# Patient Record
Sex: Male | Born: 1960 | Race: Black or African American | Hispanic: No | Marital: Single | Smoking: Current some day smoker
Health system: Southern US, Community
[De-identification: ages and names within clinical notes are randomized; demographics above are authoritative.]

## PROBLEM LIST (undated history)

## (undated) DIAGNOSIS — I10 Essential (primary) hypertension: Secondary | ICD-10-CM

## (undated) DIAGNOSIS — R7303 Prediabetes: Secondary | ICD-10-CM

## (undated) DIAGNOSIS — F32A Depression, unspecified: Secondary | ICD-10-CM

## (undated) DIAGNOSIS — F17200 Nicotine dependence, unspecified, uncomplicated: Secondary | ICD-10-CM

## (undated) DIAGNOSIS — F329 Major depressive disorder, single episode, unspecified: Secondary | ICD-10-CM

## (undated) DIAGNOSIS — M1990 Unspecified osteoarthritis, unspecified site: Secondary | ICD-10-CM

## (undated) DIAGNOSIS — F419 Anxiety disorder, unspecified: Secondary | ICD-10-CM

## (undated) DIAGNOSIS — J449 Chronic obstructive pulmonary disease, unspecified: Secondary | ICD-10-CM

## (undated) DIAGNOSIS — J302 Other seasonal allergic rhinitis: Secondary | ICD-10-CM

## (undated) DIAGNOSIS — K219 Gastro-esophageal reflux disease without esophagitis: Secondary | ICD-10-CM

## (undated) DIAGNOSIS — F1910 Other psychoactive substance abuse, uncomplicated: Secondary | ICD-10-CM

## (undated) HISTORY — DX: Essential (primary) hypertension: I10

## (undated) HISTORY — PX: HIP SURGERY: SHX245

## (undated) HISTORY — DX: Other seasonal allergic rhinitis: J30.2

## (undated) HISTORY — PX: SHOULDER ARTHROSCOPY: SHX128

## (undated) HISTORY — DX: Prediabetes: R73.03
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
## 2002-10-13 ENCOUNTER — Encounter: Payer: Self-pay | Admitting: Emergency Medicine

## 2002-10-13 ENCOUNTER — Emergency Department (HOSPITAL_COMMUNITY): Admission: EM | Admit: 2002-10-13 | Discharge: 2002-10-13 | Payer: Self-pay | Admitting: Emergency Medicine

## 2002-10-28 DIAGNOSIS — J449 Chronic obstructive pulmonary disease, unspecified: Secondary | ICD-10-CM

## 2002-10-28 HISTORY — DX: Chronic obstructive pulmonary disease, unspecified: J44.9

## 2002-11-24 ENCOUNTER — Encounter: Payer: Self-pay | Admitting: *Deleted

## 2002-11-24 ENCOUNTER — Emergency Department (HOSPITAL_COMMUNITY): Admission: EM | Admit: 2002-11-24 | Discharge: 2002-11-24 | Payer: Self-pay | Admitting: *Deleted

## 2003-06-16 ENCOUNTER — Encounter: Payer: Self-pay | Admitting: Internal Medicine

## 2003-06-16 ENCOUNTER — Encounter: Payer: Self-pay | Admitting: Emergency Medicine

## 2003-06-16 ENCOUNTER — Inpatient Hospital Stay (HOSPITAL_COMMUNITY): Admission: EM | Admit: 2003-06-16 | Discharge: 2003-06-20 | Payer: Self-pay | Admitting: Emergency Medicine

## 2003-06-17 ENCOUNTER — Encounter: Payer: Self-pay | Admitting: Internal Medicine

## 2004-12-11 ENCOUNTER — Emergency Department (HOSPITAL_COMMUNITY): Admission: EM | Admit: 2004-12-11 | Discharge: 2004-12-11 | Payer: Self-pay | Admitting: Emergency Medicine

## 2007-02-16 ENCOUNTER — Emergency Department (HOSPITAL_COMMUNITY): Admission: EM | Admit: 2007-02-16 | Discharge: 2007-02-17 | Payer: Self-pay | Admitting: Emergency Medicine

## 2007-02-16 IMAGING — CR DG WRIST COMPLETE 3+V*R*
3 series · 3 of 3 positions shown · non-contrast
Comparison: none

CLINICAL DATA: Injury. 
 RIGHT WRIST ? 4 VIEW ([DATE] HOURS):

[view not recorded (1 of 3)]
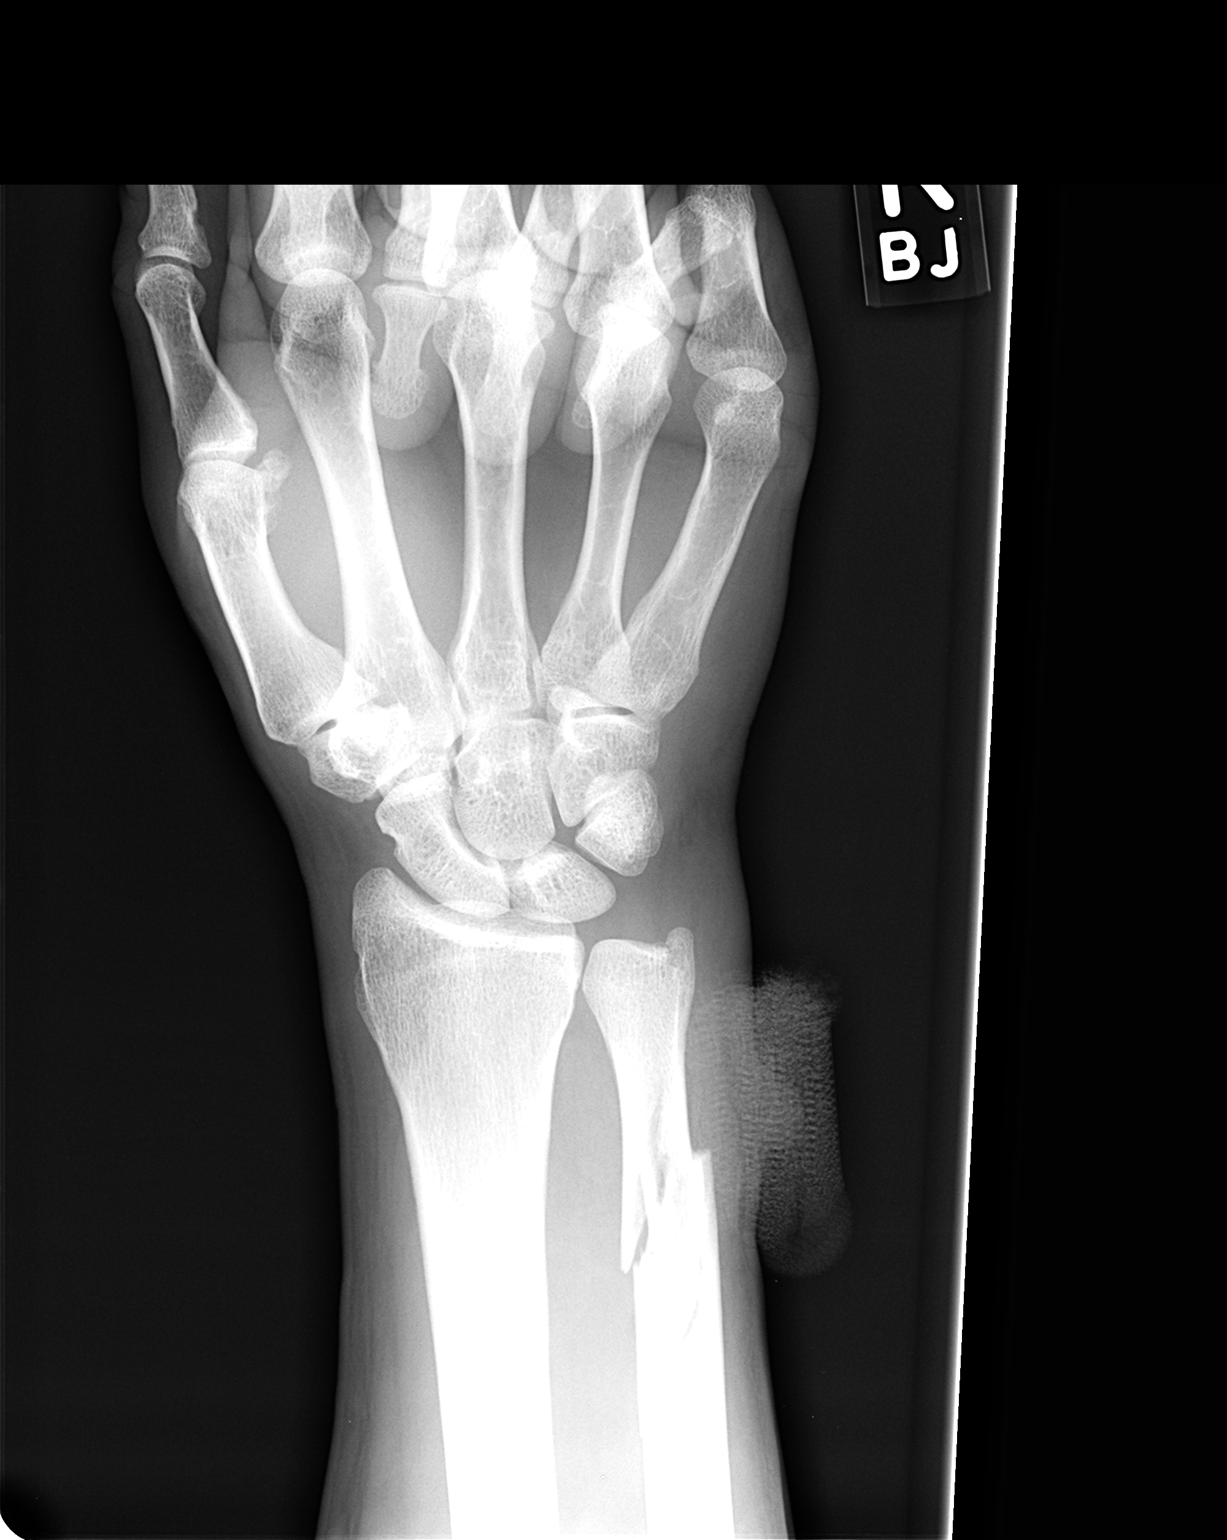

[view not recorded (2 of 3)]
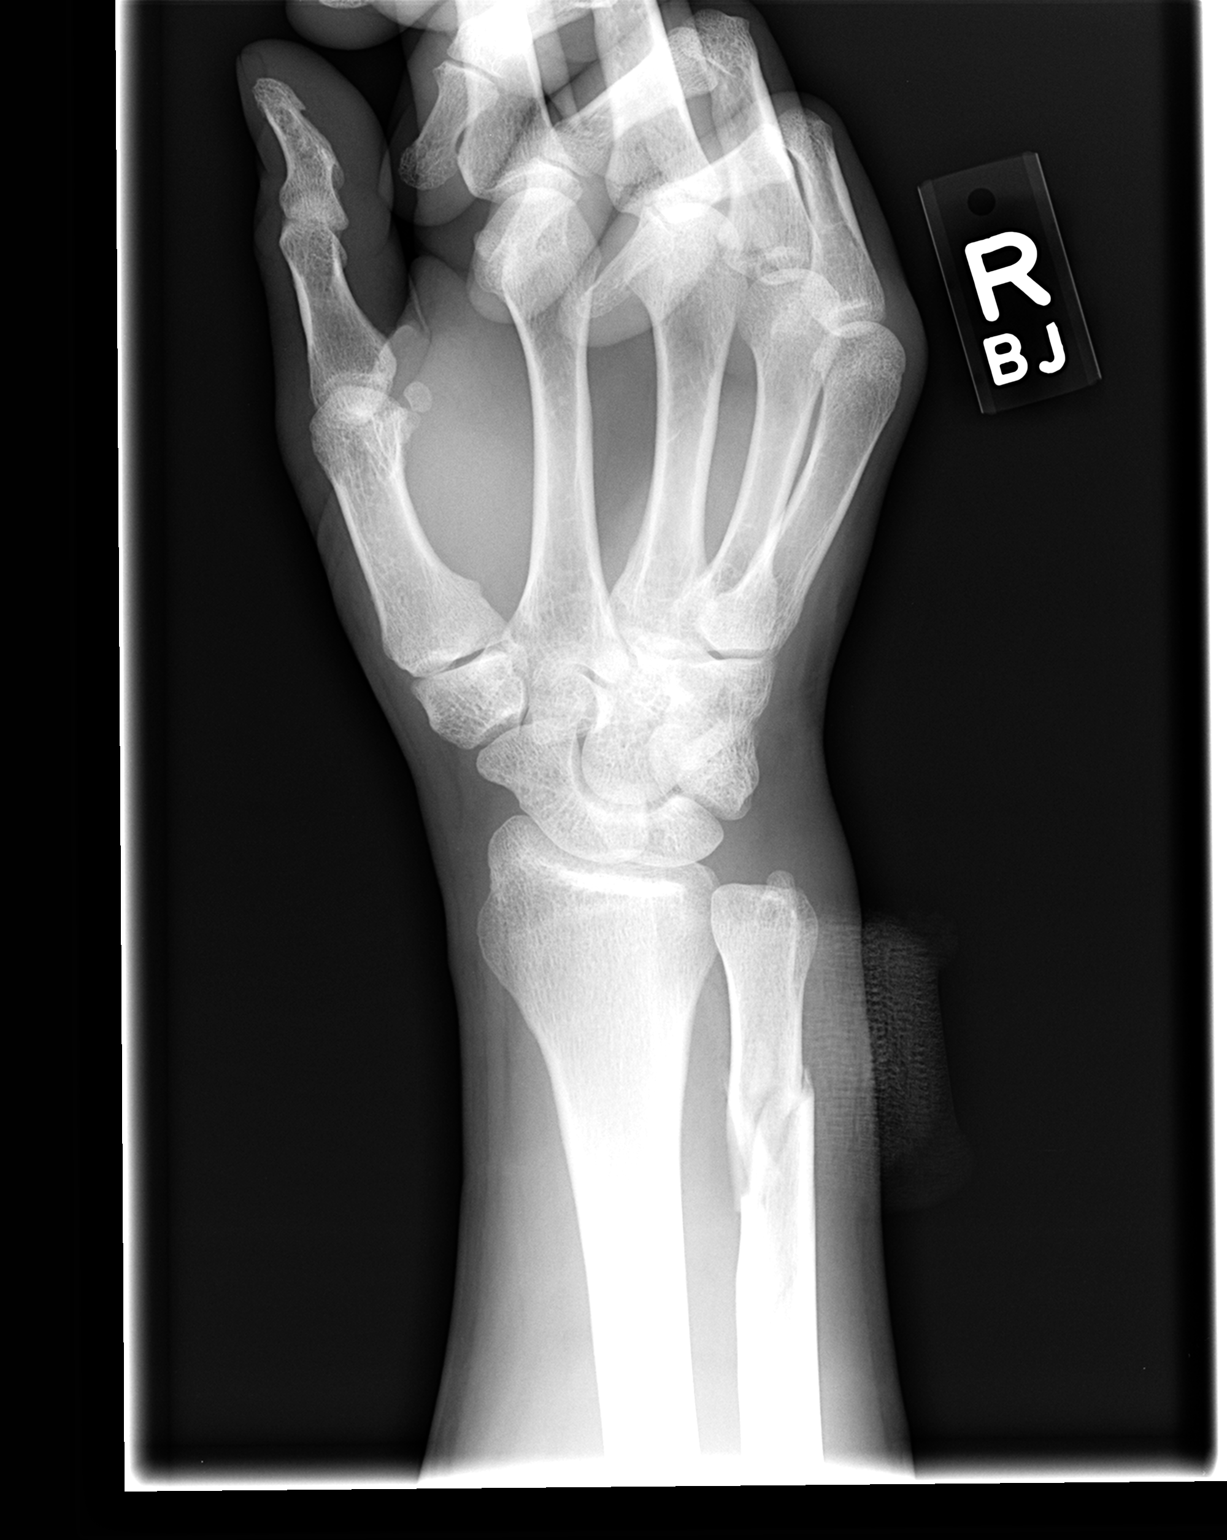

[view not recorded (3 of 3)]
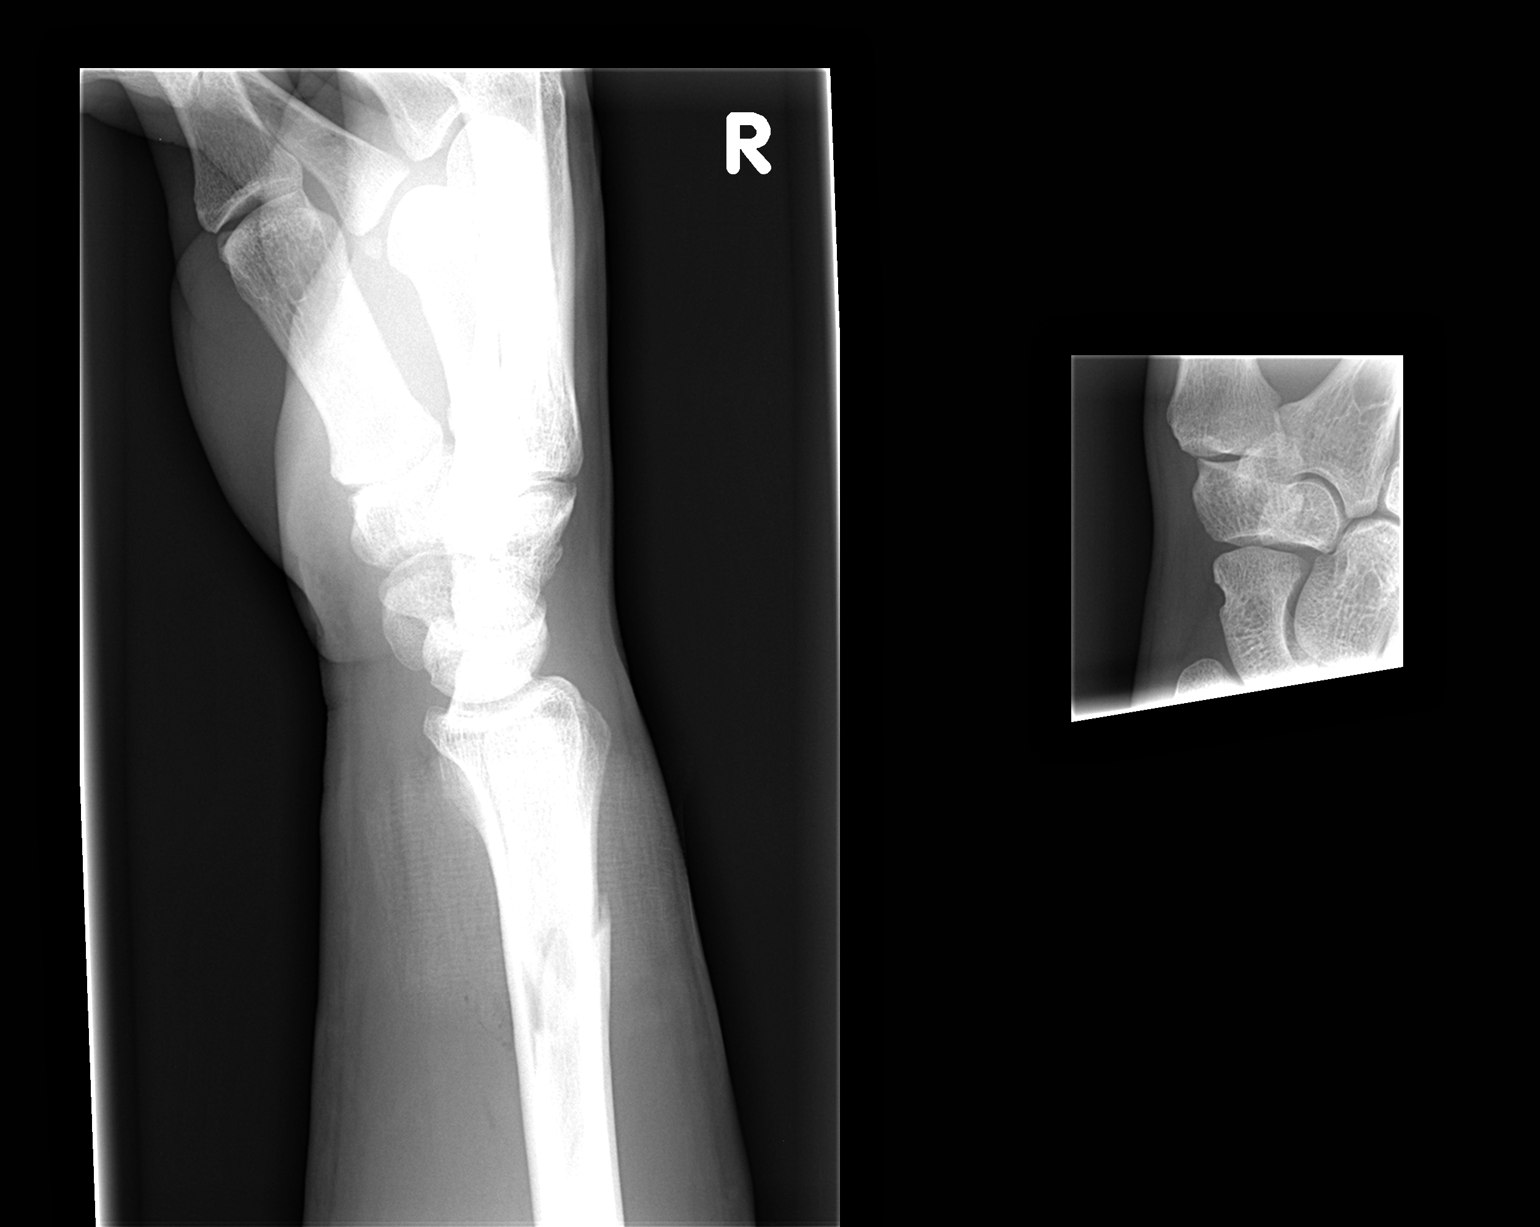

[3 of 3 positions shown; findings below may reference images not displayed]

FINDINGS: There is a comminuted fracture of the distal ulna at the diaphyseal metaphyseal junction.  A small erosion is present on the radial aspect of the scaphoid which has a chronic appearance.  The radius is intact.  No other acute fractures or dislocations are seen.
IMPRESSION: Comminuted ulna fracture.

## 2007-02-19 ENCOUNTER — Emergency Department (HOSPITAL_COMMUNITY): Admission: EM | Admit: 2007-02-19 | Discharge: 2007-02-19 | Payer: Self-pay | Admitting: Family Medicine

## 2007-02-24 ENCOUNTER — Emergency Department (HOSPITAL_COMMUNITY): Admission: EM | Admit: 2007-02-24 | Discharge: 2007-02-24 | Payer: Self-pay | Admitting: Orthopedic Surgery

## 2007-02-24 ENCOUNTER — Ambulatory Visit: Payer: Self-pay | Admitting: Orthopedic Surgery

## 2008-04-05 ENCOUNTER — Emergency Department (HOSPITAL_COMMUNITY): Admission: EM | Admit: 2008-04-05 | Discharge: 2008-04-05 | Payer: Self-pay | Admitting: Emergency Medicine

## 2008-04-05 IMAGING — CR DG CHEST 2V
2 series · 2 of 2 positions shown · non-contrast
Comparison: None.

CLINICAL DATA: 46-year-old male chest pain, hypertension,
emphysema, shortness of breath and history smoking.

CHEST - 2 VIEW

[view not recorded (1 of 2)]
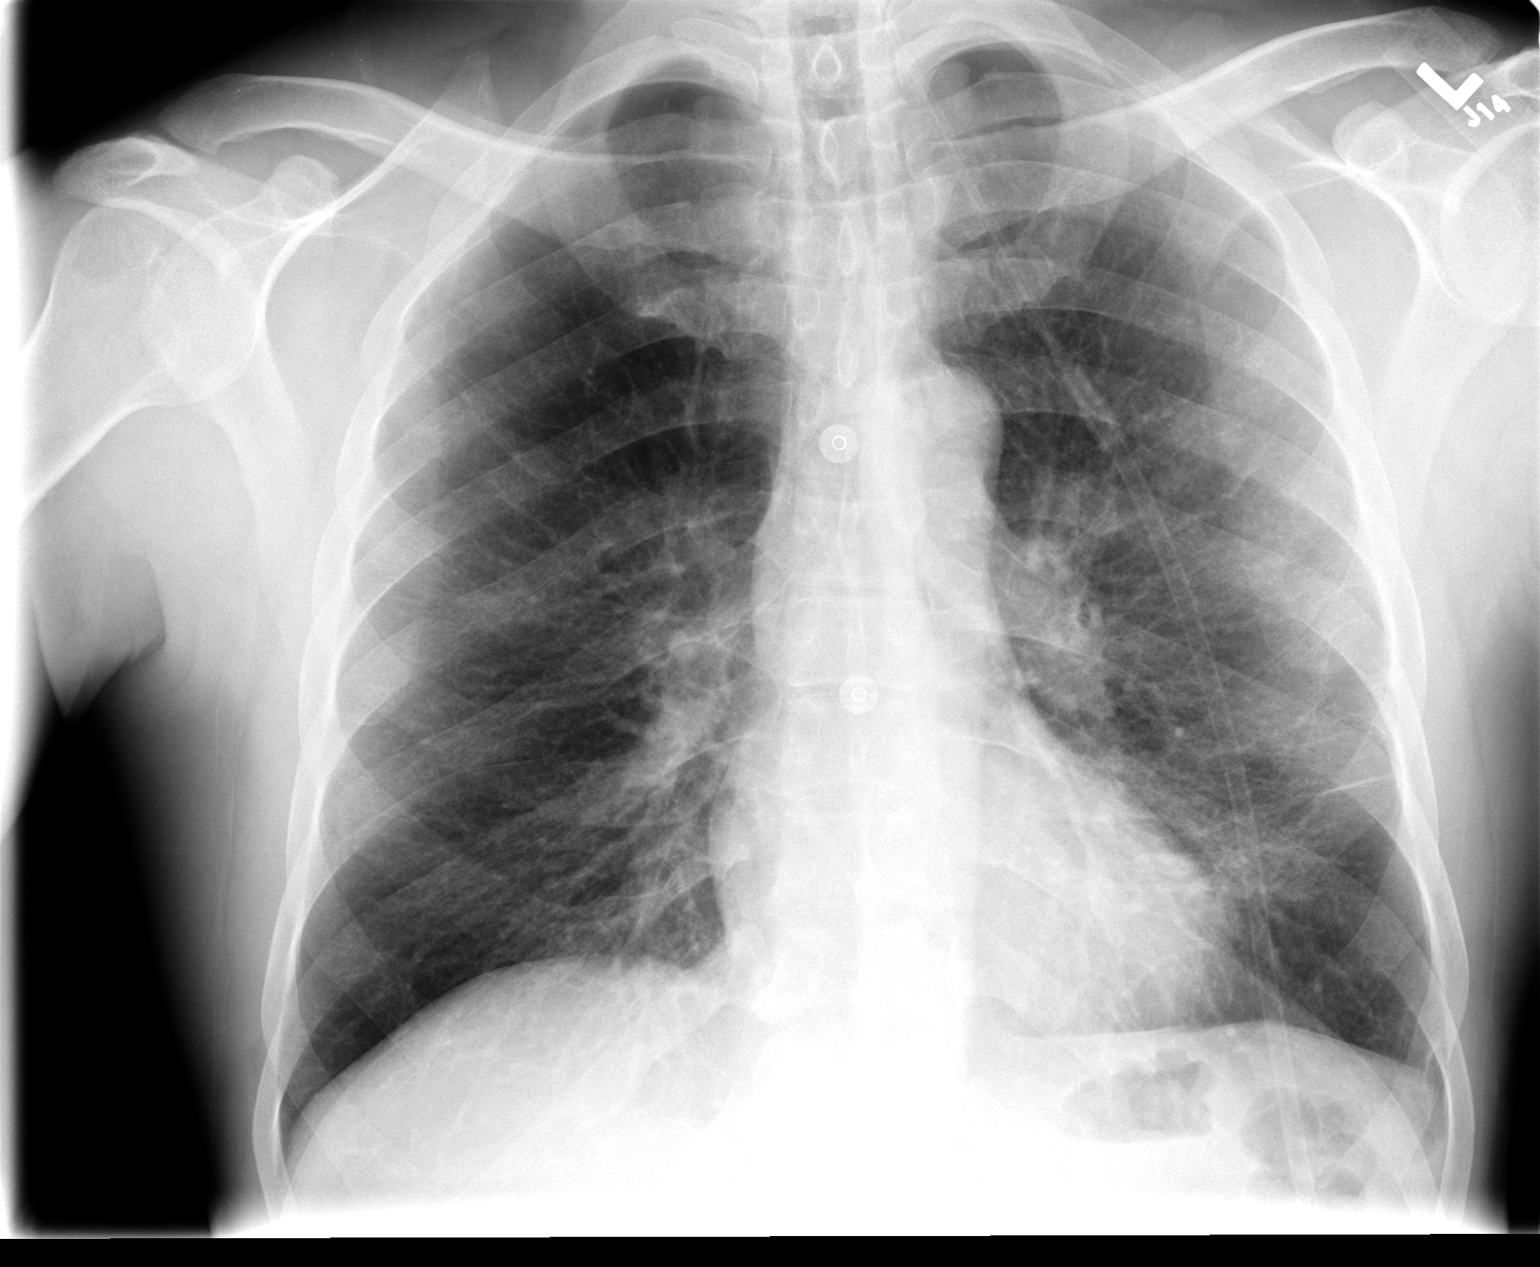

[view not recorded (2 of 2)]
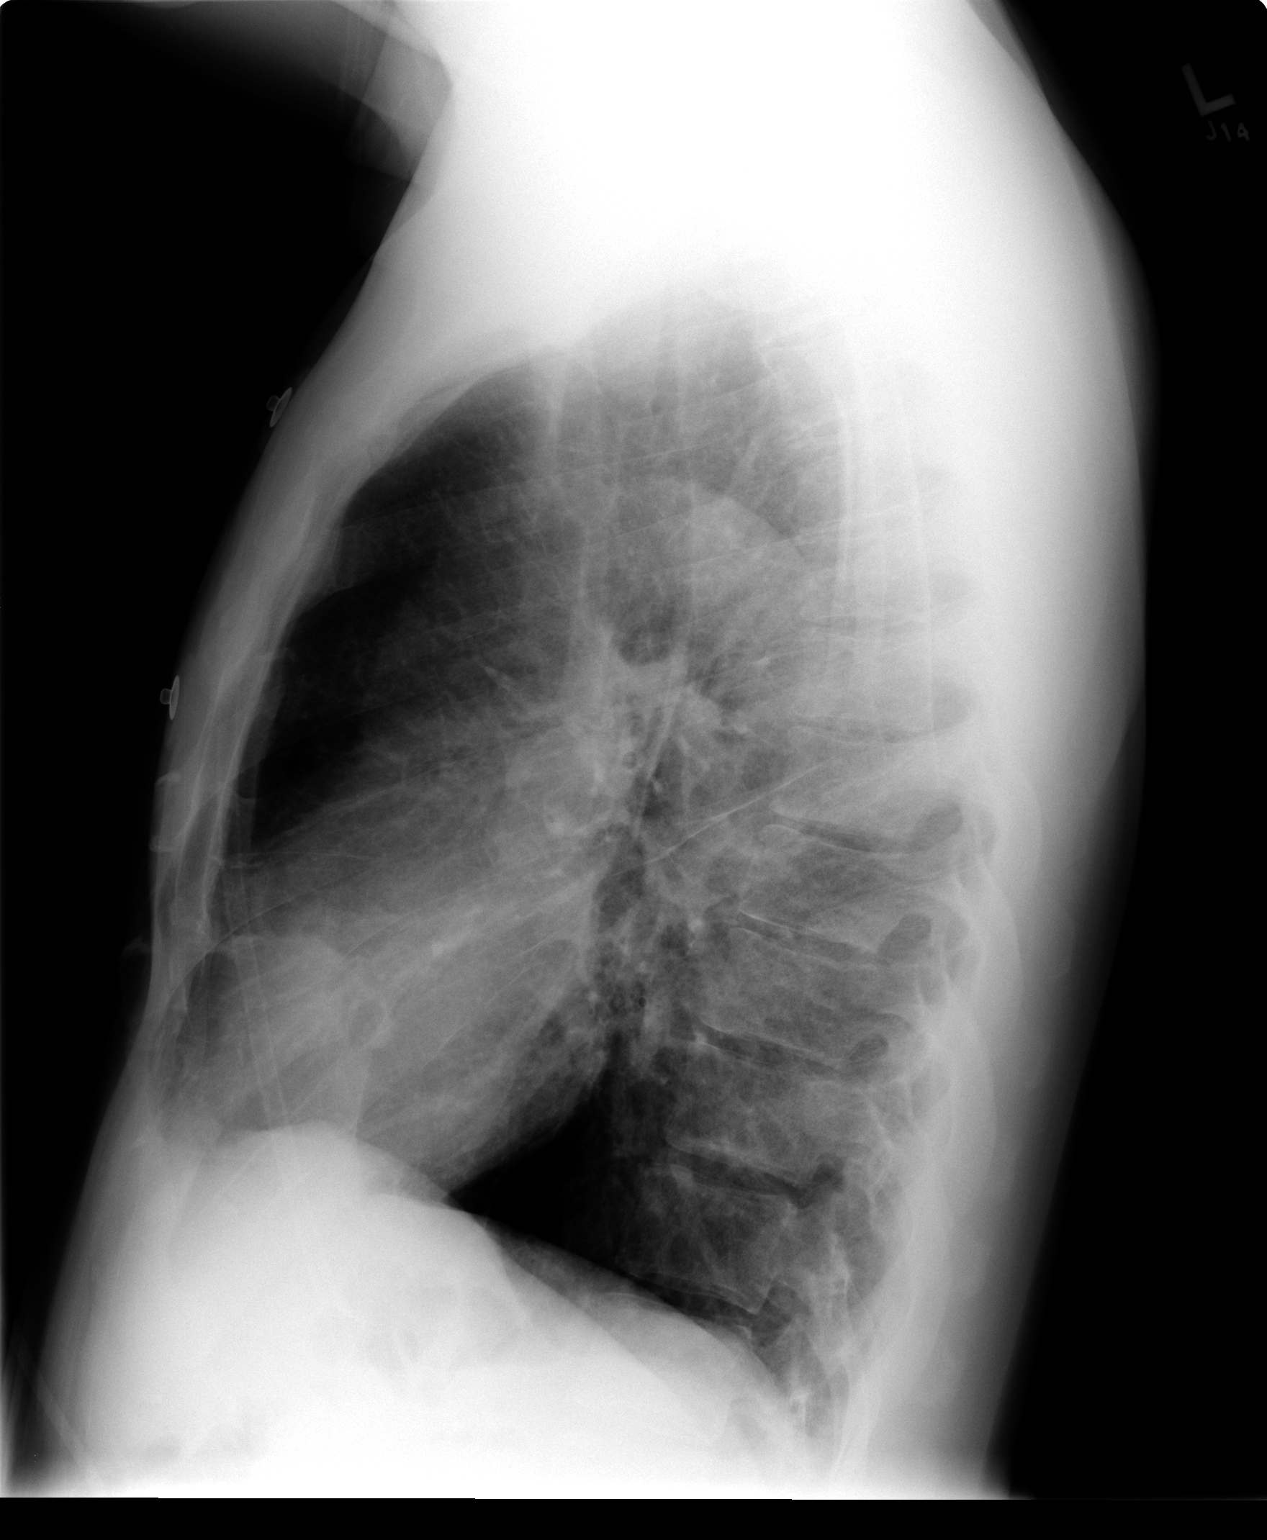

[2 of 2 positions shown; findings below may reference images not displayed]

FINDINGS: Ill-defined patchy increased density is present in the
lingula suspicious for atelectasis versus developing infiltrate or
pneumonia.  Right lung appears clear.  No effusion and or
pneumothorax.  Trachea is midline.  Normal heart size and
vascularity. There is mild hyperinflation of the chest.  Left
midlung subpleural scarring is also noted.a
IMPRESSION: Lingula atelectasis verses airspace disease.

Mild hyperinflation suspicious for COPD.

Left midlung scarring.

## 2009-01-23 ENCOUNTER — Emergency Department (HOSPITAL_COMMUNITY): Admission: EM | Admit: 2009-01-23 | Discharge: 2009-01-23 | Payer: Self-pay | Admitting: Emergency Medicine

## 2009-03-20 ENCOUNTER — Emergency Department (HOSPITAL_COMMUNITY): Admission: EM | Admit: 2009-03-20 | Discharge: 2009-03-20 | Payer: Self-pay | Admitting: Emergency Medicine

## 2009-03-20 IMAGING — CT CT HEAD W/O CM
1 series · 16 of 30 positions shown, 20 images · non-contrast
Comparison: None available.

CLINICAL DATA: Headache.

CT HEAD WITHOUT CONTRAST
TECHNIQUE: Contiguous axial images were obtained from the base of
the skull through the vertex without contrast.

[Series 2: headseq 4.8 h37s · axial · 0.43mm/px · z∈[+1199,+1359]mm · 16 of 36 slices shown, 20 images]
[im 2/36  brain]
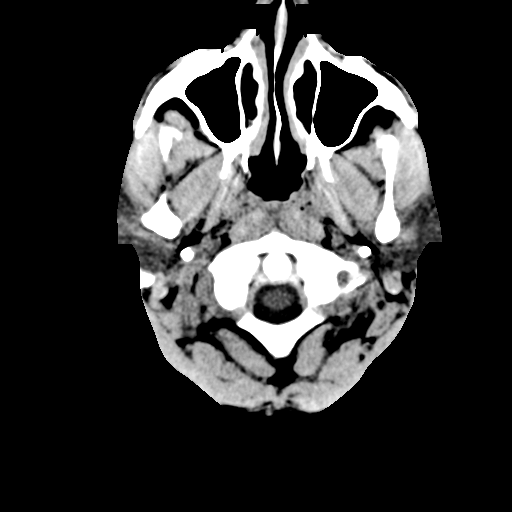
[im 2/36  bone]
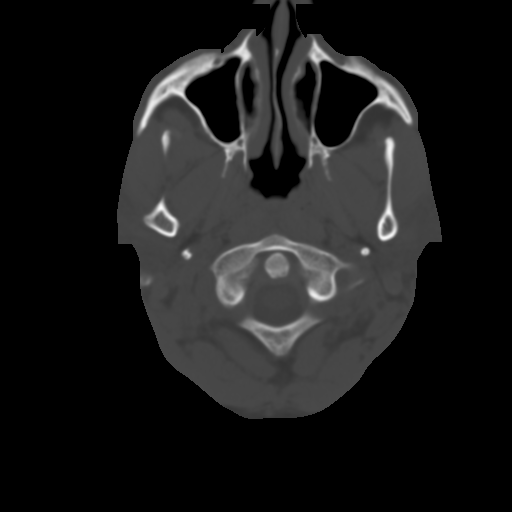
[im 4/36  brain]
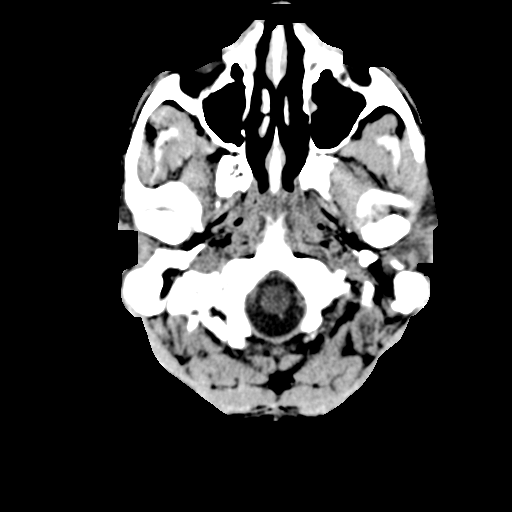
[im 7/36  brain]
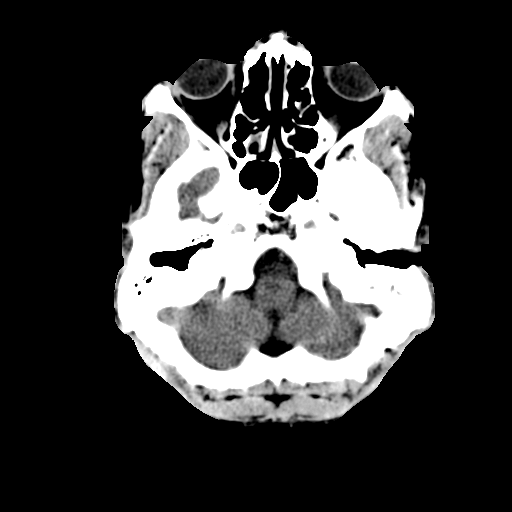
[im 9/36  brain]
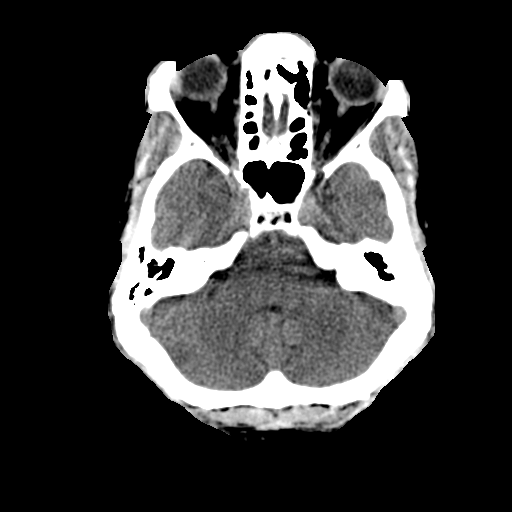
[im 10/36  brain]
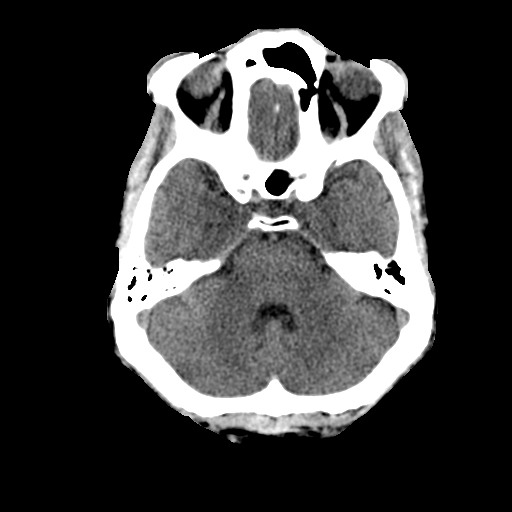
[im 10/36  bone]
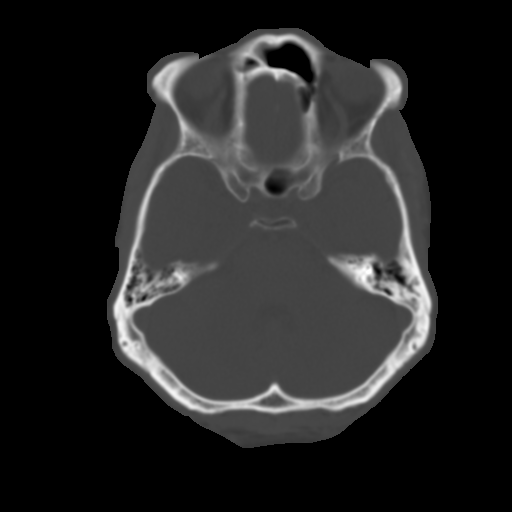
[im 13/36  brain]
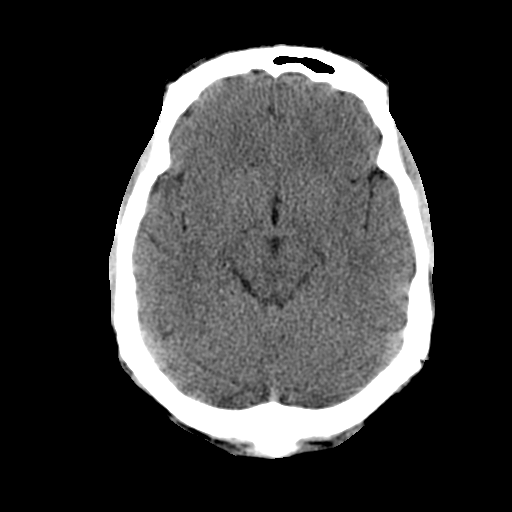
[im 15/36  brain]
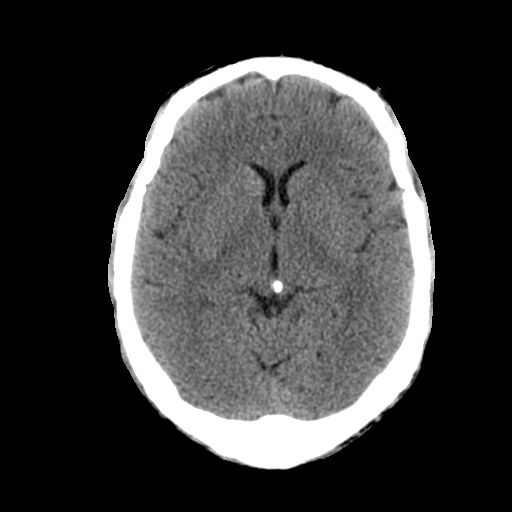
[im 17/36  brain]
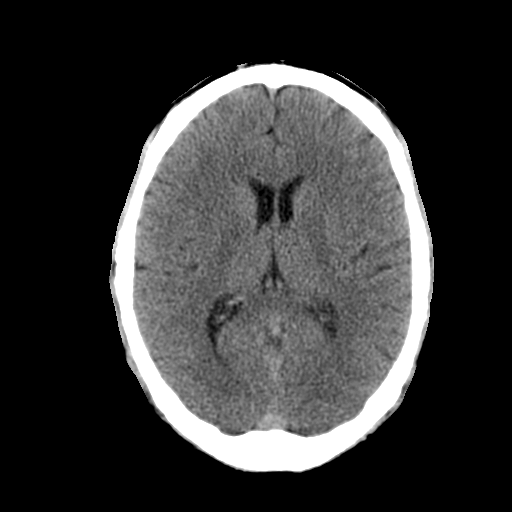
[im 19/36  brain]
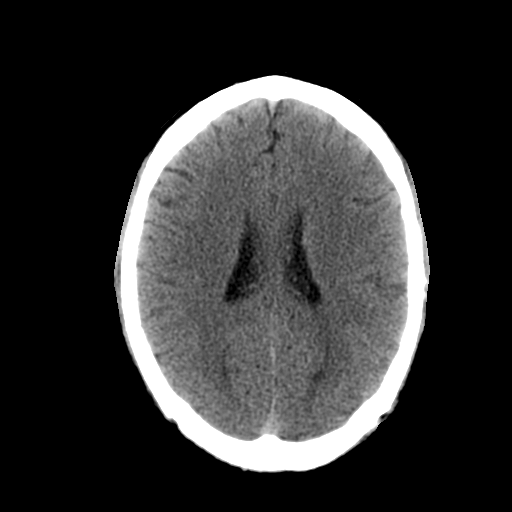
[im 19/36  bone]
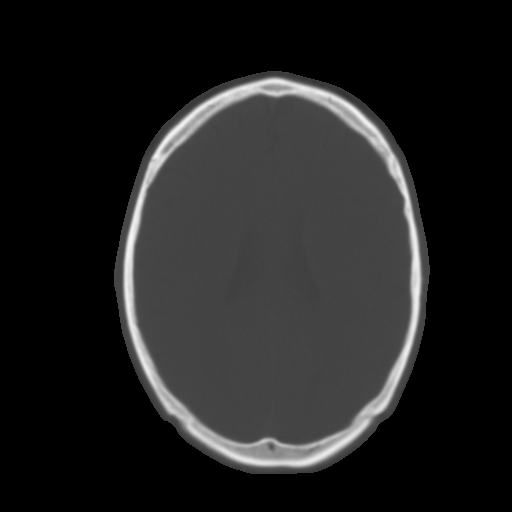
[im 21/36  brain]
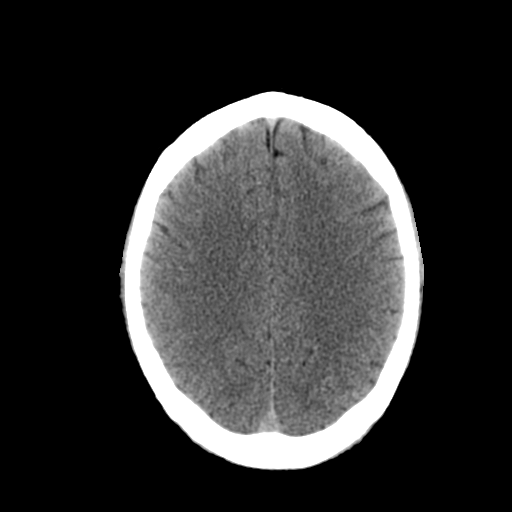
[im 23/36  brain]
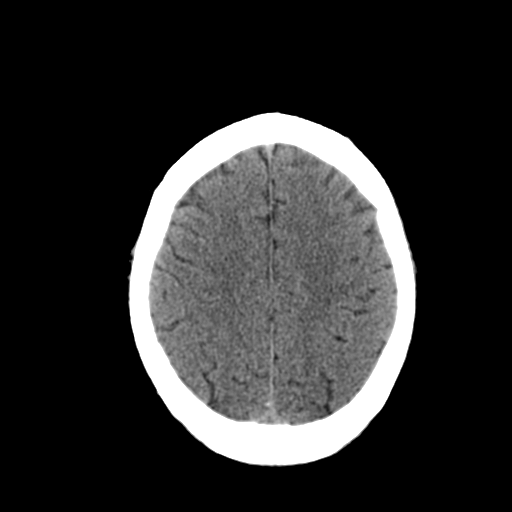
[im 26/36  brain]
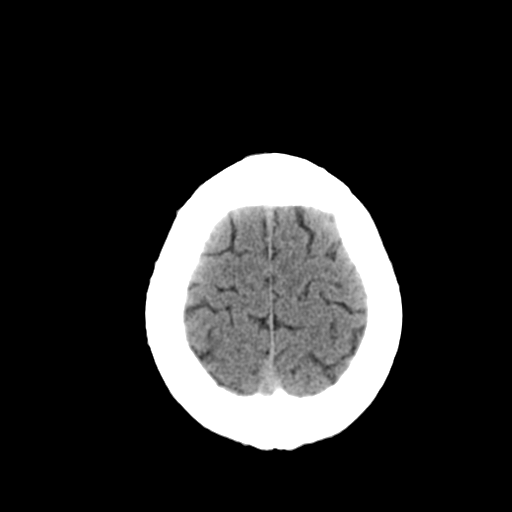
[im 27/36  brain]
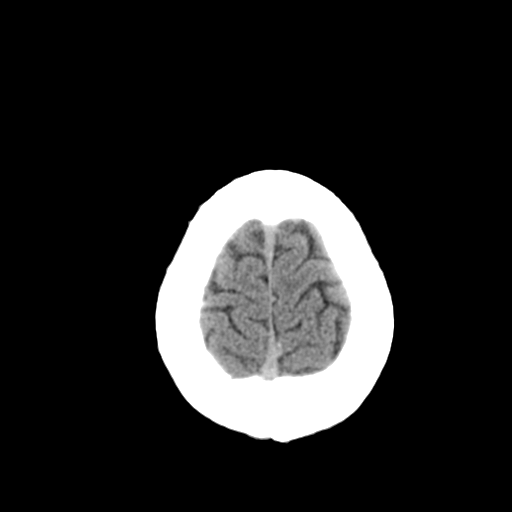
[im 27/36  bone]
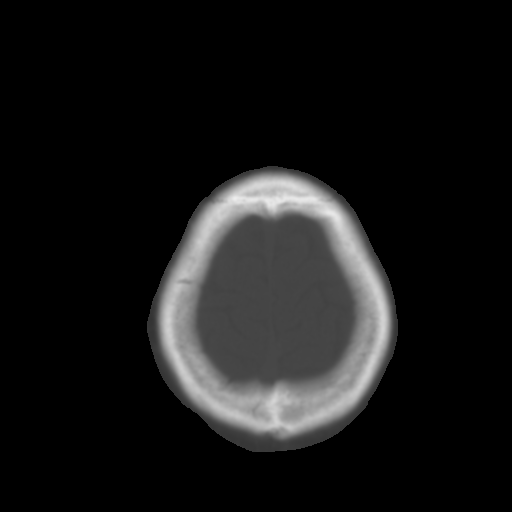
[im 29/36  brain]
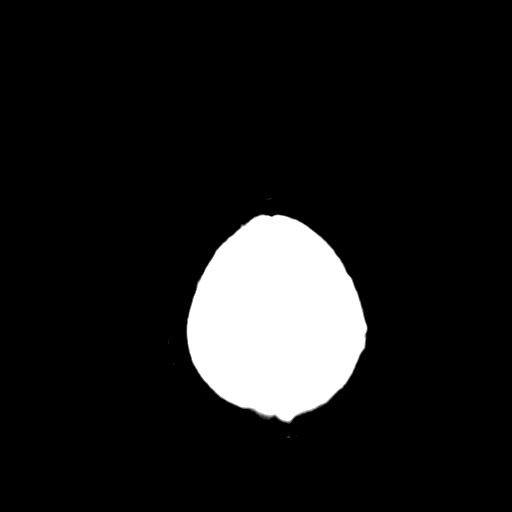
[im 32/36  brain]
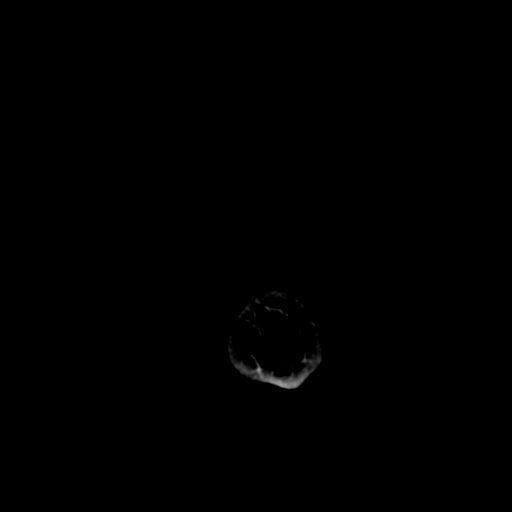
[im 34/36  brain]
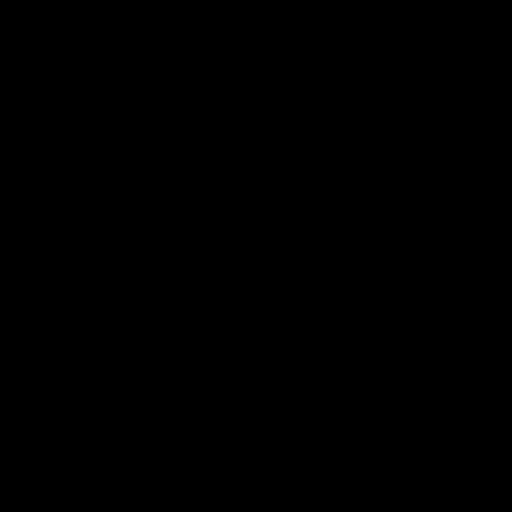

[16 of 30 positions shown; findings below may reference images not displayed]

FINDINGS: The brain appears normal without evidence of acute
infarction, hemorrhage, mass lesion, mass effect, midline shift or
abnormal extra-axial fluid collection.  No hydrocephalus.
Scattered ethmoid air cell disease noted.
IMPRESSION: No acute intracranial abnormality.

## 2009-08-19 ENCOUNTER — Emergency Department (HOSPITAL_COMMUNITY): Admission: EM | Admit: 2009-08-19 | Discharge: 2009-08-19 | Payer: Self-pay | Admitting: Emergency Medicine

## 2009-08-19 IMAGING — CR DG CHEST 2V
2 series · 2 of 2 positions shown · non-contrast
Comparison: [DATE]

CLINICAL DATA: Shortness of breath, cough, congestion, fever,
history emphysema

CHEST - 2 VIEW

[view not recorded (1 of 2)]
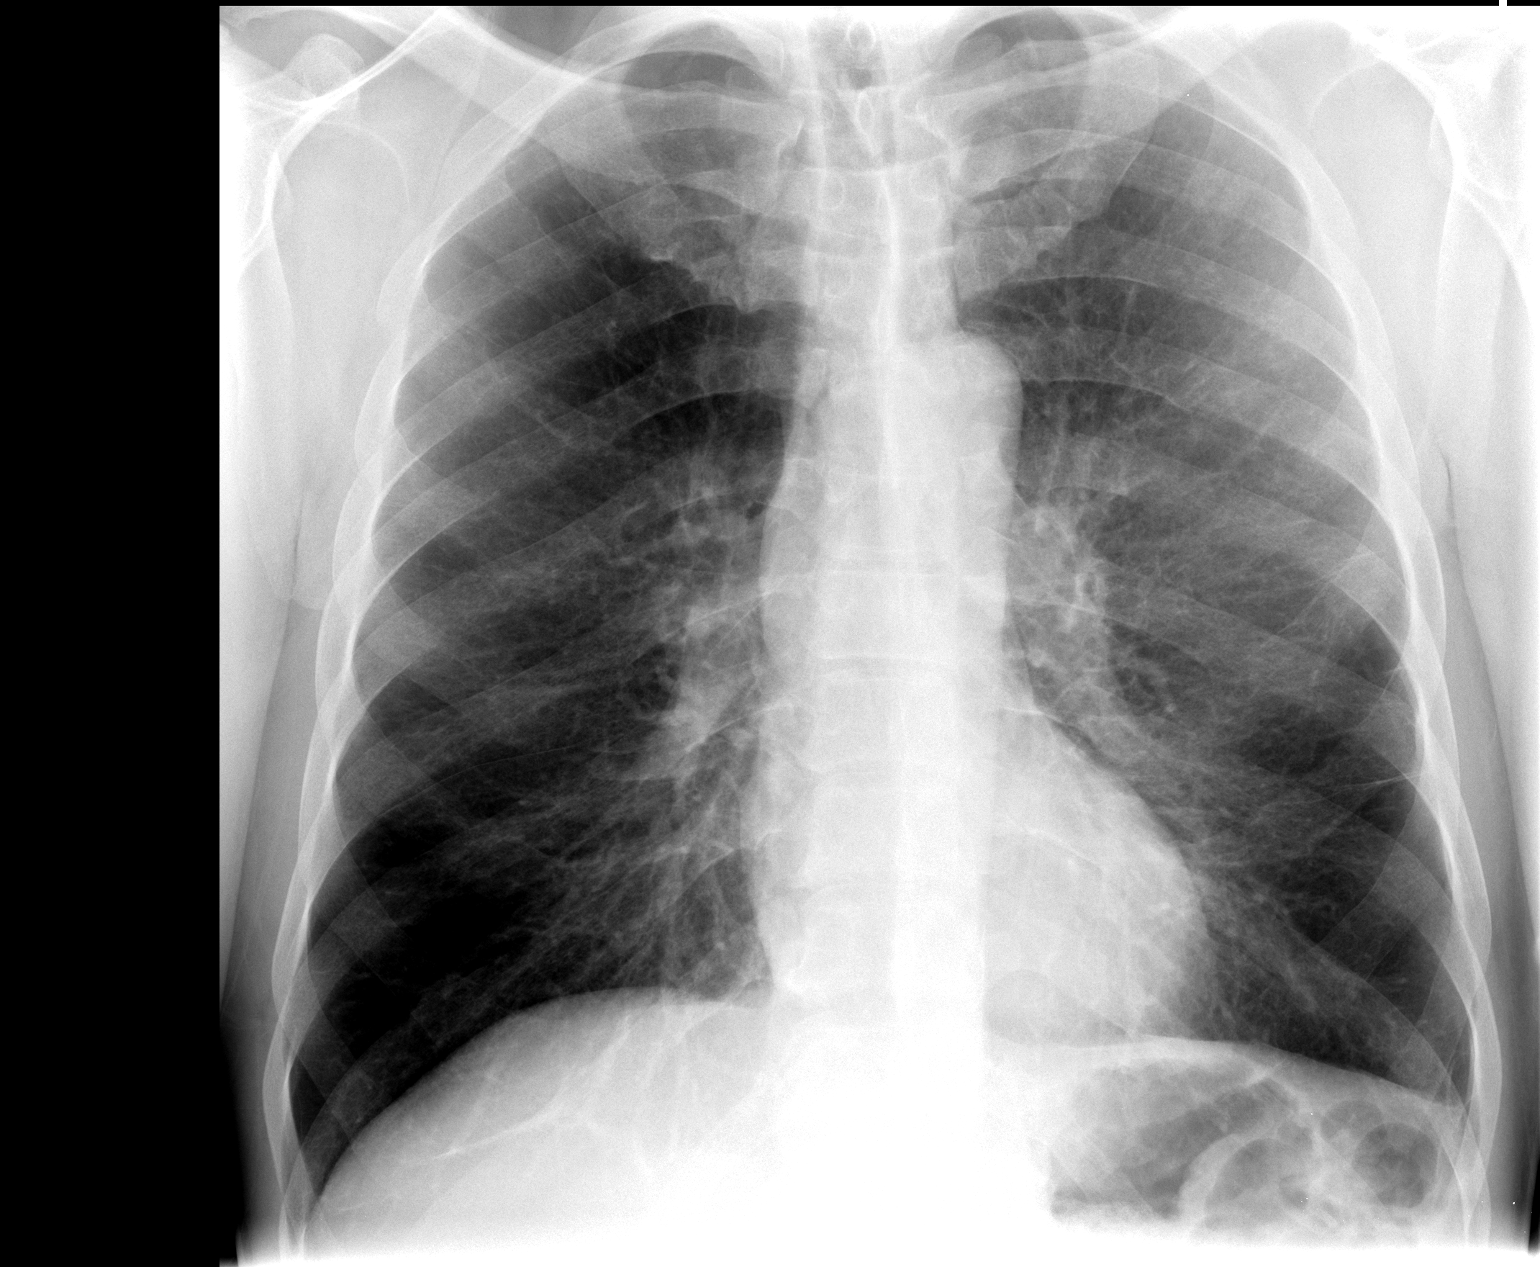

[view not recorded (2 of 2)]
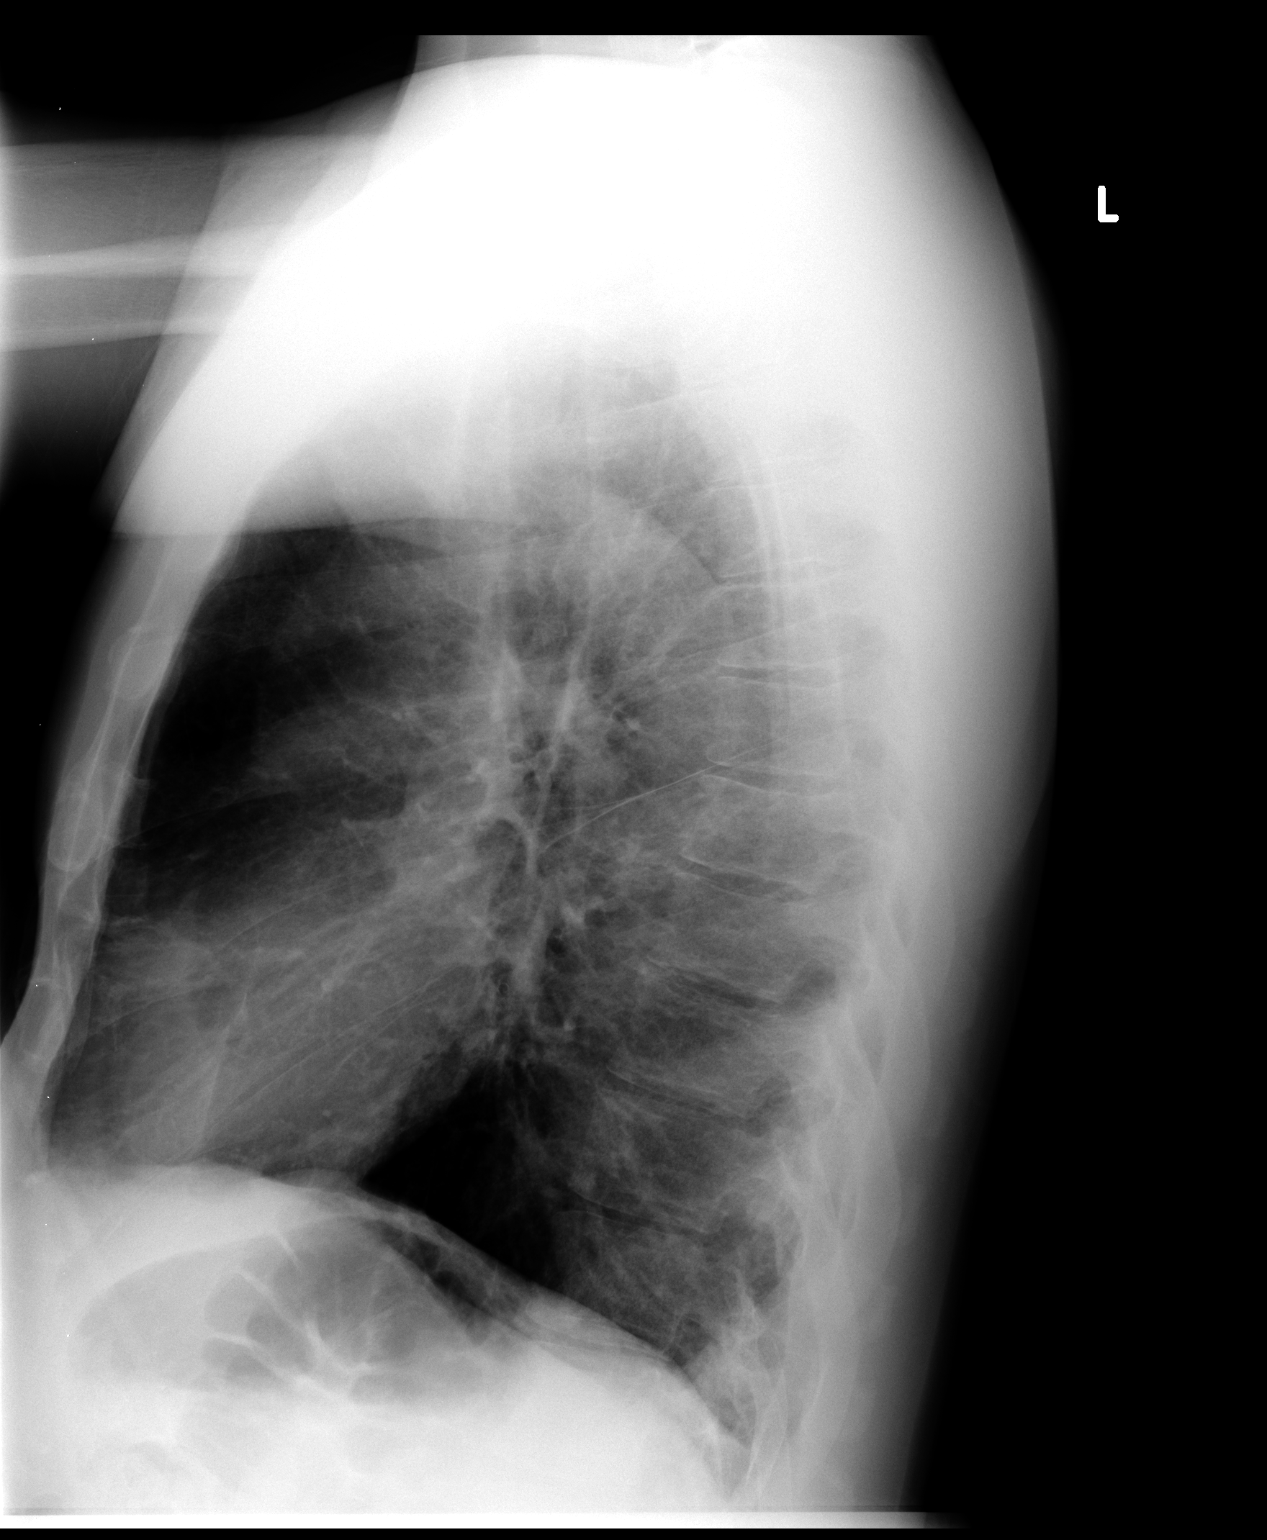

[2 of 2 positions shown; findings below may reference images not displayed]

FINDINGS: Normal heart size, mediastinal contours, and pulmonary vascularity.
Emphysematous and mild chronic interstitial changes stable.
No pulmonary infiltrate, pleural effusion, or pneumothorax.
Bones unremarkable.
IMPRESSION: COPD with minimal chronic interstitial prominence.
No acute abnormalities.

## 2011-01-31 LAB — BASIC METABOLIC PANEL
BUN: 8 mg/dL (ref 6–23)
CO2: 29 mEq/L (ref 19–32)
Calcium: 9.2 mg/dL (ref 8.4–10.5)
Chloride: 105 mEq/L (ref 96–112)
Creatinine, Ser: 0.96 mg/dL (ref 0.4–1.5)
GFR calc non Af Amer: >60 mL/min (ref >60)
Glucose, Bld: 112 mg/dL — ABNORMAL HIGH (ref 70–99)
Potassium: 3.6 mEq/L (ref 3.5–5.1)
Sodium: 141 mEq/L (ref 135–145)

## 2011-01-31 LAB — DIFFERENTIAL
Basophils Absolute: 0 10*3/uL (ref 0.0–0.1)
Basophils Relative: 0 % (ref 0–1)
Eosinophils Absolute: 0.1 10*3/uL (ref 0.0–0.7)
Eosinophils Relative: 1 % (ref 0–5)
Lymphocytes Relative: 10 % — ABNORMAL LOW (ref 12–46)
Lymphs Abs: 1.9 10*3/uL (ref 0.7–4.0)
Monocytes Absolute: 0.4 10*3/uL (ref 0.1–1.0)
Monocytes Relative: 2 % — ABNORMAL LOW (ref 3–12)
Neutro Abs: 16.3 10*3/uL — ABNORMAL HIGH (ref 1.7–7.7)
Neutrophils Relative %: 87 % — ABNORMAL HIGH (ref 43–77)

## 2011-01-31 LAB — POCT CARDIAC MARKERS
CKMB, poc: <1 ng/mL — ABNORMAL LOW (ref 1.0–8.0)
Myoglobin, poc: 35.7 ng/mL (ref 12–200)
Troponin i, poc: <0.05 ng/mL (ref 0.00–0.09)

## 2011-01-31 LAB — CBC
HCT: 49.7 % (ref 39.0–52.0)
Hemoglobin: 16.7 g/dL (ref 13.0–17.0)
MCHC: 33.6 g/dL (ref 30.0–36.0)
MCV: 98.6 fL (ref 78.0–100.0)
Platelets: 269 10*3/uL (ref 150–400)
RBC: 5.04 MIL/uL (ref 4.22–5.81)
RDW: 13.3 % (ref 11.5–15.5)
WBC: 18.8 10*3/uL — ABNORMAL HIGH (ref 4.0–10.5)
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
## 2011-03-15 NOTE — Discharge Summary (Signed)
NAME:  Tony Sullivan, Tony Sullivan                          ACCOUNT NO.:  [id number]   MEDICAL RECORD NO.:  [id number]                   PATIENT TYPE:  INP   LOCATION:  A226                                 FACILITY:  [facility]   PHYSICIAN:  Hanley Hays. Dechurch, M.D.           DATE OF BIRTH:  09-Dec-1960   DATE OF ADMISSION:  06/16/2003  DATE OF DISCHARGE:  06/20/2003                                 DISCHARGE SUMMARY   DIAGNOSES:  1. Suicidal ideation.  2. Substance abuse.  3. Para-pneumonic effusion, probable reactive.  4. Primary lymphocytic.   DISPOSITION:  The patient was discharged to [facility] to the  services of Dr. Excell Seltzer.  The patient was instructed to seek followup with  Dr. Delbert Harness, once he is discharged.  The patient is stable at the time of  discharge.   HOSPITAL COURSE:  The patient is a 50 year old African American gentleman  who came to the emergency room after a several day binge of crack cocaine.  He stated he felt worried that he might hurt himself.  He knew that his  habit needed fixing.  He stated that he felt evil things when he was doing  the drugs and fixated on suicidal ideation.  In order to get into the  emergency room, he said he had some pain in his chest, though this he  actually denied.  In the process of the workup, it was noted that the  patient had a left pleural effusion.  The patient was admitted to the  hospital for further evaluation.  CT scan revealed no significant change  compared with CT obtained in December 2003, when the patient was being  treated for an outpatient pneumonia.  He had no other symptomatology  including shortness of breath or hypoxemia, pain or other compromise.  He  underwent a diagnostic thoracentesis which revealed a lymphocytic fluid.  The cytology is pending at this time.  Cultures were negative.  LDH was 225  but glucose and protein were unremarkable.  The patient was seen in  consultation by psychiatry.  It was felt  that the patient would benefit from  an intensive inpatient program.  He was felt to be quite depressed.  Given  his cocaine dependence, he was being discharged to [facility] for  further care.  Followup recommendations were given to the patient, prior to  discharge.  He said he had every intention of pursuing.  He was discharged  via sheriff accompaniment in stable condition.   PHYSICAL EXAMINATION:  GENERAL:  At the time of discharge, reveals a thin,  well developed, black male, no distress.  LUNGS:  Diminished at the bases but clear without rales or rhonchi.  HEART:  Regular.  No murmur.  ABDOMEN:  Flat, soft, nontender.  EXTREMITIES:  Without clubbing, cyanosis or edema.  SKIN:  There is no skin rash, lesion or breakdown noted.   The laboratory  evaluation, in addition to above, revealed a nonreactive HIV  status.  TSH was 2.  Liver functions were normal.  Albumin was mildly  depleted at 3.2.  White count was initially at 14, but there was no other  evidence of infection.                                               Hanley Hays Josefine Class, M.D.    FED/MEDQ  D:  07/06/2003  T:  07/06/2003  Job:  [id number]   cc:   Delbert Harness, MD

## 2011-03-15 NOTE — Procedures (Signed)
   NAME:  Tony Sullivan, Tony Sullivan                          ACCOUNT NO.:  [id number]   MEDICAL RECORD NO.:  [id number]                   PATIENT TYPE:  EMS   LOCATION:  ED                                   FACILITY:  [facility]   PHYSICIAN:  Edward L. Juanetta Gosling, M.D.             DATE OF BIRTH:  1961/07/28   DATE OF PROCEDURE:  10/13/2002  DATE OF DISCHARGE:  10/13/2002                                EKG INTERPRETATION   DATE AND TIME OF TEST:  October 13, 2002 at 0832.   IMPRESSION:  The rhythm is sinus rhythm with a rate in the 70s.  There is  fourth degree AV block.  There is probable right atrial enlargement.  There  is an incomplete right bundle-branch block.  There is ST elevation which is  fairly diffuse and may indicate early repolarization but clinical  correlation is suggested.  Abnormal electrocardiogram.                                               Oneal Deputy. Juanetta Gosling, M.D.    ELH/MEDQ  D:  10/14/2002  T:  10/16/2002  Job:  [id number]

## 2011-07-25 LAB — CBC
HCT: 43.6
Hemoglobin: 14.9
MCHC: 34.2
MCV: 93.8
Platelets: 282
RBC: 4.65
RDW: 14.3
WBC: 18 — ABNORMAL HIGH

## 2011-07-25 LAB — BASIC METABOLIC PANEL
BUN: 5 — ABNORMAL LOW
CO2: 23
Calcium: 9.2
Chloride: 108
Creatinine, Ser: 1.12
GFR calc non Af Amer: >60
Glucose, Bld: 87
Potassium: 3.7
Sodium: 138

## 2011-07-25 LAB — DIFFERENTIAL
Basophils Absolute: 0
Basophils Relative: 0
Eosinophils Absolute: 0.1
Eosinophils Relative: 0
Lymphocytes Relative: 7 — ABNORMAL LOW
Lymphs Abs: 1.2
Monocytes Absolute: 0.8
Monocytes Relative: 4
Neutro Abs: 16 — ABNORMAL HIGH
Neutrophils Relative %: 89 — ABNORMAL HIGH

## 2011-07-25 LAB — POCT CARDIAC MARKERS
CKMB, poc: <1 — ABNORMAL LOW
Myoglobin, poc: 33.6
Operator id: 303571
Troponin i, poc: <0.05

## 2014-01-07 ENCOUNTER — Telehealth: Payer: Self-pay

## 2014-01-07 ENCOUNTER — Ambulatory Visit (INDEPENDENT_AMBULATORY_CARE_PROVIDER_SITE_OTHER)
Admission: RE | Admit: 2014-01-07 | Discharge: 2014-01-07 | Disposition: A | Discharge status: Home / Self Care | Payer: Medicaid Other | Source: Ambulatory Visit | Attending: Internal Medicine | Admitting: Internal Medicine

## 2014-01-07 ENCOUNTER — Encounter (INDEPENDENT_AMBULATORY_CARE_PROVIDER_SITE_OTHER): Payer: Self-pay

## 2014-01-07 ENCOUNTER — Encounter: Payer: Self-pay | Admitting: Internal Medicine

## 2014-01-07 ENCOUNTER — Ambulatory Visit (INDEPENDENT_AMBULATORY_CARE_PROVIDER_SITE_OTHER): Payer: Medicaid Other | Admitting: Internal Medicine

## 2014-01-07 VITALS — BP 134/86 | HR 85 | Temp 97.8°F | Ht 71.5 in | Wt 173.0 lb

## 2014-01-07 DIAGNOSIS — R05 Cough: Secondary | ICD-10-CM

## 2014-01-07 DIAGNOSIS — R059 Cough, unspecified: Secondary | ICD-10-CM

## 2014-01-07 DIAGNOSIS — J449 Chronic obstructive pulmonary disease, unspecified: Secondary | ICD-10-CM

## 2014-01-07 DIAGNOSIS — J9610 Chronic respiratory failure, unspecified whether with hypoxia or hypercapnia: Secondary | ICD-10-CM

## 2014-01-07 DIAGNOSIS — F17200 Nicotine dependence, unspecified, uncomplicated: Secondary | ICD-10-CM

## 2014-01-07 IMAGING — CR DG CHEST 2V
2 series · 2 of 2 positions shown · non-contrast
Comparison: DG CHEST 2V dated [DATE]

CLINICAL DATA: COPD.

EXAM:
CHEST  2 VIEW

[view not recorded (1 of 2)]
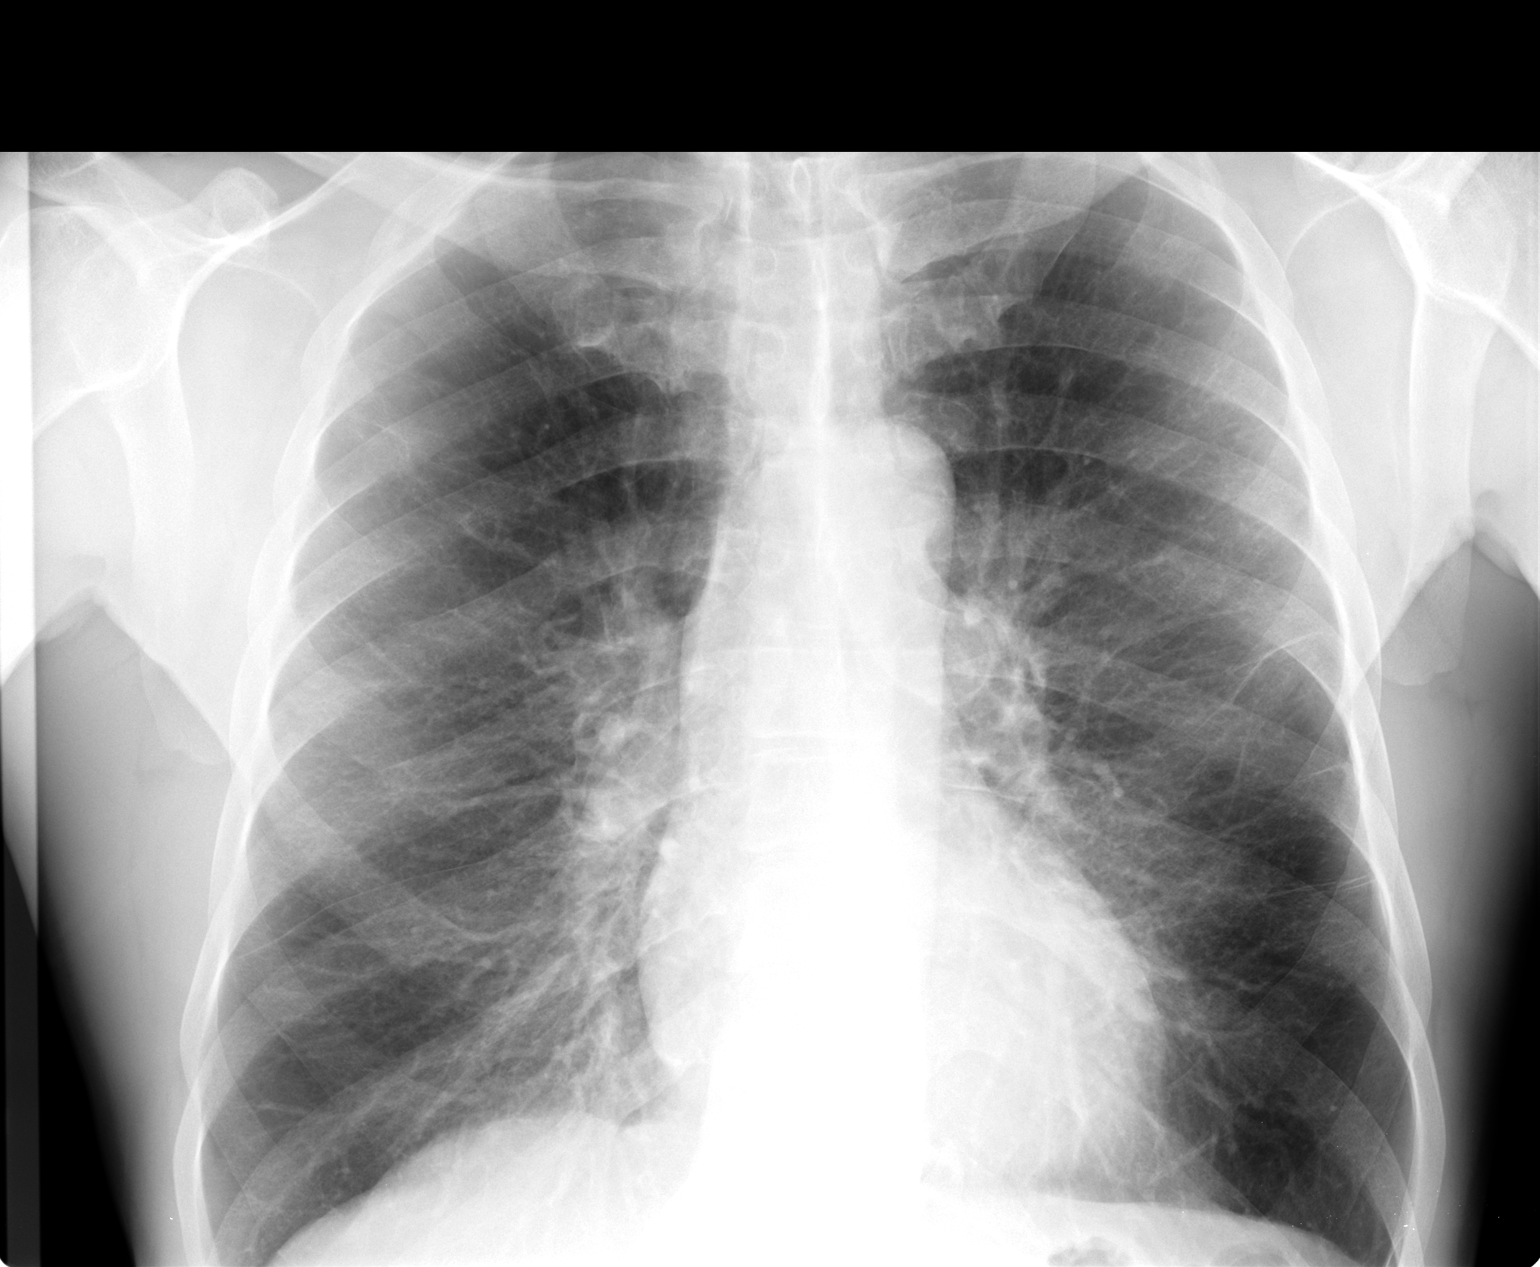

[view not recorded (2 of 2)]
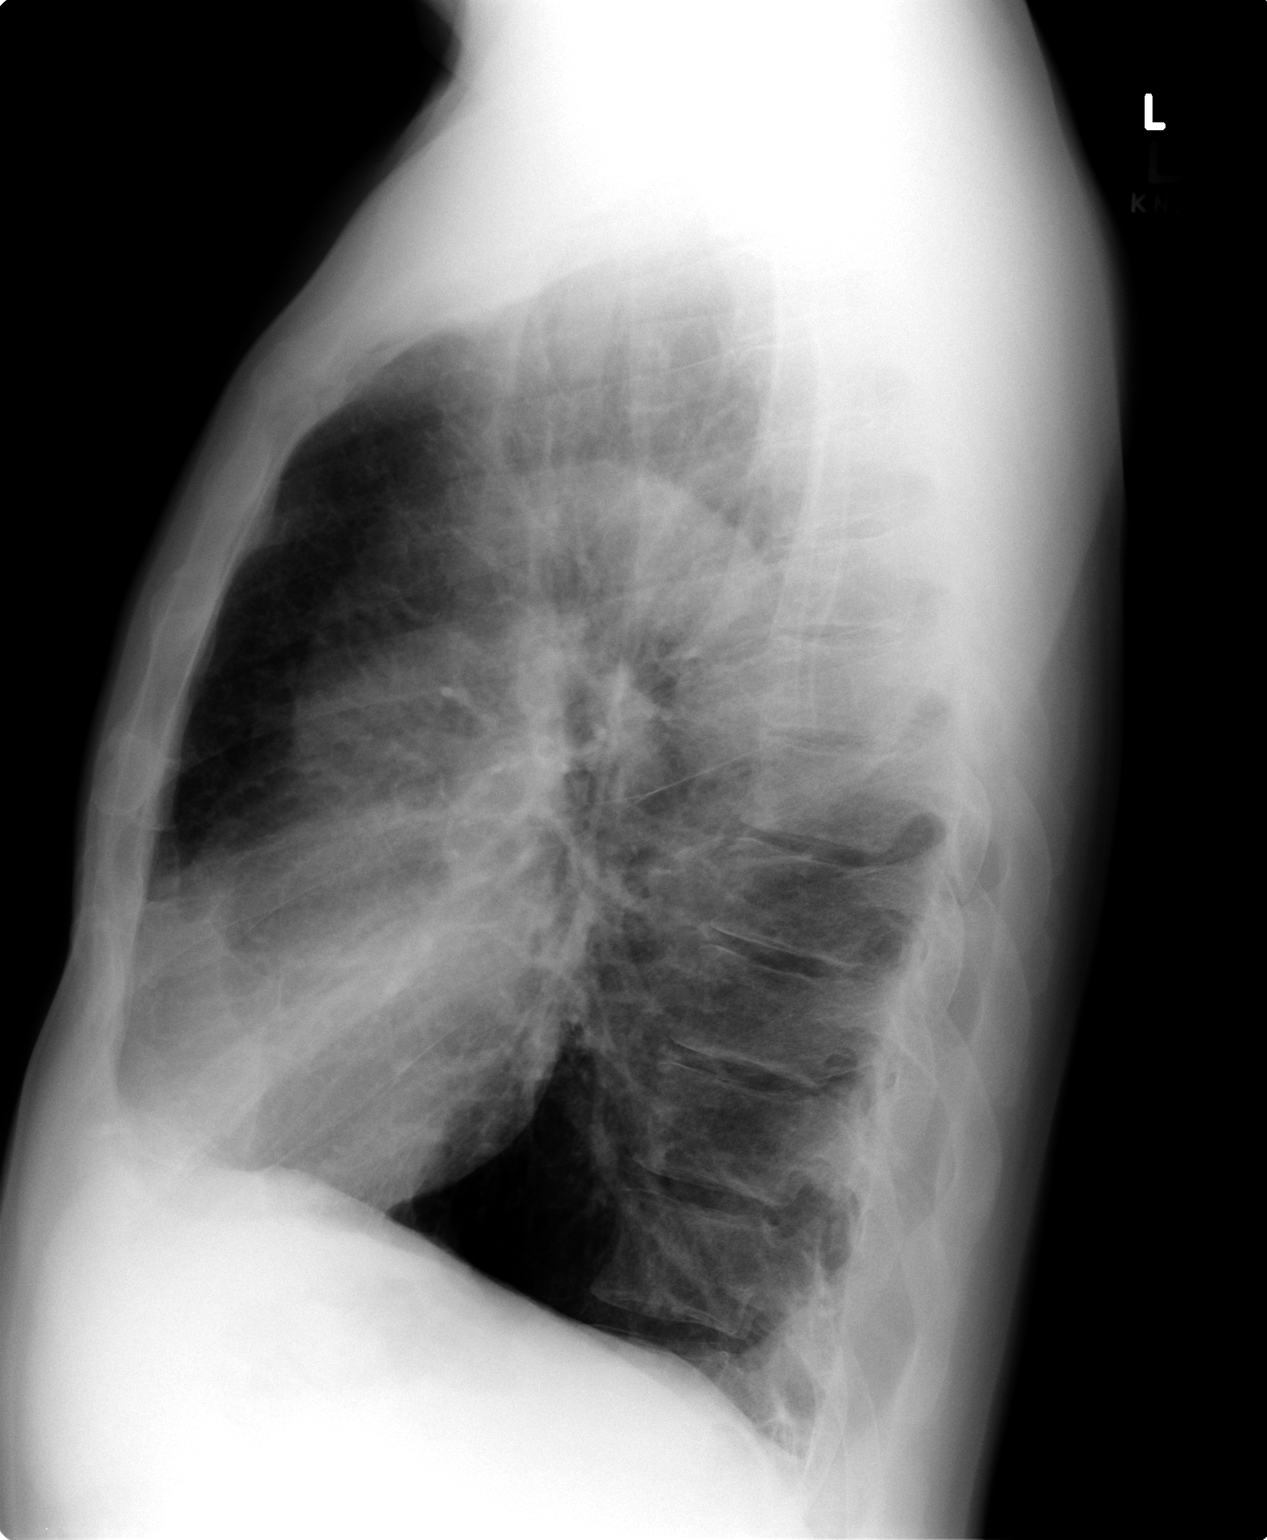

[2 of 2 positions shown; findings below may reference images not displayed]

FINDINGS: Mediastinum and hilar structures are normal. Lungs are clear of
acute infiltrates. Changes consistent with pleural parenchymal
scarring, interstitial fibrosis, COPD. No pleural effusion or
pneumothorax. No acute osseous abnormality.
IMPRESSION: Findings consistent with severe COPD, interstitial fibrosis, and
pleural parenchymal scarring. No change from prior exam .

## 2014-01-07 MED ORDER — TIOTROPIUM BROMIDE MONOHYDRATE 18 MCG IN CAPS: ORAL_CAPSULE | RESPIRATORY_TRACT | Status: DC

## 2014-01-07 MED ORDER — BUDESONIDE-FORMOTEROL FUMARATE 160-4.5 MCG/ACT IN AERO: INHALATION_SPRAY | RESPIRATORY_TRACT | Status: DC

## 2014-01-07 NOTE — Patient Instructions (Signed)
Stop advair and The key is to stop smoking completely before smoking completely stops you!   Plan A =  Automatic =  symbicort 160 Take 2 puffs first thing in am and then another 2 puffs about 12 hours later.                                       spiriva in am only   Plan B = Backup- Proventil use it only if you can't your breath - ok to use up to every 4 hours if needed  Plan C = Nebulizer up to every 4 hours if needed only if you try plan B first and it doesn't work  Please remember to go to the  x-ray department downstairs for your tests - we will call you with the results when they are available.     Please schedule a follow up office visit in 4 weeks, sooner if needed with pfts

## 2014-01-07 NOTE — Progress Notes (Signed)
   Subjective:    Patient ID: Tony Sullivan, male    DOB: [birth year]  MRN: [id number]  HPI  70 yobm active smoker with breathing difficulties since 2004 dx as copd on advair 250 /50 but getting worse since 2014 and placed on 02 for the first time in March 2015 and referred 01/07/2014 to pulmonary clinic by Dr Kennon Holter  01/07/2014 1st [facility] office visit/ Tony Sullivan  Chief Complaint  Patient presents with  . Advice Only    Referred for COPD, SOB.  Pt c/o SOB, low 02, prod cough with white mucous.  Pt forgot med list, unsure of what all he is taking  still can do a [employer] but uses [employer] parking, assoc with congested coughing esp in am worse in winter.  No obvious other patterns in day to day or daytime variabilty or assoc   cp or chest tightness, subjective wheeze overt sinus or hb symptoms. No unusual exp hx or h/o childhood pna/ asthma or knowledge of premature birth.  Sleeping ok without nocturnal  or early am exacerbation  of respiratory  c/o's or need for noct saba. Also denies any obvious fluctuation of symptoms with weather or environmental changes or other aggravating or alleviating factors except as outlined above   Current Medications, Allergies, Complete Past Medical History, Past Surgical History, Family History, and Social History were reviewed in [employer] record.            Review of Systems  Constitutional: Negative for fever and unexpected weight change.  HENT: Positive for congestion, sinus pressure and sneezing. Negative for dental problem, ear pain, nosebleeds, postnasal drip, rhinorrhea, sore throat and trouble swallowing.   Eyes: Negative for redness and itching.  Respiratory: Positive for cough and shortness of breath. Negative for chest tightness and wheezing.   Cardiovascular: Negative for palpitations and leg swelling.  Gastrointestinal: Negative for nausea and vomiting.  Genitourinary: Negative for dysuria.  Musculoskeletal:  Negative for joint swelling.  Skin: Negative for rash.  Neurological: Negative for headaches.  Hematological: Does not bruise/bleed easily.  Psychiatric/Behavioral: Negative for dysphoric mood. The patient is not nervous/anxious.        Objective:   Physical Exam  Wt Readings from Last 3 Encounters:  01/07/14 173 lb (78.472 kg)   amb hoarse bm nad on 02     HEENT mild turbinate edema.  Oropharynx no thrush or excess pnd or cobblestoning.  No JVD or cervical adenopathy. Mild accessory muscle hypertrophy. Trachea midline, nl thryroid. Chest was hyperinflated by percussion with diminished breath sounds and moderate increased exp time without wheeze. Hoover sign positive at mid inspiration. Regular rate and rhythm without murmur gallop or rub or increase P2 or edema.  Abd: no hsm, nl excursion. Ext warm without cyanosis- mod bilateral clubbing     CXR  01/07/2014 :  Findings consistent with severe COPD, interstitial fibrosis, and pleural parenchymal scarring. No change from prior exam       Assessment & Plan:

## 2014-01-07 NOTE — Telephone Encounter (Signed)
Message copied by Len Blalock on Fri Jan 07, 2014  3:02 PM ------      Message from: Christinia Gully B      Created: Fri Jan 07, 2014  1:26 PM       Call pt:  Reviewed cxr and no acute change so no change in recommendations made at ov ------

## 2014-01-07 NOTE — Telephone Encounter (Signed)
Pt aware of results and recs.  Nothing further needed. 

## 2014-01-08 DIAGNOSIS — F17200 Nicotine dependence, unspecified, uncomplicated: Secondary | ICD-10-CM | POA: Insufficient documentation

## 2014-01-08 DIAGNOSIS — J449 Chronic obstructive pulmonary disease, unspecified: Secondary | ICD-10-CM | POA: Insufficient documentation

## 2014-01-08 DIAGNOSIS — J9611 Chronic respiratory failure with hypoxia: Secondary | ICD-10-CM | POA: Insufficient documentation

## 2014-01-08 DIAGNOSIS — Z720 Tobacco use: Secondary | ICD-10-CM | POA: Insufficient documentation

## 2014-01-08 DIAGNOSIS — R05 Cough: Secondary | ICD-10-CM | POA: Insufficient documentation

## 2014-01-08 DIAGNOSIS — R059 Cough, unspecified: Secondary | ICD-10-CM | POA: Insufficient documentation

## 2014-01-08 NOTE — Assessment & Plan Note (Signed)

## 2014-01-08 NOTE — Assessment & Plan Note (Signed)
Likely related to copd but may also have element of Pulmonary fibrosis ? All smoking related or primary ILD, f/u pft's planned

## 2014-01-08 NOTE — Assessment & Plan Note (Signed)
DDX of  difficult airways managment all start with A and  include Adherence, Ace Inhibitors, Acid Reflux, Active Sinus Disease, Alpha 1 Antitripsin deficiency, Anxiety masquerading as Airways dz,  ABPA,  allergy(esp in young), Aspiration (esp in elderly), Adverse effects of DPI,  Active smokers, plus two Bs  = Bronchiectasis and Beta blocker use..and one C= CHF  Adherence is always the initial "prime suspect" and is a multilayered concern that requires a "trust but verify" approach in every patient - starting with knowing how to use medications, especially inhalers, correctly, keeping up with refills and understanding the fundamental difference between maintenance and prns vs those medications only taken for a very short course and then stopped and not refilled.  - The proper method of use, as well as anticipated side effects, of a metered-dose inhaler are discussed and demonstrated to the patient. Improved effectiveness after extensive coaching during this visit to a level of approximately  75% so try symbicort / spiriva   ? Adverse effects of dpi, esp  advair > try on hfa symbicort  Active smoker > discussed separately

## 2014-01-08 NOTE — Assessment & Plan Note (Signed)
cb vs effects of advair > rec  try off cigarettes and advair

## 2014-01-10 ENCOUNTER — Emergency Department (HOSPITAL_COMMUNITY): Payer: Medicaid Other

## 2014-01-10 ENCOUNTER — Observation Stay (HOSPITAL_COMMUNITY)
Admission: EM | Admit: 2014-01-10 | Discharge: 2014-01-11 | Disposition: A | Discharge status: Home / Self Care | Payer: Medicaid Other | Attending: Internal Medicine | Admitting: Internal Medicine

## 2014-01-10 ENCOUNTER — Encounter (HOSPITAL_COMMUNITY): Payer: Self-pay | Admitting: Radiology

## 2014-01-10 DIAGNOSIS — R0989 Other specified symptoms and signs involving the circulatory and respiratory systems: Secondary | ICD-10-CM

## 2014-01-10 DIAGNOSIS — S29011A Strain of muscle and tendon of front wall of thorax, initial encounter: Secondary | ICD-10-CM | POA: Diagnosis present

## 2014-01-10 DIAGNOSIS — R0902 Hypoxemia: Secondary | ICD-10-CM

## 2014-01-10 DIAGNOSIS — J449 Chronic obstructive pulmonary disease, unspecified: Secondary | ICD-10-CM | POA: Diagnosis present

## 2014-01-10 DIAGNOSIS — R0600 Dyspnea, unspecified: Secondary | ICD-10-CM

## 2014-01-10 DIAGNOSIS — J441 Chronic obstructive pulmonary disease with (acute) exacerbation: Principal | ICD-10-CM | POA: Insufficient documentation

## 2014-01-10 DIAGNOSIS — R1013 Epigastric pain: Secondary | ICD-10-CM | POA: Insufficient documentation

## 2014-01-10 DIAGNOSIS — F1010 Alcohol abuse, uncomplicated: Secondary | ICD-10-CM | POA: Insufficient documentation

## 2014-01-10 DIAGNOSIS — Z9981 Dependence on supplemental oxygen: Secondary | ICD-10-CM | POA: Insufficient documentation

## 2014-01-10 DIAGNOSIS — M25519 Pain in unspecified shoulder: Secondary | ICD-10-CM | POA: Insufficient documentation

## 2014-01-10 DIAGNOSIS — X58XXXA Exposure to other specified factors, initial encounter: Secondary | ICD-10-CM | POA: Insufficient documentation

## 2014-01-10 DIAGNOSIS — IMO0002 Reserved for concepts with insufficient information to code with codable children: Secondary | ICD-10-CM | POA: Insufficient documentation

## 2014-01-10 DIAGNOSIS — F39 Unspecified mood [affective] disorder: Secondary | ICD-10-CM | POA: Insufficient documentation

## 2014-01-10 DIAGNOSIS — R0609 Other forms of dyspnea: Secondary | ICD-10-CM

## 2014-01-10 DIAGNOSIS — I10 Essential (primary) hypertension: Secondary | ICD-10-CM | POA: Insufficient documentation

## 2014-01-10 DIAGNOSIS — Z720 Tobacco use: Secondary | ICD-10-CM | POA: Diagnosis present

## 2014-01-10 DIAGNOSIS — R0789 Other chest pain: Secondary | ICD-10-CM

## 2014-01-10 DIAGNOSIS — F17200 Nicotine dependence, unspecified, uncomplicated: Secondary | ICD-10-CM | POA: Insufficient documentation

## 2014-01-10 HISTORY — DX: Chronic obstructive pulmonary disease, unspecified: J44.9

## 2014-01-10 LAB — COMPREHENSIVE METABOLIC PANEL
ALT: 16 U/L (ref 0–53)
AST: 24 U/L (ref 0–37)
Albumin: 3.7 g/dL (ref 3.5–5.2)
Alkaline Phosphatase: 78 U/L (ref 39–117)
BUN: 14 mg/dL (ref 6–23)
CO2: 23 mEq/L (ref 19–32)
Calcium: 8.9 mg/dL (ref 8.4–10.5)
Chloride: 102 mEq/L (ref 96–112)
Creatinine, Ser: 1.04 mg/dL (ref 0.50–1.35)
GFR calc Af Amer: >90 mL/min (ref >90)
GFR calc non Af Amer: 82 mL/min — ABNORMAL LOW (ref >90)
Glucose, Bld: 98 mg/dL (ref 70–99)
Potassium: 3.6 mEq/L — ABNORMAL LOW (ref 3.7–5.3)
Sodium: 140 mEq/L (ref 137–147)
Total Bilirubin: 0.5 mg/dL (ref 0.3–1.2)
Total Protein: 7.2 g/dL (ref 6.0–8.3)

## 2014-01-10 LAB — CBC WITH DIFFERENTIAL/PLATELET
Basophils Absolute: 0 10*3/uL (ref 0.0–0.1)
Basophils Relative: 0 % (ref 0–1)
Eosinophils Absolute: 0.2 10*3/uL (ref 0.0–0.7)
Eosinophils Relative: 1 % (ref 0–5)
HCT: 48.5 % (ref 39.0–52.0)
Hemoglobin: 17.3 g/dL — ABNORMAL HIGH (ref 13.0–17.0)
Lymphocytes Relative: 14 % (ref 12–46)
Lymphs Abs: 2.9 10*3/uL (ref 0.7–4.0)
MCH: 33.5 pg (ref 26.0–34.0)
MCHC: 35.7 g/dL (ref 30.0–36.0)
MCV: 94 fL (ref 78.0–100.0)
Monocytes Absolute: 0.9 10*3/uL (ref 0.1–1.0)
Monocytes Relative: 5 % (ref 3–12)
Neutro Abs: 16.1 10*3/uL — ABNORMAL HIGH (ref 1.7–7.7)
Neutrophils Relative %: 80 % — ABNORMAL HIGH (ref 43–77)
Platelets: 276 10*3/uL (ref 150–400)
RBC: 5.16 MIL/uL (ref 4.22–5.81)
RDW: 14.8 % (ref 11.5–15.5)
WBC: 20.1 10*3/uL — ABNORMAL HIGH (ref 4.0–10.5)

## 2014-01-10 LAB — TROPONIN I: Troponin I: <0.3 ng/mL (ref <0.30)

## 2014-01-10 LAB — LIPASE, BLOOD: Lipase: 24 U/L (ref 11–59)

## 2014-01-10 LAB — KETONES, QUALITATIVE: Acetone, Bld: NEGATIVE

## 2014-01-10 LAB — PRO B NATRIURETIC PEPTIDE: Pro B Natriuretic peptide (BNP): 21.5 pg/mL (ref 0–125)

## 2014-01-10 LAB — LACTIC ACID, PLASMA: Lactic Acid, Venous: 3.2 mmol/L — ABNORMAL HIGH (ref 0.5–2.2)

## 2014-01-10 IMAGING — CT CT ANGIO CHEST
2 of 9 series · 19 of 46 positions shown · IV contrast (APPLIED)
Comparison: Chest CT [DATE].

CLINICAL DATA: Shortness of breath. Cramp like pain and left
shoulder.

EXAM:
CT ANGIOGRAPHY CHEST WITH CONTRAST
TECHNIQUE: Multidetector CT imaging of the chest was performed using the
standard protocol during bolus administration of intravenous
contrast. Multiplanar CT image reconstructions and MIPs were
obtained to evaluate the vascular anatomy.
CONTRAST:  100mL OMNIPAQUE IOHEXOL 350 MG/ML SOLN

[Series 5: thins · axial · 0.77mm/px · z∈[-116,+162]mm · 16 of 314 slices shown]
[im 18/314  lung]
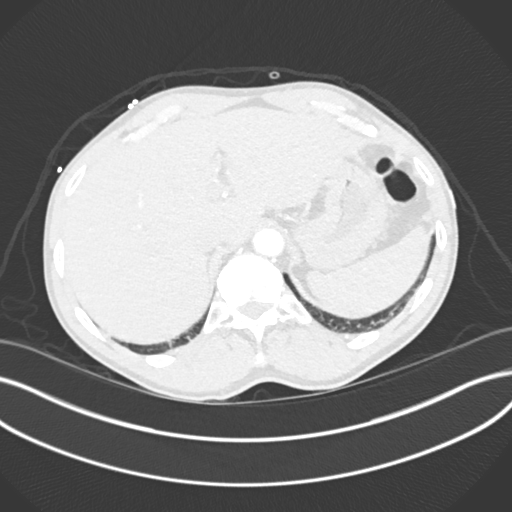
[im 35/314  soft-tissue]
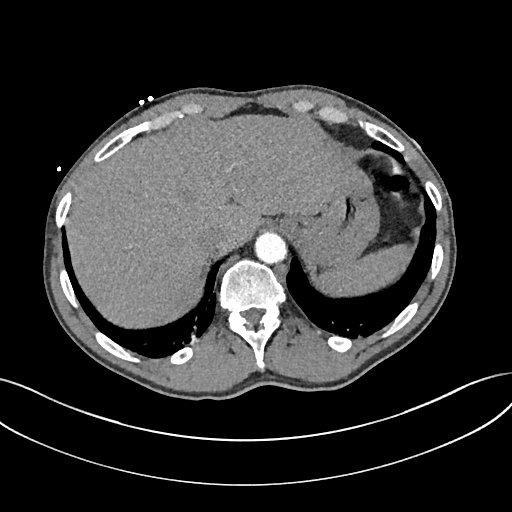
[im 53/314  lung]
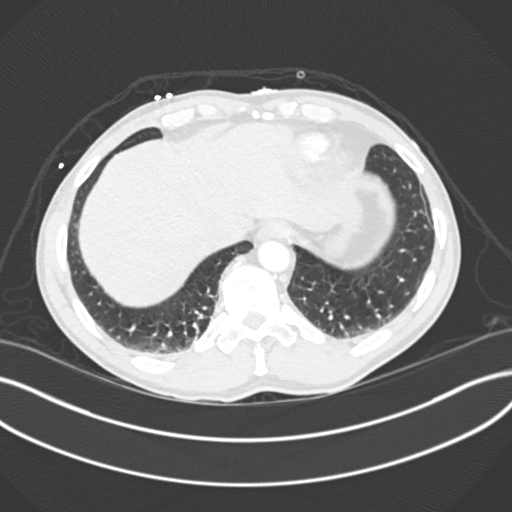
[im 70/314  soft-tissue]
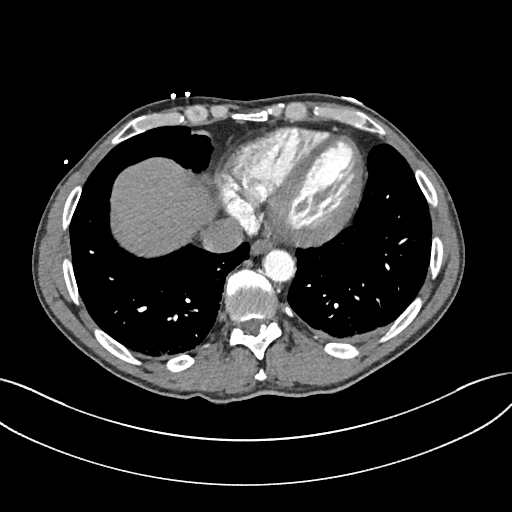
[im 87/314  lung]
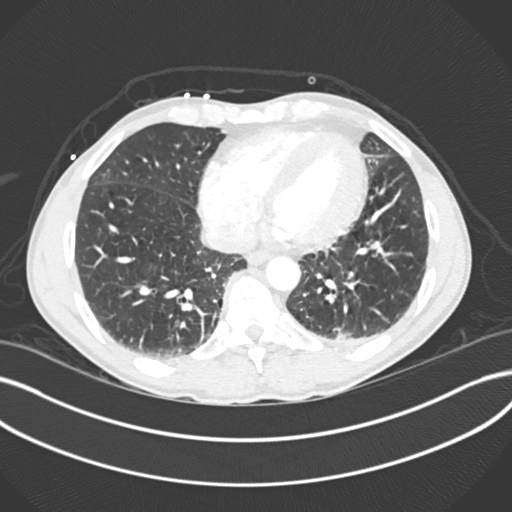
[im 105/314  soft-tissue]
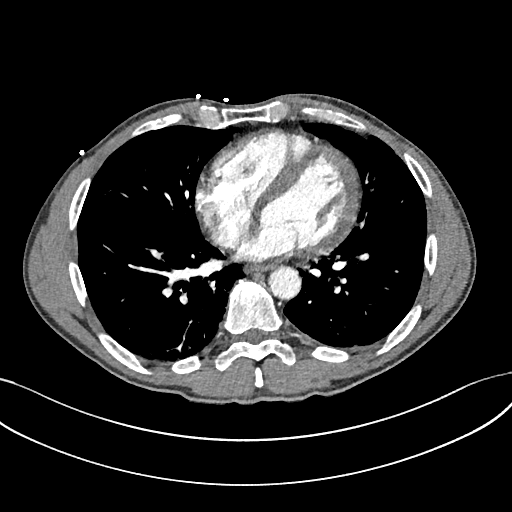
[im 122/314  lung]
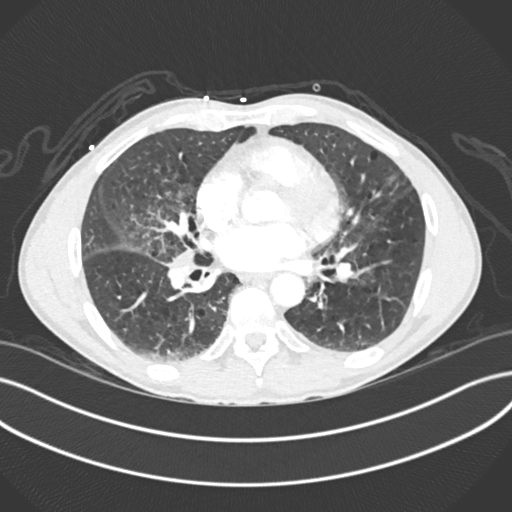
[im 140/314  soft-tissue]
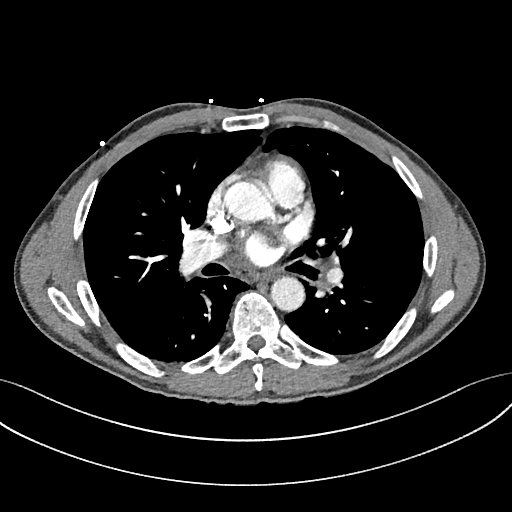
[im 174/314  lung]
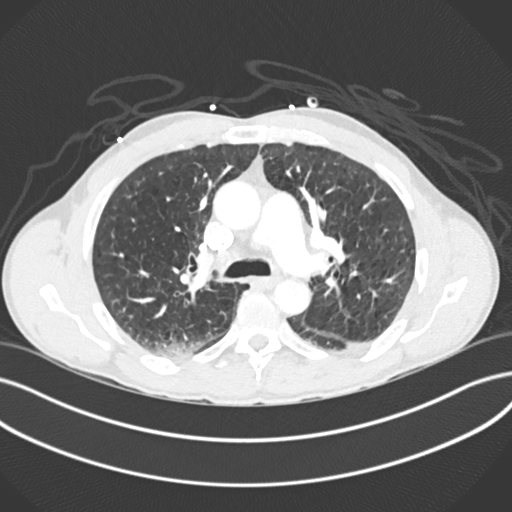
[im 192/314  soft-tissue]
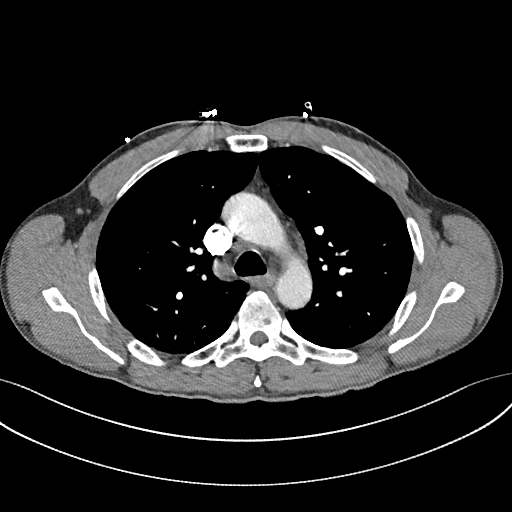
[im 209/314  lung]
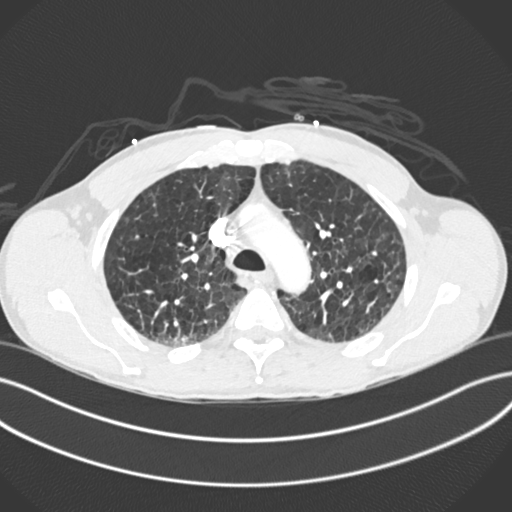
[im 227/314  soft-tissue]
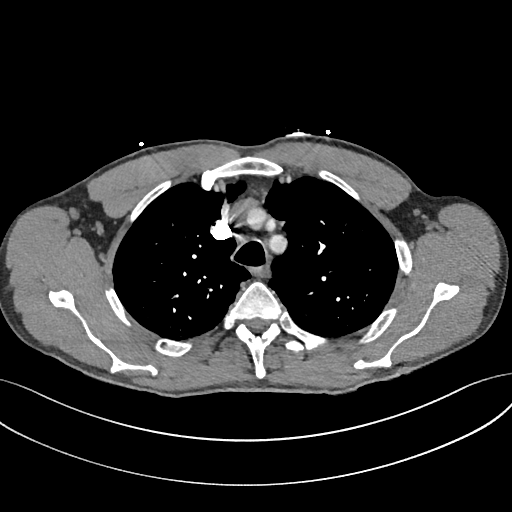
[im 244/314  lung]
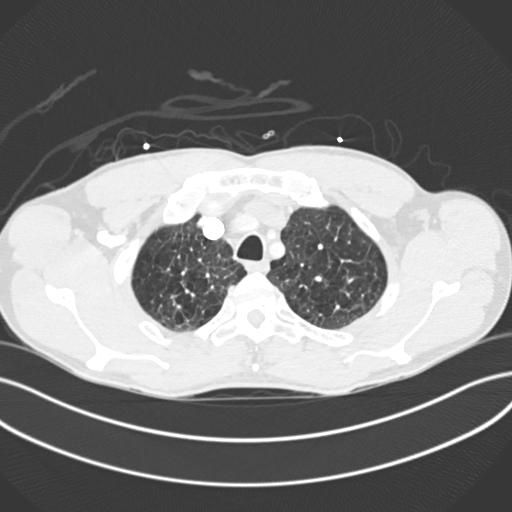
[im 261/314  soft-tissue]
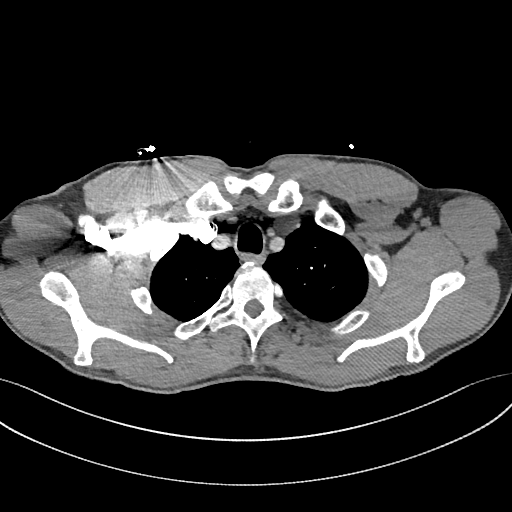
[im 279/314  lung]
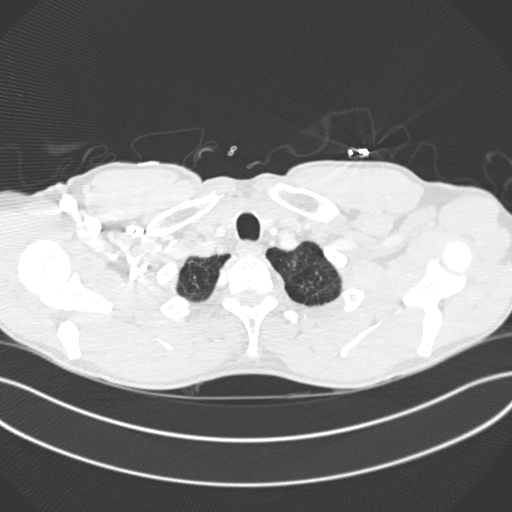
[im 296/314  soft-tissue]
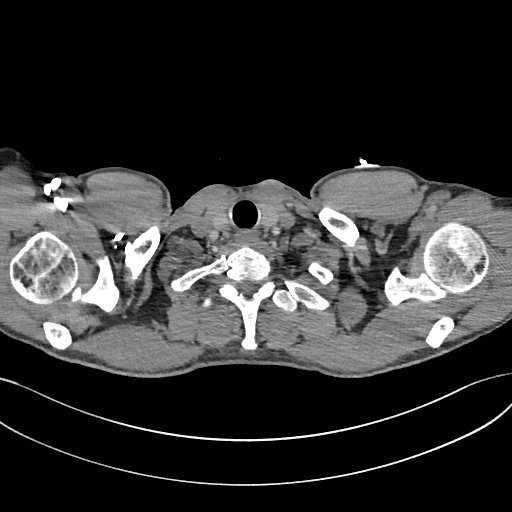

[Series 7: coronal mpr · coronal · 0.74mm/px · 3 of 143 slices shown]
[im 36/143  soft-tissue]
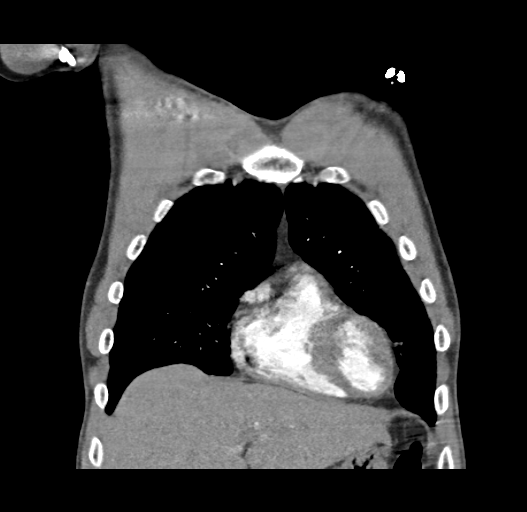
[im 72/143  soft-tissue]
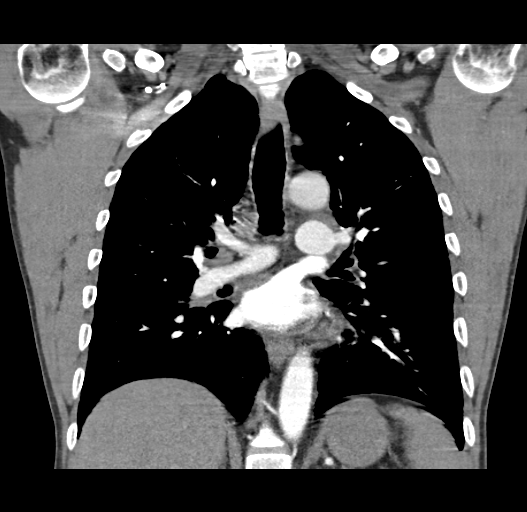
[im 107/143  soft-tissue]
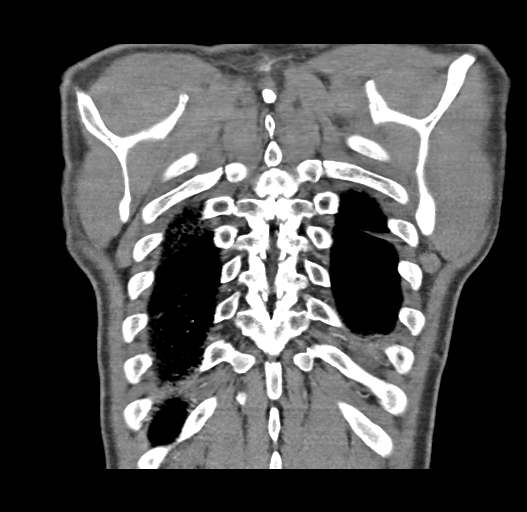

[19 of 46 positions shown; findings below may reference images not displayed]

FINDINGS: Mediastinum: No filling defects within the pulmonary arterial tree
to suggest underlying pulmonary embolism. Heart size is normal.
There is no significant pericardial fluid, thickening or pericardial
calcification. Multiple prominent borderline enlarged mediastinal
and hilar lymph nodes, similar to the prior study. There is a
minimally enlarged right hilar lymph node measuring 11 mm in short
axis, which is nonspecific and unchanged compared to the prior
examination. Esophagus is unremarkable in appearance.

Lungs/Pleura: There is a background of moderate centrilobular and
mild paraseptal emphysema. Mild diffuse bronchial wall thickening.
No acute consolidative airspace disease. No pleural effusions. No
definite suspicious appearing pulmonary nodules or masses are
identified.

Upper Abdomen: Unremarkable.

Musculoskeletal: There are no aggressive appearing lytic or blastic
lesions noted in the visualized portions of the skeleton.

Review of the MIP images confirms the above findings.
IMPRESSION: 1. No evidence of pulmonary embolism.
2. No acute findings in the thorax to account for the patient's
symptoms.
3. Moderate centrilobular and mild paraseptal emphysema.
4. Borderline enlarged mediastinal and bilateral hilar lymph nodes,
with mild enlargement of a right hilar lymph node. These findings
are unchanged compared to the prior study, and presumably benign.

## 2014-01-10 IMAGING — CR DG CHEST 2V
2 series · 2 of 2 positions shown · non-contrast
Comparison: PA and lateral chest [DATE].

CLINICAL DATA: Shortness of breath.

EXAM:
CHEST  2 VIEW

[w chest pa]
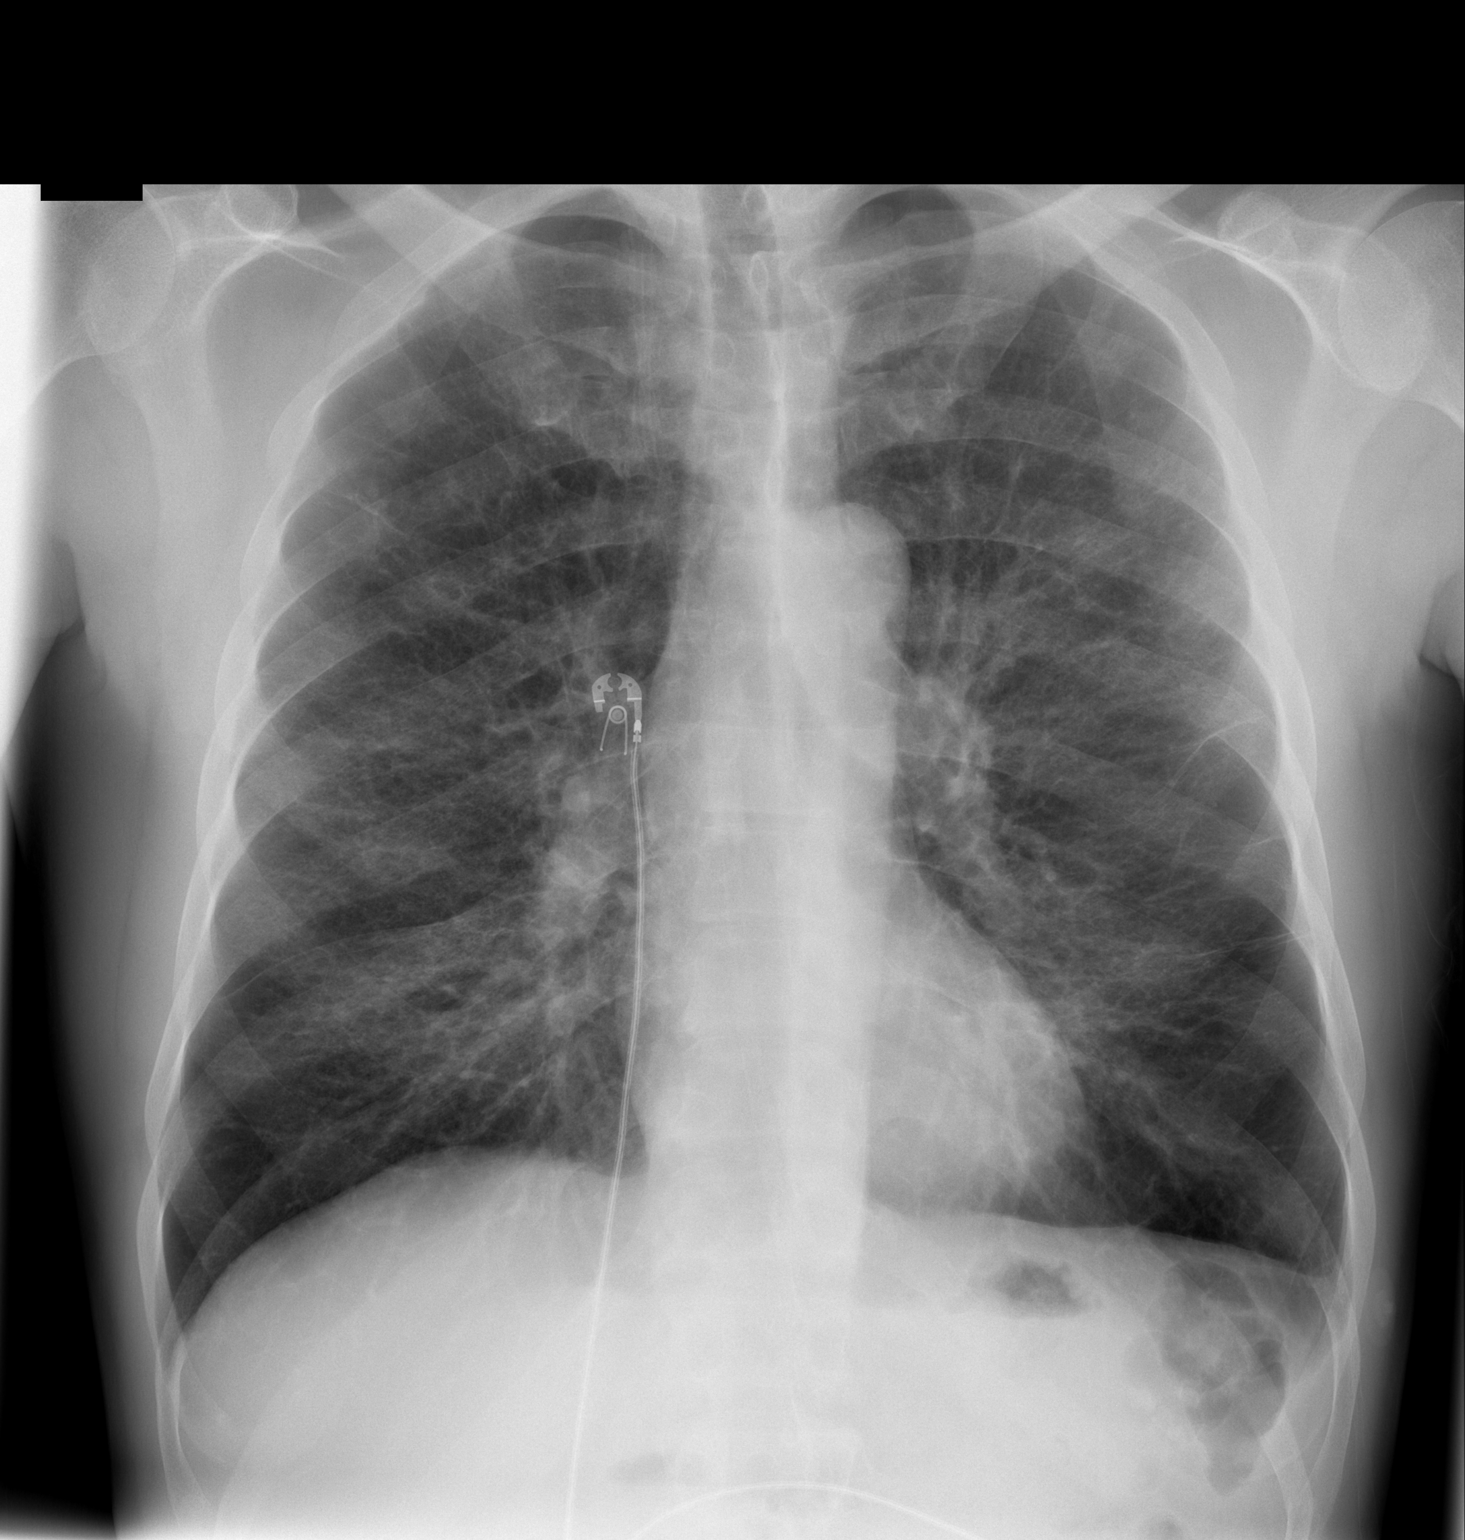

[w chest lat]
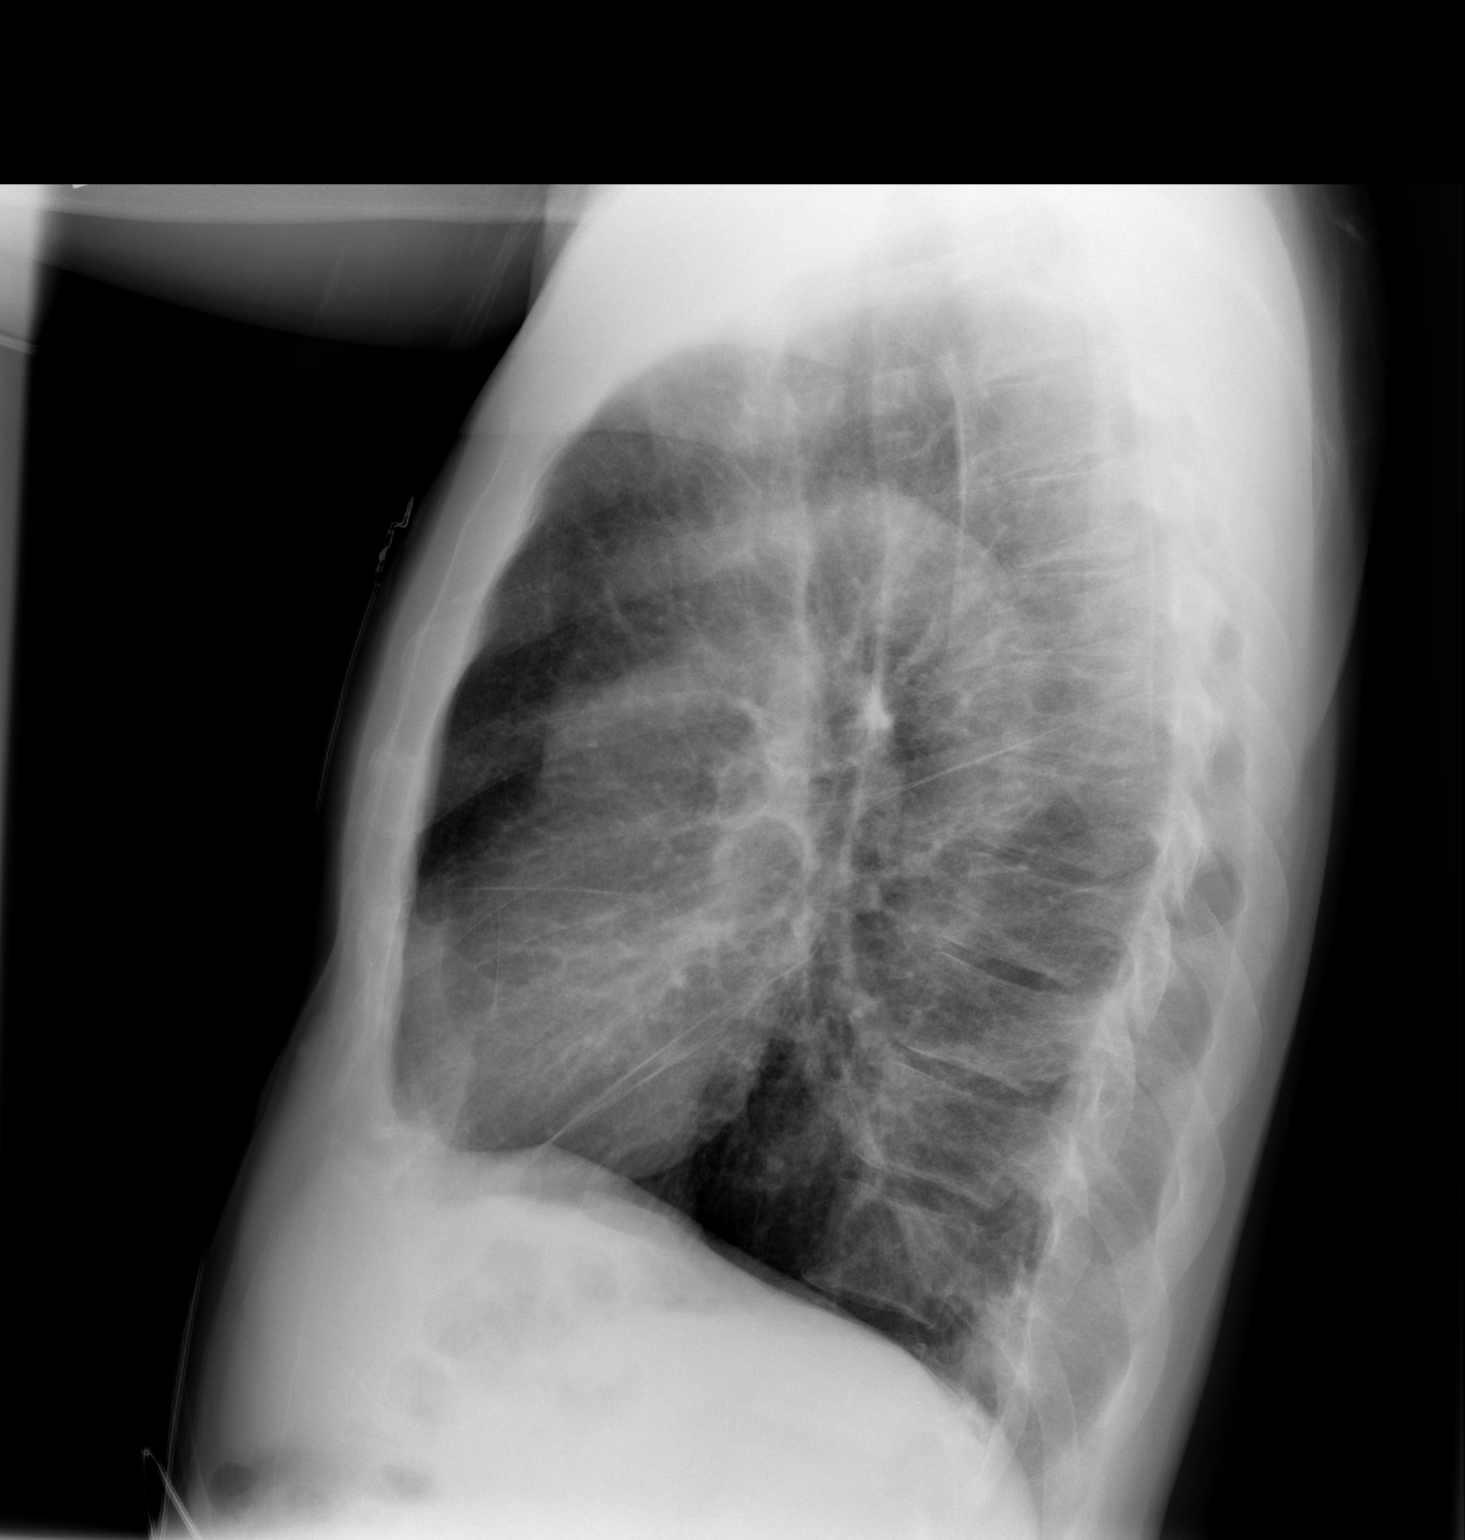

[2 of 2 positions shown; findings below may reference images not displayed]

FINDINGS: The lungs are emphysematous. There is some scar in the right upper
lobe. No consolidative process, pneumothorax or effusion is
identified. Heart size is normal. No focal bony abnormality is seen.
IMPRESSION: Emphysema without acute disease.

## 2014-01-10 MED ORDER — NICOTINE 14 MG/24HR TD PT24
14.0000 mg | MEDICATED_PATCH | Freq: Every day | TRANSDERMAL | Status: DC
  Administered 2014-01-10 – 2014-01-11 (×2): 14 mg via TRANSDERMAL
  Filled 2014-01-10 (×2): qty 1

## 2014-01-10 MED ORDER — PANTOPRAZOLE SODIUM 40 MG PO TBEC
40.0000 mg | DELAYED_RELEASE_TABLET | Freq: Every day | ORAL | Status: DC
  Administered 2014-01-10 – 2014-01-11 (×2): 40 mg via ORAL
  Filled 2014-01-10 (×3): qty 1

## 2014-01-10 MED ORDER — ADULT MULTIVITAMIN W/MINERALS CH
1.0000 | ORAL_TABLET | Freq: Every day | ORAL | Status: DC
  Administered 2014-01-10 – 2014-01-11 (×2): 1 via ORAL
  Filled 2014-01-10 (×2): qty 1

## 2014-01-10 MED ORDER — THIAMINE HCL 100 MG/ML IJ SOLN
100.0000 mg | Freq: Every day | INTRAMUSCULAR | Status: DC
  Filled 2014-01-10 (×2): qty 1

## 2014-01-10 MED ORDER — VITAMIN B-1 100 MG PO TABS
100.0000 mg | ORAL_TABLET | Freq: Every day | ORAL | Status: DC
  Administered 2014-01-10 – 2014-01-11 (×2): 100 mg via ORAL
  Filled 2014-01-10 (×2): qty 1

## 2014-01-10 MED ORDER — ACETAMINOPHEN 325 MG PO TABS: 650.0000 mg | ORAL_TABLET | Freq: Four times a day (QID) | ORAL | Status: DC | PRN

## 2014-01-10 MED ORDER — PREDNISONE 20 MG PO TABS
40.0000 mg | ORAL_TABLET | Freq: Every day | ORAL | Status: DC
  Administered 2014-01-10 – 2014-01-11 (×2): 40 mg via ORAL
  Filled 2014-01-10 (×3): qty 2

## 2014-01-10 MED ORDER — FOLIC ACID 1 MG PO TABS
1.0000 mg | ORAL_TABLET | Freq: Every day | ORAL | Status: DC
  Administered 2014-01-10 – 2014-01-11 (×2): 1 mg via ORAL
  Filled 2014-01-10 (×2): qty 1

## 2014-01-10 MED ORDER — LORAZEPAM 1 MG PO TABS: 1.0000 mg | ORAL_TABLET | Freq: Four times a day (QID) | ORAL | Status: DC | PRN

## 2014-01-10 MED ORDER — MIRTAZAPINE 15 MG PO TABS
15.0000 mg | ORAL_TABLET | Freq: Every day | ORAL | Status: DC
  Administered 2014-01-10: 15 mg via ORAL
  Filled 2014-01-10 (×3): qty 1

## 2014-01-10 MED ORDER — LORAZEPAM 2 MG/ML IJ SOLN: 1.0000 mg | Freq: Four times a day (QID) | INTRAMUSCULAR | Status: DC | PRN

## 2014-01-10 MED ORDER — ENOXAPARIN SODIUM 40 MG/0.4ML ~~LOC~~ SOLN
40.0000 mg | SUBCUTANEOUS | Status: DC
  Filled 2014-01-10 (×2): qty 0.4

## 2014-01-10 MED ORDER — ACETAMINOPHEN 650 MG RE SUPP: 650.0000 mg | Freq: Four times a day (QID) | RECTAL | Status: DC | PRN

## 2014-01-10 MED ORDER — IPRATROPIUM-ALBUTEROL 0.5-2.5 (3) MG/3ML IN SOLN: 3.0000 mL | RESPIRATORY_TRACT | Status: DC | PRN

## 2014-01-10 MED ORDER — TRAMADOL HCL 50 MG PO TABS: 50.0000 mg | ORAL_TABLET | Freq: Four times a day (QID) | ORAL | Status: DC | PRN

## 2014-01-10 MED ORDER — BUDESONIDE-FORMOTEROL FUMARATE 160-4.5 MCG/ACT IN AERO
2.0000 | INHALATION_SPRAY | Freq: Two times a day (BID) | RESPIRATORY_TRACT | Status: DC
  Administered 2014-01-10 – 2014-01-11 (×2): 2 via RESPIRATORY_TRACT
  Filled 2014-01-10: qty 6

## 2014-01-10 MED ORDER — IOHEXOL 350 MG/ML SOLN
100.0000 mL | Freq: Once | INTRAVENOUS | Status: AC | PRN
  Administered 2014-01-10: 100 mL via INTRAVENOUS

## 2014-01-10 MED ORDER — LORATADINE 10 MG PO TABS
10.0000 mg | ORAL_TABLET | Freq: Every day | ORAL | Status: DC
  Administered 2014-01-10 – 2014-01-11 (×2): 10 mg via ORAL
  Filled 2014-01-10 (×3): qty 1

## 2014-01-10 MED ORDER — METHYLPREDNISOLONE SODIUM SUCC 125 MG IJ SOLR
125.0000 mg | Freq: Once | INTRAMUSCULAR | Status: AC
  Administered 2014-01-10: 125 mg via INTRAVENOUS
  Filled 2014-01-10: qty 2

## 2014-01-10 MED ORDER — ALBUTEROL SULFATE (2.5 MG/3ML) 0.083% IN NEBU
2.5000 mg | INHALATION_SOLUTION | RESPIRATORY_TRACT | Status: DC | PRN
Start: 2014-01-10 — End: 2014-01-10

## 2014-01-10 MED ORDER — ALBUTEROL (5 MG/ML) CONTINUOUS INHALATION SOLN
10.0000 mg/h | INHALATION_SOLUTION | Freq: Once | RESPIRATORY_TRACT | Status: AC
  Administered 2014-01-10: 10 mg/h via RESPIRATORY_TRACT
  Filled 2014-01-10: qty 20

## 2014-01-10 NOTE — ED Notes (Signed)
Patient arrives from home with complaint of shortness of breath starting after sneezing. Patient describes a cramp in left shoulder directly above axilla directly after sneeze. Describes shortness of breath that has persisted and gradually worsening over a period of a couple hours. Used inhaler, started using oxygen, and administered breathing treatment to self with little relief. Normally does not use oxygen unless exerting self.

## 2014-01-10 NOTE — ED Provider Notes (Signed)
Pt had been seen by Dr Lita Mains and FP called to admit, as edp had felt pcp was [facility].  Pt states pcp is '[facility]', so unassigned on call paged.  Discussed w [facility] resident on call - will admit.     Mirna Mires, MD 01/10/14 [phone]

## 2014-01-10 NOTE — ED Notes (Signed)
Pt returned from radiology.

## 2014-01-10 NOTE — ED Notes (Signed)
Per Dr. Ashok Cordia, wrong service was consulted on pt, pt needs to stay in ED until resident from correct service comes to see the pt.

## 2014-01-10 NOTE — ED Notes (Addendum)
Internal medicine resident at bedside. sts he is finished and will be putting orders in shortly. Pt is able to go upstairs now.

## 2014-01-10 NOTE — Progress Notes (Signed)
0738: report received from ED RN for admission to [phone]

## 2014-01-10 NOTE — H&P (Signed)
Date: 01/10/2014               Patient Name:  Tony Sullivan MRN: [id number]  DOB: 31-Jan-1961 Age / Sex: 53 y.o., male   PCP: Ellison Carwin, MD         Medical Service: Internal Medicine Teaching Service         Attending Physician: Dr. Madilyn Fireman, MD    First Contact: Dr. Stann Mainland Pager: [phone]  Second Contact: Dr. Algis Liming Pager: [phone]       After Hours (After 5p/  First Contact Pager: [phone]  weekends / holidays): Second Contact Pager: [phone]   Chief Complaint: shortness of breath  History of Present Illness: The patient is a 53 yo man, history of COPD on home O2 2L [place], HTN, tobacco abuse, presenting with shortness of breath.  One day prior to admission, the patient notes an episode of coughing, followed by acute left shoulder pain and shortness of breath, feeling as if his lungs were "tight".  He used his home oxygen and nebulizer treatment, with relief of symptoms.  However, symptoms recurred later that night, and again on the morning of admission, prompting the patient to seek medical care.  The patient notes no increase in cough from baseline, but mildly increased production of clear/yellow sputum, for which he has been using mucinex.  No orthopnea, PND, or LE edema.  He notes associated chills, but no rhinorrhea, sore throat, nausea, or vomiting.  He does note an episode of epigastric abdominal pain 1 week ago, which has improved but not yet resolved.  In the ED, the patient was given solu-medrol 125 mg, and albuterol, with significant improvement in symptoms.  Meds: Prescriptions prior to admission  Medication Sig Dispense Refill  . albuterol (PROVENTIL) (2.5 MG/3ML) 0.083% nebulizer solution Take 2.5 mg by nebulization every 6 (six) hours as needed for wheezing or shortness of breath.      . AMLODIPINE BESYLATE PO Take 1 tablet by mouth daily.      . budesonide-formoterol (SYMBICORT) 160-4.5 MCG/ACT inhaler Inhale 2 puffs into the lungs every 12 (twelve) hours. Take 2  puffs first thing in am and then another 2 puffs about 12 hours later.      . cetirizine (ZYRTEC) 10 MG tablet Take 10 mg by mouth daily.      Marland Kitchen LISINOPRIL PO Take 1 tablet by mouth daily.      Marland Kitchen LOXAPINE SUCCINATE PO Take 1 tablet by mouth daily.      . mirtazapine (REMERON) 15 MG tablet Take 15 mg by mouth at bedtime.      Marland Kitchen RisperiDONE (RISPERDAL PO) Take 1 tablet by mouth daily.      Marland Kitchen tiotropium (SPIRIVA HANDIHALER) 18 MCG inhalation capsule One capsule each am  30 capsule  11     Allergies: Allergies as of 01/10/2014 - Review Complete 01/10/2014  Allergen Reaction Noted  . Penicillins Throat swelling 01/07/2014  . Sulfa antibiotics swelling 01/07/2014   Past Medical History  Diagnosis Date  . High blood pressure   . COPD (chronic obstructive pulmonary disease) 2004    diagnosed in 2004, no PFT's to date.  Started on home O2 12/2013, after found to be desatting at PCP's office, and referred to pulmonology.  . Seasonal allergies    Past Surgical History  Procedure Laterality Date  . Appendectomy  1980  . Hip surgery Right   . Shoulder arthroscopy Right    Family History  Problem Relation Age of Onset  .  Emphysema Mother   . Emphysema Father   . Allergies Mother     "everyone in family"  . Allergies Father   . Allergies Son   . Asthma Mother     "everyone in family"  . Heart disease Mother   . Heart disease Maternal Aunt   . Clotting disorder Mother   . Cancer Mother    History   Social History  . Marital Status: Single    Spouse Name: N/A    Number of Children: N/A  . Years of Education: N/A   Occupational History  . Not on file.   Social History Main Topics  . Smoking status: Current Every Day Smoker -- 0.25 packs/day for 36 years    Types: Cigarettes  . Smokeless tobacco: Never Used     Comment: Was up to 2 ppd  . Alcohol Use: 7 - 10.5 oz/week    14-21 drink(s) per week     Comment: 2-3 beers/day, with occasional "binges".  No history of withdrawal   . Drug Use: Not on file  . Sexual Activity: Not on file   Other Topics Concern  . Not on file   Social History Narrative  . No narrative on file    Review of Systems: General: no fevers, changes in weight, changes in appetite Skin: no rash HEENT: no blurry vision, hearing changes, sore throat Pulm: see HPI CV: no chest pain, palpitations Abd: +abdominal pain, no nausea/vomiting, diarrhea/constipation GU: no dysuria, hematuria, polyuria Ext: +left shoulder pain since coughing Neuro: no weakness, numbness, or tingling   Physical Exam: Blood pressure 114/71, pulse 85, temperature 98.4 F (36.9 C), temperature source Oral, resp. rate 19, SpO2 94.00%. General: alert, cooperative, appears quite comfortable HEENT: pupils equal round and reactive to light, vision grossly intact, oropharynx clear and non-erythematous  Neck: supple Lungs: normal work of respiration, minimal wheeze heard bilaterally Heart: regular rate and rhythm, no murmurs, gallops, or rubs Chest: Left pectoral muscle tenderness to palpation Abdomen: mild-moderate epigastric ttp, no distension, guarding, or rebound tenderness.  Normoactive bowel sounds Extremities: no cyanosis, clubbing, or edema Neurologic: alert & oriented X3, cranial nerves II-XII intact, strength grossly intact, sensation intact to light touch  Lab results: Basic Metabolic Panel:  Recent Labs  01/10/14 0320  NA 140  K 3.6*  CL 102  CO2 23  GLUCOSE 98  BUN 14  CREATININE 1.04  CALCIUM 8.9   Liver Function Tests:  Recent Labs  01/10/14 0320  AST 24  ALT 16  ALKPHOS 78  BILITOT 0.5  PROT 7.2  ALBUMIN 3.7   CBC:  Recent Labs  01/10/14 0320  WBC 20.1*  NEUTROABS 16.1*  HGB 17.3*  HCT 48.5  MCV 94.0  PLT 276   Cardiac Enzymes:  Recent Labs  01/10/14 0320  TROPONINI <0.30   BNP:  Recent Labs  01/10/14 0320  PROBNP 21.5    Imaging results:  Dg Chest 2 View  01/10/2014   CLINICAL DATA:  Shortness of  breath.  EXAM: CHEST  2 VIEW  COMPARISON:  PA and lateral chest 01/07/2014.  FINDINGS: The lungs are emphysematous. There is some scar in the right upper lobe. No consolidative process, pneumothorax or effusion is identified. Heart size is normal. No focal bony abnormality is seen.  IMPRESSION: Emphysema without acute disease.   Electronically Signed   By: Inge Rise M.D.   On: 01/10/2014 03:31   Ct Angio Chest Pe W/cm &/or Wo Cm  01/10/2014   CLINICAL DATA:  Shortness of breath. Cramp like pain and left shoulder.  EXAM: CT ANGIOGRAPHY CHEST WITH CONTRAST  TECHNIQUE: Multidetector CT imaging of the chest was performed using the standard protocol during bolus administration of intravenous contrast. Multiplanar CT image reconstructions and MIPs were obtained to evaluate the vascular anatomy.  CONTRAST:  140mL OMNIPAQUE IOHEXOL 350 MG/ML SOLN  COMPARISON:  Chest CT 04/12/2011.  FINDINGS: Mediastinum: No filling defects within the pulmonary arterial tree to suggest underlying pulmonary embolism. Heart size is normal. There is no significant pericardial fluid, thickening or pericardial calcification. Multiple prominent borderline enlarged mediastinal and hilar lymph nodes, similar to the prior study. There is a minimally enlarged right hilar lymph node measuring 11 mm in short axis, which is nonspecific and unchanged compared to the prior examination. Esophagus is unremarkable in appearance.  Lungs/Pleura: There is a background of moderate centrilobular and mild paraseptal emphysema. Mild diffuse bronchial wall thickening. No acute consolidative airspace disease. No pleural effusions. No definite suspicious appearing pulmonary nodules or masses are identified.  Upper Abdomen: Unremarkable.  Musculoskeletal: There are no aggressive appearing lytic or blastic lesions noted in the visualized portions of the skeleton.  Review of the MIP images confirms the above findings.  IMPRESSION: 1. No evidence of pulmonary  embolism. 2. No acute findings in the thorax to account for the patient's symptoms. 3. Moderate centrilobular and mild paraseptal emphysema. 4. Borderline enlarged mediastinal and bilateral hilar lymph nodes, with mild enlargement of a right hilar lymph node. These findings are unchanged compared to the prior study, and presumably benign.   Electronically Signed   By: Vinnie Langton M.D.   On: 01/10/2014 07:31    Other results: EKG: Sinus tachycardia, significant motion artifact, J-point elevation in V3-V5, no acute ST abnormalities  Assessment & Plan by Problem:  # Acute COPD exacerbation - the patient presents with increased dyspnea and sputum production, consistent with a COPD exacerbation.  However, by the time of my examination, after steroids and albuterol, the patient appears very comfortable, with minimal wheeze heard bilaterally.  No evidence for PE (CTA neg), CAP (CT neg), or CHF (no edema/orthopnea, pro-BNP low) as cause of the patient's symptoms. -continue steroids, with prednisone 40 mg daily starting tomorrow -duonebs prn -I will not start antibiotics at this time, as COPD exacerbation appears mild and already significantly improved -O2 prn (on home O2 2L, but patient uses prn)  # Hypertension - history of HTN.  Patient currently normotensive.  On home Amlodipine, lisinopril. -hold home amlodipine, lisinopril for now.  Can re-add as indicated  # Alcohol abuse - the patient drinks 2-3 drinks/day.  No reported history of withdrawal -CIWA  # Epigastric pain - mild epigastric pain noted on exam, which the patient states has been present for 1 week, and improving.  Differential includes gastritis (reports daily meloxicam usage, alcohol) vs acute pancreatitis (alcohol use, but no n/v) vs GERD. -check lipase -start protonix  # Left pectoral muscle strain - the patient notes acute left pectoral pain after coughing, with point ttp on exam, likely representing an acute muscle strain.   No evidence for PE (CTA neg) vs ACS (atypical pain, trop neg 12 hours after event). -tramadol for pain  # ?Mood disorder - the patient was previously treated at [facility] for a psychiatric condition, diagnosis unknown.  The patient now takes Loxapine and Risperidone (dosages unknown) 2-3 times per week "as needed".  This is currently managed by the patient's PCP. -hold antipsychotics for now, given unknown dosing, and patient's inconsistent  usage at home -pt to follow-up with PCP in the outpatient setting  # Prophy - lovenox  Dispo: Disposition is deferred at this time, awaiting improvement of current medical problems. Anticipated discharge in approximately 1-2 day(s).   The patient does have a current PCP Ellison Carwin, MD) and does not need an [facility] hospital follow-up appointment after discharge.  Signed: Hester Mates, MD 01/10/2014, 8:14 AM

## 2014-01-10 NOTE — ED Notes (Signed)
Patient completed continuous nebulizer treatment. Allowed patient to remain on RA to assess oxygenation. O2 saturations noted to be in 86-91%. 2L via [place] re-applied. MD [facility] aware.

## 2014-01-10 NOTE — ED Provider Notes (Signed)
CSN: [id number]     Arrival date & time 01/10/14  0229 History   First MD Initiated Contact with Patient 01/10/14 0249     Chief Complaint  Patient presents with  . Shortness of Breath  . Chills     (Consider location/radiation/quality/duration/timing/severity/associated sxs/prior Treatment) HPI Patient has a history of emphysema and occasionally wears oxygen at night. He states that this evening he sneezed and began to have chills. He then had gradual worsening shortness of breath with tightness in his chest especially on the left. He used his nebulized treatment at home with some relief. He denies any lower extremity swelling or pain. He has had ongoing cough which is chronic for him. His productive of white sputum. He denies any subjective fevers at home. No recent extended travel or surgeries. Past Medical History  Diagnosis Date  . High blood pressure   . Emphysema of lung 2003  . Seasonal allergies   . Sleep apnea    Past Surgical History  Procedure Laterality Date  . Appendectomy  1980  . Hip surgery Right   . Shoulder arthroscopy Right    Family History  Problem Relation Age of Onset  . Emphysema Mother   . Emphysema Father   . Allergies Mother     "everyone in family"  . Allergies Father   . Allergies Son   . Asthma Mother     "everyone in family"  . Heart disease Mother   . Heart disease Maternal Aunt   . Clotting disorder Mother   . Cancer Mother    History  Substance Use Topics  . Smoking status: Current Every Day Smoker -- 0.25 packs/day for 36 years    Types: Cigarettes  . Smokeless tobacco: Never Used     Comment: Was up to 2 ppd  . Alcohol Use: No    Review of Systems  Constitutional: Positive for chills. Negative for fever.  HENT: Negative for congestion.   Respiratory: Positive for cough, chest tightness and shortness of breath. Negative for wheezing.   Cardiovascular: Negative for chest pain, palpitations and leg swelling.  Gastrointestinal:  Negative for nausea, vomiting, abdominal pain and diarrhea.  Musculoskeletal: Negative for back pain, myalgias, neck pain and neck stiffness.  Skin: Negative for rash and wound.  Neurological: Negative for dizziness, weakness, light-headedness, numbness and headaches.  All other systems reviewed and are negative.      Allergies  Penicillins and Sulfa antibiotics  Home Medications   Current Outpatient Rx  Name  Route  Sig  Dispense  Refill  . budesonide-formoterol (SYMBICORT) 160-4.5 MCG/ACT inhaler      Take 2 puffs first thing in am and then another 2 puffs about 12 hours later.   1 Inhaler   11   . mirtazapine (REMERON) 15 MG tablet   Oral   Take 15 mg by mouth at bedtime.         Marland Kitchen tiotropium (SPIRIVA HANDIHALER) 18 MCG inhalation capsule      One capsule each am   30 capsule   11    BP 132/84  Pulse 109  Temp(Src) 98.4 F (36.9 C) (Oral)  Resp 16  SpO2 98% Physical Exam  Nursing note and vitals reviewed. Constitutional: He is oriented to person, place, and time. He appears well-developed and well-nourished. No distress.  HENT:  Head: Normocephalic and atraumatic.  Mouth/Throat: Oropharynx is clear and moist. No oropharyngeal exudate.  Eyes: EOM are normal. Pupils are equal, round, and reactive to light.  Neck: Normal range of motion. Neck supple.  Cardiovascular: Normal rate and regular rhythm.   Pulmonary/Chest: Breath sounds normal. No respiratory distress. He has no wheezes. He has no rales. He exhibits tenderness (chest tenderness is completely reproduced with palpation over the lateral pectoralis muscle on the left. He has no crepitance or deformity. There is no evidence of trauma.).  Increased respiratory effort and mild tachypnea. Diminished breath sounds throughout. No definite wheezing.  Abdominal: Soft. Bowel sounds are normal. He exhibits no distension and no mass. There is no tenderness. There is no rebound and no guarding.  Musculoskeletal:  Normal range of motion. He exhibits no edema and no tenderness.  No calf tenderness or swelling.  Neurological: He is alert and oriented to person, place, and time.  Moves all of his extremities without any deficit. Sensation is grossly intact.  Skin: Skin is warm and dry. No rash noted. No erythema.  Psychiatric: He has a normal mood and affect. His behavior is normal.    ED Course  Procedures (including critical care time) Labs Review Labs Reviewed  CBC WITH DIFFERENTIAL  COMPREHENSIVE METABOLIC PANEL  PRO B NATRIURETIC PEPTIDE  TROPONIN I   Imaging Review No results found.   EKG Interpretation   Date/Time:  Monday January 10 2014 02:37:51 EDT Ventricular Rate:  112 PR Interval:  144 QRS Duration: 80 QT Interval:  344 QTC Calculation: 469 R Axis:   102 Text Interpretation:  Sinus tachycardia Biatrial enlargement Rightward  axis Pulmonary disease pattern Septal infarct , age undetermined Abnormal  ECG Confirmed by Lita Mains  MD, Ahlana Slaydon ([id number]) on 01/10/2014 5:00:53 AM      MDM   Final diagnoses:  None    Patient dropped saturations to 84% when taken off oxygen. He continues to clinic complaining of left lateral chest pain. This is reproduced with palpation. He also has epigastric tenderness. Will have a low suspicion for PE but the patient is tachycardic and hypoxic with no definite cause. We will get CT angio chest and likely will need to be admitted.  Discussed with family medicine service. Will see in emergency department and admit  Julianne Rice, MD 01/11/14 [phone]

## 2014-01-10 NOTE — Progress Notes (Signed)
Tony Sullivan [id number] Code Status: FULL Admission Data: 01/10/2014 8:28 AM Attending Provider:  Ellwood Dense  [id number], Janalee Dane, MD Consults/ Treatment Team: Internal Medicine Teaching Service   Tony Sullivan is a 53 y.o. male patient admitted from ED awake, alert - oriented  X 3 - no acute distress noted.  VSS - Blood pressure 126/81, pulse 84, temperature 98 F (36.7 C), temperature source Oral, resp. rate 20, height 5\' 11"  (1.803 m), weight 76.25 kg (168 lb 1.6 oz), SpO2 91.00%.  no c/o shortness of breath, no c/o chest pain.  O2:   2L/min Nasal [place]  IV Fluids:  IV in place, occlusive dsg intact without redness, IV cath forearm right, condition patent and no redness none.  Allergies:   Allergies  Allergen Reactions  . Penicillins     Throat swelling  . Sulfa Antibiotics     swelling     Past Medical History  Diagnosis Date  . High blood pressure   . COPD (chronic obstructive pulmonary disease) 2004    diagnosed in 2004, no PFT's to date.  Started on home O2 12/2013, after found to be desatting at PCP's office, and referred to pulmonology.  . Seasonal allergies    Medications Prior to Admission  Medication Sig Dispense Refill  . albuterol (PROVENTIL) (2.5 MG/3ML) 0.083% nebulizer solution Take 2.5 mg by nebulization every 6 (six) hours as needed for wheezing or shortness of breath.      . AMLODIPINE BESYLATE PO Take 1 tablet by mouth daily.      . budesonide-formoterol (SYMBICORT) 160-4.5 MCG/ACT inhaler Inhale 2 puffs into the lungs every 12 (twelve) hours. Take 2 puffs first thing in am and then another 2 puffs about 12 hours later.      . cetirizine (ZYRTEC) 10 MG tablet Take 10 mg by mouth daily.      Marland Kitchen LISINOPRIL PO Take 1 tablet by mouth daily.      Marland Kitchen LOXAPINE SUCCINATE PO Take 1 tablet by mouth daily.      . mirtazapine (REMERON) 15 MG tablet Take 15 mg by mouth at bedtime.      Marland Kitchen RisperiDONE (RISPERDAL PO) Take 1 tablet by mouth daily.      Marland Kitchen tiotropium  (SPIRIVA HANDIHALER) 18 MCG inhalation capsule One capsule each am  30 capsule  11   History:  obtained from the patient. Tobacco/alcohol: Smoked 2 packs per day for 36 years 3 beers per day(s)  Orientation to room, and floor completed with information packet given to patient/family.  Patient declined safety video at this time.  Admission INP armband ID verified with patient/family, and in place.   SR up x 2, fall assessment complete, with patient and family able to verbalize understanding of risk associated with falls, and verbalized understanding to call nsg before up out of bed.  Call light within reach, patient able to voice, and demonstrate understanding.  Skin, clean-dry- intact without evidence of bruising, or skin tears.   No evidence of skin break down noted on exam.    Will cont to eval and treat per MD orders.  Delman Cheadle, RN 01/10/2014 8:28 AM

## 2014-01-11 DIAGNOSIS — S29011A Strain of muscle and tendon of front wall of thorax, initial encounter: Secondary | ICD-10-CM | POA: Diagnosis present

## 2014-01-11 LAB — LACTIC ACID, PLASMA: Lactic Acid, Venous: 1.8 mmol/L (ref 0.5–2.2)

## 2014-01-11 MED ORDER — PREDNISONE 10 MG PO TABS: ORAL_TABLET | ORAL | Status: DC

## 2014-01-11 MED ORDER — PREDNISONE 10 MG PO TABS: 40.0000 mg | ORAL_TABLET | Freq: Every day | ORAL | Status: DC

## 2014-01-11 NOTE — Progress Notes (Signed)
Subjective: Mr. Hounshell is doing quite well this morning.  States that he is breathing well, minimal coughing with mild sputum production (patient notes he also has more phlegm after not smoking for a day or two).  Eating well, slept well, has been walking in halls without O2.  Patient asking to go home.   Objective: Vital signs in last 24 hours: Filed Vitals:   01/10/14 1233 01/10/14 2159 01/10/14 2247 01/11/14 0522  BP: 133/77 152/88  153/97  Pulse: 82 87  94  Temp: 97.5 F (36.4 C) 98 F (36.7 C)  97.5 F (36.4 C)  TempSrc: Oral Oral  Oral  Resp: 20 20  20   Height:      Weight:      SpO2: 94% 91% 92% 90%   Weight change:   Intake/Output Summary (Last 24 hours) at 01/11/14 0720 Last data filed at 01/10/14 1400  Gross per 24 hour  Intake    444 ml  Output      0 ml  Net    444 ml   PEX General: alert, cooperative, and in no apparent distress HEENT: NCAT, vision grossly intact, oropharynx clear and non-erythematous  Neck: supple, no lymphadenopathy Lungs: clear to ascultation bilaterally, normal work of respiration, no wheezes, rales, ronchi Heart: regular rate and rhythm, no murmurs, gallops, or rubs Abdomen: soft, non-tender, non-distended, normal bowel sounds Extremities: 2+ DP/PT pulses bilaterally, no cyanosis, clubbing, or edema Neurologic: alert & oriented X3, cranial nerves II-XII intact, strength grossly intact, sensation intact to light touch  Lab Results: Basic Metabolic Panel:  Recent Labs Lab 01/10/14 0320  NA 140  K 3.6*  CL 102  CO2 23  GLUCOSE 98  BUN 14  CREATININE 1.04  CALCIUM 8.9   Liver Function Tests:  Recent Labs Lab 01/10/14 0320  AST 24  ALT 16  ALKPHOS 78  BILITOT 0.5  PROT 7.2  ALBUMIN 3.7    Recent Labs Lab 01/10/14 0941  LIPASE 24   CBC:  Recent Labs Lab 01/10/14 0320  WBC 20.1*  NEUTROABS 16.1*  HGB 17.3*  HCT 48.5  MCV 94.0  PLT 276   Cardiac Enzymes:  Recent Labs Lab 01/10/14 0320  TROPONINI  <0.30   BNP:  Recent Labs Lab 01/10/14 0320  PROBNP 21.5    Studies/Results: Dg Chest 2 View  01/10/2014   CLINICAL DATA:  Shortness of breath.  EXAM: CHEST  2 VIEW  COMPARISON:  PA and lateral chest 01/07/2014.  FINDINGS: The lungs are emphysematous. There is some scar in the right upper lobe. No consolidative process, pneumothorax or effusion is identified. Heart size is normal. No focal bony abnormality is seen.  IMPRESSION: Emphysema without acute disease.   Electronically Signed   By: Inge Rise M.D.   On: 01/10/2014 03:31   Ct Angio Chest Pe W/cm &/or Wo Cm  01/10/2014   CLINICAL DATA:  Shortness of breath. Cramp like pain and left shoulder.  EXAM: CT ANGIOGRAPHY CHEST WITH CONTRAST  TECHNIQUE: Multidetector CT imaging of the chest was performed using the standard protocol during bolus administration of intravenous contrast. Multiplanar CT image reconstructions and MIPs were obtained to evaluate the vascular anatomy.  CONTRAST:  142mL OMNIPAQUE IOHEXOL 350 MG/ML SOLN  COMPARISON:  Chest CT 04/12/2011.  FINDINGS: Mediastinum: No filling defects within the pulmonary arterial tree to suggest underlying pulmonary embolism. Heart size is normal. There is no significant pericardial fluid, thickening or pericardial calcification. Multiple prominent borderline enlarged mediastinal and hilar lymph nodes,  similar to the prior study. There is a minimally enlarged right hilar lymph node measuring 11 mm in short axis, which is nonspecific and unchanged compared to the prior examination. Esophagus is unremarkable in appearance.  Lungs/Pleura: There is a background of moderate centrilobular and mild paraseptal emphysema. Mild diffuse bronchial wall thickening. No acute consolidative airspace disease. No pleural effusions. No definite suspicious appearing pulmonary nodules or masses are identified.  Upper Abdomen: Unremarkable.  Musculoskeletal: There are no aggressive appearing lytic or blastic lesions  noted in the visualized portions of the skeleton.  Review of the MIP images confirms the above findings.  IMPRESSION: 1. No evidence of pulmonary embolism. 2. No acute findings in the thorax to account for the patient's symptoms. 3. Moderate centrilobular and mild paraseptal emphysema. 4. Borderline enlarged mediastinal and bilateral hilar lymph nodes, with mild enlargement of a right hilar lymph node. These findings are unchanged compared to the prior study, and presumably benign.   Electronically Signed   By: Vinnie Langton M.D.   On: 01/10/2014 07:31   Medications: I have reviewed the patient's current medications. Scheduled Meds: . budesonide-formoterol  2 puff Inhalation Q12H  . enoxaparin (LOVENOX) injection  40 mg Subcutaneous Q24H  . folic acid  1 mg Oral Daily  . loratadine  10 mg Oral Daily  . mirtazapine  15 mg Oral QHS  . multivitamin with minerals  1 tablet Oral Daily  . nicotine  14 mg Transdermal Daily  . pantoprazole  40 mg Oral Daily  . predniSONE  40 mg Oral Q breakfast  . thiamine  100 mg Oral Daily   Or  . thiamine  100 mg Intravenous Daily     PRN Meds:.acetaminophen, acetaminophen, ipratropium-albuterol, LORazepam, LORazepam, traMADol Assessment/Plan: # Acute COPD exacerbation, improved- Patient presented with increased dyspnea and sputum production, consistent with COPD exacerbation.  No evidence for PE (CTA neg), CAP (CT neg), or CHF (no edema/orthopnea, pBNP low) as cause of the patient's symptoms.  Patient states his dyspnea has resolved including with ambulation (without O2).  No wheezing on exam this morning.  Patient has not requested any duonebs.  -prednisone 40 mg today, taper at discharge  -continue O2 prn (on home O2 2L, but patient uses prn)  -follow-up with Dr. [name] and PFTs scheduled on 4/20  # Hypertension- history of HTN, mildy hypertensive to 643P systolic now. On amlodipine, lisinopril at home.  -restart home amlodipine, lisinopril at discharge  #  Alcohol abuse- Patient drinks 2-3 drinks/day. No reported history of withdrawal. -CIWA protocol, patient has not required any lorazepam  # Epigastric pain, resolved- mild epigastric pain noted on admission exam, which patient stated had been present for 1 week and was improving; likely due to gastritis (daily meloxicam and EtOH use).  Lipase within normal limits.  Patient eating well, no nausea/vomiting.  -continue PPI at discharge  # Left pectoral muscle strain, improved- Patient notes acute left pectoral pain after coughing episode, with point tenderness on exam, likely representing an acute muscle strain.  No evidence for PE (CTA neg) vs ACS (atypical pain, trop neg 12 hours after event).  -tramadol for pain though patient has not requested any   # ?Mood disorder- Patient previously treated at [facility] for a psychiatric condition, diagnosis unknown.  He now takes Loxapine and Risperidone 2-3 times per week "as needed," managed by patient's PCP Dr. Ouida Sills.  -holding antipsychotics while inpatient given prn usage at home -PCP follow-up next week   Dispo:  Anticipated discharge today.  The patient does have a current PCP Ellison Carwin, MD) and does need an [facility] hospital follow-up appointment after discharge.   .Services Needed at time of discharge: Y = Yes, Blank = No PT:   OT:   RN:   Equipment:   Other:     LOS: 1 day   Ivin Poot, MD 01/11/2014, 7:20 AM

## 2014-01-11 NOTE — Discharge Instructions (Signed)
Please pick up steroid prescription at your pharmacy today.  You should take 3 tabs tomorrow 3/18 with breakfast, 2 tabs on 3/19, 1 tab on 3/20 then stop.  You should restart your blood pressure medicines and follow-up with your primary doctor next week.   Also don't forget your appointment with Dr. [name] on 4/20 at 11am.  Chronic Obstructive Pulmonary Disease Chronic obstructive pulmonary disease (COPD) is a common lung problem. In COPD, the flow of air from the lungs is limited. The way your lungs work will probably never return to normal, but there are things you can do to improve you lungs and make yourself feel better. HOME CARE  Take all medicines as told by your doctor.  Only take over-the-counter or prescription medicines as told by your doctor.  Avoid medicines or cough syrups that dry up your airway (such as antihistamines) and do not allow you to get rid of thick spit. You do not need to avoid them if told differently by your doctor.  If you smoke, stop. Smoking makes the problem worse.  Avoid being around things that make your breathing worse (like smoke, chemicals, and fumes).  Use oxygen therapy and therapy to help improve your lungs (pulmonary rehabilitation) if told by your doctor. If you need home oxygen therapy, ask your doctor if you should buy a tool to measure your oxygen level (oximeter).  Avoid people who have a sickness you can catch (contagious).  Avoid going outside when it is very hot, cold, or humid.  Eat healthy foods. Eat smaller meals more often. Rest before meals.  Stay active, but remember to also rest.  Make sure to get all the shots (vaccines) your doctor recommends. Ask your doctor if you need a pneumonia shot.  Learn and use tips on how to relax.  Learn and use tips on how to control your breathing as told by your doctor. Try:  Breathing in (inhaling) through your nose for 1 second. Then, pucker your lips and breath out (exhale) through your  lips for 2 seconds.  Putting one hand on your belly (abdomen). Breathe in slowly through your nose for 1 second. Your hand on your belly should move out. Pucker your lips and breathe out slowly through your lips. Your hand on your belly should move in as you breathe out.  Learn and use controlled coughing to clear thick spit from your lungs. 1. Lean your head a little forward. 2. Breathe in deeply. 3. Try to hold your breath for 3 seconds. 4. Keep your mouth slightly open while coughing 2 times. 5. Spit any thick spit out into a tissue. 6. Rest and do the steps again 1 or 2 times as needed. GET HELP IF:  You cough up more thick spit than usual.  There is a change in the color or thickness of the spit.  It is harder to breathe than usual.  Your breathing is faster than usual. GET HELP RIGHT AWAY IF:   You have shortness of breath while resting.  You have shortness of breath that stops you from:  Being able to talk.  Doing normal activities.  You chest hurts for longer than 5 minutes.  Your skin color is more blue than usual.  Your pulse oximeter shows that you have low oxygen for longer than 5 minutes. MAKE SURE YOU:   Understand these instructions.  Will watch your condition.  Will get help right away if you are not doing well or get worse. Document Released:  04/01/2008 Document Revised: 08/04/2013 Document Reviewed: 06/10/2013 [facility] Patient Information 2014 [place].

## 2014-01-11 NOTE — Discharge Summary (Signed)
Name: Tony Sullivan MRN: [id number] DOB: June 20, 1961 53 y.o. PCP: Ellison Carwin, MD  Date of Admission: 01/10/2014  2:36 AM Date of Discharge: 01/11/2014 Attending Physician: Madilyn Fireman, MD  Discharge Diagnosis: Principal Problem:   COPD with acute exacerbation Active Problems:   COPD pfts pending   Smoker   Pectoralis muscle strain  Discharge Medications:   Medication List         albuterol (2.5 MG/3ML) 0.083% nebulizer solution  Commonly known as:  PROVENTIL  Take 2.5 mg by nebulization every 6 (six) hours as needed for wheezing or shortness of breath.     amLODipine 10 MG tablet  Commonly known as:  NORVASC  Take 10 mg by mouth daily.     budesonide-formoterol 160-4.5 MCG/ACT inhaler  Commonly known as:  SYMBICORT  Inhale 2 puffs into the lungs every 12 (twelve) hours. Take 2 puffs first thing in am and then another 2 puffs about 12 hours later.     cetirizine 10 MG tablet  Commonly known as:  ZYRTEC  Take 10 mg by mouth daily.     Fluticasone-Salmeterol 250-50 MCG/DOSE Aepb  Commonly known as:  ADVAIR  Inhale 1 puff into the lungs 2 (two) times daily.     lisinopril 20 MG tablet  Commonly known as:  PRINIVIL,ZESTRIL  Take 20 mg by mouth daily.     loxapine 10 MG capsule  Commonly known as:  LOXITANE  Take 10 mg by mouth daily.     meloxicam 7.5 MG tablet  Commonly known as:  MOBIC  Take 7.5 mg by mouth daily.     mirtazapine 30 MG tablet  Commonly known as:  REMERON  Take 30 mg by mouth at bedtime.     omeprazole 40 MG capsule  Commonly known as:  PRILOSEC  Take 40 mg by mouth daily.     predniSONE 10 MG tablet  Commonly known as:  DELTASONE  Take 3 tabs on 3/18, 2 tabs on 3/19, 1 tab on 3/20 then stop.     risperiDONE 2 MG tablet  Commonly known as:  RISPERDAL  Take 2 mg by mouth at bedtime.     tiotropium 18 MCG inhalation capsule  Commonly known as:  SPIRIVA  Place 18 mcg into inhaler and inhale daily.     traMADol 50 MG tablet    Commonly known as:  ULTRAM  Take 50 mg by mouth every 6 (six) hours as needed for moderate pain.        Disposition and follow-up:   Mr.Anmol A Smoots was discharged from [facility] in Stable condition.  At the hospital follow up visit please address:  1.  Resolution of shortness of breath, cough?  2.  Please follow-up on leucocytosis (baseline?)  3.  Labs / imaging needed at time of follow-up: CBC   4.  Pending labs/ test needing follow-up: none  Follow-up Appointments: Follow-up Information   Follow up with Roselee Culver, MD On 01/19/2014. (12pm)    Specialty:  Family Medicine   Contact information:   [place] [address] [phone]       Follow up with Christinia Gully, MD On 02/14/2014. (11am)    Specialty:  Pulmonary Disease   Contact information:   [address] [address] [phone]       Discharge Instructions:  Future Appointments Provider Department Dept Phone   02/14/2014 11:00 AM [facility] [phone]   02/14/2014  12:00 PM [name], MD [facility] [phone]      Consultations:  none  Procedures Performed:  Dg Chest 2 View  01/10/2014   CLINICAL DATA:  Shortness of breath.  EXAM: CHEST  2 VIEW  COMPARISON:  PA and lateral chest 01/07/2014.  FINDINGS: The lungs are emphysematous. There is some scar in the right upper lobe. No consolidative process, pneumothorax or effusion is identified. Heart size is normal. No focal bony abnormality is seen.  IMPRESSION: Emphysema without acute disease.   Electronically Signed   By: Inge Rise M.D.   On: 01/10/2014 03:31   Dg Chest 2 View  01/07/2014   CLINICAL DATA:  COPD.  EXAM: CHEST  2 VIEW  COMPARISON:  DG CHEST 2V dated 04/12/2011  FINDINGS: Mediastinum and hilar structures are normal. Lungs are clear of acute infiltrates. Changes consistent with pleural parenchymal scarring, interstitial fibrosis, COPD.  No pleural effusion or pneumothorax. No acute osseous abnormality.  IMPRESSION: Findings consistent with severe COPD, interstitial fibrosis, and pleural parenchymal scarring. No change from prior exam .   Electronically Signed   By: [place]   On: 01/07/2014 11:09   Ct Angio Chest Pe W/cm &/or Wo Cm  01/10/2014   CLINICAL DATA:  Shortness of breath. Cramp like pain and left shoulder.  EXAM: CT ANGIOGRAPHY CHEST WITH CONTRAST  TECHNIQUE: Multidetector CT imaging of the chest was performed using the standard protocol during bolus administration of intravenous contrast. Multiplanar CT image reconstructions and MIPs were obtained to evaluate the vascular anatomy.  CONTRAST:  144mL OMNIPAQUE IOHEXOL 350 MG/ML SOLN  COMPARISON:  Chest CT 04/12/2011.  FINDINGS: Mediastinum: No filling defects within the pulmonary arterial tree to suggest underlying pulmonary embolism. Heart size is normal. There is no significant pericardial fluid, thickening or pericardial calcification. Multiple prominent borderline enlarged mediastinal and hilar lymph nodes, similar to the prior study. There is a minimally enlarged right hilar lymph node measuring 11 mm in short axis, which is nonspecific and unchanged compared to the prior examination. Esophagus is unremarkable in appearance.  Lungs/Pleura: There is a background of moderate centrilobular and mild paraseptal emphysema. Mild diffuse bronchial wall thickening. No acute consolidative airspace disease. No pleural effusions. No definite suspicious appearing pulmonary nodules or masses are identified.  Upper Abdomen: Unremarkable.  Musculoskeletal: There are no aggressive appearing lytic or blastic lesions noted in the visualized portions of the skeleton.  Review of the MIP images confirms the above findings.  IMPRESSION: 1. No evidence of pulmonary embolism. 2. No acute findings in the thorax to account for the patient's symptoms. 3. Moderate centrilobular and mild paraseptal  emphysema. 4. Borderline enlarged mediastinal and bilateral hilar lymph nodes, with mild enlargement of a right hilar lymph node. These findings are unchanged compared to the prior study, and presumably benign.   Electronically Signed   By: Vinnie Langton M.D.   On: 01/10/2014 07:31    Admission HPI:  The patient is a 53 yo man, history of COPD on home O2 2L [place], HTN, tobacco abuse, presenting with shortness of breath. One day prior to admission, the patient notes an episode of coughing, followed by acute left shoulder pain and shortness of breath, feeling as if his lungs were "tight". He used his home oxygen and nebulizer treatment, with relief of symptoms. However, symptoms recurred later that night, and again on the morning of admission, prompting the patient to seek medical care. The patient notes no increase in cough from baseline, but mildly  increased production of clear/yellow sputum, for which he has been using mucinex. No orthopnea, PND, or LE edema. He notes associated chills, but no rhinorrhea, sore throat, nausea, or vomiting. He does note an episode of epigastric abdominal pain 1 week ago, which has improved but not yet resolved. In the ED, the patient was given solu-medrol 125 mg, and albuterol, with significant improvement in symptoms.   Hospital Course by problem list: 1. Acute COPD exacerbation, improved- Patient presented with increased dyspnea and sputum production, consistent with COPD exacerbation.  No evidence for PE (CTA neg), CAP (CT neg), or CHF (no edema/orthopnea, pBNP low) as cause of the patient's symptoms.  He did have leucocytosis to 20 on admission likely due to acute stress response vs. COPD exacerbation; however, reviewing past records, patient may have baseline leucocytosis to ~18.  Patient states his dyspnea has resolved including with ambulation (without O2).  No wheezing on exam day of discharge, and patient had not requested any duonebs.  Patient received prednisone 40  mg daily on 3/16-17, provided taper at discharge (antibiotics not warranted).  He will continue to use O2 prn at home.  He has follow-up with PCP next week, Dr. [name] and PFTs on 4/20.   2. Hypertension- history of HTN, mildy hypertensive to 518A systolic on day of discharge. On amlodipine, lisinopril at home which were restarted at discharge.   3. Alcohol abuse- Patient drinks 2-3 drinks/day. No reported history of withdrawal.  He was placed on CIWA protocol while inpatient, patient did not require any lorazepam.  4. Epigastric pain, resolved- Mild epigastric pain noted on admission exam, which patient stated had been present for 1 week and was improving; likely due to gastritis (daily meloxicam and EtOH use).  Lipase within normal limits.  Patient was eating well, no nausea/vomiting.  Continued PPI while inpatient and at discharge.   5. Left pectoral muscle strain, improved- Patient noted acute left pectoral pain after coughing episode, with point tenderness on exam, likely representing an acute muscle strain.  No evidence for PE (CTA neg) vs ACS (atypical pain, trop neg 12 hours after event).  Provided tramadol prn pain though patient did not request any doses.    6. ?Mood disorder- Patient previously treated at [facility] for a psychiatric condition, diagnosis unknown.  He now takes Loxapine and Risperidone 2-3 times per week "as needed," managed by patient's PCP Dr. Ouida Sills.  Held antipsychotics while inpatient given prn usage at home.  PCP follow-up next week.    Discharge Vitals:   BP 153/97  Pulse 94  Temp(Src) 97.5 F (36.4 C) (Oral)  Resp 20  Ht 5\' 11"  (1.803 m)  Wt 168 lb 1.6 oz (76.25 kg)  BMI 23.46 kg/m2  SpO2 90%  Discharge Labs:  Results for orders placed during the hospital encounter of 01/10/14 (from the past 24 hour(s))  LACTIC ACID, PLASMA     Status: None   Collection Time    01/11/14  6:47 AM      Result Value Ref Range   Lactic Acid, Venous 1.8  0.5 - 2.2 mmol/L     Signed: Ivin Poot, MD 01/11/2014, 10:24 AM   Time Spent on Discharge: 35 minutes Services Ordered on Discharge: none Equipment Ordered on Discharge: none

## 2014-01-11 NOTE — H&P (Signed)
INTERNAL MEDICINE TEACHING ATTENDING NOTE  Day 1 of stay  Patient name: Tony Sullivan  MRN: [id number] Date of birth: 04/24/1961   53 y.o. patient with COPD admitted for exacerbation. Treated with IV steroids overnight and switched to PO steroids because he appeared clinically improved.  WHen I saw the patient this morning, he was sitting up in bed, no distress, able to ambulate without any shortness of breath. He had normal vitals, no wheezing on lung exam, cardiac exam within normal limits, no epigastric tenderness when I palpated. There were no signs of any issue acute enough to continue his hospital stay. 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 He denies cough, fever, dysuria, abdominal pain, chest pain, palpitations, nausea, vomiting, headache, dizziness. I think he is stable for discharge and he agrees to follow up with his PCP.  I have seen and evaluated this patient and discussed it with my IM resident team.  Please see the rest of the plan per resident note from today.   [place], [place] 01/11/2014, 11:36 AM.

## 2014-01-11 NOTE — Care Management Note (Signed)
    Page 1 of 1   01/11/2014     2:44:33 PM   CARE MANAGEMENT NOTE 01/11/2014  Patient:  Tony Sullivan, Tony Sullivan   Account Number:  [id number]  Date Initiated:  01/11/2014  Documentation initiated by:  Tomi Bamberger  Subjective/Objective Assessment:   dx dyspnea  admit as observation- from home.     Action/Plan:   Anticipated DC Date:  01/11/2014   Anticipated DC Plan:  HOME/SELF CARE      DC Planning Services  CM consult      Choice offered to / List presented to:             Status of service:  Completed, signed off Medicare Important Message given?   (If response is "NO", the following Medicare IM given date [place] will be blank) Date Medicare IM given:   Date Additional Medicare IM given:    Discharge Disposition:  HOME/SELF CARE  Per UR Regulation:  Reviewed for med. necessity/level of care/duration of stay  If discussed at [place] of Stay Meetings, dates discussed:    Comments:

## 2014-01-13 NOTE — Discharge Summary (Signed)
INTERNAL MEDICINE ATTENDING DISCHARGE COSIGN   I evaluated the patient on the day of discharge and discussed the discharge plan with my resident team. I agree with the discharge documentation and disposition.   Madilyn Fireman 01/13/2014, 2:55 PM

## 2014-02-11 ENCOUNTER — Other Ambulatory Visit: Payer: Self-pay | Admitting: Internal Medicine

## 2014-02-11 DIAGNOSIS — J449 Chronic obstructive pulmonary disease, unspecified: Secondary | ICD-10-CM

## 2014-02-14 ENCOUNTER — Ambulatory Visit (INDEPENDENT_AMBULATORY_CARE_PROVIDER_SITE_OTHER): Payer: Medicaid Other | Admitting: Internal Medicine

## 2014-02-14 ENCOUNTER — Encounter: Payer: Self-pay | Admitting: Internal Medicine

## 2014-02-14 VITALS — BP 132/70 | HR 82 | Ht 70.5 in | Wt 173.0 lb

## 2014-02-14 DIAGNOSIS — J9610 Chronic respiratory failure, unspecified whether with hypoxia or hypercapnia: Secondary | ICD-10-CM

## 2014-02-14 DIAGNOSIS — F17200 Nicotine dependence, unspecified, uncomplicated: Secondary | ICD-10-CM

## 2014-02-14 DIAGNOSIS — J449 Chronic obstructive pulmonary disease, unspecified: Secondary | ICD-10-CM

## 2014-02-14 LAB — PULMONARY FUNCTION TEST
DL/VA % pred: 37 %
DL/VA: 1.76 ml/min/mmHg/L
DLCO unc % pred: 32 %
DLCO unc: 10.66 ml/min/mmHg
FEF 25-75 Post: 0.57 L/sec
FEF 25-75 Pre: 0.69 L/sec
FEF2575-%Change-Post: -16 %
FEF2575-%Pred-Post: 17 %
FEF2575-%Pred-Pre: 20 %
FEV1-%Change-Post: -9 %
FEV1-%Pred-Post: 48 %
FEV1-%Pred-Pre: 53 %
FEV1-Post: 1.66 L
FEV1-Pre: 1.82 L
FEV1FVC-%Change-Post: -2 %
FEV1FVC-%Pred-Pre: 58 %
FEV6-%Change-Post: -5 %
FEV6-%Pred-Post: 83 %
FEV6-%Pred-Pre: 88 %
FEV6-Post: 3.47 L
FEV6-Pre: 3.68 L
FEV6FVC-%Change-Post: 1 %
FEV6FVC-%Pred-Post: 98 %
FEV6FVC-%Pred-Pre: 97 %
FVC-%Change-Post: -6 %
FVC-%Pred-Post: 85 %
FVC-%Pred-Pre: 91 %
FVC-Post: 3.64 L
FVC-Pre: 3.89 L
Post FEV1/FVC ratio: 46 %
Post FEV6/FVC ratio: 95 %
Pre FEV1/FVC ratio: 47 %
Pre FEV6/FVC Ratio: 94 %
RV % pred: 167 %
RV: 3.49 L
TLC % pred: 107 %
TLC: 7.59 L

## 2014-02-14 NOTE — Progress Notes (Signed)
PFT done today. 

## 2014-02-14 NOTE — Patient Instructions (Addendum)
Plan A =  Automatic =  symbicort 160 Take 2 puffs first thing in am and then another 2 puffs about 12 hours later.                                       spiriva in am only   Plan B = Backup- Proventil use it only if you can't your breath - ok to use up to every 4 hours if needed  Plan C = Nebulizer up to every 4 hours if needed only if you try plan B first and it doesn't work  The key is to stop smoking completely before smoking completely stops you!  02 should be 3lpm with more than 100 ft walking   Please schedule a follow up visit in 3 months but call sooner if needed

## 2014-02-14 NOTE — Progress Notes (Signed)
Subjective:    Patient ID: Tony Sullivan, male    DOB: 03/27/1961  MRN: [id number]  HPI  73 yobm active smoker with breathing difficulties since 2004 dx as copd on advair 250 /50 but getting worse since 2014 and placed on 02 for the first time in March 2015 and referred 01/07/2014 to pulmonary clinic by Dr Kennon Holter  01/07/2014 1st [facility] office visit/ Halton Neas  Chief Complaint  Patient presents with  . Advice Only    Referred for COPD, SOB.  Pt c/o SOB, low 02, prod cough with white mucous.  Pt forgot med list, unsure of what all he is taking  still can do a [employer] but uses [facility] parking, assoc with congested coughing esp in am worse in winter. rec Stop advair and The key is to stop smoking completely before smoking completely stops you!  Plan A =  Automatic =  symbicort 160 Take 2 puffs first thing in am and then another 2 puffs about 12 hours later. spiriva in am only  Plan B = Backup- Proventil use it only if you can't your breath - ok to use up to every 4 hours if needed Plan C = Nebulizer up to every 4 hours if needed only if you try plan B first and it doesn't work   02/14/2014 f/u ov/Francene Mcerlean re: copd GOLD II on ACEi and Advair and still smoking  Chief Complaint  Patient presents with  . Follow-up    Review pft.  Pt c/o prod cough with clear mucuos, SOB with exertion.  Interested in getting a portable 02 concentrator.        No obvious day to day or daytime variabilty or assoc   cp or chest tightness, subjective wheeze overt sinus or hb symptoms. No unusual exp hx or h/o childhood pna/ asthma or knowledge of premature birth.  Sleeping ok without nocturnal  or early am exacerbation  of respiratory  c/o's or need for noct saba. Also denies any obvious fluctuation of symptoms with weather or environmental changes or other aggravating or alleviating factors except as outlined above   Current Medications, Allergies, Complete Past Medical History, Past Surgical History, Family  History, and Social History were reviewed in [employer] record.  ROS  The following are not active complaints unless bolded sore throat, dysphagia, dental problems, itching, sneezing,  nasal congestion or excess/ purulent secretions, ear ache,   fever, chills, sweats, unintended wt loss, pleuritic or exertional cp, hemoptysis,  orthopnea pnd or leg swelling, presyncope, palpitations, heartburn, abdominal pain, anorexia, nausea, vomiting, diarrhea  or change in bowel or urinary habits, change in stools or urine, dysuria,hematuria,  rash, arthralgias, visual complaints, headache, numbness weakness or ataxia or problems with walking or coordination,  change in mood/affect or memory.                       Objective:   Physical Exam  Wt Readings from Last 3 Encounters:  02/14/14 173 lb (78.472 kg)  01/10/14 168 lb 1.6 oz (76.25 kg)  01/07/14 173 lb (78.472 kg)      amb hoarse bm nad on 02     HEENT mild turbinate edema.  Oropharynx no thrush or excess pnd or cobblestoning.  No JVD or cervical adenopathy. Mild accessory muscle hypertrophy. Trachea midline, nl thryroid. Chest was hyperinflated by percussion with diminished breath sounds and moderate increased exp time without wheeze. Hoover sign positive at mid inspiration. Regular rate and  rhythm without murmur gallop or rub or increase P2 or edema.  Abd: no hsm, nl excursion. Ext warm without cyanosis- mod bilateral clubbing     CXR  01/07/2014 :  Findings consistent with severe COPD, interstitial fibrosis, and pleural parenchymal scarring. No change from prior exam       Assessment & Plan:

## 2014-02-15 NOTE — Assessment & Plan Note (Addendum)
>   3 min discussion I reviewed the Flethcher curve with patient that basically indicates  if you quit smoking when your best day FEV1 is still well preserved (which is the case here)  it is highly unlikely you will progress to severe disease and informed the patient there was no medication on the market that has proven to change the curve or the likelihood of progression.  Therefore stopping smoking and maintaining abstinence is the most important aspect of care, not choice of inhalers or for that matter, doctors.

## 2014-02-15 NOTE — Assessment & Plan Note (Signed)
-   02/14/2014 p extensive coaching HFA effectiveness =    90% > try symbicort 160 2bid  - PFT's  02/14/2014   FEV1 1.82 (53%) and ratio 47 no better p alb (p spiriva and symbicort 160)  and DLCO 32 with 37%   Symptoms are markedly disproportionate to objective findings and not clear this is all a  lung problem (the desat with ex clearly is!) but pt does appear to have difficult airway management issues. DDX of  difficult airways managment all start with A and  include Adherence, Ace Inhibitors, Acid Reflux, Active Sinus Disease, Alpha 1 Antitripsin deficiency, Anxiety masquerading as Airways dz,  ABPA,  allergy(esp in young), Aspiration (esp in elderly), Adverse effects of DPI,  Active smokers, plus two Bs  = Bronchiectasis and Beta blocker use..and one C= CHF   Adherence is always the initial "prime suspect" and is a multilayered concern that requires a "trust but verify" approach in every patient - starting with knowing how to use medications, especially inhalers, correctly, keeping up with refills and understanding the fundamental difference between maintenance and prns vs those medications only taken for a very short course and then stopped and not refilled.  The proper method of use, as well as anticipated side effects, of a metered-dose inhaler are discussed and demonstrated to the patient. Improved effectiveness after extensive coaching during this visit to a level of approximately  90% so given trial of symbicort 160 2bid  ? acei effect > strongly prefer he be off this is symptoms of doe persist despite adequate 02 provided with ex or cough worse  Active smoking > discussed separately

## 2014-02-15 NOTE — Assessment & Plan Note (Signed)
-   started on 27 December 2013 by [facility] clinic - 02/14/2014  Walked RA x 3/4  laps @ 185 ft each stopped due to  desat 85% and corrected on 3lpm to x 2 laps    rec 3lpm with exertion outside house or more than 50 ft

## 2014-02-16 ENCOUNTER — Telehealth: Payer: Self-pay | Admitting: Internal Medicine

## 2014-02-16 DIAGNOSIS — J449 Chronic obstructive pulmonary disease, unspecified: Secondary | ICD-10-CM

## 2014-02-16 NOTE — Telephone Encounter (Signed)
Reviewed pt's chart.  Order was sent incorrectly at [facility].  Comments     3lpm with exertion portable system   Called spoke with patient and apologized for any inconvenience.  Advised pt order will be placed, he was unclear whether or not he wants a portable concentrator or just a more portable O2 system as he currently uses the larger tanks.  Order placed to [employer].  Pt aware to call the office if anything further is needed.  Nothing further needed, will sign off.

## 2014-02-18 ENCOUNTER — Telehealth: Payer: Self-pay | Admitting: Internal Medicine

## 2014-02-18 NOTE — Telephone Encounter (Signed)
Called and spoke with [employer] and they are aware that we will fax over additional information.  i called and spoke with pt and he is aware that this has been done but he may end up having to do the conserving test .  Pt voiced his understanding and nothing further is needed.

## 2014-03-02 ENCOUNTER — Encounter: Payer: Self-pay | Admitting: *Deleted

## 2014-03-02 ENCOUNTER — Telehealth: Payer: Self-pay | Admitting: Internal Medicine

## 2014-03-02 NOTE — Telephone Encounter (Signed)
Spoke with the pt and advised I will be happy to fax his letter stating that he was here on 02/14/14 He was unaware of the fax number  Gave me number to call- [phone]- I called and had to leave a msg  I have created the letter in the computer  Will await a call back and hold in my basket

## 2014-03-02 NOTE — Telephone Encounter (Signed)
Spoke with pt. He reports we were to fax medical transportation documentation that he was seen here 02/14/14. He reports he gave [place] the form to sign and fax. Nothing in pt chart. He reports they never received this. Please advise MW if okay to send a letter to them? thanks

## 2014-03-04 NOTE — Telephone Encounter (Signed)
Called # provided and had to [facility] for fax #

## 2014-03-07 NOTE — Telephone Encounter (Signed)
I called and fax # is [phone]. Letter printed and faxed. [place] Bing, CMA

## 2014-06-15 ENCOUNTER — Ambulatory Visit (INDEPENDENT_AMBULATORY_CARE_PROVIDER_SITE_OTHER): Payer: Medicaid Other | Admitting: Internal Medicine

## 2014-06-15 ENCOUNTER — Encounter: Payer: Self-pay | Admitting: Internal Medicine

## 2014-06-15 VITALS — BP 130/86 | HR 74 | Ht 71.0 in | Wt 163.0 lb

## 2014-06-15 DIAGNOSIS — R059 Cough, unspecified: Secondary | ICD-10-CM | POA: Diagnosis not present

## 2014-06-15 DIAGNOSIS — R05 Cough: Secondary | ICD-10-CM

## 2014-06-15 DIAGNOSIS — F17200 Nicotine dependence, unspecified, uncomplicated: Secondary | ICD-10-CM | POA: Diagnosis not present

## 2014-06-15 DIAGNOSIS — R0902 Hypoxemia: Secondary | ICD-10-CM

## 2014-06-15 DIAGNOSIS — J449 Chronic obstructive pulmonary disease, unspecified: Secondary | ICD-10-CM

## 2014-06-15 DIAGNOSIS — I10 Essential (primary) hypertension: Secondary | ICD-10-CM | POA: Diagnosis not present

## 2014-06-15 DIAGNOSIS — J4489 Other specified chronic obstructive pulmonary disease: Secondary | ICD-10-CM | POA: Diagnosis not present

## 2014-06-15 MED ORDER — VALSARTAN 160 MG PO TABS: 160.0000 mg | ORAL_TABLET | Freq: Every day | ORAL | Status: DC

## 2014-06-15 NOTE — Assessment & Plan Note (Signed)
No better off advair try off acei first

## 2014-06-15 NOTE — Progress Notes (Signed)
Subjective:    Patient ID: Tony Sullivan, male    DOB: [birth year]  MRN: [id number]    Brief patient profile:  52  yobm active smoker with breathing difficulties since 2004 dx as copd on advair 250 /50 but getting worse since 2014 and placed on 02 for the first time in March 2015 and referred 01/07/2014 to pulmonary clinic by Dr Kennon Holter with GOLD II copd criteria established 02/14/14    History of Present Illness  01/07/2014 1st [facility] office visit/ Tony Sullivan  Chief Complaint  Patient presents with  . Advice Only    Referred for COPD, SOB.  Pt c/o SOB, low 02, prod cough with white mucous.  Pt forgot med list, unsure of what all he is taking  still can do a [employer] but uses [facility] parking, assoc with congested coughing esp in am worse in winter. rec Stop advair and The key is to stop smoking completely before smoking completely stops you!  Plan A =  Automatic =  symbicort 160 Take 2 puffs first thing in am and then another 2 puffs about 12 hours later. spiriva in am only  Plan B = Backup- Proventil use it only if you can't your breath - ok to use up to every 4 hours if needed Plan C = Nebulizer up to every 4 hours if needed only if you try plan B first and it doesn't work   02/14/2014 f/u ov/Tony Sullivan re: copd GOLD II on ACEi and Advair and still smoking  Chief Complaint  Patient presents with  . Follow-up    Review pft.  Pt c/o prod cough with clear mucuos, SOB with exertion.  Interested in getting a portable 02 concentrator.    rec Plan A =  Automatic =  symbicort 160 Take 2 puffs first thing in am and then another 2 puffs about 12 hours later.                                       spiriva in am only  Plan B = Backup- Proventil use it only if you can't your breath - ok to use up to every 4 hours if needed Plan C = Nebulizer up to every 4 hours if needed only if you try plan B first and it doesn't work The key is to stop smoking completely before smoking completely stops you! 02  should be 3lpm with more than 100 ft walking  Please schedule a follow up visit in 3 months but call sooner if needed    06/15/2014 f/u ov/Tony Sullivan re: still smoking/ GOLD II copd 02 dep with ambulation / on spiriva/symbicort Chief Complaint  Patient presents with  . Follow-up    Pt reports that cough and SOB has been more increased recently d/t weather change. Using 2.5Liter O2    No need for saba yet in any form / dry hacking cough   No obvious day to day or daytime variabilty or assoc excess or purulent sputum or   cp or chest tightness, subjective wheeze overt sinus or hb symptoms. No unusual exp hx or h/o childhood pna/ asthma or knowledge of premature birth.  Sleeping ok without nocturnal  or early am exacerbation  of respiratory  c/o's or need for noct saba. Also denies any obvious fluctuation of symptoms with weather or environmental changes or other aggravating or alleviating factors except as outlined above  Current Medications, Allergies, Complete Past Medical History, Past Surgical History, Family History, and Social History were reviewed in [employer] record.  ROS  The following are not active complaints unless bolded sore throat, dysphagia, dental problems, itching, sneezing,  nasal congestion or excess/ purulent secretions, ear ache,   fever, chills, sweats, unintended wt loss, pleuritic or exertional cp, hemoptysis,  orthopnea pnd or leg swelling, presyncope, palpitations, heartburn, abdominal pain, anorexia, nausea, vomiting, diarrhea  or change in bowel or urinary habits, change in stools or urine, dysuria,hematuria,  rash, arthralgias, visual complaints, headache, numbness weakness or ataxia or problems with walking or coordination,  change in mood/affect or memory.                       Objective:   Physical Exam   06/15/2014        164  Wt Readings from Last 3 Encounters:  02/14/14 173 lb (78.472 kg)  01/10/14 168 lb 1.6 oz (76.25 kg)   01/07/14 173 lb (78.472 kg)      amb very hoarse bm nad on 02     HEENT mild turbinate edema.  Oropharynx no thrush or excess pnd or cobblestoning.  No JVD or cervical adenopathy. Mild accessory muscle hypertrophy. Trachea midline, nl thryroid. Chest was hyperinflated by percussion with diminished breath sounds and moderate increased exp time without wheeze. Hoover sign positive at mid inspiration. Regular rate and rhythm without murmur gallop or rub or increase P2 or edema.  Abd: no hsm, nl excursion. Ext warm without cyanosis- mod bilateral clubbing     CXR  01/07/2014 :  Findings consistent with severe COPD, interstitial fibrosis, and pleural parenchymal scarring. No change from prior exam       Assessment & Plan:

## 2014-06-15 NOTE — Assessment & Plan Note (Signed)

## 2014-06-15 NOTE — Patient Instructions (Addendum)
Stop lisinopril  Start valsartan 160 mg one daily   Please see patient coordinator before you leave today  to schedule ambulatory portable system that will deliver  02 at 2.5 lpm to replace your tank  Please schedule a follow up office visit in 6 weeks, call sooner if needed to see Tammy NP

## 2014-06-15 NOTE — Assessment & Plan Note (Signed)

## 2014-06-15 NOTE — Assessment & Plan Note (Signed)
-   started on 27 December 2013 by [facility] clinic - 02/14/2014  Walked RA x 3/4  laps @ 185 ft each stopped due to  desat 85% and corrected on 3lpm to x 2 laps  - 06/15/2014  Walked 2.5lpm  2 laps  @ 185 ft each stopped due to  Sob adequate sats    As of 06/15/2014  rec  2.5 lpm  with exertion outside house or more than 50 ft / portable system for this

## 2014-06-16 NOTE — Assessment & Plan Note (Addendum)
-   02/14/2014 p extensive coaching HFA effectiveness =    90% > try symbicort 160 2bid  - PFT's  02/14/2014   FEV1 1.82 (53%) and ratio 47 no better p alb (p spiriva and symbicort 160)  and DLCO 32 with 37%    DDX of  difficult airways management all start with A and  include Adherence, Ace Inhibitors, Acid Reflux, Active Sinus Disease, Alpha 1 Antitripsin deficiency, Anxiety masquerading as Airways dz,  ABPA,  allergy(esp in young), Aspiration (esp in elderly), Adverse effects of DPI,  Active smokers, plus two Bs  = Bronchiectasis and Beta blocker use..and one C= CHF  Adherence is always the initial "prime suspect" and is a multilayered concern that requires a "trust but verify" approach in every patient - starting with knowing how to use medications, especially inhalers, correctly, keeping up with refills and understanding the fundamental difference between maintenance and prns vs those medications only taken for a very short course and then stopped and not refilled.   Active smoking greatest concern > discussed separately   ? acei effect causing hoarseness / cough > only way to know is try off   See instructions for specific recommendations which were reviewed directly with the patient who was given a copy with highlighter outlining the key components.

## 2014-06-17 ENCOUNTER — Telehealth: Payer: Self-pay | Admitting: Internal Medicine

## 2014-06-17 NOTE — Telephone Encounter (Signed)
Form was given to [facility] and placed in [place] look at to be completed.  Will forward message to MW.

## 2014-06-21 NOTE — Telephone Encounter (Signed)
Done and faxed to [employer]

## 2014-06-21 NOTE — Telephone Encounter (Signed)
done

## 2014-07-09 ENCOUNTER — Encounter (HOSPITAL_COMMUNITY): Payer: Self-pay | Admitting: Emergency Medicine

## 2014-07-09 ENCOUNTER — Emergency Department (HOSPITAL_COMMUNITY): Payer: Medicaid Other

## 2014-07-09 ENCOUNTER — Emergency Department (HOSPITAL_COMMUNITY)
Admission: EM | Admit: 2014-07-09 | Discharge: 2014-07-09 | Disposition: A | Discharge status: Home / Self Care | Payer: Medicaid Other | Attending: Emergency Medicine | Admitting: Emergency Medicine

## 2014-07-09 DIAGNOSIS — IMO0002 Reserved for concepts with insufficient information to code with codable children: Secondary | ICD-10-CM | POA: Insufficient documentation

## 2014-07-09 DIAGNOSIS — F17200 Nicotine dependence, unspecified, uncomplicated: Secondary | ICD-10-CM | POA: Insufficient documentation

## 2014-07-09 DIAGNOSIS — Z79899 Other long term (current) drug therapy: Secondary | ICD-10-CM | POA: Insufficient documentation

## 2014-07-09 DIAGNOSIS — I10 Essential (primary) hypertension: Secondary | ICD-10-CM | POA: Insufficient documentation

## 2014-07-09 DIAGNOSIS — Z880 Allergy status to penicillin: Secondary | ICD-10-CM | POA: Insufficient documentation

## 2014-07-09 DIAGNOSIS — Z791 Long term (current) use of non-steroidal anti-inflammatories (NSAID): Secondary | ICD-10-CM | POA: Diagnosis not present

## 2014-07-09 DIAGNOSIS — J441 Chronic obstructive pulmonary disease with (acute) exacerbation: Secondary | ICD-10-CM | POA: Diagnosis not present

## 2014-07-09 LAB — I-STAT TROPONIN, ED: Troponin i, poc: 0 ng/mL (ref 0.00–0.08)

## 2014-07-09 LAB — CBC
HCT: 49.1 % (ref 39.0–52.0)
Hemoglobin: 17 g/dL (ref 13.0–17.0)
MCH: 33.5 pg (ref 26.0–34.0)
MCHC: 34.6 g/dL (ref 30.0–36.0)
MCV: 96.7 fL (ref 78.0–100.0)
Platelets: 257 10*3/uL (ref 150–400)
RBC: 5.08 MIL/uL (ref 4.22–5.81)
RDW: 14.4 % (ref 11.5–15.5)
WBC: 11.3 10*3/uL — ABNORMAL HIGH (ref 4.0–10.5)

## 2014-07-09 LAB — BASIC METABOLIC PANEL
Anion gap: 11 (ref 5–15)
BUN: 8 mg/dL (ref 6–23)
CO2: 26 mEq/L (ref 19–32)
Calcium: 9.5 mg/dL (ref 8.4–10.5)
Chloride: 105 mEq/L (ref 96–112)
Creatinine, Ser: 0.87 mg/dL (ref 0.50–1.35)
GFR calc Af Amer: >90 mL/min (ref >90)
GFR calc non Af Amer: >90 mL/min (ref >90)
Glucose, Bld: 83 mg/dL (ref 70–99)
Potassium: 4.4 mEq/L (ref 3.7–5.3)
Sodium: 142 mEq/L (ref 137–147)

## 2014-07-09 LAB — TROPONIN I
Troponin I: <0.3 ng/mL (ref <0.30)
Troponin I: <0.3 ng/mL (ref <0.30)

## 2014-07-09 LAB — D-DIMER, QUANTITATIVE: D-Dimer, Quant: 0.31 ug/mL-FEU (ref 0.00–0.48)

## 2014-07-09 LAB — PRO B NATRIURETIC PEPTIDE: Pro B Natriuretic peptide (BNP): 29.2 pg/mL (ref 0–125)

## 2014-07-09 IMAGING — CR DG CHEST 2V
2 series · 2 of 2 positions shown · non-contrast
Comparison: Chest radiograph [DATE]

CLINICAL DATA: Shortness of breath for 5 days and cough

EXAM:
CHEST  2 VIEW

[w chest pa]
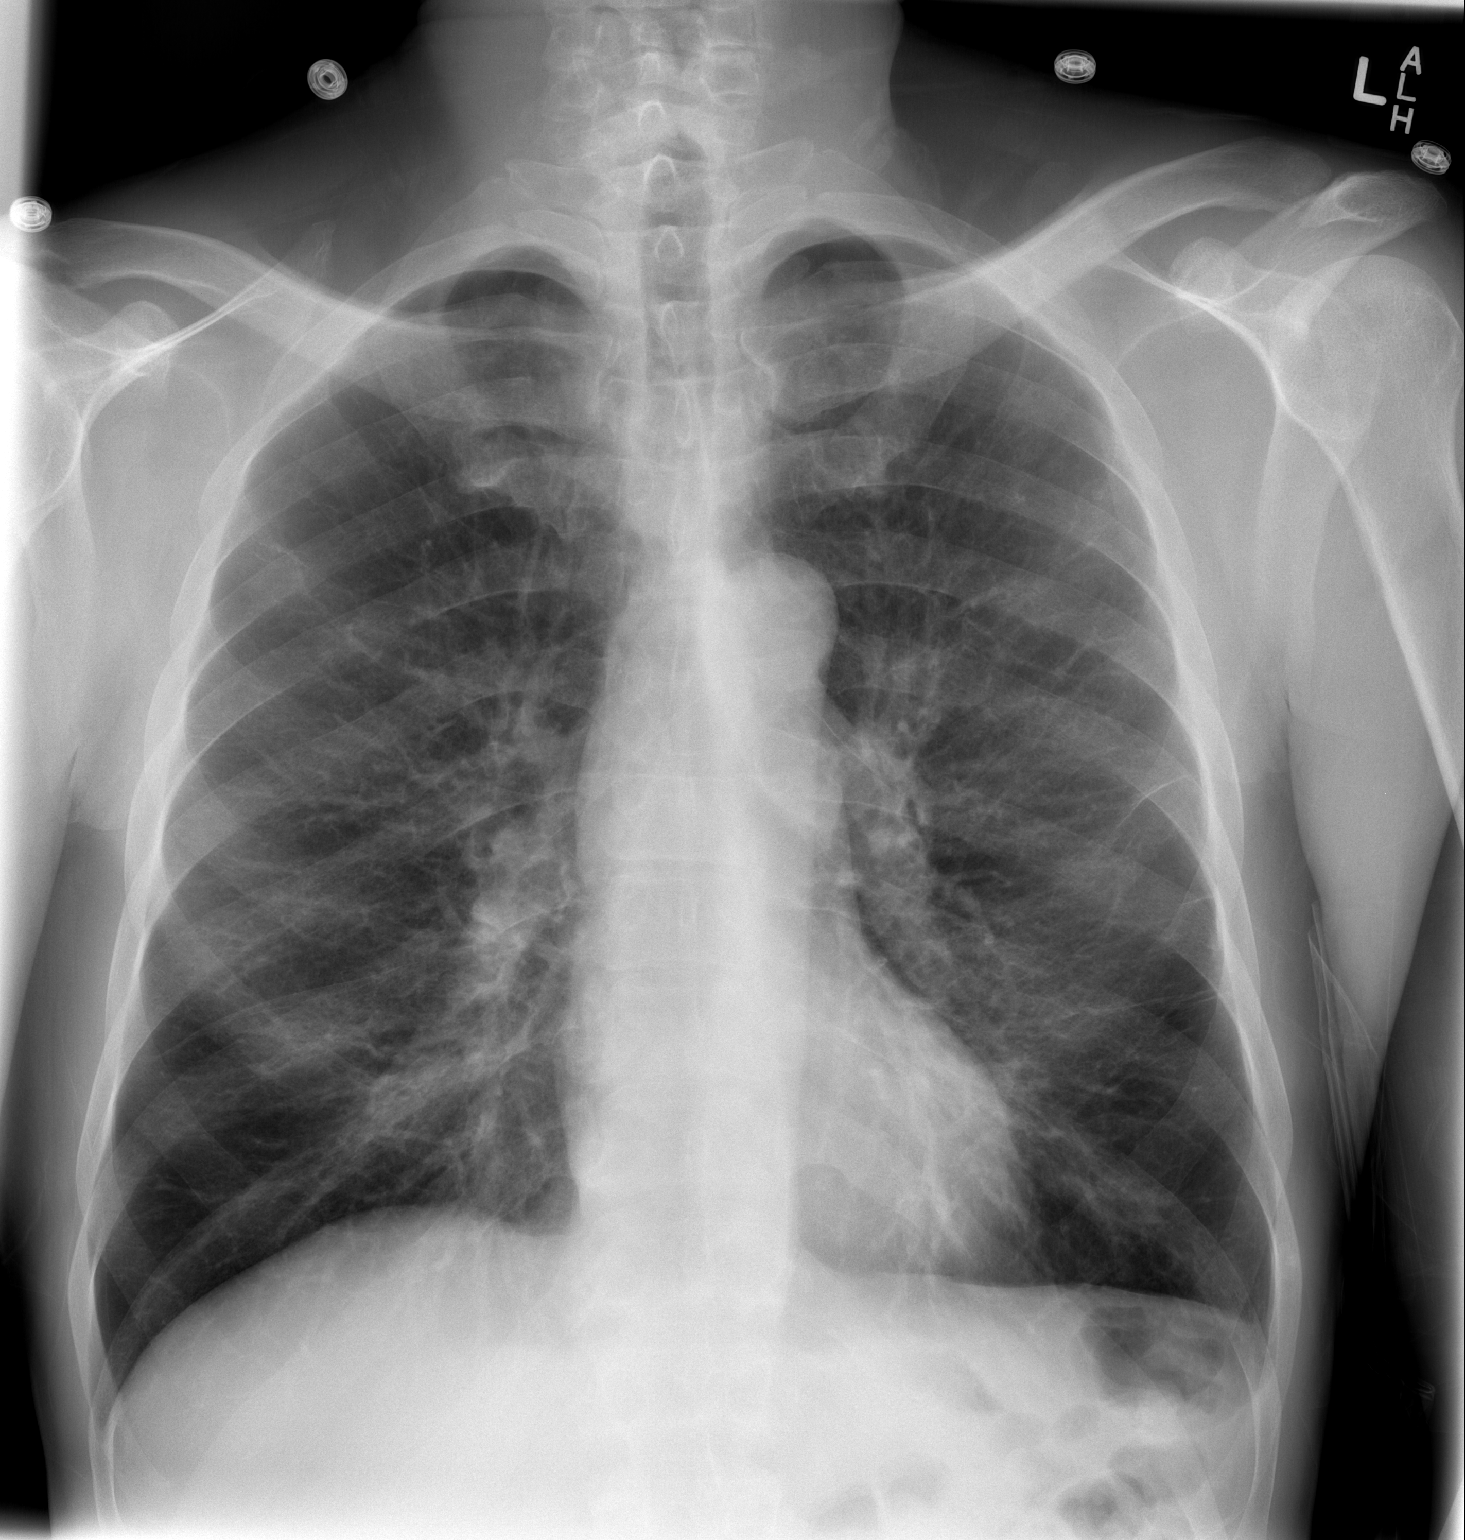

[w chest lat]
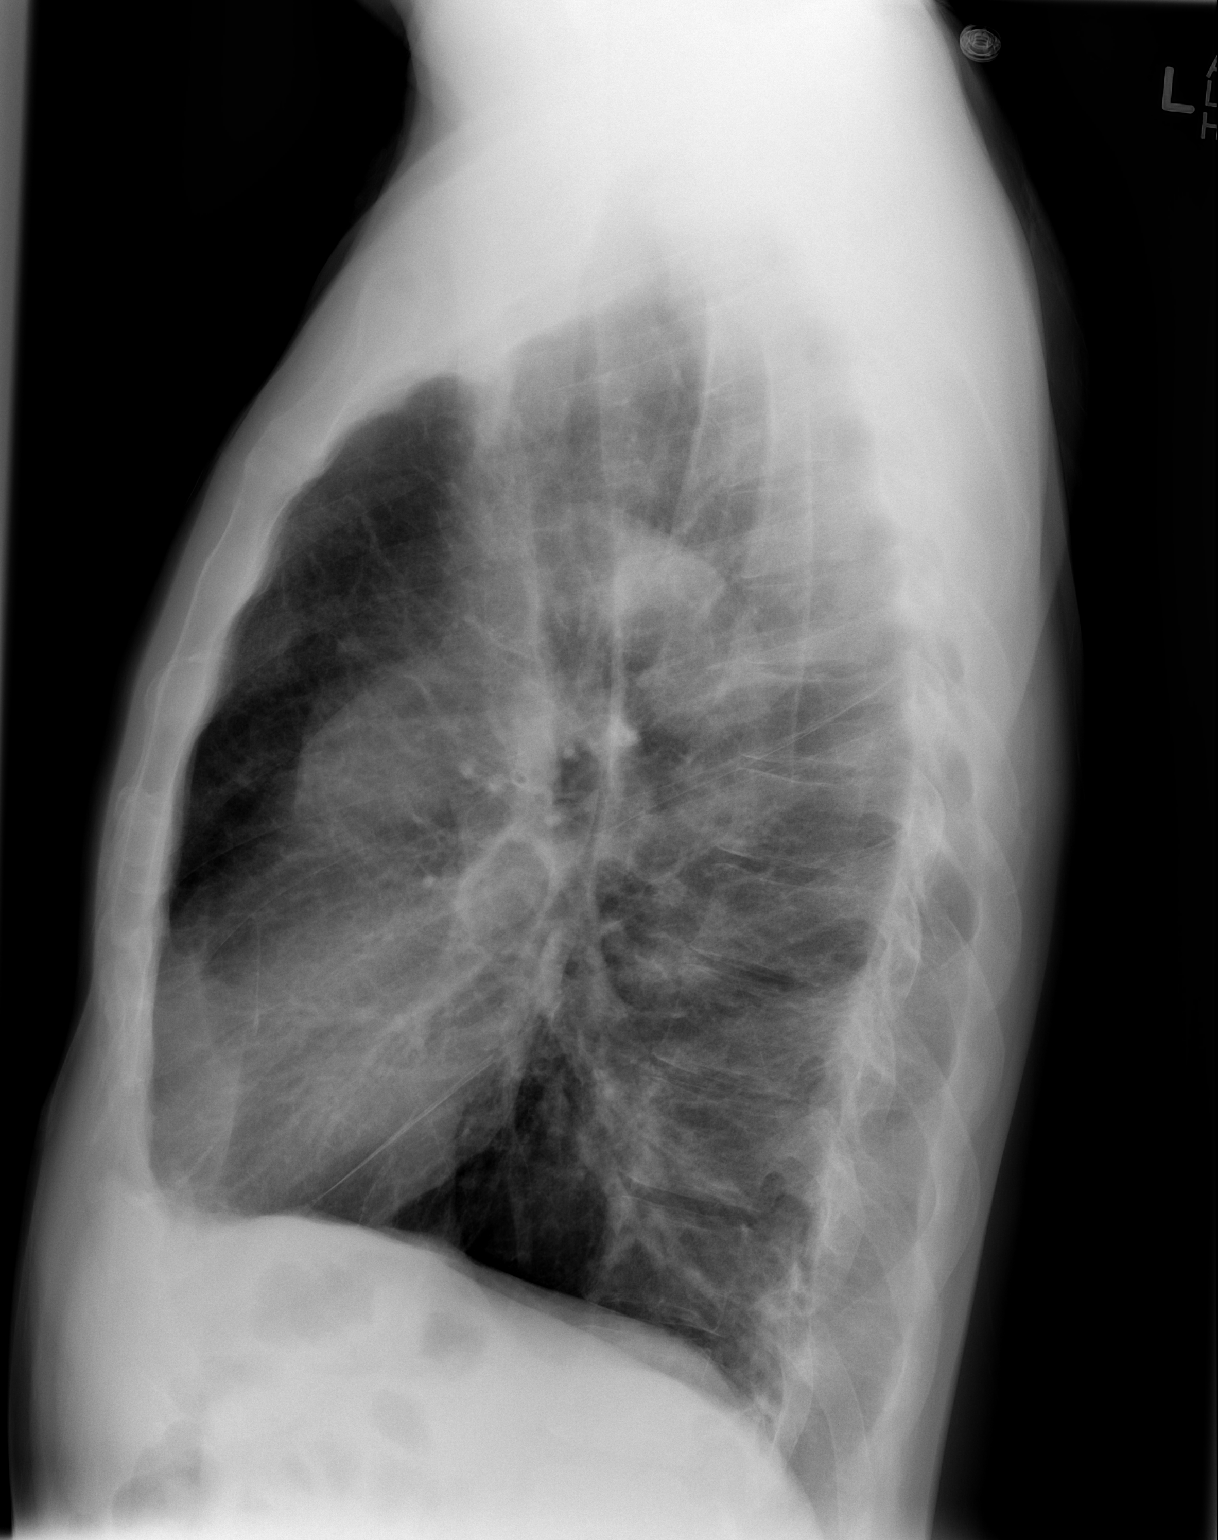

[2 of 2 positions shown; findings below may reference images not displayed]

FINDINGS: Normal heart and mediastinal contours. Slight prominence of the
central pulmonary arteries bilaterally is stable and may reflect
pulmonary arterial hypertension. Lungs are hyperinflated and there
are emphysematous changes. No airspace disease, pleural effusion, or
pneumothorax. The trachea is midline. Bony thorax is intact.
IMPRESSION: Emphysema.  No acute superimposed abnormality identified.

## 2014-07-09 MED ORDER — IPRATROPIUM-ALBUTEROL 0.5-2.5 (3) MG/3ML IN SOLN
3.0000 mL | Freq: Once | RESPIRATORY_TRACT | Status: AC
  Administered 2014-07-09: 3 mL via RESPIRATORY_TRACT
  Filled 2014-07-09: qty 3

## 2014-07-09 MED ORDER — DOXYCYCLINE HYCLATE 100 MG PO CAPS: 100.0000 mg | ORAL_CAPSULE | Freq: Two times a day (BID) | ORAL | Status: DC

## 2014-07-09 MED ORDER — PREDNISONE 50 MG PO TABS: ORAL_TABLET | ORAL | Status: DC

## 2014-07-09 MED ORDER — METHYLPREDNISOLONE SODIUM SUCC 125 MG IJ SOLR
125.0000 mg | Freq: Once | INTRAMUSCULAR | Status: AC
  Administered 2014-07-09: 125 mg via INTRAVENOUS
  Filled 2014-07-09: qty 2

## 2014-07-09 MED ORDER — ALBUTEROL SULFATE HFA 108 (90 BASE) MCG/ACT IN AERS: 1.0000 | INHALATION_SPRAY | Freq: Four times a day (QID) | RESPIRATORY_TRACT | Status: DC | PRN

## 2014-07-09 NOTE — ED Notes (Signed)
Spoke with Case Manager regarding medication get filled. Patient stated uses mail order.  Case Manager researching for patient.

## 2014-07-09 NOTE — Discharge Instructions (Signed)
Chronic Obstructive Pulmonary Disease Follow up with your doctor this week. Return to the ED if you develop worsening trouble breathing, chest pain or any other concerns. Chronic obstructive pulmonary disease (COPD) is a common lung condition in which airflow from the lungs is limited. COPD is a general term that can be used to describe many different lung problems that limit airflow, including both chronic bronchitis and emphysema. If you have COPD, your lung function will probably never return to normal, but there are measures you can take to improve lung function and make yourself feel better.  CAUSES   Smoking (common).   Exposure to secondhand smoke.   Genetic problems.  Chronic inflammatory lung diseases or recurrent infections. SYMPTOMS   Shortness of breath, especially with physical activity.   Deep, persistent (chronic) cough with a large amount of thick mucus.   Wheezing.   Rapid breaths (tachypnea).   Gray or bluish discoloration (cyanosis) of the skin, especially in fingers, toes, or lips.   Fatigue.   Weight loss.   Frequent infections or episodes when breathing symptoms become much worse (exacerbations).   Chest tightness. DIAGNOSIS  Your health care provider will take a medical history and perform a physical examination to make the initial diagnosis. Additional tests for COPD may include:   Lung (pulmonary) function tests.  Chest X-ray.  CT scan.  Blood tests. TREATMENT  Treatment available to help you feel better when you have COPD includes:   Inhaler and nebulizer medicines. These help manage the symptoms of COPD and make your breathing more comfortable.  Supplemental oxygen. Supplemental oxygen is only helpful if you have a low oxygen level in your blood.   Exercise and physical activity. These are beneficial for nearly all people with COPD. Some people may also benefit from a pulmonary rehabilitation program. HOME CARE INSTRUCTIONS    Take all medicines (inhaled or pills) as directed by your health care provider.  Avoid over-the-counter medicines or cough syrups that dry up your airway (such as antihistamines) and slow down the elimination of secretions unless instructed otherwise by your health care provider.   If you are a smoker, the most important thing that you can do is stop smoking. Continuing to smoke will cause further lung damage and breathing trouble. Ask your health care provider for help with quitting smoking. He or she can direct you to community resources or hospitals that provide support.  Avoid exposure to irritants such as smoke, chemicals, and fumes that aggravate your breathing.  Use oxygen therapy and pulmonary rehabilitation if directed by your health care provider. If you require home oxygen therapy, ask your health care provider whether you should purchase a pulse oximeter to measure your oxygen level at home.   Avoid contact with individuals who have a contagious illness.  Avoid extreme temperature and humidity changes.  Eat healthy foods. Eating smaller, more frequent meals and resting before meals may help you maintain your strength.  Stay active, but balance activity with periods of rest. Exercise and physical activity will help you maintain your ability to do things you want to do.  Preventing infection and hospitalization is very important when you have COPD. Make sure to receive all the vaccines your health care provider recommends, especially the pneumococcal and influenza vaccines. Ask your health care provider whether you need a pneumonia vaccine.  Learn and use relaxation techniques to manage stress.  Learn and use controlled breathing techniques as directed by your health care provider. Controlled breathing techniques include:  Pursed lip breathing. Start by breathing in (inhaling) through your nose for 1 second. Then, purse your lips as if you were going to whistle and breathe  out (exhale) through the pursed lips for 2 seconds.   Diaphragmatic breathing. Start by putting one hand on your abdomen just above your waist. Inhale slowly through your nose. The hand on your abdomen should move out. Then purse your lips and exhale slowly. You should be able to feel the hand on your abdomen moving in as you exhale.   Learn and use controlled coughing to clear mucus from your lungs. Controlled coughing is a series of short, progressive coughs. The steps of controlled coughing are:  1. Lean your head slightly forward.  2. Breathe in deeply using diaphragmatic breathing.  3. Try to hold your breath for 3 seconds.  4. Keep your mouth slightly open while coughing twice.  5. Spit any mucus out into a tissue.  6. Rest and repeat the steps once or twice as needed. SEEK MEDICAL CARE IF:   You are coughing up more mucus than usual.   There is a change in the color or thickness of your mucus.   Your breathing is more labored than usual.   Your breathing is faster than usual.  SEEK IMMEDIATE MEDICAL CARE IF:   You have shortness of breath while you are resting.   You have shortness of breath that prevents you from:  Being able to talk.   Performing your usual physical activities.   You have chest pain lasting longer than 5 minutes.   Your skin color is more cyanotic than usual.  You measure low oxygen saturations for longer than 5 minutes with a pulse oximeter. MAKE SURE YOU:   Understand these instructions.  Will watch your condition.  Will get help right away if you are not doing well or get worse. Document Released: 07/24/2005 Document Revised: 02/28/2014 Document Reviewed: 06/10/2013 [facility] Patient Information 2015 [place], [place]. This information is not intended to replace advice given to you by your health care provider. Make sure you discuss any questions you have with your health care provider.

## 2014-07-09 NOTE — ED Notes (Signed)
Spoke to lab for status on test results. Stated waiting for specimen to be collected. Spoke with phlebotomy and will look for specimen if not found will have to redraw. Doctor notified.

## 2014-07-09 NOTE — ED Notes (Signed)
Patient ambulatory steady gait with single pole cane. States feels better "I feel opened up". Pulse oximetry 92% [place] 2L before ambulating. During ambulating 88% room air and placed on [place] 2L improved to 92%.  Patient states normally on room air 88%-90% and on [place] 2L 93%-95%.  Doctor notified. Patient given [place] sandwich, apple sauce, and cranberry juice.

## 2014-07-09 NOTE — ED Provider Notes (Signed)
CSN: [id number]     Arrival date & time 07/09/14  3614 History   First MD Initiated Contact with Patient 07/09/14 1006     Chief Complaint  Patient presents with  . Shortness of Breath     (Consider location/radiation/quality/duration/timing/severity/associated sxs/prior Treatment) HPI Comments: Patient with COPD on oxygen as needed at home. He's been wearing it constantly for the past week. States he has had upper respiratory symptoms of cough, nasal congestion, chills, body aches and headache. Feels increasingly short of breath for the past week. Cough productive of yellow and green mucus. Has some chest tightness with coughing. Denies any leg pain or leg swelling. Denies abdominal pain, nausea or vomiting. No fever. He continues to smoke 5 cigarettes a day. He takes spiriva and symbicort for his COPD. He last saw Dr. [name] 2 weeks ago. Denies any chest pain with exertion. No dizziness or lightheadedness.  The history is provided by the patient.    Past Medical History  Diagnosis Date  . High blood pressure   . COPD (chronic obstructive pulmonary disease) 2004    diagnosed in 2004, no PFT's to date.  Started on home O2 12/2013, after found to be desatting at PCP's office, and referred to pulmonology.  . Seasonal allergies    Past Surgical History  Procedure Laterality Date  . Appendectomy  1980  . Hip surgery Right   . Shoulder arthroscopy Right    Family History  Problem Relation Age of Onset  . Emphysema Mother   . Emphysema Father   . Allergies Mother     "everyone in family"  . Allergies Father   . Allergies Son   . Asthma Mother     "everyone in family"  . Heart disease Mother   . Heart disease Maternal Aunt   . Clotting disorder Mother   . Cancer Mother    History  Substance Use Topics  . Smoking status: Current Every Day Smoker -- 0.10 packs/day for 36 years    Types: Cigarettes  . Smokeless tobacco: Never Used     Comment: Was up to 2 ppd, down to 2 cigs/day.   . Alcohol Use: 7.0 - 10.5 oz/week    14-21 drink(s) per week     Comment: 2-3 beers/day, with occasional "binges".  No history of withdrawal    Review of Systems  Constitutional: Positive for activity change, appetite change and fatigue. Negative for fever.  HENT: Positive for congestion and rhinorrhea.   Respiratory: Positive for cough, chest tightness and shortness of breath.   Cardiovascular: Negative for chest pain.  Gastrointestinal: Negative for nausea, vomiting and abdominal pain.  Genitourinary: Negative for dysuria and testicular pain.  Musculoskeletal: Negative for arthralgias, back pain and myalgias.  Skin: Negative for rash.  Neurological: Negative for dizziness, weakness and headaches.  A complete 10 system review of systems was obtained and all systems are negative except as noted in the HPI and PMH.      Allergies  Penicillins and Sulfa antibiotics  Home Medications   Prior to Admission medications   Medication Sig Start Date End Date Taking? Authorizing Provider  albuterol (PROVENTIL) (2.5 MG/3ML) 0.083% nebulizer solution Take 2.5 mg by nebulization every 6 (six) hours as needed for wheezing or shortness of breath.   Yes Historical Provider, MD  amLODipine (NORVASC) 10 MG tablet Take 10 mg by mouth daily.   Yes Historical Provider, MD  budesonide-formoterol (SYMBICORT) 160-4.5 MCG/ACT inhaler Inhale 2 puffs into the lungs every 12 (twelve) hours.  Take 2 puffs first thing in am and then another 2 puffs about 12 hours later. 01/07/14  Yes [name], MD  cetirizine (ZYRTEC) 10 MG tablet Take 10 mg by mouth daily.   Yes Historical Provider, MD  loxapine (LOXITANE) 10 MG capsule Take 10 mg by mouth daily.   Yes Historical Provider, MD  meloxicam (MOBIC) 7.5 MG tablet Take 7.5 mg by mouth daily.   Yes Historical Provider, MD  mirtazapine (REMERON) 30 MG tablet Take 30 mg by mouth at bedtime.   Yes Historical Provider, MD  omeprazole (PRILOSEC) 40 MG capsule Take 40  mg by mouth daily.   Yes Historical Provider, MD  risperiDONE (RISPERDAL) 2 MG tablet Take 2 mg by mouth at bedtime.   Yes Historical Provider, MD  tiotropium (SPIRIVA) 18 MCG inhalation capsule Place 18 mcg into inhaler and inhale daily.   Yes Historical Provider, MD  traMADol (ULTRAM) 50 MG tablet Take 50 mg by mouth every 6 (six) hours as needed for moderate pain.   Yes Historical Provider, MD  valsartan (DIOVAN) 160 MG tablet Take 1 tablet (160 mg total) by mouth daily. 06/15/14  Yes [name], MD  albuterol (PROVENTIL HFA;VENTOLIN HFA) 108 (90 BASE) MCG/ACT inhaler Inhale 1-2 puffs into the lungs every 6 (six) hours as needed for wheezing or shortness of breath. 07/09/14   Ezequiel Essex, MD  doxycycline (VIBRAMYCIN) 100 MG capsule Take 1 capsule (100 mg total) by mouth 2 (two) times daily. 07/09/14   Ezequiel Essex, MD  predniSONE (DELTASONE) 50 MG tablet 1 tablet PO daily 07/09/14   Ezequiel Essex, MD   BP 123/83  Pulse 89  Temp(Src) 98 F (36.7 C) (Oral)  Resp 18  SpO2 95% Physical Exam  Nursing note and vitals reviewed. Constitutional: He is oriented to person, place, and time. He appears well-developed and well-nourished. No distress.  Speaking in full sentences  HENT:  Head: Normocephalic and atraumatic.  Mouth/Throat: Oropharynx is clear and moist. No oropharyngeal exudate.  Eyes: Conjunctivae and EOM are normal. Pupils are equal, round, and reactive to light.  Neck: Normal range of motion. Neck supple.  No meningismus.  Cardiovascular: Normal rate, regular rhythm, normal heart sounds and intact distal pulses.   No murmur heard. Pulmonary/Chest: Effort normal and breath sounds normal. No respiratory distress. He has no wheezes.  Decreased air exchange bilaterally. No appreciable wheezing  Abdominal: Soft. There is no tenderness. There is no rebound and no guarding.  Musculoskeletal: Normal range of motion. He exhibits no edema and no tenderness.  Neurological: He is  alert and oriented to person, place, and time. No cranial nerve deficit. He exhibits normal muscle tone. Coordination normal.  No ataxia on finger to nose bilaterally. No pronator drift. 5/5 strength throughout. CN 2-12 intact. Negative Romberg. Equal grip strength. Sensation intact. Gait is normal.   Skin: Skin is warm.  Psychiatric: He has a normal mood and affect. His behavior is normal.    ED Course  Procedures (including critical care time) Labs Review Labs Reviewed  CBC - Abnormal; Notable for the following:    WBC 11.3 (*)    All other components within normal limits  BASIC METABOLIC PANEL  D-DIMER, QUANTITATIVE  PRO B NATRIURETIC PEPTIDE  TROPONIN I  TROPONIN I  I-STAT TROPOININ, ED    Imaging Review Dg Chest 2 View (if Patient Has Fever And/or Copd)  07/09/2014   CLINICAL DATA:  Shortness of breath for 5 days and cough  EXAM: CHEST  2 VIEW  COMPARISON:  Chest radiograph 01/10/2014  FINDINGS: Normal heart and mediastinal contours. Slight prominence of the central pulmonary arteries bilaterally is stable and may reflect pulmonary arterial hypertension. Lungs are hyperinflated and there are emphysematous changes. No airspace disease, pleural effusion, or pneumothorax. The trachea is midline. Bony thorax is intact.  IMPRESSION: Emphysema.  No acute superimposed abnormality identified.   Electronically Signed   By: Curlene Dolphin M.D.   On: 07/09/2014 11:42     EKG Interpretation   Date/Time:  Saturday July 09 2014 09:32:31 EDT Ventricular Rate:  96 PR Interval:  150 QRS Duration: 76 QT Interval:  348 QTC Calculation: 439 R Axis:   93 Text Interpretation:  Normal sinus rhythm Biatrial enlargement Rightward  axis Septal infarct , age undetermined Abnormal ECG No significant change  was found Confirmed by Wyvonnia Dusky  MD, Kalicia Dufresne [phone]) on 07/09/2014 10:04:14  AM      MDM   Final diagnoses:  COPD exacerbation   Upper respiratory symptoms of cough and congestion for  the past week. History of COPD on home oxygen as needed. Denies chest pain but has some tightness with coughing. Patient given nebulizers and steroids. His EKG is unchanged. Chest x-ray shows no infiltrate. D-dimer is negative. Troponin is negative.  No wheezing on exam. Air exchange improved. He is able to ambulate and maintain his saturations greater than 90%. He has home oxygen as needed. Troponin negative x2. Low suspicion for ACS. Doubt PE with negative d-dimer.appears stable for discharge with nebs, steroids, antibiotics, smoking cessation.  Follow up with PCP. Return precautions discussed.  BP 123/83  Pulse 89  Temp(Src) 98 F (36.7 C) (Oral)  Resp 18  SpO2 95%     Ezequiel Essex, MD 07/09/14 1721

## 2014-07-09 NOTE — ED Notes (Signed)
He states hes had cough/cold symptoms this past week. He has hx COPD and states hes felt increasingly SOB with chest tightness this week. Hes been using his O2 prn but states "its not helping because i cant breathe through my nose."

## 2014-07-09 NOTE — ED Notes (Signed)
Case Manager at bedside

## 2014-07-10 NOTE — Progress Notes (Addendum)
Case Manager consult for Discharge assistance.Role of Case Manager explained to patient.Patient verbalizes understanding .Patient reports he has [place] but is unable to fill his prescriptions from ED  As [employer] his pharmacy is closed . RN unable to Match patient due to his Insurance.CM contacted Inpatient pharmacy at [facility] and clarified spoke with Lacie Scotts clarified patient can use any  Pharmacy to get his scripts filled as his MEDICAID will show in the system.Patient educated with this information and reports he will get his scripts filled at his local [facility].No further CM needs.Patients RN Updated.

## 2014-07-27 ENCOUNTER — Ambulatory Visit: Payer: Medicaid Other | Admitting: Internal Medicine

## 2014-08-03 ENCOUNTER — Encounter: Payer: Self-pay | Admitting: Internal Medicine

## 2014-08-03 ENCOUNTER — Ambulatory Visit (INDEPENDENT_AMBULATORY_CARE_PROVIDER_SITE_OTHER): Payer: Medicaid Other | Admitting: Internal Medicine

## 2014-08-03 ENCOUNTER — Encounter (INDEPENDENT_AMBULATORY_CARE_PROVIDER_SITE_OTHER): Payer: Self-pay

## 2014-08-03 VITALS — BP 108/80 | HR 96 | Temp 97.1°F | Ht 71.0 in | Wt 160.0 lb

## 2014-08-03 DIAGNOSIS — Z23 Encounter for immunization: Secondary | ICD-10-CM

## 2014-08-03 DIAGNOSIS — R0902 Hypoxemia: Secondary | ICD-10-CM

## 2014-08-03 DIAGNOSIS — F17200 Nicotine dependence, unspecified, uncomplicated: Secondary | ICD-10-CM

## 2014-08-03 DIAGNOSIS — J449 Chronic obstructive pulmonary disease, unspecified: Secondary | ICD-10-CM

## 2014-08-03 DIAGNOSIS — Z720 Tobacco use: Secondary | ICD-10-CM

## 2014-08-03 NOTE — Progress Notes (Signed)
Subjective:    Patient ID: Tony Sullivan, male    DOB: Nov 27, 1960  MRN: [id number]    Brief patient profile:  72  yobm active smoker with breathing difficulties since 2004 dx as copd on advair 250 /50 but getting worse since 2014 and placed on 02 for the first time in March 2015 and referred 01/07/2014 to pulmonary clinic by Dr Kennon Holter with GOLD II copd criteria established 02/14/14    History of Present Illness  01/07/2014 1st [facility] office visit/ Tony Sullivan  Chief Complaint  Patient presents with  . Advice Only    Referred for COPD, SOB.  Pt c/o SOB, low 02, prod cough with white mucous.  Pt forgot med list, unsure of what all he is taking  still can do a [employer] but uses [employer] parking, assoc with congested coughing esp in am worse in winter. rec Stop advair and The key is to stop smoking completely before smoking completely stops you!  Plan A =  Automatic =  symbicort 160 Take 2 puffs first thing in am and then another 2 puffs about 12 hours later. spiriva in am only  Plan B = Backup- Proventil use it only if you can't your breath - ok to use up to every 4 hours if needed Plan C = Nebulizer up to every 4 hours if needed only if you try plan B first and it doesn't work   02/14/2014 f/u ov/Tony Sullivan re: copd GOLD II on ACEi and Advair and still smoking  Chief Complaint  Patient presents with  . Follow-up    Review pft.  Pt c/o prod cough with clear mucuos, SOB with exertion.  Interested in getting a portable 02 concentrator.    rec Plan A =  Automatic =  symbicort 160 Take 2 puffs first thing in am and then another 2 puffs about 12 hours later.                                       spiriva in am only  Plan B = Backup- Proventil use it only if you can't your breath - ok to use up to every 4 hours if needed Plan C = Nebulizer up to every 4 hours if needed only if you try plan B first and it doesn't work The key is to stop smoking completely before smoking completely stops you! 02  should be 3lpm with more than 100 ft walking  Please schedule a follow up visit in 3 months but call sooner if needed    06/15/2014 f/u ov/Tony Sullivan re: still smoking/ GOLD II copd 02 dep with ambulation / on spiriva/symbicort Chief Complaint  Patient presents with  . Follow-up    Pt reports that cough and SOB has been more increased recently d/t weather change. Using 2.5Liter O2    No need for saba yet in any form / dry hacking cough  rec Stop lisinopril Start valsartan 160 mg one daily  Please see patient coordinator before you leave today  to schedule ambulatory portable system that will deliver  02 at 2.5 lpm to replace your tank   08/03/2014 f/u ov/Tony Sullivan re: GOLD II COPD s/p exac to ER / still smoking on symbicort and spiriva  Chief Complaint  Patient presents with  . Follow-up    meds give dry mouth   to ER  07/09/14 with uri better but dry mouth  continues with throat irritation. Not limited by breathing from desired activities      No obvious day to day or daytime variabilty or assoc excess or purulent sputum or   cp or chest tightness, subjective wheeze overt sinus or hb symptoms. No unusual exp hx or h/o childhood pna/ asthma or knowledge of premature birth.  Sleeping ok without nocturnal  or early am exacerbation  of respiratory  c/o's or need for noct saba. Also denies any obvious fluctuation of symptoms with weather or environmental changes or other aggravating or alleviating factors except as outlined above   Current Medications, Allergies, Complete Past Medical History, Past Surgical History, Family History, and Social History were reviewed in [employer] record.  ROS  The following are not active complaints unless bolded sore throat, dysphagia, dental problems, itching, sneezing,  nasal congestion or excess/ purulent secretions, ear ache,   fever, chills, sweats, unintended wt loss, pleuritic or exertional cp, hemoptysis,  orthopnea pnd or leg swelling,  presyncope, palpitations, heartburn, abdominal pain, anorexia, nausea, vomiting, diarrhea  or change in bowel or urinary habits, change in stools or urine, dysuria,hematuria,  rash, arthralgias, visual complaints, headache, numbness weakness or ataxia or problems with walking or coordination,  change in mood/affect or memory.                       Objective:   Physical Exam   06/15/2014        164  > 08/03/2014  Wt Readings from Last 3 Encounters:  02/14/14 173 lb (78.472 kg)  01/10/14 168 lb 1.6 oz (76.25 kg)  01/07/14 173 lb (78.472 kg)      amb mod hoarse bm nad on 02     HEENT mild turbinate edema.  Oropharynx no thrush or excess pnd or cobblestoning.  No JVD or cervical adenopathy. Mild accessory muscle hypertrophy. Trachea midline, nl thryroid. Chest was hyperinflated by percussion with diminished breath sounds and moderate increased exp time without wheeze. Hoover sign positive at mid inspiration. Regular rate and rhythm without murmur gallop or rub or increase P2 or edema.  Abd: no hsm, nl excursion. Ext warm without cyanosis- mod bilateral clubbing     CXR  07/09/14 Emphysema. No acute superimposed abnormality identified.       Assessment & Plan:

## 2014-08-03 NOTE — Patient Instructions (Addendum)
Stop spiriva which should help your mouth and throat  Only use your albuterol (proair) as a rescue medication to be used if you can't catch your breath by resting or doing a relaxed purse lip breathing pattern.  - The less you use it, the better it will work when you need it. - Ok to use up to 2 puffs  every 4 hours if you must but call for immediate appointment if use goes up over your usual need - Don't leave home without it !!  (think of it like the spare tire for your car)   Only use your nebulized albuterol if you tried the proair and it doesn't work but call for appointment  if the need goes up  Over what it is now   The key is to stop smoking completely before smoking completely stops you!   See Tammy NP in 6  weeks with all your medications, even over the counter meds, separated in two separate bags, the ones you take no matter what vs the ones you stop once you feel better and take only as needed when you feel you need them.   Tammy  will generate for you a new user friendly medication calendar that will put [place] all on the same page re: your medication use.     Without this process, it simply isn't possible to assure that we are providing  your outpatient care  with  the attention to detail we feel you deserve.   If we cannot assure that you're getting that kind of care,  then we cannot manage your problem effectively from this clinic.  Once you have seen Tammy and we are sure that we're all on the same page with your medication use she will arrange follow up with me.

## 2014-08-03 NOTE — Assessment & Plan Note (Signed)
-   02/14/2014 p extensive coaching HFA effectiveness =    90% > try symbicort 160 2bid  - PFT's  02/14/2014   FEV1 1.82 (53%) and ratio 47 no better p alb (p spiriva and symbicort 160)  and DLCO 32 with 37%   Adherence is always the initial "prime suspect" and is a multilayered concern that requires a "trust but verify" approach in every patient - starting with knowing how to use medications, especially inhalers, correctly, keeping up with refills and understanding the fundamental difference between maintenance and prns vs those medications only taken for a very short course and then stopped and not refilled.  - not understanding action plan to try hfa first and back up with neb and avoid er, reviewed  ? Adverse effect of dpi > trial off spiriva   Active smoking    Each maintenance medication was reviewed in detail including most importantly the difference between maintenance and as needed and under what circumstances the prns are to be used.  Please see instructions for details which were reviewed in writing and the patient given a copy.

## 2014-08-03 NOTE — Assessment & Plan Note (Signed)
>   3 m  I took an extended  opportunity with this patient to outline the consequences of continued cigarette use  in airway disorders based on all the data we have from the multiple national lung health studies (perfomed over decades at millions of dollars in cost)  indicating that smoking cessation, not choice of inhalers or physicians, is the most important aspect of care.   

## 2014-08-03 NOTE — Assessment & Plan Note (Signed)
-   started on 27 December 2013 by [facility] clinic - 02/14/2014  Walked RA x 3/4  laps @ 185 ft each stopped due to  desat 85% and corrected on 3lpm to x 2 laps  - 06/15/2014  Walked 2.5lpm  2 laps  @ 185 ft each stopped due to  Sob adequate sats    As of 06/15/2014  rec  2.5 lpm  with exertion outside house or more than 50 ft / portable system for this   Adequate control on present rx, reviewed > no change in rx needed

## 2014-08-30 ENCOUNTER — Telehealth: Payer: Self-pay | Admitting: Internal Medicine

## 2014-08-30 MED ORDER — VALSARTAN 160 MG PO TABS: 160.0000 mg | ORAL_TABLET | Freq: Every day | ORAL | Status: DC

## 2014-08-30 MED ORDER — ALBUTEROL SULFATE HFA 108 (90 BASE) MCG/ACT IN AERS: 1.0000 | INHALATION_SPRAY | Freq: Four times a day (QID) | RESPIRATORY_TRACT | Status: DC | PRN

## 2014-08-30 MED ORDER — BUDESONIDE-FORMOTEROL FUMARATE 160-4.5 MCG/ACT IN AERO: 2.0000 | INHALATION_SPRAY | Freq: Two times a day (BID) | RESPIRATORY_TRACT | Status: DC

## 2014-08-30 NOTE — Telephone Encounter (Signed)
Called pt. He needed refills on his symbicort, albuterol inhaler and diovan. This has been sent in. Nothing further needed

## 2014-09-15 ENCOUNTER — Ambulatory Visit (INDEPENDENT_AMBULATORY_CARE_PROVIDER_SITE_OTHER): Payer: Medicaid Other | Admitting: Adult Health

## 2014-09-15 ENCOUNTER — Encounter: Payer: Self-pay | Admitting: Adult Health

## 2014-09-15 VITALS — BP 104/68 | HR 80 | Temp 97.0°F | Ht 71.5 in | Wt 158.4 lb

## 2014-09-15 DIAGNOSIS — J449 Chronic obstructive pulmonary disease, unspecified: Secondary | ICD-10-CM

## 2014-09-15 MED ORDER — OMEPRAZOLE 40 MG PO CPDR: 40.0000 mg | DELAYED_RELEASE_CAPSULE | Freq: Every day | ORAL | Status: DC

## 2014-09-15 MED ORDER — CETIRIZINE HCL 10 MG PO TABS: 10.0000 mg | ORAL_TABLET | Freq: Every day | ORAL | Status: DC

## 2014-09-15 MED ORDER — ALBUTEROL SULFATE HFA 108 (90 BASE) MCG/ACT IN AERS: 1.0000 | INHALATION_SPRAY | Freq: Four times a day (QID) | RESPIRATORY_TRACT | Status: DC | PRN

## 2014-09-15 NOTE — Progress Notes (Signed)
Subjective:    Patient ID: Tony Sullivan, male    DOB: 09/28/1961  MRN: [id number]    Brief patient profile:  89  yobm active smoker with breathing difficulties since 2004 dx as copd on advair 250 /50 but getting worse since 2014 and placed on 02 for the first time in March 2015 and referred 01/07/2014 to pulmonary clinic by Dr Kennon Holter with GOLD II copd criteria established 02/14/14    History of Present Illness  01/07/2014 1st [facility] office visit/ Wert  Chief Complaint  Patient presents with  . Advice Only    Referred for COPD, SOB.  Pt c/o SOB, low 02, prod cough with white mucous.  Pt forgot med list, unsure of what all he is taking  still can do a [employer] but uses [employer] parking, assoc with congested coughing esp in am worse in winter. rec Stop advair and The key is to stop smoking completely before smoking completely stops you!  Plan A =  Automatic =  symbicort 160 Take 2 puffs first thing in am and then another 2 puffs about 12 hours later. spiriva in am only  Plan B = Backup- Proventil use it only if you can't your breath - ok to use up to every 4 hours if needed Plan C = Nebulizer up to every 4 hours if needed only if you try plan B first and it doesn't work   02/14/2014 f/u ov/Wert re: copd GOLD II on ACEi and Advair and still smoking  Chief Complaint  Patient presents with  . Follow-up    Review pft.  Pt c/o prod cough with clear mucuos, SOB with exertion.  Interested in getting a portable 02 concentrator.    rec Plan A =  Automatic =  symbicort 160 Take 2 puffs first thing in am and then another 2 puffs about 12 hours later.                                       spiriva in am only  Plan B = Backup- Proventil use it only if you can't your breath - ok to use up to every 4 hours if needed Plan C = Nebulizer up to every 4 hours if needed only if you try plan B first and it doesn't work The key is to stop smoking completely before smoking completely stops you! 02  should be 3lpm with more than 100 ft walking  Please schedule a follow up visit in 3 months but call sooner if needed    06/15/2014 f/u ov/Wert re: still smoking/ GOLD II copd 02 dep with ambulation / on spiriva/symbicort Chief Complaint  Patient presents with  . Follow-up    Pt reports that cough and SOB has been more increased recently d/t weather change. Using 2.5Liter O2    No need for saba yet in any form / dry hacking cough  rec Stop lisinopril Start valsartan 160 mg one daily  Please see patient coordinator before you leave today  to schedule ambulatory portable system that will deliver  02 at 2.5 lpm to replace your tank   08/03/2014 f/u ov/Wert re: GOLD II COPD s/p exac to ER / still smoking on symbicort and spiriva  Chief Complaint  Patient presents with  . Follow-up    meds give dry mouth   to ER  07/09/14 with uri better but dry mouth  continues with throat irritation. Not limited by breathing from desired activities    >>  09/15/2014 Follow up and Med Calendar GOLD II COPD s/p exac to ER / still smoking on symbicort and spiriva  Patient returns for a follow-up and medication review. We reviewed all his medications organize them into a medication count with patient education It appears that he is taking his medications correctly but is out , has follow up with PCP for refills.  Has cut back on smoking .  Doing well w/ no flare of cough or wheezing/dyspnea.  Last visit spiriva was stopped, no flare off inhaler.  Needs refill on omeprazole , Zyrtec and proventil  Patient denies any chest pain, orthopnea, PND, or leg swelling    Current Medications, Allergies, Complete Past Medical History, Past Surgical History, Family History, and Social History were reviewed in [employer] record.  ROS  The following are not active complaints unless bolded sore throat, dysphagia, dental problems, itching, sneezing,  nasal congestion or excess/ purulent  secretions, ear ache,   fever, chills, sweats, unintended wt loss, pleuritic or exertional cp, hemoptysis,  orthopnea pnd or leg swelling, presyncope, palpitations, heartburn, abdominal pain, anorexia, nausea, vomiting, diarrhea  or change in bowel or urinary habits, change in stools or urine, dysuria,hematuria,  rash, arthralgias, visual complaints, headache, numbness weakness or ataxia or problems with walking or coordination,  change in mood/affect or memory.                       Objective:   Physical Exam   06/15/2014        164  > 08/03/2014 >09/15/2014 158     amb mod hoarse bm nad on 02     HEENT mild turbinate edema.  Oropharynx no thrush or excess pnd or cobblestoning.  No JVD or cervical adenopathy. Mild accessory muscle hypertrophy. Trachea midline, nl thryroid. Chest was hyperinflated by percussion with diminished breath sounds and moderate increased exp time without wheeze. Hoover sign positive at mid inspiration. Regular rate and rhythm without murmur gallop or rub or increase P2 or edema.  Abd: no hsm, nl excursion. Ext warm without cyanosis- mod bilateral clubbing     CXR  07/09/14 Emphysema. No acute superimposed abnormality identified.       Assessment & Plan:

## 2014-09-15 NOTE — Assessment & Plan Note (Signed)
Compensated without flare  .Patient's medications were reviewed today and patient education was given. Computerized medication calendar was adjusted/completed  Smoking cessation   Plan  Follow med calendar closely and bring to each visit. Continue current regimen. Continue to work on not smoking. Follow with Dr. [name] in 3 months and as needed.

## 2014-09-15 NOTE — Addendum Note (Signed)
Addended by: Parke Poisson E on: 09/15/2014 03:12 PM   Modules accepted: Orders

## 2014-09-15 NOTE — Patient Instructions (Signed)
Follow med calendar closely and bring to each visit. Continue current regimen. Continue to work on not smoking. Follow with Dr. [name] in 3 months and as needed.

## 2014-09-16 NOTE — Addendum Note (Signed)
Addended by: Inge Rise on: 09/16/2014 05:15 PM   Modules accepted: Orders, Medications

## 2014-11-05 ENCOUNTER — Emergency Department (HOSPITAL_COMMUNITY)
Admission: EM | Admit: 2014-11-05 | Discharge: 2014-11-05 | Disposition: A | Discharge status: Home / Self Care | Payer: Medicaid Other | Attending: Emergency Medicine | Admitting: Emergency Medicine

## 2014-11-05 ENCOUNTER — Encounter (HOSPITAL_COMMUNITY): Payer: Self-pay | Admitting: Emergency Medicine

## 2014-11-05 DIAGNOSIS — I10 Essential (primary) hypertension: Secondary | ICD-10-CM | POA: Insufficient documentation

## 2014-11-05 DIAGNOSIS — Z880 Allergy status to penicillin: Secondary | ICD-10-CM | POA: Insufficient documentation

## 2014-11-05 DIAGNOSIS — T59811A Toxic effect of smoke, accidental (unintentional), initial encounter: Secondary | ICD-10-CM

## 2014-11-05 DIAGNOSIS — Z79899 Other long term (current) drug therapy: Secondary | ICD-10-CM | POA: Diagnosis not present

## 2014-11-05 DIAGNOSIS — J441 Chronic obstructive pulmonary disease with (acute) exacerbation: Secondary | ICD-10-CM | POA: Diagnosis not present

## 2014-11-05 DIAGNOSIS — Z720 Tobacco use: Secondary | ICD-10-CM | POA: Diagnosis not present

## 2014-11-05 DIAGNOSIS — J705 Respiratory conditions due to smoke inhalation: Secondary | ICD-10-CM

## 2014-11-05 DIAGNOSIS — Z791 Long term (current) use of non-steroidal anti-inflammatories (NSAID): Secondary | ICD-10-CM | POA: Insufficient documentation

## 2014-11-05 DIAGNOSIS — R0602 Shortness of breath: Secondary | ICD-10-CM
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
## 2014-11-24 ENCOUNTER — Emergency Department (HOSPITAL_COMMUNITY): Payer: Medicaid Other

## 2014-11-24 ENCOUNTER — Emergency Department (HOSPITAL_COMMUNITY)
Admission: EM | Admit: 2014-11-24 | Discharge: 2014-11-25 | Disposition: A | Discharge status: Home / Self Care | Payer: Medicaid Other | Attending: Emergency Medicine | Admitting: Emergency Medicine

## 2014-11-24 ENCOUNTER — Encounter (HOSPITAL_COMMUNITY): Payer: Self-pay

## 2014-11-24 DIAGNOSIS — Z791 Long term (current) use of non-steroidal anti-inflammatories (NSAID): Secondary | ICD-10-CM | POA: Insufficient documentation

## 2014-11-24 DIAGNOSIS — Z79899 Other long term (current) drug therapy: Secondary | ICD-10-CM | POA: Insufficient documentation

## 2014-11-24 DIAGNOSIS — R079 Chest pain, unspecified: Secondary | ICD-10-CM | POA: Diagnosis present

## 2014-11-24 DIAGNOSIS — J441 Chronic obstructive pulmonary disease with (acute) exacerbation: Secondary | ICD-10-CM | POA: Insufficient documentation

## 2014-11-24 DIAGNOSIS — Z720 Tobacco use: Secondary | ICD-10-CM | POA: Diagnosis not present

## 2014-11-24 DIAGNOSIS — Z880 Allergy status to penicillin: Secondary | ICD-10-CM | POA: Insufficient documentation

## 2014-11-24 DIAGNOSIS — R0600 Dyspnea, unspecified: Secondary | ICD-10-CM

## 2014-11-24 LAB — CBC
HCT: 50.2 % (ref 39.0–52.0)
Hemoglobin: 17.6 g/dL — ABNORMAL HIGH (ref 13.0–17.0)
MCH: 33.3 pg (ref 26.0–34.0)
MCHC: 35.1 g/dL (ref 30.0–36.0)
MCV: 95.1 fL (ref 78.0–100.0)
Platelets: 243 10*3/uL (ref 150–400)
RBC: 5.28 MIL/uL (ref 4.22–5.81)
RDW: 14.1 % (ref 11.5–15.5)
WBC: 15 10*3/uL — ABNORMAL HIGH (ref 4.0–10.5)

## 2014-11-24 LAB — BASIC METABOLIC PANEL
Anion gap: 7 (ref 5–15)
BUN: 11 mg/dL (ref 6–23)
CO2: 25 mmol/L (ref 19–32)
Calcium: 9.3 mg/dL (ref 8.4–10.5)
Chloride: 107 mmol/L (ref 96–112)
Creatinine, Ser: 0.9 mg/dL (ref 0.50–1.35)
GFR calc Af Amer: >90 mL/min (ref >90)
GFR calc non Af Amer: >90 mL/min (ref >90)
Glucose, Bld: 106 mg/dL — ABNORMAL HIGH (ref 70–99)
Potassium: 3.6 mmol/L (ref 3.5–5.1)
Sodium: 139 mmol/L (ref 135–145)

## 2014-11-24 LAB — I-STAT TROPONIN, ED: Troponin i, poc: 0 ng/mL (ref 0.00–0.08)

## 2014-11-24 LAB — BRAIN NATRIURETIC PEPTIDE: B Natriuretic Peptide: 20 pg/mL (ref 0.0–100.0)

## 2014-11-24 IMAGING — CR DG CHEST 2V
2 series · 2 of 2 positions shown · non-contrast
Comparison: [DATE]

CLINICAL DATA: Chest and epigastric pain with shortness of breath
for 3 days.

EXAM:
CHEST  2 VIEW

[chest pa]
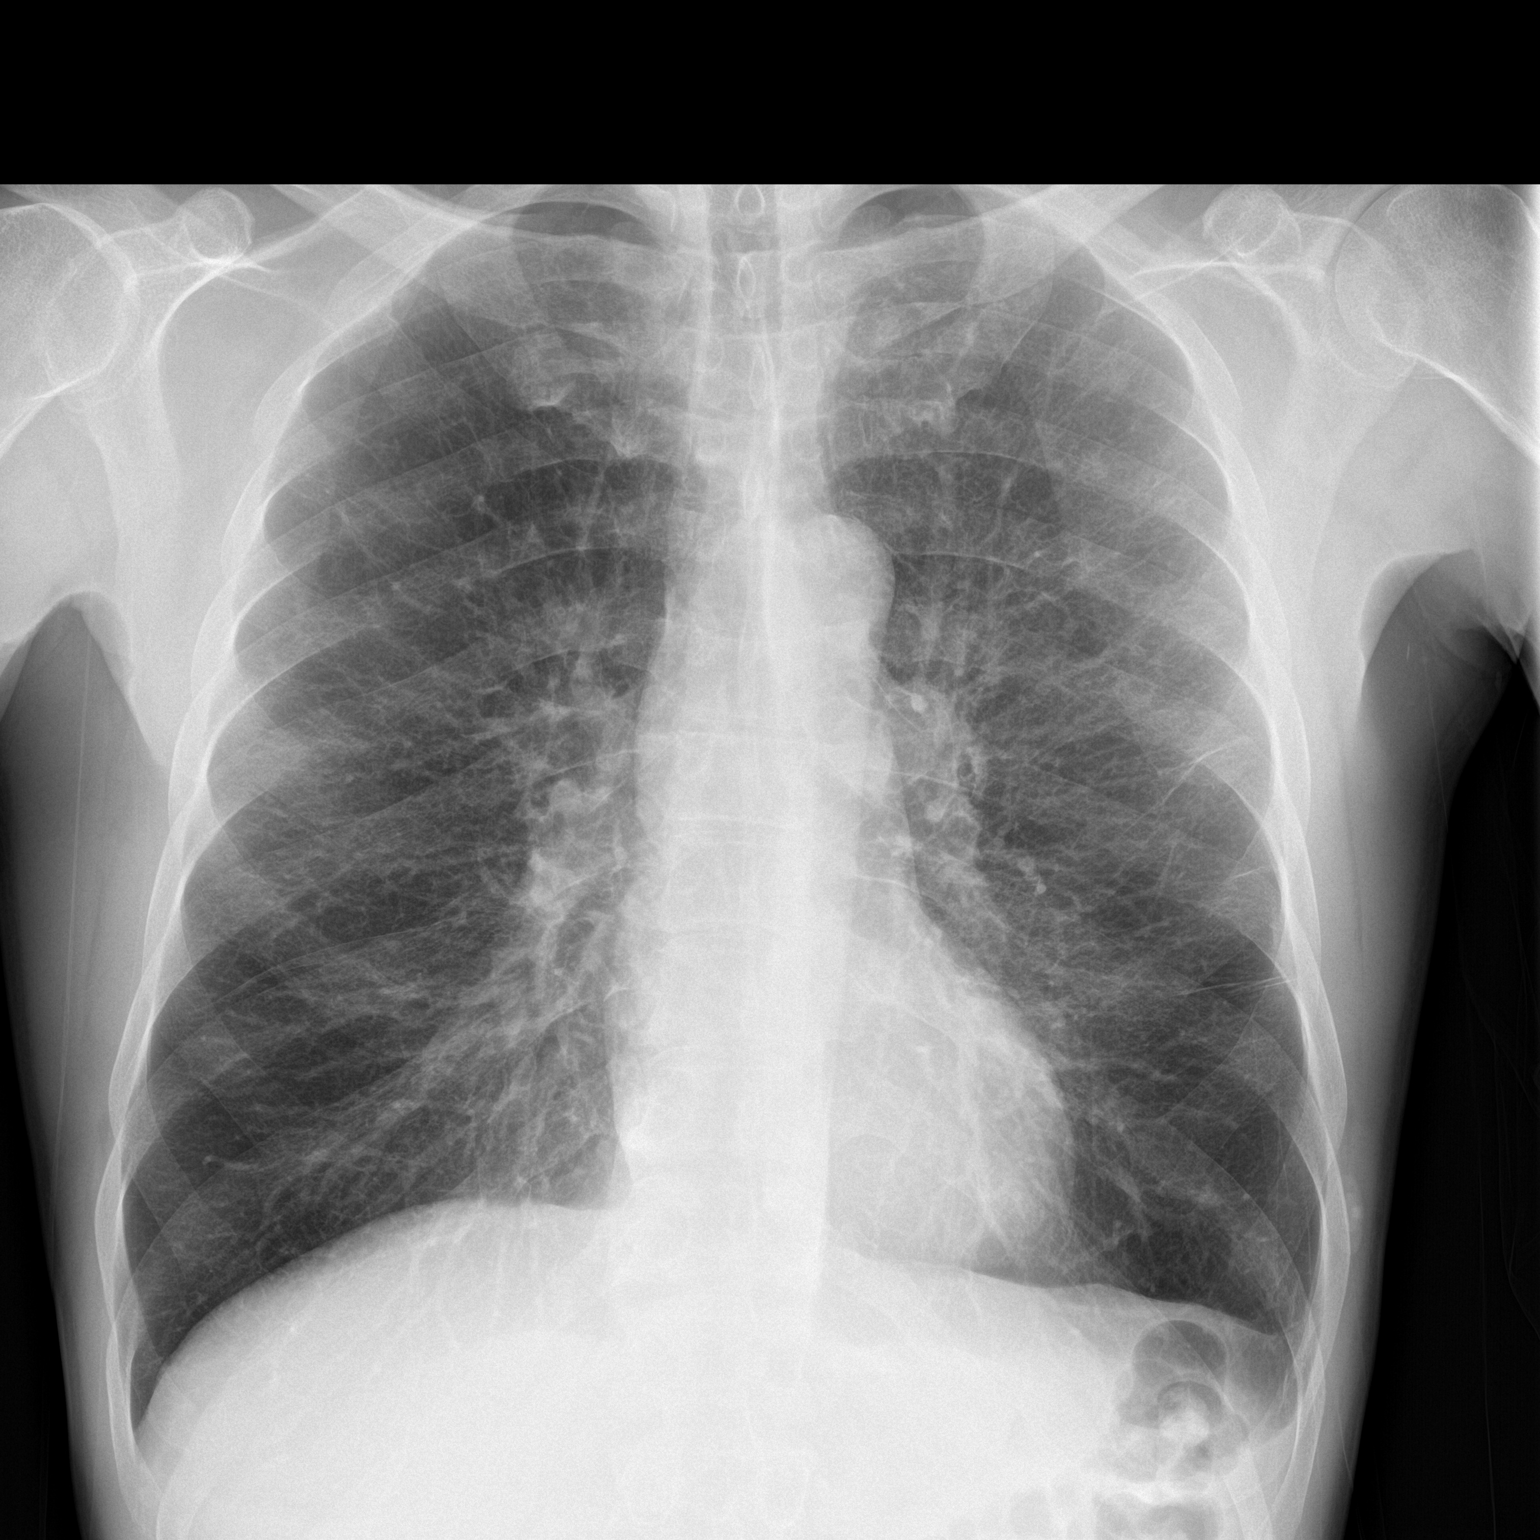

[chest lat]
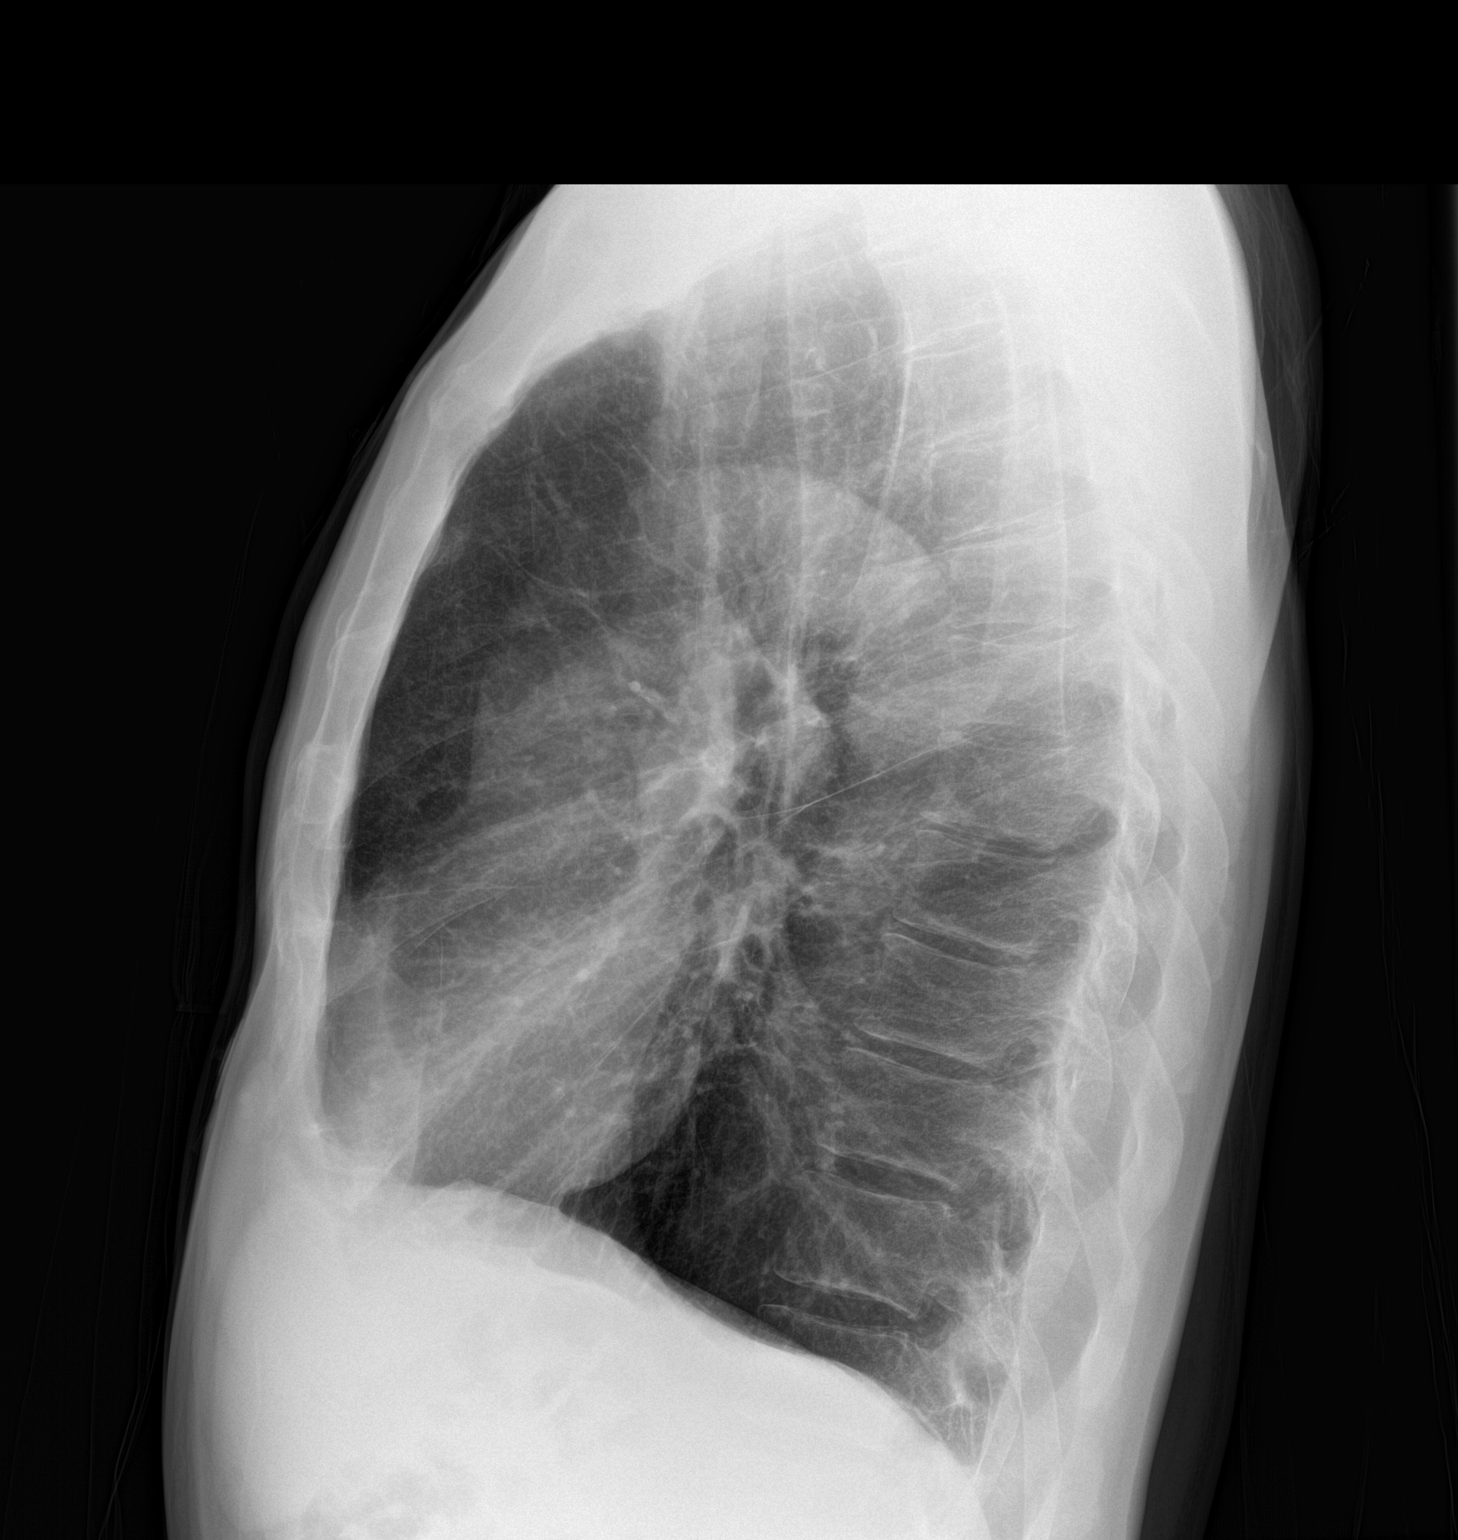

[2 of 2 positions shown; findings below may reference images not displayed]

FINDINGS: Hyperinflation suggesting emphysematous changes in the lungs.
Scattered fibrosis. Central interstitial pattern suggesting chronic
bronchitis. No focal airspace disease or consolidation in the lungs.
No blunting of costophrenic angles. No pneumothorax. Mediastinal
contours appear intact. Normal heart size and pulmonary vascularity.
IMPRESSION: Emphysematous and chronic bronchitic changes in the chest. No
evidence of active pulmonary disease.

## 2014-11-24 MED ORDER — IPRATROPIUM-ALBUTEROL 0.5-2.5 (3) MG/3ML IN SOLN
3.0000 mL | Freq: Once | RESPIRATORY_TRACT | Status: AC
  Administered 2014-11-24: 3 mL via RESPIRATORY_TRACT
  Filled 2014-11-24: qty 3

## 2014-11-24 MED ORDER — LORAZEPAM 1 MG PO TABS
1.0000 mg | ORAL_TABLET | Freq: Once | ORAL | Status: AC
  Administered 2014-11-24: 1 mg via ORAL
  Filled 2014-11-24: qty 1

## 2014-11-24 MED ORDER — KETOROLAC TROMETHAMINE 30 MG/ML IJ SOLN
30.0000 mg | Freq: Once | INTRAMUSCULAR | Status: AC
  Administered 2014-11-24: 30 mg via INTRAVENOUS
  Filled 2014-11-24: qty 1

## 2014-11-24 NOTE — ED Notes (Signed)
Pt reporting chest pain that started about mid day.  Pt also with shortness of breath that started this morning.  Pt with hx of emphysema.  Sts he has been having to wear more oxygen than normal at home; normally wears 2-3 L with exertion and has been having to use 4-6L.

## 2014-11-24 NOTE — ED Provider Notes (Signed)
CSN: [id number]     Arrival date & time 11/24/14  2140 History   First MD Initiated Contact with Patient 11/24/14 2231     Chief Complaint  Patient presents with  . Chest Pain  . Shortness of Breath     (Consider location/radiation/quality/duration/timing/severity/associated sxs/prior Treatment) Patient is a 54 y.o. male presenting with shortness of breath. The history is provided by the patient. No language interpreter was used.  Shortness of Breath Severity:  Moderate Onset quality:  Sudden Duration:  12 hours Timing:  Constant Progression:  Unchanged Chronicity:  New Relieved by:  Nothing Worsened by:  Nothing tried Ineffective treatments:  None tried Associated symptoms: chest pain   Associated symptoms: no abdominal pain, no cough, no fever, no headaches, no sore throat, no sputum production and no wheezing   Chest pain:    Chest pain quality: tightness.   Severity:  Moderate   Onset quality:  Gradual   Duration:  12 hours   Timing:  Constant   Progression:  Unchanged   Chronicity:  New Risk factors: no recent alcohol use, no family hx of DVT, no hx of cancer, no hx of PE/DVT and no obesity     Past Medical History  Diagnosis Date  . High blood pressure   . COPD (chronic obstructive pulmonary disease) 2004    diagnosed in 2004, no PFT's to date.  Started on home O2 12/2013, after found to be desatting at PCP's office, and referred to pulmonology.  . Seasonal allergies    Past Surgical History  Procedure Laterality Date  . Appendectomy  1980  . Hip surgery Right   . Shoulder arthroscopy Right    Family History  Problem Relation Age of Onset  . Emphysema Mother   . Emphysema Father   . Allergies Mother     "everyone in family"  . Allergies Father   . Allergies Son   . Asthma Mother     "everyone in family"  . Heart disease Mother   . Heart disease Maternal Aunt   . Clotting disorder Mother   . Cancer Mother    History  Substance Use Topics  . Smoking  status: Current Every Day Smoker -- 0.10 packs/day for 36 years    Types: Cigarettes  . Smokeless tobacco: Never Used     Comment: Was up to 2 ppd, down to 5 cigs/day.  . Alcohol Use: 8.4 - 12.6 oz/week    14-21 Not specified per week     Comment: 2-3 beers/day, with occasional "binges".  No history of withdrawal    Review of Systems  Constitutional: Negative for fever and chills.  HENT: Negative for sore throat.   Respiratory: Positive for shortness of breath. Negative for cough, sputum production and wheezing.   Cardiovascular: Positive for chest pain.  Gastrointestinal: Negative for abdominal pain.  Genitourinary: Negative for dysuria, urgency and frequency.  Neurological: Negative for headaches.  All other systems reviewed and are negative.     Allergies  Penicillins and Sulfa antibiotics  Home Medications   Prior to Admission medications   Medication Sig Start Date End Date Taking? Authorizing Provider  albuterol (PROVENTIL) (2.5 MG/3ML) 0.083% nebulizer solution Take 2.5 mg by nebulization every 4 (four) hours as needed for wheezing or shortness of breath.    Yes Historical Provider, MD  amLODipine (NORVASC) 10 MG tablet Take 10 mg by mouth every morning.    Yes Historical Provider, MD  benztropine (COGENTIN) 0.5 MG tablet 1 tablet at bedtime  Yes Historical Provider, MD  budesonide-formoterol (SYMBICORT) 160-4.5 MCG/ACT inhaler Inhale 2 puffs into the lungs every 12 (twelve) hours. Take 2 puffs first thing in am and then another 2 puffs about 12 hours later. Patient taking differently: Inhale 2 puffs into the lungs every 12 (twelve) hours. Take 2 puffs first thing in am and then another 2 puffs about 12 hours later at bedtime. 08/30/14  Yes [name], MD  cetirizine (ZYRTEC) 10 MG tablet Take 1 tablet (10 mg total) by mouth daily. Patient taking differently: Take 10 mg by mouth at bedtime.  09/15/14  Yes [name], MD  loxapine (LOXITANE) 10 MG capsule Take 10 mg  by mouth at bedtime.    Yes Historical Provider, MD  meloxicam (MOBIC) 7.5 MG tablet Take 1 tablet by mouth every morning   Yes Historical Provider, MD  mirtazapine (REMERON) 30 MG tablet Take 30 mg by mouth at bedtime.   Yes Historical Provider, MD  risperiDONE (RISPERDAL) 2 MG tablet Take 2 mg by mouth at bedtime.   Yes Historical Provider, MD  albuterol (PROVENTIL HFA;VENTOLIN HFA) 108 (90 BASE) MCG/ACT inhaler Inhale 1-2 puffs into the lungs every 6 (six) hours as needed for wheezing or shortness of breath. Patient not taking: Reported on 11/24/2014 09/15/14   [name], MD  NON FORMULARY Oxygen 2 liters at bedtime 3-4 liters with walking    Historical Provider, MD  omeprazole (PRILOSEC) 40 MG capsule Take 1 capsule (40 mg total) by mouth daily. Patient not taking: Reported on 11/24/2014 09/15/14   [name], MD  valsartan (DIOVAN) 160 MG tablet Take 1 tablet (160 mg total) by mouth daily. Patient not taking: Reported on 11/24/2014 08/30/14   [name], MD   BP 127/85 mmHg  Pulse 86  Temp(Src) 97.9 F (36.6 C) (Oral)  Resp 21  SpO2 95% Physical Exam  Constitutional: He is oriented to person, place, and time. He appears well-developed and well-nourished. No distress.  HENT:  Head: Normocephalic and atraumatic.  Eyes: Pupils are equal, round, and reactive to light.  Neck: Normal range of motion.  Cardiovascular: Normal rate, regular rhythm and normal heart sounds.   Pulmonary/Chest: Effort normal and breath sounds normal. No respiratory distress. He has no decreased breath sounds. He has no wheezes. He has no rhonchi. He has no rales.  Abdominal: Soft. He exhibits no distension. There is no tenderness. There is no rebound and no guarding.  Musculoskeletal: He exhibits no edema or tenderness.       Right lower leg: He exhibits no swelling and no edema.       Left lower leg: He exhibits no swelling and no edema.  Neurological: He is alert and oriented to person, place, and  time. He exhibits normal muscle tone.  Skin: Skin is warm and dry.  Nursing note and vitals reviewed.   ED Course  Procedures (including critical care time) Labs Review Labs Reviewed  CBC - Abnormal; Notable for the following:    WBC 15.0 (*)    Hemoglobin 17.6 (*)    All other components within normal limits  BASIC METABOLIC PANEL - Abnormal; Notable for the following:    Glucose, Bld 106 (*)    All other components within normal limits  BRAIN NATRIURETIC PEPTIDE  D-DIMER, QUANTITATIVE  I-STAT TROPOININ, ED    Imaging Review Dg Chest 2 View  11/24/2014   CLINICAL DATA:  Chest and epigastric pain with shortness of breath for 3 days.  EXAM: CHEST  2 VIEW  COMPARISON:  07/09/2014  FINDINGS: Hyperinflation suggesting emphysematous changes in the lungs. Scattered fibrosis. Central interstitial pattern suggesting chronic bronchitis. No focal airspace disease or consolidation in the lungs. No blunting of costophrenic angles. No pneumothorax. Mediastinal contours appear intact. Normal heart size and pulmonary vascularity.  IMPRESSION: Emphysematous and chronic bronchitic changes in the chest. No evidence of active pulmonary disease.   Electronically Signed   By: Lucienne Capers M.D.   On: 11/24/2014 22:32     EKG Interpretation   Date/Time:  Thursday November 24 2014 21:45:55 EST Ventricular Rate:  91 PR Interval:  158 QRS Duration: 80 QT Interval:  358 QTC Calculation: 440 R Axis:   -90 Text Interpretation:  Normal sinus rhythm Biatrial enlargement Right  superior axis deviation RSR' or QR pattern in V1 suggests right  ventricular conduction delay Anteroseptal infarct , age undetermined  Abnormal ECG No significant change since last tracing Confirmed by  GOLDSTON  MD, SCOTT ([id number]) on 11/24/2014 10:32:17 PM      MDM   Final diagnoses:  Dyspnea   Patient is a 54 year old African-American male with pertinent past medical history of COPD who comes to the emergency department  today with shortness of breath and chest pain which began 12 PM this afternoon. Patient has not had any change in his symptoms since then. He was so short of breath that he used his home oxygen (which he rarely uses) and turned it up to 6 L before he felt improvement. Patient did attempt using a DuoNeb with no real change in his symptoms. As a result he came to the ED tonight. Physical exam as above. Patient does not have any wheezing on exam as result I doubt a COPD exacerbation. He was removed from his oxygen and placed back on room air with no desaturations after several minutes as result I feel this further supports he is not having a significant COPD exacerbation. Initial workup included an i-STAT troponin, CBC, BMP, BNP, EKG, chest x-ray, and a d-dimer. Patient's history is atypical for PE I have low suspicion as result d-dimer should be sufficient to rule this out. Patient has had continuous pain for 12 hours as result a single troponin should be sufficient to rule out MI. D-dimer was negative as resulted doubt PE. Troponin was negative as a result doubt MI. CBC was unremarkable. BMP was unremarkable. BNP was 20 as result I doubt a CHF exacerbation. Chest x-ray demonstrated no consolidations making pneumonia unlikely particularly with no fevers or cough. Patient was treated with Ativan and Toradol with complete resolution of his symptoms. As result I feel the patient is stable for discharge at this time. He was instructed to follow-up with his primary care physician within a week to ensure that he continues to improve. He was instructed to return to the emergency department with worsening shortness of breath, fevers, chills, exertional shortness of breath or chest pain, or any other concerns. The patient expressed understanding. He was discharged in a good condition. Labs and imaging were reviewed by myself and considered in medical decision making. Imaging was interpreted by radiology. Care was discussed  with my attending Dr. Regenia Skeeter.      Katheren Shams, MD 11/25/14 [place], MD 12/03/14 [phone]

## 2014-11-25 LAB — D-DIMER, QUANTITATIVE: D-Dimer, Quant: <0.27 ug/mL-FEU (ref 0.00–0.48)

## 2014-11-25 NOTE — Discharge Instructions (Signed)

## 2014-12-27 ENCOUNTER — Other Ambulatory Visit: Payer: Self-pay | Admitting: Internal Medicine

## 2014-12-28 ENCOUNTER — Ambulatory Visit: Payer: Medicaid Other | Admitting: Internal Medicine

## 2015-01-20 ENCOUNTER — Other Ambulatory Visit: Payer: Self-pay | Admitting: Internal Medicine

## 2015-02-20 ENCOUNTER — Encounter: Payer: Self-pay | Admitting: Internal Medicine

## 2015-02-20 ENCOUNTER — Ambulatory Visit (INDEPENDENT_AMBULATORY_CARE_PROVIDER_SITE_OTHER): Payer: Medicaid Other | Admitting: Internal Medicine

## 2015-02-20 VITALS — BP 114/64 | HR 87 | Ht 71.0 in | Wt 155.0 lb

## 2015-02-20 DIAGNOSIS — Z720 Tobacco use: Secondary | ICD-10-CM | POA: Diagnosis not present

## 2015-02-20 DIAGNOSIS — J449 Chronic obstructive pulmonary disease, unspecified: Secondary | ICD-10-CM

## 2015-02-20 DIAGNOSIS — R0902 Hypoxemia: Secondary | ICD-10-CM

## 2015-02-20 DIAGNOSIS — F17200 Nicotine dependence, unspecified, uncomplicated: Secondary | ICD-10-CM

## 2015-02-20 NOTE — Patient Instructions (Addendum)
Always blow symbicort out thru nose but technique is very good   Plan A =  Automatic =  symbicort 160 Take 2 puffs first thing in am and then another 2 puffs about 12 hours later.                                          Plan B = Backup- Proventil use it only if you can't your breath - ok to use up to every 4 hours if needed  Plan C = Nebulizer up to every 4 hours if needed only if you try plan B first and it doesn't work  The key is to stop smoking completely before smoking completely stops you!  No need for 02 at bedtime or with nl activities, use 2lpm with heavy exertion as needed   Please schedule a follow up office visit in 4 weeks, call sooner if needed to see Tammy with all your meds for recheck  ? Refer to rehab next vs add lama = tudorza or incruse but not  spiriva as  dried him out

## 2015-02-20 NOTE — Progress Notes (Signed)
Subjective:    Patient ID: Tony Sullivan, male    DOB: [birth year]  MRN: [id number]    Brief patient profile:  18  yobm active smoker with breathing difficulties since 2004 dx as copd on advair 250 /50 but getting worse since 2014 and placed on 02 for the first time in March 2015 and referred 01/07/2014 to pulmonary clinic by Dr Kennon Holter with GOLD II copd criteria established 02/14/14    History of Present Illness  01/07/2014 1st [facility] office visit/ Tony Sullivan  Chief Complaint  Patient presents with  . Advice Only    Referred for COPD, SOB.  Pt c/o SOB, low 02, prod cough with white mucous.  Pt forgot med list, unsure of what all he is taking  still can do a [employer] but uses [facility], assoc with congested coughing esp in am worse in winter. rec Stop advair   Plan A =  Automatic =  symbicort 160 Take 2 puffs first thing in am and then another 2 puffs about 12 hours later. spiriva in am only  Plan B = Backup- Proventil use it only if you can't your breath - ok to use up to every 4 hours if needed Plan C = Nebulizer up to every 4 hours if needed only if you try plan B first and it doesn't work   02/14/2014 f/u ov/Tony Sullivan re: copd GOLD II on ACEi and Advair and still smoking  Chief Complaint  Patient presents with  . Follow-up    Review pft.  Pt c/o prod cough with clear mucuos, SOB with exertion.  Interested in getting a portable 02 concentrator.    rec Plan A =  Automatic =  symbicort 160 Take 2 puffs first thing in am and then another 2 puffs about 12 hours later.                                       spiriva in am only  Plan B = Backup- Proventil use it only if you can't your breath - ok to use up to every 4 hours if needed Plan C = Nebulizer up to every 4 hours if needed only if you try plan B first and it doesn't work The key is to stop smoking completely before smoking completely stops you! 02 should be 3lpm with more than 100 ft walking  Please schedule a follow up visit  in 3 months but call sooner if needed    06/15/2014 f/u ov/Tony Sullivan re: still smoking/ GOLD II copd 02 dep with ambulation / on spiriva/symbicort Chief Complaint  Patient presents with  . Follow-up    Pt reports that cough and SOB has been more increased recently d/t weather change. Using 2.5Liter O2  No need for saba yet in any form / dry hacking cough  rec Stop lisinopril Start valsartan 160 mg one daily  Please see patient coordinator before you leave today  to schedule ambulatory portable system that will deliver  02 at 2.5 lpm to replace your tank     09/15/2014 Follow up and Med Calendar GOLD II COPD s/p exac to ER / still smoking on symbicort and spiriva  Patient returns for a follow-up and medication review. We reviewed all his medications organize them into a medication count with patient education It appears that he is taking his medications correctly but is out , has follow up  with PCP for refills.  Has cut back on smoking .  Doing well w/ no flare of cough or wheezing/dyspnea.  Last visit spiriva was stopped, no flare off inhaler.  Needs refill on omeprazole , Zyrtec and proventil  rec Follow med calendar closely and bring to each visit. Continue current regimen. Continue to work on not smoking.    02/20/2015 f/u ov/Tony Sullivan re: GOLD II / no med calendar/ no meds / still smoking  Chief Complaint  Patient presents with  . Follow-up    Pt c/o of SOB with excertion, wheezing, dry cough. Pt stopped spirvia, and uses nebs and albuterol prn.  no real change doe/ MMRC Grad 1/2 minimal variability  better on 3lpm when he uses it Easily confused with details of care and what he is vs not taking vs has run out - using multiple doses of saba in hfa and neb form more day than noct / saba helps some   No obvious patterns in day to day or daytime variabilty or assoc excess/ purulent sputum  or cp or chest tightness,  overt sinus or hb symptoms. No unusual exp hx or h/o childhood pna/  asthma or knowledge of premature birth.  Sleeping ok without nocturnal  or early am exacerbation  of respiratory  c/o's or need for noct saba. Also denies any obvious fluctuation of symptoms with weather or environmental changes or other aggravating or alleviating factors except as outlined above   Current Medications, Allergies, Complete Past Medical History, Past Surgical History, Family History, and Social History were reviewed in [employer] record.  ROS  The following are not active complaints unless bolded sore throat, dysphagia, dental problems, itching, sneezing,  nasal congestion or excess/ purulent secretions, ear ache,   fever, chills, sweats, unintended wt loss, pleuritic or exertional cp, hemoptysis,  orthopnea pnd or leg swelling, presyncope, palpitations, heartburn, abdominal pain, anorexia, nausea, vomiting, diarrhea  or change in bowel or urinary habits, change in stools or urine, dysuria,hematuria,  rash, arthralgias, visual complaints, headache, numbness weakness or ataxia or problems with walking or coordination,  change in mood/affect or memory.                           Objective:   Physical Exam   06/15/2014  164  >  09/15/2014 158 >   02/20/2015      amb mod hoarse bm nad on 02     HEENT mild turbinate edema.  Oropharynx no thrush or excess pnd or cobblestoning.  No JVD or cervical adenopathy. Mild accessory muscle hypertrophy. Trachea midline, nl thryroid. Chest was hyperinflated by percussion with diminished breath sounds and moderate increased exp time without wheeze. Hoover sign positive at mid inspiration. Regular rate and rhythm without murmur gallop or rub or increase P2 or edema.  Abd: no hsm, nl excursion. Ext warm without cyanosis- mod bilateral clubbing      I personally reviewed images and agree with radiology impression as follows:  CXR:  11/24/14 Emphysematous and chronic bronchitic changes in the chest. No evidence of  active pulmonary disease.         Assessment & Plan:

## 2015-02-21 ENCOUNTER — Other Ambulatory Visit: Payer: Self-pay | Admitting: Internal Medicine

## 2015-02-22 ENCOUNTER — Encounter: Payer: Self-pay | Admitting: Internal Medicine

## 2015-02-22 NOTE — Assessment & Plan Note (Signed)

## 2015-02-22 NOTE — Assessment & Plan Note (Signed)
-   02/14/2014   try symbicort 160 2bid  - PFT's  02/14/2014   FEV1 1.82 (53%) and ratio 47 no better p alb (p spiriva and symbicort 160)  and DLCO 32 with 37%  07/2014 >Spiriva stopped  09/15/2014 Med calendar> not using 02/20/2015    The proper method of use, as well as anticipated side effects, of a metered-dose inhaler are discussed and demonstrated to the patient. Improved effectiveness after extensive coaching during this visit to a level of approximately  90%    I had an extended discussion with the patient reviewing all relevant studies completed to date and  lasting 15 to 20 minutes of a 25 minute visit on the following ongoing concerns:   1) I told him flat out that he needs to take ownership of this problem and meet [place] half way if he want our help.  When I used the expression "no one washes a are they don't own"  He wanted to argue about it and I'm not sure I got thru to him but I trid  2) Each maintenance medication was reviewed in detail including most importantly the difference between maintenance and as needed and under what circumstances the prns are to be used.  Please see instructions for details which were reviewed in writing and the patient given a copy.    3) struggling with concept of med reconciliation / ? Medically illiterate?   To keep things simple, I have asked the patient to first separate medicines that are perceived as maintenance, that is to be taken daily "no matter what", from those medicines that are taken on only on an as-needed basis and I have given the patient examples of both, and then return to see our NP to generate a  detailed  medication calendar which should be followed until the next physician sees the patient and updates it.

## 2015-02-22 NOTE — Assessment & Plan Note (Signed)
-   started on 27 December 2013 by [facility] clinic - 02/14/2014  Walked RA x 3/4  laps @ 185 ft each stopped due to  desat 85% and corrected on 3lpm to x 2 laps  - 06/15/2014  Walked 2.5lpm  2 laps  @ 185 ft each stopped due to  Sob adequate sats  - 02/20/2015  Walked RA x 3 laps @ 185 ft each stopped due to  Sob/desat p 2 laps, nl pace, eliminated on his 02 at 3lpm    As of 02/20/2015  rec  3 lpm  with exertion outside house but no need for 02 room to room

## 2015-02-27 ENCOUNTER — Other Ambulatory Visit: Payer: Self-pay | Admitting: Internal Medicine

## 2015-03-20 ENCOUNTER — Ambulatory Visit: Payer: Medicaid Other | Admitting: Adult Health

## 2015-03-22 ENCOUNTER — Encounter: Payer: Self-pay | Admitting: Adult Health

## 2015-03-22 ENCOUNTER — Ambulatory Visit (INDEPENDENT_AMBULATORY_CARE_PROVIDER_SITE_OTHER): Payer: Medicaid Other | Admitting: Adult Health

## 2015-03-22 VITALS — BP 122/78 | HR 81 | Temp 97.8°F | Ht 71.0 in | Wt 156.0 lb

## 2015-03-22 DIAGNOSIS — J449 Chronic obstructive pulmonary disease, unspecified: Secondary | ICD-10-CM | POA: Diagnosis not present

## 2015-03-22 DIAGNOSIS — R0902 Hypoxemia: Secondary | ICD-10-CM

## 2015-03-22 NOTE — Patient Instructions (Addendum)
Continue on Symbicort 2 puffs Twice daily  , rinse after use.  Begin Tudorza 1 puff Twice daily  , rinse after use.  Work on not smoking.  Follow up Dr. [name]  In 2 months and As needed   Please contact office for sooner follow up if symptoms do not improve or worsen or seek emergency care

## 2015-03-22 NOTE — Assessment & Plan Note (Signed)
Symptomatic COPD in smoker  Will add Tudorza  Previously failed spiriva due to dryness   Plan  Continue on Symbicort 2 puffs Twice daily  , rinse after use.  Begin Tudorza 1 puff Twice daily  , rinse after use.  Work on not smoking.  Follow up Dr. [name]  In 2 months and As needed   Please contact office for sooner follow up if symptoms do not improve or worsen or seek emergency care

## 2015-03-22 NOTE — Progress Notes (Signed)
Chart and progress notes reviewed and agree with a/p

## 2015-03-22 NOTE — Progress Notes (Signed)
Subjective:    Patient ID: Tony Sullivan, male    DOB: 21-Jun-1961  MRN: [id number]    Brief patient profile:  89  yobm active smoker with breathing difficulties since 2004 dx as copd on advair 250 /50 but getting worse since 2014 and placed on 02 for the first time in March 2015 and referred 01/07/2014 to pulmonary clinic by Dr Kennon Holter with GOLD II copd criteria established 02/14/14    History of Present Illness  01/07/2014 1st [facility] office visit/ Wert  Chief Complaint  Patient presents with  . Advice Only    Referred for COPD, SOB.  Pt c/o SOB, low 02, prod cough with white mucous.  Pt forgot med list, unsure of what all he is taking  still can do a [employer] but uses [employer] [facility], assoc with congested coughing esp in am worse in winter. rec Stop advair   Plan A =  Automatic =  symbicort 160 Take 2 puffs first thing in am and then another 2 puffs about 12 hours later. spiriva in am only  Plan B = Backup- Proventil use it only if you can't your breath - ok to use up to every 4 hours if needed Plan C = Nebulizer up to every 4 hours if needed only if you try plan B first and it doesn't work   02/14/2014 f/u ov/Wert re: copd GOLD II on ACEi and Advair and still smoking  Chief Complaint  Patient presents with  . Follow-up    Review pft.  Pt c/o prod cough with clear mucuos, SOB with exertion.  Interested in getting a portable 02 concentrator.    rec Plan A =  Automatic =  symbicort 160 Take 2 puffs first thing in am and then another 2 puffs about 12 hours later.                                       spiriva in am only  Plan B = Backup- Proventil use it only if you can't your breath - ok to use up to every 4 hours if needed Plan C = Nebulizer up to every 4 hours if needed only if you try plan B first and it doesn't work The key is to stop smoking completely before smoking completely stops you! 02 should be 3lpm with more than 100 ft walking  Please schedule a follow up visit  in 3 months but call sooner if needed    06/15/2014 f/u ov/Wert re: still smoking/ GOLD II copd 02 dep with ambulation / on spiriva/symbicort Chief Complaint  Patient presents with  . Follow-up    Pt reports that cough and SOB has been more increased recently d/t weather change. Using 2.5Liter O2  No need for saba yet in any form / dry hacking cough  rec Stop lisinopril Start valsartan 160 mg one daily  Please see patient coordinator before you leave today  to schedule ambulatory portable system that will deliver  02 at 2.5 lpm to replace your tank     09/15/2014 Follow up and Med Calendar GOLD II COPD s/p exac to ER / still smoking on symbicort and spiriva  Patient returns for a follow-up and medication review. We reviewed all his medications organize them into a medication count with patient education It appears that he is taking his medications correctly but is out , has follow up  with PCP for refills.  Has cut back on smoking .  Doing well w/ no flare of cough or wheezing/dyspnea.  Last visit spiriva was stopped, no flare off inhaler.  Needs refill on omeprazole , Zyrtec and proventil  rec Follow med calendar closely and bring to each visit. Continue current regimen. Continue to work on not smoking.    02/20/2015 f/u ov/Wert re: GOLD II / no med calendar/ no meds / still smoking  Chief Complaint  Patient presents with  . Follow-up    Pt c/o of SOB with excertion, wheezing, dry cough. Pt stopped spirvia, and uses nebs and albuterol prn.  no real change doe/ MMRC Grad 1/2 minimal variability  better on 3lpm when he uses it Easily confused with details of care and what he is vs not taking vs has run out - using multiple doses of saba in hfa and neb form more day than noct / saba helps some  >change o2 As needed  With act   03/22/2015 Follow up : COPD /GOLD II  Pt returns for 1 month follow up .  Says he still gets winded easily.  Has cough on/off. No hemoptysis , chest pain  or orthopnea.  Remains on Symbicort Twice daily  .  Remains on  O2 with activity. Does still wear at bedtime .  Uses rescue inhaler most days.  Still smoking . Discussed cessation.    Current Medications, Allergies, Complete Past Medical History, Past Surgical History, Family History, and Social History were reviewed in [employer] record.  ROS  The following are not active complaints unless bolded sore throat, dysphagia, dental problems, itching, sneezing,  nasal congestion or excess/ purulent secretions, ear ache,   fever, chills, sweats, unintended wt loss, pleuritic or exertional cp, hemoptysis,  orthopnea pnd or leg swelling, presyncope, palpitations, heartburn, abdominal pain, anorexia, nausea, vomiting, diarrhea  or change in bowel or urinary habits, change in stools or urine, dysuria,hematuria,  rash, arthralgias, visual complaints, headache, numbness weakness or ataxia or problems with walking or coordination,  change in mood/affect or memory.          Objective:   Physical Exam   06/15/2014  164  >  09/15/2014 158 >   02/20/2015 >156 03/22/2015      amb mod hoarse bm nad on 02     HEENT mild turbinate edema.  Oropharynx no thrush or excess pnd or cobblestoning.  No JVD or cervical adenopathy. Mild accessory muscle hypertrophy. Trachea midline, nl thryroid. Chest was hyperinflated by percussion with diminished breath sounds and moderate increased exp time without wheeze. Hoover sign positive at mid inspiration. Regular rate and rhythm without murmur gallop or rub or increase P2 or edema.  Abd: no hsm, nl excursion. Ext warm without cyanosis- mod bilateral clubbing   I personally reviewed images and agree with radiology impression as follows:  CXR:  11/24/14 Emphysematous and chronic bronchitic changes in the chest. No evidence of active pulmonary disease.         Assessment & Plan:

## 2015-03-22 NOTE — Assessment & Plan Note (Signed)
Cont w/ O2 with activity.

## 2015-03-24 ENCOUNTER — Other Ambulatory Visit: Payer: Self-pay | Admitting: Internal Medicine

## 2015-04-05 ENCOUNTER — Telehealth: Payer: Self-pay | Admitting: Adult Health

## 2015-04-05 MED ORDER — ACLIDINIUM BROMIDE 400 MCG/ACT IN AEPB: 1.0000 | INHALATION_SPRAY | Freq: Two times a day (BID) | RESPIRATORY_TRACT | Status: DC

## 2015-04-05 MED ORDER — BUDESONIDE-FORMOTEROL FUMARATE 160-4.5 MCG/ACT IN AERO: 2.0000 | INHALATION_SPRAY | Freq: Two times a day (BID) | RESPIRATORY_TRACT | Status: DC

## 2015-04-05 NOTE — Telephone Encounter (Signed)
Pt is referring to [place]. Rx for this and Symbicort will be sent in per the pt's request. Nothing further was needed.

## 2015-05-22 ENCOUNTER — Ambulatory Visit: Payer: Medicaid Other | Admitting: Internal Medicine

## 2015-05-23 ENCOUNTER — Other Ambulatory Visit: Payer: Self-pay | Admitting: Internal Medicine

## 2015-06-12 ENCOUNTER — Emergency Department (HOSPITAL_COMMUNITY)
Admission: EM | Admit: 2015-06-12 | Discharge: 2015-06-12 | Disposition: A | Discharge status: Home / Self Care | Payer: Medicaid Other | Attending: Emergency Medicine | Admitting: Emergency Medicine

## 2015-06-12 ENCOUNTER — Encounter (HOSPITAL_COMMUNITY): Payer: Self-pay

## 2015-06-12 ENCOUNTER — Emergency Department (HOSPITAL_COMMUNITY): Payer: Medicaid Other

## 2015-06-12 DIAGNOSIS — K529 Noninfective gastroenteritis and colitis, unspecified: Secondary | ICD-10-CM | POA: Insufficient documentation

## 2015-06-12 DIAGNOSIS — Z880 Allergy status to penicillin: Secondary | ICD-10-CM | POA: Insufficient documentation

## 2015-06-12 DIAGNOSIS — R1084 Generalized abdominal pain: Secondary | ICD-10-CM

## 2015-06-12 DIAGNOSIS — Z7951 Long term (current) use of inhaled steroids: Secondary | ICD-10-CM | POA: Insufficient documentation

## 2015-06-12 DIAGNOSIS — R109 Unspecified abdominal pain: Secondary | ICD-10-CM | POA: Diagnosis present

## 2015-06-12 DIAGNOSIS — J449 Chronic obstructive pulmonary disease, unspecified: Secondary | ICD-10-CM | POA: Diagnosis not present

## 2015-06-12 DIAGNOSIS — Z791 Long term (current) use of non-steroidal anti-inflammatories (NSAID): Secondary | ICD-10-CM | POA: Diagnosis not present

## 2015-06-12 DIAGNOSIS — Z720 Tobacco use: Secondary | ICD-10-CM | POA: Insufficient documentation

## 2015-06-12 DIAGNOSIS — Z79899 Other long term (current) drug therapy: Secondary | ICD-10-CM | POA: Insufficient documentation

## 2015-06-12 LAB — URINALYSIS, ROUTINE W REFLEX MICROSCOPIC
Bilirubin Urine: NEGATIVE
Glucose, UA: NEGATIVE mg/dL
Hgb urine dipstick: NEGATIVE
Ketones, ur: NEGATIVE mg/dL
Leukocytes, UA: NEGATIVE
Nitrite: NEGATIVE
Protein, ur: NEGATIVE mg/dL
Specific Gravity, Urine: 1.018 (ref 1.005–1.030)
Urobilinogen, UA: 1 mg/dL (ref 0.0–1.0)
pH: 5.5 (ref 5.0–8.0)

## 2015-06-12 LAB — CBC
HCT: 46.6 % (ref 39.0–52.0)
Hemoglobin: 15.9 g/dL (ref 13.0–17.0)
MCH: 33 pg (ref 26.0–34.0)
MCHC: 34.1 g/dL (ref 30.0–36.0)
MCV: 96.7 fL (ref 78.0–100.0)
Platelets: 268 10*3/uL (ref 150–400)
RBC: 4.82 MIL/uL (ref 4.22–5.81)
RDW: 13.4 % (ref 11.5–15.5)
WBC: 10.7 10*3/uL — ABNORMAL HIGH (ref 4.0–10.5)

## 2015-06-12 LAB — COMPREHENSIVE METABOLIC PANEL
ALT: 12 U/L — ABNORMAL LOW (ref 17–63)
AST: 16 U/L (ref 15–41)
Albumin: 3.8 g/dL (ref 3.5–5.0)
Alkaline Phosphatase: 69 U/L (ref 38–126)
Anion gap: 7 (ref 5–15)
BUN: 10 mg/dL (ref 6–20)
CO2: 25 mmol/L (ref 22–32)
Calcium: 9.3 mg/dL (ref 8.9–10.3)
Chloride: 107 mmol/L (ref 101–111)
Creatinine, Ser: 1.05 mg/dL (ref 0.61–1.24)
GFR calc Af Amer: >60 mL/min (ref >60)
GFR calc non Af Amer: >60 mL/min (ref >60)
Glucose, Bld: 85 mg/dL (ref 65–99)
Potassium: 4.1 mmol/L (ref 3.5–5.1)
Sodium: 139 mmol/L (ref 135–145)
Total Bilirubin: 0.6 mg/dL (ref 0.3–1.2)
Total Protein: 7.4 g/dL (ref 6.5–8.1)

## 2015-06-12 LAB — LIPASE, BLOOD: Lipase: 17 U/L — ABNORMAL LOW (ref 22–51)

## 2015-06-12 IMAGING — CT CT ABD-PELV W/ CM
2 of 6 series · 14 of 46 positions shown, 18 images · IV contrast (OMNIPAQUE 300)
Comparison: None.

CLINICAL DATA: Left lower quadrant pain radiating into the right
abdomen since [DATE]. Elevated white blood cell count. Nausea,
vomiting and diarrhea.

EXAM:
CT ABDOMEN AND PELVIS WITH CONTRAST
TECHNIQUE: Multidetector CT imaging of the abdomen and pelvis was performed
using the standard protocol following bolus administration of
intravenous contrast.
CONTRAST:  100 cc Omnipaque 300.

[Series 2: abd/pel with · axial · 0.74mm/px · z∈[-419,-59]mm · 11 of 86 slices shown, 15 images]
[im 9/86  soft-tissue]
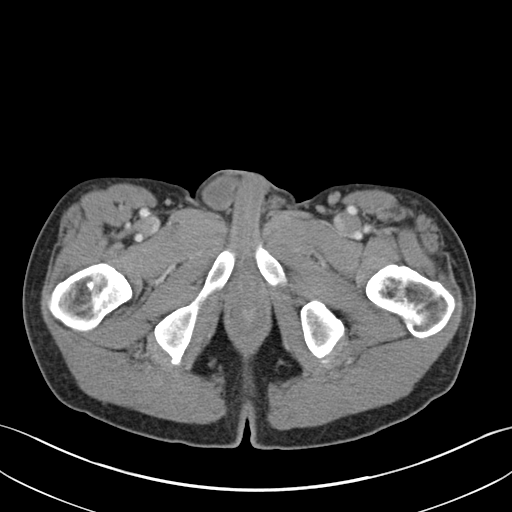
[im 9/86  bone]
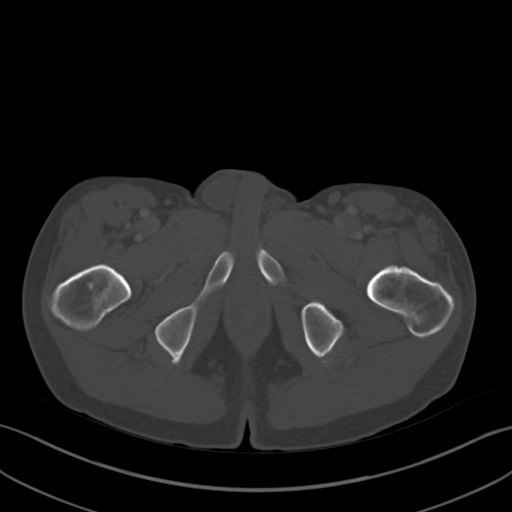
[im 17/86  soft-tissue]
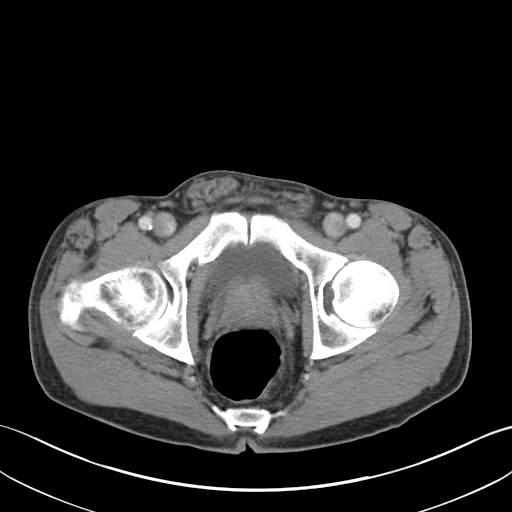
[im 25/86  soft-tissue]
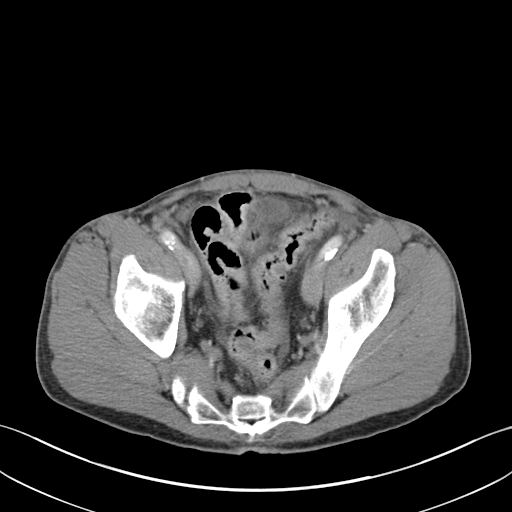
[im 33/86  soft-tissue]
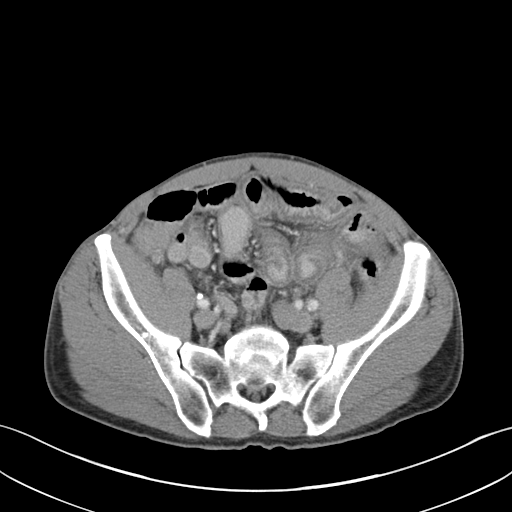
[im 45/86  soft-tissue]
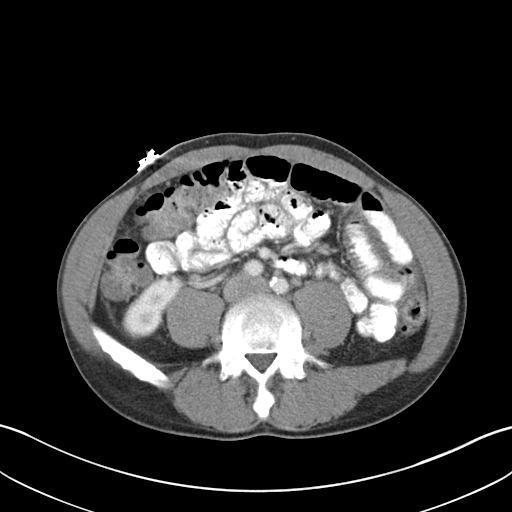
[im 53/86  soft-tissue]
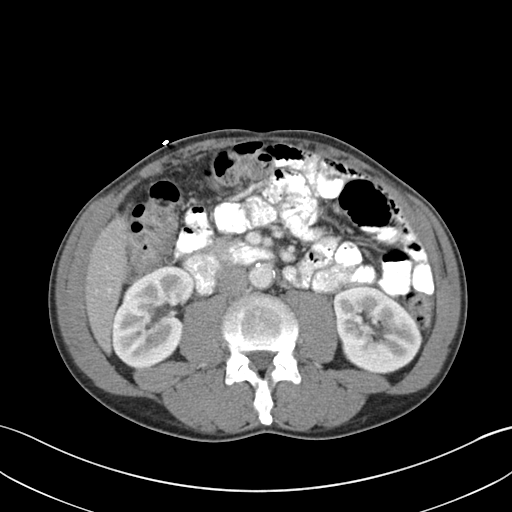
[im 61/86  soft-tissue]
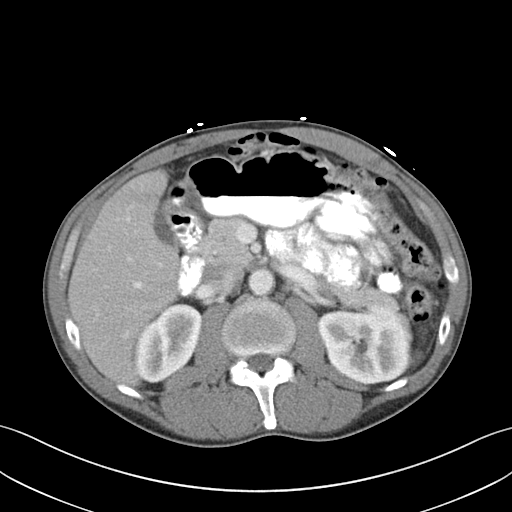
[im 69/86  soft-tissue]
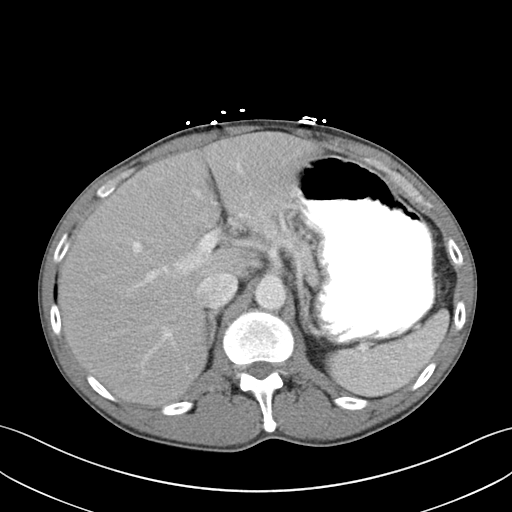
[im 69/86  lung]
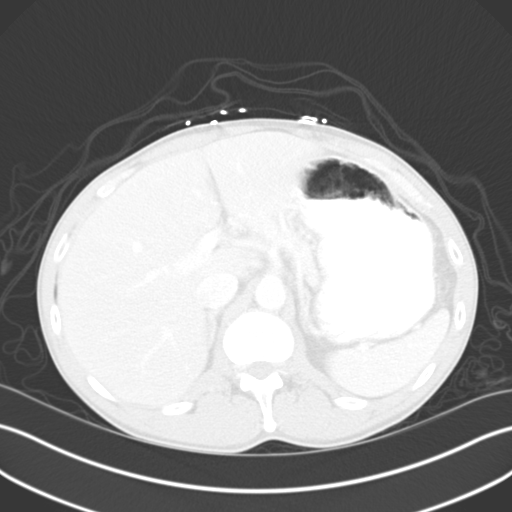
[im 73/86  lung]
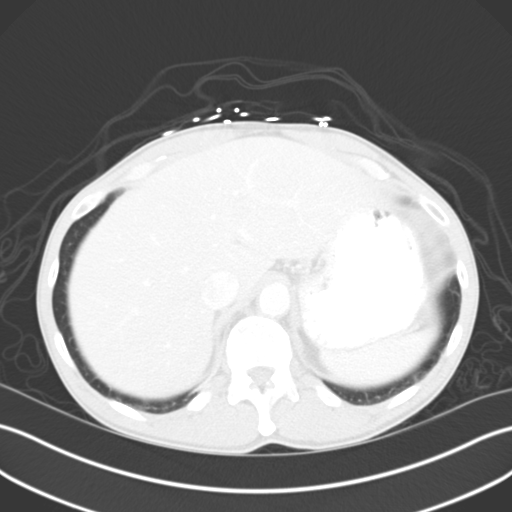
[im 77/86  soft-tissue]
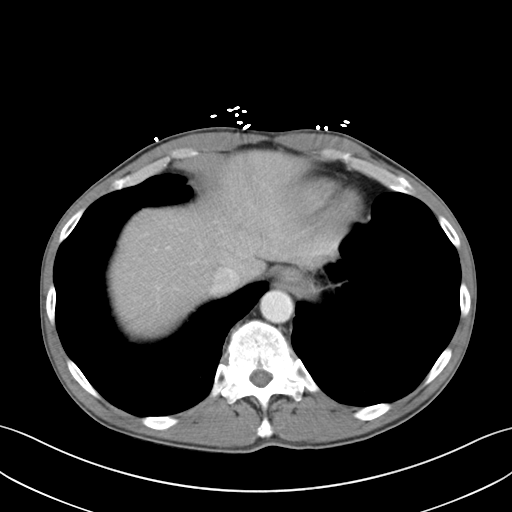
[im 77/86  lung]
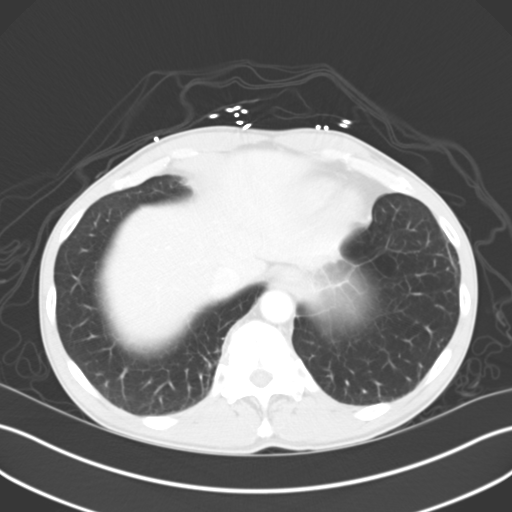
[im 77/86  bone]
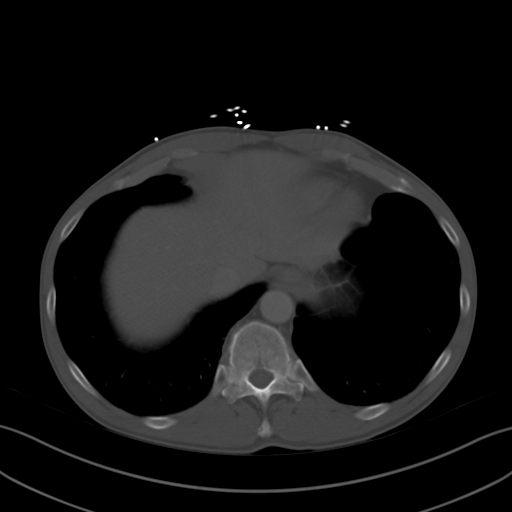
[im 81/86  lung]
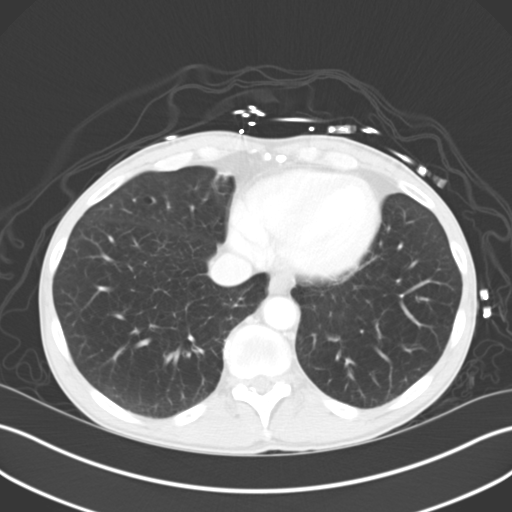

[Series 3: coronal a/|p · coronal · 0.71mm/px · 3 of 87 slices shown]
[im 29/87  soft-tissue]
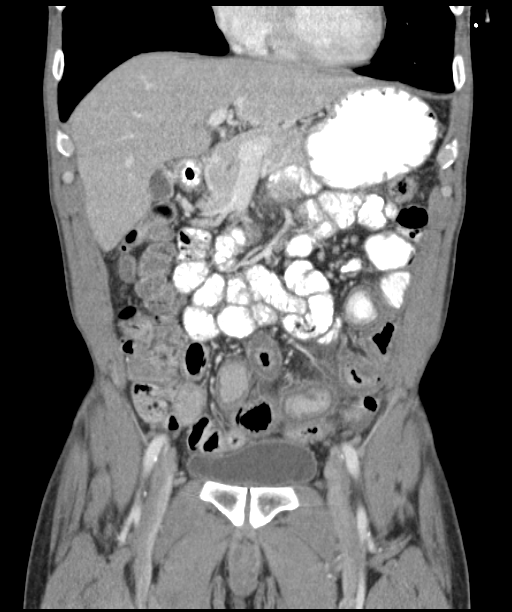
[im 39/87  soft-tissue]
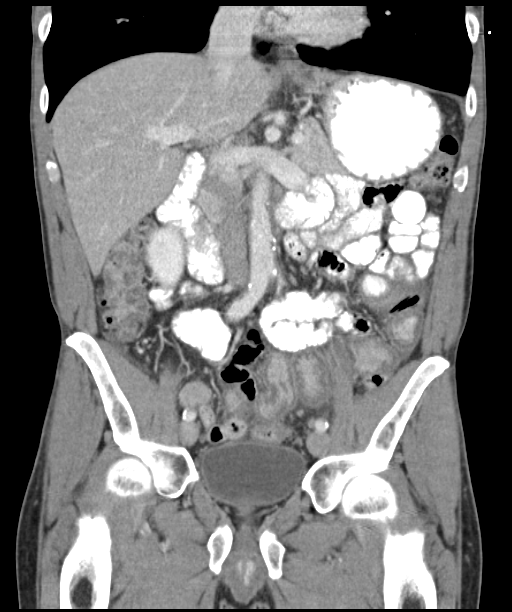
[im 48/87  soft-tissue]
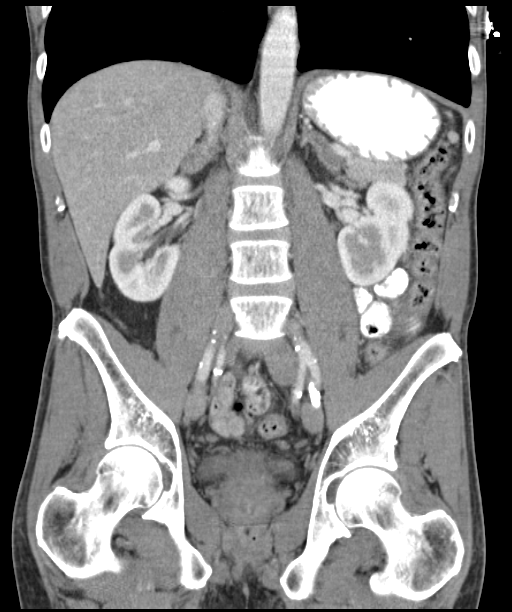

[14 of 46 positions shown; findings below may reference images not displayed]

FINDINGS: The lung bases demonstrate emphysematous disease are clear. No
pleural or pericardial effusion.

There is thickening of the walls of multiple loops of jejunum in the
left abdomen. No pneumatosis, portal venous gas, free
intraperitoneal air or focal fluid collection is identified. There
is a trace amount of free pelvic fluid. Small bowel, including the
terminal ileum, is otherwise unremarkable. There is no small bowel
obstruction. The stomach and colon appear normal. There is no
evidence of appendicitis.

The gallbladder, spleen, kidneys, pancreas and biliary tree are
unremarkable. There is mild thickening of the adrenal glands
bilaterally most compatible with hyperplasia. A 0.4 cm hypo
attenuating lesion in the right lobe of liver on image 21 cannot be
definitively characterized but is likely a cyst. Scattered
aortoiliac atherosclerosis without aneurysm is noted. Major branch
vessels of the aorta opacified normally. No lymphadenopathy is
identified.

No focal bony abnormality is seen.
IMPRESSION: Wall thickening of loops of small bowel in the left lower quadrant
of the abdomen has an appearance most consistent with infectious or
inflammatory enteritis. No CT signs of bowel ischemia are
identified. The terminal ileum appears normal.

Aortoiliac atherosclerosis without aneurysm.

Emphysema.

## 2015-06-12 MED ORDER — PROMETHAZINE HCL 25 MG PO TABS: 25.0000 mg | ORAL_TABLET | Freq: Four times a day (QID) | ORAL | Status: DC | PRN

## 2015-06-12 MED ORDER — DIPHENOXYLATE-ATROPINE 2.5-0.025 MG PO TABS: 1.0000 | ORAL_TABLET | Freq: Four times a day (QID) | ORAL | Status: DC | PRN

## 2015-06-12 MED ORDER — IOHEXOL 300 MG/ML  SOLN
50.0000 mL | Freq: Once | INTRAMUSCULAR | Status: AC | PRN
  Administered 2015-06-12: 50 mL via ORAL

## 2015-06-12 MED ORDER — IOHEXOL 300 MG/ML  SOLN
100.0000 mL | Freq: Once | INTRAMUSCULAR | Status: AC | PRN
  Administered 2015-06-12: 100 mL via INTRAVENOUS

## 2015-06-12 MED ORDER — HYDROCODONE-ACETAMINOPHEN 5-325 MG PO TABS: 1.0000 | ORAL_TABLET | ORAL | Status: DC | PRN

## 2015-06-12 MED ORDER — SODIUM CHLORIDE 0.9 % IV BOLUS (SEPSIS)
1000.0000 mL | Freq: Once | INTRAVENOUS | Status: AC
  Administered 2015-06-12: 1000 mL via INTRAVENOUS

## 2015-06-12 MED ORDER — ONDANSETRON HCL 4 MG/2ML IJ SOLN
4.0000 mg | Freq: Once | INTRAMUSCULAR | Status: AC
  Administered 2015-06-12: 4 mg via INTRAVENOUS
  Filled 2015-06-12: qty 2

## 2015-06-12 NOTE — ED Notes (Signed)
Patient transported to CT 

## 2015-06-12 NOTE — Discharge Instructions (Signed)

## 2015-06-12 NOTE — ED Notes (Signed)
Pt states he has had abdominal pain since Saturday.  Emesis x 1 this morning.  Noted redness to emesis but had drank cranberry juice.  Pt had diarrhea x 3 this morning.  No fever.

## 2015-06-12 NOTE — ED Provider Notes (Signed)
CSN: [id number]     Arrival date & time 06/12/15  0840 History   First MD Initiated Contact with Patient 06/12/15 1014     Chief Complaint  Patient presents with  . Abdominal Pain  . Emesis     (Consider location/radiation/quality/duration/timing/severity/associated sxs/prior Treatment) HPI Comments: Patient presents to the emergency room for evaluation of abdominal pain. Symptoms began 2 days ago. Patient complaining of epigastric pain and left lower quadrant pain. Symptoms have been persistent. He has had nausea and has one episode of emesis. He has expressed diarrhea 3 times this morning. He has not had any fever. There is no chest pain or shortness of breath.  Patient is a 54 y.o. male presenting with abdominal pain and vomiting.  Abdominal Pain Associated symptoms: diarrhea, nausea and vomiting   Emesis Associated symptoms: abdominal pain and diarrhea     Past Medical History  Diagnosis Date  . High blood pressure   . COPD (chronic obstructive pulmonary disease) 2004    diagnosed in 2004, no PFT's to date.  Started on home O2 12/2013, after found to be desatting at PCP's office, and referred to pulmonology.  . Seasonal allergies    Past Surgical History  Procedure Laterality Date  . Appendectomy  1980  . Hip surgery Right   . Shoulder arthroscopy Right    Family History  Problem Relation Age of Onset  . Emphysema Mother   . Emphysema Father   . Allergies Mother     "everyone in family"  . Allergies Father   . Allergies Son   . Asthma Mother     "everyone in family"  . Heart disease Mother   . Heart disease Maternal Aunt   . Clotting disorder Mother   . Cancer Mother    Social History  Substance Use Topics  . Smoking status: Current Every Day Smoker -- 0.10 packs/day for 36 years    Types: Cigarettes  . Smokeless tobacco: Never Used     Comment: Was up to 2 ppd, down to 5 cigs/day.  . Alcohol Use: 8.4 - 12.6 oz/week    14-21 Standard drinks or equivalent per  week     Comment: 2-3 beers/day, with occasional "binges".  No history of withdrawal    Review of Systems  Gastrointestinal: Positive for nausea, vomiting, abdominal pain and diarrhea.  All other systems reviewed and are negative.     Allergies  Penicillins and Sulfa antibiotics  Home Medications   Prior to Admission medications   Medication Sig Start Date End Date Taking? Authorizing Provider  Aclidinium Bromide (TUDORZA PRESSAIR) 400 MCG/ACT AEPB Inhale 1 puff into the lungs 2 (two) times daily. 04/05/15   Tammy S Parrett, NP  albuterol (PROVENTIL) (2.5 MG/3ML) 0.083% nebulizer solution Take 2.5 mg by nebulization every 4 (four) hours as needed for wheezing or shortness of breath.     Historical Provider, MD  amLODipine (NORVASC) 10 MG tablet Take 10 mg by mouth every morning.     Historical Provider, MD  benztropine (COGENTIN) 0.5 MG tablet 1 tablet at bedtime    Historical Provider, MD  budesonide-formoterol (SYMBICORT) 160-4.5 MCG/ACT inhaler Inhale 2 puffs into the lungs 2 (two) times daily. 04/05/15   Tammy S Parrett, NP  cetirizine (ZYRTEC) 10 MG tablet Take 1 tablet (10 mg total) by mouth daily. Patient taking differently: Take 10 mg by mouth at bedtime.  09/15/14   [name], MD  loxapine (LOXITANE) 10 MG capsule Take 10 mg by mouth at  bedtime.     Historical Provider, MD  meloxicam (MOBIC) 7.5 MG tablet Take 1 tablet by mouth every morning    Historical Provider, MD  mirtazapine (REMERON) 30 MG tablet Take 30 mg by mouth at bedtime.    Historical Provider, MD  NON FORMULARY Oxygen 2 liters at bedtime 3-4 liters with walking    Historical Provider, MD  omeprazole (PRILOSEC) 40 MG capsule Take 1 capsule (40 mg total) by mouth daily. 09/15/14   [name], MD  PROVENTIL HFA 108 (90 BASE) MCG/ACT inhaler INHALE 1-2 PUFFS INTO THE LUNGS EVERY 6 (SIX) HOURS AS NEEDED FOR WHEEZING OR SHORTNESS OF BREATH. 05/23/15   [name], MD  risperiDONE (RISPERDAL) 2 MG tablet Take 2  mg by mouth at bedtime.    Historical Provider, MD  valsartan (DIOVAN) 160 MG tablet Take 1 tablet (160 mg total) by mouth daily. 08/30/14   [name], MD   BP 109/77 mmHg  Pulse 74  Resp 19  SpO2 96% Physical Exam  Constitutional: He is oriented to person, place, and time. He appears well-developed and well-nourished. No distress.  HENT:  Head: Normocephalic and atraumatic.  Right Ear: Hearing normal.  Left Ear: Hearing normal.  Nose: Nose normal.  Mouth/Throat: Oropharynx is clear and moist and mucous membranes are normal.  Eyes: Conjunctivae and EOM are normal. Pupils are equal, round, and reactive to light.  Neck: Normal range of motion. Neck supple.  Cardiovascular: Regular rhythm, S1 normal and S2 normal.  Exam reveals no gallop and no friction rub.   No murmur heard. Pulmonary/Chest: Effort normal and breath sounds normal. No respiratory distress. He exhibits no tenderness.  Abdominal: Soft. Normal appearance and bowel sounds are normal. There is no hepatosplenomegaly. There is tenderness in the epigastric area and left lower quadrant. There is no rebound, no guarding, no tenderness at McBurney's point and negative Murphy's sign. No hernia.  Musculoskeletal: Normal range of motion.  Neurological: He is alert and oriented to person, place, and time. He has normal strength. No cranial nerve deficit or sensory deficit. Coordination normal. GCS eye subscore is 4. GCS verbal subscore is 5. GCS motor subscore is 6.  Skin: Skin is warm, dry and intact. No rash noted. No cyanosis.  Psychiatric: He has a normal mood and affect. His speech is normal and behavior is normal. Thought content normal.  Nursing note and vitals reviewed.   ED Course  Procedures (including critical care time) Labs Review Labs Reviewed  CBC - Abnormal; Notable for the following:    WBC 10.7 (*)    All other components within normal limits  LIPASE, BLOOD  COMPREHENSIVE METABOLIC PANEL  URINALYSIS, ROUTINE  W REFLEX MICROSCOPIC (NOT AT [facility])    Imaging Review No results found. I, Breandan People J., personally reviewed and evaluated these images and lab results as part of my medical decision-making.   EKG Interpretation None      MDM   Final diagnoses:  None   nausea and vomiting  Diarrhea  Abdominal pain  Patient presents to the ER for evaluation of nausea, vomiting, diarrhea. He has had associated abdominal pain, mostly epigastric but there is left lower abdominal pain as well. Examination revealed tenderness in these areas without guarding, rebound or signs of acute surgical process. Lab work was unremarkable. CT scan performed to rule out diverticulitis and other significant process. Patient does have jejunal wall thickening consistent with enteritis, will not require any specific treatment. Will treat symptomatically and  follow-up with PCP.    Orpah Greek, MD 06/12/15 1154

## 2015-06-21 ENCOUNTER — Other Ambulatory Visit: Payer: Self-pay | Admitting: Internal Medicine

## 2015-07-31 ENCOUNTER — Encounter: Payer: Self-pay | Admitting: Internal Medicine

## 2015-07-31 ENCOUNTER — Ambulatory Visit (INDEPENDENT_AMBULATORY_CARE_PROVIDER_SITE_OTHER): Payer: Medicaid Other | Admitting: Internal Medicine

## 2015-07-31 VITALS — BP 122/70 | HR 77 | Ht 71.0 in | Wt 150.4 lb

## 2015-07-31 DIAGNOSIS — F17200 Nicotine dependence, unspecified, uncomplicated: Secondary | ICD-10-CM

## 2015-07-31 DIAGNOSIS — Z23 Encounter for immunization: Secondary | ICD-10-CM

## 2015-07-31 DIAGNOSIS — F17210 Nicotine dependence, cigarettes, uncomplicated: Secondary | ICD-10-CM

## 2015-07-31 DIAGNOSIS — R0902 Hypoxemia: Secondary | ICD-10-CM | POA: Diagnosis not present

## 2015-07-31 DIAGNOSIS — J449 Chronic obstructive pulmonary disease, unspecified: Secondary | ICD-10-CM

## 2015-07-31 DIAGNOSIS — Z720 Tobacco use: Secondary | ICD-10-CM

## 2015-07-31 MED ORDER — ALBUTEROL SULFATE HFA 108 (90 BASE) MCG/ACT IN AERS: INHALATION_SPRAY | RESPIRATORY_TRACT | Status: DC

## 2015-07-31 NOTE — Patient Instructions (Addendum)
Prevnar 13 and flu shots today  Plan A =  Automatic =  symbicort 160 Take 2 puffs first thing in am and then another 2 puffs about 12 hours later and tudorza one twice daily as well                                        Plan B = Backup- Proair use it only if you can't your breath - ok to use up to 2 pffs  every 4 hours if needed  Plan C = Nebulizer up to every 4 hours if needed only if you try plan B first and it doesn't work  The key is to stop smoking completely before smoking completely stops you!  No need for 02 at bedtime or with nl activities, use 3lpm with heavy exertion as needed   Please see patient coordinator before you leave today  to schedule pulmonary rehab   Please schedule a follow up visit in 3 months but call sooner if needed

## 2015-07-31 NOTE — Progress Notes (Signed)
Subjective:    Patient ID: SUNG Sullivan, male    DOB: [birth year]  MRN: [id number]    Brief patient profile:  50  yobm active smoker with breathing difficulties since 2004 dx as copd on advair 250 /50 but getting worse since 2014 and placed on 02 for the first time in March 2015 and referred 01/07/2014 to pulmonary clinic by Dr Kennon Holter with GOLD II copd criteria established 02/14/14    History of Present Illness  01/07/2014 1st [name]  Chief Complaint  Patient presents with  . Advice Only    Referred for COPD, SOB.  Pt c/o SOB, low 02, prod cough with white mucous.  Pt forgot med list, unsure of what all he is taking  still can do a [employer] but uses [employer] [facility], assoc with congested coughing esp in am worse in winter. rec Stop advair   Plan A =  Automatic =  symbicort 160 Take 2 puffs first thing in am and then another 2 puffs about 12 hours later. spiriva in am only  Plan B = Backup- Proventil use it only if you can't your breath - ok to use up to every 4 hours if needed Plan C = Nebulizer up to every 4 hours if needed only if you try plan B first and it doesn't work   02/14/2014 f/u ov/Tony Sullivan re: copd GOLD II on ACEi and Advair and still smoking  Chief Complaint  Patient presents with  . Follow-up    Review pft.  Pt c/o prod cough with clear mucuos, SOB with exertion.  Interested in getting a portable 02 concentrator.    rec Plan A =  Automatic =  symbicort 160 Take 2 puffs first thing in am and then another 2 puffs about 12 hours later.                                       spiriva in am only  Plan B = Backup- Proventil use it only if you can't your breath - ok to use up to every 4 hours if needed Plan C = Nebulizer up to every 4 hours if needed only if you try plan B first and it doesn't work The key is to stop smoking completely before smoking completely stops you! 02 should be 3lpm with more than 100 ft walking  Please schedule a follow up visit  in 3 months but call sooner if needed    06/15/2014 f/u ov/Tony Sullivan re: still smoking/ GOLD II copd 02 dep with ambulation / on spiriva/symbicort Chief Complaint  Patient presents with  . Follow-up    Pt reports that cough and SOB has been more increased recently d/t weather change. Using 2.5Liter O2  No need for saba yet in any form / dry hacking cough  rec Stop lisinopril Start valsartan 160 mg one daily  Please see patient coordinator before you leave today  to schedule ambulatory portable system that will deliver  02 at 2.5 lpm to replace your tank     09/15/2014 Follow up and Med Calendar GOLD II COPD s/p exac to ER / still smoking on symbicort and spiriva  Patient returns for a follow-up and medication review. We reviewed all his medications organize them into a medication count with patient education It appears that he is taking his medications correctly but is out , has follow up  with PCP for refills.  Has cut back on smoking .  Doing well w/ no flare of cough or wheezing/dyspnea.  Last visit spiriva was stopped, no flare off inhaler.  Needs refill on omeprazole , Zyrtec and proventil  rec Follow med calendar closely and bring to each visit. Continue current regimen. Continue to work on not smoking.    02/20/2015 f/u ov/Tony Sullivan re: GOLD II / no med calendar/ no meds / still smoking  Chief Complaint  Patient presents with  . Follow-up    Pt c/o of SOB with excertion, wheezing, dry cough. Pt stopped spirvia, and uses nebs and albuterol prn.  no real change doe/ MMRC Grad 1/2 minimal variability  better on 3lpm when he uses it Easily confused with details of care and what he is vs not taking vs has run out - using multiple doses of saba in hfa and neb form more day than noct / saba helps some  >change o2 As needed  With act   03/22/2015 Follow up : COPD /GOLD II  Pt returns for 1 month follow up .  Says he still gets winded easily.  Has cough on/off. No hemoptysis , chest pain  or orthopnea.  Remains on Symbicort Twice daily  .  Remains on  O2 with activity. Does still wear at bedtime .  Uses rescue inhaler most days.  Still smoking . Discussed cessation.  rec Continue on Symbicort 2 puffs Twice daily  , rinse after use.  Begin Tudorza 1 puff Twice daily  , rinse after use.  Work on not smoking   07/31/2015  f/u ov/Tony Sullivan re: GOLD II/ still smoking / over use of saba daily  Chief Complaint  Patient presents with  . Follow-up    Pt reports breathing is fair. C/o prod cough (green phlem x last week), wheezing, chest tx in early AM.    gets panicky when walking outdoors  and uses lots of nebs since doesn't have an hfa saba  Doe = MMRC2 = can't walk a nl pace on a flat grade s sob/ no worse with cough worse this past week   No obvious day to day or daytime variability or assoc   cp or chest tightness, subjective wheeze or overt sinus or hb symptoms. No unusual exp hx or h/o childhood pna/ asthma or knowledge of premature birth.  Sleeping ok without nocturnal  or early am exacerbation  of respiratory  c/o's or need for noct saba. Also denies any obvious fluctuation of symptoms with weather or environmental changes or other aggravating or alleviating factors except as outlined above   Current Medications, Allergies, Complete Past Medical History, Past Surgical History, Family History, and Social History were reviewed in [employer] record.  ROS  The following are not active complaints unless bolded sore throat, dysphagia, dental problems, itching, sneezing,  nasal congestion or excess/ purulent secretions, ear ache,   fever, chills, sweats, unintended wt loss, classically pleuritic or exertional cp, hemoptysis,  orthopnea pnd or leg swelling, presyncope, palpitations, abdominal pain, anorexia, nausea, vomiting, diarrhea  or change in bowel or bladder habits, change in stools or urine, dysuria,hematuria,  rash, arthralgias, visual complaints,  headache, numbness, weakness or ataxia or problems with walking or coordination,  change in mood/affect or memory.                     Objective:   Physical Exam   06/15/2014  164  >  09/15/2014 158 >  02/20/2015 >156 03/22/2015 >  07/31/2015   150      amb mod hoarse bm nad on 02     HEENT mild turbinate edema.  Oropharynx no thrush or excess pnd or cobblestoning.  No JVD or cervical adenopathy. Mild accessory muscle hypertrophy. Trachea midline, nl thryroid. Chest was hyperinflated by percussion with diminished breath sounds and moderate increased exp time without wheeze. Hoover sign positive at mid inspiration. Regular rate and rhythm without murmur gallop or rub or increase P2 or edema.  Abd: no hsm, nl excursion. Ext warm without cyanosis- mod bilateral clubbing       I personally reviewed images and agree with radiology impression as follows:  CXR:  11/24/14 Emphysematous and chronic bronchitic changes in the chest. No evidence of active pulmonary disease.         Assessment & Plan:

## 2015-08-03 NOTE — Assessment & Plan Note (Signed)
-   started on 27 December 2013 by [facility] clinic - 02/14/2014  Walked RA x 3/4  laps @ 185 ft each stopped due to  desat 85% and corrected on 3lpm to x 2 laps  - 06/15/2014  Walked 2.5lpm  2 laps  @ 185 ft each stopped due to  Sob adequate sats  - 02/20/2015  Walked RA x 3 laps @ 185 ft each stopped due to  Sob/desat p 2 laps, nl pace, eliminated on his 02 at 3lpm    As of 07/31/2015  rec  3 lpm  with exertion outside house but no need for 02 room to room

## 2015-08-03 NOTE — Assessment & Plan Note (Signed)
>   3 m  I took an extended  opportunity with this patient to outline the consequences of continued cigarette use  in airway disorders based on all the data we have from the multiple national lung health studies (perfomed over decades at millions of dollars in cost)  indicating that smoking cessation, not choice of inhalers or physicians, is the most important aspect of care.    Not quite yet ready to completely quit smoking although he says he is thinking about.

## 2015-08-03 NOTE — Assessment & Plan Note (Signed)
-   02/14/2014   try symbicort 160 2bid  - PFT's  02/14/2014   FEV1 1.82 (53%) and ratio 47 no better p alb (p spiriva and symbicort 160)  and DLCO 32 with 37%  07/2014 >Spiriva stopped  09/15/2014 Med calendar> not using 02/20/2015   - 02/20/2015 p extensive coaching HFA effectiveness =    90%   DDX of  difficult airways management all start with A and  include Adherence, Ace Inhibitors, Acid Reflux, Active Sinus Disease, Alpha 1 Antitripsin deficiency, Anxiety masquerading as Airways dz,  ABPA,  allergy(esp in young), Aspiration (esp in elderly), Adverse effects of meds,  Active smokers, A bunch of PE's (a small clot burden can't cause this syndrome unless there is already severe underlying pulm or vascular dz with poor reserve) plus two Bs  = Bronchiectasis and Beta blocker use..and one C= CHF  Adherence is always the initial "prime suspect" and is a multilayered concern that requires a "trust but verify" approach in every patient - starting with knowing how to use medications, especially inhalers, correctly, keeping up with refills and understanding the fundamental difference between maintenance and prns vs those medications only taken for a very short course and then stopped and not refilled.   ? Anxiety > usually at the bottom of this list of usual suspects but should be much higher on this pt's based on H and P and note already on psychotropics .   Active smoking > discussed seperately  I had an extended discussion with the patient reviewing all relevant studies completed to date and  lasting 15 to 20 minutes of a 25 minute visit    Each maintenance medication was reviewed in detail including most importantly the difference between maintenance and prns and under what circumstances the prns are to be triggered using an action plan format that is not reflected in the computer generated alphabetically organized AVS.    Please see instructions for details which were reviewed in writing and the patient  given a copy highlighting the part that I personally wrote and discussed at today's ov.

## 2015-08-22 ENCOUNTER — Telehealth (HOSPITAL_COMMUNITY): Payer: Self-pay | Admitting: *Deleted

## 2015-09-14 ENCOUNTER — Telehealth (HOSPITAL_COMMUNITY): Payer: Self-pay | Admitting: *Deleted

## 2015-10-04 ENCOUNTER — Telehealth (HOSPITAL_COMMUNITY): Payer: Self-pay | Admitting: *Deleted

## 2015-10-25 ENCOUNTER — Emergency Department (HOSPITAL_COMMUNITY): Payer: Medicaid Other

## 2015-10-25 ENCOUNTER — Emergency Department (HOSPITAL_COMMUNITY)
Admission: EM | Admit: 2015-10-25 | Discharge: 2015-10-25 | Disposition: A | Discharge status: Home / Self Care | Payer: Medicaid Other | Attending: Emergency Medicine | Admitting: Emergency Medicine

## 2015-10-25 ENCOUNTER — Encounter (HOSPITAL_COMMUNITY): Payer: Self-pay | Admitting: *Deleted

## 2015-10-25 DIAGNOSIS — R079 Chest pain, unspecified: Secondary | ICD-10-CM

## 2015-10-25 DIAGNOSIS — Z880 Allergy status to penicillin: Secondary | ICD-10-CM | POA: Diagnosis not present

## 2015-10-25 DIAGNOSIS — I10 Essential (primary) hypertension: Secondary | ICD-10-CM | POA: Diagnosis not present

## 2015-10-25 DIAGNOSIS — J449 Chronic obstructive pulmonary disease, unspecified: Secondary | ICD-10-CM | POA: Insufficient documentation

## 2015-10-25 DIAGNOSIS — Z79899 Other long term (current) drug therapy: Secondary | ICD-10-CM | POA: Insufficient documentation

## 2015-10-25 DIAGNOSIS — F17210 Nicotine dependence, cigarettes, uncomplicated: Secondary | ICD-10-CM | POA: Diagnosis not present

## 2015-10-25 LAB — BASIC METABOLIC PANEL
Anion gap: 8 (ref 5–15)
BUN: 10 mg/dL (ref 6–20)
CO2: 24 mmol/L (ref 22–32)
Calcium: 9.4 mg/dL (ref 8.9–10.3)
Chloride: 109 mmol/L (ref 101–111)
Creatinine, Ser: 0.97 mg/dL (ref 0.61–1.24)
GFR calc Af Amer: >60 mL/min (ref >60)
GFR calc non Af Amer: >60 mL/min (ref >60)
Glucose, Bld: 94 mg/dL (ref 65–99)
Potassium: 4.1 mmol/L (ref 3.5–5.1)
Sodium: 141 mmol/L (ref 135–145)

## 2015-10-25 LAB — I-STAT TROPONIN, ED: Troponin i, poc: 0 ng/mL (ref 0.00–0.08)

## 2015-10-25 LAB — CBC
HCT: 49.5 % (ref 39.0–52.0)
Hemoglobin: 16.2 g/dL (ref 13.0–17.0)
MCH: 32 pg (ref 26.0–34.0)
MCHC: 32.7 g/dL (ref 30.0–36.0)
MCV: 97.6 fL (ref 78.0–100.0)
Platelets: 270 10*3/uL (ref 150–400)
RBC: 5.07 MIL/uL (ref 4.22–5.81)
RDW: 13.7 % (ref 11.5–15.5)
WBC: 11.6 10*3/uL — ABNORMAL HIGH (ref 4.0–10.5)

## 2015-10-25 IMAGING — DX DG CHEST 2V
2 series · 2 of 2 positions shown · non-contrast
Comparison: [DATE]

CLINICAL DATA: Sternal and LEFT side chest pain for week, history
COPD, hypertension, smoking

EXAM:
CHEST  2 VIEW

[chest pa]
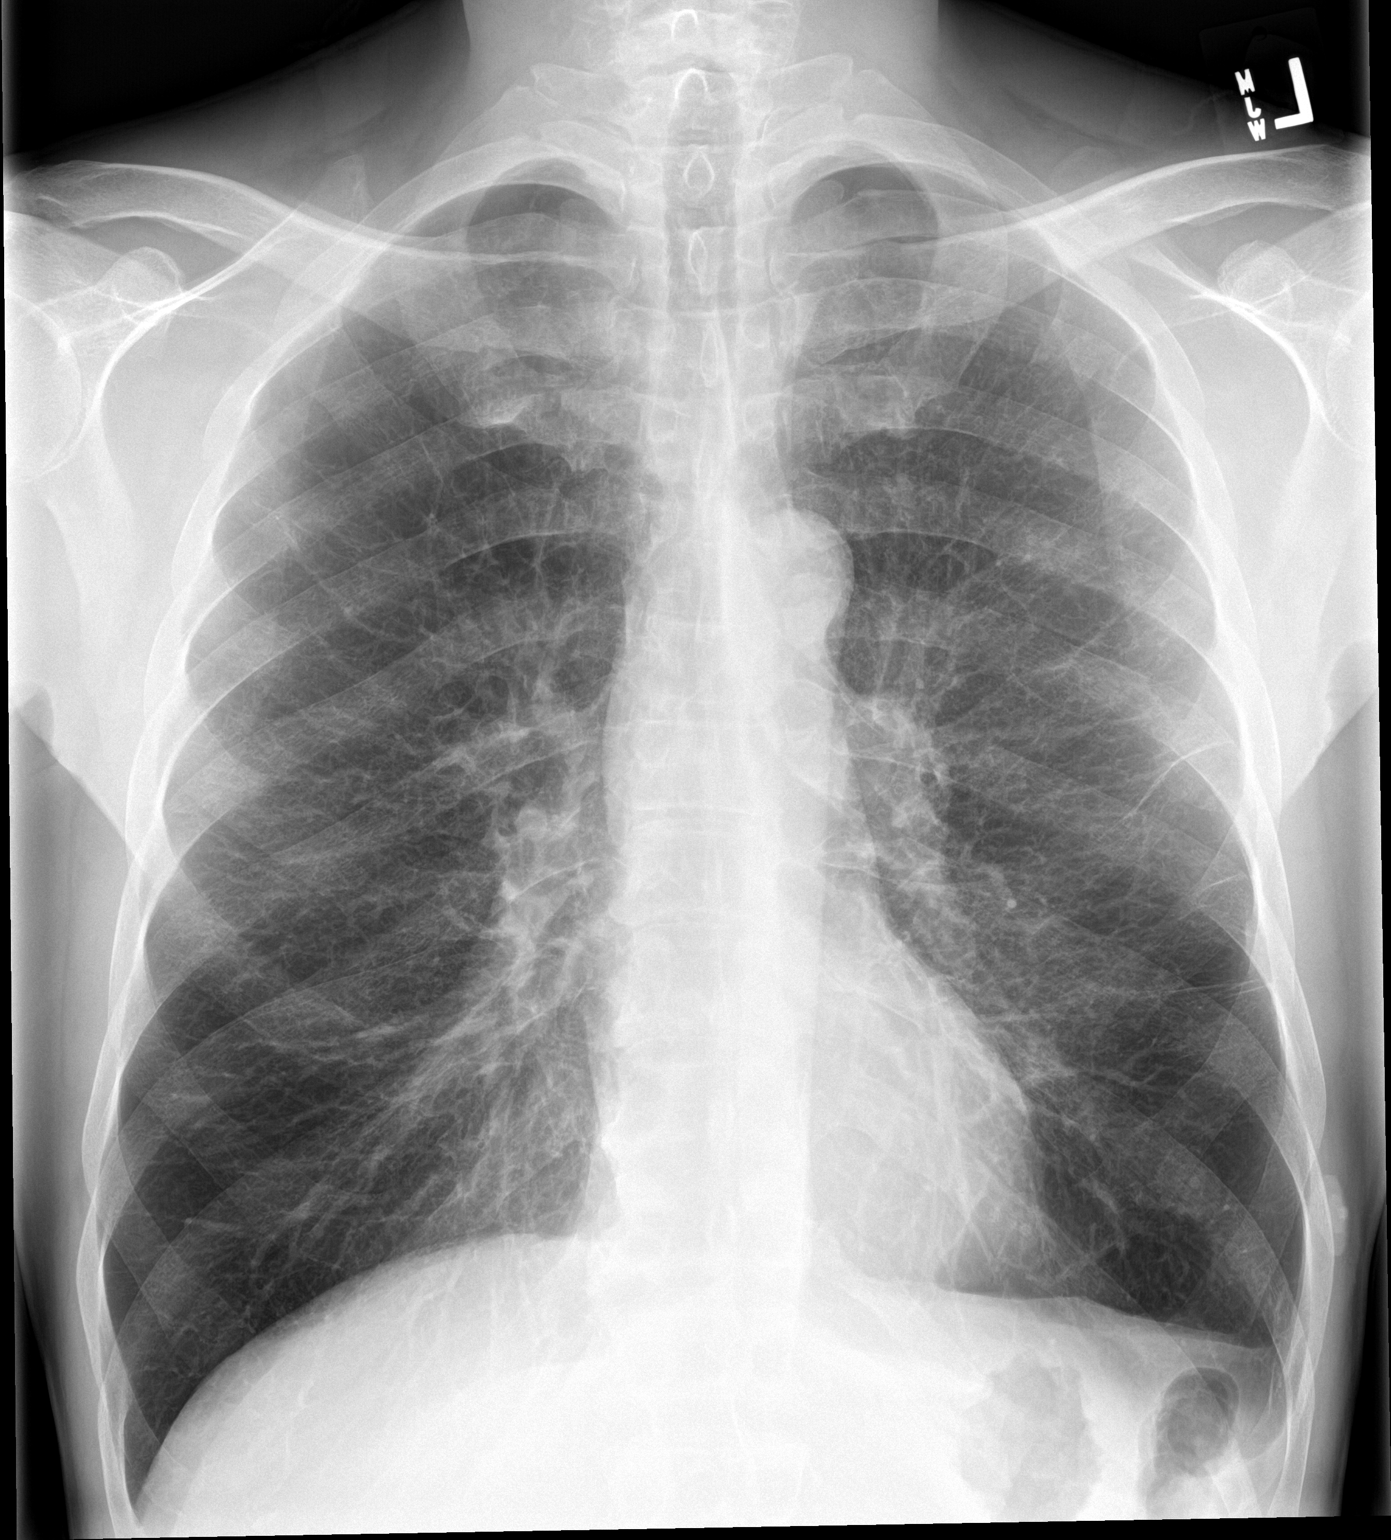

[chest lat]
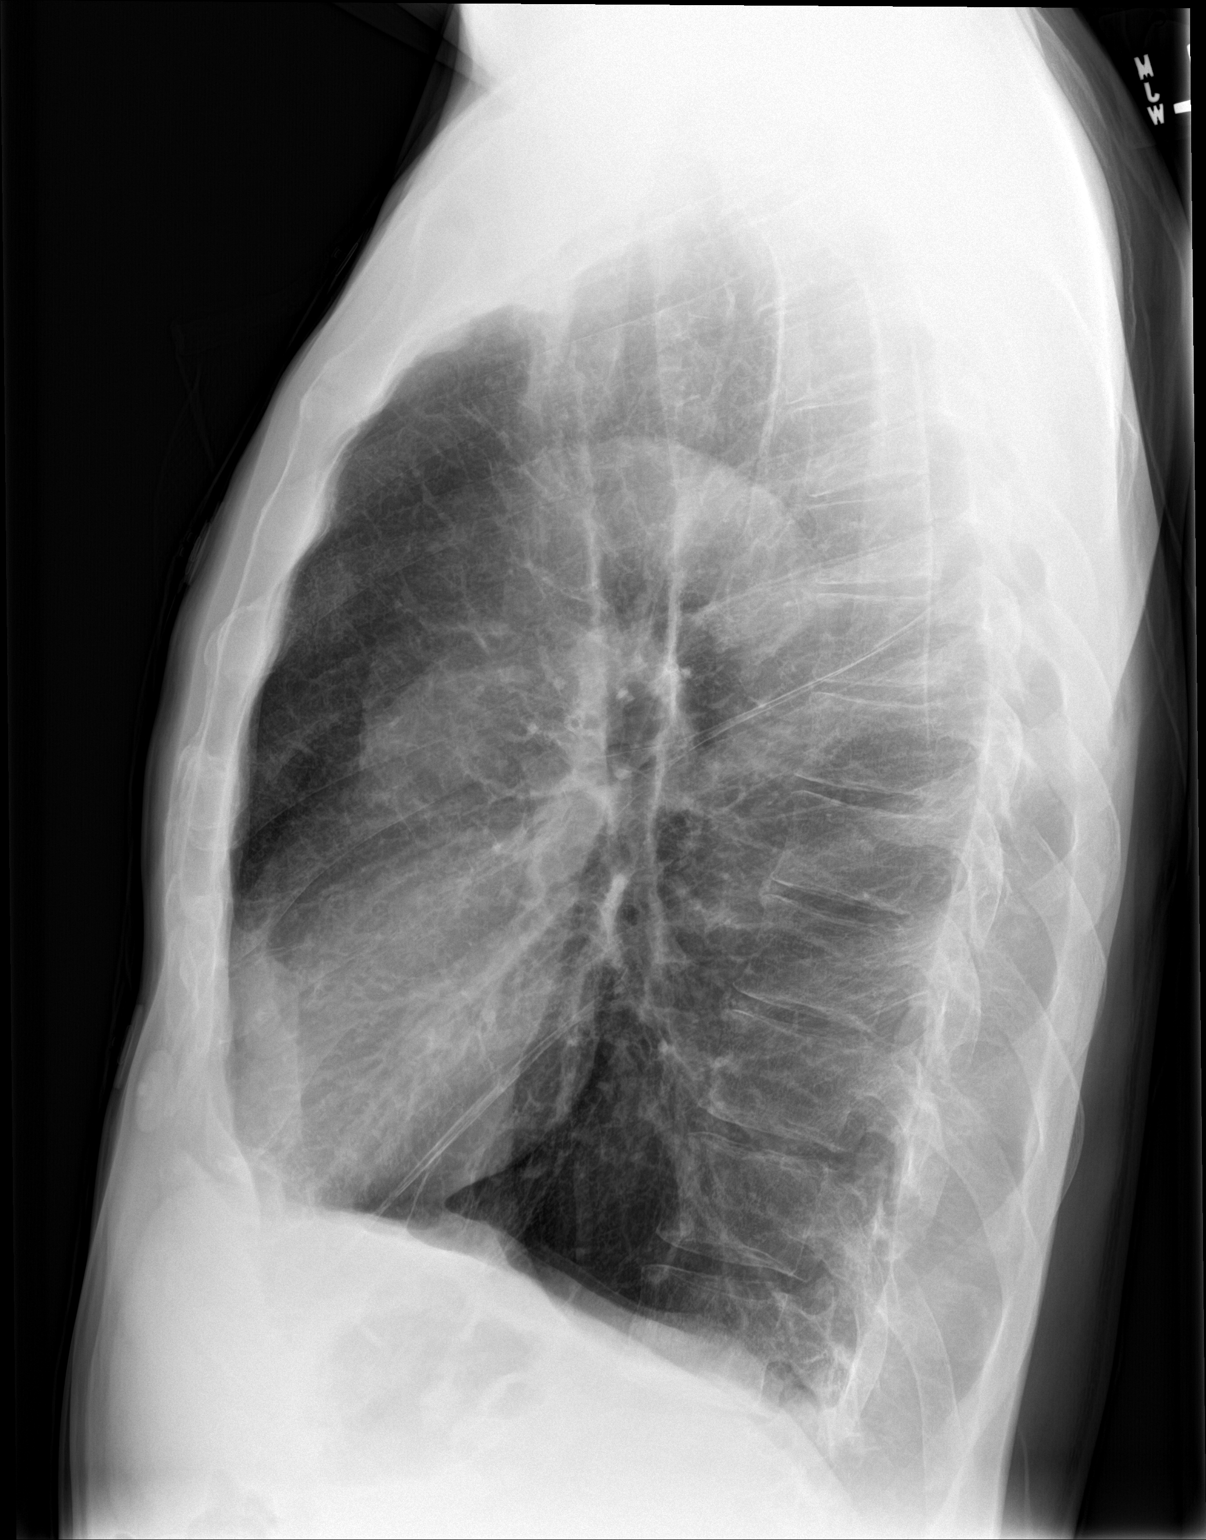

[2 of 2 positions shown; findings below may reference images not displayed]

FINDINGS: Normal heart size, mediastinal contours, and pulmonary vascularity.

Emphysematous changes consistent with COPD.

Linear scarring LEFT mid lung stable.

No acute infiltrate, pleural effusion, or pneumothorax.

Bones unremarkable.
IMPRESSION: COPD changes.

No acute abnormalities.

## 2015-10-25 MED ORDER — GI COCKTAIL ~~LOC~~
30.0000 mL | Freq: Once | ORAL | Status: AC
  Administered 2015-10-25: 30 mL via ORAL
  Filled 2015-10-25: qty 30

## 2015-10-25 MED ORDER — PANTOPRAZOLE SODIUM 20 MG PO TBEC: 20.0000 mg | DELAYED_RELEASE_TABLET | Freq: Every day | ORAL | Status: DC

## 2015-10-25 MED ORDER — KETOROLAC TROMETHAMINE 60 MG/2ML IM SOLN
60.0000 mg | Freq: Once | INTRAMUSCULAR | Status: AC
  Administered 2015-10-25: 60 mg via INTRAMUSCULAR
  Filled 2015-10-25: qty 2

## 2015-10-25 NOTE — Discharge Instructions (Signed)
Chest Pain Observation It is often hard to give a specific diagnosis for the cause of chest pain. Among other possibilities your symptoms might be caused by inadequate oxygen delivery to your heart (angina). Angina that is not treated or evaluated can lead to a heart attack (myocardial infarction) or death. Blood tests, electrocardiograms, and X-rays may have been done to help determine a possible cause of your chest pain. After evaluation and observation, your health care provider has determined that it is unlikely your pain was caused by an unstable condition that requires hospitalization. However, a full evaluation of your pain may need to be completed, with additional diagnostic testing as directed. It is very important to keep your follow-up appointments. Not keeping your follow-up appointments could result in permanent heart damage, disability, or death. If there is any problem keeping your follow-up appointments, you must call your health care provider. HOME CARE INSTRUCTIONS  Due to the slight chance that your pain could be angina, it is important to follow your health care provider's treatment plan and also maintain a healthy lifestyle:  Maintain or work toward achieving a healthy weight.  Stay physically active and exercise regularly.  Decrease your salt intake.  Eat a balanced, healthy diet. Talk to a dietitian to learn about heart-healthy foods.  Increase your fiber intake by including whole grains, vegetables, fruits, and nuts in your diet.  Avoid situations that cause stress, anger, or depression.  Take medicines as advised by your health care provider. Report any side effects to your health care provider. Do not stop medicines or adjust the dosages on your own.  Quit smoking. Do not use nicotine patches or gum until you check with your health care provider.  Keep your blood pressure, blood sugar, and cholesterol levels within normal limits.  Limit alcohol intake to no more than  1 drink per day for women who are not pregnant and 2 drinks per day for men.  Do not abuse drugs. SEEK IMMEDIATE MEDICAL CARE IF: You have severe chest pain or pressure which may include symptoms such as:  You feel pain or pressure in your arms, neck, jaw, or back.  You have severe back or abdominal pain, feel sick to your stomach (nauseous), or throw up (vomit).  You are sweating profusely.  You are having a fast or irregular heartbeat.  You feel short of breath while at rest.  You notice increasing shortness of breath during rest, sleep, or with activity.  You have chest pain that does not get better after rest or after taking your usual medicine.  You wake from sleep with chest pain.  You are unable to sleep because you cannot breathe.  You develop a frequent cough or you are coughing up blood.  You feel dizzy, faint, or experience extreme fatigue.  You develop severe weakness, dizziness, fainting, or chills. Any of these symptoms may represent a serious problem that is an emergency. Do not wait to see if the symptoms will go away. Call your local emergency services (911 in the [place] Do not drive yourself to the hospital. MAKE SURE YOU:  Understand these instructions.  Will watch your condition.  Will get help right away if you are not doing well or get worse.   This information is not intended to replace advice given to you by your health care provider. Make sure you discuss any questions you have with your health care provider.  Follow up with your primary care provider for re-evaluation. If symptoms persist, you  may follow up with cardiology. Take PPI for acid reflux. Return to the Emergency Department if you experience severe increase in your pain, fever, loss of consciousness, dizziness, abdominal pain, difficulty breathing. Encourage smoking cessation.

## 2015-10-25 NOTE — ED Notes (Signed)
Pt reports central chest pain that started 1 week ago. Pt states that this is worse with movement. Pt states that he has also had tingling in his left hand.

## 2015-10-27 NOTE — ED Provider Notes (Signed)
CSN: [id number]     Arrival date & time 10/25/15  1008 History   First MD Initiated Contact with Patient 10/25/15 1308     Chief Complaint  Patient presents with  . Chest Pain     (Consider location/radiation/quality/duration/timing/severity/associated sxs/prior Treatment) HPI   Tony Sullivan is a 54 y.o M with a pmhx of COPD, HTN who presents to the ED today c/o chest pain. Pt states that he has had substernal chest pain that has been constant for 1 week. Pain is worse with movement and with lying down. Pain does not radiate. Pt also has tingling in his left fingertips that has been intermittently presents over the last month. Denies diaphoresis, syncope, dizziness, lightheadedness, fevers, chills. Pt has chronic cough associated with his COPD. No personal or family cardiac history. Pt smokes approximately 1/4 pack per week.  Past Medical History  Diagnosis Date  . High blood pressure   . COPD (chronic obstructive pulmonary disease) ([place]) 2004    diagnosed in 2004, no PFT's to date.  Started on home O2 12/2013, after found to be desatting at PCP's office, and referred to pulmonology.  . Seasonal allergies    Past Surgical History  Procedure Laterality Date  . Appendectomy  1980  . Hip surgery Right   . Shoulder arthroscopy Right    Family History  Problem Relation Age of Onset  . Emphysema Mother   . Emphysema Father   . Allergies Mother     "everyone in family"  . Allergies Father   . Allergies Son   . Asthma Mother     "everyone in family"  . Heart disease Mother   . Heart disease Maternal Aunt   . Clotting disorder Mother   . Cancer Mother    Social History  Substance Use Topics  . Smoking status: Current Every Day Smoker -- 0.10 packs/day for 36 years    Types: Cigarettes  . Smokeless tobacco: Never Used     Comment: Was up to 2 ppd, down to 5 cigs/day.  . Alcohol Use: 8.4 - 12.6 oz/week    14-21 Standard drinks or equivalent per week     Comment: 2-3 beers/day,  with occasional "binges".  No history of withdrawal    Review of Systems  All other systems reviewed and are negative.     Allergies  Penicillins and Sulfa antibiotics  Home Medications   Prior to Admission medications   Medication Sig Start Date End Date Taking? Authorizing Provider  Aclidinium Bromide (TUDORZA PRESSAIR) 400 MCG/ACT AEPB Inhale 1 puff into the lungs 2 (two) times daily. 04/05/15   Tammy S Parrett, NP  albuterol (PROAIR HFA) 108 (90 BASE) MCG/ACT inhaler 2 puffs up to every 4 hours if needed for short of breath 07/31/15   [name], MD  albuterol (PROVENTIL) (2.5 MG/3ML) 0.083% nebulizer solution Take 2.5 mg by nebulization every 4 (four) hours as needed for wheezing or shortness of breath.     Historical Provider, MD  amLODipine (NORVASC) 10 MG tablet Take 10 mg by mouth every morning.     Historical Provider, MD  benztropine (COGENTIN) 0.5 MG tablet Take 0.5 mg by mouth at bedtime.     Historical Provider, MD  budesonide-formoterol (SYMBICORT) 160-4.5 MCG/ACT inhaler Inhale 2 puffs into the lungs 2 (two) times daily. 04/05/15   Tammy S Parrett, NP  cetirizine (ZYRTEC) 10 MG tablet Take 1 tablet (10 mg total) by mouth daily. Patient taking differently: Take 10 mg by mouth at  bedtime.  09/15/14   [name], MD  loxapine (LOXITANE) 10 MG capsule Take 10 mg by mouth at bedtime.     Historical Provider, MD  meloxicam (MOBIC) 7.5 MG tablet Take 7.5 mg by mouth daily as needed for pain.     Historical Provider, MD  mirtazapine (REMERON) 30 MG tablet Take 30 mg by mouth at bedtime.    Historical Provider, MD  omeprazole (PRILOSEC) 40 MG capsule Take 1 capsule (40 mg total) by mouth daily. 09/15/14   [name], MD  OXYGEN Inhale 3-4 L into the lungs continuous. Uses 3L at bedtime and 3 to 4 L's while walking    Historical Provider, MD  pantoprazole (PROTONIX) 20 MG tablet Take 1 tablet (20 mg total) by mouth daily. 10/25/15   Lillie Bollig Tripp Nieshia Larmon, PA-C  risperiDONE  (RISPERDAL) 2 MG tablet Take 2 mg by mouth at bedtime.    Historical Provider, MD  valsartan (DIOVAN) 160 MG tablet Take 1 tablet (160 mg total) by mouth daily. 08/30/14   [name], MD   BP 140/93 mmHg  Pulse 68  Temp(Src) 97.4 F (36.3 C) (Oral)  Resp 17  Ht '5\' 11"'$  (1.803 m)  Wt 70.308 kg  BMI 21.63 kg/m2  SpO2 98% Physical Exam  Constitutional: He is oriented to person, place, and time. He appears well-developed and well-nourished. No distress.  HENT:  Head: Normocephalic and atraumatic.  Mouth/Throat: Oropharynx is clear and moist. No oropharyngeal exudate.  Eyes: Conjunctivae and EOM are normal. Pupils are equal, round, and reactive to light. Right eye exhibits no discharge. Left eye exhibits no discharge. No scleral icterus.  Neck: Normal range of motion. Neck supple. No JVD present.  Cardiovascular: Normal rate, regular rhythm, normal heart sounds and intact distal pulses.  Exam reveals no gallop and no friction rub.   No murmur heard. Pulmonary/Chest: Effort normal and breath sounds normal. No respiratory distress. He has no wheezes. He has no rales. He exhibits no tenderness.  Abdominal: Soft. He exhibits no distension. There is no tenderness. There is no guarding.  Musculoskeletal: Normal range of motion. He exhibits no edema.  Lymphadenopathy:    He has no cervical adenopathy.  Neurological: He is alert and oriented to person, place, and time. No cranial nerve deficit. He exhibits normal muscle tone. Coordination normal.  Strength 5/5 throughout. No sensory deficits.  No gait abnormality.  Skin: Skin is warm and dry. No rash noted. He is not diaphoretic. No erythema. No pallor.  Psychiatric: He has a normal mood and affect. His behavior is normal.  Nursing note and vitals reviewed.   ED Course  Procedures (including critical care time) Labs Review Labs Reviewed  CBC - Abnormal; Notable for the following:    WBC 11.6 (*)    All other components within normal  limits  BASIC METABOLIC PANEL  I-STAT TROPOININ, ED    Imaging Review No results found.  Results will not post to note. See imaging history to view CXR results from today.  I have personally reviewed and evaluated these images and lab results as part of my medical decision-making.   EKG Interpretation   Date/Time:  Wednesday October 25 2015 10:16:37 EST Ventricular Rate:  81 PR Interval:  156 QRS Duration: 82 QT Interval:  368 QTC Calculation: 427 R Axis:   -145 Text Interpretation:  Normal sinus rhythm Biatrial enlargement Right  superior axis deviation Pulmonary disease pattern Abnormal ECG No  significant change since last tracing Confirmed by  KNOTT MD, Quillian Quince  [phone]) on 10/25/2015 1:41:58 PM      MDM   Final diagnoses:  Chest pain, unspecified chest pain type    Patient is to be discharged with recommendation to follow up with PCP in regards to today's hospital visit. Chest pain is not likely of cardiac or pulmonary etiology d/t presentation, perc negative, VSS, no tracheal deviation, no JVD or new murmur, RRR, breath sounds equal bilaterally, EKG without acute abnormalities, negative troponin, and negative CXR. No hypoxia, no tachycardia. Pt given GI cocktail in ED, pt had symptomatic improvement after intervention. CP likely related to GERD. Pt has been advised start a PPI and return to the ED if CP becomes exertional, associated with diaphoresis or nausea, radiates to left jaw/arm, worsens or becomes concerning in any way. Pt appears reliable for follow up and is agreeable to discharge.   Case has been discussed with  Dr. Laneta Simmers who agrees with the above plan to discharge.      Dondra Spry [place], PA-C 10/27/15 1815  Leo Grosser, MD 10/27/15 2138

## 2015-10-30 ENCOUNTER — Emergency Department (HOSPITAL_COMMUNITY): Payer: Medicaid Other

## 2015-10-30 ENCOUNTER — Encounter (HOSPITAL_COMMUNITY): Payer: Self-pay | Admitting: Emergency Medicine

## 2015-10-30 ENCOUNTER — Emergency Department (HOSPITAL_COMMUNITY)
Admission: EM | Admit: 2015-10-30 | Discharge: 2015-10-30 | Disposition: A | Discharge status: Home / Self Care | Payer: Medicaid Other | Attending: Emergency Medicine | Admitting: Emergency Medicine

## 2015-10-30 DIAGNOSIS — J441 Chronic obstructive pulmonary disease with (acute) exacerbation: Secondary | ICD-10-CM | POA: Diagnosis not present

## 2015-10-30 DIAGNOSIS — Z79899 Other long term (current) drug therapy: Secondary | ICD-10-CM | POA: Insufficient documentation

## 2015-10-30 DIAGNOSIS — F17210 Nicotine dependence, cigarettes, uncomplicated: Secondary | ICD-10-CM | POA: Insufficient documentation

## 2015-10-30 DIAGNOSIS — Z880 Allergy status to penicillin: Secondary | ICD-10-CM | POA: Insufficient documentation

## 2015-10-30 DIAGNOSIS — I10 Essential (primary) hypertension: Secondary | ICD-10-CM | POA: Diagnosis not present

## 2015-10-30 DIAGNOSIS — R0602 Shortness of breath: Secondary | ICD-10-CM | POA: Diagnosis present

## 2015-10-30 LAB — CBC WITH DIFFERENTIAL/PLATELET
Basophils Absolute: 0.1 10*3/uL (ref 0.0–0.1)
Basophils Relative: 0 %
Eosinophils Absolute: 0.3 10*3/uL (ref 0.0–0.7)
Eosinophils Relative: 2 %
HCT: 46.4 % (ref 39.0–52.0)
Hemoglobin: 15.7 g/dL (ref 13.0–17.0)
Lymphocytes Relative: 36 %
Lymphs Abs: 4.1 10*3/uL — ABNORMAL HIGH (ref 0.7–4.0)
MCH: 32.4 pg (ref 26.0–34.0)
MCHC: 33.8 g/dL (ref 30.0–36.0)
MCV: 95.9 fL (ref 78.0–100.0)
Monocytes Absolute: 1.1 10*3/uL — ABNORMAL HIGH (ref 0.1–1.0)
Monocytes Relative: 10 %
Neutro Abs: 5.8 10*3/uL (ref 1.7–7.7)
Neutrophils Relative %: 52 %
Platelets: 273 10*3/uL (ref 150–400)
RBC: 4.84 MIL/uL (ref 4.22–5.81)
RDW: 13.8 % (ref 11.5–15.5)
WBC: 11.4 10*3/uL — ABNORMAL HIGH (ref 4.0–10.5)

## 2015-10-30 LAB — COMPREHENSIVE METABOLIC PANEL
ALT: 15 U/L — ABNORMAL LOW (ref 17–63)
AST: 19 U/L (ref 15–41)
Albumin: 3.4 g/dL — ABNORMAL LOW (ref 3.5–5.0)
Alkaline Phosphatase: 63 U/L (ref 38–126)
Anion gap: 12 (ref 5–15)
BUN: 7 mg/dL (ref 6–20)
CO2: 23 mmol/L (ref 22–32)
Calcium: 8.9 mg/dL (ref 8.9–10.3)
Chloride: 106 mmol/L (ref 101–111)
Creatinine, Ser: 0.77 mg/dL (ref 0.61–1.24)
GFR calc Af Amer: >60 mL/min (ref >60)
GFR calc non Af Amer: >60 mL/min (ref >60)
Glucose, Bld: 87 mg/dL (ref 65–99)
Potassium: 3.4 mmol/L — ABNORMAL LOW (ref 3.5–5.1)
Sodium: 141 mmol/L (ref 135–145)
Total Bilirubin: 0.9 mg/dL (ref 0.3–1.2)
Total Protein: 6.6 g/dL (ref 6.5–8.1)

## 2015-10-30 LAB — BRAIN NATRIURETIC PEPTIDE: B Natriuretic Peptide: 19.7 pg/mL (ref 0.0–100.0)

## 2015-10-30 LAB — I-STAT TROPONIN, ED: Troponin i, poc: 0.01 ng/mL (ref 0.00–0.08)

## 2015-10-30 LAB — LIPASE, BLOOD: Lipase: 27 U/L (ref 11–51)

## 2015-10-30 IMAGING — CR DG CHEST 2V
2 series · 2 of 2 positions shown · non-contrast
Comparison: [DATE] and prior chest radiographs

CLINICAL DATA: 54-year-old male with acute shortness of breath and
cough.

EXAM:
CHEST  2 VIEW

[chest pa]
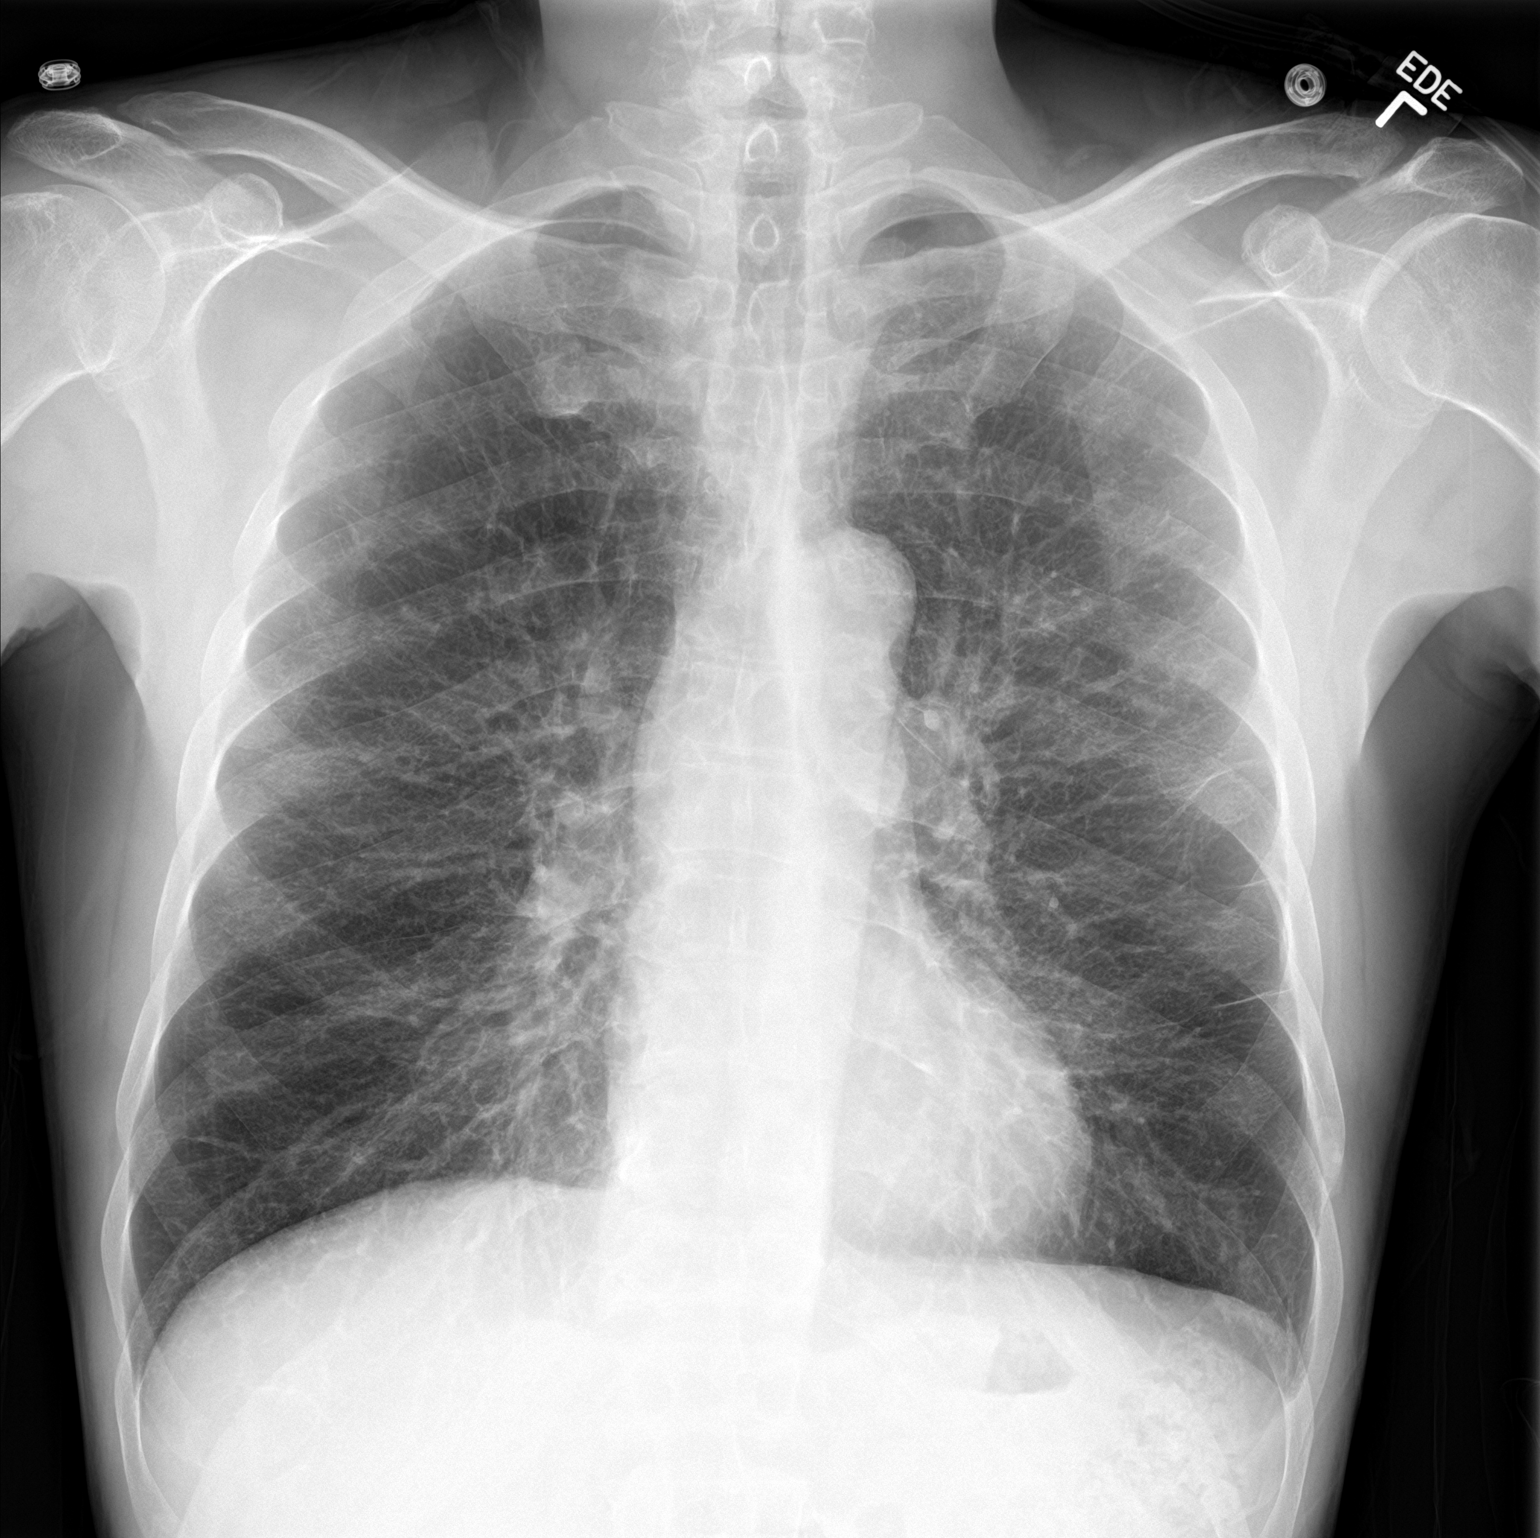

[chest lat]
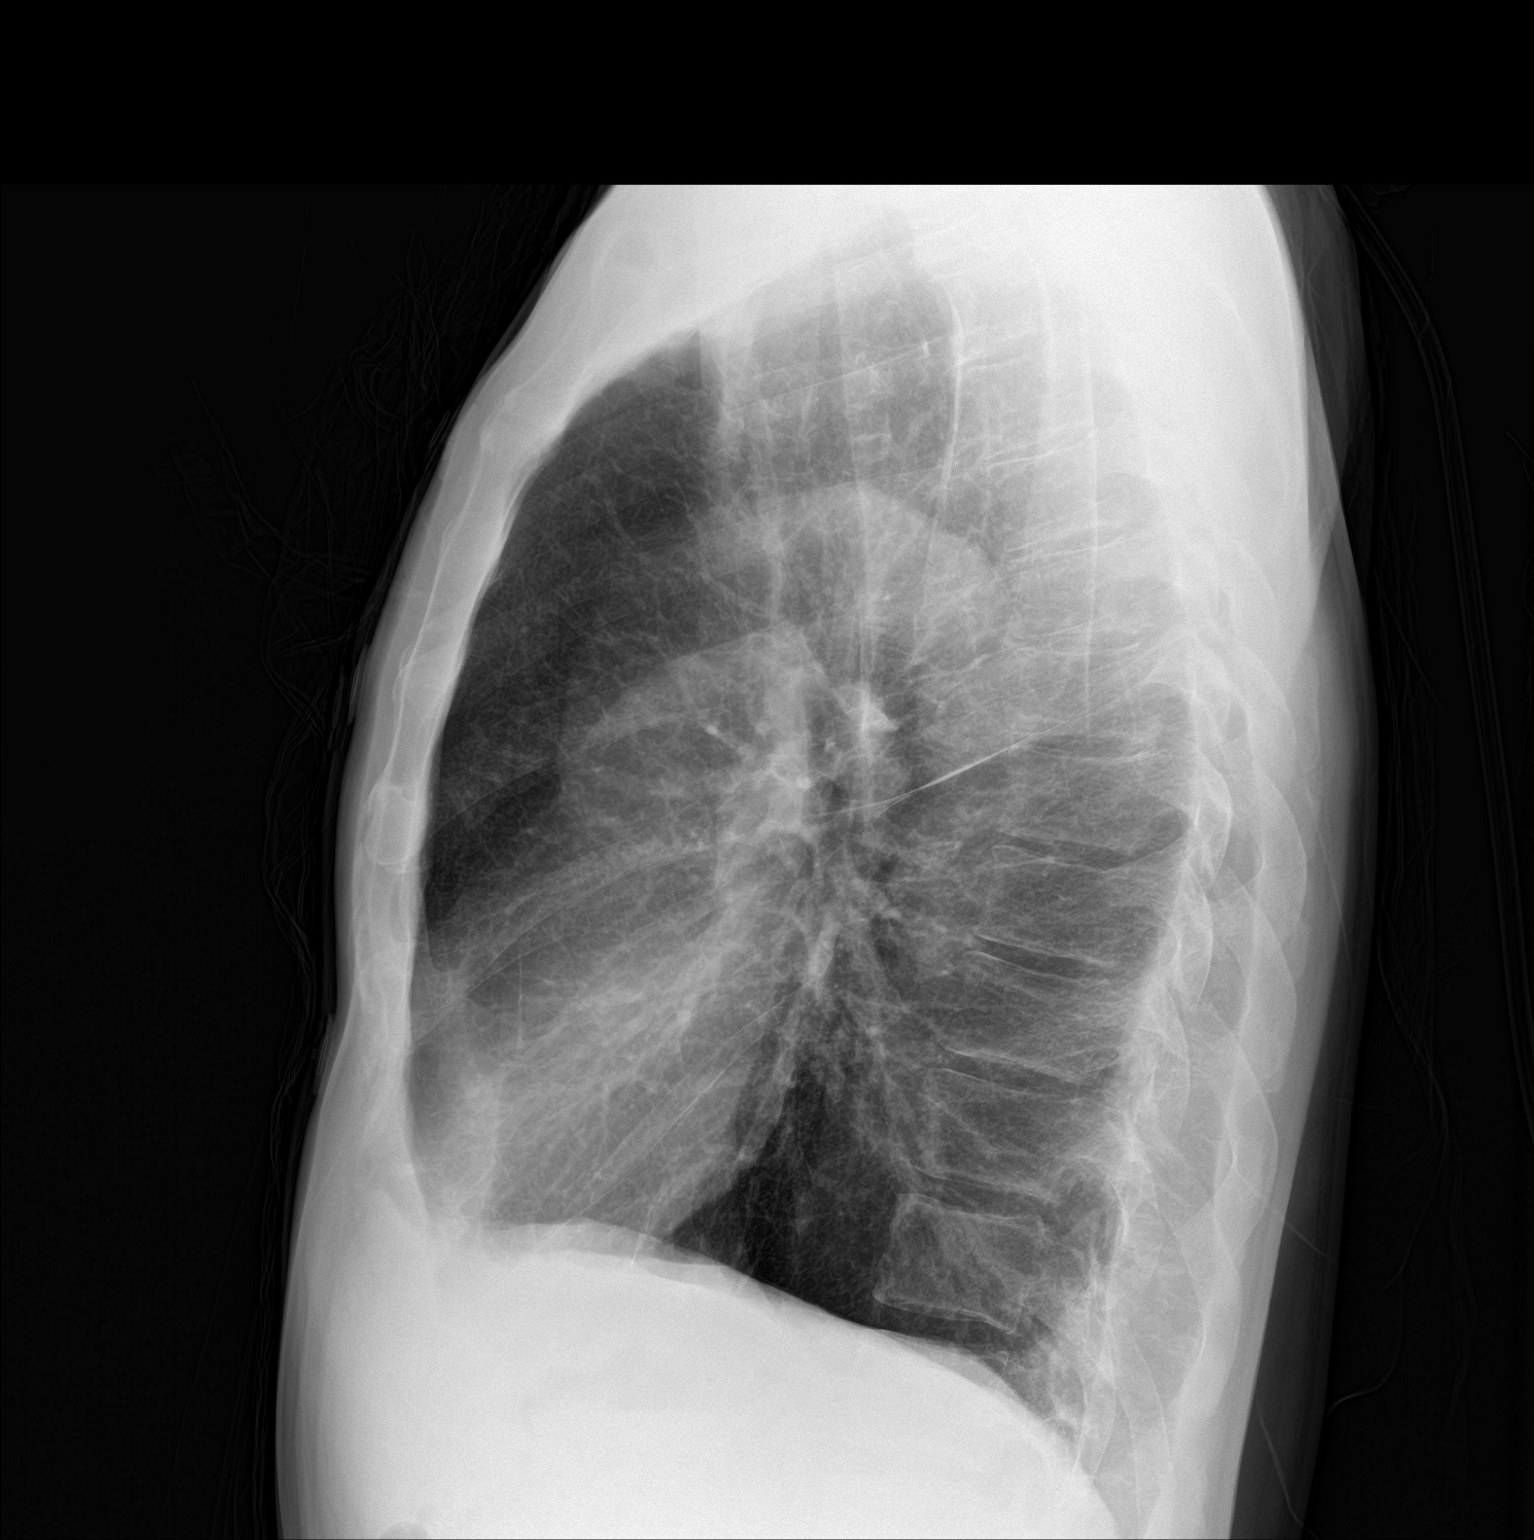

[2 of 2 positions shown; findings below may reference images not displayed]

FINDINGS: The cardiomediastinal silhouette is unremarkable.

COPD/emphysema changes are again identified.

There is no evidence of focal airspace disease, pulmonary edema,
suspicious pulmonary nodule/mass, pleural effusion, or pneumothorax.
No acute bony abnormalities are identified.
IMPRESSION: COPD/emphysema without evidence of acute cardiopulmonary disease.

## 2015-10-30 MED ORDER — BENZONATATE 100 MG PO CAPS: 100.0000 mg | ORAL_CAPSULE | Freq: Three times a day (TID) | ORAL | Status: DC

## 2015-10-30 MED ORDER — AZITHROMYCIN 250 MG PO TABS: 250.0000 mg | ORAL_TABLET | Freq: Every day | ORAL | Status: DC

## 2015-10-30 MED ORDER — DM-GUAIFENESIN ER 30-600 MG PO TB12
1.0000 | ORAL_TABLET | Freq: Two times a day (BID) | ORAL | Status: DC
  Administered 2015-10-30: 1 via ORAL
  Filled 2015-10-30: qty 1

## 2015-10-30 MED ORDER — GUAIFENESIN ER 600 MG PO TB12: 600.0000 mg | ORAL_TABLET | Freq: Two times a day (BID) | ORAL | Status: DC

## 2015-10-30 MED ORDER — IPRATROPIUM-ALBUTEROL 0.5-2.5 (3) MG/3ML IN SOLN
3.0000 mL | Freq: Once | RESPIRATORY_TRACT | Status: AC
  Administered 2015-10-30: 3 mL via RESPIRATORY_TRACT
  Filled 2015-10-30: qty 3

## 2015-10-30 MED ORDER — ALBUTEROL SULFATE (2.5 MG/3ML) 0.083% IN NEBU
2.5000 mg | INHALATION_SOLUTION | Freq: Once | RESPIRATORY_TRACT | Status: AC
Start: 2015-10-30 — End: 2015-10-30
  Administered 2015-10-30: 2.5 mg via RESPIRATORY_TRACT
  Filled 2015-10-30: qty 3

## 2015-10-30 NOTE — ED Notes (Signed)
Pt arrives via EMS from home with c/o sudden onset RLQ abdominal "tightness" starting while laying down this morning. Appears resolved now. Pt reports intermittent SHOB lately, cough with yellow sputum. Denies N/V/D. VSS

## 2015-10-30 NOTE — ED Notes (Signed)
Vital signs stable. 

## 2015-10-30 NOTE — ED Provider Notes (Signed)
CSN: [id number]     Arrival date & time 10/30/15  4580 History   First MD Initiated Contact with Patient 10/30/15 0600     Chief Complaint  Patient presents with  . Abdominal Pain  . Shortness of Breath     (Consider location/radiation/quality/duration/timing/severity/associated sxs/prior Treatment) HPI   Tony Sullivan is a 55 y.o. male, with a history of hypertension, COPD, and asthma, presenting to the ED with shortness of breath that came on suddenly about two hours ago when pt was at rest. Pt endorses increased dry coughing for the last two days. Pt used his albuterol nebulizer, which helped some. Pt also usually wears 2-3 L O2 at night and had to turn it up to 4 LPM this morning. Pt also complains of epigastric discomfort that "feels like a fullness like I overate," rates it 2/10, worse with movement, non-radiating. Pt endorses nasal and sinus congestion for the last week. Pt denies chest pain, N/V/C/D, urinary complaints, fever/chills, orthopnea, or any other pain or complaints.   Past Medical History  Diagnosis Date  . High blood pressure   . COPD (chronic obstructive pulmonary disease) ([place]) 2004    diagnosed in 2004, no PFT's to date.  Started on home O2 12/2013, after found to be desatting at PCP's office, and referred to pulmonology.  . Seasonal allergies   . Asthma    Past Surgical History  Procedure Laterality Date  . Appendectomy  1980  . Hip surgery Right   . Shoulder arthroscopy Right    Family History  Problem Relation Age of Onset  . Emphysema Mother   . Emphysema Father   . Allergies Mother     "everyone in family"  . Allergies Father   . Allergies Son   . Asthma Mother     "everyone in family"  . Heart disease Mother   . Heart disease Maternal Aunt   . Clotting disorder Mother   . Cancer Mother    Social History  Substance Use Topics  . Smoking status: Current Every Day Smoker -- 0.10 packs/day for 36 years    Types: Cigarettes  . Smokeless tobacco:  Never Used     Comment: Was up to 2 ppd, down to 5 cigs/day.  . Alcohol Use: 8.4 - 12.6 oz/week    14-21 Standard drinks or equivalent per week     Comment: 2-3 beers/day, with occasional "binges".  No history of withdrawal    Review of Systems  Constitutional: Negative for fever, chills and diaphoresis.  Respiratory: Positive for cough and shortness of breath.   Cardiovascular: Negative for chest pain.  Gastrointestinal: Positive for abdominal pain. Negative for nausea, vomiting, diarrhea, constipation and blood in stool.  Genitourinary: Negative for dysuria.  Neurological: Negative for dizziness, light-headedness and headaches.  All other systems reviewed and are negative.     Allergies  Penicillins and Sulfa antibiotics  Home Medications   Prior to Admission medications   Medication Sig Start Date End Date Taking? Authorizing Provider  Aclidinium Bromide (TUDORZA PRESSAIR) 400 MCG/ACT AEPB Inhale 1 puff into the lungs 2 (two) times daily. 04/05/15  Yes Tammy S Parrett, NP  albuterol (PROAIR HFA) 108 (90 BASE) MCG/ACT inhaler 2 puffs up to every 4 hours if needed for short of breath 07/31/15  Yes [name], MD  albuterol (PROVENTIL) (2.5 MG/3ML) 0.083% nebulizer solution Take 2.5 mg by nebulization every 4 (four) hours as needed for wheezing or shortness of breath.    Yes Historical Provider,  MD  amLODipine (NORVASC) 10 MG tablet Take 10 mg by mouth every morning.    Yes Historical Provider, MD  benztropine (COGENTIN) 0.5 MG tablet Take 0.5 mg by mouth at bedtime.    Yes Historical Provider, MD  budesonide-formoterol (SYMBICORT) 160-4.5 MCG/ACT inhaler Inhale 2 puffs into the lungs 2 (two) times daily. 04/05/15  Yes Tammy S Parrett, NP  cetirizine (ZYRTEC) 10 MG tablet Take 1 tablet (10 mg total) by mouth daily. Patient taking differently: Take 10 mg by mouth at bedtime.  09/15/14  Yes [name], MD  loxapine (LOXITANE) 10 MG capsule Take 10 mg by mouth at bedtime.    Yes  Historical Provider, MD  meloxicam (MOBIC) 7.5 MG tablet Take 7.5 mg by mouth daily as needed for pain.    Yes Historical Provider, MD  mirtazapine (REMERON) 30 MG tablet Take 30 mg by mouth at bedtime.   Yes Historical Provider, MD  omeprazole (PRILOSEC) 40 MG capsule Take 1 capsule (40 mg total) by mouth daily. 09/15/14  Yes [name], MD  pantoprazole (PROTONIX) 20 MG tablet Take 1 tablet (20 mg total) by mouth daily. 10/25/15  Yes Samantha Tripp [place], PA-C  risperiDONE (RISPERDAL) 2 MG tablet Take 2 mg by mouth at bedtime.   Yes Historical Provider, MD  valsartan (DIOVAN) 160 MG tablet Take 1 tablet (160 mg total) by mouth daily. 08/30/14  Yes [name], MD  azithromycin (ZITHROMAX Z-PAK) 250 MG tablet Take 1 tablet (250 mg total) by mouth daily. 10/30/15   Kingdom Vanzanten C Emmalina Espericueta, PA-C  benzonatate (TESSALON) 100 MG capsule Take 1 capsule (100 mg total) by mouth every 8 (eight) hours. 10/30/15   [place] Paiz C Robey Massmann, PA-C  guaiFENesin (MUCINEX) 600 MG 12 hr tablet Take 1 tablet (600 mg total) by mouth 2 (two) times daily. 10/30/15   Benard Minturn C Treyton Slimp, PA-C  OXYGEN Inhale 3-4 L into the lungs continuous. Uses 3L at bedtime and 3 to 4 L's while walking    Historical Provider, MD   BP 129/77 mmHg  Pulse 72  Temp(Src) 97.7 F (36.5 C) (Oral)  Resp 20  SpO2 94% Physical Exam  Constitutional: He appears well-developed and well-nourished. No distress.  HENT:  Head: Normocephalic and atraumatic.  Eyes: Conjunctivae are normal. Pupils are equal, round, and reactive to light.  Cardiovascular: Normal rate, regular rhythm and normal heart sounds.   Pulmonary/Chest: Effort normal and breath sounds normal. No respiratory distress.  Abdominal: Soft. Bowel sounds are normal.  Musculoskeletal: He exhibits no edema or tenderness.  Neurological: He is alert.  Skin: Skin is warm and dry. He is not diaphoretic.  Nursing note and vitals reviewed.   ED Course  Procedures (including critical care time) Labs Review Labs  Reviewed  CBC WITH DIFFERENTIAL/PLATELET - Abnormal; Notable for the following:    WBC 11.4 (*)    Lymphs Abs 4.1 (*)    Monocytes Absolute 1.1 (*)    All other components within normal limits  COMPREHENSIVE METABOLIC PANEL - Abnormal; Notable for the following:    Potassium 3.4 (*)    Albumin 3.4 (*)    ALT 15 (*)    All other components within normal limits  BRAIN NATRIURETIC PEPTIDE  LIPASE, BLOOD  I-STAT TROPOININ, ED    Imaging Review Dg Chest 2 View  10/30/2015  CLINICAL DATA:  55 year old male with acute shortness of breath and cough. EXAM: CHEST  2 VIEW COMPARISON:  10/25/2015 and prior chest radiographs FINDINGS: The cardiomediastinal silhouette is  unremarkable. COPD/emphysema changes are again identified. There is no evidence of focal airspace disease, pulmonary edema, suspicious pulmonary nodule/mass, pleural effusion, or pneumothorax. No acute bony abnormalities are identified. IMPRESSION: COPD/emphysema without evidence of acute cardiopulmonary disease. Electronically Signed   By: Margarette Canada M.D.   On: 10/30/2015 07:28   I have personally reviewed and evaluated these images and lab results as part of my medical decision-making.   EKG Interpretation None      MDM   Final diagnoses:  COPD exacerbation ([place])  Shortness of breath    Cooper Render Isaza presents with shortness of breath  Findings and plan of care discussed with Julianne Rice, MD.  This patient's presentation is consistent with a COPD exacerbation. Patient is nontoxic appearing, in no apparent distress, is not tachycardic or tachypneic, is afebrile, and maintains an excellent SPO2 on his home oxygen. EKG shows sinus rhythm with no ectopics. 7:55 AM patient was reassessed and states that his shortness of breath is gone, his nasal congestion head is completely relieved, and he no longer has epigastric pain. Patient experienced a complete relief of symptoms while here in the ED with conservative management.  The patient was given instructions for home care as well as return precautions. Patient voices understanding of these instructions, accepts the plan, and is comfortable with discharge.  Filed Vitals:   10/30/15 0600 10/30/15 0630 10/30/15 0649 10/30/15 0700  BP: 132/87 136/95 129/82 129/77  Pulse: 74 79 75 72  Temp:      TempSrc:      Resp:   20 20  SpO2: 97% 97% 99% 94%     Lorayne Bender, PA-C 10/30/15 1214  Julianne Rice, MD 11/01/15 1654

## 2015-10-30 NOTE — Discharge Instructions (Signed)
You have been seen today for cough and shortness of breath. Your imaging and lab tests showed no significant abnormalities. Follow up with PCP as needed for chronic management. Return to ED should symptoms worsen. Please take all of your antibiotics until finished!   You may develop abdominal discomfort or diarrhea from the antibiotic.  You may help offset this with probiotics which you can buy or get in yogurt. Do not eat or take the probiotics until 2 hours after your antibiotic.

## 2015-10-30 NOTE — ED Notes (Signed)
Pt wears 2-3L o2 at night, states he cranked it up this morning, yelled at this RN to "crank that thing up."

## 2015-10-31 ENCOUNTER — Ambulatory Visit: Payer: Medicaid Other | Admitting: Internal Medicine

## 2015-11-02 ENCOUNTER — Ambulatory Visit: Payer: Medicaid Other | Admitting: Internal Medicine

## 2015-11-13 ENCOUNTER — Telehealth: Payer: Self-pay | Admitting: Internal Medicine

## 2015-11-13 MED ORDER — ACLIDINIUM BROMIDE 400 MCG/ACT IN AEPB: 1.0000 | INHALATION_SPRAY | Freq: Two times a day (BID) | RESPIRATORY_TRACT | Status: DC

## 2015-11-13 NOTE — Telephone Encounter (Signed)
Spoke with pt. Needs refill on Tudorza. Rx has been sent in. Nothing further was needed.

## 2015-11-27 ENCOUNTER — Ambulatory Visit: Payer: Medicaid Other | Admitting: Internal Medicine

## 2015-12-04 ENCOUNTER — Emergency Department (HOSPITAL_COMMUNITY): Payer: Medicaid Other

## 2015-12-04 ENCOUNTER — Telehealth: Payer: Self-pay | Admitting: Internal Medicine

## 2015-12-04 ENCOUNTER — Encounter (HOSPITAL_COMMUNITY): Payer: Self-pay | Admitting: *Deleted

## 2015-12-04 ENCOUNTER — Emergency Department (HOSPITAL_COMMUNITY)
Admission: EM | Admit: 2015-12-04 | Discharge: 2015-12-04 | Disposition: A | Discharge status: Home / Self Care | Payer: Medicaid Other | Attending: Emergency Medicine | Admitting: Emergency Medicine

## 2015-12-04 DIAGNOSIS — J449 Chronic obstructive pulmonary disease, unspecified: Secondary | ICD-10-CM | POA: Diagnosis not present

## 2015-12-04 DIAGNOSIS — Z792 Long term (current) use of antibiotics: Secondary | ICD-10-CM | POA: Insufficient documentation

## 2015-12-04 DIAGNOSIS — R079 Chest pain, unspecified: Secondary | ICD-10-CM | POA: Diagnosis present

## 2015-12-04 DIAGNOSIS — R1013 Epigastric pain: Secondary | ICD-10-CM | POA: Insufficient documentation

## 2015-12-04 DIAGNOSIS — Z880 Allergy status to penicillin: Secondary | ICD-10-CM | POA: Diagnosis not present

## 2015-12-04 DIAGNOSIS — Z79899 Other long term (current) drug therapy: Secondary | ICD-10-CM | POA: Insufficient documentation

## 2015-12-04 DIAGNOSIS — F17210 Nicotine dependence, cigarettes, uncomplicated: Secondary | ICD-10-CM | POA: Insufficient documentation

## 2015-12-04 LAB — BASIC METABOLIC PANEL
Anion gap: 11 (ref 5–15)
BUN: 16 mg/dL (ref 6–20)
CO2: 23 mmol/L (ref 22–32)
Calcium: 9.3 mg/dL (ref 8.9–10.3)
Chloride: 110 mmol/L (ref 101–111)
Creatinine, Ser: 1.07 mg/dL (ref 0.61–1.24)
GFR calc Af Amer: >60 mL/min (ref >60)
GFR calc non Af Amer: >60 mL/min (ref >60)
Glucose, Bld: 88 mg/dL (ref 65–99)
Potassium: 3.7 mmol/L (ref 3.5–5.1)
Sodium: 144 mmol/L (ref 135–145)

## 2015-12-04 LAB — CBC
HCT: 48.2 % (ref 39.0–52.0)
Hemoglobin: 16.6 g/dL (ref 13.0–17.0)
MCH: 33.1 pg (ref 26.0–34.0)
MCHC: 34.4 g/dL (ref 30.0–36.0)
MCV: 96.2 fL (ref 78.0–100.0)
Platelets: 301 10*3/uL (ref 150–400)
RBC: 5.01 MIL/uL (ref 4.22–5.81)
RDW: 14.3 % (ref 11.5–15.5)
WBC: 11.6 10*3/uL — ABNORMAL HIGH (ref 4.0–10.5)

## 2015-12-04 LAB — LIPASE, BLOOD: Lipase: 27 U/L (ref 11–51)

## 2015-12-04 LAB — I-STAT TROPONIN, ED
Troponin i, poc: 0 ng/mL (ref 0.00–0.08)
Troponin i, poc: 0.01 ng/mL (ref 0.00–0.08)

## 2015-12-04 IMAGING — DX DG CHEST 2V
2 series · 2 of 2 positions shown · non-contrast
Comparison: [DATE]

CLINICAL DATA: Chest pain for 1 week

EXAM:
CHEST  2 VIEW

[w chest pa]
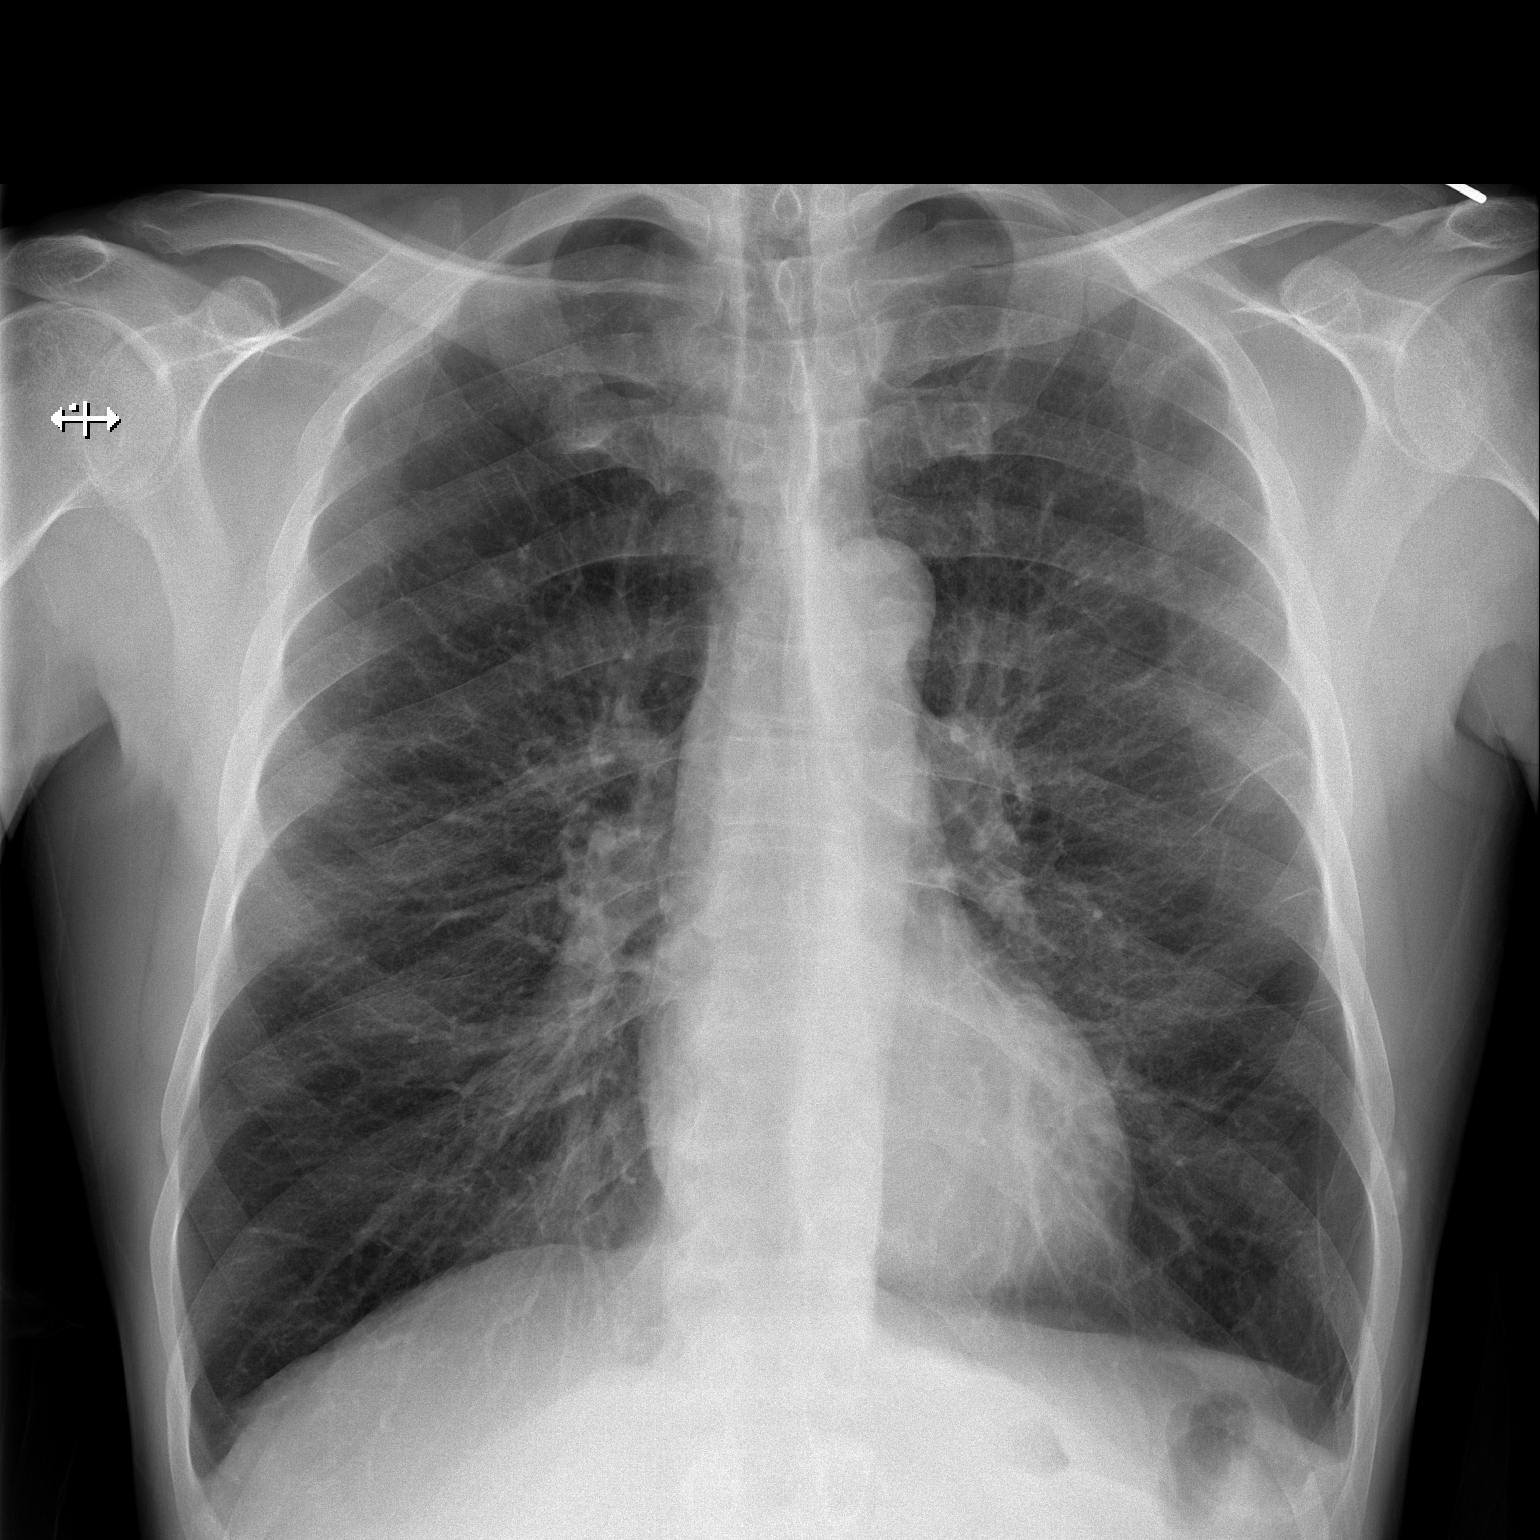

[w chest lat]
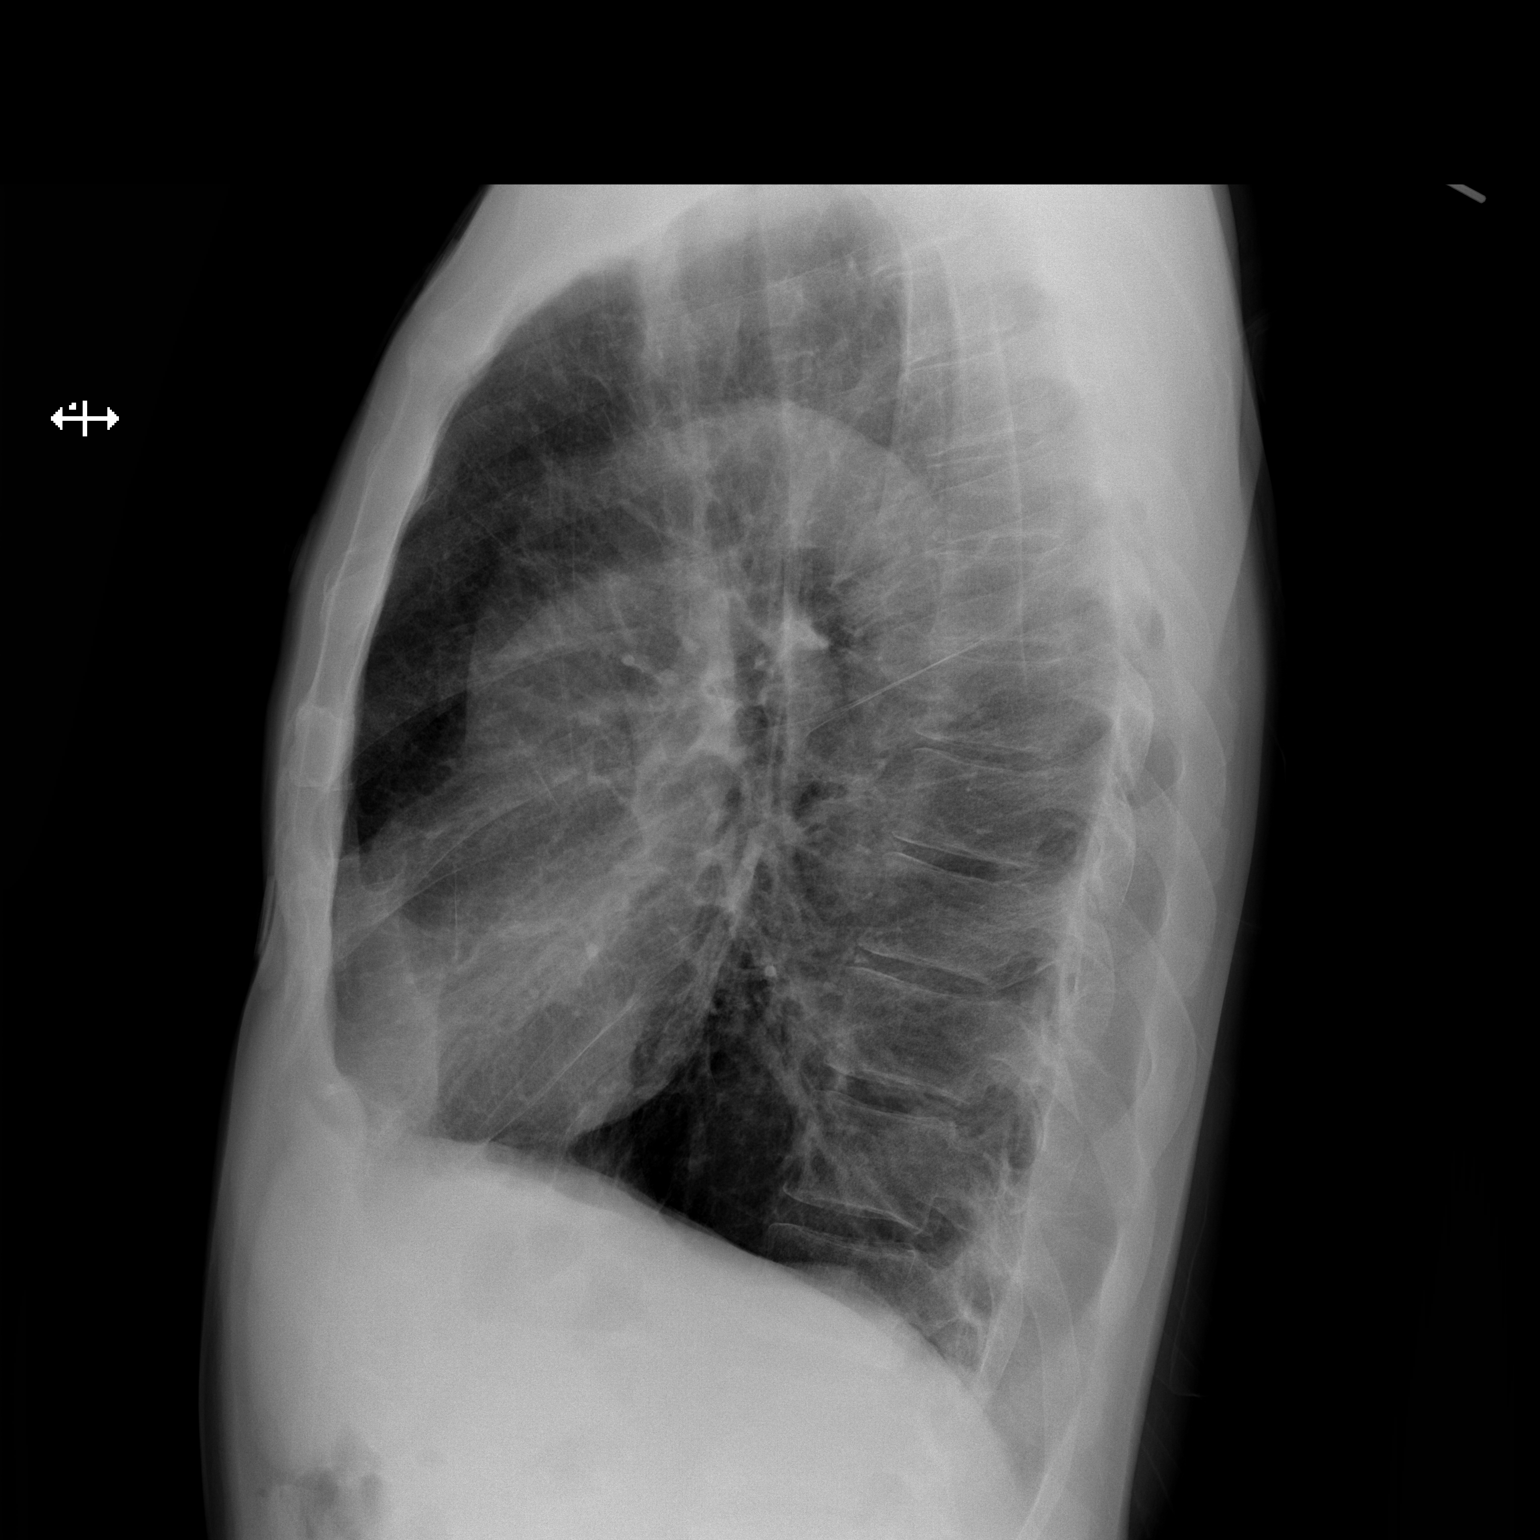

[2 of 2 positions shown; findings below may reference images not displayed]

FINDINGS: No pneumothorax. Linear opacities in the lateral left chest are
stable, likely scarring. A vertically oriented linear density over
the left lung base with lung markings on both sides is probably
something on the patient. No pneumothorax identified on today's
study. The cardiomediastinal silhouette is stable. No focal
infiltrate, nodule, or mass. Hyperinflation of the lungs again seen.
IMPRESSION: No active cardiopulmonary disease.

## 2015-12-04 MED ORDER — FAMOTIDINE IN NACL 20-0.9 MG/50ML-% IV SOLN
20.0000 mg | Freq: Once | INTRAVENOUS | Status: AC
  Administered 2015-12-04: 20 mg via INTRAVENOUS
  Filled 2015-12-04: qty 50

## 2015-12-04 MED ORDER — GI COCKTAIL ~~LOC~~
30.0000 mL | Freq: Once | ORAL | Status: AC
  Administered 2015-12-04: 30 mL via ORAL
  Filled 2015-12-04: qty 30

## 2015-12-04 MED ORDER — FAMOTIDINE 20 MG PO TABS: 20.0000 mg | ORAL_TABLET | Freq: Two times a day (BID) | ORAL | Status: DC

## 2015-12-04 MED ORDER — PANTOPRAZOLE SODIUM 40 MG IV SOLR
40.0000 mg | Freq: Once | INTRAVENOUS | Status: AC
  Administered 2015-12-04: 40 mg via INTRAVENOUS
  Filled 2015-12-04: qty 40

## 2015-12-04 MED ORDER — SODIUM CHLORIDE 0.9 % IV BOLUS (SEPSIS)
1000.0000 mL | Freq: Once | INTRAVENOUS | Status: AC
  Administered 2015-12-04: 1000 mL via INTRAVENOUS

## 2015-12-04 NOTE — Telephone Encounter (Signed)
Nothing to offer over the phone so go back to ER if can't wait until ov (next avail/ don't overbook)   Instruct When return bring your medications in 2 separate bags, the ones you take no matter(automatically)  what vs the as needed (only when you feel you need them)

## 2015-12-04 NOTE — ED Notes (Signed)
Pt reports intermittent chest pains for months, mid chest pain. Reports being seen for same in past. Reports sob and fatigue/dizziness.  hx of copd. ekg done on arrival.

## 2015-12-04 NOTE — Telephone Encounter (Signed)
Pt states that he has been to ED several times for the chest pain and they send him back home after eval stating that nothing is wrong.  Pt describes the pain as stinging and hurts constantly. Reports this started a few months ago.  Pt c/o cough with clear/white mucus production, wheezing and SOB. Using all meds as directed.  Pt notes some increased amounts of stress - not sure if this could be stress related or related to his COPD. Seen In ED most recently for these symptoms- 10/30/15 Last seen 07/2015 by MW  Patient Instructions     Prevnar 13 and flu shots today  Plan A = Automatic = symbicort 160 Take 2 puffs first thing in am and then another 2 puffs about 12 hours later and tudorza one twice daily as well  Plan B = Backup- Proair use it only if you can't your breath - ok to use up to 2 pffs every 4 hours if needed  Plan C = Nebulizer up to every 4 hours if needed only if you try plan B first and it doesn't work  The key is to stop smoking completely before smoking completely stops you!  No need for 02 at bedtime or with nl activities, use 3lpm with heavy exertion as needed   Please see patient coordinator before you leave today to schedule pulmonary rehab   Please schedule a follow up visit in 3 months but call sooner if needed    Pt states that he is going to the ED by 10am if not heard back from [place].  Please advise Dr [name]. Thanks.

## 2015-12-04 NOTE — ED Notes (Signed)
Patient provided ginger ale.

## 2015-12-04 NOTE — Telephone Encounter (Signed)
Pt schedule for OV 12/05/15 at 845 with Dr [name].   Aware per MW to bring all meds in hand to appt.  Pt advised that if symptoms worsen to seek care at ED.  Nothing further needed.

## 2015-12-04 NOTE — ED Notes (Signed)
PA Riley at bedside.

## 2015-12-04 NOTE — ED Provider Notes (Signed)
CSN: [id number]     Arrival date & time 12/04/15  1017 History   First MD Initiated Contact with Patient 12/04/15 1312     Chief Complaint  Patient presents with  . Chest Pain     (Consider location/radiation/quality/duration/timing/severity/associated sxs/prior Treatment) HPI   Tony Sullivan is a 55 y.o. male, with a history of COPD and asthma, presenting to the ED with chest discomfort that began a week ago. Pt states he had this same pain a month ago, was seen here for it, and then the pain resolved. Pt rates his pain 6/10, burning in nature, radiating up under his sternum and down to the epigastric region. Nothing make the pain worse, but sometimes gets worse with eating or lying down. Pain is intermittent. Pt denies N/V/C/D, hematemesis, melena or hematochezia, fever/chills, or any other complaints.     Past Medical History  Diagnosis Date  . High blood pressure   . COPD (chronic obstructive pulmonary disease) ([place]) 2004    diagnosed in 2004, no PFT's to date.  Started on home O2 12/2013, after found to be desatting at PCP's office, and referred to pulmonology.  . Seasonal allergies   . Asthma    Past Surgical History  Procedure Laterality Date  . Appendectomy  1980  . Hip surgery Right   . Shoulder arthroscopy Right    Family History  Problem Relation Age of Onset  . Emphysema Mother   . Emphysema Father   . Allergies Mother     "everyone in family"  . Allergies Father   . Allergies Son   . Asthma Mother     "everyone in family"  . Heart disease Mother   . Heart disease Maternal Aunt   . Clotting disorder Mother   . Cancer Mother    Social History  Substance Use Topics  . Smoking status: Current Every Day Smoker -- 0.10 packs/day for 36 years    Types: Cigarettes  . Smokeless tobacco: Never Used     Comment: Was up to 2 ppd, down to 5 cigs/day.  . Alcohol Use: 8.4 - 12.6 oz/week    14-21 Standard drinks or equivalent per week     Comment: 2-3 beers/day, with  occasional "binges".  No history of withdrawal    Review of Systems  Constitutional: Negative for fever, chills and diaphoresis.  Respiratory: Negative for shortness of breath.   Cardiovascular: Positive for chest pain.  Gastrointestinal: Positive for abdominal pain (Epigastric). Negative for nausea, vomiting, diarrhea, constipation and blood in stool.  Genitourinary: Negative for dysuria.  Musculoskeletal: Negative for back pain.  Skin: Negative for color change and pallor.  All other systems reviewed and are negative.     Allergies  Penicillins and Sulfa antibiotics  Home Medications   Prior to Admission medications   Medication Sig Start Date End Date Taking? Authorizing Provider  Aclidinium Bromide (TUDORZA PRESSAIR) 400 MCG/ACT AEPB Inhale 1 puff into the lungs 2 (two) times daily. 11/13/15  Yes [name], MD  albuterol [facility] HFA) 108 (90 BASE) MCG/ACT inhaler 2 puffs up to every 4 hours if needed for short of breath 07/31/15  Yes [name], MD  albuterol (PROVENTIL) (2.5 MG/3ML) 0.083% nebulizer solution Take 2.5 mg by nebulization every 4 (four) hours as needed for wheezing or shortness of breath.    Yes Historical Provider, MD  amLODipine (NORVASC) 10 MG tablet Take 10 mg by mouth every morning.    Yes Historical Provider, MD  benztropine (COGENTIN)  0.5 MG tablet Take 0.5 mg by mouth at bedtime.    Yes Historical Provider, MD  budesonide-formoterol (SYMBICORT) 160-4.5 MCG/ACT inhaler Inhale 2 puffs into the lungs 2 (two) times daily. 04/05/15  Yes Tammy S Parrett, NP  cetirizine (ZYRTEC) 10 MG tablet Take 1 tablet (10 mg total) by mouth daily. Patient taking differently: Take 10 mg by mouth at bedtime.  09/15/14  Yes [name], MD  doxycycline (VIBRA-TABS) 100 MG tablet Take 100 mg by mouth 2 (two) times daily.   Yes Historical Provider, MD  guaiFENesin (MUCINEX) 600 MG 12 hr tablet Take 1 tablet (600 mg total) by mouth 2 (two) times daily. 10/30/15  Yes Vonette Grosso C  Rashida Ladouceur, PA-C  loxapine (LOXITANE) 10 MG capsule Take 10 mg by mouth at bedtime.    Yes Historical Provider, MD  meloxicam (MOBIC) 7.5 MG tablet Take 7.5 mg by mouth daily as needed for pain.    Yes Historical Provider, MD  mirtazapine (REMERON) 30 MG tablet Take 30 mg by mouth at bedtime.   Yes Historical Provider, MD  omeprazole (PRILOSEC) 40 MG capsule Take 1 capsule (40 mg total) by mouth daily. 09/15/14  Yes [name], MD  OXYGEN Inhale 3-4 L into the lungs continuous. Uses 3L at bedtime and 3 to 4 L's while walking   Yes Historical Provider, MD  pantoprazole (PROTONIX) 20 MG tablet Take 1 tablet (20 mg total) by mouth daily. 10/25/15  Yes Samantha Tripp Dowless, PA-C  risperiDONE (RISPERDAL) 2 MG tablet Take 2 mg by mouth at bedtime.   Yes Historical Provider, MD  valsartan (DIOVAN) 160 MG tablet Take 1 tablet (160 mg total) by mouth daily. 08/30/14  Yes [name], MD  benzonatate (TESSALON) 100 MG capsule Take 1 capsule (100 mg total) by mouth every 8 (eight) hours. Patient not taking: Reported on 12/04/2015 10/30/15   Birttany Dechellis C Ashantia Amaral, PA-C   BP 122/86 mmHg  Pulse 71  Temp(Src) 97.7 F (36.5 C) (Oral)  Resp 18  SpO2 96% Physical Exam  Constitutional: He appears well-developed and well-nourished. No distress.  HENT:  Head: Normocephalic and atraumatic.  Eyes: Conjunctivae are normal. Pupils are equal, round, and reactive to light.  Neck: Neck supple.  Cardiovascular: Normal rate, regular rhythm, normal heart sounds and intact distal pulses.   Pulmonary/Chest: Effort normal and breath sounds normal. No respiratory distress. He exhibits tenderness (Left chest and sternum).  Abdominal: Soft. Bowel sounds are normal. There is tenderness in the epigastric area. There is no guarding.  Musculoskeletal: He exhibits no edema or tenderness.  Lymphadenopathy:    He has no cervical adenopathy.  Neurological: He is alert.  Skin: Skin is warm and dry. He is not diaphoretic.  Nursing note and  vitals reviewed.   ED Course  Procedures (including critical care time) Labs Review Labs Reviewed  CBC - Abnormal; Notable for the following:    WBC 11.6 (*)    All other components within normal limits  BASIC METABOLIC PANEL  LIPASE, BLOOD  I-STAT TROPOININ, ED  Tony Sullivan, ED    Imaging Review Dg Chest 2 View  12/04/2015  CLINICAL DATA:  Chest pain for 1 week EXAM: CHEST  2 VIEW COMPARISON:  October 30, 2015 FINDINGS: No pneumothorax. Linear opacities in the lateral left chest are stable, likely scarring. A vertically oriented linear density over the left lung base with lung markings on both sides is probably something on the patient. No pneumothorax identified on today's study. The cardiomediastinal silhouette  is stable. No focal infiltrate, nodule, or mass. Hyperinflation of the lungs again seen. IMPRESSION: No active cardiopulmonary disease. Electronically Signed   By: Dorise Bullion III M.D   On: 12/04/2015 11:26   I have personally reviewed and evaluated these images and lab results as part of my medical decision-making.   EKG Interpretation None      MDM   Final diagnoses:  Chest pain, unspecified chest pain type  Epigastric pain    Tony Sullivan presents with intermittent burning chest pain that has been going on for about a week.  Findings and plan of care discussed with Tony Manifold, MD.  Do not suspect ACS. HEART score is 1, indicating low risk for a cardiac event. Wells criteria score is 0, indicating low risk for PE.  EKG shows no changes from previous. Chest x-ray is without acute abnormality. Patient is nontoxic appearing, afebrile, not tachycardic, not tachypneic, maintains SPO2 of 96-97% on room air, and is in no apparent distress. Patient has no signs of sepsis or other serious or life-threatening condition. Patient given Pepcid, Protonix, and GI cocktail. Upon reassessment, patient voices relief of pain with conservative management here in the ED.  Suspect that this patient's pain may be related to GERD vs an inflammatory process such as costochondritis. Patient's status and vitals remained stable. Pt was advised to follow-up with his PCP as soon as possible. Patient was also told to have his PCP schedule a possible stress test if he is still worried about his pain. Serial troponins obtained.  3:56 PM End of shift patient care handoff report given to Aldona [place]) Ovid Curd, [place].  Plan: If second troponin is negative, discharge patient. If positive, admit for chest pain work up.   Filed Vitals:   12/04/15 1227 12/04/15 1230 12/04/15 1245 12/04/15 1300  BP: 132/84 117/92 127/87 122/86  Pulse: 77 79 70 71  Temp:      TempSrc:      Resp: '12 16 15 18  '$ SpO2: 96% 96% 97% 96%   Filed Vitals:   12/04/15 1300 12/04/15 1445 12/04/15 1500 12/04/15 1515  BP: 122/86 134/90 100/86 127/92  Pulse: 71 70 72 66  Temp:      TempSrc:      Resp: 18 16    SpO2: 96% 96% 95% 95%      Lorayne Bender, PA-C 12/04/15 1604  Tony Manifold, MD 12/05/15 [phone]

## 2015-12-04 NOTE — Discharge Instructions (Signed)
You have been seen today for chest and epigastric pain. Your imaging and lab tests showed no abnormalities. Follow up with PCP as soon as possible for chronic management of this issue for a possible stress test, if they deem it necessary. Return to ED should symptoms worsen.

## 2015-12-04 NOTE — ED Provider Notes (Signed)
Care hand off at shift change from [facility], [place]. 2nd troponin pending. If negative, can go home.   Second troponin negative. Patient may be safely discharged home. Discussed return precautions.   [place], PA-C 12/04/15 [place], MD 12/05/15 [phone]

## 2015-12-04 NOTE — Telephone Encounter (Deleted)
The pt is aware of these instructions per Ashtyn. Nothing further was needed.

## 2015-12-05 ENCOUNTER — Ambulatory Visit (INDEPENDENT_AMBULATORY_CARE_PROVIDER_SITE_OTHER): Payer: Medicaid Other | Admitting: Internal Medicine

## 2015-12-05 ENCOUNTER — Encounter: Payer: Self-pay | Admitting: Internal Medicine

## 2015-12-05 VITALS — BP 144/82 | HR 80 | Ht 71.0 in | Wt 161.0 lb

## 2015-12-05 DIAGNOSIS — J449 Chronic obstructive pulmonary disease, unspecified: Secondary | ICD-10-CM

## 2015-12-05 DIAGNOSIS — R0902 Hypoxemia: Secondary | ICD-10-CM | POA: Diagnosis not present

## 2015-12-05 DIAGNOSIS — R0789 Other chest pain: Secondary | ICD-10-CM | POA: Diagnosis not present

## 2015-12-05 NOTE — Assessment & Plan Note (Addendum)
-   02/14/2014   try symbicort 160 2bid  - PFT's  02/14/2014   FEV1 1.82 (53%) and ratio 47 no better p alb (p spiriva and symbicort 160)  and DLCO 32 with 37%  07/2014 >Spiriva stopped  09/15/2014 Med calendar> not using 02/20/2015   - 02/20/2015 p extensive coaching HFA effectiveness =    90%   He is better since quit smoking but having multiple atypical issues with anxiety causing him to overuse saba and cp's ? Etiology  For now no change in rx needed  I had an extended discussion with the patient reviewing all relevant studies completed to date and  lasting 15 to 20 minutes of a 25 minute visit on the following ongoing concerns:   Reinforced:   I reviewed the Fletcher curve with the patient that basically indicates  if you quit smoking when your best day FEV1 is still well preserved (as is relatively   the case here)  it is highly unlikely you will progress to severe disease and informed the patient there was no medication on the market that has proven to alter the curve/ its downward trajectory  or the likelihood of progression of their disease.  Therefore stopping smoking and maintaining abstinence is the most important aspect of his care, not choice of inhalers or for that matter, doctors.

## 2015-12-05 NOTE — Assessment & Plan Note (Signed)
-   started on 27 December 2013 by [facility] clinic - 02/14/2014  Walked RA x 3/4  laps @ 185 ft each stopped due to  desat 85% and corrected on 3lpm to x 2 laps  - 06/15/2014  Walked 2.5lpm  2 laps  @ 185 ft each stopped due to  Sob adequate sats  - 02/20/2015  Walked RA x 3 laps @ 185 ft each stopped due to  Sob/desat p 2 laps, nl pace, eliminated on his 02 at 3lpm  - 12/05/2015   Walked RA  2 laps @ 185 ft each stopped due to  desat to 88% and corrected on 2lpm   rec as of 12/05/2015 =  2lpm walking more than 200 ft

## 2015-12-05 NOTE — Assessment & Plan Note (Signed)
S/p multiple er evals and the only one that helped was when given GI cocktail so likely this is all IBS related (was on omeprazole maint so unlikely acid reflux    rec trial of citrucel and pepcid and f/u with all meds in hand if not better after 2 weeks

## 2015-12-05 NOTE — Patient Instructions (Addendum)
Stop omeprazole and take pepcid 20 mg after breakfast and supper   Avoid  foods that cause gas (especially beans boiled eggs/and raw vegetables like spinach and salads)  and citrucel 1 heaping tsp twice daily with a large glass of water.  Pain should improve w/in 2 weeks   Congratulations on not smoking, it's the most important aspect of your care   No need for 02 except for walks over 200 ft > use 2lpm while walking but pace yourself  Only use your albuterol as a rescue medication to be used if you can't catch your breath by resting or doing a relaxed purse lip breathing pattern.  - The less you use it, the better it will work when you need it. - Ok to use up to 2 puffs  every 4 hours if you must but call for immediate appointment if use goes up over your usual need - Don't leave home without it !!  (think of it like the spare tire for your car)   If not satisfied you will need to make another appointment but bring all your medications with you

## 2015-12-05 NOTE — Progress Notes (Signed)
Subjective:    Patient ID: Tony Sullivan, male    DOB: 15-Aug-1961  MRN: [id number]    Brief patient profile:  60  yobm active smoker with breathing difficulties since 2004 dx as copd on advair 250 /50 but getting worse since 2014 and placed on 02 for the first time in March 2015 and referred 01/07/2014 to pulmonary clinic by Dr Kennon Holter with GOLD II copd criteria established 02/14/14    History of Present Illness  01/07/2014 1st [facility] office visit/ Tony Sullivan  Chief Complaint  Patient presents with  . Advice Only    Referred for COPD, SOB.  Pt c/o SOB, low 02, prod cough with white mucous.  Pt forgot med list, unsure of what all he is taking  still can do a [employer] but uses [facility] parking, assoc with congested coughing esp in am worse in winter. rec Stop advair   Plan A =  Automatic =  symbicort 160 Take 2 puffs first thing in am and then another 2 puffs about 12 hours later. spiriva in am only  Plan B = Backup- Proventil use it only if you can't your breath - ok to use up to every 4 hours if needed Plan C = Nebulizer up to every 4 hours if needed only if you try plan B first and it doesn't work   02/14/2014 f/u ov/Tony Sullivan re: copd GOLD II on ACEi and Advair and still smoking  Chief Complaint  Patient presents with  . Follow-up    Review pft.  Pt c/o prod cough with clear mucuos, SOB with exertion.  Interested in getting a portable 02 concentrator.    rec Plan A =  Automatic =  symbicort 160 Take 2 puffs first thing in am and then another 2 puffs about 12 hours later.                                       spiriva in am only  Plan B = Backup- Proventil use it only if you can't your breath - ok to use up to every 4 hours if needed Plan C = Nebulizer up to every 4 hours if needed only if you try plan B first and it doesn't work The key is to stop smoking completely before smoking completely stops you! 02 should be 3lpm with more than 100 ft walking  Please schedule a follow up visit  in 3 months but call sooner if needed    06/15/2014 f/u ov/Tony Sullivan re: still smoking/ GOLD II copd 02 dep with ambulation / on spiriva/symbicort Chief Complaint  Patient presents with  . Follow-up    Pt reports that cough and SOB has been more increased recently d/t weather change. Using 2.5Liter O2  No need for saba yet in any form / dry hacking cough  rec Stop lisinopril Start valsartan 160 mg one daily  Please see patient coordinator before you leave today  to schedule ambulatory portable system that will deliver  02 at 2.5 lpm to replace your tank     09/15/2014 Follow up and Med Calendar GOLD II COPD s/p exac to ER / still smoking on symbicort and spiriva  Patient returns for a follow-up and medication review. We reviewed all his medications organize them into a medication count with patient education It appears that he is taking his medications correctly but is out , has follow up  with PCP for refills.  Has cut back on smoking .  Doing well w/ no flare of cough or wheezing/dyspnea.  Last visit spiriva was stopped, no flare off inhaler.  Needs refill on omeprazole , Zyrtec and proventil  rec Follow med calendar closely and bring to each visit. Continue current regimen. Continue to work on not smoking.    02/20/2015 f/u ov/Tony Sullivan re: GOLD II / no med calendar/ no meds / still smoking  Chief Complaint  Patient presents with  . Follow-up    Pt c/o of SOB with excertion, wheezing, dry cough. Pt stopped spirvia, and uses nebs and albuterol prn.  no real change doe/ MMRC Grad 1/2 minimal variability  better on 3lpm when he uses it Easily confused with details of care and what he is vs not taking vs has run out - using multiple doses of saba in hfa and neb form more day than noct / saba helps some  >change o2 As needed  With act   03/22/2015 Follow up : COPD /GOLD II  Pt returns for 1 month follow up .  Says he still gets winded easily.  Has cough on/off. No hemoptysis , chest pain  or orthopnea.  Remains on Symbicort Twice daily  .  Remains on  O2 with activity. Does still wear at bedtime .  Uses rescue inhaler most days.  Still smoking . Discussed cessation.  rec Continue on Symbicort 2 puffs Twice daily  , rinse after use.  Begin Tudorza 1 puff Twice daily  , rinse after use.  Work on not smoking   07/31/2015  f/u ov/Tony Sullivan re: GOLD II/ still smoking / over use of saba daily  Chief Complaint  Patient presents with  . Follow-up    Pt reports breathing is fair. C/o prod cough (green phlem x last week), wheezing, chest tx in early AM.    gets panicky when walking outdoors  and uses lots of nebs since doesn't have an hfa saba  Doe = MMRC2 = can't walk a nl pace on a flat grade s sob/ no worse with cough worse this past week  rec Prevnar 13 and flu shots today Plan A =  Automatic =  symbicort 160 Take 2 puffs first thing in am and then another 2 puffs about 12 hours later and tudorza one twice daily as well                Plan B = Backup- Proair use it only if you can't your breath - ok to use up to 2 pffs  every 4 hours if needed Plan C = Nebulizer up to every 4 hours if needed only if you try plan B first and it doesn't work The key is to stop smoking completely before smoking completely stops you! No need for 02 at bedtime or with nl activities, use 3lpm with heavy exertion as needed  Please see patient coordinator before you leave today  to schedule pulmonary rehab > declined     12/05/2015  f/u ov/Tony Sullivan re: GOLD II / wears 02 2lpm at hs and when walks to store  Chief Complaint  Patient presents with  . Acute Visit    Pt c/o CP for the past 2 months- comes and goes. He states he has burning in the center of his chest and sharp pain on the left. He also c/o more SOB than usual and has been using proair 4-6 x per day on average.   no proair  today / MMRC2 = can't walk a nl pace on a flat grade s sob does ok flat on 2lpm to store   Never cp before 2 months and  constantly aware of midline burning sensation since s any fluctuation  smidge better p gi cocktail  Does not wake him at night / different pain LUQ never while sleep 2-5 minutes   No obvious day to day or daytime variability or assoc excess/ purulent sputum or mucus plugs    chest tightness, subjective wheeze or overt sinus or hb symptoms. No unusual exp hx or h/o childhood pna/ asthma or knowledge of premature birth.  Sleeping ok without nocturnal  or early am exacerbation  of respiratory  c/o's or need for noct saba. Also denies any obvious fluctuation of symptoms with weather or environmental changes or other aggravating or alleviating factors except as outlined above   Current Medications, Allergies, Complete Past Medical History, Past Surgical History, Family History, and Social History were reviewed in [employer] record.  ROS  The following are not active complaints unless bolded sore throat, dysphagia, dental problems, itching, sneezing,  nasal congestion or excess/ purulent secretions, ear ache,   fever, chills, sweats, unintended wt loss, classically pleuritic or exertional cp, hemoptysis,  orthopnea pnd or leg swelling, presyncope, palpitations, abdominal pain, anorexia, nausea, vomiting, diarrhea  or change in bowel or bladder habits, change in stools or urine, dysuria,hematuria,  rash, arthralgias, visual complaints, headache, numbness, weakness or ataxia or problems with walking or coordination,  change in mood/affect or memory.              Objective:   Physical Exam   06/15/2014  164  >  09/15/2014 158 >   02/20/2015 >156 03/22/2015 >  07/31/2015   150 > 12/05/2015  161     amb anxious mod hoarse bm nad    HEENT mild turbinate edema.  Oropharynx no thrush or excess pnd or cobblestoning.  No JVD or cervical adenopathy. Mild accessory muscle hypertrophy. Trachea midline, nl thryroid. Chest was hyperinflated by percussion with diminished breath sounds and  moderate increased exp time without wheeze. Hoover sign positive at mid inspiration. Regular rate and rhythm without murmur gallop or rub or increase P2 or edema.  Abd: no hsm, nl excursion. Ext warm without cyanosis- mod bilateral clubbing        I personally reviewed images and agree with radiology impression as follows:  CXR:  12/04/15 No pneumothorax. Linear opacities in the lateral left chest are stable, likely scarring. A vertically oriented linear density over the left lung base with lung markings on both sides is probably something on the patient. No pneumothorax identified on today's study. The cardiomediastinal silhouette is stable. No focal infiltrate, nodule, or mass. Hyperinflation of the lungs again seen.            Assessment & Plan:

## 2015-12-14 ENCOUNTER — Ambulatory Visit: Payer: Medicaid Other | Admitting: Internal Medicine

## 2015-12-25 ENCOUNTER — Telehealth: Payer: Self-pay | Admitting: Internal Medicine

## 2015-12-25 NOTE — Telephone Encounter (Signed)
Pt returning call.Tony Sullivan ° °

## 2015-12-26 NOTE — Telephone Encounter (Signed)
No callback number listed, not routed to triage box. Called pt on home number listed, no answer, no vm.  Wcb.

## 2015-12-27 NOTE — Telephone Encounter (Signed)
There is no record we called him recently  I tried calling back multiple times and number is not available  Will close encounter

## 2016-01-25 ENCOUNTER — Emergency Department (HOSPITAL_COMMUNITY): Payer: Medicaid Other

## 2016-01-25 ENCOUNTER — Telehealth: Payer: Self-pay | Admitting: *Deleted

## 2016-01-25 ENCOUNTER — Emergency Department (HOSPITAL_COMMUNITY)
Admission: EM | Admit: 2016-01-25 | Discharge: 2016-01-26 | Disposition: A | Discharge status: Home / Self Care | Payer: Medicaid Other | Attending: Emergency Medicine | Admitting: Emergency Medicine

## 2016-01-25 DIAGNOSIS — J441 Chronic obstructive pulmonary disease with (acute) exacerbation: Secondary | ICD-10-CM | POA: Diagnosis not present

## 2016-01-25 DIAGNOSIS — Z87891 Personal history of nicotine dependence: Secondary | ICD-10-CM | POA: Diagnosis not present

## 2016-01-25 DIAGNOSIS — Z792 Long term (current) use of antibiotics: Secondary | ICD-10-CM | POA: Diagnosis not present

## 2016-01-25 DIAGNOSIS — Z79899 Other long term (current) drug therapy: Secondary | ICD-10-CM | POA: Diagnosis not present

## 2016-01-25 DIAGNOSIS — Z880 Allergy status to penicillin: Secondary | ICD-10-CM | POA: Insufficient documentation

## 2016-01-25 DIAGNOSIS — I10 Essential (primary) hypertension: Secondary | ICD-10-CM | POA: Diagnosis not present

## 2016-01-25 DIAGNOSIS — Z7951 Long term (current) use of inhaled steroids: Secondary | ICD-10-CM | POA: Insufficient documentation

## 2016-01-25 DIAGNOSIS — R0602 Shortness of breath: Secondary | ICD-10-CM | POA: Diagnosis present

## 2016-01-25 IMAGING — CR DG CHEST 2V
2 series · 2 of 2 positions shown · non-contrast
Comparison: [DATE] chest radiograph.

CLINICAL DATA: Shortness of breath.  COPD.

EXAM:
CHEST  2 VIEW

[w chest pa]
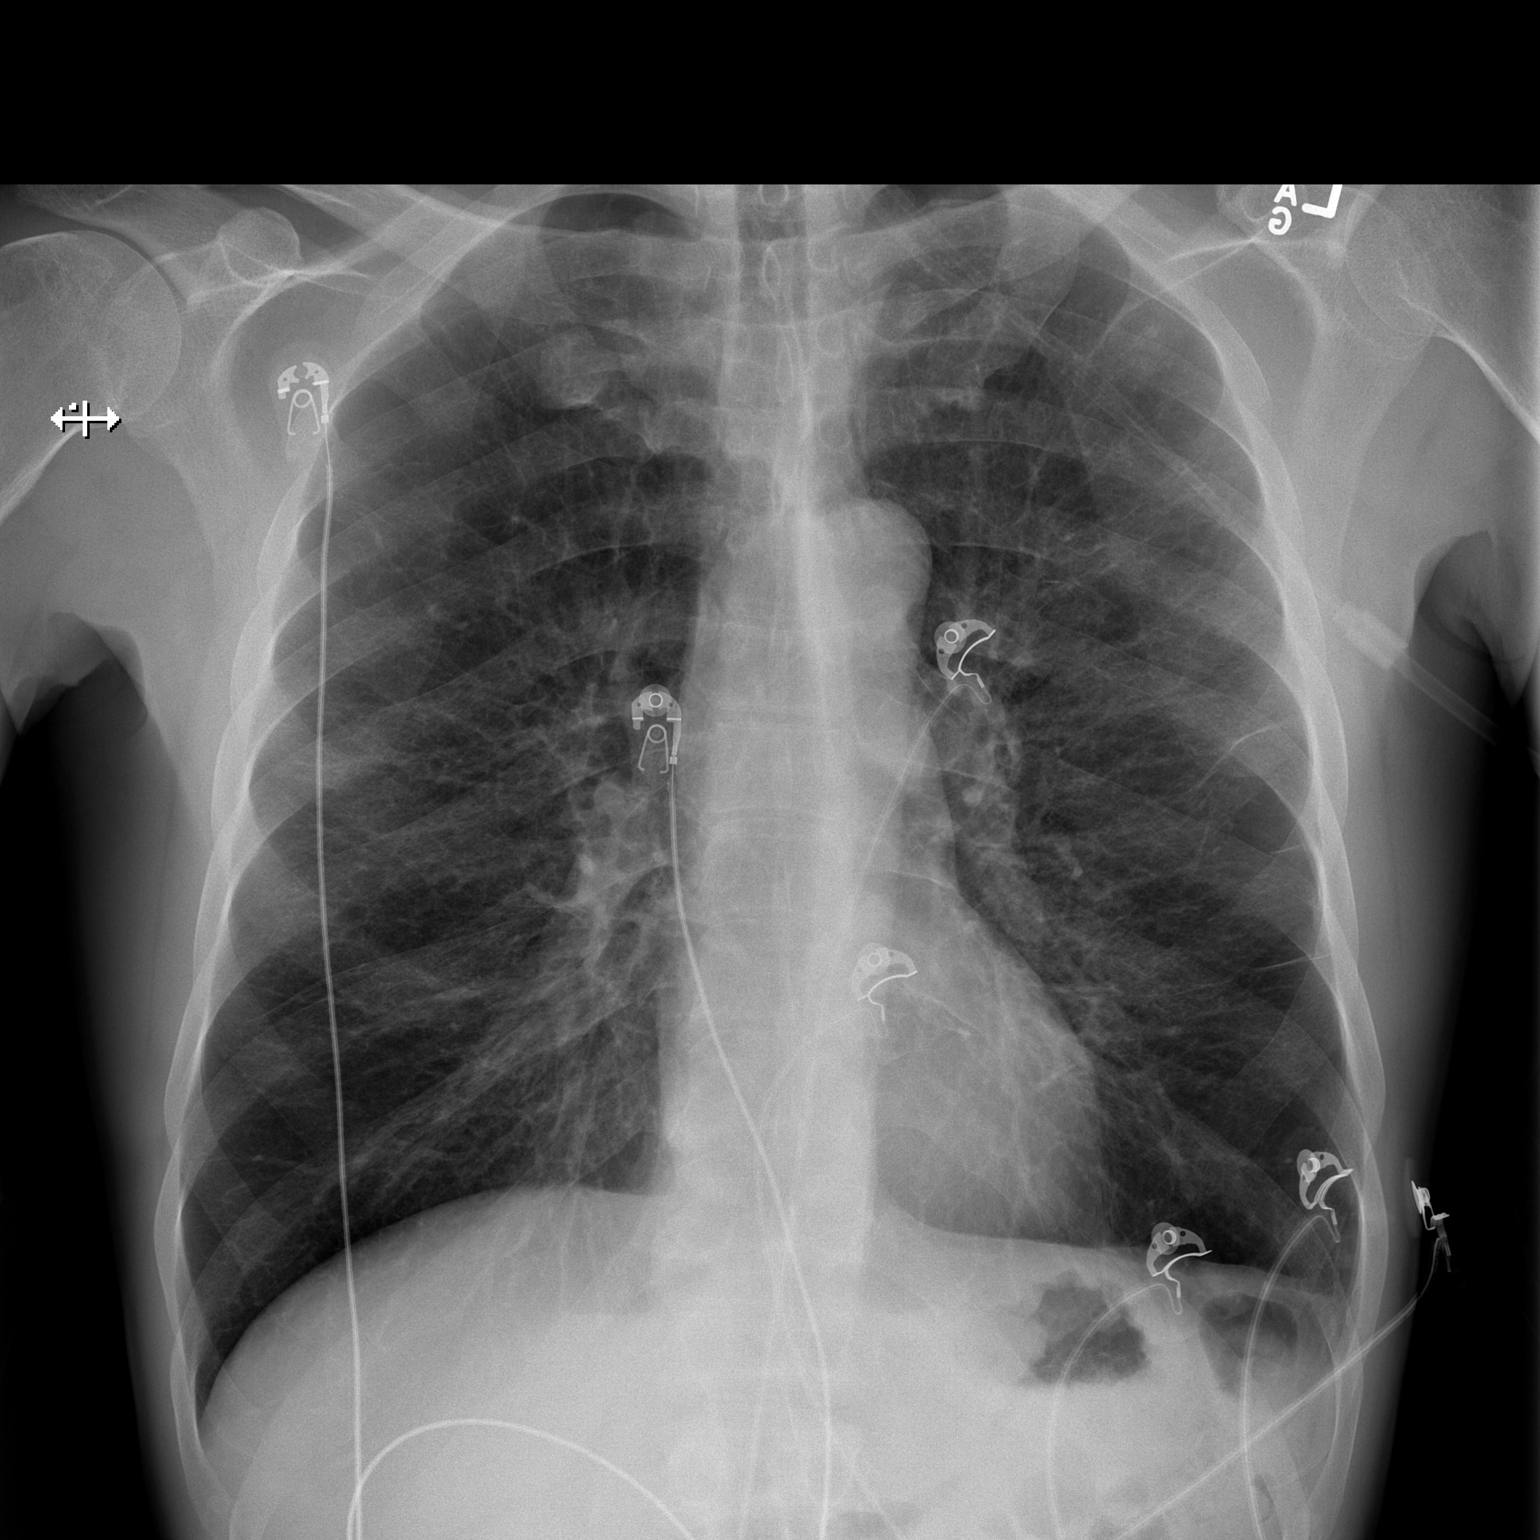

[w chest lat]
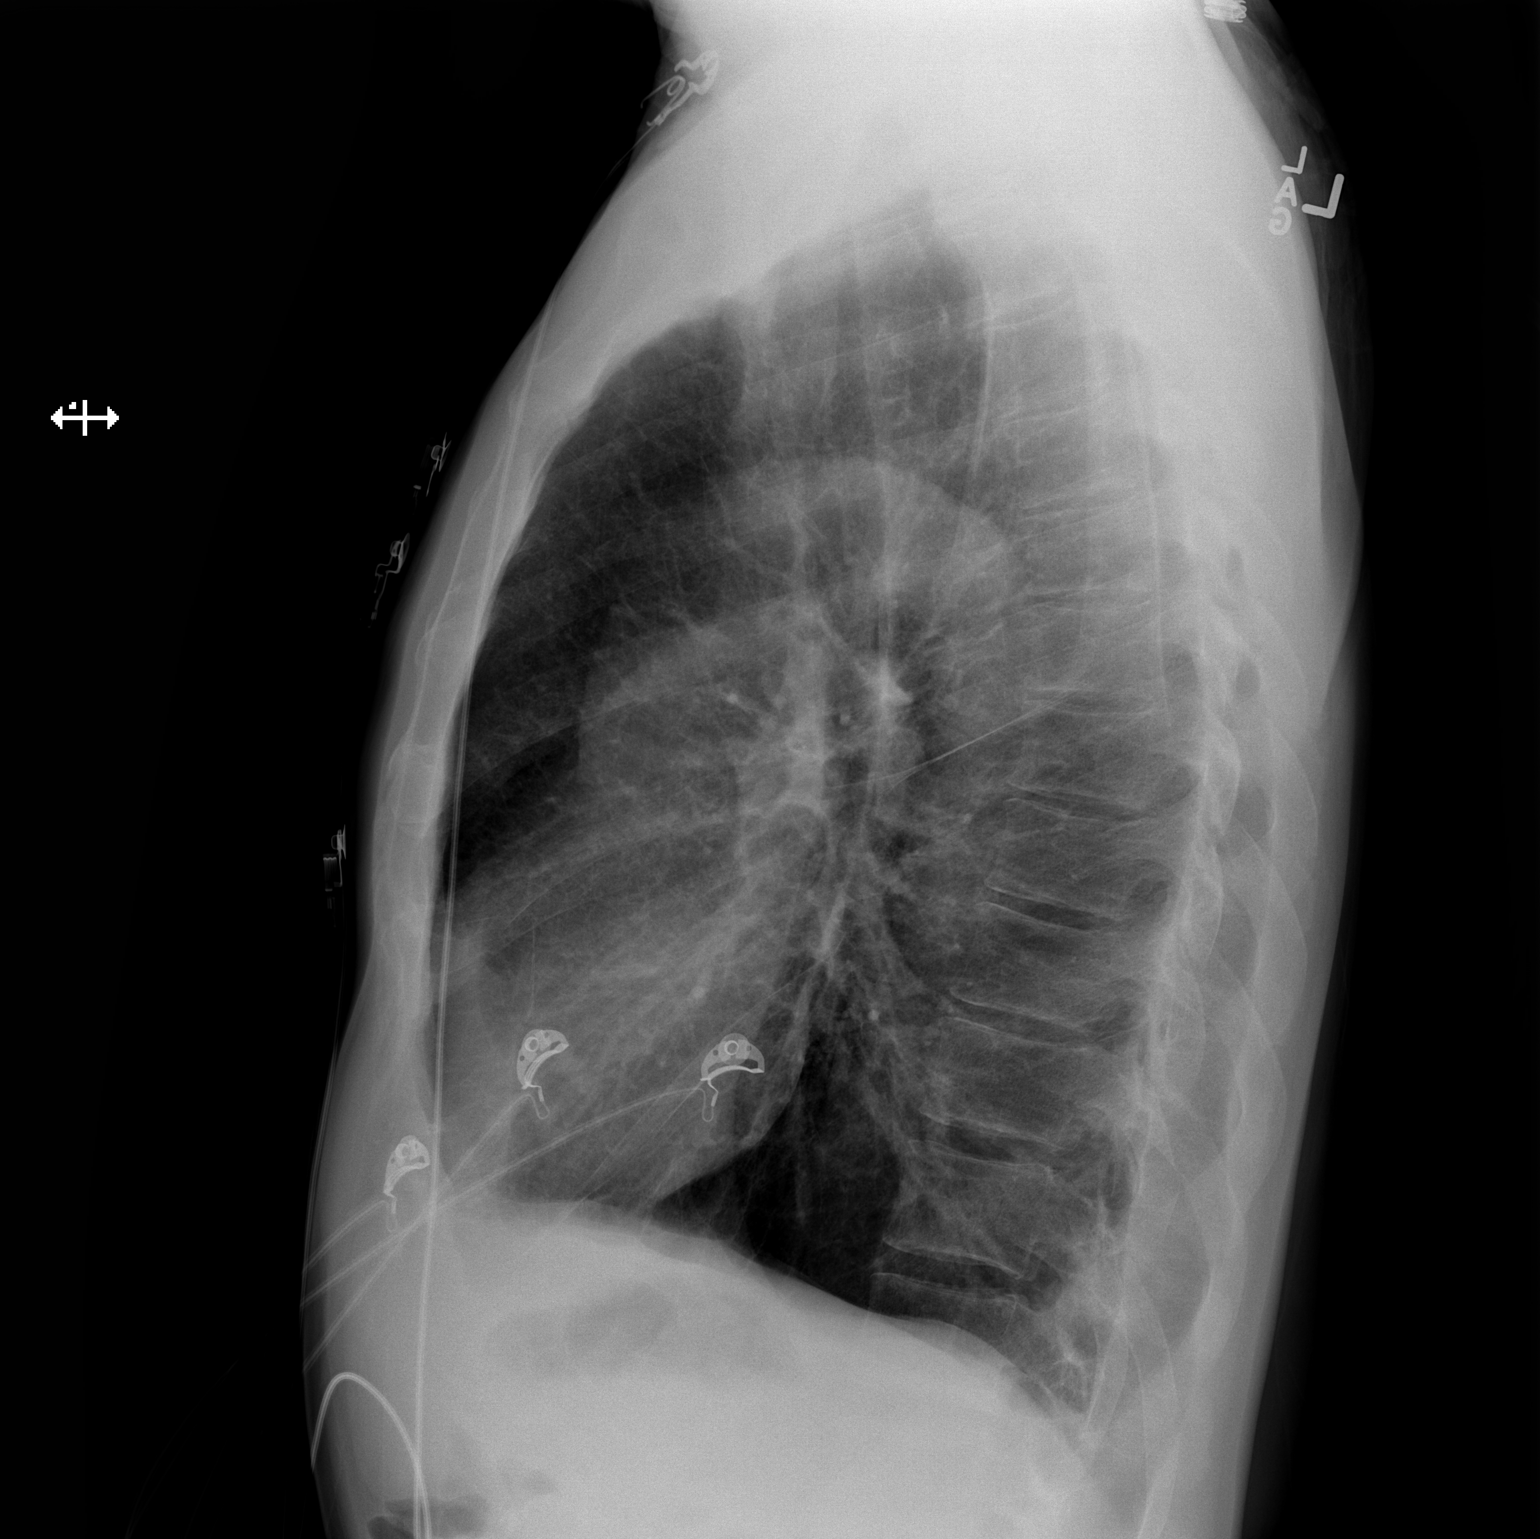

[2 of 2 positions shown; findings below may reference images not displayed]

FINDINGS: Stable cardiomediastinal silhouette with normal heart size. No
pneumothorax. No pleural effusion. Emphysema. Hyperinflated lungs.
No pulmonary edema. No acute consolidative airspace disease.
IMPRESSION: Emphysema and hyperinflated lungs, in keeping with the provided
history of COPD. Otherwise no active cardiopulmonary disease.

## 2016-01-25 MED ORDER — IPRATROPIUM-ALBUTEROL 0.5-2.5 (3) MG/3ML IN SOLN
3.0000 mL | Freq: Once | RESPIRATORY_TRACT | Status: AC
  Administered 2016-01-25: 3 mL via RESPIRATORY_TRACT
  Filled 2016-01-25: qty 3

## 2016-01-25 MED ORDER — PREDNISONE 20 MG PO TABS
60.0000 mg | ORAL_TABLET | Freq: Once | ORAL | Status: AC
  Administered 2016-01-25: 60 mg via ORAL
  Filled 2016-01-25: qty 3

## 2016-01-25 NOTE — ED Provider Notes (Signed)
CSN: [id number]     Arrival date & time 01/25/16  2130 History   First MD Initiated Contact with Patient 01/25/16 2138     Chief Complaint  Patient presents with  . COPD    Patient is a 55 y.o. male presenting with shortness of breath. The history is provided by the patient.  Shortness of Breath Severity:  Moderate Onset quality:  Sudden Duration:  2 hours Timing:  Constant Progression:  Worsening Chronicity:  New Relieved by:  Nothing Exacerbated by: standing. Associated symptoms: cough   Associated symptoms: no chest pain, no fever, no hemoptysis and no vomiting   Patient with h/o COPD, on home oxygen at night, just started smoking cigarettes again recently, presents for SOB He was at home and stood up and had wheezing/sob and chest tightness No fever/vomiting He used nebs but had minimal relief EMS was called   He now feels improved   Past Medical History  Diagnosis Date  . High blood pressure   . COPD (chronic obstructive pulmonary disease) ([place]) 2004    diagnosed in 2004, no PFT's to date.  Started on home O2 12/2013, after found to be desatting at PCP's office, and referred to pulmonology.  . Seasonal allergies   . Asthma    Past Surgical History  Procedure Laterality Date  . Appendectomy  1980  . Hip surgery Right   . Shoulder arthroscopy Right    Family History  Problem Relation Age of Onset  . Emphysema Mother   . Emphysema Father   . Allergies Mother     "everyone in family"  . Allergies Father   . Allergies Son   . Asthma Mother     "everyone in family"  . Heart disease Mother   . Heart disease Maternal Aunt   . Clotting disorder Mother   . Cancer Mother    Social History  Substance Use Topics  . Smoking status: Former Smoker -- 1.00 packs/day for 36 years    Types: Cigarettes    Quit date: 11/04/2015  . Smokeless tobacco: Never Used  . Alcohol Use: 8.4 - 12.6 oz/week    14-21 Standard drinks or equivalent per week     Comment: 2-3  beers/day, with occasional "binges".  No history of withdrawal    Review of Systems  Constitutional: Negative for fever.  Respiratory: Positive for cough and shortness of breath. Negative for hemoptysis.   Cardiovascular: Negative for chest pain.  Gastrointestinal: Negative for vomiting.  Neurological: Negative for weakness.  All other systems reviewed and are negative.     Allergies  Penicillins and Sulfa antibiotics  Home Medications   Prior to Admission medications   Medication Sig Start Date End Date Taking? Authorizing Provider  albuterol (PROAIR HFA) 108 (90 BASE) MCG/ACT inhaler 2 puffs up to every 4 hours if needed for short of breath 07/31/15  Yes [name], MD  albuterol (PROVENTIL) (2.5 MG/3ML) 0.083% nebulizer solution Take 2.5 mg by nebulization every 4 (four) hours as needed for wheezing or shortness of breath.    Yes Historical Provider, MD  amLODipine (NORVASC) 10 MG tablet Take 10 mg by mouth every morning.    Yes Historical Provider, MD  benztropine (COGENTIN) 0.5 MG tablet Take 0.5 mg by mouth at bedtime.    Yes Historical Provider, MD  budesonide-formoterol (SYMBICORT) 160-4.5 MCG/ACT inhaler Inhale 2 puffs into the lungs 2 (two) times daily. 04/05/15  Yes Tammy S Parrett, NP  cetirizine (ZYRTEC) 10 MG tablet Take 1  tablet (10 mg total) by mouth daily. Patient taking differently: Take 10 mg by mouth at bedtime.  09/15/14  Yes [name], MD  doxycycline (VIBRA-TABS) 100 MG tablet Take 100 mg by mouth 2 (two) times daily.   Yes Historical Provider, MD  famotidine (PEPCID) 20 MG tablet Take 1 tablet (20 mg total) by mouth 2 (two) times daily. 12/04/15  Yes Shawn C Joy, PA-C  loxapine (LOXITANE) 10 MG capsule Take 10 mg by mouth at bedtime.    Yes Historical Provider, MD  meloxicam (MOBIC) 7.5 MG tablet Take 7.5 mg by mouth daily as needed for pain.    Yes Historical Provider, MD  mirtazapine (REMERON) 30 MG tablet Take 30 mg by mouth at bedtime.   Yes Historical  Provider, MD  risperiDONE (RISPERDAL) 2 MG tablet Take 2 mg by mouth at bedtime.   Yes Historical Provider, MD  valsartan (DIOVAN) 160 MG tablet Take 1 tablet (160 mg total) by mouth daily. 08/30/14  Yes [name], MD  Aclidinium Bromide (TUDORZA PRESSAIR) 400 MCG/ACT AEPB Inhale 1 puff into the lungs 2 (two) times daily. Patient not taking: Reported on 01/25/2016 11/13/15   [name], MD  OXYGEN 2-3 liters with sleep and exertion as needed    Historical Provider, MD   BP 125/65 mmHg  Pulse 84  Temp(Src) 98.3 F (36.8 C) (Oral)  Resp 21  SpO2 93% Physical Exam CONSTITUTIONAL: Well developed/well nourished HEAD: Normocephalic/atraumatic EYES: EOMI/PERRL ENMT: Mucous membranes moist NECK: supple no meningeal signs SPINE/BACK:entire spine nontender CV: S1/S2 noted, no murmurs/rubs/gallops noted LUNGS: decreased BS noted bilaterally, no distress noted ABDOMEN: soft, nontender, no rebound or guarding, bowel sounds noted throughout abdomen GU:no cva tenderness NEURO: Pt is awake/alert/appropriate, moves all extremitiesx4.  No facial droop.   EXTREMITIES: pulses normal/equal, full ROM, no LE edema noted SKIN: warm, color normal PSYCH: no abnormalities of mood noted, alert and oriented to situation  ED Course  Procedures  Medications  albuterol (PROVENTIL,VENTOLIN) solution continuous neb (not administered)  ipratropium-albuterol (DUONEB) 0.5-2.5 (3) MG/3ML nebulizer solution 3 mL (3 mLs Nebulization Given 01/25/16 2232)  predniSONE (DELTASONE) tablet 60 mg (60 mg Oral Given 01/25/16 2231)    Labs Review Labs Reviewed  CBC WITH DIFFERENTIAL/PLATELET  BASIC METABOLIC PANEL  BLOOD GAS, ARTERIAL    Imaging Review Dg Chest 2 View  01/25/2016  CLINICAL DATA:  Shortness of breath.  COPD. EXAM: CHEST  2 VIEW COMPARISON:  12/04/2015 chest radiograph. FINDINGS: Stable cardiomediastinal silhouette with normal heart size. No pneumothorax. No pleural effusion. Emphysema. Hyperinflated  lungs. No pulmonary edema. No acute consolidative airspace disease. IMPRESSION: Emphysema and hyperinflated lungs, in keeping with the provided history of COPD. Otherwise no active cardiopulmonary disease. Electronically Signed   By: Ilona Sorrel M.D.   On: 01/25/2016 23:30   I have personally reviewed and evaluated these images and lab results as part of my medical decision-making.   EKG Interpretation   Date/Time:  Thursday January 25 2016 21:48:49 EDT Ventricular Rate:  82 PR Interval:  167 QRS Duration: 83 QT Interval:  369 QTC Calculation: 431 R Axis:   -126 Text Interpretation:  Sinus rhythm Biatrial enlargement Right axis  deviation Probable anteroseptal infarct, old Confirmed by Christy Gentles  MD,  Davion Flannery ([id number]) on 01/26/2016 12:00:16 AM     1:12 AM While resting in bed, pt in no distress However upon ambulation, he felt sob, similar to prior COPD and became hypoxic on oxygen While at rest he is stable Will give  another neb Labs ordered Signed out to dr Venora Maples, to f/u on labs and reassess patient If he still has wheezing/sob on exertion he will need admitted  MDM   Final diagnoses:  None    Nursing notes including past medical history and social history reviewed and considered in documentation xrays/imaging reviewed by myself and considered during evaluation     Ripley Fraise, MD 01/26/16 [phone]

## 2016-01-25 NOTE — ED Notes (Signed)
Per EMS-   Pt started smoking again 3 days ago. SOB upon exertion. 3 neb treatments since 20:00 2.'5mg'$  at home prior to arrival. Hx of COPD.

## 2016-01-25 NOTE — Telephone Encounter (Signed)
[employer] has approved Tudorza until 01/19/17 Pt ID: [id number]  [facility] pharmacy informed.

## 2016-01-25 NOTE — ED Notes (Signed)
Bed: WA03 Expected date:  Expected time:  Means of arrival:  Comments: [facility] [place] / COPD

## 2016-01-26 LAB — BLOOD GAS, ARTERIAL
Acid-base deficit: 1.3 mmol/L (ref 0.0–2.0)
Bicarbonate: 21.7 mEq/L (ref 20.0–24.0)
Drawn by: 235321
O2 Content: 2 L/min
O2 Saturation: 93.8 %
Patient temperature: 98.6
TCO2: 18.7 mmol/L (ref 0–100)
pCO2 arterial: 33.1 mmHg — ABNORMAL LOW (ref 35.0–45.0)
pH, Arterial: 7.431 (ref 7.350–7.450)
pO2, Arterial: 68.8 mmHg — ABNORMAL LOW (ref 80.0–100.0)

## 2016-01-26 LAB — CBC WITH DIFFERENTIAL/PLATELET
Basophils Absolute: 0 10*3/uL (ref 0.0–0.1)
Basophils Relative: 0 %
Eosinophils Absolute: 0.1 10*3/uL (ref 0.0–0.7)
Eosinophils Relative: 1 %
HCT: 44.2 % (ref 39.0–52.0)
Hemoglobin: 15.3 g/dL (ref 13.0–17.0)
Lymphocytes Relative: 10 %
Lymphs Abs: 1.5 10*3/uL (ref 0.7–4.0)
MCH: 32.9 pg (ref 26.0–34.0)
MCHC: 34.6 g/dL (ref 30.0–36.0)
MCV: 95.1 fL (ref 78.0–100.0)
Monocytes Absolute: 0.9 10*3/uL (ref 0.1–1.0)
Monocytes Relative: 6 %
Neutro Abs: 12.2 10*3/uL — ABNORMAL HIGH (ref 1.7–7.7)
Neutrophils Relative %: 83 %
Platelets: 271 10*3/uL (ref 150–400)
RBC: 4.65 MIL/uL (ref 4.22–5.81)
RDW: 13.7 % (ref 11.5–15.5)
WBC: 14.7 10*3/uL — ABNORMAL HIGH (ref 4.0–10.5)

## 2016-01-26 LAB — BASIC METABOLIC PANEL
Anion gap: 9 (ref 5–15)
BUN: 16 mg/dL (ref 6–20)
CO2: 22 mmol/L (ref 22–32)
Calcium: 9 mg/dL (ref 8.9–10.3)
Chloride: 109 mmol/L (ref 101–111)
Creatinine, Ser: 0.92 mg/dL (ref 0.61–1.24)
GFR calc Af Amer: >60 mL/min (ref >60)
GFR calc non Af Amer: >60 mL/min (ref >60)
Glucose, Bld: 113 mg/dL — ABNORMAL HIGH (ref 65–99)
Potassium: 3.6 mmol/L (ref 3.5–5.1)
Sodium: 140 mmol/L (ref 135–145)

## 2016-01-26 MED ORDER — PREDNISONE 10 MG PO TABS: 60.0000 mg | ORAL_TABLET | Freq: Every day | ORAL | Status: DC

## 2016-01-26 MED ORDER — ALBUTEROL (5 MG/ML) CONTINUOUS INHALATION SOLN
10.0000 mg/h | INHALATION_SOLUTION | Freq: Once | RESPIRATORY_TRACT | Status: AC
  Administered 2016-01-26: 10 mg/h via RESPIRATORY_TRACT
  Filled 2016-01-26: qty 20

## 2016-01-26 MED ORDER — ALBUTEROL SULFATE HFA 108 (90 BASE) MCG/ACT IN AERS
2.0000 | INHALATION_SPRAY | RESPIRATORY_TRACT | Status: DC
  Administered 2016-01-26: 2 via RESPIRATORY_TRACT
  Filled 2016-01-26: qty 6.7

## 2016-01-26 NOTE — Discharge Instructions (Signed)

## 2016-01-26 NOTE — ED Provider Notes (Signed)
3:28 AM Patient feels much better this time.  His breathing is significantly improved.  He does still drop his O2 sats to 89% with ambulation but he has home oxygen.  He mainly wears this at night.  I have told him that he may need to wear during the day over the next several days.  He'll be treated for COPD exacerbation.  He feels comfortable going home.  He understands to return to the emergency department for new or worsening symptoms.  Dg Chest 2 View  01/25/2016  CLINICAL DATA:  Shortness of breath.  COPD. EXAM: CHEST  2 VIEW COMPARISON:  12/04/2015 chest radiograph. FINDINGS: Stable cardiomediastinal silhouette with normal heart size. No pneumothorax. No pleural effusion. Emphysema. Hyperinflated lungs. No pulmonary edema. No acute consolidative airspace disease. IMPRESSION: Emphysema and hyperinflated lungs, in keeping with the provided history of COPD. Otherwise no active cardiopulmonary disease. Electronically Signed   By: Ilona Sorrel M.D.   On: 01/25/2016 23:30   I personally reviewed the imaging tests through PACS system I reviewed available ER/hospitalization records through the [facility] [place], MD 01/26/16 [phone]

## 2016-01-29 ENCOUNTER — Ambulatory Visit: Payer: Medicaid Other | Admitting: Internal Medicine

## 2016-02-29 ENCOUNTER — Encounter (HOSPITAL_COMMUNITY): Payer: Self-pay | Admitting: Emergency Medicine

## 2016-02-29 ENCOUNTER — Emergency Department (HOSPITAL_COMMUNITY)
Admission: EM | Admit: 2016-02-29 | Discharge: 2016-02-29 | Disposition: A | Discharge status: Home / Self Care | Payer: Medicaid Other | Attending: Emergency Medicine | Admitting: Emergency Medicine

## 2016-02-29 ENCOUNTER — Emergency Department (HOSPITAL_COMMUNITY): Payer: Medicaid Other

## 2016-02-29 DIAGNOSIS — Z7952 Long term (current) use of systemic steroids: Secondary | ICD-10-CM | POA: Diagnosis not present

## 2016-02-29 DIAGNOSIS — Z7951 Long term (current) use of inhaled steroids: Secondary | ICD-10-CM | POA: Diagnosis not present

## 2016-02-29 DIAGNOSIS — Z792 Long term (current) use of antibiotics: Secondary | ICD-10-CM | POA: Insufficient documentation

## 2016-02-29 DIAGNOSIS — Z79899 Other long term (current) drug therapy: Secondary | ICD-10-CM | POA: Insufficient documentation

## 2016-02-29 DIAGNOSIS — Z791 Long term (current) use of non-steroidal anti-inflammatories (NSAID): Secondary | ICD-10-CM | POA: Diagnosis not present

## 2016-02-29 DIAGNOSIS — Z87891 Personal history of nicotine dependence: Secondary | ICD-10-CM | POA: Insufficient documentation

## 2016-02-29 DIAGNOSIS — R0602 Shortness of breath: Secondary | ICD-10-CM | POA: Diagnosis present

## 2016-02-29 DIAGNOSIS — J441 Chronic obstructive pulmonary disease with (acute) exacerbation: Secondary | ICD-10-CM | POA: Diagnosis not present

## 2016-02-29 IMAGING — CR DG CHEST 2V
2 series · 2 of 2 positions shown · non-contrast
Comparison: [DATE]

CLINICAL DATA: Shortness of breath for 2 days, initial encounter

EXAM:
CHEST  2 VIEW

[w chest pa]
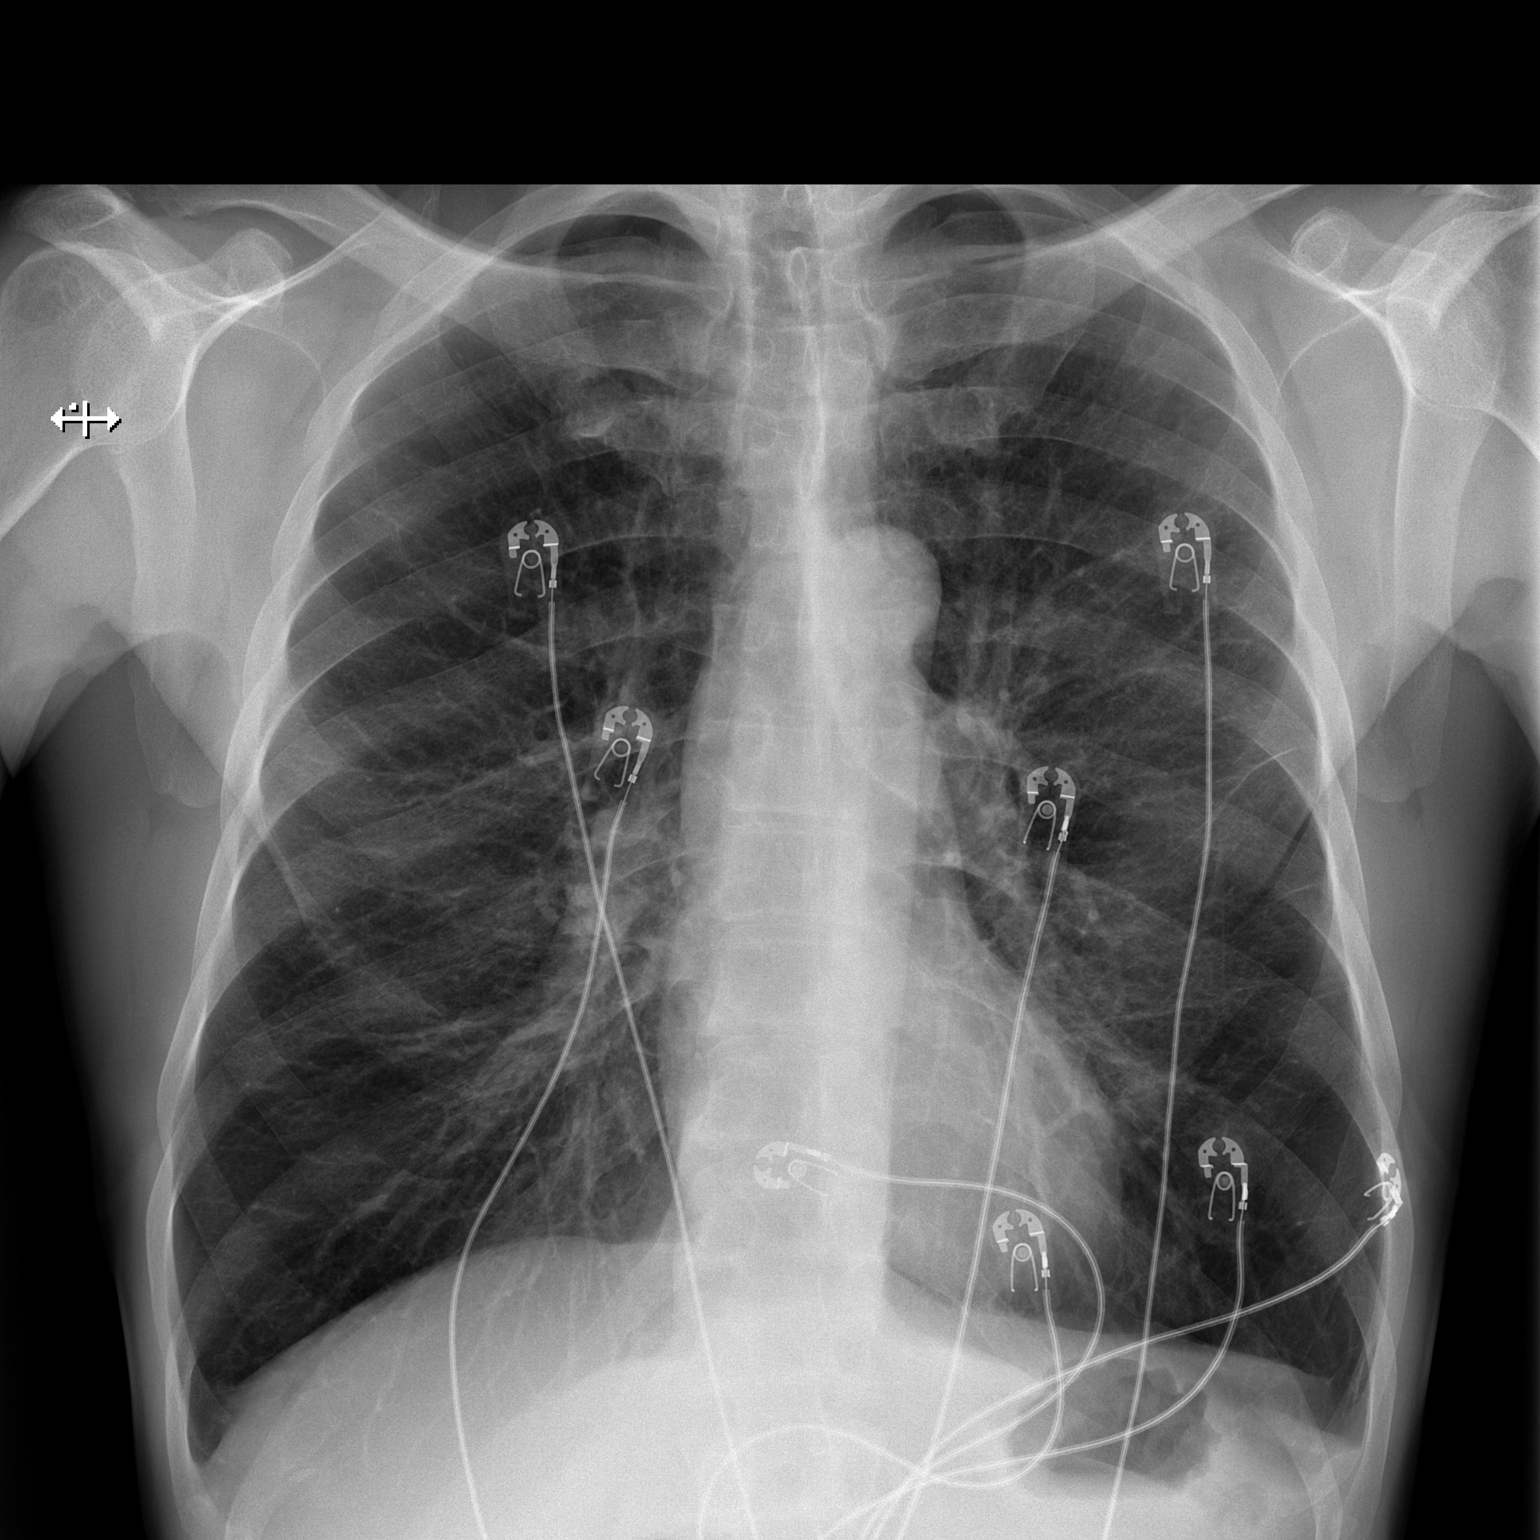

[w chest lat]
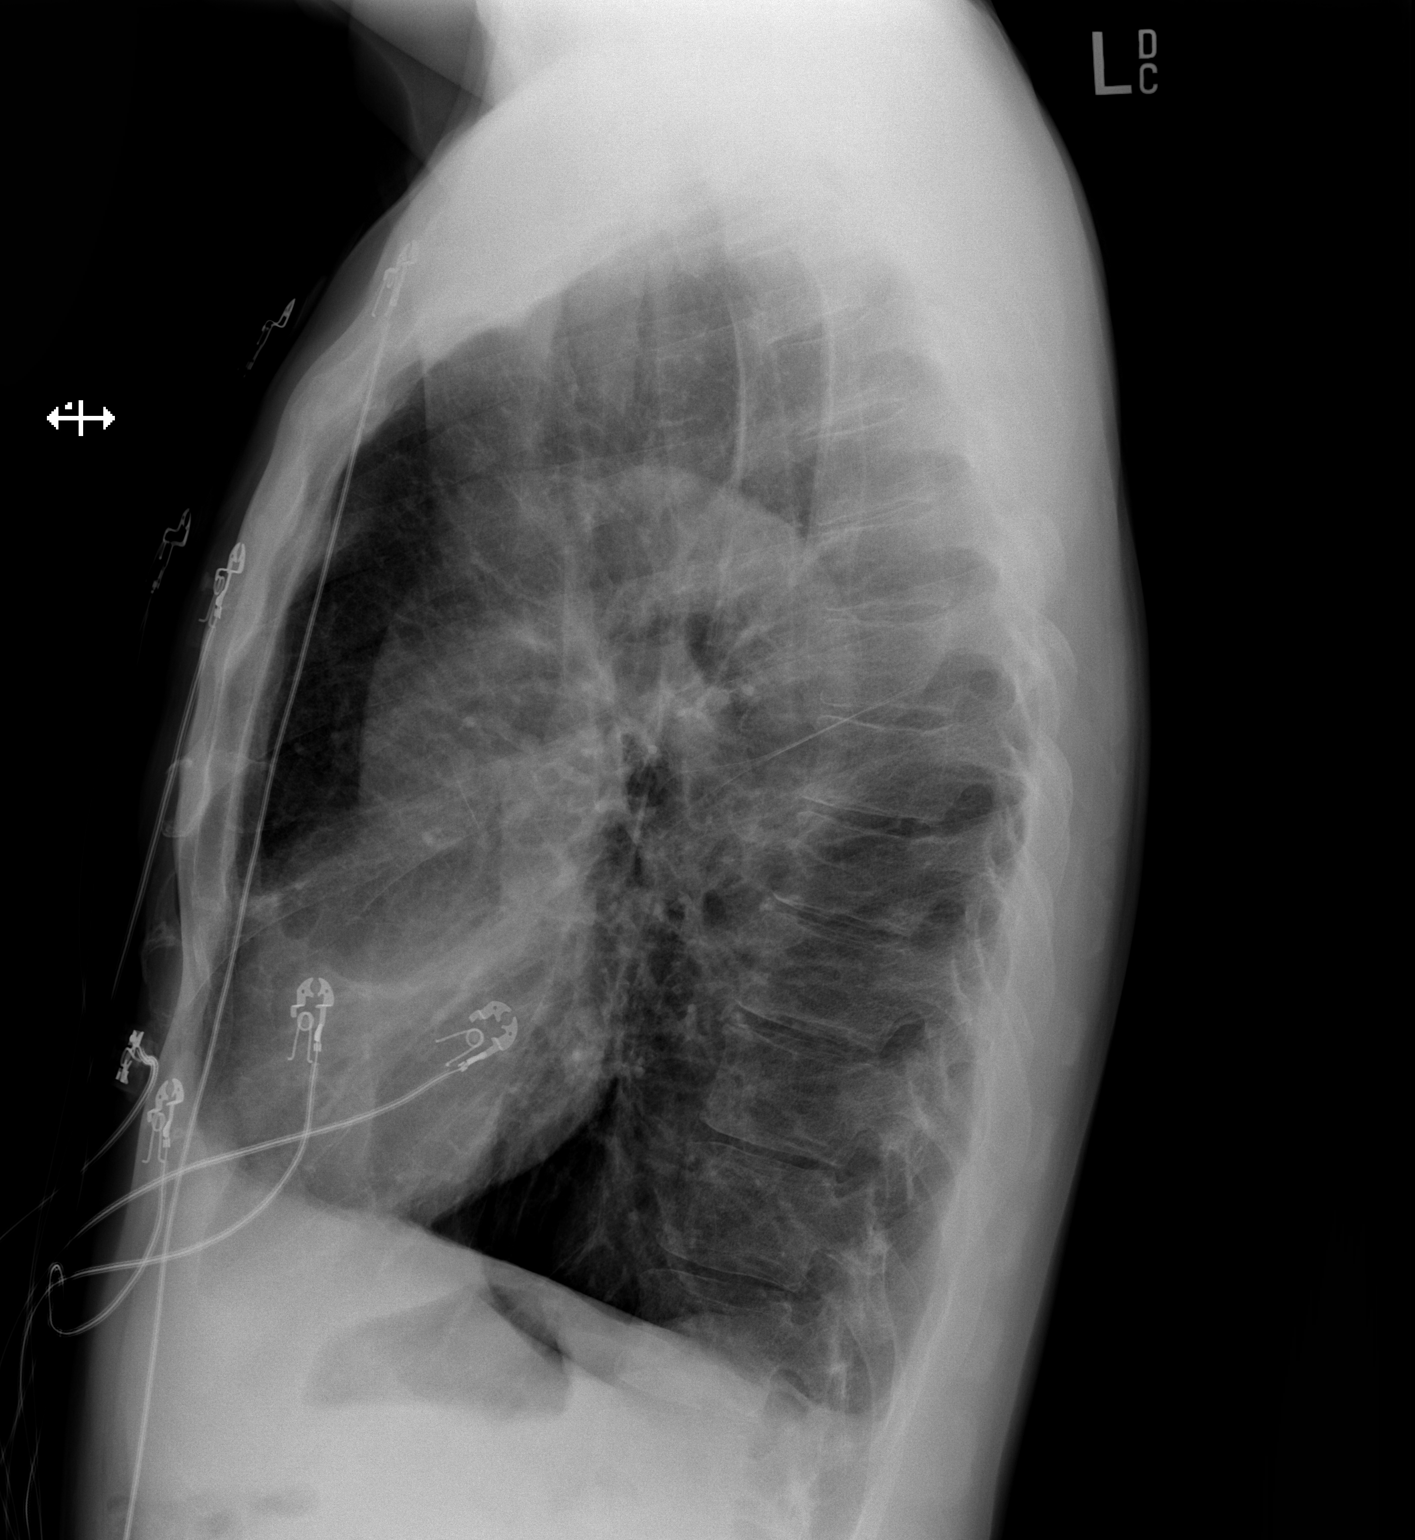

[2 of 2 positions shown; findings below may reference images not displayed]

FINDINGS: Cardiac shadow is stable. The lungs are again hyperinflated
consistent with COPD. No focal infiltrate or sizable effusion is
noted. No acute bony abnormality is seen.
IMPRESSION: COPD without acute abnormality.

## 2016-02-29 MED ORDER — PREDNISONE 20 MG PO TABS: 20.0000 mg | ORAL_TABLET | Freq: Two times a day (BID) | ORAL | Status: DC

## 2016-02-29 MED ORDER — DOXYCYCLINE HYCLATE 100 MG PO CAPS: 100.0000 mg | ORAL_CAPSULE | Freq: Two times a day (BID) | ORAL | Status: DC

## 2016-02-29 MED ORDER — ALBUTEROL SULFATE (2.5 MG/3ML) 0.083% IN NEBU
2.5000 mg | INHALATION_SOLUTION | Freq: Once | RESPIRATORY_TRACT | Status: AC
  Administered 2016-02-29: 2.5 mg via RESPIRATORY_TRACT
  Filled 2016-02-29: qty 3

## 2016-02-29 MED ORDER — ALBUTEROL SULFATE (2.5 MG/3ML) 0.083% IN NEBU
5.0000 mg | INHALATION_SOLUTION | Freq: Once | RESPIRATORY_TRACT | Status: AC
  Administered 2016-02-29: 5 mg via RESPIRATORY_TRACT
  Filled 2016-02-29: qty 6

## 2016-02-29 MED ORDER — PREDNISONE 20 MG PO TABS
60.0000 mg | ORAL_TABLET | Freq: Once | ORAL | Status: AC
  Administered 2016-02-29: 60 mg via ORAL
  Filled 2016-02-29: qty 3

## 2016-02-29 NOTE — ED Notes (Signed)
MD at bedside. 

## 2016-02-29 NOTE — ED Provider Notes (Signed)
CSN: [id number]     Arrival date & time 02/29/16  1601 History   First MD Initiated Contact with Patient 02/29/16 1726     Chief Complaint  Patient presents with  . Shortness of Breath  . Near Syncope      HPI Patient presents for evaluation complaint difficulty breathing. Cough and difficulty breathing over the last 2-3 days. States she feels congestion in his nose despite taking over-the-counter antihistamine, and Flonase. Wears home O2 at night. During the day with exacerbations. He has had increase in clear sputum. No hemoptysis. No fevers. No chest pain. No peripheral edema.   Past Medical History  Diagnosis Date  . High blood pressure   . COPD (chronic obstructive pulmonary disease) ([place]) 2004    diagnosed in 2004, no PFT's to date.  Started on home O2 12/2013, after found to be desatting at PCP's office, and referred to pulmonology.  . Seasonal allergies   . Asthma    Past Surgical History  Procedure Laterality Date  . Appendectomy  1980  . Hip surgery Right   . Shoulder arthroscopy Right    Family History  Problem Relation Age of Onset  . Emphysema Mother   . Emphysema Father   . Allergies Mother     "everyone in family"  . Allergies Father   . Allergies Son   . Asthma Mother     "everyone in family"  . Heart disease Mother   . Heart disease Maternal Aunt   . Clotting disorder Mother   . Cancer Mother    Social History  Substance Use Topics  . Smoking status: Former Smoker -- 1.00 packs/day for 36 years    Types: Cigarettes    Quit date: 11/04/2015  . Smokeless tobacco: Never Used  . Alcohol Use: 8.4 - 12.6 oz/week    14-21 Standard drinks or equivalent per week     Comment: 2-3 beers/day, with occasional "binges".  No history of withdrawal    Review of Systems  Constitutional: Negative for fever, chills, diaphoresis, appetite change and fatigue.  HENT: Negative for mouth sores, sore throat and trouble swallowing.   Eyes: Negative for visual disturbance.   Respiratory: Positive for cough and shortness of breath. Negative for wheezing.   Cardiovascular: Negative for chest pain.  Gastrointestinal: Negative for nausea, vomiting, abdominal pain, diarrhea and abdominal distention.  Endocrine: Negative for polydipsia, polyphagia and polyuria.  Genitourinary: Negative for dysuria, frequency and hematuria.  Musculoskeletal: Negative for gait problem.  Skin: Negative for color change, pallor and rash.  Neurological: Negative for dizziness, syncope, light-headedness and headaches.  Hematological: Does not bruise/bleed easily.  Psychiatric/Behavioral: Negative for behavioral problems and confusion.      Allergies  Penicillins and Sulfa antibiotics  Home Medications   Prior to Admission medications   Medication Sig Start Date End Date Taking? Authorizing Provider  Aclidinium Bromide (TUDORZA PRESSAIR) 400 MCG/ACT AEPB Inhale 1 puff into the lungs 2 (two) times daily. 11/13/15  Yes [name], MD  albuterol [facility] HFA) 108 (90 BASE) MCG/ACT inhaler 2 puffs up to every 4 hours if needed for short of breath 07/31/15  Yes [name], MD  albuterol (PROVENTIL) (2.5 MG/3ML) 0.083% nebulizer solution Take 2.5 mg by nebulization every 4 (four) hours as needed for wheezing or shortness of breath.    Yes Historical Provider, MD  amLODipine (NORVASC) 10 MG tablet Take 10 mg by mouth every morning.    Yes Historical Provider, MD  benztropine (COGENTIN) 0.5 MG  tablet Take 0.5 mg by mouth at bedtime.    Yes Historical Provider, MD  budesonide-formoterol (SYMBICORT) 160-4.5 MCG/ACT inhaler Inhale 2 puffs into the lungs 2 (two) times daily. 04/05/15  Yes Tammy S Parrett, NP  cetirizine (ZYRTEC) 10 MG tablet Take 1 tablet (10 mg total) by mouth daily. Patient taking differently: Take 10 mg by mouth at bedtime.  09/15/14  Yes [name], MD  famotidine (PEPCID) 20 MG tablet Take 1 tablet (20 mg total) by mouth 2 (two) times daily. 12/04/15  Yes Shawn C Joy,  PA-C  loxapine (LOXITANE) 10 MG capsule Take 10 mg by mouth at bedtime.    Yes Historical Provider, MD  meloxicam (MOBIC) 7.5 MG tablet Take 7.5 mg by mouth daily as needed for pain.    Yes Historical Provider, MD  mirtazapine (REMERON) 30 MG tablet Take 30 mg by mouth at bedtime.   Yes Historical Provider, MD  OXYGEN 2-3 liters with sleep and exertion as needed   Yes Historical Provider, MD  risperiDONE (RISPERDAL) 2 MG tablet Take 2 mg by mouth at bedtime.   Yes Historical Provider, MD  valsartan (DIOVAN) 160 MG tablet Take 1 tablet (160 mg total) by mouth daily. 08/30/14  Yes [name], MD  doxycycline (VIBRAMYCIN) 100 MG capsule Take 1 capsule (100 mg total) by mouth 2 (two) times daily. 02/29/16   Tanna Furry, MD  predniSONE (DELTASONE) 20 MG tablet Take 1 tablet (20 mg total) by mouth 2 (two) times daily with a meal. 02/29/16   Tanna Furry, MD   BP 146/92 mmHg  Pulse 79  Temp(Src) 98.9 F (37.2 C) (Oral)  Resp 18  SpO2 92% Physical Exam  Constitutional: He is oriented to person, place, and time. He appears well-developed and well-nourished. No distress.  HENT:  Head: Normocephalic.  Eyes: Conjunctivae are normal. Pupils are equal, round, and reactive to light. No scleral icterus.  Neck: Normal range of motion. Neck supple. No thyromegaly present.  Cardiovascular: Normal rate and regular rhythm.  Exam reveals no gallop and no friction rub.   No murmur heard. Pulmonary/Chest: Effort normal. No respiratory distress. He has wheezes in the right upper field, the right middle field, the right lower field, the left upper field, the left middle field and the left lower field. He has no rales.  Wheezing prior to first exam. After first exam nearly clear. On second exam clear. No prolongation. Feels "much much better".  Abdominal: Soft. Bowel sounds are normal. He exhibits no distension. There is no tenderness. There is no rebound.  Musculoskeletal: Normal range of motion.  Neurological: He is  alert and oriented to person, place, and time.  Skin: Skin is warm and dry. No rash noted.  Psychiatric: He has a normal mood and affect. His behavior is normal.    ED Course  Procedures (including critical care time) Labs Review Labs Reviewed - No data to display  Imaging Review Dg Chest 2 View  02/29/2016  CLINICAL DATA:  Shortness of breath for 2 days, initial encounter EXAM: CHEST  2 VIEW COMPARISON:  01/25/2016 FINDINGS: Cardiac shadow is stable. The lungs are again hyperinflated consistent with COPD. No focal infiltrate or sizable effusion is noted. No acute bony abnormality is seen. IMPRESSION: COPD without acute abnormality. Electronically Signed   By: Inez Catalina M.D.   On: 02/29/2016 16:42   I have personally reviewed and evaluated these images and lab results as part of my medical decision-making.   EKG Interpretation None  MDM   Final diagnoses:  COPD exacerbation ([place])   Patient feeling much improved. Sats 91-92%. States typically there has exacerbations he wears O2 at 1 L with activity, 2 L at night. He is appropriate for discharge home. Prednisone, doxycycline, he has prescriptions for his inhalers at home.    Tanna Furry, MD 02/29/16 1919

## 2016-02-29 NOTE — Discharge Instructions (Signed)
Chronic Obstructive Pulmonary Disease Chronic obstructive pulmonary disease (COPD) is a common lung condition in which airflow from the lungs is limited. COPD is a general term that can be used to describe many different lung problems that limit airflow, including both chronic bronchitis and emphysema. If you have COPD, your lung function will probably never return to normal, but there are measures you can take to improve lung function and make yourself feel better. CAUSES   Smoking (common).  Exposure to secondhand smoke.  Genetic problems.  Chronic inflammatory lung diseases or recurrent infections. SYMPTOMS  Shortness of breath, especially with physical activity.  Deep, persistent (chronic) cough with a large amount of thick mucus.  Wheezing.  Rapid breaths (tachypnea).  Gray or bluish discoloration (cyanosis) of the skin, especially in your fingers, toes, or lips.  Fatigue.  Weight loss.  Frequent infections or episodes when breathing symptoms become much worse (exacerbations).  Chest tightness. DIAGNOSIS Your health care provider will take a medical history and perform a physical examination to diagnose COPD. Additional tests for COPD may include:  Lung (pulmonary) function tests.  Chest X-ray.  CT scan.  Blood tests. TREATMENT  Treatment for COPD may include:  Inhaler and nebulizer medicines. These help manage the symptoms of COPD and make your breathing more comfortable.  Supplemental oxygen. Supplemental oxygen is only helpful if you have a low oxygen level in your blood.  Exercise and physical activity. These are beneficial for nearly all people with COPD.  Lung surgery or transplant.  Nutrition therapy to gain weight, if you are underweight.  Pulmonary rehabilitation. This may involve working with a team of health care providers and specialists, such as respiratory, occupational, and physical therapists. HOME CARE INSTRUCTIONS  Take all medicines  (inhaled or pills) as directed by your health care provider.  Avoid over-the-counter medicines or cough syrups that dry up your airway (such as antihistamines) and slow down the elimination of secretions unless instructed otherwise by your health care provider.  If you are a smoker, the most important thing that you can do is stop smoking. Continuing to smoke will cause further lung damage and breathing trouble. Ask your health care provider for help with quitting smoking. He or she can direct you to community resources or hospitals that provide support.  Avoid exposure to irritants such as smoke, chemicals, and fumes that aggravate your breathing.  Use oxygen therapy and pulmonary rehabilitation if directed by your health care provider. If you require home oxygen therapy, ask your health care provider whether you should purchase a pulse oximeter to measure your oxygen level at home.  Avoid contact with individuals who have a contagious illness.  Avoid extreme temperature and humidity changes.  Eat healthy foods. Eating smaller, more frequent meals and resting before meals may help you maintain your strength.  Stay active, but balance activity with periods of rest. Exercise and physical activity will help you maintain your ability to do things you want to do.  Preventing infection and hospitalization is very important when you have COPD. Make sure to receive all the vaccines your health care provider recommends, especially the pneumococcal and influenza vaccines. Ask your health care provider whether you need a pneumonia vaccine.  Learn and use relaxation techniques to manage stress.  Learn and use controlled breathing techniques as directed by your health care provider. Controlled breathing techniques include:  Pursed lip breathing. Start by breathing in (inhaling) through your nose for 1 second. Then, purse your lips as if you were   going to whistle and breathe out (exhale) through the  pursed lips for 2 seconds.  Diaphragmatic breathing. Start by putting one hand on your abdomen just above your waist. Inhale slowly through your nose. The hand on your abdomen should move out. Then purse your lips and exhale slowly. You should be able to feel the hand on your abdomen moving in as you exhale.  Learn and use controlled coughing to clear mucus from your lungs. Controlled coughing is a series of short, progressive coughs. The steps of controlled coughing are: 1. Lean your head slightly forward. 2. Breathe in deeply using diaphragmatic breathing. 3. Try to hold your breath for 3 seconds. 4. Keep your mouth slightly open while coughing twice. 5. Spit any mucus out into a tissue. 6. Rest and repeat the steps once or twice as needed. SEEK MEDICAL CARE IF:  You are coughing up more mucus than usual.  There is a change in the color or thickness of your mucus.  Your breathing is more labored than usual.  Your breathing is faster than usual. SEEK IMMEDIATE MEDICAL CARE IF:  You have shortness of breath while you are resting.  You have shortness of breath that prevents you from:  Being able to talk.  Performing your usual physical activities.  You have chest pain lasting longer than 5 minutes.  Your skin color is more cyanotic than usual.  You measure low oxygen saturations for longer than 5 minutes with a pulse oximeter. MAKE SURE YOU:  Understand these instructions.  Will watch your condition.  Will get help right away if you are not doing well or get worse.   This information is not intended to replace advice given to you by your health care provider. Make sure you discuss any questions you have with your health care provider.   Document Released: 07/24/2005 Document Revised: 11/04/2014 Document Reviewed: 06/10/2013 Elsevier Interactive Patient Education 2016 [employer]  

## 2016-02-29 NOTE — ED Notes (Signed)
Pt c/o dizziness and difficulty getting a full breath due to nasal congestion x several days. Pt states he wears home O2 for emphysema. Pt states he hasn't taken his medication today or his breathing treatment.

## 2016-04-01 ENCOUNTER — Ambulatory Visit (INDEPENDENT_AMBULATORY_CARE_PROVIDER_SITE_OTHER): Payer: Medicaid Other | Admitting: Internal Medicine

## 2016-04-01 ENCOUNTER — Encounter: Payer: Self-pay | Admitting: Internal Medicine

## 2016-04-01 VITALS — BP 120/82 | HR 80 | Ht 71.0 in | Wt 150.0 lb

## 2016-04-01 DIAGNOSIS — J9611 Chronic respiratory failure with hypoxia: Secondary | ICD-10-CM

## 2016-04-01 DIAGNOSIS — J449 Chronic obstructive pulmonary disease, unspecified: Secondary | ICD-10-CM

## 2016-04-01 MED ORDER — FLUTTER DEVI: Status: DC

## 2016-04-01 MED ORDER — FAMOTIDINE 20 MG PO TABS: ORAL_TABLET | ORAL | Status: DC

## 2016-04-01 NOTE — Patient Instructions (Addendum)
Ok to stop omeprazole when it runs out and just take pepcid at or after bfast and supper  For cough/ congestion > flutter valve as much as possible   Plan A = Automatic =  symbicort 160 Take 2 puffs first thing in am and then another 2 puffs about 12 hours later and chase symbicort with the tudorza     Plan B = Backup Only use your albuterol as a rescue medication to be used if you can't catch your breath by resting or doing a relaxed purse lip breathing pattern.  - The less you use it, the better it will work when you need it. - Ok to use the inhaler up to 2 puffs  every 4 hours if you must but call for appointment if use goes up over your usual need - Don't leave home without it !!  (think of it like the spare tire for your car)   Plan C = Crisis - only use your albuterol nebulizer if you first try Plan B and it fails to help > ok to use the nebulizer up to every 4 hours but if start needing it regularly call for immediate appointment   Plan D = Doctor - call me if B and C not adequate  Plan E = ER - go to ER or call 911 if all else fails     If you are satisfied with your treatment plan,  let your doctor know and he/she can either refill your medications or you can return here when your prescription runs out.     If in any way you are not 100% satisfied,  please tell us.  If 100% better, tell your friends!  Pulmonary follow up is as needed

## 2016-04-01 NOTE — Assessment & Plan Note (Addendum)
-   02/14/2014   try symbicort 160 2bid  - PFT's  02/14/2014   FEV1 1.82 (53%) and ratio 47 no better p alb (p spiriva and symbicort 160)  and DLCO 32 with 37%  07/2014 >Spiriva stopped  09/15/2014 Med calendar> not using 02/20/2015   - Quit smoking 10/2015  - flutter valve 04/01/2016   DDX of  difficult airways management almost all start with A and  include Adherence, Ace Inhibitors, Acid Reflux, Active Sinus Disease, Alpha 1 Antitripsin deficiency, Anxiety masquerading as Airways dz,  ABPA,  Allergy(esp in young), Aspiration (esp in elderly), Adverse effects of meds,  Active smokers, A bunch of PE's (a small clot burden can't cause this syndrome unless there is already severe underlying pulm or vascular dz with poor reserve) plus two Bs  = Bronchiectasis and Beta blocker use..and one C= CHF  Adherence is always the initial "prime suspect" and is a multilayered concern that requires a "trust but verify" approach in every patient - starting with knowing how to use medications, especially inhalers, correctly, keeping up with refills and understanding the fundamental difference between maintenance and prns vs those medications only taken for a very short course and then stopped and not refilled.  - not understanding timing of symb/tudorza vs saba, reviewed - not willing to sue med calendar format with action plan at bottom  - 04/01/2016  After extensive coaching HFA effectiveness =    90%  ? Acid (or non-acid) GERD > always difficult to exclude as up to 75% of pts in some series report no assoc GI/ Heartburn symptoms> rec continue max (24h)  acid suppression and diet restrictions/ reviewed   > ok to change to h2 which are less timing dep   ? Active smoking> >  I reviewed the Fletcher curve with the patient that basically indicates  if you quit smoking when your best day FEV1 is still well preserved (as is relatively still the case  here)  it is highly unlikely you will progress to severe disease and informed  the patient there was no medication on the market that has proven to alter the curve/ its downward trajectory  or the likelihood of progression of their disease.  Therefore stopping smoking and maintaining abstinence is the most important aspect of care, not choice of inhalers or for that matter, doctors.  ? Anxiety > usually at the bottom of this list of usual suspects but should be much higher on this pt's based on H and P and note already on psychotropics .   I had an extended discussion with the patient reviewing all relevant studies completed to date and  lasting 25 minutes of a 40  minute extended office visit  Addressing freq unnecessary ER utilization  Each maintenance medication was reviewed in detail including most importantly the difference between maintenance and prns and under what circumstances the prns are to be triggered using an action plan format that is not reflected in the computer generated alphabetically organized AVS using an ABCDE format (see avs)   Please see instructions for details which were reviewed in writing and the patient given a copy highlighting the part that I personally wrote and discussed at today's ov.

## 2016-04-01 NOTE — Progress Notes (Signed)
Subjective:    Patient ID: Tony Sullivan, male    DOB: 11/01/1960  MRN: [id number]    Brief patient profile:  62  yobm active smoker with breathing difficulties since 2004 dx as copd on advair 250 /50 but getting worse since 2014 and placed on 02 for the first time in March 2015 and referred 01/07/2014 to pulmonary clinic by Dr Kennon Holter with GOLD II copd criteria established 02/14/14    History of Present Illness  01/07/2014 1st [facility] office visit/ Tony Sullivan  Chief Complaint  Patient presents with  . Advice Only    Referred for COPD, SOB.  Pt c/o SOB, low 02, prod cough with white mucous.  Pt forgot med list, unsure of what all he is taking  still can do a [employer] but uses [facility] parking, assoc with congested coughing esp in am worse in winter. rec Stop advair   Plan A =  Automatic =  symbicort 160 Take 2 puffs first thing in am and then another 2 puffs about 12 hours later. spiriva in am only  Plan B = Backup- Proventil use it only if you can't your breath - ok to use up to every 4 hours if needed Plan C = Nebulizer up to every 4 hours if needed only if you try plan B first and it doesn't work   02/14/2014 f/u ov/Tony Sullivan re: copd GOLD II on ACEi and Advair and still smoking  Chief Complaint  Patient presents with  . Follow-up    Review pft.  Pt c/o prod cough with clear mucuos, SOB with exertion.  Interested in getting a portable 02 concentrator.    rec Plan A =  Automatic =  symbicort 160 Take 2 puffs first thing in am and then another 2 puffs about 12 hours later.                                       spiriva in am only  Plan B = Backup- Proventil use it only if you can't your breath - ok to use up to every 4 hours if needed Plan C = Nebulizer up to every 4 hours if needed only if you try plan B first and it doesn't work The key is to stop smoking completely before smoking completely stops you! 02 should be 3lpm with more than 100 ft walking  Please schedule a follow up visit  in 3 months but call sooner if needed    06/15/2014 f/u ov/Tony Sullivan re: still smoking/ GOLD II copd 02 dep with ambulation / on spiriva/symbicort Chief Complaint  Patient presents with  . Follow-up    Pt reports that cough and SOB has been more increased recently d/t weather change. Using 2.5Liter O2  No need for saba yet in any form / dry hacking cough  rec Stop lisinopril Start valsartan 160 mg one daily  Please see patient coordinator before you leave today  to schedule ambulatory portable system that will deliver  02 at 2.5 lpm to replace your tank     09/15/2014 Follow up and Med Calendar GOLD II COPD s/p exac to ER / still smoking on symbicort and spiriva  Patient returns for a follow-up and medication review. We reviewed all his medications organize them into a medication count with patient education It appears that he is taking his medications correctly but is out , has follow up  with PCP for refills.  Has cut back on smoking .  Doing well w/ no flare of cough or wheezing/dyspnea.  Last visit spiriva was stopped, no flare off inhaler.  Needs refill on omeprazole , Zyrtec and proventil  rec Follow med calendar closely and bring to each visit. Continue current regimen. Continue to work on not smoking.    02/20/2015 f/u ov/Tony Sullivan re: GOLD II / no med calendar/ no meds / still smoking  Chief Complaint  Patient presents with  . Follow-up    Pt c/o of SOB with excertion, wheezing, dry cough. Pt stopped spirvia, and uses nebs and albuterol prn.  no real change doe/ MMRC Grad 1/2 minimal variability  better on 3lpm when he uses it Easily confused with details of care and what he is vs not taking vs has run out - using multiple doses of saba in hfa and neb form more day than noct / saba helps some  >change o2 As needed  With act   03/22/2015 Follow up : COPD /GOLD II  Pt returns for 1 month follow up .  Says he still gets winded easily.  Has cough on/off. No hemoptysis , chest pain  or orthopnea.  Remains on Symbicort Twice daily  .  Remains on  O2 with activity. Does still wear at bedtime .  Uses rescue inhaler most days.  Still smoking . Discussed cessation.  rec Continue on Symbicort 2 puffs Twice daily  , rinse after use.  Begin Tudorza 1 puff Twice daily  , rinse after use.  Work on not smoking   07/31/2015  f/u ov/Tony Sullivan re: GOLD II/ still smoking / over use of saba daily  Chief Complaint  Patient presents with  . Follow-up    Pt reports breathing is fair. C/o prod cough (green phlem x last week), wheezing, chest tx in early AM.    gets panicky when walking outdoors  and uses lots of nebs since doesn't have an hfa saba  Doe = MMRC2 = can't walk a nl pace on a flat grade s sob/ no worse with cough worse this past week  rec Prevnar 13 and flu shots today Plan A =  Automatic =  symbicort 160 Take 2 puffs first thing in am and then another 2 puffs about 12 hours later and tudorza one twice daily as well                Plan B = Backup- Proair use it only if you can't your breath - ok to use up to 2 pffs  every 4 hours if needed Plan C = Nebulizer up to every 4 hours if needed only if you try plan B first and it doesn't work The key is to stop smoking completely before smoking completely stops you! No need for 02 at bedtime or with nl activities, use 3lpm with heavy exertion as needed  Please see patient coordinator before you leave today  to schedule pulmonary rehab > declined     12/05/2015  f/u ov/Tony Sullivan re: GOLD II / wears 02 2lpm at hs and when walks to store  Chief Complaint  Patient presents with  . Acute Visit    Pt c/o CP for the past 2 months- comes and goes. He states he has burning in the center of his chest and sharp pain on the left. He also c/o more SOB than usual and has been using proair 4-6 x per day on average.   no proair  today / MMRC2 = can't walk a nl pace on a flat grade s sob does ok flat on 2lpm to store  rec Stop omeprazole and take pepcid  20 mg after breakfast and supper  Avoid  foods that cause gas (especially beans boiled eggs/and raw vegetables like spinach and salads)  and citrucel 1 heaping tsp twice daily with a large glass of water.  Pain should improve w/in 2 weeks  Congratulations on not smoking, it's the most important aspect of your care  No need for 02 except for walks over 200 ft > use 2lpm while walking but pace yourself Only use your albuterol as a rescue medication      04/01/2016  Extended f/u ov/Hardeep Reetz re: copd gold II/ 02 prn / cp resolved/ multiple er trips for copd Chief Complaint  Patient presents with  . Follow-up    Breathing is unchanged. He c/o congestion today and has minimal cough with clear sputum. He is using proair inhaler and neb both approx 4 x per day on average.   way over using  saba esp in am with lots of congestion/rattling up to an hour and doesn't get around to using symb for hours / not timing ppi ac as rec    Never cp before 2 months and constantly aware of midline burning sensation since s any fluctuation  smidge better p gi cocktail  Does not wake him at night / different pain LUQ never while sleep 2-5 minutes   No obvious day to day or daytime variability or assoc excess/ purulent sputum or mucus plugs    chest tightness, subjective wheeze or overt sinus or hb symptoms. No unusual exp hx or h/o childhood pna/ asthma or knowledge of premature birth.  Sleeping ok without nocturnal  or early am exacerbation  of respiratory  c/o's or need for noct saba. Also denies any obvious fluctuation of symptoms with weather or environmental changes or other aggravating or alleviating factors except as outlined above   Current Medications, Allergies, Complete Past Medical History, Past Surgical History, Family History, and Social History were reviewed in [employer] record.  ROS  The following are not active complaints unless bolded sore throat, dysphagia, dental problems,  itching, sneezing,  nasal congestion or excess/ purulent secretions, ear ache,   fever, chills, sweats, unintended wt loss, classically pleuritic or exertional cp, hemoptysis,  orthopnea pnd or leg swelling, presyncope, palpitations, abdominal pain, anorexia, nausea, vomiting, diarrhea  or change in bowel or bladder habits, change in stools or urine, dysuria,hematuria,  rash, arthralgias, visual complaints, headache, numbness, weakness or ataxia or problems with walking or coordination,  change in mood/affect or memory.              Objective:   Physical Exam   06/15/2014  164  >  09/15/2014 158 >   02/20/2015 >156 03/22/2015 >  07/31/2015   150 > 12/05/2015  161   > 04/01/2016    151   amb anxious mild hoarse bm nad  / congested rattling cough  Vital signs reviewed/ note sats 98% RA on arrival   HEENT mild turbinate edema.  Oropharynx no thrush or excess pnd or cobblestoning.  No JVD or cervical adenopathy. Mild accessory muscle hypertrophy. Trachea midline, nl thryroid. Chest was hyperinflated by percussion with diminished breath sounds and moderate increased exp time without wheeze. Hoover sign positive at mid inspiration. Regular rate and rhythm without murmur gallop or rub or increase P2 or edema.  Abd:  no hsm, nl excursion. Ext warm without cyanosis- mod bilateral clubbing     I personally reviewed images and agree with radiology impression as follows:  CXR:  02/29/16 COPD without acute abnormality.            Assessment & Plan:

## 2016-04-04 NOTE — Assessment & Plan Note (Signed)
-   started on 27 December 2013 by [facility] clinic - 02/14/2014  Walked RA x 3/4  laps @ 185 ft each stopped due to  desat 85% and corrected on 3lpm to x 2 laps  - 06/15/2014  Walked 2.5lpm  2 laps  @ 185 ft each stopped due to  Sob adequate sats  - 02/20/2015  Walked RA x 3 laps @ 185 ft each stopped due to  Sob/desat p 2 laps, nl pace, eliminated on his 02 at 3lpm  - 12/05/2015   Walked RA  2 laps @ 185 ft each stopped due to  desat to 88% and corrected on 2lpm   rec as of 04/01/2016 =  2lpm walking more than 200 ft and 2lpm hs

## 2016-04-08 ENCOUNTER — Encounter: Payer: Self-pay | Admitting: Gastroenterology

## 2016-04-26 ENCOUNTER — Emergency Department (HOSPITAL_COMMUNITY)
Admission: EM | Admit: 2016-04-26 | Discharge: 2016-04-26 | Disposition: A | Discharge status: Home / Self Care | Payer: Medicaid Other | Attending: Emergency Medicine | Admitting: Emergency Medicine

## 2016-04-26 ENCOUNTER — Encounter (HOSPITAL_COMMUNITY): Payer: Self-pay | Admitting: *Deleted

## 2016-04-26 ENCOUNTER — Emergency Department (HOSPITAL_COMMUNITY): Payer: Medicaid Other

## 2016-04-26 DIAGNOSIS — Z7951 Long term (current) use of inhaled steroids: Secondary | ICD-10-CM | POA: Insufficient documentation

## 2016-04-26 DIAGNOSIS — Z791 Long term (current) use of non-steroidal anti-inflammatories (NSAID): Secondary | ICD-10-CM | POA: Diagnosis not present

## 2016-04-26 DIAGNOSIS — J441 Chronic obstructive pulmonary disease with (acute) exacerbation: Secondary | ICD-10-CM | POA: Insufficient documentation

## 2016-04-26 DIAGNOSIS — Z79899 Other long term (current) drug therapy: Secondary | ICD-10-CM | POA: Insufficient documentation

## 2016-04-26 DIAGNOSIS — J45909 Unspecified asthma, uncomplicated: Secondary | ICD-10-CM | POA: Insufficient documentation

## 2016-04-26 DIAGNOSIS — R0602 Shortness of breath: Secondary | ICD-10-CM | POA: Diagnosis present

## 2016-04-26 DIAGNOSIS — Z87891 Personal history of nicotine dependence: Secondary | ICD-10-CM | POA: Diagnosis not present

## 2016-04-26 LAB — BASIC METABOLIC PANEL
Anion gap: 9 (ref 5–15)
BUN: 15 mg/dL (ref 6–20)
CO2: 22 mmol/L (ref 22–32)
Calcium: 9.3 mg/dL (ref 8.9–10.3)
Chloride: 108 mmol/L (ref 101–111)
Creatinine, Ser: 0.92 mg/dL (ref 0.61–1.24)
GFR calc Af Amer: >60 mL/min (ref >60)
GFR calc non Af Amer: >60 mL/min (ref >60)
Glucose, Bld: 143 mg/dL — ABNORMAL HIGH (ref 65–99)
Potassium: 3.2 mmol/L — ABNORMAL LOW (ref 3.5–5.1)
Sodium: 139 mmol/L (ref 135–145)

## 2016-04-26 LAB — CBC
HCT: 39 % (ref 39.0–52.0)
Hemoglobin: 13.8 g/dL (ref 13.0–17.0)
MCH: 33.1 pg (ref 26.0–34.0)
MCHC: 35.4 g/dL (ref 30.0–36.0)
MCV: 93.5 fL (ref 78.0–100.0)
Platelets: 308 10*3/uL (ref 150–400)
RBC: 4.17 MIL/uL — ABNORMAL LOW (ref 4.22–5.81)
RDW: 14 % (ref 11.5–15.5)
WBC: 23.4 10*3/uL — ABNORMAL HIGH (ref 4.0–10.5)

## 2016-04-26 LAB — I-STAT TROPONIN, ED: Troponin i, poc: 0.01 ng/mL (ref 0.00–0.08)

## 2016-04-26 IMAGING — CR DG CHEST 2V
2 series · 2 of 2 positions shown · non-contrast
Comparison: [DATE]

CLINICAL DATA: Short of breath and COPD

EXAM:
CHEST  2 VIEW

[w chest pa]
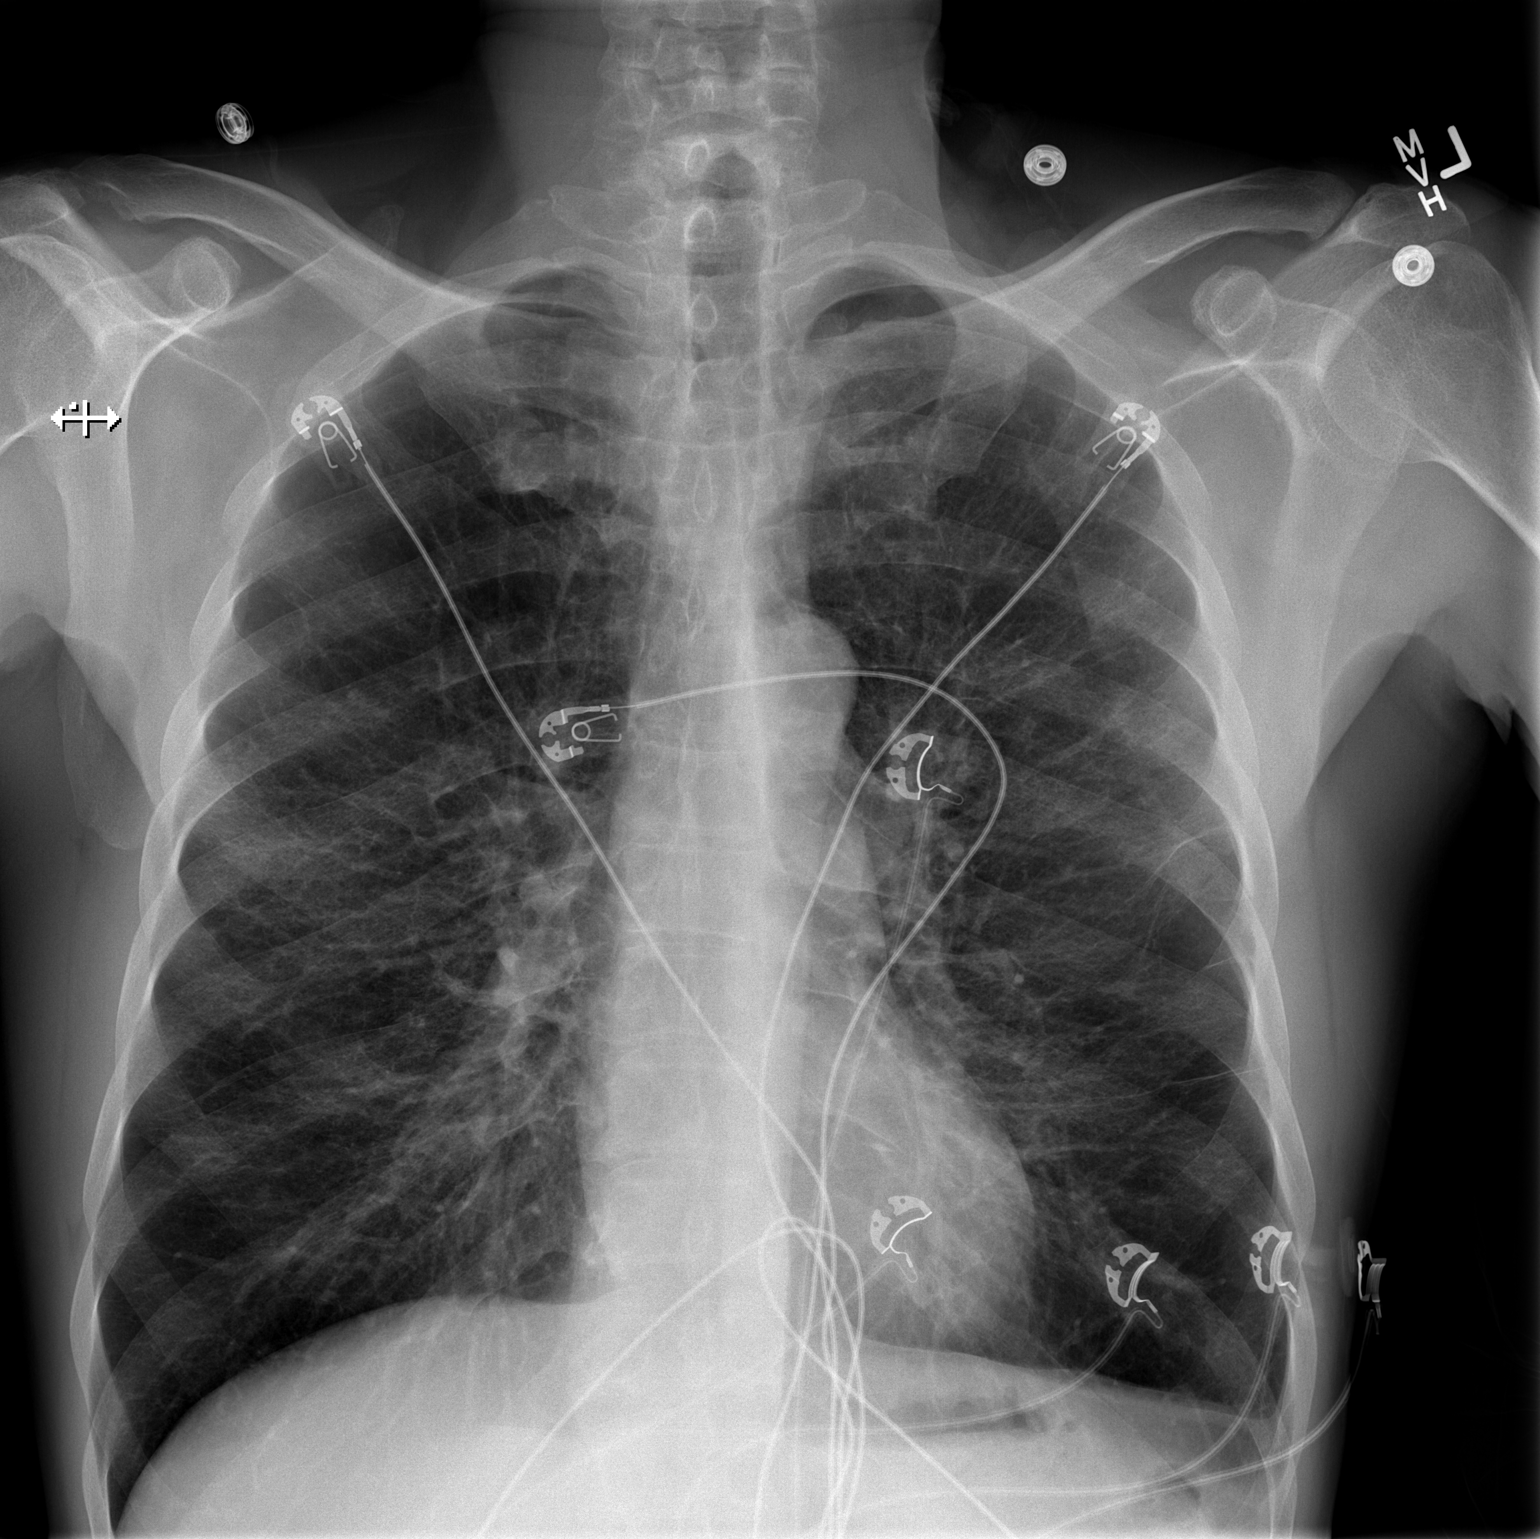

[w chest lat]
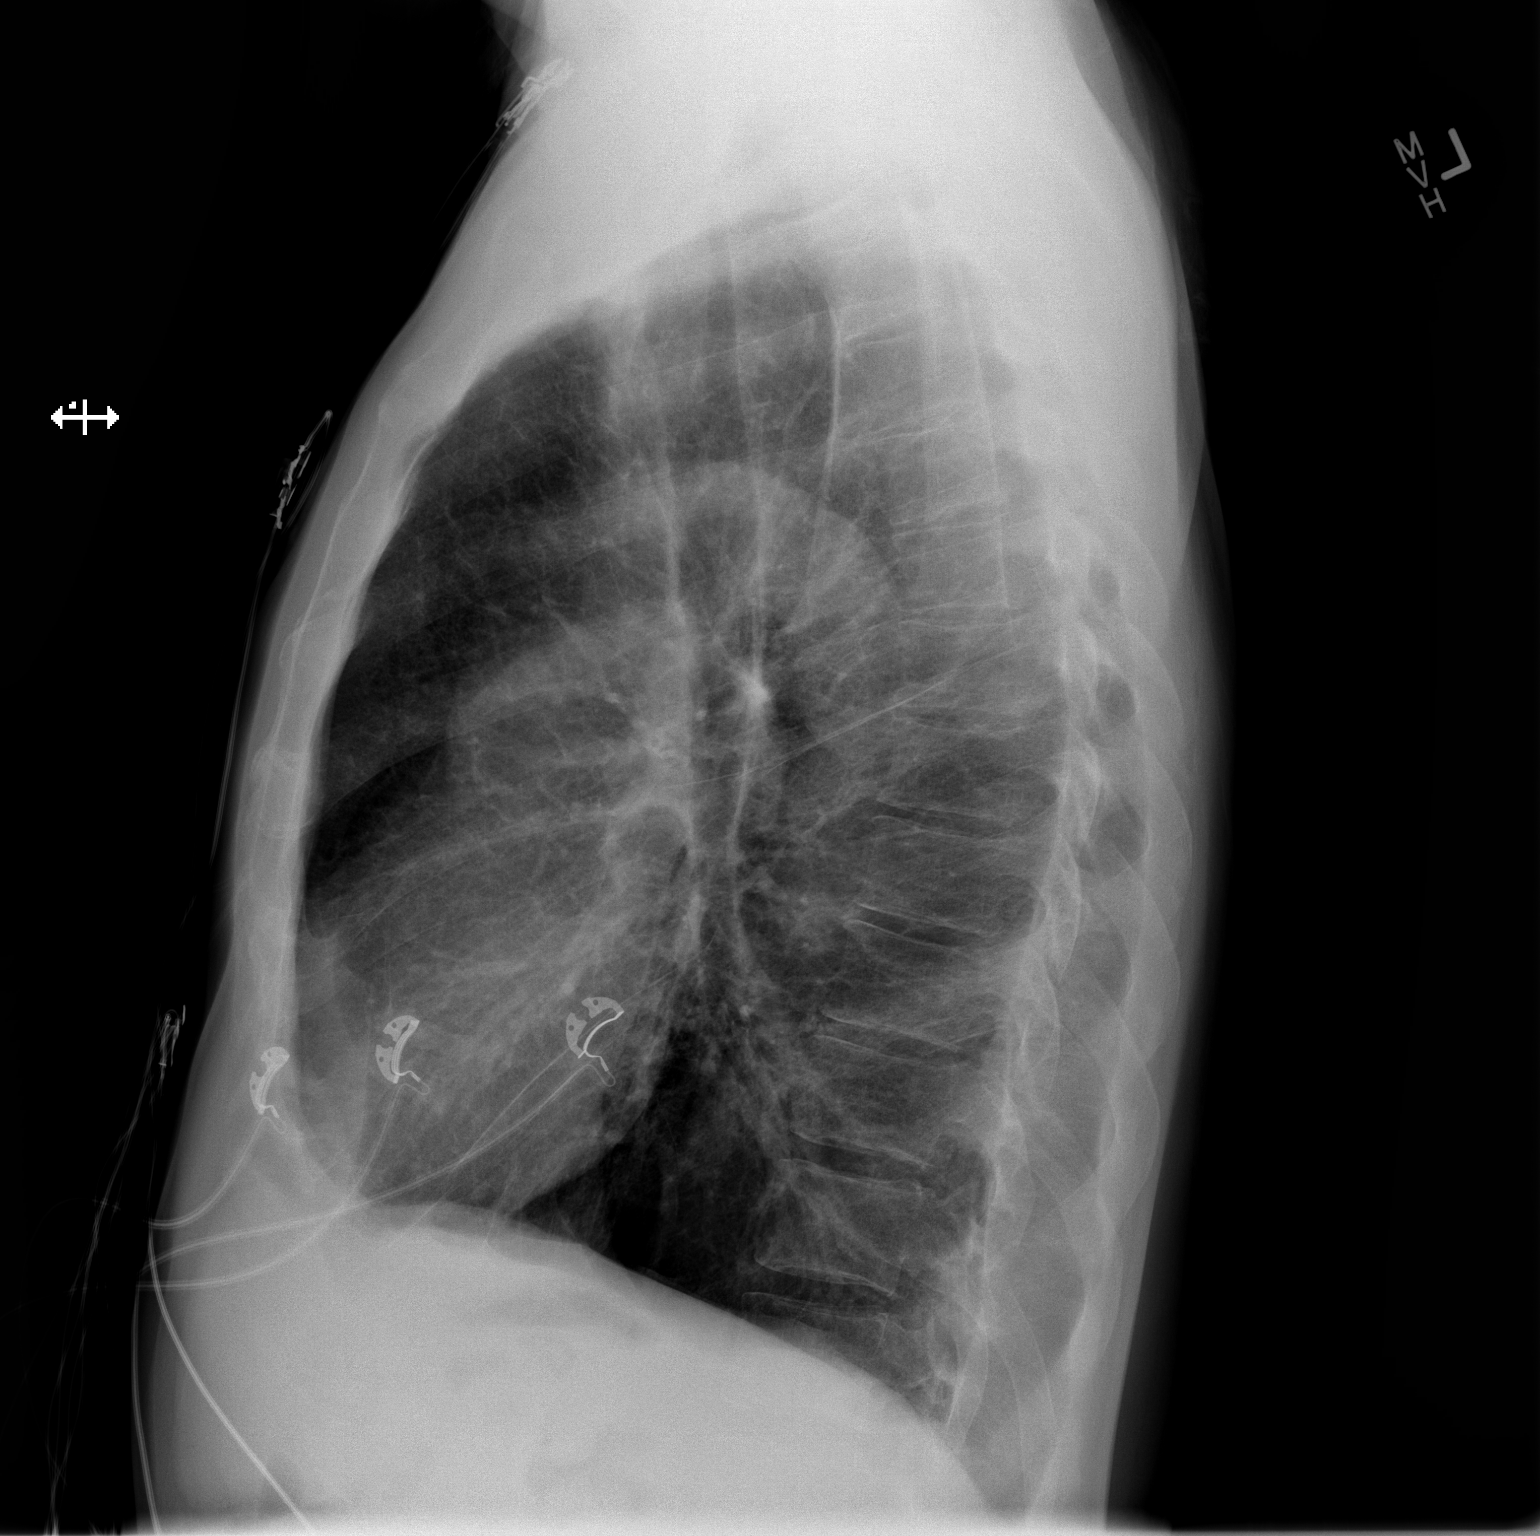

[2 of 2 positions shown; findings below may reference images not displayed]

FINDINGS: COPD with pulmonary emphysema and pulmonary hyperinflation unchanged
from the prior study. Negative for pneumonia. Heart size normal.
Negative for heart failure. Negative for mass or adenopathy. No
pleural effusion. Mild blunting left costophrenic angle consistent
with scarring
IMPRESSION: Moderate to severe COPD.  No superimposed acute abnormality.

## 2016-04-26 MED ORDER — IPRATROPIUM-ALBUTEROL 0.5-2.5 (3) MG/3ML IN SOLN
RESPIRATORY_TRACT | Status: AC
  Administered 2016-04-26: 3 mL
  Filled 2016-04-26: qty 3

## 2016-04-26 MED ORDER — PREDNISONE 10 MG PO TABS
40.0000 mg | ORAL_TABLET | Freq: Every day | ORAL | Status: AC
Start: 2016-04-26 — End: 2016-04-30

## 2016-04-26 MED ORDER — HYDROCODONE-ACETAMINOPHEN 5-325 MG PO TABS
1.0000 | ORAL_TABLET | Freq: Once | ORAL | Status: AC
  Administered 2016-04-26: 1 via ORAL
  Filled 2016-04-26: qty 1

## 2016-04-26 MED ORDER — POTASSIUM CHLORIDE CRYS ER 20 MEQ PO TBCR
40.0000 meq | EXTENDED_RELEASE_TABLET | Freq: Once | ORAL | Status: AC
  Administered 2016-04-26: 40 meq via ORAL
  Filled 2016-04-26: qty 2

## 2016-04-26 MED ORDER — METHYLPREDNISOLONE SODIUM SUCC 125 MG IJ SOLR
125.0000 mg | Freq: Once | INTRAMUSCULAR | Status: AC
  Administered 2016-04-26: 125 mg via INTRAVENOUS
  Filled 2016-04-26: qty 2

## 2016-04-26 MED ORDER — ALBUTEROL SULFATE (2.5 MG/3ML) 0.083% IN NEBU
5.0000 mg | INHALATION_SOLUTION | Freq: Once | RESPIRATORY_TRACT | Status: AC
  Administered 2016-04-26: 5 mg via RESPIRATORY_TRACT
  Filled 2016-04-26: qty 6

## 2016-04-26 NOTE — ED Provider Notes (Signed)
CSN: [id number]     Arrival date & time 04/26/16  1600 History   First MD Initiated Contact with Patient 04/26/16 1618     Chief Complaint  Patient presents with  . Shortness of Breath     (Consider location/radiation/quality/duration/timing/severity/associated sxs/prior Treatment) Patient is a 55 y.o. male presenting with shortness of breath.  Shortness of Breath Severity:  Severe Onset quality:  Sudden (woke from nap feeling short of breath) Timing:  Constant Progression:  Improving Chronicity:  Recurrent Context: URI (runny nose yesterday) and weather changes (sleeping in heat)   Relieved by: nebulizers with EMS. Ineffective treatments:  Inhaler Associated symptoms: cough and wheezing   Associated symptoms: no abdominal pain, no chest pain, no diaphoresis, no ear pain, no fever, no headaches, no rash, no sore throat and no vomiting   Risk factors: no family hx of DVT, no hx of PE/DVT and no tobacco use     Past Medical History  Diagnosis Date  . High blood pressure   . COPD (chronic obstructive pulmonary disease) ([place]) 2004    diagnosed in 2004, no PFT's to date.  Started on home O2 12/2013, after found to be desatting at PCP's office, and referred to pulmonology.  . Seasonal allergies   . Asthma    Past Surgical History  Procedure Laterality Date  . Appendectomy  1980  . Hip surgery Right   . Shoulder arthroscopy Right    Family History  Problem Relation Age of Onset  . Emphysema Mother   . Emphysema Father   . Allergies Mother     "everyone in family"  . Allergies Father   . Allergies Son   . Asthma Mother     "everyone in family"  . Heart disease Mother   . Heart disease Maternal Aunt   . Clotting disorder Mother   . Cancer Mother    Social History  Substance Use Topics  . Smoking status: Former Smoker -- 1.00 packs/day for 36 years    Types: Cigarettes    Quit date: 11/04/2015  . Smokeless tobacco: Never Used  . Alcohol Use: 8.4 - 12.6 oz/week   14-21 Standard drinks or equivalent per week     Comment: 2-3 beers/day, with occasional "binges".  No history of withdrawal    Review of Systems  Constitutional: Negative for fever and diaphoresis.  HENT: Positive for congestion (beginning yesterday). Negative for ear pain and sore throat.   Eyes: Negative for visual disturbance.  Respiratory: Positive for cough, chest tightness, shortness of breath and wheezing.   Cardiovascular: Negative for chest pain.  Gastrointestinal: Negative for nausea, vomiting, abdominal pain and diarrhea.  Genitourinary: Negative for dysuria and difficulty urinating (no loss control bowel or bladder).  Musculoskeletal: Positive for back pain and arthralgias (right hip, no trauma, no fevers). Negative for neck stiffness.  Skin: Negative for rash.  Neurological: Negative for syncope, weakness, numbness and headaches.      Allergies  Penicillins; Sulfa antibiotics; Famotidine; and Levocetirizine  Home Medications   Prior to Admission medications   Medication Sig Start Date End Date Taking? Authorizing Provider  Aclidinium Bromide (TUDORZA PRESSAIR) 400 MCG/ACT AEPB Inhale 1 puff into the lungs 2 (two) times daily. 11/13/15  Yes [name], MD  albuterol (PROAIR HFA) 108 (90 BASE) MCG/ACT inhaler 2 puffs up to every 4 hours if needed for short of breath Patient taking differently: Inhale 2 puffs into the lungs every 4 (four) hours as needed for wheezing or shortness of breath.  07/31/15  Yes [name], MD  albuterol (PROVENTIL) (2.5 MG/3ML) 0.083% nebulizer solution Take 2.5 mg by nebulization every 4 (four) hours as needed for wheezing or shortness of breath.    Yes Historical Provider, MD  amLODipine (NORVASC) 10 MG tablet Take 10 mg by mouth every morning.    Yes Historical Provider, MD  benztropine (COGENTIN) 0.5 MG tablet Take 0.5 mg by mouth at bedtime.    Yes Historical Provider, MD  budesonide-formoterol (SYMBICORT) 160-4.5 MCG/ACT inhaler  Inhale 2 puffs into the lungs 2 (two) times daily. 04/05/15  Yes Tammy S Parrett, NP  cetirizine (ZYRTEC) 10 MG tablet Take 10 mg by mouth daily.   Yes Historical Provider, MD  fluticasone (FLONASE) 50 MCG/ACT nasal spray Place 2 sprays into both nostrils 3 (three) times daily as needed for allergies.    Yes Historical Provider, MD  loxapine (LOXITANE) 10 MG capsule Take 10 mg by mouth at bedtime.    Yes Historical Provider, MD  megestrol (MEGACE) 40 MG tablet Take 40 mg by mouth daily.   Yes Historical Provider, MD  meloxicam (MOBIC) 7.5 MG tablet Take 7.5 mg by mouth 2 (two) times daily.    Yes Historical Provider, MD  mirtazapine (REMERON) 30 MG tablet Take 30 mg by mouth at bedtime.   Yes Historical Provider, MD  Multiple Vitamin (MULTIVITAMIN) capsule Take 1 capsule by mouth daily.   Yes Historical Provider, MD  OXYGEN Inhale 2-3 L into the lungs as directed. 2-3 liters with sleep and exertion as needed for low oxygen   Yes Historical Provider, MD  Respiratory Therapy Supplies (FLUTTER) DEVI Use as directed 04/01/16  Yes [name], MD  risperiDONE (RISPERDAL) 2 MG tablet Take 2 mg by mouth at bedtime.   Yes Historical Provider, MD  valsartan (DIOVAN) 160 MG tablet Take 1 tablet (160 mg total) by mouth daily. 08/30/14  Yes [name], MD  famotidine (PEPCID) 20 MG tablet One after bfast and supper Patient not taking: Reported on 04/26/2016 04/01/16   [name], MD  predniSONE (DELTASONE) 10 MG tablet Take 4 tablets (40 mg total) by mouth daily. 04/26/16 04/30/16  Gareth Morgan, MD   BP 124/83 mmHg  Pulse 96  Temp(Src) 99.1 F (37.3 C) (Oral)  Resp 23  SpO2 95% Physical Exam  Constitutional: He is oriented to person, place, and time. He appears well-developed and well-nourished. No distress.  HENT:  Head: Normocephalic and atraumatic.  Eyes: Conjunctivae and EOM are normal.  Neck: Normal range of motion.  Cardiovascular: Normal rate, regular rhythm, normal heart sounds and intact  distal pulses.  Exam reveals no gallop and no friction rub.   No murmur heard. Pulmonary/Chest: Effort normal. No respiratory distress. He has decreased breath sounds (throughout). He has wheezes (occasional end exp wheeze). He has no rales.  Abdominal: Soft. He exhibits no distension. There is no tenderness. There is no guarding.  Musculoskeletal: He exhibits no edema.  Neurological: He is alert and oriented to person, place, and time.  Skin: Skin is warm and dry. He is not diaphoretic.  Nursing note and vitals reviewed.   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 Negative for pneumonia. Heart size normal. Negative for heart failure. Negative for mass or adenopathy. No pleural effusion. Mild blunting left costophrenic angle consistent with scarring IMPRESSION: Moderate to severe COPD.  No superimposed acute abnormality. Electronically Signed   By: Franchot Gallo M.D.   On: 04/26/2016 19:03   I have personally reviewed and evaluated these images and lab results as part of my medical decision-making.   EKG Interpretation   Date/Time:  Friday April 26 2016 16:14:47 EDT Ventricular Rate:  98 PR Interval:    QRS Duration: 78 QT Interval:  342 QTC Calculation: 437 R Axis:   77 Text Interpretation:  Sinus rhythm Biatrial enlargement Probable  anteroseptal infarct, old No significant change since last tracing  Confirmed by  [facility] MD, [place] ([id number]) on 04/26/2016 4:46:38 PM      MDM   Final diagnoses:  COPD exacerbation ([place])   55 year old male with a history of htn, COPD,who presents with concern of shortness of breath.  Differential diagnosis for dyspnea includes ACS, PE, COPD exacerbation, CHF exacerbation, anemia, pneumonia.  Chest x-ray was done which showed no sign of pneumonia. EKG was evaluated by me which showed no significant changes from prior.  Reports similar tightness he feels with COPD, no other CP. Single troponin negative and doubt ACS.  Patient significantly improved following do nabs and Solu-Medrol. Has better air movement on exam. Saturations improved to normal on patient's home 2L.  No pulmonary edema, no leg swelling, no hx of CHF.  Given significant improvement with bronchodilators, feel COPD exacerbation is most likely etiology of patient symptoms. Episode was likely brought on by viral syndrome, and weather changes. Patient has a leukocytosis which is nonspecific, and denies any other infectious symptoms.Will discharge home with four days a prednisone and recommendation for PCP follow up.  Gareth Morgan, MD 04/27/16 1221

## 2016-04-26 NOTE — ED Notes (Signed)
Pt c/o pain to R hip radiating to R ankle, pt describes as cramping 3/10 Pt requesting crackers or something to eat. Troponin test explained to patient and family.

## 2016-04-26 NOTE — ED Notes (Addendum)
EMS reports pt was shob, wife called EMS, pt has history of COPD, woke at 47 with severe SHOB, unable to speak sentences, RA 90%, two Albuterol inhaler doses, Albuterol neb treatment given by EMS, now has exp wheezing. Now can talk in complete sentences.  [phone]

## 2016-05-06 ENCOUNTER — Ambulatory Visit: Payer: Medicaid Other | Admitting: Gastroenterology

## 2016-05-20 ENCOUNTER — Other Ambulatory Visit: Payer: Self-pay | Admitting: Internal Medicine

## 2016-06-17 ENCOUNTER — Emergency Department (HOSPITAL_COMMUNITY): Payer: Medicaid Other

## 2016-06-17 ENCOUNTER — Encounter (HOSPITAL_COMMUNITY): Payer: Self-pay

## 2016-06-17 ENCOUNTER — Emergency Department (HOSPITAL_COMMUNITY)
Admission: EM | Admit: 2016-06-17 | Discharge: 2016-06-17 | Disposition: A | Discharge status: Home / Self Care | Payer: Medicaid Other | Attending: Emergency Medicine | Admitting: Emergency Medicine

## 2016-06-17 DIAGNOSIS — R0602 Shortness of breath: Secondary | ICD-10-CM | POA: Diagnosis not present

## 2016-06-17 DIAGNOSIS — J020 Streptococcal pharyngitis: Secondary | ICD-10-CM | POA: Insufficient documentation

## 2016-06-17 DIAGNOSIS — Z87891 Personal history of nicotine dependence: Secondary | ICD-10-CM | POA: Insufficient documentation

## 2016-06-17 DIAGNOSIS — J449 Chronic obstructive pulmonary disease, unspecified: Secondary | ICD-10-CM | POA: Diagnosis not present

## 2016-06-17 DIAGNOSIS — I10 Essential (primary) hypertension: Secondary | ICD-10-CM | POA: Diagnosis not present

## 2016-06-17 DIAGNOSIS — Z79899 Other long term (current) drug therapy: Secondary | ICD-10-CM | POA: Insufficient documentation

## 2016-06-17 DIAGNOSIS — R05 Cough: Secondary | ICD-10-CM | POA: Diagnosis present

## 2016-06-17 LAB — CBC WITH DIFFERENTIAL/PLATELET
Basophils Absolute: 0 10*3/uL (ref 0.0–0.1)
Basophils Relative: 0 %
Eosinophils Absolute: 0.3 10*3/uL (ref 0.0–0.7)
Eosinophils Relative: 3 %
HCT: 39.4 % (ref 39.0–52.0)
Hemoglobin: 12.5 g/dL — ABNORMAL LOW (ref 13.0–17.0)
Lymphocytes Relative: 20 %
Lymphs Abs: 2.3 10*3/uL (ref 0.7–4.0)
MCH: 31.6 pg (ref 26.0–34.0)
MCHC: 31.7 g/dL (ref 30.0–36.0)
MCV: 99.7 fL (ref 78.0–100.0)
Monocytes Absolute: 1.1 10*3/uL — ABNORMAL HIGH (ref 0.1–1.0)
Monocytes Relative: 10 %
Neutro Abs: 7.9 10*3/uL — ABNORMAL HIGH (ref 1.7–7.7)
Neutrophils Relative %: 67 %
Platelets: 264 10*3/uL (ref 150–400)
RBC: 3.95 MIL/uL — ABNORMAL LOW (ref 4.22–5.81)
RDW: 13.1 % (ref 11.5–15.5)
WBC: 11.7 10*3/uL — ABNORMAL HIGH (ref 4.0–10.5)

## 2016-06-17 LAB — BASIC METABOLIC PANEL
Anion gap: 6 (ref 5–15)
BUN: 11 mg/dL (ref 6–20)
CO2: 25 mmol/L (ref 22–32)
Calcium: 8.5 mg/dL — ABNORMAL LOW (ref 8.9–10.3)
Chloride: 111 mmol/L (ref 101–111)
Creatinine, Ser: 0.86 mg/dL (ref 0.61–1.24)
GFR calc Af Amer: >60 mL/min (ref >60)
GFR calc non Af Amer: >60 mL/min (ref >60)
Glucose, Bld: 96 mg/dL (ref 65–99)
Potassium: 3.6 mmol/L (ref 3.5–5.1)
Sodium: 142 mmol/L (ref 135–145)

## 2016-06-17 LAB — I-STAT TROPONIN, ED: Troponin i, poc: 0 ng/mL (ref 0.00–0.08)

## 2016-06-17 LAB — BRAIN NATRIURETIC PEPTIDE: B Natriuretic Peptide: 31.4 pg/mL (ref 0.0–100.0)

## 2016-06-17 LAB — RAPID STREP SCREEN (MED CTR MEBANE ONLY): Streptococcus, Group A Screen (Direct): POSITIVE — AB

## 2016-06-17 IMAGING — DX DG CHEST 2V
2 series · 2 of 2 positions shown · non-contrast
Comparison: [DATE]

CLINICAL DATA: Shortness of breath, cough and chest pain.

EXAM:
CHEST  2 VIEW

[w chest pa]
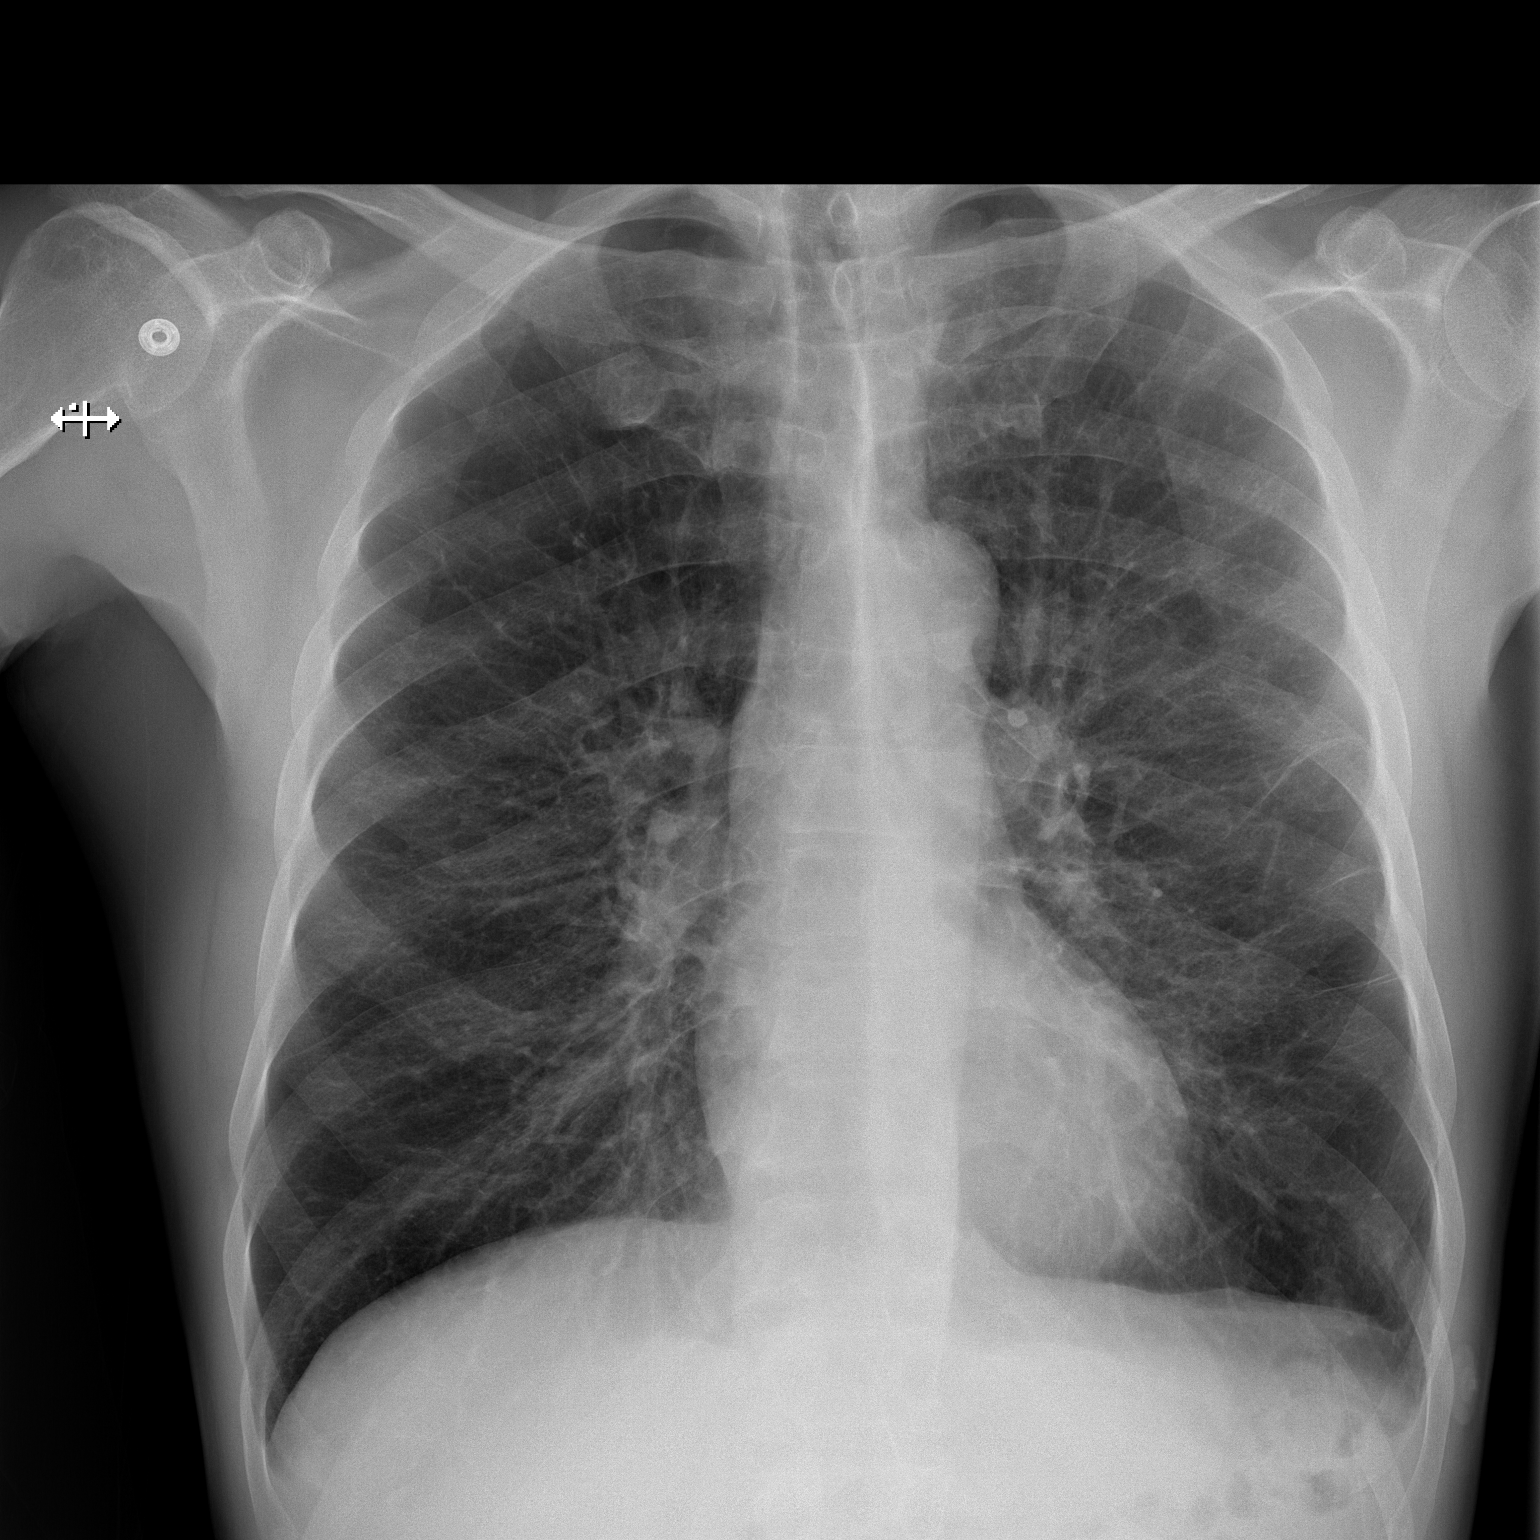

[w chest lat]
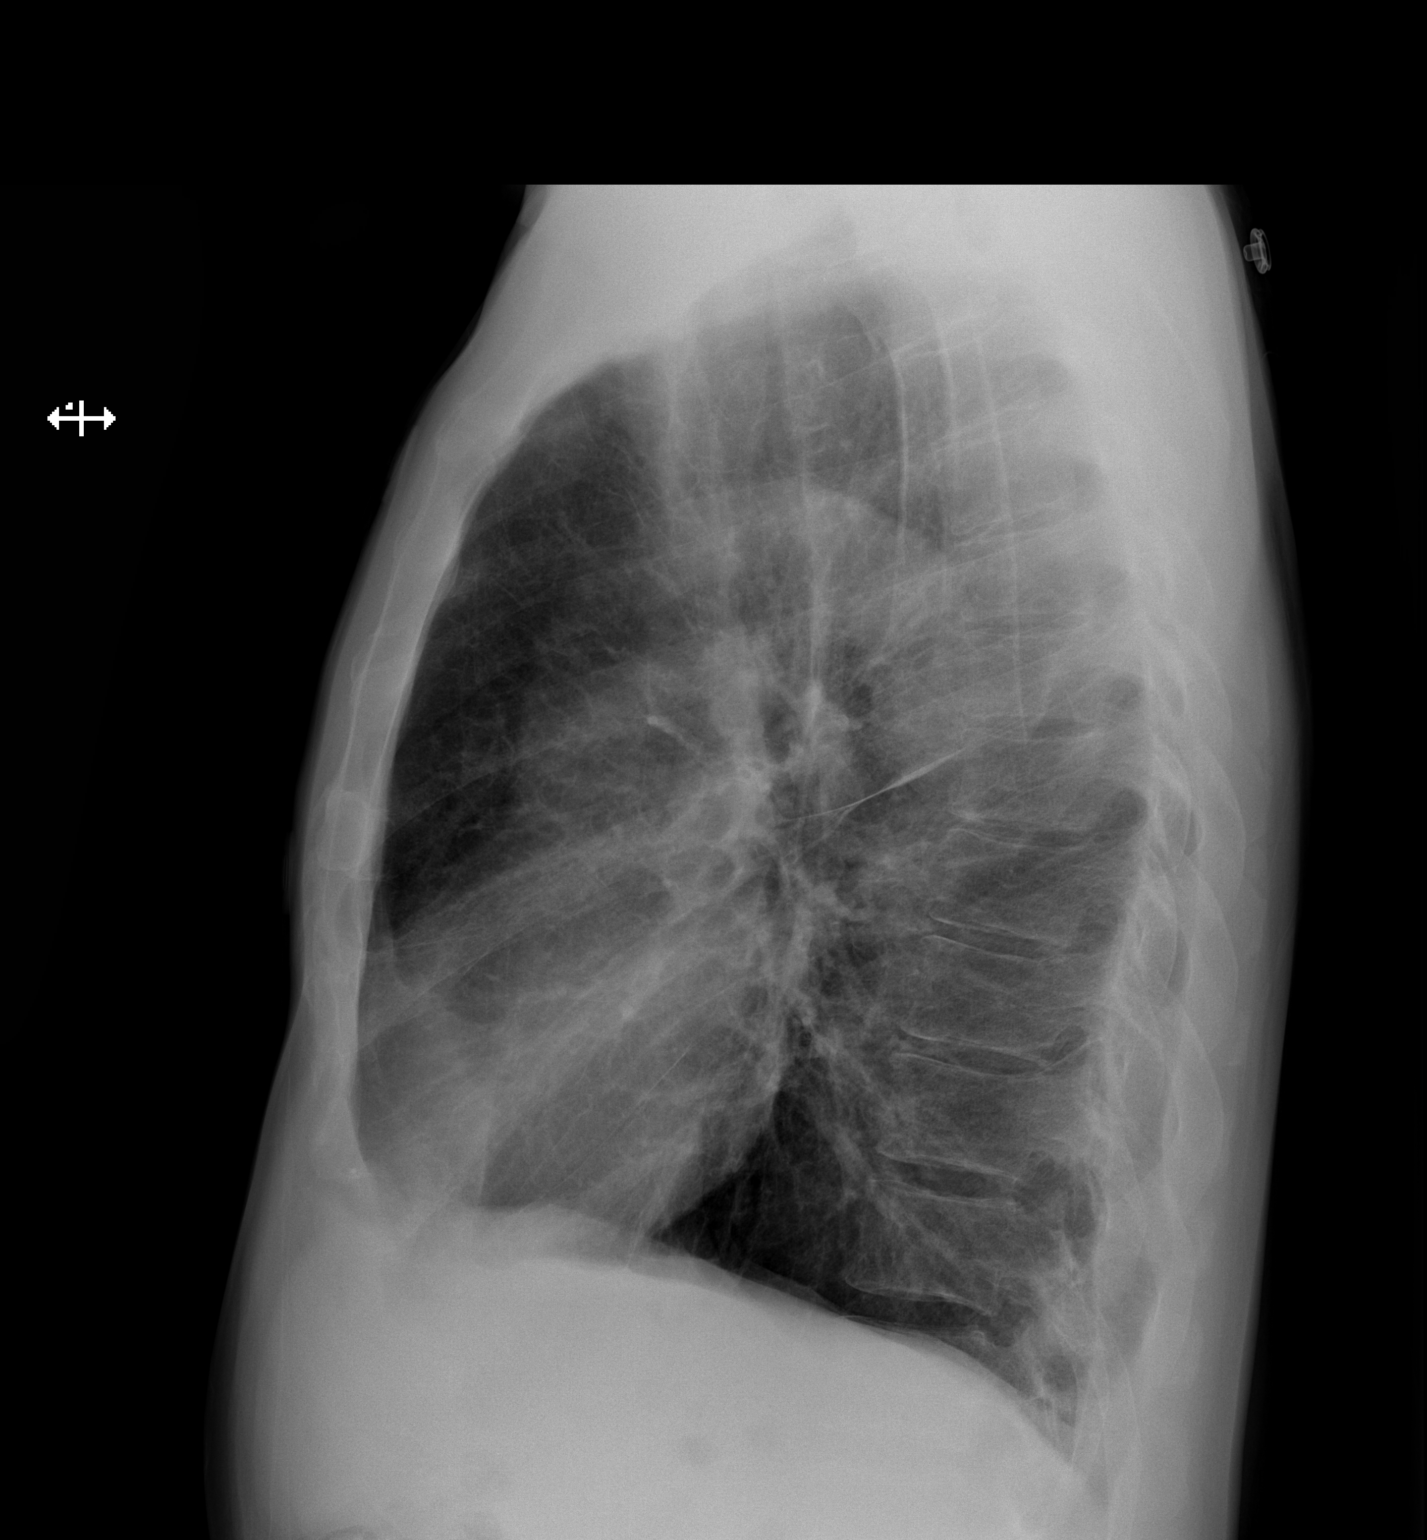

[2 of 2 positions shown; findings below may reference images not displayed]

FINDINGS: Stable chronic lung disease with bilateral parenchymal scarring and
emphysematous changes present. There is no evidence of pulmonary
edema, consolidation, pneumothorax, nodule or pleural fluid. The
heart size and mediastinal contours are normal. The bony thorax is
unremarkable.
IMPRESSION: Stable chronic lung disease.  No acute findings.

## 2016-06-17 MED ORDER — AZITHROMYCIN 500 MG PO TABS
500.0000 mg | ORAL_TABLET | Freq: Every day | ORAL | 0 refills | Status: DC
Start: 2016-06-17 — End: 2016-07-29

## 2016-06-17 MED ORDER — IPRATROPIUM-ALBUTEROL 0.5-2.5 (3) MG/3ML IN SOLN
3.0000 mL | Freq: Once | RESPIRATORY_TRACT | Status: AC
  Administered 2016-06-17: 3 mL via RESPIRATORY_TRACT
  Filled 2016-06-17: qty 3

## 2016-06-17 MED ORDER — AZITHROMYCIN 250 MG PO TABS
500.0000 mg | ORAL_TABLET | Freq: Once | ORAL | Status: AC
  Administered 2016-06-17: 500 mg via ORAL
  Filled 2016-06-17: qty 2

## 2016-06-17 NOTE — ED Triage Notes (Signed)
[facility]- pt coming from home with c/o cough, shortness of breath, and body aches X4 days. Pt has hx of COPD. No distress noted. Pt reports sore throat as well as earache. Chronic O2 use at home.

## 2016-06-17 NOTE — ED Notes (Signed)
Sharp cp  X 3-4 months states  Has seen a dr but they do not know what is going on and ankles swelling he reports x 1 week

## 2016-06-17 NOTE — ED Notes (Signed)
Back from xray placed back on monitor

## 2016-06-17 NOTE — ED Provider Notes (Signed)
[facility] DEPT Provider Note   CSN: [id number] Arrival date & time: 06/17/16  0813   History   Chief Complaint Chief Complaint  Patient presents with  . Cough  . Shortness of Breath    HPI Tony Sullivan is a 55 y.o. male who presents with multiple complaints. PMH significant for COPD currently on 2L O2, HTN, allergies. He states that he has been not feeling well for the past 2 days. He reports fever of 101 yesterday, chills, generalized body aches, ear pain L>R, sore throat, nasal congestion, SOB, productive cough with green sputum, and upper abdominal pain. Denies chest pain, palpitations, wheezing, nausea, vomiting, diarrhea/constipation, irritative voiding symptoms. He has tried Tylenol OTC. PCN allergy.  His other complaint is ankle swelling L>R for the past week. He states he has some pain with ambulating. Denies injury. He states it has gone down since yesterday but would like it checked.  HPI  Past Medical History:  Diagnosis Date  . Asthma   . COPD (chronic obstructive pulmonary disease) ([place]) 2004   diagnosed in 2004, no PFT's to date.  Started on home O2 12/2013, after found to be desatting at PCP's office, and referred to pulmonology.  . High blood pressure   . Seasonal allergies     Patient Active Problem List   Diagnosis Date Noted  . Atypical chest pain 12/05/2015  . Essential hypertension 06/15/2014  . Pectoralis muscle strain 01/11/2014  . COPD with acute exacerbation ([place]) 01/10/2014  . COPD  GOLD II 01/08/2014  . Smoker 01/08/2014  . Cough 01/08/2014  . Chronic respiratory failure with hypoxia ([place]) 01/08/2014    Past Surgical History:  Procedure Laterality Date  . APPENDECTOMY  1980  . HIP SURGERY Right   . SHOULDER ARTHROSCOPY Right      Home Medications    Prior to Admission medications   Medication Sig Start Date End Date Taking? Authorizing Provider  albuterol (PROAIR HFA) 108 (90 BASE) MCG/ACT inhaler 2 puffs up to every 4 hours if  needed for short of breath Patient taking differently: Inhale 2 puffs into the lungs every 4 (four) hours as needed for wheezing or shortness of breath.  07/31/15  Yes [name], MD  albuterol (PROVENTIL) (2.5 MG/3ML) 0.083% nebulizer solution Take 2.5 mg by nebulization every 4 (four) hours as needed for wheezing or shortness of breath.    Yes Historical Provider, MD  amLODipine (NORVASC) 10 MG tablet Take 10 mg by mouth every morning.    Yes Historical Provider, MD  benztropine (COGENTIN) 0.5 MG tablet Take 0.5 mg by mouth at bedtime.    Yes Historical Provider, MD  budesonide-formoterol (SYMBICORT) 160-4.5 MCG/ACT inhaler Inhale 2 puffs into the lungs 2 (two) times daily. 04/05/15  Yes Tammy S Parrett, NP  cetirizine (ZYRTEC) 10 MG tablet Take 10 mg by mouth daily.   Yes Historical Provider, MD  famotidine (PEPCID) 20 MG tablet One after bfast and supper 04/01/16  Yes [name], MD  fluticasone [facility]) 50 MCG/ACT nasal spray Place 2 sprays into both nostrils 3 (three) times daily as needed for allergies.    Yes Historical Provider, MD  loxapine (LOXITANE) 10 MG capsule Take 10 mg by mouth at bedtime.    Yes Historical Provider, MD  megestrol (MEGACE) 40 MG tablet Take 40 mg by mouth daily.   Yes Historical Provider, MD  meloxicam (MOBIC) 7.5 MG tablet Take 7.5 mg by mouth 2 (two) times daily.    Yes Historical Provider, MD  mirtazapine (REMERON) 30 MG tablet Take 30 mg by mouth at bedtime.   Yes Historical Provider, MD  Multiple Vitamin (MULTIVITAMIN) capsule Take 1 capsule by mouth daily.   Yes Historical Provider, MD  OXYGEN Inhale 2-3 L into the lungs as directed. 2-3 liters with sleep and exertion as needed for low oxygen   Yes Historical Provider, MD  Respiratory Therapy Supplies (FLUTTER) DEVI Use as directed 04/01/16  Yes [name], MD  risperiDONE (RISPERDAL) 2 MG tablet Take 2 mg by mouth at bedtime.   Yes Historical Provider, MD  TUDORZA PRESSAIR 400 MCG/ACT AEPB INHALE 1 PUFF  INTO THE LUNGS TWO   (TWO) TIMES DAILY. 05/20/16  Yes [name], MD  valsartan (DIOVAN) 160 MG tablet Take 1 tablet (160 mg total) by mouth daily. 08/30/14  Yes [name], MD    Family History Family History  Problem Relation Age of Onset  . Emphysema Mother   . Allergies Mother     "everyone in family"  . Asthma Mother     "everyone in family"  . Heart disease Mother   . Clotting disorder Mother   . Cancer Mother   . Emphysema Father   . Allergies Father   . Allergies Son   . Heart disease Maternal Aunt     Social History Social History  Substance Use Topics  . Smoking status: Former Smoker    Packs/day: 1.00    Years: 36.00    Types: Cigarettes    Quit date: 11/04/2015  . Smokeless tobacco: Never Used  . Alcohol use 8.4 - 12.6 oz/week    14 - 21 Standard drinks or equivalent per week     Comment: 2-3 beers/day, with occasional "binges".  No history of withdrawal     Allergies   Penicillins; Sulfa antibiotics; Famotidine; and Levocetirizine   Review of Systems Review of Systems  Constitutional: Positive for chills, fatigue and fever.  HENT: Positive for congestion, ear pain and sore throat.   Respiratory: Positive for cough and shortness of breath. Negative for wheezing.   Cardiovascular: Positive for chest pain. Negative for palpitations and leg swelling.  Gastrointestinal: Positive for abdominal pain. Negative for constipation, diarrhea, nausea and vomiting.  Genitourinary: Negative for dysuria.  Musculoskeletal: Positive for arthralgias.    Physical Exam Updated Vital Signs BP 130/91 (BP Location: Right Arm)   Pulse 74   Resp 25   Ht '5\' 9"'$  (1.753 m)   Wt 70.3 kg   SpO2 99%   BMI 22.89 kg/m   Physical Exam  Constitutional: He is oriented to person, place, and time. He appears well-developed and well-nourished. No distress.  On 2L O2 via [place]  HENT:  Head: Normocephalic and atraumatic.  Right Ear: Hearing, tympanic membrane, external ear and  ear canal normal.  Left Ear: Hearing, tympanic membrane, external ear and ear canal normal.  Nose: Nose normal. Right sinus exhibits no maxillary sinus tenderness and no frontal sinus tenderness. Left sinus exhibits no maxillary sinus tenderness and no frontal sinus tenderness.  Mouth/Throat: Uvula is midline and mucous membranes are normal. Posterior oropharyngeal erythema present. No oropharyngeal exudate or tonsillar abscesses.  Eyes: Conjunctivae are normal. Pupils are equal, round, and reactive to light. Right eye exhibits no discharge. Left eye exhibits no discharge. No scleral icterus.  Neck: Normal range of motion. Neck supple.  Cardiovascular: Normal rate and regular rhythm.  Exam reveals no gallop and no friction rub.   No murmur heard. Pulmonary/Chest: Effort normal and  breath sounds normal. No respiratory distress. He has no wheezes. He has no rales. He exhibits no tenderness.  Abdominal: Soft. Bowel sounds are normal. He exhibits no distension and no mass. There is tenderness. There is no rebound and no guarding. No hernia.  Epigastric tenderness; medium sized transverse scar on right side of abdomen from appendectomy  Musculoskeletal: He exhibits no edema.  Left and right ankle: Very minimal swelling of L ankle when compared to R. No deformity. No tenderness to palpation of ankle or calf. FROM. N/V intact.   Neurological: He is alert and oriented to person, place, and time.  Skin: Skin is warm and dry.  Psychiatric: He has a normal mood and affect. His behavior is normal.  Nursing note and vitals reviewed.    ED Treatments / Results  Labs (all labs ordered are listed, but only abnormal results are displayed) Labs Reviewed  RAPID STREP SCREEN (NOT AT [name]) - Abnormal; Notable for the following:       Result Value   Streptococcus, Group A Screen (Direct) POSITIVE (*)    All other components within normal limits  BASIC METABOLIC PANEL - Abnormal; Notable for the following:     Calcium 8.5 (*)    All other components within normal limits  CBC WITH DIFFERENTIAL/PLATELET - Abnormal; Notable for the following:    WBC 11.7 (*)    RBC 3.95 (*)    Hemoglobin 12.5 (*)    Neutro Abs 7.9 (*)    Monocytes Absolute 1.1 (*)    All other components within normal limits  BRAIN NATRIURETIC PEPTIDE  I-STAT TROPOININ, ED    EKG  EKG Interpretation None       Radiology Dg Chest 2 View  Result Date: 06/17/2016 CLINICAL DATA:  Shortness of breath, cough and chest pain. EXAM: CHEST  2 VIEW COMPARISON:  04/26/2016 FINDINGS: Stable chronic lung disease with bilateral parenchymal scarring and emphysematous changes present. There is no evidence of pulmonary edema, consolidation, pneumothorax, nodule or pleural fluid. The heart size and mediastinal contours are normal. The bony thorax is unremarkable. IMPRESSION: Stable chronic lung disease.  No acute findings. Electronically Signed   By: Aletta Edouard M.D.   On: 06/17/2016 09:27    Procedures Procedures (including critical care time)  Medications Ordered in ED Medications  ipratropium-albuterol (DUONEB) 0.5-2.5 (3) MG/3ML nebulizer solution 3 mL (3 mLs Nebulization Given 06/17/16 1107)  azithromycin (ZITHROMAX) tablet 500 mg (500 mg Oral Given 06/17/16 1108)     Initial Impression / Assessment and Plan / ED Course  I have reviewed the triage vital signs and the nursing notes.  Pertinent labs & imaging results that were available during my care of the patient were reviewed by me and considered in my medical decision making (see chart for details).  Clinical Course   55 year old male presents with strep phayngitis. Patient is afebrile, not tachycardic, and not hypoxic. Slightly hypertensive and tachypneic but he does have COPD and is chronically SOB. Duoneb given for comfort. Rapid strep obtained was positive. 1 dose of Azithromycin given here. Recommended Tylenol for pain/fever. CBC remarkable for mild leukocytosis. CMP  remarkable for mild hypocalcemia. Troponin is 0. CXR is negative for pneumonia. BNP is 31.4. Unclear etiology of ankle swelling. Low suspicion for DVT and his ankle swelling has improved on its own per patient. Patient is NAD, non-toxic, with stable VS. Patient is informed of clinical course, understands medical decision making process, and agrees with plan. Opportunity for questions provided and  all questions answered. Return precautions given.   Final Clinical Impressions(s) / ED Diagnoses   Final diagnoses:  Strep pharyngitis    New Prescriptions New Prescriptions   AZITHROMYCIN (ZITHROMAX) 500 MG TABLET    Take 1 tablet (500 mg total) by mouth daily. Take 1 daily      Recardo Evangelist, PA-C 06/17/16 [place], MD 06/17/16 2117

## 2016-06-17 NOTE — Discharge Instructions (Signed)
You have gotten one dose of antibiotics today. You can take you second dose tomorrow. Follow up with your PCP as needed.

## 2016-07-11 ENCOUNTER — Telehealth: Payer: Self-pay | Admitting: Internal Medicine

## 2016-07-11 DIAGNOSIS — J449 Chronic obstructive pulmonary disease, unspecified: Secondary | ICD-10-CM

## 2016-07-11 NOTE — Telephone Encounter (Signed)
Spoke with pt. States that he is currently using [facility] for his nebulizer needs. [facility] is not responding to him fast enough so he would like to switch DME to [facility]. He wants to leave his oxygen with [facility] at this time. Advised him that his insurance might not let him use 2 DMEs at the same time but he didn't seem to have a problem with this. Order has been placed. Nothing further was needed.

## 2016-07-11 NOTE — Telephone Encounter (Signed)
Pt now calling back about his oxygen also [phone] fax

## 2016-07-12 ENCOUNTER — Telehealth: Payer: Self-pay | Admitting: Internal Medicine

## 2016-07-12 NOTE — Telephone Encounter (Signed)
Spoke with pt, states he has since heard from [facility] regarding his order and that nothing further is needed.  Will close encounter.

## 2016-07-17 ENCOUNTER — Encounter (HOSPITAL_COMMUNITY): Payer: Self-pay | Admitting: Emergency Medicine

## 2016-07-17 ENCOUNTER — Telehealth (HOSPITAL_BASED_OUTPATIENT_CLINIC_OR_DEPARTMENT_OTHER): Payer: Self-pay | Admitting: Emergency Medicine

## 2016-07-17 ENCOUNTER — Emergency Department (HOSPITAL_COMMUNITY): Payer: Medicaid Other

## 2016-07-17 ENCOUNTER — Emergency Department (HOSPITAL_COMMUNITY)
Admission: EM | Admit: 2016-07-17 | Discharge: 2016-07-17 | Disposition: A | Discharge status: Home / Self Care | Payer: Medicaid Other | Attending: Emergency Medicine | Admitting: Emergency Medicine

## 2016-07-17 DIAGNOSIS — Z7951 Long term (current) use of inhaled steroids: Secondary | ICD-10-CM | POA: Insufficient documentation

## 2016-07-17 DIAGNOSIS — J441 Chronic obstructive pulmonary disease with (acute) exacerbation: Secondary | ICD-10-CM | POA: Diagnosis not present

## 2016-07-17 DIAGNOSIS — Z87891 Personal history of nicotine dependence: Secondary | ICD-10-CM | POA: Insufficient documentation

## 2016-07-17 DIAGNOSIS — Z79899 Other long term (current) drug therapy: Secondary | ICD-10-CM | POA: Insufficient documentation

## 2016-07-17 DIAGNOSIS — I10 Essential (primary) hypertension: Secondary | ICD-10-CM | POA: Insufficient documentation

## 2016-07-17 DIAGNOSIS — J45909 Unspecified asthma, uncomplicated: Secondary | ICD-10-CM | POA: Insufficient documentation

## 2016-07-17 DIAGNOSIS — R0602 Shortness of breath: Secondary | ICD-10-CM | POA: Diagnosis present

## 2016-07-17 LAB — CBC WITH DIFFERENTIAL/PLATELET
Basophils Absolute: 0 10*3/uL (ref 0.0–0.1)
Basophils Relative: 0 %
Eosinophils Absolute: 0.2 10*3/uL (ref 0.0–0.7)
Eosinophils Relative: 2 %
HCT: 45.8 % (ref 39.0–52.0)
Hemoglobin: 15.3 g/dL (ref 13.0–17.0)
Lymphocytes Relative: 34 %
Lymphs Abs: 3.3 10*3/uL (ref 0.7–4.0)
MCH: 31.9 pg (ref 26.0–34.0)
MCHC: 33.4 g/dL (ref 30.0–36.0)
MCV: 95.4 fL (ref 78.0–100.0)
Monocytes Absolute: 0.9 10*3/uL (ref 0.1–1.0)
Monocytes Relative: 9 %
Neutro Abs: 5.5 10*3/uL (ref 1.7–7.7)
Neutrophils Relative %: 55 %
Platelets: 280 10*3/uL (ref 150–400)
RBC: 4.8 MIL/uL (ref 4.22–5.81)
RDW: 13.6 % (ref 11.5–15.5)
WBC: 9.8 10*3/uL (ref 4.0–10.5)

## 2016-07-17 LAB — I-STAT TROPONIN, ED: Troponin i, poc: 0 ng/mL (ref 0.00–0.08)

## 2016-07-17 IMAGING — CR DG CHEST 2V
3 series · 3 of 3 positions shown · non-contrast
Comparison: [DATE]

CLINICAL DATA: Shortness of breath, cough, congestion, wheezing,
and chest tightness. History of COPD. Previous smoker.

EXAM:
CHEST  2 VIEW

[w chest pa]
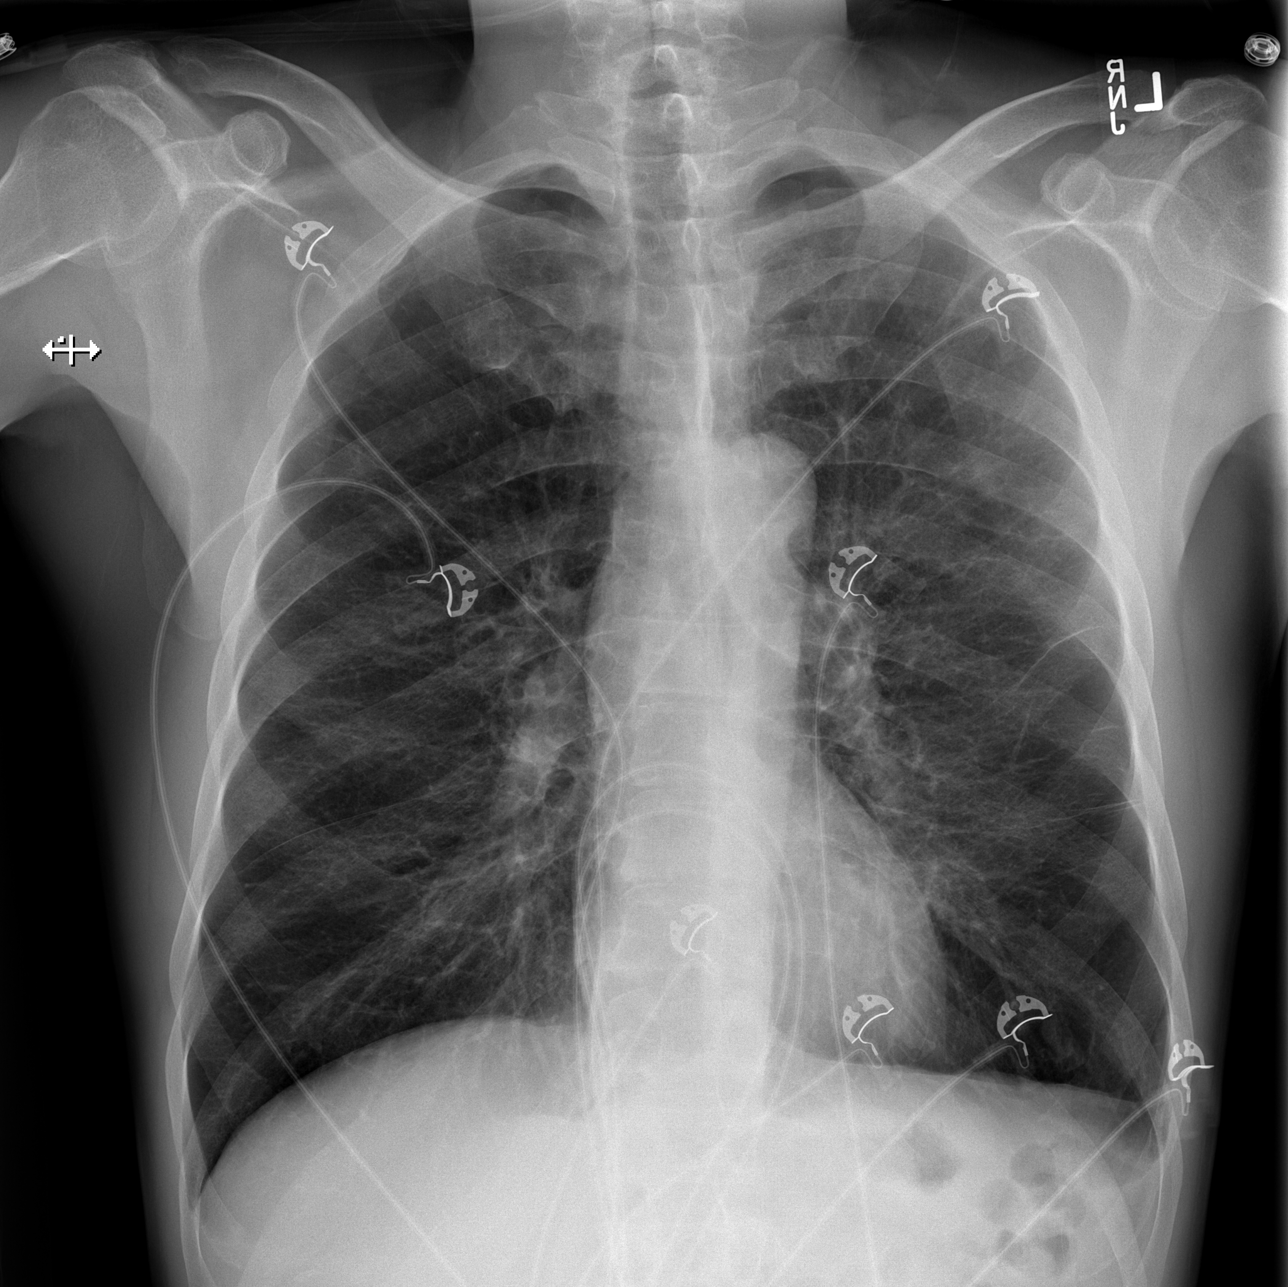

[w chest lat (1 of 2)]
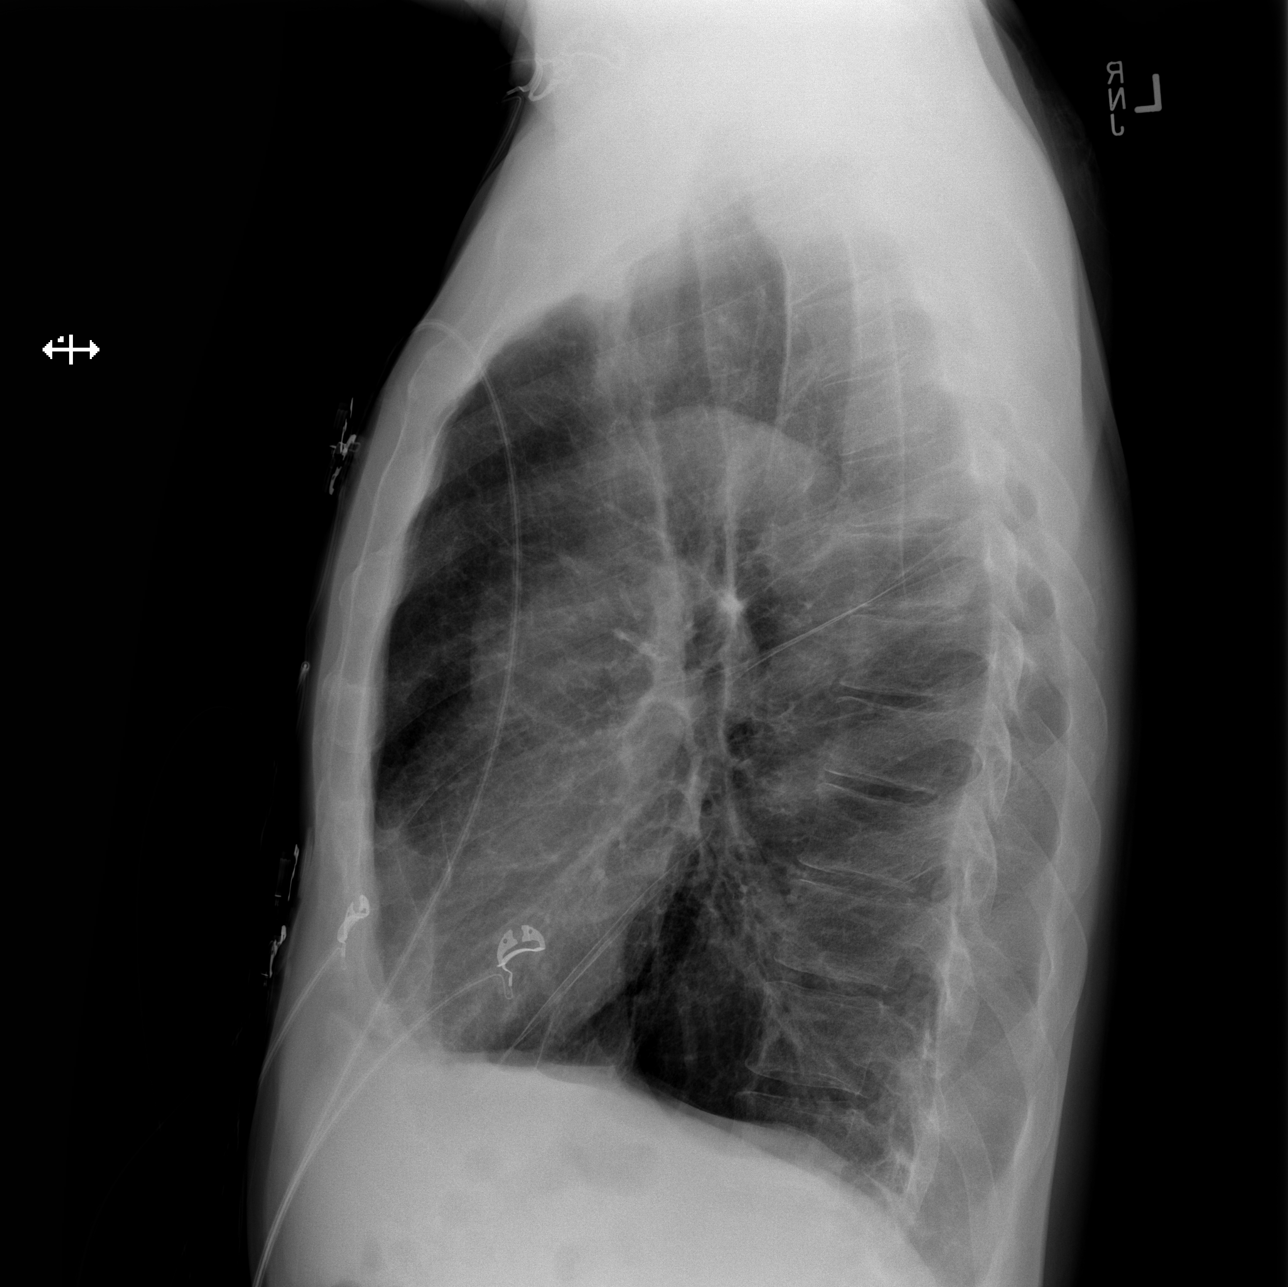

[w chest lat (2 of 2)]
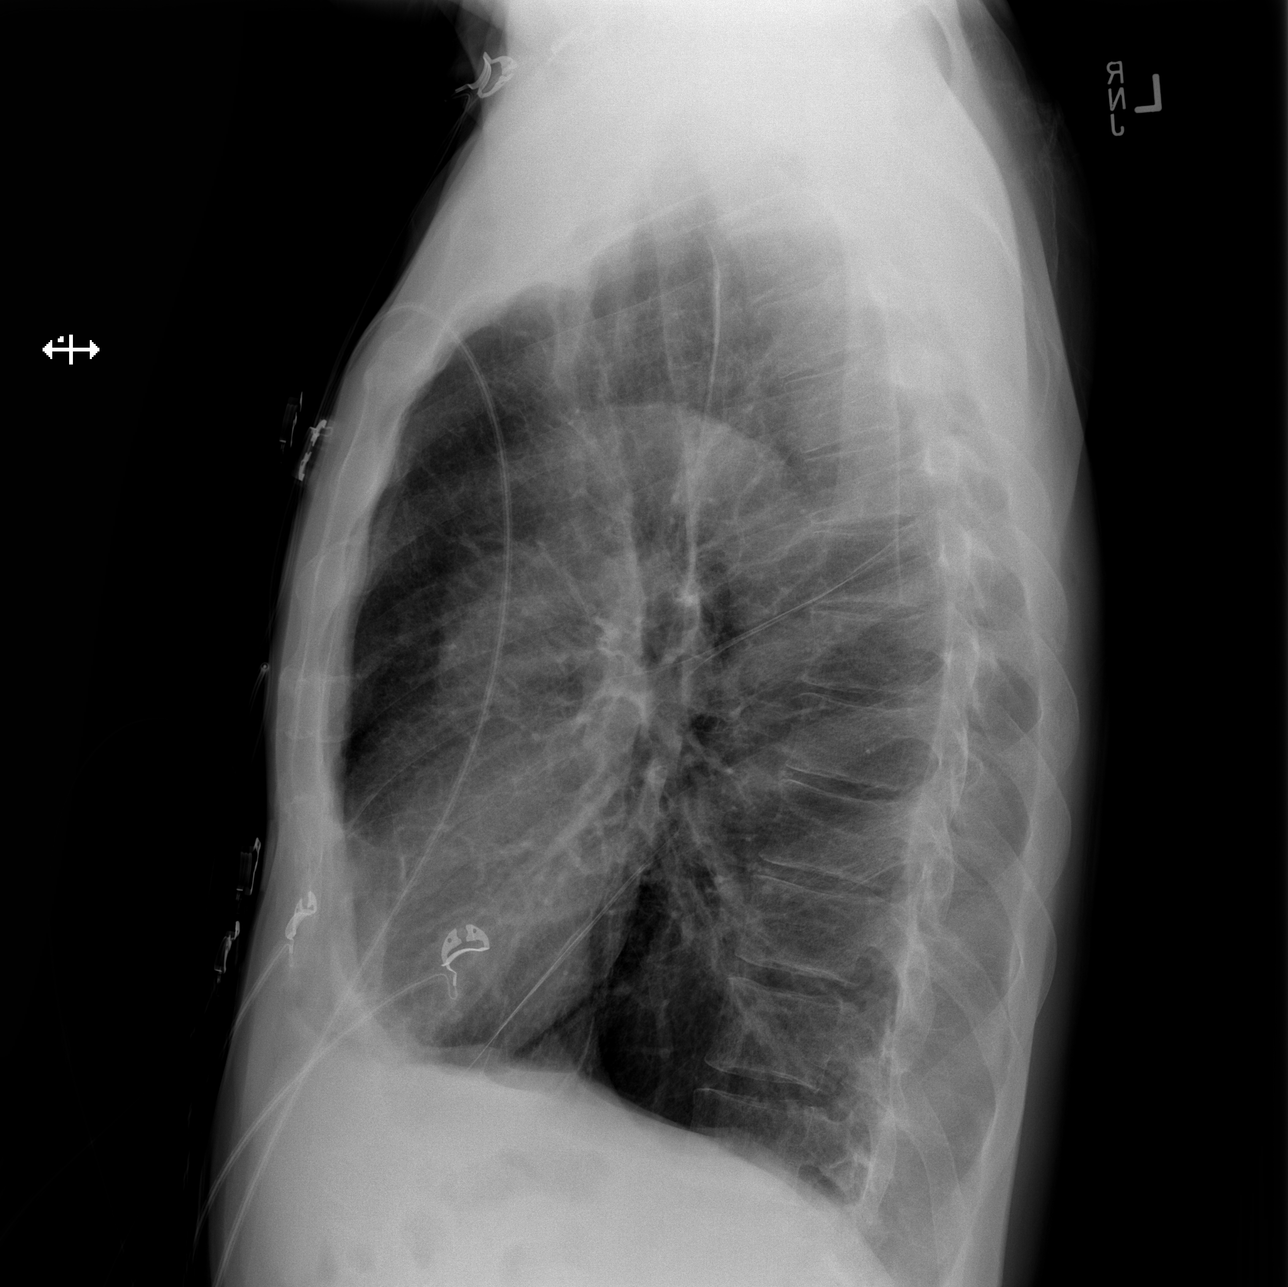

[3 of 3 positions shown; findings below may reference images not displayed]

FINDINGS: Prominent emphysematous changes in the lungs with scattered fibrosis
and chronic bronchitic changes. No focal airspace disease or
consolidation. No blunting of costophrenic angles. No pneumothorax.
Normal heart size and pulmonary vascularity. Mediastinal contours
appear intact. Similar appearance to previous study.
IMPRESSION: Prominent emphysematous changes and chronic bronchitic changes in
the lungs. No evidence of active pulmonary disease

## 2016-07-17 MED ORDER — PREDNISONE 20 MG PO TABS: ORAL_TABLET | ORAL | 0 refills | Status: DC

## 2016-07-17 MED ORDER — ALBUTEROL SULFATE (2.5 MG/3ML) 0.083% IN NEBU
5.0000 mg | INHALATION_SOLUTION | Freq: Once | RESPIRATORY_TRACT | Status: AC
Start: 2016-07-17 — End: 2016-07-17
  Administered 2016-07-17: 5 mg via RESPIRATORY_TRACT
  Filled 2016-07-17: qty 6

## 2016-07-17 MED ORDER — IPRATROPIUM BROMIDE 0.02 % IN SOLN
0.5000 mg | Freq: Once | RESPIRATORY_TRACT | Status: AC
  Administered 2016-07-17: 0.5 mg via RESPIRATORY_TRACT
  Filled 2016-07-17: qty 2.5

## 2016-07-17 MED ORDER — GUAIFENESIN 100 MG/5ML PO SOLN
5.0000 mL | Freq: Once | ORAL | Status: AC
  Administered 2016-07-17: 100 mg via ORAL
  Filled 2016-07-17: qty 5

## 2016-07-17 MED ORDER — DOXYCYCLINE HYCLATE 100 MG PO CAPS: 100.0000 mg | ORAL_CAPSULE | Freq: Two times a day (BID) | ORAL | 0 refills | Status: DC

## 2016-07-17 MED ORDER — DOXYCYCLINE HYCLATE 100 MG PO TABS
100.0000 mg | ORAL_TABLET | Freq: Once | ORAL | Status: AC
  Administered 2016-07-17: 100 mg via ORAL
  Filled 2016-07-17: qty 1

## 2016-07-17 MED ORDER — GUAIFENESIN ER 600 MG PO TB12: 1200.0000 mg | ORAL_TABLET | Freq: Two times a day (BID) | ORAL | 0 refills | Status: DC | PRN

## 2016-07-17 MED ORDER — ALBUTEROL SULFATE (2.5 MG/3ML) 0.083% IN NEBU
5.0000 mg | INHALATION_SOLUTION | Freq: Once | RESPIRATORY_TRACT | Status: AC
  Administered 2016-07-17: 5 mg via RESPIRATORY_TRACT
  Filled 2016-07-17: qty 6

## 2016-07-17 MED ORDER — PREDNISONE 20 MG PO TABS
60.0000 mg | ORAL_TABLET | Freq: Once | ORAL | Status: AC
  Administered 2016-07-17: 60 mg via ORAL
  Filled 2016-07-17: qty 3

## 2016-07-17 NOTE — ED Notes (Signed)
Bed: MB31 Expected date:  Expected time:  Means of arrival:  Comments: EMS 55 yo male SOB,hx COPD-took breathing treatment with relief

## 2016-07-17 NOTE — ED Provider Notes (Signed)
[facility] DEPT Provider Note   CSN: [id number] Arrival date & time: 07/17/16  7026     History   Chief Complaint Chief Complaint  Patient presents with  . Shortness of Breath    HPI Tony Sullivan is a 55 y.o. male.  The history is provided by the patient.  Shortness of Breath  This is a recurrent problem. The problem occurs continuously.The current episode started more than 2 days ago. The problem has not changed since onset.Associated symptoms include wheezing. Pertinent negatives include no fever, no rhinorrhea, no PND, no chest pain, no syncope, no abdominal pain, no leg pain and no leg swelling. The problem's precipitants include an emotional upset. He has tried beta-agonist inhalers for the symptoms. The treatment provided no relief. He has had prior hospitalizations. He has had prior ED visits. Associated medical issues include COPD.  Wheezing   This is a recurrent problem. The current episode started more than 2 days ago. The problem occurs constantly. The problem has not changed since onset.Pertinent negatives include no chest pain, no fever, no abdominal pain and no rhinorrhea. The problem's precipitants include an emotional upset. He has tried beta-agonist inhalers for the symptoms. The treatment provided no relief. He has had prior hospitalizations. He has had prior ED visits. His past medical history is significant for COPD.  Chronic COPD and has had acute exacerbation for 3 days and felt panicked about same and came in for evaluation  Past Medical History:  Diagnosis Date  . Asthma   . COPD (chronic obstructive pulmonary disease) ([place]) 2004   diagnosed in 2004, no PFT's to date.  Started on home O2 12/2013, after found to be desatting at PCP's office, and referred to pulmonology.  . High blood pressure   . Seasonal allergies     Patient Active Problem List   Diagnosis Date Noted  . Atypical chest pain 12/05/2015  . Essential hypertension 06/15/2014  .  Pectoralis muscle strain 01/11/2014  . COPD with acute exacerbation ([place]) 01/10/2014  . COPD  GOLD II 01/08/2014  . Smoker 01/08/2014  . Cough 01/08/2014  . Chronic respiratory failure with hypoxia ([place]) 01/08/2014    Past Surgical History:  Procedure Laterality Date  . APPENDECTOMY  1980  . HIP SURGERY Right   . SHOULDER ARTHROSCOPY Right        Home Medications    Prior to Admission medications   Medication Sig Start Date End Date Taking? Authorizing Provider  albuterol (PROAIR HFA) 108 (90 BASE) MCG/ACT inhaler 2 puffs up to every 4 hours if needed for short of breath Patient taking differently: Inhale 2 puffs into the lungs every 4 (four) hours as needed for wheezing or shortness of breath.  07/31/15   [name], MD  albuterol (PROVENTIL) (2.5 MG/3ML) 0.083% nebulizer solution Take 2.5 mg by nebulization every 4 (four) hours as needed for wheezing or shortness of breath.     Historical Provider, MD  amLODipine (NORVASC) 10 MG tablet Take 10 mg by mouth every morning.     Historical Provider, MD  azithromycin (ZITHROMAX) 500 MG tablet Take 1 tablet (500 mg total) by mouth daily. Take 1 daily 06/17/16   Recardo Evangelist, PA-C  benztropine (COGENTIN) 0.5 MG tablet Take 0.5 mg by mouth at bedtime.     Historical Provider, MD  budesonide-formoterol (SYMBICORT) 160-4.5 MCG/ACT inhaler Inhale 2 puffs into the lungs 2 (two) times daily. 04/05/15   Tammy S Parrett, NP  cetirizine (ZYRTEC) 10 MG tablet Take  10 mg by mouth daily.    Historical Provider, MD  famotidine (PEPCID) 20 MG tablet One after bfast and supper 04/01/16   [name], MD  fluticasone [facility]) 50 MCG/ACT nasal spray Place 2 sprays into both nostrils 3 (three) times daily as needed for allergies.     Historical Provider, MD  loxapine (LOXITANE) 10 MG capsule Take 10 mg by mouth at bedtime.     Historical Provider, MD  megestrol (MEGACE) 40 MG tablet Take 40 mg by mouth daily.    Historical Provider, MD  meloxicam  (MOBIC) 7.5 MG tablet Take 7.5 mg by mouth 2 (two) times daily.     Historical Provider, MD  mirtazapine (REMERON) 30 MG tablet Take 30 mg by mouth at bedtime.    Historical Provider, MD  Multiple Vitamin (MULTIVITAMIN) capsule Take 1 capsule by mouth daily.    Historical Provider, MD  OXYGEN Inhale 2-3 L into the lungs as directed. 2-3 liters with sleep and exertion as needed for low oxygen    Historical Provider, MD  Respiratory Therapy Supplies (FLUTTER) DEVI Use as directed 04/01/16   [name], MD  risperiDONE (RISPERDAL) 2 MG tablet Take 2 mg by mouth at bedtime.    Historical Provider, MD  TUDORZA PRESSAIR 400 MCG/ACT AEPB INHALE 1 PUFF INTO THE LUNGS TWO   (TWO) TIMES DAILY. 05/20/16   [name], MD  valsartan (DIOVAN) 160 MG tablet Take 1 tablet (160 mg total) by mouth daily. 08/30/14   [name], MD    Family History Family History  Problem Relation Age of Onset  . Emphysema Mother   . Allergies Mother     "everyone in family"  . Asthma Mother     "everyone in family"  . Heart disease Mother   . Clotting disorder Mother   . Cancer Mother   . Emphysema Father   . Allergies Father   . Allergies Son   . Heart disease Maternal Aunt     Social History Social History  Substance Use Topics  . Smoking status: Former Smoker    Packs/day: 1.00    Years: 36.00    Types: Cigarettes    Quit date: 03/17/2016  . Smokeless tobacco: Never Used  . Alcohol use 8.4 - 12.6 oz/week    14 - 21 Standard drinks or equivalent per week     Comment: 2-3 beers/day, with occasional "binges".  No history of withdrawal     Allergies   Penicillins; Sulfa antibiotics; Famotidine; and Levocetirizine   Review of Systems Review of Systems  Constitutional: Negative for diaphoresis and fever.  HENT: Negative for rhinorrhea.   Respiratory: Positive for shortness of breath and wheezing. Negative for chest tightness.   Cardiovascular: Negative for chest pain, palpitations, leg swelling,  syncope and PND.  Gastrointestinal: Negative for abdominal pain.  All other systems reviewed and are negative.    Physical Exam Updated Vital Signs BP 127/99 (BP Location: Right Arm)   Pulse 70   Temp 97.6 F (36.4 C) (Oral)   Resp 20   SpO2 97%   Physical Exam  Constitutional: He is oriented to person, place, and time. He appears well-developed and well-nourished. No distress.  HENT:  Head: Normocephalic and atraumatic.  Mouth/Throat: No oropharyngeal exudate.  Eyes: EOM are normal. Pupils are equal, round, and reactive to light.  Neck: Normal range of motion. Neck supple. No JVD present.  Cardiovascular: Normal rate, regular rhythm and intact distal pulses.   Pulmonary/Chest:  Breath sounds normal. No stridor. No respiratory distress.  Musculoskeletal: Normal range of motion. He exhibits no edema or tenderness.  Neurological: He is alert and oriented to person, place, and time. He has normal reflexes.  Skin: Skin is warm. Capillary refill takes less than 2 seconds.  Psychiatric: His mood appears anxious.     ED Treatments / Results  Labs (all labs ordered are listed, but only abnormal results are displayed) Labs Reviewed  CBC WITH DIFFERENTIAL/PLATELET  I-STAT CHEM 8, ED  I-STAT TROPOININ, ED    EKG  EKG Interpretation  Date/Time:  Wednesday July 17 2016 04:39:01 EDT Ventricular Rate:  67 PR Interval:    QRS Duration: 89 QT Interval:  411 QTC Calculation: 434 R Axis:   -63 Text Interpretation:  Sinus rhythm Consider left atrial enlargement Left anterior fascicular block Confirmed by [facility]  MD, Izek Corvino ([id number]) on 07/17/2016 4:55:14 AM       Radiology Dg Chest 2 View  Result Date: 07/17/2016 CLINICAL DATA:  Shortness of breath, cough, congestion, wheezing, and chest tightness. History of COPD. Previous smoker. EXAM: CHEST  2 VIEW COMPARISON:  06/17/2016 FINDINGS: Prominent emphysematous changes in the lungs with scattered fibrosis and chronic  bronchitic changes. No focal airspace disease or consolidation. No blunting of costophrenic angles. No pneumothorax. Normal heart size and pulmonary vascularity. Mediastinal contours appear intact. Similar appearance to previous study. IMPRESSION: Prominent emphysematous changes and chronic bronchitic changes in the lungs. No evidence of active pulmonary disease Electronically Signed   By: Lucienne Capers M.D.   On: 07/17/2016 05:09    Procedures Procedures (including critical care time)  Medications Ordered in ED Medications  albuterol (PROVENTIL) (2.5 MG/3ML) 0.083% nebulizer solution 5 mg (not administered)  ipratropium (ATROVENT) nebulizer solution 0.5 mg (not administered)  predniSONE (DELTASONE) tablet 60 mg (not administered)  doxycycline (VIBRA-TABS) tablet 100 mg (not administered)  guaiFENesin (ROBITUSSIN) 100 MG/5ML solution 100 mg (not administered)  albuterol (PROVENTIL) (2.5 MG/3ML) 0.083% nebulizer solution 5 mg (5 mg Nebulization Given 07/17/16 0442)     Initial Impression / Assessment and Plan / ED Course  I have reviewed the triage vital signs and the nursing notes.  Pertinent labs & imaging results that were available during my care of the patient were reviewed by me and considered in my medical decision making (see chart for details).  Clinical Course    Clear and improved post nebs.    Final Clinical Impressions(s) / ED Diagnoses   Final diagnoses:  None   All questions answered to patient's satisfaction. Based on history and exam patient has been appropriately medically screened and emergency conditions excluded. Patient is stable for discharge at this time. Follow up with your PMD for recheck in 2 days and strict return precautions given  New Prescriptions New Prescriptions   No medications on file     Lala Been, MD 07/17/16 2305

## 2016-07-17 NOTE — ED Triage Notes (Addendum)
Pt BIB EMS from home for c/o SOB; pt states he has woken up the past few mornings with SOB; reports coughing up colorless sputum; hx of COPD, on 3L O2 at home; pt took breathing treatment at home; 96% on 3L; pt states he feels better since EMS has arrived

## 2016-07-29 ENCOUNTER — Ambulatory Visit (INDEPENDENT_AMBULATORY_CARE_PROVIDER_SITE_OTHER): Payer: Medicaid Other | Admitting: Internal Medicine

## 2016-07-29 ENCOUNTER — Telehealth: Payer: Self-pay | Admitting: *Deleted

## 2016-07-29 ENCOUNTER — Encounter: Payer: Self-pay | Admitting: Internal Medicine

## 2016-07-29 VITALS — BP 124/80 | HR 89 | Ht 64.0 in | Wt 152.2 lb

## 2016-07-29 DIAGNOSIS — J9611 Chronic respiratory failure with hypoxia: Secondary | ICD-10-CM | POA: Diagnosis not present

## 2016-07-29 DIAGNOSIS — J449 Chronic obstructive pulmonary disease, unspecified: Secondary | ICD-10-CM | POA: Diagnosis not present

## 2016-07-29 DIAGNOSIS — Z23 Encounter for immunization: Secondary | ICD-10-CM | POA: Diagnosis not present

## 2016-07-29 MED ORDER — PANTOPRAZOLE SODIUM 40 MG PO TBEC: 40.0000 mg | DELAYED_RELEASE_TABLET | Freq: Every day | ORAL | 11 refills | Status: DC

## 2016-07-29 MED ORDER — PREDNISONE 10 MG PO TABS: ORAL_TABLET | ORAL | 11 refills | Status: DC

## 2016-07-29 NOTE — Progress Notes (Signed)
Subjective:    Patient ID: Tony Sullivan, male    DOB: 04/07/1961  MRN: [id number]    Brief patient profile:  55  yobm quit smoking 03/17/16 with breathing difficulties since 2004 dx as copd on advair 250 /50 but getting worse since 2014 and placed on 02 for the first time in March 2015 and referred 01/07/2014 to pulmonary clinic by Dr Kennon Holter with GOLD II copd criteria established 02/14/14    History of Present Illness  01/07/2014 1st [facility] office visit/ Wert  Chief Complaint  Patient presents with  . Advice Only    Referred for COPD, SOB.  Pt c/o SOB, low 02, prod cough with white mucous.  Pt forgot med list, unsure of what all he is taking  still can do a [employer] but uses [facility] parking, assoc with congested coughing esp in am worse in winter. rec Stop advair   Plan A =  Automatic =  symbicort 160 Take 2 puffs first thing in am and then another 2 puffs about 12 hours later. spiriva in am only  Plan B = Backup- Proventil use it only if you can't your breath - ok to use up to every 4 hours if needed Plan C = Nebulizer up to every 4 hours if needed only if you try plan B first and it doesn't work   02/14/2014 f/u ov/Wert re: copd GOLD II on ACEi and Advair and still smoking  Chief Complaint  Patient presents with  . Follow-up    Review pft.  Pt c/o prod cough with clear mucuos, SOB with exertion.  Interested in getting a portable 02 concentrator.    rec Plan A =  Automatic =  symbicort 160 Take 2 puffs first thing in am and then another 2 puffs about 12 hours later.                                       spiriva in am only  Plan B = Backup- Proventil use it only if you can't your breath - ok to use up to every 4 hours if needed Plan C = Nebulizer up to every 4 hours if needed only if you try plan B first and it doesn't work The key is to stop smoking completely before smoking completely stops you! 02 should be 3lpm with more than 100 ft walking  Please schedule a follow  up visit in 3 months but call sooner if needed    06/15/2014 f/u ov/Wert re: still smoking/ GOLD II copd 02 dep with ambulation / on spiriva/symbicort Chief Complaint  Patient presents with  . Follow-up    Pt reports that cough and SOB has been more increased recently d/t weather change. Using 2.5Liter O2  No need for saba yet in any form / dry hacking cough  rec Stop lisinopril Start valsartan 160 mg one daily  Please see patient coordinator before you leave today  to schedule ambulatory portable system that will deliver  02 at 2.5 lpm to replace your tank     09/15/2014 Follow up and Med Calendar GOLD II COPD s/p exac to ER / still smoking on symbicort and spiriva  Patient returns for a follow-up and medication review. We reviewed all his medications organize them into a medication count with patient education It appears that he is taking his medications correctly but is out , has follow  up with PCP for refills.  Has cut back on smoking .  Doing well w/ no flare of cough or wheezing/dyspnea.  Last visit spiriva was stopped, no flare off inhaler.  Needs refill on omeprazole , Zyrtec and proventil  rec Follow med calendar closely and bring to each visit. Continue current regimen. Continue to work on not smoking.    02/20/2015 f/u ov/Wert re: GOLD II / no med calendar/ no meds / still smoking  Chief Complaint  Patient presents with  . Follow-up    Pt c/o of SOB with excertion, wheezing, dry cough. Pt stopped spirvia, and uses nebs and albuterol prn.  no real change doe/ MMRC Grad 1/2 minimal variability  better on 3lpm when he uses it Easily confused with details of care and what he is vs not taking vs has run out - using multiple doses of saba in hfa and neb form more day than noct / saba helps some  >change o2 As needed  With act   03/22/2015 Follow up : COPD /GOLD II  Pt returns for 1 month follow up .  Says he still gets winded easily.  Has cough on/off. No hemoptysis ,  chest pain or orthopnea.  Remains on Symbicort Twice daily  .  Remains on  O2 with activity. Does still wear at bedtime .  Uses rescue inhaler most days.  Still smoking . Discussed cessation.  rec Continue on Symbicort 2 puffs Twice daily  , rinse after use.  Begin Tudorza 1 puff Twice daily  , rinse after use.  Work on not smoking   07/31/2015  f/u ov/Wert re: GOLD II/ still smoking / over use of saba daily  Chief Complaint  Patient presents with  . Follow-up    Pt reports breathing is fair. C/o prod cough (green phlem x last week), wheezing, chest tx in early AM.    gets panicky when walking outdoors  and uses lots of nebs since doesn't have an hfa saba  Doe = MMRC2 = can't walk a nl pace on a flat grade s sob/ no worse with cough worse this past week  rec Prevnar 13 and flu shots today Plan A =  Automatic =  symbicort 160 Take 2 puffs first thing in am and then another 2 puffs about 12 hours later and tudorza one twice daily as well                Plan B = Backup- Proair use it only if you can't your breath - ok to use up to 2 pffs  every 4 hours if needed Plan C = Nebulizer up to every 4 hours if needed only if you try plan B first and it doesn't work The key is to stop smoking completely before smoking completely stops you! No need for 02 at bedtime or with nl activities, use 3lpm with heavy exertion as needed  Please see patient coordinator before you leave today  to schedule pulmonary rehab > declined     12/05/2015  f/u ov/Wert re: GOLD II / wears 02 2lpm at hs and when walks to store  Chief Complaint  Patient presents with  . Acute Visit    Pt c/o CP for the past 2 months- comes and goes. He states he has burning in the center of his chest and sharp pain on the left. He also c/o more SOB than usual and has been using proair 4-6 x per day on average.   no  proair today / MMRC2 = can't walk a nl pace on a flat grade s sob does ok flat on 2lpm to store  rec Stop omeprazole and  take pepcid 20 mg after breakfast and supper  Avoid  foods that cause gas (especially beans boiled eggs/and raw vegetables like spinach and salads)  and citrucel 1 heaping tsp twice daily with a large glass of water.  Pain should improve w/in 2 weeks  Congratulations on not smoking, it's the most important aspect of your care  No need for 02 except for walks over 200 ft > use 2lpm while walking but pace yourself Only use your albuterol as a rescue medication      04/01/2016  Extended f/u ov/Wert re: copd gold II/ 02 prn / cp resolved/ multiple er trips for copd Chief Complaint  Patient presents with  . Follow-up    Breathing is unchanged. He c/o congestion today and has minimal cough with clear sputum. He is using proair inhaler and neb both approx 4 x per day on average.   way over using  saba esp in am with lots of congestion/rattling up to an hour and doesn't get around to using symb for hours / not timing ppi ac as rec  rec Ok to stop omeprazole when it runs out and just take pepcid at or after bfast and supper For cough/ congestion > flutter valve as much as possible Plan A = Automatic =  symbicort 160 Take 2 puffs first thing in am and then another 2 puffs about 12 hours later and chase symbicort with the tudorza  Plan B = Backup Only use your albuterol as a rescue medication  Plan C = Crisis - only use your albuterol nebulizer if you first try Plan B and it fails to help > ok to use the nebulizer up to every 4 hours but if start needing it regularly call for immediate appointment Plan D = Doctor - call me if B and C not adequate Plan E = ER - go to ER or call 911 if all else fails      07/29/2016  f/u ov/Wert re:  02 2lpm  Chief Complaint  Patient presents with  . Follow-up    Breathing has been worse for the past few days.  He states that he has mucus stuck in his throat all of the time. He is using albuterol inhaler 2 x daily and neb with albuterol 3 x daily on average.    from 2 am - 7 am x since May/June and wakes up can't get breath even with nebulizer Not wearing 02 appropriately - leaves it off, gets sob and overuses saba then gets shaky and nervous and "can't breathe"  No obvious day to day or daytime variability or assoc excess/ purulent sputum or mucus plugs or hemoptysis or cp or chest tightness, subjective wheeze or overt sinus or hb symptoms. No unusual exp hx or h/o childhood pna/ asthma or knowledge of premature birth.  Sleeping ok without nocturnal  or early am exacerbation  of respiratory  c/o's or need for noct saba. Also denies any obvious fluctuation of symptoms with weather or environmental changes or other aggravating or alleviating factors except as outlined above   Current Medications, Allergies, Complete Past Medical History, Past Surgical History, Family History, and Social History were reviewed in [employer] record.  ROS  The following are not active complaints unless bolded sore throat, dysphagia, dental problems, itching, sneezing,  nasal  congestion or excess/ purulent secretions, ear ache,   fever, chills, sweats, unintended wt loss, classically pleuritic or exertional cp,  orthopnea pnd or leg swelling, presyncope, palpitations, abdominal pain, anorexia, nausea, vomiting, diarrhea  or change in bowel or bladder habits, change in stools or urine, dysuria,hematuria,  rash, arthralgias, visual complaints, headache, numbness, weakness or ataxia or problems with walking or coordination,  change in mood/affect or memory.                            Objective:   Physical Exam   06/15/2014  164  >  09/15/2014 158 >   02/20/2015 >156 03/22/2015 >  07/31/2015   150 > 12/05/2015  161   > 04/01/2016    151 > 07/29/2016  152   amb anxious mild hoarse bm nad  / moderately congested rattling cough  Vital signs reviewed/ note sats 96% on Arrival on 2lpm   HEENT mild turbinate edema.  Oropharynx no thrush or excess pnd or  cobblestoning.  No JVD or cervical adenopathy. Mild accessory muscle hypertrophy. Trachea midline, nl thryroid. Chest was hyperinflated by percussion with diminished breath sounds and moderate increased exp time without wheeze. Hoover sign positive at mid inspiration. Regular rate and rhythm without murmur gallop or rub or increase P2 or edema.  Abd: no hsm, nl excursion. Ext warm without cyanosis- mod bilateral clubbing     I personally reviewed images and agree with radiology impression as follows:  CXR:  02/29/16 COPD without acute abnormality.            Assessment & Plan:

## 2016-07-29 NOTE — Patient Instructions (Addendum)
Pantoprazole (protonix) 40 mg   Take  30-60 min before first meal of the day and Pepcid (famotidine)  20 mg one @  bedtime until return to office.    For cough/ congestion > flutter valve as much as possible and guaifenesin up t 1200 mg every 12 hours as needed  When walking wear 02 2lpm (or adjust the amount to keep your oxygen saturation levels over 90% if you buy the meter)  Plan A = Automatic =  symbicort 160 Take 2 puffs first thing in am and then another 2 puffs about 12 hours later and chase symbicort with the tudorza     Plan B = Backup Only use your albuterol as a rescue medication to be used if you can't catch your breath by resting or doing a relaxed purse lip breathing pattern.  - The less you use it, the better it will work when you need it. - Ok to use the inhaler up to 2 puffs  every 4 hours if you must but call for appointment if use goes up over your usual need - Don't leave home without it !!  (think of it like the spare tire for your car)   Plan C = Crisis - only use your albuterol nebulizer if you first try Plan B and it fails to help > ok to use the nebulizer up to every 4 hours but if start needing it regularly call for immediate appointment   Plan D = Deltasone(prednisone)  Prednisone 10 mg take  4 each am x 2 days,   2 each am x 2 days,  1 each am x 2 days and stop    Plan E = ER - go to ER or call 911 if all else fails      See Tammy NP 2  Weeks (or first available)  with all your medications, even over the counter meds, separated in two separate bags, the ones you take no matter what vs the ones you stop once you feel better and take only as needed when you feel you need them.   Tammy  will generate for you a new user friendly medication calendar that will put [place] all on the same page re: your medication use.     Without this process, it simply isn't possible to assure that we are providing  your outpatient care  with  the attention to detail we feel you  deserve.   If we cannot assure that you're getting that kind of care,  then we cannot manage your problem effectively from this clinic.  Once you have seen Tammy and we are sure that we're all on the same page with your medication use she will arrange follow up with me.

## 2016-07-29 NOTE — Assessment & Plan Note (Signed)
-   started on 27 December 2013 by [facility] clinic - 02/14/2014  Walked RA x 3/4  laps @ 185 ft each stopped due to  desat 85% and corrected on 3lpm to x 2 laps  - 06/15/2014  Walked 2.5lpm  2 laps  @ 185 ft each stopped due to  Sob adequate sats  - 02/20/2015  Walked RA x 3 laps @ 185 ft each stopped due to  Sob/desat p 2 laps, nl pace, eliminated on his 02 at 3lpm  - 12/05/2015   Walked RA  2 laps @ 185 ft each stopped due to  desat to 88% and corrected on 2lpm   rec as of 07/29/2016 =  2lpm walking more than 200 ft and 2lpm hs   Not clear he's following either rec for walking or sleeping, need to confirm this when returns for med reconciliation

## 2016-07-29 NOTE — Telephone Encounter (Signed)
-----   Message from [name], MD sent at 07/29/2016  1:41 PM EDT ----- Remind him he should also be using 02 2lpm hs

## 2016-07-29 NOTE — Assessment & Plan Note (Signed)
-   02/14/2014   try symbicort 160 2bid  - PFT's  02/14/2014   FEV1 1.82 (53%) and ratio 47 no better p alb (p spiriva and symbicort 160)  and DLCO 32 with 37%  07/2014 >Spiriva stopped  09/15/2014 Med calendar> not using 02/20/2015   - 02/20/2015 p extensive coaching HFA effectiveness =    90%  - Quit smoking 02/2016  - flutter valve 04/01/2016   DDX of  difficult airways management almost all start with A and  include Adherence, Ace Inhibitors, Acid Reflux, Active Sinus Disease, Alpha 1 Antitripsin deficiency, Anxiety masquerading as Airways dz,  ABPA,  Allergy(esp in young), Aspiration (esp in elderly), Adverse effects of meds,  Active smokers, A bunch of PE's (a small clot burden can't cause this syndrome unless there is already severe underlying pulm or vascular dz with poor reserve) plus two Bs  = Bronchiectasis and Beta blocker use..and one C= CHF  Adherence is always the initial "prime suspect" and is a multilayered concern that requires a "trust but verify" approach in every patient - starting with knowing how to use medications, especially inhalers, correctly, keeping up with refills and understanding the fundamental difference between maintenance and prns vs those medications only taken for a very short course and then stopped and not refilled.  - The proper method of use, as well as anticipated side effects, of a metered-dose inhaler are discussed and demonstrated to the patient.   - desperately needs another attempt at med reconciliation:   To keep things simple, I have asked the patient to first separate medicines that are perceived as maintenance, that is to be taken daily "no matter what", from those medicines that are taken on only on an as-needed basis and I have given the patient examples of both, and then return to see our NP to generate a  detailed  medication calendar which should be followed until the next physician sees the patient and updates it.    ? Acid (or non-acid) GERD > always  difficult to exclude as up to 75% of pts in some series report no assoc GI/ Heartburn symptoms> rec max (24h)  acid suppression and diet restrictions/ reviewed and instructions given in writing.  ? Allergy/asthma  > Prednisone 10 mg take  4 each am x 2 days,   2 each am x 2 days,  1 each am x 2 days and stop   ? Anxiety > usually at the bottom of this list of usual suspects but should be much higher on this pt's based on H and P and note already on psychotropics .   ? Active smoking > denies since 02/2016 > reinforced

## 2016-07-29 NOTE — Telephone Encounter (Signed)
Spoke with the pt and notified of recs per MW  He verbalized understanding He states he has been using o2 with sleep and will continue to do so

## 2016-08-12 ENCOUNTER — Encounter: Payer: Medicaid Other | Admitting: Adult Health

## 2016-08-19 ENCOUNTER — Telehealth: Payer: Self-pay | Admitting: Internal Medicine

## 2016-08-19 NOTE — Telephone Encounter (Signed)
Spoke with pt. He has been gradual worsening sob with some chest tightness.  Also having some increased anxiety.  Using Symbicort bid and using Albuterol neb 3 times in the past week.  Pt has appt with TP tomorrow at 11:15.  Offered to move appt up to today.  Pt states he cannot do this due to transportation.  Pt states he will keep appt for tomorrow.  Pt advised if symptoms worsen to go to ER.  Pt verbalized understanding.

## 2016-08-20 ENCOUNTER — Ambulatory Visit (INDEPENDENT_AMBULATORY_CARE_PROVIDER_SITE_OTHER): Payer: Medicaid Other | Admitting: Adult Health

## 2016-08-20 ENCOUNTER — Encounter: Payer: Self-pay | Admitting: Adult Health

## 2016-08-20 VITALS — BP 118/70 | HR 88 | Temp 97.9°F | Ht 71.0 in | Wt 152.8 lb

## 2016-08-20 DIAGNOSIS — R490 Dysphonia: Secondary | ICD-10-CM | POA: Diagnosis not present

## 2016-08-20 DIAGNOSIS — F17200 Nicotine dependence, unspecified, uncomplicated: Secondary | ICD-10-CM | POA: Diagnosis not present

## 2016-08-20 DIAGNOSIS — J449 Chronic obstructive pulmonary disease, unspecified: Secondary | ICD-10-CM

## 2016-08-20 DIAGNOSIS — K219 Gastro-esophageal reflux disease without esophagitis: Secondary | ICD-10-CM

## 2016-08-20 MED ORDER — FLUTICASONE PROPIONATE 50 MCG/ACT NA SUSP: 2.0000 | Freq: Three times a day (TID) | NASAL | 3 refills | Status: DC | PRN

## 2016-08-20 MED ORDER — BUDESONIDE-FORMOTEROL FUMARATE 160-4.5 MCG/ACT IN AERO: 2.0000 | INHALATION_SPRAY | Freq: Two times a day (BID) | RESPIRATORY_TRACT | 3 refills | Status: DC

## 2016-08-20 MED ORDER — ACLIDINIUM BROMIDE 400 MCG/ACT IN AEPB: INHALATION_SPRAY | RESPIRATORY_TRACT | 3 refills | Status: DC

## 2016-08-20 MED ORDER — OMEPRAZOLE 20 MG PO CPDR: 20.0000 mg | DELAYED_RELEASE_CAPSULE | Freq: Every day | ORAL | 3 refills | Status: DC

## 2016-08-20 NOTE — Patient Instructions (Signed)
Continue on Symbicort 2 puffs Twice daily  , rinse after use.  Continue on Tudorza 1 puff Twice daily  , rinse after use.  Great job on not smoking .  Refer to ENT for hoarseness. .  Refer to GI  -to reschedule.  Follow med calendar closely and bring to each visit.  Follow up Dr. [name]  In 3 -4 months and As needed   Please contact office for sooner follow up if symptoms do not improve or worsen or seek emergency care

## 2016-08-20 NOTE — Progress Notes (Signed)
Subjective:    Patient ID: Tony Sullivan, male    DOB: 1961/06/06  MRN: [id number]    Brief patient profile:  55  yobm quit smoking 03/17/16 with breathing difficulties since 2004 dx as copd on advair 250 /50 but getting worse since 2014 and placed on 02 for the first time in March 2015 and referred 01/07/2014 to pulmonary clinic by Dr Kennon Holter with GOLD II copd criteria established 02/14/14    History of Present Illness  01/07/2014 1st [facility] office visit/ [facility]  Chief Complaint  Patient presents with  . Advice Only    Referred for COPD, SOB.  Pt c/o SOB, low 02, prod cough with white mucous.  Pt forgot med list, unsure of what all he is taking  still can do a [employer] but uses [facility] parking, assoc with congested coughing esp in am worse in winter. rec Stop advair   Plan A =  Automatic =  symbicort 160 Take 2 puffs first thing in am and then another 2 puffs about 12 hours later. spiriva in am only  Plan B = Backup- Proventil use it only if you can't your breath - ok to use up to every 4 hours if needed Plan C = Nebulizer up to every 4 hours if needed only if you try plan B first and it doesn't work   02/14/2014 f/u ov/Wert re: copd GOLD II on ACEi and Advair and still smoking  Chief Complaint  Patient presents with  . Follow-up    Review pft.  Pt c/o prod cough with clear mucuos, SOB with exertion.  Interested in getting a portable 02 concentrator.    rec Plan A =  Automatic =  symbicort 160 Take 2 puffs first thing in am and then another 2 puffs about 12 hours later.                                       spiriva in am only  Plan B = Backup- Proventil use it only if you can't your breath - ok to use up to every 4 hours if needed Plan C = Nebulizer up to every 4 hours if needed only if you try plan B first and it doesn't work The key is to stop smoking completely before smoking completely stops you! 02 should be 3lpm with more than 100 ft walking  Please schedule a follow  up visit in 3 months but call sooner if needed    06/15/2014 f/u ov/Wert re: still smoking/ GOLD II copd 02 dep with ambulation / on spiriva/symbicort Chief Complaint  Patient presents with  . Follow-up    Pt reports that cough and SOB has been more increased recently d/t weather change. Using 2.5Liter O2  No need for saba yet in any form / dry hacking cough  rec Stop lisinopril Start valsartan 160 mg one daily  Please see patient coordinator before you leave today  to schedule ambulatory portable system that will deliver  02 at 2.5 lpm to replace your tank     09/15/2014 Follow up and Med Calendar GOLD II COPD s/p exac to ER / still smoking on symbicort and spiriva  Patient returns for a follow-up and medication review. We reviewed all his medications organize them into a medication count with patient education It appears that he is taking his medications correctly but is out , has follow  up with PCP for refills.  Has cut back on smoking .  Doing well w/ no flare of cough or wheezing/dyspnea.  Last visit spiriva was stopped, no flare off inhaler.  Needs refill on omeprazole , Zyrtec and proventil  rec Follow med calendar closely and bring to each visit. Continue current regimen. Continue to work on not smoking.    02/20/2015 f/u ov/Wert re: GOLD II / no med calendar/ no meds / still smoking  Chief Complaint  Patient presents with  . Follow-up    Pt c/o of SOB with excertion, wheezing, dry cough. Pt stopped spirvia, and uses nebs and albuterol prn.  no real change doe/ MMRC Grad 1/2 minimal variability  better on 3lpm when he uses it Easily confused with details of care and what he is vs not taking vs has run out - using multiple doses of saba in hfa and neb form more day than noct / saba helps some  >change o2 As needed  With act   03/22/2015 Follow up : COPD /GOLD II  Pt returns for 1 month follow up .  Says he still gets winded easily.  Has cough on/off. No hemoptysis ,  chest pain or orthopnea.  Remains on Symbicort Twice daily  .  Remains on  O2 with activity. Does still wear at bedtime .  Uses rescue inhaler most days.  Still smoking . Discussed cessation.  rec Continue on Symbicort 2 puffs Twice daily  , rinse after use.  Begin Tudorza 1 puff Twice daily  , rinse after use.  Work on not smoking   07/31/2015  f/u ov/Wert re: GOLD II/ still smoking / over use of saba daily  Chief Complaint  Patient presents with  . Follow-up    Pt reports breathing is fair. C/o prod cough (green phlem x last week), wheezing, chest tx in early AM.    gets panicky when walking outdoors  and uses lots of nebs since doesn't have an hfa saba  Doe = MMRC2 = can't walk a nl pace on a flat grade s sob/ no worse with cough worse this past week  rec Prevnar 13 and flu shots today Plan A =  Automatic =  symbicort 160 Take 2 puffs first thing in am and then another 2 puffs about 12 hours later and tudorza one twice daily as well                Plan B = Backup- Proair use it only if you can't your breath - ok to use up to 2 pffs  every 4 hours if needed Plan C = Nebulizer up to every 4 hours if needed only if you try plan B first and it doesn't work The key is to stop smoking completely before smoking completely stops you! No need for 02 at bedtime or with nl activities, use 3lpm with heavy exertion as needed  Please see patient coordinator before you leave today  to schedule pulmonary rehab > declined     12/05/2015  f/u ov/Wert re: GOLD II / wears 02 2lpm at hs and when walks to store  Chief Complaint  Patient presents with  . Acute Visit    Pt c/o CP for the past 2 months- comes and goes. He states he has burning in the center of his chest and sharp pain on the left. He also c/o more SOB than usual and has been using proair 4-6 x per day on average.   no  proair today / MMRC2 = can't walk a nl pace on a flat grade s sob does ok flat on 2lpm to store  rec Stop omeprazole and  take pepcid 20 mg after breakfast and supper  Avoid  foods that cause gas (especially beans boiled eggs/and raw vegetables like spinach and salads)  and citrucel 1 heaping tsp twice daily with a large glass of water.  Pain should improve w/in 2 weeks  Congratulations on not smoking, it's the most important aspect of your care  No need for 02 except for walks over 200 ft > use 2lpm while walking but pace yourself Only use your albuterol as a rescue medication      04/01/2016  Extended f/u ov/Wert re: copd gold II/ 02 prn / cp resolved/ multiple er trips for copd Chief Complaint  Patient presents with  . Follow-up    Breathing is unchanged. He c/o congestion today and has minimal cough with clear sputum. He is using proair inhaler and neb both approx 4 x per day on average.   way over using  saba esp in am with lots of congestion/rattling up to an hour and doesn't get around to using symb for hours / not timing ppi ac as rec  rec Ok to stop omeprazole when it runs out and just take pepcid at or after bfast and supper For cough/ congestion > flutter valve as much as possible Plan A = Automatic =  symbicort 160 Take 2 puffs first thing in am and then another 2 puffs about 12 hours later and chase symbicort with the tudorza  Plan B = Backup Only use your albuterol as a rescue medication  Plan C = Crisis - only use your albuterol nebulizer if you first try Plan B and it fails to help > ok to use the nebulizer up to every 4 hours but if start needing it regularly call for immediate appointment Plan D = Doctor - call me if B and C not adequate Plan E = ER - go to ER or call 911 if all else fails      07/29/2016  f/u ov/Wert re:  02 2lpm  Chief Complaint  Patient presents with  . Follow-up    Breathing has been worse for the past few days.  He states that he has mucus stuck in his throat all of the time. He is using albuterol inhaler 2 x daily and neb with albuterol 3 x daily on average.    from 2 am - 7 am x since May/June and wakes up can't get breath even with nebulizer Not wearing 02 appropriately - leaves it off, gets sob and overuses saba then gets shaky and nervous and "can't breathe" >>pred taper .   08/20/2016 Follow up : COPD , O2 depend RF  Pt returns for a 2 week follow up and med review  We reviewed all his meds and organized them into a med calendar with pt educaiton  Appears to be taking correctly.  He had a copd flare last ov , given prednisone taper. Says he is feeling better w/ decreased cough and dyspnea. Trying to walk daily .  Has not smoked any . Smoking cessation encouraged.   Not interested in pulmon rehab , too expensive.  Wants referral to ENT for chronic hoarseness. Says he feels he has been hoarse for several months and will not go away.  Was suppose to see GI, wants [place] to help reschedule his visit . He continues  to have some swallow issues at times. Feels full all the time, gets hungry then after couple of bites feels too full.    Current Medications, Allergies, Complete Past Medical History, Past Surgical History, Family History, and Social History were reviewed in [employer] record.  ROS  The following are not active complaints unless bolded sore throat, dysphagia, dental problems, itching, sneezing,  nasal congestion or excess/ purulent secretions, ear ache,   fever, chills, sweats, unintended wt loss, classically pleuritic or exertional cp,  orthopnea pnd or leg swelling, presyncope, palpitations, abdominal pain, anorexia, nausea, vomiting, diarrhea  or change in bowel or bladder habits, change in stools or urine, dysuria,hematuria,  rash, arthralgias, visual complaints, headache, numbness, weakness or ataxia or problems with walking or coordination,  change in mood/affect or memory.                            Objective:   Physical Exam   06/15/2014  164  >  09/15/2014 158 >   02/20/2015 >156 03/22/2015 >   07/31/2015   150 > 12/05/2015  161   > 04/01/2016    151 > 07/29/2016  152   Vitals:   08/20/16 1043  BP: 118/70  Pulse: 88  Temp: 97.9 F (36.6 C)  TempSrc: Oral  SpO2: 94%  Weight: 152 lb 12.8 oz (69.3 kg)  Height: '5\' 11"'$  (1.803 m)     amb mild hoarse bm nad   Vital signs reviewed   HEENT mild turbinate edema.  Oropharynx no thrush or excess pnd or cobblestoning.  No JVD or cervical adenopathy. Mild accessory muscle hypertrophy. Trachea midline, nl thryroid. Chest was hyperinflated by percussion with diminished breath sounds and moderate increased exp time without wheeze. Hoover sign positive at mid inspiration. Regular rate and rhythm without murmur gallop or rub or increase P2 or edema.  Abd: no hsm, nl excursion. Ext warm without cyanosis- mod bilateral clubbing      CXR:  07/17/16 Prominent emphysematous changes and chronic bronchitic changes in the lungs. No evidence of active pulmonary disease

## 2016-08-22 DIAGNOSIS — R490 Dysphonia: Secondary | ICD-10-CM | POA: Insufficient documentation

## 2016-08-22 DIAGNOSIS — K219 Gastro-esophageal reflux disease without esophagitis: Secondary | ICD-10-CM | POA: Insufficient documentation

## 2016-08-22 NOTE — Assessment & Plan Note (Signed)
Recent flare now resolving  Encouraged on smoking cessation   Plan  Patient Instructions  Continue on Symbicort 2 puffs Twice daily  , rinse after use.  Continue on Tudorza 1 puff Twice daily  , rinse after use.  Great job on not smoking .  Refer to ENT for hoarseness. .  Refer to GI  -to reschedule.  Follow med calendar closely and bring to each visit.  Follow up Dr. [name]  In 3 -4 months and As needed   Please contact office for sooner follow up if symptoms do not improve or worsen or seek emergency care

## 2016-08-22 NOTE — Assessment & Plan Note (Signed)
Chronic hoarseness >6 month former smoker  Refer to ENT

## 2016-08-22 NOTE — Assessment & Plan Note (Signed)
GERD /early satiety  Refer to GI  Cont on PPI

## 2016-09-09 NOTE — Addendum Note (Signed)
Addended by: Doroteo Glassman D on: 09/09/2016 10:32 AM   Modules accepted: Orders

## 2016-10-17 ENCOUNTER — Encounter: Payer: Self-pay | Admitting: Internal Medicine

## 2016-10-17 ENCOUNTER — Ambulatory Visit (INDEPENDENT_AMBULATORY_CARE_PROVIDER_SITE_OTHER): Payer: Medicaid Other | Admitting: Internal Medicine

## 2016-10-17 ENCOUNTER — Telehealth: Payer: Self-pay | Admitting: Internal Medicine

## 2016-10-17 VITALS — BP 102/68 | HR 80 | Temp 98.1°F | Ht 71.0 in | Wt 154.0 lb

## 2016-10-17 DIAGNOSIS — Z1211 Encounter for screening for malignant neoplasm of colon: Secondary | ICD-10-CM | POA: Diagnosis not present

## 2016-10-17 DIAGNOSIS — R1013 Epigastric pain: Secondary | ICD-10-CM | POA: Diagnosis not present

## 2016-10-17 DIAGNOSIS — Z9981 Dependence on supplemental oxygen: Secondary | ICD-10-CM | POA: Diagnosis not present

## 2016-10-17 DIAGNOSIS — G8929 Other chronic pain: Secondary | ICD-10-CM

## 2016-10-17 MED ORDER — NA SULFATE-K SULFATE-MG SULF 17.5-3.13-1.6 GM/177ML PO SOLN: 1.0000 | ORAL | 0 refills | Status: AC

## 2016-10-17 NOTE — Patient Instructions (Signed)

## 2016-10-17 NOTE — Progress Notes (Signed)
HISTORY OF PRESENT ILLNESS:  Tony Sullivan is a 55 y.o. male with advanced oxygen-dependent COPD who is referred today by Dr. Ouida Sills with a chief complaint of wanting colon cancer screening in the form of colonoscopy and chronic epigastric pain. He is accompanied by his girlfriend. Patient continues to smoke. First, he reports a 6 month history of intermittent epigastric fullness sensation noticed principally in the morning upon wakening. Symptoms are not affected by meals. He has had decreased appetite associated with slight weight loss. Also bloating. He denies melena or hematochezia. No change in bowel habits. No prior colon cancer screening but feels this is important as there is a history of cancer in his family.Has a history of GERD and has been placed on PPI therapy by his pulmonologist. He is also been evaluated by ENT for hoarseness as recently as last month.  REVIEW OF SYSTEMS:  All non-GI ROS negative except for shortness of breath, depression, cough, arthritis  Past Medical History:  Diagnosis Date  . Asthma   . COPD (chronic obstructive pulmonary disease) ([place]) 2004   diagnosed in 2004, no PFT's to date.  Started on home O2 12/2013, after found to be desatting at PCP's office, and referred to pulmonology.  . High blood pressure   . Seasonal allergies     Past Surgical History:  Procedure Laterality Date  . APPENDECTOMY  1980  . HIP SURGERY Right   . SHOULDER ARTHROSCOPY Right     Social History Tony Sullivan  reports that he has been smoking Cigarettes.  He has a 36.00 pack-year smoking history. He has never used smokeless tobacco. He reports that he drinks about 8.4 - 12.6 oz of alcohol per week . He reports that he does not use drugs.  family history includes Allergies in his father, mother, and son; Asthma in his mother; Cancer in his mother; Clotting disorder in his mother; Emphysema in his father and mother; Heart disease in his maternal aunt and mother.  Allergies   Allergen Reactions  . Penicillins Anaphylaxis    Has patient had a PCN reaction causing immediate rash, facial/tongue/throat swelling, SOB or lightheadedness with hypotension: Yes Has patient had a PCN reaction causing severe rash involving mucus membranes or skin necrosis: No Has patient had a PCN reaction that required hospitalization No Has patient had a PCN reaction occurring within the last 10 years: No If all of the above answers are "NO", then may proceed with Cephalosporin use.   . Sulfa Antibiotics Swelling    Swelling of hands and throat   . Famotidine Other (See Comments)    Bloating of stomach, loss of appetite  . Levocetirizine Other (See Comments)    Muscle cramps       PHYSICAL EXAMINATION: Vital signs: BP 102/68 (BP Location: Left Arm, Patient Position: Sitting, Cuff Size: Normal)   Pulse 80   Temp 98.1 F (36.7 C) (Oral)   Ht '5\' 11"'$  (1.803 m)   Wt 154 lb (69.9 kg)   BMI 21.48 kg/m   Constitutional: Chronically ill-appearing, nasal cannula oxygen, thin, no acute distress Psychiatric: alert and oriented x3, cooperative Eyes: extraocular movements intact, anicteric, conjunctiva pink Mouth: oral pharynx moist, no lesions Neck: supple no lymphadenopathy Cardiovascular: heart regular rate and rhythm, no murmur Lungs: clear to auscultation bilaterally Abdomen: soft, nontender, nondistended, no obvious ascites, no peritoneal signs, normal bowel sounds, no organomegaly Rectal: Deferred Extremities: no clubbing cyanosis or lower extremity edema bilaterally Skin: no lesions on visible extremities Neuro: No focal  deficits. No asterixis.   ASSESSMENT:  #1. Colon cancer screening. I discussed with him in great detail different options but focused on FIT stool testing, Cologuard, and optical colonoscopy. I told him that I favored one of the stool strategies given his lung disease and understanding that a positive test would elicit the recommendation for colonoscopy.  Despite this, the patient wanted to proceed with optical colonoscopy directly. He understands that this would have to be done at [facility] he is high-risk.The nature of the procedure, as well as the risks, benefits, and alternatives were carefully and thoroughly reviewed with the patient. Ample time for discussion and questions allowed. The patient understood, was satisfied, and agreed to proceed.  #2. Vague epigastric fullness. Bloating. Based on description suspect this is related to his intrinsic lung disease. Recommend stop smoking  #3. GERD by history. No symptoms on PPI. Recommend reflux precautions  #4. History of hoarseness. Has been evaluated by ENT. Stop smoking recommended  A copy of this consultation note has been sent to Dr. Ouida Sills

## 2016-10-17 NOTE — Telephone Encounter (Signed)
Attempted to contact pt. Received a message, "The person you are trying to reach can't take calls at this time." Will try back later.

## 2016-10-17 NOTE — Telephone Encounter (Signed)
Tried to call pt. But message stated person you are trying to reach is not accepting any calls will try again later.

## 2016-10-18 NOTE — Telephone Encounter (Signed)
Attempted to contact the pt but the phone stated that this person is not accepting any calls at this time x  3.  Will sign off of this message.

## 2016-11-29 ENCOUNTER — Other Ambulatory Visit: Payer: Self-pay | Admitting: Adult Health

## 2016-12-05 ENCOUNTER — Encounter: Payer: Self-pay | Admitting: Internal Medicine

## 2016-12-05 ENCOUNTER — Ambulatory Visit (INDEPENDENT_AMBULATORY_CARE_PROVIDER_SITE_OTHER): Payer: Medicaid Other | Admitting: Internal Medicine

## 2016-12-05 VITALS — BP 114/80 | HR 82 | Ht 71.0 in | Wt 156.0 lb

## 2016-12-05 DIAGNOSIS — J9611 Chronic respiratory failure with hypoxia: Secondary | ICD-10-CM | POA: Diagnosis not present

## 2016-12-05 DIAGNOSIS — J449 Chronic obstructive pulmonary disease, unspecified: Secondary | ICD-10-CM | POA: Diagnosis not present

## 2016-12-05 MED ORDER — PREDNISONE 10 MG PO TABS: ORAL_TABLET | ORAL | 11 refills | Status: DC

## 2016-12-05 MED ORDER — ALBUTEROL SULFATE HFA 108 (90 BASE) MCG/ACT IN AERS: INHALATION_SPRAY | RESPIRATORY_TRACT | 11 refills | Status: DC

## 2016-12-05 MED ORDER — ACLIDINIUM BROMIDE 400 MCG/ACT IN AEPB: 1.0000 | INHALATION_SPRAY | Freq: Two times a day (BID) | RESPIRATORY_TRACT | 11 refills | Status: DC

## 2016-12-05 MED ORDER — BUDESONIDE-FORMOTEROL FUMARATE 160-4.5 MCG/ACT IN AERO: INHALATION_SPRAY | RESPIRATORY_TRACT | 11 refills | Status: DC

## 2016-12-05 NOTE — Patient Instructions (Signed)
When having to use more of  Plan C than baseline rec Prednisone 10 mg take  4 each am x 2 days,   2 each am x 2 days,  1 each am x 2 days and stop   See calendar for specific medication instructions and bring it back for each and every office visit for every healthcare provider you see.  Without it,  you may not receive the best quality medical care that we feel you deserve.  You will note that the calendar groups together  your maintenance  medications that are timed at particular times of the day.  Think of this as your checklist for what your doctor has instructed you to do until your next evaluation to see what benefit  there is  to staying on a consistent group of medications intended to keep you well.  The other group at the bottom is entirely up to you to use as you see fit  for specific symptoms that may arise between visits that require you to treat them on an as needed basis.  Think of this as your action plan or "what if" list.   Separating the top medications from the bottom group is fundamental to providing you adequate care going forward.    Please schedule a follow up visit in 3 months but call sooner if needed with your med calendar and all meds in hand to see Tammy NP

## 2016-12-05 NOTE — Progress Notes (Signed)
Subjective:    Patient ID: Tony Sullivan, male    DOB: [birth year]  MRN: [id number]    Brief patient profile:  55  yobm quit smoking 03/17/16 with breathing difficulties since 2004 dx as copd on advair 250 /50 but getting worse since 2014 and placed on 02 for the first time in March 2015 and referred 01/07/2014 to pulmonary clinic by Dr Kennon Holter with GOLD II copd criteria established 02/14/14    History of Present Illness  01/07/2014 1st [facility] office visit/ Stanly Si  Chief Complaint  Patient presents with  . Advice Only    Referred for COPD, SOB.  Pt c/o SOB, low 02, prod cough with white mucous.  Pt forgot med list, unsure of what all he is taking  still can do a [employer] but uses [facility] parking, assoc with congested coughing esp in am worse in winter. rec Stop advair   Plan A =  Automatic =  symbicort 160 Take 2 puffs first thing in am and then another 2 puffs about 12 hours later. spiriva in am only  Plan B = Backup- Proventil use it only if you can't your breath - ok to use up to every 4 hours if needed Plan C = Nebulizer up to every 4 hours if needed only if you try plan B first and it doesn't work   02/14/2014 f/u ov/Doloras Tellado re: copd GOLD II on ACEi and Advair and still smoking  Chief Complaint  Patient presents with  . Follow-up    Review pft.  Pt c/o prod cough with clear mucuos, SOB with exertion.  Interested in getting a portable 02 concentrator.    rec Plan A =  Automatic =  symbicort 160 Take 2 puffs first thing in am and then another 2 puffs about 12 hours later.                                       spiriva in am only  Plan B = Backup- Proventil use it only if you can't your breath - ok to use up to every 4 hours if needed Plan C = Nebulizer up to every 4 hours if needed only if you try plan B first and it doesn't work The key is to stop smoking completely before smoking completely stops you! 02 should be 3lpm with more than 100 ft walking  Please schedule a follow  up visit in 3 months but call sooner if needed    06/15/2014 f/u ov/Amarie Tarte re: still smoking/ GOLD II copd 02 dep with ambulation / on spiriva/symbicort Chief Complaint  Patient presents with  . Follow-up    Pt reports that cough and SOB has been more increased recently d/t weather change. Using 2.5Liter O2  No need for saba yet in any form / dry hacking cough  rec Stop lisinopril Start valsartan 160 mg one daily  Please see patient coordinator before you leave today  to schedule ambulatory portable system that will deliver  02 at 2.5 lpm to replace your tank     09/15/2014 Follow up and Med Calendar GOLD II COPD s/p exac to ER / still smoking on symbicort and spiriva  Patient returns for a follow-up and medication review. We reviewed all his medications organize them into a medication count with patient education It appears that he is taking his medications correctly but is out , has follow  up with PCP for refills.  Has cut back on smoking .  Doing well w/ no flare of cough or wheezing/dyspnea.  Last visit spiriva was stopped, no flare off inhaler.  Needs refill on omeprazole , Zyrtec and proventil  rec Follow med calendar closely and bring to each visit. Continue current regimen. Continue to work on not smoking.    02/20/2015 f/u ov/Jersey Espinoza re: GOLD II / no med calendar/ no meds / still smoking  Chief Complaint  Patient presents with  . Follow-up    Pt c/o of SOB with excertion, wheezing, dry cough. Pt stopped spirvia, and uses nebs and albuterol prn.  no real change doe/ MMRC Grad 1/2 minimal variability  better on 3lpm when he uses it Easily confused with details of care and what he is vs not taking vs has run out - using multiple doses of saba in hfa and neb form more day than noct / saba helps some  >change o2 As needed  With act   03/22/2015 Follow up : COPD /GOLD II  Pt returns for 1 month follow up .  Says he still gets winded easily.  Has cough on/off. No hemoptysis ,  chest pain or orthopnea.  Remains on Symbicort Twice daily  .  Remains on  O2 with activity. Does still wear at bedtime .  Uses rescue inhaler most days.  Still smoking . Discussed cessation.  rec Continue on Symbicort 2 puffs Twice daily  , rinse after use.  Begin Tudorza 1 puff Twice daily  , rinse after use.  Work on not smoking   07/31/2015  f/u ov/Adrine Hayworth re: GOLD II/ still smoking / over use of saba daily  Chief Complaint  Patient presents with  . Follow-up    Pt reports breathing is fair. C/o prod cough (green phlem x last week), wheezing, chest tx in early AM.    gets panicky when walking outdoors  and uses lots of nebs since doesn't have an hfa saba  Doe = MMRC2 = can't walk a nl pace on a flat grade s sob/ no worse with cough worse this past week  rec Prevnar 13 and flu shots today Plan A =  Automatic =  symbicort 160 Take 2 puffs first thing in am and then another 2 puffs about 12 hours later and tudorza one twice daily as well                Plan B = Backup- Proair use it only if you can't your breath - ok to use up to 2 pffs  every 4 hours if needed Plan C = Nebulizer up to every 4 hours if needed only if you try plan B first and it doesn't work The key is to stop smoking completely before smoking completely stops you! No need for 02 at bedtime or with nl activities, use 3lpm with heavy exertion as needed  Please see patient coordinator before you leave today  to schedule pulmonary rehab > declined     12/05/2015  f/u ov/Alma Mohiuddin re: GOLD II / wears 02 2lpm at hs and when walks to store  Chief Complaint  Patient presents with  . Acute Visit    Pt c/o CP for the past 2 months- comes and goes. He states he has burning in the center of his chest and sharp pain on the left. He also c/o more SOB than usual and has been using proair 4-6 x per day on average.   no  proair today / MMRC2 = can't walk a nl pace on a flat grade s sob does ok flat on 2lpm to store  rec Stop omeprazole and  take pepcid 20 mg after breakfast and supper  Avoid  foods that cause gas (especially beans boiled eggs/and raw vegetables like spinach and salads)  and citrucel 1 heaping tsp twice daily with a large glass of water.  Pain should improve w/in 2 weeks  Congratulations on not smoking, it's the most important aspect of your care  No need for 02 except for walks over 200 ft > use 2lpm while walking but pace yourself Only use your albuterol as a rescue medication      04/01/2016  Extended f/u ov/Jasson Siegmann re: copd gold II/ 02 prn / cp resolved/ multiple er trips for copd Chief Complaint  Patient presents with  . Follow-up    Breathing is unchanged. He c/o congestion today and has minimal cough with clear sputum. He is using proair inhaler and neb both approx 4 x per day on average.   way over using  saba esp in am with lots of congestion/rattling up to an hour and doesn't get around to using symb for hours / not timing ppi ac as rec  rec Ok to stop omeprazole when it runs out and just take pepcid at or after bfast and supper For cough/ congestion > flutter valve as much as possible Plan A = Automatic =  symbicort 160 Take 2 puffs first thing in am and then another 2 puffs about 12 hours later and chase symbicort with the tudorza  Plan B = Backup Only use your albuterol as a rescue medication  Plan C = Crisis - only use your albuterol nebulizer if you first try Plan B and it fails to help > ok to use the nebulizer up to every 4 hours but if start needing it regularly call for immediate appointment Plan D = Doctor - call me if B and C not adequate Plan E = ER - go to ER or call 911 if all else fails      07/29/2016  f/u ov/Sheri Prows re:  COPD GOLD II / 02 2lpm at hs  Chief Complaint  Patient presents with  . Follow-up    Breathing has been worse for the past few days.  He states that he has mucus stuck in his throat all of the time. He is using albuterol inhaler 2 x daily and neb with albuterol 3 x  daily on average.   from 2 am - 7 am x since May/June and wakes up can't get breath even with nebulizer Not wearing 02 appropriately - leaves it off, gets sob and overuses saba then gets shaky and nervous and "can't breathe" >>pred taper .prn     08/20/2016 NP  Follow up : COPD , O2 depend RF  Pt returns for a 2 week follow up and med review  We reviewed all his meds and organized them into a med calendar with pt educaiton  Appears to be taking correctly.  He had a copd flare last ov , given prednisone taper. Says he is feeling better w/ decreased cough and dyspnea. Trying to walk daily .  Has not smoked any . Smoking cessation encouraged.  Not interested in pulmon rehab , too expensive.  Wants referral to ENT for chronic hoarseness. Says he feels he has been hoarse for several months and will not go away.  Was suppose to see GI, wants  [place] to help reschedule his visit . He continues to have some swallow issues at times. Feels full all the time, gets hungry then after couple of bites feels too full.  rec Continue on Symbicort 2 puffs Twice daily  , rinse after use.  Continue on Tudorza 1 puff Twice daily  , rinse after use.  Great job on not smoking .  Refer to ENT for hoarseness. .  Refer to GI  -to reschedule.  Follow med calendar closely and bring to each visit.    12/05/2016  f/u ov/Lawayne Hartig re:   GOLD II copd / 02 2lpm hs and prn, symb 160 / tudorza - no med calendar Chief Complaint  Patient presents with  . Follow-up    Breathing is unchanged. He states using his albuterol inhaler 2 x daily on average and neb 2-3 x per wk on average. He states "I cough all the time"- prod with minimal clear sputum.   moslty uses hfa saba when overdoes it, does not have prn pred  Though that was the prev intent  Doe = MMRC2 = can't walk a nl pace on a flat grade s sob but does fine slow and flat eg shopping (on 2lpm )   No obvious day to day or daytime variability or assoc excess/ purulent sputum or  mucus plugs or hemoptysis or cp or chest tightness, subjective wheeze or overt sinus or hb symptoms. No unusual exp hx or h/o childhood pna/ asthma or knowledge of premature birth.  Sleeping ok without nocturnal  or early am exacerbation  of respiratory  c/o's or need for noct saba. Also denies any obvious fluctuation of symptoms with weather or environmental changes or other aggravating or alleviating factors except as outlined above   Current Medications, Allergies, Complete Past Medical History, Past Surgical History, Family History, and Social History were reviewed in [employer] record.  ROS  The following are not active complaints unless bolded sore throat, dysphagia, dental problems, itching, sneezing,  nasal congestion or excess/ purulent secretions, ear ache,   fever, chills, sweats, unintended wt loss, classically pleuritic or exertional cp,  orthopnea pnd or leg swelling, presyncope, palpitations, abdominal pain, anorexia, nausea, vomiting, diarrhea  or change in bowel or bladder habits, change in stools or urine, dysuria,hematuria,  rash, arthralgias, visual complaints, headache, numbness, weakness or ataxia or problems with walking or coordination,  change in mood/affect or memory.                         Objective:   Physical Exam  Moderately hoarse amb bm nad   06/15/2014  164  >  09/15/2014 158 >   02/20/2015 >156 03/22/2015 >  07/31/2015   150 > 12/05/2015  161   > 04/01/2016    151 > 07/29/2016  152 > 156   Vital signs reviewed   - Note on arrival 02 sats  95% on 1lpm    HEENT: nl dentition, turbinates, and oropharynx. Nl external ear canals without cough reflex   NECK :  without JVD/Nodes/TM/ nl carotid upstrokes bilaterally   LUNGS: no acc muscle use,  Nl contour chest which is clear to A and P bilaterally without cough on insp or exp maneuvers   CV:  RRR  no s3 or murmur or increase in P2, nad no edema   ABD:  soft and nontender with nl  inspiratory excursion in the supine position. No bruits or organomegaly appreciated, bowel sounds  nl  MS:  Nl gait/ ext warm without deformities, calf tenderness, cyanosis or clubbing No obvious joint restrictions   SKIN: warm and dry without lesions    NEURO:  alert, approp, nl sensorium with  no motor or cerebellar deficits apparent.            I personally reviewed images and agree with radiology impression as follows:  CXR:   07/17/16 Prominent emphysematous changes and chronic bronchitic changes in the lungs. No evidence of active pulmonary disease

## 2016-12-06 ENCOUNTER — Encounter (HOSPITAL_COMMUNITY): Payer: Self-pay | Admitting: *Deleted

## 2016-12-09 ENCOUNTER — Encounter: Payer: Self-pay | Admitting: Internal Medicine

## 2016-12-09 NOTE — Assessment & Plan Note (Signed)
-   02/14/2014   try symbicort 160 2bid  - PFT's  02/14/2014   FEV1 1.82 (53%) and ratio 47 no better p alb (p spiriva and symbicort 160)  and DLCO 32 with 37%  07/2014 >Spiriva stopped  09/15/2014 Med calendar> not using 02/20/2015   - 02/20/2015 p extensive coaching HFA effectiveness =    90%  - Quit smoking 02/2016  - flutter valve 04/01/2016  -med calendar 08/20/16  - added prn pred to action plan 12/05/2016   Misunderstanding how/ when to use saba - instruction was to use it when he can't catch his breath, not just for doe reproducible levels which is expected here.  I had an extended discussion with the patient reviewing all relevant studies completed to date and  lasting 15 to 20 minutes of a 25 minute visit    Each maintenance medication was reviewed in detail including most importantly the difference between maintenance and prns and under what circumstances the prns are to be triggered using an action plan format that is not reflected in the computer generated alphabetically organized AVS but trather by a customized med calendar that reflects the AVS meds with confirmed 100% correlation.   In addition, Please see AVS for unique instructions that I personally wrote and verbalized to the the pt in detail and then reviewed with pt  by my nurse highlighting any  changes in therapy recommended at today's visit to their plan of care.

## 2016-12-09 NOTE — Assessment & Plan Note (Signed)
-   started on 27 December 2013 by [facility] clinic - 02/14/2014  Walked RA x 3/4  laps @ 185 ft each stopped due to  desat 85% and corrected on 3lpm to x 2 laps  - 06/15/2014  Walked 2.5lpm  2 laps  @ 185 ft each stopped due to  Sob adequate sats  - 02/20/2015  Walked RA x 3 laps @ 185 ft each stopped due to  Sob/desat p 2 laps, nl pace, eliminated on his 02 at 3lpm  - 12/05/2015   Walked RA  2 laps @ 185 ft each stopped due to  desat to 88% and corrected on 2lpm   rec as of 12/05/2016 =  2lpm walking more than 200 ft and 2lpm hs

## 2016-12-10 ENCOUNTER — Ambulatory Visit (HOSPITAL_COMMUNITY): Payer: Medicaid Other | Admitting: Registered Nurse

## 2016-12-10 ENCOUNTER — Ambulatory Visit (HOSPITAL_COMMUNITY)
Admission: RE | Admit: 2016-12-10 | Discharge: 2016-12-10 | Disposition: A | Discharge status: Home / Self Care | Payer: Medicaid Other | Source: Ambulatory Visit | Attending: Internal Medicine | Admitting: Internal Medicine

## 2016-12-10 ENCOUNTER — Encounter (HOSPITAL_COMMUNITY): Payer: Self-pay | Admitting: *Deleted

## 2016-12-10 ENCOUNTER — Encounter (HOSPITAL_COMMUNITY)
Admission: RE | Disposition: A | Discharge status: Home / Self Care | Payer: Self-pay | Source: Ambulatory Visit | Attending: Internal Medicine

## 2016-12-10 DIAGNOSIS — F17210 Nicotine dependence, cigarettes, uncomplicated: Secondary | ICD-10-CM | POA: Insufficient documentation

## 2016-12-10 DIAGNOSIS — Z1211 Encounter for screening for malignant neoplasm of colon: Secondary | ICD-10-CM

## 2016-12-10 DIAGNOSIS — I10 Essential (primary) hypertension: Secondary | ICD-10-CM | POA: Diagnosis not present

## 2016-12-10 DIAGNOSIS — D124 Benign neoplasm of descending colon: Secondary | ICD-10-CM | POA: Diagnosis not present

## 2016-12-10 DIAGNOSIS — Z880 Allergy status to penicillin: Secondary | ICD-10-CM | POA: Insufficient documentation

## 2016-12-10 DIAGNOSIS — Z1212 Encounter for screening for malignant neoplasm of rectum: Secondary | ICD-10-CM

## 2016-12-10 DIAGNOSIS — Z882 Allergy status to sulfonamides status: Secondary | ICD-10-CM | POA: Diagnosis not present

## 2016-12-10 DIAGNOSIS — J449 Chronic obstructive pulmonary disease, unspecified: Secondary | ICD-10-CM | POA: Insufficient documentation

## 2016-12-10 DIAGNOSIS — K5730 Diverticulosis of large intestine without perforation or abscess without bleeding: Secondary | ICD-10-CM | POA: Insufficient documentation

## 2016-12-10 DIAGNOSIS — D122 Benign neoplasm of ascending colon: Secondary | ICD-10-CM | POA: Diagnosis not present

## 2016-12-10 DIAGNOSIS — Z9981 Dependence on supplemental oxygen: Secondary | ICD-10-CM | POA: Insufficient documentation

## 2016-12-10 DIAGNOSIS — Z888 Allergy status to other drugs, medicaments and biological substances status: Secondary | ICD-10-CM | POA: Insufficient documentation

## 2016-12-10 HISTORY — PX: COLONOSCOPY WITH PROPOFOL: SHX5780

## 2016-12-10 SURGERY — COLONOSCOPY WITH PROPOFOL
Anesthesia: Monitor Anesthesia Care

## 2016-12-10 MED ORDER — LIDOCAINE 2% (20 MG/ML) 5 ML SYRINGE
INTRAMUSCULAR | Status: AC
  Filled 2016-12-10: qty 5

## 2016-12-10 MED ORDER — PROPOFOL 10 MG/ML IV BOLUS
INTRAVENOUS | Status: AC
  Filled 2016-12-10: qty 20

## 2016-12-10 MED ORDER — SODIUM CHLORIDE 0.9 % IV SOLN: INTRAVENOUS | Status: DC

## 2016-12-10 MED ORDER — LACTATED RINGERS IV SOLN
INTRAVENOUS | Status: DC
  Administered 2016-12-10: 1000 mL via INTRAVENOUS

## 2016-12-10 MED ORDER — PROPOFOL 10 MG/ML IV BOLUS
INTRAVENOUS | Status: AC
  Filled 2016-12-10: qty 40

## 2016-12-10 MED ORDER — PROPOFOL 500 MG/50ML IV EMUL
INTRAVENOUS | Status: DC | PRN
  Administered 2016-12-10: 140 ug/kg/min via INTRAVENOUS

## 2016-12-10 MED ORDER — LIDOCAINE 2% (20 MG/ML) 5 ML SYRINGE
INTRAMUSCULAR | Status: DC | PRN
  Administered 2016-12-10: 100 mg via INTRAVENOUS

## 2016-12-10 MED ORDER — PROPOFOL 10 MG/ML IV BOLUS
INTRAVENOUS | Status: DC | PRN
  Administered 2016-12-10 (×2): 20 mg via INTRAVENOUS
  Administered 2016-12-10: 40 mg via INTRAVENOUS
  Administered 2016-12-10: 20 mg via INTRAVENOUS

## 2016-12-10 SURGICAL SUPPLY — 22 items

## 2016-12-10 NOTE — Op Note (Signed)
[facility] Patient Name: Tony Sullivan Procedure Date: 12/10/2016 MRN: [id number] Attending MD: Docia Chuck. Henrene Pastor , MD Date of Birth: 30-Jul-1961 CSN: [id number] Age: 56 Admit Type: Outpatient Procedure:                Colonoscopy, with cold snare polypectomy X2 and hot                            snare polypectomy x 1 Indications:              Screening for colorectal malignant neoplasm Providers:                Docia Chuck. Henrene Pastor, MD, Burtis Junes, RN, Elspeth Cho                            Tech., Technician, Courtney Heys. [place], CRNA Referring MD:             Claris Gladden. Anderson Medicines:                Monitored Anesthesia Care Complications:            No immediate complications. Estimated blood loss:                            None. Estimated Blood Loss:     Estimated blood loss: none. Procedure:                Pre-Anesthesia Assessment:                           - Prior to the procedure, a History and Physical                            was performed, and patient medications and                            allergies were reviewed. The patient's tolerance of                            previous anesthesia was also reviewed. The risks                            and benefits of the procedure and the sedation                            options and risks were discussed with the patient.                            All questions were answered, and informed consent                            was obtained. Prior Anticoagulants: The patient has                            taken no previous anticoagulant or antiplatelet  agents. ASA Grade Assessment: III - A patient with                            severe systemic disease. After reviewing the risks                            and benefits, the patient was deemed in                            satisfactory condition to undergo the procedure.                           After obtaining informed consent, the colonoscope                             was passed under direct vision. Throughout the                            procedure, the patient's blood pressure, pulse, and                            oxygen saturations were monitored continuously. The                            EC-[id number] ([id number]) scope was introduced through                            the anus and advanced to the the cecum, identified                            by appendiceal orifice and ileocecal valve. The                            ileocecal valve, appendiceal orifice, and rectum                            were photographed. The quality of the bowel                            preparation was excellent. The colonoscopy was                            performed without difficulty. The patient tolerated                            the procedure well. The bowel preparation used was                            SUPREP. Scope In: 10:11:57 AM Scope Out: 10:31:33 AM Scope Withdrawal Time: 0 hours 12 minutes 17 seconds  Total Procedure Duration: 0 hours 19 minutes 36 seconds  Findings:      A 10 mm polyp was found in the descending colon. The polyp was       pedunculated. The polyp was  removed with a hot snare. Resection and       retrieval were complete.      Two pedunculated polyps were found in the ascending colon. The polyps       were 5 to 7 mm in size. These polyps were removed with a cold snare.       Resection and retrieval were complete.      Multiple small-mouthed diverticula were found in the ascending colon and       left colon.      The terminal ileum was normal. The exam was otherwise without       abnormality on direct and retroflexion views. Impression:               - One 10 mm polyp in the descending colon, removed                            with a hot snare. Resected and retrieved.                           - Two 5 to 7 mm polyps in the ascending colon,                            removed with a cold snare. Resected and  retrieved.                           - Diverticulosis in the ascending colon and in the                            left colon.                           - The examination was otherwise normal on direct                            and retroflexion views. Moderate Sedation:      none Recommendation:           - Repeat colonoscopy in 3 years for surveillance.                           - Patient has a contact number available for                            emergencies. The signs and symptoms of potential                            delayed complications were discussed with the                            patient. Return to normal activities tomorrow.                            Written discharge instructions were provided to the                            patient.                           -  Resume previous diet.                           - Continue present medications.                           - Await pathology results.                           - STOPP smoking Procedure Code(s):        --- Professional ---                           [phone], Colonoscopy, flexible; with removal of                            tumor(s), polyp(s), or other lesion(s) by snare                            technique Diagnosis Code(s):        --- Professional ---                           Z12.11, Encounter for screening for malignant                            neoplasm of colon                           D12.4, Benign neoplasm of descending colon                           D12.2, Benign neoplasm of ascending colon                           K57.30, Diverticulosis of large intestine without                            perforation or abscess without bleeding CPT copyright 2016 American Medical Association. All rights reserved. The codes documented in this report are preliminary and upon coder review may  be revised to meet current compliance requirements. Docia Chuck. Henrene Pastor, MD 12/10/2016 10:38:30 AM This report has been signed  electronically. Number of Addenda: 0

## 2016-12-10 NOTE — H&P (Signed)
HISTORY OF PRESENT ILLNESS:  Tony Sullivan is a 56 y.o. male with advanced oxygen-dependent COPD who is referred today by Dr. Ouida Sills with a chief complaint of wanting colon cancer screening in the form of colonoscopy and chronic epigastric pain. He is accompanied by his girlfriend. Patient continues to smoke. First, he reports a 6 month history of intermittent epigastric fullness sensation noticed principally in the morning upon wakening. Symptoms are not affected by meals. He has had decreased appetite associated with slight weight loss. Also bloating. He denies melena or hematochezia. No change in bowel habits. No prior colon cancer screening but feels this is important as there is a history of cancer in his family.Has a history of GERD and has been placed on PPI therapy by his pulmonologist. He is also been evaluated by ENT for hoarseness as recently as last month.  REVIEW OF SYSTEMS:  All non-GI ROS negative except for shortness of breath, depression, cough, arthritis      Past Medical History:  Diagnosis Date  . Asthma   . COPD (chronic obstructive pulmonary disease) ([place]) 2004   diagnosed in 2004, no PFT's to date.  Started on home O2 12/2013, after found to be desatting at PCP's office, and referred to pulmonology.  . High blood pressure   . Seasonal allergies          Past Surgical History:  Procedure Laterality Date  . APPENDECTOMY  1980  . HIP SURGERY Right   . SHOULDER ARTHROSCOPY Right     Social History Tony Sullivan  reports that he has been smoking Cigarettes.  He has a 36.00 pack-year smoking history. He has never used smokeless tobacco. He reports that he drinks about 8.4 - 12.6 oz of alcohol per week . He reports that he does not use drugs.  family history includes Allergies in his father, mother, and son; Asthma in his mother; Cancer in his mother; Clotting disorder in his mother; Emphysema in his father and mother; Heart disease in his maternal aunt  and mother.       Allergies  Allergen Reactions  . Penicillins Anaphylaxis    Has patient had a PCN reaction causing immediate rash, facial/tongue/throat swelling, SOB or lightheadedness with hypotension: Yes Has patient had a PCN reaction causing severe rash involving mucus membranes or skin necrosis: No Has patient had a PCN reaction that required hospitalization No Has patient had a PCN reaction occurring within the last 10 years: No If all of the above answers are "NO", then may proceed with Cephalosporin use.   . Sulfa Antibiotics Swelling    Swelling of hands and throat   . Famotidine Other (See Comments)    Bloating of stomach, loss of appetite  . Levocetirizine Other (See Comments)    Muscle cramps       PHYSICAL EXAMINATION: Vital signs: BP 102/68 (BP Location: Left Arm, Patient Position: Sitting, Cuff Size: Normal)   Pulse 80   Temp 98.1 F (36.7 C) (Oral)   Ht _0  (1.803 m)   Wt 154 lb (69.9 kg)   BMI 21.48 kg/m   Constitutional: Chronically ill-appearing, nasal cannula oxygen, thin, no acute distress Psychiatric: alert and oriented x3, cooperative Eyes: extraocular movements intact, anicteric, conjunctiva pink Mouth: oral pharynx moist, no lesions Neck: supple no lymphadenopathy Cardiovascular: heart regular rate and rhythm, no murmur Lungs: clear to auscultation bilaterally Abdomen: soft, nontender, nondistended, no obvious ascites, no peritoneal signs, normal bowel sounds, no organomegaly Rectal: Deferred Extremities: no clubbing cyanosis  or lower extremity edema bilaterally Skin: no lesions on visible extremities Neuro: No focal deficits. No asterixis.   ASSESSMENT:  #1. Colon cancer screening. I discussed with him in great detail different options but focused on FIT stool testing, Cologuard, and optical colonoscopy. I told him that I favored one of the stool strategies given his lung disease and understanding that a positive test would  elicit the recommendation for colonoscopy. Despite this, the patient wanted to proceed with optical colonoscopy directly. He understands that this would have to be done at [place] he is high-risk.The nature of the procedure, as well as the risks, benefits, and alternatives were carefully and thoroughly reviewed with the patient. Ample time for discussion and questions allowed. The patient understood, was satisfied, and agreed to proceed.  Most recent H&P as outlined above. Patient reports no interval clinical problems. Tells me that he tolerated his prep well. Is here with her stepfather. For screening colonoscopy with MAC.  Docia Chuck. Geri Seminole., M.D. [facility] Division of Gastroenterology

## 2016-12-10 NOTE — Anesthesia Preprocedure Evaluation (Addendum)
Anesthesia Evaluation  Patient identified by MRN, date of birth, ID band Patient awake    Reviewed: Allergy & Precautions, NPO status , Patient's Chart, lab work & pertinent test results  Airway Mallampati: II  TM Distance: >3 FB Neck ROM: Full    Dental  (+) Dental Advisory Given   Pulmonary asthma , COPD,  oxygen dependent, Current Smoker,    breath sounds clear to auscultation       Cardiovascular hypertension, Pt. on medications  Rhythm:Regular Rate:Normal     Neuro/Psych negative neurological ROS     GI/Hepatic Neg liver ROS, GERD  ,  Endo/Other  negative endocrine ROS  Renal/GU negative Renal ROS     Musculoskeletal negative musculoskeletal ROS (+)   Abdominal   Peds  Hematology negative hematology ROS (+)   Anesthesia Other Findings   Reproductive/Obstetrics                            Anesthesia Physical Anesthesia Plan  ASA: III  Anesthesia Plan: MAC   Post-op Pain Management:    Induction: Intravenous  Airway Management Planned: Natural Airway and Simple Face Mask  Additional Equipment:   Intra-op Plan:   Post-operative Plan:   Informed Consent: I have reviewed the patients History and Physical, chart, labs and discussed the procedure including the risks, benefits and alternatives for the proposed anesthesia with the patient or authorized representative who has indicated his/her understanding and acceptance.     Plan Discussed with:   Anesthesia Plan Comments:         Anesthesia Quick Evaluation

## 2016-12-10 NOTE — Anesthesia Postprocedure Evaluation (Signed)
Anesthesia Post Note  Patient: Tony Sullivan  Procedure(s) Performed: Procedure(s) (LRB): COLONOSCOPY WITH PROPOFOL (N/A)  Patient location during evaluation: PACU Anesthesia Type: MAC Level of consciousness: awake and alert Pain management: pain level controlled Vital Signs Assessment: post-procedure vital signs reviewed and stable Respiratory status: spontaneous breathing, nonlabored ventilation, respiratory function stable and patient connected to nasal cannula oxygen Cardiovascular status: stable and blood pressure returned to baseline Anesthetic complications: no       Last Vitals:  Vitals:   12/10/16 0904 12/10/16 0905  BP: (!) 122/94   Pulse: 67   Resp: 10   Temp:  36.5 C    Last Pain:  Vitals:   12/10/16 0905  TempSrc: Leonard Schwartz

## 2016-12-10 NOTE — Transfer of Care (Signed)
Immediate Anesthesia Transfer of Care Note  Patient: Tony Sullivan  Procedure(s) Performed: Procedure(s): COLONOSCOPY WITH PROPOFOL (N/A)  Patient Location: PACU and Endoscopy Unit  Anesthesia Type:MAC  Level of Consciousness: awake, alert , oriented and patient cooperative  Airway & Oxygen Therapy: Patient Spontanous Breathing and Patient connected to face mask oxygen  Post-op Assessment: Report given to RN and Post -op Vital signs reviewed and stable  Post vital signs: Reviewed and stable  Last Vitals:  Vitals:   12/10/16 0904 12/10/16 0905  BP: (!) 122/94   Pulse: 67   Resp: 10   Temp:  36.5 C    Last Pain:  Vitals:   12/10/16 0905  TempSrc: Oral         Complications: No apparent anesthesia complications

## 2016-12-12 ENCOUNTER — Encounter: Payer: Self-pay | Admitting: Internal Medicine

## 2016-12-13 ENCOUNTER — Encounter (HOSPITAL_COMMUNITY): Payer: Self-pay | Admitting: Internal Medicine

## 2016-12-31 ENCOUNTER — Other Ambulatory Visit: Payer: Self-pay | Admitting: Adult Health

## 2017-02-14 ENCOUNTER — Telehealth: Payer: Self-pay | Admitting: Internal Medicine

## 2017-02-14 NOTE — Telephone Encounter (Signed)
Patient states would like to come to officer sooner than 5/8. Was confused about original appt time. Patient contact # [phone]..refer this message to original message submitted earlier today....ert

## 2017-02-14 NOTE — Telephone Encounter (Signed)
Pt appt rescheduled to 02/18/17 with MW per pt request.  Pt is going to call back to confirm that transportation is set up or if he will need to move the appointmentt to another date (May 1st @ 415). Will await call back for confirmation.

## 2017-02-14 NOTE — Telephone Encounter (Signed)
Called and spoke to pt. Pt c/o increase in SOB x few days. Pt states he was at his PCP and they stated his spO2 was at 89%, pt states after rest it recovered to 93%. Pt states he did have mid-sternal CP a few days ago but did not seek care, it has resolved. Pt states he was calling just to give MW an Juluis Rainier and if his CP returns or his SOB increase then he will go to ED. Pt did not want to come in office sooner than his 03/04/2017 appt.   Will forward to MW as Juluis Rainier.

## 2017-02-18 ENCOUNTER — Ambulatory Visit: Payer: Medicaid Other | Admitting: Internal Medicine

## 2017-02-18 NOTE — Telephone Encounter (Signed)
appt has been changed to 5/1

## 2017-02-25 ENCOUNTER — Encounter: Payer: Self-pay | Admitting: Internal Medicine

## 2017-02-25 ENCOUNTER — Ambulatory Visit (INDEPENDENT_AMBULATORY_CARE_PROVIDER_SITE_OTHER): Payer: Medicaid Other | Admitting: Internal Medicine

## 2017-02-25 ENCOUNTER — Ambulatory Visit (INDEPENDENT_AMBULATORY_CARE_PROVIDER_SITE_OTHER)
Admission: RE | Admit: 2017-02-25 | Discharge: 2017-02-25 | Disposition: A | Discharge status: Home / Self Care | Payer: Medicaid Other | Source: Ambulatory Visit | Attending: Internal Medicine | Admitting: Internal Medicine

## 2017-02-25 VITALS — BP 130/84 | HR 101 | Ht 71.0 in | Wt 153.0 lb

## 2017-02-25 DIAGNOSIS — J9611 Chronic respiratory failure with hypoxia: Secondary | ICD-10-CM

## 2017-02-25 DIAGNOSIS — J449 Chronic obstructive pulmonary disease, unspecified: Secondary | ICD-10-CM | POA: Diagnosis not present

## 2017-02-25 IMAGING — DX DG CHEST 2V
3 series · 3 of 3 positions shown · non-contrast
Comparison: [DATE]

CLINICAL DATA: Routine x-ray. History is COPD and asthma. No
current complaints.

EXAM:
CHEST  2 VIEW

[chest pa (1 of 2)]
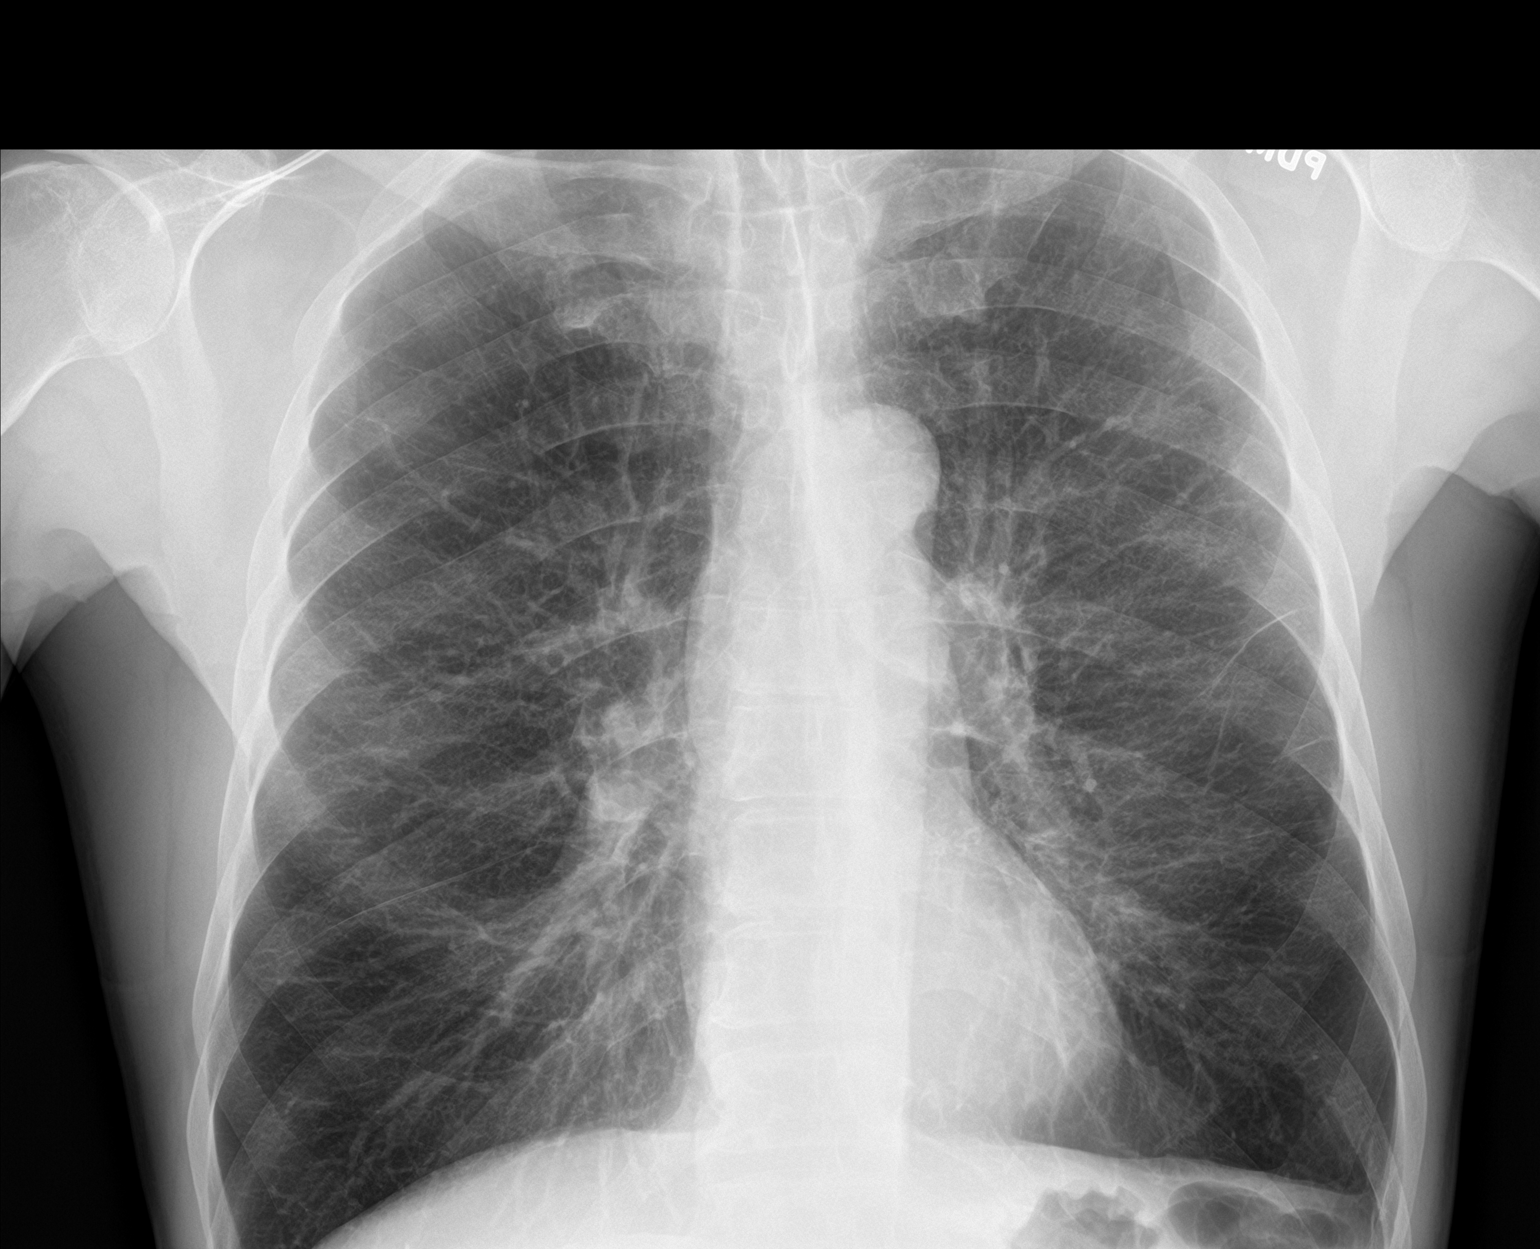

[chest lat]
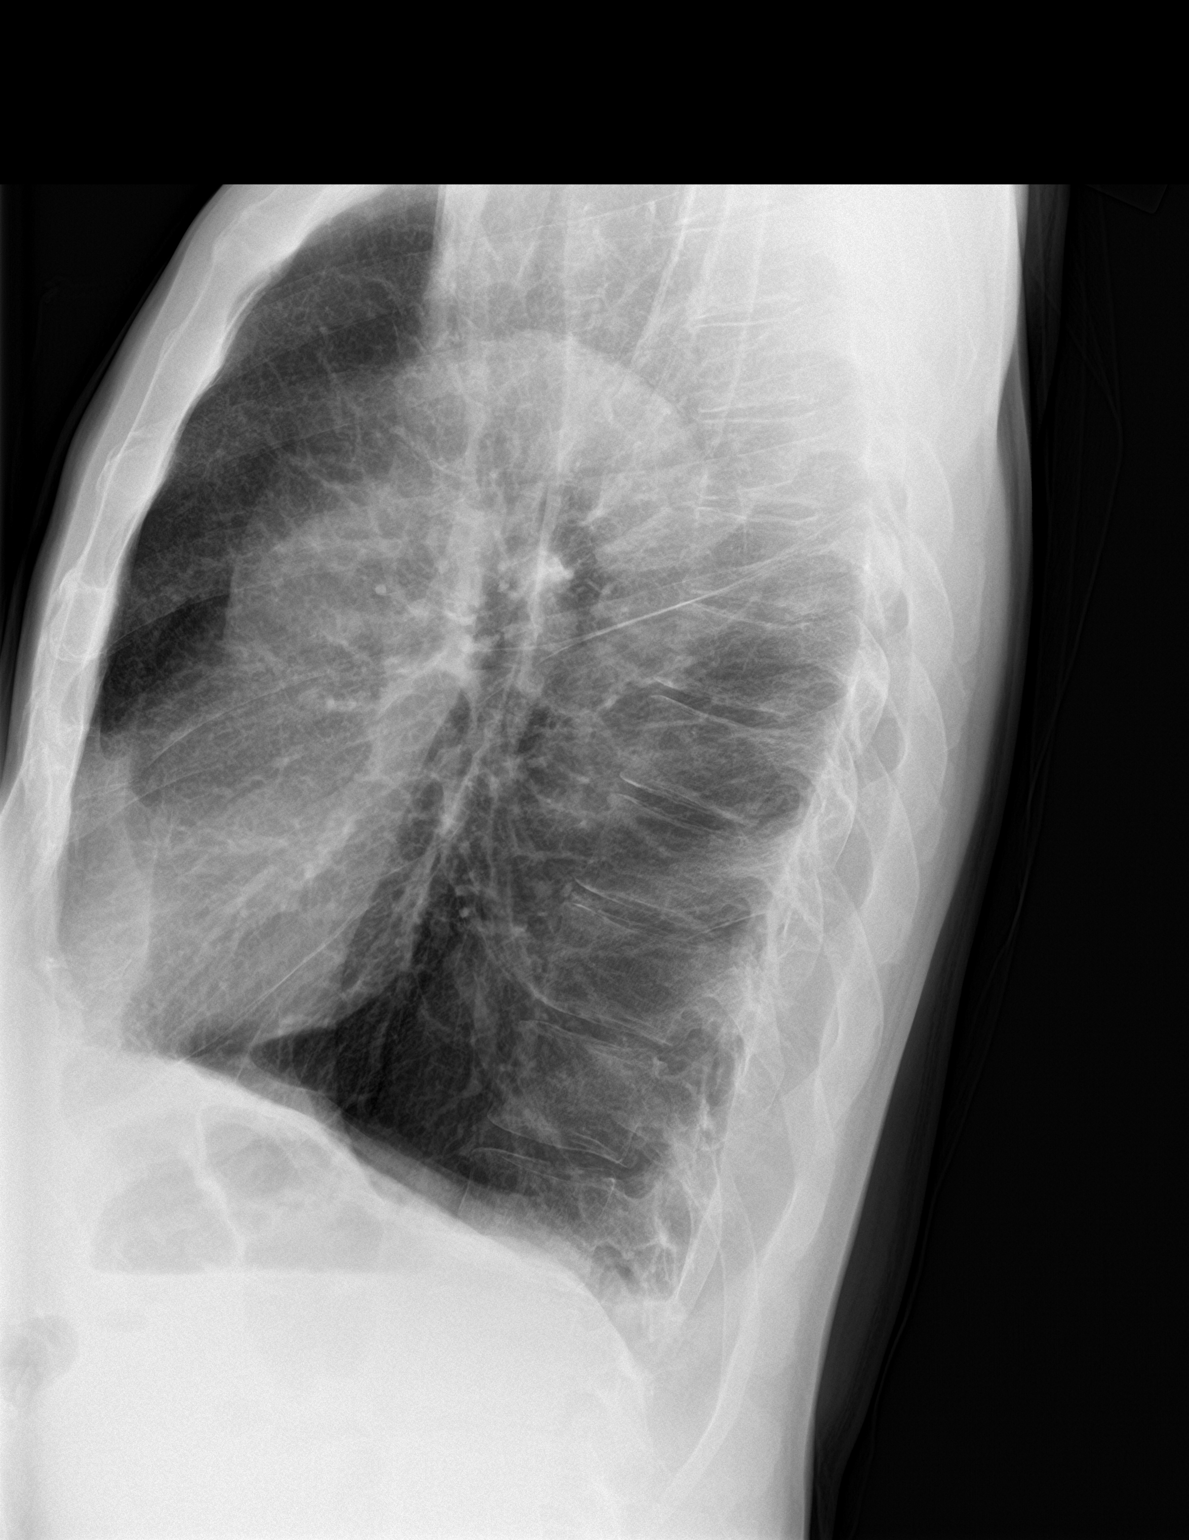

[chest pa (2 of 2)]
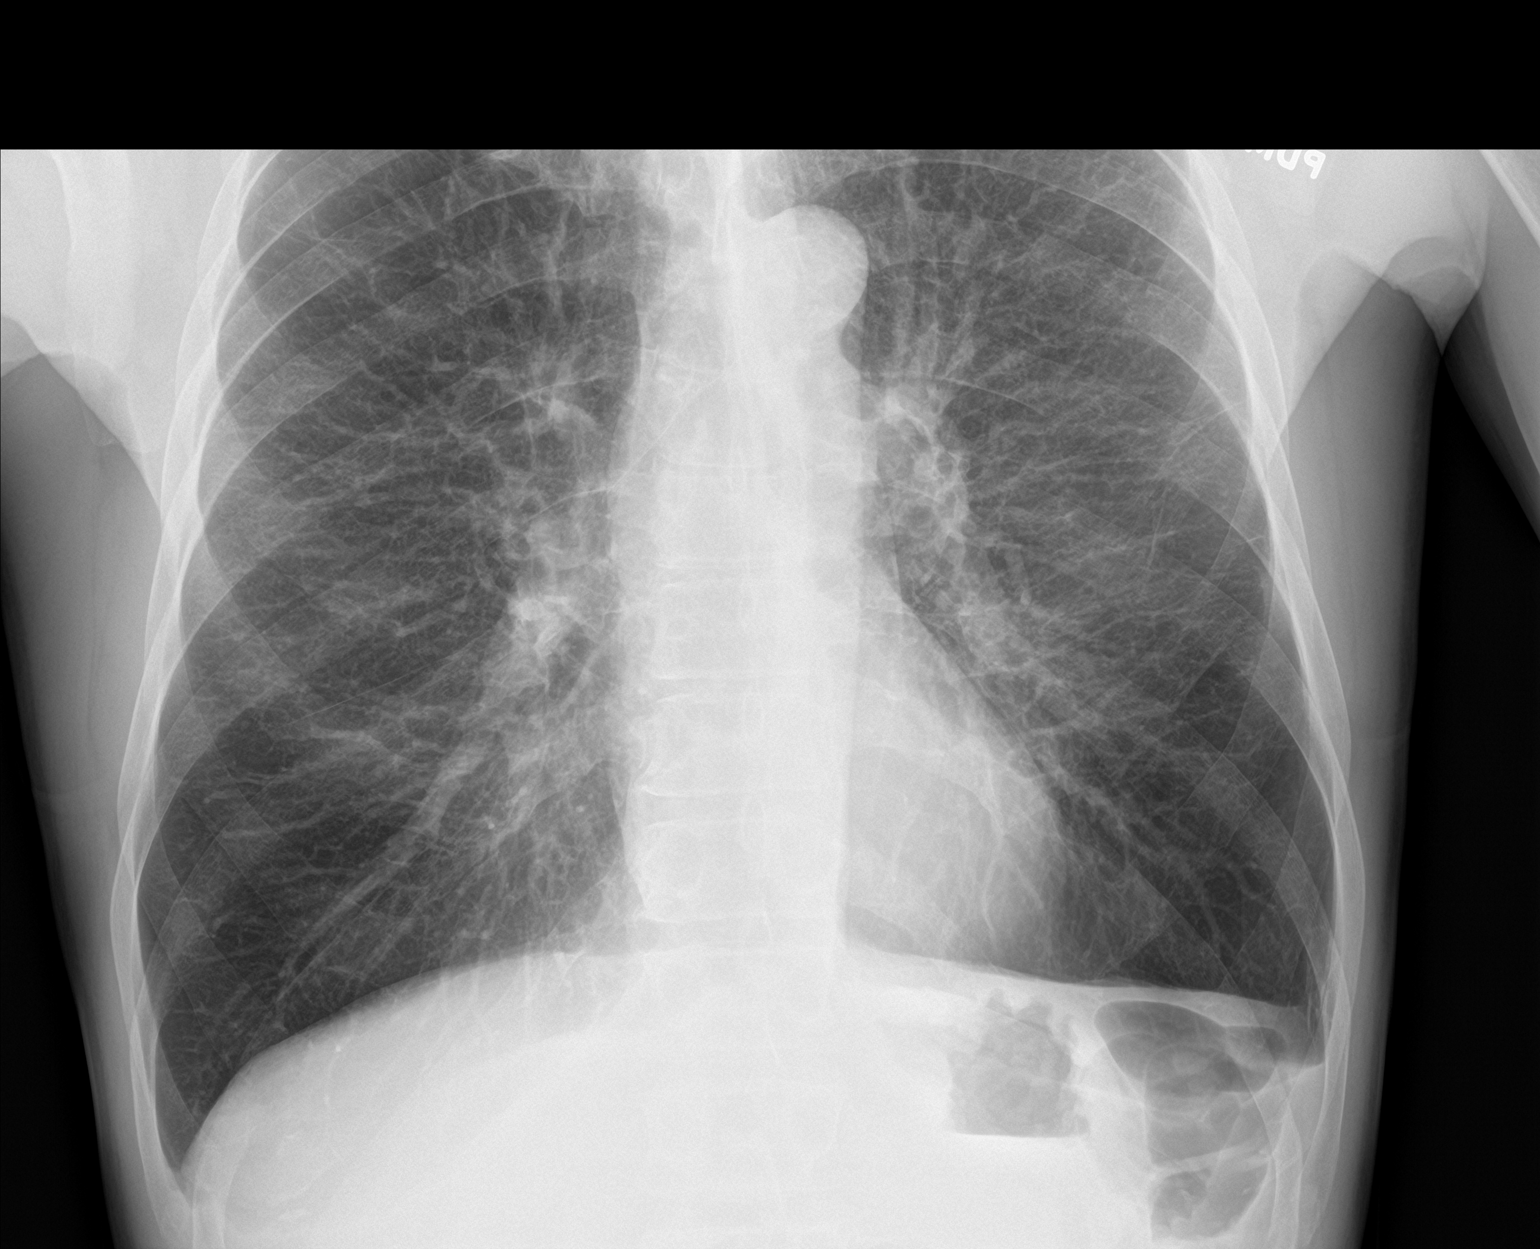

[3 of 3 positions shown; findings below may reference images not displayed]

FINDINGS: Chronic hyperinflation of lungs. The heart, hila, and mediastinum
are normal. No pneumothorax. No nodule or mass. No focal infiltrate.
Chronic bronchitic changes in the lungs.
IMPRESSION: Hyperinflation of the lungs and chronic bronchitic changes with no
acute abnormality.

## 2017-02-25 MED ORDER — PREDNISONE 10 MG PO TABS: ORAL_TABLET | ORAL | 0 refills | Status: DC

## 2017-02-25 NOTE — Progress Notes (Signed)
Subjective:    Patient ID: Tony Sullivan, male    DOB: 1961/01/07  MRN: [id number]    Brief patient profile:  55  yobm quit smoking 03/17/16 with breathing difficulties since 2004 dx as copd on advair 250 /50 but getting worse since 2014 and placed on 02 for the first time in March 2015 and referred 01/07/2014 to pulmonary clinic by Dr Kennon Holter with GOLD II copd criteria established 02/14/14    History of Present Illness  01/07/2014 1st [facility] office visit/ Tony Sullivan  Chief Complaint  Patient presents with  . Advice Only    Referred for COPD, SOB.  Pt c/o SOB, low 02, prod cough with white mucous.  Pt forgot med list, unsure of what all he is taking  still can do a [employer] but uses [facility] parking, assoc with congested coughing esp in am worse in winter. rec Stop advair   Plan A =  Automatic =  symbicort 160 Take 2 puffs first thing in am and then another 2 puffs about 12 hours later. spiriva in am only  Plan B = Backup- Proventil use it only if you can't your breath - ok to use up to every 4 hours if needed Plan C = Nebulizer up to every 4 hours if needed only if you try plan B first and it doesn't work                  08/20/2016 NP  Follow up : COPD , O2 depend RF  Pt returns for a 2 week follow up and med review  We reviewed all his meds and organized them into a med calendar with pt educaiton  Appears to be taking correctly.  He had a copd flare last ov , given prednisone taper. Says he is feeling better w/ decreased cough and dyspnea. Trying to walk daily .  Has not smoked any . Smoking cessation encouraged.  Not interested in pulmon rehab , too expensive.  Wants referral to ENT for chronic hoarseness. Says he feels he has been hoarse for several months and will not go away.  Was suppose to see GI, wants [place] to help reschedule his visit . He continues to have some swallow issues at times. Feels full all the time, gets hungry then after couple of bites feels too  full.  rec Continue on Symbicort 2 puffs Twice daily  , rinse after use.  Continue on Tudorza 1 puff Twice daily  , rinse after use.  Great job on not smoking .  Refer to ENT for hoarseness. .  Refer to GI  -to reschedule.  Follow med calendar closely and bring to each visit.    12/05/2016  f/u ov/[name] re:   GOLD II copd / 02 2lpm hs and prn, symb 160 / tudorza - no med calendar Chief Complaint  Patient presents with  . Follow-up    Breathing is unchanged. He states using his albuterol inhaler 2 x daily on average and neb 2-3 x per wk on average. He states "I cough all the time"- prod with minimal clear sputum.   moslty uses hfa saba when overdoes it, does not have prn pred  Though that was the prev intent  Doe = MMRC2 = can't walk a nl pace on a flat grade s sob but does fine slow and flat eg shopping (on 2lpm )  rec When having to use more of  Plan C than baseline rec Prednisone 10 mg take  4 each am x 2 days,   2 each am x 2 days,  1 each am x 2 days and stop  See calendar for specific medication instructions and bring it back for each and every office visit for every healthcare provider you see.  Without it,  you may not receive the best quality medical care that we feel you deserve.   02/25/2017  f/u ov/Tony Sullivan re:   GOLD II/ quit smoking as of 11/2016 on sym 160 2bid/ tudorza and 02 2lpm  Chief Complaint  Patient presents with  . Follow-up    Breathing has been worse. He lost his albuterol inhaler. He has been using albuterol neb every am.  He feels that his breathing is worse first thing in the am. He has to sleep propped up due to SOB when he lies down flat.   confused with instructions/ still dep on neb and off prednisone / no med calendar  Does respond to prednisone each time he takes a course with less sob/less saba need    No obvious day to day or daytime variability or assoc excess/ purulent sputum or mucus plugs or hemoptysis or cp or chest tightness, subjective wheeze or overt  sinus or hb symptoms. No unusual exp hx or h/o childhood pna/ asthma or knowledge of premature birth.  Sleeping ok without nocturnal  or early am exacerbation  of respiratory  c/o's or need for noct saba. Also denies any obvious fluctuation of symptoms with weather or environmental changes or other aggravating or alleviating factors except as outlined above   Current Medications, Allergies, Complete Past Medical History, Past Surgical History, Family History, and Social History were reviewed in [employer] record.  ROS  The following are not active complaints unless bolded sore throat, dysphagia, dental problems, itching, sneezing,  nasal congestion or excess/ purulent secretions, ear ache,   fever, chills, sweats, unintended wt loss, classically pleuritic or exertional cp,  orthopnea pnd or leg swelling, presyncope, palpitations, abdominal pain, anorexia, nausea, vomiting, diarrhea  or change in bowel or bladder habits, change in stools or urine, dysuria,hematuria,  rash, arthralgias, visual complaints, headache, numbness, weakness or ataxia or problems with walking or coordination,  change in mood/affect or memory.                 Objective:   Physical Exam  Moderately hoarse amb bm nad   06/15/2014  164  >  09/15/2014 158 >   02/20/2015 >156 03/22/2015 >  07/31/2015   150 > 12/05/2015  161   > 04/01/2016    151 > 07/29/2016  152 >   02/25/2017  153   Vital signs reviewed   - Note on arrival 02 sats  88% RA and  92% on 2lpm NP    HEENT: nl dentition, turbinates, and oropharynx. Nl external ear canals without cough reflex   NECK :  without JVD/Nodes/TM/ nl carotid upstrokes bilaterally   LUNGS: no acc muscle use,  Barrel chest, distant bs s wheeze/ moderate prolonged T exp    CV:  RRR  no s3 or murmur or increase in P2, nad no edema   ABD:  soft and nontender with pos hoover's mid insp in the supine position. No bruits or organomegaly appreciated, bowel sounds  nl  MS:  Nl gait/ ext warm without deformities, calf tenderness, cyanosis or clubbing No obvious joint restrictions   SKIN: warm and dry without lesions    NEURO:  alert, approp, nl sensorium with  no motor or cerebellar deficits apparent.            CXR PA and Lateral:   02/25/2017 :    I personally reviewed images and agree with radiology impression as follows:    Hyperinflation of the lungs and chronic bronchitic changes with no acute abnormality.

## 2017-02-25 NOTE — Patient Instructions (Addendum)
Prednisone 10 mg x 2 daily until better nad not having to use your nebulizer each am then reduce to 10 mg daily   Please remember to go to the  x-ray department downstairs in the basement  for your tests - we will call you with the results when they are available.     See Tammy NP w/in 2 weeks with all your medications, even over the counter meds, separated in two separate bags, the ones you take no matter what vs the ones you stop once you feel better and take only as needed when you feel you need them.   Tammy  will generate for you a new user friendly medication calendar that will put [place] all on the same page re: your medication use.     Without this process, it simply isn't possible to assure that we are providing  your outpatient care  with  the attention to detail we feel you deserve.   If we cannot assure that you're getting that kind of care,  then we cannot manage your problem effectively from this clinic.  Once you have seen Tammy and we are sure that we're all on the same page with your medication use she will arrange follow up with me.

## 2017-02-26 NOTE — Assessment & Plan Note (Addendum)
-   02/14/2014   try symbicort 160 2bid  - PFT's  02/14/2014   FEV1 1.82 (53%) and ratio 47 no better p alb (p spiriva and symbicort 160)  and DLCO 32 with 37%  07/2014 >Spiriva stopped  09/15/2014 Med calendar> not using 02/20/2015   - 02/20/2015 p extensive coaching HFA effectiveness =    90%  - Declined rehab 07/31/15  - Quit smoking 11/2016  - flutter valve 04/01/2016  -med calendar 08/20/16  - added prn pred to action plan 12/05/2016   - 02/25/2017  After extensive coaching HFA effectiveness =    90%  - changed to maint pred 02/25/2017 with floor of 10 mg daily >>>    Although he reports quit smoking and using the med calendar as instructed, not able to verify either of these issues today but the good news is that he does improve quite a bit with use of prednisone  The goal with a chronic steroid dependent illness is always arriving at the lowest effective dose that controls the disease/symptoms and not accepting a set "formula" which is based on statistics or guidelines that don't always take into account patient  variability or the natural hx of the dz in every individual patient, which may well vary over time.  For now therefore I recommend the patient maintain  A ceiling of 20 mg per day and a floor of 10 mg per day until returns    I had an extended discussion with the patient reviewing all relevant studies completed to date and  lasting 15 to 20 minutes of a 25 minute visit    Each maintenance medication was reviewed in detail including most importantly the difference between maintenance and prns and under what circumstances the prns are to be triggered using an action plan format that is not reflected in the computer generated alphabetically organized AVS but trather by a customized med calendar that reflects the AVS meds with confirmed 100% correlation.   In addition, Please see AVS for unique instructions that I personally wrote and verbalized to the the pt in detail and then reviewed with pt   by my nurse highlighting any  changes in therapy recommended at today's visit to their plan of care.

## 2017-02-26 NOTE — Assessment & Plan Note (Signed)
-   started on 27 December 2013 by [facility] clinic - 02/14/2014  Walked RA x 3/4  laps @ 185 ft each stopped due to  desat 85% and corrected on 3lpm to x 2 laps  - 06/15/2014  Walked 2.5lpm  2 laps  @ 185 ft each stopped due to  Sob adequate sats  - 02/20/2015  Walked RA x 3 laps @ 185 ft each stopped due to  Sob/desat p 2 laps, nl pace, eliminated on his 02 at 3lpm  - 12/05/2015   [facility] RA  2 laps @ 185 ft each stopped due to  desat to 88% and corrected on 2lpm   rec as of 02/25/2017 =  2lpm  247

## 2017-02-26 NOTE — Progress Notes (Signed)
Spoke with pt and notified of results per Dr. Wert. Pt verbalized understanding and denied any questions. 

## 2017-02-28 ENCOUNTER — Other Ambulatory Visit: Payer: Self-pay | Admitting: Internal Medicine

## 2017-03-04 ENCOUNTER — Ambulatory Visit: Payer: Medicaid Other | Admitting: Adult Health

## 2017-03-10 ENCOUNTER — Encounter: Payer: Medicaid Other | Admitting: Adult Health

## 2017-03-18 ENCOUNTER — Other Ambulatory Visit: Payer: Self-pay | Admitting: Internal Medicine

## 2017-03-26 ENCOUNTER — Telehealth: Payer: Self-pay | Admitting: Internal Medicine

## 2017-03-26 ENCOUNTER — Other Ambulatory Visit: Payer: Self-pay | Admitting: Internal Medicine

## 2017-03-26 MED ORDER — PREDNISONE 10 MG PO TABS: ORAL_TABLET | ORAL | 0 refills | Status: DC

## 2017-03-26 NOTE — Telephone Encounter (Signed)
Has been out of the [place] for the last few days due to his insurance not willing to pay for [place].   [facility] to check on Tudorza. It needs a PA. 03/21/2018   Called [employer] for PA on Tudorza, it has been approved until 03/21/2018.   Will send in Prednisone for patient.   Left message on patient's VM to make him aware.

## 2017-03-27 ENCOUNTER — Ambulatory Visit (INDEPENDENT_AMBULATORY_CARE_PROVIDER_SITE_OTHER): Payer: Medicaid Other | Admitting: Adult Health

## 2017-03-27 ENCOUNTER — Encounter: Payer: Self-pay | Admitting: Adult Health

## 2017-03-27 DIAGNOSIS — J449 Chronic obstructive pulmonary disease, unspecified: Secondary | ICD-10-CM

## 2017-03-27 DIAGNOSIS — J9611 Chronic respiratory failure with hypoxia: Secondary | ICD-10-CM | POA: Diagnosis not present

## 2017-03-27 MED ORDER — PREDNISONE 10 MG PO TABS: ORAL_TABLET | ORAL | 2 refills | Status: DC

## 2017-03-27 NOTE — Addendum Note (Signed)
Addended by: Parke Poisson E on: 03/27/2017 05:26 PM   Modules accepted: Orders

## 2017-03-27 NOTE — Assessment & Plan Note (Signed)
Cont on o2 .  

## 2017-03-27 NOTE — Assessment & Plan Note (Signed)
Recent flare now resolving improved with increased steroid dose.  Patient's medications were reviewed today and patient education was given. Computerized medication calendar was adjusted/completed   Plan  Patient Instructions  Continue on Symbicort 2 puffs Twice daily  , rinse after use.  Continue on Tudorza 1 puff Twice daily  , rinse after use.  Follow med calendar closely and bring to each visit.  Follow up Dr. [name]  In  6-8 weeks and As needed   Please contact office for sooner follow up if symptoms do not improve or worsen or seek emergency care

## 2017-03-27 NOTE — Progress Notes (Signed)
Chart and office note reviewed in detail  > agree with a/p as outlined    

## 2017-03-27 NOTE — Patient Instructions (Signed)
Continue on Symbicort 2 puffs Twice daily  , rinse after use.  Continue on Tudorza 1 puff Twice daily  , rinse after use.  Follow med calendar closely and bring to each visit.  Follow up Dr. [name]  In  6-8 weeks and As needed   Please contact office for sooner follow up if symptoms do not improve or worsen or seek emergency care

## 2017-03-27 NOTE — Progress Notes (Signed)
@Patient  ID: Tony Sullivan, male    DOB: February 20, 1961, 56 y.o.   MRN: [id number]  Chief Complaint  Patient presents with  . Follow-up    COPD     Referring provider: [name], FNP  HPI: 56 year old male or smoker followed for COPD Gold 2, and oxygen dependent respiratory failure  TEST  - PFT's  02/14/2014   FEV1 1.82 (53%) and ratio 47 no better p alb (p spiriva and symbicort 160)  and DLCO 32 with 37%   03/27/2017 Follow up : COPD , O2 RF  Patient presents for a two-week follow-up. Patient was seen last visit with COPD flare , chronic prednisone was increased 20mg  daily . He says he is starting to feel better. Feels that his breathing is returning back to his baseline. We discussed his prednisone planned to be on baseline of 10 mg and increased to 20 mg of symptoms with shortness of breath and cough improved increase.  We reviewed all patient's medications organize them into a medication count with patient education Patient appears be take his vacation correctly  Remains on Oxygen 2l/m w/ act and At bedtime  .  He does not use it at rest.    Allergies  Allergen Reactions  . Penicillins Anaphylaxis    Has patient had a PCN reaction causing immediate rash, facial/tongue/throat swelling, SOB or lightheadedness with hypotension: Yes Has patient had a PCN reaction causing severe rash involving mucus membranes or skin necrosis: No Has patient had a PCN reaction that required hospitalization No Has patient had a PCN reaction occurring within the last 10 years: No If all of the above answers are "NO", then may proceed with Cephalosporin use.   . Sulfa Antibiotics Swelling    Swelling of hands and throat   . Famotidine Other (See Comments)    Bloating of stomach, loss of appetite  . Levocetirizine Other (See Comments)    Muscle cramps    Immunization History  Administered Date(s) Administered  . Influenza Split 08/09/2013, 08/03/2014  . Influenza,inj,Quad PF,36+ Mos  07/31/2015, 07/29/2016  . Pneumococcal Conjugate-13 07/31/2015  . Pneumococcal Polysaccharide-23 08/03/2014    Past Medical History:  Diagnosis Date  . Asthma   . COPD (chronic obstructive pulmonary disease) ([place]) 2004   diagnosed in 2004, no PFT's to date.  Started on home O2 12/2013, after found to be desatting at PCP's office, and referred to pulmonology.  . High blood pressure   . Seasonal allergies     Tobacco History: History  Smoking Status  . Former Smoker  . Packs/day: 1.00  . Years: 36.00  . Types: Cigarettes  . Quit date: 11/28/2016  Smokeless Tobacco  . Never Used   Counseling given: Not Answered   Outpatient Encounter Prescriptions as of 03/27/2017  Medication Sig  . Aclidinium Bromide (TUDORZA PRESSAIR) 400 MCG/ACT AEPB Inhale 1 puff into the lungs 2 (two) times daily.  Marland Kitchen albuterol (PROAIR HFA) 108 (90 Base) MCG/ACT inhaler 2 puffs up to every 4 hours if needed for short of breath  . albuterol (PROVENTIL) (2.5 MG/3ML) 0.083% nebulizer solution Take 2.5 mg by nebulization every 4 (four) hours as needed for wheezing or shortness of breath.   Marland Kitchen amLODipine (NORVASC) 10 MG tablet Take 10 mg by mouth every morning.   . benztropine (COGENTIN) 0.5 MG tablet Take 0.5 mg by mouth at bedtime.   . budesonide-formoterol (SYMBICORT) 160-4.5 MCG/ACT inhaler INHALE TWO PUFFS INTO THE LUNGS TWO (TWO) TIMES DAILY.  . cetirizine (ZYRTEC)  10 MG tablet Take 10 mg by mouth daily.  Marland Kitchen dextromethorphan-guaiFENesin (MUCINEX DM) 30-600 MG 12hr tablet Take 1 tablet by mouth 2 (two) times daily as needed for cough.  . famotidine (PEPCID) 20 MG tablet TAKE 1 TABLET BY MOUTH AFTER BREAKFAST AND SUPPER  . fluticasone (FLONASE) 50 MCG/ACT nasal spray PLACE TWO SPRAYS INTO BOTH NOSTRILS THREE TIMES DAILY AS NEEDED FOR ALLERGIES.  Marland Kitchen loxapine (LOXITANE) 10 MG capsule Take 10 mg by mouth at bedtime.   . mirtazapine (REMERON) 30 MG tablet Take 30 mg by mouth at bedtime.  . Multiple Vitamin  (MULTIVITAMIN) capsule Take 1 capsule by mouth daily.  Marland Kitchen omeprazole (PRILOSEC) 20 MG capsule TAKE 1 CAPSULE (20 MG TOTAL) BY MOUTH DAILY.  Marland Kitchen OXYGEN Inhale 2-3 L into the lungs as directed. 2-3 liters with sleep and exertion as needed for low oxygen  . predniSONE (DELTASONE) 10 MG tablet Take  2 daily until better then 1 daily  . Respiratory Therapy Supplies (FLUTTER) DEVI Use as directed  . risperiDONE (RISPERDAL) 2 MG tablet Take 2 mg by mouth at bedtime.  . traMADol (ULTRAM) 50 MG tablet Take 100 mg by mouth every 6 (six) hours as needed for moderate pain.  . valsartan (DIOVAN) 160 MG tablet Take 1 tablet (160 mg total) by mouth daily.   No facility-administered encounter medications on file as of 03/27/2017.      Review of Systems  Constitutional:   No  weight loss, night sweats,  Fevers, chills, + fatigue, or  lassitude.  HEENT:   No headaches,  Difficulty swallowing,  Tooth/dental problems, or  Sore throat,                No sneezing, itching, ear ache, nasal congestion, post nasal drip,   CV:  No chest pain,  Orthopnea, PND, swelling in lower extremities, anasarca, dizziness, palpitations, syncope.   GI  No heartburn, indigestion, abdominal pain, nausea, vomiting, diarrhea, change in bowel habits, loss of appetite, bloody stools.   Resp:    No chest wall deformity  Skin: no rash or lesions.  GU: no dysuria, change in color of urine, no urgency or frequency.  No flank pain, no hematuria   MS:  No joint pain or swelling.  No decreased range of motion.  No back pain.    Physical Exam  BP 116/74 (BP Location: Left Arm, Cuff Size: Normal)   Pulse 83   Ht 5\' 11"  (1.803 m)   Wt 152 lb 12.8 oz (69.3 kg)   SpO2 92%   BMI 21.31 kg/m   GEN: A/Ox3; pleasant , NAD elderly    HEENT:  [place]/AT,  EACs-clear, TMs-wnl, NOSE-clear, THROAT-clear, no lesions, no postnasal drip or exudate noted.   NECK:  Supple w/ fair ROM; no JVD; normal carotid impulses w/o bruits; no thyromegaly or  nodules palpated; no lymphadenopathy.    RESP  Decreased BS in bases ,  no accessory muscle use, no dullness to percussion  CARD:  RRR, no m/r/g, no peripheral edema, pulses intact, no cyanosis or clubbing.  GI:   Soft & nt; nml bowel sounds; no organomegaly or masses detected.   Musco: Warm bil, no deformities or joint swelling noted.   Neuro: alert, no focal deficits noted.    Skin: Warm, no lesions or rashes    Lab Results:  CBC  BNP  Imaging: Dg Chest 2 View  Result Date: 02/25/2017 CLINICAL DATA:  Routine x-ray. History is COPD and asthma. No current complaints. EXAM:  CHEST  2 VIEW COMPARISON:  July 17, 2016 FINDINGS: Chronic hyperinflation of lungs. The heart, hila, and mediastinum are normal. No pneumothorax. No nodule or mass. No focal infiltrate. Chronic bronchitic changes in the lungs. IMPRESSION: Hyperinflation of the lungs and chronic bronchitic changes with no acute abnormality. Electronically Signed   By: Dorise Bullion III M.D   On: 02/25/2017 18:43     Assessment & Plan:   COPD  GOLD II Recent flare now resolving improved with increased steroid dose.  Patient's medications were reviewed today and patient education was given. Computerized medication calendar was adjusted/completed   Plan  Patient Instructions  Continue on Symbicort 2 puffs Twice daily  , rinse after use.  Continue on Tudorza 1 puff Twice daily  , rinse after use.  Follow med calendar closely and bring to each visit.  Follow up Dr. [name]  In  6-8 weeks and As needed   Please contact office for sooner follow up if symptoms do not improve or worsen or seek emergency care      Chronic respiratory failure with hypoxia ([place]) Cont on o2 .      Rexene Edison, NP 03/27/2017

## 2017-03-29 ENCOUNTER — Inpatient Hospital Stay (HOSPITAL_COMMUNITY)
Admission: EM | Admit: 2017-03-29 | Discharge: 2017-03-30 | DRG: 190 | Disposition: A | Discharge status: Home / Self Care | Payer: Medicaid Other | Attending: Internal Medicine | Admitting: Internal Medicine

## 2017-03-29 ENCOUNTER — Emergency Department (HOSPITAL_COMMUNITY): Payer: Medicaid Other

## 2017-03-29 ENCOUNTER — Encounter (HOSPITAL_COMMUNITY): Payer: Self-pay

## 2017-03-29 DIAGNOSIS — Z882 Allergy status to sulfonamides status: Secondary | ICD-10-CM | POA: Diagnosis not present

## 2017-03-29 DIAGNOSIS — Z7951 Long term (current) use of inhaled steroids: Secondary | ICD-10-CM | POA: Diagnosis not present

## 2017-03-29 DIAGNOSIS — R251 Tremor, unspecified: Secondary | ICD-10-CM | POA: Diagnosis present

## 2017-03-29 DIAGNOSIS — Z87891 Personal history of nicotine dependence: Secondary | ICD-10-CM | POA: Diagnosis not present

## 2017-03-29 DIAGNOSIS — Z9981 Dependence on supplemental oxygen: Secondary | ICD-10-CM

## 2017-03-29 DIAGNOSIS — Z880 Allergy status to penicillin: Secondary | ICD-10-CM

## 2017-03-29 DIAGNOSIS — J9622 Acute and chronic respiratory failure with hypercapnia: Secondary | ICD-10-CM | POA: Diagnosis present

## 2017-03-29 DIAGNOSIS — J9621 Acute and chronic respiratory failure with hypoxia: Secondary | ICD-10-CM | POA: Diagnosis present

## 2017-03-29 DIAGNOSIS — F17210 Nicotine dependence, cigarettes, uncomplicated: Secondary | ICD-10-CM | POA: Diagnosis present

## 2017-03-29 DIAGNOSIS — Z720 Tobacco use: Secondary | ICD-10-CM | POA: Diagnosis not present

## 2017-03-29 DIAGNOSIS — J9602 Acute respiratory failure with hypercapnia: Secondary | ICD-10-CM | POA: Diagnosis not present

## 2017-03-29 DIAGNOSIS — J9601 Acute respiratory failure with hypoxia: Secondary | ICD-10-CM | POA: Diagnosis present

## 2017-03-29 DIAGNOSIS — Z91048 Other nonmedicinal substance allergy status: Secondary | ICD-10-CM

## 2017-03-29 DIAGNOSIS — J9611 Chronic respiratory failure with hypoxia: Secondary | ICD-10-CM | POA: Diagnosis not present

## 2017-03-29 DIAGNOSIS — Z888 Allergy status to other drugs, medicaments and biological substances status: Secondary | ICD-10-CM | POA: Diagnosis not present

## 2017-03-29 DIAGNOSIS — F1010 Alcohol abuse, uncomplicated: Secondary | ICD-10-CM | POA: Diagnosis present

## 2017-03-29 DIAGNOSIS — J449 Chronic obstructive pulmonary disease, unspecified: Secondary | ICD-10-CM | POA: Diagnosis present

## 2017-03-29 DIAGNOSIS — Z8249 Family history of ischemic heart disease and other diseases of the circulatory system: Secondary | ICD-10-CM

## 2017-03-29 DIAGNOSIS — Z7952 Long term (current) use of systemic steroids: Secondary | ICD-10-CM | POA: Diagnosis not present

## 2017-03-29 DIAGNOSIS — J441 Chronic obstructive pulmonary disease with (acute) exacerbation: Principal | ICD-10-CM | POA: Diagnosis present

## 2017-03-29 DIAGNOSIS — I10 Essential (primary) hypertension: Secondary | ICD-10-CM | POA: Diagnosis present

## 2017-03-29 DIAGNOSIS — Z79899 Other long term (current) drug therapy: Secondary | ICD-10-CM

## 2017-03-29 LAB — COMPREHENSIVE METABOLIC PANEL
ALT: 23 U/L (ref 17–63)
AST: 27 U/L (ref 15–41)
Albumin: 4 g/dL (ref 3.5–5.0)
Alkaline Phosphatase: 76 U/L (ref 38–126)
Anion gap: 9 (ref 5–15)
BUN: 9 mg/dL (ref 6–20)
CO2: 25 mmol/L (ref 22–32)
Calcium: 8.8 mg/dL — ABNORMAL LOW (ref 8.9–10.3)
Chloride: 104 mmol/L (ref 101–111)
Creatinine, Ser: 0.94 mg/dL (ref 0.61–1.24)
GFR calc Af Amer: >60 mL/min (ref >60)
GFR calc non Af Amer: >60 mL/min (ref >60)
Glucose, Bld: 106 mg/dL — ABNORMAL HIGH (ref 65–99)
Potassium: 3.7 mmol/L (ref 3.5–5.1)
Sodium: 138 mmol/L (ref 135–145)
Total Bilirubin: 1.5 mg/dL — ABNORMAL HIGH (ref 0.3–1.2)
Total Protein: 7.4 g/dL (ref 6.5–8.1)

## 2017-03-29 LAB — CBC WITH DIFFERENTIAL/PLATELET
Basophils Absolute: 0 10*3/uL (ref 0.0–0.1)
Basophils Relative: 0 %
Eosinophils Absolute: 0.2 10*3/uL (ref 0.0–0.7)
Eosinophils Relative: 1 %
HCT: 50.6 % (ref 39.0–52.0)
Hemoglobin: 17.1 g/dL — ABNORMAL HIGH (ref 13.0–17.0)
Lymphocytes Relative: 42 %
Lymphs Abs: 6.5 10*3/uL — ABNORMAL HIGH (ref 0.7–4.0)
MCH: 33.5 pg (ref 26.0–34.0)
MCHC: 33.8 g/dL (ref 30.0–36.0)
MCV: 99 fL (ref 78.0–100.0)
Monocytes Absolute: 1.7 10*3/uL — ABNORMAL HIGH (ref 0.1–1.0)
Monocytes Relative: 11 %
Neutro Abs: 7.1 10*3/uL (ref 1.7–7.7)
Neutrophils Relative %: 46 %
Platelets: 237 10*3/uL (ref 150–400)
RBC: 5.11 MIL/uL (ref 4.22–5.81)
RDW: 14.6 % (ref 11.5–15.5)
WBC: 15.5 10*3/uL — ABNORMAL HIGH (ref 4.0–10.5)

## 2017-03-29 LAB — I-STAT TROPONIN, ED: Troponin i, poc: 0.01 ng/mL (ref 0.00–0.08)

## 2017-03-29 LAB — I-STAT ARTERIAL BLOOD GAS, ED
Acid-base deficit: 2 mmol/L (ref 0.0–2.0)
Bicarbonate: 25.7 mmol/L (ref 20.0–28.0)
O2 Saturation: 96 %
Patient temperature: 98.6
TCO2: 27 mmol/L (ref 0–100)
pCO2 arterial: 53 mmHg — ABNORMAL HIGH (ref 32.0–48.0)
pH, Arterial: 7.294 — ABNORMAL LOW (ref 7.350–7.450)
pO2, Arterial: 89 mmHg (ref 83.0–108.0)

## 2017-03-29 LAB — MRSA PCR SCREENING: MRSA by PCR: NEGATIVE

## 2017-03-29 LAB — HIV ANTIBODY (ROUTINE TESTING W REFLEX): HIV Screen 4th Generation wRfx: NONREACTIVE

## 2017-03-29 IMAGING — CR DG CHEST 1V PORT
2 series · 2 of 2 positions shown · non-contrast
Comparison: [DATE], [DATE], [DATE]

CLINICAL DATA: Chronic shortness of breath

EXAM:
PORTABLE CHEST 1 VIEW

[AP (1 of 2)]
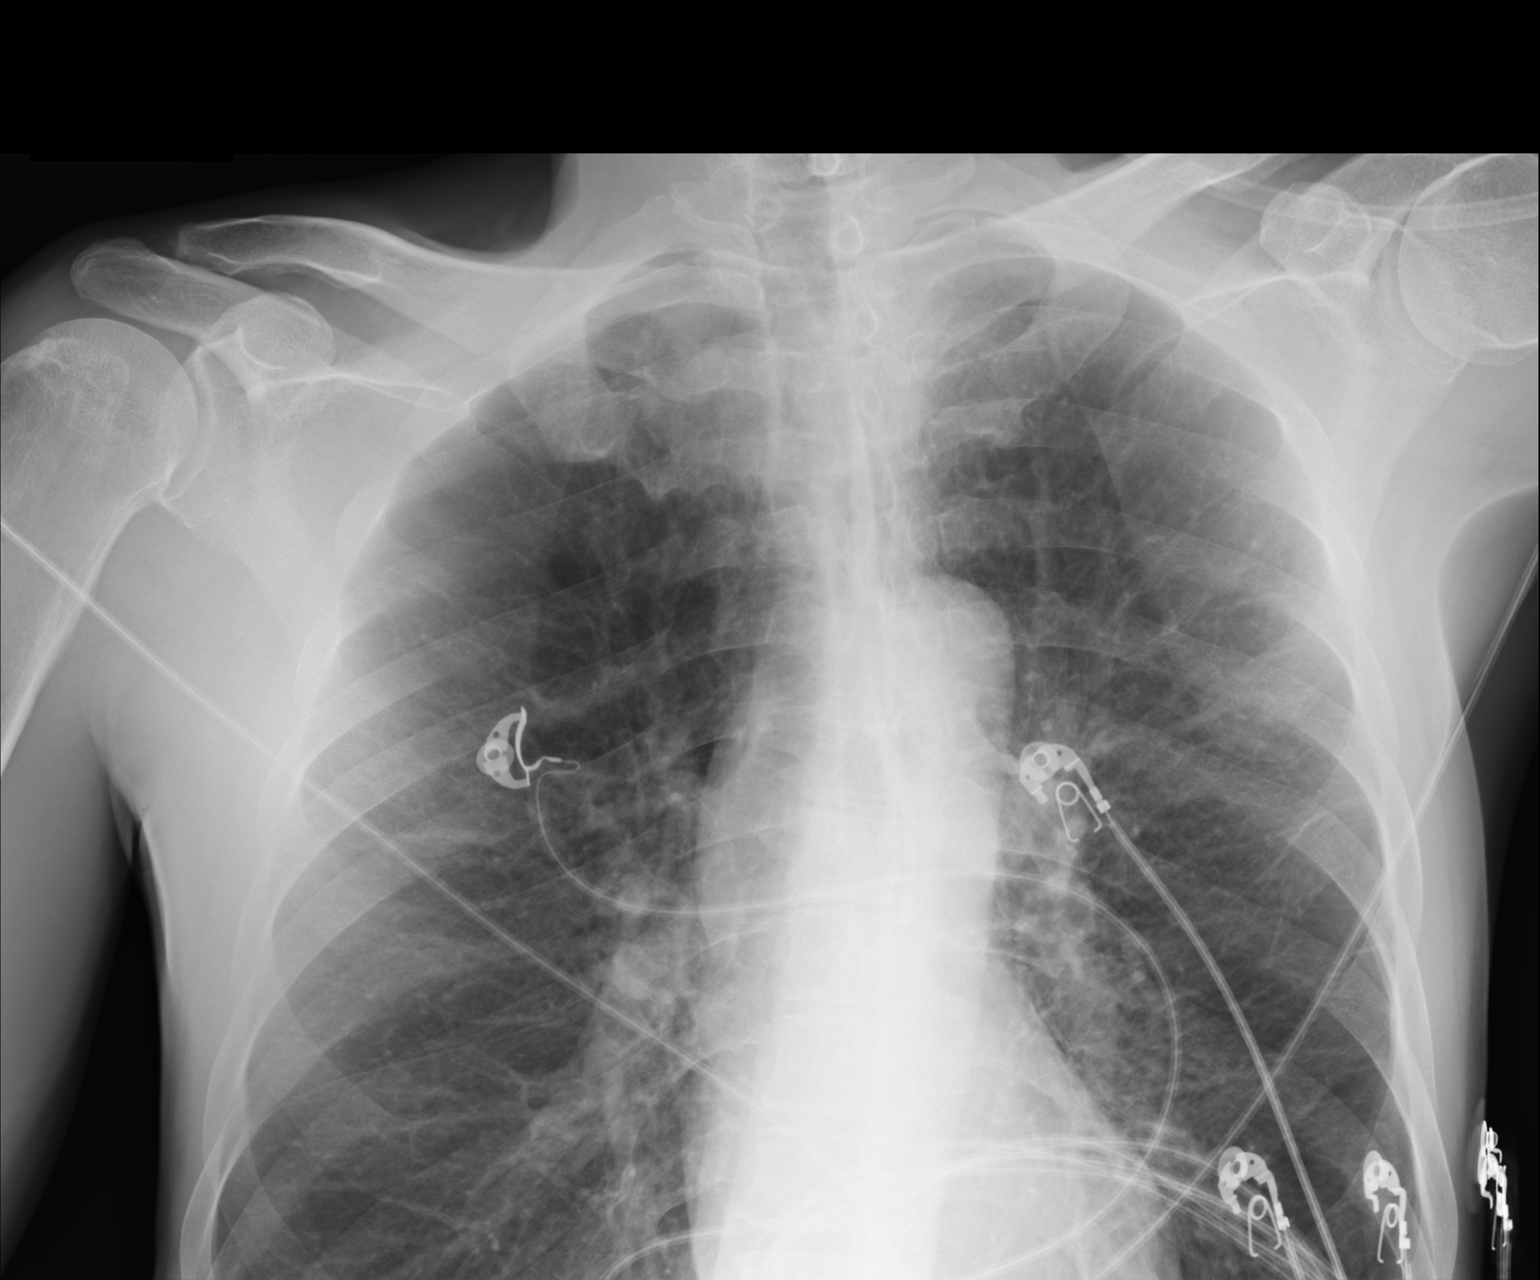

[AP (2 of 2)]
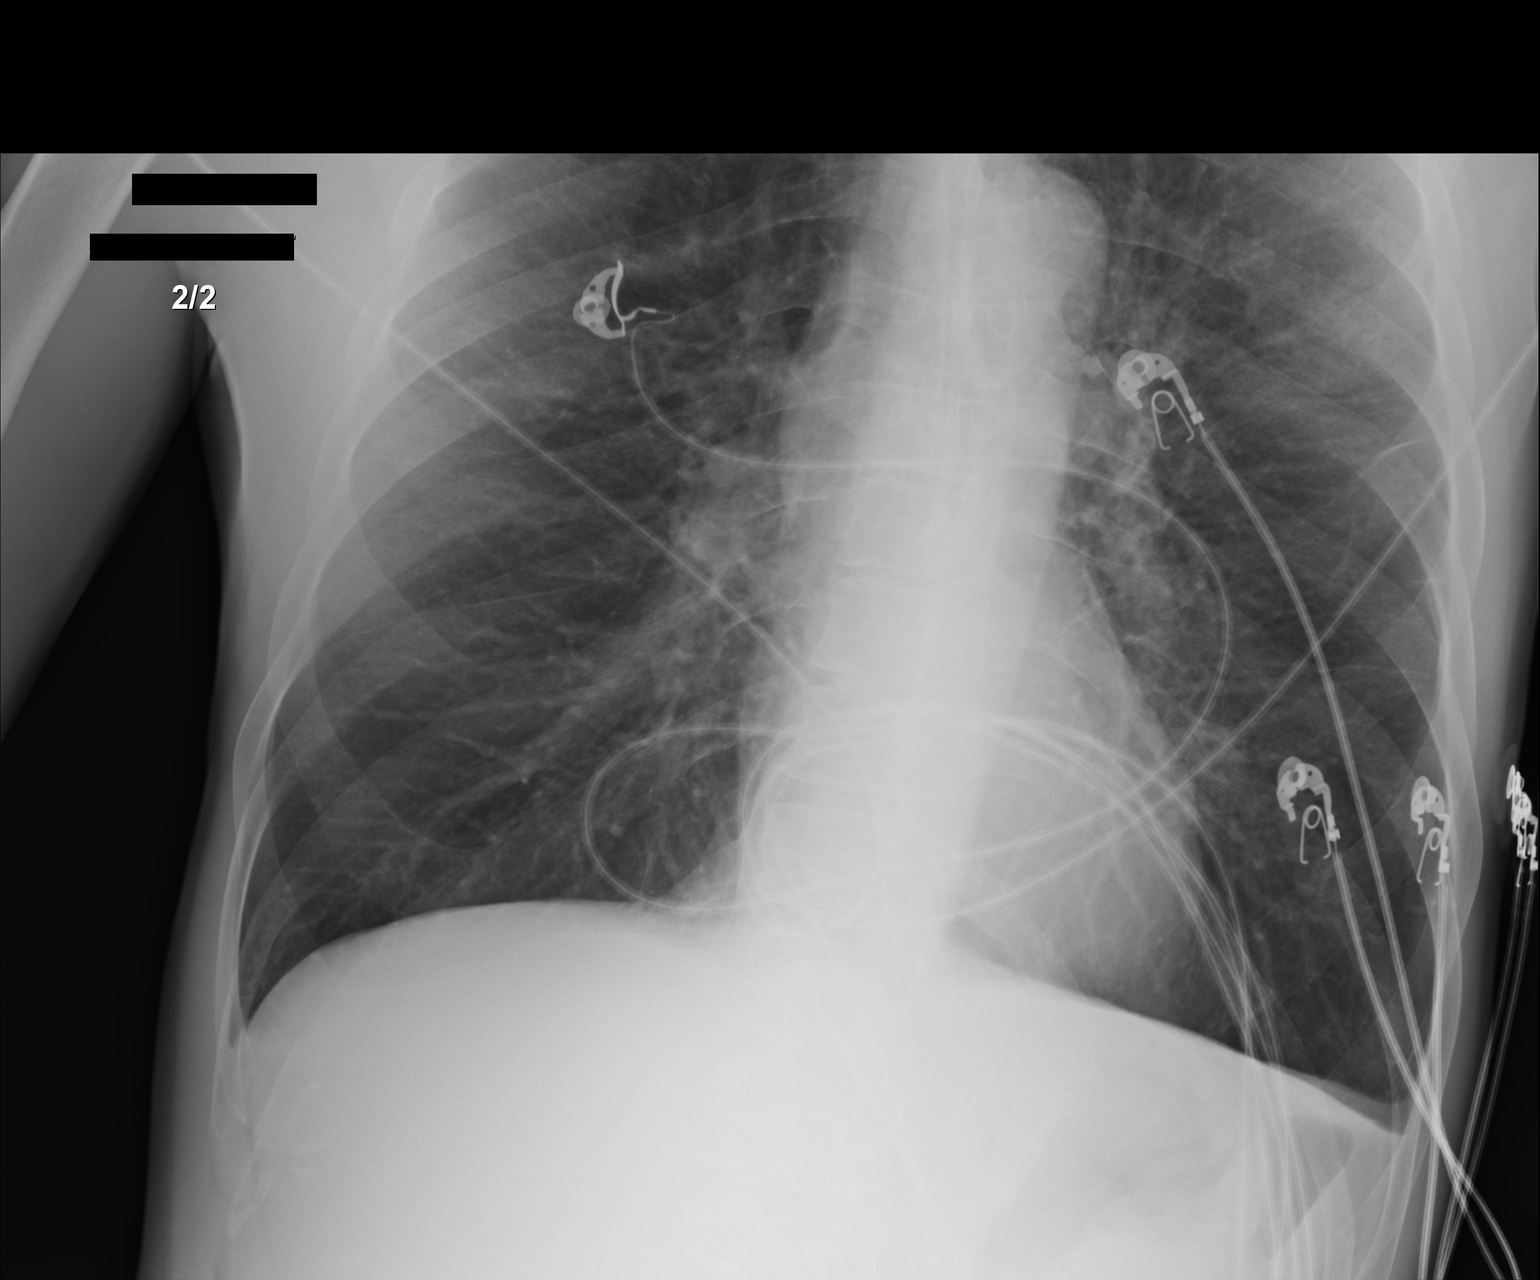

[2 of 2 positions shown; findings below may reference images not displayed]

FINDINGS: Hyperinflation. Vague opacity left upper lobe unchanged. No acute
consolidation or effusion. Stable cardiomediastinal silhouette. No
pneumothorax.
IMPRESSION: Hyperinflation without focal infiltrate or edema.

## 2017-03-29 MED ORDER — TRAMADOL HCL 50 MG PO TABS: 100.0000 mg | ORAL_TABLET | Freq: Four times a day (QID) | ORAL | Status: DC | PRN

## 2017-03-29 MED ORDER — LORAZEPAM 2 MG/ML IJ SOLN: 1.0000 mg | Freq: Four times a day (QID) | INTRAMUSCULAR | Status: DC | PRN

## 2017-03-29 MED ORDER — MIRTAZAPINE 7.5 MG PO TABS
30.0000 mg | ORAL_TABLET | Freq: Every day | ORAL | Status: DC
  Filled 2017-03-29: qty 4

## 2017-03-29 MED ORDER — LOXAPINE SUCCINATE 5 MG PO CAPS
10.0000 mg | ORAL_CAPSULE | Freq: Every day | ORAL | Status: DC
  Filled 2017-03-29: qty 2

## 2017-03-29 MED ORDER — AMLODIPINE BESYLATE 10 MG PO TABS
10.0000 mg | ORAL_TABLET | Freq: Every day | ORAL | Status: DC
  Administered 2017-03-29 – 2017-03-30 (×2): 10 mg via ORAL
  Filled 2017-03-29 (×2): qty 1

## 2017-03-29 MED ORDER — TIOTROPIUM BROMIDE MONOHYDRATE 18 MCG IN CAPS
18.0000 ug | ORAL_CAPSULE | Freq: Every day | RESPIRATORY_TRACT | Status: DC
  Administered 2017-03-29 – 2017-03-30 (×2): 18 ug via RESPIRATORY_TRACT
  Filled 2017-03-29: qty 5

## 2017-03-29 MED ORDER — BENZTROPINE MESYLATE 0.5 MG PO TABS
0.5000 mg | ORAL_TABLET | Freq: Every day | ORAL | Status: DC
  Filled 2017-03-29: qty 1

## 2017-03-29 MED ORDER — RISPERIDONE 2 MG PO TABS
2.0000 mg | ORAL_TABLET | Freq: Every day | ORAL | Status: DC
  Administered 2017-03-29: 2 mg via ORAL
  Filled 2017-03-29: qty 1

## 2017-03-29 MED ORDER — VITAMIN B-1 100 MG PO TABS
100.0000 mg | ORAL_TABLET | Freq: Every day | ORAL | Status: DC
  Administered 2017-03-29 – 2017-03-30 (×2): 100 mg via ORAL
  Filled 2017-03-29 (×2): qty 1

## 2017-03-29 MED ORDER — ADULT MULTIVITAMIN W/MINERALS CH
1.0000 | ORAL_TABLET | Freq: Every day | ORAL | Status: DC
  Administered 2017-03-29 – 2017-03-30 (×2): 1 via ORAL
  Filled 2017-03-29 (×2): qty 1

## 2017-03-29 MED ORDER — FOLIC ACID 1 MG PO TABS
1.0000 mg | ORAL_TABLET | Freq: Every day | ORAL | Status: DC
  Administered 2017-03-29 – 2017-03-30 (×2): 1 mg via ORAL
  Filled 2017-03-29 (×2): qty 1

## 2017-03-29 MED ORDER — ALBUTEROL SULFATE (2.5 MG/3ML) 0.083% IN NEBU
2.5000 mg | INHALATION_SOLUTION | RESPIRATORY_TRACT | Status: DC | PRN
  Filled 2017-03-29: qty 3

## 2017-03-29 MED ORDER — IPRATROPIUM BROMIDE 0.02 % IN SOLN
0.5000 mg | Freq: Once | RESPIRATORY_TRACT | Status: AC
  Administered 2017-03-29: 0.5 mg via RESPIRATORY_TRACT
  Filled 2017-03-29: qty 2.5

## 2017-03-29 MED ORDER — NICOTINE 21 MG/24HR TD PT24
21.0000 mg | MEDICATED_PATCH | Freq: Every day | TRANSDERMAL | Status: DC
  Administered 2017-03-29: 21 mg via TRANSDERMAL
  Filled 2017-03-29 (×2): qty 1

## 2017-03-29 MED ORDER — ENOXAPARIN SODIUM 40 MG/0.4ML ~~LOC~~ SOLN
40.0000 mg | SUBCUTANEOUS | Status: DC
  Administered 2017-03-29: 40 mg via SUBCUTANEOUS
  Filled 2017-03-29: qty 0.4

## 2017-03-29 MED ORDER — LORATADINE 10 MG PO TABS
10.0000 mg | ORAL_TABLET | Freq: Every day | ORAL | Status: DC
  Administered 2017-03-29 – 2017-03-30 (×2): 10 mg via ORAL
  Filled 2017-03-29 (×2): qty 1

## 2017-03-29 MED ORDER — MAGNESIUM SULFATE 2 GM/50ML IV SOLN
2.0000 g | Freq: Once | INTRAVENOUS | Status: AC
  Administered 2017-03-29: 2 g via INTRAVENOUS
  Filled 2017-03-29: qty 50

## 2017-03-29 MED ORDER — ORAL CARE MOUTH RINSE
15.0000 mL | Freq: Two times a day (BID) | OROMUCOSAL | Status: DC
  Administered 2017-03-29: 15 mL via OROMUCOSAL

## 2017-03-29 MED ORDER — ALBUTEROL (5 MG/ML) CONTINUOUS INHALATION SOLN
15.0000 mg/h | INHALATION_SOLUTION | Freq: Once | RESPIRATORY_TRACT | Status: AC
  Administered 2017-03-29: 15 mg/h via RESPIRATORY_TRACT
  Filled 2017-03-29: qty 20

## 2017-03-29 MED ORDER — ACETAMINOPHEN 325 MG PO TABS: 650.0000 mg | ORAL_TABLET | Freq: Four times a day (QID) | ORAL | Status: DC | PRN

## 2017-03-29 MED ORDER — ALBUTEROL SULFATE HFA 108 (90 BASE) MCG/ACT IN AERS: 2.0000 | INHALATION_SPRAY | RESPIRATORY_TRACT | Status: DC | PRN

## 2017-03-29 MED ORDER — THIAMINE HCL 100 MG/ML IJ SOLN: 100.0000 mg | Freq: Every day | INTRAMUSCULAR | Status: DC

## 2017-03-29 MED ORDER — LORAZEPAM 1 MG PO TABS: 1.0000 mg | ORAL_TABLET | Freq: Four times a day (QID) | ORAL | Status: DC | PRN

## 2017-03-29 MED ORDER — ACETAMINOPHEN 650 MG RE SUPP: 650.0000 mg | Freq: Four times a day (QID) | RECTAL | Status: DC | PRN

## 2017-03-29 MED ORDER — FLUTICASONE FUROATE-VILANTEROL 200-25 MCG/INH IN AEPB
1.0000 | INHALATION_SPRAY | Freq: Every day | RESPIRATORY_TRACT | Status: DC
  Administered 2017-03-29 – 2017-03-30 (×2): 1 via RESPIRATORY_TRACT
  Filled 2017-03-29: qty 28

## 2017-03-29 MED ORDER — PANTOPRAZOLE SODIUM 40 MG PO TBEC
40.0000 mg | DELAYED_RELEASE_TABLET | Freq: Every day | ORAL | Status: DC
  Administered 2017-03-29 – 2017-03-30 (×2): 40 mg via ORAL
  Filled 2017-03-29 (×2): qty 1

## 2017-03-29 MED ORDER — LEVOFLOXACIN IN D5W 750 MG/150ML IV SOLN
750.0000 mg | Freq: Every day | INTRAVENOUS | Status: DC
  Administered 2017-03-29 – 2017-03-30 (×2): 750 mg via INTRAVENOUS
  Filled 2017-03-29 (×3): qty 150

## 2017-03-29 MED ORDER — MULTIVITAMINS PO CAPS: 1.0000 | ORAL_CAPSULE | Freq: Every day | ORAL | Status: DC

## 2017-03-29 MED ORDER — METHYLPREDNISOLONE SODIUM SUCC 125 MG IJ SOLR
60.0000 mg | Freq: Two times a day (BID) | INTRAMUSCULAR | Status: DC
Start: 2017-03-29 — End: 2017-03-30
  Administered 2017-03-29 – 2017-03-30 (×3): 60 mg via INTRAVENOUS
  Filled 2017-03-29 (×3): qty 2

## 2017-03-29 MED ORDER — FLUTICASONE PROPIONATE 50 MCG/ACT NA SUSP
2.0000 | Freq: Every day | NASAL | Status: DC
  Filled 2017-03-29: qty 16

## 2017-03-29 NOTE — ED Provider Notes (Signed)
Tony Sullivan DEPT Provider Note   CSN: [id number] Arrival date & time: 03/29/17  0007   By signing my name below, I, Tony Sullivan, attest that this documentation has been prepared under the direction and in the presence of Drenda Freeze, MD. Electronically Signed: [name], ED Scribe. 03/29/17. 12:29 AM.  History   Chief Complaint Chief Complaint  Patient presents with  . Shortness of Breath    HPI Tony Sullivan is a 56 y.o. male with a PMHx of COPD, emphysema, who presents to the Emergency Department brought in by EMS complaining of SOB onset today. Pt reports associated chest tightness. Pt has tried 2 neb treatments and inhaler with no relief of his symptoms. He states that he was on 5 L of O2 while at home PTA. Pt was given 5 mg albuterol, 0.5 mg atrovent, and 125 mg solu-medrol while en route to the ED. He reports that he saw his pulmonologist, Dr. Leonides Schanz last week with a normal check up. Pt states that he takes symbicort daily. He denies fever, cough, leg swelling, and any other symptoms. He notes that he smokes up to 1 PPD of cigarettes.     The history is provided by the patient. No language interpreter was used.    Past Medical History:  Diagnosis Date  . Asthma   . COPD (chronic obstructive pulmonary disease) ([place]) 2004   diagnosed in 2004, no PFT's to date.  Started on home O2 12/2013, after found to be desatting at PCP's office, and referred to pulmonology.  . High blood pressure   . Seasonal allergies     Patient Active Problem List   Diagnosis Date Noted  . Special screening for malignant neoplasms, colon   . Benign neoplasm of ascending colon   . Benign neoplasm of descending colon   . Hoarseness 08/22/2016  . GERD (gastroesophageal reflux disease) 08/22/2016  . Atypical chest pain 12/05/2015  . Essential hypertension 06/15/2014  . Pectoralis muscle strain 01/11/2014  . COPD with acute exacerbation ([place]) 01/10/2014  . COPD  GOLD II 01/08/2014  .  Smoker 01/08/2014  . Cough 01/08/2014  . Chronic respiratory failure with hypoxia ([place]) 01/08/2014    Past Surgical History:  Procedure Laterality Date  . APPENDECTOMY  1980  . COLONOSCOPY WITH PROPOFOL N/A 12/10/2016   Procedure: COLONOSCOPY WITH PROPOFOL;  Surgeon: [name], MD;  Location: [facility];  Service: Endoscopy;  Laterality: N/A;  . HIP SURGERY Right   . SHOULDER ARTHROSCOPY Right        Home Medications    Prior to Admission medications   Medication Sig Start Date End Date Taking? Authorizing Provider  Aclidinium Bromide (TUDORZA PRESSAIR) 400 MCG/ACT AEPB Inhale 1 puff into the lungs 2 (two) times daily. 12/05/16   [name], MD  albuterol [facility] HFA) [phone] Base) MCG/ACT inhaler 2 puffs up to every 4 hours if needed for short of breath 12/05/16   [name], MD  albuterol (PROVENTIL) (2.5 MG/3ML) 0.083% nebulizer solution Take 2.5 mg by nebulization every 4 (four) hours as needed for wheezing or shortness of breath.     [provider]  amLODipine (NORVASC) 10 MG tablet Take 10 mg by mouth every morning.     [provider]  benztropine (COGENTIN) 0.5 MG tablet Take 0.5 mg by mouth at bedtime.     [provider]  budesonide-formoterol (SYMBICORT) 160-4.5 MCG/ACT inhaler INHALE TWO PUFFS INTO THE LUNGS TWO (TWO) TIMES DAILY. 12/05/16  [name], MD  cetirizine (ZYRTEC) 10 MG tablet Take 10 mg by mouth daily.    [provider]  dextromethorphan-guaiFENesin (MUCINEX DM) 30-600 MG 12hr tablet Take 1 tablet by mouth 2 (two) times daily as needed for cough.    [provider]  famotidine (PEPCID) 20 MG tablet TAKE 1 TABLET BY MOUTH AFTER BREAKFAST AND SUPPER 03/18/17   [name], MD  fluticasone (FLONASE) 50 MCG/ACT nasal spray PLACE TWO SPRAYS INTO BOTH NOSTRILS THREE TIMES DAILY AS NEEDED FOR ALLERGIES. 02/28/17   [name], MD  loxapine (LOXITANE) 10 MG capsule Take 10 mg by mouth at bedtime.      [provider]  mirtazapine (REMERON) 30 MG tablet Take 30 mg by mouth at bedtime.    [provider]  Multiple Vitamin (MULTIVITAMIN) capsule Take 1 capsule by mouth daily.    [provider]  omeprazole (PRILOSEC) 20 MG capsule TAKE 1 CAPSULE (20 MG TOTAL) BY MOUTH DAILY. 12/31/16   [name], MD  OXYGEN Inhale 2-3 L into the lungs as directed. 2-3 liters with sleep and exertion as needed for low oxygen    [provider]  predniSONE (DELTASONE) 10 MG tablet Take  2 daily until better then 1 daily 03/27/17   Parrett, Fonnie Mu, NP  Respiratory Therapy Supplies (FLUTTER) DEVI Use as directed 04/01/16   [name], MD  risperiDONE (RISPERDAL) 2 MG tablet Take 2 mg by mouth at bedtime.    [provider]  traMADol (ULTRAM) 50 MG tablet Take 100 mg by mouth every 6 (six) hours as needed for moderate pain.    [provider]  valsartan (DIOVAN) 160 MG tablet Take 1 tablet (160 mg total) by mouth daily. 08/30/14   [name], MD    Family History Family History  Problem Relation Age of Onset  . Emphysema Mother   . Allergies Mother        "everyone in family"  . Asthma Mother        "everyone in family"  . Heart disease Mother   . Clotting disorder Mother   . Cancer Mother   . Emphysema Father   . Allergies Father   . Allergies Son   . Heart disease Maternal Aunt     Social History Social History  Substance Use Topics  . Smoking status: Former Smoker    Packs/day: 1.00    Years: 36.00    Types: Cigarettes    Quit date: 11/28/2016  . Smokeless tobacco: Never Used  . Alcohol use 8.4 - 12.6 oz/week    14 - 21 Standard drinks or equivalent per week     Comment: 2-3 beers/day, with occasional "binges".  No history of withdrawal     Allergies   Penicillins; Sulfa antibiotics; Famotidine; and Levocetirizine   Review of Systems Review of Systems  Respiratory: Positive for chest tightness and shortness of breath.  Negative for cough.   Cardiovascular: Negative for leg swelling.  All other systems reviewed and are negative.    Physical Exam Updated Vital Signs BP (!) 167/97 (BP Location: Right Arm)   Pulse 95   Temp 98.9 F (37.2 C) (Oral)   Resp (!) 26   Ht 5\' 11"  (1.803 m)   Wt 152 lb (68.9 kg)   SpO2 100%   BMI 21.20 kg/m   Physical Exam  Constitutional: He is oriented to person, place, and time. He appears well-developed and well-nourished. No distress.  HENT:  Head: Normocephalic and atraumatic.  Eyes: EOM are normal.  Neck: Neck supple.  Cardiovascular: Normal rate, regular rhythm and normal heart sounds.  Exam reveals no gallop and no friction rub.   No murmur heard. Pulmonary/Chest: Effort normal. Tachypnea noted. No respiratory distress. He has wheezes.  Tachypneic. Obvious retractions. Minimal wheezing. Poor air movement.   Abdominal: He exhibits no distension.  Musculoskeletal: Normal range of motion.  No pedal edema.   Neurological: He is alert and oriented to person, place, and time.  Skin: Skin is warm and dry.  Psychiatric: He has a normal mood and affect. His behavior is normal.  Nursing note and vitals reviewed.    ED Treatments / Results  DIAGNOSTIC STUDIES: Oxygen Saturation is 100% on RA, nl by my interpretation.    COORDINATION OF CARE: 12:14 AM Discussed treatment plan with pt at bedside and pt agreed to plan.   Labs (all labs ordered are listed, but only abnormal results are displayed) Labs Reviewed  CBC WITH DIFFERENTIAL/PLATELET - Abnormal; Notable for the following:       Result Value   WBC 15.5 (*)    Hemoglobin 17.1 (*)    All other components within normal limits  COMPREHENSIVE METABOLIC PANEL - Abnormal; Notable for the following:    Glucose, Bld 106 (*)    Calcium 8.8 (*)    Total Bilirubin 1.5 (*)    All other components within normal limits  I-STAT TROPOININ, ED    EKG  EKG Interpretation  Date/Time:  Saturday March 29 2017  00:15:04 EDT Ventricular Rate:  94 PR Interval:    QRS Duration: 76 QT Interval:  368 QTC Calculation: 463 R Axis:   7 Text Interpretation:  Sinus rhythm Biatrial enlargement Probable anteroseptal infarct, old No significant change since last tracing Confirmed by Wandra Arthurs ([id number]) on 03/29/2017 12:24:19 AM       Radiology Dg Chest Port 1 View  Result Date: 03/29/2017 CLINICAL DATA:  Chronic shortness of breath EXAM: PORTABLE CHEST 1 VIEW COMPARISON:  02/25/2017, 07/17/2016, 10/25/2015 FINDINGS: Hyperinflation. Vague opacity left upper lobe unchanged. No acute consolidation or effusion. Stable cardiomediastinal silhouette. No pneumothorax. IMPRESSION: Hyperinflation without focal infiltrate or edema. Electronically Signed   By: Tony Sullivan M.D.   On: 03/29/2017 00:29    Procedures Procedures (including critical care time)  CRITICAL CARE Performed by: Wandra Arthurs   Total critical care time: 30 minutes  Critical care time was exclusive of separately billable procedures and treating other patients.  Critical care was necessary to treat or prevent imminent or life-threatening deterioration.  Critical care was time spent personally by me on the following activities: development of treatment plan with patient and/or surrogate as well as nursing, discussions with consultants, evaluation of patient's response to treatment, examination of patient, obtaining history from patient or surrogate, ordering and performing treatments and interventions, ordering and review of laboratory studies, ordering and review of radiographic studies, pulse oximetry and re-evaluation of patient's condition.   Medications Ordered in ED Medications - No data to display   Initial Impression / Assessment and Plan / ED Course  I have reviewed the triage vital signs and the nursing notes.  Pertinent labs & imaging results that were available during my care of the patient were reviewed by me and considered in  my medical decision making (see chart for details).     ANSON PEDDIE is a 56 y.o. male here with shortness of breath. He is still smoking. Has  hx of COPD. Likely COPD exacerbation. Given solumedrol, duoneb by EMS. Still tachypneic, will give continuous neb, magnesium. Will reassess.   2:53 AM Still tachypneic. ABG showed pH 7.29, CO2 53. I ordered bipap but he refused. He states that he wants to be on nasal cannula. Titrated up to 6 L. Labs and CXR unremarkable. Hospitalist to admit to stepdown for COPD exacerbation   Final Clinical Impressions(s) / ED Diagnoses   Final diagnoses:  None    New Prescriptions New Prescriptions   No medications on file   I personally performed the services described in this documentation, which was scribed in my presence. The recorded information has been reviewed and is accurate.     Drenda Freeze, MD 03/29/17 [phone]

## 2017-03-29 NOTE — ED Triage Notes (Signed)
Per EMS, pt from home with shob that has worsened today, hx of copd/emphysema, wears 2L [place] all the time. Pt took neb at home and inhaler without relief. Breath sounds diminished. Pt refused CPAP and has been very argumentative with EMS. VS BP 158/100, HR 90, RR 30, spo2 98% on Neb. Pt received duoneb(5 albuterol and 0.5 atrovent) and 125 solumedrol.

## 2017-03-29 NOTE — ED Notes (Signed)
Portable in room.  

## 2017-03-29 NOTE — Progress Notes (Signed)
PROGRESS NOTE                                                                                                                                                                                                             Patient Demographics:    Tony Sullivan, is a 56 y.o. male, DOB - 05-May-1961, BDZ:[id number]  Admit date - 03/29/2017   Admitting Physician Etta Quill, DO  Outpatient Primary MD for the patient is [name], FNP  LOS - 0  Outpatient Specialists:Dr. [name]  Chief Complaint  Patient presents with  . Shortness of Breath       Brief Narrative   56 year old male with COPD on home O2 (only uses as needed, on chronic steroid), ongoing heavy smoking and alcohol use presented with one-day history of shortness of breath and chest tightness. He was requiring 5 L oxygen at home Patient did not take his medications on the day of admission as he was intoxicated with alcohol. Patient was seen by his pulmonologist on 5/31 and treated for acute COPD flare. Patient was given albuterol and Atrovent nebs and 125 mg IV Solu-Medrol and out to the ED with some improvement. ABG done in the ED showed respiratory acidosis with hypercapnia. He refused BiPAP. Admitted to telemetry.     Subjective:   Patient informs his breathing to be better since he got admitted. Denies any cough or chest tightness.   Assessment  & Plan :    Principal Problem:   Acute respiratory failure with hypoxia and hypercapnia (HCC) Secondary to acute COPD exacerbation. Continue IV Solu-Medrol, scheduled DuoNeb's, supportive care with antitussives. Empiric Levaquin. Currently maintaining O2 sat on 2 L via nasal cannula. Keep sats between 88-92%. Continues to smoke heavily (almost 2 packs per day). Order nicotine patch and counseled strongly on cessation. Resume home inhalers.     Active Problems:  Alcohol abuse. And lately has been drinking  heavily citing multiple celebrations at home. Reports having some tremors on the days he doesn't drink. Place him on CIWA. High risk of going into withdrawal. Added thiamine, folate and multivitamin.   Essential hypertension Continue amlodipine.  1.    Code Status : Full code  Family Communication  :Wife at bedside  Disposition Plan  :Home possibly in the next 1-2 days if continues to improve  Barriers For  Discharge : Active symptoms  Consults  : None  Procedures  : None  DVT Prophylaxis  :  Lovenox -   Lab Results  Component Value Date   PLT 237 03/29/2017    Antibiotics  :    Anti-infectives    Start     Dose/Rate Route Frequency Ordered Stop   03/29/17 0300  levofloxacin (LEVAQUIN) IVPB 750 mg     750 mg 100 mL/hr over 90 Minutes Intravenous Daily 03/29/17 0238          Objective:   Vitals:   03/29/17 0315 03/29/17 0330 03/29/17 0345 03/29/17 0435  BP: 102/69 106/67 104/68 136/88  Pulse: 88 88 86 88  Resp: 18 (!) 25 17 18   Temp:    98.4 F (36.9 C)  TempSrc:    Oral  SpO2: 96% 96% 96% 92%  Weight:    68 kg (149 lb 14.4 oz)  Height:    5\' 11"  (1.803 m)    Wt Readings from Last 3 Encounters:  03/29/17 68 kg (149 lb 14.4 oz)  03/27/17 69.3 kg (152 lb 12.8 oz)  02/25/17 69.4 kg (153 lb)     Intake/Output Summary (Last 24 hours) at 03/29/17 1003 Last data filed at 03/29/17 0843  Gross per 24 hour  Intake              300 ml  Output                0 ml  Net              300 ml     Physical Exam  Gen: not in distress HEENT:  moist mucosa, supple neck Chest:Diminished bilateral breath sounds  CVS: N S1&S2, no murmurs,  GI: soft, NT, ND, Musculoskeletal: warm, no edema CNS: Alert and oriented, no tremors    Data Review:    CBC  Recent Labs Lab 03/29/17 0010  WBC 15.5*  HGB 17.1*  HCT 50.6  PLT 237  MCV 99.0  MCH 33.5  MCHC 33.8  RDW 14.6  LYMPHSABS 6.5*  MONOABS 1.7*  EOSABS 0.2  BASOSABS 0.0    Chemistries   Recent  Labs Lab 03/29/17 0010  NA 138  K 3.7  CL 104  CO2 25  GLUCOSE 106*  BUN 9  CREATININE 0.94  CALCIUM 8.8*  AST 27  ALT 23  ALKPHOS 76  BILITOT 1.5*   ------------------------------------------------------------------------------------------------------------------ No results for input(s): CHOL, HDL, LDLCALC, TRIG, CHOLHDL, LDLDIRECT in the last 72 hours.  No results found for: HGBA1C ------------------------------------------------------------------------------------------------------------------ No results for input(s): TSH, T4TOTAL, T3FREE, THYROIDAB in the last 72 hours.  Invalid input(s): FREET3 ------------------------------------------------------------------------------------------------------------------ No results for input(s): VITAMINB12, FOLATE, FERRITIN, TIBC, IRON, RETICCTPCT in the last 72 hours.  Coagulation profile No results for input(s): INR, PROTIME in the last 168 hours.  No results for input(s): DDIMER in the last 72 hours.  Cardiac Enzymes No results for input(s): CKMB, TROPONINI, MYOGLOBIN in the last 168 hours.  Invalid input(s): CK ------------------------------------------------------------------------------------------------------------------    Component Value Date/Time   BNP 31.4 06/17/2016 0844    Inpatient Medications  Scheduled Meds: . amLODipine  10 mg Oral Daily  . benztropine  0.5 mg Oral QHS  . enoxaparin (LOVENOX) injection  40 mg Subcutaneous Q24H  . fluticasone  2 spray Each Nare Daily  . fluticasone furoate-vilanterol  1 puff Inhalation QAC breakfast  . folic acid  1 mg Oral Daily  . loratadine  10 mg Oral Daily  .  loxapine  10 mg Oral QHS  . mouth rinse  15 mL Mouth Rinse BID  . methylPREDNISolone (SOLU-MEDROL) injection  60 mg Intravenous Q12H  . mirtazapine  30 mg Oral QHS  . multivitamin with minerals  1 tablet Oral Daily  . nicotine  21 mg Transdermal Daily  . pantoprazole  40 mg Oral Daily  . risperiDONE  2 mg  Oral QHS  . thiamine  100 mg Oral Daily   Or  . thiamine  100 mg Intravenous Daily  . tiotropium  18 mcg Inhalation Daily   Continuous Infusions: . levofloxacin (LEVAQUIN) IV Stopped (03/29/17 0650)   PRN Meds:.acetaminophen **OR** acetaminophen, albuterol, LORazepam **OR** LORazepam, traMADol  Micro Results Recent Results (from the past 240 hour(s))  MRSA PCR Screening     Status: None   Collection Time: 03/29/17  4:19 AM  Result Value Ref Range Status   MRSA by PCR NEGATIVE NEGATIVE Final    Comment:        The GeneXpert MRSA Assay (FDA approved for NASAL specimens only), is one component of a comprehensive MRSA colonization surveillance program. It is not intended to diagnose MRSA infection nor to guide or monitor treatment for MRSA infections.     Radiology Reports Dg Chest Port 1 View  Result Date: 03/29/2017 CLINICAL DATA:  Chronic shortness of breath EXAM: PORTABLE CHEST 1 VIEW COMPARISON:  02/25/2017, 07/17/2016, 10/25/2015 FINDINGS: Hyperinflation. Vague opacity left upper lobe unchanged. No acute consolidation or effusion. Stable cardiomediastinal silhouette. No pneumothorax. IMPRESSION: Hyperinflation without focal infiltrate or edema. Electronically Signed   By: Donavan Foil M.D.   On: 03/29/2017 00:29    Time Spent in minutes  25   Louellen Molder M.D on 03/29/2017 at 10:03 AM  Between 7am to 7pm - Pager - [phone]  After 7pm go to [email or web address] - password [facility]  [facility] -  Office  [phone]

## 2017-03-29 NOTE — ED Notes (Signed)
Attempted report 

## 2017-03-29 NOTE — H&P (Addendum)
History and Physical    Tony Sullivan DPO:[id number] DOB: 30-Jun-1961 DOA: 03/29/2017  PCP: [name], FNP  Patient coming from: Home  I have personally briefly reviewed patient's old medical records in [place]  Chief Complaint: SOB  HPI: Tony Sullivan is a 56 y.o. male with medical history significant of COPD O2 dependent and steroid dependent at baseline, patient of Dr. Gustavus Bryant.  Patient presents to the ED with c/o chest tightness and SOB.  Symptoms onset earlier in the day.  2 neb treatments, inhaler, and 5L O2 via [place] without relief.  Brought in given albuterol, atrovent, solumedrol with improvement.  Patient declined BIPAP after ABGs did show some acute resp acidosis.  Now resting comfortably.    Review of Systems: As per HPI otherwise 10 point review of systems negative.   Past Medical History:  Diagnosis Date  . Asthma   . COPD (chronic obstructive pulmonary disease) ([place]) 2004   diagnosed in 2004, no PFT's to date.  Started on home O2 12/2013, after found to be desatting at PCP's office, and referred to pulmonology.  . High blood pressure   . Seasonal allergies     Past Surgical History:  Procedure Laterality Date  . APPENDECTOMY  1980  . COLONOSCOPY WITH PROPOFOL N/A 12/10/2016   Procedure: COLONOSCOPY WITH PROPOFOL;  Surgeon: [name], MD;  Location: [facility] ENDOSCOPY;  Service: Endoscopy;  Laterality: N/A;  . HIP SURGERY Right   . SHOULDER ARTHROSCOPY Right      reports that he quit smoking about 3 months ago. His smoking use included Cigarettes. He has a 36.00 pack-year smoking history. He has never used smokeless tobacco. He reports that he drinks about 8.4 - 12.6 oz of alcohol per week . He reports that he does not use drugs.  Allergies  Allergen Reactions  . Penicillins Anaphylaxis    Has patient had a PCN reaction causing immediate rash, facial/tongue/throat swelling, SOB or lightheadedness with hypotension: Yes Has patient had a PCN reaction  causing severe rash involving mucus membranes or skin necrosis: No Has patient had a PCN reaction that required hospitalization No Has patient had a PCN reaction occurring within the last 10 years: No If all of the above answers are "NO", then may proceed with Cephalosporin use.   . Sulfa Antibiotics Swelling    Swelling of hands and throat   . Famotidine Other (See Comments)    Bloating of stomach, loss of appetite  . Levocetirizine Other (See Comments)    Muscle cramps    Family History  Problem Relation Age of Onset  . Emphysema Mother   . Allergies Mother        "everyone in family"  . Asthma Mother        "everyone in family"  . Heart disease Mother   . Clotting disorder Mother   . Cancer Mother   . Emphysema Father   . Allergies Father   . Allergies Son   . Heart disease Maternal Aunt      Prior to Admission medications   Medication Sig Start Date End Date Taking? Authorizing Provider  Aclidinium Bromide (TUDORZA PRESSAIR) 400 MCG/ACT AEPB Inhale 1 puff into the lungs 2 (two) times daily. 12/05/16  Yes [name], MD  albuterol (PROAIR HFA) 108 (90 Base) MCG/ACT inhaler 2 puffs up to every 4 hours if needed for short of breath Patient taking differently: Inhale 2 puffs into the lungs every 4 (four) hours as needed for  shortness of breath. 2 puffs up to every 4 hours if needed for short of breath 12/05/16  Yes [name], MD  albuterol (PROVENTIL) (2.5 MG/3ML) 0.083% nebulizer solution Take 2.5 mg by nebulization every 4 (four) hours as needed for wheezing or shortness of breath.    Yes [provider]  amLODipine (NORVASC) 10 MG tablet Take 10 mg by mouth every morning.    Yes [provider]  benztropine (COGENTIN) 0.5 MG tablet Take 0.5 mg by mouth at bedtime.    Yes [provider]  budesonide-formoterol (SYMBICORT) 160-4.5 MCG/ACT inhaler INHALE TWO PUFFS INTO THE LUNGS TWO (TWO) TIMES DAILY. 12/05/16  Yes [name], MD  cetirizine  (ZYRTEC) 10 MG tablet Take 10 mg by mouth daily.   Yes [provider]  fluticasone (FLONASE) 50 MCG/ACT nasal spray PLACE TWO SPRAYS INTO BOTH NOSTRILS THREE TIMES DAILY AS NEEDED FOR ALLERGIES. 02/28/17  Yes [name], MD  LISINOPRIL PO Take 1 tablet by mouth daily.   Yes [provider]  loxapine (LOXITANE) 10 MG capsule Take 10 mg by mouth at bedtime.    Yes [provider]  mirtazapine (REMERON) 30 MG tablet Take 30 mg by mouth at bedtime.   Yes [provider]  Multiple Vitamin (MULTIVITAMIN) capsule Take 1 capsule by mouth daily.   Yes [provider]  omeprazole (PRILOSEC) 20 MG capsule TAKE 1 CAPSULE (20 MG TOTAL) BY MOUTH DAILY. 12/31/16  Yes [name], MD  predniSONE (DELTASONE) 10 MG tablet Take  2 daily until better then 1 daily Patient taking differently: Take 10 mg by mouth daily with breakfast.  03/27/17  Yes Parrett, Tammy S, NP  risperiDONE (RISPERDAL) 2 MG tablet Take 2 mg by mouth at bedtime.   Yes [provider]  traMADol (ULTRAM) 50 MG tablet Take 100 mg by mouth every 6 (six) hours as needed for moderate pain.   Yes [provider]  OXYGEN Inhale 2-3 L into the lungs as directed. 2-3 liters with sleep and exertion as needed for low oxygen    [provider]  Respiratory Therapy Supplies (FLUTTER) DEVI Use as directed 04/01/16   [name], MD    Physical Exam: Vitals:   03/29/17 0100 03/29/17 0130 03/29/17 0145 03/29/17 0230  BP: (!) 147/87 (!) 175/90 129/89 122/89  Pulse: 88 95 93 94  Resp: (!) 27 (!) 23 (!) 26 (!) 22  Temp:      TempSrc:      SpO2: 100% 97% 99% 99%  Weight:      Height:        Constitutional: NAD, calm, comfortable Eyes: PERRL, lids and conjunctivae normal ENMT: Mucous membranes are moist. Posterior pharynx clear of any exudate or lesions.Normal dentition.  Neck: normal, supple, no masses, no thyromegaly Respiratory: Diffuse wheezing, no retractions, no  accessory muscle use at this time. Cardiovascular: Regular rate and rhythm, no murmurs / rubs / gallops. No extremity edema. 2+ pedal pulses. No carotid bruits.  Abdomen: no tenderness, no masses palpated. No hepatosplenomegaly. Bowel sounds positive.  Musculoskeletal: no clubbing / cyanosis. No joint deformity upper and lower extremities. Good ROM, no contractures. Normal muscle tone.  Skin: no rashes, lesions, ulcers. No induration Neurologic: CN 2-12 grossly intact. Sensation intact, DTR normal. Strength 5/5 in all 4.  Psychiatric: Normal judgment and insight. Alert and oriented x 3. Normal mood.    Labs on Admission: I have personally reviewed following labs and imaging studies  CBC:  Recent Labs Lab 03/29/17 0010  WBC 15.5*  NEUTROABS 7.1  HGB 17.1*  HCT 50.6  MCV 99.0  PLT 829   Basic Metabolic Panel:  Recent Labs Lab 03/29/17 0010  NA 138  K 3.7  CL 104  CO2 25  GLUCOSE 106*  BUN 9  CREATININE 0.94  CALCIUM 8.8*   GFR: Estimated Creatinine Clearance: 88.6 mL/min (by C-G formula based on SCr of 0.94 mg/dL). Liver Function Tests:  Recent Labs Lab 03/29/17 0010  AST 27  ALT 23  ALKPHOS 76  BILITOT 1.5*  PROT 7.4  ALBUMIN 4.0   No results for input(s): LIPASE, AMYLASE in the last 168 hours. No results for input(s): AMMONIA in the last 168 hours. Coagulation Profile: No results for input(s): INR, PROTIME in the last 168 hours. Cardiac Enzymes: No results for input(s): CKTOTAL, CKMB, CKMBINDEX, TROPONINI in the last 168 hours. BNP (last 3 results) No results for input(s): PROBNP in the last 8760 hours. HbA1C: No results for input(s): HGBA1C in the last 72 hours. CBG: No results for input(s): GLUCAP in the last 168 hours. Lipid Profile: No results for input(s): CHOL, HDL, LDLCALC, TRIG, CHOLHDL, LDLDIRECT in the last 72 hours. Thyroid Function Tests: No results for input(s): TSH, T4TOTAL, FREET4, T3FREE, THYROIDAB in the last 72 hours. Anemia  Panel: No results for input(s): VITAMINB12, FOLATE, FERRITIN, TIBC, IRON, RETICCTPCT in the last 72 hours. Urine analysis:    Component Value Date/Time   COLORURINE AMBER (A) 06/12/2015 1021   APPEARANCEUR CLEAR 06/12/2015 1021   LABSPEC 1.018 06/12/2015 1021   PHURINE 5.5 06/12/2015 1021   GLUCOSEU NEGATIVE 06/12/2015 1021   HGBUR NEGATIVE 06/12/2015 1021   BILIRUBINUR NEGATIVE 06/12/2015 1021   KETONESUR NEGATIVE 06/12/2015 1021   PROTEINUR NEGATIVE 06/12/2015 1021   UROBILINOGEN 1.0 06/12/2015 1021   NITRITE NEGATIVE 06/12/2015 1021   LEUKOCYTESUR NEGATIVE 06/12/2015 1021    Radiological Exams on Admission: Dg Chest Port 1 View  Result Date: 03/29/2017 CLINICAL DATA:  Chronic shortness of breath EXAM: PORTABLE CHEST 1 VIEW COMPARISON:  02/25/2017, 07/17/2016, 10/25/2015 FINDINGS: Hyperinflation. Vague opacity left upper lobe unchanged. No acute consolidation or effusion. Stable cardiomediastinal silhouette. No pneumothorax. IMPRESSION: Hyperinflation without focal infiltrate or edema. Electronically Signed   By: Donavan Foil M.D.   On: 03/29/2017 00:29    EKG: Independently reviewed.  Assessment/Plan Principal Problem:   Acute respiratory failure with hypoxia and hypercapnia (HCC) Active Problems:   COPD  GOLD II   Chronic respiratory failure with hypoxia (HCC)   COPD with acute exacerbation (HCC)   Essential hypertension    1. Acute resp failure with hypoxia and hypercapnea - COPD exacerbation, increased O2 requirement from baseline, hypercapnic too 1. COPD pathway 2. O2 via [place] 3. Solumedrol 60mg  IV Q12H 4. Tele monitor and continuous pulse ox 5. BIPAP PRN 6. Levaquin IV 7. Adult wheeze protocol 8. Continue COPD nebs 2. HTN - continue home meds  DVT prophylaxis: Lovenox Code Status: Full Family Communication: Wife at bedside Disposition Plan: Home after admission Consults called: None Admission status: Admit to inpatient - expect he will require multiple days  of IV steroids, breathing treatments, possibly BIPAP.   Etta Quill DO [facility] Pager [phone]  If 7AM-7PM, please contact day team taking care of patient [email or web address] Password [email or web address]  03/29/2017, 2:58 AM

## 2017-03-29 NOTE — Progress Notes (Signed)
Nutrition Brief Note  Patient identified on the Malnutrition Screening Tool (MST) Report  Wt Readings from Last 15 Encounters:  03/29/17 149 lb 14.4 oz (68 kg)  03/27/17 152 lb 12.8 oz (69.3 kg)  02/25/17 153 lb (69.4 kg)  12/10/16 156 lb (70.8 kg)  12/05/16 156 lb (70.8 kg)  10/17/16 154 lb (69.9 kg)  08/20/16 152 lb 12.8 oz (69.3 kg)  07/29/16 152 lb 3.2 oz (69 kg)  06/17/16 155 lb (70.3 kg)  04/01/16 150 lb (68 kg)  12/05/15 161 lb (73 kg)  10/25/15 155 lb (70.3 kg)  07/31/15 150 lb 6.4 oz (68.2 kg)  03/22/15 156 lb (70.8 kg)  02/20/15 155 lb (70.3 kg)    Body mass index is 20.91 kg/m. Pt UBW between 150-155 pounds, reports wt has been stable  Current diet order is Heart Healthy, patient is consuming approximately 75-100% of meals at this time. Pt reports appetite good at present and eating well at home. Discussed strategy of smaller, more frequent meals during periods of COPD exacerbation if appetite and po intake affected. Labs and medications reviewed.   No nutrition interventions warranted at this time. If nutrition issues arise, please consult RD.   Kerman Passey MS, RD, LDN [phone] Pager  [phone] Weekend/On-Call Pager

## 2017-03-30 DIAGNOSIS — I10 Essential (primary) hypertension: Secondary | ICD-10-CM

## 2017-03-30 DIAGNOSIS — Z720 Tobacco use: Secondary | ICD-10-CM

## 2017-03-30 DIAGNOSIS — J441 Chronic obstructive pulmonary disease with (acute) exacerbation: Principal | ICD-10-CM

## 2017-03-30 DIAGNOSIS — F17210 Nicotine dependence, cigarettes, uncomplicated: Secondary | ICD-10-CM | POA: Diagnosis present

## 2017-03-30 DIAGNOSIS — F1010 Alcohol abuse, uncomplicated: Secondary | ICD-10-CM

## 2017-03-30 MED ORDER — LEVOFLOXACIN 750 MG PO TABS: 750.0000 mg | ORAL_TABLET | ORAL | Status: DC

## 2017-03-30 MED ORDER — NICOTINE 21 MG/24HR TD PT24: 21.0000 mg | MEDICATED_PATCH | Freq: Every day | TRANSDERMAL | 0 refills | Status: DC

## 2017-03-30 MED ORDER — LEVOFLOXACIN 750 MG PO TABS: 750.0000 mg | ORAL_TABLET | Freq: Every day | ORAL | 0 refills | Status: AC

## 2017-03-30 MED ORDER — PREDNISONE 10 MG PO TABS: ORAL_TABLET | ORAL | 2 refills | Status: DC

## 2017-03-30 MED ORDER — PREDNISONE 20 MG PO TABS: 20.0000 mg | ORAL_TABLET | Freq: Every day | ORAL | 0 refills | Status: DC

## 2017-03-30 NOTE — Discharge Summary (Signed)
Physician Discharge Summary  Tony Sullivan OXB:[id number] DOB: 10/20/1961 DOA: 03/29/2017  PCP: [name], FNP  Admit date: 03/29/2017 Discharge date: 03/30/2017  Admitted From: Home Disposition: Home  Recommendations for Outpatient Follow-up:  1. Follow up with PCP in 1-2 weeks 2. Patient should follow-up with his pulmonologist in 4 weeks. 3. Patient will complete 5 day course of antibiotics on 6/6. He is being discharged on tapering dose of oral prednisone and then resume chronic home dose of prednisone (10 mg daily)  Home Health: None Equipment/Devices: Oxygen 2 L via nasal cannula at bedtime and on ambulation.  Discharge Condition: Stable CODE STATUS: Full code Diet recommendation: Low sodium   Discharge Diagnoses:  Principal Problem:   Acute respiratory failure with hypoxia and hypercapnia (HCC)   Active Problems:   COPD  GOLD II   COPD with acute exacerbation (HCC)   Essential hypertension   ETOH abuse   Tobacco abuse  Brief narrative/history of present illness 56 year old male with COPD on home O2 (only uses as needed, on chronic oral prednisone), ongoing heavy smoking and alcohol use presented with one-day history of shortness of breath and chest tightness. He was requiring 5 L oxygen at home Patient did not take his medications on the day of admission as he was intoxicated with alcohol. Patient was seen by his pulmonologist on 5/31 and treated for acute COPD flare. Patient was given albuterol and Atrovent nebs and 125 mg IV Solu-Medrol and out to the ED with some improvement. ABG done in the ED showed respiratory acidosis with hypercapnia. He refused BiPAP. Admitted to telemetry.  Hospital course Principal Problem:   Acute respiratory failure with hypoxia and hypercapnia (HCC) Secondary to acute COPD exacerbation. Placed on IV Solu-Medrol scheduled DuoNeb's, supportive care with antitussives. Empiric Levaquin. Clinically improved this morning. O2 sat >90% on  room air. He'll need to continue using home oxygen (at night and on ambulation)  Patient will be discharged on oral prednisone taper over the next 10 days and then switch back to his home oral prednisone (10 mg daily). Will prescribe him oral Levaquin to complete 5 day course of antibiotics. Continue home inhalers and nebulizers. -patient should follow-up with his pulmonologist in 4 weeks.   Active Problems:  Alcohol abuse. Patient informs lately has been drinking heavily citing multiple celebrations at home. Reports having some tremors on the days he doesn't drink. They seem on CIWA. No signs of withdrawal. Counseled strongly on cessation.   Essential hypertension amlodipine.  Ongoing tobacco use Counseled strongly on suggestions. Prescribe nicotine patch.    Code Status : Full code  Family Communication  :Wife at bedside  Disposition Plan  :Home     Consults  : None  Procedures: None  Discharge Instructions   Allergies as of 03/30/2017      Reactions   Penicillins Anaphylaxis   Has patient had a PCN reaction causing immediate rash, facial/tongue/throat swelling, SOB or lightheadedness with hypotension: Yes Has patient had a PCN reaction causing severe rash involving mucus membranes or skin necrosis: No Has patient had a PCN reaction that required hospitalization No Has patient had a PCN reaction occurring within the last 10 years: No If all of the above answers are "NO", then may proceed with Cephalosporin use.   Sulfa Antibiotics Swelling   Swelling of hands and throat    Famotidine Other (See Comments)   Bloating of stomach, loss of appetite   Levocetirizine Other (See Comments)   Muscle cramps  Medication List    TAKE these medications   Aclidinium Bromide 400 MCG/ACT Aepb Commonly known as:  TUDORZA PRESSAIR Inhale 1 puff into the lungs 2 (two) times daily.   albuterol (2.5 MG/3ML) 0.083% nebulizer solution Commonly known as:   PROVENTIL Take 2.5 mg by nebulization every 4 (four) hours as needed for wheezing or shortness of breath. What changed:  Another medication with the same name was changed. Make sure you understand how and when to take each.   albuterol 108 (90 Base) MCG/ACT inhaler Commonly known as:  PROAIR HFA 2 puffs up to every 4 hours if needed for short of breath What changed:  how much to take  how to take this  when to take this  reasons to take this  additional instructions   amLODipine 10 MG tablet Commonly known as:  NORVASC Take 10 mg by mouth every morning.   benztropine 0.5 MG tablet Commonly known as:  COGENTIN Take 0.5 mg by mouth at bedtime.   budesonide-formoterol 160-4.5 MCG/ACT inhaler Commonly known as:  SYMBICORT INHALE TWO PUFFS INTO THE LUNGS TWO (TWO) TIMES DAILY.   cetirizine 10 MG tablet Commonly known as:  ZYRTEC Take 10 mg by mouth daily.   fluticasone 50 MCG/ACT nasal spray Commonly known as:  FLONASE PLACE TWO SPRAYS INTO BOTH NOSTRILS THREE TIMES DAILY AS NEEDED FOR ALLERGIES.   FLUTTER Devi Use as directed   levofloxacin 750 MG tablet Commonly known as:  LEVAQUIN Take 1 tablet (750 mg total) by mouth daily. Start taking on:  03/31/2017   LISINOPRIL PO Take 1 tablet by mouth daily.   loxapine 10 MG capsule Commonly known as:  LOXITANE Take 10 mg by mouth at bedtime.   mirtazapine 30 MG tablet Commonly known as:  REMERON Take 30 mg by mouth at bedtime.   multivitamin capsule Take 1 capsule by mouth daily.   nicotine 21 mg/24hr patch Commonly known as:  NICODERM CQ - dosed in mg/24 hours Place 1 patch (21 mg total) onto the skin daily. Start taking on:  03/31/2017   omeprazole 20 MG capsule Commonly known as:  PRILOSEC TAKE 1 CAPSULE (20 MG TOTAL) BY MOUTH DAILY.   OXYGEN Inhale 2-3 L into the lungs as directed. 2-3 liters with sleep and exertion as needed for low oxygen   predniSONE 20 MG tablet Commonly known as:  DELTASONE Take 1  tablet (20 mg total) by mouth daily with breakfast. What changed:  You were already taking a medication with the same name, and this prescription was added. Make sure you understand how and when to take each.   predniSONE 10 MG tablet Commonly known as:  DELTASONE Take  2 daily until better then 1 daily Start taking on:  04/08/2017 What changed:  These instructions start on 04/08/2017. If you are unsure what to do until then, ask your doctor or other care provider.   risperiDONE 2 MG tablet Commonly known as:  RISPERDAL Take 2 mg by mouth at bedtime.   traMADol 50 MG tablet Commonly known as:  ULTRAM Take 100 mg by mouth every 6 (six) hours as needed for moderate pain.      Follow-up Information    [name], FNP. Schedule an appointment as soon as possible for a visit in 1 week(s).   Specialty:  Nurse Practitioner Contact information: [place] [address] [phone]        [name], MD. Schedule an appointment as soon  as possible for a visit in 4 week(s).   Specialty:  Pulmonary Disease Contact information: [address] [place] [address] [phone]          Allergies  Allergen Reactions  . Penicillins Anaphylaxis    Has patient had a PCN reaction causing immediate rash, facial/tongue/throat swelling, SOB or lightheadedness with hypotension: Yes Has patient had a PCN reaction causing severe rash involving mucus membranes or skin necrosis: No Has patient had a PCN reaction that required hospitalization No Has patient had a PCN reaction occurring within the last 10 years: No If all of the above answers are "NO", then may proceed with Cephalosporin use.   . Sulfa Antibiotics Swelling    Swelling of hands and throat   . Famotidine Other (See Comments)    Bloating of stomach, loss of appetite  . Levocetirizine Other (See Comments)    Muscle cramps     Procedures/Studies: Dg Chest Port 1 View  Result Date:  03/29/2017 CLINICAL DATA:  Chronic shortness of breath EXAM: PORTABLE CHEST 1 VIEW COMPARISON:  02/25/2017, 07/17/2016, 10/25/2015 FINDINGS: Hyperinflation. Vague opacity left upper lobe unchanged. No acute consolidation or effusion. Stable cardiomediastinal silhouette. No pneumothorax. IMPRESSION: Hyperinflation without focal infiltrate or edema. Electronically Signed   By: Donavan Foil M.D.   On: 03/29/2017 00:29     Subjective: Informs his breathing to be much improved and back to baseline.  Discharge Exam: Vitals:   03/30/17 0800 03/30/17 0914  BP: 116/84 116/84  Pulse: 70   Resp: 20   Temp: 97.8 F (36.6 C)    Vitals:   03/30/17 0505 03/30/17 0800 03/30/17 0856 03/30/17 0914  BP: 112/78 116/84  116/84  Pulse: 75 70    Resp: 20 20    Temp:  97.8 F (36.6 C)    TempSrc:  Oral    SpO2: 98% 95% 93%   Weight:      Height:        General:Middle aged male not in distress  HEENT: Moist because of, supple neck Chest: Clear to auscultation bilaterally, no added sounds CVS: Normal S1 and S2, no murmurs or gallop GI: Soft, nondistended, nontender Musculoskeletal: Warm, no edema CNS: Alert and oriented, no tremors    The results of significant diagnostics from this hospitalization (including imaging, microbiology, ancillary and laboratory) are listed below for reference.     Microbiology: Recent Results (from the past 240 hour(s))  MRSA PCR Screening     Status: None   Collection Time: 03/29/17  4:19 AM  Result Value Ref Range Status   MRSA by PCR NEGATIVE NEGATIVE Final    Comment:        The GeneXpert MRSA Assay (FDA approved for NASAL specimens only), is one component of a comprehensive MRSA colonization surveillance program. It is not intended to diagnose MRSA infection nor to guide or monitor treatment for MRSA infections.      Labs: BNP (last 3 results)  Recent Labs  06/17/16 0844  BNP 10.9   Basic Metabolic Panel:  Recent Labs Lab 03/29/17 0010   NA 138  K 3.7  CL 104  CO2 25  GLUCOSE 106*  BUN 9  CREATININE 0.94  CALCIUM 8.8*   Liver Function Tests:  Recent Labs Lab 03/29/17 0010  AST 27  ALT 23  ALKPHOS 76  BILITOT 1.5*  PROT 7.4  ALBUMIN 4.0   No results for input(s): LIPASE, AMYLASE in the last 168 hours. No results for input(s): AMMONIA in the  last 168 hours. CBC:  Recent Labs Lab 03/29/17 0010  WBC 15.5*  NEUTROABS 7.1  HGB 17.1*  HCT 50.6  MCV 99.0  PLT 237   Cardiac Enzymes: No results for input(s): CKTOTAL, CKMB, CKMBINDEX, TROPONINI in the last 168 hours. BNP: Invalid input(s): POCBNP CBG: No results for input(s): GLUCAP in the last 168 hours. D-Dimer No results for input(s): DDIMER in the last 72 hours. Hgb A1c No results for input(s): HGBA1C in the last 72 hours. Lipid Profile No results for input(s): CHOL, HDL, LDLCALC, TRIG, CHOLHDL, LDLDIRECT in the last 72 hours. Thyroid function studies No results for input(s): TSH, T4TOTAL, T3FREE, THYROIDAB in the last 72 hours.  Invalid input(s): FREET3 Anemia work up No results for input(s): VITAMINB12, FOLATE, FERRITIN, TIBC, IRON, RETICCTPCT in the last 72 hours. Urinalysis    Component Value Date/Time   COLORURINE AMBER (A) 06/12/2015 1021   APPEARANCEUR CLEAR 06/12/2015 1021   LABSPEC 1.018 06/12/2015 1021   PHURINE 5.5 06/12/2015 1021   GLUCOSEU NEGATIVE 06/12/2015 1021   HGBUR NEGATIVE 06/12/2015 1021   BILIRUBINUR NEGATIVE 06/12/2015 1021   KETONESUR NEGATIVE 06/12/2015 1021   PROTEINUR NEGATIVE 06/12/2015 1021   UROBILINOGEN 1.0 06/12/2015 1021   NITRITE NEGATIVE 06/12/2015 1021   LEUKOCYTESUR NEGATIVE 06/12/2015 1021   Sepsis Labs Invalid input(s): PROCALCITONIN,  WBC,  LACTICIDVEN Microbiology Recent Results (from the past 240 hour(s))  MRSA PCR Screening     Status: None   Collection Time: 03/29/17  4:19 AM  Result Value Ref Range Status   MRSA by PCR NEGATIVE NEGATIVE Final    Comment:        The GeneXpert MRSA  Assay (FDA approved for NASAL specimens only), is one component of a comprehensive MRSA colonization surveillance program. It is not intended to diagnose MRSA infection nor to guide or monitor treatment for MRSA infections.      Time coordinating discharge: Over 30 minutes  SIGNED:   Louellen Molder, MD  [facility] 03/30/2017, 9:50 AM Pager   If 7PM-7AM, please contact night-coverage [email or web address] Password [email or web address]

## 2017-03-30 NOTE — Discharge Instructions (Signed)
Chronic Obstructive Pulmonary Disease Exacerbation Chronic obstructive pulmonary disease (COPD) is a common lung problem. In COPD, the flow of air from the lungs is limited. COPD exacerbations are times that breathing gets worse and you need extra treatment. Without treatment they can be life threatening. If they happen often, your lungs can become more damaged. If your COPD gets worse, your doctor may treat you with:  Medicines.  Oxygen.  Different ways to clear your airway, such as using a mask.  Follow these instructions at home:  Do not smoke.  Avoid tobacco smoke and other things that bother your lungs.  If given, take your antibiotic medicine as told. Finish the medicine even if you start to feel better.  Only take medicines as told by your doctor.  Drink enough fluids to keep your pee (urine) clear or pale yellow (unless your doctor has told you not to).  Use a cool mist machine (vaporizer).  If you use oxygen or a machine that turns liquid medicine into a mist (nebulizer), continue to use them as told.  Keep up with shots (vaccinations) as told by your doctor.  Exercise regularly.  Eat healthy foods.  Keep all doctor visits as told. Get help right away if:  You are very short of breath and it gets worse.  You have trouble talking.  You have bad chest pain.  You have blood in your spit (sputum).  You have a fever.  You keep throwing up (vomiting).  You feel weak, or you pass out (faint).  You feel confused.  You keep getting worse. This information is not intended to replace advice given to you by your health care provider. Make sure you discuss any questions you have with your health care provider. Document Released: 10/03/2011 Document Revised: 03/21/2016 Document Reviewed: 06/18/2013 Elsevier Interactive Patient Education  2017 [place].   Tobacco Use Disorder Tobacco use disorder (TUD) is a mental disorder. It is the long-term use of tobacco in  spite of related health problems or difficulty with normal life activities. Tobacco is most commonly smoked as cigarettes and less commonly as cigars or pipes. Smokeless chewing tobacco and snuff are also popular. People with TUD get a feeling of extreme pleasure (euphoria) from using tobacco and have a desire to use it again and again. Repeated use of tobacco can cause problems. The addictive effects of tobacco are due mainly tothe ingredient nicotine. Nicotine also causes a rush of adrenaline (epinephrine) in the body. This leads to increased blood pressure, heart rate, and breathing rate. These changes may cause problems for people with high blood pressure, weak hearts, or lung disease. High doses of nicotine in children and pets can lead to seizures and death. Tobacco contains a number of other unsafe chemicals. These chemicals are especially harmful when inhaled as smoke and can damage almost every organ in the body. Smokers live shorter lives than nonsmokers and are at risk of dying from a number of diseases and cancers. Tobacco smoke can also cause health problems for nonsmokers (due to inhaling secondhand smoke). Smoking is also a fire hazard. TUD usually starts in the late teenage years and is most common in young adults between the ages of 54 and 48 years. People who start smoking earlier in life are more likely to continue smoking as adults. TUD is somewhat more common in men than women. People with TUD are at higher risk for using alcohol and other drugs of abuse. What increases the risk? Risk factors for TUD  include:  Having family members with the disorder.  Being around people who use tobacco.  Having an existing mental health issue such as schizophrenia, depression, bipolar disorder, ADHD, or posttraumatic stress disorder (PTSD).  What are the signs or symptoms? People with tobacco use disorder have two or more of the following signs and symptoms within 12 months:  Use of more  tobacco over a longer period than intended.  Not able to cut down or control tobacco use.  A lot of time spent obtaining or using tobacco.  Strong desire or urge to use tobacco (craving). Cravings may last for 6 months or longer after quitting.  Use of tobacco even when use leads to major problems at work, school, or home.  Use of tobacco even when use leads to relationship problems.  Giving up or cutting down on important life activities because of tobacco use.  Repeatedly using tobacco in situations where it puts you or others in physical danger, like smoking in bed.  Use of tobacco even when it is known that a physical or mental problem is likely related to tobacco use. ? Physical problems are numerous and may include chronic bronchitis, emphysema, lung and other cancers, gum disease, high blood pressure, heart disease, and stroke. ? Mental problems caused by tobacco may include difficulty sleeping and anxiety.  Need to use greater amounts of tobacco to get the same effect. This means you have developed a tolerance.  Withdrawal symptoms as a result of stopping or rapidly cutting back use. These symptoms may last a month or more after quitting and include the following: ? Depressed, anxious, or irritable mood. ? Difficulty concentrating. ? Increased appetite. ? Restlessness or trouble sleeping. ? Use of tobacco to avoid withdrawal symptoms.  How is this diagnosed? Tobacco use disorder is diagnosed by your health care provider. A diagnosis may be made by:  Your health care provider asking questions about your tobacco use and any problems it may be causing.  A physical exam.  Lab tests.  You may be referred to a mental health professional or addiction specialist.  The severity of tobacco use disorder depends on the number of signs and symptoms you have:  Mild--Two or three symptoms.  Moderate--Four or five symptoms.  Severe--Six or more symptoms.  How is this  treated? Many people with tobacco use disorder are unable to quit on their own and need help. Treatment options include the following:  Nicotine replacement therapy (NRT). NRT provides nicotine without the other harmful chemicals in tobacco. NRT gradually lowers the dosage of nicotine in the body and reduces withdrawal symptoms. NRT is available in over-the-counter forms (gum, lozenges, and skin patches) as well as prescription forms (mouth inhaler and nasal spray).  Medicines.This may include: ? Antidepressant medicine that may reduce nicotine cravings. ? A medicine that acts on nicotine receptors in the brain to reduce cravings and withdrawal symptoms. It may also block the effects of tobacco in people with TUD who relapse.  Counseling or talk therapy. A form of talk therapy called behavioral therapy is commonly used to treat people with TUD. Behavioral therapy looks at triggers for tobacco use, how to avoid them, and how to cope with cravings. It is most effective in person or by phone but is also available in self-help forms (books and Internet websites).  Support groups. These provide emotional support, advice, and guidance for quitting tobacco.  The most effective treatment for TUD is usually a combination of medicine, talk therapy, and support groups.  Follow these instructions at home:  Keep all follow-up visits as directed by your health care provider. This is important.  Take medicines only as directed by your health care provider.  Check with your health care provider before starting new prescription or over-the-counter medicines. Contact a health care provider if:  You are not able to take your medicines as prescribed.  Treatment is not helping your TUD and your symptoms get worse. Get help right away if:  You have serious thoughts about hurting yourself or others.  You have trouble breathing, chest pain, sudden weakness, or sudden numbness in part of your body. This  information is not intended to replace advice given to you by your health care provider. Make sure you discuss any questions you have with your health care provider. Document Released: 06/19/2004 Document Revised: 06/16/2016 Document Reviewed: 12/10/2013    Alcohol Use Disorder Alcohol use disorder is when your drinking disrupts your daily life. When you have this condition, you drink too much alcohol and you cannot control your drinking. Alcohol use disorder can cause serious problems with your physical health. It can affect your brain, heart, liver, pancreas, immune system, stomach, and intestines. Alcohol use disorder can increase your risk for certain cancers and cause problems with your mental health, such as depression, anxiety, psychosis, delirium, and dementia. People with this disorder risk hurting themselves and others. What are the causes? This condition is caused by drinking too much alcohol over time. It is not caused by drinking too much alcohol only one or two times. Some people with this condition drink alcohol to cope with or escape from negative life events. Others drink to relieve pain or symptoms of mental illness. What increases the risk? You are more likely to develop this condition if:  You have a family history of alcohol use disorder.  Your culture encourages drinking to the point of intoxication, or makes alcohol easy to get.  You had a mood or conduct disorder in childhood.  You have been a victim of abuse.  You are an adolescent and: ? You have poor grades or difficulties in school. ? Your caregivers do not talk to you about saying no to alcohol, or supervise your activities. ? You are impulsive or you have trouble with self-control.  What are the signs or symptoms? Symptoms of this condition include:  Drinkingmore than you want to.  Drinking for longer than you want to.  Trying several times to drink less or to control your drinking.  Spending a lot of  time getting alcohol, drinking, or recovering from drinking.  Craving alcohol.  Having problems at work, at school, or at home due to drinking.  Having problems in relationships due to drinking.  Drinking when it is dangerous to drink, such as before driving a car.  Continuing to drink even though you know you might have a physical or mental problem related to drinking.  Needing more and more alcohol to get the same effect you want from the alcohol (building up tolerance).  Having symptoms of withdrawal when you stop drinking. Symptoms of withdrawal include: ? Fatigue. ? Nightmares. ? Trouble sleeping. ? Depression. ? Anxiety. ? Fever. ? Seizures. ? Severe confusion. ? Feeling or seeing things that are not there (hallucinations). ? Tremors. ? Rapid heart rate. ? Rapid breathing. ? High blood pressure.  Drinking to avoid symptoms of withdrawal.  How is this diagnosed? This condition is diagnosed with an assessment. Your health care provider may start the assessment by  asking three or four questions about your drinking. Your health care provider may perform a physical exam or do lab tests to see if you have physical problems resulting from alcohol use. She or he may refer you to a mental health professional for evaluation. How is this treated? Some people with alcohol use disorder are able to reduce their alcohol use to low-risk levels. Others need to completely quit drinking alcohol. When necessary, mental health professionals with specialized training in substance use treatment can help. Your health care provider can help you decide how severe your alcohol use disorder is and what type of treatment you need. The following forms of treatment are available:  Detoxification. Detoxification involves quitting drinking and using prescription medicines within the first week to help lessen withdrawal symptoms. This treatment is important for people who have had withdrawal symptoms before  and for heavy drinkers who are likely to have withdrawal symptoms. Alcohol withdrawal can be dangerous, and in severe cases, it can cause death. Detoxification may be provided in a home, community, or primary care setting, or in a hospital or substance use treatment facility.  Counseling. This treatment is also called talk therapy. It is provided by substance use treatment counselors. A counselor can address the reasons you use alcohol and suggest ways to keep you from drinking again or to prevent problem drinking. The goals of talk therapy are to: ? Find healthy activities and ways for you to cope with stress. ? Identify and avoid the things that trigger your alcohol use. ? Help you learn how to handle cravings.  Medicines.Medicines can help treat alcohol use disorder by: ? Decreasing alcohol cravings. ? Decreasing the positive feeling you have when you drink alcohol. ? Causing an uncomfortable physical reaction when you drink alcohol (aversion therapy).  Support groups. Support groups are led by people who have quit drinking. They provide emotional support, advice, and guidance.  These forms of treatment are often combined. Some people with this condition benefit from a combination of treatments provided by specialized substance use treatment centers. Follow these instructions at home:  Take over-the-counter and prescription medicines only as told by your health care provider.  Check with your health care provider before starting any new medicines.  Ask friends and family members not to offer you alcohol.  Avoid situations where alcohol is served, including gatherings where others are drinking alcohol.  Create a plan for what to do when you are tempted to use alcohol.  Find hobbies or activities that you enjoy that do not include alcohol.  Keep all follow-up visits as told by your health care provider. This is important. How is this prevented?  If you drink, limit alcohol intake to  no more than 1 drink a day for nonpregnant women and 2 drinks a day for men. One drink equals 12 oz of beer, 5 oz of wine, or 1 oz of hard liquor.  If you have a mental health condition, get treatment and support.  Do not give alcohol to adolescents.  If you are an adolescent: ? Do not drink alcohol. ? Do not be afraid to say no if someone offers you alcohol. Speak up about why you do not want to drink. You can be a positive role model for your friends and set a good example for those around you by not drinking alcohol. ? If your friends drink, spend time with others who do not drink alcohol. Make new friends who do not use alcohol. ? Find healthy ways  to manage stress and emotions, such as meditation or deep breathing, exercise, spending time in nature, listening to music, or talking with a trusted friend or family member. Contact a health care provider if:  You are not able to take your medicines as told.  Your symptoms get worse.  You return to drinking alcohol (relapse) and your symptoms get worse. Get help right away if:  You have thoughts about hurting yourself or others. If you ever feel like you may hurt yourself or others, or have thoughts about taking your own life, get help right away. You can go to your nearest emergency department or call:  Your local emergency services (911 in the [place]  A suicide crisis helpline, such as the [place] at 6180232978. This is open 24 hours a day.  Summary  Alcohol use disorder is when your drinking disrupts your daily life. When you have this condition, you drink too much alcohol and you cannot control your drinking.  Treatment may include detoxification, counseling, medicine, and support groups.  Ask friends and family members not to offer you alcohol. Avoid situations where alcohol is served.  Get help right away if you have thoughts about hurting yourself or others. This information is not intended  to replace advice given to you by your health care provider. Make sure you discuss any questions you have with your health care provider. Document Released: 11/21/2004 Document Revised: 07/11/2016 Document Reviewed: 07/11/2016 Elsevier Interactive Patient Education  2018 [employer].  Chartered certified [employer] Patient Education  [employer].

## 2017-05-13 ENCOUNTER — Ambulatory Visit: Payer: Medicaid Other | Admitting: Internal Medicine

## 2017-06-02 ENCOUNTER — Ambulatory Visit: Payer: Medicaid Other | Admitting: Internal Medicine

## 2017-06-03 ENCOUNTER — Telehealth: Payer: Self-pay | Admitting: Internal Medicine

## 2017-06-03 NOTE — Telephone Encounter (Signed)
Tony Sullivan with [employer] calling back about Tony Sullivan [phone] ext [phone]

## 2017-06-03 NOTE — Telephone Encounter (Signed)
I have spoken with Tony Sullivan with [employer], who states pt needs to be re qualified for oxygen at his OV with Dr. [name] on 06/05/17. Apt note has been updated to walk pt during his OV. Nothing further needed.

## 2017-06-03 NOTE — Telephone Encounter (Addendum)
Lmtcb x1 for [place] with [employer].   I have added an apt note for 06/05/17 to walk pt.

## 2017-06-04 NOTE — Telephone Encounter (Signed)
Error

## 2017-06-05 ENCOUNTER — Ambulatory Visit (INDEPENDENT_AMBULATORY_CARE_PROVIDER_SITE_OTHER): Payer: Medicaid Other | Admitting: Internal Medicine

## 2017-06-05 ENCOUNTER — Encounter: Payer: Self-pay | Admitting: Internal Medicine

## 2017-06-05 VITALS — BP 108/68 | HR 86 | Ht 71.0 in | Wt 153.0 lb

## 2017-06-05 DIAGNOSIS — F17210 Nicotine dependence, cigarettes, uncomplicated: Secondary | ICD-10-CM | POA: Diagnosis not present

## 2017-06-05 DIAGNOSIS — J9611 Chronic respiratory failure with hypoxia: Secondary | ICD-10-CM

## 2017-06-05 DIAGNOSIS — J449 Chronic obstructive pulmonary disease, unspecified: Secondary | ICD-10-CM | POA: Diagnosis not present

## 2017-06-05 MED ORDER — TIOTROPIUM BROMIDE MONOHYDRATE 2.5 MCG/ACT IN AERS: 2.0000 | INHALATION_SPRAY | Freq: Every day | RESPIRATORY_TRACT | 0 refills | Status: DC

## 2017-06-05 NOTE — Progress Notes (Signed)
Subjective:   Patient ID: Tony Sullivan, male    DOB: Mar 16, 1961  MRN: [id number]    Brief patient profile:  56  yobm quit smoking 03/17/16 with breathing difficulties since 2004 dx as copd on advair 250 /50 but getting worse since 2014 and placed on 02 for the first time in March 2015 and referred 01/07/2014 to pulmonary clinic by Dr Kennon Holter with GOLD II copd criteria established 02/14/14    History of Present Illness  01/07/2014 1st [facility] office visit/ [facility]  Chief Complaint  Patient presents with  . Advice Only    Referred for COPD, SOB.  Pt c/o SOB, low 02, prod cough with white mucous.  Pt forgot med list, unsure of what all he is taking  still can do a [facility] but uses [facility] parking, assoc with congested coughing esp in am worse in winter. rec Stop advair   Plan A =  Automatic =  symbicort 160 Take 2 puffs first thing in am and then another 2 puffs about 12 hours later. spiriva in am only  Plan B = Backup- Proventil use it only if you can't your breath - ok to use up to every 4 hours if needed Plan C = Nebulizer up to every 4 hours if needed only if you try plan B first and it doesn't work     02/25/2017  f/u ov/[facility] re:   GOLD II/ quit smoking as of 11/2016 on sym 160 2bid/ tudorza and 02 2lpm  Chief Complaint  Patient presents with  . Follow-up    Breathing has been worse. He lost his albuterol inhaler. He has been using albuterol neb every am.  He feels that his breathing is worse first thing in the am. He has to sleep propped up due to SOB when he lies down flat.   confused with instructions/ still dep on neb and off prednisone / no med calendar  Does respond to prednisone each time he takes a course with less sob/less saba need  rec Prednisone 10 mg x 2 daily until better nad not having to use your nebulizer each am then reduce to 10 mg daily     NP  03/27/17 ov  Continue on Symbicort 2 puffs Twice daily  , rinse after use.  Continue on Tudorza 1 puff Twice  daily  , rinse after use.  Follow med calendar closely and bring to each visit.  Follow up Dr. [name]  In  6-8 weeks and As needed    Admit date: 03/29/2017 Discharge date: 03/30/2017  Discharge Diagnoses:  Principal Problem:   Acute respiratory failure with hypoxia and hypercapnia ([place])  Active Problems:   COPD  GOLD II   COPD with acute exacerbation ([place])   Essential hypertension   ETOH abuse   Tobacco abuse  Brief narrative/history of present illness 56 year old male with COPD on home O2 (only uses as needed, on chronic oral prednisone), ongoing heavy smoking and alcohol use presented with one-day history of shortness of breath and chest tightness. He was requiring 5 L oxygen at home Patient did not take his medications on the day of admission as he was intoxicated with alcohol. Patient was seen by his pulmonologist on 5/31 and treated for acute COPD flare. Patient was given albuterol and Atrovent nebs and 125 mg IV Solu-Medrol and out to the ED with some improvement. ABG done in the ED showed respiratory acidosis with hypercapnia. He refused BiPAP. Admitted to telemetry.  Hospital course  Principal Problem: Acute respiratory failure with hypoxia and hypercapnia ([facility]) Secondary to acute COPD exacerbation. Placed on IV Solu-Medrol scheduled DuoNeb's, supportive care with antitussives. Empiric Levaquin. Clinically improved this morning. O2 sat >90% on room air. He'll need to continue using home oxygen (at night and on ambulation) Patient will be discharged on oral prednisone taper over the next 10 days and then switch back to his home oral prednisone (10 mg daily). Will prescribe him oral Levaquin to complete 5 day course of antibiotics. Continue home inhalers and nebulizers. -patient should follow-up with his pulmonologist in 4 weeks.   Active Problems:  Alcohol abuse. Patient informs lately has been drinking heavily citing multiple celebrations at home. Reports having some  tremors on the days he doesn't drink. They seem on CIWA. No signs of withdrawal. Counseled strongly on cessation.   Essential hypertension amlodipine.  Ongoing tobacco use Counseled strongly on suggestions. Prescribe nicotine patch.      06/05/2017  f/u ov/[facility] re:  Transition of care/ post hosp ov - no med calendar/ still on 20 mg prednisone  Supposed to be on 24h 02 but not using this way  Chief Complaint  Patient presents with  . Follow-up    Increased cough x 3-4 days- prod with minimal clear sputum. He has also noticed some increased SOB and lack of energy.  He is using his albuterol inhaler 2-3 x per day and neb with albuterol 2 x daily on average.   never got to point where was not over using saba and now gradually worse sob but still comfortable at rest Not clear he's using his action plan on med calendar   No obvious day to day or daytime variability or assoc excess/ purulent sputum or mucus plugs or hemoptysis or cp or chest tightness, subjective wheeze or overt sinus or hb symptoms. No unusual exp hx or h/o childhood pna/ asthma or knowledge of premature birth.  Sleeping ok without nocturnal  or early am exacerbation  of respiratory  c/o's or need for noct saba. Also denies any obvious fluctuation of symptoms with weather or environmental changes or other aggravating or alleviating factors except as outlined above   Current Medications, Allergies, Complete Past Medical History, Past Surgical History, Family History, and Social History were reviewed in [employer] record.  ROS  The following are not active complaints unless bolded sore throat, dysphagia, dental problems, itching, sneezing,  nasal congestion or excess/ purulent secretions, ear ache,   fever, chills, sweats, unintended wt loss, classically pleuritic or exertional cp,  orthopnea pnd or leg swelling, presyncope, palpitations, abdominal pain, anorexia, nausea, vomiting, diarrhea  or change  in bowel or bladder habits, change in stools or urine, dysuria,hematuria,  rash, arthralgias, visual complaints, headache, numbness, weakness or ataxia or problems with walking or coordination,  change in mood/affect or memory.                Objective:   Physical Exam  Moderately hoarse anxious amb bm nad   06/15/2014  164  >  09/15/2014 158 >   02/20/2015 >156 03/22/2015 >  07/31/2015   150 > 12/05/2015  161   > 04/01/2016    151 > 07/29/2016  152 >   02/25/2017  153 >  06/05/2017  153   Vital signs reviewed   - Note on arrival 02 sats  94% RA    HEENT: nl dentition, turbinates, and oropharynx. Nl external ear canals without cough reflex   NECK :  without JVD/Nodes/TM/ nl carotid upstrokes bilaterally   LUNGS: no acc muscle use,  Barrel chest, distant bs bilaterally with mid exp wheeze/ transmitted upper airway wheeze    CV:  RRR  no s3 or murmur or increase in P2, nad no edema   ABD:  soft and nontender with pos hoover's mid insp in the supine position. No bruits or organomegaly appreciated, bowel sounds nl  MS:  Nl gait/ ext warm without deformities, calf tenderness, cyanosis or clubbing No obvious joint restrictions   SKIN: warm and dry without lesions    NEURO:  alert, approp, nl sensorium with  no motor or cerebellar deficits apparent.          I personally reviewed images and agree with radiology impression as follows:  pCXR:   03/29/17  Hyperinflation without focal infiltrate or edema.

## 2017-06-05 NOTE — Patient Instructions (Signed)
Change tudorza to spiriva respimat 2 pffs each am right after symbicort as your plan A     Plan B = Backup Only use your albuterol as a rescue medication to be used if you can't catch your breath by resting or doing a relaxed purse lip breathing pattern.  - The less you use it, the better it will work when you need it. - Ok to use the inhaler up to 2 puffs  every 4 hours if you must but call for appointment if use goes up over your usual need - Don't leave home without it !!  (think of it like the spare tire for your car)   Plan C = Crisis - only use your albuterol nebulizer if you first try Plan B and it fails to help > ok to use the nebulizer up to every 4 hours but if start needing it regularly call for immediate appointment   Plan D = Deltasone= Prednisone  - continue 10 mg x 2 daily until better and not needing so much nebulizer then 1daily thereafter   Please schedule a follow up office visit in 4 weeks, sooner if needed to See Tammy NP with all medications /inhalers/ solutions in hand so we can verify exactly what you are taking. This includes all medications from all doctors and over the counters

## 2017-06-09 NOTE — Assessment & Plan Note (Signed)
-   02/14/2014   try symbicort 160 2bid  - PFT's  02/14/2014   FEV1 1.82 (53%) and ratio 47 no better p alb (p spiriva and symbicort 160)  and DLCO 32 with 37%  07/2014 >Spiriva stopped  09/15/2014 Med calendar> not using 02/20/2015   - Declined rehab 07/31/15  - Quit smoking 11/2016  - flutter valve 04/01/2016  -med calendar 08/20/16  - added prn pred to action plan 12/05/2016    - changed to maint pred 02/25/2017 with floor of 10 mg daily >>>  - 06/05/2017  After extensive coaching device effectiveness =    90% smi > change tudorza to spiriva 2.5 x 2 - 02/14/2014   try symbicort 160 2bid  - PFT's  02/14/2014   FEV1 1.82 (53%) and ratio 47 no better p alb (p spiriva and symbicort 160)  and DLCO 32 with 37%  07/2014 >Spiriva stopped  09/15/2014 Med calendar> not using 02/20/2015   - Declined rehab 07/31/15  - Quit smoking 11/2016  - flutter valve 04/01/2016  -med calendar 08/20/16  - added prn pred to action plan 12/05/2016    - changed to maint pred 02/25/2017 with floor of 10 mg daily >>>  - 06/05/2017  After extensive coaching device effectiveness =    90% smi > change tudorza to spiriva 2.5 x 2  Active smoking, not following med calendar and high risk of readmit / ER use   I had an extended discussion with the patient reviewing all relevant studies completed to date and  lasting 15 to 20 minutes of a 25 minute visit    See above measures implemented 06/05/2017 / samples provided x 4 weeks then return with all meds in hand using a trust but verify approach to confirm accurate Medication  Reconciliation The principal here is that until we are certain that the  patients are doing what we've asked, it makes no sense to ask them to do more.   Each maintenance medication was reviewed in detail including most importantly the difference between maintenance and prns and under what circumstances the prns are to be triggered using an action plan format that is not reflected in the computer generated alphabetically  organized AVS but trather by a customized med calendar that reflects the AVS meds with confirmed 100% correlation.   In addition, Please see AVS for unique instructions that I personally wrote and verbalized to the the pt in detail and then reviewed with pt  by my nurse highlighting any  changes in therapy recommended at today's visit to their plan of care.

## 2017-06-09 NOTE — Assessment & Plan Note (Signed)
>   3 min  Matter of life or breath/ reviewed in detail - really nothing else to offer here unless he starts breathing perfectly clean air

## 2017-06-09 NOTE — Assessment & Plan Note (Signed)
-   started on 27 December 2013 by [facility] clinic - 02/14/2014  Walked RA x 3/4  laps @ 185 ft each stopped due to  desat 85% and corrected on 3lpm to x 2 laps  - 06/15/2014  Walked 2.5lpm  2 laps  @ 185 ft each stopped due to  Sob adequate sats  - 02/20/2015  Walked RA x 3 laps @ 185 ft each stopped due to  Sob/desat p 2 laps, nl pace, eliminated on his 02 at 3lpm  - 12/05/2015   Walked RA  2 laps @ 185 ft each stopped due to  desat to 88% and corrected on 2lpm   rec as of 06/05/2017 =  2lpm  Sleep and rest, none needed at rest

## 2017-07-08 ENCOUNTER — Telehealth: Payer: Self-pay | Admitting: Internal Medicine

## 2017-07-08 NOTE — Telephone Encounter (Signed)
Called and spoke with pt and he is aware of the OV note that has been faxed to [facility].  Nothing further is needed.

## 2017-07-10 ENCOUNTER — Encounter: Payer: Self-pay | Admitting: Adult Health

## 2017-07-10 ENCOUNTER — Ambulatory Visit (INDEPENDENT_AMBULATORY_CARE_PROVIDER_SITE_OTHER)
Admission: RE | Admit: 2017-07-10 | Discharge: 2017-07-10 | Disposition: A | Discharge status: Home / Self Care | Payer: Medicaid Other | Source: Ambulatory Visit | Attending: Adult Health | Admitting: Adult Health

## 2017-07-10 ENCOUNTER — Ambulatory Visit (INDEPENDENT_AMBULATORY_CARE_PROVIDER_SITE_OTHER): Payer: Medicaid Other | Admitting: Adult Health

## 2017-07-10 DIAGNOSIS — J449 Chronic obstructive pulmonary disease, unspecified: Secondary | ICD-10-CM

## 2017-07-10 DIAGNOSIS — J9611 Chronic respiratory failure with hypoxia: Secondary | ICD-10-CM

## 2017-07-10 IMAGING — DX DG CHEST 2V
2 series · 2 of 2 positions shown · non-contrast
Comparison: [DATE]

CLINICAL DATA: COPD mixed type

EXAM:
CHEST  2 VIEW

[chest pa]
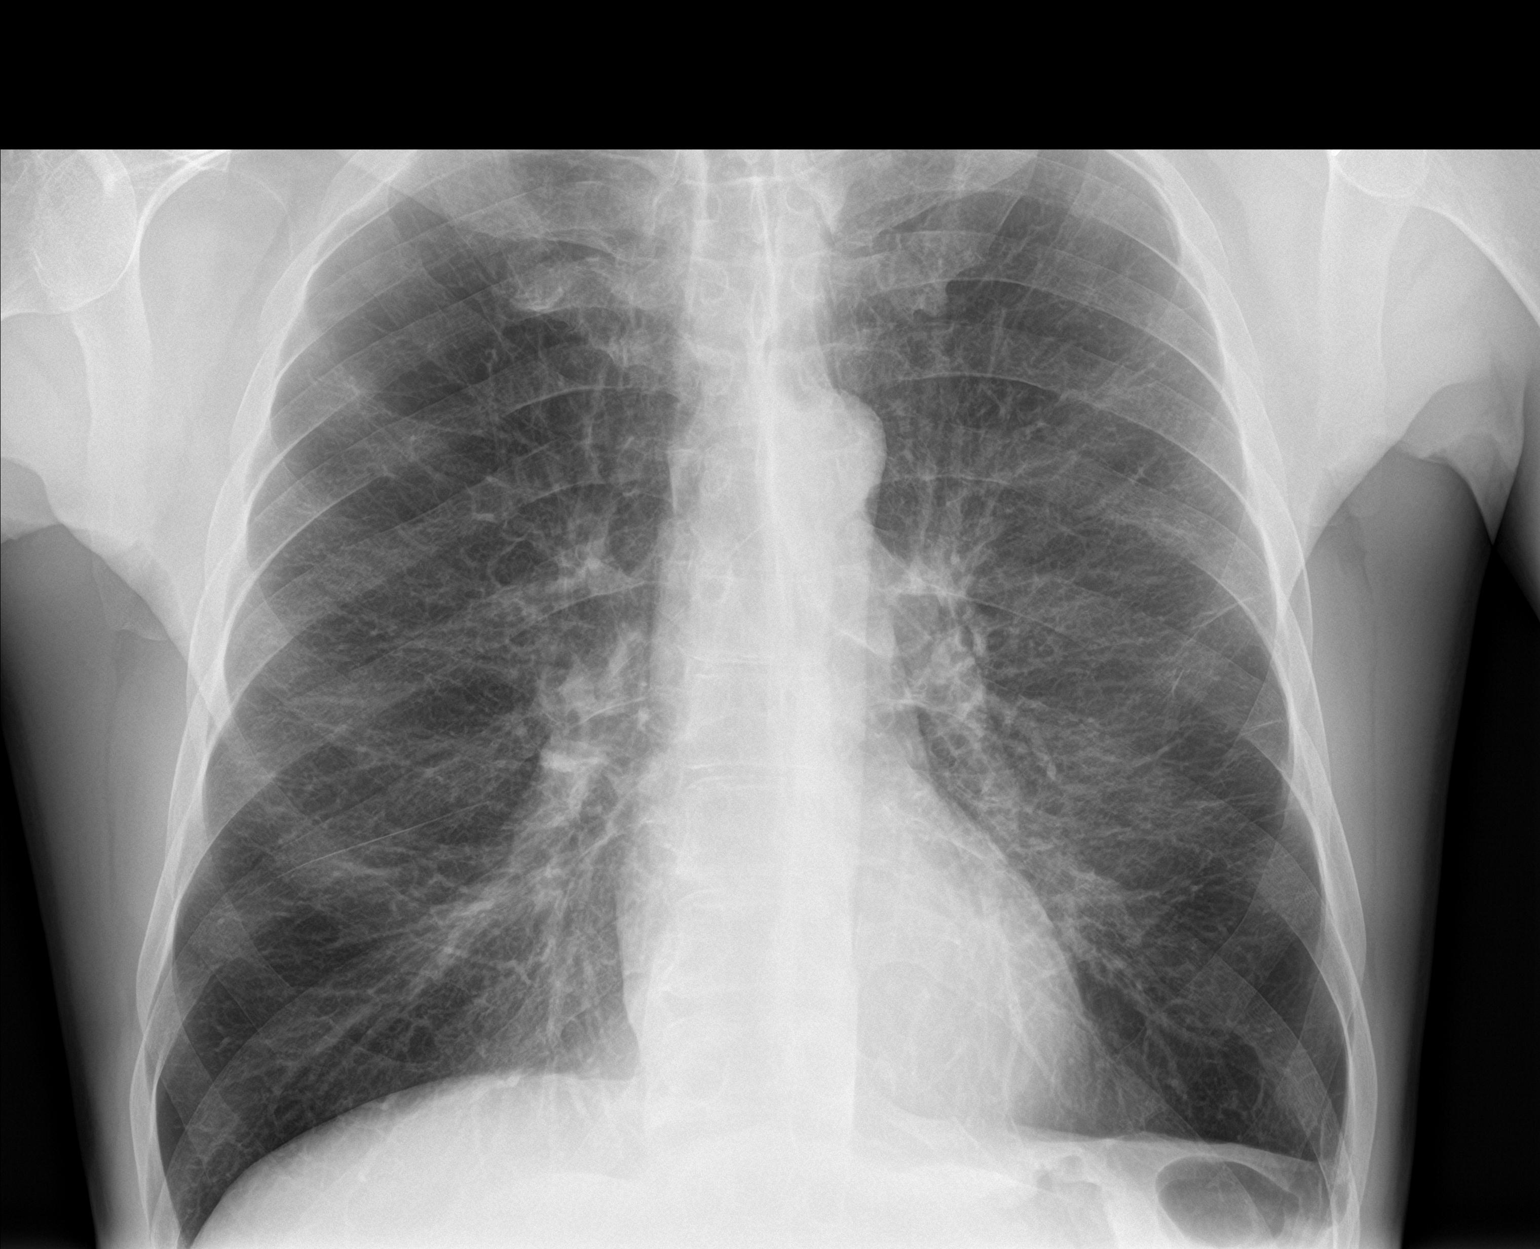

[chest lat]
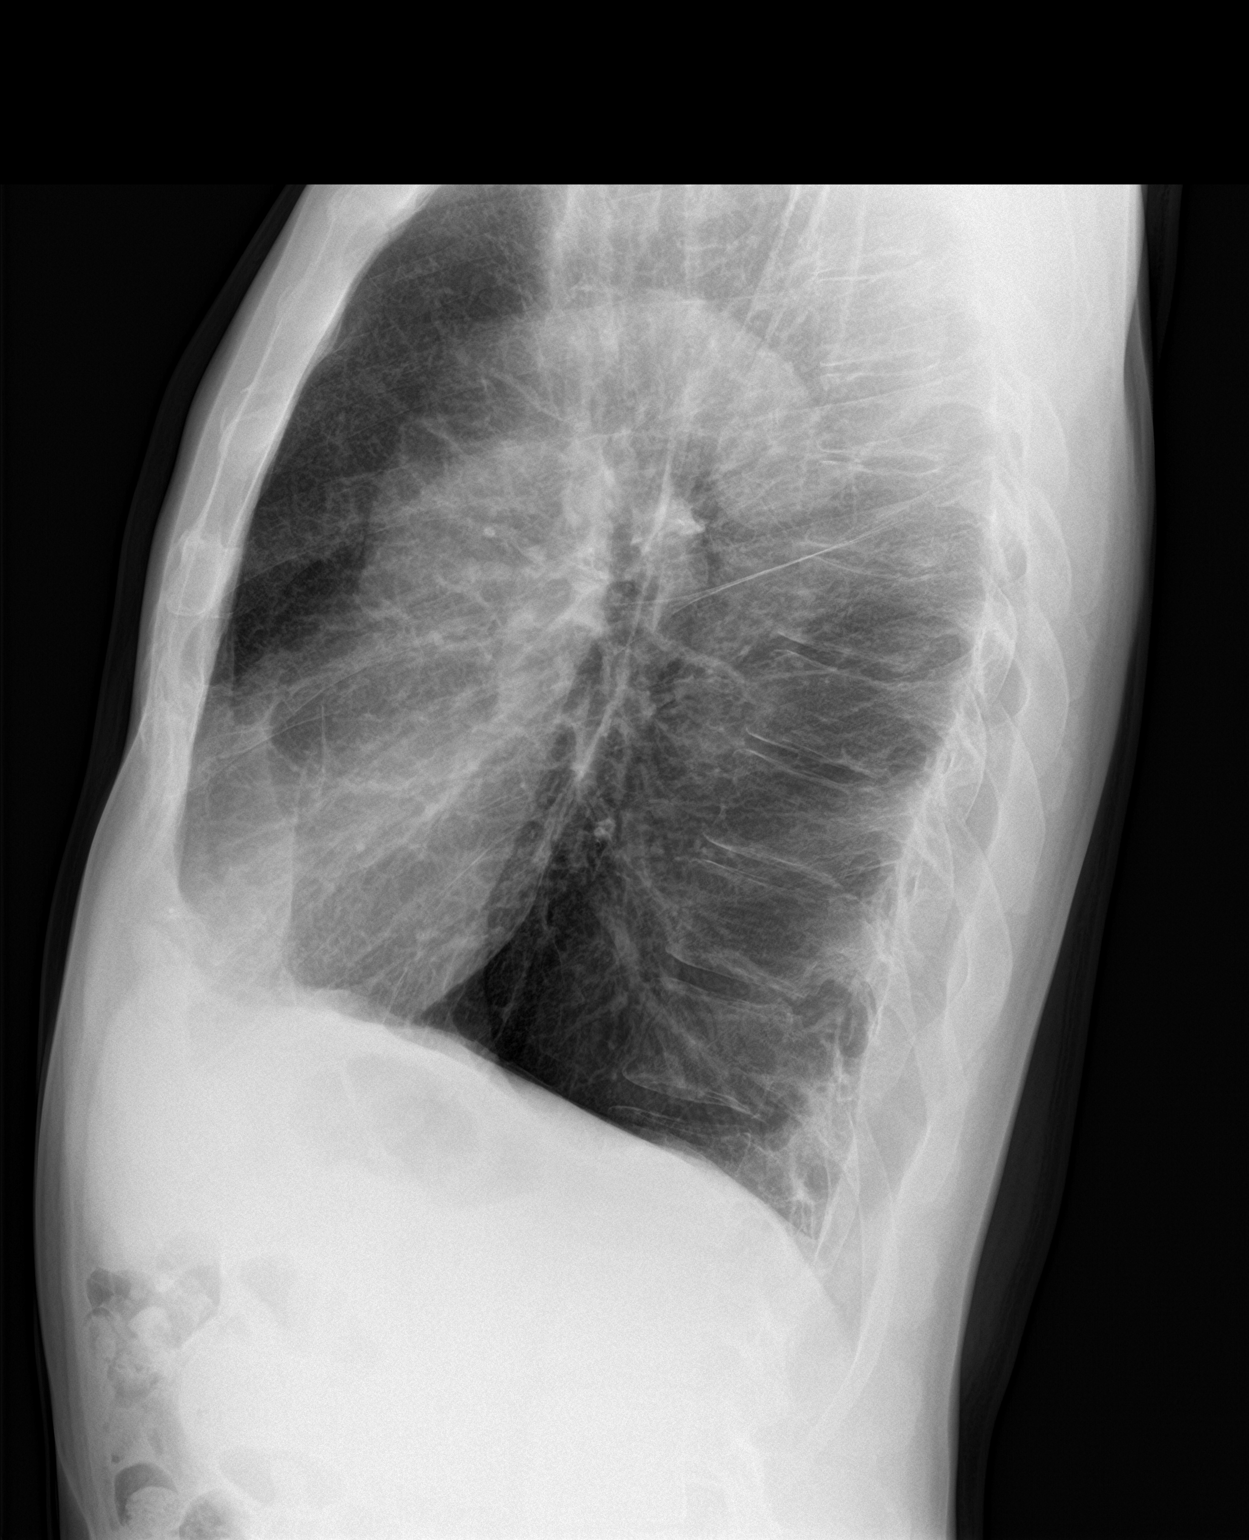

[2 of 2 positions shown; findings below may reference images not displayed]

FINDINGS: Advanced COPD and emphysema with hyperinflation and mild scarring.
Lungs are clear without infiltrate effusion or mass. Heart size and
vascularity normal.
IMPRESSION: Advanced COPD.  No acute abnormality.

## 2017-07-10 MED ORDER — TIOTROPIUM BROMIDE MONOHYDRATE 2.5 MCG/ACT IN AERS: 2.0000 | INHALATION_SPRAY | Freq: Every day | RESPIRATORY_TRACT | 0 refills | Status: DC

## 2017-07-10 MED ORDER — TIOTROPIUM BROMIDE MONOHYDRATE 2.5 MCG/ACT IN AERS: 2.0000 | INHALATION_SPRAY | Freq: Every day | RESPIRATORY_TRACT | 3 refills | Status: DC

## 2017-07-10 NOTE — Progress Notes (Signed)
Chart and office note reviewed in detail  > agree with a/p as outlined    

## 2017-07-10 NOTE — Addendum Note (Signed)
Addended by: Parke Poisson E on: 07/10/2017 04:41 PM   Modules accepted: Orders

## 2017-07-10 NOTE — Progress Notes (Signed)
@Patient  ID: [name], male    DOB: 06-18-1961, 56 y.o.   MRN: [id number]  Chief Complaint  Patient presents with  . Follow-up    COPD     Referring provider: [name], FNP  HPI: 56 year old male or smoker followed for COPD Gold 2, and oxygen dependent respiratory failure  TEST  - PFT's 02/14/2014 FEV1 1.82 (53%) and ratio 47 no better p alb (p spiriva and symbicort 160) and DLCO 32 with 37%   07/10/2017 Follow up : COPD /O2 RF  Pt returns for 1 month follow up for COPD . He says he is doing about the same with no flare cough or wheezing. Gets winded with activity. Slow going first thing in morning. He remains on prednisone 20 mg daily. Remains on Spiriva daily. And Symbicort twice daily. Remains on oxygen 2 L with activity and at bedtime.. Denies chest pain, orthopnea, PND, or increased leg swelling.   Allergies  Allergen Reactions  . Penicillins Anaphylaxis    Has patient had a PCN reaction causing immediate rash, facial/tongue/throat swelling, SOB or lightheadedness with hypotension: Yes Has patient had a PCN reaction causing severe rash involving mucus membranes or skin necrosis: No Has patient had a PCN reaction that required hospitalization No Has patient had a PCN reaction occurring within the last 10 years: No If all of the above answers are "NO", then may proceed with Cephalosporin use.   . Sulfa Antibiotics Swelling    Swelling of hands and throat   . Famotidine Other (See Comments)    Bloating of stomach, loss of appetite  . Levocetirizine Other (See Comments)    Muscle cramps    Immunization History  Administered Date(s) Administered  . Influenza Split 08/09/2013, 08/03/2014  . Influenza,inj,Quad PF,6+ Mos 07/31/2015, 07/29/2016  . Pneumococcal Conjugate-13 07/31/2015  . Pneumococcal Polysaccharide-23 08/03/2014    Past Medical History:  Diagnosis Date  . Asthma   . COPD (chronic obstructive pulmonary disease) ([place]) 2004   diagnosed  in 2004, no PFT's to date.  Started on home O2 12/2013, after found to be desatting at PCP's office, and referred to pulmonology.  . High blood pressure   . Seasonal allergies     Tobacco History: History  Smoking Status  . Former Smoker  . Packs/day: 1.00  . Years: 36.00  . Types: Cigarettes  . Quit date: 05/28/2017  Smokeless Tobacco  . Never Used   Counseling given: Not Answered   Outpatient Encounter Prescriptions as of 07/10/2017  Medication Sig  . albuterol (PROAIR HFA) 108 (90 Base) MCG/ACT inhaler 2 puffs up to every 4 hours if needed for short of breath (Patient taking differently: Inhale 2 puffs into the lungs every 4 (four) hours as needed for shortness of breath. 2 puffs up to every 4 hours if needed for short of breath)  . albuterol (PROVENTIL) (2.5 MG/3ML) 0.083% nebulizer solution Take 2.5 mg by nebulization every 4 (four) hours as needed for wheezing or shortness of breath.   Marland Kitchen amLODipine (NORVASC) 10 MG tablet Take 10 mg by mouth every morning.   . benztropine (COGENTIN) 0.5 MG tablet Take 0.5 mg by mouth at bedtime.   . budesonide-formoterol (SYMBICORT) 160-4.5 MCG/ACT inhaler INHALE TWO PUFFS INTO THE LUNGS TWO (TWO) TIMES DAILY.  . cetirizine (ZYRTEC) 10 MG tablet Take 10 mg by mouth daily.  Marland Kitchen dextromethorphan-guaiFENesin (MUCINEX DM) 30-600 MG 12hr tablet Take 1 tablet by mouth once as needed for cough.  . fluticasone (FLONASE) 50 MCG/ACT  nasal spray PLACE TWO SPRAYS INTO BOTH NOSTRILS THREE TIMES DAILY AS NEEDED FOR ALLERGIES. (Patient taking differently: PLACE TWO SPRAYS INTO BOTH NOSTRILS DAILY AS NEEDED FOR ALLERGIES.)  . losartan-hydrochlorothiazide (HYZAAR) 100-12.5 MG tablet Take 1 tablet by mouth every morning.  . loxapine (LOXITANE) 10 MG capsule Take 10 mg by mouth at bedtime.   . mirtazapine (REMERON) 30 MG tablet Take 30 mg by mouth at bedtime.  . Multiple Vitamin (MULTIVITAMIN) capsule Take 1 capsule by mouth daily.  Marland Kitchen omeprazole (PRILOSEC) 20 MG capsule  TAKE 1 CAPSULE (20 MG TOTAL) BY MOUTH DAILY. (Patient taking differently: Take 40 mg by mouth every morning. )  . OXYGEN Inhale 2 L into the lungs as directed. 2-3 liters with sleep and exertion as needed for low oxygen  . predniSONE (DELTASONE) 10 MG tablet Take  2 daily until better then 1 daily  . Respiratory Therapy Supplies (FLUTTER) DEVI Use as directed  . risperiDONE (RISPERDAL) 2 MG tablet Take 2 mg by mouth at bedtime.  . Tiotropium Bromide Monohydrate (SPIRIVA RESPIMAT) 2.5 MCG/ACT AERS Inhale 2 puffs into the lungs daily.  . traMADol (ULTRAM) 50 MG tablet Take 100 mg by mouth every 6 (six) hours as needed for moderate pain.  . [DISCONTINUED] nicotine (NICODERM CQ - DOSED IN MG/24 HOURS) 21 mg/24hr patch Place 1 patch (21 mg total) onto the skin daily. (Patient not taking: Reported on 07/10/2017)   No facility-administered encounter medications on file as of 07/10/2017.      Review of Systems  Constitutional:   No  weight loss, night sweats,  Fevers, chills, +fatigue, or  lassitude.  HEENT:   No headaches,  Difficulty swallowing,  Tooth/dental problems, or  Sore throat,                No sneezing, itching, ear ache, nasal congestion, post nasal drip,   CV:  No chest pain,  Orthopnea, PND, swelling in lower extremities, anasarca, dizziness, palpitations, syncope.   GI  No heartburn, indigestion, abdominal pain, nausea, vomiting, diarrhea, change in bowel habits, loss of appetite, bloody stools.   Resp:    No chest wall deformity  Skin: no rash or lesions.  GU: no dysuria, change in color of urine, no urgency or frequency.  No flank pain, no hematuria   MS:  No joint pain or swelling.  No decreased range of motion.  No back pain.    Physical Exam  BP 112/72 (BP Location: Left Arm, Cuff Size: Normal)   Pulse 93   Ht 5\' 11"  (1.803 m)   Wt 156 lb (70.8 kg)   SpO2 92%   BMI 21.76 kg/m   GEN: A/Ox3; pleasant , NAD, thin    HEENT:  Bee/AT,  EACs-clear, TMs-wnl,  NOSE-clear, THROAT-clear, no lesions, no postnasal drip or exudate noted.   NECK:  Supple w/ fair ROM; no JVD; normal carotid impulses w/o bruits; no thyromegaly or nodules palpated; no lymphadenopathy.    RESP  Clear  P & A; w/o, wheezes/ rales/ or rhonchi. no accessory muscle use, no dullness to percussion  CARD:  RRR, no m/r/g, no peripheral edema, pulses intact, no cyanosis or clubbing.  GI:   Soft & nt; nml bowel sounds; no organomegaly or masses detected.   Musco: Warm bil, no deformities or joint swelling noted.   Neuro: alert, no focal deficits noted.    Skin: Warm, no lesions or rashes    Lab Results:  CBC   BNP Imaging: No results found.  Assessment & Plan:   COPD  GOLD II Compensated without flare   Plan  Patient Instructions  Continue on Symbicort 2 puffs Twice daily  , rinse after use.  Continue on Spiriva daily  , rinse after use.  Chest xray today  Follow up Dr. [name]  In 3 months and As needed   Please contact office for sooner follow up if symptoms do not improve or worsen or seek emergency care      Chronic respiratory failure with hypoxia ([place]) Cont on o2      Aileene Lanum, NP 07/10/2017

## 2017-07-10 NOTE — Addendum Note (Signed)
Addended by: Parke Poisson E on: 07/10/2017 02:59 PM   Modules accepted: Orders

## 2017-07-10 NOTE — Assessment & Plan Note (Signed)
Compensated without flare   Plan  Patient Instructions  Continue on Symbicort 2 puffs Twice daily  , rinse after use.  Continue on Spiriva daily  , rinse after use.  Chest xray today  Follow up Dr. [name]  In 3 months and As needed   Please contact office for sooner follow up if symptoms do not improve or worsen or seek emergency care

## 2017-07-10 NOTE — Patient Instructions (Signed)
Continue on Symbicort 2 puffs Twice daily  , rinse after use.  Continue on Spiriva daily  , rinse after use.  Chest xray today  Follow up Dr. [name]  In 3 months and As needed   Please contact office for sooner follow up if symptoms do not improve or worsen or seek emergency care

## 2017-07-10 NOTE — Assessment & Plan Note (Signed)
Cont on o2 .  

## 2017-07-22 ENCOUNTER — Telehealth: Payer: Self-pay | Admitting: Internal Medicine

## 2017-07-22 NOTE — Telephone Encounter (Signed)
Called [facility] to initiate the PA for the spiriva respimat---they will not cover this without the pt trying and failing either the stiolto or the spiriva handihaler.  MW please advise on which med you would like the pt to try.  Thanks

## 2017-07-23 NOTE — Telephone Encounter (Signed)
Give sample of spiriva respimat and ov before runs out  Preferably with med formulary for alternatives list

## 2017-08-28 ENCOUNTER — Emergency Department (HOSPITAL_COMMUNITY): Payer: Medicaid Other

## 2017-08-28 ENCOUNTER — Inpatient Hospital Stay (HOSPITAL_COMMUNITY)
Admission: EM | Admit: 2017-08-28 | Discharge: 2017-08-30 | DRG: 192 | Disposition: A | Discharge status: Home / Self Care | Payer: Medicaid Other | Attending: Internal Medicine | Admitting: Internal Medicine

## 2017-08-28 ENCOUNTER — Encounter (HOSPITAL_COMMUNITY): Payer: Self-pay | Admitting: Emergency Medicine

## 2017-08-28 DIAGNOSIS — Z882 Allergy status to sulfonamides status: Secondary | ICD-10-CM

## 2017-08-28 DIAGNOSIS — Z825 Family history of asthma and other chronic lower respiratory diseases: Secondary | ICD-10-CM

## 2017-08-28 DIAGNOSIS — Z79891 Long term (current) use of opiate analgesic: Secondary | ICD-10-CM

## 2017-08-28 DIAGNOSIS — Z7951 Long term (current) use of inhaled steroids: Secondary | ICD-10-CM

## 2017-08-28 DIAGNOSIS — Z9981 Dependence on supplemental oxygen: Secondary | ICD-10-CM

## 2017-08-28 DIAGNOSIS — R0602 Shortness of breath: Secondary | ICD-10-CM

## 2017-08-28 DIAGNOSIS — Z888 Allergy status to other drugs, medicaments and biological substances status: Secondary | ICD-10-CM

## 2017-08-28 DIAGNOSIS — J9621 Acute and chronic respiratory failure with hypoxia: Secondary | ICD-10-CM | POA: Diagnosis present

## 2017-08-28 DIAGNOSIS — F1010 Alcohol abuse, uncomplicated: Secondary | ICD-10-CM | POA: Diagnosis present

## 2017-08-28 DIAGNOSIS — J441 Chronic obstructive pulmonary disease with (acute) exacerbation: Principal | ICD-10-CM | POA: Diagnosis present

## 2017-08-28 DIAGNOSIS — Z8249 Family history of ischemic heart disease and other diseases of the circulatory system: Secondary | ICD-10-CM

## 2017-08-28 DIAGNOSIS — Z716 Tobacco abuse counseling: Secondary | ICD-10-CM

## 2017-08-28 DIAGNOSIS — J9622 Acute and chronic respiratory failure with hypercapnia: Secondary | ICD-10-CM | POA: Diagnosis present

## 2017-08-28 DIAGNOSIS — Z79899 Other long term (current) drug therapy: Secondary | ICD-10-CM

## 2017-08-28 DIAGNOSIS — K219 Gastro-esophageal reflux disease without esophagitis: Secondary | ICD-10-CM | POA: Diagnosis present

## 2017-08-28 DIAGNOSIS — I10 Essential (primary) hypertension: Secondary | ICD-10-CM | POA: Diagnosis present

## 2017-08-28 DIAGNOSIS — Z880 Allergy status to penicillin: Secondary | ICD-10-CM

## 2017-08-28 DIAGNOSIS — F17210 Nicotine dependence, cigarettes, uncomplicated: Secondary | ICD-10-CM | POA: Diagnosis present

## 2017-08-28 DIAGNOSIS — J9601 Acute respiratory failure with hypoxia: Secondary | ICD-10-CM

## 2017-08-28 DIAGNOSIS — Z832 Family history of diseases of the blood and blood-forming organs and certain disorders involving the immune mechanism: Secondary | ICD-10-CM

## 2017-08-28 IMAGING — DX DG CHEST 1V PORT
1 series · 2 of 2 positions shown · non-contrast
Comparison: Radiographs [DATE]

CLINICAL DATA: Shortness of breath.

EXAM:
PORTABLE CHEST 1 VIEW

[Series 1: chest · 0.14mm/px · 2 of 2 slices shown]
[im 1/2]
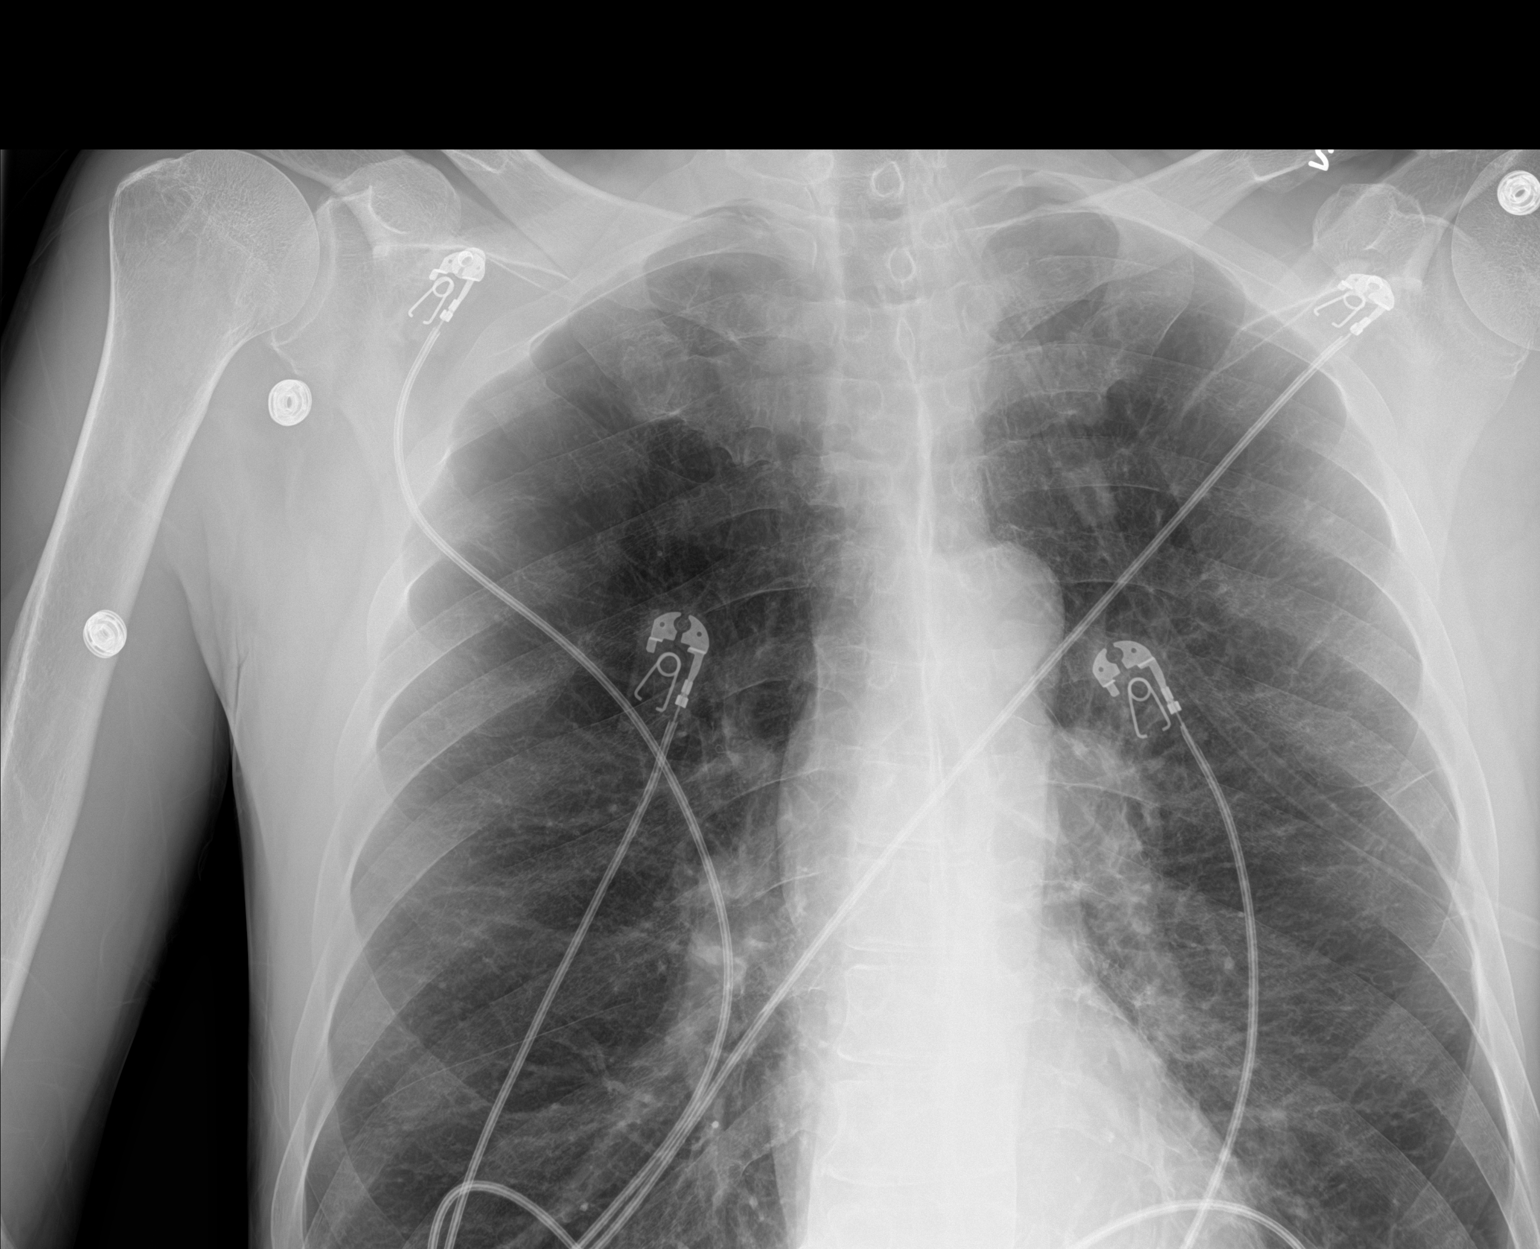
[im 2/2]
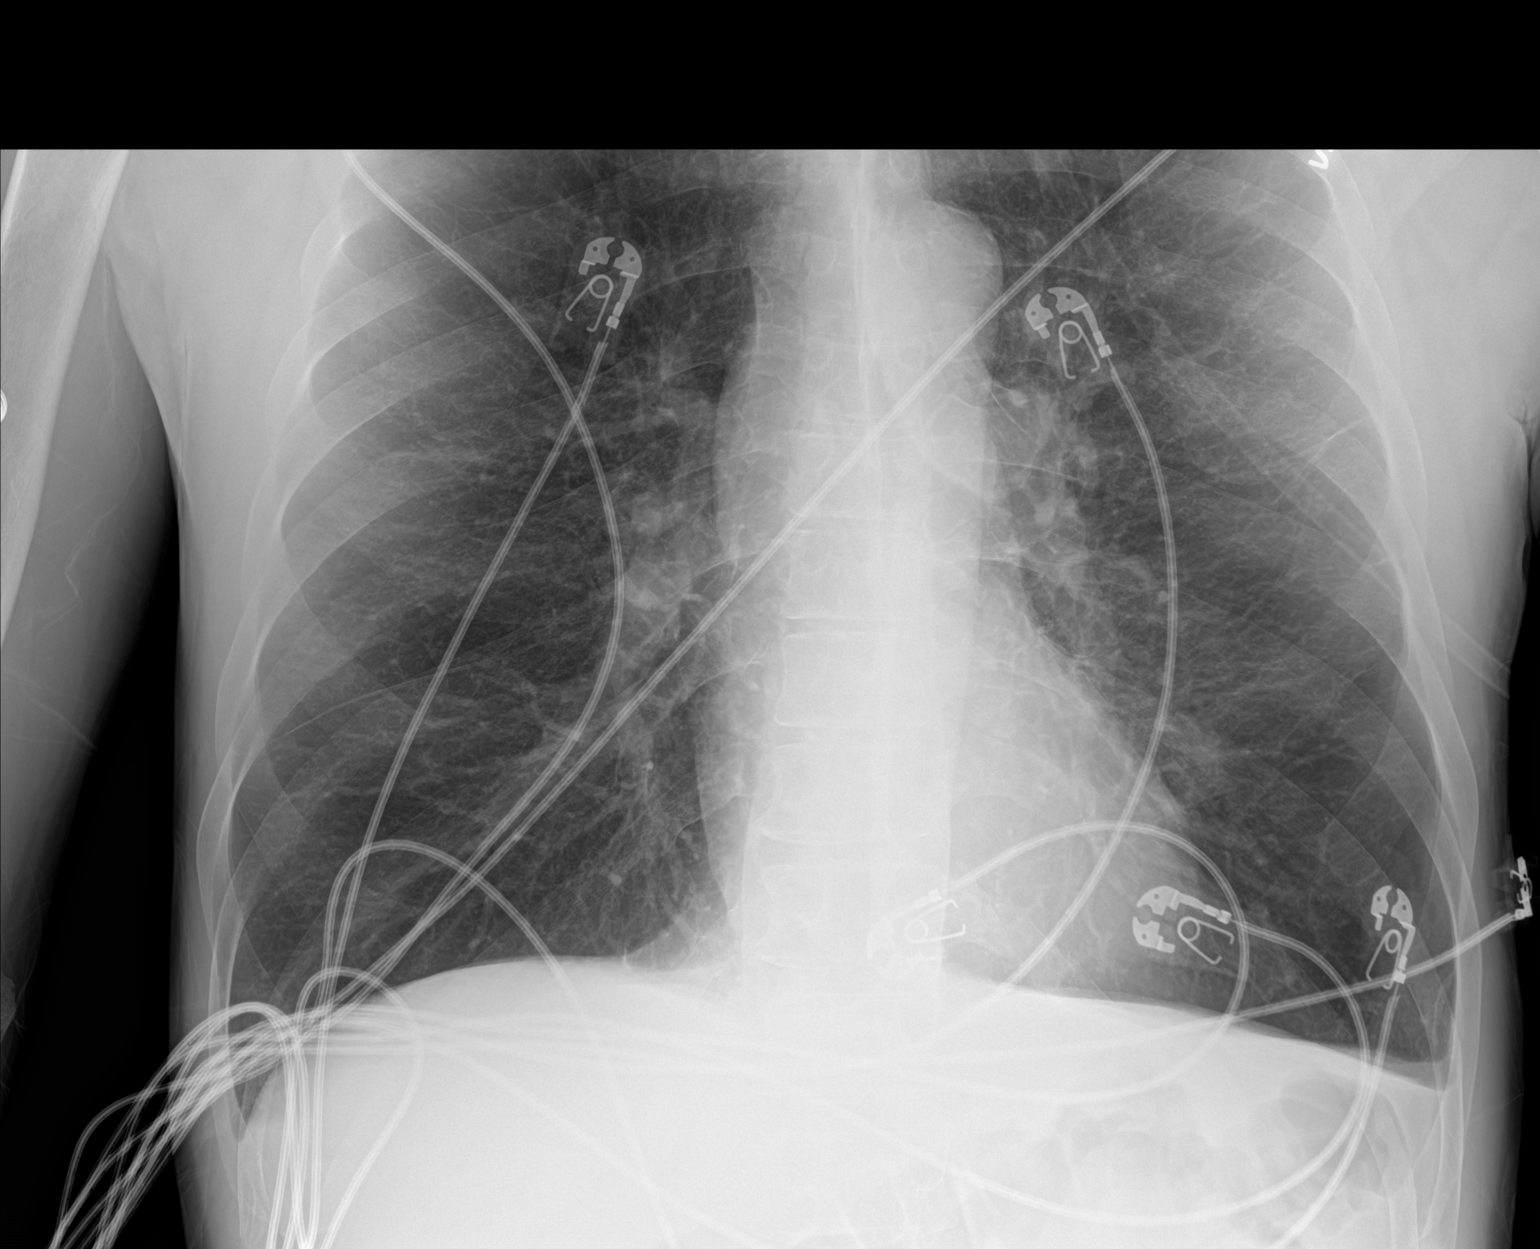

[2 of 2 positions shown; findings below may reference images not displayed]

FINDINGS: Again seen hyperinflation and emphysema. Chronic bronchial
thickening without progression. Mild scarring in the left midlung.
Normal heart size and mediastinal contours. No consolidation,
pulmonary edema, pneumothorax or pleural effusion. No acute osseous
abnormalities.
IMPRESSION: Hyperinflation emphysema consistent with COPD. No change from prior
exam or acute abnormality.

## 2017-08-28 MED ORDER — ALBUTEROL (5 MG/ML) CONTINUOUS INHALATION SOLN
10.0000 mg/h | INHALATION_SOLUTION | Freq: Once | RESPIRATORY_TRACT | Status: AC
  Administered 2017-08-29: 10 mg/h via RESPIRATORY_TRACT
  Filled 2017-08-28: qty 0.5

## 2017-08-28 NOTE — ED Triage Notes (Signed)
BIB EMS from home, called out for SOB. Pt states it has been present X2 weeks, also reports chest tightness. Diminished lung sounds, refused CPAP en route. Inc work of breathing noted.  Given 5 Albuterol, 0.5 Atrovent, 125 Solu-Medrol, 4g MgS

## 2017-08-29 DIAGNOSIS — F17210 Nicotine dependence, cigarettes, uncomplicated: Secondary | ICD-10-CM | POA: Diagnosis present

## 2017-08-29 DIAGNOSIS — K219 Gastro-esophageal reflux disease without esophagitis: Secondary | ICD-10-CM | POA: Diagnosis present

## 2017-08-29 DIAGNOSIS — Z7951 Long term (current) use of inhaled steroids: Secondary | ICD-10-CM | POA: Diagnosis not present

## 2017-08-29 DIAGNOSIS — Z832 Family history of diseases of the blood and blood-forming organs and certain disorders involving the immune mechanism: Secondary | ICD-10-CM | POA: Diagnosis not present

## 2017-08-29 DIAGNOSIS — J441 Chronic obstructive pulmonary disease with (acute) exacerbation: Secondary | ICD-10-CM | POA: Diagnosis present

## 2017-08-29 DIAGNOSIS — I10 Essential (primary) hypertension: Secondary | ICD-10-CM | POA: Diagnosis present

## 2017-08-29 DIAGNOSIS — Z882 Allergy status to sulfonamides status: Secondary | ICD-10-CM | POA: Diagnosis not present

## 2017-08-29 DIAGNOSIS — Z8249 Family history of ischemic heart disease and other diseases of the circulatory system: Secondary | ICD-10-CM | POA: Diagnosis not present

## 2017-08-29 DIAGNOSIS — Z79891 Long term (current) use of opiate analgesic: Secondary | ICD-10-CM | POA: Diagnosis not present

## 2017-08-29 DIAGNOSIS — R0602 Shortness of breath: Secondary | ICD-10-CM

## 2017-08-29 DIAGNOSIS — Z880 Allergy status to penicillin: Secondary | ICD-10-CM | POA: Diagnosis not present

## 2017-08-29 DIAGNOSIS — J9621 Acute and chronic respiratory failure with hypoxia: Secondary | ICD-10-CM | POA: Diagnosis not present

## 2017-08-29 DIAGNOSIS — Z825 Family history of asthma and other chronic lower respiratory diseases: Secondary | ICD-10-CM | POA: Diagnosis not present

## 2017-08-29 DIAGNOSIS — Z79899 Other long term (current) drug therapy: Secondary | ICD-10-CM | POA: Diagnosis not present

## 2017-08-29 DIAGNOSIS — Z9981 Dependence on supplemental oxygen: Secondary | ICD-10-CM | POA: Diagnosis not present

## 2017-08-29 DIAGNOSIS — J9622 Acute and chronic respiratory failure with hypercapnia: Secondary | ICD-10-CM | POA: Diagnosis present

## 2017-08-29 DIAGNOSIS — F1010 Alcohol abuse, uncomplicated: Secondary | ICD-10-CM | POA: Diagnosis present

## 2017-08-29 DIAGNOSIS — Z888 Allergy status to other drugs, medicaments and biological substances status: Secondary | ICD-10-CM | POA: Diagnosis not present

## 2017-08-29 DIAGNOSIS — Z720 Tobacco use: Secondary | ICD-10-CM | POA: Diagnosis not present

## 2017-08-29 DIAGNOSIS — Z716 Tobacco abuse counseling: Secondary | ICD-10-CM | POA: Diagnosis not present

## 2017-08-29 LAB — CBC
HCT: 44.8 % (ref 39.0–52.0)
HCT: 50.3 % (ref 39.0–52.0)
Hemoglobin: 15.2 g/dL (ref 13.0–17.0)
Hemoglobin: 17.3 g/dL — ABNORMAL HIGH (ref 13.0–17.0)
MCH: 33.2 pg (ref 26.0–34.0)
MCH: 33.9 pg (ref 26.0–34.0)
MCHC: 33.9 g/dL (ref 30.0–36.0)
MCHC: 34.4 g/dL (ref 30.0–36.0)
MCV: 97.8 fL (ref 78.0–100.0)
MCV: 98.4 fL (ref 78.0–100.0)
Platelets: 277 10*3/uL (ref 150–400)
Platelets: 286 10*3/uL (ref 150–400)
RBC: 4.58 MIL/uL (ref 4.22–5.81)
RBC: 5.11 MIL/uL (ref 4.22–5.81)
RDW: 14 % (ref 11.5–15.5)
RDW: 14.1 % (ref 11.5–15.5)
WBC: 10.1 10*3/uL (ref 4.0–10.5)
WBC: 20.8 10*3/uL — ABNORMAL HIGH (ref 4.0–10.5)

## 2017-08-29 LAB — BASIC METABOLIC PANEL
Anion gap: 9 (ref 5–15)
BUN: 11 mg/dL (ref 6–20)
CO2: 27 mmol/L (ref 22–32)
Calcium: 8.9 mg/dL (ref 8.9–10.3)
Chloride: 103 mmol/L (ref 101–111)
Creatinine, Ser: 1.01 mg/dL (ref 0.61–1.24)
GFR calc Af Amer: >60 mL/min (ref >60)
GFR calc non Af Amer: >60 mL/min (ref >60)
Glucose, Bld: 113 mg/dL — ABNORMAL HIGH (ref 65–99)
Potassium: 3.6 mmol/L (ref 3.5–5.1)
Sodium: 139 mmol/L (ref 135–145)

## 2017-08-29 MED ORDER — ENOXAPARIN SODIUM 40 MG/0.4ML ~~LOC~~ SOLN
40.0000 mg | SUBCUTANEOUS | Status: DC
  Administered 2017-08-29: 40 mg via SUBCUTANEOUS
  Filled 2017-08-29: qty 0.4

## 2017-08-29 MED ORDER — LOXAPINE SUCCINATE 5 MG PO CAPS
10.0000 mg | ORAL_CAPSULE | Freq: Every day | ORAL | Status: DC
  Filled 2017-08-29: qty 2

## 2017-08-29 MED ORDER — ALBUTEROL SULFATE (2.5 MG/3ML) 0.083% IN NEBU: 2.5000 mg | INHALATION_SOLUTION | RESPIRATORY_TRACT | Status: DC | PRN

## 2017-08-29 MED ORDER — LOSARTAN POTASSIUM 50 MG PO TABS
100.0000 mg | ORAL_TABLET | Freq: Every day | ORAL | Status: DC
  Administered 2017-08-29 – 2017-08-30 (×2): 100 mg via ORAL
  Filled 2017-08-29 (×2): qty 2

## 2017-08-29 MED ORDER — LORATADINE 10 MG PO TABS
10.0000 mg | ORAL_TABLET | Freq: Every day | ORAL | Status: DC
  Administered 2017-08-29 – 2017-08-30 (×2): 10 mg via ORAL
  Filled 2017-08-29 (×2): qty 1

## 2017-08-29 MED ORDER — LOSARTAN POTASSIUM-HCTZ 100-12.5 MG PO TABS: 1.0000 | ORAL_TABLET | Freq: Every morning | ORAL | Status: DC

## 2017-08-29 MED ORDER — TRAMADOL HCL 50 MG PO TABS: 100.0000 mg | ORAL_TABLET | Freq: Four times a day (QID) | ORAL | Status: DC | PRN

## 2017-08-29 MED ORDER — ALBUTEROL SULFATE (2.5 MG/3ML) 0.083% IN NEBU
2.5000 mg | INHALATION_SOLUTION | Freq: Three times a day (TID) | RESPIRATORY_TRACT | Status: DC
  Administered 2017-08-29 – 2017-08-30 (×4): 2.5 mg via RESPIRATORY_TRACT
  Filled 2017-08-29 (×4): qty 3

## 2017-08-29 MED ORDER — ALBUTEROL SULFATE HFA 108 (90 BASE) MCG/ACT IN AERS: 2.0000 | INHALATION_SPRAY | RESPIRATORY_TRACT | Status: DC | PRN

## 2017-08-29 MED ORDER — AMLODIPINE BESYLATE 10 MG PO TABS
10.0000 mg | ORAL_TABLET | Freq: Every day | ORAL | Status: DC
  Administered 2017-08-29 – 2017-08-30 (×2): 10 mg via ORAL
  Filled 2017-08-29 (×2): qty 1

## 2017-08-29 MED ORDER — RISPERIDONE 2 MG PO TABS
2.0000 mg | ORAL_TABLET | Freq: Every day | ORAL | Status: DC
  Filled 2017-08-29: qty 1

## 2017-08-29 MED ORDER — FLUTICASONE FUROATE-VILANTEROL 200-25 MCG/INH IN AEPB
1.0000 | INHALATION_SPRAY | Freq: Every day | RESPIRATORY_TRACT | Status: DC
  Administered 2017-08-29 – 2017-08-30 (×2): 1 via RESPIRATORY_TRACT
  Filled 2017-08-29: qty 28

## 2017-08-29 MED ORDER — ONDANSETRON HCL 4 MG/2ML IJ SOLN: 4.0000 mg | Freq: Four times a day (QID) | INTRAMUSCULAR | Status: DC | PRN

## 2017-08-29 MED ORDER — BENZTROPINE MESYLATE 1 MG PO TABS
0.5000 mg | ORAL_TABLET | Freq: Every day | ORAL | Status: DC
  Filled 2017-08-29: qty 1

## 2017-08-29 MED ORDER — ACETAMINOPHEN 325 MG PO TABS: 650.0000 mg | ORAL_TABLET | Freq: Four times a day (QID) | ORAL | Status: DC | PRN

## 2017-08-29 MED ORDER — AZITHROMYCIN 500 MG PO TABS
500.0000 mg | ORAL_TABLET | Freq: Once | ORAL | Status: AC
  Administered 2017-08-29: 500 mg via ORAL
  Filled 2017-08-29: qty 1

## 2017-08-29 MED ORDER — ENSURE ENLIVE PO LIQD
237.0000 mL | Freq: Two times a day (BID) | ORAL | Status: DC
  Administered 2017-08-29: 237 mL via ORAL

## 2017-08-29 MED ORDER — ADULT MULTIVITAMIN W/MINERALS CH
1.0000 | ORAL_TABLET | Freq: Every day | ORAL | Status: DC
  Administered 2017-08-29 – 2017-08-30 (×2): 1 via ORAL
  Filled 2017-08-29 (×2): qty 1

## 2017-08-29 MED ORDER — TIOTROPIUM BROMIDE MONOHYDRATE 18 MCG IN CAPS
18.0000 ug | ORAL_CAPSULE | Freq: Every day | RESPIRATORY_TRACT | Status: DC
  Administered 2017-08-29 – 2017-08-30 (×2): 18 ug via RESPIRATORY_TRACT
  Filled 2017-08-29: qty 5

## 2017-08-29 MED ORDER — PREDNISONE 20 MG PO TABS
40.0000 mg | ORAL_TABLET | Freq: Every day | ORAL | Status: DC
  Filled 2017-08-29: qty 2

## 2017-08-29 MED ORDER — METHYLPREDNISOLONE SODIUM SUCC 125 MG IJ SOLR
60.0000 mg | INTRAMUSCULAR | Status: DC
  Administered 2017-08-29: 60 mg via INTRAVENOUS
  Filled 2017-08-29: qty 2

## 2017-08-29 MED ORDER — ACETAMINOPHEN 650 MG RE SUPP: 650.0000 mg | Freq: Four times a day (QID) | RECTAL | Status: DC | PRN

## 2017-08-29 MED ORDER — AZITHROMYCIN 500 MG PO TABS
250.0000 mg | ORAL_TABLET | Freq: Every day | ORAL | Status: DC
  Administered 2017-08-30: 250 mg via ORAL
  Filled 2017-08-29: qty 1

## 2017-08-29 MED ORDER — HYDROCHLOROTHIAZIDE 12.5 MG PO CAPS
12.5000 mg | ORAL_CAPSULE | Freq: Every day | ORAL | Status: DC
  Administered 2017-08-29 – 2017-08-30 (×2): 12.5 mg via ORAL
  Filled 2017-08-29 (×2): qty 1

## 2017-08-29 MED ORDER — IPRATROPIUM BROMIDE 0.02 % IN SOLN
0.5000 mg | Freq: Once | RESPIRATORY_TRACT | Status: AC
  Administered 2017-08-29: 0.5 mg via RESPIRATORY_TRACT
  Filled 2017-08-29: qty 2.5

## 2017-08-29 MED ORDER — ALBUTEROL SULFATE (2.5 MG/3ML) 0.083% IN NEBU
2.5000 mg | INHALATION_SOLUTION | Freq: Once | RESPIRATORY_TRACT | Status: AC
Start: 2017-08-29 — End: 2017-08-29
  Administered 2017-08-29: 2.5 mg via RESPIRATORY_TRACT
  Filled 2017-08-29: qty 3

## 2017-08-29 MED ORDER — ONDANSETRON HCL 4 MG PO TABS: 4.0000 mg | ORAL_TABLET | Freq: Four times a day (QID) | ORAL | Status: DC | PRN

## 2017-08-29 MED ORDER — NICOTINE 7 MG/24HR TD PT24
7.0000 mg | MEDICATED_PATCH | Freq: Every day | TRANSDERMAL | Status: DC
  Filled 2017-08-29 (×2): qty 1

## 2017-08-29 NOTE — H&P (Signed)
History and Physical    JONATHAN CORPUS YSA:[id number] DOB: [birth year] DOA: 08/28/2017  PCP: [name], FNP  Patient coming from: Home  I have personally briefly reviewed patient's old medical records in [place]  Chief Complaint: SOB  HPI: Tony Sullivan is a 56 y.o. male with medical history significant of COPD, multiple admits for same, O2 dependent at baseline 2-3L as needed usually while sleeping he says.  Patient of Dr. Gustavus Bryant.  Patient presents to the ED with c/o SOB, wheezing, chest tightness.  Symptoms onset yesterday.  Symptoms similar to all prior COPD exacerbations.  DOE.  Home meds not working.  He does admit to still smoking some "its hard to quit after 40 years".   ED Course: Symptoms improved with neb treatments, steroids.   Review of Systems: As per HPI otherwise 10 point review of systems negative.   Past Medical History:  Diagnosis Date  . Asthma   . COPD (chronic obstructive pulmonary disease) ([place]) 2004   diagnosed in 2004, no PFT's to date.  Started on home O2 12/2013, after found to be desatting at PCP's office, and referred to pulmonology.  . High blood pressure   . Seasonal allergies     Past Surgical History:  Procedure Laterality Date  . APPENDECTOMY  1980  . COLONOSCOPY WITH PROPOFOL N/A 12/10/2016   Procedure: COLONOSCOPY WITH PROPOFOL;  Surgeon: [name], MD;  Location: [facility] END[facility]COPY;  Service: Endoscopy;  Laterality: N/A;  . HIP SURGERY Right   . SHOULDER ARTHROSCOPY Right      reports that he quit smoking about 3 months ago. His smoking use included Cigarettes. He has a 36.00 pack-year smoking history. He has never used smokeless tobacco. He reports that he drinks about 8.4 - 12.6 oz of alcohol per week . He reports that he does not use drugs.  Allergies  Allergen Reactions  . Penicillins Anaphylaxis    Has patient had a PCN reaction causing immediate rash, facial/tongue/throat swelling, SOB or lightheadedness with  hypotension: Yes Has patient had a PCN reaction causing severe rash involving mucus membranes or skin necrosis: No Has patient had a PCN reaction that required hospitalization No Has patient had a PCN reaction occurring within the last 10 years: No If all of the above answers are "NO", then may proceed with Cephalosporin use.   . Sulfa Antibiotics Swelling    Swelling of hands and throat   . Famotidine Other (See Comments)    Bloating of stomach, loss of appetite  . Levocetirizine Other (See Comments)    Muscle cramps    Family History  Problem Relation Age of Onset  . Emphysema Mother   . Allergies Mother        "everyone in family"  . Asthma Mother        "everyone in family"  . Heart disease Mother   . Clotting disorder Mother   . Cancer Mother   . Emphysema Father   . Allergies Father   . Allergies Son   . Heart disease Maternal Aunt      Prior to Admission medications   Medication Sig Start Date End Date Taking? Authorizing Provider  albuterol (PROAIR HFA) 108 (90 Base) MCG/ACT inhaler 2 puffs up to every 4 hours if needed for short of breath Patient taking differently: Inhale 2 puffs into the lungs every 4 (four) hours as needed for shortness of breath. 2 puffs up to every 4 hours if needed for short of breath  12/05/16  Yes [name], MD  albuterol (PROVENTIL) (2.5 MG/3ML) 0.083% nebulizer solution Take 2.5 mg by nebulization every 4 (four) hours as needed for wheezing or shortness of breath.    Yes [provider]  amLODipine (NORVASC) 10 MG tablet Take 10 mg by mouth every morning.    Yes [provider]  benztropine (COGENTIN) 0.5 MG tablet Take 0.5 mg by mouth at bedtime.    Yes [provider]  budesonide-formoterol (SYMBICORT) 160-4.5 MCG/ACT inhaler INHALE TWO PUFFS INTO THE LUNGS TWO (TWO) TIMES DAILY. 12/05/16  Yes [name], MD  cetirizine (ZYRTEC) 10 MG tablet Take 10 mg by mouth daily.   Yes [provider]    losartan-hydrochlorothiazide (HYZAAR) 100-12.5 MG tablet Take 1 tablet by mouth every morning.   Yes [provider]  loxapine (LOXITANE) 10 MG capsule Take 10 mg by mouth at bedtime.    Yes [provider]  Multiple Vitamin (MULTIVITAMIN) capsule Take 1 capsule by mouth daily.   Yes [provider]  risperiDONE (RISPERDAL) 2 MG tablet Take 2 mg by mouth at bedtime.   Yes [provider]  Tiotropium Bromide Monohydrate (SPIRIVA RESPIMAT) 2.5 MCG/ACT AERS Inhale 2 puffs into the lungs daily. 07/10/17  Yes Parrett, Tammy S, NP  traMADol (ULTRAM) 50 MG tablet Take 100 mg by mouth every 6 (six) hours as needed for moderate pain.   Yes [provider]  OXYGEN Inhale 2 L into the lungs as directed. 2-3 liters with sleep and exertion as needed for low oxygen    [provider]  Respiratory Therapy Supplies (FLUTTER) DEVI Use as directed 04/01/16   [name], MD    Physical Exam: Vitals:   08/29/17 0230 08/29/17 0300 08/29/17 0330 08/29/17 0400  BP: 111/72 112/73 111/85 123/81  Pulse: 78 73 75 82  Resp: 19 15 15 19   Temp:      TempSrc:      SpO2: 98% 98% 93% 93%  Weight:      Height:        Constitutional: NAD, calm, comfortable Eyes: PERRL, lids and conjunctivae normal ENMT: Mucous membranes are moist. Posterior pharynx clear of any exudate or lesions.Normal dentition.  Neck: normal, supple, no masses, no thyromegaly Respiratory: clear to auscultation bilaterally, no wheezing Cardiovascular: Regular rate and rhythm, no murmurs / rubs / gallops. No extremity edema. 2+ pedal pulses. No carotid bruits.  Abdomen: no tenderness, no masses palpated. No hepatosplenomegaly. Bowel sounds positive.  Musculoskeletal: no clubbing / cyanosis. No joint deformity upper and lower extremities. Good ROM, no contractures. Normal muscle tone.  Skin: no rashes, lesions, ulcers. No induration Neurologic: CN 2-12 grossly intact. Sensation intact, DTR  normal. Strength 5/5 in all 4.  Psychiatric: Normal judgment and insight. Alert and oriented x 3. Normal mood.    Labs on Admission: I have personally reviewed following labs and imaging studies  CBC:  Recent Labs Lab 08/28/17 2348  WBC 20.8*  HGB 17.3*  HCT 50.3  MCV 98.4  PLT 825   Basic Metabolic Panel:  Recent Labs Lab 08/28/17 2348  NA 139  K 3.6  CL 103  CO2 27  GLUCOSE 113*  BUN 11  CREATININE 1.01  CALCIUM 8.9   GFR: Estimated Creatinine Clearance: 80.9 mL/min (by C-G formula based on SCr of 1.01 mg/dL). Liver Function Tests: No results for input(s): AST, ALT, ALKPHOS, BILITOT, PROT, ALBUMIN in the last 168 hours. No results for input(s): LIPASE, AMYLASE in the last  168 hours. No results for input(s): AMMONIA in the last 168 hours. Coagulation Profile: No results for input(s): INR, PROTIME in the last 168 hours. Cardiac Enzymes: No results for input(s): CKTOTAL, CKMB, CKMBINDEX, TROPONINI in the last 168 hours. BNP (last 3 results) No results for input(s): PROBNP in the last 8760 hours. HbA1C: No results for input(s): HGBA1C in the last 72 hours. CBG: No results for input(s): GLUCAP in the last 168 hours. Lipid Profile: No results for input(s): CHOL, HDL, LDLCALC, TRIG, CHOLHDL, LDLDIRECT in the last 72 hours. Thyroid Function Tests: No results for input(s): TSH, T4TOTAL, FREET4, T3FREE, THYROIDAB in the last 72 hours. Anemia Panel: No results for input(s): VITAMINB12, FOLATE, FERRITIN, TIBC, IRON, RETICCTPCT in the last 72 hours. Urine analysis:    Component Value Date/Time   COLORURINE AMBER (A) 06/12/2015 1021   APPEARANCEUR CLEAR 06/12/2015 1021   LABSPEC 1.018 06/12/2015 1021   PHURINE 5.5 06/12/2015 1021   GLUCOSEU NEGATIVE 06/12/2015 1021   HGBUR NEGATIVE 06/12/2015 1021   BILIRUBINUR NEGATIVE 06/12/2015 1021   KETONESUR NEGATIVE 06/12/2015 1021   PROTEINUR NEGATIVE 06/12/2015 1021   UROBILINOGEN 1.0 06/12/2015 1021   NITRITE NEGATIVE  06/12/2015 1021   LEUKOCYTESUR NEGATIVE 06/12/2015 1021    Radiological Exams on Admission: Dg Chest Port 1 View  Result Date: 08/29/2017 CLINICAL DATA:  Shortness of breath. EXAM: PORTABLE CHEST 1 VIEW COMPARISON:  Radiographs 07/10/2017 FINDINGS: Again seen hyperinflation and emphysema. Chronic bronchial thickening without progression. Mild scarring in the left midlung. Normal heart size and mediastinal contours. No consolidation, pulmonary edema, pneumothorax or pleural effusion. No acute osseous abnormalities. IMPRESSION: Hyperinflation emphysema consistent with COPD. No change from prior exam or acute abnormality. Electronically Signed   By: Jeb Levering M.D.   On: 08/29/2017 00:15    EKG: Independently reviewed.  Assessment/Plan Principal Problem:   COPD with acute exacerbation (HCC) Active Problems:   Essential hypertension   Acute on chronic respiratory failure with hypoxia ([place])    1. COPD exacerbation - 1. COPD pathway 2. Prednisone 3. Azithromycin 4. Neb treatments 5. Nicotine patch and council on quitting smoking 2. HTN - continue home meds 3. No recent heavy drinking he states - 1 beer yesterday only  DVT prophylaxis: Lovenox Code Status: Full Family Communication: Family at bedside Disposition Plan: Home after admit Consults called: None Admission status: Place in [place], [place] [facility] Pager [phone]  If 7AM-7PM, please contact day team taking care of patient [email or web address] Password [email or web address]  08/29/2017, 4:14 AM

## 2017-08-29 NOTE — Progress Notes (Signed)
Pt admitted to 5w from ED. Pt is alert and oriented x4. Vss. Lungs are diminished. Pt oriented to the unit and equipment. White identification bracelet in place. Call light within reach. WCTM

## 2017-08-29 NOTE — ED Notes (Signed)
Pt ambulated with pulse ox, O2 sats dropped from 96% to 85% with ambulation. Pt also became SOB, very winded. EDP is aware

## 2017-08-29 NOTE — Progress Notes (Signed)
No charge note:  Patient was seen and examined at bedside. He was admitted after midnight. Please see H&P for detail. 56 year old gentleman with history of COPD, on home oxygen as needed presented with worsening shortness of breath and dry cough for about 2 weeks. On examination patient is still have shortness of breath and chest tightness on physical exam. He is actively smoker, education provided to the patient.chest x-ray consistent with COPD. Change prednisone to IV Solu-Medrol because of persistent chest tightness and diffuse wheezing Currently on 3 L of oxygen Negative in patch Continue azithromycin, steroids and bronchodilators.  On exam: Sitting on bed, not in distress Lungs bilateral diffuse wheezing and chest tightness Regular rate rhythm, S1-S2 normal No lower extremity edema.  DVT prophylaxis: Lovenox subcutaneous If patient clinically improved, may be able to go home tomorrow.

## 2017-08-29 NOTE — Progress Notes (Signed)
Initial Nutrition Assessment  DOCUMENTATION CODES:  Non-severe (moderate) malnutrition in context of chronic illness  INTERVENTION:  Ensure Enlive po BID, each supplement provides 350 kcal and 20 grams of protein  Nutrition Education given on plethora of topics. Handouts and coupons provided.   NUTRITION DIAGNOSIS:  Increased nutrient needs related to catabolic illness (COPD) as evidenced by mild muscle/fat depletion  GOAL:  Patient will meet greater than or equal to 90% of their needs  MONITOR:  PO intake, Labs, Weight trends, I & O's  REASON FOR ASSESSMENT:  Consult COPD Protocol  ASSESSMENT:  56 y/o male PMHx COPD (prn o2), HTN, ongoing tobacco abuse. Presented to ED w/ SOB, Wheezing, chest tightness. Admitted for COPD exacerbation  Patient states that he is eating fairly well at home. However, He endorses weight loss. He says his UBW is 155 lbs. Per review of chart, w/ exception of a couple outliers, his weight has been 150-155 lbs for over 2 years. At baseline, he admits to a disordered eating pattern. Will periodically skip meals and says he doesn't eat a "balanced diet".   He has numerous miscellaneous nutrition questions. He is curious about what foods "give you the most energy", what foods "are best for COPD", what foods "are bad for COPD", "why sugars are bad" etc etc. In a nutshell, RD explained that he should eat small amounts, frequently. He should prioritize high kcal/protein foods, eat vegetables/other anti-inflammatory items and reduce simple carbohydrates/proinflammatory items.   He is given coupons and handouts titled "COPD Nutrition Therapy" and "High-Calorie, High-Protein Nutrition THerapy".    He was very Patent attorney of conversation  Physical Exam: Mild orbital/underarm fat wasting, Mild muscle wasting of deltoids, clavicular musculature  Labs: WBC: 20.8,  Meds: hctz, abx, MVI with minerals, Methylprednisolone, Risperidone   NUTRITION - FOCUSED PHYSICAL  EXAM:   Most Recent Value  Orbital Region  Mild depletion  Upper Arm Region  Mild depletion  Thoracic and Lumbar Region  Mild depletion  Buccal Region  No depletion  Temple Region  Mild depletion  Clavicle Bone Region  Mild depletion  Clavicle and Acromion Bone Region  Mild depletion  Scapular Bone Region  No depletion  Dorsal Hand  No depletion  Patellar Region  No depletion  Anterior Thigh Region  No depletion  Posterior Calf Region  Mild depletion      Diet Order:  Diet Heart Room service appropriate? Yes; Fluid consistency: Thin  EDUCATION NEEDS:  Education needs have been addressed  Skin:  Skin Assessment: Reviewed RN Assessment  Last BM:  11/1  Height:  Ht Readings from Last 1 Encounters:  08/29/17 5\' 11"  (1.803 m)   Weight:  Wt Readings from Last 1 Encounters:  08/28/17 155 lb (70.3 kg)   Wt Readings from Last 10 Encounters:  08/28/17 155 lb (70.3 kg)  07/10/17 156 lb (70.8 kg)  06/05/17 153 lb (69.4 kg)  03/30/17 144 lb 12.8 oz (65.7 kg)  03/27/17 152 lb 12.8 oz (69.3 kg)  02/25/17 153 lb (69.4 kg)  12/10/16 156 lb (70.8 kg)  12/05/16 156 lb (70.8 kg)  10/17/16 154 lb (69.9 kg)  08/20/16 152 lb 12.8 oz (69.3 kg)   Ideal Body Weight:  78.18 kg  BMI:  Body mass index is 21.62 kg/m.  Estimated Nutritional Needs:  Kcal:  2100-2300 (30-33 kcal/kg bw) Protein:  85-98g Pro (1.2-1.4 g/kg bw) Fluid:  2.1-2.3 L fluid   Burtis Junes RD, LDN, CNSC Clinical Nutrition Pager: [phone] 08/29/2017 2:38 PM

## 2017-08-29 NOTE — ED Provider Notes (Signed)
[facility] EMERGENCY DEPARTMENT Provider Note   CSN: [id number] Arrival date & time: 08/28/17  2327     History   Chief Complaint Chief Complaint  Patient presents with  . Shortness of Breath    HPI DAHMIR Tony Sullivan is a 56 y.o. male.  The history is provided by the patient.  Shortness of Breath  This is a new problem. The average episode lasts 1 day. The problem occurs continuously.The problem has been rapidly worsening. Associated symptoms include cough and wheezing. Pertinent negatives include no fever, no vomiting and no leg swelling. Associated symptoms comments: Chest tightness . He has had prior hospitalizations. Associated medical issues include COPD.   Patient h/o COPD, ETOH abuse He is on oxygen at night at home He reports for past 2 weeks he has had increased difficulty breathing, however over past day his symptoms have worsened He has cough/sob/chest tightness He reports this is all similar to prior COPD exacerbations He is a smoker He reports dyspnea on exertion His home meds were not working  Past Medical History:  Diagnosis Date  . Asthma   . COPD (chronic obstructive pulmonary disease) ([place]) 2004   diagnosed in 2004, no PFT's to date.  Started on home O2 12/2013, after found to be desatting at PCP's office, and referred to pulmonology.  . High blood pressure   . Seasonal allergies     Patient Active Problem List   Diagnosis Date Noted  . ETOH abuse 03/30/2017  . Cigarette smoker 03/30/2017  . Acute respiratory failure with hypoxia and hypercapnia ([place]) 03/29/2017  . Special screening for malignant neoplasms, colon   . Benign neoplasm of ascending colon   . Benign neoplasm of descending colon   . Hoarseness 08/22/2016  . GERD (gastroesophageal reflux disease) 08/22/2016  . Atypical chest pain 12/05/2015  . Essential hypertension 06/15/2014  . Pectoralis muscle strain 01/11/2014  . COPD with acute exacerbation ([place]) 01/10/2014  .  COPD  GOLD II 01/08/2014  . Smoker 01/08/2014  . Cough 01/08/2014  . Chronic respiratory failure with hypoxia ([place]) 01/08/2014    Past Surgical History:  Procedure Laterality Date  . APPENDECTOMY  1980  . COLONOSCOPY WITH PROPOFOL N/A 12/10/2016   Procedure: COLONOSCOPY WITH PROPOFOL;  Surgeon: [name], MD;  Location: [facility];  Service: Endoscopy;  Laterality: N/A;  . HIP SURGERY Right   . SHOULDER ARTHROSCOPY Right        Home Medications    Prior to Admission medications   Medication Sig Start Date End Date Taking? Authorizing Provider  albuterol (PROAIR HFA) 108 (90 Base) MCG/ACT inhaler 2 puffs up to every 4 hours if needed for short of breath Patient taking differently: Inhale 2 puffs into the lungs every 4 (four) hours as needed for shortness of breath. 2 puffs up to every 4 hours if needed for short of breath 12/05/16   [name], MD  albuterol (PROVENTIL) (2.5 MG/3ML) 0.083% nebulizer solution Take 2.5 mg by nebulization every 4 (four) hours as needed for wheezing or shortness of breath.     [provider]  amLODipine (NORVASC) 10 MG tablet Take 10 mg by mouth every morning.     [provider]  benztropine (COGENTIN) 0.5 MG tablet Take 0.5 mg by mouth at bedtime.     [provider]  budesonide-formoterol (SYMBICORT) 160-4.5 MCG/ACT inhaler INHALE TWO PUFFS INTO THE LUNGS TWO (TWO) TIMES DAILY. 12/05/16   [name], MD  cetirizine (ZYRTEC) 10  MG tablet Take 10 mg by mouth daily.    [provider]  dextromethorphan-guaiFENesin (MUCINEX DM) 30-600 MG 12hr tablet Take 1 tablet by mouth once as needed for cough.    [provider]  fluticasone (FLONASE) 50 MCG/ACT nasal spray PLACE TWO SPRAYS INTO BOTH NOSTRILS THREE TIMES DAILY AS NEEDED FOR ALLERGIES. Patient taking differently: PLACE TWO SPRAYS INTO BOTH NOSTRILS DAILY AS NEEDED FOR ALLERGIES. 02/28/17   [name], MD  losartan-hydrochlorothiazide (HYZAAR)  100-12.5 MG tablet Take 1 tablet by mouth every morning.    [provider]  loxapine (LOXITANE) 10 MG capsule Take 10 mg by mouth at bedtime.     [provider]  mirtazapine (REMERON) 30 MG tablet Take 30 mg by mouth at bedtime.    [provider]  Multiple Vitamin (MULTIVITAMIN) capsule Take 1 capsule by mouth daily.    [provider]  omeprazole (PRILOSEC) 20 MG capsule TAKE 1 CAPSULE (20 MG TOTAL) BY MOUTH DAILY. Patient taking differently: Take 40 mg by mouth every morning.  12/31/16   [name], MD  OXYGEN Inhale 2 L into the lungs as directed. 2-3 liters with sleep and exertion as needed for low oxygen    [provider]  predniSONE (DELTASONE) 10 MG tablet Take  2 daily until better then 1 daily 04/08/17   Louellen Molder, MD  Respiratory Therapy Supplies (FLUTTER) DEVI Use as directed 04/01/16   [name], MD  risperiDONE (RISPERDAL) 2 MG tablet Take 2 mg by mouth at bedtime.    [provider]  Tiotropium Bromide Monohydrate (SPIRIVA RESPIMAT) 2.5 MCG/ACT AERS Inhale 2 puffs into the lungs daily. 07/10/17   Parrett, Fonnie Mu, NP  Tiotropium Bromide Monohydrate (SPIRIVA RESPIMAT) 2.5 MCG/ACT AERS Inhale 2 puffs into the lungs daily. 07/10/17   Parrett, Fonnie Mu, NP  traMADol (ULTRAM) 50 MG tablet Take 100 mg by mouth every 6 (six) hours as needed for moderate pain.    [provider]    Family History Family History  Problem Relation Age of Onset  . Emphysema Mother   . Allergies Mother        "everyone in family"  . Asthma Mother        "everyone in family"  . Heart disease Mother   . Clotting disorder Mother   . Cancer Mother   . Emphysema Father   . Allergies Father   . Allergies Son   . Heart disease Maternal Aunt     Social History Social History  Substance Use Topics  . Smoking status: Former Smoker    Packs/day: 1.00    Years: 36.00    Types: Cigarettes    Quit date: 05/28/2017  . Smokeless  tobacco: Never Used  . Alcohol use 8.4 - 12.6 oz/week    14 - 21 Standard drinks or equivalent per week     Comment: 2-3 beers/day, with occasional "binges".  No history of withdrawal     Allergies   Penicillins; Sulfa antibiotics; Famotidine; and Levocetirizine   Review of Systems Review of Systems  Constitutional: Negative for fever.  Respiratory: Positive for cough, shortness of breath and wheezing.   Cardiovascular: Negative for leg swelling.  Gastrointestinal: Negative for vomiting.  All other systems reviewed and are negative.    Physical Exam Updated Vital Signs BP 140/81 (BP Location: Right Arm)   Pulse 89   Temp 98.1 F (36.7 C) (Oral)   Resp (!) 26   Ht 1.727 m (  5\' 8" )   Wt 70.3 kg (155 lb)   SpO2 97%   BMI 23.57 kg/m   Physical Exam CONSTITUTIONAL: Thin appearing, ill appearing HEAD: Normocephalic/atraumatic EYES: EOMI/PERRL ENMT: Mucous membranes moist NECK: supple no meningeal signs SPINE/BACK:entire spine nontender CV: S1/S2 noted, no murmurs/rubs/gallops noted LUNGS:wheezing bilaterally,, accessory muscle use, he is able to speak to me clearly ABDOMEN: soft, nontender, no rebound or guarding, bowel sounds noted throughout abdomen GU:no cva tenderness NEURO: Pt is awake/alert/appropriate, moves all extremitiesx4.  No facial droop.   EXTREMITIES: pulses normal/equal, full ROM, no LE edema noted SKIN: warm, color normal PSYCH: no abnormalities of mood noted, alert and oriented to situation   ED Treatments / Results  Labs (all labs ordered are listed, but only abnormal results are displayed) Labs Reviewed  BASIC METABOLIC PANEL - Abnormal; Notable for the following:       Result Value   Glucose, Bld 113 (*)    All other components within normal limits  CBC - Abnormal; Notable for the following:    WBC 20.8 (*)    Hemoglobin 17.3 (*)    All other components within normal limits    EKG  EKG Interpretation  Date/Time:  Thursday August 28 2017 23:35:00 EDT Ventricular Rate:  89 PR Interval:    QRS Duration: 82 QT Interval:  370 QTC Calculation: 451 R Axis:   0 Text Interpretation:  Sinus rhythm Biatrial enlargement Indeterminate axis Probable left ventricular hypertrophy Anterior Q waves, possibly due to LVH Interpretation limited secondary to artifact difficult to interpret Confirmed by Ripley Fraise [phone]) on 08/28/2017 11:42:15 PM       EKG Interpretation  Date/Time:  Friday August 29 2017 00:25:35 EDT Ventricular Rate:  90 PR Interval:    QRS Duration: 78 QT Interval:  387 QTC Calculation: 474 R Axis:   77 Text Interpretation:  Sinus rhythm Biatrial enlargement Probable left ventricular hypertrophy Anterior Q waves, possibly due to LVH Nonspecific T abnormalities, lateral leads No significant change since last tracing Confirmed by Ripley Fraise ([id number]) on 08/29/2017 12:28:00 AM       Radiology Dg Chest Port 1 View  Result Date: 08/29/2017 CLINICAL DATA:  Shortness of breath. EXAM: PORTABLE CHEST 1 VIEW COMPARISON:  Radiographs 07/10/2017 FINDINGS: Again seen hyperinflation and emphysema. Chronic bronchial thickening without progression. Mild scarring in the left midlung. Normal heart size and mediastinal contours. No consolidation, pulmonary edema, pneumothorax or pleural effusion. No acute osseous abnormalities. IMPRESSION: Hyperinflation emphysema consistent with COPD. No change from prior exam or acute abnormality. Electronically Signed   By: Jeb Levering M.D.   On: 08/29/2017 00:15    Procedures Procedures  CRITICAL CARE Performed by: Sharyon Cable Total critical care time: 40 minutes Critical care time was exclusive of separately billable procedures and treating other patients. Critical care was necessary to treat or prevent imminent or life-threatening deterioration. Critical care was time spent personally by me on the following activities: development of treatment plan with patient  and/or surrogate as well as nursing, discussions with consultants, evaluation of patient's response to treatment, examination of patient, obtaining history from patient or surrogate, ordering and performing treatments and interventions, ordering and review of laboratory studies, ordering and review of radiographic studies, pulse oximetry and re-evaluation of patient's condition. PATIENT WITH COPD EXACERBATION REQUIRING MULTIPLE NEB TREATMENTS AND ADMISSION  Medications Ordered in ED Medications  albuterol (PROVENTIL,VENTOLIN) solution continuous neb (10 mg/hr Nebulization Given 08/29/17 0001)  ipratropium (ATROVENT) nebulizer solution 0.5 mg (0.5 mg Nebulization Given 08/29/17 0211)  albuterol (PROVENTIL) (2.5 MG/3ML) 0.083% nebulizer solution 2.5 mg (2.5 mg Nebulization Given 08/29/17 0211)     Initial Impression / Assessment and Plan / ED Course  I have reviewed the triage vital signs and the nursing notes.  Pertinent labs & imaging results that were available during my care of the patient were reviewed by me and considered in my medical decision making (see chart for details).     12:06 AM Pt with worsening COPD exacerbation He received multiple meds en route Will need hour long albuterol and reassess 1:12 AM Pt appears improved Continue to monitor 3:53 AM  PT DOES NOT FEEL BACK TO BASELINE OR ABLE TO AMBULATE WILL ADMIT D/W DR Alcario Drought FOR ADMISSION FOR COPD EXACERBATION   Final Clinical Impressions(s) / ED Diagnoses   Final diagnoses:  SOB (shortness of breath)  COPD exacerbation (HCC)  Acute respiratory failure with hypoxia (HCC)    New Prescriptions New Prescriptions   No medications on file     Ripley Fraise, MD 08/29/17 [phone]

## 2017-08-30 DIAGNOSIS — F1010 Alcohol abuse, uncomplicated: Secondary | ICD-10-CM

## 2017-08-30 DIAGNOSIS — Z720 Tobacco use: Secondary | ICD-10-CM

## 2017-08-30 MED ORDER — AZITHROMYCIN 250 MG PO TABS: 250.0000 mg | ORAL_TABLET | Freq: Every day | ORAL | 0 refills | Status: DC

## 2017-08-30 MED ORDER — PREDNISONE 20 MG PO TABS: 40.0000 mg | ORAL_TABLET | Freq: Every day | ORAL | Status: DC

## 2017-08-30 MED ORDER — PREDNISONE 50 MG PO TABS
60.0000 mg | ORAL_TABLET | Freq: Every day | ORAL | Status: DC
  Administered 2017-08-30: 09:00:00 60 mg via ORAL
  Filled 2017-08-30: qty 1

## 2017-08-30 MED ORDER — PREDNISONE 20 MG PO TABS: ORAL_TABLET | ORAL | 0 refills | Status: DC

## 2017-08-30 NOTE — Progress Notes (Signed)
SATURATION QUALIFICATIONS: (This note is used to comply with regulatory documentation for home oxygen)  Patient Saturations on Room Air at Rest = 90%  Patient Saturations on Room Air while Ambulating = 80%  Patient Saturations on 2 Liters of oxygen while Ambulating = 86%  Please briefly explain why patient needs home oxygen: Patient has Home 02 that he uses PRN

## 2017-08-30 NOTE — Discharge Summary (Signed)
Physician Discharge Summary  Tony Sullivan BDZ:[id number] DOB: 02-13-1961 DOA: 08/28/2017  PCP: Tony Drafts, FNP  Admit date: 08/28/2017 Discharge date: 08/30/2017  Time spent: 45 minutes  Recommendations for Outpatient Follow-up:  Patient will be discharged to home.  Patient will need to follow up with primary care provider within one week of discharge. Follow up with pulmonology, Dr. Melvyn Sullivan.  Patient should continue medications as prescribed.  Patient should follow a heart healthy diet. Use your oxygen with ambulation. Stop smoking.   Discharge Diagnoses:  COPD exacerbation  Essential hypertension Tobacco abuse Alcohol abuse  Discharge Condition: Stable  Diet recommendation: Regular  Filed Weights   08/28/17 2336  Weight: 70.3 kg (155 lb)    History of present illness:  On 08/29/2017 by Dr. Anastasia Sullivan is a 56 y.o. male with medical history significant of COPD, multiple admits for same, O2 dependent at baseline 2-3L as needed usually while sleeping he says.  Patient of Dr. Gustavus Sullivan.  Patient presents to the ED with c/o SOB, wheezing, chest tightness.  Symptoms onset yesterday.  Symptoms similar to all prior COPD exacerbations.  DOE.  Home meds not working.  He does admit to still smoking some "its hard to quit after 40 years".  Hospital Course:  COPD exacerbation  -Placed on solumedrol, azithromycin, neb treatments -patient states he is on oxygen as needed at home -he continues to smoke  -he states he also has a nebulizer with treatments at home -Patient follows with pulmonology, Dr. Melvyn Sullivan -Advised to stop smoking -will discharge with azithromycin and prednisone taper -monitored O2 saturations: Room air at rest: 90%, Room air with ambulation 80%, 2L O2 with ambulation 86% -patient needs to use 2-3L of home oxygen  -Continue symbicort, spiriva  Essential hypertension -Continue amlodipine, losartan/HCTZ  Tobacco abuse -Discussed smoking  cessation  Alcohol abuse -discussed cessation  Procedures: None  Consultations: None  Discharge Exam: Vitals:   08/30/17 0815 08/30/17 0910  BP:  125/88  Pulse:    Resp:    Temp:    SpO2: 95%    Feels breathing has improved. Denies chest pain, abdominal pain, nausea, vomiting, diarrhea, constipation, dizziness, or headache. Would like to go home.    General: Well developed, well nourished, NAD, appears stated age  28: NCAT, mucous membranes moist.  Cardiovascular: S1 S2 auscultated, RRR, no murmurs  Respiratory: Diminished breath sounds, no wheezing noted.   Abdomen: Soft, nontender, nondistended, + bowel sounds  Extremities: warm dry without cyanosis clubbing or edema  Neuro: AAOx3, nonfocal  Psych: Appropriate mood and affect  Discharge Instructions Discharge Instructions    Discharge instructions    Complete by:  As directed    Patient will be discharged to home.  Patient will need to follow up with primary care provider within one week of discharge. Follow up with pulmonology, Dr. Melvyn Sullivan.  Patient should continue medications as prescribed.  Patient should follow a heart healthy diet. Use your oxygen with ambulation. Stop smoking.     Current Discharge Medication List    START taking these medications   Details  azithromycin (ZITHROMAX) 250 MG tablet Take 1 tablet (250 mg total) by mouth daily. Qty: 4 tablet, Refills: 0    predniSONE (DELTASONE) 20 MG tablet Take 3 tabs x 2 days, then 2 tabs x 2 days, then 1 tabs x 2 days, then resume your previous dose Qty: 12 tablet, Refills: 0      CONTINUE these medications which have NOT CHANGED  Details  albuterol (PROAIR HFA) 108 (90 Base) MCG/ACT inhaler 2 puffs up to every 4 hours if needed for short of breath Qty: 1 Inhaler, Refills: 11    albuterol (PROVENTIL) (2.5 MG/3ML) 0.083% nebulizer solution Take 2.5 mg by nebulization every 4 (four) hours as needed for wheezing or shortness of breath.      amLODipine (NORVASC) 10 MG tablet Take 10 mg by mouth every morning.     benztropine (COGENTIN) 0.5 MG tablet Take 0.5 mg by mouth at bedtime.     budesonide-formoterol (SYMBICORT) 160-4.5 MCG/ACT inhaler INHALE TWO PUFFS INTO THE LUNGS TWO (TWO) TIMES DAILY. Qty: 1 Inhaler, Refills: 11    cetirizine (ZYRTEC) 10 MG tablet Take 10 mg by mouth daily.    losartan-hydrochlorothiazide (HYZAAR) 100-12.5 MG tablet Take 1 tablet by mouth every morning.    loxapine (LOXITANE) 10 MG capsule Take 10 mg by mouth at bedtime.     Multiple Vitamin (MULTIVITAMIN) capsule Take 1 capsule by mouth daily.    risperiDONE (RISPERDAL) 2 MG tablet Take 2 mg by mouth at bedtime.    Tiotropium Bromide Monohydrate (SPIRIVA RESPIMAT) 2.5 MCG/ACT AERS Inhale 2 puffs into the lungs daily. Qty: 2 Inhaler, Refills: 0    traMADol (ULTRAM) 50 MG tablet Take 100 mg by mouth every 6 (six) hours as needed for moderate pain.    OXYGEN Inhale 2 L into the lungs as directed. 2-3 liters with sleep and exertion as needed for low oxygen    Respiratory Therapy Supplies (FLUTTER) DEVI Use as directed Qty: 1 each, Refills: 0       Allergies  Allergen Reactions  . Penicillins Anaphylaxis    Has patient had a PCN reaction causing immediate rash, facial/tongue/throat swelling, SOB or lightheadedness with hypotension: Yes Has patient had a PCN reaction causing severe rash involving mucus membranes or skin necrosis: No Has patient had a PCN reaction that required hospitalization No Has patient had a PCN reaction occurring within the last 10 years: No If all of the above answers are "NO", then may proceed with Cephalosporin use.   . Sulfa Antibiotics Swelling    Swelling of hands and throat   . Famotidine Other (See Comments)    Bloating of stomach, loss of appetite  . Levocetirizine Other (See Comments)    Muscle cramps   Follow-up Information    Tony Drafts, FNP. Schedule an appointment as soon as possible for  a visit.   Specialty:  Nurse Practitioner Why:  Hospital follow up Contact information: [place] [address] [phone]        [name], MD. Schedule an appointment as soon as possible for a visit in 3 week(s).   Specialty:  Pulmonary Disease Why:  Hospital follow up  Contact information: [address] [place] [address] [phone]            The results of significant diagnostics from this hospitalization (including imaging, microbiology, ancillary and laboratory) are listed below for reference.    Significant Diagnostic Studies: Dg Chest Port 1 View  Result Date: 08/29/2017 CLINICAL DATA:  Shortness of breath. EXAM: PORTABLE CHEST 1 VIEW COMPARISON:  Radiographs 07/10/2017 FINDINGS: Again seen hyperinflation and emphysema. Chronic bronchial thickening without progression. Mild scarring in the left midlung. Normal heart size and mediastinal contours. No consolidation, pulmonary edema, pneumothorax or pleural effusion. No acute osseous abnormalities. IMPRESSION: Hyperinflation emphysema consistent with COPD. No change from prior exam or acute abnormality. Electronically Signed  By: Jeb Levering M.D.   On: 08/29/2017 00:15    Microbiology: No results found for this or any previous visit (from the past 240 hour(s)).   Labs: Basic Metabolic Panel:  Recent Labs Lab 08/28/17 2348  NA 139  K 3.6  CL 103  CO2 27  GLUCOSE 113*  BUN 11  CREATININE 1.01  CALCIUM 8.9   Liver Function Tests: No results for input(s): AST, ALT, ALKPHOS, BILITOT, PROT, ALBUMIN in the last 168 hours. No results for input(s): LIPASE, AMYLASE in the last 168 hours. No results for input(s): AMMONIA in the last 168 hours. CBC:  Recent Labs Lab 08/28/17 2348 08/29/17 0811  WBC 20.8* 10.1  HGB 17.3* 15.2  HCT 50.3 44.8  MCV 98.4 97.8  PLT 286 277   Cardiac Enzymes: No results for input(s): CKTOTAL, CKMB, CKMBINDEX, TROPONINI in the last  168 hours. BNP: BNP (last 3 results) No results for input(s): BNP in the last 8760 hours.  ProBNP (last 3 results) No results for input(s): PROBNP in the last 8760 hours.  CBG: No results for input(s): GLUCAP in the last 168 hours.     SignedCristal Ford  [facility] 08/30/2017, 10:42 AM

## 2017-08-30 NOTE — Progress Notes (Signed)
Nsg Discharge Note  Admit Date:  08/28/2017 Discharge date: 08/30/2017   Tony Sullivan to be D/C'd Home per MD order.  AVS completed.  Copy for chart, and copy for patient signed, and dated. Patient/caregiver able to verbalize understanding.  Discharge Medication: Allergies as of 08/30/2017      Reactions   Penicillins Anaphylaxis   Has patient had a PCN reaction causing immediate rash, facial/tongue/throat swelling, SOB or lightheadedness with hypotension: Yes Has patient had a PCN reaction causing severe rash involving mucus membranes or skin necrosis: No Has patient had a PCN reaction that required hospitalization No Has patient had a PCN reaction occurring within the last 10 years: No If all of the above answers are "NO", then may proceed with Cephalosporin use.   Sulfa Antibiotics Swelling   Swelling of hands and throat    Famotidine Other (See Comments)   Bloating of stomach, loss of appetite   Levocetirizine Other (See Comments)   Muscle cramps      Medication List    TAKE these medications   albuterol (2.5 MG/3ML) 0.083% nebulizer solution Commonly known as:  PROVENTIL Take 2.5 mg by nebulization every 4 (four) hours as needed for wheezing or shortness of breath. What changed:  Another medication with the same name was changed. Make sure you understand how and when to take each.   albuterol 108 (90 Base) MCG/ACT inhaler Commonly known as:  PROAIR HFA 2 puffs up to every 4 hours if needed for short of breath What changed:  how much to take  how to take this  when to take this  reasons to take this  additional instructions   amLODipine 10 MG tablet Commonly known as:  NORVASC Take 10 mg by mouth every morning.   azithromycin 250 MG tablet Commonly known as:  ZITHROMAX Take 1 tablet (250 mg total) by mouth daily.   benztropine 0.5 MG tablet Commonly known as:  COGENTIN Take 0.5 mg by mouth at bedtime.   budesonide-formoterol 160-4.5 MCG/ACT  inhaler Commonly known as:  SYMBICORT INHALE TWO PUFFS INTO THE LUNGS TWO (TWO) TIMES DAILY.   cetirizine 10 MG tablet Commonly known as:  ZYRTEC Take 10 mg by mouth daily.   FLUTTER Devi Use as directed   losartan-hydrochlorothiazide 100-12.5 MG tablet Commonly known as:  HYZAAR Take 1 tablet by mouth every morning.   loxapine 10 MG capsule Commonly known as:  LOXITANE Take 10 mg by mouth at bedtime.   multivitamin capsule Take 1 capsule by mouth daily.   OXYGEN Inhale 2 L into the lungs as directed. 2-3 liters with sleep and exertion as needed for low oxygen   predniSONE 20 MG tablet Commonly known as:  DELTASONE Take 3 tabs x 2 days, then 2 tabs x 2 days, then 1 tabs x 2 days, then resume your previous dose   risperiDONE 2 MG tablet Commonly known as:  RISPERDAL Take 2 mg by mouth at bedtime.   Tiotropium Bromide Monohydrate 2.5 MCG/ACT Aers Commonly known as:  SPIRIVA RESPIMAT Inhale 2 puffs into the lungs daily.   traMADol 50 MG tablet Commonly known as:  ULTRAM Take 100 mg by mouth every 6 (six) hours as needed for moderate pain.       Discharge Assessment: Vitals:   08/30/17 0815 08/30/17 0910  BP:  125/88  Pulse:    Resp:    Temp:    SpO2: 95%    Skin clean, dry and intact without evidence of skin break down,  no evidence of skin tears noted. IV catheter discontinued intact. Site without signs and symptoms of complications - no redness or edema noted at insertion site, patient denies c/o pain - only slight tenderness at site.  Dressing with slight pressure applied.  D/c Instructions-Education: Discharge instructions given to patient/family with verbalized understanding. D/c education completed with patient/family including follow up instructions, medication list, d/c activities limitations if indicated, with other d/c instructions as indicated by MD - patient able to verbalize understanding, all questions fully answered. Patient instructed to return to  ED, call 911, or call MD for any changes in condition.  Patient escorted via [place], and D/C home via private auto.  Salley Slaughter, RN 08/30/2017 12:04 PM

## 2017-08-30 NOTE — Discharge Instructions (Signed)

## 2017-09-16 ENCOUNTER — Other Ambulatory Visit: Payer: Self-pay | Admitting: Internal Medicine

## 2017-10-09 ENCOUNTER — Ambulatory Visit: Payer: Medicaid Other | Admitting: Internal Medicine

## 2017-10-20 ENCOUNTER — Other Ambulatory Visit: Payer: Self-pay | Admitting: Internal Medicine

## 2017-10-20 NOTE — Telephone Encounter (Signed)
Pt is calling about getting a refill of prednisone [place]. [phone]

## 2017-11-03 ENCOUNTER — Ambulatory Visit (INDEPENDENT_AMBULATORY_CARE_PROVIDER_SITE_OTHER): Payer: Medicaid Other | Admitting: Internal Medicine

## 2017-11-03 ENCOUNTER — Encounter: Payer: Self-pay | Admitting: Internal Medicine

## 2017-11-03 VITALS — BP 110/70 | HR 84 | Ht 71.0 in | Wt 166.0 lb

## 2017-11-03 DIAGNOSIS — K089 Disorder of teeth and supporting structures, unspecified: Secondary | ICD-10-CM

## 2017-11-03 DIAGNOSIS — J449 Chronic obstructive pulmonary disease, unspecified: Secondary | ICD-10-CM

## 2017-11-03 DIAGNOSIS — J9611 Chronic respiratory failure with hypoxia: Secondary | ICD-10-CM

## 2017-11-03 DIAGNOSIS — F17210 Nicotine dependence, cigarettes, uncomplicated: Secondary | ICD-10-CM | POA: Diagnosis not present

## 2017-11-03 MED ORDER — AZITHROMYCIN 250 MG PO TABS: 250.0000 mg | ORAL_TABLET | ORAL | 0 refills | Status: DC

## 2017-11-03 MED ORDER — ACLIDINIUM BROMIDE 400 MCG/ACT IN AEPB: 1.0000 | INHALATION_SPRAY | Freq: Two times a day (BID) | RESPIRATORY_TRACT | 11 refills | Status: DC

## 2017-11-03 MED ORDER — ACLIDINIUM BROMIDE 400 MCG/ACT IN AEPB: 1.0000 | INHALATION_SPRAY | Freq: Two times a day (BID) | RESPIRATORY_TRACT | 0 refills | Status: DC

## 2017-11-03 MED ORDER — PREDNISONE 10 MG PO TABS: ORAL_TABLET | ORAL | 0 refills | Status: DC

## 2017-11-03 NOTE — Progress Notes (Signed)
Subjective:   Patient ID: Tony Sullivan, male    DOB: 1961-06-16  MRN: [id number]    Brief patient profile:  56  yobm quit smoking 03/17/16 with breathing difficulties since 2004 dx as copd on advair 250 /50 but getting worse since 2014 and placed on 02 for the first time in March 2015 and referred 01/07/2014 to pulmonary clinic by Dr Kennon Holter with GOLD II copd criteria established 02/14/14    History of Present Illness  01/07/2014 1st [facility] office visit/ Joan Avetisyan  Chief Complaint  Patient presents with  . Advice Only    Referred for COPD, SOB.  Pt c/o SOB, low 02, prod cough with white mucous.  Pt forgot med list, unsure of what all he is taking  still can do a [employer] but uses [facility] parking, assoc with congested coughing esp in am worse in winter. rec Stop advair   Plan A =  Automatic =  symbicort 160 Take 2 puffs first thing in am and then another 2 puffs about 12 hours later. spiriva in am only  Plan B = Backup- Proventil use it only if you can't your breath - ok to use up to every 4 hours if needed Plan C = Nebulizer up to every 4 hours if needed only if you try plan B first and it doesn't work     02/25/2017  f/u ov/Ladarren Steiner re:   GOLD II/ quit smoking as of 11/2016 on sym 160 2bid/ tudorza and 02 2lpm  Chief Complaint  Patient presents with  . Follow-up    Breathing has been worse. He lost his albuterol inhaler. He has been using albuterol neb every am.  He feels that his breathing is worse first thing in the am. He has to sleep propped up due to SOB when he lies down flat.   confused with instructions/ still dep on neb and off prednisone / no med calendar  Does respond to prednisone each time he takes a course with less sob/less saba need  rec Prednisone 10 mg x 2 daily until better nad not having to use your nebulizer each am then reduce to 10 mg daily     NP  03/27/17 ov  Continue on Symbicort 2 puffs Twice daily  , rinse after use.  Continue on Tudorza 1 puff Twice  daily  , rinse after use.  Follow med calendar closely and bring to each visit.  Follow up Dr. [name]  In  6-8 weeks and As needed    Admit date: 03/29/2017 Discharge date: 03/30/2017  Discharge Diagnoses:  Principal Problem:   Acute respiratory failure with hypoxia and hypercapnia ([place])  Active Problems:   COPD  GOLD II   COPD with acute exacerbation ([place])   Essential hypertension   ETOH abuse   Tobacco abuse  Brief narrative/history of present illness 57 year old male with COPD on home O2 (only uses as needed, on chronic oral prednisone), ongoing heavy smoking and alcohol use presented with one-day history of shortness of breath and chest tightness. He was requiring 5 L oxygen at home Patient did not take his medications on the day of admission as he was intoxicated with alcohol. Patient was seen by his pulmonologist on 5/31 and treated for acute COPD flare. Patient was given albuterol and Atrovent nebs and 125 mg IV Solu-Medrol and out to the ED with some improvement. ABG done in the ED showed respiratory acidosis with hypercapnia. He refused BiPAP. Admitted to telemetry.  Hospital course  Principal Problem: Acute respiratory failure with hypoxia and hypercapnia (HCC) Secondary to acute COPD exacerbation. Placed on IV Solu-Medrol scheduled DuoNeb's, supportive care with antitussives. Empiric Levaquin. Clinically improved this morning. O2 sat >90% on room air. He'll need to continue using home oxygen (at night and on ambulation) Patient will be discharged on oral prednisone taper over the next 10 days and then switch back to his home oral prednisone (10 mg daily). Will prescribe him oral Levaquin to complete 5 day course of antibiotics. Continue home inhalers and nebulizers. -patient should follow-up with his pulmonologist in 4 weeks.   Active Problems:  Alcohol abuse. Patient informs lately has been drinking heavily citing multiple celebrations at home. Reports having some  tremors on the days he doesn't drink. They seem on CIWA. No signs of withdrawal. Counseled strongly on cessation.   Essential hypertension amlodipine.  Ongoing tobacco use Counseled strongly on suggestions. Prescribe nicotine patch.      06/05/2017  f/u ov/Mao Lockner re:  Transition of care/ post hosp ov - no med calendar/ still on 20 mg prednisone  Supposed to be on 24h 02 but not using this way  Chief Complaint  Patient presents with  . Follow-up    Increased cough x 3-4 days- prod with minimal clear sputum. He has also noticed some increased SOB and lack of energy.  He is using his albuterol inhaler 2-3 x per day and neb with albuterol 2 x daily on average.   never got to point where was not over using saba and now gradually worse sob but still comfortable at rest Not clear he's using his action plan on med calendar  rec Change tudorza to spiriva respimat 2 pffs each am right after symbicort as your plan A   Plan B = Backup Only use your albuterol as a rescue medication Plan C = Crisis - only use your albuterol nebulizer if you first try Plan B and it fails to help > ok to use the nebulizer up to every 4 hours but if start needing it regularly call for immediate appointment Plan D = Deltasone= Prednisone  - continue 10 mg x 2 daily until better and not needing so much nebulizer then 1daily thereafter    11/03/2017  f/u ov/Stephan Nelis re:  GOLD II copd/ ab component still smoking / 02 hs  Chief Complaint  Patient presents with  . Follow-up    He has been coughing with light green sputum for the past wk. He also c/o some tightness in his chest and more SOB than usual for the past few days. He has been using his albuterol inhaler at least once per day and neb with albuterol 2 x per day.   no med calendar/ takes prednisone prn  On spiriva / confused with maint vs prn / formulary issues a challenge Worse sob/ cough and not following recs re pred as Plan D   No obvious day to day or daytime  variability or assoc   mucus plugs or hemoptysis or subjective wheeze or overt sinus or hb symptoms. No unusual exposure hx or h/o childhood pna/ asthma or knowledge of premature birth.  Sleeping ok flat without nocturnal  or early am exacerbation  of respiratory  c/o's or need for noct saba. Also denies any obvious fluctuation of symptoms with weather or environmental changes or other aggravating or alleviating factors except as outlined above   Current Allergies, Complete Past Medical History, Past Surgical History, Family History, and Social History were reviewed in  [facility] electronic medical record.  ROS  The following are not active complaints unless bolded Hoarseness, sore throat, dysphagia, dental problems, itching, sneezing,  nasal congestion or discharge of excess mucus or purulent secretions, ear ache,   fever, chills, sweats, unintended wt loss or wt gain, classically pleuritic or exertional cp,  orthopnea pnd or leg swelling, presyncope, palpitations, abdominal pain, anorexia, nausea, vomiting, diarrhea  or change in bowel habits or change in bladder habits, change in stools or change in urine, dysuria, hematuria,  rash, arthralgias, visual complaints, headache, numbness, weakness or ataxia or problems with walking or coordination,  change in mood/affect or memory.        Current Meds  Medication Sig  . albuterol (PROAIR HFA) 108 (90 Base) MCG/ACT inhaler TWO PUFFS UP TO EVERY 4 HOURS IF NEEDED FOR SHORT OF BREATH  . albuterol (PROVENTIL) (2.5 MG/3ML) 0.083% nebulizer solution Take 2.5 mg by nebulization every 4 (four) hours as needed for wheezing or shortness of breath.   Marland Kitchen amLODipine (NORVASC) 10 MG tablet Take 10 mg by mouth every morning.   . benztropine (COGENTIN) 0.5 MG tablet Take 0.5 mg by mouth at bedtime.   . budesonide-formoterol (SYMBICORT) 160-4.5 MCG/ACT inhaler INHALE TWO PUFFS INTO THE LUNGS TWO (TWO) TIMES DAILY.  . cetirizine (ZYRTEC) 10 MG tablet Take 10 mg by  mouth daily.  Marland Kitchen losartan-hydrochlorothiazide (HYZAAR) 100-12.5 MG tablet Take 1 tablet by mouth every morning.  . loxapine (LOXITANE) 10 MG capsule Take 10 mg by mouth at bedtime.   . Multiple Vitamin (MULTIVITAMIN) capsule Take 1 capsule by mouth daily.  . OXYGEN Inhale 2 L into the lungs as directed. 2-3 liters with sleep and exertion as needed for low oxygen  . Respiratory Therapy Supplies (FLUTTER) DEVI Use as directed  . risperiDONE (RISPERDAL) 2 MG tablet Take 2 mg by mouth at bedtime.  . traMADol (ULTRAM) 50 MG tablet Take 100 mg by mouth every 6 (six) hours as needed for moderate pain.  . [  Tiotropium Bromide Monohydrate (SPIRIVA RESPIMAT) 2.5 MCG/ACT AERS Inhale 2 puffs into the lungs daily.                         Objective:   Physical Exam  amb mod hoarse bm nad   06/15/2014  164  >  09/15/2014 158 >   02/20/2015 >156 03/22/2015 >  07/31/2015   150 > 12/05/2015  161   > 04/01/2016    151 > 07/29/2016  152 >   02/25/2017  153 >  06/05/2017  153 > 11/03/2017   166   Vital signs reviewed - Note on arrival 02 sats  94% on RA   HEENT: nl   turbinates bilaterally, and oropharynx. Nl external ear canals without cough reflex - poor dentition with "oyster" L top molar    NECK :  without JVD/Nodes/TM/ nl carotid upstrokes bilaterally   LUNGS: no acc muscle use,  Barrel chest/ distant bs bilaterally with mid exp wheezes bilaterally   CV:  RRR  no s3 or murmur or increase in P2, and no edema   ABD:  soft and nontender with pos mid insp hoover's in the supine position. No bruits or organomegaly appreciated, bowel sounds nl  MS:  Nl gait/ ext warm without deformities, calf tenderness, cyanosis or clubbing No obvious joint restrictions   SKIN: warm and dry without lesions    NEURO:  alert, approp, nl sensorium with  no motor or cerebellar  deficits apparent.

## 2017-11-03 NOTE — Patient Instructions (Addendum)
zpak   Prednisone 10 mg x 2 each am until better then 10 mg daily   Change spiriva to tudorza one puff twice daily    See Tammy NP 4  weeks with all your medications, even over the counter meds, separated in two separate bags, the ones you take no matter what vs the ones you stop once you feel better and take only as needed when you feel you need them.   Tammy  will generate for you a new user friendly medication calendar that will put [place] all on the same page re: your medication use.     Without this process, it simply isn't possible to assure that we are providing  your outpatient care  with  the attention to detail we feel you deserve.   If we cannot assure that you're getting that kind of care,  then we cannot manage your problem effectively from this clinic.  Once you have seen Tammy and we are sure that we're all on the same page with your medication use she will arrange follow up with me.

## 2017-11-04 ENCOUNTER — Encounter: Payer: Self-pay | Admitting: Internal Medicine

## 2017-11-04 DIAGNOSIS — K089 Disorder of teeth and supporting structures, unspecified: Secondary | ICD-10-CM | POA: Insufficient documentation

## 2017-11-04 NOTE — Assessment & Plan Note (Signed)

## 2017-11-04 NOTE — Assessment & Plan Note (Addendum)
- 02/14/2014   try symbicort 160 2bid  - PFT's  02/14/2014   FEV1 1.82 (53%) and ratio 47 no better p alb (p spiriva and symbicort 160)  and DLCO 32 with 37%  07/2014 >Spiriva stopped  09/15/2014 Med calendar> not using 02/20/2015   - Declined rehab 07/31/15  - Quit smoking 11/2016  - flutter valve 04/01/2016  -med calendar 08/20/16  - added prn pred to action plan 12/05/2016    - changed to maint pred 02/25/2017 with floor of 10 mg daily >>>  - 06/05/2017  After extensive coaching device effectiveness =    90% smi > change tudorza to spiriva 2.5 x 2 > unable to afford - 11/03/2017  After extensive coaching inhaler device  effectiveness =    90% with DPI/ tudorza    DDX of  difficult airways management almost all start with A and  include Adherence, Ace Inhibitors, Acid Reflux, Active Sinus Disease, Alpha 1 Antitripsin deficiency, Anxiety masquerading as Airways dz,  ABPA,  Allergy(esp in young), Aspiration (esp in elderly), Adverse effects of meds,  Active smokers, A bunch of PE's (a small clot burden can't cause this syndrome unless there is already severe underlying pulm or vascular dz with poor reserve) plus two Bs  = Bronchiectasis and Beta blocker use..and one C= CHF   Adherence is always the initial "prime suspect" and is a multilayered concern that requires a "trust but verify" approach in every patient - starting with knowing how to use medications, especially inhalers, correctly, keeping up with refills and understanding the fundamental difference between maintenance and prns vs those medications only taken for a very short course and then stopped and not refilled.  - see device teaching - Formulary restrictions will be an ongoing challenge for the forseable future and I would be happy to pick an alternative if the pt will first  provide me a list of them but pt  will need to return here for training for any new device that is required eg dpi vs hfa vs respimat.   In meantime we can always provide  samples so the patient never runs out of any needed respiratory medications.  - return with all meds in hand using a trust but verify approach to confirm accurate Medication  Reconciliation The principal here is that until we are certain that the  patients are doing what we've asked, it makes no sense to ask them to do more.  - rec return in 4 weeks for new med calendar   Active smoking (see separate a/p)   ? Allergy /asthma component > The goal with a chronic steroid dependent illness is always arriving at the lowest effective dose that controls the disease/symptoms and not accepting a set "formula" which is based on statistics or guidelines that don't always take into account patient  variability or the natural hx of the dz in every individual patient, which may well vary over time.  For now therefore I recommend the patient maintain  Ceiling of 20 mg per day and floor of 10 mg  ? Anxiety > usually at the bottom of this list of usual suspects but should be much higher on this pt's based on H and P and note already on psychotropics / may be interfering with ability to follow instructions/ stop smoking    ? Active sinusitis/ rhinitis > bronchitis > doubt/ rx zpak only for now    ? Bronchiectasis component > reviewed use of flutter    I had  an extended discussion with the patient reviewing all relevant studies completed to date and  lasting 15 to 20 minutes of a 25 minute visit    Each maintenance medication was reviewed in detail including most importantly the difference between maintenance and prns and under what circumstances the prns are to be triggered using an action plan format that is not reflected in the computer generated alphabetically organized AVS.    Please see AVS for specific instructions unique to this visit that I personally wrote and verbalized to the the pt in detail and then reviewed with pt  by my nurse highlighting any  changes in therapy recommended at today's visit to  their plan of care.

## 2017-11-04 NOTE — Assessment & Plan Note (Signed)
Advised to see dentist asap with risk of asp noct, esp if using etoh also

## 2017-11-04 NOTE — Assessment & Plan Note (Addendum)
-   started on 27 December 2013 by [facility] clinic - 02/14/2014  Walked RA x 3/4  laps @ 185 ft each stopped due to  desat 85% and corrected on 3lpm to x 2 laps  - 06/15/2014  Walked 2.5lpm  2 laps  @ 185 ft each stopped due to  Sob adequate sats  - 02/20/2015  Walked RA x 3 laps @ 185 ft each stopped due to  Sob/desat p 2 laps, nl pace, eliminated on his 02 at 3lpm  - 12/05/2015   Walked RA  2 laps @ 185 ft each stopped due to  desat to 88% and corrected on 2lpm   rec as of 11/03/2017 =  2lpm  Sleep  And prn with activity   Advised of dangers of smoking and use of 02

## 2017-11-12 ENCOUNTER — Telehealth: Payer: Self-pay | Admitting: Internal Medicine

## 2017-11-12 NOTE — Telephone Encounter (Signed)
Left message for Tony Sullivan to call back.      "- started on 27 December 2013 by [facility] clinic - 02/14/2014  Walked RA x 3/4  laps @ 185 ft each stopped due to  desat 85% and corrected on 3lpm to x 2 laps  - 06/15/2014  Walked 2.5lpm  2 laps  @ 185 ft each stopped due to  Sob adequate sats  - 02/20/2015  Walked RA x 3 laps @ 185 ft each stopped due to  Sob/desat p 2 laps, nl pace, eliminated on his 02 at 3lpm  - 12/05/2015   Walked RA  2 laps @ 185 ft each stopped due to  desat to 88% and corrected on 2lpm    rec as of 11/03/2017 =  2lpm  Sleep  And prn with activity    Advised of dangers of smoking and use of 02"

## 2017-11-13 NOTE — Telephone Encounter (Signed)
lmtcb x2 for Tony Sullivan.

## 2017-11-14 NOTE — Telephone Encounter (Signed)
lmtcb x3 for Tony Sullivan.

## 2017-11-25 ENCOUNTER — Other Ambulatory Visit: Payer: Self-pay | Admitting: Internal Medicine

## 2017-11-26 ENCOUNTER — Telehealth: Payer: Self-pay | Admitting: Internal Medicine

## 2017-11-26 NOTE — Telephone Encounter (Signed)
Sheena with [facility] calling again from 1.16.19 phone note Per Vita Barley, the last documentation they have from August 2018 shows that pt did not qualify for home O2 Sheena asking if more recent documentation has been obtained >> nothing within the last 30 days  Pt last seen 1.7.19 by MW with recs to continue O2 2lpm at bedtime and prn w/ activity: Chronic respiratory failure with hypoxia ([place]) - [name], MD at 11/04/2017  8:50 AM  Status: Alison Stalling  Related Problem: Chronic respiratory failure with hypoxia ([place])    - started on 27 December 2013 by [facility] clinic - 02/14/2014  Walked RA x 3/4  laps @ 185 ft each stopped due to  desat 85% and corrected on 3lpm to x 2 laps  - 06/15/2014  Walked 2.5lpm  2 laps  @ 185 ft each stopped due to  Sob adequate sats  - 02/20/2015  Walked RA x 3 laps @ 185 ft each stopped due to  Sob/desat p 2 laps, nl pace, eliminated on his 02 at 3lpm  - 12/05/2015   Walked RA  2 laps @ 185 ft each stopped due to  desat to 88% and corrected on 2lpm    rec as of 11/03/2017 =  2lpm  Sleep  And prn with activity    Advised of dangers of smoking and use of 02       Advised Sheena that patient is scheduled to see TP on 2.5.19 and pt can be requalified at that time with ONO on room air ordered to see if pt qualifies for nocturnal O2 as well.  Sheena okay with waiting for that ov.  Her call back number is [phone] (per Sheena no need to wait for the prompts, may dial ext as soon as the line picks up)  Routing to myself and TP for the upcoming visit

## 2017-12-01 NOTE — Telephone Encounter (Signed)
Pt has appointment with TP on 12/02/17.

## 2017-12-02 ENCOUNTER — Encounter: Payer: Self-pay | Admitting: Adult Health

## 2017-12-02 ENCOUNTER — Ambulatory Visit (INDEPENDENT_AMBULATORY_CARE_PROVIDER_SITE_OTHER): Payer: Medicaid Other | Admitting: Adult Health

## 2017-12-02 VITALS — BP 108/66 | HR 68 | Ht 71.0 in | Wt 167.4 lb

## 2017-12-02 DIAGNOSIS — F17200 Nicotine dependence, unspecified, uncomplicated: Secondary | ICD-10-CM | POA: Diagnosis not present

## 2017-12-02 DIAGNOSIS — J449 Chronic obstructive pulmonary disease, unspecified: Secondary | ICD-10-CM

## 2017-12-02 DIAGNOSIS — J9611 Chronic respiratory failure with hypoxia: Secondary | ICD-10-CM

## 2017-12-02 MED ORDER — BUDESONIDE-FORMOTEROL FUMARATE 160-4.5 MCG/ACT IN AERO: 2.0000 | INHALATION_SPRAY | Freq: Two times a day (BID) | RESPIRATORY_TRACT | 0 refills | Status: DC

## 2017-12-02 MED ORDER — PREDNISONE 10 MG PO TABS: ORAL_TABLET | ORAL | 3 refills | Status: DC

## 2017-12-02 MED ORDER — ACLIDINIUM BROMIDE 400 MCG/ACT IN AEPB: 1.0000 | INHALATION_SPRAY | Freq: Two times a day (BID) | RESPIRATORY_TRACT | 0 refills | Status: DC

## 2017-12-02 MED ORDER — BUDESONIDE-FORMOTEROL FUMARATE 160-4.5 MCG/ACT IN AERO: INHALATION_SPRAY | RESPIRATORY_TRACT | 5 refills | Status: DC

## 2017-12-02 NOTE — Assessment & Plan Note (Signed)
Smoking cessation  

## 2017-12-02 NOTE — Assessment & Plan Note (Signed)
Progressive COPD in active smoker ,now GOLD III COPD that is oxygen and steroid dependent Patient's medications were reviewed today and patient education was given. Computerized medication calendar was adjusted/completed    Plan  Patient Instructions  Continue on Symbicort 2 puffs Twice daily  , rinse after use.  Continue on Tudorza Twice daily  , rinse after use.  Refer to pulmonary rehab.  Continue on Prednisone , decrease 10mg  daily  . May take 2 tabs when breathing worsens , then back to 1 tab daily.  Continue on Oxygen 2l/m .  Work on not smoking .  Follow med calendar closely and bring to each visit.  Follow up Dr. [name]  In 3 months and As needed   Please contact office for sooner follow up if symptoms do not improve or worsen or seek emergency care

## 2017-12-02 NOTE — Assessment & Plan Note (Addendum)
Patient Saturations on Room Air at Rest = 87% Patient Saturations on [employer] while Ambulating = 85% Patient Saturations on 2 Liters of oxygen while Ambulating = 93%  Requalification for O2 , he feels he is benefitting from oxygen.   Cont on o2 .

## 2017-12-02 NOTE — Addendum Note (Signed)
Addended by: Parke Poisson E on: 12/02/2017 05:28 PM   Modules accepted: Orders

## 2017-12-02 NOTE — Progress Notes (Signed)
@Patient  ID: Tony Sullivan, male    DOB: Mar 06, 1961, 57 y.o.   MRN: [id number]  Chief Complaint  Patient presents with  . 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 O2 RF  Patient presents for a one-month follow-up.  Patient was seen last visit for recurrent COPD exacerbation.  He was given a Z-Pak.  Prednisone was increased to 20 mg until better then back to his baseline of 10 mg.  Patient was changed from Spiriva to [place] Twice daily  .  He says he is only minimally improved . Gets winded with minimal activity . Uses oxygen most of the day with activity . He is very sedentary . Does not exercise. No chest pain,orthopnea, edema or hemoptysis .  CXR 08/2017 with emphysema , stable chronic changes.  Spirometry today shows decline in lung fxn with FEV 1 at 33% .  Still smoking , cut back . Smoking cessation . Has cut back on etoh , 3 beers a day .     Allergies  Allergen Reactions  . Penicillins Anaphylaxis    Has patient had a PCN reaction causing immediate rash, facial/tongue/throat swelling, SOB or lightheadedness with hypotension: Yes Has patient had a PCN reaction causing severe rash involving mucus membranes or skin necrosis: No Has patient had a PCN reaction that required hospitalization No Has patient had a PCN reaction occurring within the last 10 years: No If all of the above answers are "NO", then may proceed with Cephalosporin use.   . Sulfa Antibiotics Swelling    Swelling of hands and throat   . Famotidine Other (See Comments)    Bloating of stomach, loss of appetite  . Levocetirizine Other (See Comments)    Muscle cramps     Immunization History  Administered Date(s) Administered  . Influenza Split 08/09/2013, 08/03/2014  . Influenza Whole 07/28/2017  . Influenza,inj,Quad PF,6+ Mos 07/31/2015, 07/29/2016  . Pneumococcal Conjugate-13 07/31/2015  . Pneumococcal Polysaccharide-23 08/03/2014    Past Medical History:  Diagnosis Date  . Asthma   . COPD (chronic obstructive pulmonary disease) ([place]) 2004   diagnosed in 2004, no PFT's to date.  Started on home O2 12/2013, after found to be desatting at PCP's office, and referred to pulmonology.  . High blood pressure   . Seasonal allergies     Tobacco History: Social History   Tobacco Use  Smoking Status Current Some Day Smoker  . Packs/day: 0.50  . Years: 36.00  . Pack years: 18.00  . Types: Cigarettes  Smokeless Tobacco Never Used   Ready to quit: No Counseling given: Yes   Outpatient Encounter Medications as of 12/02/2017  Medication Sig  . Aclidinium Bromide (TUDORZA PRESSAIR) 400 MCG/ACT AEPB Inhale 1 puff into the lungs 2 (two) times daily.  Marland Kitchen albuterol (PROAIR HFA) 108 (90 Base) MCG/ACT inhaler TWO PUFFS UP TO EVERY 4 HOURS IF NEEDED FOR SHORT OF BREATH  . albuterol (PROVENTIL) (2.5 MG/3ML) 0.083% nebulizer solution Take 2.5 mg by nebulization every 4 (four) hours as needed for wheezing or shortness of breath.   Marland Kitchen  amLODipine (NORVASC) 10 MG tablet Take 10 mg by mouth every morning.   . benztropine (COGENTIN) 0.5 MG tablet Take 0.5 mg by mouth at bedtime.   . budesonide-formoterol (SYMBICORT) 160-4.5 MCG/ACT inhaler INHALE TWO PUFFS INTO THE LUNGS TWO (TWO) TIMES DAILY.  . cetirizine (ZYRTEC) 10 MG tablet Take 10 mg by mouth daily.  Marland Kitchen losartan-hydrochlorothiazide (HYZAAR) 100-12.5 MG tablet Take 1 tablet by mouth every morning.  . loxapine (LOXITANE) 10 MG capsule Take 10 mg by mouth at bedtime.   . Multiple Vitamin (MULTIVITAMIN) capsule Take 1 capsule by mouth daily.  . OXYGEN Inhale 2 L into the lungs as directed. 2-3 liters with sleep and  exertion as needed for low oxygen  . predniSONE (DELTASONE) 10 MG tablet TAKE TWO DAILY UNTIL BETTER THEN 1 DAILY  . Respiratory Therapy Supplies (FLUTTER) DEVI Use as directed  . risperiDONE (RISPERDAL) 2 MG tablet Take 2 mg by mouth at bedtime.  . traMADol (ULTRAM) 50 MG tablet Take 100 mg by mouth every 6 (six) hours as needed for moderate pain.  . [DISCONTINUED] azithromycin (ZITHROMAX) 250 MG tablet Take 1 tablet (250 mg total) by mouth as directed.  . [DISCONTINUED] budesonide-formoterol (SYMBICORT) 160-4.5 MCG/ACT inhaler INHALE TWO PUFFS INTO THE LUNGS TWO (TWO) TIMES DAILY.  . [DISCONTINUED] predniSONE (DELTASONE) 10 MG tablet TAKE TWO DAILY UNTIL BETTER THEN 1 DAILY   No facility-administered encounter medications on file as of 12/02/2017.      Review of Systems  Constitutional:   No  weight loss, night sweats,  Fevers, chills, + fatigue, or  lassitude.  HEENT:   No headaches,  Difficulty swallowing,  Tooth/dental problems, or  Sore throat,                No sneezing, itching, ear ache,  +nasal congestion, post nasal drip,   CV:  No chest pain,  Orthopnea, PND, swelling in lower extremities, anasarca, dizziness, palpitations, syncope.   GI  No heartburn, indigestion, abdominal pain, nausea, vomiting, diarrhea, change in bowel habits, loss of appetite, bloody stools.   Resp:  .  No chest wall deformity  Skin: no rash or lesions.  GU: no dysuria, change in color of urine, no urgency or frequency.  No flank pain, no hematuria   MS:  No joint pain or swelling.  No decreased range of motion.  No back pain.    Physical Exam  BP 108/66 (BP Location: Left Arm, Cuff Size: Normal)   Pulse 68   Ht 5\' 11"  (1.803 m)   Wt 167 lb 6.4 oz (75.9 kg)   SpO2 92%   BMI 23.35 kg/m   GEN: A/Ox3; pleasant , NAD, thin male    HEENT:  [place]/AT,  EACs-clear, TMs-wnl, NOSE-clear, THROAT-clear, no lesions, no postnasal drip or exudate noted. Poor dentition   NECK:  Supple w/ fair ROM; no  JVD; normal carotid impulses w/o bruits; no thyromegaly or nodules palpated; no lymphadenopathy.    RESP  Clear  P & A; w/o, wheezes/ rales/ or rhonchi. no accessory muscle use, no dullness to percussion Decreased BS in bases   CARD:  RRR, no m/r/g, no peripheral edema, pulses intact, no cyanosis or clubbing.  GI:   Soft & nt; nml bowel sounds; no organomegaly or masses detected.   Musco: Warm bil, no deformities or joint swelling noted.   Neuro: alert, no focal deficits noted.    Skin: Warm, no lesions or rashes    Lab Results:  CBC  Imaging: No results  found.   Assessment & Plan:   COPD  GOLD II Progressive COPD in active smoker ,now GOLD III COPD that is oxygen and steroid dependent Patient's medications were reviewed today and patient education was given. Computerized medication calendar was adjusted/completed    Plan  Patient Instructions  Continue on Symbicort 2 puffs Twice daily  , rinse after use.  Continue on Tudorza Twice daily  , rinse after use.  Refer to pulmonary rehab.  Continue on Prednisone , decrease 10mg  daily  . May take 2 tabs when breathing worsens , then back to 1 tab daily.  Continue on Oxygen 2l/m .  Work on not smoking .  Follow med calendar closely and bring to each visit.  Follow up Dr. [name]  In 3 months and As needed   Please contact office for sooner follow up if symptoms do not improve or worsen or seek emergency care       Smoker Smoking cessation    Chronic respiratory failure with hypoxia ([place]) Cont on o2 .      Rexene Edison, NP 12/02/2017

## 2017-12-02 NOTE — Telephone Encounter (Signed)
Will route to JJ to make aware.

## 2017-12-02 NOTE — Patient Instructions (Addendum)
Continue on Symbicort 2 puffs Twice daily  , rinse after use.  Continue on Tudorza Twice daily  , rinse after use.  Refer to pulmonary rehab.  Continue on Prednisone , decrease 10mg  daily  . May take 2 tabs when breathing worsens , then back to 1 tab daily.  Continue on Oxygen 2l/m .  Work on not smoking .  Follow med calendar closely and bring to each visit.  Follow up Dr. [name]  In 3 months and As needed   Please contact office for sooner follow up if symptoms do not improve or worsen or seek emergency care

## 2017-12-03 NOTE — Addendum Note (Signed)
Addended by: Parke Poisson E on: 12/03/2017 11:58 AM   Modules accepted: Orders

## 2017-12-03 NOTE — Telephone Encounter (Signed)
O2 recert done at [facility] Routing to TP >> Sorry Tam, forgot to mention this to you yesterday.  Do you mind adding to your note that pt re-qualified and is benefiting from O2?  Thank you!

## 2017-12-04 NOTE — Telephone Encounter (Signed)
Done

## 2017-12-08 ENCOUNTER — Telehealth: Payer: Self-pay | Admitting: Adult Health

## 2017-12-08 NOTE — Telephone Encounter (Signed)
Records that were requested per Mel Almond I called and spoke to her were faxed to [phone]

## 2017-12-08 NOTE — Telephone Encounter (Signed)
Called and spoke with [employer], they are needing office notes from 2.5.19 for oxygen purposes. Unsure what all she was meaning. [facility] can you please look into this. Thanks!

## 2017-12-23 ENCOUNTER — Other Ambulatory Visit: Payer: Self-pay | Admitting: Internal Medicine

## 2017-12-23 ENCOUNTER — Encounter (HOSPITAL_COMMUNITY): Payer: Self-pay | Admitting: *Deleted

## 2017-12-23 NOTE — Progress Notes (Signed)
We received a order for the Pulmonary Undergraduate program for [name]. He had Medicaid which does not cover this program. I contacted him on 12/16/17 to discuss the Pulmonary Maintenance program with him which is a self pay program. He is interested in attending, but has to see if he can find a ride to get here on Tuesday and Thursday. [name] states that he would call me back to let me know if he can work out a plan to get here. I have not heard from him, so I attempted to contact him twice today and received no answer and I was not able to leave a message.

## 2017-12-26 ENCOUNTER — Encounter (HOSPITAL_COMMUNITY): Payer: Self-pay | Admitting: *Deleted

## 2017-12-26 NOTE — Progress Notes (Signed)
Pulmonary Rehab  I was able to contact Kamori today on his cell phone and left him a message. I have ask him to contact us regarding his decision on attending the Pulmonary Maintenance program. I left him our our contact number.

## 2017-12-30 ENCOUNTER — Encounter (HOSPITAL_COMMUNITY): Payer: Self-pay | Admitting: *Deleted

## 2017-12-30 NOTE — Progress Notes (Signed)
Pulmonary Rehab Tivis returned my telephone call. He has decided not to attend the Pulmonary Maintenance program due to his financial situation.He feels that he will not be able to afford the 60.00 per month. He also has no transportation. I have encouraged him to contact us if he feels that his situation has changed or that if we can be of any assistance to him. Balin has our contact number.I will no longer attempt to get him in the pulmonary rehab program.

## 2017-12-31 ENCOUNTER — Telehealth: Payer: Self-pay | Admitting: Internal Medicine

## 2017-12-31 DIAGNOSIS — J449 Chronic obstructive pulmonary disease, unspecified: Secondary | ICD-10-CM

## 2017-12-31 DIAGNOSIS — J9611 Chronic respiratory failure with hypoxia: Secondary | ICD-10-CM

## 2017-12-31 NOTE — Telephone Encounter (Signed)
lmtcb for pt. Will place order after speaking to pt to make aware of recs.

## 2017-12-31 NOTE — Telephone Encounter (Signed)
Needs case manager from [facility] to do home health assesment and start home PT

## 2017-12-31 NOTE — Telephone Encounter (Signed)
Called and spoke with patient, he states that he is now living alone and due to his health issues and age he can no longer do some things for himself. He is concerned that he will start to mess up his medications, forget to take them, or do something he shouldn't. Patient is asking that we get him in with a nursing home or home health to have someone with him when he takes his medications and someone that can help with his ADL's. MW please advise on this. Thanks.

## 2018-01-01 NOTE — Telephone Encounter (Signed)
lmtcb x2 for pt. 

## 2018-01-02 NOTE — Telephone Encounter (Signed)
ATC pt, no answer. Left message for pt to call back.  

## 2018-01-05 NOTE — Telephone Encounter (Signed)
Spoke with pt, he states he will contact us back when he figures out who he wants for service. He is researching places now and will make a decision and let us know. Nothing further is needed at this time.

## 2018-01-21 ENCOUNTER — Other Ambulatory Visit: Payer: Self-pay | Admitting: Internal Medicine

## 2018-01-21 MED ORDER — ALBUTEROL SULFATE HFA 108 (90 BASE) MCG/ACT IN AERS: INHALATION_SPRAY | RESPIRATORY_TRACT | 1 refills | Status: DC

## 2018-01-30 ENCOUNTER — Encounter: Payer: Self-pay | Admitting: Internal Medicine

## 2018-01-30 ENCOUNTER — Ambulatory Visit (INDEPENDENT_AMBULATORY_CARE_PROVIDER_SITE_OTHER): Payer: Medicaid Other | Admitting: Internal Medicine

## 2018-01-30 VITALS — BP 120/82 | HR 89 | Ht 71.0 in | Wt 167.0 lb

## 2018-01-30 DIAGNOSIS — J9611 Chronic respiratory failure with hypoxia: Secondary | ICD-10-CM | POA: Diagnosis not present

## 2018-01-30 DIAGNOSIS — J449 Chronic obstructive pulmonary disease, unspecified: Secondary | ICD-10-CM | POA: Diagnosis not present

## 2018-01-30 MED ORDER — FAMOTIDINE 20 MG PO TABS: ORAL_TABLET | ORAL | 11 refills | Status: DC

## 2018-01-30 NOTE — Patient Instructions (Signed)
Pepcid 20 mg one at bedtime   See calendar for specific medication instructions and bring it back for each and every office visit for every healthcare provider you see.  Without it,  you may not receive the best quality medical care that we feel you deserve.  You will note that the calendar groups together  your maintenance  medications that are timed at particular times of the day.  Think of this as your checklist for what your doctor has instructed you to do until your next evaluation to see what benefit  there is  to staying on a consistent group of medications intended to keep you well.  The other group at the bottom is entirely up to you to use as you see fit  for specific symptoms that may arise between visits that require you to treat them on an as needed basis.  Think of this as your action plan or "what if" list.   Separating the top medications from the bottom group is fundamental to providing you adequate care going forward.    Please schedule a follow up office visit in 4 weeks, sooner if needed

## 2018-01-30 NOTE — Progress Notes (Signed)
Subjective:   Patient ID: Tony Sullivan, male    DOB: [birth year]  MRN: [id number]    Brief patient profile:  56  yobm quit smoking 03/17/16 with breathing difficulties since 2004 dx as copd on advair 250 /50 but getting worse since 2014 and placed on 02 for the first time in March 2015 and referred 01/07/2014 to pulmonary clinic by Dr Tony Sullivan with GOLD II copd criteria established 02/14/14    History of Present Illness  01/07/2014 1st [facility] office visit/ Tony Sullivan  Chief Complaint  Patient presents with  . Advice Only    Referred for COPD, SOB.  Pt c/o SOB, low 02, prod cough with white mucous.  Pt forgot med list, unsure of what all he is taking  still can do a [employer] but uses [facility] parking, assoc with congested coughing esp in am worse in winter. rec Stop advair   Plan A =  Automatic =  symbicort 160 Take 2 puffs first thing in am and then another 2 puffs about 12 hours later. spiriva in am only  Plan B = Backup- Proventil use it only if you can't your breath - ok to use up to every 4 hours if needed Plan C = Nebulizer up to every 4 hours if needed only if you try plan B first and it doesn't work     02/25/2017  f/u ov/Tony Sullivan re:   GOLD II/ quit smoking as of 11/2016 on sym 160 2bid/ tudorza and 02 2lpm  Chief Complaint  Patient presents with  . Follow-up    Breathing has been worse. He lost his albuterol inhaler. He has been using albuterol neb every am.  He feels that his breathing is worse first thing in the am. He has to sleep propped up due to SOB when he lies down flat.   confused with instructions/ still dep on neb and off prednisone / no med calendar  Does respond to prednisone each time he takes a course with less sob/less saba need  rec Prednisone 10 mg x 2 daily until better nad not having to use your nebulizer each am then reduce to 10 mg daily     NP  03/27/17 ov  Continue on Symbicort 2 puffs Twice daily  , rinse after use.  Continue on Tudorza 1 puff Twice  daily  , rinse after use.  Follow med calendar closely and bring to each visit.  Follow up Dr. Melvyn Sullivan  In  6-8 weeks and As needed    Admit date: 03/29/2017 Discharge date: 03/30/2017  Discharge Diagnoses:  Principal Problem:   Acute respiratory failure with hypoxia and hypercapnia (Tony Sullivan)  Active Problems:   COPD  GOLD II   COPD with acute exacerbation ([place])   Essential hypertension   ETOH abuse   Tobacco abuse  Brief narrative/history of present illness 57 year old male with COPD on home O2 (only uses as needed, on chronic oral prednisone), ongoing heavy smoking and alcohol use presented with one-day history of shortness of breath and chest tightness. He was requiring 5 L oxygen at home Patient did not take his medications on the day of admission as he was intoxicated with alcohol. Patient was seen by his pulmonologist on 5/31 and treated for acute COPD flare. Patient was given albuterol and Atrovent nebs and 125 mg IV Solu-Medrol and out to the ED with some improvement. ABG done in the ED showed respiratory acidosis with hypercapnia. He refused BiPAP. Admitted to telemetry.  Hospital course  Principal Problem: Acute respiratory failure with hypoxia and hypercapnia ([facility]) Secondary to acute COPD exacerbation. Placed on IV Solu-Medrol scheduled DuoNeb's, supportive care with antitussives. Empiric Levaquin. Clinically improved this morning. O2 sat >90% on room air. He'll need to continue using home oxygen (at night and on ambulation) Patient will be discharged on oral prednisone taper over the next 10 days and then switch back to his home oral prednisone (10 mg daily). Will prescribe him oral Levaquin to complete 5 day course of antibiotics. Continue home inhalers and nebulizers. -patient should follow-up with his pulmonologist in 4 weeks.   Active Problems:  Alcohol abuse. Patient informs lately has been drinking heavily citing multiple celebrations at home. Reports having some  tremors on the days he doesn't drink. They seem on CIWA. No signs of withdrawal. Counseled strongly on cessation.   Essential hypertension amlodipine.  Ongoing tobacco use Counseled strongly on suggestions. Prescribe nicotine patch.      06/05/2017  f/u ov/Tony Sullivan re:  Transition of care/ post hosp ov - no med calendar/ still on 20 mg prednisone  Supposed to be on 24h 02 but not using this way  Chief Complaint  Patient presents with  . Follow-up    Increased cough x 3-4 days- prod with minimal clear sputum. He has also noticed some increased SOB and lack of energy.  He is using his albuterol inhaler 2-3 x per day and neb with albuterol 2 x daily on average.   never got to point where was not over using saba and now gradually worse sob but still comfortable at rest Not clear he's using his action plan on med calendar  rec Change tudorza to spiriva respimat 2 pffs each am right after symbicort as your plan A   Plan B = Backup Only use your albuterol as a rescue medication Plan C = Crisis - only use your albuterol nebulizer if you first try Plan B and it fails to help > ok to use the nebulizer up to every 4 hours but if start needing it regularly call for immediate appointment Plan D = Deltasone= Prednisone  - continue 10 mg x 2 daily until better and not needing so much nebulizer then 1daily thereafter    11/03/2017  f/u ov/Tony Sullivan re:  GOLD II copd/ ab component still smoking / 02 hs  Chief Complaint  Patient presents with  . Follow-up    He has been coughing with light green sputum for the past wk. He also c/o some tightness in his chest and more SOB than usual for the past few days. He has been using his albuterol inhaler at least once per day and neb with albuterol 2 x per day.   no med calendar/ takes prednisone prn  On spiriva / confused with maint vs prn / formulary issues a challenge Worse sob/ cough and not following recs re pred as rec zpak  Prednisone 10 mg x 2 each am  until better then 10 mg daily  Change spiriva to tudorza one puff twice daily     NP eval 12/02/17 Continue on Symbicort 2 puffs Twice daily  , rinse after use.  Continue on Tudorza Twice daily  , rinse after use.  Refer to pulmonary rehab.  Continue on Prednisone , decrease 10mg  daily  . May take 2 tabs when breathing worsens , then back to 1 tab daily.  Continue on Oxygen 2l/m .  Work on not smoking .  Follow med calendar closely and bring to each  visit.     01/30/2018  f/u ov/Giulian Goldring re: COPD GOLD II / anxiety  Chief Complaint  Patient presents with  . Follow-up    increased SOB x 1week,productive cough light green in color  Dyspnea:  X 50 ft on 2lpm  Cough: more freq x one week/ no pattern> has oxy but hasn't started yet  Sleep: on 02 2lpm @ 30 degrees and worse wheeze in am when rising SABA use:  saba hfa = neb  Including x 2 in am of ov   No obvious day to day or daytime variability or assoc excess/ purulent sputum or mucus plugs or hemoptysis or cp or chest tightness, subjective wheeze or overt sinus or hb symptoms. No unusual exposure hx or h/o childhood pna/ asthma or knowledge of premature birth.    Also denies any obvious fluctuation of symptoms with weather or environmental changes or other aggravating or alleviating factors except as outlined above   Current Allergies, Complete Past Medical History, Past Surgical History, Family History, and Social History were reviewed in [employer] record.  ROS  The following are not active complaints unless bolded Hoarseness, sore throat, dysphagia, dental problems, itching, sneezing,  nasal congestion or discharge of excess mucus or purulent secretions, ear ache,   fever, chills, sweats, unintended wt loss or wt gain, classically pleuritic or exertional cp,  orthopnea pnd or leg swelling, presyncope, palpitations, abdominal pain, anorexia, nausea, vomiting, diarrhea  or change in bowel habits or change in bladder  habits, change in stools or change in urine, dysuria, hematuria,  rash, arthralgias, visual complaints, headache, numbness, weakness or ataxia or problems with walking or coordination,  change in mood/affect or memory.        Current Meds  Medication Sig  . Aclidinium Bromide (TUDORZA PRESSAIR) 400 MCG/ACT AEPB Inhale 1 puff into the lungs 2 (two) times daily.  Marland Kitchen albuterol (PROAIR HFA) 108 (90 Base) MCG/ACT inhaler TWO PUFFS UP TO EVERY 4 HOURS IF NEEDED FOR SHORT OF BREATH  . albuterol (PROVENTIL) (2.5 MG/3ML) 0.083% nebulizer solution Take 2.5 mg by nebulization every 4 (four) hours as needed for wheezing or shortness of breath.   Marland Kitchen amLODipine (NORVASC) 10 MG tablet Take 10 mg by mouth every morning.   . benztropine (COGENTIN) 0.5 MG tablet Take 0.5 mg by mouth at bedtime.   . budesonide-formoterol (SYMBICORT) 160-4.5 MCG/ACT inhaler INHALE TWO PUFFS INTO THE LUNGS TWO (TWO) TIMES DAILY.  . cetirizine (ZYRTEC) 10 MG tablet Take 10 mg by mouth daily.  Marland Kitchen dextromethorphan-guaiFENesin (MUCINEX DM) 30-600 MG 12hr tablet Take 1 tablet by mouth 2 (two) times daily as needed (with flutter valve).  . fluticasone (FLONASE) 50 MCG/ACT nasal spray Place 2 sprays into both nostrils daily as needed for allergies or rhinitis.  Marland Kitchen losartan (COZAAR) 100 MG tablet Take 100 mg by mouth daily.  Marland Kitchen loxapine (LOXITANE) 10 MG capsule Take 10 mg by mouth at bedtime.   . Multiple Vitamin (MULTIVITAMIN) capsule Take 1 capsule by mouth daily.  Marland Kitchen omeprazole (PRILOSEC) 40 MG capsule Take 40 mg by mouth daily.  . OXYGEN Inhale 2 L into the lungs as directed. 2-3 liters with sleep and exertion as needed for low oxygen  . predniSONE (DELTASONE) 10 MG tablet TAKE TWO DAILY UNTIL BETTER THEN 1 DAILY  . Respiratory Therapy Supplies (FLUTTER) DEVI Use as directed  . risperiDONE (RISPERDAL) 2 MG tablet Take 2 mg by mouth at bedtime.  . traMADol (ULTRAM) 50 MG tablet Take 50 mg  by mouth 2 (two) times daily as needed for moderate  pain.   . TUDORZA PRESSAIR 400 MCG/ACT AEPB INHALE 1 PUFF INTO THE LUNGS TWO (TWO) TIMES DAILY.            Objective:   Physical Exam  Anxious amb bm nad   06/15/2014  164  >  09/15/2014 158 >   02/20/2015 >156 03/22/2015 >  07/31/2015   150 > 12/05/2015  161   > 04/01/2016    151 > 07/29/2016  152 >   02/25/2017  153 >  06/05/2017  153 > 11/03/2017   166 > 01/30/2018  167  Vital signs reviewed - Note on arrival 02 sats  92% on 2lpm continuous        HEENT: nl oropharynx. Nl external ear canals without cough reflex - moderate bilateral non-specific turbinate edema  And poor dentition   NECK :  without JVD/Nodes/TM/ nl carotid upstrokes bilaterally   LUNGS: no acc muscle use,  Mod barrel  contour chest wall with bilateral  Distant bs s audible wheeze and  without cough on insp or exp maneuver and mod   Hyperresonant  to  percussion bilaterally     CV:  RRR  no s3 or murmur or increase in P2, and no edema   ABD:  soft and nontender with pos mid insp Hoover's  in the supine position. No bruits or organomegaly appreciated, bowel sounds nl  MS:   Nl gait/  ext warm without deformities, calf tenderness, cyanosis or clubbing No obvious joint restrictions   SKIN: warm and dry without lesions    NEURO:  alert, approp, nl sensorium with  no motor or cerebellar deficits apparent.

## 2018-01-31 ENCOUNTER — Encounter: Payer: Self-pay | Admitting: Internal Medicine

## 2018-01-31 NOTE — Assessment & Plan Note (Signed)
-   started on 27 December 2013 by [facility] clinic - 02/14/2014  Walked RA x 3/4  laps @ 185 ft each stopped due to  desat 85% and corrected on 3lpm to x 2 laps  - 06/15/2014  Walked 2.5lpm  2 laps  @ 185 ft each stopped due to  Sob adequate sats  - 02/20/2015  Walked RA x 3 laps @ 185 ft each stopped due to  Sob/desat p 2 laps, nl pace, eliminated on his 02 at 3lpm  - 12/05/2015   Walked RA  2 laps @ 185 ft each stopped due to  desat to 88% and corrected on 2lpm   rec as of 01/30/2018 =  2lpm  Sleep  And prn with activity

## 2018-01-31 NOTE — Assessment & Plan Note (Signed)
-   02/14/2014   try symbicort 160 2bid  - PFT's  02/14/2014   FEV1 1.82 (53%) and ratio 47 no better p alb (p spiriva and symbicort 160)  and DLCO 32 with 37%  07/2014 >Spiriva stopped  09/15/2014 Med calendar> not using 02/20/2015   - Declined rehab 07/31/15  - Quit smoking 11/2016  - flutter valve 04/01/2016  -med calendar 08/20/16  - added prn pred to action plan 12/05/2016  - changed to maint pred 02/25/2017 with floor of 10 mg daily >>>  - 06/05/2017  After extensive coaching device effectiveness =    90% smi > change tudorza to spiriva 2.5 x 2 > unable to afford - 11/03/2017  After extensive coaching inhaler device  effectiveness =    90% with DPI/ tudorza   - add pepcid 20 mg at hs 01/30/2018 for noct/ am wheeze   Symptoms remain difficult to control and steroid dep so only addition is pepcid  At hs for now and follow the med calendar    Each maintenance medication was reviewed in detail including most importantly the difference between maintenance and as needed and under what circumstances the prns are to be used. This was done in the context of a medication calendar review which provided the patient with a user-friendly unambiguous mechanism for medication administration and reconciliation and provides an action plan for all active problems. It is critical that this be shown to every doctor  for modification during the office visit if necessary so the patient can use it as a working document.

## 2018-02-23 ENCOUNTER — Other Ambulatory Visit: Payer: Self-pay | Admitting: Internal Medicine

## 2018-02-27 ENCOUNTER — Ambulatory Visit: Payer: Medicaid Other | Admitting: Internal Medicine

## 2018-02-28 ENCOUNTER — Other Ambulatory Visit: Payer: Self-pay | Admitting: Internal Medicine

## 2018-03-16 ENCOUNTER — Encounter: Payer: Self-pay | Admitting: Internal Medicine

## 2018-03-16 ENCOUNTER — Ambulatory Visit (INDEPENDENT_AMBULATORY_CARE_PROVIDER_SITE_OTHER): Payer: Medicaid Other | Admitting: Internal Medicine

## 2018-03-16 VITALS — BP 104/70 | HR 89 | Ht 71.0 in | Wt 168.0 lb

## 2018-03-16 DIAGNOSIS — J449 Chronic obstructive pulmonary disease, unspecified: Secondary | ICD-10-CM

## 2018-03-16 DIAGNOSIS — J9611 Chronic respiratory failure with hypoxia: Secondary | ICD-10-CM

## 2018-03-16 DIAGNOSIS — F17210 Nicotine dependence, cigarettes, uncomplicated: Secondary | ICD-10-CM

## 2018-03-16 NOTE — Progress Notes (Signed)
Subjective:   Patient ID: Tony Sullivan, male    DOB: 1961/04/10  MRN: [id number]    Brief patient profile:  89  yobm active smokker with breathing difficulties since 2004 dx as copd on advair 250 /50 but getting worse since 2014 and placed on 02 for the first time in March 2015 and referred 01/07/2014 to pulmonary clinic by Dr Kennon Holter with GOLD II copd criteria established 02/14/14    History of Present Illness  01/07/2014 1st [facility] office visit/ [facility]  Chief Complaint  Patient presents with  . Advice Only    Referred for COPD, SOB.  Pt c/o SOB, low 02, prod cough with white mucous.  Pt forgot med list, unsure of what all he is taking  still can do a [employer] but uses [facility] parking, assoc with congested coughing esp in am worse in winter. rec Stop advair   Plan A =  Automatic =  symbicort 160 Take 2 puffs first thing in am and then another 2 puffs about 12 hours later. spiriva in am only  Plan B = Backup- Proventil use it only if you can't your breath - ok to use up to every 4 hours if needed Plan C = Nebulizer up to every 4 hours if needed only if you try plan B first and it doesn't work    01/30/2018  f/u ov/[facility] re: COPD GOLD II / anxiety  Chief Complaint  Patient presents with  . Follow-up    increased SOB x 1week,productive cough light green in color  Dyspnea:  X 50 ft on 2lpm  Cough: more freq x one week/ no pattern> has oxy but hasn't started yet  Sleep: on 02 2lpm @ 30 degrees and worse wheeze in am when rising SABA use:  saba hfa = neb  Including x 2 in am of ov  rec Pepcid 20 mg one at bedtime  See calendar for specific medication instructions      03/16/2018  f/u ov/[facility] re: GOLD II/ worse sob/ no med calendar  Chief Complaint  Patient presents with  . Follow-up    Breathing has been progressively worse since last visit. He is noticing dizziness when he gets SOB.  He has been using his proair 4-5 x per day and neb with albuterol 2 x daily. He has  noticed some swelling in his left foot over the past wk.    Dyspnea:  No better/ 50 ft gives out (walks fast pace)  Cough: mild hacking Sleep: 30 degrees and 2-3 lpm  SABA use:   Already this am used prednisone 10 mg x 2 / symb x 2/ tudorza / saba x 3 pffs so far   No obvious day to day or daytime variability or assoc excess/ purulent sputum or mucus plugs or hemoptysis or cp or chest tightness, subjective wheeze or overt sinus or hb symptoms. No unusual exposure hx or h/o childhood pna/ asthma or knowledge of premature birth.  Sleeping  As above with head elevated   without nocturnal  or early am exacerbation  of respiratory  c/o's or need for noct saba. Also denies any obvious fluctuation of symptoms with weather or environmental changes or other aggravating or alleviating factors except as outlined above   Current Allergies, Complete Past Medical History, Past Surgical History, Family History, and Social History were reviewed in [employer] record.  ROS  The following are not active complaints unless bolded Hoarseness, sore throat, dysphagia, dental problems,  itching, sneezing,  nasal congestion or discharge of excess mucus or purulent secretions, ear ache,   fever, chills, sweats, unintended wt loss or wt gain, classically pleuritic or exertional cp,  orthopnea pnd or arm/hand swelling  or leg swelling, presyncope, palpitations, abdominal pain, anorexia, nausea, vomiting, diarrhea  or change in bowel habits or change in bladder habits, change in stools or change in urine, dysuria, hematuria,  rash, arthralgias, visual complaints, headache, numbness, weakness or ataxia or problems with walking or coordination,  change in mood or  Memory. Feels off balance but no vertigo         Current Meds  Medication Sig  . Aclidinium Bromide (TUDORZA PRESSAIR) 400 MCG/ACT AEPB Inhale 1 puff into the lungs 2 (two) times daily.  Marland Kitchen albuterol (PROAIR HFA) 108 (90 Base) MCG/ACT inhaler  INHALE TWO PUFFS UP TO EVERY 4 HOURS IF NEEDED FOR SHORT OF BREATH  . albuterol (PROVENTIL) (2.5 MG/3ML) 0.083% nebulizer solution Take 2.5 mg by nebulization every 4 (four) hours as needed for wheezing or shortness of breath.   Marland Kitchen amLODipine (NORVASC) 10 MG tablet Take 10 mg by mouth every morning.   . benztropine (COGENTIN) 0.5 MG tablet Take 0.5 mg by mouth at bedtime.   . budesonide-formoterol (SYMBICORT) 160-4.5 MCG/ACT inhaler INHALE TWO PUFFS INTO THE LUNGS TWO (TWO) TIMES DAILY.  . cetirizine (ZYRTEC) 10 MG tablet Take 10 mg by mouth daily.  Marland Kitchen dextromethorphan-guaiFENesin (MUCINEX DM) 30-600 MG 12hr tablet Take 1 tablet by mouth 2 (two) times daily as needed (with flutter valve).  . famotidine (PEPCID) 20 MG tablet One at bedtime  . fluticasone (FLONASE) 50 MCG/ACT nasal spray Place 2 sprays into both nostrils daily as needed for allergies or rhinitis.  Marland Kitchen losartan (COZAAR) 100 MG tablet Take 100 mg by mouth daily.  Marland Kitchen loxapine (LOXITANE) 10 MG capsule Take 10 mg by mouth at bedtime.   . Multiple Vitamin (MULTIVITAMIN) capsule Take 1 capsule by mouth daily.  Marland Kitchen omeprazole (PRILOSEC) 40 MG capsule Take 40 mg by mouth daily.  . OXYGEN Inhale 2 L into the lungs as directed. 2-3 liters with sleep and exertion as needed for low oxygen  . predniSONE (DELTASONE) 10 MG tablet TAKE TWO DAILY UNTIL BETTER THEN 1 DAILY  . Respiratory Therapy Supplies (FLUTTER) DEVI Use as directed  . risperiDONE (RISPERDAL) 2 MG tablet Take 2 mg by mouth at bedtime.  . traMADol (ULTRAM) 50 MG tablet Take 50 mg by mouth 2 (two) times daily as needed for moderate pain.                Objective:   Physical Exam  Very anxious bm nad   06/15/2014  164  >  09/15/2014 158 >   02/20/2015 >156 03/22/2015 >  07/31/2015   150 > 12/05/2015  161   > 04/01/2016    151 > 07/29/2016  152 >   02/25/2017  153 >  06/05/2017  153 > 11/03/2017   166 > 01/30/2018  167> 03/16/2018  168   Vital signs reviewed - Note on arrival 02 sats  91% on RA       HEENT: nl dentition / oropharynx. Nl external ear canals without cough reflex - moderate bilateral non-specific turbinate edema     NECK :  without JVD/Nodes/TM/ nl carotid upstrokes bilaterally   LUNGS: no acc muscle use,  Mod barrel  contour chest wall with bilateral  Distant bs s audible wheeze and  without cough on insp or exp maneuver  and mod   Hyperresonant  to  percussion bilaterally     CV:  RRR  no s3 or murmur or increase in P2, and no edema   ABD:  soft and nontender with pos mid insp Hoover's  in the supine position. No bruits or organomegaly appreciated, bowel sounds nl  MS:   Nl gait/  ext warm without deformities, calf tenderness, cyanosis or clubbing No obvious joint restrictions   SKIN: warm and dry without lesions    NEURO:  alert, approp, nl sensorium with  no motor or cerebellar deficits apparent.

## 2018-03-16 NOTE — Patient Instructions (Addendum)
Due  to your copd and respiratory failure I recommend you stay home through Wednesay the 22nd to use your 02 and multiple pulmonary medications around the clock as prescribed   See calendar for specific medication instructions and bring it back for each and every office visit for every healthcare provider you see.  Without it,  you may not receive the best quality medical care that we feel you deserve.  You will note that the calendar groups together  your maintenance  medications that are timed at particular times of the day.  Think of this as your checklist for what your doctor has instructed you to do until your next evaluation to see what benefit  there is  to staying on a consistent group of medications intended to keep you well.  The other group at the bottom is entirely up to you to use as you see fit  for specific symptoms that may arise between visits that require you to treat them on an as needed basis.  Think of this as your action plan or "what if" list.   Separating the top medications from the bottom group is fundamental to providing you adequate care going forward.     Please schedule a follow up office visit in 6 weeks, call sooner if needed with all medications /inhalers/ solutions in hand so we can verify exactly what you are taking. This includes all medications from all doctors and over the counters  - See Tammy NP on return so she can verify your med calendar is correct.

## 2018-03-18 ENCOUNTER — Encounter: Payer: Self-pay | Admitting: Internal Medicine

## 2018-03-18 NOTE — Assessment & Plan Note (Signed)
-   started on 27 December 2013 by [facility] clinic - 02/14/2014  Walked RA x 3/4  laps @ 185 ft each stopped due to  desat 85% and corrected on 3lpm to x 2 laps  - 06/15/2014  Walked 2.5lpm  2 laps  @ 185 ft each stopped due to  Sob adequate sats  - 02/20/2015  Walked RA x 3 laps @ 185 ft each stopped due to  Sob/desat p 2 laps, nl pace, eliminated on his 02 at 3lpm  - 12/05/2015   Walked RA  2 laps @ 185 ft each stopped due to  desat to 88% and corrected on 2lpm  - 03/16/2018   Walked 3lmpm  x one lap @ 185 stopped due to /desat  To 89% at relatively fast pace   rec as of 03/16/2018 =  2lpm  Sleep  And titrate up with activity to keep > 90% and work on pacing

## 2018-03-18 NOTE — Assessment & Plan Note (Signed)
4-5 min discussion re active cigarette smoking in addition to office E&M  Ask about tobacco use:  ongoing Advise quitting     I emphasized that although we never turn away smokers from the pulmonary clinic, we do ask that they understand that the recommendations that we make  won't work nearly as well in the presence of continued cigarette exposure.  In fact, we may very well  reach a point where we can't promise to help the patient if he/she can't quit smoking. (We can and will promise to try to help, we just can't promise what we recommend will really work)  Assess willingness not ready Assist in quit attempt per pcp when ready Arrange follow up. Follow up per Primary Care planned

## 2018-03-18 NOTE — Assessment & Plan Note (Addendum)
-   02/14/2014   try symbicort 160 2bid  - PFT's  02/14/2014   FEV1 1.82 (53%) and ratio 47 no better p alb (p spiriva and symbicort 160)  and DLCO 32 with 37%  07/2014 >Spiriva stopped  09/15/2014 Med calendar> not using 02/20/2015   - Declined rehab 07/31/15  - Quit smoking 11/2016  - flutter valve 04/01/2016  -med calendar 08/20/16  - added prn pred to action plan 12/05/2016  - changed to maint pred 02/25/2017 with floor of 10 mg daily >>>  - 06/05/2017  After extensive coaching device effectiveness =    90% smi > change tudorza to spiriva 2.5 x 2 > unable to afford - 11/03/2017  After extensive coaching inhaler device  effectiveness =    90% with DPI/ tudorza  - add pepcid 20 mg at hs 01/30/2018 for noct/ am wheeze - Spirometry 03/16/2018  FEV1 1.16 (34%)  Ratio 57 p am symb/ tudorza    03/16/2018  After extensive coaching inhaler device  effectiveness =    90% with hfa   No longer convinced that he is actually following the instructions that he's been given and continues to smoke [place]. He also refuses to consider rehabilitation based on cost although he still  Spending money smoking cigarettes which is self-destructive.  ? Anxiety > usually at the bottom of this list of usual suspects but should be much higher on this pt's based on H and P and note already on psychotropics and may interfere with adherence and also interpretation of response or lack thereof to symptom management which can be quite subjective.   I had an extended discussion with the patient reviewing all relevant studies completed to date and  lasting 15 to 20 minutes of a 25 minute visit    Each maintenance medication was reviewed in detail including most importantly the difference between maintenance and prns and under what circumstances the prns are to be triggered using an action plan format that is not reflected in the computer generated alphabetically organized AVS but trather by a customized med calendar that reflects  the AVS meds with confirmed 100% correlation.   See device teaching which extended face to face time for this visit   In addition, Please see AVS for unique instructions that I personally wrote and verbalized to the the pt in detail and then reviewed with pt  by my nurse highlighting any  changes in therapy recommended at today's visit to their plan of care.    Return in 6 weeks with all meds in hand using a trust but verify approach to confirm accurate Medication  Reconciliation The principal here is that until we are certain that the  patients are doing what we've asked, it makes no sense to ask them to do more.

## 2018-03-25 ENCOUNTER — Other Ambulatory Visit: Payer: Self-pay | Admitting: Internal Medicine

## 2018-04-13 ENCOUNTER — Telehealth: Payer: Self-pay

## 2018-04-13 NOTE — Telephone Encounter (Signed)
PA for Tony Sullivan initiated through [employer]. PA approved until 04/14/2019   PA number [id number]  Interaction number [id number]  Called pharmacy to let them know. Nothing further is needed.

## 2018-04-17 ENCOUNTER — Telehealth: Payer: Self-pay | Admitting: Internal Medicine

## 2018-04-17 ENCOUNTER — Other Ambulatory Visit: Payer: Self-pay | Admitting: Adult Health

## 2018-04-17 NOTE — Telephone Encounter (Signed)
Called and spoke to pt.  Pt is requesting refill on Prednisone.  Per our records Rx for prednisone was sent to [facility] today. Pt is aware and voiced his understanding. Nothing further is needed.

## 2018-04-28 ENCOUNTER — Encounter: Payer: Self-pay | Admitting: Primary Care

## 2018-04-28 ENCOUNTER — Ambulatory Visit (INDEPENDENT_AMBULATORY_CARE_PROVIDER_SITE_OTHER): Payer: Medicaid Other | Admitting: Primary Care

## 2018-04-28 VITALS — BP 122/64 | HR 105 | Ht 71.5 in | Wt 167.0 lb

## 2018-04-28 DIAGNOSIS — J441 Chronic obstructive pulmonary disease with (acute) exacerbation: Secondary | ICD-10-CM | POA: Diagnosis not present

## 2018-04-28 DIAGNOSIS — J449 Chronic obstructive pulmonary disease, unspecified: Secondary | ICD-10-CM

## 2018-04-28 MED ORDER — ALBUTEROL SULFATE (2.5 MG/3ML) 0.083% IN NEBU: 2.5000 mg | INHALATION_SOLUTION | RESPIRATORY_TRACT | 3 refills | Status: DC | PRN

## 2018-04-28 MED ORDER — FAMOTIDINE 20 MG PO TABS: ORAL_TABLET | ORAL | 11 refills | Status: DC

## 2018-04-28 MED ORDER — PREDNISONE 10 MG PO TABS: ORAL_TABLET | ORAL | 3 refills | Status: DC

## 2018-04-28 MED ORDER — BUDESONIDE-FORMOTEROL FUMARATE 160-4.5 MCG/ACT IN AERO: INHALATION_SPRAY | RESPIRATORY_TRACT | 5 refills | Status: DC

## 2018-04-28 NOTE — Progress Notes (Signed)
@Patient  ID: Tony Sullivan, male    DOB: [birth year], 57 y.o.   MRN: [id number]  Chief Complaint  Patient presents with  . Follow-up    COPD    Referring provider: [name], FNP  HPI:  This is a pleasant 57 year old male, active smoker (quit smoking in 2017 but has restarted). Hx COPD, on advair 250/50. Started home oxygen in 2015.  Referred to pulmonary in 2015 with GOLD II COPD. Spirometry in 02/2018 FEV1 1.16 (34%) ratio 57 p am symb/tudorza   04/28/2018 Complains of cough and chest tightness.  Seen in April by Dr. [name] for increased SOB with green sputum production. Asked to bring medications in with him but he forgot.   04/28/2018  Experiencing sob and congested cough. No fever. Patient brought sputum sample in water bottle today. Mainly white with light green coloring. Currently taking prednisone 10mg  two-three times a day. States that it has helped with his breathing but not cough. More DOE with walking. Using home oxygen. Uses rescue inhaler on average 4-6 times a day. Taking Tudorza and symbicort as prescribed. Takes doxycyline po twice daily as needed, requiring monthly, prescribed by PCP 6 months ago. He filled RX this morning.   He is smoking less, reports 6 cigarettes a day (previously 2 ppd)  Allergies  Allergen Reactions  . Penicillins Anaphylaxis    Has patient had a PCN reaction causing immediate rash, facial/tongue/throat swelling, SOB or lightheadedness with hypotension: Yes Has patient had a PCN reaction causing severe rash involving mucus membranes or skin necrosis: No Has patient had a PCN reaction that required hospitalization No Has patient had a PCN reaction occurring within the last 10 years: No If all of the above answers are "NO", then may proceed with Cephalosporin use.   . Sulfa Antibiotics Swelling    Swelling of hands and throat   . Famotidine Other (See Comments)    Bloating of stomach, loss of appetite  . Levocetirizine Other (See  Comments)    Muscle cramps    Immunization History  Administered Date(s) Administered  . Influenza Split 08/09/2013, 08/03/2014  . Influenza Whole 07/28/2017  . Influenza,inj,Quad PF,6+ Mos 07/31/2015, 07/29/2016  . Pneumococcal Conjugate-13 07/31/2015  . Pneumococcal Polysaccharide-23 08/03/2014    Past Medical History:  Diagnosis Date  . Asthma   . COPD (chronic obstructive pulmonary disease) ([place]) 2004   diagnosed in 2004, no PFT's to date.  Started on home O2 12/2013, after found to be desatting at PCP's office, and referred to pulmonology.  . High blood pressure   . Seasonal allergies     Tobacco History: Social History   Tobacco Use  Smoking Status Current Some Day Smoker  . Packs/day: 0.50  . Years: 36.00  . Pack years: 18.00  . Types: Cigarettes  Smokeless Tobacco Never Used   Ready to quit: Not Answered Counseling given: Not Answered   Outpatient Medications Prior to Visit  Medication Sig Dispense Refill  . Aclidinium Bromide (TUDORZA PRESSAIR) 400 MCG/ACT AEPB Inhale 1 puff into the lungs 2 (two) times daily. 30 each 11  . albuterol (PROAIR HFA) 108 (90 Base) MCG/ACT inhaler INHALE TWO PUFFS UP TO EVERY 4 HOURS IF NEEDED FOR SHORT OF BREATH 8.5 Inhaler 1  . amLODipine (NORVASC) 10 MG tablet Take 10 mg by mouth every morning.     . benztropine (COGENTIN) 0.5 MG tablet Take 0.5 mg by mouth at bedtime.     . cetirizine (ZYRTEC) 10 MG tablet Take  10 mg by mouth daily.    Marland Kitchen dextromethorphan-guaiFENesin (MUCINEX DM) 30-600 MG 12hr tablet Take 1 tablet by mouth 2 (two) times daily as needed (with flutter valve).    Marland Kitchen doxycycline (VIBRA-TABS) 100 MG tablet Take 100 mg by mouth 2 (two) times daily. x14 days at the beginning of every month as prescribed by PCP    . fluticasone (FLONASE) 50 MCG/ACT nasal spray Place 2 sprays into both nostrils daily as needed for allergies or rhinitis.    Marland Kitchen losartan (COZAAR) 100 MG tablet Take 100 mg by mouth daily.    Marland Kitchen loxapine  (LOXITANE) 10 MG capsule Take 10 mg by mouth at bedtime.     . Multiple Vitamin (MULTIVITAMIN) capsule Take 1 capsule by mouth daily.    Marland Kitchen omeprazole (PRILOSEC) 40 MG capsule Take 40 mg by mouth daily.    . OXYGEN Inhale 2 L into the lungs as directed. 2-3 liters with sleep and exertion as needed for low oxygen    . Respiratory Therapy Supplies (FLUTTER) DEVI Use as directed 1 each 0  . risperiDONE (RISPERDAL) 2 MG tablet Take 2 mg by mouth at bedtime.    . traMADol (ULTRAM) 50 MG tablet Take 50 mg by mouth 2 (two) times daily as needed for moderate pain.     Marland Kitchen albuterol (PROVENTIL) (2.5 MG/3ML) 0.083% nebulizer solution Take 2.5 mg by nebulization every 4 (four) hours as needed for wheezing or shortness of breath.     . budesonide-formoterol (SYMBICORT) 160-4.5 MCG/ACT inhaler INHALE TWO PUFFS INTO THE LUNGS TWO (TWO) TIMES DAILY. 1 Inhaler 5  . famotidine (PEPCID) 20 MG tablet One at bedtime 30 tablet 11  . famotidine (PEPCID) 20 MG tablet TAKE 1 TABLET BY MOUTH AFTER BREAKFAST AND SUPPER 60 tablet 11  . predniSONE (DELTASONE) 10 MG tablet TAKE TWO TABLETS DAILY UNTIL BETTER THEN 1 DAILY 60 tablet 3   No facility-administered medications prior to visit.       Review of Systems  Review of Systems  Constitutional: Positive for chills.  Respiratory: Positive for cough, chest tightness, shortness of breath and wheezing.   Cardiovascular: Negative for chest pain.     Physical Exam  BP 122/64 (BP Location: Left Arm, Cuff Size: Normal)   Pulse (!) 105   Ht 5' 11.5" (1.816 m)   Wt 167 lb (75.8 kg)   SpO2 93%   BMI 22.97 kg/m  Physical Exam  Constitutional: He appears cachectic. He is cooperative.  HENT:  Mouth/Throat: Posterior oropharyngeal erythema present. No posterior oropharyngeal edema.  Cardiovascular: Regular rhythm, normal heart sounds and normal pulses. Tachycardia present.  Pulmonary/Chest: No accessory muscle usage or stridor. No tachypnea. No respiratory distress. He  has decreased breath sounds in the right lower field. He has wheezes in the right lower field and the left lower field. He has rhonchi in the right lower field.  Skin: He is not diaphoretic.     Lab Results: Sputum culture today Imaging: CXR today  No results found.   Assessment & Plan:   COPD with acute exacerbation ([place]) Given RX from PCP for doxycycline prn, I believe he has been taking this medication monthly for the last 6 months. Patient filled this morning, ok to take. Discouraged frequent use of abx to prevent drug resistance. Would consider azithro if patient needed daily abx. Plan to obtain CXR today, needs sputum culture and start prednisone taper. Neb treatments to help with sob/cough/congestion. STRONGLY enc smoking cessation, patient has cut down to 6  cigarettes daily from 2 packs.      [name], NP 04/28/2018

## 2018-04-28 NOTE — Patient Instructions (Addendum)
Check CXR and sputum culture Prednisone taper (4,4,4,3,3,3,2,2,2,); then continue as prescribed  Use neb treatments as needed for wheeze/cough  (do not duplicate with albuterol inhaler) Takes doxycyline per PCP, monitor how frequently you are using this. If taken regularly you could develop antibiotic resistance.  We advise that you stop smoking (do not smoke with oxygen tank on) PLEASE bring in medications to next apt  Follow-up in 6 weeks

## 2018-04-28 NOTE — Assessment & Plan Note (Addendum)
Given RX from PCP for doxycycline prn, I believe he has been taking this medication every month for the last 6 months. Patient filled this morning, ok to take. Discouraged frequent use of abx to prevent drug resistance. Would consider azithromycin 250mg  po daily if continues to have recurrent COPD exacerbation. Strongly encourage smoking cessation. Plan to obtain CXR today, needs sputum culture and start prednisone taper. Neb treatments to help with sob/cough/congestion. Follow up in 6 weeks, bring in medications with you to reconcile.

## 2018-04-29 ENCOUNTER — Telehealth: Payer: Self-pay | Admitting: Primary Care

## 2018-04-29 NOTE — Telephone Encounter (Signed)
Called and spoke with patient, he states that he was not told to go downstairs at yesterdays visit for the cxr. Patient will be coming back at the beginning of next week for the cxr and to bring his sputum cx in at that time. Nothing further needed.

## 2018-05-04 ENCOUNTER — Other Ambulatory Visit: Payer: Self-pay | Admitting: Internal Medicine

## 2018-05-04 ENCOUNTER — Other Ambulatory Visit: Payer: Medicaid Other

## 2018-05-04 ENCOUNTER — Telehealth: Payer: Self-pay | Admitting: Primary Care

## 2018-05-04 ENCOUNTER — Ambulatory Visit (INDEPENDENT_AMBULATORY_CARE_PROVIDER_SITE_OTHER)
Admission: RE | Admit: 2018-05-04 | Discharge: 2018-05-04 | Disposition: A | Discharge status: Home / Self Care | Payer: Medicaid Other | Source: Ambulatory Visit | Attending: Primary Care | Admitting: Primary Care

## 2018-05-04 DIAGNOSIS — J449 Chronic obstructive pulmonary disease, unspecified: Secondary | ICD-10-CM

## 2018-05-04 IMAGING — DX DG CHEST 2V
1 series · 2 of 2 positions shown · non-contrast
Comparison: [DATE]

CLINICAL DATA: Chest tightness with sputum x1 day.

EXAM:
CHEST - 2 VIEW

[Series 1: chest pa · 0.14mm/px · 2 of 2 slices shown]
[im 1/2]
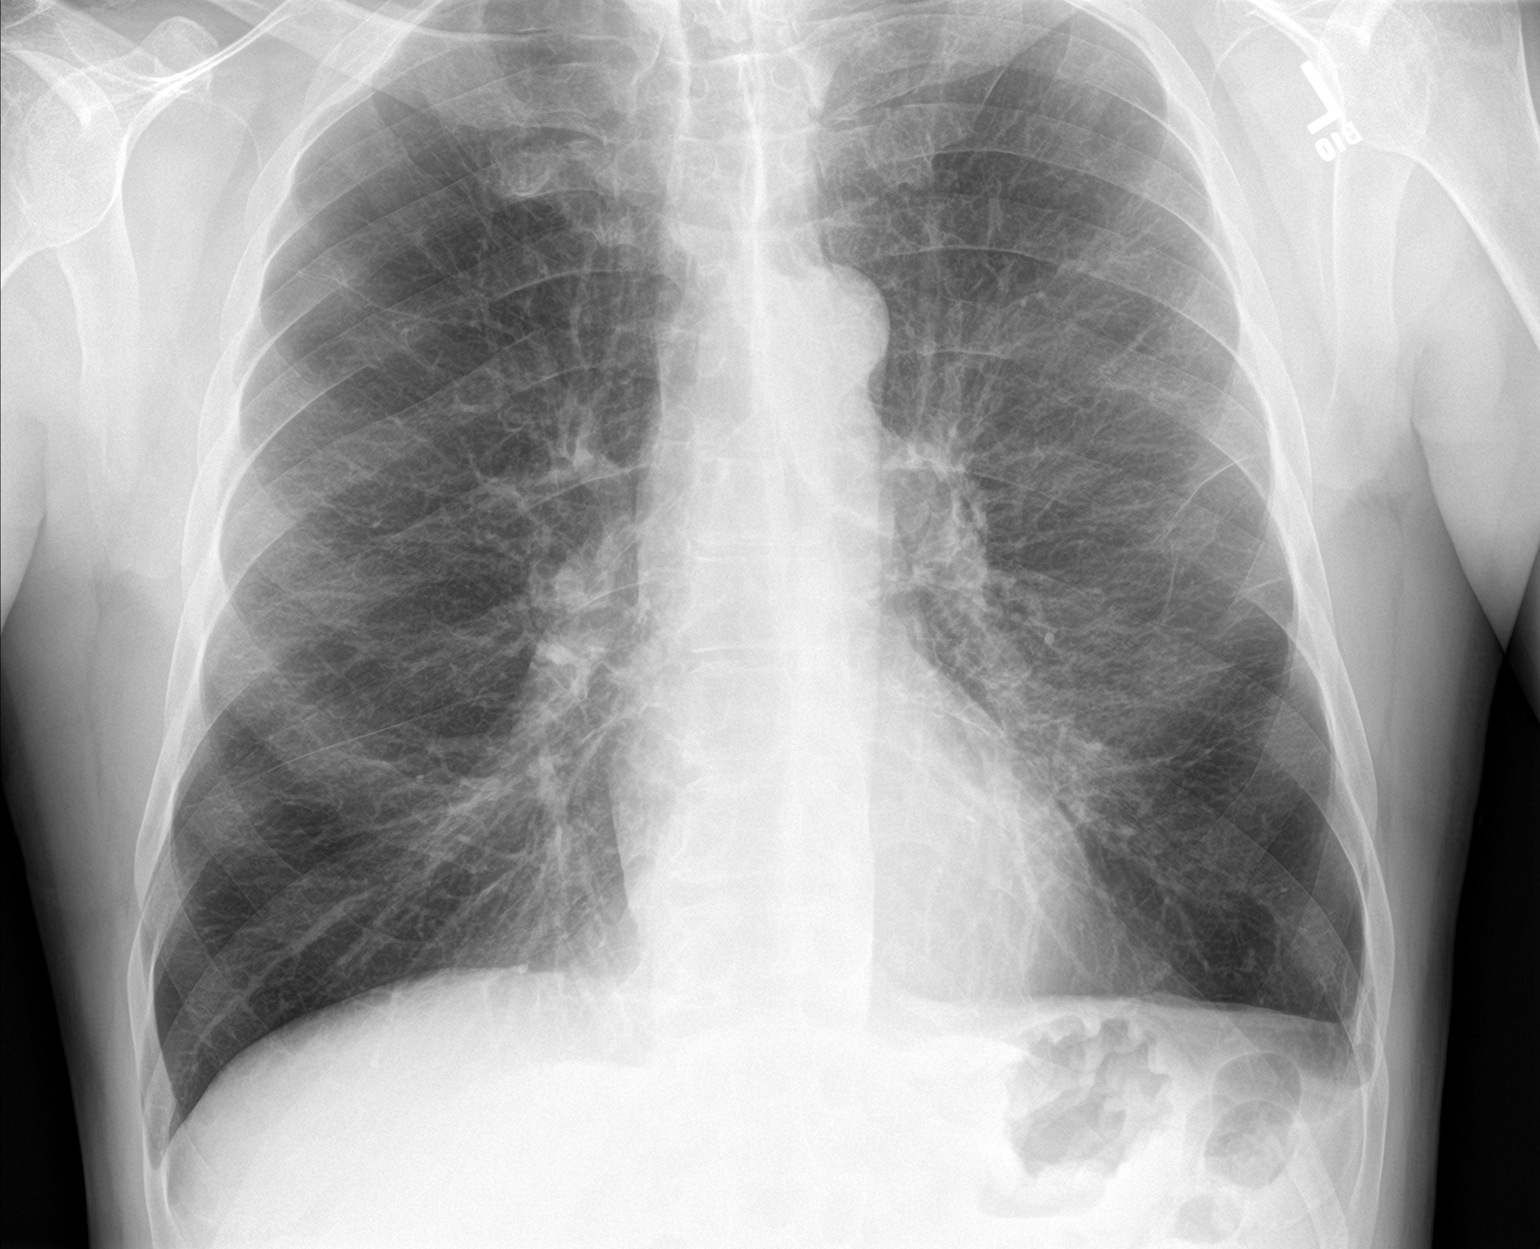
[im 2/2]
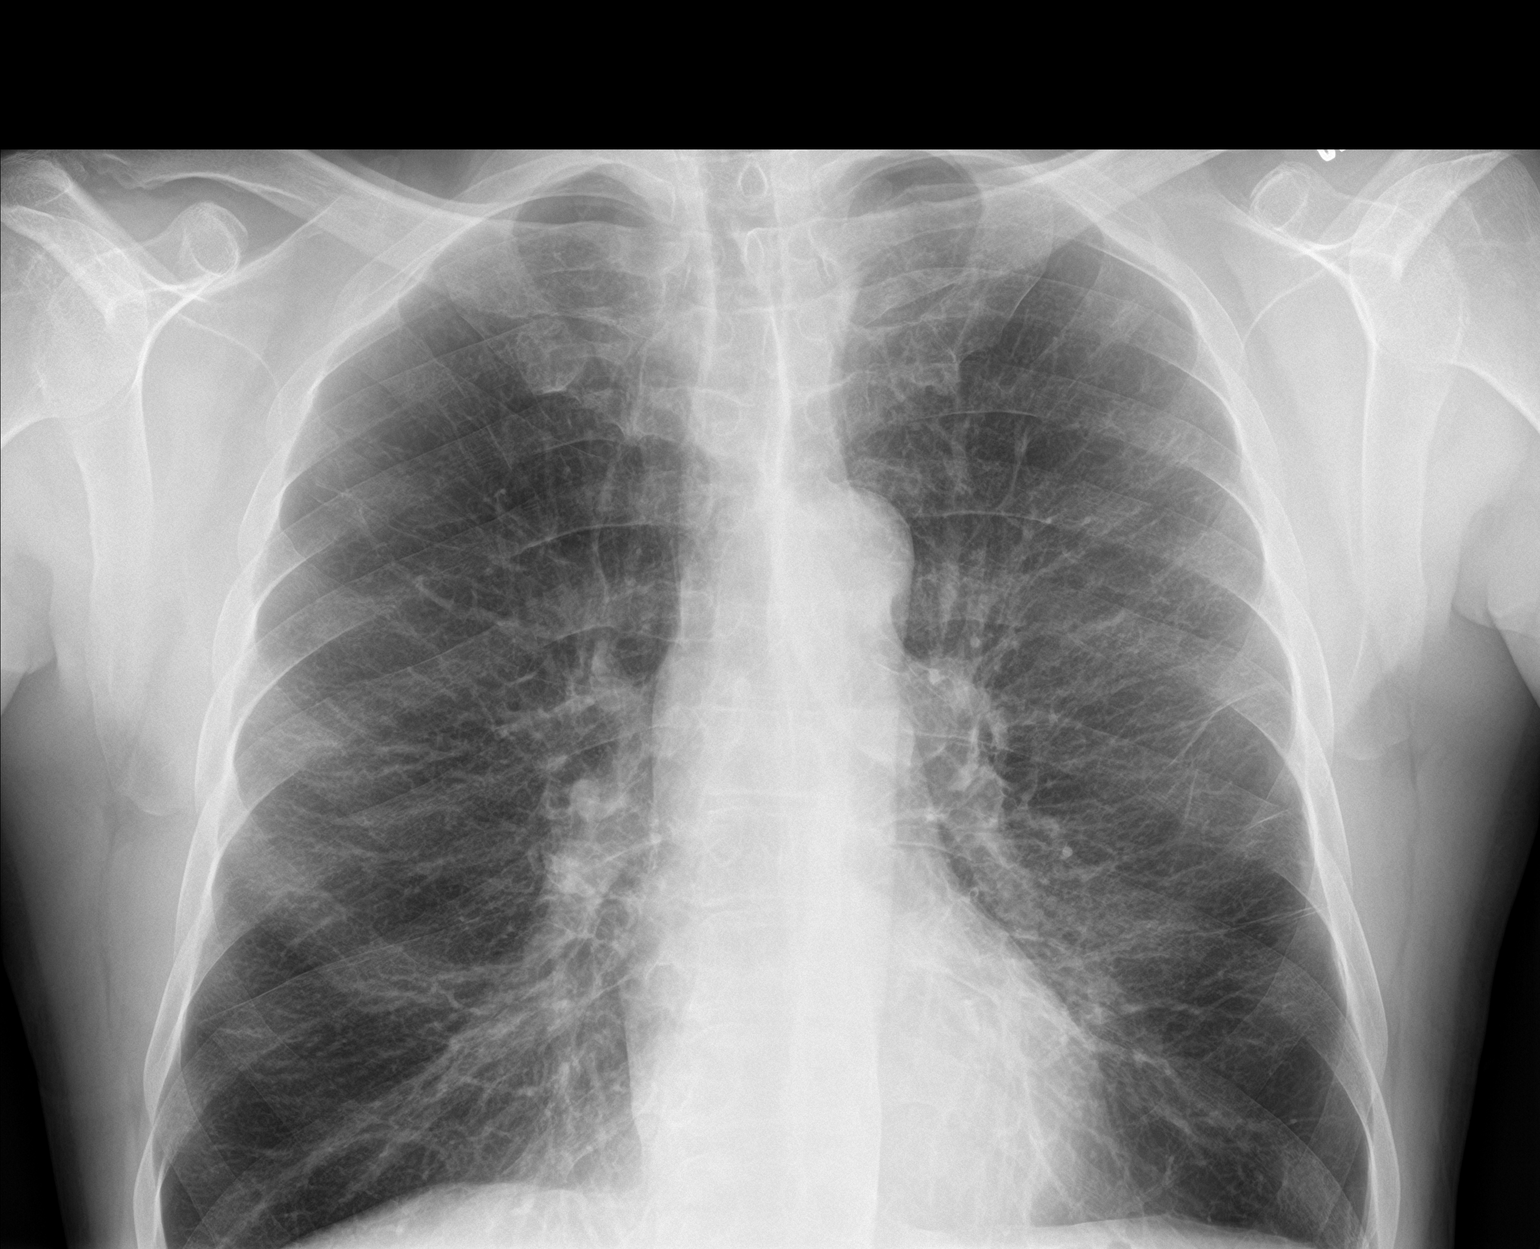

[2 of 2 positions shown; findings below may reference images not displayed]

FINDINGS: Emphysematous hyperinflation of the lungs compatible with emphysema.
No pulmonary consolidation, CHF, effusion or pneumothorax.
Costophrenic angles are excluded on this current study bilaterally.
Subpleural scarring is noted in the left mid lung. Heart size is
within normal limits. There is minimal aortic atherosclerosis at the
arch without aneurysmal dilatation. No aggressive nor acute osseous
appearing abnormality.
IMPRESSION: 1. Redemonstration of emphysematous hyperinflation of the lungs
compatible COPD.
2. Slight aortic atherosclerosis without aneurysmal dilatation.

## 2018-05-04 NOTE — Progress Notes (Signed)
Chart and office note reviewed in detail  > agree with a/p as outlined    

## 2018-05-04 NOTE — Telephone Encounter (Signed)
Spoke with patient and reviewed CXR. No acute changes, continues to show hyperinflation emphysema consistent with COPD. Cough sputum has lightened in color and wheezing has improved. Heat/humidity exacerbates his symptoms. Awaiting sputum culture which was brought in today. Has an apt with primary care tomorrow.

## 2018-05-08 LAB — RESPIRATORY CULTURE OR RESPIRATORY AND SPUTUM CULTURE
MICRO NUMBER:: 90805016
SPECIMEN QUALITY:: ADEQUATE

## 2018-05-11 ENCOUNTER — Telehealth: Payer: Self-pay | Admitting: Primary Care

## 2018-05-11 MED ORDER — LEVOFLOXACIN 500 MG PO TABS: 500.0000 mg | ORAL_TABLET | Freq: Every day | ORAL | 0 refills | Status: DC

## 2018-05-11 NOTE — Telephone Encounter (Signed)
Sputum culture +streptococcus pneumoniae. Patient continues to have productive cough. States that it has improved somewhat but not 100%. Will send levaquin to pharm, advised to stop prn doxy.

## 2018-05-12 ENCOUNTER — Emergency Department (HOSPITAL_COMMUNITY)
Admission: EM | Admit: 2018-05-12 | Discharge: 2018-05-12 | Disposition: A | Discharge status: Home / Self Care | Payer: Medicaid Other | Attending: Emergency Medicine | Admitting: Emergency Medicine

## 2018-05-12 ENCOUNTER — Emergency Department (HOSPITAL_COMMUNITY): Payer: Medicaid Other

## 2018-05-12 ENCOUNTER — Other Ambulatory Visit: Payer: Self-pay

## 2018-05-12 DIAGNOSIS — I10 Essential (primary) hypertension: Secondary | ICD-10-CM | POA: Insufficient documentation

## 2018-05-12 DIAGNOSIS — J441 Chronic obstructive pulmonary disease with (acute) exacerbation: Secondary | ICD-10-CM | POA: Insufficient documentation

## 2018-05-12 DIAGNOSIS — F17210 Nicotine dependence, cigarettes, uncomplicated: Secondary | ICD-10-CM | POA: Insufficient documentation

## 2018-05-12 DIAGNOSIS — Z79899 Other long term (current) drug therapy: Secondary | ICD-10-CM | POA: Diagnosis not present

## 2018-05-12 DIAGNOSIS — Z85038 Personal history of other malignant neoplasm of large intestine: Secondary | ICD-10-CM | POA: Insufficient documentation

## 2018-05-12 DIAGNOSIS — R071 Chest pain on breathing: Secondary | ICD-10-CM | POA: Diagnosis present

## 2018-05-12 LAB — BASIC METABOLIC PANEL
Anion gap: 9 (ref 5–15)
BUN: 14 mg/dL (ref 6–20)
CO2: 28 mmol/L (ref 22–32)
Calcium: 9 mg/dL (ref 8.9–10.3)
Chloride: 105 mmol/L (ref 98–111)
Creatinine, Ser: 1.03 mg/dL (ref 0.61–1.24)
GFR calc Af Amer: >60 mL/min (ref >60)
GFR calc non Af Amer: >60 mL/min (ref >60)
Glucose, Bld: 98 mg/dL (ref 70–99)
Potassium: 3.6 mmol/L (ref 3.5–5.1)
Sodium: 142 mmol/L (ref 135–145)

## 2018-05-12 LAB — CBC
HCT: 51.9 % (ref 39.0–52.0)
Hemoglobin: 17.6 g/dL — ABNORMAL HIGH (ref 13.0–17.0)
MCH: 33.5 pg (ref 26.0–34.0)
MCHC: 33.9 g/dL (ref 30.0–36.0)
MCV: 98.9 fL (ref 78.0–100.0)
Platelets: 278 10*3/uL (ref 150–400)
RBC: 5.25 MIL/uL (ref 4.22–5.81)
RDW: 15.3 % (ref 11.5–15.5)
WBC: 12.2 10*3/uL — ABNORMAL HIGH (ref 4.0–10.5)

## 2018-05-12 LAB — TROPONIN I: Troponin I: <0.03 ng/mL (ref <0.03)

## 2018-05-12 IMAGING — CR DG CHEST 2V
2 series · 2 of 2 positions shown · non-contrast
Comparison: [DATE], [DATE] and earlier.

CLINICAL DATA: Two week history of progressively worsening
shortness of breath, chest discomfort and abdominal pain. Current
history of COPD.

EXAM:
CHEST - 2 VIEW

[w chest pa]
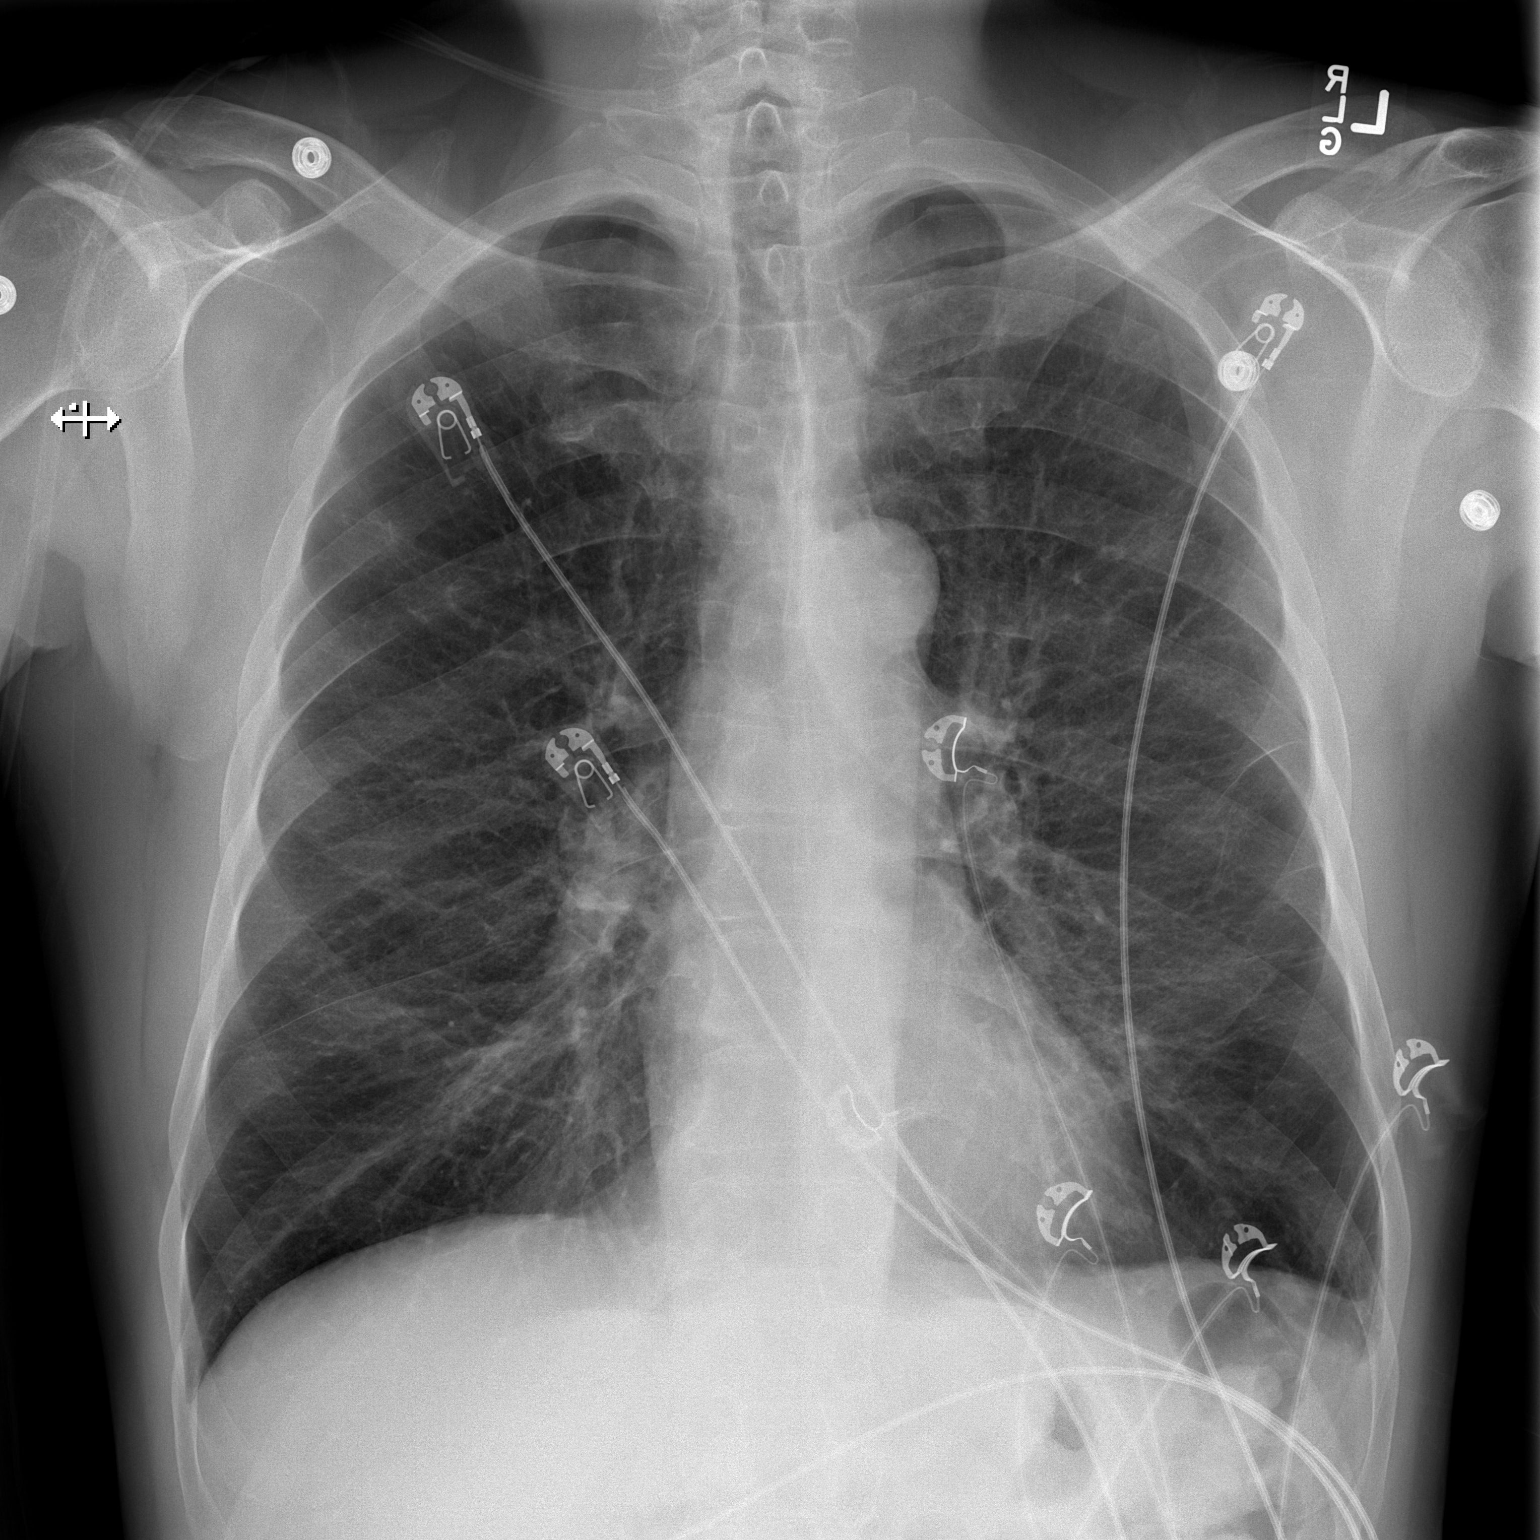

[w chest lat]
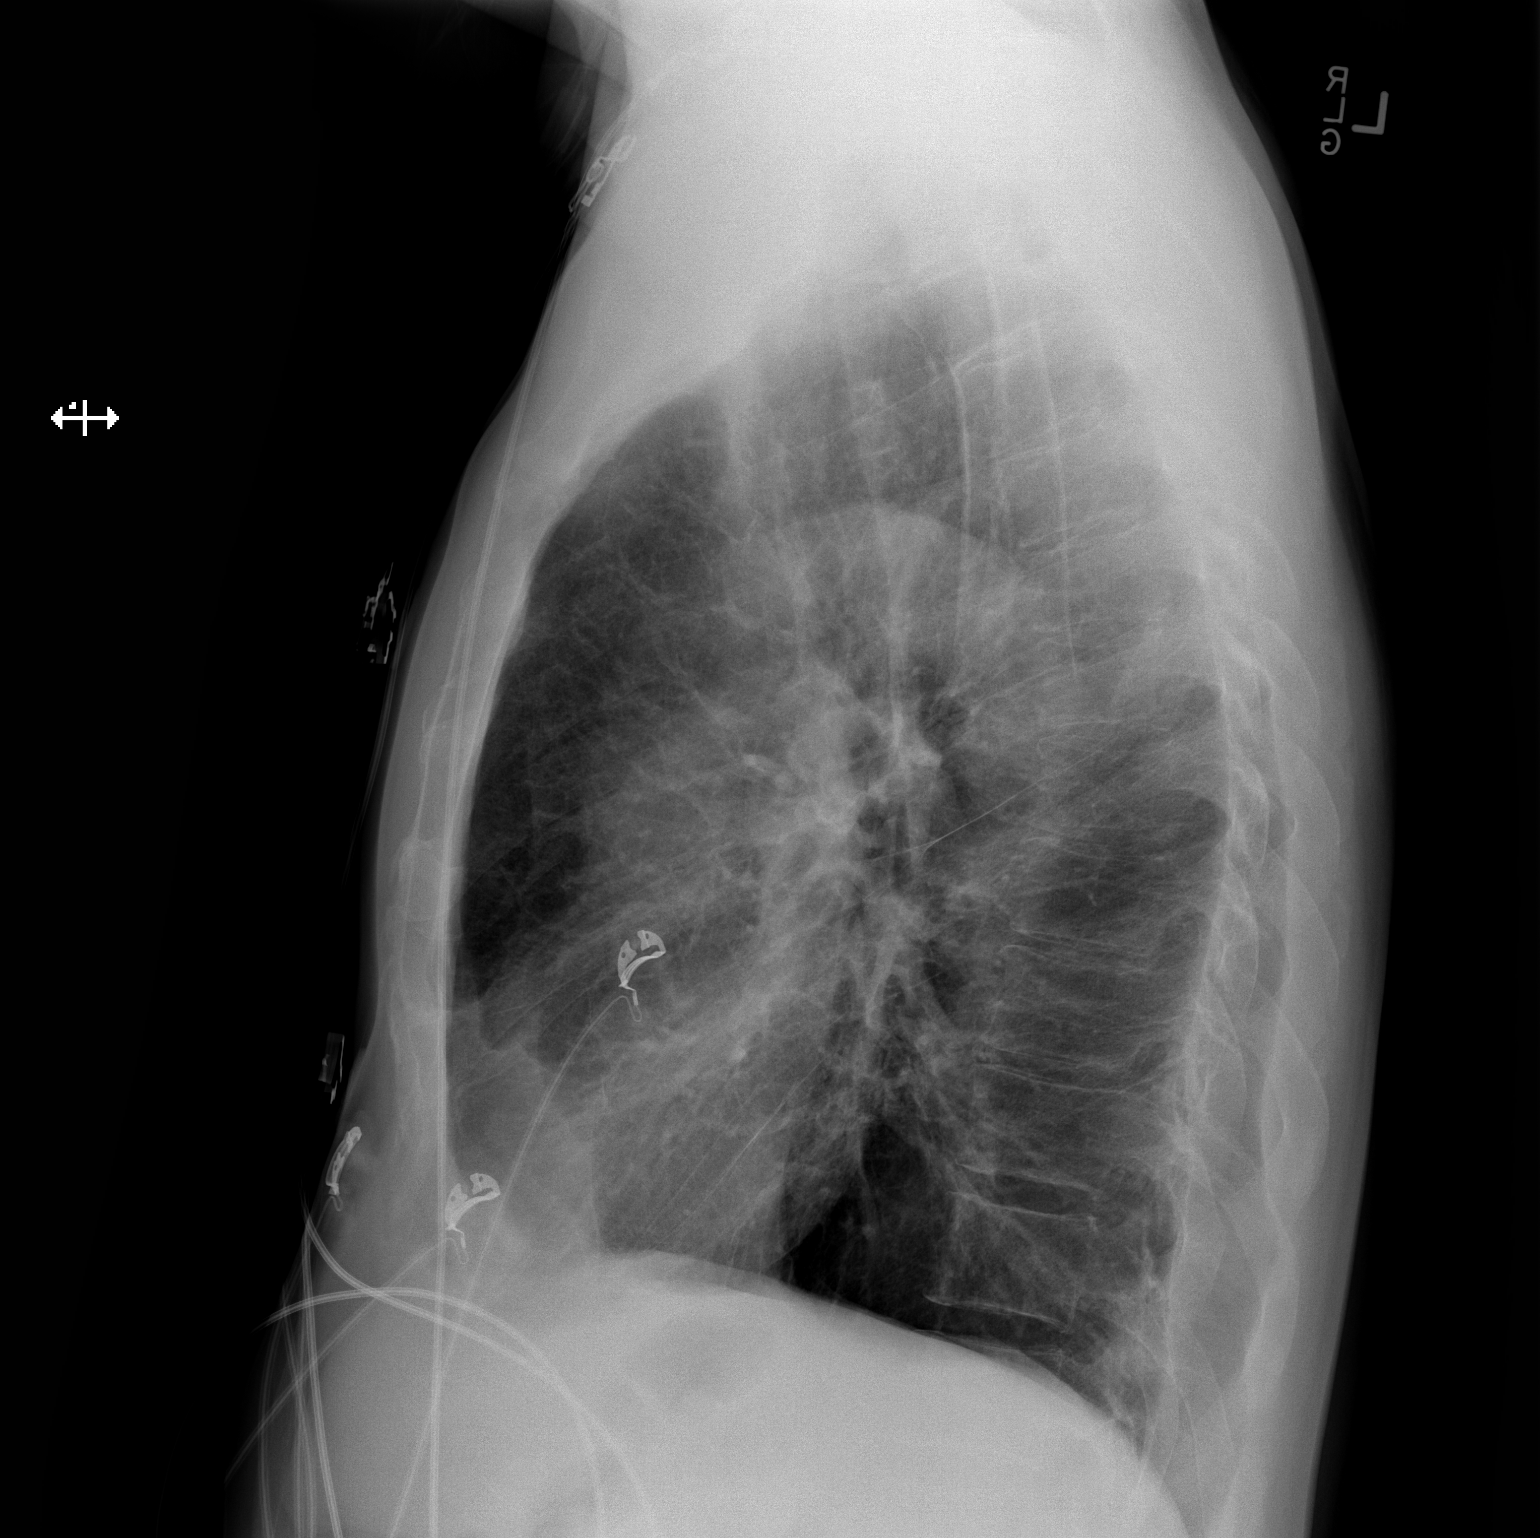

[2 of 2 positions shown; findings below may reference images not displayed]

FINDINGS: Cardiomediastinal silhouette unremarkable, unchanged. Emphysematous
changes throughout both lungs with mild hyperinflation, unchanged.
Mildly prominent bronchovascular markings diffusely and mild central
peribronchial thickening, unchanged. Lungs otherwise clear. No
localized airspace consolidation. No pleural effusions. No
pneumothorax. Normal pulmonary vascularity. Visualized bony thorax
intact.
IMPRESSION: 1.  No acute cardiopulmonary disease.
2.  Emphysema ([2O]-[2O]).

## 2018-05-12 IMAGING — CT CT ANGIO CHEST
2 of 7 series · 18 of 46 positions shown · IV contrast (ISOVUE 370)
Comparison: Chest CT [DATE]; chest radiograph [DATE]

CLINICAL DATA: Shortness of breath with decreased oxygen saturation

EXAM:
CT ANGIOGRAPHY CHEST WITH CONTRAST
TECHNIQUE: Multidetector CT imaging of the chest was performed using the
standard protocol during bolus administration of intravenous
contrast. Multiplanar CT image reconstructions and MIPs were
obtained to evaluate the vascular anatomy.
CONTRAST:  75mL [BV] IOPAMIDOL ([BV]) INJECTION 76%

[Series 5: thins · axial · 0.82mm/px · z∈[+452,+790]mm · 15 of 383 slices shown]
[im 23/383  lung]
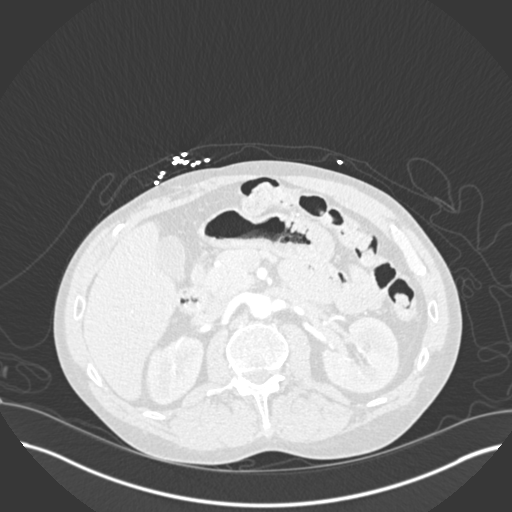
[im 45/383  soft-tissue]
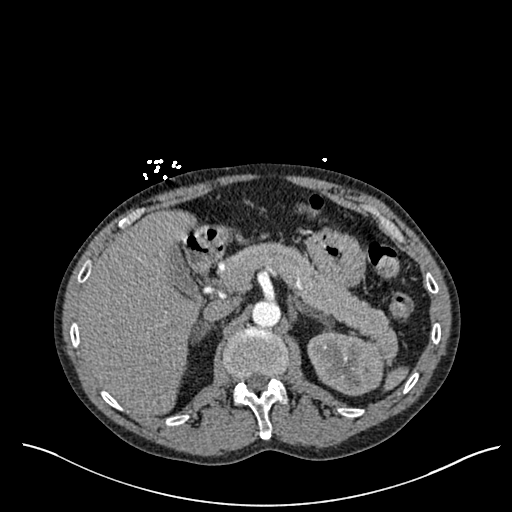
[im 68/383  lung]
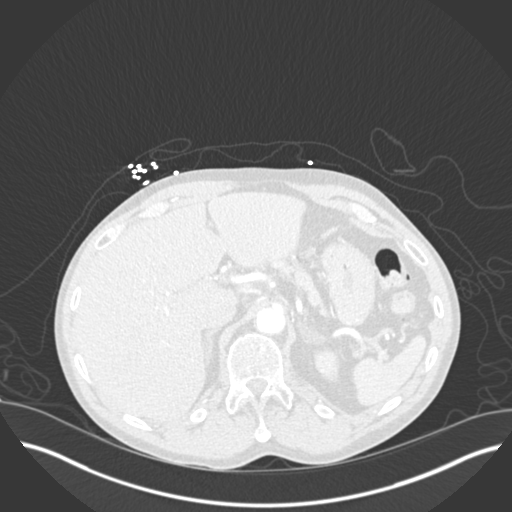
[im 90/383  soft-tissue]
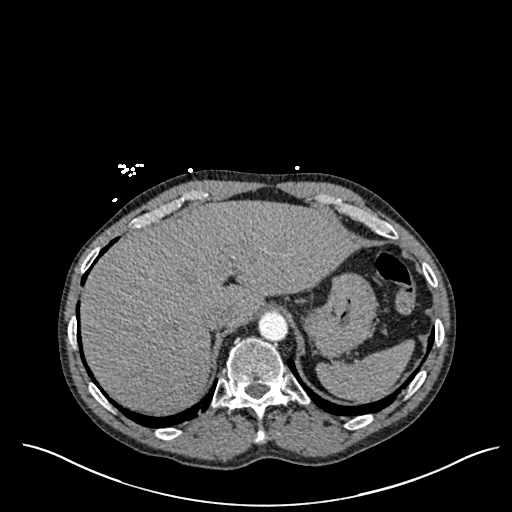
[im 113/383  lung]
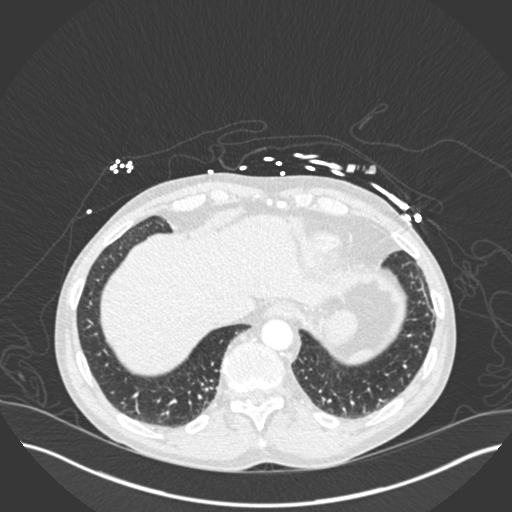
[im 135/383  soft-tissue]
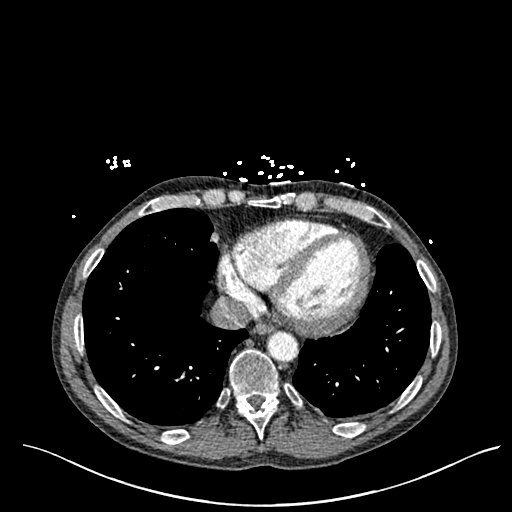
[im 158/383  lung]
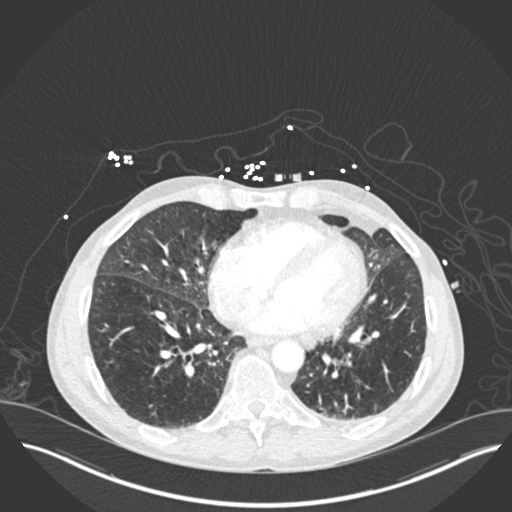
[im 203/383  soft-tissue]
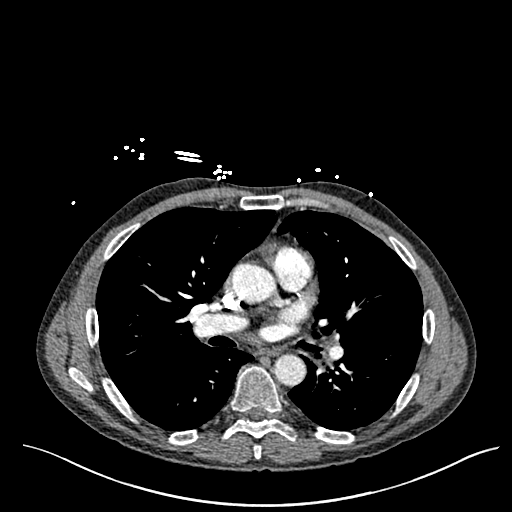
[im 225/383  lung]
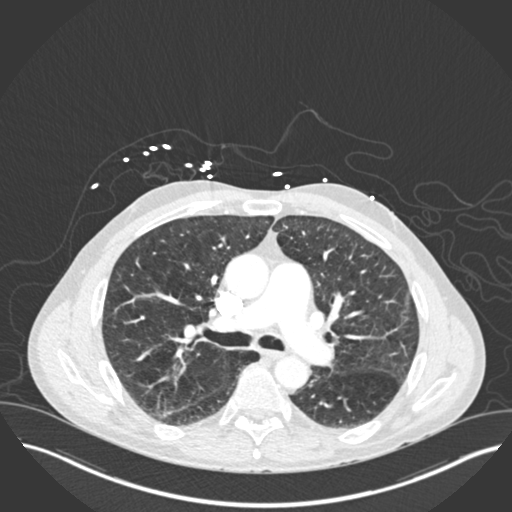
[im 248/383  soft-tissue]
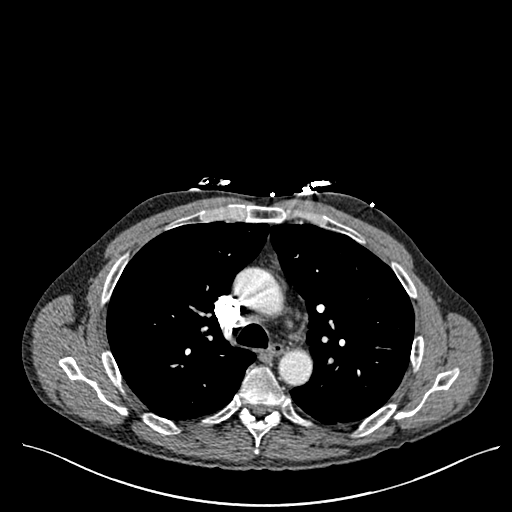
[im 270/383  lung]
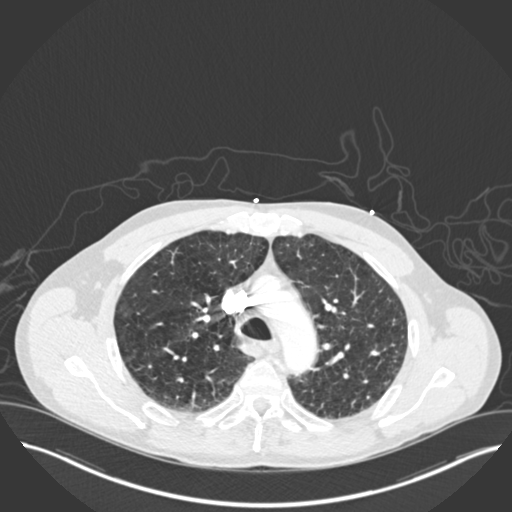
[im 293/383  soft-tissue]
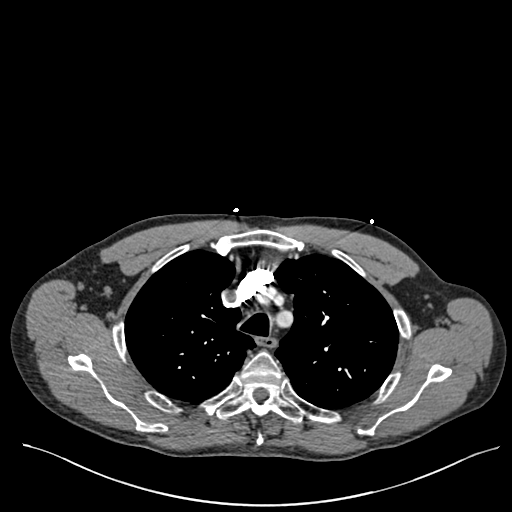
[im 315/383  lung]
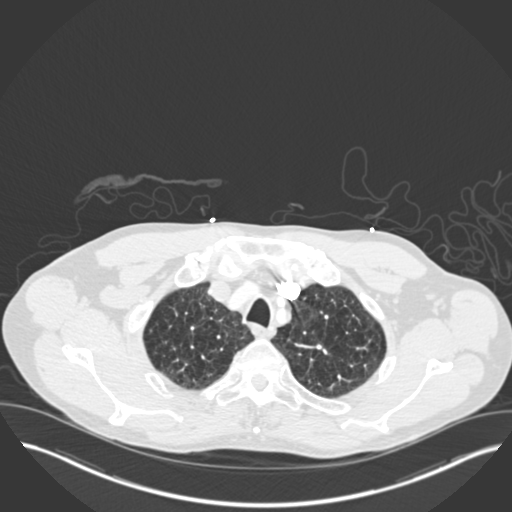
[im 338/383  soft-tissue]
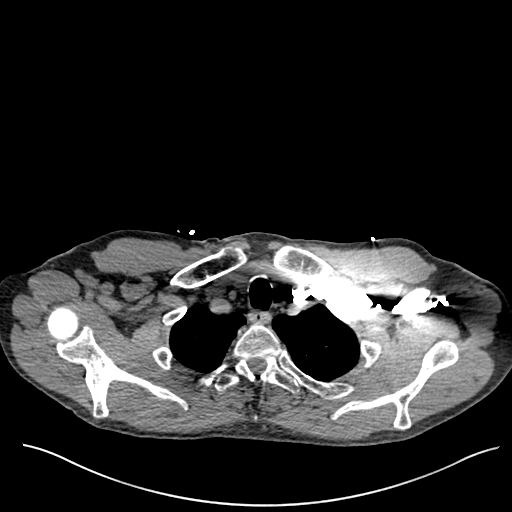
[im 360/383  lung]
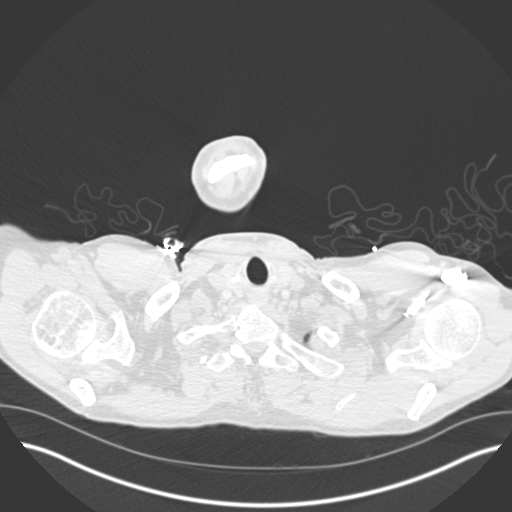

[Series 6: coronal mpr · coronal · 0.78mm/px · 3 of 117 slices shown]
[im 30/117  soft-tissue]
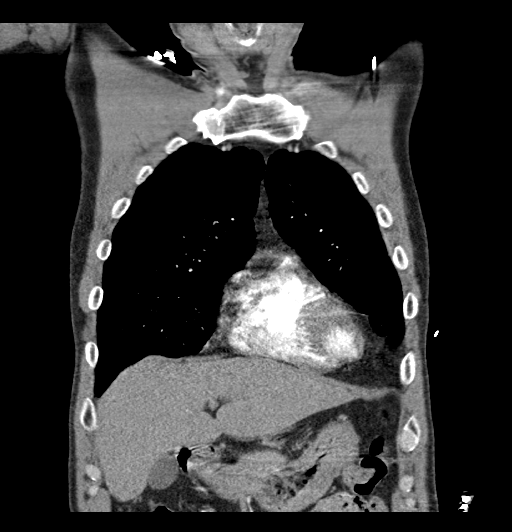
[im 59/117  soft-tissue]
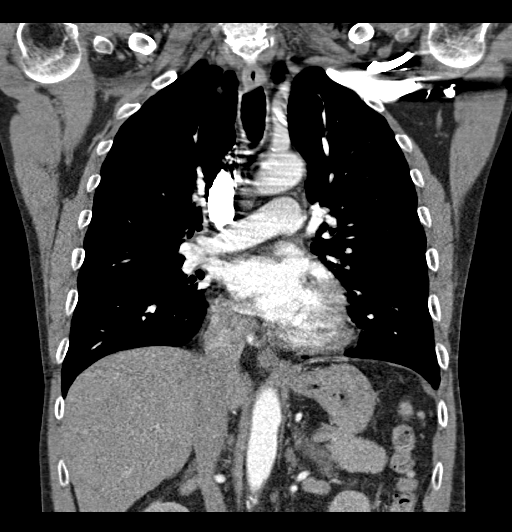
[im 88/117  soft-tissue]
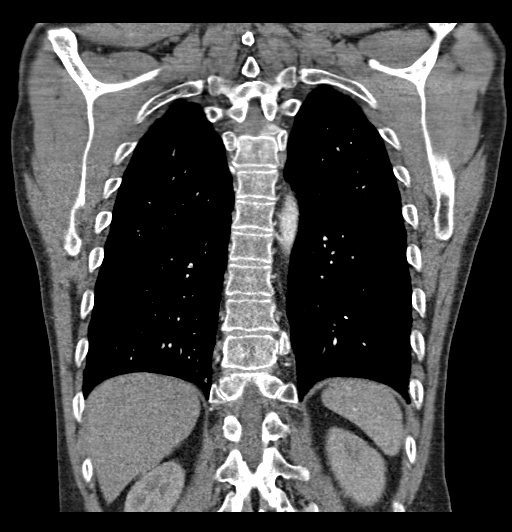

[18 of 46 positions shown; findings below may reference images not displayed]

FINDINGS: Cardiovascular: There is no demonstrable pulmonary embolus. There is
no thoracic aortic aneurysm or dissection. Visualized great vessels
appear unremarkable. Note that the right innominate and left common
carotid arteries arise as a common trunk, an anatomic variant. There
is no appreciable pericardial effusion or pericardial thickening.
There is mild coronary artery calcification.

The main pulmonary outflow tract has a diameter of 3.2 cm,
prominent.

Mediastinum/Nodes: Thyroid appears normal. There are occasional
subcentimeter mediastinal lymph nodes. There are no lymph nodes
meeting size criteria for pathologic significance. No esophageal
lesions are evident.

Lungs/Pleura: There is underlying centrilobular and paraseptal
emphysematous change. There is mild scarring in the bases. There is
mild patchy atelectatic change in the inferior lingula. There is no
edema or consolidation. No pleural effusion or pleural thickening
evident.

Upper Abdomen: There is a benign adenoma in the left adrenal
measuring 1.6 x 1.5 cm. Visualized upper abdominal structures
otherwise appear unremarkable except for atherosclerotic
calcification in the aorta.

Musculoskeletal: There are no blastic or lytic bone lesions. There
are no evident chest wall lesions.

Review of the MIP images confirms the above findings.
IMPRESSION: 1. No demonstrable pulmonary embolus. No thoracic aortic aneurysm or
dissection. Occasional foci of coronary artery calcification noted.

2. Underlying centrilobular emphysematous change. Areas of mild
atelectasis and scarring. No edema or consolidation.

3. Prominence the main pulmonary outflow tract, a finding felt to be
indicative of a degree of pulmonary arterial hypertension.

4.  No evident adenopathy.

5.  Adenoma in left adrenal gland measuring 1.6 x 1.5 cm.

Emphysema ([BV]-[BV]).

## 2018-05-12 MED ORDER — ALBUTEROL (5 MG/ML) CONTINUOUS INHALATION SOLN
10.0000 mg/h | INHALATION_SOLUTION | Freq: Once | RESPIRATORY_TRACT | Status: AC
  Administered 2018-05-12: 10 mg/h via RESPIRATORY_TRACT
  Filled 2018-05-12: qty 20

## 2018-05-12 MED ORDER — PREDNISONE 50 MG PO TABS: ORAL_TABLET | ORAL | 0 refills | Status: DC

## 2018-05-12 MED ORDER — IOPAMIDOL (ISOVUE-370) INJECTION 76%
INTRAVENOUS | Status: AC
  Filled 2018-05-12: qty 100

## 2018-05-12 MED ORDER — IOPAMIDOL (ISOVUE-370) INJECTION 76%
100.0000 mL | Freq: Once | INTRAVENOUS | Status: AC | PRN
  Administered 2018-05-12: 75 mL via INTRAVENOUS

## 2018-05-12 MED ORDER — METHYLPREDNISOLONE SODIUM SUCC 125 MG IJ SOLR
125.0000 mg | Freq: Once | INTRAMUSCULAR | Status: AC
  Administered 2018-05-12: 125 mg via INTRAVENOUS
  Filled 2018-05-12: qty 2

## 2018-05-12 MED ORDER — IPRATROPIUM BROMIDE 0.02 % IN SOLN
0.5000 mg | Freq: Once | RESPIRATORY_TRACT | Status: AC
  Administered 2018-05-12: 0.5 mg via RESPIRATORY_TRACT
  Filled 2018-05-12: qty 2.5

## 2018-05-12 MED ORDER — ALBUTEROL SULFATE HFA 108 (90 BASE) MCG/ACT IN AERS
1.0000 | INHALATION_SPRAY | Freq: Once | RESPIRATORY_TRACT | Status: AC
  Administered 2018-05-12: 1 via RESPIRATORY_TRACT
  Filled 2018-05-12: qty 6.7

## 2018-05-12 NOTE — ED Provider Notes (Addendum)
[facility] DEPT Provider Note   CSN: [id number] Arrival date & time: 05/12/18  3536     History   Chief Complaint Chief Complaint  Patient presents with  . Chest Pain  . Abdominal Pain  . Shortness of Breath    HPI Tony Sullivan is a 57 y.o. male.  57 year old male presents with several days of pleuritic right-sided chest pain with increased dyspnea on exertion.  Does have a history of COPD and uses oxygen at night at 2 L.  States his secretions have not changed.  No fever reported.  No diaphoresis or nausea with it.  Does have some abdominal discomfort which she relates to constipation has been using laxatives for this.  Denies any orthopnea or lower extremity edema.  Has been using his inhalers at home with limited relief.     Past Medical History:  Diagnosis Date  . Asthma   . COPD (chronic obstructive pulmonary disease) ([place]) 2004   diagnosed in 2004, no PFT's to date.  Started on home O2 12/2013, after found to be desatting at PCP's office, and referred to pulmonology.  . High blood pressure   . Seasonal allergies     Patient Active Problem List   Diagnosis Date Noted  . Poor dentition 11/04/2017  . Acute on chronic respiratory failure with hypoxia ([place]) 08/29/2017  . COPD exacerbation ([place]) 08/29/2017  . ETOH abuse 03/30/2017  . Cigarette smoker 03/30/2017  . Acute respiratory failure with hypoxia and hypercapnia ([place]) 03/29/2017  . Special screening for malignant neoplasms, colon   . Benign neoplasm of ascending colon   . Benign neoplasm of descending colon   . Hoarseness 08/22/2016  . GERD (gastroesophageal reflux disease) 08/22/2016  . Atypical chest pain 12/05/2015  . Essential hypertension 06/15/2014  . Pectoralis muscle strain 01/11/2014  . COPD with acute exacerbation ([place]) 01/10/2014  . COPD  GOLD II 01/08/2014  . Smoker 01/08/2014  . Cough 01/08/2014  . Chronic respiratory failure with hypoxia ([place]) 01/08/2014     Past Surgical History:  Procedure Laterality Date  . APPENDECTOMY  1980  . COLONOSCOPY WITH PROPOFOL N/A 12/10/2016   Procedure: COLONOSCOPY WITH PROPOFOL;  Surgeon: [name], MD;  Location: [facility];  Service: Endoscopy;  Laterality: N/A;  . HIP SURGERY Right   . SHOULDER ARTHROSCOPY Right         Home Medications    Prior to Admission medications   Medication Sig Start Date End Date Taking? Authorizing Provider  Aclidinium Bromide (TUDORZA PRESSAIR) 400 MCG/ACT AEPB Inhale 1 puff into the lungs 2 (two) times daily. 11/03/17   [name], MD  albuterol (PROAIR HFA) 108 (90 Base) MCG/ACT inhaler INHALE TWO PUFFS UP TO EVERY 4 HOURS IF NEEDED FOR SHORT OF BREATH 05/04/18   [name], MD  albuterol (PROVENTIL) (2.5 MG/3ML) 0.083% nebulizer solution Take 3 mLs (2.5 mg total) by nebulization every 4 (four) hours as needed for wheezing or shortness of breath. 04/28/18   [name], NP  amLODipine (NORVASC) 10 MG tablet Take 10 mg by mouth every morning.     [provider]  benztropine (COGENTIN) 0.5 MG tablet Take 0.5 mg by mouth at bedtime.     [provider]  budesonide-formoterol (SYMBICORT) 160-4.5 MCG/ACT inhaler INHALE TWO PUFFS INTO THE LUNGS TWO (TWO) TIMES DAILY. 04/28/18   [name], NP  cetirizine (ZYRTEC) 10 MG tablet Take 10 mg by mouth daily.    [provider]  dextromethorphan-guaiFENesin (MUCINEX DM) 30-600 MG 12hr tablet Take 1 tablet by mouth 2 (two) times daily as needed (with flutter valve).    [provider]  doxycycline (VIBRA-TABS) 100 MG tablet Take 100 mg by mouth 2 (two) times daily. x14 days at the beginning of every month as prescribed by PCP    [provider]  famotidine (PEPCID) 20 MG tablet One at bedtime 04/28/18   [name], NP  fluticasone [facility]) 50 MCG/ACT nasal spray Place 2 sprays into both nostrils daily as needed for allergies or rhinitis.    [provider]  levofloxacin (LEVAQUIN) 500 MG tablet Take 1 tablet (500 mg total) by mouth daily. 05/11/18   [name], NP  losartan (COZAAR) 100 MG tablet Take 100 mg by mouth daily.    [provider]  loxapine (LOXITANE) 10 MG capsule Take 10 mg by mouth at bedtime.     [provider]  Multiple Vitamin (MULTIVITAMIN) capsule Take 1 capsule by mouth daily.    [provider]  omeprazole (PRILOSEC) 40 MG capsule Take 40 mg by mouth daily.    [provider]  OXYGEN Inhale 2 L into the lungs as directed. 2-3 liters with sleep and exertion as needed for low oxygen    [provider]  predniSONE (DELTASONE) 10 MG tablet TAKE TWO TABLETS DAILY UNTIL BETTER THEN 1 DAILY 04/28/18   [name], NP  Respiratory Therapy Supplies (FLUTTER) DEVI Use as directed 04/01/16   [name], MD  risperiDONE (RISPERDAL) 2 MG tablet Take 2 mg by mouth at bedtime.    [provider]  traMADol (ULTRAM) 50 MG tablet Take 50 mg by mouth 2 (two) times daily as needed for moderate pain.     [provider]    Family History Family History  Problem Relation Age of Onset  . Emphysema Mother   . Allergies Mother        "everyone in family"  . Asthma Mother        "everyone in family"  . Heart disease Mother   . Clotting disorder Mother   . Cancer Mother   . Emphysema Father   . Allergies Father   . Allergies Son   . Heart disease Maternal Aunt     Social History Social History   Tobacco Use  . Smoking status: Current Some Day Smoker    Packs/day: 0.50    Years: 36.00    Pack years: 18.00    Types: Cigarettes  . Smokeless tobacco: Never Used  Substance Use Topics  . Alcohol use: Yes    Alcohol/week: 8.4 - 12.6 oz    Types: 14 - 21 Standard drinks or equivalent per week    Comment: 2-3 beers/day, with occasional "binges".  No history of withdrawal  . Drug use: No     Allergies   Penicillins; Sulfa antibiotics; Famotidine; and  Levocetirizine   Review of Systems Review of Systems  All other systems reviewed and are negative.    Physical Exam Updated Vital Signs BP 126/90 (BP Location: Right Arm)   Pulse 66   Temp 98 F (36.7 C) (Oral)   Resp 20   Ht 1.803 m (5\' 11" )   Wt 77.1 kg (170 lb)   SpO2 97%   BMI 23.71 kg/m   Physical Exam  Constitutional: He is oriented to person, place, and time. He appears well-developed and well-nourished.  Non-toxic appearance. No distress.  HENT:  Head:  Normocephalic and atraumatic.  Eyes: Pupils are equal, round, and reactive to light. Conjunctivae, EOM and lids are normal.  Neck: Normal range of motion. Neck supple. No tracheal deviation present. No thyroid mass present.  Cardiovascular: Normal rate, regular rhythm and normal heart sounds. Exam reveals no gallop.  No murmur heard. Pulmonary/Chest: Effort normal. No stridor. No respiratory distress. He has decreased breath sounds in the right lower field and the left lower field. He has wheezes in the right lower field and the left lower field. He has no rhonchi. He has no rales.  Abdominal: Soft. Normal appearance and bowel sounds are normal. He exhibits no distension. There is no tenderness. There is no rebound and no CVA tenderness.  Musculoskeletal: Normal range of motion. He exhibits no edema or tenderness.  Neurological: He is alert and oriented to person, place, and time. He has normal strength. No cranial nerve deficit or sensory deficit. GCS eye subscore is 4. GCS verbal subscore is 5. GCS motor subscore is 6.  Skin: Skin is warm and dry. No abrasion and no rash noted.  Psychiatric: He has a normal mood and affect. His speech is normal and behavior is normal.  Nursing note and vitals reviewed.    ED Treatments / Results  Labs (all labs ordered are listed, but only abnormal results are displayed) Labs Reviewed  CBC - Abnormal; Notable for the following components:      Result Value   WBC 12.2 (*)     Hemoglobin 17.6 (*)    All other components within normal limits  BASIC METABOLIC PANEL  TROPONIN I    EKG EKG Interpretation  Date/Time:  Tuesday May 12 2018 08:37:51 EDT Ventricular Rate:  72 PR Interval:    QRS Duration: 89 QT Interval:  404 QTC Calculation: 443 R Axis:   -75 Text Interpretation:  Sinus rhythm Right atrial enlargement Left anterior fascicular block Probable anteroseptal infarct, old Baseline wander in lead(s) V1 Confirmed by Lacretia Leigh ([id number]) on 05/12/2018 9:07:39 AM   Radiology Dg Chest 2 View  Result Date: 05/12/2018 CLINICAL DATA:  Two week history of progressively worsening shortness of breath, chest discomfort and abdominal pain. Current history of COPD. EXAM: CHEST - 2 VIEW COMPARISON:  05/04/2018, 08/28/2017 and earlier. FINDINGS: Cardiomediastinal silhouette unremarkable, unchanged. Emphysematous changes throughout both lungs with mild hyperinflation, unchanged. Mildly prominent bronchovascular markings diffusely and mild central peribronchial thickening, unchanged. Lungs otherwise clear. No localized airspace consolidation. No pleural effusions. No pneumothorax. Normal pulmonary vascularity. Visualized bony thorax intact. IMPRESSION: 1.  No acute cardiopulmonary disease. 2.  Emphysema (ICD10-J43.9). Electronically Signed   By: Evangeline Dakin M.D.   On: 05/12/2018 09:19    Procedures Procedures (including critical care time)  Medications Ordered in ED Medications  albuterol (PROVENTIL,VENTOLIN) solution continuous neb (has no administration in time range)  methylPREDNISolone sodium succinate (SOLU-MEDROL) 125 mg/2 mL injection 125 mg (has no administration in time range)  ipratropium (ATROVENT) nebulizer solution 0.5 mg (has no administration in time range)     Initial Impression / Assessment and Plan / ED Course  I have reviewed the triage vital signs and the nursing notes.  Pertinent labs & imaging results that were available during my care  of the patient were reviewed by me and considered in my medical decision making (see chart for details).     Patient given albuterol treatments along with Solu-Medrol.  Patient right-sided pleuritic chest pain and had a CT angio of chest which showed no pulmonary embolism.  He  was ambulated in the department and had a pulse oximetry of 92% and feels better.  He has a home nebulizer and has solutions for it.  He also has home oxygen.  Patient feels comfortable to go home with and will place patient on prednisone for 5 days.  Gave him a rescue had to go home with.  Return precautions given  CRITICAL CARE Performed by: Leota Jacobsen Total critical care time: 50 minutes Critical care time was exclusive of separately billable procedures and treating other patients. Critical care was necessary to treat or prevent imminent or life-threatening deterioration. Critical care was time spent personally by me on the following activities: development of treatment plan with patient and/or surrogate as well as nursing, discussions with consultants, evaluation of patient's response to treatment, examination of patient, obtaining history from patient or surrogate, ordering and performing treatments and interventions, ordering and review of laboratory studies, ordering and review of radiographic studies, pulse oximetry and re-evaluation of patient's condition.   Final Clinical Impressions(s) / ED Diagnoses   Final diagnoses:  None    ED Discharge Orders    None       Lacretia Leigh, MD 05/12/18 1323    Lacretia Leigh, MD 05/12/18 1327

## 2018-05-12 NOTE — Discharge Instructions (Addendum)
Use your home nebulizer every 4 hours for the next 1 or 2 days depending upon how you feel.  Take the prednisone as directed.  Use oxygen as you need to.  Return here for any severe trouble breathing

## 2018-05-12 NOTE — ED Notes (Signed)
RN ambulted pt around the department on 2L  O2 [place].  Pt's O2 sats remained between 92-100% with a HR between 100-107

## 2018-05-12 NOTE — ED Triage Notes (Signed)
Per Pt: Pt reports he originally had abdominal pain that has moved to his chest and is now having SOB. Pt has a hx of COPD and is on laxatives and omeprazole at home. Pt also reports he takes a stool softener daily. Pt reports pain on his right side.  Pt reports he cannot lay down without pain in his back and chest.

## 2018-05-21 ENCOUNTER — Ambulatory Visit (INDEPENDENT_AMBULATORY_CARE_PROVIDER_SITE_OTHER): Payer: Medicaid Other | Admitting: Internal Medicine

## 2018-05-21 ENCOUNTER — Encounter: Payer: Self-pay | Admitting: Internal Medicine

## 2018-05-21 VITALS — BP 106/72 | HR 80 | Ht 70.0 in | Wt 166.0 lb

## 2018-05-21 DIAGNOSIS — R140 Abdominal distension (gaseous): Secondary | ICD-10-CM | POA: Diagnosis not present

## 2018-05-21 DIAGNOSIS — R1011 Right upper quadrant pain: Secondary | ICD-10-CM | POA: Diagnosis not present

## 2018-05-21 DIAGNOSIS — Z86010 Personal history of colonic polyps: Secondary | ICD-10-CM | POA: Diagnosis not present

## 2018-05-21 NOTE — Patient Instructions (Signed)
You have been scheduled for an abdominal ultrasound at [facility] (1st floor of hospital) on 05/28/2018 at 12:30. Please arrive 15 minutes prior to your appointment for registration. Make certain not to have anything to eat or drink after midnight hours prior to your appointment. Should you need to reschedule your appointment, please contact radiology at [phone]. This test typically takes about 30 minutes to perform.

## 2018-05-21 NOTE — Progress Notes (Signed)
HISTORY OF PRESENT ILLNESS:  Tony Sullivan is a 57 y.o. male with advanced oxygen-dependent COPD who presents today with a chief complaint of right-sided abdominal pain, postprandial fullness which affects his breathing. I saw the patient in the office December 2017 regarding screening colonoscopy. At that time he complained of vague epigastric fullness with bloating. This was felt to be due to his intrinsic lung disease. Also GERD by history but no symptoms on PPI. See that dictation. He subsequently underwent colonoscopy at the hospital with monitored anesthesia care February 2018. He was found to have multiple and advanced (size) adenomas. Follow-up in 3 years if medically fit recommended. Patient tells me that he has had problems with right-sided discomfort for several months. Seems to be right upper quadrant and right side. Seen in the emergency room 05/12/2018 (last week) for chest pain, abdominal pain, and shortness of breath. Laboratories revealed unremarkable basic metabolic panel. CBC with hemoglobin 17.6 and white blood cell count 12.2. Chest x-ray without acute cardiopulmonary disease. The patient tells me that he moves his bowels daily. His right-sided discomfort is not necessarily affected by meals or movement might be at times continues with chronic postprandial bloating and fullness which seems to exacerbate his breathing. He continues on oxygen chronically. Earlier this week he was placed on pantoprazole by his PCP. GI review of systems is otherwise negative. Since his last office visit he has gained 12 pounds. He continues to smoke. CT scan August 2016 to evaluate left lower quadrant pain radiating into the right abdomen revealed possible infectious or inflammatory enteritis.Marland Kitchen  REVIEW OF SYSTEMS:  All non-GI ROS negative except for ankle swelling, shortness of breath, arthritis, sinus and allergy, anxiety, depression, cough  Past Medical History:  Diagnosis Date  . Asthma   . COPD  (chronic obstructive pulmonary disease) ([place]) 2004   diagnosed in 2004, no PFT's to date.  Started on home O2 12/2013, after found to be desatting at PCP's office, and referred to pulmonology.  . High blood pressure   . Seasonal allergies     Past Surgical History:  Procedure Laterality Date  . APPENDECTOMY  1980  . COLONOSCOPY WITH PROPOFOL N/A 12/10/2016   Procedure: COLONOSCOPY WITH PROPOFOL;  Surgeon: [name], MD;  Location: [facility];  Service: Endoscopy;  Laterality: N/A;  . HIP SURGERY Right   . SHOULDER ARTHROSCOPY Right     Social History Tony Sullivan  reports that he has been smoking cigarettes.  He has a 18.00 pack-year smoking history. He has never used smokeless tobacco. He reports that he drinks about 8.4 - 12.6 oz of alcohol per week. He reports that he does not use drugs.  family history includes Allergies in his father, mother, and son; Asthma in his mother; Cancer in his mother; Clotting disorder in his mother; Emphysema in his father and mother; Heart disease in his maternal aunt and mother.  Allergies  Allergen Reactions  . Penicillins Anaphylaxis    Has patient had a PCN reaction causing immediate rash, facial/tongue/throat swelling, SOB or lightheadedness with hypotension: Yes Has patient had a PCN reaction causing severe rash involving mucus membranes or skin necrosis: No Has patient had a PCN reaction that required hospitalization No Has patient had a PCN reaction occurring within the last 10 years: No If all of the above answers are "NO", then may proceed with Cephalosporin use.   . Sulfa Antibiotics Swelling    Swelling of hands and throat   . Famotidine Other (See Comments)  Bloating of stomach, loss of appetite  . Levocetirizine Other (See Comments)    Muscle cramps       PHYSICAL EXAMINATION: Vital signs: BP 106/72 (BP Location: Left Arm, Patient Position: Sitting, Cuff Size: Normal)   Pulse 80   Ht 5\' 10"  (1.778 m) Comment: height  meausred without shoes  Wt 166 lb (75.3 kg)   BMI 23.82 kg/m   Constitutional: chronically ill-appearing with nasal cannula oxygen, no acute distress Psychiatric: alert and oriented x3, cooperative Eyes: extraocular movements intact, anicteric, conjunctiva pink Mouth: oral pharynx moist, no lesions Neck: supple no lymphadenopathy Cardiovascular: heart regular rate and rhythm, no murmur Lungs: clear to auscultation bilaterally Abdomen: soft, nontender, nondistended, no obvious ascites, no peritoneal signs, normal bowel sounds, no organomegaly Rectal:omitted Extremities: no clubbing, cyanosis, or lower extremity edema bilaterally Skin: no lesions on visible extremities Neuro: No focal deficits. Cranial nerves intact  ASSESSMENT:  #1. Vague right-sided abdominal discomfort. Rule out gallstones. Rule out musculoskeletal. #2. Chronic postprandial bloating and discomfort which seems to interfere with his breathing. #3. End-stage lung disease #4. History of multiple adenomatous colon polyps  PLAN:  #1. Continue PPI #2. Schedule abdominal ultrasound #3. Discussion on the recruitment of extra respiratory muscles (abdominal muscles) to assist with breathing in patients with end-stage lung disease and how the sensation of postprandial bloating with difficulty breathing might occur #4. Consider surveillance colonoscopy 2021 based on overall health status #5. Stop smoking #6. Consider advanced imaging if ultrasound negative and symptoms persist or worsen  25 minutes of face-to-face with the patient. Greater than 50% of the time spent counseling regarding his right-sided abdominal discomfort and chronic postprandial bloating issues

## 2018-05-23 ENCOUNTER — Other Ambulatory Visit: Payer: Self-pay

## 2018-05-23 ENCOUNTER — Inpatient Hospital Stay (HOSPITAL_COMMUNITY)
Admission: EM | Admit: 2018-05-23 | Discharge: 2018-05-25 | DRG: 190 | Disposition: A | Discharge status: Home / Self Care | Payer: Medicaid Other | Attending: Internal Medicine | Admitting: Internal Medicine

## 2018-05-23 ENCOUNTER — Emergency Department (HOSPITAL_COMMUNITY): Payer: Medicaid Other

## 2018-05-23 ENCOUNTER — Encounter (HOSPITAL_COMMUNITY): Payer: Self-pay | Admitting: Emergency Medicine

## 2018-05-23 DIAGNOSIS — R109 Unspecified abdominal pain: Secondary | ICD-10-CM

## 2018-05-23 DIAGNOSIS — F1010 Alcohol abuse, uncomplicated: Secondary | ICD-10-CM | POA: Diagnosis present

## 2018-05-23 DIAGNOSIS — Z9981 Dependence on supplemental oxygen: Secondary | ICD-10-CM | POA: Diagnosis not present

## 2018-05-23 DIAGNOSIS — F17210 Nicotine dependence, cigarettes, uncomplicated: Secondary | ICD-10-CM | POA: Diagnosis present

## 2018-05-23 DIAGNOSIS — Z882 Allergy status to sulfonamides status: Secondary | ICD-10-CM | POA: Diagnosis not present

## 2018-05-23 DIAGNOSIS — I959 Hypotension, unspecified: Secondary | ICD-10-CM | POA: Diagnosis present

## 2018-05-23 DIAGNOSIS — Z888 Allergy status to other drugs, medicaments and biological substances status: Secondary | ICD-10-CM

## 2018-05-23 DIAGNOSIS — Z881 Allergy status to other antibiotic agents status: Secondary | ICD-10-CM

## 2018-05-23 DIAGNOSIS — Z720 Tobacco use: Secondary | ICD-10-CM | POA: Diagnosis present

## 2018-05-23 DIAGNOSIS — F419 Anxiety disorder, unspecified: Secondary | ICD-10-CM | POA: Diagnosis present

## 2018-05-23 DIAGNOSIS — Z8249 Family history of ischemic heart disease and other diseases of the circulatory system: Secondary | ICD-10-CM | POA: Diagnosis not present

## 2018-05-23 DIAGNOSIS — Z79891 Long term (current) use of opiate analgesic: Secondary | ICD-10-CM | POA: Diagnosis not present

## 2018-05-23 DIAGNOSIS — Z716 Tobacco abuse counseling: Secondary | ICD-10-CM | POA: Diagnosis not present

## 2018-05-23 DIAGNOSIS — J9621 Acute and chronic respiratory failure with hypoxia: Secondary | ICD-10-CM | POA: Diagnosis present

## 2018-05-23 DIAGNOSIS — Z7952 Long term (current) use of systemic steroids: Secondary | ICD-10-CM

## 2018-05-23 DIAGNOSIS — R0602 Shortness of breath: Secondary | ICD-10-CM | POA: Diagnosis present

## 2018-05-23 DIAGNOSIS — J441 Chronic obstructive pulmonary disease with (acute) exacerbation: Secondary | ICD-10-CM | POA: Diagnosis not present

## 2018-05-23 DIAGNOSIS — K5900 Constipation, unspecified: Secondary | ICD-10-CM | POA: Diagnosis not present

## 2018-05-23 DIAGNOSIS — K219 Gastro-esophageal reflux disease without esophagitis: Secondary | ICD-10-CM | POA: Diagnosis present

## 2018-05-23 DIAGNOSIS — R252 Cramp and spasm: Secondary | ICD-10-CM | POA: Diagnosis not present

## 2018-05-23 DIAGNOSIS — F17200 Nicotine dependence, unspecified, uncomplicated: Secondary | ICD-10-CM | POA: Diagnosis present

## 2018-05-23 DIAGNOSIS — F329 Major depressive disorder, single episode, unspecified: Secondary | ICD-10-CM | POA: Diagnosis present

## 2018-05-23 DIAGNOSIS — Z7951 Long term (current) use of inhaled steroids: Secondary | ICD-10-CM | POA: Diagnosis not present

## 2018-05-23 DIAGNOSIS — Z79899 Other long term (current) drug therapy: Secondary | ICD-10-CM

## 2018-05-23 DIAGNOSIS — J9611 Chronic respiratory failure with hypoxia: Secondary | ICD-10-CM | POA: Diagnosis present

## 2018-05-23 DIAGNOSIS — I10 Essential (primary) hypertension: Secondary | ICD-10-CM | POA: Diagnosis present

## 2018-05-23 DIAGNOSIS — Z825 Family history of asthma and other chronic lower respiratory diseases: Secondary | ICD-10-CM | POA: Diagnosis not present

## 2018-05-23 DIAGNOSIS — Z880 Allergy status to penicillin: Secondary | ICD-10-CM | POA: Diagnosis not present

## 2018-05-23 DIAGNOSIS — R1011 Right upper quadrant pain: Secondary | ICD-10-CM | POA: Diagnosis not present

## 2018-05-23 DIAGNOSIS — J302 Other seasonal allergic rhinitis: Secondary | ICD-10-CM | POA: Diagnosis present

## 2018-05-23 DIAGNOSIS — J40 Bronchitis, not specified as acute or chronic: Secondary | ICD-10-CM

## 2018-05-23 LAB — BASIC METABOLIC PANEL
Anion gap: 11 (ref 5–15)
BUN: 13 mg/dL (ref 6–20)
CO2: 26 mmol/L (ref 22–32)
Calcium: 8.8 mg/dL — ABNORMAL LOW (ref 8.9–10.3)
Chloride: 106 mmol/L (ref 98–111)
Creatinine, Ser: 0.87 mg/dL (ref 0.61–1.24)
GFR calc Af Amer: >60 mL/min (ref >60)
GFR calc non Af Amer: >60 mL/min (ref >60)
Glucose, Bld: 95 mg/dL (ref 70–99)
Potassium: 3.7 mmol/L (ref 3.5–5.1)
Sodium: 143 mmol/L (ref 135–145)

## 2018-05-23 LAB — CBC WITH DIFFERENTIAL/PLATELET
Basophils Absolute: 0 10*3/uL (ref 0.0–0.1)
Basophils Relative: 0 %
Eosinophils Absolute: 0.2 10*3/uL (ref 0.0–0.7)
Eosinophils Relative: 1 %
HCT: 54.3 % — ABNORMAL HIGH (ref 39.0–52.0)
Hemoglobin: 18.2 g/dL — ABNORMAL HIGH (ref 13.0–17.0)
Lymphocytes Relative: 34 %
Lymphs Abs: 6.4 10*3/uL — ABNORMAL HIGH (ref 0.7–4.0)
MCH: 33.3 pg (ref 26.0–34.0)
MCHC: 33.5 g/dL (ref 30.0–36.0)
MCV: 99.5 fL (ref 78.0–100.0)
Monocytes Absolute: 1.7 10*3/uL — ABNORMAL HIGH (ref 0.1–1.0)
Monocytes Relative: 9 %
Neutro Abs: 10.5 10*3/uL — ABNORMAL HIGH (ref 1.7–7.7)
Neutrophils Relative %: 56 %
Platelets: 243 10*3/uL (ref 150–400)
RBC: 5.46 MIL/uL (ref 4.22–5.81)
RDW: 16 % — ABNORMAL HIGH (ref 11.5–15.5)
WBC: 18.8 10*3/uL — ABNORMAL HIGH (ref 4.0–10.5)

## 2018-05-23 LAB — I-STAT TROPONIN, ED: Troponin i, poc: 0 ng/mL (ref 0.00–0.08)

## 2018-05-23 IMAGING — DX DG CHEST 1V PORT
2 series · 2 of 2 positions shown · non-contrast
Comparison: [DATE]

CLINICAL DATA: Shortness of breath and chest pain. History of COPD,
asthma, current smoker.

EXAM:
PORTABLE CHEST 1 VIEW

[chest ap (1 of 2)]
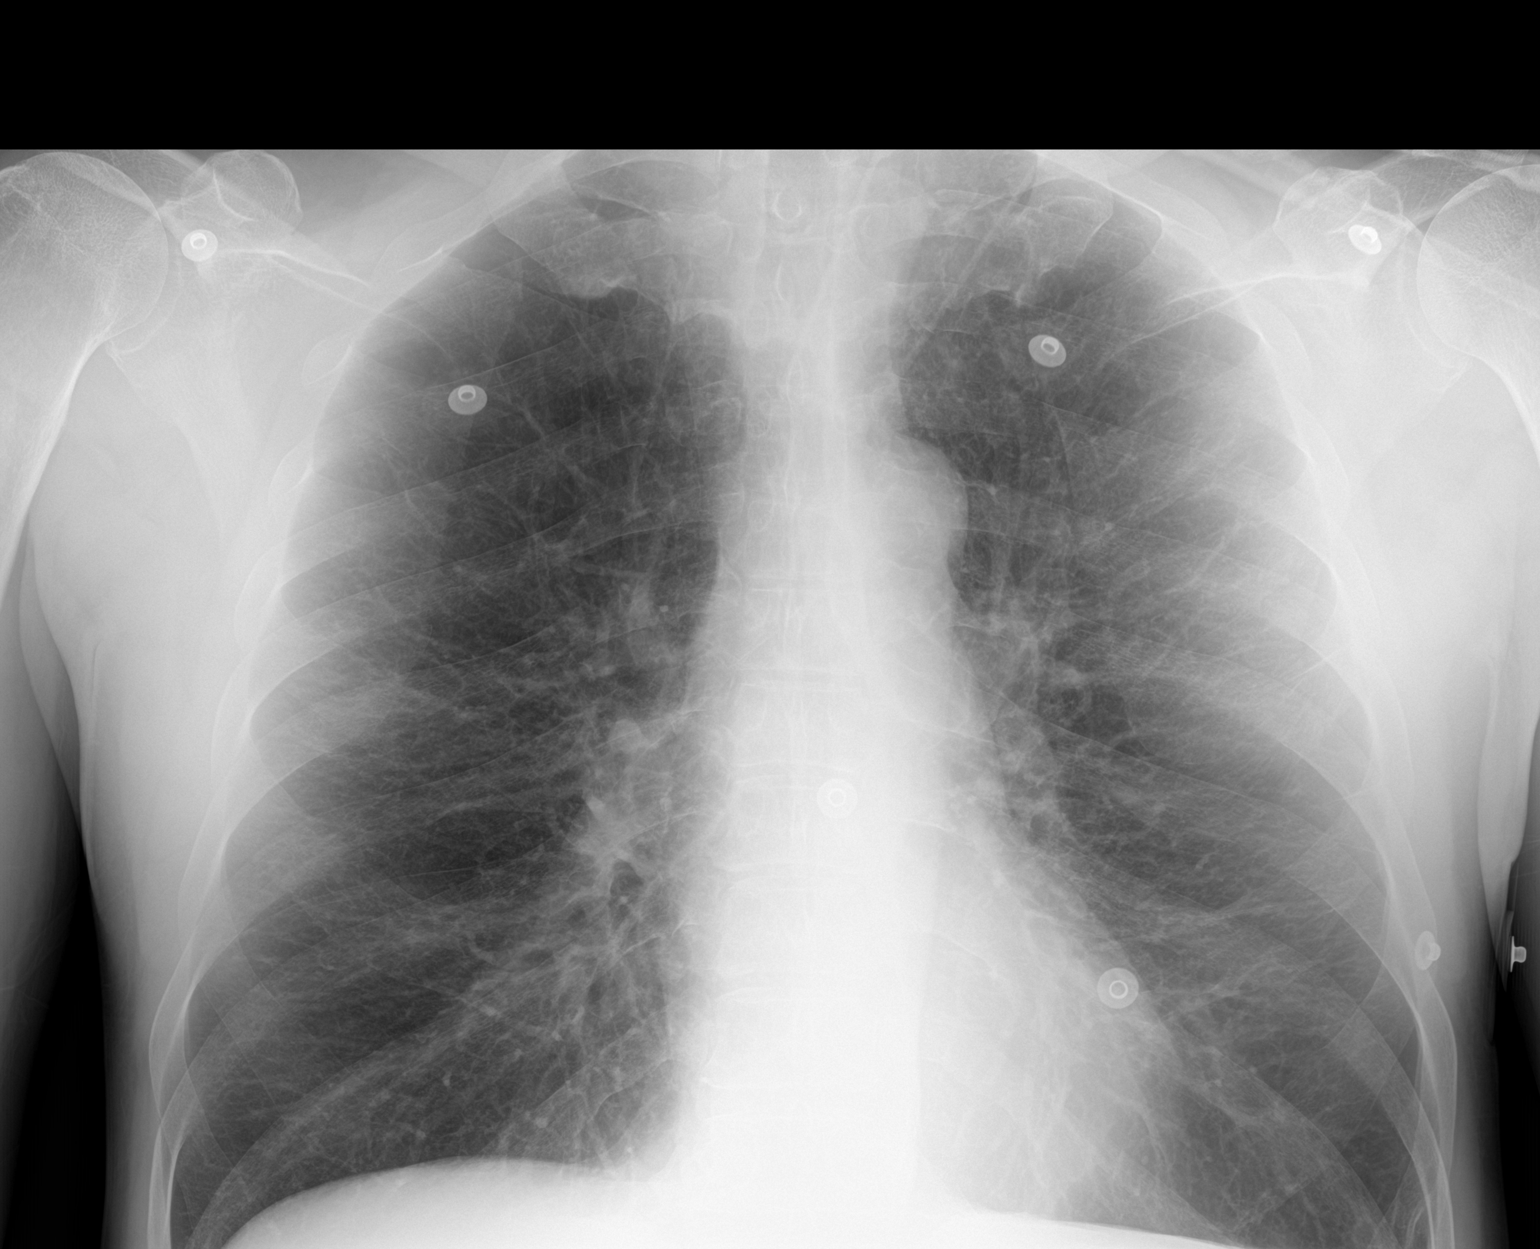

[chest ap (2 of 2)]
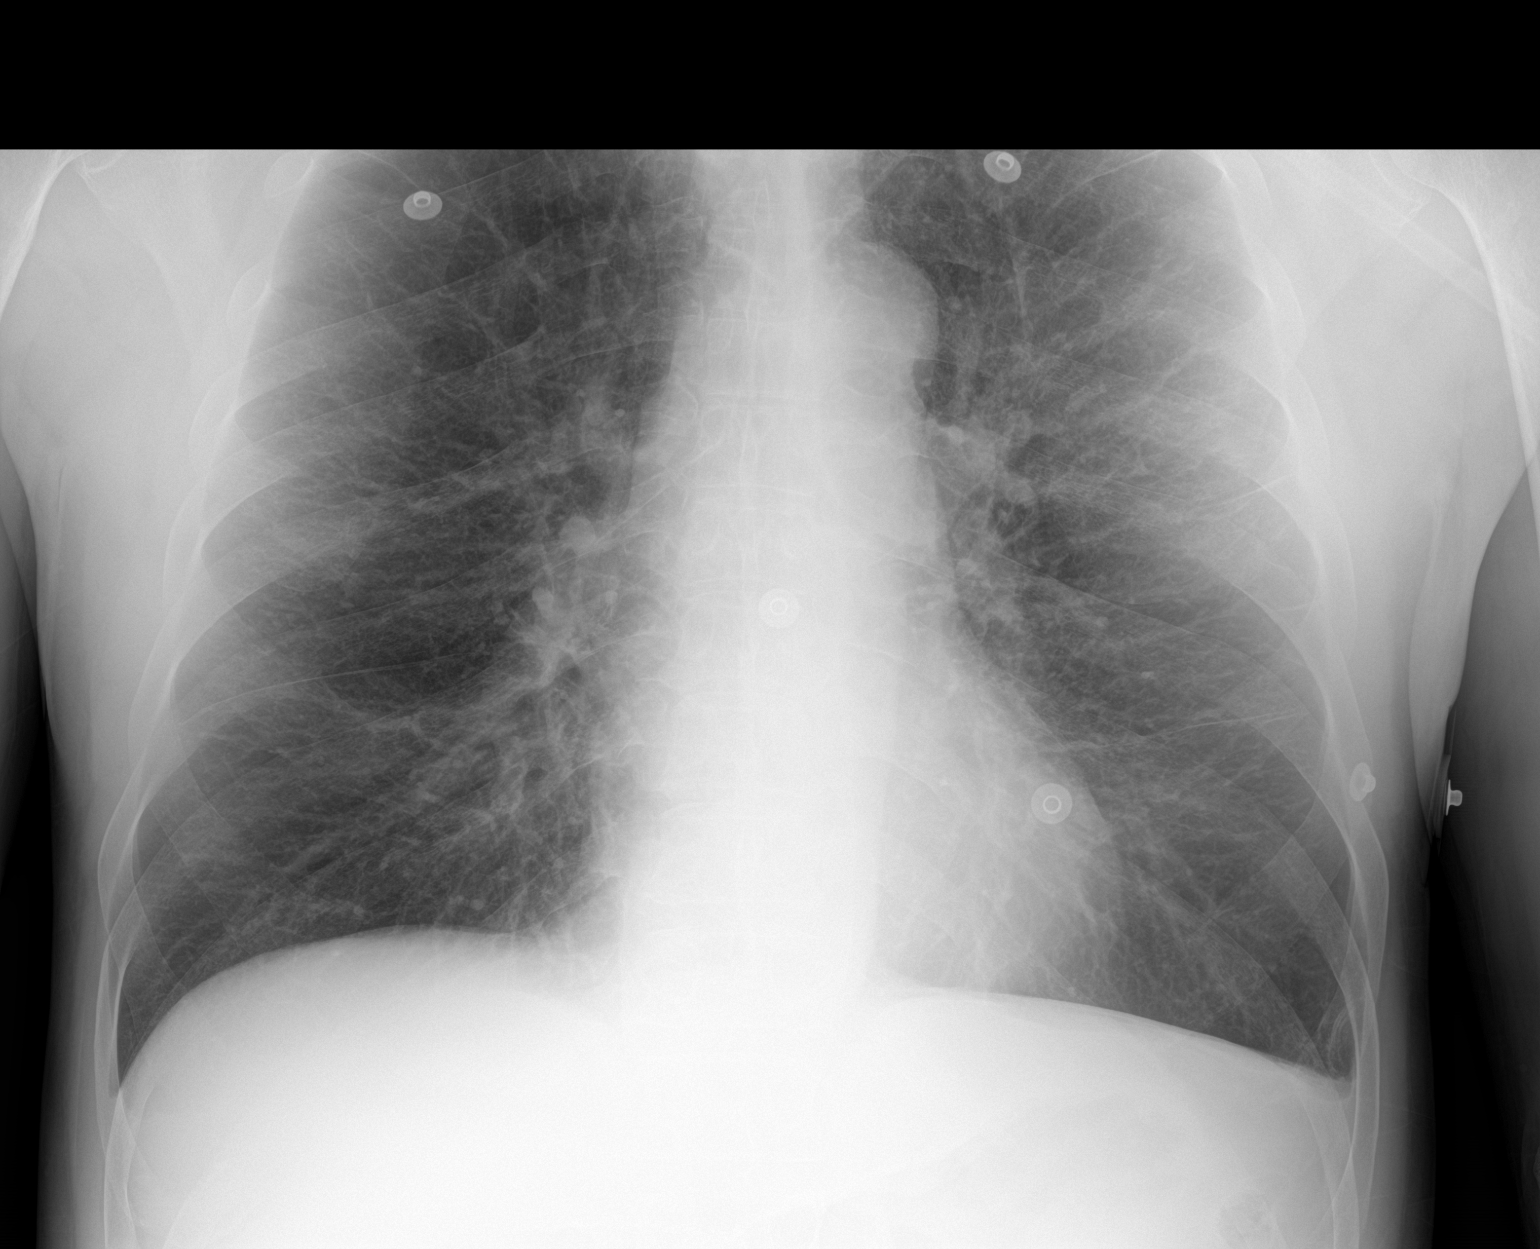

[2 of 2 positions shown; findings below may reference images not displayed]

FINDINGS: Heart size and pulmonary vascularity are normal. Emphysematous
changes in the lungs. Central interstitial pattern consistent with
chronic bronchitis. No airspace disease or consolidation in the
lungs. No blunting of costophrenic angles. No pneumothorax.
Mediastinal contours appear intact.
IMPRESSION: Emphysematous and chronic bronchitic changes in the lungs. No
evidence of active pulmonary disease.

## 2018-05-23 MED ORDER — LOXAPINE SUCCINATE 5 MG PO CAPS
10.0000 mg | ORAL_CAPSULE | Freq: Every day | ORAL | Status: DC
  Filled 2018-05-23 (×2): qty 2

## 2018-05-23 MED ORDER — FOLIC ACID 1 MG PO TABS
1.0000 mg | ORAL_TABLET | Freq: Every day | ORAL | Status: DC
  Administered 2018-05-23 – 2018-05-24 (×2): 1 mg via ORAL
  Filled 2018-05-23 (×2): qty 1

## 2018-05-23 MED ORDER — RISPERIDONE 2 MG PO TABS
2.0000 mg | ORAL_TABLET | Freq: Every day | ORAL | Status: DC
  Filled 2018-05-23 (×2): qty 1

## 2018-05-23 MED ORDER — ADULT MULTIVITAMIN W/MINERALS CH
1.0000 | ORAL_TABLET | Freq: Every day | ORAL | Status: DC
  Administered 2018-05-23 (×2): 1 via ORAL
  Filled 2018-05-23: qty 1

## 2018-05-23 MED ORDER — BENZTROPINE MESYLATE 0.5 MG PO TABS
0.5000 mg | ORAL_TABLET | Freq: Every day | ORAL | Status: DC
  Filled 2018-05-23 (×2): qty 1

## 2018-05-23 MED ORDER — LORAZEPAM 1 MG PO TABS: 1.0000 mg | ORAL_TABLET | Freq: Four times a day (QID) | ORAL | Status: DC | PRN

## 2018-05-23 MED ORDER — ALBUTEROL (5 MG/ML) CONTINUOUS INHALATION SOLN
10.0000 mg/h | INHALATION_SOLUTION | RESPIRATORY_TRACT | Status: DC
  Administered 2018-05-23 (×2): 10 mg/h via RESPIRATORY_TRACT
  Filled 2018-05-23 (×2): qty 20

## 2018-05-23 MED ORDER — TIOTROPIUM BROMIDE MONOHYDRATE 18 MCG IN CAPS
18.0000 ug | ORAL_CAPSULE | Freq: Every day | RESPIRATORY_TRACT | Status: DC
  Administered 2018-05-24 – 2018-05-25 (×3): 18 ug via RESPIRATORY_TRACT
  Filled 2018-05-23: qty 5

## 2018-05-23 MED ORDER — ADULT MULTIVITAMIN W/MINERALS CH
1.0000 | ORAL_TABLET | Freq: Every day | ORAL | Status: DC
  Administered 2018-05-23 – 2018-05-24 (×2): 1 via ORAL
  Filled 2018-05-23 (×2): qty 1

## 2018-05-23 MED ORDER — AMLODIPINE BESYLATE 10 MG PO TABS
10.0000 mg | ORAL_TABLET | Freq: Every day | ORAL | Status: DC
  Administered 2018-05-23 – 2018-05-24 (×2): 10 mg via ORAL
  Filled 2018-05-23 (×2): qty 1

## 2018-05-23 MED ORDER — POTASSIUM CHLORIDE IN NACL 20-0.9 MEQ/L-% IV SOLN: INTRAVENOUS | Status: DC

## 2018-05-23 MED ORDER — POTASSIUM CHLORIDE IN NACL 20-0.9 MEQ/L-% IV SOLN
INTRAVENOUS | Status: DC
  Administered 2018-05-23 – 2018-05-24 (×2): via INTRAVENOUS
  Filled 2018-05-23 (×3): qty 1000

## 2018-05-23 MED ORDER — ONDANSETRON HCL 4 MG/2ML IJ SOLN: 4.0000 mg | Freq: Four times a day (QID) | INTRAMUSCULAR | Status: DC | PRN

## 2018-05-23 MED ORDER — TRAMADOL HCL 50 MG PO TABS
50.0000 mg | ORAL_TABLET | Freq: Three times a day (TID) | ORAL | Status: DC | PRN
Start: 2018-05-23 — End: 2018-05-25
  Administered 2018-05-23: 50 mg via ORAL
  Filled 2018-05-23: qty 1

## 2018-05-23 MED ORDER — MULTIVITAMINS PO CAPS: 1.0000 | ORAL_CAPSULE | Freq: Every day | ORAL | Status: DC

## 2018-05-23 MED ORDER — LOSARTAN POTASSIUM 50 MG PO TABS
100.0000 mg | ORAL_TABLET | Freq: Every day | ORAL | Status: DC
  Administered 2018-05-23 – 2018-05-24 (×2): 100 mg via ORAL
  Filled 2018-05-23 (×2): qty 2

## 2018-05-23 MED ORDER — ALBUTEROL SULFATE (2.5 MG/3ML) 0.083% IN NEBU
2.5000 mg | INHALATION_SOLUTION | Freq: Two times a day (BID) | RESPIRATORY_TRACT | Status: DC
  Administered 2018-05-23: 2.5 mg via RESPIRATORY_TRACT

## 2018-05-23 MED ORDER — VITAMIN B-1 100 MG PO TABS
100.0000 mg | ORAL_TABLET | Freq: Every day | ORAL | Status: DC
  Administered 2018-05-23 – 2018-05-24 (×2): 100 mg via ORAL
  Filled 2018-05-23 (×2): qty 1

## 2018-05-23 MED ORDER — LEVOFLOXACIN 500 MG PO TABS
500.0000 mg | ORAL_TABLET | Freq: Every day | ORAL | Status: DC
  Administered 2018-05-24: 500 mg via ORAL
  Filled 2018-05-23: qty 1

## 2018-05-23 MED ORDER — LEVOFLOXACIN IN D5W 750 MG/150ML IV SOLN
750.0000 mg | Freq: Once | INTRAVENOUS | Status: AC
  Administered 2018-05-23: 750 mg via INTRAVENOUS
  Filled 2018-05-23: qty 150

## 2018-05-23 MED ORDER — TRAMADOL HCL 50 MG PO TABS: 50.0000 mg | ORAL_TABLET | Freq: Three times a day (TID) | ORAL | Status: DC | PRN

## 2018-05-23 MED ORDER — ALBUTEROL SULFATE (2.5 MG/3ML) 0.083% IN NEBU
2.5000 mg | INHALATION_SOLUTION | Freq: Three times a day (TID) | RESPIRATORY_TRACT | Status: DC
  Administered 2018-05-24 – 2018-05-25 (×3): 2.5 mg via RESPIRATORY_TRACT
  Filled 2018-05-23 (×4): qty 3

## 2018-05-23 MED ORDER — ENOXAPARIN SODIUM 40 MG/0.4ML ~~LOC~~ SOLN
40.0000 mg | SUBCUTANEOUS | Status: DC
  Administered 2018-05-23 – 2018-05-24 (×2): 40 mg via SUBCUTANEOUS
  Filled 2018-05-23 (×2): qty 0.4

## 2018-05-23 MED ORDER — LORAZEPAM 2 MG/ML IJ SOLN: 1.0000 mg | Freq: Four times a day (QID) | INTRAMUSCULAR | Status: DC | PRN

## 2018-05-23 MED ORDER — ACETAMINOPHEN 325 MG PO TABS: 650.0000 mg | ORAL_TABLET | Freq: Four times a day (QID) | ORAL | Status: DC | PRN

## 2018-05-23 MED ORDER — ONDANSETRON HCL 4 MG PO TABS
4.0000 mg | ORAL_TABLET | Freq: Four times a day (QID) | ORAL | Status: DC | PRN
Start: 2018-05-23 — End: 2018-05-25

## 2018-05-23 MED ORDER — FAMOTIDINE 20 MG PO TABS
20.0000 mg | ORAL_TABLET | Freq: Every day | ORAL | Status: DC
  Administered 2018-05-23 – 2018-05-24 (×2): 20 mg via ORAL
  Filled 2018-05-23 (×2): qty 1

## 2018-05-23 MED ORDER — THIAMINE HCL 100 MG/ML IJ SOLN: 100.0000 mg | Freq: Every day | INTRAMUSCULAR | Status: DC

## 2018-05-23 MED ORDER — CYCLOBENZAPRINE HCL 5 MG PO TABS
5.0000 mg | ORAL_TABLET | Freq: Once | ORAL | Status: AC
  Administered 2018-05-24: 5 mg via ORAL
  Filled 2018-05-23: qty 1

## 2018-05-23 MED ORDER — TIOTROPIUM BROMIDE MONOHYDRATE 18 MCG IN CAPS
18.0000 ug | ORAL_CAPSULE | Freq: Every day | RESPIRATORY_TRACT | Status: DC
  Administered 2018-05-23: 18 ug via RESPIRATORY_TRACT
  Filled 2018-05-23: qty 5

## 2018-05-23 MED ORDER — METHYLPREDNISOLONE SODIUM SUCC 125 MG IJ SOLR
125.0000 mg | Freq: Once | INTRAMUSCULAR | Status: AC
  Administered 2018-05-23: 125 mg via INTRAVENOUS
  Filled 2018-05-23: qty 2

## 2018-05-23 MED ORDER — NICOTINE 14 MG/24HR TD PT24
14.0000 mg | MEDICATED_PATCH | Freq: Every day | TRANSDERMAL | Status: DC
  Administered 2018-05-23 – 2018-05-24 (×2): 14 mg via TRANSDERMAL
  Filled 2018-05-23 (×2): qty 1

## 2018-05-23 MED ORDER — ACETAMINOPHEN 650 MG RE SUPP: 650.0000 mg | Freq: Four times a day (QID) | RECTAL | Status: DC | PRN

## 2018-05-23 MED ORDER — ALBUTEROL SULFATE (2.5 MG/3ML) 0.083% IN NEBU
2.5000 mg | INHALATION_SOLUTION | RESPIRATORY_TRACT | Status: DC | PRN
  Administered 2018-05-24: 2.5 mg via RESPIRATORY_TRACT

## 2018-05-23 MED ORDER — METHYLPREDNISOLONE SODIUM SUCC 40 MG IJ SOLR
40.0000 mg | Freq: Two times a day (BID) | INTRAMUSCULAR | Status: DC
  Administered 2018-05-23 – 2018-05-24 (×4): 40 mg via INTRAVENOUS
  Filled 2018-05-23 (×4): qty 1

## 2018-05-23 MED ORDER — MAGNESIUM SULFATE 2 GM/50ML IV SOLN
2.0000 g | Freq: Once | INTRAVENOUS | Status: AC
  Administered 2018-05-23: 2 g via INTRAVENOUS
  Filled 2018-05-23: qty 50

## 2018-05-23 MED ORDER — DM-GUAIFENESIN ER 30-600 MG PO TB12
1.0000 | ORAL_TABLET | Freq: Two times a day (BID) | ORAL | Status: DC
  Administered 2018-05-23 – 2018-05-24 (×3): 1 via ORAL
  Filled 2018-05-23 (×3): qty 1

## 2018-05-23 NOTE — ED Notes (Signed)
Report given to [place].

## 2018-05-23 NOTE — ED Notes (Signed)
Verified with pharmacy about patient receiving levofloxacin with an allergy to levocetriizine. Pharmacy verified that this is fine. No contraindications.

## 2018-05-23 NOTE — ED Notes (Signed)
Patient c/o of left arm pain/pressure during transport to 6E. Paused antibiotics. Fred,RN made aware on 6E. Patients pain relieved when we arrived to [place]. Restarted antibiotic (levoflacin) with [name] [place].

## 2018-05-23 NOTE — ED Notes (Signed)
ED TO INPATIENT HANDOFF REPORT  Name/Age/Gender Tony Sullivan 57 y.o. male  Code Status Code Status History    Date Active Date Inactive Code Status Order ID Comments User Context   08/29/2017 0417 08/30/2017 1512 Full Code [id number]  Etta Quill, DO ED   03/29/2017 0250 03/30/2017 1545 Full Code [id number]  Etta Quill, DO ED   01/10/2014 0814 01/11/2014 1426 Full Code [id number]  Hester Mates, MD Inpatient      Home/SNF/Other Home  Chief Complaint shortness of breath  Level of Care/Admitting Diagnosis ED Disposition    ED Disposition Condition [facility] Area: [facility] [[id number]]  Level of Care: Med-Surg [16]  Diagnosis: COPD exacerbation [facility]) [[id number]]  Admitting Physician: Debbe Odea [[id number]]  Attending Physician: Debbe Odea [[id number]]  PT Class (Do Not Modify): Observation [104]  PT Acc Code (Do Not Modify): Observation [10022]       Medical History Past Medical History:  Diagnosis Date  . Asthma   . COPD (chronic obstructive pulmonary disease) ([place]) 2004   diagnosed in 2004, no PFT's to date.  Started on home O2 12/2013, after found to be desatting at PCP's office, and referred to pulmonology.  . High blood pressure   . Seasonal allergies     Allergies Allergies  Allergen Reactions  . Penicillins Anaphylaxis    Has patient had a PCN reaction causing immediate rash, facial/tongue/throat swelling, SOB or lightheadedness with hypotension: Yes Has patient had a PCN reaction causing severe rash involving mucus membranes or skin necrosis: No Has patient had a PCN reaction that required hospitalization No Has patient had a PCN reaction occurring within the last 10 years: No If all of the above answers are "NO", then may proceed with Cephalosporin use.   . Sulfa Antibiotics Swelling    Swelling of hands and throat   . Famotidine Other (See Comments)    Bloating of stomach, loss of appetite  . Levocetirizine Other (See  Comments)    Muscle cramps    IV Location/Drains/Wounds Patient Lines/Drains/Airways Status   Active Line/Drains/Airways    Name:   Placement date:   Placement time:   Site:   Days:   Peripheral IV 05/23/18 Left Hand   05/23/18    -    Hand   less than 1          Labs/Imaging Results for orders placed or performed during the hospital encounter of 05/23/18 (from the past 48 hour(s))  CBC with Differential/Platelet     Status: Abnormal   Collection Time: 05/23/18  5:16 AM  Result Value Ref Range   WBC 18.8 (H) 4.0 - 10.5 K/uL   RBC 5.46 4.22 - 5.81 MIL/uL   Hemoglobin 18.2 (H) 13.0 - 17.0 g/dL   HCT 54.3 (H) 39.0 - 52.0 %   MCV 99.5 78.0 - 100.0 fL   MCH 33.3 26.0 - 34.0 pg   MCHC 33.5 30.0 - 36.0 g/dL   RDW 16.0 (H) 11.5 - 15.5 %   Platelets 243 150 - 400 K/uL   Neutrophils Relative % 56 %   Lymphocytes Relative 34 %   Monocytes Relative 9 %   Eosinophils Relative 1 %   Basophils Relative 0 %   Neutro Abs 10.5 (H) 1.7 - 7.7 K/uL   Lymphs Abs 6.4 (H) 0.7 - 4.0 K/uL   Monocytes Absolute 1.7 (H) 0.1 - 1.0 K/uL   Eosinophils Absolute 0.2 0.0 - 0.7 K/uL  Basophils Absolute 0.0 0.0 - 0.1 K/uL   RBC Morphology POLYCHROMASIA PRESENT     Comment: Performed at [facility], [place] [address] [place], [place] [address]  Basic metabolic panel     Status: Abnormal   Collection Time: 05/23/18  5:16 AM  Result Value Ref Range   Sodium 143 135 - 145 mmol/L   Potassium 3.7 3.5 - 5.1 mmol/L   Chloride 106 98 - 111 mmol/L   CO2 26 22 - 32 mmol/L   Glucose, Bld 95 70 - 99 mg/dL   BUN 13 6 - 20 mg/dL   Creatinine, Ser 0.87 0.61 - 1.24 mg/dL   Calcium 8.8 (L) 8.9 - 10.3 mg/dL   GFR calc non Af Amer >60 >60 mL/min   GFR calc Af Amer >60 >60 mL/min    Comment: (NOTE) The eGFR has been calculated using the CKD EPI equation. This calculation has not been validated in all clinical situations. eGFR's persistently <60 mL/min signify possible Chronic Kidney Disease.     Anion gap 11 5 - 15    Comment: Performed at [facility], [place] [address] [place], [place] [address]  I-stat troponin, ED     Status: None   Collection Time: 05/23/18  5:20 AM  Result Value Ref Range   Troponin i, poc 0.00 0.00 - 0.08 ng/mL   Comment 3            Comment: Due to the release kinetics of cTnI, a negative result within the first hours of the onset of symptoms does not rule out myocardial infarction with certainty. If myocardial infarction is still suspected, repeat the test at appropriate intervals.    Dg Chest Port 1 View  Result Date: 05/23/2018 CLINICAL DATA:  Shortness of breath and chest pain. History of COPD, asthma, current smoker. EXAM: PORTABLE CHEST 1 VIEW COMPARISON:  05/12/2018 FINDINGS: Heart size and pulmonary vascularity are normal. Emphysematous changes in the lungs. Central interstitial pattern consistent with chronic bronchitis. No airspace disease or consolidation in the lungs. No blunting of costophrenic angles. No pneumothorax. Mediastinal contours appear intact. IMPRESSION: Emphysematous and chronic bronchitic changes in the lungs. No evidence of active pulmonary disease. Electronically Signed   By: Lucienne Capers M.D.   On: 05/23/2018 06:13    Pending Labs Unresulted Labs (From admission, onward)   None      Vitals/Pain Today's Vitals   05/23/18 0703 05/23/18 0713 05/23/18 0730 05/23/18 0737  BP: 120/77  100/73 135/84  Pulse: 99 95 95 (!) 103  Resp: '20 17 16 '$ (!) 24  Temp: 98 F (36.7 C)     TempSrc: Oral     SpO2: 95% 97% 96% 98%    Isolation Precautions No active isolations  Medications Medications  albuterol (PROVENTIL,VENTOLIN) solution continuous neb (10 mg/hr Nebulization New Bag/Given 05/23/18 0656)  magnesium sulfate IVPB 2 g 50 mL (2 g Intravenous New Bag/Given 05/23/18 0712)  methylPREDNISolone sodium succinate (SOLU-MEDROL) 125 mg/2 mL injection 125 mg (125 mg Intravenous Given 05/23/18 0515)     Mobility walks

## 2018-05-23 NOTE — ED Notes (Signed)
Attempted to give report to [place], RN [phone]) X2. Line is busy. Will attempt again in 5 min with Network engineer.

## 2018-05-23 NOTE — ED Notes (Signed)
Bed: TZ00 Expected date:  Expected time:  Means of arrival:  Comments: 13 M Combative/ SOB

## 2018-05-23 NOTE — ED Notes (Signed)
Patient receiving continuous neb treatment. Will wait till treatment is finished before transporting patient upstairs.

## 2018-05-23 NOTE — ED Notes (Signed)
Attempted to call [phone]- [phone]. No answer. Will try back.

## 2018-05-23 NOTE — ED Notes (Addendum)
No sign and held orders at this time. Patient resting and able to speak in complete sentences without any problems. Patient receiving continuous neb treatment and mag IVPB. Diet is in.

## 2018-05-23 NOTE — H&P (Addendum)
History and Physical    Tony Sullivan QVZ:[id number] DOB: 11/12/1960 DOA: 05/23/2018    PCP: [name], FNP  Patient coming from: home  Chief Complaint: shortness of breath.  HPI: Tony Sullivan is a 57 y.o. male with medical history of advanced COPD on home O2, ongoing smoking, HTN, anxiety who presented to the ED on 7/16 with dyspnea, was treated for a COPD flare with Albuterol Nebs and Solumedrol and discharged from the ED on a 5 day Prednisone taper.  He returns today with another flare. He states he woke up in the middle of the night with shortness of breath. It did not improve with a Neb treatment. He has been taking Prednisone 10 mg BID (prescribed as 10 mg daily but he takes an extra if he feels bad).  He did grow out strep pneumoniae from a sputum culture on 7/15 and per note in Epic, he was prescribed Levaquin by Tony Pitter, NP for 5 days.   ED Course: 93% on 4 L [place], given Solumedrol as well.   Review of Systems:  All other systems reviewed and apart from HPI, are negative.  Past Medical History:  Diagnosis Date  . Asthma   . COPD (chronic obstructive pulmonary disease) ([place]) 2004   diagnosed in 2004, no PFT's to date.  Started on home O2 12/2013, after found to be desatting at PCP's office, and referred to pulmonology.  . High blood pressure   . Seasonal allergies     Past Surgical History:  Procedure Laterality Date  . APPENDECTOMY  1980  . COLONOSCOPY WITH PROPOFOL N/A 12/10/2016   Procedure: COLONOSCOPY WITH PROPOFOL;  Surgeon: Tony Shipper, MD;  Location: [facility] ENDOSCOPY;  Service: Endoscopy;  Laterality: N/A;  . HIP SURGERY Right   . SHOULDER ARTHROSCOPY Right     Social History:   reports that he has been smoking cigarettes.  He has a 18.00 pack-year smoking history. He has never used smokeless tobacco. He reports that he drinks about 8.4 - 12.6 oz of alcohol per week. He reports that he does not use drugs.  Allergies  Allergen Reactions  .  Penicillins Anaphylaxis    Has patient had a PCN reaction causing immediate rash, facial/tongue/throat swelling, SOB or lightheadedness with hypotension: Yes Has patient had a PCN reaction causing severe rash involving mucus membranes or skin necrosis: No Has patient had a PCN reaction that required hospitalization No Has patient had a PCN reaction occurring within the last 10 years: No If all of the above answers are "NO", then may proceed with Cephalosporin use.   . Sulfa Antibiotics Swelling    Swelling of hands and throat   . Famotidine Other (See Comments)    Bloating of stomach, loss of appetite  . Levocetirizine Other (See Comments)    Muscle cramps    Family History  Problem Relation Age of Onset  . Emphysema Mother   . Allergies Mother        "everyone in family"  . Asthma Mother        "everyone in family"  . Heart disease Mother   . Clotting disorder Mother   . Cancer Mother   . Emphysema Father   . Allergies Father   . Allergies Son   . Heart disease Maternal Aunt      Prior to Admission medications   Medication Sig Start Date End Date Taking? Authorizing Provider  Aclidinium Bromide (TUDORZA PRESSAIR) 400 MCG/ACT AEPB Inhale 1 puff into the  lungs 2 (two) times daily. 11/03/17  Yes [name], MD  albuterol (PROAIR HFA) 108 (90 Base) MCG/ACT inhaler INHALE TWO PUFFS UP TO EVERY 4 HOURS IF NEEDED FOR SHORT OF BREATH 05/04/18  Yes [name], MD  albuterol (PROVENTIL) (2.5 MG/3ML) 0.083% nebulizer solution Take 3 mLs (2.5 mg total) by nebulization every 4 (four) hours as needed for wheezing or shortness of breath. 04/28/18  Yes [name], NP  amLODipine (NORVASC) 10 MG tablet Take 10 mg by mouth daily.    Yes [provider]  benztropine (COGENTIN) 0.5 MG tablet Take 0.5 mg by mouth at bedtime.    Yes [provider]  budesonide-formoterol (SYMBICORT) 160-4.5 MCG/ACT inhaler INHALE TWO PUFFS INTO THE LUNGS TWO (TWO) TIMES DAILY. 04/28/18   Yes [name], NP  cetirizine (ZYRTEC) 10 MG tablet Take 10 mg by mouth daily.   Yes [provider]  docusate sodium (COLACE) 100 MG capsule Take 100 mg by mouth daily.   Yes [provider]  famotidine (PEPCID) 20 MG tablet One at bedtime Patient taking differently: Take 20 mg by mouth at bedtime.  04/28/18  Yes [name], NP  fluticasone (FLONASE) 50 MCG/ACT nasal spray Place 1 spray into both nostrils daily as needed for allergies or rhinitis.    Yes [provider]  losartan (COZAAR) 100 MG tablet Take 100 mg by mouth daily.   Yes [provider]  loxapine (LOXITANE) 10 MG capsule Take 10 mg by mouth at bedtime.    Yes [provider]  Multiple Vitamin (MULTIVITAMIN) capsule Take 1 capsule by mouth daily.   Yes [provider]  OXYGEN Inhale 2 L into the lungs as directed. 2-3 liters with sleep and exertion as needed for low oxygen   Yes [provider]  pantoprazole (PROTONIX) 40 MG tablet Take 40 mg by mouth daily.   Yes [provider]  predniSONE (DELTASONE) 10 MG tablet TAKE TWO TABLETS DAILY UNTIL BETTER THEN 1 DAILY Patient taking differently: Take 10-40 mg by mouth See admin instructions. Takes between one and four tablets a day as directed by the doctor for breathing difficulties. 04/28/18  Yes [name], NP  Probiotic Product (TRUBIOTICS PO) Take 1 capsule by mouth daily.   Yes [provider]  Respiratory Therapy Supplies (FLUTTER) DEVI Use as directed 04/01/16  Yes [name], MD  risperiDONE (RISPERDAL) 2 MG tablet Take 2 mg by mouth at bedtime.   Yes [provider]  traMADol (ULTRAM) 50 MG tablet Take 50-100 mg by mouth 3 (three) times daily as needed for moderate pain.    Yes [provider]    Physical Exam: Wt Readings from Last 3 Encounters:  05/21/18 75.3 kg (166 lb)  05/12/18 77.1 kg (170 lb)  04/28/18 75.8 kg (167 lb)   Vitals:   05/23/18  0800 05/23/18 0816 05/23/18 0924 05/23/18 1258  BP: 106/78 117/86 (!) 121/93 (!) 149/73  Pulse: 92 93 95 94  Resp: 19 (!) 22 18 18   Temp:    98.1 F (36.7 C)  TempSrc:    Oral  SpO2: 96% 95% 93% 93%      Constitutional: NAD, calm, comfortable Eyes: PERTLA, lids and conjunctivae normal ENMT: Mucous membranes are moist. Posterior pharynx clear of any exudate or lesions. Normal dentition.  Neck: normal, supple, no masses, no thyromegaly Respiratory: clear to auscultation bilaterally, no wheezing, no crackles. Normal respiratory effort. No accessory muscle use.  Cardiovascular:  S1 & S2 heard, regular rate and rhythm, no murmurs / rubs / gallops. No extremity edema. 2+ pedal pulses. No carotid bruits.  Abdomen: No distension, no tenderness, no masses palpated. No hepatosplenomegaly. Bowel sounds normal.  Musculoskeletal: no clubbing / cyanosis. No joint deformity upper and lower extremities. Good ROM, no contractures. Normal muscle tone.  Skin: no rashes, lesions, ulcers. No induration Neurologic: CN 2-12 grossly intact. Sensation intact, DTR normal. Strength 5/5 in all 4 limbs.  Psychiatric: Normal judgment and insight. Alert and oriented x 3. Normal mood.     Labs on Admission: I have personally reviewed following labs and imaging studies  CBC: Recent Labs  Lab 05/23/18 0516  WBC 18.8*  NEUTROABS 10.5*  HGB 18.2*  HCT 54.3*  MCV 99.5  PLT 237   Basic Metabolic Panel: Recent Labs  Lab 05/23/18 0516  NA 143  K 3.7  CL 106  CO2 26  GLUCOSE 95  BUN 13  CREATININE 0.87  CALCIUM 8.8*   GFR: Estimated Creatinine Clearance: 100.2 mL/min (by C-G formula based on SCr of 0.87 mg/dL). Liver Function Tests: No results for input(s): AST, ALT, ALKPHOS, BILITOT, PROT, ALBUMIN in the last 168 hours. No results for input(s): LIPASE, AMYLASE in the last 168 hours. No results for input(s): AMMONIA in the last 168 hours. Coagulation Profile: No results for input(s): INR, PROTIME in  the last 168 hours. Cardiac Enzymes: No results for input(s): CKTOTAL, CKMB, CKMBINDEX, TROPONINI in the last 168 hours. BNP (last 3 results) No results for input(s): PROBNP in the last 8760 hours. HbA1C: No results for input(s): HGBA1C in the last 72 hours. CBG: No results for input(s): GLUCAP in the last 168 hours. Lipid Profile: No results for input(s): CHOL, HDL, LDLCALC, TRIG, CHOLHDL, LDLDIRECT in the last 72 hours. Thyroid Function Tests: No results for input(s): TSH, T4TOTAL, FREET4, T3FREE, THYROIDAB in the last 72 hours. Anemia Panel: No results for input(s): VITAMINB12, FOLATE, FERRITIN, TIBC, IRON, RETICCTPCT in the last 72 hours. Urine analysis:    Component Value Date/Time   COLORURINE AMBER (A) 06/12/2015 1021   APPEARANCEUR CLEAR 06/12/2015 1021   LABSPEC 1.018 06/12/2015 1021   PHURINE 5.5 06/12/2015 1021   GLUCOSEU NEGATIVE 06/12/2015 1021   HGBUR NEGATIVE 06/12/2015 1021   BILIRUBINUR NEGATIVE 06/12/2015 1021   KETONESUR NEGATIVE 06/12/2015 1021   PROTEINUR NEGATIVE 06/12/2015 1021   UROBILINOGEN 1.0 06/12/2015 1021   NITRITE NEGATIVE 06/12/2015 1021   LEUKOCYTESUR NEGATIVE 06/12/2015 1021   Sepsis Labs: @LABRCNTIP (procalcitonin:4,lacticidven:4) )No results found for this or any previous visit (from the past 240 hour(s)).   Radiological Exams on Admission: Dg Chest Port 1 View  Result Date: 05/23/2018 CLINICAL DATA:  Shortness of breath and chest pain. History of COPD, asthma, current smoker. EXAM: PORTABLE CHEST 1 VIEW COMPARISON:  05/12/2018 FINDINGS: Heart size and pulmonary vascularity are normal. Emphysematous changes in the lungs. Central interstitial pattern consistent with chronic bronchitis. No airspace disease or consolidation in the lungs. No blunting of costophrenic angles. No pneumothorax. Mediastinal contours appear intact. IMPRESSION: Emphysematous and chronic bronchitic changes in the lungs. No evidence of active pulmonary disease.  Electronically Signed   By: Lucienne Capers M.D.   On: 05/23/2018 06:13    EKG: Independently reviewed.NSR with 72 bpm  Assessment/Plan Principal Problem:   COPD gold stage 2 with exacerbation/  Acute on Chronic respiratory failure with hypoxia - follows with Dr [name] - cont Solumedrol and Neb treatments- he is feeling much better - start slow IVF, add Flutter  valve and Mucinex - start Levaquin due to recent Strep Pneumo sputum culture - cont O2 to keep pulse ox 88-92%  Active Problems:   Smoker - start Nicotine patches - he states he has tried Chantix in the past and it did not work    Essential hypertension - initially noted to have hypotension in ED which has since resolved -  cont Norvasc and Cozaar with holding parameters    ETOH abuse  - CIWA scale ordered  Depression and anxiety - cont Loxitane, Risperdal and Cogentin    DVT prophylaxis: Lovenox  Code Status: Full code Family Communication:   Disposition Plan: admit to med/surg  Consults called: none  Admission status: inpatient     Debbe Odea MD [facility] Pager: [email or web address] Password [email or web address] 7PM-7AM, please contact night-coverage   05/23/2018, 4:09 PM

## 2018-05-23 NOTE — ED Provider Notes (Addendum)
[facility] DEPT Provider Note  CSN: [id number] Arrival date & time: 05/23/18 0445  Chief Complaint(s) Shortness of Breath and Chest Pain  HPI Tony Sullivan is a 57 y.o. male with a history of COPD who presents to the emergency department with severe shortness of breath and chest tightness similar to prior COPD exacerbations.  Patient has tried taking breathing treatments without improvement.  Denies any recent fevers or infections.  Denies any coughing or congestion.  Chest pain is described as tightness and is nonradiating, nonexertional.  No headache, nausea, vomiting.  No abdominal pain.  Still smoking cigarettes.  Denies any illicit drug use.  Patient called EMS who provided the patient with 2 breathing treatments.  He is endorsing some mild improvement.  No other alleviating or aggravating factors.  HPI  Past Medical History Past Medical History:  Diagnosis Date  . Asthma   . COPD (chronic obstructive pulmonary disease) ([place]) 2004   diagnosed in 2004, no PFT's to date.  Started on home O2 12/2013, after found to be desatting at PCP's office, and referred to pulmonology.  . High blood pressure   . Seasonal allergies    Patient Active Problem List   Diagnosis Date Noted  . Poor dentition 11/04/2017  . Acute on chronic respiratory failure with hypoxia ([place]) 08/29/2017  . COPD exacerbation ([place]) 08/29/2017  . ETOH abuse 03/30/2017  . Cigarette smoker 03/30/2017  . Acute respiratory failure with hypoxia and hypercapnia ([place]) 03/29/2017  . Special screening for malignant neoplasms, colon   . Benign neoplasm of ascending colon   . Benign neoplasm of descending colon   . Hoarseness 08/22/2016  . GERD (gastroesophageal reflux disease) 08/22/2016  . Atypical chest pain 12/05/2015  . Essential hypertension 06/15/2014  . Pectoralis muscle strain 01/11/2014  . COPD with acute exacerbation ([place]) 01/10/2014  . COPD  GOLD II 01/08/2014  . Smoker  01/08/2014  . Cough 01/08/2014  . Chronic respiratory failure with hypoxia ([place]) 01/08/2014   Home Medication(s) Prior to Admission medications   Medication Sig Start Date End Date Taking? Authorizing Provider  Aclidinium Bromide (TUDORZA PRESSAIR) 400 MCG/ACT AEPB Inhale 1 puff into the lungs 2 (two) times daily. 11/03/17   [name], MD  albuterol (PROAIR HFA) 108 (90 Base) MCG/ACT inhaler INHALE TWO PUFFS UP TO EVERY 4 HOURS IF NEEDED FOR SHORT OF BREATH Patient taking differently: Inhale 1 puff into the lungs every 6 (six) hours as needed for wheezing or shortness of breath.  05/04/18   [name], MD  albuterol (PROVENTIL) (2.5 MG/3ML) 0.083% nebulizer solution Take 3 mLs (2.5 mg total) by nebulization every 4 (four) hours as needed for wheezing or shortness of breath. 04/28/18   [name], NP  amLODipine (NORVASC) 10 MG tablet Take 10 mg by mouth daily.     [provider]  benztropine (COGENTIN) 0.5 MG tablet Take 0.5 mg by mouth at bedtime.     [provider]  budesonide-formoterol (SYMBICORT) 160-4.5 MCG/ACT inhaler INHALE TWO PUFFS INTO THE LUNGS TWO (TWO) TIMES DAILY. Patient taking differently: Inhale 2 puffs into the lungs 2 (two) times daily.  04/28/18   [name], NP  cetirizine (ZYRTEC) 10 MG tablet Take 10 mg by mouth daily.    [provider]  docusate sodium (COLACE) 100 MG capsule Take 100 mg by mouth daily.    [provider]  famotidine (PEPCID) 20 MG tablet One at bedtime Patient taking differently: Take 20 mg by  mouth at bedtime.  04/28/18   [name], NP  fluticasone (FLONASE) 50 MCG/ACT nasal spray Place 1 spray into both nostrils daily as needed for allergies or rhinitis.     [provider]  losartan (COZAAR) 100 MG tablet Take 100 mg by mouth daily.    [provider]  loxapine (LOXITANE) 10 MG capsule Take 10 mg by mouth at bedtime.     [provider]  MAGNESIUM CITRATE PO  Take 1 capsule by mouth every 21 ( twenty-one) days.    [provider]  Multiple Vitamin (MULTIVITAMIN) capsule Take 1 capsule by mouth daily.    [provider]  OXYGEN Inhale 2 L into the lungs as directed. 2-3 liters with sleep and exertion as needed for low oxygen    [provider]  pantoprazole (PROTONIX) 40 MG tablet Take 40 mg by mouth daily.    [provider]  predniSONE (DELTASONE) 10 MG tablet TAKE TWO TABLETS DAILY UNTIL BETTER THEN 1 DAILY Patient taking differently: Take 10-40 mg by mouth See admin instructions. Takes between one and four tablets a day as directed by the doctor for breathing difficulties. 04/28/18   [name], NP  Probiotic Product (TRUBIOTICS PO) Take 1 capsule by mouth daily.    [provider]  Respiratory Therapy Supplies (FLUTTER) DEVI Use as directed 04/01/16   [name], MD  risperiDONE (RISPERDAL) 2 MG tablet Take 2 mg by mouth at bedtime.    [provider]  traMADol (ULTRAM) 50 MG tablet Take 50-100 mg by mouth 3 (three) times daily as needed for moderate pain.     [provider]                                                                                                                                    Past Surgical History Past Surgical History:  Procedure Laterality Date  . APPENDECTOMY  1980  . COLONOSCOPY WITH PROPOFOL N/A 12/10/2016   Procedure: COLONOSCOPY WITH PROPOFOL;  Surgeon: [name], MD;  Location: [facility] ENDOSCOPY;  Service: Endoscopy;  Laterality: N/A;  . HIP SURGERY Right   . SHOULDER ARTHROSCOPY Right    Family History Family History  Problem Relation Age of Onset  . Emphysema Mother   . Allergies Mother        "everyone in family"  . Asthma Mother        "everyone in family"  . Heart disease Mother   . Clotting disorder Mother   . Cancer Mother   . Emphysema Father   . Allergies Father   . Allergies Son   . Heart disease Maternal Aunt      Social History Social History   Tobacco Use  . Smoking status: Current Some Day Smoker    Packs/day: 0.50    Years: 36.00    Pack years: 18.00    Types: Cigarettes  .  Smokeless tobacco: Never Used  Substance Use Topics  . Alcohol use: Yes    Alcohol/week: 8.4 - 12.6 oz    Types: 14 - 21 Standard drinks or equivalent per week    Comment: 2-3 beers/day, with occasional "binges".  No history of withdrawal  . Drug use: No   Allergies Penicillins; Sulfa antibiotics; Famotidine; and Levocetirizine  Review of Systems Review of Systems All other systems are reviewed and are negative for acute change except as noted in the HPI  Physical Exam Vital Signs  I have reviewed the triage vital signs BP 120/77   Pulse 99   Temp 98 F (36.7 C) (Oral)   Resp 20   SpO2 95%   Physical Exam  Constitutional: He is oriented to person, place, and time. He appears well-developed and well-nourished. No distress.  HENT:  Head: Normocephalic and atraumatic.  Nose: Nose normal.  Eyes: Pupils are equal, round, and reactive to light. Conjunctivae and EOM are normal. Right eye exhibits no discharge. Left eye exhibits no discharge. No scleral icterus.  Neck: Normal range of motion. Neck supple.  Cardiovascular: Normal rate and regular rhythm. Exam reveals no gallop and no friction rub.  No murmur heard. Pulmonary/Chest: Accessory muscle usage present. No stridor. Tachypnea noted. No respiratory distress. He has wheezes. He has no rales.  Poor air movement.  Faint inspiratory and expiratory wheezes.  Abdominal: Soft. He exhibits no distension. There is no tenderness.  Musculoskeletal: He exhibits no edema or tenderness.  Neurological: He is alert and oriented to person, place, and time.  Skin: Skin is warm and dry. No rash noted. He is not diaphoretic. No erythema.  Psychiatric: He has a normal mood and affect.  Vitals reviewed.   ED Results and Treatments Labs (all labs ordered are listed,  but only abnormal results are displayed) Labs Reviewed  CBC WITH DIFFERENTIAL/PLATELET - Abnormal; Notable for the following components:      Result Value   WBC 18.8 (*)    Hemoglobin 18.2 (*)    HCT 54.3 (*)    RDW 16.0 (*)    Neutro Abs 10.5 (*)    Lymphs Abs 6.4 (*)    Monocytes Absolute 1.7 (*)    All other components within normal limits  BASIC METABOLIC PANEL - Abnormal; Notable for the following components:   Calcium 8.8 (*)    All other components within normal limits  I-STAT TROPONIN, ED                                                                                                                         EKG  EKG Interpretation  Date/Time:  Saturday May 23 2018 06:54:06 EDT Ventricular Rate:  93 PR Interval:    QRS Duration: 75 QT Interval:  355 QTC Calculation: 442 R Axis:   -81 Text Interpretation:  Sinus rhythm Multiple premature complexes, vent & supraven Biatrial enlargement Left anterior fascicular block Anteroseptal infarct, age indeterminate No significant change since last  tracing Confirmed by Addison Lank [id number]) on 05/23/2018 7:09:22 AM      Radiology Dg Chest Port 1 View  Result Date: 05/23/2018 CLINICAL DATA:  Shortness of breath and chest pain. History of COPD, asthma, current smoker. EXAM: PORTABLE CHEST 1 VIEW COMPARISON:  05/12/2018 FINDINGS: Heart size and pulmonary vascularity are normal. Emphysematous changes in the lungs. Central interstitial pattern consistent with chronic bronchitis. No airspace disease or consolidation in the lungs. No blunting of costophrenic angles. No pneumothorax. Mediastinal contours appear intact. IMPRESSION: Emphysematous and chronic bronchitic changes in the lungs. No evidence of active pulmonary disease. Electronically Signed   By: Lucienne Capers M.D.   On: 05/23/2018 06:13   Pertinent labs & imaging results that were available during my care of the patient were reviewed by me and considered in my medical decision  making (see chart for details).  Medications Ordered in ED Medications  albuterol (PROVENTIL,VENTOLIN) solution continuous neb (10 mg/hr Nebulization New Bag/Given 05/23/18 0656)  magnesium sulfate IVPB 2 g 50 mL (2 g Intravenous New Bag/Given 05/23/18 7893)  methylPREDNISolone sodium succinate (SOLU-MEDROL) 125 mg/2 mL injection 125 mg (125 mg Intravenous Given 05/23/18 0515)                                                                                                                                    Procedures Procedures CRITICAL CARE Performed by: Grayce Sessions Tienna Bienkowski Total critical care time: 40 minutes Critical care time was exclusive of separately billable procedures and treating other patients. Critical care was necessary to treat or prevent imminent or life-threatening deterioration. Critical care was time spent personally by me on the following activities: development of treatment plan with patient and/or surrogate as well as nursing, discussions with consultants, evaluation of patient's response to treatment, examination of patient, obtaining history from patient or surrogate, ordering and performing treatments and interventions, ordering and review of laboratory studies, ordering and review of radiographic studies, pulse oximetry and re-evaluation of patient's condition.   (including critical care time)  Medical Decision Making / ED Course I have reviewed the nursing notes for this encounter and the patient's prior records (if available in EHR or on provided paperwork).    Presentation consistent with COPD exacerbation.  Patient given continuous DuoNeb and Solu-Medrol.  EKG without acute ischemic changes or evidence of pericarditis.  Initial troponin negative.  Doubt cardiac etiology.  Patient had similar presentation last week and had a negative CTA for PE. Chest x-ray without evidence suggestive of pneumonia, pneumothorax, pneumomediastinum.  No abnormal contour of the  mediastinum to suggest dissection. No evidence of acute injuries.  Presentation not classic for aortic dissection or esophageal perforation.  On reassessment, patient's work of breathing is improved however he still feels short of breath with chest tightness.  Additional breathing treatment ordered.  We will also give magnesium.  Will discuss case with medicine for admission and continued management.  Pt noted to have +  sputum culture from 7/8 that grew out strep pneumo.  Started on Levaquin due to medicine allergies.  Final Clinical Impression(s) / ED Diagnoses Final diagnoses:  SOB (shortness of breath)  COPD exacerbation ([place])  Bronchitis     This chart was dictated using voice recognition software.  Despite best efforts to proofread,  errors can occur which can change the documentation meaning.     Fatima Blank, MD 05/23/18 [phone]

## 2018-05-23 NOTE — ED Triage Notes (Signed)
Pt presents to ED via EMS for shortness of breath and chest pain. Pt reports that he woke up feeling SOB. Pt combative with EMS, throwing things, and refusing most care. duoneb x2 given.

## 2018-05-23 NOTE — ED Notes (Addendum)
Debbe Odea, MD at bedside.

## 2018-05-23 NOTE — ED Notes (Signed)
Attempted to call [phone]. No answer.

## 2018-05-24 ENCOUNTER — Inpatient Hospital Stay (HOSPITAL_COMMUNITY): Payer: Medicaid Other

## 2018-05-24 ENCOUNTER — Other Ambulatory Visit: Payer: Self-pay

## 2018-05-24 DIAGNOSIS — F1010 Alcohol abuse, uncomplicated: Secondary | ICD-10-CM

## 2018-05-24 DIAGNOSIS — R1011 Right upper quadrant pain: Secondary | ICD-10-CM

## 2018-05-24 DIAGNOSIS — R109 Unspecified abdominal pain: Secondary | ICD-10-CM

## 2018-05-24 LAB — CBC
HCT: 47.5 % (ref 39.0–52.0)
Hemoglobin: 15.5 g/dL (ref 13.0–17.0)
MCH: 32.4 pg (ref 26.0–34.0)
MCHC: 32.6 g/dL (ref 30.0–36.0)
MCV: 99.4 fL (ref 78.0–100.0)
Platelets: 242 10*3/uL (ref 150–400)
RBC: 4.78 MIL/uL (ref 4.22–5.81)
RDW: 15.9 % — ABNORMAL HIGH (ref 11.5–15.5)
WBC: 19.6 10*3/uL — ABNORMAL HIGH (ref 4.0–10.5)

## 2018-05-24 LAB — BASIC METABOLIC PANEL
Anion gap: 7 (ref 5–15)
BUN: 14 mg/dL (ref 6–20)
CO2: 27 mmol/L (ref 22–32)
Calcium: 9 mg/dL (ref 8.9–10.3)
Chloride: 108 mmol/L (ref 98–111)
Creatinine, Ser: 0.84 mg/dL (ref 0.61–1.24)
GFR calc Af Amer: >60 mL/min (ref >60)
GFR calc non Af Amer: >60 mL/min (ref >60)
Glucose, Bld: 126 mg/dL — ABNORMAL HIGH (ref 70–99)
Potassium: 4.2 mmol/L (ref 3.5–5.1)
Sodium: 142 mmol/L (ref 135–145)

## 2018-05-24 LAB — HIV ANTIBODY (ROUTINE TESTING W REFLEX): HIV Screen 4th Generation wRfx: NONREACTIVE

## 2018-05-24 IMAGING — US US ABDOMEN COMPLETE
1 series · 14 of 25 positions shown · non-contrast
Comparison: CT of the abdomen and pelvis [DATE]

CLINICAL DATA: Abdominal pain.

EXAM:
ABDOMEN ULTRASOUND COMPLETE

[Series 1: us abdomen complete · 14 of 76 slices shown]
[im 1/76]
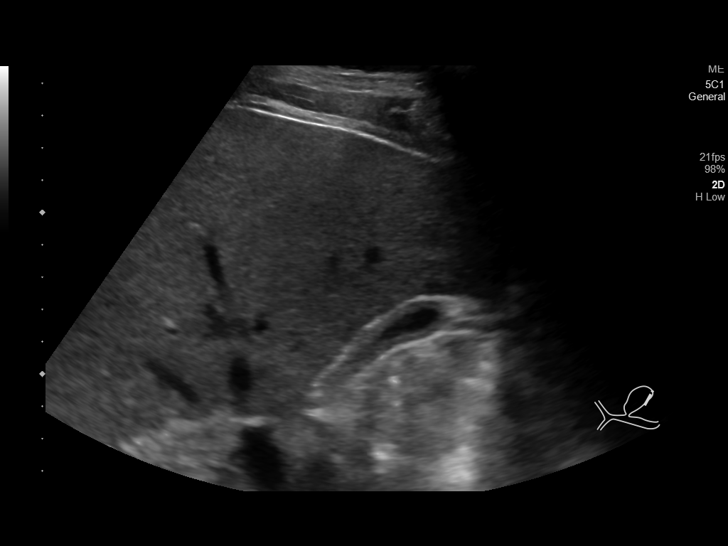
[im 7/76]
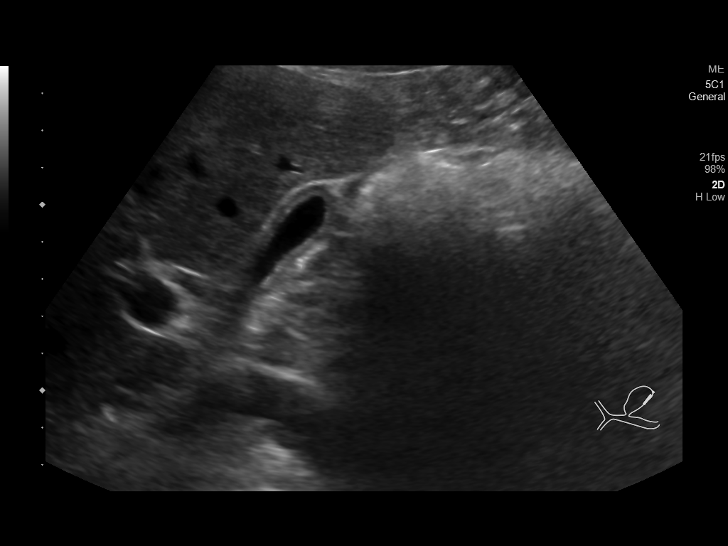
[im 13/76]
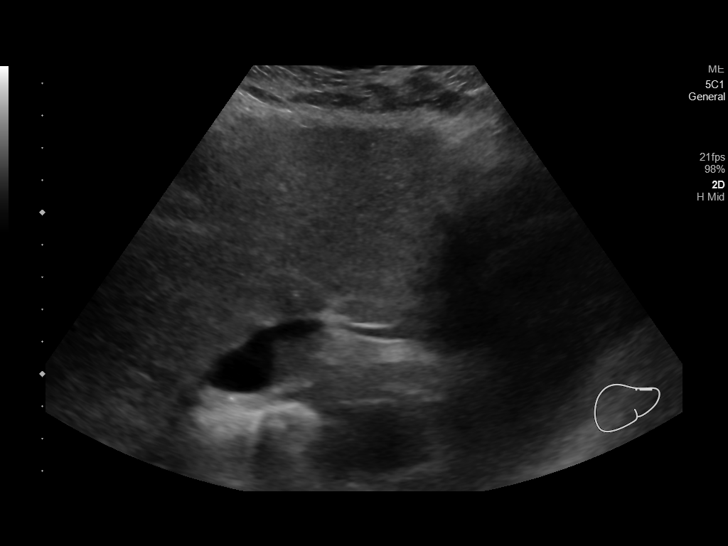
[im 19/76]
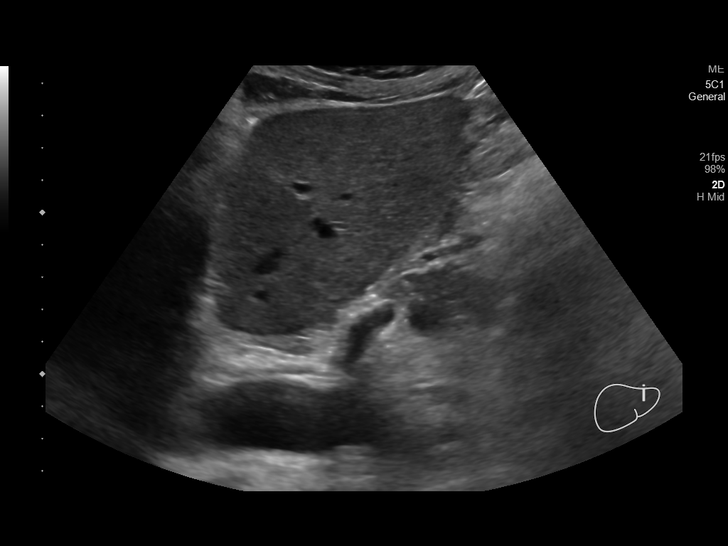
[im 26/76]
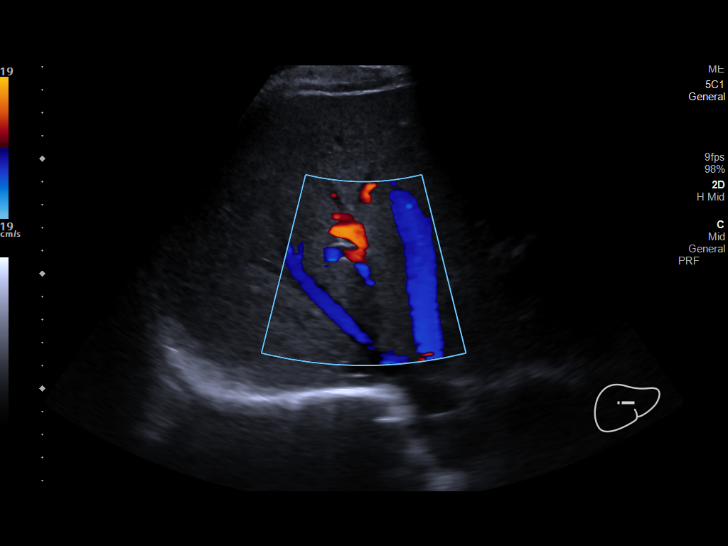
[im 29/76]
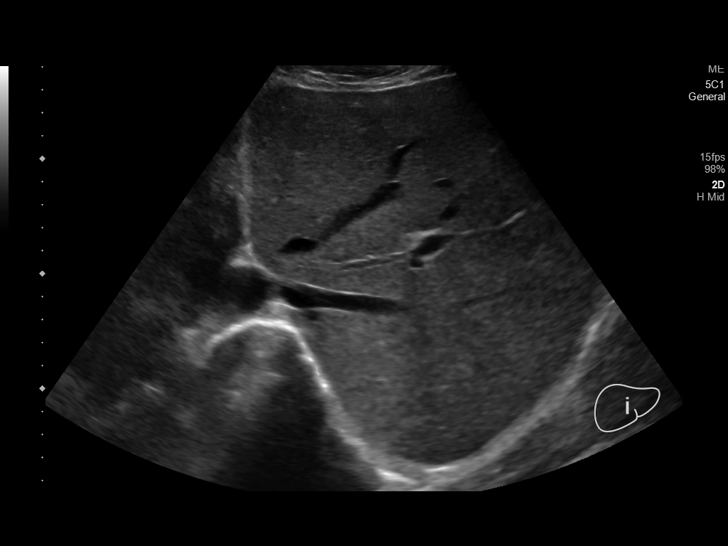
[im 35/76]
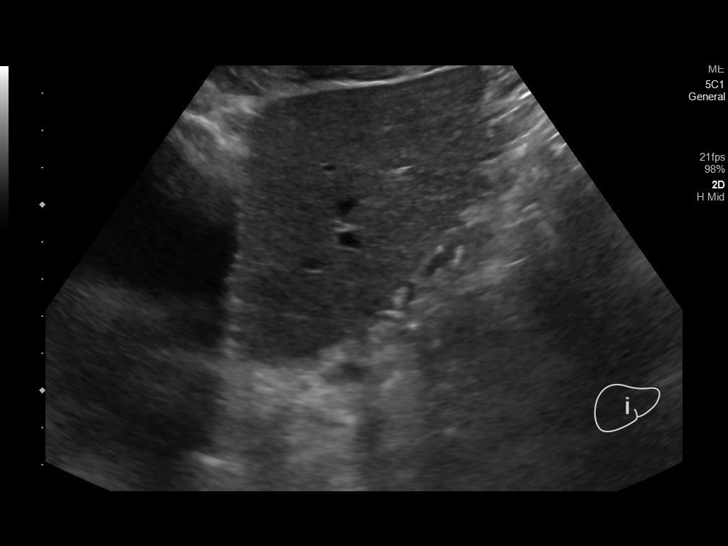
[im 41/76]
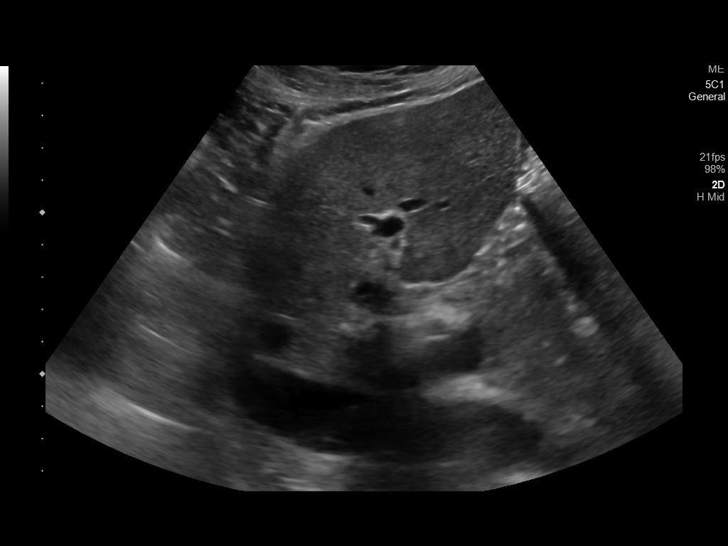
[im 47/76]
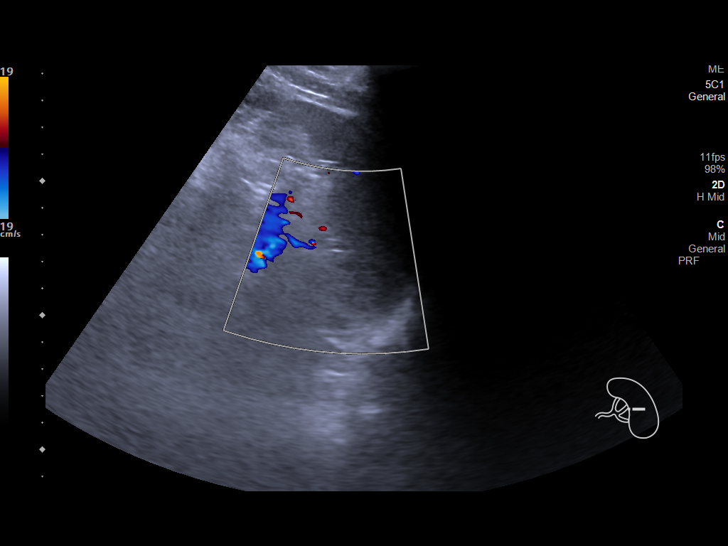
[im 51/76]
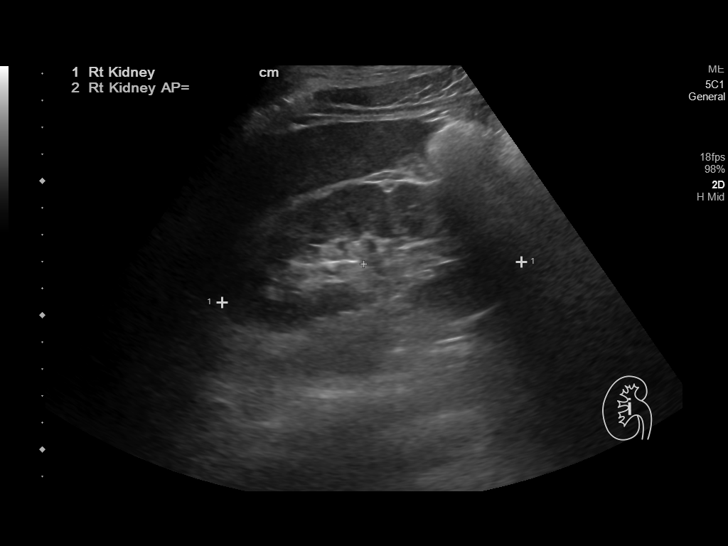
[im 57/76]
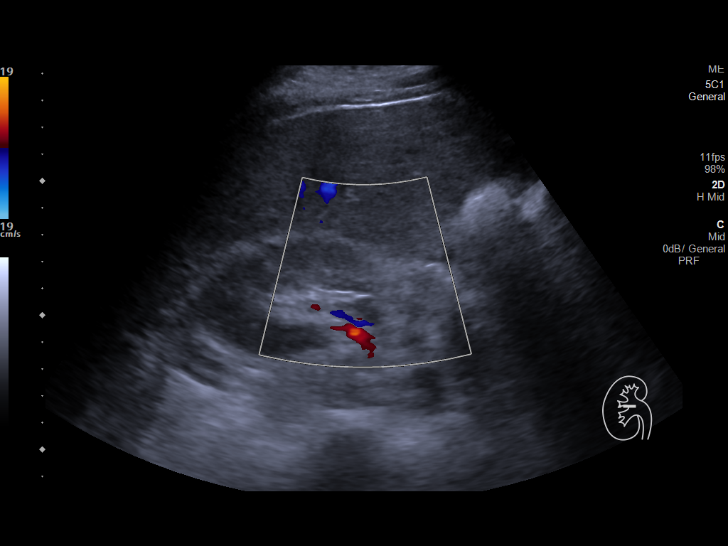
[im 63/76]
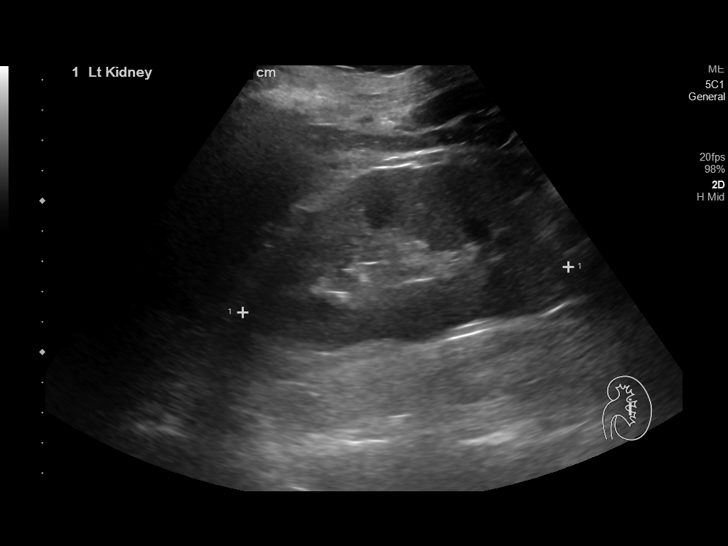
[im 69/76]
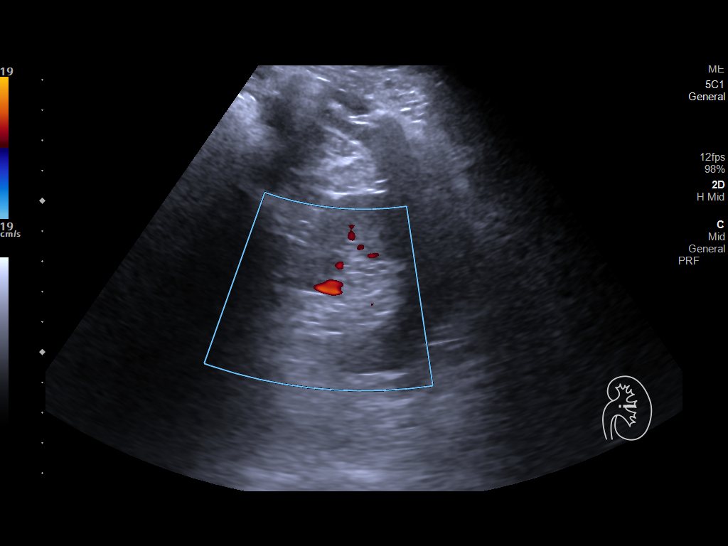
[im 76/76]
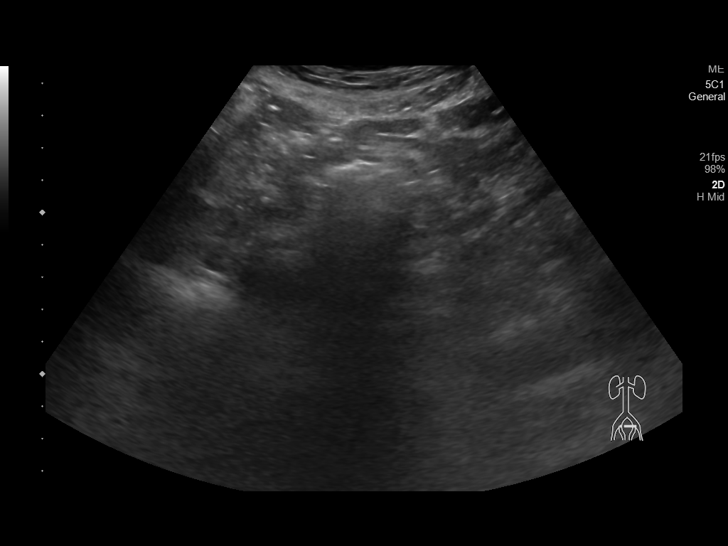

[14 of 25 positions shown; findings below may reference images not displayed]

FINDINGS: Gallbladder: No gallstones or wall thickening visualized. No
sonographic Murphy sign noted by sonographer. The gallbladder is
contracted. This likely reflects a non-fasting state.

Common bile duct: Diameter: 4 mm, within normal limits.

Liver: No focal lesion identified. Within normal limits in
parenchymal echogenicity. Portal vein is patent on color Doppler
imaging with normal direction of blood flow towards the liver.

IVC: No abnormality visualized.

Pancreas: Visualized portion unremarkable.

Spleen: Size and appearance within normal limits.

Right Kidney: Length: 11.3 cm, within normal limits. Echogenicity
within normal limits. No mass or hydronephrosis visualized.

Left Kidney: Length: 10.9 cm, within normal limits. Echogenicity
within normal limits. No mass or hydronephrosis visualized.

Abdominal aorta: No aneurysm visualized.

Other findings: None.
IMPRESSION: Negative abdominal ultrasound. No acute or focal lesion to explain
the patient's symptoms.

## 2018-05-24 MED ORDER — POLYETHYLENE GLYCOL 3350 17 G PO PACK: 17.0000 g | PACK | Freq: Every day | ORAL | Status: DC | PRN

## 2018-05-24 MED ORDER — LEVOFLOXACIN 500 MG PO TABS: 500.0000 mg | ORAL_TABLET | Freq: Every day | ORAL | 0 refills | Status: AC

## 2018-05-24 MED ORDER — NICOTINE 14 MG/24HR TD PT24: 14.0000 mg | MEDICATED_PATCH | Freq: Every day | TRANSDERMAL | 0 refills | Status: DC

## 2018-05-24 MED ORDER — CYCLOBENZAPRINE HCL 5 MG PO TABS: 5.0000 mg | ORAL_TABLET | Freq: Three times a day (TID) | ORAL | Status: DC | PRN

## 2018-05-24 MED ORDER — MAGNESIUM OXIDE 400 (241.3 MG) MG PO TABS: 400.0000 mg | ORAL_TABLET | Freq: Every day | ORAL | 0 refills | Status: DC

## 2018-05-24 MED ORDER — MAGNESIUM OXIDE 400 (241.3 MG) MG PO TABS
400.0000 mg | ORAL_TABLET | Freq: Every day | ORAL | Status: DC
  Administered 2018-05-24: 400 mg via ORAL
  Filled 2018-05-24: qty 1

## 2018-05-24 MED ORDER — POLYETHYLENE GLYCOL 3350 17 G PO PACK
17.0000 g | PACK | Freq: Once | ORAL | Status: AC
  Administered 2018-05-24: 17 g via ORAL
  Filled 2018-05-24: qty 1

## 2018-05-24 NOTE — Progress Notes (Addendum)
Patient ID: [name], male   DOB: 1961-02-21, 57 y.o.   MRN: [id number]                                                                PROGRESS NOTE                                                                                                                                                                                                             Patient Demographics:    Tony Sullivan, is a 57 y.o. male, DOB - [birth year], EZM:[id number]  Admit date - 05/23/2018   Admitting Physician Debbe Odea, MD  Outpatient Primary MD for the patient is [name], FNP  LOS - 1  Outpatient Specialists:     Chief Complaint  Patient presents with  . Shortness of Breath  . Chest Pain       Brief Narrative    57 y.o. male with medical history of advanced COPD on home O2, ongoing smoking, HTN, anxiety who presented to the ED on 7/16 with dyspnea, was treated for a COPD flare with Albuterol Nebs and Solumedrol and discharged from the ED on a 5 day Prednisone taper.  He returns today with another flare. He states he woke up in the middle of the night with shortness of breath. It did not improve with a Neb treatment. He has been taking Prednisone 10 mg BID (prescribed as 10 mg daily but he takes an extra if he feels bad).  He did grow out strep pneumoniae from a sputum culture on 7/15 and per note in Epic, he was prescribed Levaquin by Geraldo Pitter, NP for 5 days.   ED Course: 93% on 4 [facility], given Solumedrol as well.       Subjective:    Tony Sullivan today states that his breathing about 50% better.  Afebrile overnite , slight cough (dry),  + constipation and leg cramps  No headache, No chest pain, No abdominal pain - No Nausea, No new weakness tingling or numbness   Assessment  & Plan :    Principal Problem:   COPD exacerbation (HCC) Active Problems:   Smoker   Chronic respiratory failure with hypoxia (HCC)   Essential hypertension   ETOH abuse  Acute on Chronic Hypoxic  Resp Failure  Copd (gold  2) w exacerbation  Cont Solumedrol 40mg  iv bid Cont Spiriva 1puff qday Cont albuterol neb tid and q4h prn  Cont levaquin iv Cont Mucinex  Smoker Cont Nicotine patch  Gerd Cont pepcid 20mg  po qhs  ETOH abuse Cont CIWA Cont folic acid 1mg  po qday  Hypertension Cont Amlodipine Cont Cozaar 100mg  po qday  Depression/Anxiety Cont risperdal 2mg  po qhs Cont loxitane 10mg  po qhs Cont Cogentin 0.5mg  po qhs  Constipation,  miralax   H/o RUQ pain Ultrasound abdomen  Leg cramp Magnesium oxide 400mg  po qday  Code Status :  FULL CODE  Family Communication  : w patient  Disposition Plan  : home  Barriers For Discharge :   Consults  :   none  Procedures  :   DVT Prophylaxis  :  Lovenox  SCDs   Lab Results  Component Value Date   PLT 242 05/24/2018    Antibiotics  :  Levaquin 7/28=>   Anti-infectives (From admission, onward)   Start     Dose/Rate Route Frequency Ordered Stop   05/24/18 1000  levofloxacin (LEVAQUIN) tablet 500 mg     500 mg Oral Daily 05/23/18 0844     05/23/18 0800  levofloxacin (LEVAQUIN) IVPB 750 mg     750 mg 100 mL/hr over 90 Minutes Intravenous  Once 05/23/18 0748 05/23/18 1134        Objective:   Vitals:   05/23/18 1932 05/24/18 0000 05/24/18 0450 05/24/18 0733  BP: (!) 141/82 125/82 (!) 128/97   Pulse: 83  78   Resp: 20  20   Temp: 98.3 F (36.8 C)  97.9 F (36.6 C)   TempSrc: Oral  Oral   SpO2: 91%  92% 96%    Wt Readings from Last 3 Encounters:  05/21/18 75.3 kg (166 lb)  05/12/18 77.1 kg (170 lb)  04/28/18 75.8 kg (167 lb)     Intake/Output Summary (Last 24 hours) at 05/24/2018 0814 Last data filed at 05/24/2018 0106 Gross per 24 hour  Intake 725 ml  Output 1200 ml  Net -475 ml     Physical Exam  Awake Alert, Oriented X 3, No new F.N deficits, Normal affect [place].AT,PERRAL Supple Neck,No JVD, No cervical lymphadenopathy appriciated.  Symmetrical Chest wall movement, slight bilateral exp  wheezing,  No crackles.  RRR,No Gallops,Rubs or new Murmurs, No Parasternal Heave +ve B.Sounds, Abd Soft, No tenderness, No organomegaly appriciated, No rebound - guarding or rigidity. No Cyanosis, Clubbing or edema, No new Rash or bruise       Data Review:    CBC Recent Labs  Lab 05/23/18 0516 05/24/18 0427  WBC 18.8* 19.6*  HGB 18.2* 15.5  HCT 54.3* 47.5  PLT 243 242  MCV 99.5 99.4  MCH 33.3 32.4  MCHC 33.5 32.6  RDW 16.0* 15.9*  LYMPHSABS 6.4*  --   MONOABS 1.7*  --   EOSABS 0.2  --   BASOSABS 0.0  --     Chemistries  Recent Labs  Lab 05/23/18 0516 05/24/18 0427  NA 143 142  K 3.7 4.2  CL 106 108  CO2 26 27  GLUCOSE 95 126*  BUN 13 14  CREATININE 0.87 0.84  CALCIUM 8.8* 9.0   ------------------------------------------------------------------------------------------------------------------ No results for input(s): CHOL, HDL, LDLCALC, TRIG, CHOLHDL, LDLDIRECT in the last 72 hours.  No results found for: HGBA1C ------------------------------------------------------------------------------------------------------------------ No results for input(s): TSH, T4TOTAL, T3FREE, THYROIDAB in the last 72 hours.  Invalid input(s): FREET3 ------------------------------------------------------------------------------------------------------------------ No results for  input(s): VITAMINB12, FOLATE, FERRITIN, TIBC, IRON, RETICCTPCT in the last 72 hours.  Coagulation profile No results for input(s): INR, PROTIME in the last 168 hours.  No results for input(s): DDIMER in the last 72 hours.  Cardiac Enzymes No results for input(s): CKMB, TROPONINI, MYOGLOBIN in the last 168 hours.  Invalid input(s): CK ------------------------------------------------------------------------------------------------------------------    Component Value Date/Time   BNP 31.4 06/17/2016 0844    Inpatient Medications  Scheduled Meds: . albuterol  2.5 mg Nebulization TID  . amLODipine   10 mg Oral Daily  . benztropine  0.5 mg Oral QHS  . dextromethorphan-guaiFENesin  1 tablet Oral BID  . enoxaparin (LOVENOX) injection  40 mg Subcutaneous Q24H  . famotidine  20 mg Oral QHS  . folic acid  1 mg Oral Daily  . levofloxacin  500 mg Oral Daily  . losartan  100 mg Oral Daily  . loxapine  10 mg Oral QHS  . methylPREDNISolone (SOLU-MEDROL) injection  40 mg Intravenous Q12H  . multivitamin with minerals  1 tablet Oral Daily  . nicotine  14 mg Transdermal Daily  . risperiDONE  2 mg Oral QHS  . thiamine  100 mg Oral Daily   Or  . thiamine  100 mg Intravenous Daily  . tiotropium  18 mcg Inhalation Daily   Continuous Infusions: . 0.9 % NaCl with KCl 20 mEq / L 50 mL/hr at 05/24/18 0628   PRN Meds:.acetaminophen **OR** acetaminophen, albuterol, LORazepam **OR** LORazepam, ondansetron **OR** ondansetron (ZOFRAN) IV, traMADol  Micro Results No results found for this or any previous visit (from the past 240 hour(s)).  Radiology Reports Dg Chest 2 View  Result Date: 05/12/2018 CLINICAL DATA:  Two week history of progressively worsening shortness of breath, chest discomfort and abdominal pain. Current history of COPD. EXAM: CHEST - 2 VIEW COMPARISON:  05/04/2018, 08/28/2017 and earlier. FINDINGS: Cardiomediastinal silhouette unremarkable, unchanged. Emphysematous changes throughout both lungs with mild hyperinflation, unchanged. Mildly prominent bronchovascular markings diffusely and mild central peribronchial thickening, unchanged. Lungs otherwise clear. No localized airspace consolidation. No pleural effusions. No pneumothorax. Normal pulmonary vascularity. Visualized bony thorax intact. IMPRESSION: 1.  No acute cardiopulmonary disease. 2.  Emphysema (ICD10-J43.9). Electronically Signed   By: Evangeline Dakin M.D.   On: 05/12/2018 09:19   Dg Chest 2 View  Result Date: 05/04/2018 CLINICAL DATA:  Chest tightness with sputum x1 day. EXAM: CHEST - 2 VIEW COMPARISON:  08/28/2017 FINDINGS:  Emphysematous hyperinflation of the lungs compatible with emphysema. No pulmonary consolidation, CHF, effusion or pneumothorax. Costophrenic angles are excluded on this current study bilaterally. Subpleural scarring is noted in the left mid lung. Heart size is within normal limits. There is minimal aortic atherosclerosis at the arch without aneurysmal dilatation. No aggressive nor acute osseous appearing abnormality. IMPRESSION: 1. Redemonstration of emphysematous hyperinflation of the lungs compatible COPD. 2. Slight aortic atherosclerosis without aneurysmal dilatation. Electronically Signed   By: Ashley Royalty M.D.   On: 05/04/2018 14:13   Ct Angio Chest Pe W/cm &/or Wo Cm  Result Date: 05/12/2018 CLINICAL DATA:  Shortness of breath with decreased oxygen saturation EXAM: CT ANGIOGRAPHY CHEST WITH CONTRAST TECHNIQUE: Multidetector CT imaging of the chest was performed using the standard protocol during bolus administration of intravenous contrast. Multiplanar CT image reconstructions and MIPs were obtained to evaluate the vascular anatomy. CONTRAST:  23mL ISOVUE-370 IOPAMIDOL (ISOVUE-370) INJECTION 76% COMPARISON:  Chest CT January 10, 2014; chest radiograph May 12, 2018 FINDINGS: Cardiovascular: There is no demonstrable pulmonary embolus. There is no thoracic aortic  aneurysm or dissection. Visualized great vessels appear unremarkable. Note that the right innominate and left common carotid arteries arise as a common trunk, an anatomic variant. There is no appreciable pericardial effusion or pericardial thickening. There is mild coronary artery calcification. The main pulmonary outflow tract has a diameter of 3.2 cm, prominent. Mediastinum/Nodes: Thyroid appears normal. There are occasional subcentimeter mediastinal lymph nodes. There are no lymph nodes meeting size criteria for pathologic significance. No esophageal lesions are evident. Lungs/Pleura: There is underlying centrilobular and paraseptal emphysematous  change. There is mild scarring in the bases. There is mild patchy atelectatic change in the inferior lingula. There is no edema or consolidation. No pleural effusion or pleural thickening evident. Upper Abdomen: There is a benign adenoma in the left adrenal measuring 1.6 x 1.5 cm. Visualized upper abdominal structures otherwise appear unremarkable except for atherosclerotic calcification in the aorta. Musculoskeletal: There are no blastic or lytic bone lesions. There are no evident chest wall lesions. Review of the MIP images confirms the above findings. IMPRESSION: 1. No demonstrable pulmonary embolus. No thoracic aortic aneurysm or dissection. Occasional foci of coronary artery calcification noted. 2. Underlying centrilobular emphysematous change. Areas of mild atelectasis and scarring. No edema or consolidation. 3. Prominence the main pulmonary outflow tract, a finding felt to be indicative of a degree of pulmonary arterial hypertension. 4.  No evident adenopathy. 5.  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 Emphysema (ICD10-J43.9). Electronically Signed   By: Lowella Grip III M.D.   On: 05/12/2018 11:18   Dg Chest Port 1 View  Result Date: 05/23/2018 CLINICAL DATA:  Shortness of breath and chest pain. History of COPD, asthma, current smoker. EXAM: PORTABLE CHEST 1 VIEW COMPARISON:  05/12/2018 FINDINGS: Heart size and pulmonary vascularity are normal. Emphysematous changes in the lungs. Central interstitial pattern consistent with chronic bronchitis. No airspace disease or consolidation in the lungs. No blunting of costophrenic angles. No pneumothorax. Mediastinal contours appear intact. IMPRESSION: Emphysematous and chronic bronchitic changes in the lungs. No evidence of active pulmonary disease. Electronically Signed   By: Lucienne Capers M.D.   On: 05/23/2018 06:13    Time Spent in minutes  30   Jani Gravel M.D on 05/24/2018 at 8:14 AM  Between 7am to 7pm - Pager - [phone]     After 7pm go to [email or web address] - password [facility]  [facility] -  Office  [phone]

## 2018-05-25 DIAGNOSIS — R109 Unspecified abdominal pain: Secondary | ICD-10-CM

## 2018-05-25 MED ORDER — PREDNISONE 10 MG PO TABS: ORAL_TABLET | ORAL | 0 refills | Status: DC

## 2018-05-25 NOTE — Progress Notes (Signed)
Discharge instructions explained to patient. Prescriptions given to patient. Discharged to wife via wheelchair.

## 2018-05-25 NOTE — Discharge Summary (Signed)
Tony Sullivan, is a 57 y.o. male  DOB 02-10-1961  MRN [id number].  Admission date:  05/23/2018  Admitting Physician  Debbe Odea, MD  Discharge Date:  05/25/2018   Primary MD  [name], FNP  Recommendations for primary care physician for things to follow:   Acute on Chronic Hypoxic Resp Failure Copd (gold  2) w exacerbation  Prednisone taper, 60mg  po qday x 2 days then 50mg  po qday x 2 days then 40mg  po qday x 2 days then 30mg  poq day x 2 days then 20mg  poq day x 2 days then 10mg  po qday x 2 days Cont Spiriva 1puff qday Cont albuterol neb tid and q4h prn  Cont levaquin 500mg  po qday x 5 days Cont Mucinex f/u with pcp in 1 week, f/u with pulmonary in 2 weeks  Smoker Cont Nicotine patch Pt counselled on smoking cessation x 3 minutes  Probably his trigger for this Copd exacerbation  Gerd Cont pepcid 20mg  po qhs  ETOH abuse Pt counselled to stop drinking  Hypertension Cont Amlodipine Cont Cozaar 100mg  po qday  Depression/Anxiety Cont risperdal 2mg  po qhs Cont loxitane 10mg  po qhs Cont Cogentin 0.5mg  po qhs  Constipation,  miralax   H/o RUQ pain Ultrasound abdomen=> negative for cholecystitis Please f/u with GI  Leg cramp Magnesium oxide 400mg  po qday     Admission Diagnosis  SOB (shortness of breath) [R06.02] Bronchitis [J40] COPD exacerbation (HCC) [J44.1]   Discharge Diagnosis  SOB (shortness of breath) [R06.02] Bronchitis [J40] COPD exacerbation (HCC) [J44.1]    Principal Problem:   COPD exacerbation (HCC) Active Problems:   Smoker   Chronic respiratory failure with hypoxia (HCC)   Essential hypertension   ETOH abuse   Abdominal pain      Past Medical History:  Diagnosis Date  . Asthma   . COPD (chronic obstructive pulmonary disease) ([place]) 2004   diagnosed in 2004, no PFT's to date.  Started on home O2 12/2013, after found to be desatting  at PCP's office, and referred to pulmonology.  . High blood pressure   . Seasonal allergies     Past Surgical History:  Procedure Laterality Date  . APPENDECTOMY  1980  . COLONOSCOPY WITH PROPOFOL N/A 12/10/2016   Procedure: COLONOSCOPY WITH PROPOFOL;  Surgeon: [name], MD;  Location: [facility];  Service: Endoscopy;  Laterality: N/A;  . HIP SURGERY Right   . SHOULDER ARTHROSCOPY Right        HPI  from the history and physical done on the day of admission:     56 y.o.malewith medical history ofadvanced COPD on home O2, ongoing smoking, HTN, anxiety who presented to the ED on 7/16 with dyspnea, was treated for a COPD flare with Albuterol Nebs and Solumedrol and discharged from the ED on a 5 day Prednisone taper.  He returns today with another flare.He states he woke up in the middle of the night with shortness of breath. It did not improve with a Neb treatment. He  has been taking Prednisone 10 mg BID (prescribed as 10 mg daily but he takes an extra if he feels bad). He did grow out strep pneumoniae from a sputum culture on 7/15 and per note in Epic, he was prescribed Levaquin by Geraldo Pitter, NPfor 5 days.  ED Course:93% on 4 L [place], givenSolumedrol as well.      Hospital Course:     Pt was admitted for Copd exacerbation and started on solumedrol iv , levaquin , spiriva, albuterol, and his breathing improved.  Pt felt constipated and given miralax.  Pt noted he has had abdominal fullness and pain.  Pt had abdominal ultrasound 7/28=> negative for cholecystitis.  Pt noted some leg cramps and he was given magnesium oxide as well as flexeril with relief. Pt is doing well this Am, breathing is great.  Pt requests discharge to home.      Follow UP  Follow-up Information    [name], FNP Follow up in 1 week(s).   Specialty:  Nurse Practitioner Contact information: [address] [place] [address] [phone]        [name], MD Follow up in 2 week(s).   Specialty:  Pulmonary Disease Contact information: [address] [place] [address] [phone]            Consults obtained -  none  Discharge Condition: stable  Diet and Activity recommendation: See Discharge Instructions below  Discharge Instructions        Discharge Medications     Allergies as of 05/25/2018      Reactions   Penicillins Anaphylaxis   Has patient had a PCN reaction causing immediate rash, facial/tongue/throat swelling, SOB or lightheadedness with hypotension: Yes Has patient had a PCN reaction causing severe rash involving mucus membranes or skin necrosis: No Has patient had a PCN reaction that required hospitalization No Has patient had a PCN reaction occurring within the last 10 years: No If all of the above answers are "NO", then may proceed with Cephalosporin use.   Sulfa Antibiotics Swelling   Swelling of hands and throat    Famotidine Other (See Comments)   Bloating of stomach, loss of appetite   Levocetirizine Other (See Comments)   Muscle cramps      Medication List    TAKE these medications   Aclidinium Bromide 400 MCG/ACT Aepb Commonly known as:  TUDORZA PRESSAIR Inhale 1 puff into the lungs 2 (two) times daily.   albuterol (2.5 MG/3ML) 0.083% nebulizer solution Commonly known as:  PROVENTIL Take 3 mLs (2.5 mg total) by nebulization every 4 (four) hours as needed for wheezing or shortness of breath.   albuterol 108 (90 Base) MCG/ACT inhaler Commonly known as:  PROAIR HFA INHALE TWO PUFFS UP TO EVERY 4 HOURS IF NEEDED FOR SHORT OF BREATH   amLODipine 10 MG tablet Commonly known as:  NORVASC Take 10 mg by mouth daily.   benztropine 0.5 MG tablet Commonly known as:  COGENTIN Take 0.5 mg by mouth at bedtime.   budesonide-formoterol 160-4.5 MCG/ACT inhaler Commonly known as:  SYMBICORT INHALE TWO PUFFS INTO THE LUNGS TWO (TWO) TIMES DAILY.   cetirizine 10 MG tablet Commonly known as:   ZYRTEC Take 10 mg by mouth daily.   docusate sodium 100 MG capsule Commonly known as:  COLACE Take 100 mg by mouth daily.   famotidine 20 MG tablet Commonly known as:  PEPCID One at bedtime What changed:    how much to  take  how to take this  when to take this  additional instructions   fluticasone 50 MCG/ACT nasal spray Commonly known as:  FLONASE Place 1 spray into both nostrils daily as needed for allergies or rhinitis.   FLUTTER Devi Use as directed   levofloxacin 500 MG tablet Commonly known as:  LEVAQUIN Take 1 tablet (500 mg total) by mouth daily for 5 days.   losartan 100 MG tablet Commonly known as:  COZAAR Take 100 mg by mouth daily.   loxapine 10 MG capsule Commonly known as:  LOXITANE Take 10 mg by mouth at bedtime.   magnesium oxide 400 (241.3 Mg) MG tablet Commonly known as:  MAG-OX Take 1 tablet (400 mg total) by mouth daily.   multivitamin capsule Take 1 capsule by mouth daily.   nicotine 14 mg/24hr patch Commonly known as:  NICODERM CQ - dosed in mg/24 hours Place 1 patch (14 mg total) onto the skin daily.   OXYGEN Inhale 2 L into the lungs as directed. 2-3 liters with sleep and exertion as needed for low oxygen   pantoprazole 40 MG tablet Commonly known as:  PROTONIX Take 40 mg by mouth daily.   predniSONE 10 MG tablet Commonly known as:  DELTASONE 60mg  po qday x 2 days then 50mg  po qday x 2 days then 40mg  po qday x 2 days then 30mg  po qday x 2 days then 20mg  po qday x 2 days then 10mg  po qday x 2 days What changed:  additional instructions   risperiDONE 2 MG tablet Commonly known as:  RISPERDAL Take 2 mg by mouth at bedtime.   traMADol 50 MG tablet Commonly known as:  ULTRAM Take 50-100 mg by mouth 3 (three) times daily as needed for moderate pain.   TRUBIOTICS PO Take 1 capsule by mouth daily.       Major procedures and Radiology Reports - PLEASE review detailed and final reports for all details, in brief -      Dg  Chest 2 View  Result Date: 05/12/2018 CLINICAL DATA:  Two week history of progressively worsening shortness of breath, chest discomfort and abdominal pain. Current history of COPD. EXAM: CHEST - 2 VIEW COMPARISON:  05/04/2018, 08/28/2017 and earlier. FINDINGS: Cardiomediastinal silhouette unremarkable, unchanged. Emphysematous changes throughout both lungs with mild hyperinflation, unchanged. Mildly prominent bronchovascular markings diffusely and mild central peribronchial thickening, unchanged. Lungs otherwise clear. No localized airspace consolidation. No pleural effusions. No pneumothorax. Normal pulmonary vascularity. Visualized bony thorax intact. IMPRESSION: 1.  No acute cardiopulmonary disease. 2.  Emphysema (ICD10-J43.9). Electronically Signed   By: Evangeline Dakin M.D.   On: 05/12/2018 09:19   Dg Chest 2 View  Result Date: 05/04/2018 CLINICAL DATA:  Chest tightness with sputum x1 day. EXAM: CHEST - 2 VIEW COMPARISON:  08/28/2017 FINDINGS: Emphysematous hyperinflation of the lungs compatible with emphysema. No pulmonary consolidation, CHF, effusion or pneumothorax. Costophrenic angles are excluded on this current study bilaterally. Subpleural scarring is noted in the left mid lung. Heart size is within normal limits. There is minimal aortic atherosclerosis at the arch without aneurysmal dilatation. No aggressive nor acute osseous appearing abnormality. IMPRESSION: 1. Redemonstration of emphysematous hyperinflation of the lungs compatible COPD. 2. Slight aortic atherosclerosis without aneurysmal dilatation. Electronically Signed   By: Ashley Royalty M.D.   On: 05/04/2018 14:13   Ct Angio Chest Pe W/cm &/or Wo Cm  Result Date: 05/12/2018 CLINICAL DATA:  Shortness of breath with decreased oxygen saturation EXAM: CT ANGIOGRAPHY CHEST WITH CONTRAST TECHNIQUE:  Multidetector CT imaging of the chest was performed using the standard protocol during bolus administration of intravenous contrast. Multiplanar CT  image reconstructions and MIPs were obtained to evaluate the vascular anatomy. CONTRAST:  38mL ISOVUE-370 IOPAMIDOL (ISOVUE-370) INJECTION 76% COMPARISON:  Chest CT January 10, 2014; chest radiograph May 12, 2018 FINDINGS: Cardiovascular: There is no demonstrable pulmonary embolus. There is no thoracic aortic aneurysm or dissection. Visualized great vessels appear unremarkable. Note that the right innominate and left common carotid arteries arise as a common trunk, an anatomic variant. There is no appreciable pericardial effusion or pericardial thickening. There is mild coronary artery calcification. The main pulmonary outflow tract has a diameter of 3.2 cm, prominent. Mediastinum/Nodes: Thyroid appears normal. There are occasional subcentimeter mediastinal lymph nodes. There are no lymph nodes meeting size criteria for pathologic significance. No esophageal lesions are evident. Lungs/Pleura: There is underlying centrilobular and paraseptal emphysematous change. There is mild scarring in the bases. There is mild patchy atelectatic change in the inferior lingula. There is no edema or consolidation. No pleural effusion or pleural thickening evident. Upper Abdomen: There is a benign adenoma in the left adrenal measuring 1.6 x 1.5 cm. Visualized upper abdominal structures otherwise appear unremarkable except for atherosclerotic calcification in the aorta. Musculoskeletal: There are no blastic or lytic bone lesions. There are no evident chest wall lesions. Review of the MIP images confirms the above findings. IMPRESSION: 1. No demonstrable pulmonary embolus. No thoracic aortic aneurysm or dissection. Occasional foci of coronary artery calcification noted. 2. Underlying centrilobular emphysematous change. Areas of mild atelectasis and scarring. No edema or consolidation. 3. Prominence the main pulmonary outflow tract, a finding felt to be indicative of a degree of pulmonary arterial hypertension. 4.  No evident adenopathy.  5.  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 Emphysema (ICD10-J43.9). Electronically Signed   By: Lowella Grip III M.D.   On: 05/12/2018 11:18   US Abdomen Complete  Result Date: 05/24/2018 CLINICAL DATA:  Abdominal pain. EXAM: ABDOMEN ULTRASOUND COMPLETE COMPARISON:  CT of the abdomen and pelvis 06/12/2015 FINDINGS: Gallbladder: No gallstones or wall thickening visualized. No sonographic Murphy sign noted by sonographer. The gallbladder is contracted. This likely reflects a non-fasting state. Common bile duct: Diameter: 4 mm, within normal limits. Liver: No focal lesion identified. Within normal limits in parenchymal echogenicity. Portal vein is patent on color Doppler imaging with normal direction of blood flow towards the liver. IVC: No abnormality visualized. Pancreas: Visualized portion unremarkable. Spleen: Size and appearance within normal limits. Right Kidney: Length: 11.3 cm, within normal limits. Echogenicity within normal limits. No mass or hydronephrosis visualized. Left Kidney: Length: 10.9 cm, within normal limits. Echogenicity within normal limits. No mass or hydronephrosis visualized. Abdominal aorta: No aneurysm visualized. Other findings: None. IMPRESSION: Negative abdominal ultrasound. No acute or focal lesion to explain the patient's symptoms. Electronically Signed   By: San Morelle M.D.   On: 05/24/2018 11:36   Dg Chest Port 1 View  Result Date: 05/23/2018 CLINICAL DATA:  Shortness of breath and chest pain. History of COPD, asthma, current smoker. EXAM: PORTABLE CHEST 1 VIEW COMPARISON:  05/12/2018 FINDINGS: Heart size and pulmonary vascularity are normal. Emphysematous changes in the lungs. Central interstitial pattern consistent with chronic bronchitis. No airspace disease or consolidation in the lungs. No blunting of costophrenic angles. No pneumothorax. Mediastinal contours appear intact. IMPRESSION: Emphysematous and chronic bronchitic changes in the  lungs. No evidence of active pulmonary disease. Electronically Signed   By: Oren Beckmann.D.  On: 05/23/2018 06:13    Micro Results      No results found for this or any previous visit (from the past 240 hour(s)).     Today   Subjective    Tony Sullivan today states that his breathing is back to normal.    no headache,no chest abdominal pain,no new weakness tingling or numbness, feels much better wants to go home today.    Objective   Blood pressure 135/84, pulse 69, temperature 98 F (36.7 C), temperature source Oral, resp. rate 15, height 5\' 10"  (1.778 m), weight 75.3 kg (166 lb), SpO2 90 %.   Intake/Output Summary (Last 24 hours) at 05/25/2018 0707 Last data filed at 05/24/2018 1300 Gross per 24 hour  Intake 600 ml  Output 300 ml  Net 300 ml    Exam Awake Alert, Oriented x 3, No new F.N deficits, Normal affect [place].AT,PERRAL Supple Neck,No JVD, No cervical lymphadenopathy appriciated.  Symmetrical Chest wall movement, Good air movement bilaterally, CTAB RRR,No Gallops,Rubs or new Murmurs, No Parasternal Heave +ve B.Sounds, Abd Soft, Non tender, No organomegaly appriciated, No rebound -guarding or rigidity. No Cyanosis, Clubbing or edema, No new Rash or bruise   Data Review   CBC w Diff:  Lab Results  Component Value Date   WBC 19.6 (H) 05/24/2018   HGB 15.5 05/24/2018   HCT 47.5 05/24/2018   PLT 242 05/24/2018   LYMPHOPCT 34 05/23/2018   MONOPCT 9 05/23/2018   EOSPCT 1 05/23/2018   BASOPCT 0 05/23/2018    CMP:  Lab Results  Component Value Date   NA 142 05/24/2018   K 4.2 05/24/2018   CL 108 05/24/2018   CO2 27 05/24/2018   BUN 14 05/24/2018   CREATININE 0.84 05/24/2018   PROT 7.4 03/29/2017   ALBUMIN 4.0 03/29/2017   BILITOT 1.5 (H) 03/29/2017   ALKPHOS 76 03/29/2017   AST 27 03/29/2017   ALT 23 03/29/2017  .   Total Time in preparing paper work, data evaluation and todays exam - 20 minutes  Jani Gravel M.D on 05/25/2018 at 7:07  AM  [facility]   Office  [phone]

## 2018-05-28 ENCOUNTER — Ambulatory Visit (HOSPITAL_COMMUNITY): Payer: Medicaid Other

## 2018-06-10 ENCOUNTER — Encounter: Payer: Self-pay | Admitting: Internal Medicine

## 2018-06-10 ENCOUNTER — Ambulatory Visit (INDEPENDENT_AMBULATORY_CARE_PROVIDER_SITE_OTHER): Payer: Medicaid Other | Admitting: Internal Medicine

## 2018-06-10 VITALS — BP 104/64 | HR 105 | Ht 70.0 in | Wt 167.0 lb

## 2018-06-10 DIAGNOSIS — J449 Chronic obstructive pulmonary disease, unspecified: Secondary | ICD-10-CM

## 2018-06-10 DIAGNOSIS — J9611 Chronic respiratory failure with hypoxia: Secondary | ICD-10-CM | POA: Diagnosis not present

## 2018-06-10 DIAGNOSIS — F17210 Nicotine dependence, cigarettes, uncomplicated: Secondary | ICD-10-CM

## 2018-06-10 NOTE — Progress Notes (Signed)
Subjective:   Patient ID: Tony Sullivan, male    DOB: 07-12-1961  MRN: [id number]    Brief patient profile:  38  yobm active smokker with breathing difficulties since 2004 dx as copd on advair 250 /50 but getting worse since 2014 and placed on 02 for the first time in March 2015 and referred 01/07/2014 to pulmonary clinic by Dr Kennon Holter with GOLD II copd criteria established 02/14/14    History of Present Illness  01/07/2014 1st  Pulmonary office visit/ [facility]  Chief Complaint  Patient presents with  . Advice Only    Referred for COPD, SOB.  Pt c/o SOB, low 02, prod cough with white mucous.  Pt forgot med list, unsure of what all he is taking  still can do a [employer] but uses [facility] parking, assoc with congested coughing esp in am worse in winter. rec Stop advair   Plan A =  Automatic =  symbicort 160 Take 2 puffs first thing in am and then another 2 puffs about 12 hours later. spiriva in am only  Plan B = Backup- Proventil use it only if you can't your breath - ok to use up to every 4 hours if needed Plan C = Nebulizer up to every 4 hours if needed only if you try plan B first and it doesn't work    01/30/2018  f/u ov/[facility] re: COPD GOLD II / anxiety  Chief Complaint  Patient presents with  . Follow-up    increased SOB x 1week,productive cough light green in color  Dyspnea:  X 50 ft on 2lpm  Cough: more freq x one week/ no pattern> has oxy but hasn't started yet  Sleep: on 02 2lpm @ 30 degrees and worse wheeze in am when rising SABA use:  saba hfa = neb  Including x 2 in am of ov  rec Pepcid 20 mg one at bedtime  See calendar for specific medication instructions      03/16/2018  f/u ov/[facility] re: GOLD II/ worse sob/ no med calendar  Chief Complaint  Patient presents with  . Follow-up    Breathing has been progressively worse since last visit. He is noticing dizziness when he gets SOB.  He has been using his proair 4-5 x per day and neb with albuterol 2 x daily. He has  noticed some swelling in his left foot over the past wk.    Dyspnea:  No better/ 50 ft gives out (walks fast pace)  Cough: mild hacking Sleep: 30 degrees and 2-3 lpm  SABA use:   Already this am used prednisone 10 mg x 2 / symb x 2/ tudorza / saba x 3 pffs so far  rec Due  to your copd and respiratory failure I recommend you stay home through Wednesay the 22nd to use your 02 and multiple pulmonary medications around the clock as prescribed  See calendar for specific medication instructions and bring it back for each and every office visit for every healthcare provider you see.  Without it,  you may not receive the best quality medical care that we feel you deserve.    04/28/18  NP recs Check CXR and sputum culture Prednisone taper (4,4,4,3,3,3,2,2,2,); then continue as prescribed  Use neb treatments as needed for wheeze/cough  (do not duplicate with albuterol inhaler) Takes doxycyline per PCP, monitor how frequently you are using this. If taken regularly you could develop antibiotic resistance.  We advise that you stop smoking (do not smoke with  oxygen tank on) PLEASE bring in medications to next apt  Follow-up in 6 weeks      06/10/2018  f/u ov/[facility] re: post hosp f/u - did not bring meds as req / not using 02 as rec (uses p sob with activity) Chief Complaint  Patient presents with  . Follow-up    Breathing has improved some. He has d/ced alot of his meds and feels this has helped. He is using his neb 2 x daily and albuterol inhaler once daily.   Dyspnea:  MMRC3 = can't walk 100 yards even at a slow pace at a flat grade s stopping due to sob  Cough: clear mucus worse in am  SABA use: puffer << neb (not the instructions given)  02:   2 lpm hs and daytime prn  Last pred x 4 days prior to OV   taken at bedtime   No obvious day to day or daytime variability or assoc excess/ purulent sputum or mucus plugs or hemoptysis or cp or chest tightness, subjective wheeze or overt sinus or hb  symptoms.   Sleeping: 30 degrees/3 pillows total no noct inhalers  without nocturnal  or early am exacerbation  of respiratory  c/o's or need for noct saba. Also denies any obvious fluctuation of symptoms with weather or environmental changes or other aggravating or alleviating factors except as outlined above   No unusual exposure hx or h/o childhood pna/ asthma or knowledge of premature birth.  Current Allergies, Complete Past Medical History, Past Surgical History, Family History, and Social History were reviewed in [employer] record.  ROS  The following are not active complaints unless bolded Hoarseness, sore throat, dysphagia, dental problems, itching, sneezing, nasal congestion   or discharge of excess mucus or purulent secretions, ear ache,   fever, chills, sweats, unintended wt loss or wt gain, classically pleuritic or exertional cp,  orthopnea pnd or arm/hand swelling  or leg swelling, presyncope, palpitations, abdominal pain, anorexia, nausea, vomiting, diarrhea  or change in bowel habits or change in bladder habits, change in stools or change in urine, dysuria, hematuria,  rash, arthralgias, visual complaints, headache, numbness, weakness or ataxia or problems with walking or coordination,  change in mood or  memory.        Current Meds  Medication Sig  . Aclidinium Bromide (TUDORZA PRESSAIR) 400 MCG/ACT AEPB Inhale 1 puff into the lungs 2 (two) times daily.  Marland Kitchen albuterol (PROAIR HFA) 108 (90 Base) MCG/ACT inhaler INHALE TWO PUFFS UP TO EVERY 4 HOURS IF NEEDED FOR SHORT OF BREATH  . albuterol (PROVENTIL) (2.5 MG/3ML) 0.083% nebulizer solution Take 3 mLs (2.5 mg total) by nebulization every 4 (four) hours as needed for wheezing or shortness of breath.  Marland Kitchen amLODipine (NORVASC) 10 MG tablet Take 10 mg by mouth daily.   . budesonide-formoterol (SYMBICORT) 160-4.5 MCG/ACT inhaler INHALE TWO PUFFS INTO THE LUNGS TWO (TWO) TIMES DAILY.  Marland Kitchen losartan (COZAAR) 100 MG tablet  Take 100 mg by mouth daily.  Marland Kitchen loxapine (LOXITANE) 10 MG capsule Take 10 mg by mouth at bedtime.   . magnesium oxide (MAG-OX) 400 (241.3 Mg) MG tablet Take 1 tablet (400 mg total) by mouth daily.  . Multiple Vitamin (MULTIVITAMIN) capsule Take 1 capsule by mouth daily.  . nicotine (NICODERM CQ - DOSED IN MG/24 HOURS) 14 mg/24hr patch Place 1 patch (14 mg total) onto the skin daily.  . OXYGEN Inhale 2 L into the lungs as directed. 2-3 liters with sleep and exertion  as needed for low oxygen  . Respiratory Therapy Supplies (FLUTTER) DEVI Use as directed                      Objective:   Physical Exam  amb bm nad   06/15/2014  164  >  09/15/2014 158 >   02/20/2015 >156 03/22/2015 >  07/31/2015   150 > 12/05/2015  161   > 04/01/2016    151 > 07/29/2016  152 >   02/25/2017  153 >  06/05/2017  153 > 11/03/2017   166 > 01/30/2018  167> 03/16/2018  168 > 06/10/2018  167   Vital signs reviewed - Note on arrival 02 sats  94% on RA     HEENT: nl dentition / oropharynx. Nl external ear canals without cough reflex -  Mod bilateral non-specific turbinate edema     NECK :  without JVD/Nodes/TM/ nl carotid upstrokes bilaterally   LUNGS: no acc muscle use,  Mod barrel  contour chest wall with bilateral  Distant bs s audible wheeze and  without cough on insp or exp maneuver and mod  Hyperresonant  to  percussion bilaterally     CV:  RRR  no s3 or murmur or increase in P2, and no edema   ABD:  soft and nontender with pos mid insp Hoover's  in the supine position. No bruits or organomegaly appreciated, bowel sounds nl  MS:   Nl gait/  ext warm without deformities, calf tenderness, cyanosis or clubbing No obvious joint restrictions   SKIN: warm and dry without lesions    NEURO:  alert, approp, nl sensorium with  no motor or cerebellar deficits apparent.

## 2018-06-10 NOTE — Patient Instructions (Addendum)
Plan A = Automatic = symbicort 160 Take 2 puffs first thing in am and then another 2 puffs about 12 hours later along with tudorza one twice daily   Plan B = Backup Only use your albuterol inhaler (Proair) as a rescue medication to be used if you can't catch your breath by resting or doing a relaxed purse lip breathing pattern.  - The less you use it, the better it will work when you need it. - Ok to use the inhaler up to 2 puffs  every 4 hours if you must but call for appointment if use goes up over your usual need - Don't leave home without it !!  (think of it like the spare tire for your car)   Plan C = Crisis - only use your albuterol nebulizer if you first try Plan B and it fails to help > ok to use the nebulizer up to every 4 hours but if start needing it regularly call for immediate appointment   Use 02 to keep your saturations over 90%  > ok to adjust upwards if needed   See Tammy NP w/in 2 weeks or first available  with all your medications, even over the counter meds, separated in two separate bags, the ones you take no matter what vs the ones you stop once you feel better and take only as needed when you feel you need them.   Tammy  will generate for you a new user friendly medication calendar that will put [place] all on the same page re: your medication use.

## 2018-06-11 ENCOUNTER — Encounter: Payer: Self-pay | Admitting: Internal Medicine

## 2018-06-11 NOTE — Assessment & Plan Note (Signed)
-   02/14/2014   try symbicort 160 2bid  - PFT's  02/14/2014   FEV1 1.82 (53%) and ratio 47 no better p alb (p spiriva and symbicort 160)  and DLCO 32 with 37%  07/2014 >Spiriva stopped  09/15/2014 Med calendar> not using 02/20/2015   - Declined rehab 07/31/15  - Quit smoking 11/2016  - flutter valve 04/01/2016  -med calendar 08/20/16  - added prn pred to action plan 12/05/2016  - changed to maint pred 02/25/2017 with floor of 10 mg daily >>>  - 06/05/2017  After extensive coaching device effectiveness =    90% smi > change tudorza to spiriva 2.5 x 2 > unable to afford - 11/03/2017  After extensive coaching inhaler device  effectiveness =    90% with DPI/ tudorza  - add pepcid 20 mg at hs 01/30/2018 for noct/ am wheeze - Spirometry 03/16/2018  FEV1 1.16 (34%)  Ratio 57 p am symb/ tudorza  - 06/10/2018  After extensive coaching inhaler device  effectiveness =    90%  - referred again to rehab 06/10/2018     Group D in terms of symptom/risk and laba/lama/ICS  therefore appropriate rx at this point  And very poor insight into self-management for which she is an ideal candidate for pulmonary rehabilitation refilled agree to go, which I strongly advised.  In meantime there is nothing else to offer here other than a better understanding of how to use albuterol to prevent tachyphylaxis from overuse.

## 2018-06-11 NOTE — Assessment & Plan Note (Addendum)
4-5 min discussion re active cigarette smoking in addition to office E&M  Ask about tobacco use:   ongoing Advise quitting   Advised re risk of 02/cigs Assess willingness:  Not committed at this point Assist in quit attempt:  Per PCP when ready/ in meantime if uses nicotine gum should not use reduce the nicotine exp in cigs accordingly and avoid worsening nicotine addiction Arrange follow up:   Follow up per Primary Care planned

## 2018-06-24 ENCOUNTER — Ambulatory Visit (INDEPENDENT_AMBULATORY_CARE_PROVIDER_SITE_OTHER): Payer: Medicaid Other | Admitting: Nurse Practitioner

## 2018-06-24 ENCOUNTER — Encounter: Payer: Self-pay | Admitting: *Deleted

## 2018-06-24 ENCOUNTER — Encounter: Payer: Self-pay | Admitting: General Surgery

## 2018-06-24 DIAGNOSIS — J449 Chronic obstructive pulmonary disease, unspecified: Secondary | ICD-10-CM

## 2018-06-24 DIAGNOSIS — Z23 Encounter for immunization: Secondary | ICD-10-CM

## 2018-06-24 DIAGNOSIS — J9611 Chronic respiratory failure with hypoxia: Secondary | ICD-10-CM

## 2018-06-24 MED ORDER — ACLIDINIUM BROMIDE 400 MCG/ACT IN AEPB: 1.0000 | INHALATION_SPRAY | Freq: Two times a day (BID) | RESPIRATORY_TRACT | 5 refills | Status: DC

## 2018-06-24 MED ORDER — PREDNISONE 10 MG PO TABS: ORAL_TABLET | ORAL | 3 refills | Status: DC

## 2018-06-24 NOTE — Progress Notes (Signed)
@Patient  ID: Tony Sullivan, male    DOB: 1961/01/17, 57 y.o.   MRN: [id number]  Chief Complaint  Patient presents with  . 2 Week Follow-up    Referring provider: [name], FNP  HPI  57 year old male smoker followed for gold 2 COPD, and oxygen dependent respiratory failure   TEST   - Spirometry 03/16/18 FEV1 1.16 (34%) ratio 57 p am symb/tudorza - PFT's 02/14/2014 FEV1 1.82 (53%) and ratio 47 no better p alb (p spiriva and symbicort 160) and DLCO 32 with 37%    06-24-18 - Follow up: COPD/O2 RF Patient returns for 1 month follow up for COPD. He has been doing well with no flare cough or wheezing. States that he has cut back on smoking. Gets winded with activity. Slow going first thing in the morning until he can do his inhalers. He remains on 20 mg prednisone daily. He is on Symbicort and tudorza daily. Remains on oxygen 2L with activity and at bedtime. Denies chest pain, orthopnea, PND, or increased leg swelling.    Allergies  Allergen Reactions  . Penicillins Anaphylaxis    Has patient had a PCN reaction causing immediate rash, facial/tongue/throat swelling, SOB or lightheadedness with hypotension: Yes Has patient had a PCN reaction causing severe rash involving mucus membranes or skin necrosis: No Has patient had a PCN reaction that required hospitalization No Has patient had a PCN reaction occurring within the last 10 years: No If all of the above answers are "NO", then may proceed with Cephalosporin use.   . Sulfa Antibiotics Swelling    Swelling of hands and throat   . Famotidine Other (See Comments)    Bloating of stomach, loss of appetite  . Levocetirizine Other (See Comments)    Muscle cramps    Immunization History  Administered Date(s) Administered  . Influenza Split 08/09/2013, 08/03/2014  . Influenza Whole 07/28/2017  . Influenza,inj,Quad PF,6+ Mos 07/31/2015, 07/29/2016, 06/24/2018  . Pneumococcal Conjugate-13 07/31/2015  . Pneumococcal  Polysaccharide-23 08/03/2014    Past Medical History:  Diagnosis Date  . Asthma   . COPD (chronic obstructive pulmonary disease) ([place]) 2004   diagnosed in 2004, no PFT's to date.  Started on home O2 12/2013, after found to be desatting at PCP's office, and referred to pulmonology.  . High blood pressure   . Seasonal allergies     Tobacco History: Social History   Tobacco Use  Smoking Status Current Some Day Smoker  . Packs/day: 0.50  . Years: 36.00  . Pack years: 18.00  . Types: Cigarettes  Smokeless Tobacco Never Used   Ready to quit: Not Answered Counseling given: Not Answered   Outpatient Encounter Medications as of 06/24/2018  Medication Sig  . Aclidinium Bromide (TUDORZA PRESSAIR) 400 MCG/ACT AEPB Inhale 1 puff into the lungs 2 (two) times daily.  Marland Kitchen albuterol (PROAIR HFA) 108 (90 Base) MCG/ACT inhaler INHALE TWO PUFFS UP TO EVERY 4 HOURS IF NEEDED FOR SHORT OF BREATH  . albuterol (PROVENTIL) (2.5 MG/3ML) 0.083% nebulizer solution Take 3 mLs (2.5 mg total) by nebulization every 4 (four) hours as needed for wheezing or shortness of breath.  Marland Kitchen amLODipine (NORVASC) 10 MG tablet Take 10 mg by mouth daily.   . benztropine (COGENTIN) 0.5 MG tablet Take 0.5 mg by mouth daily.  . budesonide-formoterol (SYMBICORT) 160-4.5 MCG/ACT inhaler INHALE TWO PUFFS INTO THE LUNGS TWO (TWO) TIMES DAILY.  . cetirizine (ZYRTEC) 10 MG tablet Take 10 mg by mouth daily.  Marland Kitchen Dexlansoprazole  30 MG capsule Take 30 mg by mouth daily.  Marland Kitchen docusate sodium (COLACE) 100 MG capsule Take 100 mg by mouth daily.  . famotidine (PEPCID) 20 MG tablet Take 20 mg by mouth at bedtime.  . fluticasone (FLONASE) 50 MCG/ACT nasal spray Place into both nostrils daily.  Marland Kitchen losartan (COZAAR) 100 MG tablet Take 100 mg by mouth daily.  Marland Kitchen loxapine (LOXITANE) 10 MG capsule Take 10 mg by mouth at bedtime.   Marland Kitchen MAGNESIUM CITRATE PO Take by mouth. Patient takes 2 softgel caps every two weeks.  . magnesium oxide (MAG-OX) 400 (241.3  Mg) MG tablet Take 1 tablet (400 mg total) by mouth daily.  . Multiple Vitamin (MULTIVITAMIN) capsule Take 1 capsule by mouth daily.  . nicotine (NICODERM CQ - DOSED IN MG/24 HOURS) 14 mg/24hr patch Place 1 patch (14 mg total) onto the skin daily.  . OXYGEN Inhale 2 L into the lungs as directed. 2-3 liters with sleep and exertion as needed for low oxygen  . predniSONE (DELTASONE) 10 MG tablet Take 10 mg by mouth daily with breakfast. Take 1 - 3 tablets a day as needed  . Probiotic Product (TRUBIOTICS) CAPS Take by mouth daily. Patient takes 1 capsule daily.  Marland Kitchen Respiratory Therapy Supplies (FLUTTER) DEVI Use as directed  . risperiDONE (RISPERDAL) 2 MG tablet Take 2 mg by mouth at bedtime.  . traMADol (ULTRAM) 50 MG tablet Take 50 mg by mouth every 6 (six) hours as needed.   No facility-administered encounter medications on file as of 06/24/2018.      Review of Systems  Review of Systems  Constitutional: Negative.   HENT: Negative.   Respiratory: Positive for shortness of breath. Negative for cough.   Cardiovascular: Negative.   Gastrointestinal: Negative.   Allergic/Immunologic: Negative.   Neurological: Negative.   Psychiatric/Behavioral: Negative.        Physical Exam  BP 124/68 (BP Location: Left Arm, Patient Position: Sitting, Cuff Size: Normal)   Pulse 90   Ht 5\' 10"  (1.778 m)   Wt 168 lb 6.4 oz (76.4 kg)   SpO2 90% Comment: on room air  BMI 24.16 kg/m   Wt Readings from Last 5 Encounters:  06/24/18 168 lb 6.4 oz (76.4 kg)  06/10/18 167 lb (75.8 kg)  05/25/18 166 lb (75.3 kg)  05/21/18 166 lb (75.3 kg)  05/12/18 170 lb (77.1 kg)     Physical Exam  Constitutional: He is oriented to person, place, and time. He appears well-developed and well-nourished. No distress.  Cardiovascular: Normal rate and regular rhythm.  Pulmonary/Chest: Effort normal and breath sounds normal. He has no wheezes. He has no rales.  Neurological: He is alert and oriented to person, place,  and time.  Skin: Skin is warm and dry.  Psychiatric: He has a normal mood and affect.  Nursing note and vitals reviewed.     Assessment & Plan:   COPD  GOLD II Patient Instructions  May use saline spray for nasal dryness/congestion continue current medications  Medication calender updated Flu vaccine today Follow up in 1 month with Dr. [name] - bring all medications Please try to quit smoking     Chronic respiratory failure with hypoxia ([place]) Continue on O2     Fenton Foy, NP 06/24/2018

## 2018-06-24 NOTE — Addendum Note (Signed)
Addended by: Parke Poisson E on: 06/24/2018 05:19 PM   Modules accepted: Orders

## 2018-06-24 NOTE — Patient Instructions (Addendum)
May use saline spray for nasal dryness/congestion continue current medications Medication calender updated Flu vaccine today Follow up in 1 month with Dr. [name] - bring all medications Please try to quit smoking

## 2018-06-24 NOTE — Assessment & Plan Note (Signed)
Continue on O2 

## 2018-06-24 NOTE — Assessment & Plan Note (Signed)
Patient Instructions  May use saline spray for nasal dryness/congestion continue current medications  Medication calender updated Flu vaccine today Follow up in 1 month with Dr. [name] - bring all medications Please try to quit smoking

## 2018-06-25 NOTE — Progress Notes (Signed)
Chart and office note reviewed in detail  > agree with a/p as outlined    

## 2018-07-27 ENCOUNTER — Encounter: Payer: Self-pay | Admitting: Internal Medicine

## 2018-07-27 ENCOUNTER — Ambulatory Visit (INDEPENDENT_AMBULATORY_CARE_PROVIDER_SITE_OTHER): Payer: Medicaid Other | Admitting: Internal Medicine

## 2018-07-27 VITALS — BP 118/80 | HR 88 | Ht 71.0 in | Wt 169.4 lb

## 2018-07-27 DIAGNOSIS — J449 Chronic obstructive pulmonary disease, unspecified: Secondary | ICD-10-CM | POA: Diagnosis not present

## 2018-07-27 DIAGNOSIS — F17210 Nicotine dependence, cigarettes, uncomplicated: Secondary | ICD-10-CM | POA: Diagnosis not present

## 2018-07-27 DIAGNOSIS — J9611 Chronic respiratory failure with hypoxia: Secondary | ICD-10-CM

## 2018-07-27 MED ORDER — TIOTROPIUM BROMIDE MONOHYDRATE 2.5 MCG/ACT IN AERS: INHALATION_SPRAY | RESPIRATORY_TRACT | 11 refills | Status: DC

## 2018-07-27 MED ORDER — TIOTROPIUM BROMIDE MONOHYDRATE 2.5 MCG/ACT IN AERS: 2.0000 | INHALATION_SPRAY | Freq: Every day | RESPIRATORY_TRACT | 0 refills | Status: DC

## 2018-07-27 NOTE — Progress Notes (Signed)
Subjective:   Patient ID: Tony Sullivan, male    DOB: Dec 14, 1960  MRN: [id number]    Brief patient profile:  70  yobm active smokker with breathing difficulties since 2004 dx as copd on advair 250 /50 but getting worse since 2014 and placed on 02 for the first time in March 2015 and referred 01/07/2014 to pulmonary clinic by Dr Kennon Holter with GOLD II copd criteria established 02/14/14    History of Present Illness  01/07/2014 1st [facility] office visit/ [name]  Chief Complaint  Patient presents with  . Advice Only    Referred for COPD, SOB.  Pt c/o SOB, low 02, prod cough with white mucous.  Pt forgot med list, unsure of what all he is taking  still can do a [employer] but uses [facility] parking, assoc with congested coughing esp in am worse in winter. rec Stop advair   Plan A =  Automatic =  symbicort 160 Take 2 puffs first thing in am and then another 2 puffs about 12 hours later. spiriva in am only  Plan B = Backup- Proventil use it only if you can't your breath - ok to use up to every 4 hours if needed Plan C = Nebulizer up to every 4 hours if needed only if you try plan B first and it doesn't work    01/30/2018  f/u ov/Wajiha Versteeg re: COPD GOLD II / anxiety  Chief Complaint  Patient presents with  . Follow-up    increased SOB x 1week,productive cough light green in color  Dyspnea:  X 50 ft on 2lpm  Cough: more freq x one week/ no pattern> has oxy but hasn't started yet  Sleep: on 02 2lpm @ 30 degrees and worse wheeze in am when rising SABA use:  saba hfa = neb  Including x 2 in am of ov  rec Pepcid 20 mg one at bedtime  See calendar for specific medication instructions      03/16/2018  f/u ov/[name] re: GOLD II/ worse sob/ no med calendar  Chief Complaint  Patient presents with  . Follow-up    Breathing has been progressively worse since last visit. He is noticing dizziness when he gets SOB.  He has been using his proair 4-5 x per day and neb with albuterol 2 x daily. He has  noticed some swelling in his left foot over the past wk.    Dyspnea:  No better/ 50 ft gives out (walks fast pace)  Cough: mild hacking Sleep: 30 degrees and 2-3 lpm  SABA use:   Already this am used prednisone 10 mg x 2 / symb x 2/ tudorza / saba x 3 pffs so far  rec Due  to your copd and respiratory failure I recommend you stay home through Wednesay the 22nd to use your 02 and multiple pulmonary medications around the clock as prescribed  See calendar for specific medication instructions and bring it back for each and every office visit for every healthcare provider you see.  Without it,  you may not receive the best quality medical care that we feel you deserve.    04/28/18  NP recs Check CXR and sputum culture Prednisone taper (4,4,4,3,3,3,2,2,2,); then continue as prescribed  Use neb treatments as needed for wheeze/cough  (do not duplicate with albuterol inhaler) Takes doxycyline per PCP, monitor how frequently you are using this. If taken regularly you could develop antibiotic resistance.  We advise that you stop smoking (do not smoke with  oxygen tank on) PLEASE bring in medications to next apt  Follow-up in 6 weeks      06/10/2018  f/u ov/[name] Bring re: post hosp f/u - did not bring meds as req / not using 02 as rec (uses p sob with activity) Chief Complaint  Patient presents with  . Follow-up    Breathing has improved some. He has d/ced alot of his meds and feels this has helped. He is using his neb 2 x daily and albuterol inhaler once daily.   Dyspnea:  MMRC3 = can't walk 100 yards even at a slow pace at a flat grade s stopping due to sob  Cough: clear mucus worse in am SABA use: puffer << neb (not the instructions given)  02:   2 lpm hs and daytime prn  Last pred x 4 days prior to OV   taken at bedtime rec Plan A = Automatic = symbicort 160 Take 2 puffs first thing in am and then another 2 puffs about 12 hours later along with tudorza one twice daily  Plan B = Backup Only use  your albuterol inhaler (Proair) as a rescue medication Plan C = Crisis - only use your albuterol nebulizer if you first try Plan B and it fails to help > ok to use the nebulizer up to every 4 hours but if start needing it regularly call for immediate appointment Use 02 to keep your saturations over 90%  > ok to adjust upwards if needed    06/24/18 NP eval/ recs: May use saline spray for nasal dryness/congestion continue current medications Medication calender updated Flu vaccine today Follow up in 1 month with Dr. [name] - bring all medications Please try to quit smoking     07/27/2018  f/u ov/[name] re: copd/ did not bring med calendar or all meds/ still smoking / prednisone 20 mg daily  Chief Complaint  Patient presents with  . Follow-up    States his breathing is about the same but when it is hot it gets much worse.   Dyspnea:  Across room even on 02 sob unless uses nebs / MMRC4  = sob if tries to leave home or while getting dressed   Cough: clear / rattling Sleeping: < 30 degrees from multiple pillows SABA use: way over using 02: 02 2lpm     No obvious day to day or daytime variability or assoc excess/ purulent sputum or mucus plugs or hemoptysis or cp or chest tightness, subjective wheeze or overt sinus or hb symptoms.   Sleeping as above  without nocturnal  or early am exacerbation  of respiratory  c/o's or need for noct saba. Also denies any obvious fluctuation of symptoms with weather or environmental changes or other aggravating or alleviating factors except as outlined above   No unusual exposure hx or h/o childhood pna/ asthma or knowledge of premature birth.  Current Allergies, Complete Past Medical History, Past Surgical History, Family History, and Social History were reviewed in [employer] record.  ROS  The following are not active complaints unless bolded Hoarseness, sore throat, dysphagia, dental problems, itching, sneezing,  nasal congestion  or discharge of excess mucus or purulent secretions, ear ache,   fever, chills, sweats, unintended wt loss or wt gain, classically pleuritic or exertional cp,  orthopnea pnd or arm/hand swelling  or leg swelling, presyncope, palpitations, abdominal pain, anorexia, nausea, vomiting, diarrhea  or change in bowel habits or change in bladder habits, change in stools or change in  urine, dysuria, hematuria,  rash, arthralgias, visual complaints, headache, numbness, weakness or ataxia or problems with walking or coordination,  change in mood or  memory.        Current Meds  Medication Sig  . albuterol (PROAIR HFA) 108 (90 Base) MCG/ACT inhaler INHALE TWO PUFFS UP TO EVERY 4 HOURS IF NEEDED FOR SHORT OF BREATH  . albuterol (PROVENTIL) (2.5 MG/3ML) 0.083% nebulizer solution Take 3 mLs (2.5 mg total) by nebulization every 4 (four) hours as needed for wheezing or shortness of breath.  Marland Kitchen amLODipine (NORVASC) 10 MG tablet Take 10 mg by mouth daily.   . benztropine (COGENTIN) 0.5 MG tablet Take 0.5 mg by mouth at bedtime.   . budesonide-formoterol (SYMBICORT) 160-4.5 MCG/ACT inhaler INHALE TWO PUFFS INTO THE LUNGS TWO (TWO) TIMES DAILY.  . cetirizine (ZYRTEC) 10 MG tablet Take 10 mg by mouth daily.  Marland Kitchen Dexlansoprazole 30 MG capsule Take 30 mg by mouth daily.  Marland Kitchen dextromethorphan-guaiFENesin (MUCINEX DM) 30-600 MG 12hr tablet Take 1 tablet by mouth 2 (two) times daily as needed (with fluter valve).  . docusate sodium (COLACE) 100 MG capsule Take 100 mg by mouth daily as needed.   . doxycycline (VIBRAMYCIN) 100 MG capsule Take 100 mg by mouth 2 (two) times daily. For 1 week as needed for yellow mucus w/ worsening breathing  . famotidine (PEPCID) 20 MG tablet Take 20 mg by mouth at bedtime.  . fluticasone (FLONASE) 50 MCG/ACT nasal spray Place 2 sprays into both nostrils daily as needed.   Marland Kitchen losartan (COZAAR) 100 MG tablet Take 100 mg by mouth daily.  Marland Kitchen loxapine (LOXITANE) 10 MG capsule Take 10 mg by mouth at  bedtime.   Marland Kitchen MAGNESIUM CITRATE PO every 14 (fourteen) days.   . magnesium oxide (MAG-OX) 400 (241.3 Mg) MG tablet Take 1 tablet (400 mg total) by mouth daily.  . Multiple Vitamin (MULTIVITAMIN) capsule Take 1 capsule by mouth daily.  . OXYGEN Inhale 2 L into the lungs as directed. 2-3 liters with sleep and exertion as needed for low oxygen  . predniSONE (DELTASONE) 10 MG tablet Take 1 tablet daily.  May increase to 2 tabs daily as needed until better for worsening breathing  . Probiotic Product (TRUBIOTICS) CAPS Take by mouth daily. Patient takes 1 capsule daily.  Marland Kitchen Respiratory Therapy Supplies (FLUTTER) DEVI Use as directed  . risperiDONE (RISPERDAL) 2 MG tablet Take 2 mg by mouth at bedtime.  . traMADol (ULTRAM) 50 MG tablet Take 50 mg by mouth every 12 (twelve) hours as needed.   .  Aclidinium Bromide (TUDORZA PRESSAIR) 400 MCG/ACT AEPB Inhale 1 puff into the lungs 2 (two) times daily.                            Objective:   Physical Exam  Hoarse amb anxious bm nad   06/15/2014  164  >  09/15/2014 158 >   02/20/2015 >156 03/22/2015 >  07/31/2015   150 > 12/05/2015  161   > 04/01/2016    151 > 07/29/2016  152 >   02/25/2017  153 >  06/05/2017  153 > 11/03/2017   166 > 01/30/2018  167> 03/16/2018  168 > 06/10/2018  167 > 07/27/2018  169  Vital signs reviewed - Note on arrival 02 sats  93% on 2lpm cont    HEENT: nl dentition / oropharynx. Nl external ear canals without cough reflex -  Mild bilateral non-specific turbinate edema  NECK :  without JVD/Nodes/TM/ nl carotid upstrokes bilaterally   LUNGS: no acc muscle use,  Mod barrel  contour chest wall with bilateral  Distant bs s audible wheeze and  without cough on insp or exp maneuver and mod  Hyperresonant  to  percussion bilaterally     CV:  RRR  no s3 or murmur or increase in P2, and no edema   ABD:  soft and nontender with pos mid insp Hoover's  in the supine position. No bruits or organomegaly appreciated, bowel sounds nl  MS:    Nl gait/  ext warm without deformities, calf tenderness, cyanosis or clubbing No obvious joint restrictions   SKIN: warm and dry without lesions    NEURO:  alert, approp, nl sensorium with  no motor or cerebellar deficits apparent.

## 2018-07-27 NOTE — Patient Instructions (Addendum)
For cough / congestion > mucinex up to 1200 mg every 12 hours and use the flutter valve as much as you can   Try off tudorza and start back on spiriva 2.5  X 2 pffs each am   Call if unable to get back into [facility] rehab    Please schedule a follow up office visit in 6 weeks, call sooner if needed with all medications /inhalers/ solutions in hand so we can verify exactly what you are taking. This includes all medications from all doctors and over the [place] separate them into two bags:  the ones you take automatically, no matter what, vs the ones you take just when you feel you need them "BAG #2 is UP TO YOU"  - this will really help [place] help you take your medications more effectively.     Marland Kitchen

## 2018-07-28 ENCOUNTER — Telehealth: Payer: Self-pay | Admitting: Internal Medicine

## 2018-07-28 NOTE — Telephone Encounter (Signed)
PA started 07/28/18 for Spiriva respimat 2.47mcg on [place] [facility],[phone].  Spoke with Quin at [facility], [place] ref #[id number]. Call ref # [id number].  PA pending  Will route to [place] to follow up

## 2018-07-29 ENCOUNTER — Encounter: Payer: Self-pay | Admitting: Internal Medicine

## 2018-07-29 NOTE — Assessment & Plan Note (Signed)
4-5 min discussion re active cigarette smoking in addition to office E&M  Ask about tobacco use:   ongoing Advise quitting   I took an extended  opportunity with this patient to outline the consequences of continued cigarette use  in airway disorders based on all the data we have from the multiple national lung health studies (perfomed over decades at millions of dollars in cost)  indicating that smoking cessation, not choice of inhalers or physicians, is the most important aspect of his care.   Assess willingness:  Not committed at this point Assist in quit attempt:  Per PCP when ready Arrange follow up:   Follow up per Primary Care planned        

## 2018-07-29 NOTE — Assessment & Plan Note (Signed)
-   started on 27 December 2013 by [facility] clinic - 02/14/2014  Walked RA x 3/4  laps @ 185 ft each stopped due to  desat 85% and corrected on 3lpm to x 2 laps  - 06/15/2014  Walked 2.5lpm  2 laps  @ 185 ft each stopped due to  Sob adequate sats  - 02/20/2015  Walked RA x 3 laps @ 185 ft each stopped due to  Sob/desat p 2 laps, nl pace, eliminated on his 02 at 3lpm  - 12/05/2015   Walked RA  2 laps @ 185 ft each stopped due to  desat to 88% and corrected on 2lpm  - 03/16/2018   Walked 3lmpm  x one lap @ 185 stopped due to /desat  To 89% at relatively fast pace  - 06/10/2018   Walked RA x one lap @ 185 stopped due to  Sob / desats to 86% resolved on repeating walk on 2lpm but still sob p one lap  - 07/27/2018  Walked 2lpm continuous x 2 laps @ 185 ft each stopped due to  End of study, nl pace, no desats but stopped due to sob   rec as of 07/27/2018  2lpm hs and prn daytime with goal of > 90% at all times   Advised pacing/ reconsider rehab    I had an extended discussion with the patient reviewing all relevant studies completed to date and  lasting 15 to 20 minutes of a 25 minute visit    See device teaching which extended face to face time for this visit.  Each maintenance medication was reviewed in detail including emphasizing most importantly the difference between maintenance and prns and under what circumstances the prns are to be triggered using an action plan format that is not reflected in the computer generated alphabetically organized AVS which I have not found useful in most complex patients, especially with respiratory illnesses  Please see AVS for specific instructions unique to this visit that I personally wrote and verbalized to the the pt in detail and then reviewed with pt  by my nurse highlighting any  changes in therapy recommended at today's visit to their plan of care.

## 2018-07-29 NOTE — Assessment & Plan Note (Addendum)
-   02/14/2014   try symbicort 160 2bid  - PFT's  02/14/2014   FEV1 1.82 (53%) and ratio 47 no better p alb (p spiriva and symbicort 160)  and DLCO 32 with 37%  07/2014 >Spiriva stopped  09/15/2014 Med calendar> not using 02/20/2015   - Declined rehab 07/31/15  - Quit smoking 11/2016  - flutter valve 04/01/2016  -med calendar 08/20/16  - added prn pred to action plan 12/05/2016  - changed to maint pred 02/25/2017 with floor of 10 mg daily >>>  - 06/05/2017  After extensive coaching device effectiveness =    90% smi > change tudorza to spiriva 2.5 x 2 > unable to afford - 11/03/2017  After extensive coaching inhaler device  effectiveness =    90% with DPI/ tudorza  - add pepcid 20 mg at hs 01/30/2018 for noct/ am wheeze - Spirometry 03/16/2018  FEV1 1.16 (34%)  Ratio 57 p am symb/ tudorza   - referred again to rehab 06/10/2018  And 07/27/2018    Overusing saba and clearly  Group D in terms of symptom/risk and laba/lama/ICS  therefore appropriate rx at this point so try back on spiriva 2.5 x 2 pff each am   - The proper method of use, as well as anticipated side effects, of a metered-dose inhaler are discussed and demonstrated to the patient. Improved effectiveness after extensive coaching during this visit to a level of approximately 90 % from a baseline of 75 % with respimat device     Formulary restrictions will be an ongoing challenge for the forseable future and I would be happy to pick an alternative if the pt will first  provide me a list of them but pt  will need to return here for training for any new device that is required eg dpi vs hfa vs respimat.    In meantime we can always provide samples so the patient never runs out of any needed respiratory medications.

## 2018-07-30 NOTE — Telephone Encounter (Signed)
Called [facility] [phone] spoke with Lovey Newcomer in Pharmacy PA dept She advised that the PA was denied due to pt not trying spiriva HandiHaler and duoneb neb meds. Advised Lovey Newcomer that pt did try both the above meds and failed them Advised her that the Spiriva Resp 2.5 works for the pt She approved the PA today over the phone, good for one year as of today's date.  PA #[id number]  ID# [id number]  Nothing further needed, PA has been approved.

## 2018-08-02 ENCOUNTER — Telehealth: Payer: Self-pay | Admitting: Pulmonary Disease

## 2018-08-02 MED ORDER — DOXYCYCLINE HYCLATE 100 MG PO CAPS: 100.0000 mg | ORAL_CAPSULE | Freq: Two times a day (BID) | ORAL | 0 refills | Status: AC

## 2018-08-02 NOTE — Telephone Encounter (Signed)
PCCM:  Calling to state that the pharmacy would not fill his albuterol inhaler because it has been too early and he has been using it a lot.  He denies respiratory complaints and does feel like he is breathing better than when we first spoke this morning.   I encouraged him to use his nebulizer if needed while at home. He is home most of the time and used his inhaler throughout the day instead of his nebs.   He needs a follow up appointment with an NP this week.  I also counseled him that these kind of questions can wait until regular business hours Monday through Friday.   CC: [facility] triage pool   Garner Nash, DO [facility] 08/02/2018 3:22 PM  Personal pager: [phone] If unanswered, please page CCM On-call: [phone]

## 2018-08-02 NOTE — Telephone Encounter (Signed)
PCCM:  Patient called weekend physician line   Called complaining of worsening SOB, cough and sputum production. Normally has a rescue pack with doxy in the past. Currently not available at his pharmacy to pick up.   I have reordered this. He has some prednisone at home I encouraged him to take 40mg  daily for the next 5 days and then start to taper.   I will try to schedule him an acute office visit for next week with an NP.   CC: [facility] Triage Pool  Garner Nash, DO [facility] 08/02/2018 9:06 AM

## 2018-08-03 NOTE — Telephone Encounter (Signed)
Spoke with pt. States that he has several appointments this week. Pt would like to come in next week. He has been scheduled on 08/12/18 at 11:30am with Tonya. Nothing further was needed.

## 2018-08-12 ENCOUNTER — Telehealth (HOSPITAL_COMMUNITY): Payer: Self-pay

## 2018-08-12 ENCOUNTER — Telehealth (HOSPITAL_COMMUNITY): Payer: Self-pay | Admitting: *Deleted

## 2018-08-12 ENCOUNTER — Encounter: Payer: Self-pay | Admitting: Nurse Practitioner

## 2018-08-12 ENCOUNTER — Ambulatory Visit (INDEPENDENT_AMBULATORY_CARE_PROVIDER_SITE_OTHER): Payer: Medicaid Other | Admitting: Nurse Practitioner

## 2018-08-12 VITALS — BP 136/70 | HR 83 | Ht 70.0 in | Wt 164.0 lb

## 2018-08-12 DIAGNOSIS — J9611 Chronic respiratory failure with hypoxia: Secondary | ICD-10-CM

## 2018-08-12 DIAGNOSIS — J449 Chronic obstructive pulmonary disease, unspecified: Secondary | ICD-10-CM

## 2018-08-12 MED ORDER — TIOTROPIUM BROMIDE MONOHYDRATE 2.5 MCG/ACT IN AERS: 2.0000 | INHALATION_SPRAY | Freq: Every day | RESPIRATORY_TRACT | 0 refills | Status: DC

## 2018-08-12 NOTE — Telephone Encounter (Signed)
Pt called inquiring about pulmonary rehab.  Pt stated that PR was discussed during his last appt.  We did not have a referral for pulmonary rehab for this patient. Pt informed that we would need a referral before we could move forward with scheduling pt.  Pt asked about insurance.  Pt only has Medicaid.  Pt informed that pulmonary rehab (undergraduate) is not covered by Medicaid.  Asked if pt had any other insurance, pt said no.  Advised pt that he would be eligible for the self pay pulmonary rehab program.  Pt given cost, name of PR maintenance coordinator and days/time this is offered.  Pt stated that he could afford and wished to proceed.  Pt is on his way to the office and will ask Dr. [name] to please place a pulmonary rehab maintenance order.  Once we receive the referral and review his medical record he will be contacted and scheduled.  Pt verbalized understanding and is in agreement of this. Cherre Huger, BSN Cardiac and Training and development officer

## 2018-08-12 NOTE — Progress Notes (Signed)
@Patient  ID: Tony Sullivan, male    DOB: 30-May-1961, 57 y.o.   MRN: [id number]  Chief Complaint  Patient presents with  . Follow-up    Referring provider: [name], FNP  HPI  57 year old male current smoker with COPD GOLD II and chronic respiratory failure with hypoxemia followed by Dr. [name].   Tests: PFT's  02/14/2014   FEV1 1.82 (53%) and ratio 47 no better p alb (p spiriva and symbicort 160)  and DLCO 32 with 37%  Spirometry 03/16/2018  FEV1 1.16 (34%)  Ratio 57 p am symb/ tudorza  OV 08/12/18 - acute shortness of breath Patient presents today for shortness of breath. He states symptoms started about 3 days ago. He states that he has ran out of his Spiriva. States that the Spiriva respimat was working well for him, but medicaid would not pay for the medication. He has tried [place] and it does not work - he cannot get a deep enough breath for medication to work. Has been without spiriva for 3 days. He denies any chest congestion, fever, or edema. He is on continuous O2. He is compliant with all other medications. States that he needs a referral for pulmonary rehab that was recommended at last visit by Dr. [name].      Allergies  Allergen Reactions  . Penicillins Anaphylaxis    Has patient had a PCN reaction causing immediate rash, facial/tongue/throat swelling, SOB or lightheadedness with hypotension: Yes Has patient had a PCN reaction causing severe rash involving mucus membranes or skin necrosis: No Has patient had a PCN reaction that required hospitalization No Has patient had a PCN reaction occurring within the last 10 years: No If all of the above answers are "NO", then may proceed with Cephalosporin use.   . Sulfa Antibiotics Swelling    Swelling of hands and throat   . Famotidine Other (See Comments)    Bloating of stomach, loss of appetite  . Levocetirizine Other (See Comments)    Muscle cramps    Immunization History  Administered Date(s) Administered  .  Influenza Split 08/09/2013, 08/03/2014  . Influenza Whole 07/28/2017  . Influenza,inj,Quad PF,6+ Mos 07/31/2015, 07/29/2016, 06/24/2018  . Pneumococcal Conjugate-13 07/31/2015  . Pneumococcal Polysaccharide-23 08/03/2014    Past Medical History:  Diagnosis Date  . Asthma   . COPD (chronic obstructive pulmonary disease) ([place]) 2004   diagnosed in 2004, no PFT's to date.  Started on home O2 12/2013, after found to be desatting at PCP's office, and referred to pulmonology.  . High blood pressure   . Seasonal allergies     Tobacco History: Social History   Tobacco Use  Smoking Status Current Some Day Smoker  . Packs/day: 0.50  . Years: 36.00  . Pack years: 18.00  . Types: Cigarettes  Smokeless Tobacco Never Used  Tobacco Comment   3-4 cigarettes/day 07/27/2018   Ready to quit: No Counseling given: Yes Comment: 3-4 cigarettes/day 07/27/2018   Outpatient Encounter Medications as of 08/12/2018  Medication Sig  . albuterol (PROAIR HFA) 108 (90 Base) MCG/ACT inhaler INHALE TWO PUFFS UP TO EVERY 4 HOURS IF NEEDED FOR SHORT OF BREATH  . albuterol (PROVENTIL) (2.5 MG/3ML) 0.083% nebulizer solution Take 3 mLs (2.5 mg total) by nebulization every 4 (four) hours as needed for wheezing or shortness of breath.  Marland Kitchen amLODipine (NORVASC) 10 MG tablet Take 10 mg by mouth daily.   . benztropine (COGENTIN) 0.5 MG tablet Take 0.5 mg by mouth at bedtime.   Marland Kitchen  budesonide-formoterol (SYMBICORT) 160-4.5 MCG/ACT inhaler INHALE TWO PUFFS INTO THE LUNGS TWO (TWO) TIMES DAILY.  . cetirizine (ZYRTEC) 10 MG tablet Take 10 mg by mouth daily.  Marland Kitchen Dexlansoprazole 30 MG capsule Take 30 mg by mouth daily.  Marland Kitchen dextromethorphan-guaiFENesin (MUCINEX DM) 30-600 MG 12hr tablet Take 1 tablet by mouth 2 (two) times daily as needed (with fluter valve).  . docusate sodium (COLACE) 100 MG capsule Take 100 mg by mouth daily as needed.   . famotidine (PEPCID) 20 MG tablet Take 20 mg by mouth at bedtime.  . fluticasone  (FLONASE) 50 MCG/ACT nasal spray Place 2 sprays into both nostrils daily as needed.   Marland Kitchen losartan (COZAAR) 100 MG tablet Take 100 mg by mouth daily.  Marland Kitchen loxapine (LOXITANE) 10 MG capsule Take 10 mg by mouth at bedtime.   Marland Kitchen MAGNESIUM CITRATE PO every 14 (fourteen) days.   . magnesium oxide (MAG-OX) 400 (241.3 Mg) MG tablet Take 1 tablet (400 mg total) by mouth daily.  . Multiple Vitamin (MULTIVITAMIN) capsule Take 1 capsule by mouth daily.  . OXYGEN Inhale 2 L into the lungs as directed. 2-3 liters with sleep and exertion as needed for low oxygen  . predniSONE (DELTASONE) 10 MG tablet Take 1 tablet daily.  May increase to 2 tabs daily as needed until better for worsening breathing  . Probiotic Product (TRUBIOTICS) CAPS Take by mouth daily. Patient takes 1 capsule daily.  Marland Kitchen Respiratory Therapy Supplies (FLUTTER) DEVI Use as directed  . risperiDONE (RISPERDAL) 2 MG tablet Take 2 mg by mouth at bedtime.  . Tiotropium Bromide Monohydrate (SPIRIVA RESPIMAT) 2.5 MCG/ACT AERS 2 pffs each  am  . traMADol (ULTRAM) 50 MG tablet Take 50 mg by mouth every 12 (twelve) hours as needed.   . Tiotropium Bromide Monohydrate (SPIRIVA RESPIMAT) 2.5 MCG/ACT AERS Inhale 2 puffs into the lungs daily.   No facility-administered encounter medications on file as of 08/12/2018.      Review of Systems  Review of Systems  Constitutional: Negative.  Negative for chills and fever.  HENT: Negative.  Negative for congestion.   Respiratory: Positive for shortness of breath. Negative for cough and wheezing.   Cardiovascular: Negative.  Negative for chest pain, palpitations and leg swelling.  Gastrointestinal: Negative.   Allergic/Immunologic: Negative.   Neurological: Negative.   Psychiatric/Behavioral: Negative.        Physical Exam  BP 136/70 (BP Location: Left Arm, Patient Position: Sitting, Cuff Size: Normal)   Pulse 83   Ht 5\' 10"  (1.778 m)   Wt 164 lb (74.4 kg)   SpO2 91%   BMI 23.53 kg/m   Wt Readings  from Last 5 Encounters:  08/12/18 164 lb (74.4 kg)  07/27/18 169 lb 6.4 oz (76.8 kg)  06/24/18 168 lb 6.4 oz (76.4 kg)  06/10/18 167 lb (75.8 kg)  05/25/18 166 lb (75.3 kg)     Physical Exam  Constitutional: He is oriented to person, place, and time. He appears well-developed and well-nourished. No distress.  Cardiovascular: Normal rate and regular rhythm.  Pulmonary/Chest: Effort normal and breath sounds normal. No respiratory distress. He has no wheezes. He has no rales.  Musculoskeletal: He exhibits no edema.  Neurological: He is alert and oriented to person, place, and time.  Skin: Skin is warm and dry.  Psychiatric: He has a normal mood and affect.  Nursing note and vitals reviewed.     Assessment & Plan:   COPD  GOLD II Patient Instructions  Will order pulmonary rehab  Will reorder Spiriva Patient has tried other inhalers and does not have the inspiratory capacity to inhale dry powder.  Continue Symbicort and albuterol Follow up with Dr. [name] in 1 month or sooner if needed    Chronic respiratory failure with hypoxia ([place]) 2lpm hs and prn daytime with goal of > 90% at all times      Fenton Foy, NP 08/12/2018

## 2018-08-12 NOTE — Patient Instructions (Addendum)
Will order pulmonary rehab Will reorder Spiriva Patient has tried other inhalers and does not have the inspiratory capacity to inhale dry powder.  Continue Symbicort and albuterol Follow up with Dr. [name] in 1 month or sooner if needed

## 2018-08-12 NOTE — Addendum Note (Signed)
Addended by: Nena Polio on: 08/12/2018 02:48 PM   Modules accepted: Orders

## 2018-08-12 NOTE — Assessment & Plan Note (Signed)
2lpm hs and prn daytime with goal of > 90% at all times

## 2018-08-12 NOTE — Assessment & Plan Note (Signed)
Patient Instructions  Will order pulmonary rehab Will reorder Spiriva Patient has tried other inhalers and does not have the inspiratory capacity to inhale dry powder.  Continue Symbicort and albuterol Follow up with Dr. [name] in 1 month or sooner if needed

## 2018-08-12 NOTE — Telephone Encounter (Signed)
Called and spoke with patient in regards to Pulmonary Rehab Maintenance - pt is interested.   Sent in basket to request Maintenance order. Closed order.

## 2018-08-13 ENCOUNTER — Encounter: Payer: Self-pay | Admitting: General Surgery

## 2018-08-13 NOTE — Addendum Note (Signed)
Addended by: Fenton Foy on: 08/13/2018 11:19 AM   Modules accepted: Orders

## 2018-08-16 ENCOUNTER — Telehealth: Payer: Self-pay | Admitting: Pulmonary Disease

## 2018-08-16 NOTE — Telephone Encounter (Signed)
Patient called with concerns about his medications  He was not sure whether he should use his Spiriva once or twice a day  I did reaffirm that he should use 2 puffs once a day  To call the office if he has any other concerns

## 2018-08-19 ENCOUNTER — Encounter (HOSPITAL_COMMUNITY): Payer: Self-pay | Admitting: *Deleted

## 2018-08-19 NOTE — Progress Notes (Signed)
I had discussed the Outpt. Pulmonary Maintenance program with Tony Sullivan back in March 2019. At that time he stated that he did not have transportation and felt that he was not going to be able to afford the monthly fee. He now states that he feels that he can afford the program and that he uses [employer] or [employer]. He wants to start the program the middle of November or the first of December. I have set up a November 12 th as a tentative start date. I will  arrange his transportation service. I also discussed with him hat he would need a 6 minute walk test on his first day. He was agreeable to this.

## 2018-08-23 ENCOUNTER — Telehealth: Payer: Self-pay | Admitting: Internal Medicine

## 2018-08-23 MED ORDER — AZITHROMYCIN 250 MG PO TABS: ORAL_TABLET | ORAL | 11 refills | Status: DC

## 2018-08-23 NOTE — Telephone Encounter (Signed)
Caught cold, doesn't know what he can take  rec zpak for nasty mucus, o/w just take the meds as instructed - I did this electronically   Needs f/u See Tammy NP w/in 2 weeks with all your medications, even over the counter meds, separated in two separate bags, the ones you take no matter what vs the ones you stop once you feel better and take only as needed when you feel you need them.   Tammy  will generate for you a new user friendly medication calendar that will put [place] all on the same page re: your medication use.     Without this process, it simply isn't possible to assure that we are providing  your outpatient care  with  the attention to detail we feel you deserve.   If we cannot assure that you're getting that kind of care,  then we cannot manage your problem effectively from this clinic.  Once you have seen Tammy and we are sure that we're all on the same page with your medication use she will arrange follow up with me.

## 2018-08-24 NOTE — Telephone Encounter (Signed)
LMTCB

## 2018-08-26 NOTE — Telephone Encounter (Signed)
LMTCB

## 2018-09-01 NOTE — Telephone Encounter (Signed)
lmtcb

## 2018-09-07 ENCOUNTER — Ambulatory Visit: Payer: Medicaid Other | Admitting: Internal Medicine

## 2018-09-08 NOTE — Telephone Encounter (Signed)
LMTCB and will close per protocol  

## 2018-09-09 ENCOUNTER — Encounter (HOSPITAL_COMMUNITY): Payer: Self-pay | Admitting: *Deleted

## 2018-09-09 NOTE — Progress Notes (Signed)
I [place] out that [place] that he will have to use [employer] for transportation to the Pulmonary Maintenance program. This is due to it being a self pay program and not billed through Medicaid. I have discussed this with Tony Sullivan and he states that he has attempted to apply for [employer], but that he had difficulty getting his PCP to fill out their part. I encouraged him to check to see where he was with his application since it has been a while. He said that he would and he get back in touch with me. I haven't heard from him. I have made 3 phone calls to him. I have been only able to leave one message and the last two calls his phone's message was to try my call later.

## 2018-09-14 ENCOUNTER — Telehealth (HOSPITAL_COMMUNITY): Payer: Self-pay

## 2018-09-14 NOTE — Telephone Encounter (Signed)
Pt called to follow up on [employer] with Lattie Haw, adv pt Lattie Haw was out of the office today. Pt stated he dropped his phone in water and will not have another phone until the 1st of month. Did not have another working number for Lattie Haw to return his phone call. Pt will try calling back tomorrow.

## 2018-09-18 ENCOUNTER — Ambulatory Visit (INDEPENDENT_AMBULATORY_CARE_PROVIDER_SITE_OTHER): Payer: Medicaid Other | Admitting: Internal Medicine

## 2018-09-18 ENCOUNTER — Encounter: Payer: Self-pay | Admitting: Internal Medicine

## 2018-09-18 DIAGNOSIS — F17210 Nicotine dependence, cigarettes, uncomplicated: Secondary | ICD-10-CM

## 2018-09-18 DIAGNOSIS — J9611 Chronic respiratory failure with hypoxia: Secondary | ICD-10-CM

## 2018-09-18 DIAGNOSIS — J449 Chronic obstructive pulmonary disease, unspecified: Secondary | ICD-10-CM | POA: Diagnosis not present

## 2018-09-18 MED ORDER — PREDNISONE 10 MG PO TABS: ORAL_TABLET | ORAL | 5 refills | Status: DC

## 2018-09-18 NOTE — Progress Notes (Signed)
Subjective:   Patient ID: Tony Sullivan, male    DOB: [birth year]  MRN: [id number]    Brief patient profile:  77  yobm active smokker with breathing difficulties since 2004 dx as copd on advair 250 /50 but getting worse since 2014 and placed on 02 for the first time in March 2015 and referred 01/07/2014 to pulmonary clinic by Dr Kennon Holter with GOLD II copd criteria established 02/14/14    History of Present Illness  01/07/2014 1st [facility] office visit/ Tony Sullivan  Chief Complaint  Patient presents with  . Advice Only    Referred for COPD, SOB.  Pt c/o SOB, low 02, prod cough with white mucous.  Pt forgot med list, unsure of what all he is taking  still can do a [employer] but uses [facility] parking, assoc with congested coughing esp in am worse in winter. rec Stop advair   Plan A =  Automatic =  symbicort 160 Take 2 puffs first thing in am and then another 2 puffs about 12 hours later. spiriva in am only  Plan B = Backup- Proventil use it only if you can't your breath - ok to use up to every 4 hours if needed Plan C = Nebulizer up to every 4 hours if needed only if you try plan B first and it doesn't work    01/30/2018  f/u ov/Nthony Sullivan re: COPD GOLD II / anxiety  Chief Complaint  Patient presents with  . Follow-up    increased SOB x 1week,productive cough light green in color  Dyspnea:  X 50 ft on 2lpm  Cough: more freq x one week/ no pattern> has oxy but hasn't started yet  Sleep: on 02 2lpm @ 30 degrees and worse wheeze in am when rising SABA use:  saba hfa = neb  Including x 2 in am of ov  rec Pepcid 20 mg one at bedtime  See calendar for specific medication instructions      03/16/2018  f/u ov/Tony Sullivan re: GOLD II/ worse sob/ no med calendar  Chief Complaint  Patient presents with  . Follow-up    Breathing has been progressively worse since last visit. He is noticing dizziness when he gets SOB.  He has been using his proair 4-5 x per day and neb with albuterol 2 x daily. He has  noticed some swelling in his left foot over the past wk.    Dyspnea:  No better/ 50 ft gives out (walks fast pace)  Cough: mild hacking Sleep: 30 degrees and 2-3 lpm  SABA use:   Already this am used prednisone 10 mg x 2 / symb x 2/ tudorza / saba x 3 pffs so far  rec Due  to your copd and respiratory failure I recommend you stay home through Wednesay the 22nd to use your 02 and multiple pulmonary medications around the clock as prescribed  See calendar for specific medication instructions and bring it back for each and every office visit for every healthcare provider you see.  Without it,  you may not receive the best quality medical care that we feel you deserve.    04/28/18  NP recs Check CXR and sputum culture Prednisone taper (4,4,4,3,3,3,2,2,2,); then continue as prescribed  Use neb treatments as needed for wheeze/cough  (do not duplicate with albuterol inhaler) Takes doxycyline per PCP, monitor how frequently you are using this. If taken regularly you could develop antibiotic resistance.  We advise that you stop smoking (do not smoke with  oxygen tank on) PLEASE bring in medications to next apt  Follow-up in 6 weeks      06/10/2018  f/u ov/Tony Sullivan re: post hosp f/u - did not bring meds as req / not using 02 as rec (uses p sob with activity) Chief Complaint  Patient presents with  . Follow-up    Breathing has improved some. He has d/ced alot of his meds and feels this has helped. He is using his neb 2 x daily and albuterol inhaler once daily.   Dyspnea:  MMRC3 = can't walk 100 yards even at a slow pace at a flat grade s stopping due to sob  Cough: clear mucus worse in am SABA use: puffer << neb (not the instructions given)  02:   2 lpm hs and daytime prn  Last pred x 4 days prior to OV   taken at bedtime rec Plan A = Automatic = symbicort 160 Take 2 puffs first thing in am and then another 2 puffs about 12 hours later along with tudorza one twice daily  Plan B = Backup Only use  your albuterol inhaler (Proair) as a rescue medication Plan C = Crisis - only use your albuterol nebulizer if you first try Plan B and it fails to help > ok to use the nebulizer up to every 4 hours but if start needing it regularly call for immediate appointment Use 02 to keep your saturations over 90%  > ok to adjust upwards if needed    06/24/18 NP eval/ recs: May use saline spray for nasal dryness/congestion continue current medications Medication calender updated Flu vaccine today Follow up in 1 month with Dr. [name] - bring all medications Please try to quit smoking     07/27/2018  f/u ov/Tony Sullivan re: copd/ did not bring med calendar or all meds/ still smoking / prednisone 20 mg daily  Chief Complaint  Patient presents with  . Follow-up    States his breathing is about the same but when it is hot it gets much worse.   Dyspnea:  Across room even on 02 sob unless uses nebs / MMRC4  = sob if tries to leave home or while getting dressed   Cough: clear / rattling Sleeping: < 30 degrees from multiple pillows SABA use: way over using 02: 02 2lpm   rec For cough / congestion > mucinex up to 1200 mg every 12 hours and use the flutter valve as much as you can Try off tudorza and start back on spiriva 2.5  X 2 pffs each am  Call if unable to get back into [facility] rehab  Please schedule a follow up office visit in 6 weeks, call sooner if needed with all medications /inhalers/ solutions in hand   08/12/18 ov NP rec Will order pulmonary rehab Will reorder Spiriva Patient has tried other inhalers and does not have the inspiratory capacity to inhale dry powder.  Continue Symbicort and albuterol    09/18/2018  f/u ov/Tony Sullivan re:  GOLD III copd / prednisone 20 mg / did not bring med cal as req maint on symb 160/spiriva smi Chief Complaint  Patient presents with  . Follow-up    Breathing is doing well. He is using his albuterol inhaler 4-5 per day because he states he has been walking more.  He uses his neb 3 to 4 x per wk.    Dyspnea:  Can't do [employer] s stopping on 1lpm but doesn't check sats or titrate up  Cough: esp  in am x  first hour but mostly white and < a tbsp or two produced  Sleeping: ok flat  SABA use: as above 02: sleeping on 1.5- 2lpm and never above 1lpm walking    No obvious day to day or daytime variability or assoc  purulent sputum or mucus plugs or hemoptysis or cp or chest tightness, subjective wheeze or overt sinus or hb symptoms.   Sleeping as above without nocturnal  or early am exacerbation  of respiratory  c/o's or need for noct saba. Also denies any obvious fluctuation of symptoms with weather or environmental changes or other aggravating or alleviating factors except as outlined above   No unusual exposure hx or h/o childhood pna/ asthma or knowledge of premature birth.  Current Allergies, Complete Past Medical History, Past Surgical History, Family History, and Social History were reviewed in [employer] record.  ROS  The following are not active complaints unless bolded Hoarseness, sore throat, dysphagia, dental problems, itching, sneezing,  nasal congestion or discharge of excess mucus or purulent secretions, ear ache,   fever, chills, sweats, unintended wt loss or wt gain, classically pleuritic or exertional cp,  orthopnea pnd or arm/hand swelling  or leg swelling, presyncope, palpitations, abdominal pain, anorexia, nausea, vomiting, diarrhea  or change in bowel habits or change in bladder habits, change in stools or change in urine, dysuria, hematuria,  rash, arthralgias, visual complaints, headache, numbness, weakness or ataxia or problems with walking or coordination,  change in mood or  memory.        Current Meds  Medication Sig  . albuterol (PROAIR HFA) 108 (90 Base) MCG/ACT inhaler INHALE TWO PUFFS UP TO EVERY 4 HOURS IF NEEDED FOR SHORT OF BREATH  . albuterol (PROVENTIL) (2.5 MG/3ML) 0.083% nebulizer solution Take 3  mLs (2.5 mg total) by nebulization every 4 (four) hours as needed for wheezing or shortness of breath.  Marland Kitchen amLODipine (NORVASC) 10 MG tablet Take 10 mg by mouth daily.   . benztropine (COGENTIN) 0.5 MG tablet Take 0.5 mg by mouth at bedtime.   . budesonide-formoterol (SYMBICORT) 160-4.5 MCG/ACT inhaler INHALE TWO PUFFS INTO THE LUNGS TWO (TWO) TIMES DAILY.  . cetirizine (ZYRTEC) 10 MG tablet Take 10 mg by mouth daily.  Marland Kitchen Dexlansoprazole 30 MG capsule Take 30 mg by mouth daily.  Marland Kitchen dextromethorphan-guaiFENesin (MUCINEX DM) 30-600 MG 12hr tablet Take 1 tablet by mouth 2 (two) times daily as needed (with fluter valve).  . docusate sodium (COLACE) 100 MG capsule Take 100 mg by mouth daily as needed.   . famotidine (PEPCID) 20 MG tablet Take 20 mg by mouth at bedtime.  . fluticasone (FLONASE) 50 MCG/ACT nasal spray Place 2 sprays into both nostrils daily as needed.   Marland Kitchen losartan (COZAAR) 100 MG tablet Take 100 mg by mouth daily.  Marland Kitchen loxapine (LOXITANE) 10 MG capsule Take 10 mg by mouth at bedtime.   . Multiple Vitamin (MULTIVITAMIN) capsule Take 1 capsule by mouth daily.  . OXYGEN Inhale 2 L into the lungs as directed. 2-3 liters with sleep and exertion as needed for low oxygen  . predniSONE (DELTASONE) 10 MG tablet Take 1 tablet daily.  May increase to 2 tabs daily as needed until better for worsening breathing  . Probiotic Product (TRUBIOTICS) CAPS Take by mouth daily. Patient takes 1 capsule daily.  Marland Kitchen Respiratory Therapy Supplies (FLUTTER) DEVI Use as directed  . risperiDONE (RISPERDAL) 2 MG tablet Take 2 mg by mouth at bedtime.  . Tiotropium Bromide  Monohydrate (SPIRIVA RESPIMAT) 2.5 MCG/ACT AERS 2 pffs each  am  . traMADol (ULTRAM) 50 MG tablet Take 50 mg by mouth every 12 (twelve) hours as needed.   . [DISCONTINUED] predniSONE (DELTASONE) 10 MG tablet Take 1 tablet daily.  May increase to 2 tabs daily as needed until better for worsening breathing                   Objective:   Physical  Exam   anxious amb bm nad   06/15/2014  164  >  09/15/2014 158 >   02/20/2015 >156 03/22/2015 >  07/31/2015   150 > 12/05/2015  161   > 04/01/2016    151 > 07/29/2016  152 >   02/25/2017  153 >  06/05/2017  153 > 11/03/2017   166 > 01/30/2018  167> 03/16/2018  168 > 06/10/2018  167 > 07/27/2018  169 > 09/18/2018 163   Vital signs reviewed - Note on arrival 02 sats  96% on RA         HEENT: nl dentition / oropharynx. Nl external ear canals without cough reflex -  Mild bilateral non-specific turbinate edema     NECK :  without JVD/Nodes/TM/ nl carotid upstrokes bilaterally   LUNGS: no acc muscle use,  Mod barrel  contour chest wall with bilateral  Distant bs s audible wheeze and  without cough on insp or exp maneuver and mod  Hyperresonant  to  percussion bilaterally     CV:  RRR  no s3 or murmur or increase in P2, and no edema   ABD:  soft and nontender with pos mid insp Hoover's  in the supine position. No bruits or organomegaly appreciated, bowel sounds nl  MS:   Nl gait/  ext warm without deformities, calf tenderness, cyanosis or clubbing No obvious joint restrictions   SKIN: warm and dry without lesions    NEURO:  alert, approp, nl sensorium with  no motor or cerebellar deficits apparent.

## 2018-09-18 NOTE — Patient Instructions (Addendum)
Try prednisone 10 mg daily to see if you do just as well and if flares then double the dose until better  Only use your albuterol as a rescue medication to be used if you can't catch your breath by resting or doing a relaxed purse lip breathing pattern.  - The less you use it, the better it will work when you need it. - Ok to use up to 2 puffs  every 4 hours if you must but call for immediate appointment if use goes up over your usual need - Don't leave home without it !!  (think of it like the spare tire for your car)    Please use flutter valve in am to help with the morning congestion    Please schedule a follow up visit in 3 months but call sooner if needed - bring your medications and your med calendar

## 2018-09-19 ENCOUNTER — Encounter: Payer: Self-pay | Admitting: Internal Medicine

## 2018-09-19 NOTE — Assessment & Plan Note (Signed)
4-5 min discussion re active cigarette smoking in addition to office E&M  Ask about tobacco use:   Ongoing though he minimizes amt and impact on health Advise quitting     Discussed the risks and costs (both direct and indirect)  of smoking relative to the benefits of quitting emphasizing he will see the most immediate benefit in terms of cough severity  Assess willingness:  Not committed at this point Assist in quit attempt:  Per PCP when ready Arrange follow up:   Follow up per Primary Care planned

## 2018-09-19 NOTE — Assessment & Plan Note (Signed)
-   02/14/2014   try symbicort 160 2bid  - PFT's  02/14/2014   FEV1 1.82 (53%) and ratio 47 no better p alb (p spiriva and symbicort 160)  and DLCO 32 with 37%  07/2014 >Spiriva stopped  09/15/2014 Med calendar> not using 02/20/2015   - Declined rehab 07/31/15  - Quit smoking 11/2016  - flutter valve 04/01/2016  -med calendar 08/20/16  - added prn pred to action plan 12/05/2016  - changed to maint pred 02/25/2017 with floor of 10 mg daily >>>  - 06/05/2017  After extensive coaching device effectiveness =    90% smi > change tudorza to spiriva 2.5 x 2 > unable to afford - 11/03/2017  After extensive coaching inhaler device  effectiveness =    90% with DPI/ tudorza  - add pepcid 20 mg at hs 01/30/2018 for noct/ am wheeze - Spirometry 03/16/2018  FEV1 1.16 (34%)  Ratio 57 p am symb/ tudorza - referred again to rehab 06/10/2018  And 07/27/2018  > declined 09/18/2018  - 07/27/2018  After extensive coaching inhaler device,  effectiveness =    90% with respimat    Group D in terms of symptom/risk and laba/lama/ICS  therefore appropriate rx at this point so on approp maint rx at present but still over using saba daily    I spent extra time with pt today reviewing appropriate use of albuterol for prn use on exertion with the following points: 1) saba is for relief of sob that does not improve by walking a slower pace or resting but rather if the pt does not improve after trying this first. 2) If the pt is convinced, as many are, that saba helps recover from activity faster then it's easy to tell if this is the case by re-challenging : ie stop, take the inhaler, then p 5 minutes try the exact same activity (intensity of workload) that just caused the symptoms and see if they are substantially diminished or not after saba 3) if there is an activity that reproducibly causes the symptoms, try the saba 15 min before the activity on alternate days   If in fact the saba really does help, then fine to continue to use it prn but  advised may need to look closer at the maintenance regimen being used to achieve better control of airways disease with exertion.    Needs to reconsider rehab  Reviewed approp use of mucienex products and flutter, esp first thing in am.

## 2018-09-30 ENCOUNTER — Encounter (HOSPITAL_COMMUNITY): Payer: Self-pay

## 2018-09-30 ENCOUNTER — Other Ambulatory Visit: Payer: Self-pay

## 2018-09-30 ENCOUNTER — Inpatient Hospital Stay (HOSPITAL_COMMUNITY)
Admission: EM | Admit: 2018-09-30 | Discharge: 2018-10-02 | DRG: 190 | Disposition: A | Discharge status: Home Health | Payer: Medicaid Other | Attending: Internal Medicine | Admitting: Internal Medicine

## 2018-09-30 ENCOUNTER — Emergency Department (HOSPITAL_COMMUNITY): Payer: Medicaid Other

## 2018-09-30 DIAGNOSIS — J9621 Acute and chronic respiratory failure with hypoxia: Secondary | ICD-10-CM | POA: Diagnosis present

## 2018-09-30 DIAGNOSIS — Z825 Family history of asthma and other chronic lower respiratory diseases: Secondary | ICD-10-CM

## 2018-09-30 DIAGNOSIS — I10 Essential (primary) hypertension: Secondary | ICD-10-CM | POA: Diagnosis present

## 2018-09-30 DIAGNOSIS — F17210 Nicotine dependence, cigarettes, uncomplicated: Secondary | ICD-10-CM | POA: Diagnosis present

## 2018-09-30 DIAGNOSIS — Z888 Allergy status to other drugs, medicaments and biological substances status: Secondary | ICD-10-CM

## 2018-09-30 DIAGNOSIS — Z882 Allergy status to sulfonamides status: Secondary | ICD-10-CM

## 2018-09-30 DIAGNOSIS — Z880 Allergy status to penicillin: Secondary | ICD-10-CM

## 2018-09-30 DIAGNOSIS — J441 Chronic obstructive pulmonary disease with (acute) exacerbation: Principal | ICD-10-CM | POA: Diagnosis present

## 2018-09-30 DIAGNOSIS — J9622 Acute and chronic respiratory failure with hypercapnia: Secondary | ICD-10-CM | POA: Diagnosis present

## 2018-09-30 DIAGNOSIS — Z9981 Dependence on supplemental oxygen: Secondary | ICD-10-CM

## 2018-09-30 DIAGNOSIS — Z7951 Long term (current) use of inhaled steroids: Secondary | ICD-10-CM

## 2018-09-30 DIAGNOSIS — F1010 Alcohol abuse, uncomplicated: Secondary | ICD-10-CM | POA: Diagnosis present

## 2018-09-30 DIAGNOSIS — F29 Unspecified psychosis not due to a substance or known physiological condition: Secondary | ICD-10-CM | POA: Diagnosis present

## 2018-09-30 DIAGNOSIS — J9601 Acute respiratory failure with hypoxia: Secondary | ICD-10-CM

## 2018-09-30 DIAGNOSIS — Z79899 Other long term (current) drug therapy: Secondary | ICD-10-CM

## 2018-09-30 DIAGNOSIS — Z7952 Long term (current) use of systemic steroids: Secondary | ICD-10-CM

## 2018-09-30 HISTORY — DX: Nicotine dependence, unspecified, uncomplicated: F17.200

## 2018-09-30 LAB — COMPREHENSIVE METABOLIC PANEL
ALT: 26 U/L (ref 0–44)
AST: 29 U/L (ref 15–41)
Albumin: 3.6 g/dL (ref 3.5–5.0)
Alkaline Phosphatase: 75 U/L (ref 38–126)
Anion gap: 14 (ref 5–15)
BUN: 13 mg/dL (ref 6–20)
CO2: 22 mmol/L (ref 22–32)
Calcium: 9.1 mg/dL (ref 8.9–10.3)
Chloride: 101 mmol/L (ref 98–111)
Creatinine, Ser: 1.04 mg/dL (ref 0.61–1.24)
Glucose, Bld: 90 mg/dL (ref 70–99)
Potassium: 3.6 mmol/L (ref 3.5–5.1)
Sodium: 137 mmol/L (ref 135–145)
Total Bilirubin: 1.4 mg/dL — ABNORMAL HIGH (ref 0.3–1.2)
Total Protein: 7.1 g/dL (ref 6.5–8.1)

## 2018-09-30 LAB — CBC WITH DIFFERENTIAL/PLATELET
Abs Immature Granulocytes: 0.19 10*3/uL — ABNORMAL HIGH (ref 0.00–0.07)
Basophils Absolute: 0.1 10*3/uL (ref 0.0–0.1)
Basophils Relative: 1 %
Eosinophils Absolute: 0.1 10*3/uL (ref 0.0–0.5)
Eosinophils Relative: 0 %
HCT: 57.6 % — ABNORMAL HIGH (ref 39.0–52.0)
Hemoglobin: 18.5 g/dL — ABNORMAL HIGH (ref 13.0–17.0)
Immature Granulocytes: 1 %
Lymphocytes Relative: 26 %
Lymphs Abs: 4.1 10*3/uL — ABNORMAL HIGH (ref 0.7–4.0)
MCH: 32.3 pg (ref 26.0–34.0)
MCHC: 32.1 g/dL (ref 30.0–36.0)
MCV: 100.5 fL — ABNORMAL HIGH (ref 80.0–100.0)
Monocytes Absolute: 1.1 10*3/uL — ABNORMAL HIGH (ref 0.1–1.0)
Monocytes Relative: 7 %
Neutro Abs: 9.9 10*3/uL — ABNORMAL HIGH (ref 1.7–7.7)
Neutrophils Relative %: 65 %
Platelets: 231 10*3/uL (ref 150–400)
RBC: 5.73 MIL/uL (ref 4.22–5.81)
RDW: 14.7 % (ref 11.5–15.5)
WBC: 15.4 10*3/uL — ABNORMAL HIGH (ref 4.0–10.5)
nRBC: 0 % (ref 0.0–0.2)

## 2018-09-30 LAB — INFLUENZA PANEL BY PCR (TYPE A & B)
Influenza A By PCR: NEGATIVE
Influenza B By PCR: NEGATIVE

## 2018-09-30 LAB — I-STAT VENOUS BLOOD GAS, ED
Acid-base deficit: 2 mmol/L (ref 0.0–2.0)
Bicarbonate: 24.9 mmol/L (ref 20.0–28.0)
O2 Saturation: 38 %
TCO2: 26 mmol/L (ref 22–32)
pCO2, Ven: 47.4 mmHg (ref 44.0–60.0)
pH, Ven: 7.329 (ref 7.250–7.430)
pO2, Ven: 24 mmHg — CL (ref 32.0–45.0)

## 2018-09-30 LAB — MRSA PCR SCREENING: MRSA by PCR: NEGATIVE

## 2018-09-30 LAB — I-STAT TROPONIN, ED: Troponin i, poc: 0.02 ng/mL (ref 0.00–0.08)

## 2018-09-30 IMAGING — DX DG CHEST 1V PORT
1 series · 2 of 2 positions shown · non-contrast
Comparison: [DATE].

CLINICAL DATA: Shortness of breath, COPD, asthma.

EXAM:
PORTABLE CHEST 1 VIEW

[Series 1: chest · 0.14mm/px · 2 of 2 slices shown]
[im 1/2]
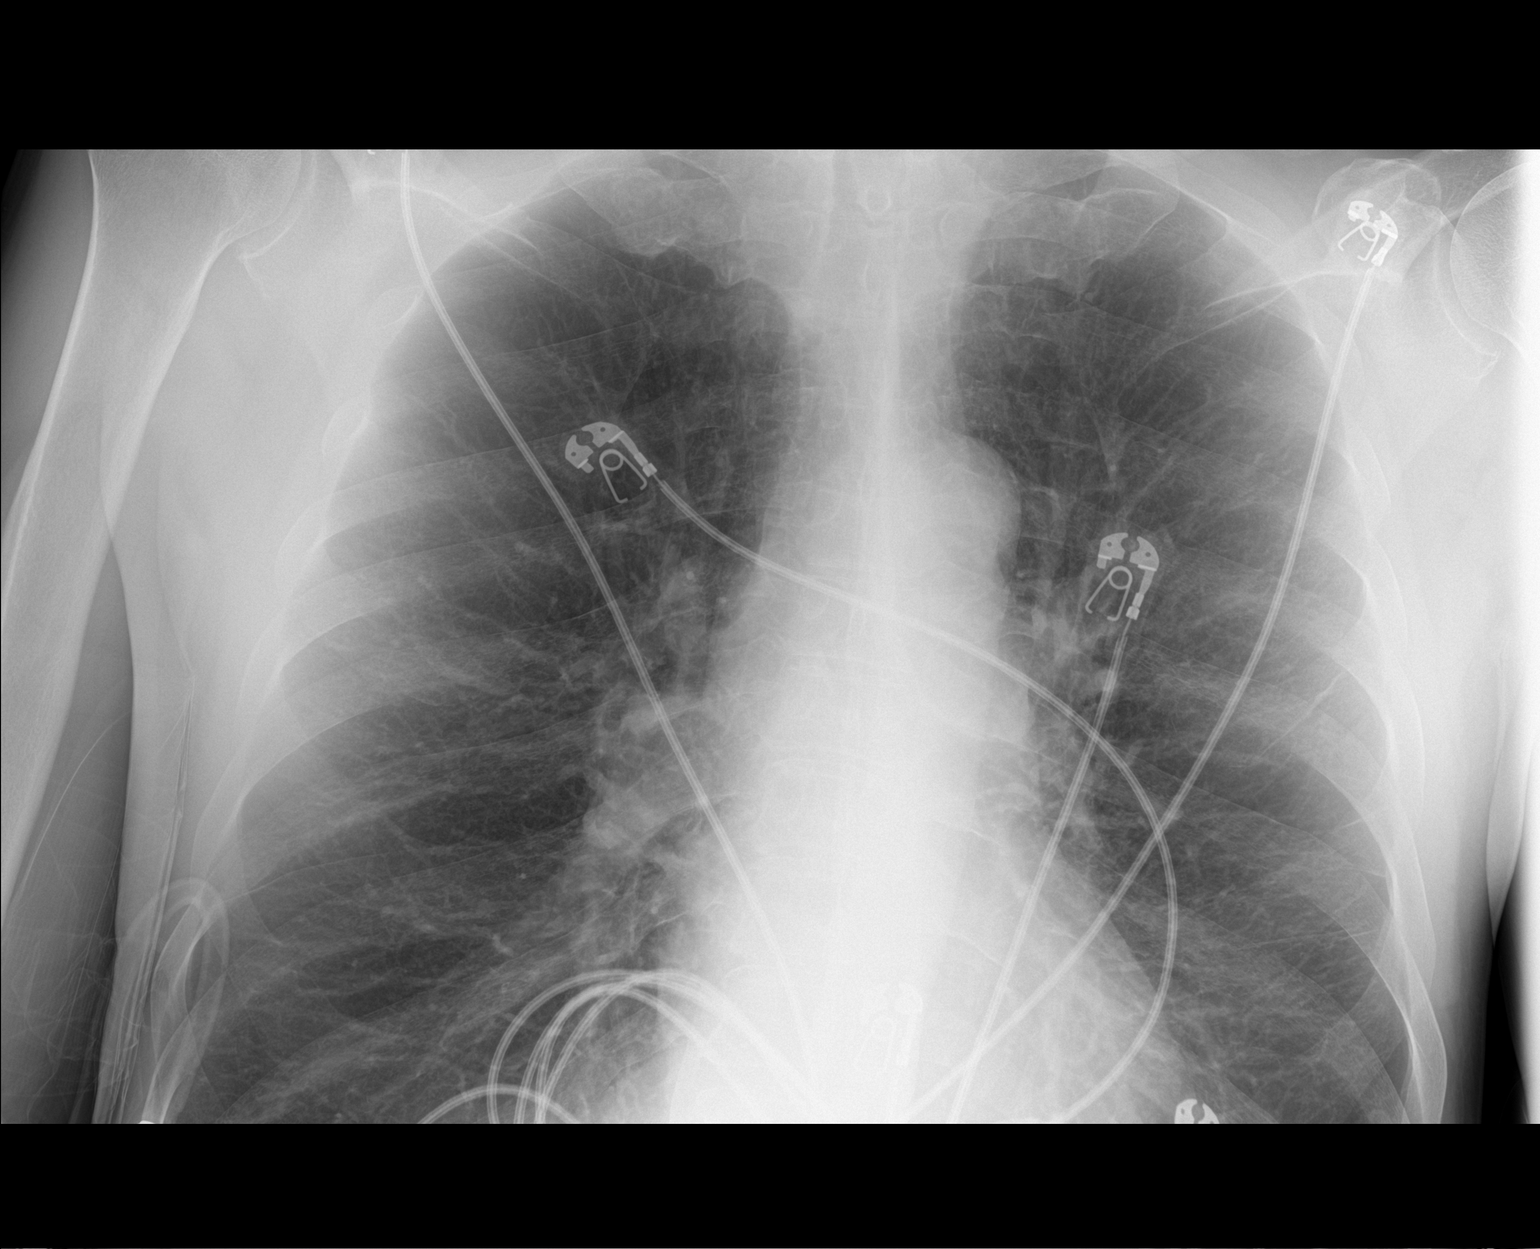
[im 2/2]
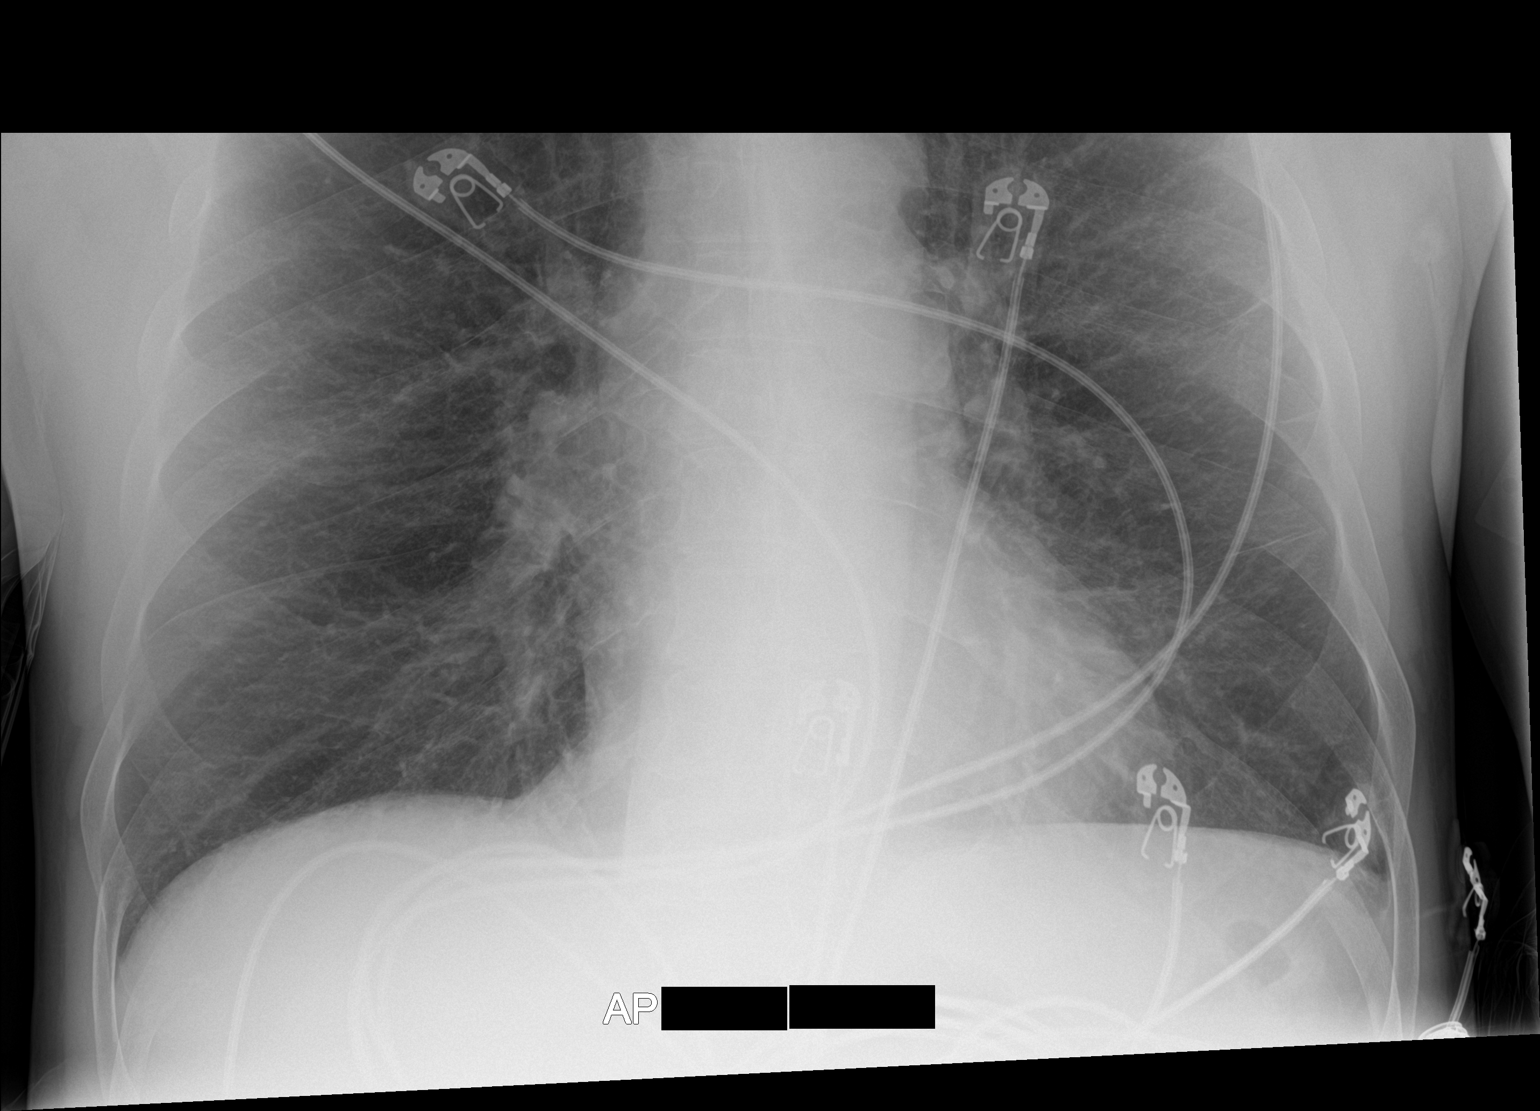

[2 of 2 positions shown; findings below may reference images not displayed]

FINDINGS: The heart size and mediastinal contours are within normal limits.
Both lungs are clear. The visualized skeletal structures are
unremarkable. Mild hyperaeration.
IMPRESSION: No active disease.  Stable appearance from priors.

## 2018-09-30 MED ORDER — FLUTICASONE PROPIONATE 50 MCG/ACT NA SUSP
2.0000 | Freq: Three times a day (TID) | NASAL | Status: DC | PRN
  Filled 2018-09-30: qty 16

## 2018-09-30 MED ORDER — METHYLPREDNISOLONE SODIUM SUCC 125 MG IJ SOLR
125.0000 mg | Freq: Once | INTRAMUSCULAR | Status: AC
  Administered 2018-09-30: 125 mg via INTRAVENOUS
  Filled 2018-09-30: qty 2

## 2018-09-30 MED ORDER — ALBUTEROL SULFATE (2.5 MG/3ML) 0.083% IN NEBU: 2.5000 mg | INHALATION_SOLUTION | RESPIRATORY_TRACT | Status: DC | PRN

## 2018-09-30 MED ORDER — BENZTROPINE MESYLATE 1 MG PO TABS
0.5000 mg | ORAL_TABLET | Freq: Every day | ORAL | Status: DC
  Filled 2018-09-30 (×2): qty 1

## 2018-09-30 MED ORDER — IPRATROPIUM BROMIDE 0.02 % IN SOLN
1.0000 mg | Freq: Once | RESPIRATORY_TRACT | Status: AC
  Administered 2018-09-30: 1 mg via RESPIRATORY_TRACT
  Filled 2018-09-30: qty 5

## 2018-09-30 MED ORDER — MOMETASONE FURO-FORMOTEROL FUM 200-5 MCG/ACT IN AERO
2.0000 | INHALATION_SPRAY | Freq: Two times a day (BID) | RESPIRATORY_TRACT | Status: DC
  Administered 2018-09-30 – 2018-10-02 (×4): 2 via RESPIRATORY_TRACT
  Filled 2018-09-30: qty 8.8

## 2018-09-30 MED ORDER — NICOTINE 14 MG/24HR TD PT24
14.0000 mg | MEDICATED_PATCH | Freq: Every day | TRANSDERMAL | Status: DC
  Administered 2018-09-30 – 2018-10-02 (×3): 14 mg via TRANSDERMAL
  Filled 2018-09-30 (×3): qty 1

## 2018-09-30 MED ORDER — AMLODIPINE BESYLATE 5 MG PO TABS
10.0000 mg | ORAL_TABLET | Freq: Every day | ORAL | Status: DC
  Administered 2018-09-30 – 2018-10-02 (×3): 10 mg via ORAL
  Filled 2018-09-30 (×3): qty 2

## 2018-09-30 MED ORDER — SODIUM CHLORIDE 0.9 % IV SOLN
100.0000 mg | Freq: Two times a day (BID) | INTRAVENOUS | Status: DC
  Administered 2018-09-30 – 2018-10-01 (×2): 100 mg via INTRAVENOUS
  Filled 2018-09-30 (×3): qty 100

## 2018-09-30 MED ORDER — MIRTAZAPINE 30 MG PO TABS
30.0000 mg | ORAL_TABLET | Freq: Every day | ORAL | Status: DC
  Filled 2018-09-30: qty 2
  Filled 2018-09-30 (×2): qty 1

## 2018-09-30 MED ORDER — SODIUM CHLORIDE 0.9% FLUSH
3.0000 mL | Freq: Two times a day (BID) | INTRAVENOUS | Status: DC
  Administered 2018-09-30 – 2018-10-02 (×5): 3 mL via INTRAVENOUS

## 2018-09-30 MED ORDER — LORATADINE 10 MG PO TABS
10.0000 mg | ORAL_TABLET | Freq: Every day | ORAL | Status: DC
  Administered 2018-09-30 – 2018-10-02 (×3): 10 mg via ORAL
  Filled 2018-09-30 (×3): qty 1

## 2018-09-30 MED ORDER — UMECLIDINIUM BROMIDE 62.5 MCG/INH IN AEPB
1.0000 | INHALATION_SPRAY | Freq: Every day | RESPIRATORY_TRACT | Status: DC
  Administered 2018-10-01 – 2018-10-02 (×2): 1 via RESPIRATORY_TRACT
  Filled 2018-09-30: qty 7

## 2018-09-30 MED ORDER — LOSARTAN POTASSIUM 50 MG PO TABS
100.0000 mg | ORAL_TABLET | Freq: Every day | ORAL | Status: DC
  Administered 2018-09-30 – 2018-10-02 (×3): 100 mg via ORAL
  Filled 2018-09-30 (×3): qty 2

## 2018-09-30 MED ORDER — PREDNISONE 20 MG PO TABS: 40.0000 mg | ORAL_TABLET | Freq: Every day | ORAL | Status: DC

## 2018-09-30 MED ORDER — LOXAPINE SUCCINATE 5 MG PO CAPS
10.0000 mg | ORAL_CAPSULE | Freq: Every day | ORAL | Status: DC
  Filled 2018-09-30 (×2): qty 2

## 2018-09-30 MED ORDER — RISPERIDONE 2 MG PO TABS
2.0000 mg | ORAL_TABLET | Freq: Every day | ORAL | Status: DC
  Filled 2018-09-30 (×2): qty 1

## 2018-09-30 MED ORDER — ENOXAPARIN SODIUM 40 MG/0.4ML ~~LOC~~ SOLN
40.0000 mg | SUBCUTANEOUS | Status: DC
  Administered 2018-09-30 – 2018-10-01 (×2): 40 mg via SUBCUTANEOUS
  Filled 2018-09-30 (×3): qty 0.4

## 2018-09-30 MED ORDER — METHYLPREDNISOLONE SODIUM SUCC 125 MG IJ SOLR
60.0000 mg | Freq: Four times a day (QID) | INTRAMUSCULAR | Status: DC
  Administered 2018-09-30 – 2018-10-01 (×3): 60 mg via INTRAVENOUS
  Filled 2018-09-30 (×3): qty 2

## 2018-09-30 MED ORDER — IPRATROPIUM-ALBUTEROL 0.5-2.5 (3) MG/3ML IN SOLN
3.0000 mL | Freq: Four times a day (QID) | RESPIRATORY_TRACT | Status: DC
  Administered 2018-09-30 – 2018-10-02 (×8): 3 mL via RESPIRATORY_TRACT
  Filled 2018-09-30 (×7): qty 3

## 2018-09-30 MED ORDER — ALBUTEROL (5 MG/ML) CONTINUOUS INHALATION SOLN
10.0000 mg/h | INHALATION_SOLUTION | RESPIRATORY_TRACT | Status: DC
  Administered 2018-09-30: 10 mg/h via RESPIRATORY_TRACT
  Filled 2018-09-30: qty 20

## 2018-09-30 NOTE — H&P (Signed)
History and Physical    Tony Sullivan IEP:[id number] DOB: 1961/05/31 DOA: 09/30/2018  PCP: [name], FNP Consultants:  [name] - pulmonology; Hinda Glatter - ENT Patient coming from:  Home - lives alone; NOK: Nobody  Chief Complaint: SOB  HPI: Tony Sullivan is a 57 y.o. male with medical history significant of HTN and COPD on nocturnal and prn O2 presenting with SOB.  Patient presented with SOB - it got worse after he saw Dr. [name] on 11/22, maybe around the end of the month.  Cough, productive of mostly clear sputum but increased in volume.  He doesn't use drugs but was drinking the other day with some guys he doesn't usually drink with an he got some jittery he worried they gave him drugs.  Last seen by Dr. [name] on 11/22.  COPD gold II, recommended to reconsider pulmonary rehab.  Ongoing efforts at smoking cessation encouraged.  ED Course:  COPD exacerbation, smoker.  Some viral symptoms, ran out of medications.  Placed on BIPAP.  Labs ok, blood gas ok.  Given neb, prednisone, much more comfortable.  Flu in process.  Review of Systems: As per HPI; otherwise review of systems reviewed and negative.   Ambulatory Status:  Ambulates with a cane  Past Medical History:  Diagnosis Date  . COPD (chronic obstructive pulmonary disease) ([place]) 2004   diagnosed in 2004, no PFT's to date.  Started on home O2 12/2013, after found to be desatting at PCP's office, and referred to pulmonology.  . High blood pressure   . Seasonal allergies   . Tobacco dependence     Past Surgical History:  Procedure Laterality Date  . APPENDECTOMY  1980  . COLONOSCOPY WITH PROPOFOL N/A 12/10/2016   Procedure: COLONOSCOPY WITH PROPOFOL;  Surgeon: [name], MD;  Location: [facility];  Service: Endoscopy;  Laterality: N/A;  . HIP SURGERY Right   . SHOULDER ARTHROSCOPY Right     Social History   Socioeconomic History  . Marital status: Single    Spouse name: Not on file  . Number of children: Not  on file  . Years of education: Not on file  . Highest education level: Not on file  Occupational History  . Occupation: disabled  Social Needs  . Financial resource strain: Not on file  . Food insecurity:    Worry: Not on file    Inability: Not on file  . Transportation needs:    Medical: Not on file    Non-medical: Not on file  Tobacco Use  . Smoking status: Current Every Day Smoker    Packs/day: 0.25    Years: 36.00    Pack years: 9.00    Types: Cigarettes  . Smokeless tobacco: Never Used  . Tobacco comment: 3-4 cigarettes/day 07/27/2018  Substance and Sexual Activity  . Alcohol use: Yes    Alcohol/week: 14.0 - 21.0 standard drinks    Types: 14 - 21 Standard drinks or equivalent per week    Comment: 2-3 beers/day, with occasional "binges".  No history of withdrawal  . Drug use: Yes    Types: Marijuana    Comment: daily  . Sexual activity: Not on file  Lifestyle  . Physical activity:    Days per week: Not on file    Minutes per session: Not on file  . Stress: Not on file  Relationships  . Social connections:    Talks on phone: Not on file    Gets together: Not on file  Attends religious service: Not on file    Active member of club or organization: Not on file    Attends meetings of clubs or organizations: Not on file    Relationship status: Not on file  . Intimate partner violence:    Fear of current or ex partner: Not on file    Emotionally abused: Not on file    Physically abused: Not on file    Forced sexual activity: Not on file  Other Topics Concern  . Not on file  Social History Narrative  . Not on file    Allergies  Allergen Reactions  . Penicillins Anaphylaxis    Has patient had a PCN reaction causing immediate rash, facial/tongue/throat swelling, SOB or lightheadedness with hypotension: Yes Has patient had a PCN reaction causing severe rash involving mucus membranes or skin necrosis: No Has patient had a PCN reaction that required  hospitalization No Has patient had a PCN reaction occurring within the last 10 years: No If all of the above answers are "NO", then may proceed with Cephalosporin use.   . Sulfa Antibiotics Swelling    Swelling of hands and throat   . Famotidine Other (See Comments)    Bloating of stomach, loss of appetite  . Levocetirizine Other (See Comments)    Muscle cramps    Family History  Problem Relation Age of Onset  . Emphysema Mother   . Allergies Mother        "everyone in family"  . Asthma Mother        "everyone in family"  . Heart disease Mother   . Clotting disorder Mother   . Cancer Mother   . Emphysema Father   . Allergies Father   . Allergies Son   . Heart disease Maternal Aunt     Prior to Admission medications   Medication Sig Start Date End Date Taking? Authorizing Provider  albuterol (PROAIR HFA) 108 (90 Base) MCG/ACT inhaler INHALE TWO PUFFS UP TO EVERY 4 HOURS IF NEEDED FOR SHORT OF BREATH 05/04/18   [name], MD  albuterol (PROVENTIL) (2.5 MG/3ML) 0.083% nebulizer solution Take 3 mLs (2.5 mg total) by nebulization every 4 (four) hours as needed for wheezing or shortness of breath. 04/28/18   [name], NP  amLODipine (NORVASC) 10 MG tablet Take 10 mg by mouth daily.     [provider]  benztropine (COGENTIN) 0.5 MG tablet Take 0.5 mg by mouth at bedtime.     [provider]  budesonide-formoterol (SYMBICORT) 160-4.5 MCG/ACT inhaler INHALE TWO PUFFS INTO THE LUNGS TWO (TWO) TIMES DAILY. 04/28/18   [name], NP  cetirizine (ZYRTEC) 10 MG tablet Take 10 mg by mouth daily.    [provider]  Dexlansoprazole 30 MG capsule Take 30 mg by mouth daily.    [provider]  dextromethorphan-guaiFENesin (MUCINEX DM) 30-600 MG 12hr tablet Take 1 tablet by mouth 2 (two) times daily as needed (with fluter valve).    [provider]  docusate sodium (COLACE) 100 MG capsule Take 100 mg by mouth daily as needed.      [provider]  famotidine (PEPCID) 20 MG tablet Take 20 mg by mouth at bedtime.    [provider]  fluticasone (FLONASE) 50 MCG/ACT nasal spray Place 2 sprays into both nostrils daily as needed.     [provider]  losartan (COZAAR) 100 MG tablet Take 100 mg by mouth daily.    [provider]  loxapine (  LOXITANE) 10 MG capsule Take 10 mg by mouth at bedtime.     [provider]  Multiple Vitamin (MULTIVITAMIN) capsule Take 1 capsule by mouth daily.    [provider]  OXYGEN Inhale 2 L into the lungs as directed. 2-3 liters with sleep and exertion as needed for low oxygen    [provider]  predniSONE (DELTASONE) 10 MG tablet Take 1 tablet daily.  May increase to 2 tabs daily as needed until better for worsening breathing 09/18/18   [name], MD  Probiotic Product (TRUBIOTICS) CAPS Take by mouth daily. Patient takes 1 capsule daily.    [provider]  Respiratory Therapy Supplies (FLUTTER) DEVI Use as directed 04/01/16   [name], MD  risperiDONE (RISPERDAL) 2 MG tablet Take 2 mg by mouth at bedtime.    [provider]  Tiotropium Bromide Monohydrate (SPIRIVA RESPIMAT) 2.5 MCG/ACT AERS 2 pffs each  am 07/27/18   [name], MD  traMADol (ULTRAM) 50 MG tablet Take 50 mg by mouth every 12 (twelve) hours as needed.     [provider]    Physical Exam: Vitals:   09/30/18 1345 09/30/18 1400 09/30/18 1445 09/30/18 1500  BP: 113/79 110/81 104/76 103/75  Pulse: 96 96 92 92  Resp: 18 17 18 18   Temp:      TempSrc:      SpO2: 96% 96% 95% 95%  Weight:      Height:         General:  Appears calm and comfortable and is NAD Eyes:  PERRL, EOMI, normal lids, iris ENT:  grossly normal hearing, on BIPAP Neck:  no LAD, masses or thyromegaly Cardiovascular:  RRR, no m/r/g. No LE edema.  Respiratory:   CTA bilaterally with no wheezes/rales/rhonchi.  Normal respiratory effort on  BIPAP. Abdomen:  soft, NT, ND, NABS Skin:  no rash or induration seen on limited exam Musculoskeletal:  grossly normal tone BUE/BLE, good ROM, no bony abnormality Psychiatric:  blunted mood and affect, speech fluent and appropriate but limited by BIPAP, AOx3 Neurologic:  CN 2-12 grossly intact, moves all extremities in coordinated fashion, sensation intact    Radiological Exams on Admission: Dg Chest Port 1 View  Result Date: 09/30/2018 CLINICAL DATA:  Shortness of breath, COPD, asthma. EXAM: PORTABLE CHEST 1 VIEW COMPARISON:  05/23/2018. FINDINGS: The heart size and mediastinal contours are within normal limits. Both lungs are clear. The visualized skeletal structures are unremarkable. Mild hyperaeration. IMPRESSION: No active disease.  Stable appearance from priors. Electronically Signed   By: Staci Righter M.D.   On: 09/30/2018 10:12    EKG: Independently reviewed.  Sinus tachycardia with rate 108; LAFB; nonspecific ST changes with no evidence of acute ischemia   Labs on Admission: I have personally reviewed the available labs and imaging studies at the time of the admission.  Pertinent labs:   VBG: 7.329/47.4 Unremarkable CMP WBC 15.4 Hgb 18.5 Troponin 0.02  Assessment/Plan Principal Problem:   Acute on chronic respiratory failure with hypoxia (HCC) Active Problems:   COPD with acute exacerbation (HCC)   Essential hypertension   ETOH abuse   Cigarette smoker   Acute on chronic respiratory failure with hypoxia associated with COPD exacerbation -Patient's shortness of breath and productive cough are most likely caused by acute COPD exacerbation.  -He has history of O2-dependent COPD for which he uses occasional home O2 -He does not have fever but does have leukocytosis.  -Chest x-ray is not consistent with  pneumonia -He was started on BIPAP in the ED with some improvement.   -Will continue BIPAP prn for now, with O2 otherwise -Nebulizers: scheduled Duoneb and prn  albuterol -Solu-Medrol 60 mg IV BID -> daily prednisone -IV Doxycycline (has 2/3 cardinal symptoms) - can transition to PO once off BIPAP -Continue home meds - Incruse Elliptsa, Dulera  HTN -Continue Norvasc, Cozaar  ETOH abuse -Patient with ETOH abuse -Mild daily use, binge drinking on weekends -He is at mildly high risk for complications of withdrawal including seizures, DTs -Will observe -CIWA protocol not ordered but can be added if needed  Tobacco dependence -Tobacco Dependence: encourage cessation.   -This was discussed with the patient and should be reviewed on an ongoing basis.   -Patch ordered at patient request.  Other -He has unspecified psychiatric illness, apparently psychotic in nature based on medications -Continue home meds - Cogentin, Loxapine, Remeron, Risperdal   DVT prophylaxis: Lovenox  Code Status:  Full - confirmed with patient Family Communication: None present Disposition Plan:  Home once clinically improved Consults called: CM/Nutrition/RT  Admission status: It is my clinical opinion that referral for OBSERVATION is reasonable and necessary in this patient based on the above information provided. The aforementioned taken together are felt to place the patient at high risk for further clinical deterioration. However it is anticipated that the patient may be medically stable for discharge from the hospital within 24 to 48 hours.      Karmen Bongo MD [facility]  If note is complete, please contact covering daytime or nighttime physician. [email or web address] Password [email or web address]  09/30/2018, 3:17 PM

## 2018-09-30 NOTE — Plan of Care (Signed)
  Problem: Activity: Goal: Risk for activity intolerance will decrease Outcome: Progressing   

## 2018-09-30 NOTE — ED Provider Notes (Signed)
[facility] EMERGENCY DEPARTMENT Provider Note   CSN: [id number] Arrival date & time: 09/30/18  8466     History   Chief Complaint Chief Complaint  Patient presents with  . Chest Pain  . Shortness of Breath    HPI Tony Sullivan is a 57 y.o. male.  The history is provided by the patient.  Shortness of Breath  This is a recurrent problem. The problem occurs continuously.The current episode started yesterday. The problem has been gradually worsening. Associated symptoms include cough, wheezing and chest pain. Pertinent negatives include no fever, no headaches, no rhinorrhea, no sore throat, no swollen glands, no ear pain, no sputum production, no vomiting, no abdominal pain, no rash, no leg pain, no leg swelling and no claudication. Risk factors include smoking. He has tried nothing (ran out of breathing medications ) for the symptoms. The treatment provided no relief. Associated medical issues include COPD.    Past Medical History:  Diagnosis Date  . Asthma   . COPD (chronic obstructive pulmonary disease) ([place]) 2004   diagnosed in 2004, no PFT's to date.  Started on home O2 12/2013, after found to be desatting at PCP's office, and referred to pulmonology.  . High blood pressure   . Seasonal allergies     Patient Active Problem List   Diagnosis Date Noted  . Abdominal pain   . Poor dentition 11/04/2017  . Acute on chronic respiratory failure with hypoxia ([place]) 08/29/2017  . COPD exacerbation ([place]) 08/29/2017  . ETOH abuse 03/30/2017  . Cigarette smoker 03/30/2017  . Acute respiratory failure with hypoxia and hypercapnia ([place]) 03/29/2017  . Special screening for malignant neoplasms, colon   . Benign neoplasm of ascending colon   . Benign neoplasm of descending colon   . Hoarseness 08/22/2016  . GERD (gastroesophageal reflux disease) 08/22/2016  . Atypical chest pain 12/05/2015  . Essential hypertension 06/15/2014  . Pectoralis muscle strain 01/11/2014    . COPD with acute exacerbation ([place]) 01/10/2014  . COPD  GOLD II 01/08/2014  . Smoker 01/08/2014  . Cough 01/08/2014  . Chronic respiratory failure with hypoxia ([place]) 01/08/2014    Past Surgical History:  Procedure Laterality Date  . APPENDECTOMY  1980  . COLONOSCOPY WITH PROPOFOL N/A 12/10/2016   Procedure: COLONOSCOPY WITH PROPOFOL;  Surgeon: [name], MD;  Location: [facility];  Service: Endoscopy;  Laterality: N/A;  . HIP SURGERY Right   . SHOULDER ARTHROSCOPY Right         Home Medications    Prior to Admission medications   Medication Sig Start Date End Date Taking? Authorizing Provider  albuterol (PROAIR HFA) 108 (90 Base) MCG/ACT inhaler INHALE TWO PUFFS UP TO EVERY 4 HOURS IF NEEDED FOR SHORT OF BREATH 05/04/18   [name], MD  albuterol (PROVENTIL) (2.5 MG/3ML) 0.083% nebulizer solution Take 3 mLs (2.5 mg total) by nebulization every 4 (four) hours as needed for wheezing or shortness of breath. 04/28/18   [name], NP  amLODipine (NORVASC) 10 MG tablet Take 10 mg by mouth daily.     [provider]  benztropine (COGENTIN) 0.5 MG tablet Take 0.5 mg by mouth at bedtime.     [provider]  budesonide-formoterol (SYMBICORT) 160-4.5 MCG/ACT inhaler INHALE TWO PUFFS INTO THE LUNGS TWO (TWO) TIMES DAILY. 04/28/18   [name], NP  cetirizine (ZYRTEC) 10 MG tablet Take 10 mg by mouth daily.    [provider]  Dexlansoprazole 30 MG capsule Take  30 mg by mouth daily.    [provider]  dextromethorphan-guaiFENesin (MUCINEX DM) 30-600 MG 12hr tablet Take 1 tablet by mouth 2 (two) times daily as needed (with fluter valve).    [provider]  docusate sodium (COLACE) 100 MG capsule Take 100 mg by mouth daily as needed.     [provider]  famotidine (PEPCID) 20 MG tablet Take 20 mg by mouth at bedtime.    [provider]  fluticasone (FLONASE) 50 MCG/ACT nasal spray Place 2 sprays into both  nostrils daily as needed.     [provider]  losartan (COZAAR) 100 MG tablet Take 100 mg by mouth daily.    [provider]  loxapine (LOXITANE) 10 MG capsule Take 10 mg by mouth at bedtime.     [provider]  Multiple Vitamin (MULTIVITAMIN) capsule Take 1 capsule by mouth daily.    [provider]  OXYGEN Inhale 2 L into the lungs as directed. 2-3 liters with sleep and exertion as needed for low oxygen    [provider]  predniSONE (DELTASONE) 10 MG tablet Take 1 tablet daily.  May increase to 2 tabs daily as needed until better for worsening breathing 09/18/18   [name], MD  Probiotic Product (TRUBIOTICS) CAPS Take by mouth daily. Patient takes 1 capsule daily.    [provider]  Respiratory Therapy Supplies (FLUTTER) DEVI Use as directed 04/01/16   [name], MD  risperiDONE (RISPERDAL) 2 MG tablet Take 2 mg by mouth at bedtime.    [provider]  Tiotropium Bromide Monohydrate (SPIRIVA RESPIMAT) 2.5 MCG/ACT AERS 2 pffs each  am 07/27/18   [name], MD  traMADol (ULTRAM) 50 MG tablet Take 50 mg by mouth every 12 (twelve) hours as needed.     [provider]    Family History Family History  Problem Relation Age of Onset  . Emphysema Mother   . Allergies Mother        "everyone in family"  . Asthma Mother        "everyone in family"  . Heart disease Mother   . Clotting disorder Mother   . Cancer Mother   . Emphysema Father   . Allergies Father   . Allergies Son   . Heart disease Maternal Aunt     Social History Social History   Tobacco Use  . Smoking status: Current Some Day Smoker    Packs/day: 0.50    Years: 36.00    Pack years: 18.00    Types: Cigarettes  . Smokeless tobacco: Never Used  . Tobacco comment: 3-4 cigarettes/day 07/27/2018  Substance Use Topics  . Alcohol use: Yes    Alcohol/week: 14.0 - 21.0 standard drinks    Types: 14 - 21 Standard drinks or equivalent per  week    Comment: 2-3 beers/day, with occasional "binges".  No history of withdrawal  . Drug use: No     Allergies   Penicillins; Sulfa antibiotics; Famotidine; and Levocetirizine   Review of Systems Review of Systems  Constitutional: Negative for chills and fever.  HENT: Negative for ear pain, rhinorrhea and sore throat.   Eyes: Negative for pain and visual disturbance.  Respiratory: Positive for cough, chest tightness, shortness of breath and wheezing. Negative for sputum production.   Cardiovascular: Positive for chest pain. Negative for palpitations, claudication and leg swelling.  Gastrointestinal: Negative for abdominal pain and vomiting.  Genitourinary: Negative for dysuria and hematuria.  Musculoskeletal:  Negative for arthralgias and back pain.  Skin: Negative for color change and rash.  Neurological: Negative for seizures, syncope and headaches.  All other systems reviewed and are negative.    Physical Exam Updated Vital Signs  ED Triage Vitals  Enc Vitals Group     BP 09/30/18 0926 (!) 151/93     Pulse Rate 09/30/18 0926 (!) 110     Resp 09/30/18 0926 (!) 32     Temp 09/30/18 0926 99.4 F (37.4 C)     Temp Source 09/30/18 0926 Oral     SpO2 09/30/18 0926 94 %     Weight 09/30/18 0919 163 lb (73.9 kg)     Height 09/30/18 0919 5\' 10"  (1.778 m)     Head Circumference --      Peak Flow --      Pain Score 09/30/18 0919 10     Pain Loc --      Pain Edu? --      Excl. in [place]? --      Physical Exam  Constitutional: He is oriented to person, place, and time. He appears well-developed and well-nourished.  HENT:  Head: Normocephalic and atraumatic.  Eyes: Pupils are equal, round, and reactive to light. Conjunctivae and EOM are normal.  Neck: Normal range of motion. Neck supple.  Cardiovascular: Normal rate, regular rhythm, intact distal pulses and normal pulses.  No murmur heard. Pulmonary/Chest: Accessory muscle usage present. Tachypnea noted. No respiratory  distress. He has decreased breath sounds. He has wheezes.  Lip pursing on exam on 2L of oxygen   Abdominal: Soft. There is no tenderness.  Musculoskeletal: He exhibits no edema.       Right lower leg: He exhibits no edema.       Left lower leg: He exhibits no edema.  Neurological: He is alert and oriented to person, place, and time.  Skin: Skin is warm and dry.  Psychiatric: He has a normal mood and affect.  Nursing note and vitals reviewed.    ED Treatments / Results  Labs (all labs ordered are listed, but only abnormal results are displayed) Labs Reviewed  CBC WITH DIFFERENTIAL/PLATELET - Abnormal; Notable for the following components:      Result Value   WBC 15.4 (*)    Hemoglobin 18.5 (*)    HCT 57.6 (*)    MCV 100.5 (*)    Neutro Abs 9.9 (*)    Lymphs Abs 4.1 (*)    Monocytes Absolute 1.1 (*)    Abs Immature Granulocytes 0.19 (*)    All other components within normal limits  COMPREHENSIVE METABOLIC PANEL - Abnormal; Notable for the following components:   Total Bilirubin 1.4 (*)    All other components within normal limits  I-STAT VENOUS BLOOD GAS, ED - Abnormal; Notable for the following components:   pO2, Ven 24.0 (*)    All other components within normal limits  INFLUENZA PANEL BY PCR (TYPE A & B)  I-STAT TROPONIN, ED    EKG EKG Interpretation  Date/Time:  Wednesday September 30 2018 09:23:32 EST Ventricular Rate:  108 PR Interval:    QRS Duration: 71 QT Interval:  315 QTC Calculation: 423 R Axis:   -89 Text Interpretation:  Poor data quality Sinus tachycardia Biatrial enlargement Left anterior fascicular block Confirmed by Lennice Sites [phone]) on 09/30/2018 9:28:25 AM   Radiology Dg Chest Port 1 View  Result Date: 09/30/2018 CLINICAL DATA:  Shortness of breath, COPD, asthma. EXAM: PORTABLE CHEST 1 VIEW COMPARISON:  05/23/2018. FINDINGS: The heart size and mediastinal contours are within normal limits. Both lungs are clear. The visualized skeletal  structures are unremarkable. Mild hyperaeration. IMPRESSION: No active disease.  Stable appearance from priors. Electronically Signed   By: Staci Righter M.D.   On: 09/30/2018 10:12    Procedures .Critical Care Performed by: Lennice Sites, DO Authorized by: Lennice Sites, DO   Critical care provider statement:    Critical care time (minutes):  35   Critical care was necessary to treat or prevent imminent or life-threatening deterioration of the following conditions:  Respiratory failure   Critical care was time spent personally by me on the following activities:  Development of treatment plan with patient or surrogate, discussions with primary provider, evaluation of patient's response to treatment, examination of patient, ordering and performing treatments and interventions, ordering and review of laboratory studies, ordering and review of radiographic studies, pulse oximetry, re-evaluation of patient's condition and review of old charts   I assumed direction of critical care for this patient from another provider in my specialty: no     (including critical care time)  Medications Ordered in ED Medications  albuterol (PROVENTIL,VENTOLIN) solution continuous neb (0 mg/hr Nebulization Stopped 09/30/18 1127)  methylPREDNISolone sodium succinate (SOLU-MEDROL) 125 mg/2 mL injection 125 mg (125 mg Intravenous Given 09/30/18 0932)  ipratropium (ATROVENT) nebulizer solution 1 mg (1 mg Nebulization Given 09/30/18 0952)     Initial Impression / Assessment and Plan / ED Course  I have reviewed the triage vital signs and the nursing notes.  Pertinent labs & imaging results that were available during my care of the patient were reviewed by me and considered in my medical decision making (see chart for details).     Tony Sullivan is a 57 year old male with history of COPD who presents to the ED with shortness of breath.  Patient with overall unremarkable vitals upon arrival.  Patient with mild  hypoxia on 2 L of oxygen.  Patient with signs of respiratory distress upon my initial evaluation with tachypnea, lip pursing and was placed on BiPAP for further respiratory support.  Patient states that he has not had his breathing medications as he ran out.  Complains of viral symptoms with body aches, cough but no sputum production.  Patient with wheezing, poor air movement on exam.  Concerning for COPD exacerbation.  No known history of heart failure.  No signs of volume overload on exam.  EKG shows no ischemic changes.  Troponin within normal limits.  Doubt cardiac process.  Patient given IV methylprednisone, continuous albuterol breathing treatment and Atrovent with improvement.  Blood gas showed normal pH and no significant hypercarbia.  Patient feels better after breathing treatment and on BiPAP.  Lab work showed leukocytosis but otherwise no significant anemia, electrolyte abnormality, kidney injury.  Chest x-ray showed no signs of pneumonia, pneumothorax, pleural effusion.  Flu swab obtained as possible viral process exacerbating COPD today.  Patient to be admitted to hospitalist service for further care.  Hemodynamically stable throughout my care.  This chart was dictated using voice recognition software.  Despite best efforts to proofread,  errors can occur which can change the documentation meaning.   Final Clinical Impressions(s) / ED Diagnoses   Final diagnoses:  COPD exacerbation ([place])  Acute respiratory failure with hypoxia [facility])    ED Discharge Orders    None       Lennice Sites, DO 09/30/18 1211

## 2018-09-30 NOTE — ED Triage Notes (Signed)
Pt c/o CP radiating to left arm and sob hat started around 3am Today. Pt also he has been dealing with a productive cough x 10 years

## 2018-10-01 DIAGNOSIS — Z882 Allergy status to sulfonamides status: Secondary | ICD-10-CM | POA: Diagnosis not present

## 2018-10-01 DIAGNOSIS — Z79899 Other long term (current) drug therapy: Secondary | ICD-10-CM | POA: Diagnosis not present

## 2018-10-01 DIAGNOSIS — Z7951 Long term (current) use of inhaled steroids: Secondary | ICD-10-CM | POA: Diagnosis not present

## 2018-10-01 DIAGNOSIS — F1010 Alcohol abuse, uncomplicated: Secondary | ICD-10-CM | POA: Diagnosis present

## 2018-10-01 DIAGNOSIS — J9621 Acute and chronic respiratory failure with hypoxia: Secondary | ICD-10-CM | POA: Diagnosis present

## 2018-10-01 DIAGNOSIS — F29 Unspecified psychosis not due to a substance or known physiological condition: Secondary | ICD-10-CM | POA: Diagnosis present

## 2018-10-01 DIAGNOSIS — I10 Essential (primary) hypertension: Secondary | ICD-10-CM | POA: Diagnosis present

## 2018-10-01 DIAGNOSIS — Z880 Allergy status to penicillin: Secondary | ICD-10-CM | POA: Diagnosis not present

## 2018-10-01 DIAGNOSIS — J441 Chronic obstructive pulmonary disease with (acute) exacerbation: Secondary | ICD-10-CM | POA: Diagnosis not present

## 2018-10-01 DIAGNOSIS — Z9981 Dependence on supplemental oxygen: Secondary | ICD-10-CM | POA: Diagnosis not present

## 2018-10-01 DIAGNOSIS — F17210 Nicotine dependence, cigarettes, uncomplicated: Secondary | ICD-10-CM | POA: Diagnosis present

## 2018-10-01 DIAGNOSIS — Z7952 Long term (current) use of systemic steroids: Secondary | ICD-10-CM | POA: Diagnosis not present

## 2018-10-01 DIAGNOSIS — Z825 Family history of asthma and other chronic lower respiratory diseases: Secondary | ICD-10-CM | POA: Diagnosis not present

## 2018-10-01 DIAGNOSIS — Z888 Allergy status to other drugs, medicaments and biological substances status: Secondary | ICD-10-CM | POA: Diagnosis not present

## 2018-10-01 LAB — TROPONIN I
Troponin I: <0.03 ng/mL (ref <0.03)
Troponin I: 0.03 ng/mL (ref <0.03)
Troponin I: <0.03 ng/mL (ref <0.03)

## 2018-10-01 LAB — BASIC METABOLIC PANEL
Anion gap: 10 (ref 5–15)
BUN: 13 mg/dL (ref 6–20)
CO2: 23 mmol/L (ref 22–32)
Calcium: 8.6 mg/dL — ABNORMAL LOW (ref 8.9–10.3)
Chloride: 105 mmol/L (ref 98–111)
Creatinine, Ser: 0.88 mg/dL (ref 0.61–1.24)
GFR calc Af Amer: >60 mL/min (ref >60)
GFR calc non Af Amer: >60 mL/min (ref >60)
Glucose, Bld: 150 mg/dL — ABNORMAL HIGH (ref 70–99)
Potassium: 3.7 mmol/L (ref 3.5–5.1)
Sodium: 138 mmol/L (ref 135–145)

## 2018-10-01 LAB — HIV ANTIBODY (ROUTINE TESTING W REFLEX): HIV Screen 4th Generation wRfx: NONREACTIVE

## 2018-10-01 LAB — D-DIMER, QUANTITATIVE: D-Dimer, Quant: 0.46 ug/mL-FEU (ref 0.00–0.50)

## 2018-10-01 MED ORDER — DOXYCYCLINE HYCLATE 100 MG PO TABS
100.0000 mg | ORAL_TABLET | Freq: Two times a day (BID) | ORAL | Status: DC
  Administered 2018-10-01 – 2018-10-02 (×3): 100 mg via ORAL
  Filled 2018-10-01 (×3): qty 1

## 2018-10-01 MED ORDER — ACETAMINOPHEN-CODEINE #3 300-30 MG PO TABS: 1.0000 | ORAL_TABLET | Freq: Three times a day (TID) | ORAL | Status: DC | PRN

## 2018-10-01 MED ORDER — PREDNISONE 20 MG PO TABS
50.0000 mg | ORAL_TABLET | Freq: Every day | ORAL | Status: DC
  Administered 2018-10-02: 50 mg via ORAL
  Filled 2018-10-01: qty 2

## 2018-10-01 MED ORDER — POTASSIUM CHLORIDE CRYS ER 20 MEQ PO TBCR
40.0000 meq | EXTENDED_RELEASE_TABLET | Freq: Once | ORAL | Status: AC
  Administered 2018-10-01: 40 meq via ORAL
  Filled 2018-10-01: qty 2

## 2018-10-01 MED ORDER — GUAIFENESIN ER 600 MG PO TB12
600.0000 mg | ORAL_TABLET | Freq: Two times a day (BID) | ORAL | Status: DC
  Administered 2018-10-01 – 2018-10-02 (×3): 600 mg via ORAL
  Filled 2018-10-01 (×3): qty 1

## 2018-10-01 NOTE — Care Management Note (Signed)
Case Management Note  Patient Details  Name: ETIENNE MOWERS MRN: [id number] Date of Birth: 1961/07/20  Subjective/Objective:   From home alone, presents with COPD flare up, a/c resp failure, on iv solumedrol, he has home oxygen with [employer] ,he is on 2 liters continously per [employer].  Patient states he has a cane, a motorized w/chair is being authorized for him.  He uses Medicaid transportation [facility]. He also has a aide for 3 hrs per day thru Medicaid who helps to clean up his home.  He has PCP.                    Action/Plan: NCM will follow for transition of care needs.   Expected Discharge Date:                  Expected Discharge Plan:  [place]  In-House Referral:     Discharge planning Services  CM Consult  Post Acute Care Choice:    Choice offered to:     DME Arranged:    DME Agency:     HH Arranged:    [place] Agency:     Status of Service:  In process, will continue to follow  If discussed at Long Length of Stay Meetings, dates discussed:    Additional Comments:  Zenon Mayo, RN 10/01/2018, 3:49 PM

## 2018-10-01 NOTE — Progress Notes (Signed)
Pt refused all PM psych medications stating, "I never take those when I'm in the hospital, you should be able to see that in my chart." Pt said that the meds make him lose motivation to get better while he is hospitalized.

## 2018-10-01 NOTE — Progress Notes (Signed)
SATURATION QUALIFICATIONS: (This note is used to comply with regulatory documentation for home oxygen)  Patient Saturations on Room Air at Rest = 90%  Patient Saturations on Room Air while Ambulating = 85%  Patient Saturations on 2 Liters of oxygen while Ambulating = 92%  Please briefly explain why patient needs home oxygen:  Pt is short of breath while ambulating or sitting still.

## 2018-10-01 NOTE — Progress Notes (Signed)
[facility] Progress Note  Patient: Tony Sullivan YCX:[id number]   PCP: [name], FNP DOB: 02-Jan-1961   DOA: 09/30/2018   DOS: 10/01/2018   Date of Service: the patient was seen and examined on 10/01/2018  Brief hospital course: Pt. with PMH of HTN, COPD; admitted on 09/30/2018, presented with complaint of cough and shortness of breath, was found to have acute COPD exacerbation with hypoxia. Currently further plan is continue current treatment.  Subjective: Still short of breath has cough.  Reports multiple complaints which are chronic including spasms in the finger, ankle spasm, ankle pain bilaterally, abdominal bloating, dizziness.  Assessment and Plan: Acute on chronic respiratory failure with hypoxia associated with COPD exacerbation -Patient's shortness of breath and productive cough are most likely caused by acute COPD exacerbation.  -He has history of O2-dependent COPD for which he uses occasional home O2 -He does not have fever but does have leukocytosis.  -Chest x-ray is not consistent with pneumonia -He was started on BIPAP in the ED with some improvement.   -Nebulizers: scheduled Duoneb and prn albuterol Switch to oral prednisone, oral doxycycline, transfer to [place] -Continue home meds - Incruse Elliptsa, Dulera -Saturation drops to 85% on exertion which is new for him.  May require oxygen going home  HTN -Continue Norvasc, Cozaar  ETOH abuse -Patient with ETOH abuse -Mild daily use, binge drinking on weekends -He is at mildly high risk for complications of withdrawal including seizures, DTs -Will observe -CIWA protocol not ordered but can be added if needed  Tobacco dependence -Tobacco Dependence: encourage cessation.   -This was discussed with the patient and should be reviewed on an ongoing basis.   -Patch ordered at patient request.  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 -He does have multiple somatic complaints without any significant findings on examination.  For now we will monitor.  Diet: cardiac diet DVT Prophylaxis: subcutaneous Heparin  Advance goals of care discussion: full code  Family Communication: no family was present at bedside, at the time of interview.   Disposition:  Discharge to home.  Patient has question about assisted living facility, recommended to follow-up with PCP regarding that.  Consultants: none Procedures: none  Scheduled Meds: . amLODipine  10 mg Oral Daily  . benztropine  0.5 mg Oral QHS  . doxycycline  100 mg Oral Q12H  . enoxaparin (LOVENOX) injection  40 mg Subcutaneous Q24H  . guaiFENesin  600 mg Oral BID  . ipratropium-albuterol  3 mL Nebulization Q6H  . loratadine  10 mg Oral Daily  . losartan  100 mg Oral Daily  . loxapine  10 mg Oral QHS  . mirtazapine  30 mg Oral QHS  . mometasone-formoterol  2 puff Inhalation BID  . nicotine  14 mg Transdermal Daily  . [START ON 10/02/2018] predniSONE  50 mg Oral Q breakfast  . risperiDONE  2 mg Oral QHS  . sodium chloride flush  3 mL Intravenous Q12H  . umeclidinium bromide  1 puff Inhalation Daily   Continuous Infusions: PRN Meds: acetaminophen-codeine, albuterol, fluticasone Antibiotics: Anti-infectives (From admission, onward)   Start     Dose/Rate Route Frequency Ordered Stop   10/01/18 1015  doxycycline (VIBRA-TABS) tablet 100 mg     100 mg Oral Every  12 hours 10/01/18 1006     09/30/18 1600  doxycycline (VIBRAMYCIN) 100 mg in sodium chloride 0.9 % 250 mL IVPB  Status:  Discontinued     100 mg 125 mL/hr over 120 Minutes Intravenous Every 12 hours 09/30/18 1524 10/01/18 0948       Objective: Physical Exam: Vitals:   10/01/18 0726 10/01/18 0801 10/01/18 1228 10/01/18 1408    BP:  117/77 129/86   Pulse:  94 95   Resp:  (!) 26 (!) 22   Temp:  98.5 F (36.9 C) (!) 97.5 F (36.4 C)   TempSrc:  Oral Oral   SpO2: 96% (!) 86% 92% 92%  Weight:      Height:        Intake/Output Summary (Last 24 hours) at 10/01/2018 1916 Last data filed at 10/01/2018 0600 Gross per 24 hour  Intake 741.97 ml  Output -  Net 741.97 ml   Filed Weights   09/30/18 0919  Weight: 73.9 kg   General: Alert, Awake and Oriented to Time, Place and Person. Appear in no distress, affect appropriate Eyes: PERRL, Conjunctiva normal ENT: Oral Mucosa clear moist. Neck: no JVD, no Abnormal Mass Or lumps Cardiovascular: S1 and S2 Present, no Murmur, Peripheral Pulses Present Respiratory: normal respiratory effort, Bilateral Air entry equal and Decreased, no use of accessory muscle, no Crackles, Occasional wheezes Abdomen: Bowel Sound present, Soft and no tenderness, no hernia Skin: no redness, no Rash, no induration Extremities: no Pedal edema, no calf tenderness Neurologic: Grossly no focal neuro deficit. Bilaterally Equal motor strength  Data Reviewed: CBC: Recent Labs  Lab 09/30/18 0929  WBC 15.4*  NEUTROABS 9.9*  HGB 18.5*  HCT 57.6*  MCV 100.5*  PLT 169   Basic Metabolic Panel: Recent Labs  Lab 09/30/18 0929 10/01/18 0808  NA 137 138  K 3.6 3.7  CL 101 105  CO2 22 23  GLUCOSE 90 150*  BUN 13 13  CREATININE 1.04 0.88  CALCIUM 9.1 8.6*    Liver Function Tests: Recent Labs  Lab 09/30/18 0929  AST 29  ALT 26  ALKPHOS 75  BILITOT 1.4*  PROT 7.1  ALBUMIN 3.6   No results for input(s): LIPASE, AMYLASE in the last 168 hours. No results for input(s): AMMONIA in the last 168 hours. Coagulation Profile: No results for input(s): INR, PROTIME in the last 168 hours. Cardiac Enzymes: Recent Labs  Lab 10/01/18 0808 10/01/18 1443  TROPONINI <0.03 0.03*   BNP (last 3 results) No results for input(s): PROBNP in the last 8760 hours. CBG: No results for input(s):  GLUCAP in the last 168 hours. Studies: No results found.   Time spent: 35 minutes  Author: Berle Mull, MD [facility] Pager: [phone] 10/01/2018 7:16 PM  Between 7PM-7AM, please contact night-coverage at [email or web address], password [email or web address]

## 2018-10-02 LAB — BASIC METABOLIC PANEL
Anion gap: 9 (ref 5–15)
BUN: 14 mg/dL (ref 6–20)
CO2: 26 mmol/L (ref 22–32)
Calcium: 8.8 mg/dL — ABNORMAL LOW (ref 8.9–10.3)
Chloride: 107 mmol/L (ref 98–111)
Creatinine, Ser: 0.97 mg/dL (ref 0.61–1.24)
GFR calc Af Amer: >60 mL/min (ref >60)
GFR calc non Af Amer: >60 mL/min (ref >60)
Glucose, Bld: 139 mg/dL — ABNORMAL HIGH (ref 70–99)
Potassium: 4 mmol/L (ref 3.5–5.1)
Sodium: 142 mmol/L (ref 135–145)

## 2018-10-02 LAB — MAGNESIUM: Magnesium: 2.1 mg/dL (ref 1.7–2.4)

## 2018-10-02 MED ORDER — ALBUTEROL SULFATE HFA 108 (90 BASE) MCG/ACT IN AERS: 1.0000 | INHALATION_SPRAY | RESPIRATORY_TRACT | 0 refills | Status: DC | PRN

## 2018-10-02 MED ORDER — GUAIFENESIN ER 600 MG PO TB12: 600.0000 mg | ORAL_TABLET | Freq: Two times a day (BID) | ORAL | 0 refills | Status: DC

## 2018-10-02 MED ORDER — PREDNISONE 10 MG PO TABS: ORAL_TABLET | ORAL | 0 refills | Status: DC

## 2018-10-02 MED ORDER — DOXYCYCLINE HYCLATE 100 MG PO TABS: 100.0000 mg | ORAL_TABLET | Freq: Two times a day (BID) | ORAL | 0 refills | Status: AC

## 2018-10-02 MED ORDER — NICOTINE 14 MG/24HR TD PT24: 14.0000 mg | MEDICATED_PATCH | Freq: Every day | TRANSDERMAL | 0 refills | Status: DC

## 2018-10-02 MED ORDER — IPRATROPIUM-ALBUTEROL 0.5-2.5 (3) MG/3ML IN SOLN: 3.0000 mL | Freq: Two times a day (BID) | RESPIRATORY_TRACT | Status: DC

## 2018-10-02 NOTE — Care Management Note (Addendum)
Case Management Note  Patient Details  Name: Tony Sullivan MRN: [id number] Date of Birth: May 06, 1961  Subjective/Objective:  From home alone, presents with COPD flare up, a/c resp failure, on iv solumedrol, he has home oxygen with [employer] ,he is on 2 liters continously per [employer].  Patient states he has a cane, a motorized w/chair is being authorized for him.  He uses Medicaid transportation to [facility]. He also has a aide for 3 hrs per day thru Medicaid who helps to clean up his home.  He has PCP.    12/6 Tomi Bamberger RN, BSN - patient is for dc today, NCM contacted [employer] to have a oxygen tank to be brought to patient room prior to dc.  NCM offered choice to patient, he chose [facility], referral made to [facility] with [facility] for [facility] for disease Management of COPD.  NCM awaiting call back to see if they can take referral. And waiting for oxygen tank to be delivered to patient room. Patient states his contact numbers are [phone] cell, [phone].  NCM received call from [facility] stating they can take referral.                                    Action/Plan: Waiting for oxygen tank to be delivered to patient room.   Expected Discharge Date:  10/02/18               Expected Discharge Plan:  [place]  In-House Referral:     Discharge planning Services  CM Consult  Post Acute Care Choice:  Home Health Choice offered to:  Patient  DME Arranged:    DME Agency:     HH Arranged:  RN, Disease Management [place] Agency:  [place]  Status of Service:  Completed, signed off  If discussed at [place] of Stay Meetings, dates discussed:    Additional Comments:  Zenon Mayo, RN 10/02/2018, 12:31 PM

## 2018-10-02 NOTE — Progress Notes (Signed)
Nutrition Consult/Brief Note  RD consulted via COPD Exacerbation Protocol.  Wt Readings from Last 15 Encounters:  09/30/18 73.9 kg  09/18/18 73.9 kg  08/12/18 74.4 kg  07/27/18 76.8 kg  06/24/18 76.4 kg  06/10/18 75.8 kg  05/25/18 75.3 kg  05/21/18 75.3 kg  05/12/18 77.1 kg  04/28/18 75.8 kg  03/16/18 76.2 kg  01/30/18 75.8 kg  12/02/17 75.9 kg  11/03/17 75.3 kg  08/28/17 70.3 kg   Body mass index is 23.39 kg/m. Patient meets criteria for Normal based on current BMI.   Current diet order is Regular. Pt reports a good appetite. Labs and medications reviewed.   No nutrition interventions warranted at this time. If nutrition issues arise, please consult RD.   Arthur Holms, RD, LDN Pager #: [phone] After-Hours Pager #: [phone]

## 2018-10-03 NOTE — Discharge Summary (Signed)
[facility] Discharge Summary   Patient: Tony Sullivan YBW:[id number]   PCP: [name], FNP DOB: 26-Mar-1961   Date of admission: 09/30/2018   Date of discharge: 10/02/2018    Discharge Diagnoses:   Principal Problem:   Acute on chronic respiratory failure with hypoxia ([place]) Active Problems:   COPD with acute exacerbation ([place])   Essential hypertension   ETOH abuse   Cigarette smoker   Admitted From: home Disposition:  Home with home health  Recommendations for Outpatient Follow-up:  1. Please folllow up with PCP in 1 week   Follow-up Information    [name], FNP. Schedule an appointment as soon as possible for a visit in 1 week(s).   Specialty:  Nurse Practitioner Contact information: [address] [place] [address] [phone]        [facility], [facility] Follow up.   Specialty:  Home Health Services Why:  [facility] Contact information: [address] [place] [address] [phone]          Diet recommendation: cardiac diet  Activity: The patient is advised to gradually reintroduce usual activities.  Discharge Condition: fair  Code Status: full code  History of present illness: As per the H and P dictated on admission, "Tony Sullivan is a 57 y.o. male with medical history significant of HTN and COPD on nocturnal and prn O2 presenting with SOB.  Patient presented with SOB - it got worse after he saw Dr. [name] on 11/22, maybe around the end of the month.  Cough, productive of mostly clear sputum but increased in volume.  He doesn't use drugs but was drinking the other day with some guys he doesn't usually drink with an he got some jittery he worried they gave him drugs.  Last seen by Dr. [name] on 11/22.  COPD gold II, recommended to reconsider pulmonary rehab.  Ongoing efforts at smoking cessation encouraged.  ED Course:  COPD exacerbation, smoker.  Some viral symptoms, ran out of medications.  Placed  on BIPAP.  Labs ok, blood gas ok.  Given neb, prednisone, much more comfortable.  Flu in process."  Hospital Course:  Summary of his active problems in the hospital is as following. Acute on chronic respiratory failure with hypoxia associated with COPD exacerbation -Patient's shortness of breath and productive cough are most likely caused by acute COPD exacerbation.  -Hehas history of O2-dependent COPD for which he uses occasional home O2 -He does not have feverbut does haveleukocytosis. -Chest x-ray is not consistent with pneumonia -He was started on BIPAPin the ED with some improvement.  -Nebulizers: scheduled Duoneb and prn albuterol Switch to oral prednisone, oral doxycycline -Continue home meds - Incruse Elliptsa, Dulera -Saturation drops to 85% on exertion which is new for him. Will  require oxygen going home  HTN -Continue Norvasc, Cozaar  ETOH abuse -Patient withETOH abuse -Mild daily use, binge drinking on weekends  Tobacco dependence -Tobacco Dependence: encourage cessation.  -This was discussed with the patient and should be reviewed on an ongoing basis.  -Patch ordered at patient request.  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 -He does have multiple somatic complaints without any significant findings on examination.  For now monitor.  All other chronic medical condition  were stable during the hospitalization.  Patient was ambulatory without any assistance. On the day of the discharge the patient's vitals were stable , and no other acute medical condition were reported by patient. the patient was felt safe to be discharge at home with home health.  Consultants: none Procedures: noen  DISCHARGE  MEDICATION: Allergies as of 10/02/2018      Reactions   Penicillins Anaphylaxis   Has patient had a PCN reaction causing immediate rash, facial/tongue/throat swelling, SOB or lightheadedness with hypotension: Yes Has patient had a PCN reaction causing severe rash involving mucus membranes or skin necrosis: No Has patient had a PCN reaction that required hospitalization No Has patient had a PCN reaction occurring within the last 10 years: No If all of the above answers are "NO", then may proceed with Cephalosporin use.   Sulfa Antibiotics Swelling   Swelling of hands and throat    Famotidine Other (See Comments)   Bloating of stomach, loss of appetite   Levocetirizine Other (See Comments)   Muscle cramps      Medication List    TAKE these medications   albuterol (2.5 MG/3ML) 0.083% nebulizer solution Commonly known as:  PROVENTIL Take 3 mLs (2.5 mg total) by nebulization every 4 (four) hours as needed for wheezing or shortness of breath.   albuterol 108 (90 Base) MCG/ACT inhaler Commonly known as:  PROVENTIL HFA;VENTOLIN HFA Inhale 1-2 puffs into the lungs every 4 (four) hours as needed. INHALE TWO PUFFS UP TO EVERY 4 HOURS IF NEEDED FOR SHORT OF BREATH   amLODipine 10 MG tablet Commonly known as:  NORVASC Take 10 mg by mouth daily.   benztropine 0.5 MG tablet Commonly known as:  COGENTIN Take 0.5 mg by mouth at bedtime.   budesonide-formoterol 160-4.5 MCG/ACT inhaler Commonly known as:  SYMBICORT INHALE TWO PUFFS INTO THE LUNGS TWO (TWO) TIMES DAILY. What changed:    how much to take  how to take this  when to take this  additional instructions   cetirizine 10 MG tablet Commonly known as:  ZYRTEC Take 10 mg by mouth daily.   docusate sodium 100 MG capsule Commonly known as:  COLACE Take 100 mg by mouth daily.   doxycycline 100 MG tablet Commonly known as:  VIBRA-TABS Take 1 tablet (100 mg total) by mouth 2 (two) times daily for 4 days.   famotidine 20 MG  tablet Commonly known as:  PEPCID Take 20 mg by mouth at bedtime.   fluticasone 50 MCG/ACT nasal spray Commonly known as:  FLONASE Place 2 sprays into both nostrils 3 (three) times daily as needed for allergies.   FLUTTER Devi Use as directed   guaiFENesin 600 MG 12 hr tablet Commonly known as:  MUCINEX Take 1 tablet (600 mg total) by mouth 2 (two) times daily.   losartan 100 MG tablet Commonly known as:  COZAAR Take 100 mg by mouth daily.   loxapine 10 MG capsule Commonly known as:  LOXITANE Take 10 mg by mouth at bedtime.   mirtazapine 30 MG tablet Commonly known as:  REMERON Take 30 mg by mouth at bedtime.   multivitamin capsule Take 1 capsule by mouth daily.   nicotine 14 mg/24hr patch Commonly known as:  NICODERM CQ - dosed in mg/24 hours Place 1 patch (14 mg total) onto the skin daily.   OXYGEN Inhale 2 L into the lungs as directed. 2-3 liters with sleep and exertion as needed for low oxygen   predniSONE 10 MG tablet  Commonly known as:  DELTASONE Take 30mg  daily for 3days,Take 20mg  daily for 3days,Take 10mg  daily for 3days, then stop   risperiDONE 2 MG tablet Commonly known as:  RISPERDAL Take 2 mg by mouth at bedtime.   Tiotropium Bromide Monohydrate 2.5 MCG/ACT Aers 2 pffs each  am What changed:    how much to take  how to take this  when to take this  additional instructions      Allergies  Allergen Reactions  . Penicillins Anaphylaxis    Has patient had a PCN reaction causing immediate rash, facial/tongue/throat swelling, SOB or lightheadedness with hypotension: Yes Has patient had a PCN reaction causing severe rash involving mucus membranes or skin necrosis: No Has patient had a PCN reaction that required hospitalization No Has patient had a PCN reaction occurring within the last 10 years: No If all of the above answers are "NO", then may proceed with Cephalosporin use.   . Sulfa Antibiotics Swelling    Swelling of hands and throat   .  Famotidine Other (See Comments)    Bloating of stomach, loss of appetite  . Levocetirizine Other (See Comments)    Muscle cramps   Discharge Instructions    Diet - low sodium heart healthy   Complete by:  As directed    Increase activity slowly   Complete by:  As directed      Discharge Exam: Filed Weights   09/30/18 0919  Weight: 73.9 kg   Vitals:   10/02/18 0734 10/02/18 0802  BP:  116/79  Pulse:  85  Resp:  13  Temp:  97.9 F (36.6 C)  SpO2: 91% 95%   General: Appear in no distress, no Rash; Oral Mucosa moist. Cardiovascular: S1 and S2 Present, no Murmur, no JVD Respiratory: Bilateral Air entry present and Clear to Auscultation, no Crackles, no wheezes Abdomen: Bowel Sound present, Soft and no tenderness Extremities: no Pedal edema, no calf tenderness Neurology: Grossly no focal neuro deficit.  The results of significant diagnostics from this hospitalization (including imaging, microbiology, ancillary and laboratory) are listed below for reference.    Significant Diagnostic Studies: Dg Chest Port 1 View  Result Date: 09/30/2018 CLINICAL DATA:  Shortness of breath, COPD, asthma. EXAM: PORTABLE CHEST 1 VIEW COMPARISON:  05/23/2018. FINDINGS: The heart size and mediastinal contours are within normal limits. Both lungs are clear. The visualized skeletal structures are unremarkable. Mild hyperaeration. IMPRESSION: No active disease.  Stable appearance from priors. Electronically Signed   By: Staci Righter M.D.   On: 09/30/2018 10:12    Microbiology: Recent Results (from the past 240 hour(s))  MRSA PCR Screening     Status: None   Collection Time: 09/30/18  4:42 PM  Result Value Ref Range Status   MRSA by PCR NEGATIVE NEGATIVE Final    Comment:        The GeneXpert MRSA Assay (FDA approved for NASAL specimens only), is one component of a comprehensive MRSA colonization surveillance program. It is not intended to diagnose MRSA infection nor to guide or monitor  treatment for MRSA infections. Performed at [facility], [place] [address], [place], [place] [address]      Labs: CBC: Recent Labs  Lab 09/30/18 0929  WBC 15.4*  NEUTROABS 9.9*  HGB 18.5*  HCT 57.6*  MCV 100.5*  PLT 950   Basic Metabolic Panel: Recent Labs  Lab 09/30/18 0929 10/01/18 0808 10/02/18 0219  NA 137 138 142  K 3.6 3.7 4.0  CL 101 105  107  CO2 22 23 26   GLUCOSE 90 150* 139*  BUN 13 13 14   CREATININE 1.04 0.88 0.97  CALCIUM 9.1 8.6* 8.8*  MG  --   --  2.1   Liver Function Tests: Recent Labs  Lab 09/30/18 0929  AST 29  ALT 26  ALKPHOS 75  BILITOT 1.4*  PROT 7.1  ALBUMIN 3.6   No results for input(s): LIPASE, AMYLASE in the last 168 hours. No results for input(s): AMMONIA in the last 168 hours. Cardiac Enzymes: Recent Labs  Lab 10/01/18 0808 10/01/18 1443 10/01/18 2038  TROPONINI <0.03 0.03* <0.03   BNP (last 3 results) No results for input(s): BNP in the last 8760 hours. CBG: No results for input(s): GLUCAP in the last 168 hours. Time spent: 35 minutes  Signed:  Berle Mull  [facility] 10/02/2018 , 8:36 AM

## 2018-10-18 ENCOUNTER — Emergency Department (HOSPITAL_COMMUNITY): Payer: Medicaid Other

## 2018-10-18 ENCOUNTER — Emergency Department (HOSPITAL_COMMUNITY)
Admission: EM | Admit: 2018-10-18 | Discharge: 2018-10-19 | Disposition: A | Discharge status: Home / Self Care | Payer: Medicaid Other | Attending: Emergency Medicine | Admitting: Emergency Medicine

## 2018-10-18 ENCOUNTER — Encounter (HOSPITAL_COMMUNITY): Payer: Self-pay

## 2018-10-18 ENCOUNTER — Other Ambulatory Visit: Payer: Self-pay

## 2018-10-18 DIAGNOSIS — J441 Chronic obstructive pulmonary disease with (acute) exacerbation: Secondary | ICD-10-CM

## 2018-10-18 DIAGNOSIS — K219 Gastro-esophageal reflux disease without esophagitis: Secondary | ICD-10-CM | POA: Insufficient documentation

## 2018-10-18 DIAGNOSIS — F17210 Nicotine dependence, cigarettes, uncomplicated: Secondary | ICD-10-CM | POA: Insufficient documentation

## 2018-10-18 DIAGNOSIS — Z79899 Other long term (current) drug therapy: Secondary | ICD-10-CM | POA: Diagnosis not present

## 2018-10-18 DIAGNOSIS — Z720 Tobacco use: Secondary | ICD-10-CM

## 2018-10-18 DIAGNOSIS — R0602 Shortness of breath: Secondary | ICD-10-CM | POA: Diagnosis present

## 2018-10-18 LAB — BASIC METABOLIC PANEL
Anion gap: 9 (ref 5–15)
BUN: 11 mg/dL (ref 6–20)
CO2: 23 mmol/L (ref 22–32)
Calcium: 8.7 mg/dL — ABNORMAL LOW (ref 8.9–10.3)
Chloride: 108 mmol/L (ref 98–111)
Creatinine, Ser: 0.81 mg/dL (ref 0.61–1.24)
GFR calc Af Amer: >60 mL/min (ref >60)
GFR calc non Af Amer: >60 mL/min (ref >60)
Glucose, Bld: 124 mg/dL — ABNORMAL HIGH (ref 70–99)
Potassium: 6 mmol/L — ABNORMAL HIGH (ref 3.5–5.1)
Sodium: 140 mmol/L (ref 135–145)

## 2018-10-18 LAB — CBC WITH DIFFERENTIAL/PLATELET
Abs Immature Granulocytes: 0.22 10*3/uL — ABNORMAL HIGH (ref 0.00–0.07)
Basophils Absolute: 0.1 10*3/uL (ref 0.0–0.1)
Basophils Relative: 0 %
Eosinophils Absolute: 0 10*3/uL (ref 0.0–0.5)
Eosinophils Relative: 0 %
HCT: 51.5 % (ref 39.0–52.0)
Hemoglobin: 16.2 g/dL (ref 13.0–17.0)
Immature Granulocytes: 1 %
Lymphocytes Relative: 5 %
Lymphs Abs: 1.1 10*3/uL (ref 0.7–4.0)
MCH: 32.7 pg (ref 26.0–34.0)
MCHC: 31.5 g/dL (ref 30.0–36.0)
MCV: 104 fL — ABNORMAL HIGH (ref 80.0–100.0)
Monocytes Absolute: 1.3 10*3/uL — ABNORMAL HIGH (ref 0.1–1.0)
Monocytes Relative: 6 %
Neutro Abs: 18.2 10*3/uL — ABNORMAL HIGH (ref 1.7–7.7)
Neutrophils Relative %: 88 %
Platelets: 259 10*3/uL (ref 150–400)
RBC: 4.95 MIL/uL (ref 4.22–5.81)
RDW: 14.2 % (ref 11.5–15.5)
WBC: 20.9 10*3/uL — ABNORMAL HIGH (ref 4.0–10.5)
nRBC: 0 % (ref 0.0–0.2)

## 2018-10-18 LAB — TROPONIN I: Troponin I: <0.03 ng/mL (ref <0.03)

## 2018-10-18 LAB — LIPASE, BLOOD: Lipase: 24 U/L (ref 11–51)

## 2018-10-18 LAB — BRAIN NATRIURETIC PEPTIDE: B Natriuretic Peptide: 33.2 pg/mL (ref 0.0–100.0)

## 2018-10-18 IMAGING — CR DG CHEST 2V
3 series · 3 of 3 positions shown · non-contrast
Comparison: [DATE]

CLINICAL DATA: Shortness of breath

EXAM:
CHEST - 2 VIEW

[w chest lat]
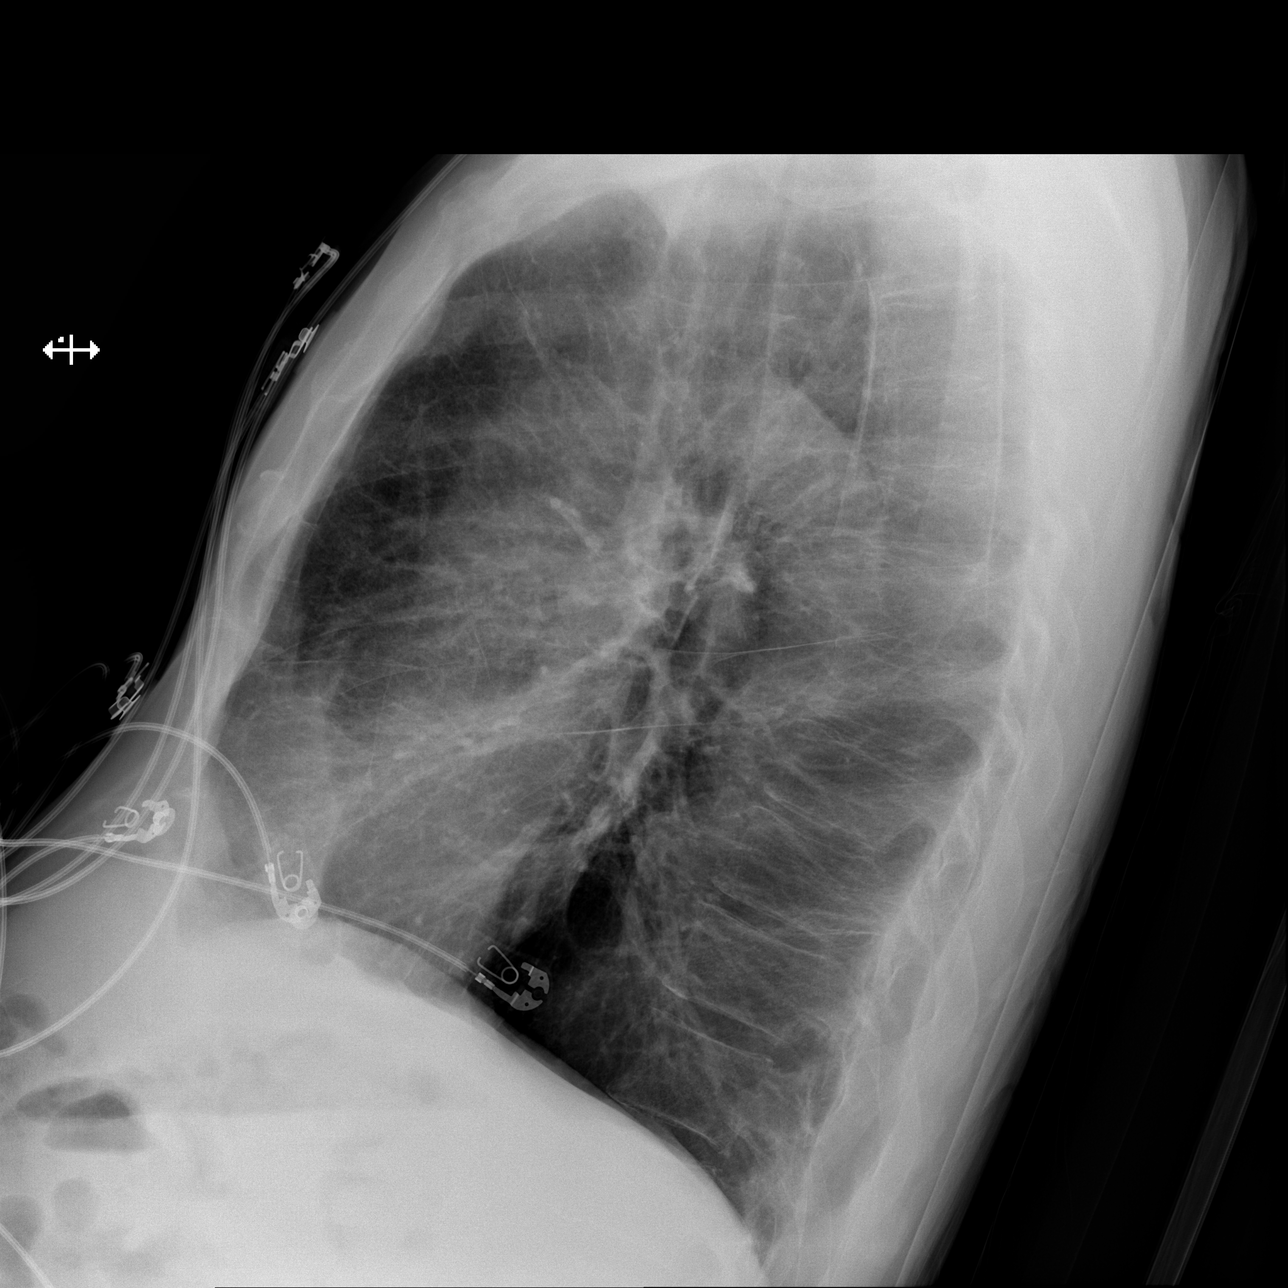

[x chest ap (1 of 2)]
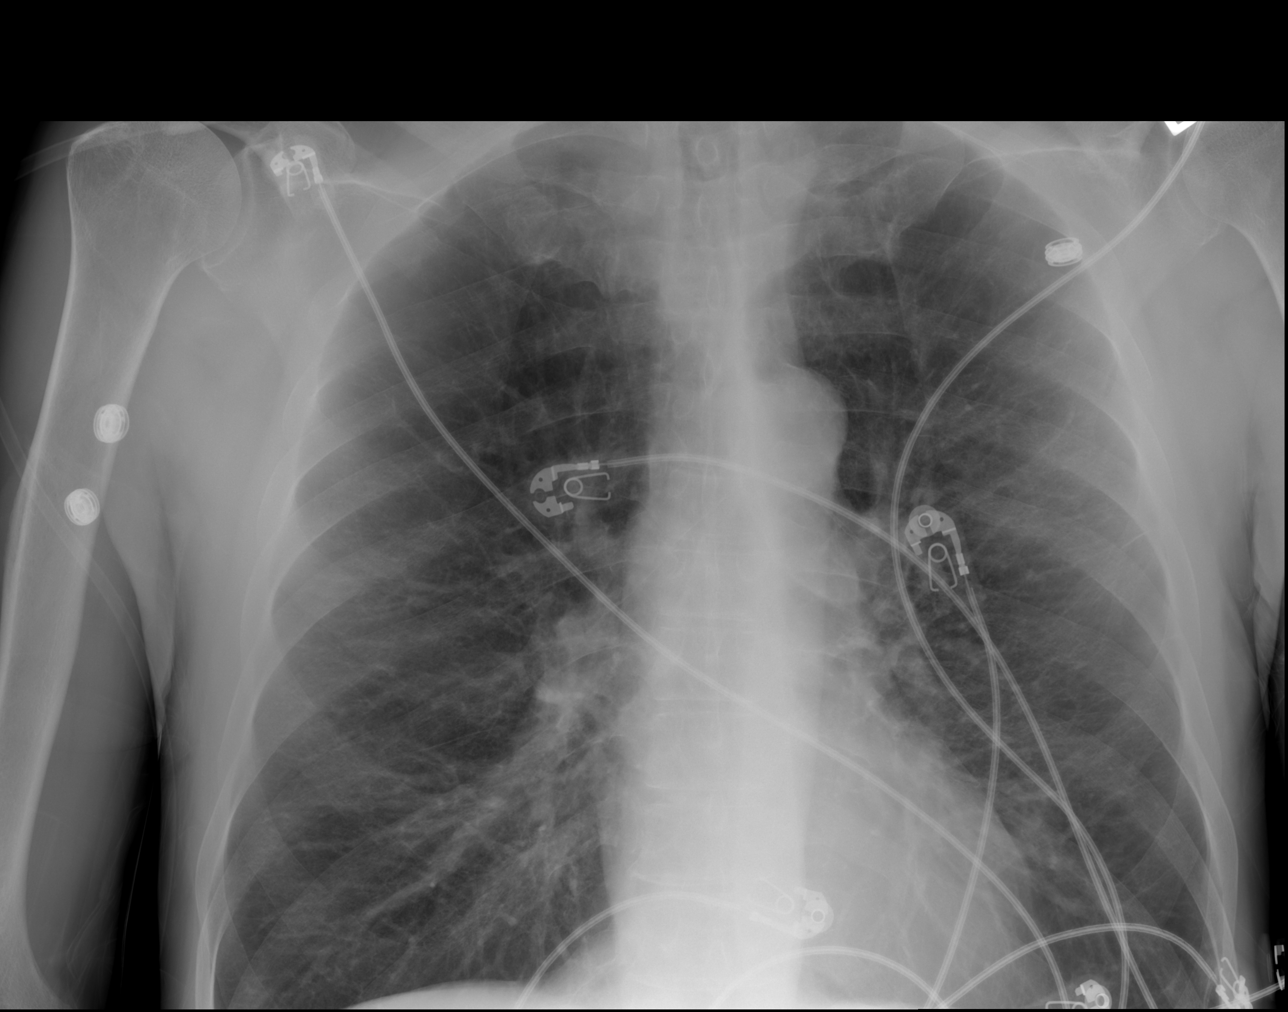

[x chest ap (2 of 2)]
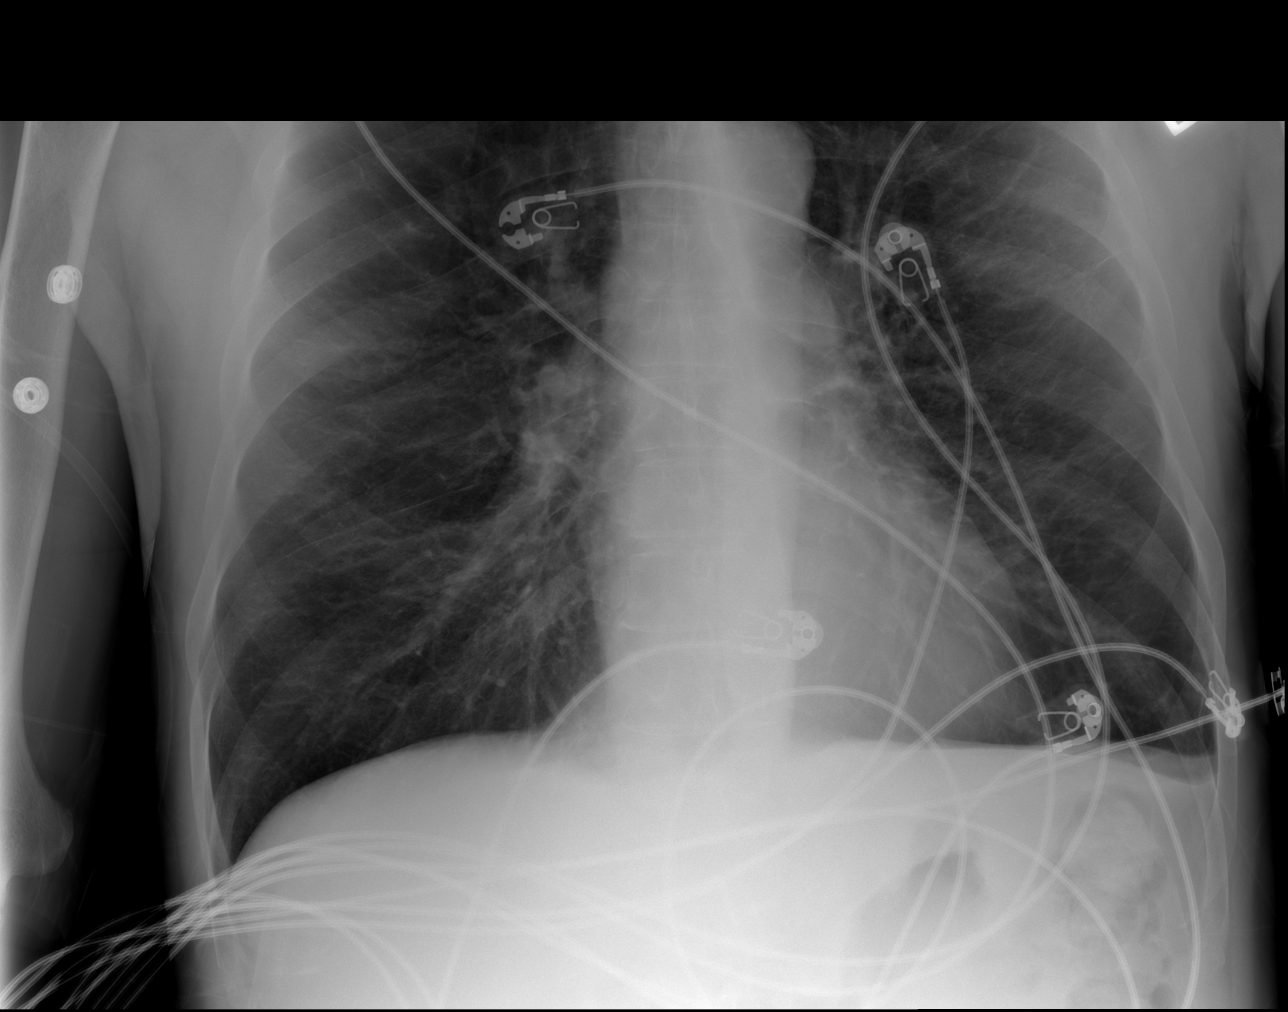

[3 of 3 positions shown; findings below may reference images not displayed]

FINDINGS: Hyperinflation with emphysematous disease. No acute consolidation or
effusion. Stable heart size. No pneumothorax.
IMPRESSION: No active cardiopulmonary disease. Hyperinflation with emphysematous
disease.

## 2018-10-18 MED ORDER — SODIUM CHLORIDE 0.9 % IV BOLUS
1000.0000 mL | Freq: Once | INTRAVENOUS | Status: AC
  Administered 2018-10-18: 1000 mL via INTRAVENOUS

## 2018-10-18 MED ORDER — METHYLPREDNISOLONE SODIUM SUCC 125 MG IJ SOLR
125.0000 mg | Freq: Once | INTRAMUSCULAR | Status: AC
  Administered 2018-10-18: 125 mg via INTRAVENOUS
  Filled 2018-10-18: qty 2

## 2018-10-18 MED ORDER — PANTOPRAZOLE SODIUM 20 MG PO TBEC: 20.0000 mg | DELAYED_RELEASE_TABLET | Freq: Every day | ORAL | 0 refills | Status: DC

## 2018-10-18 MED ORDER — IPRATROPIUM-ALBUTEROL 0.5-2.5 (3) MG/3ML IN SOLN
3.0000 mL | Freq: Once | RESPIRATORY_TRACT | Status: AC
  Administered 2018-10-18: 3 mL via RESPIRATORY_TRACT
  Filled 2018-10-18: qty 3

## 2018-10-18 MED ORDER — PANTOPRAZOLE SODIUM 20 MG PO TBEC
20.0000 mg | DELAYED_RELEASE_TABLET | Freq: Once | ORAL | Status: AC
  Administered 2018-10-18: 20 mg via ORAL
  Filled 2018-10-18: qty 1

## 2018-10-18 NOTE — ED Notes (Signed)
Patient transported to X-ray 

## 2018-10-18 NOTE — ED Notes (Signed)
Bed: SL37 Expected date:  Expected time:  Means of arrival:  Comments: EMS seizure

## 2018-10-18 NOTE — ED Notes (Signed)
Bed: VP36 Expected date:  Expected time:  Means of arrival:  Comments: SOB EMS

## 2018-10-18 NOTE — ED Provider Notes (Signed)
[name] Provider Note   CSN: [id number] Arrival date & time: 10/18/18  2132     History   Chief Complaint Chief Complaint  Patient presents with  . Shortness of Breath    HPI Tony Sullivan is a 57 y.o. male.  Pt presents to the ED today with SOB and epigastric abdominal pain.  The pt was admitted for a COPD exacerbation on 12/4-6.  He was d/c home with oxygen.  He does not feel like he has been well since d/c.  He has not been able to f/u with pulmonology as he has transportation issues.  The pt also said he was supposed to get pulmonary rehab, but has not been able to go to that because of transportation issues.  The pt said he still smokes, but much less that he did.  He c/o epigastric abd pain and hoarseness which he's had for awhile.  He has seen GI ([name]) for these sx and it has been thought to be due to GERD and from COPD.  He denies any f/c.  Pt has quit drinking for 2 months.  He has quit smoking marijuana for 4 years.  He has decreased smoking, but he is still not able to quit.  He is working on it.     Past Medical History:  Diagnosis Date  . COPD (chronic obstructive pulmonary disease) ([place]) 2004   diagnosed in 2004, no PFT's to date.  Started on home O2 12/2013, after found to be desatting at PCP's office, and referred to pulmonology.  . High blood pressure   . Seasonal allergies   . Tobacco dependence     Patient Active Problem List   Diagnosis Date Noted  . Abdominal pain   . Poor dentition 11/04/2017  . Acute on chronic respiratory failure with hypoxia ([place]) 08/29/2017  . ETOH abuse 03/30/2017  . Cigarette smoker 03/30/2017  . Acute respiratory failure with hypoxia and hypercapnia ([place]) 03/29/2017  . Special screening for malignant neoplasms, colon   . Benign neoplasm of ascending colon   . Benign neoplasm of descending colon   . Hoarseness 08/22/2016  . GERD (gastroesophageal reflux disease) 08/22/2016  .  Atypical chest pain 12/05/2015  . Essential hypertension 06/15/2014  . Pectoralis muscle strain 01/11/2014  . COPD with acute exacerbation ([place]) 01/10/2014  . COPD  GOLD II 01/08/2014  . Smoker 01/08/2014  . Cough 01/08/2014  . Chronic respiratory failure with hypoxia ([place]) 01/08/2014    Past Surgical History:  Procedure Laterality Date  . APPENDECTOMY  1980  . COLONOSCOPY WITH PROPOFOL N/A 12/10/2016   Procedure: COLONOSCOPY WITH PROPOFOL;  Surgeon: [name], MD;  Location: [facility];  Service: Endoscopy;  Laterality: N/A;  . HIP SURGERY Right   . SHOULDER ARTHROSCOPY Right         Home Medications    Prior to Admission medications   Medication Sig Start Date End Date Taking? Authorizing Provider  albuterol (PROAIR HFA) 108 (90 Base) MCG/ACT inhaler Inhale 1-2 puffs into the lungs every 4 (four) hours as needed. INHALE TWO PUFFS UP TO EVERY 4 HOURS IF NEEDED FOR SHORT OF BREATH 10/02/18   [name], MD  albuterol (PROVENTIL) (2.5 MG/3ML) 0.083% nebulizer solution Take 3 mLs (2.5 mg total) by nebulization every 4 (four) hours as needed for wheezing or shortness of breath. Patient not taking: Reported on 09/30/2018 04/28/18   [name], NP  amLODipine (NORVASC) 10 MG tablet Take 10 mg  by mouth daily.     [provider]  benztropine (COGENTIN) 0.5 MG tablet Take 0.5 mg by mouth at bedtime.     [provider]  budesonide-formoterol (SYMBICORT) 160-4.5 MCG/ACT inhaler INHALE TWO PUFFS INTO THE LUNGS TWO (TWO) TIMES DAILY. Patient taking differently: Inhale 2 puffs into the lungs 2 (two) times daily. . 04/28/18   [name], NP  cetirizine (ZYRTEC) 10 MG tablet Take 10 mg by mouth daily.    [provider]  docusate sodium (COLACE) 100 MG capsule Take 100 mg by mouth daily.     [provider]  famotidine (PEPCID) 20 MG tablet Take 20 mg by mouth at bedtime.    [provider]  fluticasone (FLONASE) 50 MCG/ACT nasal  spray Place 2 sprays into both nostrils 3 (three) times daily as needed for allergies.     [provider]  guaiFENesin (MUCINEX) 600 MG 12 hr tablet Take 1 tablet (600 mg total) by mouth 2 (two) times daily. 10/02/18   [name], MD  losartan (COZAAR) 100 MG tablet Take 100 mg by mouth daily.    [provider]  loxapine (LOXITANE) 10 MG capsule Take 10 mg by mouth at bedtime.     [provider]  mirtazapine (REMERON) 30 MG tablet Take 30 mg by mouth at bedtime.    [provider]  Multiple Vitamin (MULTIVITAMIN) capsule Take 1 capsule by mouth daily.    [provider]  nicotine (NICODERM CQ - DOSED IN MG/24 HOURS) 14 mg/24hr patch Place 1 patch (14 mg total) onto the skin daily. 10/03/18   [name], MD  OXYGEN Inhale 2 L into the lungs as directed. 2-3 liters with sleep and exertion as needed for low oxygen    [provider]  pantoprazole (PROTONIX) 20 MG tablet Take 1 tablet (20 mg total) by mouth daily. 10/18/18   [name], MD  predniSONE (DELTASONE) 10 MG tablet Take 30mg  daily for 3days,Take 20mg  daily for 3days,Take 10mg  daily for 3days, then stop 10/02/18   [name], MD  Respiratory Therapy Supplies (FLUTTER) DEVI Use as directed 04/01/16   [name], MD  risperiDONE (RISPERDAL) 2 MG tablet Take 2 mg by mouth at bedtime.    [provider]  Tiotropium Bromide Monohydrate (SPIRIVA RESPIMAT) 2.5 MCG/ACT AERS 2 pffs each  am Patient taking differently: Inhale 2 puffs into the lungs daily.  07/27/18   [name], MD    Family History Family History  Problem Relation Age of Onset  . Emphysema Mother   . Allergies Mother        "everyone in family"  . Asthma Mother        "everyone in family"  . Heart disease Mother   . Clotting disorder Mother   . Cancer Mother   . Emphysema Father   . Allergies Father   . Allergies Son   . Heart disease Maternal Aunt     Social History Social  History   Tobacco Use  . Smoking status: Current Every Day Smoker    Packs/day: 0.25    Years: 36.00    Pack years: 9.00    Types: Cigarettes  . Smokeless tobacco: Never Used  . Tobacco comment: 3-4 cigarettes/day 07/27/2018  Substance Use Topics  . Alcohol use: Yes    Alcohol/week: 14.0 - 21.0 standard drinks    Types: 14 - 21 Standard drinks or equivalent per week    Comment:  2-3 beers/day, with occasional "binges".  No history of withdrawal  . Drug use: Yes    Types: Marijuana    Comment: daily     Allergies   Penicillins; Sulfa antibiotics; Famotidine; and Levocetirizine   Review of Systems Review of Systems  Respiratory: Positive for cough and shortness of breath.   Gastrointestinal: Positive for abdominal pain.  All other systems reviewed and are negative.    Physical Exam Updated Vital Signs BP (!) 137/100 (BP Location: Right Arm)   Pulse 93   Temp (!) 97.5 F (36.4 C) (Oral)   Resp (!) 22   Ht 5\' 10"  (1.778 m)   Wt 73 kg   SpO2 95%   BMI 23.09 kg/m   Physical Exam Vitals signs and nursing note reviewed.  Constitutional:      Appearance: He is well-developed and normal weight.  HENT:     Head: Normocephalic and atraumatic.     Mouth/Throat:     Mouth: Mucous membranes are moist.     Pharynx: Oropharynx is clear.  Eyes:     Extraocular Movements: Extraocular movements intact.     Pupils: Pupils are equal, round, and reactive to light.  Neck:     Musculoskeletal: Normal range of motion and neck supple.  Cardiovascular:     Rate and Rhythm: Normal rate and regular rhythm.  Pulmonary:     Effort: Pulmonary effort is normal.     Breath sounds: Wheezing present.  Abdominal:     General: Bowel sounds are normal.     Palpations: Abdomen is soft.  Musculoskeletal: Normal range of motion.  Skin:    General: Skin is warm and dry.     Capillary Refill: Capillary refill takes less than 2 seconds.  Neurological:     General: No focal deficit present.       Mental Status: He is alert and oriented to person, place, and time.  Psychiatric:        Mood and Affect: Mood normal.        Behavior: Behavior normal.      ED Treatments / Results  Labs (all labs ordered are listed, but only abnormal results are displayed) Labs Reviewed  BASIC METABOLIC PANEL - Abnormal; Notable for the following components:      Result Value   Potassium 6.0 (*)    Glucose, Bld 124 (*)    Calcium 8.7 (*)    All other components within normal limits  CBC WITH DIFFERENTIAL/PLATELET - Abnormal; Notable for the following components:   WBC 20.9 (*)    MCV 104.0 (*)    Neutro Abs 18.2 (*)    Monocytes Absolute 1.3 (*)    Abs Immature Granulocytes 0.22 (*)    All other components within normal limits  BRAIN NATRIURETIC PEPTIDE  TROPONIN I  LIPASE, BLOOD    EKG None  Radiology Dg Chest 2 View  Result Date: 10/18/2018 CLINICAL DATA:  Shortness of breath EXAM: CHEST - 2 VIEW COMPARISON:  09/30/2018 FINDINGS: Hyperinflation with emphysematous disease. No acute consolidation or effusion. Stable heart size. No pneumothorax. IMPRESSION: No active cardiopulmonary disease. Hyperinflation with emphysematous disease. Electronically Signed   By: Donavan Foil M.D.   On: 10/18/2018 22:25    Procedures Procedures (including critical care time)  Medications Ordered in ED Medications  sodium chloride 0.9 % bolus 1,000 mL (1,000 mLs Intravenous New Bag/Given 10/18/18 2243)  pantoprazole (PROTONIX) EC tablet 20 mg (has no administration in time range)  ipratropium-albuterol (DUONEB) 0.5-2.5 (3)  MG/3ML nebulizer solution 3 mL (3 mLs Nebulization Given 10/18/18 2318)  methylPREDNISolone sodium succinate (SOLU-MEDROL) 125 mg/2 mL injection 125 mg (125 mg Intravenous Given 10/18/18 2317)     Initial Impression / Assessment and Plan / ED Course  I have reviewed the triage vital signs and the nursing notes.  Pertinent labs & imaging results that were available during  my care of the patient were reviewed by me and considered in my medical decision making (see chart for details).    Pt is feeling better.  He has a chronically elevated WBC from chronic prednisone use.  His K is elevated, but it is likely from hemolysis as his kidney function is good.  His epigastric pain has been evaluated by [name].  He is supposed to be on a PPI, so he will be given a rx for protonix.  He is oxygenating well.  No pna on CXR.  He is told to f/u with pulmonology and to continue to try to stop smoking.  Return if worse.  Final Clinical Impressions(s) / ED Diagnoses   Final diagnoses:  COPD exacerbation ([place])  Gastroesophageal reflux disease, esophagitis presence not specified  Tobacco abuse    ED Discharge Orders         Ordered    pantoprazole (PROTONIX) 20 MG tablet  Daily     10/18/18 2335           [name], MD 10/18/18 2339

## 2018-10-18 NOTE — ED Triage Notes (Signed)
Per ems: Pt coming from home c/o shortness of breath that started a week ago and productive cough that started years ago with normal color.   Pt appears anxious and "only feels safe at the hospital". Pt c/o he cant breathe without 2L oxygen. Sats at 95 without.

## 2018-10-18 NOTE — ED Notes (Signed)
Pt verbalized discharge instructions and follow up care. EMS dispatched contacted regarding pt's cane and stated "pt left it on the front porch of his residence."

## 2018-10-19 ENCOUNTER — Telehealth: Payer: Self-pay | Admitting: Internal Medicine

## 2018-10-19 MED ORDER — AZITHROMYCIN 250 MG PO TABS: ORAL_TABLET | ORAL | 0 refills | Status: DC

## 2018-10-19 MED ORDER — ALBUTEROL SULFATE HFA 108 (90 BASE) MCG/ACT IN AERS
2.0000 | INHALATION_SPRAY | Freq: Once | RESPIRATORY_TRACT | Status: AC
  Administered 2018-10-19: 2 via RESPIRATORY_TRACT
  Filled 2018-10-19: qty 6.7

## 2018-10-19 NOTE — ED Notes (Signed)
Pt taken out to lobby using wheelchair to wait for a ride

## 2018-10-19 NOTE — Telephone Encounter (Signed)
Patient called answering service "emergency". He is getting a chest cold and asks Zpak to head it off. Plan- Zpak sent

## 2018-10-19 NOTE — ED Notes (Signed)
Verbal order from [facility], MD for inhaler and pt stated "cousin is picking him up"

## 2018-10-19 NOTE — ED Notes (Signed)
Pt stated "the doctor told me she was going to find me a ride home and get me an inhaler. I am not signing anything for discharge until I get my inhaler." Pt stated they do not have money for a cab and they will not have a ride until 7am. Pt not a candidate for PTAR.

## 2018-11-16 ENCOUNTER — Telehealth: Payer: Self-pay | Admitting: Internal Medicine

## 2018-11-16 MED ORDER — AZITHROMYCIN 250 MG PO TABS: ORAL_TABLET | ORAL | 0 refills | Status: DC

## 2018-11-16 NOTE — Telephone Encounter (Signed)
Spoke with and notified of recs per MW  He verbalized understanding  Rx for zpack was sent Pt will keep pending ov with all meds

## 2018-11-16 NOTE — Telephone Encounter (Signed)
Spoke with pt. States that he is not feeling well. Reports increased chest tightness, wheezing and coughing. Cough is producing mucus at times. Denies fever at this time. Symptoms started last week. States that MW wanted him to have a prescription for Doxy on stand by. Pt has not tried any OTC medications. He is taking all of his medications as prescribed.  MW - please advise. Thanks.

## 2018-11-16 NOTE — Telephone Encounter (Signed)
Appears to be confusion re pred rx   Should be prednisone 10 take x 2 until better then hold at 10 mg daily   zpak ok   Ov with all meds in hand as planned for 1/24

## 2018-11-16 NOTE — Telephone Encounter (Signed)
LMTCB

## 2018-11-16 NOTE — Telephone Encounter (Signed)
Pt is calling back [phone]

## 2018-12-04 ENCOUNTER — Telehealth: Payer: Self-pay | Admitting: Internal Medicine

## 2018-12-04 NOTE — Telephone Encounter (Signed)
If he has already done what's on his list of recs to do (which includes doubling the dose of prednisone until better) then I can't do better than that over the phone and he needs ov with all meds in hand and I note he's been in the ER twice since I last saw him with the concept we would help prevent this if he let us by coming in early rather than late

## 2018-12-04 NOTE — Telephone Encounter (Signed)
Spoke with patient and relayed Dr Gustavus Bryant recommendation of doubling his prednisone until he feels better.  Told pt when he comes back for his next OV to bring all of his meds with him.  Told pt if he does not feel better in a few days call back or seek emergency care.  Pt verbalized understanding.  Nothing further needed.

## 2018-12-04 NOTE — Telephone Encounter (Signed)
Primary Pulmonologist: Dr [name] Last office visit and with whom: 09/18/18, Dr [name] What do we see them for (pulmonary problems): COPD Last OV assessment/plan:      Instructions   Try prednisone 10 mg daily to see if you do just as well and if flares then double the dose until better  Only use your albuterol as a rescue medication to be used if you can't catch your breath by resting or doing a relaxed purse lip breathing pattern.  - The less you use it, the better it will work when you need it. - Ok to use up to 2 puffs  every 4 hours if you must but call for immediate appointment if use goes up over your usual need - Don't leave home without it !!  (think of it like the spare tire for your car)    Please use flutter valve in am to help with the morning congestion    Please schedule a follow up visit in 3 months but call sooner if needed - bring your medications and your med calendar         Was appointment offered to patient (explain)?  Patient declined. He stated that he is broke at this time, but pharmacy will deliver his meds and let him pay later. He has upcoming appt with Dr [name] 12/21/18.   Reason for call:  Patient complaining of increase cough, that did have green phlegm, but is now more clear, chest soreness from coughing, stuffy nose, and ears are stopped up. He has only been using his prescribe meds, nothing OTC.   Patient request [employer].  Message routed to Dr [name]

## 2018-12-14 ENCOUNTER — Telehealth: Payer: Self-pay

## 2018-12-14 MED ORDER — PREDNISONE 10 MG PO TABS: 10.0000 mg | ORAL_TABLET | Freq: Every day | ORAL | 0 refills | Status: DC

## 2018-12-14 NOTE — Telephone Encounter (Signed)
Call made to patient, patient states he last was told by MW to double his prednisone dose until feeling better, he is feeling better but does not have enough prednisone to last him through the month from doubling his dose. Per phone notes from MW patient was to take 10mg  daily and double when not feeling better. Patient has a OV 02/24. Prednisone 10mg  sent in, #10 to last him until his office visit and for this to be revisited at his office visit. Patient voiced understanding. Will route message to MW as Juluis Rainier. Nothing further is needed at this time.

## 2018-12-14 NOTE — Telephone Encounter (Signed)
Patient is requesting a refill on prednisone. States that MW told him to increase his dosage and now he will not have enough for the month. His chart does not reflect currently taking maintenance prednisone  Pharmacy: [facility].

## 2018-12-21 ENCOUNTER — Ambulatory Visit (INDEPENDENT_AMBULATORY_CARE_PROVIDER_SITE_OTHER): Payer: Medicaid Other | Admitting: Internal Medicine

## 2018-12-21 ENCOUNTER — Encounter: Payer: Self-pay | Admitting: Internal Medicine

## 2018-12-21 DIAGNOSIS — J9611 Chronic respiratory failure with hypoxia: Secondary | ICD-10-CM | POA: Diagnosis not present

## 2018-12-21 DIAGNOSIS — F17210 Nicotine dependence, cigarettes, uncomplicated: Secondary | ICD-10-CM | POA: Diagnosis not present

## 2018-12-21 DIAGNOSIS — J449 Chronic obstructive pulmonary disease, unspecified: Secondary | ICD-10-CM | POA: Diagnosis not present

## 2018-12-21 MED ORDER — PREDNISONE 10 MG PO TABS: ORAL_TABLET | ORAL | 0 refills | Status: DC

## 2018-12-21 MED ORDER — FLUTTER DEVI: 0 refills | Status: DC

## 2018-12-21 NOTE — Progress Notes (Signed)
Subjective:   Patient ID: DESHUN SEDIVY, male    DOB: [birth year]  MRN: [id number]    Brief patient profile:  8  yobm active smokker with breathing difficulties since 2004 dx as copd on advair 250 /50 but getting worse since 2014 and placed on 02 for the first time in March 2015 and referred 01/07/2014 to pulmonary clinic by Dr Kennon Holter with GOLD II copd criteria established 02/14/14    History of Present Illness  01/07/2014 1st [facility] office visit/ Kitty Cadavid  Chief Complaint  Patient presents with  . Advice Only    Referred for COPD, SOB.  Pt c/o SOB, low 02, prod cough with white mucous.  Pt forgot med list, unsure of what all he is taking  still can do a [employer] but uses [facility] parking, assoc with congested coughing esp in am worse in winter. rec Stop advair   Plan A =  Automatic =  symbicort 160 Take 2 puffs first thing in am and then another 2 puffs about 12 hours later. spiriva in am only  Plan B = Backup- Proventil use it only if you can't your breath - ok to use up to every 4 hours if needed Plan C = Nebulizer up to every 4 hours if needed only if you try plan B first and it doesn't work    01/30/2018  f/u ov/Lakeithia Rasor re: COPD GOLD II / anxiety  Chief Complaint  Patient presents with  . Follow-up    increased SOB x 1week,productive cough light green in color  Dyspnea:  X 50 ft on 2lpm  Cough: more freq x one week/ no pattern> has oxy but hasn't started yet  Sleep: on 02 2lpm @ 30 degrees and worse wheeze in am when rising SABA use:  saba hfa = neb  Including x 2 in am of ov  rec Pepcid 20 mg one at bedtime  See calendar for specific medication instructions      03/16/2018  f/u ov/Sheena Simonis re: GOLD II/ worse sob/ no med calendar  Chief Complaint  Patient presents with  . Follow-up    Breathing has been progressively worse since last visit. He is noticing dizziness when he gets SOB.  He has been using his proair 4-5 x per day and neb with albuterol 2 x daily. He has  noticed some swelling in his left foot over the past wk.    Dyspnea:  No better/ 50 ft gives out (walks fast pace)  Cough: mild hacking Sleep: 30 degrees and 2-3 lpm  SABA use:   Already this am used prednisone 10 mg x 2 / symb x 2/ tudorza / saba x 3 pffs so far  rec Due  to your copd and respiratory failure I recommend you stay home through Wednesay the 22nd to use your 02 and multiple pulmonary medications around the clock as prescribed  See calendar for specific medication instructions and bring it back for each and every office visit for every healthcare provider you see.  Without it,  you may not receive the best quality medical care that we feel you deserve.    04/28/18  NP recs Check CXR and sputum culture Prednisone taper (4,4,4,3,3,3,2,2,2,); then continue as prescribed  Use neb treatments as needed for wheeze/cough  (do not duplicate with albuterol inhaler) Takes doxycyline per PCP, monitor how frequently you are using this. If taken regularly you could develop antibiotic resistance.  We advise that you stop smoking (do not smoke with  oxygen tank on) PLEASE bring in medications to next apt  Follow-up in 6 weeks      06/10/2018  f/u ov/Lani Havlik re: post hosp f/u - did not bring meds as req / not using 02 as rec (uses p sob with activity) Chief Complaint  Patient presents with  . Follow-up    Breathing has improved some. He has d/ced alot of his meds and feels this has helped. He is using his neb 2 x daily and albuterol inhaler once daily.   Dyspnea:  MMRC3 = can't walk 100 yards even at a slow pace at a flat grade s stopping due to sob  Cough: clear mucus worse in am SABA use: puffer << neb (not the instructions given)  02:   2 lpm hs and daytime prn  Last pred x 4 days prior to OV   taken at bedtime rec Plan A = Automatic = symbicort 160 Take 2 puffs first thing in am and then another 2 puffs about 12 hours later along with tudorza one twice daily  Plan B = Backup Only use  your albuterol inhaler (Proair) as a rescue medication Plan C = Crisis - only use your albuterol nebulizer if you first try Plan B and it fails to help > ok to use the nebulizer up to every 4 hours but if start needing it regularly call for immediate appointment Use 02 to keep your saturations over 90%  > ok to adjust upwards if needed    06/24/18 NP eval/ recs: May use saline spray for nasal dryness/congestion continue current medications Medication calender updated Flu vaccine today Follow up in 1 month with Dr. [name] - bring all medications Please try to quit smoking     07/27/2018  f/u ov/Elias Bordner re: copd/ did not bring med calendar or all meds/ still smoking / prednisone 20 mg daily  Chief Complaint  Patient presents with  . Follow-up    States his breathing is about the same but when it is hot it gets much worse.   Dyspnea:  Across room even on 02 sob unless uses nebs / MMRC4  = sob if tries to leave home or while getting dressed   Cough: clear / rattling Sleeping: < 30 degrees from multiple pillows SABA use: way over using 02: 02 2lpm   rec For cough / congestion > mucinex up to 1200 mg every 12 hours and use the flutter valve as much as you can Try off tudorza and start back on spiriva 2.5  X 2 pffs each am  Call if unable to get back into [facility] rehab  Please schedule a follow up office visit in 6 weeks, call sooner if needed with all medications /inhalers/ solutions in hand   08/12/18 ov NP rec Will order pulmonary rehab Will reorder Spiriva Patient has tried other inhalers and does not have the inspiratory capacity to inhale dry powder.  Continue Symbicort and albuterol    09/18/2018  f/u ov/Darryll Raju re:  GOLD III copd / prednisone 20 mg / did not bring med cal as req maint on symb 160/spiriva smi Chief Complaint  Patient presents with  . Follow-up    Breathing is doing well. He is using his albuterol inhaler 4-5 per day because he states he has been walking more.  He uses his neb 3 to 4 x per wk.    Dyspnea:  Can't do [employer] s stopping on 1lpm but doesn't check sats or titrate up  Cough: esp  in am x  first hour but mostly white and < a tbsp or two produced  Sleeping: ok flat  SABA use: as above 02: sleeping on 1.5- 2lpm and never above 1lpm walking  rec Try prednisone 10 mg daily to see if you do just as well and if flares then double the dose until better Only use your albuterol as a rescue medication  Please use flutter valve in am to help with the morning congestion     12/21/2018  f/u ov/Jaclyn Andy re:  GOLD III copd/ pred 30 mg daily with floor 10 mg on symb 160/spiriva  Chief Complaint  Patient presents with  . Follow-up    Pt c/o sinus pressure off and on for the past wk. He is having some increased SOB this morning.    Dyspnea:  Can do shopping at grocery on 2lpm does not check sats  Cough: usual cough = rattling  Sleeping: bed is flat / pillows up to 30 degrees  SABA use: up to q 6 hours on neb  02: 2lpm    No obvious day to day or daytime variability or assoc excess/ purulent sputum or mucus plugs or hemoptysis or cp or chest tightness, subjective wheeze or overt  hb symptoms.   Sleeping as above  without nocturnal  or early am exacerbation  of respiratory  c/o's or need for noct saba. Also denies any obvious fluctuation of symptoms with weather or environmental changes or other aggravating or alleviating factors except as outlined above   No unusual exposure hx or h/o childhood pna/ asthma or knowledge of premature birth.  Current Allergies, Complete Past Medical History, Past Surgical History, Family History, and Social History were reviewed in [employer] record.  ROS  The following are not active complaints unless bolded Hoarseness, sore throat, dysphagia, dental problems, itching, sneezing,  nasal congestion or discharge of excess mucus or purulent secretions, ear ache,   fever, chills, sweats, unintended wt  loss or wt gain, classically pleuritic or exertional cp,  orthopnea pnd or arm/hand swelling  or leg swelling, presyncope, palpitations, abdominal pain, anorexia, nausea, vomiting, diarrhea  or change in bowel habits or change in bladder habits, change in stools or change in urine, dysuria, hematuria,  rash, arthralgias, visual complaints, headache, numbness, weakness or ataxia or problems with walking or coordination,  change in mood or  memory.        Current Meds  Medication Sig  . albuterol (PROAIR HFA) 108 (90 Base) MCG/ACT inhaler Inhale 1-2 puffs into the lungs every 4 (four) hours as needed. INHALE TWO PUFFS UP TO EVERY 4 HOURS IF NEEDED FOR SHORT OF BREATH  . albuterol (PROVENTIL) (2.5 MG/3ML) 0.083% nebulizer solution Take 3 mLs (2.5 mg total) by nebulization every 4 (four) hours as needed for wheezing or shortness of breath.  Marland Kitchen amLODipine (NORVASC) 10 MG tablet Take 10 mg by mouth daily.   . benztropine (COGENTIN) 0.5 MG tablet Take 0.5 mg by mouth at bedtime.   . budesonide-formoterol (SYMBICORT) 160-4.5 MCG/ACT inhaler INHALE TWO PUFFS INTO THE LUNGS TWO (TWO) TIMES DAILY. (Patient taking differently: Inhale 2 puffs into the lungs 2 (two) times daily. Marland Kitchen)  . cetirizine (ZYRTEC) 10 MG tablet Take 10 mg by mouth daily.  Marland Kitchen docusate sodium (COLACE) 100 MG capsule Take 100 mg by mouth daily.   . famotidine (PEPCID) 20 MG tablet Take 20 mg by mouth at bedtime.  . fluticasone (FLONASE) 50 MCG/ACT nasal spray Place 2 sprays into  both nostrils 3 (three) times daily as needed for allergies.   Marland Kitchen guaiFENesin (MUCINEX) 600 MG 12 hr tablet Take 1 tablet (600 mg total) by mouth 2 (two) times daily.  Marland Kitchen losartan (COZAAR) 100 MG tablet Take 100 mg by mouth daily.  Marland Kitchen loxapine (LOXITANE) 10 MG capsule Take 10 mg by mouth at bedtime.   . mirtazapine (REMERON) 30 MG tablet Take 30 mg by mouth at bedtime.  . Multiple Vitamin (MULTIVITAMIN) capsule Take 1 capsule by mouth daily.  . nicotine (NICODERM CQ -  DOSED IN MG/24 HOURS) 14 mg/24hr patch Place 1 patch (14 mg total) onto the skin daily.  . OXYGEN Inhale 2 L into the lungs as directed. 2-3 liters with sleep and exertion as needed for low oxygen  . pantoprazole (PROTONIX) 20 MG tablet Take 1 tablet (20 mg total) by mouth daily.  . predniSONE (DELTASONE) 10 MG tablet Take 1 tablet (10 mg total) by mouth daily for 10 doses. Take 10mg  daily (Patient taking differently: Take 10-20 mg by mouth daily. As directed)  . Respiratory Therapy Supplies (FLUTTER) DEVI Use as directed  . risperiDONE (RISPERDAL) 2 MG tablet Take 2 mg by mouth at bedtime.  . Tiotropium Bromide Monohydrate (SPIRIVA RESPIMAT) 2.5 MCG/ACT AERS 2 pffs each  am (Patient taking differently: Inhale 2 puffs into the lungs daily. )                          Objective:   Physical Exam  amb bm nad   06/15/2014  164  >  09/15/2014 158 >   02/20/2015 >156 03/22/2015 >  07/31/2015   150 > 12/05/2015  161   > 04/01/2016    151 > 07/29/2016  152 >   02/25/2017  153 >  06/05/2017  153 > 11/03/2017   166 > 01/30/2018  167> 03/16/2018  168 > 06/10/2018  167 > 07/27/2018  169 > 09/18/2018 163 > 12/21/2018    163   Vital signs reviewed - Note on arrival 02 sats  95%  on 2lpm continuous        HEENT: nl dentition / oropharynx. Nl external ear canals without cough reflex -  Mild  bilateral non-specific turbinate edema     NECK :  without JVD/Nodes/TM/ nl carotid upstrokes bilaterally   LUNGS: no acc muscle use,  Mod  barrel  contour chest wall with bilateral  Distant bs s audible wheeze and  without cough on insp or exp maneuver and mod   Hyperresonant  to  percussion bilaterally     CV:  RRR  no s3 or murmur or increase in P2, and no edema   ABD:  soft and nontender with pos mid  insp Hoover's  in the supine position. No bruits or organomegaly appreciated, bowel sounds nl  MS:   Nl gait/  ext warm without deformities, calf tenderness, cyanosis or clubbing No obvious joint restrictions   SKIN:  warm and dry without lesions    NEURO:  alert, approp, nl sensorium with  no motor or cerebellar deficits apparent.

## 2018-12-21 NOTE — Patient Instructions (Addendum)
Plan A = Automatic = symbicort 160 Take 2 puffs first thing in am and then another 2 puffs about 12 hours later/spiriva 2.5  X 3 puffs each am   Work on inhaler technique:  relax and gently blow all the way out then take a nice smooth deep breath back in, triggering the inhaler at same time you start breathing in.  Hold for up to 5 seconds if you can. Blow out symbiocrt  thru nose. Rinse and gargle with water when done   Plan B = Backup Only use your albuterol inhaler as a rescue medication to be used if you can't catch your breath by resting or doing a relaxed purse lip breathing pattern.  - The less you use it, the better it will work when you need it. - Ok to use the inhaler up to 2 puffs  every 4 hours if you must but call for appointment if use goes up over your usual need - Don't leave home without it !!  (think of it like the spare tire for your car)   Plan C = Crisis - only use your albuterol nebulizer if you first try Plan B and it fails to help > ok to use the nebulizer up to every 4 hours but if start needing it regularly call for immediate appointment   Plan D = Deltasone  Take 2 daily until better then 1 daily     See Tammy NP w/in 2 weeks with all your medications, even over the counter meds, separated in two separate bags, the ones you take no matter what vs the ones you stop once you feel better and take only as needed when you feel you need them.   Tammy  will generate for you a new user friendly medication calendar that will put [place] all on the same page re: your medication use.     Without this process, it simply isn't possible to assure that we are providing  your outpatient care  with  the attention to detail we feel you deserve.   If we cannot assure that you're getting that kind of care,  then we cannot manage your problem effectively from this clinic.  Once you have seen Tammy and we are sure that we're all on the same page with your medication use she will arrange  follow up with me with full pfts on return  Add:  See where he is with rehab issue which have been rec previously

## 2018-12-22 ENCOUNTER — Telehealth: Payer: Self-pay | Admitting: Internal Medicine

## 2018-12-22 MED ORDER — PREDNISONE 10 MG PO TABS: ORAL_TABLET | ORAL | 0 refills | Status: DC

## 2018-12-22 NOTE — Telephone Encounter (Signed)
It's fine to write it like that for the pharmacy  but he needs to follow the recs from last ov which should read 2 until better then 1 daily as listed

## 2018-12-22 NOTE — Telephone Encounter (Signed)
Spoke with patient. He is aware of MW's recs. Will go ahead and send in new RX. Nothing further needed at time of call.

## 2018-12-22 NOTE — Telephone Encounter (Signed)
Called and spoke with Patient. Patient stated that he has 2 prednisone tabs left,and can not get a new prescription until next month. He stated that Dr [name] had him go up on his Prednisone.  He stated pharmacy told him the prescription needs to read 2-4 tabs, so he can get his refill and insurance will cover it. Previous prescription 12/21/18, take 2 daily until better, #100, to [facility].  12/21/18-OV        Instructions   Plan A = Automatic = symbicort 160 Take 2 puffs first thing in am and then another 2 puffs about 12 hours later/spiriva 2.5  X 3 puffs each am   Work on inhaler technique:  relax and gently blow all the way out then take a nice smooth deep breath back in, triggering the inhaler at same time you start breathing in.  Hold for up to 5 seconds if you can. Blow out symbiocrt  thru nose. Rinse and gargle with water when done   Plan B = Backup Only use your albuterol inhaler as a rescue medication to be used if you can't catch your breath by resting or doing a relaxed purse lip breathing pattern.  - The less you use it, the better it will work when you need it. - Ok to use the inhaler up to 2 puffs  every 4 hours if you must but call for appointment if use goes up over your usual need - Don't leave home without it !!  (think of it like the spare tire for your car)   Plan C = Crisis - only use your albuterol nebulizer if you first try Plan B and it fails to help > ok to use the nebulizer up to every 4 hours but if start needing it regularly call for immediate appointment   Plan D = Deltasone  Take 2 daily until better then 1 daily     See Tammy NP w/in 2 weeks with all your medications, even over the counter meds, separated in two separate bags, the ones you take no matter what vs the ones you stop once you feel better and take only as needed when you feel you need them.   Tammy  will generate for you a new user friendly medication calendar that will put [place]  all on the same page re: your medication use.     Without this process, it simply isn't possible to assure that we are providing  your outpatient care  with  the attention to detail we feel you deserve.   If we cannot assure that you're getting that kind of care,  then we cannot manage your problem effectively from this clinic.  Once you have seen Tammy and we are sure that we're all on the same page with your medication use she will arrange follow up with me with full pfts on return      Message routed to Dr [name]

## 2018-12-22 NOTE — Telephone Encounter (Signed)
Patient asking [place] to call him back soon as he is out of prednisone, CB is [phone].

## 2018-12-27 ENCOUNTER — Encounter: Payer: Self-pay | Admitting: Internal Medicine

## 2018-12-27 NOTE — Assessment & Plan Note (Signed)

## 2018-12-27 NOTE — Assessment & Plan Note (Signed)
First started on 27 December 2013 by [facility] clinic - 02/14/2014  Walked RA x 3/4  laps @ 185 ft each stopped due to  desat 85% and corrected on 3lpm to x 2 laps  - 06/15/2014  Walked 2.5lpm  2 laps  @ 185 ft each stopped due to  Sob adequate sats  - 02/20/2015  Walked RA x 3 laps @ 185 ft each stopped due to  Sob/desat p 2 laps, nl pace, eliminated on his 02 at 3lpm  - 12/05/2015   Walked RA  2 laps @ 185 ft each stopped due to  desat to 88% and corrected on 2lpm  - 03/16/2018   Walked 3lmpm  x one lap @ 185 stopped due to /desat  To 89% at relatively fast pace  - 06/10/2018   Walked RA x one lap @ 185 stopped due to  Sob / desats to 86% resolved on repeating walk on 2lpm but still sob p one lap  - 07/27/2018  Walked 2lpm continuous x 2 laps @ 185 ft each stopped due to  End of study, nl pace, no desats but stopped due to sob - 09/18/2018   Walked 0.5 lpm  x one lap @ 1=210 ft stopped due to  Sob s desats   rec as of 12/21/2018  2lpm hs and prn daytime with goal of > 90% at all times but if does not have oximetry just use 1lpm and bring his own equipment with him at next ov to verify it is adequate

## 2018-12-27 NOTE — Assessment & Plan Note (Signed)
Active smoker  - 02/14/2014   try symbicort 160 2bid  - PFT's  02/14/2014   FEV1 1.82 (53%) and ratio 47 no better p alb (p spiriva and symbicort 160)  and DLCO 32 with 37%  07/2014 >Spiriva stopped  09/15/2014 Med calendar> not using 02/20/2015   - Declined rehab 07/31/15    - flutter valve 04/01/2016  -med calendar 08/20/16  - added prn pred to action plan 12/05/2016  - changed to maint pred 02/25/2017 with floor of 10 mg daily >>>  - 06/05/2017  After extensive coaching device effectiveness =    90% smi > change tudorza to spiriva 2.5 x 2 > unable to afford - 11/03/2017  After extensive coaching inhaler device  effectiveness =    90% with DPI/ tudorza  - add pepcid 20 mg at hs 01/30/2018 for noct/ am wheeze - Spirometry 03/16/2018  FEV1 1.16 (34%)  Ratio 57 p am symb/ tudorza - referred again to rehab 06/10/2018  And 07/27/2018  > declined 09/18/2018  - 07/27/2018  After extensive coaching inhaler device,  effectiveness =    90% with respimat  - 09/18/2018 flutter valve training/ review  - 12/21/2018  After extensive coaching inhaler device,  effectiveness =    75% (short Ti)   Active smoking and for now steroid dependent = Group D in terms of symptom/risk and laba/lama/ICS  therefore appropriate rx at this point >>>  Continue symb / spiriva and again consider pulmonary rehab when returns for medication reconciliation    To keep things simple, I have asked the patient to first separate medicines that are perceived as maintenance, that is to be taken daily "no matter what", from those medicines that are taken on only on an as-needed basis and I have given the patient examples of both, and then return to see our NP to generate a  detailed  medication calendar which should be followed until the next physician sees the patient and updates it.

## 2018-12-27 NOTE — Assessment & Plan Note (Addendum)
First started on 27 December 2013 by [facility] clinic - 02/14/2014  Walked RA x 3/4  laps @ 185 ft each stopped due to  desat 85% and corrected on 3lpm to x 2 laps  - 06/15/2014  Walked 2.5lpm  2 laps  @ 185 ft each stopped due to  Sob adequate sats  - 02/20/2015  Walked RA x 3 laps @ 185 ft each stopped due to  Sob/desat p 2 laps, nl pace, eliminated on his 02 at 3lpm  - 12/05/2015   Walked RA  2 laps @ 185 ft each stopped due to  desat to 88% and corrected on 2lpm  - 03/16/2018   Walked 3lmpm  x one lap @ 185 stopped due to /desat  To 89% at relatively fast pace  - 06/10/2018   Walked RA x one lap @ 185 stopped due to  Sob / desats to 86% resolved on repeating walk on 2lpm but still sob p one lap  - 07/27/2018  Walked 2lpm continuous x 2 laps @ 185 ft each stopped due to  End of study, nl pace, no desats but stopped due to sob - 09/18/2018   Walked 0.5 lpm  x one lap @ 1=210 ft stopped due to  Sob s desats

## 2019-01-04 ENCOUNTER — Encounter: Payer: Medicaid Other | Admitting: Adult Health

## 2019-01-08 ENCOUNTER — Encounter: Payer: Medicaid Other | Admitting: Adult Health

## 2019-01-15 ENCOUNTER — Telehealth: Payer: Self-pay | Admitting: Adult Health

## 2019-01-15 NOTE — Telephone Encounter (Signed)
Pt is returning call. Cb is [phone]

## 2019-01-15 NOTE — Telephone Encounter (Signed)
Unable to reach LMTCB 

## 2019-01-15 NOTE — Telephone Encounter (Signed)
Called patient unable to reach LMTCB 

## 2019-01-15 NOTE — Telephone Encounter (Signed)
Called patient x3 unable to reach [facility]

## 2019-01-15 NOTE — Telephone Encounter (Signed)
Patient is returning phone call.  Patient phone number is [phone].

## 2019-01-18 MED ORDER — AZITHROMYCIN 250 MG PO TABS: 250.0000 mg | ORAL_TABLET | ORAL | 0 refills | Status: DC

## 2019-01-18 NOTE — Telephone Encounter (Signed)
Called and spoke with patient he is aware and verbalized understanding. Order sent. Nothing further needed.

## 2019-01-18 NOTE — Telephone Encounter (Signed)
Ok for zpak  - follow instructions under "plan D" in meantime

## 2019-01-18 NOTE — Telephone Encounter (Signed)
Primary Pulmonologist: MW Last office visit and with whom: 12/21/18 MW What do we see them for (pulmonary problems): COPD Gold III Last OV assessment/plan: 12/21/18-OV       Instructions   Plan A = Automatic =symbicort 160Take 2 puffsfirst thing in amand then another 2 puffs about 12 hours later/spiriva 2.5 X 3 puffs each am   Work on inhaler technique: relax and gently blow all the way out then take a nice smooth deep breath back in, triggering the inhaler at same time you start breathing in. Hold for up to 5 seconds if you can. Blow out symbiocrtthru nose. Rinse and gargle with water when done   Plan B = Backup Only use your albuterol inhaler as a rescue medication to be used if you can't catch your breath by resting or doing a relaxed purse lip breathing pattern.  - The less you use it, the better it will work when you need it. - Ok to use the inhaler up to 2 puffs every 4 hours if you must but call for appointment if use goes up over your usual need - Don't leave home without it !! (think of it like the spare tire for your car)   Plan C = Crisis - only use your albuterol nebulizer if you first try Plan B and it fails to help >ok to use the nebulizer up to every 4 hours but if start needing it regularly call for immediate appointment   Plan D = Deltasone  Take 2 daily until better then 1 daily    See Tammy NP w/in 2 weeks with allyour medications, even over the counter meds, separated in two separate bags, the ones you take no matter what vs the ones you stop once you feel better and take only as needed when you feel you need them. Tammy will generate for you a new user friendly medication calendar that will put [place] all on the same page re: your medication use.    Without this process, it simply isn't possible to assure that we are providing your outpatient care with theattention to detail we feel you deserve. If we cannot assure that you're  getting that kind of care, then we cannot manage your problem effectively from this clinic.  Once you have seen Tammy and we are sure that we're all on the same page with your medication use she will arrange follow up with mewith full pfts on return     Reason for call: Patient reports chest tightness, productive cough-clear, increase of SOB and some wheezing. Pt has no fever, no chills, no body aches or head aches at this time. Pt states he has been around mold, and feels he is in need of a Z-Pak called into his pharmacy. Pt wants to see if MW will send in Z-Pak today.  MW please advise, thank you.

## 2019-01-22 ENCOUNTER — Telehealth: Payer: Self-pay | Admitting: Internal Medicine

## 2019-01-22 NOTE — Telephone Encounter (Signed)
Spoke with pt. He is not currently having any issues with his breathing. States that his shortness of breath and cough are like they normally are. He is very worried about COVID-19 and wanted some information on it. I have advised him to wash his hand, stay at home unless it's absolutely necessary to go out, stay away from people who are sick. Pt was also advised of the symptoms of COVID-19 and to call us back if he started to having increased issues with his breathing. Nothing further was needed at this time.

## 2019-01-22 NOTE — Telephone Encounter (Signed)
LMTCB x1 for pt.  

## 2019-02-01 ENCOUNTER — Telehealth: Payer: Self-pay | Admitting: Pulmonary Disease

## 2019-02-01 NOTE — Telephone Encounter (Signed)
Called Tony Sullivan in response to his call to answering service. No answer. Call went into voicemail. Message left to return call to answering service if he still required assistance.

## 2019-02-01 NOTE — Telephone Encounter (Signed)
Tony Sullivan called me back and relates a history of chest pressure and L arm discomfort on exertion which is troubling for cardiac involvement. Also relates a history of fatigue, sore throat, SOB, increase O2 requirement and headache which is troubling for COVID-19. I recommended that he come to the Emergency Department for evaluation and possible hospital admission.

## 2019-02-02 ENCOUNTER — Telehealth: Payer: Self-pay | Admitting: Internal Medicine

## 2019-02-02 NOTE — Telephone Encounter (Signed)
lmom 

## 2019-02-03 ENCOUNTER — Ambulatory Visit (INDEPENDENT_AMBULATORY_CARE_PROVIDER_SITE_OTHER): Payer: Medicaid Other | Admitting: Internal Medicine

## 2019-02-03 ENCOUNTER — Emergency Department (HOSPITAL_COMMUNITY): Payer: Medicaid Other

## 2019-02-03 ENCOUNTER — Emergency Department (HOSPITAL_COMMUNITY)
Admission: EM | Admit: 2019-02-03 | Discharge: 2019-02-04 | Disposition: A | Discharge status: Home / Self Care | Payer: Medicaid Other | Attending: Emergency Medicine | Admitting: Emergency Medicine

## 2019-02-03 ENCOUNTER — Other Ambulatory Visit: Payer: Self-pay

## 2019-02-03 ENCOUNTER — Encounter (HOSPITAL_COMMUNITY): Payer: Self-pay | Admitting: Emergency Medicine

## 2019-02-03 DIAGNOSIS — J449 Chronic obstructive pulmonary disease, unspecified: Secondary | ICD-10-CM

## 2019-02-03 DIAGNOSIS — J9611 Chronic respiratory failure with hypoxia: Secondary | ICD-10-CM | POA: Diagnosis not present

## 2019-02-03 DIAGNOSIS — Y939 Activity, unspecified: Secondary | ICD-10-CM | POA: Insufficient documentation

## 2019-02-03 DIAGNOSIS — Y929 Unspecified place or not applicable: Secondary | ICD-10-CM | POA: Diagnosis not present

## 2019-02-03 DIAGNOSIS — I10 Essential (primary) hypertension: Secondary | ICD-10-CM | POA: Insufficient documentation

## 2019-02-03 DIAGNOSIS — Z79899 Other long term (current) drug therapy: Secondary | ICD-10-CM | POA: Diagnosis not present

## 2019-02-03 DIAGNOSIS — F17210 Nicotine dependence, cigarettes, uncomplicated: Secondary | ICD-10-CM | POA: Diagnosis not present

## 2019-02-03 DIAGNOSIS — T2014XA Burn of first degree of nose (septum), initial encounter: Secondary | ICD-10-CM | POA: Insufficient documentation

## 2019-02-03 DIAGNOSIS — Y999 Unspecified external cause status: Secondary | ICD-10-CM | POA: Insufficient documentation

## 2019-02-03 DIAGNOSIS — R0789 Other chest pain: Secondary | ICD-10-CM | POA: Diagnosis not present

## 2019-02-03 DIAGNOSIS — X088XXA Exposure to other specified smoke, fire and flames, initial encounter: Secondary | ICD-10-CM | POA: Diagnosis not present

## 2019-02-03 DIAGNOSIS — T310 Burns involving less than 10% of body surface: Secondary | ICD-10-CM | POA: Diagnosis not present

## 2019-02-03 DIAGNOSIS — T2000XA Burn of unspecified degree of head, face, and neck, unspecified site, initial encounter: Secondary | ICD-10-CM
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
## 2019-02-03 MED ORDER — BACITRACIN ZINC 500 UNIT/GM EX OINT
TOPICAL_OINTMENT | Freq: Once | CUTANEOUS | Status: AC
  Administered 2019-02-04: 01:00:00 via TOPICAL

## 2019-02-03 MED ORDER — ALBUTEROL SULFATE HFA 108 (90 BASE) MCG/ACT IN AERS
2.0000 | INHALATION_SPRAY | Freq: Once | RESPIRATORY_TRACT | Status: AC
  Administered 2019-02-03: 2 via RESPIRATORY_TRACT
  Filled 2019-02-03: qty 6.7

## 2019-02-03 MED ORDER — ALBUTEROL SULFATE HFA 108 (90 BASE) MCG/ACT IN AERS
INHALATION_SPRAY | RESPIRATORY_TRACT | Status: AC
  Filled 2019-02-03: qty 6.7

## 2019-02-03 MED ORDER — SALINE SPRAY 0.65 % NA SOLN: 2.0000 | Freq: Every day | NASAL | 1 refills | Status: DC

## 2019-02-03 MED ORDER — ALBUTEROL SULFATE HFA 108 (90 BASE) MCG/ACT IN AERS
1.0000 | INHALATION_SPRAY | Freq: Once | RESPIRATORY_TRACT | Status: AC
  Administered 2019-02-03: 1 via RESPIRATORY_TRACT
  Filled 2019-02-03: qty 6.7

## 2019-02-03 MED ORDER — BACITRACIN ZINC 500 UNIT/GM EX OINT: 1.0000 "application " | TOPICAL_OINTMENT | Freq: Two times a day (BID) | CUTANEOUS | 0 refills | Status: DC

## 2019-02-03 MED ORDER — AEROCHAMBER PLUS FLO-VU LARGE MISC
1.0000 | Freq: Once | Status: AC
  Administered 2019-02-03: 1

## 2019-02-03 MED ORDER — PANTOPRAZOLE SODIUM 40 MG PO TBEC: 40.0000 mg | DELAYED_RELEASE_TABLET | Freq: Every day | ORAL | 2 refills | Status: DC

## 2019-02-03 NOTE — Progress Notes (Signed)
Subjective:   Patient ID: Tony Sullivan, male    DOB: 25-Jan-1961  MRN: [id number]    Brief patient profile:  67  yobm active smoker with breathing difficulties since 2004 dx as copd on advair 250 /50 but getting worse since 2014 and placed on 02 for the first time in March 2015 and referred 01/07/2014 to pulmonary clinic by Dr Kennon Holter with GOLD II copd criteria established 02/14/14    History of Present Illness  01/07/2014 [facility] office visit/ Tony Sullivan  Chief Complaint  Patient presents with  . Advice Only    Referred for COPD, SOB.  Pt c/o SOB, low 02, prod cough with white mucous.  Pt forgot med list, unsure of what all he is taking  still can do a [employer] but uses [employer] parking, assoc with congested coughing esp in am worse in winter. rec Stop advair   Plan A =  Automatic =  symbicort 160 Take 2 puffs first thing in am and then another 2 puffs about 12 hours later. spiriva in am only  Plan B = Backup- Proventil use it only if you can't your breath - ok to use up to every 4 hours if needed Plan C = Nebulizer up to every 4 hours if needed only if you try plan B first and it doesn't work    01/30/2018  f/u ov/Tony Sullivan re: COPD GOLD II / anxiety  Chief Complaint  Patient presents with  . Follow-up    increased SOB x 1week,productive cough light green in color  Dyspnea:  X 50 ft on 2lpm  Cough: more freq x one week/ no pattern> has oxy but hasn't started yet  Sleep: on 02 2lpm @ 30 degrees and worse wheeze in am when rising SABA use:  saba hfa = neb  Including x 2 in am of ov  rec Pepcid 20 mg one at bedtime  See calendar for specific medication instructions      03/16/2018  f/u ov/Tony Sullivan re: GOLD II/ worse sob/ no med calendar  Chief Complaint  Patient presents with  . Follow-up    Breathing has been progressively worse since last visit. He is noticing dizziness when he gets SOB.  He has been using his proair 4-5 x per day and neb with albuterol 2 x daily. He has  noticed some swelling in his left foot over the past wk.    Dyspnea:  No better/ 50 ft gives out (walks fast pace)  Cough: mild hacking Sleep: 30 degrees and 2-3 lpm  SABA use:   Already this am used prednisone 10 mg x 2 / symb x 2/ tudorza / saba x 3 pffs so far  rec Due  to your copd and respiratory failure I recommend you stay home through Wednesay the 22nd to use your 02 and multiple pulmonary medications around the Sullivan as prescribed  See calendar for specific medication instructions and bring it back for each and every office visit for every healthcare provider you see.  Without it,  you may not receive the best quality medical care that we feel you deserve.    04/28/18  NP recs Check CXR and sputum culture Prednisone taper (4,4,4,3,3,3,2,2,2,); then continue as prescribed  Use neb treatments as needed for wheeze/cough  (do not duplicate with albuterol inhaler) Takes doxycyline per PCP, monitor how frequently you are using this. If taken regularly you could develop antibiotic resistance.  We advise that you stop smoking (do not smoke with  oxygen tank on) PLEASE bring in medications to next apt  Follow-up in 6 weeks      06/10/2018  f/u ov/Tony Sullivan re: post hosp f/u - did not bring meds as req / not using 02 as rec (uses p sob with activity) Chief Complaint  Patient presents with  . Follow-up    Breathing has improved some. He has d/ced alot of his meds and feels this has helped. He is using his neb 2 x daily and albuterol inhaler once daily.   Dyspnea:  MMRC3 = can't walk 100 yards even at a slow pace at a flat grade s stopping due to sob  Cough: clear mucus worse in am SABA use: puffer << neb (not the instructions given)  02:   2 lpm hs and daytime prn  Last pred x 4 days prior to OV   taken at bedtime rec Plan A = Automatic = symbicort 160 Take 2 puffs first thing in am and then another 2 puffs about 12 hours later along with tudorza one twice daily  Plan B = Backup Only use  your albuterol inhaler (Proair) as a rescue medication Plan C = Crisis - only use your albuterol nebulizer if you first try Plan B and it fails to help > ok to use the nebulizer up to every 4 hours but if start needing it regularly call for immediate appointment Use 02 to keep your saturations over 90%  > ok to adjust upwards if needed    06/24/18 NP eval/ recs: May use saline spray for nasal dryness/congestion continue current medications Medication calender updated Flu vaccine today Follow up in 1 month with Dr. [name] - bring all medications Please try to quit smoking     07/27/2018  f/u ov/Tony Sullivan re: copd/ did not bring med calendar or all meds/ still smoking / prednisone 20 mg daily  Chief Complaint  Patient presents with  . Follow-up    States his breathing is about the same but when it is hot it gets much worse.   Dyspnea:  Across room even on 02 sob unless uses nebs / MMRC4  = sob if tries to leave home or while getting dressed   Cough: clear / rattling Sleeping: < 30 degrees from multiple pillows SABA use: way over using 02: 02 2lpm   rec For cough / congestion > mucinex up to 1200 mg every 12 hours and use the flutter valve as much as you can Try off tudorza and start back on spiriva 2.5  X 2 pffs each am  Call if unable to get back into [facility] rehab  Please schedule a follow up office visit in 6 weeks, call sooner if needed with all medications /inhalers/ solutions in hand   08/12/18 ov NP rec Will order pulmonary rehab Will reorder Spiriva Patient has tried other inhalers and does not have the inspiratory capacity to inhale dry powder.  Continue Symbicort and albuterol    09/18/2018  f/u ov/Tony Sullivan re:  GOLD III copd / prednisone 20 mg / did not bring med cal as req maint on symb 160/spiriva smi Chief Complaint  Patient presents with  . Follow-up    Breathing is doing well. He is using his albuterol inhaler 4-5 per day because he states he has been walking more.  He uses his neb 3 to 4 x per wk.    Dyspnea:  Can't do [employer] s stopping on 1lpm but doesn't check sats or titrate up  Cough: esp  in am x  first hour but mostly white and < a tbsp or two produced  Sleeping: ok flat  SABA use: as above 02: sleeping on 1.5- 2lpm and never above 1lpm walking  rec Try prednisone 10 mg daily to see if you do just as well and if flares then double the dose until better Only use your albuterol as a rescue medication  Please use flutter valve in am to help with the morning congestion     12/21/2018  f/u ov/Tony Sullivan re:  GOLD III copd/ pred 30 mg daily with floor 10 mg on symb 160/spiriva  Chief Complaint  Patient presents with  . Follow-up    Pt c/o sinus pressure off and on for the past wk. He is having some increased SOB this morning.    Dyspnea:  Can do shopping at grocery on 2lpm does not check sats  Cough: usual cough = rattling  Sleeping: bed is flat / pillows up to 30 degrees  SABA use: up to q 6 hours on neb  02: 2lpm rec Plan A = Automatic = Symbicort 160 Take 2 puffs first thing in am and then another 2 puffs about 12 hours later/spiriva 2.5  X 3 puffs each am  Work on inhaler technique:  Plan B = Backup Only use your albuterol inhaler as a rescue medication    Plan C = Crisis - only use your albuterol nebulizer if you first try Plan B  Plan D = Deltasone  Take 2 daily until better then 1 daily  Add:  See where he is with rehab issue which have been rec previously     Virtual Visit via Telephone Note 02/03/2019   I connected with Tony Sullivan on 02/03/19 at  2:45 PM EDT by telephone and verified that I am speaking with the correct person using two identifiers.   I discussed the limitations, risks, security and privacy concerns of performing an evaluation and management service by telephone and the availability of in person appointments. I also discussed with the patient that there may be a patient responsible charge related to this service.  The patient expressed understanding and agreed to proceed.   History of Present Illness:   On daily prednisone 10 mg x 2 but taking second dose at bedtime and still smoking but "working on it"   Dyspnea:  Ex x 15 min x 4 per day  Walking and steps  Cough: nothing unusual  Sleeping: 30 degrees  SABA use: neb qod  02: 2lpm prn but not checking his sats as rec Fleeting L arm discomfort fleeting "few secs" not related to exertion  Also chest tightness that doesn't related to exertion and seems worse when abd swells up.  No assoc n or v or diaph    No obvious day to day or daytime variability or assoc excess/ purulent sputum or mucus plugs or hemoptysis   subjective wheeze or overt sinus or hb symptoms.    Also denies any obvious fluctuation of symptoms with weather or environmental changes or other aggravating or alleviating factors except as outlined above.   Meds reviewed/ med reconciliation completed        Observations/Objective: Anxious affects, slt hoarse but speaking in full sentences.   Assessment and Plan: See problem list for active a/p's   Follow Up Instructions: See avs for instructions unique to this ov which includes revised/ updated med list     I discussed the assessment and treatment plan with the  patient. The patient was provided an opportunity to ask questions and all were answered. The patient agreed with the plan and demonstrated an understanding of the instructions.   The patient was advised to call back or seek an in-person evaluation if the symptoms worsen or if the condition fails to improve as anticipated.  I provided 30 minutes of non-face-to-face time during this encounter.   Christinia Gully, MD
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
## 2019-02-03 NOTE — Patient Instructions (Signed)
Plan A = Automatic = symbicort 160 Take 2 puffs first thing in am and then another 2 puffs about 12 hours later/spiriva 2.5  X 2 puffs each am   Work on inhaler technique:  relax and gently blow all the way out then take a nice smooth deep breath back in, triggering the inhaler at same time you start breathing in.  Hold for up to 5 seconds if you can. Blow out symbiocrt  thru nose. Rinse and gargle with water when done  Also increased protonix to 40 mg Take 30-60 min before first meal of the day   Plan B = Backup Only use your albuterol inhaler as a rescue medication to be used if you can't catch your breath by resting or doing a relaxed purse lip breathing pattern.  - The less you use it, the better it will work when you need it. - Ok to use the inhaler up to 2 puffs  every 4 hours if you must but call for appointment if use goes up over your usual need - Don't leave home without it !!  (think of it like the spare tire for your car)   Plan C = Crisis - only use your albuterol nebulizer if you first try Plan B and it fails to help > ok to use the nebulizer up to every 4 hours but if start needing it regularly call for immediate appointment   Plan D = Deltasone  Take 2 daily until better (not much need for the nebulizer) then 1 daily    Chest discomfort that is  not   exacerbated by exercise  or coughing,  associated with generalized abd bloating, not as likely to be present supine due to the dome effect of the diaphragm which  is  canceled in that position. Frequently these patients have had multiple negative GI workups and CT scans.  Treatment consists of avoiding foods that cause gas (especially beans,  boiled eggs, mexcican food but especially  beans and undercooked vegetables like  spinach and some salads)  and citrucel 1 heaping tsp twice daily with a large glass of water.  Pain should improve w/in 2 weeks and if not then consider further GI work up.      Please schedule a follow up  visit in 3 months but call sooner if needed  with all medications /inhalers/ solutions in hand so we can verify exactly what you are taking. This includes all medications from all doctors and over the counters
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
## 2019-02-03 NOTE — Telephone Encounter (Signed)
Called and spoke with pt in regards to message from ans. Service.  Pt had a sore throat, tightness in chest x2 days.  Pt also has been sleepy during the day. Pt also has complaints of cough with clear mucus and headache.  Denies any body aches or fever. Pt also denies any recent travel and has not been around anyone that has been sick.  Pt wants to know recommendations to help for his symptoms (if MW could prescribe meds, maybe a  decongestant, etc)  Dr. [name], please advise on this for pt. Thanks! Instructions   Plan A = Automatic = symbicort 160 Take 2 puffs first thing in am and then another 2 puffs about 12 hours later/spiriva 2.5  X 3 puffs each am   Work on inhaler technique:  relax and gently blow all the way out then take a nice smooth deep breath back in, triggering the inhaler at same time you start breathing in.  Hold for up to 5 seconds if you can. Blow out symbiocrt  thru nose. Rinse and gargle with water when done   Plan B = Backup Only use your albuterol inhaler as a rescue medication to be used if you can't catch your breath by resting or doing a relaxed purse lip breathing pattern.  - The less you use it, the better it will work when you need it. - Ok to use the inhaler up to 2 puffs  every 4 hours if you must but call for appointment if use goes up over your usual need - Don't leave home without it !!  (think of it like the spare tire for your car)   Plan C = Crisis - only use your albuterol nebulizer if you first try Plan B and it fails to help > ok to use the nebulizer up to every 4 hours but if start needing it regularly call for immediate appointment   Plan D = Deltasone  Take 2 daily until better then 1 daily     See Tammy NP w/in 2 weeks with all your medications, even over the counter meds, separated in two separate bags, the ones you take no matter what vs the ones you stop once you feel better and take only as needed when you feel you need them.    Tammy  will generate for you a new user friendly medication calendar that will put [place] all on the same page re: your medication use.     Without this process, it simply isn't possible to assure that we are providing  your outpatient care  with  the attention to detail we feel you deserve.   If we cannot assure that you're getting that kind of care,  then we cannot manage your problem effectively from this clinic.  Once you have seen Tammy and we are sure that we're all on the same page with your medication use she will arrange follow up with me with full pfts on return  Add:  See where he is with rehab issue which have been rec previously
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
## 2019-02-03 NOTE — Telephone Encounter (Signed)
Called and spoke with pt letting him know that MW said to schedule a televisit for him. Pt expressed understanding. televisit scheduled for pt with MW today at 2:45. Nothing further needed.

## 2019-02-03 NOTE — Telephone Encounter (Signed)
Needs televisit  - I'm available until noon this am and p 4pm

## 2019-02-04 ENCOUNTER — Encounter: Payer: Self-pay | Admitting: Internal Medicine

## 2019-02-04 NOTE — Assessment & Plan Note (Signed)
Again rec citrucel/ low gas diet    see avs for instructions unique to this ov
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
## 2019-02-04 NOTE — Assessment & Plan Note (Signed)
Active smoker  - 02/14/2014   try symbicort 160 2bid  - PFT's  02/14/2014   FEV1 1.82 (53%) and ratio 47 no better p alb (p spiriva and symbicort 160)  and DLCO 32 with 37%  07/2014 >Spiriva stopped  09/15/2014 Med calendar> not using 02/20/2015   - Declined rehab 07/31/15  - Quit smoking 11/2016  - flutter valve 04/01/2016  -med calendar 08/20/16  - added prn pred to action plan 12/05/2016  - changed to maint pred 02/25/2017 with floor of 10 mg daily >>>  - 06/05/2017  After extensive coaching device effectiveness =    90% smi > change tudorza to spiriva 2.5 x 2 > unable to afford - 11/03/2017  After extensive coaching inhaler device  effectiveness =    90% with DPI/ tudorza  - add pepcid 20 mg at hs 01/30/2018 for noct/ am wheeze - Spirometry 03/16/2018  FEV1 1.16 (34%)  Ratio 57 p am symb/ tudorza - referred again to rehab 06/10/2018  And 07/27/2018  > declined 09/18/2018  - 07/27/2018  After extensive coaching inhaler device,  effectiveness =    90% with respimat  - 09/18/2018 flutter valve training/ review - 12/21/2018  After extensive coaching inhaler device,  effectiveness =    75% (short Ti)     Showing very poor self management skills and worsening symptoms may be more related to anxiety/aerophagia than airways dz but from pulmonary perspective just needs to go back to the ABCDE plan we gave him last ov and which I went over with him line by line again today;  The goal with a chronic steroid dependent illness is always arriving at the lowest effective dose that controls the disease/symptoms and not accepting a set "formula" which is based on statistics or guidelines that don't always take into account patient  variability or the natural hx of the dz in every individual patient, which may well vary over time.  For now therefore I recommend the patient maintain  20 mg ceiling (when needing much more saba than baseline, which is not he case now) and 10 mg floor

## 2019-02-04 NOTE — Assessment & Plan Note (Signed)
Counseled re importance of smoking cessation but did not meet time criteria for separate billing      Each maintenance medication was reviewed in detail including most importantly the difference between maintenance and as needed and under what circumstances the prns are to be used.  Please see AVS for specific  Instructions which are unique to this visit and I personally typed out  which were reviewed in detail I with the patient and a copy mailed to patient.

## 2019-02-16 ENCOUNTER — Other Ambulatory Visit: Payer: Self-pay | Admitting: Internal Medicine

## 2019-02-16 ENCOUNTER — Telehealth: Payer: Self-pay | Admitting: Internal Medicine

## 2019-02-16 MED ORDER — AZITHROMYCIN 250 MG PO TABS: ORAL_TABLET | ORAL | 0 refills | Status: DC

## 2019-02-16 NOTE — Telephone Encounter (Signed)
Called and spoke with pt who stated he has complaints of sore throat x4 days, headache x4 days, and chest tightness due to COPD.  Pt denies any fever and states SOB is the same as usual. Pt is coughing and is getting up clear mucus.  Pt has had to use his rescue inhaler about 3 times daily and states he has only had to use his nebulizer about once within the last two weeks.  Pt is requesting meds to be called in to pharmacy to help with symptoms. Dr. [name], please advise on this for pt. Thanks!

## 2019-02-16 NOTE — Telephone Encounter (Signed)
z-pak 

## 2019-02-16 NOTE — Telephone Encounter (Signed)
Called and spoke with pt letting him know that we were sending zpak abx in to pharmacy for him. Pt expressed understanding. Verified pt's preferred pharmacy and sent Rx in to pharmacy. Nothing further needed.

## 2019-02-17 ENCOUNTER — Other Ambulatory Visit: Payer: Self-pay | Admitting: Internal Medicine

## 2019-02-17 ENCOUNTER — Telehealth: Payer: Self-pay | Admitting: Internal Medicine

## 2019-02-17 NOTE — Telephone Encounter (Signed)
Returned call to pt. He thought 2 rxs were supposed to be sent in. Re-informed as Raquel Sarna did on yesterday that Dr. [name] was sending in zpak. Pt verbalized understanding nothing further needed.

## 2019-03-05 ENCOUNTER — Other Ambulatory Visit: Payer: Self-pay | Admitting: Primary Care

## 2019-03-17 ENCOUNTER — Telehealth: Payer: Self-pay | Admitting: Internal Medicine

## 2019-03-17 NOTE — Telephone Encounter (Signed)
Called and spoke with pt regarding switch DME companies for his O2  Pt requesting from [employer] to [place] having issues of running out of O2 Pt would like televisit with MW this week to further discuss if he can get a POC Pt would like to get POC from [facility], explained to him that they do not supply POC He wants it from there, and would like expect another company over the phone Scheduled televisit with MW tomorrow 03/18/19 at 330pm Pt expressed and verbalized understanding  Nothing further is needed.

## 2019-03-18 ENCOUNTER — Encounter: Payer: Self-pay | Admitting: Internal Medicine

## 2019-03-18 ENCOUNTER — Other Ambulatory Visit: Payer: Self-pay

## 2019-03-18 ENCOUNTER — Ambulatory Visit (INDEPENDENT_AMBULATORY_CARE_PROVIDER_SITE_OTHER): Payer: Medicaid Other | Admitting: Internal Medicine

## 2019-03-18 DIAGNOSIS — J449 Chronic obstructive pulmonary disease, unspecified: Secondary | ICD-10-CM | POA: Diagnosis not present

## 2019-03-18 DIAGNOSIS — J9611 Chronic respiratory failure with hypoxia: Secondary | ICD-10-CM | POA: Diagnosis not present

## 2019-03-18 NOTE — Patient Instructions (Signed)
We will need to send to paper copy to you   No change in recommendations - no need for 02 at rest / just with walking  With goal of keeping > 90%    Please schedule a follow up office visit in 6 weeks, call sooner if needed

## 2019-03-18 NOTE — Assessment & Plan Note (Signed)
First started on 27 December 2013 by [facility] clinic - 02/14/2014  Walked RA x 3/4  laps @ 185 ft each stopped due to  desat 85% and corrected on 3lpm to x 2 laps  - 06/15/2014  Walked 2.5lpm  2 laps  @ 185 ft each stopped due to  Sob adequate sats  - 02/20/2015  Walked RA x 3 laps @ 185 ft each stopped due to  Sob/desat p 2 laps, nl pace, eliminated on his 02 at 3lpm  - 12/05/2015   Walked RA  2 laps @ 185 ft each stopped due to  desat to 88% and corrected on 2lpm  - 03/16/2018   Walked 3lmpm  x one lap @ 185 stopped due to /desat  To 89% at relatively fast pace  - 06/10/2018   Walked RA x one lap @ 185 stopped due to  Sob / desats to 86% resolved on repeating walk on 2lpm but still sob p one lap  - 07/27/2018  Walked 2lpm continuous x 2 laps @ 185 ft each stopped due to  End of study, nl pace, no desats but stopped due to sob - 09/18/2018   Walked 0.5 lpm  x one lap @ 1=210 ft stopped due to  Sob s desats   rec as of 12/21/2018  2lpm hs and prn daytime with goal of > 90% at all times but if does not have oximetry just use 1lpm and bring his own equipment with him at next ov to verify it is adequate   02/03/2019  Reviewed previous instructions > he has 02 and should use it when sob in daytime if can't monitor sats (doubt will find oximetery now until COVID - 19 restrictions have been lifted as they are sold out.   03/18/2019 > trying now to qualify for POC but may not be available on his plan even if otherwise qualifies so no change rx until returns for walking sats on / off 02   Each maintenance medication was reviewed in detail including most importantly the difference between maintenance and as needed and under what circumstances the prns are to be used.  Please see AVS for specific  Instructions which are unique to this visit and I personally typed out  which were reviewed in detail over the phone with the patient and a copy provided via MyChart

## 2019-03-18 NOTE — Assessment & Plan Note (Signed)
Active smoker  - 02/14/2014   try symbicort 160 2bid  - PFT's  02/14/2014   FEV1 1.82 (53%) and ratio 47 no better p alb (p spiriva and symbicort 160)  and DLCO 32 with 37%  07/2014 >Spiriva stopped  09/15/2014 Med calendar> not using 02/20/2015   - Declined rehab 07/31/15  - Quit smoking 11/2016  - flutter valve 04/01/2016  -med calendar 08/20/16  - added prn pred to action plan 12/05/2016  - changed to maint pred 02/25/2017 with floor of 10 mg daily >>>  - 06/05/2017  After extensive coaching device effectiveness =    90% smi > change tudorza to spiriva 2.5 x 2 > unable to afford - 11/03/2017  After extensive coaching inhaler device  effectiveness =    90% with DPI/ tudorza  - add pepcid 20 mg at hs 01/30/2018 for noct/ am wheeze - Spirometry 03/16/2018  FEV1 1.16 (34%)  Ratio 57 p am symb/ tudorza - referred again to rehab 06/10/2018  And 07/27/2018  > declined 09/18/2018  - 07/27/2018  After extensive coaching inhaler device,  effectiveness =    90% with respimat  - 09/18/2018 flutter valve training/ review - 12/21/2018  After extensive coaching inhaler device,  effectiveness =    75% (short Ti)    Continues to struggle with difficulty with panic > increased rr > worse air trapping > worse panic and over using 02 /pred and nebs but nothing else to offer over the phone > no change in meds> ov 6 weeks

## 2019-03-18 NOTE — Progress Notes (Signed)
Subjective:   Patient ID: Tony Sullivan, male    DOB: 02-15-1961  MRN: [id number]    Brief patient profile:  81  yobm active smoker with breathing difficulties since 2004 dx as copd on advair 250 /50 but getting worse since 2014 and placed on 02 for the first time in March 2015 and referred 01/07/2014 to pulmonary clinic by Dr Kennon Holter with GOLD II copd criteria established 02/14/14    History of Present Illness  01/07/2014 1st [facility] office visit/ [name]  Chief Complaint  Patient presents with  . Advice Only    Referred for COPD, SOB.  Pt c/o SOB, low 02, prod cough with white mucous.  Pt forgot med list, unsure of what all he is taking  still can do a [employer] but uses [facility] parking, assoc with congested coughing esp in am worse in winter. rec Stop advair   Plan A =  Automatic =  symbicort 160 Take 2 puffs first thing in am and then another 2 puffs about 12 hours later. spiriva in am only  Plan B = Backup- Proventil use it only if you can't your breath - ok to use up to every 4 hours if needed Plan C = Nebulizer up to every 4 hours if needed only if you try plan B first and it doesn't work    01/30/2018  f/u ov/[name] re: COPD GOLD II / anxiety  Chief Complaint  Patient presents with  . Follow-up    increased SOB x 1week,productive cough light green in color  Dyspnea:  X 50 ft on 2lpm  Cough: more freq x one week/ no pattern> has oxy but hasn't started yet  Sleep: on 02 2lpm @ 30 degrees and worse wheeze in am when rising SABA use:  saba hfa = neb  Including x 2 in am of ov  rec Pepcid 20 mg one at bedtime  See calendar for specific medication instructions      03/16/2018  f/u ov/[name] re: GOLD II/ worse sob/ no med calendar  Chief Complaint  Patient presents with  . Follow-up    Breathing has been progressively worse since last visit. He is noticing dizziness when he gets SOB.  He has been using his proair 4-5 x per day and neb with albuterol 2 x daily. He has  noticed some swelling in his left foot over the past wk.    Dyspnea:  No better/ 50 ft gives out (walks fast pace)  Cough: mild hacking Sleep: 30 degrees and 2-3 lpm  SABA use:   Already this am used prednisone 10 mg x 2 / symb x 2/ tudorza / saba x 3 pffs so far  rec Due  to your copd and respiratory failure I recommend you stay home through Wednesay the 22nd to use your 02 and multiple pulmonary medications around the clock as prescribed  See calendar for specific medication instructions and bring it back for each and every office visit for every healthcare provider you see.  Without it,  you may not receive the best quality medical care that we feel you deserve.    04/28/18  NP recs Check CXR and sputum culture Prednisone taper (4,4,4,3,3,3,2,2,2,); then continue as prescribed  Use neb treatments as needed for wheeze/cough  (do not duplicate with albuterol inhaler) Takes doxycyline per PCP, monitor how frequently you are using this. If taken regularly you could develop antibiotic resistance.  We advise that you stop smoking (do not smoke with  oxygen tank on) PLEASE bring in medications to next apt  Follow-up in 6 weeks      06/10/2018  f/u ov/[name] re: post hosp f/u - did not bring meds as req / not using 02 as rec (uses p sob with activity) Chief Complaint  Patient presents with  . Follow-up    Breathing has improved some. He has d/ced alot of his meds and feels this has helped. He is using his neb 2 x daily and albuterol inhaler once daily.   Dyspnea:  MMRC3 = can't walk 100 yards even at a slow pace at a flat grade s stopping due to sob  Cough: clear mucus worse in am SABA use: puffer << neb (not the instructions given)  02:   2 lpm hs and daytime prn  Last pred x 4 days prior to OV   taken at bedtime rec Plan A = Automatic = symbicort 160 Take 2 puffs first thing in am and then another 2 puffs about 12 hours later along with tudorza one twice daily  Plan B = Backup Only use  your albuterol inhaler (Proair) as a rescue medication Plan C = Crisis - only use your albuterol nebulizer if you first try Plan B and it fails to help > ok to use the nebulizer up to every 4 hours but if start needing it regularly call for immediate appointment Use 02 to keep your saturations over 90%  > ok to adjust upwards if needed    06/24/18 NP eval/ recs: May use saline spray for nasal dryness/congestion continue current medications Medication calender updated Flu vaccine today Follow up in 1 month with Dr. [name] - bring all medications Please try to quit smoking     07/27/2018  f/u ov/[name] re: copd/ did not bring med calendar or all meds/ still smoking / prednisone 20 mg daily  Chief Complaint  Patient presents with  . Follow-up    States his breathing is about the same but when it is hot it gets much worse.   Dyspnea:  Across room even on 02 sob unless uses nebs / MMRC4  = sob if tries to leave home or while getting dressed   Cough: clear / rattling Sleeping: < 30 degrees from multiple pillows SABA use: way over using 02: 02 2lpm   rec For cough / congestion > mucinex up to 1200 mg every 12 hours and use the flutter valve as much as you can Try off tudorza and start back on spiriva 2.5  X 2 pffs each am  Call if unable to get back into [facility] rehab  Please schedule a follow up office visit in 6 weeks, call sooner if needed with all medications /inhalers/ solutions in hand   08/12/18 ov NP rec Will order pulmonary rehab Will reorder Spiriva Patient has tried other inhalers and does not have the inspiratory capacity to inhale dry powder.  Continue Symbicort and albuterol    09/18/2018  f/u ov/[name] re:  GOLD III copd / prednisone 20 mg / did not bring med cal as req maint on symb 160/spiriva smi Chief Complaint  Patient presents with  . Follow-up    Breathing is doing well. He is using his albuterol inhaler 4-5 per day because he states he has been walking more.  He uses his neb 3 to 4 x per wk.    Dyspnea:  Can't do [employer] s stopping on 1lpm but doesn't check sats or titrate up  Cough: esp  in am x  first hour but mostly white and < a tbsp or two produced  Sleeping: ok flat  SABA use: as above 02: sleeping on 1.5- 2lpm and never above 1lpm walking  rec Try prednisone 10 mg daily to see if you do just as well and if flares then double the dose until better Only use your albuterol as a rescue medication  Please use flutter valve in am to help with the morning congestion     12/21/2018  f/u ov/Dvon Jiles re:  GOLD III copd/ pred 30 mg daily with floor 10 mg on symb 160/spiriva  Chief Complaint  Patient presents with  . Follow-up    Pt c/o sinus pressure off and on for the past wk. He is having some increased SOB this morning.    Dyspnea:  Can do shopping at grocery on 2lpm does not check sats  Cough: usual cough = rattling  Sleeping: bed is flat / pillows up to 30 degrees  SABA use: up to q 6 hours on neb  02: 2lpm rec Plan A = Automatic = Symbicort 160 Take 2 puffs first thing in am and then another 2 puffs about 12 hours later/spiriva 2.5  X 3 puffs each am  Work on inhaler technique:  Plan B = Backup Only use your albuterol inhaler as a rescue medication    Plan C = Crisis - only use your albuterol nebulizer if you first try Plan B  Plan D = Deltasone  Take 2 daily until better then 1 daily  Add:  See where he is with rehab issue which have been rec previously     Virtual Visit via Telephone Note 02/03/2019   I connected with [name] on 02/03/19 at  2:45 PM EDT by telephone and verified that I am speaking with the correct person using two identifiers.   I discussed the limitations, risks, security and privacy concerns of performing an evaluation and management service by telephone and the availability of in person appointments. I also discussed with the patient that there may be a patient responsible charge related to this service.  The patient expressed understanding and agreed to proceed.   History of Present Illness:   On daily prednisone 10 mg x 2 but taking second dose at bedtime and still smoking but "working on it"   Dyspnea:  Ex x 15 min x 4 per day  Walking and steps  Cough: nothing unusual  Sleeping: 30 degrees  SABA use: neb qod  02: 2lpm prn but not checking his sats as rec Fleeting L arm discomfort fleeting "few secs" not related to exertion  Also chest tightness that doesn't related to exertion and seems worse when abd swells up.  No assoc n or v or diaph rec Plan A = Automatic = symbicort 160 Take 2 puffs first thing in am and then another 2 puffs about 12 hours later/spiriva 2.5  X 2 puffs each am  Work on inhaler technique: Also increased protonix to 40 mg Take 30-60 min before first meal of the day  Plan B = Backup Only use your albuterol inhaler as a rescue medication Plan C = Crisis - only use your albuterol nebulizer if you first try Plan B  Plan D = Deltasone  Take 2 daily until better (not much need for the nebulizer) then 1 daily  IBS RX: Treatment consists of avoiding foods that cause gas (especially beans,  boiled eggs, mexcican food but especially  beans  and undercooked vegetables like  spinach and some salads)  and citrucel 1 heaping tsp twice daily with a large glass of water.  Pain should improve w/in 2 weeks and if not then consider further GI work up.    Virtual Visit via Telephone Note 03/18/2019 re copd/ 02 dep   I connected with [name] on 03/18/19 at 4:10 pm by telephone and verified that I am speaking with the correct person using two identifiers.   I discussed the limitations, risks, security and privacy concerns of performing an evaluation and management service by telephone and the availability of in person appointments. I also discussed with the patient that there may be a patient responsible charge related to this service. The patient expressed understanding and  agreed to proceed.   History of Present Illness: Prednisone back to 20 mg / day Dyspnea:  Sometimes at rest / not correlating with sats  Cough: no change  Sleeping: flat  SABA use: hfa up to qid/  Neb once a day typically first thing  02: sleeping on 2.5 lpm  - at rest on 3lpm and goal is POC      No obvious day to day or daytime variability or assoc excess/ purulent sputum or mucus plugs or hemoptysis or cp or chest tightness, subjective wheeze or overt sinus or hb symptoms.    Also denies any obvious fluctuation of symptoms with weather or environmental changes or other aggravating or alleviating factors except as outlined above.      Observations/Objective: Anxious, has trouble sticking to one topic at a time/speaking in full sentences/ min hoarse   Assessment and Plan: See problem list for active a/p's   Follow Up Instructions: See avs for instructions unique to this ov which includes revised/ updated med list     I discussed the assessment and treatment plan with the patient. The patient was provided an opportunity to ask questions and all were answered. The patient agreed with the plan and demonstrated an understanding of the instructions.   The patient was advised to call back or seek an in-person evaluation if the symptoms worsen or if the condition fails to improve as anticipated.  I provided 15  minutes of non-face-to-face time during this encounter.   Christinia Gully, MD

## 2019-03-30 ENCOUNTER — Telehealth: Payer: Self-pay | Admitting: Internal Medicine

## 2019-03-30 DIAGNOSIS — J449 Chronic obstructive pulmonary disease, unspecified: Secondary | ICD-10-CM

## 2019-03-30 MED ORDER — AZITHROMYCIN 250 MG PO TABS: ORAL_TABLET | ORAL | 0 refills | Status: DC

## 2019-03-30 NOTE — Telephone Encounter (Signed)
Assessment & Plan Note by [name], MD at 03/18/2019 5:23 PM  Author: [name], MD Author Type: Physician Filed: 03/18/2019 5:24 PM  Note Status: Written Cosign: Cosign Not Required Encounter Date: 03/18/2019  Problem: COPD GOLD III  Editor: [name], MD (Physician)    Active smoker  - 02/14/2014   try symbicort 160 2bid  - PFT's  02/14/2014   FEV1 1.82 (53%) and ratio 47 no better p alb (p spiriva and symbicort 160)  and DLCO 32 with 37%  07/2014 >Spiriva stopped  09/15/2014 Med calendar> not using 02/20/2015   - Declined rehab 07/31/15  - Quit smoking 11/2016  - flutter valve 04/01/2016  -med calendar 08/20/16  - added prn pred to action plan 12/05/2016  - changed to maint pred 02/25/2017 with floor of 10 mg daily >>>  - 06/05/2017  After extensive coaching device effectiveness =    90% smi > change tudorza to spiriva 2.5 x 2 > unable to afford - 11/03/2017  After extensive coaching inhaler device  effectiveness =    90% with DPI/ tudorza  - add pepcid 20 mg at hs 01/30/2018 for noct/ am wheeze - Spirometry 03/16/2018  FEV1 1.16 (34%)  Ratio 57 p am symb/ tudorza - referred again to rehab 06/10/2018  And 07/27/2018  > declined 09/18/2018  - 07/27/2018  After extensive coaching inhaler device,  effectiveness =    90% with respimat  - 09/18/2018 flutter valve training/ review - 12/21/2018  After extensive coaching inhaler device,  effectiveness =    75% (short Ti)    Continues to struggle with difficulty with panic > increased rr > worse air trapping > worse panic and over using 02 /pred and nebs but nothing else to offer over the phone > no change in meds> ov 6 weeks    Patient Instructions by [name], MD at 03/18/2019 3:30 PM  Author: [name], MD Author Type: Physician Filed: 03/18/2019 4:21 PM  Note Status: Signed Cosign: Cosign Not Required Encounter Date: 03/18/2019  Editor: [name], MD (Physician)    We will need to send to paper copy to you   No  change in recommendations - no need for 02 at rest / just with walking  With goal of keeping > 90%    Please schedule a follow up office visit in 6 weeks, call sooner if needed       Called and spoke with pt who stated he has tightness in chest which began 2 days ago. Pt states he believes this is due to pollen. Pt stated he checked his temp and it was 97.7.  Pt also has complaints of postnasal drainage which is clear in color and has also had some sneezing attacks. Asked pt if he had any coughing and pt stated he has no more coughing than he usually does.  Pt has been doing neb treatments twice daily and has used his rescue inhaler about three times daily but states that could vary depending on if he goes outside.  Pt is requesting to have zpak sent to pharmacy and wants to know any other recs to help with his symptoms. Dr. [name], please advise on this for pt. Thanks!

## 2019-03-30 NOTE — Telephone Encounter (Signed)
zpak Rx sent to pt's preferred pharmacy. Called and spoke with pt letting him  Know this had been done and pt verbalized understanding. Nothing further needed.

## 2019-03-30 NOTE — Telephone Encounter (Signed)
Already has prednisone as part of his action plan and should use it now as per previous instructions   zpak ok

## 2019-04-26 ENCOUNTER — Telehealth: Payer: Self-pay | Admitting: Internal Medicine

## 2019-04-26 MED ORDER — PREDNISONE 10 MG PO TABS: ORAL_TABLET | ORAL | 0 refills | Status: DC

## 2019-04-26 NOTE — Telephone Encounter (Signed)
Called and spoke with pt letting him know that MW sent prednisone Rx to pharmacy and I stated to him the instructions on the Rx. Pt verbalized understanding. Nothing further needed.

## 2019-04-26 NOTE — Telephone Encounter (Signed)
Let him know I renewed the one that says to take up to 4 daily until better and find somewhere to stay that has ac

## 2019-04-26 NOTE — Telephone Encounter (Signed)
Spoke with pt, he states his prednisone was increased to 3 tabs a day. He is requesting to take 4 tabs daily (40mg ) because it is very hot in his home due to a broken air conditioner and he states the prednisone really helps his breathing. He is down to 2 pills and would like [place] to send in another RX but for 4 tabs daily. Dr. [name] please advise.   Patient Instructions by [name], MD at 03/18/2019 3:30 PM Author: [name], MD Author Type: Physician Filed: 03/18/2019 4:21 PM  Note Status: Signed Cosign: Cosign Not Required Encounter Date: 03/18/2019  Editor: [name], MD (Physician)    We will need to send to paper copy to you   No change in recommendations - no need for 02 at rest / just with walking  With goal of keeping > 90%    Please schedule a follow up office visit in 6 weeks, call sooner if needed

## 2019-04-26 NOTE — Telephone Encounter (Signed)
ATC pt, no answer. Left message for pt to call back.  

## 2019-05-13 ENCOUNTER — Other Ambulatory Visit: Payer: Self-pay

## 2019-05-13 ENCOUNTER — Telehealth: Payer: Self-pay | Admitting: Internal Medicine

## 2019-05-13 ENCOUNTER — Other Ambulatory Visit: Payer: Self-pay | Admitting: Internal Medicine

## 2019-05-13 ENCOUNTER — Ambulatory Visit (INDEPENDENT_AMBULATORY_CARE_PROVIDER_SITE_OTHER): Payer: Medicaid Other | Admitting: Internal Medicine

## 2019-05-13 ENCOUNTER — Encounter: Payer: Self-pay | Admitting: Internal Medicine

## 2019-05-13 VITALS — BP 142/84 | HR 85 | Temp 97.8°F | Ht 71.5 in | Wt 159.0 lb

## 2019-05-13 DIAGNOSIS — F17210 Nicotine dependence, cigarettes, uncomplicated: Secondary | ICD-10-CM | POA: Diagnosis not present

## 2019-05-13 DIAGNOSIS — J449 Chronic obstructive pulmonary disease, unspecified: Secondary | ICD-10-CM

## 2019-05-13 DIAGNOSIS — J9611 Chronic respiratory failure with hypoxia: Secondary | ICD-10-CM | POA: Diagnosis not present

## 2019-05-13 NOTE — Assessment & Plan Note (Signed)
First started on 27 December 2013 by [facility] clinic - 02/14/2014  Walked RA x 3/4  laps @ 185 ft each stopped due to  desat 85% and corrected on 3lpm to x 2 laps  - 06/15/2014  Walked 2.5lpm  2 laps  @ 185 ft each stopped due to  Sob adequate sats  - 02/20/2015  Walked RA x 3 laps @ 185 ft each stopped due to  Sob/desat p 2 laps, nl pace, eliminated on his 02 at 3lpm  - 12/05/2015   Walked RA  2 laps @ 185 ft each stopped due to  desat to 88% and corrected on 2lpm  - 03/16/2018   Walked 3lmpm  x one lap @ 185 stopped due to /desat  To 89% at relatively fast pace  - 06/10/2018   Walked RA x one lap @ 185 stopped due to  Sob / desats to 86% resolved on repeating walk on 2lpm but still sob p one lap  - 07/27/2018  Walked 2lpm continuous x 2 laps @ 185 ft each stopped due to  End of study, nl pace, no desats but stopped due to sob - 09/18/2018   Walked 0.5 lpm  x one lap @ 1=210 ft stopped due to  Sob s desats 05/13/2019 Patient Saturations on Room Air at Rest = 97% then while Ambulating = 88% Patient Saturations on 4 Liters of pulsed oxygen while Ambulating = 94%   rec as of 05/13/2019   4lpm hs and POC walking but not needed at rest

## 2019-05-13 NOTE — Assessment & Plan Note (Signed)
Active smoker  - 02/14/2014   try symbicort 160 2bid  - PFT's  02/14/2014   FEV1 1.82 (53%) and ratio 47 no better p alb (p spiriva and symbicort 160)  and DLCO 32 with 37%  07/2014 >Spiriva stopped  09/15/2014 Med calendar> not using 02/20/2015   - Declined rehab 07/31/15  - Quit smoking 11/2016  - flutter valve 04/01/2016  -med calendar 08/20/16  - added prn pred to action plan 12/05/2016  - changed to maint pred 02/25/2017 with floor of 10 mg daily >>>  - 06/05/2017  After extensive coaching device effectiveness =    90% smi > change tudorza to spiriva 2.5 x 2 > unable to afford - 11/03/2017  After extensive coaching inhaler device  effectiveness =    90% with DPI/ tudorza  - add pepcid 20 mg at hs 01/30/2018 for noct/ am wheeze - Spirometry 03/16/2018  FEV1 1.16 (34%)  Ratio 57 p am symb/ tudorza - referred again to rehab 06/10/2018  And 07/27/2018  > declined 09/18/2018  - 07/27/2018  After extensive coaching inhaler device,  effectiveness =    90% with respimat  - 09/18/2018 flutter valve training and started daily pred with floor 10 mg  - 12/21/2018  After extensive coaching inhaler device,  effectiveness =    75% (short Ti)  Way over using saba in multiple forms and double the pred "ceiling" and still smoking so little to offer in pt with ongoing pattern c/w group D copd symptoms with anxiety inducing tachypnea inducing worse air trapping and worse sob/ anxiety in a cyclical pattern.  rec he see the doc who presribed the loxitane/remeron to address anxiety further.   In terms of prednisone,  The goal with a chronic steroid dependent illness is always arriving at the lowest effective dose that controls the disease/symptoms and not accepting a set "formula" which is based on statistics or guidelines that don't always take into account patient  variability or the natural hx of the dz in every individual patient, which may well vary over time.  For now therefore I recommend the patient maintain  Ceiling of 20 mg  and floor of 10 mg

## 2019-05-13 NOTE — Patient Instructions (Addendum)
02 is 2.5 lpm at bedtime and up to 4lpm POC walking   Please schedule a follow up visit in 3 months but call sooner if needed

## 2019-05-13 NOTE — Progress Notes (Signed)
Subjective:   Patient ID: Tony Sullivan, male    DOB: 07-08-1961  MRN: [id number]    Brief patient profile:  56  yobm active smoker with breathing difficulties since 2004 dx as copd on advair 250 /50 but getting worse since 2014 and placed on 02 for the first time in March 2015 and referred 01/07/2014 to pulmonary clinic by Dr Kennon Holter with GOLD II copd criteria established 02/14/14    History of Present Illness  01/07/2014 1st [facility] office visit/ Wert  Chief Complaint  Patient presents with  . Advice Only    Referred for COPD, SOB.  Pt c/o SOB, low 02, prod cough with white mucous.  Pt forgot med list, unsure of what all he is taking  still can do a [employer] but uses [facility] parking, assoc with congested coughing esp in am worse in winter. rec Stop advair   Plan A =  Automatic =  symbicort 160 Take 2 puffs first thing in am and then another 2 puffs about 12 hours later. spiriva in am only  Plan B = Backup- Proventil use it only if you can't your breath - ok to use up to every 4 hours if needed Plan C = Nebulizer up to every 4 hours if needed only if you try plan B first and it doesn't wor   07/27/2018  f/u ov/Wert re: copd/ did not bring med calendar or all meds/ still smoking / prednisone 20 mg daily  Chief Complaint  Patient presents with  . Follow-up    States his breathing is about the same but when it is hot it gets much worse.   Dyspnea:  Across room even on 02 sob unless uses nebs / MMRC4  = sob if tries to leave home or while getting dressed   Cough: clear / rattling Sleeping: < 30 degrees from multiple pillows SABA use: way over using 02: 02 2lpm   rec For cough / congestion > mucinex up to 1200 mg every 12 hours and use the flutter valve as much as you can Try off tudorza and start back on spiriva 2.5  X 2 pffs each am  Call if unable to get back into [facility] re[facility]  Please schedule a follow up office visit in 6 weeks, call sooner if needed with all  medications /inhalers/ solutions in hand    09/18/2018  f/u ov/Wert re:  GOLD III copd / prednisone 20 mg / did not bring med cal as req maint on symb 160/spiriva smi Chief Complaint  Patient presents with  . Follow-up    Breathing is doing well. He is using his albuterol inhaler 4-5 per day because he states he has been walking more. He uses his neb 3 to 4 x per wk.    Dyspnea:  Can't do [employer] stopping on 1lpm but doesn't check sats or titrate up  Cough: esp in am x  first hour but mostly white and < a tbsp or two produced  Sleeping: ok flat  SABA use: as above 02: sleeping on 1.5- 2lpm and never above 1lpm walking  rec Try prednisone 10 mg daily to see if you do just as well and if flares then double the dose until better Only use your albuterol as a rescue medication  Please use flutter valve in am to help with the morning congestion     12/21/2018  f/u ov/Wert re:  GOLD III copd/ pred 30 mg daily with floor  10 mg on symb 160/spiriva  Chief Complaint  Patient presents with  . Follow-up    Pt c/o sinus pressure off and on for the past wk. He is having some increased SOB this morning.    Dyspnea:  Can do shopping at grocery on 2lpm does not check sats  Cough: usual cough = rattling  Sleeping: bed is flat / pillows up to 30 degrees  SABA use: up to q 6 hours on neb  02: 2lpm  rec Plan A = Automatic = symbicort 160 Take 2 puffs first thing in am and then another 2 puffs about 12 hours later/spiriva 2.5  X 3 puffs each am  Work on inhaler technique Plan B = Backup Only use your albuterol inhaler as a rescue medication  Plan C = Crisis - only use your albuterol nebulizer if you first try Plan B and it fails to help > ok to use the nebulizer up to every 4 hours but if start needing it regularly call for immediate appointment Plan D = Deltasone  Take 2 daily until better then 1 daily    05/13/2019  f/u ov/Wert re:  COPD III copd and pred 20 mg bid  Chief Complaint  Patient  presents with  . Follow-up    Breathing has been progressively worse. He is still smoking. He is using his proair multiple x per day and neb daily.   Dyspnea:  Walking 2 blocks slow pace on 3-4 doesn't titrate  Cough: minimal esp am still clear  Sleeping: ok propped up 30 degrees  SABA use: way over using as above  02: no using it sitting , up to 4lpm walking, 2.5 hs    No obvious day to day or daytime variability or assoc   purulent sputum or mucus plugs or hemoptysis or cp or chest tightness, subjective wheeze or overt sinus or hb symptoms.   Sleeping as above without nocturnal  or early am exacerbation  of respiratory  c/o's or need for noct saba. Also denies any obvious fluctuation of symptoms with weather or environmental changes or other aggravating or alleviating factors except as outlined above   No unusual exposure hx or h/o childhood pna/ asthma or knowledge of premature birth.  Current Allergies, Complete Past Medical History, Past Surgical History, Family History, and Social History were reviewed in [employer] record.  ROS  The following are not active complaints unless bolded Hoarseness, sore throat, dysphagia, dental problems, itching, sneezing,  nasal congestion or discharge of excess mucus or purulent secretions, ear ache,   fever, chills, sweats, unintended wt loss or wt gain, classically pleuritic or exertional cp,  orthopnea pnd or arm/hand swelling  or leg swelling, presyncope, palpitations, abdominal pain, anorexia, nausea, vomiting, diarrhea  or change in bowel habits or change in bladder habits, change in stools or change in urine, dysuria, hematuria,  rash, arthralgias, visual complaints, headache, numbness, weakness or ataxia or problems with walking or coordination,  change in mood or  memory.        Current Meds  Medication Sig  . albuterol (PROAIR HFA) 108 (90 Base) MCG/ACT inhaler Inhale 1-2 puffs into the lungs every 4 (four) hours as  needed. INHALE TWO PUFFS UP TO EVERY 4 HOURS IF NEEDED FOR SHORT OF BREATH  . albuterol (PROVENTIL) (2.5 MG/3ML) 0.083% nebulizer solution Take 3 mLs (2.5 mg total) by nebulization every 4 (four) hours as needed for wheezing or shortness of breath.  Marland Kitchen amLODipine (NORVASC) 10 MG  tablet Take 10 mg by mouth daily.   . bacitracin ointment Apply 1 application topically 2 (two) times daily. Apply to facial and intranasal burns.  . benztropine (COGENTIN) 0.5 MG tablet Take 0.5 mg by mouth at bedtime.   . budesonide-formoterol (SYMBICORT) 160-4.5 MCG/ACT inhaler INHALE TWO PUFFS INTO THE LUNGS TWICE A DAY  . cetirizine (ZYRTEC) 10 MG tablet Take 10 mg by mouth daily.  Marland Kitchen docusate sodium (COLACE) 100 MG capsule Take 100 mg by mouth daily.   . famotidine (PEPCID) 20 MG tablet TAKE ONE TABLET BY MOUTH DAILY AT BEDTIME  . fluticasone (FLONASE) 50 MCG/ACT nasal spray Place 2 sprays into both nostrils 3 (three) times daily as needed for allergies.   Marland Kitchen guaiFENesin (MUCINEX) 600 MG 12 hr tablet Take 1 tablet (600 mg total) by mouth 2 (two) times daily.  Marland Kitchen losartan (COZAAR) 100 MG tablet Take 100 mg by mouth daily.  Marland Kitchen loxapine (LOXITANE) 10 MG capsule Take 10 mg by mouth at bedtime.   . mirtazapine (REMERON) 30 MG tablet Take 30 mg by mouth at bedtime.  . Multiple Vitamin (MULTIVITAMIN) capsule Take 1 capsule by mouth daily.  . nicotine (NICODERM CQ - DOSED IN MG/24 HOURS) 14 mg/24hr patch Place 1 patch (14 mg total) onto the skin daily.  . OXYGEN Inhale 2 L into the lungs as directed. 2-3 liters with sleep and exertion as needed for low oxygen  . pantoprazole (PROTONIX) 40 MG tablet Take 1 tablet (40 mg total) by mouth daily. Take 30-60 min before first meal of the day  . predniSONE (DELTASONE) 10 MG tablet Take 2-4 tabs per day until better. Then take 1 tablet daily.  Marland Kitchen Respiratory Therapy Supplies (FLUTTER) DEVI Use as directed  . risperiDONE (RISPERDAL) 2 MG tablet Take 2 mg by mouth at bedtime.  . sodium  chloride (OCEAN) 0.65 % SOLN nasal spray Place 2 sprays into both nostrils 6 (six) times daily.  . Tiotropium Bromide Monohydrate (SPIRIVA RESPIMAT) 2.5 MCG/ACT AERS 2 pffs each  am (Patient taking differently: Inhale 2 puffs into the lungs daily. )                         Objective:   Physical Exam  Hoarse anxious bm   06/15/2014  164  >  09/15/2014 158 >   02/20/2015 >156 03/22/2015 >  07/31/2015   150 > 12/05/2015  161   > 04/01/2016    151 > 07/29/2016  152 >   02/25/2017  153 >  06/05/2017  153 > 11/03/2017   166 > 01/30/2018  167> 03/16/2018  168 > 06/10/2018  167 > 07/27/2018  169 > 09/18/2018 163 > 12/21/2018    163  > 05/13/2019  159       HEENT: nl dentition / oropharynx. Nl external ear canals without cough reflex -  Mild bilateral non-specific turbinate edema     NECK :  without JVD/Nodes/TM/ nl carotid upstrokes bilaterally   LUNGS: no acc muscle use,  Mod barrel  contour chest wall with bilateral  Distant bs s audible wheeze and  without cough on insp or exp maneuver and mod  Hyperresonant  to  percussion bilaterally     CV:  RRR  no s3 or murmur or increase in P2, and no edema   ABD:  soft and nontender with pos mid insp Hoover's  in the supine position. No bruits or organomegaly appreciated, bowel sounds nl  MS:  ext warm without deformities, calf tenderness, cyanosis or clubbing No obvious joint restrictions   SKIN: warm and dry without lesions    NEURO:  alert, approp, nl sensorium with  no motor or cerebellar deficits apparent.

## 2019-05-13 NOTE — Assessment & Plan Note (Signed)
Counseled re importance of smoking cessation but did not meet time criteria for separate billing     I had an extended discussion with the patient   reviewing all relevant studies completed to date and  lasting 15 to 20 minutes of a 25 minute visit  which included directly observing ambulatory 02 saturation study documented in a/p section of  today's  office note.  Each maintenance medication was reviewed in detail including most importantly the difference between maintenance and prns and under what circumstances the prns are to be triggered using an action plan format that is not reflected in the computer generated alphabetically organized AVS.     Please see AVS for specific instructions unique to this visit that I personally wrote and verbalized to the the pt in detail and then reviewed with pt  by my nurse highlighting any changes in therapy recommended at today's visit .

## 2019-05-13 NOTE — Telephone Encounter (Signed)
Order was placed today for pt to receive a POC, and [employer] has confirmed both by phone and by referral message that they can provide this for pt.  This is still being processed by [employer].  Nothing further needed at this time- will close encounter.

## 2019-05-14 ENCOUNTER — Ambulatory Visit: Payer: Medicaid Other | Admitting: Internal Medicine

## 2019-05-16 ENCOUNTER — Other Ambulatory Visit: Payer: Self-pay

## 2019-05-16 ENCOUNTER — Emergency Department (HOSPITAL_COMMUNITY): Payer: Medicaid Other

## 2019-05-16 ENCOUNTER — Emergency Department (HOSPITAL_COMMUNITY)
Admission: EM | Admit: 2019-05-16 | Discharge: 2019-05-16 | Disposition: A | Discharge status: Home / Self Care | Payer: Medicaid Other | Attending: Emergency Medicine | Admitting: Emergency Medicine

## 2019-05-16 ENCOUNTER — Encounter (HOSPITAL_COMMUNITY): Payer: Self-pay | Admitting: Emergency Medicine

## 2019-05-16 DIAGNOSIS — X500XXA Overexertion from strenuous movement or load, initial encounter: Secondary | ICD-10-CM | POA: Insufficient documentation

## 2019-05-16 DIAGNOSIS — M545 Low back pain: Secondary | ICD-10-CM | POA: Diagnosis present

## 2019-05-16 DIAGNOSIS — Y999 Unspecified external cause status: Secondary | ICD-10-CM | POA: Insufficient documentation

## 2019-05-16 DIAGNOSIS — Y939 Activity, unspecified: Secondary | ICD-10-CM | POA: Diagnosis not present

## 2019-05-16 DIAGNOSIS — Z79899 Other long term (current) drug therapy: Secondary | ICD-10-CM | POA: Insufficient documentation

## 2019-05-16 DIAGNOSIS — Y929 Unspecified place or not applicable: Secondary | ICD-10-CM | POA: Diagnosis not present

## 2019-05-16 DIAGNOSIS — F1290 Cannabis use, unspecified, uncomplicated: Secondary | ICD-10-CM | POA: Insufficient documentation

## 2019-05-16 DIAGNOSIS — F17210 Nicotine dependence, cigarettes, uncomplicated: Secondary | ICD-10-CM | POA: Diagnosis not present

## 2019-05-16 DIAGNOSIS — J449 Chronic obstructive pulmonary disease, unspecified: Secondary | ICD-10-CM | POA: Insufficient documentation

## 2019-05-16 DIAGNOSIS — S39012A Strain of muscle, fascia and tendon of lower back, initial encounter: Secondary | ICD-10-CM | POA: Insufficient documentation

## 2019-05-16 LAB — BASIC METABOLIC PANEL
Anion gap: 11 (ref 5–15)
BUN: 6 mg/dL (ref 6–20)
CO2: 26 mmol/L (ref 22–32)
Calcium: 9.4 mg/dL (ref 8.9–10.3)
Chloride: 104 mmol/L (ref 98–111)
Creatinine, Ser: 0.88 mg/dL (ref 0.61–1.24)
GFR calc Af Amer: >60 mL/min (ref >60)
GFR calc non Af Amer: >60 mL/min (ref >60)
Glucose, Bld: 103 mg/dL — ABNORMAL HIGH (ref 70–99)
Potassium: 4.2 mmol/L (ref 3.5–5.1)
Sodium: 141 mmol/L (ref 135–145)

## 2019-05-16 LAB — CBC
HCT: 50.8 % (ref 39.0–52.0)
Hemoglobin: 16.2 g/dL (ref 13.0–17.0)
MCH: 32.1 pg (ref 26.0–34.0)
MCHC: 31.9 g/dL (ref 30.0–36.0)
MCV: 100.8 fL — ABNORMAL HIGH (ref 80.0–100.0)
Platelets: 256 10*3/uL (ref 150–400)
RBC: 5.04 MIL/uL (ref 4.22–5.81)
RDW: 14.3 % (ref 11.5–15.5)
WBC: 14 10*3/uL — ABNORMAL HIGH (ref 4.0–10.5)
nRBC: 0 % (ref 0.0–0.2)

## 2019-05-16 LAB — TROPONIN I (HIGH SENSITIVITY)
Troponin I (High Sensitivity): 6 ng/L (ref <18)
Troponin I (High Sensitivity): 5 ng/L (ref <18)

## 2019-05-16 IMAGING — DX LUMBAR SPINE - COMPLETE 4+ VIEW
5 series · 5 of 5 positions shown · non-contrast
Comparison: Bone window images from CT abdomen and pelvis
[DATE].

CLINICAL DATA: 57-year-old who heard an audible pop in his low back
when trying to pick up his wheelchair earlier today. Low back pain
which is worse with motion. Initial encounter.

EXAM:
LUMBAR SPINE - COMPLETE 4+ VIEW

[l-spine ap]
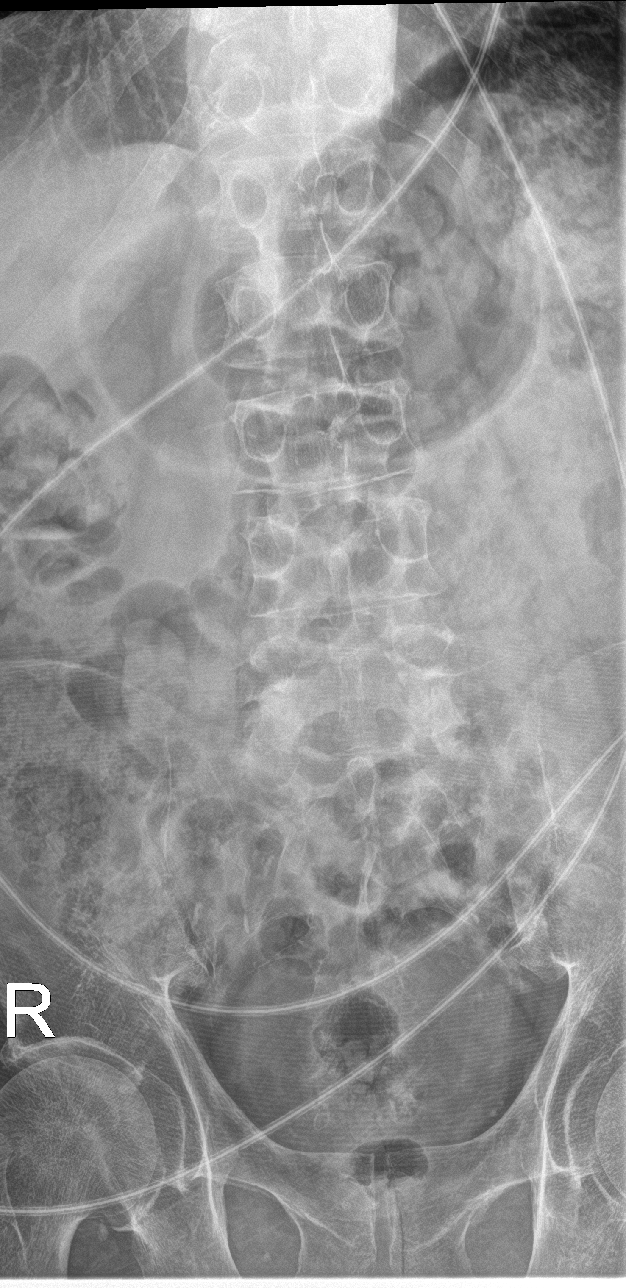

[l-spine obl (1 of 2)]
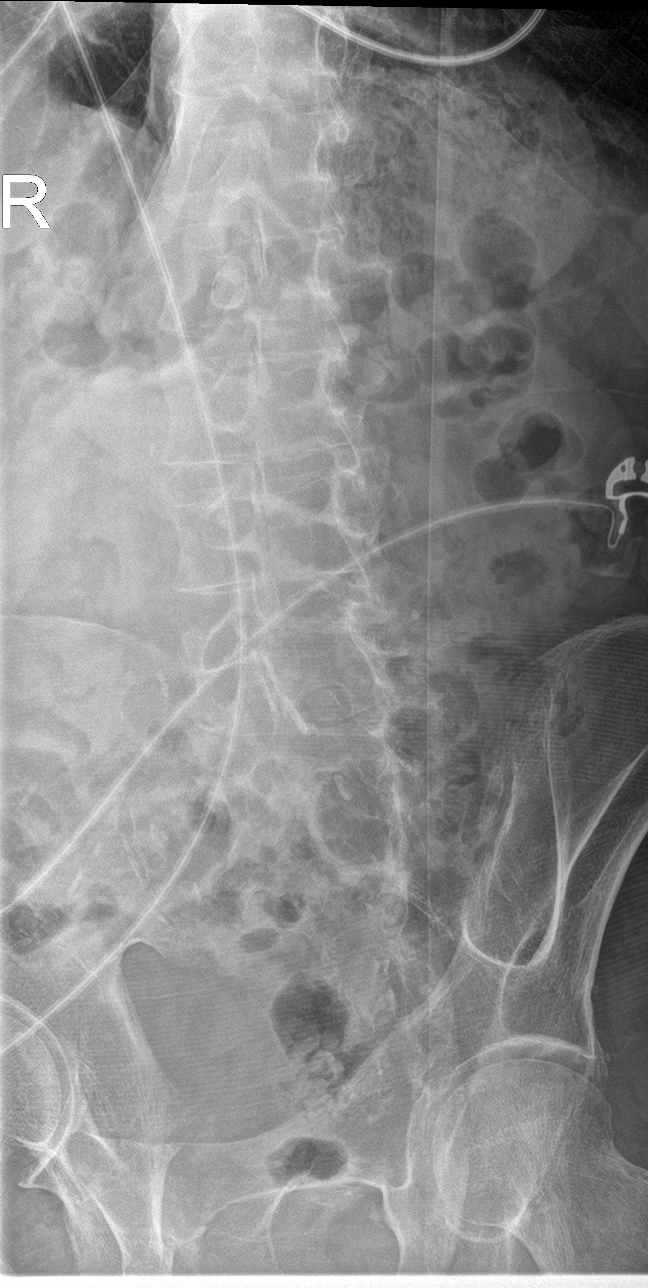

[l-spine obl (2 of 2)]
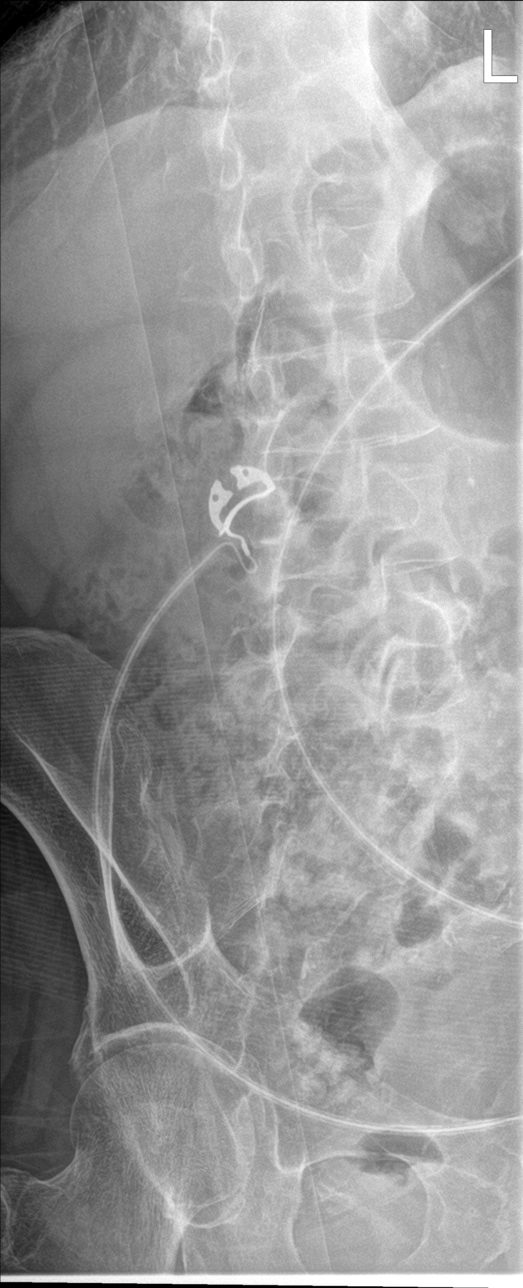

[l-spine lat]
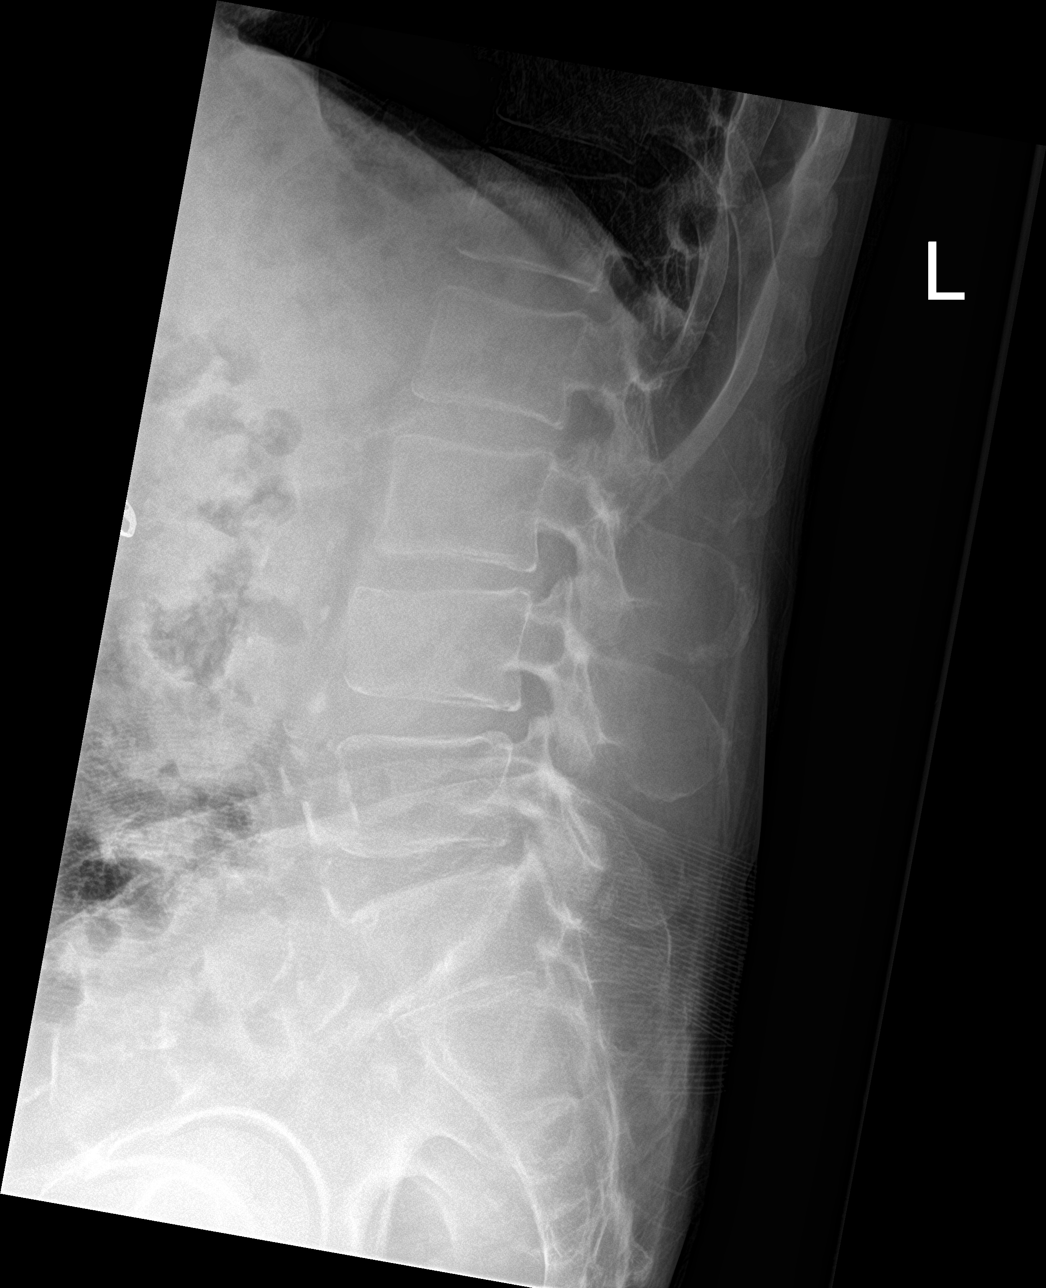

[l-spine spot]
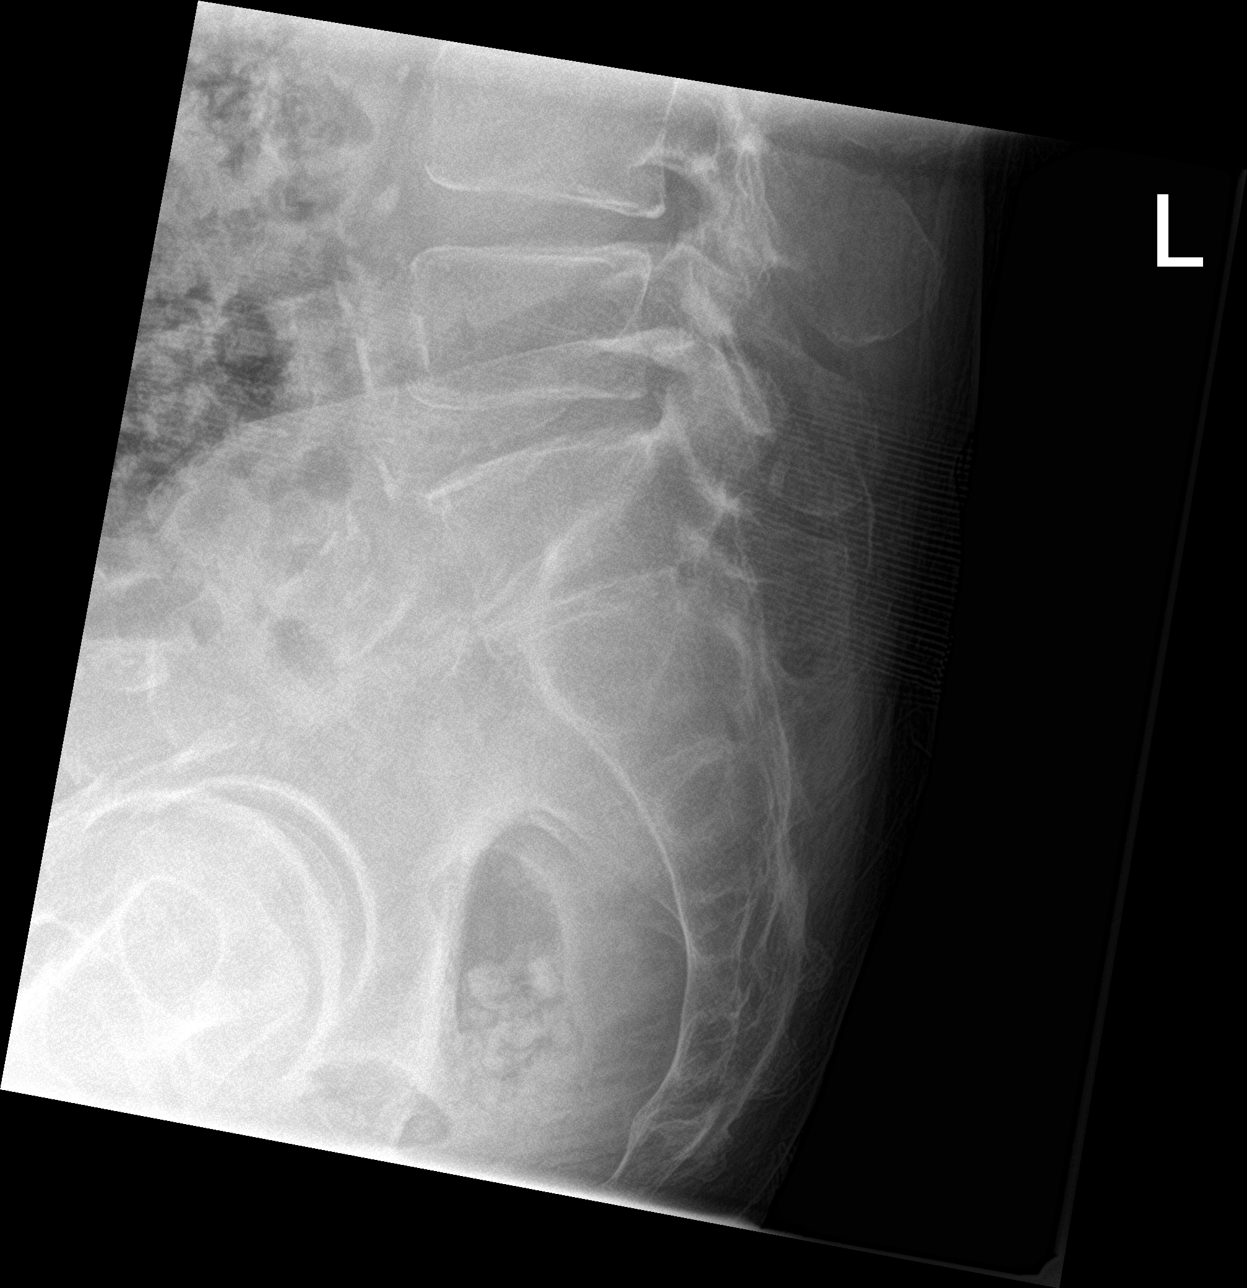

[5 of 5 positions shown; findings below may reference images not displayed]

FINDINGS: Five non-rib-bearing lumbar vertebrae with a transitional S1 segment
having a well-defined disc space between it and S2. This is
confirmed by the prior chest x-rays which have demonstrated 12
rib-bearing thoracic vertebrae.

Anatomic alignment. No fractures. Straightening of the usual
lordosis. Mild disc space narrowing at L3-4 and L5-S1, progressive
since [ZW]. No pars defects. Mild facet degenerative changes at L4-5
and L5-S1. Sacroiliac joints anatomically aligned without
significant degenerative changes.

Aortoiliac atherosclerosis without evidence of aneurysm.
IMPRESSION: 1. No acute osseous abnormality.
2. Straightening of the usual lordosis which may reflect positioning
and/or spasm.
3. Mild degenerative disc disease at L3-4 and L5-S1, progressive
since [DATE]. Mild facet degenerative changes at L4-5 and L5-S1.

Aortic Atherosclerosis ([ZW]-170.0)

## 2019-05-16 IMAGING — DX CHEST - 2 VIEW
3 series · 3 of 3 positions shown · non-contrast
Comparison: [DATE] and earlier.

CLINICAL DATA: 57-year-old with acute onset of shortness of breath
and chest tightness. Current smoker with current history of
COPD/emphysema.

EXAM:
CHEST - 2 VIEW

[chest lat]
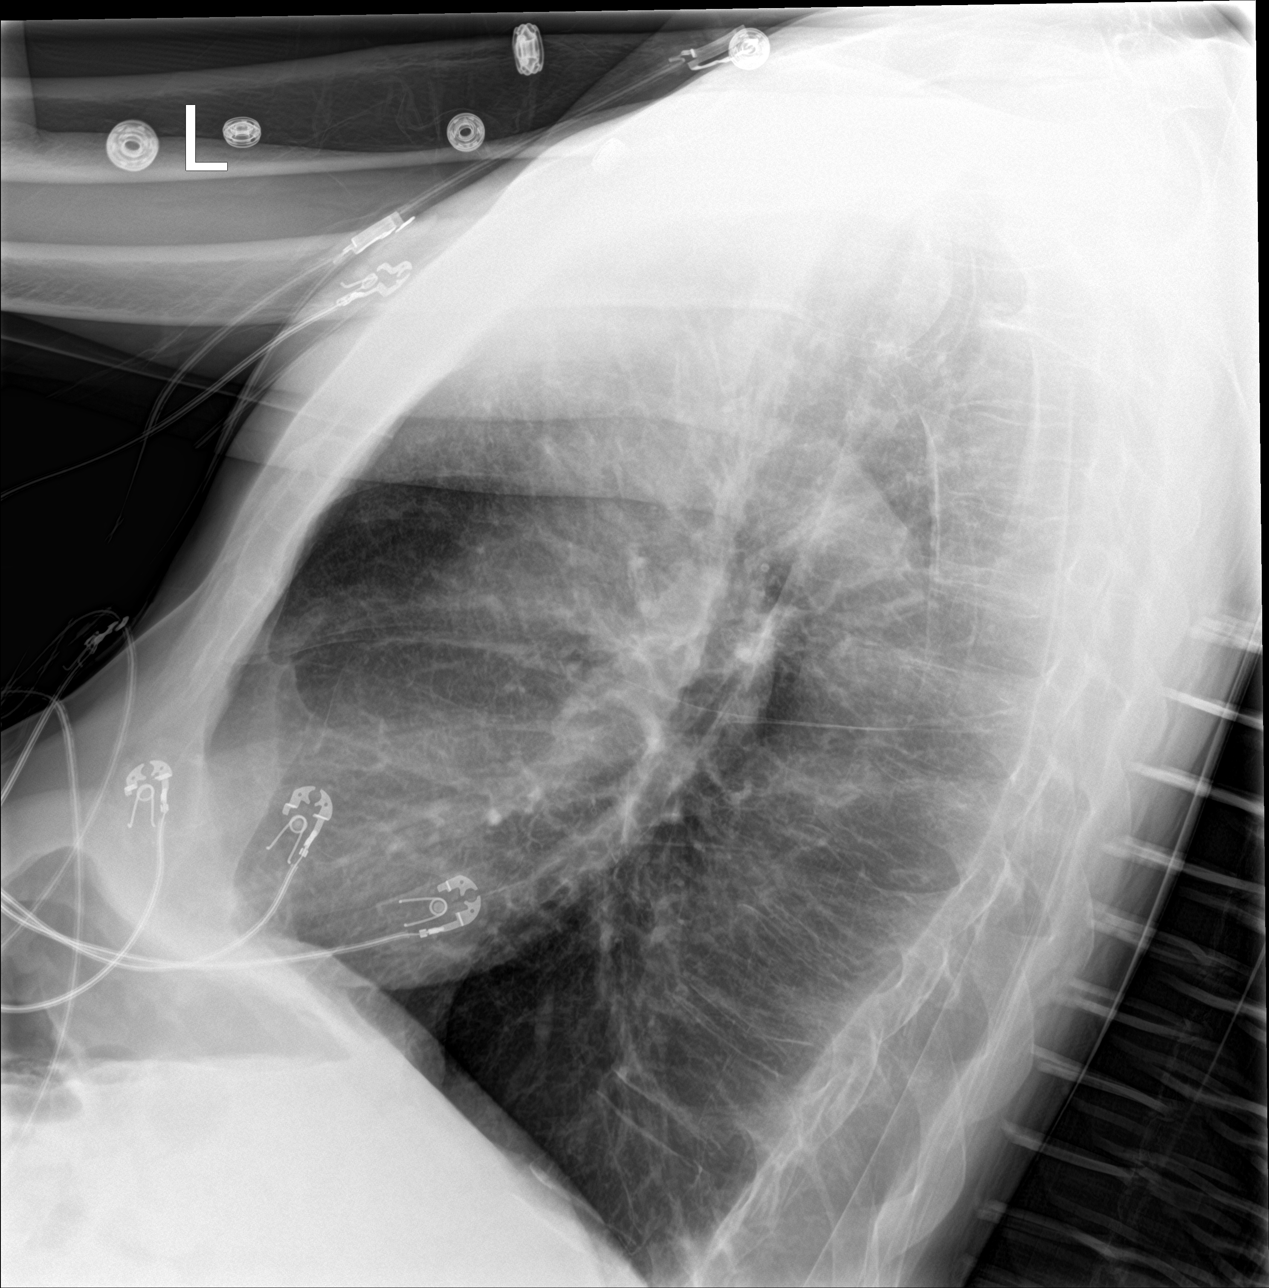

[chest ap (1 of 2)]
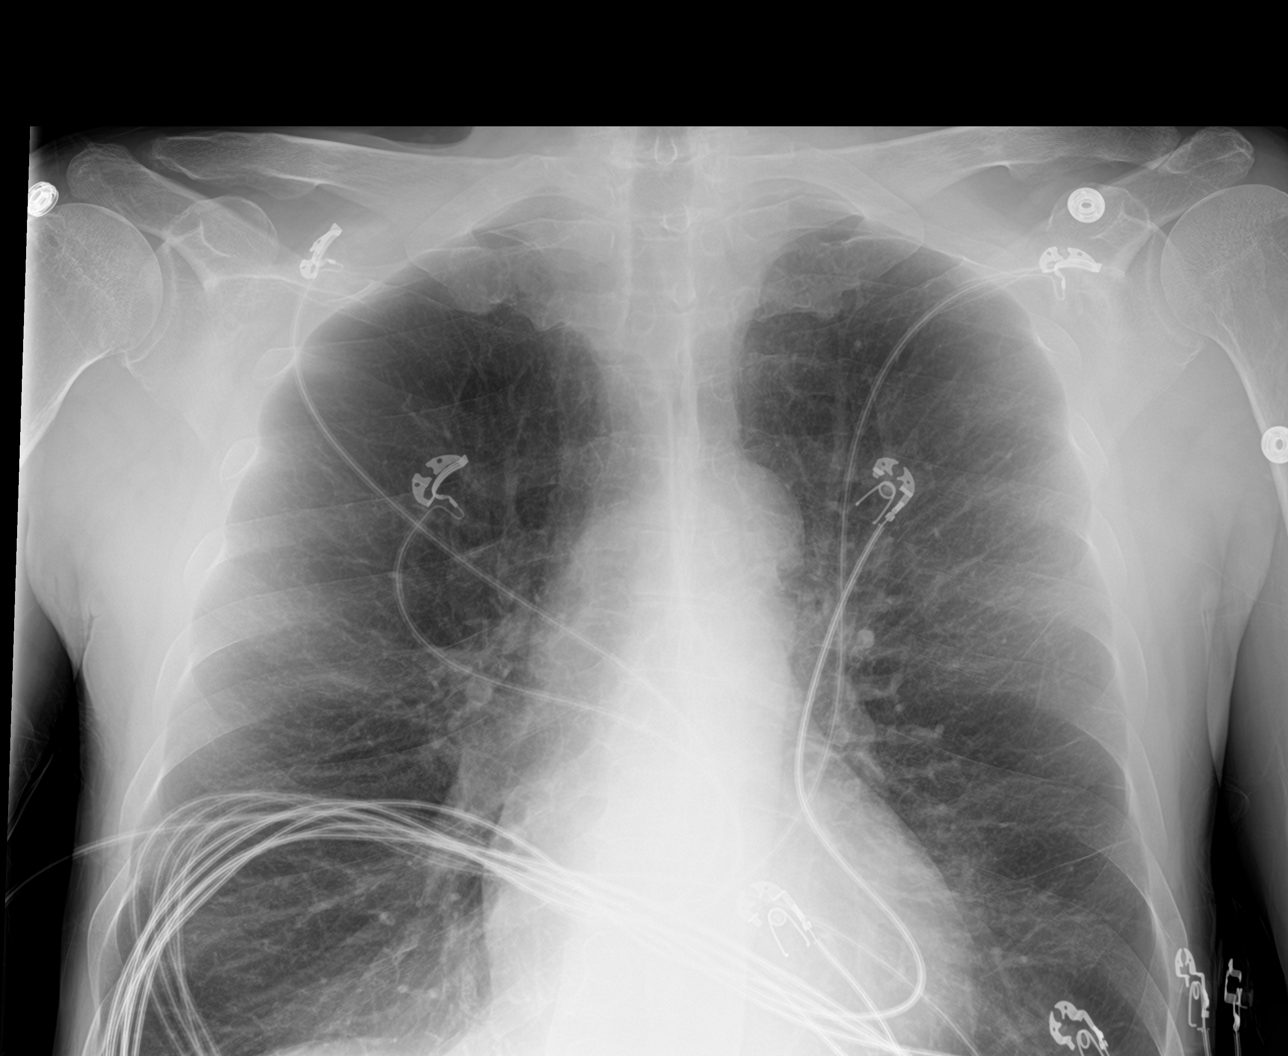

[chest ap (2 of 2)]
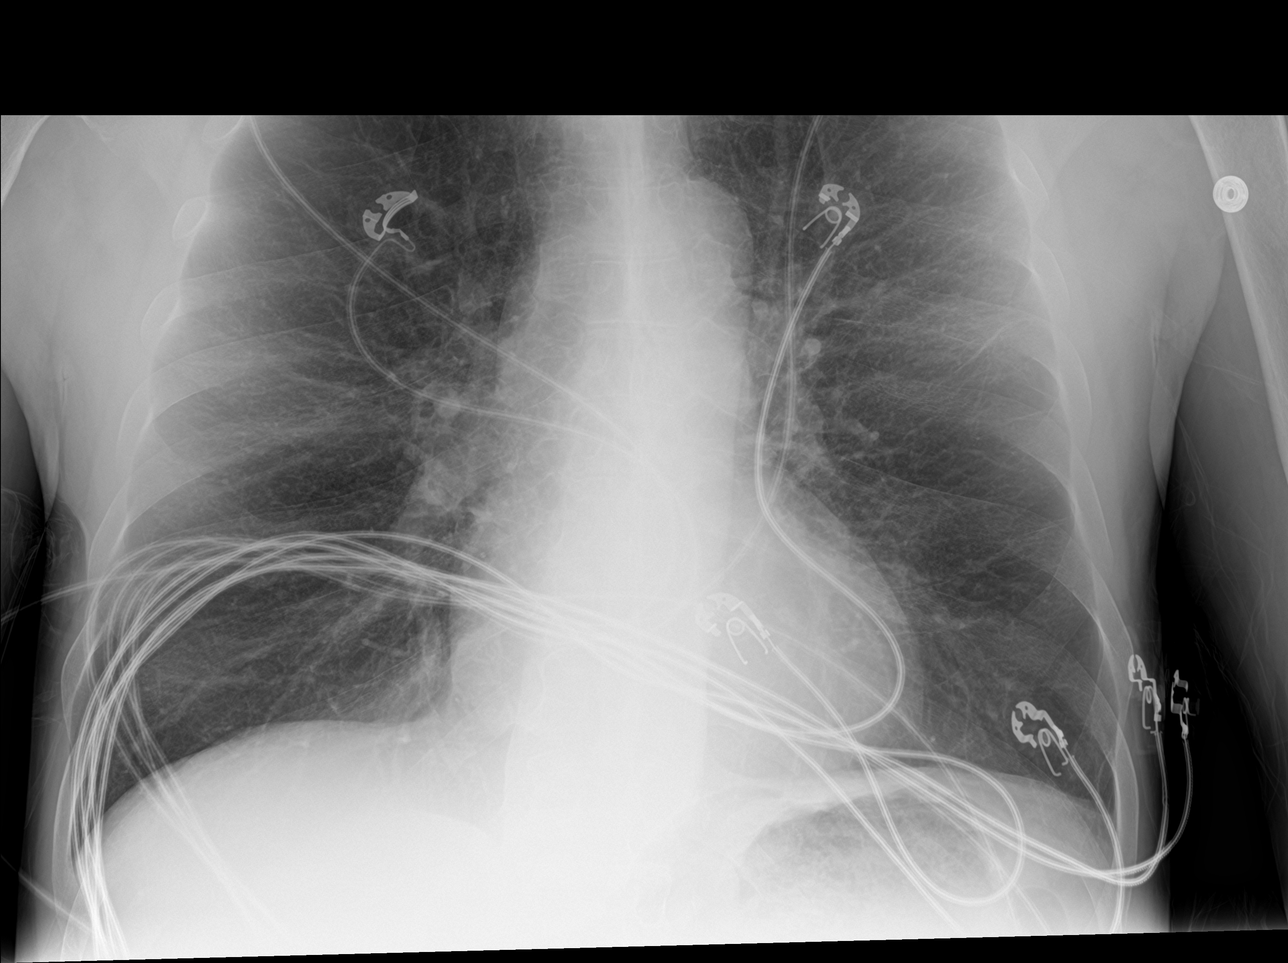

[3 of 3 positions shown; findings below may reference images not displayed]

FINDINGS: AP ERECT and LATERAL images were obtained. Cardiomediastinal
silhouette unremarkable and unchanged. Hyperinflation and
emphysematous changes throughout both lungs, unchanged. Mildly
prominent bronchovascular markings diffusely and mild central
peribronchial thickening, unchanged. Lungs otherwise clear. No
localized airspace consolidation. No pleural effusions. No
pneumothorax. Normal pulmonary vascularity. Visualized bony thorax
intact. No interval change over multiple prior examinations.
IMPRESSION: COPD/emphysema. No acute cardiopulmonary disease. Stable
examination.

## 2019-05-16 MED ORDER — ALBUTEROL SULFATE HFA 108 (90 BASE) MCG/ACT IN AERS
2.0000 | INHALATION_SPRAY | Freq: Once | RESPIRATORY_TRACT | Status: AC
  Administered 2019-05-16: 2 via RESPIRATORY_TRACT
  Filled 2019-05-16: qty 6.7

## 2019-05-16 MED ORDER — OXYCODONE-ACETAMINOPHEN 5-325 MG PO TABS
1.0000 | ORAL_TABLET | Freq: Once | ORAL | Status: AC
  Administered 2019-05-16: 1 via ORAL
  Filled 2019-05-16: qty 1

## 2019-05-16 MED ORDER — OXYCODONE-ACETAMINOPHEN 5-325 MG PO TABS: 1.0000 | ORAL_TABLET | ORAL | 0 refills | Status: DC | PRN

## 2019-05-16 MED ORDER — SODIUM CHLORIDE 0.9% FLUSH: 3.0000 mL | Freq: Once | INTRAVENOUS | Status: DC

## 2019-05-16 NOTE — ED Provider Notes (Signed)
[facility] Provider Note   CSN: [id number] Arrival date & time: 05/16/19  1246    History   Chief Complaint Chief Complaint  Patient presents with   Shortness of Breath   Chest Pain    HPI Tony Sullivan is a 58 y.o. male.     HPI   He is here for evaluation of back pain, after sustaining an injury to it while bending over and lifting something.  He felt a pop in his back.  He states this occurred 2 hours ago as he was attempting to get his electric wheelchair back on the sidewalk after inadvertently driving off the sidewalk.  He uses the electric wheelchair because of his COPD.  He is able to walk but typically gets short of breath with exertion so uses the wheelchair.  He denies new weakness, paresthesia, mid or upper back pain, or cervical pain.  He localizes the pain to his bilateral lower lumbar region, radiating to both lateral areas of the lower back.  There is no radiation of pain to the buttocks or legs.  He denies pre-existing injury to the spine.  There are no other known modifying factors.  Past Medical History:  Diagnosis Date   COPD (chronic obstructive pulmonary disease) ([place]) 2004   diagnosed in 2004, no PFT's to date.  Started on home O2 12/2013, after found to be desatting at PCP's office, and referred to pulmonology.   High blood pressure    Seasonal allergies    Tobacco dependence     Patient Active Problem List   Diagnosis Date Noted   Abdominal pain    Poor dentition 11/04/2017   Acute on chronic respiratory failure with hypoxia ([place]) 08/29/2017   ETOH abuse 03/30/2017   Cigarette smoker 03/30/2017   Acute respiratory failure with hypoxia and hypercapnia ([facility]) 03/29/2017   Special screening for malignant neoplasms, colon    Benign neoplasm of ascending colon    Benign neoplasm of descending colon    Hoarseness 08/22/2016   GERD (gastroesophageal reflux disease) 08/22/2016   Atypical chest  pain 12/05/2015   Essential hypertension 06/15/2014   Pectoralis muscle strain 01/11/2014   COPD with acute exacerbation ([place]) 01/10/2014   COPD  GOLD III 01/08/2014   Smoker 01/08/2014   Cough 01/08/2014   Chronic respiratory failure with hypoxia ([place]) 01/08/2014    Past Surgical History:  Procedure Laterality Date   APPENDECTOMY  1980   COLONOSCOPY WITH PROPOFOL N/A 12/10/2016   Procedure: COLONOSCOPY WITH PROPOFOL;  Surgeon: [name], MD;  Location: [facility];  Service: Endoscopy;  Laterality: N/A;   HIP SURGERY Right    SHOULDER ARTHROSCOPY Right         Home Medications    Prior to Admission medications   Medication Sig Start Date End Date Taking? Authorizing Provider  albuterol (PROAIR HFA) 108 (90 Base) MCG/ACT inhaler Inhale 1-2 puffs into the lungs every 4 (four) hours as needed. INHALE TWO PUFFS UP TO EVERY 4 HOURS IF NEEDED FOR SHORT OF BREATH 10/02/18   [name], MD  albuterol (PROVENTIL) (2.5 MG/3ML) 0.083% nebulizer solution Take 3 mLs (2.5 mg total) by nebulization every 4 (four) hours as needed for wheezing or shortness of breath. 04/28/18   [name], NP  amLODipine (NORVASC) 10 MG tablet Take 10 mg by mouth daily.     [provider]  bacitracin ointment Apply 1 application topically 2 (two) times daily. Apply to facial and intranasal  burns. 02/03/19   Joy, Shawn C, PA-C  benztropine (COGENTIN) 0.5 MG tablet Take 0.5 mg by mouth at bedtime.     [provider]  budesonide-formoterol (SYMBICORT) 160-4.5 MCG/ACT inhaler INHALE TWO PUFFS INTO THE LUNGS TWICE A DAY 03/05/19   [name], MD  cetirizine (ZYRTEC) 10 MG tablet Take 10 mg by mouth daily.    [provider]  docusate sodium (COLACE) 100 MG capsule Take 100 mg by mouth daily.     [provider]  famotidine (PEPCID) 20 MG tablet TAKE ONE TABLET BY MOUTH DAILY AT BEDTIME 02/18/19   [name], MD  fluticasone [facility]) 50 MCG/ACT nasal  spray Place 2 sprays into both nostrils 3 (three) times daily as needed for allergies.     [provider]  guaiFENesin (MUCINEX) 600 MG 12 hr tablet Take 1 tablet (600 mg total) by mouth 2 (two) times daily. 10/02/18   [name], MD  losartan (COZAAR) 100 MG tablet Take 100 mg by mouth daily.    [provider]  loxapine (LOXITANE) 10 MG capsule Take 10 mg by mouth at bedtime.     [provider]  mirtazapine (REMERON) 30 MG tablet Take 30 mg by mouth at bedtime.    [provider]  Multiple Vitamin (MULTIVITAMIN) capsule Take 1 capsule by mouth daily.    [provider]  nicotine (NICODERM CQ - DOSED IN MG/24 HOURS) 14 mg/24hr patch Place 1 patch (14 mg total) onto the skin daily. 10/03/18   [name], MD  OXYGEN Inhale 2 L into the lungs as directed. 2-3 liters with sleep and exertion as needed for low oxygen    [provider]  pantoprazole (PROTONIX) 40 MG tablet TAKE 1 TABLET (40 MG TOTAL) BY MOUTH DAILY. TAKE 30-60 MIN BEFORE FIRST MEAL OF THE DAY 05/13/19   [name], MD  predniSONE (DELTASONE) 10 MG tablet TAKE 2-4 TABLETS BY MOUTH PER DAY UNTIL BETTER. THEN TAKE 1 TABLET DAILY. 05/13/19   [name], MD  Respiratory Therapy Supplies (FLUTTER) DEVI Use as directed 12/21/18   [name], MD  risperiDONE (RISPERDAL) 2 MG tablet Take 2 mg by mouth at bedtime.    [provider]  sodium chloride (OCEAN) 0.65 % SOLN nasal spray Place 2 sprays into both nostrils 6 (six) times daily. 02/03/19   Joy, Shawn C, PA-C  Tiotropium Bromide Monohydrate (SPIRIVA RESPIMAT) 2.5 MCG/ACT AERS 2 pffs each  am Patient taking differently: Inhale 2 puffs into the lungs daily.  07/27/18   [name], MD    Family History Family History  Problem Relation Age of Onset   Emphysema Mother    Allergies Mother        "everyone in family"   Asthma Mother        "everyone in family"   Heart disease Mother    Clotting  disorder Mother    Cancer Mother    Emphysema Father    Allergies Father    Allergies Son    Heart disease Maternal Aunt     Social History Social History   Tobacco Use   Smoking status: Current Some Day Smoker    Packs/day: 2.00    Years: 36.00    Pack years: 72.00    Types: Cigarettes   Smokeless tobacco: Never Used  Substance Use Topics   Alcohol use: Yes    Alcohol/week: 14.0 - 21.0 standard drinks    Types:  14 - 21 Standard drinks or equivalent per week    Comment: 2-3 beers/day, with occasional "binges".  No history of withdrawal   Drug use: Yes    Types: Marijuana    Comment: daily     Allergies   Penicillins, Sulfa antibiotics, Famotidine, and Levocetirizine   Review of Systems Review of Systems  All other systems reviewed and are negative.    Physical Exam Updated Vital Signs BP (!) 129/91    Pulse 86    Temp 98.5 F (36.9 C) (Oral)    Resp (!) 21    SpO2 100%   Physical Exam Vitals signs and nursing note reviewed.  Constitutional:      General: He is not in acute distress.    Appearance: He is well-developed. He is not ill-appearing, toxic-appearing or diaphoretic.  HENT:     Head: Normocephalic and atraumatic.     Right Ear: External ear normal.     Left Ear: External ear normal.  Eyes:     Conjunctiva/sclera: Conjunctivae normal.     Pupils: Pupils are equal, round, and reactive to light.  Neck:     Musculoskeletal: Normal range of motion and neck supple.     Trachea: Phonation normal.  Cardiovascular:     Rate and Rhythm: Normal rate.  Pulmonary:     Effort: Pulmonary effort is normal. No respiratory distress.     Breath sounds: No stridor.  Musculoskeletal:     Comments: He guards against movement of the lower back secondary to pain.  There is mild tenderness of the lower lumbar region, diffusely.  Negative straight leg raising bilaterally.  Skin:    General: Skin is warm and dry.  Neurological:     Mental Status: He is alert  and oriented to person, place, and time.     Cranial Nerves: No cranial nerve deficit.     Sensory: No sensory deficit.     Motor: No abnormal muscle tone.     Coordination: Coordination normal.  Psychiatric:        Mood and Affect: Mood normal.        Behavior: Behavior normal.        Thought Content: Thought content normal.        Judgment: Judgment normal.      ED Treatments / Results  Labs (all labs ordered are listed, but only abnormal results are displayed) Labs Reviewed  BASIC METABOLIC PANEL - Abnormal; Notable for the following components:      Result Value   Glucose, Bld 103 (*)    All other components within normal limits  CBC - Abnormal; Notable for the following components:   WBC 14.0 (*)    MCV 100.8 (*)    All other components within normal limits  TROPONIN I (HIGH SENSITIVITY)  TROPONIN I (HIGH SENSITIVITY)    EKG EKG Interpretation  Date/Time:  Sunday May 16 2019 13:02:19 EDT Ventricular Rate:  88 PR Interval:  136 QRS Duration: 74 QT Interval:  354 QTC Calculation: 428 R Axis:   -80 Text Interpretation:  Normal sinus rhythm with sinus arrhythmia Right atrial enlargement Left axis deviation Pulmonary disease pattern Abnormal ECG since last tracing no significant change Confirmed by Daleen Bo [phone]) on 05/16/2019 2:52:56 PM   Radiology Dg Chest 2 View  Result Date: 05/16/2019 CLINICAL DATA:  58 year old with acute onset of shortness of breath and chest tightness. Current smoker with current history of COPD/emphysema. EXAM: CHEST - 2 VIEW COMPARISON:  02/03/2019 and  earlier. FINDINGS: AP ERECT and LATERAL images were obtained. Cardiomediastinal silhouette unremarkable and unchanged. Hyperinflation and emphysematous changes throughout both lungs, unchanged. Mildly prominent bronchovascular markings diffusely and mild central peribronchial thickening, unchanged. Lungs otherwise clear. No localized airspace consolidation. No pleural effusions. No  pneumothorax. Normal pulmonary vascularity. Visualized bony thorax intact. No interval change over multiple prior examinations. IMPRESSION: COPD/emphysema. No acute cardiopulmonary disease. Stable examination. Electronically Signed   By: Evangeline Dakin M.D.   On: 05/16/2019 15:10   Dg Lumbar Spine Complete  Result Date: 05/16/2019 CLINICAL DATA:  58 year old who heard an audible pop in his low back when trying to pick up his wheelchair earlier today. Low back pain which is worse with motion. Initial encounter. EXAM: LUMBAR SPINE - COMPLETE 4+ VIEW COMPARISON:  Bone window images from CT abdomen and pelvis 06/12/2015. FINDINGS: Five non-rib-bearing lumbar vertebrae with a transitional S1 segment having a well-defined disc space between it and S2. This is confirmed by the prior chest x-rays which have demonstrated 12 rib-bearing thoracic vertebrae. Anatomic alignment. No fractures. Straightening of the usual lordosis. Mild disc space narrowing at L3-4 and L5-S1, progressive since 2016. No pars defects. Mild facet degenerative changes at L4-5 and L5-S1. Sacroiliac joints anatomically aligned without significant degenerative changes. Aortoiliac atherosclerosis without evidence of aneurysm. IMPRESSION: 1. No acute osseous abnormality. 2. Straightening of the usual lordosis which may reflect positioning and/or spasm. 3. Mild degenerative disc disease at L3-4 and L5-S1, progressive since 2016. 4. Mild facet degenerative changes at L4-5 and L5-S1. Aortic Atherosclerosis (ICD10-170.0) Electronically Signed   By: Evangeline Dakin M.D.   On: 05/16/2019 15:13    Procedures Procedures (including critical care time)  Medications Ordered in ED Medications  sodium chloride flush (NS) 0.9 % injection 3 mL (0 mLs Intravenous Hold 05/16/19 1416)  oxyCODONE-acetaminophen (PERCOCET/ROXICET) 5-325 MG per tablet 1 tablet (1 tablet Oral Given 05/16/19 1414)     Initial Impression / Assessment and Plan / ED Course  I have  reviewed the triage vital signs and the nursing notes.  Pertinent labs & imaging results that were available during my care of the patient were reviewed by me and considered in my medical decision making (see chart for details).  Clinical Course as of May 15 1646  Sun May 16, 2019  1645 Normal  Troponin I (High Sensitivity) [EW]  1646 Normal except elevation of white count and MCV  CBC(!) [EW]  1646 Normal  Basic metabolic panel(!) [EW]  2505 No infiltrate or CHF, images reviewed by me  DG Chest 2 View [EW]  3976 DJD present without fracture or acute abnormality.  Images reviewed by me  DG Lumbar Spine Complete [EW]    Clinical Course User Index [EW] Daleen Bo, MD        Patient Vitals for the past 24 hrs:  BP Temp Temp src Pulse Resp SpO2  05/16/19 1415 (!) 129/91 -- -- 86 (!) 21 100 %  05/16/19 1400 120/82 -- -- 77 18 100 %  05/16/19 1259 112/74 98.5 F (36.9 C) Oral 88 (!) 22 99 %    4:41 PM Reevaluation with update and discussion. After initial assessment and treatment, an updated evaluation reveals he is more comfortable now.  He requested an albuterol inhaler, because he states he lost his current one.  Findings discussed with the patient and all questions were answered. Daleen Bo   Medical Decision Making: Patient evaluated today primarily for injury from lifting his wheelchair.  He also reported to nursing, but  not to me that he was having some trouble breathing.  Patient does have chronic shortness of breath related to tobacco abuse with underlying COPD.  He follows closely with his pulmonologist and is using prednisone, between 10 and 20 mg a day based on his symptoms.  No evidence for serious bacterial infection, acute coronary syndrome or impending vascular collapse.  CRITICAL CARE-no Performed by: Daleen Bo  Nursing Notes Reviewed/ Care Coordinated Applicable Imaging Reviewed Interpretation of Laboratory Data incorporated into ED treatment  The  patient appears reasonably screened and/or stabilized for discharge and I doubt any other medical condition or other [facility] requiring further screening, evaluation, or treatment in the ED at this time prior to discharge.  Plan: Home Medications-continue usual medications; Home Treatments-rest, cryotherapy; return here if the recommended treatment, does not improve the symptoms; Recommended follow up-PCP, PRN   Final Clinical Impressions(s) / ED Diagnoses   Final diagnoses:  Back strain, initial encounter  Chronic obstructive pulmonary disease, unspecified COPD type [facility])    ED Discharge Orders    None       Daleen Bo, MD 05/16/19 1650

## 2019-05-16 NOTE — ED Notes (Signed)
Patient Alert and oriented to baseline. Stable and ambulatory to baseline. Patient verbalized understanding of the discharge instructions.  Patient belongings were taken by the patient.   

## 2019-05-16 NOTE — Discharge Instructions (Signed)
The x-ray did not show any serious problems today.  You do have some arthritis there which may be contributing to your discomfort.  To help we are prescribing a pain reliever.  Also using ice on the sore area 3 or 4 times a day will help.  The testing done to look at your lungs and heart was reassuring.  Continue using your usual treatments for COPD.  F also you should try to stop smoking.  Ollow-up with your primary care doctor if you continue to have trouble over the next few days.

## 2019-05-16 NOTE — ED Triage Notes (Addendum)
C/o back pain  After lifting  W/c and feels sob and having chest tightness  felt his back pop is on  o2 at 2- 3 all the time in an electric w/c

## 2019-06-16 ENCOUNTER — Telehealth: Payer: Self-pay | Admitting: Internal Medicine

## 2019-06-16 MED ORDER — AZITHROMYCIN 250 MG PO TABS: ORAL_TABLET | ORAL | 0 refills | Status: DC

## 2019-06-16 NOTE — Telephone Encounter (Signed)
Ok to rx zpak x one but without seeing him it's hard to know what this chest tightness really  is so if not improving with the meds and recs we already made will need to return to office or go to ER

## 2019-06-16 NOTE — Telephone Encounter (Signed)
Spoke with patient. He stated that the tightness in his chest has returned. This is day 3 of the tightness. He has been using his Symbicort 160, Spiriva and prednisone. Denied any fever, chest pain or SOB. He stated that he has had this sensation before and MW prescribed him a zpak and this worked for him. He wants to know if MW will call in another zpak.   Pharmacy is [facility].   MW, please advise. Thanks!

## 2019-06-16 NOTE — Telephone Encounter (Signed)
Called and spoke w/ pt regarding MW's recommendations. Pt verbalized understanding with no additional questions. Rx for Zpak has been sent to pt's preferred pharmacy. Pt is aware to call our office back if he is not improving after this therapy. Nothing further needed at this time.

## 2019-06-30 ENCOUNTER — Ambulatory Visit: Payer: Medicaid Other | Admitting: Physician Assistant

## 2019-07-02 ENCOUNTER — Emergency Department (HOSPITAL_COMMUNITY): Payer: Medicaid Other

## 2019-07-02 ENCOUNTER — Emergency Department (HOSPITAL_BASED_OUTPATIENT_CLINIC_OR_DEPARTMENT_OTHER): Payer: Medicaid Other

## 2019-07-02 ENCOUNTER — Other Ambulatory Visit: Payer: Self-pay

## 2019-07-02 ENCOUNTER — Encounter (HOSPITAL_COMMUNITY): Payer: Self-pay | Admitting: *Deleted

## 2019-07-02 ENCOUNTER — Emergency Department (HOSPITAL_COMMUNITY)
Admission: EM | Admit: 2019-07-02 | Discharge: 2019-07-02 | Disposition: A | Discharge status: Home / Self Care | Payer: Medicaid Other | Attending: Emergency Medicine | Admitting: Emergency Medicine

## 2019-07-02 DIAGNOSIS — D122 Benign neoplasm of ascending colon: Secondary | ICD-10-CM | POA: Insufficient documentation

## 2019-07-02 DIAGNOSIS — M79661 Pain in right lower leg: Secondary | ICD-10-CM | POA: Insufficient documentation

## 2019-07-02 DIAGNOSIS — M79662 Pain in left lower leg: Secondary | ICD-10-CM | POA: Diagnosis not present

## 2019-07-02 DIAGNOSIS — I10 Essential (primary) hypertension: Secondary | ICD-10-CM | POA: Insufficient documentation

## 2019-07-02 DIAGNOSIS — R079 Chest pain, unspecified: Secondary | ICD-10-CM | POA: Diagnosis present

## 2019-07-02 DIAGNOSIS — R52 Pain, unspecified: Secondary | ICD-10-CM | POA: Diagnosis not present

## 2019-07-02 DIAGNOSIS — F17210 Nicotine dependence, cigarettes, uncomplicated: Secondary | ICD-10-CM | POA: Insufficient documentation

## 2019-07-02 DIAGNOSIS — J441 Chronic obstructive pulmonary disease with (acute) exacerbation: Secondary | ICD-10-CM | POA: Diagnosis not present

## 2019-07-02 DIAGNOSIS — D124 Benign neoplasm of descending colon: Secondary | ICD-10-CM | POA: Insufficient documentation

## 2019-07-02 DIAGNOSIS — I739 Peripheral vascular disease, unspecified: Secondary | ICD-10-CM

## 2019-07-02 DIAGNOSIS — Z79899 Other long term (current) drug therapy: Secondary | ICD-10-CM | POA: Insufficient documentation

## 2019-07-02 DIAGNOSIS — Z20828 Contact with and (suspected) exposure to other viral communicable diseases: Secondary | ICD-10-CM | POA: Insufficient documentation

## 2019-07-02 LAB — CBC
HCT: 46.1 % (ref 39.0–52.0)
Hemoglobin: 14.5 g/dL (ref 13.0–17.0)
MCH: 31.9 pg (ref 26.0–34.0)
MCHC: 31.5 g/dL (ref 30.0–36.0)
MCV: 101.5 fL — ABNORMAL HIGH (ref 80.0–100.0)
Platelets: 270 10*3/uL (ref 150–400)
RBC: 4.54 MIL/uL (ref 4.22–5.81)
RDW: 14.8 % (ref 11.5–15.5)
WBC: 12.4 10*3/uL — ABNORMAL HIGH (ref 4.0–10.5)
nRBC: 0 % (ref 0.0–0.2)

## 2019-07-02 LAB — SARS CORONAVIRUS 2 (TAT 6-24 HRS): SARS Coronavirus 2: NEGATIVE

## 2019-07-02 LAB — COMPREHENSIVE METABOLIC PANEL
ALT: 27 U/L (ref 0–44)
AST: 24 U/L (ref 15–41)
Albumin: 3.4 g/dL — ABNORMAL LOW (ref 3.5–5.0)
Alkaline Phosphatase: 111 U/L (ref 38–126)
Anion gap: 9 (ref 5–15)
BUN: 13 mg/dL (ref 6–20)
CO2: 26 mmol/L (ref 22–32)
Calcium: 8.9 mg/dL (ref 8.9–10.3)
Chloride: 105 mmol/L (ref 98–111)
Creatinine, Ser: 0.9 mg/dL (ref 0.61–1.24)
GFR calc Af Amer: >60 mL/min (ref >60)
GFR calc non Af Amer: >60 mL/min (ref >60)
Glucose, Bld: 109 mg/dL — ABNORMAL HIGH (ref 70–99)
Potassium: 3.6 mmol/L (ref 3.5–5.1)
Sodium: 140 mmol/L (ref 135–145)
Total Bilirubin: 0.4 mg/dL (ref 0.3–1.2)
Total Protein: 6.7 g/dL (ref 6.5–8.1)

## 2019-07-02 LAB — TROPONIN I (HIGH SENSITIVITY)
Troponin I (High Sensitivity): 7 ng/L (ref <18)
Troponin I (High Sensitivity): 6 ng/L (ref <18)

## 2019-07-02 IMAGING — CT CT ANGIO NECK
1 of 8 series · 6 of 33 positions shown · IV contrast (OMNI 350)
Comparison: None.

CLINICAL DATA: Left arm tingling. The left upper extremity is cool
to touch. Abnormal vascular ultrasound study.

EXAM:
CT ANGIOGRAPHY NECK
TECHNIQUE: Multidetector CT imaging of the neck was performed using the
standard protocol during bolus administration of intravenous
contrast. Multiplanar CT image reconstructions and MIPs were
obtained to evaluate the vascular anatomy. Carotid stenosis
measurements (when applicable) are obtained utilizing NASCET
criteria, using the distal internal carotid diameter as the
denominator.
CONTRAST:  100mL OMNIPAQUE IOHEXOL 350 MG/ML SOLN

[Series 6: cta neck thins · axial · 0.52mm/px · z∈[+1165,+1378]mm · 6 of 747 slices shown]
[im 107/747  soft-tissue]
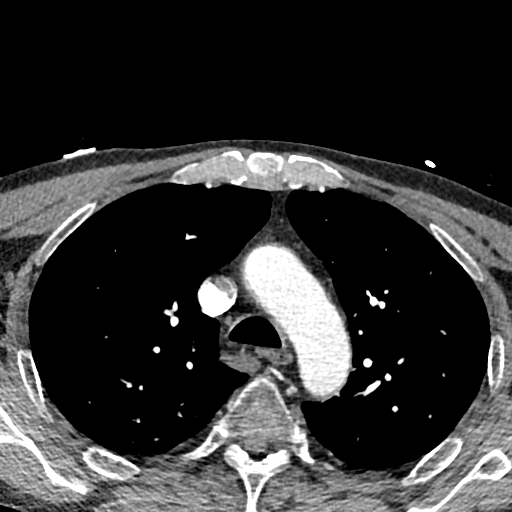
[im 214/747  bone]
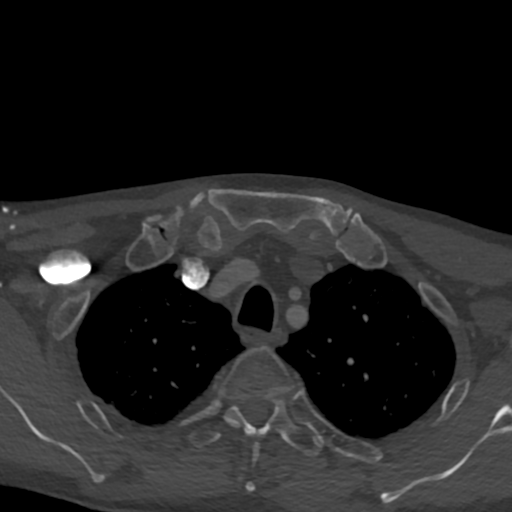
[im 320/747  soft-tissue]
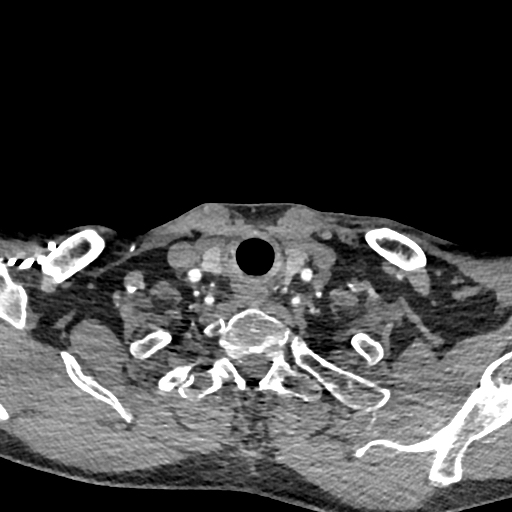
[im 427/747  bone]
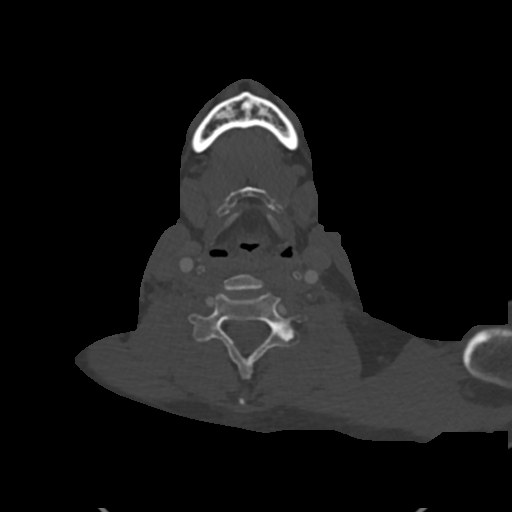
[im 533/747  soft-tissue]
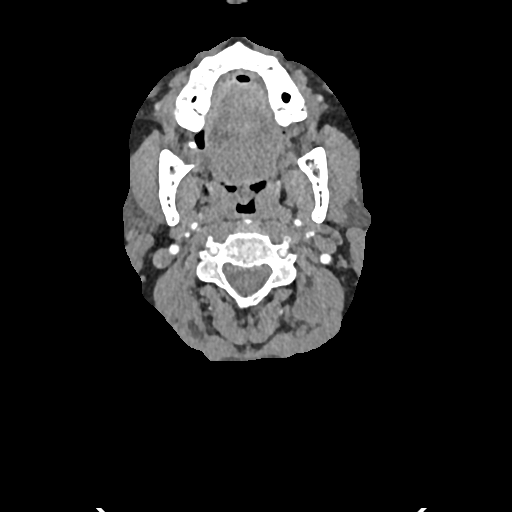
[im 640/747  bone]
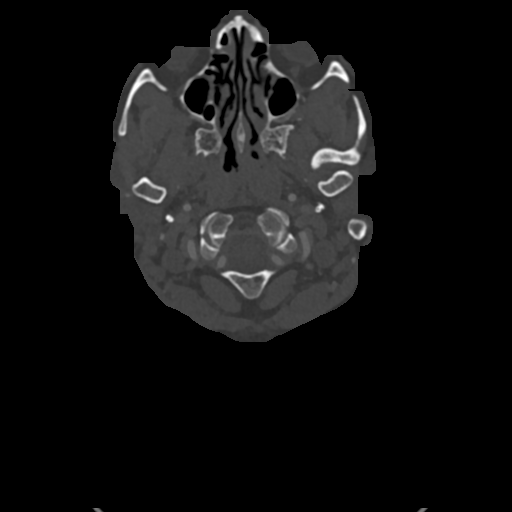

[6 of 33 positions shown; findings below may reference images not displayed]

FINDINGS: Aortic arch: There is a common origin of the left common carotid
artery in the innominate artery. Atherosclerotic calcifications are
present at the origin the left subclavian artery without a
significant stenosis. Left subclavian artery is otherwise
unremarkable. The visualized left axillary artery is normal as well.

Right carotid system: Right common carotid artery is within normal
limits. Minimal atherosclerotic changes are present in the proximal
right ICA without a significant stenosis. The more distal cervical
right ICA is normal. The visualized intracranial right ICA
demonstrates some atherosclerotic change without a significant
stenosis.

Left carotid system: The left common carotid artery is within normal
limits. Minimal atherosclerotic changes are present in the posterior
left ICA without a significant stenosis. More distal cervical left
ICA is normal. Visualized intracranial ICA is normal.

Vertebral arteries: Both vertebral arteries originate from the
subclavian arteries. There is no significant stenosis proximally.
There is no significant stenosis of either vertebral artery in the
neck. The PICA origins are visualized and normal bilaterally.
Basilar artery is unremarkable.

Skeleton: There is straightening of the normal cervical lordosis.
Grade 1 anterolisthesis present at C4-5. There is chronic loss of
disc height and endplate sclerosis at C6-7 and C7-T1. Mild foraminal
narrowing is present at both of these levels. No focal lytic or
blastic lesions are present.

Other neck: The visualized soft tissues the neck are otherwise
within normal limits.

Upper chest: Centrilobular emphysematous changes are present
bilaterally. No focal nodule, mass, or airspace disease is present.
The thoracic inlet is otherwise within normal limits.
IMPRESSION: 1. Atherosclerotic changes at the origin of the left subclavian
artery without a significant stenosis. No significant stenosis in
the left subclavian artery or axillary artery to explain vascular
symptoms in the left upper extremity.
2. No significant stenosis proximal to the left vertebral artery to
suggest subclavian steal syndrome.
3. Mild atherosclerotic changes in the proximal internal carotid
arteries bilaterally without a significant stenosis relative to the
more distal vessels.
4. Vertebral arteries are within normal limits bilaterally.
5.  Emphysema ([F9]-[F9]).

## 2019-07-02 IMAGING — DX DG CHEST 2V
4 series · 4 of 4 positions shown · non-contrast
Comparison: [DATE]

CLINICAL DATA: Chest pain

EXAM:
CHEST - 2 VIEW

[w chest lat (1 of 2)]
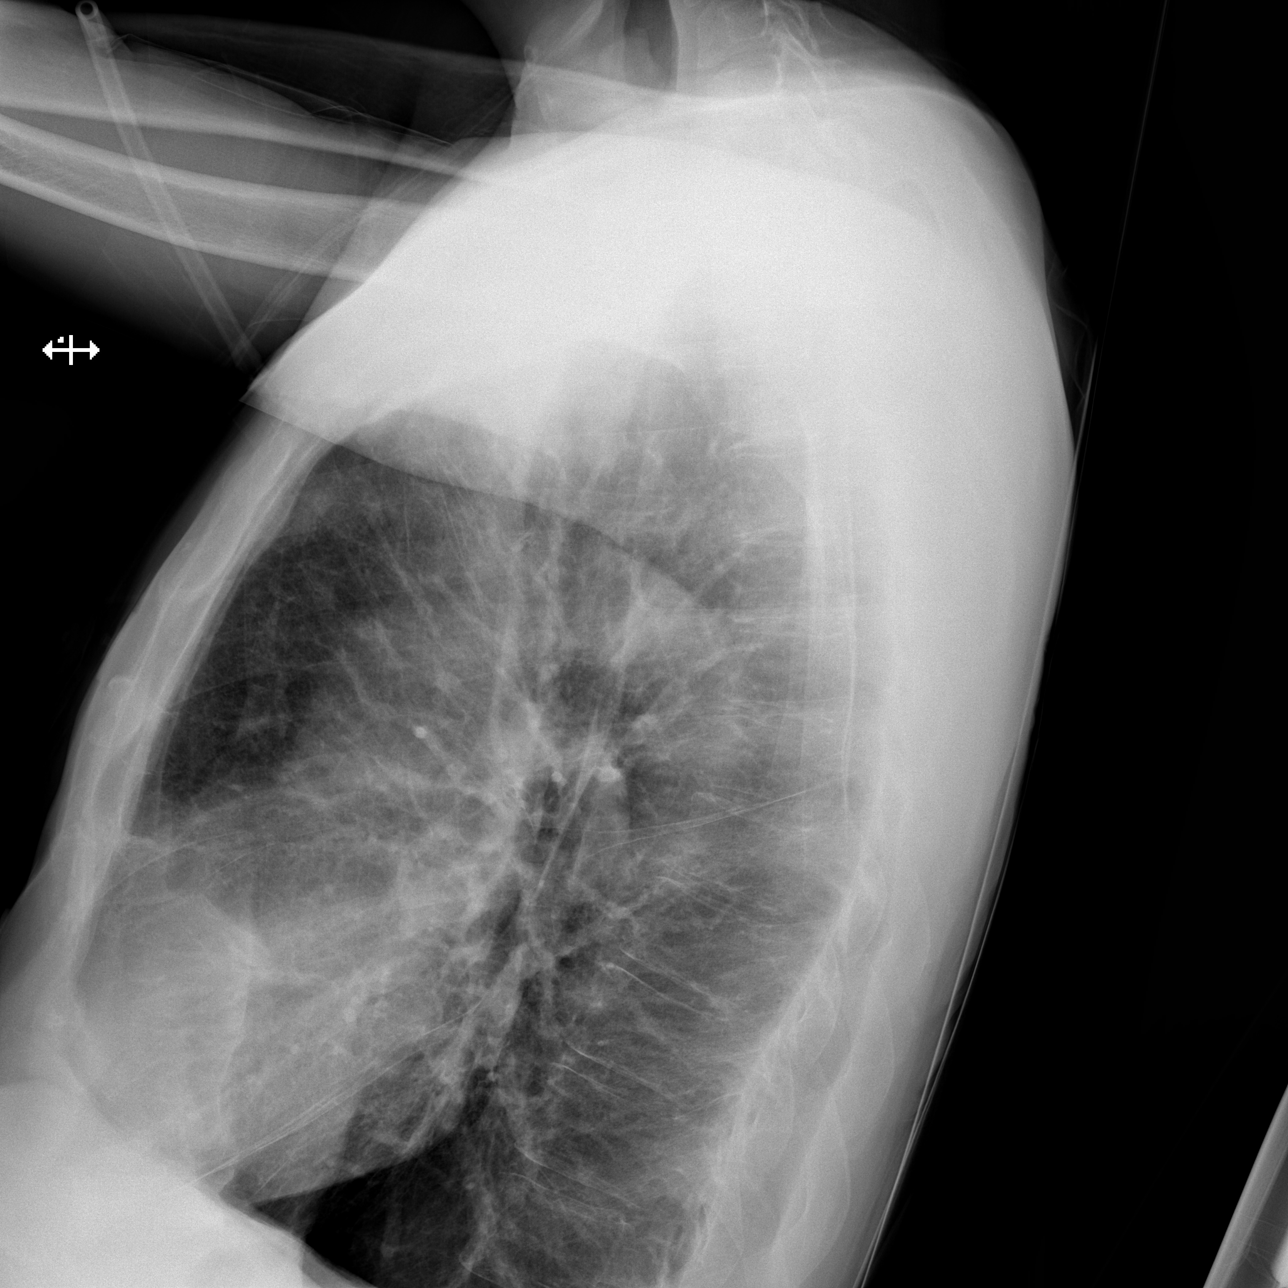

[w chest lat (2 of 2)]
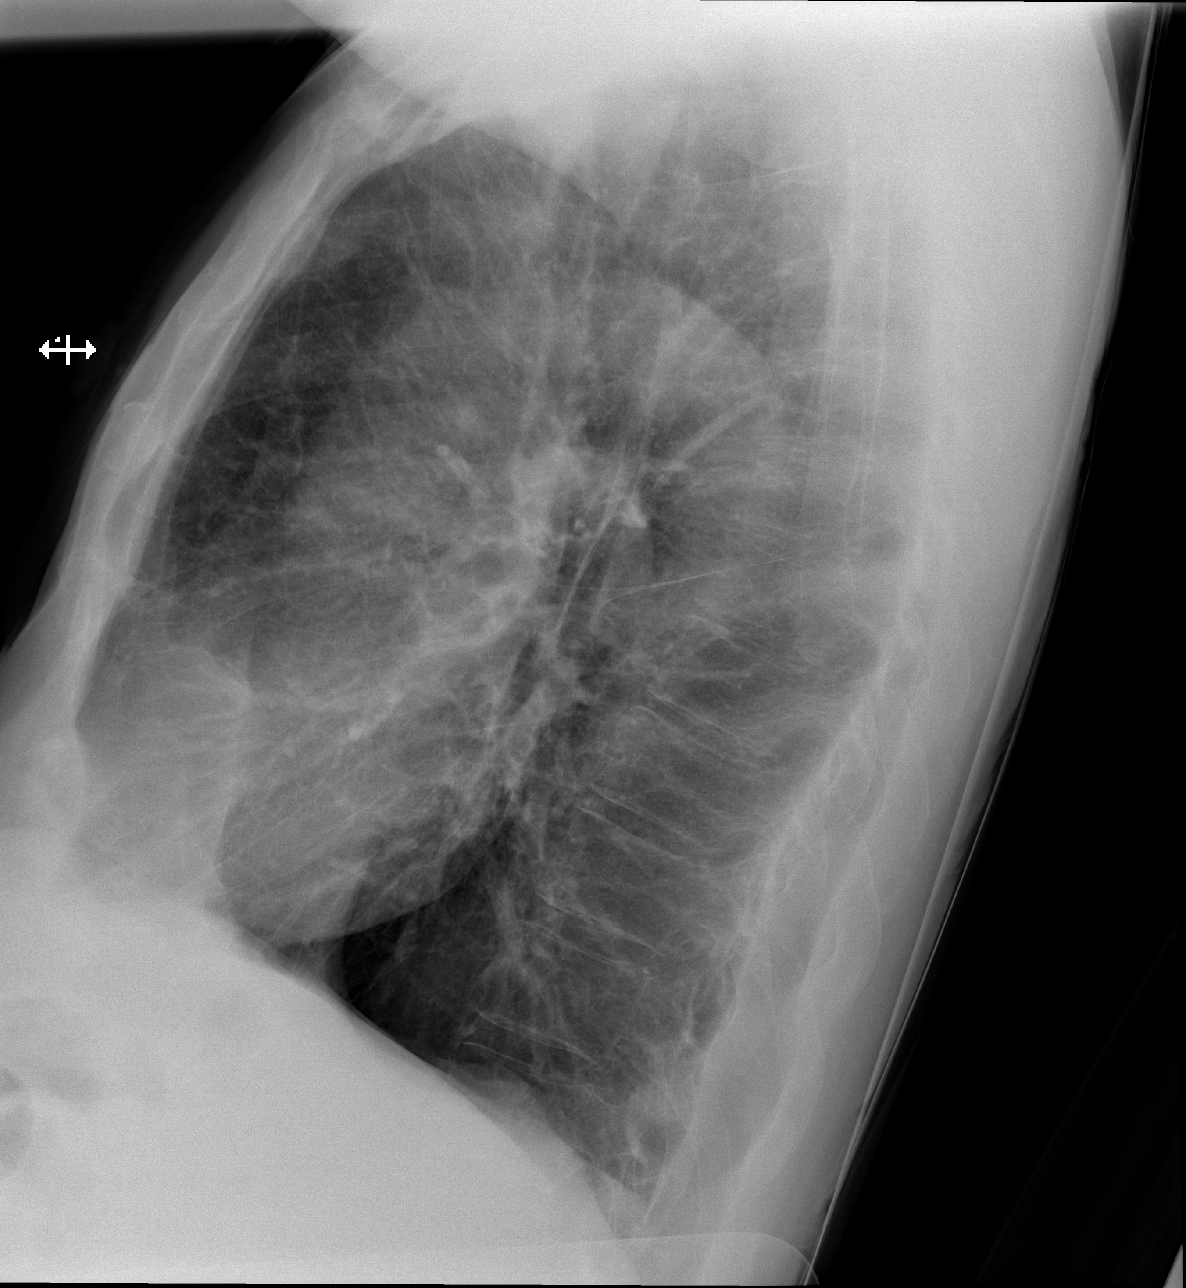

[x chest ap (1 of 2)]
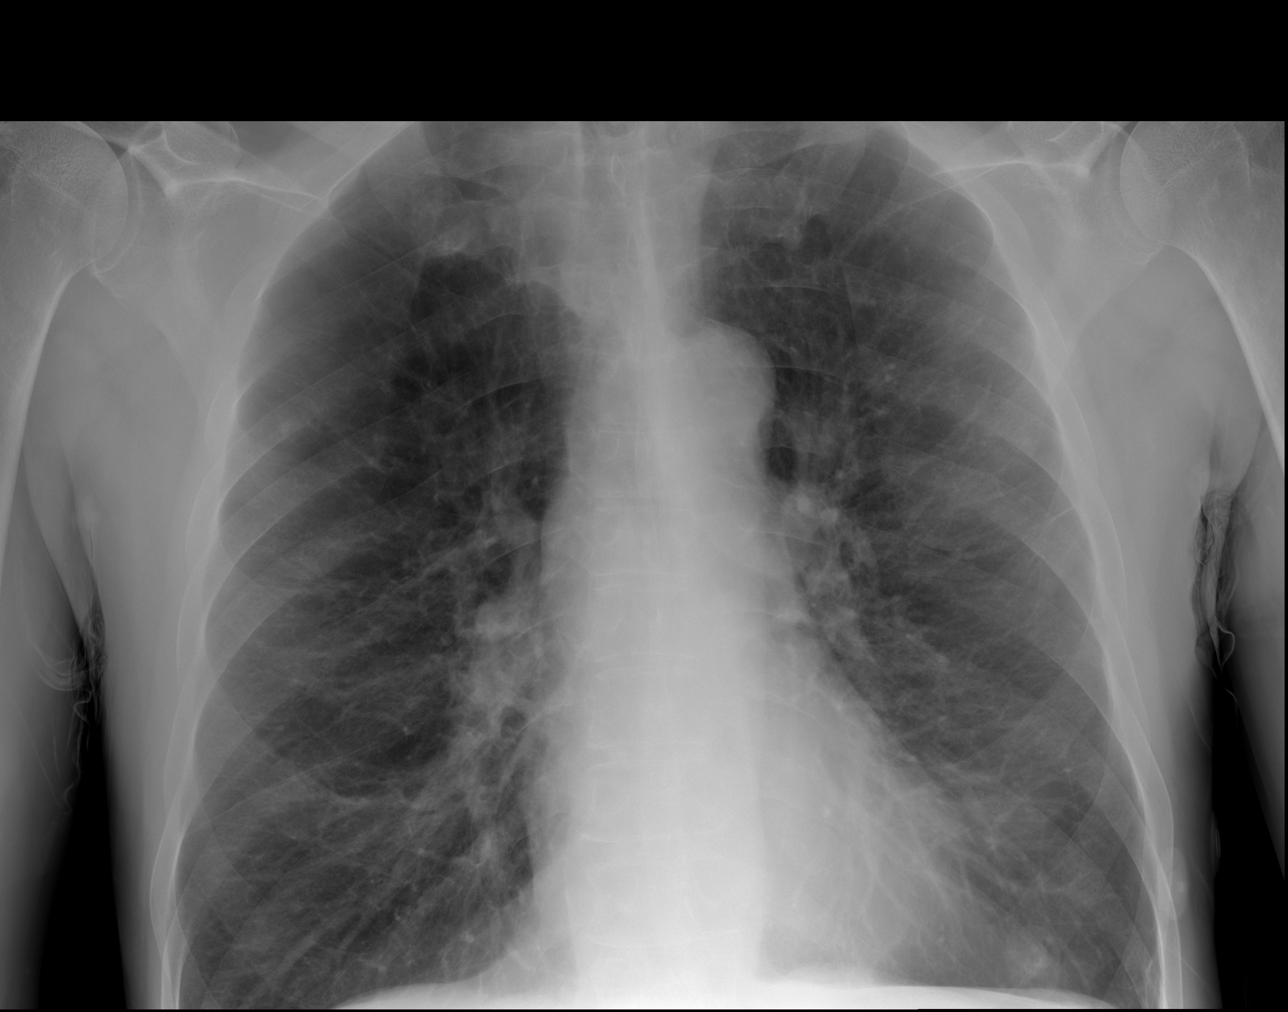

[x chest ap (2 of 2)]
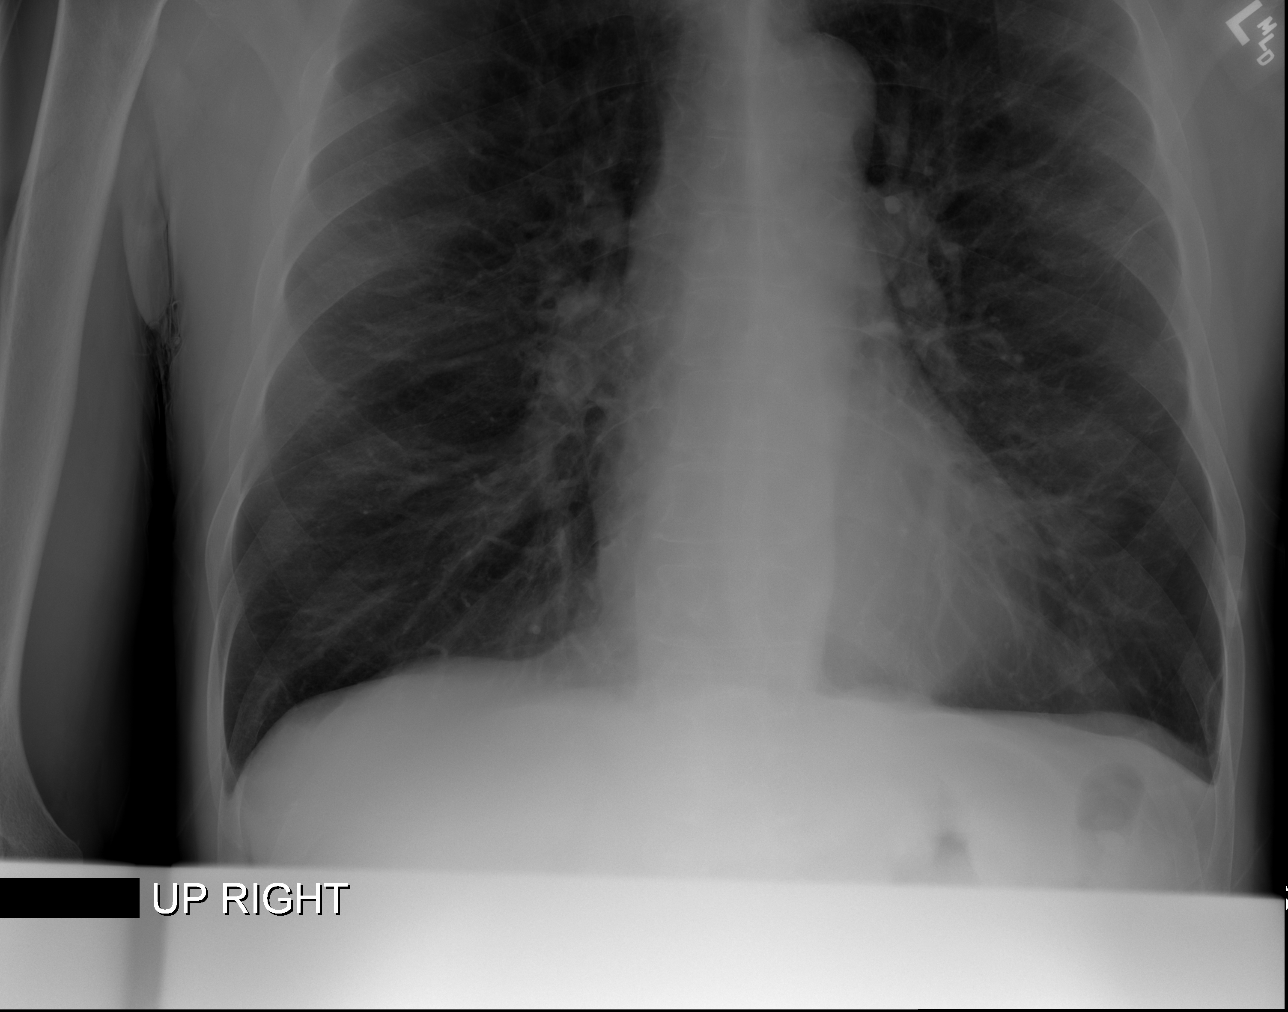

[4 of 4 positions shown; findings below may reference images not displayed]

FINDINGS: Lungs are hyperinflated consistent with COPD. Cardiac shadow is
within normal limits. No focal infiltrate or sizable effusion is
seen. Extrinsic leads are noted bilaterally. No bony abnormality is
seen.
IMPRESSION: COPD without acute abnormality.

## 2019-07-02 MED ORDER — PREDNISONE 20 MG PO TABS: 40.0000 mg | ORAL_TABLET | Freq: Every day | ORAL | 0 refills | Status: DC

## 2019-07-02 MED ORDER — BUDESONIDE 180 MCG/ACT IN AEPB
1.0000 | INHALATION_SPRAY | Freq: Two times a day (BID) | RESPIRATORY_TRACT | Status: DC
  Administered 2019-07-02: 1 via RESPIRATORY_TRACT
  Filled 2019-07-02: qty 1

## 2019-07-02 MED ORDER — IOHEXOL 350 MG/ML SOLN
100.0000 mL | Freq: Once | INTRAVENOUS | Status: AC | PRN
  Administered 2019-07-02: 100 mL via INTRAVENOUS

## 2019-07-02 MED ORDER — ALBUTEROL SULFATE HFA 108 (90 BASE) MCG/ACT IN AERS
2.0000 | INHALATION_SPRAY | Freq: Four times a day (QID) | RESPIRATORY_TRACT | Status: DC
  Administered 2019-07-02: 2 via RESPIRATORY_TRACT
  Filled 2019-07-02: qty 6.7

## 2019-07-02 MED ORDER — SODIUM CHLORIDE 0.9% FLUSH: 3.0000 mL | Freq: Once | INTRAVENOUS | Status: DC

## 2019-07-02 MED ORDER — PREDNISONE 20 MG PO TABS
60.0000 mg | ORAL_TABLET | ORAL | Status: AC
  Administered 2019-07-02: 60 mg via ORAL
  Filled 2019-07-02: qty 3

## 2019-07-02 NOTE — ED Provider Notes (Signed)
Vascular ultrasound notes PVD in legs, but concern for steal syndrome in left upper extremity (153 vs 133 pressures). Consulted radiology, will get MRA neck for further eval. Patient does note this has been for months.   Based on the patient having a covid test pending, Radiology asks for MRA to be switched to CTA of neck (for decon procedures), should still get information needed.  CT without occlusion or obvious compression to cause decreased ABI. This sounds more chronic, will refer to vascular. D/c home.  Results for orders placed or performed during the hospital encounter of 07/02/19  SARS CORONAVIRUS 2 (TAT 6-24 HRS) Nasopharyngeal Nasopharyngeal Swab   Specimen: Nasopharyngeal Swab  Result Value Ref Range   SARS Coronavirus 2 NEGATIVE NEGATIVE  CBC  Result Value Ref Range   WBC 12.4 (H) 4.0 - 10.5 K/uL   RBC 4.54 4.22 - 5.81 MIL/uL   Hemoglobin 14.5 13.0 - 17.0 g/dL   HCT 46.1 39.0 - 52.0 %   MCV 101.5 (H) 80.0 - 100.0 fL   MCH 31.9 26.0 - 34.0 pg   MCHC 31.5 30.0 - 36.0 g/dL   RDW 14.8 11.5 - 15.5 %   Platelets 270 150 - 400 K/uL   nRBC 0.0 0.0 - 0.2 %  Comprehensive metabolic panel  Result Value Ref Range   Sodium 140 135 - 145 mmol/L   Potassium 3.6 3.5 - 5.1 mmol/L   Chloride 105 98 - 111 mmol/L   CO2 26 22 - 32 mmol/L   Glucose, Bld 109 (H) 70 - 99 mg/dL   BUN 13 6 - 20 mg/dL   Creatinine, Ser 0.90 0.61 - 1.24 mg/dL   Calcium 8.9 8.9 - 10.3 mg/dL   Total Protein 6.7 6.5 - 8.1 g/dL   Albumin 3.4 (L) 3.5 - 5.0 g/dL   AST 24 15 - 41 U/L   ALT 27 0 - 44 U/L   Alkaline Phosphatase 111 38 - 126 U/L   Total Bilirubin 0.4 0.3 - 1.2 mg/dL   GFR calc non Af Amer >60 >60 mL/min   GFR calc Af Amer >60 >60 mL/min   Anion gap 9 5 - 15  Troponin I (High Sensitivity)  Result Value Ref Range   Troponin I (High Sensitivity) 7 <18 ng/L  Troponin I (High Sensitivity)  Result Value Ref Range   Troponin I (High Sensitivity) 6 <18 ng/L   Dg Chest 2 View  Result Date:  07/02/2019 CLINICAL DATA:  Chest pain EXAM: CHEST - 2 VIEW COMPARISON:  05/16/2019 FINDINGS: Lungs are hyperinflated consistent with COPD. Cardiac shadow is within normal limits. No focal infiltrate or sizable effusion is seen. Extrinsic leads are noted bilaterally. No bony abnormality is seen. IMPRESSION: COPD without acute abnormality. Electronically Signed   By: Inez Catalina M.D.   On: 07/02/2019 12:20   Ct Angio Neck W And/or Wo Contrast  Result Date: 07/02/2019 CLINICAL DATA:  Left arm tingling. The left upper extremity is cool to touch. Abnormal vascular ultrasound study. EXAM: CT ANGIOGRAPHY NECK TECHNIQUE: Multidetector CT imaging of the neck was performed using the standard protocol during bolus administration of intravenous contrast. Multiplanar CT image reconstructions and MIPs were obtained to evaluate the vascular anatomy. Carotid stenosis measurements (when applicable) are obtained utilizing NASCET criteria, using the distal internal carotid diameter as the denominator. CONTRAST:  173mL OMNIPAQUE IOHEXOL 350 MG/ML SOLN COMPARISON:  None. FINDINGS: Aortic arch: There is a common origin of the left common carotid artery in the innominate artery. Atherosclerotic calcifications  are present at the origin the left subclavian artery without a significant stenosis. Left subclavian artery is otherwise unremarkable. The visualized left axillary artery is normal as well. Right carotid system: Right common carotid artery is within normal limits. Minimal atherosclerotic changes are present in the proximal right ICA without a significant stenosis. The more distal cervical right ICA is normal. The visualized intracranial right ICA demonstrates some atherosclerotic change without a significant stenosis. Left carotid system: The left common carotid artery is within normal limits. Minimal atherosclerotic changes are present in the posterior left ICA without a significant stenosis. More distal cervical left ICA is  normal. Visualized intracranial ICA is normal. Vertebral arteries: Both vertebral arteries originate from the subclavian arteries. There is no significant stenosis proximally. There is no significant stenosis of either vertebral artery in the neck. The PICA origins are visualized and normal bilaterally. Basilar artery is unremarkable. Skeleton: There is straightening of the normal cervical lordosis. Grade 1 anterolisthesis present at C4-5. There is chronic loss of disc height and endplate sclerosis at X9-1 and C7-T1. Mild foraminal narrowing is present at both of these levels. No focal lytic or blastic lesions are present. Other neck: The visualized soft tissues the neck are otherwise within normal limits. Upper chest: Centrilobular emphysematous changes are present bilaterally. No focal nodule, mass, or airspace disease is present. The thoracic inlet is otherwise within normal limits. IMPRESSION: 1. Atherosclerotic changes at the origin of the left subclavian artery without a significant stenosis. No significant stenosis in the left subclavian artery or axillary artery to explain vascular symptoms in the left upper extremity. 2. No significant stenosis proximal to the left vertebral artery to suggest subclavian steal syndrome. 3. Mild atherosclerotic changes in the proximal internal carotid arteries bilaterally without a significant stenosis relative to the more distal vessels. 4. Vertebral arteries are within normal limits bilaterally. 5.  Emphysema (ICD10-J43.9). Electronically Signed   By: San Morelle M.D.   On: 07/02/2019 21:55   Vas [place] Abi With/wo Tbi  Result Date: 07/02/2019 LOWER EXTREMITY DOPPLER STUDY Indications: Rest pain, and left leg cooler than right.  Comparison Study: No prior study. Performing Technologist: Maudry Mayhew MHA, RVT, RDCS, RDMS  Examination Guidelines: A complete evaluation includes at minimum, Doppler waveform signals and systolic blood pressure reading at the level  of bilateral brachial, anterior tibial, and posterior tibial arteries, when vessel segments are accessible. Bilateral testing is considered an integral part of a complete examination. Photoelectric Plethysmograph (PPG) waveforms and toe systolic pressure readings are included as required and additional duplex testing as needed. Limited examinations for reoccurring indications may be performed as noted.  ABI Findings: +--------+------------------+-----+---------+--------+ Right   Rt Pressure (mmHg)IndexWaveform Comment  +--------+------------------+-----+---------+--------+ [id number]                    triphasic         +--------+------------------+-----+---------+--------+ PTA     168               1.10 triphasic         +--------+------------------+-----+---------+--------+ DP      169               1.10 triphasic         +--------+------------------+-----+---------+--------+ +--------+------------------+-----+----------+-------+ Left    Lt Pressure (mmHg)IndexWaveform  Comment +--------+------------------+-----+----------+-------+ [id number]                    triphasic         +--------+------------------+-----+----------+-------+ PTA  94                0.61 monophasic        +--------+------------------+-----+----------+-------+ DP      100               0.65 monophasic        +--------+------------------+-----+----------+-------+ +-------+-----------+-----------+------------+------------+ ABI/TBIToday's ABIToday's TBIPrevious ABIPrevious TBI +-------+-----------+-----------+------------+------------+ Right  1.10                                           +-------+-----------+-----------+------------+------------+ Left   0.65                                           +-------+-----------+-----------+------------+------------+  Summary: Right: Resting right ankle-brachial index is within normal range. No evidence of significant right lower  extremity arterial disease. Left: Resting left ankle-brachial index indicates moderate left lower extremity arterial disease.  *See table(s) above for measurements and observations.    Preliminary       Sherwood Gambler, MD 07/02/19 [phone]

## 2019-07-02 NOTE — ED Notes (Signed)
Patient transported to CT 

## 2019-07-02 NOTE — ED Notes (Signed)
Patient verbalizes understanding of discharge instructions. Opportunity for questioning and answers were provided. Armband removed by staff, pt discharged from ED in wheelchair.  
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
## 2019-07-02 NOTE — Discharge Instructions (Addendum)
Please take your increased dose of steroids for the next 4 days.  You may then return to your previously scheduled dosing regimen. The remainder of today studies have been reassuring, there is currently no evidence for damage to your heart, nor pneumonia, nor coronavirus, though this test is pending. Please be sure to follow-up with your physician and our vascular and vein specialists.

## 2019-07-02 NOTE — ED Triage Notes (Signed)
Pt's complaints are chronic.  States intermittent chest pain, L arm tingling, R leg tingling for several months.  Also, is worried he has a blockage d/t stomach swelling.

## 2019-07-02 NOTE — Progress Notes (Signed)
ABI completed. Refer to "CV Proc" under chart review to view preliminary results.  07/02/2019 5:38 PM Maudry Mayhew, MHA, RVT, RDCS, RDMS

## 2019-07-19 ENCOUNTER — Other Ambulatory Visit: Payer: Self-pay | Admitting: Internal Medicine

## 2019-07-26 ENCOUNTER — Other Ambulatory Visit: Payer: Self-pay | Admitting: Internal Medicine

## 2019-07-26 ENCOUNTER — Telehealth: Payer: Self-pay | Admitting: Internal Medicine

## 2019-07-26 MED ORDER — PREDNISONE 10 MG PO TABS: ORAL_TABLET | ORAL | 0 refills | Status: DC

## 2019-07-26 NOTE — Telephone Encounter (Signed)
Spoke with patient. He is aware of MW's recs. Advised him that I would go ahead and call in the RX for him. He verbalized understanding.   Nothing further needed at time of call.

## 2019-07-26 NOTE — Telephone Encounter (Signed)
LMTCB

## 2019-07-26 NOTE — Telephone Encounter (Signed)
Prednisone 20 mg until better then 10 mg daily is his maint rx for now   So give prednisone 10 mg #100 and make sure he has appoint ment within in next 2 weeks

## 2019-07-26 NOTE — Telephone Encounter (Signed)
PT RETURNING CALL.Stanley A Dalton °

## 2019-07-26 NOTE — Telephone Encounter (Signed)
Call returned to patient, requesting refill of Prednisone mg.   MW please advise if we can refill prednisone.Patient recently seen in ED for COPD exacerbation. Thanks.

## 2019-07-30 ENCOUNTER — Encounter: Payer: Medicaid Other | Admitting: Vascular Surgery

## 2019-07-30 ENCOUNTER — Telehealth: Payer: Self-pay | Admitting: Internal Medicine

## 2019-07-30 MED ORDER — TIOTROPIUM BROMIDE MONOHYDRATE 18 MCG IN CAPS: 18.0000 ug | ORAL_CAPSULE | Freq: Every day | RESPIRATORY_TRACT | 11 refills | Status: DC

## 2019-07-30 NOTE — Telephone Encounter (Signed)
Spoke with the pt  He states Spiriva needing PA  Called pharm  Called to initiate PA at [phone] pt ID number [id number]  Call reference number is [id number]   Answered the clinical questions and will await PA to be approved or denied PA number [phone]  I spoke with the pt and let him know that we are working on PA  He is concerned bc he is out of med  Wants something else called in  He can not come pick up sample due to not transportation  His insurance prefers the handihaler version of spiriva and he wants to know if he can just switch back to this so that he can get me today Please advise, thanks!

## 2019-07-30 NOTE — Telephone Encounter (Signed)
Fine to use spiriva handihaler for now - be sure to bring all active meds to office on f/u ov planned for 08/13/2019

## 2019-07-30 NOTE — Telephone Encounter (Signed)
Rx sent to pharm and pt notified of recs per MW  Pt verbalized understanding

## 2019-08-02 ENCOUNTER — Other Ambulatory Visit: Payer: Self-pay

## 2019-08-02 ENCOUNTER — Emergency Department (HOSPITAL_COMMUNITY): Payer: Medicaid Other

## 2019-08-02 ENCOUNTER — Encounter (HOSPITAL_COMMUNITY): Payer: Self-pay | Admitting: Emergency Medicine

## 2019-08-02 ENCOUNTER — Emergency Department (HOSPITAL_COMMUNITY)
Admission: EM | Admit: 2019-08-02 | Discharge: 2019-08-02 | Disposition: A | Discharge status: Home / Self Care | Payer: Medicaid Other | Attending: Emergency Medicine | Admitting: Emergency Medicine

## 2019-08-02 DIAGNOSIS — Z9981 Dependence on supplemental oxygen: Secondary | ICD-10-CM | POA: Insufficient documentation

## 2019-08-02 DIAGNOSIS — J449 Chronic obstructive pulmonary disease, unspecified: Secondary | ICD-10-CM | POA: Diagnosis not present

## 2019-08-02 DIAGNOSIS — S32040A Wedge compression fracture of fourth lumbar vertebra, initial encounter for closed fracture: Secondary | ICD-10-CM | POA: Diagnosis not present

## 2019-08-02 DIAGNOSIS — J9611 Chronic respiratory failure with hypoxia: Secondary | ICD-10-CM | POA: Insufficient documentation

## 2019-08-02 DIAGNOSIS — I10 Essential (primary) hypertension: Secondary | ICD-10-CM | POA: Insufficient documentation

## 2019-08-02 DIAGNOSIS — Y9389 Activity, other specified: Secondary | ICD-10-CM | POA: Insufficient documentation

## 2019-08-02 DIAGNOSIS — M4856XA Collapsed vertebra, not elsewhere classified, lumbar region, initial encounter for fracture: Secondary | ICD-10-CM

## 2019-08-02 DIAGNOSIS — X500XXA Overexertion from strenuous movement or load, initial encounter: Secondary | ICD-10-CM | POA: Insufficient documentation

## 2019-08-02 DIAGNOSIS — F17210 Nicotine dependence, cigarettes, uncomplicated: Secondary | ICD-10-CM | POA: Insufficient documentation

## 2019-08-02 DIAGNOSIS — Y999 Unspecified external cause status: Secondary | ICD-10-CM | POA: Diagnosis not present

## 2019-08-02 DIAGNOSIS — M549 Dorsalgia, unspecified: Secondary | ICD-10-CM | POA: Diagnosis present

## 2019-08-02 DIAGNOSIS — Y929 Unspecified place or not applicable: Secondary | ICD-10-CM | POA: Diagnosis not present

## 2019-08-02 DIAGNOSIS — Z79899 Other long term (current) drug therapy: Secondary | ICD-10-CM | POA: Diagnosis not present

## 2019-08-02 IMAGING — DX DG LUMBAR SPINE COMPLETE 4+V
5 series · 5 of 5 positions shown · non-contrast
Comparison: [DATE]

CLINICAL DATA: Low back and coccyx pain.

EXAM:
LUMBAR SPINE - COMPLETE 4+ VIEW

[l-spine ap]
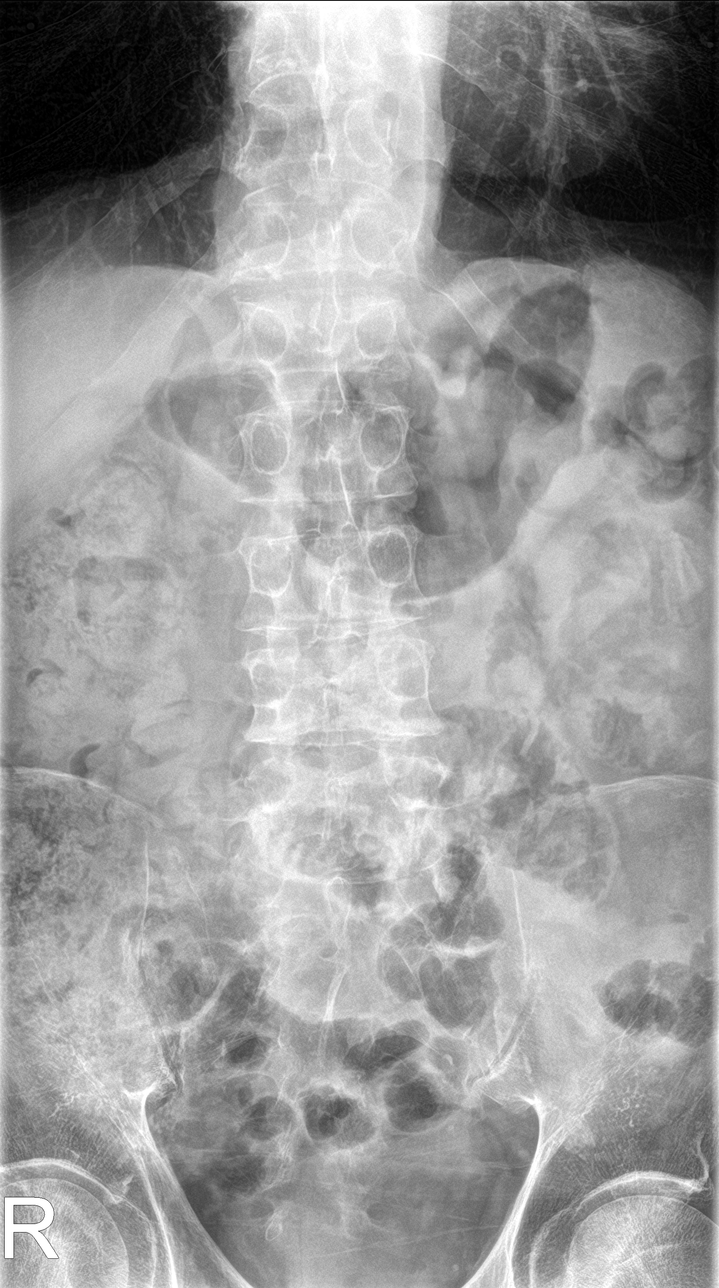

[l-spine obl (1 of 2)]
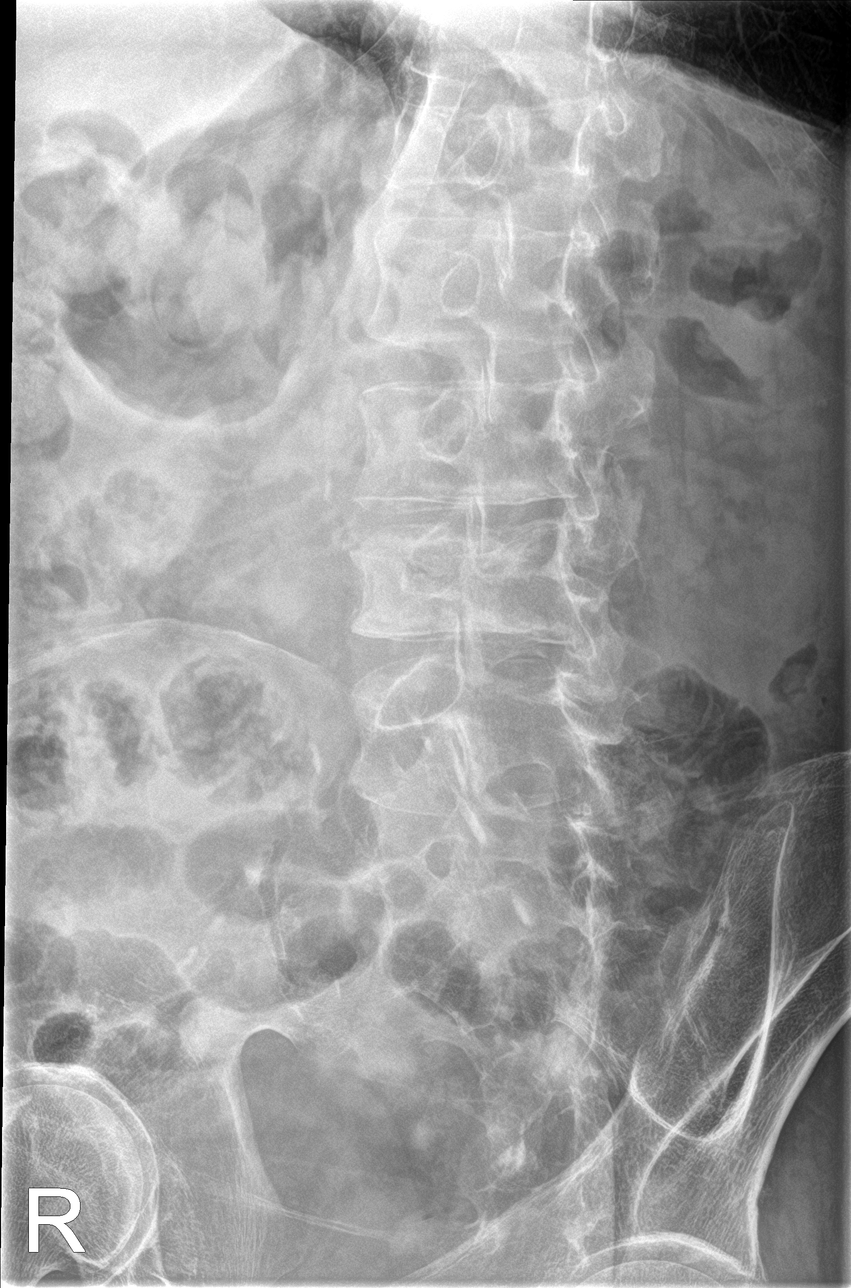

[l-spine obl (2 of 2)]
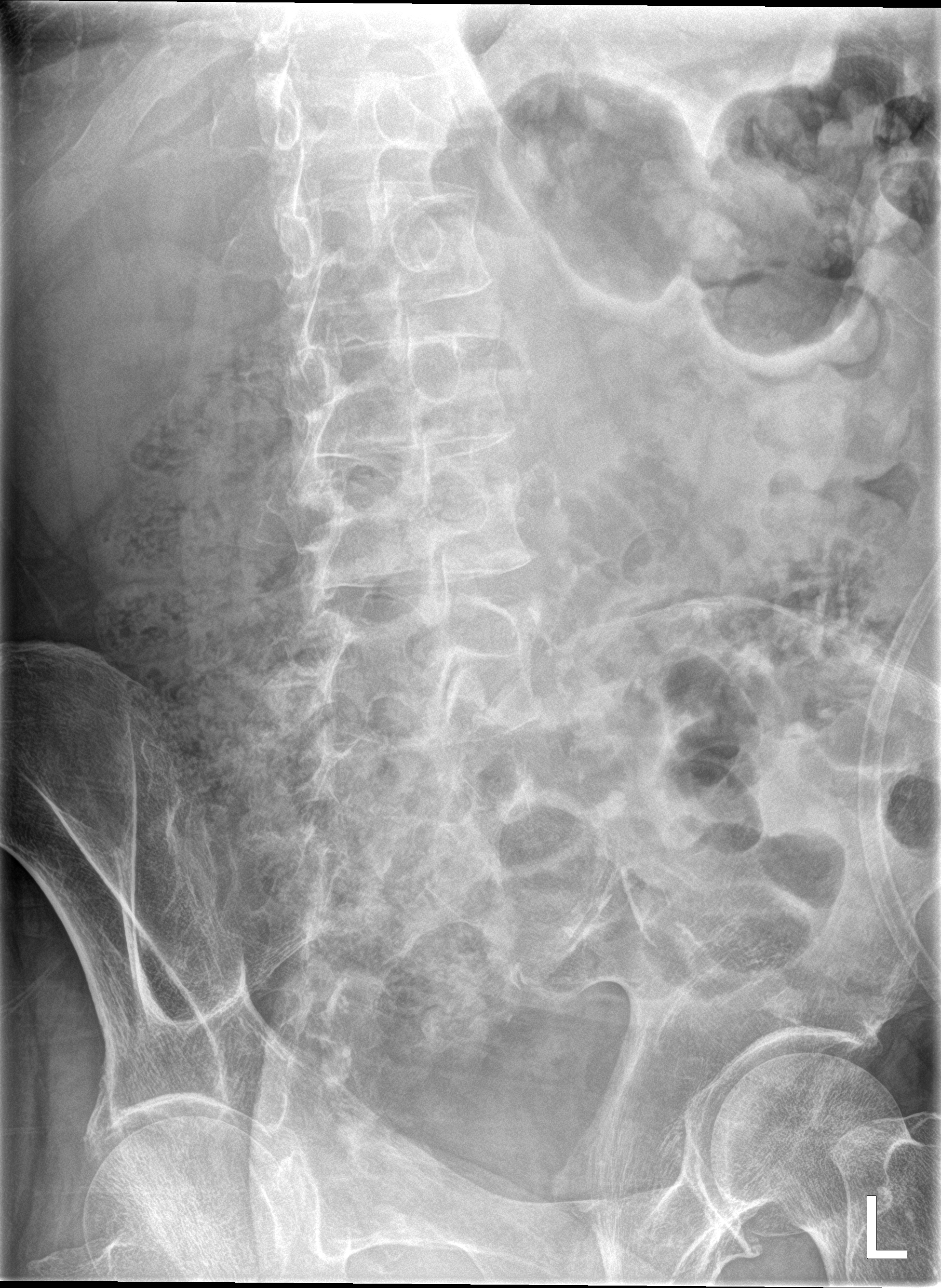

[l-spine lat]
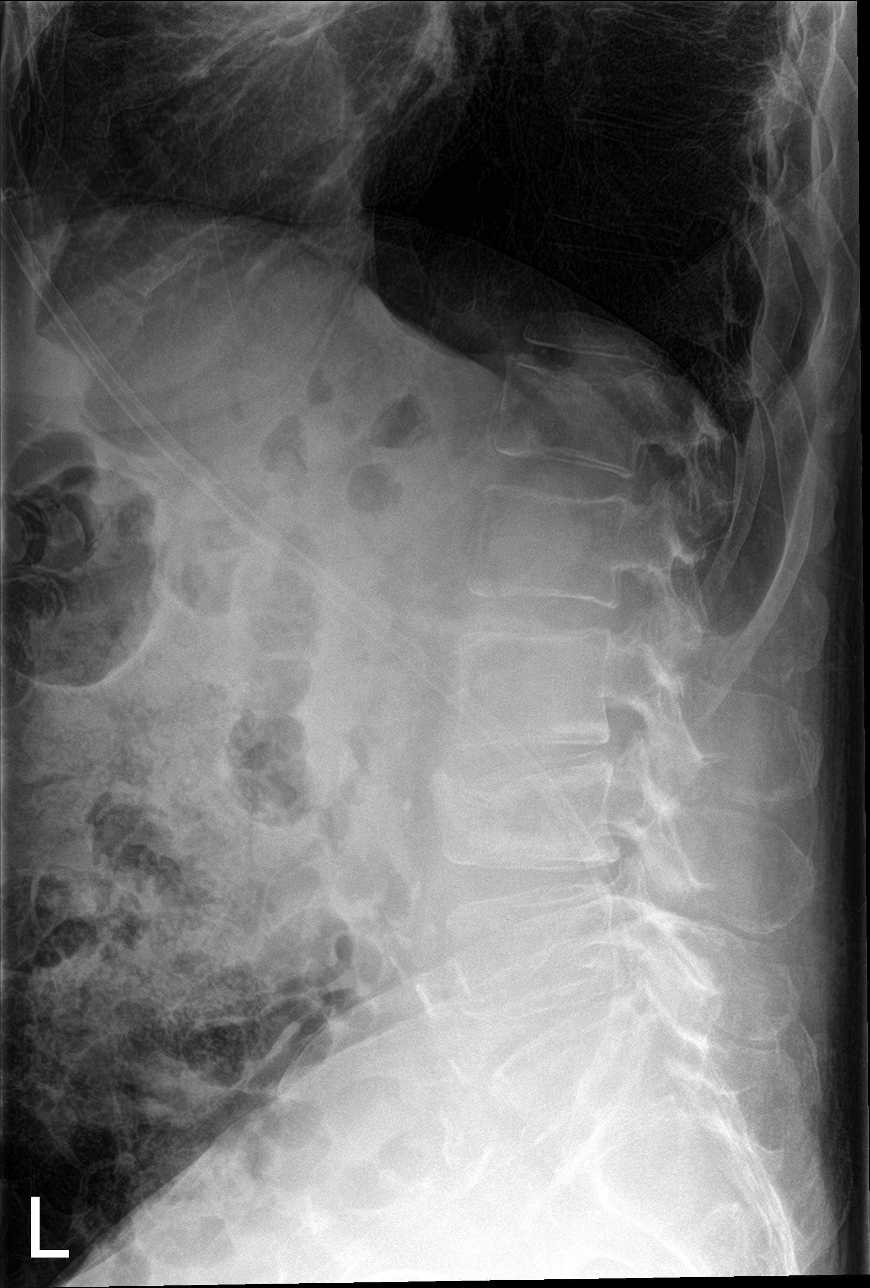

[l-spine spot]
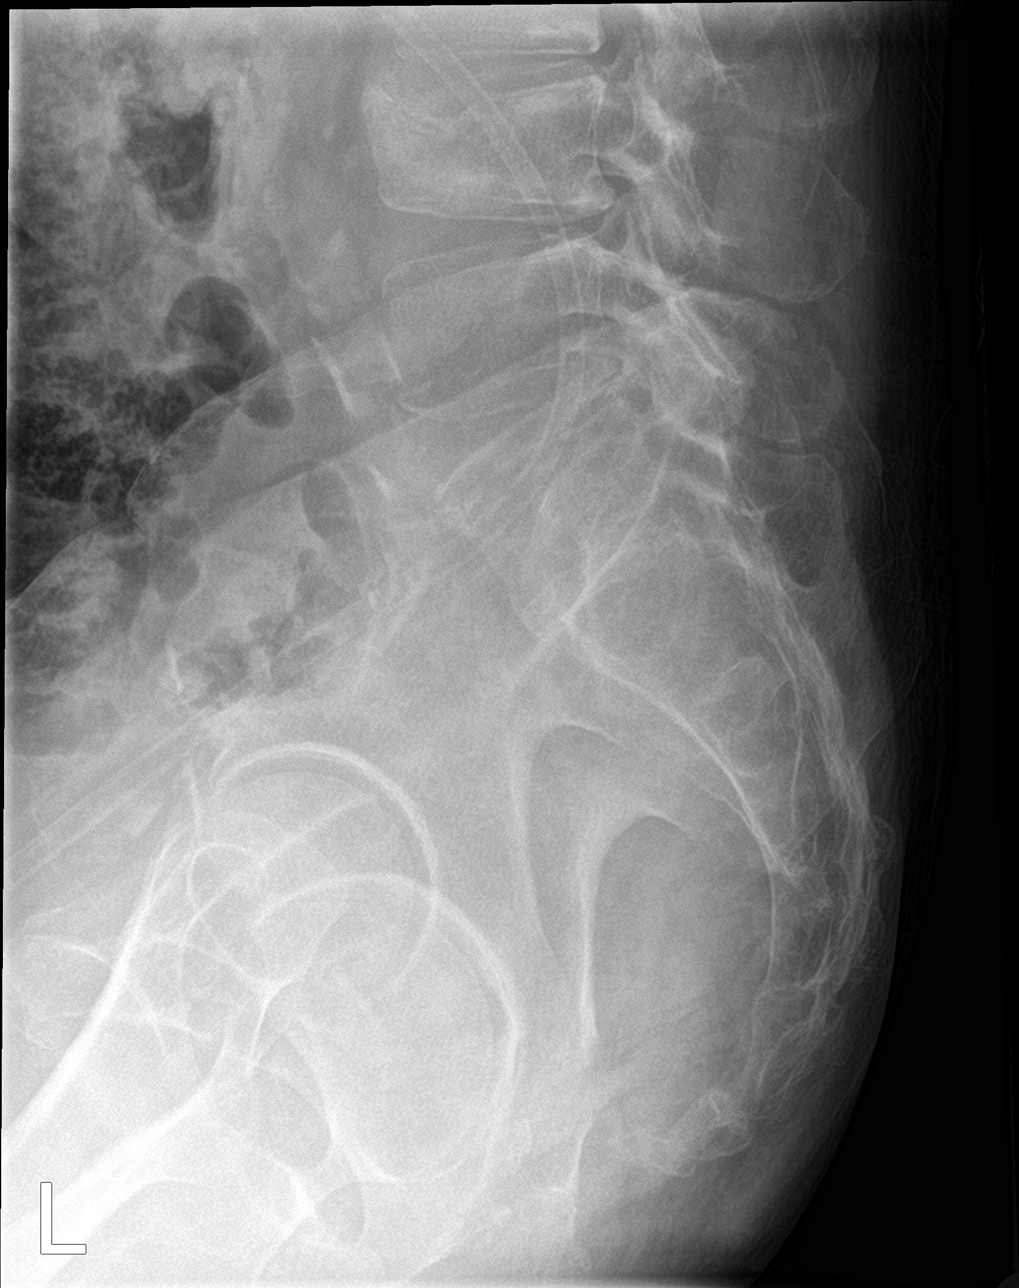

[5 of 5 positions shown; findings below may reference images not displayed]

FINDINGS: There is increased anterior wedging of L4 with concavity of the
anterior superior endplate compared to the previous study. Mild
concavity of the superior L1 endplate, not seen previously. No other
acute abnormalities.
IMPRESSION: 1. Anterior wedging of L4 has progressed since [DATE]
suggesting an acute to subacute compression fracture.
2. Mild concavity of the superior endplate of L1 was not seen
previously and could represent a subtle compression fracture as
well.

## 2019-08-02 MED ORDER — OXYCODONE-ACETAMINOPHEN 5-325 MG PO TABS: 1.0000 | ORAL_TABLET | Freq: Four times a day (QID) | ORAL | 0 refills | Status: DC | PRN

## 2019-08-02 MED ORDER — OXYCODONE-ACETAMINOPHEN 5-325 MG PO TABS
1.0000 | ORAL_TABLET | Freq: Once | ORAL | Status: AC
  Administered 2019-08-02: 1 via ORAL
  Filled 2019-08-02: qty 1

## 2019-08-02 NOTE — ED Triage Notes (Signed)
Pt  Here with co chronic lower Back pain sharp and left arm  Numbness x1 week.

## 2019-08-02 NOTE — ED Notes (Signed)
Pt given dc instructions pt verbalizes understanding.  

## 2019-08-02 NOTE — Discharge Instructions (Addendum)
Please read attached information. If you experience any new or worsening signs or symptoms please return to the emergency room for evaluation. Please follow-up with your primary care provider or specialist as discussed. Please use medication prescribed only as directed and discontinue taking if you have any concerning signs or symptoms.   °

## 2019-08-02 NOTE — Discharge Planning (Signed)
EDCM to follow for disposition needs.
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
## 2019-08-06 ENCOUNTER — Emergency Department (HOSPITAL_COMMUNITY): Payer: Medicaid Other

## 2019-08-06 ENCOUNTER — Encounter (HOSPITAL_COMMUNITY): Payer: Self-pay | Admitting: Emergency Medicine

## 2019-08-06 ENCOUNTER — Emergency Department (HOSPITAL_COMMUNITY)
Admission: EM | Admit: 2019-08-06 | Discharge: 2019-08-06 | Disposition: A | Discharge status: Home / Self Care | Payer: Medicaid Other | Attending: Emergency Medicine | Admitting: Emergency Medicine

## 2019-08-06 ENCOUNTER — Other Ambulatory Visit: Payer: Self-pay

## 2019-08-06 DIAGNOSIS — R109 Unspecified abdominal pain: Secondary | ICD-10-CM | POA: Diagnosis not present

## 2019-08-06 DIAGNOSIS — J449 Chronic obstructive pulmonary disease, unspecified: Secondary | ICD-10-CM | POA: Diagnosis not present

## 2019-08-06 DIAGNOSIS — F17210 Nicotine dependence, cigarettes, uncomplicated: Secondary | ICD-10-CM | POA: Insufficient documentation

## 2019-08-06 DIAGNOSIS — I10 Essential (primary) hypertension: Secondary | ICD-10-CM | POA: Insufficient documentation

## 2019-08-06 DIAGNOSIS — Z79899 Other long term (current) drug therapy: Secondary | ICD-10-CM | POA: Insufficient documentation

## 2019-08-06 DIAGNOSIS — R079 Chest pain, unspecified: Secondary | ICD-10-CM | POA: Diagnosis not present

## 2019-08-06 DIAGNOSIS — F1210 Cannabis abuse, uncomplicated: Secondary | ICD-10-CM | POA: Insufficient documentation

## 2019-08-06 LAB — BASIC METABOLIC PANEL
Anion gap: 10 (ref 5–15)
BUN: 13 mg/dL (ref 6–20)
CO2: 26 mmol/L (ref 22–32)
Calcium: 8.9 mg/dL (ref 8.9–10.3)
Chloride: 104 mmol/L (ref 98–111)
Creatinine, Ser: 0.82 mg/dL (ref 0.61–1.24)
GFR calc Af Amer: >60 mL/min (ref >60)
GFR calc non Af Amer: >60 mL/min (ref >60)
Glucose, Bld: 82 mg/dL (ref 70–99)
Potassium: 3.3 mmol/L — ABNORMAL LOW (ref 3.5–5.1)
Sodium: 140 mmol/L (ref 135–145)

## 2019-08-06 LAB — CBC
HCT: 45.4 % (ref 39.0–52.0)
Hemoglobin: 14.8 g/dL (ref 13.0–17.0)
MCH: 33 pg (ref 26.0–34.0)
MCHC: 32.6 g/dL (ref 30.0–36.0)
MCV: 101.1 fL — ABNORMAL HIGH (ref 80.0–100.0)
Platelets: 243 10*3/uL (ref 150–400)
RBC: 4.49 MIL/uL (ref 4.22–5.81)
RDW: 14.9 % (ref 11.5–15.5)
WBC: 13.4 10*3/uL — ABNORMAL HIGH (ref 4.0–10.5)
nRBC: 0 % (ref 0.0–0.2)

## 2019-08-06 LAB — URINALYSIS, ROUTINE W REFLEX MICROSCOPIC
Bilirubin Urine: NEGATIVE
Glucose, UA: NEGATIVE mg/dL
Hgb urine dipstick: NEGATIVE
Ketones, ur: NEGATIVE mg/dL
Leukocytes,Ua: NEGATIVE
Nitrite: NEGATIVE
Protein, ur: NEGATIVE mg/dL
Specific Gravity, Urine: 1.045 — ABNORMAL HIGH (ref 1.005–1.030)
pH: 6 (ref 5.0–8.0)

## 2019-08-06 LAB — TROPONIN I (HIGH SENSITIVITY)
Troponin I (High Sensitivity): 7 ng/L (ref <18)
Troponin I (High Sensitivity): 9 ng/L (ref <18)

## 2019-08-06 IMAGING — CT CT ABD-PELV W/ CM
2 of 5 series · 15 of 46 positions shown, 17 images · IV contrast (Omni 300)
Comparison: CT dated [DATE].

CLINICAL DATA: Abdominal pain with diverticulitis suspected. Left
flank pain that began this morning.

EXAM:
CT ABDOMEN AND PELVIS WITH CONTRAST
TECHNIQUE: Multidetector CT imaging of the abdomen and pelvis was performed
using the standard protocol following bolus administration of
intravenous contrast.
CONTRAST:  100mL OMNIPAQUE IOHEXOL 300 MG/ML  SOLN

[Series 3: a/p w/ 5mm · axial · 0.89mm/px · z∈[-930,-530]mm · 12 of 90 slices shown, 14 images]
[im 5/90  soft-tissue]
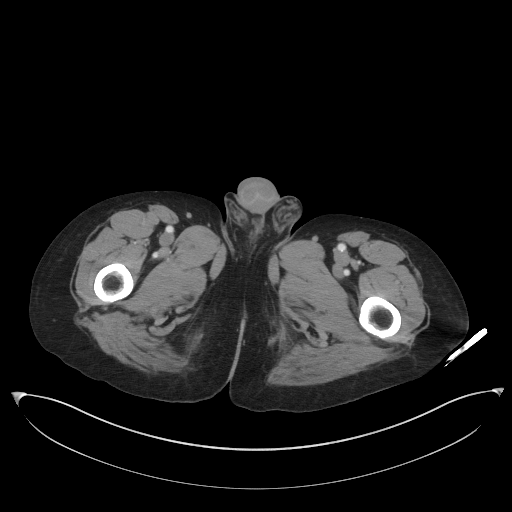
[im 5/90  bone]
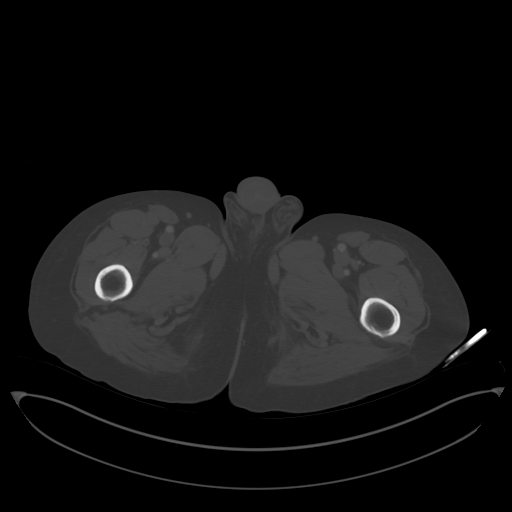
[im 14/90  soft-tissue]
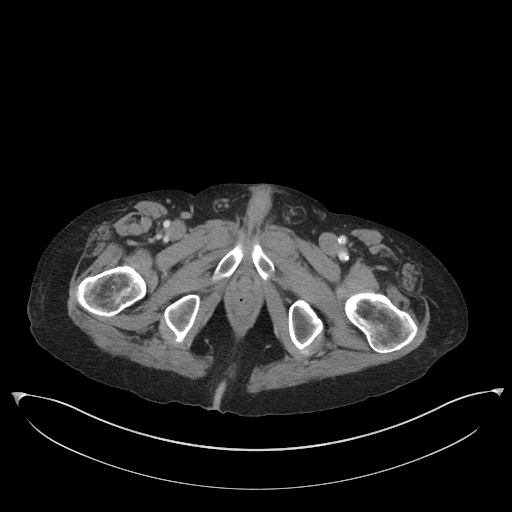
[im 18/90  soft-tissue]
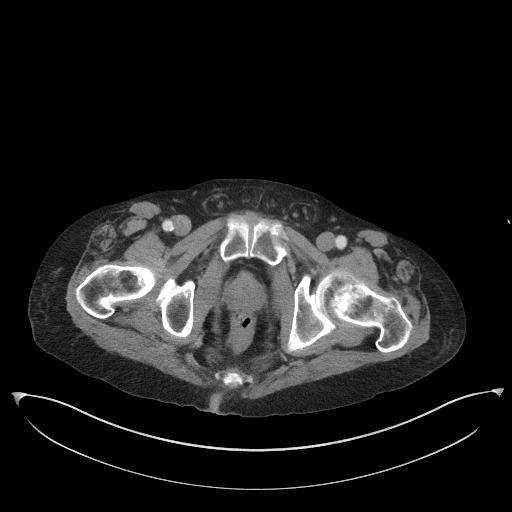
[im 27/90  soft-tissue]
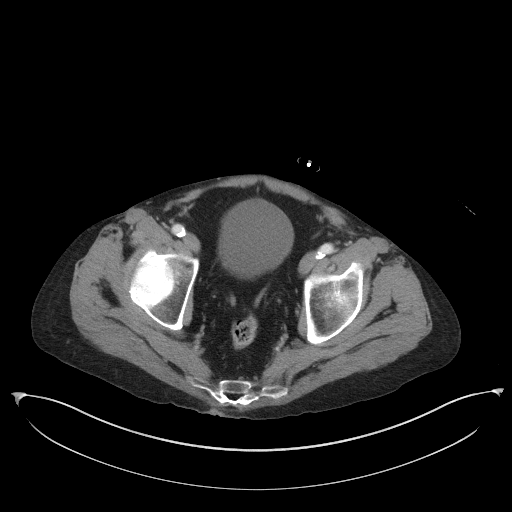
[im 36/90  soft-tissue]
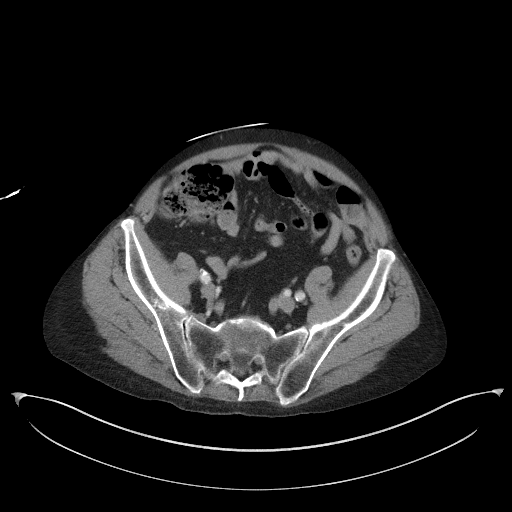
[im 41/90  soft-tissue]
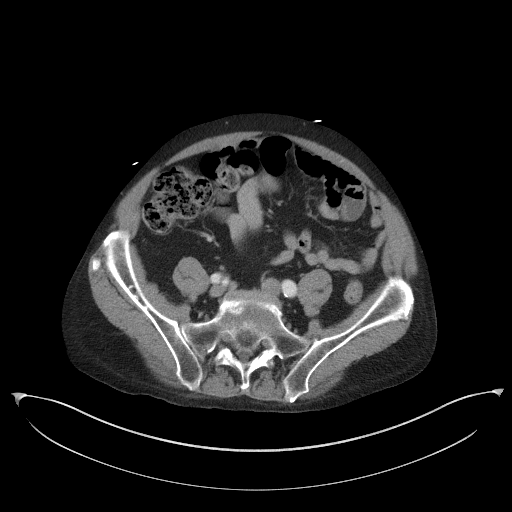
[im 49/90  soft-tissue]
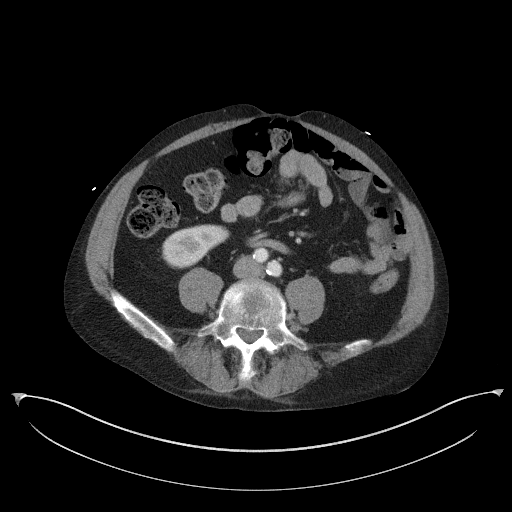
[im 54/90  soft-tissue]
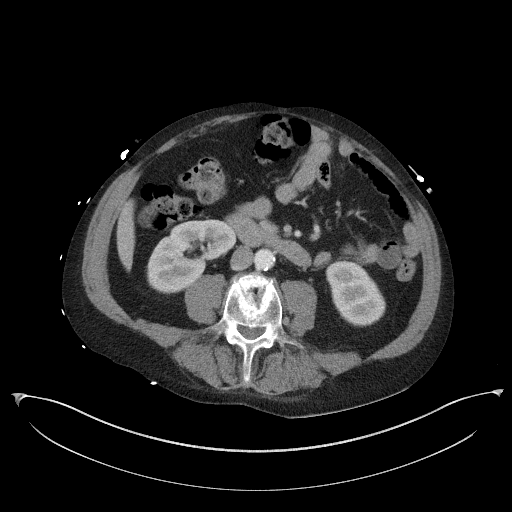
[im 63/90  soft-tissue]
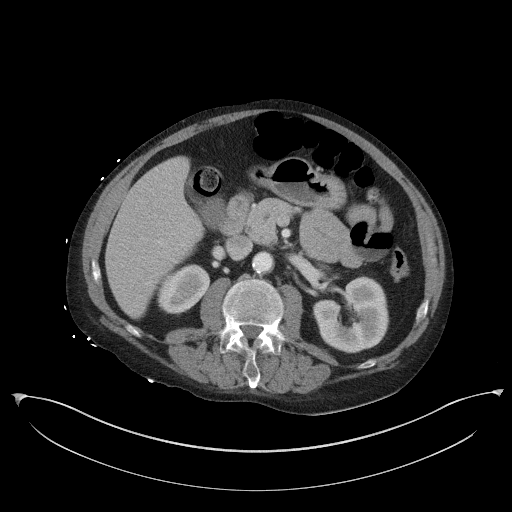
[im 63/90  bone]
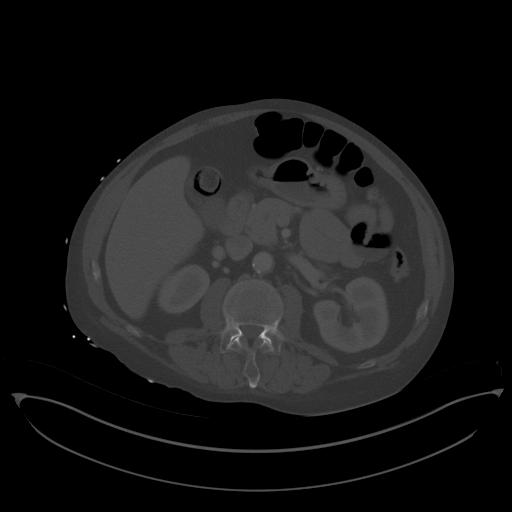
[im 72/90  soft-tissue]
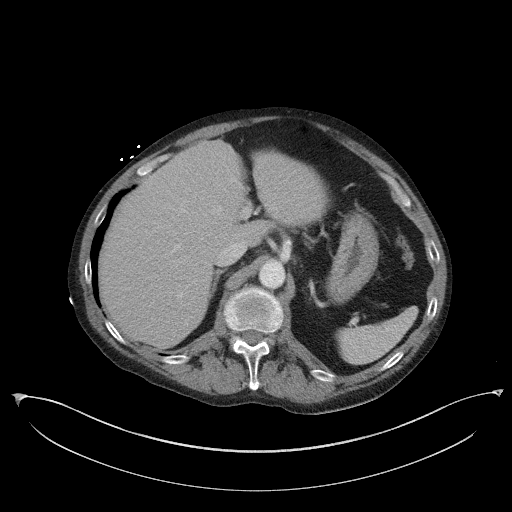
[im 76/90  soft-tissue]
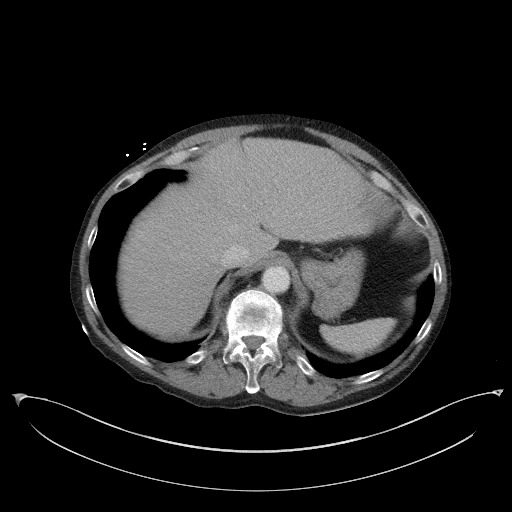
[im 85/90  soft-tissue]
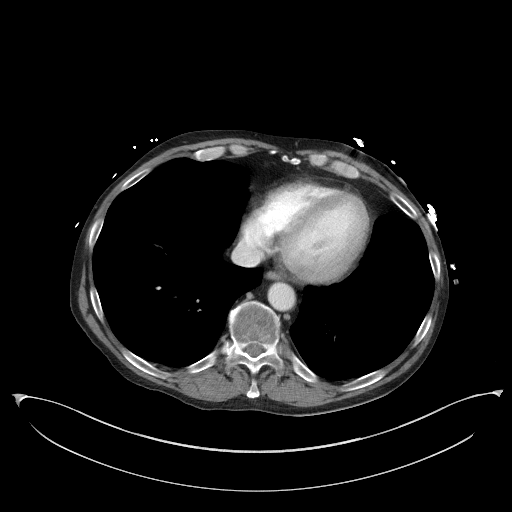

[Series 6: a/p w/ cor · coronal · 0.74mm/px · 3 of 147 slices shown]
[im 49/147  soft-tissue]
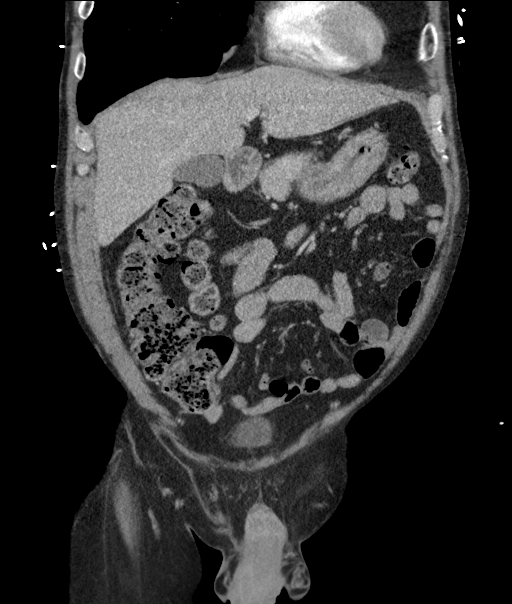
[im 65/147  soft-tissue]
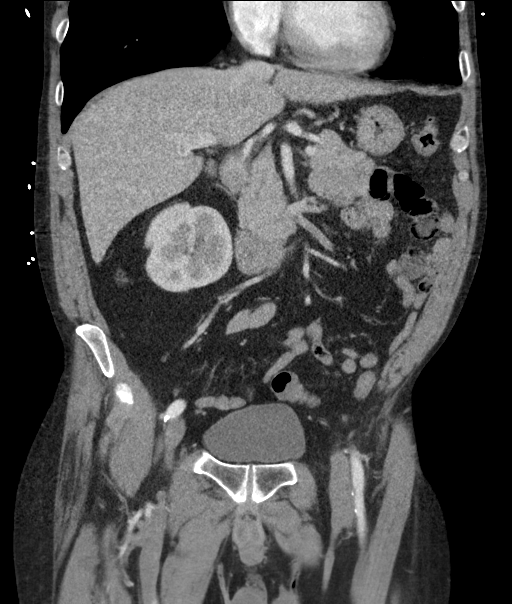
[im 82/147  soft-tissue]
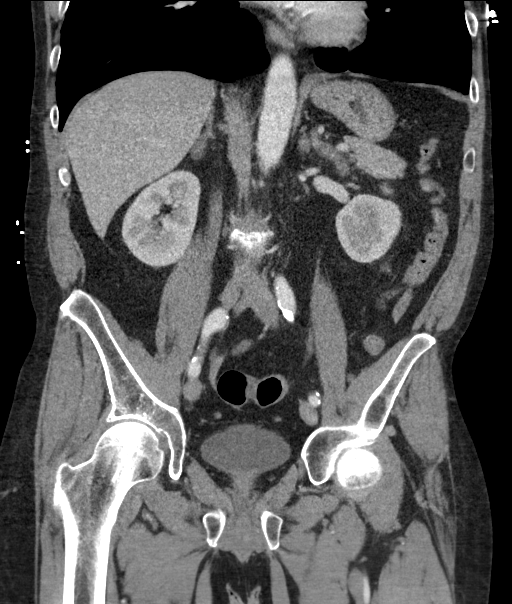

[15 of 46 positions shown; findings below may reference images not displayed]

FINDINGS: Lower chest: There are emphysematous changes at the lung bases
bilaterally.The heart size is normal.

Hepatobiliary: The liver is normal. Normal gallbladder.There is no
biliary ductal dilation.

Pancreas: The pancreatic duct is mildly prominent without evidence
for distinct pancreatic mass.

Spleen: No splenic laceration or hematoma.

Adrenals/Urinary Tract:

--Adrenal glands: There is stable nodularity of both adrenal glands.

--Right kidney/ureter: No hydronephrosis or perinephric hematoma.

--Left kidney/ureter: No hydronephrosis or perinephric hematoma.

--Urinary bladder: Unremarkable.

Stomach/Bowel:

--Stomach/Duodenum: No hiatal hernia or other gastric abnormality.
Normal duodenal course and caliber.

--Small bowel: No dilatation or inflammation.

--Colon: No focal abnormality.

--Appendix: Not visualized. No right lower quadrant inflammation or
free fluid.

Vascular/Lymphatic: Atherosclerotic calcification is present within
the non-aneurysmal abdominal aorta, without hemodynamically
significant stenosis.

--No retroperitoneal lymphadenopathy.

--No mesenteric lymphadenopathy.

--No pelvic or inguinal lymphadenopathy.

Reproductive: Unremarkable

Other: No ascites or free air. The abdominal wall is normal.

Musculoskeletal. There is an acute appearing compression fracture of
the L1 vertebral body resulting in approximately 40% height loss
within the mid vertebral body. There is a compression versus burst
fracture of the L4 vertebral body with mild retropulsion of the
inferior endplate. On the axial view, there appears to be a fracture
plane extending into the posterior wall of the vertebral body. There
is some sclerosis within the L4 vertebral body. Gas is noted within
the L3-L4 and L4-L5 disc spaces.
IMPRESSION: 1. No CT evidence for diverticulitis.
2. Acute to subacute burst fracture of the L4 vertebral body with
minimal retropulsion at the inferior endplate. Sclerosis within the
vertebral bodies favored to be secondary to the underlying fracture,
however if there is a history of malignancy follow-up with a
nonemergent contrast enhanced MRI is recommended to help exclude a
pathologic fracture.
3. Acute to subacute mild compression fracture of the L1 vertebral
body.

Aortic Atherosclerosis ([A4]-[A4]) and Emphysema ([A4]-[A4]).

## 2019-08-06 IMAGING — DX DG CHEST 1V PORT
2 series · 2 of 2 positions shown · non-contrast
Comparison: [DATE]

CLINICAL DATA: Left chest pain. Shortness of breath.

EXAM:
PORTABLE CHEST 1 VIEW

[chest ap (1 of 2)]
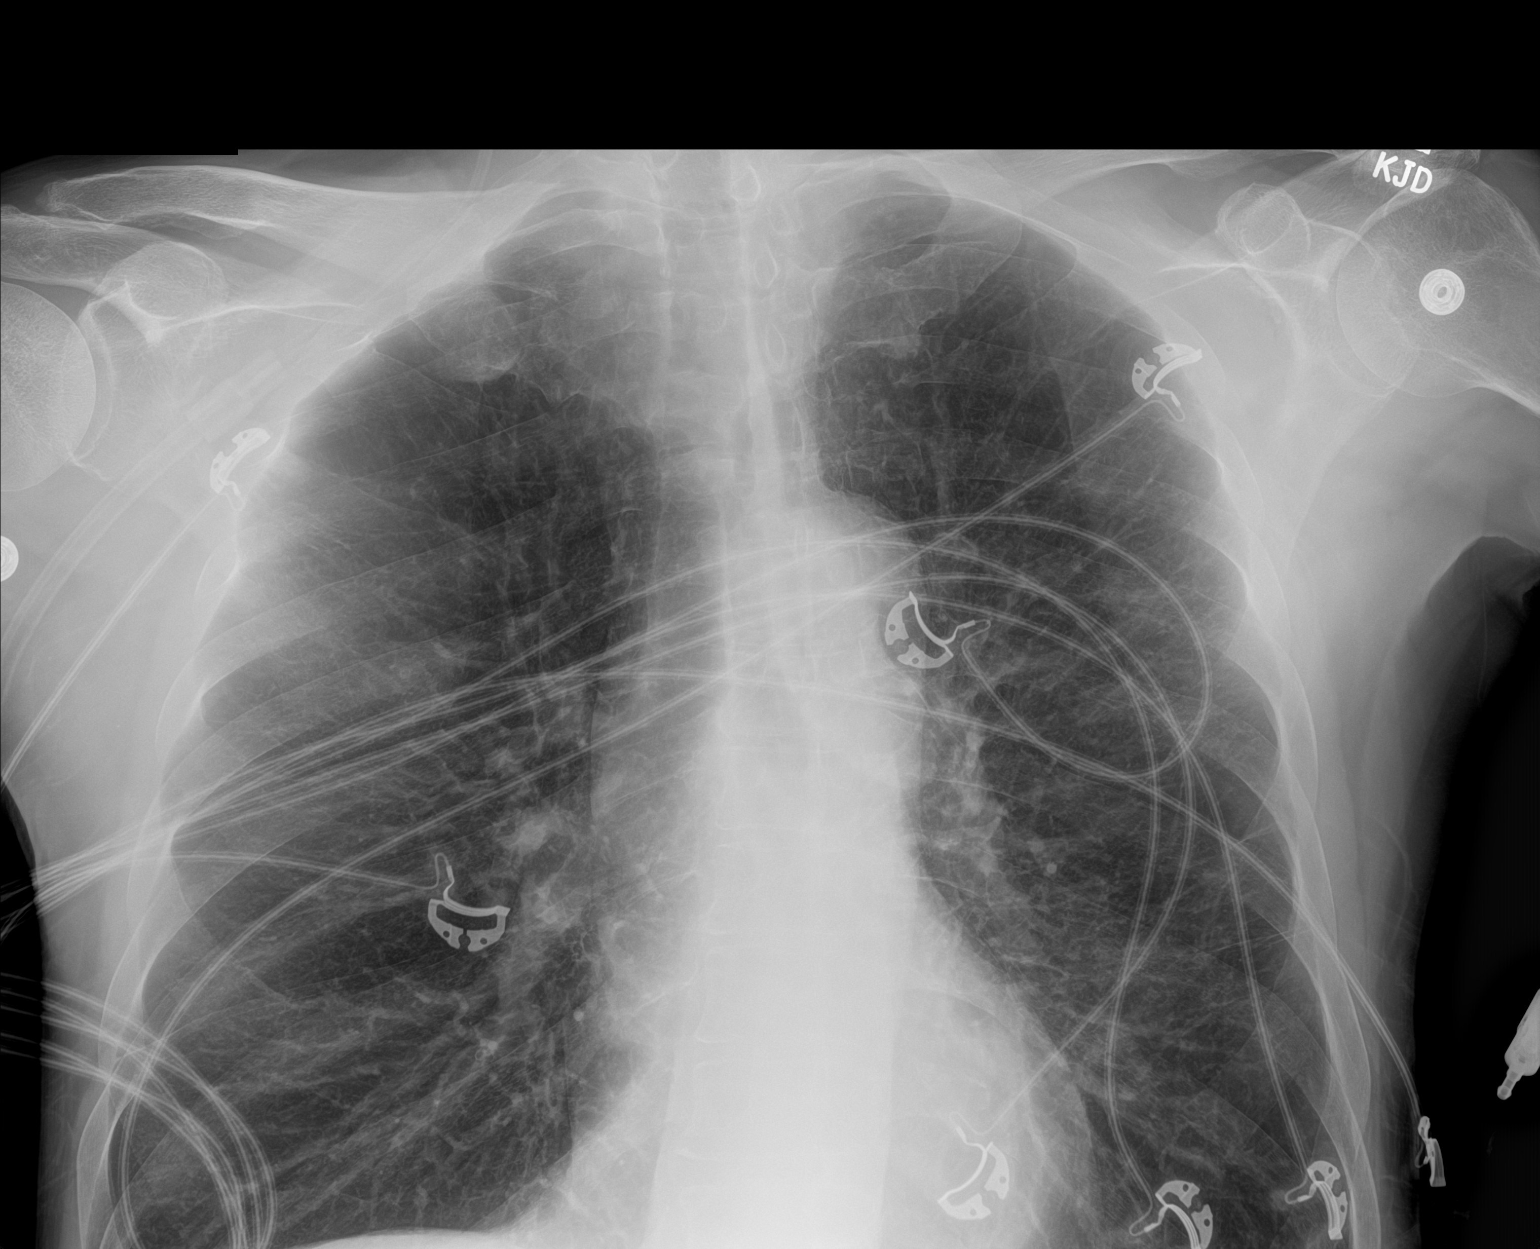

[chest ap (2 of 2)]
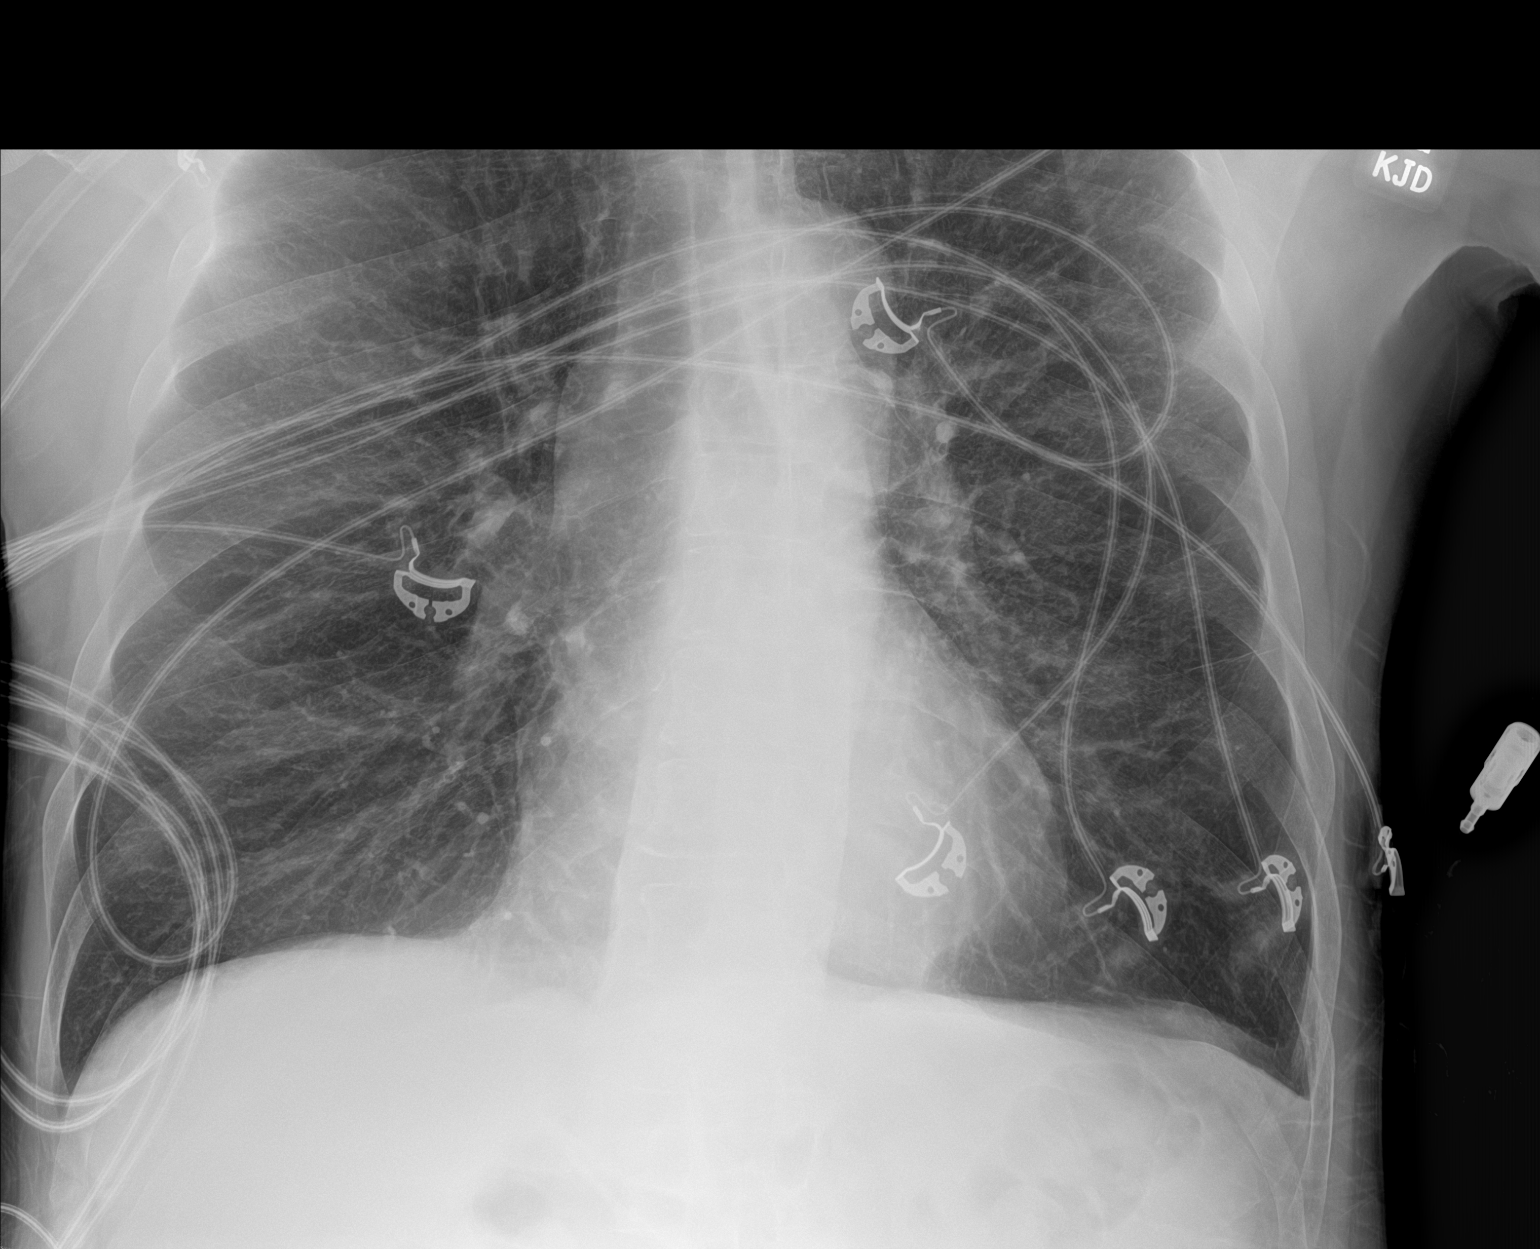

[2 of 2 positions shown; findings below may reference images not displayed]

FINDINGS: 2 frontal radiographs. Numerous leads and wires project over the
chest. Midline trachea. Normal heart size. No pleural effusion or
pneumothorax. Diffuse peribronchial thickening. No lobar
consolidation. Limited evaluation of the left lung base secondary to
overlying EKG lead artifacts.
IMPRESSION: 1. No acute cardiopulmonary disease.
2. Hyperinflation and interstitial thickening, most consistent with
COPD/chronic bronchitis.

## 2019-08-06 MED ORDER — IOHEXOL 300 MG/ML  SOLN
100.0000 mL | Freq: Once | INTRAMUSCULAR | Status: AC | PRN
  Administered 2019-08-06: 100 mL via INTRAVENOUS

## 2019-08-06 MED ORDER — MOMETASONE FURO-FORMOTEROL FUM 200-5 MCG/ACT IN AERO
2.0000 | INHALATION_SPRAY | Freq: Once | RESPIRATORY_TRACT | Status: AC
  Administered 2019-08-06: 2 via RESPIRATORY_TRACT
  Filled 2019-08-06: qty 8.8

## 2019-08-06 MED ORDER — HYDROMORPHONE HCL 1 MG/ML IJ SOLN
1.0000 mg | Freq: Once | INTRAMUSCULAR | Status: AC
  Administered 2019-08-06: 1 mg via INTRAVENOUS
  Filled 2019-08-06: qty 1

## 2019-08-06 MED ORDER — POTASSIUM CHLORIDE CRYS ER 20 MEQ PO TBCR
40.0000 meq | EXTENDED_RELEASE_TABLET | Freq: Once | ORAL | Status: AC
  Administered 2019-08-06: 40 meq via ORAL
  Filled 2019-08-06: qty 2

## 2019-08-06 MED ORDER — ALBUTEROL SULFATE HFA 108 (90 BASE) MCG/ACT IN AERS
2.0000 | INHALATION_SPRAY | Freq: Once | RESPIRATORY_TRACT | Status: AC
  Administered 2019-08-06: 2 via RESPIRATORY_TRACT
  Filled 2019-08-06: qty 6.7

## 2019-08-06 MED ORDER — SODIUM CHLORIDE 0.9 % IV BOLUS
500.0000 mL | Freq: Once | INTRAVENOUS | Status: AC
  Administered 2019-08-06: 500 mL via INTRAVENOUS

## 2019-08-06 NOTE — ED Notes (Signed)
Attempted to draw trop and pt is not in room.
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
## 2019-08-06 NOTE — ED Triage Notes (Signed)
Pt reports L flank pain that began this am as well as feeling like his abdomen is swollen, denies any other urinary symptoms. Also reports some numbness to L hand (only-no neuro deficits), states he has been diagnosed with a pinched nerve in L arm

## 2019-08-06 NOTE — Discharge Instructions (Signed)
If you develop worsening, continued, or recurrent abdominal pain, uncontrolled vomiting, fever, chest or back pain, or any other new/concerning symptoms then return to the ER for evaluation.  

## 2019-08-10 ENCOUNTER — Ambulatory Visit: Payer: Medicaid Other | Admitting: Internal Medicine

## 2019-08-12 ENCOUNTER — Other Ambulatory Visit: Payer: Self-pay | Admitting: Primary Care

## 2019-08-13 ENCOUNTER — Telehealth: Payer: Self-pay | Admitting: Internal Medicine

## 2019-08-13 ENCOUNTER — Ambulatory Visit: Payer: Medicaid Other | Admitting: Internal Medicine

## 2019-08-13 MED ORDER — PREDNISONE 10 MG PO TABS: ORAL_TABLET | ORAL | 0 refills | Status: DC

## 2019-08-13 MED ORDER — PANTOPRAZOLE SODIUM 40 MG PO TBEC: 40.0000 mg | DELAYED_RELEASE_TABLET | Freq: Every day | ORAL | 2 refills | Status: DC

## 2019-08-13 MED ORDER — ALBUTEROL SULFATE HFA 108 (90 BASE) MCG/ACT IN AERS: 1.0000 | INHALATION_SPRAY | RESPIRATORY_TRACT | 2 refills | Status: DC | PRN

## 2019-08-13 NOTE — Telephone Encounter (Signed)
Spoke with pt. He is needing refills on Proair, Pantoprazole and Prednisone. All prescriptions have been sent in. Nothing further was needed at this time.

## 2019-08-14 ENCOUNTER — Telehealth: Payer: Self-pay | Admitting: Internal Medicine

## 2019-08-14 NOTE — Telephone Encounter (Signed)
Patient contacted me last night around 10:30 PM He reported that he had missed his appointment recently with [name] due to transportation issues but is overall health having been in decline.  He is having issues with constipation and incomplete evacuation.  In so many words he communicated that he feels like his rectum and sphincter muscles are weak not allowing him to completely evacuate.  He has also been having lower back pain and may be facing neurosurgery.  He has been seeing a neurosurgeon recently.  He feels bloating and this affects his appetite and also his breathing.  He communicated that his COPD is advanced and he is using oxygen all the time and has limited mobility.  He has Ex-Lax at home but no other laxatives.  He does have stool softeners.  Plan:  I advised that he take 1-2 Ex-Lax daily to try to produce a consistent bowel movement.  If this is ineffective or causes diarrhea he should switch to MiraLAX 17 g daily.  He was also instructed to continue follow-up with neurosurgery given that lumbar and sacral disc can lead to nerve impingement and affect bladder and rectal emptying.  He is also requesting to be contacted by our office to reschedule his appointment with Dr.  Henrene Pastor  I will communicate this to [name] and Vaughan Basta, his nurse

## 2019-08-16 ENCOUNTER — Other Ambulatory Visit (HOSPITAL_COMMUNITY): Payer: Self-pay | Admitting: Neurosurgery

## 2019-08-16 ENCOUNTER — Other Ambulatory Visit: Payer: Self-pay | Admitting: Neurosurgery

## 2019-08-16 DIAGNOSIS — S32001A Stable burst fracture of unspecified lumbar vertebra, initial encounter for closed fracture: Secondary | ICD-10-CM

## 2019-08-16 DIAGNOSIS — G959 Disease of spinal cord, unspecified: Secondary | ICD-10-CM

## 2019-08-23 ENCOUNTER — Emergency Department (HOSPITAL_COMMUNITY): Payer: Medicaid Other

## 2019-08-23 ENCOUNTER — Encounter (HOSPITAL_COMMUNITY): Payer: Self-pay

## 2019-08-23 ENCOUNTER — Emergency Department (HOSPITAL_COMMUNITY)
Admission: EM | Admit: 2019-08-23 | Discharge: 2019-08-23 | Disposition: A | Discharge status: Home / Self Care | Payer: Medicaid Other | Attending: Emergency Medicine | Admitting: Emergency Medicine

## 2019-08-23 ENCOUNTER — Other Ambulatory Visit: Payer: Self-pay

## 2019-08-23 DIAGNOSIS — Y999 Unspecified external cause status: Secondary | ICD-10-CM | POA: Diagnosis not present

## 2019-08-23 DIAGNOSIS — Y939 Activity, unspecified: Secondary | ICD-10-CM | POA: Insufficient documentation

## 2019-08-23 DIAGNOSIS — Y929 Unspecified place or not applicable: Secondary | ICD-10-CM | POA: Insufficient documentation

## 2019-08-23 DIAGNOSIS — S32000S Wedge compression fracture of unspecified lumbar vertebra, sequela: Secondary | ICD-10-CM

## 2019-08-23 DIAGNOSIS — I10 Essential (primary) hypertension: Secondary | ICD-10-CM | POA: Diagnosis not present

## 2019-08-23 DIAGNOSIS — F17210 Nicotine dependence, cigarettes, uncomplicated: Secondary | ICD-10-CM | POA: Diagnosis not present

## 2019-08-23 DIAGNOSIS — J449 Chronic obstructive pulmonary disease, unspecified: Secondary | ICD-10-CM | POA: Diagnosis not present

## 2019-08-23 DIAGNOSIS — X58XXXA Exposure to other specified factors, initial encounter: Secondary | ICD-10-CM | POA: Insufficient documentation

## 2019-08-23 DIAGNOSIS — Z79899 Other long term (current) drug therapy: Secondary | ICD-10-CM | POA: Insufficient documentation

## 2019-08-23 DIAGNOSIS — J9611 Chronic respiratory failure with hypoxia: Secondary | ICD-10-CM | POA: Insufficient documentation

## 2019-08-23 DIAGNOSIS — S32000A Wedge compression fracture of unspecified lumbar vertebra, initial encounter for closed fracture: Secondary | ICD-10-CM | POA: Insufficient documentation

## 2019-08-23 DIAGNOSIS — R0602 Shortness of breath: Secondary | ICD-10-CM | POA: Diagnosis present

## 2019-08-23 IMAGING — DX DG ABDOMEN ACUTE W/ 1V CHEST
5 series · 5 of 5 positions shown · non-contrast
Comparison: [DATE].

CLINICAL DATA: Acute generalized abdominal pain and distention.

EXAM:
DG ABDOMEN ACUTE W/ 1V CHEST

[x chest ap (1 of 2)]
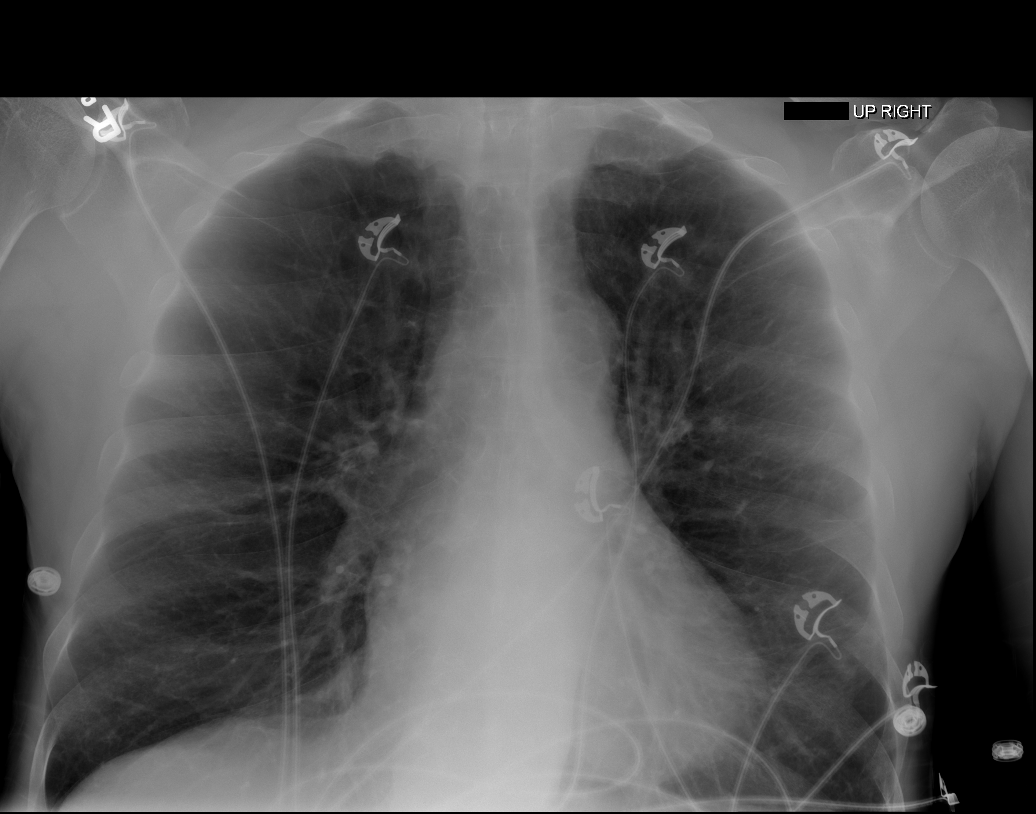

[x chest ap (2 of 2)]
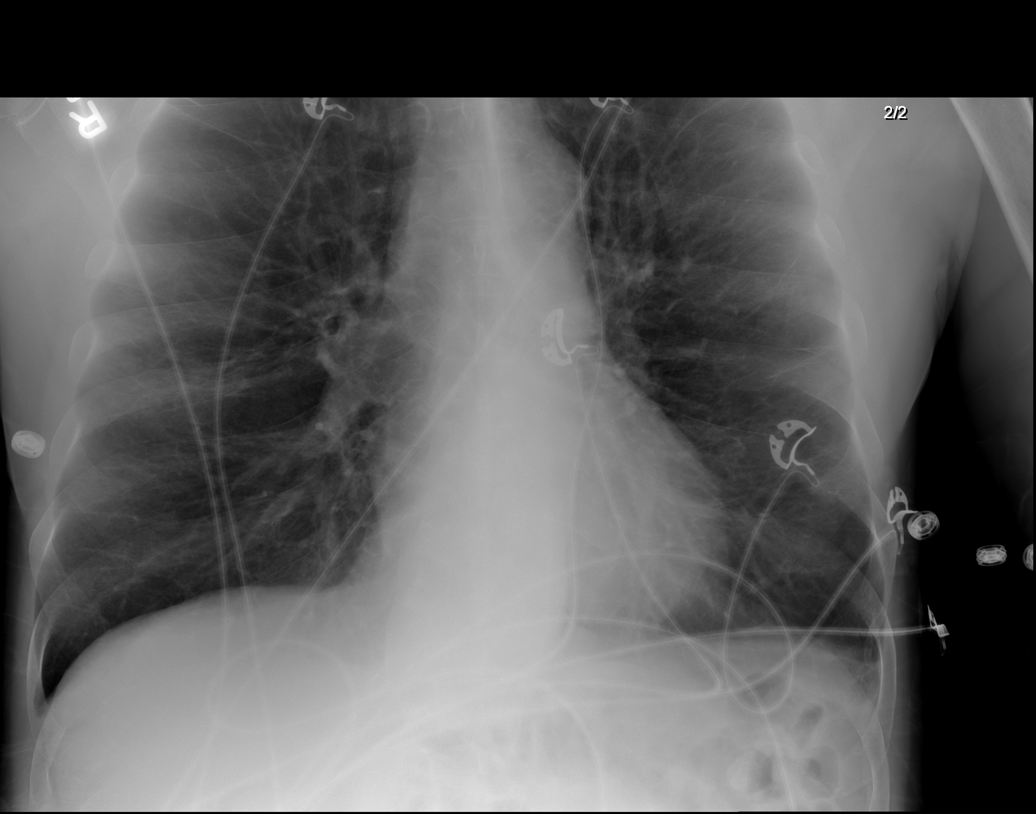

[w abdomen decub (1 of 2)]
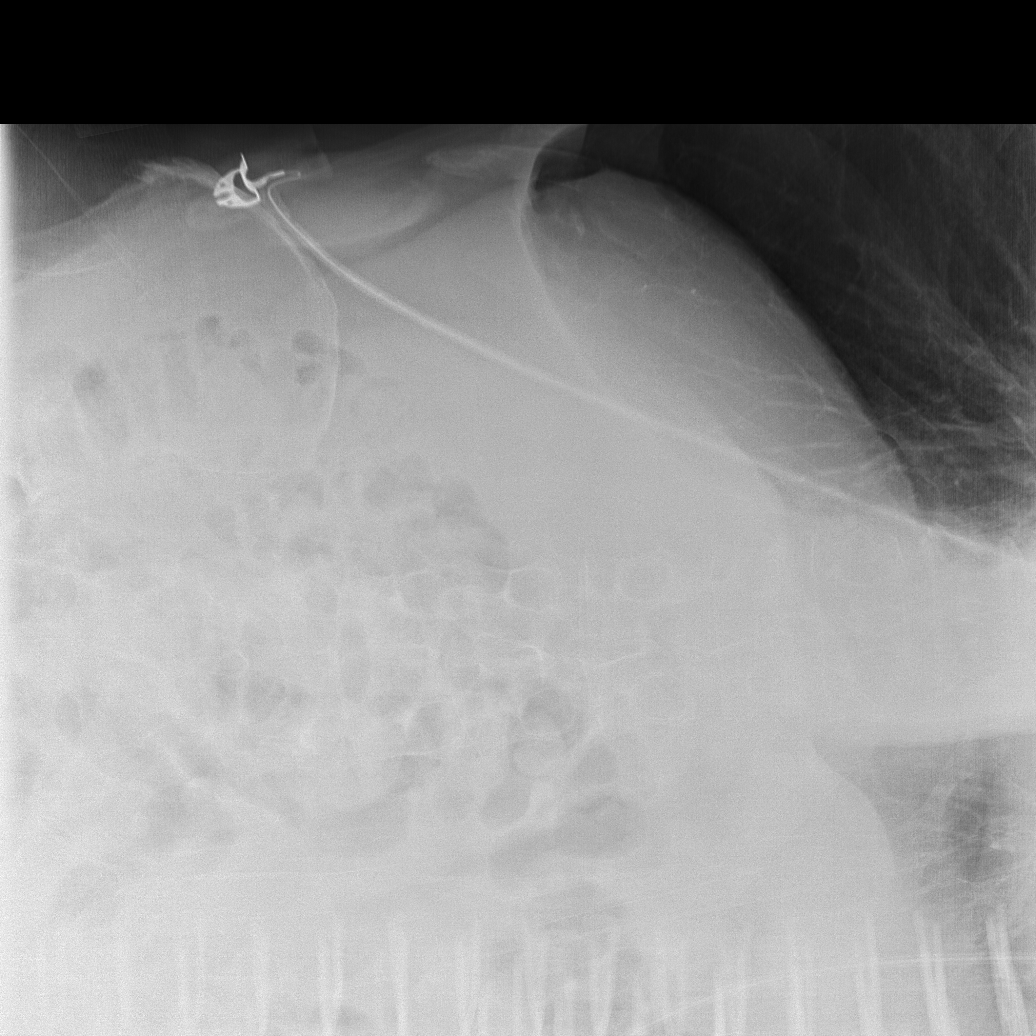

[w abdomen decub (2 of 2)]
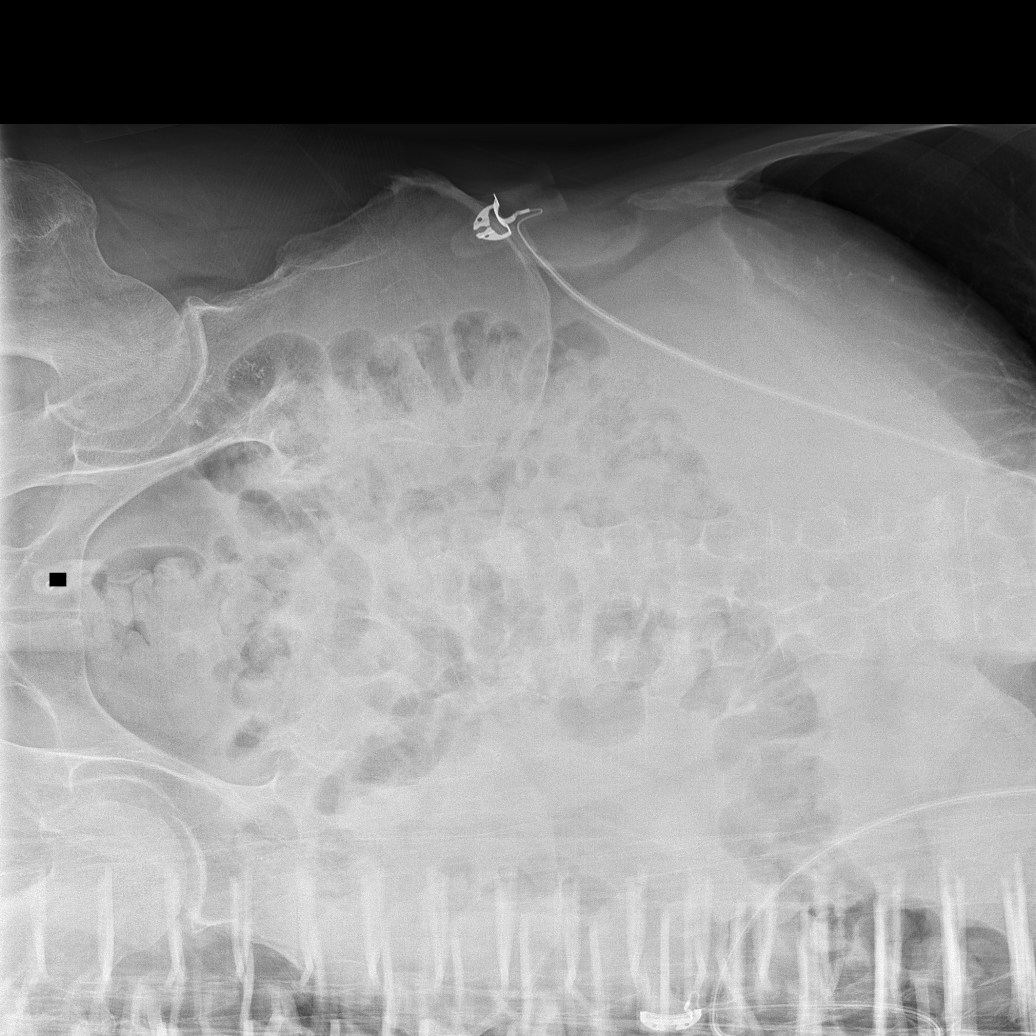

[t abdomen supine]
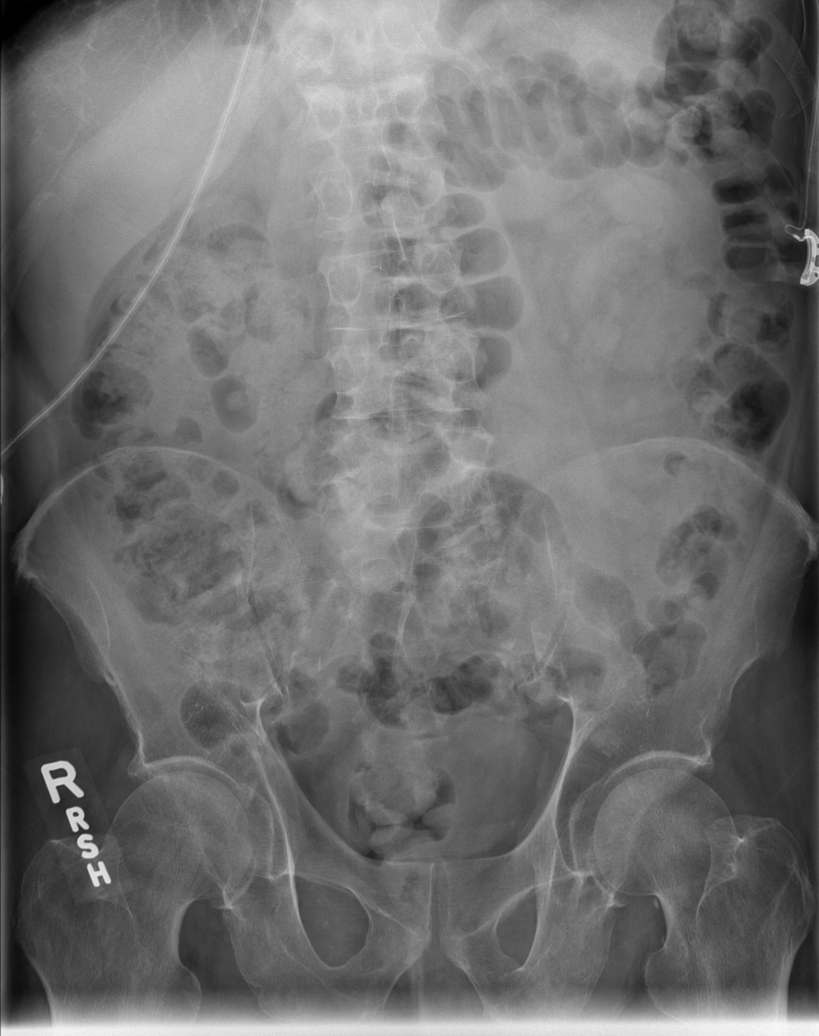

[5 of 5 positions shown; findings below may reference images not displayed]

FINDINGS: There is no evidence of dilated bowel loops or free intraperitoneal
air. No radiopaque calculi or other significant radiographic
abnormality is seen. Heart size and mediastinal contours are within
normal limits. Both lungs are clear.
IMPRESSION: Negative abdominal radiographs.  No acute cardiopulmonary disease.

## 2019-08-23 MED ORDER — ALBUTEROL SULFATE HFA 108 (90 BASE) MCG/ACT IN AERS
2.0000 | INHALATION_SPRAY | Freq: Four times a day (QID) | RESPIRATORY_TRACT | Status: DC
  Administered 2019-08-23: 2 via RESPIRATORY_TRACT
  Filled 2019-08-23: qty 6.7

## 2019-08-23 MED ORDER — POLYETHYLENE GLYCOL 3350 17 G PO PACK: 17.0000 g | PACK | Freq: Every day | ORAL | 1 refills | Status: DC

## 2019-08-23 MED ORDER — HYDROMORPHONE HCL 1 MG/ML IJ SOLN
2.0000 mg | Freq: Once | INTRAMUSCULAR | Status: AC
  Administered 2019-08-23: 2 mg via INTRAMUSCULAR
  Filled 2019-08-23: qty 2

## 2019-08-23 NOTE — ED Notes (Signed)
DC instructions discussed and pt verbalizes understanding. Currently eating meal and awaioting ptaqr transport.
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
## 2019-08-23 NOTE — ED Notes (Signed)
Pt transported via [facility]

## 2019-08-23 NOTE — ED Triage Notes (Signed)
Pt arrives EMS from home with c/o increased SHOB over 3-4 days and c/o distended abdomen and decreased stool over last 2 days with small bm yesterday.Marland Kitchen

## 2019-08-23 NOTE — ED Notes (Signed)
Patient transported to X-ray 

## 2019-08-23 NOTE — Discharge Instructions (Addendum)
Follow-up with neurosurgery.  Recommend taking the MiraLAX daily to help counteract any constipation from the pain medication.  Use the albuterol inhaler as directed by your pulmonary medicine doctor.  Additional pain medication for you is blocked currently due to the prescriptions he received to the beginning of the month.

## 2019-08-25 ENCOUNTER — Other Ambulatory Visit: Payer: Self-pay

## 2019-08-25 ENCOUNTER — Ambulatory Visit: Payer: Medicaid Other | Admitting: Physician Assistant

## 2019-08-25 ENCOUNTER — Encounter: Payer: Self-pay | Admitting: Physician Assistant

## 2019-08-25 VITALS — BP 100/76 | HR 72

## 2019-08-25 DIAGNOSIS — K5900 Constipation, unspecified: Secondary | ICD-10-CM | POA: Diagnosis not present

## 2019-08-25 DIAGNOSIS — R140 Abdominal distension (gaseous): Secondary | ICD-10-CM | POA: Diagnosis not present

## 2019-08-25 NOTE — Patient Instructions (Addendum)
Amy Esterwood, PA-C recommends that you complete a bowel purge (to clean out your bowels). Please do the following: Purchase a bottle of Miralax over the counter as well as a box of 5 mg dulcolax tablets. Take 4 dulcolax tablets. Wait 1 hour. You will then drink 6-8 capfuls of Miralax mixed in an adequate amount of water/juice/gatorade (you may choose which of these liquids to drink) over the next 2-3 hours. You should expect results within 1 to 6 hours after completing the bowel purge.  Take IBGard , Samples given  Take 2 twice daily  Drink 60 oz water daily  Take Miralax daily one dose every day in 8 oz water   Can try Gas X- one before each meal for gas  Eat more fruits and veggies daily  Limit eating cheese  Limit red meat  AVOID Artifical sweeteners    If you are age 56 or older, your body mass index should be between 23-30. Your There is no height or weight on file to calculate BMI. If this is out of the aforementioned range listed, please consider follow up with your Primary Care Provider.  If you are age 94 or younger, your body mass index should be between 19-25. Your There is no height or weight on file to calculate BMI. If this is out of the aformentioned range listed, please consider follow up with your Primary Care Provider.    I appreciate the  opportunity to care for you  Thank You   Amy Shane Crutch
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
## 2019-08-26 ENCOUNTER — Encounter (HOSPITAL_COMMUNITY): Payer: Self-pay

## 2019-08-26 ENCOUNTER — Ambulatory Visit (HOSPITAL_COMMUNITY)
Admission: RE | Admit: 2019-08-26 | Discharge: 2019-08-26 | Disposition: A | Discharge status: Home / Self Care | Payer: Medicaid Other | Source: Ambulatory Visit | Attending: Neurosurgery | Admitting: Neurosurgery

## 2019-08-26 DIAGNOSIS — S32001A Stable burst fracture of unspecified lumbar vertebra, initial encounter for closed fracture: Secondary | ICD-10-CM | POA: Diagnosis not present

## 2019-08-26 DIAGNOSIS — G959 Disease of spinal cord, unspecified: Secondary | ICD-10-CM

## 2019-08-26 IMAGING — MR MR LUMBAR SPINE WO/W CM
6 of 16 series · 17 of 48 positions shown · IV contrast (gadavist)
Comparison: Radiograph [DATE], CT [DATE]

CLINICAL DATA: Prior compression fractures in the last several
months, increased bloating and pain

EXAM:
MRI LUMBAR SPINE WITHOUT AND WITH CONTRAST
TECHNIQUE: Multiplanar and multiecho pulse sequences of the lumbar spine were
obtained without and with intravenous contrast.
CONTRAST:  7mL GADAVIST GADOBUTROL 1 MMOL/ML IV SOLN

[Series 3: T2 · sagittal · 3.0mm · 0.43mm/px · 3 of 16 slices shown (1 of 5)]
[im 1/16]
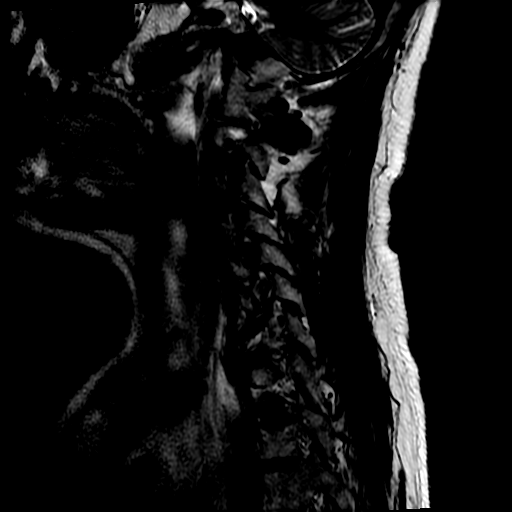
[im 8/16]
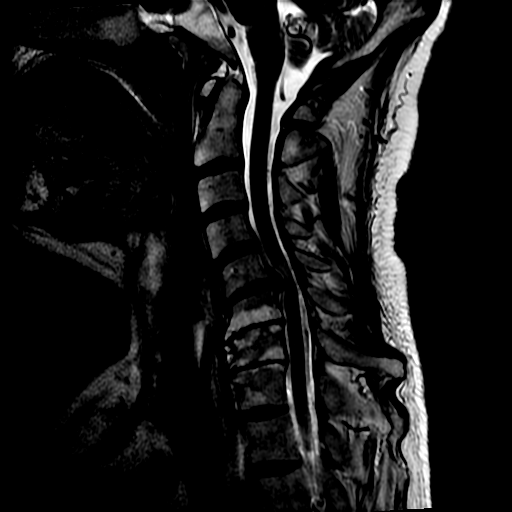
[im 16/16]
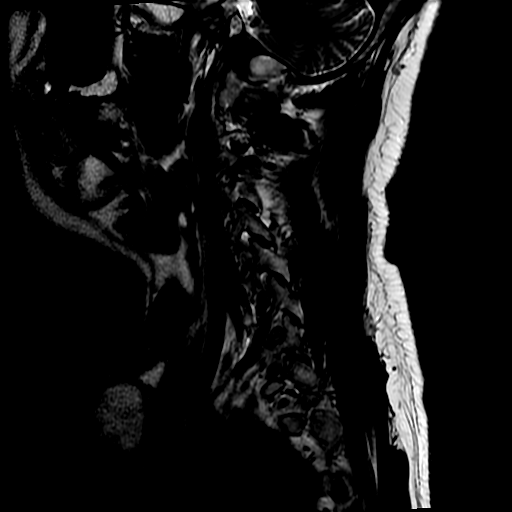

[Series 7: T2 · axial · 3.0mm · 0.35mm/px · z∈[-131,-43]mm · 3 of 30 slices shown (2 of 5)]
[im 1/30]
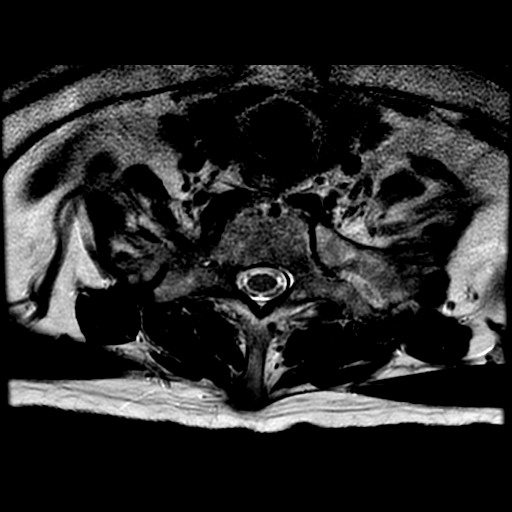
[im 15/30]
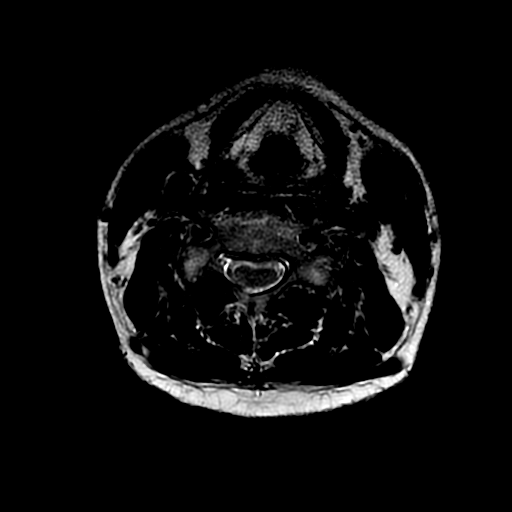
[im 30/30]
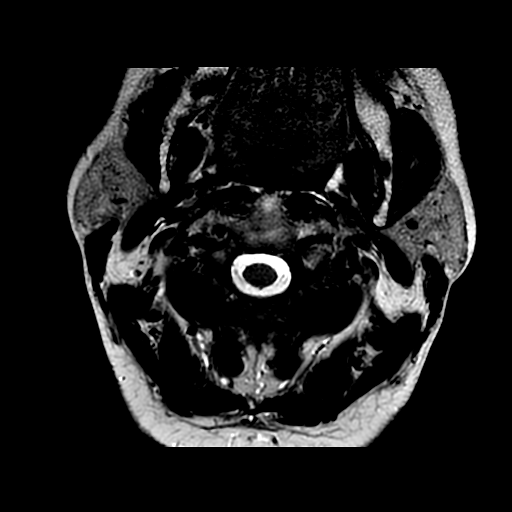

[Series 8: T1 · axial · non-contrast · 3.0mm · 0.35mm/px · z∈[-131,-43]mm · 3 of 30 slices shown]
[im 1/30]
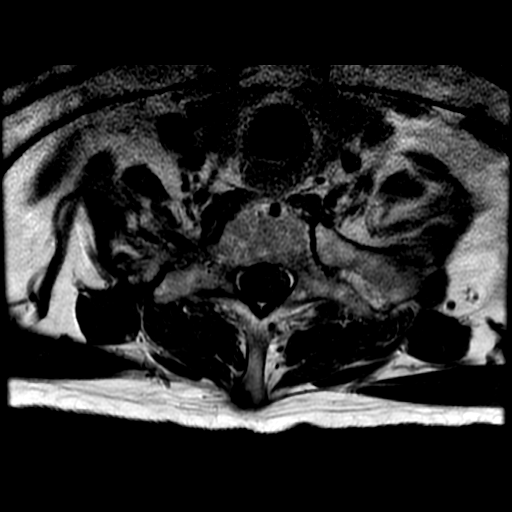
[im 15/30]
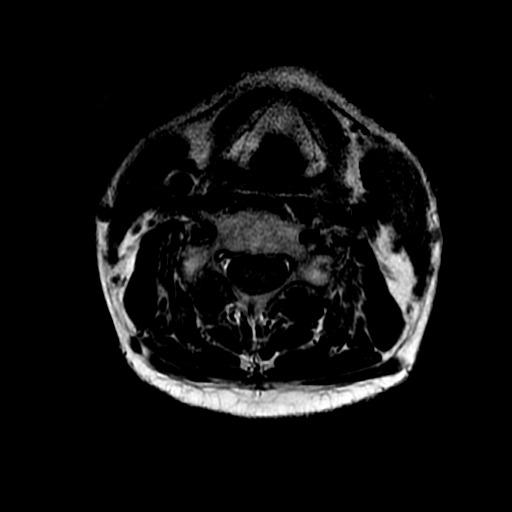
[im 30/30]
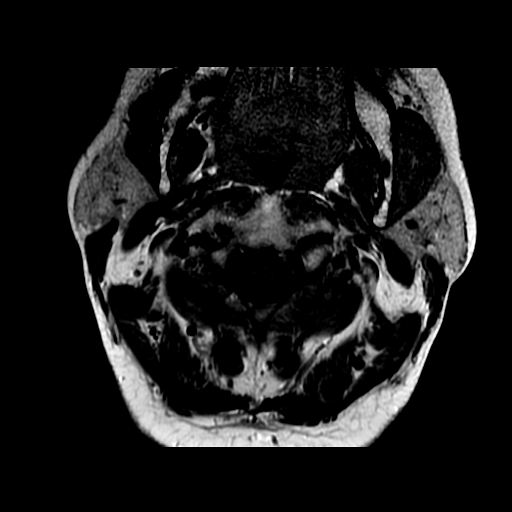

[Series 9: T2 · sagittal · 3.0mm · 0.43mm/px · 2 of 16 slices shown (3 of 5)]
[im 1/16]
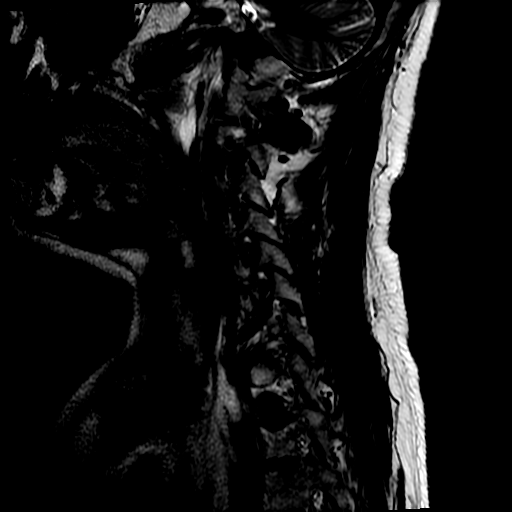
[im 16/16]
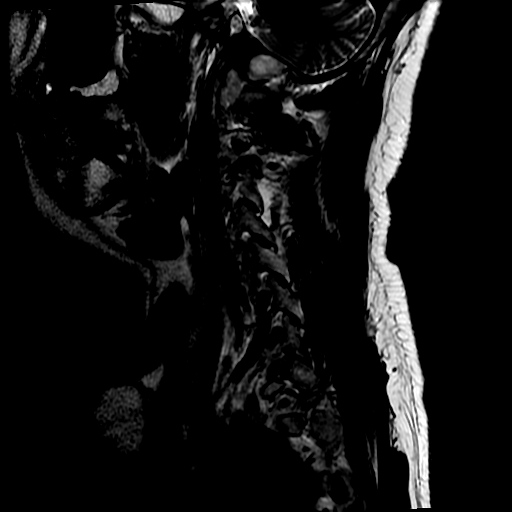

[Series 12: T2 · sagittal · 4.0mm · 0.55mm/px · 1 of 13 slices shown (4 of 5)]
[im 1/13]
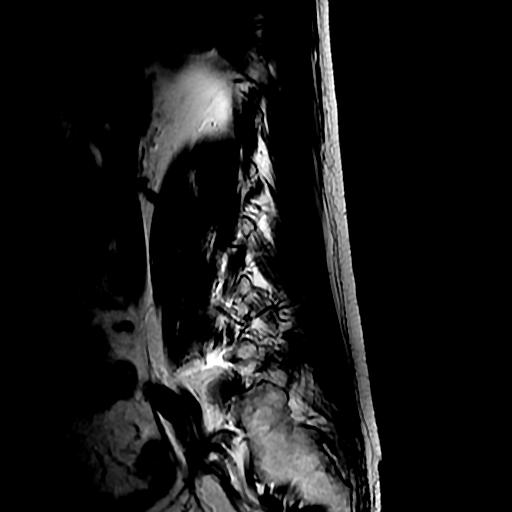

[Series 15: T2 · axial · 4.0mm · 0.39mm/px · z∈[-602,-361]mm · 5 of 46 slices shown (5 of 5)]
[im 1/46]
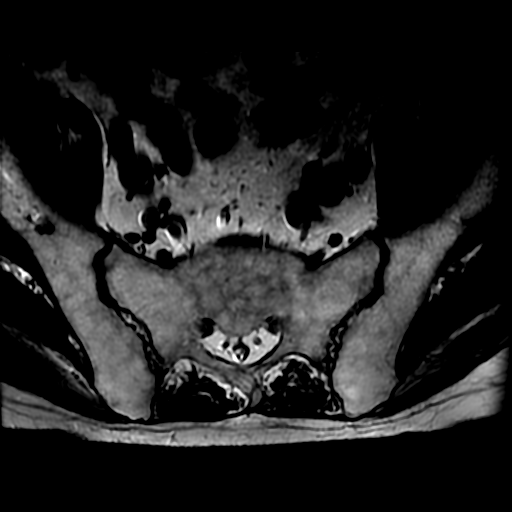
[im 12/46]
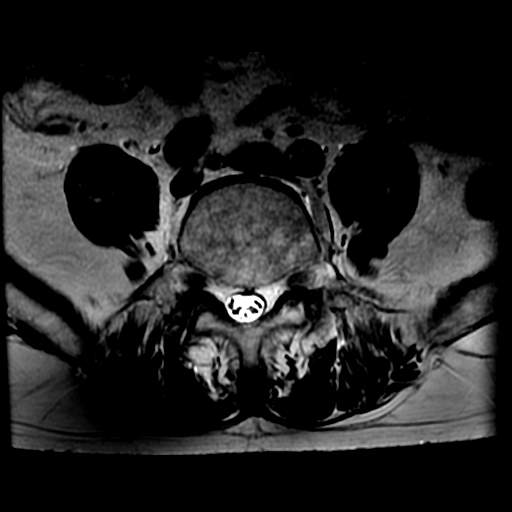
[im 23/46]
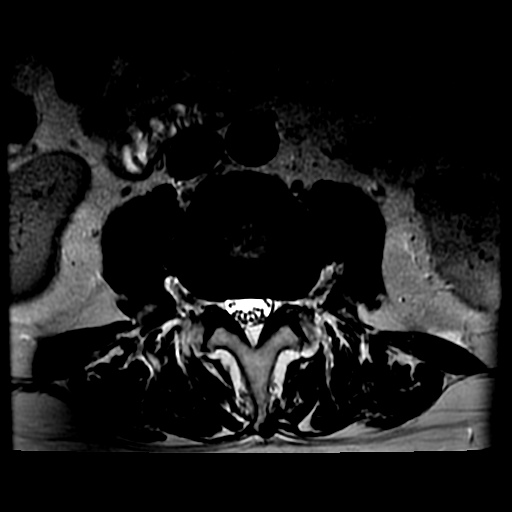
[im 34/46]
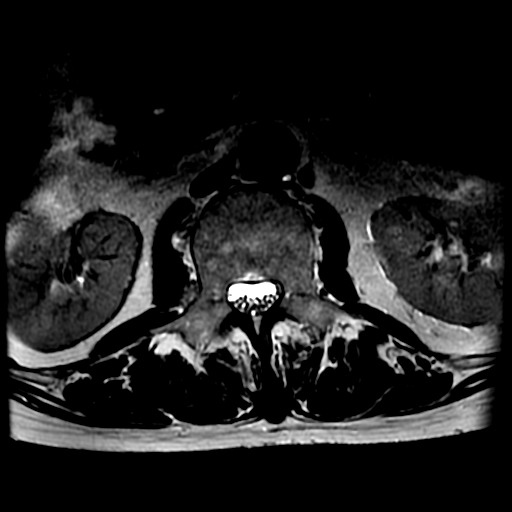
[im 46/46]
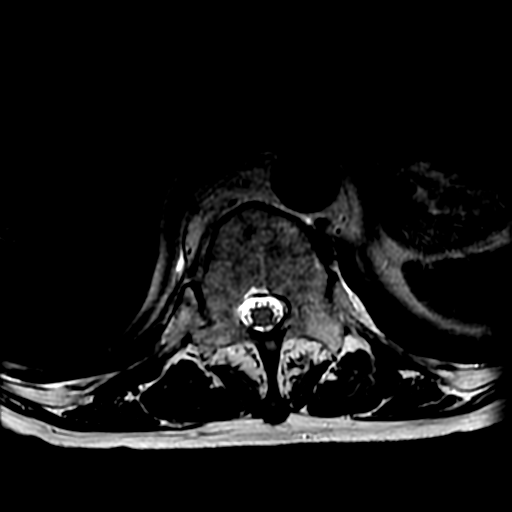

[17 of 48 positions shown; findings below may reference images not displayed]

FINDINGS: Segmentation: There are 5 non-rib bearing lumbar type vertebral
bodies with the last intervertebral disc space labeled as L5-S1.

Alignment:  Normal

Vertebrae: There is a chronic compression deformity of the L1
vertebral body with approximately 50% loss in vertebral body height.
Mild surrounding marrow edema seen. There is also a new compression
deformity of the L2 vertebral body with approximately 25% loss in
vertebral body height and marrow edema in the inferior endplate.
There is buckling of the posterior cortex without retropulsion of
fragments. Again noted is a chronic compression/burst deformity of
the L4 vertebral body with 50% loss in vertebral body height. There
is buckling of the anterior superior cortex. There is mild
surrounding marrow edema. No retropulsion of fragments however is
noted. No areas of abnormal enhancement are seen to signify
pathologic marrow infiltration.

Conus medullaris and cauda equina: Conus extends to the L1 level.
Conus and cauda equina appear normal.

Paraspinal and other soft tissues: The paraspinal soft tissues and
visualized retroperitoneal structures are unremarkable. The
sacroiliac joints are intact.

Disc levels:

T12-L1:  No significant canal or neural foraminal narrowing.

L1-L2: Facet arthrosis is seen which causes mild bilateral neural
foraminal narrowing.

L2-L3: There is buckling of the posterior cortex with a broad-based
disc bulge and facet arthrosis which causes moderate bilateral
neural foraminal narrowing. The central thecal sac is mildly
effaced.

L3-L4: There is a broad-based disc bulge with facet arthrosis which
causes moderate bilateral neural foraminal narrowing.

L4-L5: There is a broad-based disc bulge and ligamentum flavum
hypertrophy which causes severe bilateral neural foraminal
narrowing. The central thecal sac is mildly effaced measuring 7 mm
in AP diameter.

L5-S1: There is a broad-based disc bulge with a central disc
protrusion which contacts the bilateral descending S1 nerve roots.
There is severe bilateral neural foraminal narrowing. There is mild
effacement anterior thecal sac.
IMPRESSION: 1. Since the prior CT [DATE] new compression deformity of
the L2 vertebral body with less than 25% loss in height and
surrounding marrow edema. No retropulsion of fragments.
2. Unchanged compression deformities of L1 and L4 with 50% loss in
vertebral body height and slight buckling of the posterior cortex.
3. Lumbar spine spondylosis most notable at L4-L5 with severe
bilateral neural foraminal narrowing and mild central canal stenosis
and at L5-S1 with a small central disc protrusion contacting the
bilateral descending S1 nerve roots with severe bilateral neural
foraminal narrowing.
4. No evidence of pathologic marrow infiltration.

These results were called by telephone at the time of interpretation
on [DATE] at [DATE] to provider SHEMYRELLE , who verbally
acknowledged these results.

## 2019-08-26 IMAGING — MR MR CERVICAL SPINE WO/W CM
7 of 16 series · 18 of 48 positions shown · IV contrast (7 GAD)
Comparison: None.

CLINICAL DATA: Compression fracture of L1, L4, and L5 in the last
3-4 months increased sensation of bloating and distension

EXAM:
MRI CERVICAL SPINE WITHOUT AND WITH CONTRAST
TECHNIQUE: Multiplanar and multiecho pulse sequences of the cervical spine, to
include the craniocervical junction and cervicothoracic junction,
were obtained without and with intravenous contrast.
CONTRAST:  7mL GADAVIST GADOBUTROL 1 MMOL/ML IV SOLN

[Series 3: T2 · sagittal · 3.0mm · 0.43mm/px · 3 of 16 slices shown (1 of 5)]
[im 1/16]
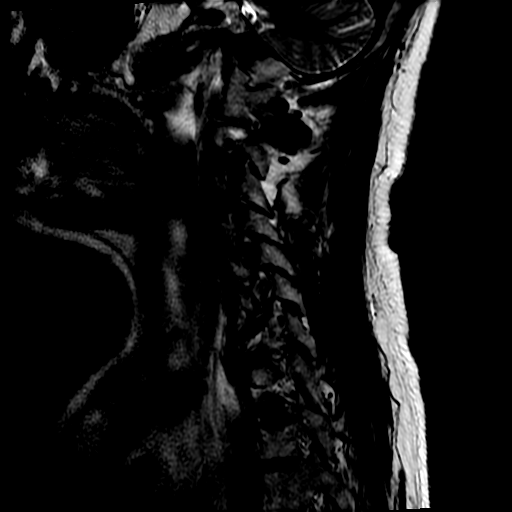
[im 8/16]
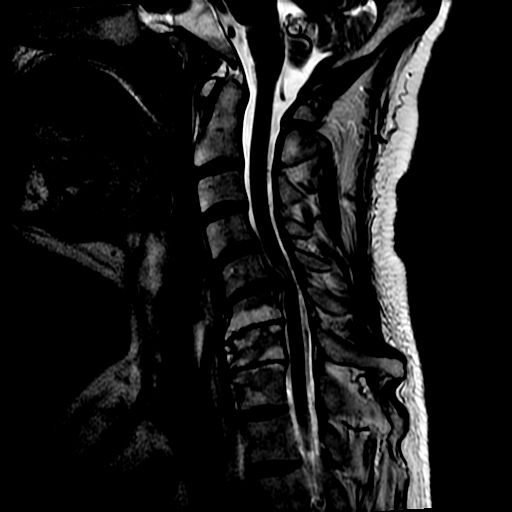
[im 16/16]
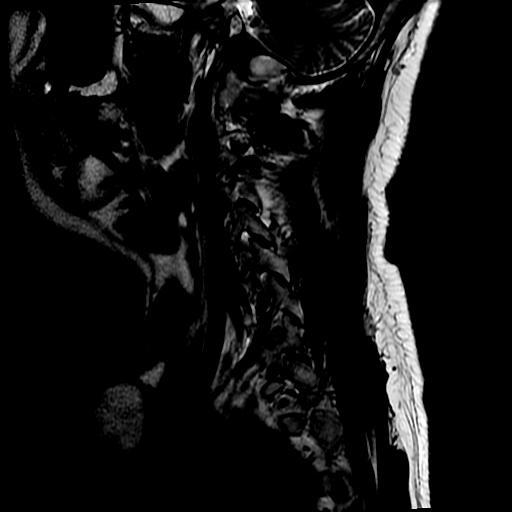

[Series 7: T2 · axial · 3.0mm · 0.35mm/px · z∈[-131,-43]mm · 3 of 30 slices shown (2 of 5)]
[im 1/30]
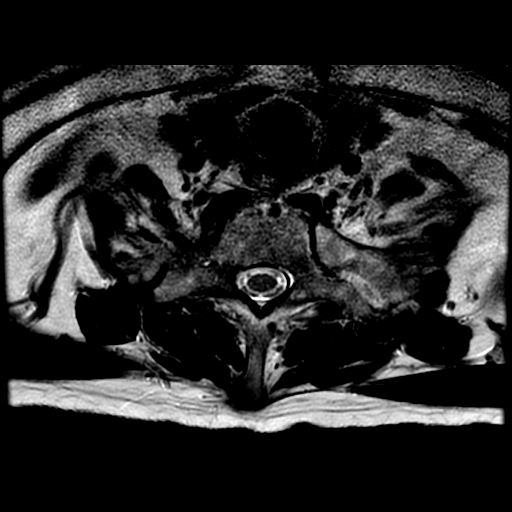
[im 15/30]
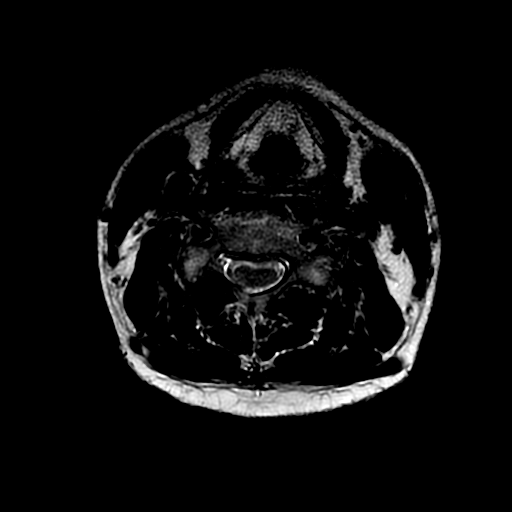
[im 30/30]
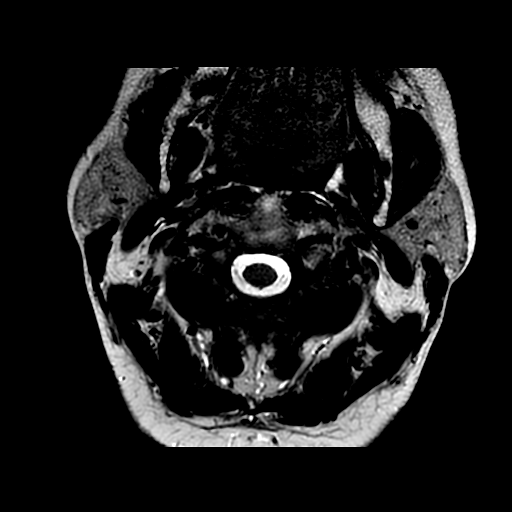

[Series 8: T1 · axial · non-contrast · 3.0mm · 0.35mm/px · z∈[-131,-43]mm · 3 of 30 slices shown (1 of 2)]
[im 1/30]
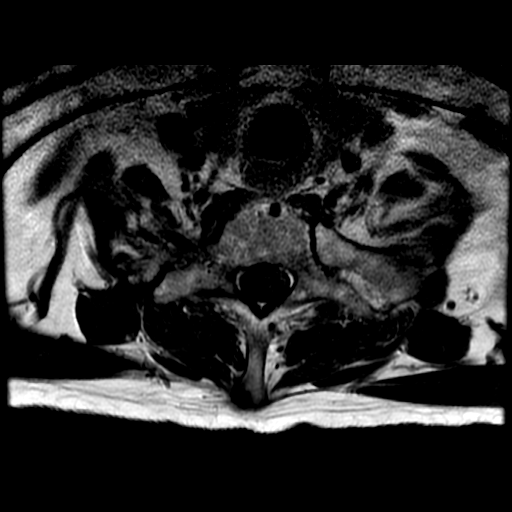
[im 15/30]
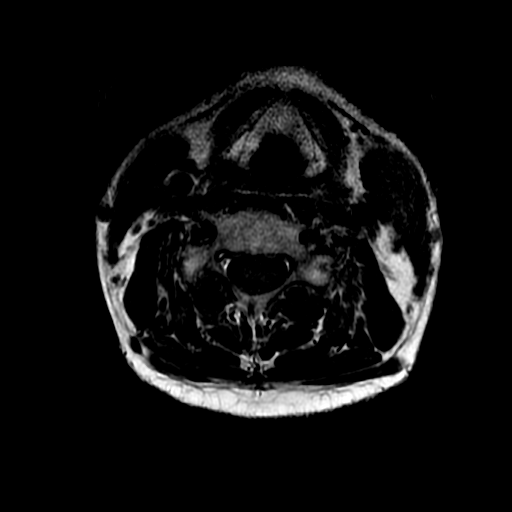
[im 30/30]
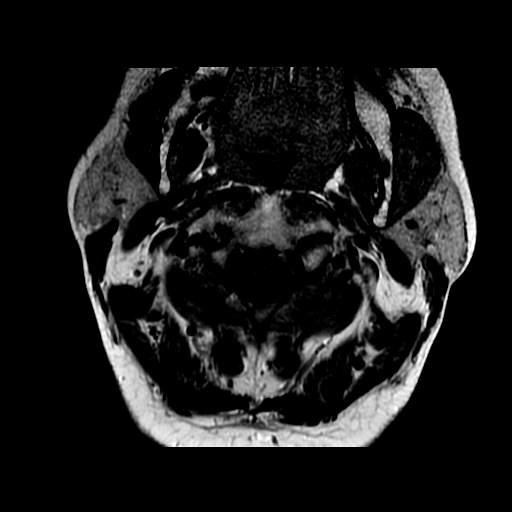

[Series 9: T2 · sagittal · 3.0mm · 0.43mm/px · 2 of 16 slices shown (3 of 5)]
[im 1/16]
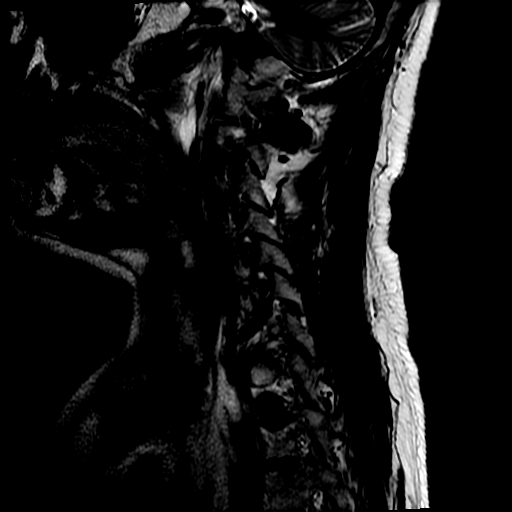
[im 16/16]
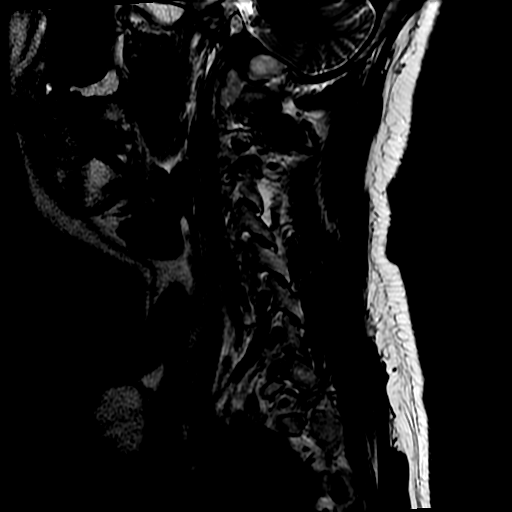

[Series 12: T2 · sagittal · 4.0mm · 0.55mm/px · 1 of 13 slices shown (4 of 5)]
[im 1/13]
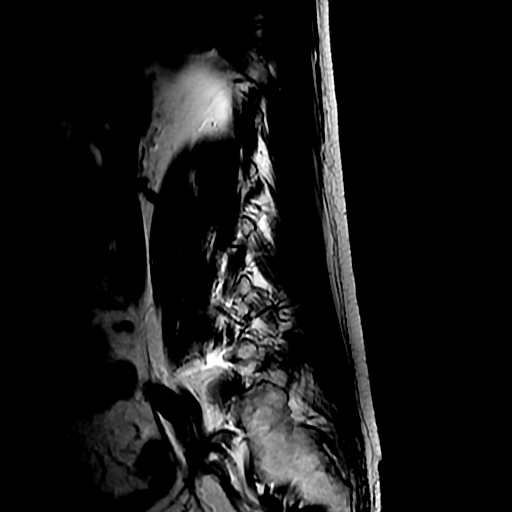

[Series 14: T1 · sagittal · 4.0mm · 0.55mm/px · 1 of 13 slices shown (2 of 2)]
[im 1/13]
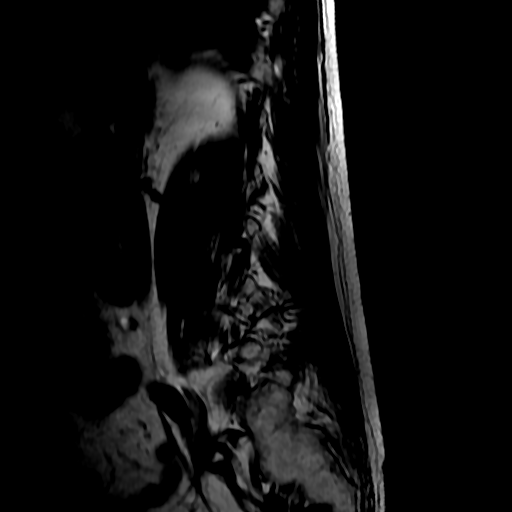

[Series 15: T2 · axial · 4.0mm · 0.39mm/px · z∈[-602,-361]mm · 5 of 46 slices shown (5 of 5)]
[im 1/46]
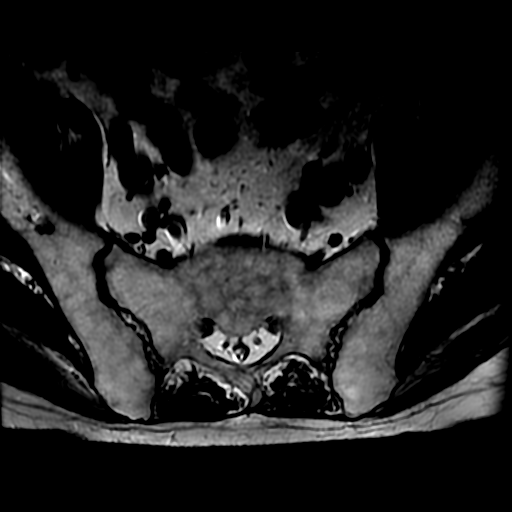
[im 12/46]
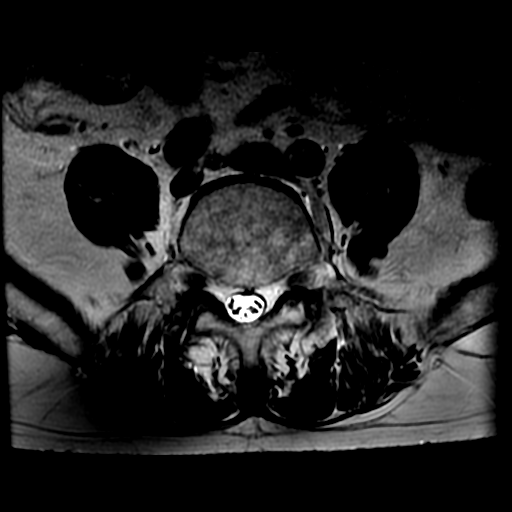
[im 23/46]
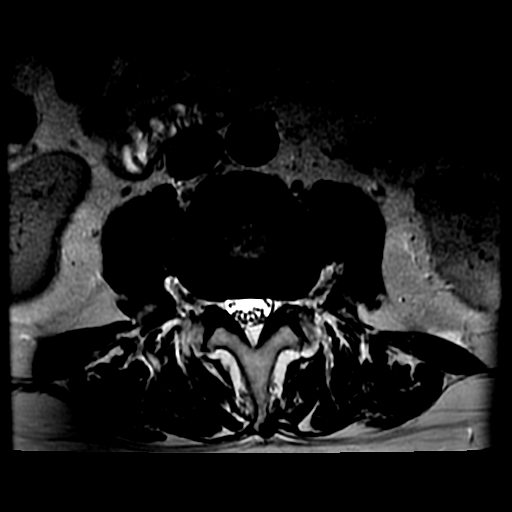
[im 34/46]
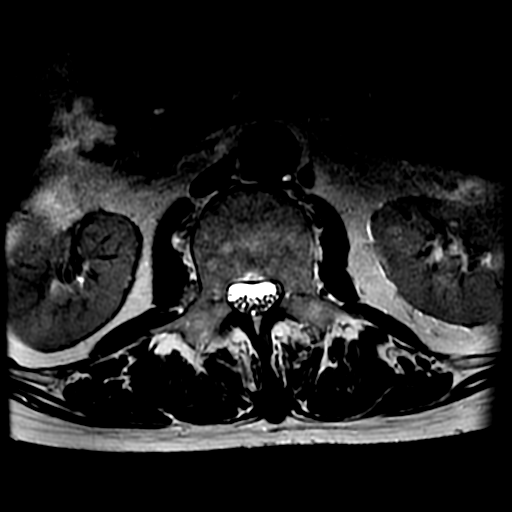
[im 46/46]
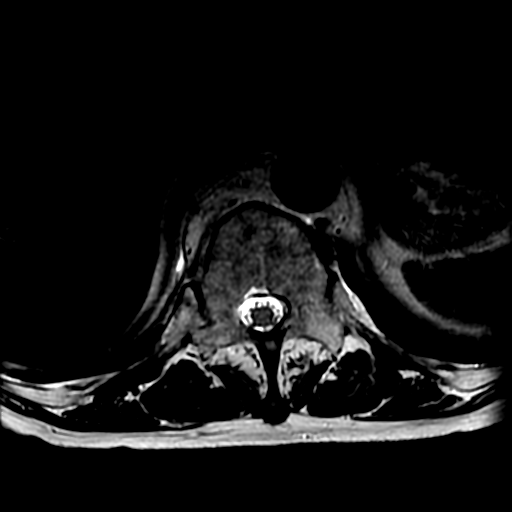

[18 of 48 positions shown; findings below may reference images not displayed]

FINDINGS: Alignment: There is slight reversal of the normal cervical lordosis.
High suspected to

Vertebrae: The vertebral body heights are well maintained. No
fracture, marrow edema,or pathologic marrow infiltration. Fatty
endplate changes are seen at C6-C7, and C7-T1. Disc height loss with
anterior osteophytes are also seen predominantly at C6-C7 and C7-T1.
No areas of abnormal enhancement are seen to signify pathologic
marrow infiltration.

Cord: Increased T2 cord signal is seen at the C5-C6 which is seen
diffusely, likely cord edema. No abnormal leptomeningeal enhancement
is seen.

Posterior Fossa, vertebral arteries, paraspinal tissues:

The visualized portion of the posterior fossa is unremarkable.
Normal flow voids seen within the vertebral arteries. There is mild
edema within the supraspinous soft tissues from C5 through C7.

Disc levels:

C1-C2: Atlanto-axial junction is normal, without canal narrowing

C2-C3: Minimal disc osteophyte complex is seen, however no
significant canal or neural foraminal narrowing.

C3-C4: Disc osteophyte complex and uncovertebral osteophytes are
seen which causes mild bilateral neural foraminal narrowing.

C4-C5: Disc osteophyte complex and uncovertebral osteophytes are
seen which causes mild bilateral neural foraminal narrowing.

C5-C6: Disc osteophyte complex is seen with a left paracentral disc
protrusion measuring approximately 5 mm in AP dimension. The disc
protrusion contacts the exiting left C6 nerve root. There is
moderate bilateral neural foraminal narrowing. The central thecal
sac measures 4 mm in AP diameter and there is increased STIR signal
seen diffusely through the cord.

C6-C7: There is a minimal disc osteophyte complex with disc
degeneration which causes severe bilateral neural foraminal
narrowing.

C7-T1: There is disc height loss with degeneration and disc
osteophyte complex which causes severe bilateral neural foraminal
narrowing.
IMPRESSION: 1. Cervical spine spondylosis most notable C5-C6 with central cord
edema from a left paracentral disc protrusion which contacts the
exiting left C6 nerve root. There is also moderate bilateral neural
foraminal narrowing and moderate to severe central canal stenosis.
2. No evidence of pathologic marrow infiltration.

These results were called by telephone at the time of interpretation
on [DATE] at [DATE] to provider LORRAINE , who verbally
acknowledged these results.

## 2019-08-26 MED ORDER — GADOBUTROL 1 MMOL/ML IV SOLN
7.0000 mL | Freq: Once | INTRAVENOUS | Status: AC | PRN
  Administered 2019-08-26: 7 mL via INTRAVENOUS

## 2019-08-26 NOTE — Progress Notes (Signed)
Assessment and plan reviewed 

## 2019-08-27 ENCOUNTER — Encounter: Payer: Self-pay | Admitting: Gastroenterology

## 2019-08-27 ENCOUNTER — Encounter: Payer: Medicaid Other | Admitting: Vascular Surgery

## 2019-08-27 ENCOUNTER — Emergency Department (HOSPITAL_COMMUNITY)
Admission: EM | Admit: 2019-08-27 | Discharge: 2019-08-28 | Disposition: A | Discharge status: Home / Self Care | Payer: Medicaid Other | Attending: Emergency Medicine | Admitting: Emergency Medicine

## 2019-08-27 DIAGNOSIS — Z9981 Dependence on supplemental oxygen: Secondary | ICD-10-CM | POA: Insufficient documentation

## 2019-08-27 DIAGNOSIS — I10 Essential (primary) hypertension: Secondary | ICD-10-CM | POA: Diagnosis not present

## 2019-08-27 DIAGNOSIS — R109 Unspecified abdominal pain: Secondary | ICD-10-CM | POA: Diagnosis present

## 2019-08-27 DIAGNOSIS — R0602 Shortness of breath: Secondary | ICD-10-CM | POA: Insufficient documentation

## 2019-08-27 DIAGNOSIS — Z79899 Other long term (current) drug therapy: Secondary | ICD-10-CM | POA: Diagnosis not present

## 2019-08-27 DIAGNOSIS — F17210 Nicotine dependence, cigarettes, uncomplicated: Secondary | ICD-10-CM | POA: Insufficient documentation

## 2019-08-27 DIAGNOSIS — J449 Chronic obstructive pulmonary disease, unspecified: Secondary | ICD-10-CM | POA: Insufficient documentation

## 2019-08-27 DIAGNOSIS — R140 Abdominal distension (gaseous): Secondary | ICD-10-CM | POA: Insufficient documentation

## 2019-08-27 MED ORDER — DIAZEPAM 5 MG/ML IJ SOLN
5.0000 mg | Freq: Once | INTRAMUSCULAR | Status: AC
  Administered 2019-08-27: 5 mg via INTRAVENOUS
  Filled 2019-08-27: qty 2

## 2019-08-27 NOTE — ED Triage Notes (Signed)
Pt c/o SOB and "stomach muscle tightness". Pt poor historian, refusing to answer questions due to his SOB. Pt unclear on his oxygen regimen at home. Endorses hx of COPD.

## 2019-08-27 NOTE — ED Provider Notes (Signed)
  10:45 PM I have gone into patient's room on 2 separate occasions to try and evaluate him since arrival, he refuses to answer any questions.  He continues complaining of SOB on 5L [place].  I have tried to apply NRB mask and he gets aggressive and starts yelling.     Larene Pickett, PA-C 08/27/19 2246    Rex Kras, Wenda Overland, MD 08/30/19 [phone]

## 2019-08-27 NOTE — Progress Notes (Signed)
Call this evening by patient's brother. States patient has significant pain and discomfort and that his abdomen is tense with his muscles taut.  Discomfort cannot improve with him lying down. Muscles of his abdomen on both sides remain tight. Unable to visualize the patient based on this telephone call and with the severity of the pain that is being described and it not being able to be "relaxed" he needs to be evaluated at an urgent  care or emergency department to ensure that he does not have a abdominal hernia or if you were to have some sort of muscle spasm that may require muscle relaxers. The patient's brother asked whether there was anything else we could do of the phone I told him without the physical nature of being able to see or evaluate his brother and if the pain is so severe that he cannot wait then 911 or a trip to the emergency room should be perform ed.  He asked amid I would be able to evaluate him in the emergency room.  If the patient goes to the emergency department he will need to be evaluated by the emergency room staff first and if it is deemed necessary to become and inpatient then GI can evaluate the patient further if neede be.  Patient's brother appreciated the call back.  Justice Britain, MD

## 2019-08-28 ENCOUNTER — Encounter (HOSPITAL_COMMUNITY): Payer: Self-pay | Admitting: Emergency Medicine

## 2019-08-28 ENCOUNTER — Emergency Department (HOSPITAL_COMMUNITY)
Admission: EM | Admit: 2019-08-28 | Discharge: 2019-08-28 | Disposition: A | Discharge status: Home / Self Care | Payer: Medicaid Other | Source: Home / Self Care | Attending: Emergency Medicine | Admitting: Emergency Medicine

## 2019-08-28 ENCOUNTER — Other Ambulatory Visit: Payer: Self-pay

## 2019-08-28 ENCOUNTER — Emergency Department (HOSPITAL_COMMUNITY): Payer: Medicaid Other

## 2019-08-28 DIAGNOSIS — F17210 Nicotine dependence, cigarettes, uncomplicated: Secondary | ICD-10-CM | POA: Insufficient documentation

## 2019-08-28 DIAGNOSIS — Z85038 Personal history of other malignant neoplasm of large intestine: Secondary | ICD-10-CM | POA: Insufficient documentation

## 2019-08-28 DIAGNOSIS — R109 Unspecified abdominal pain: Secondary | ICD-10-CM

## 2019-08-28 DIAGNOSIS — F1210 Cannabis abuse, uncomplicated: Secondary | ICD-10-CM | POA: Insufficient documentation

## 2019-08-28 DIAGNOSIS — J449 Chronic obstructive pulmonary disease, unspecified: Secondary | ICD-10-CM | POA: Insufficient documentation

## 2019-08-28 DIAGNOSIS — Z79899 Other long term (current) drug therapy: Secondary | ICD-10-CM | POA: Insufficient documentation

## 2019-08-28 DIAGNOSIS — R0602 Shortness of breath: Secondary | ICD-10-CM | POA: Insufficient documentation

## 2019-08-28 DIAGNOSIS — I10 Essential (primary) hypertension: Secondary | ICD-10-CM | POA: Insufficient documentation

## 2019-08-28 LAB — COMPREHENSIVE METABOLIC PANEL
ALT: 21 U/L (ref 0–44)
AST: 20 U/L (ref 15–41)
Albumin: 3.4 g/dL — ABNORMAL LOW (ref 3.5–5.0)
Alkaline Phosphatase: 149 U/L — ABNORMAL HIGH (ref 38–126)
Anion gap: 12 (ref 5–15)
BUN: 11 mg/dL (ref 6–20)
CO2: 27 mmol/L (ref 22–32)
Calcium: 9.4 mg/dL (ref 8.9–10.3)
Chloride: 102 mmol/L (ref 98–111)
Creatinine, Ser: 0.85 mg/dL (ref 0.61–1.24)
GFR calc Af Amer: >60 mL/min (ref >60)
GFR calc non Af Amer: >60 mL/min (ref >60)
Glucose, Bld: 83 mg/dL (ref 70–99)
Potassium: 3.9 mmol/L (ref 3.5–5.1)
Sodium: 141 mmol/L (ref 135–145)
Total Bilirubin: 1 mg/dL (ref 0.3–1.2)
Total Protein: 6.5 g/dL (ref 6.5–8.1)

## 2019-08-28 LAB — CBC
HCT: 43.3 % (ref 39.0–52.0)
Hemoglobin: 14 g/dL (ref 13.0–17.0)
MCH: 32.5 pg (ref 26.0–34.0)
MCHC: 32.3 g/dL (ref 30.0–36.0)
MCV: 100.5 fL — ABNORMAL HIGH (ref 80.0–100.0)
Platelets: 339 10*3/uL (ref 150–400)
RBC: 4.31 MIL/uL (ref 4.22–5.81)
RDW: 14.5 % (ref 11.5–15.5)
WBC: 15 10*3/uL — ABNORMAL HIGH (ref 4.0–10.5)
nRBC: 0 % (ref 0.0–0.2)

## 2019-08-28 LAB — BASIC METABOLIC PANEL
Anion gap: 13 (ref 5–15)
BUN: 13 mg/dL (ref 6–20)
CO2: 26 mmol/L (ref 22–32)
Calcium: 9.5 mg/dL (ref 8.9–10.3)
Chloride: 101 mmol/L (ref 98–111)
Creatinine, Ser: 0.88 mg/dL (ref 0.61–1.24)
GFR calc Af Amer: >60 mL/min (ref >60)
GFR calc non Af Amer: >60 mL/min (ref >60)
Glucose, Bld: 96 mg/dL (ref 70–99)
Potassium: 4.1 mmol/L (ref 3.5–5.1)
Sodium: 140 mmol/L (ref 135–145)

## 2019-08-28 LAB — CBC WITH DIFFERENTIAL/PLATELET
Abs Immature Granulocytes: 0.26 10*3/uL — ABNORMAL HIGH (ref 0.00–0.07)
Basophils Absolute: 0.1 10*3/uL (ref 0.0–0.1)
Basophils Relative: 0 %
Eosinophils Absolute: 0 10*3/uL (ref 0.0–0.5)
Eosinophils Relative: 0 %
HCT: 42.9 % (ref 39.0–52.0)
Hemoglobin: 13.9 g/dL (ref 13.0–17.0)
Immature Granulocytes: 1 %
Lymphocytes Relative: 9 %
Lymphs Abs: 1.6 10*3/uL (ref 0.7–4.0)
MCH: 32.3 pg (ref 26.0–34.0)
MCHC: 32.4 g/dL (ref 30.0–36.0)
MCV: 99.5 fL (ref 80.0–100.0)
Monocytes Absolute: 1.4 10*3/uL — ABNORMAL HIGH (ref 0.1–1.0)
Monocytes Relative: 7 %
Neutro Abs: 15.5 10*3/uL — ABNORMAL HIGH (ref 1.7–7.7)
Neutrophils Relative %: 83 %
Platelets: 346 10*3/uL (ref 150–400)
RBC: 4.31 MIL/uL (ref 4.22–5.81)
RDW: 14.6 % (ref 11.5–15.5)
WBC: 18.8 10*3/uL — ABNORMAL HIGH (ref 4.0–10.5)
nRBC: 0 % (ref 0.0–0.2)

## 2019-08-28 LAB — TROPONIN I (HIGH SENSITIVITY)
Troponin I (High Sensitivity): 7 ng/L (ref <18)
Troponin I (High Sensitivity): 7 ng/L (ref <18)

## 2019-08-28 LAB — LIPASE, BLOOD: Lipase: 20 U/L (ref 11–51)

## 2019-08-28 IMAGING — DX DG CHEST 1V PORT
1 series · 2 of 2 positions shown · non-contrast
Comparison: Chest radiograph dated [DATE]

CLINICAL DATA: 58-year-old male with abdominal pain and shortness
of breath. History of COPD.

EXAM:
PORTABLE CHEST 1 VIEW

[Series 1: chest · 0.14mm/px · 2 of 2 slices shown]
[im 1/2]
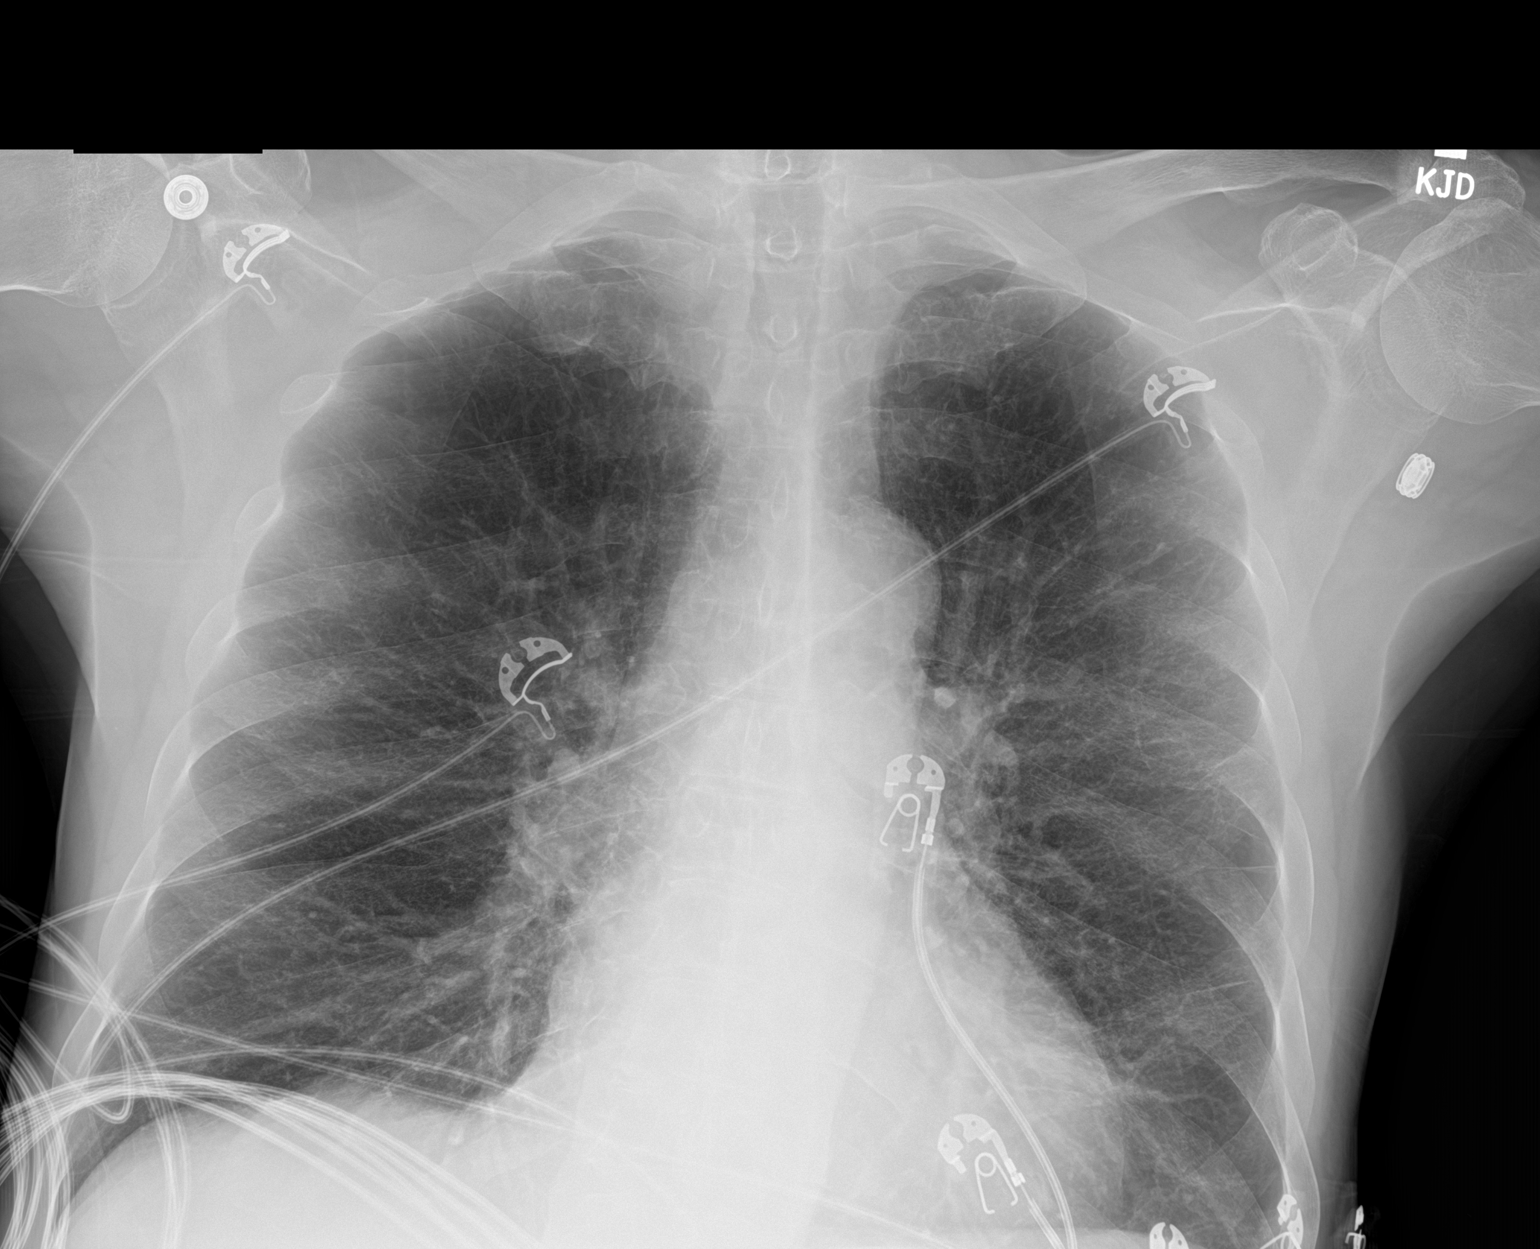
[im 2/2]
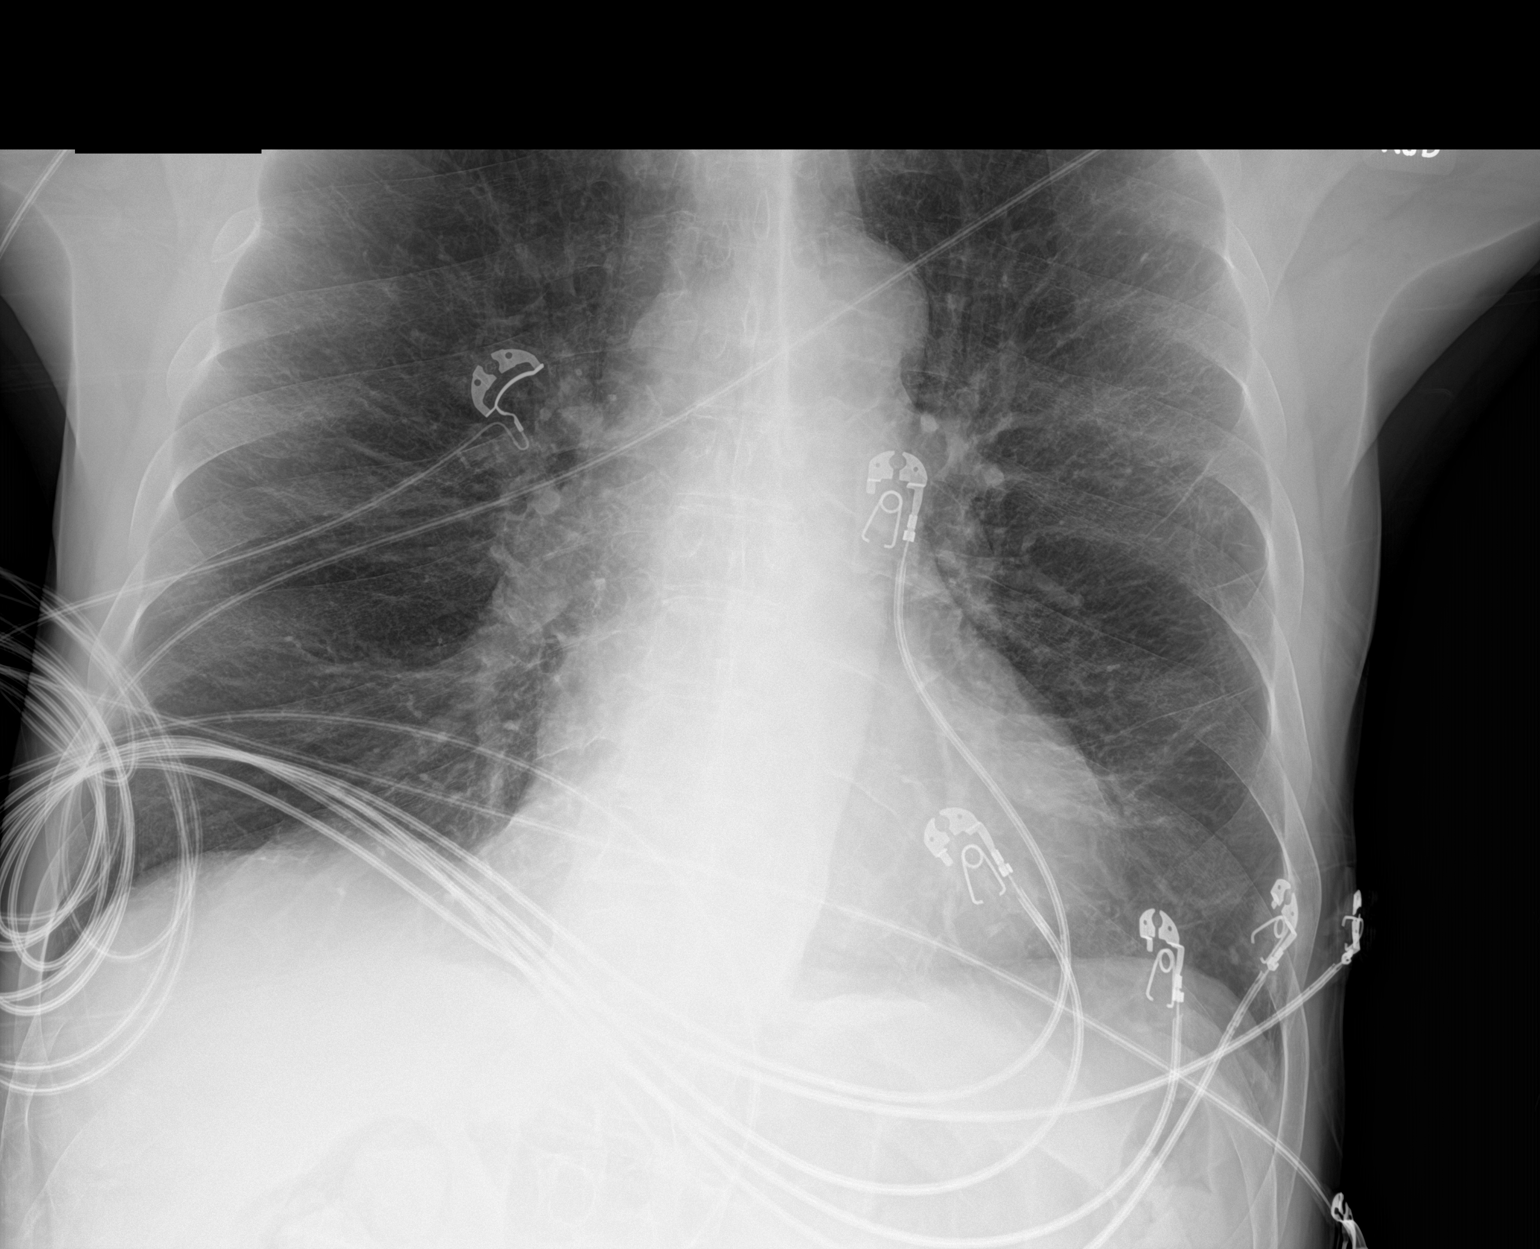

[2 of 2 positions shown; findings below may reference images not displayed]

FINDINGS: Emphysema. No focal consolidation, pleural effusion, or
pneumothorax. The cardiac silhouette is within normal limits. No
acute osseous pathology.
IMPRESSION: No active cardiopulmonary disease.

## 2019-08-28 MED ORDER — SODIUM CHLORIDE 0.9% FLUSH: 3.0000 mL | Freq: Once | INTRAVENOUS | Status: DC

## 2019-08-28 MED ORDER — BUDESONIDE 180 MCG/ACT IN AEPB
2.0000 | INHALATION_SPRAY | Freq: Two times a day (BID) | RESPIRATORY_TRACT | Status: DC
  Administered 2019-08-28: 2 via RESPIRATORY_TRACT
  Filled 2019-08-28: qty 1

## 2019-08-28 MED ORDER — DIAZEPAM 5 MG PO TABS
5.0000 mg | ORAL_TABLET | Freq: Once | ORAL | Status: AC
  Administered 2019-08-28: 5 mg via ORAL
  Filled 2019-08-28: qty 1

## 2019-08-28 MED ORDER — ALBUTEROL SULFATE HFA 108 (90 BASE) MCG/ACT IN AERS
2.0000 | INHALATION_SPRAY | RESPIRATORY_TRACT | Status: DC | PRN
  Filled 2019-08-28: qty 6.7

## 2019-08-28 NOTE — ED Notes (Signed)
Patient verbalizes understanding of discharge instructions. Opportunity for questioning and answers were provided. Armband removed by staff, pt discharged from ED.  

## 2019-08-28 NOTE — ED Provider Notes (Signed)
[facility] EMERGENCY DEPARTMENT Provider Note   CSN: [id number] Arrival date & time: 08/28/19  0444     History   Chief Complaint Chief Complaint  Patient presents with   Chest Tightness/SOB    HPI Tony Sullivan is a 58 y.o. male.     The history is provided by the patient and medical records. No language interpreter was used.   Tony Sullivan is a 58 y.o. male  with a PMH as listed below who presents to the Emergency Department complaining of abdominal discomfort. He reports this has been an ongoing for about a week.  He saw GI for same on 10/28.  He was also seen in the emergency department earlier today.  He tells me that nothing has changed with his symptomology, he just is concerned as the abdominal distention and bloating has not improved.  He does not understand why it is present.  Denies any nausea or vomiting.  No change in baseline oxygen requirement.  History of chronic back pain without acute worsening.  No fevers.  No changes in bowel habitus.  Tells me that the Valium he was given earlier today helped a great deal, but now pain has returned.  He does not notice any discomfort when laying down, but when he sits up, he immediately becomes bloated and feels tight.  Past Medical History:  Diagnosis Date   COPD (chronic obstructive pulmonary disease) ([place]) 2004   diagnosed in 2004, no PFT's to date.  Started on home O2 12/2013, after found to be desatting at PCP's office, and referred to pulmonology.   High blood pressure    Seasonal allergies    Tobacco dependence     Patient Active Problem List   Diagnosis Date Noted   Abdominal pain    Poor dentition 11/04/2017   Acute on chronic respiratory failure with hypoxia ([place]) 08/29/2017   ETOH abuse 03/30/2017   Cigarette smoker 03/30/2017   Acute respiratory failure with hypoxia and hypercapnia ([facility]) 03/29/2017   Special screening for malignant neoplasms, colon    Benign neoplasm of  ascending colon    Benign neoplasm of descending colon    Hoarseness 08/22/2016   GERD (gastroesophageal reflux disease) 08/22/2016   Atypical chest pain 12/05/2015   Essential hypertension 06/15/2014   Pectoralis muscle strain 01/11/2014   COPD with acute exacerbation ([place]) 01/10/2014   COPD  GOLD III 01/08/2014   Smoker 01/08/2014   Cough 01/08/2014   Chronic respiratory failure with hypoxia ([place]) 01/08/2014    Past Surgical History:  Procedure Laterality Date   APPENDECTOMY  1980   COLONOSCOPY WITH PROPOFOL N/A 12/10/2016   Procedure: COLONOSCOPY WITH PROPOFOL;  Surgeon: [name], MD;  Location: [facility];  Service: Endoscopy;  Laterality: N/A;   HIP SURGERY Right    SHOULDER ARTHROSCOPY Right         Home Medications    Prior to Admission medications   Medication Sig Start Date End Date Taking? Authorizing Provider  albuterol (PROAIR HFA) 108 (90 Base) MCG/ACT inhaler Inhale 1-2 puffs into the lungs every 4 (four) hours as needed. INHALE TWO PUFFS UP TO EVERY 4 HOURS IF NEEDED FOR SHORT OF BREATH 08/13/19   [name], MD  albuterol (PROVENTIL) (2.5 MG/3ML) 0.083% nebulizer solution Take 3 mLs (2.5 mg total) by nebulization every 4 (four) hours as needed for wheezing or shortness of breath. 04/28/18   [name], NP  amLODipine (NORVASC) 10 MG tablet Take 10 mg  by mouth daily.     [provider]  benztropine (COGENTIN) 0.5 MG tablet Take 0.5 mg by mouth at bedtime.     [provider]  budesonide-formoterol (SYMBICORT) 160-4.5 MCG/ACT inhaler INHALE TWO PUFFS BY MOUTH INTO THE LUNGS TWICE A DAY 08/12/19   [name], MD  cetirizine (ZYRTEC) 10 MG tablet Take 10 mg by mouth daily.    [provider]  docusate sodium (COLACE) 100 MG capsule Take 100 mg by mouth daily.     [provider]  famotidine (PEPCID) 20 MG tablet TAKE ONE TABLET BY MOUTH DAILY AT BEDTIME 02/18/19   [name], MD  fluticasone  [facility]) 50 MCG/ACT nasal spray Place 2 sprays into both nostrils 3 (three) times daily as needed for allergies.     [provider]  losartan (COZAAR) 100 MG tablet Take 100 mg by mouth daily.    [provider]  loxapine (LOXITANE) 10 MG capsule Take 10 mg by mouth at bedtime.     [provider]  mirtazapine (REMERON) 30 MG tablet Take 30 mg by mouth at bedtime.    [provider]  Multiple Vitamin (MULTIVITAMIN) capsule Take 1 capsule by mouth daily.    [provider]  nicotine (NICODERM CQ - DOSED IN MG/24 HOURS) 14 mg/24hr patch Place 1 patch (14 mg total) onto the skin daily. 10/03/18   [name], MD  OXYGEN Inhale 2 L into the lungs as directed. 2-3 liters with sleep and exertion as needed for low oxygen    [provider]  pantoprazole (PROTONIX) 40 MG tablet Take 1 tablet (40 mg total) by mouth daily. Take 30-60 min before first meal of the day 08/13/19   [name], MD  polyethylene glycol (MIRALAX) 17 g packet Take 17 g by mouth daily. 08/23/19   Fredia Sorrow, MD  predniSONE (DELTASONE) 10 MG tablet Take 2 tabs daily until better then remain on 1 tab daily. 08/13/19   [name], MD  Respiratory Therapy Supplies (FLUTTER) DEVI Use as directed 12/21/18   [name], MD  risperiDONE (RISPERDAL) 2 MG tablet Take 2 mg by mouth at bedtime.    [provider]  SPIRIVA RESPIMAT 2.5 MCG/ACT AERS INHALE TWO PUFFS BY MOUTH EVERY MORNING 07/19/19   [name], MD  tiotropium (SPIRIVA) 18 MCG inhalation capsule Place 1 capsule (18 mcg total) into inhaler and inhale daily. 07/30/19   [name], MD    Family History Family History  Problem Relation Age of Onset   Emphysema Mother    Allergies Mother        "everyone in family"   Asthma Mother        "everyone in family"   Heart disease Mother    Clotting disorder Mother    Cancer Mother    Emphysema Father    Allergies Father     Allergies Son    Heart disease Maternal Aunt     Social History Social History   Tobacco Use   Smoking status: Current Some Day Smoker    Packs/day: 2.00    Years: 36.00    Pack years: 72.00    Types: Cigarettes   Smokeless tobacco: Never Used  Substance Use Topics   Alcohol use: Yes    Alcohol/week: 14.0 - 21.0 standard drinks    Types: 14 - 21 Standard drinks or equivalent per week    Comment: 2-3 beers/day, with occasional "binges".  No history of withdrawal   Drug use: Yes    Types: Marijuana    Comment: daily last use 3-4 mo ago.     Allergies   Penicillins, Sulfa antibiotics, Famotidine, and Levocetirizine   Review of Systems Review of Systems  Respiratory: Positive for shortness of breath. Negative for cough and wheezing.   Gastrointestinal: Positive for abdominal distention.  All other systems reviewed and are negative.   Physical Exam Updated Vital Signs BP 134/90 (BP Location: Right Arm)    Pulse 80    Resp 16    SpO2 100%   Physical Exam Vitals signs and nursing note reviewed.  Constitutional:      General: He is not in acute distress.    Appearance: He is well-developed.  HENT:     Head: Normocephalic and atraumatic.  Neck:     Musculoskeletal: Neck supple.  Cardiovascular:     Rate and Rhythm: Normal rate and regular rhythm.     Heart sounds: Normal heart sounds. No murmur.  Pulmonary:     Effort: Pulmonary effort is normal. No respiratory distress.     Breath sounds: Normal breath sounds. No wheezing.  Abdominal:     Palpations: Abdomen is soft.     Comments: No abdominal distention or tenderness.  Skin:    General: Skin is warm and dry.  Neurological:     Mental Status: He is alert and oriented to person, place, and time.      ED Treatments / Results  Labs (all labs ordered are listed, but only abnormal results are displayed) Labs Reviewed  CBC - Abnormal; Notable for the following components:      Result Value   WBC 15.0 (*)     MCV 100.5 (*)    All other components within normal limits  BASIC METABOLIC PANEL  TROPONIN I (HIGH SENSITIVITY)  TROPONIN I (HIGH SENSITIVITY)    EKG EKG Interpretation  Date/Time:  Saturday August 28 2019 05:03:51 EDT Ventricular Rate:  82 PR Interval:  146 QRS Duration: 80 QT Interval:  358 QTC Calculation: 418 R Axis:   40 Text Interpretation: Normal sinus rhythm Right atrial enlargement Indeterminate axis Pulmonary disease pattern Abnormal ECG When compared with ECG of 08/27/2019, No significant change was found Confirmed by Delora Fuel ([id number]) on 08/28/2019 5:55:45 AM   Radiology Mr Cervical Spine W Wo Contrast  Result Date: 08/26/2019 CLINICAL DATA:  Compression fracture of L1, L4, and L5 in the last 3-4 months increased sensation of bloating and distension EXAM: MRI CERVICAL SPINE WITHOUT AND WITH CONTRAST TECHNIQUE: Multiplanar and multiecho pulse sequences of the cervical spine, to include the craniocervical junction and cervicothoracic junction, were obtained without and with intravenous contrast. CONTRAST:  29mL GADAVIST GADOBUTROL 1 MMOL/ML IV SOLN COMPARISON:  None. FINDINGS: Alignment: There is slight reversal of the normal cervical lordosis. High suspected to Vertebrae: The vertebral body heights are well maintained. No fracture, marrow edema,or pathologic marrow infiltration. Fatty endplate changes are seen at C6-C7, and C7-T1. Disc height loss with anterior osteophytes are also seen predominantly at C6-C7 and C7-T1. No areas of abnormal enhancement are seen to signify pathologic marrow infiltration. Cord: Increased T2 cord signal is seen at the C5-C6 which is seen diffusely, likely cord edema. No abnormal leptomeningeal enhancement is seen. Posterior Fossa, vertebral arteries, paraspinal tissues: The visualized portion of the posterior fossa is unremarkable. Normal flow voids seen within the vertebral arteries. There is mild edema within the supraspinous soft tissues  from C5 through C7.  Disc levels: C1-C2: Atlanto-axial junction is normal, without canal narrowing C2-C3: Minimal disc osteophyte complex is seen, however no significant canal or neural foraminal narrowing. C3-C4: Disc osteophyte complex and uncovertebral osteophytes are seen which causes mild bilateral neural foraminal narrowing. C4-C5: Disc osteophyte complex and uncovertebral osteophytes are seen which causes mild bilateral neural foraminal narrowing. C5-C6: Disc osteophyte complex is seen with a left paracentral disc protrusion measuring approximately 5 mm in AP dimension. The disc protrusion contacts the exiting left C6 nerve root. There is moderate bilateral neural foraminal narrowing. The central thecal sac measures 4 mm in AP diameter and there is increased STIR signal seen diffusely through the cord. C6-C7: There is a minimal disc osteophyte complex with disc degeneration which causes severe bilateral neural foraminal narrowing. C7-T1: There is disc height loss with degeneration and disc osteophyte complex which causes severe bilateral neural foraminal narrowing. IMPRESSION: 1. Cervical spine spondylosis most notable C5-C6 with central cord edema from a left paracentral disc protrusion which contacts the exiting left C6 nerve root. There is also moderate bilateral neural foraminal narrowing and moderate to severe central canal stenosis. 2. No evidence of pathologic marrow infiltration. These results were called by telephone at the time of interpretation on 08/26/2019 at 3:02 pm to provider Kary Kos , who verbally acknowledged these results. Electronically Signed   By: Prudencio Pair M.D.   On: 08/26/2019 15:13   Mr Lumbar Spine W Wo Contrast  Result Date: 08/26/2019 CLINICAL DATA:  Prior compression fractures in the last several months, increased bloating and pain EXAM: MRI LUMBAR SPINE WITHOUT AND WITH CONTRAST TECHNIQUE: Multiplanar and multiecho pulse sequences of the lumbar spine were obtained  without and with intravenous contrast. CONTRAST:  1mL GADAVIST GADOBUTROL 1 MMOL/ML IV SOLN COMPARISON:  Radiograph August 02, 2019, CT August 06, 2019 FINDINGS: Segmentation: There are 5 non-rib bearing lumbar type vertebral bodies with the last intervertebral disc space labeled as L5-S1. Alignment:  Normal Vertebrae: There is a chronic compression deformity of the L1 vertebral body with approximately 50% loss in vertebral body height. Mild surrounding marrow edema seen. There is also a new compression deformity of the L2 vertebral body with approximately 25% loss in vertebral body height and marrow edema in the inferior endplate. There is buckling of the posterior cortex without retropulsion of fragments. Again noted is a chronic compression/burst deformity of the L4 vertebral body with 50% loss in vertebral body height. There is buckling of the anterior superior cortex. There is mild surrounding marrow edema. No retropulsion of fragments however is noted. No areas of abnormal enhancement are seen to signify pathologic marrow infiltration. Conus medullaris and cauda equina: Conus extends to the L1 level. Conus and cauda equina appear normal. Paraspinal and other soft tissues: The paraspinal soft tissues and visualized retroperitoneal structures are unremarkable. The sacroiliac joints are intact. Disc levels: T12-L1:  No significant canal or neural foraminal narrowing. L1-L2: Facet arthrosis is seen which causes mild bilateral neural foraminal narrowing. L2-L3: There is buckling of the posterior cortex with a broad-based disc bulge and facet arthrosis which causes moderate bilateral neural foraminal narrowing. The central thecal sac is mildly effaced. L3-L4: There is a broad-based disc bulge with facet arthrosis which causes moderate bilateral neural foraminal narrowing. L4-L5: There is a broad-based disc bulge and ligamentum flavum hypertrophy which causes severe bilateral neural foraminal narrowing. The central  thecal sac is mildly effaced measuring 7 mm in AP diameter. L5-S1: There is a broad-based disc bulge with a central disc protrusion  which contacts the bilateral descending S1 nerve roots. There is severe bilateral neural foraminal narrowing. There is mild effacement anterior thecal sac. IMPRESSION: 1. Since the prior CT August 06, 2019 new compression deformity of the L2 vertebral body with less than 25% loss in height and surrounding marrow edema. No retropulsion of fragments. 2. Unchanged compression deformities of L1 and L4 with 50% loss in vertebral body height and slight buckling of the posterior cortex. 3. Lumbar spine spondylosis most notable at L4-L5 with severe bilateral neural foraminal narrowing and mild central canal stenosis and at L5-S1 with a small central disc protrusion contacting the bilateral descending S1 nerve roots with severe bilateral neural foraminal narrowing. 4. No evidence of pathologic marrow infiltration. These results were called by telephone at the time of interpretation on 08/26/2019 at 3:11 pm to provider Kary Kos , who verbally acknowledged these results. Electronically Signed   By: Prudencio Pair M.D.   On: 08/26/2019 15:12   Dg Chest Port 1 View  Result Date: 08/28/2019 CLINICAL DATA:  58 year old male with abdominal pain and shortness of breath. History of COPD. EXAM: PORTABLE CHEST 1 VIEW COMPARISON:  Chest radiograph dated 08/06/2019 FINDINGS: Emphysema. No focal consolidation, pleural effusion, or pneumothorax. The cardiac silhouette is within normal limits. No acute osseous pathology. IMPRESSION: No active cardiopulmonary disease. Electronically Signed   By: Anner Crete M.D.   On: 08/28/2019 01:01    Procedures Procedures (including critical care time)  Medications Ordered in ED Medications  sodium chloride flush (NS) 0.9 % injection 3 mL (has no administration in time range)  budesonide (PULMICORT) 180 MCG/ACT inhaler 2 puff (has no administration in time  range)  diazepam (VALIUM) tablet 5 mg (has no administration in time range)     Initial Impression / Assessment and Plan / ED Course  I have reviewed the triage vital signs and the nursing notes.  Pertinent labs & imaging results that were available during my care of the patient were reviewed by me and considered in my medical decision making (see chart for details).       Tony Sullivan is a 58 y.o. male who presents to ED for persistent abdominal discomfort and reported abdominal distention.  Seen by GI for same on 10/28 where he had reassuring examination.  Per GI, "I continue to believe his bloating and distention is more of a sensation secondary to severe COPD and now also with multiple compression fractures."  He was then seen yesterday/early this morning in the emergency department for same with reassuring work-up.  On my examination, he is hemodynamically stable with no abdominal distention.  Labs obtained in triage are all reassuring. Evaluation does not show pathology that would require ongoing emergent intervention or inpatient treatment.  Reassurance provided.  Discussed importance of following up with his multiple specialists and primary doctor for continued management of his chronic conditions as well as discussion of complaint today.  Return precautions discussed and all questions answered.  Final Clinical Impressions(s) / ED Diagnoses   Final diagnoses:  Abdominal discomfort    ED Discharge Orders    None       Penelopi Mikrut, Ozella Almond, PA-C 08/28/19 1024    Pattricia Boss, MD 08/28/19 [phone]

## 2019-08-28 NOTE — ED Triage Notes (Addendum)
Patient reports chest tightness with SOB onset this morning , seen here last night - discharged home . Denies cough , no fever or chills . Chest X-ray ( portable) done last night .

## 2019-08-28 NOTE — ED Notes (Signed)
Upon discharge pt states he now requires oxygen 24/7 and asks to use hospital O2 tank. Reminded pt that once discharged he would have to switch to his own personal tank once leaving the building. Patient's personal tank was 1/4th full. Patient wheeled himself out on his power wheelchair with hospital oxygen attached.

## 2019-08-28 NOTE — Discharge Instructions (Signed)
Call your GI doctor to schedule a follow up appointment.   Return to ER for new or worsening symptoms, any additional concerns.

## 2019-08-28 NOTE — ED Notes (Signed)
Patient states he is going to call news 2 to report being left in room for over two hours. Patient asks this Probation officer can he call 911 and have them take him to another hospital because he felt like his discharge was unfair and was being treated like he was homeless. Explained that he had the right to seek care somewhere else but he was being discharged from hospital and had to be out the room and outside before he called. Patient states he is going to another hospital after being discharged from here. RN notified.

## 2019-08-28 NOTE — ED Notes (Signed)
Bladder scan=147mL

## 2019-08-28 NOTE — ED Notes (Signed)
Pt states he wants to call 911 so they can take him to [facility]. Pt states he is going to "call the news because nobody here wants to do their job". On discharge pt now stating he wears home O2 at all times. Pt states he has someone who can bring him O2 and for [place] "not to worry about it". Pt left in no apparent distress.

## 2019-08-28 NOTE — ED Provider Notes (Signed)
[facility] EMERGENCY DEPARTMENT Provider Note  CSN: [id number] Arrival date & time: 08/27/19 2203  Chief Complaint(s) Abdominal Pain and Shortness of Breath  HPI Tony Sullivan is a 58 y.o. male with a past medical history listed below including COPD on 2 to 3 L nasal cannula at home, history of recent lumbar fractures who presents to the emergency department with 1 month of intermittent abdominal cramping and bloating causing him pain in his abdominal muscles and difficulty breathing.  Patient was actually evaluated in the emergency department on 10/26 for the same.  At that time acute abdominal series did not reveal evidence of significant constipation.  However he was placed on MiraLAX given his narcotic pain regimen.  In addition patient was evaluated in the emergency department on 10/09 for the same and had a CT scan which was unremarkable.  Patient denies any fevers or chills.  No cough or congestion.  No chest pain.  No other physical complaints.    HPI  Past Medical History Past Medical History:  Diagnosis Date  . COPD (chronic obstructive pulmonary disease) ([place]) 2004   diagnosed in 2004, no PFT's to date.  Started on home O2 12/2013, after found to be desatting at PCP's office, and referred to pulmonology.  . High blood pressure   . Seasonal allergies   . Tobacco dependence    Patient Active Problem List   Diagnosis Date Noted  . Abdominal pain   . Poor dentition 11/04/2017  . Acute on chronic respiratory failure with hypoxia ([place]) 08/29/2017  . ETOH abuse 03/30/2017  . Cigarette smoker 03/30/2017  . Acute respiratory failure with hypoxia and hypercapnia ([place]) 03/29/2017  . Special screening for malignant neoplasms, colon   . Benign neoplasm of ascending colon   . Benign neoplasm of descending colon   . Hoarseness 08/22/2016  . GERD (gastroesophageal reflux disease) 08/22/2016  . Atypical chest pain 12/05/2015  . Essential hypertension 06/15/2014   . Pectoralis muscle strain 01/11/2014  . COPD with acute exacerbation ([place]) 01/10/2014  . COPD  GOLD III 01/08/2014  . Smoker 01/08/2014  . Cough 01/08/2014  . Chronic respiratory failure with hypoxia ([place]) 01/08/2014   Home Medication(s) Prior to Admission medications   Medication Sig Start Date End Date Taking? Authorizing Provider  albuterol (PROAIR HFA) 108 (90 Base) MCG/ACT inhaler Inhale 1-2 puffs into the lungs every 4 (four) hours as needed. INHALE TWO PUFFS UP TO EVERY 4 HOURS IF NEEDED FOR SHORT OF BREATH 08/13/19   [name], MD  albuterol (PROVENTIL) (2.5 MG/3ML) 0.083% nebulizer solution Take 3 mLs (2.5 mg total) by nebulization every 4 (four) hours as needed for wheezing or shortness of breath. 04/28/18   [name], NP  amLODipine (NORVASC) 10 MG tablet Take 10 mg by mouth daily.     [provider]  benztropine (COGENTIN) 0.5 MG tablet Take 0.5 mg by mouth at bedtime.     [provider]  budesonide-formoterol (SYMBICORT) 160-4.5 MCG/ACT inhaler INHALE TWO PUFFS BY MOUTH INTO THE LUNGS TWICE A DAY 08/12/19   [name], MD  cetirizine (ZYRTEC) 10 MG tablet Take 10 mg by mouth daily.    [provider]  docusate sodium (COLACE) 100 MG capsule Take 100 mg by mouth daily.     [provider]  famotidine (PEPCID) 20 MG tablet TAKE ONE TABLET BY MOUTH DAILY AT BEDTIME 02/18/19   [name], MD  fluticasone (FLONASE) 50 MCG/ACT nasal spray Place 2  sprays into both nostrils 3 (three) times daily as needed for allergies.     [provider]  losartan (COZAAR) 100 MG tablet Take 100 mg by mouth daily.    [provider]  loxapine (LOXITANE) 10 MG capsule Take 10 mg by mouth at bedtime.     [provider]  mirtazapine (REMERON) 30 MG tablet Take 30 mg by mouth at bedtime.    [provider]  Multiple Vitamin (MULTIVITAMIN) capsule Take 1 capsule by mouth daily.    [provider]   nicotine (NICODERM CQ - DOSED IN MG/24 HOURS) 14 mg/24hr patch Place 1 patch (14 mg total) onto the skin daily. 10/03/18   [name], MD  OXYGEN Inhale 2 L into the lungs as directed. 2-3 liters with sleep and exertion as needed for low oxygen    [provider]  pantoprazole (PROTONIX) 40 MG tablet Take 1 tablet (40 mg total) by mouth daily. Take 30-60 min before first meal of the day 08/13/19   [name], MD  polyethylene glycol (MIRALAX) 17 g packet Take 17 g by mouth daily. 08/23/19   Fredia Sorrow, MD  predniSONE (DELTASONE) 10 MG tablet Take 2 tabs daily until better then remain on 1 tab daily. 08/13/19   [name], MD  Respiratory Therapy Supplies (FLUTTER) DEVI Use as directed 12/21/18   [name], MD  risperiDONE (RISPERDAL) 2 MG tablet Take 2 mg by mouth at bedtime.    [provider]  SPIRIVA RESPIMAT 2.5 MCG/ACT AERS INHALE TWO PUFFS BY MOUTH EVERY MORNING 07/19/19   [name], MD  tiotropium (SPIRIVA) 18 MCG inhalation capsule Place 1 capsule (18 mcg total) into inhaler and inhale daily. 07/30/19   [name], MD                                                                                                                                    Past Surgical History Past Surgical History:  Procedure Laterality Date  . APPENDECTOMY  1980  . COLONOSCOPY WITH PROPOFOL N/A 12/10/2016   Procedure: COLONOSCOPY WITH PROPOFOL;  Surgeon: [name], MD;  Location: [facility];  Service: Endoscopy;  Laterality: N/A;  . HIP SURGERY Right   . SHOULDER ARTHROSCOPY Right    Family History Family History  Problem Relation Age of Onset  . Emphysema Mother   . Allergies Mother        "everyone in family"  . Asthma Mother        "everyone in family"  . Heart disease Mother   . Clotting disorder Mother   . Cancer Mother   . Emphysema Father   . Allergies Father   . Allergies Son   . Heart disease Maternal Aunt     Social History  Social History   Tobacco Use  . Smoking status: Current Some Day Smoker    Packs/day:  2.00    Years: 36.00    Pack years: 72.00    Types: Cigarettes  . Smokeless tobacco: Never Used  Substance Use Topics  . Alcohol use: Yes    Alcohol/week: 14.0 - 21.0 standard drinks    Types: 14 - 21 Standard drinks or equivalent per week    Comment: 2-3 beers/day, with occasional "binges".  No history of withdrawal  . Drug use: Yes    Types: Marijuana    Comment: daily last use 3-4 mo ago.   Allergies Penicillins, Sulfa antibiotics, Famotidine, and Levocetirizine  Review of Systems Review of Systems All other systems are reviewed and are negative for acute change except as noted in the HPI  Physical Exam Vital Signs  I have reviewed the triage vital signs BP (!) 144/98 (BP Location: Right Arm)   Pulse 76   Resp (!) 21   SpO2 98%   Physical Exam Vitals signs reviewed.  Constitutional:      General: He is not in acute distress.    Appearance: He is well-developed. He is not diaphoretic.  HENT:     Head: Normocephalic and atraumatic.     Nose: Nose normal.  Eyes:     General: No scleral icterus.       Right eye: No discharge.        Left eye: No discharge.     Conjunctiva/sclera: Conjunctivae normal.     Pupils: Pupils are equal, round, and reactive to light.  Neck:     Musculoskeletal: Normal range of motion and neck supple.  Cardiovascular:     Rate and Rhythm: Normal rate and regular rhythm.     Heart sounds: No murmur. No friction rub. No gallop.   Pulmonary:     Effort: Pulmonary effort is normal. No respiratory distress.     Breath sounds: Normal breath sounds. No stridor. No rales.  Abdominal:     General: There is no distension.     Palpations: Abdomen is soft.     Tenderness: There is no abdominal tenderness (when distracted).  Musculoskeletal:        General: No tenderness.  Skin:    General: Skin is warm and dry.     Findings: No erythema or rash.   Neurological:     Mental Status: He is alert and oriented to person, place, and time.     ED Results and Treatments Labs (all labs ordered are listed, but only abnormal results are displayed) Labs Reviewed  CBC WITH DIFFERENTIAL/PLATELET - Abnormal; Notable for the following components:      Result Value   WBC 18.8 (*)    Neutro Abs 15.5 (*)    Monocytes Absolute 1.4 (*)    Abs Immature Granulocytes 0.26 (*)    All other components within normal limits  COMPREHENSIVE METABOLIC PANEL - Abnormal; Notable for the following components:   Albumin 3.4 (*)    Alkaline Phosphatase 149 (*)    All other components within normal limits  LIPASE, BLOOD  EKG  EKG Interpretation  Date/Time:  Friday August 27 2019 22:20:11 EDT Ventricular Rate:  105 PR Interval:    QRS Duration: 84 QT Interval:  343 QTC Calculation: 454 R Axis:   -110 Text Interpretation: Normal sinus rhythm Left anterior fascicular block motion artifact Confirmed by Addison Lank [phone]) on 08/27/2019 11:05:14 PM      Radiology Dg Chest Port 1 View  Result Date: 08/28/2019 CLINICAL DATA:  58 year old male with abdominal pain and shortness of breath. History of COPD. EXAM: PORTABLE CHEST 1 VIEW COMPARISON:  Chest radiograph dated 08/06/2019 FINDINGS: Emphysema. No focal consolidation, pleural effusion, or pneumothorax. The cardiac silhouette is within normal limits. No acute osseous pathology. IMPRESSION: No active cardiopulmonary disease. Electronically Signed   By: Anner Crete M.D.   On: 08/28/2019 01:01    Pertinent labs & imaging results that were available during my care of the patient were reviewed by me and considered in my medical decision making (see chart for details).  Medications Ordered in ED Medications  diazepam (VALIUM) injection 5 mg (5 mg Intravenous Given 08/27/19 2358)                                                                                                                                     Procedures Procedures  (including critical care time)  Medical Decision Making / ED Course I have reviewed the nursing notes for this encounter and the patient's prior records (if available in EHR or on provided paperwork).   Tony Sullivan was evaluated in Emergency Department on 08/28/2019 for the symptoms described in the history of present illness. He was evaluated in the context of the global COVID-19 pandemic, which necessitated consideration that the patient might be at risk for infection with the SARS-CoV-2 virus that causes COVID-19. Institutional protocols and algorithms that pertain to the evaluation of patients at risk for COVID-19 are in a state of rapid change based on information released by regulatory bodies including the CDC and federal and state organizations. These policies and algorithms were followed during the patient's care in the ED.  Patient presents with abdominal wall pain causing shortness of breath.  Patient is satting well on his 3 L nasal cannula.  No evidence of respiratory distress.  Lung sounds clear to auscultation bilaterally with good air movement throughout.  Not suspicious for COPD exacerbation. Chest x-ray negative  Abdomen is benign.  Screening labs notable for leukocytosis.  Patient is afebrile and denied any infectious symptoms.  On review of records, patient was started on prednisone on 10/16 by pulmonologist for his COPD.  This is likely the cause of his leukocytosis.  Rest of the labs are grossly reassuring.  Patient provided with a single dose of Valium.   6045 am I evaluated the patient and noted that he was sleeping comfortably in bed.  Abdomen was soft patient did not respond to palpation.  I awoke the patient  he immediately complained of abdominal tightness and requested additional medication.  Fortunately given his  behavior and recent repeated visits for the same, I have a suspicion for secondary gain.  The patient appears reasonably screened and/or stabilized for discharge and I doubt any other medical condition or other [facility] requiring further screening, evaluation, or treatment in the ED at this time prior to discharge.  The patient is safe for discharge with strict return precautions.       Final Clinical Impression(s) / ED Diagnoses Final diagnoses:  Abdominal bloating  SOB (shortness of breath)    The patient appears reasonably screened and/or stabilized for discharge and I doubt any other medical condition or other [facility] requiring further screening, evaluation, or treatment in the ED at this time prior to discharge.  Disposition: Discharge  Condition: Good  I have discussed the results, Dx and Tx plan with the patient who expressed understanding and agree(s) with the plan. Discharge instructions discussed at great length. The patient was given strict return precautions who verbalized understanding of the instructions. No further questions at time of discharge.    ED Discharge Orders    None        Follow Up: [name], [place] [address] [phone]  Schedule an appointment as soon as possible for a visit  As needed, If symptoms do not improve or  worsen      This chart was dictated using voice recognition software.  Despite best efforts to proofread,  errors can occur which can change the documentation meaning.   Fatima Blank, MD 08/28/19 [phone]

## 2019-09-02 ENCOUNTER — Other Ambulatory Visit: Payer: Self-pay | Admitting: Neurosurgery

## 2019-09-03 ENCOUNTER — Encounter (HOSPITAL_COMMUNITY): Payer: Self-pay | Admitting: *Deleted

## 2019-09-03 ENCOUNTER — Other Ambulatory Visit (HOSPITAL_COMMUNITY)
Admission: RE | Admit: 2019-09-03 | Discharge: 2019-09-03 | Disposition: A | Discharge status: Home / Self Care | Payer: Medicaid Other | Source: Ambulatory Visit | Attending: Neurosurgery | Admitting: Neurosurgery

## 2019-09-03 ENCOUNTER — Other Ambulatory Visit: Payer: Self-pay

## 2019-09-03 DIAGNOSIS — Z20828 Contact with and (suspected) exposure to other viral communicable diseases: Secondary | ICD-10-CM | POA: Insufficient documentation

## 2019-09-03 DIAGNOSIS — Z01812 Encounter for preprocedural laboratory examination: Secondary | ICD-10-CM | POA: Diagnosis present

## 2019-09-03 LAB — SARS CORONAVIRUS 2 (TAT 6-24 HRS): SARS Coronavirus 2: NEGATIVE

## 2019-09-03 NOTE — Progress Notes (Signed)
Spoke with pt for pre-op call. Pt denies cardiac history and diabetes.   Pt had Covid test done today, he voiced understanding of need to be in quarantine until day of surgery.  Pt instructed not to smoke 24 hours prior to surgery.  Pt informed of Visitation policy and voiced understanding.

## 2019-09-06 ENCOUNTER — Inpatient Hospital Stay (HOSPITAL_COMMUNITY): Payer: Medicaid Other

## 2019-09-06 ENCOUNTER — Other Ambulatory Visit: Payer: Self-pay

## 2019-09-06 ENCOUNTER — Inpatient Hospital Stay (HOSPITAL_COMMUNITY)
Admission: RE | Admit: 2019-09-06 | Discharge: 2019-09-08 | DRG: 472 | Disposition: A | Discharge status: Home Health | Payer: Medicaid Other | Attending: Neurosurgery | Admitting: Neurosurgery

## 2019-09-06 ENCOUNTER — Inpatient Hospital Stay (HOSPITAL_COMMUNITY): Payer: Medicaid Other | Admitting: Anesthesiology

## 2019-09-06 ENCOUNTER — Encounter (HOSPITAL_COMMUNITY)
Admission: RE | Disposition: A | Discharge status: Home Health | Payer: Self-pay | Source: Home / Self Care | Attending: Neurosurgery

## 2019-09-06 ENCOUNTER — Encounter (HOSPITAL_COMMUNITY): Payer: Self-pay

## 2019-09-06 DIAGNOSIS — I10 Essential (primary) hypertension: Secondary | ICD-10-CM | POA: Diagnosis present

## 2019-09-06 DIAGNOSIS — M50022 Cervical disc disorder at C5-C6 level with myelopathy: Secondary | ICD-10-CM | POA: Diagnosis present

## 2019-09-06 DIAGNOSIS — Z79899 Other long term (current) drug therapy: Secondary | ICD-10-CM

## 2019-09-06 DIAGNOSIS — Z880 Allergy status to penicillin: Secondary | ICD-10-CM

## 2019-09-06 DIAGNOSIS — Z7951 Long term (current) use of inhaled steroids: Secondary | ICD-10-CM

## 2019-09-06 DIAGNOSIS — M1990 Unspecified osteoarthritis, unspecified site: Secondary | ICD-10-CM | POA: Diagnosis present

## 2019-09-06 DIAGNOSIS — Z7952 Long term (current) use of systemic steroids: Secondary | ICD-10-CM

## 2019-09-06 DIAGNOSIS — Z8249 Family history of ischemic heart disease and other diseases of the circulatory system: Secondary | ICD-10-CM

## 2019-09-06 DIAGNOSIS — K219 Gastro-esophageal reflux disease without esophagitis: Secondary | ICD-10-CM | POA: Diagnosis present

## 2019-09-06 DIAGNOSIS — Z888 Allergy status to other drugs, medicaments and biological substances status: Secondary | ICD-10-CM | POA: Diagnosis not present

## 2019-09-06 DIAGNOSIS — F17210 Nicotine dependence, cigarettes, uncomplicated: Secondary | ICD-10-CM | POA: Diagnosis present

## 2019-09-06 DIAGNOSIS — J449 Chronic obstructive pulmonary disease, unspecified: Secondary | ICD-10-CM | POA: Diagnosis present

## 2019-09-06 DIAGNOSIS — Z825 Family history of asthma and other chronic lower respiratory diseases: Secondary | ICD-10-CM | POA: Diagnosis not present

## 2019-09-06 DIAGNOSIS — Z882 Allergy status to sulfonamides status: Secondary | ICD-10-CM

## 2019-09-06 DIAGNOSIS — Z809 Family history of malignant neoplasm, unspecified: Secondary | ICD-10-CM

## 2019-09-06 DIAGNOSIS — Z832 Family history of diseases of the blood and blood-forming organs and certain disorders involving the immune mechanism: Secondary | ICD-10-CM | POA: Diagnosis not present

## 2019-09-06 DIAGNOSIS — M5020 Other cervical disc displacement, unspecified cervical region: Secondary | ICD-10-CM | POA: Diagnosis present

## 2019-09-06 DIAGNOSIS — M4712 Other spondylosis with myelopathy, cervical region: Secondary | ICD-10-CM | POA: Diagnosis present

## 2019-09-06 DIAGNOSIS — Z419 Encounter for procedure for purposes other than remedying health state, unspecified: Secondary | ICD-10-CM

## 2019-09-06 HISTORY — DX: Unspecified osteoarthritis, unspecified site: M19.90

## 2019-09-06 HISTORY — DX: Depression, unspecified: F32.A

## 2019-09-06 HISTORY — DX: Gastro-esophageal reflux disease without esophagitis: K21.9

## 2019-09-06 HISTORY — PX: ANTERIOR CERVICAL DECOMP/DISCECTOMY FUSION: SHX1161

## 2019-09-06 HISTORY — PX: ANTERIOR CERVICAL DISCECTOMY: SHX1160

## 2019-09-06 IMAGING — RF DG C-ARM 1-60 MIN
1 series · 2 of 2 positions shown · non-contrast
Comparison: Cervical spine MR dated [DATE].

CLINICAL DATA: Anterior cervical discectomy and fusion at the C5-6
level.

EXAM:
CERVICAL SPINE - 2-3 VIEW; DG C-ARM 1-60 MIN

[Series 1: run · 2 of 2 slices shown]
[im 1/2]
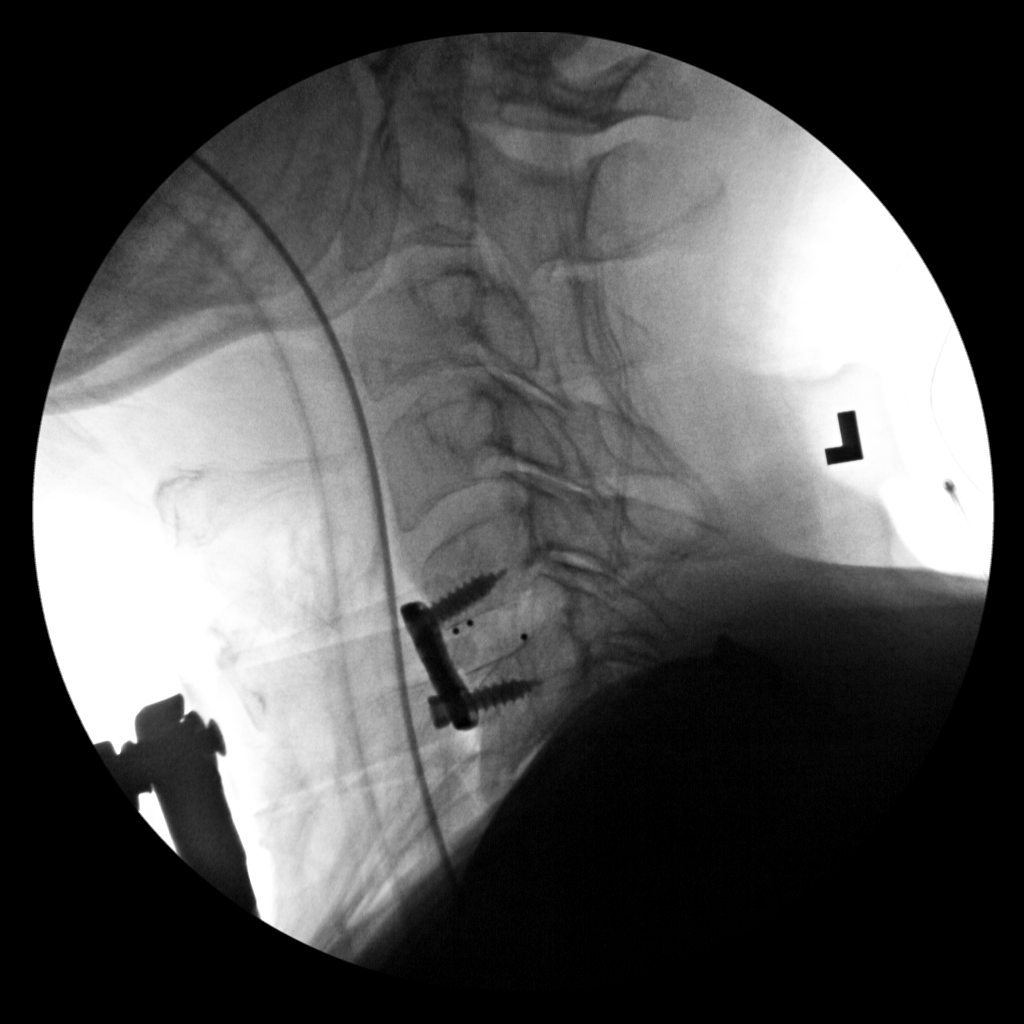
[im 2/2]
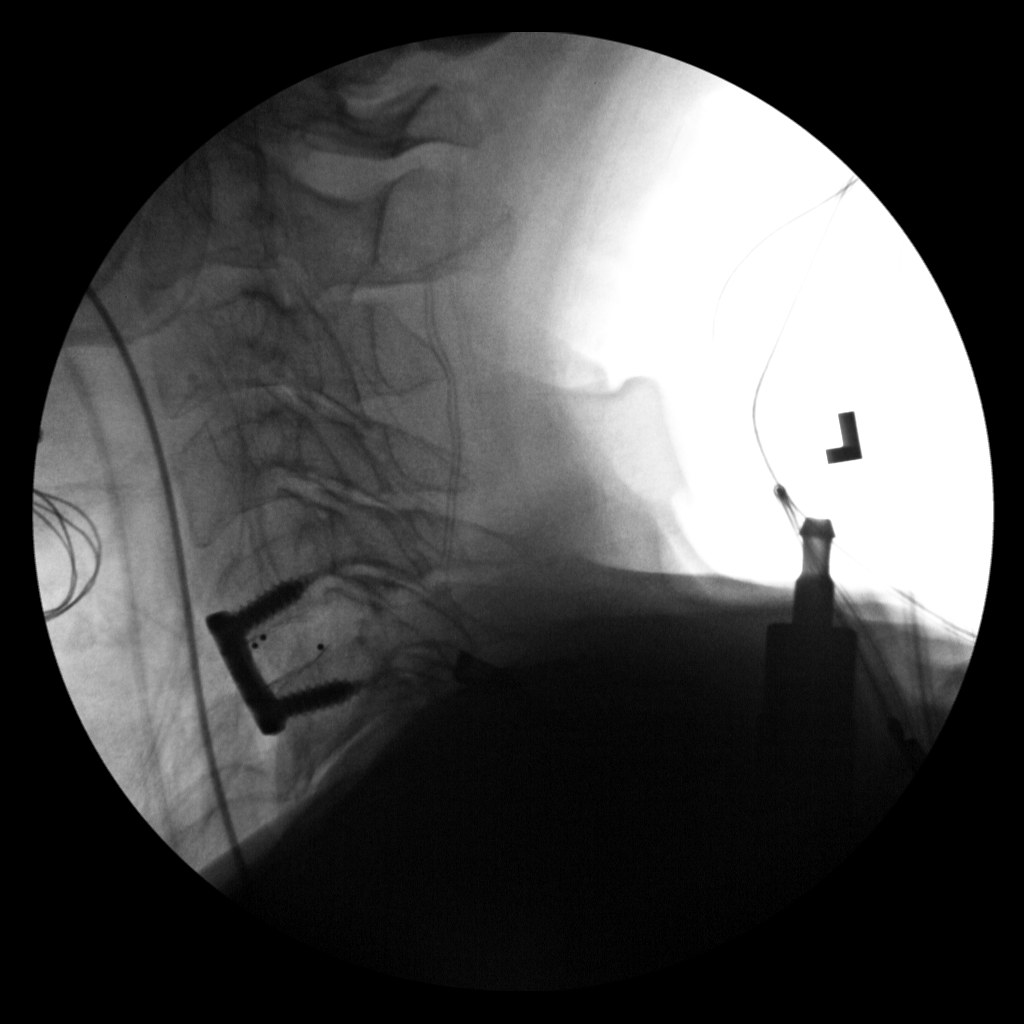

[2 of 2 positions shown; findings below may reference images not displayed]

FINDINGS: Interval interbody and anterior screw and plate fixation at the C5-6
level with normal alignment. Moderate anterior spur formation with
marked disc space narrowing is again demonstrated at the C6-7 level.
The C7 level and below are obscured by the patient's shoulders.
IMPRESSION: 1. Satisfactory postoperative appearance of the C5-6 ACDF.
2. Stable degenerative changes at the C6-7 level.

## 2019-09-06 SURGERY — ANTERIOR CERVICAL DECOMPRESSION/DISCECTOMY FUSION 1 LEVEL
Anesthesia: General

## 2019-09-06 MED ORDER — PROMETHAZINE HCL 25 MG/ML IJ SOLN: 6.2500 mg | INTRAMUSCULAR | Status: DC | PRN

## 2019-09-06 MED ORDER — MIDAZOLAM HCL 5 MG/5ML IJ SOLN
INTRAMUSCULAR | Status: DC | PRN
  Administered 2019-09-06 (×2): 1 mg via INTRAVENOUS

## 2019-09-06 MED ORDER — PROPOFOL 10 MG/ML IV BOLUS
INTRAVENOUS | Status: DC | PRN
  Administered 2019-09-06 (×3): 50 mg via INTRAVENOUS

## 2019-09-06 MED ORDER — PHENYLEPHRINE 40 MCG/ML (10ML) SYRINGE FOR IV PUSH (FOR BLOOD PRESSURE SUPPORT)
PREFILLED_SYRINGE | INTRAVENOUS | Status: AC
  Filled 2019-09-06: qty 20

## 2019-09-06 MED ORDER — ACETAMINOPHEN 325 MG PO TABS: 650.0000 mg | ORAL_TABLET | ORAL | Status: DC | PRN

## 2019-09-06 MED ORDER — LOXAPINE SUCCINATE 5 MG PO CAPS
10.0000 mg | ORAL_CAPSULE | Freq: Every day | ORAL | Status: DC
  Filled 2019-09-06 (×3): qty 2

## 2019-09-06 MED ORDER — DEXAMETHASONE SODIUM PHOSPHATE 10 MG/ML IJ SOLN
INTRAMUSCULAR | Status: DC | PRN
  Administered 2019-09-06: 10 mg via INTRAVENOUS

## 2019-09-06 MED ORDER — ALBUTEROL SULFATE (2.5 MG/3ML) 0.083% IN NEBU: 2.5000 mg | INHALATION_SOLUTION | RESPIRATORY_TRACT | Status: DC | PRN

## 2019-09-06 MED ORDER — SODIUM CHLORIDE 0.9% FLUSH: 3.0000 mL | INTRAVENOUS | Status: DC | PRN

## 2019-09-06 MED ORDER — LACTATED RINGERS IV SOLN
INTRAVENOUS | Status: DC | PRN
  Administered 2019-09-06: 09:00:00 via INTRAVENOUS

## 2019-09-06 MED ORDER — HYDROMORPHONE HCL 1 MG/ML IJ SOLN
0.2500 mg | INTRAMUSCULAR | Status: DC | PRN
  Administered 2019-09-06: 0.5 mg via INTRAVENOUS

## 2019-09-06 MED ORDER — ALBUTEROL SULFATE HFA 108 (90 BASE) MCG/ACT IN AERS
INHALATION_SPRAY | RESPIRATORY_TRACT | Status: DC | PRN
  Administered 2019-09-06: 2 via RESPIRATORY_TRACT

## 2019-09-06 MED ORDER — CYCLOBENZAPRINE HCL 10 MG PO TABS
ORAL_TABLET | ORAL | Status: AC
  Filled 2019-09-06: qty 1

## 2019-09-06 MED ORDER — ROCURONIUM BROMIDE 10 MG/ML (PF) SYRINGE
PREFILLED_SYRINGE | INTRAVENOUS | Status: DC | PRN
  Administered 2019-09-06: 50 mg via INTRAVENOUS

## 2019-09-06 MED ORDER — UMECLIDINIUM BROMIDE 62.5 MCG/INH IN AEPB
1.0000 | INHALATION_SPRAY | Freq: Every day | RESPIRATORY_TRACT | Status: DC
  Administered 2019-09-07 – 2019-09-08 (×2): 1 via RESPIRATORY_TRACT
  Filled 2019-09-06: qty 7

## 2019-09-06 MED ORDER — ACETAMINOPHEN 500 MG PO TABS
1000.0000 mg | ORAL_TABLET | Freq: Once | ORAL | Status: AC
  Administered 2019-09-06: 1000 mg via ORAL
  Filled 2019-09-06: qty 2

## 2019-09-06 MED ORDER — SODIUM CHLORIDE 0.9% FLUSH
3.0000 mL | Freq: Two times a day (BID) | INTRAVENOUS | Status: DC
  Administered 2019-09-06 – 2019-09-08 (×5): 3 mL via INTRAVENOUS

## 2019-09-06 MED ORDER — HYDROCODONE-ACETAMINOPHEN 10-325 MG PO TABS: 1.0000 | ORAL_TABLET | Freq: Four times a day (QID) | ORAL | Status: DC | PRN

## 2019-09-06 MED ORDER — THROMBIN 5000 UNITS EX SOLR
CUTANEOUS | Status: AC
  Filled 2019-09-06: qty 5000

## 2019-09-06 MED ORDER — THROMBIN 5000 UNITS EX SOLR
OROMUCOSAL | Status: DC | PRN
  Administered 2019-09-06: 5 mL via TOPICAL

## 2019-09-06 MED ORDER — ONDANSETRON HCL 4 MG/2ML IJ SOLN
INTRAMUSCULAR | Status: AC
  Filled 2019-09-06: qty 2

## 2019-09-06 MED ORDER — POLYETHYLENE GLYCOL 3350 17 G PO PACK
17.0000 g | PACK | Freq: Every day | ORAL | Status: DC
  Administered 2019-09-08: 17 g via ORAL
  Filled 2019-09-06 (×2): qty 1

## 2019-09-06 MED ORDER — PANTOPRAZOLE SODIUM 40 MG IV SOLR: 40.0000 mg | Freq: Every day | INTRAVENOUS | Status: DC

## 2019-09-06 MED ORDER — ACETAMINOPHEN 650 MG RE SUPP: 650.0000 mg | RECTAL | Status: DC | PRN

## 2019-09-06 MED ORDER — TIOTROPIUM BROMIDE MONOHYDRATE 18 MCG IN CAPS: 18.0000 ug | ORAL_CAPSULE | Freq: Every day | RESPIRATORY_TRACT | Status: DC

## 2019-09-06 MED ORDER — HYDROCODONE-ACETAMINOPHEN 5-325 MG PO TABS: 1.0000 | ORAL_TABLET | ORAL | Status: DC | PRN

## 2019-09-06 MED ORDER — PHENYLEPHRINE HCL-NACL 10-0.9 MG/250ML-% IV SOLN
INTRAVENOUS | Status: DC | PRN
  Administered 2019-09-06: 25 ug/min via INTRAVENOUS

## 2019-09-06 MED ORDER — FENTANYL CITRATE (PF) 250 MCG/5ML IJ SOLN
INTRAMUSCULAR | Status: AC
  Filled 2019-09-06: qty 5

## 2019-09-06 MED ORDER — ONDANSETRON HCL 4 MG/2ML IJ SOLN: 4.0000 mg | Freq: Four times a day (QID) | INTRAMUSCULAR | Status: DC | PRN

## 2019-09-06 MED ORDER — EPHEDRINE 5 MG/ML INJ
INTRAVENOUS | Status: AC
  Filled 2019-09-06: qty 20

## 2019-09-06 MED ORDER — MIDAZOLAM HCL 2 MG/2ML IJ SOLN
INTRAMUSCULAR | Status: AC
  Filled 2019-09-06: qty 2

## 2019-09-06 MED ORDER — LORATADINE 10 MG PO TABS
10.0000 mg | ORAL_TABLET | Freq: Every day | ORAL | Status: DC
  Administered 2019-09-07: 10 mg via ORAL
  Filled 2019-09-06 (×2): qty 1

## 2019-09-06 MED ORDER — ONDANSETRON HCL 4 MG PO TABS: 4.0000 mg | ORAL_TABLET | Freq: Four times a day (QID) | ORAL | Status: DC | PRN

## 2019-09-06 MED ORDER — OXYCODONE HCL 5 MG/5ML PO SOLN: 5.0000 mg | Freq: Once | ORAL | Status: DC | PRN

## 2019-09-06 MED ORDER — PREDNISONE 5 MG PO TABS: 10.0000 mg | ORAL_TABLET | Freq: Every day | ORAL | Status: DC

## 2019-09-06 MED ORDER — MOMETASONE FURO-FORMOTEROL FUM 200-5 MCG/ACT IN AERO
2.0000 | INHALATION_SPRAY | Freq: Two times a day (BID) | RESPIRATORY_TRACT | Status: DC
  Filled 2019-09-06: qty 8.8

## 2019-09-06 MED ORDER — MENTHOL 3 MG MT LOZG
1.0000 | LOZENGE | OROMUCOSAL | Status: DC | PRN
  Administered 2019-09-08: 3 mg via ORAL
  Filled 2019-09-06 (×2): qty 9

## 2019-09-06 MED ORDER — ALUM & MAG HYDROXIDE-SIMETH 200-200-20 MG/5ML PO SUSP: 30.0000 mL | Freq: Four times a day (QID) | ORAL | Status: DC | PRN

## 2019-09-06 MED ORDER — DOCUSATE SODIUM 100 MG PO CAPS
100.0000 mg | ORAL_CAPSULE | Freq: Every day | ORAL | Status: DC
  Administered 2019-09-06 – 2019-09-08 (×3): 100 mg via ORAL
  Filled 2019-09-06 (×3): qty 1

## 2019-09-06 MED ORDER — ONDANSETRON HCL 4 MG/2ML IJ SOLN
INTRAMUSCULAR | Status: DC | PRN
  Administered 2019-09-06: 4 mg via INTRAVENOUS

## 2019-09-06 MED ORDER — HYDROMORPHONE HCL 1 MG/ML IJ SOLN
INTRAMUSCULAR | Status: AC
  Filled 2019-09-06: qty 1

## 2019-09-06 MED ORDER — UMECLIDINIUM BROMIDE 62.5 MCG/INH IN AEPB
1.0000 | INHALATION_SPRAY | Freq: Every day | RESPIRATORY_TRACT | Status: DC
  Filled 2019-09-06: qty 7

## 2019-09-06 MED ORDER — PHENOL 1.4 % MT LIQD: 1.0000 | OROMUCOSAL | Status: DC | PRN

## 2019-09-06 MED ORDER — MULTIVITAMINS PO CAPS: 1.0000 | ORAL_CAPSULE | Freq: Every day | ORAL | Status: DC

## 2019-09-06 MED ORDER — SUGAMMADEX SODIUM 200 MG/2ML IV SOLN
INTRAVENOUS | Status: DC | PRN
  Administered 2019-09-06: 200 mg via INTRAVENOUS

## 2019-09-06 MED ORDER — THROMBIN 5000 UNITS EX SOLR
CUTANEOUS | Status: AC
  Filled 2019-09-06: qty 10000

## 2019-09-06 MED ORDER — HYDROMORPHONE HCL 1 MG/ML IJ SOLN: 0.5000 mg | INTRAMUSCULAR | Status: DC | PRN

## 2019-09-06 MED ORDER — RISPERIDONE 2 MG PO TABS
2.0000 mg | ORAL_TABLET | Freq: Every day | ORAL | Status: DC
  Filled 2019-09-06: qty 1

## 2019-09-06 MED ORDER — FAMOTIDINE 20 MG PO TABS
20.0000 mg | ORAL_TABLET | Freq: Every day | ORAL | Status: DC
  Administered 2019-09-06 – 2019-09-07 (×2): 20 mg via ORAL
  Filled 2019-09-06 (×2): qty 1

## 2019-09-06 MED ORDER — PROPOFOL 10 MG/ML IV BOLUS
INTRAVENOUS | Status: AC
  Filled 2019-09-06: qty 20

## 2019-09-06 MED ORDER — MOMETASONE FURO-FORMOTEROL FUM 200-5 MCG/ACT IN AERO
2.0000 | INHALATION_SPRAY | Freq: Two times a day (BID) | RESPIRATORY_TRACT | Status: DC
  Administered 2019-09-06 – 2019-09-08 (×4): 2 via RESPIRATORY_TRACT
  Filled 2019-09-06: qty 8.8

## 2019-09-06 MED ORDER — DEXAMETHASONE 4 MG PO TABS
4.0000 mg | ORAL_TABLET | Freq: Four times a day (QID) | ORAL | Status: DC
  Administered 2019-09-06 – 2019-09-08 (×8): 4 mg via ORAL
  Filled 2019-09-06 (×8): qty 1

## 2019-09-06 MED ORDER — ADULT MULTIVITAMIN W/MINERALS CH
1.0000 | ORAL_TABLET | Freq: Every day | ORAL | Status: DC
  Administered 2019-09-06 – 2019-09-08 (×3): 1 via ORAL
  Filled 2019-09-06 (×3): qty 1

## 2019-09-06 MED ORDER — LOSARTAN POTASSIUM 50 MG PO TABS
100.0000 mg | ORAL_TABLET | Freq: Every day | ORAL | Status: DC
  Administered 2019-09-06 – 2019-09-08 (×3): 100 mg via ORAL
  Filled 2019-09-06 (×3): qty 2

## 2019-09-06 MED ORDER — LIDOCAINE 2% (20 MG/ML) 5 ML SYRINGE
INTRAMUSCULAR | Status: DC | PRN
  Administered 2019-09-06: 60 mg via INTRAVENOUS

## 2019-09-06 MED ORDER — FENTANYL CITRATE (PF) 250 MCG/5ML IJ SOLN
INTRAMUSCULAR | Status: DC | PRN
  Administered 2019-09-06 (×3): 50 ug via INTRAVENOUS
  Administered 2019-09-06: 100 ug via INTRAVENOUS

## 2019-09-06 MED ORDER — CYCLOBENZAPRINE HCL 10 MG PO TABS
10.0000 mg | ORAL_TABLET | Freq: Three times a day (TID) | ORAL | Status: DC | PRN
  Administered 2019-09-06 – 2019-09-07 (×3): 10 mg via ORAL
  Filled 2019-09-06 (×2): qty 1

## 2019-09-06 MED ORDER — AMLODIPINE BESYLATE 10 MG PO TABS
10.0000 mg | ORAL_TABLET | Freq: Every day | ORAL | Status: DC
  Administered 2019-09-06 – 2019-09-08 (×3): 10 mg via ORAL
  Filled 2019-09-06 (×3): qty 1

## 2019-09-06 MED ORDER — FLUTICASONE PROPIONATE 50 MCG/ACT NA SUSP
2.0000 | Freq: Three times a day (TID) | NASAL | Status: DC | PRN
  Filled 2019-09-06: qty 16

## 2019-09-06 MED ORDER — BENZTROPINE MESYLATE 0.5 MG PO TABS
0.5000 mg | ORAL_TABLET | Freq: Every day | ORAL | Status: DC
  Filled 2019-09-06: qty 1

## 2019-09-06 MED ORDER — ALBUTEROL SULFATE HFA 108 (90 BASE) MCG/ACT IN AERS: 1.0000 | INHALATION_SPRAY | RESPIRATORY_TRACT | Status: DC | PRN

## 2019-09-06 MED ORDER — SODIUM CHLORIDE 0.9 % IV SOLN
INTRAVENOUS | Status: DC | PRN
  Administered 2019-09-06: 500 mL

## 2019-09-06 MED ORDER — VANCOMYCIN HCL 1000 MG IV SOLR
INTRAVENOUS | Status: DC | PRN
  Administered 2019-09-06: 1000 mg via INTRAVENOUS

## 2019-09-06 MED ORDER — VANCOMYCIN HCL IN DEXTROSE 1-5 GM/200ML-% IV SOLN
1000.0000 mg | Freq: Once | INTRAVENOUS | Status: AC
  Administered 2019-09-06: 1000 mg via INTRAVENOUS
  Filled 2019-09-06: qty 200

## 2019-09-06 MED ORDER — PANTOPRAZOLE SODIUM 40 MG PO TBEC
40.0000 mg | DELAYED_RELEASE_TABLET | Freq: Every day | ORAL | Status: DC
  Administered 2019-09-06 – 2019-09-08 (×3): 40 mg via ORAL
  Filled 2019-09-06 (×3): qty 1

## 2019-09-06 MED ORDER — THROMBIN 5000 UNITS EX SOLR
CUTANEOUS | Status: DC | PRN
  Administered 2019-09-06: 10000 [IU] via TOPICAL

## 2019-09-06 MED ORDER — MIRTAZAPINE 15 MG PO TABS: 30.0000 mg | ORAL_TABLET | Freq: Every day | ORAL | Status: DC

## 2019-09-06 MED ORDER — 0.9 % SODIUM CHLORIDE (POUR BTL) OPTIME
TOPICAL | Status: DC | PRN
  Administered 2019-09-06: 1000 mL

## 2019-09-06 MED ORDER — LIDOCAINE 2% (20 MG/ML) 5 ML SYRINGE
INTRAMUSCULAR | Status: AC
  Filled 2019-09-06: qty 10

## 2019-09-06 MED ORDER — DEXAMETHASONE SODIUM PHOSPHATE 4 MG/ML IJ SOLN
4.0000 mg | Freq: Four times a day (QID) | INTRAMUSCULAR | Status: DC
  Filled 2019-09-06: qty 1

## 2019-09-06 MED ORDER — OXYCODONE HCL 5 MG PO TABS: 5.0000 mg | ORAL_TABLET | Freq: Once | ORAL | Status: DC | PRN

## 2019-09-06 MED ORDER — ROCURONIUM BROMIDE 10 MG/ML (PF) SYRINGE
PREFILLED_SYRINGE | INTRAVENOUS | Status: AC
  Filled 2019-09-06: qty 20

## 2019-09-06 MED ORDER — SODIUM CHLORIDE 0.9 % IV SOLN: 250.0000 mL | INTRAVENOUS | Status: DC

## 2019-09-06 MED ORDER — LATANOPROST 0.005 % OP SOLN
1.0000 [drp] | Freq: Every day | OPHTHALMIC | Status: DC
  Administered 2019-09-06 – 2019-09-07 (×2): 1 [drp] via OPHTHALMIC
  Filled 2019-09-06: qty 2.5

## 2019-09-06 MED ORDER — HEMOSTATIC AGENTS (NO CHARGE) OPTIME
TOPICAL | Status: DC | PRN
  Administered 2019-09-06: 1 via TOPICAL

## 2019-09-06 SURGICAL SUPPLY — 65 items
ADH SKN CLS APL DERMABOND .7 (GAUZE/BANDAGES/DRESSINGS) ×1
APL SKNCLS STERI-STRIP NONHPOA (GAUZE/BANDAGES/DRESSINGS) ×1
BAG DECANTER FOR FLEXI CONT (MISCELLANEOUS) ×3 IMPLANT
BASKET BONE COLLECTION (BASKET) ×3 IMPLANT
BENZOIN TINCTURE PRP APPL 2/3 (GAUZE/BANDAGES/DRESSINGS) ×3 IMPLANT
BIT DRILL NEURO 2X3.1 SFT TUCH (MISCELLANEOUS) ×1 IMPLANT
BONE VIVIGEN FORMABLE 1.3CC (Bone Implant) ×3 IMPLANT
BUR MATCHSTICK NEURO 3.0 LAGG (BURR) ×3 IMPLANT
CANISTER SUCT 3000ML PPV (MISCELLANEOUS) ×3 IMPLANT
CARTRIDGE OIL MAESTRO DRILL (MISCELLANEOUS) ×1 IMPLANT
CLOSURE WOUND 1/2 X4 (GAUZE/BANDAGES/DRESSINGS) ×1
COVER WAND RF STERILE (DRAPES) ×1 IMPLANT
DERMABOND ADVANCED (GAUZE/BANDAGES/DRESSINGS) ×2
DERMABOND ADVANCED .7 DNX12 (GAUZE/BANDAGES/DRESSINGS) IMPLANT
DIFFUSER DRILL AIR PNEUMATIC (MISCELLANEOUS) ×3 IMPLANT
DRAPE C-ARM 42X72 X-RAY (DRAPES) ×6 IMPLANT
DRAPE LAPAROTOMY 100X72 PEDS (DRAPES) ×3 IMPLANT
DRAPE MICROSCOPE LEICA (MISCELLANEOUS) ×3 IMPLANT
DRILL NEURO 2X3.1 SOFT TOUCH (MISCELLANEOUS) ×3
DRSG OPSITE POSTOP 4X8 (GAUZE/BANDAGES/DRESSINGS) ×2 IMPLANT
DURAPREP 6ML APPLICATOR 50/CS (WOUND CARE) ×3 IMPLANT
ELECT COATED BLADE 2.86 ST (ELECTRODE) ×3 IMPLANT
ELECT REM PT RETURN 9FT ADLT (ELECTROSURGICAL) ×3
ELECTRODE REM PT RTRN 9FT ADLT (ELECTROSURGICAL) ×1 IMPLANT
GAUZE 4X4 16PLY RFD (DISPOSABLE) IMPLANT
GAUZE SPONGE 4X4 12PLY STRL (GAUZE/BANDAGES/DRESSINGS) ×3 IMPLANT
GLOVE BIO SURGEON STRL SZ7 (GLOVE) IMPLANT
GLOVE BIO SURGEON STRL SZ8 (GLOVE) ×3 IMPLANT
GLOVE BIOGEL PI IND STRL 7.0 (GLOVE) IMPLANT
GLOVE BIOGEL PI INDICATOR 7.0 (GLOVE)
GLOVE EXAM NITRILE XL STR (GLOVE) IMPLANT
GLOVE INDICATOR 8.5 STRL (GLOVE) ×3 IMPLANT
GOWN STRL REUS W/ TWL LRG LVL3 (GOWN DISPOSABLE) ×1 IMPLANT
GOWN STRL REUS W/ TWL XL LVL3 (GOWN DISPOSABLE) ×1 IMPLANT
GOWN STRL REUS W/TWL 2XL LVL3 (GOWN DISPOSABLE) ×3 IMPLANT
GOWN STRL REUS W/TWL LRG LVL3 (GOWN DISPOSABLE) ×3
GOWN STRL REUS W/TWL XL LVL3 (GOWN DISPOSABLE) ×3
GRAFT BNE MATRIX VG FRMBL SM 1 (Bone Implant) IMPLANT
HALTER HD/CHIN CERV TRACTION D (MISCELLANEOUS) ×3 IMPLANT
HEMOSTAT POWDER KIT SURGIFOAM (HEMOSTASIS) ×3 IMPLANT
KIT BASIN OR (CUSTOM PROCEDURE TRAY) ×3 IMPLANT
KIT TURNOVER KIT B (KITS) ×3 IMPLANT
NDL HYPO 18GX1.5 BLUNT FILL (NEEDLE) ×1 IMPLANT
NDL SPNL 20GX3.5 QUINCKE YW (NEEDLE) ×1 IMPLANT
NEEDLE HYPO 18GX1.5 BLUNT FILL (NEEDLE) ×3 IMPLANT
NEEDLE SPNL 20GX3.5 QUINCKE YW (NEEDLE) ×3 IMPLANT
NS IRRIG 1000ML POUR BTL (IV SOLUTION) ×5 IMPLANT
OIL CARTRIDGE MAESTRO DRILL (MISCELLANEOUS) ×3
PACK LAMINECTOMY NEURO (CUSTOM PROCEDURE TRAY) ×3 IMPLANT
PAD ARMBOARD 7.5X6 YLW CONV (MISCELLANEOUS) ×9 IMPLANT
PIN DISTRACTION 14MM (PIN) IMPLANT
PLATE ANT CERV XTEND 1 LV 14 (Plate) ×2 IMPLANT
RUBBERBAND STERILE (MISCELLANEOUS) ×6 IMPLANT
SCREW VAR 4.2 XD SELF DRILL 16 (Screw) ×8 IMPLANT
SPACER COLONIAL ACDF TPS 8 LG (Spacer) ×2 IMPLANT
SPONGE INTESTINAL PEANUT (DISPOSABLE) ×3 IMPLANT
SPONGE SURGIFOAM ABS GEL SZ50 (HEMOSTASIS) ×3 IMPLANT
STRIP CLOSURE SKIN 1/2X4 (GAUZE/BANDAGES/DRESSINGS) ×2 IMPLANT
SUT VIC AB 3-0 SH 8-18 (SUTURE) ×3 IMPLANT
SUT VICRYL 4-0 PS2 18IN ABS (SUTURE) ×3 IMPLANT
SYR CONTROL 10ML LL (SYRINGE) ×2 IMPLANT
TAPE CLOTH 4X10 WHT NS (GAUZE/BANDAGES/DRESSINGS) IMPLANT
TOWEL GREEN STERILE (TOWEL DISPOSABLE) ×3 IMPLANT
TOWEL GREEN STERILE FF (TOWEL DISPOSABLE) ×3 IMPLANT
WATER STERILE IRR 1000ML POUR (IV SOLUTION) ×3 IMPLANT

## 2019-09-06 NOTE — Op Note (Signed)
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  Due to patient progression of clinical syndrome imaging findings and failed conservative treatment I recommended anterior cervical discectomy and fusion at that level.  I extensively went over the risks and benefits of that procedure and operation with him as well as perioperative course expectations of outcome and alternatives of surgery and he understood and agreed to proceed forward.  Operative procedure: Patient brought into the OR was due to general anesthesia positioned supine the neck and neutral position the right-sided neck was prepped and draped in routine sterile fashion.  Preoperative x-ray localized the appropriate level so a curvilinear incision was made just off the midline to the antiborder of the sternocleidomastoid and superficial abscess was dissected out divided longitudinally the avascular plane between the sternomastoid and strap muscles was was developed down to the prevertebral fascia which was dissected away with Kitners.  Intraoperative x-ray confirmed identification the appropriate level.  Annulotomy was extended self-retaining retractor was placed after the longus goes reflected laterally then disc base was scraped and cleaned down the posterior annulus and osteophytic complex.  Under microscopic  lamination under biting of both endplates allowed indication of a very large free fragment disc it migrated subligamentous and was causing severe cord compression.  This was teased off of the spinal cord with a nerve hook and utilizing 1 and a 2 mm Kerrison punch the remainder of the posterior lung sling was removed and several large fragments of disc removed off the spinal cord marching laterally both C6 nerve roots were identified and skeletonized decompressed flush with both C6 pedicles.  After adequate endplate preparation achieved and the discectomy been completed there was no further stenosis either centrally or foraminally.  I selected an 8 mm parallel globus peek cage packed with locally harvested autograft mixed with vivigen.  I inserted this 1 to 2 mm deep to the anterior vertebral line and selected a 14 mm globus extend plate and for 16XW screws all screws had excellent purchase locking mechanism was engaged.  Postop fluoroscopy confirmed good position the implants.  Wounds and copiously irrigated to Kassim states was maintained I packed some additional bone graft material laterally to the cage and underneath the plate.  I then closed the wound in layers with Vicryl platysma and a running 4 subcuticular.  Dermabond benzoin Steri-Strips and a sterile dressing was applied.  Of note my assistant Joelene Millin was present throughout the entire case and assisted in retraction discectomy and placement of the plate.

## 2019-09-06 NOTE — Social Work (Signed)
CSW acknowledging consult for SNF placement. Will follow for therapy recommendations needed to best determine disposition/for insurance authorization.   Westley Hummer, MSW, [place] Work [phone]

## 2019-09-06 NOTE — Anesthesia Postprocedure Evaluation (Signed)
Anesthesia Post Note  Patient: Tony Sullivan  Procedure(s) Performed: Anterior Cervical Discecectomy Fusion - Cerivcal Five-Cervical Six (N/A )     Patient location during evaluation: PACU Anesthesia Type: General Level of consciousness: awake and alert Pain management: pain level controlled Vital Signs Assessment: post-procedure vital signs reviewed and stable Respiratory status: spontaneous breathing, nonlabored ventilation, respiratory function stable and patient connected to nasal cannula oxygen Cardiovascular status: blood pressure returned to baseline and stable Postop Assessment: no apparent nausea or vomiting Anesthetic complications: no    Last Vitals:  Vitals:   09/06/19 1251 09/06/19 1639  BP:  (!) 134/94  Pulse: 82 83  Resp:  20  Temp:  36.6 C  SpO2: 100%     Last Pain:  Vitals:   09/06/19 1639  TempSrc: Oral  PainSc:                  Ryan P Ellender

## 2019-09-06 NOTE — Transfer of Care (Signed)
Immediate Anesthesia Transfer of Care Note  Patient: Tony Sullivan  Procedure(s) Performed: Anterior Cervical Discecectomy Fusion - Cerivcal Five-Cervical Six (N/A )  Patient Location: PACU  Anesthesia Type:General  Level of Consciousness: awake, alert  and oriented  Airway & Oxygen Therapy: Patient Spontanous Breathing and Patient connected to nasal cannula oxygen  Post-op Assessment: Report given to RN and Post -op Vital signs reviewed and stable  Post vital signs: Reviewed and stable  Last Vitals:  Vitals Value Taken Time  BP 152/101 09/06/19 1058  Temp    Pulse 86 09/06/19 1059  Resp 24 09/06/19 1059  SpO2 100 % 09/06/19 1059  Vitals shown include unvalidated device data.  Last Pain:  Vitals:   09/06/19 0810  TempSrc:   PainSc: 1       Patients Stated Pain Goal: 3 (08/13/50 0258)  Complications: No apparent anesthesia complications

## 2019-09-06 NOTE — H&P (Addendum)
Tony Sullivan is an 58 y.o. male.   Chief Complaint: Numbness tingling weakness in his arms and hands difficulty walking HPI: 58 year old gentleman is a progressive loss of sensation upper and lower extremities difficulty walking weakness in his hands.  Work-up revealed a large disc herniation C5-6 with cord compression and signal change within his cord.  Due to patient's clinical exam consistent with a progressive myelopathy evidence of cord compression on imaging and failed conservative treatment I recommended anterior cervical discectomy and fusion at C5-6.  I have extensively gone over the risks and benefits of that procedure with him as well as perioperative course expectations of outcome and alternatives of surgery and he understands and agrees to proceed forward.  Past Medical History:  Diagnosis Date  . Arthritis   . COPD (chronic obstructive pulmonary disease) ([place]) 2004   diagnosed in 2004, no PFT's to date.  Started on home O2 12/2013, after found to be desatting at PCP's office, and referred to pulmonology.  . Depression   . GERD (gastroesophageal reflux disease)   . High blood pressure   . Seasonal allergies   . Tobacco dependence     Past Surgical History:  Procedure Laterality Date  . APPENDECTOMY  1980  . COLONOSCOPY WITH PROPOFOL N/A 12/10/2016   Procedure: COLONOSCOPY WITH PROPOFOL;  Surgeon: [name], MD;  Location: [facility];  Service: Endoscopy;  Laterality: N/A;  . HIP SURGERY Right   . SHOULDER ARTHROSCOPY Right     Family History  Problem Relation Age of Onset  . Emphysema Mother   . Allergies Mother        "everyone in family"  . Asthma Mother        "everyone in family"  . Heart disease Mother   . Clotting disorder Mother   . Cancer Mother   . Emphysema Father   . Allergies Father   . Allergies Son   . Heart disease Maternal Aunt    Social History:  reports that he has been smoking cigarettes. He has been smoking about 0.00 packs per day for the  past 36.00 years. He has never used smokeless tobacco. He reports previous alcohol use of about 14.0 - 21.0 standard drinks of alcohol per week. He reports previous drug use. Drug: Marijuana.  Allergies:  Allergies  Allergen Reactions  . Penicillins Anaphylaxis    Has patient had a PCN reaction causing immediate rash, facial/tongue/throat swelling, SOB or lightheadedness with hypotension: Yes Has patient had a PCN reaction causing severe rash involving mucus membranes or skin necrosis: No Has patient had a PCN reaction that required hospitalization No Has patient had a PCN reaction occurring within the last 10 years: No If all of the above answers are "NO", then may proceed with Cephalosporin use.   . Sulfa Antibiotics Swelling    Swelling of hands and throat   . Famotidine Other (See Comments)    Bloating of stomach, loss of appetite  . Levocetirizine Other (See Comments)    Muscle cramps    Medications Prior to Admission  Medication Sig Dispense Refill  . albuterol (PROAIR HFA) 108 (90 Base) MCG/ACT inhaler Inhale 1-2 puffs into the lungs every 4 (four) hours as needed. INHALE TWO PUFFS UP TO EVERY 4 HOURS IF NEEDED FOR SHORT OF BREATH 18 g 2  . albuterol (PROVENTIL) (2.5 MG/3ML) 0.083% nebulizer solution Take 3 mLs (2.5 mg total) by nebulization every 4 (four) hours as needed for wheezing or shortness of breath. 75 mL 3  .  amLODipine (NORVASC) 10 MG tablet Take 10 mg by mouth daily.     . benztropine (COGENTIN) 0.5 MG tablet Take 0.5 mg by mouth at bedtime.     . budesonide-formoterol (SYMBICORT) 160-4.5 MCG/ACT inhaler INHALE TWO PUFFS BY MOUTH INTO THE LUNGS TWICE A DAY 1 Inhaler 3  . cetirizine (ZYRTEC) 10 MG tablet Take 10 mg by mouth daily.    Marland Kitchen docusate sodium (COLACE) 100 MG capsule Take 100 mg by mouth daily.     . famotidine (PEPCID) 20 MG tablet TAKE ONE TABLET BY MOUTH DAILY AT BEDTIME 30 tablet 10  . fluticasone (FLONASE) 50 MCG/ACT nasal spray Place 2 sprays into both  nostrils 3 (three) times daily as needed for allergies.     Marland Kitchen HYDROcodone-acetaminophen (NORCO) 10-325 MG tablet Take 1 tablet by mouth every 6 (six) hours as needed for severe pain.    Marland Kitchen latanoprost (XALATAN) 0.005 % ophthalmic solution Place 1 drop into both eyes at bedtime.    Marland Kitchen losartan (COZAAR) 100 MG tablet Take 100 mg by mouth daily.    Marland Kitchen loxapine (LOXITANE) 10 MG capsule Take 10 mg by mouth at bedtime.     . mirtazapine (REMERON) 30 MG tablet Take 30 mg by mouth at bedtime.    . Multiple Vitamin (MULTIVITAMIN) capsule Take 1 capsule by mouth daily.    . OXYGEN Inhale 2 L into the lungs as directed. 2-3 liters with sleep and exertion as needed for low oxygen    . pantoprazole (PROTONIX) 40 MG tablet Take 1 tablet (40 mg total) by mouth daily. Take 30-60 min before first meal of the day 30 tablet 2  . polyethylene glycol (MIRALAX) 17 g packet Take 17 g by mouth daily. 14 each 1  . predniSONE (DELTASONE) 10 MG tablet Take 2 tabs daily until better then remain on 1 tab daily. (Patient taking differently: Take 10 mg by mouth daily with breakfast. ) 100 tablet 0  . risperiDONE (RISPERDAL) 2 MG tablet Take 2 mg by mouth at bedtime.    Marland Kitchen tiotropium (SPIRIVA) 18 MCG inhalation capsule Place 1 capsule (18 mcg total) into inhaler and inhale daily. 30 capsule 11  . Respiratory Therapy Supplies (FLUTTER) DEVI Use as directed 1 each 0    No results found for this or any previous visit (from the past 51 hour(s)). No results found.  Review of Systems  Musculoskeletal: Positive for neck pain.  Neurological: Positive for tingling, sensory change and focal weakness.    Blood pressure (!) 168/104, pulse 94, temperature 98.3 F (36.8 C), temperature source Oral, resp. rate 18, SpO2 100 %. Physical Exam  Constitutional: He is oriented to person, place, and time. He appears well-developed.  HENT:  Head: Normocephalic.  Eyes: Pupils are equal, round, and reactive to light.  Neck: Normal range of  motion.  Cardiovascular: Normal rate.  Respiratory: Effort normal.  GI: Soft.  Musculoskeletal: Normal range of motion.  Neurological: He is alert and oriented to person, place, and time. GCS eye subscore is 4. GCS verbal subscore is 5. GCS motor subscore is 6.  Patient is awake and alert he is good diffuse upper and lower extremity weakness 4 to 4+ out of 5 with weak hand intrinsics.  He said progressive loss of sensation and is hyperreflexic  Skin: Skin is warm and dry.     Assessment/Plan 58 year old woman presents for ACDF C5-6 Juanitta Earnhardt P, MD 09/06/2019, 8:39 AM

## 2019-09-06 NOTE — Progress Notes (Signed)
Orthopedic Tech Progress Note Patient Details:  Tony Sullivan Feb 13, 1961 [id number]  Ortho Devices Type of Ortho Device: Soft collar Ortho Device/Splint Interventions: Application   Post Interventions Patient Tolerated: Well Instructions Provided: Care of device   Tony Sullivan 09/06/2019, 11:51 AM

## 2019-09-06 NOTE — Anesthesia Procedure Notes (Signed)
Procedure Name: Intubation Date/Time: 09/06/2019 9:33 AM Performed by: Wilburn Cornelia, CRNA Pre-anesthesia Checklist: Patient identified, Emergency Drugs available, Suction available, Patient being monitored and Timeout performed Patient Re-evaluated:Patient Re-evaluated prior to induction Oxygen Delivery Method: Circle system utilized Preoxygenation: Pre-oxygenation with 100% oxygen Induction Type: IV induction Ventilation: Mask ventilation without difficulty and Oral airway inserted - appropriate to patient size Laryngoscope Size: Glidescope and 4 Grade View: Grade I Tube type: Oral Tube size: 7.5 mm Number of attempts: 1 Airway Equipment and Method: Stylet Placement Confirmation: ETT inserted through vocal cords under direct vision,  positive ETCO2,  CO2 detector and breath sounds checked- equal and bilateral Secured at: 24 cm Tube secured with: Tape Dental Injury: Teeth and Oropharynx as per pre-operative assessment

## 2019-09-06 NOTE — Anesthesia Preprocedure Evaluation (Addendum)
Anesthesia Evaluation  Patient identified by MRN, date of birth, ID band Patient awake    Reviewed: Allergy & Precautions, NPO status , Patient's Chart, lab work & pertinent test results  Airway Mallampati: II  TM Distance: >3 FB Neck ROM: Full    Dental  (+) Chipped,    Pulmonary COPD,  COPD inhaler and oxygen dependent, Current Smoker and Patient abstained from smoking.,    Pulmonary exam normal breath sounds clear to auscultation       Cardiovascular hypertension, Pt. on medications Normal cardiovascular exam Rhythm:Regular Rate:Normal  ECG: rate 82. Normal sinus rhythm Right atrial enlargement Indeterminate axis Pulmonary disease pattern   Neuro/Psych PSYCHIATRIC DISORDERS Depression  Neuromuscular disease    GI/Hepatic Neg liver ROS, GERD  Medicated and Controlled,  Endo/Other  negative endocrine ROS  Renal/GU negative Renal ROS     Musculoskeletal Wheelchair bound due to COPD   Abdominal   Peds  Hematology negative hematology ROS (+)   Anesthesia Other Findings Cervical stenosis  Reproductive/Obstetrics                           Anesthesia Physical Anesthesia Plan  ASA: IV  Anesthesia Plan: General   Post-op Pain Management:    Induction: Intravenous  PONV Risk Score and Plan: 2 and Ondansetron, Dexamethasone and Treatment may vary due to age or medical condition  Airway Management Planned: Oral ETT and Video Laryngoscope Planned  Additional Equipment:   Intra-op Plan:   Post-operative Plan: Extubation in OR  Informed Consent: I have reviewed the patients History and Physical, chart, labs and discussed the procedure including the risks, benefits and alternatives for the proposed anesthesia with the patient or authorized representative who has indicated his/her understanding and acceptance.     Dental advisory given  Plan Discussed with: CRNA  Anesthesia Plan  Comments:        Anesthesia Quick Evaluation

## 2019-09-07 ENCOUNTER — Encounter (HOSPITAL_COMMUNITY): Payer: Self-pay | Admitting: Neurosurgery

## 2019-09-07 NOTE — Evaluation (Signed)
Occupational Therapy Evaluation Patient Details Name: Tony Sullivan MRN: [id number] DOB: 12-Aug-1961 Today's Date: 09/07/2019    History of Present Illness  58 year old gentleman is a progressive loss of sensation upper and lower extremities difficulty walking weakness in his hands.  Work-up revealed a large disc herniation C5-6 with cord compression and signal change within his cord. Pt underwent ACDF at c5-6 on 11/9. PMH: COPD, GERD, depression   Clinical Impression   Patient is s/p ACDF C5-6 surgery resulting in functional limitations due to the deficits listed below (see OT problem list). Pt currently supervision level for basic transfer with RW and requesting rollator for home d/c. Pt 3L oxygen at 93% during session. Pt educated on precautions and brace don doff this session. Pt will have aide 7 days per week to (A) upon d/c.  Patient will benefit from skilled OT acutely to increase independence and safety with ADLS to allow discharge [name].     Follow Up Recommendations  Home health OT    Equipment Recommendations  Other (comment)(rollator)    Recommendations for Other Services       Precautions / Restrictions Precautions Precautions: Cervical Precaution Booklet Issued: Yes (comment) Precaution Comments: handout present and recall precautions Required Braces or Orthoses: Cervical Brace Cervical Brace: Soft collar;At all times Restrictions Weight Bearing Restrictions: No      Mobility Bed Mobility Overal bed mobility: Needs Assistance Bed Mobility: Rolling;Sidelying to Sit Rolling: Supervision Sidelying to sit: Supervision       General bed mobility comments: in chair on arrival  Transfers Overall transfer level: Needs assistance Equipment used: None Transfers: Sit to/from Stand;Stand Pivot Transfers Sit to Stand: Supervision Stand pivot transfers: Supervision       General transfer comment: requesting 4ww for home so he can have ability to sit down and rest  if he has DOE    Balance Overall balance assessment: Mild deficits observed, not formally tested                                         ADL either performed or assessed with clinical judgement   ADL Overall ADL's : Needs assistance/impaired Eating/Feeding: Supervision/ safety   Grooming: Wash/dry face;Supervision/safety;Standing                   Toilet Transfer: Supervision/safety;RW           Functional mobility during ADLs: Supervision/safety;Rolling walker General ADL Comments: pt requires (A) to doff soft collar standing in bathroom and educated on proper positioning, on the proper fit, pt allowed to view wound with mirror and educated on bathing with assistance around the wound.      Vision Baseline Vision/History: Wears glasses       Perception     Praxis      Pertinent Vitals/Pain Pain Assessment: No/denies pain Pain Score: 4  Pain Location: surgical incision Pain Descriptors / Indicators: Grimacing;Operative site guarding Pain Intervention(s): Monitored during session     Hand Dominance Right   Extremity/Trunk Assessment Upper Extremity Assessment Upper Extremity Assessment: RUE deficits/detail;LUE deficits/detail RUE Deficits / Details: numbness in the hand with decrease sensation in the 3rd 4th and 5th digit. pt denies any other changes. pt states "this hand is good" RUE Sensation: decreased light touch RUE Coordination: decreased fine motor LUE Deficits / Details: pt reports numbness elbow to hand on the lateral dorsal and volar aspect of the arm (  if the arm were sagitial plane view of the arm) pt reports L UE worse than R UE.  pt describes a trigger finger for the 4th digit but unable to demonstrate. all of 3rd digit 4th and 5th digit with sensation changes LUE Sensation: decreased light touch LUE Coordination: decreased fine motor;decreased gross motor   Lower Extremity Assessment Lower Extremity Assessment: Defer to PT  evaluation   Cervical / Trunk Assessment Cervical / Trunk Assessment: Other exceptions Cervical / Trunk Exceptions: ACDF   Communication Communication Communication: No difficulties   Cognition Arousal/Alertness: Awake/alert Behavior During Therapy: WFL for tasks assessed/performed Overall Cognitive Status: Within Functional Limits for tasks assessed                                 General Comments: pt asking to check oxygen levels showing awareness to change in breathing 93%. pt pulling oxygen out of his nose saying if i stay above 90 without it when i move i did good. pt educated to leave oxygen in his nose to rebound from movement   General Comments  VSS 3L 93% [place]    Exercises     Shoulder Instructions      Home Living Family/patient expects to be discharged to:: Private residence Living Arrangements: Alone Available Help at Discharge: Family;Friend(s);Available PRN/intermittently(has HHAide 7x/wk, 3hrs 5-8am) Type of Home: House Home Access: Ramped entrance     Home Layout: One level     Bathroom Shower/Tub: Teacher, early years/pre: Standard     Home Equipment: Wheelchair - power;Grab bars - tub/shower;Shower seat;Cane - single point   Additional Comments: reports he lives in a room in a house, shares kitchen and bathroom. has an aide 7 days week 5-8am for 12 years.      Prior Functioning/Environment Level of Independence: Needs assistance  Gait / Transfers Assistance Needed: pt reports using cane but mostly power chair over th last month ADL's / Homemaking Assistance Needed: aide assists pt with bathing, cooking, cleaning. pt states "i am a pack rat because i have everything spread out so i can find it easier. She sets out my medication my food in containers and my list so i know what to do" "my mornings are my worst part to my day Communication / Swallowing Assistance Needed: " Comments: pt reports walking outside to sit on porch without  oxygen because his oxygen levels are better but more recently has had to have oxygen 24/7. pt reports he likes the steam to his shower because it causes him to cough up stuff despite OT attempting energy conservation education.         OT Problem List: Decreased strength;Decreased activity tolerance;Impaired balance (sitting and/or standing);Decreased safety awareness;Decreased knowledge of use of DME or AE      OT Treatment/Interventions: Self-care/ADL training;Therapeutic exercise;Neuromuscular education;Energy conservation;DME and/or AE instruction;Manual therapy;Therapeutic activities;Patient/family education;Balance training    OT Goals(Current goals can be found in the care plan section) Acute Rehab OT Goals Patient Stated Goal: to get therapy to get back to walking 3 blocks multiple times a day OT Goal Formulation: With patient Time For Goal Achievement: 09/21/19 Potential to Achieve Goals: Good  OT Frequency: Min 2X/week   Barriers to D/C: Decreased caregiver support  reports that he has CNA that comes 5am-8am for last 12 years . if she is unable to arrive for any reason he has no other assistance  Co-evaluation              AM-PAC OT "6 Clicks" Daily Activity     Outcome Measure Help from another person eating meals?: None Help from another person taking care of personal grooming?: A Little Help from another person toileting, which includes using toliet, bedpan, or urinal?: A Little Help from another person bathing (including washing, rinsing, drying)?: A Little Help from another person to put on and taking off regular upper body clothing?: None Help from another person to put on and taking off regular lower body clothing?: A Little 6 Click Score: 20   End of Session Equipment Utilized During Treatment: Rolling walker;Oxygen Nurse Communication: Mobility status;Precautions  Activity Tolerance: Patient tolerated treatment well Patient left: in chair;with call  bell/phone within reach  OT Visit Diagnosis: Unsteadiness on feet (R26.81);Muscle weakness (generalized) (M62.81)                Time: 1597-3312 OT Time Calculation (min): 36 min Charges:  OT General Charges $OT Visit: 1 Visit OT Evaluation $OT Eval Moderate Complexity: 1 Mod OT Treatments $Self Care/Home Management : 8-22 mins   Brynn, OTR/L  Acute Rehabilitation Services Pager: [phone] Office: [phone] .   Jeri Modena 09/07/2019, 2:21 PM

## 2019-09-07 NOTE — Evaluation (Signed)
Physical Therapy Evaluation Patient Details Name: Tony Sullivan MRN: [id number] DOB: 1961-07-07 Today's Date: 09/07/2019   History of Present Illness   58 year old gentleman is a progressive loss of sensation upper and lower extremities difficulty walking weakness in his hands.  Work-up revealed a large disc herniation C5-6 with cord compression and signal change within his cord. Pt underwent ACDF at c5-6 on 11/9. PMH: COPD, GERD, depression  Clinical Impression  Pt admitted with above. Pt with report of renting a room in a home and having an aide 7x/wk for 3hrs in the morning. Pt with power w/c however pt able to amb 150' with RW safely and maintain SpO2 >97% on 4Lo2 via [place]. Pt functioning at supervision. Suspect pt to progress well enough to return home with HHPT to progress indep and amb without AD. Acute PT to cont to ofllow.    Follow Up Recommendations Home health PT;Supervision - Intermittent    Equipment Recommendations  Rolling walker with 5" wheels    Recommendations for Other Services       Precautions / Restrictions Precautions Precautions: Cervical Precaution Booklet Issued: Yes (comment) Precaution Comments: pt able to recall precautions Required Braces or Orthoses: Cervical Brace Cervical Brace: Soft collar;At all times Restrictions Weight Bearing Restrictions: No      Mobility  Bed Mobility Overal bed mobility: Needs Assistance Bed Mobility: Rolling;Sidelying to Sit Rolling: Supervision Sidelying to sit: Supervision       General bed mobility comments: HOB at 20 deg to mimic 4 pillows he sleeps on, pt used bed rail, no physical assist needed  Transfers Overall transfer level: Needs assistance Equipment used: None Transfers: Sit to/from [employer] Transfers Sit to Stand: Supervision Stand pivot transfers: Supervision       General transfer comment: pt steady and able to std pvt to chair without assist  Ambulation/Gait Ambulation/Gait  assistance: Min guard Gait Distance (Feet): 150 Feet Assistive device: Rolling walker (2 wheeled) Gait Pattern/deviations: Step-through pattern;Decreased stride length Gait velocity: wfl Gait velocity interpretation: 1.31 - 2.62 ft/sec, indicative of limited community ambulator General Gait Details: pt c/o SOB, SpO2 >97% on 4LO2 via [place] and wearing a mask, no episodes of LOB, no standing rest breaks  Stairs            Wheelchair Mobility    Modified Rankin (Stroke Patients Only)       Balance Overall balance assessment: Mild deficits observed, not formally tested                                           Pertinent Vitals/Pain Pain Assessment: 0-10 Pain Score: 4  Pain Location: surgical incision Pain Descriptors / Indicators: Grimacing;Operative site guarding Pain Intervention(s): Monitored during session    Home Living Family/patient expects to be discharged to:: Private residence Living Arrangements: Alone Available Help at Discharge: Family;Friend(s);Available PRN/intermittently(has HHAide 7x/wk, 3hrs 5-8am) Type of Home: House Home Access: Ramped entrance     Home Layout: One level Home Equipment: Wheelchair - power;Grab bars - tub/shower;Shower seat;Cane - single point Additional Comments: reports he lives in a room in a house, shares kitchen and bathroom    Prior Function Level of Independence: Needs assistance   Gait / Transfers Assistance Needed: pt reports using cane but mostly power chair over th last month  ADL's / Homemaking Assistance Needed: aide assists pt with bathing, cooking, cleaning  Hand Dominance   Dominant Hand: Right    Extremity/Trunk Assessment   Upper Extremity Assessment Upper Extremity Assessment: (pt reports L forearm and hand numbness, but using functional)    Lower Extremity Assessment Lower Extremity Assessment: Overall WFL for tasks assessed    Cervical / Trunk Assessment Cervical / Trunk  Assessment: Other exceptions Cervical / Trunk Exceptions: ACDF  Communication   Communication: No difficulties  Cognition Arousal/Alertness: Awake/alert Behavior During Therapy: WFL for tasks assessed/performed Overall Cognitive Status: Within Functional Limits for tasks assessed                                 General Comments: pt appears self limiting and mildly anxious regarding breathing/COPD      General Comments General comments (skin integrity, edema, etc.): VSS    Exercises     Assessment/Plan    PT Assessment Patient needs continued PT services  PT Problem List Decreased strength;Decreased range of motion;Decreased activity tolerance;Decreased balance;Decreased mobility;Decreased coordination;Decreased knowledge of use of DME;Decreased safety awareness       PT Treatment Interventions DME instruction;Gait training;Stair training;Functional mobility training;Therapeutic activities;Therapeutic exercise;Balance training    PT Goals (Current goals can be found in the Care Plan section)  Acute Rehab PT Goals PT Goal Formulation: With patient Time For Goal Achievement: 09/21/19 Potential to Achieve Goals: Good    Frequency Min 5X/week   Barriers to discharge        Co-evaluation               AM-PAC PT "6 Clicks" Mobility  Outcome Measure Help needed turning from your back to your side while in a flat bed without using bedrails?: None Help needed moving from lying on your back to sitting on the side of a flat bed without using bedrails?: None Help needed moving to and from a bed to a chair (including a wheelchair)?: None Help needed standing up from a chair using your arms (e.g., wheelchair or bedside chair)?: None Help needed to walk in hospital room?: A Little Help needed climbing 3-5 steps with a railing? : A Little 6 Click Score: 22    End of Session Equipment Utilized During Treatment: Oxygen(4LO2 on [place]) Activity Tolerance: Patient  tolerated treatment well Patient left: in chair;with call bell/phone within reach;with chair alarm set Nurse Communication: Mobility status PT Visit Diagnosis: Unsteadiness on feet (R26.81);Difficulty in walking, not elsewhere classified (R26.2)    Time: 4782-9562 PT Time Calculation (min) (ACUTE ONLY): 26 min   Charges:   PT Evaluation $PT Eval Moderate Complexity: 1 Mod PT Treatments $Gait Training: 8-22 mins        Kittie Plater, PT, DPT Acute Rehabilitation Services Pager #: [phone] Office #: [phone]   Berline Lopes 09/07/2019, 12:33 PM

## 2019-09-07 NOTE — Progress Notes (Signed)
Subjective: Patient reports Patient doing well significant provement sensation hands and feet  Objective: Vital signs in last 24 hours: Temp:  [97.5 F (36.4 C)-98.3 F (36.8 C)] 98.3 F (36.8 C) (11/10 0023) Pulse Rate:  [79-94] 83 (11/10 0023) Resp:  [15-20] 18 (11/09 2028) BP: (130-168)/(84-104) 139/99 (11/10 0023) SpO2:  [99 %-100 %] 99 % (11/10 0023)  Intake/Output from previous day: 11/09 0701 - 11/10 0700 In: 1180 [P.O.:480; I.V.:700] Out: 1600 [Urine:1550; Blood:50] Intake/Output this shift: No intake/output data recorded.  Neurologically stable incision clean dry and intact  Lab Results: No results for input(s): WBC, HGB, HCT, PLT in the last 72 hours. BMET No results for input(s): NA, K, CL, CO2, GLUCOSE, BUN, CREATININE, CALCIUM in the last 72 hours.  Studies/Results: Dg Cervical Spine 2-3 Views  Result Date: 09/06/2019 CLINICAL DATA:  Anterior cervical discectomy and fusion at the C5-6 level. EXAM: CERVICAL SPINE - 2-3 VIEW; DG C-ARM 1-60 MIN COMPARISON:  Cervical spine MR dated 08/26/2019. FINDINGS: Interval interbody and anterior screw and plate fixation at the C5-6 level with normal alignment. Moderate anterior spur formation with marked disc space narrowing is again demonstrated at the C6-7 level. The C7 level and below are obscured by the patient's shoulders. IMPRESSION: 1. Satisfactory postoperative appearance of the C5-6 ACDF. 2. Stable degenerative changes at the C6-7 level. Electronically Signed   By: Claudie Revering M.D.   On: 09/06/2019 10:49   Dg C-arm 1-60 Min  Result Date: 09/06/2019 CLINICAL DATA:  Anterior cervical discectomy and fusion at the C5-6 level. EXAM: CERVICAL SPINE - 2-3 VIEW; DG C-ARM 1-60 MIN COMPARISON:  Cervical spine MR dated 08/26/2019. FINDINGS: Interval interbody and anterior screw and plate fixation at the C5-6 level with normal alignment. Moderate anterior spur formation with marked disc space narrowing is again demonstrated at the C6-7  level. The C7 level and below are obscured by the patient's shoulders. IMPRESSION: 1. Satisfactory postoperative appearance of the C5-6 ACDF. 2. Stable degenerative changes at the C6-7 level. Electronically Signed   By: Claudie Revering M.D.   On: 09/06/2019 10:49    Assessment/Plan: Postop day 1 ACDF for myelopathy doing fairly well will work with physical therapy today depending on what his therapy needs may consider discharge later today or tomorrow  LOS: 1 day     Arcelia Pals P 09/07/2019, 7:58 AM

## 2019-09-08 MED ORDER — HYDROCODONE-ACETAMINOPHEN 5-325 MG PO TABS: 1.0000 | ORAL_TABLET | Freq: Four times a day (QID) | ORAL | 0 refills | Status: DC | PRN

## 2019-09-08 MED ORDER — CYCLOBENZAPRINE HCL 10 MG PO TABS: 10.0000 mg | ORAL_TABLET | Freq: Three times a day (TID) | ORAL | 0 refills | Status: DC | PRN

## 2019-09-08 NOTE — Progress Notes (Signed)
CSW alerted that patient would need transportation home with his electric wheelchair. CSW contacted [employer], and they need 24 hour notice to schedule transportation. CSW asked Team Lead, recommended to call [employer]. CSW contacted [employer] and they had a driver in the area, can pickup patient at [place] alerted RN that they will arrive soon to pick up patient and transport him home. No further CSW needs at this time.  Laveda Abbe,  Clinical Social Worker [phone]

## 2019-09-08 NOTE — Progress Notes (Signed)
Physical Therapy Treatment Patient Details Name: Tony Sullivan MRN: [id number] DOB: May 26, 1961 Today's Date: 09/08/2019    History of Present Illness  58 year old gentleman is a progressive loss of sensation upper and lower extremities difficulty walking weakness in his hands.  Work-up revealed a large disc herniation C5-6 with cord compression and signal change within his cord. Pt underwent ACDF at c5-6 on 11/9. PMH: COPD, GERD, depression    PT Comments    Continuing work on functional mobility and activity tolerance;  He seemed pleased with the plan to get home today, and agreed to walk; during our walk he described to me his plan for managing at home with the help of his Aide; OK for dc home from PT standpoint    Follow Up Recommendations  Home health PT;Supervision - Intermittent     Equipment Recommendations  Rolling walker with 5" wheels    Recommendations for Other Services       Precautions / Restrictions Precautions Precautions: Cervical Required Braces or Orthoses: Cervical Brace Cervical Brace: Soft collar;At all times    Mobility  Bed Mobility                  Transfers     Transfers: Sit to/from Stand Sit to Stand: Supervision         General transfer comment: Cues for hand placement  Ambulation/Gait Ambulation/Gait assistance: Min guard(without physical contact) Gait Distance (Feet): 150 Feet Assistive device: Rolling walker (2 wheeled) Gait Pattern/deviations: Step-through pattern;Decreased stride length     General Gait Details: Agreeable to a "victory lap" around the unit; cues to self onitor for activity tolernace and for fully upright posture   Stairs             Wheelchair Mobility    Modified Rankin (Stroke Patients Only)       Balance Overall balance assessment: Mild deficits observed, not formally tested                                          Cognition Arousal/Alertness: Awake/alert Behavior  During Therapy: WFL for tasks assessed/performed Overall Cognitive Status: Within Functional Limits for tasks assessed                                        Exercises      General Comments General comments (skin integrity, edema, etc.): walked on 3 L supplemental O2      Pertinent Vitals/Pain Pain Assessment: Faces Faces Pain Scale: Hurts a little bit Pain Location: surgical incision Pain Descriptors / Indicators: Grimacing;Operative site guarding Pain Intervention(s): Monitored during session(but it did not effect his activity tolerance)    Home Living                      Prior Function            PT Goals (current goals can now be found in the care plan section) Acute Rehab PT Goals Patient Stated Goal: to get therapy to get back to walking 3 blocks multiple times a day PT Goal Formulation: With patient Time For Goal Achievement: 09/21/19 Potential to Achieve Goals: Good Progress towards PT goals: Progressing toward goals    Frequency    Min 5X/week      PT Plan Current plan  remains appropriate    Co-evaluation              AM-PAC PT "6 Clicks" Mobility   Outcome Measure  Help needed turning from your back to your side while in a flat bed without using bedrails?: None Help needed moving from lying on your back to sitting on the side of a flat bed without using bedrails?: None Help needed moving to and from a bed to a chair (including a wheelchair)?: None Help needed standing up from a chair using your arms (e.g., wheelchair or bedside chair)?: None Help needed to walk in hospital room?: A Little Help needed climbing 3-5 steps with a railing? : A Little 6 Click Score: 22    End of Session Equipment Utilized During Treatment: Oxygen(3L ) Activity Tolerance: Patient tolerated treatment well Patient left: in chair;with call bell/phone within reach;with chair alarm set Nurse Communication: Mobility status PT Visit Diagnosis:  Unsteadiness on feet (R26.81);Difficulty in walking, not elsewhere classified (R26.2)     Time: 0940-7680 PT Time Calculation (min) (ACUTE ONLY): 10 min  Charges:  $Gait Training: 8-22 mins                     Roney Marion, [place]  Acute Rehabilitation Services Pager [phone] Office [phone]    Colletta [place] 09/08/2019, 4:09 PM

## 2019-09-08 NOTE — Discharge Summary (Signed)
Physician Discharge Summary  Patient ID: Tony Sullivan MRN: [id number] DOB/AGE: [birth year] 56 y.o.  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 The patient tolerated the procedure well and was taken to the recovery room and then to the floor in stable condition. The hospital course was routine. There were no complications. The wound remained clean dry and intact. Pt had appropriate neck soreness. No complaints of arm pain or new N/T/W. The patient remained afebrile with stable vital signs, and tolerated a regular diet. The patient continued to increase activities, and pain was well controlled with oral pain medications.   Consults: None  Significant Diagnostic Studies:  Results for orders placed or performed during the hospital encounter of 09/03/19  SARS CORONAVIRUS 2 (TAT 6-24 HRS) Nasopharyngeal Nasopharyngeal Swab   Specimen: Nasopharyngeal Swab  Result Value Ref Range   SARS Coronavirus 2 NEGATIVE NEGATIVE    Dg Cervical Spine 2-3 Views  Result Date: 09/06/2019 CLINICAL DATA:  Anterior cervical discectomy and fusion at the C5-6 level. EXAM: CERVICAL SPINE - 2-3 VIEW; DG C-ARM 1-60 MIN COMPARISON:  Cervical spine MR dated 08/26/2019. FINDINGS: Interval interbody and anterior screw and plate fixation at the C5-6 level with normal alignment. Moderate anterior spur formation with marked disc space narrowing is again demonstrated at the C6-7 level. The C7 level and below are obscured by the patient's shoulders. IMPRESSION: 1. Satisfactory postoperative appearance of the C5-6 ACDF. 2. Stable degenerative changes at the C6-7 level. Electronically Signed   By: Claudie Revering M.D.   On:  09/06/2019 10:49   Mr Cervical Spine W Wo Contrast  Result Date: 08/26/2019 CLINICAL DATA:  Compression fracture of L1, L4, and L5 in the last 3-4 months increased sensation of bloating and distension EXAM: MRI CERVICAL SPINE WITHOUT AND WITH CONTRAST TECHNIQUE: Multiplanar and multiecho pulse sequences of the cervical spine, to include the craniocervical junction and cervicothoracic junction, were obtained without and with intravenous contrast. CONTRAST:  39mL GADAVIST GADOBUTROL 1 MMOL/ML IV SOLN COMPARISON:  None. FINDINGS: Alignment: There is slight reversal of the normal cervical lordosis. High suspected to Vertebrae: The vertebral body heights are well maintained. No fracture, marrow edema,or pathologic marrow infiltration. Fatty endplate changes are seen at C6-C7, and C7-T1. Disc height loss with anterior osteophytes are also seen predominantly at C6-C7 and C7-T1. No areas of abnormal enhancement are seen to signify pathologic marrow infiltration. Cord: Increased T2 cord signal is seen at the C5-C6 which is seen diffusely, likely cord edema. No abnormal leptomeningeal enhancement is seen. Posterior Fossa, vertebral arteries, paraspinal tissues: The visualized portion of the posterior fossa is unremarkable. Normal flow voids seen within the vertebral arteries. There is mild edema within the supraspinous soft tissues from C5 through C7. Disc levels: C1-C2: Atlanto-axial junction is normal, without canal narrowing C2-C3: Minimal disc osteophyte complex is seen, however no significant canal or neural foraminal narrowing. C3-C4: Disc osteophyte complex and uncovertebral osteophytes are seen which causes mild bilateral neural foraminal narrowing. C4-C5: Disc osteophyte complex and uncovertebral osteophytes are seen which causes mild bilateral neural foraminal narrowing. C5-C6: Disc osteophyte complex is seen with a left paracentral disc protrusion measuring approximately 5 mm in AP dimension. The disc  protrusion contacts the exiting left C6 nerve root. There  is moderate bilateral neural foraminal narrowing. The central thecal sac measures 4 mm in AP diameter and there is increased STIR signal seen diffusely through the cord. C6-C7: There is a minimal disc osteophyte complex with disc degeneration which causes severe bilateral neural foraminal narrowing. C7-T1: There is disc height loss with degeneration and disc osteophyte complex which causes severe bilateral neural foraminal narrowing. IMPRESSION: 1. Cervical spine spondylosis most notable C5-C6 with central cord edema from a left paracentral disc protrusion which contacts the exiting left C6 nerve root. There is also moderate bilateral neural foraminal narrowing and moderate to severe central canal stenosis. 2. No evidence of pathologic marrow infiltration. These results were called by telephone at the time of interpretation on 08/26/2019 at 3:02 pm to provider Kary Kos , who verbally acknowledged these results. Electronically Signed   By: Prudencio Pair M.D.   On: 08/26/2019 15:13   Mr Lumbar Spine W Wo Contrast  Result Date: 08/26/2019 CLINICAL DATA:  Prior compression fractures in the last several months, increased bloating and pain EXAM: MRI LUMBAR SPINE WITHOUT AND WITH CONTRAST TECHNIQUE: Multiplanar and multiecho pulse sequences of the lumbar spine were obtained without and with intravenous contrast. CONTRAST:  46mL GADAVIST GADOBUTROL 1 MMOL/ML IV SOLN COMPARISON:  Radiograph August 02, 2019, CT August 06, 2019 FINDINGS: Segmentation: There are 5 non-rib bearing lumbar type vertebral bodies with the last intervertebral disc space labeled as L5-S1. Alignment:  Normal Vertebrae: There is a chronic compression deformity of the L1 vertebral body with approximately 50% loss in vertebral body height. Mild surrounding marrow edema seen. There is also a new compression deformity of the L2 vertebral body with approximately 25% loss in vertebral body height  and marrow edema in the inferior endplate. There is buckling of the posterior cortex without retropulsion of fragments. Again noted is a chronic compression/burst deformity of the L4 vertebral body with 50% loss in vertebral body height. There is buckling of the anterior superior cortex. There is mild surrounding marrow edema. No retropulsion of fragments however is noted. No areas of abnormal enhancement are seen to signify pathologic marrow infiltration. Conus medullaris and cauda equina: Conus extends to the L1 level. Conus and cauda equina appear normal. Paraspinal and other soft tissues: The paraspinal soft tissues and visualized retroperitoneal structures are unremarkable. The sacroiliac joints are intact. Disc levels: T12-L1:  No significant canal or neural foraminal narrowing. L1-L2: Facet arthrosis is seen which causes mild bilateral neural foraminal narrowing. L2-L3: There is buckling of the posterior cortex with a broad-based disc bulge and facet arthrosis which causes moderate bilateral neural foraminal narrowing. The central thecal sac is mildly effaced. L3-L4: There is a broad-based disc bulge with facet arthrosis which causes moderate bilateral neural foraminal narrowing. L4-L5: There is a broad-based disc bulge and ligamentum flavum hypertrophy which causes severe bilateral neural foraminal narrowing. The central thecal sac is mildly effaced measuring 7 mm in AP diameter. L5-S1: There is a broad-based disc bulge with a central disc protrusion which contacts the bilateral descending S1 nerve roots. There is severe bilateral neural foraminal narrowing. There is mild effacement anterior thecal sac. IMPRESSION: 1. Since the prior CT August 06, 2019 new compression deformity of the L2 vertebral body with less than 25% loss in height and surrounding marrow edema. No retropulsion of fragments. 2. Unchanged compression deformities of L1 and L4 with 50% loss in vertebral body height and slight buckling of the  posterior cortex. 3. Lumbar spine spondylosis most notable at L4-L5 with severe bilateral  neural foraminal narrowing and mild central canal stenosis and at L5-S1 with a small central disc protrusion contacting the bilateral descending S1 nerve roots with severe bilateral neural foraminal narrowing. 4. No evidence of pathologic marrow infiltration. These results were called by telephone at the time of interpretation on 08/26/2019 at 3:11 pm to provider Kary Kos , who verbally acknowledged these results. Electronically Signed   By: Prudencio Pair M.D.   On: 08/26/2019 15:12   Dg Chest Port 1 View  Result Date: 08/28/2019 CLINICAL DATA:  58 year old male with abdominal pain and shortness of breath. History of COPD. EXAM: PORTABLE CHEST 1 VIEW COMPARISON:  Chest radiograph dated 08/06/2019 FINDINGS: Emphysema. No focal consolidation, pleural effusion, or pneumothorax. The cardiac silhouette is within normal limits. No acute osseous pathology. IMPRESSION: No active cardiopulmonary disease. Electronically Signed   By: Anner Crete M.D.   On: 08/28/2019 01:01   Dg Abd Acute 2+v W 1v Chest  Result Date: 08/23/2019 CLINICAL DATA:  Acute generalized abdominal pain and distention. EXAM: DG ABDOMEN ACUTE W/ 1V CHEST COMPARISON:  August 06, 2019. FINDINGS: There is no evidence of dilated bowel loops or free intraperitoneal air. No radiopaque calculi or other significant radiographic abnormality is seen. Heart size and mediastinal contours are within normal limits. Both lungs are clear. IMPRESSION: Negative abdominal radiographs.  No acute cardiopulmonary disease. Electronically Signed   By: Marijo Conception M.D.   On: 08/23/2019 10:00   Dg C-arm 1-60 Min  Result Date: 09/06/2019 CLINICAL DATA:  Anterior cervical discectomy and fusion at the C5-6 level. EXAM: CERVICAL SPINE - 2-3 VIEW; DG C-ARM 1-60 MIN COMPARISON:  Cervical spine MR dated 08/26/2019. FINDINGS: Interval interbody and anterior screw and plate  fixation at the C5-6 level with normal alignment. Moderate anterior spur formation with marked disc space narrowing is again demonstrated at the C6-7 level. The C7 level and below are obscured by the patient's shoulders. IMPRESSION: 1. Satisfactory postoperative appearance of the C5-6 ACDF. 2. Stable degenerative changes at the C6-7 level. Electronically Signed   By: Claudie Revering M.D.   On: 09/06/2019 10:49    Antibiotics:  Anti-infectives (From admission, onward)   Start     Dose/Rate Route Frequency Ordered Stop   09/06/19 2200  vancomycin (VANCOCIN) IVPB 1000 mg/200 mL premix     1,000 mg 200 mL/hr over 60 Minutes Intravenous  Once 09/06/19 1303 09/06/19 2340   09/06/19 1003  bacitracin 50,000 Units in sodium chloride 0.9 % 500 mL irrigation  Status:  Discontinued       As needed 09/06/19 1004 09/06/19 1053      Discharge Exam: Blood pressure (!) 141/95, pulse 90, temperature 98.5 F (36.9 C), temperature source Oral, resp. rate 16, SpO2 98 %. Neurologic: Grossly normal Ambulating and voiding well, incdision cdi  Discharge Medications:   Allergies as of 09/08/2019      Reactions   Penicillins Anaphylaxis   Has patient had a PCN reaction causing immediate rash, facial/tongue/throat swelling, SOB or lightheadedness with hypotension: Yes Has patient had a PCN reaction causing severe rash involving mucus membranes or skin necrosis: No Has patient had a PCN reaction that required hospitalization No Has patient had a PCN reaction occurring within the last 10 years: No If all of the above answers are "NO", then may proceed with Cephalosporin use.   Sulfa Antibiotics Swelling   Swelling of hands and throat    Famotidine Other (See Comments)   Bloating of stomach, loss of appetite  Levocetirizine Other (See Comments)   Muscle cramps      Medication List    STOP taking these medications   HYDROcodone-acetaminophen 10-325 MG tablet Commonly known as: NORCO Replaced by:  HYDROcodone-acetaminophen 5-325 MG tablet     TAKE these medications   albuterol (2.5 MG/3ML) 0.083% nebulizer solution Commonly known as: PROVENTIL Take 3 mLs (2.5 mg total) by nebulization every 4 (four) hours as needed for wheezing or shortness of breath.   albuterol 108 (90 Base) MCG/ACT inhaler Commonly known as: ProAir HFA Inhale 1-2 puffs into the lungs every 4 (four) hours as needed. INHALE TWO PUFFS UP TO EVERY 4 HOURS IF NEEDED FOR SHORT OF BREATH   amLODipine 10 MG tablet Commonly known as: NORVASC Take 10 mg by mouth daily.   benztropine 0.5 MG tablet Commonly known as: COGENTIN Take 0.5 mg by mouth at bedtime.   budesonide-formoterol 160-4.5 MCG/ACT inhaler Commonly known as: Symbicort INHALE TWO PUFFS BY MOUTH INTO THE LUNGS TWICE A DAY   cetirizine 10 MG tablet Commonly known as: ZYRTEC Take 10 mg by mouth daily.   cyclobenzaprine 10 MG tablet Commonly known as: FLEXERIL Take 1 tablet (10 mg total) by mouth 3 (three) times daily as needed for muscle spasms.   docusate sodium 100 MG capsule Commonly known as: COLACE Take 100 mg by mouth daily.   famotidine 20 MG tablet Commonly known as: PEPCID TAKE ONE TABLET BY MOUTH DAILY AT BEDTIME   fluticasone 50 MCG/ACT nasal spray Commonly known as: FLONASE Place 2 sprays into both nostrils 3 (three) times daily as needed for allergies.   Flutter Devi Use as directed   HYDROcodone-acetaminophen 5-325 MG tablet Commonly known as: NORCO/VICODIN Take 1-2 tablets by mouth every 6 (six) hours as needed for moderate pain ((score 4 to 6)). Replaces: HYDROcodone-acetaminophen 10-325 MG tablet   latanoprost 0.005 % ophthalmic solution Commonly known as: XALATAN Place 1 drop into both eyes at bedtime.   losartan 100 MG tablet Commonly known as: COZAAR Take 100 mg by mouth daily.   loxapine 10 MG capsule Commonly known as: LOXITANE Take 10 mg by mouth at bedtime.   mirtazapine 30 MG tablet Commonly known as:  REMERON Take 30 mg by mouth at bedtime.   multivitamin capsule Take 1 capsule by mouth daily.   OXYGEN Inhale 2 L into the lungs as directed. 2-3 liters with sleep and exertion as needed for low oxygen   pantoprazole 40 MG tablet Commonly known as: PROTONIX Take 1 tablet (40 mg total) by mouth daily. Take 30-60 min before first meal of the day   polyethylene glycol 17 g packet Commonly known as: MiraLax Take 17 g by mouth daily.   predniSONE 10 MG tablet Commonly known as: DELTASONE Take 2 tabs daily until better then remain on 1 tab daily. What changed:   how much to take  how to take this  when to take this  additional instructions   risperiDONE 2 MG tablet Commonly known as: RISPERDAL Take 2 mg by mouth at bedtime.   tiotropium 18 MCG inhalation capsule Commonly known as: SPIRIVA Place 1 capsule (18 mcg total) into inhaler and inhale daily.            Durable Medical Equipment  (From admission, onward)         Start     Ordered   09/08/19 1211  For home use only DME Walker rolling  [facility])  Once    Question:  Patient needs a walker  to treat with the following condition  Answer:  H/O cervical spine surgery   09/08/19 1211          Disposition: home   Final Dx: acdf C5-6  Discharge Instructions    Call MD for:  difficulty breathing, headache or visual disturbances   Complete by: As directed    Call MD for:  extreme fatigue   Complete by: As directed    Call MD for:  hives   Complete by: As directed    Call MD for:  persistant dizziness or light-headedness   Complete by: As directed    Call MD for:  persistant nausea and vomiting   Complete by: As directed    Call MD for:  redness, tenderness, or signs of infection (pain, swelling, redness, odor or green/yellow discharge around incision site)   Complete by: As directed    Call MD for:  severe uncontrolled pain   Complete by: As directed    Diet - low sodium heart healthy   Complete by: As  directed    Driving Restrictions   Complete by: As directed    No driving for 2 weeks, no riding in the car for 1 week   Increase activity slowly   Complete by: As directed    Lifting restrictions   Complete by: As directed    No lifting more than 8 lbs   Remove dressing in 24 hours   Complete by: As directed          Signed: Ocie Cornfield Meyran 09/08/2019, 12:12 PM

## 2019-09-08 NOTE — TOC Transition Note (Addendum)
Transition of Care [facility]) - CM/SW Discharge Note Marvetta Gibbons RN,BSN Transitions of Care Unit 4NP (non trauma) - RN Case Manager [phone]   Patient Details  Name: WEAVER TWEED MRN: [id number] Date of Birth: December 06, 1960  Transition of Care [facility]) CM/SW Contact:  Dawayne [name], RN Phone Number: 09/08/2019, 3:22 PM   Clinical Narrative:    Pt stable for transition home, orders placed for HHPT/OT, CM spoke with pt at bedside- pt has home 02 with [employer], has own electric w/c at bedside and portable 02. Discussed RW needs- will have [employer] supply and deliver to room prior to discharge. - Call made to [facility] with [employer] for DME need- discussed [facility]- list provided Per CMS guidelines from medicare.gov website with star ratings (copy placed in shadow chart)- per pt he provided top choices- as [place], [place], [place]- then after that he states he does not have a preference. Call made to agencies in that order of preference [facility]- unable to accept [facility]- does not take Medicaid [facility]- unable to accept [facility]- unable to accept [facility]- has accepted referral- SOC first of next week  Pt also states he needs assistance with transportation - CSW called to assist with transportation home as pt has his w/c here and need w/c transportation.   Final next level of care: [place] Barriers to Discharge: No Barriers Identified   Patient Goals and CMS Choice Patient states their goals for this hospitalization and ongoing recovery are:: go home CMS Medicare.gov Compare Post Acute Care list provided to:: Patient Choice offered to / list presented to : Patient  Discharge Placement                [facility]        Discharge Plan and Services   Discharge Planning Services: CM Consult Post Acute Care Choice: Home Health, Durable Medical Equipment          DME Arranged: Walker rolling DME Agency: [employer] Date DME Agency Contacted: 09/08/19 Time DME  Agency Contacted: 86 Representative spoke with at DME Agency: [place]: PT, OT          Social Determinants of Health ([place]) Interventions     Readmission Risk Interventions Readmission Risk Prevention Plan 09/08/2019  Transportation Screening Complete  PCP or Specialist Appt within 3-5 Days Complete  HRI or [place] Complete  Social Work Consult for [place] Planning/Counseling Complete  Palliative Care Screening Not Applicable  Medication Review [employer]) Complete  Some recent data might be hidden

## 2019-09-09 NOTE — Progress Notes (Signed)
09/09/19- post discharge note- did not hear back from [facility] until this am regarding [place] referral- they are able to accept for [facility] services- however can not start services until first of next week- as this is the only agency that has accepted referral- noted that this would be ok- [facility] will contact pt and set up home visits.

## 2019-09-20 ENCOUNTER — Ambulatory Visit (INDEPENDENT_AMBULATORY_CARE_PROVIDER_SITE_OTHER): Payer: Medicaid Other | Admitting: Internal Medicine

## 2019-09-20 ENCOUNTER — Other Ambulatory Visit: Payer: Self-pay

## 2019-09-20 ENCOUNTER — Encounter: Payer: Self-pay | Admitting: Internal Medicine

## 2019-09-20 DIAGNOSIS — J9611 Chronic respiratory failure with hypoxia: Secondary | ICD-10-CM | POA: Diagnosis not present

## 2019-09-20 DIAGNOSIS — F17210 Nicotine dependence, cigarettes, uncomplicated: Secondary | ICD-10-CM | POA: Diagnosis not present

## 2019-09-20 DIAGNOSIS — J449 Chronic obstructive pulmonary disease, unspecified: Secondary | ICD-10-CM

## 2019-09-20 MED ORDER — AZITHROMYCIN 250 MG PO TABS: ORAL_TABLET | ORAL | 0 refills | Status: DC

## 2019-09-20 NOTE — Assessment & Plan Note (Addendum)
Active smoker  - 02/14/2014   try symbicort 160 2bid  - PFT's  02/14/2014   FEV1 1.82 (53%) and ratio 47 no better p alb (p spiriva and symbicort 160)  and DLCO 32 with 37%  07/2014 >Spiriva stopped  09/15/2014 Med calendar> not using 02/20/2015   - Declined rehab 07/31/15  - Quit smoking 11/2016  - flutter valve 04/01/2016  -med calendar 08/20/16  - added prn pred to action plan 12/05/2016  - changed to maint pred 02/25/2017 with floor of 10 mg daily >>>  - 06/05/2017  After extensive coaching device effectiveness =    90% smi > change tudorza to spiriva 2.5 x 2 > unable to afford - 11/03/2017  After extensive coaching inhaler device  effectiveness =    90% with DPI/ tudorza  - add pepcid 20 mg at hs 01/30/2018 for noct/ am wheeze - Spirometry 03/16/2018  FEV1 1.16 (34%)  Ratio 57 p am symb/ tudorza - referred again to rehab 06/10/2018  And 07/27/2018  > declined 09/18/2018  - 07/27/2018  After extensive coaching inhaler device,  effectiveness =    90% with respimat  - 09/18/2018 flutter valve training and started daily pred with floor 10 mg  - 12/21/2018  After extensive coaching inhaler device,  effectiveness =    75% (short Ti)    Group D in terms of symptom/risk and laba/lama/ICS  therefore appropriate rx at this point >>>  Continue spiriva and symb and prednisone 20 ub then 10 mg floor.  Not  using the ABCD contingency plan or prednisone in rec dose - see avs for instructions unique to this ov  Add zpak prn flare with excess or purulent mucus    Pt informed of the seriousness of COVID 19 infection as a direct risk to their health  and safey and to those of their loved ones and should continue to wear facemask in public and minimize exposure to public locations but especially avoid any area or activity where non-close contacts are not observing distancing or wearing an appropriate face mask.

## 2019-09-20 NOTE — Assessment & Plan Note (Signed)
First started on 27 December 2013 by [facility] clinic - 02/14/2014  Walked RA x 3/4  laps @ 185 ft each stopped due to  desat 85% and corrected on 3lpm to x 2 laps  - 06/15/2014  Walked 2.5lpm  2 laps  @ 185 ft each stopped due to  Sob adequate sats  - 02/20/2015  Walked RA x 3 laps @ 185 ft each stopped due to  Sob/desat p 2 laps, nl pace, eliminated on his 02 at 3lpm  - 12/05/2015   Walked RA  2 laps @ 185 ft each stopped due to  desat to 88% and corrected on 2lpm  - 03/16/2018   Walked 3lmpm  x one lap @ 185 stopped due to /desat  To 89% at relatively fast pace  - 06/10/2018   Walked RA x one lap @ 185 stopped due to  Sob / desats to 86% resolved on repeating walk on 2lpm but still sob p one lap  - 07/27/2018  Walked 2lpm continuous x 2 laps @ 185 ft each stopped due to  End of study, nl pace, no desats but stopped due to sob - 09/18/2018   Walked 0.5 lpm  x one lap @ 1=210 ft stopped due to  Sob s desats 05/13/2019 Patient Saturations on Room Air at Rest = 97% then while Ambulating = 88% Patient Saturations on 4 Liters of pulsed oxygen while Ambulating = 94%   rec as of 09/20/2019   2.5 lpm hs and 3lpm port tank walking but not needed at rest   Adequate control on present rx, reviewed in detail with pt > no change in rx needed     Each maintenance medication was reviewed in detail including most importantly the difference between maintenance and as needed and under what circumstances the prns are to be used.  Please see AVS for specific  Instructions which are unique to this visit and I personally typed out  which were reviewed in detail over the phone with the patient and a copy provided via MyChart

## 2019-09-20 NOTE — Patient Instructions (Signed)
Plan A = Automatic = Always=    symbicort and spiriva  Plan B = Backup (to supplement plan A, not to replace it) Only use your albuterol inhaler as a rescue medication to be used if you can't catch your breath by resting or doing a relaxed purse lip breathing pattern.  - The less you use it, the better it will work when you need it. - Ok to use the inhaler up to 2 puffs  every 4 hours if you must but call for appointment if use goes up over your usual need - Don't leave home without it !!  (think of it like the spare tire for your car)   Plan C = Crisis (instead of Plan B but only if Plan B stops working) - only use your albuterol nebulizer if you first try Plan B and it fails to help > ok to use the nebulizer up to every 4 hours but if start needing it regularly call for immediate appointment   Plan D = Deltasone - if you need more albuterol especially the nebulizer to get thu the day then take prednisone 10 mg x 2 each am until better then return to 1 day   zpak   If not better return to office with all your medications  If better we'll see in 6 months  - call if needed

## 2019-09-20 NOTE — Assessment & Plan Note (Signed)
Counseled re importance of smoking cessation but did not meet time criteria for separate billing   °

## 2019-09-20 NOTE — Progress Notes (Signed)
Subjective:   Patient ID: Tony Sullivan, male    DOB: [birth year]  MRN: [id number]    Brief patient profile:  28  yobm active smoker with breathing difficulties since 2004 dx as copd on advair 250 /50 but getting worse since 2014 and placed on 02 for the first time in March 2015 and referred 01/07/2014 to pulmonary clinic by Dr Kennon Holter with GOLD II copd criteria established 02/14/14    History of Present Illness  01/07/2014 1st [facility] office visit/ Tarina Volk  Chief Complaint  Patient presents with  . Advice Only    Referred for COPD, SOB.  Pt c/o SOB, low 02, prod cough with white mucous.  Pt forgot med list, unsure of what all he is taking  still can do a [employer] but uses [facility] parking, assoc with congested coughing esp in am worse in winter. rec Stop advair   Plan A =  Automatic =  symbicort 160 Take 2 puffs first thing in am and then another 2 puffs about 12 hours later. spiriva in am only  Plan B = Backup- Proventil use it only if you can't your breath - ok to use up to every 4 hours if needed Plan C = Nebulizer up to every 4 hours if needed only if you try plan B first and it doesn't wor   07/27/2018  f/u ov/Adelis Docter re: copd/ did not bring med calendar or all meds/ still smoking / prednisone 20 mg daily  Chief Complaint  Patient presents with  . Follow-up    States his breathing is about the same but when it is hot it gets much worse.   Dyspnea:  Across room even on 02 sob unless uses nebs / MMRC4  = sob if tries to leave home or while getting dressed   Cough: clear / rattling Sleeping: < 30 degrees from multiple pillows SABA use: way over using 02: 02 2lpm   rec For cough / congestion > mucinex up to 1200 mg every 12 hours and use the flutter valve as much as you can Try off tudorza and start back on spiriva 2.5  X 2 pffs each am  Call if unable to get back into [facility] rehab  Please schedule a follow up office visit in 6 weeks, call sooner if needed with all  medications /inhalers/ solutions in hand    09/18/2018  f/u ov/Khi Mcmillen re:  GOLD III copd / prednisone 20 mg / did not bring med cal as req maint on symb 160/spiriva smi Chief Complaint  Patient presents with  . Follow-up    Breathing is doing well. He is using his albuterol inhaler 4-5 per day because he states he has been walking more. He uses his neb 3 to 4 x per wk.    Dyspnea:  Can't do [employer] stopping on 1lpm but doesn't check sats or titrate up  Cough: esp in am x  first hour but mostly white and < a tbsp or two produced  Sleeping: ok flat  SABA use: as above 02: sleeping on 1.5- 2lpm and never above 1lpm walking  rec Try prednisone 10 mg daily to see if you do just as well and if flares then double the dose until better Only use your albuterol as a rescue medication  Please use flutter valve in am to help with the morning congestion     12/21/2018  f/u ov/Genevieve Ritzel re:  GOLD III copd/ pred 30 mg daily with floor  10 mg on symb 160/spiriva  Chief Complaint  Patient presents with  . Follow-up    Pt c/o sinus pressure off and on for the past wk. He is having some increased SOB this morning.    Dyspnea:  Can do shopping at grocery on 2lpm does not check sats  Cough: usual cough = rattling  Sleeping: bed is flat / pillows up to 30 degrees  SABA use: up to q 6 hours on neb  02: 2lpm  rec Plan A = Automatic = symbicort 160 Take 2 puffs first thing in am and then another 2 puffs about 12 hours later/spiriva 2.5  X 3 puffs each am  Work on inhaler technique Plan B = Backup Only use your albuterol inhaler as a rescue medication  Plan C = Crisis - only use your albuterol nebulizer if you first try Plan B and it fails to help > ok to use the nebulizer up to every 4 hours but if start needing it regularly call for immediate appointment Plan D = Deltasone  Take 2 daily until better then 1 daily    05/13/2019  f/u ov/Eneida Evers re:  COPD III copd and pred 20 mg bid  Chief Complaint  Patient  presents with  . Follow-up    Breathing has been progressively worse. He is still smoking. He is using his proair multiple x per day and neb daily.   Dyspnea:  Walking 2 blocks slow pace on 3-4lpm doesn't titrate  Cough: minimal esp am still clear  Sleeping: ok propped up 30 degrees  SABA use: way over using as above  02: no using it sitting , up to 4lpm walking, 2.5 hs  rec 02 is 2.5 lpm at bedtime and up to 4lpm POC walking   Virtual Visit via Telephone Note 09/20/2019   I connected with Tony Sullivan on 09/20/19 at  1:10 pm EST by telephone and verified that I am speaking with the correct person using two identifiers.   I discussed the limitations, risks, security and privacy concerns of performing an evaluation and management service by telephone and the availability of in person appointments. I also discussed with the patient that there may be a patient responsible charge related to this service. The patient expressed understanding and agreed to proceed.   History of Present Illness:  COPD III 02 dep on 30 mg per day prednisone Dyspnea:  Since back surgery walking with walker  Cough: clear mucus  Sleeping: sleeping at 30 degrees  SABA use: not needing  02: 2.5 bedtime/ walking on 3lpm due to decreased    No obvious day to day or daytime variability or assoc purulent sputum or mucus plugs or hemoptysis or cp or chest tightness, subjective wheeze or overt sinus or hb symptoms.    Also denies any obvious fluctuation of symptoms with weather or environmental changes or other aggravating or alleviating factors except as outlined above.   Meds reviewed/ med reconciliation completed         Observations/Objective: Anxious, talking in full sentences  Assessment and Plan: See problem list for active a/p's   Follow Up Instructions: See avs for instructions unique to this ov which includes revised/ updated med list     I discussed the assessment and treatment plan with the  patient. The patient was provided an opportunity to ask questions and all were answered. The patient agreed with the plan and demonstrated an understanding of the instructions.   The patient was advised to  call back or seek an in-person evaluation if the symptoms worsen or if the condition fails to improve as anticipated.  I provided 25 minutes of non-face-to-face time during this encounter.   Christinia Gully, MD

## 2019-09-28 ENCOUNTER — Ambulatory Visit: Payer: Medicaid Other | Admitting: Physician Assistant

## 2019-10-15 ENCOUNTER — Other Ambulatory Visit: Payer: Self-pay

## 2019-10-15 ENCOUNTER — Encounter: Payer: Self-pay | Admitting: Vascular Surgery

## 2019-10-15 ENCOUNTER — Ambulatory Visit (INDEPENDENT_AMBULATORY_CARE_PROVIDER_SITE_OTHER): Payer: Medicaid Other | Admitting: Vascular Surgery

## 2019-10-15 VITALS — BP 125/78 | HR 95 | Temp 98.3°F | Resp 20 | Ht 71.0 in | Wt 165.0 lb

## 2019-10-15 DIAGNOSIS — I739 Peripheral vascular disease, unspecified: Secondary | ICD-10-CM

## 2019-10-15 NOTE — Progress Notes (Signed)
Patient ID: Tony Sullivan, male   DOB: [birth year], 58 y.o.   MRN: [id number]  Reason for Consult: No chief complaint on file.   Referred by [name], FNP  Subjective:     HPI:  Tony Sullivan is a 58 y.o. male visit to me today in a wheelchair although he states now he can walk after having cervical spine surgery.  He does still have some residual numbness in his right upper extremity minimal in his left.  Appears to have some foot drop issues on the right leg.  Left lower extremity does not give him any issues.  Is never had vascular surgery before was smoking over 2 packs a day in the past currently down to 1 cigarette/day.  Does not take any aspirin or statin drugs.  Risk factors for vascular disease include hypertension and long smoking history.  Past Medical History:  Diagnosis Date  . Arthritis   . COPD (chronic obstructive pulmonary disease) ([place]) 2004   diagnosed in 2004, no PFT's to date.  Started on home O2 12/2013, after found to be desatting at PCP's office, and referred to pulmonology.  . Depression   . GERD (gastroesophageal reflux disease)   . High blood pressure   . Seasonal allergies   . Tobacco dependence    Family History  Problem Relation Age of Onset  . Emphysema Mother   . Allergies Mother        "everyone in family"  . Asthma Mother        "everyone in family"  . Heart disease Mother   . Clotting disorder Mother   . Cancer Mother   . Emphysema Father   . Allergies Father   . Allergies Son   . Heart disease Maternal Aunt    Past Surgical History:  Procedure Laterality Date  . ANTERIOR CERVICAL DECOMP/DISCECTOMY FUSION N/A 09/06/2019   Procedure: Anterior Cervical Discecectomy Fusion - Cerivcal Five-Cervical Six;  Surgeon: Kary Kos, MD;  Location: [facility];  Service: Neurosurgery;  Laterality: N/A;  Anterior Cervical Discecectomy Fusion - Cerivcal Five-Cervical Six  . ANTERIOR CERVICAL DISCECTOMY  09/06/2019  . APPENDECTOMY  1980  .  COLONOSCOPY WITH PROPOFOL N/A 12/10/2016   Procedure: COLONOSCOPY WITH PROPOFOL;  Surgeon: [name], MD;  Location: [facility];  Service: Endoscopy;  Laterality: N/A;  . HIP SURGERY Right   . SHOULDER ARTHROSCOPY Right     Short Social History:  Social History   Tobacco Use  . Smoking status: Current Every Day Smoker    Packs/day: 0.00    Years: 36.00    Pack years: 0.00    Types: Cigarettes  . Smokeless tobacco: Never Used  . Tobacco comment: down to 5-6 cigs a day  Substance Use Topics  . Alcohol use: Not Currently    Alcohol/week: 14.0 - 21.0 standard drinks    Types: 14 - 21 Standard drinks or equivalent per week    Comment: 2-3 beers/day, with occasional "binges".  No history of withdrawal    Allergies  Allergen Reactions  . Penicillins Anaphylaxis    Has patient had a PCN reaction causing immediate rash, facial/tongue/throat swelling, SOB or lightheadedness with hypotension: Yes Has patient had a PCN reaction causing severe rash involving mucus membranes or skin necrosis: No Has patient had a PCN reaction that required hospitalization No Has patient had a PCN reaction occurring within the last 10 years: No If all of the above answers are "NO", then may proceed with Cephalosporin  use.   . Sulfa Antibiotics Swelling    Swelling of hands and throat   . Famotidine Other (See Comments)    Bloating of stomach, loss of appetite  . Levocetirizine Other (See Comments)    Muscle cramps    Current Outpatient Medications  Medication Sig Dispense Refill  . albuterol (PROAIR HFA) 108 (90 Base) MCG/ACT inhaler Inhale 1-2 puffs into the lungs every 4 (four) hours as needed. INHALE TWO PUFFS UP TO EVERY 4 HOURS IF NEEDED FOR SHORT OF BREATH 18 g 2  . albuterol (PROVENTIL) (2.5 MG/3ML) 0.083% nebulizer solution Take 3 mLs (2.5 mg total) by nebulization every 4 (four) hours as needed for wheezing or shortness of breath. 75 mL 3  . amLODipine (NORVASC) 10 MG tablet Take 10 mg by  mouth daily.     Marland Kitchen azithromycin (ZITHROMAX) 250 MG tablet Take 2 on day one then 1 daily x 4 days 6 tablet 0  . benztropine (COGENTIN) 0.5 MG tablet Take 0.5 mg by mouth at bedtime.     . budesonide-formoterol (SYMBICORT) 160-4.5 MCG/ACT inhaler INHALE TWO PUFFS BY MOUTH INTO THE LUNGS TWICE A DAY 1 Inhaler 3  . cetirizine (ZYRTEC) 10 MG tablet Take 10 mg by mouth daily.    . cyclobenzaprine (FLEXERIL) 10 MG tablet Take 1 tablet (10 mg total) by mouth 3 (three) times daily as needed for muscle spasms. 30 tablet 0  . docusate sodium (COLACE) 100 MG capsule Take 100 mg by mouth daily.     . famotidine (PEPCID) 20 MG tablet TAKE ONE TABLET BY MOUTH DAILY AT BEDTIME 30 tablet 10  . fluticasone (FLONASE) 50 MCG/ACT nasal spray Place 2 sprays into both nostrils 3 (three) times daily as needed for allergies.     Marland Kitchen HYDROcodone-acetaminophen (NORCO/VICODIN) 5-325 MG tablet Take 1-2 tablets by mouth every 6 (six) hours as needed for moderate pain ((score 4 to 6)). 30 tablet 0  . latanoprost (XALATAN) 0.005 % ophthalmic solution Place 1 drop into both eyes at bedtime.    Marland Kitchen losartan (COZAAR) 100 MG tablet Take 100 mg by mouth daily.    Marland Kitchen loxapine (LOXITANE) 10 MG capsule Take 10 mg by mouth at bedtime.     . mirtazapine (REMERON) 30 MG tablet Take 30 mg by mouth at bedtime.    . Multiple Vitamin (MULTIVITAMIN) capsule Take 1 capsule by mouth daily.    . OXYGEN Inhale 2 L into the lungs as directed. 2-3 liters with sleep and exertion as needed for low oxygen    . pantoprazole (PROTONIX) 40 MG tablet Take 1 tablet (40 mg total) by mouth daily. Take 30-60 min before first meal of the day 30 tablet 2  . polyethylene glycol (MIRALAX) 17 g packet Take 17 g by mouth daily. 14 each 1  . predniSONE (DELTASONE) 10 MG tablet Take 2 tabs daily until better then remain on 1 tab daily. (Patient taking differently: Take 10 mg by mouth daily with breakfast. ) 100 tablet 0  . Respiratory Therapy Supplies (FLUTTER) DEVI Use as  directed 1 each 0  . risperiDONE (RISPERDAL) 2 MG tablet Take 2 mg by mouth at bedtime.    Marland Kitchen tiotropium (SPIRIVA) 18 MCG inhalation capsule Place 1 capsule (18 mcg total) into inhaler and inhale daily. 30 capsule 11   No current facility-administered medications for this visit.    Review of Systems  Constitutional:  Constitutional negative. HENT: HENT negative.  Eyes: Eyes negative.  Respiratory: Respiratory negative.  Cardiovascular: Cardiovascular negative.  GI: Gastrointestinal negative.  Musculoskeletal: Musculoskeletal negative.  Skin: Skin negative.  Neurological: Positive for focal weakness and numbness.  Hematologic: Hematologic/lymphatic negative.  Psychiatric: Psychiatric negative.        Objective:  Objective   Vitals:   10/15/19 1216  BP: 125/78  Pulse: 95  Resp: 20  Temp: 98.3 F (36.8 C)  SpO2: 97%    Physical Exam HENT:     Head: Normocephalic.     Nose: Nose normal.     Mouth/Throat:     Mouth: Mucous membranes are moist.  Eyes:     Pupils: Pupils are equal, round, and reactive to light.  Cardiovascular:     Rate and Rhythm: Normal rate.     Pulses:          Radial pulses are 2+ on the right side and 2+ on the left side.       Femoral pulses are 2+ on the right side and 2+ on the left side.      Popliteal pulses are 2+ on the right side and 0 on the left side.       Dorsalis pedis pulses are 2+ on the right side.  Pulmonary:     Effort: Pulmonary effort is normal.  Abdominal:     General: Abdomen is flat.     Palpations: Abdomen is soft. There is no mass.  Musculoskeletal:        General: Normal range of motion.  Skin:    General: Skin is warm.     Capillary Refill: Capillary refill takes less than 2 seconds.  Neurological:     General: No focal deficit present.     Mental Status: He is alert.  Psychiatric:        Mood and Affect: Mood normal.        Behavior: Behavior normal.        Thought Content: Thought content normal.         Judgment: Judgment normal.     Data:      Assessment/Plan:     58 year old male with long smoking history also with hypertension presents for evaluation of decreased ABI on the left.  He is asymptomatic on this side.  Has strongly palpable pulse on the right side.  I discussed with him protecting his left foot and have congratulated him on near smoking cessation though he has 1 more cigarette daily to go.  I have asked him to start taking baby aspirin which he will do.  I will have him follow-up with repeat ABIs in 9 to 12 months     Waynetta Sandy MD [facility]

## 2019-10-25 ENCOUNTER — Ambulatory Visit: Payer: Medicaid Other | Admitting: Physician Assistant

## 2019-11-01 ENCOUNTER — Other Ambulatory Visit: Payer: Self-pay | Admitting: *Deleted

## 2019-11-01 DIAGNOSIS — I739 Peripheral vascular disease, unspecified: Secondary | ICD-10-CM

## 2019-11-02 ENCOUNTER — Ambulatory Visit: Payer: Medicaid Other | Admitting: Physician Assistant

## 2019-11-07 ENCOUNTER — Telehealth: Payer: Self-pay | Admitting: Pulmonary Disease

## 2019-11-07 NOTE — Telephone Encounter (Signed)
Patient reporting increased productive cough, SOB / chest tightness and slight increase in home O2 requirement (now 3.5L [place] although SpO2 98% on this). Prescribed azithromycin course (sent to [facility]). Also recommended he increase his prednisone dose to 40mg  x5 days (then return to baseline 10mg  daily). Albuterol nebs as needed. Call and ER precautions given to patient who verbalized his understanding.

## 2019-11-15 ENCOUNTER — Telehealth: Payer: Self-pay | Admitting: Internal Medicine

## 2019-11-15 DIAGNOSIS — J449 Chronic obstructive pulmonary disease, unspecified: Secondary | ICD-10-CM

## 2019-11-15 MED ORDER — PREDNISONE 10 MG PO TABS: 10.0000 mg | ORAL_TABLET | Freq: Every day | ORAL | 1 refills | Status: DC

## 2019-11-15 MED ORDER — BUDESONIDE-FORMOTEROL FUMARATE 160-4.5 MCG/ACT IN AERO: INHALATION_SPRAY | RESPIRATORY_TRACT | 3 refills | Status: DC

## 2019-11-15 MED ORDER — AZITHROMYCIN 250 MG PO TABS: ORAL_TABLET | ORAL | 0 refills | Status: DC

## 2019-11-15 MED ORDER — TIOTROPIUM BROMIDE MONOHYDRATE 18 MCG IN CAPS: 18.0000 ug | ORAL_CAPSULE | Freq: Every day | RESPIRATORY_TRACT | 3 refills | Status: DC

## 2019-11-15 MED ORDER — ALBUTEROL SULFATE HFA 108 (90 BASE) MCG/ACT IN AERS: 1.0000 | INHALATION_SPRAY | RESPIRATORY_TRACT | 3 refills | Status: DC | PRN

## 2019-11-15 NOTE — Telephone Encounter (Signed)
I called and spoke with the patient to see why he is requesting z-pak. He states that the doctor on(Dr. Stretch) call sent him a z-pack to [facility] and not [facility]. I advised him that he needs to call [facility] and make them aware that he will not pick it up there and I am sending the z-pack to [facility] along with refills on his inhalers and prednisone.

## 2019-11-19 ENCOUNTER — Telehealth (HOSPITAL_COMMUNITY): Payer: Self-pay | Admitting: Nurse Practitioner

## 2019-11-22 ENCOUNTER — Telehealth (HOSPITAL_COMMUNITY): Payer: Self-pay | Admitting: *Deleted

## 2019-11-22 NOTE — Telephone Encounter (Signed)
Returned phone call, patient is interested in participating in pulmonary rehab.  We discussed that it would be virtual pulmonary rehab through the [employer] app.  Patient does not have a smart phone and is only interested in coming to exercise physically.  Also, patient was recently discharged from physical therapy s/p spine surgery and is still experiencing burning in leg and "dragging his foot".  States he has an appointment with [facility] to be evaluated to see if he needs additional surgery.  Discussed that he needs to be cleared by neurology before participating in pulmonary rehab. Instructed to follow physical therapy exercise instructions that were given to him.  Also, call us back when he is cleared from his neurologist and to see when we are able for patients to exercise in person twice a week.

## 2019-11-24 ENCOUNTER — Telehealth: Payer: Self-pay | Admitting: Internal Medicine

## 2019-11-24 DIAGNOSIS — J9611 Chronic respiratory failure with hypoxia: Secondary | ICD-10-CM

## 2019-11-24 NOTE — Telephone Encounter (Signed)
This is not respiratory equipment and best managed by PCP but if not able to reach her we can approve it

## 2019-11-24 NOTE — Addendum Note (Signed)
Addended by: Amado Coe on: 11/24/2019 04:40 PM   Modules accepted: Orders

## 2019-11-24 NOTE — Telephone Encounter (Signed)
Spoke with pt. He is needing a new shower chair, his current one is old. Pt uses [place].  MW - please advise. Thanks!

## 2019-11-24 NOTE — Telephone Encounter (Signed)
Spoke with patient.  He has tried to contact PCP but they aren't helping him or it is taking them long. States he has been trying to get things ordered for a year.  He needs a new PCP and he is looking for one.   I went ahead and put in order for chair.  Nothing further needed at this time

## 2019-11-28 ENCOUNTER — Telehealth: Payer: Self-pay | Admitting: Pulmonary Disease

## 2019-11-28 MED ORDER — ALBUTEROL SULFATE HFA 108 (90 BASE) MCG/ACT IN AERS: 2.0000 | INHALATION_SPRAY | RESPIRATORY_TRACT | 3 refills | Status: DC | PRN

## 2019-11-28 NOTE — Telephone Encounter (Signed)
Followed by Dr. [name] for COPD.  He is concerned about expense of his inhaler therapy.  Asked to have 90 day supply of proair sent to [facility].  Confirmed pharmacy location and script sent.

## 2019-11-30 ENCOUNTER — Telehealth: Payer: Self-pay | Admitting: Adult Health

## 2019-11-30 MED ORDER — AZITHROMYCIN 250 MG PO TABS: ORAL_TABLET | ORAL | 0 refills | Status: DC

## 2019-11-30 NOTE — Telephone Encounter (Signed)
Spoke with patient. He is aware that the zpak will be called in. Verbalized understanding of instructions.   Nothing further needed at time of call.

## 2019-11-30 NOTE — Telephone Encounter (Signed)
Patient called in wanting something called in for cold like symptoms . Caught a chest cold . Wants an antibiotic called in .  Send to [facility]

## 2019-11-30 NOTE — Telephone Encounter (Signed)
zpak and follow prior action plans on as needed meds ( ABCD on prior  on AVS)

## 2019-12-09 ENCOUNTER — Telehealth: Payer: Self-pay | Admitting: Internal Medicine

## 2019-12-09 MED ORDER — PREDNISONE 10 MG PO TABS: 10.0000 mg | ORAL_TABLET | Freq: Every day | ORAL | 2 refills | Status: DC

## 2019-12-09 NOTE — Telephone Encounter (Signed)
Let him know I refilled his prednisone  At [facility]

## 2019-12-09 NOTE — Telephone Encounter (Signed)
Instructions from last OV 11/23  Plan A = Automatic = Always=    symbicort and spiriva  Plan B = Backup (to supplement plan A, not to replace it) Only use your albuterol inhaler as a rescue medication to be used if you can't catch your breath by resting or doing a relaxed purse lip breathing pattern.  - The less you use it, the better it will work when you need it. - Ok to use the inhaler up to 2 puffs  every 4 hours if you must but call for appointment if use goes up over your usual need - Don't leave home without it !!  (think of it like the spare tire for your car)   Plan C = Crisis (instead of Plan B but only if Plan B stops working) - only use your albuterol nebulizer if you first try Plan B and it fails to help > ok to use the nebulizer up to every 4 hours but if start needing it regularly call for immediate appointment   Plan D = Deltasone - if you need more albuterol especially the nebulizer to get thu the day then take prednisone 10 mg x 2 each am until better then return to 1 day   zpak   If not better return to office with all your medications  If better we'll see in 6 months  - call if needed      Called and spoke with pt letting him know that MW sent Rx for prednisone to pharmacy with quantity of 100tabs and 2 additional refills. Pt verbalized understanding. Nothing further needed.

## 2019-12-15 ENCOUNTER — Other Ambulatory Visit: Payer: Self-pay | Admitting: Internal Medicine

## 2019-12-20 ENCOUNTER — Telehealth: Payer: Self-pay | Admitting: Internal Medicine

## 2019-12-20 MED ORDER — PREDNISONE 10 MG PO TABS: 10.0000 mg | ORAL_TABLET | Freq: Every day | ORAL | 2 refills | Status: DC

## 2019-12-20 NOTE — Telephone Encounter (Signed)
Called and spoke to pt. Pt states he needs the spiriva respimat instead of the capsule inhaler, this has already been sent to pharmacy. Pt aware. Pt also states he is taking 1-2 tabs of pred 10mg  as prescribed by Dr. [name]. However, the pharmacy fills the script for #30 instead of the #100 that is sent by MW. Called pharmacy and was advised his insurance will only pay for #30 if written to take '1 tab qd'. Pharmacist states to write the script '1-2 tabs qd' to allow pt enough tabs per month. This has been sent. Will send to MW as Juluis Rainier.    Instructions from last OV in 08/2019: Plan A = Automatic = Always=    symbicort and spiriva   Plan B = Backup (to supplement plan A, not to replace it) Only use your albuterol inhaler as a rescue medication to be used if you can't catch your breath by resting or doing a relaxed purse lip breathing pattern.  - The less you use it, the better it will work when you need it. - Ok to use the inhaler up to 2 puffs  every 4 hours if you must but call for appointment if use goes up over your usual need - Don't leave home without it !!  (think of it like the spare tire for your car)    Plan C = Crisis (instead of Plan B but only if Plan B stops working) - only use your albuterol nebulizer if you first try Plan B and it fails to help > ok to use the nebulizer up to every 4 hours but if start needing it regularly call for immediate appointment     Plan D = Deltasone - if you need more albuterol especially the nebulizer to get thu the day then take prednisone 10 mg x 2 each am until better then return to 1 day    zpak    If not better return to office with all your medications   If better we'll see in 6 months  - call if needed

## 2020-01-03 ENCOUNTER — Telehealth: Payer: Self-pay | Admitting: Internal Medicine

## 2020-01-03 MED ORDER — POLYETHYLENE GLYCOL 3350 17 G PO PACK: 17.0000 g | PACK | Freq: Every day | ORAL | 2 refills | Status: DC

## 2020-01-03 MED ORDER — POLYETHYLENE GLYCOL 3350 17 GM/SCOOP PO POWD: 1.0000 | Freq: Every day | ORAL | 1 refills | Status: DC

## 2020-01-03 NOTE — Telephone Encounter (Signed)
Refilled Miralax per patient's request.  Patient due for colonoscopy at hospital - told patient I would check on Dr. Blanch Media hospital schedule and call him back.

## 2020-01-18 ENCOUNTER — Other Ambulatory Visit: Payer: Self-pay | Admitting: Internal Medicine

## 2020-02-06 ENCOUNTER — Encounter (HOSPITAL_COMMUNITY): Payer: Self-pay | Admitting: Emergency Medicine

## 2020-02-06 ENCOUNTER — Other Ambulatory Visit: Payer: Self-pay

## 2020-02-06 ENCOUNTER — Emergency Department (HOSPITAL_COMMUNITY)
Admission: EM | Admit: 2020-02-06 | Discharge: 2020-02-07 | Disposition: A | Discharge status: Home / Self Care | Payer: Medicaid Other | Attending: Emergency Medicine | Admitting: Emergency Medicine

## 2020-02-06 DIAGNOSIS — I10 Essential (primary) hypertension: Secondary | ICD-10-CM | POA: Insufficient documentation

## 2020-02-06 DIAGNOSIS — J449 Chronic obstructive pulmonary disease, unspecified: Secondary | ICD-10-CM | POA: Insufficient documentation

## 2020-02-06 DIAGNOSIS — R2243 Localized swelling, mass and lump, lower limb, bilateral: Secondary | ICD-10-CM | POA: Diagnosis present

## 2020-02-06 DIAGNOSIS — F17210 Nicotine dependence, cigarettes, uncomplicated: Secondary | ICD-10-CM | POA: Insufficient documentation

## 2020-02-06 DIAGNOSIS — Z79899 Other long term (current) drug therapy: Secondary | ICD-10-CM | POA: Diagnosis not present

## 2020-02-06 DIAGNOSIS — L03115 Cellulitis of right lower limb: Secondary | ICD-10-CM | POA: Insufficient documentation

## 2020-02-06 DIAGNOSIS — R609 Edema, unspecified: Secondary | ICD-10-CM

## 2020-02-06 DIAGNOSIS — R600 Localized edema: Secondary | ICD-10-CM | POA: Diagnosis not present

## 2020-02-06 LAB — BASIC METABOLIC PANEL
Anion gap: 9 (ref 5–15)
BUN: 14 mg/dL (ref 6–20)
CO2: 24 mmol/L (ref 22–32)
Calcium: 8.8 mg/dL — ABNORMAL LOW (ref 8.9–10.3)
Chloride: 107 mmol/L (ref 98–111)
Creatinine, Ser: 1.13 mg/dL (ref 0.61–1.24)
GFR calc Af Amer: >60 mL/min (ref >60)
GFR calc non Af Amer: >60 mL/min (ref >60)
Glucose, Bld: 138 mg/dL — ABNORMAL HIGH (ref 70–99)
Potassium: 4.1 mmol/L (ref 3.5–5.1)
Sodium: 140 mmol/L (ref 135–145)

## 2020-02-06 LAB — CBC
HCT: 47.2 % (ref 39.0–52.0)
Hemoglobin: 14.5 g/dL (ref 13.0–17.0)
MCH: 30.7 pg (ref 26.0–34.0)
MCHC: 30.7 g/dL (ref 30.0–36.0)
MCV: 99.8 fL (ref 80.0–100.0)
Platelets: 357 10*3/uL (ref 150–400)
RBC: 4.73 MIL/uL (ref 4.22–5.81)
RDW: 15.1 % (ref 11.5–15.5)
WBC: 10.5 10*3/uL (ref 4.0–10.5)
nRBC: 0 % (ref 0.0–0.2)

## 2020-02-06 NOTE — ED Triage Notes (Addendum)
Pt reports bilateral feet swelling over the past week. Pt reports mostly on the right side with some tingling and weakness. Pt thinks he may have been bit by a spider on the right ankle. Pt reports was seen for same issue and was never diagnosed with anything. Pt reports all of this started after having a neck surgery 6 months ago. Pt wears 3L [place] at baseline. Pt states borderline diabetic. No other neuro symptoms. Hx of COPD and HTN.

## 2020-02-07 ENCOUNTER — Emergency Department (HOSPITAL_COMMUNITY): Payer: Medicaid Other

## 2020-02-07 LAB — BRAIN NATRIURETIC PEPTIDE: B Natriuretic Peptide: 20.4 pg/mL (ref 0.0–100.0)

## 2020-02-07 IMAGING — DX DG CHEST 1V PORT
1 series · 1 of 1 positions shown · non-contrast
Comparison: [DATE]

CLINICAL DATA: Cough short of breath and leg swelling

EXAM:
PORTABLE CHEST 1 VIEW

[chest ap]
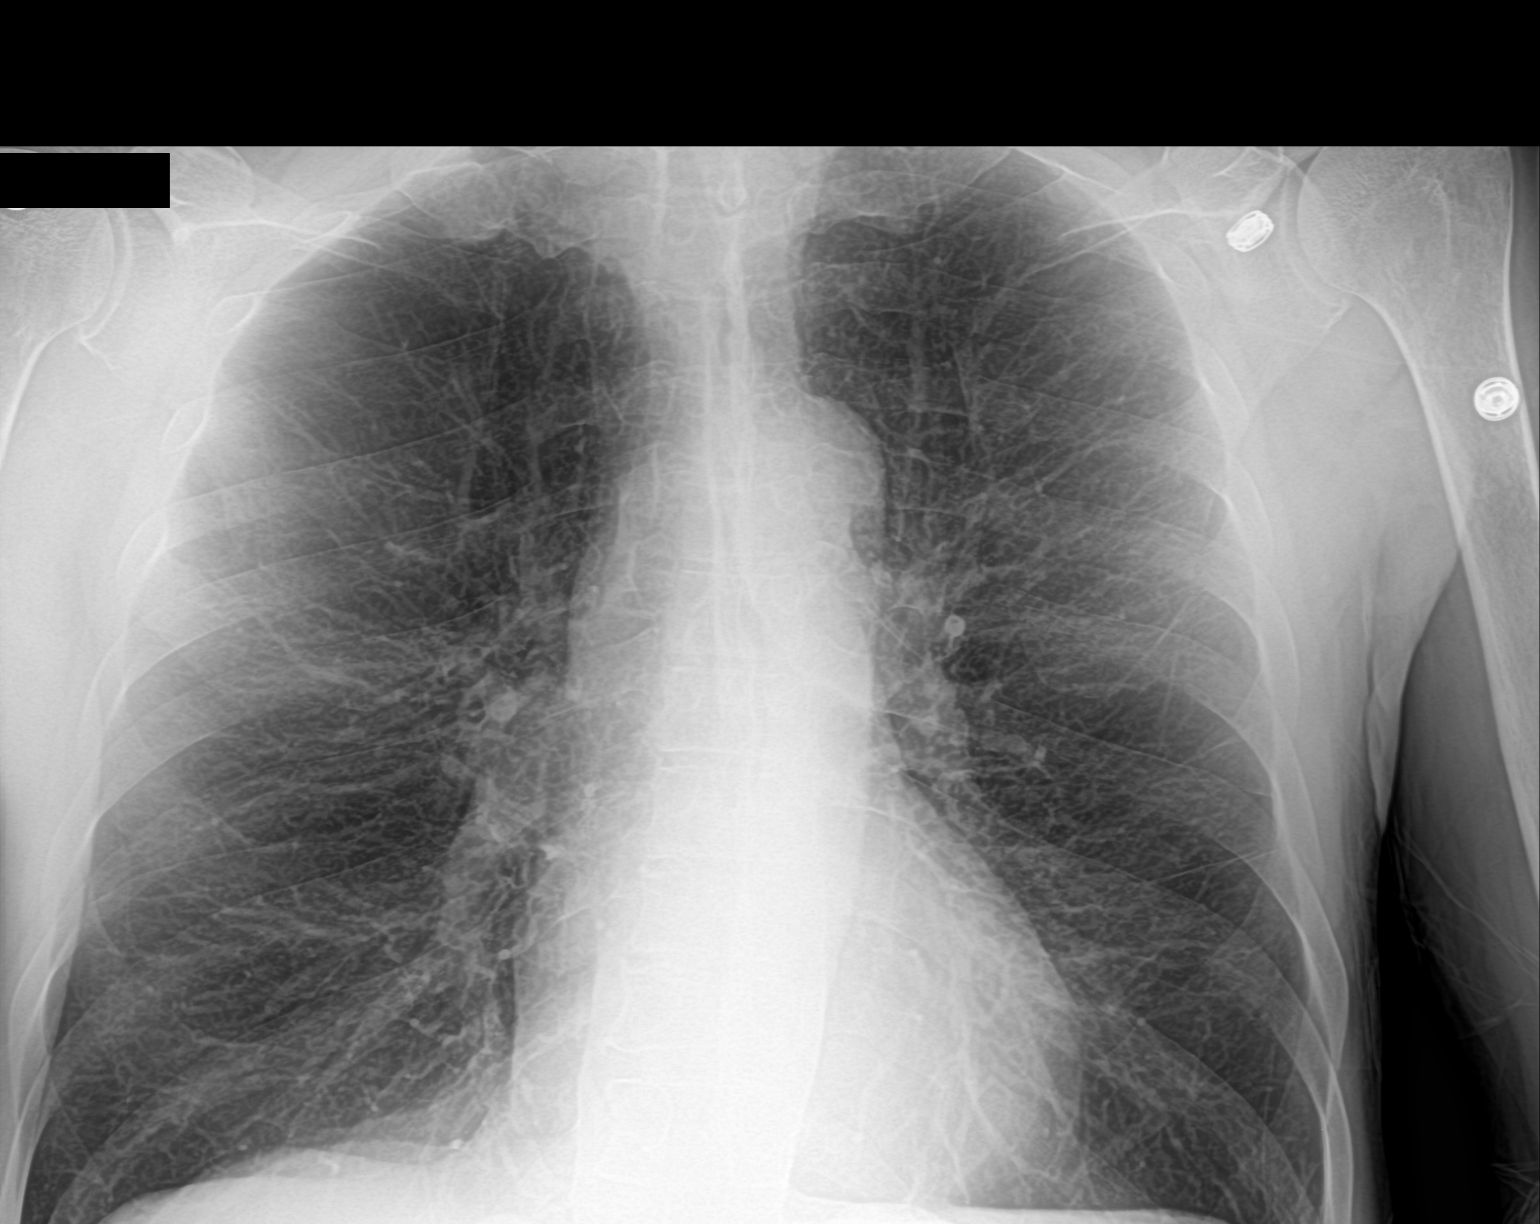

[1 of 1 positions shown; findings below may reference images not displayed]

FINDINGS: Hyperinflated lungs with emphysematous disease and chronic
bronchitic changes. No consolidation or effusion. Normal heart size.
No pneumothorax.
IMPRESSION: No active disease. Emphysematous disease and chronic bronchitic
changes

## 2020-02-07 MED ORDER — DOXYCYCLINE HYCLATE 100 MG PO CAPS: 100.0000 mg | ORAL_CAPSULE | Freq: Two times a day (BID) | ORAL | 0 refills | Status: DC

## 2020-02-07 MED ORDER — ALBUTEROL SULFATE HFA 108 (90 BASE) MCG/ACT IN AERS
8.0000 | INHALATION_SPRAY | Freq: Once | RESPIRATORY_TRACT | Status: AC
  Administered 2020-02-07: 8 via RESPIRATORY_TRACT
  Filled 2020-02-07: qty 6.7

## 2020-02-07 MED ORDER — AEROCHAMBER PLUS FLO-VU LARGE MISC
Status: AC
  Filled 2020-02-07: qty 1

## 2020-02-07 MED ORDER — AEROCHAMBER PLUS FLO-VU LARGE MISC
1.0000 | Freq: Once | Status: AC
  Administered 2020-02-07: 1

## 2020-02-07 NOTE — ED Provider Notes (Signed)
[facility] EMERGENCY DEPARTMENT Provider Note   CSN: [id number] Arrival date & time: 02/06/20  1905     History Chief Complaint  Patient presents with  . Foot Swelling    Tony Sullivan is a 59 y.o. male with a hx of arthritis, COPD, GERD, hypertension presents to the Emergency Department complaining of gradual, persistent, progressively worsening bilateral foot swelling onset 1 week ago.  Patient reports he has had intermittent swelling over the last several years since he had neck surgery but this episode started last week.  He reports he has increased his salt intake but does not believe that is why his feet are swelling.  He denies a cardiac history or history of congestive heart failure.  He does not take a diuretic.  Patient also reports he was "bitten by a bug" on the right side of his ankle several days ago.  He reports he felt it bite him in bed but did not ever see a bug.  He states that since that time the area has been more red and tender.  He reported that symptoms today are no worse than they have been over the last several days but he felt like today was a good day to get checked out.  Nothing seems to make his symptoms better or worse.  No treatments prior to arrival.  Patient reports history of COPD but no new shortness of breath and no worsening shortness of breath on exertion.  He wears oxygen at baseline.  The history is provided by the patient and medical records. No language interpreter was used.       Past Medical History:  Diagnosis Date  . Arthritis   . COPD (chronic obstructive pulmonary disease) ([place]) 2004   diagnosed in 2004, no PFT's to date.  Started on home O2 12/2013, after found to be desatting at PCP's office, and referred to pulmonology.  . Depression   . GERD (gastroesophageal reflux disease)   . High blood pressure   . Seasonal allergies   . Tobacco dependence     Patient Active Problem List   Diagnosis Date Noted  . HNP  (herniated nucleus pulposus), cervical 09/06/2019  . Abdominal pain   . Poor dentition 11/04/2017  . Acute on chronic respiratory failure with hypoxia ([place]) 08/29/2017  . ETOH abuse 03/30/2017  . Cigarette smoker 03/30/2017  . Acute respiratory failure with hypoxia and hypercapnia ([place]) 03/29/2017  . Special screening for malignant neoplasms, colon   . Benign neoplasm of ascending colon   . Benign neoplasm of descending colon   . Hoarseness 08/22/2016  . GERD (gastroesophageal reflux disease) 08/22/2016  . Atypical chest pain 12/05/2015  . Essential hypertension 06/15/2014  . Pectoralis muscle strain 01/11/2014  . COPD with acute exacerbation ([place]) 01/10/2014  . COPD  GOLD III 01/08/2014  . Smoker 01/08/2014  . Cough 01/08/2014  . Chronic respiratory failure with hypoxia ([place]) 01/08/2014    Past Surgical History:  Procedure Laterality Date  . ANTERIOR CERVICAL DECOMP/DISCECTOMY FUSION N/A 09/06/2019   Procedure: Anterior Cervical Discecectomy Fusion - Cerivcal Five-Cervical Six;  Surgeon: Kary Kos, MD;  Location: [facility];  Service: Neurosurgery;  Laterality: N/A;  Anterior Cervical Discecectomy Fusion - Cerivcal Five-Cervical Six  . ANTERIOR CERVICAL DISCECTOMY  09/06/2019  . APPENDECTOMY  1980  . COLONOSCOPY WITH PROPOFOL N/A 12/10/2016   Procedure: COLONOSCOPY WITH PROPOFOL;  Surgeon: [name], MD;  Location: [facility];  Service: Endoscopy;  Laterality: N/A;  . HIP SURGERY  Right   . SHOULDER ARTHROSCOPY Right        Family History  Problem Relation Age of Onset  . Emphysema Mother   . Allergies Mother        "everyone in family"  . Asthma Mother        "everyone in family"  . Heart disease Mother   . Clotting disorder Mother   . Cancer Mother   . Emphysema Father   . Allergies Father   . Allergies Son   . Heart disease Maternal Aunt     Social History   Tobacco Use  . Smoking status: Current Every Day Smoker    Packs/day: 0.00    Years: 36.00    Pack  years: 0.00    Types: Cigarettes  . Smokeless tobacco: Never Used  . Tobacco comment: down to 5-6 cigs a day  Substance Use Topics  . Alcohol use: Not Currently    Alcohol/week: 14.0 - 21.0 standard drinks    Types: 14 - 21 Standard drinks or equivalent per week    Comment: 2-3 beers/day, with occasional "binges".  No history of withdrawal  . Drug use: Not Currently    Types: Marijuana    Comment: as of November has not used for a month    Home Medications Prior to Admission medications   Medication Sig Start Date End Date Taking? Authorizing Provider  famotidine (PEPCID) 20 MG tablet TAKE ONE TABLET BY MOUTH DAILY AT BEDTIME 01/18/20   [name], MD  albuterol (PROAIR HFA) 108 (90 Base) MCG/ACT inhaler Inhale 2 puffs into the lungs every 4 (four) hours as needed. INHALE TWO PUFFS UP TO EVERY 4 HOURS IF NEEDED FOR SHORT OF BREATH 11/28/19   Chesley Mires, MD  albuterol (PROVENTIL) (2.5 MG/3ML) 0.083% nebulizer solution Take 3 mLs (2.5 mg total) by nebulization every 4 (four) hours as needed for wheezing or shortness of breath. 04/28/18   [name], NP  amLODipine (NORVASC) 10 MG tablet Take 10 mg by mouth daily.     [provider]  azithromycin (ZITHROMAX) 250 MG tablet Take 2 on day one then 1 daily x 4 days 11/15/19   [name], MD  azithromycin (ZITHROMAX) 250 MG tablet Take 2 tablets on first day, then 1 tablet daily until finished. 11/30/19   [name], MD  benztropine (COGENTIN) 0.5 MG tablet Take 0.5 mg by mouth at bedtime.     [provider]  budesonide-formoterol (SYMBICORT) 160-4.5 MCG/ACT inhaler INHALE TWO PUFFS BY MOUTH INTO THE LUNGS TWICE A DAY 11/15/19   [name], MD  cetirizine (ZYRTEC) 10 MG tablet Take 10 mg by mouth daily.    [provider]  cyclobenzaprine (FLEXERIL) 10 MG tablet Take 1 tablet (10 mg total) by mouth 3 (three) times daily as needed for muscle spasms. 09/08/19   Meyran, Ocie Cornfield, NP  docusate  sodium (COLACE) 100 MG capsule Take 100 mg by mouth daily.     [provider]  doxycycline (VIBRAMYCIN) 100 MG capsule Take 1 capsule (100 mg total) by mouth 2 (two) times daily. 02/07/20   Veryl Abril, Jarrett Soho, PA-C  fluticasone (FLONASE) 50 MCG/ACT nasal spray Place 2 sprays into both nostrils 3 (three) times daily as needed for allergies.     [provider]  HYDROcodone-acetaminophen (NORCO/VICODIN) 5-325 MG tablet Take 1-2 tablets by mouth every 6 (six) hours as needed for moderate pain ((score 4 to 6)). 09/08/19   Meyran, Ocie Cornfield, NP  latanoprost (XALATAN) 0.005 % ophthalmic solution Place 1 drop into both eyes at bedtime. 05/24/19   [provider]  losartan (COZAAR) 100 MG tablet Take 100 mg by mouth daily.    [provider]  loxapine (LOXITANE) 10 MG capsule Take 10 mg by mouth at bedtime.     [provider]  mirtazapine (REMERON) 30 MG tablet Take 30 mg by mouth at bedtime.    [provider]  Multiple Vitamin (MULTIVITAMIN) capsule Take 1 capsule by mouth daily.    [provider]  OXYGEN Inhale 2 L into the lungs as directed. 2-3 liters with sleep and exertion as needed for low oxygen    [provider]  pantoprazole (PROTONIX) 40 MG tablet Take 1 tablet (40 mg total) by mouth daily. Take 30-60 min before first meal of the day 08/13/19   [name], MD  polyethylene glycol (MIRALAX) 17 g packet Take 17 g by mouth daily. 01/03/20   [name], MD  predniSONE (DELTASONE) 10 MG tablet Take 1-2 tablets (10-20 mg total) by mouth daily with breakfast. 12/20/19   [name], MD  Respiratory Therapy Supplies (FLUTTER) DEVI Use as directed 12/21/18   [name], MD  risperiDONE (RISPERDAL) 2 MG tablet Take 2 mg by mouth at bedtime.    [provider]  SPIRIVA RESPIMAT 2.5 MCG/ACT AERS INHALE TWO PUFFS BY MOUTH EVERY MORNING 12/20/19   [name], MD    Allergies    Penicillins, Sulfa  antibiotics, Famotidine, and Levocetirizine  Review of Systems   Review of Systems  Constitutional: Negative for appetite change, diaphoresis, fatigue, fever and unexpected weight change.  HENT: Negative for mouth sores.   Eyes: Negative for visual disturbance.  Respiratory: Negative for cough, chest tightness, shortness of breath and wheezing.   Cardiovascular: Positive for leg swelling. Negative for chest pain.  Gastrointestinal: Negative for abdominal pain, constipation, diarrhea, nausea and vomiting.  Endocrine: Negative for polydipsia, polyphagia and polyuria.  Genitourinary: Negative for dysuria, frequency, hematuria and urgency.  Musculoskeletal: Negative for back pain and neck stiffness.  Skin: Positive for wound. Negative for rash.  Allergic/Immunologic: Negative for immunocompromised state.  Neurological: Negative for syncope, light-headedness and headaches.  Hematological: Does not bruise/bleed easily.  Psychiatric/Behavioral: Negative for sleep disturbance. The patient is not nervous/anxious.     Physical Exam Updated Vital Signs BP 124/90 (BP Location: Right Arm)   Pulse 66   Temp 98.2 F (36.8 C) (Oral)   Resp 18   Ht 5\' 11"  (1.803 m)   Wt 74.8 kg   SpO2 100%   BMI 23.01 kg/m   Physical Exam Vitals and nursing note reviewed.  Constitutional:      General: He is not in acute distress.    Appearance: He is not diaphoretic.  HENT:     Head: Normocephalic.  Eyes:     General: No scleral icterus.    Conjunctiva/sclera: Conjunctivae normal.  Cardiovascular:     Rate and Rhythm: Normal rate and regular rhythm.     Pulses: Normal pulses.          Radial pulses are 2+ on the right side and 2+ on the left side.     Comments: Edema of the feet and ankles, no edema of the calf Pulmonary:     Effort: No tachypnea, accessory muscle usage, prolonged expiration, respiratory distress or retractions.     Breath sounds: No stridor. Wheezing (throughout) present. No rales.  Comments: Equal chest rise. No increased work of breathing. Abdominal:     General: There is no distension.     Palpations: Abdomen is soft.     Tenderness: There is no abdominal tenderness. There is no guarding or rebound.  Musculoskeletal:     Cervical back: Normal range of motion.     Right lower leg: 1+ Edema present.     Left lower leg: 1+ Edema present.     Comments: Moves all extremities equally and without difficulty.  Skin:    General: Skin is warm and dry.     Capillary Refill: Capillary refill takes less than 2 seconds.     Comments: Small, circular lesion just proximal to the right lateral malleolus with surrounding erythema and warmth, no induration or fluctuance.    Neurological:     Mental Status: He is alert.     GCS: GCS eye subscore is 4. GCS verbal subscore is 5. GCS motor subscore is 6.     Comments: Speech is clear and goal oriented.  Psychiatric:        Mood and Affect: Mood normal.     ED Results / Procedures / Treatments   Labs (all labs ordered are listed, but only abnormal results are displayed) Labs Reviewed  BASIC METABOLIC PANEL - Abnormal; Notable for the following components:      Result Value   Glucose, Bld 138 (*)    Calcium 8.8 (*)    All other components within normal limits  CBC  BRAIN NATRIURETIC PEPTIDE    EKG EKG Interpretation  Date/Time:  Monday February 07 2020 01:41:19 EDT Ventricular Rate:  68 PR Interval:    QRS Duration: 84 QT Interval:  402 QTC Calculation: 428 R Axis:   79 Text Interpretation: Sinus rhythm Confirmed by Randal Buba, April ([id number]) on 02/07/2020 1:44:17 AM   Radiology DG Chest Port 1 View  Result Date: 02/07/2020 CLINICAL DATA:  Cough short of breath and leg swelling EXAM: PORTABLE CHEST 1 VIEW COMPARISON:  08/28/2019 FINDINGS: Hyperinflated lungs with emphysematous disease and chronic bronchitic changes. No consolidation or effusion. Normal heart size. No pneumothorax. IMPRESSION: No active disease.  Emphysematous disease and chronic bronchitic changes Electronically Signed   By: Donavan Foil M.D.   On: 02/07/2020 01:55    Procedures Procedures (including critical care time)  Medications Ordered in ED Medications  AeroChamber Plus Flo-Vu Large MISC (has no administration in time range)  albuterol (VENTOLIN HFA) 108 (90 Base) MCG/ACT inhaler 8 puff (8 puffs Inhalation Given 02/07/20 0145)  AeroChamber Plus Flo-Vu Large MISC 1 each (1 each Other Given 02/07/20 0146)    ED Course  I have reviewed the triage vital signs and the nursing notes.  Pertinent labs & imaging results that were available during my care of the patient were reviewed by me and considered in my medical decision making (see chart for details).    MDM Rules/Calculators/A&P                       Patient presents with bilateral foot swelling.  1+ edema noted.  EKG without ischemia. Chest x-ray without evidence of pulmonary edema. I personally evaluated these images.  BNP is not elevated.  Doubt foot swelling is secondary to congestive heart failure and is more likely secondary to diet.  Small lesion noted to the lateral side of the right ankle with mild erythema and increased warmth.  Will treat with doxycycline for early cellulitis.  Discussed importance of  close follow-up with primary care physician if patient continues to have peripheral edema.  Patient states understanding and is in agreement with the plan.   Final Clinical Impression(s) / ED Diagnoses Final diagnoses:  Cellulitis of right lower extremity  Peripheral edema    Rx / DC Orders ED Discharge Orders         Ordered    doxycycline (VIBRAMYCIN) 100 MG capsule  2 times daily     02/07/20 0218           Trenell Moxey, Gwenlyn Perking 02/07/20 0220    Palumbo, April, MD 02/07/20 [id number]

## 2020-02-07 NOTE — Discharge Instructions (Addendum)
1. Medications: doxycycline, usual home medications 2. Treatment: rest, drink plenty of fluids, reduce salt in your diet 3. Follow Up: Please followup with your primary doctor in 2-3 days for discussion of your diagnoses and further evaluation after today's visit; if you do not have a primary care doctor use the resource guide provided to find one; Please return to the ER for high fevers, worsening shortness of breath, spreading redness or other concerns.

## 2020-03-01 ENCOUNTER — Ambulatory Visit (INDEPENDENT_AMBULATORY_CARE_PROVIDER_SITE_OTHER): Payer: Medicaid Other | Admitting: Internal Medicine

## 2020-03-01 ENCOUNTER — Encounter: Payer: Self-pay | Admitting: Internal Medicine

## 2020-03-01 VITALS — BP 112/72 | HR 65 | Temp 98.1°F | Ht 71.0 in | Wt 164.0 lb

## 2020-03-01 DIAGNOSIS — R140 Abdominal distension (gaseous): Secondary | ICD-10-CM | POA: Diagnosis not present

## 2020-03-01 DIAGNOSIS — Z86010 Personal history of colonic polyps: Secondary | ICD-10-CM | POA: Diagnosis not present

## 2020-03-01 NOTE — Progress Notes (Signed)
HISTORY OF PRESENT ILLNESS:  Tony Sullivan is a 59 y.o. male with oxygen dependent COPD, degenerative arthritis-including the spine, hypertension, GERD, with a history of colon polyps.  He presents today regarding a chief complaint of constant abdominal bloating discomfort upon wakening.  He has been evaluated for this problem, in this office, multiple occasions.  Please see those dictations.  As part of the work-up he has undergone plain films of the abdomen, abdominal ultrasound, CT scan of the abdomen and pelvis, and complete colonoscopy.  Aside from small colon polyps which were removed, no relevant abnormalities.  His symptoms have been felt to be secondary to his advanced lung disease, as is well recognized as a clinical sequelae in some patients.  He states that his problem is ongoing.  He denies nausea or vomiting.  Symptoms are not made worse with meals.  He moves his bowels daily and is not constipated.  He did undergo neck surgery few months back.  Currently not ambulating.  REVIEW OF SYSTEMS:  All non-GI ROS negative unless otherwise stated in the HPI except for arthritis, numbness lower leg  Past Medical History:  Diagnosis Date  . Arthritis   . COPD (chronic obstructive pulmonary disease) ([place]) 2004   diagnosed in 2004, no PFT's to date.  Started on home O2 12/2013, after found to be desatting at PCP's office, and referred to pulmonology.  . Depression   . GERD (gastroesophageal reflux disease)   . High blood pressure   . Seasonal allergies   . Tobacco dependence     Past Surgical History:  Procedure Laterality Date  . ANTERIOR CERVICAL DECOMP/DISCECTOMY FUSION N/A 09/06/2019   Procedure: Anterior Cervical Discecectomy Fusion - Cerivcal Five-Cervical Six;  Surgeon: Kary Kos, MD;  Location: [facility];  Service: Neurosurgery;  Laterality: N/A;  Anterior Cervical Discecectomy Fusion - Cerivcal Five-Cervical Six  . ANTERIOR CERVICAL DISCECTOMY  09/06/2019  . APPENDECTOMY  1980  .  COLONOSCOPY WITH PROPOFOL N/A 12/10/2016   Procedure: COLONOSCOPY WITH PROPOFOL;  Surgeon: [name], MD;  Location: [facility];  Service: Endoscopy;  Laterality: N/A;  . HIP SURGERY Right   . SHOULDER ARTHROSCOPY Right     Social History Malikye Reppond Yeldell  reports that he has been smoking cigarettes. He has been smoking about 0.00 packs per day for the past 36.00 years. He has never used smokeless tobacco. He reports previous alcohol use of about 14.0 - 21.0 standard drinks of alcohol per week. He reports previous drug use. Drug: Marijuana.  family history includes Allergies in his father, mother, and son; Asthma in his mother; Cancer in his mother; Clotting disorder in his mother; Emphysema in his father and mother; Heart disease in his maternal aunt and mother.  Allergies  Allergen Reactions  . Penicillins Anaphylaxis    Has patient had a PCN reaction causing immediate rash, facial/tongue/throat swelling, SOB or lightheadedness with hypotension: Yes Has patient had a PCN reaction causing severe rash involving mucus membranes or skin necrosis: No Has patient had a PCN reaction that required hospitalization No Has patient had a PCN reaction occurring within the last 10 years: No If all of the above answers are "NO", then may proceed with Cephalosporin use.   . Sulfa Antibiotics Swelling    Swelling of hands and throat   . Famotidine Other (See Comments)    Bloating of stomach, loss of appetite  . Levocetirizine Other (See Comments)    Muscle cramps       PHYSICAL EXAMINATION:  Vital signs: BP 112/72   Pulse 65   Temp 98.1 F (36.7 C)   Ht 5\' 11"  (1.803 m)   Wt 164 lb (74.4 kg)   SpO2 97% Comment: on oxgyen  BMI 22.87 kg/m   Constitutional: Chronically ill, in wheelchair, nasal cannula oxygen in place, no acute distress Psychiatric: alert and oriented x3, cooperative Eyes: extraocular movements intact, anicteric, conjunctiva pink Mouth: oral pharynx moist, no lesions Neck:  supple no lymphadenopathy Cardiovascular: heart regular rate and rhythm, no murmur Lungs: clear to auscultation bilaterally but with distant breath sounds Abdomen: soft, nontender, nondistended, no obvious ascites, no peritoneal signs, normal bowel sounds, no organomegaly Rectal: Omitted Extremities: no clubbing or cyanosis.  Trace right lower extremity edema bilaterally Skin: no lesions on visible extremities Neuro: No focal deficits.  Cranial nerves intact  ASSESSMENT:  1.  Subjective complaints of abdominal distention and associated discomfort as a consequence of chronic advanced lung disease.  Negative extensive work-up otherwise. 2.  History of GERD.  Asymptomatic on PPI 3.  History of adenomatous colon polyps February 2018.  Patient is not a candidate for surveillance colonoscopy given his advanced lung disease 4.  Advanced lung disease.  Progressing   PLAN:  1.  Reassurance regarding his abdominal complaint and lack of primary GI etiology 2.  Continue PPI 3.  Okay to try over-the-counter gas reducing agents or agents to induce belching such as Alka-Seltzer, to see if this helps. 4.  No surveillance colonoscopy in the future as above 5.  Ongoing general medical care with pulmonary 6.  GI follow-up as needed

## 2020-03-01 NOTE — Patient Instructions (Signed)
Please follow up as needed 

## 2020-03-06 ENCOUNTER — Telehealth: Payer: Self-pay | Admitting: Internal Medicine

## 2020-03-06 MED ORDER — AZITHROMYCIN 250 MG PO TABS: ORAL_TABLET | ORAL | 0 refills | Status: AC

## 2020-03-06 NOTE — Telephone Encounter (Signed)
Patient need refill of zpack . Per Dr. [name]  Notes could have rx to have on hold for zpack   Complains of increased cough/congestion x 2 days  . Has ov in 2 weeks .  Advised to follow-up as planned if symptoms not improving with Z-Pak to call us sooner for a work in visit  Please contact office for sooner follow up if symptoms do not improve or worsen or seek emergency care    Sent to Dr. [name] for [facility]

## 2020-03-08 ENCOUNTER — Other Ambulatory Visit: Payer: Self-pay | Admitting: Internal Medicine

## 2020-03-08 NOTE — Progress Notes (Signed)
Date:  03/10/2020   ID:  [name], DOB 11-13-1960, MRN [id number]  PCP:  [name], FNP  Cardiologist:  Rex Kras, DO, [facility] (established care 03/10/2020)  REASON FOR CONSULT: Lower extremity swelling  REQUESTING PHYSICIAN:  [name], [address],  [place] [id number]  Chief Complaint  Patient presents with  . Edema  . New Patient (Initial Visit)    HPI  Tony Sullivan is a 59 y.o. male who is being seen today for the evaluation of lower extremity swelling at the request of [name], FNP. Patient's past medical history and cardiac risk factors include: COPD on home O2, prediabetes, active smoking, hypertension, abnormal ABIs suggestive of peripheral vascular disease.  Patient is unaccompanied at today's office visit.  He presents to the office for evaluation of lower extremity swelling.  Patient states that lower extremity swelling has been going on for the last 9 months and it is intermittent.  He notes that his right lower extremity is at times more swollen than the left mostly localized at the ankle.  Patient is unsure if the swelling improves when he wakes up in the morning.  Unable to comment on paroxysmal nocturnal dyspnea as he currently has his head of the bed elevated at 30 degrees since 2019.  He has 3 pillow orthopnea which is chronic and stable.   Given his underlying COPD patient uses oxygen with activity anywhere from 3 to 4 L.  No prior history of congestive heart failure or established coronary artery disease.  Clinically patient is euvolemic and not in congestive heart failure.  Denies prior history of coronary artery disease, myocardial infarction, congestive heart failure, deep venous thrombosis, pulmonary embolism, stroke, transient ischemic attack.  FUNCTIONAL STATUS: No structured exercise program or daily routine. Presents to the office in motorized wheelchair due to back pain.   ALLERGIES: Allergies    Allergen Reactions  . Penicillins Anaphylaxis    Has patient had a PCN reaction causing immediate rash, facial/tongue/throat swelling, SOB or lightheadedness with hypotension: Yes Has patient had a PCN reaction causing severe rash involving mucus membranes or skin necrosis: No Has patient had a PCN reaction that required hospitalization No Has patient had a PCN reaction occurring within the last 10 years: No If all of the above answers are "NO", then may proceed with Cephalosporin use.   . Sulfa Antibiotics Swelling    Swelling of hands and throat   . Famotidine Other (See Comments)    Bloating of stomach, loss of appetite  . Levocetirizine Other (See Comments)    Muscle cramps    MEDICATION LIST PRIOR TO VISIT: Current Meds  Medication Sig  . albuterol (PROAIR HFA) 108 (90 Base) MCG/ACT inhaler Inhale 2 puffs into the lungs every 4 (four) hours as needed. INHALE TWO PUFFS UP TO EVERY 4 HOURS IF NEEDED FOR SHORT OF BREATH  . albuterol (PROVENTIL) (2.5 MG/3ML) 0.083% nebulizer solution Take 3 mLs (2.5 mg total) by nebulization every 4 (four) hours as needed for wheezing or shortness of breath.  Marland Kitchen azithromycin (ZITHROMAX Z-PAK) 250 MG tablet Take 2 tablets (500 mg) on  Day 1,  followed by 1 tablet (250 mg) once daily on Days 2 through 5.  . benztropine (COGENTIN) 0.5 MG tablet Take 0.5 mg by mouth at bedtime.   . budesonide-formoterol (SYMBICORT) 160-4.5 MCG/ACT inhaler INHALE TWO PUFFS BY MOUTH INTO THE LUNGS TWICE A DAY  . cetirizine (ZYRTEC) 10 MG tablet  Take 10 mg by mouth daily.  . famotidine (PEPCID) 20 MG tablet TAKE ONE TABLET BY MOUTH DAILY AT BEDTIME  . fluticasone (FLONASE) 50 MCG/ACT nasal spray Place 2 sprays into both nostrils 3 (three) times daily as needed for allergies.   Marland Kitchen latanoprost (XALATAN) 0.005 % ophthalmic solution Place 1 drop into both eyes at bedtime.  Marland Kitchen losartan (COZAAR) 100 MG tablet Take 100 mg by mouth daily.  Marland Kitchen loxapine (LOXITANE) 10 MG capsule Take 10 mg  by mouth at bedtime.   . mirtazapine (REMERON) 30 MG tablet Take 30 mg by mouth at bedtime.  . Multiple Vitamin (MULTIVITAMIN) capsule Take 1 capsule by mouth daily.  . OXYGEN Inhale 2 L into the lungs as directed. 2-3 liters with sleep and exertion as needed for low oxygen  . pantoprazole (PROTONIX) 40 MG tablet Take 1 tablet (40 mg total) by mouth daily. Take 30-60 min before first meal of the day  . polyethylene glycol (MIRALAX / GLYCOLAX) 17 g packet TAKE 17 GRAMS BY MOUTH DAILY  . predniSONE (DELTASONE) 10 MG tablet TAKE ONE TO TWO TABLETS BY MOUTH DAILY WITH BREAKFAST  . Respiratory Therapy Supplies (FLUTTER) DEVI Use as directed  . risperiDONE (RISPERDAL) 2 MG tablet Take 2 mg by mouth at bedtime.  Marland Kitchen SPIRIVA RESPIMAT 2.5 MCG/ACT AERS INHALE TWO PUFFS BY MOUTH EVERY MORNING  . [DISCONTINUED] amLODipine (NORVASC) 10 MG tablet Take 10 mg by mouth daily.   . [DISCONTINUED] docusate sodium (COLACE) 100 MG capsule Take 100 mg by mouth daily.      PAST MEDICAL HISTORY: Past Medical History:  Diagnosis Date  . Arthritis   . COPD (chronic obstructive pulmonary disease) ([place]) 2004   diagnosed in 2004, no PFT's to date.  Started on home O2 12/2013, after found to be desatting at PCP's office, and referred to pulmonology.  . Depression   . GERD (gastroesophageal reflux disease)   . High blood pressure   . Prediabetes   . Seasonal allergies   . Tobacco dependence     PAST SURGICAL HISTORY: Past Surgical History:  Procedure Laterality Date  . ANTERIOR CERVICAL DECOMP/DISCECTOMY FUSION N/A 09/06/2019   Procedure: Anterior Cervical Discecectomy Fusion - Cerivcal Five-Cervical Six;  Surgeon: Kary Kos, MD;  Location: [facility];  Service: Neurosurgery;  Laterality: N/A;  Anterior Cervical Discecectomy Fusion - Cerivcal Five-Cervical Six  . ANTERIOR CERVICAL DISCECTOMY  09/06/2019  . APPENDECTOMY  1980  . COLONOSCOPY WITH PROPOFOL N/A 12/10/2016   Procedure: COLONOSCOPY WITH PROPOFOL;  Surgeon:  [name], MD;  Location: [facility];  Service: Endoscopy;  Laterality: N/A;  . HIP SURGERY Right   . SHOULDER ARTHROSCOPY Right     FAMILY HISTORY: The patient family history includes Allergies in his father, mother, and son; Asthma in his mother; Cancer in his mother; Clotting disorder in his mother; Diabetes in his brother; Emphysema in his father and mother; Heart disease in his mother; Seizures in his brother.  SOCIAL HISTORY:  The patient  reports that he has been smoking cigarettes. He has been smoking about 0.00 packs per day for the past 36.00 years. He has never used smokeless tobacco. He reports previous alcohol use of about 14.0 - 21.0 standard drinks of alcohol per week. He reports previous drug use. Drugs: Marijuana and Cocaine.  REVIEW OF SYSTEMS: Review of Systems  Constitution: Negative for chills and fever.  HENT: Negative for hoarse voice and nosebleeds.   Eyes: Negative for discharge, double vision and pain.  Cardiovascular:  Positive for leg swelling (localized to ankle.). Negative for chest pain, claudication, dyspnea on exertion, near-syncope, orthopnea, palpitations, paroxysmal nocturnal dyspnea and syncope.  Respiratory: Negative for hemoptysis and shortness of breath.   Musculoskeletal: Negative for muscle cramps and myalgias.  Gastrointestinal: Negative for abdominal pain, constipation, diarrhea, hematemesis, hematochezia, melena, nausea and vomiting.  Neurological: Negative for dizziness and light-headedness.    PHYSICAL EXAM: Vitals with BMI 03/10/2020 03/01/2020 02/07/2020  Height 5\' 11"  5\' 11"  -  Weight 154 lbs 164 lbs -  BMI 41.32 44.01 -  Systolic 027 253 664  Diastolic 79 72 91  Pulse 93 65 83    CONSTITUTIONAL: Well-developed and well-nourished. No acute distress.  SKIN: Skin is warm and dry. No rash noted. No cyanosis. No pallor. No jaundice HEAD: Normocephalic and atraumatic.  EYES: No scleral icterus MOUTH/THROAT: Moist oral membranes. On  nasal canula oxygen.  NECK: No JVD present. No thyromegaly noted. No carotid bruits  LYMPHATIC: No visible cervical adenopathy.  CHEST Normal respiratory effort. No intercostal retractions  LUNGS: Decreased breath sounds at the bases.  No stridor. No wheezes. No rales.  CARDIOVASCULAR: Regular, positive Q0-H4, soft systolic murmur heard over the left sternal border, no gallops or rubs. ABDOMINAL: No apparent ascites.  EXTREMITIES: No peripheral edema.  Faint bilateral posterior tibial pulses and 2+ bilateral dorsalis pedis pulses. HEMATOLOGIC: No significant bruising NEUROLOGIC: Oriented to person, place, and time. Nonfocal. Normal muscle tone.  PSYCHIATRIC: Normal mood and affect. Normal behavior. Cooperative  CARDIAC DATABASE: EKG: 03/10/2020: Normal sinus rhythm, 90 bpm, right atrial enlargement, anteroseptal infarct, without underlying injury pattern.  Echocardiogram: None  Stress Testing: None  Heart Catheterization: None  Vascular imaging: Lower Extremity 07/02/2019: Right: Resting right ankle-brachial index is within normal range. No evidence of significant right lower extremity arterial disease. Left: Resting left ankle-brachial index indicates moderate left lower extremity arterial disease.   LABORATORY DATA: CBC Latest Ref Rng & Units 02/06/2020 08/28/2019 08/28/2019  WBC 4.0 - 10.5 K/uL 10.5 15.0(H) 18.8(H)  Hemoglobin 13.0 - 17.0 g/dL 14.5 14.0 13.9  Hematocrit 39.0 - 52.0 % 47.2 43.3 42.9  Platelets 150 - 400 K/uL 357 339 346    CMP Latest Ref Rng & Units 02/06/2020 08/28/2019 08/28/2019  Glucose 70 - 99 mg/dL 138(H) 96 83  BUN 6 - 20 mg/dL 14 13 11   Creatinine 0.61 - 1.24 mg/dL 1.13 0.88 0.85  Sodium 135 - 145 mmol/L 140 140 141  Potassium 3.5 - 5.1 mmol/L 4.1 4.1 3.9  Chloride 98 - 111 mmol/L 107 101 102  CO2 22 - 32 mmol/L 24 26 27   Calcium 8.9 - 10.3 mg/dL 8.8(L) 9.5 9.4  Total Protein 6.5 - 8.1 g/dL - - 6.5  Total Bilirubin 0.3 - 1.2 mg/dL - - 1.0  Alkaline  Phos 38 - 126 U/L - - 149(H)  AST 15 - 41 U/L - - 20  ALT 0 - 44 U/L - - 21    Lipid Panel  No results found for: CHOL, TRIG, HDL, CHOLHDL, VLDL, LDLCALC, LDLDIRECT, LABVLDL  No results found for: HGBA1C No components found for: NTPROBNP No results found for: TSH  BMP Recent Labs    08/28/19 0001 08/28/19 0509 02/06/20 2001  NA 141 140 140  K 3.9 4.1 4.1  CL 102 101 107  CO2 27 26 24   GLUCOSE 83 96 138*  BUN 11 13 14   CREATININE 0.85 0.88 1.13  CALCIUM 9.4 9.5 8.8*  GFRNONAA >60 >60 >60  GFRAA >60 >60 >60  CBC No results for input(s): WBC, RBC, HGB, HCT, PLT, MCV, MCH, MCHC, RDW, LYMPHSABS, MONOABS, EOSABS, BASOSABS in the last 168 hours.  Invalid input(s): NEUTRABS  HEMOGLOBIN A1C No results found for: HGBA1C, MPG  Cardiac Panel (last 3 results) No results for input(s): CKTOTAL, CKMB, TROPONINI, RELINDX in the last 8760 hours. No results for input(s): TROPIPOC in the last 8760 hours.  BNP    Component Value Date/Time   BNP 20.4 02/06/2020 2001   ProBNP    Component Value Date/Time   PROBNP 29.2 07/09/2014 0955   TSH No results for input(s): TSH in the last 8760 hours.  CHOLESTEROL No results for input(s): CHOL in the last 8760 hours.  Hepatic Function Panel Recent Labs    07/02/19 1132 08/28/19 0001  PROT 6.7 6.5  ALBUMIN 3.4* 3.4*  AST 24 20  ALT 27 21  ALKPHOS 111 149*  BILITOT 0.4 1.0    IMPRESSION:    ICD-10-CM   1. Leg swelling  M79.89 EKG 12-Lead    PCV ECHOCARDIOGRAM COMPLETE    Basic metabolic panel    Magnesium    Magnesium    Basic metabolic panel  2. Essential hypertension  I10 PCV ECHOCARDIOGRAM COMPLETE    hydrochlorothiazide (MICROZIDE) 12.5 MG capsule    Basic metabolic panel    Magnesium    Magnesium    Basic metabolic panel  3. Smoker  F17.200   4. Chronic respiratory failure with hypoxia (HCC)  J96.11      RECOMMENDATIONS: Tony Sullivan is a 59 y.o. male whose past medical history and cardiac risk factors  include: COPD on home O2, prediabetes, active smoking, hypertension, abnormal ABIs suggestive of peripheral vascular disease.  Lower extremity swelling:  Patient swelling is mostly localized to his ankles bilaterally.  He is not in congestive heart failure clinically.  Discontinue amlodipine as this can contribute to lower extremity swelling.  We will transition him to hydrochlorothiazide 12.5 mg p.o. every morning.  BMP in 1 week to reevaluate kidney function and electrolyte.  Echocardiogram will be ordered to evaluate for structural heart disease and left ventricular systolic function.  Benign essential hypertension: Currently managed by primary team.  Blood pressure currently at goal.  Cigarette smoker: Educated on importance of complete smoking cessation.  Patient is scheduled for a follow-up with his primary care provider recommended that he have a fasting lipid profile.  FINAL MEDICATION LIST END OF ENCOUNTER: Meds ordered this encounter  Medications  . hydrochlorothiazide (MICROZIDE) 12.5 MG capsule    Sig: Take 1 capsule (12.5 mg total) by mouth in the morning.    Dispense:  90 capsule    Refill:  0    Medications Discontinued During This Encounter  Medication Reason  . docusate sodium (COLACE) 100 MG capsule Patient Preference  . amLODipine (NORVASC) 10 MG tablet Change in therapy     Current Outpatient Medications:  .  albuterol (PROAIR HFA) 108 (90 Base) MCG/ACT inhaler, Inhale 2 puffs into the lungs every 4 (four) hours as needed. INHALE TWO PUFFS UP TO EVERY 4 HOURS IF NEEDED FOR SHORT OF BREATH, Disp: 18 g, Rfl: 3 .  albuterol (PROVENTIL) (2.5 MG/3ML) 0.083% nebulizer solution, Take 3 mLs (2.5 mg total) by nebulization every 4 (four) hours as needed for wheezing or shortness of breath., Disp: 75 mL, Rfl: 3 .  azithromycin (ZITHROMAX Z-PAK) 250 MG tablet, Take 2 tablets (500 mg) on  Day 1,  followed by 1 tablet (250 mg) once daily on Days 2  through 5., Disp: 6 each,  Rfl: 0 .  benztropine (COGENTIN) 0.5 MG tablet, Take 0.5 mg by mouth at bedtime. , Disp: , Rfl:  .  budesonide-formoterol (SYMBICORT) 160-4.5 MCG/ACT inhaler, INHALE TWO PUFFS BY MOUTH INTO THE LUNGS TWICE A DAY, Disp: 1 Inhaler, Rfl: 3 .  cetirizine (ZYRTEC) 10 MG tablet, Take 10 mg by mouth daily., Disp: , Rfl:  .  famotidine (PEPCID) 20 MG tablet, TAKE ONE TABLET BY MOUTH DAILY AT BEDTIME, Disp: 30 tablet, Rfl: 2 .  fluticasone (FLONASE) 50 MCG/ACT nasal spray, Place 2 sprays into both nostrils 3 (three) times daily as needed for allergies. , Disp: , Rfl:  .  latanoprost (XALATAN) 0.005 % ophthalmic solution, Place 1 drop into both eyes at bedtime., Disp: , Rfl:  .  losartan (COZAAR) 100 MG tablet, Take 100 mg by mouth daily., Disp: , Rfl:  .  loxapine (LOXITANE) 10 MG capsule, Take 10 mg by mouth at bedtime. , Disp: , Rfl:  .  mirtazapine (REMERON) 30 MG tablet, Take 30 mg by mouth at bedtime., Disp: , Rfl:  .  Multiple Vitamin (MULTIVITAMIN) capsule, Take 1 capsule by mouth daily., Disp: , Rfl:  .  OXYGEN, Inhale 2 L into the lungs as directed. 2-3 liters with sleep and exertion as needed for low oxygen, Disp: , Rfl:  .  pantoprazole (PROTONIX) 40 MG tablet, Take 1 tablet (40 mg total) by mouth daily. Take 30-60 min before first meal of the day, Disp: 30 tablet, Rfl: 2 .  polyethylene glycol (MIRALAX / GLYCOLAX) 17 g packet, TAKE 17 GRAMS BY MOUTH DAILY, Disp: 14 each, Rfl: 1 .  predniSONE (DELTASONE) 10 MG tablet, TAKE ONE TO TWO TABLETS BY MOUTH DAILY WITH BREAKFAST, Disp: 60 tablet, Rfl: 2 .  Respiratory Therapy Supplies (FLUTTER) DEVI, Use as directed, Disp: 1 each, Rfl: 0 .  risperiDONE (RISPERDAL) 2 MG tablet, Take 2 mg by mouth at bedtime., Disp: , Rfl:  .  SPIRIVA RESPIMAT 2.5 MCG/ACT AERS, INHALE TWO PUFFS BY MOUTH EVERY MORNING, Disp: 4 g, Rfl: 6 .  doxycycline (VIBRAMYCIN) 100 MG capsule, Take 1 capsule (100 mg total) by mouth 2 (two) times daily. (Patient not taking: Reported on  03/10/2020), Disp: 20 capsule, Rfl: 0 .  hydrochlorothiazide (MICROZIDE) 12.5 MG capsule, Take 1 capsule (12.5 mg total) by mouth in the morning., Disp: 90 capsule, Rfl: 0  Orders Placed This Encounter  Procedures  . Basic metabolic panel  . Magnesium  . EKG 12-Lead  . PCV ECHOCARDIOGRAM COMPLETE    There are no Patient Instructions on file for this visit.   --Continue cardiac medications as reconciled in final medication list. --Return in about 6 weeks (around 04/21/2020) for review test results., re-evaluation of symptoms.. Or sooner if needed. --Continue follow-up with your primary care physician regarding the management of your other chronic comorbid conditions.  Patient's questions and concerns were addressed to his satisfaction. He voices understanding of the instructions provided during this encounter.   This note was created using a voice recognition software as a result there may be grammatical errors inadvertently enclosed that do not reflect the nature of this encounter. Every attempt is made to correct such errors.  Rex Kras, [place], [facility]  Pager: [phone] Office: [phone]

## 2020-03-10 ENCOUNTER — Ambulatory Visit: Payer: Medicaid Other | Admitting: Cardiology

## 2020-03-10 ENCOUNTER — Encounter: Payer: Self-pay | Admitting: Cardiology

## 2020-03-10 ENCOUNTER — Other Ambulatory Visit: Payer: Self-pay

## 2020-03-10 VITALS — BP 124/79 | HR 93 | Ht 71.0 in | Wt 154.0 lb

## 2020-03-10 DIAGNOSIS — J9611 Chronic respiratory failure with hypoxia: Secondary | ICD-10-CM

## 2020-03-10 DIAGNOSIS — I10 Essential (primary) hypertension: Secondary | ICD-10-CM

## 2020-03-10 DIAGNOSIS — F17200 Nicotine dependence, unspecified, uncomplicated: Secondary | ICD-10-CM

## 2020-03-10 DIAGNOSIS — M7989 Other specified soft tissue disorders: Secondary | ICD-10-CM

## 2020-03-10 MED ORDER — HYDROCHLOROTHIAZIDE 12.5 MG PO CAPS: 12.5000 mg | ORAL_CAPSULE | Freq: Every morning | ORAL | 0 refills | Status: DC

## 2020-03-16 ENCOUNTER — Other Ambulatory Visit: Payer: Self-pay

## 2020-03-16 ENCOUNTER — Ambulatory Visit: Payer: Medicaid Other

## 2020-03-16 DIAGNOSIS — M7989 Other specified soft tissue disorders: Secondary | ICD-10-CM

## 2020-03-16 DIAGNOSIS — I10 Essential (primary) hypertension: Secondary | ICD-10-CM

## 2020-03-18 LAB — BASIC METABOLIC PANEL
BUN/Creatinine Ratio: 12 (ref 9–20)
BUN: 11 mg/dL (ref 6–24)
CO2: 27 mmol/L (ref 20–29)
Calcium: 9.5 mg/dL (ref 8.7–10.2)
Chloride: 103 mmol/L (ref 96–106)
Creatinine, Ser: 0.94 mg/dL (ref 0.76–1.27)
GFR calc Af Amer: 104 mL/min/{1.73_m2} (ref >59)
GFR calc non Af Amer: 90 mL/min/{1.73_m2} (ref >59)
Glucose: 132 mg/dL — ABNORMAL HIGH (ref 65–99)
Potassium: 4 mmol/L (ref 3.5–5.2)
Sodium: 144 mmol/L (ref 134–144)

## 2020-03-18 LAB — MAGNESIUM: Magnesium: 2.3 mg/dL (ref 1.6–2.3)

## 2020-03-20 ENCOUNTER — Other Ambulatory Visit: Payer: Self-pay

## 2020-03-20 ENCOUNTER — Telehealth: Payer: Self-pay

## 2020-03-20 ENCOUNTER — Encounter: Payer: Self-pay | Admitting: Internal Medicine

## 2020-03-20 ENCOUNTER — Ambulatory Visit (INDEPENDENT_AMBULATORY_CARE_PROVIDER_SITE_OTHER): Payer: Medicaid Other | Admitting: Internal Medicine

## 2020-03-20 DIAGNOSIS — J449 Chronic obstructive pulmonary disease, unspecified: Secondary | ICD-10-CM | POA: Diagnosis not present

## 2020-03-20 DIAGNOSIS — J9611 Chronic respiratory failure with hypoxia: Secondary | ICD-10-CM | POA: Diagnosis not present

## 2020-03-20 DIAGNOSIS — F17210 Nicotine dependence, cigarettes, uncomplicated: Secondary | ICD-10-CM | POA: Diagnosis not present

## 2020-03-20 MED ORDER — PREDNISONE 10 MG PO TABS: 10.0000 mg | ORAL_TABLET | Freq: Every day | ORAL | 0 refills | Status: DC

## 2020-03-20 NOTE — Telephone Encounter (Signed)
Pt called to inform us that he had blurry vision. Pt mention he spoke to the doctor on call this weekend. And he inform him to stop his HCTZ. Pt would like to know when would he start it again.

## 2020-03-20 NOTE — Progress Notes (Signed)
Subjective:   Patient ID: Tony Sullivan, male    DOB: 02-Apr-1961  MRN: [id number]    Brief patient profile:  52  yobm active smoker with breathing difficulties since 2004 dx as copd on advair 250 /50 but getting worse since 2014 and placed on 02 for the first time in March 2015 and referred 01/07/2014 to pulmonary clinic by Dr Kennon Holter with GOLD II copd criteria established 02/14/14    History of Present Illness  01/07/2014 1st [facility] office visit/ Teighan Aubert  Chief Complaint  Patient presents with  . Advice Only    Referred for COPD, SOB.  Pt c/o SOB, low 02, prod cough with white mucous.  Pt forgot med list, unsure of what all he is taking  still can do a [employer] but uses [facility] parking, assoc with congested coughing esp in am worse in winter. rec Stop advair   Plan A =  Automatic =  symbicort 160 Take 2 puffs first thing in am and then another 2 puffs about 12 hours later. spiriva in am only  Plan B = Backup- Proventil use it only if you can't your breath - ok to use up to every 4 hours if needed Plan C = Nebulizer up to every 4 hours if needed only if you try plan B first and it doesn't wor   07/27/2018  f/u ov/Egbert Seidel re: copd/ did not bring med calendar or all meds/ still smoking / prednisone 20 mg daily  Chief Complaint  Patient presents with  . Follow-up    States his breathing is about the same but when it is hot it gets much worse.   Dyspnea:  Across room even on 02 sob unless uses nebs / MMRC4  = sob if tries to leave home or while getting dressed   Cough: clear / rattling Sleeping: < 30 degrees from multiple pillows SABA use: way over using 02: 02 2lpm   rec For cough / congestion > mucinex up to 1200 mg every 12 hours and use the flutter valve as much as you can Try off tudorza and start back on spiriva 2.5  X 2 pffs each am  Call if unable to get back into [facility] rehab  Please schedule a follow up office visit in 6 weeks, call sooner if needed with all  medications /inhalers/ solutions in hand    09/18/2018  f/u ov/Hosea Hanawalt re:  GOLD III copd / prednisone 20 mg / did not bring med cal as req maint on symb 160/spiriva smi Chief Complaint  Patient presents with  . Follow-up    Breathing is doing well. He is using his albuterol inhaler 4-5 per day because he states he has been walking more. He uses his neb 3 to 4 x per wk.    Dyspnea:  Can't do [employer] s stopping on 1lpm but doesn't check sats or titrate up  Cough: esp in am x  first hour but mostly white and < a tbsp or two produced  Sleeping: ok flat  SABA use: as above 02: sleeping on 1.5- 2lpm and never above 1lpm walking  rec Try prednisone 10 mg daily to see if you do just as well and if flares then double the dose until better Only use your albuterol as a rescue medication  Please use flutter valve in am to help with the morning congestion     12/21/2018  f/u ov/Danyla Wattley re:  GOLD III copd/ pred 30 mg daily with  floor 10 mg on symb 160/spiriva  Chief Complaint  Patient presents with  . Follow-up    Pt c/o sinus pressure off and on for the past wk. He is having some increased SOB this morning.    Dyspnea:  Can do shopping at grocery on 2lpm does not check sats  Cough: usual cough = rattling  Sleeping: bed is flat / pillows up to 30 degrees  SABA use: up to q 6 hours on neb  02: 2lpm  rec Plan A = Automatic = symbicort 160 Take 2 puffs first thing in am and then another 2 puffs about 12 hours later/spiriva 2.5  X 3 puffs each am  Work on inhaler technique Plan B = Backup Only use your albuterol inhaler as a rescue medication  Plan C = Crisis - only use your albuterol nebulizer if you first try Plan B and it fails to help > ok to use the nebulizer up to every 4 hours but if start needing it regularly call for immediate appointment Plan D = Deltasone  Take 2 daily until better then 1 daily    05/13/2019  f/u ov/Lizbeth Feijoo re:  COPD III copd and pred 20 mg bid  Chief Complaint  Patient  presents with  . Follow-up    Breathing has been progressively worse. He is still smoking. He is using his proair multiple x per day and neb daily.   Dyspnea:  Walking 2 blocks slow pace on 3-4 doesn't titrate  Cough: minimal esp am still clear  Sleeping: ok propped up 30 degrees  SABA use: way over using as above  02: no using it sitting , up to 4lpm walking, 2.5 hs  rec 02 is 2.5 lpm at bedtime and up to 4lpm POC walking   09/20/2019 televist rec Plan A = Automatic = Always=    symbicort and spiriva Plan B = Backup (to supplement plan A, not to replace it) Only use your albuterol inhaler as a rescue medication  Plan C = Crisis (instead of Plan B but only if Plan B stops working) - only use your albuterol nebulizer if you first try Plan B and it fails to help > ok to use the nebulizer up to every 4 hours but if start needing it regularly call for immediate appointment Plan D = Deltasone - if you need more albuterol especially the nebulizer to get thu the day then take prednisone 10 mg x 2 each am until better then return to 1 day    03/20/2020  f/u ov/Arva Slaugh re:  GOLD III/ prednisone @ 20 mg per day  Chief Complaint  Patient presents with  . Follow-up    pt is having sob and using rescue inhakler 5 to 6 times a week  Dyspnea:  Room to room despite 02  Cough: some in ams Sleeping: 30 degrees  SABA uses way too much  02: hs on 2.5  And 4lpm during the day, not titrating    No obvious day to day or daytime variability or assoc excess/ purulent sputum or mucus plugs or hemoptysis or cp or chest tightness, subjective wheeze or overt sinus or hb symptoms.   Sleeping as above without nocturnal  or early am exacerbation  of respiratory  c/o's or need for noct saba. Also denies any obvious fluctuation of symptoms with weather or environmental changes or other aggravating or alleviating factors except as outlined above   No unusual exposure hx or h/o childhood pna/ asthma or knowledge  of  premature birth.  Current Allergies, Complete Past Medical History, Past Surgical History, Family History, and Social History were reviewed in [employer] record.  ROS  The following are not active complaints unless bolded Hoarseness, sore throat, dysphagia, dental problems, itching, sneezing,  nasal congestion or discharge of excess mucus or purulent secretions, ear ache,   fever, chills, sweats, unintended wt loss or wt gain, classically pleuritic or exertional cp,  orthopnea pnd or arm/hand swelling  or leg swelling, presyncope, palpitations, abdominal pain, anorexia, nausea, vomiting, diarrhea  or change in bowel habits or change in bladder habits, change in stools or change in urine, dysuria, hematuria,  rash, arthralgias, visual complaints, headache, numbness, weakness or ataxia or problems with walking or coordination,  change in mood or  memory.        Current Meds  Medication Sig  . albuterol (PROAIR HFA) 108 (90 Base) MCG/ACT inhaler Inhale 2 puffs into the lungs every 4 (four) hours as needed. INHALE TWO PUFFS UP TO EVERY 4 HOURS IF NEEDED FOR SHORT OF BREATH  . albuterol (PROVENTIL) (2.5 MG/3ML) 0.083% nebulizer solution Take 3 mLs (2.5 mg total) by nebulization every 4 (four) hours as needed for wheezing or shortness of breath.  . benztropine (COGENTIN) 0.5 MG tablet Take 0.5 mg by mouth at bedtime.   . budesonide-formoterol (SYMBICORT) 160-4.5 MCG/ACT inhaler INHALE TWO PUFFS BY MOUTH INTO THE LUNGS TWICE A DAY  . famotidine (PEPCID) 20 MG tablet TAKE ONE TABLET BY MOUTH DAILY AT BEDTIME  . fluticasone (FLONASE) 50 MCG/ACT nasal spray Place 2 sprays into both nostrils 3 (three) times daily as needed for allergies.   . hydrochlorothiazide (MICROZIDE) 12.5 MG capsule Take 1 capsule (12.5 mg total) by mouth in the morning.  . latanoprost (XALATAN) 0.005 % ophthalmic solution Place 1 drop into both eyes at bedtime.  Marland Kitchen losartan (COZAAR) 100 MG tablet Take 100 mg by  mouth daily.  Marland Kitchen loxapine (LOXITANE) 10 MG capsule Take 10 mg by mouth at bedtime.   . mirtazapine (REMERON) 30 MG tablet Take 30 mg by mouth at bedtime.  . Multiple Vitamin (MULTIVITAMIN) capsule Take 1 capsule by mouth daily.  . OXYGEN Inhale 2 L into the lungs as directed. 2-3 liters with sleep and exertion as needed for low oxygen  . pantoprazole (PROTONIX) 40 MG tablet Take 1 tablet (40 mg total) by mouth daily. Take 30-60 min before first meal of the day  . polyethylene glycol (MIRALAX / GLYCOLAX) 17 g packet TAKE 17 GRAMS BY MOUTH DAILY  . predniSONE (DELTASONE) 10 MG tablet Take 10 mg by mouth daily with breakfast.  . Respiratory Therapy Supplies (FLUTTER) DEVI Use as directed  . risperiDONE (RISPERDAL) 2 MG tablet Take 2 mg by mouth at bedtime.  Marland Kitchen SPIRIVA RESPIMAT 2.5 MCG/ACT AERS INHALE TWO PUFFS BY MOUTH EVERY MORNING  . [DISCONTINUED] predniSONE (DELTASONE) 10 MG tablet TAKE ONE TO TWO TABLETS BY MOUTH DAILY WITH BREAKFAST                              Objective:   Physical Exam  Mildly hoarse w/c bound bm nad  03/20/2020   162 05/13/2019  159  12/21/2018    163      06/15/2014  164  >  09/15/2014 158 >   02/20/2015 >156 03/22/2015 >  07/31/2015   150 > 12/05/2015  161   > 04/01/2016    151 >  07/29/2016  152 >   02/25/2017  153 >  06/05/2017  153 > 11/03/2017   166 > 01/30/2018  167> 03/16/2018  168 > 06/10/2018  167 > 07/27/2018  169 > 09/18/2018 163 >       Vital signs reviewed  03/20/2020  - Note at rest 02 sats  91% on RA     HEENT : pt wearing mask not removed for exam due to covid -19 concerns.    NECK :  without JVD/Nodes/TM/ nl carotid upstrokes bilaterally   LUNGS: no acc muscle use,  Mod barrel  contour chest wall with bilateral  Distant bs s audible wheeze and  without cough on insp or exp maneuvers and mod  Hyperresonant  to  percussion bilaterally     CV:  RRR  no s3 or murmur or increase in P2, and no edema   ABD:  soft and nontender with pos mid insp Hoover's  in the  supine position. No bruits or organomegaly appreciated, bowel sounds nl  MS:     ext warm without deformities, calf tenderness, cyanosis or clubbing No obvious joint restrictions   SKIN: warm and dry without lesions    NEURO:  alert, approp, nl sensorium with  no motor or cerebellar deficits apparent.      I personally reviewed images and agree with radiology impression as follows:  pCXR:   02/07/20 No active disease. Emphysematous disease and chronic bronchitic changes

## 2020-03-20 NOTE — Patient Instructions (Signed)
Plan A = Automatic = Always=    symbicort and spiriva  Plan B = Backup (to supplement plan A, not to replace it) Only use your albuterol inhaler as a rescue medication to be used if you can't catch your breath by resting or doing a relaxed purse lip breathing pattern.  - The less you use it, the better it will work when you need it. - Ok to use the inhaler up to 2 puffs  every 4 hours if you must but call for appointment if use goes up over your usual need - Don't leave home without it !!  (think of it like the spare tire for your car)   Plan C = Crisis (instead of Plan B but only if Plan B stops working) - only use your albuterol nebulizer if you first try Plan B and it fails to help > ok to use the nebulizer up to every 4 hours but if start needing it regularly call for immediate appointment   Plan D = Deltasone - if you need more albuterol especially the nebulizer to get thu the day then take prednisone 10 mg x 2 each am until better then return to 1 day   Try albuterol 15 min (one day use the inhaler then next the nebulizer) before an activity that you know would normally  make you short of breath on adequate amount of oxygen ( and see if it makes any difference and if makes none then don't take it after activity unless you can't catch your breath.  The key is to stop smoking completely before smoking completely stops you!   Please schedule a follow up office visit in 4 weeks, sooner if needed

## 2020-03-21 ENCOUNTER — Encounter: Payer: Self-pay | Admitting: Internal Medicine

## 2020-03-21 NOTE — Assessment & Plan Note (Addendum)
Active smoker  - 02/14/2014   try symbicort 160 2bid  - PFT's  02/14/2014   FEV1 1.82 (53%) and ratio 47 no better p alb (p spiriva and symbicort 160)  and DLCO 32 with 37%  07/2014 >Spiriva stopped  09/15/2014 Med calendar> not using 02/20/2015   - Declined rehab 07/31/15  - Quit smoking 11/2016  - flutter valve 04/01/2016  -med calendar 08/20/16  - added prn pred to action plan 12/05/2016  - changed to maint pred 02/25/2017 with floor of 10 mg daily >>>  - 06/05/2017  After extensive coaching device effectiveness =    90% smi > change tudorza to spiriva 2.5 x 2 > unable to afford - 11/03/2017  After extensive coaching inhaler device  effectiveness =    90% with DPI/ tudorza  - add pepcid 20 mg at hs 01/30/2018 for noct/ am wheeze - Spirometry 03/16/2018  FEV1 1.16 (34%)  Ratio 57 p am symb/ tudorza - referred again to rehab 06/10/2018  And 07/27/2018  > declined 09/18/2018  - 07/27/2018  After extensive coaching inhaler device,  effectiveness =    90% with respimat  - 09/18/2018 flutter valve training and started daily pred with floor 10 mg  - 12/21/2018  After extensive coaching inhaler device,  effectiveness =    75% (short Ti)     Group D in terms of symptom/risk and laba/lama/ICS  therefore appropriate rx at this point >>>  Continue symb/spiriva  Re overuse of saba: I spent extra time with pt today reviewing appropriate use of albuterol for prn use on exertion with the following points: 1) saba is for relief of sob that does not improve by walking a slower pace or resting but rather if the pt does not improve after trying this first. 2) If the pt is convinced, as many are, that saba helps recover from activity faster then it's easy to tell if this is the case by re-challenging : ie stop, take the inhaler, then p 5 minutes try the exact same activity (intensity of workload) that just caused the symptoms and see if they are substantially diminished or not after saba 3) if there is an activity that  reproducibly causes the symptoms, try the saba 15 min before the activity on alternate days   If in fact the saba really does help, then fine to continue to use it prn but advised may need to look closer at the maintenance regimen being used to achieve better control of airways disease with exertion.  Re Prednisone rx The goal with a chronic steroid dependent illness is always arriving at the lowest effective dose that controls the disease/symptoms and not accepting a set "formula" which is based on statistics or guidelines that don't always take into account patient  variability or the natural hx of the dz in every individual patient, which may well vary over time.  For now therefore I recommend the patient maintain  Ceiling of 20 mg and floor of 10 mg daily as " Plan D"  see avs for instructions unique to this ov

## 2020-03-21 NOTE — Telephone Encounter (Signed)
Patient is aware and showed understanding to stop  if symptoms occur and to call the office.

## 2020-03-21 NOTE — Telephone Encounter (Signed)
He can start it again. If the symptoms return he can hold it and call the office.

## 2020-03-21 NOTE — Assessment & Plan Note (Signed)
Counseled re importance of smoking cessation but did not meet time criteria for separate billing           Each maintenance medication was reviewed in detail including emphasizing most importantly the difference between maintenance and prns and under what circumstances the prns are to be triggered using an action plan format where appropriate.  Total time for H and P, chart review, counseling, teaching device and generating customized AVS unique to this office visit / charting = 20 min       

## 2020-03-21 NOTE — Assessment & Plan Note (Signed)
First started on 27 December 2013 by [facility] clinic - 02/14/2014  Walked RA x 3/4  laps @ 185 ft each stopped due to  desat 85% and corrected on 3lpm to x 2 laps  - 06/15/2014  Walked 2.5lpm  2 laps  @ 185 ft each stopped due to  Sob adequate sats  - 02/20/2015  Walked RA x 3 laps @ 185 ft each stopped due to  Sob/desat p 2 laps, nl pace, eliminated on his 02 at 3lpm  - 12/05/2015   Walked RA  2 laps @ 185 ft each stopped due to  desat to 88% and corrected on 2lpm  - 03/16/2018   Walked 3lmpm  x one lap @ 185 stopped due to /desat  To 89% at relatively fast pace  - 06/10/2018   Walked RA x one lap @ 185 stopped due to  Sob / desats to 86% resolved on repeating walk on 2lpm but still sob p one lap  - 07/27/2018  Walked 2lpm continuous x 2 laps @ 185 ft each stopped due to  End of study, nl pace, no desats but stopped due to sob - 09/18/2018   Walked 0.5 lpm  x one lap @ 1=210 ft stopped due to  Sob s desats 05/13/2019 Patient Saturations on Room Air at Rest = 97% then while Ambulating = 88% Patient Saturations on 4 Liters of pulsed oxygen while Ambulating = 94%   rec as of  03/20/2020    2.5 lpm hs and adjust to keep > 90% walking

## 2020-03-22 ENCOUNTER — Ambulatory Visit: Payer: Medicaid Other | Attending: Student | Admitting: Physical Therapy

## 2020-03-22 ENCOUNTER — Encounter: Payer: Self-pay | Admitting: Physical Therapy

## 2020-03-22 ENCOUNTER — Other Ambulatory Visit: Payer: Self-pay

## 2020-03-22 DIAGNOSIS — R2689 Other abnormalities of gait and mobility: Secondary | ICD-10-CM | POA: Diagnosis present

## 2020-03-22 DIAGNOSIS — M6281 Muscle weakness (generalized): Secondary | ICD-10-CM | POA: Insufficient documentation

## 2020-03-22 NOTE — Therapy (Signed)
[place], [place], [address] Phone: [phone]   Fax:  [phone]  Physical Therapy Evaluation  Patient Details  Name: Tony Sullivan MRN: [id number] Date of Birth: 01/06/1961 Referring Provider (PT): Glenford Peers NP   Encounter Date: 03/22/2020  PT End of Session - 03/22/20 1345    Visit Number  1    Number of Visits  4    Date for PT Re-Evaluation  04/19/20    Authorization Type  MCD    Authorization Time Period  submitted for initial 3V    PT Start Time  7902    PT Stop Time  1435    PT Time Calculation (min)  50 min    Activity Tolerance  Patient limited by fatigue;Other (comment)   SOB and O2 sats   Behavior During Therapy  WFL for tasks assessed/performed       Past Medical History:  Diagnosis Date  . Arthritis   . COPD (chronic obstructive pulmonary disease) ([place]) 2004   diagnosed in 2004, no PFT's to date.  Started on home O2 12/2013, after found to be desatting at PCP's office, and referred to pulmonology.  . Depression   . GERD (gastroesophageal reflux disease)   . High blood pressure   . Prediabetes   . Seasonal allergies   . Tobacco dependence     Past Surgical History:  Procedure Laterality Date  . ANTERIOR CERVICAL DECOMP/DISCECTOMY FUSION N/A 09/06/2019   Procedure: Anterior Cervical Discecectomy Fusion - Cerivcal Five-Cervical Six;  Surgeon: Kary Kos, MD;  Location: [facility];  Service: Neurosurgery;  Laterality: N/A;  Anterior Cervical Discecectomy Fusion - Cerivcal Five-Cervical Six  . ANTERIOR CERVICAL DISCECTOMY  09/06/2019  . APPENDECTOMY  1980  . COLONOSCOPY WITH PROPOFOL N/A 12/10/2016   Procedure: COLONOSCOPY WITH PROPOFOL;  Surgeon: [name], MD;  [facility];  Service: Endoscopy;  Laterality: N/A;  . HIP SURGERY Right   . SHOULDER ARTHROSCOPY Right     There were no vitals filed for this visit.   Subjective Assessment - 03/22/20 1346     Subjective  Pt reports he started having a lot of complications with his body and stopped moving around and now he is having trouble with his Rt LE.  He crosses his legs a lot and they are wondering if it is from that. He has been in an electric WC for about 9 months after fx his back.  He had some HHPT after surgery.  Did some walking with a walker. He feels like his breathing limits him more than his strength    Pertinent History  uses oxygen to help with his breathing 2L, I with transfers    Diagnostic tests  Dr Orpah Melter going to test "something" on the 3rd    Patient Stated Goals  get legs and lungs stronger so he can walk again.    Currently in Pain?  No/denies         [facility] PT Assessment - 03/22/20 0001      Assessment   Medical Diagnosis  myelopathy Rt LE    Referring Provider (PT)  Glenford Peers NP    Onset Date/Surgical Date  06/23/19    Hand Dominance  Right    Next MD Visit  04/21/2020    Prior Therapy  HHPT right after his surgery 4-5 months ago      Precautions   Precautions  None      Balance Screen   Has  the patient fallen in the past 6 months  No    Has the patient had a decrease in activity level because of a fear of falling?   Yes   when he is by himself   Is the patient reluctant to leave their home because of a fear of falling?   Yes      [place]  One level    Additional Comments  uses cane at home for short distances when his breathing feels good. Bedroom to kitchen      Prior Function   Level of Independence  Needs assistance with ADLs   aide that helps 5-8 every day   Vocation  On disability    Leisure  sedentary life style    Comments  aide assists with bathing, dressing and household tasks, they have been helping for ~ 11 yrs       Observation/Other Assessments   Focus on Therapeutic Outcomes (FOTO)   not performed      ROM /  Strength   AROM / PROM / Strength  AROM;Strength      AROM   AROM Assessment Site  Hip;Knee;Ankle    Right/Left Hip  --   WNL   Right/Left Knee  --   WNL   Right/Left Ankle  --   Rt DF -10 PROM -4     Strength   Strength Assessment Site  Hip;Knee;Ankle    Right/Left Hip  --   Lt 5-/5, Rt  4/5   Right/Left Knee  --   bilat flex 4-/5, ext 4+/5   Right/Left Ankle  --   Lt 5/5, Rt 3-/5     Ambulation/Gait   Ambulation/Gait  Yes    Ambulation Distance (Feet)  50 Feet   then 175   Assistive device  Straight cane    Gait Pattern  --   Rt foot slap   Ambulation Surface  Level    Gait Comments  O2 sats 91% after first walk, 86% after second longer walk - increased his O2 and recovered after 90 sec to 95% and HR85 and decreased chest tightness.                   Objective measurements completed on examination: See above findings.      [facility] Adult PT Treatment/Exercise - 03/22/20 0001      Exercises   Exercises  Knee/Hip      Knee/Hip Exercises: Stretches   Gastroc Stretch  Both;60 seconds      Knee/Hip Exercises: Seated   Long Arc Quad  Both;10 reps      Knee/Hip Exercises: Supine   Bridges  Both;10 reps             PT Education - 03/22/20 1439    Education Details  HEP and POC    Person(s) Educated  Patient    Methods  Explanation;Demonstration;Handout    Comprehension  Returned demonstration;Verbalized understanding       PT Short Term Goals - 03/22/20 1806      PT SHORT TERM GOAL #1   Title  I with initial HEP    Baseline  not performing any exercise    Time  4    Period  Weeks    Status  New    Target Date  04/19/20  PT SHORT TERM GOAL #2   Title  ambulate 83' with straight cane keeping oxygen sats above 92%    Baseline  50' O2 dropps to 89-90%    Time  4    Period  Weeks    Status  New    Target Date  04/19/20      PT SHORT TERM GOAL #3   Title  improve Rt LE strength =/> 4+/5 to assist with gait    Baseline  varies 3-  to 4/5 strength    Time  4    Period  Weeks    Status  New    Target Date  04/19/20        PT Long Term Goals - 03/22/20 1809      PT LONG TERM GOAL #1   Title  to be established at reassessment             Plan - 03/22/20 1443    Clinical Impression Statement  59 yo male with complicated medical history.  Long h/o respiratory issues, currently using oxygen 24/7.  Pt was a little confused about his past medical h/o in explanation to therapist, Per chart review he  sustained a burst fracture of the lumbar spine L4 and L1 on10/26/2020, was seen in ED and referred to the neurosurgeon that he was already being seen by.  He  had ACDF surgery to fuse C5-6 on 09/06/2019, there is no mention about his lumbar spine. Now he is referred to PT for mylepathy of the Rt LE.  He is to have further assessment on the lower leg on 03/30/2020, pt called it an ultrasound however he was unsure.  He presents in an electric WC, does walk for short distances with a Speculator at home, he is limited mainly d/t respiratory and O2 sats as well as fatigue.  He has weakness in his Rt LE, poor endurance and difficulty with walking.  Would benefit from PT to strength and maximize his functional level.    Personal Factors and Comorbidities  Comorbidity 3+    Comorbidities  ACDF, lumbar compression fx, severe COPD O2 dependent, depressin, arthritis, GERD HTN, Rt hip and shoulder surgeries    Examination-Activity Limitations  Bathing;Bed Mobility;Locomotion Level;Dressing;Stand;Stairs;Hygiene/Grooming    Examination-Participation Restrictions  Community Activity;Meal Prep    Stability/Clinical Decision Making  Evolving/Moderate complexity    Clinical Decision Making  Moderate    Rehab Potential  Fair    PT Frequency  2x / week    PT Duration  4 weeks    PT Treatment/Interventions  Gait training;Taping;Patient/family education;Functional mobility training;Moist Heat;Passive range of motion;Therapeutic  exercise;Cryotherapy;Electrical Stimulation;Neuromuscular re-education;Balance training;Manual techniques    PT Next Visit Plan  LE strengthening activity tolerance activities    Consulted and Agree with Plan of Care  Patient       Patient will benefit from skilled therapeutic intervention in order to improve the following deficits and impairments:  Abnormal gait, Difficulty walking, Decreased strength, Decreased balance, Decreased activity tolerance, Cardiopulmonary status limiting activity  Visit Diagnosis: Muscle weakness (generalized) - Plan: PT plan of care cert/re-cert  Other abnormalities of gait and mobility - Plan: PT plan of care cert/re-cert     Problem List Patient Active Problem List   Diagnosis Date Noted  . HNP (herniated nucleus pulposus), cervical 09/06/2019  . Abdominal pain   . Poor dentition 11/04/2017  . Acute on chronic respiratory failure with hypoxia ([place]) 08/29/2017  . ETOH abuse 03/30/2017  . Cigarette smoker 03/30/2017  .  Acute respiratory failure with hypoxia and hypercapnia ([place]) 03/29/2017  . Special screening for malignant neoplasms, colon   . Benign neoplasm of ascending colon   . Benign neoplasm of descending colon   . Hoarseness 08/22/2016  . GERD (gastroesophageal reflux disease) 08/22/2016  . Atypical chest pain 12/05/2015  . Essential hypertension 06/15/2014  . Pectoralis muscle strain 01/11/2014  . COPD with acute exacerbation ([place]) 01/10/2014  . COPD  GOLD III 01/08/2014  . Smoker 01/08/2014  . Cough 01/08/2014  . Chronic respiratory failure with hypoxia ([place]) 01/08/2014    Jeral Pinch PT 03/22/2020, 6:12 PM  [facility] [address] [place], [place], [address] Phone: [phone]   Fax:  [phone]  Name: MURLIN SCHRIEBER MRN: [id number] Date of Birth: 11/11/1960

## 2020-03-22 NOTE — Patient Instructions (Signed)
    WALKING  Walking is a great form of exercise to increase your strength, endurance and overall fitness.  A walking program can help you start slowly and gradually build endurance as you go.  Everyone's ability is different, so each person's starting point will be different.  You do not have to follow them exactly.  The are just samples. You should simply find out what's right for you and stick to that program.   In the beginning, you'll start off walking 2-3 times a day for short distances.  As you get stronger, you'll be walking further at just 1-2 times per day.  A. You Can Walk For A Certain Length Of Time Each Day    Walk 3 minutes 3 times per day.                                          Pay attention to your O2 sats, if they drop below 90% do not increase/add time.   Increase 1 minutes every 2 days (3 times per day).  Work up to 25-30 minutes (1-2 times per day).   Example:   Day 1-2 80minutes 3 times per day   Day 7-8 6 minutes 2-3 times per day   Day 13-14 9 minutes 1-2 times per day    Please only do the exercises that your therapist has initialed and dated

## 2020-04-03 ENCOUNTER — Telehealth: Payer: Self-pay | Admitting: Internal Medicine

## 2020-04-03 NOTE — Telephone Encounter (Signed)
Called and spoke with Patient. Patient stated [employer] called and and asked if he still needed O2, that Medicaid needed to know. Patient stated he is on O2 most of the time, and was suppose to get a POC last year from [employer], but he has not received it yet. Patient has OV scheduled with Tammy, NP 04/25/20 for follow up. Advised Patient we would call [employer] 04/04/20, when they open to find out about if new qualifying walk [place] needed at [place], and when he will receive POC. POC order was placed 05/13/19.  Message held in triage for 6/8 follow up.

## 2020-04-03 NOTE — Telephone Encounter (Signed)
Patient stated in earlier call about O2 that he has been having some wheezing, chest tightness, and productive cough of mornings.  Patient stated he has a z pack on hold.  No Z pack on med list or in last OV note.  Patient is scheduled follow up with Tammy, NP 04/25/20. Patient stated he is using his O2, taking Mucinex BID, prednisone QOD, Spiriva daily, and  Albuterol as needed. Patient is requesting a Z pack to be sent to [employer].  LOV 03/20/20- Dr [name]    Instructions  Plan A = Automatic = Always=    symbicort and spiriva  Plan B = Backup (to supplement plan A, not to replace it) Only use your albuterol inhaler as a rescue medication to be used if you can't catch your breath by resting or doing a relaxed purse lip breathing pattern.  - The less you use it, the better it will work when you need it. - Ok to use the inhaler up to 2 puffs  every 4 hours if you must but call for appointment if use goes up over your usual need - Don't leave home without it !!  (think of it like the spare tire for your car)   Plan C = Crisis (instead of Plan B but only if Plan B stops working) - only use your albuterol nebulizer if you first try Plan B and it fails to help > ok to use the nebulizer up to every 4 hours but if start needing it regularly call for immediate appointment   Plan D = Deltasone - if you need more albuterol especially the nebulizer to get thu the day then take prednisone 10 mg x 2 each am until better then return to 1 day   Try albuterol 15 min (one day use the inhaler then next the nebulizer) before an activity that you know would normally  make you short of breath on adequate amount of oxygen ( and see if it makes any difference and if makes none then don't take it after activity unless you can't catch your breath.  The key is to stop smoking completely before smoking completely stops you!   Please schedule a follow up office visit in 4 weeks, sooner if needed       Message routed to Dr [name] to advise on Z Pack

## 2020-04-04 ENCOUNTER — Other Ambulatory Visit: Payer: Self-pay

## 2020-04-04 ENCOUNTER — Ambulatory Visit: Payer: Medicaid Other | Attending: Student | Admitting: Physical Therapy

## 2020-04-04 DIAGNOSIS — R2689 Other abnormalities of gait and mobility: Secondary | ICD-10-CM | POA: Insufficient documentation

## 2020-04-04 DIAGNOSIS — M6281 Muscle weakness (generalized): Secondary | ICD-10-CM | POA: Diagnosis not present

## 2020-04-04 MED ORDER — AZITHROMYCIN 250 MG PO TABS: ORAL_TABLET | ORAL | 0 refills | Status: DC

## 2020-04-04 NOTE — Therapy (Signed)
[place], [place], [address] Phone: [phone]   Fax:  [phone]  Physical Therapy Treatment  Patient Details  Name: Tony Sullivan MRN: [id number] Date of Birth: 05/31/1961 Referring Provider (PT): Glenford Peers NP   Encounter Date: 04/04/2020  PT End of Session - 04/04/20 1310    Visit Number  2    Number of Visits  4    Date for PT Re-Evaluation  04/19/20    Authorization Type  MCD    Authorization Time Period  submitted for initial 3V    PT Start Time  1315    PT Stop Time  1401    PT Time Calculation (min)  46 min    Activity Tolerance  Patient limited by fatigue;Other (comment)   SOB and O2 sats   Behavior During Therapy  WFL for tasks assessed/performed       Past Medical History:  Diagnosis Date  . Arthritis   . COPD (chronic obstructive pulmonary disease) ([place]) 2004   diagnosed in 2004, no PFT's to date.  Started on home O2 12/2013, after found to be desatting at PCP's office, and referred to pulmonology.  . Depression   . GERD (gastroesophageal reflux disease)   . High blood pressure   . Prediabetes   . Seasonal allergies   . Tobacco dependence     Past Surgical History:  Procedure Laterality Date  . ANTERIOR CERVICAL DECOMP/DISCECTOMY FUSION N/A 09/06/2019   Procedure: Anterior Cervical Discecectomy Fusion - Cerivcal Five-Cervical Six;  Surgeon: Kary Kos, MD;  Location: [facility];  Service: Neurosurgery;  Laterality: N/A;  Anterior Cervical Discecectomy Fusion - Cerivcal Five-Cervical Six  . ANTERIOR CERVICAL DISCECTOMY  09/06/2019  . APPENDECTOMY  1980  . COLONOSCOPY WITH PROPOFOL N/A 12/10/2016   Procedure: COLONOSCOPY WITH PROPOFOL;  Surgeon: [name], MD;  [facility];  Service: Endoscopy;  Laterality: N/A;  . HIP SURGERY Right   . SHOULDER ARTHROSCOPY Right     There were no vitals filed for this visit.  Subjective Assessment - 04/04/20 2028    Subjective   Pt reports concern about performing his HEP at home due to drops in his O2 and living alone. Pt states he is motivated to improve his strength and LOF. Pt unable to state what was being tested by his MD this past week.    Pertinent History  uses oxygen to help with his breathing 2L, I with transfers    Diagnostic tests  Dr Orpah Melter going to test "something" on the 3rd    Patient Stated Goals  get legs and lungs stronger so he can walk again.    Currently in Pain?  No/denies                        [facility] Adult PT Treatment/Exercise - 04/04/20 0001      Ambulation/Gait   Ambulation/Gait  Yes    Ambulation Distance (Feet)  100 Feet   50'x2 with standing rest break   Assistive device  Straight cane    Gait Pattern  Decreased hip/knee flexion - right;Decreased dorsiflexion - right;Right foot flat;Decreased stance time - right    Ambulation Surface  Level    Gait Comments  Stable spO2; cueing for heel strike       Knee/Hip Exercises: Stretches   Active Hamstring Stretch  Right;60 seconds    Gastroc Stretch  60 seconds;Right      Knee/Hip Exercises: Aerobic  Nustep  L3 2 min x 3   spO2 95%, rest breaks to check O2     Knee/Hip Exercises: Seated   Heel Slides  Right;10 reps    Other Seated Knee/Hip Exercises  Eccentric ankle df x 10 reps    Other Seated Knee/Hip Exercises  ankle DF AROM x 10 reps    Sit to Sand  5 reps;with UE support;2 sets   Rest break due to pt fatigue     Knee/Hip Exercises: Supine   Quad Sets  Right;5 reps    Short Arc Quad Sets  Strengthening;10 reps    Bridges  Strengthening;Both;10 reps             PT Education - 04/04/20 2033    Education Details  Discussed HEP. Discussed pt's concerns about exercising at home and using his pulse ox to monitor his spO2 and HR for safe mobility. Discussed pt's HRmax and optimal HR for endurance activities.    Person(s) Educated  Patient    Methods  Explanation;Demonstration;Handout    Comprehension   Returned demonstration;Verbalized understanding       PT Short Term Goals - 03/22/20 1806      PT SHORT TERM GOAL #1   Title  I with initial HEP    Baseline  not performing any exercise    Time  4    Period  Weeks    Status  New    Target Date  04/19/20      PT SHORT TERM GOAL #2   Title  ambulate 58' with straight cane keeping oxygen sats above 92%    Baseline  50' O2 dropps to 89-90%    Time  4    Period  Weeks    Status  New    Target Date  04/19/20      PT SHORT TERM GOAL #3   Title  improve Rt LE strength =/> 4+/5 to assist with gait    Baseline  varies 3- to 4/5 strength    Time  4    Period  Weeks    Status  New    Target Date  04/19/20        PT Long Term Goals - 03/22/20 1809      PT LONG TERM GOAL #1   Title  to be established at reassessment            Plan - 04/04/20 1311    Clinical Impression Statement  Educated pt about monitoring spO2 and HR during HEP due to pt's concerns about performing HEP at home alone. Treatment focused on strengthening pt's R LE, gait training, improving endurance, and activity tolerance with transfers and amb. Pt with increasing active R ankle DF. SpO2 remained between 92% and 97% during treatment; HR at most 130s after sit<>stands.    Personal Factors and Comorbidities  Comorbidity 3+    Comorbidities  ACDF, lumbar compression fx, severe COPD O2 dependent, depressin, arthritis, GERD HTN, Rt hip and shoulder surgeries    Examination-Activity Limitations  Bathing;Bed Mobility;Locomotion Level;Dressing;Stand;Stairs;Hygiene/Grooming    Examination-Participation Restrictions  Community Activity;Meal Prep    Stability/Clinical Decision Making  Evolving/Moderate complexity    Rehab Potential  Fair    PT Frequency  2x / week    PT Duration  4 weeks    PT Treatment/Interventions  Gait training;Taping;Patient/family education;Functional mobility training;Moist Heat;Passive range of motion;Therapeutic  exercise;Cryotherapy;Electrical Stimulation;Neuromuscular re-education;Balance training;Manual techniques    PT Next Visit Plan  LE strengthening activity tolerance activities  PT Home Exercise Plan  Access Code: [email or web address]    Consulted and Agree with Plan of Care  Patient       Patient will benefit from skilled therapeutic intervention in order to improve the following deficits and impairments:  Abnormal gait, Difficulty walking, Decreased strength, Decreased balance, Decreased activity tolerance, Cardiopulmonary status limiting activity  Visit Diagnosis: Muscle weakness (generalized)  Other abnormalities of gait and mobility     Problem List Patient Active Problem List   Diagnosis Date Noted  . HNP (herniated nucleus pulposus), cervical 09/06/2019  . Abdominal pain   . Poor dentition 11/04/2017  . Acute on chronic respiratory failure with hypoxia ([place]) 08/29/2017  . ETOH abuse 03/30/2017  . Cigarette smoker 03/30/2017  . Acute respiratory failure with hypoxia and hypercapnia ([place]) 03/29/2017  . Special screening for malignant neoplasms, colon   . Benign neoplasm of ascending colon   . Benign neoplasm of descending colon   . Hoarseness 08/22/2016  . GERD (gastroesophageal reflux disease) 08/22/2016  . Atypical chest pain 12/05/2015  . Essential hypertension 06/15/2014  . Pectoralis muscle strain 01/11/2014  . COPD with acute exacerbation ([place]) 01/10/2014  . COPD  GOLD III 01/08/2014  . Smoker 01/08/2014  . Cough 01/08/2014  . Chronic respiratory failure with hypoxia [facility]) 01/08/2014    Kennley Schwandt April Gordy Levan PT, DPT 04/04/2020, 8:51 PM  [facility] [address] [place], [place], [address] Phone: [phone]   Fax:  [phone]  Name: Tony Sullivan MRN: [id number] Date of Birth: 06-15-1961

## 2020-04-04 NOTE — Telephone Encounter (Signed)
Called pt and advised message from the provider. Pt understood and verbalized understanding. Nothing further is needed.   Rx for Z pak sent in.

## 2020-04-04 NOTE — Telephone Encounter (Signed)
zpak is fine  Make sure as AVS says:    prednisone per plan D is 20 mg qd max, should not be on "qod" if having flare

## 2020-04-04 NOTE — Patient Instructions (Signed)
Access Code: [email or web address] URL: [email or web address] Date: 04/04/2020 Prepared by: Estill Bamberg April Thurnell Garbe  Exercises Long Sitting Calf Stretch with Strap - 1 x daily - 7 x weekly - 3 sets - 30 sec hold Supine Hamstring Stretch with Strap - 1 x daily - 7 x weekly - 3 sets - 30 sec hold Supine Bridge - 1 x daily - 7 x weekly - 2 sets - 5 reps Seated Heel Slide - 1 x daily - 7 x weekly - 10 reps - 3 sets Seated Toe Raise - 1 x daily - 7 x weekly - 10 reps - 3 sets

## 2020-04-04 NOTE — Telephone Encounter (Signed)
Contacted [employer], order for POC is still active. POC machines are still not available at this time. Patient will also need a qualifying walk with next visit, added to appointment notes.

## 2020-04-11 ENCOUNTER — Other Ambulatory Visit: Payer: Self-pay

## 2020-04-11 DIAGNOSIS — I10 Essential (primary) hypertension: Secondary | ICD-10-CM

## 2020-04-11 MED ORDER — HYDROCHLOROTHIAZIDE 12.5 MG PO CAPS: 12.5000 mg | ORAL_CAPSULE | Freq: Every morning | ORAL | 1 refills | Status: DC

## 2020-04-12 ENCOUNTER — Other Ambulatory Visit: Payer: Self-pay

## 2020-04-12 ENCOUNTER — Ambulatory Visit: Payer: Medicaid Other | Admitting: Physical Therapy

## 2020-04-12 DIAGNOSIS — M6281 Muscle weakness (generalized): Secondary | ICD-10-CM

## 2020-04-12 DIAGNOSIS — R2689 Other abnormalities of gait and mobility: Secondary | ICD-10-CM

## 2020-04-12 MED ORDER — BUDESONIDE-FORMOTEROL FUMARATE 160-4.5 MCG/ACT IN AERO: INHALATION_SPRAY | RESPIRATORY_TRACT | 3 refills | Status: DC

## 2020-04-12 NOTE — Therapy (Signed)
[place], [place], [address] Phone: [phone]   Fax:  [phone]  Physical Therapy Treatment  Patient Details  Name: Tony Sullivan MRN: [id number] Date of Birth: [birth year] Referring Provider (PT): Glenford Peers NP   Encounter Date: 04/12/2020   PT End of Session - 04/12/20 1320    Visit Number 3    Number of Visits 4    Date for PT Re-Evaluation 04/19/20    Authorization Type MCD    Authorization Time Period submitted for initial 3V    PT Start Time 1320    PT Stop Time 1405    PT Time Calculation (min) 45 min    Activity Tolerance Patient limited by fatigue;Other (comment)   SOB and O2 sats   Behavior During Therapy WFL for tasks assessed/performed           Past Medical History:  Diagnosis Date  . Arthritis   . COPD (chronic obstructive pulmonary disease) ([place]) 2004   diagnosed in 2004, no PFT's to date.  Started on home O2 12/2013, after found to be desatting at PCP's office, and referred to pulmonology.  . Depression   . GERD (gastroesophageal reflux disease)   . High blood pressure   . Prediabetes   . Seasonal allergies   . Tobacco dependence     Past Surgical History:  Procedure Laterality Date  . ANTERIOR CERVICAL DECOMP/DISCECTOMY FUSION N/A 09/06/2019   Procedure: Anterior Cervical Discecectomy Fusion - Cerivcal Five-Cervical Six;  Surgeon: Kary Kos, MD;  Location: [facility];  Service: Neurosurgery;  Laterality: N/A;  Anterior Cervical Discecectomy Fusion - Cerivcal Five-Cervical Six  . ANTERIOR CERVICAL DISCECTOMY  09/06/2019  . APPENDECTOMY  1980  . COLONOSCOPY WITH PROPOFOL N/A 12/10/2016   Procedure: COLONOSCOPY WITH PROPOFOL;  Surgeon: [name], MD;  Location: [facility];  Service: Endoscopy;  Laterality: N/A;  . HIP SURGERY Right   . SHOULDER ARTHROSCOPY Right     There were no vitals filed for this visit.   Subjective Assessment - 04/12/20 1321    Subjective Pt  states it has been slow going. Pt states he has been able to work on his HEP some and has been monitoring his HR and O2.                             [facility] PT Treatment/Exercise - 04/12/20 0001      Ambulation/Gait   Ambulation/Gait Yes    Ambulation Distance (Feet) 50 Feet    Assistive device Straight cane    Gait Pattern Decreased dorsiflexion - right;Right foot flat    Ambulation Surface Level    Gait Comments spO2 89%; recovered to >90% within 1 minute of sitting rest      Knee/Hip Exercises: Stretches   Gastroc Stretch Right;60 seconds   contract relax   Soleus Stretch Right;60 seconds      Knee/Hip Exercises: Aerobic   Nustep L4 3 min x 3   HR 105 after     Knee/Hip Exercises: Standing   Heel Raises Both;10 reps    Hip Abduction Stengthening;Both;5 reps;2 sets   spO2 93%; HR 110s   Other Standing Knee Exercises sit<>stand x 5      Knee/Hip Exercises: Seated   Other Seated Knee/Hip Exercises Eccentric ankle df x 10 reps    Other Seated Knee/Hip Exercises ankle DF AROM x 10 reps  PT Education - 04/12/20 1356    Education Details Discussed pacing for energy conservation. Educated pt on how standing requires more O2 due to using bilat LEs requiring more muscle use.    Person(s) Educated Patient    Methods Explanation;Demonstration;Handout    Comprehension Verbalized understanding;Returned demonstration            PT Short Term Goals - 03/22/20 1806      PT SHORT TERM GOAL #1   Title I with initial HEP    Baseline not performing any exercise    Time 4    Period Weeks    Status New    Target Date 04/19/20      PT SHORT TERM GOAL #2   Title ambulate 83' with straight cane keeping oxygen sats above 92%    Baseline 50' O2 dropps to 89-90%    Time 4    Period Weeks    Status New    Target Date 04/19/20      PT SHORT TERM GOAL #3   Title improve Rt LE strength =/> 4+/5 to assist with gait    Baseline varies 3- to  4/5 strength    Time 4    Period Weeks    Status New    Target Date 04/19/20             PT Long Term Goals - 03/22/20 1809      PT LONG TERM GOAL #1   Title to be established at reassessment                 Plan - 04/12/20 1401    Clinical Impression Statement Treatment focused on strengthening and improving endurance/activity tolerance in standing and seated. SpO2 between 89% to 98% during treatment. SpO2 drops most with amb. HR up to 110s with standing. Pt with improving endurance; however, continues to require rest breaks between sit<>stand t/fs and exercises.    Personal Factors and Comorbidities Comorbidity 3+    Comorbidities ACDF, lumbar compression fx, severe COPD O2 dependent, depressin, arthritis, GERD HTN, Rt hip and shoulder surgeries    Examination-Activity Limitations Bathing;Bed Mobility;Locomotion Level;Dressing;Stand;Stairs;Hygiene/Grooming    Examination-Participation Restrictions Community Activity;Meal Prep    Stability/Clinical Decision Making Evolving/Moderate complexity    Clinical Decision Making Moderate    Rehab Potential Fair    PT Frequency 2x / week    PT Duration 4 weeks    PT Treatment/Interventions Gait training;Taping;Patient/family education;Functional mobility training;Moist Heat;Passive range of motion;Therapeutic exercise;Cryotherapy;Electrical Stimulation;Neuromuscular re-education;Balance training;Manual techniques    PT Next Visit Plan LE strengthening, improving activity tolerance/endurance especially in standing, continue working on R ankle DF    PT Home Exercise Plan Access Code: [email or web address]    Consulted and Agree with Plan of Care Patient           Patient will benefit from skilled therapeutic intervention in order to improve the following deficits and impairments:  Abnormal gait, Difficulty walking, Decreased strength, Decreased balance, Decreased activity tolerance, Cardiopulmonary status limiting activity  Visit  Diagnosis: Muscle weakness (generalized)  Other abnormalities of gait and mobility     Problem List Patient Active Problem List   Diagnosis Date Noted  . HNP (herniated nucleus pulposus), cervical 09/06/2019  . Abdominal pain   . Poor dentition 11/04/2017  . Acute on chronic respiratory failure with hypoxia ([place]) 08/29/2017  . ETOH abuse 03/30/2017  . Cigarette smoker 03/30/2017  . Acute respiratory failure with hypoxia and hypercapnia ([place]) 03/29/2017  . Special screening for malignant neoplasms,  colon   . Benign neoplasm of ascending colon   . Benign neoplasm of descending colon   . Hoarseness 08/22/2016  . GERD (gastroesophageal reflux disease) 08/22/2016  . Atypical chest pain 12/05/2015  . Essential hypertension 06/15/2014  . Pectoralis muscle strain 01/11/2014  . COPD with acute exacerbation ([place]) 01/10/2014  . COPD  GOLD III 01/08/2014  . Smoker 01/08/2014  . Cough 01/08/2014  . Chronic respiratory failure with hypoxia [facility]) 01/08/2014    Daejon Lich [address] [place] PT, DPT 04/12/2020, 3:09 PM  [facility] [address] [place], [place], [address] Phone: [phone]   Fax:  [phone]  Name: Tony Sullivan MRN: [id number] Date of Birth: 29-Jul-1961

## 2020-04-14 ENCOUNTER — Telehealth: Payer: Self-pay | Admitting: Internal Medicine

## 2020-04-14 NOTE — Telephone Encounter (Signed)
Called [employer] and spoke with requalification team specialist Tony Sullivan letting her know that our office has had to reschedule pt's appt due to scheduling conflict but stated to her that he is scheduled for the qualifying walk and OV 7/7. Tony Sullivan verbalized understanding.  Called and spoke with pt letting him know that [employer] was notified of info and that all has been taken care of. Pt verbalized understanding. Nothing further needed.

## 2020-04-18 ENCOUNTER — Ambulatory Visit: Payer: Medicaid Other | Admitting: Adult Health

## 2020-04-19 ENCOUNTER — Ambulatory Visit: Payer: Medicaid Other | Admitting: Physical Therapy

## 2020-04-19 ENCOUNTER — Telehealth: Payer: Self-pay | Admitting: Gastroenterology

## 2020-04-19 NOTE — Telephone Encounter (Signed)
Patient paged on-call re: exacerbation of his chronic constipation. Last BM was 3 days ago.Still taking Miralax and had taken Ex-Lax, without BM. Consuming >64 oz/water per day. Pain on left side of abdomen, similar to prior episodes of constipation. No n/v, but reduced appetite.   Recommended trial of Mag Citrate and/or Fleets enema, but declined due to cost. Pain not currently present and toleraing meds/fluids. Did discuss ER precautions, but otherwise will plan for continued laxative this evening and if no improvement, will have call back for patient tomorrow to discuss additional eval (ie, exam, possible X-ray, etc), and medications as appropriate. All questions answered and appreciative of call back.

## 2020-04-20 ENCOUNTER — Telehealth: Payer: Self-pay | Admitting: Internal Medicine

## 2020-04-20 NOTE — Telephone Encounter (Signed)
Adjust 02 to keep it over 90%  Adjust prednisone as we talked about and per avs   20 mg daily until better then 1 daily   The constipation may be affecting his breathing > PCP should be managing this

## 2020-04-20 NOTE — Telephone Encounter (Signed)
Called pt and advised message from the provider. Pt understood and verbalized understanding. Nothing further is needed.    

## 2020-04-20 NOTE — Telephone Encounter (Signed)
Patient called, reporting severe constipation, clear mucus that is hard to clear, he remains on oygen 3.5 liters with sats 88-91%. He states his SOB is mild. He also got his second Covid vaccine on Sunday. No wheezing, no cough, no other symptoms reported. He is on a few meds for his constipation. Please advise.

## 2020-04-20 NOTE — Telephone Encounter (Signed)
Please route message to Dr. [name] as this is patient.  Wyn Quaker, FNP

## 2020-04-20 NOTE — Telephone Encounter (Signed)
Dr. Wert, please advise. 

## 2020-04-21 ENCOUNTER — Ambulatory Visit: Payer: Medicaid Other | Admitting: Cardiology

## 2020-04-21 ENCOUNTER — Encounter: Payer: Self-pay | Admitting: Cardiology

## 2020-04-21 ENCOUNTER — Other Ambulatory Visit: Payer: Self-pay

## 2020-04-21 VITALS — BP 116/85 | HR 84 | Resp 16 | Ht 71.0 in | Wt 152.0 lb

## 2020-04-21 DIAGNOSIS — I10 Essential (primary) hypertension: Secondary | ICD-10-CM

## 2020-04-21 DIAGNOSIS — Z712 Person consulting for explanation of examination or test findings: Secondary | ICD-10-CM

## 2020-04-21 DIAGNOSIS — F17200 Nicotine dependence, unspecified, uncomplicated: Secondary | ICD-10-CM

## 2020-04-21 DIAGNOSIS — M7989 Other specified soft tissue disorders: Secondary | ICD-10-CM

## 2020-04-21 NOTE — Progress Notes (Signed)
Date:  04/21/2020   ID:  Tony Sullivan, DOB 07-Jul-1961, MRN [id number]  PCP:  [name], FNP  Cardiologist:  Rex Kras, DO, [facility] (established care 03/10/2020)  Date: 04/21/20 Last Office Visit: 03/10/2020 Chief Complaint  Patient presents with  . Leg Swelling    Re-evaluation of symptoms  . Results  . Follow-up    HPI  Tony Sullivan is a 59 y.o. male who presents to the office with a  chief complaint of " reevaluate lower extremity swelling and improve your test results."  His past medical history and cardiovascular risk factors are: COPD on home O2, prediabetes, active smoking, hypertension, abnormal ABIs suggestive of peripheral vascular disease.  Patient was originally referred to the office for evaluation of lower extremity swelling.   At the last office visit, patient was recommended to stop the amlodipine as that might be contributing to lower extremity swelling and he was transitioned to hydrochlorothiazide as an alternative antihypertensive medication.  Patient had follow-up blood work in 1 week to reevaluate kidney function and electrolytes which was within normal limits.  He was also recommended to undergo an echocardiogram to evaluate for structural heart disease.  Patient is noted to have preserved LVEF with normal diastolic function.  Since last office visit his symptoms of lower extremity swelling have resolved.  He feels great overall.  No active cardiac symptoms of chest pain at rest or with effort related activities.  And given his oxygen dependent COPD his shortness of breath is chronic and stable.  Given his underlying COPD patient uses oxygen with activity anywhere from 3 to 4 L.  No prior history of congestive heart failure or established coronary artery disease.  Clinically patient is euvolemic and not in congestive heart failure.  Denies prior history of coronary artery disease, myocardial infarction, congestive heart failure, deep venous thrombosis,  pulmonary embolism, stroke, transient ischemic attack.  FUNCTIONAL STATUS: No structured exercise program or daily routine. Presents to the office in motorized wheelchair due to back pain.   ALLERGIES: Allergies  Allergen Reactions  . Penicillins Anaphylaxis    Has patient had a PCN reaction causing immediate rash, facial/tongue/throat swelling, SOB or lightheadedness with hypotension: Yes Has patient had a PCN reaction causing severe rash involving mucus membranes or skin necrosis: No Has patient had a PCN reaction that required hospitalization No Has patient had a PCN reaction occurring within the last 10 years: No If all of the above answers are "NO", then may proceed with Cephalosporin use.   . Sulfa Antibiotics Swelling    Swelling of hands and throat   . Famotidine Other (See Comments)    Bloating of stomach, loss of appetite  . Levocetirizine Other (See Comments)    Muscle cramps    MEDICATION LIST PRIOR TO VISIT: Current Meds  Medication Sig  . albuterol (PROAIR HFA) 108 (90 Base) MCG/ACT inhaler Inhale 2 puffs into the lungs every 4 (four) hours as needed. INHALE TWO PUFFS UP TO EVERY 4 HOURS IF NEEDED FOR SHORT OF BREATH  . albuterol (PROVENTIL) (2.5 MG/3ML) 0.083% nebulizer solution Take 3 mLs (2.5 mg total) by nebulization every 4 (four) hours as needed for wheezing or shortness of breath.  . benztropine (COGENTIN) 0.5 MG tablet Take 0.5 mg by mouth at bedtime.   . budesonide-formoterol (SYMBICORT) 160-4.5 MCG/ACT inhaler INHALE TWO PUFFS BY MOUTH INTO THE LUNGS TWICE A DAY  . cetirizine (ZYRTEC) 10 MG tablet Take 10 mg by mouth daily.  . famotidine (PEPCID)  20 MG tablet TAKE ONE TABLET BY MOUTH DAILY AT BEDTIME  . fluticasone (FLONASE) 50 MCG/ACT nasal spray Place 2 sprays into both nostrils 3 (three) times daily as needed for allergies.   . hydrochlorothiazide (MICROZIDE) 12.5 MG capsule Take 1 capsule (12.5 mg total) by mouth in the morning.  . latanoprost (XALATAN)  0.005 % ophthalmic solution Place 1 drop into both eyes at bedtime.  Marland Kitchen losartan (COZAAR) 100 MG tablet Take 100 mg by mouth daily.  Marland Kitchen loxapine (LOXITANE) 10 MG capsule Take 10 mg by mouth at bedtime.   . mirtazapine (REMERON) 30 MG tablet Take 30 mg by mouth at bedtime.  . Multiple Vitamin (MULTIVITAMIN) capsule Take 1 capsule by mouth daily.  . OXYGEN Inhale 2 L into the lungs as directed. 2-3 liters with sleep and exertion as needed for low oxygen  . pantoprazole (PROTONIX) 40 MG tablet Take 1 tablet (40 mg total) by mouth daily. Take 30-60 min before first meal of the day  . polyethylene glycol (MIRALAX / GLYCOLAX) 17 g packet TAKE 17 GRAMS BY MOUTH DAILY  . predniSONE (DELTASONE) 10 MG tablet Take 1 tablet (10 mg total) by mouth daily with breakfast.  . Respiratory Therapy Supplies (FLUTTER) DEVI Use as directed  . risperiDONE (RISPERDAL) 2 MG tablet Take 2 mg by mouth at bedtime.  Marland Kitchen SPIRIVA RESPIMAT 2.5 MCG/ACT AERS INHALE TWO PUFFS BY MOUTH EVERY MORNING     PAST MEDICAL HISTORY: Past Medical History:  Diagnosis Date  . Arthritis   . COPD (chronic obstructive pulmonary disease) ([place]) 2004   diagnosed in 2004, no PFT's to date.  Started on home O2 12/2013, after found to be desatting at PCP's office, and referred to pulmonology.  . Depression   . GERD (gastroesophageal reflux disease)   . High blood pressure   . Prediabetes   . Seasonal allergies   . Tobacco dependence     PAST SURGICAL HISTORY: Past Surgical History:  Procedure Laterality Date  . ANTERIOR CERVICAL DECOMP/DISCECTOMY FUSION N/A 09/06/2019   Procedure: Anterior Cervical Discecectomy Fusion - Cerivcal Five-Cervical Six;  Surgeon: Kary Kos, MD;  Location: [place];  Service: Neurosurgery;  Laterality: N/A;  Anterior Cervical Discecectomy Fusion - Cerivcal Five-Cervical Six  . ANTERIOR CERVICAL DISCECTOMY  09/06/2019  . APPENDECTOMY  1980  . COLONOSCOPY WITH PROPOFOL N/A 12/10/2016   Procedure: COLONOSCOPY WITH  PROPOFOL;  Surgeon: [name], MD;  Location: [facility];  Service: Endoscopy;  Laterality: N/A;  . HIP SURGERY Right   . SHOULDER ARTHROSCOPY Right     FAMILY HISTORY: The patient family history includes Allergies in his father, mother, and son; Asthma in his mother; Cancer in his mother; Clotting disorder in his mother; Diabetes in his brother; Emphysema in his father and mother; Heart disease in his mother; Seizures in his brother.  SOCIAL HISTORY:  The patient  reports that he has been smoking cigarettes. He has been smoking about 0.00 packs per day for the past 36.00 years. He has never used smokeless tobacco. He reports previous alcohol use of about 14.0 - 21.0 standard drinks of alcohol per week. He reports previous drug use. Drugs: Marijuana and Cocaine.  REVIEW OF SYSTEMS: Review of Systems  Constitutional: Negative for chills and fever.  HENT: Negative for hoarse voice and nosebleeds.   Eyes: Negative for discharge, double vision and pain.  Cardiovascular: Positive for leg swelling (localized to ankle.). Negative for chest pain, claudication, dyspnea on exertion, near-syncope, orthopnea, palpitations, paroxysmal nocturnal dyspnea and  syncope.  Respiratory: Negative for hemoptysis and shortness of breath.   Musculoskeletal: Negative for muscle cramps and myalgias.  Gastrointestinal: Negative for abdominal pain, constipation, diarrhea, hematemesis, hematochezia, melena, nausea and vomiting.  Neurological: Negative for dizziness and light-headedness.    PHYSICAL EXAM: Vitals with BMI 04/21/2020 03/20/2020 03/10/2020  Height _0  _1  _2   Weight 152 lbs 162 lbs 3 oz 154 lbs  BMI 21.21 73.22 02.54  Systolic 270 623 762  Diastolic 85 80 79  Pulse 84 98 93    CONSTITUTIONAL: Well-developed and well-nourished. No acute distress.  SKIN: Skin is warm and dry. No rash noted. No cyanosis. No pallor. No jaundice HEAD: Normocephalic and atraumatic.  EYES: No scleral  icterus MOUTH/THROAT: Moist oral membranes. On nasal canula oxygen.  NECK: No JVD present. No thyromegaly noted. No carotid bruits  LYMPHATIC: No visible cervical adenopathy.  CHEST Normal respiratory effort. No intercostal retractions  LUNGS: Decreased breath sounds at the bases.  No stridor. No wheezes. No rales.  CARDIOVASCULAR: Regular, positive G3-T5, soft systolic murmur heard over the left sternal border, no gallops or rubs. ABDOMINAL: No apparent ascites.  EXTREMITIES: No peripheral edema.  Faint bilateral posterior tibial pulses and 2+ bilateral dorsalis pedis pulses. HEMATOLOGIC: No significant bruising NEUROLOGIC: Oriented to person, place, and time. Nonfocal. Normal muscle tone.  PSYCHIATRIC: Normal mood and affect. Normal behavior. Cooperative  CARDIAC DATABASE: EKG: 03/10/2020: Normal sinus rhythm, 90 bpm, right atrial enlargement, anteroseptal infarct, without underlying injury pattern.  Echocardiogram: 03/16/2020: LVEF 60-65%. Mild concentric LVH. Normal diastolic filling pattern, normal LAP. Trace MR, Mild MAC, Mild TR. No significant change from prior study dated 04/16/2017.   Stress Testing: None  Heart Catheterization: None  Vascular imaging: Lower Extremity 07/02/2019: Right: Resting right ankle-brachial index is within normal range. No evidence of significant right lower extremity arterial disease. Left: Resting left ankle-brachial index indicates moderate left lower extremity arterial disease.   LABORATORY DATA: CBC Latest Ref Rng & Units 02/06/2020 08/28/2019 08/28/2019  WBC 4.0 - 10.5 K/uL 10.5 15.0(H) 18.8(H)  Hemoglobin 13.0 - 17.0 g/dL 14.5 14.0 13.9  Hematocrit 39 - 52 % 47.2 43.3 42.9  Platelets 150 - 400 K/uL 357 339 346    CMP Latest Ref Rng & Units 03/17/2020 02/06/2020 08/28/2019  Glucose 65 - 99 mg/dL 132(H) 138(H) 96  BUN 6 - 24 mg/dL _3 Creatinine 0.76 - 1.27 mg/dL 0.94 1.13 0.88  Sodium 134 - 144 mmol/L 144 140 140  Potassium 3.5 - 5.2  mmol/L 4.0 4.1 4.1  Chloride 96 - 106 mmol/L 103 107 101  CO2 20 - 29 mmol/L _4 Calcium 8.7 - 10.2 mg/dL 9.5 8.8(L) 9.5  Total Protein 6.5 - 8.1 g/dL - - -  Total Bilirubin 0.3 - 1.2 mg/dL - - -  Alkaline Phos 38 - 126 U/L - - -  AST 15 - 41 U/L - - -  ALT 0 - 44 U/L - - -    Lipid Panel  No results found for: CHOL, TRIG, HDL, CHOLHDL, VLDL, LDLCALC, LDLDIRECT, LABVLDL  No results found for: HGBA1C No components found for: NTPROBNP No results found for: TSH  BMP Recent Labs    08/28/19 0509 02/06/20 2001 03/17/20 1349  NA 140 140 144  K 4.1 4.1 4.0  CL 101 107 103  CO2 _5 GLUCOSE 96 138* 132*  BUN _6 CREATININE 0.88 1.13 0.94  CALCIUM 9.5 8.8* 9.5  GFRNONAA >60 >  60 90  GFRAA >60 >60 104    CBC No results for input(s): WBC, RBC, HGB, HCT, PLT, MCV, MCH, MCHC, RDW, LYMPHSABS, MONOABS, EOSABS, BASOSABS in the last 168 hours.  Invalid input(s): NEUTRABS  HEMOGLOBIN A1C No results found for: HGBA1C, MPG  Cardiac Panel (last 3 results) No results for input(s): CKTOTAL, CKMB, TROPONINI, RELINDX in the last 8760 hours. No results for input(s): TROPIPOC in the last 8760 hours.  BNP    Component Value Date/Time   BNP 20.4 02/06/2020 2001   ProBNP    Component Value Date/Time   PROBNP 29.2 07/09/2014 0955   TSH No results for input(s): TSH in the last 8760 hours.  CHOLESTEROL No results for input(s): CHOL in the last 8760 hours.  Hepatic Function Panel Recent Labs    07/02/19 1132 08/28/19 0001  PROT 6.7 6.5  ALBUMIN 3.4* 3.4*  AST 24 20  ALT 27 21  ALKPHOS 111 149*  BILITOT 0.4 1.0    IMPRESSION:  No diagnosis found.   RECOMMENDATIONS: Tony Sullivan is a 59 y.o. male whose past medical history and cardiac risk factors include: COPD on home O2, prediabetes, active smoking, hypertension, abnormal ABIs suggestive of peripheral vascular disease.  Lower extremity swelling: Resolved  Tolerated transition from amlodipine to  hydrochlorothiazide well without any side effects or intolerances.  Benign essential hypertension: Currently managed by primary team.  Blood pressure currently at goal.  Patient was recommended to proceed with stress testing given his cardiovascular risk factors.  However, would patient would like to hold off on this at the current time as he is asymptomatic.  Which is acceptable we will readdress this at the next office visit.   Cigarette smoker: Educated on importance of complete smoking cessation.  Patient is scheduled for a follow-up with his primary care provider recommended that he have a fasting lipid profile.  FINAL MEDICATION LIST END OF ENCOUNTER: No orders of the defined types were placed in this encounter.   Medications Discontinued During This Encounter  Medication Reason  . azithromycin (ZITHROMAX) 250 MG tablet Completed Course     Current Outpatient Medications:  .  albuterol (PROAIR HFA) 108 (90 Base) MCG/ACT inhaler, Inhale 2 puffs into the lungs every 4 (four) hours as needed. INHALE TWO PUFFS UP TO EVERY 4 HOURS IF NEEDED FOR SHORT OF BREATH, Disp: 18 g, Rfl: 3 .  albuterol (PROVENTIL) (2.5 MG/3ML) 0.083% nebulizer solution, Take 3 mLs (2.5 mg total) by nebulization every 4 (four) hours as needed for wheezing or shortness of breath., Disp: 75 mL, Rfl: 3 .  benztropine (COGENTIN) 0.5 MG tablet, Take 0.5 mg by mouth at bedtime. , Disp: , Rfl:  .  budesonide-formoterol (SYMBICORT) 160-4.5 MCG/ACT inhaler, INHALE TWO PUFFS BY MOUTH INTO THE LUNGS TWICE A DAY, Disp: 1 Inhaler, Rfl: 3 .  cetirizine (ZYRTEC) 10 MG tablet, Take 10 mg by mouth daily., Disp: , Rfl:  .  famotidine (PEPCID) 20 MG tablet, TAKE ONE TABLET BY MOUTH DAILY AT BEDTIME, Disp: 30 tablet, Rfl: 2 .  fluticasone (FLONASE) 50 MCG/ACT nasal spray, Place 2 sprays into both nostrils 3 (three) times daily as needed for allergies. , Disp: , Rfl:  .  hydrochlorothiazide (MICROZIDE) 12.5 MG capsule, Take 1 capsule  (12.5 mg total) by mouth in the morning., Disp: 90 capsule, Rfl: 1 .  latanoprost (XALATAN) 0.005 % ophthalmic solution, Place 1 drop into both eyes at bedtime., Disp: , Rfl:  .  losartan (COZAAR) 100 MG tablet, Take 100  mg by mouth daily., Disp: , Rfl:  .  loxapine (LOXITANE) 10 MG capsule, Take 10 mg by mouth at bedtime. , Disp: , Rfl:  .  mirtazapine (REMERON) 30 MG tablet, Take 30 mg by mouth at bedtime., Disp: , Rfl:  .  Multiple Vitamin (MULTIVITAMIN) capsule, Take 1 capsule by mouth daily., Disp: , Rfl:  .  OXYGEN, Inhale 2 L into the lungs as directed. 2-3 liters with sleep and exertion as needed for low oxygen, Disp: , Rfl:  .  pantoprazole (PROTONIX) 40 MG tablet, Take 1 tablet (40 mg total) by mouth daily. Take 30-60 min before first meal of the day, Disp: 30 tablet, Rfl: 2 .  polyethylene glycol (MIRALAX / GLYCOLAX) 17 g packet, TAKE 17 GRAMS BY MOUTH DAILY, Disp: 14 each, Rfl: 1 .  predniSONE (DELTASONE) 10 MG tablet, Take 1 tablet (10 mg total) by mouth daily with breakfast., Disp: 100 tablet, Rfl: 0 .  Respiratory Therapy Supplies (FLUTTER) DEVI, Use as directed, Disp: 1 each, Rfl: 0 .  risperiDONE (RISPERDAL) 2 MG tablet, Take 2 mg by mouth at bedtime., Disp: , Rfl:  .  SPIRIVA RESPIMAT 2.5 MCG/ACT AERS, INHALE TWO PUFFS BY MOUTH EVERY MORNING, Disp: 4 g, Rfl: 6  No orders of the defined types were placed in this encounter.  --Continue cardiac medications as reconciled in final medication list. --Return in about 6 months (around 10/21/2020) for re-evaluation of symptoms.. Or sooner if needed. --Continue follow-up with your primary care physician regarding the management of your other chronic comorbid conditions.  Patient's questions and concerns were addressed to his satisfaction. He voices understanding of the instructions provided during this encounter.   This note was created using a voice recognition software as a result there may be grammatical errors inadvertently enclosed  that do not reflect the nature of this encounter. Every attempt is made to correct such errors.  Rex Kras, [place], [facility]  Pager: [phone] Office: [phone]

## 2020-04-24 ENCOUNTER — Ambulatory Visit: Payer: Medicaid Other | Admitting: Physical Therapy

## 2020-04-24 ENCOUNTER — Other Ambulatory Visit: Payer: Self-pay

## 2020-04-24 DIAGNOSIS — M6281 Muscle weakness (generalized): Secondary | ICD-10-CM | POA: Diagnosis not present

## 2020-04-24 DIAGNOSIS — R2689 Other abnormalities of gait and mobility: Secondary | ICD-10-CM

## 2020-04-24 NOTE — Therapy (Signed)
[place], [place], [address] Phone: [phone]   Fax:  [phone]  Physical Therapy Treatment  Patient Details  Name: Tony Sullivan MRN: [id number] Date of Birth: 24-Sep-1961 Referring Provider (PT): Glenford Peers NP   Encounter Date: 04/24/2020   PT End of Session - 04/24/20 1553    Visit Number 5    Number of Visits 16    Date for PT Re-Evaluation 04/19/20    Authorization Type MCD - re-auth submitted    Authorization Time Period Submitted for 12 visits    Authorization - Visit Number 1    Authorization - Number of Visits 12    PT Start Time 4008    PT Stop Time 6761    PT Time Calculation (min) 48 min    Activity Tolerance Patient limited by fatigue;Other (comment)   SOB and O2 sats   Behavior During Therapy WFL for tasks assessed/performed           Past Medical History:  Diagnosis Date  . Arthritis   . COPD (chronic obstructive pulmonary disease) ([place]) 2004   diagnosed in 2004, no PFT's to date.  Started on home O2 12/2013, after found to be desatting at PCP's office, and referred to pulmonology.  . Depression   . GERD (gastroesophageal reflux disease)   . High blood pressure   . Prediabetes   . Seasonal allergies   . Tobacco dependence     Past Surgical History:  Procedure Laterality Date  . ANTERIOR CERVICAL DECOMP/DISCECTOMY FUSION N/A 09/06/2019   Procedure: Anterior Cervical Discecectomy Fusion - Cerivcal Five-Cervical Six;  Surgeon: Kary Kos, MD;  Location: [facility];  Service: Neurosurgery;  Laterality: N/A;  Anterior Cervical Discecectomy Fusion - Cerivcal Five-Cervical Six  . ANTERIOR CERVICAL DISCECTOMY  09/06/2019  . APPENDECTOMY  1980  . COLONOSCOPY WITH PROPOFOL N/A 12/10/2016   Procedure: COLONOSCOPY WITH PROPOFOL;  Surgeon: [name], MD;  [facility];  Service: Endoscopy;  Laterality: N/A;  . HIP SURGERY Right   . SHOULDER ARTHROSCOPY Right     There  were no vitals filed for this visit.   Subjective Assessment - 04/24/20 1558    Subjective Pt reports he got the COVID vaccine and saw his doctor today. Pt reports they did more testing on his nerve but results are in. Pt comes into clinic having forgotten his O2 tank.    Patient Stated Goals get legs and lungs stronger so he can walk again.    Currently in Pain? No/denies              [facility] PT Assessment - 04/24/20 0001      Strength   Right/Left Hip --   4+/5   Right/Left Knee --   4+/5   Right/Left Ankle --   Lt: 5/5, Rt: 3/5 into DF, 4/5 into PF     Ambulation/Gait   Ambulation/Gait Yes    Ambulation Distance (Feet) 75 Feet    Assistive device Straight cane    Gait Pattern --   Slightly flexed trunk   Ambulation Surface Level    Gait Comments spO2 82%; recovered to 90% within 1 minute on room air                         [facility] Adult PT Treatment/Exercise - 04/24/20 0001      Knee/Hip Exercises: Standing   Lateral Step Up Both;2 sets;5 reps    Forward  Step Up 5 reps;2 sets    Rocker Board --   10 reps x 2 sets     Knee/Hip Exercises: Seated   Other Seated Knee/Hip Exercises Eccentric ankle df x 10 reps with 3 sec hold    Sit to Sand 5 reps;2 sets;without UE support                    PT Short Term Goals - 04/24/20 1612      PT SHORT TERM GOAL #1   Title I with initial HEP    Baseline not performing any exercise    Time 4    Period Weeks    Status Achieved    Target Date 04/19/20      PT SHORT TERM GOAL #2   Title ambulate 50' with straight cane keeping oxygen sats above 92%    Baseline 75' O2 drops 80% on no O2    Time 4    Period Weeks    Status On-going    Target Date 04/19/20      PT SHORT TERM GOAL #3   Title improve Rt LE strength =/> 4+/5 to assist with gait    Baseline R ankle DF 3+/5, PF 4/5. Otherwise, R LE 4+/5    Time 4    Period Weeks    Status On-going    Target Date 04/19/20             PT Long Term Goals  - 04/24/20 1613      PT LONG TERM GOAL #1   Title Pt will be able to tolerate standing activities >/= 10 minutes with stable spO2 >90% on O2    Baseline Unable to tolerate >/=5 min    Time 6    Period Weeks    Status New    Target Date 06/05/20      PT LONG TERM GOAL #2   Title Pt will be able to ascend/descend 5 steps without railing and stable spO2 >90% on O2    Baseline Unable    Time 6    Period Weeks    Status New    Target Date 06/05/20      PT LONG TERM GOAL #3   Title Pt will be able to perfrom 5x sit<>stand <13 sec for reduced fall risk with spO2 >/= 90% on O2    Baseline 15 sec; spO2 83%    Time 6    Period Weeks    Status New    Target Date 06/05/20      PT LONG TERM GOAL #4   Title Pt will be independent with advanced HEP    Baseline Limited    Time 6    Period Weeks    Status New    Target Date 06/05/20      PT LONG TERM GOAL #5   Title Pt has goals to amb 20 minutes with rest breaks as needed    Baseline Unable    Time 6    Period Weeks    Status New    Target Date 06/05/20                 Plan - 04/24/20 1627    Clinical Impression Statement Pt presents to clinic having forgotten his O2 tank. SpO2 77% to 90% with activity. Pt with improving strength and meeting short term goals. Goals adjusted accordingly for MCD re-auth. Treatment focused on improving endurance and activity tolerance in standing within pt's tolerance.  Personal Factors and Comorbidities Comorbidity 3+    Comorbidities ACDF, lumbar compression fx, severe COPD O2 dependent, depressin, arthritis, GERD HTN, Rt hip and shoulder surgeries    Examination-Activity Limitations Bathing;Bed Mobility;Locomotion Level;Dressing;Stand;Stairs;Hygiene/Grooming    Examination-Participation Restrictions Community Activity;Meal Prep    Stability/Clinical Decision Making Evolving/Moderate complexity    Rehab Potential Fair    PT Frequency 2x / week    PT Duration 4 weeks    PT  Treatment/Interventions Gait training;Taping;Patient/family education;Functional mobility training;Moist Heat;Passive range of motion;Therapeutic exercise;Cryotherapy;Electrical Stimulation;Neuromuscular re-education;Balance training;Manual techniques    PT Next Visit Plan LE strengthening, improving activity tolerance/endurance especially in standing, continue working on R ankle DF    PT Home Exercise Plan Access Code: [email or web address]    Consulted and Agree with Plan of Care Patient           Patient will benefit from skilled therapeutic intervention in order to improve the following deficits and impairments:  Abnormal gait, Difficulty walking, Decreased strength, Decreased balance, Decreased activity tolerance, Cardiopulmonary status limiting activity  Visit Diagnosis: Muscle weakness (generalized)  Other abnormalities of gait and mobility     Problem List Patient Active Problem List   Diagnosis Date Noted  . HNP (herniated nucleus pulposus), cervical 09/06/2019  . Abdominal pain   . Poor dentition 11/04/2017  . Acute on chronic respiratory failure with hypoxia ([place]) 08/29/2017  . ETOH abuse 03/30/2017  . Cigarette smoker 03/30/2017  . Acute respiratory failure with hypoxia and hypercapnia ([place]) 03/29/2017  . Special screening for malignant neoplasms, colon   . Benign neoplasm of ascending colon   . Benign neoplasm of descending colon   . Hoarseness 08/22/2016  . GERD (gastroesophageal reflux disease) 08/22/2016  . Atypical chest pain 12/05/2015  . Essential hypertension 06/15/2014  . Pectoralis muscle strain 01/11/2014  . COPD with acute exacerbation ([place]) 01/10/2014  . COPD  GOLD III 01/08/2014  . Smoker 01/08/2014  . Cough 01/08/2014  . Chronic respiratory failure with hypoxia [facility]) 01/08/2014    Jenisis Harmsen [address] PT, DPT 04/24/2020, 4:52 PM  [facility] [address] [place], [place], [address] Phone: [phone]    Fax:  [phone]  Name: Tony Sullivan MRN: [id number] Date of Birth: 01-Mar-1961

## 2020-04-25 ENCOUNTER — Ambulatory Visit: Payer: Medicaid Other | Admitting: Adult Health

## 2020-05-03 ENCOUNTER — Other Ambulatory Visit: Payer: Self-pay

## 2020-05-03 ENCOUNTER — Encounter: Payer: Self-pay | Admitting: Primary Care

## 2020-05-03 ENCOUNTER — Ambulatory Visit (INDEPENDENT_AMBULATORY_CARE_PROVIDER_SITE_OTHER): Payer: Medicaid Other | Admitting: Primary Care

## 2020-05-03 VITALS — BP 118/78 | HR 88 | Temp 98.4°F | Ht 71.0 in | Wt 154.4 lb

## 2020-05-03 DIAGNOSIS — J9602 Acute respiratory failure with hypercapnia: Secondary | ICD-10-CM

## 2020-05-03 DIAGNOSIS — J449 Chronic obstructive pulmonary disease, unspecified: Secondary | ICD-10-CM

## 2020-05-03 DIAGNOSIS — J9601 Acute respiratory failure with hypoxia: Secondary | ICD-10-CM | POA: Diagnosis not present

## 2020-05-03 DIAGNOSIS — J9611 Chronic respiratory failure with hypoxia: Secondary | ICD-10-CM

## 2020-05-03 DIAGNOSIS — J441 Chronic obstructive pulmonary disease with (acute) exacerbation: Secondary | ICD-10-CM

## 2020-05-03 DIAGNOSIS — J9621 Acute and chronic respiratory failure with hypoxia: Secondary | ICD-10-CM | POA: Diagnosis not present

## 2020-05-03 MED ORDER — PREDNISONE 10 MG PO TABS: 10.0000 mg | ORAL_TABLET | Freq: Every day | ORAL | 0 refills | Status: DC

## 2020-05-03 MED ORDER — ALBUTEROL SULFATE HFA 108 (90 BASE) MCG/ACT IN AERS: 2.0000 | INHALATION_SPRAY | RESPIRATORY_TRACT | 3 refills | Status: DC | PRN

## 2020-05-03 NOTE — Progress Notes (Signed)
@Patient  ID: Tony Sullivan, male    DOB: 03/31/1961, 59 y.o.   MRN: [id number]  Chief Complaint  Patient presents with  . Follow-up    pt needs refills and to requalify for oxygen.    Referring provider: [name], FNP  HPI: 59 year old male, former smoker quit in June 2021. PMH significant for COPD GOLD III, chronic respiratory failure. Patient of Dr. [name], last seen 03/20/20.   05/03/2020 Patient presents today for 1 month follow-up/re-qualify for oxygen with [facility]. He is doing considerably better since he started pulmonary rehab and quit smoking last month. He continues to use 3L pulsed oxygen and reports O2 stays between 90-92%; he will desaturate to 88-89% on room air. He has been on oxygen as needed for 3 years. He is attending pulmonary rehab twice a week  He is not having any new respiratory symptoms today. He has an early morning cough, he gets up a lot of mucus then but its clear. He is not struggling as much as he was with his breathing since going to pulmonary rehab. He QUIT SMOKING since his last visit. He did this without medication on his own.   He continues compliance with Symbicort + Spiriva. He is on 10mg  prednisone daily (ceiling 20mg  daily). He is not having to take prednisone every day, unless he is doing rehab or going very hard. He has been using his breathing treatments more d/t heat/humidity.   Allergies  Allergen Reactions  . Penicillins Anaphylaxis    Has patient had a PCN reaction causing immediate rash, facial/tongue/throat swelling, SOB or lightheadedness with hypotension: Yes Has patient had a PCN reaction causing severe rash involving mucus membranes or skin necrosis: No Has patient had a PCN reaction that required hospitalization No Has patient had a PCN reaction occurring within the last 10 years: No If all of the above answers are "NO", then may proceed with Cephalosporin use.   . Sulfa Antibiotics Swelling    Swelling of hands and throat   .  Famotidine Other (See Comments)    Bloating of stomach, loss of appetite  . Levocetirizine Other (See Comments)    Muscle cramps    Immunization History  Administered Date(s) Administered  . Influenza Split 08/09/2013, 08/03/2014  . Influenza Whole 07/28/2017  . Influenza,inj,Quad PF,6+ Mos 07/31/2015, 07/29/2016, 06/24/2018  . Moderna SARS-COVID-2 Vaccination 03/19/2020, 04/26/2020  . Pneumococcal Conjugate-13 07/31/2015  . Pneumococcal Polysaccharide-23 08/03/2014    Past Medical History:  Diagnosis Date  . Arthritis   . COPD (chronic obstructive pulmonary disease) ([place]) 2004   diagnosed in 2004, no PFT's to date.  Started on home O2 12/2013, after found to be desatting at PCP's office, and referred to pulmonology.  . Depression   . GERD (gastroesophageal reflux disease)   . High blood pressure   . Prediabetes   . Seasonal allergies   . Tobacco dependence     Tobacco History: Social History   Tobacco Use  Smoking Status Current Some Day Smoker  . Packs/day: 0.00  . Years: 36.00  . Pack years: 0.00  . Types: Cigarettes  Smokeless Tobacco Never Used  Tobacco Comment   down to 5-6 cigs a day   Ready to quit: Not Answered Counseling given: Not Answered Comment: down to 5-6 cigs a day   Outpatient Medications Prior to Visit  Medication Sig Dispense Refill  . albuterol (PROVENTIL) (2.5 MG/3ML) 0.083% nebulizer solution Take 3 mLs (2.5 mg total) by nebulization every 4 (four)  hours as needed for wheezing or shortness of breath. 75 mL 3  . benztropine (COGENTIN) 0.5 MG tablet Take 0.5 mg by mouth at bedtime.     . budesonide-formoterol (SYMBICORT) 160-4.5 MCG/ACT inhaler INHALE TWO PUFFS BY MOUTH INTO THE LUNGS TWICE A DAY 1 Inhaler 3  . cetirizine (ZYRTEC) 10 MG tablet Take 10 mg by mouth daily.    . famotidine (PEPCID) 20 MG tablet TAKE ONE TABLET BY MOUTH DAILY AT BEDTIME 30 tablet 2  . fluticasone (FLONASE) 50 MCG/ACT nasal spray Place 2 sprays into both nostrils 3  (three) times daily as needed for allergies.     . hydrochlorothiazide (MICROZIDE) 12.5 MG capsule Take 1 capsule (12.5 mg total) by mouth in the morning. 90 capsule 1  . latanoprost (XALATAN) 0.005 % ophthalmic solution Place 1 drop into both eyes at bedtime.    Marland Kitchen losartan (COZAAR) 100 MG tablet Take 100 mg by mouth daily.    Marland Kitchen loxapine (LOXITANE) 10 MG capsule Take 10 mg by mouth at bedtime.     . mirtazapine (REMERON) 30 MG tablet Take 30 mg by mouth at bedtime.    . Multiple Vitamin (MULTIVITAMIN) capsule Take 1 capsule by mouth daily.    . OXYGEN Inhale 2 L into the lungs as directed. 2-3 liters with sleep and exertion as needed for low oxygen    . pantoprazole (PROTONIX) 40 MG tablet Take 1 tablet (40 mg total) by mouth daily. Take 30-60 min before first meal of the day 30 tablet 2  . polyethylene glycol (MIRALAX / GLYCOLAX) 17 g packet TAKE 17 GRAMS BY MOUTH DAILY 14 each 1  . Respiratory Therapy Supplies (FLUTTER) DEVI Use as directed 1 each 0  . risperiDONE (RISPERDAL) 2 MG tablet Take 2 mg by mouth at bedtime.    Marland Kitchen SPIRIVA RESPIMAT 2.5 MCG/ACT AERS INHALE TWO PUFFS BY MOUTH EVERY MORNING 4 g 6  . albuterol (PROAIR HFA) 108 (90 Base) MCG/ACT inhaler Inhale 2 puffs into the lungs every 4 (four) hours as needed. INHALE TWO PUFFS UP TO EVERY 4 HOURS IF NEEDED FOR SHORT OF BREATH 18 g 3  . predniSONE (DELTASONE) 10 MG tablet Take 1 tablet (10 mg total) by mouth daily with breakfast. 100 tablet 0   No facility-administered medications prior to visit.   Review of Systems  Review of Systems  Constitutional: Negative.   Respiratory: Positive for cough and shortness of breath.   Cardiovascular: Negative.    Physical Exam  BP 118/78 (BP Location: Left Arm, Cuff Size: Normal)   Pulse 88   Temp 98.4 F (36.9 C) (Oral)   Ht 5\' 11"  (1.803 m)   Wt 154 lb 6.4 oz (70 kg)   SpO2 96%   BMI 21.53 kg/m  Physical Exam Constitutional:      Appearance: Normal appearance.  HENT:     Head:  Normocephalic and atraumatic.  Cardiovascular:     Rate and Rhythm: Normal rate and regular rhythm.  Pulmonary:     Effort: Pulmonary effort is normal.     Breath sounds: Normal breath sounds.     Comments: Diminished Neurological:     General: No focal deficit present.     Mental Status: He is alert and oriented to person, place, and time. Mental status is at baseline.  Psychiatric:        Mood and Affect: Mood normal.        Behavior: Behavior normal.        Thought Content:  Thought content normal.        Judgment: Judgment normal.      Lab Results:  CBC    Component Value Date/Time   WBC 10.5 02/06/2020 2001   RBC 4.73 02/06/2020 2001   HGB 14.5 02/06/2020 2001   HCT 47.2 02/06/2020 2001   PLT 357 02/06/2020 2001   MCV 99.8 02/06/2020 2001   MCH 30.7 02/06/2020 2001   MCHC 30.7 02/06/2020 2001   RDW 15.1 02/06/2020 2001   LYMPHSABS 1.6 08/28/2019 0001   MONOABS 1.4 (H) 08/28/2019 0001   EOSABS 0.0 08/28/2019 0001   BASOSABS 0.1 08/28/2019 0001    BMET    Component Value Date/Time   NA 144 03/17/2020 1349   K 4.0 03/17/2020 1349   CL 103 03/17/2020 1349   CO2 27 03/17/2020 1349   GLUCOSE 132 (H) 03/17/2020 1349   GLUCOSE 138 (H) 02/06/2020 2001   BUN 11 03/17/2020 1349   CREATININE 0.94 03/17/2020 1349   CALCIUM 9.5 03/17/2020 1349   GFRNONAA 90 03/17/2020 1349   GFRAA 104 03/17/2020 1349    BNP    Component Value Date/Time   BNP 20.4 02/06/2020 2001    ProBNP    Component Value Date/Time   PROBNP 29.2 07/09/2014 0955    Imaging: No results found.   Assessment & Plan:   COPD  GOLD III - He is doing quite well since starting pulmonary rehab and quitting smoking in June 2021 - Continue Symbicort 160 2 puffs twice daily and Spiriva Respimat - Continue albuterol rescue inhaler 2 puffs every 4-6 hours as needed for breakthrough shortness of breath or wheezing - Take Prednisone 10mg  daily (1 tab) alternating with 5mg  every other day (1/2 day) -  Please use incentive spirometer 10 breaths an hour while awake to work on deep breathing exercises - FU in 3 months with Dr. [name] or sooner if needed   Chronic respiratory failure with hypoxia ([place]) - Patient re-qualified for oxygen; O2 desaturated 88% on RA, he needed 4L to keep O2 >95%. Renew order with [facility] and re-send POC order    [name], NP 05/04/2020

## 2020-05-03 NOTE — Patient Instructions (Addendum)
Congratulations on quitting smoking!!!  COPD Recommendations: - Take Prednisone 10mg  daily (1 tab) alternating with 5mg  every other day (1/2 day) - Continue Symbicort 2 puffs twice daily and Spiriva Respimat - Continue albuterol rescue inhaler 2 puffs every 4-6 hours as needed for breakthrough shortness of breath or wheezing - Please use incentive spirometer 10 breaths an hour while awake to work on deep breathing exercises  Chronic respiratory failure: - Needs to use 4L on exertion   Orders: - Renew oxygen order with [facility] and re-send POC order  - Incentive spirometer   Rx: - Prednisone and Proair inhaler sent to pharmacy on file  Follow-up: - 3 months with Dr. [name] or sooner if needed

## 2020-05-04 ENCOUNTER — Encounter: Payer: Self-pay | Admitting: Primary Care

## 2020-05-04 NOTE — Assessment & Plan Note (Addendum)
-   He is doing quite well since starting pulmonary rehab and quitting smoking in June 2021 - Continue Symbicort 160 2 puffs twice daily and Spiriva Respimat - Continue albuterol rescue inhaler 2 puffs every 4-6 hours as needed for breakthrough shortness of breath or wheezing - Take Prednisone 10mg  daily (1 tab) alternating with 5mg  every other day (1/2 day) - Please use incentive spirometer 10 breaths an hour while awake to work on deep breathing exercises - FU in 3 months with Dr. [name] or sooner if needed

## 2020-05-04 NOTE — Assessment & Plan Note (Addendum)
-   Patient re-qualified for oxygen; O2 desaturated 88% on RA, he needed 4L to keep O2 >95%. Renew order with [facility] and re-send POC order

## 2020-05-05 ENCOUNTER — Ambulatory Visit: Payer: Medicaid Other

## 2020-05-08 ENCOUNTER — Telehealth: Payer: Self-pay | Admitting: Internal Medicine

## 2020-05-08 DIAGNOSIS — J449 Chronic obstructive pulmonary disease, unspecified: Secondary | ICD-10-CM

## 2020-05-08 NOTE — Telephone Encounter (Signed)
Spoke with the pt  He states that he now wants to have his POC order sent to another DME b/c he does not want his credit card on file and also they do not prefer [facility].  PCC's can you try sending to another DME for him please? Thanks!

## 2020-05-09 ENCOUNTER — Ambulatory Visit: Payer: Medicaid Other

## 2020-05-09 NOTE — Telephone Encounter (Signed)
A new order has been placed for the oxygen.  Please advise if anything else is needed.

## 2020-05-09 NOTE — Telephone Encounter (Signed)
Can we get a new order since this one has [facility] in the order since pt wants to switch DME

## 2020-05-10 ENCOUNTER — Telehealth: Payer: Self-pay | Admitting: Primary Care

## 2020-05-10 ENCOUNTER — Telehealth: Payer: Self-pay | Admitting: Internal Medicine

## 2020-05-10 DIAGNOSIS — J449 Chronic obstructive pulmonary disease, unspecified: Secondary | ICD-10-CM

## 2020-05-10 MED ORDER — ALBUTEROL SULFATE HFA 108 (90 BASE) MCG/ACT IN AERS: 2.0000 | INHALATION_SPRAY | RESPIRATORY_TRACT | 3 refills | Status: DC | PRN

## 2020-05-10 MED ORDER — BUDESONIDE-FORMOTEROL FUMARATE 160-4.5 MCG/ACT IN AERO: INHALATION_SPRAY | RESPIRATORY_TRACT | 3 refills | Status: DC

## 2020-05-10 MED ORDER — SPIRIVA RESPIMAT 2.5 MCG/ACT IN AERS: INHALATION_SPRAY | RESPIRATORY_TRACT | 3 refills | Status: DC

## 2020-05-10 NOTE — Telephone Encounter (Signed)
Spoke with the pt  I refilled his proair, symbicort and spiriva per his request  Nothing further needed

## 2020-05-10 NOTE — Telephone Encounter (Signed)
[facility] needed a new order for concentrator and POC. Reports previous order was not approved due to the way it was written. New DME order placed.

## 2020-05-11 ENCOUNTER — Other Ambulatory Visit: Payer: Self-pay

## 2020-05-11 ENCOUNTER — Ambulatory Visit: Payer: Medicaid Other | Attending: Student

## 2020-05-11 ENCOUNTER — Telehealth: Payer: Self-pay | Admitting: Internal Medicine

## 2020-05-11 DIAGNOSIS — M6281 Muscle weakness (generalized): Secondary | ICD-10-CM | POA: Insufficient documentation

## 2020-05-11 DIAGNOSIS — R2689 Other abnormalities of gait and mobility: Secondary | ICD-10-CM | POA: Diagnosis present

## 2020-05-11 MED ORDER — ALBUTEROL SULFATE HFA 108 (90 BASE) MCG/ACT IN AERS: 2.0000 | INHALATION_SPRAY | RESPIRATORY_TRACT | 3 refills | Status: DC | PRN

## 2020-05-11 NOTE — Telephone Encounter (Signed)
Called and spoke with pt. Pt is requesting to have his proair inhaler sent to [facility] in [place]. Rx has been sent for pt. Nothing further needed.

## 2020-05-14 NOTE — Therapy (Signed)
[place], [place], [address] Phone: [phone]   Fax:  [phone]  Physical Therapy Treatment  Patient Details  Name: ZAKKERY DORIAN MRN: [id number] Date of Birth: 09/01/1961 Referring Provider (PT): Glenford Peers NP   Encounter Date: 05/11/2020   PT End of Session - 05/14/20 1449    Visit Number 6    Number of Visits 16    Authorization Type MCD - re-auth submitted    Authorization Time Period Submitted for 12 visits    Authorization - Number of Visits 12    PT Start Time 1403    PT Stop Time 1448    PT Time Calculation (min) 45 min    Activity Tolerance Patient limited by fatigue;Other (comment)    Behavior During Therapy WFL for tasks assessed/performed           Past Medical History:  Diagnosis Date  . Arthritis   . COPD (chronic obstructive pulmonary disease) ([place]) 2004   diagnosed in 2004, no PFT's to date.  Started on home O2 12/2013, after found to be desatting at PCP's office, and referred to pulmonology.  . Depression   . GERD (gastroesophageal reflux disease)   . High blood pressure   . Prediabetes   . Seasonal allergies   . Tobacco dependence     Past Surgical History:  Procedure Laterality Date  . ANTERIOR CERVICAL DECOMP/DISCECTOMY FUSION N/A 09/06/2019   Procedure: Anterior Cervical Discecectomy Fusion - Cerivcal Five-Cervical Six;  Surgeon: Kary Kos, MD;  Location: [facility];  Service: Neurosurgery;  Laterality: N/A;  Anterior Cervical Discecectomy Fusion - Cerivcal Five-Cervical Six  . ANTERIOR CERVICAL DISCECTOMY  09/06/2019  . APPENDECTOMY  1980  . COLONOSCOPY WITH PROPOFOL N/A 12/10/2016   Procedure: COLONOSCOPY WITH PROPOFOL;  Surgeon: [name], MD;  Location: [facility];  Service: Endoscopy;  Laterality: N/A;  . HIP SURGERY Right   . SHOULDER ARTHROSCOPY Right     There were no vitals filed for this visit.   Subjective Assessment - 05/14/20 1446    Subjective  Pt reports he wants to concentrate on his standing and walking tolerance.                             [facility] Adult PT Treatment/Exercise - 05/14/20 0001      Self-Care   Self-Care Other Self-Care Comments    Other Self-Care Comments  Managing RPE per modified Berg and maintaining conversation during ex/activity. Instruction in pursed lip breathing ofr management and recovery of DOE. Pt returned demonstration      Knee/Hip Exercises: Aerobic   Nustep 9 mins; L4; UE/LE; Initially O2 sat 93%, HR 96; After 3 mins- O2 sat 93%, HR103; RPE 3/10; After 6 mins- O2 sat 94%, HR 106, RPE 4/10; After 9 mins- O2 sat 94%, HR 110, RPE 4/10      Knee/Hip Exercises: Standing   Hip Flexion 20 reps    Hip Flexion Limitations Marching; O2 sat 91%, HR 112; RPE 7/10    Gait Training Pt arrived in a power WC, but walked in the clinic with a SPC c SBA for safety    Other Standing Knee Exercises Sit t/f standing 5X; O2 sat 88%, 100, RPE 4/10    Other Standing Knee Exercises Side stepping 5 ft, 10x; O2 sat 91%, HR 112, RPE 4/10  PT Education - 05/14/20 1446    Education Details Managing RPE per modified Berg and maintaining conversation during ex/activity. Instruction in pursed lip breathing ofr management and recovery of DOE. Pt returned demonstration    Person(s) Educated Patient    Methods Explanation;Demonstration;Verbal cues    Comprehension Verbalized understanding;Returned demonstration;Verbal cues required;Tactile cues required;Need further instruction            PT Short Term Goals - 04/24/20 1612      PT SHORT TERM GOAL #1   Title I with initial HEP    Baseline not performing any exercise    Time 4    Period Weeks    Status Achieved    Target Date 04/19/20      PT SHORT TERM GOAL #2   Title ambulate 5' with straight cane keeping oxygen sats above 92%    Baseline 75' O2 drops 80% on no O2    Time 4    Period Weeks    Status On-going    Target  Date 04/19/20      PT SHORT TERM GOAL #3   Title improve Rt LE strength =/> 4+/5 to assist with gait    Baseline R ankle DF 3+/5, PF 4/5. Otherwise, R LE 4+/5    Time 4    Period Weeks    Status On-going    Target Date 04/19/20             PT Long Term Goals - 04/24/20 1613      PT LONG TERM GOAL #1   Title Pt will be able to tolerate standing activities >/= 10 minutes with stable spO2 >90% on O2    Baseline Unable to tolerate >/=5 min    Time 6    Period Weeks    Status New    Target Date 06/05/20      PT LONG TERM GOAL #2   Title Pt will be able to ascend/descend 5 steps without railing and stable spO2 >90% on O2    Baseline Unable    Time 6    Period Weeks    Status New    Target Date 06/05/20      PT LONG TERM GOAL #3   Title Pt will be able to perfrom 5x sit<>stand <13 sec for reduced fall risk with spO2 >/= 90% on O2    Baseline 15 sec; spO2 83%    Time 6    Period Weeks    Status New    Target Date 06/05/20      PT LONG TERM GOAL #4   Title Pt will be independent with advanced HEP    Baseline Limited    Time 6    Period Weeks    Status New    Target Date 06/05/20      PT LONG TERM GOAL #5   Title Pt has goals to amb 20 minutes with rest breaks as needed    Baseline Unable    Time 6    Period Weeks    Status New    Target Date 06/05/20                 Plan - 05/14/20 1450    Clinical Impression Statement Pt used O2 at 4L, pulsed during the course of PT. PT focused on activity tolerance, managing RPE, and using pursed lip breathing for managing and recovery of DOE. Pt understood both elements and returned demonstation. Pt is to utilize sie stepping, sit t/f  standing, and marching as ways to challenge his activity tolerance while following the measures to manage and recover from exertion. Pt returned demonstration of these elements.    Personal Factors and Comorbidities Comorbidity 3+    Comorbidities ACDF, lumbar compression fx, severe COPD O2  dependent, depressin, arthritis, GERD HTN, Rt hip and shoulder surgeries    Examination-Activity Limitations Bathing;Bed Mobility;Locomotion Level;Dressing;Stand;Stairs;Hygiene/Grooming    Examination-Participation Restrictions Community Activity;Meal Prep    Stability/Clinical Decision Making Evolving/Moderate complexity    Clinical Decision Making Moderate    Rehab Potential Fair    PT Frequency 2x / week    PT Duration 4 weeks    PT Treatment/Interventions Gait training;Taping;Patient/family education;Functional mobility training;Moist Heat;Passive range of motion;Therapeutic exercise;Cryotherapy;Electrical Stimulation;Neuromuscular re-education;Balance training;Manual techniques    PT Next Visit Plan Assess response to activities to appropriately challenge endurance.    PT Home Exercise Plan Access Code: [email or web address]. Side stepping, marching and sit t/f standing for activity tolerance.    Consulted and Agree with Plan of Care Patient           Patient will benefit from skilled therapeutic intervention in order to improve the following deficits and impairments:  Abnormal gait, Difficulty walking, Decreased strength, Decreased balance, Decreased activity tolerance, Cardiopulmonary status limiting activity  Visit Diagnosis: Muscle weakness (generalized)  Other abnormalities of gait and mobility     Problem List Patient Active Problem List   Diagnosis Date Noted  . HNP (herniated nucleus pulposus), cervical 09/06/2019  . Abdominal pain   . Poor dentition 11/04/2017  . Acute on chronic respiratory failure with hypoxia ([place]) 08/29/2017  . ETOH abuse 03/30/2017  . Cigarette smoker 03/30/2017  . Acute respiratory failure with hypoxia and hypercapnia ([place]) 03/29/2017  . Special screening for malignant neoplasms, colon   . Benign neoplasm of ascending colon   . Benign neoplasm of descending colon   . Hoarseness 08/22/2016  . GERD (gastroesophageal reflux disease) 08/22/2016  .  Atypical chest pain 12/05/2015  . Essential hypertension 06/15/2014  . Pectoralis muscle strain 01/11/2014  . COPD with acute exacerbation ([place]) 01/10/2014  . COPD  GOLD III 01/08/2014  . Smoker 01/08/2014  . Cough 01/08/2014  . Chronic respiratory failure with hypoxia ([place]) 01/08/2014    Gar Ponto MS, PT 05/14/20 3:02 PM  [facility] [address] [place], [place], [address] Phone: [phone]   Fax:  [phone]  Name: RAYSHON ALBAUGH MRN: [id number] Date of Birth: 22-Oct-1961

## 2020-05-15 ENCOUNTER — Telehealth (HOSPITAL_COMMUNITY): Payer: Self-pay | Admitting: Physical Therapy

## 2020-05-15 ENCOUNTER — Telehealth (HOSPITAL_COMMUNITY): Payer: Self-pay

## 2020-05-15 NOTE — Telephone Encounter (Signed)
Called pt back L/m for Mr. Witters to call us back to talk about transferring to our location-nearer to his home.[facility]

## 2020-05-15 NOTE — Telephone Encounter (Signed)
Pt called requested to transfer to [place] closer to his home. Requested that we call [place] to cx future apptments and r/s - he will used [facility] for transportation - Auth from [place] good until 05/11/20.

## 2020-05-16 ENCOUNTER — Ambulatory Visit: Payer: Medicaid Other

## 2020-05-16 ENCOUNTER — Other Ambulatory Visit: Payer: Self-pay | Admitting: Cardiology

## 2020-05-16 DIAGNOSIS — I10 Essential (primary) hypertension: Secondary | ICD-10-CM

## 2020-05-18 ENCOUNTER — Ambulatory Visit: Payer: Medicaid Other

## 2020-05-23 ENCOUNTER — Observation Stay (HOSPITAL_BASED_OUTPATIENT_CLINIC_OR_DEPARTMENT_OTHER): Payer: Medicaid Other

## 2020-05-23 ENCOUNTER — Encounter (HOSPITAL_COMMUNITY): Payer: Self-pay | Admitting: Emergency Medicine

## 2020-05-23 ENCOUNTER — Other Ambulatory Visit: Payer: Self-pay

## 2020-05-23 ENCOUNTER — Emergency Department (HOSPITAL_COMMUNITY): Payer: Medicaid Other

## 2020-05-23 ENCOUNTER — Observation Stay (HOSPITAL_COMMUNITY)
Admission: EM | Admit: 2020-05-23 | Discharge: 2020-05-24 | Disposition: A | Discharge status: Home / Self Care | Payer: Medicaid Other | Attending: Family Medicine | Admitting: Family Medicine

## 2020-05-23 DIAGNOSIS — K219 Gastro-esophageal reflux disease without esophagitis: Secondary | ICD-10-CM | POA: Diagnosis not present

## 2020-05-23 DIAGNOSIS — J9611 Chronic respiratory failure with hypoxia: Secondary | ICD-10-CM | POA: Diagnosis not present

## 2020-05-23 DIAGNOSIS — F17200 Nicotine dependence, unspecified, uncomplicated: Secondary | ICD-10-CM | POA: Diagnosis present

## 2020-05-23 DIAGNOSIS — R0789 Other chest pain: Secondary | ICD-10-CM | POA: Diagnosis not present

## 2020-05-23 DIAGNOSIS — F319 Bipolar disorder, unspecified: Secondary | ICD-10-CM | POA: Diagnosis not present

## 2020-05-23 DIAGNOSIS — Z20822 Contact with and (suspected) exposure to covid-19: Secondary | ICD-10-CM | POA: Insufficient documentation

## 2020-05-23 DIAGNOSIS — R778 Other specified abnormalities of plasma proteins: Secondary | ICD-10-CM

## 2020-05-23 DIAGNOSIS — R079 Chest pain, unspecified: Secondary | ICD-10-CM

## 2020-05-23 DIAGNOSIS — E785 Hyperlipidemia, unspecified: Secondary | ICD-10-CM | POA: Diagnosis not present

## 2020-05-23 DIAGNOSIS — I10 Essential (primary) hypertension: Secondary | ICD-10-CM | POA: Diagnosis not present

## 2020-05-23 DIAGNOSIS — E876 Hypokalemia: Secondary | ICD-10-CM | POA: Diagnosis not present

## 2020-05-23 DIAGNOSIS — Z720 Tobacco use: Secondary | ICD-10-CM | POA: Diagnosis present

## 2020-05-23 DIAGNOSIS — J449 Chronic obstructive pulmonary disease, unspecified: Secondary | ICD-10-CM | POA: Diagnosis not present

## 2020-05-23 DIAGNOSIS — Z9981 Dependence on supplemental oxygen: Secondary | ICD-10-CM | POA: Insufficient documentation

## 2020-05-23 DIAGNOSIS — R748 Abnormal levels of other serum enzymes: Secondary | ICD-10-CM | POA: Insufficient documentation

## 2020-05-23 DIAGNOSIS — E782 Mixed hyperlipidemia: Secondary | ICD-10-CM

## 2020-05-23 DIAGNOSIS — Z79899 Other long term (current) drug therapy: Secondary | ICD-10-CM | POA: Insufficient documentation

## 2020-05-23 LAB — BASIC METABOLIC PANEL
Anion gap: 11 (ref 5–15)
BUN: 17 mg/dL (ref 6–20)
CO2: 27 mmol/L (ref 22–32)
Calcium: 8.9 mg/dL (ref 8.9–10.3)
Chloride: 101 mmol/L (ref 98–111)
Creatinine, Ser: 1.13 mg/dL (ref 0.61–1.24)
GFR calc Af Amer: >60 mL/min (ref >60)
GFR calc non Af Amer: >60 mL/min (ref >60)
Glucose, Bld: 93 mg/dL (ref 70–99)
Potassium: 3.3 mmol/L — ABNORMAL LOW (ref 3.5–5.1)
Sodium: 139 mmol/L (ref 135–145)

## 2020-05-23 LAB — TROPONIN I (HIGH SENSITIVITY)
Troponin I (High Sensitivity): 40 ng/L — ABNORMAL HIGH (ref <18)
Troponin I (High Sensitivity): 39 ng/L — ABNORMAL HIGH (ref <18)

## 2020-05-23 LAB — CBC
HCT: 50.9 % (ref 39.0–52.0)
Hemoglobin: 16.3 g/dL (ref 13.0–17.0)
MCH: 31.3 pg (ref 26.0–34.0)
MCHC: 32 g/dL (ref 30.0–36.0)
MCV: 97.9 fL (ref 80.0–100.0)
Platelets: 290 10*3/uL (ref 150–400)
RBC: 5.2 MIL/uL (ref 4.22–5.81)
RDW: 15.3 % (ref 11.5–15.5)
WBC: 15.4 10*3/uL — ABNORMAL HIGH (ref 4.0–10.5)
nRBC: 0 % (ref 0.0–0.2)

## 2020-05-23 LAB — ECHOCARDIOGRAM COMPLETE
Area-P 1/2: 3.23 cm2
Height: 71 in
S' Lateral: 3.96 cm
Weight: 2464 oz

## 2020-05-23 LAB — RAPID URINE DRUG SCREEN, HOSP PERFORMED
Amphetamines: NOT DETECTED
Barbiturates: NOT DETECTED
Benzodiazepines: NOT DETECTED
Cocaine: NOT DETECTED
Opiates: NOT DETECTED
Tetrahydrocannabinol: NOT DETECTED

## 2020-05-23 LAB — LIPID PANEL
Cholesterol: 206 mg/dL — ABNORMAL HIGH (ref 0–200)
HDL: 64 mg/dL (ref >40)
LDL Cholesterol: 113 mg/dL — ABNORMAL HIGH (ref 0–99)
Total CHOL/HDL Ratio: 3.2 RATIO
Triglycerides: 144 mg/dL (ref <150)
VLDL: 29 mg/dL (ref 0–40)

## 2020-05-23 LAB — SARS CORONAVIRUS 2 BY RT PCR (HOSPITAL ORDER, PERFORMED IN ~~LOC~~ HOSPITAL LAB): SARS Coronavirus 2: NEGATIVE

## 2020-05-23 LAB — TSH: TSH: 1.484 u[IU]/mL (ref 0.350–4.500)

## 2020-05-23 LAB — MAGNESIUM: Magnesium: 2.4 mg/dL (ref 1.7–2.4)

## 2020-05-23 LAB — HIV ANTIBODY (ROUTINE TESTING W REFLEX): HIV Screen 4th Generation wRfx: NONREACTIVE

## 2020-05-23 LAB — BRAIN NATRIURETIC PEPTIDE: B Natriuretic Peptide: 41 pg/mL (ref 0.0–100.0)

## 2020-05-23 IMAGING — DX DG CHEST 1V PORT
1 series · 2 of 2 positions shown · non-contrast
Comparison: [DATE]

CLINICAL DATA: Chest pain

EXAM:
PORTABLE CHEST 1 VIEW

[Series 1: chest ap · 0.14mm/px · 2 of 2 slices shown]
[im 1/2]
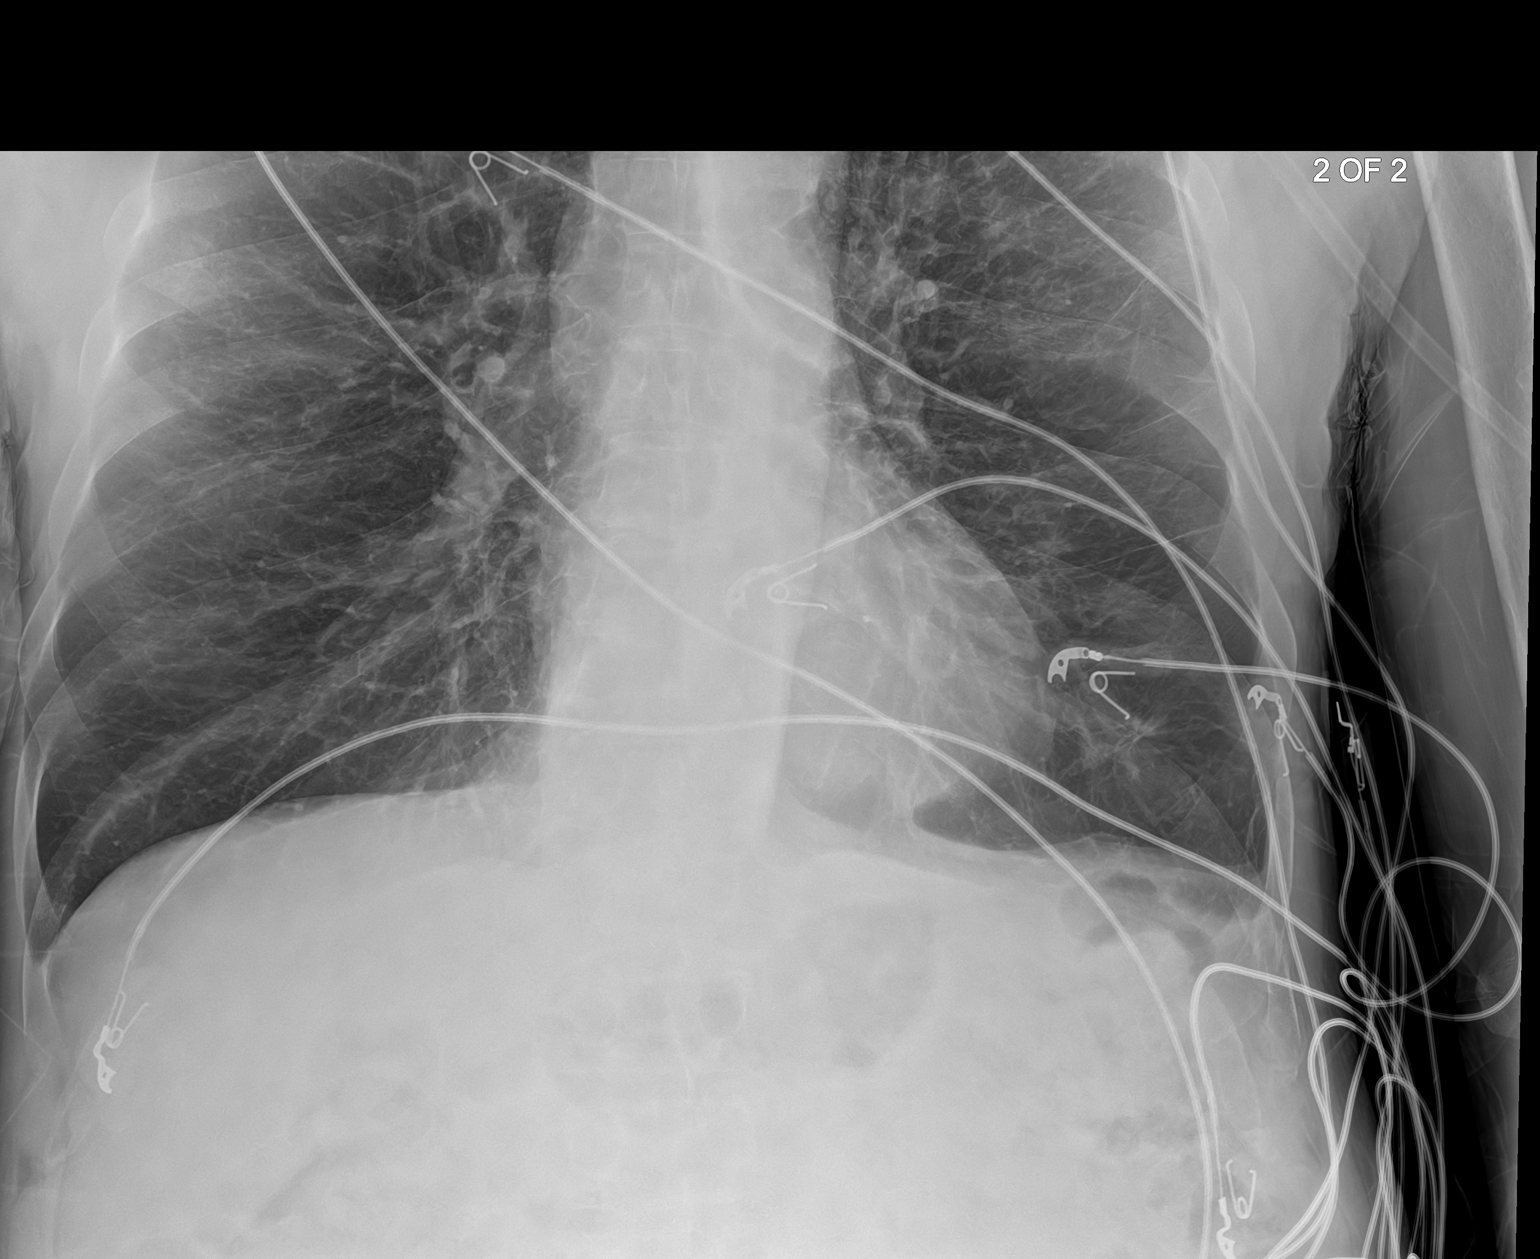
[im 2/2]
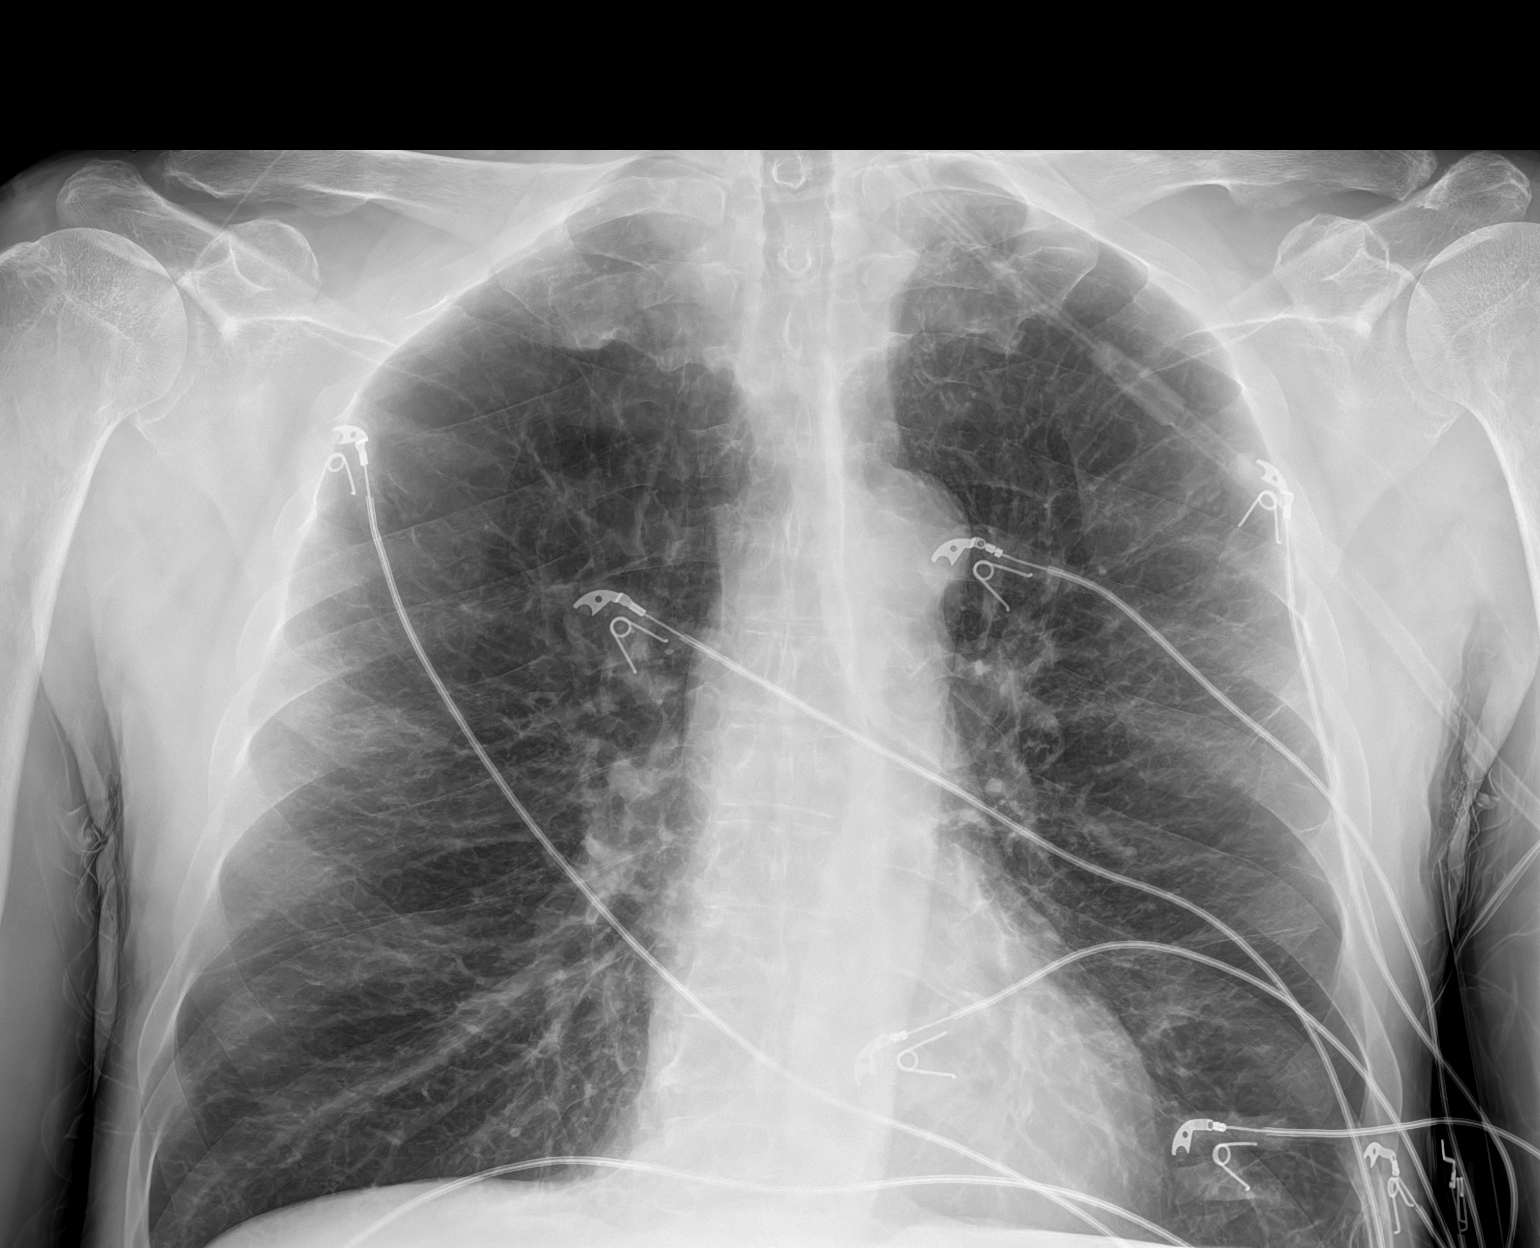

[2 of 2 positions shown; findings below may reference images not displayed]

FINDINGS: Lungs remain hyperexpanded. There is scarring throughout the lungs
bilaterally, unchanged. There is no frank edema or airspace opacity.
The heart size is normal. The pulmonary vascularity is stable with
relative decreased vascularity in the upper lung regions, likely due
to underlying emphysematous change. No adenopathy evident.
Postoperative change noted in lower cervical region.
IMPRESSION: Lungs hyperexpanded with apparent underlying emphysematous change.
There are areas of scarring bilaterally. No edema or airspace
opacity. Stable cardiac silhouette.

## 2020-05-23 MED ORDER — ASPIRIN 81 MG PO CHEW
324.0000 mg | CHEWABLE_TABLET | Freq: Once | ORAL | Status: AC
  Administered 2020-05-23: 324 mg via ORAL
  Filled 2020-05-23: qty 4

## 2020-05-23 MED ORDER — ALUM & MAG HYDROXIDE-SIMETH 200-200-20 MG/5ML PO SUSP
30.0000 mL | Freq: Once | ORAL | Status: AC
  Administered 2020-05-23: 30 mL via ORAL
  Filled 2020-05-23: qty 30

## 2020-05-23 MED ORDER — UMECLIDINIUM BROMIDE 62.5 MCG/INH IN AEPB
1.0000 | INHALATION_SPRAY | Freq: Every day | RESPIRATORY_TRACT | Status: DC
  Administered 2020-05-23 – 2020-05-24 (×2): 1 via RESPIRATORY_TRACT
  Filled 2020-05-23: qty 7

## 2020-05-23 MED ORDER — ALBUTEROL SULFATE (2.5 MG/3ML) 0.083% IN NEBU: 2.5000 mg | INHALATION_SOLUTION | RESPIRATORY_TRACT | Status: DC | PRN

## 2020-05-23 MED ORDER — NITROGLYCERIN 0.4 MG SL SUBL
0.4000 mg | SUBLINGUAL_TABLET | SUBLINGUAL | Status: DC | PRN
  Administered 2020-05-23: 0.4 mg via SUBLINGUAL
  Filled 2020-05-23: qty 1

## 2020-05-23 MED ORDER — LIDOCAINE VISCOUS HCL 2 % MT SOLN
15.0000 mL | Freq: Once | OROMUCOSAL | Status: AC
  Administered 2020-05-23: 15 mL via ORAL
  Filled 2020-05-23: qty 15

## 2020-05-23 MED ORDER — PANTOPRAZOLE SODIUM 40 MG IV SOLR
40.0000 mg | Freq: Once | INTRAVENOUS | Status: AC
  Administered 2020-05-23: 40 mg via INTRAVENOUS
  Filled 2020-05-23: qty 40

## 2020-05-23 MED ORDER — MIRTAZAPINE 30 MG PO TABS
30.0000 mg | ORAL_TABLET | Freq: Every day | ORAL | Status: DC
  Filled 2020-05-23: qty 1

## 2020-05-23 MED ORDER — ENOXAPARIN SODIUM 40 MG/0.4ML ~~LOC~~ SOLN
40.0000 mg | SUBCUTANEOUS | Status: DC
  Administered 2020-05-23 – 2020-05-24 (×2): 40 mg via SUBCUTANEOUS
  Filled 2020-05-23 (×2): qty 0.4

## 2020-05-23 MED ORDER — LOSARTAN POTASSIUM 50 MG PO TABS
100.0000 mg | ORAL_TABLET | Freq: Every day | ORAL | Status: DC
  Administered 2020-05-23 – 2020-05-24 (×2): 100 mg via ORAL
  Filled 2020-05-23: qty 2
  Filled 2020-05-23: qty 4

## 2020-05-23 MED ORDER — ONDANSETRON HCL 4 MG/2ML IJ SOLN: 4.0000 mg | Freq: Four times a day (QID) | INTRAMUSCULAR | Status: DC | PRN

## 2020-05-23 MED ORDER — NITROGLYCERIN 2 % TD OINT
1.0000 [in_us] | TOPICAL_OINTMENT | Freq: Once | TRANSDERMAL | Status: AC
  Administered 2020-05-23: 1 [in_us] via TOPICAL
  Filled 2020-05-23: qty 1

## 2020-05-23 MED ORDER — MOMETASONE FURO-FORMOTEROL FUM 200-5 MCG/ACT IN AERO
2.0000 | INHALATION_SPRAY | Freq: Two times a day (BID) | RESPIRATORY_TRACT | Status: DC
  Administered 2020-05-23 – 2020-05-24 (×3): 2 via RESPIRATORY_TRACT
  Filled 2020-05-23: qty 8.8

## 2020-05-23 MED ORDER — RISPERIDONE 1 MG PO TABS
2.0000 mg | ORAL_TABLET | Freq: Every day | ORAL | Status: DC
  Filled 2020-05-23: qty 2

## 2020-05-23 MED ORDER — PREDNISONE 10 MG PO TABS
10.0000 mg | ORAL_TABLET | Freq: Every day | ORAL | Status: DC
  Administered 2020-05-23 – 2020-05-24 (×2): 10 mg via ORAL
  Filled 2020-05-23 (×2): qty 1

## 2020-05-23 MED ORDER — ASPIRIN EC 81 MG PO TBEC
81.0000 mg | DELAYED_RELEASE_TABLET | Freq: Every day | ORAL | Status: DC
  Administered 2020-05-23 – 2020-05-24 (×2): 81 mg via ORAL
  Filled 2020-05-23 (×2): qty 1

## 2020-05-23 MED ORDER — ACETAMINOPHEN 325 MG PO TABS: 650.0000 mg | ORAL_TABLET | ORAL | Status: DC | PRN

## 2020-05-23 MED ORDER — POTASSIUM CHLORIDE CRYS ER 20 MEQ PO TBCR
40.0000 meq | EXTENDED_RELEASE_TABLET | Freq: Once | ORAL | Status: AC
  Administered 2020-05-23: 40 meq via ORAL
  Filled 2020-05-23: qty 2

## 2020-05-23 MED ORDER — NICOTINE 14 MG/24HR TD PT24
14.0000 mg | MEDICATED_PATCH | Freq: Every day | TRANSDERMAL | Status: DC
  Administered 2020-05-23 – 2020-05-24 (×2): 14 mg via TRANSDERMAL
  Filled 2020-05-23 (×2): qty 1

## 2020-05-23 MED ORDER — ALBUTEROL SULFATE HFA 108 (90 BASE) MCG/ACT IN AERS
2.0000 | INHALATION_SPRAY | Freq: Once | RESPIRATORY_TRACT | Status: AC
  Administered 2020-05-23: 2 via RESPIRATORY_TRACT
  Filled 2020-05-23: qty 6.7

## 2020-05-23 MED ORDER — TIOTROPIUM BROMIDE MONOHYDRATE 2.5 MCG/ACT IN AERS: 2.5000 ug | INHALATION_SPRAY | Freq: Every day | RESPIRATORY_TRACT | Status: DC

## 2020-05-23 MED ORDER — SODIUM CHLORIDE 0.9 % IV SOLN: INTRAVENOUS | Status: DC

## 2020-05-23 MED ORDER — LOXAPINE SUCCINATE 10 MG PO CAPS: 10.0000 mg | ORAL_CAPSULE | Freq: Every day | ORAL | Status: DC

## 2020-05-23 MED ORDER — LATANOPROST 0.005 % OP SOLN
1.0000 [drp] | Freq: Every day | OPHTHALMIC | Status: DC
  Administered 2020-05-23: 1 [drp] via OPHTHALMIC
  Filled 2020-05-23: qty 2.5

## 2020-05-23 MED ORDER — PANTOPRAZOLE SODIUM 40 MG PO TBEC
40.0000 mg | DELAYED_RELEASE_TABLET | Freq: Two times a day (BID) | ORAL | Status: DC
  Administered 2020-05-23 – 2020-05-24 (×3): 40 mg via ORAL
  Filled 2020-05-23 (×3): qty 1

## 2020-05-23 MED ORDER — BENZTROPINE MESYLATE 1 MG PO TABS
0.5000 mg | ORAL_TABLET | Freq: Every day | ORAL | Status: DC
  Filled 2020-05-23: qty 1

## 2020-05-23 NOTE — Consult Note (Addendum)
Cardiology Consult    Patient ID: Tony Sullivan; [id number]; 1961-07-17   Admit date: 05/23/2020 Date of Consult: 05/23/2020  Primary Care Provider: [name], FNP Primary Cardiologist: Dr. Terri Skains [facility]  Patient Profile    Tony Sullivan is a 59 y.o. male with past medical history of COPD (on 2-3L [place]), HTN, PVD, tobacco use and prediabetes who is being seen today for the evaluation of chest pain at the request of Dr. Dyann Kief.   History of Present Illness    Mr. Sayegh was evaluated by Dr. Terri Skains in 03/2020 for evaluation of worsening lower extremity edema. It was thought his symptoms might be secondary to Amlodipine and he was transitioned to HCTZ. Recent echocardiogram had been performed which showed a preserved EF of 60 to 65% with no regional wall motion abnormalities and mild tricuspid regurgitation.  He presented to [facility] ED this morning for evaluation of chest discomfort which has occurred over the past 2 weeks. In talking with the patient today, he reports he consumed a spicy chicken sandwich last week and 2 days after this he developed a burning sensation along his sternal region.  Reports the pain could last for hours at a time and would radiate into his neck and cause a headache.  He did take Prilosec with improvement in his symptoms. He denies any associated nausea, vomiting or diaphoresis. He does have baseline dyspnea on exertion in the setting of COPD along with 2-3 pillow orthopnea. He has experienced worsening lower extremity edema over the past few days but reports this improved with discontinuation of Amlodipine earlier this year.  He does take HCTZ 12.5 mg daily. He uses a motorized wheelchair at baseline due to prior back fractures.  Initial labs show WBC 15.4, Hgb 16.3, platelets 290, Na+ 139, K+ 3.3 and creatinine 1.13. BNP 41. Initial HS Troponin 40 with repeat of 39. FLP shows total cholesterol of 206, HDL 64, triglycerides 144 and LDL 113.  TSH 1.484. COVID pending. CXR consistent with emphysema. EKG shows NSR, HR 77 with atrial enlargement and LVH.    Past Medical History:  Diagnosis Date  . Arthritis   . COPD (chronic obstructive pulmonary disease) ([place]) 2004   diagnosed in 2004, no PFT's to date.  Started on home O2 12/2013, after found to be desatting at PCP's office, and referred to pulmonology.  . Depression   . GERD (gastroesophageal reflux disease)   . High blood pressure   . Prediabetes   . Seasonal allergies   . Tobacco dependence     Past Surgical History:  Procedure Laterality Date  . ANTERIOR CERVICAL DECOMP/DISCECTOMY FUSION N/A 09/06/2019   Procedure: Anterior Cervical Discecectomy Fusion - Cerivcal Five-Cervical Six;  Surgeon: Kary Kos, MD;  Location: [place];  Service: Neurosurgery;  Laterality: N/A;  Anterior Cervical Discecectomy Fusion - Cerivcal Five-Cervical Six  . ANTERIOR CERVICAL DISCECTOMY  09/06/2019  . APPENDECTOMY  1980  . COLONOSCOPY WITH PROPOFOL N/A 12/10/2016   Procedure: COLONOSCOPY WITH PROPOFOL;  Surgeon: [name], MD;  Location: [facility];  Service: Endoscopy;  Laterality: N/A;  . HIP SURGERY Right   . SHOULDER ARTHROSCOPY Right      Home Medications:  Prior to Admission medications   Medication Sig Start Date End Date Taking? Authorizing Provider  albuterol (PROAIR HFA) 108 (90 Base) MCG/ACT inhaler Inhale 2 puffs into the lungs every 4 (four) hours as needed. INHALE TWO PUFFS UP TO EVERY 4 HOURS IF NEEDED FOR SHORT  OF BREATH 05/11/20  Yes [name], MD  albuterol (PROVENTIL) (2.5 MG/3ML) 0.083% nebulizer solution Take 3 mLs (2.5 mg total) by nebulization every 4 (four) hours as needed for wheezing or shortness of breath. 04/28/18  Yes [name], NP  benztropine (COGENTIN) 0.5 MG tablet Take 0.5 mg by mouth at bedtime.    Yes [provider]  budesonide-formoterol (SYMBICORT) 160-4.5 MCG/ACT inhaler INHALE TWO PUFFS BY MOUTH INTO THE LUNGS TWICE A DAY  05/10/20  Yes [name], MD  cetirizine (ZYRTEC) 10 MG tablet Take 10 mg by mouth daily.   Yes [provider]  famotidine (PEPCID) 20 MG tablet TAKE ONE TABLET BY MOUTH DAILY AT BEDTIME 03/08/20  Yes [name], MD  fluticasone (FLONASE) 50 MCG/ACT nasal spray Place 2 sprays into both nostrils 3 (three) times daily as needed for allergies.    Yes [provider]  hydrochlorothiazide (MICROZIDE) 12.5 MG capsule TAKE ONE CAPSULE BY MOUTH EVERY MORNING 05/16/20  Yes Tolia, Sunit, DO  latanoprost (XALATAN) 0.005 % ophthalmic solution Place 1 drop into both eyes at bedtime. 05/24/19  Yes [provider]  losartan (COZAAR) 100 MG tablet Take 100 mg by mouth daily.   Yes [provider]  loxapine (LOXITANE) 10 MG capsule Take 10 mg by mouth at bedtime.    Yes [provider]  mirtazapine (REMERON) 30 MG tablet Take 30 mg by mouth at bedtime.   Yes [provider]  Multiple Vitamin (MULTIVITAMIN) capsule Take 1 capsule by mouth daily.   Yes [provider]  OXYGEN Inhale 2 L into the lungs as directed. 2-4 liters with sleep and exertion as needed for low oxygen   Yes [provider]  pantoprazole (PROTONIX) 40 MG tablet Take 1 tablet (40 mg total) by mouth daily. Take 30-60 min before first meal of the day 08/13/19  Yes [name], MD  polyethylene glycol (MIRALAX / GLYCOLAX) 17 g packet TAKE 17 GRAMS BY MOUTH DAILY 03/09/20  Yes [name], MD  predniSONE (DELTASONE) 10 MG tablet Take 1 tablet (10 mg total) by mouth daily with breakfast. Patient taking differently: Take 20 mg by mouth daily with breakfast. Patient takes 10mg  every other day, and 20 mg on other days. 05/03/20  Yes [name], NP  Respiratory Therapy Supplies (FLUTTER) DEVI Use as directed 12/21/18  Yes [name], MD  risperiDONE (RISPERDAL) 2 MG tablet Take 2 mg by mouth at bedtime.   Yes [provider]  Tiotropium Bromide Monohydrate  (SPIRIVA RESPIMAT) 2.5 MCG/ACT AERS INHALE TWO PUFFS BY MOUTH EVERY MORNING 05/10/20  Yes [name], MD    Inpatient Medications: Scheduled Meds: . aspirin EC  81 mg Oral Daily  . benztropine  0.5 mg Oral QHS  . enoxaparin (LOVENOX) injection  40 mg Subcutaneous Q24H  . latanoprost  1 drop Both Eyes QHS  . losartan  100 mg Oral Daily  . loxapine  10 mg Oral QHS  . mirtazapine  30 mg Oral QHS  . mometasone-formoterol  2 puff Inhalation BID  . nicotine  14 mg Transdermal Daily  . pantoprazole  40 mg Oral BID  . predniSONE  10 mg Oral Q breakfast  . risperiDONE  2 mg Oral QHS  . umeclidinium bromide  1 puff Inhalation Daily   Continuous Infusions: . sodium chloride 75 mL/hr at 05/23/20 1135   PRN Meds: acetaminophen, albuterol, nitroGLYCERIN, ondansetron (ZOFRAN) IV  Allergies:    Allergies  Allergen Reactions  . Penicillins Anaphylaxis    Has patient had a PCN reaction causing immediate rash, facial/tongue/throat swelling, SOB or lightheadedness with hypotension: Yes Has patient had a PCN reaction causing severe rash involving mucus membranes or skin necrosis: No Has patient had a PCN reaction that required hospitalization No Has patient had a PCN reaction occurring within the last 10 years: No If all of the above answers are "NO", then may proceed with Cephalosporin use.   . Levocetirizine Other (See Comments)    Muscle cramps    Social History:   Social History   Socioeconomic History  . Marital status: Single    Spouse name: Not on file  . Number of children: 6  . Years of education: Not on file  . Highest education level: Not on file  Occupational History  . Occupation: disabled  Tobacco Use  . Smoking status: Current Some Day Smoker    Packs/day: 0.00    Years: 36.00    Pack years: 0.00    Types: Cigarettes  . Smokeless tobacco: Never Used  . Tobacco comment: down to 5-6 cigs a day  Vaping Use  . Vaping Use: Never used  Substance and Sexual Activity   . Alcohol use: Not Currently    Alcohol/week: 14.0 - 21.0 standard drinks    Types: 14 - 21 Standard drinks or equivalent per week    Comment: 2-3 beers/day, with occasional "binges".  No history of withdrawal  . Drug use: Not Currently    Types: Marijuana, Cocaine    Comment: as of November has not used for a month  . Sexual activity: Not on file  Other Topics Concern  . Not on file  Social History Narrative  . Not on file   Social Determinants of Health   Financial Resource Strain:   . Difficulty of Paying Living Expenses:   Food Insecurity:   . Worried About Charity fundraiser in the Last Year:   . Arboriculturist in the Last Year:   Transportation Needs:   . Film/video editor (Medical):   Marland Kitchen Lack of Transportation (Non-Medical):   Physical Activity:   . Days of Exercise per Week:   . Minutes of Exercise per Session:   Stress:   . Feeling of Stress :   Social Connections:   . Frequency of Communication with Friends and Family:   . Frequency of Social Gatherings with Friends and Family:   . Attends Religious Services:   . Active Member of Clubs or Organizations:   . Attends Archivist Meetings:   Marland Kitchen Marital Status:   Intimate Partner Violence:   . Fear of Current or Ex-Partner:   . Emotionally Abused:   Marland Kitchen Physically Abused:   . Sexually Abused:      Family History:    Family History  Problem Relation Age of Onset  . Emphysema Mother   . Allergies Mother        "everyone in family"  . Asthma Mother        "everyone in family"  . Heart disease Mother   . Clotting disorder Mother   . Cancer Mother   . Emphysema Father   . Allergies Father   . Allergies Son   . Seizures Brother   . Diabetes Brother       Review of Systems    General:  No chills, fever, night sweats or weight changes.  Cardiovascular:  No palpitations, paroxysmal nocturnal dyspnea. Positive  for chest pain, dyspnea on exertion and edema.  Dermatological: No rash,  lesions/masses Respiratory: No cough, dyspnea Urologic: No hematuria, dysuria Abdominal:   No nausea, vomiting, diarrhea, bright red blood per rectum, melena, or hematemesis Neurologic:  No visual changes, wkns, changes in mental status. All other systems reviewed and are otherwise negative except as noted above.  Physical Exam/Data    Vitals:   05/23/20 1002 05/23/20 1032 05/23/20 1102 05/23/20 1132  BP: 121/85 117/78 113/83 (!) 120/90  Pulse: 68 64  76  Resp: 16 16 16 15   Temp:      TempSrc:      SpO2: 98% 100%  99%  Weight:      Height:       No intake or output data in the 24 hours ending 05/23/20 1219 Filed Weights   05/23/20 0426  Weight: 69.9 kg   Body mass index is 21.48 kg/m.   General: Pleasant male appearing in  NAD Psych: Normal affect. Neuro: Alert and oriented X 3. Moves all extremities spontaneously. HEENT: Normal  Neck: Supple without bruits or JVD. Lungs:  Resp regular and unlabored, rhonchi along upper lung [place]. No wheezing or rales. Heart: RRR no s3, s4, or murmurs. Abdomen: Soft, non-tender, non-distended, BS + x 4.  Extremities: No clubbing, cyanosis or lower extremity edema. DP/PT/Radials 2+ and equal bilaterally.   EKG:  The EKG was personally reviewed and demonstrates: NSR, HR 77 with atrial enlargement and LVH.    Labs/Studies     Relevant CV Studies:  Echocardiogram: 02/2020 Echocardiogram 03/16/2020:  Normal LV systolic function with visual EF 60-65%. Left ventricle cavity  is normal in size. Mild concentric hypertrophy of the left ventricle.  Normal global wall motion. Normal diastolic filling pattern, normal LAP.  Calculated EF 62%.  Trace mitral regurgitation. Mild calcification of the mitral valve  annulus.  Mild tricuspid regurgitation.  No significant change from prior study dated 04/16/2017.  Laboratory Data:  Chemistry Recent Labs  Lab 05/23/20 0431  NA 139  K 3.3*  CL 101  CO2 27  GLUCOSE 93  BUN 17    CREATININE 1.13  CALCIUM 8.9  GFRNONAA >60  GFRAA >60  ANIONGAP 11    No results for input(s): PROT, ALBUMIN, AST, ALT, ALKPHOS, BILITOT in the last 168 hours. Hematology Recent Labs  Lab 05/23/20 0431  WBC 15.4*  RBC 5.20  HGB 16.3  HCT 50.9  MCV 97.9  MCH 31.3  MCHC 32.0  RDW 15.3  PLT 290   Cardiac EnzymesNo results for input(s): TROPONINI in the last 168 hours. No results for input(s): TROPIPOC in the last 168 hours.  BNP Recent Labs  Lab 05/23/20 0431  BNP 41.0    DDimer No results for input(s): DDIMER in the last 168 hours.  Radiology/Studies:  DG Chest Portable 1 View  Result Date: 05/23/2020 CLINICAL DATA:  Chest pain EXAM: PORTABLE CHEST 1 VIEW COMPARISON:  February 07, 2020 FINDINGS: Lungs remain hyperexpanded. There is scarring throughout the lungs bilaterally, unchanged. There is no frank edema or airspace opacity. The heart size is normal. The pulmonary vascularity is stable with relative decreased vascularity in the upper lung regions, likely due to underlying emphysematous change. No adenopathy evident. Postoperative change noted in lower cervical region. IMPRESSION: Lungs hyperexpanded with apparent underlying emphysematous change. There are areas of scarring bilaterally. No edema or airspace opacity. Stable cardiac silhouette. Electronically Signed   By: Lowella Grip III M.D.   On: 05/23/2020 05:12  Assessment & Plan    1. Chest Pain with Atypical Features - He describes episodes of chest discomfort over the past 2 weeks which have occurred at rest and last for hours at a time. Symptoms have been relieved with the use of PPI therapy. - Initial HS Troponin 40 with repeat of 39. EKG shows NSR, HR 77 with atrial enlargement and LVH.  - Echocardiogram in 02/2020 showed a preserved EF as outlined below above. A repeat echocardiogram has been ordered by the admitting team this admission to reassess EF and wall motion. - He does have multiple cardiac risk  factors including HTN, prediabetes, tobacco use and possible PVD. Given his mildly elevated cardiac enzymes and symptoms, will plan to obtain a Lexiscan Myoview tomorrow morning for ischemic evaluation. Keep NPO after midnight.  If no significant ischemia, he can follow-up with his primary cardiologist as an outpatient.  2. HTN - BP has been variable at 112/83 - 155/101 while in the ED. Remains on Losartan 100mg  daily. HCTZ currently held due to hypokalemia. Can likely restart later today as he has received K+ supplementation.   3. PVD - Prior ABI's in 06/2019 indicated moderate left lower extremity arterial disease.  He is followed by vascular surgery and it was recommended he have repeat ABI's in 1 year. He remains on ASA 81 mg daily. Given LDL of 113 this admission, he would benefit from statin therapy. Would consider initiation of Crestor 10 mg daily.  4. COPD - On 2-3L [place] at baseline. Further management per the admitting team.   For questions or updates, please contact [place] Please consult [email or web address] for contact info under Cardiology/STEMI.  Signed, Erma Heritage, PA-C 05/23/2020, 12:19 PM Pager: [phone]   Attending note:  Patient seen and examined.  I reviewed the available records and discussed the case with Ms. Delano Metz, I agree with her above findings.  Patient presents to the ER describing intermittent leg swelling and also recent episodes of burning chest discomfort, at least one of which was related to consuming a spicy chicken sandwich.  He is on antireflux medications and has been taking Prilosec recently with improvement.  At baseline he reports shortness of breath in the setting of COPD, also has orthopedic limitations and uses a motorized wheelchair with prior history of back fracture.  He follows with [facility] for treatment of hypertension and his leg swelling, no obvious history of ischemic heart disease or cardiomyopathy.   Echocardiogram in May revealed LVEF 60 to 65% with no wall motion abnormalities or major valvular abnormalities. Per chart review, EDP discussed case with Dr. Acie Fredrickson in [place] recommending admission to the hospitalist team for nuclear medicine stress testing.  We were therefore consulted.  Patient is comfortable on examination, afebrile in the 60s to 70s in sinus rhythm by telemetry which I personally reviewed.  Systolic blood pressure ranging 110-130.  Lungs exhibit no active wheezing.  Cardiac exam reveals RRR without gallop.  No pitting edema distally.  Pertinent lab work includes potassium 3.3, creatinine 1.13, high-sensitivity troponin I 40 and 39, BNP 41, LDL 113, WBC 15.4, hemoglobin 16.3, TSH 1.48.  Chest x-ray reports apparent emphysematous changes with basilar scarring, no edema or infiltrates.  I personally reviewed his ECG which shows sinus rhythm with decreased R wave progression, left anterior fascicular block.  Patient presents with atypical chest discomfort, initial high-sensitivity troponin I levels are minimally increased and in flat pattern not suggestive of ACS.  He is being admitted to the hospitalist  team for observation, will schedule Lexiscan Myoview for tomorrow as per above discussion.  Satira Sark, M.D., F.A.C.C.

## 2020-05-23 NOTE — ED Triage Notes (Signed)
Patient complains of chest pain that started 2 weeks ago. Patient states pain became worse this a.m. Patient started having some swelling to his legs and feet that he noticed this a.m. Patient also complaining of shortness of breath.

## 2020-05-23 NOTE — Progress Notes (Signed)
  Echocardiogram 2D Echocardiogram has been performed.  Tony Sullivan 05/23/2020, 2:27 PM

## 2020-05-23 NOTE — ED Provider Notes (Addendum)
Patient CARE signed out to follow-up repeat troponin and admit to the hospital. Patient has a history of COPD on home oxygen presents with intermittent chest pain the past week.  Patient feels this gradually get more severe lasting 4 to 5 minutes at a time.  Intermittent radiation to the neck area.  Patient's chronic cigarette smoker.  Exam wise patient has minimal discomfort at this time, normal heart rate and blood pressure minimally elevated. Troponin initial elevated, repeat troponin around the same 40 range. Paged cardiology and paged hospitalist for observation/admission. Updated patient on plan of care. EKG reviewed similar to previous with anterior Q waves.  Spoke with Dr Tinnie Gens, plan for hospitalist to admit, pt will need stress test.  Pt can initially stay at [facility] or transfer to [facility] if further cardiac procedures/ eval required.   Golda Acre, MD 05/23/20 2162    Elnora Morrison, MD 05/23/20 [phone]

## 2020-05-23 NOTE — ED Provider Notes (Signed)
[facility] Provider Note   CSN: [id number] Arrival date & time: 05/23/20  3716   History Chief Complaint  Patient presents with  . Chest Pain    Tony Sullivan is a 59 y.o. male.  The history is provided by the patient.  Chest Pain He has history of hypertension, prediabetes, COPD and comes in with 1 week history of intermittent chest pain.  Pain is in the central chest and radiates into the neck.  He describes a burning pain which seems to be worse if he lays flat.  There are some associated dyspnea, diaphoresis, nausea but no vomiting.  Symptoms do not seem to be related to exertion.  Pain has been as severe as 10/10, but is currently 5/10.  He denies any nocturnal symptoms.  He has not tried anything to treat this pain.  When it comes on, it will generally last as long as 5 minutes.  He has also noted some swelling of his feet.  He does still smoke cigarettes.  There is no history of hyperlipidemia and no family history of premature coronary atherosclerosis.  Past Medical History:  Diagnosis Date  . Arthritis   . COPD (chronic obstructive pulmonary disease) ([place]) 2004   diagnosed in 2004, no PFT's to date.  Started on home O2 12/2013, after found to be desatting at PCP's office, and referred to pulmonology.  . Depression   . GERD (gastroesophageal reflux disease)   . High blood pressure   . Prediabetes   . Seasonal allergies   . Tobacco dependence     Patient Active Problem List   Diagnosis Date Noted  . HNP (herniated nucleus pulposus), cervical 09/06/2019  . Abdominal pain   . Poor dentition 11/04/2017  . Acute on chronic respiratory failure with hypoxia ([place]) 08/29/2017  . ETOH abuse 03/30/2017  . Cigarette smoker 03/30/2017  . Acute respiratory failure with hypoxia and hypercapnia ([place]) 03/29/2017  . Special screening for malignant neoplasms, colon   . Benign neoplasm of ascending colon   . Benign neoplasm of descending colon   . Hoarseness  08/22/2016  . GERD (gastroesophageal reflux disease) 08/22/2016  . Atypical chest pain 12/05/2015  . Essential hypertension 06/15/2014  . Pectoralis muscle strain 01/11/2014  . COPD with acute exacerbation ([place]) 01/10/2014  . COPD  GOLD III 01/08/2014  . Smoker 01/08/2014  . Cough 01/08/2014  . Chronic respiratory failure with hypoxia ([place]) 01/08/2014    Past Surgical History:  Procedure Laterality Date  . ANTERIOR CERVICAL DECOMP/DISCECTOMY FUSION N/A 09/06/2019   Procedure: Anterior Cervical Discecectomy Fusion - Cerivcal Five-Cervical Six;  Surgeon: Kary Kos, MD;  Location: [facility];  Service: Neurosurgery;  Laterality: N/A;  Anterior Cervical Discecectomy Fusion - Cerivcal Five-Cervical Six  . ANTERIOR CERVICAL DISCECTOMY  09/06/2019  . APPENDECTOMY  1980  . COLONOSCOPY WITH PROPOFOL N/A 12/10/2016   Procedure: COLONOSCOPY WITH PROPOFOL;  Surgeon: [name], MD;  Location: [facility];  Service: Endoscopy;  Laterality: N/A;  . HIP SURGERY Right   . SHOULDER ARTHROSCOPY Right        Family History  Problem Relation Age of Onset  . Emphysema Mother   . Allergies Mother        "everyone in family"  . Asthma Mother        "everyone in family"  . Heart disease Mother   . Clotting disorder Mother   . Cancer Mother   . Emphysema Father   . Allergies Father   . Allergies  Son   . Seizures Brother   . Diabetes Brother     Social History   Tobacco Use  . Smoking status: Current Some Day Smoker    Packs/day: 0.00    Years: 36.00    Pack years: 0.00    Types: Cigarettes  . Smokeless tobacco: Never Used  . Tobacco comment: down to 5-6 cigs a day  Vaping Use  . Vaping Use: Never used  Substance Use Topics  . Alcohol use: Not Currently    Alcohol/week: 14.0 - 21.0 standard drinks    Types: 14 - 21 Standard drinks or equivalent per week    Comment: 2-3 beers/day, with occasional "binges".  No history of withdrawal  . Drug use: Not Currently    Types: Marijuana,  Cocaine    Comment: as of November has not used for a month    Home Medications Prior to Admission medications   Medication Sig Start Date End Date Taking? Authorizing Provider  albuterol (PROAIR HFA) 108 (90 Base) MCG/ACT inhaler Inhale 2 puffs into the lungs every 4 (four) hours as needed. INHALE TWO PUFFS UP TO EVERY 4 HOURS IF NEEDED FOR SHORT OF BREATH 05/11/20   [name], MD  albuterol (PROVENTIL) (2.5 MG/3ML) 0.083% nebulizer solution Take 3 mLs (2.5 mg total) by nebulization every 4 (four) hours as needed for wheezing or shortness of breath. 04/28/18   [name], NP  benztropine (COGENTIN) 0.5 MG tablet Take 0.5 mg by mouth at bedtime.     [provider]  budesonide-formoterol (SYMBICORT) 160-4.5 MCG/ACT inhaler INHALE TWO PUFFS BY MOUTH INTO THE LUNGS TWICE A DAY 05/10/20   [name], MD  cetirizine (ZYRTEC) 10 MG tablet Take 10 mg by mouth daily.    [provider]  famotidine (PEPCID) 20 MG tablet TAKE ONE TABLET BY MOUTH DAILY AT BEDTIME 03/08/20   [name], MD  fluticasone (FLONASE) 50 MCG/ACT nasal spray Place 2 sprays into both nostrils 3 (three) times daily as needed for allergies.     [provider]  hydrochlorothiazide (MICROZIDE) 12.5 MG capsule TAKE ONE CAPSULE BY MOUTH EVERY MORNING 05/16/20   Tolia, Sunit, DO  latanoprost (XALATAN) 0.005 % ophthalmic solution Place 1 drop into both eyes at bedtime. 05/24/19   [provider]  losartan (COZAAR) 100 MG tablet Take 100 mg by mouth daily.    [provider]  loxapine (LOXITANE) 10 MG capsule Take 10 mg by mouth at bedtime.     [provider]  mirtazapine (REMERON) 30 MG tablet Take 30 mg by mouth at bedtime.    [provider]  Multiple Vitamin (MULTIVITAMIN) capsule Take 1 capsule by mouth daily.    [provider]  OXYGEN Inhale 2 L into the lungs as directed. 2-3 liters with sleep and exertion as needed for low oxygen    [provider]  pantoprazole (PROTONIX) 40 MG tablet Take 1 tablet (40 mg total) by mouth daily. Take 30-60 min before first meal of the day 08/13/19   [name], MD  polyethylene glycol (MIRALAX / GLYCOLAX) 17 g packet TAKE 17 GRAMS BY MOUTH DAILY 03/09/20   [name], MD  predniSONE (DELTASONE) 10 MG tablet Take 1 tablet (10 mg total) by mouth daily with breakfast. 05/03/20   [name], NP  Respiratory Therapy Supplies (FLUTTER) DEVI Use as directed 12/21/18   [name], MD  risperiDONE (RISPERDAL) 2 MG tablet Take 2 mg by  mouth at bedtime.    [provider]  Tiotropium Bromide Monohydrate (SPIRIVA RESPIMAT) 2.5 MCG/ACT AERS INHALE TWO PUFFS BY MOUTH EVERY MORNING 05/10/20   [name], MD    Allergies    Penicillins, Sulfa antibiotics, and Levocetirizine  Review of Systems   Review of Systems  Cardiovascular: Positive for chest pain.  All other systems reviewed and are negative.   Physical Exam Updated Vital Signs BP (!) 153/105   Pulse 77   Temp 97.9 F (36.6 C) (Oral)   Resp (!) 25   Ht 5\' 11"  (1.803 m)   Wt 69.9 kg   SpO2 100%   BMI 21.48 kg/m   Physical Exam Vitals and nursing note reviewed.   59 year old male, resting comfortably and in no acute distress. Vital signs are significant for mildly elevated blood pressure. Oxygen saturation is 100%, which is normal. Head is normocephalic and atraumatic. PERRLA, EOMI. Oropharynx is clear. Neck is nontender and supple without adenopathy or JVD. Back is nontender and there is no CVA tenderness. Lungs have diminished airflow throughout with some faint expiratory wheezes.  There are no rales or rhonchi. Chest is nontender. Heart has regular rate and rhythm without murmur. Abdomen is soft, flat, nontender without masses or hepatosplenomegaly and peristalsis is normoactive. Extremities have 2+ pedal edema, full range of motion is present. Skin is warm and dry without rash. Neurologic: Mental  status is normal, cranial nerves are intact, there are no motor or sensory deficits.  ED Results / Procedures / Treatments   Labs (all labs ordered are listed, but only abnormal results are displayed) Labs Reviewed  CBC - Abnormal; Notable for the following components:      Result Value   WBC 15.4 (*)    All other components within normal limits  BASIC METABOLIC PANEL  BRAIN NATRIURETIC PEPTIDE  TROPONIN I (HIGH SENSITIVITY)    EKG EKG Interpretation  Date/Time:  Tuesday May 23 2020 04:24:49 EDT Ventricular Rate:  77 PR Interval:    QRS Duration: 85 QT Interval:  390 QTC Calculation: 442 R Axis:   -69 Text Interpretation: Sinus rhythm Right atrial enlargement Left anterior fascicular block Anteroseptal infarct, age indeterminate When compared with ECG of 02/07/2020, No significant change was found Confirmed by Delora Fuel ([id number]) on 05/23/2020 4:33:05 AM   Radiology DG Chest Portable 1 View  Result Date: 05/23/2020 CLINICAL DATA:  Chest pain EXAM: PORTABLE CHEST 1 VIEW COMPARISON:  February 07, 2020 FINDINGS: Lungs remain hyperexpanded. There is scarring throughout the lungs bilaterally, unchanged. There is no frank edema or airspace opacity. The heart size is normal. The pulmonary vascularity is stable with relative decreased vascularity in the upper lung regions, likely due to underlying emphysematous change. No adenopathy evident. Postoperative change noted in lower cervical region. IMPRESSION: Lungs hyperexpanded with apparent underlying emphysematous change. There are areas of scarring bilaterally. No edema or airspace opacity. Stable cardiac silhouette. Electronically Signed   By: Lowella Grip III M.D.   On: 05/23/2020 05:12    Procedures Procedures  Medications Ordered in ED Medications - No data to display  ED Course  I have reviewed the triage vital signs and the nursing notes.  Pertinent labs & imaging results that were available during my care of the patient  were reviewed by me and considered in my medical decision making (see chart for details).  MDM Rules/Calculators/A&P Chest pain of uncertain cause.  He does have significant risk factors for coronary artery disease.  Heart pathway  patient risk score is 4 which puts him at elevated risk for major adverse cardiac events in the next 6 weeks.  Chest x-ray shows no acute process.  Troponin is pending.  We will also treat with aspirin and nitroglycerin.  Old records are reviewed, and he had a normal echocardiogram 2 months ago, prior ED evaluation of chest pain in 2016.  Troponin has come back mildly elevated at 40.  Potassium is slightly low at 3.3.  He did get pain relief with nitroglycerin.  Will give oral potassium and nitroglycerin ointment topically.  Will need to wait for delta troponin.  Case is signed out to Dr. Reather Converse.  Final Clinical Impression(s) / ED Diagnoses Final diagnoses:  Chest discomfort  Elevated troponin  Hypokalemia    Rx / DC Orders ED Discharge Orders    None       Delora Fuel, MD 50/51/83 [phone]

## 2020-05-23 NOTE — H&P (Addendum)
History and Physical    Tony Sullivan CWU:[id number] DOB: 11-29-1960 DOA: 05/23/2020  PCP: [name], FNP   Patient coming from: Home  I have personally briefly reviewed patient's old medical records in [place]  Chief Complaint: Chest pain  HPI: Tony Sullivan is a 59 y.o. male with medical history significant of chronic respiratory failure with hypoxia, tobacco abuse, COPD Gold III, bipolar disorder, gastroesophageal reflux disease, hypertension, hyperlipidemia and seasonal allergies; who presented to the hospital secondary to chest pain.  Patient reports symptom has been present for the last 4 to 5 days and failing to go away.  This is the first time that he had these kind of symptoms, and expressed that he feels different than prior episodes of reflux.  Pain is localized in the middle of his chest, radiated to the base of his neck, lasting 5 to 10 minutes at a time when present, 7-8 out of 10 in intensity, nothing appears to make it better; patient reports discomfort is dull and tightness sensation in nature; pain is present at rest and also with activity, last 1 adding slightly worsening in his breathing while doing it (dyspnea on exertion).  Patient reported no fever, no chills, no nausea, no vomiting, no abdominal pain, no dysuria, no hematuria, no melena, no hematochezia, no focal weakness, no blurred visions, sick contacts or any other complaints.  ED Course: Mild elevation of his troponin appreciated, chronic leukocytosis, chest x-ray without acute cardiopulmonary process, EKG without acute ischemic changes.  Patient responded to analgesics and nitroglycerin.  Case discussed with cardiology service who has recommended admission for nuclear stress test.  TRH has been contacted to place in observation for further evaluation and management.  Review of Systems: As per HPI otherwise all other systems reviewed and are negative.  Past Medical History:  Diagnosis Date  .  Arthritis   . COPD (chronic obstructive pulmonary disease) ([place]) 2004   diagnosed in 2004, no PFT's to date.  Started on home O2 12/2013, after found to be desatting at PCP's office, and referred to pulmonology.  . Depression   . GERD (gastroesophageal reflux disease)   . High blood pressure   . Prediabetes   . Seasonal allergies   . Tobacco dependence     Past Surgical History:  Procedure Laterality Date  . ANTERIOR CERVICAL DECOMP/DISCECTOMY FUSION N/A 09/06/2019   Procedure: Anterior Cervical Discecectomy Fusion - Cerivcal Five-Cervical Six;  Surgeon: Kary Kos, MD;  Location: [facility];  Service: Neurosurgery;  Laterality: N/A;  Anterior Cervical Discecectomy Fusion - Cerivcal Five-Cervical Six  . ANTERIOR CERVICAL DISCECTOMY  09/06/2019  . APPENDECTOMY  1980  . COLONOSCOPY WITH PROPOFOL N/A 12/10/2016   Procedure: COLONOSCOPY WITH PROPOFOL;  Surgeon: [name], MD;  Location: [facility];  Service: Endoscopy;  Laterality: N/A;  . HIP SURGERY Right   . SHOULDER ARTHROSCOPY Right     Social History  reports that he has been smoking cigarettes. He has been smoking about 0.00 packs per day for the past 36.00 years. He has never used smokeless tobacco. He reports previous alcohol use of about 14.0 - 21.0 standard drinks of alcohol per week. He reports previous drug use. Drugs: Marijuana and Cocaine.  Allergies  Allergen Reactions  . Penicillins Anaphylaxis    Has patient had a PCN reaction causing immediate rash, facial/tongue/throat swelling, SOB or lightheadedness with hypotension: Yes Has patient had a PCN reaction causing severe rash involving mucus membranes or skin necrosis: No Has patient  had a PCN reaction that required hospitalization No Has patient had a PCN reaction occurring within the last 10 years: No If all of the above answers are "NO", then may proceed with Cephalosporin use.   . Sulfa Antibiotics Swelling    Swelling of hands and throat   . Levocetirizine Other  (See Comments)    Muscle cramps    Family History  Problem Relation Age of Onset  . Emphysema Mother   . Allergies Mother        "everyone in family"  . Asthma Mother        "everyone in family"  . Heart disease Mother   . Clotting disorder Mother   . Cancer Mother   . Emphysema Father   . Allergies Father   . Allergies Son   . Seizures Brother   . Diabetes Brother     Prior to Admission medications   Medication Sig Start Date End Date Taking? Authorizing Provider  albuterol (PROAIR HFA) 108 (90 Base) MCG/ACT inhaler Inhale 2 puffs into the lungs every 4 (four) hours as needed. INHALE TWO PUFFS UP TO EVERY 4 HOURS IF NEEDED FOR SHORT OF BREATH 05/11/20   [name], MD  albuterol (PROVENTIL) (2.5 MG/3ML) 0.083% nebulizer solution Take 3 mLs (2.5 mg total) by nebulization every 4 (four) hours as needed for wheezing or shortness of breath. 04/28/18   [name], NP  benztropine (COGENTIN) 0.5 MG tablet Take 0.5 mg by mouth at bedtime.     [provider]  budesonide-formoterol (SYMBICORT) 160-4.5 MCG/ACT inhaler INHALE TWO PUFFS BY MOUTH INTO THE LUNGS TWICE A DAY 05/10/20   [name], MD  cetirizine (ZYRTEC) 10 MG tablet Take 10 mg by mouth daily.    [provider]  famotidine (PEPCID) 20 MG tablet TAKE ONE TABLET BY MOUTH DAILY AT BEDTIME 03/08/20   [name], MD  fluticasone (FLONASE) 50 MCG/ACT nasal spray Place 2 sprays into both nostrils 3 (three) times daily as needed for allergies.     [provider]  hydrochlorothiazide (MICROZIDE) 12.5 MG capsule TAKE ONE CAPSULE BY MOUTH EVERY MORNING 05/16/20   Tolia, Sunit, DO  latanoprost (XALATAN) 0.005 % ophthalmic solution Place 1 drop into both eyes at bedtime. 05/24/19   [provider]  losartan (COZAAR) 100 MG tablet Take 100 mg by mouth daily.    [provider]  loxapine (LOXITANE) 10 MG capsule Take 10 mg by mouth at bedtime.     [provider]    mirtazapine (REMERON) 30 MG tablet Take 30 mg by mouth at bedtime.    [provider]  Multiple Vitamin (MULTIVITAMIN) capsule Take 1 capsule by mouth daily.    [provider]  OXYGEN Inhale 2 L into the lungs as directed. 2-3 liters with sleep and exertion as needed for low oxygen    [provider]  pantoprazole (PROTONIX) 40 MG tablet Take 1 tablet (40 mg total) by mouth daily. Take 30-60 min before first meal of the day 08/13/19   [name], MD  polyethylene glycol (MIRALAX / GLYCOLAX) 17 g packet TAKE 17 GRAMS BY MOUTH DAILY 03/09/20   [name], MD  predniSONE (DELTASONE) 10 MG tablet Take 1 tablet (10 mg total) by mouth daily with breakfast. 05/03/20   [name], NP  Respiratory Therapy Supplies (FLUTTER) DEVI Use as directed 12/21/18   [name], MD  risperiDONE (RISPERDAL) 2 MG tablet Take 2 mg by  mouth at bedtime.    [provider]  Tiotropium Bromide Monohydrate (SPIRIVA RESPIMAT) 2.5 MCG/ACT AERS INHALE TWO PUFFS BY MOUTH EVERY MORNING 05/10/20   [name], MD    Physical Exam: Vitals:   05/23/20 0431 05/23/20 0501 05/23/20 0559 05/23/20 0729  BP: (!) 155/101 (!) 131/95 112/83 (!) 120/95  Pulse: 77 69 67 70  Resp: 19 21 14 15   Temp:      TempSrc:      SpO2: 100% 100% 97% 100%  Weight:      Height:        Constitutional: At time of my evaluation reports no chest pain, no nausea, no vomiting, stable breathing. Vitals:   05/23/20 0431 05/23/20 0501 05/23/20 0559 05/23/20 0729  BP: (!) 155/101 (!) 131/95 112/83 (!) 120/95  Pulse: 77 69 67 70  Resp: 19 21 14 15   Temp:      TempSrc:      SpO2: 100% 100% 97% 100%  Weight:      Height:       Eyes: PERRL, lids and conjunctivae normal, no icterus, no nystagmus. ENMT: Mucous membranes are moist. Posterior pharynx clear of any exudate or lesions. Neck: normal, supple, no masses, no thyromegaly. Respiratory: Good air movement bilaterally; currently no wheezing, no  crackles, no using accessory muscle.  Positive scattered rhonchi are appreciated. Cardiovascular: Regular rate and rhythm, no murmurs / rubs / gallops. No JVD appreciated on exam. Abdomen: no tenderness, no masses palpated. No hepatosplenomegaly. Bowel sounds positive.  Musculoskeletal: no clubbing / cyanosis. No joint deformity upper and lower extremities.  No contractures; positive pedal edema appreciated bilaterally. Skin: no rashes, no petechiae. Neurologic: CN 2-12 grossly intact.  No focal neurologic deficit.  Patient with decreased muscular strength from previous accident and ongoing deconditioning; uses normally motorized wheelchair for long distances able to transfer, stand and walk short distances by himself. Psychiatric: Normal judgment and insight. Alert and oriented x 3. Normal mood.    Labs on Admission: I have personally reviewed following labs and imaging studies  CBC: Recent Labs  Lab 05/23/20 0431  WBC 15.4*  HGB 16.3  HCT 50.9  MCV 97.9  PLT 481    Basic Metabolic Panel: Recent Labs  Lab 05/23/20 0431  NA 139  K 3.3*  CL 101  CO2 27  GLUCOSE 93  BUN 17  CREATININE 1.13  CALCIUM 8.9    GFR: Estimated Creatinine Clearance: 72.2 mL/min (by C-G formula based on SCr of 1.13 mg/dL).  Liver Function Tests: No results for input(s): AST, ALT, ALKPHOS, BILITOT, PROT, ALBUMIN in the last 168 hours.  Urine analysis:    Component Value Date/Time   COLORURINE YELLOW 08/06/2019 [place] 08/06/2019 1333   LABSPEC 1.045 (H) 08/06/2019 1333   PHURINE 6.0 08/06/2019 1333   GLUCOSEU NEGATIVE 08/06/2019 [place] 08/06/2019 [place] 08/06/2019 [place] 08/06/2019 1333   PROTEINUR NEGATIVE 08/06/2019 1333   UROBILINOGEN 1.0 06/12/2015 1021   NITRITE NEGATIVE 08/06/2019 1333   LEUKOCYTESUR NEGATIVE 08/06/2019 1333    Radiological Exams on Admission: DG Chest Portable 1 View  Result Date:  05/23/2020 CLINICAL DATA:  Chest pain EXAM: PORTABLE CHEST 1 VIEW COMPARISON:  February 07, 2020 FINDINGS: Lungs remain hyperexpanded. There is scarring throughout the lungs bilaterally, unchanged. There is no frank edema or airspace opacity. The heart size is normal. The pulmonary vascularity is stable with relative decreased vascularity in the upper lung  regions, likely due to underlying emphysematous change. No adenopathy evident. Postoperative change noted in lower cervical region. IMPRESSION: Lungs hyperexpanded with apparent underlying emphysematous change. There are areas of scarring bilaterally. No edema or airspace opacity. Stable cardiac silhouette. Electronically Signed   By: Lowella Grip III M.D.   On: 05/23/2020 05:12    EKG: Independently reviewed.  No acute ischemic changes appreciated.  Sinus rhythm.  Assessment/Plan 1-Chest pain -Heart score 4 -Risk factors including age, gender, hypertension, hyperlipidemia, tobacco abuse. -No prior cardiac evaluation or stratification. -Mildly elevated troponin (flat); no acute ischemic changes appreciated on EKG. -Patient symptoms improved with the use of nitroglycerin. -will check TSH, UDS and 2-D echo. -Case has been discussed with cardiology service who has recommended inpatient nuclear stress test for stratification. -Patient has been started on Zocor and aspirin; no beta-blockers due to underlying history of COPD. -PPI twice daily and as needed GI cocktail ordered. -Follow clinical response and further recommendation by cardiology service.  2-chronic respiratory failure secondary to COPD  GOLD III -No wheezing and reporting arrest breathing is a stable. -Continue home bronchodilator regimen -Continue oxygen supplementation.  3-Smoker -Extensive cessation counseling has been provided -Nicotine patch has been ordered.  4-Essential hypertension -Continue losartan -Heart healthy diet has been encouraged.  5-GERD (gastroesophageal  reflux disease) -Continue PPI.  6-Bipolar disorder (HCC) -No suicidal ideation or hallucinations currently -Continue the use of Remeron, loxapine, Risperdal and benztropine  7-history of increased intraocular pressure -Continue the use of latanoprost eyedrops  8-chronic leukocytosis -appears to be in the setting of tobacco abuse. -No acute infection appreciated.  9-mild hypokalemia -will replete and follow electrolytes trend. -will check Mg level -most likely associated to chronic use of HCTZ.  DVT prophylaxis: Lovenox.  Code Status:   Full code Family Communication:  No family at bedside.  Disposition Plan:   Patient is from:  Home  Anticipated DC to:  Home  Anticipated DC date:  05/24/20  Anticipated DC barriers:    Complete ACS rule out. Consults called:  Cardiology service (Dr. Katharina Caper) Admission status:  Observation, telemetry, LOS < 2 midnights.   Severity of Illness: Mild severity on presentation; patient with chest pain and a heart score of 4.  No prior history of cardiac evaluation.  Atypical discomfort with typical features.  Gender, tobacco abuse, age, hypertension and hyperlipidemia as risk factors.  After discussing with cardiology service recommendations given for inpatient nuclear stress test.    Barton Dubois MD [facility]  How to contact the [facility] Attending or Consulting provider [place] or covering provider during after hours [place], for this patient?   1. Check the care team in [facility] and look for a) attending/consulting [facility] provider listed and b) the [facility] team listed 2. Log into [email or web address] and use [place]'s universal password to access. If you do not have the password, please contact the hospital operator. 3. Locate the [facility] provider you are looking for under [facility] and page to a number that you can be directly reached. 4. If you still have difficulty reaching the provider, please page the [facility] (Director on Call) for the Hospitalists  listed on amion for assistance.  05/23/2020, 8:42 AM

## 2020-05-24 ENCOUNTER — Encounter (HOSPITAL_COMMUNITY): Payer: Self-pay | Admitting: Internal Medicine

## 2020-05-24 ENCOUNTER — Observation Stay (HOSPITAL_BASED_OUTPATIENT_CLINIC_OR_DEPARTMENT_OTHER): Payer: Medicaid Other

## 2020-05-24 DIAGNOSIS — I10 Essential (primary) hypertension: Secondary | ICD-10-CM | POA: Diagnosis not present

## 2020-05-24 DIAGNOSIS — E876 Hypokalemia: Secondary | ICD-10-CM

## 2020-05-24 DIAGNOSIS — J449 Chronic obstructive pulmonary disease, unspecified: Secondary | ICD-10-CM | POA: Diagnosis not present

## 2020-05-24 DIAGNOSIS — E782 Mixed hyperlipidemia: Secondary | ICD-10-CM

## 2020-05-24 DIAGNOSIS — R0789 Other chest pain: Secondary | ICD-10-CM | POA: Diagnosis not present

## 2020-05-24 DIAGNOSIS — R079 Chest pain, unspecified: Secondary | ICD-10-CM | POA: Diagnosis not present

## 2020-05-24 DIAGNOSIS — E785 Hyperlipidemia, unspecified: Secondary | ICD-10-CM

## 2020-05-24 LAB — BASIC METABOLIC PANEL
Anion gap: 7 (ref 5–15)
BUN: 13 mg/dL (ref 6–20)
CO2: 26 mmol/L (ref 22–32)
Calcium: 8.4 mg/dL — ABNORMAL LOW (ref 8.9–10.3)
Chloride: 105 mmol/L (ref 98–111)
Creatinine, Ser: 0.82 mg/dL (ref 0.61–1.24)
GFR calc Af Amer: >60 mL/min (ref >60)
GFR calc non Af Amer: >60 mL/min (ref >60)
Glucose, Bld: 89 mg/dL (ref 70–99)
Potassium: 3.6 mmol/L (ref 3.5–5.1)
Sodium: 138 mmol/L (ref 135–145)

## 2020-05-24 LAB — NM MYOCAR MULTI W/SPECT W/WALL MOTION / EF
LV dias vol: 106 mL (ref 62–150)
LV sys vol: 42 mL
Peak HR: 100 {beats}/min
RATE: 0.3
Rest HR: 61 {beats}/min
SDS: 0
SRS: 4
SSS: 4
TID: 0.99

## 2020-05-24 MED ORDER — ATORVASTATIN CALCIUM 40 MG PO TABS: 40.0000 mg | ORAL_TABLET | Freq: Every day | ORAL | 0 refills | Status: DC

## 2020-05-24 MED ORDER — REGADENOSON 0.4 MG/5ML IV SOLN
INTRAVENOUS | Status: AC
  Administered 2020-05-24: 0.4 mg via INTRAVENOUS
  Filled 2020-05-24: qty 5

## 2020-05-24 MED ORDER — TECHNETIUM TC 99M TETROFOSMIN IV KIT
30.0000 | PACK | Freq: Once | INTRAVENOUS | Status: AC | PRN
  Administered 2020-05-24: 32 via INTRAVENOUS

## 2020-05-24 MED ORDER — ROSUVASTATIN CALCIUM 10 MG PO TABS: 10.0000 mg | ORAL_TABLET | Freq: Every day | ORAL | Status: DC

## 2020-05-24 MED ORDER — SODIUM CHLORIDE FLUSH 0.9 % IV SOLN
INTRAVENOUS | Status: AC
  Administered 2020-05-24: 10 mL via INTRAVENOUS
  Filled 2020-05-24: qty 10

## 2020-05-24 MED ORDER — TECHNETIUM TC 99M TETROFOSMIN IV KIT
10.0000 | PACK | Freq: Once | INTRAVENOUS | Status: AC | PRN
  Administered 2020-05-24: 10.42 via INTRAVENOUS

## 2020-05-24 NOTE — Plan of Care (Signed)

## 2020-05-24 NOTE — Discharge Summary (Signed)
Physician Discharge Summary  Tony Sullivan PZW:[id number] DOB: 18-Jan-1961 DOA: 05/23/2020  PCP: [name], FNP  Admit date: 05/23/2020 Discharge date: 05/24/2020  Admitted From: Home Disposition: Home  Recommendations for Outpatient Follow-up:  1. Follow up with PCP in 1-2 weeks 2. Please obtain BMP/CBC in one week 3. Please follow up with your PCP on the following pending results: Unresulted Labs (From admission, onward) Comment          Start     Ordered   05/30/20 0500  Creatinine, serum  (enoxaparin (LOVENOX)    CrCl >/= 30 ml/min)  Weekly,   R     Comments: while on enoxaparin therapy    05/23/20 0824           Home Health: None Equipment/Devices: None  Discharge Condition: Stable CODE STATUS: Full code Diet recommendation: Cardiac/low-fat  Subjective: Seen and examined.  No complaints.  HPI: Tony Sullivan is a 59 y.o. male with medical history significant of chronic respiratory failure with hypoxia, tobacco abuse, COPD Gold III, bipolar disorder, gastroesophageal reflux disease, hypertension, hyperlipidemia and seasonal allergies; who presented to the hospital secondary to chest pain.  Patient reports symptom has been present for the last 4 to 5 days and failing to go away.  This is the first time that he had these kind of symptoms, and expressed that he feels different than prior episodes of reflux.  Pain is localized in the middle of his chest, radiated to the base of his neck, lasting 5 to 10 minutes at a time when present, 7-8 out of 10 in intensity, nothing appears to make it better; patient reports discomfort is dull and tightness sensation in nature; pain is present at rest and also with activity, last 1 adding slightly worsening in his breathing while doing it (dyspnea on exertion).  Patient reported no fever, no chills, no nausea, no vomiting, no abdominal pain, no dysuria, no hematuria, no melena, no hematochezia, no focal weakness, no blurred visions, sick  contacts or any other complaints.  ED Course: Mild elevation of his troponin appreciated, chronic leukocytosis, chest x-ray without acute cardiopulmonary process, EKG without acute ischemic changes.  Patient responded to analgesics and nitroglycerin.  Case discussed with cardiology service who has recommended admission for nuclear stress test.  TRH has been contacted to place in observation for further evaluation and management.  Brief/Interim Summary: Patient was admitted with atypical chest pain.  Per history, his chest pain actually started after he ate spicy chicken.  After admission, he had another episode of chest pain last night at around 10 PM which resolved after he was given Protonix.  Troponin was slightly elevated and not consistent with ACS.  EKG did not have any acute ST-T wave changes.  He underwent Myoview which was low risk study.  His pain was likely due to GERD.  Cardiology cleared him.  He is being discharged in stable condition.  He was also diagnosed with having hyperlipidemia, elevated cholesterol and LDL.  He is being discharged on atorvastatin 40 mg.  Discharge Diagnoses:  Principal Problem:   Chest pain Active Problems:   COPD  GOLD III   Smoker   Chronic respiratory failure with hypoxia (HCC)   Essential hypertension   GERD (gastroesophageal reflux disease)   Bipolar disorder (HCC)   Hyperlipidemia   Hypokalemia    Discharge Instructions  Discharge Instructions    Discharge patient   Complete by: As directed    Discharge disposition: 01-Home or Self Care  Discharge patient date: 05/24/2020     Allergies as of 05/24/2020      Reactions   Penicillins Anaphylaxis   Has patient had a PCN reaction causing immediate rash, facial/tongue/throat swelling, SOB or lightheadedness with hypotension: Yes Has patient had a PCN reaction causing severe rash involving mucus membranes or skin necrosis: No Has patient had a PCN reaction that required hospitalization No Has  patient had a PCN reaction occurring within the last 10 years: No If all of the above answers are "NO", then may proceed with Cephalosporin use.   Levocetirizine Other (See Comments)   Muscle cramps      Medication List    TAKE these medications   albuterol (2.5 MG/3ML) 0.083% nebulizer solution Commonly known as: PROVENTIL Take 3 mLs (2.5 mg total) by nebulization every 4 (four) hours as needed for wheezing or shortness of breath. What changed: Another medication with the same name was changed. Make sure you understand how and when to take each.   albuterol 108 (90 Base) MCG/ACT inhaler Commonly known as: ProAir HFA Inhale 2 puffs into the lungs every 4 (four) hours as needed. INHALE TWO PUFFS UP TO EVERY 4 HOURS IF NEEDED FOR SHORT OF BREATH What changed: additional instructions   atorvastatin 40 MG tablet Commonly known as: Lipitor Take 1 tablet (40 mg total) by mouth daily.   benztropine 0.5 MG tablet Commonly known as: COGENTIN Take 0.5 mg by mouth at bedtime.   budesonide-formoterol 160-4.5 MCG/ACT inhaler Commonly known as: Symbicort INHALE TWO PUFFS BY MOUTH INTO THE LUNGS TWICE A DAY What changed:   how much to take  how to take this  when to take this   cetirizine 10 MG tablet Commonly known as: ZYRTEC Take 10 mg by mouth daily.   famotidine 20 MG tablet Commonly known as: PEPCID TAKE ONE TABLET BY MOUTH DAILY AT BEDTIME   fluticasone 50 MCG/ACT nasal spray Commonly known as: FLONASE Place 2 sprays into both nostrils 3 (three) times daily as needed for allergies.   hydrochlorothiazide 12.5 MG capsule Commonly known as: MICROZIDE TAKE ONE CAPSULE BY MOUTH EVERY MORNING What changed: when to take this   latanoprost 0.005 % ophthalmic solution Commonly known as: XALATAN Place 1 drop into both eyes at bedtime.   losartan 100 MG tablet Commonly known as: COZAAR Take 100 mg by mouth daily.   loxapine 10 MG capsule Commonly known as: LOXITANE Take  10 mg by mouth at bedtime.   mirtazapine 30 MG tablet Commonly known as: REMERON Take 30 mg by mouth at bedtime.   multivitamin capsule Take 1 capsule by mouth daily.   OXYGEN Inhale 2 L into the lungs as directed. 2-4 liters with sleep and exertion as needed for low oxygen   polyethylene glycol 17 g packet Commonly known as: MIRALAX / GLYCOLAX TAKE 17 GRAMS BY MOUTH DAILY What changed: See the new instructions.   predniSONE 10 MG tablet Commonly known as: DELTASONE Take 1 tablet (10 mg total) by mouth daily with breakfast. What changed:   how much to take  additional instructions   risperiDONE 2 MG tablet Commonly known as: RISPERDAL Take 2 mg by mouth at bedtime.   Spiriva Respimat 2.5 MCG/ACT Aers Generic drug: Tiotropium Bromide Monohydrate INHALE TWO PUFFS BY MOUTH EVERY MORNING What changed:   how much to take  how to take this  when to take this  additional instructions       Follow-up Information    Tolia, Sunit, DO.  Schedule an appointment as soon as possible for a visit in 4 week(s).   Specialties: Cardiology, Radiology, Vascular Surgery Contact information: [place] [address] [phone]        [name], FNP Follow up in 1 day(s).   Specialty: Nurse Practitioner Contact information: [address] [place] [address] [phone]              Allergies  Allergen Reactions  . Penicillins Anaphylaxis    Has patient had a PCN reaction causing immediate rash, facial/tongue/throat swelling, SOB or lightheadedness with hypotension: Yes Has patient had a PCN reaction causing severe rash involving mucus membranes or skin necrosis: No Has patient had a PCN reaction that required hospitalization No Has patient had a PCN reaction occurring within the last 10 years: No If all of the above answers are "NO", then may proceed with Cephalosporin use.   . Levocetirizine Other (See  Comments)    Muscle cramps    Consultations: Cardiology   Procedures/Studies: NM Myocar Multi W/Spect W/Wall Motion / EF  Result Date: 05/24/2020  No diagnostic ST segment changes to indicate ischemia.  Small, mild intensity, predominantly apical inferior defect that is fixed and most consistent with soft tissue attenuation. No large ischemic territories.  This is a low risk study.  Nuclear stress EF: 61%.    DG Chest Portable 1 View  Result Date: 05/23/2020 CLINICAL DATA:  Chest pain EXAM: PORTABLE CHEST 1 VIEW COMPARISON:  February 07, 2020 FINDINGS: Lungs remain hyperexpanded. There is scarring throughout the lungs bilaterally, unchanged. There is no frank edema or airspace opacity. The heart size is normal. The pulmonary vascularity is stable with relative decreased vascularity in the upper lung regions, likely due to underlying emphysematous change. No adenopathy evident. Postoperative change noted in lower cervical region. IMPRESSION: Lungs hyperexpanded with apparent underlying emphysematous change. There are areas of scarring bilaterally. No edema or airspace opacity. Stable cardiac silhouette. Electronically Signed   By: Lowella Grip III M.D.   On: 05/23/2020 05:12   ECHOCARDIOGRAM COMPLETE  Result Date: 05/23/2020    ECHOCARDIOGRAM REPORT   Patient Name:   MARICO BUCKLE Enlow Date of Exam: 05/23/2020 Medical Rec #:  [id number]      Height:       71.0 in Accession #:    [id number]     Weight:       154.0 lb Date of Birth:  1961/01/01       BSA:          1.887 m Patient Age:    31 years       BP:           129/87 mmHg Patient Gender: M              HR:           68 bpm. Exam Location:  [facility] Procedure: 2D Echo, Cardiac Doppler and Color Doppler Indications:    Chest pain  History:        Patient has prior history of Echocardiogram examinations, most                 recent 03/16/2020. COPD and PVD, Signs/Symptoms:Chest Pain and LE                 edma; Risk Factors:Hypertension, Current  Smoker and Diabetes.  Sonographer:    Dustin Flock RDCS Referring Phys: [place]  1. Left ventricular ejection  fraction, by estimation, is 50%. The left ventricle has low normal function. The left ventricle has no regional wall motion abnormalities. There is mild left ventricular hypertrophy. Left ventricular diastolic parameters are  consistent with Grade I diastolic dysfunction (impaired relaxation).  2. Right ventricular systolic function is normal. The right ventricular size is normal. Tricuspid regurgitation signal is inadequate for assessing PA pressure.  3. The mitral valve is grossly normal. Trivial mitral valve regurgitation.  4. The aortic valve is tricuspid. Aortic valve regurgitation is not visualized.  5. The inferior vena cava is normal in size with greater than 50% respiratory variability, suggesting right atrial pressure of 3 mmHg. FINDINGS  Left Ventricle: Left ventricular ejection fraction, by estimation, is 50%. The left ventricle has low normal function. The left ventricle has no regional wall motion abnormalities. The left ventricular internal cavity size was normal in size. There is mild left ventricular hypertrophy. Left ventricular diastolic parameters are consistent with Grade I diastolic dysfunction (impaired relaxation). Right Ventricle: The right ventricular size is normal. No increase in right ventricular wall thickness. Right ventricular systolic function is normal. Tricuspid regurgitation signal is inadequate for assessing PA pressure. Left Atrium: Left atrial size was normal in size. Right Atrium: Right atrial size was normal in size. Pericardium: There is no evidence of pericardial effusion. Presence of pericardial fat pad. Mitral Valve: The mitral valve is grossly normal. Trivial mitral valve regurgitation. Tricuspid Valve: The tricuspid valve is grossly normal. Tricuspid valve regurgitation is trivial. Aortic Valve: The aortic valve is tricuspid. Aortic  valve regurgitation is not visualized. Mild aortic valve annular calcification. Pulmonic Valve: The pulmonic valve was grossly normal. Pulmonic valve regurgitation is trivial. Aorta: The aortic root is normal in size and structure. Venous: The inferior vena cava is normal in size with greater than 50% respiratory variability, suggesting right atrial pressure of 3 mmHg. IAS/Shunts: No atrial level shunt detected by color flow Doppler.  LEFT VENTRICLE PLAX 2D LVIDd:         4.91 cm  Diastology LVIDs:         3.96 cm  LV e' lateral:   7.51 cm/s LV PW:         1.12 cm  LV E/e' lateral: 10.8 LV IVS:        1.06 cm  LV e' medial:    5.11 cm/s LVOT diam:     2.20 cm  LV E/e' medial:  15.9 LV SV:         68 LV SV Index:   36 LVOT Area:     3.80 cm  RIGHT VENTRICLE RV Basal diam:  3.99 cm RV S prime:     11.90 cm/s TAPSE (M-mode): 3.0 cm LEFT ATRIUM             Index       RIGHT ATRIUM           Index LA diam:        3.20 cm 1.70 cm/m  RA Area:     17.70 cm LA Vol (A2C):   39.7 ml 21.04 ml/m RA Volume:   51.30 ml  27.19 ml/m LA Vol (A4C):   35.8 ml 18.97 ml/m LA Biplane Vol: 38.6 ml 20.46 ml/m  AORTIC VALVE LVOT Vmax:   97.90 cm/s LVOT Vmean:  61.100 cm/s LVOT VTI:    0.180 m  AORTA Ao Root diam: 3.40 cm MITRAL VALVE MV Area (PHT): 3.23 cm    SHUNTS MV Decel Time: 235 msec  Systemic VTI:  0.18 m MV E velocity: 81.40 cm/s  Systemic Diam: 2.20 cm MV A velocity: 99.00 cm/s MV E/A ratio:  0.82 Rozann Lesches MD Electronically signed by Rozann Lesches MD Signature Date/Time: 05/23/2020/4:17:52 PM    Final       Discharge Exam: Vitals:   05/24/20 0512 05/24/20 0803  BP: (!) 133/91   Pulse: 62   Resp: 18   Temp: 97.7 F (36.5 C)   SpO2: 100% 98%   Vitals:   05/23/20 2147 05/24/20 0052 05/24/20 0512 05/24/20 0803  BP: 126/84 (!) 137/90 (!) 133/91   Pulse: 71 64 62   Resp: 16 18 18    Temp: (!) 97.5 F (36.4 C) 97.9 F (36.6 C) 97.7 F (36.5 C)   TempSrc:      SpO2: 100% 100% 100% 98%  Weight:       Height:        General: Pt is alert, awake, not in acute distress Cardiovascular: RRR, S1/S2 +, no rubs, no gallops Respiratory: CTA bilaterally, no wheezing, no rhonchi Abdominal: Soft, NT, ND, bowel sounds + Extremities: no edema, no cyanosis    The results of significant diagnostics from this hospitalization (including imaging, microbiology, ancillary and laboratory) are listed below for reference.     Microbiology: Recent Results (from the past 240 hour(s))  SARS Coronavirus 2 by RT PCR (hospital order, performed in [name] hospital lab) Nasopharyngeal Nasopharyngeal Swab     Status: None   Collection Time: 05/23/20 11:15 AM   Specimen: Nasopharyngeal Swab  Result Value Ref Range Status   SARS Coronavirus 2 NEGATIVE NEGATIVE Final    Comment: (NOTE) SARS-CoV-2 target nucleic acids are NOT DETECTED.  The SARS-CoV-2 RNA is generally detectable in upper and lower respiratory specimens during the acute phase of infection. The lowest concentration of SARS-CoV-2 viral copies this assay can detect is 250 copies / mL. A negative result does not preclude SARS-CoV-2 infection and should not be used as the sole basis for treatment or other patient management decisions.  A negative result may occur with improper specimen collection / handling, submission of specimen other than nasopharyngeal swab, presence of viral mutation(s) within the areas targeted by this assay, and inadequate number of viral copies (<250 copies / mL). A negative result must be combined with clinical observations, patient history, and epidemiological information.  Fact Sheet for Patients:   StrictlyIdeas.no  Fact Sheet for Healthcare Providers: BankingDealers.co.za  This test is not yet approved or  cleared by the [place] FDA and has been authorized for detection and/or diagnosis of SARS-CoV-2 by FDA under an Emergency Use Authorization (EUA).  This  EUA will remain in effect (meaning this test can be used) for the duration of the COVID-19 declaration under Section 564(b)(1) of the Act, 21 U.S.C. section 360bbb-3(b)(1), unless the authorization is terminated or revoked sooner.  Performed at [facility], [address], [place], [place] [address]      Labs: BNP (last 3 results) Recent Labs    02/06/20 2001 05/23/20 0431  BNP 20.4 76.2   Basic Metabolic Panel: Recent Labs  Lab 05/23/20 0431 05/23/20 0938 05/24/20 0821  NA 139  --  138  K 3.3*  --  3.6  CL 101  --  105  CO2 27  --  26  GLUCOSE 93  --  89  BUN 17  --  13  CREATININE 1.13  --  0.82  CALCIUM 8.9  --  8.4*  MG  --  2.4  --    Liver Function Tests: No results for input(s): AST, ALT, ALKPHOS, BILITOT, PROT, ALBUMIN in the last 168 hours. No results for input(s): LIPASE, AMYLASE in the last 168 hours. No results for input(s): AMMONIA in the last 168 hours. CBC: Recent Labs  Lab 05/23/20 0431  WBC 15.4*  HGB 16.3  HCT 50.9  MCV 97.9  PLT 290   Cardiac Enzymes: No results for input(s): CKTOTAL, CKMB, CKMBINDEX, TROPONINI in the last 168 hours. BNP: Invalid input(s): POCBNP CBG: No results for input(s): GLUCAP in the last 168 hours. D-Dimer No results for input(s): DDIMER in the last 72 hours. Hgb A1c No results for input(s): HGBA1C in the last 72 hours. Lipid Profile Recent Labs    05/23/20 0653  CHOL 206*  HDL 64  LDLCALC 113*  TRIG 144  CHOLHDL 3.2   Thyroid function studies Recent Labs    05/23/20 0938  TSH 1.484   Anemia work up No results for input(s): VITAMINB12, FOLATE, FERRITIN, TIBC, IRON, RETICCTPCT in the last 72 hours. Urinalysis    Component Value Date/Time   COLORURINE YELLOW 08/06/2019 1333   APPEARANCEUR CLEAR 08/06/2019 1333   LABSPEC 1.045 (H) 08/06/2019 1333   PHURINE 6.0 08/06/2019 1333   GLUCOSEU NEGATIVE 08/06/2019 1333   HGBUR NEGATIVE 08/06/2019 [place] 08/06/2019 1333    KETONESUR NEGATIVE 08/06/2019 1333   PROTEINUR NEGATIVE 08/06/2019 1333   UROBILINOGEN 1.0 06/12/2015 1021   NITRITE NEGATIVE 08/06/2019 1333   LEUKOCYTESUR NEGATIVE 08/06/2019 1333   Sepsis Labs Invalid input(s): PROCALCITONIN,  WBC,  LACTICIDVEN Microbiology Recent Results (from the past 240 hour(s))  SARS Coronavirus 2 by RT PCR (hospital order, performed in [place] hospital lab) Nasopharyngeal Nasopharyngeal Swab     Status: None   Collection Time: 05/23/20 11:15 AM   Specimen: Nasopharyngeal Swab  Result Value Ref Range Status   SARS Coronavirus 2 NEGATIVE NEGATIVE Final    Comment: (NOTE) SARS-CoV-2 target nucleic acids are NOT DETECTED.  The SARS-CoV-2 RNA is generally detectable in upper and lower respiratory specimens during the acute phase of infection. The lowest concentration of SARS-CoV-2 viral copies this assay can detect is 250 copies / mL. A negative result does not preclude SARS-CoV-2 infection and should not be used as the sole basis for treatment or other patient management decisions.  A negative result may occur with improper specimen collection / handling, submission of specimen other than nasopharyngeal swab, presence of viral mutation(s) within the areas targeted by this assay, and inadequate number of viral copies (<250 copies / mL). A negative result must be combined with clinical observations, patient history, and epidemiological information.  Fact Sheet for Patients:   StrictlyIdeas.no  Fact Sheet for Healthcare Providers: BankingDealers.co.za  This test is not yet approved or  cleared by the [place] FDA and has been authorized for detection and/or diagnosis of SARS-CoV-2 by FDA under an Emergency Use Authorization (EUA).  This EUA will remain in effect (meaning this test can be used) for the duration of the COVID-19 declaration under Section 564(b)(1) of the Act, 21 U.S.C. section  360bbb-3(b)(1), unless the authorization is terminated or revoked sooner.  Performed at [facility], [address], [place], [place] [address]      Time coordinating discharge: Over 30 minutes  SIGNED:   Darliss Cheney, MD  [facility] 05/24/2020, 12:07 PM  If 7PM-7AM, please contact night-coverage [email or web address]

## 2020-05-24 NOTE — Progress Notes (Addendum)
Progress Note  Patient Name: [name] Date of Encounter: 05/24/2020  Primary Cardiologist: Dr. Terri Skains [facility]  Subjective   He reports an episode of chest pain last night around 2000 which resolved with Protonix. Says this felt more like his typical reflux and different from his presenting symptoms.   Breathing has been at baseline. No recurrent lower extremity edema.   Inpatient Medications    Scheduled Meds:  aspirin EC  81 mg Oral Daily   benztropine  0.5 mg Oral QHS   enoxaparin (LOVENOX) injection  40 mg Subcutaneous Q24H   latanoprost  1 drop Both Eyes QHS   losartan  100 mg Oral Daily   loxapine  10 mg Oral QHS   mirtazapine  30 mg Oral QHS   mometasone-formoterol  2 puff Inhalation BID   nicotine  14 mg Transdermal Daily   pantoprazole  40 mg Oral BID   predniSONE  10 mg Oral Q breakfast   risperiDONE  2 mg Oral QHS   rosuvastatin  10 mg Oral q1800   umeclidinium bromide  1 puff Inhalation Daily   Continuous Infusions:  PRN Meds: acetaminophen, albuterol, nitroGLYCERIN, ondansetron (ZOFRAN) IV   Vital Signs    Vitals:   05/23/20 2147 05/24/20 0052 05/24/20 0512 05/24/20 0803  BP: 126/84 (!) 137/90 (!) 133/91   Pulse: 71 64 62   Resp: 16 18 18    Temp: (!) 97.5 F (36.4 C) 97.9 F (36.6 C) 97.7 F (36.5 C)   TempSrc:      SpO2: 100% 100% 100% 98%  Weight:      Height:        Intake/Output Summary (Last 24 hours) at 05/24/2020 0825 Last data filed at 05/24/2020 0512 Gross per 24 hour  Intake 215.73 ml  Output 800 ml  Net -584.27 ml    Last 3 Weights 05/23/2020 05/03/2020 04/21/2020  Weight (lbs) 154 lb 154 lb 6.4 oz 152 lb  Weight (kg) 69.854 kg 70.035 kg 68.947 kg      Telemetry    NSR, HR in 60's to 70's. No significant arrhythmias.  - Personally Reviewed  ECG    NSR, HR 77 with atrial enlargement and LAFB.  - Personally Reviewed  Physical Exam   General: Well developed, well nourished, male appearing  in no acute distress. Head: Normocephalic, atraumatic.  Neck: Supple without bruits, JVD not elevated. Lungs:  Resp regular and unlabored, rhonchi throughout. No wheezing or rales. On supplemental oxygen.  Heart: RRR, S1, S2, no S3, S4, or murmur; no rub. Abdomen: Soft, non-tender, non-distended with normoactive bowel sounds. No hepatomegaly. No rebound/guarding. No obvious abdominal masses. Extremities: No clubbing, cyanosis, or lower extremity edema. Distal pedal pulses are 2+ bilaterally. Neuro: Alert and oriented X 3. Moves all extremities spontaneously. Psych: Normal affect.  Labs    Chemistry Recent Labs  Lab 05/23/20 0431  NA 139  K 3.3*  CL 101  CO2 27  GLUCOSE 93  BUN 17  CREATININE 1.13  CALCIUM 8.9  GFRNONAA >60  GFRAA >60  ANIONGAP 11     Hematology Recent Labs  Lab 05/23/20 0431  WBC 15.4*  RBC 5.20  HGB 16.3  HCT 50.9  MCV 97.9  MCH 31.3  MCHC 32.0  RDW 15.3  PLT 290    Cardiac EnzymesNo results for input(s): TROPONINI in the last 168 hours. No results for input(s): TROPIPOC in the last 168 hours.   BNP Recent Labs  Lab 05/23/20 0431  BNP  41.0     DDimer No results for input(s): DDIMER in the last 168 hours.   Radiology    DG Chest Portable 1 View  Result Date: 05/23/2020 CLINICAL DATA:  Chest pain EXAM: PORTABLE CHEST 1 VIEW COMPARISON:  February 07, 2020 FINDINGS: Lungs remain hyperexpanded. There is scarring throughout the lungs bilaterally, unchanged. There is no frank edema or airspace opacity. The heart size is normal. The pulmonary vascularity is stable with relative decreased vascularity in the upper lung regions, likely due to underlying emphysematous change. No adenopathy evident. Postoperative change noted in lower cervical region. IMPRESSION: Lungs hyperexpanded with apparent underlying emphysematous change. There are areas of scarring bilaterally. No edema or airspace opacity. Stable cardiac silhouette. Electronically Signed   By:  Lowella Grip III M.D.   On: 05/23/2020 05:12   Cardiac Studies   Echocardiogram: 05/23/2020 IMPRESSIONS    1. Left ventricular ejection fraction, by estimation, is 50%. The left  ventricle has low normal function. The left ventricle has no regional wall  motion abnormalities. There is mild left ventricular hypertrophy. Left  ventricular diastolic parameters are  consistent with Grade I diastolic dysfunction (impaired relaxation).  2. Right ventricular systolic function is normal. The right ventricular  size is normal. Tricuspid regurgitation signal is inadequate for assessing  PA pressure.  3. The mitral valve is grossly normal. Trivial mitral valve  regurgitation.  4. The aortic valve is tricuspid. Aortic valve regurgitation is not  visualized.  5. The inferior vena cava is normal in size with greater than 50%  respiratory variability, suggesting right atrial pressure of 3 mmHg.   Patient Profile     59 y.o. male w/ PMH of COPD (on 2-3L [place]), HTN, PVD, tobacco use and prediabetes who presented to [name] ED on 05/23/2020 for evaluation of chest pain.   Assessment & Plan    1. Chest Pain with Atypical Features - He presented with episodes of chest discomfort which would last for hours at a time and improve with PPI therapy. Uses a motorized wheelchair at baseline so difficult to assess exertional status.  - Initial and repeat HS Troponin values have been flat at 40 and 39. EKG without acute ischemic changes. Echocardiogram shows his EF is low-normal at 50% with no regional WMA. He does have mild LVH and Grade 1 DD - Given his cardiac risk factors and slightly elevated enzymes, a Lexiscan Myoview has been recommended for ischemic evaluation. He has been NPO since midnight. If no significant ischemia, continue with risk factor modification.   2. HTN - BP has been stable at 111/84 - 149/85 since admission. Continue Losartan 100mg  daily. Would anticipate restarting HCTZ at  the time of discharge with close follow-up BMET given his hypokalemia on admission.   3. PVD - He had moderate left lower extremity arterial disease by prior ABI's in 2020. Followed by Vascular Surgery. Remains on ASA 81mg  daily. Not on statin therapy prior to admission. Given LDL at 113, will start Crestor 10mg  daily. Will need repeat FLP and LFT's in 6-8 weeks.   4. COPD - Oxygen saturations have been stable on 3L [place] (on 2-3L [place] at baseline). Management per admitting team.   5. Hypokalemia - K+ 3.3 on admission. Will recheck BMET today.    For questions or updates, please contact [place] Please consult [email or web address] for contact info under Cardiology/STEMI.   Arna Medici , PA-C 8:25 AM 05/24/2020 Pager: [phone]   Attending note:  Patient seen in the  stress test lab. I agree with above assessment by Ms. Strader PA-C. Mr. Scalise does report an episode of chest discomfort last evening more consistent with reflux, better with Protonix.  Follow-up echocardiogram from yesterday revealed LVEF approximately 50% with mild LVH and mild diastolic dysfunction.  Continue aspirin, losartan, adding Crestor due to PAD history. We will follow up on Lexiscan Myoview results this afternoon.  Satira Sark, M.D., F.A.C.C.

## 2020-05-24 NOTE — Discharge Instructions (Signed)

## 2020-05-25 ENCOUNTER — Ambulatory Visit: Payer: Medicaid Other | Admitting: Physical Therapy

## 2020-05-29 ENCOUNTER — Other Ambulatory Visit: Payer: Self-pay

## 2020-05-29 ENCOUNTER — Ambulatory Visit (HOSPITAL_COMMUNITY): Payer: Medicaid Other | Admitting: Physical Therapy

## 2020-05-29 ENCOUNTER — Telehealth (HOSPITAL_COMMUNITY): Payer: Self-pay | Admitting: Physical Therapy

## 2020-05-29 ENCOUNTER — Telehealth: Payer: Self-pay | Admitting: Internal Medicine

## 2020-05-29 ENCOUNTER — Emergency Department (HOSPITAL_COMMUNITY): Payer: Medicaid Other

## 2020-05-29 DIAGNOSIS — F17210 Nicotine dependence, cigarettes, uncomplicated: Secondary | ICD-10-CM | POA: Insufficient documentation

## 2020-05-29 DIAGNOSIS — I10 Essential (primary) hypertension: Secondary | ICD-10-CM | POA: Insufficient documentation

## 2020-05-29 DIAGNOSIS — R0602 Shortness of breath: Secondary | ICD-10-CM | POA: Diagnosis not present

## 2020-05-29 DIAGNOSIS — R519 Headache, unspecified: Secondary | ICD-10-CM | POA: Insufficient documentation

## 2020-05-29 DIAGNOSIS — Z79899 Other long term (current) drug therapy: Secondary | ICD-10-CM | POA: Diagnosis not present

## 2020-05-29 DIAGNOSIS — M542 Cervicalgia: Secondary | ICD-10-CM | POA: Insufficient documentation

## 2020-05-29 DIAGNOSIS — Z7952 Long term (current) use of systemic steroids: Secondary | ICD-10-CM | POA: Diagnosis not present

## 2020-05-29 DIAGNOSIS — J449 Chronic obstructive pulmonary disease, unspecified: Secondary | ICD-10-CM | POA: Insufficient documentation

## 2020-05-29 DIAGNOSIS — Z7951 Long term (current) use of inhaled steroids: Secondary | ICD-10-CM | POA: Insufficient documentation

## 2020-05-29 DIAGNOSIS — K219 Gastro-esophageal reflux disease without esophagitis: Secondary | ICD-10-CM | POA: Diagnosis not present

## 2020-05-29 DIAGNOSIS — R0789 Other chest pain: Secondary | ICD-10-CM | POA: Insufficient documentation

## 2020-05-29 IMAGING — DX DG CHEST 1V
1 series · 1 of 1 positions shown · non-contrast
Comparison: [DATE], CT [DATE]

CLINICAL DATA: Chest pain

EXAM:
CHEST  1 VIEW

[chest ap]
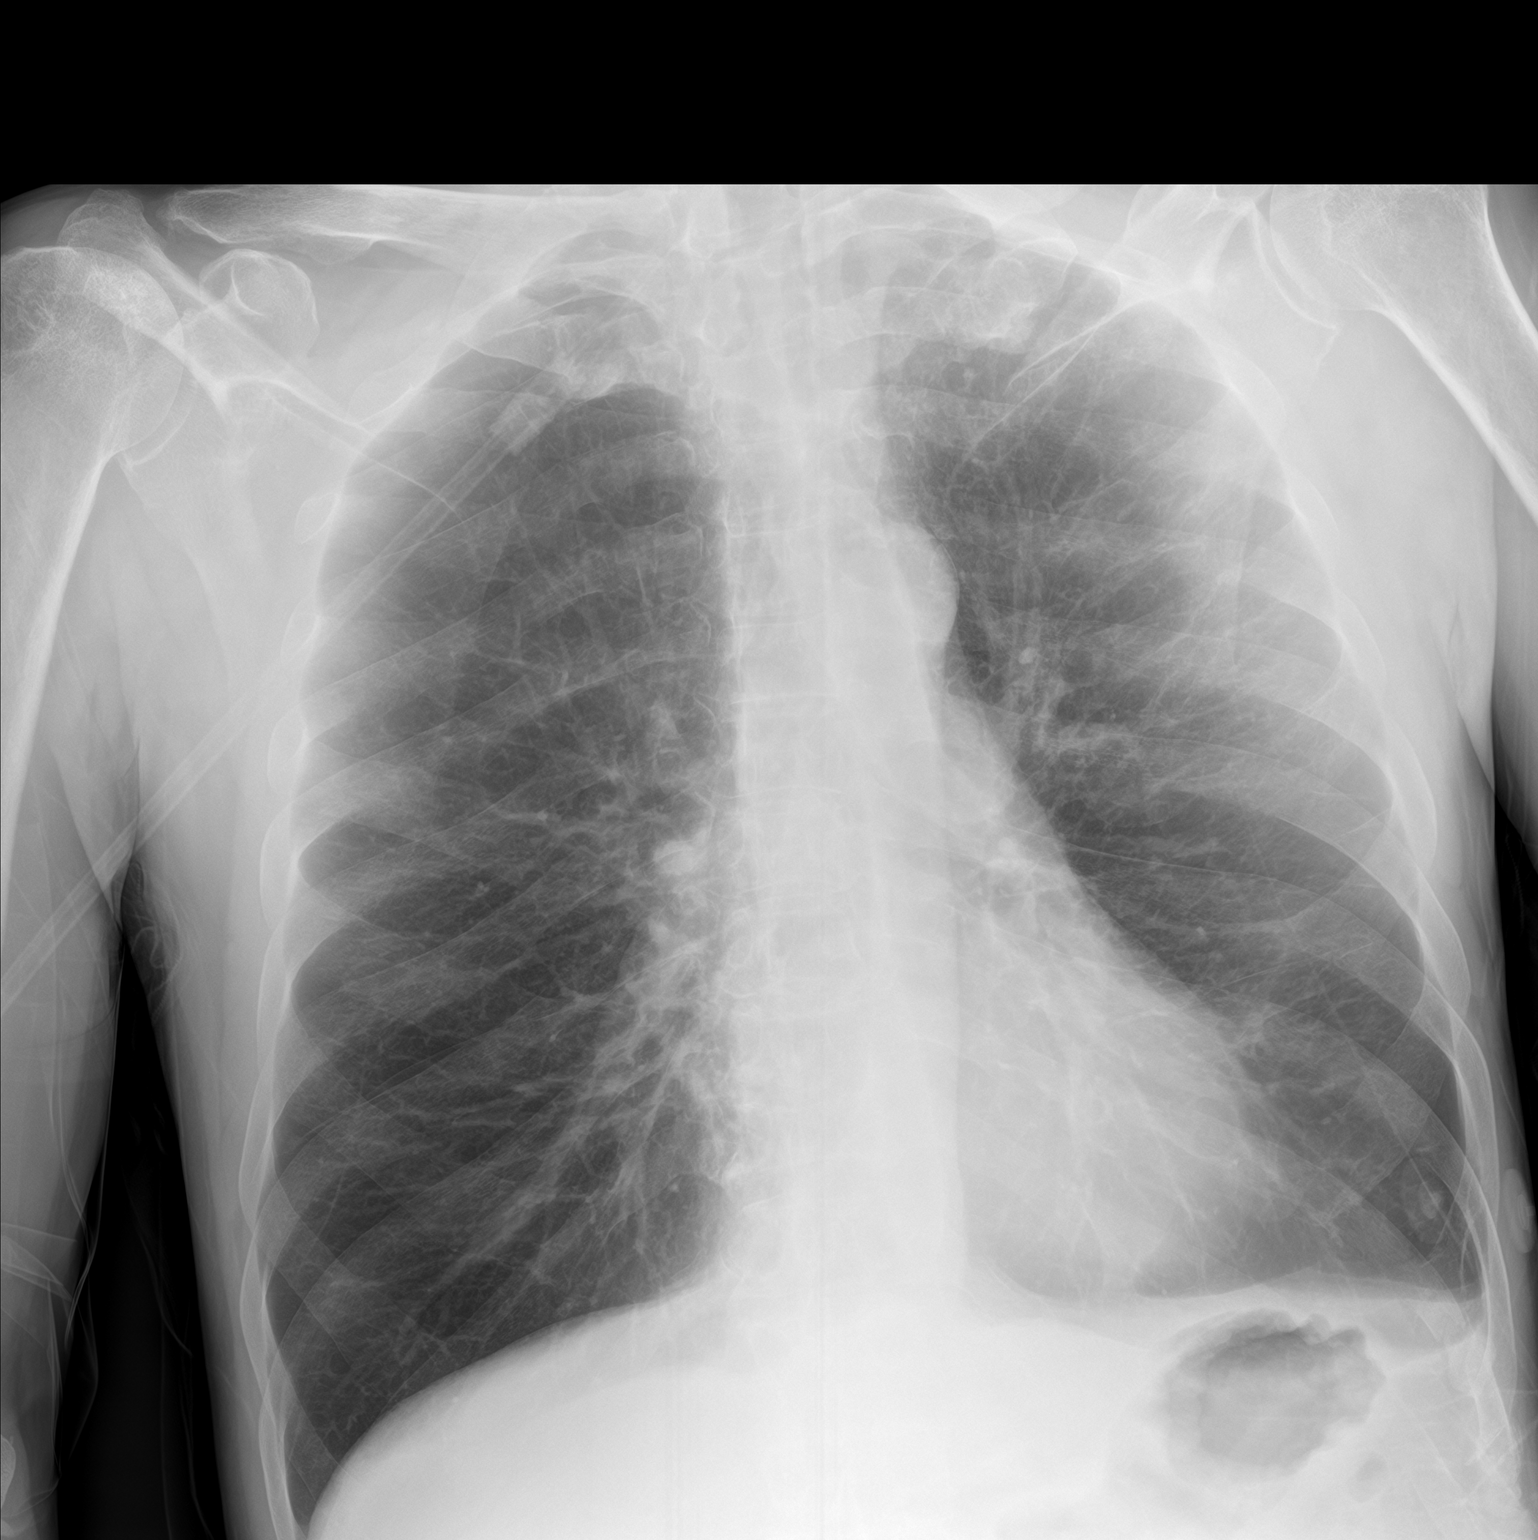

[1 of 1 positions shown; findings below may reference images not displayed]

FINDINGS: Hyperinflation with emphysematous disease and chronic bronchitic
changes. No consolidation or effusion. Normal heart size. No
pneumothorax. Surgical hardware in the cervical spine.
IMPRESSION: No active cardiopulmonary disease. Hyperinflation with emphysematous
disease and chronic bronchitic changes.

## 2020-05-29 MED ORDER — AZITHROMYCIN 250 MG PO TABS
250.0000 mg | ORAL_TABLET | Freq: Every day | ORAL | 0 refills | Status: DC
Start: 2020-05-29 — End: 2020-09-02

## 2020-05-29 NOTE — Telephone Encounter (Signed)
Ok for zpak but also remember prednisone is 10 mg x 2 daily until better then work toward tapering to one on even and one half on odd as per [employer]

## 2020-05-29 NOTE — Telephone Encounter (Signed)
Patient called with plan of care from Dr. [name], prescription for Z-pack sent as requested to preferred pharmacy.

## 2020-05-29 NOTE — Telephone Encounter (Signed)
pt call to cancel both appts due to his feet are swelling up

## 2020-05-29 NOTE — Telephone Encounter (Signed)
Spoke with patient, states he has had some tightness/inflammation in his chest for a couple of days.  He was in the hospital last week, states everything turned out fine except his cholesterol was high.  States the heat is bothering him and he has been coughing up clear phlem.  He is using his nebulizer treatments as prescribed.  Using oxygen 2.5-3.5 liters.  States his oxygen level drops to 86% in the morning when he is transiting from lying to sitting comes back up after 3-4 minutes, at times has to increase his oxygen to 4-4.5 L.  Patient is requesting a z-pack.  Dr. [name], please advise.

## 2020-05-29 NOTE — ED Triage Notes (Signed)
Pt here after being recently seen for chest pain. States it ws dx as acid reflux but states hes not feeling any better 3l [place] at home

## 2020-05-30 ENCOUNTER — Emergency Department (HOSPITAL_COMMUNITY)
Admission: EM | Admit: 2020-05-30 | Discharge: 2020-05-30 | Disposition: A | Discharge status: Home / Self Care | Payer: Medicaid Other | Attending: Emergency Medicine | Admitting: Emergency Medicine

## 2020-05-30 ENCOUNTER — Encounter: Payer: Medicaid Other | Admitting: Physical Therapy

## 2020-05-30 DIAGNOSIS — R0789 Other chest pain: Secondary | ICD-10-CM

## 2020-05-30 DIAGNOSIS — R079 Chest pain, unspecified: Secondary | ICD-10-CM

## 2020-05-30 DIAGNOSIS — K219 Gastro-esophageal reflux disease without esophagitis: Secondary | ICD-10-CM

## 2020-05-30 LAB — CBC
HCT: 51.9 % (ref 39.0–52.0)
Hemoglobin: 16.2 g/dL (ref 13.0–17.0)
MCH: 31.5 pg (ref 26.0–34.0)
MCHC: 31.2 g/dL (ref 30.0–36.0)
MCV: 101 fL — ABNORMAL HIGH (ref 80.0–100.0)
Platelets: 251 10*3/uL (ref 150–400)
RBC: 5.14 MIL/uL (ref 4.22–5.81)
RDW: 15.7 % — ABNORMAL HIGH (ref 11.5–15.5)
WBC: 15.3 10*3/uL — ABNORMAL HIGH (ref 4.0–10.5)
nRBC: 0 % (ref 0.0–0.2)

## 2020-05-30 LAB — TROPONIN I (HIGH SENSITIVITY)
Troponin I (High Sensitivity): 19 ng/L — ABNORMAL HIGH (ref <18)
Troponin I (High Sensitivity): 15 ng/L (ref <18)

## 2020-05-30 LAB — BASIC METABOLIC PANEL
Anion gap: 12 (ref 5–15)
BUN: 29 mg/dL — ABNORMAL HIGH (ref 6–20)
CO2: 25 mmol/L (ref 22–32)
Calcium: 8.7 mg/dL — ABNORMAL LOW (ref 8.9–10.3)
Chloride: 103 mmol/L (ref 98–111)
Creatinine, Ser: 1.5 mg/dL — ABNORMAL HIGH (ref 0.61–1.24)
GFR calc Af Amer: 59 mL/min — ABNORMAL LOW (ref >60)
GFR calc non Af Amer: 51 mL/min — ABNORMAL LOW (ref >60)
Glucose, Bld: 86 mg/dL (ref 70–99)
Potassium: 3.4 mmol/L — ABNORMAL LOW (ref 3.5–5.1)
Sodium: 140 mmol/L (ref 135–145)

## 2020-05-30 LAB — BRAIN NATRIURETIC PEPTIDE: B Natriuretic Peptide: 258 pg/mL — ABNORMAL HIGH (ref 0.0–100.0)

## 2020-05-30 LAB — GLUCOSE, CAPILLARY
Glucose-Capillary: 212 mg/dL — ABNORMAL HIGH (ref 70–99)
Glucose-Capillary: 212 mg/dL — ABNORMAL HIGH (ref 70–99)

## 2020-05-30 MED ORDER — PANTOPRAZOLE SODIUM 20 MG PO TBEC
20.0000 mg | DELAYED_RELEASE_TABLET | Freq: Every day | ORAL | 0 refills | Status: DC
Start: 2020-05-30 — End: 2020-09-02

## 2020-05-30 MED ORDER — PANTOPRAZOLE SODIUM 40 MG PO TBEC
40.0000 mg | DELAYED_RELEASE_TABLET | Freq: Once | ORAL | Status: AC
  Administered 2020-05-30: 40 mg via ORAL
  Filled 2020-05-30: qty 1

## 2020-05-30 MED ORDER — PANTOPRAZOLE SODIUM 40 MG IV SOLR
40.0000 mg | Freq: Once | INTRAVENOUS | Status: DC
  Filled 2020-05-30: qty 40

## 2020-05-30 NOTE — ED Provider Notes (Addendum)
[facility] EMERGENCY DEPARTMENT Provider Note   CSN: [id number] Arrival date & time: 05/29/20  2115     History Chief Complaint  Patient presents with  . Chest Pain    Tony Sullivan is a 59 y.o. male with a history of COPD on chronic home O2 at 3 L, chronic respiratory failure with hypoxia, GERD, hypertension who was discharged from here on July 27 where he was admitted for chest pain.  During that visit he had mild elevation in his troponins but not consistent with ACS, no EKG changes and had a Myoview which was a low risk study.  Additionally he had a echocardiogram reporting an ejection fraction of 61% it was felt his symptoms were GERD related and he was discharged home with Pepcid, but denies improvement in his symptoms.  He describes episodes of pain which starts as a headache, migrates into his neck and localizes into his mid chest region which he describes as a heavy aching pain but with a burning component and then seems to migrate to his right chest region.  He has 7-8 episodes of this per day lasting for up to 15 to 20 minutes.  He also endorses increased shortness of breath.  He is not very mobile at baseline, but reports moving from his electric scooter to the toilet for example makes him very short of breath, he has documented oxygen is down to 86% with this slight exertion. He endorses orthopnea.  He also had a cough which is been productive of green sputum.  His pulmonologist is Dr. [name] who put him on a Z-Pak yesterday.  He denies fevers or chills.  He is currently symptom-free but has had this pain symptom since his visit here today.  The history is provided by the patient.       Past Medical History:  Diagnosis Date  . Arthritis   . COPD (chronic obstructive pulmonary disease) ([place]) 2004   diagnosed in 2004, no PFT's to date.  Started on home O2 12/2013, after found to be desatting at PCP's office, and referred to pulmonology.  . Depression   . GERD (gastroesophageal  reflux disease)   . High blood pressure   . Prediabetes   . Seasonal allergies   . Tobacco dependence     Patient Active Problem List   Diagnosis Date Noted  . Hyperlipidemia 05/24/2020  . Hypokalemia 05/24/2020  . Chest pain 05/23/2020  . Bipolar disorder ([place]) 05/23/2020  . HNP (herniated nucleus pulposus), cervical 09/06/2019  . Abdominal pain   . Poor dentition 11/04/2017  . Acute on chronic respiratory failure with hypoxia ([place]) 08/29/2017  . ETOH abuse 03/30/2017  . Cigarette smoker 03/30/2017  . Acute respiratory failure with hypoxia and hypercapnia ([place]) 03/29/2017  . Special screening for malignant neoplasms, colon   . Benign neoplasm of ascending colon   . Benign neoplasm of descending colon   . Hoarseness 08/22/2016  . GERD (gastroesophageal reflux disease) 08/22/2016  . Atypical chest pain 12/05/2015  . Essential hypertension 06/15/2014  . Pectoralis muscle strain 01/11/2014  . COPD with acute exacerbation ([place]) 01/10/2014  . COPD  GOLD III 01/08/2014  . Smoker 01/08/2014  . Cough 01/08/2014  . Chronic respiratory failure with hypoxia ([place]) 01/08/2014    Past Surgical History:  Procedure Laterality Date  . ANTERIOR CERVICAL DECOMP/DISCECTOMY FUSION N/A 09/06/2019   Procedure: Anterior Cervical Discecectomy Fusion - Cerivcal Five-Cervical Six;  Surgeon: Kary Kos, MD;  Location: [place];  Service: Neurosurgery;  Laterality: N/A;  Anterior Cervical Discecectomy Fusion - Cerivcal Five-Cervical Six  . ANTERIOR CERVICAL DISCECTOMY  09/06/2019  . APPENDECTOMY  1980  . COLONOSCOPY WITH PROPOFOL N/A 12/10/2016   Procedure: COLONOSCOPY WITH PROPOFOL;  Surgeon: [name], MD;  Location: [facility];  Service: Endoscopy;  Laterality: N/A;  . HIP SURGERY Right   . SHOULDER ARTHROSCOPY Right        Family History  Problem Relation Age of Onset  . Emphysema Mother   . Allergies Mother        "everyone in family"  . Asthma Mother        "everyone in family"  .  Heart disease Mother   . Clotting disorder Mother   . Cancer Mother   . Emphysema Father   . Allergies Father   . Allergies Son   . Seizures Brother   . Diabetes Brother     Social History   Tobacco Use  . Smoking status: Current Some Day Smoker    Packs/day: 0.00    Years: 36.00    Pack years: 0.00    Types: Cigarettes  . Smokeless tobacco: Never Used  . Tobacco comment: down to 5-6 cigs a day  Vaping Use  . Vaping Use: Never used  Substance Use Topics  . Alcohol use: Not Currently    Alcohol/week: 14.0 - 21.0 standard drinks    Types: 14 - 21 Standard drinks or equivalent per week    Comment: 2-3 beers/day, with occasional "binges".  No history of withdrawal  . Drug use: Not Currently    Types: Marijuana, Cocaine    Comment: as of November has not used for a month    Home Medications Prior to Admission medications   Medication Sig Start Date End Date Taking? Authorizing Provider  albuterol (PROAIR HFA) 108 (90 Base) MCG/ACT inhaler Inhale 2 puffs into the lungs every 4 (four) hours as needed. INHALE TWO PUFFS UP TO EVERY 4 HOURS IF NEEDED FOR SHORT OF BREATH Patient taking differently: Inhale 2 puffs into the lungs every 4 (four) hours as needed.  05/11/20   [name], MD  albuterol (PROVENTIL) (2.5 MG/3ML) 0.083% nebulizer solution Take 3 mLs (2.5 mg total) by nebulization every 4 (four) hours as needed for wheezing or shortness of breath. 04/28/18   [name], NP  atorvastatin (LIPITOR) 40 MG tablet Take 1 tablet (40 mg total) by mouth daily. 05/24/20 06/23/20  Darliss Cheney, MD  azithromycin (ZITHROMAX) 250 MG tablet Take 1 tablet (250 mg total) by mouth daily. 05/29/20   [name], MD  benztropine (COGENTIN) 0.5 MG tablet Take 0.5 mg by mouth at bedtime.     [provider]  budesonide-formoterol (SYMBICORT) 160-4.5 MCG/ACT inhaler INHALE TWO PUFFS BY MOUTH INTO THE LUNGS TWICE A DAY Patient taking differently: Inhale 2 puffs into the lungs in  the morning and at bedtime. INHALE TWO PUFFS BY MOUTH INTO THE LUNGS TWICE A DAY 05/10/20   [name], MD  cetirizine (ZYRTEC) 10 MG tablet Take 10 mg by mouth daily.    [provider]  famotidine (PEPCID) 20 MG tablet TAKE ONE TABLET BY MOUTH DAILY AT BEDTIME Patient taking differently: Take 20 mg by mouth at bedtime.  03/08/20   [name], MD  fluticasone (FLONASE) 50 MCG/ACT nasal spray Place 2 sprays into both nostrils 3 (three) times daily as needed for allergies.     [provider]  hydrochlorothiazide (MICROZIDE) 12.5 MG capsule TAKE ONE CAPSULE BY  MOUTH EVERY MORNING Patient taking differently: Take 12.5 mg by mouth in the morning.  05/16/20   Tolia, Sunit, DO  latanoprost (XALATAN) 0.005 % ophthalmic solution Place 1 drop into both eyes at bedtime. 05/24/19   [provider]  losartan (COZAAR) 100 MG tablet Take 100 mg by mouth daily.    [provider]  loxapine (LOXITANE) 10 MG capsule Take 10 mg by mouth at bedtime.     [provider]  mirtazapine (REMERON) 30 MG tablet Take 30 mg by mouth at bedtime.    [provider]  Multiple Vitamin (MULTIVITAMIN) capsule Take 1 capsule by mouth daily.    [provider]  OXYGEN Inhale 2 L into the lungs as directed. 2-4 liters with sleep and exertion as needed for low oxygen    [provider]  pantoprazole (PROTONIX) 20 MG tablet Take 1 tablet (20 mg total) by mouth daily. 05/30/20   Allaina Brotzman, Almyra Free, PA-C  polyethylene glycol (MIRALAX / GLYCOLAX) 17 g packet TAKE 17 GRAMS BY MOUTH DAILY Patient taking differently: Take 17 g by mouth daily.  03/09/20   [name], MD  predniSONE (DELTASONE) 10 MG tablet Take 1 tablet (10 mg total) by mouth daily with breakfast. Patient taking differently: Take 20 mg by mouth daily with breakfast. Patient takes 10mg  every other day, and 20 mg on other days. 05/03/20   [name], NP  risperiDONE (RISPERDAL) 2 MG tablet Take 2 mg by  mouth at bedtime.    [provider]  Tiotropium Bromide Monohydrate (SPIRIVA RESPIMAT) 2.5 MCG/ACT AERS INHALE TWO PUFFS BY MOUTH EVERY MORNING Patient taking differently: Inhale 2 puffs into the lungs in the morning.  05/10/20   [name], MD    Allergies    Penicillins and Levocetirizine  Review of Systems   Review of Systems  Constitutional: Negative for chills and fever.  HENT: Negative for congestion and sore throat.   Eyes: Negative.   Respiratory: Positive for shortness of breath. Negative for chest tightness.   Cardiovascular: Positive for chest pain.  Gastrointestinal: Negative for abdominal pain, nausea and vomiting.  Genitourinary: Negative.   Musculoskeletal: Positive for neck pain. Negative for arthralgias and joint swelling.  Skin: Negative.  Negative for rash and wound.  Neurological: Positive for headaches. Negative for dizziness, weakness, light-headedness and numbness.  Psychiatric/Behavioral: Negative.     Physical Exam Updated Vital Signs BP 122/80   Pulse 68   Temp (!) 97 F (36.1 C) (Oral)   Resp (!) 23   Ht 5\' 11"  (1.803 m)   Wt 69.9 kg   SpO2 100%   BMI 21.49 kg/m   Physical Exam Vitals and nursing note reviewed.  Constitutional:      Appearance: He is well-developed.  HENT:     Head: Normocephalic and atraumatic.  Eyes:     Conjunctiva/sclera: Conjunctivae normal.  Cardiovascular:     Rate and Rhythm: Normal rate and regular rhythm.     Heart sounds: Normal heart sounds.  Pulmonary:     Effort: Pulmonary effort is normal.     Breath sounds: Wheezing present. No rhonchi or rales.  Abdominal:     General: Bowel sounds are normal.     Palpations: Abdomen is soft.     Tenderness: There is no abdominal tenderness.  Musculoskeletal:        General: Normal range of motion.     Cervical back: Normal range of motion.     Right lower leg:  No tenderness. No edema.     Left lower leg: No tenderness. No edema.  Skin:    General:  Skin is warm and dry.  Neurological:     Mental Status: He is alert.     ED Results / Procedures / Treatments   Labs (all labs ordered are listed, but only abnormal results are displayed) Labs Reviewed  BASIC METABOLIC PANEL - Abnormal; Notable for the following components:      Result Value   Potassium 3.4 (*)    BUN 29 (*)    Creatinine, Ser 1.50 (*)    Calcium 8.7 (*)    GFR calc non Af Amer 51 (*)    GFR calc Af Amer 59 (*)    All other components within normal limits  CBC - Abnormal; Notable for the following components:   WBC 15.3 (*)    MCV 101.0 (*)    RDW 15.7 (*)    All other components within normal limits  GLUCOSE, CAPILLARY - Abnormal; Notable for the following components:   Glucose-Capillary 212 (*)    All other components within normal limits  BRAIN NATRIURETIC PEPTIDE - Abnormal; Notable for the following components:   B Natriuretic Peptide 258.0 (*)    All other components within normal limits  TROPONIN I (HIGH SENSITIVITY) - Abnormal; Notable for the following components:   Troponin I (High Sensitivity) 19 (*)    All other components within normal limits  GLUCOSE, CAPILLARY  TROPONIN I (HIGH SENSITIVITY)    EKG EKG Interpretation  Date/Time:  Monday May 29 2020 22:08:14 EDT Ventricular Rate:  85 PR Interval:  138 QRS Duration: 82 QT Interval:  380 QTC Calculation: 452 R Axis:   -83 Text Interpretation: Normal sinus rhythm Right atrial enlargement Left axis deviation Pulmonary disease pattern Abnormal ECG No significant change was found Artifact Confirmed by Ezequiel Essex [phone]) on 05/30/2020 4:44:05 AM   Radiology DG Chest 1 View  Result Date: 05/29/2020 CLINICAL DATA:  Chest pain EXAM: CHEST  1 VIEW COMPARISON:  05/23/2020, CT 05/12/2018 FINDINGS: Hyperinflation with emphysematous disease and chronic bronchitic changes. No consolidation or effusion. Normal heart size. No pneumothorax. Surgical hardware in the cervical spine. IMPRESSION: No  active cardiopulmonary disease. Hyperinflation with emphysematous disease and chronic bronchitic changes. Electronically Signed   By: Donavan Foil M.D.   On: 05/29/2020 22:31    Procedures Procedures (including critical care time)  Medications Ordered in ED Medications  pantoprazole (PROTONIX) EC tablet 40 mg (40 mg Oral Given 05/30/20 1048)    ED Course  I have reviewed the triage vital signs and the nursing notes.  Pertinent labs & imaging results that were available during my care of the patient were reviewed by me and considered in my medical decision making (see chart for details).    MDM Rules/Calculators/A&P                           Labs, imaging and EKG reviewed along with patient's recent hospitalization and cardiac work-up which was reassuring and no evidence of unstable ACS.  His symptoms are atypical, has not significantly improved with Pepcid which he has started, we have bumped him up to Protonix for better control of suspected GERD.  We also discussed his elevated BUN and creatinine, advised that he needs to increase his fluid intake. He is also on hctz, advised to continue this medicine, elevate feet for edema, no sig edema on exam today.  CXR  clear, no clear evidence of chf.  He was told to get a recheck appointment by his PCP within 1 week and to make sure his kidneys are rechecked with this visit.  Patient was discussed with Dr. Eulis Foster prior to discharge home.  The patient appears reasonably screened and/or stabilized for discharge and I doubt any other medical condition or other [facility] requiring further screening, evaluation, or treatment in the ED at this time prior to discharge.    Final Clinical Impression(s) / ED Diagnoses Final diagnoses:  Atypical chest pain  Gastroesophageal reflux disease, unspecified whether esophagitis present    Rx / DC Orders ED Discharge Orders         Ordered    pantoprazole (PROTONIX) 20 MG tablet  Daily     Discontinue  Reprint       05/30/20 0958           Evalee Jefferson, PA-C 05/30/20 1005    Evalee Jefferson, PA-C 05/30/20 1052    Daleen Bo, MD 05/31/20 0730

## 2020-05-30 NOTE — ED Notes (Signed)
Patient has a pea size area at top of buttocks that he says hurts beginning signs of breakdown noted.  Patient positioned off of buttocks and education provided.  Mepilex boarder lite applied to area.

## 2020-05-30 NOTE — Discharge Instructions (Signed)
Your lab tests, ekg and chest xray are all reassuring today.  You have been prescribed a stronger medicine for this suspected GERD.  Plan a recheck by your primary MD this week.  Of note,  you do appear dehydrated by your blood tests today - make sure you are increasing your fluid intake.  You should have your kidney function rechecked by your primary MD with your visit.

## 2020-05-31 ENCOUNTER — Encounter (HOSPITAL_COMMUNITY): Payer: Medicaid Other | Admitting: Physical Therapy

## 2020-06-06 ENCOUNTER — Encounter: Payer: Medicaid Other | Admitting: Physical Therapy

## 2020-06-06 ENCOUNTER — Ambulatory Visit (HOSPITAL_COMMUNITY): Payer: Medicaid Other | Attending: Student | Admitting: Physical Therapy

## 2020-06-06 ENCOUNTER — Other Ambulatory Visit: Payer: Self-pay

## 2020-06-06 ENCOUNTER — Encounter (HOSPITAL_COMMUNITY): Payer: Self-pay | Admitting: Physical Therapy

## 2020-06-06 DIAGNOSIS — R2689 Other abnormalities of gait and mobility: Secondary | ICD-10-CM

## 2020-06-06 DIAGNOSIS — M6281 Muscle weakness (generalized): Secondary | ICD-10-CM | POA: Diagnosis not present

## 2020-06-06 NOTE — Therapy (Signed)
[facility] [address] [place], [place], [address] Phone: [phone]   Fax:  [phone]  Physical Therapy Treatment  Patient Details  Name: Tony Sullivan MRN: [id number] Date of Birth: [birth year] Referring Provider (PT): Glenford Peers NP  PHYSICAL THERAPY DISCHARGE SUMMARY  Visits from Start of Care: 7  Current functional level related to goals / functional outcomes: See below   Remaining deficits: See below    Education / Equipment: See assessment  Plan: Patient agrees to discharge.  Patient goals were partially met. Patient is being discharged due to                                                     ?????      Encounter Date: 06/06/2020   Max benefit of OPPT reached    PT End of Session - 06/06/20 1324    Visit Number 7    Number of Visits 16    Authorization Type Medicaid    Authorization Time Period 05/01/20-06/11/20    Authorization - Visit Number 2    Authorization - Number of Visits 12    PT Start Time 5093    PT Stop Time 1340    PT Time Calculation (min) 35 min    Activity Tolerance Patient limited by fatigue;Other (comment)    Behavior During Therapy WFL for tasks assessed/performed           Past Medical History:  Diagnosis Date  . Arthritis   . COPD (chronic obstructive pulmonary disease) ([place]) 2004   diagnosed in 2004, no PFT's to date.  Started on home O2 12/2013, after found to be desatting at PCP's office, and referred to pulmonology.  . Depression   . GERD (gastroesophageal reflux disease)   . High blood pressure   . Prediabetes   . Seasonal allergies   . Tobacco dependence     Past Surgical History:  Procedure Laterality Date  . ANTERIOR CERVICAL DECOMP/DISCECTOMY FUSION N/A 09/06/2019   Procedure: Anterior Cervical Discecectomy Fusion - Cerivcal Five-Cervical Six;  Surgeon: Kary Kos, MD;  Location: [facility];  Service: Neurosurgery;  Laterality: N/A;  Anterior Cervical Discecectomy Fusion  - Cerivcal Five-Cervical Six  . ANTERIOR CERVICAL DISCECTOMY  09/06/2019  . APPENDECTOMY  1980  . COLONOSCOPY WITH PROPOFOL N/A 12/10/2016   Procedure: COLONOSCOPY WITH PROPOFOL;  Surgeon: [name], MD;  Location[facility];  Service: Endoscopy;  Laterality: N/A;  . HIP SURGERY Right   . SHOULDER ARTHROSCOPY Right     There were no vitals filed for this visit.   Subjective Assessment - 06/06/20 1324    Subjective Patient says he had neck and back surgery and has ben trying to get his conditioning back up since then. Says he is wanting to do more walking and standing and get out of his power chair. Patient says he is mostly wanting to improve his lung function right now. He reports chronic history of COPD which impacts his breathing. Patient says he uses oxygen when exercising/ doing activity and at night when sleeping, but does not always use it during the day. Patient says he has been walking some with cane around his house. Says this is going well but he gets tired quickly. Patient states he feels his primary limiting factor at this time is his lung  function and breathing.    Currently in Pain? No/denies              [facility] PT Assessment - 06/06/20 0001      Assessment   Medical Diagnosis myelopathy Rt LE    Referring Provider (PT) Glenford Peers NP    Onset Date/Surgical Date 06/23/19    Prior Therapy [facility] right after his surgery 4-5 months ago      Precautions   Precautions None      Restrictions   Weight Bearing Restrictions No      [place] residence      Prior Function   Level of Independence Needs assistance with ADLs    Vocation On disability    Leisure sedentary life style      Strength   Right/Left Hip Right;Left    Right Hip Flexion 4+/5    Left Hip Flexion 4+/5    Right/Left Knee Right;Left    Right Knee Extension 4+/5    Left Knee Extension 5/5    Right/Left Ankle Right;Left    Right Ankle Dorsiflexion 4/5     Left Ankle Dorsiflexion 4+/5      Transfers   Five time sit to stand comments  14.25 sec with no UE (3L O2)    Sp02 at 93%     Ambulation/Gait   Ambulation/Gait Yes    Ambulation/Gait Assistance 6: Modified independent (Device/Increase time)    Ambulation Distance (Feet) 125 Feet    Assistive device Straight cane    Gait Pattern Decreased dorsiflexion - right    Ambulation Surface Level;Indoor    Gait Comments 1MWT; 3L O2 (SP02 97%)                                 PT Education - 06/06/20 1711    Education Details on DC from OPPT and referral for cardiopulmonary rehab    Person(s) Educated Patient    Methods Explanation    Comprehension Verbalized understanding            PT Short Term Goals - 06/06/20 1658      PT SHORT TERM GOAL #1   Title I with initial HEP    Baseline not performing any exercise    Time 4    Period Weeks    Status Achieved    Target Date 04/19/20      PT SHORT TERM GOAL #2   Title ambulate 53' with straight cane keeping oxygen sats above 92%    Baseline 125 feet in 1 minute with SPC, Spo2 at 96-97% on 3L O2    Time 4    Period Weeks    Status Achieved    Target Date 04/19/20      PT SHORT TERM GOAL #3   Title improve Rt LE strength =/> 4+/5 to assist with gait    Baseline See MMT    Time 4    Period Weeks    Status Partially Met    Target Date 04/19/20             PT Long Term Goals - 06/06/20 1659      PT LONG TERM GOAL #1   Title Pt will be able to tolerate standing activities >/= 10 minutes with stable spO2 >90% on O2    Baseline Reports he is unable per SpO2    Time 6    Period  Weeks    Status Not Met      PT LONG TERM GOAL #2   Title Pt will be able to ascend/descend 5 steps without railing and stable spO2 >90% on O2    Baseline Unable    Time 6    Period Weeks    Status Deferred      PT LONG TERM GOAL #3   Title Pt will be able to perfrom 5x sit<>stand <13 sec for reduced fall risk with spO2 >/=  90% on O2    Baseline 14.25 sec with no UE (3L O2) SpO2 93%    Time 6    Period Weeks    Status Not Met      PT LONG TERM GOAL #4   Title Pt will be independent with advanced HEP    Baseline Patient reports compliance with functional activity at home, but states he is primarily limited by cardio/pulm function    Time 6    Period Weeks    Status Deferred      PT LONG TERM GOAL #5   Title Pt has goals to amb 20 minutes with rest breaks as needed    Baseline Unable    Time 6    Period Weeks    Status Not Met                 Plan - 06/06/20 1703    Clinical Impression Statement Reassessment performed per nearing end of patient certificated period. Patient demos fair progress toward therapy goals, related to strength and functional mobility. Patient remains limited in progress toward goals centered around cardio/ pulmonary function likely in relation to patient history of chronic COPD. At this time it is recommended that patient is DC from [place] and would benefit from referral to cardio-pulmonary rehab facility per remaining ongoing deficits and patient stated subjective complaint. Patient states he feels this is what is limiting him with current functional level and ADLs, versus pain or muscle weakness. Patient educated on this transition and agrees to this plan.  Patient instructed to follow up with therapy services with any further questions or concerns.    Personal Factors and Comorbidities Comorbidity 3+    Comorbidities ACDF, lumbar compression fx, severe COPD O2 dependent, depressin, arthritis, GERD HTN, Rt hip and shoulder surgeries    Examination-Activity Limitations Bathing;Bed Mobility;Locomotion Level;Dressing;Stand;Stairs;Hygiene/Grooming    Examination-Participation Restrictions Community Activity;Meal Prep    Stability/Clinical Decision Making Evolving/Moderate complexity    Rehab Potential Fair    PT Frequency 2x / week    PT Duration 4 weeks    PT  Treatment/Interventions Gait training;Taping;Patient/family education;Functional mobility training;Moist Heat;Passive range of motion;Therapeutic exercise;Cryotherapy;Electrical Stimulation;Neuromuscular re-education;Balance training;Manual techniques    PT Next Visit Plan DC. Refer for cardiopulmonary rehab    PT Home Exercise Plan Access Code: [email or web address]. Side stepping, marching and sit t/f standing for activity tolerance.    Consulted and Agree with Plan of Care Patient           Patient will benefit from skilled therapeutic intervention in order to improve the following deficits and impairments:  Abnormal gait, Difficulty walking, Decreased strength, Decreased balance, Decreased activity tolerance, Cardiopulmonary status limiting activity  Visit Diagnosis: Muscle weakness (generalized)  Other abnormalities of gait and mobility     Problem List Patient Active Problem List   Diagnosis Date Noted  . Hyperlipidemia 05/24/2020  . Hypokalemia 05/24/2020  . Chest pain 05/23/2020  . Bipolar disorder ([place]) 05/23/2020  . HNP (herniated nucleus pulposus), cervical  09/06/2019  . Abdominal pain   . Poor dentition 11/04/2017  . Acute on chronic respiratory failure with hypoxia ([place]) 08/29/2017  . ETOH abuse 03/30/2017  . Cigarette smoker 03/30/2017  . Acute respiratory failure with hypoxia and hypercapnia ([place]) 03/29/2017  . Special screening for malignant neoplasms, colon   . Benign neoplasm of ascending colon   . Benign neoplasm of descending colon   . Hoarseness 08/22/2016  . GERD (gastroesophageal reflux disease) 08/22/2016  . Atypical chest pain 12/05/2015  . Essential hypertension 06/15/2014  . Pectoralis muscle strain 01/11/2014  . COPD with acute exacerbation ([place]) 01/10/2014  . COPD  GOLD III 01/08/2014  . Smoker 01/08/2014  . Cough 01/08/2014  . Chronic respiratory failure with hypoxia ([place]) 01/08/2014    5:14 PM, 06/06/20 Josue Hector PT DPT  Physical Therapist  with [facility]  [phone] [place] [address] [place], [place], [address] Phone: [phone]   Fax:  [phone]  Name: Tony Sullivan MRN: [id number] Date of Birth: 07/29/1961

## 2020-06-08 ENCOUNTER — Ambulatory Visit (HOSPITAL_COMMUNITY): Payer: Medicaid Other | Admitting: Physical Therapy

## 2020-06-08 ENCOUNTER — Telehealth (HOSPITAL_COMMUNITY): Payer: Self-pay | Admitting: Physical Therapy

## 2020-06-08 NOTE — Telephone Encounter (Signed)
Patient wants you to call him - he states he called [facility] and they told him they would not take him if he could not walk into their building and that he needed to continue here. I informaed he has been d/c

## 2020-06-08 NOTE — Telephone Encounter (Signed)
Cam has sent referral for MD to sign for Pulmonary Rehab and the pt will call the MD if they have not hear from MD by Monday

## 2020-06-12 ENCOUNTER — Other Ambulatory Visit: Payer: Self-pay | Admitting: Internal Medicine

## 2020-06-30 ENCOUNTER — Ambulatory Visit (HOSPITAL_COMMUNITY): Payer: Medicaid Other | Admitting: Physical Therapy

## 2020-07-06 ENCOUNTER — Other Ambulatory Visit: Payer: Self-pay | Admitting: Primary Care

## 2020-07-11 ENCOUNTER — Other Ambulatory Visit: Payer: Self-pay | Admitting: Internal Medicine

## 2020-07-17 ENCOUNTER — Telehealth (HOSPITAL_COMMUNITY): Payer: Self-pay | Admitting: Physical Therapy

## 2020-07-17 ENCOUNTER — Encounter (HOSPITAL_COMMUNITY): Payer: Self-pay

## 2020-07-17 ENCOUNTER — Ambulatory Visit (HOSPITAL_COMMUNITY): Payer: Medicaid Other | Admitting: Physical Therapy
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
## 2020-07-18 ENCOUNTER — Emergency Department (HOSPITAL_COMMUNITY): Payer: Medicaid Other

## 2020-07-18 ENCOUNTER — Other Ambulatory Visit: Payer: Self-pay

## 2020-07-18 ENCOUNTER — Encounter (HOSPITAL_COMMUNITY): Payer: Self-pay | Admitting: Emergency Medicine

## 2020-07-18 ENCOUNTER — Emergency Department (HOSPITAL_COMMUNITY)
Admission: EM | Admit: 2020-07-18 | Discharge: 2020-07-18 | Disposition: A | Discharge status: Home / Self Care | Payer: Medicaid Other | Attending: Emergency Medicine | Admitting: Emergency Medicine

## 2020-07-18 DIAGNOSIS — J449 Chronic obstructive pulmonary disease, unspecified: Secondary | ICD-10-CM | POA: Insufficient documentation

## 2020-07-18 DIAGNOSIS — F17210 Nicotine dependence, cigarettes, uncomplicated: Secondary | ICD-10-CM | POA: Insufficient documentation

## 2020-07-18 DIAGNOSIS — I10 Essential (primary) hypertension: Secondary | ICD-10-CM | POA: Diagnosis not present

## 2020-07-18 DIAGNOSIS — Z79899 Other long term (current) drug therapy: Secondary | ICD-10-CM | POA: Diagnosis not present

## 2020-07-18 DIAGNOSIS — R079 Chest pain, unspecified: Secondary | ICD-10-CM

## 2020-07-18 DIAGNOSIS — R0789 Other chest pain: Secondary | ICD-10-CM

## 2020-07-18 LAB — BASIC METABOLIC PANEL
Anion gap: 13 (ref 5–15)
BUN: 19 mg/dL (ref 6–20)
CO2: 21 mmol/L — ABNORMAL LOW (ref 22–32)
Calcium: 8.5 mg/dL — ABNORMAL LOW (ref 8.9–10.3)
Chloride: 104 mmol/L (ref 98–111)
Creatinine, Ser: 1.04 mg/dL (ref 0.61–1.24)
GFR calc Af Amer: >60 mL/min (ref >60)
GFR calc non Af Amer: >60 mL/min (ref >60)
Glucose, Bld: 77 mg/dL (ref 70–99)
Potassium: 3.8 mmol/L (ref 3.5–5.1)
Sodium: 138 mmol/L (ref 135–145)

## 2020-07-18 LAB — CBC
HCT: 50.2 % (ref 39.0–52.0)
Hemoglobin: 15.8 g/dL (ref 13.0–17.0)
MCH: 32 pg (ref 26.0–34.0)
MCHC: 31.5 g/dL (ref 30.0–36.0)
MCV: 101.8 fL — ABNORMAL HIGH (ref 80.0–100.0)
Platelets: 248 10*3/uL (ref 150–400)
RBC: 4.93 MIL/uL (ref 4.22–5.81)
RDW: 16.6 % — ABNORMAL HIGH (ref 11.5–15.5)
WBC: 11.3 10*3/uL — ABNORMAL HIGH (ref 4.0–10.5)
nRBC: 0 % (ref 0.0–0.2)

## 2020-07-18 LAB — TROPONIN I (HIGH SENSITIVITY)
Troponin I (High Sensitivity): 18 ng/L — ABNORMAL HIGH (ref <18)
Troponin I (High Sensitivity): 18 ng/L — ABNORMAL HIGH (ref <18)

## 2020-07-18 IMAGING — DX DG CHEST 1V
2 series · 2 of 2 positions shown · non-contrast
Comparison: [DATE]

CLINICAL DATA: Chest pain for 1 hours

EXAM:
CHEST  1 VIEW

[chest ap (1 of 2)]
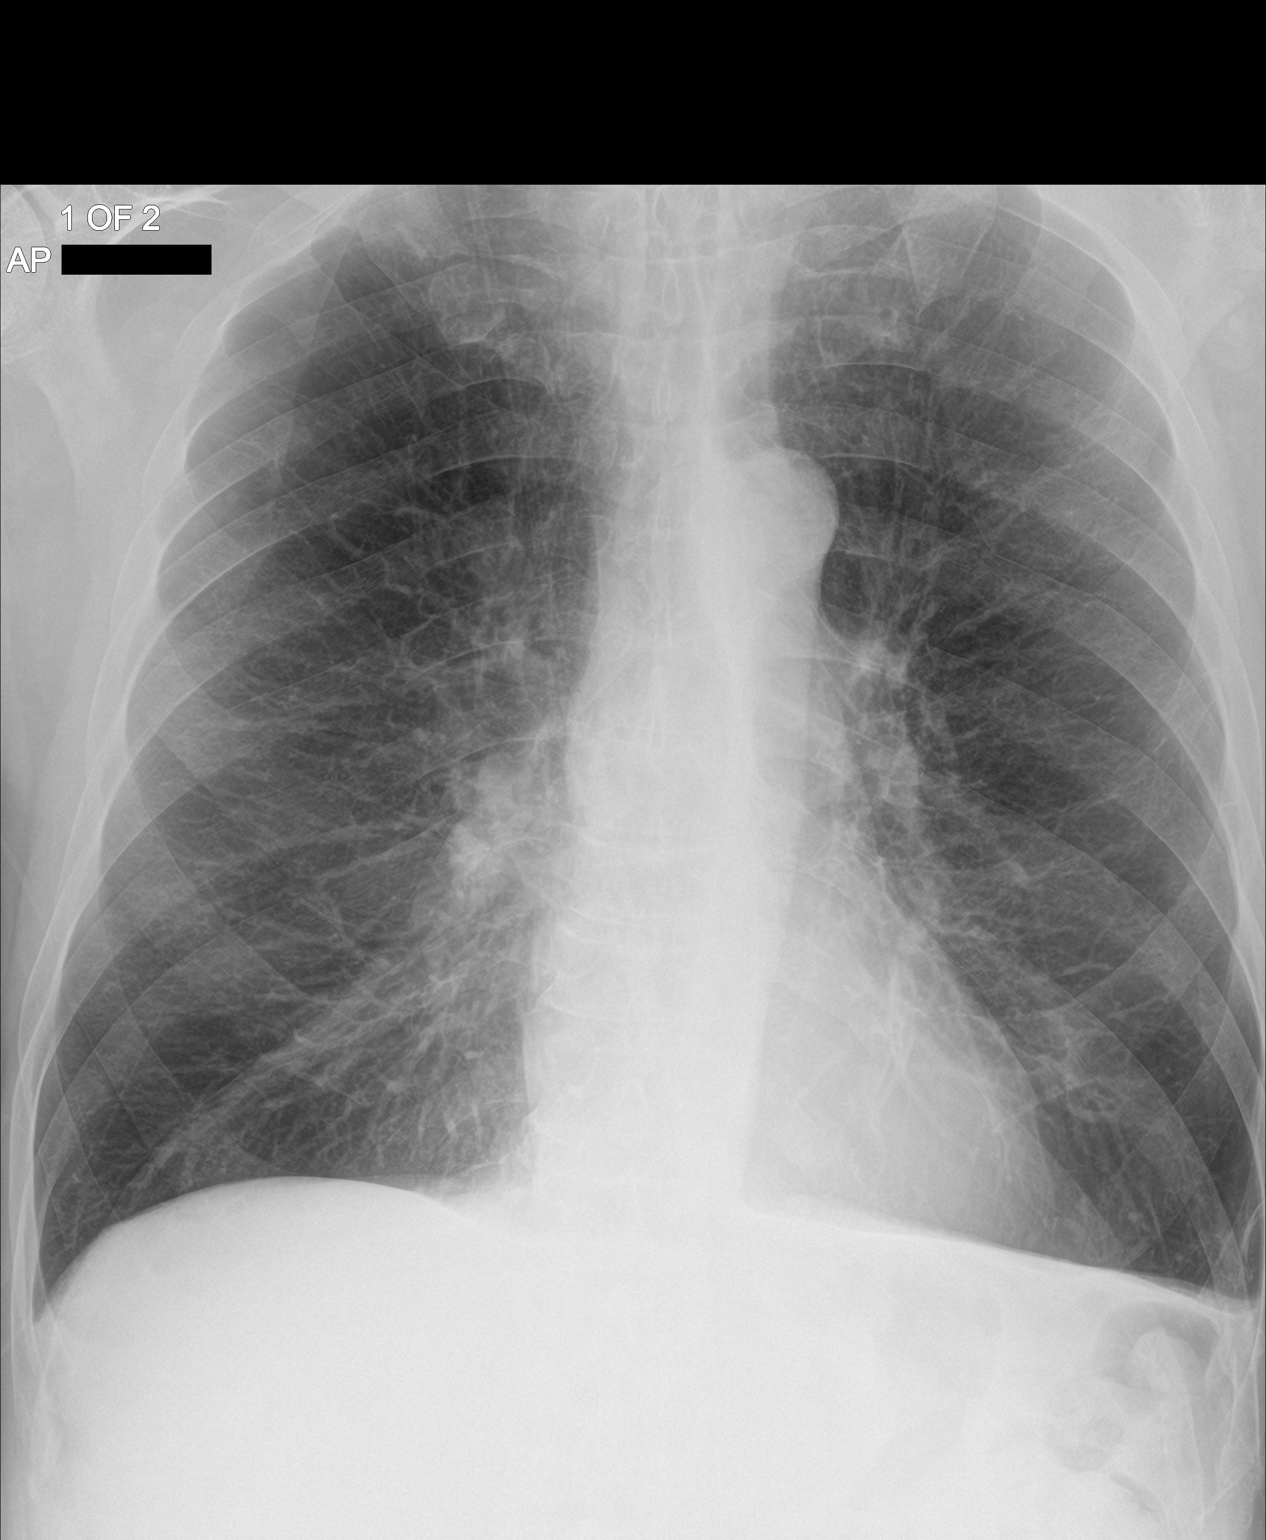

[chest ap (2 of 2)]
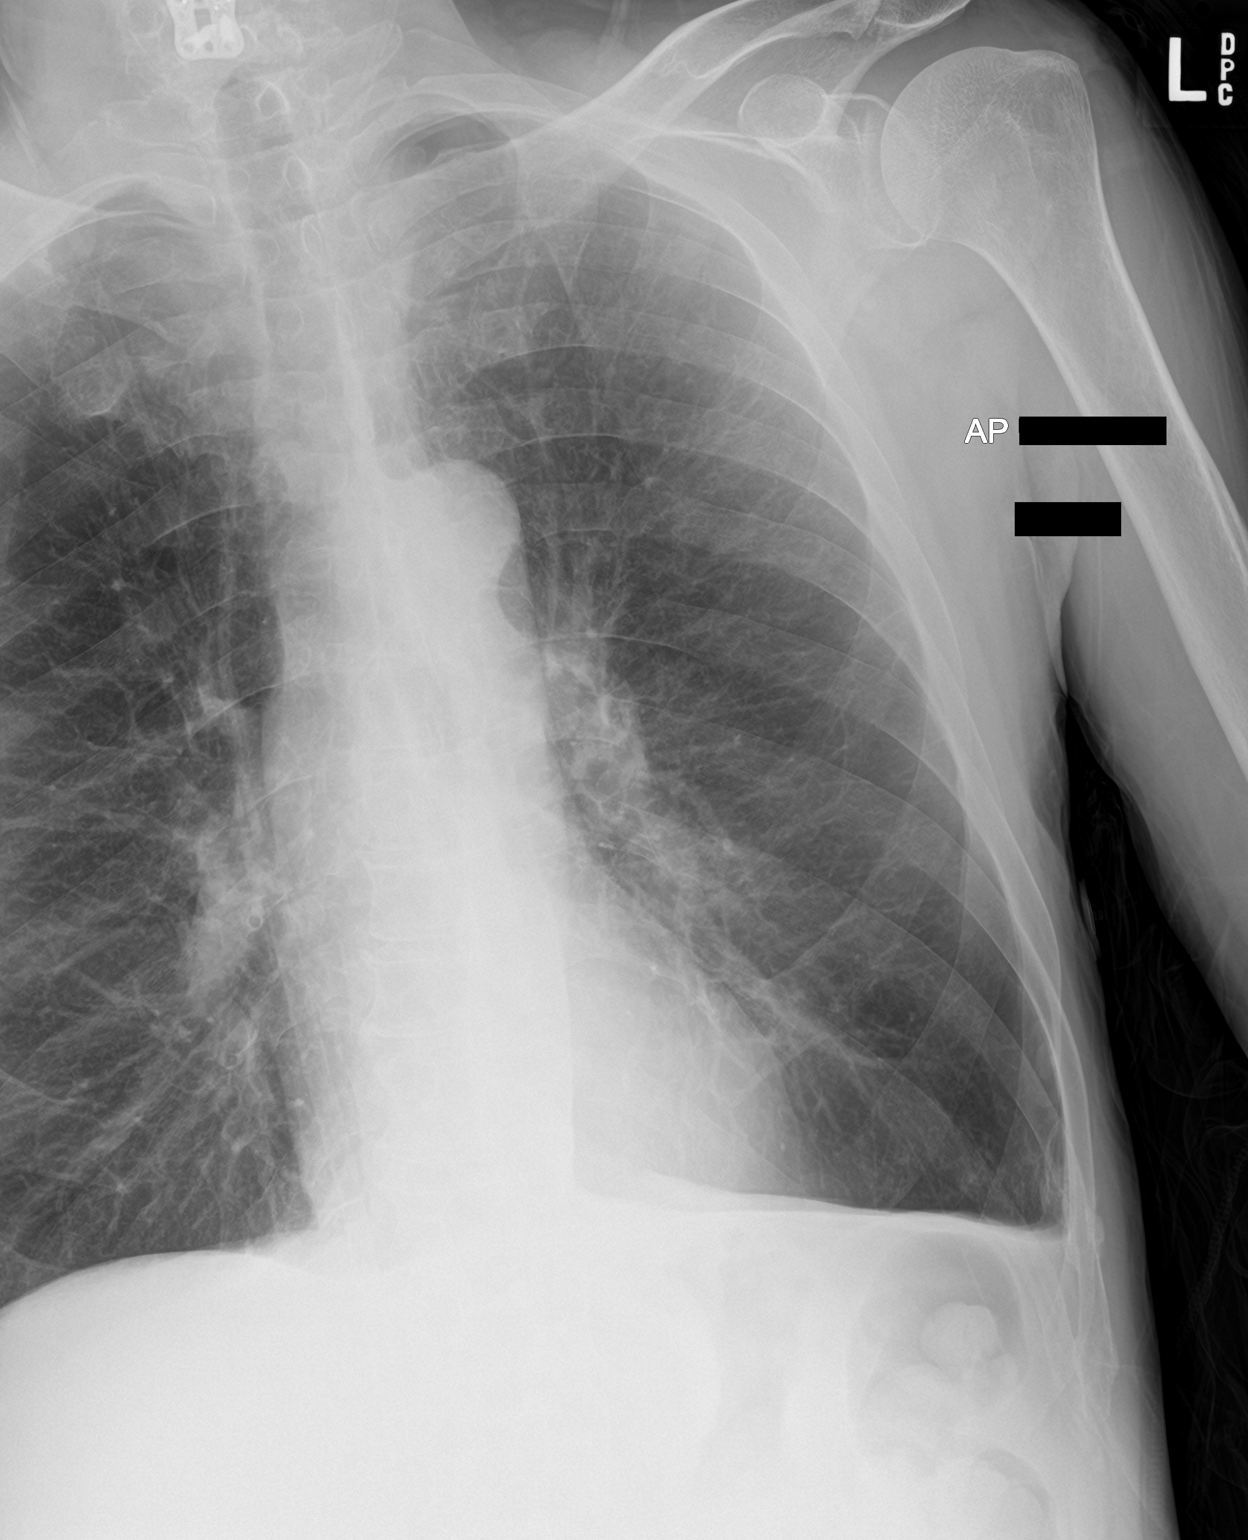

[2 of 2 positions shown; findings below may reference images not displayed]

FINDINGS: Cardiac shadow is within normal limits. The lungs are hypoinflated
without focal infiltrate or sizable effusion. Mild chronic
interstitial changes are seen. No bony abnormality is noted. Chronic
blunting of left costophrenic angle is seen.
IMPRESSION: COPD without acute abnormality.

## 2020-07-18 MED ORDER — PANTOPRAZOLE SODIUM 40 MG IV SOLR
40.0000 mg | Freq: Once | INTRAVENOUS | Status: AC
  Administered 2020-07-18: 40 mg via INTRAVENOUS
  Filled 2020-07-18: qty 40

## 2020-07-18 MED ORDER — HYDROMORPHONE HCL 1 MG/ML IJ SOLN
0.5000 mg | Freq: Once | INTRAMUSCULAR | Status: AC
  Administered 2020-07-18: 0.5 mg via INTRAVENOUS
  Filled 2020-07-18: qty 1

## 2020-07-18 MED ORDER — DIPHENHYDRAMINE HCL 50 MG/ML IJ SOLN: 25.0000 mg | Freq: Once | INTRAMUSCULAR | Status: DC

## 2020-07-18 MED ORDER — GLUCAGON HCL RDNA (DIAGNOSTIC) 1 MG IJ SOLR: 1.0000 mg | Freq: Once | INTRAMUSCULAR | Status: DC

## 2020-07-18 MED ORDER — LORAZEPAM 2 MG/ML IJ SOLN
0.5000 mg | Freq: Once | INTRAMUSCULAR | Status: AC
  Administered 2020-07-18: 0.5 mg via INTRAVENOUS
  Filled 2020-07-18: qty 1

## 2020-07-18 MED ORDER — ALBUTEROL SULFATE HFA 108 (90 BASE) MCG/ACT IN AERS
2.0000 | INHALATION_SPRAY | Freq: Once | RESPIRATORY_TRACT | Status: AC
  Administered 2020-07-18: 2 via RESPIRATORY_TRACT
  Filled 2020-07-18: qty 6.7

## 2020-07-18 MED ORDER — SODIUM CHLORIDE 0.9 % IV BOLUS: 1000.0000 mL | Freq: Once | INTRAVENOUS | Status: DC

## 2020-07-18 NOTE — ED Triage Notes (Signed)
Pt reports chest pain that started 1 hour ago. Denies N/V. Denies SHOB.

## 2020-07-18 NOTE — ED Provider Notes (Signed)
[facility] EMERGENCY DEPARTMENT Provider Note   CSN: [id number] Arrival date & time: 07/18/20  7824     History Chief Complaint  Patient presents with  . Chest Pain    CORDERIUS SARACENI is a 59 y.o. male.  Patient with a history of COPD.  Patient complains of chest tightness.  Patient was recently admitted for chest pain and had a Myoview done that showed low likelihood for cardiac disease.  The history is provided by the patient and medical records. No language interpreter was used.  Chest Pain Pain location:  L chest Pain quality: aching   Pain radiates to:  Does not radiate Pain severity:  Mild Onset quality:  Sudden Timing:  Constant Progression:  Waxing and waning Chronicity:  Recurrent Context: breathing   Relieved by:  Nothing Worsened by:  Nothing Ineffective treatments:  None tried Associated symptoms: no abdominal pain, no back pain, no cough, no fatigue and no headache        Past Medical History:  Diagnosis Date  . Arthritis   . COPD (chronic obstructive pulmonary disease) ([place]) 2004   diagnosed in 2004, no PFT's to date.  Started on home O2 12/2013, after found to be desatting at PCP's office, and referred to pulmonology.  . Depression   . GERD (gastroesophageal reflux disease)   . High blood pressure   . Prediabetes   . Seasonal allergies   . Tobacco dependence     Patient Active Problem List   Diagnosis Date Noted  . Hyperlipidemia 05/24/2020  . Hypokalemia 05/24/2020  . Chest pain 05/23/2020  . Bipolar disorder ([place]) 05/23/2020  . HNP (herniated nucleus pulposus), cervical 09/06/2019  . Abdominal pain   . Poor dentition 11/04/2017  . Acute on chronic respiratory failure with hypoxia ([place]) 08/29/2017  . ETOH abuse 03/30/2017  . Cigarette smoker 03/30/2017  . Acute respiratory failure with hypoxia and hypercapnia ([place]) 03/29/2017  . Special screening for malignant neoplasms, colon   . Benign neoplasm of ascending colon   . Benign neoplasm of  descending colon   . Hoarseness 08/22/2016  . GERD (gastroesophageal reflux disease) 08/22/2016  . Atypical chest pain 12/05/2015  . Essential hypertension 06/15/2014  . Pectoralis muscle strain 01/11/2014  . COPD with acute exacerbation ([place]) 01/10/2014  . COPD  GOLD III 01/08/2014  . Smoker 01/08/2014  . Cough 01/08/2014  . Chronic respiratory failure with hypoxia ([place]) 01/08/2014    Past Surgical History:  Procedure Laterality Date  . ANTERIOR CERVICAL DECOMP/DISCECTOMY FUSION N/A 09/06/2019   Procedure: Anterior Cervical Discecectomy Fusion - Cerivcal Five-Cervical Six;  Surgeon: Kary Kos, MD;  Location: [facility];  Service: Neurosurgery;  Laterality: N/A;  Anterior Cervical Discecectomy Fusion - Cerivcal Five-Cervical Six  . ANTERIOR CERVICAL DISCECTOMY  09/06/2019  . APPENDECTOMY  1980  . COLONOSCOPY WITH PROPOFOL N/A 12/10/2016   Procedure: COLONOSCOPY WITH PROPOFOL;  Surgeon: [name], MD;  [facility];  Service: Endoscopy;  Laterality: N/A;  . HIP SURGERY Right   . SHOULDER ARTHROSCOPY Right        Family History  Problem Relation Age of Onset  . Emphysema Mother   . Allergies Mother        "everyone in family"  . Asthma Mother        "everyone in family"  . Heart disease Mother   . Clotting disorder Mother   . Cancer Mother   . Emphysema Father   . Allergies Father   . Allergies Son   .  Seizures Brother   . Diabetes Brother     Social History   Tobacco Use  . Smoking status: Current Some Day Smoker    Packs/day: 0.00    Years: 36.00    Pack years: 0.00    Types: Cigarettes  . Smokeless tobacco: Never Used  . Tobacco comment: down to 5-6 cigs a day  Vaping Use  . Vaping Use: Never used  Substance Use Topics  . Alcohol use: Not Currently    Alcohol/week: 14.0 - 21.0 standard drinks    Types: 14 - 21 Standard drinks or equivalent per week    Comment: 2-3 beers/day, with occasional "binges".  No history of withdrawal  . Drug use: Not  Currently    Types: Marijuana, Cocaine    Comment: as of November has not used for a month    Home Medications Prior to Admission medications   Medication Sig Start Date End Date Taking? Authorizing Provider  albuterol (PROAIR HFA) 108 (90 Base) MCG/ACT inhaler Inhale 2 puffs into the lungs every 4 (four) hours as needed. INHALE TWO PUFFS UP TO EVERY 4 HOURS IF NEEDED FOR SHORT OF BREATH Patient taking differently: Inhale 2 puffs into the lungs every 4 (four) hours as needed.  05/11/20   [name], MD  albuterol (PROVENTIL) (2.5 MG/3ML) 0.083% nebulizer solution Take 3 mLs (2.5 mg total) by nebulization every 4 (four) hours as needed for wheezing or shortness of breath. 04/28/18   [name], NP  atorvastatin (LIPITOR) 40 MG tablet TAKE ONE TABLET BY MOUTH DAILY 07/06/20   [name], MD  azithromycin (ZITHROMAX) 250 MG tablet Take 1 tablet (250 mg total) by mouth daily. 05/29/20   [name], MD  benztropine (COGENTIN) 0.5 MG tablet Take 0.5 mg by mouth at bedtime.     [provider]  budesonide-formoterol (SYMBICORT) 160-4.5 MCG/ACT inhaler INHALE TWO PUFFS BY MOUTH INTO THE LUNGS TWICE A DAY Patient taking differently: Inhale 2 puffs into the lungs in the morning and at bedtime. INHALE TWO PUFFS BY MOUTH INTO THE LUNGS TWICE A DAY 05/10/20   [name], MD  cetirizine (ZYRTEC) 10 MG tablet Take 10 mg by mouth daily.    [provider]  famotidine (PEPCID) 20 MG tablet Take 1 tablet (20 mg total) by mouth at bedtime. 06/12/20   [name], MD  fluticasone (FLONASE) 50 MCG/ACT nasal spray Place 2 sprays into both nostrils 3 (three) times daily as needed for allergies.     [provider]  hydrochlorothiazide (MICROZIDE) 12.5 MG capsule TAKE ONE CAPSULE BY MOUTH EVERY MORNING Patient taking differently: Take 12.5 mg by mouth in the morning.  05/16/20   Tolia, Sunit, DO  latanoprost (XALATAN) 0.005 % ophthalmic solution Place 1 drop into both  eyes at bedtime. 05/24/19   [provider]  losartan (COZAAR) 100 MG tablet Take 100 mg by mouth daily.    [provider]  loxapine (LOXITANE) 10 MG capsule Take 10 mg by mouth at bedtime.     [provider]  mirtazapine (REMERON) 30 MG tablet Take 30 mg by mouth at bedtime.    [provider]  Multiple Vitamin (MULTIVITAMIN) capsule Take 1 capsule by mouth daily.    [provider]  OXYGEN Inhale 2 L into the lungs as directed. 2-4 liters with sleep and exertion as needed for low oxygen    [provider]  pantoprazole (PROTONIX) 20 MG tablet Take 1 tablet (20  mg total) by mouth daily. 05/30/20   Evalee Jefferson, PA-C  pantoprazole (PROTONIX) 40 MG tablet Take 40 mg by mouth daily.    [provider]  polyethylene glycol (MIRALAX / GLYCOLAX) 17 g packet TAKE 17 GRAMS BY MOUTH DAILY Patient taking differently: Take 17 g by mouth daily.  03/09/20   [name], MD  predniSONE (DELTASONE) 10 MG tablet Take 2 tablets (20 mg total) by mouth daily with breakfast. Patient takes 10mg  every other day, and 20 mg on other days. 06/12/20   [name], NP  risperiDONE (RISPERDAL) 2 MG tablet Take 2 mg by mouth at bedtime.    [provider]  Tiotropium Bromide Monohydrate (SPIRIVA RESPIMAT) 2.5 MCG/ACT AERS INHALE TWO PUFFS BY MOUTH EVERY MORNING Patient taking differently: Inhale 2 puffs into the lungs in the morning.  05/10/20   [name], MD    Allergies    Penicillins and Levocetirizine  Review of Systems   Review of Systems  Constitutional: Negative for appetite change and fatigue.  HENT: Negative for congestion, ear discharge and sinus pressure.   Eyes: Negative for discharge.  Respiratory: Negative for cough.   Cardiovascular: Positive for chest pain.  Gastrointestinal: Negative for abdominal pain and diarrhea.  Genitourinary: Negative for frequency and hematuria.  Musculoskeletal: Negative for back pain.  Skin:  Negative for rash.  Neurological: Negative for seizures and headaches.  Psychiatric/Behavioral: Negative for hallucinations.    Physical Exam Updated Vital Signs BP 127/88   Pulse 91   Temp 98.7 F (37.1 C) (Oral)   Resp 15   Ht 5\' 11"  (1.803 m)   Wt 70.3 kg   SpO2 100%   BMI 21.62 kg/m   Physical Exam Vitals reviewed.  Constitutional:      Appearance: He is well-developed.  HENT:     Head: Normocephalic.     Nose: Nose normal.  Eyes:     General: No scleral icterus.    Conjunctiva/sclera: Conjunctivae normal.  Neck:     Thyroid: No thyromegaly.  Cardiovascular:     Rate and Rhythm: Normal rate and regular rhythm.     Heart sounds: No murmur heard.  No friction rub. No gallop.   Pulmonary:     Breath sounds: No stridor. No wheezing or rales.  Chest:     Chest wall: No tenderness.  Abdominal:     General: There is no distension.     Tenderness: There is no abdominal tenderness. There is no rebound.  Musculoskeletal:        General: Normal range of motion.     Cervical back: Neck supple.  Lymphadenopathy:     Cervical: No cervical adenopathy.  Skin:    Findings: No erythema or rash.  Neurological:     Mental Status: He is alert and oriented to person, place, and time.     Motor: No abnormal muscle tone.     Coordination: Coordination normal.  Psychiatric:        Behavior: Behavior normal.     ED Results / Procedures / Treatments   Labs (all labs ordered are listed, but only abnormal results are displayed) Labs Reviewed  BASIC METABOLIC PANEL - Abnormal; Notable for the following components:      Result Value   CO2 21 (*)    Calcium 8.5 (*)    All other components within normal limits  CBC - Abnormal; Notable for the following components:   WBC 11.3 (*)    MCV 101.8 (*)  RDW 16.6 (*)    All other components within normal limits  TROPONIN I (HIGH SENSITIVITY) - Abnormal; Notable for the following components:   Troponin I (High Sensitivity) 18 (*)     All other components within normal limits  TROPONIN I (HIGH SENSITIVITY) - Abnormal; Notable for the following components:   Troponin I (High Sensitivity) 18 (*)    All other components within normal limits    EKG None  Radiology DG Chest 1 View  Result Date: 07/18/2020 CLINICAL DATA:  Chest pain for 1 hours EXAM: CHEST  1 VIEW COMPARISON:  05/29/2020 FINDINGS: Cardiac shadow is within normal limits. The lungs are hypoinflated without focal infiltrate or sizable effusion. Mild chronic interstitial changes are seen. No bony abnormality is noted. Chronic blunting of left costophrenic angle is seen. IMPRESSION: COPD without acute abnormality. Electronically Signed   By: Inez Catalina M.D.   On: 07/18/2020 03:56    Procedures Procedures (including critical care time)  Medications Ordered in ED Medications  LORazepam (ATIVAN) injection 0.5 mg (0.5 mg Intravenous Given 07/18/20 0933)  HYDROmorphone (DILAUDID) injection 0.5 mg (0.5 mg Intravenous Given 07/18/20 0935)  albuterol (VENTOLIN HFA) 108 (90 Base) MCG/ACT inhaler 2 puff (2 puffs Inhalation Given 07/18/20 0934)  pantoprazole (PROTONIX) injection 40 mg (40 mg Intravenous Given 07/18/20 0933)    ED Course  I have reviewed the triage vital signs and the nursing notes.  Pertinent labs & imaging results that were available during my care of the patient were reviewed by me and considered in my medical decision making (see chart for details).    MDM Rules/Calculators/A&P                          Patient with chest tightness.  Troponin is mildly elevated.  He was recently admitted with similar symptoms before and had a negative Myoview.  Doubt coronary artery disease causing symptoms.  He improved with albuterol I suspect is just for COPD.  Patient will follow up with PCP       This patient presents to the ED for concern of chest tightness this involves an extensive number of treatment options, and is a complaint that carries with it  a high risk of complications and morbidity.  The differential diagnosis includes MI COPD   Lab Tests:   I Ordered, reviewed, and interpreted labs, which included troponin stable but mildly elevated.  White count mildly elevated  Medicines ordered:  I ordered medication albuterol for COPD Imaging Studies ordered:   I ordered imaging studies which included chest x-ray  I independently visualized and interpreted imaging which showed no acute disease  Additional history obtained:   Additional history obtained from records  Previous records obtained and reviewed.  Consultations Obtained:     Reevaluation:  After the interventions stated above, I reevaluated the patient and found mild improvement  Critical Interventions:  .   Final Clinical Impression(s) / ED Diagnoses Final diagnoses:  Atypical chest pain    Rx / DC Orders ED Discharge Orders    None       [name], MD 07/18/20 1112

## 2020-07-18 NOTE — ED Notes (Signed)
Pt for d/c home. Pt to call transportation home. Condition stable, IV d/c cath in tact. Pt tolerated well

## 2020-07-18 NOTE — Discharge Instructions (Addendum)

## 2020-07-24 ENCOUNTER — Telehealth: Payer: Self-pay | Admitting: Internal Medicine

## 2020-07-24 MED ORDER — AZITHROMYCIN 250 MG PO TABS: ORAL_TABLET | ORAL | 0 refills | Status: DC

## 2020-07-24 NOTE — Telephone Encounter (Signed)
zpak   Ok to double prednisone as prev instructed until breathing better then back to 10 mg daily   Keep f/u ov planned for Oct 2021 with all meds in hand - call for earlier appt if not improving after above

## 2020-07-24 NOTE — Telephone Encounter (Signed)
Called and spoke with patient he states that he is tight in chest, has some congestion, green sputum that started a couple days ago. He states that he was tested last Thursday for covid and it was negative. States he gets tested every week. Has vaccines  Dr. [name] please advise

## 2020-07-24 NOTE — Telephone Encounter (Signed)
ATC patient, left detailed message relaying Dr. Gustavus Bryant instructions.  Advised to call with any questions.

## 2020-07-26 NOTE — Telephone Encounter (Signed)
Called and spoke with pt to make sure he had been able to pick up zpak and he stated that he had. Also stated to pt the info about prednisone dosage per MW and he verbalized understanding. Nothing further needed.

## 2020-08-02 ENCOUNTER — Ambulatory Visit (HOSPITAL_COMMUNITY): Payer: Medicaid Other | Attending: Student | Admitting: Physical Therapy

## 2020-08-02 ENCOUNTER — Encounter (HOSPITAL_COMMUNITY): Payer: Self-pay | Admitting: Physical Therapy

## 2020-08-02 ENCOUNTER — Other Ambulatory Visit: Payer: Self-pay

## 2020-08-02 DIAGNOSIS — M6281 Muscle weakness (generalized): Secondary | ICD-10-CM | POA: Diagnosis present

## 2020-08-02 DIAGNOSIS — R2689 Other abnormalities of gait and mobility: Secondary | ICD-10-CM | POA: Insufficient documentation

## 2020-08-02 NOTE — Therapy (Signed)
[facility] [place], [place], [address] Phone: [phone]   Fax:  [phone]  Physical Therapy Evaluation  Patient Details  Name: Tony Sullivan MRN: [id number] Date of Birth: 1961-07-14 Referring Provider (PT): Glenford Peers NP   Encounter Date: 08/02/2020   PT End of Session - 08/02/20 1525    Visit Number 1    Number of Visits 8    Authorization Type Medicaid  PUT IN FOR    PT Start Time 1445    PT Stop Time 1525    PT Time Calculation (min) 40 min    Equipment Utilized During Treatment Gait belt    Activity Tolerance Patient limited by fatigue;Other (comment)    Behavior During Therapy WFL for tasks assessed/performed           Past Medical History:  Diagnosis Date  . Arthritis   . COPD (chronic obstructive pulmonary disease) ([place]) 2004   diagnosed in 2004, no PFT's to date.  Started on home O2 12/2013, after found to be desatting at PCP's office, and referred to pulmonology.  . Depression   . GERD (gastroesophageal reflux disease)   . High blood pressure   . Prediabetes   . Seasonal allergies   . Tobacco dependence     Past Surgical History:  Procedure Laterality Date  . ANTERIOR CERVICAL DECOMP/DISCECTOMY FUSION N/A 09/06/2019   Procedure: Anterior Cervical Discecectomy Fusion - Cerivcal Five-Cervical Six;  Surgeon: Kary Kos, MD;  Location: [facility];  Service: Neurosurgery;  Laterality: N/A;  Anterior Cervical Discecectomy Fusion - Cerivcal Five-Cervical Six  . ANTERIOR CERVICAL DISCECTOMY  09/06/2019  . APPENDECTOMY  1980  . COLONOSCOPY WITH PROPOFOL N/A 12/10/2016   Procedure: COLONOSCOPY WITH PROPOFOL;  Surgeon: [name], MD;  Location: [facility];  Service: Endoscopy;  Laterality: N/A;  . HIP SURGERY Right   . SHOULDER ARTHROSCOPY Right     There were no vitals filed for this visit.    Subjective Assessment - 08/02/20 1449    Subjective The pt was  evaluation on 9/20/202 at this facility.   At that  time the therapist felt that the pt would benefit from pulmonary rehab however if he is not able to walk they will not accept him.  The pt has an aide that comes to assit him for three hours a day.   The patient has home health therapy and had out patient in [place] but he then moved to [place]. He was in an electric wheelchair for over a year, he walks everyday but he does not walk a long distance; he uses a walker for about a minute about 3-4 x a day limited due to SOB.  He had anterior disc fusion on 09/06/2019 after he had an accident in his wheelchair he has residual RT foot weakness from this.    Pertinent History O 2 dependent, cervical surgery; back fx non surgical; pt smokes    How long can you sit comfortably? no problem    How long can you stand comfortably? has not done this.    How long can you walk comfortably? with walker less than a minute.    Patient Stated Goals To be able to walk on his own.    Currently in Pain? Yes    Pain Score 2     Pain Location Neck    Pain Orientation Lower    Pain Descriptors / Indicators Aching    Pain Type Chronic pain  Pain Onset More than a month ago    Pain Frequency Intermittent    Aggravating Factors  not sure    Pain Relieving Factors just comes and goes    Effect of Pain on Daily Activities no affect              [facility] PT Assessment - 08/02/20 0001      Assessment   Medical Diagnosis myelopathy Rt LE    Referring Provider (PT) Glenford Peers NP    Onset Date/Surgical Date 06/23/19    Prior Therapy [facility] right after his surgery 4-5 months ago      Precautions   Precautions None      Restrictions   Weight Bearing Restrictions No      Home Environment   Living Environment Private residence      Prior Function   Level of Independence Needs assistance with ADLs    Vocation On disability    Leisure sedentary life style      Strength   Right Hip Flexion 4+/5    Left Hip Flexion 4/5    Right Knee Extension  4/5    Left Knee Extension 5/5    Right Ankle Dorsiflexion 5/5    Left Ankle Dorsiflexion 3+/5      Transfers   Five time sit to stand comments   18.95 sec with no UE (3L O2)    Sp02 at 93%     Ambulation/Gait   Ambulation/Gait Yes    Ambulation/Gait Assistance 6: Modified independent (Device/Increase time)    Ambulation Distance (Feet) 120 Feet   O2 97    Assistive device Rolling walker;Straight cane    Gait Pattern Decreased dorsiflexion - right    Gait Comments 1.30MWT; 3L O2 (SP02 97%)                      Objective measurements completed on examination: See above findings.               PT Education - 08/02/20 1525    Education Details hep    Person(s) Educated Patient    Methods Explanation;Handout    Comprehension Verbalized understanding            PT Short Term Goals - 08/02/20 1532      PT SHORT TERM GOAL #1   Title I with HEP to improve functional ability    Time 2    Period Weeks    Status New      PT SHORT TERM GOAL #2   Title PT to ambulate 200 ft with RW with O2 levle above 92%    Baseline 162f.t    Time 2    Period Weeks    Status New             PT Long Term Goals - 08/02/20 1533      PT LONG TERM GOAL #1   Title Pt will be able to tolerate standing activities >/= 10 minutes with stable spO2 >90% on O2    Time 4    Period Weeks    Status New    Target Date 09/06/20      PT LONG TERM GOAL #2   Title PT LE and core strength to be increased to allow pt to be able to complete 5 sit to stand in less than 13 seconds    Time 4    Period Weeks    Status New      PT  LONG TERM GOAL #3   Title PT  to be able to walk for 275 feet without resting without O2 going below 92% to improve confidence for walking at home without his aide present.    Time 4    Period Weeks    Status New                  Plan - 08/02/20 1526    Clinical Impression Statement Pt is a 59 yo who is a smoker and has been in an electric  wheelchair when going long distances for a year due to COPD.  He hit something in his wheelchair causing him to fall which injured his neck and back.  He has comprssion fx of the lumbar spine but required an anterior disc fusion of his cervical spine due to myelopathy.  He continues to have Rt LE weakness and desires to be able to walk longer therefore he has been referred to skilled PT.  Evaluation demonstrates postural changes, breathing with accessory mm, Rt LE weakness and decreased activity tolerance.  Mr. Owensby wil benefit from skilled PT to address these issue and maximize his functioning level    Personal Factors and Comorbidities Comorbidity 3+    Comorbidities ACDF, lumbar compression fx, severe COPD O2 dependent, depressin, arthritis, GERD HTN, Rt hip and shoulder surgeries    Examination-Activity Limitations Bathing;Bed Mobility;Locomotion Level;Dressing;Stand;Stairs;Hygiene/Grooming    Examination-Participation Restrictions Community Activity;Meal Prep    Stability/Clinical Decision Making Evolving/Moderate complexity    Clinical Decision Making Moderate    Rehab Potential Fair    PT Frequency 2x / week    PT Duration 4 weeks    PT Treatment/Interventions Gait training;Patient/family education;Functional mobility training;Passive range of motion;Therapeutic exercise;Electrical Stimulation;Neuromuscular re-education;Balance training;Manual techniques;Therapeutic activities    PT Next Visit Plan sit to stand, marching, heel raises , side step and walking with O2    PT Home Exercise Plan ankle pump, LAQ, sitting marching, sit to stand, hip ab/adduction, diaphragmic breathing and instructed to walk one minute every waking hour.           Patient will benefit from skilled therapeutic intervention in order to improve the following deficits and impairments:  Abnormal gait, Difficulty walking, Decreased strength, Decreased balance, Decreased activity tolerance, Cardiopulmonary status limiting  activity  Visit Diagnosis: Other abnormalities of gait and mobility - Plan: PT plan of care cert/re-cert  Muscle weakness (generalized) - Plan: PT plan of care cert/re-cert     Problem List Patient Active Problem List   Diagnosis Date Noted  . Hyperlipidemia 05/24/2020  . Hypokalemia 05/24/2020  . Chest pain 05/23/2020  . Bipolar disorder ([place]) 05/23/2020  . HNP (herniated nucleus pulposus), cervical 09/06/2019  . Abdominal pain   . Poor dentition 11/04/2017  . Acute on chronic respiratory failure with hypoxia ([place]) 08/29/2017  . ETOH abuse 03/30/2017  . Cigarette smoker 03/30/2017  . Acute respiratory failure with hypoxia and hypercapnia ([place]) 03/29/2017  . Special screening for malignant neoplasms, colon   . Benign neoplasm of ascending colon   . Benign neoplasm of descending colon   . Hoarseness 08/22/2016  . GERD (gastroesophageal reflux disease) 08/22/2016  . Atypical chest pain 12/05/2015  . Essential hypertension 06/15/2014  . Pectoralis muscle strain 01/11/2014  . COPD with acute exacerbation ([place]) 01/10/2014  . COPD  GOLD III 01/08/2014  . Smoker 01/08/2014  . Cough 01/08/2014  . Chronic respiratory failure with hypoxia [facility]) 01/08/2014    Rayetta Humphrey, PT CLT [phone] 08/02/2020, 3:39 PM  [address] [place], [place], [address] Phone: [phone]   Fax:  [phone]  Name: Tony Sullivan MRN: [id number] Date of Birth: 05-Oct-1961

## 2020-08-08 ENCOUNTER — Other Ambulatory Visit: Payer: Self-pay | Admitting: Internal Medicine

## 2020-08-16 ENCOUNTER — Ambulatory Visit: Payer: Medicaid Other | Admitting: Internal Medicine

## 2020-08-22 ENCOUNTER — Ambulatory Visit (HOSPITAL_COMMUNITY): Payer: Medicaid Other | Admitting: Physical Therapy

## 2020-08-24 ENCOUNTER — Ambulatory Visit (HOSPITAL_COMMUNITY): Payer: Medicaid Other

## 2020-08-29 ENCOUNTER — Other Ambulatory Visit: Payer: Self-pay | Admitting: Internal Medicine

## 2020-08-29 ENCOUNTER — Telehealth: Payer: Self-pay | Admitting: Internal Medicine

## 2020-08-29 ENCOUNTER — Encounter (HOSPITAL_COMMUNITY): Payer: Medicaid Other | Admitting: Physical Therapy

## 2020-08-29 MED ORDER — BUDESONIDE-FORMOTEROL FUMARATE 160-4.5 MCG/ACT IN AERO: INHALATION_SPRAY | RESPIRATORY_TRACT | 3 refills | Status: DC

## 2020-08-29 MED ORDER — ALBUTEROL SULFATE HFA 108 (90 BASE) MCG/ACT IN AERS: 2.0000 | INHALATION_SPRAY | RESPIRATORY_TRACT | 3 refills | Status: DC | PRN

## 2020-08-29 MED ORDER — SPIRIVA RESPIMAT 2.5 MCG/ACT IN AERS: 2.0000 | INHALATION_SPRAY | Freq: Every day | RESPIRATORY_TRACT | 3 refills | Status: DC

## 2020-08-29 NOTE — Telephone Encounter (Signed)
Spoke with the pt and refilled the rxs for spiriva, symbicort and proair per her request. Nothing further needed.

## 2020-08-31 ENCOUNTER — Ambulatory Visit (HOSPITAL_COMMUNITY): Payer: Medicaid Other | Attending: Student | Admitting: Physical Therapy

## 2020-08-31 ENCOUNTER — Telehealth (HOSPITAL_COMMUNITY): Payer: Self-pay | Admitting: Physical Therapy

## 2020-08-31 NOTE — Telephone Encounter (Signed)
PT called re No show # 1; pt has cancelled 3 visits prior to this.  His medicaid date has expired therapist left message for pt to call us to let us know if he wants [place] to put in for visits again or if he desires to be discharged from PT.   Rayetta Humphrey, [place] CLT [phone]

## 2020-09-02 ENCOUNTER — Emergency Department (HOSPITAL_COMMUNITY)
Admission: EM | Admit: 2020-09-02 | Discharge: 2020-09-02 | Disposition: A | Discharge status: Home / Self Care | Payer: Medicaid Other | Attending: Emergency Medicine | Admitting: Emergency Medicine

## 2020-09-02 ENCOUNTER — Other Ambulatory Visit: Payer: Self-pay

## 2020-09-02 ENCOUNTER — Encounter (HOSPITAL_COMMUNITY): Payer: Self-pay | Admitting: Emergency Medicine

## 2020-09-02 ENCOUNTER — Emergency Department (HOSPITAL_COMMUNITY): Payer: Medicaid Other

## 2020-09-02 DIAGNOSIS — F17210 Nicotine dependence, cigarettes, uncomplicated: Secondary | ICD-10-CM | POA: Insufficient documentation

## 2020-09-02 DIAGNOSIS — Z20822 Contact with and (suspected) exposure to covid-19: Secondary | ICD-10-CM | POA: Insufficient documentation

## 2020-09-02 DIAGNOSIS — Z5900 Homelessness unspecified: Secondary | ICD-10-CM

## 2020-09-02 DIAGNOSIS — R000 Tachycardia, unspecified: Secondary | ICD-10-CM | POA: Diagnosis not present

## 2020-09-02 DIAGNOSIS — J441 Chronic obstructive pulmonary disease with (acute) exacerbation: Secondary | ICD-10-CM | POA: Insufficient documentation

## 2020-09-02 DIAGNOSIS — R059 Cough, unspecified: Secondary | ICD-10-CM | POA: Diagnosis present

## 2020-09-02 HISTORY — DX: Anxiety disorder, unspecified: F41.9

## 2020-09-02 LAB — CBC WITH DIFFERENTIAL/PLATELET
Abs Immature Granulocytes: 0.23 10*3/uL — ABNORMAL HIGH (ref 0.00–0.07)
Basophils Absolute: 0.1 10*3/uL (ref 0.0–0.1)
Basophils Relative: 1 %
Eosinophils Absolute: 0.1 10*3/uL (ref 0.0–0.5)
Eosinophils Relative: 1 %
HCT: 49.1 % (ref 39.0–52.0)
Hemoglobin: 15.8 g/dL (ref 13.0–17.0)
Immature Granulocytes: 1 %
Lymphocytes Relative: 25 %
Lymphs Abs: 4.3 10*3/uL — ABNORMAL HIGH (ref 0.7–4.0)
MCH: 32.4 pg (ref 26.0–34.0)
MCHC: 32.2 g/dL (ref 30.0–36.0)
MCV: 100.8 fL — ABNORMAL HIGH (ref 80.0–100.0)
Monocytes Absolute: 1.4 10*3/uL — ABNORMAL HIGH (ref 0.1–1.0)
Monocytes Relative: 8 %
Neutro Abs: 11.2 10*3/uL — ABNORMAL HIGH (ref 1.7–7.7)
Neutrophils Relative %: 64 %
Platelets: 283 10*3/uL (ref 150–400)
RBC: 4.87 MIL/uL (ref 4.22–5.81)
RDW: 14.2 % (ref 11.5–15.5)
WBC: 17.3 10*3/uL — ABNORMAL HIGH (ref 4.0–10.5)
nRBC: 0 % (ref 0.0–0.2)

## 2020-09-02 LAB — COMPREHENSIVE METABOLIC PANEL
ALT: 29 U/L (ref 0–44)
AST: 29 U/L (ref 15–41)
Albumin: 4.1 g/dL (ref 3.5–5.0)
Alkaline Phosphatase: 73 U/L (ref 38–126)
Anion gap: 11 (ref 5–15)
BUN: 19 mg/dL (ref 6–20)
CO2: 30 mmol/L (ref 22–32)
Calcium: 9.3 mg/dL (ref 8.9–10.3)
Chloride: 99 mmol/L (ref 98–111)
Creatinine, Ser: 1.19 mg/dL (ref 0.61–1.24)
GFR, Estimated: >60 mL/min (ref >60)
Glucose, Bld: 90 mg/dL (ref 70–99)
Potassium: 3.1 mmol/L — ABNORMAL LOW (ref 3.5–5.1)
Sodium: 140 mmol/L (ref 135–145)
Total Bilirubin: 1 mg/dL (ref 0.3–1.2)
Total Protein: 7.2 g/dL (ref 6.5–8.1)

## 2020-09-02 LAB — TROPONIN I (HIGH SENSITIVITY)
Troponin I (High Sensitivity): 31 ng/L — ABNORMAL HIGH (ref <18)
Troponin I (High Sensitivity): 28 ng/L — ABNORMAL HIGH (ref <18)

## 2020-09-02 LAB — RESPIRATORY PANEL BY RT PCR (FLU A&B, COVID)
Influenza A by PCR: NEGATIVE
Influenza B by PCR: NEGATIVE
SARS Coronavirus 2 by RT PCR: NEGATIVE

## 2020-09-02 LAB — MAGNESIUM: Magnesium: 2.3 mg/dL (ref 1.7–2.4)

## 2020-09-02 IMAGING — DX DG CHEST 2V
2 series · 2 of 2 positions shown · non-contrast
Comparison: [DATE]

CLINICAL DATA: Shortness of breath and chest pain for several days.

EXAM:
CHEST - 2 VIEW

[chest lat]
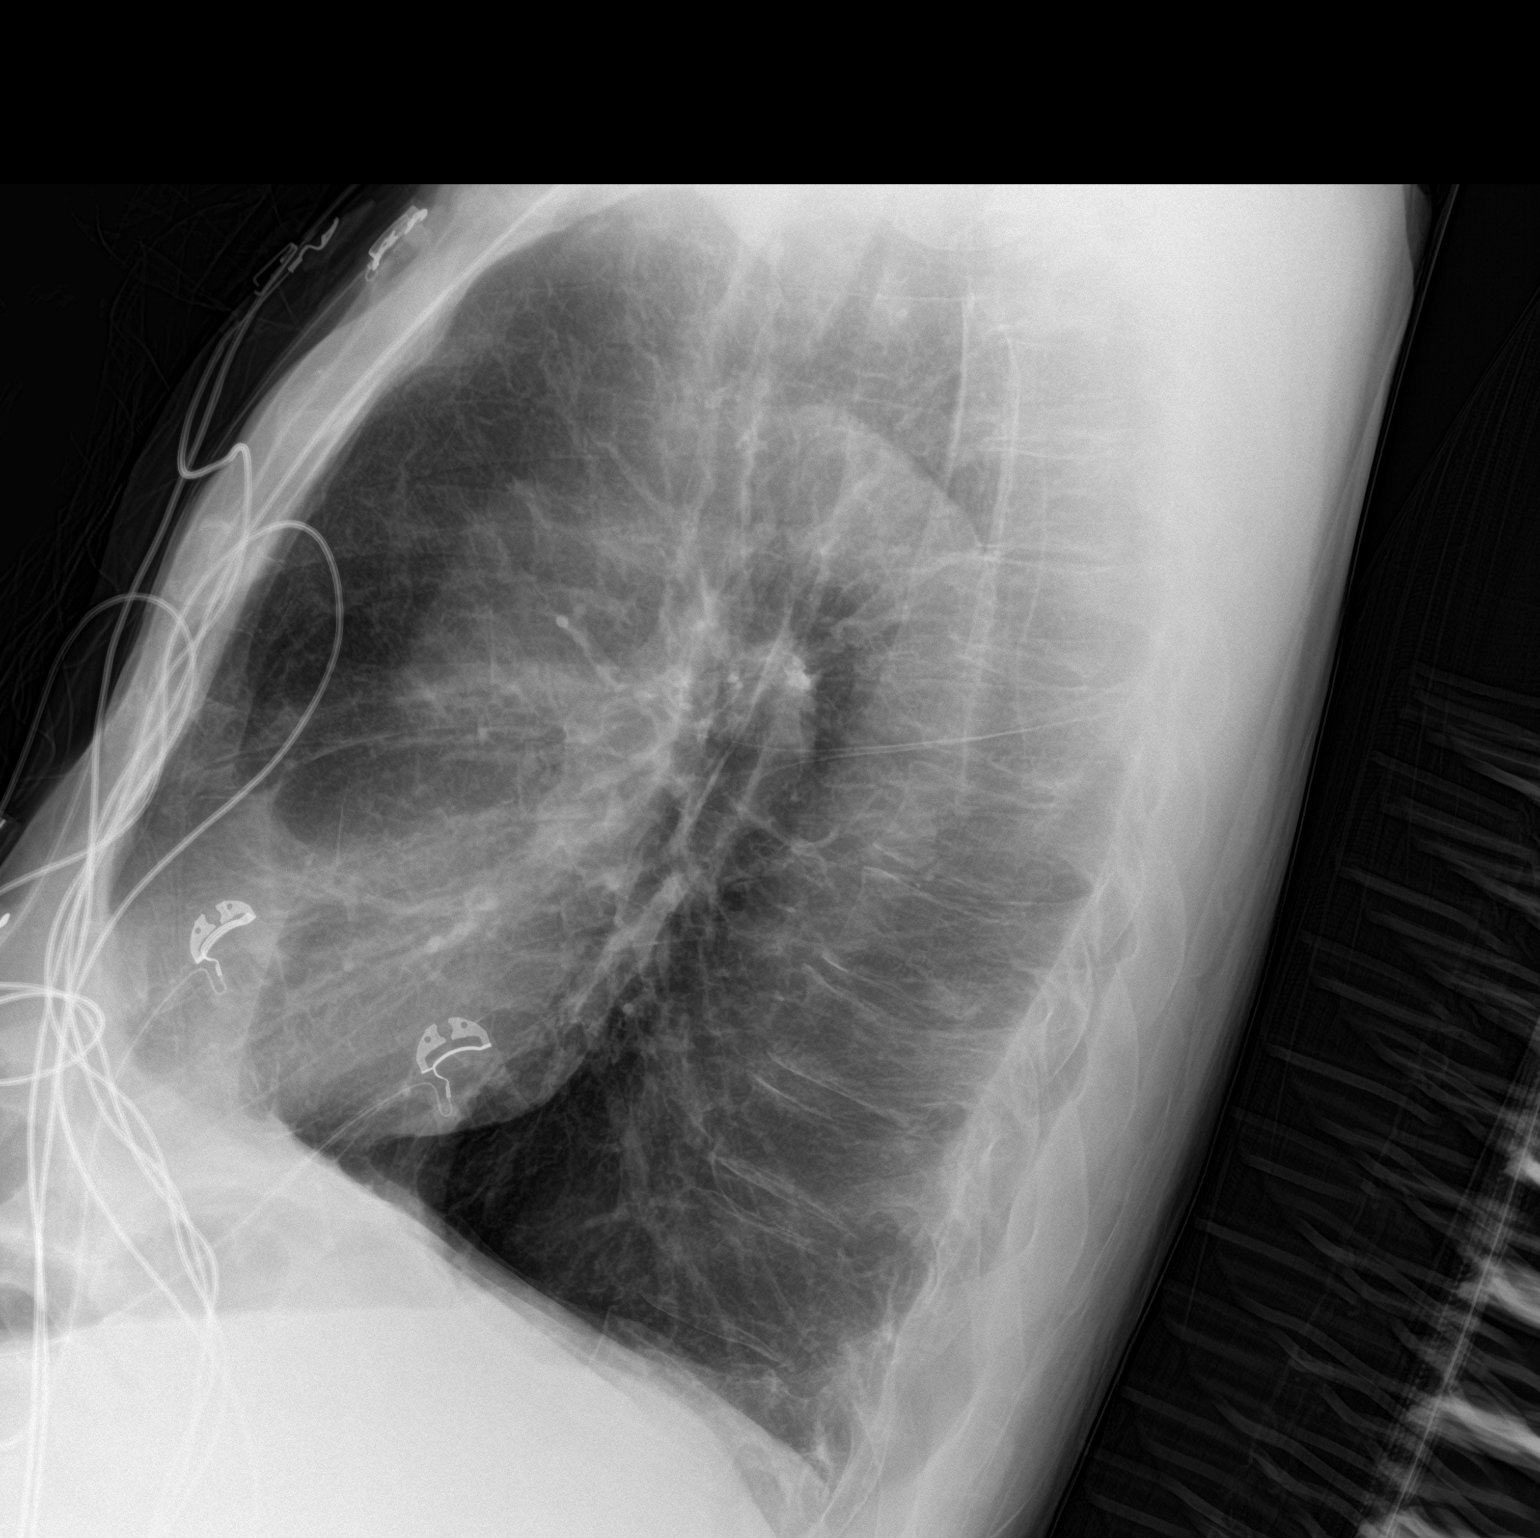

[chest ap]
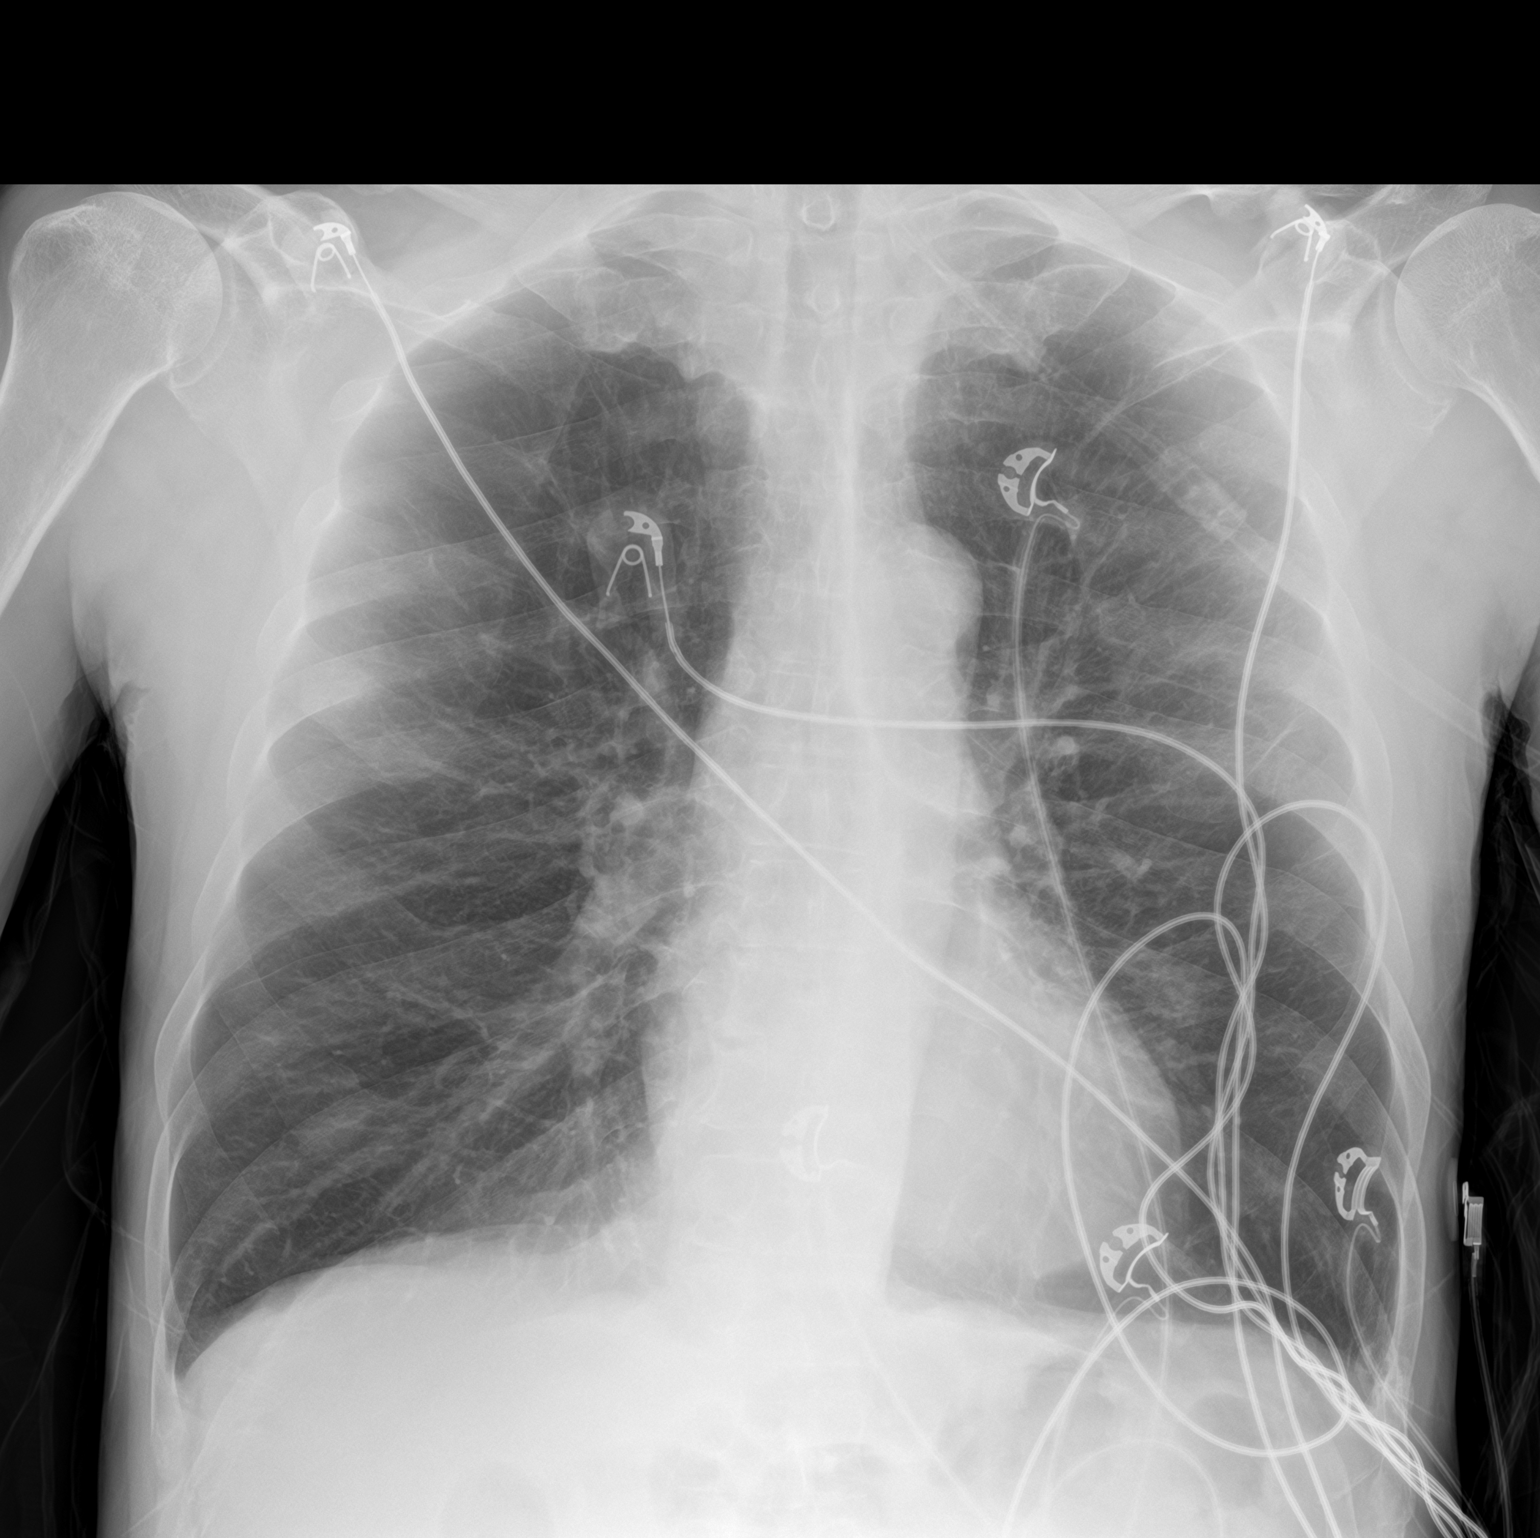

[2 of 2 positions shown; findings below may reference images not displayed]

FINDINGS: Normal heart size. No pleural effusion or edema identified. Lungs
are hyperinflated with chronic interstitial markings of
COPD/emphysema. No superimposed airspace consolidation. The bones
appear diffusely osteopenic. No acute osseous abnormality noted.
IMPRESSION: No acute cardiopulmonary abnormalities.

Emphysema ([I9]-[I9]).

## 2020-09-02 MED ORDER — ALBUTEROL SULFATE HFA 108 (90 BASE) MCG/ACT IN AERS
6.0000 | INHALATION_SPRAY | Freq: Once | RESPIRATORY_TRACT | Status: AC
  Administered 2020-09-02: 6 via RESPIRATORY_TRACT
  Filled 2020-09-02: qty 6.7

## 2020-09-02 MED ORDER — LORATADINE 10 MG PO TABS: 10.0000 mg | ORAL_TABLET | Freq: Every day | ORAL | Status: DC

## 2020-09-02 MED ORDER — ASPIRIN 325 MG PO TABS
325.0000 mg | ORAL_TABLET | Freq: Once | ORAL | Status: AC
  Administered 2020-09-02: 325 mg via ORAL
  Filled 2020-09-02: qty 1

## 2020-09-02 MED ORDER — FLUTICASONE FUROATE-VILANTEROL 200-25 MCG/INH IN AEPB: 1.0000 | INHALATION_SPRAY | Freq: Every day | RESPIRATORY_TRACT | Status: DC

## 2020-09-02 MED ORDER — MIRTAZAPINE 15 MG PO TABS: 30.0000 mg | ORAL_TABLET | Freq: Every day | ORAL | Status: DC

## 2020-09-02 MED ORDER — TIOTROPIUM BROMIDE MONOHYDRATE 2.5 MCG/ACT IN AERS: 2.0000 | INHALATION_SPRAY | Freq: Every day | RESPIRATORY_TRACT | Status: DC

## 2020-09-02 MED ORDER — ACETAMINOPHEN 325 MG PO TABS
650.0000 mg | ORAL_TABLET | Freq: Once | ORAL | Status: AC
  Administered 2020-09-02: 650 mg via ORAL
  Filled 2020-09-02: qty 2

## 2020-09-02 MED ORDER — NITROGLYCERIN 2 % TD OINT
1.0000 [in_us] | TOPICAL_OINTMENT | Freq: Once | TRANSDERMAL | Status: AC
  Administered 2020-09-02: 1 [in_us] via TOPICAL
  Filled 2020-09-02: qty 1

## 2020-09-02 MED ORDER — FLUTICASONE PROPIONATE 50 MCG/ACT NA SUSP
2.0000 | Freq: Two times a day (BID) | NASAL | Status: DC | PRN
  Filled 2020-09-02: qty 16

## 2020-09-02 MED ORDER — LOXAPINE SUCCINATE 10 MG PO CAPS
10.0000 mg | ORAL_CAPSULE | Freq: Every day | ORAL | Status: DC
  Filled 2020-09-02 (×2): qty 1

## 2020-09-02 MED ORDER — RISPERIDONE 1 MG PO TABS: 2.0000 mg | ORAL_TABLET | Freq: Every day | ORAL | Status: DC

## 2020-09-02 MED ORDER — UMECLIDINIUM BROMIDE 62.5 MCG/INH IN AEPB: 1.0000 | INHALATION_SPRAY | Freq: Every day | RESPIRATORY_TRACT | Status: DC

## 2020-09-02 MED ORDER — POTASSIUM CHLORIDE CRYS ER 20 MEQ PO TBCR
40.0000 meq | EXTENDED_RELEASE_TABLET | Freq: Once | ORAL | Status: AC
  Administered 2020-09-02: 40 meq via ORAL
  Filled 2020-09-02: qty 2

## 2020-09-02 MED ORDER — HYDROCHLOROTHIAZIDE 12.5 MG PO CAPS: 12.5000 mg | ORAL_CAPSULE | Freq: Every morning | ORAL | Status: DC

## 2020-09-02 MED ORDER — LOSARTAN POTASSIUM 25 MG PO TABS: 100.0000 mg | ORAL_TABLET | Freq: Every day | ORAL | Status: DC

## 2020-09-02 MED ORDER — AEROCHAMBER Z-STAT PLUS/MEDIUM MISC
1.0000 | Freq: Once | Status: AC
  Administered 2020-09-02: 1

## 2020-09-02 MED ORDER — BENZTROPINE MESYLATE 1 MG PO TABS: 0.5000 mg | ORAL_TABLET | Freq: Every day | ORAL | Status: DC

## 2020-09-02 MED ORDER — POLYETHYLENE GLYCOL 3350 17 G PO PACK: 17.0000 g | PACK | Freq: Every day | ORAL | Status: DC

## 2020-09-02 MED ORDER — ATORVASTATIN CALCIUM 40 MG PO TABS: 40.0000 mg | ORAL_TABLET | Freq: Every day | ORAL | Status: DC

## 2020-09-02 MED ORDER — ALBUTEROL SULFATE HFA 108 (90 BASE) MCG/ACT IN AERS: 2.0000 | INHALATION_SPRAY | RESPIRATORY_TRACT | Status: DC | PRN

## 2020-09-02 MED ORDER — ALBUTEROL SULFATE (2.5 MG/3ML) 0.083% IN NEBU
5.0000 mg | INHALATION_SOLUTION | Freq: Once | RESPIRATORY_TRACT | Status: AC
  Administered 2020-09-02: 5 mg via RESPIRATORY_TRACT
  Filled 2020-09-02: qty 6

## 2020-09-02 MED ORDER — FAMOTIDINE 20 MG PO TABS: 20.0000 mg | ORAL_TABLET | Freq: Every day | ORAL | Status: DC

## 2020-09-02 NOTE — ED Provider Notes (Signed)
[facility] EMERGENCY DEPARTMENT Provider Note   CSN: [id number] Arrival date & time: 09/02/20  0348   Time seen 4:40 AM  History Chief Complaint  Patient presents with  . Shortness of Breath    Tony Sullivan is a 59 y.o. male.  HPI   Patient has a history of COPD.  He states he wears oxygen 2-1/2 L/min 24/7.  He states he has been having trouble breathing over the past week but has gotten worse over the last couple days.  He states he has a chronic cough that has not changed.  He coughs up clear mucus.  He denies fever but has been having wheezing.  He states his chest feels tight but he also has pain in the center of his chest.  He states it is constant but it waxes and wanes.  Nothing makes it worse but laying down makes it feel little better.  He states he is never had the chest pain before.  He denies nausea or vomiting or radiation of the pain.  He states sometimes he gets sweaty when he struggles to breathe.  He states he is still smoking 1 pack/week.  Patient states he used to have an apartment that had 26 steps and he got to the point where he was unable to navigate the steps.  He moved in with someone about 2 months ago and they had some type of disagreement and she kicked him out yesterday.  She still has his concentrator and his nebulizers.  He states she also still with his check this month so he has no money.  He states he had one small tank oxygen that he has been trying to make last over the last 24 hours.  Patient had the [place] vaccine  PCP [name], FNP   Past Medical History:  Diagnosis Date  . Anxiety   . Arthritis   . COPD (chronic obstructive pulmonary disease) ([place]) 2004   diagnosed in 2004, no PFT's to date.  Started on home O2 12/2013, after found to be desatting at PCP's office, and referred to pulmonology.  . Depression   . GERD (gastroesophageal reflux disease)   . High blood pressure   . Prediabetes   . Seasonal allergies   . Tobacco  dependence     Patient Active Problem List   Diagnosis Date Noted  . Hyperlipidemia 05/24/2020  . Hypokalemia 05/24/2020  . Chest pain 05/23/2020  . Bipolar disorder ([place]) 05/23/2020  . HNP (herniated nucleus pulposus), cervical 09/06/2019  . Abdominal pain   . Poor dentition 11/04/2017  . Acute on chronic respiratory failure with hypoxia ([place]) 08/29/2017  . ETOH abuse 03/30/2017  . Cigarette smoker 03/30/2017  . Acute respiratory failure with hypoxia and hypercapnia ([place]) 03/29/2017  . Special screening for malignant neoplasms, colon   . Benign neoplasm of ascending colon   . Benign neoplasm of descending colon   . Hoarseness 08/22/2016  . GERD (gastroesophageal reflux disease) 08/22/2016  . Atypical chest pain 12/05/2015  . Essential hypertension 06/15/2014  . Pectoralis muscle strain 01/11/2014  . COPD with acute exacerbation ([place]) 01/10/2014  . COPD  GOLD III 01/08/2014  . Smoker 01/08/2014  . Cough 01/08/2014  . Chronic respiratory failure with hypoxia ([place]) 01/08/2014    Past Surgical History:  Procedure Laterality Date  . ANTERIOR CERVICAL DECOMP/DISCECTOMY FUSION N/A 09/06/2019   Procedure: Anterior Cervical Discecectomy Fusion - Cerivcal Five-Cervical Six;  Surgeon: Kary Kos, MD;  Location: [facility];  Service: Neurosurgery;  Laterality: N/A;  Anterior Cervical Discecectomy Fusion - Cerivcal Five-Cervical Six  . ANTERIOR CERVICAL DISCECTOMY  09/06/2019  . APPENDECTOMY  1980  . COLONOSCOPY WITH PROPOFOL N/A 12/10/2016   Procedure: COLONOSCOPY WITH PROPOFOL;  Surgeon: [name], MD;  Location: [facility];  Service: Endoscopy;  Laterality: N/A;  . HIP SURGERY Right   . SHOULDER ARTHROSCOPY Right        Family History  Problem Relation Age of Onset  . Emphysema Mother   . Allergies Mother        "everyone in family"  . Asthma Mother        "everyone in family"  . Heart disease Mother   . Clotting disorder Mother   . Cancer Mother   . Emphysema Father   .  Allergies Father   . Allergies Son   . Seizures Brother   . Diabetes Brother     Social History   Tobacco Use  . Smoking status: Current Some Day Smoker    Packs/day: 0.00    Years: 36.00    Pack years: 0.00    Types: Cigarettes  . Smokeless tobacco: Never Used  . Tobacco comment: down to 5-6 cigs a day  Vaping Use  . Vaping Use: Never used  Substance Use Topics  . Alcohol use: Not Currently    Alcohol/week: 14.0 - 21.0 standard drinks    Types: 14 - 21 Standard drinks or equivalent per week    Comment: 2-3 beers/day, with occasional "binges".  No history of withdrawal  . Drug use: Not Currently    Types: Marijuana, Cocaine    Comment: as of November has not used for a month    Home Medications Prior to Admission medications   Medication Sig Start Date End Date Taking? Authorizing Provider  albuterol (PROAIR HFA) 108 (90 Base) MCG/ACT inhaler Inhale 2 puffs into the lungs every 4 (four) hours as needed. 08/29/20   [name], MD  albuterol (PROVENTIL) (2.5 MG/3ML) 0.083% nebulizer solution Take 3 mLs (2.5 mg total) by nebulization every 4 (four) hours as needed for wheezing or shortness of breath. 04/28/18   [name], NP  atorvastatin (LIPITOR) 40 MG tablet TAKE ONE TABLET BY MOUTH DAILY 07/06/20   [name], MD  azithromycin (ZITHROMAX Z-PAK) 250 MG tablet Take 2 tablets today and then 1 tablet daily until gone. 07/24/20   [name], MD  azithromycin (ZITHROMAX) 250 MG tablet Take 1 tablet (250 mg total) by mouth daily. 05/29/20   [name], MD  benztropine (COGENTIN) 0.5 MG tablet Take 0.5 mg by mouth at bedtime.     [provider]  budesonide-formoterol (SYMBICORT) 160-4.5 MCG/ACT inhaler INHALE TWO PUFFS BY MOUTH INTO THE LUNGS TWICE A DAY 08/29/20   [name], MD  cetirizine (ZYRTEC) 10 MG tablet Take 10 mg by mouth daily.    [provider]  famotidine (PEPCID) 20 MG tablet Take 1 tablet (20 mg total) by mouth at bedtime.  06/12/20   [name], MD  fluticasone (FLONASE) 50 MCG/ACT nasal spray Place 2 sprays into both nostrils 3 (three) times daily as needed for allergies.     [provider]  hydrochlorothiazide (MICROZIDE) 12.5 MG capsule TAKE ONE CAPSULE BY MOUTH EVERY MORNING Patient taking differently: Take 12.5 mg by mouth in the morning.  05/16/20   Tolia, Sunit, DO  latanoprost (XALATAN) 0.005 % ophthalmic solution Place 1 drop into both eyes at bedtime. 05/24/19  [provider]  losartan (COZAAR) 100 MG tablet Take 100 mg by mouth daily.    [provider]  loxapine (LOXITANE) 10 MG capsule Take 10 mg by mouth at bedtime.     [provider]  mirtazapine (REMERON) 30 MG tablet Take 30 mg by mouth at bedtime.    [provider]  Multiple Vitamin (MULTIVITAMIN) capsule Take 1 capsule by mouth daily.    [provider]  OXYGEN Inhale 2 L into the lungs as directed. 2-4 liters with sleep and exertion as needed for low oxygen    [provider]  pantoprazole (PROTONIX) 20 MG tablet Take 1 tablet (20 mg total) by mouth daily. 05/30/20   Evalee Jefferson, PA-C  pantoprazole (PROTONIX) 40 MG tablet Take 40 mg by mouth daily.    [provider]  polyethylene glycol (MIRALAX / GLYCOLAX) 17 g packet TAKE 17 GRAMS BY MOUTH DAILY Patient taking differently: Take 17 g by mouth daily.  03/09/20   [name], MD  predniSONE (DELTASONE) 10 MG tablet Take 2 tablets (20 mg total) by mouth daily with breakfast. Patient takes 10mg  every other day, and 20 mg on other days. 06/12/20   [name], NP  risperiDONE (RISPERDAL) 2 MG tablet Take 2 mg by mouth at bedtime.    [provider]  Tiotropium Bromide Monohydrate (SPIRIVA RESPIMAT) 2.5 MCG/ACT AERS Inhale 2 puffs into the lungs daily. 08/29/20   [name], MD    Allergies    Penicillins and Levocetirizine  Review of Systems   Review of Systems  All other systems reviewed and are  negative.   Physical Exam Updated Vital Signs BP 120/87   Pulse 95   Temp 98.1 F (36.7 C) (Oral)   Resp (!) 21   Ht 5\' 11"  (1.803 m)   Wt 70.3 kg   SpO2 93%   BMI 21.62 kg/m   Physical Exam Vitals and nursing note reviewed.  Constitutional:      Appearance: Normal appearance. He is normal weight.  HENT:     Head: Normocephalic and atraumatic.     Right Ear: External ear normal.     Left Ear: External ear normal.  Eyes:     Extraocular Movements: Extraocular movements intact.     Conjunctiva/sclera: Conjunctivae normal.     Pupils: Pupils are equal, round, and reactive to light.  Cardiovascular:     Rate and Rhythm: Regular rhythm. Tachycardia present.  Pulmonary:     Comments: Patient gets short of breath when he talks.  When I listen to his lungs he has diminished breath sounds.  At the time of my exam he had already had a nebulizer 5 mg of albuterol which she states helped. Musculoskeletal:     Cervical back: Normal range of motion.  Skin:    General: Skin is warm and dry.  Neurological:     General: No focal deficit present.     Mental Status: He is alert and oriented to person, place, and time.     Cranial Nerves: No cranial nerve deficit.  Psychiatric:        Mood and Affect: Mood normal.        Behavior: Behavior normal.        Thought Content: Thought content normal.     ED Results / Procedures / Treatments   Labs (all labs ordered are listed, but only abnormal results are displayed) Results for orders placed or performed during the hospital encounter  of 09/02/20  Respiratory Panel by RT PCR (Flu A&B, Covid) - Nasopharyngeal Swab   Specimen: Nasopharyngeal Swab  Result Value Ref Range   SARS Coronavirus 2 by RT PCR NEGATIVE NEGATIVE   Influenza A by PCR NEGATIVE NEGATIVE   Influenza B by PCR NEGATIVE NEGATIVE  Comprehensive metabolic panel  Result Value Ref Range   Sodium 140 135 - 145 mmol/L   Potassium 3.1 (L) 3.5 - 5.1 mmol/L   Chloride 99 98 -  111 mmol/L   CO2 30 22 - 32 mmol/L   Glucose, Bld 90 70 - 99 mg/dL   BUN 19 6 - 20 mg/dL   Creatinine, Ser 1.19 0.61 - 1.24 mg/dL   Calcium 9.3 8.9 - 10.3 mg/dL   Total Protein 7.2 6.5 - 8.1 g/dL   Albumin 4.1 3.5 - 5.0 g/dL   AST 29 15 - 41 U/L   ALT 29 0 - 44 U/L   Alkaline Phosphatase 73 38 - 126 U/L   Total Bilirubin 1.0 0.3 - 1.2 mg/dL   GFR, Estimated >60 >60 mL/min   Anion gap 11 5 - 15  CBC with Differential  Result Value Ref Range   WBC 17.3 (H) 4.0 - 10.5 K/uL   RBC 4.87 4.22 - 5.81 MIL/uL   Hemoglobin 15.8 13.0 - 17.0 g/dL   HCT 49.1 39 - 52 %   MCV 100.8 (H) 80.0 - 100.0 fL   MCH 32.4 26.0 - 34.0 pg   MCHC 32.2 30.0 - 36.0 g/dL   RDW 14.2 11.5 - 15.5 %   Platelets 283 150 - 400 K/uL   nRBC 0.0 0.0 - 0.2 %   Neutrophils Relative % 64 %   Neutro Abs 11.2 (H) 1.7 - 7.7 K/uL   Lymphocytes Relative 25 %   Lymphs Abs 4.3 (H) 0.7 - 4.0 K/uL   Monocytes Relative 8 %   Monocytes Absolute 1.4 (H) 0.1 - 1.0 K/uL   Eosinophils Relative 1 %   Eosinophils Absolute 0.1 0.0 - 0.5 K/uL   Basophils Relative 1 %   Basophils Absolute 0.1 0.0 - 0.1 K/uL   Immature Granulocytes 1 %   Abs Immature Granulocytes 0.23 (H) 0.00 - 0.07 K/uL  Troponin I (High Sensitivity)  Result Value Ref Range   Troponin I (High Sensitivity) 31 (H) <18 ng/L   Laboratory interpretation all normal except elevated troponin, leukocytosis, hypokalemia   EKG EKG Interpretation  Date/Time:  Saturday September 02 2020 04:10:09 EDT Ventricular Rate:  98 PR Interval:    QRS Duration: 77 QT Interval:  349 QTC Calculation: 441 R Axis:   -89 Text Interpretation: Sinus rhythm Multiple premature complexes, vent & supraven Aberrant conduction of SV complex(es) Biatrial enlargement Left anterior fascicular block Anterior infarct, old Nonspecific repol abnormality, lateral leads Baseline wander in lead(s) V4 V5 V6 Confirmed by Rolland Porter [phone]) on 09/02/2020 4:54:04 AM   Radiology DG Chest 2 View  Result  Date: 09/02/2020 CLINICAL DATA:  Shortness of breath and chest pain for several days. EXAM: CHEST - 2 VIEW COMPARISON:  07/18/2020 FINDINGS: Normal heart size. No pleural effusion or edema identified. Lungs are hyperinflated with chronic interstitial markings of COPD/emphysema. No superimposed airspace consolidation. The bones appear diffusely osteopenic. No acute osseous abnormality noted. IMPRESSION: No acute cardiopulmonary abnormalities. Emphysema (ICD10-J43.9). Electronically Signed   By: Kerby Moors M.D.   On: 09/02/2020 05:30    Procedures Procedures (including critical care time)  Medications Ordered in ED Medications  aerochamber Z-Stat Plus/medium 1  each (has no administration in time range)  potassium chloride SA (KLOR-CON) CR tablet 40 mEq (has no administration in time range)  nitroGLYCERIN (NITROGLYN) 2 % ointment 1 inch (has no administration in time range)  aspirin tablet 325 mg (has no administration in time range)  albuterol (PROVENTIL) (2.5 MG/3ML) 0.083% nebulizer solution 5 mg (5 mg Nebulization Given 09/02/20 0415)  albuterol (VENTOLIN HFA) 108 (90 Base) MCG/ACT inhaler 6 puff (6 puffs Inhalation Given 09/02/20 0636)  acetaminophen (TYLENOL) tablet 650 mg (650 mg Oral Given 09/02/20 4818)    ED Course  I have reviewed the triage vital signs and the nursing notes.  Pertinent labs & imaging results that were available during my care of the patient were reviewed by me and considered in my medical decision making (see chart for details).    MDM Rules/Calculators/A&P                         Patient's pulse ox was 100% on 4 L then he was turned down to his usual 3 L and it was 98 to 93%.  Patient was given albuterol 6 puffs by inhaler.  He was given acetaminophen for pain.  He had already had a 5 mg albuterol nebulizer prior to me seeing him.  Laboratory testing was done.  Patient was rechecked at 7:00 AM.  He appears to be in no respiratory distress.  When I listen to him  he has diminished breath sounds but there is no wheezes, rhonchi, or rales.  Patient continues to state he feels like he has central chest pain.  His initial troponin is elevated but however they have been elevated in the past.  However I think it is not without stability that he could have had some ischemia from him having to try to save his oxygen tank since he does not have his concentrator.  Patient then was given aspirin and nitroglycerin  Pt was turned over to Dr Laverta Baltimore at change of shift to get results of his delta troponin and to have his social work consult.      Final Clinical Impression(s) / ED Diagnoses Final diagnoses:  COPD exacerbation [facility])  Homeless    Rx / DC Orders   Position pending  Rolland Porter, MD, Barbette Or, MD 09/02/20 [id number]

## 2020-09-02 NOTE — ED Notes (Signed)
Report given to oncoming nurse.
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
## 2020-09-02 NOTE — TOC Transition Note (Signed)
Transition of Care [facility]) - CM/SW Discharge Note   Patient Details  Name: NESTER BACHUS MRN: [id number] Date of Birth: [birth year]  [facility]) CM/SW Contact:  Natasha Bence, LCSW Phone Number: 09/02/2020, 3:35 PM   Clinical Narrative:    CSW placed order for O2 with [employer] and provided patient with housing referral list. TOC signing off         Patient Goals and CMS Choice        Discharge Placement                       Discharge Plan and Services                                     Social Determinants of Health (SDOH) Interventions     Readmission Risk Interventions Readmission Risk Prevention Plan 09/08/2019  Transportation Screening Complete  PCP or Specialist Appt within 3-5 Days Complete  HRI or [place] Complete  Social Work Consult for [place] Planning/Counseling Complete  Palliative Care Screening Not Applicable  Medication Review [employer]) Complete  Some recent data might be hidden

## 2020-09-02 NOTE — ED Triage Notes (Signed)
Pt with c/o chest pain "for a couple of days" and shortness of breath that started today. Pt also states he ran of of his oxygen "a little bit ago".

## 2020-09-02 NOTE — ED Notes (Signed)
SATURATION QUALIFICATIONS: (This note is used to comply with regulatory documentation for home oxygen)    Patient Saturations on 3 lpm [place] = 92%  Patient Saturations on room air while at rest = 87%  Pt with history of COPD, on RA complains of SOB and increased work of breathing.  Has been on home O2 for some time, sees pulmonology.

## 2020-09-02 NOTE — Discharge Instructions (Addendum)
Follow up as needed

## 2020-09-05 ENCOUNTER — Ambulatory Visit (HOSPITAL_COMMUNITY): Payer: Medicaid Other

## 2020-09-05 ENCOUNTER — Encounter (HOSPITAL_COMMUNITY): Payer: Self-pay

## 2020-09-07 ENCOUNTER — Ambulatory Visit (HOSPITAL_COMMUNITY): Payer: Medicaid Other | Admitting: Physical Therapy

## 2020-09-13 ENCOUNTER — Ambulatory Visit: Payer: Medicaid Other | Admitting: Internal Medicine

## 2020-10-01 ENCOUNTER — Encounter (HOSPITAL_COMMUNITY): Payer: Self-pay | Admitting: Emergency Medicine

## 2020-10-01 ENCOUNTER — Emergency Department (HOSPITAL_COMMUNITY)
Admission: EM | Admit: 2020-10-01 | Discharge: 2020-10-01 | Disposition: A | Discharge status: Home / Self Care | Payer: Medicaid Other | Attending: Emergency Medicine | Admitting: Emergency Medicine

## 2020-10-01 ENCOUNTER — Other Ambulatory Visit: Payer: Self-pay

## 2020-10-01 DIAGNOSIS — J441 Chronic obstructive pulmonary disease with (acute) exacerbation: Secondary | ICD-10-CM | POA: Insufficient documentation

## 2020-10-01 DIAGNOSIS — J40 Bronchitis, not specified as acute or chronic: Secondary | ICD-10-CM | POA: Diagnosis not present

## 2020-10-01 DIAGNOSIS — R0789 Other chest pain: Secondary | ICD-10-CM | POA: Diagnosis present

## 2020-10-01 DIAGNOSIS — F17210 Nicotine dependence, cigarettes, uncomplicated: Secondary | ICD-10-CM | POA: Insufficient documentation

## 2020-10-01 DIAGNOSIS — Z86010 Personal history of colonic polyps: Secondary | ICD-10-CM | POA: Insufficient documentation

## 2020-10-01 DIAGNOSIS — Z7951 Long term (current) use of inhaled steroids: Secondary | ICD-10-CM | POA: Insufficient documentation

## 2020-10-01 LAB — COMPREHENSIVE METABOLIC PANEL
ALT: 24 U/L (ref 0–44)
AST: 23 U/L (ref 15–41)
Albumin: 3.6 g/dL (ref 3.5–5.0)
Alkaline Phosphatase: 67 U/L (ref 38–126)
Anion gap: 10 (ref 5–15)
BUN: 20 mg/dL (ref 6–20)
CO2: 27 mmol/L (ref 22–32)
Calcium: 8.5 mg/dL — ABNORMAL LOW (ref 8.9–10.3)
Chloride: 105 mmol/L (ref 98–111)
Creatinine, Ser: 1.22 mg/dL (ref 0.61–1.24)
GFR, Estimated: >60 mL/min (ref >60)
Glucose, Bld: 92 mg/dL (ref 70–99)
Potassium: 3.2 mmol/L — ABNORMAL LOW (ref 3.5–5.1)
Sodium: 142 mmol/L (ref 135–145)
Total Bilirubin: 1.1 mg/dL (ref 0.3–1.2)
Total Protein: 6.6 g/dL (ref 6.5–8.1)

## 2020-10-01 LAB — CBC WITH DIFFERENTIAL/PLATELET
Abs Immature Granulocytes: 0.09 10*3/uL — ABNORMAL HIGH (ref 0.00–0.07)
Basophils Absolute: 0.1 10*3/uL (ref 0.0–0.1)
Basophils Relative: 1 %
Eosinophils Absolute: 0.1 10*3/uL (ref 0.0–0.5)
Eosinophils Relative: 1 %
HCT: 51.4 % (ref 39.0–52.0)
Hemoglobin: 16.1 g/dL (ref 13.0–17.0)
Immature Granulocytes: 1 %
Lymphocytes Relative: 20 %
Lymphs Abs: 2.4 10*3/uL (ref 0.7–4.0)
MCH: 32.3 pg (ref 26.0–34.0)
MCHC: 31.3 g/dL (ref 30.0–36.0)
MCV: 103.2 fL — ABNORMAL HIGH (ref 80.0–100.0)
Monocytes Absolute: 1.1 10*3/uL — ABNORMAL HIGH (ref 0.1–1.0)
Monocytes Relative: 9 %
Neutro Abs: 8.2 10*3/uL — ABNORMAL HIGH (ref 1.7–7.7)
Neutrophils Relative %: 68 %
Platelets: 191 10*3/uL (ref 150–400)
RBC: 4.98 MIL/uL (ref 4.22–5.81)
RDW: 14.8 % (ref 11.5–15.5)
WBC: 11.9 10*3/uL — ABNORMAL HIGH (ref 4.0–10.5)
nRBC: 0 % (ref 0.0–0.2)

## 2020-10-01 LAB — TROPONIN I (HIGH SENSITIVITY): Troponin I (High Sensitivity): 15 ng/L (ref <18)

## 2020-10-01 LAB — BRAIN NATRIURETIC PEPTIDE: B Natriuretic Peptide: 80 pg/mL (ref 0.0–100.0)

## 2020-10-01 MED ORDER — ALBUTEROL SULFATE (2.5 MG/3ML) 0.083% IN NEBU: 5.0000 mg | INHALATION_SOLUTION | Freq: Once | RESPIRATORY_TRACT | Status: DC

## 2020-10-01 MED ORDER — IPRATROPIUM BROMIDE 0.02 % IN SOLN: 0.5000 mg | Freq: Once | RESPIRATORY_TRACT | Status: DC

## 2020-10-01 MED ORDER — ALBUTEROL SULFATE (2.5 MG/3ML) 0.083% IN NEBU
INHALATION_SOLUTION | RESPIRATORY_TRACT | Status: AC
  Administered 2020-10-01: 2.5 mg via RESPIRATORY_TRACT
  Filled 2020-10-01: qty 3

## 2020-10-01 MED ORDER — IPRATROPIUM-ALBUTEROL 0.5-2.5 (3) MG/3ML IN SOLN
3.0000 mL | Freq: Once | RESPIRATORY_TRACT | Status: AC
  Administered 2020-10-01: 3 mL via RESPIRATORY_TRACT
  Filled 2020-10-01: qty 3

## 2020-10-01 MED ORDER — ALBUTEROL SULFATE (2.5 MG/3ML) 0.083% IN NEBU: 2.5000 mg | INHALATION_SOLUTION | Freq: Once | RESPIRATORY_TRACT | Status: AC

## 2020-10-01 MED ORDER — DOXYCYCLINE HYCLATE 100 MG PO CAPS: 100.0000 mg | ORAL_CAPSULE | Freq: Two times a day (BID) | ORAL | 0 refills | Status: DC

## 2020-10-01 NOTE — ED Triage Notes (Signed)
Pt c/o chest pain for a couple days ago. States he was out of his meds for the past week.

## 2020-10-01 NOTE — ED Provider Notes (Signed)
[facility] EMERGENCY DEPARTMENT Provider Note   CSN: [id number] Arrival date & time: 10/01/20  0458   Time seen 6:09 AM  History Chief Complaint  Patient presents with  . Chest Pain    Tony Sullivan is a 59 y.o. male.  HPI   Patient states he started having chest pain 2 days ago. The pain has been constant and is located in the center of his chest that he describes as tightness. He states it hurts more with breathing. He states he is chronically short of breath but it seems worse the past couple days. He complains of wheezing. He is having a cough with some green mucus but denies rhinorrhea, sore throat or fever. He also denies nausea, vomiting, or diarrhea. Patient states he ran out of oxygen and his small tank coming to the ED however he can refill it once he gets home. He also states he ran out of all of his medications earlier in the week however the male did deliver it yesterday, December 4. He is chronically on oxygen 3 L/min nasal cannula.  PCP [name], FNP   Past Medical History:  Diagnosis Date  . Anxiety   . Arthritis   . COPD (chronic obstructive pulmonary disease) ([place]) 2004   diagnosed in 2004, no PFT's to date.  Started on home O2 12/2013, after found to be desatting at PCP's office, and referred to pulmonology.  . Depression   . GERD (gastroesophageal reflux disease)   . High blood pressure   . Prediabetes   . Seasonal allergies   . Tobacco dependence     Patient Active Problem List   Diagnosis Date Noted  . Hyperlipidemia 05/24/2020  . Hypokalemia 05/24/2020  . Chest pain 05/23/2020  . Bipolar disorder ([place]) 05/23/2020  . HNP (herniated nucleus pulposus), cervical 09/06/2019  . Abdominal pain   . Poor dentition 11/04/2017  . Acute on chronic respiratory failure with hypoxia ([place]) 08/29/2017  . ETOH abuse 03/30/2017  . Cigarette smoker 03/30/2017  . Acute respiratory failure with hypoxia and hypercapnia ([place]) 03/29/2017  . Special screening  for malignant neoplasms, colon   . Benign neoplasm of ascending colon   . Benign neoplasm of descending colon   . Hoarseness 08/22/2016  . GERD (gastroesophageal reflux disease) 08/22/2016  . Atypical chest pain 12/05/2015  . Essential hypertension 06/15/2014  . Pectoralis muscle strain 01/11/2014  . COPD with acute exacerbation ([place]) 01/10/2014  . COPD  GOLD III 01/08/2014  . Smoker 01/08/2014  . Cough 01/08/2014  . Chronic respiratory failure with hypoxia ([place]) 01/08/2014    Past Surgical History:  Procedure Laterality Date  . ANTERIOR CERVICAL DECOMP/DISCECTOMY FUSION N/A 09/06/2019   Procedure: Anterior Cervical Discecectomy Fusion - Cerivcal Five-Cervical Six;  Surgeon: Kary Kos, MD;  Location: [facility];  Service: Neurosurgery;  Laterality: N/A;  Anterior Cervical Discecectomy Fusion - Cerivcal Five-Cervical Six  . ANTERIOR CERVICAL DISCECTOMY  09/06/2019  . APPENDECTOMY  1980  . COLONOSCOPY WITH PROPOFOL N/A 12/10/2016   Procedure: COLONOSCOPY WITH PROPOFOL;  Surgeon: [name], MD;  Location: [facility];  Service: Endoscopy;  Laterality: N/A;  . HIP SURGERY Right   . SHOULDER ARTHROSCOPY Right        Family History  Problem Relation Age of Onset  . Emphysema Mother   . Allergies Mother        "everyone in family"  . Asthma Mother        "everyone in family"  . Heart disease Mother   .  Clotting disorder Mother   . Cancer Mother   . Emphysema Father   . Allergies Father   . Allergies Son   . Seizures Brother   . Diabetes Brother     Social History   Tobacco Use  . Smoking status: Current Some Day Smoker    Packs/day: 0.00    Years: 36.00    Pack years: 0.00    Types: Cigarettes  . Smokeless tobacco: Never Used  . Tobacco comment: down to 5-6 cigs a day  Vaping Use  . Vaping Use: Never used  Substance Use Topics  . Alcohol use: Not Currently    Alcohol/week: 14.0 - 21.0 standard drinks    Types: 14 - 21 Standard drinks or equivalent per week     Comment: 2-3 beers/day, with occasional "binges".  No history of withdrawal  . Drug use: Not Currently    Types: Marijuana, Cocaine    Comment: as of November has not used for a month    Home Medications Prior to Admission medications   Medication Sig Start Date End Date Taking? Authorizing Provider  albuterol (PROAIR HFA) 108 (90 Base) MCG/ACT inhaler Inhale 2 puffs into the lungs every 4 (four) hours as needed. 08/29/20   [name], MD  albuterol (PROVENTIL) (2.5 MG/3ML) 0.083% nebulizer solution Take 3 mLs (2.5 mg total) by nebulization every 4 (four) hours as needed for wheezing or shortness of breath. 04/28/18   [name], NP  atorvastatin (LIPITOR) 40 MG tablet TAKE ONE TABLET BY MOUTH DAILY 07/06/20   [name], MD  benztropine (COGENTIN) 0.5 MG tablet Take 0.5 mg by mouth at bedtime.     [provider]  budesonide-formoterol (SYMBICORT) 160-4.5 MCG/ACT inhaler INHALE TWO PUFFS BY MOUTH INTO THE LUNGS TWICE A DAY 08/29/20   [name], MD  cetirizine (ZYRTEC) 10 MG tablet Take 10 mg by mouth daily.    [provider]  doxycycline (VIBRAMYCIN) 100 MG capsule Take 1 capsule (100 mg total) by mouth 2 (two) times daily. 10/01/20   Rolland Porter, MD  famotidine (PEPCID) 20 MG tablet Take 1 tablet (20 mg total) by mouth at bedtime. 06/12/20   [name], MD  fluticasone (FLONASE) 50 MCG/ACT nasal spray Place 2 sprays into both nostrils 3 (three) times daily as needed for allergies.     [provider]  hydrochlorothiazide (MICROZIDE) 12.5 MG capsule TAKE ONE CAPSULE BY MOUTH EVERY MORNING Patient taking differently: Take 12.5 mg by mouth in the morning.  05/16/20   Tolia, Sunit, DO  latanoprost (XALATAN) 0.005 % ophthalmic solution Place 1 drop into both eyes at bedtime. 05/24/19   [provider]  losartan (COZAAR) 100 MG tablet Take 100 mg by mouth daily.    [provider]  loxapine (LOXITANE) 10 MG capsule Take 10 mg by mouth  at bedtime.     [provider]  mirtazapine (REMERON) 30 MG tablet Take 30 mg by mouth at bedtime.    [provider]  Multiple Vitamin (MULTIVITAMIN) capsule Take 1 capsule by mouth daily.    [provider]  OXYGEN Inhale 2 L into the lungs as directed. 2-4 liters with sleep and exertion as needed for low oxygen    [provider]  pantoprazole (PROTONIX) 40 MG tablet Take 40 mg by mouth daily.    [provider]  polyethylene glycol (MIRALAX / GLYCOLAX) 17 g packet TAKE 17 GRAMS BY MOUTH DAILY Patient taking differently: Take 17  g by mouth daily.  03/09/20   [name], MD  predniSONE (DELTASONE) 10 MG tablet Take 2 tablets (20 mg total) by mouth daily with breakfast. Patient takes 10mg  every other day, and 20 mg on other days. 06/12/20   [name], NP  risperiDONE (RISPERDAL) 2 MG tablet Take 2 mg by mouth at bedtime.    [provider]  Tiotropium Bromide Monohydrate (SPIRIVA RESPIMAT) 2.5 MCG/ACT AERS Inhale 2 puffs into the lungs daily. 08/29/20   [name], MD    Allergies    Penicillins and Levocetirizine  Review of Systems   Review of Systems  All other systems reviewed and are negative.   Physical Exam Updated Vital Signs BP (!) 123/91   Pulse 81   Resp 15   Ht 5\' 11"  (1.803 m)   Wt 70.3 kg   SpO2 99%   BMI 21.62 kg/m   Physical Exam Vitals and nursing note reviewed.  Constitutional:      Appearance: Normal appearance. He is normal weight.  HENT:     Head: Normocephalic and atraumatic.     Right Ear: External ear normal.  Eyes:     Extraocular Movements: Extraocular movements intact.     Conjunctiva/sclera: Conjunctivae normal.  Cardiovascular:     Rate and Rhythm: Regular rhythm. Tachycardia present.  Pulmonary:     Effort: Tachypnea, accessory muscle usage and prolonged expiration present.     Breath sounds: Decreased breath sounds present.  Chest:       Comments: Patient has some chest  wall tenderness to palpation Musculoskeletal:     Cervical back: Normal range of motion and neck supple.     Comments: Patient has mild edema around his ankles  Skin:    General: Skin is warm and dry.  Neurological:     General: No focal deficit present.     Mental Status: He is alert and oriented to person, place, and time.     Cranial Nerves: No cranial nerve deficit.  Psychiatric:        Mood and Affect: Mood normal.        Behavior: Behavior normal.        Thought Content: Thought content normal.     ED Results / Procedures / Treatments   Labs (all labs ordered are listed, but only abnormal results are displayed) Results for orders placed or performed during the hospital encounter of 10/01/20  Comprehensive metabolic panel  Result Value Ref Range   Sodium 142 135 - 145 mmol/L   Potassium 3.2 (L) 3.5 - 5.1 mmol/L   Chloride 105 98 - 111 mmol/L   CO2 27 22 - 32 mmol/L   Glucose, Bld 92 70 - 99 mg/dL   BUN 20 6 - 20 mg/dL   Creatinine, Ser 1.22 0.61 - 1.24 mg/dL   Calcium 8.5 (L) 8.9 - 10.3 mg/dL   Total Protein 6.6 6.5 - 8.1 g/dL   Albumin 3.6 3.5 - 5.0 g/dL   AST 23 15 - 41 U/L   ALT 24 0 - 44 U/L   Alkaline Phosphatase 67 38 - 126 U/L   Total Bilirubin 1.1 0.3 - 1.2 mg/dL   GFR, Estimated >60 >60 mL/min   Anion gap 10 5 - 15  CBC with Differential  Result Value Ref Range   WBC 11.9 (H) 4.0 - 10.5 K/uL   RBC 4.98 4.22 - 5.81 MIL/uL   Hemoglobin 16.1 13.0 - 17.0 g/dL   HCT 51.4 39 -  52 %   MCV 103.2 (H) 80.0 - 100.0 fL   MCH 32.3 26.0 - 34.0 pg   MCHC 31.3 30.0 - 36.0 g/dL   RDW 14.8 11.5 - 15.5 %   Platelets 191 150 - 400 K/uL   nRBC 0.0 0.0 - 0.2 %   Neutrophils Relative % 68 %   Neutro Abs 8.2 (H) 1.7 - 7.7 K/uL   Lymphocytes Relative 20 %   Lymphs Abs 2.4 0.7 - 4.0 K/uL   Monocytes Relative 9 %   Monocytes Absolute 1.1 (H) 0.1 - 1.0 K/uL   Eosinophils Relative 1 %   Eosinophils Absolute 0.1 0.0 - 0.5 K/uL   Basophils Relative 1 %   Basophils Absolute  0.1 0.0 - 0.1 K/uL   Immature Granulocytes 1 %   Abs Immature Granulocytes 0.09 (H) 0.00 - 0.07 K/uL  Brain natriuretic peptide  Result Value Ref Range   B Natriuretic Peptide 80.0 0.0 - 100.0 pg/mL  Troponin I (High Sensitivity)  Result Value Ref Range   Troponin I (High Sensitivity) 15 <18 ng/L   Laboratory interpretation all normal except mild hypokalemia most likely from his albuterol nebulizers and inhalers    EKG EKG Interpretation  Date/Time:  "Sunday October 01 2020 05:10:50 EST Ventricular Rate:  99 PR Interval:    QRS Duration: 71 QT Interval:  355 QTC Calculation: 456 R Axis:   -91 Text Interpretation: Sinus tachycardia Left anterior fascicular block Nonspecific T abnormalities, lateral leads Baseline wander No significant change since last tracing 02 Sep 2020 Confirmed by ,  ([id number]) on 10/01/2020 5:52:19 AM   Radiology No results found.  Procedures Procedures (including critical care time)  Medications Ordered in ED Medications  albuterol (PROVENTIL) (2.5 MG/3ML) 0.083% nebulizer solution 5 mg (has no administration in time range)  ipratropium (ATROVENT) nebulizer solution 0.5 mg (has no administration in time range)    ED Course  I have reviewed the triage vital signs and the nursing notes.  Pertinent labs & imaging results that were available during my care of the patient were reviewed by me and considered in my medical decision making (see chart for details).    MDM Rules/Calculators/A&P                          Patient stated he felt like he needed a breathing treatment.  He was given albuterol and Atrovent.  7:14 AM patient's first troponin has come back and is normal.  He states he has had the pain constantly for 2 days so a delta will not be needed.  Patient has had a Myoview stress test done in July which showed no diagnostic ST segment changes to indicate ischemia.  It was felt to be a low risk study.  His ejection fraction was estimated  to be 61%.  Recheck at 7:25 AM patient still waiting to get his nebulizer.  We discussed his test results.  Also do not feel like he needs to get a delta troponin.  He is agreeable to being discharged home after his nebulizer.  Final Clinical Impression(s) / ED Diagnoses Final diagnoses:  Atypical chest pain  Chest wall pain  Bronchitis    Rx / DC Orders ED Discharge Orders         Ordered    doxycycline (VIBRAMYCIN) 100 MG capsule  2 times daily        12" /05/21 0731          Plan discharge  Rolland Porter, MD, Barbette Or, MD 10/01/20 [phone]

## 2020-10-01 NOTE — Discharge Instructions (Addendum)
Use ice and heat on your chest for comfort.  Take Tylenol for your chest wall pain.  Continue your inhalers and nebulizers as needed.

## 2020-10-02 ENCOUNTER — Encounter (HOSPITAL_COMMUNITY): Payer: Self-pay

## 2020-10-02 ENCOUNTER — Other Ambulatory Visit: Payer: Self-pay

## 2020-10-02 ENCOUNTER — Telehealth: Payer: Self-pay | Admitting: Internal Medicine

## 2020-10-02 ENCOUNTER — Emergency Department (HOSPITAL_COMMUNITY)
Admission: EM | Admit: 2020-10-02 | Discharge: 2020-10-09 | Disposition: A | Discharge status: Home / Self Care | Payer: Medicaid Other | Attending: Emergency Medicine | Admitting: Emergency Medicine

## 2020-10-02 ENCOUNTER — Emergency Department (HOSPITAL_COMMUNITY): Payer: Medicaid Other

## 2020-10-02 DIAGNOSIS — J449 Chronic obstructive pulmonary disease, unspecified: Secondary | ICD-10-CM | POA: Insufficient documentation

## 2020-10-02 DIAGNOSIS — R202 Paresthesia of skin: Secondary | ICD-10-CM | POA: Insufficient documentation

## 2020-10-02 DIAGNOSIS — R079 Chest pain, unspecified: Secondary | ICD-10-CM

## 2020-10-02 DIAGNOSIS — R2243 Localized swelling, mass and lump, lower limb, bilateral: Secondary | ICD-10-CM | POA: Diagnosis not present

## 2020-10-02 DIAGNOSIS — F17210 Nicotine dependence, cigarettes, uncomplicated: Secondary | ICD-10-CM | POA: Insufficient documentation

## 2020-10-02 DIAGNOSIS — Z85038 Personal history of other malignant neoplasm of large intestine: Secondary | ICD-10-CM | POA: Insufficient documentation

## 2020-10-02 DIAGNOSIS — Z79899 Other long term (current) drug therapy: Secondary | ICD-10-CM | POA: Insufficient documentation

## 2020-10-02 DIAGNOSIS — R0609 Other forms of dyspnea: Secondary | ICD-10-CM

## 2020-10-02 DIAGNOSIS — Z7951 Long term (current) use of inhaled steroids: Secondary | ICD-10-CM | POA: Insufficient documentation

## 2020-10-02 DIAGNOSIS — I10 Essential (primary) hypertension: Secondary | ICD-10-CM | POA: Insufficient documentation

## 2020-10-02 DIAGNOSIS — R0789 Other chest pain: Secondary | ICD-10-CM | POA: Diagnosis not present

## 2020-10-02 DIAGNOSIS — R531 Weakness: Secondary | ICD-10-CM

## 2020-10-02 LAB — CBC
HCT: 47 % (ref 39.0–52.0)
Hemoglobin: 14.9 g/dL (ref 13.0–17.0)
MCH: 32 pg (ref 26.0–34.0)
MCHC: 31.7 g/dL (ref 30.0–36.0)
MCV: 101.1 fL — ABNORMAL HIGH (ref 80.0–100.0)
Platelets: 258 10*3/uL (ref 150–400)
RBC: 4.65 MIL/uL (ref 4.22–5.81)
RDW: 14.6 % (ref 11.5–15.5)
WBC: 18.1 10*3/uL — ABNORMAL HIGH (ref 4.0–10.5)
nRBC: 0 % (ref 0.0–0.2)

## 2020-10-02 LAB — BASIC METABOLIC PANEL
Anion gap: 10 (ref 5–15)
BUN: 11 mg/dL (ref 6–20)
CO2: 26 mmol/L (ref 22–32)
Calcium: 8.8 mg/dL — ABNORMAL LOW (ref 8.9–10.3)
Chloride: 103 mmol/L (ref 98–111)
Creatinine, Ser: 1.18 mg/dL (ref 0.61–1.24)
GFR, Estimated: >60 mL/min (ref >60)
Glucose, Bld: 113 mg/dL — ABNORMAL HIGH (ref 70–99)
Potassium: 3.2 mmol/L — ABNORMAL LOW (ref 3.5–5.1)
Sodium: 139 mmol/L (ref 135–145)

## 2020-10-02 LAB — BRAIN NATRIURETIC PEPTIDE: B Natriuretic Peptide: 204 pg/mL — ABNORMAL HIGH (ref 0.0–100.0)

## 2020-10-02 LAB — D-DIMER, QUANTITATIVE: D-Dimer, Quant: 0.56 ug/mL-FEU — ABNORMAL HIGH (ref 0.00–0.50)

## 2020-10-02 LAB — TROPONIN I (HIGH SENSITIVITY)
Troponin I (High Sensitivity): 12 ng/L (ref <18)
Troponin I (High Sensitivity): 12 ng/L (ref <18)

## 2020-10-02 IMAGING — DX DG CHEST 1V
1 series · 2 of 2 positions shown · non-contrast
Comparison: [DATE].

CLINICAL DATA: Chest pain.

EXAM:
CHEST  1 VIEW

[Series 3: chest ap · 0.14mm/px · 2 of 2 slices shown]
[im 1/2]
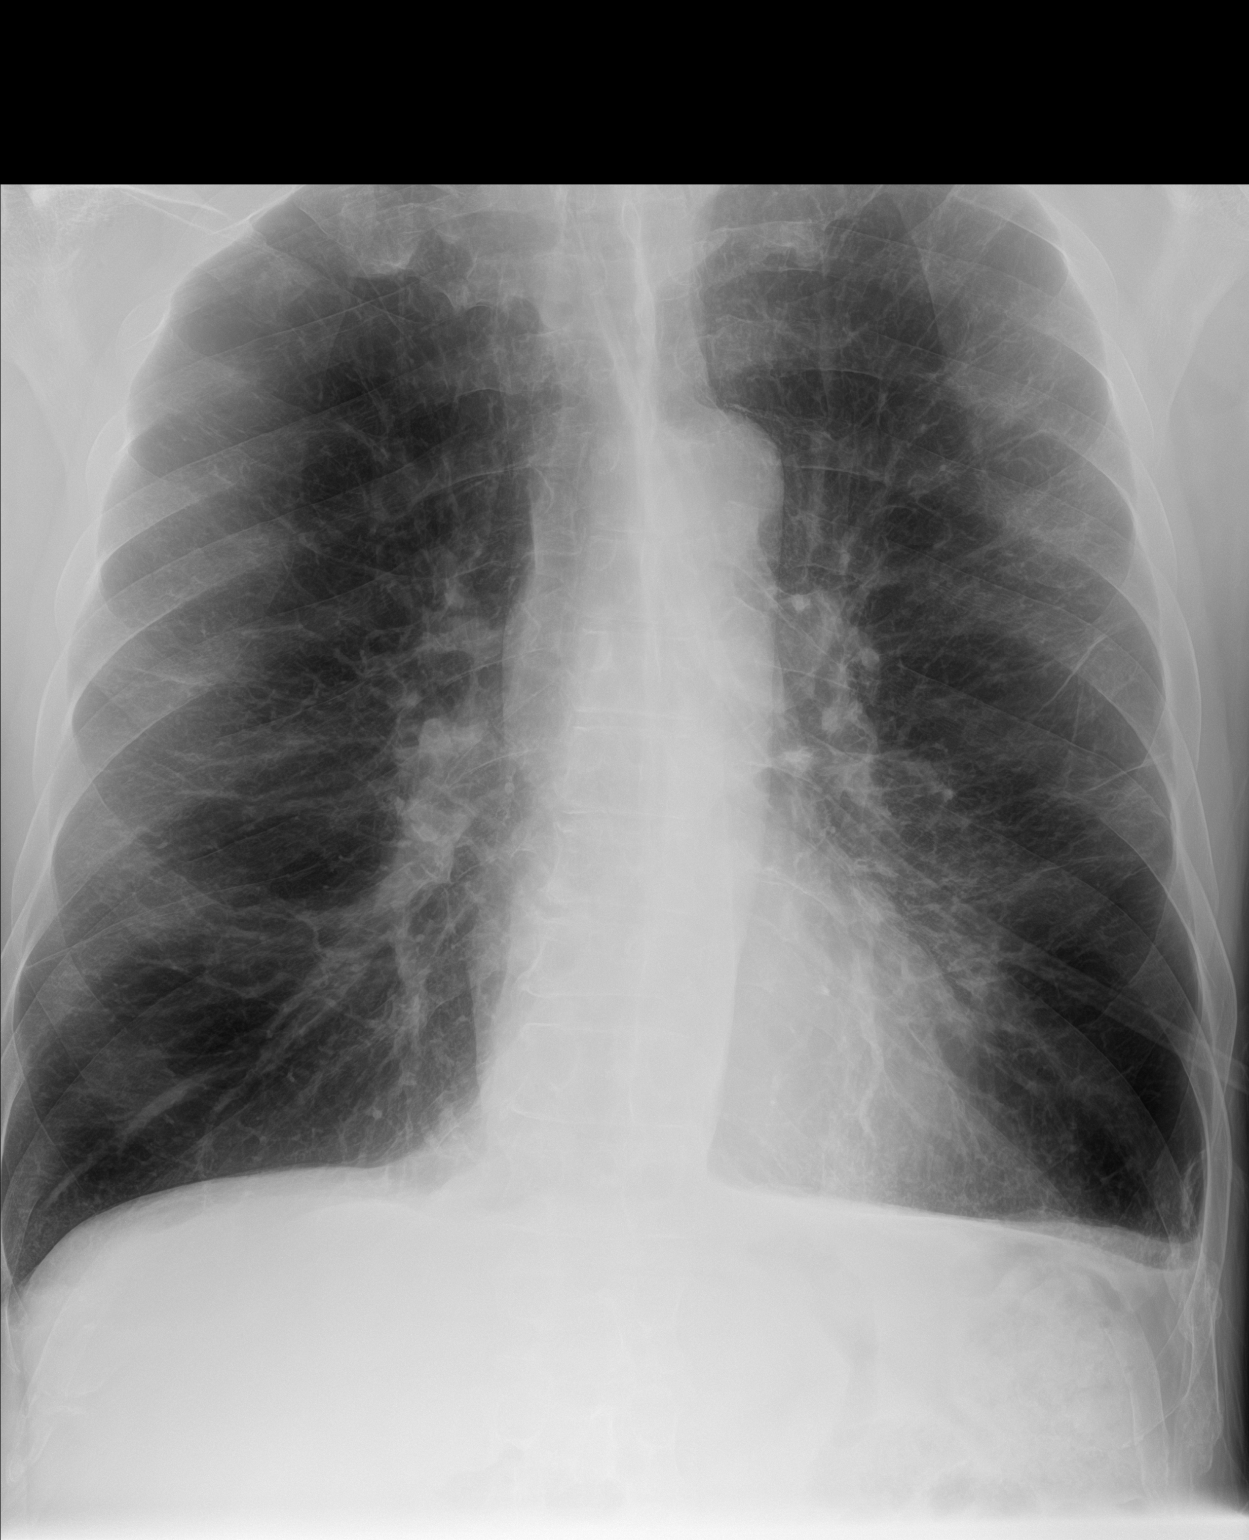
[im 2/2]
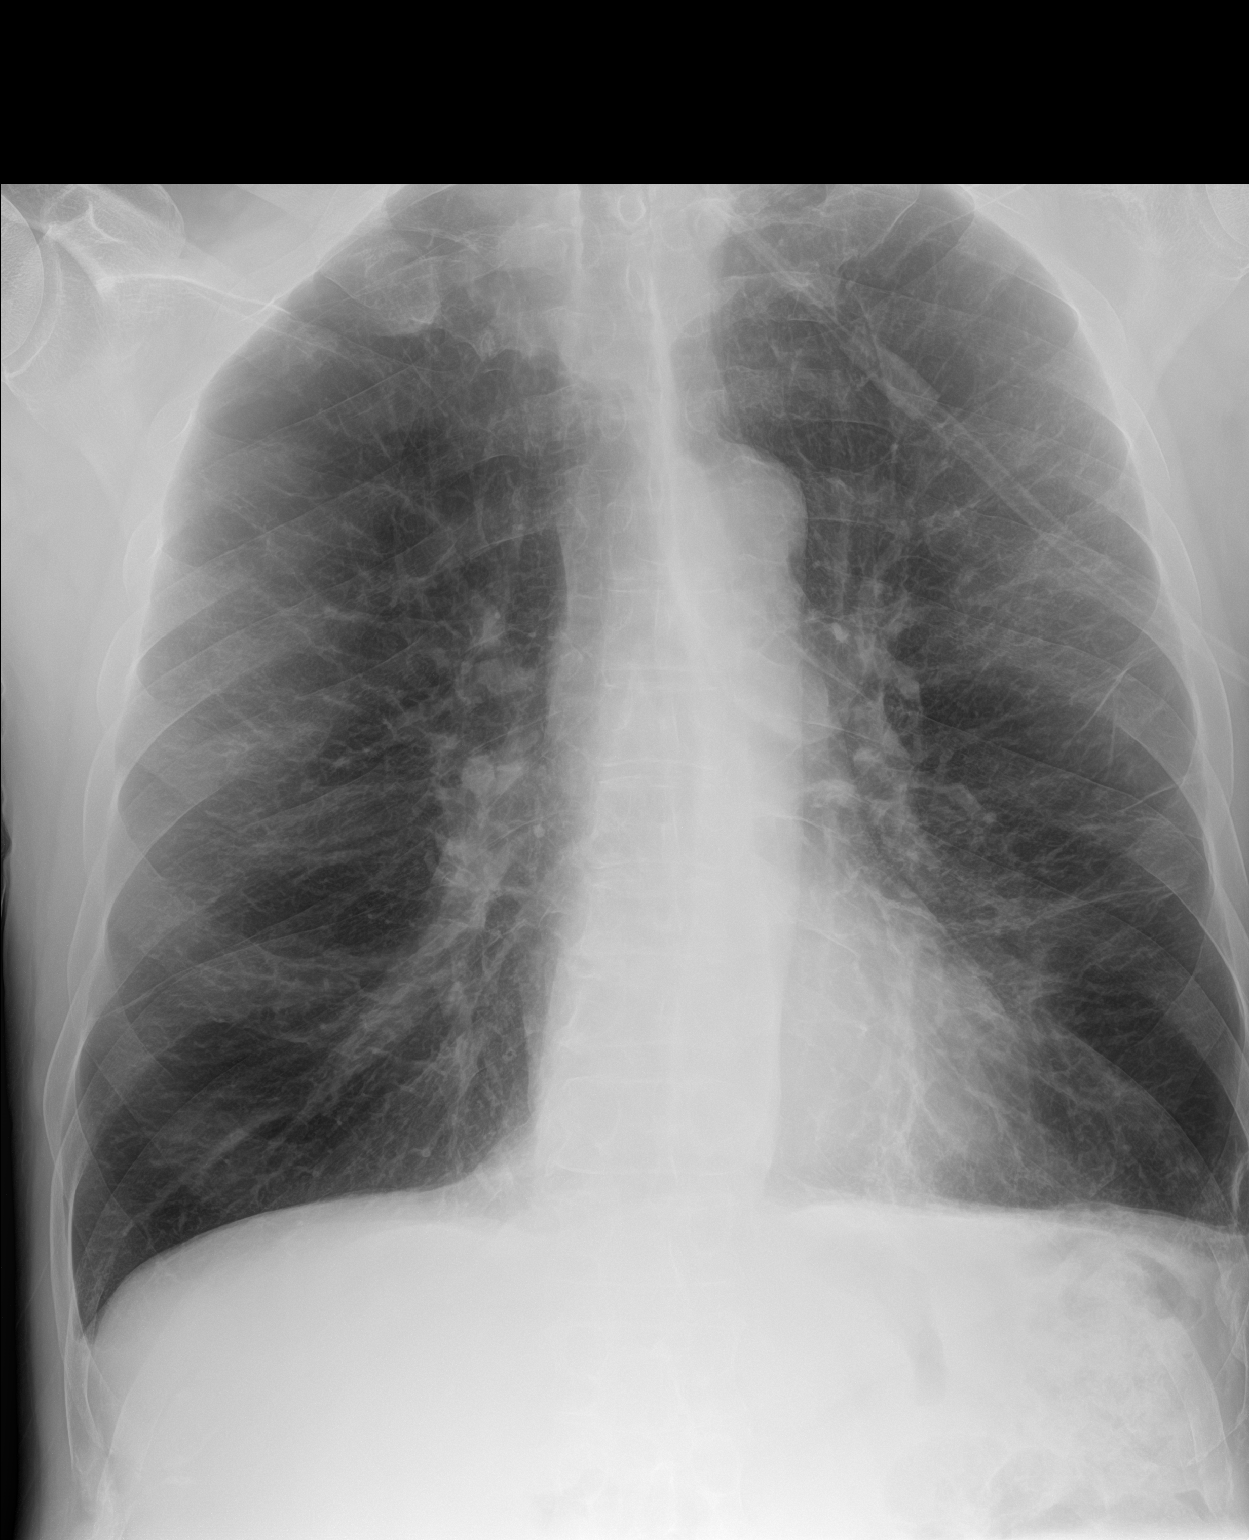

[2 of 2 positions shown; findings below may reference images not displayed]

FINDINGS: The heart size and mediastinal contours are within normal limits. No
pneumothorax or pleural effusion is noted. Emphysematous disease is
again noted throughout both lungs. No acute pulmonary disease is
noted. The visualized skeletal structures are unremarkable.
IMPRESSION: No active disease.

Emphysema ([DQ]-[DQ]).

## 2020-10-02 MED ORDER — MIRTAZAPINE 15 MG PO TABS
30.0000 mg | ORAL_TABLET | Freq: Every day | ORAL | Status: DC
  Filled 2020-10-02 (×5): qty 2

## 2020-10-02 MED ORDER — DOXYCYCLINE HYCLATE 100 MG PO TABS
100.0000 mg | ORAL_TABLET | Freq: Two times a day (BID) | ORAL | Status: AC
  Administered 2020-10-02 – 2020-10-07 (×10): 100 mg via ORAL
  Filled 2020-10-02 (×10): qty 1

## 2020-10-02 MED ORDER — RISPERIDONE 1 MG PO TABS
2.0000 mg | ORAL_TABLET | Freq: Every day | ORAL | Status: DC
  Administered 2020-10-07: 23:00:00 2 mg via ORAL
  Filled 2020-10-02 (×5): qty 2

## 2020-10-02 MED ORDER — LATANOPROST 0.005 % OP SOLN
1.0000 [drp] | Freq: Every day | OPHTHALMIC | Status: DC
  Administered 2020-10-02 – 2020-10-05 (×3): 1 [drp] via OPHTHALMIC
  Filled 2020-10-02 (×2): qty 2.5

## 2020-10-02 MED ORDER — LOSARTAN POTASSIUM 25 MG PO TABS
100.0000 mg | ORAL_TABLET | Freq: Every day | ORAL | Status: DC
  Administered 2020-10-03 – 2020-10-08 (×6): 100 mg via ORAL
  Filled 2020-10-02 (×6): qty 4

## 2020-10-02 MED ORDER — LOXAPINE SUCCINATE 5 MG PO CAPS
10.0000 mg | ORAL_CAPSULE | Freq: Every day | ORAL | Status: DC
  Filled 2020-10-02: qty 1
  Filled 2020-10-02 (×4): qty 2
  Filled 2020-10-02: qty 1
  Filled 2020-10-02 (×3): qty 2

## 2020-10-02 MED ORDER — ALBUTEROL SULFATE (2.5 MG/3ML) 0.083% IN NEBU
2.5000 mg | INHALATION_SOLUTION | RESPIRATORY_TRACT | Status: DC | PRN
  Administered 2020-10-03: 2.5 mg via RESPIRATORY_TRACT
  Filled 2020-10-02: qty 3

## 2020-10-02 MED ORDER — DEXAMETHASONE SODIUM PHOSPHATE 10 MG/ML IJ SOLN
10.0000 mg | Freq: Once | INTRAMUSCULAR | Status: AC
  Administered 2020-10-02: 10 mg via INTRAVENOUS
  Filled 2020-10-02: qty 1

## 2020-10-02 MED ORDER — ATORVASTATIN CALCIUM 40 MG PO TABS
40.0000 mg | ORAL_TABLET | Freq: Every day | ORAL | Status: DC
  Administered 2020-10-03 – 2020-10-08 (×6): 40 mg via ORAL
  Filled 2020-10-02 (×6): qty 1

## 2020-10-02 MED ORDER — ADULT MULTIVITAMIN W/MINERALS CH
1.0000 | ORAL_TABLET | Freq: Every day | ORAL | Status: DC
  Administered 2020-10-03 – 2020-10-08 (×6): 1 via ORAL
  Filled 2020-10-02 (×6): qty 1

## 2020-10-02 MED ORDER — UMECLIDINIUM BROMIDE 62.5 MCG/INH IN AEPB
1.0000 | INHALATION_SPRAY | Freq: Every day | RESPIRATORY_TRACT | Status: DC
  Administered 2020-10-04 – 2020-10-09 (×6): 1 via RESPIRATORY_TRACT
  Filled 2020-10-02 (×2): qty 7

## 2020-10-02 MED ORDER — POLYETHYLENE GLYCOL 3350 17 G PO PACK
17.0000 g | PACK | Freq: Every day | ORAL | Status: DC
  Administered 2020-10-03 – 2020-10-08 (×5): 17 g via ORAL
  Filled 2020-10-02 (×6): qty 1

## 2020-10-02 MED ORDER — MOMETASONE FURO-FORMOTEROL FUM 200-5 MCG/ACT IN AERO
2.0000 | INHALATION_SPRAY | Freq: Two times a day (BID) | RESPIRATORY_TRACT | Status: DC
  Administered 2020-10-03 – 2020-10-09 (×13): 2 via RESPIRATORY_TRACT
  Filled 2020-10-02 (×4): qty 8.8

## 2020-10-02 MED ORDER — LORATADINE 10 MG PO TABS
10.0000 mg | ORAL_TABLET | Freq: Every day | ORAL | Status: DC
  Administered 2020-10-03 – 2020-10-08 (×6): 10 mg via ORAL
  Filled 2020-10-02 (×6): qty 1

## 2020-10-02 MED ORDER — PANTOPRAZOLE SODIUM 40 MG PO TBEC
40.0000 mg | DELAYED_RELEASE_TABLET | Freq: Every day | ORAL | Status: DC
  Administered 2020-10-03 – 2020-10-08 (×6): 40 mg via ORAL
  Filled 2020-10-02 (×6): qty 1

## 2020-10-02 MED ORDER — BENZTROPINE MESYLATE 1 MG PO TABS
0.5000 mg | ORAL_TABLET | Freq: Every day | ORAL | Status: DC
  Filled 2020-10-02 (×5): qty 1

## 2020-10-02 MED ORDER — HYDROCHLOROTHIAZIDE 12.5 MG PO CAPS
12.5000 mg | ORAL_CAPSULE | Freq: Every morning | ORAL | Status: DC
  Administered 2020-10-03 – 2020-10-08 (×6): 12.5 mg via ORAL
  Filled 2020-10-02 (×6): qty 1

## 2020-10-02 MED ORDER — PREDNISONE 20 MG PO TABS
20.0000 mg | ORAL_TABLET | Freq: Every day | ORAL | Status: DC
  Administered 2020-10-03 – 2020-10-08 (×7): 20 mg via ORAL
  Filled 2020-10-02 (×7): qty 1

## 2020-10-02 MED ORDER — TIOTROPIUM BROMIDE MONOHYDRATE 2.5 MCG/ACT IN AERS: 2.0000 | INHALATION_SPRAY | Freq: Every day | RESPIRATORY_TRACT | Status: DC

## 2020-10-02 MED ORDER — FAMOTIDINE 20 MG PO TABS
20.0000 mg | ORAL_TABLET | Freq: Every day | ORAL | Status: DC
  Administered 2020-10-02 – 2020-10-08 (×7): 20 mg via ORAL
  Filled 2020-10-02 (×7): qty 1

## 2020-10-02 NOTE — TOC Initial Note (Signed)
Transition of Care [facility]) - Initial/Assessment Note    Patient Details  Name: Tony Sullivan MRN: [id number] Date of Birth: Aug 07, 1961  [facility] CM/SW Contact:    Iona Beard, [place] Phone Number: 10/02/2020, 8:11 PM  Clinical Narrative:                 [facility] received consult for SNF placement. CSW visited pt in ED to complete assessment. Pt states he lives alone and has no family. Pt states that he can sometimes complete ADLs though he is having more difficulty more days than not currently. Pt does not drive and has transportation through community services. Pt states that he is on home O2 supplied through [employer]. Pt states that he has had [facility] in the past. Pt denies any current substance use, states he was alcoholic in past but currently does not use any substances. CSW spoke with pt about interest in SNF placement. Pt states that he has been trying to get into somewhere and has a completed Fl2 by his PCP however he is having difficulty. Pt states that he was going to go to [facility] but due to being on O2 they were unable to accept him. CSW informed pt of referral process for SNF. Pt states he would like to go to the [facility] however, he is accepting of referral being made to all local facilities. CSW explained to pt that PT will need to see him to evaluate and make a recommendation. TOC to follow.   Expected Discharge Plan: Skilled Nursing Facility Barriers to Discharge: Continued Medical Work up   Patient Goals and CMS Choice Patient states their goals for this hospitalization and ongoing recovery are:: Go to SNF for rehab CMS Medicare.gov Compare Post Acute Care list provided to:: Patient Choice offered to / list presented to : Patient  Expected Discharge Plan and Services Expected Discharge Plan: [place] In-house Referral: Clinical Social Work Discharge Planning Services: CM Consult Post Acute Care Choice: [place] Living arrangements for the past 2 months: Apartment                                      Prior Living Arrangements/Services Living arrangements for the past 2 months: Apartment Lives with:: Self Patient language and need for interpreter reviewed:: Yes Do you feel safe going back to the place where you live?: Yes          Current home services: DME (Oxygen, electric wheelchair) Criminal Activity/Legal Involvement Pertinent to Current Situation/Hospitalization: No - Comment as needed  Activities of Daily Living      Permission Sought/Granted                  Emotional Assessment Appearance:: Appears stated age Attitude/Demeanor/Rapport: Engaged Affect (typically observed): Accepting Orientation: : Oriented to Self, Oriented to Place, Oriented to  Time, Oriented to Situation Alcohol / Substance Use: Other (comment) (Pt denies any current substance use, in past but none currently) Psych Involvement: No (comment)  Admission diagnosis:  Chest Pain; Weakness Patient Active Problem List   Diagnosis Date Noted  . Hyperlipidemia 05/24/2020  . Hypokalemia 05/24/2020  . Chest pain 05/23/2020  . Bipolar disorder ([place]) 05/23/2020  . HNP (herniated nucleus pulposus), cervical 09/06/2019  . Abdominal pain   . Poor dentition 11/04/2017  . Acute on chronic respiratory failure with hypoxia ([place]) 08/29/2017  .  ETOH abuse 03/30/2017  . Cigarette smoker 03/30/2017  . Acute respiratory failure with hypoxia and hypercapnia ([place]) 03/29/2017  . Special screening for malignant neoplasms, colon   . Benign neoplasm of ascending colon   . Benign neoplasm of descending colon   . Hoarseness 08/22/2016  . GERD (gastroesophageal reflux disease) 08/22/2016  . Atypical chest pain 12/05/2015  . Essential hypertension 06/15/2014  . Pectoralis muscle strain 01/11/2014  . COPD with acute exacerbation ([place]) 01/10/2014  . COPD  GOLD III 01/08/2014  . Smoker 01/08/2014  . Cough  01/08/2014  . Chronic respiratory failure with hypoxia ([facility]) 01/08/2014   PCP:  [name], FNP Pharmacy:   [facility] [address] [place] [address] Phone: [phone] Fax: [phone]     Social Determinants of Health (SDOH) Interventions    Readmission Risk Interventions Readmission Risk Prevention Plan 09/08/2019  Transportation Screening Complete  PCP or Specialist Appt within 3-5 Days Complete  HRI or [place] Complete  Social Work Consult for [place] Planning/Counseling Complete  Palliative Care Screening Not Applicable  Medication Review Press photographer) Complete  Some recent data might be hidden

## 2020-10-02 NOTE — ED Provider Notes (Signed)
[facility] EMERGENCY DEPARTMENT Provider Note   CSN: [id number] Arrival date & time: 10/02/20  1214     History Chief Complaint  Patient presents with  . Chest Pain    Tony Sullivan is a 59 y.o. male.  HPI Patient with multiple medical issues including COPD, oxygen, 24/7 presents for the second time in 36 hours with chest pain. He notes that he had chest tightness preceding yesterday's evaluation and it has remained essentially unchanged, though persistent after discharge. In addition to his ongoing sternal chest pressure/tightness, he feels left arm numbness, and bilateral lower extremity swelling.  Each of these complaints is new today, without clear precipitant.  Each of them is also worsening over the course the day without relief or exacerbating factors. No new fever, no new syncope, no new vomiting. He takes all of his other medication as directed, and states that he was wearing his oxygen. Chart review provides additional details including yesterday's visit and Myoview scan performed earlier this year with no diagnostic ST changes indicating ischemia, low risk study.  EF about 61%.    Past Medical History:  Diagnosis Date  . Anxiety   . Arthritis   . COPD (chronic obstructive pulmonary disease) ([place]) 2004   diagnosed in 2004, no PFT's to date.  Started on home O2 12/2013, after found to be desatting at PCP's office, and referred to pulmonology.  . Depression   . GERD (gastroesophageal reflux disease)   . High blood pressure   . Prediabetes   . Seasonal allergies   . Tobacco dependence     Patient Active Problem List   Diagnosis Date Noted  . Hyperlipidemia 05/24/2020  . Hypokalemia 05/24/2020  . Chest pain 05/23/2020  . Bipolar disorder ([place]) 05/23/2020  . HNP (herniated nucleus pulposus), cervical 09/06/2019  . Abdominal pain   . Poor dentition 11/04/2017  . Acute on chronic respiratory failure with hypoxia ([place]) 08/29/2017  . ETOH abuse 03/30/2017  .  Cigarette smoker 03/30/2017  . Acute respiratory failure with hypoxia and hypercapnia ([place]) 03/29/2017  . Special screening for malignant neoplasms, colon   . Benign neoplasm of ascending colon   . Benign neoplasm of descending colon   . Hoarseness 08/22/2016  . GERD (gastroesophageal reflux disease) 08/22/2016  . Atypical chest pain 12/05/2015  . Essential hypertension 06/15/2014  . Pectoralis muscle strain 01/11/2014  . COPD with acute exacerbation ([place]) 01/10/2014  . COPD  GOLD III 01/08/2014  . Smoker 01/08/2014  . Cough 01/08/2014  . Chronic respiratory failure with hypoxia ([place]) 01/08/2014    Past Surgical History:  Procedure Laterality Date  . ANTERIOR CERVICAL DECOMP/DISCECTOMY FUSION N/A 09/06/2019   Procedure: Anterior Cervical Discecectomy Fusion - Cerivcal Five-Cervical Six;  Surgeon: Kary Kos, MD;  Location: [place];  Service: Neurosurgery;  Laterality: N/A;  Anterior Cervical Discecectomy Fusion - Cerivcal Five-Cervical Six  . ANTERIOR CERVICAL DISCECTOMY  09/06/2019  . APPENDECTOMY  1980  . COLONOSCOPY WITH PROPOFOL N/A 12/10/2016   Procedure: COLONOSCOPY WITH PROPOFOL;  Surgeon: [name], MD;  Location: [facility];  Service: Endoscopy;  Laterality: N/A;  . HIP SURGERY Right   . SHOULDER ARTHROSCOPY Right        Family History  Problem Relation Age of Onset  . Emphysema Mother   . Allergies Mother        "everyone in family"  . Asthma Mother        "everyone in family"  . Heart disease Mother   . Clotting disorder  Mother   . Cancer Mother   . Emphysema Father   . Allergies Father   . Allergies Son   . Seizures Brother   . Diabetes Brother     Social History   Tobacco Use  . Smoking status: Current Some Day Smoker    Packs/day: 0.00    Years: 36.00    Pack years: 0.00    Types: Cigarettes  . Smokeless tobacco: Never Used  . Tobacco comment: down to 5-6 cigs a day  Vaping Use  . Vaping Use: Never used  Substance Use Topics  . Alcohol use:  Not Currently    Alcohol/week: 14.0 - 21.0 standard drinks    Types: 14 - 21 Standard drinks or equivalent per week    Comment: 2-3 beers/day, with occasional "binges".  No history of withdrawal  . Drug use: Not Currently    Types: Marijuana, Cocaine    Comment: as of November has not used for a month    Home Medications Prior to Admission medications   Medication Sig Start Date End Date Taking? Authorizing Provider  albuterol (PROAIR HFA) 108 (90 Base) MCG/ACT inhaler Inhale 2 puffs into the lungs every 4 (four) hours as needed. 08/29/20  Yes [name], MD  albuterol (PROVENTIL) (2.5 MG/3ML) 0.083% nebulizer solution Take 3 mLs (2.5 mg total) by nebulization every 4 (four) hours as needed for wheezing or shortness of breath. 04/28/18  Yes [name], NP  atorvastatin (LIPITOR) 40 MG tablet TAKE ONE TABLET BY MOUTH DAILY 07/06/20  Yes [name], MD  benztropine (COGENTIN) 0.5 MG tablet Take 0.5 mg by mouth at bedtime.    Yes [provider]  budesonide-formoterol (SYMBICORT) 160-4.5 MCG/ACT inhaler INHALE TWO PUFFS BY MOUTH INTO THE LUNGS TWICE A DAY 08/29/20  Yes [name], MD  cetirizine (ZYRTEC) 10 MG tablet Take 10 mg by mouth daily.   Yes [provider]  diclofenac Sodium (VOLTAREN) 1 % GEL Apply 2 g topically 4 (four) times daily.   Yes [provider]  famotidine (PEPCID) 20 MG tablet Take 1 tablet (20 mg total) by mouth at bedtime. 06/12/20  Yes [name], MD  fluticasone (FLONASE) 50 MCG/ACT nasal spray Place 2 sprays into both nostrils 3 (three) times daily as needed for allergies.    Yes [provider]  hydrochlorothiazide (MICROZIDE) 12.5 MG capsule TAKE ONE CAPSULE BY MOUTH EVERY MORNING Patient taking differently: Take 12.5 mg by mouth in the morning.  05/16/20  Yes Tolia, Sunit, DO  latanoprost (XALATAN) 0.005 % ophthalmic solution Place 1 drop into both eyes at bedtime. 05/24/19  Yes [provider]   losartan (COZAAR) 100 MG tablet Take 100 mg by mouth daily.   Yes [provider]  loxapine (LOXITANE) 10 MG capsule Take 10 mg by mouth at bedtime.    Yes [provider]  mirtazapine (REMERON) 30 MG tablet Take 30 mg by mouth at bedtime.   Yes [provider]  Multiple Vitamin (MULTIVITAMIN) capsule Take 1 capsule by mouth daily.   Yes [provider]  OXYGEN Inhale 2 L into the lungs as directed. 2-3 liters with sleep and exertion as needed for low oxygen   Yes [provider]  pantoprazole (PROTONIX) 40 MG tablet Take 40 mg by mouth daily.   Yes [provider]  polyethylene glycol (MIRALAX / GLYCOLAX) 17 g packet TAKE 17 GRAMS BY MOUTH DAILY Patient taking differently: Take 17 g by mouth daily.  03/09/20  Yes [name], MD  predniSONE (DELTASONE) 10 MG tablet Take 2 tablets (20 mg total) by mouth daily with breakfast. Patient takes 10mg  every other day, and 20 mg on other days. 06/12/20  Yes [name], NP  risperiDONE (RISPERDAL) 2 MG tablet Take 2 mg by mouth at bedtime.   Yes [provider]  Tiotropium Bromide Monohydrate (SPIRIVA RESPIMAT) 2.5 MCG/ACT AERS Inhale 2 puffs into the lungs daily. 08/29/20  Yes [name], MD  doxycycline (VIBRAMYCIN) 100 MG capsule Take 1 capsule (100 mg total) by mouth 2 (two) times daily. 10/01/20   Rolland Porter, MD    Allergies    Penicillins and Levocetirizine  Review of Systems   Review of Systems  Constitutional:       Per HPI, otherwise negative  HENT:       Per HPI, otherwise negative  Respiratory:       Per HPI, otherwise negative  Cardiovascular:       Per HPI, otherwise negative  Gastrointestinal: Negative for vomiting.  Endocrine:       Negative aside from HPI  Genitourinary:       Neg aside from HPI   Musculoskeletal:       Per HPI, otherwise negative  Skin: Negative.   Neurological: Negative for syncope.    Physical Exam Updated Vital Signs BP 95/69    Pulse 80   Temp 98.9 F (37.2 C) (Oral)   Resp (!) 22   Ht 5\' 11"  (1.803 m)   Wt 71.7 kg   SpO2 94%   BMI 22.04 kg/m   Physical Exam Vitals and nursing note reviewed.  Constitutional:      Appearance: He is ill-appearing.  HENT:     Head: Normocephalic and atraumatic.  Eyes:     Conjunctiva/sclera: Conjunctivae normal.  Cardiovascular:     Rate and Rhythm: Normal rate and regular rhythm.  Pulmonary:     Effort: Pulmonary effort is normal. No respiratory distress.     Breath sounds: No stridor.     Comments: O2- 3l -96%, abnormal Abdominal:     General: There is no distension.  Musculoskeletal:     Right lower leg: Edema present.     Left lower leg: Edema present.  Skin:    General: Skin is warm and dry.  Neurological:     Mental Status: He is alert and oriented to person, place, and time.     ED Results / Procedures / Treatments   Labs (all labs ordered are listed, but only abnormal results are displayed) Labs Reviewed  BASIC METABOLIC PANEL - Abnormal; Notable for the following components:      Result Value   Potassium 3.2 (*)    Glucose, Bld 113 (*)    Calcium 8.8 (*)    All other components within normal limits  CBC - Abnormal; Notable for the following components:   WBC 18.1 (*)    MCV 101.1 (*)    All other components within normal limits  BRAIN NATRIURETIC PEPTIDE - Abnormal; Notable for the following components:   B Natriuretic Peptide 204.0 (*)    All other components within normal limits  D-DIMER, QUANTITATIVE (NOT AT [facility]) - Abnormal; Notable for the following components:   D-Dimer, Quant 0.56 (*)    All other components within normal limits  TROPONIN I (HIGH SENSITIVITY)  TROPONIN I (HIGH SENSITIVITY)    EKG Sinus tach 103-left axis deviation, substantial artifact, abnormal On cardiac monitor the  patient has rate sinus about 105, otherwise unremarkable   Radiology DG Chest 1 View  Result Date: 10/02/2020 CLINICAL DATA:  Chest pain.  EXAM: CHEST  1 VIEW COMPARISON:  September 02, 2020. FINDINGS: The heart size and mediastinal contours are within normal limits. No pneumothorax or pleural effusion is noted. Emphysematous disease is again noted throughout both lungs. No acute pulmonary disease is noted. The visualized skeletal structures are unremarkable. IMPRESSION: No active disease. Emphysema (ICD10-J43.9). Electronically Signed   By: Marijo Conception M.D.   On: 10/02/2020 13:13    Procedures Procedures (including critical care time)  Medications Ordered in ED Medications  dexamethasone (DECADRON) injection 10 mg (10 mg Intravenous Given 10/02/20 1925)    ED Course  I have reviewed the triage vital signs and the nursing notes.  Pertinent labs & imaging results that were available during my care of the patient were reviewed by me and considered in my medical decision making (see chart for details).  On repeat exam patient is awake and alert. We will add more lengthy conversation about today's presentation, he notes that beyond yesterday and today he has had increasingly persistent dyspnea, weakness, now interfering with ability to perform ADL. Labs thus far unremarkable in terms of new life-threatening pathology.  When he does have slight elevation in his leukocytosis, but no x-ray evidence for pneumonia. Labs in terms of coronary ischemia reassuring, 2 normal troponin, nonischemic EKG, given the similar findings 36 hours ago, little evidence for atypical ACS. No new oxygen requirement.  Patient requests assistance for entering skilled nursing facility given his inability to perform ADL.  9:07 PM Remaining labs generally reassuring.  Age-adjusted D-dimer negative, and not remarkably different than several drawn over the past few years. With no new oxygen requirement, no persistent chest pain, CT angiography not indicated. I discussed his case with our social work Medical laboratory scientific officer, who have seen and evaluated the  patient. Initiation of process for placement into skilled nursing has been made, the patient is awaiting bed placement, likely tomorrow. Final Clinical Impression(s) / ED Diagnoses Final diagnoses:  Atypical chest pain  Weakness     Carmin Muskrat, MD 10/02/20 2111

## 2020-10-02 NOTE — ED Triage Notes (Signed)
Chest pain that radiates down left arm, headache that began ~4am this morning. Wear O2 @ home 3-4 L. 100% in triage.  Has been seen for same complaints multiple times.

## 2020-10-02 NOTE — Discharge Instructions (Addendum)
Today's evaluation has been generally reassuring. He did not have a fever, there is currently no evidence for pneumonia.  Your symptoms are likely due to your chronic disease progressing.  However, if you develop new, or concerning changes, return here for additional evaluation and management. Otherwise, proceed to your nursing facility, and follow-up with your physician

## 2020-10-02 NOTE — Telephone Encounter (Signed)
10/02/20  Was able to reach the patient telephonically.  He has had multiple emergency room visits within the last month.  He was seen on 09/02/2020 as a COPD exacerbation.  He was also seen recently on 12/5 for/2021 for atypical chest pain.  He was prescribed doxycycline.  He contacted our office to report that he was in the emergency room and also that he was not feeling much better.  He has an upcoming appointment with Dr. [name] in January/2022 in the [place] clinic.  Plan: Patient scheduled for telephonic visit on 10/04/2020 with Dr. [name] at [phone]  We will route message to Dr. [name] as Juluis Rainier.  If Dr. [name] has additional recommendations he can respond to this message that way triage can follow-up with the patient.  We have encouraged the patient to seek emergent evaluation if symptoms should worsen prior to the 10/04/2020 virtual visit  Wyn Quaker FNP

## 2020-10-03 NOTE — ED Notes (Signed)
Received care of patient resting in bed.  0 s/s acute distress.  Call bell in reach. 

## 2020-10-03 NOTE — TOC Progression Note (Signed)
Transition of Care [facility]) - Progression Note    Patient Details  Name: Tony Sullivan MRN: [id number] Date of Birth: 10/19/1961  Transition of Care [facility]) CM/SW [place], [place] Phone Number: 10/03/2020, 9:44 PM  Clinical Narrative:    Pt has bed offer from [facility]. CSW visited pt in ED to inform of bed offer. Pt has questions about facility. Pt wants to know if he can have visitors. Can he go outside. Can he go to his case at court on January 5th. He also has two doctors appointments on January 4th and 10th, can he make it to those? Pt also wants to know if someone will be able to get him his own clothes. CSW inquired about family or friends being able to bring him clothes, pt states he has no one. Pt also asking if someone at facility can help him with finding an independent living, CSW informed pt that there is a Education officer, museum at facility who may be able to assist and provide some resources, they may not be able to place but may offer assistance. CSW informed that TOC will f/u tomorrow with answers to all questions. TOC to follow.    Expected Discharge Plan: Skilled Nursing Facility Barriers to Discharge: ED Awaiting State Approval [name]), SNF Pending bed offer  Expected Discharge Plan and Services Expected Discharge Plan: [place] In-house Referral: Clinical Social Work Discharge Planning Services: CM Consult Post Acute Care Choice: [place] Living arrangements for the past 2 months: Apartment                                       Social Determinants of Health (SDOH) Interventions    Readmission Risk Interventions Readmission Risk Prevention Plan 09/08/2019  Transportation Screening Complete  PCP or Specialist Appt within 3-5 Days Complete  HRI or [place] Complete  Social Work Consult for [place] Planning/Counseling Complete  Palliative Care Screening Not Applicable  Medication Review Human resources officer) Complete  Some recent data might be hidden

## 2020-10-03 NOTE — TOC Progression Note (Signed)
Transition of Care [facility]) - Progression Note   Patient Details  Name: Tony Sullivan MRN: [id number] Date of Birth: 08-12-1961  Transition of Care [facility]) CM/SW [place], LCSW Phone Number: 10/03/2020, 2:02 PM  Clinical Narrative: FL2 completed and PASRR documents uploaded. CSW referred patient out for SNF. TOC awaiting bed offers and PASRR approval.  Expected Discharge Plan: [place] Barriers to Discharge: Continued Medical Work up  Expected Discharge Plan and Services Expected Discharge Plan: [place] In-house Referral: Clinical Social Work Discharge Planning Services: CM Consult Post Acute Care Choice: [place] arrangements for the past 2 months: Apartment  Readmission Risk Interventions Readmission Risk Prevention Plan 09/08/2019  Transportation Screening Complete  PCP or Specialist Appt within 3-5 Days Complete  HRI or Home Care Consult Complete  Social Work Consult for [place] Planning/Counseling Complete  Palliative Care Screening Not Applicable  Medication Review Press photographer) Complete  Some recent data might be hidden

## 2020-10-03 NOTE — Care Management (Cosign Needed)
RE: Tony Sullivan Date of Birth: 1961/07/02 Date: 10/03/2020 MUST ID: [id number]    To Whom It May Concern:   Please be advised that the above name patient will require a short-term nursing home stay--anticipated 30 days or less rehabilitation and strengthening. The plan is for return home.

## 2020-10-03 NOTE — Evaluation (Addendum)
Physical Therapy Evaluation Patient Details Name: Tony Sullivan MRN: [id number] DOB: 15-Feb-1961 Today's Date: 10/03/2020   History of Present Illness  Tony Sullivan is a 59 y.o. male.   Patient states he started having chest pain 2 days ago. The pain has been constant and is located in the center of his chest that he describes as tightness. He states it hurts more with breathing. He states he is chronically short of breath but it seems worse the past couple days. He complains of wheezing. He is having a cough with some green mucus but denies rhinorrhea, sore throat or fever. He also denies nausea, vomiting, or diarrhea. Patient states he ran out of oxygen and his small tank coming to the ED however he can refill it once he gets home. He also states he ran out of all of his medications earlier in the week however the male did deliver it yesterday, December 4. He is chronically on oxygen 3 L/min nasal cannula.    Clinical Impression  Patient demonstrates labored movement for sitting up at bedside,  limited to a few steps marching in place at bedside due to c/o fatigue, unsteadiness and SpO2 dropping from 92% to 83% while on 2 LPM O2.  Patient put back to bed after therapy due to fatigue.  Patient will benefit from continued physical therapy in hospital and recommended venue below to increase strength, balance, endurance for safe ADLs and gait.    Follow Up Recommendations SNF    Equipment Recommendations  None recommended by PT    Recommendations for Other Services       Precautions / Restrictions Precautions Precautions: Fall Restrictions Weight Bearing Restrictions: No      Mobility  Bed Mobility Overal bed mobility: Needs Assistance Bed Mobility: Supine to Sit;Sit to Supine     Supine to sit: Supervision Sit to supine: Supervision   General bed mobility comments: increased time, labored movement    Transfers Overall transfer level: Needs assistance Equipment used: Rolling  walker (2 wheeled) Transfers: Sit to/from Stand Sit to Stand: Min assist         General transfer comment: increased time, labored movement  Ambulation/Gait Ambulation/Gait assistance: Min assist Gait Distance (Feet): 8 Feet Assistive device: Rolling walker (2 wheeled) Gait Pattern/deviations: Decreased step length - right;Decreased step length - left;Decreased stride length Gait velocity: decreased   General Gait Details: limited to 8-9 unsteady labored steps at bedside marching in place due to c/o fatigue, SpO2 dropping from 92% to 83% while on 2 LPM  Stairs            Wheelchair Mobility    Modified Rankin (Stroke Patients Only)       Balance Overall balance assessment: Needs assistance Sitting-balance support: No upper extremity supported;Feet supported Sitting balance-Leahy Scale: Fair Sitting balance - Comments: seated at EOB   Standing balance support: No upper extremity supported;During functional activity Standing balance-Leahy Scale: Poor Standing balance comment: fair using RW                             Pertinent Vitals/Pain Pain Assessment: No/denies pain    Home Living Family/patient expects to be discharged to:: Private residence Living Arrangements: Alone Available Help at Discharge: Family;Available PRN/intermittently Type of Home: Apartment Home Access: Stairs to enter Entrance Stairs-Rails: None Entrance Stairs-Number of Steps: 22 Home Layout: Multi-level Home Equipment: Walker - 2 wheels;Cane - single point;Wheelchair - power  Prior Function Level of Independence: Independent with assistive device(s);Needs assistance   Gait / Transfers Assistance Needed: household ambulator using SPC, uses power w/c for longer distances  ADL's / Homemaking Assistance Needed: assisted for community ADLs, friends, neighbors        Engineer, manufacturing Dominance        Extremity/Trunk Assessment   Upper Extremity Assessment Upper Extremity  Assessment: Generalized weakness    Lower Extremity Assessment Lower Extremity Assessment: Generalized weakness    Cervical / Trunk Assessment Cervical / Trunk Assessment: Normal  Communication   Communication: No difficulties  Cognition Arousal/Alertness: Awake/alert Behavior During Therapy: WFL for tasks assessed/performed Overall Cognitive Status: Within Functional Limits for tasks assessed                                        General Comments      Exercises     Assessment/Plan    PT Assessment Patient needs continued PT services  PT Problem List Decreased strength;Decreased activity tolerance;Decreased balance;Decreased mobility;Cardiopulmonary status limiting activity       PT Treatment Interventions DME instruction;Gait training;Stair training;Functional mobility training;Therapeutic activities;Therapeutic exercise;Balance training;Patient/family education    PT Goals (Current goals can be found in the Care Plan section)  Acute Rehab PT Goals Patient Stated Goal: return home after rehab PT Goal Formulation: With patient/family Time For Goal Achievement: 10/17/20 Potential to Achieve Goals: Good    Frequency Min 3X/week   Barriers to discharge        Co-evaluation               AM-PAC PT "6 Clicks" Mobility  Outcome Measure Help needed turning from your back to your side while in a flat bed without using bedrails?: None Help needed moving from lying on your back to sitting on the side of a flat bed without using bedrails?: A Little Help needed moving to and from a bed to a chair (including a wheelchair)?: A Little Help needed standing up from a chair using your arms (e.g., wheelchair or bedside chair)?: A Little Help needed to walk in hospital room?: A Lot Help needed climbing 3-5 steps with a railing? : A Lot 6 Click Score: 17    End of Session Equipment Utilized During Treatment: Oxygen Activity Tolerance: Patient tolerated  treatment well;Patient limited by fatigue Patient left: in bed;with call bell/phone within reach Nurse Communication: Mobility status PT Visit Diagnosis: Unsteadiness on feet (R26.81);Other abnormalities of gait and mobility (R26.89);Muscle weakness (generalized) (M62.81)    Time: 7408-1448 PT Time Calculation (min) (ACUTE ONLY): 28 min   Charges:   PT Evaluation $PT Eval Moderate Complexity: 1 Mod PT Treatments $Therapeutic Activity: 23-37 mins        12:41 PM, 10/03/20 Lonell Grandchild, MPT Physical Therapist with [facility] [phone] office [phone] mobile phone

## 2020-10-03 NOTE — Plan of Care (Signed)
  Problem: Acute Rehab PT Goals(only PT should resolve) Goal: Pt Will Go Supine/Side To Sit Outcome: Progressing Flowsheets (Taken 10/03/2020 1238) Pt will go Supine/Side to Sit: with modified independence Goal: Patient Will Transfer Sit To/From Stand Outcome: Progressing Flowsheets (Taken 10/03/2020 1238) Patient will transfer sit to/from stand: with min guard assist Goal: Pt Will Transfer Bed To Chair/Chair To Bed Outcome: Progressing Flowsheets (Taken 10/03/2020 1238) Pt will Transfer Bed to Chair/Chair to Bed: min guard assist Goal: Pt Will Ambulate Outcome: Progressing Flowsheets (Taken 10/03/2020 1238) Pt will Ambulate:  25 feet  with min guard assist  with rolling walker  with cane   12:38 PM, 10/03/20 Lonell Grandchild, MPT Physical Therapist with [facility] [phone] office [phone] mobile phone

## 2020-10-03 NOTE — NC FL2 (Signed)
[place] MEDICAID FL2 LEVEL OF CARE SCREENING TOOL     IDENTIFICATION  Patient Name: Tony Sullivan Birthdate: May 10, 1961 Sex: male Admission Date (Current Location): 10/02/2020  [place] and [place] Number:  [place] [facility] [id number] [place] and Address:  [place] [address] [place]      Provider Number: [id number]  Attending Physician Name and Address:  Default, Provider, MD  Relative Name and Phone Number:  Marvis Repress (brother) Ph: [phone]    Current Level of Care: Hospital Recommended Level of Care: [place] Prior Approval Number:    Date Approved/Denied:   PASRR Number:    Discharge Plan: SNF    Current Diagnoses: Patient Active Problem List   Diagnosis Date Noted  . Hyperlipidemia 05/24/2020  . Hypokalemia 05/24/2020  . Chest pain 05/23/2020  . Bipolar disorder ([place]) 05/23/2020  . HNP (herniated nucleus pulposus), cervical 09/06/2019  . Abdominal pain   . Poor dentition 11/04/2017  . Acute on chronic respiratory failure with hypoxia ([place]) 08/29/2017  . ETOH abuse 03/30/2017  . Cigarette smoker 03/30/2017  . Acute respiratory failure with hypoxia and hypercapnia ([place]) 03/29/2017  . Special screening for malignant neoplasms, colon   . Benign neoplasm of ascending colon   . Benign neoplasm of descending colon   . Hoarseness 08/22/2016  . GERD (gastroesophageal reflux disease) 08/22/2016  . Atypical chest pain 12/05/2015  . Essential hypertension 06/15/2014  . Pectoralis muscle strain 01/11/2014  . COPD with acute exacerbation ([place]) 01/10/2014  . COPD  GOLD III 01/08/2014  . Smoker 01/08/2014  . Cough 01/08/2014  . Chronic respiratory failure with hypoxia (HCC) 01/08/2014    Orientation RESPIRATION BLADDER Height & Weight     Self, Time, Situation, Place  O2 (3L/min) Continent Weight: 158 lb (71.7 kg) Height:  5\' 11"  (180.3 cm)  BEHAVIORAL SYMPTOMS/MOOD NEUROLOGICAL BOWEL NUTRITION STATUS       Continent Diet (Regular)  AMBULATORY STATUS COMMUNICATION OF NEEDS Skin   Limited Assist Verbally Normal                       Personal Care Assistance Level of Assistance  Bathing, Feeding, Dressing Bathing Assistance: Limited assistance Feeding assistance: Independent Dressing Assistance: Limited assistance     Functional Limitations Info  Sight, Hearing, Speech Sight Info: Adequate Hearing Info: Adequate Speech Info: Adequate    SPECIAL CARE FACTORS FREQUENCY  PT (By licensed PT)     PT Frequency: 5x's/week              Contractures Contractures Info: Not present    Additional Factors Info  Allergies, Psychotropic   Allergies Info: Penicillins; Levocetirizine Psychotropic Info: Remeron (mirtazapine); Risperdal (risperidone)         Current Medications (10/03/2020):  This is the current hospital active medication list Current Facility-Administered Medications  Medication Dose Route Frequency Provider Last Rate Last Admin  . albuterol (PROVENTIL) (2.5 MG/3ML) 0.083% nebulizer solution 2.5 mg  2.5 mg Nebulization Q4H PRN Carmin Muskrat, MD      . atorvastatin (LIPITOR) tablet 40 mg  40 mg Oral Daily Carmin Muskrat, MD   40 mg at 10/03/20 0941  . benztropine (COGENTIN) tablet 0.5 mg  0.5 mg Oral QHS Carmin Muskrat, MD      . doxycycline (VIBRA-TABS) tablet 100 mg  100 mg Oral BID Carmin Muskrat, MD   100 mg at 10/03/20 0941  . famotidine (PEPCID) tablet 20 mg  20 mg Oral QHS Carmin Muskrat, MD  20 mg at 10/02/20 2307  . hydrochlorothiazide (MICROZIDE) capsule 12.5 mg  12.5 mg Oral q morning - 10a Carmin Muskrat, MD   12.5 mg at 10/03/20 0941  . latanoprost (XALATAN) 0.005 % ophthalmic solution 1 drop  1 drop Both Eyes QHS Carmin Muskrat, MD   1 drop at 10/02/20 2315  . loratadine (CLARITIN) tablet 10 mg  10 mg Oral Daily Carmin Muskrat, MD   10 mg at 10/03/20 0941  . losartan (COZAAR) tablet 100 mg  100 mg Oral Daily Carmin Muskrat, MD   100 mg  at 10/03/20 0940  . loxapine (LOXITANE) capsule 10 mg  10 mg Oral QHS Carmin Muskrat, MD      . mirtazapine (REMERON) tablet 30 mg  30 mg Oral QHS Carmin Muskrat, MD      . mometasone-formoterol [facility]) 200-5 MCG/ACT inhaler 2 puff  2 puff Inhalation BID Carmin Muskrat, MD   2 puff at 10/03/20 0735  . multivitamin with minerals tablet 1 tablet  1 tablet Oral Daily Carmin Muskrat, MD   1 tablet at 10/03/20 0941  . pantoprazole (PROTONIX) EC tablet 40 mg  40 mg Oral Daily Carmin Muskrat, MD   40 mg at 10/03/20 0940  . polyethylene glycol (MIRALAX / GLYCOLAX) packet 17 g  17 g Oral Daily Carmin Muskrat, MD   17 g at 10/03/20 0941  . predniSONE (DELTASONE) tablet 20 mg  20 mg Oral Q breakfast Carmin Muskrat, MD   20 mg at 10/03/20 0735  . risperiDONE (RISPERDAL) tablet 2 mg  2 mg Oral QHS Carmin Muskrat, MD      . umeclidinium bromide (INCRUSE ELLIPTA) 62.5 MCG/INH 1 puff  1 puff Inhalation Daily Pham, Minh Q, RPH-CPP       Current Outpatient Medications  Medication Sig Dispense Refill  . albuterol (PROAIR HFA) 108 (90 Base) MCG/ACT inhaler Inhale 2 puffs into the lungs every 4 (four) hours as needed. 18 g 3  . albuterol (PROVENTIL) (2.5 MG/3ML) 0.083% nebulizer solution Take 3 mLs (2.5 mg total) by nebulization every 4 (four) hours as needed for wheezing or shortness of breath. 75 mL 3  . atorvastatin (LIPITOR) 40 MG tablet TAKE ONE TABLET BY MOUTH DAILY 30 tablet 0  . benztropine (COGENTIN) 0.5 MG tablet Take 0.5 mg by mouth at bedtime.     . budesonide-formoterol (SYMBICORT) 160-4.5 MCG/ACT inhaler INHALE TWO PUFFS BY MOUTH INTO THE LUNGS TWICE A DAY 30.6 g 3  . cetirizine (ZYRTEC) 10 MG tablet Take 10 mg by mouth daily.    . diclofenac Sodium (VOLTAREN) 1 % GEL Apply 2 g topically 4 (four) times daily.    . famotidine (PEPCID) 20 MG tablet Take 1 tablet (20 mg total) by mouth at bedtime. 30 tablet 3  . fluticasone (FLONASE) 50 MCG/ACT nasal spray Place 2 sprays into both  nostrils 3 (three) times daily as needed for allergies.     . hydrochlorothiazide (MICROZIDE) 12.5 MG capsule TAKE ONE CAPSULE BY MOUTH EVERY MORNING (Patient taking differently: Take 12.5 mg by mouth in the morning. ) 30 capsule 6  . latanoprost (XALATAN) 0.005 % ophthalmic solution Place 1 drop into both eyes at bedtime.    Marland Kitchen losartan (COZAAR) 100 MG tablet Take 100 mg by mouth daily.    Marland Kitchen loxapine (LOXITANE) 10 MG capsule Take 10 mg by mouth at bedtime.     . mirtazapine (REMERON) 30 MG tablet Take 30 mg by mouth at bedtime.    . Multiple Vitamin (MULTIVITAMIN) capsule  Take 1 capsule by mouth daily.    . OXYGEN Inhale 2 L into the lungs as directed. 2-3 liters with sleep and exertion as needed for low oxygen    . pantoprazole (PROTONIX) 40 MG tablet Take 40 mg by mouth daily.    . polyethylene glycol (MIRALAX / GLYCOLAX) 17 g packet TAKE 17 GRAMS BY MOUTH DAILY (Patient taking differently: Take 17 g by mouth daily. ) 14 each 1  . predniSONE (DELTASONE) 10 MG tablet Take 2 tablets (20 mg total) by mouth daily with breakfast. Patient takes 10mg  every other day, and 20 mg on other days. 60 tablet 3  . risperiDONE (RISPERDAL) 2 MG tablet Take 2 mg by mouth at bedtime.    . Tiotropium Bromide Monohydrate (SPIRIVA RESPIMAT) 2.5 MCG/ACT AERS Inhale 2 puffs into the lungs daily. 12 g 3  . doxycycline (VIBRAMYCIN) 100 MG capsule Take 1 capsule (100 mg total) by mouth 2 (two) times daily. 20 capsule 0     Discharge Medications: Please see discharge summary for a list of discharge medications.  Relevant Imaging Results:  Relevant Lab Results:   Additional Information SSN: [id number]  Sherie Don, LCSW

## 2020-10-04 ENCOUNTER — Ambulatory Visit (INDEPENDENT_AMBULATORY_CARE_PROVIDER_SITE_OTHER): Payer: Medicaid Other | Admitting: Internal Medicine

## 2020-10-04 ENCOUNTER — Encounter: Payer: Self-pay | Admitting: Internal Medicine

## 2020-10-04 DIAGNOSIS — J9611 Chronic respiratory failure with hypoxia: Secondary | ICD-10-CM | POA: Diagnosis not present

## 2020-10-04 DIAGNOSIS — J449 Chronic obstructive pulmonary disease, unspecified: Secondary | ICD-10-CM

## 2020-10-04 NOTE — ED Notes (Signed)
Pt was given breakfast tray 

## 2020-10-04 NOTE — ED Notes (Signed)
Pt is awake & alert resting in bed watching TV. Pt assisted with towel to his wheelchair & food to his side tray. No distress noted.

## 2020-10-04 NOTE — ED Notes (Signed)
Pt assisted to the bathroom via wheelchair & assisted back to the room.

## 2020-10-04 NOTE — Progress Notes (Signed)
Subjective:   Patient ID: Tony Sullivan, male    DOB: [birth year]  MRN: [id number]    Brief patient profile:  16    yobm quit smoking June  2021 with breathing difficulties since 2004 dx as copd on advair 250 /50 but getting worse since 2014 and placed on 02 for the first time in March 2015 and referred 01/07/2014 to pulmonary clinic by Dr Kennon Holter with GOLD II copd criteria established 02/14/14    History of Present Illness  01/07/2014 1st [facility] office visit/ Tony Sullivan  Chief Complaint  Patient presents with  . Advice Only    Referred for COPD, SOB.  Pt c/o SOB, low 02, prod cough with white mucous.  Pt forgot med list, unsure of what all he is taking  still can do a [employer] but uses [facility] parking, assoc with congested coughing esp in am worse in winter. rec Stop advair   Plan A =  Automatic =  symbicort 160 Take 2 puffs first thing in am and then another 2 puffs about 12 hours later. spiriva in am only  Plan B = Backup- Proventil use it only if you can't your breath - ok to use up to every 4 hours if needed Plan C = Nebulizer up to every 4 hours if needed only if you try plan B first and it doesn't wor   07/27/2018  f/u ov/Tony Sullivan re: copd/ did not bring med calendar or all meds/ still smoking / prednisone 20 mg daily  Chief Complaint  Patient presents with  . Follow-up    States his breathing is about the same but when it is hot it gets much worse.   Dyspnea:  Across room even on 02 sob unless uses nebs / MMRC4  = sob if tries to leave home or while getting dressed   Cough: clear / rattling Sleeping: < 30 degrees from multiple pillows SABA use: way over using 02: 02 2lpm   rec For cough / congestion > mucinex up to 1200 mg every 12 hours and use the flutter valve as much as you can Try off tudorza and start back on spiriva 2.5  X 2 pffs each am  Call if unable to get back into [facility] rehab  Please schedule a follow up office visit in 6 weeks, call sooner if needed with  all medications /inhalers/ solutions in hand    09/18/2018  f/u ov/Tony Sullivan re:  GOLD III copd / prednisone 20 mg / did not bring med cal as req maint on symb 160/spiriva smi Chief Complaint  Patient presents with  . Follow-up    Breathing is doing well. He is using his albuterol inhaler 4-5 per day because he states he has been walking more. He uses his neb 3 to 4 x per wk.    Dyspnea:  Can't do [employer] s stopping on 1lpm but doesn't check sats or titrate up  Cough: esp in am x  first hour but mostly white and < a tbsp or two produced  Sleeping: ok flat  SABA use: as above 02: sleeping on 1.5- 2lpm and never above 1lpm walking  rec Try prednisone 10 mg daily to see if you do just as well and if flares then double the dose until better Only use your albuterol as a rescue medication  Please use flutter valve in am to help with the morning congestion     12/21/2018  f/u ov/Tony Sullivan re:  GOLD III copd/  pred 30 mg daily with floor 10 mg on symb 160/spiriva  Chief Complaint  Patient presents with  . Follow-up    Pt c/o sinus pressure off and on for the past wk. He is having some increased SOB this morning.    Dyspnea:  Can do shopping at grocery on 2lpm does not check sats  Cough: usual cough = rattling  Sleeping: bed is flat / pillows up to 30 degrees  SABA use: up to q 6 hours on neb  02: 2lpm  rec Plan A = Automatic = symbicort 160 Take 2 puffs first thing in am and then another 2 puffs about 12 hours later/spiriva 2.5  X 3 puffs each am  Work on inhaler technique Plan B = Backup Only use your albuterol inhaler as a rescue medication  Plan C = Crisis - only use your albuterol nebulizer if you first try Plan B and it fails to help > ok to use the nebulizer up to every 4 hours but if start needing it regularly call for immediate appointment Plan D = Deltasone  Take 2 daily until better then 1 daily    05/13/2019  f/u ov/Tony Sullivan re:  COPD III copd and pred 20 mg bid  Chief Complaint   Patient presents with  . Follow-up    Breathing has been progressively worse. He is still smoking. He is using his proair multiple x per day and neb daily.   Dyspnea:  Walking 2 blocks slow pace on 3-4 doesn't titrate  Cough: minimal esp am still clear  Sleeping: ok propped up 30 degrees  SABA use: way over using as above  02: no using it sitting , up to 4lpm walking, 2.5 hs  rec 02 is 2.5 lpm at bedtime and up to 4lpm POC walking   09/20/2019 televist rec Plan A = Automatic = Always=    symbicort and spiriva Plan B = Backup (to supplement plan A, not to replace it) Only use your albuterol inhaler as a rescue medication  Plan C = Crisis (instead of Plan B but only if Plan B stops working) - only use your albuterol nebulizer if you first try Plan B and it fails to help > ok to use the nebulizer up to every 4 hours but if start needing it regularly call for immediate appointment Plan D = Deltasone - if you need more albuterol especially the nebulizer to get thu the day then take prednisone 10 mg x 2 each am until better then return to 1 day    03/20/2020  f/u ov/Tony Sullivan re:  GOLD III/ prednisone @ 20 mg per day  Chief Complaint  Patient presents with  . Follow-up    pt is having sob and using rescue inhakler 5 to 6 times a week  Dyspnea:  Room to room despite 02  Cough: some in ams Sleeping: 30 degrees  SABA uses way too much  02: hs on 2.5  And 4lpm during the day, not titrating  rec Plan A = Automatic = Always=    symbicort and spiriva Plan B = Backup (to supplement plan A, not to replace it) Only use your albuterol inhaler as a rescue medication Plan C = Crisis (instead of Plan B but only if Plan B stops working) - only use your albuterol nebulizer if you first try Plan B  Plan D = Deltasone - if you need more albuterol especially the nebulizer to get thu the day then take prednisone 10 mg  x 2 each am until better then return to 1 day  Try albuterol 15 min (one day use the  inhaler then next the nebulizer) before an activity that you know would normally  Make you short of breath. The key is to stop smoking completely before smoking completely stops you!        Virtual Visit via Telephone Note 10/04/2020    I connected with Tony Sullivan on 10/04/20 at 8:15 AM EST by telephone and verified that I am speaking with the correct person using two identifiers. Pt is at home and this call made from my office with no other participants    I discussed the limitations, risks, security and privacy concerns of performing an evaluation and management service by telephone and the availability of in person appointments. I also discussed with the patient that there may be a patient responsible charge related to this service. The patient expressed understanding and agreed to proceed.   History of Present Illness: COPD with GOLD  III  spirometry / 02 dep @  24/7 maint symbicort/spirva  And prednisone 20 mg qod still not smoking  - misunderstood both pred and saba instructions ABCD plan above Dyspnea:  Always better at rest Cough: not much now on mucinex dm and doxy for recent flare Sleeping: bed is at 30 degrees  SABA use: hfa  Twice inhaler / neb twice every 12 hours  02: 2.5 lpm hs and 3-4lpm with sats 92%    No obvious day to day or daytime variability or assoc   mucus plugs or hemoptysis or cp or chest tightness, subjective wheeze or overt sinus or hb symptoms.    Also denies any obvious fluctuation of symptoms with weather or environmental changes or other aggravating or alleviating factors except as outlined above.   Meds reviewed/ med reconciliation completed     No outpatient medications have been marked as taking for the 10/04/20 encounter (Appointment) with [name], MD.         Observations/Objective: slt hoarse, talking in full phrases/ min rattling     Assessment and Plan: See problem list for active a/p's   Follow Up Instructions: See avs for  instructions unique to this ov which includes revised/ updated med list     I discussed the assessment and treatment plan with the patient. The patient was provided an opportunity to ask questions and all were answered. The patient agreed with the plan and demonstrated an understanding of the instructions.   The patient was advised to call back or seek an in-person evaluation if the symptoms worsen or if the condition fails to improve as anticipated.  I provided > 30  minutes of non-face-to-face time during this encounter.   Christinia Gully, MD

## 2020-10-04 NOTE — Assessment & Plan Note (Signed)
Quit smoking 03/2020  - 02/14/2014   try symbicort 160 2bid  - PFT's  02/14/2014   FEV1 1.82 (53%) and ratio 47 no better p alb (p spiriva and symbicort 160)  and DLCO 32 with 37%  07/2014 >Spiriva stopped  09/15/2014 Med calendar> not using 02/20/2015   - Declined rehab 07/31/15  - Quit smoking 11/2016  - flutter valve 04/01/2016  -med calendar 08/20/16  - added prn pred to action plan 12/05/2016  - changed to maint pred 02/25/2017 with floor of 10 mg daily >>>  - 06/05/2017  After extensive coaching device effectiveness =    90% smi > change tudorza to spiriva 2.5 x 2 > unable to afford - 11/03/2017  After extensive coaching inhaler device  effectiveness =    90% with DPI/ tudorza  - add pepcid 20 mg at hs 01/30/2018 for noct/ am wheeze - Spirometry 03/16/2018  FEV1 1.16 (34%)  Ratio 57 p am symb/ tudorza - referred again to rehab 06/10/2018  And 07/27/2018  > declined 09/18/2018  - 07/27/2018  After extensive coaching inhaler device,  effectiveness =    90% with respimat  - 09/18/2018 flutter valve training and started daily pred with floor 10 mg  - 12/21/2018  After extensive coaching inhaler device,  effectiveness =    75% (short Ti)  05/03/2020 - QUIT SMOKING in June 2021!    Group D in terms of symptom/risk and laba/lama/ICS  therefore appropriate rx at this point >>>  Continue symbicort and spiriva and prn saba and daily pred  Re saba: I spent extra time with pt today reviewing appropriate use of albuterol for prn use on exertion with the following points: 1) saba is for relief of sob that does not improve by walking a slower pace or resting but rather if the pt does not improve after trying this first. 2) If the pt is convinced, as many are, that saba helps recover from activity faster then it's easy to tell if this is the case by re-challenging : ie stop, take the inhaler, then p 5 minutes try the exact same activity (intensity of workload) that just caused the symptoms and see if they are substantially  diminished or not after saba 3) if there is an activity that reproducibly causes the symptoms, try the saba 15 min before the activity on alternate days   If in fact the saba really does help, then fine to continue to use it prn but advised may need to look closer at the maintenance regimen being used to achieve better control of airways disease with exertion.  Re Pred: The goal with a chronic steroid dependent illness is always arriving at the lowest effective dose that controls the disease/symptoms and not accepting a set "formula" which is based on statistics or guidelines that don't always take into account patient  variability or the natural hx of the dz in every individual patient, which may well vary over time.  For now therefore I recommend the patient maintain  20 mg ceiling and 10 mg floor  With 20/10 an option in between with goal of much less dependency on daily saba   F/u in Jan as planned with all meds in hand using a trust but verify approach to confirm accurate Medication  Reconciliation The principal here is that until we are certain that the  patients are doing what we've asked, it makes no sense to ask them to do more.   Each maintenance medication was reviewed  in detail including most importantly the difference between maintenance and as needed and under what circumstances the prns are to be used.  Please see AVS for specific  Instructions which are unique to this visit and I personally typed out  which were reviewed in detail over the phone with the patient and a copy provided echart

## 2020-10-04 NOTE — Assessment & Plan Note (Signed)
First started on 27 December 2013 by [facility] clinic - 02/14/2014  Walked RA x 3/4  laps @ 185 ft each stopped due to  desat 85% and corrected on 3lpm to x 2 laps  - 06/15/2014  Walked 2.5lpm  2 laps  @ 185 ft each stopped due to  Sob adequate sats  - 02/20/2015  Walked RA x 3 laps @ 185 ft each stopped due to  Sob/desat p 2 laps, nl pace, eliminated on his 02 at 3lpm  - 12/05/2015   Walked RA  2 laps @ 185 ft each stopped due to  desat to 88% and corrected on 2lpm  - 03/16/2018   Walked 3lmpm  x one lap @ 185 stopped due to /desat  To 89% at relatively fast pace  - 06/10/2018   Walked RA x one lap @ 185 stopped due to  Sob / desats to 86% resolved on repeating walk on 2lpm but still sob p one lap  - 07/27/2018  Walked 2lpm continuous x 2 laps @ 185 ft each stopped due to  End of study, nl pace, no desats but stopped due to sob - 09/18/2018   Walked 0.5 lpm  x one lap @ 1=210 ft stopped due to  Sob s desats 05/13/2019 Patient Saturations on Room Air at Rest = 97% then while Ambulating = 88% Patient Saturations on 4 Liters of pulsed oxygen while Ambulating = 94% 05/03/2020 Re-qualifed for oxygen. O2 88% on RA after 1 lap, patients saturations on 4L while ambulating 95%   rec as of   10/04/2020    2.5 lpm hs and adjust to keep > 90% walking

## 2020-10-04 NOTE — Patient Instructions (Addendum)
Plan A = Automatic = Always=    symbicort and spiriva daily   Plan B = Backup (to supplement plan A, not to replace it) Only use your albuterol inhaler as a rescue medication to be used if you can't catch your breath by resting or doing a relaxed purse lip breathing pattern.  - The less you use it, the better it will work when you need it. - Ok to use the inhaler up to 2 puffs  every 4 hours if you must but call for appointment if use goes up over your usual need - Don't leave home without it !!  (think of it like the spare tire for your car)   Plan C = Crisis (instead of Plan B but only if Plan B stops working) - only use your albuterol nebulizer if you first try Plan B and it fails to help > ok to use the nebulizer up to every 4 hours but if start needing it regularly call for immediate appointment   Plan D = Deltasone - if you need more albuterol especially the nebulizer to get thu the day then take prednisone 10 mg x 2 each am until better then taper to 10 mg daily  - ok to use the middle step of 20 mg alternating with 10 mg daily on even vs odd dates   Ok try albuterol 15 min (one day use the inhaler then next the nebulizer) before an activity that you know would normally  make you short of breath on adequate amount of oxygen ( and see if it makes any difference and if makes none then don't take it after activity unless you can't catch your breath.  For cough > mucinex or mucinex dm up to 1200 mg every 12 hours    Please keep your follow up appt  call sooner if needed with all medications /inhalers/ solutions in hand so we can verify exactly what you are taking. This includes all medications from all doctors and over the [place] separate them into two bags:  the ones you take automatically, no matter what, vs the ones you take just when you feel you need them "BAG #2 is UP TO YOU"  - this will really help [place] help you take your medications more effectively.

## 2020-10-04 NOTE — TOC Progression Note (Signed)
Transition of Care [facility]) - Progression Note   Patient Details  Name: Tony Sullivan MRN: [id number] Date of Birth: 1961/08/14  Transition of Care [facility]) CM/SW [place], LCSW Phone Number: 10/04/2020, 12:32 PM  Clinical Narrative: Lenna Sciara with [facility] spoke with patient to answer questions about SNF. CSW completed PASRR interview with patient and PASRR screener, Jacqualin Combes. CSW spoke with Melissa with [facility]. Per Lenna Sciara, patient can come as soon as his PASRR number is received as he is Medicaid. No insurance authorization is required as FL2 will be submitted by [facility] to DSS once patient is at the facility. Patient will need a COVID test once the PASRR is received. TOC awaiting PASRR approval.  Expected Discharge Plan: Skilled Nursing Facility Barriers to Discharge: ED Awaiting State Approval [name]), SNF Pending bed offer  Expected Discharge Plan and Services Expected Discharge Plan: [place] In-house Referral: Clinical Social Work Discharge Planning Services: CM Consult Post Acute Care Choice: [place] arrangements for the past 2 months: Apartment  Readmission Risk Interventions Readmission Risk Prevention Plan 09/08/2019  Transportation Screening Complete  PCP or Specialist Appt within 3-5 Days Complete  HRI or Home Care Consult Complete  Social Work Consult for [place] Planning/Counseling Complete  Palliative Care Screening Not Applicable  Medication Review Press photographer) Complete  Some recent data might be hidden

## 2020-10-05 NOTE — TOC Progression Note (Signed)
Transition of Care [facility]) - Progression Note   Patient Details  Name: NEEL BUFFONE MRN: [id number] Date of Birth: 02-09-1961  Transition of Care [facility]) CM/SW [place], LCSW Phone Number: 10/05/2020, 2:45 PM  Clinical Narrative: [name] still has not resulted. TOC awaiting approval.  Expected Discharge Plan: Skilled Nursing Facility Barriers to Discharge: ED Awaiting State Approval (PASRR),SNF Pending bed offer  Expected Discharge Plan and Services Expected Discharge Plan: [place] In-house Referral: Clinical Social Work Discharge Planning Services: CM Consult Post Acute Care Choice: [place] arrangements for the past 2 months: Apartment  Readmission Risk Interventions Readmission Risk Prevention Plan 09/08/2019  Transportation Screening Complete  PCP or Specialist Appt within 3-5 Days Complete  HRI or Home Care Consult Complete  Social Work Consult for [place] Planning/Counseling Complete  Palliative Care Screening Not Applicable  Medication Review Press photographer) Complete  Some recent data might be hidden

## 2020-10-06 NOTE — TOC Progression Note (Signed)
Transition of Care [facility]) - Progression Note   Patient Details  Name: FARAAZ WOLIN MRN: [id number] Date of Birth: 02/13/1961  Transition of Care [facility]) CM/SW [place], LCSW Phone Number: 10/06/2020, 2:57 PM  Clinical Narrative: [name] has not resulted. TOC continues to await state approval.  Expected Discharge Plan: [place] Barriers to Discharge: ED Awaiting State Approval (PASRR),SNF Pending bed offer  Expected Discharge Plan and Services Expected Discharge Plan: [place] In-house Referral: Clinical Social Work Discharge Planning Services: CM Consult Post Acute Care Choice: [place] arrangements for the past 2 months: Apartment  Readmission Risk Interventions Readmission Risk Prevention Plan 09/08/2019  Transportation Screening Complete  PCP or Specialist Appt within 3-5 Days Complete  HRI or Home Care Consult Complete  Social Work Consult for [place] Planning/Counseling Complete  Palliative Care Screening Not Applicable  Medication Review Press photographer) Complete  Some recent data might be hidden

## 2020-10-06 NOTE — ED Notes (Signed)
Pt was given breakfast tray 

## 2020-10-06 NOTE — Progress Notes (Signed)
Physical Therapy Treatment Patient Details Name: Tony Sullivan MRN: [id number] DOB: May 20, 1961 Today's Date: 10/06/2020    History of Present Illness Tony Sullivan is a 59 y.o. male.   Patient states he started having chest pain 2 days ago. The pain has been constant and is located in the center of his chest that he describes as tightness. He states it hurts more with breathing. He states he is chronically short of breath but it seems worse the past couple days. He complains of wheezing. He is having a cough with some green mucus but denies rhinorrhea, sore throat or fever. He also denies nausea, vomiting, or diarrhea. Patient states he ran out of oxygen and his small tank coming to the ED however he can refill it once he gets home. He also states he ran out of all of his medications earlier in the week however the male did deliver it yesterday, December 4. He is chronically on oxygen 3 L/min nasal cannula.    PT Comments    Patient demonstrates increased endurance/distance for ambulation in room/hallway without loss of balance using RW, requires frequent standing rest breaks due to SOB, on 3 LPM O2 with SpO2 at 91-92%, patient requested O2 increased to 4 LPM before making it back to room, limited mostly due to fatigue and SOB.  Patient demonstrates good return for completing BLE ROM/strengthening exercises while seated at bedside and put back to bed after therapy.  Patient will benefit from continued physical therapy in hospital and recommended venue below to increase strength, balance, endurance for safe ADLs and gait.   Follow Up Recommendations  SNF     Equipment Recommendations  None recommended by PT    Recommendations for Other Services       Precautions / Restrictions Precautions Precautions: Fall Restrictions Weight Bearing Restrictions: No    Mobility  Bed Mobility Overal bed mobility: Needs Assistance Bed Mobility: Supine to Sit;Sit to Supine     Supine to sit:  Supervision Sit to supine: Supervision   General bed mobility comments: laobred movement  Transfers Overall transfer level: Needs assistance Equipment used: Rolling walker (2 wheeled) Transfers: Sit to/from [facility] Sit to Stand: Min guard Stand pivot transfers: Min guard       General transfer comment: increased time, labored movement  Ambulation/Gait Ambulation/Gait assistance: Min assist Gait Distance (Feet): 55 Feet Assistive device: Rolling walker (2 wheeled) Gait Pattern/deviations: Decreased step length - right;Decreased step length - left;Decreased stride length Gait velocity: decreased   General Gait Details: demonstrates slow labored cadence with frequent standing rest breaks, no loss of balance, limited mostly due to fatigue, on 3 LPM with SpO2 at 91-92%   Stairs             Wheelchair Mobility    Modified Rankin (Stroke Patients Only)       Balance Overall balance assessment: Needs assistance Sitting-balance support: No upper extremity supported;Feet supported Sitting balance-Leahy Scale: Good     Standing balance support: During functional activity;Bilateral upper extremity supported Standing balance-Leahy Scale: Fair Standing balance comment: using RW                            Cognition Arousal/Alertness: Awake/alert Behavior During Therapy: WFL for tasks assessed/performed Overall Cognitive Status: Within Functional Limits for tasks assessed  Exercises      General Comments        Pertinent Vitals/Pain Pain Assessment: No/denies pain    Home Living                      Prior Function            PT Goals (current goals can now be found in the care plan section) Acute Rehab PT Goals Patient Stated Goal: return home after rehab PT Goal Formulation: With patient/family Time For Goal Achievement: 10/17/20 Potential to Achieve Goals:  Good Progress towards PT goals: Progressing toward goals    Frequency    Min 2X/week      PT Plan Current plan remains appropriate    Co-evaluation              AM-PAC PT "6 Clicks" Mobility   Outcome Measure  Help needed turning from your back to your side while in a flat bed without using bedrails?: None Help needed moving from lying on your back to sitting on the side of a flat bed without using bedrails?: None Help needed moving to and from a bed to a chair (including a wheelchair)?: A Little Help needed standing up from a chair using your arms (e.g., wheelchair or bedside chair)?: A Little Help needed to walk in hospital room?: A Little Help needed climbing 3-5 steps with a railing? : A Lot 6 Click Score: 19    End of Session Equipment Utilized During Treatment: Oxygen Activity Tolerance: Patient tolerated treatment well;Patient limited by fatigue Patient left: in bed;with call bell/phone within reach Nurse Communication: Mobility status PT Visit Diagnosis: Unsteadiness on feet (R26.81);Other abnormalities of gait and mobility (R26.89);Muscle weakness (generalized) (M62.81)     Time: 8850-2774 PT Time Calculation (min) (ACUTE ONLY): 27 min  Charges:  $Therapeutic Activity: 23-37 mins                     10:06 AM, 10/06/20 Lonell Grandchild, MPT Physical Therapist with [facility] 336 [phone] office [phone] mobile phone

## 2020-10-07 NOTE — ED Notes (Signed)
Called to room by patient. Patient had questions about SNF placement. Pt informed this RN that he did not want to go to [facility] and would prefer to have in home services for rehabilitation. RN informed patient that she was unsure if those services were available for him but would pass on to inform social worker.

## 2020-10-08 NOTE — ED Notes (Signed)
Per report, pt has been accepted by [facility] for [facility]  Pt refused this placement, SW was unawre? Of refusal and other referrals cancelled  Pt has been delayed for placement due to lack of communication with new referrals   Continues in Ed awaiting disposition

## 2020-10-08 NOTE — ED Provider Notes (Signed)
This patient is wanting to go home, he will be able to leave tomorrow morning, he will have home health services available and at his residence, he is aware of this and amenable to stay the rest of the evening.  He is still requiring oxygen as per his chronic need, he wants to leave before 10:00.  It is imperative that he leave at that time.   [name], MD 10/08/20 2043

## 2020-10-08 NOTE — ED Notes (Signed)
Spoke w pt regarding plan of care for discharge  He reports he must be discharged tomorrow by 1000  That is when his home health aide will meet him at home

## 2020-10-08 NOTE — TOC Progression Note (Signed)
Transition of Care [facility]) - Progression Note    Patient Details  Name: Tony Sullivan MRN: [id number] Date of Birth: January 06, 1961  Transition of Care [facility]) CM/SW Contact  Shade Flood, LCSW Phone Number: 10/08/2020, 4:30 PM  Clinical Narrative:     TOC following. Notified by ED staff that pt wanting to speak with LCSW about dc planning. Spoke with pt by phone. Pt states that he does not want SNF placement any longer. He says he is concerned about having to turn over his check to the NH because he still needs to pay his rent so that he can keep his apartment. At this time, pt desires to return home with [facility] if possible and if not, he states that he will do outpatient PT at the [place] [facility]. Per pt, he has an aide every AM and he states that he cannot dc until tomorrow AM so she can be there to assist him. Pt reportedly has 20 stairs to climb to get to his apartment. Pt will use his power wheelchair as transport home and his aide will be there to assist him when he gets there. Pt would like to dc by 10AM.  Attempted to refer for [facility] PT to [facility] though pt was apparently with their services before and was cut off for not being available at scheduled appointment times. Pt was active with [facility] after that. TOC will call [facility] in AM to see if they can accept pt again.   Pt also asking about a Rollator for dc. ED SW will follow up on this in AM as well. This item could be shipped to pt at home.  Pt does state that his landlord is hoping to have a first floor apartment for him in the very near future.   Updated RN and NT. TOC will follow.  Expected Discharge Plan: [place] Services Barriers to Discharge: Other (comment) (patient wants to go home now but can't dc until AM when his aide is available.)  Expected Discharge Plan and Services Expected Discharge Plan: [place] In-house Referral: Clinical Social Work Discharge Planning Services: CM Consult Post Acute  Care Choice: [place] arrangements for the past 2 months: Apartment                                       Social Determinants of Health (SDOH) Interventions    Readmission Risk Interventions Readmission Risk Prevention Plan 09/08/2019  Transportation Screening Complete  PCP or Specialist Appt within 3-5 Days Complete  HRI or [place] Complete  Social Work Consult for [place] Planning/Counseling Complete  Palliative Care Screening Not Applicable  Medication Review Press photographer) Complete  Some recent data might be hidden

## 2020-10-08 NOTE — ED Notes (Signed)
Pt has bathed and is looking forward to being discharged to home in am

## 2020-10-08 NOTE — ED Notes (Signed)
Pt reports he does not wish a ECF placement  Rather, he deisres to go home with help there  He is informed that SW has been requested to see  Him to discuss   He is agreeable to this

## 2020-10-08 NOTE — ED Notes (Signed)
Pt resting comfortably at this time.

## 2020-10-08 NOTE — ED Provider Notes (Signed)
7:30 AM-patient with chronic respiratory illness, on oxygen, with very diminished capacity for walking secondary to shortness of breath.  For physical therapy needs criteria for placement in skilled nursing facility.  Patient has indicated that he wants to go home however he does not appear to be able to maintain himself in that setting.  Currently he is sleeping.  His vital signs have been stable.  He is taking his medications.   Daleen Bo, MD 10/08/20 [phone]

## 2020-10-09 ENCOUNTER — Other Ambulatory Visit: Payer: Self-pay | Admitting: Internal Medicine

## 2020-10-09 MED ORDER — ALBUTEROL SULFATE HFA 108 (90 BASE) MCG/ACT IN AERS
INHALATION_SPRAY | RESPIRATORY_TRACT | Status: AC
  Filled 2020-10-09: qty 6.7

## 2020-10-09 NOTE — ED Provider Notes (Signed)
  Physical Exam  BP (!) 126/96   Pulse (!) 102   Temp (!) 97.2 F (36.2 C) (Oral)   Resp 18   Ht 5\' 11"  (1.803 m)   Wt 71.7 kg   SpO2 94%   BMI 22.04 kg/m   Physical Exam  ED Course/Procedures     Procedures  MDM  Patient has been cleared for discharge.  More home health has been arranged.  Discharge home       Davonna Belling, MD 10/09/20 1030

## 2020-10-09 NOTE — TOC Transition Note (Signed)
Transition of Care [facility]) - CM/SW Discharge Note  Patient Details  Name: Tony Sullivan MRN: [id number] Date of Birth: 1961/05/09  Transition [facility] Care [facility]) CM/SW Contact:  Sherie Don, LCSW Phone Number: 10/09/2020, 9:52 AM  Clinical Narrative: CSW received PASRR # ([id number]), but patient now wants to discharge home with [facility]. CSW spoke with Melissa at [facility] to inform her patient is now declining SNF. [facility] and [facility] have already declined [facility] referral. OP PT order placed. Per patient, he wanted a rollator walker. However, after CSW made DME referral to [facility] with [employer], CSW was notified patient received a rolling walker in 2020 so insurance will not cover the rollator and he will need to private pay for one. [employer] to follow up with patient regarding cost. TOC signing off.  Final next level of care: OP Rehab Barriers to Discharge: ED Barriers Resolved  Patient Goals and CMS Choice Patient states their goals for this hospitalization and ongoing recovery are:: Go home with OP PT CMS Medicare.gov Compare Post Acute Care list provided to:: Patient Choice offered to / list presented to : Patient  Discharge Placement [place] number recieved: 10/09/20  Discharge Plan and Services In-house Referral: Clinical Social Work Discharge Planning Services: CM Consult Post Acute Care Choice: [place]          DME Arranged: N/A DME Agency: NA HH Arranged: NA HH Agency: NA  Readmission Risk Interventions Readmission Risk Prevention Plan 09/08/2019  Transportation Screening Complete  PCP or Specialist Appt within 3-5 Days Complete  HRI or Home Care Consult Complete  Social Work Consult for [place] Planning/Counseling Complete  Palliative Care Screening Not Applicable  Medication Review Press photographer) Complete  Some recent data might be hidden

## 2020-10-30 ENCOUNTER — Ambulatory Visit: Payer: Medicaid Other | Admitting: Cardiology

## 2020-11-06 ENCOUNTER — Encounter: Payer: Self-pay | Admitting: Internal Medicine

## 2020-11-06 ENCOUNTER — Ambulatory Visit (INDEPENDENT_AMBULATORY_CARE_PROVIDER_SITE_OTHER): Payer: Medicaid Other | Admitting: Internal Medicine

## 2020-11-06 ENCOUNTER — Other Ambulatory Visit: Payer: Self-pay

## 2020-11-06 DIAGNOSIS — J449 Chronic obstructive pulmonary disease, unspecified: Secondary | ICD-10-CM | POA: Diagnosis not present

## 2020-11-06 DIAGNOSIS — F17210 Nicotine dependence, cigarettes, uncomplicated: Secondary | ICD-10-CM | POA: Diagnosis not present

## 2020-11-06 DIAGNOSIS — J9611 Chronic respiratory failure with hypoxia: Secondary | ICD-10-CM | POA: Diagnosis not present

## 2020-11-06 MED ORDER — SPIRIVA RESPIMAT 2.5 MCG/ACT IN AERS: 2.0000 | INHALATION_SPRAY | Freq: Every day | RESPIRATORY_TRACT | 11 refills | Status: DC

## 2020-11-06 MED ORDER — BUDESONIDE-FORMOTEROL FUMARATE 160-4.5 MCG/ACT IN AERO
INHALATION_SPRAY | RESPIRATORY_TRACT | 11 refills | Status: DC
Start: 2020-11-06 — End: 2021-03-19

## 2020-11-06 MED ORDER — AZITHROMYCIN 250 MG PO TABS: ORAL_TABLET | ORAL | 0 refills | Status: DC

## 2020-11-06 NOTE — Progress Notes (Signed)
Subjective:   Patient ID: Tony Sullivan, male    DOB: 08-Mar-1961  MRN: [id number]    Brief patient profile:  4  yobm active smoker with breathing difficulties since 2004 dx as copd on advair 250 /50 but getting worse since 2014 and placed on 02 for the first time in March 2015 and referred 01/07/2014 to pulmonary clinic by Dr Kennon Holter with GOLD II copd criteria established 02/14/14    History of Present Illness  01/07/2014 1st [facility] office visit/ Icis Budreau  Chief Complaint  Patient presents with  . Advice Only    Referred for COPD, SOB.  Pt c/o SOB, low 02, prod cough with white mucous.  Pt forgot med list, unsure of what all he is taking  still can do a [employer] but uses [facility] parking, assoc with congested coughing esp in am worse in winter. rec Stop advair   Plan A =  Automatic =  symbicort 160 Take 2 puffs first thing in am and then another 2 puffs about 12 hours later. spiriva in am only  Plan B = Backup- Proventil use it only if you can't your breath - ok to use up to every 4 hours if needed Plan C = Nebulizer up to every 4 hours if needed only if you try plan B first and it doesn't wor   07/27/2018  f/u ov/Leeandra Ellerson re: copd/ did not bring med calendar or all meds/ still smoking / prednisone 20 mg daily  Chief Complaint  Patient presents with  . Follow-up    States his breathing is about the same but when it is hot it gets much worse.   Dyspnea:  Across room even on 02 sob unless uses nebs / MMRC4  = sob if tries to leave home or while getting dressed   Cough: clear / rattling Sleeping: < 30 degrees from multiple pillows SABA use: way over using 02: 02 2lpm   rec For cough / congestion > mucinex up to 1200 mg every 12 hours and use the flutter valve as much as you can Try off tudorza and start back on spiriva 2.5  X 2 pffs each am  Call if unable to get back into [facility] rehab  Please schedule a follow up office visit in 6 weeks, call sooner if needed with all  medications /inhalers/ solutions in hand    09/18/2018  f/u ov/Latia Mataya re:  GOLD III copd / prednisone 20 mg / did not bring med cal as req maint on symb 160/spiriva smi Chief Complaint  Patient presents with  . Follow-up    Breathing is doing well. He is using his albuterol inhaler 4-5 per day because he states he has been walking more. He uses his neb 3 to 4 x per wk.    Dyspnea:  Can't do [employer] s stopping on 1lpm but doesn't check sats or titrate up  Cough: esp in am x  first hour but mostly white and < a tbsp or two produced  Sleeping: ok flat  SABA use: as above 02: sleeping on 1.5- 2lpm and never above 1lpm walking  rec Try prednisone 10 mg daily to see if you do just as well and if flares then double the dose until better Only use your albuterol as a rescue medication  Please use flutter valve in am to help with the morning congestion     12/21/2018  f/u ov/Ingra Rother re:  GOLD III copd/ pred 30 mg daily with  floor 10 mg on symb 160/spiriva  Chief Complaint  Patient presents with  . Follow-up    Pt c/o sinus pressure off and on for the past wk. He is having some increased SOB this morning.    Dyspnea:  Can do shopping at grocery on 2lpm does not check sats  Cough: usual cough = rattling  Sleeping: bed is flat / pillows up to 30 degrees  SABA use: up to q 6 hours on neb  02: 2lpm  rec Plan A = Automatic = symbicort 160 Take 2 puffs first thing in am and then another 2 puffs about 12 hours later/spiriva 2.5  X 3 puffs each am  Work on inhaler technique Plan B = Backup Only use your albuterol inhaler as a rescue medication  Plan C = Crisis - only use your albuterol nebulizer if you first try Plan B and it fails to help > ok to use the nebulizer up to every 4 hours but if start needing it regularly call for immediate appointment Plan D = Deltasone  Take 2 daily until better then 1 daily    05/13/2019  f/u ov/Nitara Szczerba re:  COPD III copd and pred 20 mg bid  Chief Complaint  Patient  presents with  . Follow-up    Breathing has been progressively worse. He is still smoking. He is using his proair multiple x per day and neb daily.   Dyspnea:  Walking 2 blocks slow pace on 3-4 doesn't titrate  Cough: minimal esp am still clear  Sleeping: ok propped up 30 degrees  SABA use: way over using as above  02: no using it sitting , up to 4lpm walking, 2.5 hs  rec 02 is 2.5 lpm at bedtime and up to 4lpm POC walking   09/20/2019 televist rec Plan A = Automatic = Always=    symbicort and spiriva Plan B = Backup (to supplement plan A, not to replace it) Only use your albuterol inhaler as a rescue medication  Plan C = Crisis (instead of Plan B but only if Plan B stops working) - only use your albuterol nebulizer if you first try Plan B and it fails to help > ok to use the nebulizer up to every 4 hours but if start needing it regularly call for immediate appointment Plan D = Deltasone - if you need more albuterol especially the nebulizer to get thu the day then take prednisone 10 mg x 2 each am until better then return to 1 day    03/20/2020  f/u ov/Lavan Imes re:  GOLD III/ prednisone @ 20 mg per day  Chief Complaint  Patient presents with  . Follow-up    pt is having sob and using rescue inhakler 5 to 6 times a week  Dyspnea:  Room to room despite 02  Cough: some in ams Sleeping: 30 degrees  SABA uses way too much  02: hs on 2.5  And 4lpm during the day, not titrating  rec Plan A = Automatic = Always=    symbicort and spiriva daily  Plan B = Backup (to supplement plan A, not to replace it) Only use your albuterol inhaler as rescue   Plan C = Crisis (instead of Plan B but only if Plan B stops working) - only use your albuterol nebulizer if you first try Plan B Plan D = Deltasone - if you need more albuterol especially the nebulizer to get thu the day then take prednisone 10 mg x 2 each am  until better then taper to 10 mg daily  - ok to use the middle step of 20 mg alternating  with 10 mg daily on even vs odd dates  Ok try albuterol 15 min (one day use the inhaler then next the nebulizer) before an activity that you know would normally  make you short of breath  For cough > mucinex or mucinex dm up to 1200 mg every 12 hours    11/06/2020  f/u ov/[place] office/Dorsel Flinn re: GOLD III copd/ 02 dep still on pred at 20 mg daily  Chief Complaint  Patient presents with  . Follow-up    Tightness of chest on and off for about 2 weeks   Dyspnea:  Room to room  Cough: min mucoid in am/ resumed smoking  Sleeping:still 30 degrees  SABA use: way too much, not prechallenging as rec  02: 2.5- 4lpm depending on how he feels,not tracking sats though has oxyimetry    No obvious day to day or daytime variability or assoc excess/ purulent sputum or mucus plugs or hemoptysis or cp or subjective wheeze or overt sinus or hb symptoms.   Sleeping  without nocturnal  or early am exacerbation  of respiratory  c/o's or need for noct saba. Also denies any obvious fluctuation of symptoms with weather or environmental changes or other aggravating or alleviating factors except as outlined above   No unusual exposure hx or h/o childhood pna/ asthma or knowledge of premature birth.  Current Allergies, Complete Past Medical History, Past Surgical History, Family History, and Social History were reviewed in [employer] record.  ROS  The following are not active complaints unless bolded Hoarseness, sore throat, dysphagia, dental problems, itching, sneezing,  nasal congestion or discharge of excess mucus or purulent secretions, ear ache,   fever, chills, sweats, unintended wt loss or wt gain, classically pleuritic or exertional cp,  orthopnea pnd or arm/hand swelling  or leg swelling, presyncope, palpitations, abdominal pain, anorexia, nausea, vomiting, diarrhea  or change in bowel habits or change in bladder habits, change in stools or change in urine, dysuria, hematuria,  rash,  arthralgias, visual complaints, headache, numbness, weakness or ataxia or problems with walking or coordination,  change in mood or  memory.        Current Meds  Medication Sig  . albuterol (PROAIR HFA) 108 (90 Base) MCG/ACT inhaler Inhale 2 puffs into the lungs every 4 (four) hours as needed.  Marland Kitchen albuterol (PROVENTIL) (2.5 MG/3ML) 0.083% nebulizer solution Take 3 mLs (2.5 mg total) by nebulization every 4 (four) hours as needed for wheezing or shortness of breath.  Marland Kitchen atorvastatin (LIPITOR) 40 MG tablet TAKE ONE TABLET BY MOUTH DAILY  . azithromycin (ZITHROMAX) 250 MG tablet Take 2 on day one then 1 daily x 4 days  . benztropine (COGENTIN) 0.5 MG tablet Take 0.5 mg by mouth at bedtime.   . cetirizine (ZYRTEC) 10 MG tablet Take 10 mg by mouth daily.  . diclofenac Sodium (VOLTAREN) 1 % GEL Apply 2 g topically 4 (four) times daily.  . famotidine (PEPCID) 20 MG tablet Take 1 tablet (20 mg total) by mouth at bedtime.  . fluticasone (FLONASE) 50 MCG/ACT nasal spray Place 2 sprays into both nostrils 3 (three) times daily as needed for allergies.   . hydrochlorothiazide (MICROZIDE) 12.5 MG capsule TAKE ONE CAPSULE BY MOUTH EVERY MORNING (Patient taking differently: Take 12.5 mg by mouth in the morning.)  . latanoprost (XALATAN) 0.005 % ophthalmic solution Place 1 drop into  both eyes at bedtime.  Marland Kitchen losartan (COZAAR) 100 MG tablet Take 100 mg by mouth daily.  Marland Kitchen loxapine (LOXITANE) 10 MG capsule Take 10 mg by mouth at bedtime.   . megestrol (MEGACE) 40 MG tablet Take 40 mg by mouth daily.  . mirtazapine (REMERON) 30 MG tablet Take 30 mg by mouth at bedtime.  . Multiple Vitamin (MULTIVITAMIN) capsule Take 1 capsule by mouth daily.  . OXYGEN Inhale 2 L into the lungs as directed. 2-3 liters with sleep and exertion as needed for low oxygen  . pantoprazole (PROTONIX) 40 MG tablet Take 40 mg by mouth daily.  . polyethylene glycol (MIRALAX / GLYCOLAX) 17 g packet TAKE 17 GRAMS BY MOUTH DAILY (Patient taking  differently: Take 17 g by mouth daily.)  . predniSONE (DELTASONE) 10 MG tablet TAKE TWO TABLETS (20 MG TOTAL) BY MOUTH DAILY WITH BREAKFAST. PATIENT TAKES 10MG  EVERY OTHER DAY, AND 20 MG ON OTHER DAYS.  Marland Kitchen risperiDONE (RISPERDAL) 2 MG tablet Take 2 mg by mouth at bedtime.  .   budesonide-formoterol (SYMBICORT) 160-4.5 MCG/ACT inhaler INHALE TWO PUFFS BY MOUTH INTO THE LUNGS TWICE A DAY  . [ doxycycline (VIBRAMYCIN) 100 MG capsule Take 1 capsule (100 mg total) by mouth 2 (two) times daily.  .   Tiotropium Bromide Monohydrate (SPIRIVA RESPIMAT) 2.5 MCG/ACT AERS Inhale 2 puffs into the lungs daily.              Objective:   Physical Exam     11/06/2020   159 03/20/2020   162 05/13/2019  159  12/21/2018    163      06/15/2014  164  >  09/15/2014 158 >   02/20/2015 >156 03/22/2015 >  07/31/2015   150 > 12/05/2015  161   > 04/01/2016    151 > 07/29/2016  152 >   02/25/2017  153 >  06/05/2017  153 > 11/03/2017   166 > 01/30/2018  167> 03/16/2018  168 > 06/10/2018  167 > 07/27/2018  169 > 09/18/2018 163 >       Vital signs reviewed  11/06/2020  - Note at rest 02 sats  95% on 1lpm pulsed  General appearance:    W/c bound chronically ill appearing     HEENT : pt wearing mask not removed for exam due to covid -19 concerns.    NECK :  without JVD/Nodes/TM/ nl carotid upstrokes bilaterally   LUNGS: no acc muscle use,  Mod barrel  contour chest wall with bilateral  Distant bs s audible wheeze and  without cough on insp or exp maneuvers and mod  Hyperresonant  to  percussion bilaterally     CV:  RRR  no s3 or murmur or increase in P2, and no edema   ABD:  soft and nontender with pos mid insp Hoover's  in the supine position. No bruits or organomegaly appreciated, bowel sounds nl  MS:     ext warm without deformities, calf tenderness, cyanosis or clubbing No obvious joint restrictions   SKIN: warm and dry without lesions    NEURO:  alert, approp, nl sensorium with  no motor or cerebellar deficits apparent.

## 2020-11-06 NOTE — Assessment & Plan Note (Signed)
Quit smoking 03/2020  - 02/14/2014   try symbicort 160 2bid  - PFT's  02/14/2014   FEV1 1.82 (53%) and ratio 47 no better p alb (p spiriva and symbicort 160)  and DLCO 32 with 37%  07/2014 >Spiriva stopped  09/15/2014 Med calendar> not using 02/20/2015   - Declined rehab 07/31/15  - Quit smoking 11/2016  - flutter valve 04/01/2016  -med calendar 08/20/16  - added prn pred to action plan 12/05/2016  - changed to maint pred 02/25/2017 with floor of 10 mg daily >>>  - 06/05/2017  After extensive coaching device effectiveness =    90% smi > change tudorza to spiriva 2.5 x 2 > unable to afford - 11/03/2017  After extensive coaching inhaler device  effectiveness =    90% with DPI/ tudorza  - add pepcid 20 mg at hs 01/30/2018 for noct/ am wheeze - Spirometry 03/16/2018  FEV1 1.16 (34%)  Ratio 57 p am symb/ tudorza - referred again to rehab 06/10/2018  And 07/27/2018  > declined 09/18/2018  - 07/27/2018  After extensive coaching inhaler device,  effectiveness =    90% with respimat  - 09/18/2018 flutter valve training and started daily pred with floor 10 mg  - 12/21/2018  After extensive coaching inhaler device,  effectiveness =    75% (short Ti)  05/03/2020 - QUIT SMOKING in June 2021!     Group D in terms of symptom/risk and laba/lama/ICS  therefore appropriate rx at this point >>>  Continue symb 160 / spiriva and prn saba  I spent extra time with pt today reviewing appropriate use of albuterol for prn use on exertion with the following points: 1) saba is for relief of sob that does not improve by walking a slower pace or resting but rather if the pt does not improve after trying this first. 2) If the pt is convinced, as many are, that saba helps recover from activity faster then it's easy to tell if this is the case by re-challenging : ie stop, take the inhaler, then p 5 minutes try the exact same activity (intensity of workload) that just caused the symptoms and see if they are substantially diminished or not after  saba 3) if there is an activity that reproducibly causes the symptoms, try the saba 15 min before the activity on alternate days   If in fact the saba really does help, then fine to continue to use it prn but advised may need to look closer at the maintenance regimen being used to achieve better control of airways disease with exertion.

## 2020-11-06 NOTE — Assessment & Plan Note (Signed)
Counseled re importance of smoking cessation but did not meet time criteria for separate billing          Each maintenance medication was reviewed in detail including emphasizing most importantly the difference between maintenance and prns and under what circumstances the prns are to be triggered using an action plan format where appropriate.  Total time for H and P, chart review, counseling, reviewing hfa  device(s) and generating customized AVS unique to this office visit / same day charting = 22 min

## 2020-11-06 NOTE — Assessment & Plan Note (Signed)
First started on 27 December 2013 by [facility] clinic - 02/14/2014  Walked RA x 3/4  laps @ 185 ft each stopped due to  desat 85% and corrected on 3lpm to x 2 laps  - 06/15/2014  Walked 2.5lpm  2 laps  @ 185 ft each stopped due to  Sob adequate sats  - 02/20/2015  Walked RA x 3 laps @ 185 ft each stopped due to  Sob/desat p 2 laps, nl pace, eliminated on his 02 at 3lpm  - 12/05/2015   Walked RA  2 laps @ 185 ft each stopped due to  desat to 88% and corrected on 2lpm  - 03/16/2018   Walked 3lmpm  x one lap @ 185 stopped due to /desat  To 89% at relatively fast pace  - 06/10/2018   Walked RA x one lap @ 185 stopped due to  Sob / desats to 86% resolved on repeating walk on 2lpm but still sob p one lap  - 07/27/2018  Walked 2lpm continuous x 2 laps @ 185 ft each stopped due to  End of study, nl pace, no desats but stopped due to sob - 09/18/2018   Walked 0.5 lpm  x one lap @ 1=210 ft stopped due to  Sob s desats 05/13/2019 Patient Saturations on Room Air at Rest = 97% then while Ambulating = 88% Patient Saturations on 4 Liters of pulsed oxygen while Ambulating = 94% 05/03/2020 Re-qualifed for oxygen. O2 88% on RA after 1 lap, patients saturations on 4L while ambulating 95%   rec as of  11/06/2020    2.5 lpm hs and adjust to keep > 90% walking    Again advised to adjust flow daytime to keep sats > 90%

## 2020-11-06 NOTE — Patient Instructions (Addendum)
Continue pepcid 20 mg at bedtime and be sure to take protonix 40 mg Take 30-60 min before first meal of the day   Make sure you check your oxygen saturation  at your highest level of activity  to be sure it stays over 90% and adjust  02 flow upward to maintain this level if needed but remember to turn it back to previous settings when you stop (to conserve your supply).   For cough/ congestion/ tightness / discolored sputum > zpak    Please schedule a follow up visit in 3 months but call sooner if needed

## 2020-11-08 ENCOUNTER — Ambulatory Visit (HOSPITAL_COMMUNITY): Payer: Medicaid Other | Attending: Emergency Medicine | Admitting: Physical Therapy

## 2020-11-08 ENCOUNTER — Other Ambulatory Visit: Payer: Self-pay

## 2020-11-08 ENCOUNTER — Encounter (HOSPITAL_COMMUNITY): Payer: Self-pay | Admitting: Physical Therapy

## 2020-11-08 DIAGNOSIS — M6281 Muscle weakness (generalized): Secondary | ICD-10-CM | POA: Insufficient documentation

## 2020-11-08 NOTE — Therapy (Signed)
[place], [place], [address] Phone: [phone]   Fax:  [phone]  Physical Therapy Evaluation  Patient Details  Name: Tony Sullivan MRN: [id number] Date of Birth: 06-Feb-1961 Referring Provider (PT): Davonna Belling MD   Encounter Date: 11/08/2020   PT End of Session - 11/08/20 1417    Visit Number 1    Number of Visits 12    Date for PT Re-Evaluation 12/22/20    Authorization Type Medicaid [place]    Authorization - Visit Number 1    Authorization - Number of Visits 1    PT Start Time 1350    PT Stop Time 1425    PT Time Calculation (min) 35 min    Equipment Utilized During Treatment Gait belt    Activity Tolerance Patient limited by fatigue;Other (comment)    Behavior During Therapy WFL for tasks assessed/performed           Past Medical History:  Diagnosis Date  . Anxiety   . Arthritis   . COPD (chronic obstructive pulmonary disease) ([place]) 2004   diagnosed in 2004, no PFT's to date.  Started on home O2 12/2013, after found to be desatting at PCP's office, and referred to pulmonology.  . Depression   . GERD (gastroesophageal reflux disease)   . High blood pressure   . Prediabetes   . Seasonal allergies   . Tobacco dependence     Past Surgical History:  Procedure Laterality Date  . ANTERIOR CERVICAL DECOMP/DISCECTOMY FUSION N/A 09/06/2019   Procedure: Anterior Cervical Discecectomy Fusion - Cerivcal Five-Cervical Six;  Surgeon: Kary Kos, MD;  Location: [facility];  Service: Neurosurgery;  Laterality: N/A;  Anterior Cervical Discecectomy Fusion - Cerivcal Five-Cervical Six  . ANTERIOR CERVICAL DISCECTOMY  09/06/2019  . APPENDECTOMY  1980  . COLONOSCOPY WITH PROPOFOL N/A 12/10/2016   Procedure: COLONOSCOPY WITH PROPOFOL;  Surgeon: [name], MD;  Location: [facility];  Service: Endoscopy;  Laterality: N/A;  . HIP SURGERY Right   . SHOULDER  ARTHROSCOPY Right     There were no vitals filed for this visit.    Subjective Assessment - 11/08/20 1400    Subjective Patient presents to physical therapy with complaint of generalized weakness. He reports history of breathing issues related to COPD and emphysema. He also reports arthritis, and fractures in his back. He says this has limited him with what he can do and feels he spends a lot of time in his wheelchair. He says he wants to build his strength back up and work to get out of his chair more so he does not continue to weaken.    Pertinent History O 2 dependent, cervical surgery, back fx non surgical, COPD, emphysema    How long can you sit comfortably? no problem    How long can you stand comfortably? has not done this.    How long can you walk comfortably? with walker less than a minute.    Patient Stated Goals to walk more, get out of chair    Currently in Pain? Yes    Pain Score 2     Pain Location Chest    Pain Descriptors / Indicators Aching;Tightness    Pain Type Chronic pain    Pain Onset More than a month ago    Pain Frequency Intermittent    Aggravating Factors  "overexerting myself"    Pain Relieving Factors comes and goes  Effect of Pain on Daily Activities limits              [facility] PT Assessment - 11/08/20 0001      Assessment   Medical Diagnosis Weakness    Referring Provider (PT) Davonna Belling MD    Prior Therapy Yes      Precautions   Precautions None      Restrictions   Weight Bearing Restrictions No      Balance Screen   Has the patient fallen in the past 6 months No      [place] residence    Living Arrangements Alone      Prior Function   Level of Independence Needs assistance with ADLs    Comments Has aide come to home and help with ADLs 3 hours a day      Cognition   Overall Cognitive Status Within Functional Limits for tasks assessed      Transfers   Five time sit to stand comments  17  seconds with no UEs      Ambulation/Gait   Ambulation/Gait Yes    Ambulation/Gait Assistance 6: Modified independent (Device/Increase time)    Ambulation Distance (Feet) 120 Feet    Assistive device Straight cane    Gait Pattern Decreased dorsiflexion - right    Ambulation Surface Level;Indoor    Gait Comments 2MWT (could only complete 90 seconds), very faitgued, chest tightness, Sp02 87% at 3L                      Objective measurements completed on examination: See above findings.               PT Education - 11/08/20 1403    Education Details on evaluation findings, POC and HEP    Person(s) Educated Patient    Methods Explanation;Handout    Comprehension Verbalized understanding            PT Short Term Goals - 11/08/20 1423      PT SHORT TERM GOAL #1   Title Patient will be independent with initial HEP and self-management strategies to improve functional outcomes    Time 2    Period Weeks    Status New    Target Date 11/24/20             PT Long Term Goals - 11/08/20 1419      PT LONG TERM GOAL #1   Title Patient will be able to perform stand x 5 in < 13 seconds to demonstrate improvement in functional mobility and reduced risk for falls.    Time 6    Period Weeks    Status New    Target Date 12/22/20      PT LONG TERM GOAL #2   Title Patient will be able to ambulate at least 250 feet during 2MWT with LRAD to demonstrate improved ability to perform functional mobility and associated tasks.    Time 6    Period Weeks    Status New    Target Date 12/22/20      PT LONG TERM GOAL #3   Title Patient will be able to stand >5 minutes for improved ability to perform cooking/ grooming/ cleaning ADLs.    Time 6    Period Weeks    Status New    Target Date 12/22/20                  Plan -  11/08/20 1425    Clinical Impression Statement Patient is a 60 y.o. male who presents to physical therapy with complaint of weakness, and  decreased endurance. Patient demonstrates decreased functional strength, decreased activity tolerance and gait abnormalities which are likely contributing to symptoms of pain and are negatively impacting patient ability to perform ADLs and functional mobility tasks. Patient will benefit from skilled physical therapy services to address these deficits to reduce pain and improve level of function with ADLs and functional mobility tasks.    Personal Factors and Comorbidities Comorbidity 3+    Examination-Activity Limitations Bathing;Bed Mobility;Locomotion Level;Dressing;Stand;Stairs;Hygiene/Grooming;Transfers    Examination-Participation Restrictions Community Activity;Meal Prep;Cleaning    Stability/Clinical Decision Making Stable/Uncomplicated    Clinical Decision Making Low    Rehab Potential Fair    PT Frequency 2x / week    PT Duration 6 weeks    PT Treatment/Interventions Gait training;Patient/family education;Functional mobility training;Passive range of motion;Therapeutic exercise;Electrical Stimulation;Neuromuscular re-education;Balance training;Manual techniques;Therapeutic activities    PT Next Visit Plan Review goals and HEP. Progress LE and core strenghening to tolerance. Begin standing exercises as able. Monitor perceived exetion and SpO2 with activity. Increase ambualtion distance to tolerance    PT Home Exercise Plan Eval: heel toe raises, LAQs, seated amrching    Consulted and Agree with Plan of Care Patient           Patient will benefit from skilled therapeutic intervention in order to improve the following deficits and impairments:  Abnormal gait,Difficulty walking,Decreased strength,Decreased balance,Decreased activity tolerance,Cardiopulmonary status limiting activity,Hypomobility,Decreased endurance,Pain  Visit Diagnosis: Muscle weakness (generalized)     Problem List Patient Active Problem List   Diagnosis Date Noted  . Hyperlipidemia 05/24/2020  . Hypokalemia  05/24/2020  . Chest pain 05/23/2020  . Bipolar disorder ([place]) 05/23/2020  . HNP (herniated nucleus pulposus), cervical 09/06/2019  . Abdominal pain   . Poor dentition 11/04/2017  . Acute on chronic respiratory failure with hypoxia ([place]) 08/29/2017  . ETOH abuse 03/30/2017  . Cigarette smoker 03/30/2017  . Acute respiratory failure with hypoxia and hypercapnia ([place]) 03/29/2017  . Special screening for malignant neoplasms, colon   . Benign neoplasm of ascending colon   . Benign neoplasm of descending colon   . Hoarseness 08/22/2016  . GERD (gastroesophageal reflux disease) 08/22/2016  . Atypical chest pain 12/05/2015  . Essential hypertension 06/15/2014  . Pectoralis muscle strain 01/11/2014  . COPD with acute exacerbation ([place]) 01/10/2014  . COPD  GOLD III 01/08/2014  . Smoker 01/08/2014  . Cough 01/08/2014  . Chronic respiratory failure with hypoxia ([place]) 01/08/2014   2:30 PM, 11/08/20 Josue Hector PT DPT  Physical Therapist with [facility]  [phone] [place] [address] [place], [place], [address] Phone: [phone]   Fax:  [phone]  Name: Tony Sullivan MRN: [id number] Date of Birth: 09-Mar-1961

## 2020-11-14 ENCOUNTER — Telehealth (HOSPITAL_COMMUNITY): Payer: Self-pay | Admitting: Occupational Therapy

## 2020-11-14 ENCOUNTER — Encounter (HOSPITAL_COMMUNITY): Payer: Medicaid Other

## 2020-11-14 NOTE — Telephone Encounter (Signed)
Pt called to cx appt on 11/15/20 due to potential incoming weather. Reminded pt of next appt on 11/28/20.   Guadelupe Sabin, OTR/L  [phone]

## 2020-11-16 ENCOUNTER — Ambulatory Visit (HOSPITAL_COMMUNITY): Payer: Medicaid Other | Admitting: Physical Therapy

## 2020-11-24 ENCOUNTER — Telehealth: Payer: Self-pay | Admitting: Internal Medicine

## 2020-11-24 MED ORDER — ALBUTEROL SULFATE HFA 108 (90 BASE) MCG/ACT IN AERS
2.0000 | INHALATION_SPRAY | RESPIRATORY_TRACT | 2 refills | Status: DC | PRN
Start: 2020-11-24 — End: 2021-03-19

## 2020-11-24 NOTE — Telephone Encounter (Signed)
Spoke with patient. He stated that he was calling to get his ProAir sent to [facility] in [place] on [address].  I advised him that I would go ahead and send this in for him. He verbalized understanding.   Nothing further needed at time of call.

## 2020-11-28 ENCOUNTER — Other Ambulatory Visit: Payer: Self-pay

## 2020-11-28 ENCOUNTER — Ambulatory Visit (HOSPITAL_COMMUNITY): Payer: Medicaid Other | Attending: Emergency Medicine | Admitting: Physical Therapy

## 2020-11-28 ENCOUNTER — Encounter (HOSPITAL_COMMUNITY): Payer: Self-pay | Admitting: Physical Therapy

## 2020-11-28 DIAGNOSIS — M6281 Muscle weakness (generalized): Secondary | ICD-10-CM | POA: Insufficient documentation

## 2020-11-28 DIAGNOSIS — R2689 Other abnormalities of gait and mobility: Secondary | ICD-10-CM | POA: Insufficient documentation

## 2020-11-28 NOTE — Therapy (Signed)
[place], [place], [address] Phone: [phone]   Fax:  [phone]  Physical Therapy Treatment  Patient Details  Name: Tony Sullivan MRN: [id number] Date of Birth: 1961-05-14 Referring Provider (PT): Davonna Belling MD   Encounter Date: 11/28/2020   PT End of Session - 11/28/20 1444    Visit Number 2    Number of Visits 12    Date for PT Re-Evaluation 12/22/20    Authorization Type Medicaid [place] Time Period 3 approved 1/17-1/30 (submitted for 3 more check auth)    Authorization - Visit Number 0    Authorization - Number of Visits 1    PT Start Time 1435    PT Stop Time 1515    PT Time Calculation (min) 40 min    Equipment Utilized During Treatment Gait belt    Activity Tolerance Patient limited by fatigue;Other (comment)    Behavior During Therapy WFL for tasks assessed/performed           Past Medical History:  Diagnosis Date  . Anxiety   . Arthritis   . COPD (chronic obstructive pulmonary disease) ([place]) 2004   diagnosed in 2004, no PFT's to date.  Started on home O2 12/2013, after found to be desatting at PCP's office, and referred to pulmonology.  . Depression   . GERD (gastroesophageal reflux disease)   . High blood pressure   . Prediabetes   . Seasonal allergies   . Tobacco dependence     Past Surgical History:  Procedure Laterality Date  . ANTERIOR CERVICAL DECOMP/DISCECTOMY FUSION N/A 09/06/2019   Procedure: Anterior Cervical Discecectomy Fusion - Cerivcal Five-Cervical Six;  Surgeon: Kary Kos, MD;  Location: [facility];  Service: Neurosurgery;  Laterality: N/A;  Anterior Cervical Discecectomy Fusion - Cerivcal Five-Cervical Six  . ANTERIOR CERVICAL DISCECTOMY  09/06/2019  . APPENDECTOMY  1980  . COLONOSCOPY WITH PROPOFOL N/A 12/10/2016   Procedure: COLONOSCOPY WITH PROPOFOL;  Surgeon: [name], MD;  [facility];  Service: Endoscopy;  Laterality: N/A;  . HIP  SURGERY Right   . SHOULDER ARTHROSCOPY Right     There were no vitals filed for this visit.   Subjective Assessment - 11/28/20 1446    Subjective Patient says he is doing alright. His stomach feels kind of tight because he has been on the go this morning. he just moved into a new house. No pain currently. He missed the last 2 weeks appointments because of the snow and transportation issues.    Pertinent History O 2 dependent, cervical surgery, back fx non surgical, COPD, emphysema    How long can you sit comfortably? no problem    How long can you stand comfortably? has not done this.    How long can you walk comfortably? with walker less than a minute.    Patient Stated Goals to walk more, get out of chair    Currently in Pain? No/denies    Pain Onset More than a month ago              [facility] PT Assessment - 11/28/20 0001      Assessment   Medical Diagnosis Weakness    Referring Provider (PT) Davonna Belling MD    Prior Therapy Yes      Precautions   Precautions None      Restrictions   Weight Bearing Restrictions No      Balance Screen   Has the patient  fallen in the past 6 months No      [place] residence    Living Arrangements Alone      Prior Function   Level of Independence Needs assistance with ADLs      Cognition   Overall Cognitive Status Within Functional Limits for tasks assessed                         [facility] Adult PT Treatment/Exercise - 11/28/20 0001      Transfers   Five time sit to stand comments  17 seconds with no UEs      Ambulation/Gait   Ambulation/Gait Yes    Ambulation/Gait Assistance 6: Modified independent (Device/Increase time)    Ambulation Distance (Feet) 175 Feet    Assistive device Straight cane    Gait Pattern Decreased dorsiflexion - right    Ambulation Surface Level;Indoor    Gait Comments 2MWT (could only complete 90 seconds but improved distance)      Exercises    Exercises Knee/Hip      Knee/Hip Exercises: Standing   Heel Raises Both;2 sets;10 reps    Hip Flexion Both;2 sets;10 reps    Hip Flexion Limitations marching    Other Standing Knee Exercises tandem stance 2 x 30" holds      Knee/Hip Exercises: Seated   Sit to Sand 10 reps;without UE support                    PT Short Term Goals - 11/28/20 1644      PT SHORT TERM GOAL #1   Title Patient will be independent with initial HEP and self-management strategies to improve functional outcomes    Baseline Reports compliance with HEP    Time 2    Period Weeks    Status Achieved    Target Date 11/24/20             PT Long Term Goals - 11/28/20 1644      PT LONG TERM GOAL #1   Title Patient will be able to perform stand x 5 in < 13 seconds to demonstrate improvement in functional mobility and reduced risk for falls.    Baseline 17 seconds    Time 6    Period Weeks    Status On-going      PT LONG TERM GOAL #2   Title Patient will be able to ambulate at least 250 feet during 2MWT with LRAD to demonstrate improved ability to perform functional mobility and associated tasks.    Baseline 175 feet with SPC    Time 6    Period Weeks    Status On-going      PT LONG TERM GOAL #3   Title Patient will be able to stand >5 minutes for improved ability to perform cooking/ grooming/ cleaning ADLs.    Baseline Current 2-3 minutes    Time 6    Period Weeks    Status On-going                 Plan - 11/28/20 1448    Clinical Impression Statement Patient returns after near 3 week absence due to weather and transportation issues. Reassessed current functional level and progress toward therapy goals. Patient shows some improvement in activity tolerance and says he has been compliant with home exercises which are helping. Patient still limited by fatigue and decreased activity tolerance secondary to chronic COPD and inactivity. Progressed  activity with standing LE strengthening  today. Patient did well with these, but required frequent rest breaks as O2 will drop into 80s with 1-2 sets of each exercise. Patient given rest breaks to allow SpO2 to return above 90, and educated on pursed lip breathing. Patient very motivated and willing to participate at this time. Issued updated HEP handout. Patient will continue to benefit from skilled therapy services to progress LE strength and activity tolerance for improved functional mobility and ADLs.    Personal Factors and Comorbidities Comorbidity 3+    Examination-Activity Limitations Bathing;Bed Mobility;Locomotion Level;Dressing;Stand;Stairs;Hygiene/Grooming;Transfers    Examination-Participation Restrictions Community Activity;Meal Prep;Cleaning    Stability/Clinical Decision Making Stable/Uncomplicated    Rehab Potential Fair    PT Frequency 2x / week    PT Duration 6 weeks    PT Treatment/Interventions Gait training;Patient/family education;Functional mobility training;Passive range of motion;Therapeutic exercise;Electrical Stimulation;Neuromuscular re-education;Balance training;Manual techniques;Therapeutic activities    PT Next Visit Plan Progress LE and core strenghening to tolerance. Continue standing exercise. Monitor perceived exetion and SpO2 with activity. Increase ambualtion distance to tolerance    PT Home Exercise Plan Eval: heel toe raises, LAQs, seated marching 11/28/20 heel raise, sit to stand, marching, tandem stance    Consulted and Agree with Plan of Care Patient           Patient will benefit from skilled therapeutic intervention in order to improve the following deficits and impairments:  Abnormal gait,Difficulty walking,Decreased strength,Decreased balance,Decreased activity tolerance,Cardiopulmonary status limiting activity,Hypomobility,Decreased endurance,Pain  Visit Diagnosis: Muscle weakness (generalized)  Other abnormalities of gait and mobility     Problem List Patient Active Problem List    Diagnosis Date Noted  . Hyperlipidemia 05/24/2020  . Hypokalemia 05/24/2020  . Chest pain 05/23/2020  . Bipolar disorder ([place]) 05/23/2020  . HNP (herniated nucleus pulposus), cervical 09/06/2019  . Abdominal pain   . Poor dentition 11/04/2017  . Acute on chronic respiratory failure with hypoxia ([place]) 08/29/2017  . ETOH abuse 03/30/2017  . Cigarette smoker 03/30/2017  . Acute respiratory failure with hypoxia and hypercapnia ([place]) 03/29/2017  . Special screening for malignant neoplasms, colon   . Benign neoplasm of ascending colon   . Benign neoplasm of descending colon   . Hoarseness 08/22/2016  . GERD (gastroesophageal reflux disease) 08/22/2016  . Atypical chest pain 12/05/2015  . Essential hypertension 06/15/2014  . Pectoralis muscle strain 01/11/2014  . COPD with acute exacerbation ([place]) 01/10/2014  . COPD  GOLD III 01/08/2014  . Smoker 01/08/2014  . Cough 01/08/2014  . Chronic respiratory failure with hypoxia ([place]) 01/08/2014   4:45 PM, 11/28/20 Josue Hector PT DPT  Physical Therapist with [facility]  [phone] [place] [address] [place], [place], [address] Phone: [phone]   Fax:  [phone]  Name: QUEST TAVENNER MRN: [id number] Date of Birth: 09/27/1961

## 2020-11-28 NOTE — Patient Instructions (Signed)
Access Code: [email or web address] URL: [email or web address] Date: 11/28/2020 Prepared by: Josue Hector  Exercises Heel rises with counter support - 2-3 x daily - 7 x weekly - 1 sets - 10 reps Sit to Stand - 2-3 x daily - 7 x weekly - 1 sets - 10 reps Standing March with Counter Support - 2-3 x daily - 7 x weekly - 1 sets - 10 reps Standing Tandem Balance with Counter Support - 2-3 x daily - 7 x weekly - 1 sets - 3 reps - 30 second hold

## 2020-11-30 ENCOUNTER — Ambulatory Visit (HOSPITAL_COMMUNITY): Payer: Medicaid Other

## 2020-12-04 ENCOUNTER — Telehealth: Payer: Self-pay | Admitting: Internal Medicine

## 2020-12-04 MED ORDER — AZITHROMYCIN 250 MG PO TABS: ORAL_TABLET | ORAL | 11 refills | Status: DC

## 2020-12-04 NOTE — Telephone Encounter (Signed)
Called and spoke with patient who states that he is calling to get Z-pack filled. Pt states "MW just keeps a z-pack prescription for me and I need that right now."  Patient states that over the last couple days his chest has been tight and feels like he has a little cold. Covid test was negative. Patient has cough with mostly clear sputum but states there is a little yellow. States he moved Monday and has been in and out of the cold weather.  Dr. [name] please advise

## 2020-12-04 NOTE — Telephone Encounter (Signed)
Called and spoke with patient to let him know we were sending in RX for [place]. He verified pharamcy. RX sent. Nothing further needed at this time.

## 2020-12-04 NOTE — Telephone Encounter (Signed)
zpak with 11 refills ok   Also remind him of his "action plan"  with prednisone and if he doesn't have one then let me know as should be on avs

## 2020-12-05 ENCOUNTER — Other Ambulatory Visit: Payer: Self-pay

## 2020-12-05 ENCOUNTER — Ambulatory Visit (HOSPITAL_COMMUNITY): Payer: Medicaid Other | Admitting: Physical Therapy

## 2020-12-05 ENCOUNTER — Encounter (HOSPITAL_COMMUNITY): Payer: Self-pay | Admitting: Physical Therapy

## 2020-12-05 DIAGNOSIS — M6281 Muscle weakness (generalized): Secondary | ICD-10-CM | POA: Diagnosis present

## 2020-12-05 DIAGNOSIS — R2689 Other abnormalities of gait and mobility: Secondary | ICD-10-CM | POA: Diagnosis present

## 2020-12-05 NOTE — Therapy (Signed)
[place], [place], [address] Phone: [phone]   Fax:  [phone]  Physical Therapy Treatment  Patient Details  Name: Tony Sullivan MRN: [id number] Date of Birth: June 16, 1961 Referring Provider (PT): Davonna Belling MD   Encounter Date: 12/05/2020   PT End of Session - 12/05/20 1306    Visit Number 3    Number of Visits 12    Date for PT Re-Evaluation 12/22/20    Authorization Type Medicaid [place] Time Period 3 approved 2/8-2/21/22    Authorization - Visit Number 1    Authorization - Number of Visits 3    PT Start Time 0258    PT Stop Time 1345    PT Time Calculation (min) 42 min    Equipment Utilized During Treatment Gait belt    Activity Tolerance Patient limited by fatigue;Other (comment)    Behavior During Therapy WFL for tasks assessed/performed           Past Medical History:  Diagnosis Date  . Anxiety   . Arthritis   . COPD (chronic obstructive pulmonary disease) ([place]) 2004   diagnosed in 2004, no PFT's to date.  Started on home O2 12/2013, after found to be desatting at PCP's office, and referred to pulmonology.  . Depression   . GERD (gastroesophageal reflux disease)   . High blood pressure   . Prediabetes   . Seasonal allergies   . Tobacco dependence     Past Surgical History:  Procedure Laterality Date  . ANTERIOR CERVICAL DECOMP/DISCECTOMY FUSION N/A 09/06/2019   Procedure: Anterior Cervical Discecectomy Fusion - Cerivcal Five-Cervical Six;  Surgeon: Kary Kos, MD;  Location: [facility];  Service: Neurosurgery;  Laterality: N/A;  Anterior Cervical Discecectomy Fusion - Cerivcal Five-Cervical Six  . ANTERIOR CERVICAL DISCECTOMY  09/06/2019  . APPENDECTOMY  1980  . COLONOSCOPY WITH PROPOFOL N/A 12/10/2016   Procedure: COLONOSCOPY WITH PROPOFOL;  Surgeon: [name], MD;  Location: [facility];  Service: Endoscopy;  Laterality: N/A;  . HIP SURGERY Right   . SHOULDER  ARTHROSCOPY Right     There were no vitals filed for this visit.   Subjective Assessment - 12/05/20 1307    Subjective Patient says he was a little tight when he woke up today. He has been cusy packing things at his house. He felt good with new exercises last visit. Doing well with HEP.    Pertinent History O 2 dependent, cervical surgery, back fx non surgical, COPD, emphysema    How long can you sit comfortably? no problem    How long can you stand comfortably? has not done this.    How long can you walk comfortably? with walker less than a minute.    Patient Stated Goals to walk more, get out of chair    Currently in Pain? No/denies    Pain Onset More than a month ago                             [facility] Adult PT Treatment/Exercise - 12/05/20 0001      Knee/Hip Exercises: Standing   Heel Raises Both;2 sets;10 reps    Knee Flexion Both;2 sets;10 reps    Hip Flexion Both;2 sets;10 reps    Hip Flexion Limitations marching    Hip Abduction Both;2 sets;10 reps    Forward Step Up Both;2 sets;10 reps;Hand Hold: 2;Step Height: 4"  Other Standing Knee Exercises tandem stance 3 x 30" holds    Other Standing Knee Exercises sidestepping in // bars 5RT      Knee/Hip Exercises: Seated   Sit to Sand 2 sets;10 reps;without UE support                    PT Short Term Goals - 11/28/20 1644      PT SHORT TERM GOAL #1   Title Patient will be independent with initial HEP and self-management strategies to improve functional outcomes    Baseline Reports compliance with HEP    Time 2    Period Weeks    Status Achieved    Target Date 11/24/20             PT Long Term Goals - 11/28/20 1644      PT LONG TERM GOAL #1   Title Patient will be able to perform stand x 5 in < 13 seconds to demonstrate improvement in functional mobility and reduced risk for falls.    Baseline 17 seconds    Time 6    Period Weeks    Status On-going      PT LONG TERM GOAL #2    Title Patient will be able to ambulate at least 250 feet during 2MWT with LRAD to demonstrate improved ability to perform functional mobility and associated tasks.    Baseline 175 feet with SPC    Time 6    Period Weeks    Status On-going      PT LONG TERM GOAL #3   Title Patient will be able to stand >5 minutes for improved ability to perform cooking/ grooming/ cleaning ADLs.    Baseline Current 2-3 minutes    Time 6    Period Weeks    Status On-going                 Plan - 12/05/20 1344    Clinical Impression Statement increased rest breaks due to fatigue. Patient using O2 at 3L throughout session, was able to maintain SpO2 above 87% at lowest point, but increases with periodic rest breaks and pursed lip breathing. Patient cued on proper form and function with all added exercise today. Patient will continue to benefit form skilled therapy services to progress LE strength and activity tolerance for improved functional mobility and ability to perform ADLs.    Personal Factors and Comorbidities Comorbidity 3+    Examination-Activity Limitations Bathing;Bed Mobility;Locomotion Level;Dressing;Stand;Stairs;Hygiene/Grooming;Transfers    Examination-Participation Restrictions Community Activity;Meal Prep;Cleaning    Stability/Clinical Decision Making Stable/Uncomplicated    Rehab Potential Fair    PT Frequency 2x / week    PT Duration 6 weeks    PT Treatment/Interventions Gait training;Patient/family education;Functional mobility training;Passive range of motion;Therapeutic exercise;Electrical Stimulation;Neuromuscular re-education;Balance training;Manual techniques;Therapeutic activities    PT Next Visit Plan Progress LE and core strenghening to tolerance. Continue standing exercise. Monitor perceived exetion and SpO2 with activity. Increase ambualtion distance to tolerance    PT Home Exercise Plan Eval: heel toe raises, LAQs, seated marching 11/28/20 heel raise, sit to stand, marching,  tandem stance    Consulted and Agree with Plan of Care Patient           Patient will benefit from skilled therapeutic intervention in order to improve the following deficits and impairments:  Abnormal gait,Difficulty walking,Decreased strength,Decreased balance,Decreased activity tolerance,Cardiopulmonary status limiting activity,Hypomobility,Decreased endurance,Pain  Visit Diagnosis: Muscle weakness (generalized)  Other abnormalities of gait and mobility  Problem List Patient Active Problem List   Diagnosis Date Noted  . Hyperlipidemia 05/24/2020  . Hypokalemia 05/24/2020  . Chest pain 05/23/2020  . Bipolar disorder ([place]) 05/23/2020  . HNP (herniated nucleus pulposus), cervical 09/06/2019  . Abdominal pain   . Poor dentition 11/04/2017  . Acute on chronic respiratory failure with hypoxia ([place]) 08/29/2017  . ETOH abuse 03/30/2017  . Cigarette smoker 03/30/2017  . Acute respiratory failure with hypoxia and hypercapnia ([place]) 03/29/2017  . Special screening for malignant neoplasms, colon   . Benign neoplasm of ascending colon   . Benign neoplasm of descending colon   . Hoarseness 08/22/2016  . GERD (gastroesophageal reflux disease) 08/22/2016  . Atypical chest pain 12/05/2015  . Essential hypertension 06/15/2014  . Pectoralis muscle strain 01/11/2014  . COPD with acute exacerbation ([place]) 01/10/2014  . COPD  GOLD III 01/08/2014  . Smoker 01/08/2014  . Cough 01/08/2014  . Chronic respiratory failure with hypoxia ([place]) 01/08/2014    1:48 PM, 12/05/20 Josue Hector PT DPT  Physical Therapist with [facility]  [phone] [place] [address] [place], [place], [address] Phone: [phone]   Fax:  [phone]  Name: KAITLYN SKOWRON MRN: [id number] Date of Birth: [birth year]

## 2020-12-06 ENCOUNTER — Other Ambulatory Visit: Payer: Self-pay

## 2020-12-06 ENCOUNTER — Encounter (HOSPITAL_COMMUNITY): Payer: Self-pay

## 2020-12-06 ENCOUNTER — Emergency Department (HOSPITAL_COMMUNITY)
Admission: EM | Admit: 2020-12-06 | Discharge: 2020-12-06 | Disposition: A | Discharge status: Home / Self Care | Payer: Medicaid Other | Attending: Emergency Medicine | Admitting: Emergency Medicine

## 2020-12-06 ENCOUNTER — Emergency Department (HOSPITAL_COMMUNITY): Payer: Medicaid Other

## 2020-12-06 DIAGNOSIS — R519 Headache, unspecified: Secondary | ICD-10-CM | POA: Insufficient documentation

## 2020-12-06 DIAGNOSIS — R0602 Shortness of breath: Secondary | ICD-10-CM | POA: Diagnosis present

## 2020-12-06 DIAGNOSIS — Z20822 Contact with and (suspected) exposure to covid-19: Secondary | ICD-10-CM | POA: Insufficient documentation

## 2020-12-06 DIAGNOSIS — J441 Chronic obstructive pulmonary disease with (acute) exacerbation: Secondary | ICD-10-CM | POA: Diagnosis not present

## 2020-12-06 DIAGNOSIS — F17210 Nicotine dependence, cigarettes, uncomplicated: Secondary | ICD-10-CM | POA: Diagnosis not present

## 2020-12-06 DIAGNOSIS — I10 Essential (primary) hypertension: Secondary | ICD-10-CM | POA: Insufficient documentation

## 2020-12-06 DIAGNOSIS — Z79899 Other long term (current) drug therapy: Secondary | ICD-10-CM | POA: Insufficient documentation

## 2020-12-06 LAB — CBC WITH DIFFERENTIAL/PLATELET
Abs Immature Granulocytes: 0.1 10*3/uL — ABNORMAL HIGH (ref 0.00–0.07)
Basophils Absolute: 0 10*3/uL (ref 0.0–0.1)
Basophils Relative: 0 %
Eosinophils Absolute: 0 10*3/uL (ref 0.0–0.5)
Eosinophils Relative: 0 %
HCT: 47.9 % (ref 39.0–52.0)
Hemoglobin: 14.9 g/dL (ref 13.0–17.0)
Immature Granulocytes: 1 %
Lymphocytes Relative: 10 %
Lymphs Abs: 1.2 10*3/uL (ref 0.7–4.0)
MCH: 31.2 pg (ref 26.0–34.0)
MCHC: 31.1 g/dL (ref 30.0–36.0)
MCV: 100.2 fL — ABNORMAL HIGH (ref 80.0–100.0)
Monocytes Absolute: 0.5 10*3/uL (ref 0.1–1.0)
Monocytes Relative: 4 %
Neutro Abs: 10.4 10*3/uL — ABNORMAL HIGH (ref 1.7–7.7)
Neutrophils Relative %: 85 %
Platelets: 238 10*3/uL (ref 150–400)
RBC: 4.78 MIL/uL (ref 4.22–5.81)
RDW: 14.9 % (ref 11.5–15.5)
WBC: 12.2 10*3/uL — ABNORMAL HIGH (ref 4.0–10.5)
nRBC: 0 % (ref 0.0–0.2)

## 2020-12-06 LAB — BASIC METABOLIC PANEL
Anion gap: 8 (ref 5–15)
BUN: 19 mg/dL (ref 6–20)
CO2: 28 mmol/L (ref 22–32)
Calcium: 9.1 mg/dL (ref 8.9–10.3)
Chloride: 102 mmol/L (ref 98–111)
Creatinine, Ser: 0.96 mg/dL (ref 0.61–1.24)
GFR, Estimated: >60 mL/min (ref >60)
Glucose, Bld: 91 mg/dL (ref 70–99)
Potassium: 3.3 mmol/L — ABNORMAL LOW (ref 3.5–5.1)
Sodium: 138 mmol/L (ref 135–145)

## 2020-12-06 LAB — TROPONIN I (HIGH SENSITIVITY): Troponin I (High Sensitivity): 10 ng/L (ref <18)

## 2020-12-06 LAB — BLOOD GAS, VENOUS
Acid-Base Excess: 5.6 mmol/L — ABNORMAL HIGH (ref 0.0–2.0)
Bicarbonate: 25.8 mmol/L (ref 20.0–28.0)
FIO2: 100
O2 Saturation: 51.1 %
Patient temperature: 36.9
pCO2, Ven: 71.6 mmHg (ref 44.0–60.0)
pH, Ven: 7.274 (ref 7.250–7.430)
pO2, Ven: 31.2 mmHg — CL (ref 32.0–45.0)

## 2020-12-06 LAB — SARS CORONAVIRUS 2 (TAT 6-24 HRS): SARS Coronavirus 2: NEGATIVE

## 2020-12-06 LAB — POC SARS CORONAVIRUS 2 AG -  ED: SARS Coronavirus 2 Ag: NEGATIVE

## 2020-12-06 IMAGING — DX DG CHEST 1V PORT
1 series · 1 of 1 positions shown · non-contrast
Comparison: [DATE]

CLINICAL DATA: Dyspnea, COPD exacerbation

EXAM:
PORTABLE CHEST 1 VIEW

[chest ap]
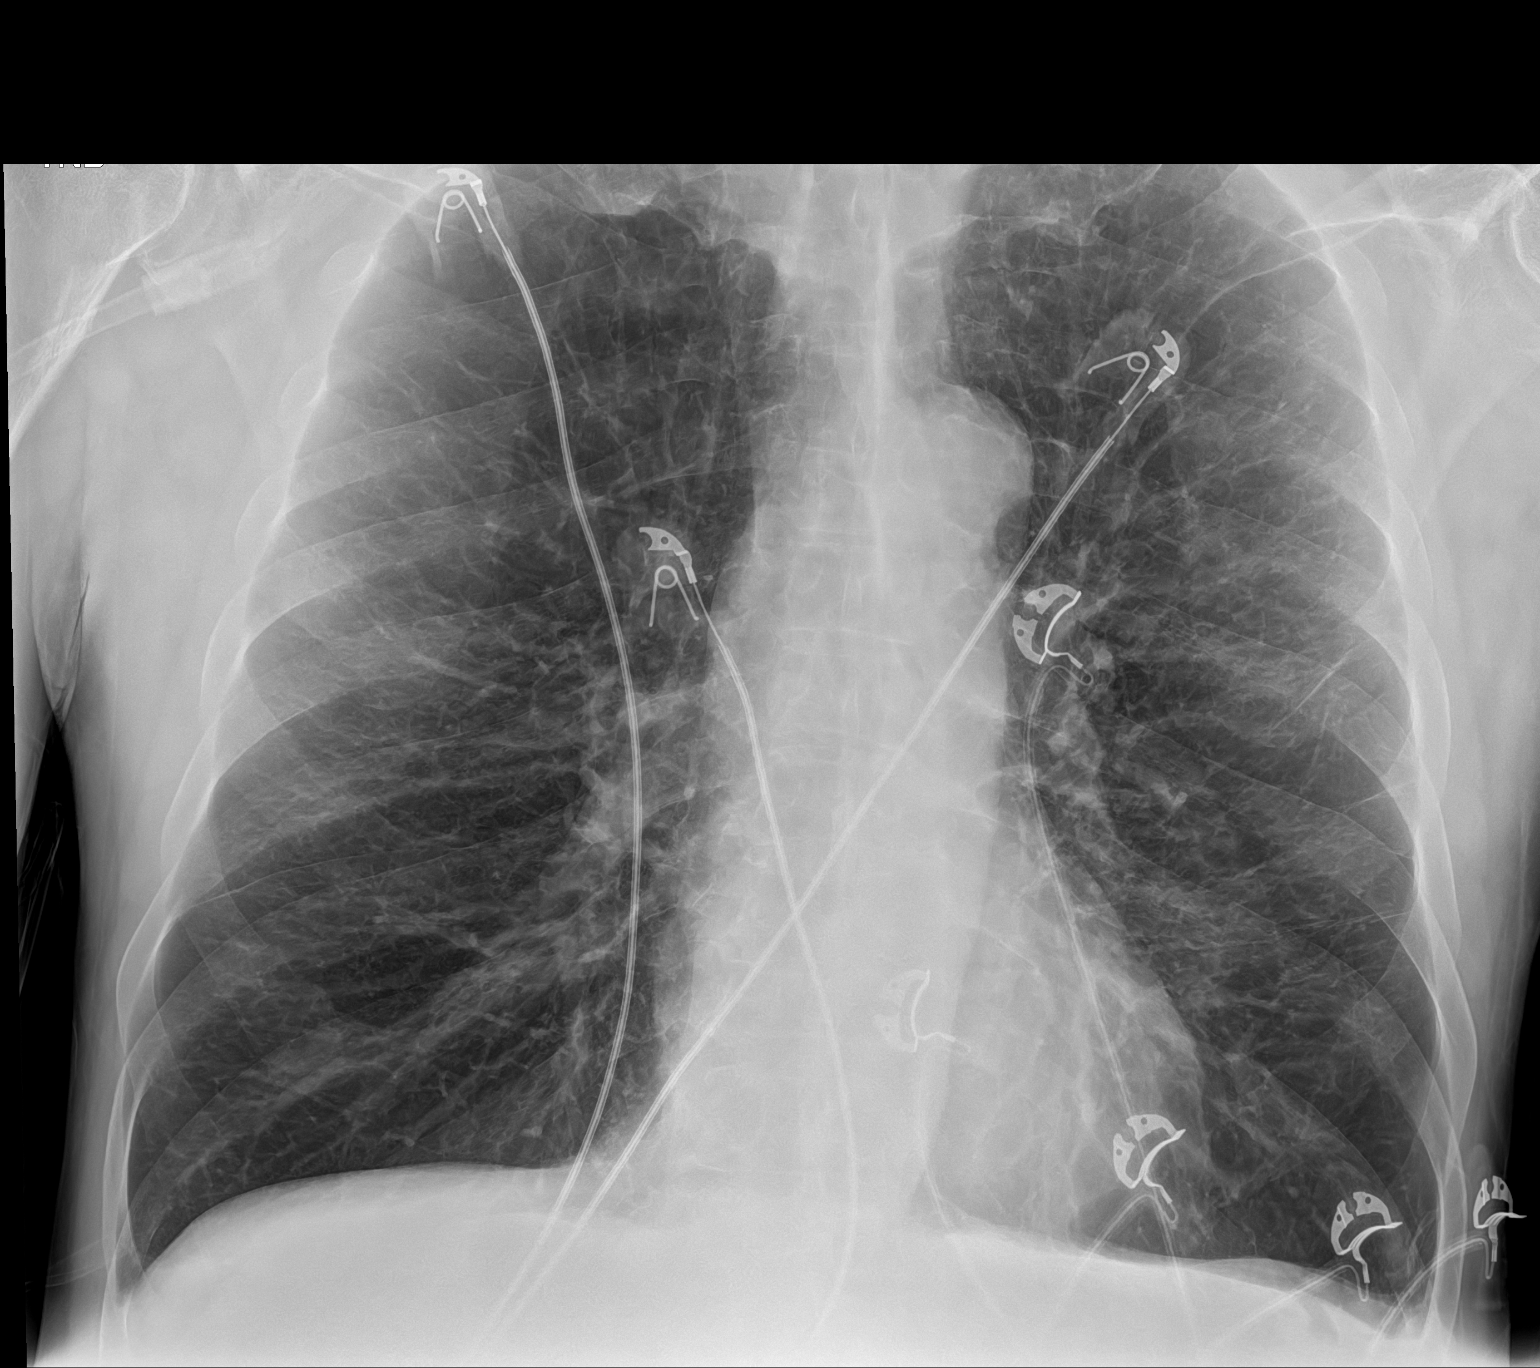

[1 of 1 positions shown; findings below may reference images not displayed]

FINDINGS: The lungs are symmetrically hyperinflated in keeping with changes of
underlying COPD. Coarsening of the interstitium is again seen in
keeping with changes of underlying interstitial lung disease, better
assessed on prior CT examination of [DATE]. No superimposed
focal pulmonary infiltrate. No pneumothorax or pleural effusion.
Cardiac size within normal limits. Pulmonary vascularity is normal.
No acute bone abnormality.
IMPRESSION: Stable pulmonary hyperinflation, in keeping with COPD, and chronic
interstitial changes.

## 2020-12-06 MED ORDER — IPRATROPIUM-ALBUTEROL 0.5-2.5 (3) MG/3ML IN SOLN
3.0000 mL | Freq: Two times a day (BID) | RESPIRATORY_TRACT | Status: DC | PRN
  Administered 2020-12-06: 3 mL via RESPIRATORY_TRACT
  Filled 2020-12-06: qty 3

## 2020-12-06 MED ORDER — ALBUTEROL SULFATE HFA 108 (90 BASE) MCG/ACT IN AERS
2.0000 | INHALATION_SPRAY | Freq: Once | RESPIRATORY_TRACT | Status: AC
  Administered 2020-12-06: 2 via RESPIRATORY_TRACT
  Filled 2020-12-06: qty 6.7

## 2020-12-06 NOTE — ED Provider Notes (Signed)
[facility] EMERGENCY DEPARTMENT Provider Note   CSN: [id number] Arrival date & time: 12/06/20  1022     History Chief Complaint  Patient presents with  . Shortness of Breath    Tony Sullivan is a 60 y.o. male with a history of COPD on 3 L nasal cannula baseline presenting to the emergency department with shortness of breath.  He reports onset of symptoms 2 days ago.  He has had some chest tightness, shortness of breath, cough, sore throat, headache, fatigue.  He has had both Covid vaccines and a booster but is concerned that he was exposed to several people who had Covid over the weekend.  He called Dr. [name] his pulmonologist who prescribed him azithromycin which has been taking for 1 day.  He is also on prednisone 20 mg currently.  He has been taking nebulizers at home, but did not take any this morning yet.  He does feel more short of breath than normal.  HPI     Past Medical History:  Diagnosis Date  . Anxiety   . Arthritis   . COPD (chronic obstructive pulmonary disease) ([place]) 2004   diagnosed in 2004, no PFT's to date.  Started on home O2 12/2013, after found to be desatting at PCP's office, and referred to pulmonology.  . Depression   . GERD (gastroesophageal reflux disease)   . High blood pressure   . Prediabetes   . Seasonal allergies   . Tobacco dependence     Patient Active Problem List   Diagnosis Date Noted  . Hyperlipidemia 05/24/2020  . Hypokalemia 05/24/2020  . Chest pain 05/23/2020  . Bipolar disorder ([place]) 05/23/2020  . HNP (herniated nucleus pulposus), cervical 09/06/2019  . Abdominal pain   . Poor dentition 11/04/2017  . Acute on chronic respiratory failure with hypoxia ([place]) 08/29/2017  . ETOH abuse 03/30/2017  . Cigarette smoker 03/30/2017  . Acute respiratory failure with hypoxia and hypercapnia ([place]) 03/29/2017  . Special screening for malignant neoplasms, colon   . Benign neoplasm of ascending colon   . Benign neoplasm of descending colon   .  Hoarseness 08/22/2016  . GERD (gastroesophageal reflux disease) 08/22/2016  . Atypical chest pain 12/05/2015  . Essential hypertension 06/15/2014  . Pectoralis muscle strain 01/11/2014  . COPD with acute exacerbation ([place]) 01/10/2014  . COPD  GOLD III 01/08/2014  . Smoker 01/08/2014  . Cough 01/08/2014  . Chronic respiratory failure with hypoxia ([place]) 01/08/2014    Past Surgical History:  Procedure Laterality Date  . ANTERIOR CERVICAL DECOMP/DISCECTOMY FUSION N/A 09/06/2019   Procedure: Anterior Cervical Discecectomy Fusion - Cerivcal Five-Cervical Six;  Surgeon: Kary Kos, MD;  Location: [facility];  Service: Neurosurgery;  Laterality: N/A;  Anterior Cervical Discecectomy Fusion - Cerivcal Five-Cervical Six  . ANTERIOR CERVICAL DISCECTOMY  09/06/2019  . APPENDECTOMY  1980  . COLONOSCOPY WITH PROPOFOL N/A 12/10/2016   Procedure: COLONOSCOPY WITH PROPOFOL;  Surgeon: [name], MD;  [facility];  Service: Endoscopy;  Laterality: N/A;  . HIP SURGERY Right   . SHOULDER ARTHROSCOPY Right        Family History  Problem Relation Age of Onset  . Emphysema Mother   . Allergies Mother        "everyone in family"  . Asthma Mother        "everyone in family"  . Heart disease Mother   . Clotting disorder Mother   . Cancer Mother   . Emphysema Father   . Allergies Father   .  Allergies Son   . Seizures Brother   . Diabetes Brother     Social History   Tobacco Use  . Smoking status: Current Some Day Smoker    Packs/day: 0.00    Years: 36.00    Pack years: 0.00    Types: Cigarettes  . Smokeless tobacco: Never Used  . Tobacco comment: smokes 2 cigarettes per day 11/06/2020  Vaping Use  . Vaping Use: Never used  Substance Use Topics  . Alcohol use: Not Currently    Alcohol/week: 14.0 - 21.0 standard drinks    Types: 14 - 21 Standard drinks or equivalent per week    Comment: 2-3 beers/day, with occasional "binges".  No history of withdrawal  . Drug use: Not Currently     Types: Marijuana, Cocaine    Comment: as of November has not used for a month    Home Medications Prior to Admission medications   Medication Sig Start Date End Date Taking? Authorizing Provider  albuterol (PROAIR HFA) 108 (90 Base) MCG/ACT inhaler Inhale 2 puffs into the lungs every 4 (four) hours as needed. Patient taking differently: Inhale 2 puffs into the lungs every 4 (four) hours as needed for wheezing or shortness of breath. 11/24/20  Yes [name], MD  albuterol (PROVENTIL) (2.5 MG/3ML) 0.083% nebulizer solution Take 3 mLs (2.5 mg total) by nebulization every 4 (four) hours as needed for wheezing or shortness of breath. 04/28/18  Yes [name], NP  atorvastatin (LIPITOR) 40 MG tablet TAKE ONE TABLET BY MOUTH DAILY Patient taking differently: Take 40 mg by mouth daily. 07/06/20  Yes [name], MD  azithromycin (ZITHROMAX) 250 MG tablet Take 2 tablets first day then 1 tablet daily until finished. 12/04/20  Yes [name], MD  benztropine (COGENTIN) 0.5 MG tablet Take 0.5 mg by mouth at bedtime.    Yes [provider]  budesonide-formoterol (SYMBICORT) 160-4.5 MCG/ACT inhaler INHALE TWO PUFFS BY MOUTH INTO THE LUNGS TWICE A DAY Patient taking differently: Inhale 2 puffs into the lungs in the morning and at bedtime. 11/06/20  Yes [name], MD  cetirizine (ZYRTEC) 10 MG tablet Take 10 mg by mouth daily.   Yes [provider]  diclofenac Sodium (VOLTAREN) 1 % GEL Apply 2 g topically 4 (four) times daily.   Yes [provider]  famotidine (PEPCID) 20 MG tablet Take 1 tablet (20 mg total) by mouth at bedtime. 10/09/20  Yes [name], MD  fluticasone (FLONASE) 50 MCG/ACT nasal spray Place 2 sprays into both nostrils 3 (three) times daily as needed for allergies.    Yes [provider]  hydrochlorothiazide (MICROZIDE) 12.5 MG capsule TAKE ONE CAPSULE BY MOUTH EVERY MORNING Patient taking differently: Take 12.5 mg by mouth in the  morning. 05/16/20  Yes Tolia, Sunit, DO  latanoprost (XALATAN) 0.005 % ophthalmic solution Place 1 drop into both eyes at bedtime. 05/24/19  Yes [provider]  losartan (COZAAR) 100 MG tablet Take 100 mg by mouth daily.   Yes [provider]  loxapine (LOXITANE) 10 MG capsule Take 10 mg by mouth at bedtime.    Yes [provider]  megestrol (MEGACE) 40 MG tablet Take 40 mg by mouth daily.   Yes [provider]  mirtazapine (REMERON) 30 MG tablet Take 30 mg by mouth at bedtime.   Yes [provider]  Multiple Vitamin (MULTIVITAMIN) capsule Take 1 capsule by mouth daily.   Yes [provider]  OXYGEN Inhale  2 L into the lungs as directed. 2-3 liters with sleep and exertion as needed for low oxygen   Yes [provider]  pantoprazole (PROTONIX) 40 MG tablet Take 40 mg by mouth daily.   Yes [provider]  predniSONE (DELTASONE) 10 MG tablet TAKE TWO TABLETS (20 MG TOTAL) BY MOUTH DAILY WITH BREAKFAST. PATIENT TAKES 10MG  EVERY OTHER DAY, AND 20 MG ON OTHER DAYS. Patient taking differently: Take 20 mg by mouth daily. 10/09/20  Yes [name], MD  risperiDONE (RISPERDAL) 2 MG tablet Take 2 mg by mouth at bedtime.   Yes [provider]  Tiotropium Bromide Monohydrate (SPIRIVA RESPIMAT) 2.5 MCG/ACT AERS Inhale 2 puffs into the lungs daily. 11/06/20  Yes [name], MD  polyethylene glycol (MIRALAX / GLYCOLAX) 17 g packet TAKE 17 GRAMS BY MOUTH DAILY Patient not taking: Reported on 12/06/2020 03/09/20   [name], MD    Allergies    Penicillins and Levocetirizine  Review of Systems   Review of Systems  Constitutional: Negative for chills and fever.  HENT: Positive for congestion and sore throat.   Eyes: Negative for pain and visual disturbance.  Respiratory: Positive for shortness of breath. Negative for cough.   Cardiovascular: Negative for chest pain and palpitations.  Gastrointestinal: Negative for  abdominal pain and vomiting.  Genitourinary: Negative for dysuria and hematuria.  Musculoskeletal: Negative for arthralgias and back pain.  Skin: Negative for color change and rash.  Neurological: Positive for headaches. Negative for syncope and light-headedness.  All other systems reviewed and are negative.   Physical Exam Updated Vital Signs BP 104/75 (BP Location: Right Arm)   Pulse 87   Temp 98.3 F (36.8 C) (Oral)   Resp 18   Ht 5\' 11"  (1.803 m)   Wt 70.3 kg   SpO2 100%   BMI 21.62 kg/m   Physical Exam Constitutional:      General: He is not in acute distress. HENT:     Head: Normocephalic and atraumatic.  Eyes:     Conjunctiva/sclera: Conjunctivae normal.     Pupils: Pupils are equal, round, and reactive to light.  Cardiovascular:     Rate and Rhythm: Normal rate and regular rhythm.  Pulmonary:     Effort: Pulmonary effort is normal.     Comments: Poor air movement bilaterally Faint wheezing 98% on 3L [place], speaking in full sentences Abdominal:     General: There is no distension.     Tenderness: There is no abdominal tenderness.  Skin:    General: Skin is warm and dry.  Neurological:     General: No focal deficit present.     Mental Status: He is alert. Mental status is at baseline.  Psychiatric:        Mood and Affect: Mood normal.        Behavior: Behavior normal.     ED Results / Procedures / Treatments   Labs (all labs ordered are listed, but only abnormal results are displayed) Labs Reviewed  BLOOD GAS, VENOUS - Abnormal; Notable for the following components:      Result Value   pCO2, Ven 71.6 (*)    pO2, Ven 31.2 (*)    Acid-Base Excess 5.6 (*)    All other components within normal limits  BASIC METABOLIC PANEL - Abnormal; Notable for the following components:   Potassium 3.3 (*)    All other components within normal limits  CBC WITH DIFFERENTIAL/PLATELET - Abnormal; Notable for the following components:  WBC 12.2 (*)    MCV 100.2 (*)     Neutro Abs 10.4 (*)    Abs Immature Granulocytes 0.10 (*)    All other components within normal limits  SARS CORONAVIRUS 2 (TAT 6-24 HRS)  POC SARS CORONAVIRUS 2 AG -  ED  TROPONIN I (HIGH SENSITIVITY)    EKG EKG Interpretation  Date/Time:  Wednesday December 06 2020 10:41:35 EST Ventricular Rate:  81 PR Interval:    QRS Duration: 85 QT Interval:  380 QTC Calculation: 442 R Axis:   -85 Text Interpretation: Sinus rhythm Right atrial enlargement Left anterior fascicular block Poor baseline V 1 No STEMI  evident Confirmed by Octaviano Glow [phone]) on 12/06/2020 10:56:04 AM   Radiology DG Chest Portable 1 View  Result Date: 12/06/2020 CLINICAL DATA:  Dyspnea, COPD exacerbation EXAM: PORTABLE CHEST 1 VIEW COMPARISON:  10/02/2020 FINDINGS: The lungs are symmetrically hyperinflated in keeping with changes of underlying COPD. Coarsening of the interstitium is again seen in keeping with changes of underlying interstitial lung disease, better assessed on prior CT examination of 05/12/2018. No superimposed focal pulmonary infiltrate. No pneumothorax or pleural effusion. Cardiac size within normal limits. Pulmonary vascularity is normal. No acute bone abnormality. IMPRESSION: Stable pulmonary hyperinflation, in keeping with COPD, and chronic interstitial changes. Electronically Signed   By: Fidela Salisbury MD   On: 12/06/2020 12:12    Procedures Procedures   Medications Ordered in ED Medications  ipratropium-albuterol (DUONEB) 0.5-2.5 (3) MG/3ML nebulizer solution 3 mL (3 mLs Nebulization Given 12/06/20 1209)  albuterol (VENTOLIN HFA) 108 (90 Base) MCG/ACT inhaler 2 puff (2 puffs Inhalation Given 12/06/20 1346)    ED Course  I have reviewed the triage vital signs and the nursing notes.  Pertinent labs & imaging results that were available during my care of the patient were reviewed by me and considered in my medical decision making (see chart for details).  Shortness of breath - suspect likely  COPD exacerbation.  VBG demonstrates some pCO2 retention without acidosis.  He may have some chronic retention of CO2 - Trop 10 with nonischemic ECG - less likely ACS.  Likewise lower suspcion for PE at this time - rapid covid test negative.  We'll send a PCR to confirm.  His sore throat and headache suggest a viral syndrome, even if it's not covid - Duonebs given here with improvement symptomatically on reassessment. See ED course below  - Labs otherwise personally reviewed & near baseline values.  WBC mildly elevated at 12.2 in setting of chronic steroid use.  Lower suspicion for bacterial infection  DG chest ordered and reviewed showing hyperinflated lungs, no evident PNA   Clinical Course as of 12/06/20 1802  Wed Dec 06, 2020  1330 Patient is feeling sounding much better after his nebulizer treatments.  He wants to go home.  I think is a reasonable option.  Advised him to give himself DuoNeb treatments every 6 hours for the next 2 days, and to call Dr. Gustavus Bryant office tomorrow for follow up. [MT]    Clinical Course User Index [MT] Loren Vicens, Carola Rhine, MD   Final Clinical Impression(s) / ED Diagnoses Final diagnoses:  COPD exacerbation [facility])    Rx / DC Orders ED Discharge Orders    None       Taejon Irani, Carola Rhine, MD 12/06/20 [phone]

## 2020-12-06 NOTE — ED Notes (Signed)
Date and time results received: 12/06/20  1257(use smartphrase ".now" to insert current time)  Test: pCO2/pO2 Critical Value:pCO2 71.6/pO2 31.2 Name of Provider Notified: Dr Langston Masker Orders Received? Or Actions Taken?:NA

## 2020-12-06 NOTE — Discharge Instructions (Addendum)
For the next 2 days, give yourself nebulizer breathing treatments every 6 hours at home, including when you get home today.  Call Dr. Gustavus Bryant office tomorrow to schedule a follow up appointment.

## 2020-12-06 NOTE — ED Triage Notes (Signed)
Pt presents to ED with complaints of SOB and tightness in chest since Saturday. Pt states his cough has gotten worse also.

## 2020-12-06 NOTE — ED Notes (Signed)
RT aware of nebs needed. Pt comfortable at this time

## 2020-12-06 NOTE — ED Notes (Signed)
Pt states she would like to stay in his his wheelchair rather than getting on the ED stretcher. He is placed on the tele monitor and allowed to stay in the chair for now. He also refuses to get into a gown. Warm blanket given at this time.

## 2020-12-07 ENCOUNTER — Encounter (HOSPITAL_COMMUNITY): Payer: Self-pay | Admitting: Physical Therapy

## 2020-12-07 ENCOUNTER — Other Ambulatory Visit: Payer: Self-pay

## 2020-12-07 ENCOUNTER — Ambulatory Visit (HOSPITAL_COMMUNITY): Payer: Medicaid Other | Admitting: Physical Therapy

## 2020-12-07 DIAGNOSIS — M6281 Muscle weakness (generalized): Secondary | ICD-10-CM

## 2020-12-07 DIAGNOSIS — R2689 Other abnormalities of gait and mobility: Secondary | ICD-10-CM

## 2020-12-07 NOTE — Therapy (Signed)
[place], [place], [address] Phone: [phone]   Fax:  [phone]  Physical Therapy Treatment  Patient Details  Name: Tony Sullivan MRN: [id number] Date of Birth: 1960/11/08 Referring Provider (PT): Davonna Belling MD   Encounter Date: 12/07/2020   PT End of Session - 12/07/20 1310    Visit Number 4    Number of Visits 12    Date for PT Re-Evaluation 12/22/20    Authorization Type Medicaid [place] Time Period 3 approved 2/8-2/21/22    Authorization - Visit Number 2    Authorization - Number of Visits 3    PT Start Time 9937    PT Stop Time 1345    PT Time Calculation (min) 40 min    Equipment Utilized During Treatment Gait belt    Activity Tolerance Patient limited by fatigue;Other (comment)    Behavior During Therapy WFL for tasks assessed/performed           Past Medical History:  Diagnosis Date  . Anxiety   . Arthritis   . COPD (chronic obstructive pulmonary disease) ([place]) 2004   diagnosed in 2004, no PFT's to date.  Started on home O2 12/2013, after found to be desatting at PCP's office, and referred to pulmonology.  . Depression   . GERD (gastroesophageal reflux disease)   . High blood pressure   . Prediabetes   . Seasonal allergies   . Tobacco dependence     Past Surgical History:  Procedure Laterality Date  . ANTERIOR CERVICAL DECOMP/DISCECTOMY FUSION N/A 09/06/2019   Procedure: Anterior Cervical Discecectomy Fusion - Cerivcal Five-Cervical Six;  Surgeon: Kary Kos, MD;  Location: [facility];  Service: Neurosurgery;  Laterality: N/A;  Anterior Cervical Discecectomy Fusion - Cerivcal Five-Cervical Six  . ANTERIOR CERVICAL DISCECTOMY  09/06/2019  . APPENDECTOMY  1980  . COLONOSCOPY WITH PROPOFOL N/A 12/10/2016   Procedure: COLONOSCOPY WITH PROPOFOL;  Surgeon: [name], MD;  Location: [facility];  Service: Endoscopy;  Laterality: N/A;  . HIP SURGERY Right   . SHOULDER  ARTHROSCOPY Right     There were no vitals filed for this visit.   Subjective Assessment - 12/07/20 1309    Subjective Patient says he was having some chest tightness yesterday so he went to ER. They said he was doing too much too fast. They gave him a breathing treatment and released him. He is feeling better today, a little weak. He says his chest is just a little tight, no pain currently.    Pertinent History O 2 dependent, cervical surgery, back fx non surgical, COPD, emphysema    How long can you sit comfortably? no problem    How long can you stand comfortably? has not done this.    How long can you walk comfortably? with walker less than a minute.    Patient Stated Goals to walk more, get out of chair    Currently in Pain? No/denies    Pain Onset More than a month ago                             [facility] Adult PT Treatment/Exercise - 12/07/20 0001      Knee/Hip Exercises: Standing   Heel Raises Both;2 sets;10 reps    Knee Flexion Both;2 sets;10 reps    Hip Flexion Both;2 sets;10 reps    Hip Flexion Limitations marching    Hip Abduction  Both;2 sets;10 reps    Gait Training 100 feet with SPC      Knee/Hip Exercises: Seated   Other Seated Knee/Hip Exercises diaphragm breathing 3 min EOS                    PT Short Term Goals - 11/28/20 1644      PT SHORT TERM GOAL #1   Title Patient will be independent with initial HEP and self-management strategies to improve functional outcomes    Baseline Reports compliance with HEP    Time 2    Period Weeks    Status Achieved    Target Date 11/24/20             PT Long Term Goals - 11/28/20 1644      PT LONG TERM GOAL #1   Title Patient will be able to perform stand x 5 in < 13 seconds to demonstrate improvement in functional mobility and reduced risk for falls.    Baseline 17 seconds    Time 6    Period Weeks    Status On-going      PT LONG TERM GOAL #2   Title Patient will be able to ambulate  at least 250 feet during 2MWT with LRAD to demonstrate improved ability to perform functional mobility and associated tasks.    Baseline 175 feet with SPC    Time 6    Period Weeks    Status On-going      PT LONG TERM GOAL #3   Title Patient will be able to stand >5 minutes for improved ability to perform cooking/ grooming/ cleaning ADLs.    Baseline Current 2-3 minutes    Time 6    Period Weeks    Status On-going                 Plan - 12/07/20 1417    Clinical Impression Statement Activity graded per patient tolerance today. Patient noting increased fatigue and required longer rest breaks. Patient SpO2 maintained above 86% throughout session on 3L O2. Educated patient on diaphragm breathing and practiced at end of session with SpO2 increasing to 96%. Will resume all activity as patient is able.    Personal Factors and Comorbidities Comorbidity 3+    Examination-Activity Limitations Bathing;Bed Mobility;Locomotion Level;Dressing;Stand;Stairs;Hygiene/Grooming;Transfers    Examination-Participation Restrictions Community Activity;Meal Prep;Cleaning    Stability/Clinical Decision Making Stable/Uncomplicated    Rehab Potential Fair    PT Frequency 2x / week    PT Duration 6 weeks    PT Treatment/Interventions Gait training;Patient/family education;Functional mobility training;Passive range of motion;Therapeutic exercise;Electrical Stimulation;Neuromuscular re-education;Balance training;Manual techniques;Therapeutic activities    PT Next Visit Plan Progress LE and core strenghening to tolerance. Continue standing exercise. Monitor perceived exetion and SpO2 with activity. Increase ambualtion distance to tolerance    PT Home Exercise Plan Eval: heel toe raises, LAQs, seated marching 11/28/20 heel raise, sit to stand, marching, tandem stance    Consulted and Agree with Plan of Care Patient           Patient will benefit from skilled therapeutic intervention in order to improve the  following deficits and impairments:  Abnormal gait,Difficulty walking,Decreased strength,Decreased balance,Decreased activity tolerance,Cardiopulmonary status limiting activity,Hypomobility,Decreased endurance,Pain  Visit Diagnosis: Muscle weakness (generalized)  Other abnormalities of gait and mobility     Problem List Patient Active Problem List   Diagnosis Date Noted  . Hyperlipidemia 05/24/2020  . Hypokalemia 05/24/2020  . Chest pain 05/23/2020  . Bipolar disorder ([place]) 05/23/2020  .  HNP (herniated nucleus pulposus), cervical 09/06/2019  . Abdominal pain   . Poor dentition 11/04/2017  . Acute on chronic respiratory failure with hypoxia ([place]) 08/29/2017  . ETOH abuse 03/30/2017  . Cigarette smoker 03/30/2017  . Acute respiratory failure with hypoxia and hypercapnia ([place]) 03/29/2017  . Special screening for malignant neoplasms, colon   . Benign neoplasm of ascending colon   . Benign neoplasm of descending colon   . Hoarseness 08/22/2016  . GERD (gastroesophageal reflux disease) 08/22/2016  . Atypical chest pain 12/05/2015  . Essential hypertension 06/15/2014  . Pectoralis muscle strain 01/11/2014  . COPD with acute exacerbation ([place]) 01/10/2014  . COPD  GOLD III 01/08/2014  . Smoker 01/08/2014  . Cough 01/08/2014  . Chronic respiratory failure with hypoxia ([place]) 01/08/2014    2:23 PM, 12/07/20 Josue Hector PT DPT  Physical Therapist with [facility]  [phone] [place] [address] [place], [place], [address] Phone: [phone]   Fax:  [phone]  Name: Tony Sullivan MRN: [id number] Date of Birth: 04/01/1961

## 2020-12-11 ENCOUNTER — Other Ambulatory Visit: Payer: Self-pay | Admitting: Cardiology

## 2020-12-11 DIAGNOSIS — I10 Essential (primary) hypertension: Secondary | ICD-10-CM

## 2020-12-12 ENCOUNTER — Ambulatory Visit (HOSPITAL_COMMUNITY): Payer: Medicaid Other

## 2020-12-12 ENCOUNTER — Telehealth (HOSPITAL_COMMUNITY): Payer: Self-pay

## 2020-12-12 NOTE — Telephone Encounter (Signed)
pt called to cx this appt due to he has another doctor's appt in [place]

## 2020-12-14 ENCOUNTER — Ambulatory Visit (HOSPITAL_COMMUNITY): Payer: Medicaid Other | Admitting: Physical Therapy

## 2020-12-14 ENCOUNTER — Telehealth (HOSPITAL_COMMUNITY): Payer: Self-pay | Admitting: Physical Therapy

## 2020-12-14 NOTE — Telephone Encounter (Signed)
pt called to cx this appt due to he is getting ready to have eye surgery due to bad cataracts

## 2020-12-19 ENCOUNTER — Ambulatory Visit (HOSPITAL_COMMUNITY): Payer: Medicaid Other | Admitting: Physical Therapy

## 2020-12-21 ENCOUNTER — Ambulatory Visit (HOSPITAL_COMMUNITY): Payer: Medicaid Other | Admitting: Physical Therapy

## 2020-12-25 ENCOUNTER — Ambulatory Visit: Payer: Medicaid Other | Admitting: Cardiology

## 2020-12-26 ENCOUNTER — Telehealth (HOSPITAL_COMMUNITY): Payer: Self-pay

## 2020-12-26 ENCOUNTER — Ambulatory Visit (HOSPITAL_COMMUNITY): Payer: Medicaid Other | Attending: Emergency Medicine

## 2020-12-26 NOTE — Telephone Encounter (Signed)
No show, called and left message concerning missed apt today.  Included next apt date and time wiht contact number if needs to call and cancel/reschedule further apts.    Ihor [name], LPTA/CLT; Delana Meyer [phone]

## 2020-12-28 ENCOUNTER — Ambulatory Visit (HOSPITAL_COMMUNITY): Payer: Medicaid Other | Admitting: Physical Therapy

## 2021-01-10 ENCOUNTER — Other Ambulatory Visit: Payer: Self-pay | Admitting: Internal Medicine

## 2021-01-24 ENCOUNTER — Ambulatory Visit: Payer: Medicaid Other | Admitting: Internal Medicine
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
## 2021-01-29 ENCOUNTER — Ambulatory Visit: Payer: Medicaid Other | Admitting: Cardiology

## 2021-01-29 ENCOUNTER — Encounter: Payer: Self-pay | Admitting: Cardiology

## 2021-01-29 ENCOUNTER — Other Ambulatory Visit: Payer: Self-pay

## 2021-01-29 VITALS — BP 122/76 | HR 88 | Resp 16 | Ht 71.0 in | Wt 158.0 lb

## 2021-01-29 DIAGNOSIS — F17200 Nicotine dependence, unspecified, uncomplicated: Secondary | ICD-10-CM

## 2021-01-29 DIAGNOSIS — M7989 Other specified soft tissue disorders: Secondary | ICD-10-CM

## 2021-01-29 DIAGNOSIS — I10 Essential (primary) hypertension: Secondary | ICD-10-CM

## 2021-01-29 MED ORDER — HYDROCHLOROTHIAZIDE 12.5 MG PO CAPS: 12.5000 mg | ORAL_CAPSULE | Freq: Every morning | ORAL | 5 refills | Status: DC

## 2021-01-30 ENCOUNTER — Telehealth: Payer: Self-pay | Admitting: Internal Medicine

## 2021-01-30 NOTE — Telephone Encounter (Signed)
Called and spoke with pt who stated he began to have complaints of chest tightness, cough with clear phlegm, wheezing, and some increased SOB which began 2 days ago. Pt believes this is due to the pollen. Pt said that he usually wears his O2 at 2L but if needed will bump it up to either 3-4L especially in the mornings as they are usually rough for him until he is able to take his meds.  Pt said that he has had to use his rescue inhaler and albuterol neb sol about 3 times daily recently to help with his symptoms. Pt is using his Symbicort and Spiriva inhalers and also taking all other meds as prescribed.  Pt was requesting to have zpak sent to pharmacy as he said that [facility] usually has an Rx on file in case if he has a flare up. Stated to pt that Rx for zpak should be at pharmacy as one was sent to pharmacy for him 12/04/20 with 11 additional refills and he verbalized understanding. Nothing further needed.

## 2021-02-05 ENCOUNTER — Other Ambulatory Visit: Payer: Self-pay | Admitting: Internal Medicine

## 2021-02-15 ENCOUNTER — Encounter (HOSPITAL_COMMUNITY): Payer: Self-pay | Admitting: Emergency Medicine

## 2021-02-15 ENCOUNTER — Emergency Department (HOSPITAL_COMMUNITY): Payer: Medicaid Other

## 2021-02-15 ENCOUNTER — Other Ambulatory Visit: Payer: Self-pay

## 2021-02-15 ENCOUNTER — Emergency Department (HOSPITAL_COMMUNITY)
Admission: EM | Admit: 2021-02-15 | Discharge: 2021-02-15 | Disposition: A | Discharge status: Home / Self Care | Payer: Medicaid Other | Attending: Emergency Medicine | Admitting: Emergency Medicine

## 2021-02-15 ENCOUNTER — Emergency Department (HOSPITAL_COMMUNITY)
Admission: EM | Admit: 2021-02-15 | Discharge: 2021-02-15 | Disposition: A | Discharge status: Home / Self Care | Payer: Medicaid Other | Source: Home / Self Care | Attending: Emergency Medicine | Admitting: Emergency Medicine

## 2021-02-15 DIAGNOSIS — Z20822 Contact with and (suspected) exposure to covid-19: Secondary | ICD-10-CM | POA: Insufficient documentation

## 2021-02-15 DIAGNOSIS — Z79899 Other long term (current) drug therapy: Secondary | ICD-10-CM | POA: Insufficient documentation

## 2021-02-15 DIAGNOSIS — Z7951 Long term (current) use of inhaled steroids: Secondary | ICD-10-CM | POA: Insufficient documentation

## 2021-02-15 DIAGNOSIS — F17210 Nicotine dependence, cigarettes, uncomplicated: Secondary | ICD-10-CM | POA: Insufficient documentation

## 2021-02-15 DIAGNOSIS — F419 Anxiety disorder, unspecified: Secondary | ICD-10-CM | POA: Insufficient documentation

## 2021-02-15 DIAGNOSIS — J441 Chronic obstructive pulmonary disease with (acute) exacerbation: Secondary | ICD-10-CM | POA: Diagnosis not present

## 2021-02-15 DIAGNOSIS — E876 Hypokalemia: Secondary | ICD-10-CM

## 2021-02-15 DIAGNOSIS — Z85038 Personal history of other malignant neoplasm of large intestine: Secondary | ICD-10-CM | POA: Insufficient documentation

## 2021-02-15 DIAGNOSIS — I10 Essential (primary) hypertension: Secondary | ICD-10-CM | POA: Diagnosis not present

## 2021-02-15 DIAGNOSIS — R600 Localized edema: Secondary | ICD-10-CM | POA: Insufficient documentation

## 2021-02-15 DIAGNOSIS — S59911A Unspecified injury of right forearm, initial encounter: Secondary | ICD-10-CM | POA: Diagnosis present

## 2021-02-15 DIAGNOSIS — R0789 Other chest pain: Secondary | ICD-10-CM

## 2021-02-15 DIAGNOSIS — S51811A Laceration without foreign body of right forearm, initial encounter: Secondary | ICD-10-CM | POA: Insufficient documentation

## 2021-02-15 DIAGNOSIS — R0602 Shortness of breath: Secondary | ICD-10-CM | POA: Insufficient documentation

## 2021-02-15 DIAGNOSIS — S40021A Contusion of right upper arm, initial encounter: Secondary | ICD-10-CM

## 2021-02-15 LAB — COMPREHENSIVE METABOLIC PANEL
ALT: 29 U/L (ref 0–44)
AST: 32 U/L (ref 15–41)
Albumin: 3.3 g/dL — ABNORMAL LOW (ref 3.5–5.0)
Alkaline Phosphatase: 61 U/L (ref 38–126)
Anion gap: 8 (ref 5–15)
BUN: 19 mg/dL (ref 6–20)
CO2: 30 mmol/L (ref 22–32)
Calcium: 8.8 mg/dL — ABNORMAL LOW (ref 8.9–10.3)
Chloride: 105 mmol/L (ref 98–111)
Creatinine, Ser: 1.01 mg/dL (ref 0.61–1.24)
GFR, Estimated: >60 mL/min (ref >60)
Glucose, Bld: 93 mg/dL (ref 70–99)
Potassium: 3.1 mmol/L — ABNORMAL LOW (ref 3.5–5.1)
Sodium: 143 mmol/L (ref 135–145)
Total Bilirubin: 0.5 mg/dL (ref 0.3–1.2)
Total Protein: 6.2 g/dL — ABNORMAL LOW (ref 6.5–8.1)

## 2021-02-15 LAB — BASIC METABOLIC PANEL
Anion gap: 8 (ref 5–15)
BUN: 16 mg/dL (ref 6–20)
CO2: 30 mmol/L (ref 22–32)
Calcium: 8.9 mg/dL (ref 8.9–10.3)
Chloride: 103 mmol/L (ref 98–111)
Creatinine, Ser: 0.93 mg/dL (ref 0.61–1.24)
GFR, Estimated: >60 mL/min (ref >60)
Glucose, Bld: 91 mg/dL (ref 70–99)
Potassium: 3.1 mmol/L — ABNORMAL LOW (ref 3.5–5.1)
Sodium: 141 mmol/L (ref 135–145)

## 2021-02-15 LAB — CBC WITH DIFFERENTIAL/PLATELET
Abs Immature Granulocytes: 0.14 10*3/uL — ABNORMAL HIGH (ref 0.00–0.07)
Abs Immature Granulocytes: 0.14 10*3/uL — ABNORMAL HIGH (ref 0.00–0.07)
Basophils Absolute: 0.1 10*3/uL (ref 0.0–0.1)
Basophils Absolute: 0.1 10*3/uL (ref 0.0–0.1)
Basophils Relative: 0 %
Basophils Relative: 1 %
Eosinophils Absolute: 0.1 10*3/uL (ref 0.0–0.5)
Eosinophils Absolute: 0.1 10*3/uL (ref 0.0–0.5)
Eosinophils Relative: 1 %
Eosinophils Relative: 1 %
HCT: 44.3 % (ref 39.0–52.0)
HCT: 47.8 % (ref 39.0–52.0)
Hemoglobin: 14.1 g/dL (ref 13.0–17.0)
Hemoglobin: 15.2 g/dL (ref 13.0–17.0)
Immature Granulocytes: 1 %
Immature Granulocytes: 1 %
Lymphocytes Relative: 19 %
Lymphocytes Relative: 26 %
Lymphs Abs: 3 10*3/uL (ref 0.7–4.0)
Lymphs Abs: 4.3 10*3/uL — ABNORMAL HIGH (ref 0.7–4.0)
MCH: 31.9 pg (ref 26.0–34.0)
MCH: 31.9 pg (ref 26.0–34.0)
MCHC: 31.8 g/dL (ref 30.0–36.0)
MCHC: 31.8 g/dL (ref 30.0–36.0)
MCV: 100.2 fL — ABNORMAL HIGH (ref 80.0–100.0)
MCV: 100.4 fL — ABNORMAL HIGH (ref 80.0–100.0)
Monocytes Absolute: 1.3 10*3/uL — ABNORMAL HIGH (ref 0.1–1.0)
Monocytes Absolute: 1.4 10*3/uL — ABNORMAL HIGH (ref 0.1–1.0)
Monocytes Relative: 8 %
Monocytes Relative: 9 %
Neutro Abs: 11.1 10*3/uL — ABNORMAL HIGH (ref 1.7–7.7)
Neutro Abs: 10.6 10*3/uL — ABNORMAL HIGH (ref 1.7–7.7)
Neutrophils Relative %: 71 %
Neutrophils Relative %: 62 %
Platelets: 269 10*3/uL (ref 150–400)
Platelets: 264 10*3/uL (ref 150–400)
RBC: 4.42 MIL/uL (ref 4.22–5.81)
RBC: 4.76 MIL/uL (ref 4.22–5.81)
RDW: 14.6 % (ref 11.5–15.5)
RDW: 14.6 % (ref 11.5–15.5)
WBC: 15.7 10*3/uL — ABNORMAL HIGH (ref 4.0–10.5)
WBC: 16.6 10*3/uL — ABNORMAL HIGH (ref 4.0–10.5)
nRBC: 0 % (ref 0.0–0.2)
nRBC: 0 % (ref 0.0–0.2)

## 2021-02-15 LAB — BLOOD GAS, VENOUS
Acid-Base Excess: 7.4 mmol/L — ABNORMAL HIGH (ref 0.0–2.0)
Bicarbonate: 29.4 mmol/L — ABNORMAL HIGH (ref 20.0–28.0)
FIO2: 36
O2 Saturation: 84.4 %
Patient temperature: 37
pCO2, Ven: 56.5 mmHg (ref 44.0–60.0)
pH, Ven: 7.379 (ref 7.250–7.430)
pO2, Ven: 50.9 mmHg — ABNORMAL HIGH (ref 32.0–45.0)

## 2021-02-15 LAB — TROPONIN I (HIGH SENSITIVITY)
Troponin I (High Sensitivity): 26 ng/L — ABNORMAL HIGH (ref <18)
Troponin I (High Sensitivity): 28 ng/L — ABNORMAL HIGH (ref <18)

## 2021-02-15 LAB — RESP PANEL BY RT-PCR (FLU A&B, COVID) ARPGX2
Influenza A by PCR: NEGATIVE
Influenza B by PCR: NEGATIVE
SARS Coronavirus 2 by RT PCR: NEGATIVE

## 2021-02-15 LAB — BRAIN NATRIURETIC PEPTIDE: B Natriuretic Peptide: 55 pg/mL (ref 0.0–100.0)

## 2021-02-15 IMAGING — DX DG CHEST 2V
3 series · 4 of 4 positions shown · non-contrast
Comparison: None.

CLINICAL DATA: Chest pain

EXAM:
CHEST - 2 VIEW

[Series 2: chest lat · 0.14mm/px · 2 of 2 slices shown]
[im 1/2]
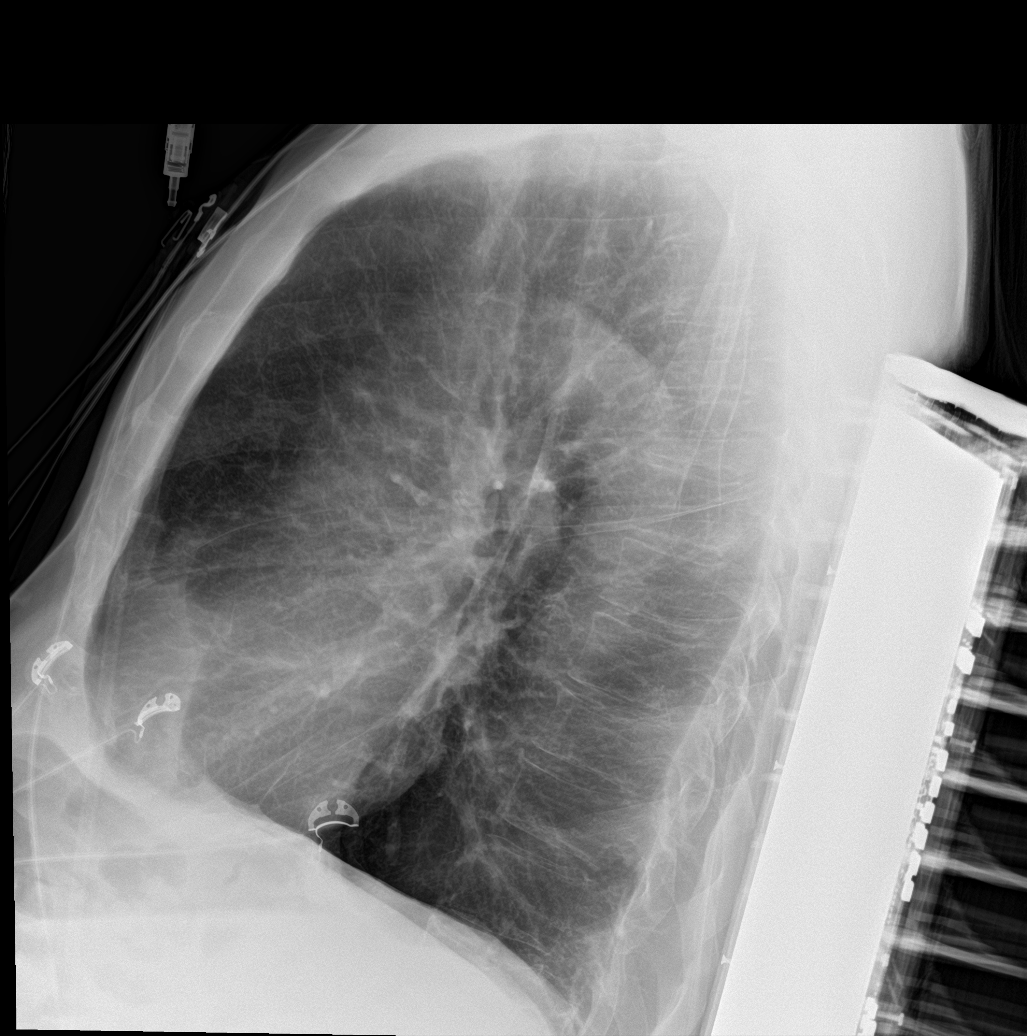
[im 2/2]
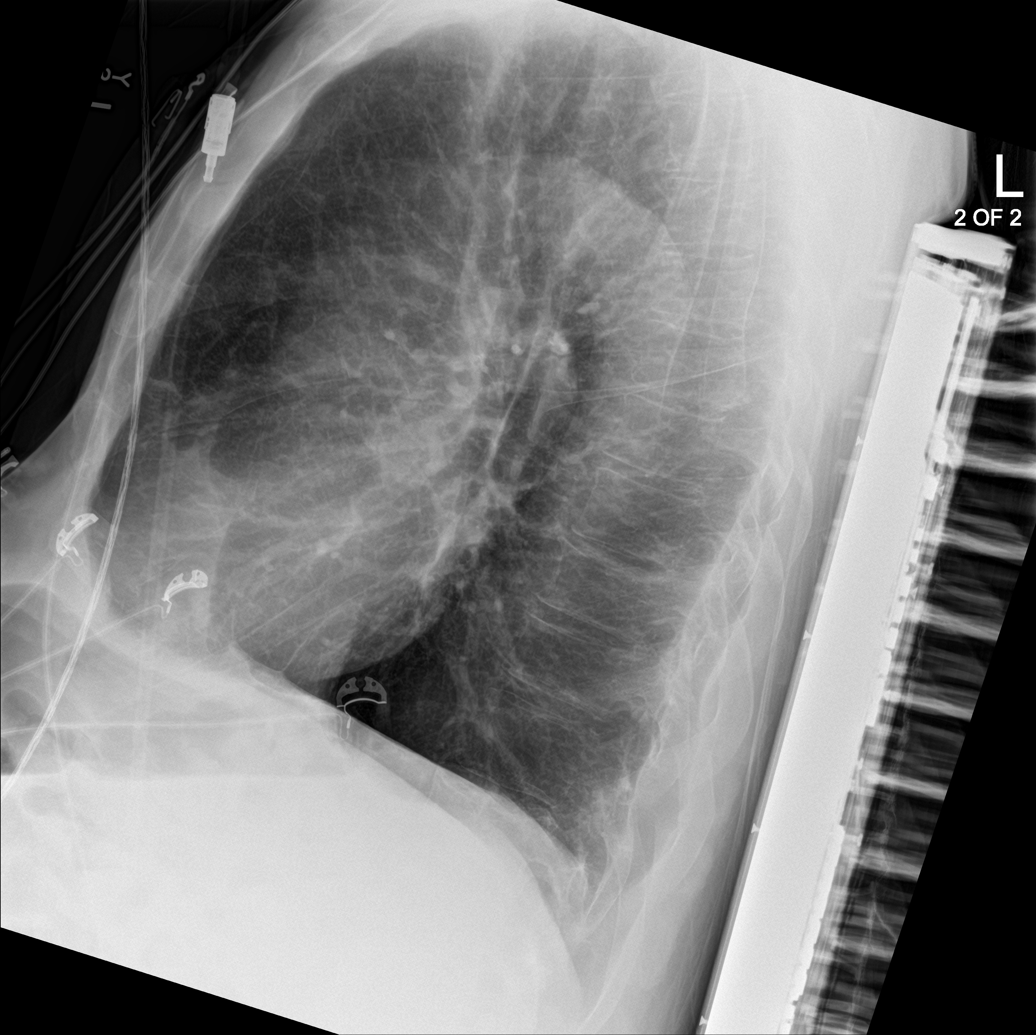

[chest ap (1 of 2)]
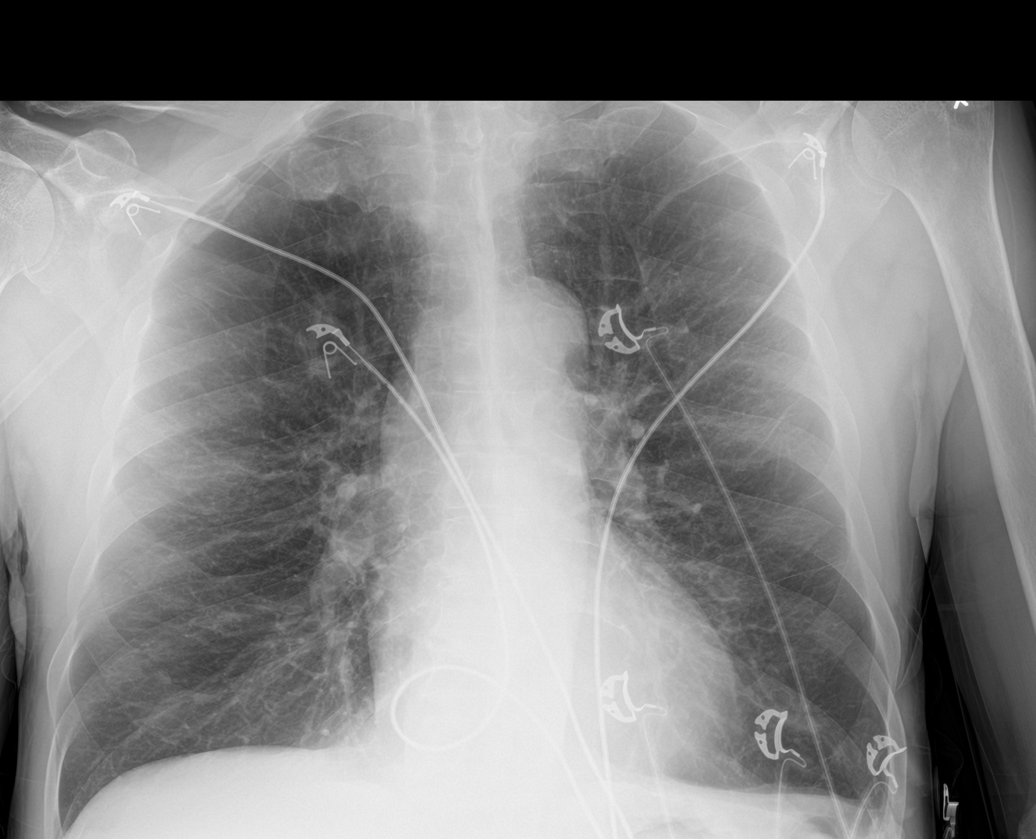

[chest ap (2 of 2)]
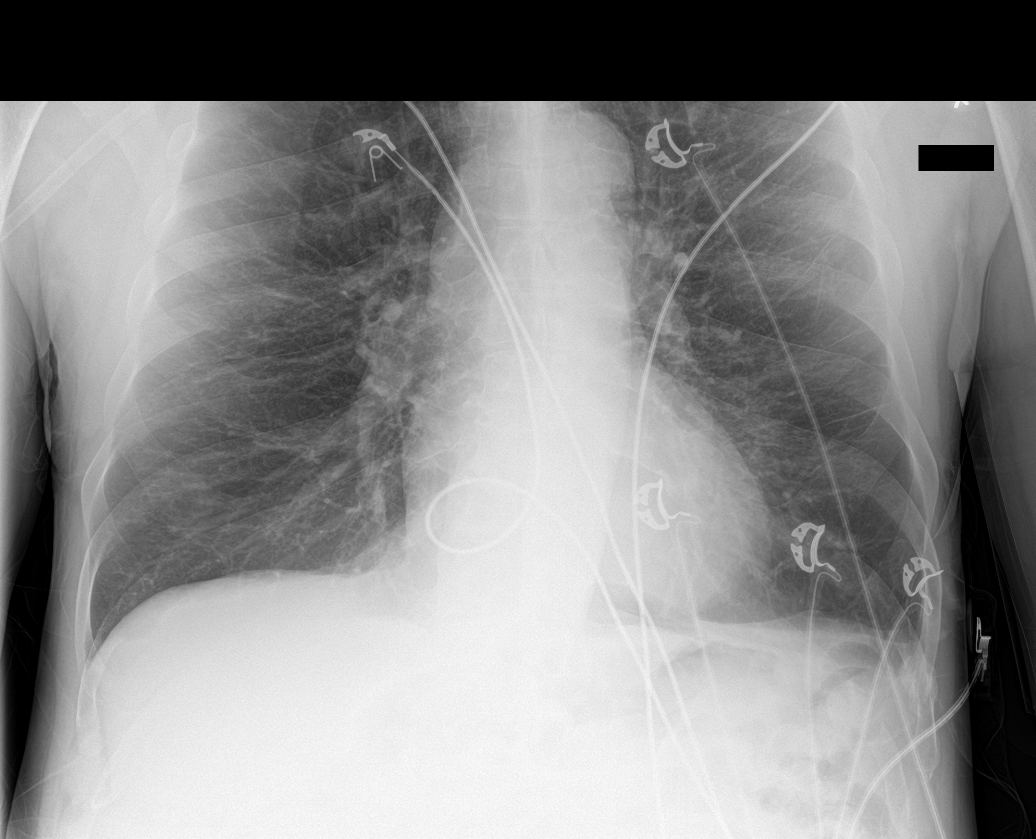

[4 of 4 positions shown; findings below may reference images not displayed]

FINDINGS: The heart size and mediastinal contours are within normal limits.
Both lungs are clear. The visualized skeletal structures are
unremarkable.
IMPRESSION: No active cardiopulmonary disease.
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
## 2021-02-15 IMAGING — DX DG CHEST 1V PORT
2 series · 2 of 2 positions shown · non-contrast
Comparison: [DATE].

CLINICAL DATA: Shortness of breath.

EXAM:
PORTABLE CHEST 1 VIEW

[chest ap (1 of 2)]
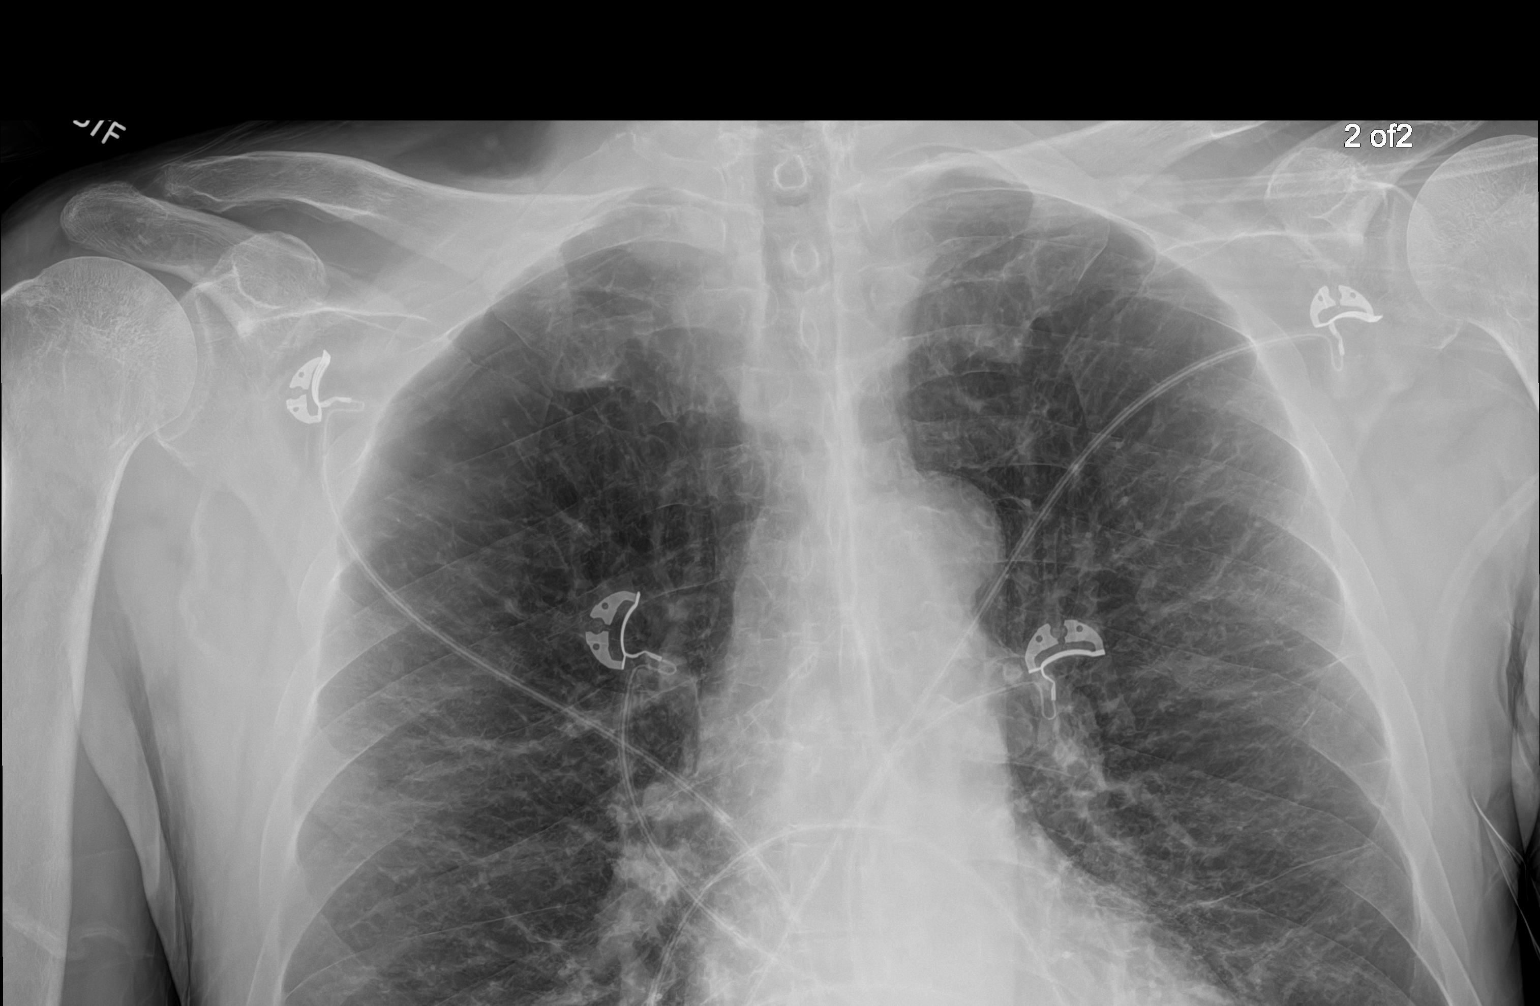

[chest ap (2 of 2)]
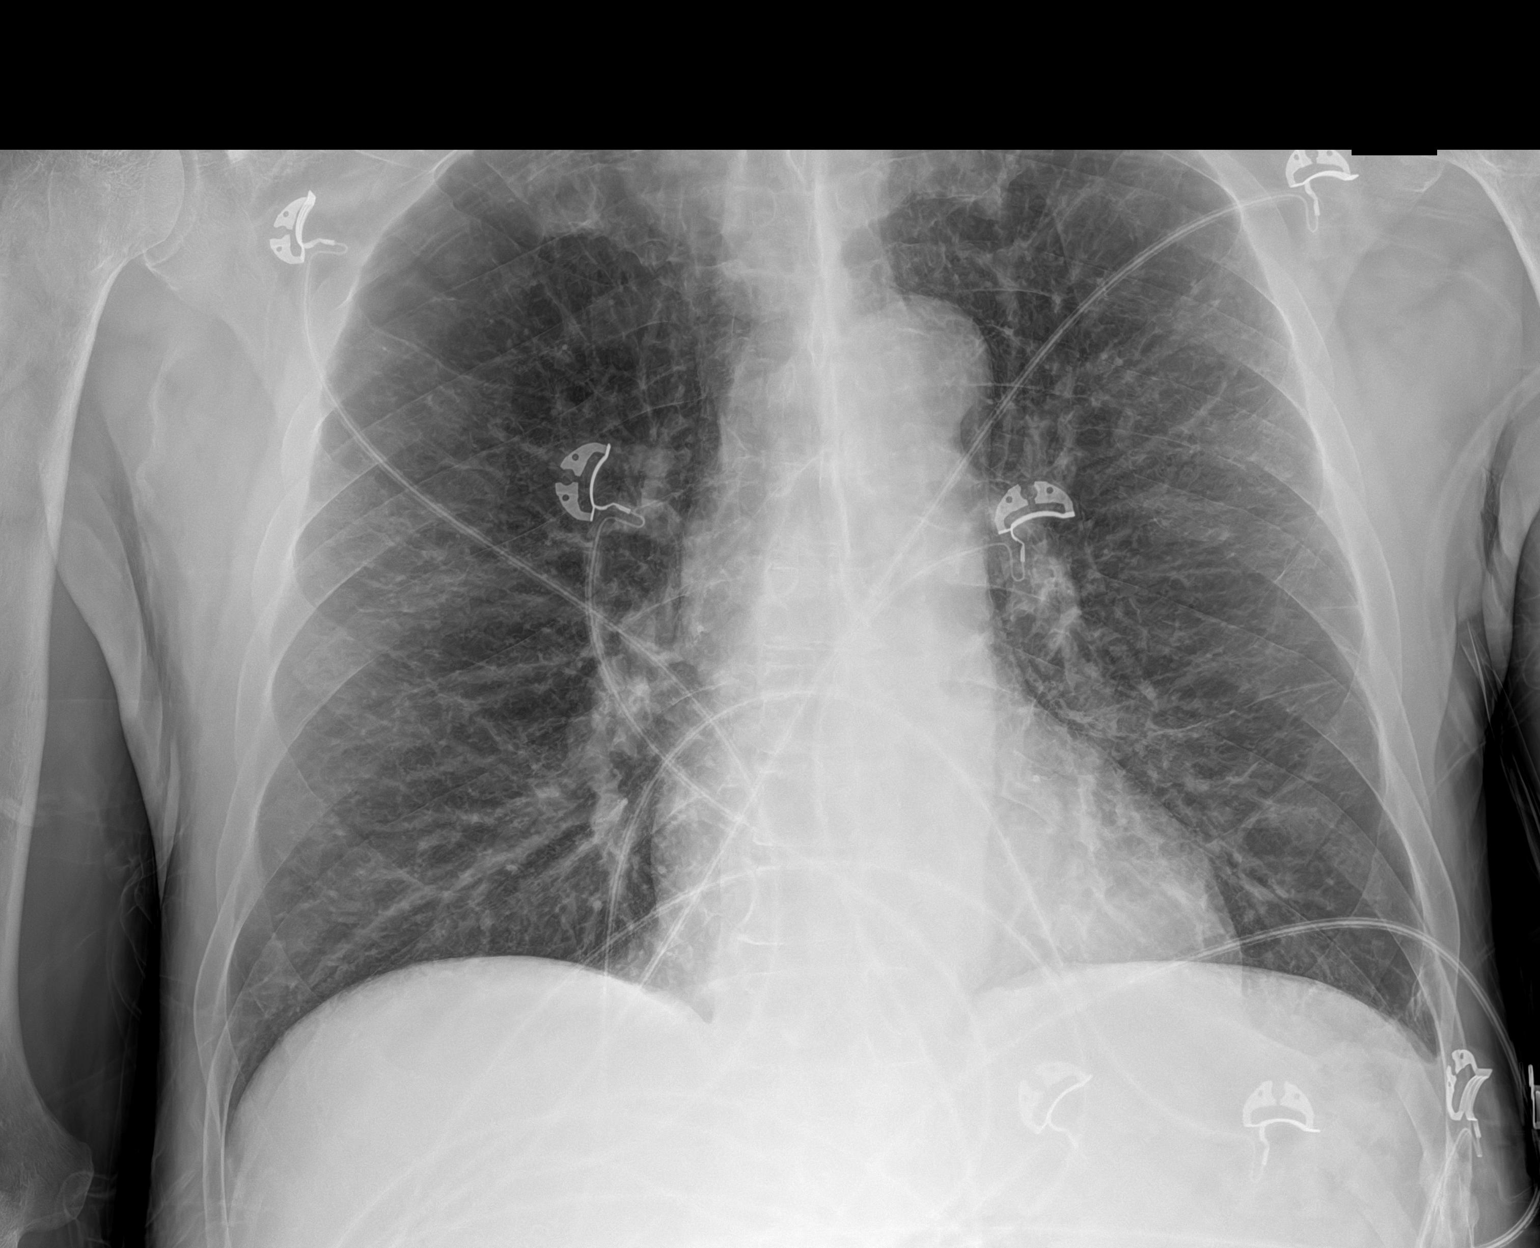

[2 of 2 positions shown; findings below may reference images not displayed]

FINDINGS: Mediastinum hilar structures normal. Bilateral mild interstitial
prominence, a component of which is most likely chronic. Active
interstitial process including pneumonitis cannot be excluded. No
pleural effusion or pneumothorax. Prior cervical spine fusion.
IMPRESSION: Bilateral interstitial prominence, a component of which is most
likely chronic. An active interstitial process including pneumonitis
cannot be excluded.

## 2021-02-15 MED ORDER — LORAZEPAM 1 MG PO TABS: 1.0000 mg | ORAL_TABLET | Freq: Three times a day (TID) | ORAL | 0 refills | Status: DC | PRN

## 2021-02-15 MED ORDER — METHYLPREDNISOLONE SODIUM SUCC 125 MG IJ SOLR
125.0000 mg | Freq: Once | INTRAMUSCULAR | Status: AC
  Administered 2021-02-15: 125 mg via INTRAVENOUS
  Filled 2021-02-15: qty 2

## 2021-02-15 MED ORDER — LORAZEPAM 2 MG/ML IJ SOLN
1.0000 mg | Freq: Once | INTRAMUSCULAR | Status: AC
  Administered 2021-02-15: 1 mg via INTRAVENOUS
  Filled 2021-02-15: qty 1

## 2021-02-15 MED ORDER — OXYCODONE-ACETAMINOPHEN 5-325 MG PO TABS
1.0000 | ORAL_TABLET | Freq: Once | ORAL | Status: AC
  Administered 2021-02-15: 1 via ORAL
  Filled 2021-02-15: qty 1

## 2021-02-15 MED ORDER — POTASSIUM CHLORIDE CRYS ER 20 MEQ PO TBCR: 20.0000 meq | EXTENDED_RELEASE_TABLET | Freq: Two times a day (BID) | ORAL | 0 refills | Status: DC

## 2021-02-15 MED ORDER — POTASSIUM CHLORIDE CRYS ER 20 MEQ PO TBCR
40.0000 meq | EXTENDED_RELEASE_TABLET | Freq: Once | ORAL | Status: AC
  Administered 2021-02-15: 40 meq via ORAL
  Filled 2021-02-15: qty 2

## 2021-02-15 MED ORDER — ALBUTEROL SULFATE HFA 108 (90 BASE) MCG/ACT IN AERS
2.0000 | INHALATION_SPRAY | Freq: Once | RESPIRATORY_TRACT | Status: AC
  Administered 2021-02-15: 2 via RESPIRATORY_TRACT
  Filled 2021-02-15: qty 6.7
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
## 2021-02-15 NOTE — ED Notes (Signed)
Called lab to inquire about labs being drawn. Phlebotomist to come when available.

## 2021-02-15 NOTE — ED Triage Notes (Signed)
Pt c/o sob, neck pain and chest pain since last night. Normally wear 3L .  steriods at home given by patients
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
## 2021-02-15 NOTE — ED Notes (Signed)
Pt agrees to sign MSE but signature pad not working at this time.
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
## 2021-03-12 ENCOUNTER — Other Ambulatory Visit: Payer: Self-pay | Admitting: Internal Medicine

## 2021-03-19 ENCOUNTER — Other Ambulatory Visit: Payer: Self-pay

## 2021-03-19 ENCOUNTER — Emergency Department (HOSPITAL_COMMUNITY)
Admission: EM | Admit: 2021-03-19 | Discharge: 2021-03-19 | Disposition: A | Discharge status: Home / Self Care | Payer: Medicaid Other | Attending: Emergency Medicine | Admitting: Emergency Medicine

## 2021-03-19 ENCOUNTER — Encounter (HOSPITAL_COMMUNITY): Payer: Self-pay

## 2021-03-19 ENCOUNTER — Emergency Department (HOSPITAL_COMMUNITY): Payer: Medicaid Other

## 2021-03-19 DIAGNOSIS — R7303 Prediabetes: Secondary | ICD-10-CM | POA: Diagnosis not present

## 2021-03-19 DIAGNOSIS — I10 Essential (primary) hypertension: Secondary | ICD-10-CM | POA: Diagnosis not present

## 2021-03-19 DIAGNOSIS — J441 Chronic obstructive pulmonary disease with (acute) exacerbation: Secondary | ICD-10-CM

## 2021-03-19 DIAGNOSIS — Z79899 Other long term (current) drug therapy: Secondary | ICD-10-CM | POA: Diagnosis not present

## 2021-03-19 DIAGNOSIS — R0602 Shortness of breath: Secondary | ICD-10-CM | POA: Diagnosis present

## 2021-03-19 DIAGNOSIS — Z20822 Contact with and (suspected) exposure to covid-19: Secondary | ICD-10-CM | POA: Insufficient documentation

## 2021-03-19 DIAGNOSIS — F17210 Nicotine dependence, cigarettes, uncomplicated: Secondary | ICD-10-CM | POA: Diagnosis not present

## 2021-03-19 DIAGNOSIS — Z85038 Personal history of other malignant neoplasm of large intestine: Secondary | ICD-10-CM | POA: Diagnosis not present

## 2021-03-19 LAB — CBC WITH DIFFERENTIAL/PLATELET
Abs Immature Granulocytes: 0.13 10*3/uL — ABNORMAL HIGH (ref 0.00–0.07)
Basophils Absolute: 0.1 10*3/uL (ref 0.0–0.1)
Basophils Relative: 1 %
Eosinophils Absolute: 0.1 10*3/uL (ref 0.0–0.5)
Eosinophils Relative: 1 %
HCT: 49.1 % (ref 39.0–52.0)
Hemoglobin: 15.7 g/dL (ref 13.0–17.0)
Immature Granulocytes: 1 %
Lymphocytes Relative: 28 %
Lymphs Abs: 4.2 10*3/uL — ABNORMAL HIGH (ref 0.7–4.0)
MCH: 31.8 pg (ref 26.0–34.0)
MCHC: 32 g/dL (ref 30.0–36.0)
MCV: 99.4 fL (ref 80.0–100.0)
Monocytes Absolute: 1.2 10*3/uL — ABNORMAL HIGH (ref 0.1–1.0)
Monocytes Relative: 8 %
Neutro Abs: 9.2 10*3/uL — ABNORMAL HIGH (ref 1.7–7.7)
Neutrophils Relative %: 61 %
Platelets: 279 10*3/uL (ref 150–400)
RBC: 4.94 MIL/uL (ref 4.22–5.81)
RDW: 14.7 % (ref 11.5–15.5)
WBC: 14.9 10*3/uL — ABNORMAL HIGH (ref 4.0–10.5)
nRBC: 0 % (ref 0.0–0.2)

## 2021-03-19 LAB — BASIC METABOLIC PANEL
Anion gap: 9 (ref 5–15)
BUN: 17 mg/dL (ref 6–20)
CO2: 29 mmol/L (ref 22–32)
Calcium: 8.8 mg/dL — ABNORMAL LOW (ref 8.9–10.3)
Chloride: 102 mmol/L (ref 98–111)
Creatinine, Ser: 0.92 mg/dL (ref 0.61–1.24)
GFR, Estimated: >60 mL/min (ref >60)
Glucose, Bld: 98 mg/dL (ref 70–99)
Potassium: 3.3 mmol/L — ABNORMAL LOW (ref 3.5–5.1)
Sodium: 140 mmol/L (ref 135–145)

## 2021-03-19 LAB — LIPASE, BLOOD: Lipase: 34 U/L (ref 11–51)

## 2021-03-19 LAB — RESP PANEL BY RT-PCR (FLU A&B, COVID) ARPGX2
Influenza A by PCR: NEGATIVE
Influenza B by PCR: NEGATIVE
SARS Coronavirus 2 by RT PCR: NEGATIVE

## 2021-03-19 LAB — BLOOD GAS, ARTERIAL
Acid-Base Excess: 4.6 mmol/L — ABNORMAL HIGH (ref 0.0–2.0)
Bicarbonate: 27.8 mmol/L (ref 20.0–28.0)
FIO2: 28
O2 Saturation: 93.3 %
Patient temperature: 37
pCO2 arterial: 48.3 mmHg — ABNORMAL HIGH (ref 32.0–48.0)
pH, Arterial: 7.398 (ref 7.350–7.450)
pO2, Arterial: 65.7 mmHg — ABNORMAL LOW (ref 83.0–108.0)

## 2021-03-19 LAB — HEPATIC FUNCTION PANEL
ALT: 32 U/L (ref 0–44)
AST: 28 U/L (ref 15–41)
Albumin: 4.1 g/dL (ref 3.5–5.0)
Alkaline Phosphatase: 78 U/L (ref 38–126)
Bilirubin, Direct: 0.1 mg/dL (ref 0.0–0.2)
Indirect Bilirubin: 0.6 mg/dL (ref 0.3–0.9)
Total Bilirubin: 0.7 mg/dL (ref 0.3–1.2)
Total Protein: 7.6 g/dL (ref 6.5–8.1)

## 2021-03-19 IMAGING — DX DG CHEST 1V PORT
1 series · 2 of 2 positions shown · non-contrast
Comparison: Chest x-ray [DATE], CT chest [DATE]

CLINICAL DATA: Shortness of breath.

EXAM:
PORTABLE CHEST 1 VIEW

[Series 1: chest ap · 0.14mm/px · 2 of 2 slices shown]
[im 1/2]
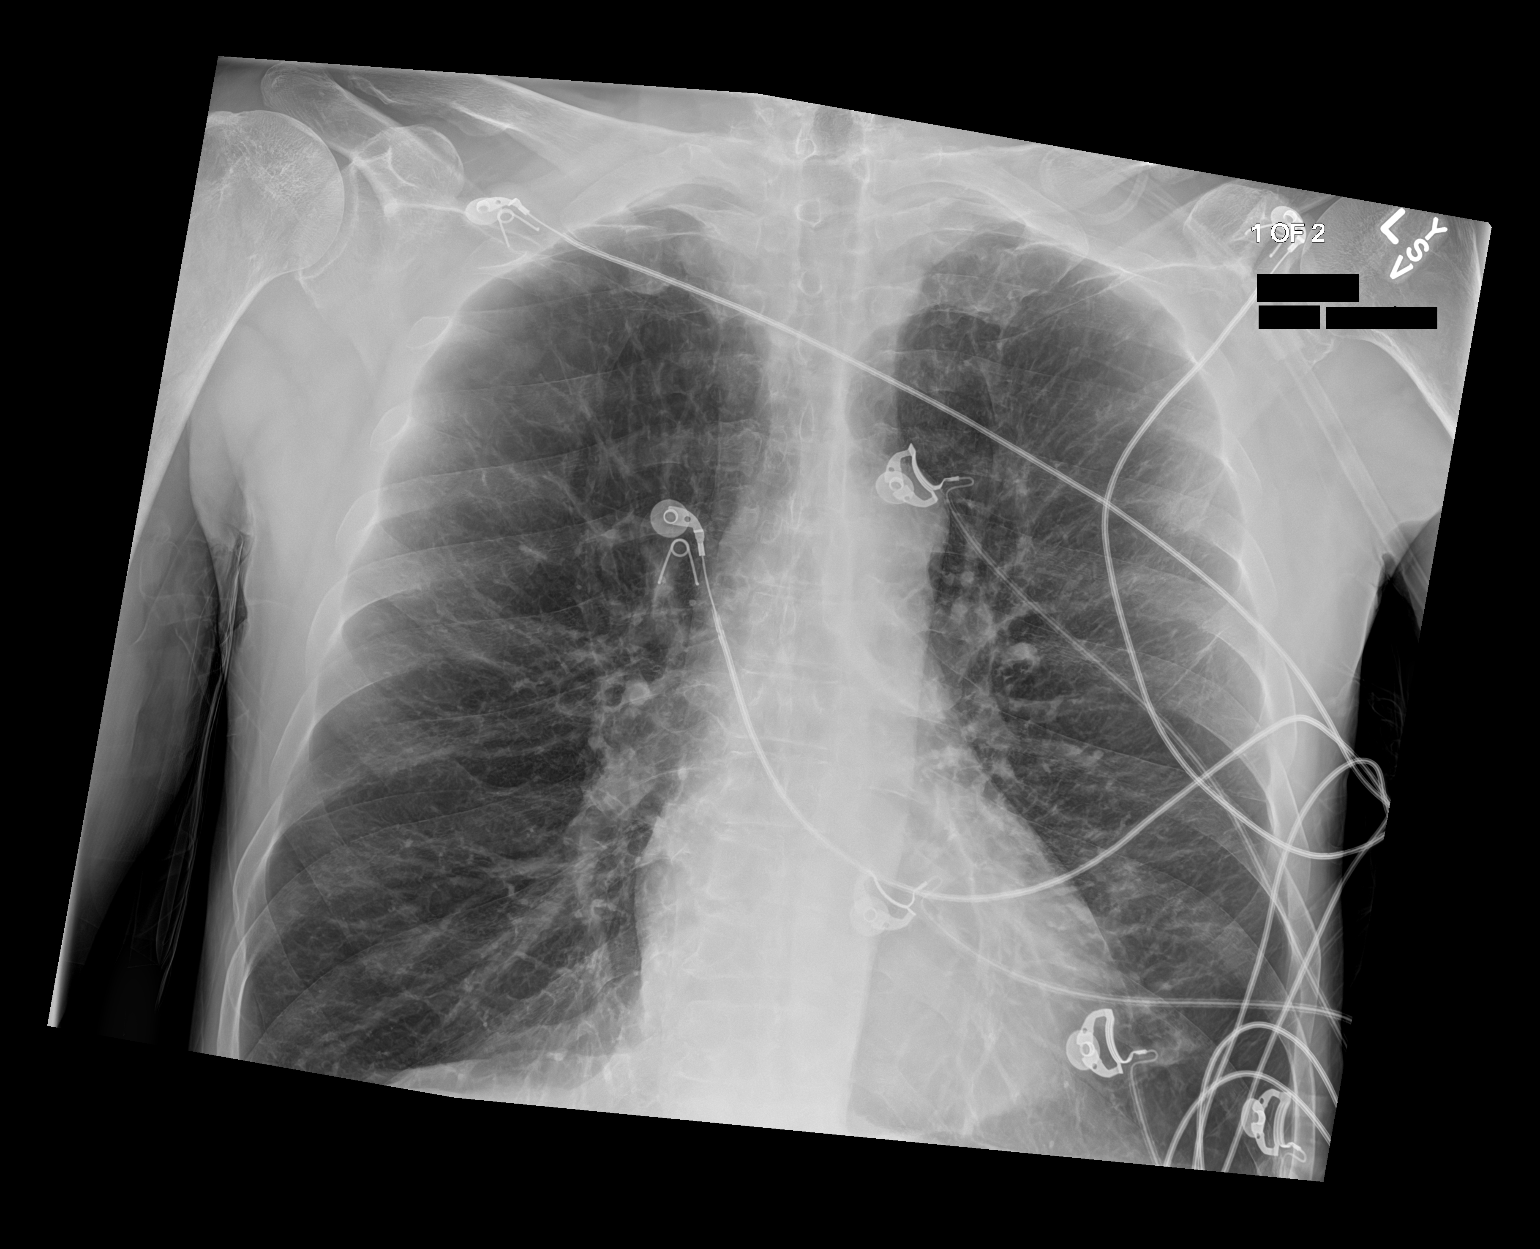
[im 2/2]
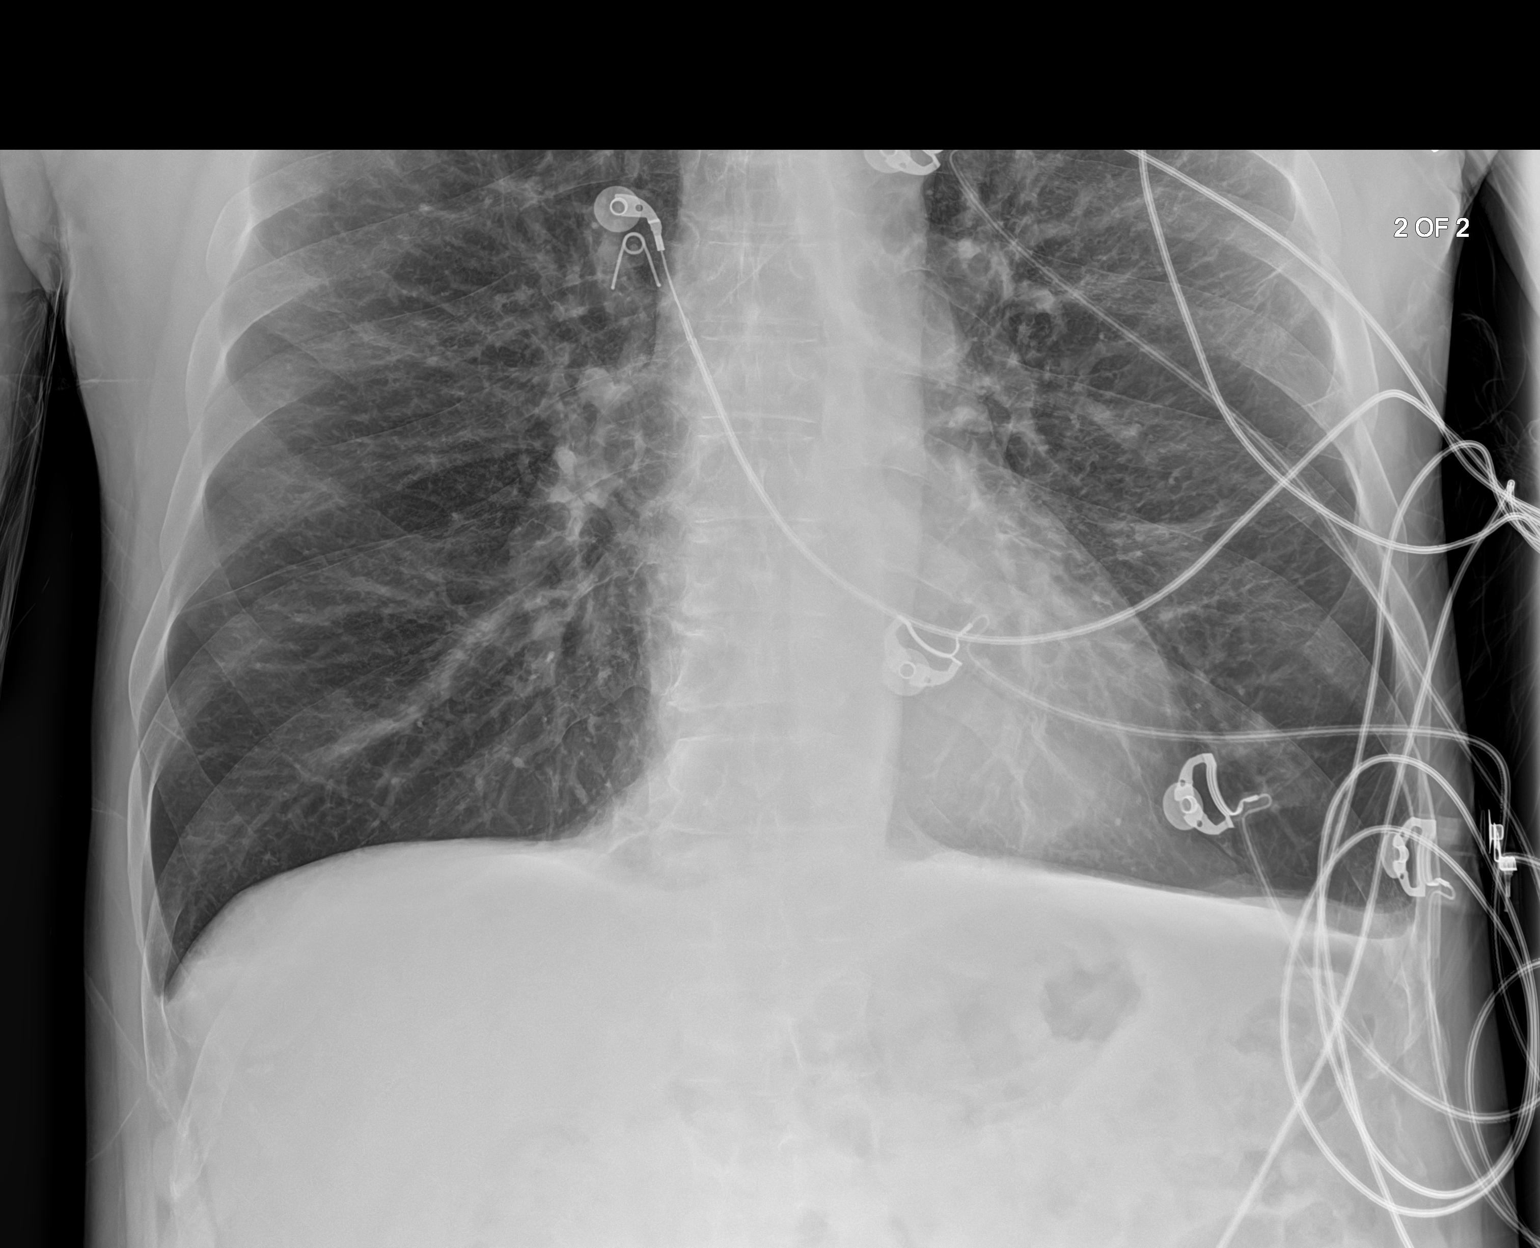

[2 of 2 positions shown; findings below may reference images not displayed]

FINDINGS: The heart size and mediastinal contours are unchanged. Aortic arch
calcification.

Emphysema with redemonstration of coarsened interstitial markings.
No focal consolidation. No pulmonary edema. Interval blunting of
bilateral costophrenic angles which could represent trace pleural
effusions. No pneumothorax.

No acute osseous abnormality.
IMPRESSION: 1. Possible trace pleural effusions.
2. Aortic Atherosclerosis ([8Z]-[8Z]) and Emphysema ([8Z]-[8Z]).
3. If clinically appropriate, please evaluate patient on age and
smoking history to determine if patient meets the inclusion criteria
for specialized low-dose lung cancer screening chest CT.

## 2021-03-19 MED ORDER — ALBUTEROL SULFATE HFA 108 (90 BASE) MCG/ACT IN AERS: 2.0000 | INHALATION_SPRAY | RESPIRATORY_TRACT | 2 refills | Status: DC | PRN

## 2021-03-19 MED ORDER — LORAZEPAM 2 MG/ML IJ SOLN
0.5000 mg | Freq: Once | INTRAMUSCULAR | Status: AC
  Administered 2021-03-19: 0.5 mg via INTRAVENOUS
  Filled 2021-03-19: qty 1

## 2021-03-19 MED ORDER — BUDESONIDE-FORMOTEROL FUMARATE 160-4.5 MCG/ACT IN AERO
INHALATION_SPRAY | RESPIRATORY_TRACT | 11 refills | Status: DC
Start: 2021-03-19 — End: 2021-07-25

## 2021-03-19 MED ORDER — ALBUTEROL (5 MG/ML) CONTINUOUS INHALATION SOLN
10.0000 mg/h | INHALATION_SOLUTION | Freq: Once | RESPIRATORY_TRACT | Status: AC
  Administered 2021-03-19: 10 mg/h via RESPIRATORY_TRACT
  Filled 2021-03-19: qty 20

## 2021-03-19 MED ORDER — IPRATROPIUM BROMIDE 0.02 % IN SOLN
0.5000 mg | Freq: Once | RESPIRATORY_TRACT | Status: AC
  Administered 2021-03-19: 0.5 mg via RESPIRATORY_TRACT
  Filled 2021-03-19: qty 2.5

## 2021-03-19 MED ORDER — SPIRIVA RESPIMAT 2.5 MCG/ACT IN AERS: 2.0000 | INHALATION_SPRAY | Freq: Every day | RESPIRATORY_TRACT | 11 refills | Status: DC

## 2021-03-19 MED ORDER — ALBUTEROL SULFATE (2.5 MG/3ML) 0.083% IN NEBU
5.0000 mg | INHALATION_SOLUTION | Freq: Once | RESPIRATORY_TRACT | Status: AC
  Administered 2021-03-19: 5 mg via RESPIRATORY_TRACT

## 2021-03-19 MED ORDER — PREDNISONE 50 MG PO TABS
60.0000 mg | ORAL_TABLET | Freq: Once | ORAL | Status: AC
  Administered 2021-03-19: 60 mg via ORAL
  Filled 2021-03-19: qty 1

## 2021-03-19 NOTE — ED Provider Notes (Signed)
[facility] Provider Note   CSN: [id number] Arrival date & time: 03/19/21  0143     History Chief Complaint  Patient presents with  . Shortness of Breath  Level 5 caveat due to acuity of condition  Tony Sullivan is a 60 y.o. male.  The history is provided by the patient. The history is limited by the condition of the patient.  Shortness of Breath Severity:  Severe Onset quality:  Sudden Timing:  Constant Progression:  Worsening Chronicity:  Recurrent Relieved by:  Nothing Worsened by:  Nothing Associated symptoms: no fever and no hemoptysis   Associated symptoms comment:  Abdominal tightness, chest tightness  Patient with history of COPD, chronic respiratory failure on home oxygen (4 L) presents with shortness of breath.  Patient reports approximately 1 hour ago he began having increasing shortness of breath and chest tightness.  He also reports upper abdominal tightness.    Past Medical History:  Diagnosis Date  . Anxiety   . Arthritis   . COPD (chronic obstructive pulmonary disease) ([place]) 2004   diagnosed in 2004, no PFT's to date.  Started on home O2 12/2013, after found to be desatting at PCP's office, and referred to pulmonology.  . Depression   . GERD (gastroesophageal reflux disease)   . High blood pressure   . Prediabetes   . Seasonal allergies   . Tobacco dependence     Patient Active Problem List   Diagnosis Date Noted  . Hyperlipidemia 05/24/2020  . Hypokalemia 05/24/2020  . Chest pain 05/23/2020  . Bipolar disorder ([place]) 05/23/2020  . HNP (herniated nucleus pulposus), cervical 09/06/2019  . Abdominal pain   . Poor dentition 11/04/2017  . Acute on chronic respiratory failure with hypoxia ([place]) 08/29/2017  . ETOH abuse 03/30/2017  . Cigarette smoker 03/30/2017  . Acute respiratory failure with hypoxia and hypercapnia ([place]) 03/29/2017  . Special screening for malignant neoplasms, colon   . Benign neoplasm of ascending colon   .  Benign neoplasm of descending colon   . Hoarseness 08/22/2016  . GERD (gastroesophageal reflux disease) 08/22/2016  . Atypical chest pain 12/05/2015  . Essential hypertension 06/15/2014  . Pectoralis muscle strain 01/11/2014  . COPD with acute exacerbation ([place]) 01/10/2014  . COPD  GOLD III 01/08/2014  . Smoker 01/08/2014  . Cough 01/08/2014  . Chronic respiratory failure with hypoxia ([place]) 01/08/2014    Past Surgical History:  Procedure Laterality Date  . ANTERIOR CERVICAL DECOMP/DISCECTOMY FUSION N/A 09/06/2019   Procedure: Anterior Cervical Discecectomy Fusion - Cerivcal Five-Cervical Six;  Surgeon: Kary Kos, MD;  Location: [facility];  Service: Neurosurgery;  Laterality: N/A;  Anterior Cervical Discecectomy Fusion - Cerivcal Five-Cervical Six  . ANTERIOR CERVICAL DISCECTOMY  09/06/2019  . APPENDECTOMY  1980  . COLONOSCOPY WITH PROPOFOL N/A 12/10/2016   Procedure: COLONOSCOPY WITH PROPOFOL;  Surgeon: [name], MD;  Location: [facility];  Service: Endoscopy;  Laterality: N/A;  . HIP SURGERY Right   . SHOULDER ARTHROSCOPY Right        Family History  Problem Relation Age of Onset  . Emphysema Mother   . Allergies Mother        "everyone in family"  . Asthma Mother        "everyone in family"  . Heart disease Mother   . Clotting disorder Mother   . Cancer Mother   . Emphysema Father   . Allergies Father   . Allergies Son   . Seizures Brother   .  Diabetes Brother     Social History   Tobacco Use  . Smoking status: Current Some Day Smoker    Packs/day: 0.00    Years: 36.00    Pack years: 0.00    Types: Cigarettes  . Smokeless tobacco: Never Used  . Tobacco comment: smokes 2 cigarettes per day 11/06/2020  Vaping Use  . Vaping Use: Never used  Substance Use Topics  . Alcohol use: Not Currently    Alcohol/week: 14.0 - 21.0 standard drinks    Types: 14 - 21 Standard drinks or equivalent per week    Comment: 2-3 beers/day, with occasional "binges".  No history of  withdrawal  . Drug use: Not Currently    Types: Marijuana, Cocaine    Comment: last cocaine use last month    Home Medications Prior to Admission medications   Medication Sig Start Date End Date Taking? Authorizing Provider  albuterol (PROAIR HFA) 108 (90 Base) MCG/ACT inhaler Inhale 2 puffs into the lungs every 4 (four) hours as needed. Patient taking differently: Inhale 2 puffs into the lungs every 4 (four) hours as needed for wheezing or shortness of breath. 11/24/20   [name], MD  albuterol (PROVENTIL) (2.5 MG/3ML) 0.083% nebulizer solution Take 3 mLs (2.5 mg total) by nebulization every 4 (four) hours as needed for wheezing or shortness of breath. 04/28/18   [name], NP  atorvastatin (LIPITOR) 40 MG tablet TAKE ONE TABLET BY MOUTH DAILY Patient taking differently: Take 40 mg by mouth at bedtime. 07/06/20   [name], MD  benztropine (COGENTIN) 0.5 MG tablet Take 0.5 mg by mouth at bedtime.     [provider]  budesonide-formoterol (SYMBICORT) 160-4.5 MCG/ACT inhaler INHALE TWO PUFFS BY MOUTH INTO THE LUNGS TWICE A DAY Patient taking differently: Inhale 2 puffs into the lungs in the morning and at bedtime. 11/06/20   [name], MD  cetirizine (ZYRTEC) 10 MG tablet Take 10 mg by mouth daily.    [provider]  diclofenac Sodium (VOLTAREN) 1 % GEL Apply 2 g topically 4 (four) times daily.    [provider]  dorzolamide (TRUSOPT) 2 % ophthalmic solution Place 1 drop into the left eye 2 (two) times daily. 12/12/20   [provider]  famotidine (PEPCID) 20 MG tablet TAKE 1 TABLET (20 MG TOTAL) BY MOUTH AT BEDTIME. 02/05/21   [name], MD  fluticasone (FLONASE) 50 MCG/ACT nasal spray Place 2 sprays into both nostrils 3 (three) times daily as needed for allergies.     [provider]  hydrochlorothiazide (MICROZIDE) 12.5 MG capsule Take 1 capsule (12.5 mg total) by mouth every morning. 01/29/21   Tolia, Sunit, DO   latanoprost (XALATAN) 0.005 % ophthalmic solution Place 1 drop into both eyes at bedtime. 05/24/19   [provider]  LORazepam (ATIVAN) 1 MG tablet Take 1 tablet (1 mg total) by mouth 3 (three) times daily as needed for anxiety. 02/15/21   Daleen Bo, MD  losartan (COZAAR) 100 MG tablet Take 100 mg by mouth daily.    [provider]  loxapine (LOXITANE) 10 MG capsule Take 10 mg by mouth at bedtime.     [provider]  megestrol (MEGACE) 40 MG tablet Take 40 mg by mouth daily.    [provider]  mirtazapine (REMERON) 30 MG tablet Take 30 mg by mouth at bedtime.    [provider]  Multiple Vitamin (MULTIVITAMIN) capsule Take 1 capsule by mouth daily.  [provider]  OXYGEN Inhale 2 L into the lungs as directed. 2-3 liters with sleep and exertion as needed for low oxygen    [provider]  pantoprazole (PROTONIX) 40 MG tablet Take 40 mg by mouth daily.    [provider]  polyethylene glycol (MIRALAX / GLYCOLAX) 17 g packet TAKE 17 GRAMS BY MOUTH DAILY 03/09/20   [name], MD  potassium chloride SA (KLOR-CON) 20 MEQ tablet Take 1 tablet (20 mEq total) by mouth 2 (two) times daily. 02/15/21   Daleen Bo, MD  predniSONE (DELTASONE) 10 MG tablet 1-2 tablets as directed 03/12/21   [name], MD  risperiDONE (RISPERDAL) 2 MG tablet Take 2 mg by mouth at bedtime.    [provider]  Tiotropium Bromide Monohydrate (SPIRIVA RESPIMAT) 2.5 MCG/ACT AERS Inhale 2 puffs into the lungs daily. 11/06/20   [name], MD    Allergies    Penicillins and Levocetirizine  Review of Systems   Review of Systems  Unable to perform ROS: Acuity of condition  Constitutional: Negative for fever.  Respiratory: Positive for shortness of breath. Negative for hemoptysis.     Physical Exam Updated Vital Signs BP (!) 166/115 (BP Location: Left Arm)   Pulse 96   Temp 97.9 F (36.6 C) (Oral)   Resp (!) 34   Ht  1.803 m (5\' 11" )   Wt 79 kg   SpO2 99%   BMI 24.29 kg/m   Physical Exam CONSTITUTIONAL: Chronically ill-appearing, distress noted HEAD: Normocephalic/atraumatic EYES: EOMI/PERRL ENMT: Mucous membranes moist NECK: supple no meningeal signs SPINE/BACK:entire spine nontender CV: S1/S2 noted, no murmurs/rubs/gallops noted LUNGS: Decreased breath sounds bilaterally, tachypnea noted, wheezing bilateral ABDOMEN: soft, mild epigastric tenderness, no rebound or guarding, bowel sounds noted throughout abdomen GU:no cva tenderness NEURO: Pt is awake/alert/appropriate, moves all extremitiesx4.  No facial droop.   EXTREMITIES: pulses normal/equal, full ROM, no lower extremity edema SKIN: warm, color normal PSYCH: Anxious  ED Results / Procedures / Treatments   Labs (all labs ordered are listed, but only abnormal results are displayed) Labs Reviewed  BASIC METABOLIC PANEL - Abnormal; Notable for the following components:      Result Value   Potassium 3.3 (*)    Calcium 8.8 (*)    All other components within normal limits  CBC WITH DIFFERENTIAL/PLATELET - Abnormal; Notable for the following components:   WBC 14.9 (*)    Neutro Abs 9.2 (*)    Lymphs Abs 4.2 (*)    Monocytes Absolute 1.2 (*)    Abs Immature Granulocytes 0.13 (*)    All other components within normal limits  BLOOD GAS, ARTERIAL - Abnormal; Notable for the following components:   pCO2 arterial 48.3 (*)    pO2, Arterial 65.7 (*)    Acid-Base Excess 4.6 (*)    All other components within normal limits  RESP PANEL BY RT-PCR (FLU A&B, COVID) ARPGX2  LIPASE, BLOOD  HEPATIC FUNCTION PANEL    EKG EKG Interpretation  Date/Time:  Monday Mar 19 2021 01:52:50 EDT Ventricular Rate:  96 PR Interval:  165 QRS Duration: 79 QT Interval:  347 QTC Calculation: 439 R Axis:   -87 Text Interpretation: Sinus rhythm Biatrial enlargement Left anterior fascicular block Abnormal R-wave progression, late transition Artifact in lead(s) V4  V5 V6 No significant change since last tracing Confirmed by Ripley Fraise [phone]) on 03/19/2021 1:56:25 AM   Radiology DG Chest Port 1 View  Result Date: 03/19/2021 CLINICAL DATA:  Shortness of  breath. EXAM: PORTABLE CHEST 1 VIEW COMPARISON:  Chest x-ray 02/15/2021, CT chest 05/12/2018 FINDINGS: The heart size and mediastinal contours are unchanged. Aortic arch calcification. Emphysema with redemonstration of coarsened interstitial markings. No focal consolidation. No pulmonary edema. Interval blunting of bilateral costophrenic angles which could represent trace pleural effusions. No pneumothorax. No acute osseous abnormality. IMPRESSION: 1. Possible trace pleural effusions. 2. Aortic Atherosclerosis (ICD10-I70.0) and Emphysema (ICD10-J43.9). 3. If clinically appropriate, please evaluate patient on age and smoking history to determine if patient meets the inclusion criteria for specialized low-dose lung cancer screening chest CT. Electronically Signed   By: Iven Finn M.D.   On: 03/19/2021 02:46    Procedures .Critical Care Performed by: Ripley Fraise, MD Authorized by: Ripley Fraise, MD   Critical care provider statement:    Critical care time (minutes):  12   Critical care start time:  03/19/2021 2:28 AM   Critical care end time:  03/19/2021 3:30 AM   Critical care time was exclusive of:  Separately billable procedures and treating other patients   Critical care was necessary to treat or prevent imminent or life-threatening deterioration of the following conditions:  Respiratory failure   Critical care was time spent personally by me on the following activities:  Evaluation of patient's response to treatment, examination of patient, development of treatment plan with patient or surrogate, review of old charts, re-evaluation of patient's condition, pulse oximetry, ordering and review of radiographic studies, ordering and review of laboratory studies and ordering and performing  treatments and interventions   I assumed direction of critical care for this patient from another provider in my specialty: no       Medications Ordered in ED Medications  albuterol (PROVENTIL,VENTOLIN) solution continuous neb (10 mg/hr Nebulization Given 03/19/21 0228)  ipratropium (ATROVENT) nebulizer solution 0.5 mg (0.5 mg Nebulization Given 03/19/21 0228)  predniSONE (DELTASONE) tablet 60 mg (60 mg Oral Given 03/19/21 0220)  LORazepam (ATIVAN) injection 0.5 mg (0.5 mg Intravenous Given 03/19/21 0221)  albuterol (PROVENTIL) (2.5 MG/3ML) 0.083% nebulizer solution 5 mg (5 mg Nebulization Given 03/19/21 0354)    ED Course  I have reviewed the triage vital signs and the nursing notes.  Pertinent labs & imaging results that were available during my care of the patient were reviewed by me and considered in my medical decision making (see chart for details).    MDM Rules/Calculators/A&P                          2:28 AM Patient history of COPD presents with exacerbation Patient has very tight lung sounds and wheezing, will need continuous nebulized therapy.  We will follow closely 3:16 AM Overall pt is improving He is receiving nebs, but still with wheeze Will continue to monitor 4:10 AM Patient's work of breathing is improved, but he is still wheezing.  He is resting comfortably but easily arousable.  He was given Ativan due to anxiety.  We will continue to monitor 6:20 AM Patient still sleeping but easily arousable.  He reports he is on 3-4 L of nasal cannula at home, in the ER he is over 95% on his baseline nasal cannula. His work of breathing is improved. His wheezing is improving. ABG was performed and no significant hypercapnia. Overall patient is appropriate for discharge home.  Per his medication list he is already on steroids.  He can continue all of his home medications. Final Clinical Impression(s) / ED Diagnoses Final diagnoses:  COPD exacerbation (  [facility])    Rx / DC  Orders ED Discharge Orders    None       Ripley Fraise, MD 03/19/21 [phone]

## 2021-03-19 NOTE — ED Triage Notes (Signed)
Pt arrived via [facility] c/o SOB. EMS adm albuterol neb x1. O2 on via  4lpm same at home.

## 2021-03-19 NOTE — ED Notes (Signed)
ED Provider at bedside. 

## 2021-03-23 ENCOUNTER — Telehealth: Payer: Self-pay | Admitting: Internal Medicine

## 2021-03-23 NOTE — Telephone Encounter (Signed)
Spoke to patient, who is requesting two albuterol inhalers per month.  He is aware that insurance will not pay for two inhalers monthly.   Patient stated that he is having to use albuterol more then prescribed, therefore he has ran out and pharmacy will not refill medication as it is too early.  Pt scheduled to see Dr. [name] on 04/18/2021. Offered sooner appt. Patient declined. Nothing further needed at this time.   Will route to MW as an Pharmacist, hospital

## 2021-03-23 NOTE — Telephone Encounter (Signed)
Called and spoke with Patient.  Dr. Gustavus Bryant recommendations given. Understanding stated.  Nothing further at this time.

## 2021-03-23 NOTE — Telephone Encounter (Signed)
Ok but please Remind him:  Try albuterol 15 min before an activity (on alternating days)  that you know would make you short of breath and see if it makes any difference and if makes none then don't take albuterol after activity unless you can't catch your breath as this means it's the resting that helps, not the albuterol.

## 2021-03-31 ENCOUNTER — Emergency Department (HOSPITAL_COMMUNITY): Payer: Medicaid Other

## 2021-03-31 ENCOUNTER — Encounter (HOSPITAL_COMMUNITY): Payer: Self-pay

## 2021-03-31 ENCOUNTER — Other Ambulatory Visit: Payer: Self-pay

## 2021-03-31 ENCOUNTER — Emergency Department (HOSPITAL_COMMUNITY)
Admission: EM | Admit: 2021-03-31 | Discharge: 2021-03-31 | Disposition: A | Discharge status: Home / Self Care | Payer: Medicaid Other | Attending: Emergency Medicine | Admitting: Emergency Medicine

## 2021-03-31 DIAGNOSIS — Z7951 Long term (current) use of inhaled steroids: Secondary | ICD-10-CM | POA: Insufficient documentation

## 2021-03-31 DIAGNOSIS — R1084 Generalized abdominal pain: Secondary | ICD-10-CM | POA: Insufficient documentation

## 2021-03-31 DIAGNOSIS — R0602 Shortness of breath: Secondary | ICD-10-CM | POA: Diagnosis present

## 2021-03-31 DIAGNOSIS — Z79899 Other long term (current) drug therapy: Secondary | ICD-10-CM | POA: Diagnosis not present

## 2021-03-31 DIAGNOSIS — I10 Essential (primary) hypertension: Secondary | ICD-10-CM | POA: Insufficient documentation

## 2021-03-31 DIAGNOSIS — F17210 Nicotine dependence, cigarettes, uncomplicated: Secondary | ICD-10-CM | POA: Diagnosis not present

## 2021-03-31 DIAGNOSIS — M25512 Pain in left shoulder: Secondary | ICD-10-CM | POA: Diagnosis not present

## 2021-03-31 DIAGNOSIS — J441 Chronic obstructive pulmonary disease with (acute) exacerbation: Secondary | ICD-10-CM | POA: Diagnosis not present

## 2021-03-31 DIAGNOSIS — M25511 Pain in right shoulder: Secondary | ICD-10-CM | POA: Insufficient documentation

## 2021-03-31 LAB — BRAIN NATRIURETIC PEPTIDE: B Natriuretic Peptide: 38 pg/mL (ref 0.0–100.0)

## 2021-03-31 LAB — CBC WITH DIFFERENTIAL/PLATELET
Abs Immature Granulocytes: 0.13 10*3/uL — ABNORMAL HIGH (ref 0.00–0.07)
Basophils Absolute: 0.1 10*3/uL (ref 0.0–0.1)
Basophils Relative: 0 %
Eosinophils Absolute: 0.1 10*3/uL (ref 0.0–0.5)
Eosinophils Relative: 0 %
HCT: 46.8 % (ref 39.0–52.0)
Hemoglobin: 14.8 g/dL (ref 13.0–17.0)
Immature Granulocytes: 1 %
Lymphocytes Relative: 14 %
Lymphs Abs: 2.9 10*3/uL (ref 0.7–4.0)
MCH: 31.8 pg (ref 26.0–34.0)
MCHC: 31.6 g/dL (ref 30.0–36.0)
MCV: 100.6 fL — ABNORMAL HIGH (ref 80.0–100.0)
Monocytes Absolute: 1.3 10*3/uL — ABNORMAL HIGH (ref 0.1–1.0)
Monocytes Relative: 6 %
Neutro Abs: 16.6 10*3/uL — ABNORMAL HIGH (ref 1.7–7.7)
Neutrophils Relative %: 79 %
Platelets: 264 10*3/uL (ref 150–400)
RBC: 4.65 MIL/uL (ref 4.22–5.81)
RDW: 14.8 % (ref 11.5–15.5)
WBC: 21.1 10*3/uL — ABNORMAL HIGH (ref 4.0–10.5)
nRBC: 0 % (ref 0.0–0.2)

## 2021-03-31 LAB — COMPREHENSIVE METABOLIC PANEL
ALT: 24 U/L (ref 0–44)
AST: 23 U/L (ref 15–41)
Albumin: 3.5 g/dL (ref 3.5–5.0)
Alkaline Phosphatase: 65 U/L (ref 38–126)
Anion gap: 9 (ref 5–15)
BUN: 20 mg/dL (ref 6–20)
CO2: 31 mmol/L (ref 22–32)
Calcium: 8.7 mg/dL — ABNORMAL LOW (ref 8.9–10.3)
Chloride: 101 mmol/L (ref 98–111)
Creatinine, Ser: 1.03 mg/dL (ref 0.61–1.24)
GFR, Estimated: >60 mL/min (ref >60)
Glucose, Bld: 91 mg/dL (ref 70–99)
Potassium: 2.8 mmol/L — ABNORMAL LOW (ref 3.5–5.1)
Sodium: 141 mmol/L (ref 135–145)
Total Bilirubin: 0.8 mg/dL (ref 0.3–1.2)
Total Protein: 6.7 g/dL (ref 6.5–8.1)

## 2021-03-31 LAB — TROPONIN I (HIGH SENSITIVITY)
Troponin I (High Sensitivity): 14 ng/L (ref <18)
Troponin I (High Sensitivity): 15 ng/L (ref <18)

## 2021-03-31 LAB — LIPASE, BLOOD: Lipase: 32 U/L (ref 11–51)

## 2021-03-31 IMAGING — DX DG CHEST 1V PORT
1 series · 1 of 1 positions shown · non-contrast
Comparison: Portable chest [DATE] and earlier.

CLINICAL DATA: 59-year-old male with chest pain, shortness of
breath, shoulder pain.

EXAM:
PORTABLE CHEST 1 VIEW

[chest ap]
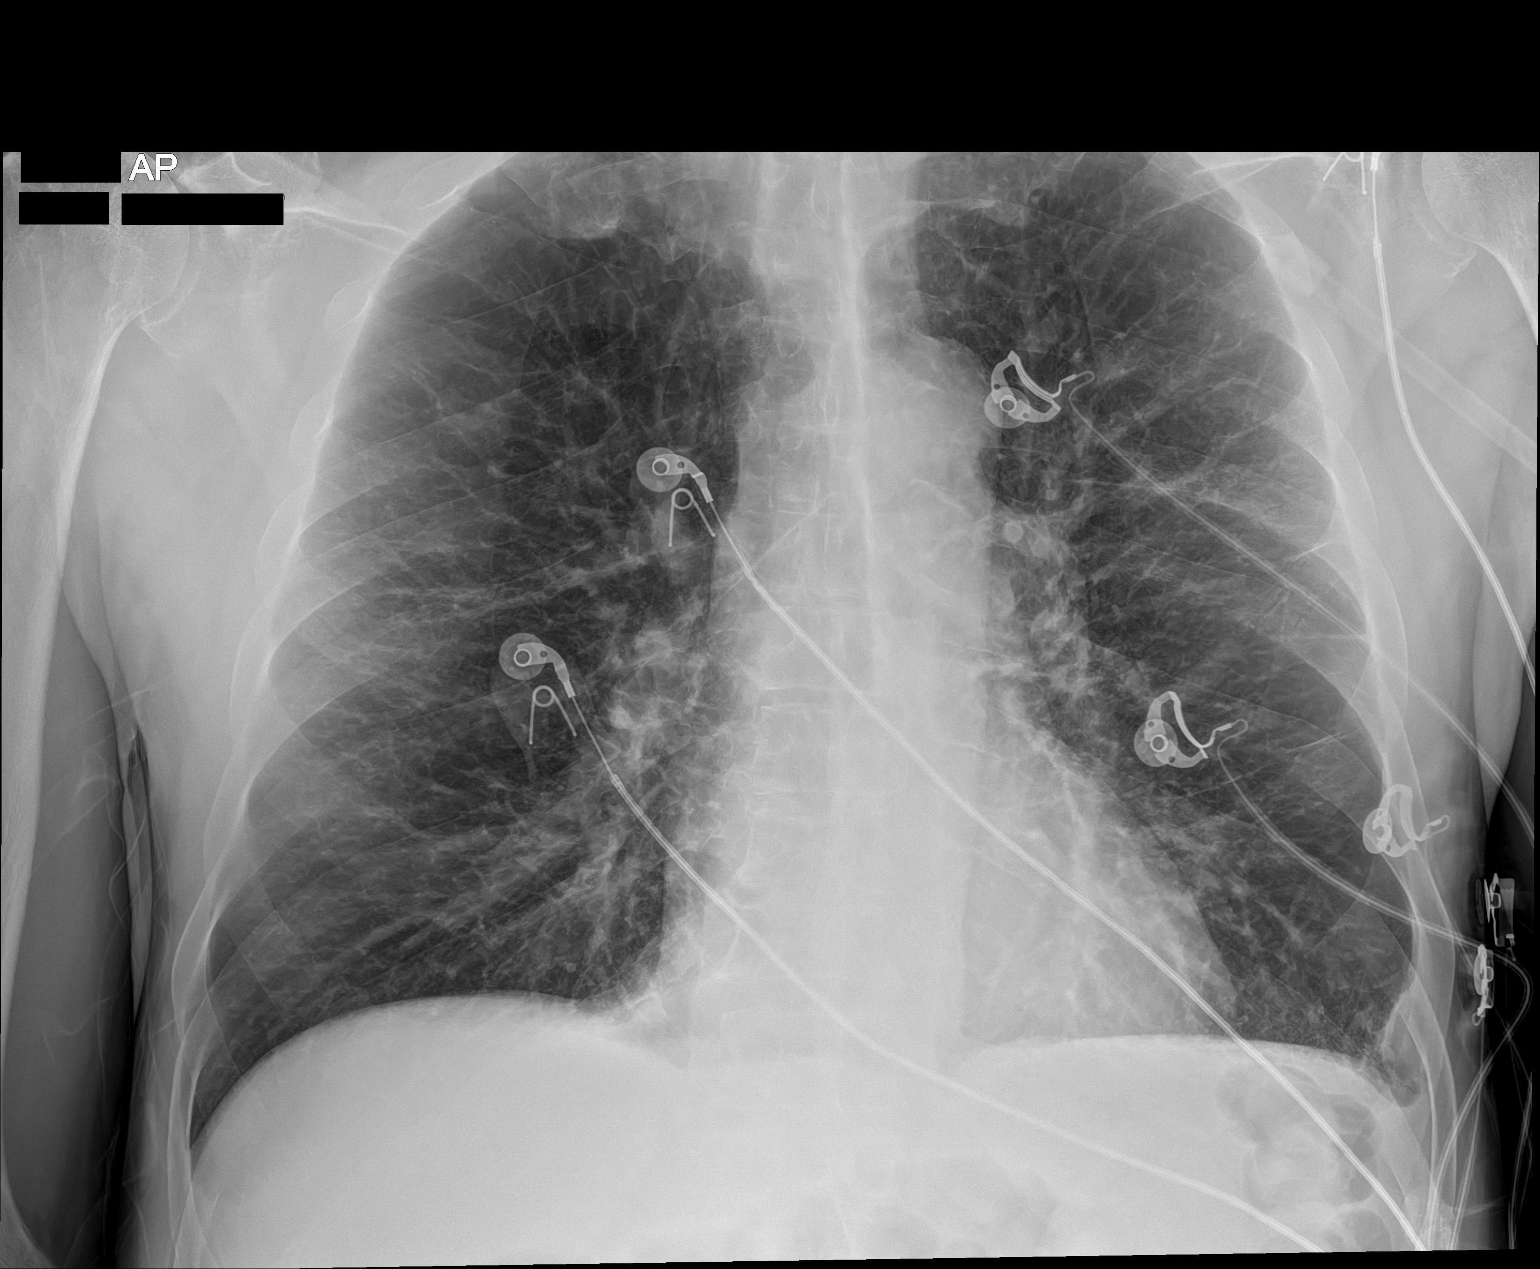

[1 of 1 positions shown; findings below may reference images not displayed]

FINDINGS: Portable AP semi upright view at [HC] hours. Coarse bilateral
pulmonary interstitial opacity appears chronic with emphysema
demonstrated on a [HC] CT. Stable lung volumes and ventilation since
last month. No pneumothorax or pleural effusion. Normal cardiac size
and mediastinal contours. Visualized tracheal air column is within
normal limits. No acute osseous abnormality identified. Negative
visible bowel gas pattern.
IMPRESSION: Emphysema ([HC]-[HC]) with no acute cardiopulmonary abnormality.

## 2021-03-31 IMAGING — CT CT ABD-PELV W/O CM
2 of 4 series · 16 of 46 positions shown, 18 images · non-contrast
Comparison: CT [DATE]

CLINICAL DATA: Abdominal distension, bowel obstruction suspected.

EXAM:
CT ABDOMEN AND PELVIS WITHOUT CONTRAST
TECHNIQUE: Multidetector CT imaging of the abdomen and pelvis was performed
following the standard protocol without IV contrast.

[Series 2: axial st · axial · 0.91mm/px · z∈[+676,+1076]mm · 13 of 91 slices shown, 15 images]
[im 6/91  soft-tissue]
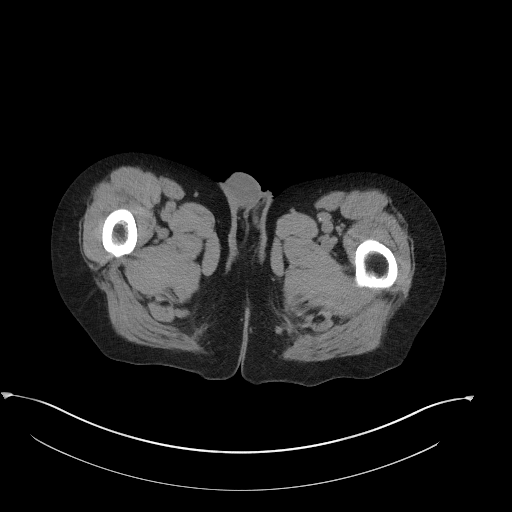
[im 6/91  bone]
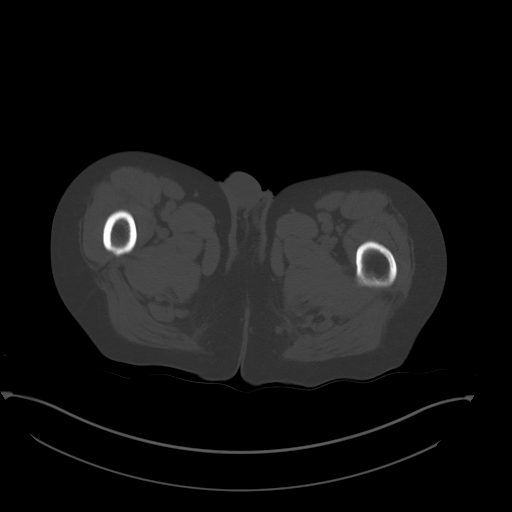
[im 11/91  soft-tissue]
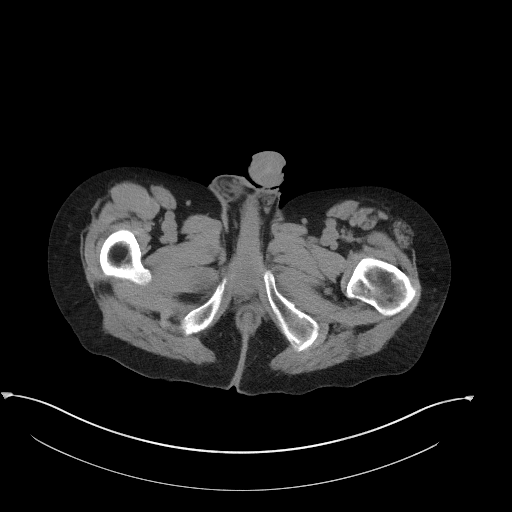
[im 21/91  soft-tissue]
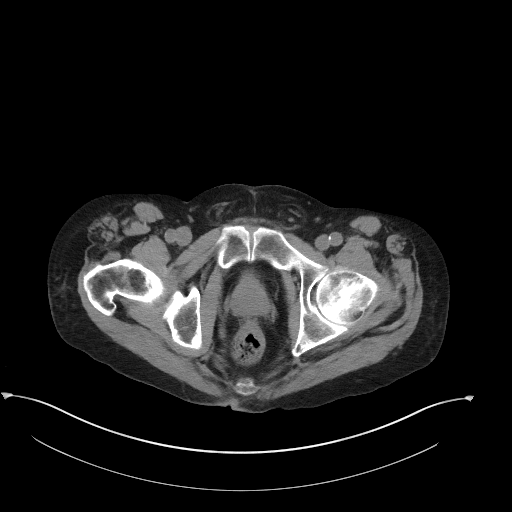
[im 26/91  soft-tissue]
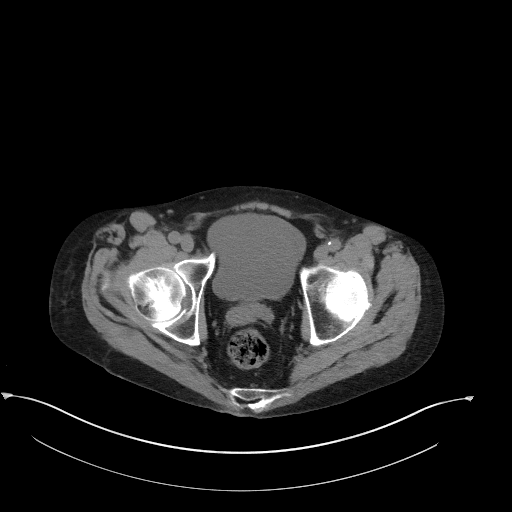
[im 31/91  soft-tissue]
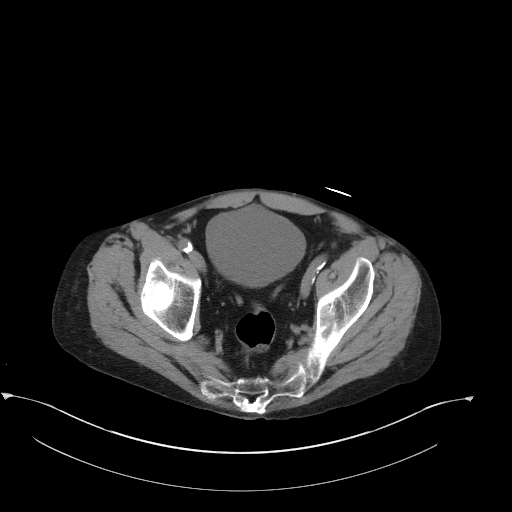
[im 41/91  soft-tissue]
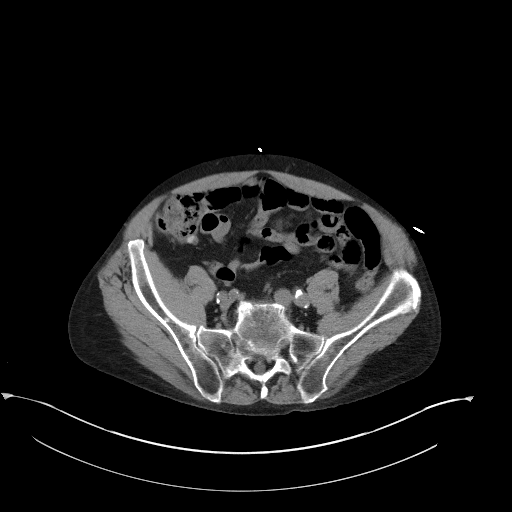
[im 46/91  soft-tissue]
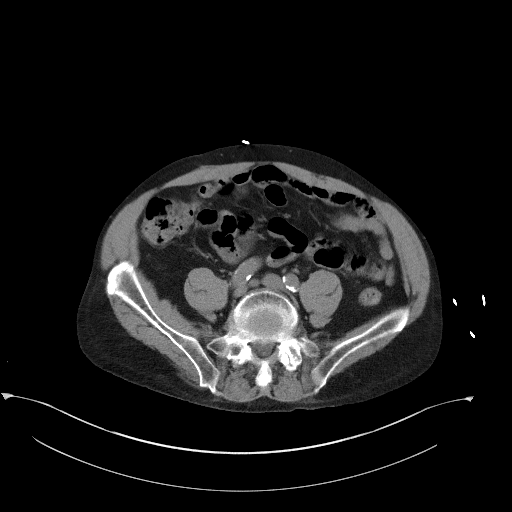
[im 51/91  soft-tissue]
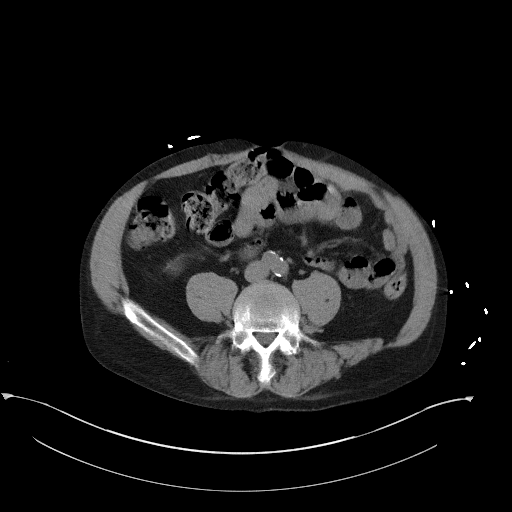
[im 61/91  soft-tissue]
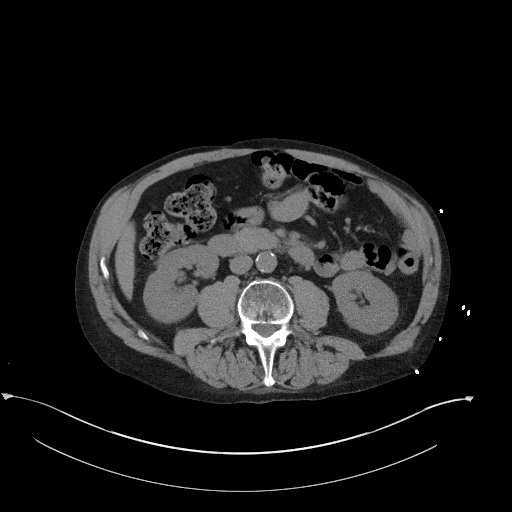
[im 61/91  bone]
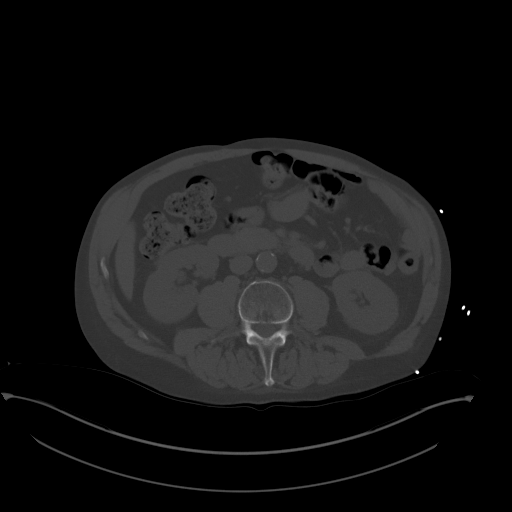
[im 66/91  soft-tissue]
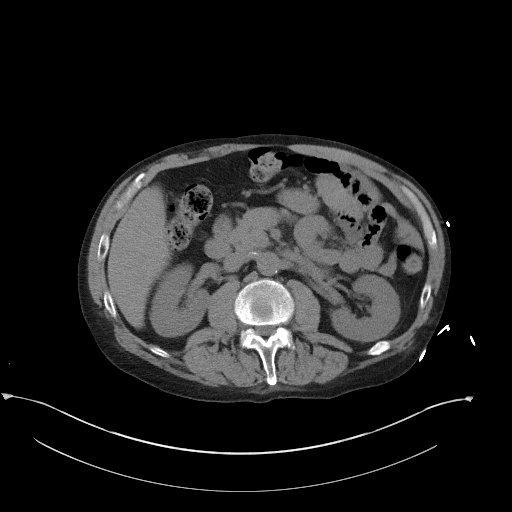
[im 71/91  soft-tissue]
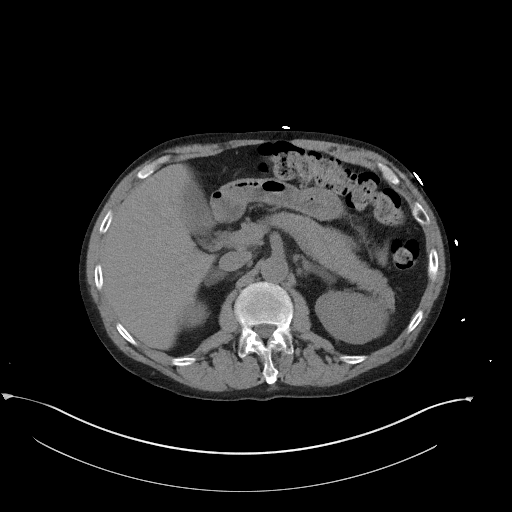
[im 81/91  soft-tissue]
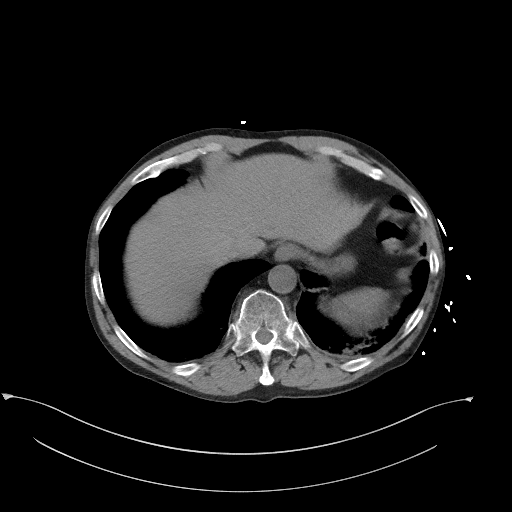
[im 86/91  soft-tissue]
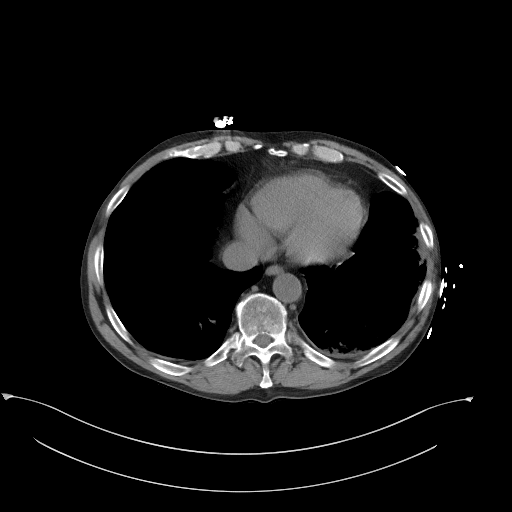

[Series 5: coronal st · coronal · 0.79mm/px · 3 of 102 slices shown]
[im 34/102  soft-tissue]
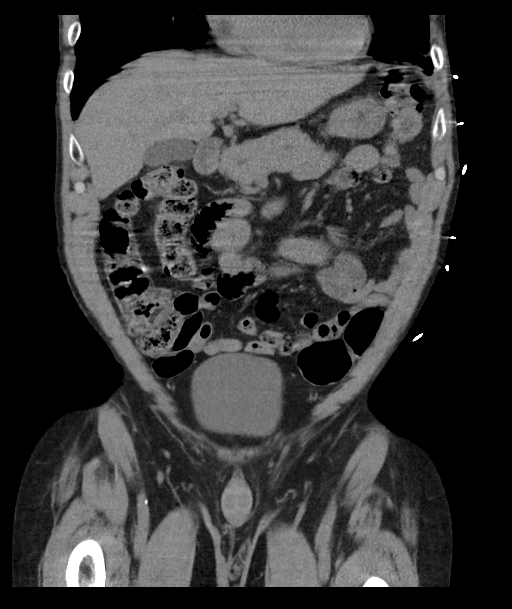
[im 45/102  soft-tissue]
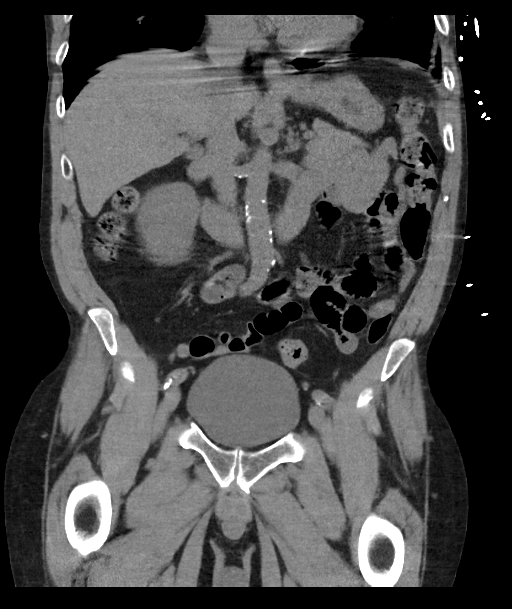
[im 57/102  soft-tissue]
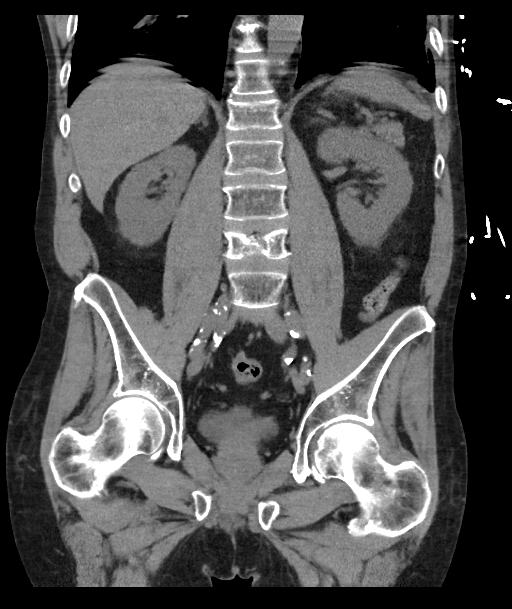

[16 of 46 positions shown; findings below may reference images not displayed]

FINDINGS: Lower chest: Evaluation of the lung bases is limited by respiratory
motion. Bibasilar atelectasis. Normal size heart. No significant
pericardial effusion/thickening.

Hepatobiliary: Unremarkable noncontrast appearance of the hepatic
parenchyma. Gallbladder is grossly unremarkable. No biliary ductal
dilation.

Pancreas: Within normal limits.

Spleen: Within normal limits.

Adrenals/Urinary Tract: Unchanged nodular thickening of the lateral
limbs of the bilateral adrenal glands. No hydronephrosis. No
nephrolithiasis. No contour deforming renal masses. Urinary bladder
is unremarkable.

Stomach/Bowel: Stomach is predominantly decompressed. Normal
positioning of the duodenum/ligament of [place]. No pathologic
dilation of small bowel. The appendix is not definitely visualized
however there is no pericecal inflammation. Small volume of formed
stool in a nondilated colon. The descending and sigmoid colon are
predominantly decompressed limiting evaluation. Scattered colonic
diverticulosis without findings of acute diverticulitis.

Vascular/Lymphatic: Aortic atherosclerosis without aneurysmal
dilation. No pathologically enlarged abdominopelvic lymph nodes.

Reproductive: Prostate is unremarkable.

Other: No abdominopelvic ascites.

Musculoskeletal: New mild anterior compression deformity of the T11
vertebral body with approximately 30% height loss. Stable remote
compression deformity of the L1 vertebral body with 40% height loss.
Similar irregular burst fraction with mild retropulsion of the
inferior endplate of L4. Crescentic sclerosis involving the left
greater than right femoral heads suggestive of avascular necrosis
without evidence of collapse.
IMPRESSION: 1. No evidence of bowel obstruction.
2. New mild anterior compression deformity of the T11 vertebral body
with approximately 30% height loss.
3. Stable remote compression deformity of the L1 vertebral body and
irregular burst fraction with mild retropulsion of the inferior
endplate of L4.
4. Colonic diverticulosis without findings of acute diverticulitis.
5. Aortic atherosclerosis.

Aortic Atherosclerosis ([NA]-[NA]).

## 2021-03-31 MED ORDER — PREDNISONE 10 MG PO TABS: 50.0000 mg | ORAL_TABLET | Freq: Every day | ORAL | 0 refills | Status: DC

## 2021-03-31 MED ORDER — MORPHINE SULFATE (PF) 4 MG/ML IV SOLN: 4.0000 mg | Freq: Once | INTRAVENOUS | Status: DC

## 2021-03-31 MED ORDER — ALBUTEROL SULFATE HFA 108 (90 BASE) MCG/ACT IN AERS
4.0000 | INHALATION_SPRAY | RESPIRATORY_TRACT | Status: AC
  Administered 2021-03-31 (×2): 4 via RESPIRATORY_TRACT
  Filled 2021-03-31: qty 6.7

## 2021-03-31 MED ORDER — PREDNISONE 50 MG PO TABS
60.0000 mg | ORAL_TABLET | Freq: Once | ORAL | Status: AC
  Administered 2021-03-31: 60 mg via ORAL
  Filled 2021-03-31: qty 1

## 2021-03-31 MED ORDER — POTASSIUM CHLORIDE CRYS ER 20 MEQ PO TBCR
40.0000 meq | EXTENDED_RELEASE_TABLET | Freq: Once | ORAL | Status: AC
  Administered 2021-03-31: 40 meq via ORAL
  Filled 2021-03-31: qty 2

## 2021-03-31 MED ORDER — HYDROCODONE-ACETAMINOPHEN 5-325 MG PO TABS
1.0000 | ORAL_TABLET | Freq: Once | ORAL | Status: AC
  Administered 2021-03-31: 1 via ORAL
  Filled 2021-03-31: qty 1

## 2021-03-31 MED ORDER — SODIUM CHLORIDE 0.9 % IV BOLUS
500.0000 mL | Freq: Once | INTRAVENOUS | Status: AC
  Administered 2021-03-31: 500 mL via INTRAVENOUS

## 2021-03-31 MED ORDER — LEVOFLOXACIN 500 MG PO TABS: 500.0000 mg | ORAL_TABLET | Freq: Every day | ORAL | 0 refills | Status: DC

## 2021-03-31 MED ORDER — LEVOFLOXACIN 500 MG PO TABS
500.0000 mg | ORAL_TABLET | Freq: Once | ORAL | Status: AC
  Administered 2021-03-31: 500 mg via ORAL
  Filled 2021-03-31: qty 1

## 2021-03-31 NOTE — ED Provider Notes (Signed)
Emergency Medicine Provider Triage Evaluation Note  Tony Sullivan , a 60 y.o. male  was evaluated in triage.  Pt complains of shoulder pain.  Review of Systems  Positive: Shoulder pain, chest tightness, abdominal pain, cough, shortness of breath, neck pain, headache, abdominal pain, abdominal distension, edema, bed sores.  Negative:   Physical Exam  BP (!) 152/95   Pulse 98   Temp 99.9 F (37.7 C)   Resp (!) 22   Ht 5\' 11"  (1.803 m)   Wt 79 kg   SpO2 91%   BMI 24.29 kg/m  Gen:   Awake, no distress   Resp:  Normal effort, mildly diminished MSK:   Moves extremities without difficulty  Other:  n/a  Medical Decision Making  Medically screening exam initiated at 6:25 AM.  Appropriate orders placed.  Tony Sullivan was informed that the remainder of the evaluation will be completed by another provider, this initial triage assessment does not replace that evaluation, and the importance of remaining in the ED until their evaluation is complete.   Tony Sullivan, [name], MD 03/31/21 [phone]

## 2021-03-31 NOTE — Discharge Instructions (Addendum)
You were seen in the ER for shortness of breath, abdominal discomfort. We suspect that you might have underlying COPD exacerbation.  Take the medications prescribed including the antibiotics.  Return to the ER if your symptoms get worse.

## 2021-03-31 NOTE — ED Notes (Signed)
Per pt, he wears 3-5L O2 depending on how hes feeling.

## 2021-03-31 NOTE — ED Triage Notes (Signed)
BIB [facility] c/o SOB, chest pain, and shoulder pain. 1 neb treatment given PTA.

## 2021-03-31 NOTE — ED Provider Notes (Signed)
[facility] EMERGENCY DEPARTMENT Provider Note   CSN: [id number] Arrival date & time: 03/31/21  7741     History Chief Complaint  Patient presents with  . Shortness of Breath    Tony Sullivan is a 60 y.o. male.  HPI     60 year old male with history of COPD on 3 L of oxygen, diabetes comes in with chief complaint of shortness of breath.  Patient reports that he woke up this morning with some abdominal tightness and epigastric discomfort.  He had associated shortness of breath.  He is feeling better now.  He reports the pain to be generalized over his abdomen with no associated nausea, vomiting or diarrhea.  Patient denies any URI-like symptoms.  He does have a cough, it is slightly worse than usual and there is a phlegm associated with it.  No fevers, chills however.  Patient denies any COVID-19 exposures.  He is also requesting that we check his back, as he might be developing a pressure ulcer.  Past Medical History:  Diagnosis Date  . Anxiety   . Arthritis   . COPD (chronic obstructive pulmonary disease) ([place]) 2004   diagnosed in 2004, no PFT's to date.  Started on home O2 12/2013, after found to be desatting at PCP's office, and referred to pulmonology.  . Depression   . GERD (gastroesophageal reflux disease)   . High blood pressure   . Prediabetes   . Seasonal allergies   . Tobacco dependence     Patient Active Problem List   Diagnosis Date Noted  . Hyperlipidemia 05/24/2020  . Hypokalemia 05/24/2020  . Chest pain 05/23/2020  . Bipolar disorder ([place]) 05/23/2020  . HNP (herniated nucleus pulposus), cervical 09/06/2019  . Abdominal pain   . Poor dentition 11/04/2017  . Acute on chronic respiratory failure with hypoxia ([place]) 08/29/2017  . ETOH abuse 03/30/2017  . Cigarette smoker 03/30/2017  . Acute respiratory failure with hypoxia and hypercapnia ([place]) 03/29/2017  . Special screening for malignant neoplasms, colon   . Benign neoplasm of ascending colon   . Benign  neoplasm of descending colon   . Hoarseness 08/22/2016  . GERD (gastroesophageal reflux disease) 08/22/2016  . Atypical chest pain 12/05/2015  . Essential hypertension 06/15/2014  . Pectoralis muscle strain 01/11/2014  . COPD with acute exacerbation ([place]) 01/10/2014  . COPD  GOLD III 01/08/2014  . Smoker 01/08/2014  . Cough 01/08/2014  . Chronic respiratory failure with hypoxia ([place]) 01/08/2014    Past Surgical History:  Procedure Laterality Date  . ANTERIOR CERVICAL DECOMP/DISCECTOMY FUSION N/A 09/06/2019   Procedure: Anterior Cervical Discecectomy Fusion - Cerivcal Five-Cervical Six;  Surgeon: Kary Kos, MD;  Location: [facility];  Service: Neurosurgery;  Laterality: N/A;  Anterior Cervical Discecectomy Fusion - Cerivcal Five-Cervical Six  . ANTERIOR CERVICAL DISCECTOMY  09/06/2019  . APPENDECTOMY  1980  . COLONOSCOPY WITH PROPOFOL N/A 12/10/2016   Procedure: COLONOSCOPY WITH PROPOFOL;  Surgeon: [name], MD;  Location: [facility];  Service: Endoscopy;  Laterality: N/A;  . HIP SURGERY Right   . SHOULDER ARTHROSCOPY Right        Family History  Problem Relation Age of Onset  . Emphysema Mother   . Allergies Mother        "everyone in family"  . Asthma Mother        "everyone in family"  . Heart disease Mother   . Clotting disorder Mother   . Cancer Mother   . Emphysema Father   . Allergies  Father   . Allergies Son   . Seizures Brother   . Diabetes Brother     Social History   Tobacco Use  . Smoking status: Current Some Day Smoker    Packs/day: 0.00    Years: 36.00    Pack years: 0.00    Types: Cigarettes  . Smokeless tobacco: Never Used  . Tobacco comment: smokes 2 cigarettes per day 11/06/2020  Vaping Use  . Vaping Use: Never used  Substance Use Topics  . Alcohol use: Not Currently    Alcohol/week: 14.0 - 21.0 standard drinks    Types: 14 - 21 Standard drinks or equivalent per week    Comment: 2-3 beers/day, with occasional "binges".  No history of  withdrawal  . Drug use: Not Currently    Types: Marijuana, Cocaine    Comment: last cocaine use last month    Home Medications Prior to Admission medications   Medication Sig Start Date End Date Taking? Authorizing Provider  albuterol (PROAIR HFA) 108 (90 Base) MCG/ACT inhaler Inhale 2 puffs into the lungs every 4 (four) hours as needed. 03/19/21   Fredia Sorrow, MD  albuterol (PROVENTIL) (2.5 MG/3ML) 0.083% nebulizer solution Take 3 mLs (2.5 mg total) by nebulization every 4 (four) hours as needed for wheezing or shortness of breath. 04/28/18   [name], NP  atorvastatin (LIPITOR) 40 MG tablet TAKE ONE TABLET BY MOUTH DAILY Patient taking differently: Take 40 mg by mouth at bedtime. 07/06/20   [name], MD  benztropine (COGENTIN) 0.5 MG tablet Take 0.5 mg by mouth at bedtime.     [provider]  budesonide-formoterol (SYMBICORT) 160-4.5 MCG/ACT inhaler INHALE TWO PUFFS BY MOUTH INTO THE LUNGS TWICE A DAY 03/19/21   Fredia Sorrow, MD  cetirizine (ZYRTEC) 10 MG tablet Take 10 mg by mouth daily.    [provider]  diclofenac Sodium (VOLTAREN) 1 % GEL Apply 2 g topically 4 (four) times daily.    [provider]  dorzolamide (TRUSOPT) 2 % ophthalmic solution Place 1 drop into the left eye 2 (two) times daily. 12/12/20   [provider]  famotidine (PEPCID) 20 MG tablet TAKE 1 TABLET (20 MG TOTAL) BY MOUTH AT BEDTIME. 02/05/21   [name], MD  fluticasone (FLONASE) 50 MCG/ACT nasal spray Place 2 sprays into both nostrils 3 (three) times daily as needed for allergies.     [provider]  hydrochlorothiazide (MICROZIDE) 12.5 MG capsule Take 1 capsule (12.5 mg total) by mouth every morning. 01/29/21   Tolia, Sunit, DO  latanoprost (XALATAN) 0.005 % ophthalmic solution Place 1 drop into both eyes at bedtime. 05/24/19   [provider]  LORazepam (ATIVAN) 1 MG tablet Take 1 tablet (1 mg total) by mouth 3 (three) times daily as  needed for anxiety. 02/15/21   Daleen Bo, MD  losartan (COZAAR) 100 MG tablet Take 100 mg by mouth daily.    [provider]  loxapine (LOXITANE) 10 MG capsule Take 10 mg by mouth at bedtime.     [provider]  megestrol (MEGACE) 40 MG tablet Take 40 mg by mouth daily.    [provider]  mirtazapine (REMERON) 30 MG tablet Take 30 mg by mouth at bedtime.    [provider]  Multiple Vitamin (MULTIVITAMIN) capsule Take 1 capsule by mouth daily.    [provider]  OXYGEN Inhale 2 L into the lungs as directed. 2-3 liters with sleep and exertion as needed for low  oxygen    [provider]  pantoprazole (PROTONIX) 40 MG tablet Take 40 mg by mouth daily.    [provider]  polyethylene glycol (MIRALAX / GLYCOLAX) 17 g packet TAKE 17 GRAMS BY MOUTH DAILY 03/09/20   [name], MD  potassium chloride SA (KLOR-CON) 20 MEQ tablet Take 1 tablet (20 mEq total) by mouth 2 (two) times daily. 02/15/21   Daleen Bo, MD  predniSONE (DELTASONE) 10 MG tablet 1-2 tablets as directed 03/12/21   [name], MD  risperiDONE (RISPERDAL) 2 MG tablet Take 2 mg by mouth at bedtime.    [provider]  Tiotropium Bromide Monohydrate (SPIRIVA RESPIMAT) 2.5 MCG/ACT AERS Inhale 2 puffs into the lungs daily. 03/19/21 04/18/21  Fredia Sorrow, MD    Allergies    Penicillins and Levocetirizine  Review of Systems   Review of Systems  Constitutional: Positive for activity change.  Respiratory: Positive for shortness of breath.   Cardiovascular: Negative for chest pain.  Gastrointestinal: Positive for abdominal pain. Negative for nausea and vomiting.  Musculoskeletal: Positive for back pain.  Allergic/Immunologic: Negative for immunocompromised state.  Hematological: Does not bruise/bleed easily.  All other systems reviewed and are negative.   Physical Exam Updated Vital Signs BP (!) 148/87   Pulse 82   Temp 99.9 F (37.7 C)    Resp 16   Ht 5\' 11"  (1.803 m)   Wt 79 kg   SpO2 94%   BMI 24.29 kg/m   Physical Exam Vitals and nursing note reviewed.  Constitutional:      Appearance: He is well-developed.  HENT:     Head: Atraumatic.  Cardiovascular:     Rate and Rhythm: Normal rate.  Pulmonary:     Effort: Pulmonary effort is normal.     Breath sounds: No decreased breath sounds, wheezing, rhonchi or rales.  Abdominal:     Tenderness: There is abdominal tenderness. There is no guarding or rebound.  Musculoskeletal:     Cervical back: Neck supple.     Right lower leg: No tenderness. No edema.     Left lower leg: No tenderness. No edema.     Comments: Unstageable pressure ulcer as there is some erythema over the sacrum without skin erosion.  Right shoulder, patient is able to forward flex, abduct actively.  I am able to internally and externally rotate with some discomfort.  No shoulder edema or erythema.  Skin:    General: Skin is warm.  Neurological:     Mental Status: He is alert and oriented to person, place, and time.     ED Results / Procedures / Treatments   Labs (all labs ordered are listed, but only abnormal results are displayed) Labs Reviewed  CBC WITH DIFFERENTIAL/PLATELET - Abnormal; Notable for the following components:      Result Value   WBC 21.1 (*)    MCV 100.6 (*)    Neutro Abs 16.6 (*)    Monocytes Absolute 1.3 (*)    Abs Immature Granulocytes 0.13 (*)    All other components within normal limits  COMPREHENSIVE METABOLIC PANEL - Abnormal; Notable for the following components:   Potassium 2.8 (*)    Calcium 8.7 (*)    All other components within normal limits  BRAIN NATRIURETIC PEPTIDE  LIPASE, BLOOD  TROPONIN I (HIGH SENSITIVITY)  TROPONIN I (HIGH SENSITIVITY)    EKG EKG Interpretation  Date/Time:  Saturday March 31 2021 06:14:36 EDT Ventricular Rate:  107 PR Interval:  157 QRS  Duration: 84 QT Interval:  307 QTC Calculation: 423 R Axis:   -89 Text  Interpretation: Fast sinus arrhythmia LAE, consider biatrial enlargement Left anterior fascicular block Anteroseptal infarct, age indeterminate No acute changes Confirmed by Varney Biles [phone]) on 03/31/2021 11:37:00 AM   Radiology CT ABDOMEN PELVIS WO CONTRAST  Result Date: 03/31/2021 CLINICAL DATA:  Abdominal distension, bowel obstruction suspected. EXAM: CT ABDOMEN AND PELVIS WITHOUT CONTRAST TECHNIQUE: Multidetector CT imaging of the abdomen and pelvis was performed following the standard protocol without IV contrast. COMPARISON:  CT August 06, 2019 FINDINGS: Lower chest: Evaluation of the lung bases is limited by respiratory motion. Bibasilar atelectasis. Normal size heart. No significant pericardial effusion/thickening. Hepatobiliary: Unremarkable noncontrast appearance of the hepatic parenchyma. Gallbladder is grossly unremarkable. No biliary ductal dilation. Pancreas: Within normal limits. Spleen: Within normal limits. Adrenals/Urinary Tract: Unchanged nodular thickening of the lateral limbs of the bilateral adrenal glands. No hydronephrosis. No nephrolithiasis. No contour deforming renal masses. Urinary bladder is unremarkable. Stomach/Bowel: Stomach is predominantly decompressed. Normal positioning of the duodenum/ligament of [place]. No pathologic dilation of small bowel. The appendix is not definitely visualized however there is no pericecal inflammation. Small volume of formed stool in a nondilated colon. The descending and sigmoid colon are predominantly decompressed limiting evaluation. Scattered colonic diverticulosis without findings of acute diverticulitis. Vascular/Lymphatic: Aortic atherosclerosis without aneurysmal dilation. No pathologically enlarged abdominopelvic lymph nodes. Reproductive: Prostate is unremarkable. Other: No abdominopelvic ascites. Musculoskeletal: New mild anterior compression deformity of the T11 vertebral body with approximately 30% height loss. Stable remote  compression deformity of the L1 vertebral body with 40% height loss. Similar irregular burst fraction with mild retropulsion of the inferior endplate of L4. Crescentic sclerosis involving the left greater than right femoral heads suggestive of avascular necrosis without evidence of collapse. IMPRESSION: 1. No evidence of bowel obstruction. 2. New mild anterior compression deformity of the T11 vertebral body with approximately 30% height loss. 3. Stable remote compression deformity of the L1 vertebral body and irregular burst fraction with mild retropulsion of the inferior endplate of L4. 4. Colonic diverticulosis without findings of acute diverticulitis. 5. Aortic atherosclerosis. Aortic Atherosclerosis (ICD10-I70.0). Electronically Signed   By: Dahlia Bailiff MD   On: 03/31/2021 09:08   DG Chest Portable 1 View  Result Date: 03/31/2021 CLINICAL DATA:  60 year old male with chest pain, shortness of breath, shoulder pain. EXAM: PORTABLE CHEST 1 VIEW COMPARISON:  Portable chest 03/19/2021 and earlier. FINDINGS: Portable AP semi upright view at 0801 hours. Coarse bilateral pulmonary interstitial opacity appears chronic with emphysema demonstrated on a 2019 CT. Stable lung volumes and ventilation since last month. No pneumothorax or pleural effusion. Normal cardiac size and mediastinal contours. Visualized tracheal air column is within normal limits. No acute osseous abnormality identified. Negative visible bowel gas pattern. IMPRESSION: Emphysema ([id number]) with no acute cardiopulmonary abnormality. Electronically Signed   By: Genevie Ann M.D.   On: 03/31/2021 08:19    Procedures Procedures   Medications Ordered in ED Medications  HYDROcodone-acetaminophen (NORCO/VICODIN) 5-325 MG per tablet 1 tablet (1 tablet Oral Given 03/31/21 0759)  sodium chloride 0.9 % bolus 500 mL (0 mLs Intravenous Stopped 03/31/21 0900)  albuterol (VENTOLIN HFA) 108 (90 Base) MCG/ACT inhaler 4 puff (4 puffs Inhalation Given 03/31/21  0805)  potassium chloride SA (KLOR-CON) CR tablet 40 mEq (40 mEq Oral Given 03/31/21 1027)  levofloxacin (LEVAQUIN) tablet 500 mg (500 mg Oral Given 03/31/21 1122)  predniSONE (DELTASONE) tablet 60 mg (60 mg Oral Given 03/31/21 1122)    ED  Course  I have reviewed the triage vital signs and the nursing notes.  Pertinent labs & imaging results that were available during my care of the patient were reviewed by me and considered in my medical decision making (see chart for details).    MDM Rules/Calculators/A&P                          60 year old comes in with chief complaint of shortness of breath.  He reports that he woke up feeling short of breath because of the abdominal discomfort.  On exam, he has abdominal tenderness without any rebound or guarding.  The tenderness is mostly in the upper quadrant.  -Shoulder pain: Able to forward flex, abduct actively.  Pain into 4 days.  Unable to passively internally and externally rotate.  I do think he might have tendinitis but do not think there is any septic arthritis., neurovascularly intact, advised PCP follow-up - Abd Pain: Cholelithiasis, pancreatitis, PUD considered. SBO also possible. - DIB: He feels better right now.  He has a cough, new sputum.  Advanced COPD with oxygen requirement.  Lung exam is clear, x-ray looks reassuring.  We considered PE in the differential, but appears to be less likely especially since patient never had any pleuritic chest pain, he is comfortable now and he does not have hypoxia with his oxygen.  We will give him breathing treatment and reassess.  11:46 AM Patient's white count was elevated.  I reassessed the patient.  History still not indicative of any specific source besides the increase sputum production and increased cough.  Lung exam is still clear.  No signs of DVT.  BNP is normal, doubt there is a large PE.  Troponin into 1 is also normal.  Heart rate has improved to 80s.  I checked patient's sacral area, no signs  of infection.  Abdominal exam is unchanged, with CT scan that is reassuring.  Plan will be to put patient on Levaquin which should cover both respiratory and GI bugs in case we are missing the source with x-rays and a noncontrasted CT.  Patient certainly does not have any peritonitis and I doubt there is any acute surgical issue in the abdomen.  Advised to follow-up with his PCP and return to the ER if symptoms get worse.   Final Clinical Impression(s) / ED Diagnoses Final diagnoses:  COPD exacerbation ([place])  Bilateral shoulder pain, unspecified chronicity    Rx / DC Orders ED Discharge Orders    None       Varney Biles, MD 03/31/21 1147

## 2021-03-31 NOTE — ED Notes (Signed)
Pt stated that he had a bed sore on his bottom right in the crack, upon visual and and physical evaluation there in no sore of any kind present.

## 2021-04-12 ENCOUNTER — Emergency Department (HOSPITAL_COMMUNITY): Payer: Medicaid Other

## 2021-04-12 ENCOUNTER — Other Ambulatory Visit: Payer: Self-pay

## 2021-04-12 ENCOUNTER — Encounter (HOSPITAL_COMMUNITY): Payer: Self-pay

## 2021-04-12 ENCOUNTER — Emergency Department (HOSPITAL_COMMUNITY)
Admission: EM | Admit: 2021-04-12 | Discharge: 2021-04-12 | Disposition: A | Discharge status: Home / Self Care | Payer: Medicaid Other | Attending: Emergency Medicine | Admitting: Emergency Medicine

## 2021-04-12 DIAGNOSIS — Z20822 Contact with and (suspected) exposure to covid-19: Secondary | ICD-10-CM | POA: Insufficient documentation

## 2021-04-12 DIAGNOSIS — J441 Chronic obstructive pulmonary disease with (acute) exacerbation: Secondary | ICD-10-CM | POA: Insufficient documentation

## 2021-04-12 DIAGNOSIS — Z79899 Other long term (current) drug therapy: Secondary | ICD-10-CM | POA: Insufficient documentation

## 2021-04-12 DIAGNOSIS — F17210 Nicotine dependence, cigarettes, uncomplicated: Secondary | ICD-10-CM | POA: Diagnosis not present

## 2021-04-12 DIAGNOSIS — Z7951 Long term (current) use of inhaled steroids: Secondary | ICD-10-CM | POA: Diagnosis not present

## 2021-04-12 DIAGNOSIS — R0602 Shortness of breath: Secondary | ICD-10-CM | POA: Diagnosis present

## 2021-04-12 DIAGNOSIS — Z86018 Personal history of other benign neoplasm: Secondary | ICD-10-CM | POA: Diagnosis not present

## 2021-04-12 DIAGNOSIS — I10 Essential (primary) hypertension: Secondary | ICD-10-CM | POA: Diagnosis not present

## 2021-04-12 LAB — CBC WITH DIFFERENTIAL/PLATELET
Abs Immature Granulocytes: 0.25 10*3/uL — ABNORMAL HIGH (ref 0.00–0.07)
Basophils Absolute: 0.1 10*3/uL (ref 0.0–0.1)
Basophils Relative: 1 %
Eosinophils Absolute: 0.1 10*3/uL (ref 0.0–0.5)
Eosinophils Relative: 1 %
HCT: 46.3 % (ref 39.0–52.0)
Hemoglobin: 14.6 g/dL (ref 13.0–17.0)
Immature Granulocytes: 2 %
Lymphocytes Relative: 15 %
Lymphs Abs: 2.6 10*3/uL (ref 0.7–4.0)
MCH: 31.3 pg (ref 26.0–34.0)
MCHC: 31.5 g/dL (ref 30.0–36.0)
MCV: 99.1 fL (ref 80.0–100.0)
Monocytes Absolute: 1.2 10*3/uL — ABNORMAL HIGH (ref 0.1–1.0)
Monocytes Relative: 7 %
Neutro Abs: 13 10*3/uL — ABNORMAL HIGH (ref 1.7–7.7)
Neutrophils Relative %: 74 %
Platelets: 272 10*3/uL (ref 150–400)
RBC: 4.67 MIL/uL (ref 4.22–5.81)
RDW: 14.6 % (ref 11.5–15.5)
WBC: 17.2 10*3/uL — ABNORMAL HIGH (ref 4.0–10.5)
nRBC: 0 % (ref 0.0–0.2)

## 2021-04-12 LAB — COMPREHENSIVE METABOLIC PANEL
ALT: 26 U/L (ref 0–44)
AST: 24 U/L (ref 15–41)
Albumin: 3.5 g/dL (ref 3.5–5.0)
Alkaline Phosphatase: 69 U/L (ref 38–126)
Anion gap: 7 (ref 5–15)
BUN: 18 mg/dL (ref 6–20)
CO2: 32 mmol/L (ref 22–32)
Calcium: 8.7 mg/dL — ABNORMAL LOW (ref 8.9–10.3)
Chloride: 99 mmol/L (ref 98–111)
Creatinine, Ser: 0.97 mg/dL (ref 0.61–1.24)
GFR, Estimated: >60 mL/min (ref >60)
Glucose, Bld: 100 mg/dL — ABNORMAL HIGH (ref 70–99)
Potassium: 2.9 mmol/L — ABNORMAL LOW (ref 3.5–5.1)
Sodium: 138 mmol/L (ref 135–145)
Total Bilirubin: 0.8 mg/dL (ref 0.3–1.2)
Total Protein: 6.6 g/dL (ref 6.5–8.1)

## 2021-04-12 LAB — POC SARS CORONAVIRUS 2 AG -  ED: SARSCOV2ONAVIRUS 2 AG: NEGATIVE

## 2021-04-12 IMAGING — DX DG CHEST 1V PORT
2 series · 2 of 2 positions shown · non-contrast
Comparison: Portable chest [DATE] and earlier.

CLINICAL DATA: 59-year-old male with increase shortness of breath.

EXAM:
PORTABLE CHEST 1 VIEW

[chest ap (1 of 2)]
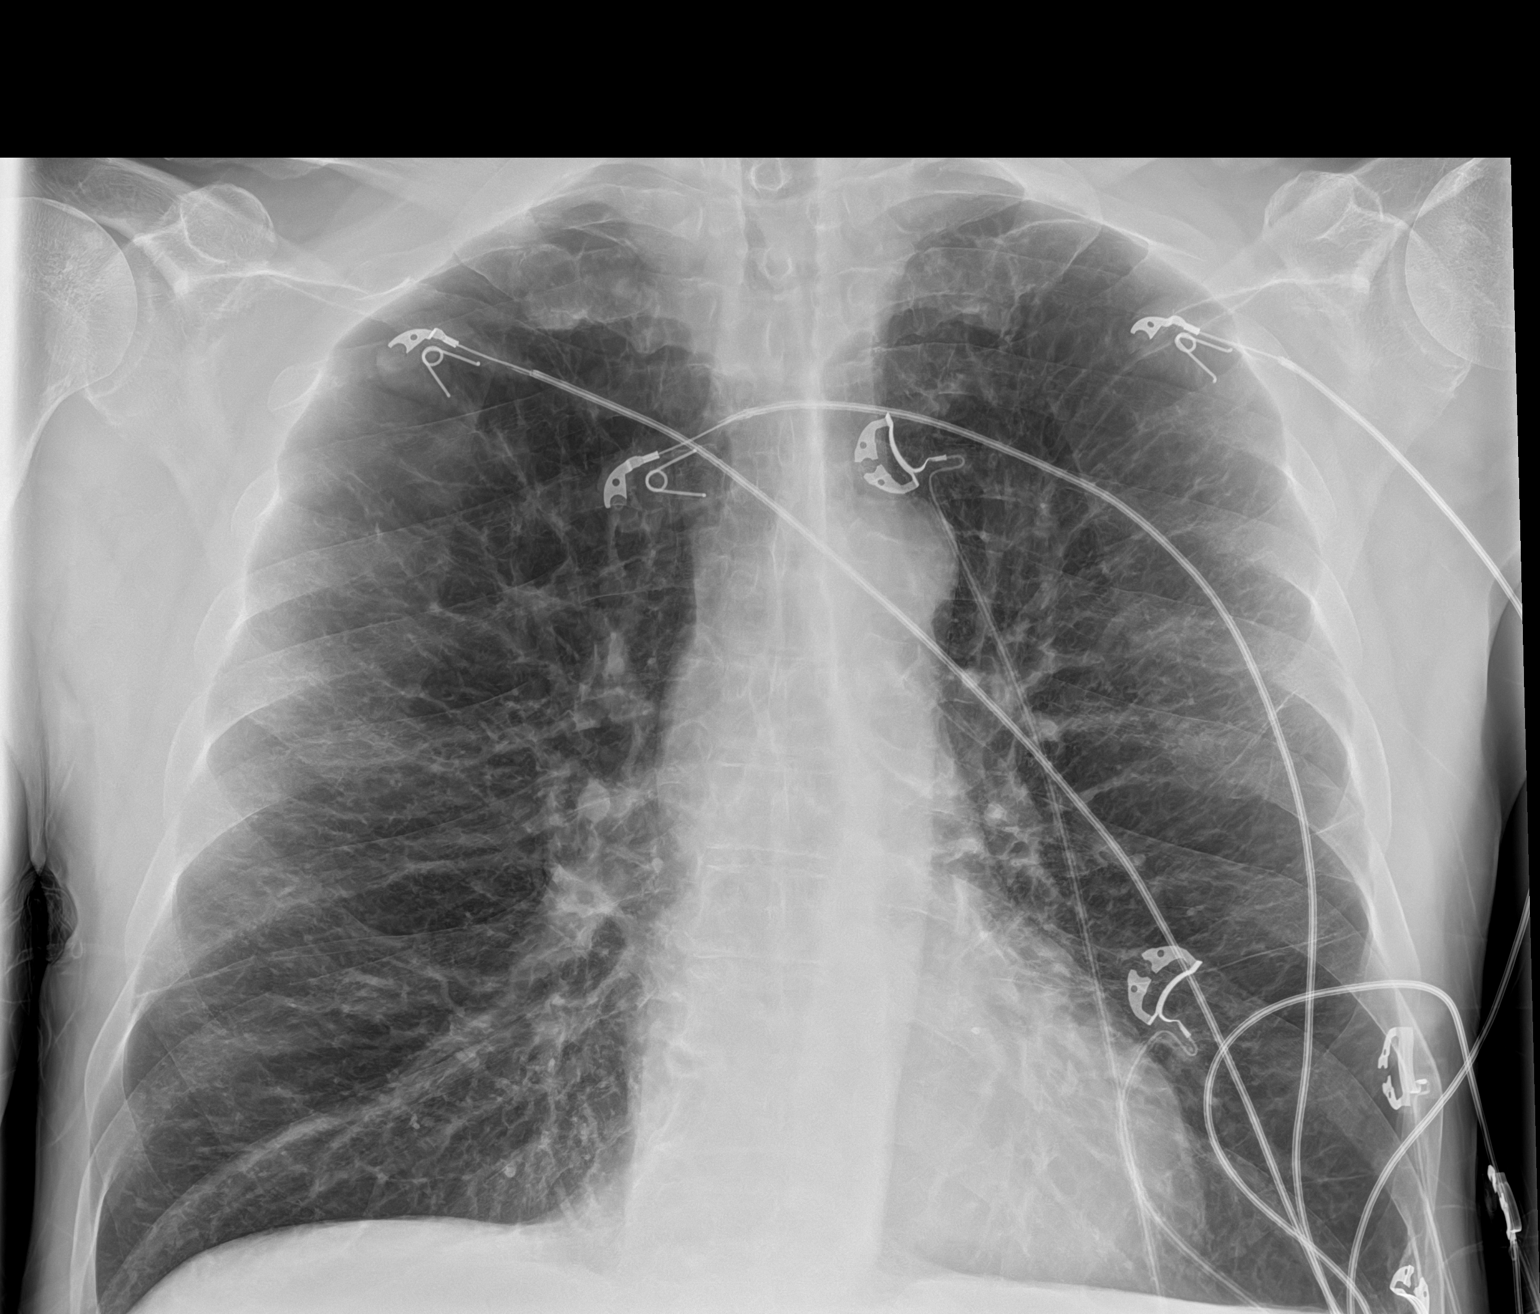

[chest ap (2 of 2)]
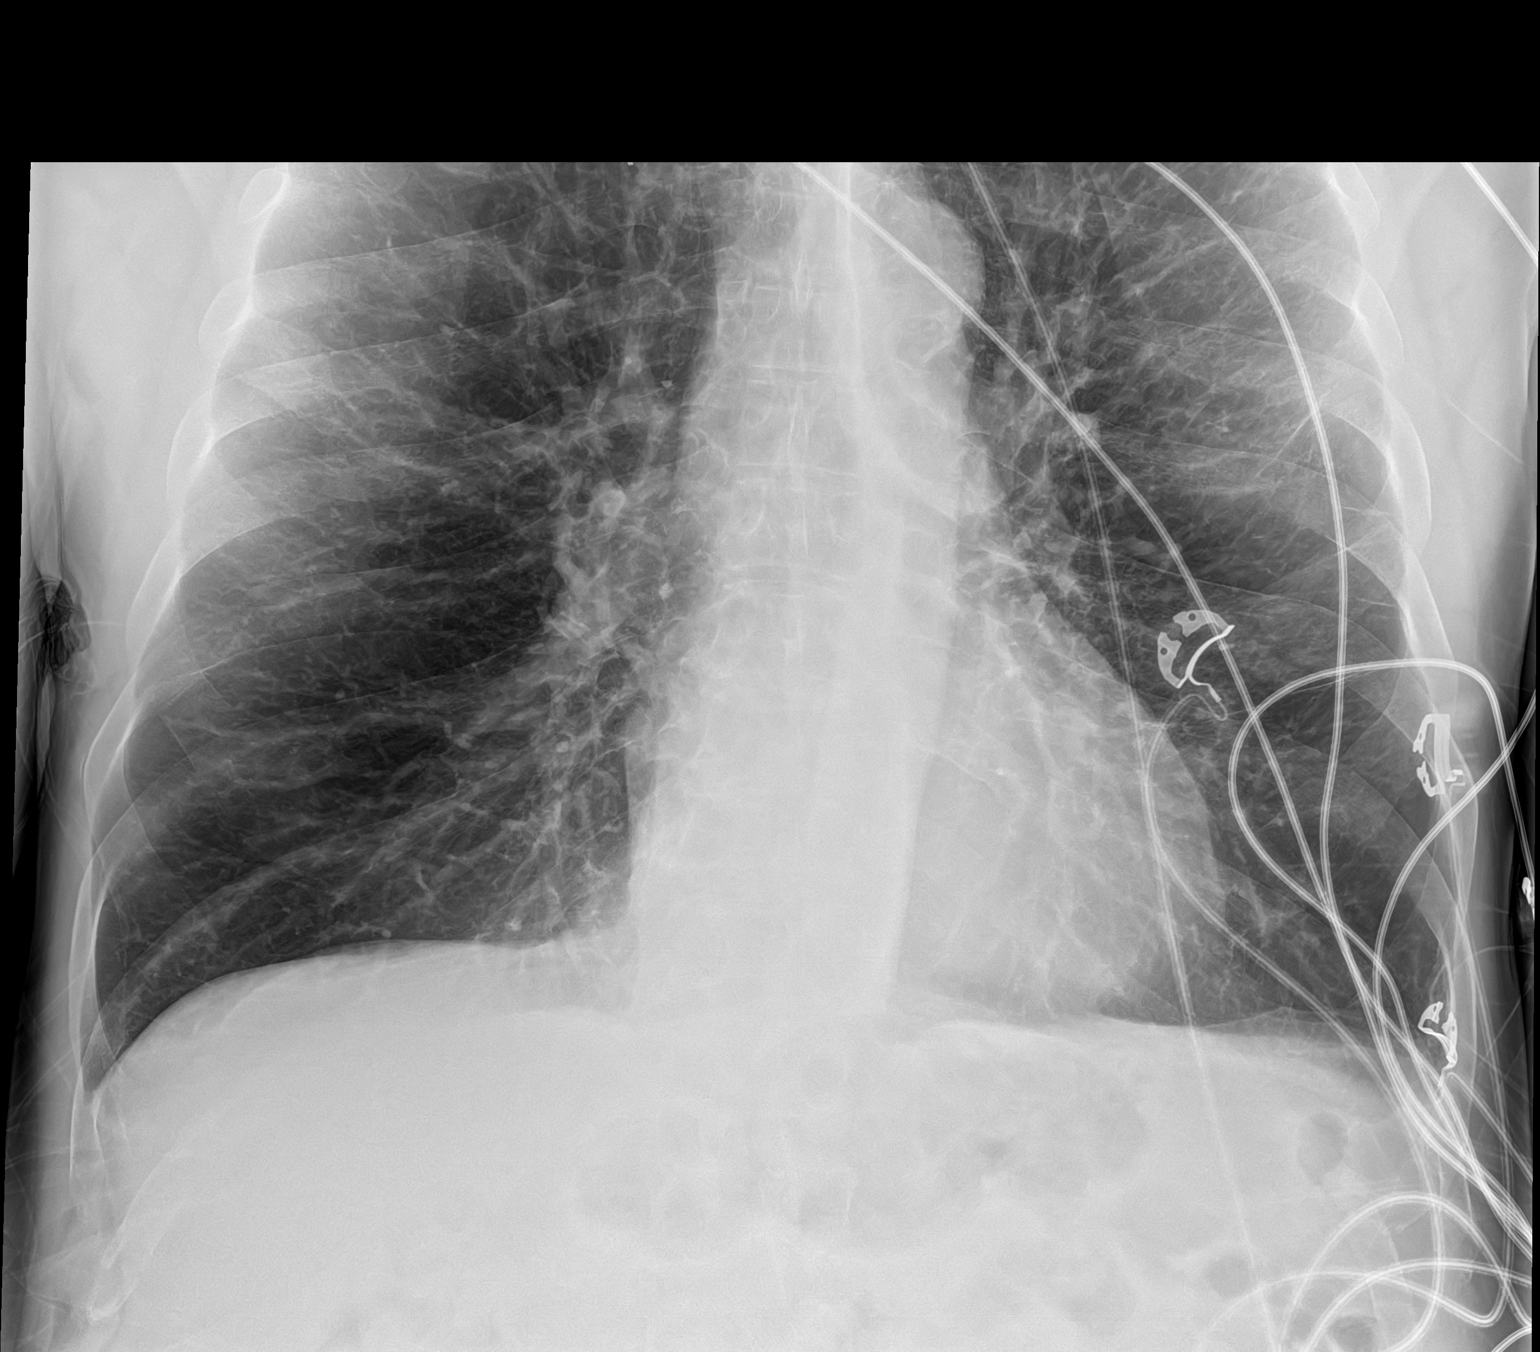

[2 of 2 positions shown; findings below may reference images not displayed]

FINDINGS: Portable AP upright view at [PS] hours. Stable large lung volumes,
coarse pulmonary interstitial opacity and attenuation of upper lobe
bronchovascular markings in keeping with the centrilobular emphysema
demonstrated by CT in [PS]. Mediastinal contours remain normal.
Visualized tracheal air column is within normal limits. No
pneumothorax or acute pulmonary opacity. Partially visible ACDF. No
acute osseous abnormality identified.
IMPRESSION: Emphysema ([PS]-[PS]). No acute cardiopulmonary abnormality.

## 2021-04-12 MED ORDER — ALBUTEROL SULFATE HFA 108 (90 BASE) MCG/ACT IN AERS
INHALATION_SPRAY | RESPIRATORY_TRACT | Status: AC
  Filled 2021-04-12: qty 6.7

## 2021-04-12 MED ORDER — ALBUTEROL SULFATE (2.5 MG/3ML) 0.083% IN NEBU
2.5000 mg | INHALATION_SOLUTION | Freq: Once | RESPIRATORY_TRACT | Status: AC
  Administered 2021-04-12: 2.5 mg via RESPIRATORY_TRACT
  Filled 2021-04-12: qty 3

## 2021-04-12 MED ORDER — MAGNESIUM SULFATE 2 GM/50ML IV SOLN
2.0000 g | Freq: Once | INTRAVENOUS | Status: AC
  Administered 2021-04-12: 2 g via INTRAVENOUS
  Filled 2021-04-12: qty 50

## 2021-04-12 MED ORDER — IPRATROPIUM-ALBUTEROL 0.5-2.5 (3) MG/3ML IN SOLN
3.0000 mL | Freq: Once | RESPIRATORY_TRACT | Status: AC
  Administered 2021-04-12: 3 mL via RESPIRATORY_TRACT
  Filled 2021-04-12: qty 3

## 2021-04-12 MED ORDER — ALBUTEROL SULFATE HFA 108 (90 BASE) MCG/ACT IN AERS
2.0000 | INHALATION_SPRAY | Freq: Once | RESPIRATORY_TRACT | Status: AC
  Administered 2021-04-12: 2 via RESPIRATORY_TRACT
  Filled 2021-04-12: qty 6.7

## 2021-04-12 MED ORDER — PREDNISONE 10 MG PO TABS: 20.0000 mg | ORAL_TABLET | Freq: Every day | ORAL | 0 refills | Status: DC

## 2021-04-12 MED ORDER — METHYLPREDNISOLONE SODIUM SUCC 125 MG IJ SOLR
125.0000 mg | Freq: Once | INTRAMUSCULAR | Status: AC
  Administered 2021-04-12: 125 mg via INTRAVENOUS
  Filled 2021-04-12: qty 2

## 2021-04-12 NOTE — ED Triage Notes (Signed)
Pt presents to ED with increased SOB since this am. Pt brought himself to ED in power wheelchair. O2 noted to be out upon arrival. Pt states he is chronically on 3-4L of O2 at home.

## 2021-04-12 NOTE — ED Provider Notes (Signed)
[facility] EMERGENCY DEPARTMENT Provider Note   CSN: [id number] Arrival date & time: 04/12/21  0849     History Chief Complaint  Patient presents with   Shortness of Breath    Tony Sullivan is a 60 y.o. male.  Patient complains of short of breath and wheezing..  Patient had a run out of his inhaler.  He has a history of COPD  The history is provided by the patient and medical records. No language interpreter was used.  Shortness of Breath Severity:  Moderate Onset quality:  Sudden Duration: 3 days. Timing:  Constant Progression:  Waxing and waning Chronicity:  Recurrent Context: activity   Relieved by:  Nothing Worsened by:  Nothing Ineffective treatments:  Inhaler Associated symptoms: no abdominal pain, no chest pain, no cough, no headaches and no rash       Past Medical History:  Diagnosis Date   Anxiety    Arthritis    COPD (chronic obstructive pulmonary disease) ([place]) 2004   diagnosed in 2004, no PFT's to date.  Started on home O2 12/2013, after found to be desatting at PCP's office, and referred to pulmonology.   Depression    GERD (gastroesophageal reflux disease)    High blood pressure    Prediabetes    Seasonal allergies    Tobacco dependence     Patient Active Problem List   Diagnosis Date Noted   Hyperlipidemia 05/24/2020   Hypokalemia 05/24/2020   Chest pain 05/23/2020   Bipolar disorder ([place]) 05/23/2020   HNP (herniated nucleus pulposus), cervical 09/06/2019   Abdominal pain    Poor dentition 11/04/2017   Acute on chronic respiratory failure with hypoxia ([place]) 08/29/2017   ETOH abuse 03/30/2017   Cigarette smoker 03/30/2017   Acute respiratory failure with hypoxia and hypercapnia (HCC) 03/29/2017   Special screening for malignant neoplasms, colon    Benign neoplasm of ascending colon    Benign neoplasm of descending colon    Hoarseness 08/22/2016   GERD (gastroesophageal reflux disease) 08/22/2016   Atypical chest pain 12/05/2015    Essential hypertension 06/15/2014   Pectoralis muscle strain 01/11/2014   COPD with acute exacerbation ([place]) 01/10/2014   COPD  GOLD III 01/08/2014   Smoker 01/08/2014   Cough 01/08/2014   Chronic respiratory failure with hypoxia ([place]) 01/08/2014    Past Surgical History:  Procedure Laterality Date   ANTERIOR CERVICAL DECOMP/DISCECTOMY FUSION N/A 09/06/2019   Procedure: Anterior Cervical Discecectomy Fusion - Cerivcal Five-Cervical Six;  Surgeon: Kary Kos, MD;  Location: [facility];  Service: Neurosurgery;  Laterality: N/A;  Anterior Cervical Discecectomy Fusion - Cerivcal Five-Cervical Six   ANTERIOR CERVICAL DISCECTOMY  09/06/2019   APPENDECTOMY  1980   COLONOSCOPY WITH PROPOFOL N/A 12/10/2016   Procedure: COLONOSCOPY WITH PROPOFOL;  Surgeon: [name], MD;  Location: [facility];  Service: Endoscopy;  Laterality: N/A;   HIP SURGERY Right    SHOULDER ARTHROSCOPY Right        Family History  Problem Relation Age of Onset   Emphysema Mother    Allergies Mother        "everyone in family"   Asthma Mother        "everyone in family"   Heart disease Mother    Clotting disorder Mother    Cancer Mother    Emphysema Father    Allergies Father    Allergies Son    Seizures Brother    Diabetes Brother     Social History   Tobacco Use  Smoking status: Some Days    Packs/day: 0.00    Years: 36.00    Pack years: 0.00    Types: Cigarettes   Smokeless tobacco: Never   Tobacco comments:    smokes 2 cigarettes per day 11/06/2020  Vaping Use   Vaping Use: Never used  Substance Use Topics   Alcohol use: Not Currently    Alcohol/week: 14.0 - 21.0 standard drinks    Types: 14 - 21 Standard drinks or equivalent per week    Comment: 2-3 beers/day, with occasional "binges".  No history of withdrawal   Drug use: Not Currently    Types: Marijuana, Cocaine    Comment: last cocaine use last month    Home Medications Prior to Admission medications   Medication Sig Start Date End  Date Taking? Authorizing Provider  albuterol (PROAIR HFA) 108 (90 Base) MCG/ACT inhaler Inhale 2 puffs into the lungs every 4 (four) hours as needed. 03/19/21  Yes Fredia Sorrow, MD  albuterol (PROVENTIL) (2.5 MG/3ML) 0.083% nebulizer solution Take 3 mLs (2.5 mg total) by nebulization every 4 (four) hours as needed for wheezing or shortness of breath. 04/28/18  Yes [name], NP  atorvastatin (LIPITOR) 40 MG tablet TAKE ONE TABLET BY MOUTH DAILY Patient taking differently: Take 40 mg by mouth at bedtime. 07/06/20  Yes [name], MD  benztropine (COGENTIN) 0.5 MG tablet Take 0.5 mg by mouth at bedtime.    Yes [provider]  budesonide-formoterol (SYMBICORT) 160-4.5 MCG/ACT inhaler INHALE TWO PUFFS BY MOUTH INTO THE LUNGS TWICE A DAY 03/19/21  Yes Fredia Sorrow, MD  cetirizine (ZYRTEC) 10 MG tablet Take 10 mg by mouth daily.   Yes [provider]  diclofenac Sodium (VOLTAREN) 1 % GEL Apply 2 g topically 4 (four) times daily.   Yes [provider]  dorzolamide (TRUSOPT) 2 % ophthalmic solution Place 1 drop into the left eye 2 (two) times daily. 12/12/20  Yes [provider]  famotidine (PEPCID) 20 MG tablet TAKE 1 TABLET (20 MG TOTAL) BY MOUTH AT BEDTIME. 02/05/21  Yes [name], MD  fluticasone (FLONASE) 50 MCG/ACT nasal spray Place 2 sprays into both nostrils 3 (three) times daily as needed for allergies.    Yes [provider]  hydrochlorothiazide (MICROZIDE) 12.5 MG capsule Take 1 capsule (12.5 mg total) by mouth every morning. 01/29/21  Yes Tolia, Sunit, DO  latanoprost (XALATAN) 0.005 % ophthalmic solution Place 1 drop into both eyes at bedtime. 05/24/19  Yes [provider]  LORazepam (ATIVAN) 1 MG tablet Take 1 tablet (1 mg total) by mouth 3 (three) times daily as needed for anxiety. 02/15/21  Yes Daleen Bo, MD  losartan (COZAAR) 100 MG tablet Take 100 mg by mouth daily.   Yes [provider]  loxapine  (LOXITANE) 10 MG capsule Take 10 mg by mouth at bedtime.    Yes [provider]  megestrol (MEGACE) 40 MG tablet Take 40 mg by mouth daily.   Yes [provider]  mirtazapine (REMERON) 30 MG tablet Take 30 mg by mouth at bedtime.   Yes [provider]  Multiple Vitamin (MULTIVITAMIN) capsule Take 1 capsule by mouth daily.   Yes [provider]  OXYGEN Inhale 3-4 L into the lungs as directed. 3-4 liters with sleep and exertion as needed for low oxygen   Yes [provider]  pantoprazole (PROTONIX) 40 MG tablet Take 40 mg by mouth daily.   Yes [provider]  polyethylene glycol (  MIRALAX / GLYCOLAX) 17 g packet TAKE 17 GRAMS BY MOUTH DAILY 03/09/20  Yes [name], MD  predniSONE (DELTASONE) 10 MG tablet Take 5 tablets (50 mg total) by mouth daily. Patient taking differently: Take 20 mg by mouth daily. 04/01/21  Yes Varney Biles, MD  risperiDONE (RISPERDAL) 2 MG tablet Take 2 mg by mouth at bedtime.   Yes [provider]  Tiotropium Bromide Monohydrate (SPIRIVA RESPIMAT) 2.5 MCG/ACT AERS Inhale 2 puffs into the lungs daily. 03/19/21 04/18/21 Yes Fredia Sorrow, MD  levofloxacin (LEVAQUIN) 500 MG tablet Take 1 tablet (500 mg total) by mouth daily. Patient not taking: Reported on 04/12/2021 04/01/21   Varney Biles, MD  potassium chloride SA (KLOR-CON) 20 MEQ tablet Take 1 tablet (20 mEq total) by mouth 2 (two) times daily. Patient not taking: No sig reported 02/15/21   Daleen Bo, MD    Allergies    Penicillins and Levocetirizine  Review of Systems   Review of Systems  Constitutional:  Negative for appetite change and fatigue.  HENT:  Negative for congestion, ear discharge and sinus pressure.   Eyes:  Negative for discharge.  Respiratory:  Positive for shortness of breath. Negative for cough.   Cardiovascular:  Negative for chest pain.  Gastrointestinal:  Negative for abdominal pain and diarrhea.  Genitourinary:  Negative  for frequency and hematuria.  Musculoskeletal:  Negative for back pain.  Skin:  Negative for rash.  Neurological:  Negative for seizures and headaches.  Psychiatric/Behavioral:  Negative for hallucinations.    Physical Exam Updated Vital Signs BP 104/70   Pulse 77   Temp 98 F (36.7 C) (Oral)   Resp 19   Ht 5\' 11"  (1.803 m)   Wt 79 kg   SpO2 95%   BMI 24.29 kg/m   Physical Exam Vitals and nursing note reviewed.  Constitutional:      Appearance: He is well-developed.  HENT:     Head: Normocephalic.     Nose: Nose normal.  Eyes:     General: No scleral icterus.    Conjunctiva/sclera: Conjunctivae normal.  Neck:     Thyroid: No thyromegaly.  Cardiovascular:     Rate and Rhythm: Normal rate and regular rhythm.     Heart sounds: No murmur heard.   No friction rub. No gallop.  Pulmonary:     Breath sounds: No stridor. Wheezing present. No rales.  Chest:     Chest wall: No tenderness.  Abdominal:     General: There is no distension.     Tenderness: There is no abdominal tenderness. There is no rebound.  Musculoskeletal:        General: Normal range of motion.     Cervical back: Neck supple.  Lymphadenopathy:     Cervical: No cervical adenopathy.  Skin:    Findings: No erythema or rash.  Neurological:     Mental Status: He is alert and oriented to person, place, and time.     Motor: No abnormal muscle tone.     Coordination: Coordination normal.  Psychiatric:        Behavior: Behavior normal.    ED Results / Procedures / Treatments   Labs (all labs ordered are listed, but only abnormal results are displayed) Labs Reviewed  CBC WITH DIFFERENTIAL/PLATELET - Abnormal; Notable for the following components:      Result Value   WBC 17.2 (*)    Neutro Abs 13.0 (*)    Monocytes Absolute 1.2 (*)    Abs Immature  Granulocytes 0.25 (*)    All other components within normal limits  COMPREHENSIVE METABOLIC PANEL - Abnormal; Notable for the following components:    Potassium 2.9 (*)    Glucose, Bld 100 (*)    Calcium 8.7 (*)    All other components within normal limits  POC SARS CORONAVIRUS 2 AG -  ED    EKG None  Radiology DG Chest Port 1 View  Result Date: 04/12/2021 CLINICAL DATA:  60 year old male with increase shortness of breath. EXAM: PORTABLE CHEST 1 VIEW COMPARISON:  Portable chest 03/31/2021 and earlier. FINDINGS: Portable AP upright view at 0919 hours. Stable large lung volumes, coarse pulmonary interstitial opacity and attenuation of upper lobe bronchovascular markings in keeping with the centrilobular emphysema demonstrated by CT in 2019. Mediastinal contours remain normal. Visualized tracheal air column is within normal limits. No pneumothorax or acute pulmonary opacity. Partially visible ACDF. No acute osseous abnormality identified. IMPRESSION: Emphysema (ICD10-J43.9). No acute cardiopulmonary abnormality. Electronically Signed   By: Genevie Ann M.D.   On: 04/12/2021 09:31    Procedures Procedures   Medications Ordered in ED Medications  methylPREDNISolone sodium succinate (SOLU-MEDROL) 125 mg/2 mL injection 125 mg (125 mg Intravenous Given 04/12/21 1014)  magnesium sulfate IVPB 2 g 50 mL (0 g Intravenous Stopped 04/12/21 1119)  ipratropium-albuterol (DUONEB) 0.5-2.5 (3) MG/3ML nebulizer solution 3 mL (3 mLs Nebulization Given 04/12/21 1033)  albuterol (PROVENTIL) (2.5 MG/3ML) 0.083% nebulizer solution 2.5 mg (2.5 mg Nebulization Given 04/12/21 1033)  albuterol (VENTOLIN HFA) 108 (90 Base) MCG/ACT inhaler 2 puff (2 puffs Inhalation Not Given 04/12/21 1333)    ED Course  I have reviewed the triage vital signs and the nursing notes.  Pertinent labs & imaging results that were available during my care of the patient were reviewed by me and considered in my medical decision making (see chart for details).    MDM Rules/Calculators/A&P                         Patient was treated for exacerbation of COPD.  Patient felt much better after  treatment and was no longer short of breath with minimal wheezing.  Patient wanted to go home and was sent home with an inhaler and prednisone  Final Clinical Impression(s) / ED Diagnoses Final diagnoses:  COPD exacerbation [facility])    Rx / DC Orders ED Discharge Orders     None        [name], MD 04/16/21 1644

## 2021-04-12 NOTE — Discharge Instructions (Addendum)
Follow-up with your doctor if any more problems.  Use the inhaler every 6 hours as needed for wheezing

## 2021-04-18 ENCOUNTER — Ambulatory Visit: Payer: Medicaid Other | Admitting: Internal Medicine

## 2021-04-25 ENCOUNTER — Ambulatory Visit: Payer: Medicaid Other | Admitting: Internal Medicine

## 2021-05-01 ENCOUNTER — Encounter (HOSPITAL_COMMUNITY): Payer: Self-pay | Admitting: *Deleted

## 2021-05-01 ENCOUNTER — Encounter: Payer: Self-pay | Admitting: Internal Medicine

## 2021-05-01 ENCOUNTER — Emergency Department (HOSPITAL_COMMUNITY)
Admission: EM | Admit: 2021-05-01 | Discharge: 2021-05-01 | Disposition: A | Discharge status: Home / Self Care | Payer: Medicaid Other | Attending: Emergency Medicine | Admitting: Emergency Medicine

## 2021-05-01 ENCOUNTER — Other Ambulatory Visit: Payer: Self-pay

## 2021-05-01 DIAGNOSIS — J441 Chronic obstructive pulmonary disease with (acute) exacerbation: Secondary | ICD-10-CM | POA: Insufficient documentation

## 2021-05-01 DIAGNOSIS — L89302 Pressure ulcer of unspecified buttock, stage 2: Secondary | ICD-10-CM | POA: Diagnosis not present

## 2021-05-01 DIAGNOSIS — Z79899 Other long term (current) drug therapy: Secondary | ICD-10-CM | POA: Diagnosis not present

## 2021-05-01 DIAGNOSIS — I10 Essential (primary) hypertension: Secondary | ICD-10-CM | POA: Insufficient documentation

## 2021-05-01 DIAGNOSIS — F17210 Nicotine dependence, cigarettes, uncomplicated: Secondary | ICD-10-CM | POA: Diagnosis not present

## 2021-05-01 DIAGNOSIS — R600 Localized edema: Secondary | ICD-10-CM | POA: Insufficient documentation

## 2021-05-01 DIAGNOSIS — L89309 Pressure ulcer of unspecified buttock, unspecified stage: Secondary | ICD-10-CM

## 2021-05-01 DIAGNOSIS — R062 Wheezing: Secondary | ICD-10-CM | POA: Diagnosis not present

## 2021-05-01 DIAGNOSIS — Z7951 Long term (current) use of inhaled steroids: Secondary | ICD-10-CM | POA: Insufficient documentation

## 2021-05-01 DIAGNOSIS — M7989 Other specified soft tissue disorders: Secondary | ICD-10-CM | POA: Diagnosis present

## 2021-05-01 DIAGNOSIS — R609 Edema, unspecified: Secondary | ICD-10-CM

## 2021-05-01 MED ORDER — FUROSEMIDE 20 MG PO TABS: 20.0000 mg | ORAL_TABLET | Freq: Every day | ORAL | 0 refills | Status: DC

## 2021-05-01 MED ORDER — MUPIROCIN CALCIUM 2 % EX CREA: 1.0000 "application " | TOPICAL_CREAM | Freq: Two times a day (BID) | CUTANEOUS | 0 refills | Status: DC

## 2021-05-01 NOTE — ED Provider Notes (Signed)
[facility] Provider Note   CSN: [id number] Arrival date & time: 05/01/21  1633     History Chief Complaint  Patient presents with   Foot Swelling    Tony Sullivan is a 60 y.o. male.  HPI  This patient is a 60 year old male, the patient is fairly debilitated with regards to COPD on 3 L of oxygen at all times.  He also has a history of high blood pressure, prediabetes, is seen fairly frequently with regards to his breathing and COPD however he presents today with a complaint of swelling in his feet and breakdown of the skin around his bottom where he has a decubitus ulcer.  The patient reports that because of his debilitated nature and inability to walk much because of his shortness of breath he sits commonly or lays down in bed commonly and is developed a slight ulcer over his bottom for which he applies a topical ointment.  This is chronic, he is always concerned that it is infected however it is not tender or draining or causing fevers.  He also c/o swelling of feet that occurs when he is up for over an hour in the morning - goes away at night -chronic numbness and tingling of the bilateral lower extremities at the feet - for years according to pt.   No fevers, no increased SOB, no other sx today.  Past Medical History:  Diagnosis Date   Anxiety    Arthritis    COPD (chronic obstructive pulmonary disease) ([place]) 2004   diagnosed in 2004, no PFT's to date.  Started on home O2 12/2013, after found to be desatting at PCP's office, and referred to pulmonology.   Depression    GERD (gastroesophageal reflux disease)    High blood pressure    Prediabetes    Seasonal allergies    Tobacco dependence     Patient Active Problem List   Diagnosis Date Noted   Hyperlipidemia 05/24/2020   Hypokalemia 05/24/2020   Chest pain 05/23/2020   Bipolar disorder ([place]) 05/23/2020   HNP (herniated nucleus pulposus), cervical 09/06/2019   Abdominal pain    Poor dentition  11/04/2017   Acute on chronic respiratory failure with hypoxia ([place]) 08/29/2017   ETOH abuse 03/30/2017   Cigarette smoker 03/30/2017   Acute respiratory failure with hypoxia and hypercapnia (HCC) 03/29/2017   Special screening for malignant neoplasms, colon    Benign neoplasm of ascending colon    Benign neoplasm of descending colon    Hoarseness 08/22/2016   GERD (gastroesophageal reflux disease) 08/22/2016   Atypical chest pain 12/05/2015   Essential hypertension 06/15/2014   Pectoralis muscle strain 01/11/2014   COPD with acute exacerbation ([place]) 01/10/2014   COPD  GOLD III 01/08/2014   Smoker 01/08/2014   Cough 01/08/2014   Chronic respiratory failure with hypoxia ([place]) 01/08/2014    Past Surgical History:  Procedure Laterality Date   ANTERIOR CERVICAL DECOMP/DISCECTOMY FUSION N/A 09/06/2019   Procedure: Anterior Cervical Discecectomy Fusion - Cerivcal Five-Cervical Six;  Surgeon: Kary Kos, MD;  Location: [facility];  Service: Neurosurgery;  Laterality: N/A;  Anterior Cervical Discecectomy Fusion - Cerivcal Five-Cervical Six   ANTERIOR CERVICAL DISCECTOMY  09/06/2019   APPENDECTOMY  1980   COLONOSCOPY WITH PROPOFOL N/A 12/10/2016   Procedure: COLONOSCOPY WITH PROPOFOL;  Surgeon: [name], MD;  [facility];  Service: Endoscopy;  Laterality: N/A;   HIP SURGERY Right    SHOULDER ARTHROSCOPY Right  Family History  Problem Relation Age of Onset   Emphysema Mother    Allergies Mother        "everyone in family"   Asthma Mother        "everyone in family"   Heart disease Mother    Clotting disorder Mother    Cancer Mother    Emphysema Father    Allergies Father    Allergies Son    Seizures Brother    Diabetes Brother     Social History   Tobacco Use   Smoking status: Some Days    Packs/day: 0.00    Years: 36.00    Pack years: 0.00    Types: Cigarettes   Smokeless tobacco: Never   Tobacco comments:    smokes 2 cigarettes per day 11/06/2020   Vaping Use   Vaping Use: Never used  Substance Use Topics   Alcohol use: Not Currently    Alcohol/week: 14.0 - 21.0 standard drinks    Types: 14 - 21 Standard drinks or equivalent per week    Comment: 2-3 beers/day, with occasional "binges".  No history of withdrawal   Drug use: Not Currently    Types: Marijuana, Cocaine    Comment: last cocaine use last month    Home Medications Prior to Admission medications   Medication Sig Start Date End Date Taking? Authorizing Provider  furosemide (LASIX) 20 MG tablet Take 1 tablet (20 mg total) by mouth daily. 05/01/21  Yes [name], MD  mupirocin cream (BACTROBAN) 2 % Apply 1 application topically 2 (two) times daily. 05/01/21  Yes [name], MD  albuterol [facility] HFA) [phone] Base) MCG/ACT inhaler Inhale 2 puffs into the lungs every 4 (four) hours as needed. 03/19/21   Fredia Sorrow, MD  albuterol (PROVENTIL) (2.5 MG/3ML) 0.083% nebulizer solution Take 3 mLs (2.5 mg total) by nebulization every 4 (four) hours as needed for wheezing or shortness of breath. 04/28/18   [name], NP  atorvastatin (LIPITOR) 40 MG tablet TAKE ONE TABLET BY MOUTH DAILY Patient taking differently: Take 40 mg by mouth at bedtime. 07/06/20   [name], MD  benztropine (COGENTIN) 0.5 MG tablet Take 0.5 mg by mouth at bedtime.     [provider]  budesonide-formoterol (SYMBICORT) 160-4.5 MCG/ACT inhaler INHALE TWO PUFFS BY MOUTH INTO THE LUNGS TWICE A DAY 03/19/21   Fredia Sorrow, MD  cetirizine (ZYRTEC) 10 MG tablet Take 10 mg by mouth daily.    [provider]  diclofenac Sodium (VOLTAREN) 1 % GEL Apply 2 g topically 4 (four) times daily.    [provider]  dorzolamide (TRUSOPT) 2 % ophthalmic solution Place 1 drop into the left eye 2 (two) times daily. 12/12/20   [provider]  famotidine (PEPCID) 20 MG tablet TAKE 1 TABLET (20 MG TOTAL) BY MOUTH AT BEDTIME. 02/05/21   [name], MD  fluticasone (FLONASE)  50 MCG/ACT nasal spray Place 2 sprays into both nostrils 3 (three) times daily as needed for allergies.     [provider]  hydrochlorothiazide (MICROZIDE) 12.5 MG capsule Take 1 capsule (12.5 mg total) by mouth every morning. 01/29/21   Tolia, Sunit, DO  latanoprost (XALATAN) 0.005 % ophthalmic solution Place 1 drop into both eyes at bedtime. 05/24/19   [provider]  levofloxacin (LEVAQUIN) 500 MG tablet Take 1 tablet (500 mg total) by mouth daily. Patient not taking: Reported on 04/12/2021 04/01/21   Varney Biles, MD  LORazepam (ATIVAN) 1 MG tablet  Take 1 tablet (1 mg total) by mouth 3 (three) times daily as needed for anxiety. 02/15/21   Daleen Bo, MD  losartan (COZAAR) 100 MG tablet Take 100 mg by mouth daily.    [provider]  loxapine (LOXITANE) 10 MG capsule Take 10 mg by mouth at bedtime.     [provider]  megestrol (MEGACE) 40 MG tablet Take 40 mg by mouth daily.    [provider]  mirtazapine (REMERON) 30 MG tablet Take 30 mg by mouth at bedtime.    [provider]  Multiple Vitamin (MULTIVITAMIN) capsule Take 1 capsule by mouth daily.    [provider]  OXYGEN Inhale 3-4 L into the lungs as directed. 3-4 liters with sleep and exertion as needed for low oxygen    [provider]  pantoprazole (PROTONIX) 40 MG tablet Take 40 mg by mouth daily.    [provider]  polyethylene glycol (MIRALAX / GLYCOLAX) 17 g packet TAKE 17 GRAMS BY MOUTH DAILY 03/09/20   [name], MD  potassium chloride SA (KLOR-CON) 20 MEQ tablet Take 1 tablet (20 mEq total) by mouth 2 (two) times daily. Patient not taking: No sig reported 02/15/21   Daleen Bo, MD  predniSONE (DELTASONE) 10 MG tablet Take 2 tablets (20 mg total) by mouth daily. 04/12/21   [name], MD  risperiDONE (RISPERDAL) 2 MG tablet Take 2 mg by mouth at bedtime.    [provider]  Tiotropium Bromide Monohydrate (SPIRIVA RESPIMAT) 2.5  MCG/ACT AERS Inhale 2 puffs into the lungs daily. 03/19/21 04/18/21  Fredia Sorrow, MD    Allergies    Penicillins and Levocetirizine  Review of Systems   Review of Systems  All other systems reviewed and are negative.  Physical Exam Updated Vital Signs BP 112/75   Pulse 80   Temp 98.4 F (36.9 C) (Oral)   Resp 18   Ht 1.803 m (5\' 11" )   Wt 72.6 kg   SpO2 98%   BMI 22.32 kg/m   Physical Exam Vitals and nursing note reviewed.  Constitutional:      General: He is not in acute distress.    Appearance: He is well-developed.  HENT:     Head: Normocephalic and atraumatic.     Mouth/Throat:     Pharynx: No oropharyngeal exudate.  Eyes:     General: No scleral icterus.       Right eye: No discharge.        Left eye: No discharge.     Conjunctiva/sclera: Conjunctivae normal.     Pupils: Pupils are equal, round, and reactive to light.  Neck:     Thyroid: No thyromegaly.     Vascular: No JVD.  Cardiovascular:     Rate and Rhythm: Normal rate and regular rhythm.     Heart sounds: Normal heart sounds. No murmur heard.   No friction rub. No gallop.  Pulmonary:     Effort: Pulmonary effort is normal. No respiratory distress.     Breath sounds: Wheezing present. No rales.     Comments: Speaks in full sentences, no distress, has occasional expiratory wheezing Abdominal:     General: Bowel sounds are normal. There is no distension.     Palpations: Abdomen is soft. There is no mass.     Tenderness: There is no abdominal tenderness.  Musculoskeletal:        General: No tenderness. Normal range of motion.     Cervical back: Normal range  of motion and neck supple.     Right lower leg: Edema present.     Left lower leg: Edema present.     Comments: Edema of the ankles and feet - 1+ and symmetrical  Lymphadenopathy:     Cervical: No cervical adenopathy.  Skin:    General: Skin is warm and dry.     Findings: No erythema or rash.     Comments: Grade 1-2 decubitus ulcer around  the buttocks, no drainage, no surrounding cellulitis, no open wound  Neurological:     Mental Status: He is alert.     Coordination: Coordination normal.     Comments: Awake alert and able to straight leg raise bilaterally without any difficulty  Psychiatric:        Behavior: Behavior normal.    ED Results / Procedures / Treatments   Labs (all labs ordered are listed, but only abnormal results are displayed) Labs Reviewed - No data to display  EKG None  Radiology No results found.  Procedures Procedures   Medications Ordered in ED Medications - No data to display  ED Course  I have reviewed the triage vital signs and the nursing notes.  Pertinent labs & imaging results that were available during my care of the patient were reviewed by me and considered in my medical decision making (see chart for details).    MDM Rules/Calculators/A&P                          Of the patient's 2 complaints the decubitus ulcer can be treated with topical antibiotics and dressings, I talked to the patient by getting a ring pillow or a doughnut pillow to help keep pressure off this area and to be more active.  With regards to the foot swelling, this seems chronic, mild, will give 2 or 3 days of Lasix but otherwise this patient appears well, he has no other respiratory symptoms to suggest that he is decompensated with regards to COPD and appears stable for discharge  Final Clinical Impression(s) / ED Diagnoses Final diagnoses:  None    Rx / DC Orders ED Discharge Orders          Ordered    furosemide (LASIX) 20 MG tablet  Daily        05/01/21 1803    mupirocin cream (BACTROBAN) 2 %  2 times daily        05/01/21 1803             [name], MD 05/01/21 [phone]

## 2021-05-01 NOTE — ED Triage Notes (Signed)
Swelling of both feet onset last night

## 2021-05-01 NOTE — Discharge Instructions (Addendum)
Please apply the mupirocin twice a day to the wounds around your bottom, use a donut pillow to sit.  Please take Lasix once a day for the next 5 days to help get rid of some of the fluid, emergency department for severe or worsening symptoms

## 2021-05-05 ENCOUNTER — Observation Stay (HOSPITAL_COMMUNITY): Payer: Medicaid Other

## 2021-05-05 ENCOUNTER — Encounter (HOSPITAL_COMMUNITY): Payer: Self-pay

## 2021-05-05 ENCOUNTER — Other Ambulatory Visit (HOSPITAL_COMMUNITY): Payer: Self-pay | Admitting: *Deleted

## 2021-05-05 ENCOUNTER — Other Ambulatory Visit: Payer: Self-pay

## 2021-05-05 ENCOUNTER — Observation Stay (HOSPITAL_COMMUNITY)
Admission: EM | Admit: 2021-05-05 | Discharge: 2021-05-06 | Disposition: A | Discharge status: Home / Self Care | Payer: Medicaid Other | Attending: Internal Medicine | Admitting: Internal Medicine

## 2021-05-05 ENCOUNTER — Observation Stay (HOSPITAL_BASED_OUTPATIENT_CLINIC_OR_DEPARTMENT_OTHER): Payer: Medicaid Other

## 2021-05-05 ENCOUNTER — Emergency Department (HOSPITAL_COMMUNITY): Payer: Medicaid Other

## 2021-05-05 DIAGNOSIS — Z96611 Presence of right artificial shoulder joint: Secondary | ICD-10-CM | POA: Diagnosis not present

## 2021-05-05 DIAGNOSIS — R7303 Prediabetes: Secondary | ICD-10-CM | POA: Insufficient documentation

## 2021-05-05 DIAGNOSIS — R600 Localized edema: Secondary | ICD-10-CM | POA: Insufficient documentation

## 2021-05-05 DIAGNOSIS — R519 Headache, unspecified: Secondary | ICD-10-CM | POA: Diagnosis present

## 2021-05-05 DIAGNOSIS — E876 Hypokalemia: Secondary | ICD-10-CM | POA: Diagnosis not present

## 2021-05-05 DIAGNOSIS — R531 Weakness: Secondary | ICD-10-CM | POA: Diagnosis not present

## 2021-05-05 DIAGNOSIS — J9611 Chronic respiratory failure with hypoxia: Secondary | ICD-10-CM | POA: Diagnosis present

## 2021-05-05 DIAGNOSIS — J449 Chronic obstructive pulmonary disease, unspecified: Secondary | ICD-10-CM | POA: Diagnosis present

## 2021-05-05 DIAGNOSIS — Z20822 Contact with and (suspected) exposure to covid-19: Secondary | ICD-10-CM | POA: Diagnosis not present

## 2021-05-05 DIAGNOSIS — Y900 Blood alcohol level of less than 20 mg/100 ml: Secondary | ICD-10-CM | POA: Insufficient documentation

## 2021-05-05 DIAGNOSIS — R202 Paresthesia of skin: Secondary | ICD-10-CM

## 2021-05-05 DIAGNOSIS — F17200 Nicotine dependence, unspecified, uncomplicated: Secondary | ICD-10-CM | POA: Diagnosis present

## 2021-05-05 DIAGNOSIS — G459 Transient cerebral ischemic attack, unspecified: Secondary | ICD-10-CM | POA: Diagnosis not present

## 2021-05-05 DIAGNOSIS — I10 Essential (primary) hypertension: Secondary | ICD-10-CM | POA: Diagnosis present

## 2021-05-05 DIAGNOSIS — R200 Anesthesia of skin: Principal | ICD-10-CM | POA: Insufficient documentation

## 2021-05-05 DIAGNOSIS — D72829 Elevated white blood cell count, unspecified: Secondary | ICD-10-CM | POA: Diagnosis not present

## 2021-05-05 DIAGNOSIS — E782 Mixed hyperlipidemia: Secondary | ICD-10-CM | POA: Diagnosis present

## 2021-05-05 DIAGNOSIS — E785 Hyperlipidemia, unspecified: Secondary | ICD-10-CM | POA: Diagnosis present

## 2021-05-05 DIAGNOSIS — F17210 Nicotine dependence, cigarettes, uncomplicated: Secondary | ICD-10-CM | POA: Diagnosis not present

## 2021-05-05 DIAGNOSIS — Z79899 Other long term (current) drug therapy: Secondary | ICD-10-CM | POA: Diagnosis not present

## 2021-05-05 DIAGNOSIS — K219 Gastro-esophageal reflux disease without esophagitis: Secondary | ICD-10-CM | POA: Diagnosis present

## 2021-05-05 DIAGNOSIS — Z720 Tobacco use: Secondary | ICD-10-CM | POA: Diagnosis present

## 2021-05-05 LAB — DIFFERENTIAL
Abs Immature Granulocytes: 0.21 10*3/uL — ABNORMAL HIGH (ref 0.00–0.07)
Basophils Absolute: 0.1 10*3/uL (ref 0.0–0.1)
Basophils Relative: 1 %
Eosinophils Absolute: 0.1 10*3/uL (ref 0.0–0.5)
Eosinophils Relative: 0 %
Immature Granulocytes: 1 %
Lymphocytes Relative: 21 %
Lymphs Abs: 3.2 10*3/uL (ref 0.7–4.0)
Monocytes Absolute: 1.4 10*3/uL — ABNORMAL HIGH (ref 0.1–1.0)
Monocytes Relative: 9 %
Neutro Abs: 10 10*3/uL — ABNORMAL HIGH (ref 1.7–7.7)
Neutrophils Relative %: 68 %

## 2021-05-05 LAB — LIPID PANEL
Cholesterol: 126 mg/dL (ref 0–200)
HDL: 56 mg/dL (ref >40)
LDL Cholesterol: 58 mg/dL (ref 0–99)
Total CHOL/HDL Ratio: 2.3 RATIO
Triglycerides: 62 mg/dL (ref <150)
VLDL: 12 mg/dL (ref 0–40)

## 2021-05-05 LAB — RESP PANEL BY RT-PCR (FLU A&B, COVID) ARPGX2
Influenza A by PCR: NEGATIVE
Influenza B by PCR: NEGATIVE
SARS Coronavirus 2 by RT PCR: NEGATIVE

## 2021-05-05 LAB — I-STAT CHEM 8, ED
BUN: 17 mg/dL (ref 6–20)
Calcium, Ion: 1.17 mmol/L (ref 1.15–1.40)
Chloride: 103 mmol/L (ref 98–111)
Creatinine, Ser: 1.1 mg/dL (ref 0.61–1.24)
Glucose, Bld: 93 mg/dL (ref 70–99)
HCT: 44 % (ref 39.0–52.0)
Hemoglobin: 15 g/dL (ref 13.0–17.0)
Potassium: 3.3 mmol/L — ABNORMAL LOW (ref 3.5–5.1)
Sodium: 141 mmol/L (ref 135–145)
TCO2: 30 mmol/L (ref 22–32)

## 2021-05-05 LAB — CBC
HCT: 44.1 % (ref 39.0–52.0)
Hemoglobin: 14.1 g/dL (ref 13.0–17.0)
MCH: 32 pg (ref 26.0–34.0)
MCHC: 32 g/dL (ref 30.0–36.0)
MCV: 100 fL (ref 80.0–100.0)
Platelets: 254 10*3/uL (ref 150–400)
RBC: 4.41 MIL/uL (ref 4.22–5.81)
RDW: 15.1 % (ref 11.5–15.5)
WBC: 14.8 10*3/uL — ABNORMAL HIGH (ref 4.0–10.5)
nRBC: 0 % (ref 0.0–0.2)

## 2021-05-05 LAB — PROTIME-INR
INR: 0.9 (ref 0.8–1.2)
Prothrombin Time: 12.4 seconds (ref 11.4–15.2)

## 2021-05-05 LAB — COMPREHENSIVE METABOLIC PANEL
ALT: 27 U/L (ref 0–44)
AST: 25 U/L (ref 15–41)
Albumin: 3.7 g/dL (ref 3.5–5.0)
Alkaline Phosphatase: 60 U/L (ref 38–126)
Anion gap: 7 (ref 5–15)
BUN: 19 mg/dL (ref 6–20)
CO2: 29 mmol/L (ref 22–32)
Calcium: 8.7 mg/dL — ABNORMAL LOW (ref 8.9–10.3)
Chloride: 102 mmol/L (ref 98–111)
Creatinine, Ser: 1.01 mg/dL (ref 0.61–1.24)
GFR, Estimated: >60 mL/min (ref >60)
Glucose, Bld: 95 mg/dL (ref 70–99)
Potassium: 3.2 mmol/L — ABNORMAL LOW (ref 3.5–5.1)
Sodium: 138 mmol/L (ref 135–145)
Total Bilirubin: 1.1 mg/dL (ref 0.3–1.2)
Total Protein: 6.5 g/dL (ref 6.5–8.1)

## 2021-05-05 LAB — ECHOCARDIOGRAM COMPLETE
Area-P 1/2: 2.75 cm2
Height: 71 in
S' Lateral: 2.2 cm
Weight: 2560.86 oz

## 2021-05-05 LAB — APTT: aPTT: 27 seconds (ref 24–36)

## 2021-05-05 LAB — HEMOGLOBIN A1C
Hgb A1c MFr Bld: 6.1 % — ABNORMAL HIGH (ref 4.8–5.6)
Mean Plasma Glucose: 128.37 mg/dL

## 2021-05-05 LAB — ETHANOL: Alcohol, Ethyl (B): <10 mg/dL (ref <10)

## 2021-05-05 IMAGING — MR MR MRA NECK WO/W CM
7 of 8 series · 39 of 48 positions shown · IV contrast (Gadavist)
Comparison: Prior CT from [DATE].

CLINICAL DATA: Initial evaluation for neuro deficit, stroke
suspected.

EXAM:
MRI HEAD WITHOUT CONTRAST
MRA HEAD WITHOUT CONTRAST
MRA NECK WITHOUT AND WITH CONTRAST
TECHNIQUE: Multiplanar, multi-echo pulse sequences of the brain and surrounding
structures were acquired without intravenous contrast. Angiographic
images of the Circle of Willis were acquired using MRA technique
without intravenous contrast. Angiographic images of the neck were
acquired using MRA technique without and with intravenous contrast.
Carotid stenosis measurements (when applicable) are obtained
utilizing NASCET criteria, using the distal internal carotid
diameter as the denominator.
CONTRAST:  7.5mL GADAVIST GADOBUTROL 1 MMOL/ML IV SOLN

[Series 20: tof_fl3d_tra_iso · axial · 0.6mm · 0.52mm/px · z∈[-122,-39]mm · 8 of 146 slices shown]
[im 1/146]
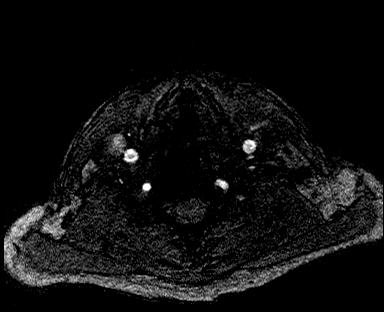
[im 21/146]
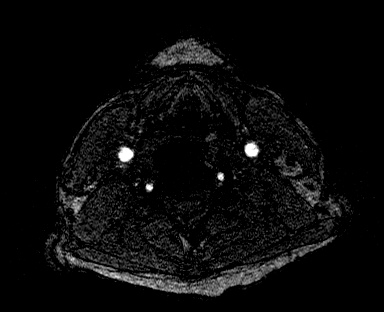
[im 42/146]
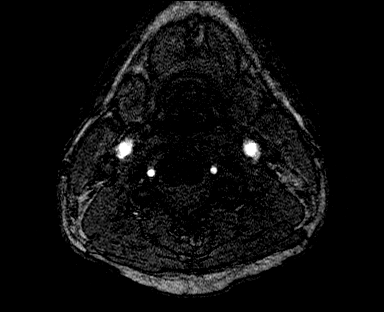
[im 63/146]
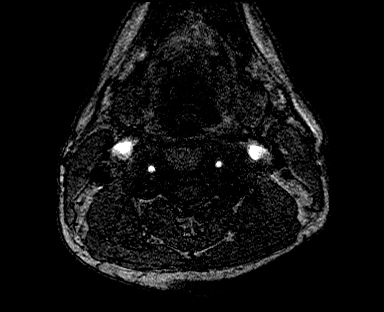
[im 83/146]
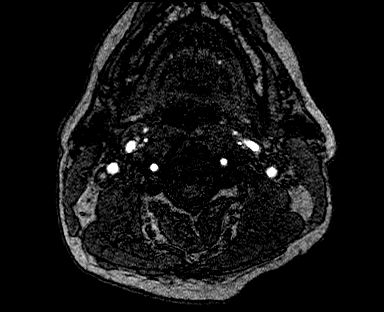
[im 104/146]
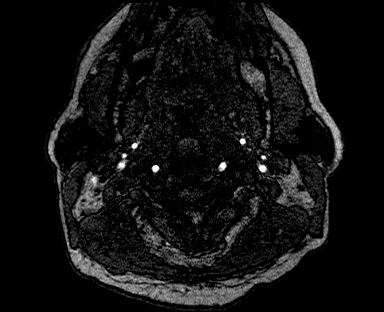
[im 125/146]
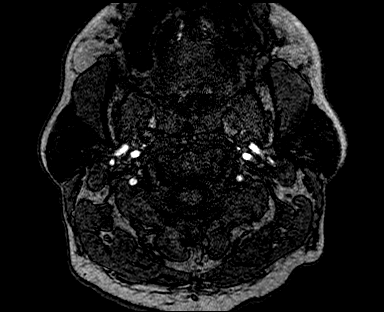
[im 146/146]
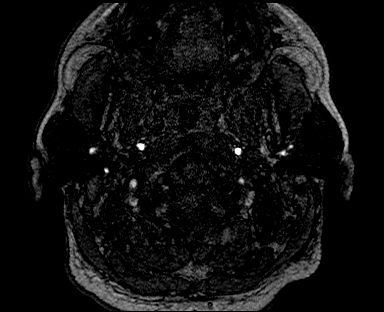

[Series 27: angio_fl3d_cor_pre_ttc=3.0s · coronal · 0.9mm · 0.85mm/px · 5 of 88 slices shown]
[im 1/88]
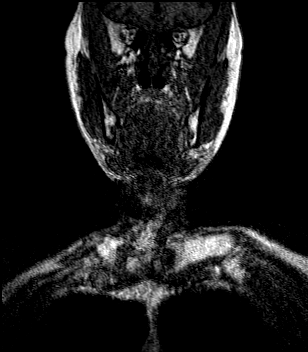
[im 22/88]
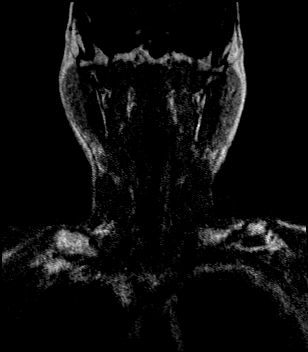
[im 44/88]
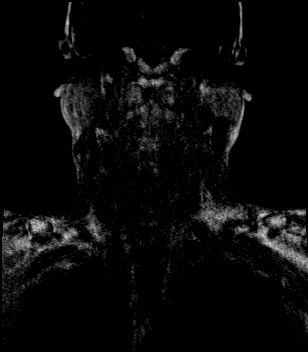
[im 66/88]
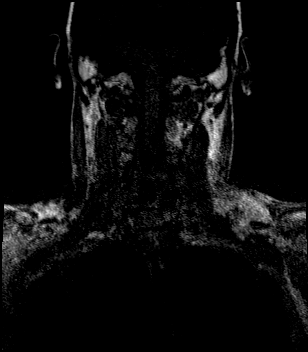
[im 88/88]
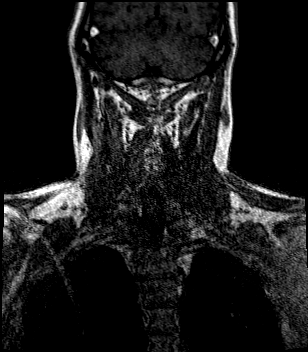

[Series 29: angio_fl3d_cor_post_ttc=3.0s · coronal · 0.9mm · 0.85mm/px · 5 of 88 slices shown (1 of 2)]
[im 1/88]
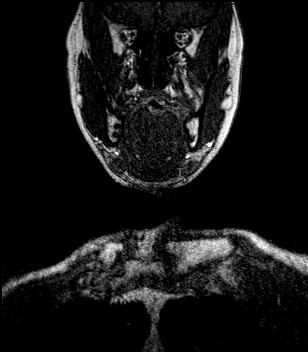
[im 22/88]
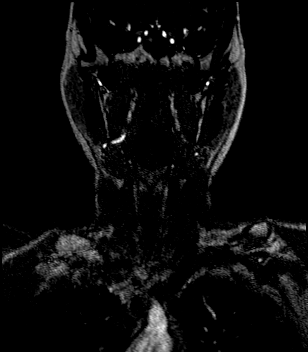
[im 44/88]
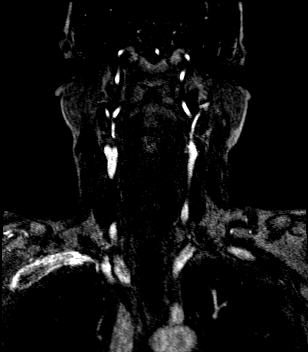
[im 66/88]
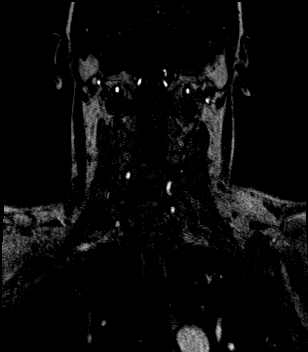
[im 88/88]
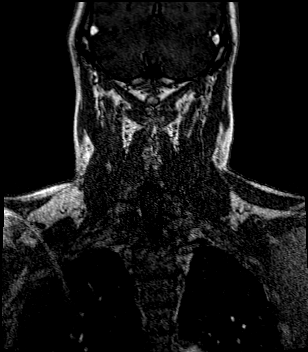

[Series 30: angio_fl3d_cor_post_ttc=3.0s_moco-adv · coronal · 0.9mm · 0.85mm/px · 6 of 88 slices shown (1 of 2)]
[im 1/88]
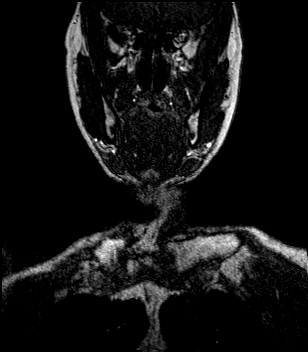
[im 18/88]
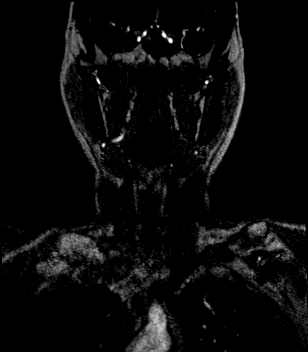
[im 35/88]
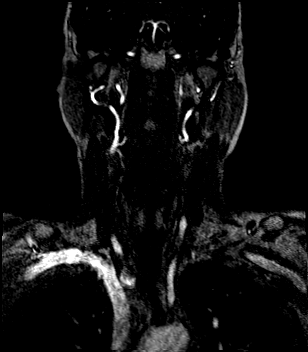
[im 53/88]
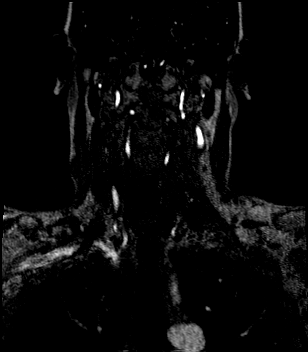
[im 70/88]
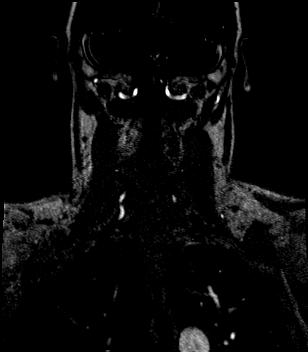
[im 88/88]
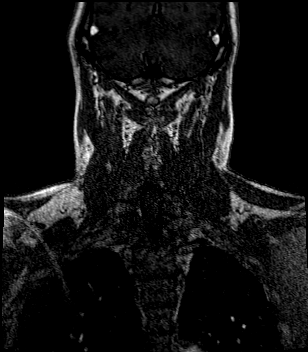

[Series 31: angio_fl3d_cor_post_ttc=3.0s_moco-adv_sub · coronal · 0.9mm · 0.85mm/px · 6 of 88 slices shown]
[im 1/88]
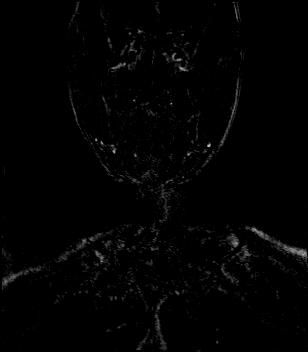
[im 18/88]
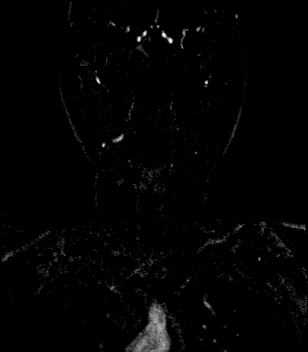
[im 35/88]
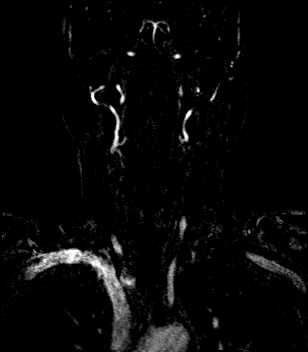
[im 53/88]
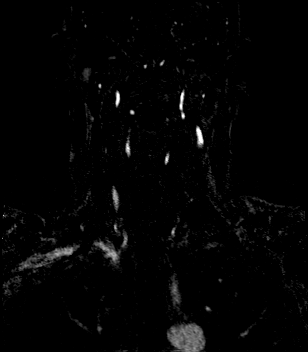
[im 70/88]
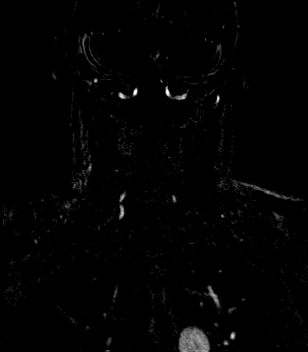
[im 88/88]
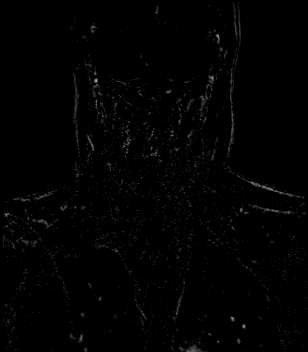

[Series 33: angio_fl3d_cor_post_ttc=3.0s · coronal · 0.9mm · 0.85mm/px · 6 of 88 slices shown (2 of 2)]
[im 1/88]
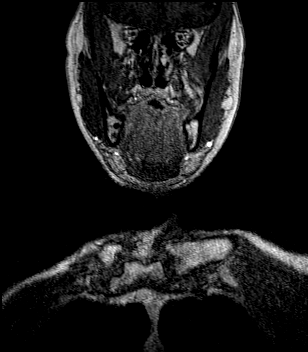
[im 18/88]
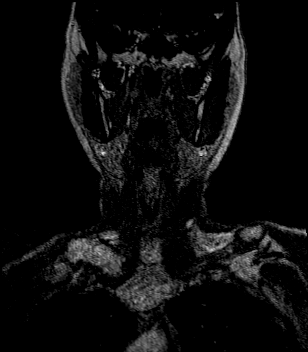
[im 35/88]
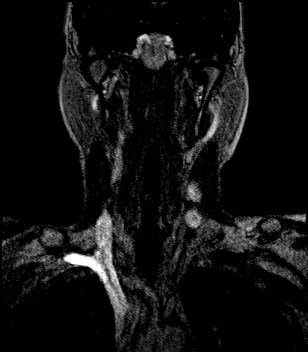
[im 53/88]
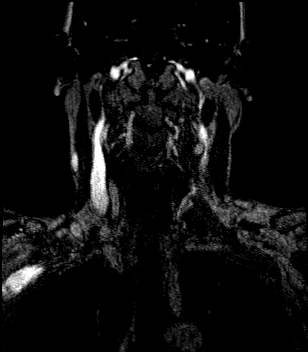
[im 70/88]
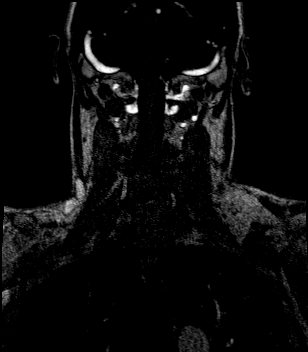
[im 88/88]
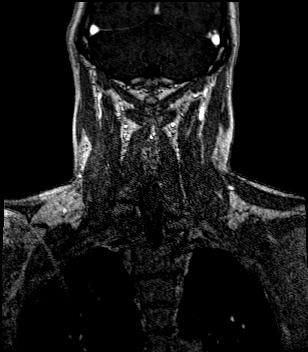

[Series 34: angio_fl3d_cor_post_ttc=3.0s_moco-adv · coronal · 0.9mm · 0.85mm/px · 3 of 88 slices shown (2 of 2)]
[im 1/88]
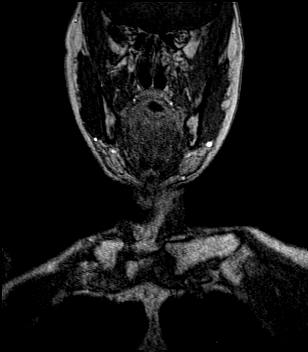
[im 18/88]
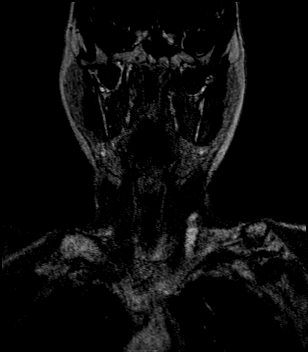
[im 35/88]
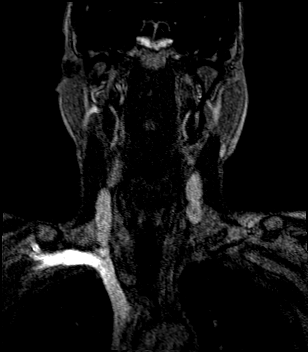

[39 of 48 positions shown; findings below may reference images not displayed]

FINDINGS: MRI HEAD FINDINGS

Brain: Cerebral volume within normal limits for age. Patchy T2/FLAIR
hyperintensity seen involving the periventricular and deep white
matter both cerebral hemispheres, most like related chronic
microvascular ischemic disease, mild in nature.

No abnormal foci of restricted diffusion to suggest acute or
subacute ischemia. Gray-white matter differentiation maintained. No
encephalomalacia to suggest chronic cortical infarction. No evidence
for acute or chronic intracranial hemorrhage.

No mass lesion, midline shift, or mass effect. No hydrocephalus or
extra-axial fluid collection. Pituitary gland and suprasellar region
within normal limits. Midline structures intact and normal.

Vascular: Major intracranial vascular flow voids are maintained.

Skull and upper cervical spine: Craniocervical junction within
normal limits. Bone marrow signal intensity normal. No scalp soft
tissue abnormality.

Sinuses/Orbits: Globes and orbital soft tissues demonstrate no acute
finding. Sequelae of prior bilateral ocular lens replacement. Mild
scattered mucosal thickening noted within the ethmoidal air cells
and maxillary sinuses. No significant mastoid effusion. Inner ear
structures grossly normal.

Other: None.

MRA HEAD FINDINGS

Anterior circulation: Distal cervical segments of the internal
carotid arteries are patent with antegrade flow. Petrous, cavernous,
and supraclinoid segments widely patent without stenosis. Subtle
outpouching at the supraclinoid left ICA favored to reflect a
vascular infundibulum related to a hypoplastic left posterior
communicating artery. A1 segments patent bilaterally. Normal
anterior communicating artery complex. Anterior cerebral arteries
grossly patent to their distal aspects without definite stenosis. No
M1 stenosis or occlusion. Normal MCA bifurcations. Distal MCA
branches well perfused and symmetric. Diffuse small vessel
atheromatous irregularity noted.

Posterior circulation: Both V4 segments patent to the
vertebrobasilar junction without stenosis. Right vertebral artery
slightly dominant. Both PICA origins patent. Basilar patent to its
distal aspect without stenosis. Superior cerebellar arteries patent
bilaterally. Both PCAs primarily supplied via the basilar well
perfused or distal aspects.

Anatomic variants: None significant.  No aneurysm.

MRA NECK FINDINGS

Aortic arch: Visualized aortic arch of normal caliber with normal
branch pattern. No stenosis or other abnormality about the origin of
the great vessels.

Right carotid system: Right common and internal carotid arteries
widely patent without stenosis, evidence for dissection or
occlusion.

Left carotid system: Left common and internal carotid arteries
widely patent without stenosis, evidence for dissection or
occlusion.

Vertebral arteries: Both vertebral arteries arise from the
subclavian arteries. No proximal subclavian artery stenosis. Both
vertebral arteries widely patent without stenosis, evidence for
dissection or occlusion.

Other: None
IMPRESSION: MRI HEAD IMPRESSION:

1. No acute intracranial abnormality.
2. Mild chronic microvascular ischemic disease for age. Otherwise
unremarkable brain MRI.

MRA HEAD IMPRESSION:

1. Negative intracranial MRA with no large vessel occlusion
identified. No hemodynamically significant or correctable stenosis.
2. Mild distal small vessel atheromatous irregularity.

MRA NECK IMPRESSION:

Normal MRA of the neck.

## 2021-05-05 IMAGING — MR MR MRA HEAD W/O CM
1 series · 20 of 48 positions shown · IV contrast (gadavist)
Comparison: Prior CT from [DATE].

CLINICAL DATA: Initial evaluation for neuro deficit, stroke
suspected.

EXAM:
MRI HEAD WITHOUT CONTRAST
MRA HEAD WITHOUT CONTRAST
MRA NECK WITHOUT AND WITH CONTRAST
TECHNIQUE: Multiplanar, multi-echo pulse sequences of the brain and surrounding
structures were acquired without intravenous contrast. Angiographic
images of the Circle of Willis were acquired using MRA technique
without intravenous contrast. Angiographic images of the neck were
acquired using MRA technique without and with intravenous contrast.
Carotid stenosis measurements (when applicable) are obtained
utilizing NASCET criteria, using the distal internal carotid
diameter as the denominator.
CONTRAST:  7.5mL GADAVIST GADOBUTROL 1 MMOL/ML IV SOLN

[Series 100: 3d cow · axial · 0.5mm · 0.41mm/px · z∈[-28,+52]mm · 20 of 172 slices shown]
[im 1/172]
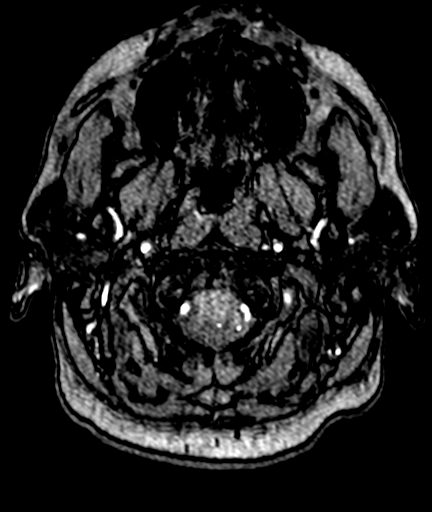
[im 4/172]
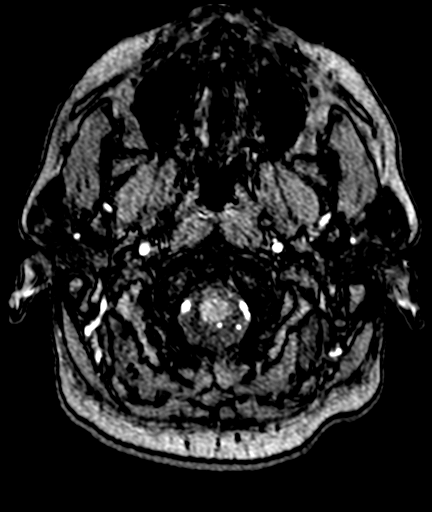
[im 8/172]
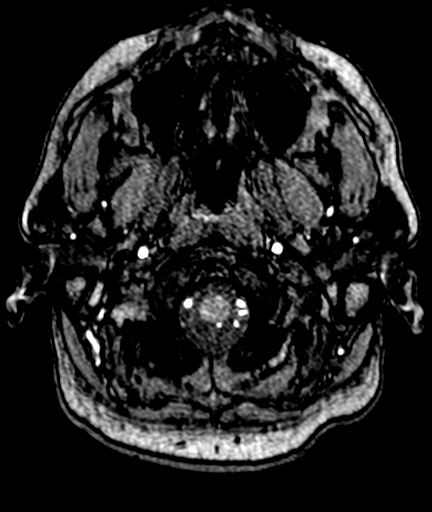
[im 11/172]
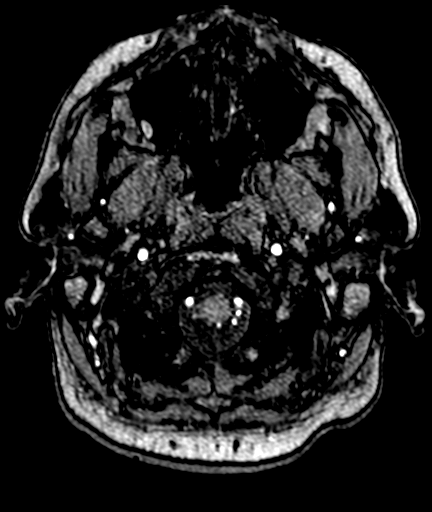
[im 15/172]
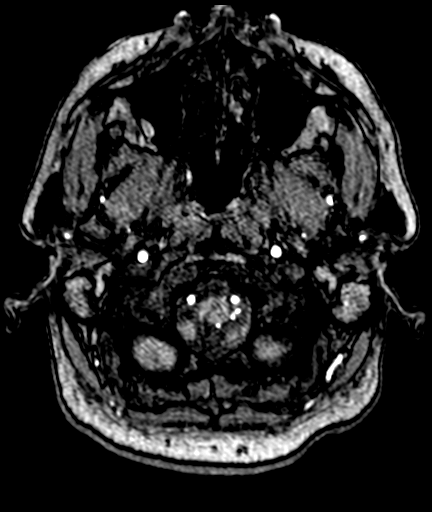
[im 19/172]
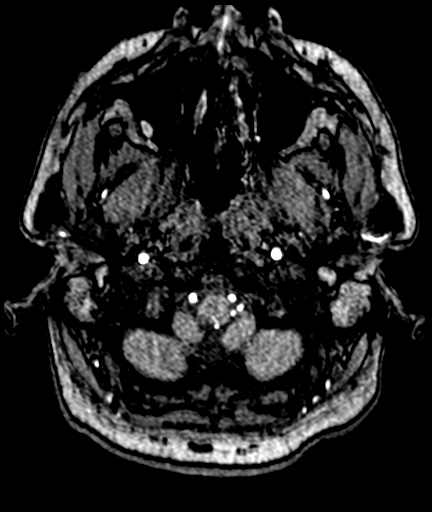
[im 22/172]
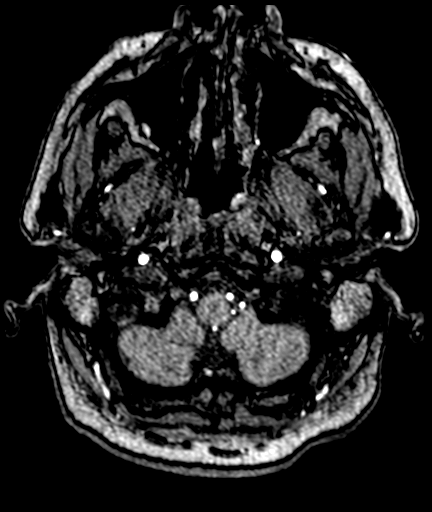
[im 26/172]
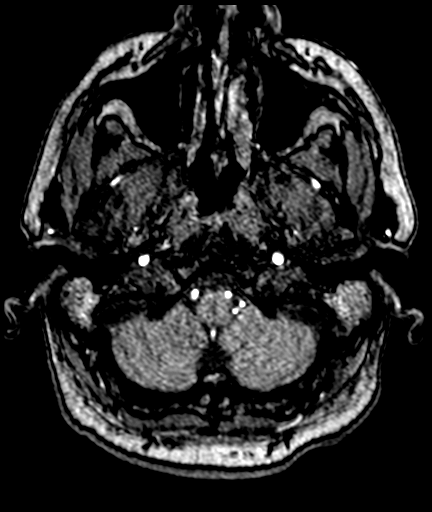
[im 30/172]
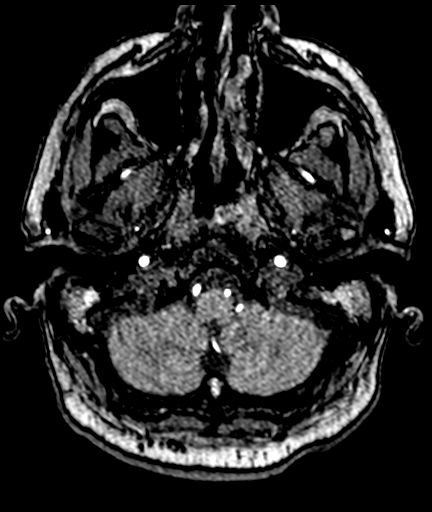
[im 33/172]
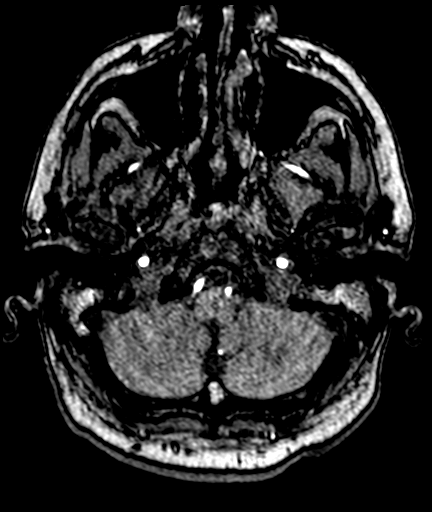
[im 37/172]
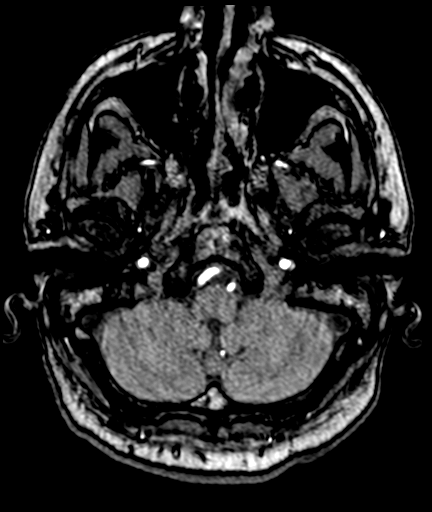
[im 41/172]
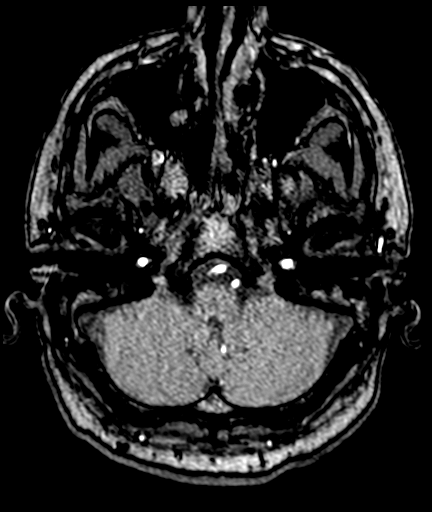
[im 55/172]
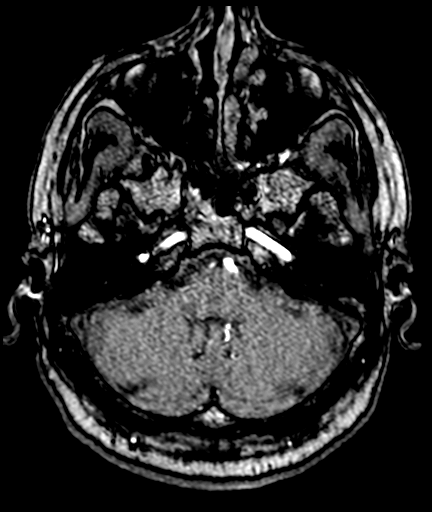
[im 77/172]
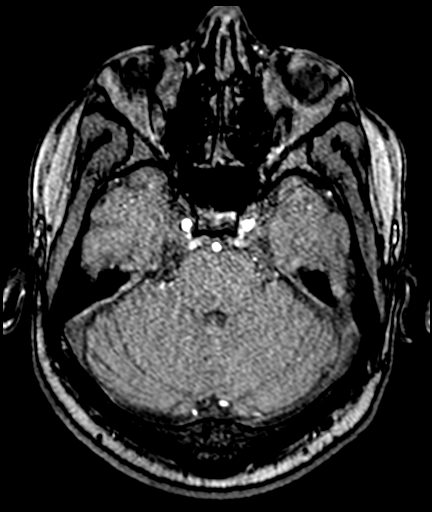
[im 88/172]
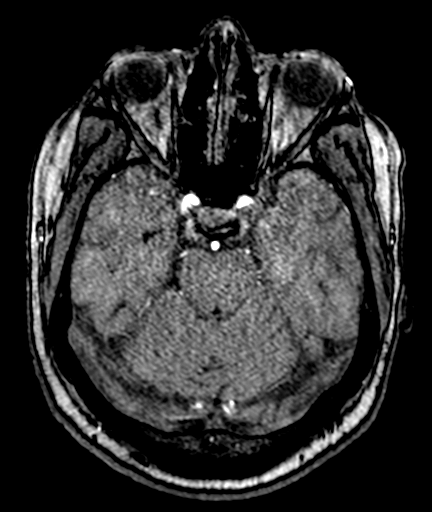
[im 99/172]
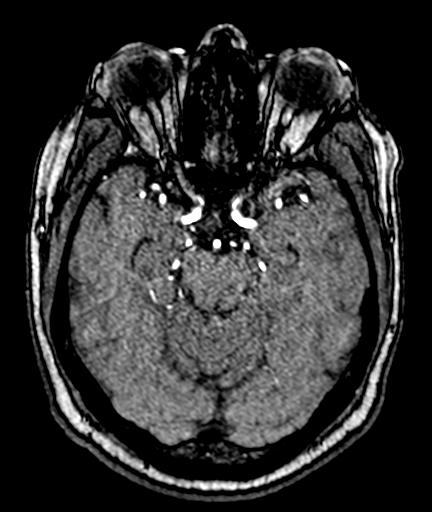
[im 121/172]
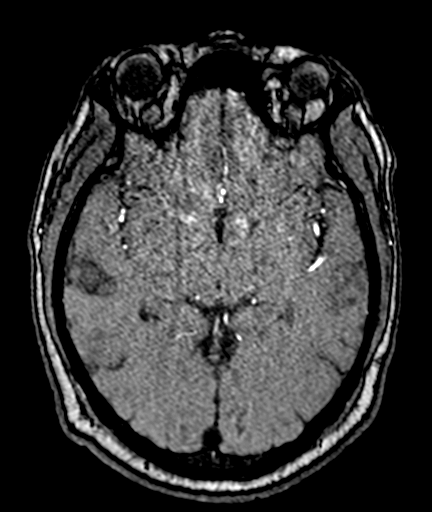
[im 142/172]
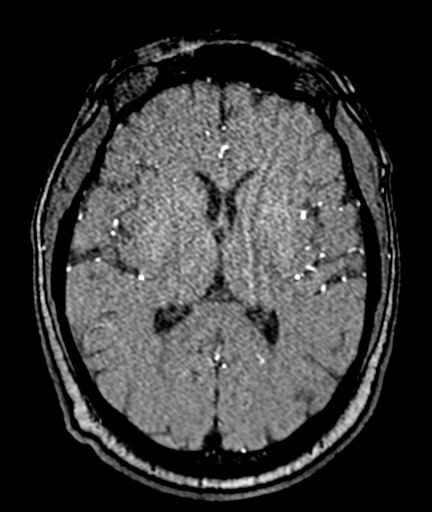
[im 146/172]
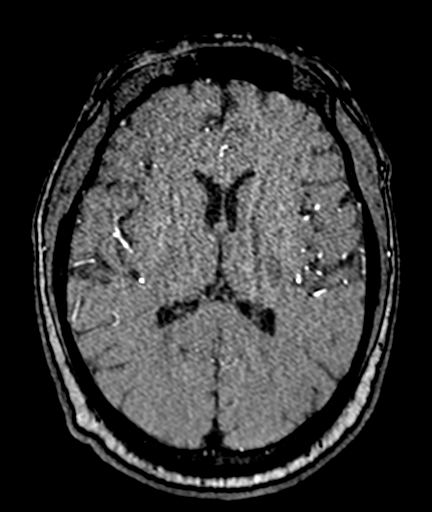
[im 164/172]
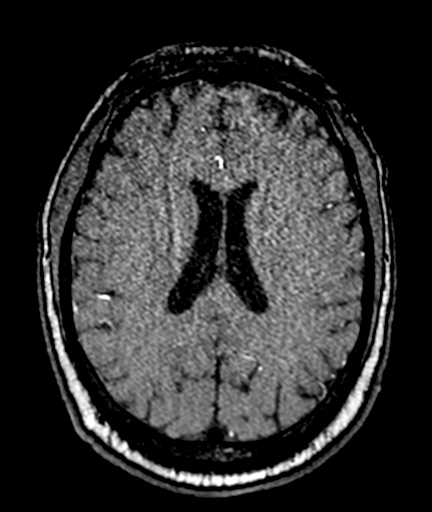

[20 of 48 positions shown; findings below may reference images not displayed]

FINDINGS: MRI HEAD FINDINGS

Brain: Cerebral volume within normal limits for age. Patchy T2/FLAIR
hyperintensity seen involving the periventricular and deep white
matter both cerebral hemispheres, most like related chronic
microvascular ischemic disease, mild in nature.

No abnormal foci of restricted diffusion to suggest acute or
subacute ischemia. Gray-white matter differentiation maintained. No
encephalomalacia to suggest chronic cortical infarction. No evidence
for acute or chronic intracranial hemorrhage.

No mass lesion, midline shift, or mass effect. No hydrocephalus or
extra-axial fluid collection. Pituitary gland and suprasellar region
within normal limits. Midline structures intact and normal.

Vascular: Major intracranial vascular flow voids are maintained.

Skull and upper cervical spine: Craniocervical junction within
normal limits. Bone marrow signal intensity normal. No scalp soft
tissue abnormality.

Sinuses/Orbits: Globes and orbital soft tissues demonstrate no acute
finding. Sequelae of prior bilateral ocular lens replacement. Mild
scattered mucosal thickening noted within the ethmoidal air cells
and maxillary sinuses. No significant mastoid effusion. Inner ear
structures grossly normal.

Other: None.

MRA HEAD FINDINGS

Anterior circulation: Distal cervical segments of the internal
carotid arteries are patent with antegrade flow. Petrous, cavernous,
and supraclinoid segments widely patent without stenosis. Subtle
outpouching at the supraclinoid left ICA favored to reflect a
vascular infundibulum related to a hypoplastic left posterior
communicating artery. A1 segments patent bilaterally. Normal
anterior communicating artery complex. Anterior cerebral arteries
grossly patent to their distal aspects without definite stenosis. No
M1 stenosis or occlusion. Normal MCA bifurcations. Distal MCA
branches well perfused and symmetric. Diffuse small vessel
atheromatous irregularity noted.

Posterior circulation: Both V4 segments patent to the
vertebrobasilar junction without stenosis. Right vertebral artery
slightly dominant. Both PICA origins patent. Basilar patent to its
distal aspect without stenosis. Superior cerebellar arteries patent
bilaterally. Both PCAs primarily supplied via the basilar well
perfused or distal aspects.

Anatomic variants: None significant.  No aneurysm.

MRA NECK FINDINGS

Aortic arch: Visualized aortic arch of normal caliber with normal
branch pattern. No stenosis or other abnormality about the origin of
the great vessels.

Right carotid system: Right common and internal carotid arteries
widely patent without stenosis, evidence for dissection or
occlusion.

Left carotid system: Left common and internal carotid arteries
widely patent without stenosis, evidence for dissection or
occlusion.

Vertebral arteries: Both vertebral arteries arise from the
subclavian arteries. No proximal subclavian artery stenosis. Both
vertebral arteries widely patent without stenosis, evidence for
dissection or occlusion.

Other: None
IMPRESSION: MRI HEAD IMPRESSION:

1. No acute intracranial abnormality.
2. Mild chronic microvascular ischemic disease for age. Otherwise
unremarkable brain MRI.

MRA HEAD IMPRESSION:

1. Negative intracranial MRA with no large vessel occlusion
identified. No hemodynamically significant or correctable stenosis.
2. Mild distal small vessel atheromatous irregularity.

MRA NECK IMPRESSION:

Normal MRA of the neck.

## 2021-05-05 IMAGING — MR MR HEAD W/O CM
12 of 13 series · 44 of 48 positions shown · IV contrast (gadavist)
Comparison: Prior CT from [DATE].

CLINICAL DATA: Initial evaluation for neuro deficit, stroke
suspected.

EXAM:
MRI HEAD WITHOUT CONTRAST
MRA HEAD WITHOUT CONTRAST
MRA NECK WITHOUT AND WITH CONTRAST
TECHNIQUE: Multiplanar, multi-echo pulse sequences of the brain and surrounding
structures were acquired without intravenous contrast. Angiographic
images of the Circle of Willis were acquired using MRA technique
without intravenous contrast. Angiographic images of the neck were
acquired using MRA technique without and with intravenous contrast.
Carotid stenosis measurements (when applicable) are obtained
utilizing NASCET criteria, using the distal internal carotid
diameter as the denominator.
CONTRAST:  7.5mL GADAVIST GADOBUTROL 1 MMOL/ML IV SOLN

[Series 11: DWI · axial · 3.0mm · 0.88mm/px · z∈[-34,+104]mm · 8 of 96 slices shown (1 of 4)]
[im 1/96]
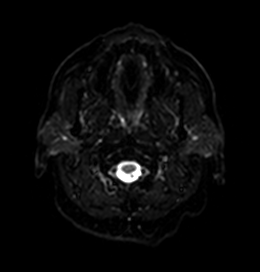
[im 14/96]
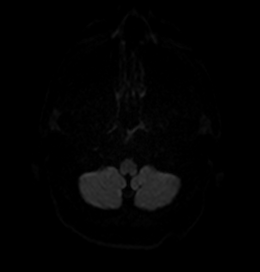
[im 28/96]
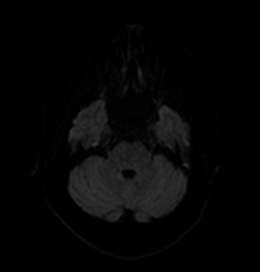
[im 41/96]
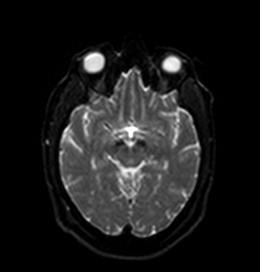
[im 55/96]
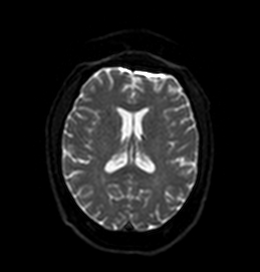
[im 68/96]
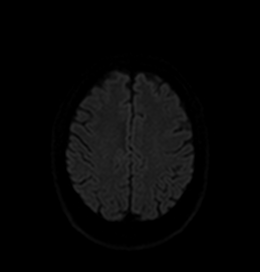
[im 82/96]
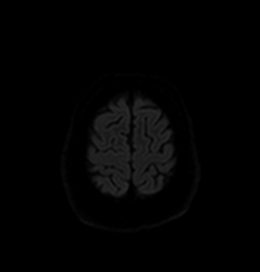
[im 96/96]
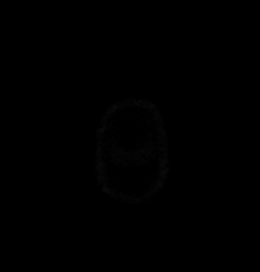

[Series 12: DWI · axial · 3.0mm · 0.88mm/px · z∈[-34,+104]mm · 4 of 48 slices shown (2 of 4)]
[im 1/48]
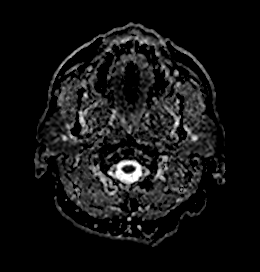
[im 16/48]
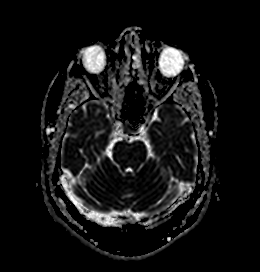
[im 32/48]
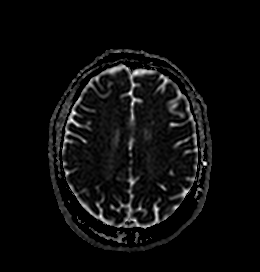
[im 48/48]
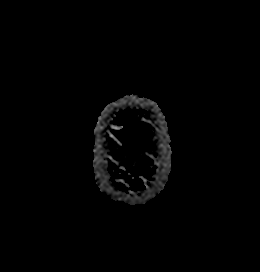

[Series 13: DWI · coronal · 4.0mm · 0.88mm/px · 5 of 72 slices shown (3 of 4)]
[im 1/72]
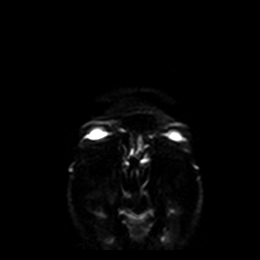
[im 18/72]
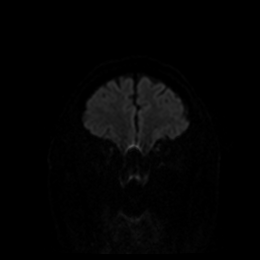
[im 36/72]
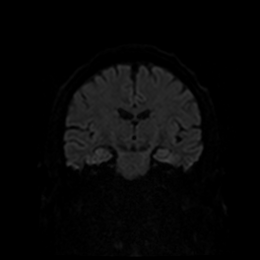
[im 54/72]
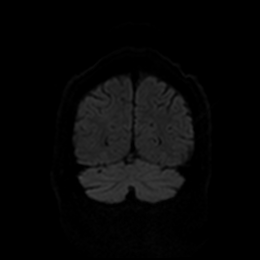
[im 72/72]
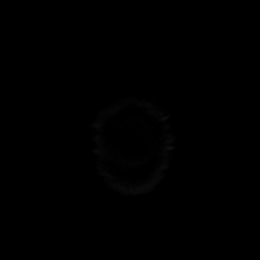

[Series 14: DWI · coronal · 4.0mm · 0.88mm/px · 3 of 36 slices shown (4 of 4)]
[im 1/36]
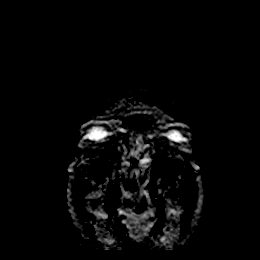
[im 18/36]
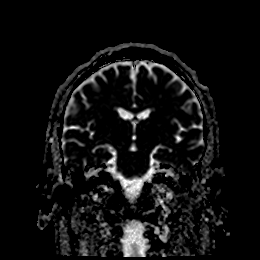
[im 36/36]
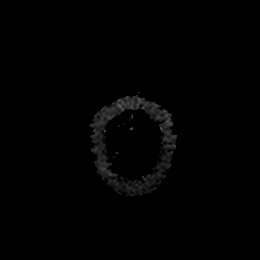

[Series 19: FLAIR · axial · 5.0mm · 0.45mm/px · z∈[-30,+112]mm · 2 of 25 slices shown]
[im 1/25]
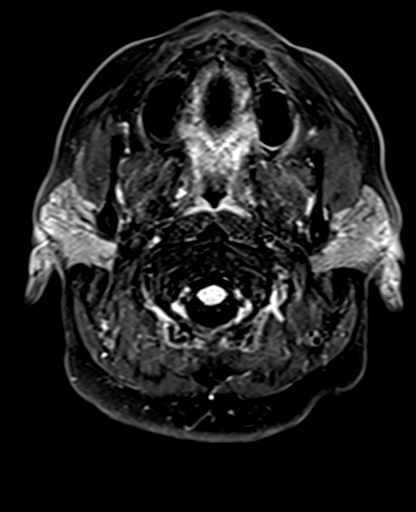
[im 25/25]
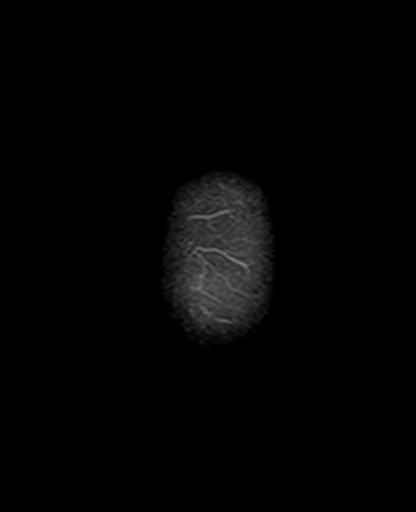

[Series 20: mag_images · axial · 3.0mm · 0.90mm/px · z∈[-46,+128]mm · 4 of 60 slices shown]
[im 1/60]
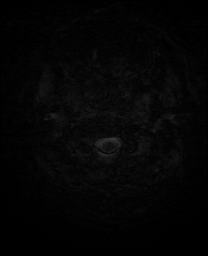
[im 20/60]
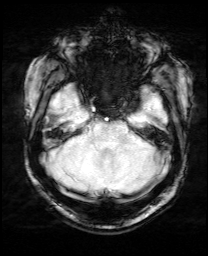
[im 40/60]
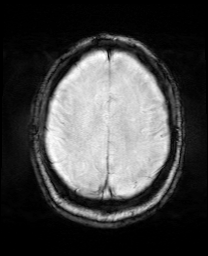
[im 60/60]
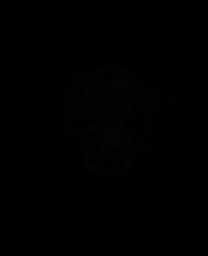

[Series 21: pha_images · axial · 3.0mm · 0.90mm/px · z∈[-46,+125]mm · 4 of 58 slices shown]
[im 1/58]
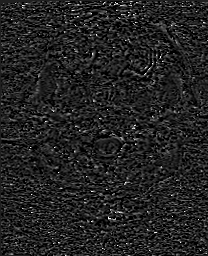
[im 20/58]
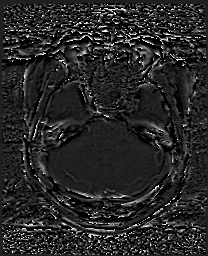
[im 39/58]
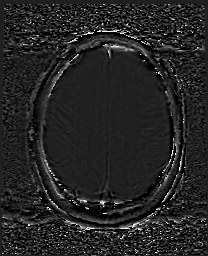
[im 58/58]
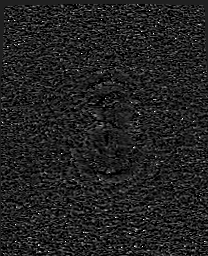

[Series 22: swi_images · axial · 3.0mm · 0.90mm/px · z∈[-46,+128]mm · 4 of 60 slices shown]
[im 1/60]
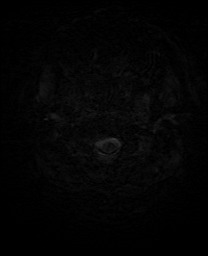
[im 20/60]
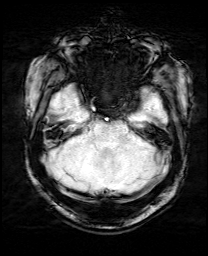
[im 40/60]
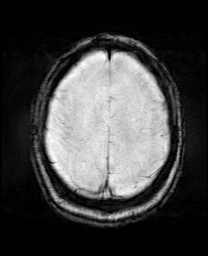
[im 60/60]
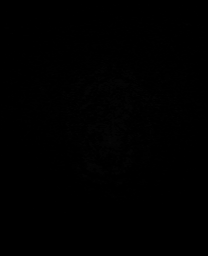

[Series 23: mip_images(sw) · axial · 24.0mm · 0.90mm/px · z∈[-36,+117]mm · 4 of 53 slices shown]
[im 1/53]
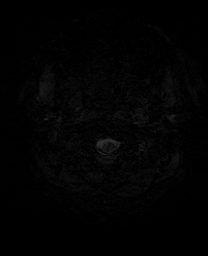
[im 18/53]
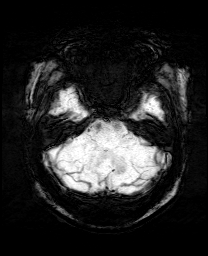
[im 35/53]
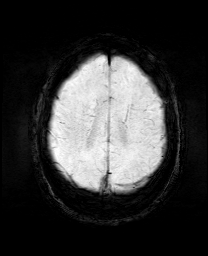
[im 53/53]
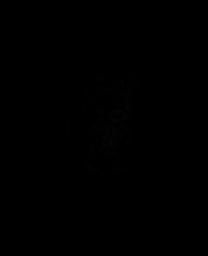

[Series 24: T1 · sagittal · 5.0mm · 0.94mm/px · 2 of 25 slices shown]
[im 1/25]
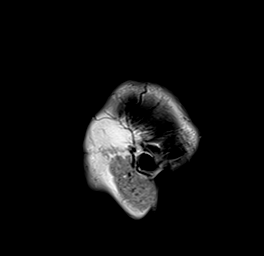
[im 25/25]
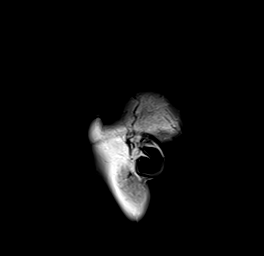

[Series 25: T2 · axial · 5.0mm · 0.72mm/px · z∈[-30,+112]mm · 2 of 25 slices shown (1 of 2)]
[im 1/25]
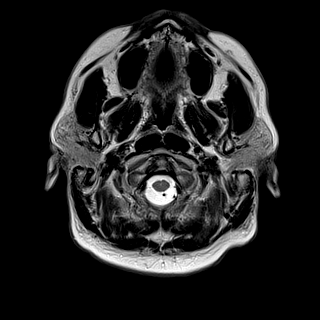
[im 25/25]
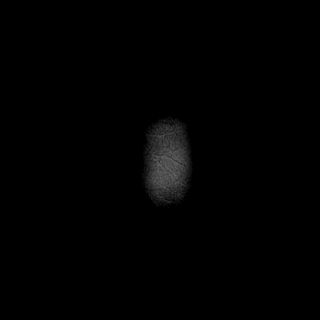

[Series 27: T2 · coronal · 5.0mm · 0.34mm/px · 2 of 29 slices shown (2 of 2)]
[im 1/29]
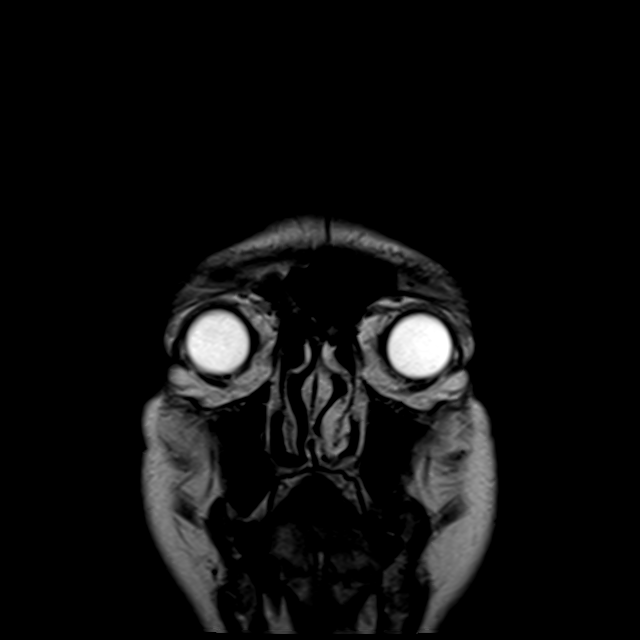
[im 29/29]
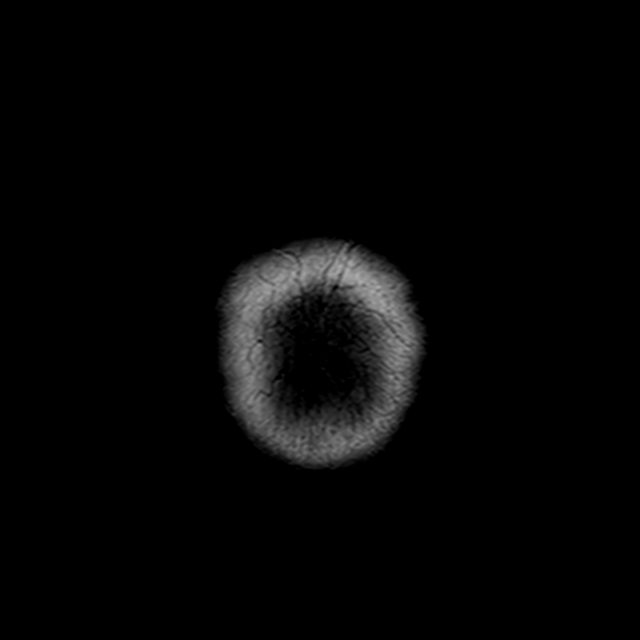

[44 of 48 positions shown; findings below may reference images not displayed]

FINDINGS: MRI HEAD FINDINGS

Brain: Cerebral volume within normal limits for age. Patchy T2/FLAIR
hyperintensity seen involving the periventricular and deep white
matter both cerebral hemispheres, most like related chronic
microvascular ischemic disease, mild in nature.

No abnormal foci of restricted diffusion to suggest acute or
subacute ischemia. Gray-white matter differentiation maintained. No
encephalomalacia to suggest chronic cortical infarction. No evidence
for acute or chronic intracranial hemorrhage.

No mass lesion, midline shift, or mass effect. No hydrocephalus or
extra-axial fluid collection. Pituitary gland and suprasellar region
within normal limits. Midline structures intact and normal.

Vascular: Major intracranial vascular flow voids are maintained.

Skull and upper cervical spine: Craniocervical junction within
normal limits. Bone marrow signal intensity normal. No scalp soft
tissue abnormality.

Sinuses/Orbits: Globes and orbital soft tissues demonstrate no acute
finding. Sequelae of prior bilateral ocular lens replacement. Mild
scattered mucosal thickening noted within the ethmoidal air cells
and maxillary sinuses. No significant mastoid effusion. Inner ear
structures grossly normal.

Other: None.

MRA HEAD FINDINGS

Anterior circulation: Distal cervical segments of the internal
carotid arteries are patent with antegrade flow. Petrous, cavernous,
and supraclinoid segments widely patent without stenosis. Subtle
outpouching at the supraclinoid left ICA favored to reflect a
vascular infundibulum related to a hypoplastic left posterior
communicating artery. A1 segments patent bilaterally. Normal
anterior communicating artery complex. Anterior cerebral arteries
grossly patent to their distal aspects without definite stenosis. No
M1 stenosis or occlusion. Normal MCA bifurcations. Distal MCA
branches well perfused and symmetric. Diffuse small vessel
atheromatous irregularity noted.

Posterior circulation: Both V4 segments patent to the
vertebrobasilar junction without stenosis. Right vertebral artery
slightly dominant. Both PICA origins patent. Basilar patent to its
distal aspect without stenosis. Superior cerebellar arteries patent
bilaterally. Both PCAs primarily supplied via the basilar well
perfused or distal aspects.

Anatomic variants: None significant.  No aneurysm.

MRA NECK FINDINGS

Aortic arch: Visualized aortic arch of normal caliber with normal
branch pattern. No stenosis or other abnormality about the origin of
the great vessels.

Right carotid system: Right common and internal carotid arteries
widely patent without stenosis, evidence for dissection or
occlusion.

Left carotid system: Left common and internal carotid arteries
widely patent without stenosis, evidence for dissection or
occlusion.

Vertebral arteries: Both vertebral arteries arise from the
subclavian arteries. No proximal subclavian artery stenosis. Both
vertebral arteries widely patent without stenosis, evidence for
dissection or occlusion.

Other: None
IMPRESSION: MRI HEAD IMPRESSION:

1. No acute intracranial abnormality.
2. Mild chronic microvascular ischemic disease for age. Otherwise
unremarkable brain MRI.

MRA HEAD IMPRESSION:

1. Negative intracranial MRA with no large vessel occlusion
identified. No hemodynamically significant or correctable stenosis.
2. Mild distal small vessel atheromatous irregularity.

MRA NECK IMPRESSION:

Normal MRA of the neck.

## 2021-05-05 IMAGING — CT CT HEAD W/O CM
4 series · 17 of 47 positions shown, 19 images · non-contrast
Comparison: [DATE]

CLINICAL DATA: Headache and left shoulder pain. Pins and needles
sensation in the left hand. Acute stroke suspected.

EXAM:
CT HEAD WITHOUT CONTRAST
TECHNIQUE: Contiguous axial images were obtained from the base of the skull
through the vertex without intravenous contrast.

[Series 2: head w o · axial · 0.41mm/px · z∈[-601,-486]mm · 7 of 31 slices shown, 9 images]
[im 4/31  brain]
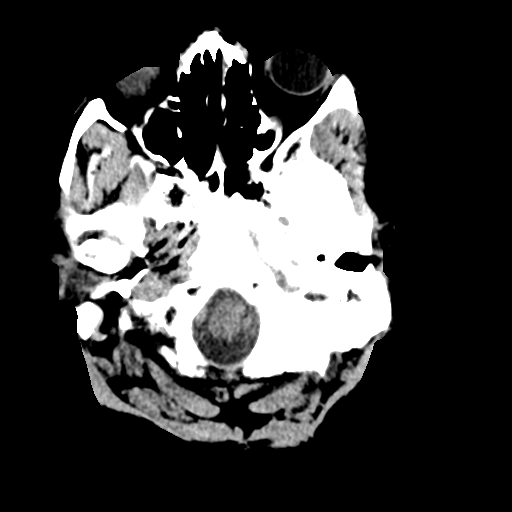
[im 4/31  bone]
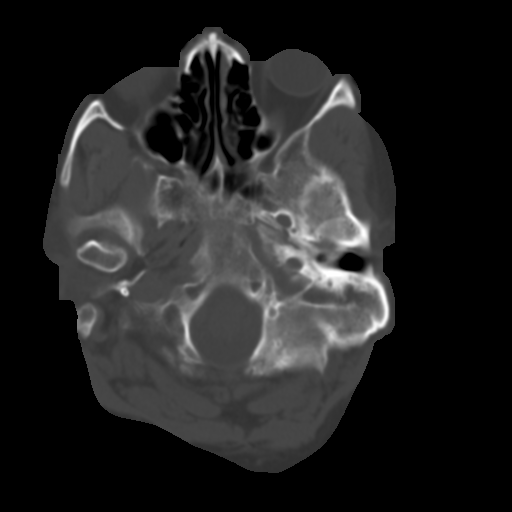
[im 8/31  brain]
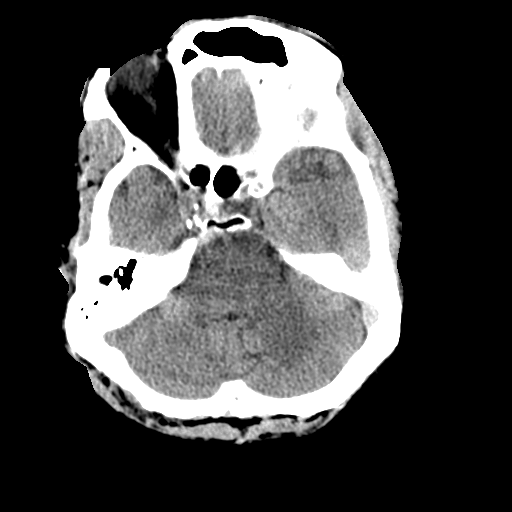
[im 12/31  brain]
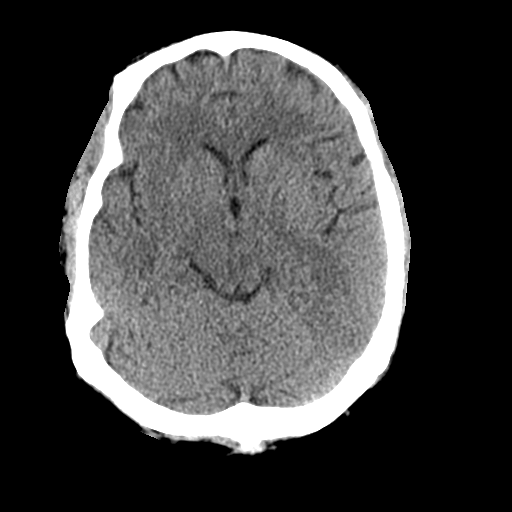
[im 16/31  brain]
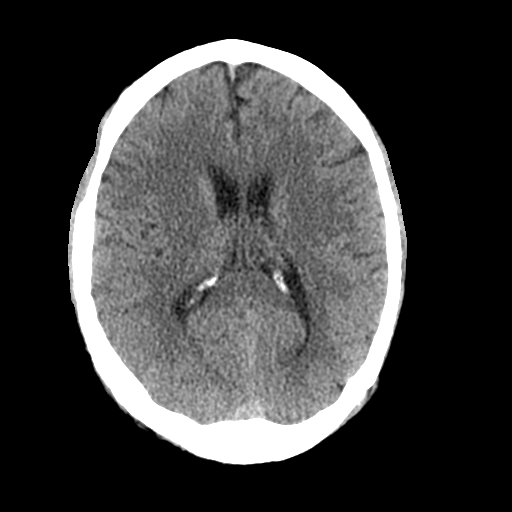
[im 19/31  brain]
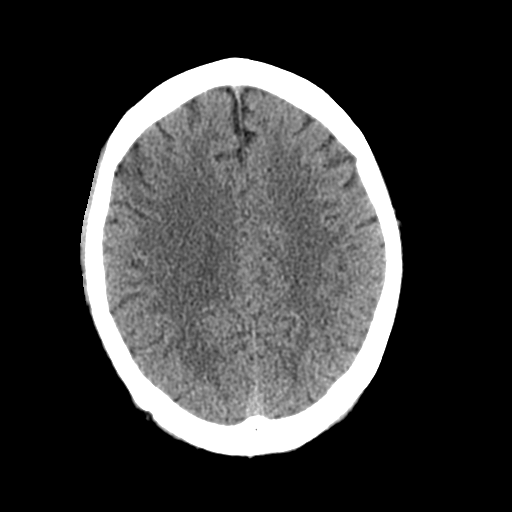
[im 19/31  bone]
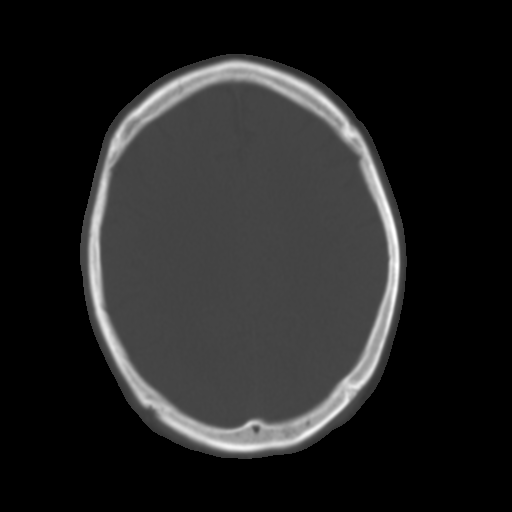
[im 23/31  brain]
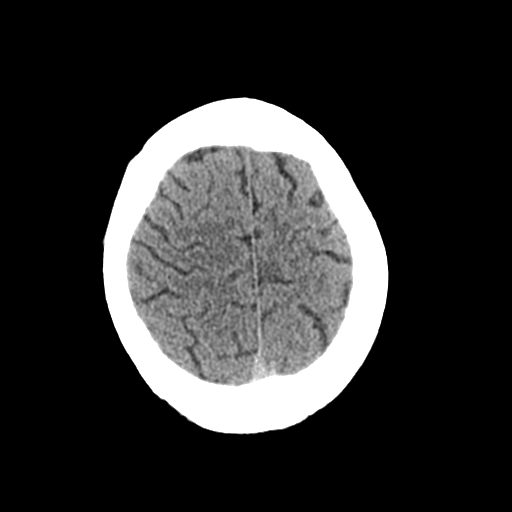
[im 27/31  brain]
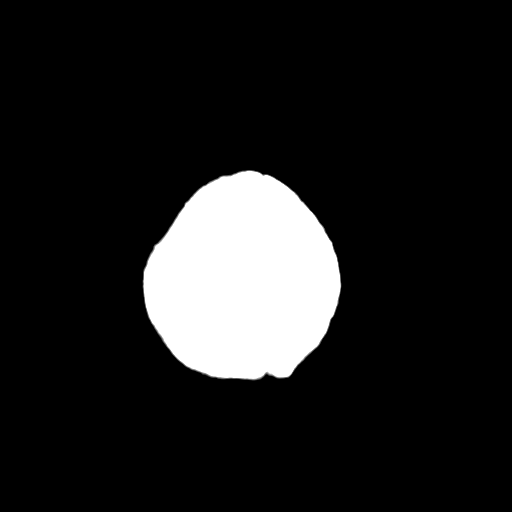

[Series 3: head bone · axial · 0.44mm/px · z∈[-601,-549]mm · 4 of 76 slices shown]
[im 8/76  bone]
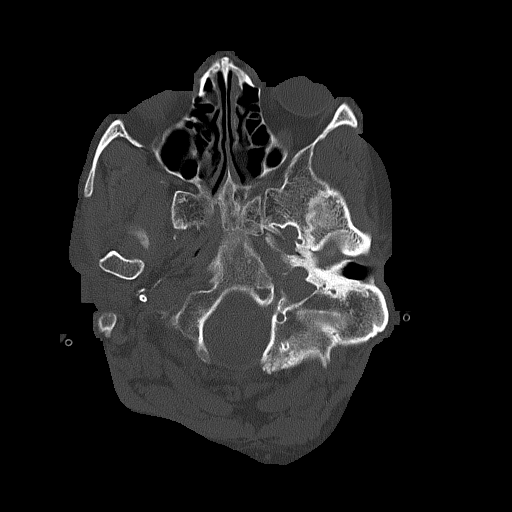
[im 16/76  bone]
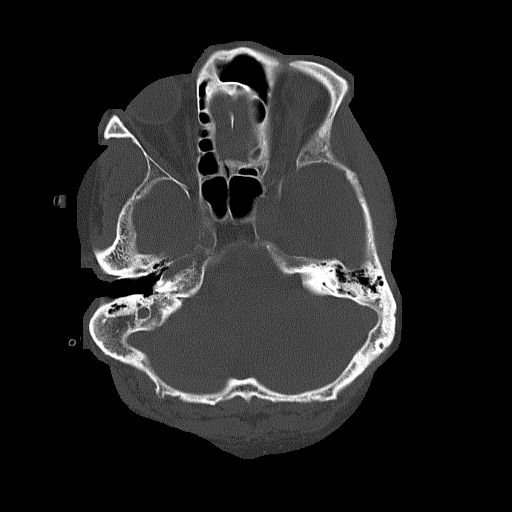
[im 23/76  bone]
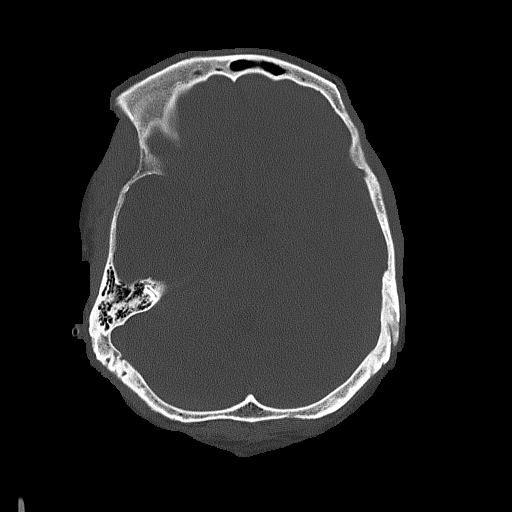
[im 34/76  bone]
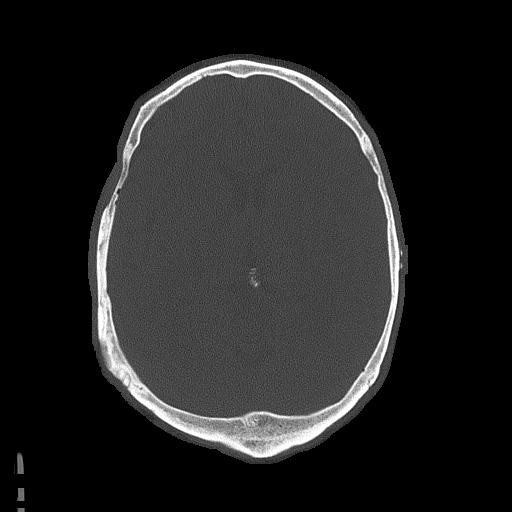

[Series 4: coronal soft · coronal · 0.34mm/px · 3 of 74 slices shown]
[im 25/74  brain]
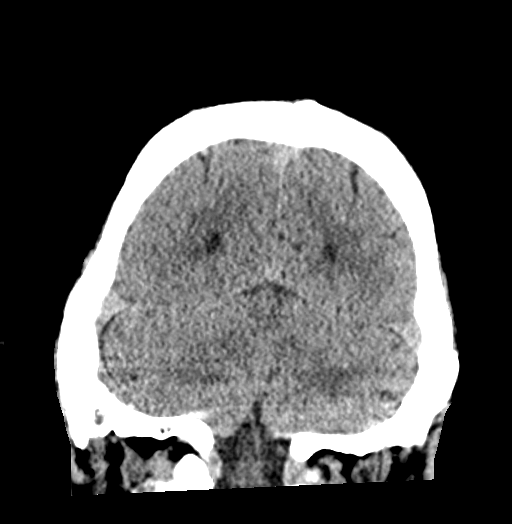
[im 33/74  brain]
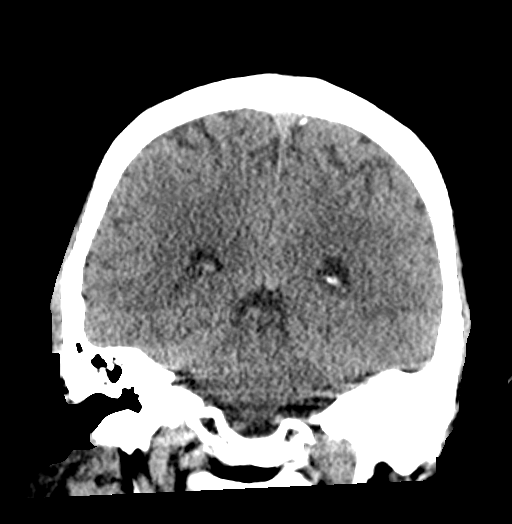
[im 41/74  brain]
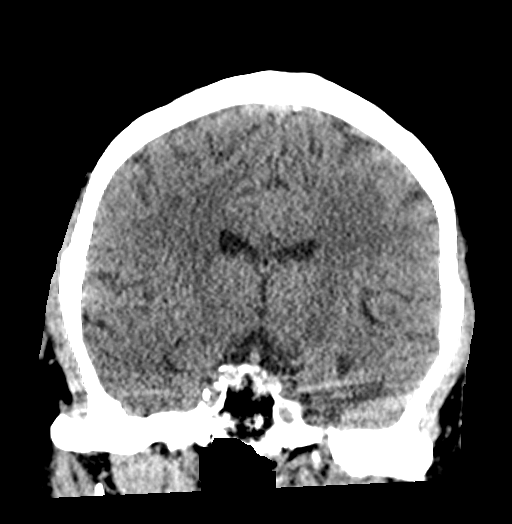

[Series 5: sagittal soft · sagittal · 0.30mm/px · 3 of 57 slices shown]
[im 19/57  brain]
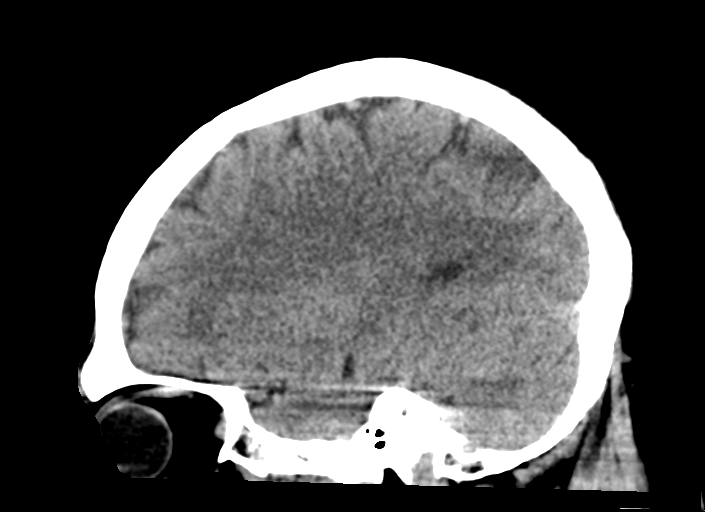
[im 29/57  brain]
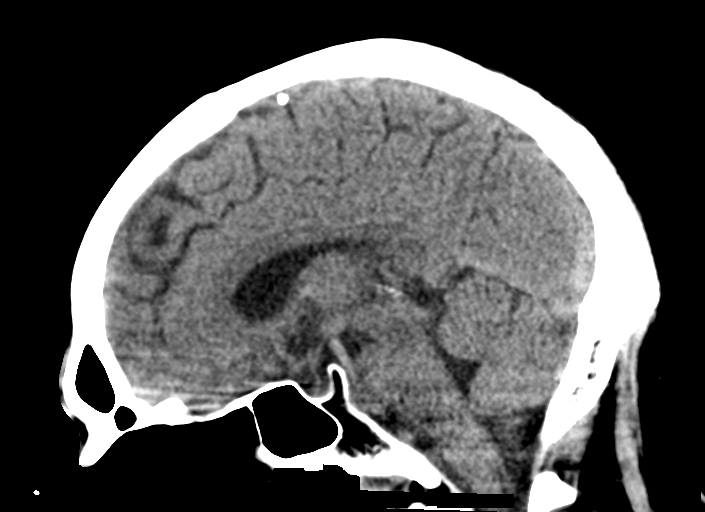
[im 38/57  brain]
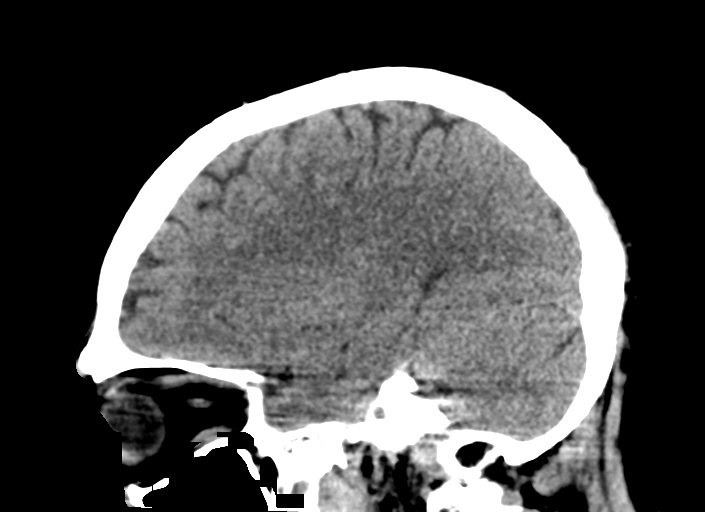

[17 of 47 positions shown; findings below may reference images not displayed]

FINDINGS: Brain: No evidence of acute infarction, hemorrhage, hydrocephalus,
extra-axial collection or mass lesion/mass effect.

Vascular: No hyperdense vessel or unexpected calcification.

Skull: Normal. Negative for fracture or focal lesion.

Sinuses/Orbits: No acute finding.
IMPRESSION: Negative head CT.

## 2021-05-05 MED ORDER — PREDNISONE 20 MG PO TABS
20.0000 mg | ORAL_TABLET | Freq: Every day | ORAL | Status: DC
  Administered 2021-05-05 – 2021-05-06 (×2): 20 mg via ORAL
  Filled 2021-05-05 (×2): qty 1

## 2021-05-05 MED ORDER — FLUTICASONE PROPIONATE 50 MCG/ACT NA SUSP: 2.0000 | Freq: Three times a day (TID) | NASAL | Status: DC | PRN

## 2021-05-05 MED ORDER — POTASSIUM CHLORIDE 10 MEQ/100ML IV SOLN
10.0000 meq | INTRAVENOUS | Status: AC
  Administered 2021-05-05 (×3): 10 meq via INTRAVENOUS
  Filled 2021-05-05 (×3): qty 100

## 2021-05-05 MED ORDER — STROKE: EARLY STAGES OF RECOVERY BOOK
Freq: Once | Status: DC
  Filled 2021-05-05: qty 1

## 2021-05-05 MED ORDER — ACETAMINOPHEN 325 MG PO TABS: 650.0000 mg | ORAL_TABLET | Freq: Four times a day (QID) | ORAL | Status: DC | PRN

## 2021-05-05 MED ORDER — DORZOLAMIDE HCL 2 % OP SOLN
1.0000 [drp] | Freq: Two times a day (BID) | OPHTHALMIC | Status: DC
  Administered 2021-05-06: 1 [drp] via OPHTHALMIC
  Filled 2021-05-05 (×2): qty 10

## 2021-05-05 MED ORDER — ATORVASTATIN CALCIUM 40 MG PO TABS
40.0000 mg | ORAL_TABLET | Freq: Every evening | ORAL | Status: DC
  Administered 2021-05-05: 40 mg via ORAL
  Filled 2021-05-05: qty 1

## 2021-05-05 MED ORDER — DEXAMETHASONE SODIUM PHOSPHATE 10 MG/ML IJ SOLN
10.0000 mg | Freq: Once | INTRAMUSCULAR | Status: AC
  Administered 2021-05-05: 10 mg via INTRAVENOUS
  Filled 2021-05-05: qty 1

## 2021-05-05 MED ORDER — FENTANYL CITRATE (PF) 100 MCG/2ML IJ SOLN
50.0000 ug | Freq: Once | INTRAMUSCULAR | Status: AC
  Administered 2021-05-05: 50 ug via INTRAVENOUS
  Filled 2021-05-05: qty 2

## 2021-05-05 MED ORDER — ONDANSETRON HCL 4 MG PO TABS: 4.0000 mg | ORAL_TABLET | Freq: Four times a day (QID) | ORAL | Status: DC | PRN

## 2021-05-05 MED ORDER — ADULT MULTIVITAMIN W/MINERALS CH
1.0000 | ORAL_TABLET | Freq: Every day | ORAL | Status: DC
  Administered 2021-05-05 – 2021-05-06 (×2): 1 via ORAL
  Filled 2021-05-05 (×2): qty 1

## 2021-05-05 MED ORDER — METOCLOPRAMIDE HCL 5 MG/ML IJ SOLN
10.0000 mg | Freq: Once | INTRAMUSCULAR | Status: AC
  Administered 2021-05-05: 10 mg via INTRAVENOUS
  Filled 2021-05-05: qty 2

## 2021-05-05 MED ORDER — FLUTICASONE PROPIONATE 50 MCG/ACT NA SUSP
2.0000 | Freq: Every day | NASAL | Status: DC | PRN
  Filled 2021-05-05: qty 16

## 2021-05-05 MED ORDER — ENOXAPARIN SODIUM 40 MG/0.4ML IJ SOSY
40.0000 mg | PREFILLED_SYRINGE | INTRAMUSCULAR | Status: DC
  Administered 2021-05-05 – 2021-05-06 (×2): 40 mg via SUBCUTANEOUS
  Filled 2021-05-05 (×2): qty 0.4

## 2021-05-05 MED ORDER — MOMETASONE FURO-FORMOTEROL FUM 200-5 MCG/ACT IN AERO
2.0000 | INHALATION_SPRAY | Freq: Two times a day (BID) | RESPIRATORY_TRACT | Status: DC
  Administered 2021-05-05 – 2021-05-06 (×2): 2 via RESPIRATORY_TRACT
  Filled 2021-05-05 (×2): qty 8.8

## 2021-05-05 MED ORDER — ACETAMINOPHEN 650 MG RE SUPP: 650.0000 mg | RECTAL | Status: DC | PRN

## 2021-05-05 MED ORDER — LATANOPROST 0.005 % OP SOLN
1.0000 [drp] | Freq: Every day | OPHTHALMIC | Status: DC
  Administered 2021-05-05: 1 [drp] via OPHTHALMIC
  Filled 2021-05-05: qty 2.5

## 2021-05-05 MED ORDER — DIPHENHYDRAMINE HCL 50 MG/ML IJ SOLN
25.0000 mg | Freq: Once | INTRAMUSCULAR | Status: AC
  Administered 2021-05-05: 25 mg via INTRAVENOUS
  Filled 2021-05-05: qty 1

## 2021-05-05 MED ORDER — POTASSIUM CHLORIDE IN NACL 40-0.9 MEQ/L-% IV SOLN
INTRAVENOUS | Status: DC
  Filled 2021-05-05: qty 1000

## 2021-05-05 MED ORDER — LORAZEPAM 0.5 MG PO TABS: 0.5000 mg | ORAL_TABLET | Freq: Three times a day (TID) | ORAL | Status: DC | PRN

## 2021-05-05 MED ORDER — BENZTROPINE MESYLATE 1 MG PO TABS
0.5000 mg | ORAL_TABLET | Freq: Every day | ORAL | Status: DC
  Filled 2021-05-05: qty 1

## 2021-05-05 MED ORDER — UMECLIDINIUM BROMIDE 62.5 MCG/INH IN AEPB: 1.0000 | INHALATION_SPRAY | Freq: Every day | RESPIRATORY_TRACT | Status: DC

## 2021-05-05 MED ORDER — IOHEXOL 350 MG/ML SOLN: 100.0000 mL | Freq: Once | INTRAVENOUS | Status: DC | PRN

## 2021-05-05 MED ORDER — MIRTAZAPINE 15 MG PO TABS
30.0000 mg | ORAL_TABLET | Freq: Every day | ORAL | Status: DC
  Filled 2021-05-05: qty 2

## 2021-05-05 MED ORDER — ACETAMINOPHEN 160 MG/5ML PO SOLN: 650.0000 mg | ORAL | Status: DC | PRN

## 2021-05-05 MED ORDER — LOSARTAN POTASSIUM 50 MG PO TABS
50.0000 mg | ORAL_TABLET | Freq: Every day | ORAL | Status: DC
  Filled 2021-05-05: qty 2

## 2021-05-05 MED ORDER — SODIUM CHLORIDE 0.9 % IV SOLN: INTRAVENOUS | Status: DC

## 2021-05-05 MED ORDER — ONDANSETRON HCL 4 MG/2ML IJ SOLN: 4.0000 mg | Freq: Four times a day (QID) | INTRAMUSCULAR | Status: DC | PRN

## 2021-05-05 MED ORDER — LORATADINE 10 MG PO TABS
10.0000 mg | ORAL_TABLET | Freq: Every day | ORAL | Status: DC
  Administered 2021-05-05 – 2021-05-06 (×2): 10 mg via ORAL
  Filled 2021-05-05 (×2): qty 1

## 2021-05-05 MED ORDER — ACETAMINOPHEN 325 MG PO TABS: 650.0000 mg | ORAL_TABLET | ORAL | Status: DC | PRN

## 2021-05-05 MED ORDER — SENNOSIDES-DOCUSATE SODIUM 8.6-50 MG PO TABS: 1.0000 | ORAL_TABLET | Freq: Every evening | ORAL | Status: DC | PRN

## 2021-05-05 MED ORDER — ACETAMINOPHEN 650 MG RE SUPP: 650.0000 mg | Freq: Four times a day (QID) | RECTAL | Status: DC | PRN

## 2021-05-05 MED ORDER — UMECLIDINIUM BROMIDE 62.5 MCG/INH IN AEPB
1.0000 | INHALATION_SPRAY | Freq: Every day | RESPIRATORY_TRACT | Status: DC
  Administered 2021-05-05 – 2021-05-06 (×2): 1 via RESPIRATORY_TRACT
  Filled 2021-05-05: qty 7

## 2021-05-05 MED ORDER — RISPERIDONE 2 MG PO TABS
2.0000 mg | ORAL_TABLET | Freq: Every day | ORAL | Status: DC
  Filled 2021-05-05 (×2): qty 1

## 2021-05-05 MED ORDER — LOSARTAN POTASSIUM 25 MG PO TABS: 100.0000 mg | ORAL_TABLET | Freq: Every day | ORAL | Status: DC

## 2021-05-05 MED ORDER — TIOTROPIUM BROMIDE MONOHYDRATE 2.5 MCG/ACT IN AERS: 2.0000 | INHALATION_SPRAY | Freq: Every day | RESPIRATORY_TRACT | Status: DC

## 2021-05-05 MED ORDER — ENOXAPARIN SODIUM 40 MG/0.4ML IJ SOSY: 40.0000 mg | PREFILLED_SYRINGE | INTRAMUSCULAR | Status: DC

## 2021-05-05 NOTE — ED Notes (Signed)
Pt comfortably, dinner tray at the bedside.

## 2021-05-05 NOTE — ED Provider Notes (Signed)
[facility] EMERGENCY DEPARTMENT Provider Note   CSN: [id number] Arrival date & time: 05/05/21  0158     History Chief Complaint  Patient presents with   Shoulder Pain    Left shoulder pain, headache, feet swelling, left hand tingling    Tony Sullivan is a 60 y.o. male.  Patient presents with multiple complaints. He has a headache, left shoulder pain, left arm paresthesias and weakness. Started while going to bed approximately 3 hours prior to arrival. Has a h/o of cervical fusion, no h/o stroke. Does have h/o of multiple breathing issues. No other associated symptoms.    Shoulder Pain     Past Medical History:  Diagnosis Date   Anxiety    Arthritis    COPD (chronic obstructive pulmonary disease) ([place]) 2004   diagnosed in 2004, no PFT's to date.  Started on home O2 12/2013, after found to be desatting at PCP's office, and referred to pulmonology.   Depression    GERD (gastroesophageal reflux disease)    High blood pressure    Prediabetes    Seasonal allergies    Tobacco dependence     Patient Active Problem List   Diagnosis Date Noted   Left-sided weakness 05/05/2021   Leukocytosis 05/05/2021   Hyperlipidemia 05/24/2020   Hypokalemia 05/24/2020   Chest pain 05/23/2020   Bipolar disorder ([place]) 05/23/2020   HNP (herniated nucleus pulposus), cervical 09/06/2019   Abdominal pain    Poor dentition 11/04/2017   Acute on chronic respiratory failure with hypoxia ([place]) 08/29/2017   ETOH abuse 03/30/2017   Cigarette smoker 03/30/2017   Acute respiratory failure with hypoxia and hypercapnia ([facility]) 03/29/2017   Special screening for malignant neoplasms, colon    Benign neoplasm of ascending colon    Benign neoplasm of descending colon    Hoarseness 08/22/2016   GERD (gastroesophageal reflux disease) 08/22/2016   Atypical chest pain 12/05/2015   Essential hypertension 06/15/2014   Pectoralis muscle strain 01/11/2014   COPD with acute exacerbation ([place]) 01/10/2014   COPD   GOLD III 01/08/2014   Smoker 01/08/2014   Cough 01/08/2014   Chronic respiratory failure with hypoxia ([place]) 01/08/2014    Past Surgical History:  Procedure Laterality Date   ANTERIOR CERVICAL DECOMP/DISCECTOMY FUSION N/A 09/06/2019   Procedure: Anterior Cervical Discecectomy Fusion - Cerivcal Five-Cervical Six;  Surgeon: Kary Kos, MD;  Location: [place];  Service: Neurosurgery;  Laterality: N/A;  Anterior Cervical Discecectomy Fusion - Cerivcal Five-Cervical Six   ANTERIOR CERVICAL DISCECTOMY  09/06/2019   APPENDECTOMY  1980   COLONOSCOPY WITH PROPOFOL N/A 12/10/2016   Procedure: COLONOSCOPY WITH PROPOFOL;  Surgeon: [name], MD;  Location: [facility];  Service: Endoscopy;  Laterality: N/A;   HIP SURGERY Right    SHOULDER ARTHROSCOPY Right        Family History  Problem Relation Age of Onset   Emphysema Mother    Allergies Mother        "everyone in family"   Asthma Mother        "everyone in family"   Heart disease Mother    Clotting disorder Mother    Cancer Mother    Emphysema Father    Allergies Father    Allergies Son    Seizures Brother    Diabetes Brother     Social History   Tobacco Use   Smoking status: Some Days    Packs/day: 0.00    Years: 36.00    Pack years: 0.00    Types:  Cigarettes   Smokeless tobacco: Never   Tobacco comments:    smokes 2 cigarettes per day 11/06/2020  Vaping Use   Vaping Use: Never used  Substance Use Topics   Alcohol use: Not Currently    Alcohol/week: 14.0 - 21.0 standard drinks    Types: 14 - 21 Standard drinks or equivalent per week    Comment: 2-3 beers/day, with occasional "binges".  No history of withdrawal   Drug use: Not Currently    Types: Marijuana, Cocaine    Comment: last cocaine use last month    Home Medications Prior to Admission medications   Medication Sig Start Date End Date Taking? Authorizing Provider  albuterol (PROAIR HFA) 108 (90 Base) MCG/ACT inhaler Inhale 2 puffs into the lungs every 4  (four) hours as needed. 03/19/21   Fredia Sorrow, MD  albuterol (PROVENTIL) (2.5 MG/3ML) 0.083% nebulizer solution Take 3 mLs (2.5 mg total) by nebulization every 4 (four) hours as needed for wheezing or shortness of breath. 04/28/18   [name], NP  atorvastatin (LIPITOR) 40 MG tablet TAKE ONE TABLET BY MOUTH DAILY Patient taking differently: Take 40 mg by mouth at bedtime. 07/06/20   [name], MD  benztropine (COGENTIN) 0.5 MG tablet Take 0.5 mg by mouth at bedtime.     [provider]  budesonide-formoterol (SYMBICORT) 160-4.5 MCG/ACT inhaler INHALE TWO PUFFS BY MOUTH INTO THE LUNGS TWICE A DAY 03/19/21   Fredia Sorrow, MD  cetirizine (ZYRTEC) 10 MG tablet Take 10 mg by mouth daily.    [provider]  diclofenac Sodium (VOLTAREN) 1 % GEL Apply 2 g topically 4 (four) times daily.    [provider]  dorzolamide (TRUSOPT) 2 % ophthalmic solution Place 1 drop into the left eye 2 (two) times daily. 12/12/20   [provider]  famotidine (PEPCID) 20 MG tablet TAKE 1 TABLET (20 MG TOTAL) BY MOUTH AT BEDTIME. 02/05/21   [name], MD  fluticasone (FLONASE) 50 MCG/ACT nasal spray Place 2 sprays into both nostrils 3 (three) times daily as needed for allergies.     [provider]  furosemide (LASIX) 20 MG tablet Take 1 tablet (20 mg total) by mouth daily. 05/01/21   [name], MD  hydrochlorothiazide (MICROZIDE) 12.5 MG capsule Take 1 capsule (12.5 mg total) by mouth every morning. 01/29/21   Tolia, Sunit, DO  latanoprost (XALATAN) 0.005 % ophthalmic solution Place 1 drop into both eyes at bedtime. 05/24/19   [provider]  levofloxacin (LEVAQUIN) 500 MG tablet Take 1 tablet (500 mg total) by mouth daily. Patient not taking: Reported on 04/12/2021 04/01/21   Varney Biles, MD  LORazepam (ATIVAN) 1 MG tablet Take 1 tablet (1 mg total) by mouth 3 (three) times daily as needed for anxiety. 02/15/21   Daleen Bo, MD  losartan  (COZAAR) 100 MG tablet Take 100 mg by mouth daily.    [provider]  loxapine (LOXITANE) 10 MG capsule Take 10 mg by mouth at bedtime.     [provider]  megestrol (MEGACE) 40 MG tablet Take 40 mg by mouth daily.    [provider]  mirtazapine (REMERON) 30 MG tablet Take 30 mg by mouth at bedtime.    [provider]  Multiple Vitamin (MULTIVITAMIN) capsule Take 1 capsule by mouth daily.    [provider]  mupirocin cream (BACTROBAN) 2 % Apply 1 application topically 2 (two) times daily. 05/01/21   [name], MD  OXYGEN Inhale 3-4  L into the lungs as directed. 3-4 liters with sleep and exertion as needed for low oxygen    [provider]  pantoprazole (PROTONIX) 40 MG tablet Take 40 mg by mouth daily.    [provider]  polyethylene glycol (MIRALAX / GLYCOLAX) 17 g packet TAKE 17 GRAMS BY MOUTH DAILY 03/09/20   [name], MD  potassium chloride SA (KLOR-CON) 20 MEQ tablet Take 1 tablet (20 mEq total) by mouth 2 (two) times daily. Patient not taking: No sig reported 02/15/21   Daleen Bo, MD  predniSONE (DELTASONE) 10 MG tablet Take 2 tablets (20 mg total) by mouth daily. 04/12/21   [name], MD  risperiDONE (RISPERDAL) 2 MG tablet Take 2 mg by mouth at bedtime.    [provider]  Tiotropium Bromide Monohydrate (SPIRIVA RESPIMAT) 2.5 MCG/ACT AERS Inhale 2 puffs into the lungs daily. 03/19/21 04/18/21  Fredia Sorrow, MD    Allergies    Penicillins and Levocetirizine  Review of Systems   Review of Systems  All other systems reviewed and are negative.  Physical Exam Updated Vital Signs BP 130/89   Pulse 85   Temp 98.5 F (36.9 C) (Oral)   Resp (!) 22   Ht 5\' 11"  (1.803 m)   Wt 72.6 kg   SpO2 98%   BMI 22.32 kg/m   Physical Exam Vitals and nursing note reviewed.  Constitutional:      Appearance: He is well-developed.  HENT:     Head: Normocephalic and atraumatic.     Nose: Nose normal. No  congestion or rhinorrhea.     Mouth/Throat:     Mouth: Mucous membranes are moist.     Pharynx: Oropharynx is clear.  Eyes:     Pupils: Pupils are equal, round, and reactive to light.  Cardiovascular:     Rate and Rhythm: Normal rate.  Pulmonary:     Effort: Pulmonary effort is normal. No respiratory distress.  Abdominal:     General: There is no distension.  Musculoskeletal:        General: Normal range of motion.     Cervical back: Normal range of motion.  Skin:    General: Skin is warm and dry.  Neurological:     Mental Status: He is alert.     Comments: Possible left facial droop/loss of nasolabial fold but patient not super compliant with physical exam.  Has left arm decreased sensation associated with paresthesias, decreased strength. Normal grip strength though.  Le with symmetric strength and sensation    ED Results / Procedures / Treatments   Labs (all labs ordered are listed, but only abnormal results are displayed) Labs Reviewed  CBC - Abnormal; Notable for the following components:      Result Value   WBC 14.8 (*)    All other components within normal limits  DIFFERENTIAL - Abnormal; Notable for the following components:   Neutro Abs 10.0 (*)    Monocytes Absolute 1.4 (*)    Abs Immature Granulocytes 0.21 (*)    All other components within normal limits  COMPREHENSIVE METABOLIC PANEL - Abnormal; Notable for the following components:   Potassium 3.2 (*)    Calcium 8.7 (*)    All other components within normal limits  I-STAT CHEM 8, ED - Abnormal; Notable for the following components:   Potassium 3.3 (*)    All other components within normal limits  RESP PANEL BY RT-PCR (FLU A&B, COVID) ARPGX2  ETHANOL  PROTIME-INR  APTT  RAPID URINE DRUG SCREEN, HOSP PERFORMED  URINALYSIS, ROUTINE W REFLEX MICROSCOPIC    EKG EKG Interpretation  Date/Time:  Saturday May 05 2021 02:41:30 EDT Ventricular Rate:  86 PR Interval:  157 QRS Duration: 87 QT  Interval:  370 QTC Calculation: 443 R Axis:   -78 Text Interpretation: Sinus rhythm Left anterior fascicular block Consider anterior infarct Confirmed by Merrily Pew [id number]) on 05/05/2021 5:17:14 AM  Radiology CT HEAD WO CONTRAST  Result Date: 05/05/2021 CLINICAL DATA:  Headache and left shoulder pain. Pins and needles sensation in the left hand. Acute stroke suspected. EXAM: CT HEAD WITHOUT CONTRAST TECHNIQUE: Contiguous axial images were obtained from the base of the skull through the vertex without intravenous contrast. COMPARISON:  03/20/2009 FINDINGS: Brain: No evidence of acute infarction, hemorrhage, hydrocephalus, extra-axial collection or mass lesion/mass effect. Vascular: No hyperdense vessel or unexpected calcification. Skull: Normal. Negative for fracture or focal lesion. Sinuses/Orbits: No acute finding. IMPRESSION: Negative head CT. Electronically Signed   By: Monte Fantasia M.D.   On: 05/05/2021 05:30    Procedures Procedures   Medications Ordered in ED Medications  iohexol (OMNIPAQUE) 350 MG/ML injection 100 mL (has no administration in time range)   stroke: mapping our early stages of recovery book (has no administration in time range)  enoxaparin (LOVENOX) injection 40 mg (has no administration in time range)  acetaminophen (TYLENOL) tablet 650 mg (has no administration in time range)    Or  acetaminophen (TYLENOL) suppository 650 mg (has no administration in time range)  ondansetron (ZOFRAN) tablet 4 mg (has no administration in time range)    Or  ondansetron (ZOFRAN) injection 4 mg (has no administration in time range)  0.9 % NaCl with KCl 40 mEq / L  infusion (has no administration in time range)  fentaNYL (SUBLIMAZE) injection 50 mcg (50 mcg Intravenous Given 05/05/21 0239)  metoCLOPramide (REGLAN) injection 10 mg (10 mg Intravenous Given 05/05/21 0559)  diphenhydrAMINE (BENADRYL) injection 25 mg (25 mg Intravenous Given 05/05/21 0558)  dexamethasone (DECADRON) injection  10 mg (10 mg Intravenous Given 05/05/21 8329)    ED Course  I have reviewed the triage vital signs and the nursing notes.  Pertinent labs & imaging results that were available during my care of the patient were reviewed by me and considered in my medical decision making (see chart for details).    MDM Rules/Calculators/A&P                          Possibly cervical? Pain related? Neuro consult to ensure not stroke.   Neuro thinks possible MSK but needs stroke wu. Pending ct head.  D/w hospitalist for admission.    Final Clinical Impression(s) / ED Diagnoses Final diagnoses:  Weakness  Paresthesia    Rx / DC Orders ED Discharge Orders     None        Quincie Haroon, [name], MD 05/05/21 [phone]

## 2021-05-05 NOTE — ED Notes (Signed)
[facility] at bedside to transfer pt  

## 2021-05-05 NOTE — Plan of Care (Signed)
  Problem: Acute Rehab PT Goals(only PT should resolve) Goal: Pt Will Go Supine/Side To Sit Outcome: Progressing Flowsheets (Taken 05/05/2021 1149) Pt will go Supine/Side to Sit: with modified independence Goal: Patient Will Transfer Sit To/From Stand Outcome: Progressing Flowsheets (Taken 05/05/2021 1149) Patient will transfer sit to/from stand: with min guard assist Goal: Pt Will Transfer Bed To Chair/Chair To Bed Outcome: Progressing Flowsheets (Taken 05/05/2021 1149) Pt will Transfer Bed to Chair/Chair to Bed: with min assist Goal: Pt Will Ambulate Outcome: Progressing Flowsheets (Taken 05/05/2021 1149) Pt will Ambulate:  25 feet  with least restrictive assistive device  with minimal assist Goal: Pt/caregiver will Perform Home Exercise Program Outcome: Progressing Flowsheets (Taken 05/05/2021 1149) Pt/caregiver will Perform Home Exercise Program:  For increased strengthening  For improved balance  Independently 11:50 AM, 05/05/21 Mearl Latin PT, DPT Physical Therapist at [facility]

## 2021-05-05 NOTE — ED Triage Notes (Signed)
Pt from home, presents to ED with c/o headache, left shoulder pain, left hand feels like "pins and needles", bilateral feet swelling-pt was seen for swelling 4 days ago.

## 2021-05-05 NOTE — Evaluation (Signed)
Physical Therapy Evaluation Patient Details Name: Tony Sullivan MRN: [id number] DOB: 12-04-1960 Today's Date: 05/05/2021   History of Present Illness  Tony Sullivan is a 60 y.o. male with medical history significant of anxiety, osteoarthritis, COPD on home oxygen, depression, GERD, hypertension, prediabetes, seasonal allergies, tobacco and cocaine use, history of cervical fusion who is coming to the emergency department with complaints of left-sided tingling and weakness associated with frontal headache that started about 3 hours before arriving to the emergency department.  He denies blurred vision, dizziness or language difficulties.  No nausea or vomiting.  No gait disturbance.  No fever, chills, sore throat rhinorrhea, hemoptysis, but gets frequent wheezing and dyspnea.  No chest pain, palpitations, diaphoresis, PND, dyspnea or pitting edema of the lower extremities.  Denies abdominal pain, diarrhea, constipation, melena or hematochezia.   Clinical Impression  Patient limited for functional mobility as stated below secondary to BLE weakness, fatigue and impaired activity tolerance. Patient requires min assist to pull to seated EOB secondary to weakness. Patient with good sitting balance and sitting tolerance EOB. Patient with slight decrease in L hip flexor strength compared to R. Patient with equal but overall decreased strength in bilateral LE. Patient stating decreased light touch sensation in LLE. Patient limited by fatigue with sitting EOB and returns to supine. Patient will benefit from continued physical therapy in hospital and recommended venue below to increase strength, balance, endurance for safe ADLs and gait.     Follow Up Recommendations SNF    Equipment Recommendations  None recommended by PT    Recommendations for Other Services       Precautions / Restrictions Precautions Precautions: Fall Restrictions Weight Bearing Restrictions: No      Mobility  Bed  Mobility Overal bed mobility: Needs Assistance Bed Mobility: Supine to Sit;Sit to Supine     Supine to sit: Min assist;HOB elevated Sit to supine: Min guard   General bed mobility comments: minimal assist to upright trunk to transition to seated EoB    Transfers                    Ambulation/Gait                Stairs            Wheelchair Mobility    Modified Rankin (Stroke Patients Only)       Balance                                             Pertinent Vitals/Pain Pain Assessment: No/denies pain    Home Living Family/patient expects to be discharged to:: Private residence Living Arrangements: Alone Available Help at Discharge: Family Type of Home: House Home Access: Ramped entrance       Home Equipment: Environmental consultant - 2 wheels;Cane - single point;Wheelchair - power;Bedside commode Additional Comments: Patient states aid assists a few hours per day    Prior Function Level of Independence: Independent with assistive device(s);Needs assistance   Gait / Transfers Assistance Needed: household ambulator using SPC, uses power w/c for longer distances  ADL's / Homemaking Assistance Needed: requires assist        Hand Dominance        Extremity/Trunk Assessment   Upper Extremity Assessment Upper Extremity Assessment: Defer to OT evaluation    Lower Extremity Assessment Lower Extremity Assessment: Generalized weakness;LLE deficits/detail LLE  Deficits / Details: minimally decreased L hip flex strength, 4+/5 strength throughout bilateral LE LLE Sensation: decreased light touch    Cervical / Trunk Assessment Cervical / Trunk Assessment: Normal  Communication   Communication: No difficulties  Cognition Arousal/Alertness: Awake/alert Behavior During Therapy: WFL for tasks assessed/performed Overall Cognitive Status: Within Functional Limits for tasks assessed                                         General Comments      Exercises     Assessment/Plan    PT Assessment Patient needs continued PT services  PT Problem List Decreased strength;Decreased activity tolerance;Decreased balance;Decreased mobility;Cardiopulmonary status limiting activity       PT Treatment Interventions DME instruction;Gait training;Stair training;Functional mobility training;Therapeutic activities;Therapeutic exercise;Balance training;Neuromuscular re-education;Patient/family education    PT Goals (Current goals can be found in the Care Plan section)  Acute Rehab PT Goals Patient Stated Goal: Get stronger PT Goal Formulation: With patient Time For Goal Achievement: 05/19/21 Potential to Achieve Goals: Fair    Frequency Min 3X/week   Barriers to discharge        Co-evaluation               AM-PAC PT "6 Clicks" Mobility  Outcome Measure Help needed turning from your back to your side while in a flat bed without using bedrails?: None Help needed moving from lying on your back to sitting on the side of a flat bed without using bedrails?: A Little Help needed moving to and from a bed to a chair (including a wheelchair)?: A Lot Help needed standing up from a chair using your arms (e.g., wheelchair or bedside chair)?: A Little Help needed to walk in hospital room?: A Lot Help needed climbing 3-5 steps with a railing? : Total 6 Click Score: 15    End of Session Equipment Utilized During Treatment: Oxygen Activity Tolerance: Patient limited by fatigue Patient left: in bed;with call bell/phone within reach Nurse Communication: Mobility status PT Visit Diagnosis: Unsteadiness on feet (R26.81);Other abnormalities of gait and mobility (R26.89);Muscle weakness (generalized) (M62.81)    Time: 3419-6222 PT Time Calculation (min) (ACUTE ONLY): 10 min   Charges:   PT Evaluation $PT Eval Low Complexity: 1 Low          11:47 AM, 05/05/21 Mearl Latin PT, DPT Physical Therapist at [facility]

## 2021-05-05 NOTE — ED Notes (Signed)
Attempted to call report- nurse busy with another pt- will call back with any questions

## 2021-05-05 NOTE — ED Notes (Addendum)
Pt motorized (with power chord in back pocket) WC placed in EMS locker room with pt label- pt says he will pick it up after he is discharged. Pt taking his black oxygen bag and pt  Belonging bag with shoes and black hat with him.

## 2021-05-05 NOTE — Consult Note (Signed)
[facility] TeleNeurology Consult Services  Stat Consult  Date of Service:   05/05/2021 02:32:55  Diagnosis:       R51.9 - Headache, unspecified  Impression: 60 year old male who presents with left side numbness and headache. Presentation may be due to complex migraine vs small stroke.  Our recommendations are outlined below.  Diagnostic Studies: Recommend MRI brain without contrast Routine MRA head without contrast and MRA Neck with contrast  Laboratory Studies: Recommend Lipid panel Hemoglobin A1c  Medication: Initiate Aspirin 81 mg daily  Nursing Recommendations: Telemetry, IV Fluids, avoid dextrose containing fluids, Maintain euglycemia Neuro checks q4 hrs x 24 hrs and then per shift Head of bed 30 degrees  Consultations: Recommend Speech therapy if failed dysphagia screen Physical therapy/Occupational therapy  DVT Prophylaxis: Choice of Primary Team  Disposition: Neurology will follow   Metrics: [facility] Notification Time: 05/05/2021 02:31:21 Stamp Time: 05/05/2021 02:32:55 Callback Response Time: 05/05/2021 02:33:41   ----------------------------------------------------------------------------------------------------  Chief Complaint: left side numbness  History of Present Illness: Patient is a 60 year old Male.  60 year old male who presents to the hospital because of left side numbness, headache, and left shoulder pain. Patient states he was laying bed and suddenly developed pain in his left arm which radiated up into his neck and head followed by decreased sensation on his left side.          Examination: BP(144/99), Pulse(92), Blood Glucose(93) 1A: Level of Consciousness - Alert; keenly responsive + 0 1B: Ask Month and Age - Both Questions Right + 0 1C: Blink Eyes & Squeeze Hands - Performs Both Tasks + 0 2: Test Horizontal Extraocular Movements - Normal + 0 3: Test Visual Peters[place] - No Visual Loss + 0 4: Test  Facial Palsy (Use Grimace if Obtunded) - Normal symmetry + 0 5A: Test Left Arm Motor Drift - No Drift for 10 Seconds + 0 5B: Test Right Arm Motor Drift - No Drift for 10 Seconds + 0 6A: Test Left Leg Motor Drift - No Drift for 5 Seconds + 0 6B: Test Right Leg Motor Drift - No Drift for 5 Seconds + 0 7: Test Limb Ataxia (FNF/Heel-Shin) - No Ataxia + 0 8: Test Sensation - Mild-Moderate Loss: Less Sharp/More Dull + 1 9: Test Language/Aphasia - Normal; No aphasia + 0 10: Test Dysarthria - Normal + 0 11: Test Extinction/Inattention - No abnormality + 0  NIHSS Score: 1     Patient / Family was informed the Neurology Consult would occur via TeleHealth consult by way of interactive audio and video telecommunications and consented to receiving care in this manner.  Patient is being evaluated for possible acute neurologic impairment and high probability of imminent or life - threatening deterioration.I spent total of 20 minutes providing care to this patient, including time for face to face visit via telemedicine, review of medical records, imaging studies and discussion of findings with providers, the patient and / or family.   Dr Tsosie Billing   [facility] [phone]  Case [id number]

## 2021-05-05 NOTE — Progress Notes (Signed)
*  PRELIMINARY RESULTS* Echocardiogram 2D Echocardiogram has been performed.  Tony Sullivan 05/05/2021, 10:18 AM

## 2021-05-05 NOTE — Progress Notes (Signed)
ASSUMPTION OF CARE NOTE   05/05/2021 3:33 PM  Tony Sullivan was seen and examined.  The H&P by the admitting provider, orders, imaging was reviewed.  Please see new orders.  I have reconciled home meds that patient could confirm verbally with me and asked pharm D to assist with fully reconciling home meds.  Pt being admitted to [facility] due to no MRI or neurology service at this facility.  His symptoms seem to be improved at this time.  Will continue to follow while waiting for transfer.   Vitals:   05/05/21 1200 05/05/21 1230  BP: (!) 143/82 (!) 134/92  Pulse: 63   Resp: (!) 21 18  Temp:    SpO2: 96%     Results for orders placed or performed during the hospital encounter of 05/05/21  Resp Panel by RT-PCR (Flu A&B, Covid) Nasopharyngeal Swab   Specimen: Nasopharyngeal Swab; Nasopharyngeal(NP) swabs in vial transport medium  Result Value Ref Range   SARS Coronavirus 2 by RT PCR NEGATIVE NEGATIVE   Influenza A by PCR NEGATIVE NEGATIVE   Influenza B by PCR NEGATIVE NEGATIVE  Ethanol  Result Value Ref Range   Alcohol, Ethyl (B) <10 <10 mg/dL  Protime-INR  Result Value Ref Range   Prothrombin Time 12.4 11.4 - 15.2 seconds   INR 0.9 0.8 - 1.2  APTT  Result Value Ref Range   aPTT 27 24 - 36 seconds  CBC  Result Value Ref Range   WBC 14.8 (H) 4.0 - 10.5 K/uL   RBC 4.41 4.22 - 5.81 MIL/uL   Hemoglobin 14.1 13.0 - 17.0 g/dL   HCT 44.1 39.0 - 52.0 %   MCV 100.0 80.0 - 100.0 fL   MCH 32.0 26.0 - 34.0 pg   MCHC 32.0 30.0 - 36.0 g/dL   RDW 15.1 11.5 - 15.5 %   Platelets 254 150 - 400 K/uL   nRBC 0.0 0.0 - 0.2 %  Differential  Result Value Ref Range   Neutrophils Relative % 68 %   Neutro Abs 10.0 (H) 1.7 - 7.7 K/uL   Lymphocytes Relative 21 %   Lymphs Abs 3.2 0.7 - 4.0 K/uL   Monocytes Relative 9 %   Monocytes Absolute 1.4 (H) 0.1 - 1.0 K/uL   Eosinophils Relative 0 %   Eosinophils Absolute 0.1 0.0 - 0.5 K/uL   Basophils Relative 1 %   Basophils Absolute 0.1 0.0 - 0.1 K/uL    Immature Granulocytes 1 %   Abs Immature Granulocytes 0.21 (H) 0.00 - 0.07 K/uL  Comprehensive metabolic panel  Result Value Ref Range   Sodium 138 135 - 145 mmol/L   Potassium 3.2 (L) 3.5 - 5.1 mmol/L   Chloride 102 98 - 111 mmol/L   CO2 29 22 - 32 mmol/L   Glucose, Bld 95 70 - 99 mg/dL   BUN 19 6 - 20 mg/dL   Creatinine, Ser 1.01 0.61 - 1.24 mg/dL   Calcium 8.7 (L) 8.9 - 10.3 mg/dL   Total Protein 6.5 6.5 - 8.1 g/dL   Albumin 3.7 3.5 - 5.0 g/dL   AST 25 15 - 41 U/L   ALT 27 0 - 44 U/L   Alkaline Phosphatase 60 38 - 126 U/L   Total Bilirubin 1.1 0.3 - 1.2 mg/dL   GFR, Estimated >60 >60 mL/min   Anion gap 7 5 - 15  Lipid panel  Result Value Ref Range   Cholesterol 126 0 - 200 mg/dL   Triglycerides 62 <150 mg/dL  HDL 56 >40 mg/dL   Total CHOL/HDL Ratio 2.3 RATIO   VLDL 12 0 - 40 mg/dL   LDL Cholesterol 58 0 - 99 mg/dL  Hemoglobin A1c  Result Value Ref Range   Hgb A1c MFr Bld 6.1 (H) 4.8 - 5.6 %   Mean Plasma Glucose 128.37 mg/dL  I-stat chem 8, ED  Result Value Ref Range   Sodium 141 135 - 145 mmol/L   Potassium 3.3 (L) 3.5 - 5.1 mmol/L   Chloride 103 98 - 111 mmol/L   BUN 17 6 - 20 mg/dL   Creatinine, Ser 1.10 0.61 - 1.24 mg/dL   Glucose, Bld 93 70 - 99 mg/dL   Calcium, Ion 1.17 1.15 - 1.40 mmol/L   TCO2 30 22 - 32 mmol/L   Hemoglobin 15.0 13.0 - 17.0 g/dL   HCT 44.0 39.0 - 52.0 %  ECHOCARDIOGRAM COMPLETE  Result Value Ref Range   Weight 2,560.86 oz   Height 71 in   BP 135/86 mmHg   Area-P 1/2 2.75 cm2   S' Lateral 2.20 cm   Murvin Natal, MD [facility]   05/05/2021  2:00 AM How to contact the [facility] Attending or Consulting provider 7A - 7P or covering provider during after hours 7P -7A, for this patient?  Check the care team in [facility] and look for a) attending/consulting TRH provider listed and b) the [facility] team listed Log into [email or web address] and use [place]'s universal password to access. If you do not have the password, please contact the hospital  operator. Locate the [facility] provider you are looking for under [facility] and page to a number that you can be directly reached. If you still have difficulty reaching the provider, please page the [facility] (Director on Call) for the Hospitalists listed on amion for assistance.

## 2021-05-05 NOTE — H&P (Signed)
History and Physical    Tony Sullivan NOM:[id number] DOB: 1960/11/25 DOA: 05/05/2021  PCP: [name], FNP   Patient coming from: Home.   I have personally briefly reviewed patient's old medical records in [place]  Chief Complaint: Left-sided weakness.    HPI: Tony Sullivan is a 60 y.o. male with medical history significant of anxiety, osteoarthritis, COPD on home oxygen, depression, GERD, hypertension, prediabetes, seasonal allergies, tobacco and cocaine use, history of cervical fusion who is coming to the emergency department with complaints of left-sided tingling and weakness associated with frontal headache that started about 3 hours before arriving to the emergency department.  He denies blurred vision, dizziness or language difficulties.  No nausea or vomiting.  No gait disturbance.  No fever, chills, sore throat rhinorrhea, hemoptysis, but gets frequent wheezing and dyspnea.  No chest pain, palpitations, diaphoresis, PND, dyspnea or pitting edema of the lower extremities.  Denies abdominal pain, diarrhea, constipation, melena or hematochezia.  ED Course: Initial vital signs were temperature   Review of Systems: As per HPI otherwise all other systems reviewed and are negative.  Past Medical History:  Diagnosis Date   Anxiety    Arthritis    COPD (chronic obstructive pulmonary disease) ([place]) 2004   diagnosed in 2004, no PFT's to date.  Started on home O2 12/2013, after found to be desatting at PCP's office, and referred to pulmonology.   Depression    GERD (gastroesophageal reflux disease)    High blood pressure    Prediabetes    Seasonal allergies    Tobacco dependence     Past Surgical History:  Procedure Laterality Date   ANTERIOR CERVICAL DECOMP/DISCECTOMY FUSION N/A 09/06/2019   Procedure: Anterior Cervical Discecectomy Fusion - Cerivcal Five-Cervical Six;  Surgeon: Kary Kos, MD;  Location: [place];  Service: Neurosurgery;  Laterality: N/A;  Anterior  Cervical Discecectomy Fusion - Cerivcal Five-Cervical Six   ANTERIOR CERVICAL DISCECTOMY  09/06/2019   APPENDECTOMY  1980   COLONOSCOPY WITH PROPOFOL N/A 12/10/2016   Procedure: COLONOSCOPY WITH PROPOFOL;  Surgeon: [name], MD;  Location: [facility];  Service: Endoscopy;  Laterality: N/A;   HIP SURGERY Right    SHOULDER ARTHROSCOPY Right     Social History  reports that he has been smoking cigarettes. He has never used smokeless tobacco. He reports previous alcohol use of about 14.0 - 21.0 standard drinks of alcohol per week. He reports previous drug use. Drugs: Marijuana and Cocaine.  Allergies  Allergen Reactions   Penicillins Anaphylaxis    Has patient had a PCN reaction causing immediate rash, facial/tongue/throat swelling, SOB or lightheadedness with hypotension: Yes Has patient had a PCN reaction causing severe rash involving mucus membranes or skin necrosis: No Has patient had a PCN reaction that required hospitalization No Has patient had a PCN reaction occurring within the last 10 years: No If all of the above answers are "NO", then may proceed with Cephalosporin use.    Levocetirizine Other (See Comments)    Muscle cramps    Family History  Problem Relation Age of Onset   Emphysema Mother    Allergies Mother        "everyone in family"   Asthma Mother        "everyone in family"   Heart disease Mother    Clotting disorder Mother    Cancer Mother    Emphysema Father    Allergies Father    Allergies Son    Seizures Brother  Diabetes Brother    Prior to Admission medications   Medication Sig Start Date End Date Taking? Authorizing Provider  albuterol (PROAIR HFA) 108 (90 Base) MCG/ACT inhaler Inhale 2 puffs into the lungs every 4 (four) hours as needed. 03/19/21   Fredia Sorrow, MD  albuterol (PROVENTIL) (2.5 MG/3ML) 0.083% nebulizer solution Take 3 mLs (2.5 mg total) by nebulization every 4 (four) hours as needed for wheezing or shortness of breath.  04/28/18   [name], NP  atorvastatin (LIPITOR) 40 MG tablet TAKE ONE TABLET BY MOUTH DAILY Patient taking differently: Take 40 mg by mouth at bedtime. 07/06/20   [name], MD  benztropine (COGENTIN) 0.5 MG tablet Take 0.5 mg by mouth at bedtime.     [provider]  budesonide-formoterol (SYMBICORT) 160-4.5 MCG/ACT inhaler INHALE TWO PUFFS BY MOUTH INTO THE LUNGS TWICE A DAY 03/19/21   Fredia Sorrow, MD  cetirizine (ZYRTEC) 10 MG tablet Take 10 mg by mouth daily.    [provider]  diclofenac Sodium (VOLTAREN) 1 % GEL Apply 2 g topically 4 (four) times daily.    [provider]  dorzolamide (TRUSOPT) 2 % ophthalmic solution Place 1 drop into the left eye 2 (two) times daily. 12/12/20   [provider]  famotidine (PEPCID) 20 MG tablet TAKE 1 TABLET (20 MG TOTAL) BY MOUTH AT BEDTIME. 02/05/21   [name], MD  fluticasone (FLONASE) 50 MCG/ACT nasal spray Place 2 sprays into both nostrils 3 (three) times daily as needed for allergies.     [provider]  furosemide (LASIX) 20 MG tablet Take 1 tablet (20 mg total) by mouth daily. 05/01/21   [name], MD  hydrochlorothiazide (MICROZIDE) 12.5 MG capsule Take 1 capsule (12.5 mg total) by mouth every morning. 01/29/21   Tolia, Sunit, DO  latanoprost (XALATAN) 0.005 % ophthalmic solution Place 1 drop into both eyes at bedtime. 05/24/19   [provider]  levofloxacin (LEVAQUIN) 500 MG tablet Take 1 tablet (500 mg total) by mouth daily. Patient not taking: Reported on 04/12/2021 04/01/21   Varney Biles, MD  LORazepam (ATIVAN) 1 MG tablet Take 1 tablet (1 mg total) by mouth 3 (three) times daily as needed for anxiety. 02/15/21   Daleen Bo, MD  losartan (COZAAR) 100 MG tablet Take 100 mg by mouth daily.    [provider]  loxapine (LOXITANE) 10 MG capsule Take 10 mg by mouth at bedtime.     [provider]  megestrol (MEGACE) 40 MG tablet Take 40 mg by mouth  daily.    [provider]  mirtazapine (REMERON) 30 MG tablet Take 30 mg by mouth at bedtime.    [provider]  Multiple Vitamin (MULTIVITAMIN) capsule Take 1 capsule by mouth daily.    [provider]  mupirocin cream (BACTROBAN) 2 % Apply 1 application topically 2 (two) times daily. 05/01/21   [name], MD  OXYGEN Inhale 3-4 L into the lungs as directed. 3-4 liters with sleep and exertion as needed for low oxygen    [provider]  pantoprazole (PROTONIX) 40 MG tablet Take 40 mg by mouth daily.    [provider]  polyethylene glycol (MIRALAX / GLYCOLAX) 17 g packet TAKE 17 GRAMS BY MOUTH DAILY 03/09/20   [name], MD  potassium chloride SA (KLOR-CON) 20 MEQ tablet Take 1 tablet (20 mEq total) by mouth 2 (two) times daily. Patient not taking: No sig reported 02/15/21   Daleen Bo, MD  predniSONE (DELTASONE) 10 MG tablet Take 2 tablets (20 mg total) by mouth daily. 04/12/21   [name], MD  risperiDONE (RISPERDAL) 2 MG tablet Take 2 mg by mouth at bedtime.    [provider]  Tiotropium Bromide Monohydrate (SPIRIVA RESPIMAT) 2.5 MCG/ACT AERS Inhale 2 puffs into the lungs daily. 03/19/21 04/18/21  Fredia Sorrow, MD    Physical Exam: Vitals:   05/05/21 0330 05/05/21 0345 05/05/21 0400 05/05/21 0415  BP: 135/88  130/89   Pulse: 78 78 82 85  Resp: 18 (!) 21 (!) 22 (!) 22  Temp:      TempSrc:      SpO2: 94% 94% 97% 98%  Weight:      Height:       Constitutional: NAD, calm, comfortable Eyes: PERRL, lids and conjunctivae normal ENMT: Mucous membranes are moist. Posterior pharynx clear of any exudate or lesions.Normal dentition.  Neck: normal, supple, no masses, no thyromegaly Respiratory: clear to auscultation bilaterally, no wheezing, no crackles. Normal respiratory effort. No accessory muscle use.  Cardiovascular: Regular rate and rhythm, no murmurs / rubs / gallops. No extremity edema. 2+ pedal pulses. No carotid  bruits.  Abdomen: no tenderness, no masses palpated. No hepatosplenomegaly. Bowel sounds positive.  Musculoskeletal: no clubbing / cyanosis. No joint deformity upper and lower extremities. Good ROM, no contractures. Normal muscle tone.  Skin: no rashes, lesions, ulcers. No induration Neurologic: CN 2-12 grossly intact. Sensation intact, DTR normal. Strength 5/5 in all 4.  Psychiatric: Normal judgment and insight. Alert and oriented x 3. Normal mood.   Labs on Admission: I have personally reviewed following labs and imaging studies  CBC: Recent Labs  Lab 05/05/21 0229 05/05/21 0244  WBC 14.8*  --   NEUTROABS 10.0*  --   HGB 14.1 15.0  HCT 44.1 44.0  MCV 100.0  --   PLT 254  --     Basic Metabolic Panel: Recent Labs  Lab 05/05/21 0229 05/05/21 0244  NA 138 141  K 3.2* 3.3*  CL 102 103  CO2 29  --   GLUCOSE 95 93  BUN 19 17  CREATININE 1.01 1.10  CALCIUM 8.7*  --     GFR: Estimated Creatinine Clearance: 76.1 mL/min (by C-G formula based on SCr of 1.1 mg/dL).  Liver Function Tests: Recent Labs  Lab 05/05/21 0229  AST 25  ALT 27  ALKPHOS 60  BILITOT 1.1  PROT 6.5  ALBUMIN 3.7   Radiological Exams on Admission: CT HEAD WO CONTRAST  Result Date: 05/05/2021 CLINICAL DATA:  Headache and left shoulder pain. Pins and needles sensation in the left hand. Acute stroke suspected. EXAM: CT HEAD WITHOUT CONTRAST TECHNIQUE: Contiguous axial images were obtained from the base of the skull through the vertex without intravenous contrast. COMPARISON:  03/20/2009 FINDINGS: Brain: No evidence of acute infarction, hemorrhage, hydrocephalus, extra-axial collection or mass lesion/mass effect. Vascular: No hyperdense vessel or unexpected calcification. Skull: Normal. Negative for fracture or focal lesion. Sinuses/Orbits: No acute finding. IMPRESSION: Negative head CT. Electronically Signed   By: Monte Fantasia M.D.   On: 05/05/2021 05:30    05/23/2020  IMPRESSIONS    1. Left  ventricular ejection fraction, by estimation, is 50%. The left  ventricle has low normal function. The left ventricle has no regional wall  motion abnormalities. There is mild left ventricular hypertrophy. Left  ventricular diastolic parameters are   consistent with Grade I diastolic dysfunction (impaired relaxation).   2. Right ventricular systolic function is normal. The right  ventricular  size is normal. Tricuspid regurgitation signal is inadequate for assessing  PA pressure.   3. The mitral valve is grossly normal. Trivial mitral valve  regurgitation.   4. The aortic valve is tricuspid. Aortic valve regurgitation is not  visualized.   5. The inferior vena cava is normal in size with greater than 50%  respiratory variability, suggesting right atrial pressure of 3 mmHg.   EKG: Independently reviewed.  Vent. rate 86 BPM PR interval 157 ms QRS duration 87 ms QT/QTcB 370/443 ms P-R-T axes 78 -78 71 Sinus rhythm Left anterior fascicular block Consider anterior infarct  Assessment/Plan Principal Problem:   Left-sided weakness Observation/telemetry. Frequent neurochecks. Swallow screen. PT/OT/SLP if needed. Check fasting lipids and hemoglobin A1c. Check carotid Doppler and echocardiogram. Check MRI of brain without contrast.  Active Problems:   COPD  GOLD III   Chronic respiratory failure with hypoxia (HCC) Continue supplemental oxygen. Continue Symbicort twice daily. Nebulized beta agonist as needed.    Essential hypertension Hold antihypertensives. Allow permissive hypertension.    GERD (gastroesophageal reflux disease) Continue famotidine 20 mg p.o. at bedtime.    Hyperlipidemia Check lipid panel. Continue atorvastatin 40 mg p.o. daily.    Hypokalemia Supplementing. Follow potassium level.    Leukocytosis Takes prednisone 20 mg p.o. daily.   DVT prophylaxis: Lovenox SQ. Code Status:   Full code. Family Communication:   Disposition Plan:   Patient is  from:  Home.  Anticipated DC to:  Home.   Anticipated DC date:   05/06/2021.  Anticipated DC barriers: Pending work-up and consultant sign off. Consults called:  Seen by tele-neurology. Admission status:  Observation/telemetry.   Severity of Illness: High severity due to presenting with strokelike symptoms.  The patient will need to undergo CVA work-up.  Reubin Milan MD [facility]  How to contact the [facility] Attending or Consulting provider [place] or covering provider during after hours [place], for this patient?   Check the care team in [facility] and look for a) attending/consulting TRH provider listed and b) the [facility] team listed Log into [email or web address] and use [place]'s universal password to access. If you do not have the password, please contact the hospital operator. Locate the [facility] provider you are looking for under [facility] and page to a number that you can be directly reached. If you still have difficulty reaching the provider, please page the [facility] (Director on Call) for the Hospitalists listed on amion for assistance.  05/05/2021, 6:30 AM   This document was prepared using Dragon voice recognition software may contain some unintended transcription errors.

## 2021-05-05 NOTE — ED Notes (Signed)
Pt returned from CT °

## 2021-05-06 DIAGNOSIS — E876 Hypokalemia: Secondary | ICD-10-CM | POA: Diagnosis not present

## 2021-05-06 DIAGNOSIS — I10 Essential (primary) hypertension: Secondary | ICD-10-CM

## 2021-05-06 DIAGNOSIS — R202 Paresthesia of skin: Secondary | ICD-10-CM

## 2021-05-06 DIAGNOSIS — J9611 Chronic respiratory failure with hypoxia: Secondary | ICD-10-CM | POA: Diagnosis not present

## 2021-05-06 DIAGNOSIS — R200 Anesthesia of skin: Secondary | ICD-10-CM | POA: Diagnosis not present

## 2021-05-06 LAB — COMPREHENSIVE METABOLIC PANEL
ALT: 23 U/L (ref 0–44)
AST: 22 U/L (ref 15–41)
Albumin: 2.8 g/dL — ABNORMAL LOW (ref 3.5–5.0)
Alkaline Phosphatase: 53 U/L (ref 38–126)
Anion gap: 7 (ref 5–15)
BUN: 19 mg/dL (ref 6–20)
CO2: 30 mmol/L (ref 22–32)
Calcium: 9.2 mg/dL (ref 8.9–10.3)
Chloride: 103 mmol/L (ref 98–111)
Creatinine, Ser: 1.02 mg/dL (ref 0.61–1.24)
GFR, Estimated: >60 mL/min (ref >60)
Glucose, Bld: 125 mg/dL — ABNORMAL HIGH (ref 70–99)
Potassium: 3.9 mmol/L (ref 3.5–5.1)
Sodium: 140 mmol/L (ref 135–145)
Total Bilirubin: 0.6 mg/dL (ref 0.3–1.2)
Total Protein: 5.2 g/dL — ABNORMAL LOW (ref 6.5–8.1)

## 2021-05-06 LAB — MAGNESIUM: Magnesium: 2.1 mg/dL (ref 1.7–2.4)

## 2021-05-06 MED ORDER — POTASSIUM CHLORIDE CRYS ER 20 MEQ PO TBCR
40.0000 meq | EXTENDED_RELEASE_TABLET | ORAL | Status: DC
  Administered 2021-05-06: 40 meq via ORAL
  Filled 2021-05-06: qty 2

## 2021-05-06 MED ORDER — GADOBUTROL 1 MMOL/ML IV SOLN
7.5000 mL | Freq: Once | INTRAVENOUS | Status: AC | PRN
  Administered 2021-05-06: 7.5 mL via INTRAVENOUS

## 2021-05-06 MED ORDER — ALBUTEROL SULFATE (2.5 MG/3ML) 0.083% IN NEBU
2.5000 mg | INHALATION_SOLUTION | RESPIRATORY_TRACT | Status: DC | PRN
  Administered 2021-05-06: 2.5 mg via RESPIRATORY_TRACT
  Filled 2021-05-06: qty 3

## 2021-05-06 NOTE — Discharge Summary (Signed)
Physician Discharge Summary  JETTSON CRABLE WJX:[id number] DOB: December 24, 1960 DOA: 05/05/2021  PCP: [name], FNP  Admit date: 05/05/2021 Discharge date: 05/06/2021  Admitted From: Home Disposition: Home  Recommendations for Outpatient Follow-up:  Follow-up for recurrence of numbness and tingling in left arm  Home Health: Ordered Discharge Condition:  stable   CODE STATUS:  full code   Diet recommendation:  heart healthy Consultations: Neuro- tele  Procedures/Studies: See below   Discharge Diagnoses:  Principal Problem:   Numbness and tingling in left arm Active Problems:   Severe headache   COPD  GOLD III   Smoker   Chronic respiratory failure with hypoxia ([place])   Essential hypertension   GERD (gastroesophageal reflux disease)   Hyperlipidemia   Hypokalemia   Leukocytosis     HPI Per Dr. Olevia Bowens: [name] is a 60 y.o. male with medical history significant of anxiety, osteoarthritis, COPD on home oxygen, depression, GERD, hypertension, prediabetes, seasonal allergies, tobacco and cocaine use, history of cervical fusion who is coming to the emergency department with complaints of left-sided tingling and weakness associated with frontal headache that started about 3 hours before arriving to the emergency department.    Hospital Course:  Numbness and tingling in left arm and headache -NIH score of 1-neurology was consulted in ED and recommended an MRI, lipid panel, hemoglobin A1c and aspirin 81 mg daily - Has resolved - MRI of brain negative for an etiology-she has mild chronic microvascular ischemic changes for age -MRA head and neck negative for stenosis or occlusion -2D echo shows an EF of 60 to 21%, grade 1 diastolic dysfunction, no cardiac thrombus -LDL is 58 - Hemoglobin A1c is 6.1 - The patient states that it is most likely related to the plate that he has in his cervical spine (which he has been told is loose) she states that Dr. Christella Noa has ordered an  outpatient MRI for him  Generalized lower extremity weakness - He states this is due to multiple back injuries - Physical and Occupational Therapy have recommended SNF but he states that he prefers to go home with home health-I have consulted [facility] to arrange the home health - The patient lives alone but has an aide who comes in and helps him  COPD  -Chronically on 20 mg of prednisone-see inhalers noted below-no flareup noted  Chronic pedal edema - States he spends mostly of the day in his electronic wheelchair mainly because he gets very short of breath when moving around-this likely causes dependent edema-it appears he is on Lasix and HCTZ for this-I have reminded him to keep his legs elevated  Discharge Exam: Vitals:   05/05/21 2030 05/06/21 1306  BP: (!) 136/104 137/82  Pulse: (!) 53 72  Resp: 17 15  Temp:  98.3 F (36.8 C)  SpO2: 96% 97%   Vitals:   05/05/21 1954 05/05/21 2030 05/05/21 2142 05/06/21 1306  BP:  (!) 136/104  137/82  Pulse:  (!) 53  72  Resp:  17  15  Temp:    98.3 F (36.8 C)  TempSrc:      SpO2: 95% 96%  97%  Weight:   75.9 kg   Height:        General: Pt is alert, awake, not in acute distress Cardiovascular: RRR, S1/S2 +, no rubs, no gallops Respiratory: CTA bilaterally, no wheezing, no rhonchi Abdominal: Soft, NT, ND, bowel sounds + Extremities: no edema, no cyanosis   Discharge Instructions  Discharge Instructions  Diet - low sodium heart healthy   Complete by: As directed    Increase activity slowly   Complete by: As directed       Allergies as of 05/06/2021       Reactions   Penicillins Anaphylaxis   Has patient had a PCN reaction causing immediate rash, facial/tongue/throat swelling, SOB or lightheadedness with hypotension: Yes Has patient had a PCN reaction causing severe rash involving mucus membranes or skin necrosis: No Has patient had a PCN reaction that required hospitalization No Has patient had a PCN reaction occurring  within the last 10 years: No If all of the above answers are "NO", then may proceed with Cephalosporin use.   Levocetirizine Other (See Comments)   Muscle cramps        Medication List     TAKE these medications    albuterol (2.5 MG/3ML) 0.083% nebulizer solution Commonly known as: PROVENTIL Take 3 mLs (2.5 mg total) by nebulization every 4 (four) hours as needed for wheezing or shortness of breath.   albuterol 108 (90 Base) MCG/ACT inhaler Commonly known as: ProAir HFA Inhale 2 puffs into the lungs every 4 (four) hours as needed.   atorvastatin 40 MG tablet Commonly known as: LIPITOR TAKE ONE TABLET BY MOUTH DAILY What changed: when to take this   benztropine 0.5 MG tablet Commonly known as: COGENTIN Take 0.5 mg by mouth at bedtime.   budesonide-formoterol 160-4.5 MCG/ACT inhaler Commonly known as: Symbicort INHALE TWO PUFFS BY MOUTH INTO THE LUNGS TWICE A DAY   cetirizine 10 MG tablet Commonly known as: ZYRTEC Take 10 mg by mouth daily.   diclofenac Sodium 1 % Gel Commonly known as: VOLTAREN Apply 2 g topically 4 (four) times daily.   dorzolamide 2 % ophthalmic solution Commonly known as: TRUSOPT Place 1 drop into the left eye 2 (two) times daily.   famotidine 20 MG tablet Commonly known as: PEPCID TAKE 1 TABLET (20 MG TOTAL) BY MOUTH AT BEDTIME.   fluticasone 50 MCG/ACT nasal spray Commonly known as: FLONASE Place 2 sprays into both nostrils 3 (three) times daily as needed for allergies.   furosemide 20 MG tablet Commonly known as: LASIX Take 1 tablet (20 mg total) by mouth daily.   hydrochlorothiazide 12.5 MG capsule Commonly known as: MICROZIDE Take 1 capsule (12.5 mg total) by mouth every morning.   latanoprost 0.005 % ophthalmic solution Commonly known as: XALATAN Place 1 drop into both eyes at bedtime.   LORazepam 1 MG tablet Commonly known as: Ativan Take 1 tablet (1 mg total) by mouth 3 (three) times daily as needed for anxiety.    losartan 100 MG tablet Commonly known as: COZAAR Take 100 mg by mouth daily.   loxapine 10 MG capsule Commonly known as: LOXITANE Take 10 mg by mouth at bedtime.   megestrol 40 MG tablet Commonly known as: MEGACE Take 40 mg by mouth daily.   mirtazapine 30 MG tablet Commonly known as: REMERON Take 30 mg by mouth at bedtime.   multivitamin capsule Take 1 capsule by mouth daily.   mupirocin cream 2 % Commonly known as: BACTROBAN Apply 1 application topically 2 (two) times daily.   OXYGEN Inhale 3-4 L into the lungs as directed. 3-4 liters with sleep and exertion as needed for low oxygen   pantoprazole 40 MG tablet Commonly known as: PROTONIX Take 40 mg by mouth daily.   polyethylene glycol 17 g packet Commonly known as: MIRALAX / GLYCOLAX TAKE 17 GRAMS BY MOUTH DAILY  potassium chloride SA 20 MEQ tablet Commonly known as: KLOR-CON Take 1 tablet (20 mEq total) by mouth 2 (two) times daily.   predniSONE 10 MG tablet Commonly known as: DELTASONE Take 2 tablets (20 mg total) by mouth daily.   risperiDONE 2 MG tablet Commonly known as: RISPERDAL Take 2 mg by mouth at bedtime.   Spiriva Respimat 2.5 MCG/ACT Aers Generic drug: Tiotropium Bromide Monohydrate Inhale 2 puffs into the lungs daily.        Allergies  Allergen Reactions   Penicillins Anaphylaxis    Has patient had a PCN reaction causing immediate rash, facial/tongue/throat swelling, SOB or lightheadedness with hypotension: Yes Has patient had a PCN reaction causing severe rash involving mucus membranes or skin necrosis: No Has patient had a PCN reaction that required hospitalization No Has patient had a PCN reaction occurring within the last 10 years: No If all of the above answers are "NO", then may proceed with Cephalosporin use.    Levocetirizine Other (See Comments)    Muscle cramps      CT HEAD WO CONTRAST  Result Date: 05/05/2021 CLINICAL DATA:  Headache and left shoulder pain. Pins and  needles sensation in the left hand. Acute stroke suspected. EXAM: CT HEAD WITHOUT CONTRAST TECHNIQUE: Contiguous axial images were obtained from the base of the skull through the vertex without intravenous contrast. COMPARISON:  03/20/2009 FINDINGS: Brain: No evidence of acute infarction, hemorrhage, hydrocephalus, extra-axial collection or mass lesion/mass effect. Vascular: No hyperdense vessel or unexpected calcification. Skull: Normal. Negative for fracture or focal lesion. Sinuses/Orbits: No acute finding. IMPRESSION: Negative head CT. Electronically Signed   By: Monte Fantasia M.D.   On: 05/05/2021 05:30   MR ANGIO HEAD WO CONTRAST  Result Date: 05/06/2021 CLINICAL DATA:  Initial evaluation for neuro deficit, stroke suspected. EXAM: MRI HEAD WITHOUT CONTRAST MRA HEAD WITHOUT CONTRAST MRA NECK WITHOUT AND WITH CONTRAST TECHNIQUE: Multiplanar, multi-echo pulse sequences of the brain and surrounding structures were acquired without intravenous contrast. Angiographic images of the Circle of Willis were acquired using MRA technique without intravenous contrast. Angiographic images of the neck were acquired using MRA technique without and with intravenous contrast. Carotid stenosis measurements (when applicable) are obtained utilizing NASCET criteria, using the distal internal carotid diameter as the denominator. CONTRAST:  7.75mL GADAVIST GADOBUTROL 1 MMOL/ML IV SOLN COMPARISON:  Prior CT from 05/05/2021. FINDINGS: MRI HEAD FINDINGS Brain: Cerebral volume within normal limits for age. Patchy T2/FLAIR hyperintensity seen involving the periventricular and deep white matter both cerebral hemispheres, most like related chronic microvascular ischemic disease, mild in nature. No abnormal foci of restricted diffusion to suggest acute or subacute ischemia. Gray-white matter differentiation maintained. No encephalomalacia to suggest chronic cortical infarction. No evidence for acute or chronic intracranial hemorrhage. No  mass lesion, midline shift, or mass effect. No hydrocephalus or extra-axial fluid collection. Pituitary gland and suprasellar region within normal limits. Midline structures intact and normal. Vascular: Major intracranial vascular flow voids are maintained. Skull and upper cervical spine: Craniocervical junction within normal limits. Bone marrow signal intensity normal. No scalp soft tissue abnormality. Sinuses/Orbits: Globes and orbital soft tissues demonstrate no acute finding. Sequelae of prior bilateral ocular lens replacement. Mild scattered mucosal thickening noted within the ethmoidal air cells and maxillary sinuses. No significant mastoid effusion. Inner ear structures grossly normal. Other: None. MRA HEAD FINDINGS Anterior circulation: Distal cervical segments of the internal carotid arteries are patent with antegrade flow. Petrous, cavernous, and supraclinoid segments widely patent without stenosis. Subtle outpouching at the supraclinoid  left ICA favored to reflect a vascular infundibulum related to a hypoplastic left posterior communicating artery. A1 segments patent bilaterally. Normal anterior communicating artery complex. Anterior cerebral arteries grossly patent to their distal aspects without definite stenosis. No M1 stenosis or occlusion. Normal MCA bifurcations. Distal MCA branches well perfused and symmetric. Diffuse small vessel atheromatous irregularity noted. Posterior circulation: Both V4 segments patent to the vertebrobasilar junction without stenosis. Right vertebral artery slightly dominant. Both PICA origins patent. Basilar patent to its distal aspect without stenosis. Superior cerebellar arteries patent bilaterally. Both PCAs primarily supplied via the basilar well perfused or distal aspects. Anatomic variants: None significant.  No aneurysm. MRA NECK FINDINGS Aortic arch: Visualized aortic arch of normal caliber with normal branch pattern. No stenosis or other abnormality about the  origin of the great vessels. Right carotid system: Right common and internal carotid arteries widely patent without stenosis, evidence for dissection or occlusion. Left carotid system: Left common and internal carotid arteries widely patent without stenosis, evidence for dissection or occlusion. Vertebral arteries: Both vertebral arteries arise from the subclavian arteries. No proximal subclavian artery stenosis. Both vertebral arteries widely patent without stenosis, evidence for dissection or occlusion. Other: None IMPRESSION: MRI HEAD IMPRESSION: 1. No acute intracranial abnormality. 2. Mild chronic microvascular ischemic disease for age. Otherwise unremarkable brain MRI. MRA HEAD IMPRESSION: 1. Negative intracranial MRA with no large vessel occlusion identified. No hemodynamically significant or correctable stenosis. 2. Mild distal small vessel atheromatous irregularity. MRA NECK IMPRESSION: Normal MRA of the neck. Electronically Signed   By: Jeannine Boga M.D.   On: 05/06/2021 03:03   MR ANGIO NECK W WO CONTRAST  Result Date: 05/06/2021 CLINICAL DATA:  Initial evaluation for neuro deficit, stroke suspected. EXAM: MRI HEAD WITHOUT CONTRAST MRA HEAD WITHOUT CONTRAST MRA NECK WITHOUT AND WITH CONTRAST TECHNIQUE: Multiplanar, multi-echo pulse sequences of the brain and surrounding structures were acquired without intravenous contrast. Angiographic images of the Circle of Willis were acquired using MRA technique without intravenous contrast. Angiographic images of the neck were acquired using MRA technique without and with intravenous contrast. Carotid stenosis measurements (when applicable) are obtained utilizing NASCET criteria, using the distal internal carotid diameter as the denominator. CONTRAST:  7.34mL GADAVIST GADOBUTROL 1 MMOL/ML IV SOLN COMPARISON:  Prior CT from 05/05/2021. FINDINGS: MRI HEAD FINDINGS Brain: Cerebral volume within normal limits for age. Patchy T2/FLAIR hyperintensity seen  involving the periventricular and deep white matter both cerebral hemispheres, most like related chronic microvascular ischemic disease, mild in nature. No abnormal foci of restricted diffusion to suggest acute or subacute ischemia. Gray-white matter differentiation maintained. No encephalomalacia to suggest chronic cortical infarction. No evidence for acute or chronic intracranial hemorrhage. No mass lesion, midline shift, or mass effect. No hydrocephalus or extra-axial fluid collection. Pituitary gland and suprasellar region within normal limits. Midline structures intact and normal. Vascular: Major intracranial vascular flow voids are maintained. Skull and upper cervical spine: Craniocervical junction within normal limits. Bone marrow signal intensity normal. No scalp soft tissue abnormality. Sinuses/Orbits: Globes and orbital soft tissues demonstrate no acute finding. Sequelae of prior bilateral ocular lens replacement. Mild scattered mucosal thickening noted within the ethmoidal air cells and maxillary sinuses. No significant mastoid effusion. Inner ear structures grossly normal. Other: None. MRA HEAD FINDINGS Anterior circulation: Distal cervical segments of the internal carotid arteries are patent with antegrade flow. Petrous, cavernous, and supraclinoid segments widely patent without stenosis. Subtle outpouching at the supraclinoid left ICA favored to reflect a vascular infundibulum related to a hypoplastic left posterior  communicating artery. A1 segments patent bilaterally. Normal anterior communicating artery complex. Anterior cerebral arteries grossly patent to their distal aspects without definite stenosis. No M1 stenosis or occlusion. Normal MCA bifurcations. Distal MCA branches well perfused and symmetric. Diffuse small vessel atheromatous irregularity noted. Posterior circulation: Both V4 segments patent to the vertebrobasilar junction without stenosis. Right vertebral artery slightly dominant. Both  PICA origins patent. Basilar patent to its distal aspect without stenosis. Superior cerebellar arteries patent bilaterally. Both PCAs primarily supplied via the basilar well perfused or distal aspects. Anatomic variants: None significant.  No aneurysm. MRA NECK FINDINGS Aortic arch: Visualized aortic arch of normal caliber with normal branch pattern. No stenosis or other abnormality about the origin of the great vessels. Right carotid system: Right common and internal carotid arteries widely patent without stenosis, evidence for dissection or occlusion. Left carotid system: Left common and internal carotid arteries widely patent without stenosis, evidence for dissection or occlusion. Vertebral arteries: Both vertebral arteries arise from the subclavian arteries. No proximal subclavian artery stenosis. Both vertebral arteries widely patent without stenosis, evidence for dissection or occlusion. Other: None IMPRESSION: MRI HEAD IMPRESSION: 1. No acute intracranial abnormality. 2. Mild chronic microvascular ischemic disease for age. Otherwise unremarkable brain MRI. MRA HEAD IMPRESSION: 1. Negative intracranial MRA with no large vessel occlusion identified. No hemodynamically significant or correctable stenosis. 2. Mild distal small vessel atheromatous irregularity. MRA NECK IMPRESSION: Normal MRA of the neck. Electronically Signed   By: Jeannine Boga M.D.   On: 05/06/2021 03:03   MR BRAIN WO CONTRAST  Result Date: 05/06/2021 CLINICAL DATA:  Initial evaluation for neuro deficit, stroke suspected. EXAM: MRI HEAD WITHOUT CONTRAST MRA HEAD WITHOUT CONTRAST MRA NECK WITHOUT AND WITH CONTRAST TECHNIQUE: Multiplanar, multi-echo pulse sequences of the brain and surrounding structures were acquired without intravenous contrast. Angiographic images of the Circle of Willis were acquired using MRA technique without intravenous contrast. Angiographic images of the neck were acquired using MRA technique without and with  intravenous contrast. Carotid stenosis measurements (when applicable) are obtained utilizing NASCET criteria, using the distal internal carotid diameter as the denominator. CONTRAST:  7.64mL GADAVIST GADOBUTROL 1 MMOL/ML IV SOLN COMPARISON:  Prior CT from 05/05/2021. FINDINGS: MRI HEAD FINDINGS Brain: Cerebral volume within normal limits for age. Patchy T2/FLAIR hyperintensity seen involving the periventricular and deep white matter both cerebral hemispheres, most like related chronic microvascular ischemic disease, mild in nature. No abnormal foci of restricted diffusion to suggest acute or subacute ischemia. Gray-white matter differentiation maintained. No encephalomalacia to suggest chronic cortical infarction. No evidence for acute or chronic intracranial hemorrhage. No mass lesion, midline shift, or mass effect. No hydrocephalus or extra-axial fluid collection. Pituitary gland and suprasellar region within normal limits. Midline structures intact and normal. Vascular: Major intracranial vascular flow voids are maintained. Skull and upper cervical spine: Craniocervical junction within normal limits. Bone marrow signal intensity normal. No scalp soft tissue abnormality. Sinuses/Orbits: Globes and orbital soft tissues demonstrate no acute finding. Sequelae of prior bilateral ocular lens replacement. Mild scattered mucosal thickening noted within the ethmoidal air cells and maxillary sinuses. No significant mastoid effusion. Inner ear structures grossly normal. Other: None. MRA HEAD FINDINGS Anterior circulation: Distal cervical segments of the internal carotid arteries are patent with antegrade flow. Petrous, cavernous, and supraclinoid segments widely patent without stenosis. Subtle outpouching at the supraclinoid left ICA favored to reflect a vascular infundibulum related to a hypoplastic left posterior communicating artery. A1 segments patent bilaterally. Normal anterior communicating artery complex. Anterior  cerebral arteries grossly  patent to their distal aspects without definite stenosis. No M1 stenosis or occlusion. Normal MCA bifurcations. Distal MCA branches well perfused and symmetric. Diffuse small vessel atheromatous irregularity noted. Posterior circulation: Both V4 segments patent to the vertebrobasilar junction without stenosis. Right vertebral artery slightly dominant. Both PICA origins patent. Basilar patent to its distal aspect without stenosis. Superior cerebellar arteries patent bilaterally. Both PCAs primarily supplied via the basilar well perfused or distal aspects. Anatomic variants: None significant.  No aneurysm. MRA NECK FINDINGS Aortic arch: Visualized aortic arch of normal caliber with normal branch pattern. No stenosis or other abnormality about the origin of the great vessels. Right carotid system: Right common and internal carotid arteries widely patent without stenosis, evidence for dissection or occlusion. Left carotid system: Left common and internal carotid arteries widely patent without stenosis, evidence for dissection or occlusion. Vertebral arteries: Both vertebral arteries arise from the subclavian arteries. No proximal subclavian artery stenosis. Both vertebral arteries widely patent without stenosis, evidence for dissection or occlusion. Other: None IMPRESSION: MRI HEAD IMPRESSION: 1. No acute intracranial abnormality. 2. Mild chronic microvascular ischemic disease for age. Otherwise unremarkable brain MRI. MRA HEAD IMPRESSION: 1. Negative intracranial MRA with no large vessel occlusion identified. No hemodynamically significant or correctable stenosis. 2. Mild distal small vessel atheromatous irregularity. MRA NECK IMPRESSION: Normal MRA of the neck. Electronically Signed   By: Jeannine Boga M.D.   On: 05/06/2021 03:03   DG Chest Port 1 View  Result Date: 04/12/2021 CLINICAL DATA:  60 year old male with increase shortness of breath. EXAM: PORTABLE CHEST 1 VIEW  COMPARISON:  Portable chest 03/31/2021 and earlier. FINDINGS: Portable AP upright view at 0919 hours. Stable large lung volumes, coarse pulmonary interstitial opacity and attenuation of upper lobe bronchovascular markings in keeping with the centrilobular emphysema demonstrated by CT in 2019. Mediastinal contours remain normal. Visualized tracheal air column is within normal limits. No pneumothorax or acute pulmonary opacity. Partially visible ACDF. No acute osseous abnormality identified. IMPRESSION: Emphysema (ICD10-J43.9). No acute cardiopulmonary abnormality. Electronically Signed   By: Genevie Ann M.D.   On: 04/12/2021 09:31   ECHOCARDIOGRAM COMPLETE  Result Date: 05/05/2021    ECHOCARDIOGRAM REPORT   Patient Name:   FARRELL PANTALEO Samons Date of Exam: 05/05/2021 Medical Rec #:  [id number]      Height:       71.0 in Accession #:    [id number]     Weight:       160.1 lb Date of Birth:  02/06/1961       BSA:          1.918 m Patient Age:    60 years       BP:           135/86 mmHg Patient Gender: M              HR:           71 bpm. Exam Location:  [facility] Procedure: 2D Echo, Cardiac Doppler and Color Doppler Indications:    TIA G45.9  History:        Patient has prior history of Echocardiogram examinations, most                 recent 05/23/2020. COPD; Risk Factors:Hypertension, Diabetes,                 Current Smoker and Dyslipidemia. ETOH abuse.  Sonographer:    Alvino Chapel RCS Referring Phys: [id number] [place]  1. Left ventricular ejection  fraction, by estimation, is 60 to 65%. The left ventricle has normal function. The left ventricle has no regional wall motion abnormalities. Left ventricular diastolic parameters are consistent with Grade I diastolic dysfunction (impaired relaxation).  2. Right ventricular systolic function is normal. The right ventricular size is normal. Tricuspid regurgitation signal is inadequate for assessing PA pressure.  3. The mitral valve is grossly normal. No evidence  of mitral valve regurgitation. No evidence of mitral stenosis.  4. The aortic valve was not well visualized. Aortic valve regurgitation is not visualized. No aortic stenosis is present.  5. The inferior vena cava is normal in size with greater than 50% respiratory variability, suggesting right atrial pressure of 3 mmHg. Comparison(s): A prior study was performed on 05/23/20. Prior images reviewed side by side. LV function is more vigorous. FINDINGS  Left Ventricle: Left ventricular ejection fraction, by estimation, is 60 to 65%. The left ventricle has normal function. The left ventricle has no regional wall motion abnormalities. The left ventricular internal cavity size was normal in size. There is  no left ventricular hypertrophy. Left ventricular diastolic parameters are consistent with Grade I diastolic dysfunction (impaired relaxation). Right Ventricle: The right ventricular size is normal. No increase in right ventricular wall thickness. Right ventricular systolic function is normal. Tricuspid regurgitation signal is inadequate for assessing PA pressure. Left Atrium: Left atrial size was normal in size. Right Atrium: Right atrial size was normal in size. Pericardium: There is no evidence of pericardial effusion. Mitral Valve: The mitral valve is grossly normal. Mild mitral annular calcification. No evidence of mitral valve regurgitation. No evidence of mitral valve stenosis. Tricuspid Valve: The tricuspid valve is normal in structure. Tricuspid valve regurgitation is not demonstrated. No evidence of tricuspid stenosis. Aortic Valve: The aortic valve was not well visualized. Aortic valve regurgitation is not visualized. No aortic stenosis is present. Pulmonic Valve: The pulmonic valve was normal in structure. Pulmonic valve regurgitation is not visualized. No evidence of pulmonic stenosis. Aorta: The aortic root is normal in size and structure. Pulmonary Artery: The pulmonary artery is of normal size. Venous: The  inferior vena cava is normal in size with greater than 50% respiratory variability, suggesting right atrial pressure of 3 mmHg. IAS/Shunts: The atrial septum is grossly normal.  LEFT VENTRICLE PLAX 2D LVIDd:         5.00 cm  Diastology LVIDs:         2.20 cm  LV e' medial:    8.27 cm/s LV PW:         1.00 cm  LV E/e' medial:  11.0 LV IVS:        1.00 cm  LV e' lateral:   8.16 cm/s LVOT diam:     2.00 cm  LV E/e' lateral: 11.2 LV SV:         77 LV SV Index:   40 LVOT Area:     3.14 cm  RIGHT VENTRICLE RV S prime:     10.80 cm/s TAPSE (M-mode): 1.8 cm LEFT ATRIUM             Index       RIGHT ATRIUM           Index LA diam:        3.10 cm 1.62 cm/m  RA Area:     15.90 cm LA Vol (A2C):   32.9 ml 17.15 ml/m RA Volume:   43.20 ml  22.52 ml/m LA Vol (A4C):   48.7 ml 25.39 ml/m  LA Biplane Vol: 40.3 ml 21.01 ml/m  AORTIC VALVE LVOT Vmax:   117.00 cm/s LVOT Vmean:  74.800 cm/s LVOT VTI:    0.246 m  AORTA Ao Root diam: 3.60 cm MITRAL VALVE MV Area (PHT): 2.75 cm     SHUNTS MV Decel Time: 276 msec     Systemic VTI:  0.25 m MV E velocity: 91.30 cm/s   Systemic Diam: 2.00 cm MV A velocity: 117.00 cm/s MV E/A ratio:  0.78 Rudean Haskell MD Electronically signed by Rudean Haskell MD Signature Date/Time: 05/05/2021/1:37:50 PM    Final      The results of significant diagnostics from this hospitalization (including imaging, microbiology, ancillary and laboratory) are listed below for reference.     Microbiology: Recent Results (from the past 240 hour(s))  Resp Panel by RT-PCR (Flu A&B, Covid) Nasopharyngeal Swab     Status: None   Collection Time: 05/05/21  2:25 AM   Specimen: Nasopharyngeal Swab; Nasopharyngeal(NP) swabs in vial transport medium  Result Value Ref Range Status   SARS Coronavirus 2 by RT PCR NEGATIVE NEGATIVE Final    Comment: (NOTE) SARS-CoV-2 target nucleic acids are NOT DETECTED.  The SARS-CoV-2 RNA is generally detectable in upper respiratory specimens during the acute phase of  infection. The lowest concentration of SARS-CoV-2 viral copies this assay can detect is 138 copies/mL. A negative result does not preclude SARS-Cov-2 infection and should not be used as the sole basis for treatment or other patient management decisions. A negative result may occur with  improper specimen collection/handling, submission of specimen other than nasopharyngeal swab, presence of viral mutation(s) within the areas targeted by this assay, and inadequate number of viral copies(<138 copies/mL). A negative result must be combined with clinical observations, patient history, and epidemiological information. The expected result is Negative.  Fact Sheet for Patients:  EntrepreneurPulse.com.au  Fact Sheet for Healthcare Providers:  IncredibleEmployment.be  This test is no t yet approved or cleared by the [place] FDA and  has been authorized for detection and/or diagnosis of SARS-CoV-2 by FDA under an Emergency Use Authorization (EUA). This EUA will remain  in effect (meaning this test can be used) for the duration of the COVID-19 declaration under Section 564(b)(1) of the Act, 21 U.S.C.section 360bbb-3(b)(1), unless the authorization is terminated  or revoked sooner.       Influenza A by PCR NEGATIVE NEGATIVE Final   Influenza B by PCR NEGATIVE NEGATIVE Final    Comment: (NOTE) The Xpert Xpress SARS-CoV-2/FLU/RSV plus assay is intended as an aid in the diagnosis of influenza from Nasopharyngeal swab specimens and should not be used as a sole basis for treatment. Nasal washings and aspirates are unacceptable for Xpert Xpress SARS-CoV-2/FLU/RSV testing.  Fact Sheet for Patients: EntrepreneurPulse.com.au  Fact Sheet for Healthcare Providers: IncredibleEmployment.be  This test is not yet approved or cleared by the [place] FDA and has been authorized for detection and/or diagnosis of SARS-CoV-2  by FDA under an Emergency Use Authorization (EUA). This EUA will remain in effect (meaning this test can be used) for the duration of the COVID-19 declaration under Section 564(b)(1) of the Act, 21 U.S.C. section 360bbb-3(b)(1), unless the authorization is terminated or revoked.  Performed at [facility], [address] [place], [place] [address]      Labs: BNP (last 3 results) Recent Labs    10/02/20 1348 02/15/21 0233 03/31/21 0743  BNP 204.0* 55.0 70.3   Basic Metabolic Panel: Recent Labs  Lab 05/05/21 0229 05/05/21 0244 05/06/21 0216  NA 138 141  --   K 3.2* 3.3*  --   CL 102 103  --   CO2 29  --   --   GLUCOSE 95 93  --   BUN 19 17  --   CREATININE 1.01 1.10  --   CALCIUM 8.7*  --   --   MG  --   --  2.1   Liver Function Tests: Recent Labs  Lab 05/05/21 0229  AST 25  ALT 27  ALKPHOS 60  BILITOT 1.1  PROT 6.5  ALBUMIN 3.7   No results for input(s): LIPASE, AMYLASE in the last 168 hours. No results for input(s): AMMONIA in the last 168 hours. CBC: Recent Labs  Lab 05/05/21 0229 05/05/21 0244  WBC 14.8*  --   NEUTROABS 10.0*  --   HGB 14.1 15.0  HCT 44.1 44.0  MCV 100.0  --   PLT 254  --    Cardiac Enzymes: No results for input(s): CKTOTAL, CKMB, CKMBINDEX, TROPONINI in the last 168 hours. BNP: Invalid input(s): POCBNP CBG: No results for input(s): GLUCAP in the last 168 hours. D-Dimer No results for input(s): DDIMER in the last 72 hours. Hgb A1c Recent Labs    05/05/21 0229  HGBA1C 6.1*   Lipid Profile Recent Labs    05/05/21 0214  CHOL 126  HDL 56  LDLCALC 58  TRIG 62  CHOLHDL 2.3   Thyroid function studies No results for input(s): TSH, T4TOTAL, T3FREE, THYROIDAB in the last 72 hours.  Invalid input(s): FREET3 Anemia work up No results for input(s): VITAMINB12, FOLATE, FERRITIN, TIBC, IRON, RETICCTPCT in the last 72 hours. Urinalysis    Component Value Date/Time   COLORURINE YELLOW 08/06/2019 1333   APPEARANCEUR CLEAR  08/06/2019 1333   LABSPEC 1.045 (H) 08/06/2019 1333   PHURINE 6.0 08/06/2019 1333   GLUCOSEU NEGATIVE 08/06/2019 1333   HGBUR NEGATIVE 08/06/2019 Jeff 08/06/2019 1333   KETONESUR NEGATIVE 08/06/2019 1333   PROTEINUR NEGATIVE 08/06/2019 1333   UROBILINOGEN 1.0 06/12/2015 1021   NITRITE NEGATIVE 08/06/2019 1333   LEUKOCYTESUR NEGATIVE 08/06/2019 1333   Sepsis Labs Invalid input(s): PROCALCITONIN,  WBC,  LACTICIDVEN Microbiology Recent Results (from the past 240 hour(s))  Resp Panel by RT-PCR (Flu A&B, Covid) Nasopharyngeal Swab     Status: None   Collection Time: 05/05/21  2:25 AM   Specimen: Nasopharyngeal Swab; Nasopharyngeal(NP) swabs in vial transport medium  Result Value Ref Range Status   SARS Coronavirus 2 by RT PCR NEGATIVE NEGATIVE Final    Comment: (NOTE) SARS-CoV-2 target nucleic acids are NOT DETECTED.  The SARS-CoV-2 RNA is generally detectable in upper respiratory specimens during the acute phase of infection. The lowest concentration of SARS-CoV-2 viral copies this assay can detect is 138 copies/mL. A negative result does not preclude SARS-Cov-2 infection and should not be used as the sole basis for treatment or other patient management decisions. A negative result may occur with  improper specimen collection/handling, submission of specimen other than nasopharyngeal swab, presence of viral mutation(s) within the areas targeted by this assay, and inadequate number of viral copies(<138 copies/mL). A negative result must be combined with clinical observations, patient history, and epidemiological information. The expected result is Negative.  Fact Sheet for Patients:  EntrepreneurPulse.com.au  Fact Sheet for Healthcare Providers:  IncredibleEmployment.be  This test is no t yet approved or cleared by the [place] FDA and  has been authorized for detection and/or diagnosis of SARS-CoV-2 by FDA under  an Emergency Use Authorization (EUA). This EUA will remain  in effect (meaning this test can be used) for the duration of the COVID-19 declaration under Section 564(b)(1) of the Act, 21 U.S.C.section 360bbb-3(b)(1), unless the authorization is terminated  or revoked sooner.       Influenza A by PCR NEGATIVE NEGATIVE Final   Influenza B by PCR NEGATIVE NEGATIVE Final    Comment: (NOTE) The Xpert Xpress SARS-CoV-2/FLU/RSV plus assay is intended as an aid in the diagnosis of influenza from Nasopharyngeal swab specimens and should not be used as a sole basis for treatment. Nasal washings and aspirates are unacceptable for Xpert Xpress SARS-CoV-2/FLU/RSV testing.  Fact Sheet for Patients: EntrepreneurPulse.com.au  Fact Sheet for Healthcare Providers: IncredibleEmployment.be  This test is not yet approved or cleared by the [place] FDA and has been authorized for detection and/or diagnosis of SARS-CoV-2 by FDA under an Emergency Use Authorization (EUA). This EUA will remain in effect (meaning this test can be used) for the duration of the COVID-19 declaration under Section 564(b)(1) of the Act, 21 U.S.C. section 360bbb-3(b)(1), unless the authorization is terminated or revoked.  Performed at [facility], [address] [place],  [address]      Time coordinating discharge in minutes: 65  SIGNED:   Debbe Odea, MD  [facility] 05/06/2021, 3:29 PM

## 2021-05-06 NOTE — Care Management (Signed)
Attempt to reach patient by phone and cell phone to set up DC plan for therapies, no answer. LVM.

## 2021-05-06 NOTE — Evaluation (Signed)
Occupational Therapy Evaluation Patient Details Name: Tony Sullivan MRN: [id number] DOB: Jul 18, 1961 Today's Date: 05/06/2021    History of Present Illness Tony Sullivan is a 60 y.o. male with medical history significant of anxiety, osteoarthritis, COPD on home oxygen, depression, GERD, hypertension, prediabetes, seasonal allergies, tobacco and cocaine use, history of cervical fusion who is coming to the emergency department with complaints of left-sided tingling and weakness associated with frontal headache that started about 3 hours before arriving to the emergency department.  He denies blurred vision, dizziness or language difficulties.  No nausea or vomiting.  No gait disturbance.  No fever, chills, sore throat rhinorrhea, hemoptysis, but gets frequent wheezing and dyspnea.  No chest pain, palpitations, diaphoresis, PND, dyspnea or pitting edema of the lower extremities.  Denies abdominal pain, diarrhea, constipation, melena or hematochezia.   Clinical Impression   Pt pleasant and agreeable to session, endorses living along and having aide 3hrs/day 7 days/week; aide assists with ADL's and IADL's recently more than his typical baseline d/t generalized weakness. Pt also reports recently with increased reliance on motorized w/c for mobility. W/c does not access bathroom and pt requiring reliance on furniture for support and states by the time he makes it to commode he is concerned for falls d/t fatigue. Overall pt presents with significantly decreased activity tolerance, decreased strength and mild balance deficits leading to need for up to min A for ADL's and increased time for recovery with all transitions  and participation with ADL's in sitting and standing. PRE 3 on scale with brief transition to standing. Pt appears to be most appropriate for post acute at low intensity SNF level but with preference for d/c to home reporting concern for COVID if d/c to rehab. If d/c to home would strongly recommend  BSC to decrease risk of falls at home for transition to and from toilet in bathroom     Follow Up Recommendations  SNF;Home health OT (pt's preference is for home d/t concerns for COVID.)    Equipment Recommendations  3 in 1 bedside commode    Recommendations for Other Services       Precautions / Restrictions Precautions Precautions: Fall Restrictions Weight Bearing Restrictions: No      Mobility Bed Mobility Overal bed mobility: Modified Independent Bed Mobility: Supine to Sit;Sit to Supine     Supine to sit: Supervision;HOB elevated Sit to supine: Min guard;HOB elevated   General bed mobility comments: increased time and noted reliance on HOB elevation and rail    Transfers Overall transfer level: Needs assistance Equipment used: None Transfers: Sit to/from Stand Sit to Stand: Min assist              Balance                                           ADL either performed or assessed with clinical judgement   ADL Overall ADL's : Needs assistance/impaired Eating/Feeding: Independent   Grooming: Set up;Sitting   Upper Body Bathing: Set up;Sitting   Lower Body Bathing: Minimal assistance;Sitting/lateral leans   Upper Body Dressing : Set up;Sitting   Lower Body Dressing: Sit to/from stand;Minimal assistance   Toilet Transfer: Minimal assistance   Toileting- Clothing Manipulation and Hygiene: Minimal assistance;Sit to/from stand         General ADL Comments: Gross pt limited primarily by persistent SOB with activities during evaluation; benefiting  from intervention from therapist for distal LB d/t decreased toelrance for forward flexion     Vision Baseline Vision/History: No visual deficits Vision Assessment?: No apparent visual deficits     Perception     Praxis      Pertinent Vitals/Pain Pain Assessment: No/denies pain     Hand Dominance Right   Extremity/Trunk Assessment Upper Extremity Assessment Upper Extremity  Assessment: Generalized weakness   Lower Extremity Assessment Lower Extremity Assessment: Defer to PT evaluation   Cervical / Trunk Assessment Cervical / Trunk Assessment: Normal   Communication Communication Communication: No difficulties   Cognition Arousal/Alertness: Awake/alert Behavior During Therapy: WFL for tasks assessed/performed Overall Cognitive Status: Within Functional Limits for tasks assessed                                 General Comments: overall good safety awareness and insight   General Comments       Exercises     Shoulder Instructions      Home Living Family/patient expects to be discharged to:: Private residence Living Arrangements: Alone Available Help at Discharge: Family Type of Home: House Home Access: Ramped entrance     Home Layout: Multi-level     Bathroom Shower/Tub: Hospital doctor Toilet: Standard Bathroom Accessibility: No   Home Equipment: Environmental consultant - 2 wheels;Cane - single point;Wheelchair - power   Additional Comments: aide available 3hr/day 7 days/week      Prior Functioning/Environment Level of Independence: Independent with assistive device(s);Needs assistance  Gait / Transfers Assistance Needed: pt endorses recentl using primarily power chair for mobility, has access to [facility] and RW if needed ADL's / Homemaking Assistance Needed: requires assist            OT Problem List: Decreased strength;Decreased activity tolerance;Impaired balance (sitting and/or standing);Decreased safety awareness;Decreased knowledge of use of DME or AE;Decreased knowledge of precautions;Cardiopulmonary status limiting activity      OT Treatment/Interventions: Self-care/ADL training;Therapeutic exercise;Energy conservation;DME and/or AE instruction;Manual therapy;Therapeutic activities;Balance training;Patient/family education    OT Goals(Current goals can be found in the care plan section) Acute Rehab OT Goals Patient  Stated Goal: be able to go home OT Goal Formulation: With patient Time For Goal Achievement: 05/20/21 ADL Goals Pt Will Perform Upper Body Bathing: with modified independence Pt Will Perform Lower Body Bathing: with modified independence Pt Will Transfer to Toilet: with modified independence Pt Will Perform Toileting - Clothing Manipulation and hygiene: with modified independence  OT Frequency: Min 2X/week   Barriers to D/C:            Co-evaluation              AM-PAC OT "6 Clicks" Daily Activity     Outcome Measure Help from another person eating meals?: None Help from another person taking care of personal grooming?: None Help from another person toileting, which includes using toliet, bedpan, or urinal?: A Little Help from another person bathing (including washing, rinsing, drying)?: A Lot Help from another person to put on and taking off regular upper body clothing?: A Little Help from another person to put on and taking off regular lower body clothing?: A Lot 6 Click Score: 18   End of Session Equipment Utilized During Treatment: Oxygen  Activity Tolerance: Patient limited by fatigue Patient left: in bed;with call bell/phone within reach;with bed alarm set  OT Visit Diagnosis: Unsteadiness on feet (R26.81);Muscle weakness (generalized) (M62.81)  Time: 8138-8719 OT Time Calculation (min): 31 min Charges:  OT General Charges $OT Visit: 1 Visit OT Evaluation $OT Eval Low Complexity: 1 Low OT Treatments $Self Care/Home Management : 8-22 mins Alainah Phang OTR/L acute rehab services Office: [phone]  05/06/2021, 10:15 AM

## 2021-05-07 ENCOUNTER — Telehealth: Payer: Self-pay | Admitting: Internal Medicine

## 2021-05-07 ENCOUNTER — Other Ambulatory Visit: Payer: Self-pay | Admitting: Internal Medicine

## 2021-05-07 MED ORDER — PREDNISONE 10 MG PO TABS: 10.0000 mg | ORAL_TABLET | Freq: Every day | ORAL | 0 refills | Status: DC

## 2021-05-07 NOTE — Telephone Encounter (Signed)
Called patient but he did not answer. Left message for him to call back.  

## 2021-05-07 NOTE — Telephone Encounter (Signed)
Spoke with patient who states that he needs refill on Prednisone. He asked for it to be sent to local pharmacy in [place]. Rx has been sent. Nothing further needed at this time.

## 2021-05-14 ENCOUNTER — Emergency Department (HOSPITAL_COMMUNITY)
Admission: EM | Admit: 2021-05-14 | Discharge: 2021-05-14 | Disposition: A | Discharge status: Home / Self Care | Payer: Medicaid Other | Attending: Emergency Medicine | Admitting: Emergency Medicine

## 2021-05-14 DIAGNOSIS — R531 Weakness: Secondary | ICD-10-CM | POA: Diagnosis present

## 2021-05-14 DIAGNOSIS — Z5321 Procedure and treatment not carried out due to patient leaving prior to being seen by health care provider: Secondary | ICD-10-CM | POA: Diagnosis not present

## 2021-05-14 NOTE — ED Triage Notes (Signed)
Pt states he feels weak and his O2 readings at home have been in the upper 70's and 80's

## 2021-05-16 ENCOUNTER — Ambulatory Visit: Payer: Medicaid Other | Admitting: Internal Medicine

## 2021-05-16 NOTE — Progress Notes (Deleted)
Subjective:   Patient ID: Tony Sullivan, male    DOB: 1961/01/16  MRN: [id number]    Brief patient profile:  65  yobm active smoker with breathing difficulties since 2004 dx as copd on advair 250 /50 but getting worse since 2014 and placed on 02 for the first time in March 2015 and referred 01/07/2014 to pulmonary clinic by Dr Kennon Holter with GOLD II copd criteria established 02/14/14    History of Present Illness  01/07/2014 1st [facility] office visit/ [name]  Chief Complaint  Patient presents with   Advice Only    Referred for COPD, SOB.  Pt c/o SOB, low 02, prod cough with white mucous.  Pt forgot med list, unsure of what all he is taking  still can do a [employer] but uses [employer] parking, assoc with congested coughing esp in am worse in winter. rec Stop advair   Plan A =  Automatic =  symbicort 160 Take 2 puffs first thing in am and then another 2 puffs about 12 hours later. spiriva in am only  Plan B = Backup- Proventil use it only if you can't your breath - ok to use up to every 4 hours if needed Plan C = Nebulizer up to every 4 hours if needed only if you try plan B first and it doesn't wor   07/27/2018  f/u ov/[name] re: copd/ did not bring med calendar or all meds/ still smoking / prednisone 20 mg daily  Chief Complaint  Patient presents with   Follow-up    States his breathing is about the same but when it is hot it gets much worse.   Dyspnea:  Across room even on 02 sob unless uses nebs / MMRC4  = sob if tries to leave home or while getting dressed   Cough: clear / rattling Sleeping: < 30 degrees from multiple pillows SABA use: way over using 02: 02 2lpm   rec For cough / congestion > mucinex up to 1200 mg every 12 hours and use the flutter valve as much as you can Try off tudorza and start back on spiriva 2.5  X 2 pffs each am  Call if unable to get back into [facility] rehab  Please schedule a follow up office visit in 6 weeks, call sooner if needed with all  medications /inhalers/ solutions in hand    09/18/2018  f/u ov/[name] re:  GOLD III copd / prednisone 20 mg / did not bring med cal as req maint on symb 160/spiriva smi Chief Complaint  Patient presents with   Follow-up    Breathing is doing well. He is using his albuterol inhaler 4-5 per day because he states he has been walking more. He uses his neb 3 to 4 x per wk.    Dyspnea:  Can't do [employer] stopping on 1lpm but doesn't check sats or titrate up  Cough: esp in am x  first hour but mostly white and < a tbsp or two produced  Sleeping: ok flat  SABA use: as above 02: sleeping on 1.5- 2lpm and never above 1lpm walking  rec Try prednisone 10 mg daily to see if you do just as well and if flares then double the dose until better Only use your albuterol as a rescue medication  Please use flutter valve in am to help with the morning congestion     12/21/2018  f/u ov/[name] re:  GOLD III copd/ pred 30 mg daily with  floor 10 mg on symb 160/spiriva  Chief Complaint  Patient presents with   Follow-up    Pt c/o sinus pressure off and on for the past wk. He is having some increased SOB this morning.    Dyspnea:  Can do shopping at grocery on 2lpm does not check sats  Cough: usual cough = rattling  Sleeping: bed is flat / pillows up to 30 degrees  SABA use: up to q 6 hours on neb  02: 2lpm  rec Plan A = Automatic = symbicort 160 Take 2 puffs first thing in am and then another 2 puffs about 12 hours later/spiriva 2.5  X 3 puffs each am  Work on inhaler technique Plan B = Backup Only use your albuterol inhaler as a rescue medication  Plan C = Crisis - only use your albuterol nebulizer if you first try Plan B and it fails to help > ok to use the nebulizer up to every 4 hours but if start needing it regularly call for immediate appointment Plan D = Deltasone  Take 2 daily until better then 1 daily    05/13/2019  f/u ov/[name] re:  COPD III copd and pred 20 mg bid  Chief Complaint  Patient  presents with   Follow-up    Breathing has been progressively worse. He is still smoking. He is using his proair multiple x per day and neb daily.   Dyspnea:  Walking 2 blocks slow pace on 3-4 doesn't titrate  Cough: minimal esp am still clear  Sleeping: ok propped up 30 degrees  SABA use: way over using as above  02: no using it sitting , up to 4lpm walking, 2.5 hs  rec 02 is 2.5 lpm at bedtime and up to 4lpm POC walking   09/20/2019 televist rec Plan A = Automatic = Always=    symbicort and spiriva Plan B = Backup (to supplement plan A, not to replace it) Only use your albuterol inhaler as a rescue medication  Plan C = Crisis (instead of Plan B but only if Plan B stops working) - only use your albuterol nebulizer if you first try Plan B and it fails to help > ok to use the nebulizer up to every 4 hours but if start needing it regularly call for immediate appointment Plan D = Deltasone - if you need more albuterol especially the nebulizer to get thu the day then take prednisone 10 mg x 2 each am until better then return to 1 day    03/20/2020  f/u ov/[name] re:  GOLD III/ prednisone @ 20 mg per day  Chief Complaint  Patient presents with   Follow-up    pt is having sob and using rescue inhakler 5 to 6 times a week  Dyspnea:  Room to room despite 02  Cough: some in ams Sleeping: 30 degrees  SABA uses way too much  02: hs on 2.5  And 4lpm during the day, not titrating  rec Plan A = Automatic = Always=    symbicort and spiriva daily  Plan B = Backup (to supplement plan A, not to replace it) Only use your albuterol inhaler as rescue   Plan C = Crisis (instead of Plan B but only if Plan B stops working) - only use your albuterol nebulizer if you first try Plan B Plan D = Deltasone - if you need more albuterol especially the nebulizer to get thu the day then take prednisone 10 mg x 2 each am  until better then taper to 10 mg daily  - ok to use the middle step of 20 mg alternating with  10 mg daily on even vs odd dates  Ok try albuterol 15 min (one day use the inhaler then next the nebulizer) before an activity that you know would normally  make you short of breath  For cough > mucinex or mucinex dm up to 1200 mg every 12 hours    11/06/2020  f/u ov/[place] office/Gabryella Murfin re: GOLD III copd/ 02 dep still on pred at 20 mg daily  Chief Complaint  Patient presents with   Follow-up    Tightness of chest on and off for about 2 weeks   Dyspnea:  Room to room  Cough: min mucoid in am/ resumed smoking  Sleeping:still 30 degrees  SABA use: way too much, not prechallenging as rec  02: 2.5- 4lpm depending on how he feels,not tracking sats though has oxyimetry  Rec Continue pepcid 20 mg at bedtime and be sure to take protonix 40 mg Take 30-60 min before first meal of the day  Make sure you check your oxygen saturation  at your highest level of activity  to be sure it stays over 90%   For cough/ congestion/ tightness / discolored sputum > zpak  Please schedule a follow up visit in 3 months but call sooner if needed   05/16/2021  f/u ov/[place] office/Harlei Lehrmann re: GOLD III copd/ 02 dep maint on ***    prednisone @ *** mg/day No chief complaint on file.   Dyspnea:  *** Cough: *** Sleeping: *** SABA use: *** 02: *** Covid status: *** Lung cancer screening: ***   No obvious day to day or daytime variability or assoc excess/ purulent sputum or mucus plugs or hemoptysis or cp or chest tightness, subjective wheeze or overt sinus or hb symptoms.   *** without nocturnal  or early am exacerbation  of respiratory  c/o's or need for noct saba. Also denies any obvious fluctuation of symptoms with weather or environmental changes or other aggravating or alleviating factors except as outlined above   No unusual exposure hx or h/o childhood pna/ asthma or knowledge of premature birth.  Current Allergies, Complete Past Medical History, Past Surgical History, Family History, and Social History  were reviewed in [employer] record.  ROS  The following are not active complaints unless bolded Hoarseness, sore throat, dysphagia, dental problems, itching, sneezing,  nasal congestion or discharge of excess mucus or purulent secretions, ear ache,   fever, chills, sweats, unintended wt loss or wt gain, classically pleuritic or exertional cp,  orthopnea pnd or arm/hand swelling  or leg swelling, presyncope, palpitations, abdominal pain, anorexia, nausea, vomiting, diarrhea  or change in bowel habits or change in bladder habits, change in stools or change in urine, dysuria, hematuria,  rash, arthralgias, visual complaints, headache, numbness, weakness or ataxia or problems with walking or coordination,  change in mood or  memory.        No outpatient medications have been marked as taking for the 05/16/21 encounter (Appointment) with [name], MD.                 Objective:   Physical Exam    05/16/2021    *** 11/06/2020   159 03/20/2020   162 05/13/2019  159  12/21/2018    163      06/15/2014  164  >  09/15/2014 158 >   02/20/2015 >156 03/22/2015 >  07/31/2015   150 > 12/05/2015  161   > 04/01/2016    151 > 07/29/2016  152 >   02/25/2017  153 >  06/05/2017  153 > 11/03/2017   166 > 01/30/2018  167> 03/16/2018  168 > 06/10/2018  167 > 07/27/2018  169 > 09/18/2018 163 >       Vital signs reviewed  05/16/2021  - Note at rest 02 sats  ***% on ***   General appearance:    ***      Mod bar***

## 2021-05-23 ENCOUNTER — Other Ambulatory Visit: Payer: Self-pay

## 2021-05-23 ENCOUNTER — Telehealth: Payer: Self-pay

## 2021-05-23 DIAGNOSIS — I10 Essential (primary) hypertension: Secondary | ICD-10-CM

## 2021-05-23 DIAGNOSIS — M7989 Other specified soft tissue disorders: Secondary | ICD-10-CM

## 2021-05-23 MED ORDER — HYDROCHLOROTHIAZIDE 25 MG PO TABS: 25.0000 mg | ORAL_TABLET | Freq: Every day | ORAL | 0 refills | Status: DC

## 2021-05-23 NOTE — Telephone Encounter (Signed)
Increase his hydrochlorothiazide to 25 mg p.o. every morning.  Please check a BMP and magnesium in 1 week.  Please place labs and release them.

## 2021-05-23 NOTE — Telephone Encounter (Signed)
Done

## 2021-05-23 NOTE — Telephone Encounter (Signed)
Patient calling complaining of swelling in his feet wanted to know if we could send lasix, however he is taking hctz, please advise.

## 2021-06-01 ENCOUNTER — Other Ambulatory Visit: Payer: Self-pay

## 2021-06-01 ENCOUNTER — Emergency Department (HOSPITAL_COMMUNITY)
Admission: EM | Admit: 2021-06-01 | Discharge: 2021-06-01 | Disposition: A | Discharge status: Home / Self Care | Payer: Medicaid Other | Attending: Emergency Medicine | Admitting: Emergency Medicine

## 2021-06-01 ENCOUNTER — Encounter (HOSPITAL_COMMUNITY): Payer: Self-pay | Admitting: *Deleted

## 2021-06-01 ENCOUNTER — Emergency Department (HOSPITAL_COMMUNITY): Payer: Medicaid Other

## 2021-06-01 DIAGNOSIS — R0789 Other chest pain: Secondary | ICD-10-CM

## 2021-06-01 DIAGNOSIS — J441 Chronic obstructive pulmonary disease with (acute) exacerbation: Secondary | ICD-10-CM | POA: Diagnosis not present

## 2021-06-01 DIAGNOSIS — I10 Essential (primary) hypertension: Secondary | ICD-10-CM | POA: Insufficient documentation

## 2021-06-01 DIAGNOSIS — R0600 Dyspnea, unspecified: Secondary | ICD-10-CM | POA: Insufficient documentation

## 2021-06-01 DIAGNOSIS — F17210 Nicotine dependence, cigarettes, uncomplicated: Secondary | ICD-10-CM | POA: Diagnosis not present

## 2021-06-01 DIAGNOSIS — R059 Cough, unspecified: Secondary | ICD-10-CM | POA: Insufficient documentation

## 2021-06-01 DIAGNOSIS — Z79899 Other long term (current) drug therapy: Secondary | ICD-10-CM | POA: Insufficient documentation

## 2021-06-01 DIAGNOSIS — Z7951 Long term (current) use of inhaled steroids: Secondary | ICD-10-CM | POA: Diagnosis not present

## 2021-06-01 DIAGNOSIS — R0781 Pleurodynia: Secondary | ICD-10-CM | POA: Insufficient documentation

## 2021-06-01 LAB — BASIC METABOLIC PANEL
Anion gap: 7 (ref 5–15)
BUN: 19 mg/dL (ref 6–20)
CO2: 30 mmol/L (ref 22–32)
Calcium: 9 mg/dL (ref 8.9–10.3)
Chloride: 102 mmol/L (ref 98–111)
Creatinine, Ser: 1.07 mg/dL (ref 0.61–1.24)
GFR, Estimated: >60 mL/min (ref >60)
Glucose, Bld: 135 mg/dL — ABNORMAL HIGH (ref 70–99)
Potassium: 3.7 mmol/L (ref 3.5–5.1)
Sodium: 139 mmol/L (ref 135–145)

## 2021-06-01 LAB — CBC
HCT: 48.1 % (ref 39.0–52.0)
Hemoglobin: 15 g/dL (ref 13.0–17.0)
MCH: 31.3 pg (ref 26.0–34.0)
MCHC: 31.2 g/dL (ref 30.0–36.0)
MCV: 100.2 fL — ABNORMAL HIGH (ref 80.0–100.0)
Platelets: 292 10*3/uL (ref 150–400)
RBC: 4.8 MIL/uL (ref 4.22–5.81)
RDW: 14.9 % (ref 11.5–15.5)
WBC: 11.4 10*3/uL — ABNORMAL HIGH (ref 4.0–10.5)
nRBC: 0 % (ref 0.0–0.2)

## 2021-06-01 LAB — TROPONIN I (HIGH SENSITIVITY)
Troponin I (High Sensitivity): 10 ng/L (ref <18)
Troponin I (High Sensitivity): 10 ng/L (ref <18)

## 2021-06-01 LAB — D-DIMER, QUANTITATIVE: D-Dimer, Quant: 0.42 ug/mL-FEU (ref 0.00–0.50)

## 2021-06-01 IMAGING — DX DG CHEST 2V
3 series · 3 of 3 positions shown · non-contrast
Comparison: [DATE]

CLINICAL DATA: Chest pain

EXAM:
CHEST - 2 VIEW

[chest lat]
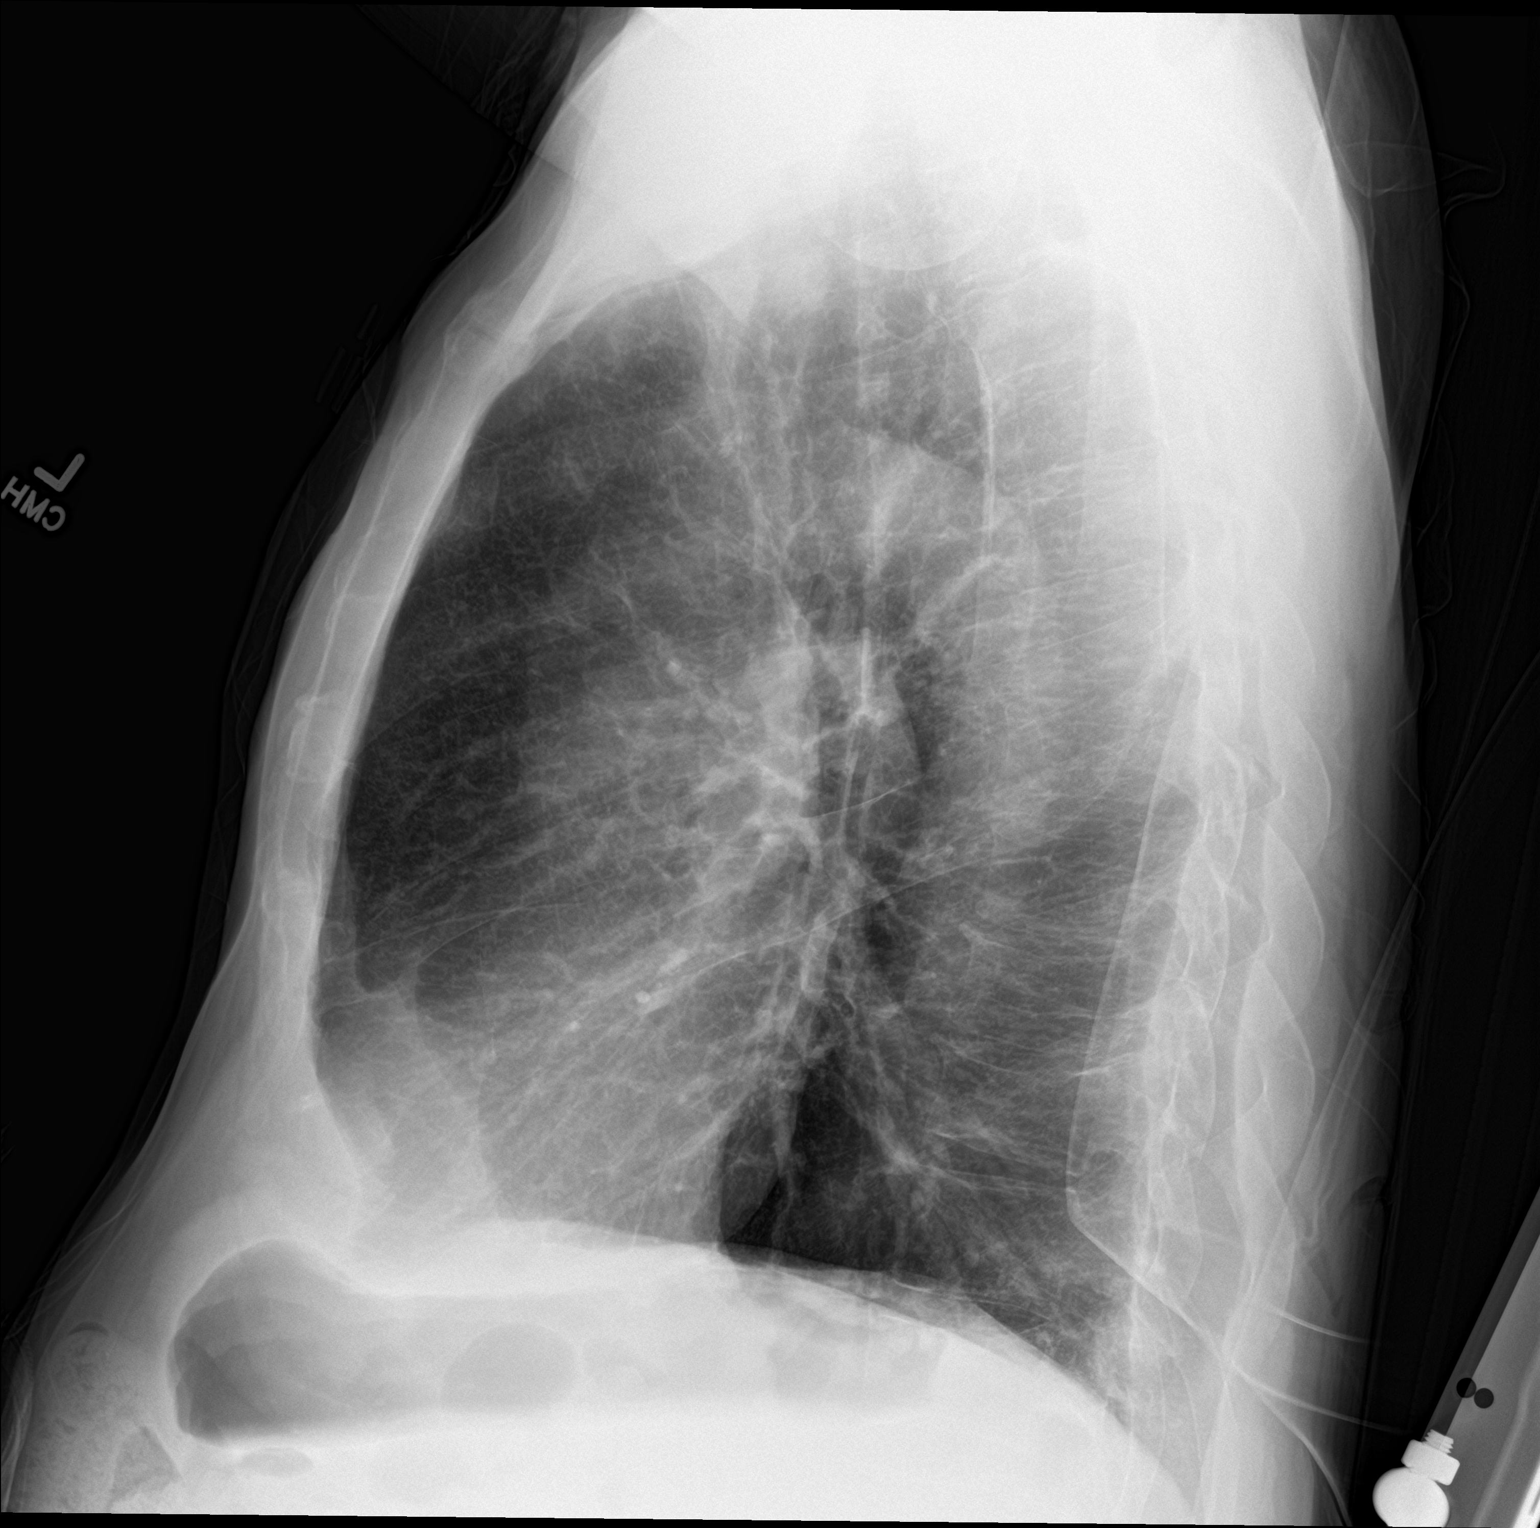

[chest ap (1 of 2)]
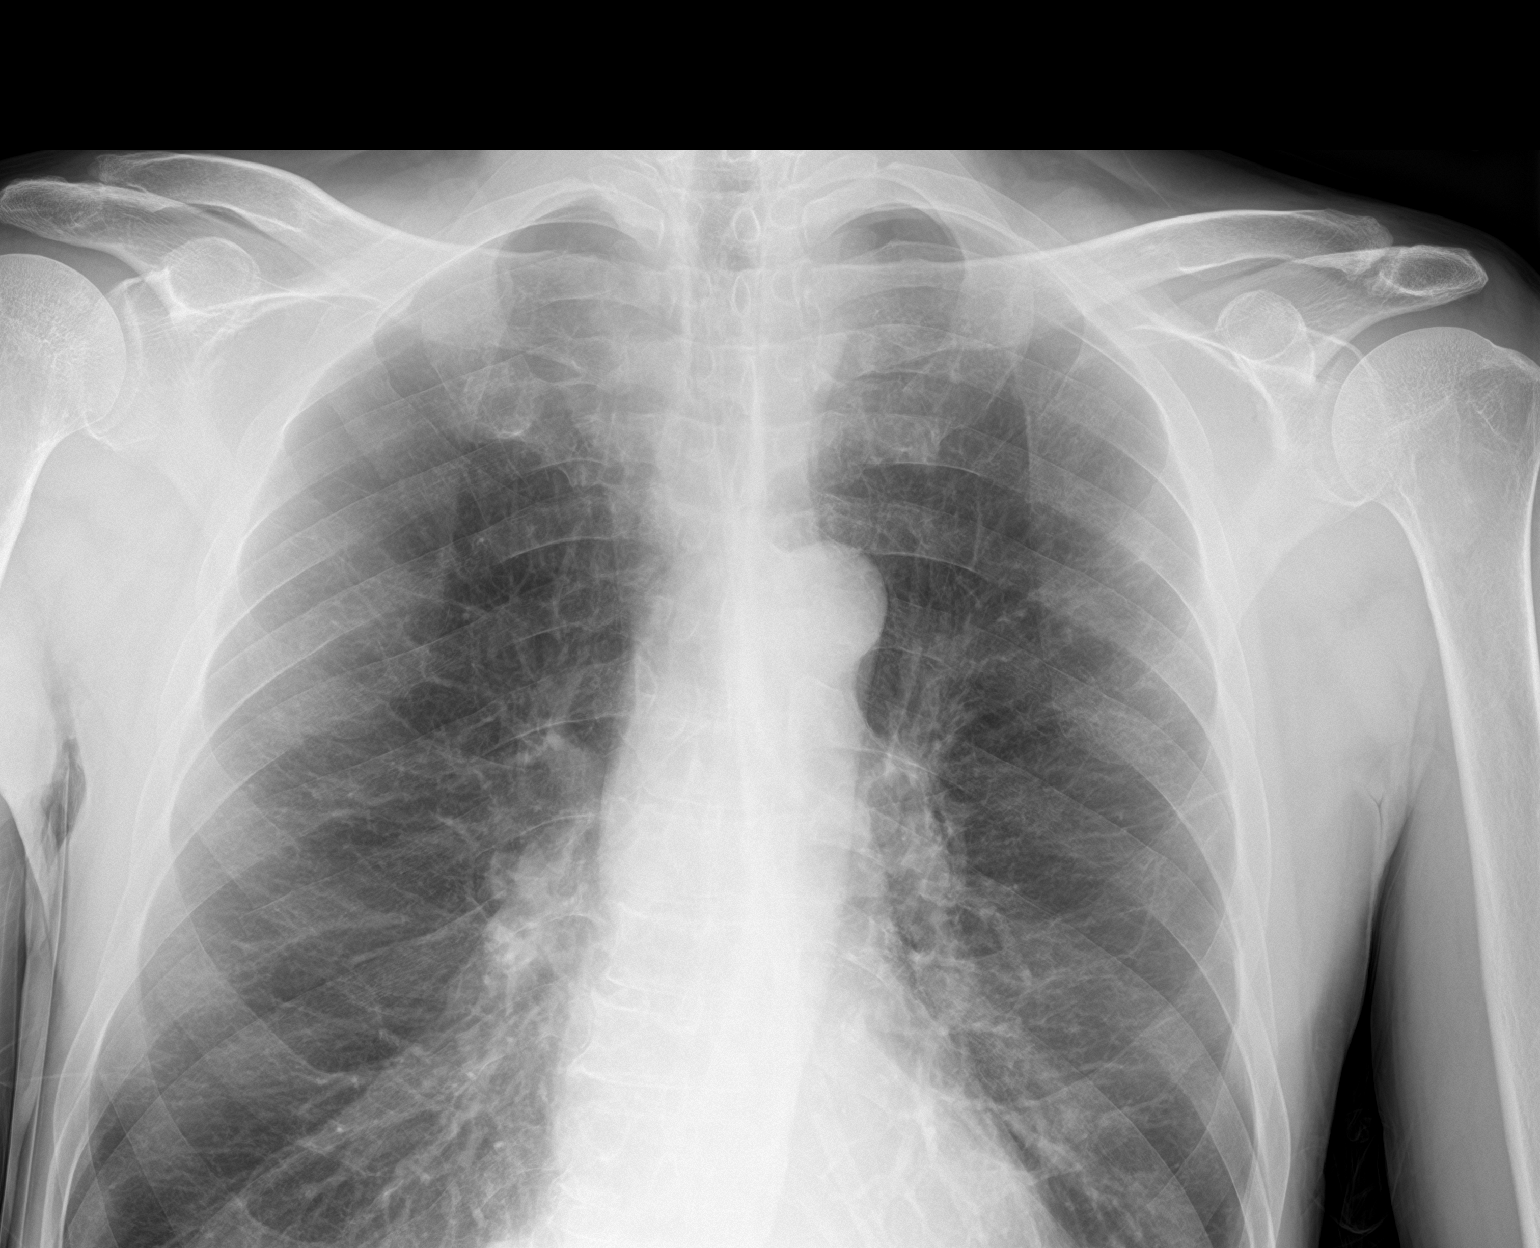

[chest ap (2 of 2)]
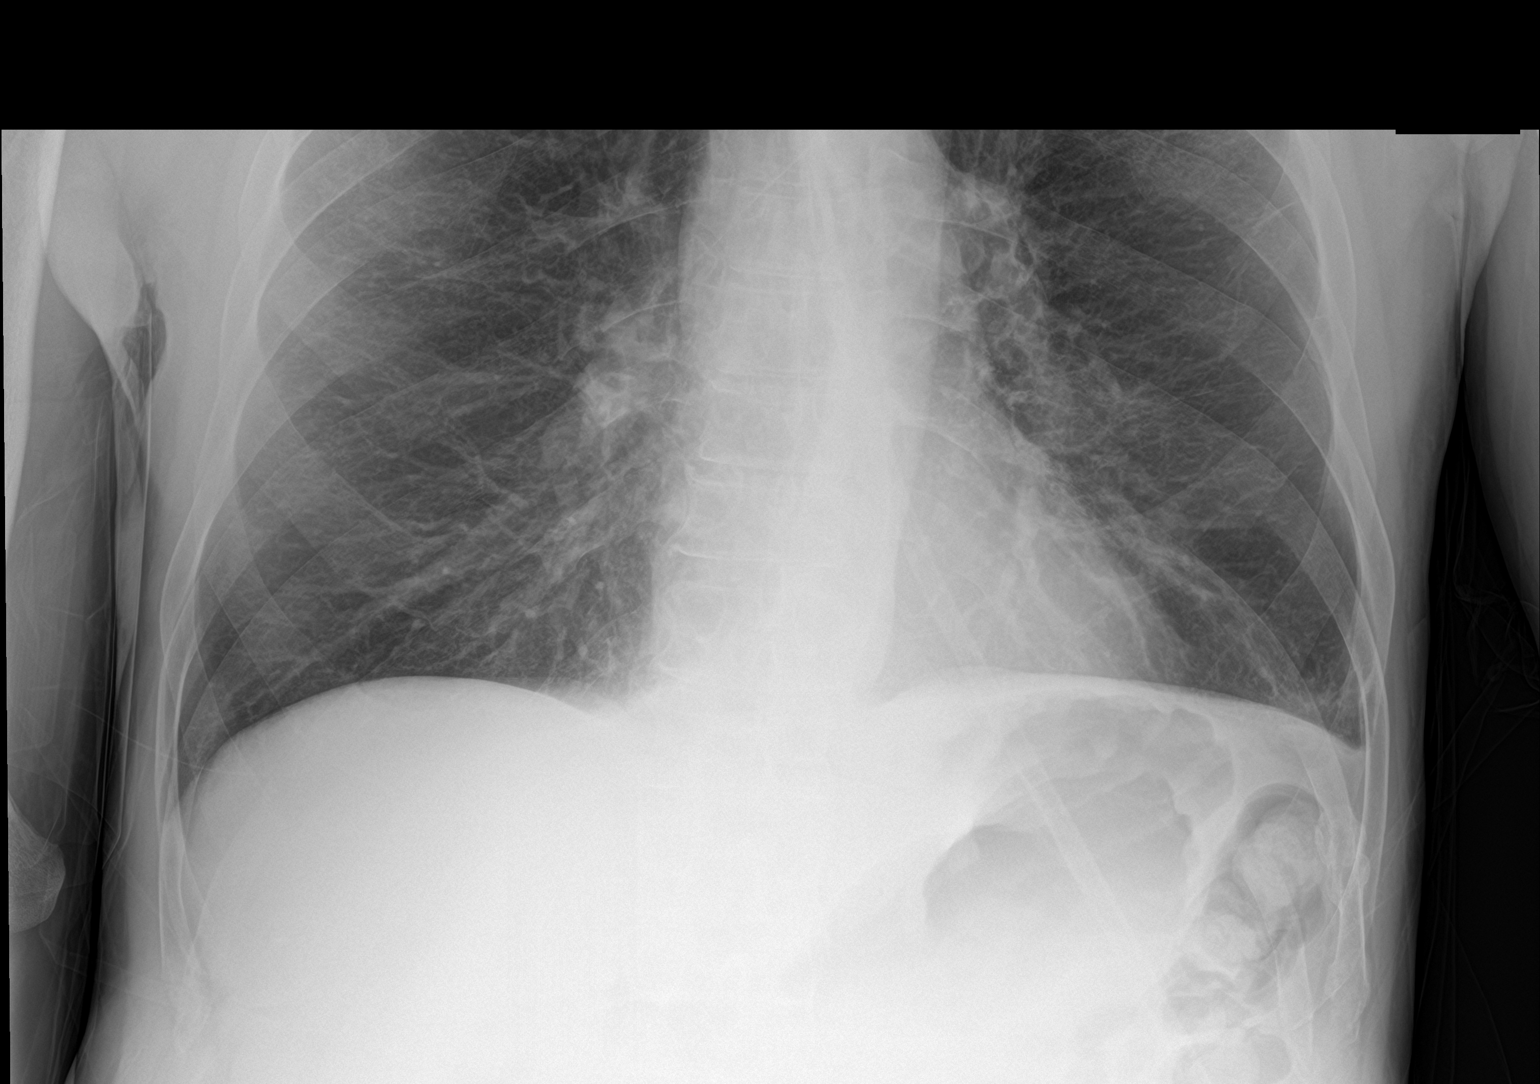

[3 of 3 positions shown; findings below may reference images not displayed]

FINDINGS: There is hyperinflation of the lungs compatible with COPD. Heart and
mediastinal contours are within normal limits. No focal opacities or
effusions. No acute bony abnormality.
IMPRESSION: COPD.  No active disease.

## 2021-06-01 MED ORDER — ACETAMINOPHEN 500 MG PO TABS
1000.0000 mg | ORAL_TABLET | Freq: Once | ORAL | Status: AC
  Administered 2021-06-01: 1000 mg via ORAL
  Filled 2021-06-01: qty 2

## 2021-06-01 MED ORDER — ALBUTEROL SULFATE HFA 108 (90 BASE) MCG/ACT IN AERS
4.0000 | INHALATION_SPRAY | Freq: Once | RESPIRATORY_TRACT | Status: AC
  Administered 2021-06-01: 4 via RESPIRATORY_TRACT
  Filled 2021-06-01: qty 6.7

## 2021-06-01 NOTE — Discharge Instructions (Addendum)
If you develop recurrent, continued, or worsening chest pain, shortness of breath, fever, vomiting, abdominal or back pain, or any other new/concerning symptoms then return to the ER for evaluation.  

## 2021-06-01 NOTE — ED Triage Notes (Signed)
Chest pain with dizziness

## 2021-06-01 NOTE — ED Provider Notes (Signed)
[facility] EMERGENCY DEPARTMENT Provider Note   CSN: [id number] Arrival date & time: 06/01/21  1625     History Chief Complaint  Patient presents with   Chest Pain    Tony Sullivan is a 60 y.o. male.  HPI 60 year old male presents with a chief complaint of right-sided chest pain.  Started about 3 hours prior to arrival.  It is a sharp pain and pleuritic.  Chronically has a cough and chronically has dyspnea on home oxygen but these are not worse than typical.  No fevers.  Has chronic leg swelling that is unchanged.  Past Medical History:  Diagnosis Date   Anxiety    Arthritis    COPD (chronic obstructive pulmonary disease) ([place]) 2004   diagnosed in 2004, no PFT's to date.  Started on home O2 12/2013, after found to be desatting at PCP's office, and referred to pulmonology.   Depression    GERD (gastroesophageal reflux disease)    High blood pressure    Prediabetes    Seasonal allergies    Tobacco dependence     Patient Active Problem List   Diagnosis Date Noted   Numbness and tingling in left arm 05/06/2021   Leukocytosis 05/05/2021   Severe headache 05/05/2021   Hyperlipidemia 05/24/2020   Hypokalemia 05/24/2020   Chest pain 05/23/2020   Bipolar disorder ([place]) 05/23/2020   HNP (herniated nucleus pulposus), cervical 09/06/2019   Abdominal pain    Poor dentition 11/04/2017   Acute on chronic respiratory failure with hypoxia ([place]) 08/29/2017   ETOH abuse 03/30/2017   Cigarette smoker 03/30/2017   Acute respiratory failure with hypoxia and hypercapnia ([facility]) 03/29/2017   Special screening for malignant neoplasms, colon    Benign neoplasm of ascending colon    Benign neoplasm of descending colon    Hoarseness 08/22/2016   GERD (gastroesophageal reflux disease) 08/22/2016   Atypical chest pain 12/05/2015   Essential hypertension 06/15/2014   Pectoralis muscle strain 01/11/2014   COPD with acute exacerbation ([place]) 01/10/2014   COPD  GOLD III 01/08/2014   Smoker  01/08/2014   Cough 01/08/2014   Chronic respiratory failure with hypoxia ([place]) 01/08/2014    Past Surgical History:  Procedure Laterality Date   ANTERIOR CERVICAL DECOMP/DISCECTOMY FUSION N/A 09/06/2019   Procedure: Anterior Cervical Discecectomy Fusion - Cerivcal Five-Cervical Six;  Surgeon: Kary Kos, MD;  Location: [place];  Service: Neurosurgery;  Laterality: N/A;  Anterior Cervical Discecectomy Fusion - Cerivcal Five-Cervical Six   ANTERIOR CERVICAL DISCECTOMY  09/06/2019   APPENDECTOMY  1980   COLONOSCOPY WITH PROPOFOL N/A 12/10/2016   Procedure: COLONOSCOPY WITH PROPOFOL;  Surgeon: [name], MD;  Location: [facility];  Service: Endoscopy;  Laterality: N/A;   HIP SURGERY Right    SHOULDER ARTHROSCOPY Right        Family History  Problem Relation Age of Onset   Emphysema Mother    Allergies Mother        "everyone in family"   Asthma Mother        "everyone in family"   Heart disease Mother    Clotting disorder Mother    Cancer Mother    Emphysema Father    Allergies Father    Allergies Son    Seizures Brother    Diabetes Brother     Social History   Tobacco Use   Smoking status: Some Days    Packs/day: 0.00    Years: 36.00    Pack years: 0.00    Types: Cigarettes  Smokeless tobacco: Never   Tobacco comments:    smokes 2 cigarettes per day 11/06/2020  Vaping Use   Vaping Use: Never used  Substance Use Topics   Alcohol use: Not Currently    Alcohol/week: 14.0 - 21.0 standard drinks    Types: 14 - 21 Standard drinks or equivalent per week    Comment: 2-3 beers/day, with occasional "binges".  No history of withdrawal   Drug use: Not Currently    Types: Marijuana, Cocaine    Comment: last cocaine use last month    Home Medications Prior to Admission medications   Medication Sig Start Date End Date Taking? Authorizing Provider  albuterol (PROAIR HFA) 108 (90 Base) MCG/ACT inhaler Inhale 2 puffs into the lungs every 4 (four) hours as needed. 03/19/21    Fredia Sorrow, MD  albuterol (PROVENTIL) (2.5 MG/3ML) 0.083% nebulizer solution Take 3 mLs (2.5 mg total) by nebulization every 4 (four) hours as needed for wheezing or shortness of breath. 04/28/18   [name], NP  atorvastatin (LIPITOR) 40 MG tablet TAKE ONE TABLET BY MOUTH DAILY 07/06/20   [name], MD  benztropine (COGENTIN) 0.5 MG tablet Take 0.5 mg by mouth at bedtime.     [provider]  budesonide-formoterol (SYMBICORT) 160-4.5 MCG/ACT inhaler INHALE TWO PUFFS BY MOUTH INTO THE LUNGS TWICE A DAY 03/19/21   Fredia Sorrow, MD  cetirizine (ZYRTEC) 10 MG tablet Take 10 mg by mouth daily.    [provider]  diclofenac Sodium (VOLTAREN) 1 % GEL Apply 2 g topically 4 (four) times daily.    [provider]  dorzolamide (TRUSOPT) 2 % ophthalmic solution Place 1 drop into the left eye 2 (two) times daily. 12/12/20   [provider]  famotidine (PEPCID) 20 MG tablet TAKE 1 TABLET (20 MG TOTAL) BY MOUTH AT BEDTIME. 02/05/21   [name], MD  fluticasone (FLONASE) 50 MCG/ACT nasal spray Place 2 sprays into both nostrils 3 (three) times daily as needed for allergies.     [provider]  furosemide (LASIX) 20 MG tablet Take 1 tablet (20 mg total) by mouth daily. 05/01/21   [name], MD  hydrochlorothiazide (HYDRODIURIL) 25 MG tablet Take 1 tablet (25 mg total) by mouth daily. 05/23/21   Tolia, Sunit, DO  latanoprost (XALATAN) 0.005 % ophthalmic solution Place 1 drop into both eyes at bedtime. 05/24/19   [provider]  LORazepam (ATIVAN) 1 MG tablet Take 1 tablet (1 mg total) by mouth 3 (three) times daily as needed for anxiety. 02/15/21   Daleen Bo, MD  losartan (COZAAR) 100 MG tablet Take 100 mg by mouth daily.    [provider]  loxapine (LOXITANE) 10 MG capsule Take 10 mg by mouth at bedtime.     [provider]  megestrol (MEGACE) 40 MG tablet Take 40 mg by mouth daily.    [provider]   mirtazapine (REMERON) 30 MG tablet Take 30 mg by mouth at bedtime.    [provider]  Multiple Vitamin (MULTIVITAMIN) capsule Take 1 capsule by mouth daily.    [provider]  mupirocin cream (BACTROBAN) 2 % Apply 1 application topically 2 (two) times daily. 05/01/21   [name], MD  OXYGEN Inhale 3-4 L into the lungs as directed. 3-4 liters with sleep and exertion as needed for low oxygen    [provider]  pantoprazole (PROTONIX) 40 MG tablet Take 40 mg by mouth daily.    [provider]  polyethylene glycol (MIRALAX / GLYCOLAX) 17 g packet TAKE 17 GRAMS BY MOUTH DAILY 03/09/20   [name], MD  potassium chloride SA (KLOR-CON) 20 MEQ tablet Take 1 tablet (20 mEq total) by mouth 2 (two) times daily. 02/15/21   Daleen Bo, MD  predniSONE (DELTASONE) 10 MG tablet 1-2 TABLETS AS DIRECTED 05/07/21   [name], MD  predniSONE (DELTASONE) 10 MG tablet Take 1 tablet (10 mg total) by mouth daily. 05/07/21   [name], MD  risperiDONE (RISPERDAL) 2 MG tablet Take 2 mg by mouth at bedtime.    [provider]  Tiotropium Bromide Monohydrate (SPIRIVA RESPIMAT) 2.5 MCG/ACT AERS Inhale 2 puffs into the lungs daily. 03/19/21 05/05/21  Fredia Sorrow, MD    Allergies    Penicillins and Levocetirizine  Review of Systems   Review of Systems  Constitutional:  Negative for fever.  Respiratory:  Positive for cough and shortness of breath.   Cardiovascular:  Positive for chest pain and leg swelling.  All other systems reviewed and are negative.  Physical Exam Updated Vital Signs BP 128/77   Pulse 70   Temp 98.2 F (36.8 C) (Oral)   Resp 20   SpO2 98%   Physical Exam Vitals and nursing note reviewed.  Constitutional:      Appearance: He is well-developed.  HENT:     Head: Normocephalic and atraumatic.     Right Ear: External ear normal.     Left Ear: External ear normal.     Nose: Nose normal.  Eyes:     General:        Right  eye: No discharge.        Left eye: No discharge.  Cardiovascular:     Rate and Rhythm: Normal rate and regular rhythm.     Heart sounds: Normal heart sounds.  Pulmonary:     Effort: Pulmonary effort is normal.     Breath sounds: Decreased breath sounds and wheezing (mild) present.  Chest:     Chest wall: Tenderness (right sided chest wall tenderness) present.  Abdominal:     Palpations: Abdomen is soft.     Tenderness: There is no abdominal tenderness.  Musculoskeletal:     Cervical back: Neck supple.  Skin:    General: Skin is warm and dry.  Neurological:     Mental Status: He is alert.  Psychiatric:        Mood and Affect: Mood is not anxious.    ED Results / Procedures / Treatments   Labs (all labs ordered are listed, but only abnormal results are displayed) Labs Reviewed  BASIC METABOLIC PANEL - Abnormal; Notable for the following components:      Result Value   Glucose, Bld 135 (*)    All other components within normal limits  CBC - Abnormal; Notable for the following components:   WBC 11.4 (*)    MCV 100.2 (*)    All other components within normal limits  D-DIMER, QUANTITATIVE  TROPONIN I (HIGH SENSITIVITY)  TROPONIN I (HIGH SENSITIVITY)    EKG EKG Interpretation  Date/Time:  Friday June 01 2021 16:32:32 EDT Ventricular Rate:  85 PR Interval:  146 QRS Duration: 76 QT Interval:  376 QTC Calculation: 447 R Axis:   172 Text Interpretation: Sinus rhythm with Premature atrial complexes Left posterior fascicular block Abnormal QRS-T angle, consider primary T wave abnormality Abnormal ECG Confirmed by Sherwood Gambler [phone]) on 06/01/2021 4:40:28 PM  Radiology DG Chest 2 View  Result Date: 06/01/2021 CLINICAL DATA:  Chest pain EXAM: CHEST - 2 VIEW COMPARISON:  04/12/2021 FINDINGS: There is hyperinflation of the lungs compatible with COPD. Heart and mediastinal contours are within normal limits. No focal opacities or effusions. No acute bony abnormality.  IMPRESSION: COPD.  No active disease. Electronically Signed   By: Rolm Baptise M.D.   On: 06/01/2021 17:39    Procedures Procedures   Medications Ordered in ED Medications  albuterol (VENTOLIN HFA) 108 (90 Base) MCG/ACT inhaler 4 puff (4 puffs Inhalation Given 06/01/21 2142)  acetaminophen (TYLENOL) tablet 1,000 mg (1,000 mg Oral Given 06/01/21 2142)    ED Course  I have reviewed the triage vital signs and the nursing notes.  Pertinent labs & imaging results that were available during my care of the patient were reviewed by me and considered in my medical decision making (see chart for details).    MDM Rules/Calculators/A&P                           Patient's chest pain appears to be chest wall in etiology.  Given the pleuritic components (which definitely could have been just because of chest wall pain) a D-dimer was sent for otherwise low risk PE given he is not hypoxic, hypotensive, tachycardic.  D-dimer is negative think as well as 2 negative troponins.  Chest x-ray is unremarkable.  At this point he appears stable for discharge home.  Will give return precautions. Final Clinical Impression(s) / ED Diagnoses Final diagnoses:  Chest wall pain    Rx / DC Orders ED Discharge Orders     None        Sherwood Gambler, MD 06/01/21 2347

## 2021-06-03 ENCOUNTER — Emergency Department (HOSPITAL_COMMUNITY)
Admission: EM | Admit: 2021-06-03 | Discharge: 2021-06-03 | Disposition: A | Discharge status: Home / Self Care | Payer: Medicaid Other | Attending: Emergency Medicine | Admitting: Emergency Medicine

## 2021-06-03 ENCOUNTER — Encounter (HOSPITAL_COMMUNITY): Payer: Self-pay | Admitting: Emergency Medicine

## 2021-06-03 ENCOUNTER — Emergency Department (HOSPITAL_COMMUNITY): Payer: Medicaid Other

## 2021-06-03 ENCOUNTER — Other Ambulatory Visit: Payer: Self-pay

## 2021-06-03 DIAGNOSIS — F17210 Nicotine dependence, cigarettes, uncomplicated: Secondary | ICD-10-CM | POA: Diagnosis not present

## 2021-06-03 DIAGNOSIS — Z20822 Contact with and (suspected) exposure to covid-19: Secondary | ICD-10-CM | POA: Diagnosis not present

## 2021-06-03 DIAGNOSIS — J441 Chronic obstructive pulmonary disease with (acute) exacerbation: Secondary | ICD-10-CM | POA: Diagnosis not present

## 2021-06-03 DIAGNOSIS — I10 Essential (primary) hypertension: Secondary | ICD-10-CM | POA: Diagnosis not present

## 2021-06-03 DIAGNOSIS — R0789 Other chest pain: Secondary | ICD-10-CM

## 2021-06-03 DIAGNOSIS — Z79899 Other long term (current) drug therapy: Secondary | ICD-10-CM | POA: Insufficient documentation

## 2021-06-03 DIAGNOSIS — R079 Chest pain, unspecified: Secondary | ICD-10-CM | POA: Diagnosis present

## 2021-06-03 LAB — CBC WITH DIFFERENTIAL/PLATELET
Abs Immature Granulocytes: 0.1 10*3/uL — ABNORMAL HIGH (ref 0.00–0.07)
Basophils Absolute: 0.1 10*3/uL (ref 0.0–0.1)
Basophils Relative: 1 %
Eosinophils Absolute: 0.1 10*3/uL (ref 0.0–0.5)
Eosinophils Relative: 1 %
HCT: 45.3 % (ref 39.0–52.0)
Hemoglobin: 14.3 g/dL (ref 13.0–17.0)
Immature Granulocytes: 1 %
Lymphocytes Relative: 26 %
Lymphs Abs: 3.5 10*3/uL (ref 0.7–4.0)
MCH: 31.5 pg (ref 26.0–34.0)
MCHC: 31.6 g/dL (ref 30.0–36.0)
MCV: 99.8 fL (ref 80.0–100.0)
Monocytes Absolute: 0.9 10*3/uL (ref 0.1–1.0)
Monocytes Relative: 7 %
Neutro Abs: 8.7 10*3/uL — ABNORMAL HIGH (ref 1.7–7.7)
Neutrophils Relative %: 64 %
Platelets: 284 10*3/uL (ref 150–400)
RBC: 4.54 MIL/uL (ref 4.22–5.81)
RDW: 14.7 % (ref 11.5–15.5)
WBC: 13.4 10*3/uL — ABNORMAL HIGH (ref 4.0–10.5)
nRBC: 0 % (ref 0.0–0.2)

## 2021-06-03 LAB — BASIC METABOLIC PANEL
Anion gap: 6 (ref 5–15)
BUN: 11 mg/dL (ref 6–20)
CO2: 30 mmol/L (ref 22–32)
Calcium: 8.5 mg/dL — ABNORMAL LOW (ref 8.9–10.3)
Chloride: 102 mmol/L (ref 98–111)
Creatinine, Ser: 1 mg/dL (ref 0.61–1.24)
GFR, Estimated: >60 mL/min (ref >60)
Glucose, Bld: 90 mg/dL (ref 70–99)
Potassium: 3.2 mmol/L — ABNORMAL LOW (ref 3.5–5.1)
Sodium: 138 mmol/L (ref 135–145)

## 2021-06-03 LAB — BRAIN NATRIURETIC PEPTIDE: B Natriuretic Peptide: 41 pg/mL (ref 0.0–100.0)

## 2021-06-03 LAB — RESP PANEL BY RT-PCR (FLU A&B, COVID) ARPGX2
Influenza A by PCR: NEGATIVE
Influenza B by PCR: NEGATIVE
SARS Coronavirus 2 by RT PCR: NEGATIVE

## 2021-06-03 LAB — D-DIMER, QUANTITATIVE: D-Dimer, Quant: 0.43 ug/mL-FEU (ref 0.00–0.50)

## 2021-06-03 LAB — TROPONIN I (HIGH SENSITIVITY)
Troponin I (High Sensitivity): 12 ng/L (ref <18)
Troponin I (High Sensitivity): 11 ng/L (ref <18)

## 2021-06-03 IMAGING — DX DG CHEST 1V PORT
1 series · 2 of 2 positions shown · non-contrast
Comparison: [DATE]

CLINICAL DATA: Shortness of breath

EXAM:
PORTABLE CHEST 1 VIEW

[Series 1: chest ap · 0.14mm/px · 2 of 2 slices shown]
[im 1/2]
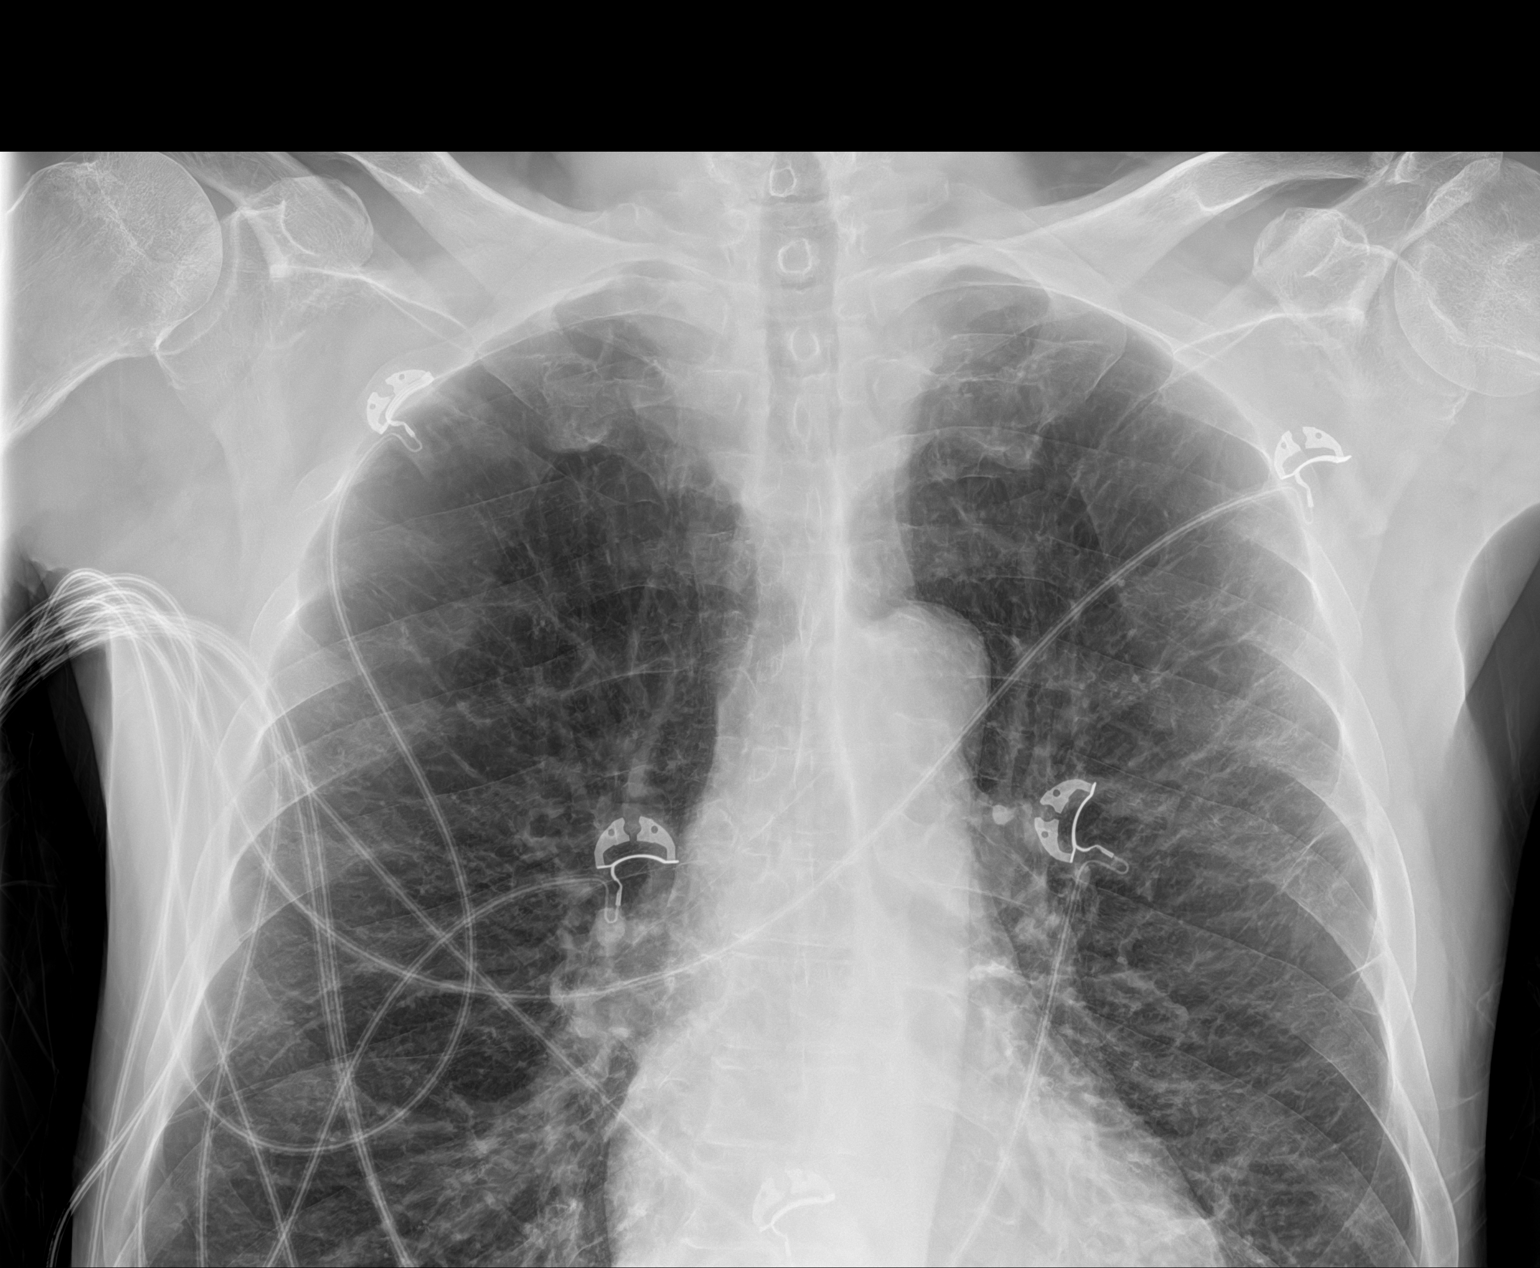
[im 2/2]
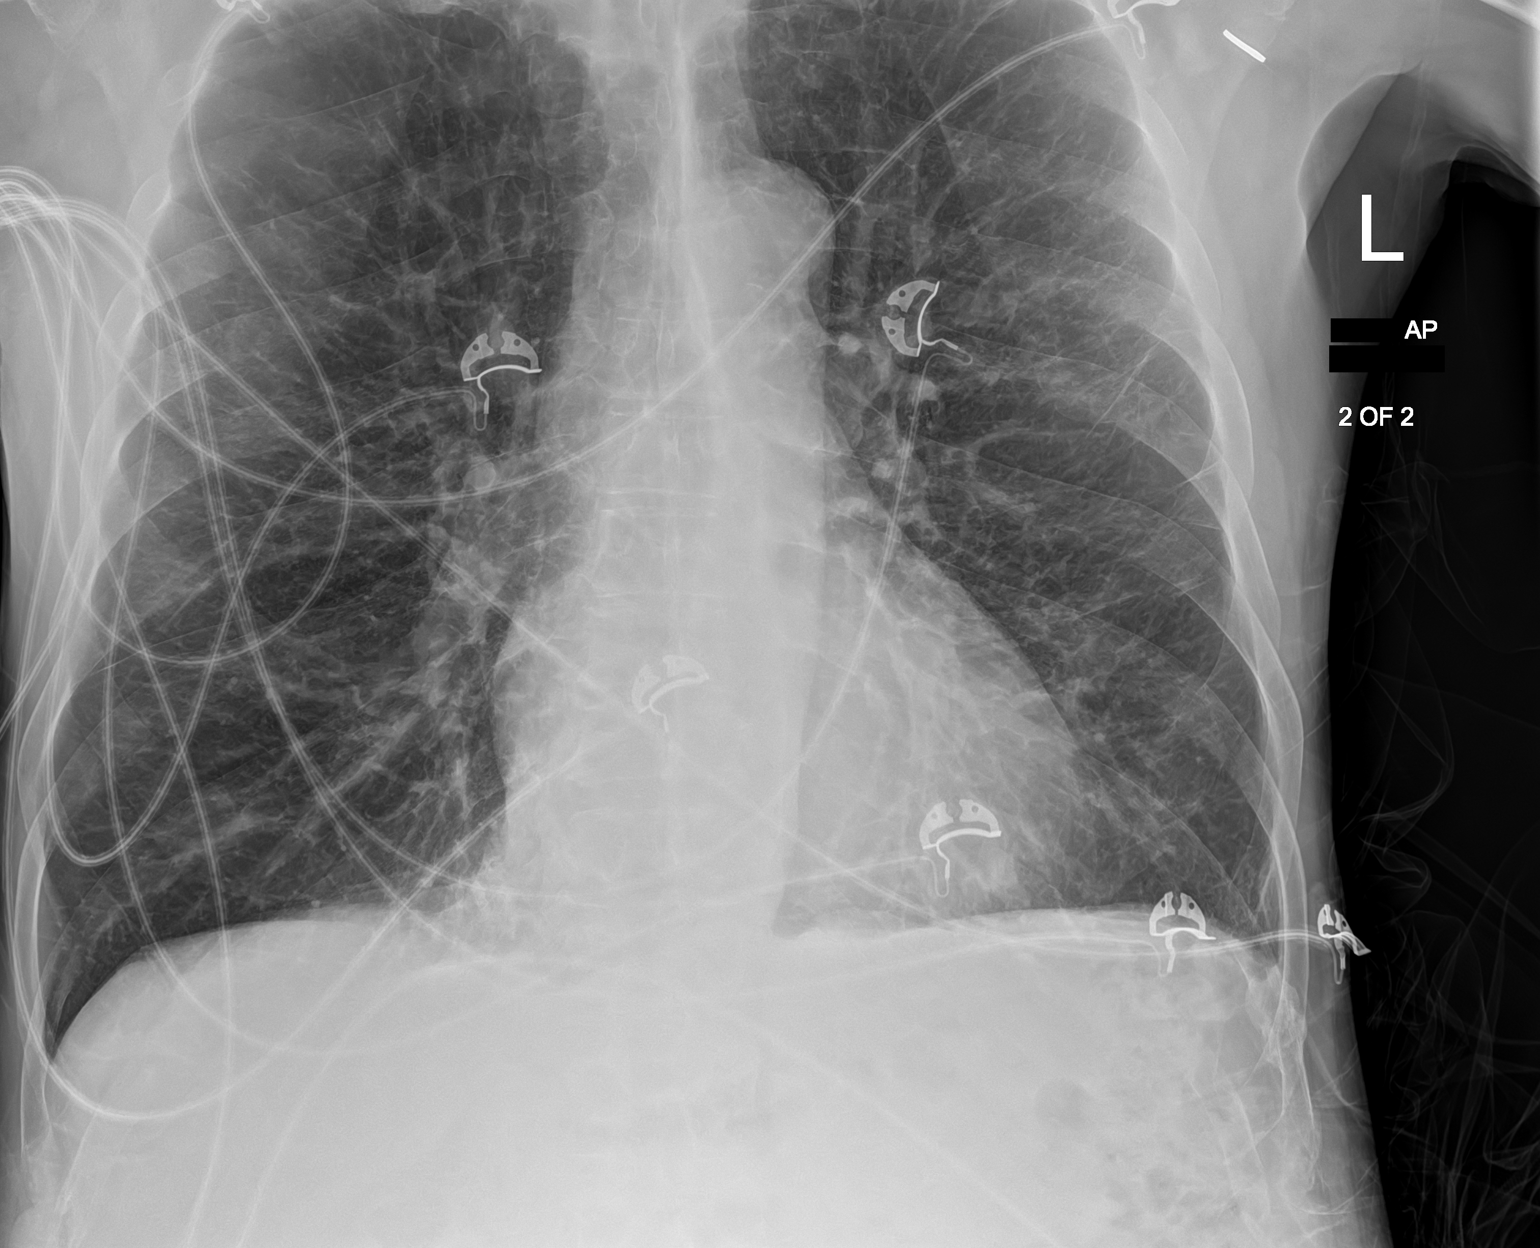

[2 of 2 positions shown; findings below may reference images not displayed]

FINDINGS: Cardiac shadow is stable. Aortic calcifications are noted. The lungs
are hyperinflated bilaterally. Some slight increase in interstitial
markings is seen when compared with the prior exam consistent with
some interstitial edema. No focal infiltrate is noted. Postsurgical
changes are noted in the cervical spine.
IMPRESSION: Mild increase in interstitial edema.

COPD

## 2021-06-03 MED ORDER — MAGNESIUM SULFATE 2 GM/50ML IV SOLN
2.0000 g | Freq: Once | INTRAVENOUS | Status: AC
  Administered 2021-06-03: 2 g via INTRAVENOUS
  Filled 2021-06-03: qty 50

## 2021-06-03 MED ORDER — KETOROLAC TROMETHAMINE 30 MG/ML IJ SOLN
30.0000 mg | Freq: Once | INTRAMUSCULAR | Status: AC
  Administered 2021-06-03: 30 mg via INTRAVENOUS
  Filled 2021-06-03: qty 1

## 2021-06-03 MED ORDER — IPRATROPIUM-ALBUTEROL 0.5-2.5 (3) MG/3ML IN SOLN
3.0000 mL | Freq: Once | RESPIRATORY_TRACT | Status: AC
  Administered 2021-06-03: 3 mL via RESPIRATORY_TRACT
  Filled 2021-06-03: qty 3

## 2021-06-03 MED ORDER — METHYLPREDNISOLONE SODIUM SUCC 125 MG IJ SOLR
60.0000 mg | Freq: Once | INTRAMUSCULAR | Status: AC
  Administered 2021-06-03: 60 mg via INTRAVENOUS
  Filled 2021-06-03: qty 2

## 2021-06-03 MED ORDER — FUROSEMIDE 10 MG/ML IJ SOLN
40.0000 mg | Freq: Once | INTRAMUSCULAR | Status: AC
  Administered 2021-06-03: 40 mg via INTRAVENOUS
  Filled 2021-06-03: qty 4

## 2021-06-03 MED ORDER — IPRATROPIUM-ALBUTEROL 0.5-2.5 (3) MG/3ML IN SOLN
3.0000 mL | Freq: Once | RESPIRATORY_TRACT | Status: AC
  Administered 2021-06-03: 3 mL via RESPIRATORY_TRACT

## 2021-06-03 NOTE — ED Notes (Signed)
EKG unable to be obtained. Pt unable to keep still

## 2021-06-03 NOTE — ED Provider Notes (Signed)
[facility] EMERGENCY DEPARTMENT Provider Note   CSN: [id number] Arrival date & time: 06/03/21  0225     History Chief Complaint  Patient presents with   Chest Pain    Tony Sullivan is a 60 y.o. male.  Patient returns with constant left-sided chest pain that has been ongoing since yesterday afternoon.  Reports left-sided chest pain is constant and worse with palpation and movement.  Denies any fall or injury.  States he was not given a reason why he was hurting yesterday.  He is on 4 L of oxygen chronically for his COPD which is unchanged.  He feels like he is breathing worse than usual and coughing up green and yellow mucus.  He denies any history of cardiac issues.  Denies any radiation of the pain.  States he has numbness in his arm chronically which is unchanged in the left arm.  He was seen for this in July admitted to the hospital for it. Denies any change in his chronic leg swelling.  Feels his abdomen is distended but no vomiting, fever or diarrhea. Denies any history of blood clots.  No blood thinner use.  States compliance with his breathing medications.  The history is provided by the patient.  Chest Pain Associated symptoms: shortness of breath   Associated symptoms: no dizziness, no fever, no headache and no weakness       Past Medical History:  Diagnosis Date   Anxiety    Arthritis    COPD (chronic obstructive pulmonary disease) ([place]) 2004   diagnosed in 2004, no PFT's to date.  Started on home O2 12/2013, after found to be desatting at PCP's office, and referred to pulmonology.   Depression    GERD (gastroesophageal reflux disease)    High blood pressure    Prediabetes    Seasonal allergies    Tobacco dependence     Patient Active Problem List   Diagnosis Date Noted   Numbness and tingling in left arm 05/06/2021   Leukocytosis 05/05/2021   Severe headache 05/05/2021   Hyperlipidemia 05/24/2020   Hypokalemia 05/24/2020   Chest pain 05/23/2020   Bipolar  disorder ([place]) 05/23/2020   HNP (herniated nucleus pulposus), cervical 09/06/2019   Abdominal pain    Poor dentition 11/04/2017   Acute on chronic respiratory failure with hypoxia ([place]) 08/29/2017   ETOH abuse 03/30/2017   Cigarette smoker 03/30/2017   Acute respiratory failure with hypoxia and hypercapnia ([facility]) 03/29/2017   Special screening for malignant neoplasms, colon    Benign neoplasm of ascending colon    Benign neoplasm of descending colon    Hoarseness 08/22/2016   GERD (gastroesophageal reflux disease) 08/22/2016   Atypical chest pain 12/05/2015   Essential hypertension 06/15/2014   Pectoralis muscle strain 01/11/2014   COPD with acute exacerbation ([place]) 01/10/2014   COPD  GOLD III 01/08/2014   Smoker 01/08/2014   Cough 01/08/2014   Chronic respiratory failure with hypoxia ([place]) 01/08/2014    Past Surgical History:  Procedure Laterality Date   ANTERIOR CERVICAL DECOMP/DISCECTOMY FUSION N/A 09/06/2019   Procedure: Anterior Cervical Discecectomy Fusion - Cerivcal Five-Cervical Six;  Surgeon: Kary Kos, MD;  Location: [place];  Service: Neurosurgery;  Laterality: N/A;  Anterior Cervical Discecectomy Fusion - Cerivcal Five-Cervical Six   ANTERIOR CERVICAL DISCECTOMY  09/06/2019   APPENDECTOMY  1980   COLONOSCOPY WITH PROPOFOL N/A 12/10/2016   Procedure: COLONOSCOPY WITH PROPOFOL;  Surgeon: [name], MD;  Location: [facility];  Service: Endoscopy;  Laterality: N/A;  HIP SURGERY Right    SHOULDER ARTHROSCOPY Right        Family History  Problem Relation Age of Onset   Emphysema Mother    Allergies Mother        "everyone in family"   Asthma Mother        "everyone in family"   Heart disease Mother    Clotting disorder Mother    Cancer Mother    Emphysema Father    Allergies Father    Allergies Son    Seizures Brother    Diabetes Brother     Social History   Tobacco Use   Smoking status: Some Days    Packs/day: 0.00    Years: 36.00    Pack years:  0.00    Types: Cigarettes   Smokeless tobacco: Never   Tobacco comments:    smokes 2 cigarettes per day 11/06/2020  Vaping Use   Vaping Use: Never used  Substance Use Topics   Alcohol use: Not Currently    Alcohol/week: 14.0 - 21.0 standard drinks    Types: 14 - 21 Standard drinks or equivalent per week    Comment: 2-3 beers/day, with occasional "binges".  No history of withdrawal   Drug use: Not Currently    Types: Marijuana, Cocaine    Comment: last cocaine use last month    Home Medications Prior to Admission medications   Medication Sig Start Date End Date Taking? Authorizing Provider  albuterol (PROAIR HFA) 108 (90 Base) MCG/ACT inhaler Inhale 2 puffs into the lungs every 4 (four) hours as needed. 03/19/21   Fredia Sorrow, MD  albuterol (PROVENTIL) (2.5 MG/3ML) 0.083% nebulizer solution Take 3 mLs (2.5 mg total) by nebulization every 4 (four) hours as needed for wheezing or shortness of breath. 04/28/18   [name], NP  atorvastatin (LIPITOR) 40 MG tablet TAKE ONE TABLET BY MOUTH DAILY 07/06/20   [name], MD  benztropine (COGENTIN) 0.5 MG tablet Take 0.5 mg by mouth at bedtime.     [provider]  budesonide-formoterol (SYMBICORT) 160-4.5 MCG/ACT inhaler INHALE TWO PUFFS BY MOUTH INTO THE LUNGS TWICE A DAY 03/19/21   Fredia Sorrow, MD  cetirizine (ZYRTEC) 10 MG tablet Take 10 mg by mouth daily.    [provider]  diclofenac Sodium (VOLTAREN) 1 % GEL Apply 2 g topically 4 (four) times daily.    [provider]  dorzolamide (TRUSOPT) 2 % ophthalmic solution Place 1 drop into the left eye 2 (two) times daily. 12/12/20   [provider]  famotidine (PEPCID) 20 MG tablet TAKE 1 TABLET (20 MG TOTAL) BY MOUTH AT BEDTIME. 02/05/21   [name], MD  fluticasone (FLONASE) 50 MCG/ACT nasal spray Place 2 sprays into both nostrils 3 (three) times daily as needed for allergies.     [provider]  furosemide (LASIX) 20 MG tablet  Take 1 tablet (20 mg total) by mouth daily. 05/01/21   [name], MD  hydrochlorothiazide (HYDRODIURIL) 25 MG tablet Take 1 tablet (25 mg total) by mouth daily. 05/23/21   Tolia, Sunit, DO  latanoprost (XALATAN) 0.005 % ophthalmic solution Place 1 drop into both eyes at bedtime. 05/24/19   [provider]  LORazepam (ATIVAN) 1 MG tablet Take 1 tablet (1 mg total) by mouth 3 (three) times daily as needed for anxiety. 02/15/21   Daleen Bo, MD  losartan (COZAAR) 100 MG tablet Take 100 mg by mouth daily.    [provider]  loxapine (  LOXITANE) 10 MG capsule Take 10 mg by mouth at bedtime.     [provider]  megestrol (MEGACE) 40 MG tablet Take 40 mg by mouth daily.    [provider]  mirtazapine (REMERON) 30 MG tablet Take 30 mg by mouth at bedtime.    [provider]  Multiple Vitamin (MULTIVITAMIN) capsule Take 1 capsule by mouth daily.    [provider]  mupirocin cream (BACTROBAN) 2 % Apply 1 application topically 2 (two) times daily. 05/01/21   [name], MD  OXYGEN Inhale 3-4 L into the lungs as directed. 3-4 liters with sleep and exertion as needed for low oxygen    [provider]  pantoprazole (PROTONIX) 40 MG tablet Take 40 mg by mouth daily.    [provider]  polyethylene glycol (MIRALAX / GLYCOLAX) 17 g packet TAKE 17 GRAMS BY MOUTH DAILY 03/09/20   [name], MD  potassium chloride SA (KLOR-CON) 20 MEQ tablet Take 1 tablet (20 mEq total) by mouth 2 (two) times daily. 02/15/21   Daleen Bo, MD  predniSONE (DELTASONE) 10 MG tablet 1-2 TABLETS AS DIRECTED 05/07/21   [name], MD  predniSONE (DELTASONE) 10 MG tablet Take 1 tablet (10 mg total) by mouth daily. 05/07/21   [name], MD  risperiDONE (RISPERDAL) 2 MG tablet Take 2 mg by mouth at bedtime.    [provider]  Tiotropium Bromide Monohydrate (SPIRIVA RESPIMAT) 2.5 MCG/ACT AERS Inhale 2 puffs into the lungs daily. 03/19/21 05/05/21   Fredia Sorrow, MD    Allergies    Penicillins and Levocetirizine  Review of Systems   Review of Systems  Constitutional:  Negative for activity change, appetite change and fever.  HENT:  Negative for congestion and rhinorrhea.   Eyes:  Negative for visual disturbance.  Respiratory:  Positive for chest tightness and shortness of breath.   Cardiovascular:  Positive for chest pain.  Gastrointestinal:  Positive for abdominal distention.  Genitourinary:  Negative for dysuria and hematuria.  Musculoskeletal:  Negative for arthralgias and myalgias.  Skin:  Negative for rash.  Neurological:  Negative for dizziness, weakness and headaches.   all other systems are negative except as noted in the HPI and PMH.   Physical Exam Updated Vital Signs BP (!) 158/107   Pulse 84   Temp 98.3 F (36.8 C) (Oral)   Resp (!) 26   Ht 5\' 11"  (1.803 m)   Wt 76 kg   SpO2 92%   BMI 23.37 kg/m   Physical Exam Vitals and nursing note reviewed.  Constitutional:      General: He is not in acute distress.    Appearance: He is well-developed.     Comments: Speaking in short sentences  HENT:     Head: Normocephalic and atraumatic.     Mouth/Throat:     Pharynx: No oropharyngeal exudate.  Eyes:     Conjunctiva/sclera: Conjunctivae normal.     Pupils: Pupils are equal, round, and reactive to light.  Neck:     Comments: No meningismus. Cardiovascular:     Rate and Rhythm: Normal rate and regular rhythm.     Heart sounds: Normal heart sounds. No murmur heard. Pulmonary:     Effort: Pulmonary effort is normal. No respiratory distress.     Breath sounds: Wheezing present.     Comments:  Diminished with expiratory wheezing bilaterally  Reproducible central chest tender Chest:     Chest wall: Tenderness present.  Abdominal:  Palpations: Abdomen is soft.     Tenderness: There is no abdominal tenderness. There is no guarding or rebound.  Musculoskeletal:        General: No tenderness. Normal  range of motion.     Cervical back: Normal range of motion and neck supple.     Right lower leg: No edema.     Left lower leg: No edema.  Skin:    General: Skin is warm.  Neurological:     Mental Status: He is alert and oriented to person, place, and time.     Cranial Nerves: No cranial nerve deficit.     Motor: No abnormal muscle tone.     Coordination: Coordination normal.     Comments:  5/5 strength throughout. CN 2-12 intact.Equal grip strength.   Psychiatric:        Behavior: Behavior normal.    ED Results / Procedures / Treatments   Labs (all labs ordered are listed, but only abnormal results are displayed) Labs Reviewed  CBC WITH DIFFERENTIAL/PLATELET - Abnormal; Notable for the following components:      Result Value   WBC 13.4 (*)    Neutro Abs 8.7 (*)    Abs Immature Granulocytes 0.10 (*)    All other components within normal limits  BASIC METABOLIC PANEL - Abnormal; Notable for the following components:   Potassium 3.2 (*)    Calcium 8.5 (*)    All other components within normal limits  RESP PANEL BY RT-PCR (FLU A&B, COVID) ARPGX2  D-DIMER, QUANTITATIVE  BRAIN NATRIURETIC PEPTIDE  TROPONIN I (HIGH SENSITIVITY)  TROPONIN I (HIGH SENSITIVITY)    EKG EKG Interpretation  Date/Time:  Sunday June 03 2021 02:57:26 EDT Ventricular Rate:  80 PR Interval:  151 QRS Duration: 85 QT Interval:  401 QTC Calculation: 466 R Axis:   -84 Text Interpretation: Sinus rhythm Consider right atrial enlargement Left anterior fascicular block Anterior infarct, old Minimal ST elevation, lateral leads No significant change was found Confirmed by Ezequiel Essex [phone]) on 06/03/2021 3:01:20 AM  Radiology DG Chest 2 View  Result Date: 06/01/2021 CLINICAL DATA:  Chest pain EXAM: CHEST - 2 VIEW COMPARISON:  04/12/2021 FINDINGS: There is hyperinflation of the lungs compatible with COPD. Heart and mediastinal contours are within normal limits. No focal opacities or effusions. No acute  bony abnormality. IMPRESSION: COPD.  No active disease. Electronically Signed   By: Rolm Baptise M.D.   On: 06/01/2021 17:39    Procedures Procedures   Medications Ordered in ED Medications  ipratropium-albuterol (DUONEB) 0.5-2.5 (3) MG/3ML nebulizer solution 3 mL (has no administration in time range)  methylPREDNISolone sodium succinate (SOLU-MEDROL) 125 mg/2 mL injection 60 mg (has no administration in time range)  magnesium sulfate IVPB 2 g 50 mL (has no administration in time range)  ketorolac (TORADOL) 30 MG/ML injection 30 mg (has no administration in time range)    ED Course  I have reviewed the triage vital signs and the nursing notes.  Pertinent labs & imaging results that were available during my care of the patient were reviewed by me and considered in my medical decision making (see chart for details).    MDM Rules/Calculators/A&P                          Patient returns with constant chest pain or shortness of breath since yesterday.  Work-up was reassuring yesterday he was diagnosed with chest wall pain.  Troponins and D-dimer were negative.  Chest x-ray was negative.  EKG is sinus rhythm and unchanged.  Chest pain is reproducible with palpation of the chest wall.  He is wheezing on exam will be given nebulizers and steroids.  Chest x-ray today shows mild interstitial edema.  Echocardiogram in July showed normal ejection fraction with diastolic dysfunction.  Patient states he is no longer on Lasix.  He does that HCTZ at home. We will give dose in the ED  Troponin negative x2.  D-dimer is negative.  Low suspicion for ACS or pulmonary embolism.  Patient reports chest pain has been ongoing constantly even before his ED visit yesterday. His pain is reproducible and worse with palpation and movement.  Troponins remain negative.  D-dimer is negative.  There is low suspicion for ACS or pulmonary embolism.  Doubt aortic dissection.  COVID testing is negative.  Work of breathing  has improved after nebulizers and steroids.  He was given 1 dose of IV Lasix with good urine output. He has home prednisone which will be continued.   No Increased work of breathing on his home oxygen.  Treat for possible COPD exacerbation and chest wall pain.  Return precautions are discussed Final Clinical Impression(s) / ED Diagnoses Final diagnoses:  Atypical chest pain  COPD exacerbation [facility])    Rx / DC Orders ED Discharge Orders     None        Purvis Sidle, Annie Main, MD 06/03/21 [phone]

## 2021-06-03 NOTE — Discharge Instructions (Addendum)
There is no evidence of heart attack or blood clot in the lung.  He was inhalers as prescribed and take the steroids as prescribed.  Follow-up with your doctor for recheck this week.  Return to the ED with exertional chest pain, worsening shortness of breath, nausea, vomiting, sweating, any other concerns.

## 2021-06-03 NOTE — ED Triage Notes (Signed)
Pt with c/o chest pain and sob. Seen here yesterday for same.

## 2021-06-07 ENCOUNTER — Other Ambulatory Visit: Payer: Self-pay | Admitting: Internal Medicine

## 2021-06-13 ENCOUNTER — Other Ambulatory Visit: Payer: Self-pay | Admitting: Internal Medicine

## 2021-06-13 ENCOUNTER — Other Ambulatory Visit: Payer: Self-pay | Admitting: Cardiology

## 2021-06-14 ENCOUNTER — Telehealth: Payer: Self-pay | Admitting: Internal Medicine

## 2021-06-14 ENCOUNTER — Telehealth: Payer: Self-pay | Admitting: Pulmonary Disease

## 2021-06-14 MED ORDER — PAXLOVID (300/100) 20 X 150 MG & 10 X 100MG PO TBPK: ORAL_TABLET | ORAL | 0 refills | Status: DC

## 2021-06-14 NOTE — Telephone Encounter (Signed)
Called by [place] d/t confusion with Paxlovid order. Last Creatinine = 1.07 and GFR > 60. He is also on Lipitor. Pharmacy instructed to give him the normal dose of Paxlovid (not renal adjusted dose) and to hold his Lipitor while he is on Paxlovid therapy.

## 2021-06-14 NOTE — Telephone Encounter (Signed)
Pt is requesting Prednisone, would you like to refill this?

## 2021-06-15 ENCOUNTER — Ambulatory Visit: Payer: Medicaid Other | Admitting: Internal Medicine

## 2021-06-15 NOTE — Progress Notes (Deleted)
Subjective:   Patient ID: Tony Sullivan, male    DOB: [birth year]  MRN: [id number]    Brief patient profile:  86  yobm active smoker with breathing difficulties since 2004 dx as copd on advair 250 /50 but getting worse since 2014 and placed on 02 for the first time in March 2015 and referred 01/07/2014 to pulmonary clinic by Dr Kennon Holter with GOLD II copd criteria established 02/14/14    History of Present Illness  01/07/2014 1st [facility] office visit/ Tony Sullivan  Chief Complaint  Patient presents with   Advice Only    Referred for COPD, SOB.  Pt c/o SOB, low 02, prod cough with white mucous.  Pt forgot med list, unsure of what all he is taking  still can do a [employer] but uses [employer] parking, assoc with congested coughing esp in am worse in winter. rec Stop advair   Plan A =  Automatic =  symbicort 160 Take 2 puffs first thing in am and then another 2 puffs about 12 hours later. spiriva in am only  Plan B = Backup- Proventil use it only if you can't your breath - ok to use up to every 4 hours if needed Plan C = Nebulizer up to every 4 hours if needed only if you try plan B first and it doesn't wor   07/27/2018  f/u ov/Tony Sullivan re: copd/ did not bring med calendar or all meds/ still smoking / prednisone 20 mg daily  Chief Complaint  Patient presents with   Follow-up    States his breathing is about the same but when it is hot it gets much worse.   Dyspnea:  Across room even on 02 sob unless uses nebs / MMRC4  = sob if tries to leave home or while getting dressed   Cough: clear / rattling Sleeping: < 30 degrees from multiple pillows SABA use: way over using 02: 02 2lpm   rec For cough / congestion > mucinex up to 1200 mg every 12 hours and use the flutter valve as much as you can Try off tudorza and start back on spiriva 2.5  X 2 pffs each am  Call if unable to get back into [facility] rehab  Please schedule a follow up office visit in 6 weeks, call sooner if needed with all  medications /inhalers/ solutions in hand    09/18/2018  f/u ov/Tony Sullivan re:  GOLD III copd / prednisone 20 mg / did not bring med cal as req maint on symb 160/spiriva smi Chief Complaint  Patient presents with   Follow-up    Breathing is doing well. He is using his albuterol inhaler 4-5 per day because he states he has been walking more. He uses his neb 3 to 4 x per wk.    Dyspnea:  Can't do [employer] stopping on 1lpm but doesn't check sats or titrate up  Cough: esp in am x  first hour but mostly white and < a tbsp or two produced  Sleeping: ok flat  SABA use: as above 02: sleeping on 1.5- 2lpm and never above 1lpm walking  rec Try prednisone 10 mg daily to see if you do just as well and if flares then double the dose until better Only use your albuterol as a rescue medication  Please use flutter valve in am to help with the morning congestion     12/21/2018  f/u ov/Tony Sullivan re:  GOLD III copd/ pred 30 mg daily with  floor 10 mg on symb 160/spiriva  Chief Complaint  Patient presents with   Follow-up    Pt c/o sinus pressure off and on for the past wk. He is having some increased SOB this morning.    Dyspnea:  Can do shopping at grocery on 2lpm does not check sats  Cough: usual cough = rattling  Sleeping: bed is flat / pillows up to 30 degrees  SABA use: up to q 6 hours on neb  02: 2lpm  rec Plan A = Automatic = symbicort 160 Take 2 puffs first thing in am and then another 2 puffs about 12 hours later/spiriva 2.5  X 3 puffs each am  Work on inhaler technique Plan B = Backup Only use your albuterol inhaler as a rescue medication  Plan C = Crisis - only use your albuterol nebulizer if you first try Plan B and it fails to help > ok to use the nebulizer up to every 4 hours but if start needing it regularly call for immediate appointment Plan D = Deltasone  Take 2 daily until better then 1 daily    05/13/2019  f/u ov/Tony Sullivan re:  COPD III copd and pred 20 mg bid  Chief Complaint  Patient  presents with   Follow-up    Breathing has been progressively worse. He is still smoking. He is using his proair multiple x per day and neb daily.   Dyspnea:  Walking 2 blocks slow pace on 3-4 doesn't titrate  Cough: minimal esp am still clear  Sleeping: ok propped up 30 degrees  SABA use: way over using as above  02: no using it sitting , up to 4lpm walking, 2.5 hs  rec 02 is 2.5 lpm at bedtime and up to 4lpm POC walking   09/20/2019 televist rec Plan A = Automatic = Always=    symbicort and spiriva Plan B = Backup (to supplement plan A, not to replace it) Only use your albuterol inhaler as a rescue medication  Plan C = Crisis (instead of Plan B but only if Plan B stops working) - only use your albuterol nebulizer if you first try Plan B and it fails to help > ok to use the nebulizer up to every 4 hours but if start needing it regularly call for immediate appointment Plan D = Deltasone - if you need more albuterol especially the nebulizer to get thu the day then take prednisone 10 mg x 2 each am until better then return to 1 day    03/20/2020  f/u ov/Tony Sullivan re:  GOLD III/ prednisone @ 20 mg per day  Chief Complaint  Patient presents with   Follow-up    pt is having sob and using rescue inhakler 5 to 6 times a week  Dyspnea:  Room to room despite 02  Cough: some in ams Sleeping: 30 degrees  SABA uses way too much  02: hs on 2.5  And 4lpm during the day, not titrating  rec Plan A = Automatic = Always=    symbicort and spiriva daily  Plan B = Backup (to supplement plan A, not to replace it) Only use your albuterol inhaler as rescue   Plan C = Crisis (instead of Plan B but only if Plan B stops working) - only use your albuterol nebulizer if you first try Plan B Plan D = Deltasone - if you need more albuterol especially the nebulizer to get thu the day then take prednisone 10 mg x 2 each am  until better then taper to 10 mg daily  - ok to use the middle step of 20 mg alternating with  10 mg daily on even vs odd dates  Ok try albuterol 15 min (one day use the inhaler then next the nebulizer) before an activity that you know would normally  make you short of breath  For cough > mucinex or mucinex dm up to 1200 mg every 12 hours    11/06/2020  f/u ov/[place] office/Tony Sullivan re: GOLD III copd/ 02 dep still on pred at 20 mg daily  Chief Complaint  Patient presents with   Follow-up    Tightness of chest on and off for about 2 weeks   Dyspnea:  Room to room  Cough: min mucoid in am/ resumed smoking  Sleeping:still 30 degrees  SABA use: way too much, not prechallenging as rec  02: 2.5- 4lpm depending on how he feels,not tracking sats though has oxyimetry  Rec Continue pepcid 20 mg at bedtime and be sure to take protonix 40 mg Take 30-60 min before first meal of the day  Make sure you check your oxygen saturation  at your highest level of activity  to be sure it stays over 90%   For cough/ congestion/ tightness / discolored sputum > zpak  Please schedule a follow up visit in 3 months but call sooner if needed   06/15/2021  f/u ov/Tony Sullivan re: GOLD III copd/ 02 dep maint on ***    prednisone @ *** mg/day No chief complaint on file.   Dyspnea:  *** Cough: *** Sleeping: *** SABA use: *** 02: *** Covid status: *** Lung cancer screening: ***   No obvious day to day or daytime variability or assoc excess/ purulent sputum or mucus plugs or hemoptysis or cp or chest tightness, subjective wheeze or overt sinus or hb symptoms.   *** without nocturnal  or early am exacerbation  of respiratory  c/o's or need for noct saba. Also denies any obvious fluctuation of symptoms with weather or environmental changes or other aggravating or alleviating factors except as outlined above   No unusual exposure hx or h/o childhood pna/ asthma or knowledge of premature birth.  Current Allergies, Complete Past Medical History, Past Surgical History, Family History, and Social History  were reviewed in [employer] record.  ROS  The following are not active complaints unless bolded Hoarseness, sore throat, dysphagia, dental problems, itching, sneezing,  nasal congestion or discharge of excess mucus or purulent secretions, ear ache,   fever, chills, sweats, unintended wt loss or wt gain, classically pleuritic or exertional cp,  orthopnea pnd or arm/hand swelling  or leg swelling, presyncope, palpitations, abdominal pain, anorexia, nausea, vomiting, diarrhea  or change in bowel habits or change in bladder habits, change in stools or change in urine, dysuria, hematuria,  rash, arthralgias, visual complaints, headache, numbness, weakness or ataxia or problems with walking or coordination,  change in mood or  memory.        No outpatient medications have been marked as taking for the 06/15/21 encounter (Appointment) with [name], MD.                 Objective:   Physical Exam    06/15/2021    *** 11/06/2020   159 03/20/2020   162 05/13/2019  159  12/21/2018    163      06/15/2014  164  >  09/15/2014 158 >   02/20/2015 >156 03/22/2015 >  07/31/2015   150 > 12/05/2015  161   > 04/01/2016    151 > 07/29/2016  152 >   02/25/2017  153 >  06/05/2017  153 > 11/03/2017   166 > 01/30/2018  167> 03/16/2018  168 > 06/10/2018  167 > 07/27/2018  169 > 09/18/2018 163 >       Vital signs reviewed  06/15/2021  - Note at rest 02 sats  ***% on ***   General appearance:    ***      Mod bar***

## 2021-06-15 NOTE — Telephone Encounter (Signed)
Attempted to contact pharmacy, pharmacy is not yet open.  Will hold in triage and try after 9am.

## 2021-07-10 ENCOUNTER — Telehealth: Payer: Self-pay | Admitting: Internal Medicine

## 2021-07-10 NOTE — Telephone Encounter (Signed)
yes

## 2021-07-10 NOTE — Telephone Encounter (Signed)
Called and spoke with patient. He is aware that MW is ok with the letter. The letter has been typed. Will have MW to sign tomorrow and fax.   Nothing further needed at time of call.

## 2021-07-10 NOTE — Telephone Encounter (Signed)
Patient called stating that he needed a letter sent to social services stating that he is on Oxygen to avoid having his electricity cut off.  Dr. [name] is it ok to send letter for the patient?  Thank you

## 2021-07-11 NOTE — Telephone Encounter (Signed)
Letter signed, faxed to [employer] to the number that pt provided

## 2021-07-11 NOTE — Telephone Encounter (Signed)
Pt states ss did not rec've fax and ask that it be refaxed or emailed. Fax [phone]

## 2021-07-18 ENCOUNTER — Other Ambulatory Visit: Payer: Self-pay | Admitting: Cardiology

## 2021-07-18 ENCOUNTER — Other Ambulatory Visit: Payer: Self-pay | Admitting: Internal Medicine

## 2021-07-18 ENCOUNTER — Telehealth: Payer: Self-pay | Admitting: Internal Medicine

## 2021-07-18 MED ORDER — AZITHROMYCIN 250 MG PO TABS
ORAL_TABLET | ORAL | 0 refills | Status: DC
Start: 2021-07-18 — End: 2021-07-25

## 2021-07-18 NOTE — Telephone Encounter (Signed)
Zpak and remember albuterol neb is up to q 4h prn in this setting

## 2021-07-18 NOTE — Telephone Encounter (Signed)
Spoke with the pt  He is c/o increased SOB for 1 month  He says he feels like it may be coming from not going to rehab for a while  He is coughing up some green sputum  Has minimal wheezing  He is using pred 20 mg daily, as well as his spiriva, symbicort, albuterol inh and neb, pepcid and protonix  I scheduled him to see Beth in [place] next wk - he said needed 5 days notice for transportation  Dr [name]- are there any recs for him in the meantime  Please advise thanks!  Allergies  Allergen Reactions   Penicillins Anaphylaxis    Has patient had a PCN reaction causing immediate rash, facial/tongue/throat swelling, SOB or lightheadedness with hypotension: Yes Has patient had a PCN reaction causing severe rash involving mucus membranes or skin necrosis: No Has patient had a PCN reaction that required hospitalization No Has patient had a PCN reaction occurring within the last 10 years: No If all of the above answers are "NO", then may proceed with Cephalosporin use.    Levocetirizine Other (See Comments)    Muscle cramps

## 2021-07-18 NOTE — Telephone Encounter (Signed)
Called and spoke with patient to let him know the recs from Dr. [name]. He asked for RX to be sent to [facility] in [place] because [facility] does not deliver on Tuesdays. RX has been sent to requested pharmacy. Nothing further needed at this time.

## 2021-07-19 ENCOUNTER — Other Ambulatory Visit: Payer: Self-pay | Admitting: Internal Medicine

## 2021-07-19 ENCOUNTER — Telehealth: Payer: Self-pay | Admitting: Internal Medicine

## 2021-07-19 NOTE — Telephone Encounter (Signed)
Called and spoke with patient. He is needed a refill for his Albuterol inhaler. Refill has already been sent in. Patient is aware.  Nothing further needed at this time.

## 2021-07-25 ENCOUNTER — Encounter: Payer: Self-pay | Admitting: Primary Care

## 2021-07-25 ENCOUNTER — Other Ambulatory Visit: Payer: Self-pay

## 2021-07-25 ENCOUNTER — Ambulatory Visit (INDEPENDENT_AMBULATORY_CARE_PROVIDER_SITE_OTHER): Payer: Medicaid Other | Admitting: Primary Care

## 2021-07-25 VITALS — BP 116/80 | HR 84 | Temp 98.0°F | Ht 71.0 in | Wt 155.0 lb

## 2021-07-25 DIAGNOSIS — J9611 Chronic respiratory failure with hypoxia: Secondary | ICD-10-CM | POA: Diagnosis not present

## 2021-07-25 DIAGNOSIS — R5381 Other malaise: Secondary | ICD-10-CM

## 2021-07-25 DIAGNOSIS — J449 Chronic obstructive pulmonary disease, unspecified: Secondary | ICD-10-CM | POA: Diagnosis not present

## 2021-07-25 MED ORDER — BREZTRI AEROSPHERE 160-9-4.8 MCG/ACT IN AERO: 2.0000 | INHALATION_SPRAY | Freq: Two times a day (BID) | RESPIRATORY_TRACT | 0 refills | Status: DC

## 2021-07-25 MED ORDER — BREZTRI AEROSPHERE 160-9-4.8 MCG/ACT IN AERO: 2.0000 | INHALATION_SPRAY | Freq: Two times a day (BID) | RESPIRATORY_TRACT | 5 refills | Status: DC

## 2021-07-25 NOTE — Patient Instructions (Addendum)
Recommendations: - Stop Symbicort and Spiriva  - Start SunGard- take two puffs morning and evening (use with spacer and rinse mouth after use)  - Take 20mg  prednisone once daily, alternative 10mg  every other day   Referral: - Home eval PT re: deconditioning (ordered)   Follow-up: - 3 months with Dr. [name] or sooner if needed

## 2021-07-25 NOTE — Progress Notes (Signed)
@Patient  ID: Tony Sullivan, male    DOB: 1961-04-07, 60 y.o.   MRN: [id number]  Chief Complaint  Patient presents with   Follow-up    Patient reports that he feels tight in his chest, and increased shortness of breath in the last few weeks.    Referring provider: [name], FNP  HPI: 60 year old male, former smoker quit in June 2021. PMH significant for COPD GOLD III, chronic respiratory failure. Patient of Dr. [name], last seen 11/06/20.   07/25/2021- interim hx Patient presents today for acute visit. Patient reports increased shortness of breath over the last several months. Feels weather changes have affected his breathing. He is taking 10mg  prednisone twice daily. He takes Symbicort and Spiriva as prescribed. He is over using Proair rescue inhaler. Marked activity intolerance d.t his underlying COPD, respiratory failure and physical deconditioning. He uses wheelchair to get around. He can perform limited ADLS.   Dyspnea: 50 ft  Cough: Morning cough Sleeping: 30 degrees SABA: too much O2: uses 3-4L during the day/ 3L at night    Allergies  Allergen Reactions   Penicillins Anaphylaxis    Has patient had a PCN reaction causing immediate rash, facial/tongue/throat swelling, SOB or lightheadedness with hypotension: Yes Has patient had a PCN reaction causing severe rash involving mucus membranes or skin necrosis: No Has patient had a PCN reaction that required hospitalization No Has patient had a PCN reaction occurring within the last 10 years: No If all of the above answers are "NO", then may proceed with Cephalosporin use.    Levocetirizine Other (See Comments)    Muscle cramps    Immunization History  Administered Date(s) Administered   Influenza Split 08/09/2013, 08/03/2014   Influenza Whole 07/28/2017   Influenza,inj,Quad PF,6+ Mos 07/31/2015, 07/29/2016, 06/24/2018, 08/12/2018, 06/07/2019, 08/15/2020   Moderna Sars-Covid-2 Vaccination 03/19/2020, 04/26/2020,  09/17/2020   Pneumococcal Conjugate-13 07/31/2015   Pneumococcal Polysaccharide-23 08/03/2014, 04/03/2021   Tdap 07/30/2016   Zoster Recombinat (Shingrix) 04/03/2021    Past Medical History:  Diagnosis Date   Anxiety    Arthritis    COPD (chronic obstructive pulmonary disease) ([place]) 2004   diagnosed in 2004, no PFT's to date.  Started on home O2 12/2013, after found to be desatting at PCP's office, and referred to pulmonology.   Depression    GERD (gastroesophageal reflux disease)    High blood pressure    Prediabetes    Seasonal allergies    Tobacco dependence     Tobacco History: Social History   Tobacco Use  Smoking Status Some Days   Packs/day: 0.00   Years: 36.00   Pack years: 0.00   Types: Cigarettes  Smokeless Tobacco Never  Tobacco Comments   smokes 2 cigarettes per day 11/06/2020   Ready to quit: Not Answered Counseling given: Not Answered Tobacco comments: smokes 2 cigarettes per day 11/06/2020   Outpatient Medications Prior to Visit  Medication Sig Dispense Refill   albuterol (PROVENTIL) (2.5 MG/3ML) 0.083% nebulizer solution Take 3 mLs (2.5 mg total) by nebulization every 4 (four) hours as needed for wheezing or shortness of breath. 75 mL 3   atorvastatin (LIPITOR) 40 MG tablet TAKE ONE TABLET BY MOUTH DAILY 30 tablet 0   benztropine (COGENTIN) 0.5 MG tablet Take 0.5 mg by mouth at bedtime.      cetirizine (ZYRTEC) 10 MG tablet Take 10 mg by mouth daily.     diclofenac Sodium (VOLTAREN) 1 % GEL Apply 2 g topically 4 (four) times daily.  dorzolamide (TRUSOPT) 2 % ophthalmic solution Place 1 drop into the left eye 2 (two) times daily.     famotidine (PEPCID) 20 MG tablet TAKE 1 TABLET (20 MG TOTAL) BY MOUTH AT BEDTIME. 30 tablet 8   fluticasone (FLONASE) 50 MCG/ACT nasal spray Place 2 sprays into both nostrils 3 (three) times daily as needed for allergies.      furosemide (LASIX) 20 MG tablet Take 1 tablet (20 mg total) by mouth daily. 5 tablet 0    hydrochlorothiazide (HYDRODIURIL) 25 MG tablet TAKE ONE TABLET BY MOUTH DAILY 30 tablet 0   latanoprost (XALATAN) 0.005 % ophthalmic solution Place 1 drop into both eyes at bedtime.     LORazepam (ATIVAN) 1 MG tablet Take 1 tablet (1 mg total) by mouth 3 (three) times daily as needed for anxiety. 15 tablet 0   losartan (COZAAR) 100 MG tablet Take 100 mg by mouth daily.     loxapine (LOXITANE) 10 MG capsule Take 10 mg by mouth at bedtime.      megestrol (MEGACE) 40 MG tablet Take 40 mg by mouth daily.     mirtazapine (REMERON) 30 MG tablet Take 30 mg by mouth at bedtime.     Multiple Vitamin (MULTIVITAMIN) capsule Take 1 capsule by mouth daily.     mupirocin cream (BACTROBAN) 2 % Apply 1 application topically 2 (two) times daily. 15 g 0   OXYGEN Inhale 3-4 L into the lungs as directed. 3-4 liters with sleep and exertion as needed for low oxygen     pantoprazole (PROTONIX) 40 MG tablet Take 40 mg by mouth daily.     polyethylene glycol (MIRALAX / GLYCOLAX) 17 g packet TAKE 17 GRAMS BY MOUTH DAILY 14 each 1   potassium chloride SA (KLOR-CON) 20 MEQ tablet Take 1 tablet (20 mEq total) by mouth 2 (two) times daily. 20 tablet 0   predniSONE (DELTASONE) 10 MG tablet TAKE ONE TO TWO TABLETS BY MOUTH DAILY AS DIRECTED 60 tablet 0   PROAIR HFA 108 (90 Base) MCG/ACT inhaler INHALE 2 PUFFS INTO THE LUNGS UP TO EVERY 4 HOURS IF NEEDED FOR SHORTNESS OF BREATH 34 g 1   risperiDONE (RISPERDAL) 2 MG tablet Take 2 mg by mouth at bedtime.     budesonide-formoterol (SYMBICORT) 160-4.5 MCG/ACT inhaler INHALE TWO PUFFS BY MOUTH INTO THE LUNGS TWICE A DAY 30.6 g 11   Tiotropium Bromide Monohydrate (SPIRIVA RESPIMAT) 2.5 MCG/ACT AERS Inhale 2 puffs into the lungs daily. 12 g 11   azithromycin (ZITHROMAX) 250 MG tablet Take 2 tablets first day then 1 tablet daily until finished. (Patient not taking: Reported on 07/25/2021) 6 tablet 0   nirmatrelvir & ritonavir (PAXLOVID, 300/100,) 20 x 150 MG & 10 x 100MG  TBPK As directed  on box (Patient not taking: Reported on 07/25/2021) 20 each 0   predniSONE (DELTASONE) 10 MG tablet TAKE 1 TABLET(10 MG) BY MOUTH DAILY (Patient not taking: Reported on 07/25/2021) 60 tablet 0   No facility-administered medications prior to visit.    Review of Systems  Review of Systems  Constitutional: Negative.   Respiratory:  Positive for shortness of breath.   Cardiovascular: Negative.   Neurological:  Positive for weakness.    Physical Exam  BP 116/80 (BP Location: Left Arm, Patient Position: Sitting, Cuff Size: Normal)   Pulse 84   Temp 98 F (36.7 C) (Oral)   Ht 5\' 11"  (1.803 m)   Wt 155 lb (70.3 kg)   SpO2 95%   BMI 21.62  kg/m  Physical Exam Constitutional:      Appearance: Normal appearance.  HENT:     Head: Normocephalic and atraumatic.  Cardiovascular:     Rate and Rhythm: Normal rate and regular rhythm.  Pulmonary:     Effort: Pulmonary effort is normal.     Breath sounds: Normal breath sounds. No wheezing.     Comments: Diminished Musculoskeletal:        General: Normal range of motion.     Cervical back: Normal range of motion and neck supple.  Skin:    General: Skin is warm.  Neurological:     General: No focal deficit present.     Mental Status: He is alert and oriented to person, place, and time. Mental status is at baseline.  Psychiatric:        Mood and Affect: Mood normal.        Behavior: Behavior normal.        Thought Content: Thought content normal.        Judgment: Judgment normal.     Lab Results:  CBC    Component Value Date/Time   WBC 13.4 (H) 06/03/2021 0311   RBC 4.54 06/03/2021 0311   HGB 14.3 06/03/2021 0311   HCT 45.3 06/03/2021 0311   PLT 284 06/03/2021 0311   MCV 99.8 06/03/2021 0311   MCH 31.5 06/03/2021 0311   MCHC 31.6 06/03/2021 0311   RDW 14.7 06/03/2021 0311   LYMPHSABS 3.5 06/03/2021 0311   MONOABS 0.9 06/03/2021 0311   EOSABS 0.1 06/03/2021 0311   BASOSABS 0.1 06/03/2021 0311    BMET    Component Value  Date/Time   NA 138 06/03/2021 0311   NA 144 03/17/2020 1349   K 3.2 (L) 06/03/2021 0311   CL 102 06/03/2021 0311   CO2 30 06/03/2021 0311   GLUCOSE 90 06/03/2021 0311   BUN 11 06/03/2021 0311   BUN 11 03/17/2020 1349   CREATININE 1.00 06/03/2021 0311   CALCIUM 8.5 (L) 06/03/2021 0311   GFRNONAA >60 06/03/2021 0311   GFRAA >60 07/18/2020 0317    BNP    Component Value Date/Time   BNP 41.0 06/03/2021 0311    ProBNP    Component Value Date/Time   PROBNP 29.2 07/09/2014 0955    Imaging: No results found.   Assessment & Plan:   COPD  GOLD III - Patient reports increased dyspnea symptoms over the last several week/month. Relates to weather change. No other significant respiratory symptoms. He is compliant with Symb 160 + Spiriva and has been taking 20mg  prednisone daily. Recommend changing maintenance inhaler to SunGard. Advised he take 20mg  prednisone daily, alternating with 10mg  daily. He could benefit from some physical therapy. FU in 3 months with Dr. [name].   Physical deconditioning - Refer physical therapy eval and treat    [name], NP 07/26/2021

## 2021-07-26 DIAGNOSIS — R5381 Other malaise: Secondary | ICD-10-CM | POA: Insufficient documentation

## 2021-07-26 NOTE — Assessment & Plan Note (Signed)
-   Refer physical therapy eval and treat

## 2021-07-26 NOTE — Assessment & Plan Note (Addendum)
-   Patient reports increased dyspnea symptoms over the last several week/month. Relates to weather change. No other significant respiratory symptoms. He is compliant with Symb 160 + Spiriva and has been taking 20mg  prednisone daily. Recommend changing maintenance inhaler to SunGard. Advised he take 20mg  prednisone daily, alternating with 10mg  daily. He could benefit from some physical therapy. FU in 3 months with Dr. [name].

## 2021-07-31 ENCOUNTER — Telehealth: Payer: Self-pay | Admitting: Internal Medicine

## 2021-07-31 NOTE — Telephone Encounter (Signed)
Called and spoke with Deweese with [facility] [employer] Initiated the [place] for the [place] for the pt.    This had to be sent over to the pharmacy for review.  It can take up to 24-48 hours to hear back.    Reference # for the call is [id number] PA# [id number]

## 2021-08-02 NOTE — Telephone Encounter (Signed)
Call made to [facility], PA still pending per rep.

## 2021-08-06 ENCOUNTER — Telehealth: Payer: Self-pay | Admitting: Internal Medicine

## 2021-08-06 NOTE — Telephone Encounter (Signed)
Called and spoke with pt and he stated that his insurance is requiring a PA on the [place].       Called [facility] [employer] and the Judithann Sauger has been approved from 08/06/2021 - 08/01/2022.  Ref #  [id number]  Approval [id number]  Called the pharmacy and they did get this to go through.  Nothing further is needed.

## 2021-08-10 NOTE — Telephone Encounter (Signed)
Created error.

## 2021-08-13 ENCOUNTER — Other Ambulatory Visit: Payer: Self-pay | Admitting: Cardiology

## 2021-08-13 ENCOUNTER — Other Ambulatory Visit: Payer: Self-pay | Admitting: Internal Medicine

## 2021-08-13 ENCOUNTER — Telehealth: Payer: Self-pay | Admitting: Internal Medicine

## 2021-08-13 NOTE — Telephone Encounter (Signed)
I have called and LM on VM for Tony Sullivan with [facility].   Pt was last seen on 07/25/2021 by BW.

## 2021-08-14 NOTE — Telephone Encounter (Signed)
See phone note from 08/06/21. Will close this encounter.

## 2021-09-03 NOTE — Telephone Encounter (Signed)
Called Anne Ng and there was no answer-LMTCB

## 2021-09-03 NOTE — Telephone Encounter (Signed)
Was this ever taken care of?

## 2021-09-07 ENCOUNTER — Emergency Department (HOSPITAL_COMMUNITY): Payer: Medicaid Other

## 2021-09-07 ENCOUNTER — Emergency Department (HOSPITAL_COMMUNITY)
Admission: EM | Admit: 2021-09-07 | Discharge: 2021-09-07 | Disposition: A | Discharge status: Home / Self Care | Payer: Medicaid Other | Attending: Emergency Medicine | Admitting: Emergency Medicine

## 2021-09-07 DIAGNOSIS — I10 Essential (primary) hypertension: Secondary | ICD-10-CM | POA: Diagnosis not present

## 2021-09-07 DIAGNOSIS — R0789 Other chest pain: Secondary | ICD-10-CM | POA: Diagnosis not present

## 2021-09-07 DIAGNOSIS — F17210 Nicotine dependence, cigarettes, uncomplicated: Secondary | ICD-10-CM | POA: Diagnosis not present

## 2021-09-07 DIAGNOSIS — Z79899 Other long term (current) drug therapy: Secondary | ICD-10-CM | POA: Insufficient documentation

## 2021-09-07 DIAGNOSIS — R52 Pain, unspecified: Secondary | ICD-10-CM

## 2021-09-07 DIAGNOSIS — J449 Chronic obstructive pulmonary disease, unspecified: Secondary | ICD-10-CM | POA: Diagnosis not present

## 2021-09-07 DIAGNOSIS — T17308A Unspecified foreign body in larynx causing other injury, initial encounter: Secondary | ICD-10-CM

## 2021-09-07 DIAGNOSIS — R0989 Other specified symptoms and signs involving the circulatory and respiratory systems: Secondary | ICD-10-CM | POA: Diagnosis not present

## 2021-09-07 IMAGING — DX DG RIBS 2V*R*
3 series · 3 of 3 positions shown · non-contrast
Comparison: Same day chest radiograph

CLINICAL DATA: Choking episode

EXAM:
RIGHT RIBS - 2 VIEW

[rib pa (1 of 2)]
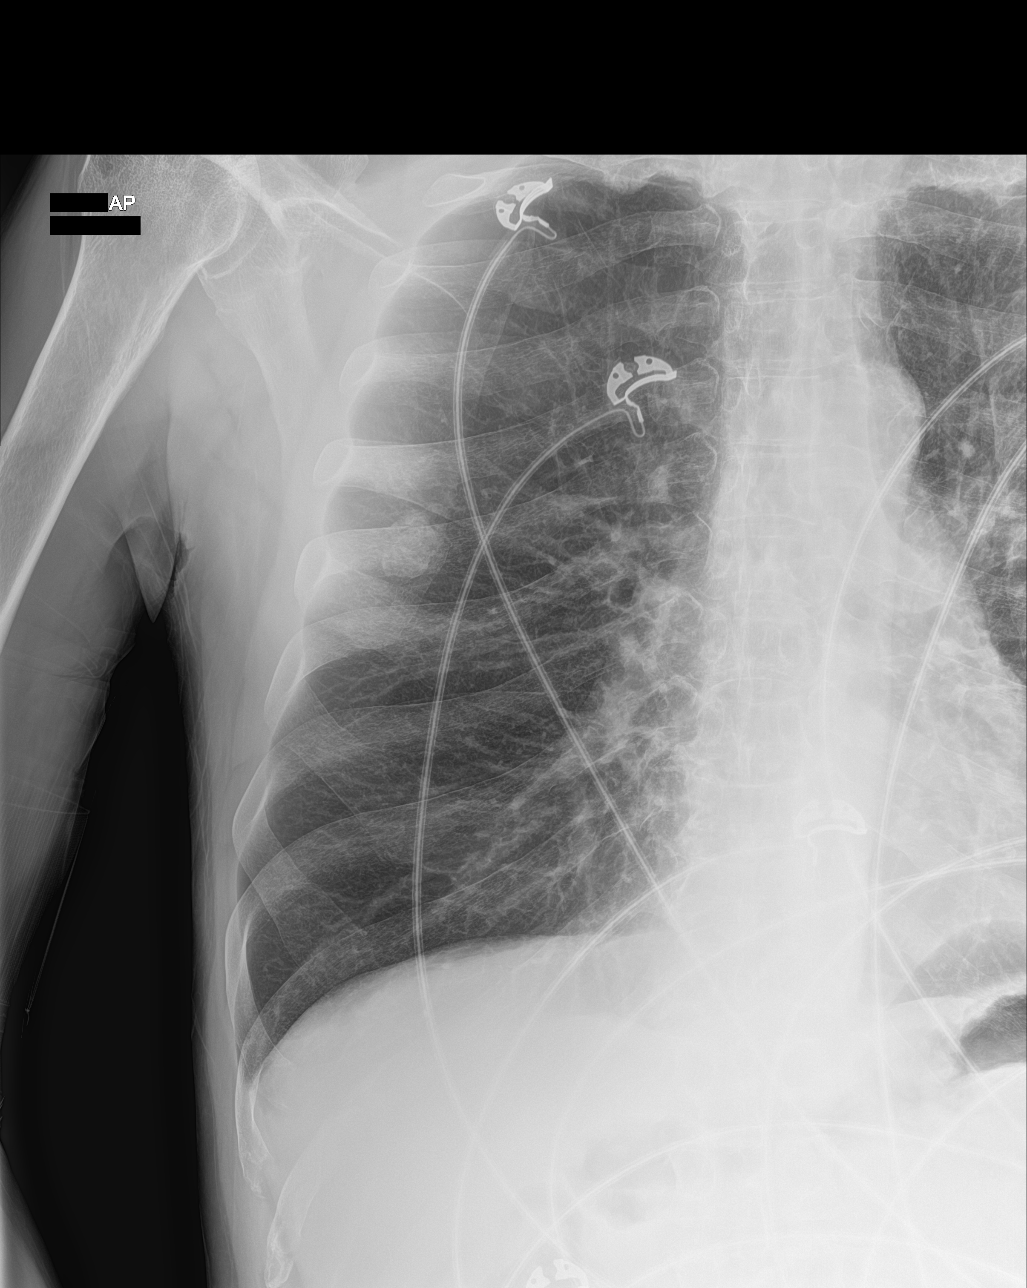

[rib obl]
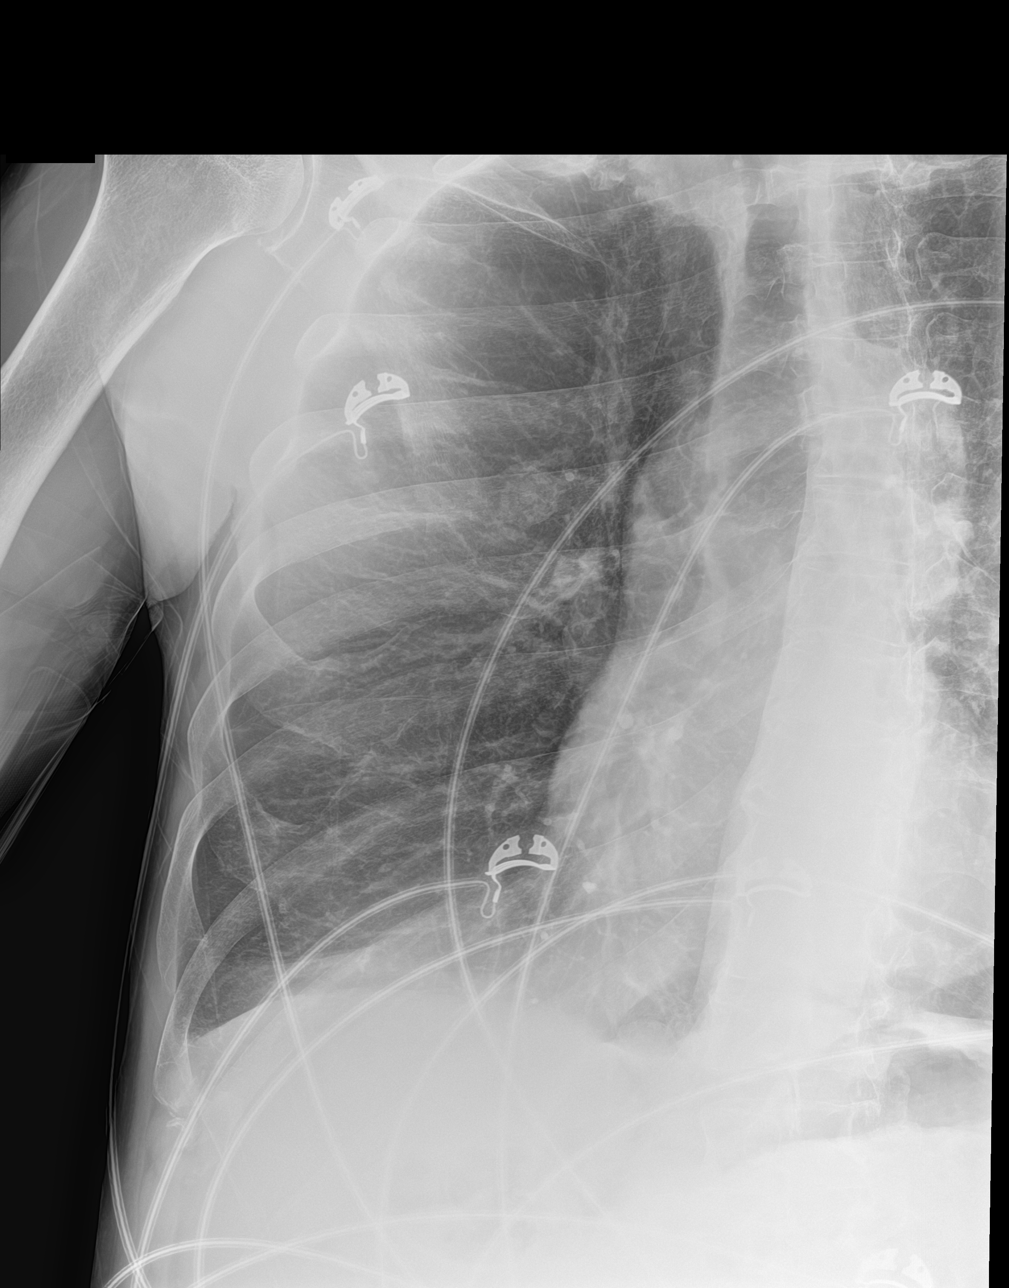

[rib pa (2 of 2)]
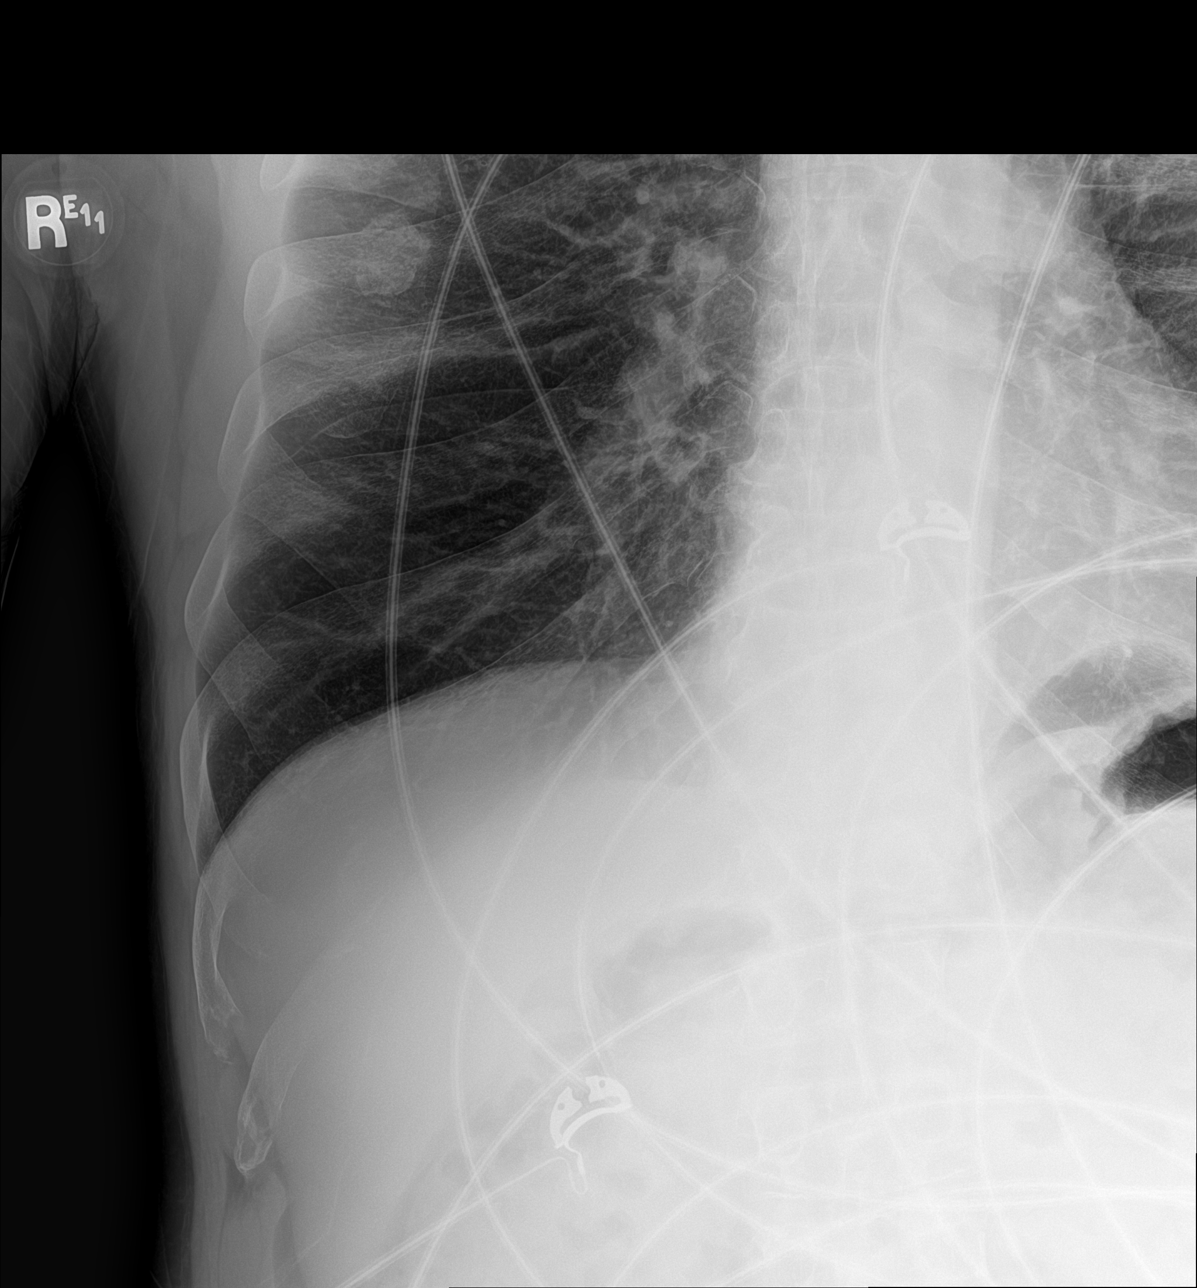

[3 of 3 positions shown; findings below may reference images not displayed]

FINDINGS: There is a subacute, partially callused fracture of the posterior
right sixth rib corresponding to nodular opacity seen on comparison
chest radiograph. No acute appearing fractures. Emphysema.
IMPRESSION: There is a subacute, partially callused fracture of the posterior
right sixth rib corresponding to nodular opacity seen on comparison
chest radiograph.

## 2021-09-07 IMAGING — DX DG CHEST 1V PORT
2 series · 2 of 2 positions shown · non-contrast
Comparison: [DATE]

CLINICAL DATA: Shortness of breath

EXAM:
PORTABLE CHEST 1 VIEW

[chest ap (1 of 2)]
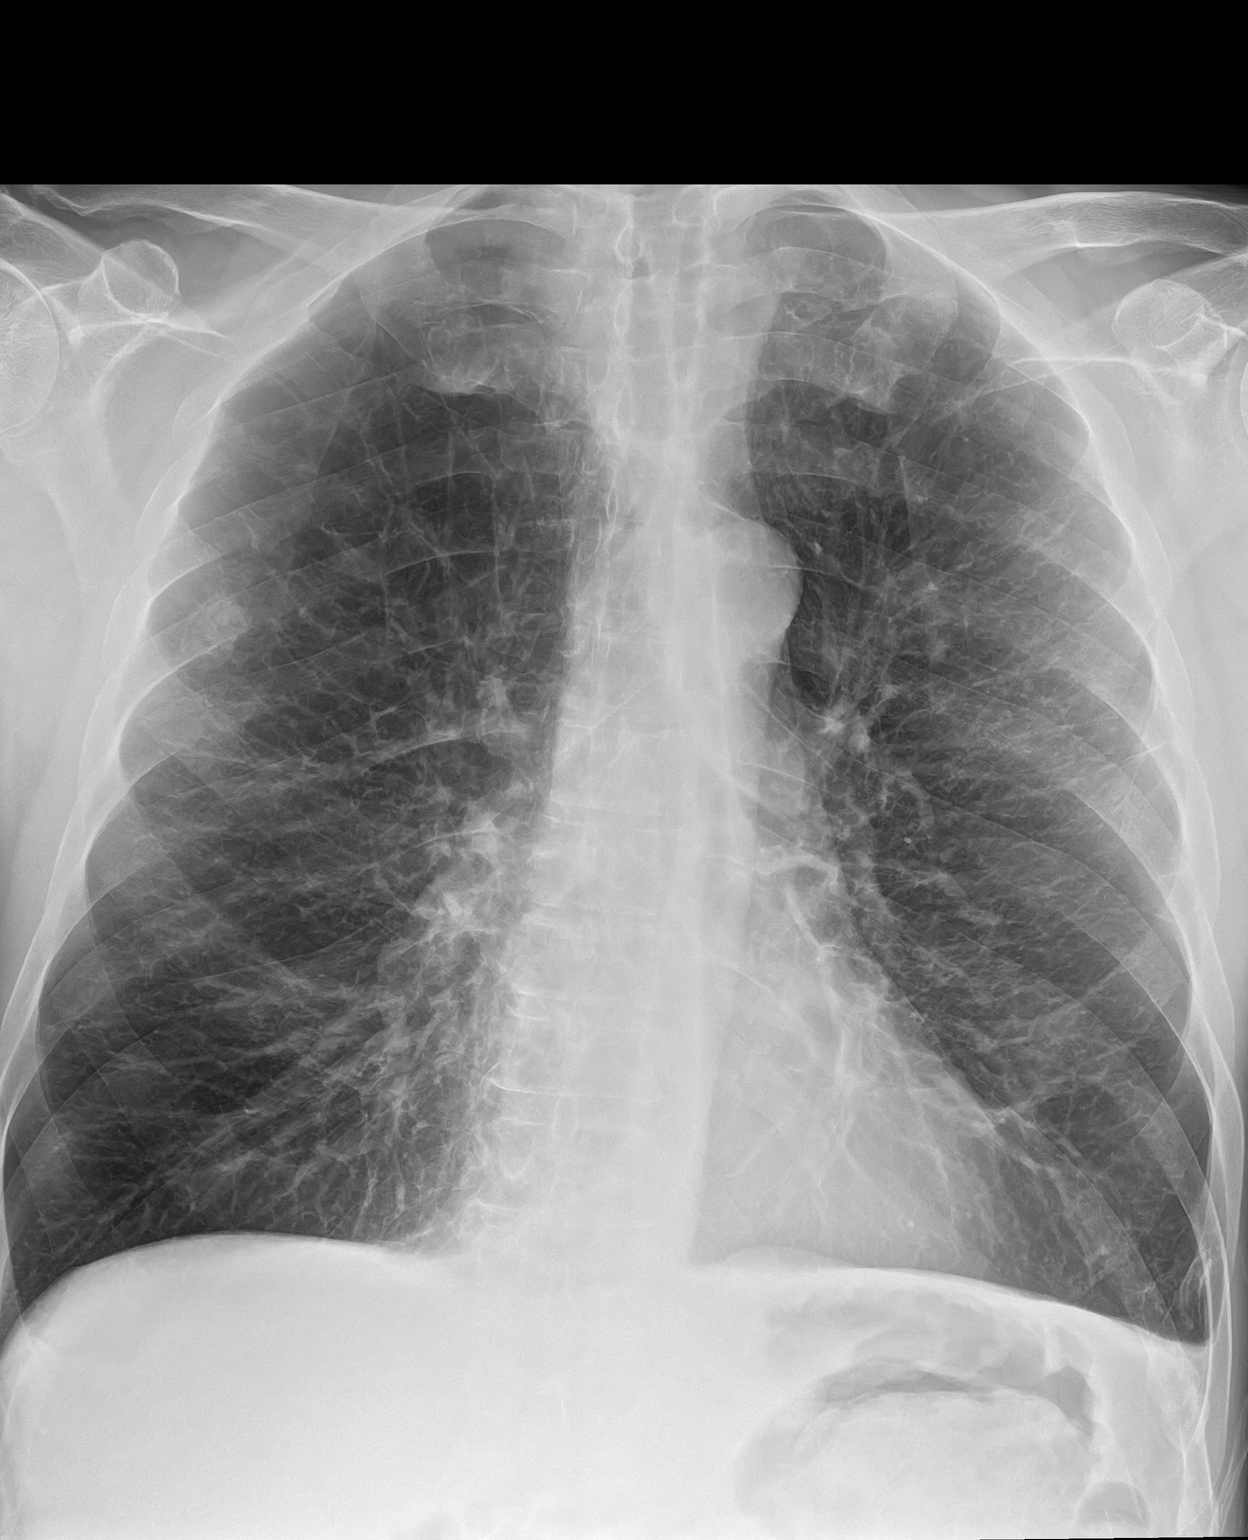

[chest ap (2 of 2)]
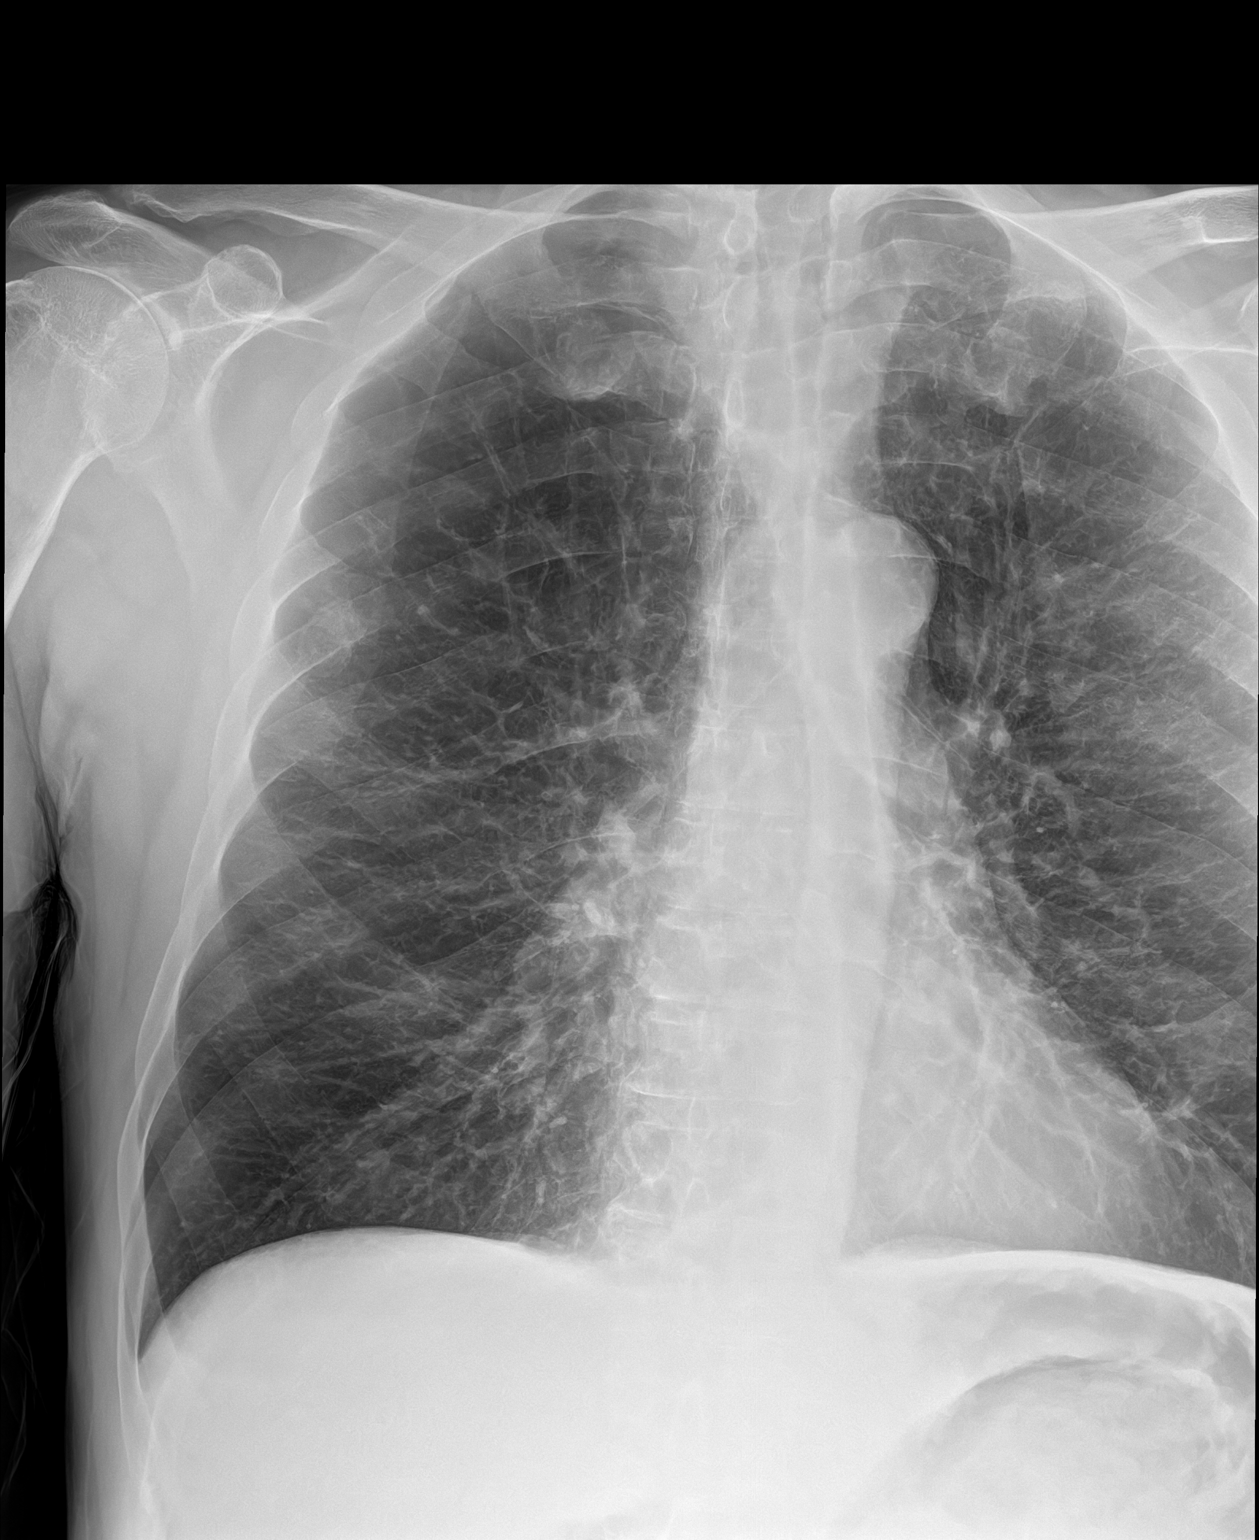

[2 of 2 positions shown; findings below may reference images not displayed]

FINDINGS: Nodular density over the right mid lung measuring 2 cm. There is no
edema, consolidation, effusion, or pneumothorax. Normal heart size
and mediastinal contours.
IMPRESSION: 1. Nodular density over the right chest favors subacute rib fracture
and callus, but need confirmatory imaging with rib series or CT.
2. COPD.

## 2021-09-07 MED ORDER — ALBUTEROL SULFATE HFA 108 (90 BASE) MCG/ACT IN AERS
2.0000 | INHALATION_SPRAY | RESPIRATORY_TRACT | Status: DC | PRN
  Administered 2021-09-07: 2 via RESPIRATORY_TRACT
  Filled 2021-09-07: qty 6.7

## 2021-09-07 NOTE — ED Provider Notes (Signed)
[facility] EMERGENCY DEPARTMENT Provider Note   CSN: [id number] Arrival date & time: 09/07/21  1028     History Chief Complaint  Patient presents with   Aspiration    Tony Sullivan is a 60 y.o. male.  HPI Patient with history of COPD.  On chronic oxygen.  States that he was eating today and choked on some sausage.  States felt like it went the wrong way in his throat.  After arriving to the ER states that he felt as if he coughed it up.  Still mild chest tightness.  States that has been going on the last few days.  Occasional cough but that is really unchanged.  No fevers.  Slight tingling in his throat.    Past Medical History:  Diagnosis Date   Anxiety    Arthritis    COPD (chronic obstructive pulmonary disease) ([place]) 2004   diagnosed in 2004, no PFT's to date.  Started on home O2 12/2013, after found to be desatting at PCP's office, and referred to pulmonology.   Depression    GERD (gastroesophageal reflux disease)    High blood pressure    Prediabetes    Seasonal allergies    Tobacco dependence     Patient Active Problem List   Diagnosis Date Noted   Physical deconditioning 07/26/2021   Numbness and tingling in left arm 05/06/2021   Leukocytosis 05/05/2021   Severe headache 05/05/2021   Hyperlipidemia 05/24/2020   Hypokalemia 05/24/2020   Chest pain 05/23/2020   Bipolar disorder ([place]) 05/23/2020   HNP (herniated nucleus pulposus), cervical 09/06/2019   Abdominal pain    Poor dentition 11/04/2017   Acute on chronic respiratory failure with hypoxia ([place]) 08/29/2017   ETOH abuse 03/30/2017   Cigarette smoker 03/30/2017   Acute respiratory failure with hypoxia and hypercapnia ([facility]) 03/29/2017   Special screening for malignant neoplasms, colon    Benign neoplasm of ascending colon    Benign neoplasm of descending colon    Hoarseness 08/22/2016   GERD (gastroesophageal reflux disease) 08/22/2016   Atypical chest pain 12/05/2015   Essential hypertension  06/15/2014   Pectoralis muscle strain 01/11/2014   COPD with acute exacerbation ([place]) 01/10/2014   COPD  GOLD III 01/08/2014   Smoker 01/08/2014   Cough 01/08/2014   Chronic respiratory failure with hypoxia ([place]) 01/08/2014    Past Surgical History:  Procedure Laterality Date   ANTERIOR CERVICAL DECOMP/DISCECTOMY FUSION N/A 09/06/2019   Procedure: Anterior Cervical Discecectomy Fusion - Cerivcal Five-Cervical Six;  Surgeon: Kary Kos, MD;  Location: [facility];  Service: Neurosurgery;  Laterality: N/A;  Anterior Cervical Discecectomy Fusion - Cerivcal Five-Cervical Six   ANTERIOR CERVICAL DISCECTOMY  09/06/2019   APPENDECTOMY  1980   COLONOSCOPY WITH PROPOFOL N/A 12/10/2016   Procedure: COLONOSCOPY WITH PROPOFOL;  Surgeon: [name], MD;  Location: [facility];  Service: Endoscopy;  Laterality: N/A;   HIP SURGERY Right    SHOULDER ARTHROSCOPY Right        Family History  Problem Relation Age of Onset   Emphysema Mother    Allergies Mother        "everyone in family"   Asthma Mother        "everyone in family"   Heart disease Mother    Clotting disorder Mother    Cancer Mother    Emphysema Father    Allergies Father    Allergies Son    Seizures Brother    Diabetes Brother     Social History  Tobacco Use   Smoking status: Some Days    Packs/day: 0.00    Years: 36.00    Pack years: 0.00    Types: Cigarettes   Smokeless tobacco: Never   Tobacco comments:    smokes 2 cigarettes per day 11/06/2020  Vaping Use   Vaping Use: Never used  Substance Use Topics   Alcohol use: Not Currently    Alcohol/week: 14.0 - 21.0 standard drinks    Types: 14 - 21 Standard drinks or equivalent per week    Comment: 2-3 beers/day, with occasional "binges".  No history of withdrawal   Drug use: Not Currently    Types: Marijuana, Cocaine    Comment: last cocaine use last month    Home Medications Prior to Admission medications   Medication Sig Start Date End Date Taking? Authorizing  Provider  albuterol (PROVENTIL) (2.5 MG/3ML) 0.083% nebulizer solution Take 3 mLs (2.5 mg total) by nebulization every 4 (four) hours as needed for wheezing or shortness of breath. 04/28/18  Yes [name], NP  albuterol (VENTOLIN HFA) 108 (90 Base) MCG/ACT inhaler INHALE TWO PUFFS BY MOUTH INTO THE LUNGS EVERY 4 HOURS AS NEEDED Patient taking differently: Inhale 1-2 puffs into the lungs every 4 (four) hours as needed for shortness of breath or wheezing. 08/13/21  Yes [name], MD  atorvastatin (LIPITOR) 40 MG tablet TAKE ONE TABLET BY MOUTH DAILY Patient taking differently: Take 40 mg by mouth daily. 07/06/20  Yes [name], MD  benztropine (COGENTIN) 0.5 MG tablet Take 0.5 mg by mouth at bedtime.    Yes [provider]  Budeson-Glycopyrrol-Formoterol (BREZTRI AEROSPHERE) 160-9-4.8 MCG/ACT AERO Inhale 2 puffs into the lungs in the morning and at bedtime. 07/25/21  Yes [name], NP  cetirizine (ZYRTEC) 10 MG tablet Take 10 mg by mouth daily.   Yes [provider]  diclofenac Sodium (VOLTAREN) 1 % GEL Apply 2 g topically 4 (four) times daily.   Yes [provider]  dorzolamide (TRUSOPT) 2 % ophthalmic solution Place 1 drop into the left eye 2 (two) times daily. 12/12/20  Yes [provider]  famotidine (PEPCID) 20 MG tablet TAKE 1 TABLET (20 MG TOTAL) BY MOUTH AT BEDTIME. 02/05/21  Yes [name], MD  fluticasone (FLONASE) 50 MCG/ACT nasal spray Place 2 sprays into both nostrils 3 (three) times daily as needed for allergies.    Yes [provider]  hydrochlorothiazide (HYDRODIURIL) 25 MG tablet TAKE ONE TABLET BY MOUTH DAILY 08/14/21  Yes Tolia, Sunit, DO  latanoprost (XALATAN) 0.005 % ophthalmic solution Place 1 drop into both eyes at bedtime. 05/24/19  Yes [provider]  losartan (COZAAR) 100 MG tablet Take 100 mg by mouth daily.   Yes [provider]  loxapine (LOXITANE) 10 MG capsule Take 10 mg by mouth at  bedtime.    Yes [provider]  megestrol (MEGACE) 40 MG tablet Take 40 mg by mouth daily.   Yes [provider]  mirtazapine (REMERON) 30 MG tablet Take 30 mg by mouth at bedtime.   Yes [provider]  Multiple Vitamin (MULTIVITAMIN) capsule Take 1 capsule by mouth daily.   Yes [provider]  OXYGEN Inhale 3-4 L into the lungs as directed. 3-4 liters with sleep and exertion as needed for low oxygen   Yes [provider]  pantoprazole (PROTONIX) 40 MG tablet Take 40 mg by mouth daily.   Yes [provider]  polyethylene glycol (MIRALAX / GLYCOLAX)  17 g packet TAKE 17 GRAMS BY MOUTH DAILY Patient taking differently: Take 17 g by mouth daily as needed for mild constipation. 03/09/20  Yes [name], MD  predniSONE (DELTASONE) 10 MG tablet TAKE ONE TO TWO TABLETS BY MOUTH DAILY AS DIRECTED Patient taking differently: Take 20 mg by mouth daily at 6 (six) AM. 07/18/21  Yes [name], MD  risperiDONE (RISPERDAL) 2 MG tablet Take 2 mg by mouth at bedtime.   Yes [provider]  Budeson-Glycopyrrol-Formoterol (BREZTRI AEROSPHERE) 160-9-4.8 MCG/ACT AERO Inhale 2 puffs into the lungs in the morning and at bedtime. Patient not taking: Reported on 09/07/2021 07/25/21   [name], NP  furosemide (LASIX) 20 MG tablet Take 1 tablet (20 mg total) by mouth daily. Patient not taking: No sig reported 05/01/21   [name], MD  LORazepam (ATIVAN) 1 MG tablet Take 1 tablet (1 mg total) by mouth 3 (three) times daily as needed for anxiety. Patient not taking: No sig reported 02/15/21   Daleen Bo, MD  mupirocin cream (BACTROBAN) 2 % Apply 1 application topically 2 (two) times daily. Patient not taking: No sig reported 05/01/21   [name], MD  potassium chloride SA (KLOR-CON) 20 MEQ tablet Take 1 tablet (20 mEq total) by mouth 2 (two) times daily. Patient not taking: No sig reported 02/15/21   Daleen Bo, MD    Allergies     Penicillins and Levocetirizine  Review of Systems   Review of Systems  Constitutional:  Negative for appetite change.  HENT:  Negative for congestion.   Respiratory:  Positive for cough and shortness of breath.   Cardiovascular:  Positive for chest pain. Negative for leg swelling.  Gastrointestinal:  Negative for abdominal pain.  Genitourinary:  Negative for flank pain.  Musculoskeletal:  Negative for back pain.  Skin:  Negative for rash.  Neurological:  Negative for weakness.  Psychiatric/Behavioral:  Negative for confusion.    Physical Exam Updated Vital Signs BP 110/83 (BP Location: Right Arm)   Pulse 80   Temp 98 F (36.7 C) (Oral)   Resp 20   Ht 5\' 11"  (1.803 m)   Wt 68 kg   SpO2 93%   BMI 20.92 kg/m   Physical Exam Vitals and nursing note reviewed.  HENT:     Head: Atraumatic.     Mouth/Throat:     Mouth: Mucous membranes are moist.  Cardiovascular:     Rate and Rhythm: Regular rhythm.  Pulmonary:     Comments: Mild harsh breath sounds.  No focal rales or rhonchi.  No tenderness to chest.  On nasal cannula oxygen. Abdominal:     Tenderness: There is no abdominal tenderness.  Musculoskeletal:        General: No tenderness.     Cervical back: Neck supple.  Skin:    General: Skin is warm.     Capillary Refill: Capillary refill takes less than 2 seconds.  Neurological:     Mental Status: He is alert and oriented to person, place, and time.    ED Results / Procedures / Treatments   Labs (all labs ordered are listed, but only abnormal results are displayed) Labs Reviewed - No data to display  EKG EKG Interpretation  Date/Time:  Friday September 07 2021 11:14:01 EST Ventricular Rate:  84 PR Interval:  162 QRS Duration: 104 QT Interval:  381 QTC Calculation: 451 R Axis:   -84 Text Interpretation: Sinus arrhythmia Consider left atrial enlargement Left anterior fascicular block  Low voltage, extremity and precordial leads Consider anterior infarct Baseline  wander in lead(s) V1 V3 V6 No significant change since last tracing Confirmed by Davonna Belling [phone]) on 09/07/2021 11:57:14 AM  Radiology DG Ribs Unilateral Right  Result Date: 09/07/2021 CLINICAL DATA:  Choking episode EXAM: RIGHT RIBS - 2 VIEW COMPARISON:  Same day chest radiograph FINDINGS: There is a subacute, partially callused fracture of the posterior right sixth rib corresponding to nodular opacity seen on comparison chest radiograph. No acute appearing fractures. Emphysema. IMPRESSION: There is a subacute, partially callused fracture of the posterior right sixth rib corresponding to nodular opacity seen on comparison chest radiograph. Electronically Signed   By: Delanna Ahmadi M.D.   On: 09/07/2021 11:46   DG Chest Portable 1 View  Result Date: 09/07/2021 CLINICAL DATA:  Shortness of breath EXAM: PORTABLE CHEST 1 VIEW COMPARISON:  06/03/2021 FINDINGS: Nodular density over the right mid lung measuring 2 cm. There is no edema, consolidation, effusion, or pneumothorax. Normal heart size and mediastinal contours. IMPRESSION: 1. Nodular density over the right chest favors subacute rib fracture and callus, but need confirmatory imaging with rib series or CT. 2. COPD. Electronically Signed   By: Jorje Guild M.D.   On: 09/07/2021 11:16    Procedures Procedures   Medications Ordered in ED Medications  albuterol (VENTOLIN HFA) 108 (90 Base) MCG/ACT inhaler 2 puff (has no administration in time range)    ED Course  I have reviewed the triage vital signs and the nursing notes.  Pertinent labs & imaging results that were available during my care of the patient were reviewed by me and considered in my medical decision making (see chart for details).    MDM Rules/Calculators/A&P                           Patient presents with shortness of breath.  Worsening after choking on a piece of sausage.  States he thinks he now has coughed up to sausage.  States he feels better.  Breathing is  at his baseline.  States he does have some chest pain has been on and off.  EKG done and reassuring.  X-ray initially showed possible rib fracture that was healing.  X-ray of the ribs done to clarify and does show subacute rib series.  Likely healing from fracture.  Patient has no known rib fracture.  Patient has run out of his rescue inhaler.  Will give 1 here.  Discharge home with outpatient follow-up as needed peer Final Clinical Impression(s) / ED Diagnoses Final diagnoses:  Choking, initial encounter    Rx / DC Orders ED Discharge Orders     None        Davonna Belling, MD 09/07/21 1228

## 2021-09-07 NOTE — ED Triage Notes (Signed)
Pt states this morning while eating sausage, the sausage "went down the wrong way" Pt states he felt short of breath, could not catch breath, Pt is on 3.5 L  at home continuous. Pt oxygen saturation at home is around 90%. Pt oxygen saturation on 3.5 L oxygen is currently 93%. Pt states he still feels something "tingling" in his throat. Pt coughing up mucus.

## 2021-09-12 ENCOUNTER — Other Ambulatory Visit: Payer: Self-pay | Admitting: Cardiology

## 2021-09-12 ENCOUNTER — Telehealth: Payer: Self-pay | Admitting: Cardiology

## 2021-09-12 ENCOUNTER — Other Ambulatory Visit: Payer: Self-pay

## 2021-09-12 MED ORDER — HYDROCHLOROTHIAZIDE 25 MG PO TABS: 25.0000 mg | ORAL_TABLET | Freq: Every day | ORAL | 1 refills | Status: DC

## 2021-09-12 NOTE — Telephone Encounter (Signed)
Prescription has been sent.

## 2021-09-12 NOTE — Telephone Encounter (Signed)
Patient requesting refill for hydrochlorothiazide 25 mg.

## 2021-09-19 ENCOUNTER — Telehealth: Payer: Self-pay | Admitting: Internal Medicine

## 2021-09-19 NOTE — Telephone Encounter (Signed)
I have called and LM on Vm for the pt to call back Friday morning.

## 2021-09-21 NOTE — Telephone Encounter (Signed)
Spoke with the pt  He states that [employer] told him that they are needing further info regarding o2  I have sent community msg to Glory Rosebush and Andee Poles to ask what they need Will update chart after I hear back from them

## 2021-09-21 NOTE — Telephone Encounter (Signed)
Received the following msg back from [facility]:  Item(s): [id number]  Sales Order(s): [id number]  [facility] Team: [place] team  What is needed to resolve: CN, [place] MCD CMN  Contact Numbers Attempted:  Number of Attempts Made:  Additional Information: Josem Kaufmann has been denied - Unable to obtain documentation after numerous attempts to MD/Patient   For where to direct your questions, please review the sales order Stop Reason. If the Stop Reason is "ARP-Branch Review", then contact the [facility] Team listed above. If the Stop Reason is for "[employer] 1-ASSET RECOVERY", then contact the Asset Recovery Team at [email or web address]. If still unsure where to direct questions, reach out to your leadership and/or local RCM contacts.    Last comment: Cory Munch 09/18/2021 6:51:37 PM    Called [phone] spoke to PT. Educated that we have not obtained any documents from his Dr. Durward Fortes to contact his DR. Ack.   Called and spoke with [facility] and let her know that pt seen and qualified for o2 07/25/21. They will pull the info from Epic. Nothing further needed.

## 2021-09-24 ENCOUNTER — Emergency Department (HOSPITAL_COMMUNITY)
Admission: EM | Admit: 2021-09-24 | Discharge: 2021-09-24 | Disposition: A | Discharge status: Home / Self Care | Payer: Medicaid Other | Attending: Emergency Medicine | Admitting: Emergency Medicine

## 2021-09-24 ENCOUNTER — Emergency Department (HOSPITAL_COMMUNITY): Payer: Medicaid Other

## 2021-09-24 ENCOUNTER — Encounter (HOSPITAL_COMMUNITY): Payer: Self-pay | Admitting: Emergency Medicine

## 2021-09-24 DIAGNOSIS — J449 Chronic obstructive pulmonary disease, unspecified: Secondary | ICD-10-CM

## 2021-09-24 DIAGNOSIS — Z79899 Other long term (current) drug therapy: Secondary | ICD-10-CM | POA: Diagnosis not present

## 2021-09-24 DIAGNOSIS — J441 Chronic obstructive pulmonary disease with (acute) exacerbation: Secondary | ICD-10-CM | POA: Insufficient documentation

## 2021-09-24 DIAGNOSIS — R0602 Shortness of breath: Secondary | ICD-10-CM | POA: Diagnosis present

## 2021-09-24 DIAGNOSIS — Z85038 Personal history of other malignant neoplasm of large intestine: Secondary | ICD-10-CM | POA: Diagnosis not present

## 2021-09-24 DIAGNOSIS — Z20822 Contact with and (suspected) exposure to covid-19: Secondary | ICD-10-CM | POA: Diagnosis not present

## 2021-09-24 DIAGNOSIS — I10 Essential (primary) hypertension: Secondary | ICD-10-CM | POA: Diagnosis not present

## 2021-09-24 DIAGNOSIS — Z7951 Long term (current) use of inhaled steroids: Secondary | ICD-10-CM | POA: Insufficient documentation

## 2021-09-24 DIAGNOSIS — F17210 Nicotine dependence, cigarettes, uncomplicated: Secondary | ICD-10-CM | POA: Insufficient documentation

## 2021-09-24 LAB — BASIC METABOLIC PANEL
Anion gap: 8 (ref 5–15)
BUN: 16 mg/dL (ref 6–20)
CO2: 29 mmol/L (ref 22–32)
Calcium: 9.2 mg/dL (ref 8.9–10.3)
Chloride: 102 mmol/L (ref 98–111)
Creatinine, Ser: 0.95 mg/dL (ref 0.61–1.24)
GFR, Estimated: >60 mL/min (ref >60)
Glucose, Bld: 107 mg/dL — ABNORMAL HIGH (ref 70–99)
Potassium: 3.3 mmol/L — ABNORMAL LOW (ref 3.5–5.1)
Sodium: 139 mmol/L (ref 135–145)

## 2021-09-24 LAB — CBC
HCT: 46.4 % (ref 39.0–52.0)
Hemoglobin: 15.1 g/dL (ref 13.0–17.0)
MCH: 32.5 pg (ref 26.0–34.0)
MCHC: 32.5 g/dL (ref 30.0–36.0)
MCV: 99.8 fL (ref 80.0–100.0)
Platelets: 274 10*3/uL (ref 150–400)
RBC: 4.65 MIL/uL (ref 4.22–5.81)
RDW: 15 % (ref 11.5–15.5)
WBC: 14.7 10*3/uL — ABNORMAL HIGH (ref 4.0–10.5)
nRBC: 0 % (ref 0.0–0.2)

## 2021-09-24 LAB — RESP PANEL BY RT-PCR (FLU A&B, COVID) ARPGX2
Influenza A by PCR: NEGATIVE
Influenza B by PCR: NEGATIVE
SARS Coronavirus 2 by RT PCR: NEGATIVE

## 2021-09-24 LAB — TROPONIN I (HIGH SENSITIVITY)
Troponin I (High Sensitivity): 9 ng/L (ref <18)
Troponin I (High Sensitivity): 9 ng/L (ref <18)

## 2021-09-24 IMAGING — DX DG CHEST 2V
3 series · 4 of 4 positions shown · non-contrast
Comparison: [DATE]

CLINICAL DATA: Chest pain

EXAM:
CHEST - 2 VIEW

[chest lat (1 of 2)]
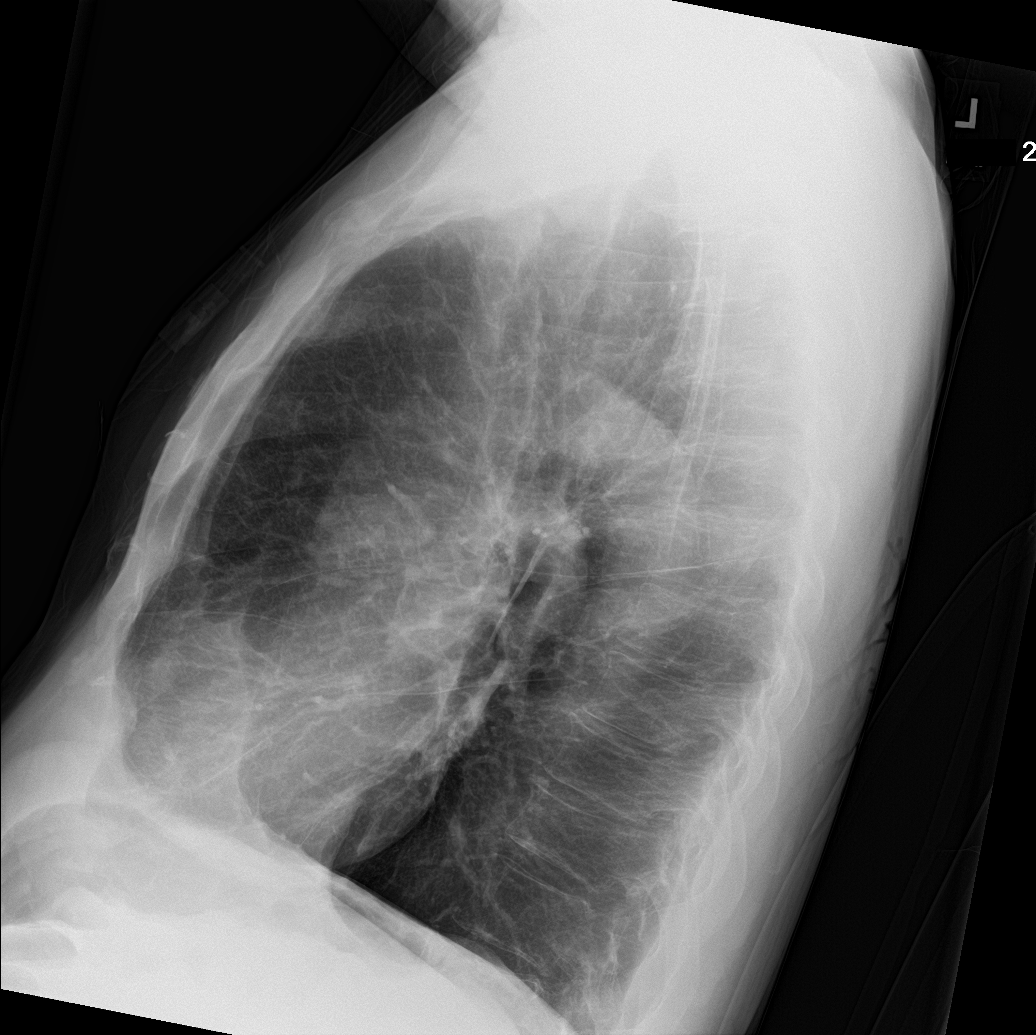

[Series 3: chest ap · 0.14mm/px · 2 of 2 slices shown]
[im 1/2]
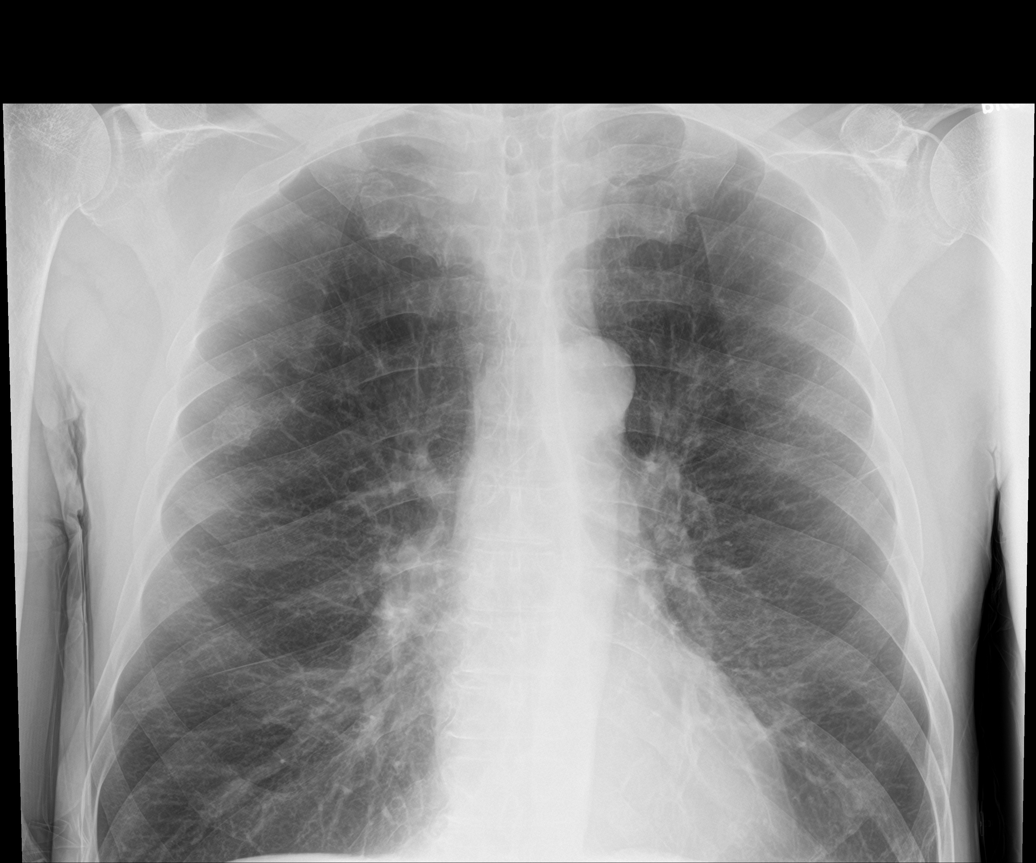
[im 2/2]
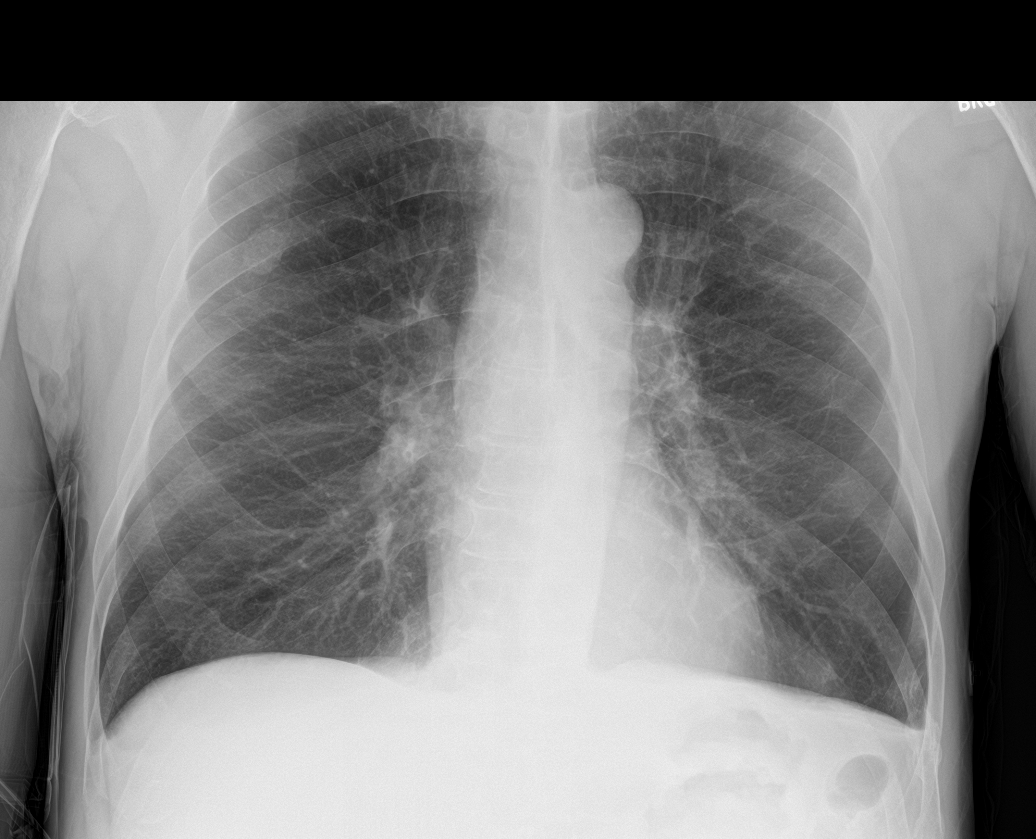

[chest lat (2 of 2)]
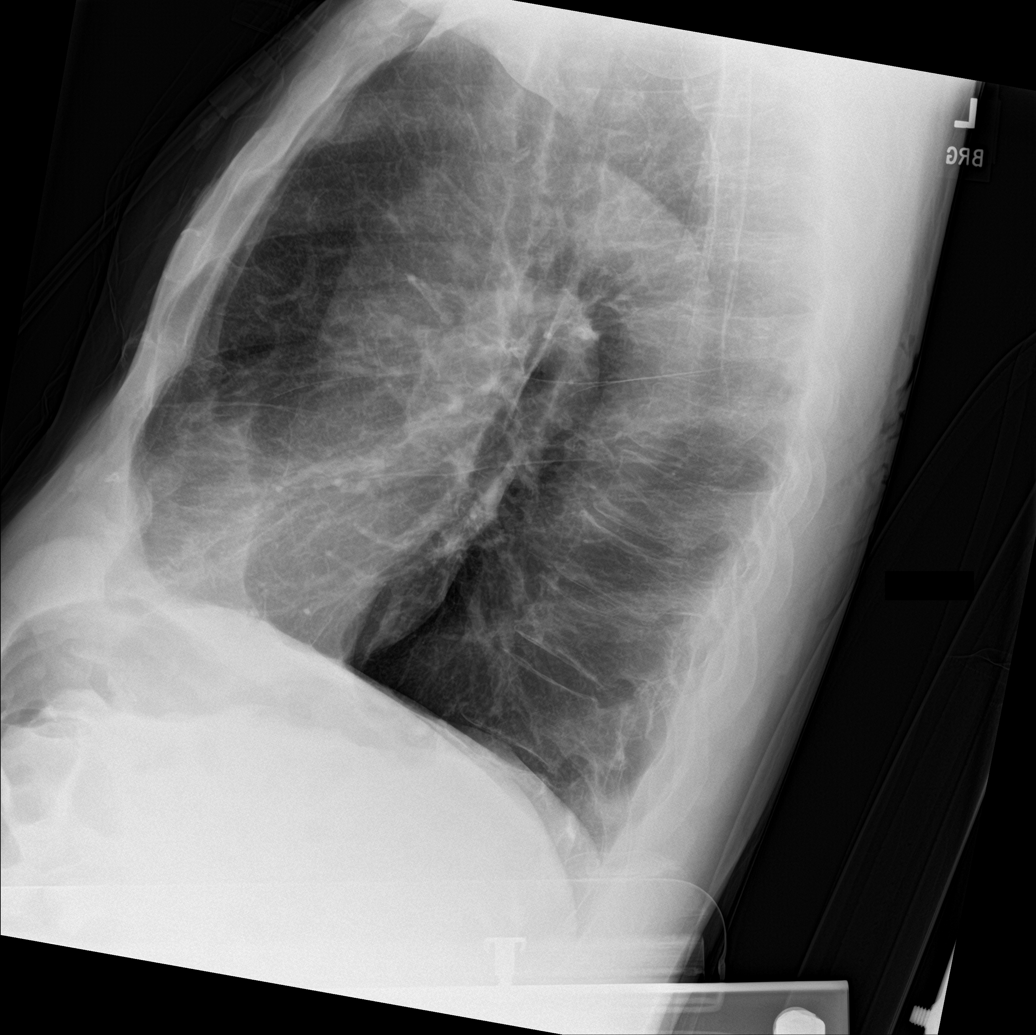

[4 of 4 positions shown; findings below may reference images not displayed]

FINDINGS: Background changes of emphysema with hyperinflation. No new
consolidation or edema. Cardiomediastinal contours are stable. No
acute osseous abnormality.
IMPRESSION: No acute process in the chest

## 2021-09-24 MED ORDER — AZITHROMYCIN 250 MG PO TABS
500.0000 mg | ORAL_TABLET | Freq: Once | ORAL | Status: AC
  Administered 2021-09-24: 17:00:00 500 mg via ORAL
  Filled 2021-09-24: qty 2

## 2021-09-24 MED ORDER — AZITHROMYCIN 250 MG PO TABS: 250.0000 mg | ORAL_TABLET | Freq: Every day | ORAL | 0 refills | Status: DC

## 2021-09-24 MED ORDER — ALBUTEROL SULFATE (2.5 MG/3ML) 0.083% IN NEBU: 2.5000 mg | INHALATION_SOLUTION | RESPIRATORY_TRACT | 2 refills | Status: DC | PRN

## 2021-09-24 MED ORDER — ALBUTEROL SULFATE HFA 108 (90 BASE) MCG/ACT IN AERS
2.0000 | INHALATION_SPRAY | Freq: Once | RESPIRATORY_TRACT | Status: AC
  Administered 2021-09-24: 17:00:00 2 via RESPIRATORY_TRACT
  Filled 2021-09-24: qty 6.7

## 2021-09-24 MED ORDER — METHYLPREDNISOLONE SODIUM SUCC 125 MG IJ SOLR
125.0000 mg | Freq: Once | INTRAMUSCULAR | Status: AC
  Administered 2021-09-24: 17:00:00 125 mg via INTRAMUSCULAR
  Filled 2021-09-24: qty 2

## 2021-09-24 MED ORDER — ALBUTEROL SULFATE HFA 108 (90 BASE) MCG/ACT IN AERS: 2.0000 | INHALATION_SPRAY | Freq: Four times a day (QID) | RESPIRATORY_TRACT | 2 refills | Status: DC | PRN

## 2021-09-24 NOTE — ED Notes (Signed)
Pt in bed and complaint with requests

## 2021-09-24 NOTE — ED Provider Notes (Signed)
[facility] EMERGENCY DEPARTMENT Provider Note   CSN: [id number] Arrival date & time: 09/24/21  1119     History No chief complaint on file.   Tony Sullivan is a 60 y.o. male.  Pt reports he has COPD.  Pt reports he is on 4 liters of oxygen.  Pt reports he is using more often.  Pt reports he ran out of albuterol inhaler.    The history is provided by the patient. No language interpreter was used.  Shortness of Breath Severity:  Moderate Onset quality:  Gradual Duration:  3 days Timing:  Constant Progression:  Worsening Chronicity:  New Context: not activity, not smoke exposure and not URI   Relieved by:  Nothing Worsened by:  Nothing Ineffective treatments:  None tried Associated symptoms: no sputum production   Risk factors: no hx of PE/DVT       Past Medical History:  Diagnosis Date   Anxiety    Arthritis    COPD (chronic obstructive pulmonary disease) ([place]) 2004   diagnosed in 2004, no PFT's to date.  Started on home O2 12/2013, after found to be desatting at PCP's office, and referred to pulmonology.   Depression    GERD (gastroesophageal reflux disease)    High blood pressure    Prediabetes    Seasonal allergies    Tobacco dependence     Patient Active Problem List   Diagnosis Date Noted   Physical deconditioning 07/26/2021   Numbness and tingling in left arm 05/06/2021   Leukocytosis 05/05/2021   Severe headache 05/05/2021   Hyperlipidemia 05/24/2020   Hypokalemia 05/24/2020   Chest pain 05/23/2020   Bipolar disorder ([place]) 05/23/2020   HNP (herniated nucleus pulposus), cervical 09/06/2019   Abdominal pain    Poor dentition 11/04/2017   Acute on chronic respiratory failure with hypoxia ([place]) 08/29/2017   ETOH abuse 03/30/2017   Cigarette smoker 03/30/2017   Acute respiratory failure with hypoxia and hypercapnia ([facility]) 03/29/2017   Special screening for malignant neoplasms, colon    Benign neoplasm of ascending colon    Benign neoplasm of  descending colon    Hoarseness 08/22/2016   GERD (gastroesophageal reflux disease) 08/22/2016   Atypical chest pain 12/05/2015   Essential hypertension 06/15/2014   Pectoralis muscle strain 01/11/2014   COPD with acute exacerbation ([place]) 01/10/2014   COPD  GOLD III 01/08/2014   Smoker 01/08/2014   Cough 01/08/2014   Chronic respiratory failure with hypoxia ([place]) 01/08/2014    Past Surgical History:  Procedure Laterality Date   ANTERIOR CERVICAL DECOMP/DISCECTOMY FUSION N/A 09/06/2019   Procedure: Anterior Cervical Discecectomy Fusion - Cerivcal Five-Cervical Six;  Surgeon: Kary Kos, MD;  Location: [facility];  Service: Neurosurgery;  Laterality: N/A;  Anterior Cervical Discecectomy Fusion - Cerivcal Five-Cervical Six   ANTERIOR CERVICAL DISCECTOMY  09/06/2019   APPENDECTOMY  1980   COLONOSCOPY WITH PROPOFOL N/A 12/10/2016   Procedure: COLONOSCOPY WITH PROPOFOL;  Surgeon: [name], MD;  Location: [facility];  Service: Endoscopy;  Laterality: N/A;   HIP SURGERY Right    SHOULDER ARTHROSCOPY Right        Family History  Problem Relation Age of Onset   Emphysema Mother    Allergies Mother        "everyone in family"   Asthma Mother        "everyone in family"   Heart disease Mother    Clotting disorder Mother    Cancer Mother    Emphysema Father  Allergies Father    Allergies Son    Seizures Brother    Diabetes Brother     Social History   Tobacco Use   Smoking status: Some Days    Packs/day: 0.00    Years: 36.00    Pack years: 0.00    Types: Cigarettes   Smokeless tobacco: Never   Tobacco comments:    smokes 2 cigarettes per day 11/06/2020  Vaping Use   Vaping Use: Never used  Substance Use Topics   Alcohol use: Not Currently    Alcohol/week: 14.0 - 21.0 standard drinks    Types: 14 - 21 Standard drinks or equivalent per week    Comment: 2-3 beers/day, with occasional "binges".  No history of withdrawal   Drug use: Not Currently    Types: Marijuana,  Cocaine    Comment: last cocaine use last month    Home Medications Prior to Admission medications   Medication Sig Start Date End Date Taking? Authorizing Provider  albuterol (PROVENTIL) (2.5 MG/3ML) 0.083% nebulizer solution Take 3 mLs (2.5 mg total) by nebulization every 4 (four) hours as needed for wheezing or shortness of breath. 04/28/18   [name], NP  albuterol (VENTOLIN HFA) 108 (90 Base) MCG/ACT inhaler INHALE TWO PUFFS BY MOUTH INTO THE LUNGS EVERY 4 HOURS AS NEEDED Patient taking differently: Inhale 1-2 puffs into the lungs every 4 (four) hours as needed for shortness of breath or wheezing. 08/13/21   [name], MD  atorvastatin (LIPITOR) 40 MG tablet TAKE ONE TABLET BY MOUTH DAILY Patient taking differently: Take 40 mg by mouth daily. 07/06/20   [name], MD  benztropine (COGENTIN) 0.5 MG tablet Take 0.5 mg by mouth at bedtime.     [provider]  Budeson-Glycopyrrol-Formoterol (BREZTRI AEROSPHERE) 160-9-4.8 MCG/ACT AERO Inhale 2 puffs into the lungs in the morning and at bedtime. 07/25/21   [name], NP  Budeson-Glycopyrrol-Formoterol (BREZTRI AEROSPHERE) 160-9-4.8 MCG/ACT AERO Inhale 2 puffs into the lungs in the morning and at bedtime. Patient not taking: Reported on 09/07/2021 07/25/21   [name], NP  cetirizine (ZYRTEC) 10 MG tablet Take 10 mg by mouth daily.    [provider]  diclofenac Sodium (VOLTAREN) 1 % GEL Apply 2 g topically 4 (four) times daily.    [provider]  dorzolamide (TRUSOPT) 2 % ophthalmic solution Place 1 drop into the left eye 2 (two) times daily. 12/12/20   [provider]  famotidine (PEPCID) 20 MG tablet TAKE 1 TABLET (20 MG TOTAL) BY MOUTH AT BEDTIME. 02/05/21   [name], MD  fluticasone (FLONASE) 50 MCG/ACT nasal spray Place 2 sprays into both nostrils 3 (three) times daily as needed for allergies.     [provider]  furosemide (LASIX) 20 MG tablet Take 1  tablet (20 mg total) by mouth daily. Patient not taking: No sig reported 05/01/21   [name], MD  hydrochlorothiazide (HYDRODIURIL) 25 MG tablet Take 1 tablet (25 mg total) by mouth daily. 09/12/21   Tolia, Sunit, DO  latanoprost (XALATAN) 0.005 % ophthalmic solution Place 1 drop into both eyes at bedtime. 05/24/19   [provider]  LORazepam (ATIVAN) 1 MG tablet Take 1 tablet (1 mg total) by mouth 3 (three) times daily as needed for anxiety. Patient not taking: No sig reported 02/15/21   Daleen Bo, MD  losartan (COZAAR) 100 MG tablet Take 100 mg by mouth daily.    [provider]  loxapine Gust Rung) 10  MG capsule Take 10 mg by mouth at bedtime.     [provider]  megestrol (MEGACE) 40 MG tablet Take 40 mg by mouth daily.    [provider]  mirtazapine (REMERON) 30 MG tablet Take 30 mg by mouth at bedtime.    [provider]  Multiple Vitamin (MULTIVITAMIN) capsule Take 1 capsule by mouth daily.    [provider]  mupirocin cream (BACTROBAN) 2 % Apply 1 application topically 2 (two) times daily. Patient not taking: No sig reported 05/01/21   [name], MD  OXYGEN Inhale 3-4 L into the lungs as directed. 3-4 liters with sleep and exertion as needed for low oxygen    [provider]  pantoprazole (PROTONIX) 40 MG tablet Take 40 mg by mouth daily.    [provider]  polyethylene glycol (MIRALAX / GLYCOLAX) 17 g packet TAKE 17 GRAMS BY MOUTH DAILY Patient taking differently: Take 17 g by mouth daily as needed for mild constipation. 03/09/20   [name], MD  potassium chloride SA (KLOR-CON) 20 MEQ tablet Take 1 tablet (20 mEq total) by mouth 2 (two) times daily. Patient not taking: No sig reported 02/15/21   Daleen Bo, MD  predniSONE (DELTASONE) 10 MG tablet TAKE ONE TO TWO TABLETS BY MOUTH DAILY AS DIRECTED Patient taking differently: Take 20 mg by mouth daily at 6 (six) AM. 07/18/21   [name], MD   risperiDONE (RISPERDAL) 2 MG tablet Take 2 mg by mouth at bedtime.    [provider]    Allergies    Penicillins and Levocetirizine  Review of Systems   Review of Systems  Respiratory:  Positive for shortness of breath. Negative for sputum production.   All other systems reviewed and are negative.  Physical Exam Updated Vital Signs BP 129/90 (BP Location: Right Arm)   Pulse 81   Temp 98 F (36.7 C) (Oral)   Resp 18   SpO2 100%   Physical Exam Vitals and nursing note reviewed.  Constitutional:      General: He is not in acute distress.    Appearance: He is well-developed.  HENT:     Head: Normocephalic and atraumatic.     Mouth/Throat:     Mouth: Mucous membranes are moist.  Eyes:     Conjunctiva/sclera: Conjunctivae normal.  Cardiovascular:     Rate and Rhythm: Normal rate and regular rhythm.     Heart sounds: No murmur heard. Pulmonary:     Effort: Pulmonary effort is normal. No respiratory distress.     Breath sounds: Normal breath sounds.  Abdominal:     Palpations: Abdomen is soft.     Tenderness: There is no abdominal tenderness.  Musculoskeletal:        General: No swelling.     Cervical back: Neck supple.  Skin:    General: Skin is warm and dry.     Capillary Refill: Capillary refill takes less than 2 seconds.  Neurological:     Mental Status: He is alert.  Psychiatric:        Mood and Affect: Mood normal.    ED Results / Procedures / Treatments   Labs (all labs ordered are listed, but only abnormal results are displayed) Labs Reviewed  BASIC METABOLIC PANEL - Abnormal; Notable for the following components:      Result Value   Potassium 3.3 (*)    Glucose, Bld 107 (*)    All other components within normal limits  CBC -  Abnormal; Notable for the following components:   WBC 14.7 (*)    All other components within normal limits  RESP PANEL BY RT-PCR (FLU A&B, COVID) ARPGX2  TROPONIN I (HIGH SENSITIVITY)  TROPONIN I (HIGH SENSITIVITY)     EKG EKG Interpretation  Date/Time:  Monday September 24 2021 11:34:36 EST Ventricular Rate:  75 PR Interval:  156 QRS Duration: 74 QT Interval:  396 QTC Calculation: 442 R Axis:   -88 Text Interpretation: Normal sinus rhythm Right atrial enlargement Left axis deviation Pulmonary disease pattern Abnormal ECG Confirmed by Sherwood Gambler [phone]) on 09/24/2021 3:38:37 PM  Radiology DG Chest 2 View  Result Date: 09/24/2021 CLINICAL DATA:  Chest pain EXAM: CHEST - 2 VIEW COMPARISON:  09/07/2021 FINDINGS: Background changes of emphysema with hyperinflation. No new consolidation or edema. Cardiomediastinal contours are stable. No acute osseous abnormality. IMPRESSION: No acute process in the chest Electronically Signed   By: Macy Mis M.D.   On: 09/24/2021 12:22    Procedures Procedures   Medications Ordered in ED Medications  methylPREDNISolone sodium succinate (SOLU-MEDROL) 125 mg/2 mL injection 125 mg (has no administration in time range)  albuterol (VENTOLIN HFA) 108 (90 Base) MCG/ACT inhaler 2 puff (has no administration in time range)    ED Course  I have reviewed the triage vital signs and the nursing notes.  Pertinent labs & imaging results that were available during my care of the patient were reviewed by me and considered in my medical decision making (see chart for details).  Clinical Course as of 09/24/21 1622  Mon Sep 24, 2021  1609 Troponin I (High Sensitivity) [LS]    Clinical Course User Index [LS] Sidney Ace   MDM Rules/Calculators/A&P                           MDM;  xray and labs ordered, reviewed and interpreted.  Covid and influenza are negative,  chest xray no pneumonia.  Troponin is negative x2.  Pt given albuterol inhaler, solumedrol and zithromax.   Final Clinical Impression(s) / ED Diagnoses Final diagnoses:  Chronic obstructive pulmonary disease, unspecified COPD type ([place])    Rx / DC Orders ED Discharge Orders           Ordered    albuterol (VENTOLIN HFA) 108 (90 Base) MCG/ACT inhaler  Every 6 hours PRN        09/24/21 1632    albuterol (PROVENTIL) (2.5 MG/3ML) 0.083% nebulizer solution  Every 4 hours PRN        09/24/21 1632    azithromycin (ZITHROMAX Z-PAK) 250 MG tablet  Daily        09/24/21 1632          An After Visit Summary was printed and given to the patient.    Fransico Meadow, [place] 09/24/21 [id number]    Sherwood Gambler, MD 09/27/21 [phone]

## 2021-09-24 NOTE — ED Triage Notes (Signed)
Pt here from home with c/oi chest pain that started 2-3 days along with some sob and some n/v

## 2021-09-24 NOTE — ED Provider Notes (Signed)
Emergency Medicine Provider Triage Evaluation Note  Tony Sullivan , a 60 y.o. male  was evaluated in triage.  Pt complains of chest pain and shortness of breath  Review of Systems  Positive: cough Negative: fever  Physical Exam  BP 129/90 (BP Location: Right Arm)   Pulse 81   Temp 98 F (36.7 C) (Oral)   Resp 18   SpO2 100%  Gen:   Awake, no distress   Resp:  Normal effort  MSK:    Other:    Medical Decision Making  Medically screening exam initiated at 12:48 PM.  Appropriate orders placed.  Tony Sullivan was informed that the remainder of the evaluation will be completed by another provider, this initial triage assessment does not replace that evaluation, and the importance of remaining in the ED until their evaluation is complete.     Sidney Ace 09/24/21 1248    Davonna Belling, MD 09/25/21 [phone]

## 2021-09-24 NOTE — ED Notes (Signed)
Pt wheeled himself to room 6 via electronic wheelchair. He did not want to get onto the bed or be connected to cardiac monitor. He kept stating he only wants to see a doctor.

## 2021-10-01 ENCOUNTER — Encounter (HOSPITAL_COMMUNITY): Payer: Self-pay | Admitting: *Deleted

## 2021-10-01 ENCOUNTER — Other Ambulatory Visit: Payer: Self-pay | Admitting: Internal Medicine

## 2021-10-01 ENCOUNTER — Observation Stay (HOSPITAL_COMMUNITY)
Admission: EM | Admit: 2021-10-01 | Discharge: 2021-10-02 | Disposition: A | Discharge status: Home / Self Care | Payer: Medicaid Other | Attending: Emergency Medicine | Admitting: Emergency Medicine

## 2021-10-01 ENCOUNTER — Emergency Department (HOSPITAL_COMMUNITY): Payer: Medicaid Other

## 2021-10-01 DIAGNOSIS — F17210 Nicotine dependence, cigarettes, uncomplicated: Secondary | ICD-10-CM | POA: Diagnosis not present

## 2021-10-01 DIAGNOSIS — Z79899 Other long term (current) drug therapy: Secondary | ICD-10-CM | POA: Diagnosis not present

## 2021-10-01 DIAGNOSIS — J441 Chronic obstructive pulmonary disease with (acute) exacerbation: Principal | ICD-10-CM | POA: Diagnosis present

## 2021-10-01 DIAGNOSIS — E876 Hypokalemia: Secondary | ICD-10-CM | POA: Diagnosis not present

## 2021-10-01 DIAGNOSIS — R051 Acute cough: Secondary | ICD-10-CM

## 2021-10-01 DIAGNOSIS — J9611 Chronic respiratory failure with hypoxia: Secondary | ICD-10-CM | POA: Diagnosis present

## 2021-10-01 DIAGNOSIS — I10 Essential (primary) hypertension: Secondary | ICD-10-CM | POA: Diagnosis present

## 2021-10-01 DIAGNOSIS — D72829 Elevated white blood cell count, unspecified: Secondary | ICD-10-CM | POA: Diagnosis not present

## 2021-10-01 DIAGNOSIS — Z20822 Contact with and (suspected) exposure to covid-19: Secondary | ICD-10-CM | POA: Diagnosis not present

## 2021-10-01 DIAGNOSIS — E46 Unspecified protein-calorie malnutrition: Secondary | ICD-10-CM

## 2021-10-01 DIAGNOSIS — E782 Mixed hyperlipidemia: Secondary | ICD-10-CM

## 2021-10-01 DIAGNOSIS — R7303 Prediabetes: Secondary | ICD-10-CM | POA: Diagnosis not present

## 2021-10-01 DIAGNOSIS — K219 Gastro-esophageal reflux disease without esophagitis: Secondary | ICD-10-CM | POA: Diagnosis present

## 2021-10-01 DIAGNOSIS — R059 Cough, unspecified: Secondary | ICD-10-CM | POA: Diagnosis present

## 2021-10-01 LAB — BASIC METABOLIC PANEL
Anion gap: 6 (ref 5–15)
BUN: 18 mg/dL (ref 6–20)
CO2: 31 mmol/L (ref 22–32)
Calcium: 9 mg/dL (ref 8.9–10.3)
Chloride: 100 mmol/L (ref 98–111)
Creatinine, Ser: 1 mg/dL (ref 0.61–1.24)
GFR, Estimated: >60 mL/min (ref >60)
Glucose, Bld: 96 mg/dL (ref 70–99)
Potassium: 3.7 mmol/L (ref 3.5–5.1)
Sodium: 137 mmol/L (ref 135–145)

## 2021-10-01 LAB — CBC
HCT: 49.1 % (ref 39.0–52.0)
Hemoglobin: 15.4 g/dL (ref 13.0–17.0)
MCH: 31.5 pg (ref 26.0–34.0)
MCHC: 31.4 g/dL (ref 30.0–36.0)
MCV: 100.4 fL — ABNORMAL HIGH (ref 80.0–100.0)
Platelets: 249 10*3/uL (ref 150–400)
RBC: 4.89 MIL/uL (ref 4.22–5.81)
RDW: 14.6 % (ref 11.5–15.5)
WBC: 12.2 10*3/uL — ABNORMAL HIGH (ref 4.0–10.5)
nRBC: 0 % (ref 0.0–0.2)

## 2021-10-01 LAB — RESP PANEL BY RT-PCR (FLU A&B, COVID) ARPGX2
Influenza A by PCR: NEGATIVE
Influenza B by PCR: NEGATIVE
SARS Coronavirus 2 by RT PCR: NEGATIVE

## 2021-10-01 LAB — TROPONIN I (HIGH SENSITIVITY)
Troponin I (High Sensitivity): 11 ng/L (ref <18)
Troponin I (High Sensitivity): 10 ng/L (ref <18)

## 2021-10-01 IMAGING — DX DG CHEST 2V
3 series · 3 of 3 positions shown · non-contrast
Comparison: Radiographs [DATE].  CT [DATE].

CLINICAL DATA: Chest pain with cough for more than 1 week. History
of COPD.

EXAM:
CHEST - 2 VIEW

[chest lat]
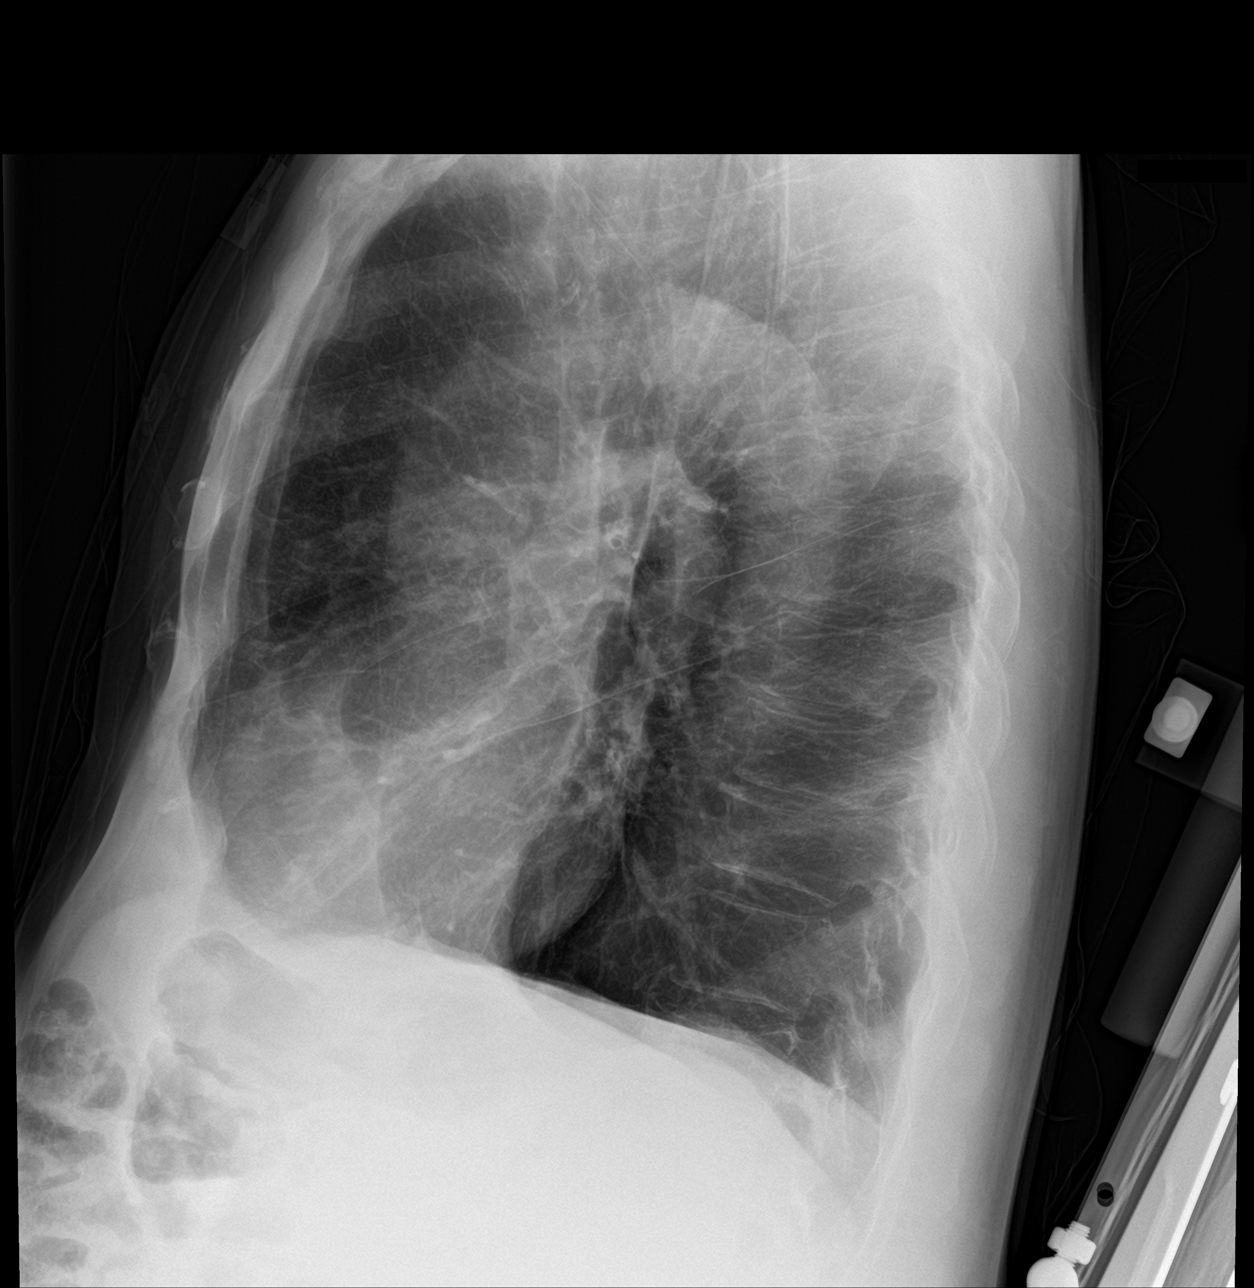

[chest ap (1 of 2)]
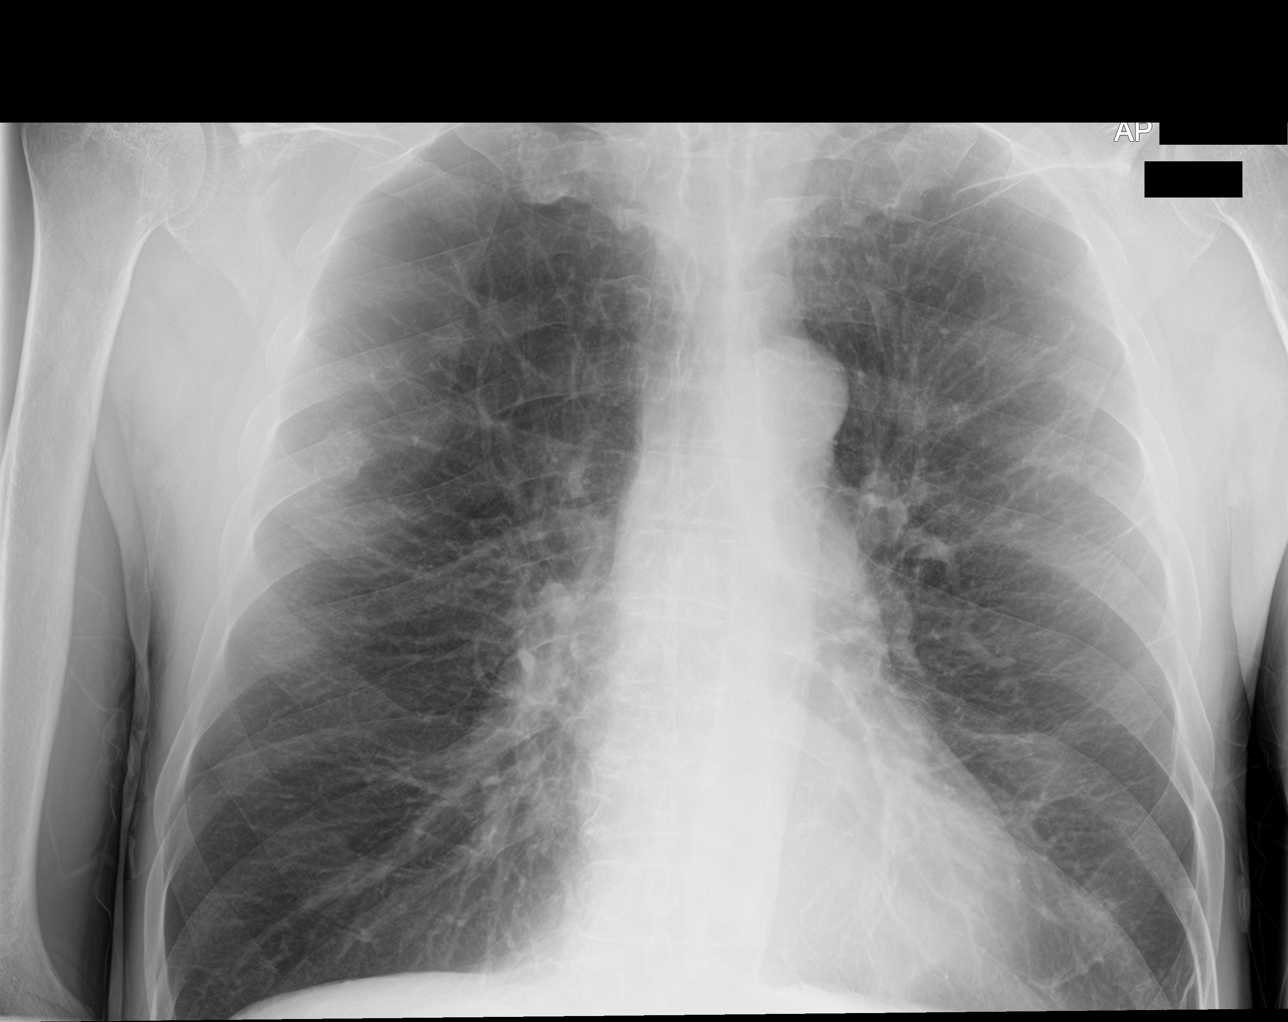

[chest ap (2 of 2)]
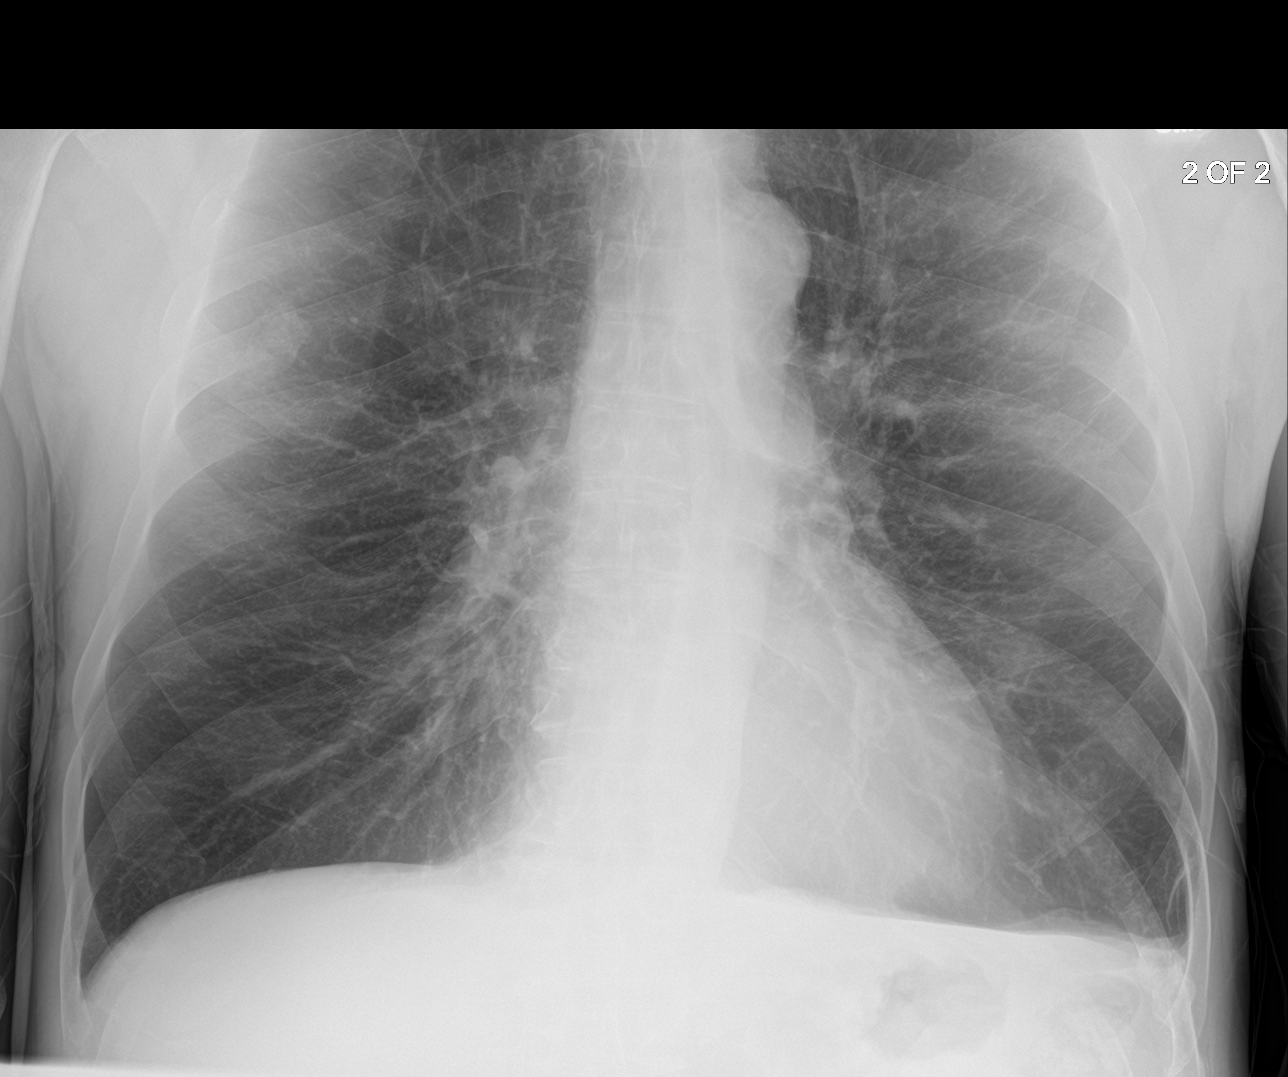

[3 of 3 positions shown; findings below may reference images not displayed]

FINDINGS: The PA view was repeated. The heart size and mediastinal contours
are stable. The lungs are hyperinflated but clear. No evidence of
pleural effusion or pneumothorax. Old right-sided rib fracture
noted. No acute osseous findings.
IMPRESSION: Stable changes of chronic obstructive pulmonary disease. No acute
cardiopulmonary process identified.

## 2021-10-01 MED ORDER — ALBUTEROL SULFATE HFA 108 (90 BASE) MCG/ACT IN AERS
3.0000 | INHALATION_SPRAY | Freq: Once | RESPIRATORY_TRACT | Status: AC
  Administered 2021-10-01: 3 via RESPIRATORY_TRACT
  Filled 2021-10-01: qty 6.7

## 2021-10-01 MED ORDER — IPRATROPIUM-ALBUTEROL 0.5-2.5 (3) MG/3ML IN SOLN
3.0000 mL | Freq: Once | RESPIRATORY_TRACT | Status: AC
  Administered 2021-10-01: 3 mL via RESPIRATORY_TRACT
  Filled 2021-10-01: qty 3

## 2021-10-01 MED ORDER — PREDNISONE 50 MG PO TABS
60.0000 mg | ORAL_TABLET | Freq: Once | ORAL | Status: AC
  Administered 2021-10-01: 60 mg via ORAL
  Filled 2021-10-01: qty 1

## 2021-10-01 NOTE — ED Triage Notes (Signed)
Cough for over a week

## 2021-10-01 NOTE — ED Provider Notes (Signed)
[facility] EMERGENCY DEPARTMENT Provider Note   CSN: [id number] Arrival date & time: 10/01/21  1207     History Chief Complaint  Patient presents with   Cough    Tony Sullivan is a 60 y.o. male with a history significant for COPD, on home oxygen 3 L as needed, GERD, hypertension, presenting for evaluation of a 1 week history of nonproductive cough with increasing shortness of breath.  He also endorses wheezing, denies fevers, chills, cough has been nonproductive.  He has been using his albuterol nebulizer treatments, has run out of his albuterol MDI.  He has also started new inhaler Breztri 2 months ago.  He denies chest pain, abdominal pain, nausea or vomiting.  He has had some nasal congestion making it more difficult for him to use his home oxygen.  States he kept waking last night panicking over not getting enough air.  He has found no alleviators for this symptom.  He states that he had a really rough night last night trying to sleep, continued waking up with panic and shortness of breath.  The history is provided by the patient.      Past Medical History:  Diagnosis Date   Anxiety    Arthritis    COPD (chronic obstructive pulmonary disease) ([place]) 2004   diagnosed in 2004, no PFT's to date.  Started on home O2 12/2013, after found to be desatting at PCP's office, and referred to pulmonology.   Depression    GERD (gastroesophageal reflux disease)    High blood pressure    Prediabetes    Seasonal allergies    Tobacco dependence     Patient Active Problem List   Diagnosis Date Noted   Physical deconditioning 07/26/2021   Numbness and tingling in left arm 05/06/2021   Leukocytosis 05/05/2021   Severe headache 05/05/2021   Hyperlipidemia 05/24/2020   Hypokalemia 05/24/2020   Chest pain 05/23/2020   Bipolar disorder ([place]) 05/23/2020   HNP (herniated nucleus pulposus), cervical 09/06/2019   Abdominal pain    Poor dentition 11/04/2017   Acute on chronic respiratory  failure with hypoxia ([place]) 08/29/2017   ETOH abuse 03/30/2017   Cigarette smoker 03/30/2017   Acute respiratory failure with hypoxia and hypercapnia (HCC) 03/29/2017   Special screening for malignant neoplasms, colon    Benign neoplasm of ascending colon    Benign neoplasm of descending colon    Hoarseness 08/22/2016   GERD (gastroesophageal reflux disease) 08/22/2016   Atypical chest pain 12/05/2015   Essential hypertension 06/15/2014   Pectoralis muscle strain 01/11/2014   COPD with acute exacerbation ([place]) 01/10/2014   COPD  GOLD III 01/08/2014   Smoker 01/08/2014   Cough 01/08/2014   Chronic respiratory failure with hypoxia ([place]) 01/08/2014    Past Surgical History:  Procedure Laterality Date   ANTERIOR CERVICAL DECOMP/DISCECTOMY FUSION N/A 09/06/2019   Procedure: Anterior Cervical Discecectomy Fusion - Cerivcal Five-Cervical Six;  Surgeon: Kary Kos, MD;  Location: [place];  Service: Neurosurgery;  Laterality: N/A;  Anterior Cervical Discecectomy Fusion - Cerivcal Five-Cervical Six   ANTERIOR CERVICAL DISCECTOMY  09/06/2019   APPENDECTOMY  1980   COLONOSCOPY WITH PROPOFOL N/A 12/10/2016   Procedure: COLONOSCOPY WITH PROPOFOL;  Surgeon: [name], MD;  Location: [facility];  Service: Endoscopy;  Laterality: N/A;   HIP SURGERY Right    SHOULDER ARTHROSCOPY Right        Family History  Problem Relation Age of Onset   Emphysema Mother    Allergies Mother        "  everyone in family"   Asthma Mother        "everyone in family"   Heart disease Mother    Clotting disorder Mother    Cancer Mother    Emphysema Father    Allergies Father    Allergies Son    Seizures Brother    Diabetes Brother     Social History   Tobacco Use   Smoking status: Some Days    Packs/day: 0.00    Years: 36.00    Pack years: 0.00    Types: Cigarettes   Smokeless tobacco: Never   Tobacco comments:    smokes 2 cigarettes per day 11/06/2020  Vaping Use   Vaping Use: Never used   Substance Use Topics   Alcohol use: Not Currently    Alcohol/week: 14.0 - 21.0 standard drinks    Types: 14 - 21 Standard drinks or equivalent per week    Comment: 2-3 beers/day, with occasional "binges".  No history of withdrawal   Drug use: Not Currently    Types: Marijuana, Cocaine    Comment: last cocaine use last month    Home Medications Prior to Admission medications   Medication Sig Start Date End Date Taking? Authorizing Provider  albuterol (PROVENTIL) (2.5 MG/3ML) 0.083% nebulizer solution Take 3 mLs (2.5 mg total) by nebulization every 4 (four) hours as needed for wheezing or shortness of breath. 09/24/21 09/24/22  Fransico Meadow, PA-C  albuterol (VENTOLIN HFA) 108 (90 Base) MCG/ACT inhaler Inhale 2 puffs into the lungs every 6 (six) hours as needed for wheezing or shortness of breath. 09/24/21   Fransico Meadow, PA-C  atorvastatin (LIPITOR) 40 MG tablet TAKE ONE TABLET BY MOUTH DAILY Patient taking differently: Take 40 mg by mouth daily. 07/06/20   [name], MD  azithromycin (ZITHROMAX Z-PAK) 250 MG tablet Take 1 tablet (250 mg total) by mouth daily. Take one tablet a day beginning 11/29 09/24/21   Fransico Meadow, PA-C  benztropine (COGENTIN) 0.5 MG tablet Take 0.5 mg by mouth at bedtime.     [provider]  Budeson-Glycopyrrol-Formoterol (BREZTRI AEROSPHERE) 160-9-4.8 MCG/ACT AERO Inhale 2 puffs into the lungs in the morning and at bedtime. 07/25/21   [name], NP  Budeson-Glycopyrrol-Formoterol (BREZTRI AEROSPHERE) 160-9-4.8 MCG/ACT AERO Inhale 2 puffs into the lungs in the morning and at bedtime. Patient not taking: Reported on 09/07/2021 07/25/21   [name], NP  cetirizine (ZYRTEC) 10 MG tablet Take 10 mg by mouth daily.    [provider]  diclofenac Sodium (VOLTAREN) 1 % GEL Apply 2 g topically 4 (four) times daily.    [provider]  dorzolamide (TRUSOPT) 2 % ophthalmic solution Place 1 drop into the left eye 2 (two)  times daily. 12/12/20   [provider]  famotidine (PEPCID) 20 MG tablet TAKE 1 TABLET (20 MG TOTAL) BY MOUTH AT BEDTIME. 02/05/21   [name], MD  fluticasone (FLONASE) 50 MCG/ACT nasal spray Place 2 sprays into both nostrils 3 (three) times daily as needed for allergies.     [provider]  furosemide (LASIX) 20 MG tablet Take 1 tablet (20 mg total) by mouth daily. Patient not taking: No sig reported 05/01/21   [name], MD  hydrochlorothiazide (HYDRODIURIL) 25 MG tablet Take 1 tablet (25 mg total) by mouth daily. 09/12/21   Tolia, Sunit, DO  latanoprost (XALATAN) 0.005 % ophthalmic solution Place 1 drop into both eyes at bedtime. 05/24/19   [provider]  LORazepam (ATIVAN) 1 MG tablet Take 1 tablet (1 mg total) by mouth 3 (three) times daily as needed for anxiety. Patient not taking: No sig reported 02/15/21   Daleen Bo, MD  losartan (COZAAR) 100 MG tablet Take 100 mg by mouth daily.    [provider]  loxapine (LOXITANE) 10 MG capsule Take 10 mg by mouth at bedtime.     [provider]  megestrol (MEGACE) 40 MG tablet Take 40 mg by mouth daily.    [provider]  mirtazapine (REMERON) 30 MG tablet Take 30 mg by mouth at bedtime.    [provider]  Multiple Vitamin (MULTIVITAMIN) capsule Take 1 capsule by mouth daily.    [provider]  mupirocin cream (BACTROBAN) 2 % Apply 1 application topically 2 (two) times daily. Patient not taking: No sig reported 05/01/21   [name], MD  OXYGEN Inhale 3-4 L into the lungs as directed. 3-4 liters with sleep and exertion as needed for low oxygen    [provider]  pantoprazole (PROTONIX) 40 MG tablet Take 40 mg by mouth daily.    [provider]  polyethylene glycol (MIRALAX / GLYCOLAX) 17 g packet TAKE 17 GRAMS BY MOUTH DAILY Patient taking differently: Take 17 g by mouth daily as needed for mild constipation. 03/09/20   [name], MD   potassium chloride SA (KLOR-CON) 20 MEQ tablet Take 1 tablet (20 mEq total) by mouth 2 (two) times daily. Patient not taking: No sig reported 02/15/21   Daleen Bo, MD  predniSONE (DELTASONE) 10 MG tablet TAKE ONE TO TWO TABLETS BY MOUTH DAILY AS DIRECTED Patient taking differently: Take 20 mg by mouth daily at 6 (six) AM. 07/18/21   [name], MD  risperiDONE (RISPERDAL) 2 MG tablet Take 2 mg by mouth at bedtime.    [provider]    Allergies    Penicillins and Levocetirizine  Review of Systems   Review of Systems  Constitutional:  Negative for chills and fever.  HENT:  Positive for congestion. Negative for sore throat.   Eyes: Negative.   Respiratory:  Positive for cough and shortness of breath. Negative for chest tightness.   Cardiovascular:  Negative for chest pain.  Gastrointestinal:  Negative for abdominal pain, nausea and vomiting.  Genitourinary: Negative.   Musculoskeletal:  Negative for arthralgias, joint swelling and neck pain.  Skin: Negative.  Negative for rash and wound.  Neurological:  Negative for dizziness, weakness, light-headedness, numbness and headaches.  Psychiatric/Behavioral: Negative.     Physical Exam Updated Vital Signs BP (!) 160/88   Pulse 81   Temp 98.2 F (36.8 C) (Oral)   Resp 17   SpO2 97%   Physical Exam Vitals and nursing note reviewed.  Constitutional:      Appearance: He is well-developed.  HENT:     Head: Normocephalic and atraumatic.  Eyes:     Conjunctiva/sclera: Conjunctivae normal.  Cardiovascular:     Rate and Rhythm: Normal rate and regular rhythm.     Heart sounds: Normal heart sounds.  Pulmonary:     Effort: Respiratory distress present.     Breath sounds: Wheezing present.     Comments: Wheezing throughout all lung [place] with prolonged expirations. Abdominal:     General: Bowel sounds are normal.     Palpations: Abdomen is soft.     Tenderness: There is no abdominal tenderness.  Musculoskeletal:         General: Normal range of motion.  Cervical back: Normal range of motion.     Right lower leg: No edema.     Left lower leg: No edema.  Skin:    General: Skin is warm and dry.  Neurological:     Mental Status: He is alert.    ED Results / Procedures / Treatments   Labs (all labs ordered are listed, but only abnormal results are displayed) Labs Reviewed  CBC - Abnormal; Notable for the following components:      Result Value   WBC 12.2 (*)    MCV 100.4 (*)    All other components within normal limits  RESP PANEL BY RT-PCR (FLU A&B, COVID) ARPGX2  BASIC METABOLIC PANEL  TROPONIN I (HIGH SENSITIVITY)  TROPONIN I (HIGH SENSITIVITY)    EKG EKG Interpretation  Date/Time:  Monday October 01 2021 13:09:54 EST Ventricular Rate:  67 PR Interval:  132 QRS Duration: 80 QT Interval:  406 QTC Calculation: 429 R Axis:   -87 Text Interpretation: Normal sinus rhythm Left axis deviation Abnormal ECG No significant change since prior 11/22 Confirmed by Aletta Edouard [phone]) on 10/01/2021 1:14:21 PM  Radiology DG Chest 2 View  Result Date: 10/01/2021 CLINICAL DATA:  Chest pain with cough for more than 1 week. History of COPD. EXAM: CHEST - 2 VIEW COMPARISON:  Radiographs 09/24/2021.  CT 05/12/2018. FINDINGS: The PA view was repeated. The heart size and mediastinal contours are stable. The lungs are hyperinflated but clear. No evidence of pleural effusion or pneumothorax. Old right-sided rib fracture noted. No acute osseous findings. IMPRESSION: Stable changes of chronic obstructive pulmonary disease. No acute cardiopulmonary process identified. Electronically Signed   By: Richardean Sale M.D.   On: 10/01/2021 13:36    Procedures Procedures   Medications Ordered in ED Medications  albuterol (PROVENTIL,VENTOLIN) solution continuous neb (has no administration in time range)  albuterol (PROVENTIL) (2.5 MG/3ML) 0.083% nebulizer solution (has no administration in time range)   albuterol (VENTOLIN HFA) 108 (90 Base) MCG/ACT inhaler 3 puff (3 puffs Inhalation Given 10/01/21 1950)  ipratropium-albuterol (DUONEB) 0.5-2.5 (3) MG/3ML nebulizer solution 3 mL (3 mLs Nebulization Given 10/01/21 2345)  predniSONE (DELTASONE) tablet 60 mg (60 mg Oral Given 10/01/21 2327)  albuterol (PROVENTIL) (2.5 MG/3ML) 0.083% nebulizer solution 10 mg (10 mg Nebulization Given 10/02/21 0026)    ED Course  I have reviewed the triage vital signs and the nursing notes.  Pertinent labs & imaging results that were available during my care of the patient were reviewed by me and considered in my medical decision making (see chart for details).    MDM Rules/Calculators/A&P                           Patient examined after he received his second albuterol treatment, the first was in MDI, the second was a full neb treatment after his respiratory panel was negative.  He continues to have shortness of breath after the second treatment, although his wheezing is lessened after the neb treatment, his air movement is poor.  At rest with 3 L nasal cannula, pulse ox drops to 84% when he is speaking.  Improves to 90 to 91% when quiet.  Pt with acute COPD flare with need for admission.  2nd hour long neb ordered.   Pt seen by Dr Dayna Barker who agrees with plan.  Final Clinical Impression(s) / ED Diagnoses Final diagnoses:  Chronic obstructive pulmonary disease with acute exacerbation ([place])    Rx / DC  Orders ED Discharge Orders     None        Landis Martins 10/02/21 0101    Mesner, [name], MD 10/02/21 [phone]

## 2021-10-02 ENCOUNTER — Other Ambulatory Visit: Payer: Self-pay

## 2021-10-02 DIAGNOSIS — E782 Mixed hyperlipidemia: Secondary | ICD-10-CM

## 2021-10-02 DIAGNOSIS — K219 Gastro-esophageal reflux disease without esophagitis: Secondary | ICD-10-CM | POA: Diagnosis not present

## 2021-10-02 DIAGNOSIS — J9611 Chronic respiratory failure with hypoxia: Secondary | ICD-10-CM

## 2021-10-02 DIAGNOSIS — I10 Essential (primary) hypertension: Secondary | ICD-10-CM

## 2021-10-02 DIAGNOSIS — J441 Chronic obstructive pulmonary disease with (acute) exacerbation: Principal | ICD-10-CM

## 2021-10-02 DIAGNOSIS — D72829 Elevated white blood cell count, unspecified: Secondary | ICD-10-CM

## 2021-10-02 DIAGNOSIS — E46 Unspecified protein-calorie malnutrition: Secondary | ICD-10-CM

## 2021-10-02 DIAGNOSIS — E8809 Other disorders of plasma-protein metabolism, not elsewhere classified: Secondary | ICD-10-CM

## 2021-10-02 DIAGNOSIS — E876 Hypokalemia: Secondary | ICD-10-CM

## 2021-10-02 LAB — COMPREHENSIVE METABOLIC PANEL
ALT: 27 U/L (ref 0–44)
AST: 27 U/L (ref 15–41)
Albumin: 3.2 g/dL — ABNORMAL LOW (ref 3.5–5.0)
Alkaline Phosphatase: 60 U/L (ref 38–126)
Anion gap: 9 (ref 5–15)
BUN: 16 mg/dL (ref 6–20)
CO2: 29 mmol/L (ref 22–32)
Calcium: 8.6 mg/dL — ABNORMAL LOW (ref 8.9–10.3)
Chloride: 99 mmol/L (ref 98–111)
Creatinine, Ser: 1.03 mg/dL (ref 0.61–1.24)
GFR, Estimated: >60 mL/min (ref >60)
Glucose, Bld: 229 mg/dL — ABNORMAL HIGH (ref 70–99)
Potassium: 2.9 mmol/L — ABNORMAL LOW (ref 3.5–5.1)
Sodium: 137 mmol/L (ref 135–145)
Total Bilirubin: 0.5 mg/dL (ref 0.3–1.2)
Total Protein: 6 g/dL — ABNORMAL LOW (ref 6.5–8.1)

## 2021-10-02 LAB — BASIC METABOLIC PANEL
Anion gap: 9 (ref 5–15)
BUN: 19 mg/dL (ref 6–20)
CO2: 26 mmol/L (ref 22–32)
Calcium: 8.4 mg/dL — ABNORMAL LOW (ref 8.9–10.3)
Chloride: 101 mmol/L (ref 98–111)
Creatinine, Ser: 1.01 mg/dL (ref 0.61–1.24)
GFR, Estimated: >60 mL/min (ref >60)
Glucose, Bld: 136 mg/dL — ABNORMAL HIGH (ref 70–99)
Potassium: 3.6 mmol/L (ref 3.5–5.1)
Sodium: 136 mmol/L (ref 135–145)

## 2021-10-02 LAB — CBC
HCT: 43.5 % (ref 39.0–52.0)
HCT: 43.9 % (ref 39.0–52.0)
Hemoglobin: 13.7 g/dL (ref 13.0–17.0)
Hemoglobin: 13.9 g/dL (ref 13.0–17.0)
MCH: 31.5 pg (ref 26.0–34.0)
MCH: 31.7 pg (ref 26.0–34.0)
MCHC: 31.5 g/dL (ref 30.0–36.0)
MCHC: 31.7 g/dL (ref 30.0–36.0)
MCV: 100 fL (ref 80.0–100.0)
MCV: 100 fL (ref 80.0–100.0)
Platelets: 227 10*3/uL (ref 150–400)
Platelets: 231 10*3/uL (ref 150–400)
RBC: 4.35 MIL/uL (ref 4.22–5.81)
RBC: 4.39 MIL/uL (ref 4.22–5.81)
RDW: 14.5 % (ref 11.5–15.5)
RDW: 14.5 % (ref 11.5–15.5)
WBC: 11.3 10*3/uL — ABNORMAL HIGH (ref 4.0–10.5)
WBC: 11.5 10*3/uL — ABNORMAL HIGH (ref 4.0–10.5)
nRBC: 0 % (ref 0.0–0.2)
nRBC: 0 % (ref 0.0–0.2)

## 2021-10-02 LAB — GLUCOSE, CAPILLARY: Glucose-Capillary: 137 mg/dL — ABNORMAL HIGH (ref 70–99)

## 2021-10-02 LAB — PHOSPHORUS: Phosphorus: 1.7 mg/dL — ABNORMAL LOW (ref 2.5–4.6)

## 2021-10-02 LAB — HIV ANTIBODY (ROUTINE TESTING W REFLEX): HIV Screen 4th Generation wRfx: NONREACTIVE

## 2021-10-02 LAB — CREATININE, SERUM
Creatinine, Ser: 1.01 mg/dL (ref 0.61–1.24)
GFR, Estimated: >60 mL/min (ref >60)

## 2021-10-02 LAB — MRSA NEXT GEN BY PCR, NASAL: MRSA by PCR Next Gen: NOT DETECTED

## 2021-10-02 LAB — MAGNESIUM: Magnesium: 1.9 mg/dL (ref 1.7–2.4)

## 2021-10-02 MED ORDER — GLUCERNA SHAKE PO LIQD
237.0000 mL | Freq: Three times a day (TID) | ORAL | 0 refills | Status: DC
Start: 2021-10-02 — End: 2021-10-30

## 2021-10-02 MED ORDER — AZITHROMYCIN 250 MG PO TABS: 250.0000 mg | ORAL_TABLET | Freq: Every day | ORAL | Status: DC

## 2021-10-02 MED ORDER — DOXYCYCLINE HYCLATE 100 MG PO TABS
100.0000 mg | ORAL_TABLET | Freq: Once | ORAL | Status: AC
  Administered 2021-10-02: 100 mg via ORAL
  Filled 2021-10-02: qty 1

## 2021-10-02 MED ORDER — CHLORHEXIDINE GLUCONATE CLOTH 2 % EX PADS
6.0000 | MEDICATED_PAD | Freq: Every day | CUTANEOUS | Status: DC
  Administered 2021-10-02: 6 via TOPICAL

## 2021-10-02 MED ORDER — PANTOPRAZOLE SODIUM 40 MG PO TBEC
40.0000 mg | DELAYED_RELEASE_TABLET | Freq: Every day | ORAL | Status: DC
  Administered 2021-10-02: 40 mg via ORAL
  Filled 2021-10-02: qty 1

## 2021-10-02 MED ORDER — ATORVASTATIN CALCIUM 40 MG PO TABS
40.0000 mg | ORAL_TABLET | Freq: Every day | ORAL | Status: DC
  Administered 2021-10-02: 40 mg via ORAL
  Filled 2021-10-02: qty 1

## 2021-10-02 MED ORDER — ALBUTEROL (5 MG/ML) CONTINUOUS INHALATION SOLN
10.0000 mg/h | INHALATION_SOLUTION | Freq: Once | RESPIRATORY_TRACT | Status: DC
  Filled 2021-10-02: qty 20

## 2021-10-02 MED ORDER — GLUCERNA SHAKE PO LIQD
237.0000 mL | Freq: Three times a day (TID) | ORAL | Status: DC
  Administered 2021-10-02 (×2): 237 mL via ORAL

## 2021-10-02 MED ORDER — IPRATROPIUM-ALBUTEROL 0.5-2.5 (3) MG/3ML IN SOLN
3.0000 mL | Freq: Four times a day (QID) | RESPIRATORY_TRACT | Status: DC
  Administered 2021-10-02 (×2): 3 mL via RESPIRATORY_TRACT

## 2021-10-02 MED ORDER — POTASSIUM PHOSPHATES 15 MMOLE/5ML IV SOLN
30.0000 mmol | Freq: Once | INTRAVENOUS | Status: DC
  Filled 2021-10-02: qty 10

## 2021-10-02 MED ORDER — PREDNISONE 20 MG PO TABS: 50.0000 mg | ORAL_TABLET | Freq: Every day | ORAL | Status: DC

## 2021-10-02 MED ORDER — ENOXAPARIN SODIUM 40 MG/0.4ML IJ SOSY
40.0000 mg | PREFILLED_SYRINGE | INTRAMUSCULAR | Status: DC
  Administered 2021-10-02: 40 mg via SUBCUTANEOUS
  Filled 2021-10-02: qty 0.4

## 2021-10-02 MED ORDER — IPRATROPIUM-ALBUTEROL 0.5-2.5 (3) MG/3ML IN SOLN: 3.0000 mL | RESPIRATORY_TRACT | Status: DC | PRN

## 2021-10-02 MED ORDER — METHYLPREDNISOLONE SODIUM SUCC 40 MG IJ SOLR
40.0000 mg | Freq: Two times a day (BID) | INTRAMUSCULAR | Status: DC
  Filled 2021-10-02: qty 1

## 2021-10-02 MED ORDER — PREDNISONE 10 MG PO TABS: ORAL_TABLET | ORAL | 0 refills | Status: DC

## 2021-10-02 MED ORDER — DM-GUAIFENESIN ER 30-600 MG PO TB12
1.0000 | ORAL_TABLET | Freq: Two times a day (BID) | ORAL | Status: DC
  Administered 2021-10-02: 1 via ORAL
  Filled 2021-10-02: qty 1

## 2021-10-02 MED ORDER — DM-GUAIFENESIN ER 30-600 MG PO TB12: 1.0000 | ORAL_TABLET | Freq: Two times a day (BID) | ORAL | 0 refills | Status: DC

## 2021-10-02 MED ORDER — LOSARTAN POTASSIUM 50 MG PO TABS: 100.0000 mg | ORAL_TABLET | Freq: Every day | ORAL | Status: DC

## 2021-10-02 MED ORDER — ALBUTEROL SULFATE (2.5 MG/3ML) 0.083% IN NEBU: 2.5000 mg | INHALATION_SOLUTION | RESPIRATORY_TRACT | 2 refills | Status: DC | PRN

## 2021-10-02 MED ORDER — AZITHROMYCIN 250 MG PO TABS
500.0000 mg | ORAL_TABLET | Freq: Every day | ORAL | Status: AC
  Administered 2021-10-02: 500 mg via ORAL
  Filled 2021-10-02: qty 2

## 2021-10-02 MED ORDER — K PHOS MONO-SOD PHOS DI & MONO 155-852-130 MG PO TABS
500.0000 mg | ORAL_TABLET | Freq: Four times a day (QID) | ORAL | Status: DC
  Administered 2021-10-02 (×2): 500 mg via ORAL
  Filled 2021-10-02 (×2): qty 2

## 2021-10-02 MED ORDER — ALBUTEROL SULFATE (2.5 MG/3ML) 0.083% IN NEBU
10.0000 mg | INHALATION_SOLUTION | Freq: Once | RESPIRATORY_TRACT | Status: AC
  Administered 2021-10-02: 10 mg via RESPIRATORY_TRACT

## 2021-10-02 MED ORDER — ALBUTEROL SULFATE (2.5 MG/3ML) 0.083% IN NEBU
INHALATION_SOLUTION | RESPIRATORY_TRACT | Status: AC
  Filled 2021-10-02: qty 12

## 2021-10-02 MED ORDER — ALBUTEROL SULFATE (2.5 MG/3ML) 0.083% IN NEBU
10.0000 mg | INHALATION_SOLUTION | Freq: Once | RESPIRATORY_TRACT | Status: AC
Start: 2021-10-02 — End: 2021-10-02
  Administered 2021-10-02: 10 mg via RESPIRATORY_TRACT
  Filled 2021-10-02: qty 12

## 2021-10-02 MED ORDER — POLYETHYLENE GLYCOL 3350 17 G PO PACK: 17.0000 g | PACK | Freq: Every day | ORAL | Status: DC | PRN

## 2021-10-02 MED ORDER — POTASSIUM CHLORIDE CRYS ER 20 MEQ PO TBCR
40.0000 meq | EXTENDED_RELEASE_TABLET | Freq: Once | ORAL | Status: AC
  Administered 2021-10-02: 40 meq via ORAL
  Filled 2021-10-02: qty 2

## 2021-10-02 NOTE — Progress Notes (Signed)
[facility] PLAN OF CARE NOTE Patient: Tony Sullivan OOI:[id number]   PCP: [name], FNP DOB: Jun 28, 1961   DOA: 10/01/2021   DOS: 10/02/2021    Patient was admitted by my colleague earlier on 10/02/2021. I have reviewed the H&P as well as assessment and plan and agree with the same. Important changes in the plan are listed below.  Plan of care: Principal Problem:   Acute exacerbation of chronic obstructive pulmonary disease (COPD) ([place]) Active Problems:   Chronic respiratory failure with hypoxia (HCC)   Essential hypertension   GERD (gastroesophageal reflux disease)   Mixed hyperlipidemia   Hypokalemia   Leukocytosis   Hypophosphatemia   Hypoalbuminemia due to protein-calorie malnutrition [facility])    Patient admitted last night with a complaint of shortness of breath with concern for COPD exacerbation.  Currently has no wheezing.  On his home oxygen.  Chest x-ray clear for any pneumonia.  Influenza and COVID-negative as well. Currently recommended the patient continues with nebulizer therapy at home.  Hypokalemia hypophosphatemia. Currently being replaced. Suspect hypokalemia is actually secondary to 2 hour-long albuterol nebulizer therapy that the patient received right before drawing the blood. We will recheck the blood work at 1 PM if it is negative patient will be going home today.  Author: Berle Mull, MD [facility] 10/02/2021 12:05 PM   If 7PM-7AM, please contact night-coverage at [email or web address]

## 2021-10-02 NOTE — H&P (Signed)
History and Physical  CORRION STIREWALT IOE:[id number] DOB: 04/24/1961 DOA: 10/01/2021  Referring physician: Merrily Pew, MD PCP: [name], FNP  Patient coming from: Home  Chief Complaint: Shortness of breath  HPI: Tony Sullivan is a 60 y.o. male with medical history significant for COPD on 3 LPM of home oxygen, GERD, hypertension, anxiety, osteoarthritis, tobacco and cocaine use and history of cervical fusion who presents to the emergency department due to 1 week history of increasing shortness of breath and nonproductive cough.  Patient complained of worsening wheezing, he has run out of home Albuterol MDI, but has been using home nebulizer treatments and Breztri (started 2 months ago).  Patient complained of nasal congestion and clear productive sputum on coughing.  Due to the worsening shortness of breath, he was afraid of not being able to get enough air, so he decided to go to the ED for further evaluation and management.  ED Course:  In the ED emergency department, he was hemodynamically stable.  Work-up in the ED showed leukocytosis, hypokalemia, hyperglycemia, hypoalbuminemia, hypophosphatemia, troponin x2 was negative.  Influenza A, B, SARS coronavirus 2 was negative. Chest x-ray showed no acute cardiopulmonary process Breathing treatment was provided, doxycycline was given.  Hospitalist was asked to admit patient for further evaluation and management.  Review of Systems: Constitutional: Negative for chills and fever.  HENT: Positive for nasal congestion.  Negative for ear pain and sore throat.   Eyes: Negative for pain and visual disturbance.  Respiratory: Positive for cough and shortness of breath.   Cardiovascular: Negative for chest pain and palpitations.  Gastrointestinal: Negative for abdominal pain and vomiting.  Endocrine: Negative for polyphagia and polyuria.  Genitourinary: Negative for decreased urine volume, dysuria, enuresis Musculoskeletal: Negative for  arthralgias and back pain.  Skin: Negative for color change and rash.  Allergic/Immunologic: Negative for immunocompromised state.  Neurological: Negative for tremors, syncope, speech difficulty, weakness, light-headedness and headaches.  Hematological: Does not bruise/bleed easily.  All other systems reviewed and are negative   Past Medical History:  Diagnosis Date   Anxiety    Arthritis    COPD (chronic obstructive pulmonary disease) ([place]) 2004   diagnosed in 2004, no PFT's to date.  Started on home O2 12/2013, after found to be desatting at PCP's office, and referred to pulmonology.   Depression    GERD (gastroesophageal reflux disease)    High blood pressure    Prediabetes    Seasonal allergies    Tobacco dependence    Past Surgical History:  Procedure Laterality Date   ANTERIOR CERVICAL DECOMP/DISCECTOMY FUSION N/A 09/06/2019   Procedure: Anterior Cervical Discecectomy Fusion - Cerivcal Five-Cervical Six;  Surgeon: Kary Kos, MD;  Location: [facility];  Service: Neurosurgery;  Laterality: N/A;  Anterior Cervical Discecectomy Fusion - Cerivcal Five-Cervical Six   ANTERIOR CERVICAL DISCECTOMY  09/06/2019   APPENDECTOMY  1980   COLONOSCOPY WITH PROPOFOL N/A 12/10/2016   Procedure: COLONOSCOPY WITH PROPOFOL;  Surgeon: [name], MD;  Location: [facility];  Service: Endoscopy;  Laterality: N/A;   HIP SURGERY Right    SHOULDER ARTHROSCOPY Right     Social History:  reports that he has been smoking cigarettes. He has never used smokeless tobacco. He reports that he does not currently use alcohol after a past usage of about 14.0 - 21.0 standard drinks per week. He reports that he does not currently use drugs after having used the following drugs: Marijuana and Cocaine.   Allergies  Allergen Reactions  Penicillins Anaphylaxis    Has patient had a PCN reaction causing immediate rash, facial/tongue/throat swelling, SOB or lightheadedness with hypotension: Yes Has patient had a PCN  reaction causing severe rash involving mucus membranes or skin necrosis: No Has patient had a PCN reaction that required hospitalization No Has patient had a PCN reaction occurring within the last 10 years: No If all of the above answers are "NO", then may proceed with Cephalosporin use.    Levocetirizine Other (See Comments)    Muscle cramps    Family History  Problem Relation Age of Onset   Emphysema Mother    Allergies Mother        "everyone in family"   Asthma Mother        "everyone in family"   Heart disease Mother    Clotting disorder Mother    Cancer Mother    Emphysema Father    Allergies Father    Allergies Son    Seizures Brother    Diabetes Brother      Prior to Admission medications   Medication Sig Start Date End Date Taking? Authorizing Provider  albuterol (PROVENTIL) (2.5 MG/3ML) 0.083% nebulizer solution Take 3 mLs (2.5 mg total) by nebulization every 4 (four) hours as needed for wheezing or shortness of breath. 09/24/21 09/24/22  Fransico Meadow, PA-C  albuterol (VENTOLIN HFA) 108 (90 Base) MCG/ACT inhaler Inhale 2 puffs into the lungs every 6 (six) hours as needed for wheezing or shortness of breath. 09/24/21   Fransico Meadow, PA-C  atorvastatin (LIPITOR) 40 MG tablet TAKE ONE TABLET BY MOUTH DAILY Patient taking differently: Take 40 mg by mouth daily. 07/06/20   [name], MD  azithromycin (ZITHROMAX Z-PAK) 250 MG tablet Take 1 tablet (250 mg total) by mouth daily. Take one tablet a day beginning 11/29 09/24/21   Fransico Meadow, PA-C  benztropine (COGENTIN) 0.5 MG tablet Take 0.5 mg by mouth at bedtime.     [provider]  Budeson-Glycopyrrol-Formoterol (BREZTRI AEROSPHERE) 160-9-4.8 MCG/ACT AERO Inhale 2 puffs into the lungs in the morning and at bedtime. 07/25/21   [name], NP  Budeson-Glycopyrrol-Formoterol (BREZTRI AEROSPHERE) 160-9-4.8 MCG/ACT AERO Inhale 2 puffs into the lungs in the morning and at bedtime. Patient not taking:  Reported on 09/07/2021 07/25/21   [name], NP  cetirizine (ZYRTEC) 10 MG tablet Take 10 mg by mouth daily.    [provider]  diclofenac Sodium (VOLTAREN) 1 % GEL Apply 2 g topically 4 (four) times daily.    [provider]  dorzolamide (TRUSOPT) 2 % ophthalmic solution Place 1 drop into the left eye 2 (two) times daily. 12/12/20   [provider]  famotidine (PEPCID) 20 MG tablet TAKE 1 TABLET (20 MG TOTAL) BY MOUTH AT BEDTIME. 02/05/21   [name], MD  fluticasone (FLONASE) 50 MCG/ACT nasal spray Place 2 sprays into both nostrils 3 (three) times daily as needed for allergies.     [provider]  furosemide (LASIX) 20 MG tablet Take 1 tablet (20 mg total) by mouth daily. Patient not taking: No sig reported 05/01/21   [name], MD  hydrochlorothiazide (HYDRODIURIL) 25 MG tablet Take 1 tablet (25 mg total) by mouth daily. 09/12/21   Tolia, Sunit, DO  latanoprost (XALATAN) 0.005 % ophthalmic solution Place 1 drop into both eyes at bedtime. 05/24/19   [provider]  LORazepam (ATIVAN) 1 MG tablet Take 1 tablet (1 mg total) by mouth 3 (three) times daily  as needed for anxiety. Patient not taking: No sig reported 02/15/21   Daleen Bo, MD  losartan (COZAAR) 100 MG tablet Take 100 mg by mouth daily.    [provider]  loxapine (LOXITANE) 10 MG capsule Take 10 mg by mouth at bedtime.     [provider]  megestrol (MEGACE) 40 MG tablet Take 40 mg by mouth daily.    [provider]  mirtazapine (REMERON) 30 MG tablet Take 30 mg by mouth at bedtime.    [provider]  Multiple Vitamin (MULTIVITAMIN) capsule Take 1 capsule by mouth daily.    [provider]  mupirocin cream (BACTROBAN) 2 % Apply 1 application topically 2 (two) times daily. Patient not taking: No sig reported 05/01/21   [name], MD  OXYGEN Inhale 3-4 L into the lungs as directed. 3-4 liters with sleep and exertion as needed  for low oxygen    [provider]  pantoprazole (PROTONIX) 40 MG tablet Take 40 mg by mouth daily.    [provider]  polyethylene glycol (MIRALAX / GLYCOLAX) 17 g packet TAKE 17 GRAMS BY MOUTH DAILY Patient taking differently: Take 17 g by mouth daily as needed for mild constipation. 03/09/20   [name], MD  potassium chloride SA (KLOR-CON) 20 MEQ tablet Take 1 tablet (20 mEq total) by mouth 2 (two) times daily. Patient not taking: No sig reported 02/15/21   Daleen Bo, MD  predniSONE (DELTASONE) 10 MG tablet TAKE ONE TO TWO TABLETS BY MOUTH DAILY AS DIRECTED Patient taking differently: Take 20 mg by mouth daily at 6 (six) AM. 07/18/21   [name], MD  risperiDONE (RISPERDAL) 2 MG tablet Take 2 mg by mouth at bedtime.    [provider]    Physical Exam: BP (!) 115/58   Pulse 83   Temp 97.9 F (36.6 C) (Oral)   Resp 18   Ht 5\' 11"  (1.803 m)   Wt 68.1 kg   SpO2 95%   BMI 20.94 kg/m   General: 60 y.o. year-old male well developed well nourished in no acute distress.  Alert and oriented x3. HEENT: NCAT, EOMI Neck: Supple, trachea medial Cardiovascular: Regular rate and rhythm with no rubs or gallops.  No thyromegaly or JVD noted.  No lower extremity edema. 2/4 pulses in all 4 extremities. Respiratory: Diffuse expiratory wheezes on auscultation.  No rales.  Abdomen: Soft, nontender nondistended with normal bowel sounds x4 quadrants. Muskuloskeletal: No cyanosis, clubbing or edema noted bilaterally Neuro: CN II-XII intact, strength 5/5 x 4, sensation, reflexes intact Skin: No ulcerative lesions noted or rashes Psychiatry: Judgement and insight appear normal. Mood is appropriate for condition and setting          Labs on Admission:  Basic Metabolic Panel: Recent Labs  Lab 10/01/21 1319 10/02/21 0417  NA 137 137  K 3.7 2.9*  CL 100 99  CO2 31 29  GLUCOSE 96 229*  BUN 18 16  CREATININE 1.00 1.01  1.03  CALCIUM 9.0 8.6*  MG  --  1.9   PHOS  --  1.7*   Liver Function Tests: Recent Labs  Lab 10/02/21 0417  AST 27  ALT 27  ALKPHOS 60  BILITOT 0.5  PROT 6.0*  ALBUMIN 3.2*   No results for input(s): LIPASE, AMYLASE in the last 168 hours. No results for input(s): AMMONIA in the last 168 hours. CBC: Recent Labs  Lab 10/01/21 1319 10/02/21 0417  WBC 12.2* 11.3*  11.5*  HGB 15.4 13.7  13.9  HCT 49.1 43.5  43.9  MCV 100.4* 100.0  100.0  PLT 249 231  227   Cardiac Enzymes: No results for input(s): CKTOTAL, CKMB, CKMBINDEX, TROPONINI in the last 168 hours.  BNP (last 3 results) Recent Labs    02/15/21 0233 03/31/21 0743 06/03/21 0311  BNP 55.0 38.0 41.0    ProBNP (last 3 results) No results for input(s): PROBNP in the last 8760 hours.  CBG: No results for input(s): GLUCAP in the last 168 hours.  Radiological Exams on Admission: DG Chest 2 View  Result Date: 10/01/2021 CLINICAL DATA:  Chest pain with cough for more than 1 week. History of COPD. EXAM: CHEST - 2 VIEW COMPARISON:  Radiographs 09/24/2021.  CT 05/12/2018. FINDINGS: The PA view was repeated. The heart size and mediastinal contours are stable. The lungs are hyperinflated but clear. No evidence of pleural effusion or pneumothorax. Old right-sided rib fracture noted. No acute osseous findings. IMPRESSION: Stable changes of chronic obstructive pulmonary disease. No acute cardiopulmonary process identified. Electronically Signed   By: Richardean Sale M.D.   On: 10/01/2021 13:36    EKG: I independently viewed the EKG done and my findings are as followed: Normal sinus rhythm at a rate 67 bpm  Assessment/Plan Present on Admission:  Acute exacerbation of chronic obstructive pulmonary disease (COPD) ([facility])  Chronic respiratory failure with hypoxia ([place])  Essential hypertension  GERD (gastroesophageal reflux disease)  Leukocytosis  Hypokalemia  Principal Problem:   Acute exacerbation of chronic obstructive pulmonary disease (COPD)  ([place]) Active Problems:   Chronic respiratory failure with hypoxia ([facility])   Essential hypertension   GERD (gastroesophageal reflux disease)   Mixed hyperlipidemia   Hypokalemia   Leukocytosis   Hypophosphatemia   Hypoalbuminemia due to protein-calorie malnutrition ([facility])   Acute exacerbation of COPD Continue duo nebs, Mucinex, Solu-Medrol, azithromycin. Continue Protonix to prevent steroid-induced ulcer Continue incentive spirometry and flutter valve Continue supplemental oxygen to maintain O2 sat > 92%   Chronic respiratory failure with hypoxia Continue supplemental oxygen per home regimen  Leukocytosis possibly due to patient's chronic steroid use WBC 12.2, continue to monitor  Hypokalemia K+ is 2.9 K+ will be replenished Please monitor for AM K+ for further replenishmemnt  Hypophosphatemia Phosphorus 1.7, this will be replenished  Hyperglycemia due to prediabetes CBG 229, hemoglobin A1c 5 months ago was 6.1 Continue carb modified diet Continue CBG  Hypoalbuminemia due to mild protein calorie malnutrition Albumin 3.2, protein supplement will be provided  Essential hypertension Continue losartan  GERD Continue Protonix  Mixed hyperlipidemia Continue Lipitor  DVT prophylaxis: Lovenox  Code Status: Full code  Family Communication: None at bedside  Disposition Plan:  Patient is from:                        home Anticipated DC to:                   SNF or family members home Anticipated DC date:               2-3 days Anticipated DC barriers:          Patient requires inpatient management due to acute exertional COPD    Consults called: None  Admission status: Observation   Bernadette Hoit MD [facility]  10/02/2021, 8:45 AM

## 2021-10-02 NOTE — ED Provider Notes (Signed)
I provided a substantive portion of the care of this patient.  I personally performed the entirety of the history, exam, and medical decision making for this encounter.  All care prior to 2300 of this patient was provided by the previous provider.  I evaluated and spoke with the patient and made medical decision making after that point.  Patient here with worsening hypoxia secondary to COPD exacerbation.  Has been being treated for quite a while with the same.  Does finish course of steroids but has not been on any antibiotics.  Is increasing his oxygen at home without relief.  Some of his probably anxiety related.  On my exam he has diminished breath sounds with wheezing.  He easily desats below 90 with his oxygen on.  Is not clear if he is breathing through his nose or not.  No lower extremity swelling.  Tony Sullivan is having a worsening COPD exacerbation.  We will do a continuous albuterol and give him a dose of antibiotics.  Otherwise patient will require at least overnight observation to ensure improvement.  CRITICAL CARE Performed by: Merrily Pew Total critical care time: 35 minutes Critical care time was exclusive of separately billable procedures and treating other patients. Critical care was necessary to treat or prevent imminent or life-threatening deterioration. Critical care was time spent personally by me on the following activities: development of treatment plan with patient and/or surrogate as well as nursing, discussions with consultants, evaluation of patient's response to treatment, examination of patient, obtaining history from patient or surrogate, ordering and performing treatments and interventions, ordering and review of laboratory studies, ordering and review of radiographic studies, pulse oximetry and re-evaluation of patient's condition.  EKG Interpretation  Date/Time:  Monday October 01 2021 13:09:54 EST Ventricular Rate:  67 PR Interval:  132 QRS Duration: 80 QT  Interval:  406 QTC Calculation: 429 R Axis:   -87 Text Interpretation: Normal sinus rhythm Left axis deviation Abnormal ECG No significant change since prior 11/22 Confirmed by Aletta Edouard [phone]) on 10/01/2021 1:14:21 PM    Tony Sullivan, [name], MD 10/02/21 0236

## 2021-10-02 NOTE — Progress Notes (Addendum)
Patient alert and oriented x4. No complaints of pain, shortness of breath, chest pain, dizziness, nausea or vomiting. Patient tolerated PO meds and diet well, appetite good. Patient up out of bed to motorized wheelchair independently. IV removed without complications. Went over discharge instructions/summary, appointment follow ups and medication education outside main entrance to hospital due to patient leaving room before going over it. Patient expressed full understanding with teach back and all questions answered.  Patient discharged with all belongings for home via motorized wheelchair per patient preference. Dr Posey Pronto aware. Patient has home O2 tank which is half full and set at 4L via Nasal Cannula which patient said is plenty to get home and nothing else needed at this time.

## 2021-10-03 NOTE — Discharge Summary (Signed)
Physician Discharge Summary   Patient name: Tony Sullivan  Admit date:     10/01/2021  Discharge date: 10/02/2021   Discharge Physician: Berle Mull   PCP: [name], FNP   Recommendations at discharge: follow up with PCP in 1 week  Discharge Diagnoses Principal Problem:   Acute exacerbation of chronic obstructive pulmonary disease (COPD) ([place]) Active Problems:   Chronic respiratory failure with hypoxia ([place])   Essential hypertension   GERD (gastroesophageal reflux disease)   Mixed hyperlipidemia   Hypokalemia   Leukocytosis   Hypophosphatemia   Hypoalbuminemia due to protein-calorie malnutrition [facility])   Hospital Course   Acute exacerbation of COPD Patient admitted last night with a complaint of shortness of breath with concern for COPD exacerbation. Currently has no wheezing. On his home oxygen.   Chest x-ray clear for any pneumonia.   Influenza and COVID-negative as well. Currently recommended the patient continues with nebulizer therapy at home.  Chronic respiratory failure with hypoxia Continue supplemental oxygen per home regimen  Hypokalemia hypophosphatemia. Currently being replaced. Suspect hypokalemia is actually secondary to 2 hour-long albuterol nebulizer therapy that the patient received right before drawing the blood.  Hypoalbuminemia due to mild protein calorie malnutrition Albumin 3.2, protein supplement will be provided   Essential hypertension Continue losartan  GERD Continue Protonix  Mixed hyperlipidemia Continue Lipitor  Procedures performed: none    Condition at discharge: good  Exam General: Appear in mild distress, no Rash; Oral Mucosa Clear, moist. no Abnormal Neck Mass Or lumps, Conjunctiva normal  Cardiovascular: S1 and S2 Present, no Murmur, Respiratory: good respiratory effort, Bilateral Air entry present and CTA, no Crackles, no wheezes Abdomen: Bowel Sound present, Soft and no tenderness Extremities: no Pedal  edema Neurology: alert and oriented to time, place, and person affect appropriate. no new focal deficit  Disposition: Home  Discharge time: greater than 30 minutes.  Follow-up Information     [name], FNP. Schedule an appointment as soon as possible for a visit in 1 week(s).   Specialty: Nurse Practitioner Contact information: [address] [place] [phone]                 Allergies as of 10/02/2021       Reactions   Penicillins Anaphylaxis   Has patient had a PCN reaction causing immediate rash, facial/tongue/throat swelling, SOB or lightheadedness with hypotension: Yes Has patient had a PCN reaction causing severe rash involving mucus membranes or skin necrosis: No Has patient had a PCN reaction that required hospitalization No Has patient had a PCN reaction occurring within the last 10 years: No If all of the above answers are "NO", then may proceed with Cephalosporin use.   Levocetirizine Other (See Comments)   Muscle cramps        Medication List     STOP taking these medications    furosemide 20 MG tablet Commonly known as: LASIX   LORazepam 1 MG tablet Commonly known as: Ativan   losartan 100 MG tablet Commonly known as: COZAAR   mupirocin cream 2 % Commonly known as: BACTROBAN   potassium chloride SA 20 MEQ tablet Commonly known as: KLOR-CON M       TAKE these medications    albuterol 108 (90 Base) MCG/ACT inhaler Commonly known as: VENTOLIN HFA Inhale 2 puffs into the lungs every 6 (six) hours as needed for wheezing or shortness of breath.   albuterol (2.5 MG/3ML) 0.083% nebulizer solution Commonly known as:  PROVENTIL Take 3 mLs (2.5 mg total) by nebulization every 4 (four) hours as needed for wheezing or shortness of breath.   atorvastatin 40 MG tablet Commonly known as: LIPITOR TAKE ONE TABLET BY MOUTH DAILY   azithromycin 250 MG tablet Commonly known as: Zithromax Z-Pak Take 1  tablet (250 mg total) by mouth daily. Take one tablet a day beginning 11/29   benztropine 0.5 MG tablet Commonly known as: COGENTIN Take 0.5 mg by mouth at bedtime.   Breztri Aerosphere 160-9-4.8 MCG/ACT Aero Generic drug: Budeson-Glycopyrrol-Formoterol Inhale 2 puffs into the lungs in the morning and at bedtime.   Breztri Aerosphere 160-9-4.8 MCG/ACT Aero Generic drug: Budeson-Glycopyrrol-Formoterol Inhale 2 puffs into the lungs in the morning and at bedtime.   cetirizine 10 MG tablet Commonly known as: ZYRTEC Take 10 mg by mouth daily.   dextromethorphan-guaiFENesin 30-600 MG 12hr tablet Commonly known as: MUCINEX DM Take 1 tablet by mouth 2 (two) times daily.   diclofenac Sodium 1 % Gel Commonly known as: VOLTAREN Apply 2 g topically 4 (four) times daily.   dorzolamide 2 % ophthalmic solution Commonly known as: TRUSOPT Place 1 drop into the left eye 2 (two) times daily.   famotidine 20 MG tablet Commonly known as: PEPCID TAKE 1 TABLET (20 MG TOTAL) BY MOUTH AT BEDTIME.   feeding supplement (GLUCERNA SHAKE) Liqd Take 237 mLs by mouth 3 (three) times daily between meals.   fluticasone 50 MCG/ACT nasal spray Commonly known as: FLONASE Place 2 sprays into both nostrils 3 (three) times daily as needed for allergies.   hydrochlorothiazide 25 MG tablet Commonly known as: HYDRODIURIL Take 1 tablet (25 mg total) by mouth daily.   latanoprost 0.005 % ophthalmic solution Commonly known as: XALATAN Place 1 drop into both eyes at bedtime.   loxapine 10 MG capsule Commonly known as: LOXITANE Take 10 mg by mouth at bedtime.   megestrol 40 MG tablet Commonly known as: MEGACE Take 40 mg by mouth daily.   mirtazapine 30 MG tablet Commonly known as: REMERON Take 30 mg by mouth at bedtime.   multivitamin capsule Take 1 capsule by mouth daily.   OXYGEN Inhale 3-4 L into the lungs as directed. 3-4 liters with sleep and exertion as needed for low oxygen   pantoprazole  40 MG tablet Commonly known as: PROTONIX Take 40 mg by mouth daily.   polyethylene glycol 17 g packet Commonly known as: MIRALAX / GLYCOLAX TAKE 17 GRAMS BY MOUTH DAILY What changed: See the new instructions.   predniSONE 10 MG tablet Commonly known as: DELTASONE Take 50mg  daily for 3days,Take 40mg  daily for 3days,Take 30mg  daily for 3days,Take 20mg  daily for 3days,Take 10mg  daily for 3days, then stop What changed: See the new instructions.   risperiDONE 2 MG tablet Commonly known as: RISPERDAL Take 2 mg by mouth at bedtime.        Filed Weights   10/02/21 0319  Weight: 68.1 kg    DG Chest 2 View  Result Date: 10/01/2021 CLINICAL DATA:  Chest pain with cough for more than 1 week. History of COPD. EXAM: CHEST - 2 VIEW COMPARISON:  Radiographs 09/24/2021.  CT 05/12/2018. FINDINGS: The PA view was repeated. The heart size and mediastinal contours are stable. The lungs are hyperinflated but clear. No evidence of pleural effusion or pneumothorax. Old right-sided rib fracture noted. No acute osseous findings. IMPRESSION: Stable changes of chronic obstructive pulmonary disease. No acute cardiopulmonary process identified. Electronically Signed   By: Caryl Comes.D.  On: 10/01/2021 13:36   DG Chest 2 View  Result Date: 09/24/2021 CLINICAL DATA:  Chest pain EXAM: CHEST - 2 VIEW COMPARISON:  09/07/2021 FINDINGS: Background changes of emphysema with hyperinflation. No new consolidation or edema. Cardiomediastinal contours are stable. No acute osseous abnormality. IMPRESSION: No acute process in the chest Electronically Signed   By: Macy Mis M.D.   On: 09/24/2021 12:22   DG Ribs Unilateral Right  Result Date: 09/07/2021 CLINICAL DATA:  Choking episode EXAM: RIGHT RIBS - 2 VIEW COMPARISON:  Same day chest radiograph FINDINGS: There is a subacute, partially callused fracture of the posterior right sixth rib corresponding to nodular opacity seen on comparison chest radiograph. No  acute appearing fractures. Emphysema. IMPRESSION: There is a subacute, partially callused fracture of the posterior right sixth rib corresponding to nodular opacity seen on comparison chest radiograph. Electronically Signed   By: Delanna Ahmadi M.D.   On: 09/07/2021 11:46   DG Chest Portable 1 View  Result Date: 09/07/2021 CLINICAL DATA:  Shortness of breath EXAM: PORTABLE CHEST 1 VIEW COMPARISON:  06/03/2021 FINDINGS: Nodular density over the right mid lung measuring 2 cm. There is no edema, consolidation, effusion, or pneumothorax. Normal heart size and mediastinal contours. IMPRESSION: 1. Nodular density over the right chest favors subacute rib fracture and callus, but need confirmatory imaging with rib series or CT. 2. COPD. Electronically Signed   By: Jorje Guild M.D.   On: 09/07/2021 11:16   Results for orders placed or performed during the hospital encounter of 10/01/21  Resp Panel by RT-PCR (Flu A&B, Covid) Nasopharyngeal Swab     Status: None   Collection Time: 10/01/21  9:15 PM   Specimen: Nasopharyngeal Swab; Nasopharyngeal(NP) swabs in vial transport medium  Result Value Ref Range Status   SARS Coronavirus 2 by RT PCR NEGATIVE NEGATIVE Final    Comment: (NOTE) SARS-CoV-2 target nucleic acids are NOT DETECTED.  The SARS-CoV-2 RNA is generally detectable in upper respiratory specimens during the acute phase of infection. The lowest concentration of SARS-CoV-2 viral copies this assay can detect is 138 copies/mL. A negative result does not preclude SARS-Cov-2 infection and should not be used as the sole basis for treatment or other patient management decisions. A negative result may occur with  improper specimen collection/handling, submission of specimen other than nasopharyngeal swab, presence of viral mutation(s) within the areas targeted by this assay, and inadequate number of viral copies(<138 copies/mL). A negative result must be combined with clinical observations, patient  history, and epidemiological information. The expected result is Negative.  Fact Sheet for Patients:  EntrepreneurPulse.com.au  Fact Sheet for Healthcare Providers:  IncredibleEmployment.be  This test is no t yet approved or cleared by the [place] FDA and  has been authorized for detection and/or diagnosis of SARS-CoV-2 by FDA under an Emergency Use Authorization (EUA). This EUA will remain  in effect (meaning this test can be used) for the duration of the COVID-19 declaration under Section 564(b)(1) of the Act, 21 U.S.C.section 360bbb-3(b)(1), unless the authorization is terminated  or revoked sooner.       Influenza A by PCR NEGATIVE NEGATIVE Final   Influenza B by PCR NEGATIVE NEGATIVE Final    Comment: (NOTE) The Xpert Xpress SARS-CoV-2/FLU/RSV plus assay is intended as an aid in the diagnosis of influenza from Nasopharyngeal swab specimens and should not be used as a sole basis for treatment. Nasal washings and aspirates are unacceptable for Xpert Xpress SARS-CoV-2/FLU/RSV testing.  Fact Sheet for Patients: EntrepreneurPulse.com.au  Fact Sheet for Healthcare Providers: IncredibleEmployment.be  This test is not yet approved or cleared by the [place] FDA and has been authorized for detection and/or diagnosis of SARS-CoV-2 by FDA under an Emergency Use Authorization (EUA). This EUA will remain in effect (meaning this test can be used) for the duration of the COVID-19 declaration under Section 564(b)(1) of the Act, 21 U.S.C. section 360bbb-3(b)(1), unless the authorization is terminated or revoked.  Performed at [facility], [address], [place], [place] [address]   MRSA Next Gen by PCR, Nasal     Status: None   Collection Time: 10/02/21  3:04 AM   Specimen: Nasal Mucosa; Nasal Swab  Result Value Ref Range Status   MRSA by PCR Next Gen NOT DETECTED NOT DETECTED Final    Comment:  (NOTE) The GeneXpert MRSA Assay (FDA approved for NASAL specimens only), is one component of a comprehensive MRSA colonization surveillance program. It is not intended to diagnose MRSA infection nor to guide or monitor treatment for MRSA infections. Test performance is not FDA approved in patients less than 46 years old. Performed at [facility], [address], [place],  [address]    Recent Labs  Lab 10/01/21 1319 10/02/21 0417  WBC 12.2* 11.3*  11.5*  HGB 15.4 13.7  13.9  HCT 49.1 43.5  43.9  MCV 100.4* 100.0  100.0  PLT 249 231  227   Recent Labs  Lab 10/01/21 1319 10/02/21 0417 10/02/21 1250  NA 137 137 136  K 3.7 2.9* 3.6  CL 100 99 101  CO2 31 29 26   GLUCOSE 96 229* 136*  BUN 18 16 19   CREATININE 1.00 1.01  1.03 1.01  CALCIUM 9.0 8.6* 8.4*  MG  --  1.9  --   PHOS  --  1.7*  --    Recent Labs  Lab 10/02/21 0417  AST 27  ALT 27  ALKPHOS 60  BILITOT 0.5  PROT 6.0*  ALBUMIN 3.2*   Recent Labs  Lab 10/02/21 1145  GLUCAP 137*    Author: Berle Mull [facility]

## 2021-10-16 ENCOUNTER — Emergency Department (HOSPITAL_COMMUNITY)
Admission: EM | Admit: 2021-10-16 | Discharge: 2021-10-17 | Disposition: A | Discharge status: Home / Self Care | Payer: Medicaid Other | Attending: Emergency Medicine | Admitting: Emergency Medicine

## 2021-10-16 ENCOUNTER — Other Ambulatory Visit: Payer: Self-pay

## 2021-10-16 ENCOUNTER — Emergency Department (HOSPITAL_COMMUNITY): Payer: Medicaid Other

## 2021-10-16 ENCOUNTER — Encounter (HOSPITAL_COMMUNITY): Payer: Self-pay | Admitting: Emergency Medicine

## 2021-10-16 DIAGNOSIS — F17210 Nicotine dependence, cigarettes, uncomplicated: Secondary | ICD-10-CM | POA: Insufficient documentation

## 2021-10-16 DIAGNOSIS — Z20822 Contact with and (suspected) exposure to covid-19: Secondary | ICD-10-CM | POA: Insufficient documentation

## 2021-10-16 DIAGNOSIS — I10 Essential (primary) hypertension: Secondary | ICD-10-CM | POA: Diagnosis not present

## 2021-10-16 DIAGNOSIS — J441 Chronic obstructive pulmonary disease with (acute) exacerbation: Secondary | ICD-10-CM | POA: Insufficient documentation

## 2021-10-16 DIAGNOSIS — Z86010 Personal history of colonic polyps: Secondary | ICD-10-CM | POA: Diagnosis not present

## 2021-10-16 DIAGNOSIS — R0602 Shortness of breath: Secondary | ICD-10-CM | POA: Diagnosis present

## 2021-10-16 NOTE — ED Triage Notes (Signed)
Pt c/o sob, chest tightness, and abd swelling.

## 2021-10-16 NOTE — ED Provider Notes (Signed)
[facility] EMERGENCY DEPARTMENT Provider Note   CSN: [id number] Arrival date & time: 10/16/21  2331     History Chief Complaint  Patient presents with   Shortness of Breath    Tony Sullivan is a 60 y.o. male.  Patient presents to the emergency department for evaluation of abdominal pain and shortness of breath.  Patient reports that he has been having symptoms for 3 days.  Patient reports abdominal distention, right-sided pain and sensation of feeling constipation.  He took a laxative and did have a bowel movement which did not relieve his pain.  Patient reports that he has been experiencing progressively worsening shortness of breath over this time.  His chest is tight with breathing.  He does have a history of COPD.  He is on oxygen at home.      Past Medical History:  Diagnosis Date   Anxiety    Arthritis    COPD (chronic obstructive pulmonary disease) ([place]) 2004   diagnosed in 2004, no PFT's to date.  Started on home O2 12/2013, after found to be desatting at PCP's office, and referred to pulmonology.   Depression    GERD (gastroesophageal reflux disease)    High blood pressure    Prediabetes    Seasonal allergies    Tobacco dependence     Patient Active Problem List   Diagnosis Date Noted   Acute exacerbation of chronic obstructive pulmonary disease (COPD) ([name]) 10/02/2021   Hypophosphatemia 10/02/2021   Hypoalbuminemia due to protein-calorie malnutrition ([place]) 10/02/2021   Physical deconditioning 07/26/2021   Numbness and tingling in left arm 05/06/2021   Leukocytosis 05/05/2021   Severe headache 05/05/2021   Mixed hyperlipidemia 05/24/2020   Hypokalemia 05/24/2020   Chest pain 05/23/2020   Bipolar disorder ([place]) 05/23/2020   HNP (herniated nucleus pulposus), cervical 09/06/2019   Abdominal pain    Poor dentition 11/04/2017   Acute on chronic respiratory failure with hypoxia ([place]) 08/29/2017   ETOH abuse 03/30/2017   Cigarette smoker 03/30/2017   Acute  respiratory failure with hypoxia and hypercapnia ([facility]) 03/29/2017   Special screening for malignant neoplasms, colon    Benign neoplasm of ascending colon    Benign neoplasm of descending colon    Hoarseness 08/22/2016   GERD (gastroesophageal reflux disease) 08/22/2016   Atypical chest pain 12/05/2015   Essential hypertension 06/15/2014   Pectoralis muscle strain 01/11/2014   COPD with acute exacerbation ([place]) 01/10/2014   COPD  GOLD III 01/08/2014   Smoker 01/08/2014   Cough 01/08/2014   Chronic respiratory failure with hypoxia ([place]) 01/08/2014    Past Surgical History:  Procedure Laterality Date   ANTERIOR CERVICAL DECOMP/DISCECTOMY FUSION N/A 09/06/2019   Procedure: Anterior Cervical Discecectomy Fusion - Cerivcal Five-Cervical Six;  Surgeon: Kary Kos, MD;  Location: [facility];  Service: Neurosurgery;  Laterality: N/A;  Anterior Cervical Discecectomy Fusion - Cerivcal Five-Cervical Six   ANTERIOR CERVICAL DISCECTOMY  09/06/2019   APPENDECTOMY  1980   COLONOSCOPY WITH PROPOFOL N/A 12/10/2016   Procedure: COLONOSCOPY WITH PROPOFOL;  Surgeon: [name], MD;  Location: [facility];  Service: Endoscopy;  Laterality: N/A;   HIP SURGERY Right    SHOULDER ARTHROSCOPY Right        Family History  Problem Relation Age of Onset   Emphysema Mother    Allergies Mother        "everyone in family"   Asthma Mother        "everyone in family"   Heart disease Mother  Clotting disorder Mother    Cancer Mother    Emphysema Father    Allergies Father    Allergies Son    Seizures Brother    Diabetes Brother     Social History   Tobacco Use   Smoking status: Some Days    Packs/day: 0.00    Years: 36.00    Pack years: 0.00    Types: Cigarettes   Smokeless tobacco: Never   Tobacco comments:    smokes 2 cigarettes per day 11/06/2020  Vaping Use   Vaping Use: Never used  Substance Use Topics   Alcohol use: Not Currently    Alcohol/week: 14.0 - 21.0 standard drinks    Types:  14 - 21 Standard drinks or equivalent per week    Comment: 2-3 beers/day, with occasional "binges".  No history of withdrawal   Drug use: Not Currently    Types: Marijuana, Cocaine    Comment: last cocaine use last month    Home Medications Prior to Admission medications   Medication Sig Start Date End Date Taking? Authorizing Provider  albuterol (PROVENTIL) (2.5 MG/3ML) 0.083% nebulizer solution Take 3 mLs (2.5 mg total) by nebulization every 4 (four) hours as needed for wheezing or shortness of breath. 10/02/21 10/02/22  [name], MD  albuterol (VENTOLIN HFA) 108 (90 Base) MCG/ACT inhaler Inhale 2 puffs into the lungs every 6 (six) hours as needed for wheezing or shortness of breath. 09/24/21   Fransico Meadow, PA-C  atorvastatin (LIPITOR) 40 MG tablet TAKE ONE TABLET BY MOUTH DAILY Patient taking differently: Take 40 mg by mouth daily. 07/06/20   [name], MD  azithromycin (ZITHROMAX Z-PAK) 250 MG tablet Take 1 tablet (250 mg total) by mouth daily. Take one tablet a day beginning 11/29 09/24/21   Fransico Meadow, PA-C  benztropine (COGENTIN) 0.5 MG tablet Take 0.5 mg by mouth at bedtime.     [provider]  Budeson-Glycopyrrol-Formoterol (BREZTRI AEROSPHERE) 160-9-4.8 MCG/ACT AERO Inhale 2 puffs into the lungs in the morning and at bedtime. 07/25/21   [name], NP  Budeson-Glycopyrrol-Formoterol (BREZTRI AEROSPHERE) 160-9-4.8 MCG/ACT AERO Inhale 2 puffs into the lungs in the morning and at bedtime. Patient not taking: Reported on 09/07/2021 07/25/21   [name], NP  cetirizine (ZYRTEC) 10 MG tablet Take 10 mg by mouth daily.    [provider]  dextromethorphan-guaiFENesin (MUCINEX DM) 30-600 MG 12hr tablet Take 1 tablet by mouth 2 (two) times daily. 10/02/21   [name], MD  diclofenac Sodium (VOLTAREN) 1 % GEL Apply 2 g topically 4 (four) times daily.    [provider]  dorzolamide (TRUSOPT) 2 % ophthalmic solution Place 1 drop  into the left eye 2 (two) times daily. 12/12/20   [provider]  famotidine (PEPCID) 20 MG tablet TAKE 1 TABLET (20 MG TOTAL) BY MOUTH AT BEDTIME. 02/05/21   [name], MD  feeding supplement, GLUCERNA SHAKE, (GLUCERNA SHAKE) LIQD Take 237 mLs by mouth 3 (three) times daily between meals. 10/02/21   [name], MD  fluticasone (FLONASE) 50 MCG/ACT nasal spray Place 2 sprays into both nostrils 3 (three) times daily as needed for allergies.     [provider]  hydrochlorothiazide (HYDRODIURIL) 25 MG tablet Take 1 tablet (25 mg total) by mouth daily. 09/12/21   Tolia, Sunit, DO  latanoprost (XALATAN) 0.005 % ophthalmic solution Place 1 drop into both eyes at bedtime. 05/24/19   [provider]  loxapine Gust Rung)  10 MG capsule Take 10 mg by mouth at bedtime.     [provider]  megestrol (MEGACE) 40 MG tablet Take 40 mg by mouth daily.    [provider]  mirtazapine (REMERON) 30 MG tablet Take 30 mg by mouth at bedtime.    [provider]  Multiple Vitamin (MULTIVITAMIN) capsule Take 1 capsule by mouth daily.    [provider]  OXYGEN Inhale 3-4 L into the lungs as directed. 3-4 liters with sleep and exertion as needed for low oxygen    [provider]  pantoprazole (PROTONIX) 40 MG tablet Take 40 mg by mouth daily.    [provider]  polyethylene glycol (MIRALAX / GLYCOLAX) 17 g packet TAKE 17 GRAMS BY MOUTH DAILY Patient taking differently: Take 17 g by mouth daily as needed for mild constipation. 03/09/20   [name], MD  predniSONE (DELTASONE) 10 MG tablet TAKE ONE TO TWO TABLETS BY MOUTH DAILY AS DIRECTED 10/05/21   [name], MD  predniSONE (DELTASONE) 10 MG tablet Take 50mg  daily for 3days,Take 40mg  daily for 3days,Take 30mg  daily for 3days,Take 20mg  daily for 3days,Take 10mg  daily for 3days, then stop 10/02/21   [name], MD  risperiDONE (RISPERDAL) 2 MG tablet Take 2 mg by mouth at  bedtime.    [provider]    Allergies    Penicillins and Levocetirizine  Review of Systems   Review of Systems  Respiratory:  Positive for chest tightness and shortness of breath.   Gastrointestinal:  Positive for abdominal distention and abdominal pain.  All other systems reviewed and are negative.  Physical Exam Updated Vital Signs BP (!) 134/94    Pulse 78    Temp 98.5 F (36.9 C) (Oral)    Resp 19    Ht 5\' 11"  (1.803 m)    Wt 68.1 kg    SpO2 97%    BMI 20.94 kg/m   Physical Exam Vitals and nursing note reviewed.  Constitutional:      General: He is not in acute distress.    Appearance: Normal appearance. He is well-developed.  HENT:     Head: Normocephalic and atraumatic.     Right Ear: Hearing normal.     Left Ear: Hearing normal.     Nose: Nose normal.  Eyes:     Conjunctiva/sclera: Conjunctivae normal.     Pupils: Pupils are equal, round, and reactive to light.  Cardiovascular:     Rate and Rhythm: Regular rhythm. Tachycardia present.     Heart sounds: S1 normal and S2 normal. No murmur heard.   No friction rub. No gallop.  Pulmonary:     Effort: Tachypnea, accessory muscle usage and prolonged expiration present. No respiratory distress.     Breath sounds: Decreased air movement present.  Chest:     Chest wall: No tenderness.  Abdominal:     General: Bowel sounds are normal. There is distension.     Palpations: Abdomen is soft.     Tenderness: There is generalized abdominal tenderness. There is no guarding or rebound. Negative signs include Murphy's sign and McBurney's sign.     Hernia: No hernia is present.  Musculoskeletal:        General: Normal range of motion.     Cervical back: Normal range of motion and neck supple.  Skin:    General: Skin is warm and dry.     Findings: No rash.  Neurological:     Mental Status:  He is alert and oriented to person, place, and time.     GCS: GCS eye subscore is 4. GCS verbal subscore is 5. GCS motor  subscore is 6.     Cranial Nerves: No cranial nerve deficit.     Sensory: No sensory deficit.     Coordination: Coordination normal.  Psychiatric:        Speech: Speech normal.        Behavior: Behavior normal.        Thought Content: Thought content normal.    ED Results / Procedures / Treatments   Labs (all labs ordered are listed, but only abnormal results are displayed) Labs Reviewed  COMPREHENSIVE METABOLIC PANEL - Abnormal; Notable for the following components:      Result Value   Glucose, Bld 112 (*)    All other components within normal limits  LACTIC ACID, PLASMA - Abnormal; Notable for the following components:   Lactic Acid, Venous 2.6 (*)    All other components within normal limits  URINALYSIS, ROUTINE W REFLEX MICROSCOPIC - Abnormal; Notable for the following components:   Hgb urine dipstick TRACE (*)    All other components within normal limits  BRAIN NATRIURETIC PEPTIDE - Abnormal; Notable for the following components:   B Natriuretic Peptide 112.0 (*)    All other components within normal limits  CBC WITH DIFFERENTIAL/PLATELET - Abnormal; Notable for the following components:   WBC 23.0 (*)    Neutro Abs 20.2 (*)    Monocytes Absolute 1.2 (*)    Abs Immature Granulocytes 0.16 (*)    All other components within normal limits  URINALYSIS, MICROSCOPIC (REFLEX) - Abnormal; Notable for the following components:   Bacteria, UA FEW (*)    All other components within normal limits  RESP PANEL BY RT-PCR (FLU A&B, COVID) ARPGX2  LIPASE, BLOOD  CBC WITH DIFFERENTIAL/PLATELET  TROPONIN I (HIGH SENSITIVITY)  TROPONIN I (HIGH SENSITIVITY)    EKG EKG Interpretation  Date/Time:  Wednesday October 17 2021 00:28:18 EST Ventricular Rate:  87 PR Interval:  152 QRS Duration: 82 QT Interval:  371 QTC Calculation: 447 R Axis:   270 Text Interpretation: Sinus rhythm Biatrial enlargement Left anterior fascicular block Abnormal R-wave progression, late transition  Confirmed by Orpah Greek [phone]) on 10/17/2021 1:42:02 AM  Radiology DG Chest Port 1 View  Result Date: 10/17/2021 CLINICAL DATA:  Shortness of breath and chest tightness. EXAM: PORTABLE CHEST 1 VIEW COMPARISON:  October 01, 2021 FINDINGS: The lungs are hyperinflated. Diffuse, chronic appearing increased interstitial lung markings are noted. There is no evidence of acute infiltrate or pneumothorax. Mild, stable blunting of the left costophrenic angle is seen. The heart size and mediastinal contours are within normal limits. The visualized skeletal structures are unremarkable. IMPRESSION: 1. Findings consistent with COPD. 2. No acute or active cardiopulmonary disease. Electronically Signed   By: Virgina Norfolk M.D.   On: 10/17/2021 00:16   CT ANGIO CHEST/ABD/PEL FOR DISSECTION W &/OR WO CONTRAST  Result Date: 10/17/2021 CLINICAL DATA:  Acute aortic syndrome (AAS) suspected. Dyspnea, chest pain, abdominal distension EXAM: CT ANGIOGRAPHY CHEST, ABDOMEN AND PELVIS TECHNIQUE: Non-contrast CT of the chest was initially obtained. Multidetector CT imaging through the chest, abdomen and pelvis was performed using the standard protocol during bolus administration of intravenous contrast. Multiplanar reconstructed images and MIPs were obtained and reviewed to evaluate the vascular anatomy. CONTRAST:  174mL OMNIPAQUE IOHEXOL 350 MG/ML SOLN COMPARISON:  None. FINDINGS: CTA CHEST FINDINGS Cardiovascular: Mild coronary artery calcification. Global  cardiac size within normal limits. No pericardial effusion. Central pulmonary arteries are enlarged in keeping with changes of pulmonary arterial hypertension. Mild atherosclerotic calcification within the thoracic aorta. No aortic aneurysm. No intramural hematoma or dissection. Mediastinum/Nodes: No enlarged mediastinal, hilar, or axillary lymph nodes. Thyroid gland, trachea, and esophagus demonstrate no significant findings. Lungs/Pleura: Severe emphysema. No  superimposed focal pulmonary infiltrate. No pneumothorax or pleural effusion. Central airways are widely patent. Mild bronchial wall thickening present in keeping with airway inflammation. Musculoskeletal: Remote right sixth rib fracture. No acute bone abnormality. Mild T10 and T11 compression deformities with approximately 10-20% loss of height. No lytic or blastic bone lesion. Review of the MIP images confirms the above findings. CTA ABDOMEN AND PELVIS FINDINGS VASCULAR Aorta: Mild atherosclerotic calcification. No aneurysm or dissection. No periaortic inflammatory change. Celiac: No hemodynamically significant stenosis, aneurysm, or dissection. Variant anatomy with early takeoff of the right hepatic artery. SMA: Patent without evidence of aneurysm, dissection, vasculitis or significant stenosis. Renals: Both renal arteries are patent without evidence of aneurysm, dissection, vasculitis, fibromuscular dysplasia or significant stenosis. IMA: Patent without evidence of aneurysm, dissection, vasculitis or significant stenosis. Inflow: Patent without evidence of aneurysm, dissection, vasculitis or significant stenosis. Veins: No obvious venous abnormality within the limitations of this arterial phase study. Review of the MIP images confirms the above findings. NON-VASCULAR Hepatobiliary: No focal liver abnormality is seen. No gallstones, gallbladder wall thickening, or biliary dilatation. Pancreas: Pancreatic divisum. The pancreas is otherwise unremarkable. Spleen: Unremarkable Adrenals/Urinary Tract: 2.1 cm right adrenal adenoma. Left adrenal gland is unremarkable. Kidneys are unremarkable. Bladder is unremarkable. Stomach/Bowel: Stomach is within normal limits. Appendix appears normal. No evidence of bowel wall thickening, distention, or inflammatory changes. Lymphatic: No pathologic adenopathy within the abdomen and pelvis. Reproductive: Prostate is unremarkable. Other: No abdominal wall hernia or abnormality. No  abdominopelvic ascites. Musculoskeletal: No acute bone abnormality. Remote compression deformities of L1 and L3 are identified as well as inferior endplate fracture of L2. No retropulsion. No lytic or blastic bone lesion. Review of the MIP images confirms the above findings. IMPRESSION: No thoracoabdominal aortic aneurysm or dissection. No acute intrathoracic or intra-abdominal pathology identified. Mild coronary artery calcification. Severe emphysema. Complete pancreatic divisum. No superimposed peripancreatic inflammatory change. 2.1 cm right adrenal adenoma. Osteopenia with multiple remote thoracolumbar compression deformities. No acute bone abnormality. Aortic Atherosclerosis (ICD10-I70.0) and Emphysema (ICD10-J43.9). Electronically Signed   By: Fidela Salisbury M.D.   On: 10/17/2021 02:01    Procedures Procedures   Medications Ordered in ED Medications  methylPREDNISolone sodium succinate (SOLU-MEDROL) 125 mg/2 mL injection 125 mg (has no administration in time range)  albuterol (PROVENTIL) (2.5 MG/3ML) 0.083% nebulizer solution 10 mg (has no administration in time range)  ipratropium (ATROVENT) nebulizer solution 0.5 mg (has no administration in time range)  morphine 4 MG/ML injection 4 mg (4 mg Intravenous Given 10/17/21 0029)  ondansetron (ZOFRAN) injection 4 mg (4 mg Intravenous Given 10/17/21 0029)  iohexol (OMNIPAQUE) 350 MG/ML injection 100 mL (100 mLs Intravenous Contrast Given 10/17/21 0121)    ED Course  I have reviewed the triage vital signs and the nursing notes.  Pertinent labs & imaging results that were available during my care of the patient were reviewed by me and considered in my medical decision making (see chart for details).    MDM Rules/Calculators/A&P                         Patient presents to the emergency department for evaluation  of abdominal pain and shortness of breath.  Patient does have history of COPD.  At arrival, his home O2 tank is empty.  He has not,  however, significantly hypoxic.  Patient was tachycardic and tachypneic initially.  This seems to have improved.  Patient found to have a leukocytosis and elevated lactic acid.  Chest x-ray did not show evidence of pneumonia.  Abdominal exam revealed distention and mild diffuse tenderness without guarding or peritonitis.  Patient had CT angiography of chest, abdomen and pelvis to further evaluate symptoms.  No lung pathology noted.  No evidence of intra-abdominal pathology including no evidence of ischemic bowel.  Aorta normal.  No SBO.  Treat for COPD exacerbation.  CRITICAL CARE Performed by: Orpah Greek   Total critical care time: 32 minutes  Critical care time was exclusive of separately billable procedures and treating other patients.  Critical care was necessary to treat or prevent imminent or life-threatening deterioration.  Critical care was time spent personally by me on the following activities: development of treatment plan with patient and/or surrogate as well as nursing, discussions with consultants, evaluation of patient's response to treatment, examination of patient, obtaining history from patient or surrogate, ordering and performing treatments and interventions, ordering and review of laboratory studies, ordering and review of radiographic studies, pulse oximetry and re-evaluation of patient's condition.      Final Clinical Impression(s) / ED Diagnoses Final diagnoses:  COPD exacerbation ([place])    Rx / DC Orders ED Discharge Orders     None        L[place]te Al, Gwenyth Allegra, MD 10/17/21 0210

## 2021-10-17 ENCOUNTER — Emergency Department (HOSPITAL_COMMUNITY): Payer: Medicaid Other

## 2021-10-17 ENCOUNTER — Telehealth: Payer: Self-pay | Admitting: Internal Medicine

## 2021-10-17 LAB — URINALYSIS, ROUTINE W REFLEX MICROSCOPIC
Bilirubin Urine: NEGATIVE
Glucose, UA: NEGATIVE mg/dL
Ketones, ur: NEGATIVE mg/dL
Leukocytes,Ua: NEGATIVE
Nitrite: NEGATIVE
Protein, ur: NEGATIVE mg/dL
Specific Gravity, Urine: 1.02 (ref 1.005–1.030)
pH: 6 (ref 5.0–8.0)

## 2021-10-17 LAB — CBC WITH DIFFERENTIAL/PLATELET
Abs Immature Granulocytes: 0.16 10*3/uL — ABNORMAL HIGH (ref 0.00–0.07)
Basophils Absolute: 0.1 10*3/uL (ref 0.0–0.1)
Basophils Relative: 0 %
Eosinophils Absolute: 0 10*3/uL (ref 0.0–0.5)
Eosinophils Relative: 0 %
HCT: 47.5 % (ref 39.0–52.0)
Hemoglobin: 15.1 g/dL (ref 13.0–17.0)
Immature Granulocytes: 1 %
Lymphocytes Relative: 6 %
Lymphs Abs: 1.5 10*3/uL (ref 0.7–4.0)
MCH: 31.8 pg (ref 26.0–34.0)
MCHC: 31.8 g/dL (ref 30.0–36.0)
MCV: 100 fL (ref 80.0–100.0)
Monocytes Absolute: 1.2 10*3/uL — ABNORMAL HIGH (ref 0.1–1.0)
Monocytes Relative: 5 %
Neutro Abs: 20.2 10*3/uL — ABNORMAL HIGH (ref 1.7–7.7)
Neutrophils Relative %: 88 %
Platelets: 253 10*3/uL (ref 150–400)
RBC: 4.75 MIL/uL (ref 4.22–5.81)
RDW: 14.3 % (ref 11.5–15.5)
WBC: 23 10*3/uL — ABNORMAL HIGH (ref 4.0–10.5)
nRBC: 0 % (ref 0.0–0.2)

## 2021-10-17 LAB — RESP PANEL BY RT-PCR (FLU A&B, COVID) ARPGX2
Influenza A by PCR: NEGATIVE
Influenza B by PCR: NEGATIVE
SARS Coronavirus 2 by RT PCR: NEGATIVE

## 2021-10-17 LAB — COMPREHENSIVE METABOLIC PANEL
ALT: 28 U/L (ref 0–44)
AST: 28 U/L (ref 15–41)
Albumin: 3.9 g/dL (ref 3.5–5.0)
Alkaline Phosphatase: 79 U/L (ref 38–126)
Anion gap: 9 (ref 5–15)
BUN: 15 mg/dL (ref 6–20)
CO2: 31 mmol/L (ref 22–32)
Calcium: 9.2 mg/dL (ref 8.9–10.3)
Chloride: 98 mmol/L (ref 98–111)
Creatinine, Ser: 1.01 mg/dL (ref 0.61–1.24)
GFR, Estimated: >60 mL/min (ref >60)
Glucose, Bld: 112 mg/dL — ABNORMAL HIGH (ref 70–99)
Potassium: 3.5 mmol/L (ref 3.5–5.1)
Sodium: 138 mmol/L (ref 135–145)
Total Bilirubin: 0.7 mg/dL (ref 0.3–1.2)
Total Protein: 7.8 g/dL (ref 6.5–8.1)

## 2021-10-17 LAB — LACTIC ACID, PLASMA: Lactic Acid, Venous: 2.6 mmol/L (ref 0.5–1.9)

## 2021-10-17 LAB — TROPONIN I (HIGH SENSITIVITY)
Troponin I (High Sensitivity): 13 ng/L (ref <18)
Troponin I (High Sensitivity): 14 ng/L (ref <18)

## 2021-10-17 LAB — LIPASE, BLOOD: Lipase: 29 U/L (ref 11–51)

## 2021-10-17 LAB — URINALYSIS, MICROSCOPIC (REFLEX)

## 2021-10-17 LAB — BRAIN NATRIURETIC PEPTIDE: B Natriuretic Peptide: 112 pg/mL — ABNORMAL HIGH (ref 0.0–100.0)

## 2021-10-17 IMAGING — CT CT ANGIO CHEST-ABD-PELV FOR DISSECTION W/ AND WO/W CM
2 of 7 series · 13 of 46 positions shown, 15 images · IV contrast (Omnipaque or Isovue)
Comparison: None.

CLINICAL DATA: Acute aortic syndrome (AAS) suspected. Dyspnea,
chest pain, abdominal distension

EXAM:
CT ANGIOGRAPHY CHEST, ABDOMEN AND PELVIS
TECHNIQUE: Non-contrast CT of the chest was initially obtained.

[Series 5: axial arterial · axial · arterial · 0.75mm/px · z∈[+880,+1400]mm · 10 of 203 slices shown, 12 images]
[im 15/203  soft-tissue]
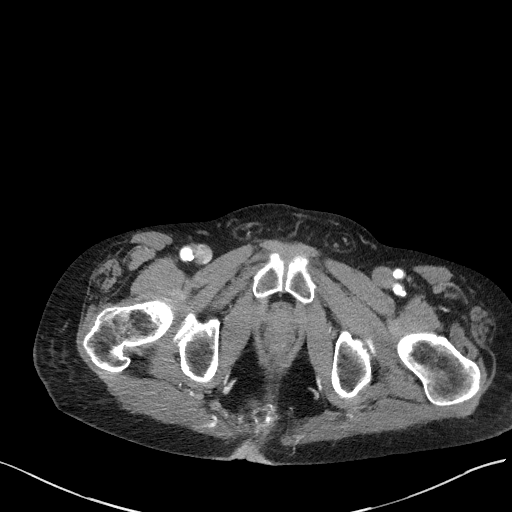
[im 15/203  bone]
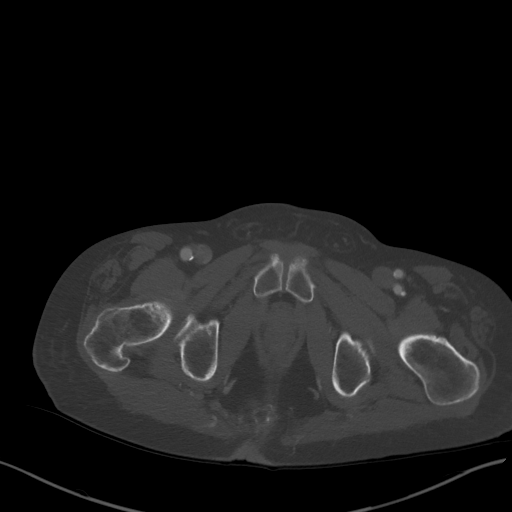
[im 29/203  soft-tissue]
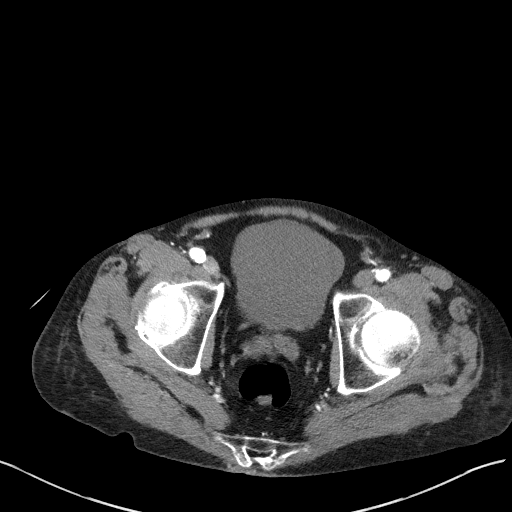
[im 58/203  soft-tissue]
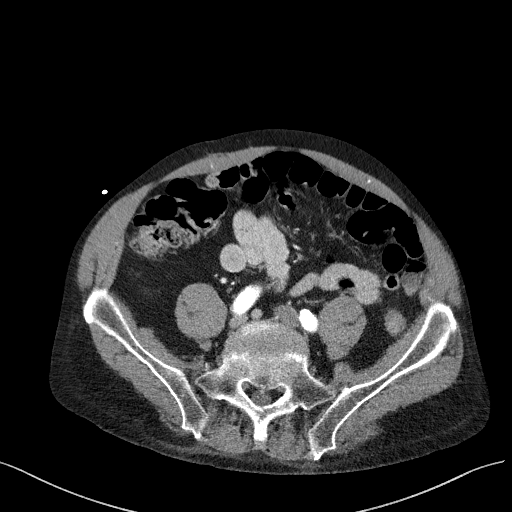
[im 73/203  soft-tissue]
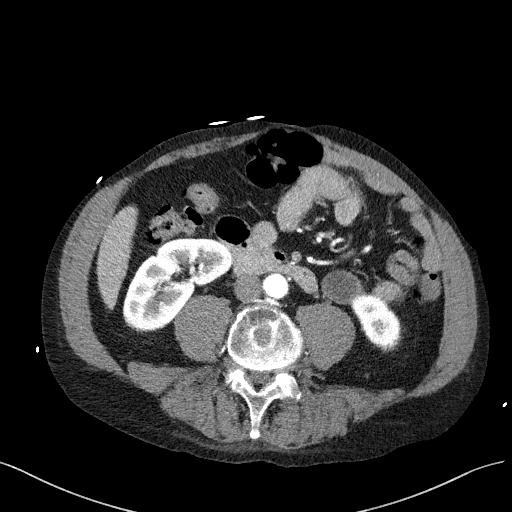
[im 87/203  soft-tissue]
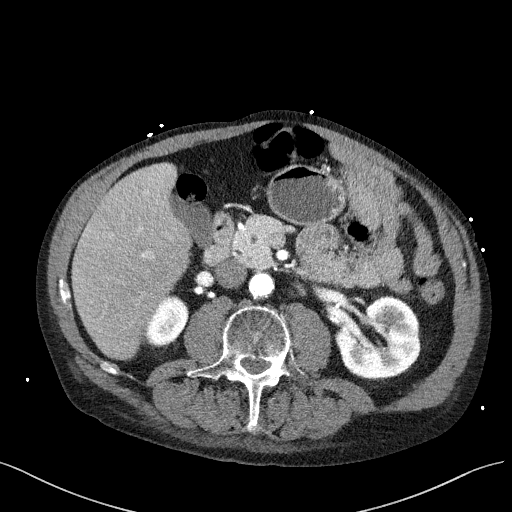
[im 116/203  soft-tissue]
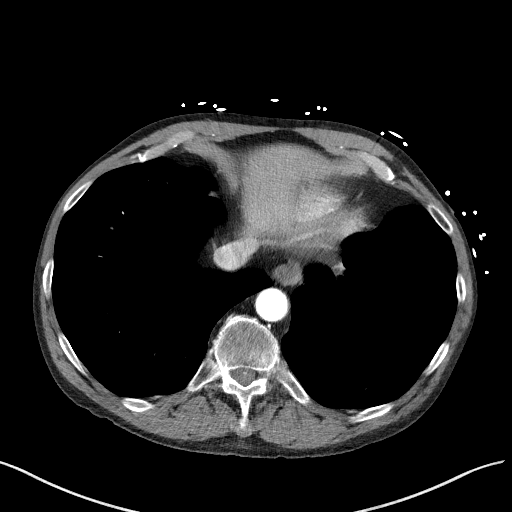
[im 130/203  soft-tissue]
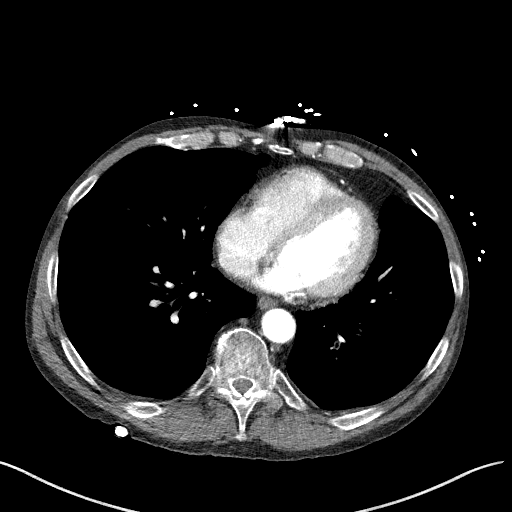
[im 145/203  soft-tissue]
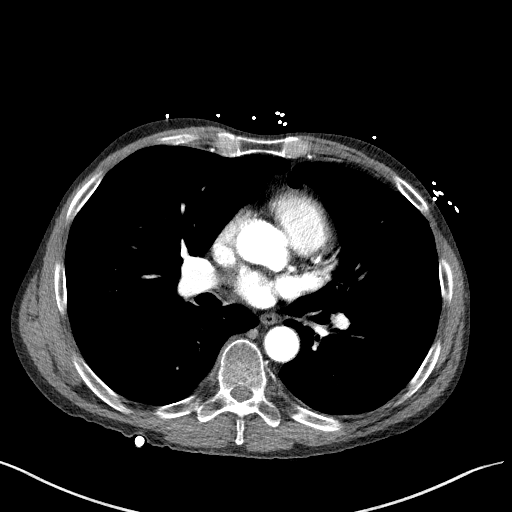
[im 174/203  soft-tissue]
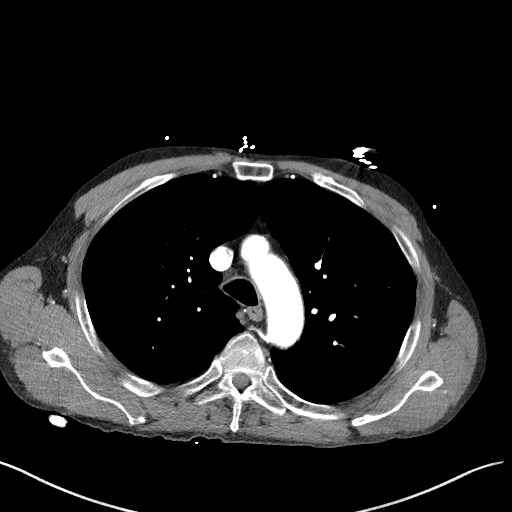
[im 174/203  bone]
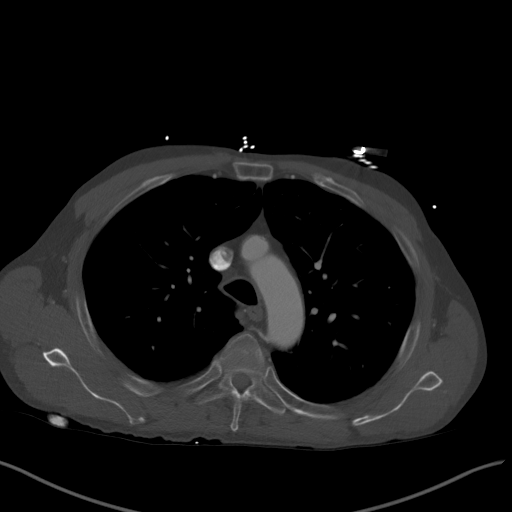
[im 188/203  soft-tissue]
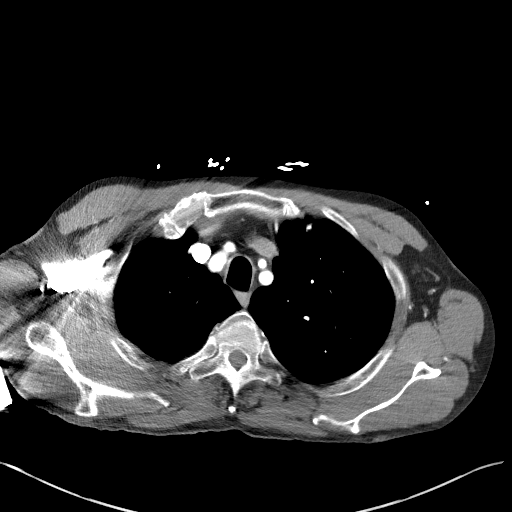

[Series 9: cor soft · coronal · 0.71mm/px · 3 of 131 slices shown]
[im 33/131  soft-tissue]
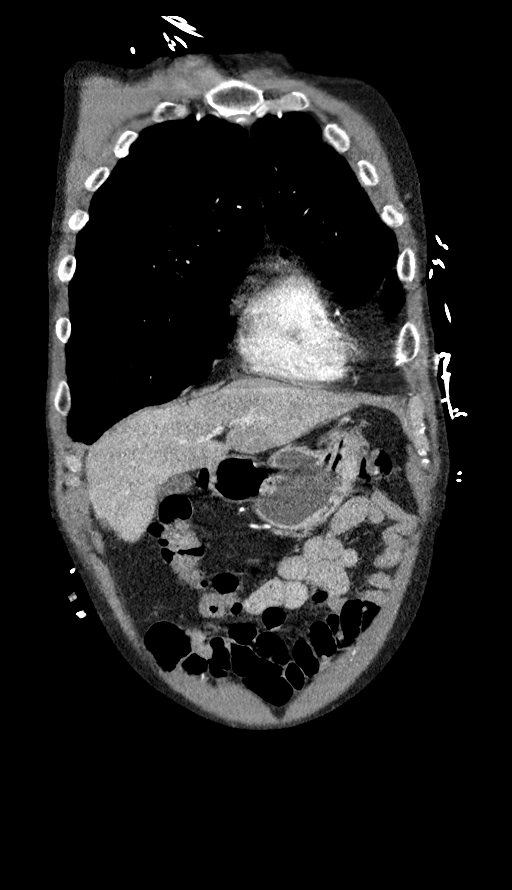
[im 66/131  soft-tissue]
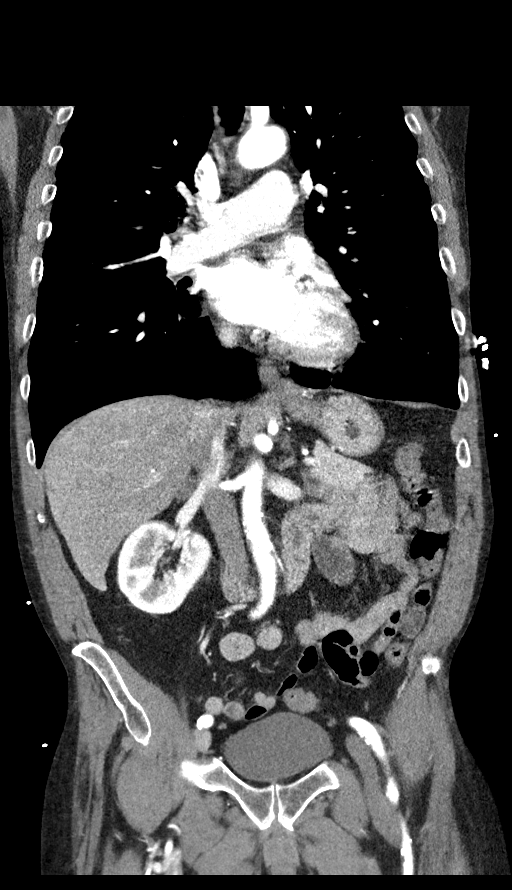
[im 98/131  soft-tissue]
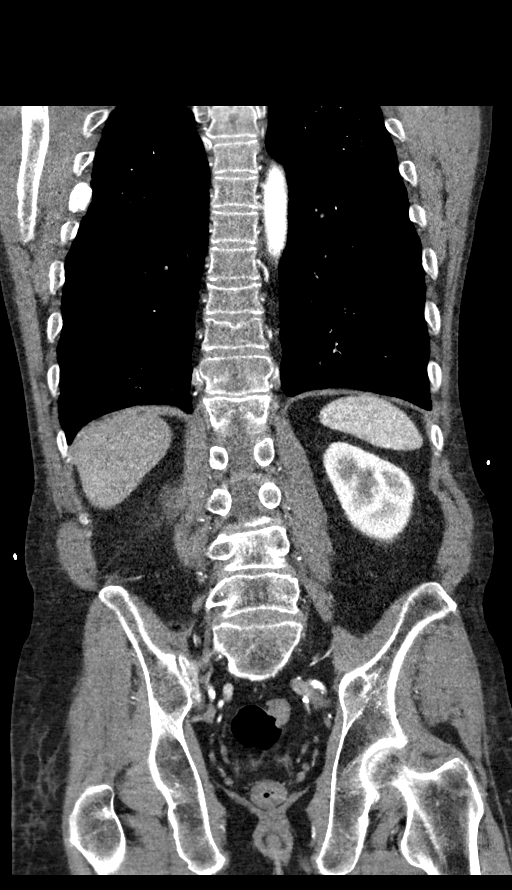

[13 of 46 positions shown; findings below may reference images not displayed]

Multidetector CT imaging through the chest, abdomen and pelvis was
performed using the standard protocol during bolus administration of
intravenous contrast. Multiplanar reconstructed images and MIPs were
obtained and reviewed to evaluate the vascular anatomy.

CONTRAST:  100mL OMNIPAQUE IOHEXOL 350 MG/ML SOLN
FINDINGS: CTA CHEST FINDINGS

Cardiovascular: Mild coronary artery calcification. Global cardiac
size within normal limits. No pericardial effusion. Central
pulmonary arteries are enlarged in keeping with changes of pulmonary
arterial hypertension. Mild atherosclerotic calcification within the
thoracic aorta. No aortic aneurysm. No intramural hematoma or
dissection.

Mediastinum/Nodes: No enlarged mediastinal, hilar, or axillary lymph
nodes. Thyroid gland, trachea, and esophagus demonstrate no
significant findings.

Lungs/Pleura: Severe emphysema. No superimposed focal pulmonary
infiltrate. No pneumothorax or pleural effusion. Central airways are
widely patent. Mild bronchial wall thickening present in keeping
with airway inflammation.

Musculoskeletal: Remote right sixth rib fracture. No acute bone
abnormality. Mild T10 and T11 compression deformities with
approximately 10-20% loss of height. No lytic or blastic bone
lesion.

Review of the MIP images confirms the above findings.

CTA ABDOMEN AND PELVIS FINDINGS

VASCULAR

Aorta: Mild atherosclerotic calcification. No aneurysm or
dissection. No periaortic inflammatory change.

Celiac: No hemodynamically significant stenosis, aneurysm, or
dissection. Variant anatomy with early takeoff of the right hepatic
artery.

SMA: Patent without evidence of aneurysm, dissection, vasculitis or
significant stenosis.

Renals: Both renal arteries are patent without evidence of aneurysm,
dissection, vasculitis, fibromuscular dysplasia or significant
stenosis.

IMA: Patent without evidence of aneurysm, dissection, vasculitis or
significant stenosis.

Inflow: Patent without evidence of aneurysm, dissection, vasculitis
or significant stenosis.

Veins: No obvious venous abnormality within the limitations of this
arterial phase study.

Review of the MIP images confirms the above findings.

NON-VASCULAR

Hepatobiliary: No focal liver abnormality is seen. No gallstones,
gallbladder wall thickening, or biliary dilatation.

Pancreas: Pancreatic divisum. The pancreas is otherwise
unremarkable.

Spleen: Unremarkable

Adrenals/Urinary Tract: 2.1 cm right adrenal adenoma. Left adrenal
gland is unremarkable. Kidneys are unremarkable. Bladder is
unremarkable.

Stomach/Bowel: Stomach is within normal limits. Appendix appears
normal. No evidence of bowel wall thickening, distention, or
inflammatory changes.

Lymphatic: No pathologic adenopathy within the abdomen and pelvis.

Reproductive: Prostate is unremarkable.

Other: No abdominal wall hernia or abnormality. No abdominopelvic
ascites.

Musculoskeletal: No acute bone abnormality. Remote compression
deformities of L1 and L3 are identified as well as inferior endplate
fracture of L2. No retropulsion. No lytic or blastic bone lesion.

Review of the MIP images confirms the above findings.
IMPRESSION: No thoracoabdominal aortic aneurysm or dissection.

No acute intrathoracic or intra-abdominal pathology identified.

Mild coronary artery calcification.

Severe emphysema.

Complete pancreatic divisum. No superimposed peripancreatic
inflammatory change.

2.1 cm right adrenal adenoma.

Osteopenia with multiple remote thoracolumbar compression
deformities. No acute bone abnormality.

Aortic Atherosclerosis ([QJ]-[QJ]) and Emphysema ([QJ]-[QJ]).

## 2021-10-17 IMAGING — DX DG CHEST 1V PORT
2 series · 2 of 2 positions shown · non-contrast
Comparison: [DATE]

CLINICAL DATA: Shortness of breath and chest tightness.

EXAM:
PORTABLE CHEST 1 VIEW

[chest ap (1 of 2)]
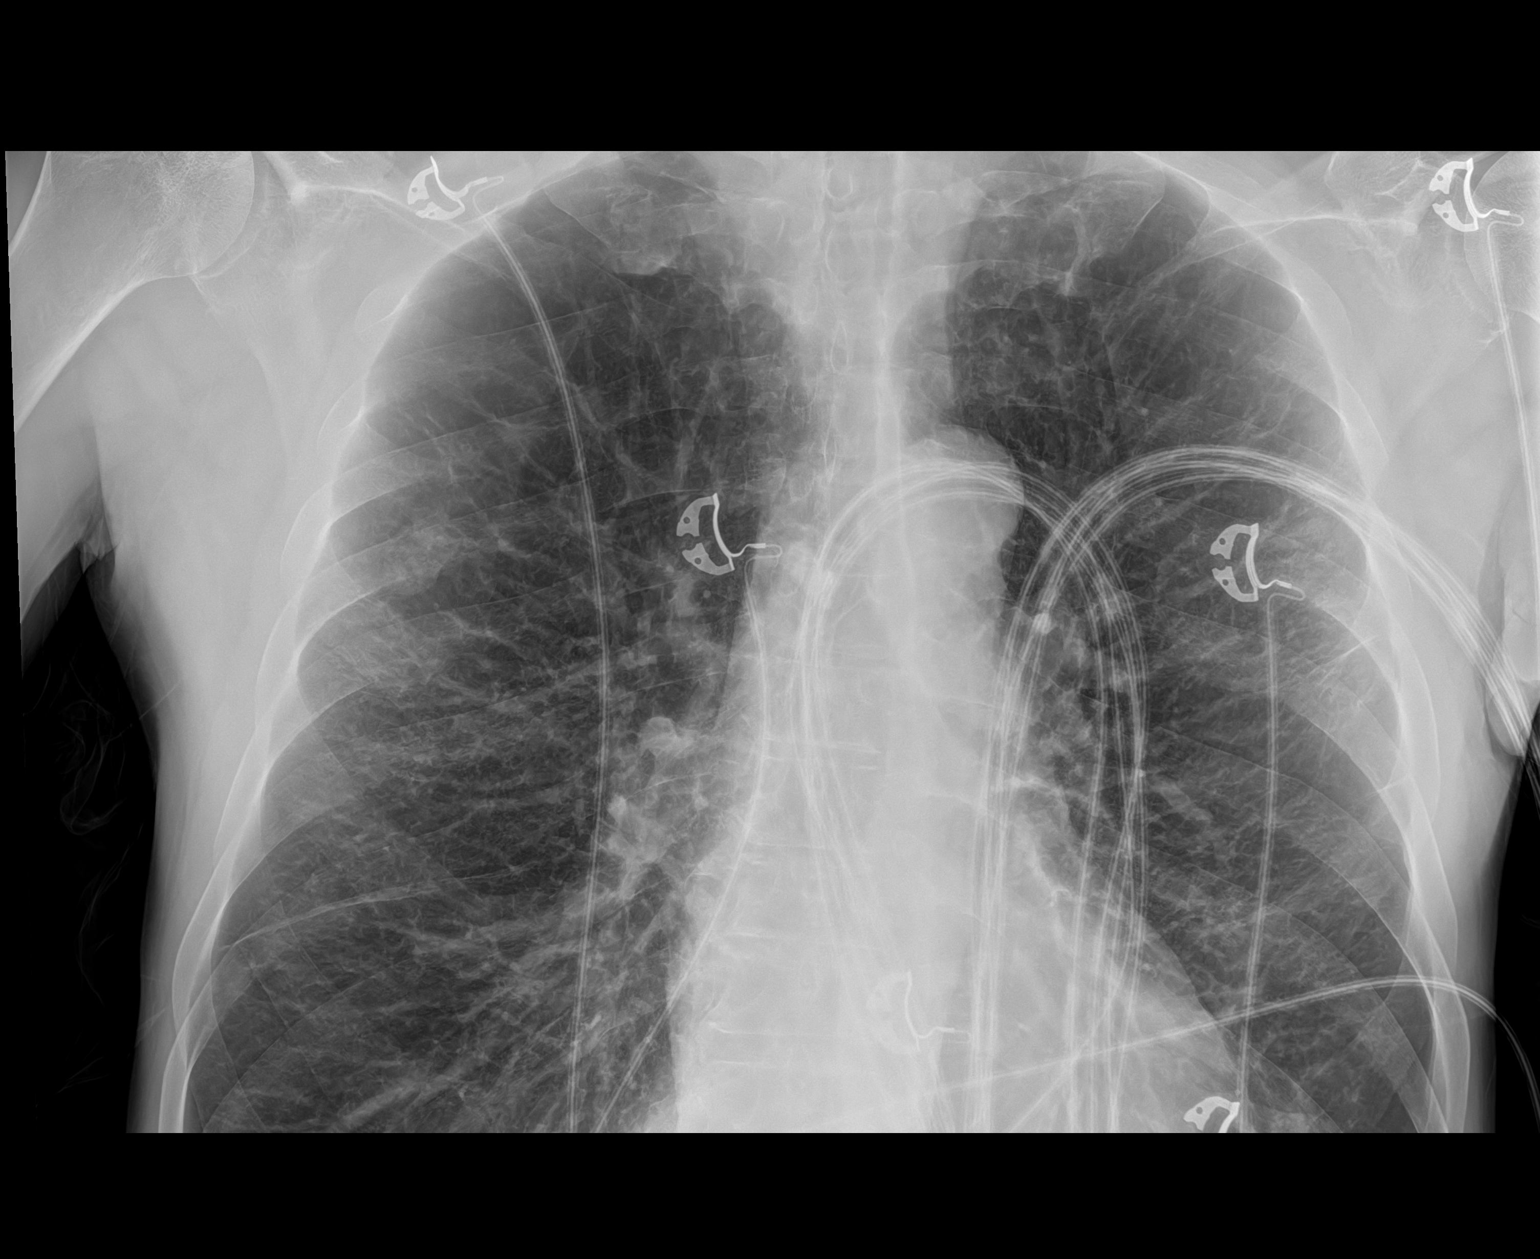

[chest ap (2 of 2)]
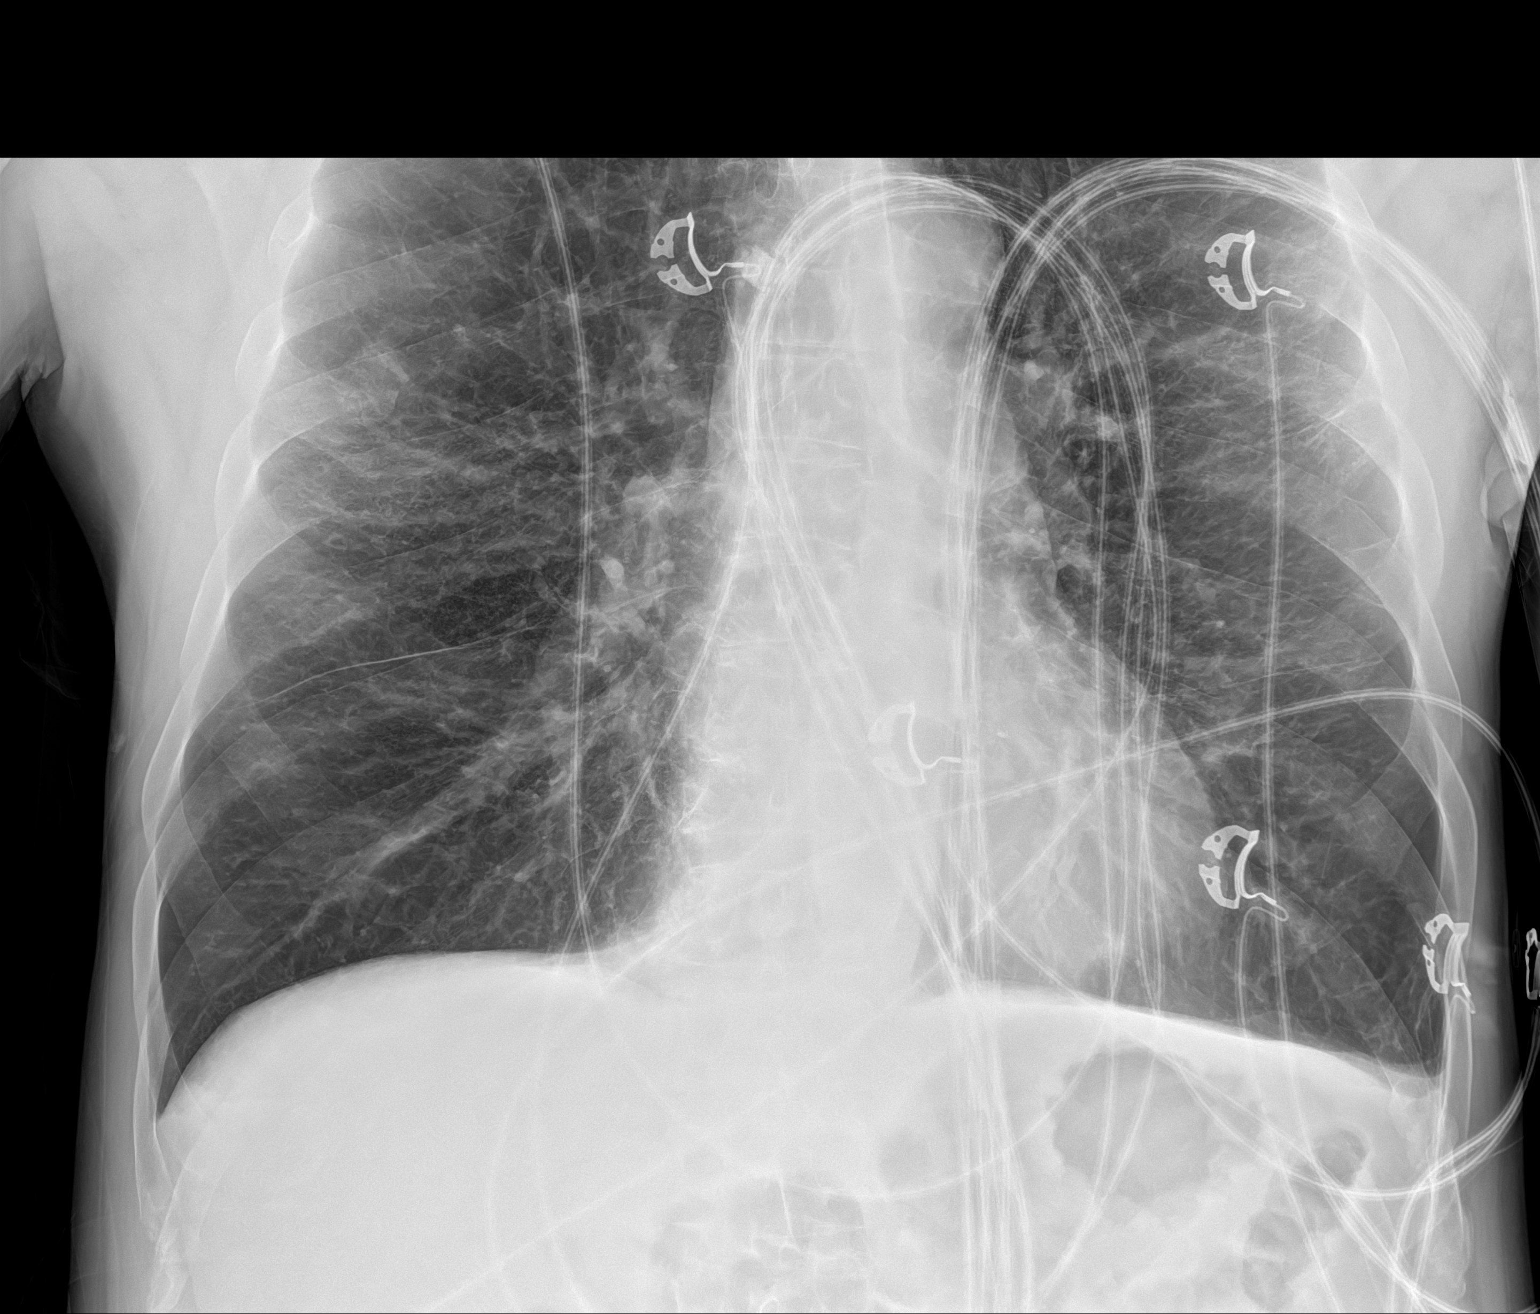

[2 of 2 positions shown; findings below may reference images not displayed]

FINDINGS: The lungs are hyperinflated. Diffuse, chronic appearing increased
interstitial lung markings are noted. There is no evidence of acute
infiltrate or pneumothorax. Mild, stable blunting of the left
costophrenic angle is seen. The heart size and mediastinal contours
are within normal limits. The visualized skeletal structures are
unremarkable.
IMPRESSION: 1. Findings consistent with COPD.
2. No acute or active cardiopulmonary disease.

## 2021-10-17 MED ORDER — OXYCODONE-ACETAMINOPHEN 5-325 MG PO TABS: 1.0000 | ORAL_TABLET | ORAL | 0 refills | Status: DC | PRN

## 2021-10-17 MED ORDER — OXYCODONE-ACETAMINOPHEN 5-325 MG PO TABS
1.0000 | ORAL_TABLET | Freq: Once | ORAL | Status: AC
  Administered 2021-10-17: 04:00:00 1 via ORAL
  Filled 2021-10-17: qty 1

## 2021-10-17 MED ORDER — IOHEXOL 350 MG/ML SOLN
100.0000 mL | Freq: Once | INTRAVENOUS | Status: AC | PRN
  Administered 2021-10-17: 01:00:00 100 mL via INTRAVENOUS

## 2021-10-17 MED ORDER — MORPHINE SULFATE (PF) 4 MG/ML IV SOLN
4.0000 mg | Freq: Once | INTRAVENOUS | Status: AC
  Administered 2021-10-17: 4 mg via INTRAVENOUS
  Filled 2021-10-17: qty 1

## 2021-10-17 MED ORDER — DICYCLOMINE HCL 10 MG PO CAPS
10.0000 mg | ORAL_CAPSULE | Freq: Once | ORAL | Status: AC
  Administered 2021-10-17: 04:00:00 10 mg via ORAL
  Filled 2021-10-17: qty 1

## 2021-10-17 MED ORDER — DICYCLOMINE HCL 20 MG PO TABS: 20.0000 mg | ORAL_TABLET | Freq: Three times a day (TID) | ORAL | 0 refills | Status: DC

## 2021-10-17 MED ORDER — ONDANSETRON HCL 4 MG/2ML IJ SOLN
4.0000 mg | Freq: Once | INTRAMUSCULAR | Status: AC
  Administered 2021-10-17: 4 mg via INTRAVENOUS
  Filled 2021-10-17: qty 2

## 2021-10-17 MED ORDER — ALBUTEROL SULFATE (2.5 MG/3ML) 0.083% IN NEBU
10.0000 mg | INHALATION_SOLUTION | Freq: Once | RESPIRATORY_TRACT | Status: AC
  Administered 2021-10-17: 03:00:00 10 mg via RESPIRATORY_TRACT
  Filled 2021-10-17: qty 12

## 2021-10-17 MED ORDER — IPRATROPIUM BROMIDE 0.02 % IN SOLN
0.5000 mg | Freq: Once | RESPIRATORY_TRACT | Status: AC
  Administered 2021-10-17: 03:00:00 0.5 mg via RESPIRATORY_TRACT
  Filled 2021-10-17: qty 2.5

## 2021-10-17 MED ORDER — METHYLPREDNISOLONE SODIUM SUCC 125 MG IJ SOLR
125.0000 mg | Freq: Once | INTRAMUSCULAR | Status: AC
  Administered 2021-10-17: 02:00:00 125 mg via INTRAVENOUS
  Filled 2021-10-17: qty 2

## 2021-10-17 MED ORDER — SIMETHICONE 40 MG/0.6ML PO SUSP: 40.0000 mg | Freq: Four times a day (QID) | ORAL | 0 refills | Status: DC | PRN

## 2021-10-17 MED ORDER — ALBUTEROL SULFATE HFA 108 (90 BASE) MCG/ACT IN AERS
2.0000 | INHALATION_SPRAY | RESPIRATORY_TRACT | Status: DC | PRN
  Administered 2021-10-17: 04:00:00 2 via RESPIRATORY_TRACT
  Filled 2021-10-17: qty 6.7

## 2021-10-17 NOTE — ED Notes (Signed)
Date and time results received: 10/17/21 12:54 AM  Test: Lactic Acid Critical Value: 2.6  Name of Provider Notified: Dr. Betsey Holiday  Orders Received? Or Actions Taken?:

## 2021-10-17 NOTE — ED Notes (Signed)
ED Provider at bedside. 

## 2021-10-17 NOTE — Telephone Encounter (Signed)
Spoke with the pt  He is c/o increased SOB, cough with clear sputum and chest tightness over the past wk  Denies fevers, aches  He states went to ED 10/16/21 and was tested for flu/covid- negative  He states that he feels this is a COPD flare, and that he needs a zpack  He states Dr [name] usually gives him rx to keep on hand if needed  I do not see where Dr [name] mentioned this under COPD overview in Epic  He is taking 20 mg pred, breztri, albuterol inhaler mucinex  Pt lives in [place] and relies on public transportation, needing any visit scheduled at least 3 days in advance  I offered video visit for tomorrow and he did not want to do this since he has in person ov with MW 10/24/21  Judson Roch, please advise thank you!  Allergies  Allergen Reactions   Penicillins Anaphylaxis    Has patient had a PCN reaction causing immediate rash, facial/tongue/throat swelling, SOB or lightheadedness with hypotension: Yes Has patient had a PCN reaction causing severe rash involving mucus membranes or skin necrosis: No Has patient had a PCN reaction that required hospitalization No Has patient had a PCN reaction occurring within the last 10 years: No If all of the above answers are "NO", then may proceed with Cephalosporin use.    Levocetirizine Other (See Comments)    Muscle cramps

## 2021-10-17 NOTE — Telephone Encounter (Signed)
Magdalen Spatz, NP to Me      9:58 AM  He needs at least a televisit for me to send in ABX, or if [name] is available he can send it in as he has seen him before.   MW on PAL. I spoke with the pt and scheduled him for video visit with [facility] tomorrow at 11 am.

## 2021-10-18 ENCOUNTER — Telehealth: Payer: Medicaid Other | Admitting: Primary Care

## 2021-10-18 ENCOUNTER — Telehealth: Payer: Self-pay

## 2021-10-18 ENCOUNTER — Other Ambulatory Visit: Payer: Self-pay

## 2021-10-18 MED FILL — Oxycodone w/ Acetaminophen Tab 5-325 MG: ORAL | Qty: 6 | Status: AC

## 2021-10-18 NOTE — Telephone Encounter (Signed)
Phone note 

## 2021-10-19 ENCOUNTER — Telehealth: Payer: Medicaid Other | Admitting: Primary Care

## 2021-10-24 ENCOUNTER — Ambulatory Visit: Payer: Medicaid Other | Admitting: Internal Medicine

## 2021-10-27 ENCOUNTER — Observation Stay (HOSPITAL_COMMUNITY): Payer: Medicaid Other

## 2021-10-27 ENCOUNTER — Encounter (HOSPITAL_COMMUNITY): Payer: Self-pay

## 2021-10-27 ENCOUNTER — Emergency Department (HOSPITAL_COMMUNITY): Payer: Medicaid Other

## 2021-10-27 ENCOUNTER — Inpatient Hospital Stay (HOSPITAL_COMMUNITY)
Admission: EM | Admit: 2021-10-27 | Discharge: 2021-10-30 | DRG: 190 | Disposition: A | Discharge status: Home / Self Care | Payer: Medicaid Other | Attending: Family Medicine | Admitting: Family Medicine

## 2021-10-27 ENCOUNTER — Other Ambulatory Visit: Payer: Self-pay

## 2021-10-27 DIAGNOSIS — K219 Gastro-esophageal reflux disease without esophagitis: Secondary | ICD-10-CM | POA: Diagnosis present

## 2021-10-27 DIAGNOSIS — Z7952 Long term (current) use of systemic steroids: Secondary | ICD-10-CM

## 2021-10-27 DIAGNOSIS — R0609 Other forms of dyspnea: Secondary | ICD-10-CM | POA: Diagnosis not present

## 2021-10-27 DIAGNOSIS — M1990 Unspecified osteoarthritis, unspecified site: Secondary | ICD-10-CM | POA: Diagnosis present

## 2021-10-27 DIAGNOSIS — F419 Anxiety disorder, unspecified: Secondary | ICD-10-CM | POA: Diagnosis present

## 2021-10-27 DIAGNOSIS — I248 Other forms of acute ischemic heart disease: Secondary | ICD-10-CM | POA: Diagnosis present

## 2021-10-27 DIAGNOSIS — D72829 Elevated white blood cell count, unspecified: Secondary | ICD-10-CM | POA: Diagnosis present

## 2021-10-27 DIAGNOSIS — Z833 Family history of diabetes mellitus: Secondary | ICD-10-CM | POA: Diagnosis not present

## 2021-10-27 DIAGNOSIS — R778 Other specified abnormalities of plasma proteins: Secondary | ICD-10-CM | POA: Diagnosis not present

## 2021-10-27 DIAGNOSIS — F17210 Nicotine dependence, cigarettes, uncomplicated: Secondary | ICD-10-CM | POA: Diagnosis present

## 2021-10-27 DIAGNOSIS — Z832 Family history of diseases of the blood and blood-forming organs and certain disorders involving the immune mechanism: Secondary | ICD-10-CM

## 2021-10-27 DIAGNOSIS — Z9981 Dependence on supplemental oxygen: Secondary | ICD-10-CM | POA: Diagnosis not present

## 2021-10-27 DIAGNOSIS — Z91199 Patient's noncompliance with other medical treatment and regimen due to unspecified reason: Secondary | ICD-10-CM

## 2021-10-27 DIAGNOSIS — R5381 Other malaise: Secondary | ICD-10-CM

## 2021-10-27 DIAGNOSIS — Z8249 Family history of ischemic heart disease and other diseases of the circulatory system: Secondary | ICD-10-CM | POA: Diagnosis not present

## 2021-10-27 DIAGNOSIS — Z825 Family history of asthma and other chronic lower respiratory diseases: Secondary | ICD-10-CM

## 2021-10-27 DIAGNOSIS — I119 Hypertensive heart disease without heart failure: Secondary | ICD-10-CM | POA: Diagnosis present

## 2021-10-27 DIAGNOSIS — E782 Mixed hyperlipidemia: Secondary | ICD-10-CM | POA: Diagnosis present

## 2021-10-27 DIAGNOSIS — J441 Chronic obstructive pulmonary disease with (acute) exacerbation: Principal | ICD-10-CM | POA: Diagnosis present

## 2021-10-27 DIAGNOSIS — R269 Unspecified abnormalities of gait and mobility: Secondary | ICD-10-CM | POA: Diagnosis present

## 2021-10-27 DIAGNOSIS — J9621 Acute and chronic respiratory failure with hypoxia: Secondary | ICD-10-CM | POA: Diagnosis present

## 2021-10-27 DIAGNOSIS — T380X5A Adverse effect of glucocorticoids and synthetic analogues, initial encounter: Secondary | ICD-10-CM | POA: Diagnosis present

## 2021-10-27 DIAGNOSIS — R739 Hyperglycemia, unspecified: Secondary | ICD-10-CM | POA: Diagnosis present

## 2021-10-27 DIAGNOSIS — R718 Other abnormality of red blood cells: Secondary | ICD-10-CM | POA: Diagnosis not present

## 2021-10-27 DIAGNOSIS — E8729 Other acidosis: Secondary | ICD-10-CM | POA: Diagnosis present

## 2021-10-27 DIAGNOSIS — J9691 Respiratory failure, unspecified with hypoxia: Secondary | ICD-10-CM | POA: Diagnosis present

## 2021-10-27 DIAGNOSIS — F1490 Cocaine use, unspecified, uncomplicated: Secondary | ICD-10-CM | POA: Diagnosis present

## 2021-10-27 DIAGNOSIS — F32A Depression, unspecified: Secondary | ICD-10-CM | POA: Diagnosis present

## 2021-10-27 DIAGNOSIS — Z888 Allergy status to other drugs, medicaments and biological substances status: Secondary | ICD-10-CM

## 2021-10-27 DIAGNOSIS — Z20822 Contact with and (suspected) exposure to covid-19: Secondary | ICD-10-CM | POA: Diagnosis present

## 2021-10-27 DIAGNOSIS — Z79899 Other long term (current) drug therapy: Secondary | ICD-10-CM

## 2021-10-27 DIAGNOSIS — Z880 Allergy status to penicillin: Secondary | ICD-10-CM | POA: Diagnosis not present

## 2021-10-27 DIAGNOSIS — J9602 Acute respiratory failure with hypercapnia: Secondary | ICD-10-CM | POA: Diagnosis present

## 2021-10-27 DIAGNOSIS — J9622 Acute and chronic respiratory failure with hypercapnia: Secondary | ICD-10-CM | POA: Diagnosis present

## 2021-10-27 DIAGNOSIS — Z981 Arthrodesis status: Secondary | ICD-10-CM

## 2021-10-27 LAB — CBC WITH DIFFERENTIAL/PLATELET
Abs Immature Granulocytes: 0.09 10*3/uL — ABNORMAL HIGH (ref 0.00–0.07)
Basophils Absolute: 0 10*3/uL (ref 0.0–0.1)
Basophils Relative: 0 %
Eosinophils Absolute: 0 10*3/uL (ref 0.0–0.5)
Eosinophils Relative: 0 %
HCT: 47.2 % (ref 39.0–52.0)
Hemoglobin: 15.3 g/dL (ref 13.0–17.0)
Immature Granulocytes: 1 %
Lymphocytes Relative: 11 %
Lymphs Abs: 1.4 10*3/uL (ref 0.7–4.0)
MCH: 32.6 pg (ref 26.0–34.0)
MCHC: 32.4 g/dL (ref 30.0–36.0)
MCV: 100.6 fL — ABNORMAL HIGH (ref 80.0–100.0)
Monocytes Absolute: 1 10*3/uL (ref 0.1–1.0)
Monocytes Relative: 8 %
Neutro Abs: 10 10*3/uL — ABNORMAL HIGH (ref 1.7–7.7)
Neutrophils Relative %: 80 %
Platelets: 249 10*3/uL (ref 150–400)
RBC: 4.69 MIL/uL (ref 4.22–5.81)
RDW: 14.7 % (ref 11.5–15.5)
WBC: 12.4 10*3/uL — ABNORMAL HIGH (ref 4.0–10.5)
nRBC: 0 % (ref 0.0–0.2)

## 2021-10-27 LAB — BLOOD GAS, ARTERIAL
Acid-Base Excess: 7.2 mmol/L — ABNORMAL HIGH (ref 0.0–2.0)
Acid-Base Excess: 8.5 mmol/L — ABNORMAL HIGH (ref 0.0–2.0)
Bicarbonate: 28.2 mmol/L — ABNORMAL HIGH (ref 20.0–28.0)
Bicarbonate: 30.1 mmol/L — ABNORMAL HIGH (ref 20.0–28.0)
Drawn by: 22223
Drawn by: 22223
FIO2: 45
FIO2: 45
O2 Saturation: 98.6 %
O2 Saturation: 97.9 %
Patient temperature: 37
Patient temperature: 37
pCO2 arterial: 78 mmHg (ref 32.0–48.0)
pCO2 arterial: 69.7 mmHg (ref 32.0–48.0)
pH, Arterial: 7.263 — ABNORMAL LOW (ref 7.350–7.450)
pH, Arterial: 7.317 — ABNORMAL LOW (ref 7.350–7.450)
pO2, Arterial: 277 mmHg — ABNORMAL HIGH (ref 83.0–108.0)
pO2, Arterial: 139 mmHg — ABNORMAL HIGH (ref 83.0–108.0)

## 2021-10-27 LAB — ECHOCARDIOGRAM COMPLETE
Area-P 1/2: 3.85 cm2
Calc EF: 56.5 %
Height: 71 in
S' Lateral: 3 cm
Single Plane A2C EF: 55.1 %
Single Plane A4C EF: 59.7 %
Weight: 2313.95 oz

## 2021-10-27 LAB — TROPONIN I (HIGH SENSITIVITY)
Troponin I (High Sensitivity): 20 ng/L — ABNORMAL HIGH (ref <18)
Troponin I (High Sensitivity): 48 ng/L — ABNORMAL HIGH (ref <18)
Troponin I (High Sensitivity): 100 ng/L (ref <18)
Troponin I (High Sensitivity): 92 ng/L — ABNORMAL HIGH (ref <18)
Troponin I (High Sensitivity): 99 ng/L — ABNORMAL HIGH (ref <18)

## 2021-10-27 LAB — VITAMIN B12: Vitamin B-12: 733 pg/mL (ref 180–914)

## 2021-10-27 LAB — BASIC METABOLIC PANEL
Anion gap: 11 (ref 5–15)
BUN: 21 mg/dL — ABNORMAL HIGH (ref 6–20)
CO2: 33 mmol/L — ABNORMAL HIGH (ref 22–32)
Calcium: 9 mg/dL (ref 8.9–10.3)
Chloride: 95 mmol/L — ABNORMAL LOW (ref 98–111)
Creatinine, Ser: 0.98 mg/dL (ref 0.61–1.24)
GFR, Estimated: >60 mL/min (ref >60)
Glucose, Bld: 138 mg/dL — ABNORMAL HIGH (ref 70–99)
Potassium: 3.7 mmol/L (ref 3.5–5.1)
Sodium: 139 mmol/L (ref 135–145)

## 2021-10-27 LAB — RESP PANEL BY RT-PCR (FLU A&B, COVID) ARPGX2
Influenza A by PCR: NEGATIVE
Influenza B by PCR: NEGATIVE
SARS Coronavirus 2 by RT PCR: NEGATIVE

## 2021-10-27 LAB — BRAIN NATRIURETIC PEPTIDE: B Natriuretic Peptide: 82 pg/mL (ref 0.0–100.0)

## 2021-10-27 LAB — D-DIMER, QUANTITATIVE: D-Dimer, Quant: 0.36 ug/mL-FEU (ref 0.00–0.50)

## 2021-10-27 LAB — FOLATE: Folate: 21.5 ng/mL (ref >5.9)

## 2021-10-27 LAB — MRSA NEXT GEN BY PCR, NASAL: MRSA by PCR Next Gen: NOT DETECTED

## 2021-10-27 LAB — PHOSPHORUS: Phosphorus: 4 mg/dL (ref 2.5–4.6)

## 2021-10-27 LAB — MAGNESIUM: Magnesium: 3 mg/dL — ABNORMAL HIGH (ref 1.7–2.4)

## 2021-10-27 IMAGING — CT CT HEAD W/O CM
3 series · 16 of 47 positions shown, 19 images · non-contrast
Comparison: Brain MRI [DATE]

CLINICAL DATA: Mental status change.

EXAM:
CT HEAD WITHOUT CONTRAST
TECHNIQUE: Contiguous axial images were obtained from the base of the skull
through the vertex without intravenous contrast.

[Series 2: head w o · axial · 0.43mm/px · z∈[+119,+249]mm · 10 of 32 slices shown, 13 images]
[im 3/32  brain]
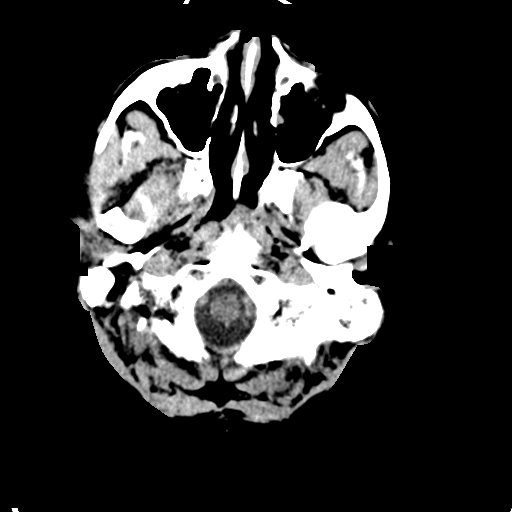
[im 3/32  bone]
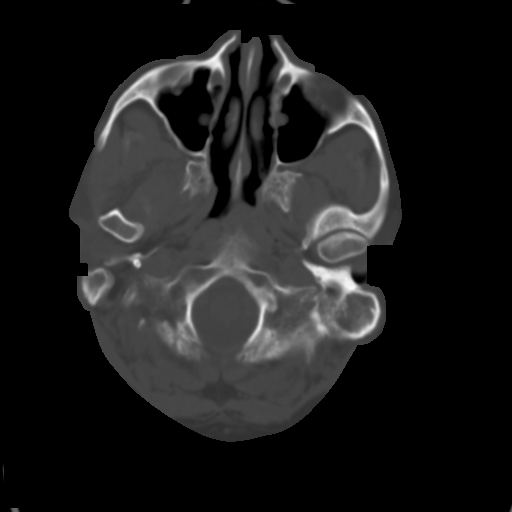
[im 6/32  brain]
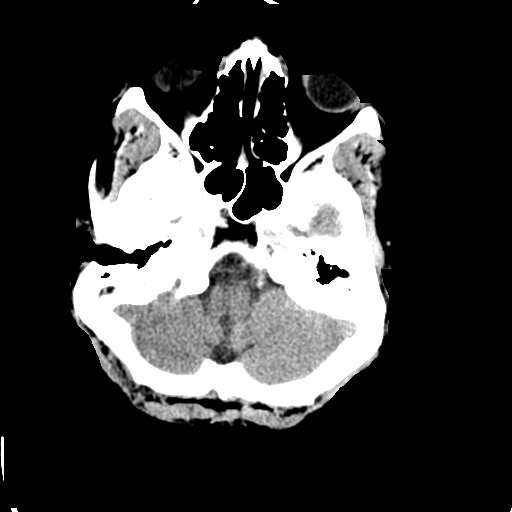
[im 9/32  brain]
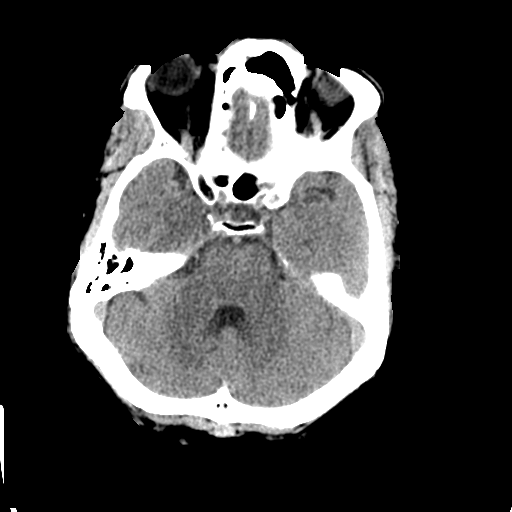
[im 11/32  brain]
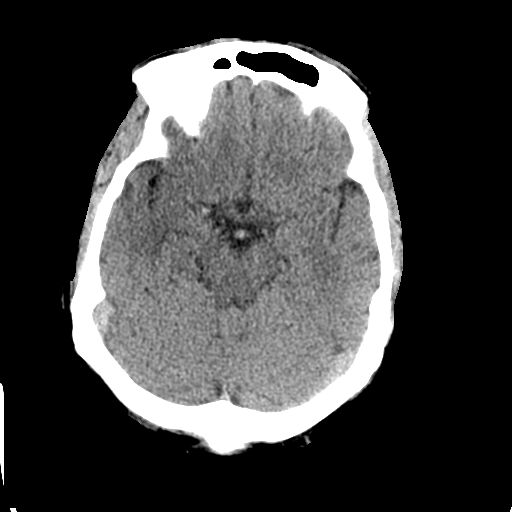
[im 14/32  brain]
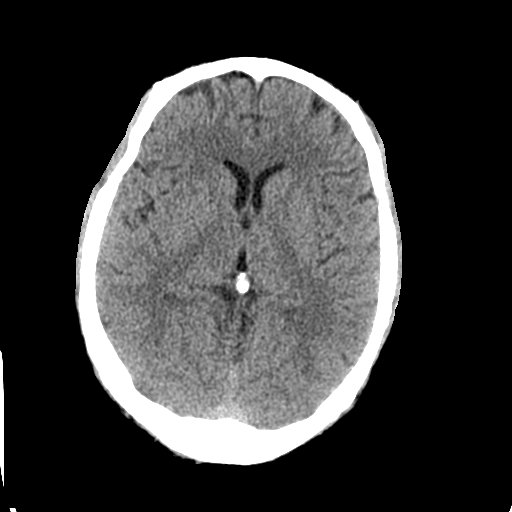
[im 14/32  bone]
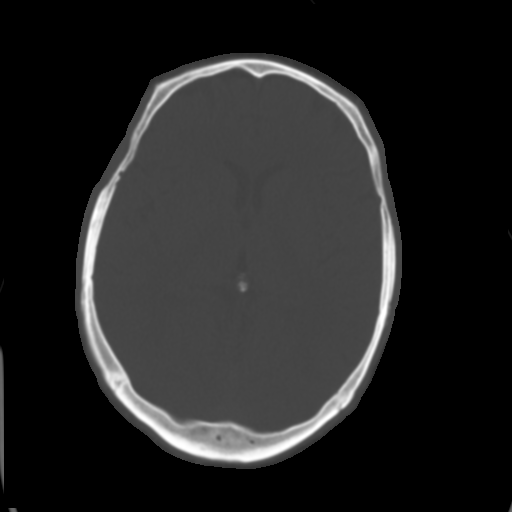
[im 18/32  brain]
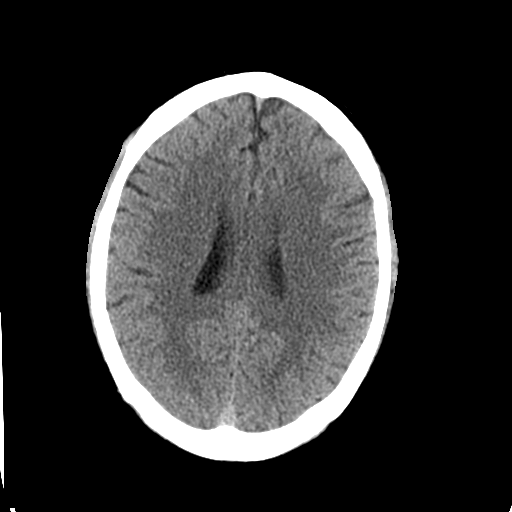
[im 21/32  brain]
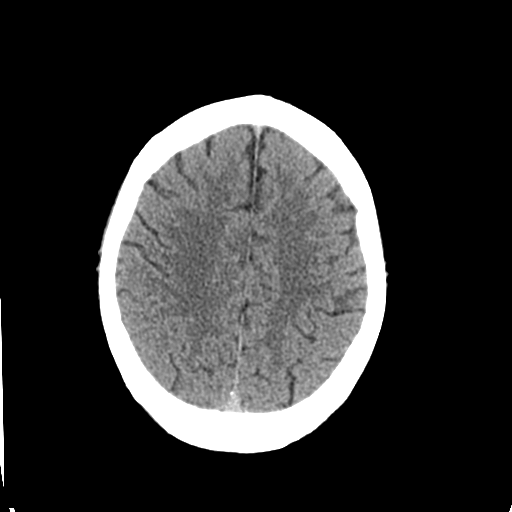
[im 24/32  brain]
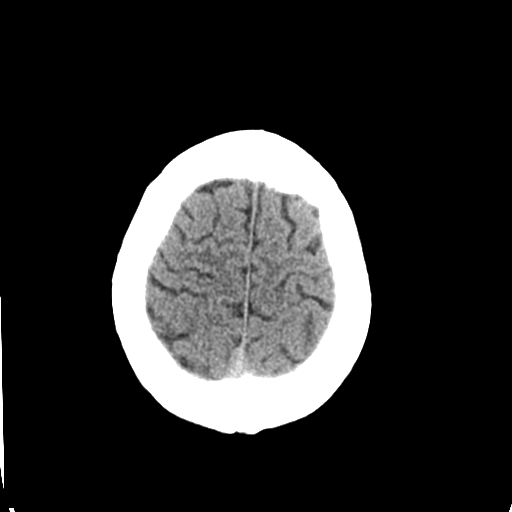
[im 26/32  brain]
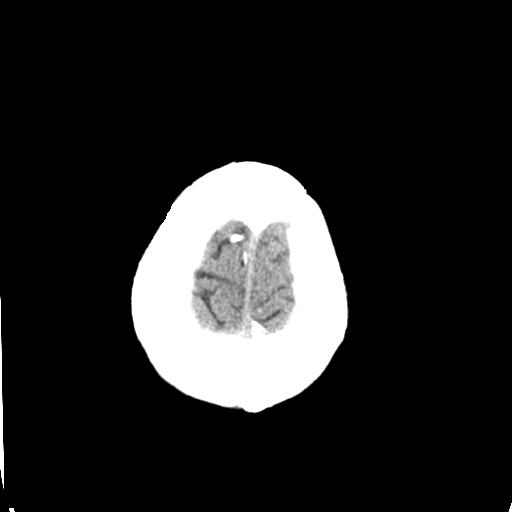
[im 26/32  bone]
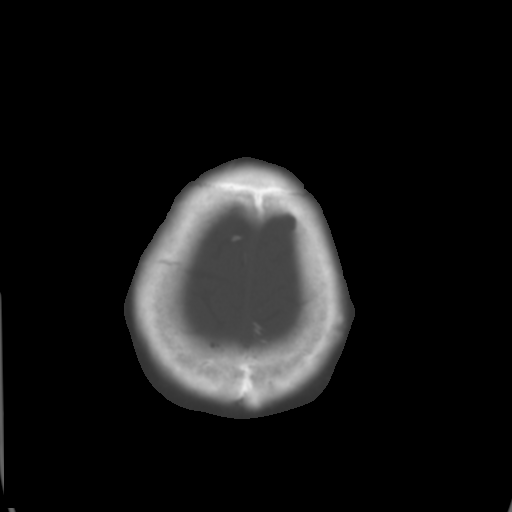
[im 29/32  brain]
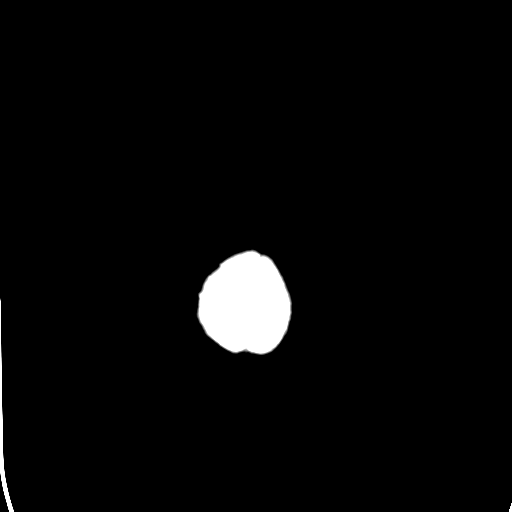

[Series 4: coronal soft · coronal · 0.32mm/px · 3 of 71 slices shown]
[im 24/71  brain]
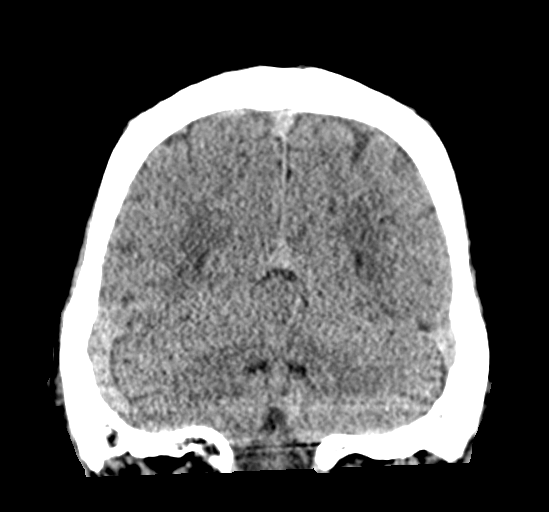
[im 32/71  brain]
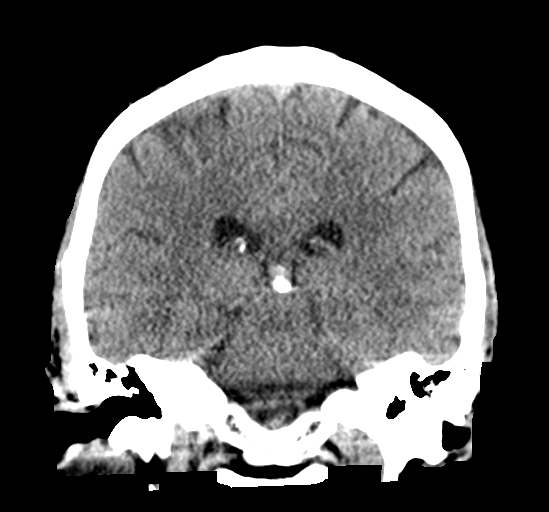
[im 39/71  brain]
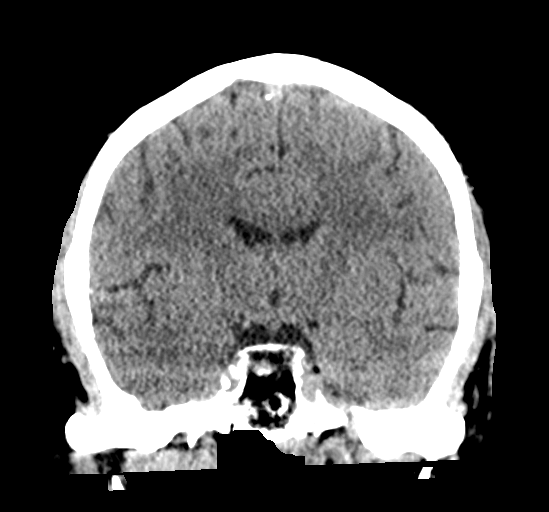

[Series 5: sagittal soft · sagittal · 0.36mm/px · 3 of 58 slices shown]
[im 20/58  brain]
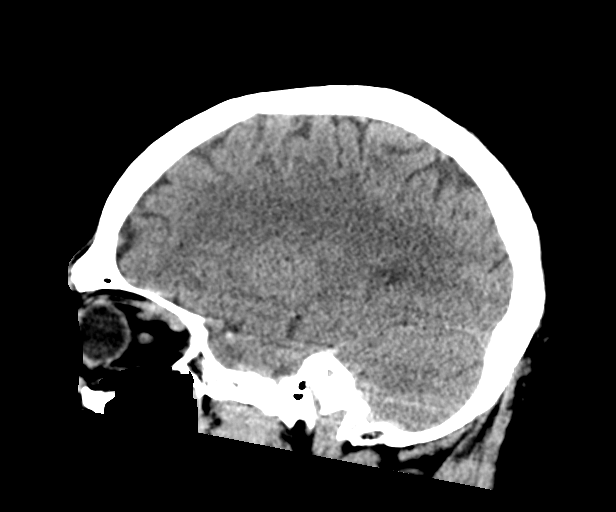
[im 29/58  brain]
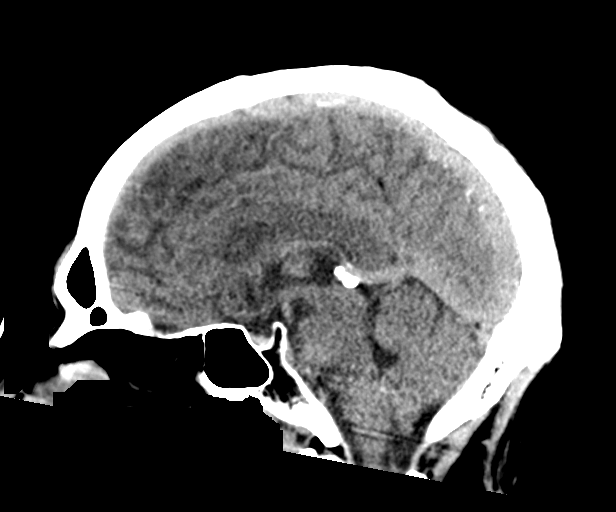
[im 39/58  brain]
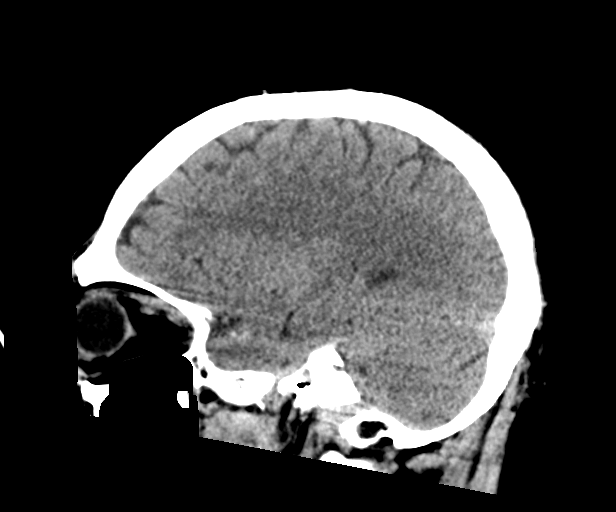

[16 of 47 positions shown; findings below may reference images not displayed]

FINDINGS: Brain: No evidence of acute infarction, hemorrhage, hydrocephalus,
extra-axial collection or mass lesion/mass effect. There is mild
diffuse low-attenuation within the subcortical and periventricular
white matter compatible with chronic microvascular disease.

Vascular: No hyperdense vessel or unexpected calcification.

Skull: Normal. Negative for fracture or focal lesion.

Sinuses/Orbits: No acute finding.

Other: None.
IMPRESSION: 1. No acute intracranial abnormalities.
2. Mild chronic small vessel ischemic change.

## 2021-10-27 MED ORDER — HYDRALAZINE HCL 20 MG/ML IJ SOLN: 10.0000 mg | Freq: Four times a day (QID) | INTRAMUSCULAR | Status: DC | PRN

## 2021-10-27 MED ORDER — AMLODIPINE BESYLATE 5 MG PO TABS
10.0000 mg | ORAL_TABLET | Freq: Every day | ORAL | Status: DC
  Administered 2021-10-27 – 2021-10-30 (×4): 10 mg via ORAL
  Filled 2021-10-27 (×4): qty 2

## 2021-10-27 MED ORDER — MORPHINE SULFATE (PF) 4 MG/ML IV SOLN
4.0000 mg | Freq: Once | INTRAVENOUS | Status: AC
  Administered 2021-10-27: 4 mg via INTRAVENOUS
  Filled 2021-10-27: qty 1

## 2021-10-27 MED ORDER — MAGNESIUM SULFATE 2 GM/50ML IV SOLN
2.0000 g | Freq: Once | INTRAVENOUS | Status: AC
  Administered 2021-10-27: 2 g via INTRAVENOUS
  Filled 2021-10-27: qty 50

## 2021-10-27 MED ORDER — IPRATROPIUM-ALBUTEROL 0.5-2.5 (3) MG/3ML IN SOLN
3.0000 mL | Freq: Once | RESPIRATORY_TRACT | Status: AC
  Administered 2021-10-27: 3 mL via RESPIRATORY_TRACT
  Filled 2021-10-27: qty 3

## 2021-10-27 MED ORDER — IPRATROPIUM-ALBUTEROL 0.5-2.5 (3) MG/3ML IN SOLN
3.0000 mL | Freq: Four times a day (QID) | RESPIRATORY_TRACT | Status: DC
  Administered 2021-10-27 – 2021-10-30 (×11): 3 mL via RESPIRATORY_TRACT
  Filled 2021-10-27 (×13): qty 3

## 2021-10-27 MED ORDER — ASPIRIN 81 MG PO CHEW
324.0000 mg | CHEWABLE_TABLET | Freq: Once | ORAL | Status: AC
  Administered 2021-10-27: 324 mg via ORAL
  Filled 2021-10-27: qty 4

## 2021-10-27 MED ORDER — IPRATROPIUM BROMIDE 0.02 % IN SOLN
0.5000 mg | Freq: Once | RESPIRATORY_TRACT | Status: AC
  Administered 2021-10-27: 0.5 mg via RESPIRATORY_TRACT
  Filled 2021-10-27: qty 2.5

## 2021-10-27 MED ORDER — ACETAMINOPHEN 650 MG RE SUPP: 650.0000 mg | Freq: Four times a day (QID) | RECTAL | Status: DC | PRN

## 2021-10-27 MED ORDER — METHYLPREDNISOLONE SODIUM SUCC 40 MG IJ SOLR
40.0000 mg | Freq: Two times a day (BID) | INTRAMUSCULAR | Status: DC
  Administered 2021-10-27 – 2021-10-30 (×6): 40 mg via INTRAVENOUS
  Filled 2021-10-27 (×6): qty 1

## 2021-10-27 MED ORDER — KETOROLAC TROMETHAMINE 15 MG/ML IJ SOLN
15.0000 mg | Freq: Once | INTRAMUSCULAR | Status: AC
  Administered 2021-10-27: 15 mg via INTRAVENOUS
  Filled 2021-10-27: qty 1

## 2021-10-27 MED ORDER — METHYLPREDNISOLONE SODIUM SUCC 40 MG IJ SOLR
40.0000 mg | Freq: Two times a day (BID) | INTRAMUSCULAR | Status: DC
  Administered 2021-10-27: 40 mg via INTRAVENOUS
  Filled 2021-10-27: qty 1

## 2021-10-27 MED ORDER — PANTOPRAZOLE SODIUM 40 MG PO TBEC
40.0000 mg | DELAYED_RELEASE_TABLET | Freq: Every day | ORAL | Status: DC
  Administered 2021-10-27 – 2021-10-30 (×4): 40 mg via ORAL
  Filled 2021-10-27 (×4): qty 1

## 2021-10-27 MED ORDER — METHYLPREDNISOLONE SODIUM SUCC 40 MG IJ SOLR: 40.0000 mg | Freq: Two times a day (BID) | INTRAMUSCULAR | Status: DC

## 2021-10-27 MED ORDER — ALBUTEROL (5 MG/ML) CONTINUOUS INHALATION SOLN
10.0000 mg/h | INHALATION_SOLUTION | Freq: Once | RESPIRATORY_TRACT | Status: DC
  Filled 2021-10-27: qty 20

## 2021-10-27 MED ORDER — SENNOSIDES-DOCUSATE SODIUM 8.6-50 MG PO TABS
2.0000 | ORAL_TABLET | Freq: Every day | ORAL | Status: DC
  Administered 2021-10-27 – 2021-10-29 (×3): 2 via ORAL
  Filled 2021-10-27 (×3): qty 2

## 2021-10-27 MED ORDER — IPRATROPIUM-ALBUTEROL 0.5-2.5 (3) MG/3ML IN SOLN: 3.0000 mL | Freq: Four times a day (QID) | RESPIRATORY_TRACT | Status: DC

## 2021-10-27 MED ORDER — METHYLPREDNISOLONE SODIUM SUCC 125 MG IJ SOLR
125.0000 mg | Freq: Once | INTRAMUSCULAR | Status: AC
  Administered 2021-10-27: 125 mg via INTRAVENOUS
  Filled 2021-10-27: qty 2

## 2021-10-27 MED ORDER — ALBUTEROL SULFATE (2.5 MG/3ML) 0.083% IN NEBU
INHALATION_SOLUTION | RESPIRATORY_TRACT | Status: AC
  Filled 2021-10-27: qty 12

## 2021-10-27 MED ORDER — ENOXAPARIN SODIUM 40 MG/0.4ML IJ SOSY
40.0000 mg | PREFILLED_SYRINGE | INTRAMUSCULAR | Status: DC
  Administered 2021-10-27 – 2021-10-30 (×4): 40 mg via SUBCUTANEOUS
  Filled 2021-10-27 (×4): qty 0.4

## 2021-10-27 MED ORDER — CHLORHEXIDINE GLUCONATE CLOTH 2 % EX PADS
6.0000 | MEDICATED_PAD | Freq: Every day | CUTANEOUS | Status: DC
  Administered 2021-10-27 – 2021-10-30 (×4): 6 via TOPICAL

## 2021-10-27 MED ORDER — ORAL CARE MOUTH RINSE
15.0000 mL | Freq: Two times a day (BID) | OROMUCOSAL | Status: DC
  Administered 2021-10-27 – 2021-10-29 (×6): 15 mL via OROMUCOSAL

## 2021-10-27 MED ORDER — AZITHROMYCIN 250 MG PO TABS
250.0000 mg | ORAL_TABLET | Freq: Every day | ORAL | Status: DC
  Administered 2021-10-28 – 2021-10-30 (×3): 250 mg via ORAL
  Filled 2021-10-27 (×3): qty 1

## 2021-10-27 MED ORDER — ALUM & MAG HYDROXIDE-SIMETH 200-200-20 MG/5ML PO SUSP
30.0000 mL | Freq: Once | ORAL | Status: AC
  Administered 2021-10-27: 30 mL via ORAL
  Filled 2021-10-27: qty 30

## 2021-10-27 MED ORDER — MOMETASONE FURO-FORMOTEROL FUM 200-5 MCG/ACT IN AERO
2.0000 | INHALATION_SPRAY | Freq: Two times a day (BID) | RESPIRATORY_TRACT | Status: DC
  Administered 2021-10-27 – 2021-10-30 (×7): 2 via RESPIRATORY_TRACT
  Filled 2021-10-27: qty 8.8

## 2021-10-27 MED ORDER — ACETAMINOPHEN 325 MG PO TABS
650.0000 mg | ORAL_TABLET | Freq: Four times a day (QID) | ORAL | Status: DC | PRN
  Filled 2021-10-27: qty 2

## 2021-10-27 MED ORDER — HYDROXYZINE HCL 25 MG PO TABS
25.0000 mg | ORAL_TABLET | Freq: Three times a day (TID) | ORAL | Status: DC | PRN
  Administered 2021-10-27 – 2021-10-30 (×4): 25 mg via ORAL
  Filled 2021-10-27 (×4): qty 1

## 2021-10-27 MED ORDER — ALBUTEROL SULFATE (2.5 MG/3ML) 0.083% IN NEBU
2.5000 mg | INHALATION_SOLUTION | RESPIRATORY_TRACT | Status: DC | PRN
  Administered 2021-10-29: 2.5 mg via RESPIRATORY_TRACT
  Filled 2021-10-27: qty 3

## 2021-10-27 MED ORDER — ATORVASTATIN CALCIUM 40 MG PO TABS
40.0000 mg | ORAL_TABLET | Freq: Every day | ORAL | Status: DC
  Administered 2021-10-27 – 2021-10-30 (×4): 40 mg via ORAL
  Filled 2021-10-27 (×4): qty 1

## 2021-10-27 MED ORDER — PREDNISONE 20 MG PO TABS: 40.0000 mg | ORAL_TABLET | Freq: Every day | ORAL | Status: DC

## 2021-10-27 MED ORDER — AZITHROMYCIN 250 MG PO TABS
500.0000 mg | ORAL_TABLET | Freq: Every day | ORAL | Status: AC
  Administered 2021-10-27: 500 mg via ORAL
  Filled 2021-10-27: qty 2

## 2021-10-27 MED ORDER — IPRATROPIUM-ALBUTEROL 0.5-2.5 (3) MG/3ML IN SOLN: 3.0000 mL | RESPIRATORY_TRACT | Status: DC | PRN

## 2021-10-27 MED ORDER — DM-GUAIFENESIN ER 30-600 MG PO TB12
1.0000 | ORAL_TABLET | Freq: Two times a day (BID) | ORAL | Status: DC
  Administered 2021-10-27 – 2021-10-30 (×7): 1 via ORAL
  Filled 2021-10-27 (×7): qty 1

## 2021-10-27 NOTE — Progress Notes (Signed)
Patient transported to CT and then ICU8 without complication. He was removed from [place] and placed on 4lpm [place]. He is resting and comfortable at this time.

## 2021-10-27 NOTE — Progress Notes (Signed)
While discussing pt's care and home management of symptoms pt stated he does currently smoke "crack" cocaine / "freebase". I asked pt when his last use was? Pt stated he had smoked a small amount of "crack" cocaine the night of hospitalization. Pt stated he is currently in a treatment program and has plans to quit following DC.

## 2021-10-27 NOTE — Progress Notes (Signed)
Patient complained of starting to feel tightness in the chest and feeling short of breath. O2 sat 93% on 4 Liters O2 and patient denied chest tightness/pain radiating to extremities or neck/jaw. Respiratory called for breathing treatment and Dr Joesph Fillers made aware. EKG ordered and done. EKG result shown to Dr Joesph Fillers.

## 2021-10-27 NOTE — Progress Notes (Addendum)
PROGRESS NOTE     Tony Sullivan, is a 60 y.o. male, DOB - October 13, 1961, ERD:[id number]  Admit date - 10/27/2021   Admitting Physician Bernadette Hoit, DO  Outpatient Primary MD for the patient is [name], FNP  LOS - 0  Chief Complaint  Patient presents with   Shortness of Breath        Brief Narrative:  60 y.o. male with medical history significant for COPD on 3-4 LPM of home oxygen, GERD, hypertension, anxiety, osteoarthritis, tobacco and cocaine use and history of cervical fusion admitted on 10/26/2021 with acute on chronic hypoxic and hypercapnic respiratory failure with uncompensated respiratory acidosis - Per report, EMS was activated due to shortness of breath, on arrival of EMS team, patient was unresponsive, O2 sat was noted to be in the 60s, he was placed on a NRB and was taken to the ED for further evaluation and management  Assessment & Plan:   Principal Problem:   COPD with acute exacerbation ([place]) Active Problems:   Acute on chronic respiratory failure with hypoxia and hypercapnia (HCC)   Leukocytosis   Elevated troponin   Hyperglycemia   Elevated MCV   1)Acute COPD Exacerbation- no definite pneumonia,  treat empirically with IV Solu-Medrol 40 mg every 12 hours, give mucolytics, azithromycin and bronchodilators as ordered, supplemental oxygen as ordered.   2) acute on chronic hypoxic and hypercapnic respiratory failure--secondary to #1 above -Patient required prolonged BiPAP use initially -ABG showed pH of 7.26 with PCO2 of 17.2-70 7 repeat ABG with pH of 7.31 PCO2 of 69.7 -Continue BiPAP nightly and as needed -Treat as above #1 -At baseline patient supposed to be on 3L/min of oxygen at Home admits to noncompliance due to need to smoke tobacco -Initially on arrival via EMS apparently patient's O2 sats were in the 60s, required continuous BiPAP  3) elevated troponin--- due to demand ischemia in the setting of respiratory failure with hypoxia -Troponin  20>>48>>100>>92 >>99 -EKG nonacute -Echo with EF of 55 to 60%, no wall motion normalities, grade 1 diastolic dysfunction noted  4)HFpEF--- echo as above #3 --BNP 82, chest x-ray without pulmonary edema -Appears compensated from CHF standpoint,  5)GERD--- Protonix this patient -Steroids  6)HTN--Amlodipine 10 mg daily,   may use IV Hydralazine 10 mg  Every 4 hours Prn for systolic blood pressure over 170 mmhg  7) social/ethics--- patient is a full code  -Total care time, 43 minutes  Disposition/Need for in-Hospital Stay- patient unable to be discharged at this time due to --hypoxic respiratory failure with hypercapnia and uncompensated respiratory acidosis--- requiring BiPAP, IV steroids and supplemental oxygen  Status is: Inpatient  Remains inpatient appropriate because:   Disposition: The patient is from: Home              Anticipated d/c is to: Home              Anticipated d/c date is: 2 days              Patient currently is not medically stable to d/c. Barriers: Not Clinically Stable-   Code Status :  -  Code Status: Full Code   Family Communication:    NA (patient is alert, awake and coherent)   Consults  :    DVT Prophylaxis  :   - SCDs  enoxaparin (LOVENOX) injection 40 mg Start: 10/27/21 0800 SCDs Start: 10/27/21 0709    Lab Results  Component Value Date   PLT 249 10/27/2021    Inpatient Medications  Scheduled Meds:  albuterol  10 mg/hr Nebulization Once   atorvastatin  40 mg Oral Daily   [START ON 10/28/2021] azithromycin  250 mg Oral Daily   Chlorhexidine Gluconate Cloth  6 each Topical Daily   dextromethorphan-guaiFENesin  1 tablet Oral BID   enoxaparin (LOVENOX) injection  40 mg Subcutaneous Q24H   ipratropium-albuterol  3 mL Nebulization Q6H   mouth rinse  15 mL Mouth Rinse BID   methylPREDNISolone (SOLU-MEDROL) injection  40 mg Intravenous Q12H   mometasone-formoterol  2 puff Inhalation BID   pantoprazole  40 mg Oral Daily   senna-docusate  2  tablet Oral QHS   Continuous Infusions: PRN Meds:.acetaminophen **OR** acetaminophen, albuterol, hydrOXYzine   Anti-infectives (From admission, onward)    Start     Dose/Rate Route Frequency Ordered Stop   10/28/21 1000  azithromycin (ZITHROMAX) tablet 250 mg       See Hyperspace for full Linked Orders Report.   250 mg Oral Daily 10/27/21 0640 11/01/21 0959   10/27/21 1000  azithromycin (ZITHROMAX) tablet 500 mg       See Hyperspace for full Linked Orders Report.   500 mg Oral Daily 10/27/21 0640 10/27/21 0914         Subjective: Tony Sullivan today has no fevers, no emesis,  No chest pain,   No fever  Or chills   No Nausea, Vomiting or Diarrhea -Cough, Dyspnea persist,  Objective: Vitals:   10/27/21 1322 10/27/21 1400 10/27/21 1500 10/27/21 1600  BP:  123/77 (!) 154/90 (!) 158/86  Pulse:  65 85 82  Resp:  18 18 20   Temp:    97.9 F (36.6 C)  TempSrc:    Oral  SpO2: 94% 96% 93% 92%  Weight:      Height:        Intake/Output Summary (Last 24 hours) at 10/27/2021 1623 Last data filed at 10/27/2021 0345 Gross per 24 hour  Intake 50 ml  Output --  Net 50 ml   Filed Weights   10/27/21 0920  Weight: 65.6 kg    Physical Exam  Gen:- Awake Alert, conversational dyspnea HEENT:- [place].AT, No sclera icterus NOse-BiPAP alternating with nasal cannula Neck-Supple Neck,No JVD,.  Lungs-diminished breath sounds with scattered wheezing expiratory CV- S1, S2 normal, regular  Abd-  +ve B.Sounds, Abd Soft, No tenderness,    Extremity/Skin:- No  edema, pedal pulses present  Psych-affect is anxious,, oriented x3 Neuro-no new focal deficits, no tremors  Data Reviewed: I have personally reviewed following labs and imaging studies  CBC: Recent Labs  Lab 10/27/21 0032  WBC 12.4*  NEUTROABS 10.0*  HGB 15.3  HCT 47.2  MCV 100.6*  PLT 219   Basic Metabolic Panel: Recent Labs  Lab 10/27/21 0032 10/27/21 0728  NA 139  --   K 3.7  --   CL 95*  --   CO2 33*  --    GLUCOSE 138*  --   BUN 21*  --   CREATININE 0.98  --   CALCIUM 9.0  --   MG  --  3.0*  PHOS  --  4.0   GFR: Estimated Creatinine Clearance: 76.2 mL/min (by C-G formula based on SCr of 0.98 mg/dL). Liver Function Tests: No results for input(s): AST, ALT, ALKPHOS, BILITOT, PROT, ALBUMIN in the last 168 hours. No results for input(s): LIPASE, AMYLASE in the last 168 hours. No results for input(s): AMMONIA in the last 168 hours. Coagulation Profile: No results for input(s): INR, PROTIME in the last 168  hours. Cardiac Enzymes: No results for input(s): CKTOTAL, CKMB, CKMBINDEX, TROPONINI in the last 168 hours. BNP (last 3 results) No results for input(s): PROBNP in the last 8760 hours. HbA1C: No results for input(s): HGBA1C in the last 72 hours. CBG: No results for input(s): GLUCAP in the last 168 hours. Lipid Profile: No results for input(s): CHOL, HDL, LDLCALC, TRIG, CHOLHDL, LDLDIRECT in the last 72 hours. Thyroid Function Tests: No results for input(s): TSH, T4TOTAL, FREET4, T3FREE, THYROIDAB in the last 72 hours. Anemia Panel: Recent Labs    10/27/21 0728  VITAMINB12 733  FOLATE 21.5   Urine analysis:    Component Value Date/Time   COLORURINE YELLOW 10/16/2021 0130   APPEARANCEUR CLEAR 10/16/2021 0130   LABSPEC 1.020 10/16/2021 0130   PHURINE 6.0 10/16/2021 0130   GLUCOSEU NEGATIVE 10/16/2021 0130   HGBUR TRACE (A) 10/16/2021 0130   BILIRUBINUR NEGATIVE 10/16/2021 0130   KETONESUR NEGATIVE 10/16/2021 0130   PROTEINUR NEGATIVE 10/16/2021 0130   UROBILINOGEN 1.0 06/12/2015 1021   NITRITE NEGATIVE 10/16/2021 0130   LEUKOCYTESUR NEGATIVE 10/16/2021 0130   Sepsis Labs: @LABRCNTIP (procalcitonin:4,lacticidven:4)  ) Recent Results (from the past 240 hour(s))  Resp Panel by RT-PCR (Flu A&B, Covid) Nasopharyngeal Swab     Status: None   Collection Time: 10/27/21 12:43 AM   Specimen: Nasopharyngeal Swab; Nasopharyngeal(NP) swabs in vial transport medium  Result Value  Ref Range Status   SARS Coronavirus 2 by RT PCR NEGATIVE NEGATIVE Final    Comment: (NOTE) SARS-CoV-2 target nucleic acids are NOT DETECTED.  The SARS-CoV-2 RNA is generally detectable in upper respiratory specimens during the acute phase of infection. The lowest concentration of SARS-CoV-2 viral copies this assay can detect is 138 copies/mL. A negative result does not preclude SARS-Cov-2 infection and should not be used as the sole basis for treatment or other patient management decisions. A negative result may occur with  improper specimen collection/handling, submission of specimen other than nasopharyngeal swab, presence of viral mutation(s) within the areas targeted by this assay, and inadequate number of viral copies(<138 copies/mL). A negative result must be combined with clinical observations, patient history, and epidemiological information. The expected result is Negative.  Fact Sheet for Patients:  EntrepreneurPulse.com.au  Fact Sheet for Healthcare Providers:  IncredibleEmployment.be  This test is no t yet approved or cleared by the [place] FDA and  has been authorized for detection and/or diagnosis of SARS-CoV-2 by FDA under an Emergency Use Authorization (EUA). This EUA will remain  in effect (meaning this test can be used) for the duration of the COVID-19 declaration under Section 564(b)(1) of the Act, 21 U.S.C.section 360bbb-3(b)(1), unless the authorization is terminated  or revoked sooner.       Influenza A by PCR NEGATIVE NEGATIVE Final   Influenza B by PCR NEGATIVE NEGATIVE Final    Comment: (NOTE) The Xpert Xpress SARS-CoV-2/FLU/RSV plus assay is intended as an aid in the diagnosis of influenza from Nasopharyngeal swab specimens and should not be used as a sole basis for treatment. Nasal washings and aspirates are unacceptable for Xpert Xpress SARS-CoV-2/FLU/RSV testing.  Fact Sheet for  Patients: EntrepreneurPulse.com.au  Fact Sheet for Healthcare Providers: IncredibleEmployment.be  This test is not yet approved or cleared by the [place] FDA and has been authorized for detection and/or diagnosis of SARS-CoV-2 by FDA under an Emergency Use Authorization (EUA). This EUA will remain in effect (meaning this test can be used) for the duration of the COVID-19 declaration under Section 564(b)(1) of the Act, 21 U.S.C.  section 360bbb-3(b)(1), unless the authorization is terminated or revoked.  Performed at [facility], [address], [place], [place] [address]   MRSA Next Gen by PCR, Nasal     Status: None   Collection Time: 10/27/21  9:27 AM   Specimen: Nasal Mucosa; Nasal Swab  Result Value Ref Range Status   MRSA by PCR Next Gen NOT DETECTED NOT DETECTED Final    Comment: (NOTE) The GeneXpert MRSA Assay (FDA approved for NASAL specimens only), is one component of a comprehensive MRSA colonization surveillance program. It is not intended to diagnose MRSA infection nor to guide or monitor treatment for MRSA infections. Test performance is not FDA approved in patients less than 60 years old. Performed at [facility], [address] [place], [place] [address]       Radiology Studies: CT Head Wo Contrast  Result Date: 10/27/2021 CLINICAL DATA:  Mental status change. EXAM: CT HEAD WITHOUT CONTRAST TECHNIQUE: Contiguous axial images were obtained from the base of the skull through the vertex without intravenous contrast. COMPARISON:  Brain MRI 05/06/2021 FINDINGS: Brain: No evidence of acute infarction, hemorrhage, hydrocephalus, extra-axial collection or mass lesion/mass effect. There is mild diffuse low-attenuation within the subcortical and periventricular white matter compatible with chronic microvascular disease. Vascular: No hyperdense vessel or unexpected calcification. Skull: Normal. Negative for fracture or focal lesion.  Sinuses/Orbits: No acute finding. Other: None. IMPRESSION: 1. No acute intracranial abnormalities. 2. Mild chronic small vessel ischemic change. Electronically Signed   By: Kerby Moors M.D.   On: 10/27/2021 09:34   DG Chest Portable 1 View  Result Date: 10/27/2021 CLINICAL DATA:  Dyspnea, COPD EXAM: PORTABLE CHEST 1 VIEW COMPARISON:  10/17/2021 FINDINGS: Stable pulmonary hyperinflation. Paucity of vasculature within the lung apices in keeping with underlying bullous emphysema. No focal pulmonary nodule or infiltrate. Healed right posterior sixth rib fracture again noted. No pneumothorax or pleural effusion. Cardiac size within normal limits. No acute bone abnormality. IMPRESSION: No active disease.  Emphysema with stable pulmonary hyperinflation. Electronically Signed   By: Fidela Salisbury M.D.   On: 10/27/2021 01:12   ECHOCARDIOGRAM COMPLETE  Result Date: 10/27/2021    ECHOCARDIOGRAM REPORT   Patient Name:   JAHZION BROGDEN Gomez Date of Exam: 10/27/2021 Medical Rec #:  [id number]      Height:       71.0 in Accession #:    [id number]     Weight:       144.6 lb Date of Birth:  09-Dec-1960       BSA:          1.837 m Patient Age:    66 years       BP:           120/71 mmHg Patient Gender: M              HR:           63 bpm. Exam Location:  [facility] Procedure: 2D Echo, Cardiac Doppler and Color Doppler Indications:    Dyspnea  History:        Patient has prior history of Echocardiogram examinations, most                 recent 05/05/2021. COPD; Risk Factors:Current Smoker and                 Hypertension.  Sonographer:    Bernadene Person RDCS Referring Phys: [place]  1. Left ventricular ejection fraction, by estimation, is 55 to  60%. The left ventricle has normal function. The left ventricle has no regional wall motion abnormalities. Left ventricular diastolic parameters are consistent with Grade I diastolic dysfunction (impaired relaxation).  2. Right ventricular systolic function is  normal. The right ventricular size is normal. There is normal pulmonary artery systolic pressure. The estimated right ventricular systolic pressure is 16.1 mmHg.  3. The mitral valve is grossly normal. No evidence of mitral valve regurgitation.  4. The aortic valve is tricuspid. Aortic valve regurgitation is not visualized.  5. The inferior vena cava is dilated in size with >50% respiratory variability, suggesting right atrial pressure of 8 mmHg. Comparison(s): Changes from prior study are noted. 05/05/2021: LVEF 60-65%, grade 1 DD. FINDINGS  Left Ventricle: Left ventricular ejection fraction, by estimation, is 55 to 60%. The left ventricle has normal function. The left ventricle has no regional wall motion abnormalities. The left ventricular internal cavity size was normal in size. There is  no left ventricular hypertrophy. Left ventricular diastolic parameters are consistent with Grade I diastolic dysfunction (impaired relaxation). Indeterminate filling pressures. Right Ventricle: The right ventricular size is normal. No increase in right ventricular wall thickness. Right ventricular systolic function is normal. There is normal pulmonary artery systolic pressure. The tricuspid regurgitant velocity is 2.48 m/s, and  with an assumed right atrial pressure of 8 mmHg, the estimated right ventricular systolic pressure is 09.6 mmHg. Left Atrium: Left atrial size was normal in size. Right Atrium: Right atrial size was normal in size. Pericardium: There is no evidence of pericardial effusion. Mitral Valve: The mitral valve is grossly normal. No evidence of mitral valve regurgitation. Tricuspid Valve: The tricuspid valve is grossly normal. Tricuspid valve regurgitation is trivial. Aortic Valve: The aortic valve is tricuspid. Aortic valve regurgitation is not visualized. Pulmonic Valve: The pulmonic valve was normal in structure. Pulmonic valve regurgitation is not visualized. Aorta: The aortic root and ascending aorta are  structurally normal, with no evidence of dilitation. Venous: The inferior vena cava is dilated in size with greater than 50% respiratory variability, suggesting right atrial pressure of 8 mmHg. IAS/Shunts: No atrial level shunt detected by color flow Doppler.  LEFT VENTRICLE PLAX 2D LVIDd:         4.90 cm      Diastology LVIDs:         3.00 cm      LV e' medial:    7.78 cm/s LV PW:         0.80 cm      LV E/e' medial:  11.0 LV IVS:        0.80 cm      LV e' lateral:   6.89 cm/s LVOT diam:     2.00 cm      LV E/e' lateral: 12.4 LV SV:         53 LV SV Index:   29 LVOT Area:     3.14 cm  LV Volumes (MOD) LV vol d, MOD A2C: 110.0 ml LV vol d, MOD A4C: 117.0 ml LV vol s, MOD A2C: 49.4 ml LV vol s, MOD A4C: 47.1 ml LV SV MOD A2C:     60.6 ml LV SV MOD A4C:     117.0 ml LV SV MOD BP:      64.3 ml RIGHT VENTRICLE RV S prime:     11.90 cm/s TAPSE (M-mode): 2.2 cm LEFT ATRIUM           Index        RIGHT ATRIUM  Index LA diam:      3.10 cm 1.69 cm/m   RA Area:     13.10 cm LA Vol (A2C): 35.2 ml 19.16 ml/m  RA Volume:   32.20 ml  17.53 ml/m LA Vol (A4C): 26.4 ml 14.37 ml/m  AORTIC VALVE LVOT Vmax:   97.90 cm/s LVOT Vmean:  62.350 cm/s LVOT VTI:    0.168 m  AORTA Ao Root diam: 3.60 cm Ao Asc diam:  3.50 cm MITRAL VALVE                TRICUSPID VALVE MV Area (PHT): 3.85 cm     TR Peak grad:   24.6 mmHg MV Decel Time: 197 msec     TR Vmax:        248.00 cm/s MV E velocity: 85.30 cm/s MV A velocity: 119.00 cm/s  SHUNTS MV E/A ratio:  0.72         Systemic VTI:  0.17 m                             Systemic Diam: 2.00 cm Lyman Bishop MD Electronically signed by Lyman Bishop MD Signature Date/Time: 10/27/2021/3:23:50 PM    Final      Scheduled Meds:  albuterol  10 mg/hr Nebulization Once   atorvastatin  40 mg Oral Daily   [START ON 10/28/2021] azithromycin  250 mg Oral Daily   Chlorhexidine Gluconate Cloth  6 each Topical Daily   dextromethorphan-guaiFENesin  1 tablet Oral BID   enoxaparin (LOVENOX) injection   40 mg Subcutaneous Q24H   ipratropium-albuterol  3 mL Nebulization Q6H   mouth rinse  15 mL Mouth Rinse BID   methylPREDNISolone (SOLU-MEDROL) injection  40 mg Intravenous Q12H   mometasone-formoterol  2 puff Inhalation BID   pantoprazole  40 mg Oral Daily   senna-docusate  2 tablet Oral QHS   Continuous Infusions:   LOS: 0 days    [name] M.D on 10/27/2021 at 4:23 PM  Go to [email or web address] - for contact info  [facility] - Office  [phone]  If 7PM-7AM, please contact night-coverage [email or web address] Password [facility] 10/27/2021, 4:23 PM

## 2021-10-27 NOTE — ED Triage Notes (Signed)
Pt arrived via [facility] from home unconscious upon arrival with sats in 60s. Pt uses 4lmp via [place] at home

## 2021-10-27 NOTE — Progress Notes (Signed)
RT will place pt back on BIPAP after he has had his PM meds. Coordinated with RN.

## 2021-10-27 NOTE — H&P (Signed)
History and Physical  Tony Sullivan EGB:[id number] DOB: Aug 08, 1961 DOA: 10/27/2021  Referring physician: Ezequiel Essex, MD PCP: [name], FNP  Patient coming from: Home  Chief Complaint: Shortness of breath  HPI: Tony Sullivan is a 60 y.o. male with medical history significant for COPD on 3-4 LPM of home oxygen, GERD, hypertension, anxiety, osteoarthritis, tobacco and cocaine use and history of cervical fusion who presents to the emergency department due to shortness of breath.  Patient was unable to provide history possibly due to being in acute distress.  History was obtained from ED physician and ED medical record.  Per report, EMS was activated due to shortness of breath, on arrival of EMS team, patient was unresponsive, O2 sat was noted to be in the 60s, he was placed on a NRB and was taken to the ED for further evaluation and management.  Patient was reported to be more arousable by the time he arrived at the ED and was able to answer some questions, patient stated that the had chest cold for a few days and that this chest felt tight, he denied fever, vomiting, abdominal pain.  ED Course:  In the emergency department, he was hemodynamically stable.  Work-up in the ED showed leukocytosis, elevated MCV, BMP showed hyperglycemia.  ABG done showed hypercapnia.  D-dimer was normal, troponin x2- 20 > 48.,  BNP 82.0.  Influenza A, B, SARS coronavirus 2 was negative. Chest x-ray showed no active disease, but showed emphysema with stable pulmonary hyperinflation Breathing treatment was provided, Solu-Medrol 125 mg x 1 was given, morphine was given.  Hospitalist was asked to admit patient for further evaluation and management.  Review of Systems: This cannot be obtained at this time due to patient's current condition.  Past Medical History:  Diagnosis Date   Anxiety    Arthritis    COPD (chronic obstructive pulmonary disease) ([place]) 2004   diagnosed in 2004, no PFT's to date.   Started on home O2 12/2013, after found to be desatting at PCP's office, and referred to pulmonology.   Depression    GERD (gastroesophageal reflux disease)    High blood pressure    Prediabetes    Seasonal allergies    Tobacco dependence    Past Surgical History:  Procedure Laterality Date   ANTERIOR CERVICAL DECOMP/DISCECTOMY FUSION N/A 09/06/2019   Procedure: Anterior Cervical Discecectomy Fusion - Cerivcal Five-Cervical Six;  Surgeon: Kary Kos, MD;  Location: [facility];  Service: Neurosurgery;  Laterality: N/A;  Anterior Cervical Discecectomy Fusion - Cerivcal Five-Cervical Six   ANTERIOR CERVICAL DISCECTOMY  09/06/2019   APPENDECTOMY  1980   COLONOSCOPY WITH PROPOFOL N/A 12/10/2016   Procedure: COLONOSCOPY WITH PROPOFOL;  Surgeon: [name], MD;  Location: [facility];  Service: Endoscopy;  Laterality: N/A;   HIP SURGERY Right    SHOULDER ARTHROSCOPY Right     Social History:  reports that he has been smoking cigarettes. He has never used smokeless tobacco. He reports that he does not currently use alcohol after a past usage of about 14.0 - 21.0 standard drinks per week. He reports that he does not currently use drugs after having used the following drugs: Marijuana and Cocaine.   Allergies  Allergen Reactions   Penicillins Anaphylaxis    Has patient had a PCN reaction causing immediate rash, facial/tongue/throat swelling, SOB or lightheadedness with hypotension: Yes Has patient had a PCN reaction causing severe rash involving mucus membranes or skin necrosis: No Has patient had a PCN reaction  that required hospitalization No Has patient had a PCN reaction occurring within the last 10 years: No If all of the above answers are "NO", then may proceed with Cephalosporin use.    Levocetirizine Other (See Comments)    Muscle cramps    Family History  Problem Relation Age of Onset   Emphysema Mother    Allergies Mother        "everyone in family"   Asthma Mother         "everyone in family"   Heart disease Mother    Clotting disorder Mother    Cancer Mother    Emphysema Father    Allergies Father    Allergies Son    Seizures Brother    Diabetes Brother      Prior to Admission medications   Medication Sig Start Date End Date Taking? Authorizing Provider  albuterol (PROVENTIL) (2.5 MG/3ML) 0.083% nebulizer solution Take 3 mLs (2.5 mg total) by nebulization every 4 (four) hours as needed for wheezing or shortness of breath. 10/02/21 10/02/22  [name], MD  albuterol (VENTOLIN HFA) 108 (90 Base) MCG/ACT inhaler Inhale 2 puffs into the lungs every 6 (six) hours as needed for wheezing or shortness of breath. 09/24/21   Fransico Meadow, PA-C  atorvastatin (LIPITOR) 40 MG tablet TAKE ONE TABLET BY MOUTH DAILY Patient taking differently: Take 40 mg by mouth daily. 07/06/20   [name], MD  azithromycin (ZITHROMAX Z-PAK) 250 MG tablet Take 1 tablet (250 mg total) by mouth daily. Take one tablet a day beginning 11/29 09/24/21   Fransico Meadow, PA-C  benztropine (COGENTIN) 0.5 MG tablet Take 0.5 mg by mouth at bedtime.     [provider]  Budeson-Glycopyrrol-Formoterol (BREZTRI AEROSPHERE) 160-9-4.8 MCG/ACT AERO Inhale 2 puffs into the lungs in the morning and at bedtime. 07/25/21   [name], NP  Budeson-Glycopyrrol-Formoterol (BREZTRI AEROSPHERE) 160-9-4.8 MCG/ACT AERO Inhale 2 puffs into the lungs in the morning and at bedtime. Patient not taking: Reported on 09/07/2021 07/25/21   [name], NP  cetirizine (ZYRTEC) 10 MG tablet Take 10 mg by mouth daily.    [provider]  dextromethorphan-guaiFENesin (MUCINEX DM) 30-600 MG 12hr tablet Take 1 tablet by mouth 2 (two) times daily. 10/02/21   [name], MD  diclofenac Sodium (VOLTAREN) 1 % GEL Apply 2 g topically 4 (four) times daily.    [provider]  dicyclomine (BENTYL) 20 MG tablet Take 1 tablet (20 mg total) by mouth 3 (three) times daily before meals.  10/17/21   Orpah Greek, MD  dorzolamide (TRUSOPT) 2 % ophthalmic solution Place 1 drop into the left eye 2 (two) times daily. 12/12/20   [provider]  famotidine (PEPCID) 20 MG tablet TAKE 1 TABLET (20 MG TOTAL) BY MOUTH AT BEDTIME. 02/05/21   [name], MD  feeding supplement, GLUCERNA SHAKE, (GLUCERNA SHAKE) LIQD Take 237 mLs by mouth 3 (three) times daily between meals. 10/02/21   [name], MD  fluticasone (FLONASE) 50 MCG/ACT nasal spray Place 2 sprays into both nostrils 3 (three) times daily as needed for allergies.     [provider]  hydrochlorothiazide (HYDRODIURIL) 25 MG tablet Take 1 tablet (25 mg total) by mouth daily. 09/12/21   Tolia, Sunit, DO  latanoprost (XALATAN) 0.005 % ophthalmic solution Place 1 drop into both eyes at bedtime. 05/24/19   [provider]  loxapine (LOXITANE) 10 MG capsule Take 10 mg by mouth at  bedtime.     [provider]  megestrol (MEGACE) 40 MG tablet Take 40 mg by mouth daily.    [provider]  mirtazapine (REMERON) 30 MG tablet Take 30 mg by mouth at bedtime.    [provider]  Multiple Vitamin (MULTIVITAMIN) capsule Take 1 capsule by mouth daily.    [provider]  oxyCODONE-acetaminophen (PERCOCET) 5-325 MG tablet Take 1-2 tablets by mouth every 4 (four) hours as needed. 10/17/21   Orpah Greek, MD  OXYGEN Inhale 3-4 L into the lungs as directed. 3-4 liters with sleep and exertion as needed for low oxygen    [provider]  pantoprazole (PROTONIX) 40 MG tablet Take 40 mg by mouth daily.    [provider]  polyethylene glycol (MIRALAX / GLYCOLAX) 17 g packet TAKE 17 GRAMS BY MOUTH DAILY Patient taking differently: Take 17 g by mouth daily as needed for mild constipation. 03/09/20   [name], MD  predniSONE (DELTASONE) 10 MG tablet TAKE ONE TO TWO TABLETS BY MOUTH DAILY AS DIRECTED 10/05/21   [name], MD  predniSONE (DELTASONE)  10 MG tablet Take 50mg  daily for 3days,Take 40mg  daily for 3days,Take 30mg  daily for 3days,Take 20mg  daily for 3days,Take 10mg  daily for 3days, then stop 10/02/21   [name], MD  risperiDONE (RISPERDAL) 2 MG tablet Take 2 mg by mouth at bedtime.    [provider]  simethicone (MYLICON) 40 FK/8.1EX drops Take 0.6 mLs (40 mg total) by mouth 4 (four) times daily as needed for flatulence. 10/17/21   Orpah Greek, MD    Physical Exam: BP 125/89    Pulse 70    Resp 15    SpO2 97%   General: 60 y.o. year-old male he will Hyperium and in acute distress.  Alert and oriented x3. HEENT: NCAT, EOMI Neck: Supple, trachea medial Cardiovascular: Regular rate and rhythm with no rubs or gallops.  No thyromegaly or JVD noted.  No lower extremity edema. 2/4 pulses in all 4 extremities. Respiratory: Bilateral decreased breath sounds with increased work of breathing.  Abdomen: Soft, nontender nondistended with normal bowel sounds x4 quadrants. Muskuloskeletal: No cyanosis, clubbing or edema noted bilaterally Neuro: CN II-XII intact, strength 5/5 x 4, sensation, reflexes intact Skin: No ulcerative lesions noted or rashes Psychiatry:  Mood is appropriate for condition and setting          Labs on Admission:  Basic Metabolic Panel: Recent Labs  Lab 10/27/21 0032  NA 139  K 3.7  CL 95*  CO2 33*  GLUCOSE 138*  BUN 21*  CREATININE 0.98  CALCIUM 9.0   Liver Function Tests: No results for input(s): AST, ALT, ALKPHOS, BILITOT, PROT, ALBUMIN in the last 168 hours. No results for input(s): LIPASE, AMYLASE in the last 168 hours. No results for input(s): AMMONIA in the last 168 hours. CBC: Recent Labs  Lab 10/27/21 0032  WBC 12.4*  NEUTROABS 10.0*  HGB 15.3  HCT 47.2  MCV 100.6*  PLT 249   Cardiac Enzymes: No results for input(s): CKTOTAL, CKMB, CKMBINDEX, TROPONINI in the last 168 hours.  BNP (last 3 results) Recent Labs    06/03/21 0311 10/16/21 2347 10/27/21 0033   BNP 41.0 112.0* 82.0    ProBNP (last 3 results) No results for input(s): PROBNP in the last 8760 hours.  CBG: No results for input(s): GLUCAP in the last 168 hours.  Radiological Exams on Admission: DG Chest Portable 1 View  Result Date: 10/27/2021  CLINICAL DATA:  Dyspnea, COPD EXAM: PORTABLE CHEST 1 VIEW COMPARISON:  10/17/2021 FINDINGS: Stable pulmonary hyperinflation. Paucity of vasculature within the lung apices in keeping with underlying bullous emphysema. No focal pulmonary nodule or infiltrate. Healed right posterior sixth rib fracture again noted. No pneumothorax or pleural effusion. Cardiac size within normal limits. No acute bone abnormality. IMPRESSION: No active disease.  Emphysema with stable pulmonary hyperinflation. Electronically Signed   By: Fidela Salisbury M.D.   On: 10/27/2021 01:12    EKG: I independently viewed the EKG done and my findings are as followed: Sinus tachycardia at a rate of 104 bpm with APCs  Assessment/Plan Present on Admission:  COPD with acute exacerbation ([place])  Leukocytosis  Principal Problem:   COPD with acute exacerbation ([place]) Active Problems:   Acute on chronic respiratory failure with hypoxia and hypercapnia ([facility])   Leukocytosis   Elevated troponin   Hyperglycemia   Elevated MCV  Acute on chronic respiratory failure with hypoxia hypercapnia possibly secondary to acute exacerbation of COPD  Continue duo nebs, Mucinex, Solu-Medrol, azithromycin. Continue Protonix to prevent steroid-induced ulcer Continue incentive spirometry and flutter valve Continue BiPAP with plan to transition to supplemental oxygen to maintain O2 sat > 94% as tolerated (patient uses a supplemental oxygen 3-4 LPM at baseline) uses  Leukocytosis/hyperglycemia possibly secondary to reactive process This may be due to steroid effect WBC 12.4, BG 138 continue to monitor WBC and blood glucose levels with morning labs  Elevated MCV (100.6) Folate levels and vitamin  B12 levels will be checked  Elevated troponin possibly secondary to type II demand ischemia Troponin x2 -20 > 48; he denies any chest pain, no acute EKG changes Continue to trend troponin  Essential hypertension (controlled) Continue to monitor BP No antihypertensive medication noted on med rec, we shall await updated med rec   GERD Continue Protonix   Mixed hyperlipidemia Continue Lipitor  DVT prophylaxis: Lovenox  Code Status: Full code  Family Communication: None at bedside  Disposition Plan:  Patient is from:                        home Anticipated DC to:                   SNF or family members home Anticipated DC date:               2-3 days Anticipated DC barriers:          Patient requires inpatient management due to requiring BiPAP secondary to COPD suppression  Consults called: None  Admission status: Observation    Bernadette Hoit MD [facility]  10/27/2021, 6:34 AM

## 2021-10-27 NOTE — Progress Notes (Signed)
Patient admitted to room. Patient stated he had money (cash) in his briefs (underwear). There was NO cash anywhere on patient or in his underwear. Arlice Colt RN was in room with Probation officer and a witness. Patient made aware that no cash was found on him. Patient expressed full understanding. Spoke with ED RN Abe People) who stated they have no knowledge or seen any cash on patient.

## 2021-10-27 NOTE — ED Provider Notes (Signed)
[facility] EMERGENCY DEPARTMENT Provider Note   CSN: [id number] Arrival date & time: 10/27/21  0028     History Chief Complaint  Patient presents with   Shortness of Breath    Tony Sullivan is a 60 y.o. male.  Level 5 caveat for acuity of condition.  Patient brought in by EMS with respiratory distress and unresponsiveness.  EMS reports they received a call for shortness of breath.  On arrival patient was unresponsive with saturations in the 60s.  He was supposed to be on 4 L of nasal cannula oxygen.  He was placed on a nonrebreather and transported to the hospital.  Very diminished breath sounds bilaterally.  Patient more arousable on arrival and answering questions.  States he has had a "and "chest cold" for the past several days.  No fever.  Feels tight in his chest.  States compliance with his oxygen at home medications.  No abdominal pain or vomiting.  The history is provided by the patient and the EMS personnel. The history is limited by the condition of the patient.  Shortness of Breath     Past Medical History:  Diagnosis Date   Anxiety    Arthritis    COPD (chronic obstructive pulmonary disease) ([place]) 2004   diagnosed in 2004, no PFT's to date.  Started on home O2 12/2013, after found to be desatting at PCP's office, and referred to pulmonology.   Depression    GERD (gastroesophageal reflux disease)    High blood pressure    Prediabetes    Seasonal allergies    Tobacco dependence     Patient Active Problem List   Diagnosis Date Noted   Acute exacerbation of chronic obstructive pulmonary disease (COPD) ([place]) 10/02/2021   Hypophosphatemia 10/02/2021   Hypoalbuminemia due to protein-calorie malnutrition ([place]) 10/02/2021   Physical deconditioning 07/26/2021   Numbness and tingling in left arm 05/06/2021   Leukocytosis 05/05/2021   Severe headache 05/05/2021   Mixed hyperlipidemia 05/24/2020   Hypokalemia 05/24/2020   Chest pain 05/23/2020   Bipolar disorder  ([place]) 05/23/2020   HNP (herniated nucleus pulposus), cervical 09/06/2019   Abdominal pain    Poor dentition 11/04/2017   Acute on chronic respiratory failure with hypoxia ([place]) 08/29/2017   ETOH abuse 03/30/2017   Cigarette smoker 03/30/2017   Acute respiratory failure with hypoxia and hypercapnia ([facility]) 03/29/2017   Special screening for malignant neoplasms, colon    Benign neoplasm of ascending colon    Benign neoplasm of descending colon    Hoarseness 08/22/2016   GERD (gastroesophageal reflux disease) 08/22/2016   Atypical chest pain 12/05/2015   Essential hypertension 06/15/2014   Pectoralis muscle strain 01/11/2014   COPD with acute exacerbation ([place]) 01/10/2014   COPD  GOLD III 01/08/2014   Smoker 01/08/2014   Cough 01/08/2014   Chronic respiratory failure with hypoxia ([place]) 01/08/2014    Past Surgical History:  Procedure Laterality Date   ANTERIOR CERVICAL DECOMP/DISCECTOMY FUSION N/A 09/06/2019   Procedure: Anterior Cervical Discecectomy Fusion - Cerivcal Five-Cervical Six;  Surgeon: Kary Kos, MD;  Location: [place];  Service: Neurosurgery;  Laterality: N/A;  Anterior Cervical Discecectomy Fusion - Cerivcal Five-Cervical Six   ANTERIOR CERVICAL DISCECTOMY  09/06/2019   APPENDECTOMY  1980   COLONOSCOPY WITH PROPOFOL N/A 12/10/2016   Procedure: COLONOSCOPY WITH PROPOFOL;  Surgeon: [name], MD;  Location: [facility];  Service: Endoscopy;  Laterality: N/A;   HIP SURGERY Right    SHOULDER ARTHROSCOPY Right  Family History  Problem Relation Age of Onset   Emphysema Mother    Allergies Mother        "everyone in family"   Asthma Mother        "everyone in family"   Heart disease Mother    Clotting disorder Mother    Cancer Mother    Emphysema Father    Allergies Father    Allergies Son    Seizures Brother    Diabetes Brother     Social History   Tobacco Use   Smoking status: Some Days    Packs/day: 0.00    Years: 36.00    Pack years: 0.00     Types: Cigarettes   Smokeless tobacco: Never   Tobacco comments:    smokes 2 cigarettes per day 11/06/2020  Vaping Use   Vaping Use: Never used  Substance Use Topics   Alcohol use: Not Currently    Alcohol/week: 14.0 - 21.0 standard drinks    Types: 14 - 21 Standard drinks or equivalent per week    Comment: 2-3 beers/day, with occasional "binges".  No history of withdrawal   Drug use: Not Currently    Types: Marijuana, Cocaine    Comment: last cocaine use last month    Home Medications Prior to Admission medications   Medication Sig Start Date End Date Taking? Authorizing Provider  albuterol (PROVENTIL) (2.5 MG/3ML) 0.083% nebulizer solution Take 3 mLs (2.5 mg total) by nebulization every 4 (four) hours as needed for wheezing or shortness of breath. 10/02/21 10/02/22  [name], MD  albuterol (VENTOLIN HFA) 108 (90 Base) MCG/ACT inhaler Inhale 2 puffs into the lungs every 6 (six) hours as needed for wheezing or shortness of breath. 09/24/21   Fransico Meadow, PA-C  atorvastatin (LIPITOR) 40 MG tablet TAKE ONE TABLET BY MOUTH DAILY Patient taking differently: Take 40 mg by mouth daily. 07/06/20   [name], MD  azithromycin (ZITHROMAX Z-PAK) 250 MG tablet Take 1 tablet (250 mg total) by mouth daily. Take one tablet a day beginning 11/29 09/24/21   Fransico Meadow, PA-C  benztropine (COGENTIN) 0.5 MG tablet Take 0.5 mg by mouth at bedtime.     [provider]  Budeson-Glycopyrrol-Formoterol (BREZTRI AEROSPHERE) 160-9-4.8 MCG/ACT AERO Inhale 2 puffs into the lungs in the morning and at bedtime. 07/25/21   [name], NP  Budeson-Glycopyrrol-Formoterol (BREZTRI AEROSPHERE) 160-9-4.8 MCG/ACT AERO Inhale 2 puffs into the lungs in the morning and at bedtime. Patient not taking: Reported on 09/07/2021 07/25/21   [name], NP  cetirizine (ZYRTEC) 10 MG tablet Take 10 mg by mouth daily.    [provider]  dextromethorphan-guaiFENesin (MUCINEX DM) 30-600 MG  12hr tablet Take 1 tablet by mouth 2 (two) times daily. 10/02/21   [name], MD  diclofenac Sodium (VOLTAREN) 1 % GEL Apply 2 g topically 4 (four) times daily.    [provider]  dicyclomine (BENTYL) 20 MG tablet Take 1 tablet (20 mg total) by mouth 3 (three) times daily before meals. 10/17/21   Orpah Greek, MD  dorzolamide (TRUSOPT) 2 % ophthalmic solution Place 1 drop into the left eye 2 (two) times daily. 12/12/20   [provider]  famotidine (PEPCID) 20 MG tablet TAKE 1 TABLET (20 MG TOTAL) BY MOUTH AT BEDTIME. 02/05/21   [name], MD  feeding supplement, GLUCERNA SHAKE, (GLUCERNA SHAKE) LIQD Take 237 mLs by mouth 3 (three) times daily between meals. 10/02/21   Posey Pronto,  Josetta Huddle, MD  fluticasone (FLONASE) 50 MCG/ACT nasal spray Place 2 sprays into both nostrils 3 (three) times daily as needed for allergies.     [provider]  hydrochlorothiazide (HYDRODIURIL) 25 MG tablet Take 1 tablet (25 mg total) by mouth daily. 09/12/21   Tolia, Sunit, DO  latanoprost (XALATAN) 0.005 % ophthalmic solution Place 1 drop into both eyes at bedtime. 05/24/19   [provider]  loxapine (LOXITANE) 10 MG capsule Take 10 mg by mouth at bedtime.     [provider]  megestrol (MEGACE) 40 MG tablet Take 40 mg by mouth daily.    [provider]  mirtazapine (REMERON) 30 MG tablet Take 30 mg by mouth at bedtime.    [provider]  Multiple Vitamin (MULTIVITAMIN) capsule Take 1 capsule by mouth daily.    [provider]  oxyCODONE-acetaminophen (PERCOCET) 5-325 MG tablet Take 1-2 tablets by mouth every 4 (four) hours as needed. 10/17/21   Orpah Greek, MD  OXYGEN Inhale 3-4 L into the lungs as directed. 3-4 liters with sleep and exertion as needed for low oxygen    [provider]  pantoprazole (PROTONIX) 40 MG tablet Take 40 mg by mouth daily.    [provider]  polyethylene glycol (MIRALAX /  GLYCOLAX) 17 g packet TAKE 17 GRAMS BY MOUTH DAILY Patient taking differently: Take 17 g by mouth daily as needed for mild constipation. 03/09/20   [name], MD  predniSONE (DELTASONE) 10 MG tablet TAKE ONE TO TWO TABLETS BY MOUTH DAILY AS DIRECTED 10/05/21   [name], MD  predniSONE (DELTASONE) 10 MG tablet Take 50mg  daily for 3days,Take 40mg  daily for 3days,Take 30mg  daily for 3days,Take 20mg  daily for 3days,Take 10mg  daily for 3days, then stop 10/02/21   [name], MD  risperiDONE (RISPERDAL) 2 MG tablet Take 2 mg by mouth at bedtime.    [provider]  simethicone (MYLICON) 40 WJ/1.9JY drops Take 0.6 mLs (40 mg total) by mouth 4 (four) times daily as needed for flatulence. 10/17/21   Orpah Greek, MD    Allergies    Penicillins and Levocetirizine  Review of Systems   Review of Systems  Unable to perform ROS: Severe respiratory distress  Respiratory:  Positive for shortness of breath.    Physical Exam Updated Vital Signs BP (!) 162/106    Pulse 98    Resp (!) 24    SpO2 99%   Physical Exam Vitals and nursing note reviewed.  Constitutional:      General: He is in acute distress.     Appearance: He is well-developed. He is ill-appearing.     Comments: Obtunded, responds to voice, answers a few questions appropriately  HENT:     Head: Normocephalic and atraumatic.     Mouth/Throat:     Pharynx: No oropharyngeal exudate.  Eyes:     Conjunctiva/sclera: Conjunctivae normal.     Pupils: Pupils are equal, round, and reactive to light.  Neck:     Comments: No meningismus. Cardiovascular:     Rate and Rhythm: Normal rate and regular rhythm.     Heart sounds: Normal heart sounds. No murmur heard. Pulmonary:     Effort: Respiratory distress present.     Comments: Very diminished breath sounds bilaterally. Increased WOB with tachypnea Abdominal:     Palpations: Abdomen is soft.     Tenderness: There is no abdominal tenderness. There is no guarding  or rebound.  Genitourinary:  Comments: Incontinent of urine Musculoskeletal:        General: No tenderness. Normal range of motion.     Cervical back: Normal range of motion and neck supple.  Skin:    General: Skin is warm.  Neurological:     Mental Status: He is alert and oriented to person, place, and time.     Cranial Nerves: No cranial nerve deficit.     Motor: No abnormal muscle tone.     Coordination: Coordination normal.     Comments:  5/5 strength throughout. CN 2-12 intact.Equal grip strength.   Psychiatric:        Behavior: Behavior normal.    ED Results / Procedures / Treatments   Labs (all labs ordered are listed, but only abnormal results are displayed) Labs Reviewed  CBC WITH DIFFERENTIAL/PLATELET - Abnormal; Notable for the following components:      Result Value   WBC 12.4 (*)    MCV 100.6 (*)    Neutro Abs 10.0 (*)    Abs Immature Granulocytes 0.09 (*)    All other components within normal limits  BASIC METABOLIC PANEL - Abnormal; Notable for the following components:   Chloride 95 (*)    CO2 33 (*)    Glucose, Bld 138 (*)    BUN 21 (*)    All other components within normal limits  BLOOD GAS, ARTERIAL - Abnormal; Notable for the following components:   pH, Arterial 7.263 (*)    pCO2 arterial 78.0 (*)    pO2, Arterial 277 (*)    Bicarbonate 28.2 (*)    Acid-Base Excess 7.2 (*)    All other components within normal limits  BLOOD GAS, ARTERIAL - Abnormal; Notable for the following components:   pH, Arterial 7.317 (*)    pCO2 arterial 69.7 (*)    pO2, Arterial 139 (*)    Bicarbonate 30.1 (*)    Acid-Base Excess 8.5 (*)    All other components within normal limits  TROPONIN I (HIGH SENSITIVITY) - Abnormal; Notable for the following components:   Troponin I (High Sensitivity) 20 (*)    All other components within normal limits  TROPONIN I (HIGH SENSITIVITY) - Abnormal; Notable for the following components:   Troponin I (High Sensitivity) 48 (*)     All other components within normal limits  RESP PANEL BY RT-PCR (FLU A&B, COVID) ARPGX2  BRAIN NATRIURETIC PEPTIDE  D-DIMER, QUANTITATIVE    EKG EKG Interpretation  Date/Time:  Saturday October 27 2021 01:39:49 EST Ventricular Rate:  104 PR Interval:  152 QRS Duration: 79 QT Interval:  344 QTC Calculation: 453 R Axis:   265 Text Interpretation: Sinus tachycardia Atrial premature complex Right atrial enlargement Left anterior fascicular block Anterior infarct, old No significant change was found Confirmed by Ezequiel Essex [phone]) on 10/27/2021 1:43:32 AM  Radiology DG Chest Portable 1 View  Result Date: 10/27/2021 CLINICAL DATA:  Dyspnea, COPD EXAM: PORTABLE CHEST 1 VIEW COMPARISON:  10/17/2021 FINDINGS: Stable pulmonary hyperinflation. Paucity of vasculature within the lung apices in keeping with underlying bullous emphysema. No focal pulmonary nodule or infiltrate. Healed right posterior sixth rib fracture again noted. No pneumothorax or pleural effusion. Cardiac size within normal limits. No acute bone abnormality. IMPRESSION: No active disease.  Emphysema with stable pulmonary hyperinflation. Electronically Signed   By: Fidela Salisbury M.D.   On: 10/27/2021 01:12    Procedures .Critical Care Performed by: Ezequiel Essex, MD Authorized by: Ezequiel Essex, MD   Critical care provider statement:  Critical care time (minutes):  45   Critical care time was exclusive of:  Separately billable procedures and treating other patients   Critical care was necessary to treat or prevent imminent or life-threatening deterioration of the following conditions:  Respiratory failure   Critical care was time spent personally by me on the following activities:  Development of treatment plan with patient or surrogate, discussions with consultants, evaluation of patient's response to treatment, examination of patient, ordering and review of laboratory studies, ordering and review of  radiographic studies, ordering and performing treatments and interventions, pulse oximetry, re-evaluation of patient's condition and review of old charts   I assumed direction of critical care for this patient from another provider in my specialty: no     Care discussed with: admitting provider     Medications Ordered in ED Medications  albuterol (PROVENTIL,VENTOLIN) solution continuous neb (has no administration in time range)  ipratropium (ATROVENT) nebulizer solution 0.5 mg (has no administration in time range)  methylPREDNISolone sodium succinate (SOLU-MEDROL) 125 mg/2 mL injection 125 mg (has no administration in time range)  magnesium sulfate IVPB 2 g 50 mL (has no administration in time range)  albuterol (PROVENTIL) (2.5 MG/3ML) 0.083% nebulizer solution (has no administration in time range)    ED Course  I have reviewed the triage vital signs and the nursing notes.  Pertinent labs & imaging results that were available during my care of the patient were reviewed by me and considered in my medical decision making (see chart for details).    MDM Rules/Calculators/A&P                         Patient unresponsive with respiratory distress but becoming more arousable on arrival.  Very diminished breath sounds with minimal air exchange.  Transition to BiPAP on arrival.  Initiated on bronchodilators, steroids and magnesium.  Will obtain labs including ABG  ABG shows respiratory acidosis with CO2 retention.  Upon minimally elevated.  Patient denies any chest pain.  He is tolerating BiPAP well states he is breathing more comfortably.  No changes improving after continuous nebulizer, steroids and magnesium.  Chest x-ray does not show any infiltrate.  COVID and flu testing are negative.  CO2 retention improving on ABG  Suspect likely hypercarbic respiratory failure in setting of underlying COPD.  Patient tolerating BiPAP and mentation is improving.  We will hold on intubation at this  time. D-dimer is negative.  Low suspicion for pulmonary embolism aortic dissection.  Admission discussed with Dr. Josephine Cables.     Final Clinical Impression(s) / ED Diagnoses Final diagnoses:  Acute respiratory failure with hypercapnia [facility])    Rx / DC Orders ED Discharge Orders     None        Johnthomas Lader, Annie Main, MD 10/27/21 [phone]

## 2021-10-27 NOTE — Progress Notes (Signed)
°  Echocardiogram 2D Echocardiogram has been performed.  Tony Sullivan 10/27/2021, 3:16 PM

## 2021-10-28 DIAGNOSIS — J441 Chronic obstructive pulmonary disease with (acute) exacerbation: Secondary | ICD-10-CM | POA: Diagnosis not present

## 2021-10-28 LAB — CBC
HCT: 44.2 % (ref 39.0–52.0)
Hemoglobin: 14 g/dL (ref 13.0–17.0)
MCH: 31.9 pg (ref 26.0–34.0)
MCHC: 31.7 g/dL (ref 30.0–36.0)
MCV: 100.7 fL — ABNORMAL HIGH (ref 80.0–100.0)
Platelets: 223 10*3/uL (ref 150–400)
RBC: 4.39 MIL/uL (ref 4.22–5.81)
RDW: 14.4 % (ref 11.5–15.5)
WBC: 12.2 10*3/uL — ABNORMAL HIGH (ref 4.0–10.5)
nRBC: 0 % (ref 0.0–0.2)

## 2021-10-28 LAB — COMPREHENSIVE METABOLIC PANEL
ALT: 29 U/L (ref 0–44)
AST: 30 U/L (ref 15–41)
Albumin: 3.1 g/dL — ABNORMAL LOW (ref 3.5–5.0)
Alkaline Phosphatase: 52 U/L (ref 38–126)
Anion gap: 9 (ref 5–15)
BUN: 32 mg/dL — ABNORMAL HIGH (ref 6–20)
CO2: 34 mmol/L — ABNORMAL HIGH (ref 22–32)
Calcium: 8.8 mg/dL — ABNORMAL LOW (ref 8.9–10.3)
Chloride: 97 mmol/L — ABNORMAL LOW (ref 98–111)
Creatinine, Ser: 0.89 mg/dL (ref 0.61–1.24)
GFR, Estimated: >60 mL/min (ref >60)
Glucose, Bld: 147 mg/dL — ABNORMAL HIGH (ref 70–99)
Potassium: 4.6 mmol/L (ref 3.5–5.1)
Sodium: 140 mmol/L (ref 135–145)
Total Bilirubin: 1.3 mg/dL — ABNORMAL HIGH (ref 0.3–1.2)
Total Protein: 6.3 g/dL — ABNORMAL LOW (ref 6.5–8.1)

## 2021-10-28 LAB — APTT: aPTT: 26 seconds (ref 24–36)

## 2021-10-28 MED ORDER — PANTOPRAZOLE SODIUM 40 MG PO TBEC: 40.0000 mg | DELAYED_RELEASE_TABLET | Freq: Every day | ORAL | Status: DC

## 2021-10-28 MED ORDER — LATANOPROST 0.005 % OP SOLN
1.0000 [drp] | Freq: Every day | OPHTHALMIC | Status: DC
  Administered 2021-10-28 – 2021-10-29 (×2): 1 [drp] via OPHTHALMIC
  Filled 2021-10-28 (×2): qty 2.5

## 2021-10-28 MED ORDER — RISPERIDONE 1 MG PO TABS
2.0000 mg | ORAL_TABLET | Freq: Every day | ORAL | Status: DC
  Filled 2021-10-28 (×2): qty 2

## 2021-10-28 MED ORDER — BENZTROPINE MESYLATE 1 MG PO TABS
0.5000 mg | ORAL_TABLET | Freq: Every day | ORAL | Status: DC
  Administered 2021-10-28: 0.5 mg via ORAL
  Filled 2021-10-28 (×2): qty 1

## 2021-10-28 MED ORDER — ADULT MULTIVITAMIN W/MINERALS CH
1.0000 | ORAL_TABLET | Freq: Every day | ORAL | Status: DC
  Administered 2021-10-28 – 2021-10-30 (×3): 1 via ORAL
  Filled 2021-10-28 (×4): qty 1

## 2021-10-28 MED ORDER — DORZOLAMIDE HCL 2 % OP SOLN
1.0000 [drp] | Freq: Two times a day (BID) | OPHTHALMIC | Status: DC
  Administered 2021-10-28 – 2021-10-30 (×4): 1 [drp] via OPHTHALMIC
  Filled 2021-10-28: qty 10

## 2021-10-28 MED ORDER — MIRTAZAPINE 30 MG PO TABS
30.0000 mg | ORAL_TABLET | Freq: Every day | ORAL | Status: DC
  Filled 2021-10-28 (×2): qty 1

## 2021-10-28 NOTE — Progress Notes (Signed)
Called dept [phone] & report given to receiving nurse Caryl Pina, patient transferring to room# [id number]

## 2021-10-28 NOTE — Progress Notes (Signed)
PROGRESS NOTE     Tony Sullivan, is a 61 y.o. male, DOB - 09-01-1961, IHW:[id number]  Admit date - 10/27/2021   Admitting Physician Audie Wieser Denton Brick, MD  Outpatient Primary MD for the patient is Tony Drafts, FNP  LOS - 1  Chief Complaint  Patient presents with   Shortness of Breath        Brief Narrative:  61 y.o. male with medical history significant for COPD on 3-4 LPM of home oxygen, GERD, hypertension, anxiety, osteoarthritis, tobacco and cocaine use and history of cervical fusion admitted on 10/26/2021 with acute on chronic hypoxic and hypercapnic respiratory failure with uncompensated respiratory acidosis - Per report, EMS was activated due to shortness of breath, on arrival of EMS team, patient was unresponsive, O2 sat was noted to be in the 60s, he was placed on a NRB and was taken to the ED for further evaluation and management  Assessment & Plan:   Principal Problem:   COPD with acute exacerbation ([place]) Active Problems:   Acute on chronic respiratory failure with hypoxia and hypercapnia (HCC)   Leukocytosis   Elevated troponin   Hyperglycemia   Elevated MCV   Respiratory failure with hypoxia and hypercapnia (HCC)   1)Acute COPD Exacerbation- no definite pneumonia,   -c/n  IV Solu-Medrol 40 mg every 12 hours, give mucolytics, azithromycin and bronchodilators as ordered, supplemental oxygen as ordered.  10/28/21 -Cough, wheezing, dyspnea and hypoxia persist--desaturates further with minimal activity  2) acute on chronic hypoxic and hypercapnic respiratory failure--secondary to #1 above -Patient required prolonged BiPAP use initially -ABG showed pH of 7.26 with PCO2 of 17.2-70 7 repeat ABG with pH of 7.31 PCO2 of 69.7 -Continue BiPAP nightly and as needed -Treat as above #1 -At baseline patient supposed to be on 3L/min of oxygen at Home admits to noncompliance due to need to smoke tobacco -Initially on arrival via EMS apparently patient's O2 sats were in the  60s, required continuous BiPAP  3) elevated troponin--- due to demand ischemia in the setting of respiratory failure with hypoxia -Troponin 20>>48>>100>>92 >>99 -EKG nonacute -Echo with EF of 55 to 60%, no wall motion normalities, grade 1 diastolic dysfunction noted  4)HFpEF--- echo as above #3 --BNP 82, chest x-ray without pulmonary edema -Appears compensated from CHF standpoint,  5)GERD--- Protonix especially while on steroid   6)HTN--Amlodipine 10 mg daily,   may use IV Hydralazine 10 mg  Every 4 hours Prn for systolic blood pressure over 170 mmhg  7) social/ethics--- patient is a full code  8) crack cocaine use--- patient admits to using crack cocaine frequently including within 24 hours of being admitted at this time around -Outpatient rehab programs advised  9)--tobacco abuse--- smoking cessation advised May use nicotine patch  -   Disposition/Need for in-Hospital Stay- patient unable to be discharged at this time due to --hypoxic respiratory failure with hypercapnia and uncompensated respiratory acidosis--- requiring BiPAP, IV steroids and supplemental oxygen  Status is: Inpatient  Remains inpatient appropriate because:   Disposition: The patient is from: Home              Anticipated d/c is to: Home              Anticipated d/c date is: 1 day              Patient currently is not medically stable to d/c. Barriers: Not Clinically Stable-   Code Status :  -  Code Status: Full Code   Family Communication:  NA (patient is alert, awake and coherent)   Consults  :    DVT Prophylaxis  :   - SCDs  enoxaparin (LOVENOX) injection 40 mg Start: 10/27/21 0800 SCDs Start: 10/27/21 6578    Lab Results  Component Value Date   PLT 223 10/28/2021    Inpatient Medications  Scheduled Meds:  albuterol  10 mg/hr Nebulization Once   amLODipine  10 mg Oral Daily   atorvastatin  40 mg Oral Daily   azithromycin  250 mg Oral Daily   benztropine  0.5 mg Oral QHS    Chlorhexidine Gluconate Cloth  6 each Topical Daily   dextromethorphan-guaiFENesin  1 tablet Oral BID   dorzolamide  1 drop Left Eye BID   enoxaparin (LOVENOX) injection  40 mg Subcutaneous Q24H   ipratropium-albuterol  3 mL Nebulization Q6H   latanoprost  1 drop Both Eyes QHS   mouth rinse  15 mL Mouth Rinse BID   methylPREDNISolone (SOLU-MEDROL) injection  40 mg Intravenous Q12H   mirtazapine  30 mg Oral QHS   mometasone-formoterol  2 puff Inhalation BID   multivitamin with minerals  1 tablet Oral Daily   pantoprazole  40 mg Oral Daily   risperiDONE  2 mg Oral QHS   senna-docusate  2 tablet Oral QHS   Continuous Infusions: PRN Meds:.acetaminophen **OR** acetaminophen, albuterol, hydrALAZINE, hydrOXYzine   Anti-infectives (From admission, onward)    Start     Dose/Rate Route Frequency Ordered Stop   10/28/21 1000  azithromycin (ZITHROMAX) tablet 250 mg       See Hyperspace for full Linked Orders Report.   250 mg Oral Daily 10/27/21 0640 11/01/21 0959   10/27/21 1000  azithromycin (ZITHROMAX) tablet 500 mg       See Hyperspace for full Linked Orders Report.   500 mg Oral Daily 10/27/21 0640 10/27/21 0914         Subjective: Gwenyth Bouillon today has no fevers, no emesis,  No chest pain,   No fever  Or chills   -Cough, wheezing, dyspnea and hypoxia persist--desaturates further with minimal activity  -Patient states he is worried about being discharged home prior to being ready - Became very agitated and used colorful language while expressing his desire not to be discharged soon  Objective: Vitals:   10/28/21 1159 10/28/21 1200 10/28/21 1300 10/28/21 1400  BP:  (!) 135/109 (!) 188/107 99/61  Pulse:  68 76 68  Resp:  (!) 23 14 18   Temp: 98.4 F (36.9 C)     TempSrc: Oral     SpO2:  91% 93% 92%  Weight:      Height:        Intake/Output Summary (Last 24 hours) at 10/28/2021 1901 Last data filed at 10/28/2021 1507 Gross per 24 hour  Intake 100 ml  Output 650 ml  Net  -550 ml   Filed Weights   10/27/21 0920  Weight: 65.6 kg    Physical Exam  Gen:- Awake Alert, dyspnea on exertion, no further conversational dyspnea  HEENT:- [place].AT, No sclera icterus NOse-[place] 4L/min Neck-Supple Neck,No JVD,.  Lungs-diminished breath sounds with scattered wheezing expiratory CV- S1, S2 normal, regular  Abd-  +ve B.Sounds, Abd Soft, No tenderness,    Extremity/Skin:- No  edema, pedal pulses present  Psych-affect is anxious,, oriented x3 Neuro-no new focal deficits, no tremors  Data Reviewed: I have personally reviewed following labs and imaging studies  CBC: Recent Labs  Lab 10/27/21 0032 10/28/21 0447  WBC 12.4* 12.2*  NEUTROABS 10.0*  --   HGB 15.3 14.0  HCT 47.2 44.2  MCV 100.6* 100.7*  PLT 249 474   Basic Metabolic Panel: Recent Labs  Lab 10/27/21 0032 10/27/21 0728 10/28/21 0447  NA 139  --  140  K 3.7  --  4.6  CL 95*  --  97*  CO2 33*  --  34*  GLUCOSE 138*  --  147*  BUN 21*  --  32*  CREATININE 0.98  --  0.89  CALCIUM 9.0  --  8.8*  MG  --  3.0*  --   PHOS  --  4.0  --    GFR: Estimated Creatinine Clearance: 83.9 mL/min (by C-G formula based on SCr of 0.89 mg/dL). Liver Function Tests: Recent Labs  Lab 10/28/21 0447  AST 30  ALT 29  ALKPHOS 52  BILITOT 1.3*  PROT 6.3*  ALBUMIN 3.1*   No results for input(s): LIPASE, AMYLASE in the last 168 hours. No results for input(s): AMMONIA in the last 168 hours. Coagulation Profile: No results for input(s): INR, PROTIME in the last 168 hours. Cardiac Enzymes: No results for input(s): CKTOTAL, CKMB, CKMBINDEX, TROPONINI in the last 168 hours. BNP (last 3 results) No results for input(s): PROBNP in the last 8760 hours. HbA1C: No results for input(s): HGBA1C in the last 72 hours. CBG: No results for input(s): GLUCAP in the last 168 hours. Lipid Profile: No results for input(s): CHOL, HDL, LDLCALC, TRIG, CHOLHDL, LDLDIRECT in the last 72 hours. Thyroid Function Tests: No results  for input(s): TSH, T4TOTAL, FREET4, T3FREE, THYROIDAB in the last 72 hours. Anemia Panel: Recent Labs    10/27/21 0728  VITAMINB12 733  FOLATE 21.5   Urine analysis:    Component Value Date/Time   COLORURINE YELLOW 10/16/2021 0130   APPEARANCEUR CLEAR 10/16/2021 0130   LABSPEC 1.020 10/16/2021 0130   PHURINE 6.0 10/16/2021 0130   GLUCOSEU NEGATIVE 10/16/2021 0130   HGBUR TRACE (A) 10/16/2021 0130   BILIRUBINUR NEGATIVE 10/16/2021 0130   KETONESUR NEGATIVE 10/16/2021 0130   PROTEINUR NEGATIVE 10/16/2021 0130   UROBILINOGEN 1.0 06/12/2015 1021   NITRITE NEGATIVE 10/16/2021 0130   LEUKOCYTESUR NEGATIVE 10/16/2021 0130   Sepsis Labs: @LABRCNTIP (procalcitonin:4,lacticidven:4)  ) Recent Results (from the past 240 hour(s))  Resp Panel by RT-PCR (Flu A&B, Covid) Nasopharyngeal Swab     Status: None   Collection Time: 10/27/21 12:43 AM   Specimen: Nasopharyngeal Swab; Nasopharyngeal(NP) swabs in vial transport medium  Result Value Ref Range Status   SARS Coronavirus 2 by RT PCR NEGATIVE NEGATIVE Final    Comment: (NOTE) SARS-CoV-2 target nucleic acids are NOT DETECTED.  The SARS-CoV-2 RNA is generally detectable in upper respiratory specimens during the acute phase of infection. The lowest concentration of SARS-CoV-2 viral copies this assay can detect is 138 copies/mL. A negative result does not preclude SARS-Cov-2 infection and should not be used as the sole basis for treatment or other patient management decisions. A negative result may occur with  improper specimen collection/handling, submission of specimen other than nasopharyngeal swab, presence of viral mutation(s) within the areas targeted by this assay, and inadequate number of viral copies(<138 copies/mL). A negative result must be combined with clinical observations, patient history, and epidemiological information. The expected result is Negative.  Fact Sheet for Patients:   EntrepreneurPulse.com.au  Fact Sheet for Healthcare Providers:  IncredibleEmployment.be  This test is no t yet approved or cleared by the [place] FDA and  has been authorized for detection and/or diagnosis  of SARS-CoV-2 by FDA under an Emergency Use Authorization (EUA). This EUA will remain  in effect (meaning this test can be used) for the duration of the COVID-19 declaration under Section 564(b)(1) of the Act, 21 U.S.C.section 360bbb-3(b)(1), unless the authorization is terminated  or revoked sooner.       Influenza A by PCR NEGATIVE NEGATIVE Final   Influenza B by PCR NEGATIVE NEGATIVE Final    Comment: (NOTE) The Xpert Xpress SARS-CoV-2/FLU/RSV plus assay is intended as an aid in the diagnosis of influenza from Nasopharyngeal swab specimens and should not be used as a sole basis for treatment. Nasal washings and aspirates are unacceptable for Xpert Xpress SARS-CoV-2/FLU/RSV testing.  Fact Sheet for Patients: EntrepreneurPulse.com.au  Fact Sheet for Healthcare Providers: IncredibleEmployment.be  This test is not yet approved or cleared by the [place] FDA and has been authorized for detection and/or diagnosis of SARS-CoV-2 by FDA under an Emergency Use Authorization (EUA). This EUA will remain in effect (meaning this test can be used) for the duration of the COVID-19 declaration under Section 564(b)(1) of the Act, 21 U.S.C. section 360bbb-3(b)(1), unless the authorization is terminated or revoked.  Performed at [facility], [address], [place], [place] [address]   MRSA Next Gen by PCR, Nasal     Status: None   Collection Time: 10/27/21  9:27 AM   Specimen: Nasal Mucosa; Nasal Swab  Result Value Ref Range Status   MRSA by PCR Next Gen NOT DETECTED NOT DETECTED Final    Comment: (NOTE) The GeneXpert MRSA Assay (FDA approved for NASAL specimens only), is one component of a  comprehensive MRSA colonization surveillance program. It is not intended to diagnose MRSA infection nor to guide or monitor treatment for MRSA infections. Test performance is not FDA approved in patients less than 66 years old. Performed at [facility], [address] [place], [place] [address]       Radiology Studies: CT Head Wo Contrast  Result Date: 10/27/2021 CLINICAL DATA:  Mental status change. EXAM: CT HEAD WITHOUT CONTRAST TECHNIQUE: Contiguous axial images were obtained from the base of the skull through the vertex without intravenous contrast. COMPARISON:  Brain MRI 05/06/2021 FINDINGS: Brain: No evidence of acute infarction, hemorrhage, hydrocephalus, extra-axial collection or mass lesion/mass effect. There is mild diffuse low-attenuation within the subcortical and periventricular white matter compatible with chronic microvascular disease. Vascular: No hyperdense vessel or unexpected calcification. Skull: Normal. Negative for fracture or focal lesion. Sinuses/Orbits: No acute finding. Other: None. IMPRESSION: 1. No acute intracranial abnormalities. 2. Mild chronic small vessel ischemic change. Electronically Signed   By: Kerby Moors M.D.   On: 10/27/2021 09:34   DG Chest Portable 1 View  Result Date: 10/27/2021 CLINICAL DATA:  Dyspnea, COPD EXAM: PORTABLE CHEST 1 VIEW COMPARISON:  10/17/2021 FINDINGS: Stable pulmonary hyperinflation. Paucity of vasculature within the lung apices in keeping with underlying bullous emphysema. No focal pulmonary nodule or infiltrate. Healed right posterior sixth rib fracture again noted. No pneumothorax or pleural effusion. Cardiac size within normal limits. No acute bone abnormality. IMPRESSION: No active disease.  Emphysema with stable pulmonary hyperinflation. Electronically Signed   By: Fidela Salisbury M.D.   On: 10/27/2021 01:12   ECHOCARDIOGRAM COMPLETE  Result Date: 10/27/2021    ECHOCARDIOGRAM REPORT   Patient Name:   Tony Sullivan Date of  Exam: 10/27/2021 Medical Rec #:  [id number]      Height:       71.0 in Accession #:    [id number]  Weight:       144.6 lb Date of Birth:  04/28/1961       BSA:          1.837 m Patient Age:    19 years       BP:           120/71 mmHg Patient Gender: M              HR:           63 bpm. Exam Location:  [facility] Procedure: 2D Echo, Cardiac Doppler and Color Doppler Indications:    Dyspnea  History:        Patient has prior history of Echocardiogram examinations, most                 recent 05/05/2021. COPD; Risk Factors:Current Smoker and                 Hypertension.  Sonographer:    Bernadene Person RDCS Referring Phys: [place]  1. Left ventricular ejection fraction, by estimation, is 55 to 60%. The left ventricle has normal function. The left ventricle has no regional wall motion abnormalities. Left ventricular diastolic parameters are consistent with Grade I diastolic dysfunction (impaired relaxation).  2. Right ventricular systolic function is normal. The right ventricular size is normal. There is normal pulmonary artery systolic pressure. The estimated right ventricular systolic pressure is 81.1 mmHg.  3. The mitral valve is grossly normal. No evidence of mitral valve regurgitation.  4. The aortic valve is tricuspid. Aortic valve regurgitation is not visualized.  5. The inferior vena cava is dilated in size with >50% respiratory variability, suggesting right atrial pressure of 8 mmHg. Comparison(s): Changes from prior study are noted. 05/05/2021: LVEF 60-65%, grade 1 DD. FINDINGS  Left Ventricle: Left ventricular ejection fraction, by estimation, is 55 to 60%. The left ventricle has normal function. The left ventricle has no regional wall motion abnormalities. The left ventricular internal cavity size was normal in size. There is  no left ventricular hypertrophy. Left ventricular diastolic parameters are consistent with Grade I diastolic dysfunction (impaired relaxation). Indeterminate  filling pressures. Right Ventricle: The right ventricular size is normal. No increase in right ventricular wall thickness. Right ventricular systolic function is normal. There is normal pulmonary artery systolic pressure. The tricuspid regurgitant velocity is 2.48 m/s, and  with an assumed right atrial pressure of 8 mmHg, the estimated right ventricular systolic pressure is 91.4 mmHg. Left Atrium: Left atrial size was normal in size. Right Atrium: Right atrial size was normal in size. Pericardium: There is no evidence of pericardial effusion. Mitral Valve: The mitral valve is grossly normal. No evidence of mitral valve regurgitation. Tricuspid Valve: The tricuspid valve is grossly normal. Tricuspid valve regurgitation is trivial. Aortic Valve: The aortic valve is tricuspid. Aortic valve regurgitation is not visualized. Pulmonic Valve: The pulmonic valve was normal in structure. Pulmonic valve regurgitation is not visualized. Aorta: The aortic root and ascending aorta are structurally normal, with no evidence of dilitation. Venous: The inferior vena cava is dilated in size with greater than 50% respiratory variability, suggesting right atrial pressure of 8 mmHg. IAS/Shunts: No atrial level shunt detected by color flow Doppler.  LEFT VENTRICLE PLAX 2D LVIDd:         4.90 cm      Diastology LVIDs:         3.00 cm      LV e' medial:    7.78 cm/s LV  PW:         0.80 cm      LV E/e' medial:  11.0 LV IVS:        0.80 cm      LV e' lateral:   6.89 cm/s LVOT diam:     2.00 cm      LV E/e' lateral: 12.4 LV SV:         53 LV SV Index:   29 LVOT Area:     3.14 cm  LV Volumes (MOD) LV vol d, MOD A2C: 110.0 ml LV vol d, MOD A4C: 117.0 ml LV vol s, MOD A2C: 49.4 ml LV vol s, MOD A4C: 47.1 ml LV SV MOD A2C:     60.6 ml LV SV MOD A4C:     117.0 ml LV SV MOD BP:      64.3 ml RIGHT VENTRICLE RV S prime:     11.90 cm/s TAPSE (M-mode): 2.2 cm LEFT ATRIUM           Index        RIGHT ATRIUM           Index LA diam:      3.10 cm 1.69  cm/m   RA Area:     13.10 cm LA Vol (A2C): 35.2 ml 19.16 ml/m  RA Volume:   32.20 ml  17.53 ml/m LA Vol (A4C): 26.4 ml 14.37 ml/m  AORTIC VALVE LVOT Vmax:   97.90 cm/s LVOT Vmean:  62.350 cm/s LVOT VTI:    0.168 m  AORTA Ao Root diam: 3.60 cm Ao Asc diam:  3.50 cm MITRAL VALVE                TRICUSPID VALVE MV Area (PHT): 3.85 cm     TR Peak grad:   24.6 mmHg MV Decel Time: 197 msec     TR Vmax:        248.00 cm/s MV E velocity: 85.30 cm/s MV A velocity: 119.00 cm/s  SHUNTS MV E/A ratio:  0.72         Systemic VTI:  0.17 m                             Systemic Diam: 2.00 cm Lyman Bishop MD Electronically signed by Lyman Bishop MD Signature Date/Time: 10/27/2021/3:23:50 PM    Final      Scheduled Meds:  albuterol  10 mg/hr Nebulization Once   amLODipine  10 mg Oral Daily   atorvastatin  40 mg Oral Daily   azithromycin  250 mg Oral Daily   benztropine  0.5 mg Oral QHS   Chlorhexidine Gluconate Cloth  6 each Topical Daily   dextromethorphan-guaiFENesin  1 tablet Oral BID   dorzolamide  1 drop Left Eye BID   enoxaparin (LOVENOX) injection  40 mg Subcutaneous Q24H   ipratropium-albuterol  3 mL Nebulization Q6H   latanoprost  1 drop Both Eyes QHS   mouth rinse  15 mL Mouth Rinse BID   methylPREDNISolone (SOLU-MEDROL) injection  40 mg Intravenous Q12H   mirtazapine  30 mg Oral QHS   mometasone-formoterol  2 puff Inhalation BID   multivitamin with minerals  1 tablet Oral Daily   pantoprazole  40 mg Oral Daily   risperiDONE  2 mg Oral QHS   senna-docusate  2 tablet Oral QHS   Continuous Infusions:   LOS: 1 day    [name] M.D on 10/28/2021 at 7:01  PM  Go to [email or web address] - for contact info  [facility] - Office  [phone]  If 7PM-7AM, please contact night-coverage [email or web address] Password [email or web address] 10/28/2021, 7:01 PM

## 2021-10-28 NOTE — Progress Notes (Signed)
Patient refused to take Cogentin, Risperdal, Remeron. pt complaining of "chest discomfort and tightness", respiritory administered nebulizer and went to put his bipap on. Patient  had it on then took it off he currently has his o2 on and vitals are normal, lungs sound good. He was beginning to get anxious and worked up and stating "I can't rest." Gave him the PRN Hydroxyzine to help him calm down. MD notified.

## 2021-10-29 DIAGNOSIS — J441 Chronic obstructive pulmonary disease with (acute) exacerbation: Secondary | ICD-10-CM | POA: Diagnosis not present

## 2021-10-29 LAB — RENAL FUNCTION PANEL
Albumin: 2.9 g/dL — ABNORMAL LOW (ref 3.5–5.0)
Anion gap: 8 (ref 5–15)
BUN: 25 mg/dL — ABNORMAL HIGH (ref 6–20)
CO2: 32 mmol/L (ref 22–32)
Calcium: 8.8 mg/dL — ABNORMAL LOW (ref 8.9–10.3)
Chloride: 100 mmol/L (ref 98–111)
Creatinine, Ser: 0.87 mg/dL (ref 0.61–1.24)
GFR, Estimated: >60 mL/min (ref >60)
Glucose, Bld: 130 mg/dL — ABNORMAL HIGH (ref 70–99)
Phosphorus: 2.1 mg/dL — ABNORMAL LOW (ref 2.5–4.6)
Potassium: 3.5 mmol/L (ref 3.5–5.1)
Sodium: 140 mmol/L (ref 135–145)

## 2021-10-29 MED ORDER — K PHOS MONO-SOD PHOS DI & MONO 155-852-130 MG PO TABS
250.0000 mg | ORAL_TABLET | Freq: Three times a day (TID) | ORAL | Status: DC
  Administered 2021-10-29 – 2021-10-30 (×4): 250 mg via ORAL
  Filled 2021-10-29 (×4): qty 1

## 2021-10-29 MED ORDER — PHENOL 1.4 % MT LIQD
1.0000 | OROMUCOSAL | Status: DC | PRN
  Administered 2021-10-29: 1 via OROMUCOSAL
  Filled 2021-10-29: qty 177

## 2021-10-29 NOTE — Evaluation (Signed)
Physical Therapy Evaluation Patient Details Name: Tony Sullivan MRN: [id number] DOB: Aug 03, 1961 Today's Date: 10/29/2021  History of Present Illness  Tony Sullivan is a 61 y.o. male with medical history significant for COPD on 3-4 LPM of home oxygen, GERD, hypertension, anxiety, osteoarthritis, tobacco and cocaine use and history of cervical fusion who presents to the emergency department due to shortness of breath.  Patient was unable to provide history possibly due to being in acute distress.  History was obtained from ED physician and ED medical record.  Per report, EMS was activated due to shortness of breath, on arrival of EMS team, patient was unresponsive, O2 sat was noted to be in the 60s, he was placed on a NRB and was taken to the ED for further evaluation and management.  Patient was reported to be more arousable by the time he arrived at the ED and was able to answer some questions, patient stated that the had chest cold for a few days and that this chest felt tight, he denied fever, vomiting, abdominal pain.   Clinical Impression   Pt demonstrates decline in functional mobility and independence requiring increased need for assistance with transfers and demonstrating increased SOB/DOE with mobility demo 89-93% sat rate with supplemental O2.  Patient reports he is non-ambulatory at baseline and typically performs functional transfers to/from power chair and has [place] assistance 3 hr/day x 3-7 days/wk.  Patient requiring increased physical assistance with transfers at this time due to LE weakness and would benefit from continued PT services while hospitalized and recommend [facility] services for transition to home environment to facilitate return to PLOF       Recommendations for follow up therapy are one component of a multi-disciplinary discharge planning process, led by the attending physician.  Recommendations may be updated based on patient status, additional  functional criteria and insurance authorization.  Follow Up Recommendations Home health PT    Assistance Recommended at Discharge Intermittent Supervision/Assistance  Functional Status Assessment Patient has had a recent decline in their functional status and demonstrates the ability to make significant improvements in function in a reasonable and predictable amount of time.  Equipment Recommendations  None recommended by PT    Recommendations for Other Services       Precautions / Restrictions Restrictions Weight Bearing Restrictions: No      Mobility  Bed Mobility Overal bed mobility: Independent               Patient Response: Cooperative;Anxious  Transfers Overall transfer level: Needs assistance   Transfers: Bed to chair/wheelchair/BSC   Stand pivot transfers: Min assist              Ambulation/Gait                  Stairs            Wheelchair Mobility    Modified Rankin (Stroke Patients Only)       Balance Overall balance assessment: Mild deficits observed, not formally tested                                           Pertinent Vitals/Pain Pain Assessment: No/denies pain    Home Living Family/patient expects to be discharged to:: Private residence Living Arrangements: Alone Available Help at Discharge: Personal care attendant Type of Home: House Home Access: Ramped entrance  Home Layout: Able to live on main level with bedroom/bathroom Home Equipment: Rolling Walker (2 wheels);Cane - single point;Wheelchair - power Additional Comments: aide available 3hr/day 7 days/week    Prior Function Prior Level of Function : Needs assist       Physical Assist : Mobility (physical);ADLs (physical) Mobility (physical): Transfers ADLs (physical): Bathing;Dressing;Toileting;IADLs Mobility Comments: pt reports he typically performs transfers to/from power chair, does not ambulate       Hand Dominance    Dominant Hand: Right    Extremity/Trunk Assessment   Upper Extremity Assessment Upper Extremity Assessment: Defer to OT evaluation    Lower Extremity Assessment Lower Extremity Assessment: Generalized weakness    Cervical / Trunk Assessment Cervical / Trunk Assessment: Normal  Communication   Communication: No difficulties  Cognition Arousal/Alertness: Awake/alert Behavior During Therapy: WFL for tasks assessed/performed;Anxious Overall Cognitive Status: Within Functional Limits for tasks assessed                                          General Comments      Exercises     Assessment/Plan    PT Assessment Patient needs continued PT services  PT Problem List Decreased strength;Decreased activity tolerance;Decreased balance;Decreased mobility;Cardiopulmonary status limiting activity       PT Treatment Interventions DME instruction;Functional mobility training;Therapeutic activities;Patient/family education;Neuromuscular re-education;Therapeutic exercise;Manual techniques;Wheelchair mobility training    PT Goals (Current goals can be found in the Care Plan section)  Acute Rehab PT Goals Patient Stated Goal: "Get my breathing better" PT Goal Formulation: With patient Time For Goal Achievement: 11/02/21 Potential to Achieve Goals: Good    Frequency Min 3X/week   Barriers to discharge        Co-evaluation               AM-PAC PT "6 Clicks" Mobility  Outcome Measure Help needed turning from your back to your side while in a flat bed without using bedrails?: None Help needed moving from lying on your back to sitting on the side of a flat bed without using bedrails?: None Help needed moving to and from a bed to a chair (including a wheelchair)?: A Little Help needed standing up from a chair using your arms (e.g., wheelchair or bedside chair)?: A Little Help needed to walk in hospital room?: A Lot Help needed climbing 3-5 steps with a railing? :  A Lot 6 Click Score: 18    End of Session   Activity Tolerance: Patient limited by fatigue Patient left: in bed;with call bell/phone within reach Nurse Communication: Mobility status PT Visit Diagnosis: Muscle weakness (generalized) (M62.81);Other abnormalities of gait and mobility (R26.89)    Time: 8242-3536 PT Time Calculation (min) (ACUTE ONLY): 30 min   Charges:   PT Evaluation $PT Eval Low Complexity: 1 Low PT Treatments $Therapeutic Activity: 8-22 mins       11:58 AM, 10/29/21 M. Sherlyn Lees, PT, DPT Physical Therapist- [place] Office Number: [phone]

## 2021-10-29 NOTE — Progress Notes (Addendum)
PROGRESS NOTE     Tony Sullivan, is a 61 y.o. male, DOB - 07/08/1961, LKG:[id number]  Admit date - 10/27/2021   Admitting Physician Estel Tonelli Denton Brick, MD  Outpatient Primary MD for the patient is Tony Drafts, FNP  LOS - 2  Chief Complaint  Patient presents with   Shortness of Breath      Brief Narrative:  61 y.o. male with medical history significant for COPD on 3-4 LPM of home oxygen, GERD, hypertension, anxiety, osteoarthritis, tobacco and cocaine use and history of cervical fusion admitted on 10/26/2021 with acute on chronic hypoxic and hypercapnic respiratory failure with uncompensated respiratory acidosis - Per report, EMS was activated due to shortness of breath, on arrival of EMS team, patient was unresponsive, O2 sat was noted to be in the 60s, he was placed on a NRB and was taken to the ED for further evaluation and management  Assessment & Plan:   Principal Problem:   COPD with acute exacerbation ([place]) Active Problems:   Acute on chronic respiratory failure with hypoxia and hypercapnia (HCC)   Leukocytosis   Elevated troponin   Hyperglycemia   Elevated MCV   Respiratory failure with hypoxia and hypercapnia (HCC)   1)Acute COPD Exacerbation- no definite pneumonia,   -c/n  IV Solu-Medrol 40 mg every 12 hours, give mucolytics, azithromycin and bronchodilators as ordered, supplemental oxygen as ordered.  10/28/21 -Cough, wheezing, dyspnea and hypoxia improving--- -Overall oxygen oxygen requirement is improving -No longer requiring BiPAP during the day, may benefit from BiPAP nightly and as needed  2) acute on chronic hypoxic and hypercapnic respiratory failure--secondary to #1 above -Patient required prolonged BiPAP use initially -ABG showed pH of 7.26 with PCO2 of 17.2-70 7 repeat ABG with pH of 7.31 PCO2 of 69.7 -Continue BiPAP nightly and as needed -Treat as above #1 -At baseline patient supposed to be on 3 to 4L/min of oxygen at Home admits to noncompliance  due to need to smoke tobacco -Initially on arrival via EMS apparently patient's O2 sats were in the 60s, required continuous BiPAP  3) elevated troponin--- due to demand ischemia in the setting of respiratory failure with hypoxia -Troponin 20>>48>>100>>92 >>99 -EKG nonacute -Echo with EF of 55 to 60%, no wall motion normalities, grade 1 diastolic dysfunction noted  4)HFpEF--- echo as above #3 --BNP 82, chest x-ray without pulmonary edema -Appears compensated from CHF standpoint,  5)GERD--- Protonix especially while on steroid   6)HTN--Amlodipine 10 mg daily,   may use IV Hydralazine 10 mg  Every 4 hours Prn for systolic blood pressure over 170 mmhg  7) social/ethics--- patient is a full code  8) crack cocaine use--- patient admits to using crack cocaine frequently including within 24 hours of being admitted at this time around -Outpatient rehab programs advised  9)--tobacco abuse--- smoking cessation advised May use nicotine patch - 10) generalized weakness and deconditioning/ambulatory dysfunction--- PT eval appreciated recommends home health PT, -Patient has home health aide services  Disposition/Need for in-Hospital Stay- patient unable to be discharged at this time due to --hypoxic respiratory failure with hypercapnia and uncompensated respiratory acidosis--- requiring BiPAP, IV steroids and supplemental oxygen -Anticipate discharge home on 10/30/2021  if  continues to improve  Status is: Inpatient  Remains inpatient appropriate because:   Disposition: The patient is from: Home              Anticipated d/c is to: Home with [facility] PT              Anticipated  d/c date is: 1 day              Patient currently is not medically stable to d/c. Barriers: Not Clinically Stable-   Code Status :  -  Code Status: Full Code   Family Communication:    NA (patient is alert, awake and coherent)   Consults  :    DVT Prophylaxis  :   - SCDs  enoxaparin (LOVENOX) injection 40 mg Start:  10/27/21 0800 SCDs Start: 10/27/21 8756  Lab Results  Component Value Date   PLT 223 10/28/2021    Inpatient Medications  Scheduled Meds:  amLODipine  10 mg Oral Daily   atorvastatin  40 mg Oral Daily   azithromycin  250 mg Oral Daily   benztropine  0.5 mg Oral QHS   Chlorhexidine Gluconate Cloth  6 each Topical Daily   dextromethorphan-guaiFENesin  1 tablet Oral BID   dorzolamide  1 drop Left Eye BID   enoxaparin (LOVENOX) injection  40 mg Subcutaneous Q24H   ipratropium-albuterol  3 mL Nebulization Q6H   latanoprost  1 drop Both Eyes QHS   mouth rinse  15 mL Mouth Rinse BID   methylPREDNISolone (SOLU-MEDROL) injection  40 mg Intravenous Q12H   mirtazapine  30 mg Oral QHS   mometasone-formoterol  2 puff Inhalation BID   multivitamin with minerals  1 tablet Oral Daily   pantoprazole  40 mg Oral Daily   phosphorus  250 mg Oral TID   risperiDONE  2 mg Oral QHS   senna-docusate  2 tablet Oral QHS   Continuous Infusions: PRN Meds:.acetaminophen **OR** acetaminophen, albuterol, hydrALAZINE, hydrOXYzine   Anti-infectives (From admission, onward)    Start     Dose/Rate Route Frequency Ordered Stop   10/28/21 1000  azithromycin (ZITHROMAX) tablet 250 mg       See Hyperspace for full Linked Orders Report.   250 mg Oral Daily 10/27/21 0640 11/01/21 0959   10/27/21 1000  azithromycin (ZITHROMAX) tablet 500 mg       See Hyperspace for full Linked Orders Report.   500 mg Oral Daily 10/27/21 0640 10/27/21 0914        Subjective: Gwenyth Bouillon today has no fevers, no emesis,  No chest pain,   No fever  Or chills   -Cough, wheezing, dyspnea and hypoxia improving--- -Overall oxygen oxygen requirement is improving  RN Deirdre Pippins is present throughout my visit with patient  Objective: Vitals:   10/29/21 0748 10/29/21 1128 10/29/21 1329 10/29/21 1347  BP:   133/85   Pulse:   88   Resp:   18   Temp:   98.2 F (36.8 C)   TempSrc:   Oral   SpO2: 96% 96% 97% 100%  Weight:       Height:        Intake/Output Summary (Last 24 hours) at 10/29/2021 1542 Last data filed at 10/29/2021 1331 Gross per 24 hour  Intake 720 ml  Output 700 ml  Net 20 ml   Filed Weights   10/27/21 0920  Weight: 65.6 kg    Physical Exam  Gen:- Awake Alert, no acute distress  HEENT:- [place].AT, No sclera icterus [place] 4L/min Neck-Supple Neck,No JVD,.  Lungs-improving air movement, no rhonchi, few scattered wheezes noted CV- S1, S2 normal, regular  Abd-  +ve B.Sounds, Abd Soft, No tenderness,    Extremity/Skin:- No  edema, pedal pulses present  Psych-affect is anxious,, oriented x3 Neuro-generalized weakness, no new focal deficits, no tremors  Data  Reviewed: I have personally reviewed following labs and imaging studies  CBC: Recent Labs  Lab 10/27/21 0032 10/28/21 0447  WBC 12.4* 12.2*  NEUTROABS 10.0*  --   HGB 15.3 14.0  HCT 47.2 44.2  MCV 100.6* 100.7*  PLT 249 382   Basic Metabolic Panel: Recent Labs  Lab 10/27/21 0032 10/27/21 0728 10/28/21 0447 10/29/21 0641  NA 139  --  140 140  K 3.7  --  4.6 3.5  CL 95*  --  97* 100  CO2 33*  --  34* 32  GLUCOSE 138*  --  147* 130*  BUN 21*  --  32* 25*  CREATININE 0.98  --  0.89 0.87  CALCIUM 9.0  --  8.8* 8.8*  MG  --  3.0*  --   --   PHOS  --  4.0  --  2.1*   GFR: Estimated Creatinine Clearance: 85.9 mL/min (by C-G formula based on SCr of 0.87 mg/dL). Liver Function Tests: Recent Labs  Lab 10/28/21 0447 10/29/21 0641  AST 30  --   ALT 29  --   ALKPHOS 52  --   BILITOT 1.3*  --   PROT 6.3*  --   ALBUMIN 3.1* 2.9*   No results for input(s): LIPASE, AMYLASE in the last 168 hours. No results for input(s): AMMONIA in the last 168 hours. Coagulation Profile: No results for input(s): INR, PROTIME in the last 168 hours. Cardiac Enzymes: No results for input(s): CKTOTAL, CKMB, CKMBINDEX, TROPONINI in the last 168 hours. BNP (last 3 results) No results for input(s): PROBNP in the last 8760 hours. HbA1C: No  results for input(s): HGBA1C in the last 72 hours. CBG: No results for input(s): GLUCAP in the last 168 hours. Lipid Profile: No results for input(s): CHOL, HDL, LDLCALC, TRIG, CHOLHDL, LDLDIRECT in the last 72 hours. Thyroid Function Tests: No results for input(s): TSH, T4TOTAL, FREET4, T3FREE, THYROIDAB in the last 72 hours. Anemia Panel: Recent Labs    10/27/21 0728  VITAMINB12 733  FOLATE 21.5   Urine analysis:    Component Value Date/Time   COLORURINE YELLOW 10/16/2021 0130   APPEARANCEUR CLEAR 10/16/2021 0130   LABSPEC 1.020 10/16/2021 0130   PHURINE 6.0 10/16/2021 0130   GLUCOSEU NEGATIVE 10/16/2021 0130   HGBUR TRACE (A) 10/16/2021 0130   BILIRUBINUR NEGATIVE 10/16/2021 0130   KETONESUR NEGATIVE 10/16/2021 0130   PROTEINUR NEGATIVE 10/16/2021 0130   UROBILINOGEN 1.0 06/12/2015 1021   NITRITE NEGATIVE 10/16/2021 0130   LEUKOCYTESUR NEGATIVE 10/16/2021 0130   Sepsis Labs: @LABRCNTIP (procalcitonin:4,lacticidven:4)  ) Recent Results (from the past 240 hour(s))  Resp Panel by RT-PCR (Flu A&B, Covid) Nasopharyngeal Swab     Status: None   Collection Time: 10/27/21 12:43 AM   Specimen: Nasopharyngeal Swab; Nasopharyngeal(NP) swabs in vial transport medium  Result Value Ref Range Status   SARS Coronavirus 2 by RT PCR NEGATIVE NEGATIVE Final    Comment: (NOTE) SARS-CoV-2 target nucleic acids are NOT DETECTED.  The SARS-CoV-2 RNA is generally detectable in upper respiratory specimens during the acute phase of infection. The lowest concentration of SARS-CoV-2 viral copies this assay can detect is 138 copies/mL. A negative result does not preclude SARS-Cov-2 infection and should not be used as the sole basis for treatment or other patient management decisions. A negative result may occur with  improper specimen collection/handling, submission of specimen other than nasopharyngeal swab, presence of viral mutation(s) within the areas targeted by this assay, and inadequate  number of viral copies(<138 copies/mL). A  negative result must be combined with clinical observations, patient history, and epidemiological information. The expected result is Negative.  Fact Sheet for Patients:  EntrepreneurPulse.com.au  Fact Sheet for Healthcare Providers:  IncredibleEmployment.be  This test is no t yet approved or cleared by the [place] FDA and  has been authorized for detection and/or diagnosis of SARS-CoV-2 by FDA under an Emergency Use Authorization (EUA). This EUA will remain  in effect (meaning this test can be used) for the duration of the COVID-19 declaration under Section 564(b)(1) of the Act, 21 U.S.C.section 360bbb-3(b)(1), unless the authorization is terminated  or revoked sooner.       Influenza A by PCR NEGATIVE NEGATIVE Final   Influenza B by PCR NEGATIVE NEGATIVE Final    Comment: (NOTE) The Xpert Xpress SARS-CoV-2/FLU/RSV plus assay is intended as an aid in the diagnosis of influenza from Nasopharyngeal swab specimens and should not be used as a sole basis for treatment. Nasal washings and aspirates are unacceptable for Xpert Xpress SARS-CoV-2/FLU/RSV testing.  Fact Sheet for Patients: EntrepreneurPulse.com.au  Fact Sheet for Healthcare Providers: IncredibleEmployment.be  This test is not yet approved or cleared by the [place] FDA and has been authorized for detection and/or diagnosis of SARS-CoV-2 by FDA under an Emergency Use Authorization (EUA). This EUA will remain in effect (meaning this test can be used) for the duration of the COVID-19 declaration under Section 564(b)(1) of the Act, 21 U.S.C. section 360bbb-3(b)(1), unless the authorization is terminated or revoked.  Performed at [facility], [address], [place], [place] [address]   MRSA Next Gen by PCR, Nasal     Status: None   Collection Time: 10/27/21  9:27 AM   Specimen: Nasal Mucosa;  Nasal Swab  Result Value Ref Range Status   MRSA by PCR Next Gen NOT DETECTED NOT DETECTED Final    Comment: (NOTE) The GeneXpert MRSA Assay (FDA approved for NASAL specimens only), is one component of a comprehensive MRSA colonization surveillance program. It is not intended to diagnose MRSA infection nor to guide or monitor treatment for MRSA infections. Test performance is not FDA approved in patients less than 50 years old. Performed at [facility], [address] [place], [place] [address]    Radiology Studies: No results found.  Scheduled Meds:  amLODipine  10 mg Oral Daily   atorvastatin  40 mg Oral Daily   azithromycin  250 mg Oral Daily   benztropine  0.5 mg Oral QHS   Chlorhexidine Gluconate Cloth  6 each Topical Daily   dextromethorphan-guaiFENesin  1 tablet Oral BID   dorzolamide  1 drop Left Eye BID   enoxaparin (LOVENOX) injection  40 mg Subcutaneous Q24H   ipratropium-albuterol  3 mL Nebulization Q6H   latanoprost  1 drop Both Eyes QHS   mouth rinse  15 mL Mouth Rinse BID   methylPREDNISolone (SOLU-MEDROL) injection  40 mg Intravenous Q12H   mirtazapine  30 mg Oral QHS   mometasone-formoterol  2 puff Inhalation BID   multivitamin with minerals  1 tablet Oral Daily   pantoprazole  40 mg Oral Daily   phosphorus  250 mg Oral TID   risperiDONE  2 mg Oral QHS   senna-docusate  2 tablet Oral QHS   Continuous Infusions:   LOS: 2 days   [name] M.D on 10/29/2021 at 3:42 PM  Go to [email or web address] - for contact info  [facility] - Office  [phone]  If 7PM-7AM, please contact night-coverage [email or web address] Password [facility] 10/29/2021, 3:42  PM

## 2021-10-29 NOTE — Plan of Care (Signed)
°  Problem: Acute Rehab PT Goals(only PT should resolve) Goal: Pt Will Transfer Bed To Chair/Chair To Bed Outcome: Progressing Flowsheets (Taken 10/29/2021 1202) Pt will Transfer Bed to Chair/Chair to Bed: with supervision Goal: PT Additional Goal #1 Outcome: Progressing Flowsheets (Taken 10/29/2021 1202) Additional Goal #1: Demo independence with HEP for BLE endurance-based exercises to improve functional activity tolerance   12:03 PM, 10/29/21 M. Sherlyn Lees, PT, DPT Physical Therapist- [place] Office Number: [phone]

## 2021-10-30 DIAGNOSIS — J441 Chronic obstructive pulmonary disease with (acute) exacerbation: Secondary | ICD-10-CM | POA: Diagnosis not present

## 2021-10-30 MED ORDER — AZITHROMYCIN 500 MG PO TABS: 500.0000 mg | ORAL_TABLET | Freq: Every day | ORAL | 0 refills | Status: AC

## 2021-10-30 MED ORDER — ALBUTEROL SULFATE HFA 108 (90 BASE) MCG/ACT IN AERS
2.0000 | INHALATION_SPRAY | Freq: Four times a day (QID) | RESPIRATORY_TRACT | 2 refills | Status: DC | PRN
Start: 2021-10-30 — End: 2021-11-26

## 2021-10-30 MED ORDER — PANTOPRAZOLE SODIUM 40 MG PO TBEC: 40.0000 mg | DELAYED_RELEASE_TABLET | Freq: Every day | ORAL | 4 refills | Status: DC

## 2021-10-30 MED ORDER — ALBUTEROL SULFATE (2.5 MG/3ML) 0.083% IN NEBU: 2.5000 mg | INHALATION_SOLUTION | RESPIRATORY_TRACT | 2 refills | Status: DC | PRN

## 2021-10-30 MED ORDER — AMLODIPINE BESYLATE 10 MG PO TABS: 10.0000 mg | ORAL_TABLET | Freq: Every day | ORAL | 5 refills | Status: DC

## 2021-10-30 MED ORDER — HYDROCOD POLST-CPM POLST ER 10-8 MG/5ML PO SUER
10.0000 mL | Freq: Once | ORAL | Status: AC
  Administered 2021-10-30: 10 mL via ORAL
  Filled 2021-10-30: qty 10

## 2021-10-30 MED ORDER — MIRTAZAPINE 30 MG PO TABS: 30.0000 mg | ORAL_TABLET | Freq: Every day | ORAL | 3 refills | Status: DC

## 2021-10-30 MED ORDER — SENNOSIDES-DOCUSATE SODIUM 8.6-50 MG PO TABS: 2.0000 | ORAL_TABLET | Freq: Every day | ORAL | 3 refills | Status: AC

## 2021-10-30 MED ORDER — PREDNISONE 20 MG PO TABS: 40.0000 mg | ORAL_TABLET | Freq: Every day | ORAL | 0 refills | Status: AC

## 2021-10-30 MED ORDER — DM-GUAIFENESIN ER 30-600 MG PO TB12: 1.0000 | ORAL_TABLET | Freq: Two times a day (BID) | ORAL | 0 refills | Status: DC

## 2021-10-30 MED ORDER — BREZTRI AEROSPHERE 160-9-4.8 MCG/ACT IN AERO: 2.0000 | INHALATION_SPRAY | Freq: Two times a day (BID) | RESPIRATORY_TRACT | 3 refills | Status: DC

## 2021-10-30 MED ORDER — BISOPROLOL-HYDROCHLOROTHIAZIDE 5-6.25 MG PO TABS
1.0000 | ORAL_TABLET | Freq: Every day | ORAL | Status: DC
  Administered 2021-10-30: 1 via ORAL
  Filled 2021-10-30 (×2): qty 1

## 2021-10-30 MED ORDER — HYDROCHLOROTHIAZIDE 25 MG PO TABS: 12.5000 mg | ORAL_TABLET | Freq: Every day | ORAL | 1 refills | Status: DC

## 2021-10-30 MED ORDER — HYDROXYZINE HCL 25 MG PO TABS: 25.0000 mg | ORAL_TABLET | Freq: Three times a day (TID) | ORAL | 2 refills | Status: DC | PRN

## 2021-10-30 MED ORDER — RISPERIDONE 2 MG PO TABS: 2.0000 mg | ORAL_TABLET | Freq: Every day | ORAL | 3 refills | Status: DC

## 2021-10-30 MED ORDER — NICOTINE 21 MG/24HR TD PT24: 21.0000 mg | MEDICATED_PATCH | TRANSDERMAL | 0 refills | Status: AC

## 2021-10-30 MED ORDER — ACETAMINOPHEN 325 MG PO TABS: 650.0000 mg | ORAL_TABLET | Freq: Four times a day (QID) | ORAL | 1 refills | Status: AC | PRN

## 2021-10-30 MED ORDER — ADULT MULTIVITAMIN W/MINERALS CH: 1.0000 | ORAL_TABLET | Freq: Every day | ORAL | 2 refills | Status: DC

## 2021-10-30 MED ORDER — BUDESONIDE-FORMOTEROL FUMARATE 160-4.5 MCG/ACT IN AERO: 2.0000 | INHALATION_SPRAY | Freq: Two times a day (BID) | RESPIRATORY_TRACT | 12 refills | Status: DC

## 2021-10-30 NOTE — TOC Transition Note (Signed)
Transition of Care [facility]) - CM/SW Discharge Note   Patient Details  Name: Tony Sullivan MRN: [id number] Date of Birth: [birth year]  [facility] CM/SW Contact:  Ihor Gully, LCSW Phone Number: 10/30/2021, 12:52 PM   Clinical Narrative:    Patient from home. Admitted with COPD. On home oxygen, uses wheelchair. Has aide through [place] 2.5-3 hours, seven days a week. He is receiving SA services form an agency however, he does not know the agency name. He request a BSC. DME providers discussed. Ordered via [employer].   Final next level of care: Home/Self Care Barriers to Discharge: No Barriers Identified   Patient Goals and CMS Choice        Discharge Placement                       Discharge Plan and Services     Post Acute Care Choice: Durable Medical Equipment          DME Arranged: 3-N-1 DME Agency: [employer] Date DME Agency Contacted: 10/30/21 Time DME Agency Contacted: 1252 Representative spoke with at DME Agency: [place] ([place]) Interventions     Readmission Risk Interventions Readmission Risk Prevention Plan 09/08/2019  Transportation Screening Complete  PCP or Specialist Appt within 3-5 Days Complete  HRI or [place] Complete  Social Work Consult for [place] Planning/Counseling Complete  Palliative Care Screening Not Applicable  Medication Review [employer]) Complete  Some recent data might be hidden

## 2021-10-30 NOTE — Discharge Summary (Addendum)
Tony Sullivan, is a 61 y.o. male  DOB 11-05-1960  MRN [id number].  Admission date:  10/27/2021  Admitting Physician  [name], MD  Discharge Date:  10/30/2021   Primary MD  [name], FNP  Recommendations for primary care physician for things to follow:  1) complete abstinence from tobacco strongly advised----May use nicotine patch to help you quit smoking   2) complete abstinence from crack cocaine strongly advised--- please consider outpatient or inpatient drug rehab program  3) please note that there has been changes to your medications  4)you need oxygen at home at 3 to 4 L via nasal cannula continuously while awake and while asleep--- smoking or having open fires around oxygen can cause fire, significant injury and death   Admission Diagnosis  Acute respiratory failure with hypercapnia ([place]) [J96.02] COPD with acute exacerbation ([place]) [J44.1] Respiratory failure with hypoxia and hypercapnia ([place]) [J96.91, J96.92]   Discharge Diagnosis  Acute respiratory failure with hypercapnia ([place]) [J96.02] COPD with acute exacerbation ([place]) [J44.1] Respiratory failure with hypoxia and hypercapnia ([place]) [J96.91, J96.92]    Principal Problem:   COPD with acute exacerbation ([place]) Active Problems:   Acute on chronic respiratory failure with hypoxia and hypercapnia ([facility])   Leukocytosis   Elevated troponin   Hyperglycemia   Elevated MCV   Respiratory failure with hypoxia and hypercapnia ([place])      Past Medical History:  Diagnosis Date   Anxiety    Arthritis    COPD (chronic obstructive pulmonary disease) ([place]) 2004   diagnosed in 2004, no PFT's to date.  Started on home O2 12/2013, after found to be desatting at PCP's office, and referred to pulmonology.   Depression    GERD (gastroesophageal reflux disease)    High blood pressure    Prediabetes    Seasonal allergies    Tobacco  dependence     Past Surgical History:  Procedure Laterality Date   ANTERIOR CERVICAL DECOMP/DISCECTOMY FUSION N/A 09/06/2019   Procedure: Anterior Cervical Discecectomy Fusion - Cerivcal Five-Cervical Six;  Surgeon: Kary Kos, MD;  Location: [facility];  Service: Neurosurgery;  Laterality: N/A;  Anterior Cervical Discecectomy Fusion - Cerivcal Five-Cervical Six   ANTERIOR CERVICAL DISCECTOMY  09/06/2019   APPENDECTOMY  1980   COLONOSCOPY WITH PROPOFOL N/A 12/10/2016   Procedure: COLONOSCOPY WITH PROPOFOL;  Surgeon: [name], MD;  Location: [facility];  Service: Endoscopy;  Laterality: N/A;   HIP SURGERY Right    SHOULDER ARTHROSCOPY Right      HPI  from the history and physical done on the day of admission:    Chief Complaint: Shortness of breath   HPI: Tony Sullivan is a 61 y.o. male with medical history significant for COPD on 3-4 LPM of home oxygen, GERD, hypertension, anxiety, osteoarthritis, tobacco and cocaine use and history of cervical fusion who presents to the emergency department due to shortness of breath.  Patient was unable to provide history possibly due to being in acute distress.  History was obtained from ED  physician and ED medical record.  Per report, EMS was activated due to shortness of breath, on arrival of EMS team, patient was unresponsive, O2 sat was noted to be in the 60s, he was placed on a NRB and was taken to the ED for further evaluation and management.  Patient was reported to be more arousable by the time he arrived at the ED and was able to answer some questions, patient stated that the had chest cold for a few days and that this chest felt tight, he denied fever, vomiting, abdominal pain.   ED Course:  In the emergency department, he was hemodynamically stable.  Work-up in the ED showed leukocytosis, elevated MCV, BMP showed hyperglycemia.  ABG done showed hypercapnia.  D-dimer was normal, troponin x2- 20 > 48.,  BNP 82.0.  Influenza A, B, SARS coronavirus 2  was negative. Chest x-ray showed no active disease, but showed emphysema with stable pulmonary hyperinflation Breathing treatment was provided, Solu-Medrol 125 mg x 1 was given, morphine was given.  Hospitalist was asked to admit patient for further evaluation and management     Hospital Course:      1)Acute COPD Exacerbation- no definite pneumonia,   -c/n  IV Solu-Medrol 40 mg every 12 hours, give mucolytics, azithromycin and bronchodilators as ordered, supplemental oxygen as ordered.  10/28/21 -Cough, dyspnea and hypoxia improved--- -Wheezing is resolved -Overall oxygen oxygen requirement is improved (currently requiring only 3 L of oxygen via nasal cannula at baseline patient uses up to 4 L at home   2) acute on chronic hypoxic and hypercapnic respiratory failure--secondary to #1 above -Patient required prolonged BiPAP use initially -ABG showed pH of 7.26 with PCO2 of 17.2-70 7 repeat ABG with pH of 7.31 PCO2 of 69.7 -Continue BiPAP nightly and as needed -Treat as above #1 -At baseline patient supposed to be on 3 to 4L/min of oxygen at Home admits to noncompliance due to need to smoke tobacco -Initially on arrival via EMS apparently patient's O2 sats were in the 60s, required continuous BiPAP  - Much improved overall, see #1 above  3) elevated troponin--- due to demand ischemia in the setting of respiratory failure with hypoxia -Troponin 20>>48>>100>>92 >>99 -EKG nonacute -Echo with EF of 55 to 60%, no wall motion normalities, grade 1 diastolic dysfunction noted -Avoid beta-blockers due to ongoing chronic cocaine use and significant COPD/bronchial spasms   4)HFpEF--- echo as above #3 --BNP 82, chest x-ray without pulmonary edema -Appears compensated from CHF standpoint,   5)GERD--- Protonix especially while on steroid     6)HTN--Amlodipine 10 mg daily,     7) social/ethics--- patient is a full code -patient with advanced COPD/chronic hypoxic respiratory failure with  history of noncompliance, ongoing tobacco and crack cocaine inhalation/smoke--- overall prognosis is poor if lifestyle does not change--specifically patient does not quit smoking tobacco and crack cocaine   8) crack cocaine use--- patient admits to using crack cocaine frequently including within 24 hours of being admitted at this time around -Outpatient rehab programs advised   9)--tobacco abuse--- smoking cessation advised  -May use nicotine patch - 10) generalized weakness and deconditioning/ambulatory dysfunction--- PT eval appreciated recommends home health PT, -Patient has home health aide services   Disposition== Home with home health services    Disposition: The patient is from: Home              Anticipated d/c is to: Home with [facility] PT  Code Status :  -  Code Status: Full Code    Family Communication:    NA (patient is alert, awake and coherent)     Discharge Condition: stable-- patient with advanced COPD/chronic hypoxic respiratory failure with history of noncompliance, ongoing tobacco and crack cocaine inhalation/smoke--- overall prognosis is poor if lifestyle does not change--specifically patient does not quit smoking tobacco and crack cocaine  Follow UP   Follow-up Information     [name], FNP. Schedule an appointment as soon as possible for a visit in 1 week(s).   Specialty: Nurse Practitioner Contact information: [address] [place] [address] [phone]                  Diet and Activity recommendation:  As advised  Discharge Instructions    Discharge Instructions     Call MD for:  difficulty breathing, headache or visual disturbances   Complete by: As directed    Call MD for:  persistant dizziness or light-headedness   Complete by: As directed    Call MD for:  persistant nausea and vomiting   Complete by: As directed    Call MD for:  temperature >100.4   Complete by: As directed    Diet - low  sodium heart healthy   Complete by: As directed    Discharge instructions   Complete by: As directed    1) complete abstinence from tobacco strongly advised----May use nicotine patch to help you quit smoking   2) complete abstinence from crack cocaine strongly advised--- please consider outpatient or inpatient drug rehab program  3) please note that there has been changes to your medications  4)you need oxygen at home at 3 to 4 L via nasal cannula continuously while awake and while asleep--- smoking or having open fires around oxygen can cause fire, significant injury and death   Increase activity slowly   Complete by: As directed          Discharge Medications     Allergies as of 10/30/2021       Reactions   Penicillins Anaphylaxis   Has patient had a PCN reaction causing immediate rash, facial/tongue/throat swelling, SOB or lightheadedness with hypotension: Yes Has patient had a PCN reaction causing severe rash involving mucus membranes or skin necrosis: No Has patient had a PCN reaction that required hospitalization No Has patient had a PCN reaction occurring within the last 10 years: No If all of the above answers are "NO", then may proceed with Cephalosporin use.   Levocetirizine Other (See Comments)   Muscle cramps        Medication List     STOP taking these medications    dicyclomine 20 MG tablet Commonly known as: BENTYL   famotidine 20 MG tablet Commonly known as: PEPCID   feeding supplement (GLUCERNA SHAKE) Liqd   fluticasone 50 MCG/ACT nasal spray Commonly known as: FLONASE   multivitamin capsule Replaced by: multivitamin with minerals Tabs tablet   oxyCODONE-acetaminophen 5-325 MG tablet Commonly known as: Percocet       TAKE these medications    acetaminophen 325 MG tablet Commonly known as: TYLENOL Take 2 tablets (650 mg total) by mouth every 6 (six) hours as needed for mild pain (or Fever >/= 101).   albuterol (2.5 MG/3ML) 0.083%  nebulizer solution Commonly known as: PROVENTIL Take 3 mLs (2.5 mg total) by nebulization every 4 (four) hours as needed for wheezing or shortness of breath.   albuterol 108 (  90 Base) MCG/ACT inhaler Commonly known as: VENTOLIN HFA Inhale 2 puffs into the lungs every 6 (six) hours as needed for wheezing or shortness of breath.   amLODipine 10 MG tablet Commonly known as: NORVASC Take 1 tablet (10 mg total) by mouth daily. For BP Start taking on: October 31, 2021   atorvastatin 40 MG tablet Commonly known as: LIPITOR TAKE ONE TABLET BY MOUTH DAILY   azithromycin 500 MG tablet Commonly known as: ZITHROMAX Take 1 tablet (500 mg total) by mouth daily for 5 days. What changed:  medication strength how much to take additional instructions   benztropine 0.5 MG tablet Commonly known as: COGENTIN Take 0.5 mg by mouth at bedtime.   Breztri Aerosphere 160-9-4.8 MCG/ACT Aero Generic drug: Budeson-Glycopyrrol-Formoterol Inhale 2 puffs into the lungs in the morning and at bedtime. What changed: Another medication with the same name was removed. Continue taking this medication, and follow the directions you see here.   budesonide-formoterol 160-4.5 MCG/ACT inhaler Commonly known as: Symbicort Inhale 2 puffs into the lungs 2 (two) times daily.   cetirizine 10 MG tablet Commonly known as: ZYRTEC Take 10 mg by mouth daily.   dextromethorphan-guaiFENesin 30-600 MG 12hr tablet Commonly known as: MUCINEX DM Take 1 tablet by mouth 2 (two) times daily.   diclofenac Sodium 1 % Gel Commonly known as: VOLTAREN Apply 2 g topically 4 (four) times daily.   dorzolamide 2 % ophthalmic solution Commonly known as: TRUSOPT Place 1 drop into the left eye 2 (two) times daily.   hydrochlorothiazide 25 MG tablet Commonly known as: HYDRODIURIL Take 0.5 tablets (12.5 mg total) by mouth daily. What changed: how much to take   hydrOXYzine 25 MG tablet Commonly known as: ATARAX Take 1 tablet (25 mg  total) by mouth 3 (three) times daily as needed for anxiety or nausea.   latanoprost 0.005 % ophthalmic solution Commonly known as: XALATAN Place 1 drop into both eyes at bedtime.   loxapine 10 MG capsule Commonly known as: LOXITANE Take 10 mg by mouth at bedtime.   megestrol 40 MG tablet Commonly known as: MEGACE Take 40 mg by mouth daily.   mirtazapine 30 MG tablet Commonly known as: REMERON Take 1 tablet (30 mg total) by mouth at bedtime.   multivitamin with minerals Tabs tablet Take 1 tablet by mouth daily. Start taking on: October 31, 2021 Replaces: multivitamin capsule   nicotine 21 mg/24hr patch Commonly known as: NICODERM CQ - dosed in mg/24 hours Place 1 patch (21 mg total) onto the skin daily for 28 days.   OXYGEN Inhale 3-4 L into the lungs as directed. 3-4 liters with sleep and exertion as needed for low oxygen   pantoprazole 40 MG tablet Commonly known as: PROTONIX Take 1 tablet (40 mg total) by mouth daily.   polyethylene glycol 17 g packet Commonly known as: MIRALAX / GLYCOLAX TAKE 17 GRAMS BY MOUTH DAILY What changed: See the new instructions.   predniSONE 20 MG tablet Commonly known as: DELTASONE Take 2 tablets (40 mg total) by mouth daily with breakfast for 5 days. What changed:  medication strength See the new instructions. Another medication with the same name was removed. Continue taking this medication, and follow the directions you see here.   risperiDONE 2 MG tablet Commonly known as: RISPERDAL Take 1 tablet (2 mg total) by mouth at bedtime.   senna-docusate 8.6-50 MG tablet Commonly known as: Senokot-S Take 2 tablets by mouth at bedtime.   simethicone 40 MG/0.6ML drops Commonly  known as: MYLICON Take 0.6 mLs (40 mg total) by mouth 4 (four) times daily as needed for flatulence.        Major procedures and Radiology Reports - PLEASE review detailed and final reports for all details, in brief -  DG Chest 2 View  Result Date:  10/01/2021 CLINICAL DATA:  Chest pain with cough for more than 1 week. History of COPD. EXAM: CHEST - 2 VIEW COMPARISON:  Radiographs 09/24/2021.  CT 05/12/2018. FINDINGS: The PA view was repeated. The heart size and mediastinal contours are stable. The lungs are hyperinflated but clear. No evidence of pleural effusion or pneumothorax. Old right-sided rib fracture noted. No acute osseous findings. IMPRESSION: Stable changes of chronic obstructive pulmonary disease. No acute cardiopulmonary process identified. Electronically Signed   By: Richardean Sale M.D.   On: 10/01/2021 13:36   CT Head Wo Contrast  Result Date: 10/27/2021 CLINICAL DATA:  Mental status change. EXAM: CT HEAD WITHOUT CONTRAST TECHNIQUE: Contiguous axial images were obtained from the base of the skull through the vertex without intravenous contrast. COMPARISON:  Brain MRI 05/06/2021 FINDINGS: Brain: No evidence of acute infarction, hemorrhage, hydrocephalus, extra-axial collection or mass lesion/mass effect. There is mild diffuse low-attenuation within the subcortical and periventricular white matter compatible with chronic microvascular disease. Vascular: No hyperdense vessel or unexpected calcification. Skull: Normal. Negative for fracture or focal lesion. Sinuses/Orbits: No acute finding. Other: None. IMPRESSION: 1. No acute intracranial abnormalities. 2. Mild chronic small vessel ischemic change. Electronically Signed   By: Kerby Moors M.D.   On: 10/27/2021 09:34   DG Chest Portable 1 View  Result Date: 10/27/2021 CLINICAL DATA:  Dyspnea, COPD EXAM: PORTABLE CHEST 1 VIEW COMPARISON:  10/17/2021 FINDINGS: Stable pulmonary hyperinflation. Paucity of vasculature within the lung apices in keeping with underlying bullous emphysema. No focal pulmonary nodule or infiltrate. Healed right posterior sixth rib fracture again noted. No pneumothorax or pleural effusion. Cardiac size within normal limits. No acute bone abnormality. IMPRESSION: No  active disease.  Emphysema with stable pulmonary hyperinflation. Electronically Signed   By: Fidela Salisbury M.D.   On: 10/27/2021 01:12   DG Chest Port 1 View  Result Date: 10/17/2021 CLINICAL DATA:  Shortness of breath and chest tightness. EXAM: PORTABLE CHEST 1 VIEW COMPARISON:  October 01, 2021 FINDINGS: The lungs are hyperinflated. Diffuse, chronic appearing increased interstitial lung markings are noted. There is no evidence of acute infiltrate or pneumothorax. Mild, stable blunting of the left costophrenic angle is seen. The heart size and mediastinal contours are within normal limits. The visualized skeletal structures are unremarkable. IMPRESSION: 1. Findings consistent with COPD. 2. No acute or active cardiopulmonary disease. Electronically Signed   By: Virgina Norfolk M.D.   On: 10/17/2021 00:16   ECHOCARDIOGRAM COMPLETE  Result Date: 10/27/2021    ECHOCARDIOGRAM REPORT   Patient Name:   Tony Sullivan [place] Date of Exam: 10/27/2021 Medical Rec #:  [id number]      Height:       71.0 in Accession #:    [id number]     Weight:       144.6 lb Date of Birth:  05/23/1961       BSA:          1.837 m Patient Age:    14 years       BP:           120/71 mmHg Patient Gender: M              HR:  63 bpm. Exam Location:  [facility] Procedure: 2D Echo, Cardiac Doppler and Color Doppler Indications:    Dyspnea  History:        Patient has prior history of Echocardiogram examinations, most                 recent 05/05/2021. COPD; Risk Factors:Current Smoker and                 Hypertension.  Sonographer:    Bernadene Person RDCS Referring Phys: [place]  1. Left ventricular ejection fraction, by estimation, is 55 to 60%. The left ventricle has normal function. The left ventricle has no regional wall motion abnormalities. Left ventricular diastolic parameters are consistent with Grade I diastolic dysfunction (impaired relaxation).  2. Right ventricular systolic function is normal. The  right ventricular size is normal. There is normal pulmonary artery systolic pressure. The estimated right ventricular systolic pressure is 17.4 mmHg.  3. The mitral valve is grossly normal. No evidence of mitral valve regurgitation.  4. The aortic valve is tricuspid. Aortic valve regurgitation is not visualized.  5. The inferior vena cava is dilated in size with >50% respiratory variability, suggesting right atrial pressure of 8 mmHg. Comparison(s): Changes from prior study are noted. 05/05/2021: LVEF 60-65%, grade 1 DD. FINDINGS  Left Ventricle: Left ventricular ejection fraction, by estimation, is 55 to 60%. The left ventricle has normal function. The left ventricle has no regional wall motion abnormalities. The left ventricular internal cavity size was normal in size. There is  no left ventricular hypertrophy. Left ventricular diastolic parameters are consistent with Grade I diastolic dysfunction (impaired relaxation). Indeterminate filling pressures. Right Ventricle: The right ventricular size is normal. No increase in right ventricular wall thickness. Right ventricular systolic function is normal. There is normal pulmonary artery systolic pressure. The tricuspid regurgitant velocity is 2.48 m/s, and  with an assumed right atrial pressure of 8 mmHg, the estimated right ventricular systolic pressure is 08.1 mmHg. Left Atrium: Left atrial size was normal in size. Right Atrium: Right atrial size was normal in size. Pericardium: There is no evidence of pericardial effusion. Mitral Valve: The mitral valve is grossly normal. No evidence of mitral valve regurgitation. Tricuspid Valve: The tricuspid valve is grossly normal. Tricuspid valve regurgitation is trivial. Aortic Valve: The aortic valve is tricuspid. Aortic valve regurgitation is not visualized. Pulmonic Valve: The pulmonic valve was normal in structure. Pulmonic valve regurgitation is not visualized. Aorta: The aortic root and ascending aorta are structurally  normal, with no evidence of dilitation. Venous: The inferior vena cava is dilated in size with greater than 50% respiratory variability, suggesting right atrial pressure of 8 mmHg. IAS/Shunts: No atrial level shunt detected by color flow Doppler.  LEFT VENTRICLE PLAX 2D LVIDd:         4.90 cm      Diastology LVIDs:         3.00 cm      LV e' medial:    7.78 cm/s LV PW:         0.80 cm      LV E/e' medial:  11.0 LV IVS:        0.80 cm      LV e' lateral:   6.89 cm/s LVOT diam:     2.00 cm      LV E/e' lateral: 12.4 LV SV:         53 LV SV Index:   29 LVOT Area:  3.14 cm  LV Volumes (MOD) LV vol d, MOD A2C: 110.0 ml LV vol d, MOD A4C: 117.0 ml LV vol s, MOD A2C: 49.4 ml LV vol s, MOD A4C: 47.1 ml LV SV MOD A2C:     60.6 ml LV SV MOD A4C:     117.0 ml LV SV MOD BP:      64.3 ml RIGHT VENTRICLE RV S prime:     11.90 cm/s TAPSE (M-mode): 2.2 cm LEFT ATRIUM           Index        RIGHT ATRIUM           Index LA diam:      3.10 cm 1.69 cm/m   RA Area:     13.10 cm LA Vol (A2C): 35.2 ml 19.16 ml/m  RA Volume:   32.20 ml  17.53 ml/m LA Vol (A4C): 26.4 ml 14.37 ml/m  AORTIC VALVE LVOT Vmax:   97.90 cm/s LVOT Vmean:  62.350 cm/s LVOT VTI:    0.168 m  AORTA Ao Root diam: 3.60 cm Ao Asc diam:  3.50 cm MITRAL VALVE                TRICUSPID VALVE MV Area (PHT): 3.85 cm     TR Peak grad:   24.6 mmHg MV Decel Time: 197 msec     TR Vmax:        248.00 cm/s MV E velocity: 85.30 cm/s MV A velocity: 119.00 cm/s  SHUNTS MV E/A ratio:  0.72         Systemic VTI:  0.17 m                             Systemic Diam: 2.00 cm Lyman Bishop MD Electronically signed by Lyman Bishop MD Signature Date/Time: 10/27/2021/3:23:50 PM    Final    CT ANGIO CHEST/ABD/PEL FOR DISSECTION W &/OR WO CONTRAST  Result Date: 10/17/2021 CLINICAL DATA:  Acute aortic syndrome (AAS) suspected. Dyspnea, chest pain, abdominal distension EXAM: CT ANGIOGRAPHY CHEST, ABDOMEN AND PELVIS TECHNIQUE: Non-contrast CT of the chest was initially obtained.  Multidetector CT imaging through the chest, abdomen and pelvis was performed using the standard protocol during bolus administration of intravenous contrast. Multiplanar reconstructed images and MIPs were obtained and reviewed to evaluate the vascular anatomy. CONTRAST:  16mL OMNIPAQUE IOHEXOL 350 MG/ML SOLN COMPARISON:  None. FINDINGS: CTA CHEST FINDINGS Cardiovascular: Mild coronary artery calcification. Global cardiac size within normal limits. No pericardial effusion. Central pulmonary arteries are enlarged in keeping with changes of pulmonary arterial hypertension. Mild atherosclerotic calcification within the thoracic aorta. No aortic aneurysm. No intramural hematoma or dissection. Mediastinum/Nodes: No enlarged mediastinal, hilar, or axillary lymph nodes. Thyroid gland, trachea, and esophagus demonstrate no significant findings. Lungs/Pleura: Severe emphysema. No superimposed focal pulmonary infiltrate. No pneumothorax or pleural effusion. Central airways are widely patent. Mild bronchial wall thickening present in keeping with airway inflammation. Musculoskeletal: Remote right sixth rib fracture. No acute bone abnormality. Mild T10 and T11 compression deformities with approximately 10-20% loss of height. No lytic or blastic bone lesion. Review of the MIP images confirms the above findings. CTA ABDOMEN AND PELVIS FINDINGS VASCULAR Aorta: Mild atherosclerotic calcification. No aneurysm or dissection. No periaortic inflammatory change. Celiac: No hemodynamically significant stenosis, aneurysm, or dissection. Variant anatomy with early takeoff of the right hepatic artery. SMA: Patent without evidence of aneurysm, dissection, vasculitis or significant stenosis. Renals: Both renal arteries are patent  without evidence of aneurysm, dissection, vasculitis, fibromuscular dysplasia or significant stenosis. IMA: Patent without evidence of aneurysm, dissection, vasculitis or significant stenosis. Inflow: Patent without  evidence of aneurysm, dissection, vasculitis or significant stenosis. Veins: No obvious venous abnormality within the limitations of this arterial phase study. Review of the MIP images confirms the above findings. NON-VASCULAR Hepatobiliary: No focal liver abnormality is seen. No gallstones, gallbladder wall thickening, or biliary dilatation. Pancreas: Pancreatic divisum. The pancreas is otherwise unremarkable. Spleen: Unremarkable Adrenals/Urinary Tract: 2.1 cm right adrenal adenoma. Left adrenal gland is unremarkable. Kidneys are unremarkable. Bladder is unremarkable. Stomach/Bowel: Stomach is within normal limits. Appendix appears normal. No evidence of bowel wall thickening, distention, or inflammatory changes. Lymphatic: No pathologic adenopathy within the abdomen and pelvis. Reproductive: Prostate is unremarkable. Other: No abdominal wall hernia or abnormality. No abdominopelvic ascites. Musculoskeletal: No acute bone abnormality. Remote compression deformities of L1 and L3 are identified as well as inferior endplate fracture of L2. No retropulsion. No lytic or blastic bone lesion. Review of the MIP images confirms the above findings. IMPRESSION: No thoracoabdominal aortic aneurysm or dissection. No acute intrathoracic or intra-abdominal pathology identified. Mild coronary artery calcification. Severe emphysema. Complete pancreatic divisum. No superimposed peripancreatic inflammatory change. 2.1 cm right adrenal adenoma. Osteopenia with multiple remote thoracolumbar compression deformities. No acute bone abnormality. Aortic Atherosclerosis (ICD10-I70.0) and Emphysema (ICD10-J43.9). Electronically Signed   By: Fidela Salisbury M.D.   On: 10/17/2021 02:01    Micro Results   Recent Results (from the past 240 hour(s))  Resp Panel by RT-PCR (Flu A&B, Covid) Nasopharyngeal Swab     Status: None   Collection Time: 10/27/21 12:43 AM   Specimen: Nasopharyngeal Swab; Nasopharyngeal(NP) swabs in vial transport  medium  Result Value Ref Range Status   SARS Coronavirus 2 by RT PCR NEGATIVE NEGATIVE Final    Comment: (NOTE) SARS-CoV-2 target nucleic acids are NOT DETECTED.  The SARS-CoV-2 RNA is generally detectable in upper respiratory specimens during the acute phase of infection. The lowest concentration of SARS-CoV-2 viral copies this assay can detect is 138 copies/mL. A negative result does not preclude SARS-Cov-2 infection and should not be used as the sole basis for treatment or other patient management decisions. A negative result may occur with  improper specimen collection/handling, submission of specimen other than nasopharyngeal swab, presence of viral mutation(s) within the areas targeted by this assay, and inadequate number of viral copies(<138 copies/mL). A negative result must be combined with clinical observations, patient history, and epidemiological information. The expected result is Negative.  Fact Sheet for Patients:  EntrepreneurPulse.com.au  Fact Sheet for Healthcare Providers:  IncredibleEmployment.be  This test is no t yet approved or cleared by the [place] FDA and  has been authorized for detection and/or diagnosis of SARS-CoV-2 by FDA under an Emergency Use Authorization (EUA). This EUA will remain  in effect (meaning this test can be used) for the duration of the COVID-19 declaration under Section 564(b)(1) of the Act, 21 U.S.C.section 360bbb-3(b)(1), unless the authorization is terminated  or revoked sooner.       Influenza A by PCR NEGATIVE NEGATIVE Final   Influenza B by PCR NEGATIVE NEGATIVE Final    Comment: (NOTE) The Xpert Xpress SARS-CoV-2/FLU/RSV plus assay is intended as an aid in the diagnosis of influenza from Nasopharyngeal swab specimens and should not be used as a sole basis for treatment. Nasal washings and aspirates are unacceptable for Xpert Xpress SARS-CoV-2/FLU/RSV testing.  Fact Sheet for  Patients: EntrepreneurPulse.com.au  Fact Sheet for Healthcare  Providers: IncredibleEmployment.be  This test is not yet approved or cleared by the [place] and has been authorized for detection and/or diagnosis of SARS-CoV-2 by FDA under an Emergency Use Authorization (EUA). This EUA will remain in effect (meaning this test can be used) for the duration of the COVID-19 declaration under Section 564(b)(1) of the Act, 21 U.S.C. section 360bbb-3(b)(1), unless the authorization is terminated or revoked.  Performed at [facility], [address], [place], [place] [address]   MRSA Next Gen by PCR, Nasal     Status: None   Collection Time: 10/27/21  9:27 AM   Specimen: Nasal Mucosa; Nasal Swab  Result Value Ref Range Status   MRSA by PCR Next Gen NOT DETECTED NOT DETECTED Final    Comment: (NOTE) The GeneXpert MRSA Assay (FDA approved for NASAL specimens only), is one component of a comprehensive MRSA colonization surveillance program. It is not intended to diagnose MRSA infection nor to guide or monitor treatment for MRSA infections. Test performance is not FDA approved in patients less than 73 years old. Performed at [facility], [address], [place], [place] [address]    Today   [place] today has no No fever  Or chills   No Nausea, Vomiting or Diarrhea -Charge RN Emiliano Dyer at bedside with me throughout my visit with patient          Patient has been seen and examined prior to discharge   Objective   Blood pressure (!) 160/88, pulse 88, temperature 98 F (36.7 C), temperature source Oral, resp. rate 18, height 5\' 11"  (1.803 m), weight 65.6 kg, SpO2 93 %.   Intake/Output Summary (Last 24 hours) at 10/30/2021 1241 Last data filed at 10/30/2021 0930 Gross per 24 hour  Intake 240 ml  Output 2150 ml  Net -1910 ml   Exam Gen:- Awake Alert, no acute distress  HEENT:- [place].AT, No sclera icterus NOse-  3L/min Neck-Supple Neck,No JVD,.  Lungs-improved air movement, no rhonchi, no wheezes noted  CV- S1, S2 normal, regular  Abd-  +ve B.Sounds, Abd Soft, No tenderness,    Extremity/Skin:- No  edema, pedal pulses present  Psych-affect is anxious,, oriented x3 Neuro-generalized weakness, no new focal deficits, no tremors   Data Review   CBC w Diff:  Lab Results  Component Value Date   WBC 12.2 (H) 10/28/2021   HGB 14.0 10/28/2021   HCT 44.2 10/28/2021   PLT 223 10/28/2021   LYMPHOPCT 11 10/27/2021   MONOPCT 8 10/27/2021   EOSPCT 0 10/27/2021   BASOPCT 0 10/27/2021   CMP:  Lab Results  Component Value Date   NA 140 10/29/2021   NA 144 03/17/2020   K 3.5 10/29/2021   CL 100 10/29/2021   CO2 32 10/29/2021   BUN 25 (H) 10/29/2021   BUN 11 03/17/2020   CREATININE 0.87 10/29/2021   PROT 6.3 (L) 10/28/2021   ALBUMIN 2.9 (L) 10/29/2021   BILITOT 1.3 (H) 10/28/2021   ALKPHOS 52 10/28/2021   AST 30 10/28/2021   ALT 29 10/28/2021    Total Discharge time is about 33 minutes  [name] M.D on 10/30/2021 at 12:41 PM  Go to [email or web address] -  for contact info  [facility] - Office  [phone]

## 2021-10-30 NOTE — Discharge Instructions (Signed)
1) complete abstinence from tobacco strongly advised----May use nicotine patch to help you quit smoking   2) complete abstinence from crack cocaine strongly advised--- please consider outpatient or inpatient drug rehab program  3) please note that there has been changes to your medications  4)you need oxygen at home at 3 to 4 L via nasal cannula continuously while awake and while asleep--- smoking or having open fires around oxygen can cause fire, significant injury and death

## 2021-10-30 NOTE — Plan of Care (Signed)
°  Problem: Education: Goal: Knowledge of disease or condition will improve Outcome: Progressing Goal: Knowledge of the prescribed therapeutic regimen will improve Outcome: Progressing Goal: Individualized Educational Video(s) Outcome: Progressing   Problem: Activity: Goal: Ability to tolerate increased activity will improve Outcome: Progressing Goal: Will verbalize the importance of balancing activity with adequate rest periods Outcome: Progressing   Problem: Respiratory: Goal: Ability to maintain a clear airway will improve Outcome: Progressing Goal: Ability to maintain adequate ventilation will improve Outcome: Progressing

## 2021-11-13 ENCOUNTER — Telehealth: Payer: Self-pay | Admitting: Internal Medicine

## 2021-11-13 ENCOUNTER — Other Ambulatory Visit: Payer: Self-pay | Admitting: Internal Medicine

## 2021-11-13 DIAGNOSIS — J9611 Chronic respiratory failure with hypoxia: Secondary | ICD-10-CM

## 2021-11-13 NOTE — Telephone Encounter (Signed)
Prednisone 10 mg # 100    2 daily until better then back to 10 mg daily - if flares then go back to 20 mg daily and start over.

## 2021-11-13 NOTE — Telephone Encounter (Signed)
Called and spoke to patient. He states he was on a  prednisone taper for a short amount of time and is now back on his normal dose of prednisone 20 mg that he takes everyday. He states there are no refills available per pharmacy staff.   Dr. [name] please advise if 20 mg prednisone once a day is okay to refill for patient?   Thanks!   Pharmacy needs to be changed to [facility]

## 2021-11-13 NOTE — Telephone Encounter (Signed)
Called and spoke with patient. He verbalized understanding. I attempted to send in the prednisone but it was already sent in earlier today.   Nothing further needed.

## 2021-11-16 MED ORDER — AZITHROMYCIN 250 MG PO TABS: ORAL_TABLET | ORAL | 11 refills | Status: DC

## 2021-11-16 NOTE — Telephone Encounter (Signed)
See prior entry.

## 2021-11-16 NOTE — Telephone Encounter (Signed)
Called and spoke with patient about medication that Dr [name] ordered and sent 11 refills along with Z pack. Patient had no questions or concerns at the moment. Nothing further needed.

## 2021-11-16 NOTE — Telephone Encounter (Signed)
Called and spoke with pt who states he wants to have a refill of the zpak. Pt said that he has had a lot of tightness in his chest and also states that he has had a lot of mucus that he is not able to get up.  Asked pt if he was currently taking mucinex as that would help with the mucus that he is not able to get up and he stated that he was not currently taking any mucinex as he could not afford it.  Pt said that in the past Dr. [name] has okayed zpak when he has had these symptoms as that is the only thing that has worked in the past.  Dr. [name], please advise on this for pt.

## 2021-11-16 NOTE — Addendum Note (Signed)
Addended by: Monna Fam L on: 11/16/2021 11:20 AM   Modules accepted: Orders

## 2021-11-16 NOTE — Telephone Encounter (Signed)
Ok to rx zpak  and refill x 11 prn cough/ congestion to save him a phone call since that's what I am going to rec anyway

## 2021-11-20 ENCOUNTER — Ambulatory Visit (HOSPITAL_COMMUNITY): Payer: Medicaid Other | Admitting: Physical Therapy

## 2021-11-26 ENCOUNTER — Emergency Department (HOSPITAL_COMMUNITY): Payer: Medicaid Other

## 2021-11-26 ENCOUNTER — Encounter (HOSPITAL_COMMUNITY): Payer: Self-pay | Admitting: *Deleted

## 2021-11-26 ENCOUNTER — Other Ambulatory Visit: Payer: Self-pay

## 2021-11-26 ENCOUNTER — Emergency Department (HOSPITAL_COMMUNITY)
Admission: EM | Admit: 2021-11-26 | Discharge: 2021-11-26 | Disposition: A | Discharge status: Home / Self Care | Payer: Medicaid Other | Attending: Emergency Medicine | Admitting: Emergency Medicine

## 2021-11-26 DIAGNOSIS — Z79899 Other long term (current) drug therapy: Secondary | ICD-10-CM | POA: Insufficient documentation

## 2021-11-26 DIAGNOSIS — F17200 Nicotine dependence, unspecified, uncomplicated: Secondary | ICD-10-CM | POA: Diagnosis not present

## 2021-11-26 DIAGNOSIS — R7309 Other abnormal glucose: Secondary | ICD-10-CM | POA: Insufficient documentation

## 2021-11-26 DIAGNOSIS — E876 Hypokalemia: Secondary | ICD-10-CM | POA: Diagnosis not present

## 2021-11-26 DIAGNOSIS — J441 Chronic obstructive pulmonary disease with (acute) exacerbation: Secondary | ICD-10-CM | POA: Diagnosis not present

## 2021-11-26 DIAGNOSIS — R0602 Shortness of breath: Secondary | ICD-10-CM | POA: Diagnosis present

## 2021-11-26 LAB — BASIC METABOLIC PANEL
Anion gap: 8 (ref 5–15)
BUN: 12 mg/dL (ref 6–20)
CO2: 30 mmol/L (ref 22–32)
Calcium: 8.9 mg/dL (ref 8.9–10.3)
Chloride: 100 mmol/L (ref 98–111)
Creatinine, Ser: 0.92 mg/dL (ref 0.61–1.24)
GFR, Estimated: >60 mL/min (ref >60)
Glucose, Bld: 116 mg/dL — ABNORMAL HIGH (ref 70–99)
Potassium: 3.3 mmol/L — ABNORMAL LOW (ref 3.5–5.1)
Sodium: 138 mmol/L (ref 135–145)

## 2021-11-26 LAB — TROPONIN I (HIGH SENSITIVITY)
Troponin I (High Sensitivity): 10 ng/L (ref <18)
Troponin I (High Sensitivity): 10 ng/L (ref <18)

## 2021-11-26 LAB — CBC WITH DIFFERENTIAL/PLATELET
Abs Immature Granulocytes: 0.1 10*3/uL — ABNORMAL HIGH (ref 0.00–0.07)
Basophils Absolute: 0 10*3/uL (ref 0.0–0.1)
Basophils Relative: 0 %
Eosinophils Absolute: 0 10*3/uL (ref 0.0–0.5)
Eosinophils Relative: 0 %
HCT: 44.4 % (ref 39.0–52.0)
Hemoglobin: 14.3 g/dL (ref 13.0–17.0)
Immature Granulocytes: 1 %
Lymphocytes Relative: 9 %
Lymphs Abs: 0.9 10*3/uL (ref 0.7–4.0)
MCH: 32 pg (ref 26.0–34.0)
MCHC: 32.2 g/dL (ref 30.0–36.0)
MCV: 99.3 fL (ref 80.0–100.0)
Monocytes Absolute: 0.6 10*3/uL (ref 0.1–1.0)
Monocytes Relative: 5 %
Neutro Abs: 8.5 10*3/uL — ABNORMAL HIGH (ref 1.7–7.7)
Neutrophils Relative %: 85 %
Platelets: 255 10*3/uL (ref 150–400)
RBC: 4.47 MIL/uL (ref 4.22–5.81)
RDW: 14.9 % (ref 11.5–15.5)
WBC: 10.1 10*3/uL (ref 4.0–10.5)
nRBC: 0 % (ref 0.0–0.2)

## 2021-11-26 IMAGING — DX DG CHEST 1V PORT
2 series · 2 of 2 positions shown · non-contrast
Comparison: [DATE]

CLINICAL DATA: Shortness of breath and chest tightness.

EXAM:
PORTABLE CHEST 1 VIEW

[chest ap (1 of 2)]
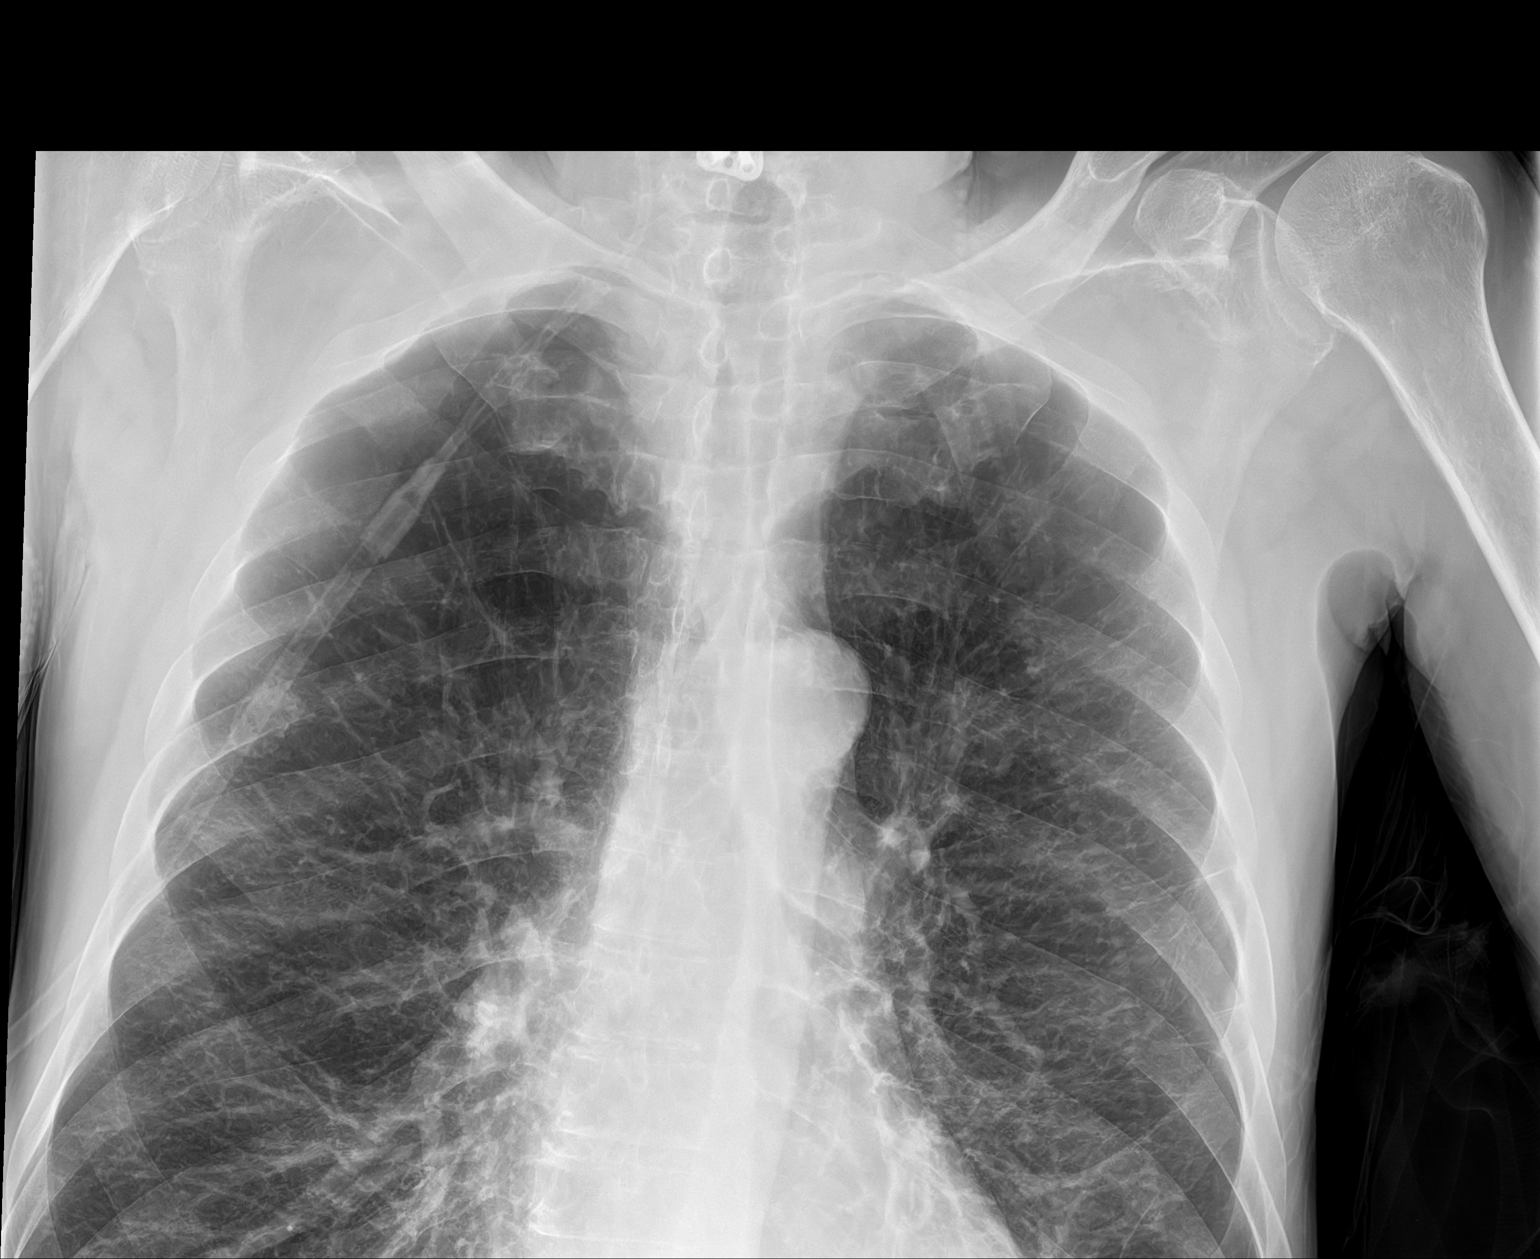

[chest ap (2 of 2)]
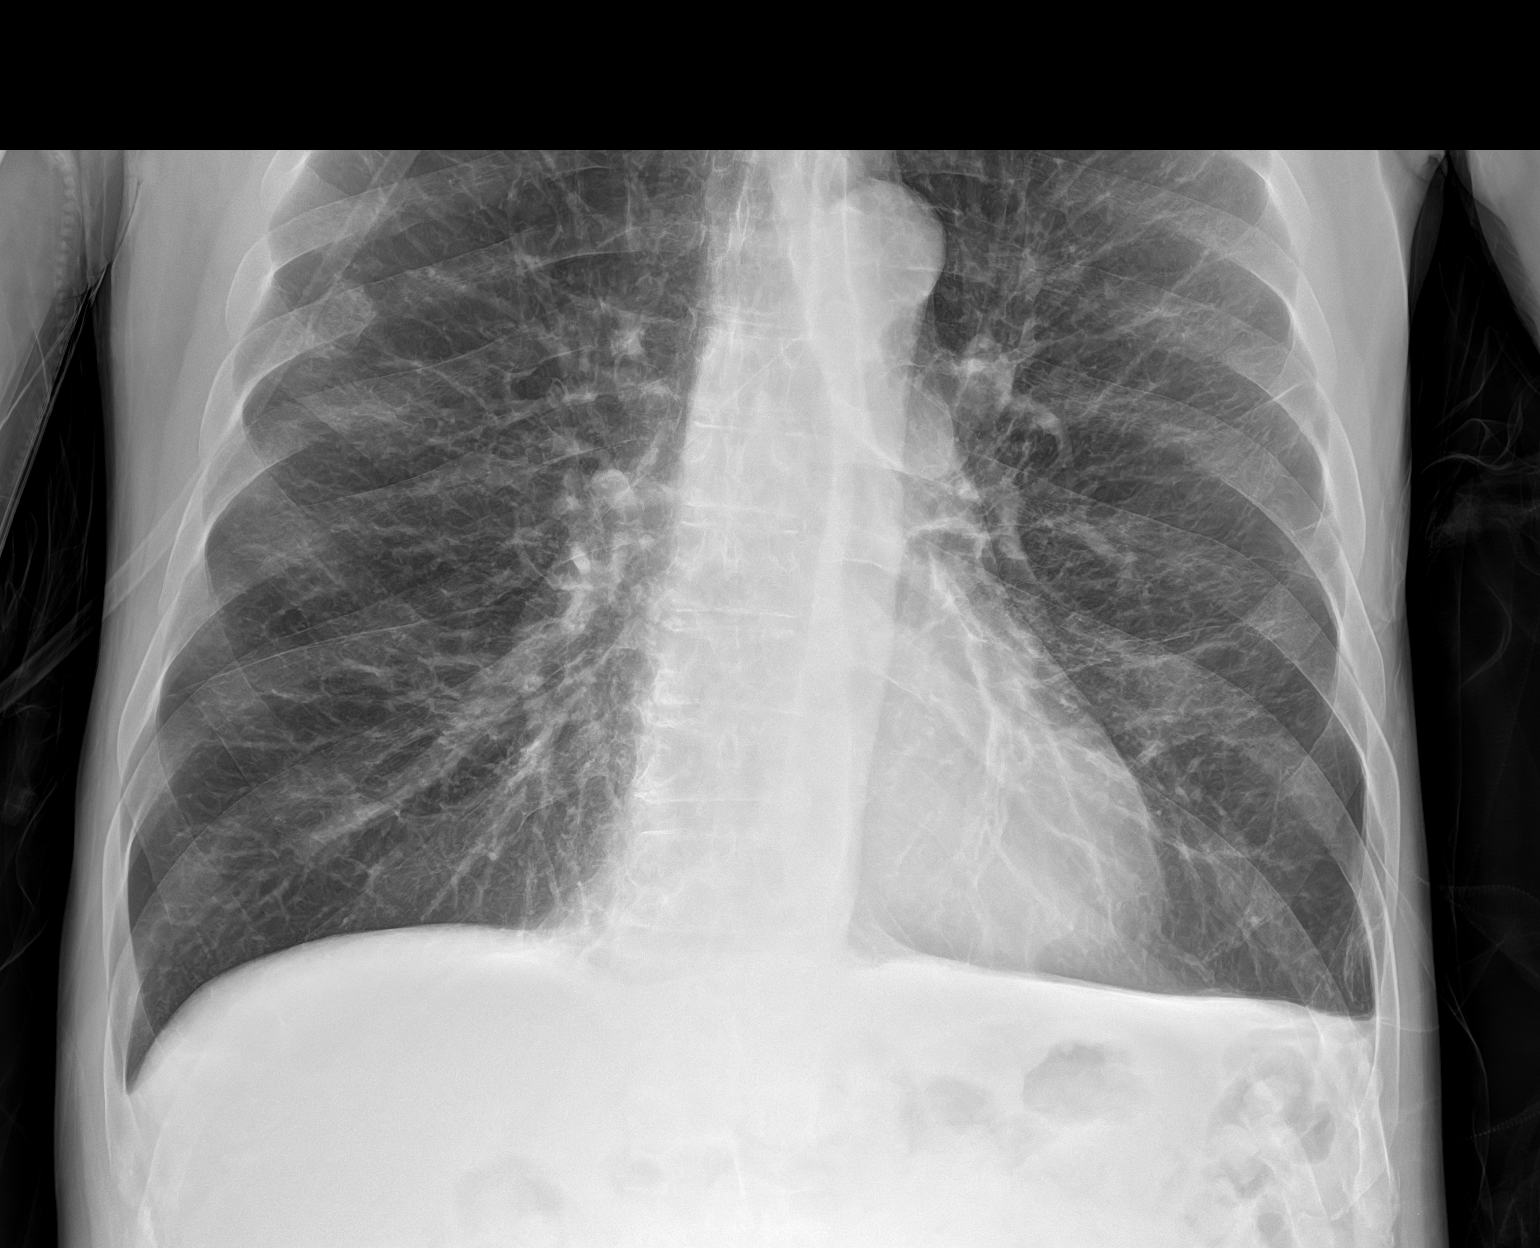

[2 of 2 positions shown; findings below may reference images not displayed]

FINDINGS: Normal heart size. No pleural effusion or edema. Lungs are
hyperinflated with coarsened interstitial markings of emphysema.
Remote healed right posterior sixth rib fracture. No acute osseous
findings.
IMPRESSION: No acute cardiopulmonary abnormalities.

Emphysema ([5A]-[5A]).

## 2021-11-26 MED ORDER — IPRATROPIUM-ALBUTEROL 0.5-2.5 (3) MG/3ML IN SOLN
3.0000 mL | Freq: Once | RESPIRATORY_TRACT | Status: AC
  Administered 2021-11-26: 3 mL via RESPIRATORY_TRACT
  Filled 2021-11-26: qty 3

## 2021-11-26 MED ORDER — ALBUTEROL SULFATE HFA 108 (90 BASE) MCG/ACT IN AERS
2.0000 | INHALATION_SPRAY | Freq: Once | RESPIRATORY_TRACT | Status: AC
  Administered 2021-11-26: 2 via RESPIRATORY_TRACT

## 2021-11-26 MED ORDER — ALBUTEROL SULFATE HFA 108 (90 BASE) MCG/ACT IN AERS
INHALATION_SPRAY | RESPIRATORY_TRACT | Status: AC
  Filled 2021-11-26: qty 6.7

## 2021-11-26 MED ORDER — DOXYCYCLINE HYCLATE 100 MG PO CAPS: 100.0000 mg | ORAL_CAPSULE | Freq: Two times a day (BID) | ORAL | 0 refills | Status: DC

## 2021-11-26 MED ORDER — PREDNISONE 20 MG PO TABS
20.0000 mg | ORAL_TABLET | Freq: Once | ORAL | Status: AC
  Administered 2021-11-26: 20 mg via ORAL
  Filled 2021-11-26: qty 1

## 2021-11-26 MED ORDER — ALBUTEROL SULFATE HFA 108 (90 BASE) MCG/ACT IN AERS
2.0000 | INHALATION_SPRAY | Freq: Four times a day (QID) | RESPIRATORY_TRACT | 2 refills | Status: DC | PRN
Start: 2021-11-26 — End: 2021-12-12

## 2021-11-26 NOTE — ED Notes (Signed)
Pt reports feeling much better, no respiratory distress, requesting discharge home, provider made aware.

## 2021-11-26 NOTE — ED Notes (Signed)
Pt states mid chest tightness since yesterday.

## 2021-11-26 NOTE — ED Triage Notes (Addendum)
Pt requesting a neb treatment once entering triage room.  States his neb machine not working and lost his rescue inhaler.  Pt is on home O2, hx of COPD.

## 2021-11-26 NOTE — ED Provider Notes (Signed)
[facility] Provider Note   CSN: [id number] Arrival date & time: 11/26/21  1300     History  Chief Complaint  Patient presents with   Shortness of Breath    Tony Sullivan is a 61 y.o. male with h/o COPD, chronic respiratory failure on at home 3 to 4 L nasal cannula, tobacco smoker presents the emergency department for evaluation of shortness of breath, intermittent chest pain, and cough for the past 2 days.  Patient reports that his nebulizer has been broken for the past few days and he should be getting a new one in the mail today.  He is unable to give himself some nebulizer treatments and is requesting a treatment now.  He reports that he has chest pain he thinks is just from his shortness of breath and cough as he gets this often.  He is currently on 20 mg of prednisone every day for the past year.  He reports he did not take his dose this morning.  He denies any syncope, lightheadedness, hemoptysis, or fever.   Shortness of Breath Associated symptoms: chest pain and cough   Associated symptoms: no fever       Home Medications Prior to Admission medications   Medication Sig Start Date End Date Taking? Authorizing Provider  acetaminophen (TYLENOL) 325 MG tablet Take 2 tablets (650 mg total) by mouth every 6 (six) hours as needed for mild pain (or Fever >/= 101). 10/30/21   [name], MD  albuterol (PROVENTIL) (2.5 MG/3ML) 0.083% nebulizer solution Take 3 mLs (2.5 mg total) by nebulization every 4 (four) hours as needed for wheezing or shortness of breath. 10/30/21 10/30/22  [name], MD  albuterol (VENTOLIN HFA) 108 (90 Base) MCG/ACT inhaler Inhale 2 puffs into the lungs every 6 (six) hours as needed for wheezing or shortness of breath. 10/30/21   [name], MD  amLODipine (NORVASC) 10 MG tablet Take 1 tablet (10 mg total) by mouth daily. For BP 10/31/21   Emokpae, Courage, MD  atorvastatin (LIPITOR) 40 MG tablet TAKE ONE TABLET BY MOUTH  DAILY Patient taking differently: Take 40 mg by mouth daily. 07/06/20   [name], MD  azithromycin (ZITHROMAX) 250 MG tablet Take as directed 11/16/21   [name], MD  benztropine (COGENTIN) 0.5 MG tablet Take 0.5 mg by mouth at bedtime.  Patient not taking: Reported on 10/28/2021    [provider]  Budeson-Glycopyrrol-Formoterol (BREZTRI AEROSPHERE) 160-9-4.8 MCG/ACT AERO Inhale 2 puffs into the lungs in the morning and at bedtime. 10/30/21   [name], MD  budesonide-formoterol (SYMBICORT) 160-4.5 MCG/ACT inhaler Inhale 2 puffs into the lungs 2 (two) times daily. 10/30/21   [name], MD  cetirizine (ZYRTEC) 10 MG tablet Take 10 mg by mouth daily.    [provider]  dextromethorphan-guaiFENesin (MUCINEX DM) 30-600 MG 12hr tablet Take 1 tablet by mouth 2 (two) times daily. 10/30/21   [name], MD  diclofenac Sodium (VOLTAREN) 1 % GEL Apply 2 g topically 4 (four) times daily.    [provider]  dorzolamide (TRUSOPT) 2 % ophthalmic solution Place 1 drop into the left eye 2 (two) times daily. 12/12/20   [provider]  hydrochlorothiazide (HYDRODIURIL) 25 MG tablet Take 0.5 tablets (12.5 mg total) by mouth daily. 10/30/21   [name], MD  hydrOXYzine (ATARAX) 25 MG tablet Take 1 tablet (25 mg total) by mouth 3 (three) times daily as needed for anxiety or nausea. 10/30/21   Emokpae, Courage,  MD  latanoprost (XALATAN) 0.005 % ophthalmic solution Place 1 drop into both eyes at bedtime. 05/24/19   [provider]  loxapine (LOXITANE) 10 MG capsule Take 10 mg by mouth at bedtime.     [provider]  megestrol (MEGACE) 40 MG tablet Take 40 mg by mouth daily.    [provider]  mirtazapine (REMERON) 30 MG tablet Take 1 tablet (30 mg total) by mouth at bedtime. 10/30/21   [name], MD  Multiple Vitamin (MULTIVITAMIN WITH MINERALS) TABS tablet Take 1 tablet by mouth daily. 10/31/21   [name], MD   nicotine (NICODERM CQ - DOSED IN MG/24 HOURS) 21 mg/24hr patch Place 1 patch (21 mg total) onto the skin daily for 28 days. 10/30/21 11/27/21  [name], MD  OXYGEN Inhale 3-4 L into the lungs as directed. 3-4 liters with sleep and exertion as needed for low oxygen    [provider]  pantoprazole (PROTONIX) 40 MG tablet Take 1 tablet (40 mg total) by mouth daily. 10/30/21   [name], MD  polyethylene glycol (MIRALAX / GLYCOLAX) 17 g packet TAKE 17 GRAMS BY MOUTH DAILY Patient taking differently: Take 17 g by mouth daily as needed for mild constipation. 03/09/20   [name], MD  predniSONE (DELTASONE) 10 MG tablet TAKE 1 TABLET(10 MG) BY MOUTH DAILY 11/13/21   [name], MD  risperiDONE (RISPERDAL) 2 MG tablet Take 1 tablet (2 mg total) by mouth at bedtime. 10/30/21   [name], MD  senna-docusate (SENOKOT-S) 8.6-50 MG tablet Take 2 tablets by mouth at bedtime. 10/30/21   [name], MD  simethicone (MYLICON) 40 VO/3.5KK drops Take 0.6 mLs (40 mg total) by mouth 4 (four) times daily as needed for flatulence. 10/17/21   Orpah Greek, MD      Allergies    Penicillins and Levocetirizine    Review of Systems   Review of Systems  Constitutional:  Negative for fever.  Respiratory:  Positive for cough and shortness of breath.   Cardiovascular:  Positive for chest pain. Negative for palpitations.  Neurological:  Negative for light-headedness.   Physical Exam Updated Vital Signs BP 130/86    Pulse 81    Temp 98.4 F (36.9 C) (Oral)    Resp 17    Ht 5\' 11"  (1.803 m)    Wt 70.3 kg    SpO2 95%    BMI 21.62 kg/m  Physical Exam Vitals and nursing note reviewed.  Constitutional:      General: He is not in acute distress.    Appearance: Normal appearance. He is not toxic-appearing.  HENT:     Head: Normocephalic and atraumatic.  Eyes:     General: No scleral icterus. Cardiovascular:     Rate and Rhythm: Normal rate and regular rhythm.  Pulmonary:      Effort: Pulmonary effort is normal.     Comments: Diminished breath sounds throughout.  No rhonchi or rales auscultated.  No respiratory distress, accessory muscle use, tripoding, nasal flaring, or cyanosis present.  Patient satting 98% on his at home 3 L nasal cannula.  He is speaking full sentences with ease. Abdominal:     General: Abdomen is flat. Bowel sounds are normal.     Palpations: Abdomen is soft.  Musculoskeletal:        General: No deformity.     Cervical back: Normal range of motion.  Skin:    General: Skin is warm and dry.  Neurological:  General: No focal deficit present.     Mental Status: He is alert. Mental status is at baseline.    ED Results / Procedures / Treatments   Labs (all labs ordered are listed, but only abnormal results are displayed) Labs Reviewed  CBC WITH DIFFERENTIAL/PLATELET - Abnormal; Notable for the following components:      Result Value   Neutro Abs 8.5 (*)    Abs Immature Granulocytes 0.10 (*)    All other components within normal limits  BASIC METABOLIC PANEL - Abnormal; Notable for the following components:   Potassium 3.3 (*)    Glucose, Bld 116 (*)    All other components within normal limits  TROPONIN I (HIGH SENSITIVITY)  TROPONIN I (HIGH SENSITIVITY)    EKG EKG Interpretation  Date/Time:  Monday November 26 2021 13:43:43 EST Ventricular Rate:  87 PR Interval:  162 QRS Duration: 80 QT Interval:  378 QTC Calculation: 455 R Axis:   -82 Text Interpretation: Sinus arrhythmia Left anterior fascicular block Confirmed by Godfrey Pick [phone]) on 11/26/2021 3:51:32 PM  Radiology DG Chest Portable 1 View  Result Date: 11/26/2021 CLINICAL DATA:  Shortness of breath and chest tightness. EXAM: PORTABLE CHEST 1 VIEW COMPARISON:  10/27/2021 FINDINGS: Normal heart size. No pleural effusion or edema. Lungs are hyperinflated with coarsened interstitial markings of emphysema. Remote healed right posterior sixth rib fracture. No acute  osseous findings. IMPRESSION: No acute cardiopulmonary abnormalities. Emphysema (ICD10-J43.9). Electronically Signed   By: Kerby Moors M.D.   On: 11/26/2021 13:39    Procedures Procedures   Medications Ordered in ED Medications  albuterol (VENTOLIN HFA) 108 (90 Base) MCG/ACT inhaler (  Not Given 11/26/21 1420)  ipratropium-albuterol (DUONEB) 0.5-2.5 (3) MG/3ML nebulizer solution 3 mL (3 mLs Nebulization Given 11/26/21 1348)  predniSONE (DELTASONE) tablet 20 mg (20 mg Oral Given 11/26/21 1348)  albuterol (VENTOLIN HFA) 108 (90 Base) MCG/ACT inhaler 2 puff (2 puffs Inhalation Given 11/26/21 1420)  ipratropium-albuterol (DUONEB) 0.5-2.5 (3) MG/3ML nebulizer solution 3 mL (3 mLs Nebulization Given 11/26/21 1454)    ED Course/ Medical Decision Making/ A&P                           Medical Decision Making Amount and/or Complexity of Data Reviewed Labs: ordered. Radiology: ordered.  Risk Prescription drug management.   61 year old male presents emergency department for evaluation of shortness of breath, cough, and chest pain for the past 2 days.  Differential diagnosis includes was not limited to ACS, COPD exacerbation, pneumonia, PE.  Vital signs are unremarkable.  Patient is satting 98% on at home nasal cannula.  Normotensive, afebrile.  No tachypnea.  Physical exam shows some diminished breath sounds throughout without rhonchi or rales auscultated.  Pulses intact throughout.  Heart rate regular without any murmurs auscultated.  This patient is likely experiencing a COPD exacerbation due to him not being able to access his nebulizer machine.  Labs and imaging ordered.  I independently reviewed and interpreted the patient's labs and imaging.  BMP shows mildly decreased potassium at 3.3 and mildly elevated glucose at 116, otherwise no electrolyte abnormality.  CBC shows no leukocytosis, however the patient does have a left shift.  Patient is on daily prednisone which may explain this.  Initial  troponin of 10 with repeat of 10, delta 0.  Chest x-ray shows emphysema without any acute cardiopulmonary process.  EKG shows no STEMI.  Sinus arrhythmia with left anterior fascicular block.  For medications, patient  was given albuterol along with 2 duo nebs and his daily prednisone that he missed today.  On reevaluation, patient sitting comfortably in bed watching television and reports he is feeling much better after his nebulizer treatments.  The patient reports he should be getting his nebulizer machine in the mail today from his medical supplier.  He reports he also lost his rescue inhaler.    Given the negative troponins, low decision for ACS.  Chest x-ray not show any signs of pneumonia.  Doubt PE at this time given the patient's known history of COPD and relief with nebulizer treatments.  Given the patient's COPD, consistent tobacco use, will provide patient with doxycycline to treat for COPD exacerbation as well as send in a refill of his albuterol inhaler since he lost his.  Strict return precautions discussed with him.  Patient agrees with plan.  Patient is stable being discharged home in good condition.  Final Clinical Impression(s) / ED Diagnoses Final diagnoses:  COPD exacerbation ([place])    Rx / DC Orders ED Discharge Orders          Ordered    doxycycline (VIBRAMYCIN) 100 MG capsule  2 times daily        11/26/21 1634    albuterol (VENTOLIN HFA) 108 (90 Base) MCG/ACT inhaler  Every 6 hours PRN        11/26/21 1634              Sherrell Puller, PA-C 11/28/21 1624    Godfrey Pick, MD 11/30/21 Vernelle Emerald

## 2021-11-26 NOTE — ED Notes (Signed)
Provider at bedside

## 2021-11-26 NOTE — Discharge Instructions (Addendum)
You were seen here today for evaluation of you shortness of breath and chest pain. You are leaving against medical advice before we can get the rest of your labs back. You have accepted the risks including heart attack and/or death. If you are experiencing chest pain, please return to the ER immediately.  I have prescribed doxycycline, an antibiotic to take as needed for your COPD exacerbation.  I have also refilled your albuterol inhaler that she lost.  Please see your PCP in a few days for reevaluation of your condition.  If you continue to have any shortness of breath, chest pain, diaphoresis, or nausea, please return the nearest emergency department for evaluation.

## 2021-11-27 ENCOUNTER — Encounter (HOSPITAL_COMMUNITY): Payer: Self-pay | Admitting: Emergency Medicine

## 2021-11-27 ENCOUNTER — Other Ambulatory Visit: Payer: Self-pay

## 2021-11-27 ENCOUNTER — Emergency Department (HOSPITAL_COMMUNITY): Payer: Medicaid Other

## 2021-11-27 ENCOUNTER — Emergency Department (HOSPITAL_COMMUNITY)
Admission: EM | Admit: 2021-11-27 | Discharge: 2021-11-27 | Disposition: A | Discharge status: Home / Self Care | Payer: Medicaid Other | Attending: Emergency Medicine | Admitting: Emergency Medicine

## 2021-11-27 DIAGNOSIS — Z7951 Long term (current) use of inhaled steroids: Secondary | ICD-10-CM | POA: Diagnosis not present

## 2021-11-27 DIAGNOSIS — F17210 Nicotine dependence, cigarettes, uncomplicated: Secondary | ICD-10-CM | POA: Diagnosis not present

## 2021-11-27 DIAGNOSIS — J441 Chronic obstructive pulmonary disease with (acute) exacerbation: Secondary | ICD-10-CM | POA: Insufficient documentation

## 2021-11-27 DIAGNOSIS — R0789 Other chest pain: Secondary | ICD-10-CM | POA: Diagnosis present

## 2021-11-27 LAB — CBC WITH DIFFERENTIAL/PLATELET
Abs Immature Granulocytes: 0.09 10*3/uL — ABNORMAL HIGH (ref 0.00–0.07)
Basophils Absolute: 0 10*3/uL (ref 0.0–0.1)
Basophils Relative: 0 %
Eosinophils Absolute: 0 10*3/uL (ref 0.0–0.5)
Eosinophils Relative: 0 %
HCT: 44.2 % (ref 39.0–52.0)
Hemoglobin: 14.2 g/dL (ref 13.0–17.0)
Immature Granulocytes: 1 %
Lymphocytes Relative: 6 %
Lymphs Abs: 0.7 10*3/uL (ref 0.7–4.0)
MCH: 32.1 pg (ref 26.0–34.0)
MCHC: 32.1 g/dL (ref 30.0–36.0)
MCV: 99.8 fL (ref 80.0–100.0)
Monocytes Absolute: 0.7 10*3/uL (ref 0.1–1.0)
Monocytes Relative: 6 %
Neutro Abs: 9.9 10*3/uL — ABNORMAL HIGH (ref 1.7–7.7)
Neutrophils Relative %: 87 %
Platelets: 244 10*3/uL (ref 150–400)
RBC: 4.43 MIL/uL (ref 4.22–5.81)
RDW: 14.9 % (ref 11.5–15.5)
WBC: 11.4 10*3/uL — ABNORMAL HIGH (ref 4.0–10.5)
nRBC: 0 % (ref 0.0–0.2)

## 2021-11-27 LAB — BASIC METABOLIC PANEL
Anion gap: 8 (ref 5–15)
BUN: 14 mg/dL (ref 6–20)
CO2: 30 mmol/L (ref 22–32)
Calcium: 9.1 mg/dL (ref 8.9–10.3)
Chloride: 99 mmol/L (ref 98–111)
Creatinine, Ser: 0.89 mg/dL (ref 0.61–1.24)
GFR, Estimated: >60 mL/min (ref >60)
Glucose, Bld: 99 mg/dL (ref 70–99)
Potassium: 3.1 mmol/L — ABNORMAL LOW (ref 3.5–5.1)
Sodium: 137 mmol/L (ref 135–145)

## 2021-11-27 LAB — TROPONIN I (HIGH SENSITIVITY): Troponin I (High Sensitivity): 9 ng/L (ref <18)

## 2021-11-27 LAB — D-DIMER, QUANTITATIVE: D-Dimer, Quant: 0.29 ug/mL-FEU (ref 0.00–0.50)

## 2021-11-27 IMAGING — DX DG CHEST 1V
2 series · 2 of 2 positions shown · non-contrast
Comparison: Chest x-ray [DATE].  Chest CT [DATE]

CLINICAL DATA: Short of breath.  COPD.

EXAM:
CHEST  1 VIEW

[chest ap (1 of 2)]
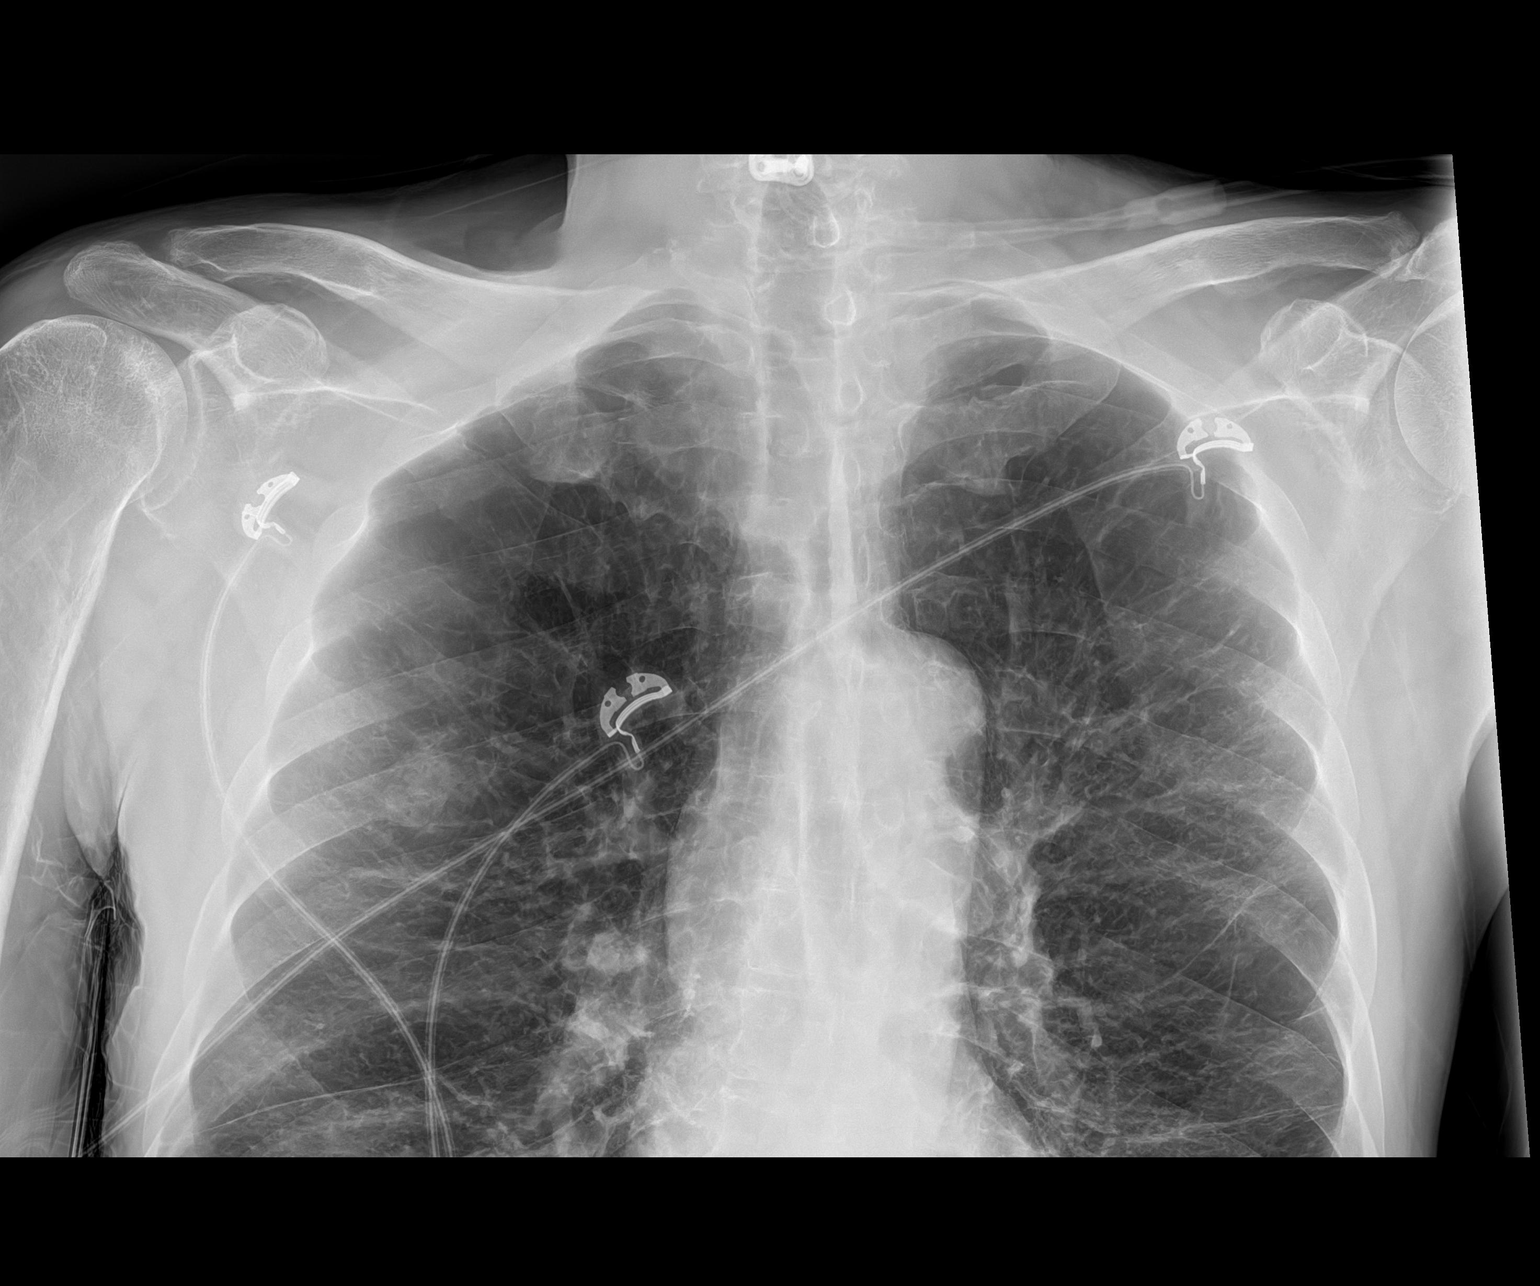

[chest ap (2 of 2)]
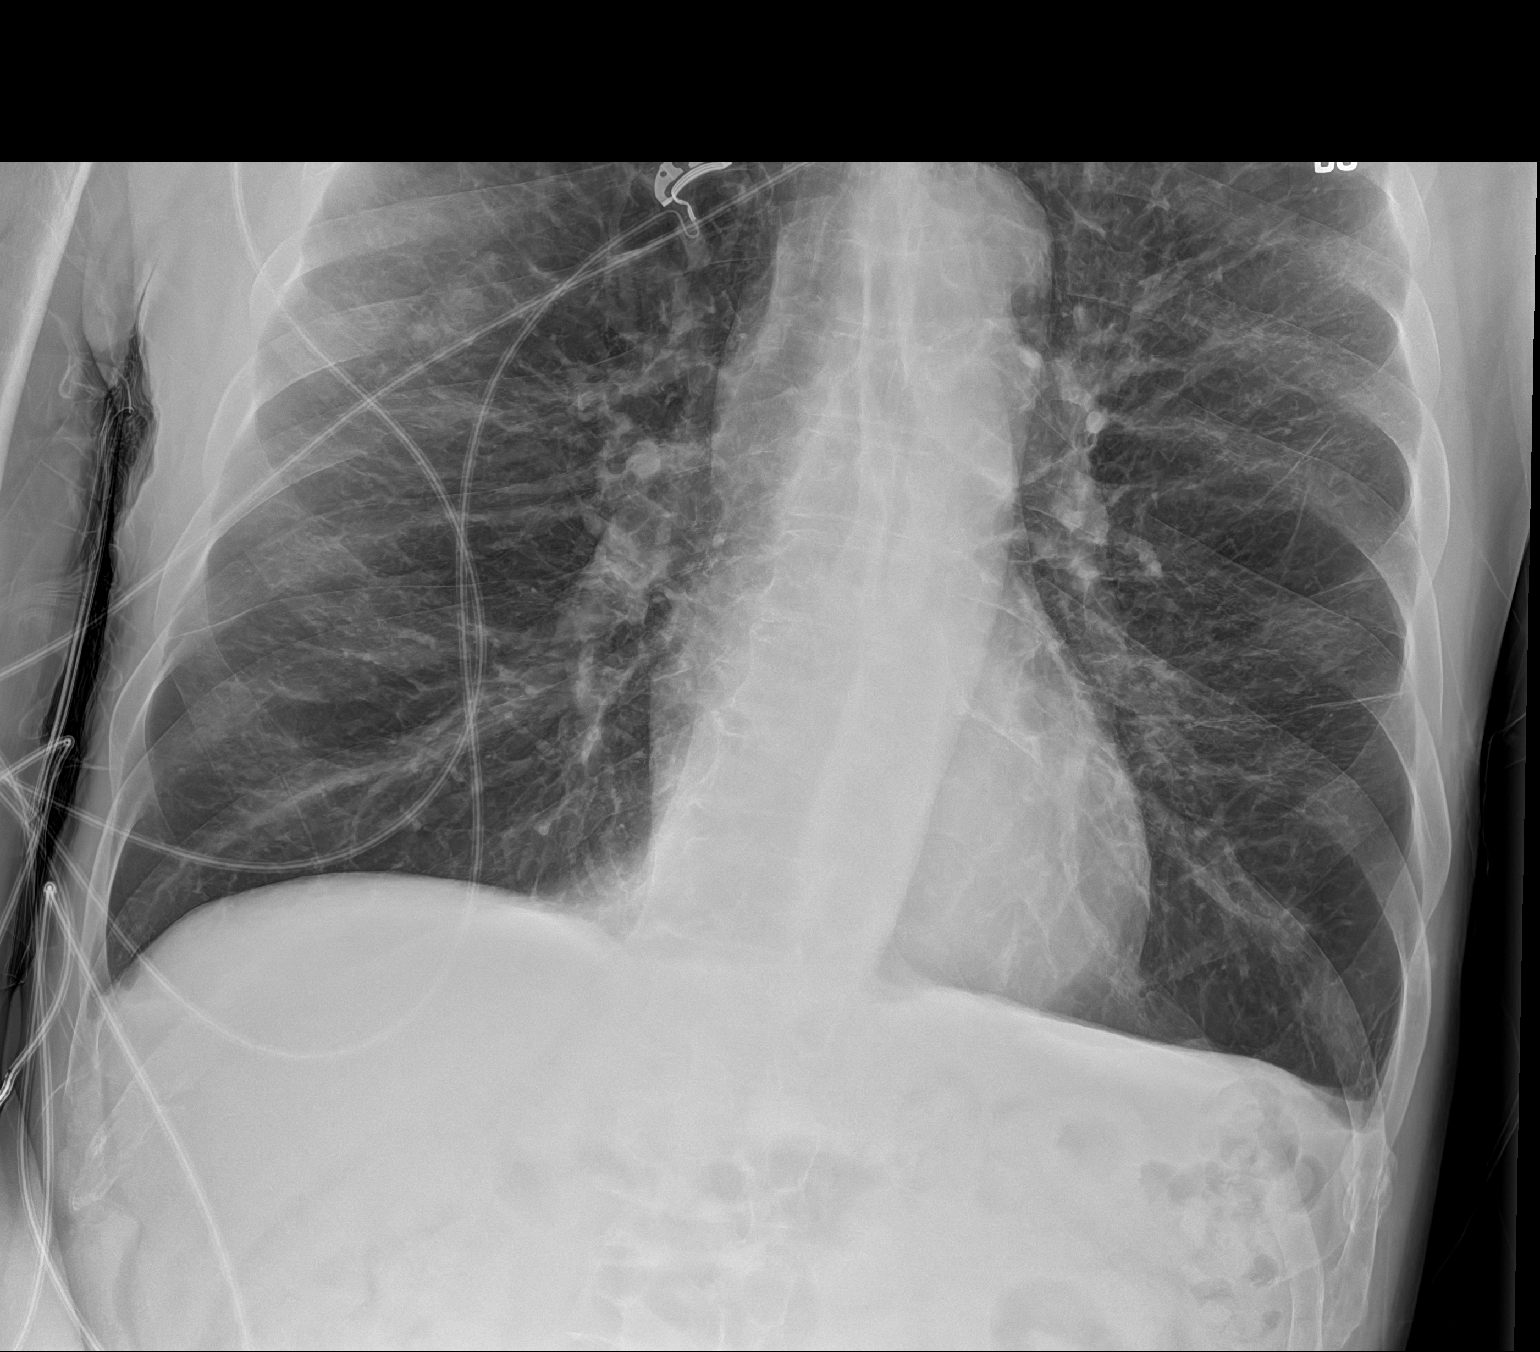

[2 of 2 positions shown; findings below may reference images not displayed]

FINDINGS: COPD with hyperinflation and emphysema. Negative for pneumonia.
Heart size and vascularity normal. Negative for edema. Blunting left
costophrenic angle compatible with minimal pleural effusion

15 mm density right upper lobe unchanged. This may be a healing rib
fracture as noted on prior CT chest. ACDF cervical spine
IMPRESSION: Advanced COPD without pneumonia

Small left effusion

Right upper lobe density consistent with healing rib fracture.

## 2021-11-27 MED ORDER — ALBUTEROL (5 MG/ML) CONTINUOUS INHALATION SOLN: 5.0000 mg/h | INHALATION_SOLUTION | Freq: Once | RESPIRATORY_TRACT | Status: DC

## 2021-11-27 MED ORDER — ALBUTEROL (5 MG/ML) CONTINUOUS INHALATION SOLN
10.0000 mg/h | INHALATION_SOLUTION | RESPIRATORY_TRACT | Status: DC
  Administered 2021-11-27: 10 mg/h via RESPIRATORY_TRACT

## 2021-11-27 MED ORDER — DOXYCYCLINE HYCLATE 100 MG PO TABS
100.0000 mg | ORAL_TABLET | Freq: Once | ORAL | Status: AC
  Administered 2021-11-27: 100 mg via ORAL
  Filled 2021-11-27: qty 1

## 2021-11-27 MED ORDER — ALBUTEROL SULFATE (2.5 MG/3ML) 0.083% IN NEBU
INHALATION_SOLUTION | RESPIRATORY_TRACT | Status: AC
  Filled 2021-11-27: qty 12

## 2021-11-27 MED ORDER — ALBUTEROL SULFATE HFA 108 (90 BASE) MCG/ACT IN AERS
2.0000 | INHALATION_SPRAY | Freq: Once | RESPIRATORY_TRACT | Status: AC
Start: 2021-11-27 — End: 2021-11-27
  Administered 2021-11-27: 2 via RESPIRATORY_TRACT
  Filled 2021-11-27: qty 6.7

## 2021-11-27 NOTE — ED Provider Notes (Signed)
[facility] EMERGENCY DEPARTMENT Provider Note   CSN: [id number] Arrival date & time: 11/27/21  1128     History  Chief Complaint  Patient presents with   Chest Pain   Cough   Shortness of Breath    Tony Sullivan is a 61 y.o. male.  HPI  Medical history including COPD chronic respiratory failure on 3 to 4 L via nasal cannula tobacco dependency GERD presents to the emerged part with complaints of chest tightness.  Patient states start about a few days ago, states she has increased sputum production but denies nasal congestion sore throat fevers, chills, stomach pains nausea vomiting diarrhea general body aches.  Does endorse that he has some chest pain's and the middle of his chest feels like a pressure-like sensation does not radiate no pleuritic chest pain states that this is the same pain he was having from yesterday but feels slightly worse continues to smoke but is down to 1 cigarette a day, states that his nebulizer had broke and is waiting for you into the command, he was given antibiotics but is unable to pick them up yesterday currently on steroids take 20 mg daily.  Denies any alleviating or aggravating factors.  After reviewing patient's chart was here yesterday for similar presentation and was given continuous neb and steroids and discharged home with doxycycline.  Home Medications Prior to Admission medications   Medication Sig Start Date End Date Taking? Authorizing Provider  acetaminophen (TYLENOL) 325 MG tablet Take 2 tablets (650 mg total) by mouth every 6 (six) hours as needed for mild pain (or Fever >/= 101). 10/30/21   [name], MD  albuterol (PROVENTIL) (2.5 MG/3ML) 0.083% nebulizer solution Take 3 mLs (2.5 mg total) by nebulization every 4 (four) hours as needed for wheezing or shortness of breath. 10/30/21 10/30/22  [name], MD  albuterol (VENTOLIN HFA) 108 (90 Base) MCG/ACT inhaler Inhale 2 puffs into the lungs every 6 (six) hours as needed for  wheezing or shortness of breath. 11/26/21   Sherrell Puller, PA-C  amLODipine (NORVASC) 10 MG tablet Take 1 tablet (10 mg total) by mouth daily. For BP 10/31/21   Emokpae, Courage, MD  atorvastatin (LIPITOR) 40 MG tablet TAKE ONE TABLET BY MOUTH DAILY Patient taking differently: Take 40 mg by mouth daily. 07/06/20   [name], MD  azithromycin (ZITHROMAX) 250 MG tablet Take as directed 11/16/21   [name], MD  benztropine (COGENTIN) 0.5 MG tablet Take 0.5 mg by mouth at bedtime.  Patient not taking: Reported on 10/28/2021    [provider]  Budeson-Glycopyrrol-Formoterol (BREZTRI AEROSPHERE) 160-9-4.8 MCG/ACT AERO Inhale 2 puffs into the lungs in the morning and at bedtime. 10/30/21   [name], MD  budesonide-formoterol (SYMBICORT) 160-4.5 MCG/ACT inhaler Inhale 2 puffs into the lungs 2 (two) times daily. 10/30/21   [name], MD  cetirizine (ZYRTEC) 10 MG tablet Take 10 mg by mouth daily.    [provider]  dextromethorphan-guaiFENesin (MUCINEX DM) 30-600 MG 12hr tablet Take 1 tablet by mouth 2 (two) times daily. 10/30/21   [name], MD  diclofenac Sodium (VOLTAREN) 1 % GEL Apply 2 g topically 4 (four) times daily.    [provider]  dorzolamide (TRUSOPT) 2 % ophthalmic solution Place 1 drop into the left eye 2 (two) times daily. 12/12/20   [provider]  doxycycline (VIBRAMYCIN) 100 MG capsule Take 1 capsule (100 mg total) by mouth 2 (two) times daily. 11/26/21   Sherrell Puller, PA-C  hydrochlorothiazide (HYDRODIURIL) 25 MG tablet Take 0.5 tablets (12.5 mg total) by mouth daily. 10/30/21   [name], MD  hydrOXYzine (ATARAX) 25 MG tablet Take 1 tablet (25 mg total) by mouth 3 (three) times daily as needed for anxiety or nausea. 10/30/21   Emokpae, Courage, MD  latanoprost (XALATAN) 0.005 % ophthalmic solution Place 1 drop into both eyes at bedtime. 05/24/19   [provider]  loxapine (LOXITANE) 10 MG capsule Take 10 mg by mouth  at bedtime.     [provider]  megestrol (MEGACE) 40 MG tablet Take 40 mg by mouth daily.    [provider]  mirtazapine (REMERON) 30 MG tablet Take 1 tablet (30 mg total) by mouth at bedtime. 10/30/21   [name], MD  Multiple Vitamin (MULTIVITAMIN WITH MINERALS) TABS tablet Take 1 tablet by mouth daily. 10/31/21   [name], MD  nicotine (NICODERM CQ - DOSED IN MG/24 HOURS) 21 mg/24hr patch Place 1 patch (21 mg total) onto the skin daily for 28 days. 10/30/21 11/27/21  [name], MD  OXYGEN Inhale 3-4 L into the lungs as directed. 3-4 liters with sleep and exertion as needed for low oxygen    [provider]  pantoprazole (PROTONIX) 40 MG tablet Take 1 tablet (40 mg total) by mouth daily. 10/30/21   [name], MD  polyethylene glycol (MIRALAX / GLYCOLAX) 17 g packet TAKE 17 GRAMS BY MOUTH DAILY Patient taking differently: Take 17 g by mouth daily as needed for mild constipation. 03/09/20   [name], MD  predniSONE (DELTASONE) 10 MG tablet TAKE 1 TABLET(10 MG) BY MOUTH DAILY 11/13/21   [name], MD  risperiDONE (RISPERDAL) 2 MG tablet Take 1 tablet (2 mg total) by mouth at bedtime. 10/30/21   [name], MD  senna-docusate (SENOKOT-S) 8.6-50 MG tablet Take 2 tablets by mouth at bedtime. 10/30/21   [name], MD  simethicone (MYLICON) 40 NO/6.7EH drops Take 0.6 mLs (40 mg total) by mouth 4 (four) times daily as needed for flatulence. 10/17/21   Orpah Greek, MD      Allergies    Penicillins and Levocetirizine    Review of Systems   Review of Systems  Constitutional:  Negative for chills and fever.  Respiratory:  Positive for chest tightness and shortness of breath.   Cardiovascular:  Negative for chest pain.  Gastrointestinal:  Negative for abdominal pain, diarrhea, nausea and vomiting.  Neurological:  Negative for headaches.   Physical Exam Updated Vital Signs BP (!) 140/94    Pulse 90    Temp (!) 97.5 F (36.4  C)    Resp (!) 25    SpO2 99%  Physical Exam Vitals and nursing note reviewed.  Constitutional:      General: He is not in acute distress.    Appearance: He is not ill-appearing.  HENT:     Head: Normocephalic and atraumatic.     Nose: No congestion.     Mouth/Throat:     Mouth: Mucous membranes are moist.     Pharynx: Oropharynx is clear. No oropharyngeal exudate or posterior oropharyngeal erythema.  Eyes:     Conjunctiva/sclera: Conjunctivae normal.  Cardiovascular:     Rate and Rhythm: Normal rate and regular rhythm.     Pulses: Normal pulses.     Heart sounds: No murmur heard.   No friction rub. No gallop.  Pulmonary:     Effort: No respiratory distress.     Breath sounds: Wheezing present.  No rhonchi or rales.     Comments: Patient not showing signs of respiratory distress able speak in full sentences nontachypneic nonhypoxic at his baseline of 3 L via nasal cannula patient tight sounding chest no wheezing rales or rhonchi present. Abdominal:     Palpations: Abdomen is soft.     Tenderness: There is no abdominal tenderness. There is no right CVA tenderness or left CVA tenderness.  Musculoskeletal:     Right lower leg: No edema.     Left lower leg: No edema.  Skin:    General: Skin is warm and dry.  Neurological:     Mental Status: He is alert.     Comments: No facial asymmetry no difficulty with word finding able follow two-step commands no unilateral weakness present.  Psychiatric:        Mood and Affect: Mood normal.    ED Results / Procedures / Treatments   Labs (all labs ordered are listed, but only abnormal results are displayed) Labs Reviewed  BASIC METABOLIC PANEL - Abnormal; Notable for the following components:      Result Value   Potassium 3.1 (*)    All other components within normal limits  CBC WITH DIFFERENTIAL/PLATELET - Abnormal; Notable for the following components:   WBC 11.4 (*)    Neutro Abs 9.9 (*)    Abs Immature Granulocytes 0.09 (*)     All other components within normal limits  D-DIMER, QUANTITATIVE  TROPONIN I (HIGH SENSITIVITY)    EKG None  Radiology DG Chest 1 View  Result Date: 11/27/2021 CLINICAL DATA:  Short of breath.  COPD. EXAM: CHEST  1 VIEW COMPARISON:  Chest x-ray 11/26/2021.  Chest CT 12/18/2020 FINDINGS: COPD with hyperinflation and emphysema. Negative for pneumonia. Heart size and vascularity normal. Negative for edema. Blunting left costophrenic angle compatible with minimal pleural effusion 15 mm density right upper lobe unchanged. This may be a healing rib fracture as noted on prior CT chest. ACDF cervical spine IMPRESSION: Advanced COPD without pneumonia Small left effusion Right upper lobe density consistent with healing rib fracture. Electronically Signed   By: Franchot Gallo M.D.   On: 11/27/2021 13:15   DG Chest Portable 1 View  Result Date: 11/26/2021 CLINICAL DATA:  Shortness of breath and chest tightness. EXAM: PORTABLE CHEST 1 VIEW COMPARISON:  10/27/2021 FINDINGS: Normal heart size. No pleural effusion or edema. Lungs are hyperinflated with coarsened interstitial markings of emphysema. Remote healed right posterior sixth rib fracture. No acute osseous findings. IMPRESSION: No acute cardiopulmonary abnormalities. Emphysema (ICD10-J43.9). Electronically Signed   By: Kerby Moors M.D.   On: 11/26/2021 13:39    Procedures Procedures    Medications Ordered in ED Medications  albuterol (PROVENTIL) (2.5 MG/3ML) 0.083% nebulizer solution (  Canceled Entry 11/27/21 1321)  albuterol (PROVENTIL,VENTOLIN) solution continuous neb (0 mg/hr Nebulization Stopped 11/27/21 1551)  doxycycline (VIBRA-TABS) tablet 100 mg (has no administration in time range)  albuterol (VENTOLIN HFA) 108 (90 Base) MCG/ACT inhaler 2 puff (has no administration in time range)    ED Course/ Medical Decision Making/ A&P                           Medical Decision Making Amount and/or Complexity of Data Reviewed Labs:  ordered. Radiology: ordered.   This patient presents to the ED for concern of shortness breath chest tightness, this involves an extensive number of treatment options, and is a complaint that carries with it a high risk  of complications and morbidity.  The differential diagnosis includes COPD exacerbation, ACS, PE, pneumonia, pneumothorax    Additional history obtained:  Additional history obtained from chart medical record External records from outside source obtained and reviewed including previous discharge   Co morbidities that complicate the patient evaluation  COPD, chronic respiratory failure, tobacco dependence  Social Determinants of Health:  N/A    Lab Tests:  I Ordered, and personally interpreted labs.  The pertinent results include: CBC shows leukocytosis of 11.4 BMP shows potassium 3.1 D-dimer 0.29 for troponin is 9   Imaging Studies ordered:  I ordered imaging studies including chest x-ray I independently visualized and interpreted imaging which showed signs consistent COPD, very small left pleural effusion I agree with the radiologist interpretation   Cardiac Monitoring:  The patient was maintained on a cardiac monitor.  I personally viewed and interpreted the cardiac monitored which showed an underlying rhythm of: EKG sinus without signs of ischemia   Medicines ordered and prescription drug management:  I ordered medication including continuous neb for COPD I have reviewed the patients home medicines and have made adjustments as needed   Reevaluation:  Patient is reassessed after continuous neb ECG feeling better, still slightly tight sounding chest but has improved, no signs respiratory distress able to speak in full sentences, nontachypneic nonhypoxic on 3 L with is normal for him patient as he feels financially for discharge.  Rule out Low suspicion for systemic infection as patient is nontoxic-appearing, vital signs reassuring, no obvious source  infection noted on exam.  Low suspicion for pneumonia as lung sounds are clear bilaterally, x-ray did not reveal any acute findings.  I have low suspicion for PE negative D-dimer, presentation more consistent with COPD exacerbation.  Low suspicion for ACS as she has low risk factors he had a negative delta troponin yesterday, first troponin was 9, Will defer sec troponin as patient having chest pain for greater than 12 hours would expect elevation in troponins if ACS was present EKG without signs of ischemia.  I have low suspicion for CHF exacerbation as there is no peripheral edema no rales present my exam x-ray does show possible small pleural effusion but there is no new oxygen requirements doubt this is the cause of his shortness of breath. low suspicion for strep throat as oropharynx was visualized, no erythema or exudates noted.  Low suspicion patient would need  hospitalized due to viral infection or Covid as vital signs reassuring, patient is not in respiratory distress.      Dispostion and problem list  After consideration of the diagnostic results and the patients response to treatment, I feel that the patent would benefit from antibiotics, inhaler, continue steroids follow-up with PCP.  Shortness of breath-likely acute on chronic COPD exacerbation, patient had some difficulty yesterday obtain his medication, will give him his first dose of medications, will have him continue with his home dose of steroids, also given him an inhaler, will have him follow-up with his PCP for further evaluation.  Given strict return precautions.            Final Clinical Impression(s) / ED Diagnoses Final diagnoses:  COPD exacerbation [facility])    Rx / DC Orders ED Discharge Orders     None         [name], PA-C 11/27/21 1600    Godfrey Pick, MD 11/30/21 1759

## 2021-11-27 NOTE — ED Notes (Signed)
CSW spoke with pt about his inability to pay for his medications. Pt states that he has spoken with someone and they are going to loan him the money so he can get them. CSW explained to pt that due to his insurance status pt would not qualify for medication assistance programs. TOC signing off.

## 2021-11-27 NOTE — Discharge Instructions (Signed)
Lab work imaging all reassuring, please continue to use your inhaler 1 to 2 puffs every 4-6 as needed for shortness of breath.  I have given your first dose of antibiotics (doxycycline) please pick up your antibiotics tomorrow and take as prescribed.  I also recommend a humidifier or boil a pot of water this can help moisturize the air.  Continue to abstain from smoking.  Please follow-up with your PCP for further evaluation.  Come back to the emergency department if you develop chest pain, shortness of breath, severe abdominal pain, uncontrolled nausea, vomiting, diarrhea.

## 2021-11-27 NOTE — ED Notes (Addendum)
Pt states unable to fill prescriptions from yesterday due to financial issues  Requesting neb treatment and something for his cough

## 2021-11-27 NOTE — ED Triage Notes (Signed)
Here yesterday with same, sob/chest tightness/left arm tightness and cough. Back today because still having same symptoms. Pt did not fill prescriptions. Pt in nad. Mild labored breathing. 02 Corinne chronic at 3L. Pt states having bad coughing spells and waiting on nebulizer by mail. Non diaphoretic.

## 2021-12-05 ENCOUNTER — Ambulatory Visit (HOSPITAL_COMMUNITY): Payer: Medicaid Other | Admitting: Physical Therapy

## 2021-12-06 ENCOUNTER — Other Ambulatory Visit: Payer: Self-pay | Admitting: Internal Medicine

## 2021-12-06 NOTE — Telephone Encounter (Signed)
Please advise patient requesting refill °

## 2021-12-07 ENCOUNTER — Other Ambulatory Visit: Payer: Self-pay | Admitting: Internal Medicine

## 2021-12-09 ENCOUNTER — Emergency Department (HOSPITAL_COMMUNITY): Payer: Medicaid Other

## 2021-12-09 ENCOUNTER — Emergency Department (HOSPITAL_COMMUNITY)
Admission: EM | Admit: 2021-12-09 | Discharge: 2021-12-09 | Disposition: A | Discharge status: Home / Self Care | Payer: Medicaid Other | Attending: Emergency Medicine | Admitting: Emergency Medicine

## 2021-12-09 ENCOUNTER — Other Ambulatory Visit: Payer: Self-pay

## 2021-12-09 ENCOUNTER — Encounter (HOSPITAL_COMMUNITY): Payer: Self-pay | Admitting: *Deleted

## 2021-12-09 DIAGNOSIS — Z79899 Other long term (current) drug therapy: Secondary | ICD-10-CM | POA: Diagnosis not present

## 2021-12-09 DIAGNOSIS — I10 Essential (primary) hypertension: Secondary | ICD-10-CM | POA: Diagnosis not present

## 2021-12-09 DIAGNOSIS — R0602 Shortness of breath: Secondary | ICD-10-CM | POA: Diagnosis present

## 2021-12-09 DIAGNOSIS — J441 Chronic obstructive pulmonary disease with (acute) exacerbation: Secondary | ICD-10-CM | POA: Diagnosis not present

## 2021-12-09 DIAGNOSIS — F17200 Nicotine dependence, unspecified, uncomplicated: Secondary | ICD-10-CM | POA: Diagnosis not present

## 2021-12-09 DIAGNOSIS — Z7951 Long term (current) use of inhaled steroids: Secondary | ICD-10-CM | POA: Diagnosis not present

## 2021-12-09 IMAGING — DX DG CHEST 1V PORT
2 series · 2 of 2 positions shown · non-contrast
Comparison: Chest x-ray [DATE].

CLINICAL DATA: 60-year-old male with history of shortness of
breath.

EXAM:
PORTABLE CHEST 1 VIEW

[chest ap (1 of 2)]
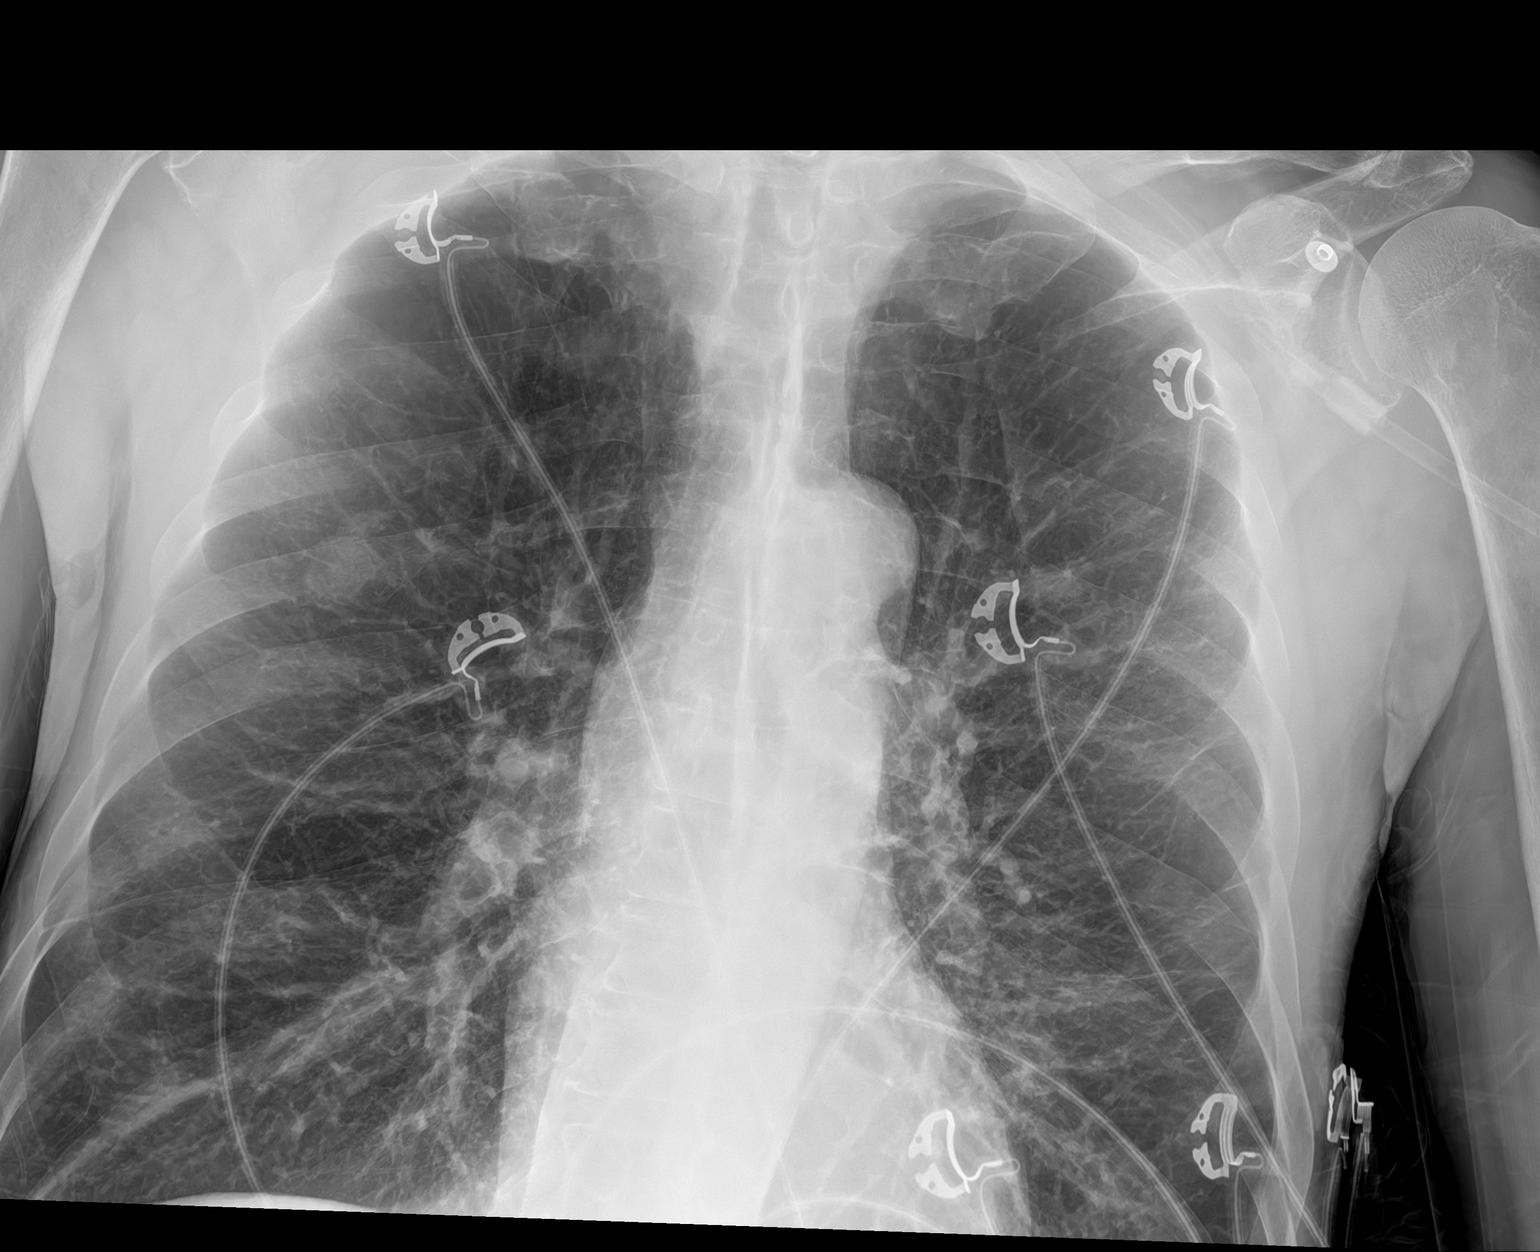

[chest ap (2 of 2)]
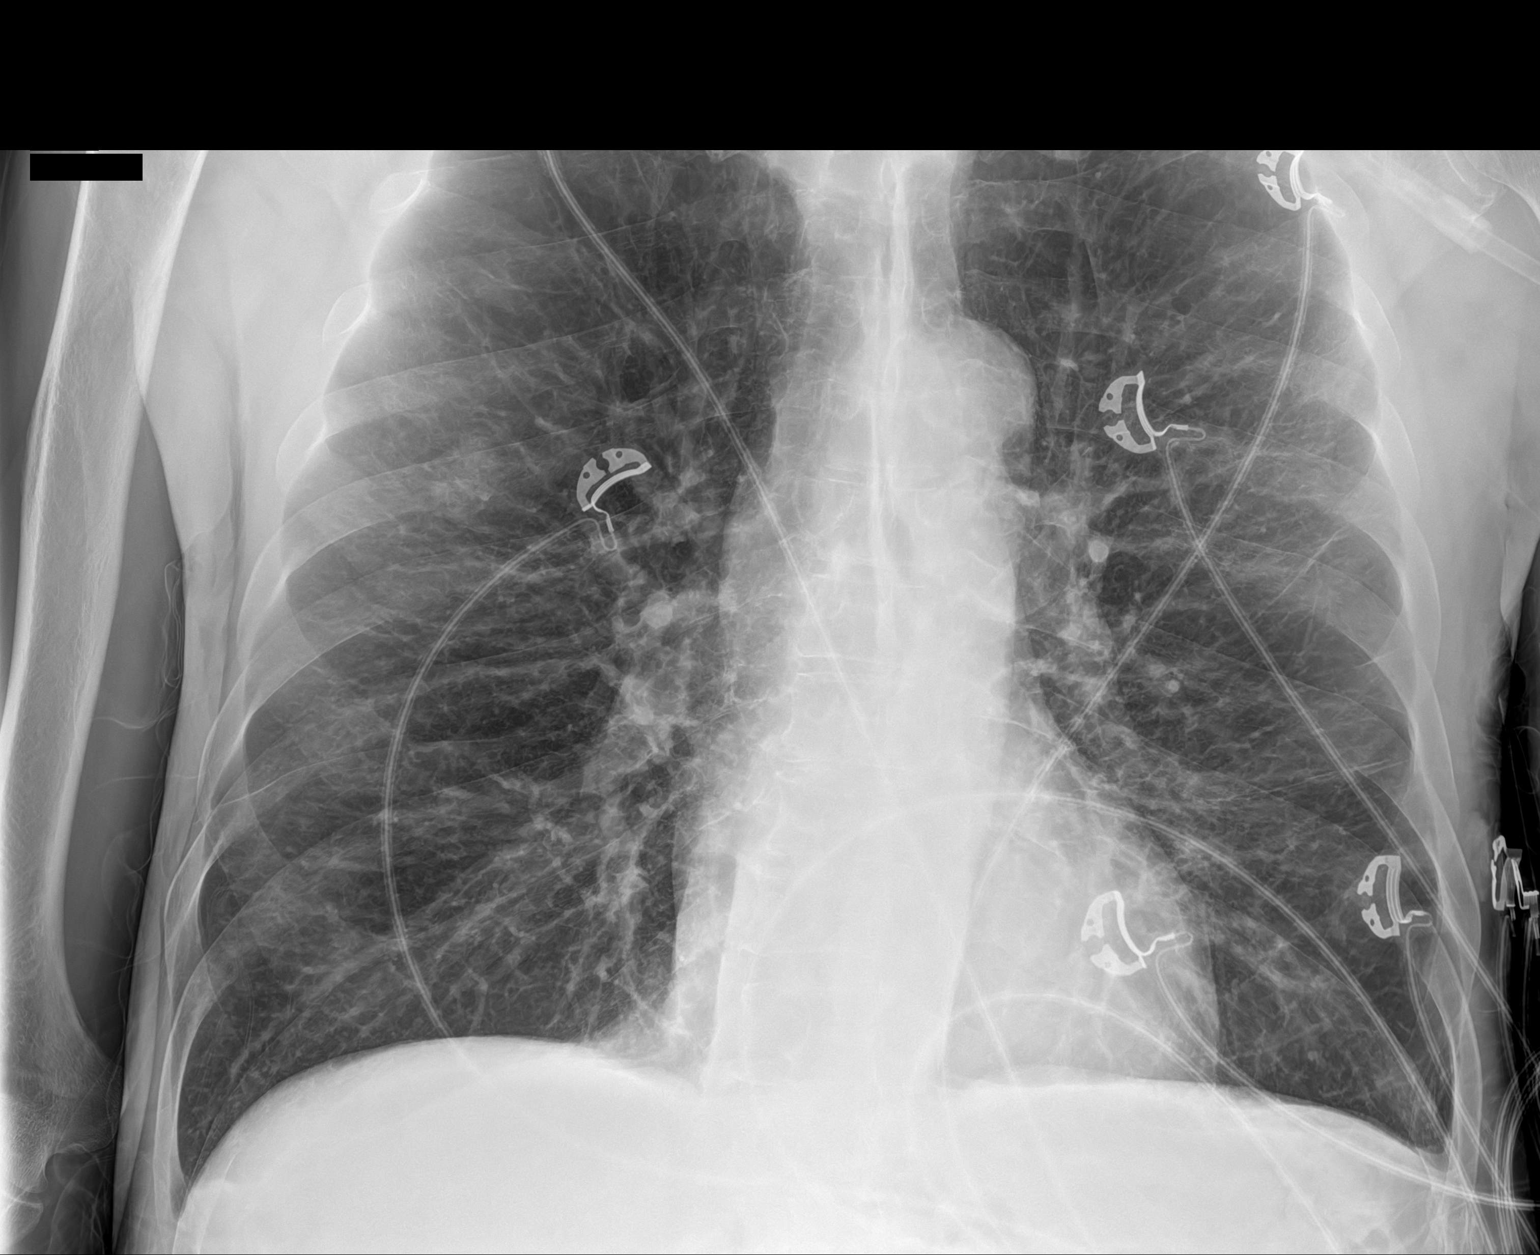

[2 of 2 positions shown; findings below may reference images not displayed]

FINDINGS: Lung volumes are increased with emphysematous changes. Diffuse
interstitial prominence and peribronchial cuffing, similar to prior
examinations. No consolidative airspace disease. No pleural
effusions. No pneumothorax. No pulmonary nodule or mass noted.
Pulmonary vasculature and the cardiomediastinal silhouette are
within normal limits. Atherosclerosis in the thoracic aorta. Healing
fracture of the posterolateral aspect of the right sixth rib again
noted.
IMPRESSION: 1. Diffuse peribronchial cuffing and interstitial prominence with
emphysematous changes, similar to prior examinations; imaging
findings suggestive of COPD. No definite acute findings are
otherwise noted.
2. Aortic atherosclerosis.
3. Healing fracture of the posterolateral aspect of the right sixth
rib.

## 2021-12-09 MED ORDER — PREDNISONE 10 MG PO TABS: 20.0000 mg | ORAL_TABLET | Freq: Two times a day (BID) | ORAL | 0 refills | Status: DC

## 2021-12-09 MED ORDER — ACETAMINOPHEN 500 MG PO TABS
1000.0000 mg | ORAL_TABLET | Freq: Once | ORAL | Status: AC
  Administered 2021-12-09: 1000 mg via ORAL
  Filled 2021-12-09: qty 2

## 2021-12-09 MED ORDER — ALBUTEROL SULFATE HFA 108 (90 BASE) MCG/ACT IN AERS
2.0000 | INHALATION_SPRAY | Freq: Four times a day (QID) | RESPIRATORY_TRACT | Status: DC
  Administered 2021-12-09: 2 via RESPIRATORY_TRACT
  Filled 2021-12-09: qty 6.7

## 2021-12-09 NOTE — Discharge Instructions (Addendum)
Resume your prednisone 20 mg twice a day.  Make an appointment follow-up with your doctor.  Use your albuterol inhaler 2 puffs every 6 hours.  Continue to use your oxygen.

## 2021-12-09 NOTE — ED Triage Notes (Signed)
Pt brought in by [facility] from home with c/o SOB. O2 sat 78% on RA and decreased lung sounds with wheezing upon EMS arrival. Albuterol 5mg  neb given, Solumedrol 125mg  IV given by EMS. O2 sat 100% on NRB and HR 100-110 upon arrival to ED.

## 2021-12-09 NOTE — ED Provider Notes (Addendum)
[facility] EMERGENCY DEPARTMENT Provider Note   CSN: [id number] Arrival date & time: 12/09/21  0355     History  Chief Complaint  Patient presents with   Shortness of Breath    Tony Sullivan is a 61 y.o. male.  Patient brought in by EMS.  For shortness of breath.  Oxygen saturation at home was 78% on room air decreased lung sounds with some wheezing upon EMS arrival.  They gave him albuterol nebulizer Solu-Medrol 125 mg.  Placed on 100% nonrebreather and oxygen sats came up to 100%.  Patient is normally on 3 to 4 L of oxygen every day  Patient tells me that he has run out of his albuterol inhaler and he also ran out of his prednisone.  He supposed be on 20 mg twice a day.  Past medical history is significant for hypertension COPD tobacco dependence prediabetes.  Patient still an active smoker.      Home Medications Prior to Admission medications   Medication Sig Start Date End Date Taking? Authorizing Provider  acetaminophen (TYLENOL) 325 MG tablet Take 2 tablets (650 mg total) by mouth every 6 (six) hours as needed for mild pain (or Fever >/= 101). 10/30/21   [name], MD  albuterol (PROVENTIL) (2.5 MG/3ML) 0.083% nebulizer solution Take 3 mLs (2.5 mg total) by nebulization every 4 (four) hours as needed for wheezing or shortness of breath. 10/30/21 10/30/22  [name], MD  albuterol (VENTOLIN HFA) 108 (90 Base) MCG/ACT inhaler Inhale 2 puffs into the lungs every 6 (six) hours as needed for wheezing or shortness of breath. 11/26/21   Sherrell Puller, PA-C  amLODipine (NORVASC) 10 MG tablet Take 1 tablet (10 mg total) by mouth daily. For BP 10/31/21   Emokpae, Courage, MD  atorvastatin (LIPITOR) 40 MG tablet TAKE ONE TABLET BY MOUTH DAILY Patient taking differently: Take 40 mg by mouth daily. 07/06/20   [name], MD  azithromycin (ZITHROMAX) 250 MG tablet Take as directed 11/16/21   [name], MD  benztropine (COGENTIN) 0.5 MG tablet Take 0.5 mg by mouth at  bedtime.  Patient not taking: Reported on 10/28/2021    [provider]  Budeson-Glycopyrrol-Formoterol (BREZTRI AEROSPHERE) 160-9-4.8 MCG/ACT AERO Inhale 2 puffs into the lungs in the morning and at bedtime. 10/30/21   [name], MD  budesonide-formoterol (SYMBICORT) 160-4.5 MCG/ACT inhaler Inhale 2 puffs into the lungs 2 (two) times daily. 10/30/21   [name], MD  cetirizine (ZYRTEC) 10 MG tablet Take 10 mg by mouth daily.    [provider]  dextromethorphan-guaiFENesin (MUCINEX DM) 30-600 MG 12hr tablet Take 1 tablet by mouth 2 (two) times daily. 10/30/21   [name], MD  diclofenac Sodium (VOLTAREN) 1 % GEL Apply 2 g topically 4 (four) times daily.    [provider]  dorzolamide (TRUSOPT) 2 % ophthalmic solution Place 1 drop into the left eye 2 (two) times daily. 12/12/20   [provider]  doxycycline (VIBRAMYCIN) 100 MG capsule Take 1 capsule (100 mg total) by mouth 2 (two) times daily. 11/26/21   Sherrell Puller, PA-C  hydrochlorothiazide (HYDRODIURIL) 25 MG tablet Take 0.5 tablets (12.5 mg total) by mouth daily. 10/30/21   [name], MD  hydrOXYzine (ATARAX) 25 MG tablet Take 1 tablet (25 mg total) by mouth 3 (three) times daily as needed for anxiety or nausea. 10/30/21   Emokpae, Courage, MD  latanoprost (XALATAN) 0.005 % ophthalmic solution Place 1 drop into both eyes at bedtime. 05/24/19  [provider]  loxapine (LOXITANE) 10 MG capsule Take 10 mg by mouth at bedtime.     [provider]  megestrol (MEGACE) 40 MG tablet Take 40 mg by mouth daily.    [provider]  mirtazapine (REMERON) 30 MG tablet Take 1 tablet (30 mg total) by mouth at bedtime. 10/30/21   [name], MD  Multiple Vitamin (MULTIVITAMIN WITH MINERALS) TABS tablet Take 1 tablet by mouth daily. 10/31/21   [name], MD  OXYGEN Inhale 3-4 L into the lungs as directed. 3-4 liters with sleep and exertion as needed for low oxygen     [provider]  pantoprazole (PROTONIX) 40 MG tablet Take 1 tablet (40 mg total) by mouth daily. 10/30/21   [name], MD  polyethylene glycol (MIRALAX / GLYCOLAX) 17 g packet TAKE 17 GRAMS BY MOUTH DAILY Patient taking differently: Take 17 g by mouth daily as needed for mild constipation. 03/09/20   [name], MD  predniSONE (DELTASONE) 10 MG tablet TAKE ONE TO TWO TABLETS BY MOUTH DAILY AS DIRECTED 12/06/21   [name], MD  risperiDONE (RISPERDAL) 2 MG tablet Take 1 tablet (2 mg total) by mouth at bedtime. 10/30/21   [name], MD  senna-docusate (SENOKOT-S) 8.6-50 MG tablet Take 2 tablets by mouth at bedtime. 10/30/21   [name], MD  simethicone (MYLICON) 40 TF/5.7DU drops Take 0.6 mLs (40 mg total) by mouth 4 (four) times daily as needed for flatulence. 10/17/21   Orpah Greek, MD      Allergies    Penicillins and Levocetirizine    Review of Systems   Review of Systems  Constitutional:  Negative for chills and fever.  HENT:  Negative for ear pain and sore throat.   Eyes:  Negative for pain and visual disturbance.  Respiratory:  Positive for shortness of breath and wheezing. Negative for cough.   Cardiovascular:  Negative for chest pain and palpitations.  Gastrointestinal:  Negative for abdominal pain and vomiting.  Genitourinary:  Negative for dysuria and hematuria.  Musculoskeletal:  Negative for arthralgias and back pain.  Skin:  Negative for color change and rash.  Neurological:  Negative for seizures and syncope.  All other systems reviewed and are negative.  Physical Exam Updated Vital Signs BP 128/80    Pulse 83    Temp 97.8 F (36.6 C) (Oral)    Resp 15    Ht 1.803 m (5\' 11" )    Wt 70.3 kg    SpO2 92%    BMI 21.62 kg/m  Physical Exam Vitals and nursing note reviewed.  Constitutional:      General: He is not in acute distress.    Appearance: Normal appearance. He is well-developed.  HENT:     Head: Normocephalic and  atraumatic.  Eyes:     Extraocular Movements: Extraocular movements intact.     Conjunctiva/sclera: Conjunctivae normal.     Pupils: Pupils are equal, round, and reactive to light.  Cardiovascular:     Rate and Rhythm: Normal rate and regular rhythm.     Heart sounds: No murmur heard. Pulmonary:     Effort: Pulmonary effort is normal. No respiratory distress.     Breath sounds: Wheezing present. No rhonchi or rales.  Abdominal:     Palpations: Abdomen is soft.     Tenderness: There is no abdominal tenderness.  Musculoskeletal:        General: No swelling.     Cervical back: Normal range of  motion and neck supple.  Skin:    General: Skin is warm and dry.     Capillary Refill: Capillary refill takes less than 2 seconds.  Neurological:     General: No focal deficit present.     Mental Status: He is alert and oriented to person, place, and time.  Psychiatric:        Mood and Affect: Mood normal.    ED Results / Procedures / Treatments   Labs (all labs ordered are listed, but only abnormal results are displayed) Labs Reviewed - No data to display  EKG EKG Interpretation  Date/Time:  Sunday December 09 2021 07:14:21 EST Ventricular Rate:  90 PR Interval:  155 QRS Duration: 83 QT Interval:  386 QTC Calculation: 467 R Axis:   -83 Text Interpretation: Sinus rhythm Atrial premature complex Biatrial enlargement Left anterior fascicular block No significant change since last tracing Confirmed by Fredia Sorrow [phone]) on 12/09/2021 7:18:31 AM  Radiology DG Chest Port 1 View  Result Date: 12/09/2021 CLINICAL DATA:  61 year old male with history of shortness of breath. EXAM: PORTABLE CHEST 1 VIEW COMPARISON:  Chest x-ray 11/27/2021. FINDINGS: Lung volumes are increased with emphysematous changes. Diffuse interstitial prominence and peribronchial cuffing, similar to prior examinations. No consolidative airspace disease. No pleural effusions. No pneumothorax. No pulmonary nodule or  mass noted. Pulmonary vasculature and the cardiomediastinal silhouette are within normal limits. Atherosclerosis in the thoracic aorta. Healing fracture of the posterolateral aspect of the right sixth rib again noted. IMPRESSION: 1. Diffuse peribronchial cuffing and interstitial prominence with emphysematous changes, similar to prior examinations; imaging findings suggestive of COPD. No definite acute findings are otherwise noted. 2. Aortic atherosclerosis. 3. Healing fracture of the posterolateral aspect of the right sixth rib. Electronically Signed   By: Vinnie Langton M.D.   On: 12/09/2021 08:04    Procedures Procedures    Medications Ordered in ED Medications  acetaminophen (TYLENOL) tablet 1,000 mg (1,000 mg Oral Given 12/09/21 0818)    ED Course/ Medical Decision Making/ A&P                           Medical Decision Making Amount and/or Complexity of Data Reviewed Radiology: ordered.  Risk OTC drugs. Prescription drug management.   Patient feeling much better actually with the treatments that were given to him by EMS on the way in.  Most likely ran in trouble because he had run out of his prednisone and his albuterol inhaler.  We will give an albuterol inhaler here to go.  We will renew his prednisone prescription.  Patient's lungs are clear currently no wheezing.  Oxygen saturations on 4 L are 96%.  Patient normally on 3 to 4 L.  Chest x-ray consistent with COPD.  No evidence of pneumonia.  Patient nontoxic no acute distress COPD exacerbation seems to be under control.   Final Clinical Impression(s) / ED Diagnoses Final diagnoses:  COPD exacerbation [facility])    Rx / DC Orders ED Discharge Orders     None         Fredia Sorrow, MD 12/09/21 8891    Fredia Sorrow, MD 12/09/21 [phone]

## 2021-12-12 ENCOUNTER — Ambulatory Visit (INDEPENDENT_AMBULATORY_CARE_PROVIDER_SITE_OTHER): Payer: Medicaid Other | Admitting: Internal Medicine

## 2021-12-12 ENCOUNTER — Other Ambulatory Visit: Payer: Self-pay

## 2021-12-12 ENCOUNTER — Encounter: Payer: Self-pay | Admitting: Internal Medicine

## 2021-12-12 VITALS — BP 132/74 | HR 96 | Temp 98.4°F | Ht 70.0 in | Wt 152.0 lb

## 2021-12-12 DIAGNOSIS — F17210 Nicotine dependence, cigarettes, uncomplicated: Secondary | ICD-10-CM

## 2021-12-12 DIAGNOSIS — J9611 Chronic respiratory failure with hypoxia: Secondary | ICD-10-CM

## 2021-12-12 DIAGNOSIS — J449 Chronic obstructive pulmonary disease, unspecified: Secondary | ICD-10-CM | POA: Diagnosis not present

## 2021-12-12 MED ORDER — ALBUTEROL SULFATE HFA 108 (90 BASE) MCG/ACT IN AERS: 2.0000 | INHALATION_SPRAY | Freq: Four times a day (QID) | RESPIRATORY_TRACT | 2 refills | Status: DC | PRN

## 2021-12-12 MED ORDER — BREZTRI AEROSPHERE 160-9-4.8 MCG/ACT IN AERO: 2.0000 | INHALATION_SPRAY | Freq: Two times a day (BID) | RESPIRATORY_TRACT | 3 refills | Status: DC

## 2021-12-12 MED ORDER — ALBUTEROL SULFATE (2.5 MG/3ML) 0.083% IN NEBU: 2.5000 mg | INHALATION_SOLUTION | RESPIRATORY_TRACT | 2 refills | Status: DC | PRN

## 2021-12-12 NOTE — Assessment & Plan Note (Addendum)
First started on 27 December 2013 by [facility] clinic - 02/14/2014  Walked RA x 3/4  laps @ 185 ft each stopped due to  desat 85% and corrected on 3lpm to x 2 laps  - 06/15/2014  Walked 2.5lpm  2 laps  @ 185 ft each stopped due to  Sob adequate sats  - 02/20/2015  Walked RA x 3 laps @ 185 ft each stopped due to  Sob/desat p 2 laps, nl pace, eliminated on his 02 at 3lpm  - 12/05/2015   Walked RA  2 laps @ 185 ft each stopped due to  desat to 88% and corrected on 2lpm  - 03/16/2018   Walked 3lmpm  x one lap @ 185 stopped due to /desat  To 89% at relatively fast pace  - 06/10/2018   Walked RA x one lap @ 185 stopped due to  Sob / desats to 86% resolved on repeating walk on 2lpm but still sob p one lap  - 07/27/2018  Walked 2lpm continuous x 2 laps @ 185 ft each stopped due to  End of study, nl pace, no desats but stopped due to sob - 09/18/2018   Walked 0.5 lpm  x one lap @ 1=210 ft stopped due to  Sob s desats 05/13/2019 Patient Saturations on Room Air at Rest = 97% then while Ambulating = 88% Patient Saturations on 4 Liters of pulsed oxygen while Ambulating = 94% 05/03/2020 Re-qualifed for oxygen. O2 88% on RA after 1 lap, patients saturations on 4L while ambulating 95%   rec as of  12/12/2021   3 lpm hs and adjust to keep > 90% walking  Advised on using humidified 02 to prevent nasal drying           Each maintenance medication was reviewed in detail including emphasizing most importantly the difference between maintenance and prns and under what circumstances the prns are to be triggered using an action plan format where appropriate.  Total time for H and P, chart review, counseling, reviewing hfa/neb/02  device(s) and generating customized AVS unique to this office visit / same day charting = 25 min

## 2021-12-12 NOTE — Progress Notes (Signed)
Subjective:   Patient ID: Tony Sullivan, male    DOB: 07/13/1961  MRN: [id number]    Brief patient profile:  17  yobm active smoker with breathing difficulties since 2004 dx as copd on advair 250 /50 but getting worse since 2014 and placed on 02 for the first time in March 2015 and referred 01/07/2014 to pulmonary clinic by Dr Kennon Holter with GOLD II copd criteria established 02/14/14    History of Present Illness  01/07/2014 1st [facility] office visit/ Toshua Honsinger  Chief Complaint  Patient presents with   Advice Only    Referred for COPD, SOB.  Pt c/o SOB, low 02, prod cough with white mucous.  Pt forgot med list, unsure of what all he is taking  still can do a [employer] but uses [employer] parking, assoc with congested coughing esp in am worse in winter. rec Stop advair   Plan A =  Automatic =  symbicort 160 Take 2 puffs first thing in am and then another 2 puffs about 12 hours later. spiriva in am only  Plan B = Backup- Proventil use it only if you can't your breath - ok to use up to every 4 hours if needed Plan C = Nebulizer up to every 4 hours if needed only if you try plan B first and it doesn't wor   07/27/2018  f/u ov/Jacelynn Hayton re: copd/ did not bring med calendar or all meds/ still smoking / prednisone 20 mg daily  Chief Complaint  Patient presents with   Follow-up    States his breathing is about the same but when it is hot it gets much worse.   Dyspnea:  Across room even on 02 sob unless uses nebs / MMRC4  = sob if tries to leave home or while getting dressed   Cough: clear / rattling Sleeping: < 30 degrees from multiple pillows SABA use: way over using 02: 02 2lpm   rec For cough / congestion > mucinex up to 1200 mg every 12 hours and use the flutter valve as much as you can Try off tudorza and start back on spiriva 2.5  X 2 pffs each am  Call if unable to get back into [facility] rehab  Please schedule a follow up office visit in 6 weeks, call sooner if needed with all  medications /inhalers/ solutions in hand    09/18/2018  f/u ov/Frona Yost re:  GOLD III copd / prednisone 20 mg / did not bring med cal as req maint on symb 160/spiriva smi Chief Complaint  Patient presents with   Follow-up    Breathing is doing well. He is using his albuterol inhaler 4-5 per day because he states he has been walking more. He uses his neb 3 to 4 x per wk.    Dyspnea:  Can't do [employer] s stopping on 1lpm but doesn't check sats or titrate up  Cough: esp in am x  first hour but mostly white and < a tbsp or two produced  Sleeping: ok flat  SABA use: as above 02: sleeping on 1.5- 2lpm and never above 1lpm walking  rec Try prednisone 10 mg daily to see if you do just as well and if flares then double the dose until better Only use your albuterol as a rescue medication  Please use flutter valve in am to help with the morning congestion     12/21/2018  f/u ov/Antoino Westhoff re:  GOLD III copd/ pred 30 mg daily with  floor 10 mg on symb 160/spiriva  Chief Complaint  Patient presents with   Follow-up    Pt c/o sinus pressure off and on for the past wk. He is having some increased SOB this morning.    Dyspnea:  Can do shopping at grocery on 2lpm does not check sats  Cough: usual cough = rattling  Sleeping: bed is flat / pillows up to 30 degrees  SABA use: up to q 6 hours on neb  02: 2lpm  rec Plan A = Automatic = symbicort 160 Take 2 puffs first thing in am and then another 2 puffs about 12 hours later/spiriva 2.5  X 3 puffs each am  Work on inhaler technique Plan B = Backup Only use your albuterol inhaler as a rescue medication  Plan C = Crisis - only use your albuterol nebulizer if you first try Plan B and it fails to help > ok to use the nebulizer up to every 4 hours but if start needing it regularly call for immediate appointment Plan D = Deltasone  Take 2 daily until better then 1 daily    05/13/2019  f/u ov/Jonty Morrical re:  COPD III copd and pred 20 mg bid  Chief Complaint  Patient  presents with   Follow-up    Breathing has been progressively worse. He is still smoking. He is using his proair multiple x per day and neb daily.   Dyspnea:  Walking 2 blocks slow pace on 3-4 doesn't titrate  Cough: minimal esp am still clear  Sleeping: ok propped up 30 degrees  SABA use: way over using as above  02: no using it sitting , up to 4lpm walking, 2.5 hs  rec 02 is 2.5 lpm at bedtime and up to 4lpm POC walking   09/20/2019 televist rec Plan A = Automatic = Always=    symbicort and spiriva Plan B = Backup (to supplement plan A, not to replace it) Only use your albuterol inhaler as a rescue medication  Plan C = Crisis (instead of Plan B but only if Plan B stops working) - only use your albuterol nebulizer if you first try Plan B and it fails to help > ok to use the nebulizer up to every 4 hours but if start needing it regularly call for immediate appointment Plan D = Deltasone - if you need more albuterol especially the nebulizer to get thu the day then take prednisone 10 mg x 2 each am until better then return to 1 day    03/20/2020  f/u ov/Wenonah Milo re:  GOLD III/ prednisone @ 20 mg per day  Chief Complaint  Patient presents with   Follow-up    pt is having sob and using rescue inhakler 5 to 6 times a week  Dyspnea:  Room to room despite 02  Cough: some in ams Sleeping: 30 degrees  SABA uses way too much  02: hs on 2.5  And 4lpm during the day, not titrating  rec Plan A = Automatic = Always=    symbicort and spiriva daily  Plan B = Backup (to supplement plan A, not to replace it) Only use your albuterol inhaler as rescue   Plan C = Crisis (instead of Plan B but only if Plan B stops working) - only use your albuterol nebulizer if you first try Plan B Plan D = Deltasone - if you need more albuterol especially the nebulizer to get thu the day then take prednisone 10 mg x 2 each am  until better then taper to 10 mg daily  - ok to use the middle step of 20 mg alternating with  10 mg daily on even vs odd dates  Ok try albuterol 15 min (one day use the inhaler then next the nebulizer) before an activity that you know would normally  make you short of breath  For cough > mucinex or mucinex dm up to 1200 mg every 12 hours    11/06/2020  f/u ov/[place] office/Courtnee Myer re: GOLD III copd/ 02 dep still on pred at 20 mg daily  Chief Complaint  Patient presents with   Follow-up    Tightness of chest on and off for about 2 weeks   Dyspnea:  Room to room  Cough: min mucoid in am/ resumed smoking  Sleeping:still 30 degrees  SABA use: way too much, not prechallenging as rec  02: 2.5- 4lpm depending on how he feels,not tracking sats though has oximetry  Rec Continue pepcid 20 mg at bedtime and be sure to take protonix 40 mg Take 30-60 min before first meal of the day  Make sure you check your oxygen saturation  at your highest level of activity  For cough/ congestion/ tightness / discolored sputum > zpak  Please schedule a follow up visit in 3 months but call sooner if needed     12/12/2021  f/u ov/[place] office/Shellyann Wandrey re: GOLD 3 / 02 dep  maint on breztri/ Prednisone  10 mg x 2 each am (more tight chest when accidentally ran out)plan to wean to 5 mg  No chief complaint on file. Dyspnea:  planning to start back rehab but has resumed smoking  Cough: worse than usual but dry / assoc nasal congestion on 02 s humidity  Sleeping: sleeping in bed flat/ bunch of pillows  SABA use: using q 4 y hfa or neb and sometimes  02: 3-4lpm  Covid status: vax x 4      No obvious day to day or daytime variability or assoc excess/ purulent sputum or mucus plugs or hemoptysis or cp or chest tightness, subjective wheeze or overt sinus or hb symptoms.   Sleeping as above  without nocturnal  or early am exacerbation  of respiratory  c/o's or need for noct saba. Also denies any obvious fluctuation of symptoms with weather or environmental changes or other aggravating or alleviating factors  except as outlined above   No unusual exposure hx or h/o childhood pna/ asthma or knowledge of premature birth.  Current Allergies, Complete Past Medical History, Past Surgical History, Family History, and Social History were reviewed in [employer] record.  ROS  The following are not active complaints unless bolded Hoarseness, sore throat, dysphagia, dental problems, itching, sneezing,  nasal congestion or discharge of excess mucus or purulent secretions, ear ache,   fever, chills, sweats, unintended wt loss or wt gain, classically pleuritic or exertional cp,  orthopnea pnd or arm/hand swelling  or leg swelling, presyncope, palpitations, abdominal pain, anorexia, nausea, vomiting, diarrhea  or change in bowel habits or change in bladder habits, change in stools or change in urine, dysuria, hematuria,  rash, arthralgias, visual complaints, headache, numbness, weakness or ataxia or problems with walking or coordination,  change in mood or  memory.        Current Meds  Medication Sig   acetaminophen (TYLENOL) 325 MG tablet Take 2 tablets (650 mg total) by mouth every 6 (six) hours as needed for mild pain (or Fever >/= 101).   atorvastatin (  LIPITOR) 40 MG tablet TAKE ONE TABLET BY MOUTH DAILY (Patient taking differently: Take 40 mg by mouth daily.)   azithromycin (ZITHROMAX) 250 MG tablet Take as directed   benztropine (COGENTIN) 0.5 MG tablet Take 0.5 mg by mouth at bedtime.   cetirizine (ZYRTEC) 10 MG tablet Take 10 mg by mouth daily.   dextromethorphan-guaiFENesin (MUCINEX DM) 30-600 MG 12hr tablet Take 1 tablet by mouth 2 (two) times daily.   diclofenac Sodium (VOLTAREN) 1 % GEL Apply 2 g topically 4 (four) times daily.   dorzolamide (TRUSOPT) 2 % ophthalmic solution Place 1 drop into the left eye 2 (two) times daily.   hydrochlorothiazide (HYDRODIURIL) 25 MG tablet Take 0.5 tablets (12.5 mg total) by mouth daily.   hydrOXYzine (ATARAX) 25 MG tablet Take 1 tablet (25 mg  total) by mouth 3 (three) times daily as needed for anxiety or nausea.   latanoprost (XALATAN) 0.005 % ophthalmic solution Place 1 drop into both eyes at bedtime.   loxapine (LOXITANE) 10 MG capsule Take 10 mg by mouth at bedtime.    megestrol (MEGACE) 40 MG tablet Take 40 mg by mouth daily.   mirtazapine (REMERON) 30 MG tablet Take 1 tablet (30 mg total) by mouth at bedtime.   Multiple Vitamin (MULTIVITAMIN WITH MINERALS) TABS tablet Take 1 tablet by mouth daily.   OXYGEN Inhale 3-4 L into the lungs as directed. 3-4 liters with sleep and exertion as needed for low oxygen   pantoprazole (PROTONIX) 40 MG tablet Take 1 tablet (40 mg total) by mouth daily.   polyethylene glycol (MIRALAX / GLYCOLAX) 17 g packet TAKE 17 GRAMS BY MOUTH DAILY (Patient taking differently: Take 17 g by mouth daily as needed for mild constipation.)   predniSONE (DELTASONE) 10 MG tablet Take 2 tablets (20 mg total) by mouth 2 (two) times daily with a meal.   risperiDONE (RISPERDAL) 2 MG tablet Take 1 tablet (2 mg total) by mouth at bedtime.   senna-docusate (SENOKOT-S) 8.6-50 MG tablet Take 2 tablets by mouth at bedtime.   simethicone (MYLICON) 40 PP/2.9JJ drops Take 0.6 mLs (40 mg total) by mouth 4 (four) times daily as needed for flatulence.   [DISCONTINUED] albuterol (PROVENTIL) (2.5 MG/3ML) 0.083% nebulizer solution Take 3 mLs (2.5 mg total) by nebulization every 4 (four) hours as needed for wheezing or shortness of breath.   [DISCONTINUED] albuterol (VENTOLIN HFA) 108 (90 Base) MCG/ACT inhaler Inhale 2 puffs into the lungs every 6 (six) hours as needed for wheezing or shortness of breath.   [DISCONTINUED] Budeson-Glycopyrrol-Formoterol (BREZTRI AEROSPHERE) 160-9-4.8 MCG/ACT AERO Inhale 2 puffs into the lungs in the morning and at bedtime.                Objective:   Physical Exam    12/12/2021   152 11/06/2020   159 03/20/2020   162 05/13/2019  159  12/21/2018    163      06/15/2014  164  >  09/15/2014 158 >    02/20/2015 >156 03/22/2015 >  07/31/2015   150 > 12/05/2015  161   > 04/01/2016    151 > 07/29/2016  152 >   02/25/2017  153 >  06/05/2017  153 > 11/03/2017   166 > 01/30/2018  167> 03/16/2018  168 > 06/10/2018  167 > 07/27/2018  169 > 09/18/2018 163 >     Vital signs reviewed  12/12/2021  - Note at rest 02 sats  98% on RA   General appearance:    amb  bm in motorized w/c  HEENT : pt wearing mask not removed for exam due to covid -19 concerns.    NECK :  without JVD/Nodes/TM/ nl carotid upstrokes bilaterally   LUNGS: no acc muscle use,  Mod barrel  contour chest wall with bilateral  Distant bs s audible wheeze and  without cough on insp or exp maneuvers and mod  Hyperresonant  to  percussion bilaterally     CV:  RRR  no s3 or murmur or increase in P2, and no edema   ABD:  soft and nontender with pos mid insp Hoover's . No bruits or organomegaly appreciated, bowel sounds nl  MS:     ext warm without deformities, calf tenderness, cyanosis or clubbing No obvious joint restrictions   SKIN: warm and dry without lesions    NEURO:  alert, approp, nl sensorium with  no motor or cerebellar deficits apparent.               I personally reviewed images and agree with radiology impression as follows:  CXR:   portable 12/09/21  1. Diffuse peribronchial cuffing and interstitial prominence with emphysematous changes, similar to prior examinations; imaging findings suggestive of COPD. No definite acute findings are otherwise noted. 2. Aortic atherosclerosis. 3. Healing fracture of the posterolateral aspect of the right sixth rib.

## 2021-12-12 NOTE — Patient Instructions (Addendum)
We will ask [employer] to humidify your home 02   Only use your albuterol as a rescue medication to be used if you can't catch your breath by resting or doing a relaxed purse lip breathing pattern.  - The less you use it, the better it will work when you need it. - Ok to use up to 2 puffs  every 4 hours if you must but call for immediate appointment if use goes up over your usual need - Don't leave home without it !!  (think of it like the spare tire for your car)   Ok to try albuterol 15 min before an activity (on alternating days)  that you know would usually make you short of breath and see if it makes any difference and if makes none then don't take albuterol after activity unless you can't catch your breath as this means it's the resting that helps, not the albuterol.       Please schedule a follow up visit in 3 months but call sooner if needed

## 2021-12-12 NOTE — Assessment & Plan Note (Signed)
4-5 min discussion re active cigarette smoking in addition to office E&M  Ask about tobacco use:   Ongoing  Advise quitting   I took an extended  opportunity with this patient to outline the consequences of continued cigarette use  in airway disorders based on all the data we have from the multiple national lung health studies (perfomed over decades at millions of dollars in cost)  indicating that smoking cessation, not choice of inhalers or physicians, is the most important aspect of care.   Assess willingness:  Not committed at this point Assist in quit attempt:  Per PCP when ready Arrange follow up:   Follow up per Primary Care planned

## 2021-12-12 NOTE — Assessment & Plan Note (Signed)
Quit smoking 03/2020  - 02/14/2014   try symbicort 160 2bid  - PFT's  02/14/2014   FEV1 1.82 (53%) and ratio 47 no better p alb (p spiriva and symbicort 160)  and DLCO 32 with 37%  07/2014 >Spiriva stopped  09/15/2014 Med calendar> not using 02/20/2015   - Declined rehab 07/31/15  - Quit smoking 11/2016  - flutter valve 04/01/2016  -med calendar 08/20/16  - added prn pred to action plan 12/05/2016  - changed to maint pred 02/25/2017 with floor of 10 mg daily >>>  - 06/05/2017  After extensive coaching device effectiveness =    90% smi > change tudorza to spiriva 2.5 x 2 > unable to afford - 11/03/2017  After extensive coaching inhaler device  effectiveness =    90% with DPI/ tudorza  - add pepcid 20 mg at hs 01/30/2018 for noct/ am wheeze - Spirometry 03/16/2018  FEV1 1.16 (34%)  Ratio 57 p am symb/ tudorza - referred again to rehab 06/10/2018  And 07/27/2018  > declined 09/18/2018  - 07/27/2018  After extensive coaching inhaler device,  effectiveness =    90% with respimat  - 09/18/2018 flutter valve training and started daily pred with floor 10 mg  - 12/21/2018  After extensive coaching inhaler device,  effectiveness =    75% (short Ti)   - 12/12/2021  After extensive coaching inhaler device,  effectiveness =    80%   Group D in terms of symptom/risk and laba/lama/ICS  therefore appropriate rx at this point >>>  Continue breztri and lowest achievable pred dose with a floor of 5 mg daily   Re SABA :  I spent extra time with pt today reviewing appropriate use of albuterol for prn use on exertion with the following points: 1) saba is for relief of sob that does not improve by walking a slower pace or resting but rather if the pt does not improve after trying this first. 2) If the pt is convinced, as many are, that saba helps recover from activity faster then it's easy to tell if this is the case by re-challenging : ie stop, take the inhaler, then p 5 minutes try the exact same activity (intensity of workload) that  just caused the symptoms and see if they are substantially diminished or not after saba 3) if there is an activity that reproducibly causes the symptoms, try the saba 15 min before the activity on alternate days   If in fact the saba really does help, then fine to continue to use it prn but advised may need to look closer at the maintenance regimen being used to achieve better control of airways disease with exertion.

## 2021-12-15 ENCOUNTER — Emergency Department (HOSPITAL_COMMUNITY): Payer: Medicaid Other

## 2021-12-15 ENCOUNTER — Other Ambulatory Visit: Payer: Self-pay

## 2021-12-15 ENCOUNTER — Encounter (HOSPITAL_COMMUNITY): Payer: Self-pay

## 2021-12-15 ENCOUNTER — Emergency Department (HOSPITAL_COMMUNITY)
Admission: EM | Admit: 2021-12-15 | Discharge: 2021-12-15 | Disposition: A | Discharge status: Home / Self Care | Payer: Medicaid Other | Attending: Emergency Medicine | Admitting: Emergency Medicine

## 2021-12-15 DIAGNOSIS — Z20822 Contact with and (suspected) exposure to covid-19: Secondary | ICD-10-CM | POA: Insufficient documentation

## 2021-12-15 DIAGNOSIS — R1084 Generalized abdominal pain: Secondary | ICD-10-CM | POA: Diagnosis not present

## 2021-12-15 DIAGNOSIS — R1010 Upper abdominal pain, unspecified: Secondary | ICD-10-CM | POA: Diagnosis present

## 2021-12-15 DIAGNOSIS — J449 Chronic obstructive pulmonary disease, unspecified: Secondary | ICD-10-CM | POA: Insufficient documentation

## 2021-12-15 LAB — COMPREHENSIVE METABOLIC PANEL
ALT: 30 U/L (ref 0–44)
AST: 26 U/L (ref 15–41)
Albumin: 3.3 g/dL — ABNORMAL LOW (ref 3.5–5.0)
Alkaline Phosphatase: 60 U/L (ref 38–126)
Anion gap: 5 (ref 5–15)
BUN: 17 mg/dL (ref 6–20)
CO2: 33 mmol/L — ABNORMAL HIGH (ref 22–32)
Calcium: 8.5 mg/dL — ABNORMAL LOW (ref 8.9–10.3)
Chloride: 99 mmol/L (ref 98–111)
Creatinine, Ser: 0.91 mg/dL (ref 0.61–1.24)
GFR, Estimated: >60 mL/min (ref >60)
Glucose, Bld: 103 mg/dL — ABNORMAL HIGH (ref 70–99)
Potassium: 3.3 mmol/L — ABNORMAL LOW (ref 3.5–5.1)
Sodium: 137 mmol/L (ref 135–145)
Total Bilirubin: 0.4 mg/dL (ref 0.3–1.2)
Total Protein: 6.6 g/dL (ref 6.5–8.1)

## 2021-12-15 LAB — CBC WITH DIFFERENTIAL/PLATELET
Abs Immature Granulocytes: 0.16 10*3/uL — ABNORMAL HIGH (ref 0.00–0.07)
Basophils Absolute: 0 10*3/uL (ref 0.0–0.1)
Basophils Relative: 0 %
Eosinophils Absolute: 0 10*3/uL (ref 0.0–0.5)
Eosinophils Relative: 0 %
HCT: 45.5 % (ref 39.0–52.0)
Hemoglobin: 14.3 g/dL (ref 13.0–17.0)
Immature Granulocytes: 1 %
Lymphocytes Relative: 5 %
Lymphs Abs: 0.7 10*3/uL (ref 0.7–4.0)
MCH: 32.2 pg (ref 26.0–34.0)
MCHC: 31.4 g/dL (ref 30.0–36.0)
MCV: 102.5 fL — ABNORMAL HIGH (ref 80.0–100.0)
Monocytes Absolute: 0.6 10*3/uL (ref 0.1–1.0)
Monocytes Relative: 4 %
Neutro Abs: 12.6 10*3/uL — ABNORMAL HIGH (ref 1.7–7.7)
Neutrophils Relative %: 90 %
Platelets: 267 10*3/uL (ref 150–400)
RBC: 4.44 MIL/uL (ref 4.22–5.81)
RDW: 15.4 % (ref 11.5–15.5)
WBC: 14.1 10*3/uL — ABNORMAL HIGH (ref 4.0–10.5)
nRBC: 0 % (ref 0.0–0.2)

## 2021-12-15 LAB — RESP PANEL BY RT-PCR (FLU A&B, COVID) ARPGX2
Influenza A by PCR: NEGATIVE
Influenza B by PCR: NEGATIVE
SARS Coronavirus 2 by RT PCR: NEGATIVE

## 2021-12-15 LAB — LIPASE, BLOOD: Lipase: 48 U/L (ref 11–51)

## 2021-12-15 IMAGING — DX DG ABDOMEN 1V
2 series · 2 of 2 positions shown · non-contrast
Comparison: CT [DATE]

CLINICAL DATA: Abdominal pain, bowel obstruction

EXAM:
ABDOMEN - 1 VIEW

[abdomen supine grid (1 of 2)]
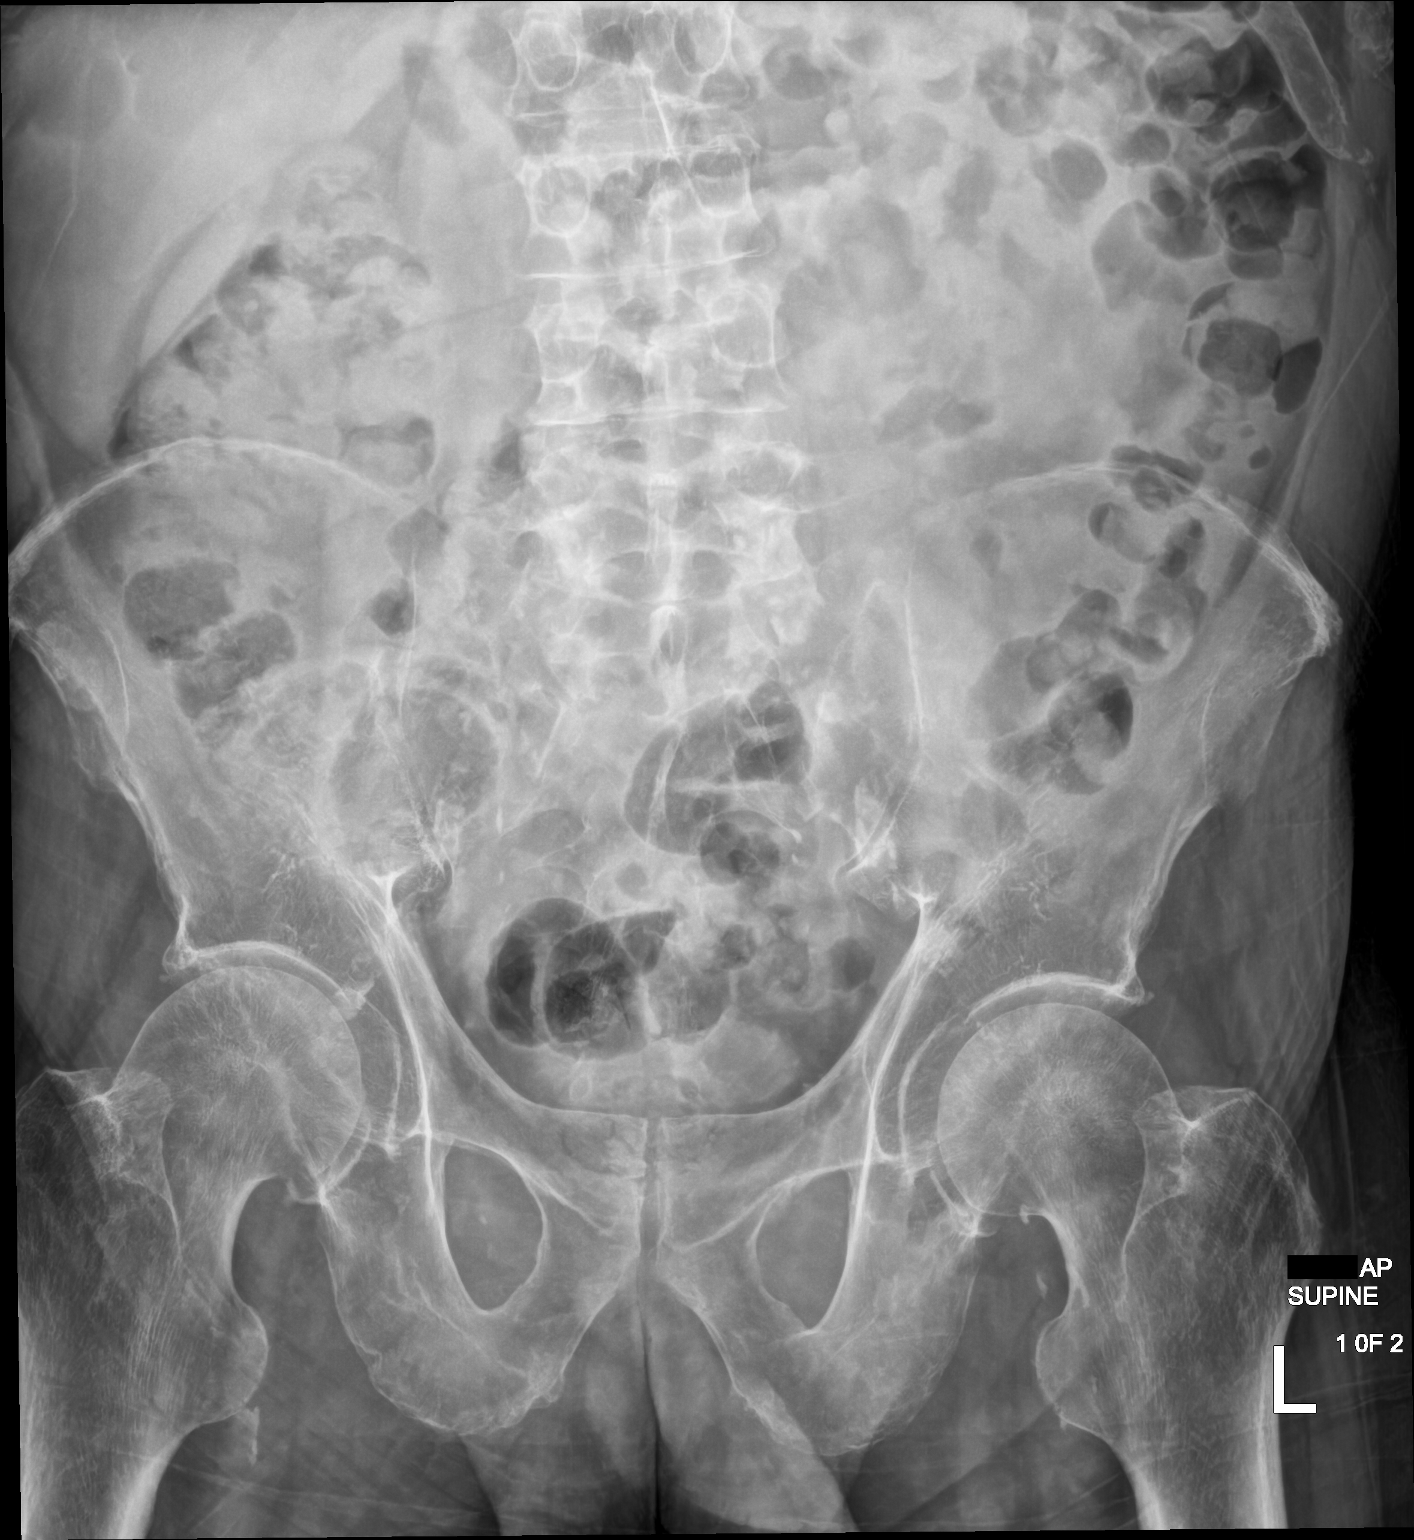

[abdomen supine grid (2 of 2)]
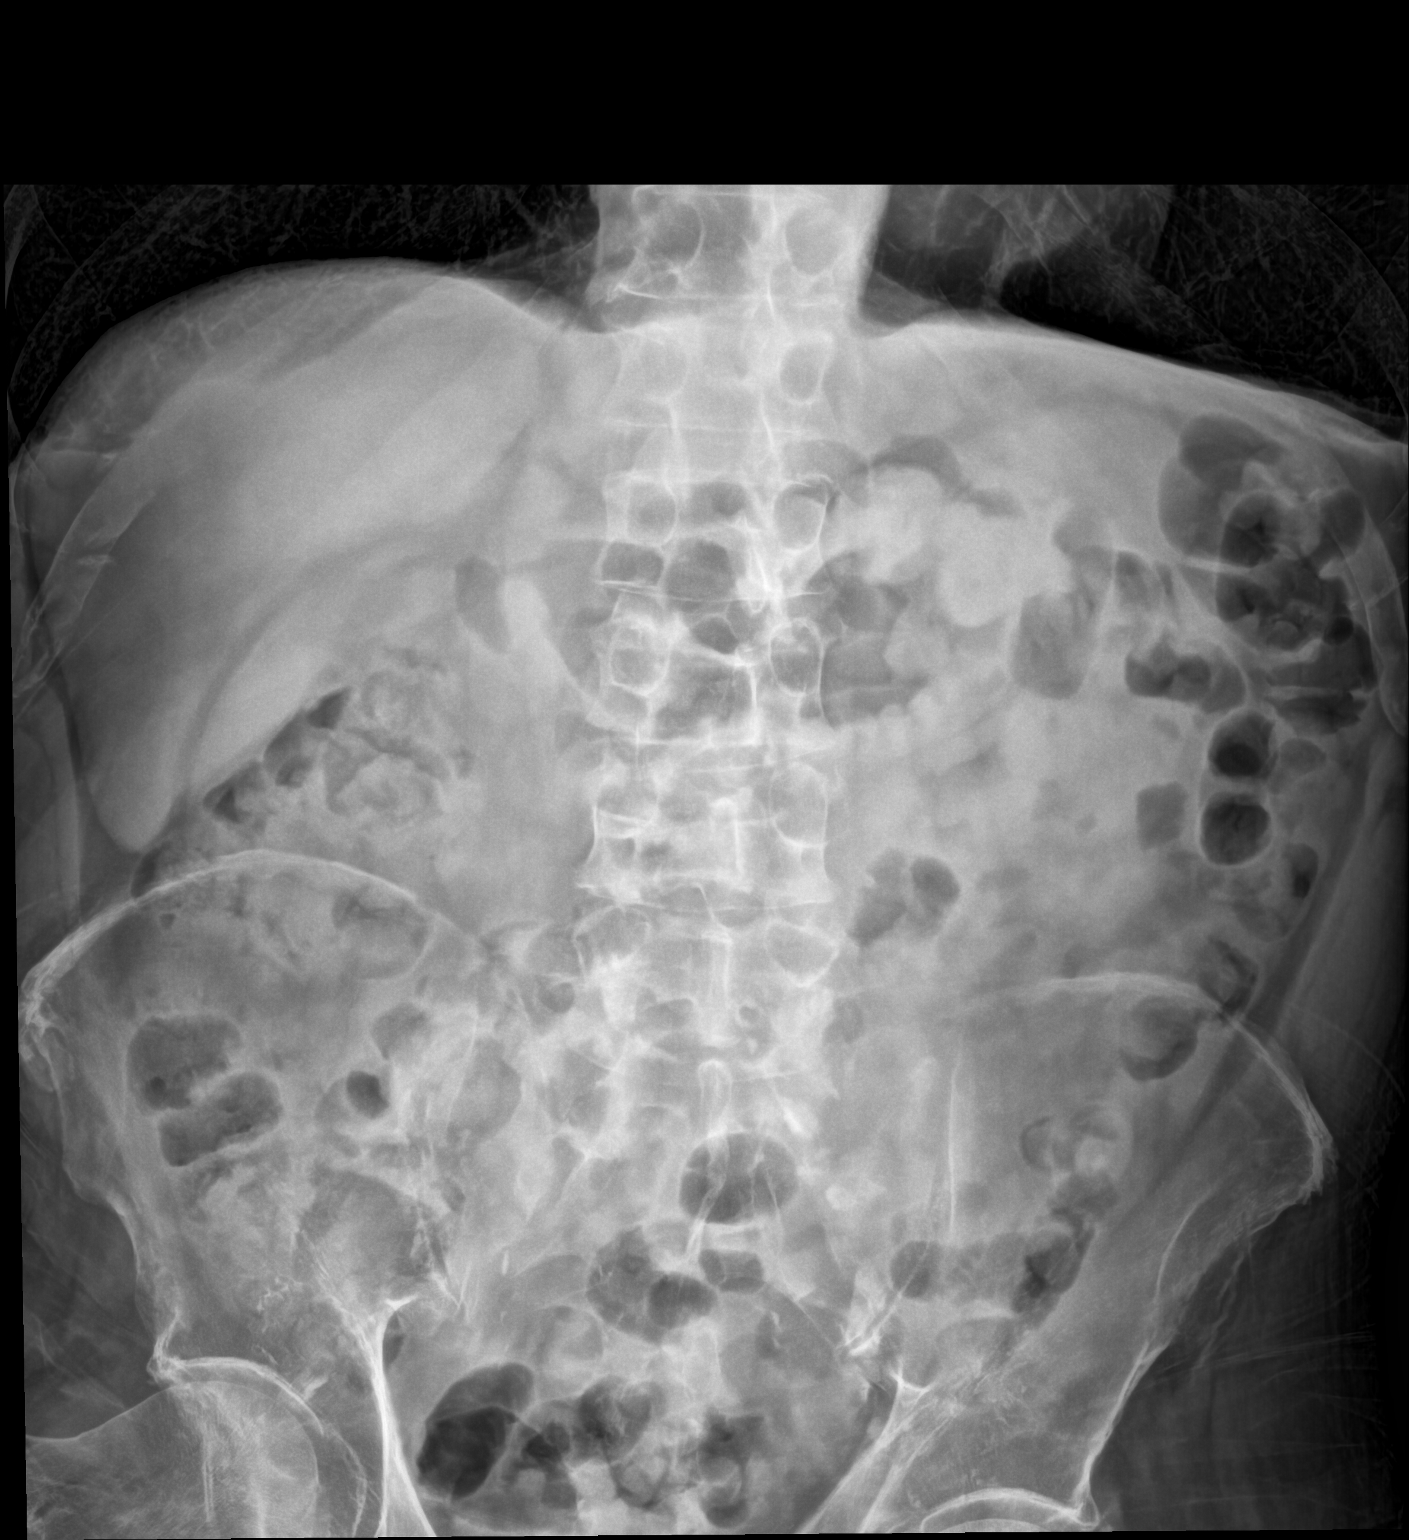

[2 of 2 positions shown; findings below may reference images not displayed]

FINDINGS: Nonobstructive bowel gas pattern. Moderate colonic stool burden. No
acute osseous abnormality.
IMPRESSION: No evidence of bowel obstruction.

## 2021-12-15 IMAGING — DX DG CHEST 1V PORT
3 series · 3 of 3 positions shown · non-contrast
Comparison: [DATE]

CLINICAL DATA: Cough, COPD, chest pain

EXAM:
PORTABLE CHEST 1 VIEW

[chest ap (1 of 3)]
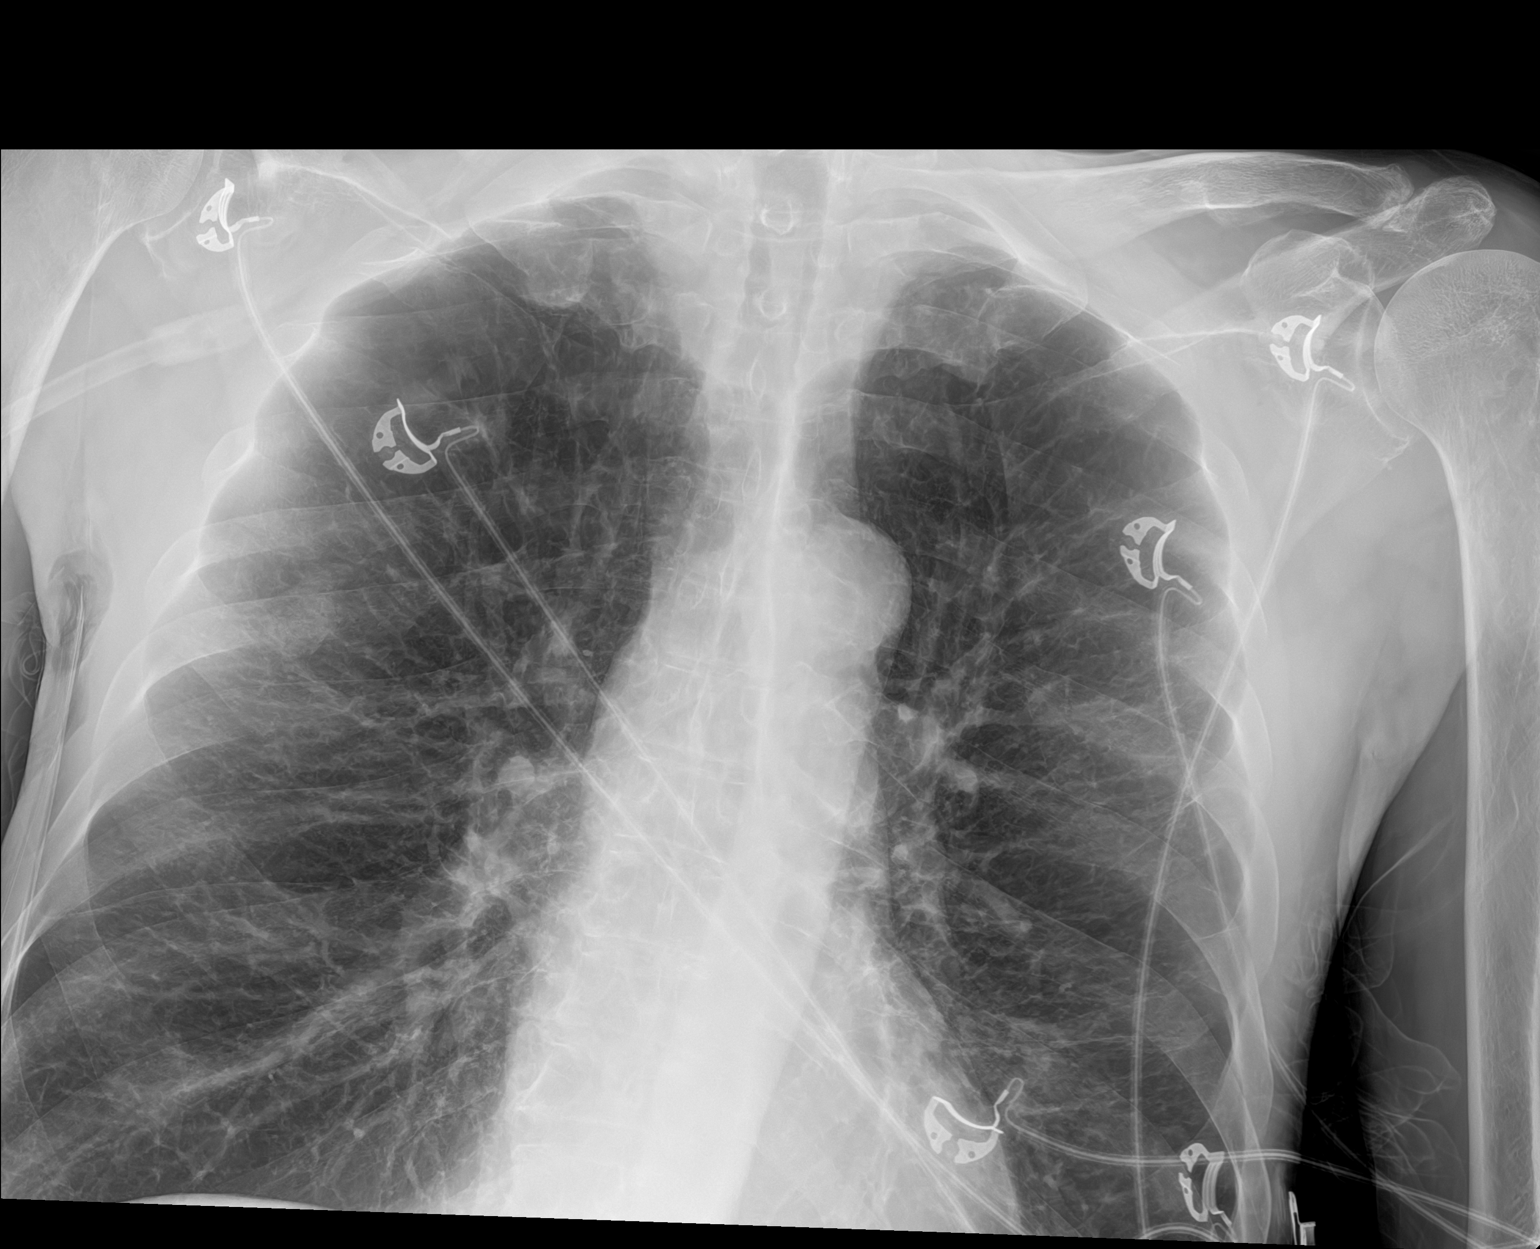

[chest ap (2 of 3)]
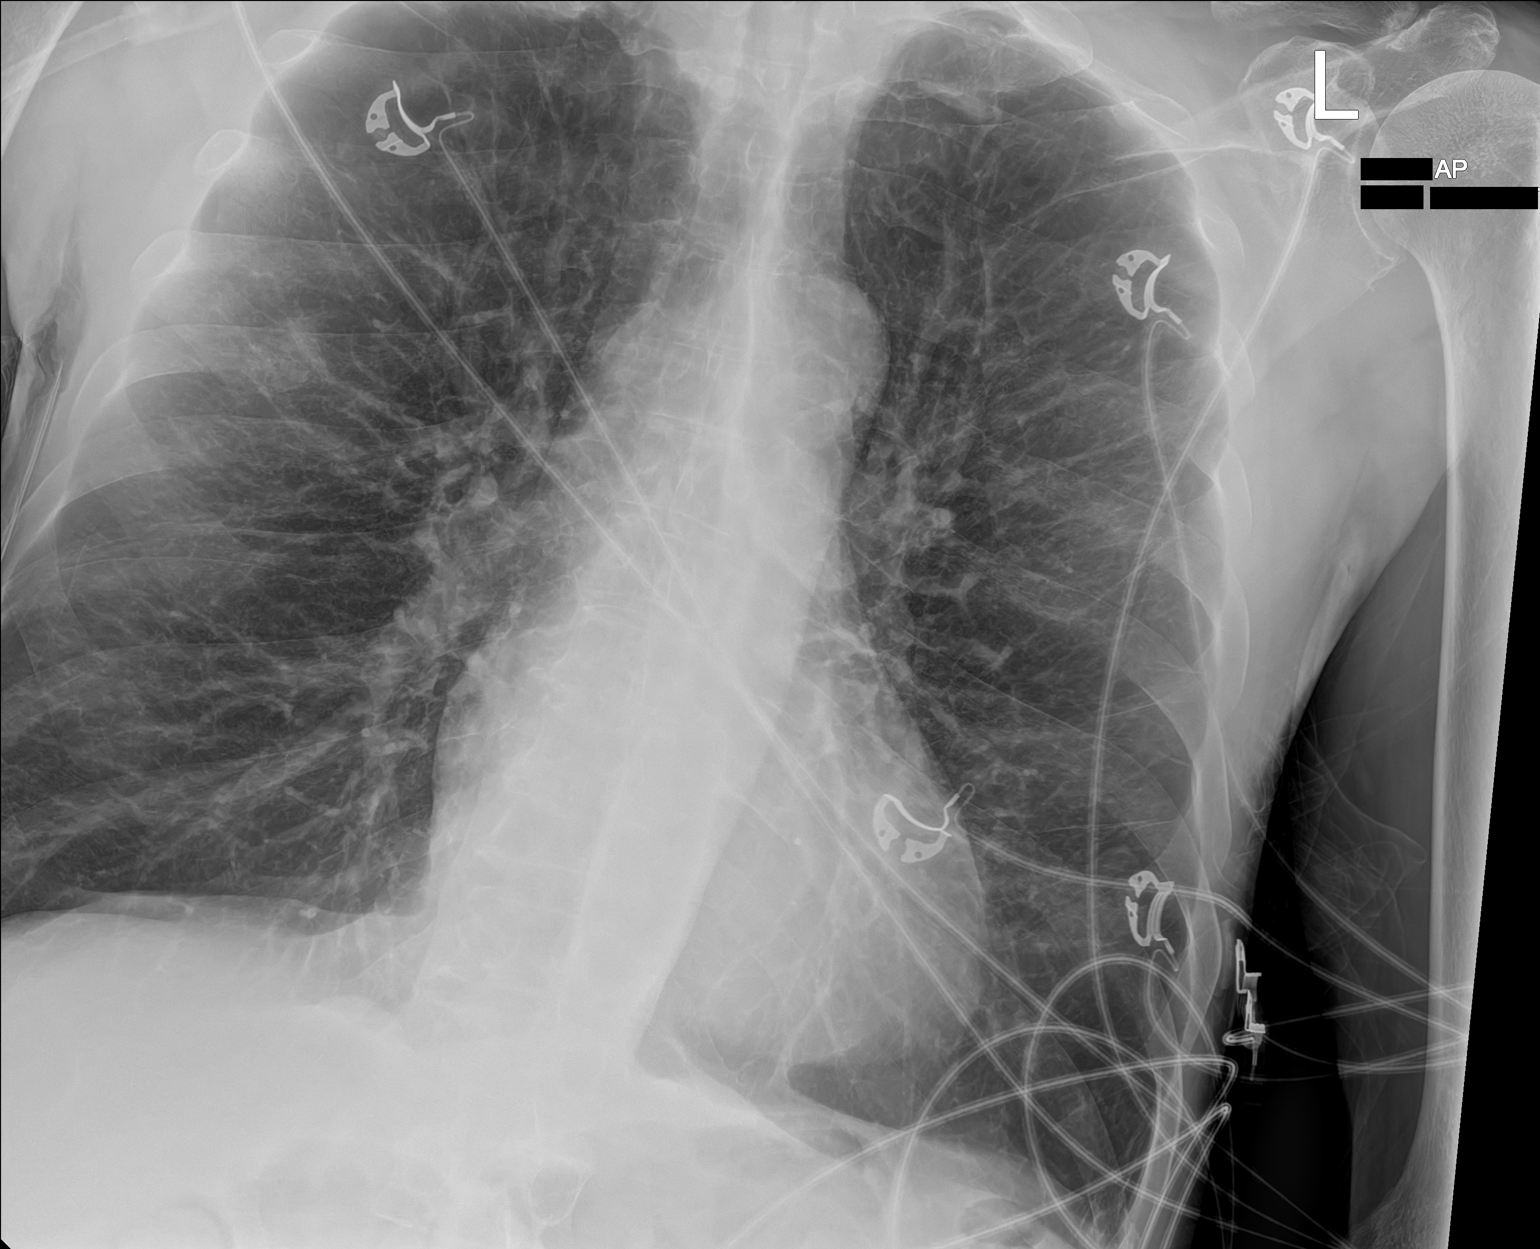

[chest ap (3 of 3)]
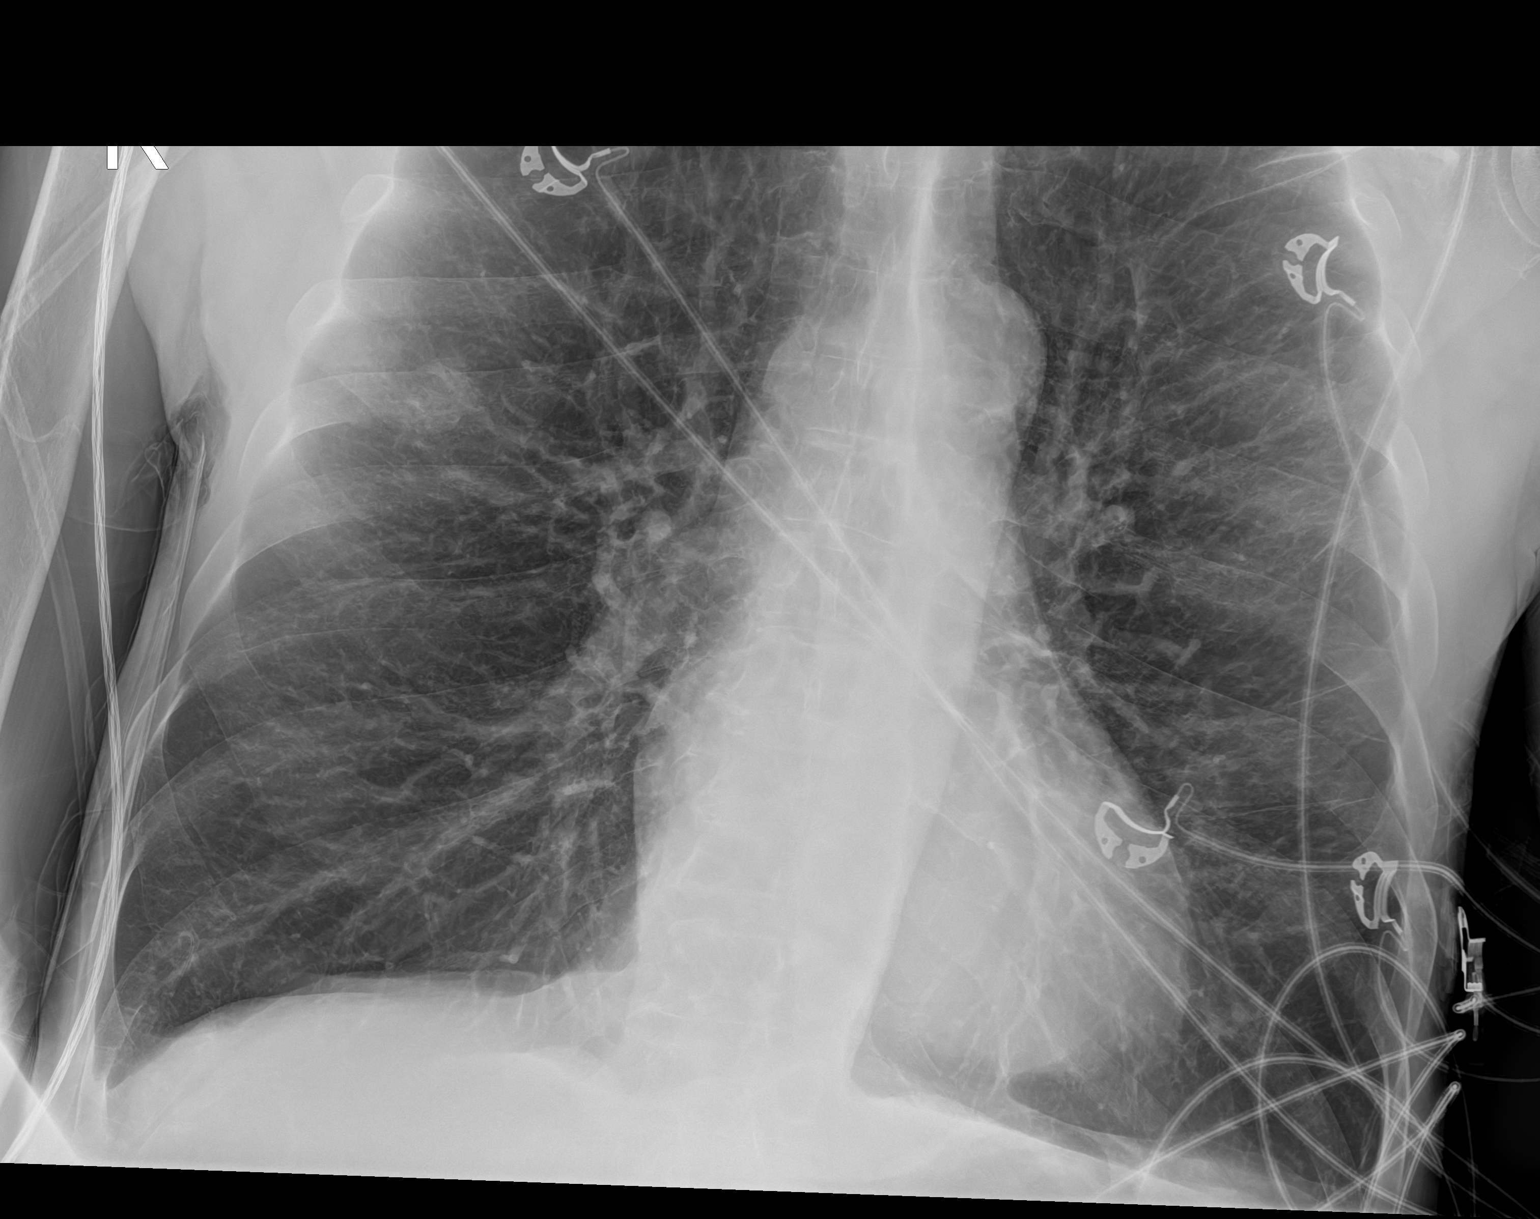

[3 of 3 positions shown; findings below may reference images not displayed]

FINDINGS: The heart size and mediastinal contours are within normal limits.
Pulmonary hyperinflation and emphysema. The visualized skeletal
structures are unremarkable.
IMPRESSION: Pulmonary hyperinflation and emphysema. No acute abnormality of the
lungs in AP portable projection.

## 2021-12-15 MED ORDER — ALBUTEROL SULFATE (2.5 MG/3ML) 0.083% IN NEBU
5.0000 mg | INHALATION_SOLUTION | Freq: Once | RESPIRATORY_TRACT | Status: AC
  Administered 2021-12-15: 5 mg via RESPIRATORY_TRACT
  Filled 2021-12-15: qty 6

## 2021-12-15 MED ORDER — ALUM & MAG HYDROXIDE-SIMETH 200-200-20 MG/5ML PO SUSP
30.0000 mL | Freq: Once | ORAL | Status: AC
  Administered 2021-12-15: 30 mL via ORAL
  Filled 2021-12-15: qty 30

## 2021-12-15 MED ORDER — DICYCLOMINE HCL 20 MG PO TABS: 20.0000 mg | ORAL_TABLET | Freq: Two times a day (BID) | ORAL | 0 refills | Status: DC | PRN

## 2021-12-15 MED ORDER — DICYCLOMINE HCL 10 MG PO CAPS
20.0000 mg | ORAL_CAPSULE | Freq: Once | ORAL | Status: AC
Start: 2021-12-15 — End: 2021-12-15
  Administered 2021-12-15: 20 mg via ORAL
  Filled 2021-12-15: qty 2

## 2021-12-15 MED ORDER — ALBUTEROL SULFATE HFA 108 (90 BASE) MCG/ACT IN AERS
1.0000 | INHALATION_SPRAY | Freq: Once | RESPIRATORY_TRACT | Status: AC
  Administered 2021-12-15: 1 via RESPIRATORY_TRACT
  Filled 2021-12-15: qty 6.7

## 2021-12-15 NOTE — Discharge Instructions (Signed)
Lab work imaging all reassuring, possibly warm slightly constipated, recommend taking MiraLAX next 7 days, 1 capful daily please drink plenty of water as this medication only works as long as you are hydrated.  We also given you Bentyl please take as prescribed.  Follow-up with your PCP as needed.  Come back to the emergency department if you develop chest pain, shortness of breath, severe abdominal pain, uncontrolled nausea, vomiting, diarrhea.

## 2021-12-15 NOTE — ED Notes (Signed)
X-ray at bedside

## 2021-12-15 NOTE — ED Provider Notes (Signed)
[facility] Provider Note   CSN: [id number] Arrival date & time: 12/15/21  1206     History  Chief Complaint  Patient presents with   Abdominal Pain    Tony Sullivan is a 61 y.o. male.  HPI  Patient with medical history including COPD, respiratory failure on 3 L via nasal cannula tobacco dependency GERD presents with complaints of stomach pain.  He states that started this morning, he states he feels some pressure in his upper abdominal region, does not radiate, does not describe as a tearing or stabbing-like sensation, no associated nausea or vomiting, no constipation diarrhea, states he had a normal bowel movement today, he has no fevers or chills no general body aches.  He  had a colonoscopy as well as appendectomy in the past no history of bowel obstructions, no history of stomach ulcers or diverticulitis, patient still tolerating p.o., has no complaints.  Home Medications Prior to Admission medications   Medication Sig Start Date End Date Taking? Authorizing Provider  dicyclomine (BENTYL) 20 MG tablet Take 1 tablet (20 mg total) by mouth 2 (two) times daily as needed for spasms. 12/15/21  Yes [name], PA-C  acetaminophen (TYLENOL) 325 MG tablet Take 2 tablets (650 mg total) by mouth every 6 (six) hours as needed for mild pain (or Fever >/= 101). 10/30/21   [name], MD  albuterol (PROVENTIL) (2.5 MG/3ML) 0.083% nebulizer solution Take 3 mLs (2.5 mg total) by nebulization every 4 (four) hours as needed for wheezing or shortness of breath. 12/12/21 12/12/22  [name], MD  albuterol (VENTOLIN HFA) 108 (90 Base) MCG/ACT inhaler Inhale 2 puffs into the lungs every 6 (six) hours as needed for wheezing or shortness of breath. 12/12/21   [name], MD  atorvastatin (LIPITOR) 40 MG tablet TAKE ONE TABLET BY MOUTH DAILY Patient taking differently: Take 40 mg by mouth daily. 07/06/20   [name], MD  azithromycin (ZITHROMAX) 250 MG tablet  Take as directed 11/16/21   [name], MD  benztropine (COGENTIN) 0.5 MG tablet Take 0.5 mg by mouth at bedtime.    [provider]  Budeson-Glycopyrrol-Formoterol (BREZTRI AEROSPHERE) 160-9-4.8 MCG/ACT AERO Inhale 2 puffs into the lungs in the morning and at bedtime. 12/12/21   [name], MD  cetirizine (ZYRTEC) 10 MG tablet Take 10 mg by mouth daily.    [provider]  dextromethorphan-guaiFENesin (MUCINEX DM) 30-600 MG 12hr tablet Take 1 tablet by mouth 2 (two) times daily. 10/30/21   [name], MD  diclofenac Sodium (VOLTAREN) 1 % GEL Apply 2 g topically 4 (four) times daily.    [provider]  dorzolamide (TRUSOPT) 2 % ophthalmic solution Place 1 drop into the left eye 2 (two) times daily. 12/12/20   [provider]  hydrochlorothiazide (HYDRODIURIL) 25 MG tablet Take 0.5 tablets (12.5 mg total) by mouth daily. 10/30/21   [name], MD  hydrOXYzine (ATARAX) 25 MG tablet Take 1 tablet (25 mg total) by mouth 3 (three) times daily as needed for anxiety or nausea. 10/30/21   Emokpae, Courage, MD  latanoprost (XALATAN) 0.005 % ophthalmic solution Place 1 drop into both eyes at bedtime. 05/24/19   [provider]  loxapine (LOXITANE) 10 MG capsule Take 10 mg by mouth at bedtime.     [provider]  megestrol (MEGACE) 40 MG tablet Take 40 mg by mouth daily.    [provider]  mirtazapine (REMERON) 30 MG tablet Take  1 tablet (30 mg total) by mouth at bedtime. 10/30/21   [name], MD  Multiple Vitamin (MULTIVITAMIN WITH MINERALS) TABS tablet Take 1 tablet by mouth daily. 10/31/21   [name], MD  OXYGEN Inhale 3-4 L into the lungs as directed. 3-4 liters with sleep and exertion as needed for low oxygen    [provider]  pantoprazole (PROTONIX) 40 MG tablet Take 1 tablet (40 mg total) by mouth daily. 10/30/21   [name], MD  polyethylene glycol (MIRALAX / GLYCOLAX) 17 g packet TAKE 17 GRAMS BY  MOUTH DAILY Patient taking differently: Take 17 g by mouth daily as needed for mild constipation. 03/09/20   [name], MD  predniSONE (DELTASONE) 10 MG tablet Take 2 tablets (20 mg total) by mouth 2 (two) times daily with a meal. 12/09/21 01/08/22  Fredia Sorrow, MD  risperiDONE (RISPERDAL) 2 MG tablet Take 1 tablet (2 mg total) by mouth at bedtime. 10/30/21   [name], MD  senna-docusate (SENOKOT-S) 8.6-50 MG tablet Take 2 tablets by mouth at bedtime. 10/30/21   [name], MD  simethicone (MYLICON) 40 QM/2.5OI drops Take 0.6 mLs (40 mg total) by mouth 4 (four) times daily as needed for flatulence. 10/17/21   Orpah Greek, MD      Allergies    Penicillins and Levocetirizine    Review of Systems   Review of Systems  Constitutional:  Negative for chills and fever.  Respiratory:  Negative for shortness of breath.   Cardiovascular:  Negative for chest pain.  Gastrointestinal:  Negative for abdominal pain, diarrhea, nausea and vomiting.       Abdominal pressure  Neurological:  Negative for headaches.   Physical Exam Updated Vital Signs BP 125/75    Pulse 74    Temp 98 F (36.7 C) (Oral)    Resp 16    Ht $R'5\' 10"'AA$  (1.778 m)    Wt 68.9 kg    SpO2 94%    BMI 21.81 kg/m  Physical Exam Vitals and nursing note reviewed.  Constitutional:      General: He is not in acute distress.    Appearance: He is not ill-appearing.  HENT:     Head: Normocephalic and atraumatic.     Nose: No congestion.     Mouth/Throat:     Mouth: Mucous membranes are moist.     Pharynx: Oropharynx is clear.  Eyes:     Conjunctiva/sclera: Conjunctivae normal.  Cardiovascular:     Rate and Rhythm: Normal rate and regular rhythm.     Pulses: Normal pulses.     Heart sounds: No murmur heard.   No friction rub. No gallop.  Pulmonary:     Effort: No respiratory distress.     Breath sounds: No wheezing, rhonchi or rales.     Comments: No signs of respiratory distress, able to speak in full  sentences, nontachypneic, nonhypoxic, on his baseline O2 of 3 L, patient had slightly tight sounding chest no wheezing or rhonchi present. Abdominal:     General: There is no distension.     Palpations: Abdomen is soft.     Tenderness: There is no abdominal tenderness. There is no right CVA tenderness or left CVA tenderness.     Comments: Abdomen nondistended, normal bowel sounds, dull to percussion, there is no guarding, rebound tenderness, peritoneal sign, patient was nontender on my exam, he states he just feels some pressure, negative Murphy sign McBurney point no CVA tenderness.  Musculoskeletal:  Right lower leg: No edema.     Left lower leg: No edema.  Skin:    General: Skin is warm and dry.  Neurological:     Mental Status: He is alert.  Psychiatric:        Mood and Affect: Mood normal.    ED Results / Procedures / Treatments   Labs (all labs ordered are listed, but only abnormal results are displayed) Labs Reviewed  COMPREHENSIVE METABOLIC PANEL - Abnormal; Notable for the following components:      Result Value   Potassium 3.3 (*)    CO2 33 (*)    Glucose, Bld 103 (*)    Calcium 8.5 (*)    Albumin 3.3 (*)    All other components within normal limits  CBC WITH DIFFERENTIAL/PLATELET - Abnormal; Notable for the following components:   WBC 14.1 (*)    MCV 102.5 (*)    Neutro Abs 12.6 (*)    Abs Immature Granulocytes 0.16 (*)    All other components within normal limits  RESP PANEL BY RT-PCR (FLU A&B, COVID) ARPGX2  LIPASE, BLOOD    EKG None  Radiology DG Abdomen 1 View  Result Date: 12/15/2021 CLINICAL DATA:  Abdominal pain, bowel obstruction EXAM: ABDOMEN - 1 VIEW COMPARISON:  CT 10/17/2021 FINDINGS: Nonobstructive bowel gas pattern. Moderate colonic stool burden. No acute osseous abnormality. IMPRESSION: No evidence of bowel obstruction. Electronically Signed   By: Maurine Simmering M.D.   On: 12/15/2021 15:36   DG Chest Port 1 View  Result Date:  12/15/2021 CLINICAL DATA:  Cough, COPD, chest pain EXAM: PORTABLE CHEST 1 VIEW COMPARISON:  12/09/2021 FINDINGS: The heart size and mediastinal contours are within normal limits. Pulmonary hyperinflation and emphysema. The visualized skeletal structures are unremarkable. IMPRESSION: Pulmonary hyperinflation and emphysema. No acute abnormality of the lungs in AP portable projection. Electronically Signed   By: Delanna Ahmadi M.D.   On: 12/15/2021 13:03    Procedures Procedures    Medications Ordered in ED Medications  albuterol (VENTOLIN HFA) 108 (90 Base) MCG/ACT inhaler 1 puff (has no administration in time range)  albuterol (PROVENTIL) (2.5 MG/3ML) 0.083% nebulizer solution 5 mg (5 mg Nebulization Given 12/15/21 1351)  alum & mag hydroxide-simeth (MAALOX/MYLANTA) 200-200-20 MG/5ML suspension 30 mL (30 mLs Oral Given 12/15/21 1255)  dicyclomine (BENTYL) capsule 20 mg (20 mg Oral Given 12/15/21 1255)    ED Course/ Medical Decision Making/ A&P                           Medical Decision Making Amount and/or Complexity of Data Reviewed Labs: ordered. Radiology: ordered.  Risk OTC drugs. Prescription drug management.   This patient presents to the ED for concern of abdominal tightness, this involves an extensive number of treatment options, and is a complaint that carries with it a high risk of complications and morbidity.  The differential diagnosis includes bowel obstruction, volvulus, appendicitis lower lobe pneumonia    Additional history obtained:  Additional history obtained from electronic medical record    Co morbidities that complicate the patient evaluation  COPD  Social Determinants of Health:  N/A    Lab Tests:  I Ordered, and personally interpreted labs.  The pertinent results include: Leukocytosis of 14.1, CMP shows potassium 3.3 CO2 of 33 glucose 103 calcium 8.5 albumin 3.3 lipase 48, respiratory panel unremarkable   Imaging Studies ordered:  I ordered  imaging studies including chest x-ray I independently visualized and interpreted imaging  which showed unremarkable for acute findings I agree with the radiologist interpretation    Reevaluation:  Patient lung sounds were slightly tight sounding on my exam, provided with a  nebulizing.  He was reassessed lung sounds have improved no new oxygen requirements.   will continue to monitor   On arrival patient presents with abdominal tightness, patient was provided with GI cocktail, Bentyl.  He was reassessed he was found asleep in bed, vital signs have remained stable, patient states she is feeling better, abdomen  reassessed nontender on my exam.  States he feels slight tightness but has no other complaints.   Rule out low suspicion for lower lobe pneumonia as lung sounds are clear bilaterally, chest x-ray is unremarkable.  I have low suspicion for liver or gallbladder abnormality as he has no right upper quadrant tenderness, liver enzymes, alk phos, T bili all within normal limits.  Low suspicion for pancreatitis as lipase is within normal limits.  Low suspicion for ruptured stomach ulcer as she has no peritoneal sign present on exam.  Low suspicion for bowel obstruction as abdomen is nondistended normal bowel sounds,  passing gas, no nausea or vomiting  having normal bowel movements.  Low suspicion for complicated diverticulitis as he is nontoxic-appearing, abdomen is soft nontender to palpation.  Does note that he has slight leukocytosis but appears this is chronic for him, he is on chronic steroids and likely the cause of his increased leukocytosis.  I have low suspicion for intra-abdominal infection as she has low risk factors, vital signs reassuring, no leukocytosis, will defer imaging at this time.  Low suspicion for COPD exacerbation as she has no new oxygen requirements, no new sputum production, no wheezing or rhonchi present my exam. he states he is out of his inhaler we will write him a new  prescription.     Dispostion and problem list  After consideration of the diagnostic results and the patients response to treatment, I feel that the patent would benefit from discharge.  Abdominal pressure-unclear etiology but could be multifactorial does have hyperinflated lungs which could apply pressure into his lower abdomen causing a pressure-like sensation, versus possible constipation.  Will recommend MiraLAX, prior with Bentyl, given strict return precautions.            Final Clinical Impression(s) / ED Diagnoses Final diagnoses:  Generalized abdominal pain    Rx / DC Orders ED Discharge Orders          Ordered    dicyclomine (BENTYL) 20 MG tablet  2 times daily PRN        12/15/21 1543              [name], PA-C 12/15/21 [place] A, DO 12/15/21 1614

## 2021-12-15 NOTE — ED Triage Notes (Signed)
Patient reports abdominal pain that started this am. States that belly feels tight causing him to not be able to breathe good. Requesting breathing treatment, also complains of chest tightness.

## 2021-12-27 ENCOUNTER — Encounter (HOSPITAL_COMMUNITY): Payer: Self-pay

## 2021-12-27 ENCOUNTER — Emergency Department (HOSPITAL_COMMUNITY)
Admission: EM | Admit: 2021-12-27 | Discharge: 2021-12-27 | Disposition: A | Discharge status: Home / Self Care | Payer: Medicaid Other | Attending: Emergency Medicine | Admitting: Emergency Medicine

## 2021-12-27 ENCOUNTER — Other Ambulatory Visit: Payer: Self-pay

## 2021-12-27 ENCOUNTER — Emergency Department (HOSPITAL_COMMUNITY): Payer: Medicaid Other

## 2021-12-27 DIAGNOSIS — J449 Chronic obstructive pulmonary disease, unspecified: Secondary | ICD-10-CM | POA: Insufficient documentation

## 2021-12-27 DIAGNOSIS — D72829 Elevated white blood cell count, unspecified: Secondary | ICD-10-CM | POA: Diagnosis not present

## 2021-12-27 DIAGNOSIS — R0602 Shortness of breath: Secondary | ICD-10-CM | POA: Diagnosis not present

## 2021-12-27 DIAGNOSIS — J209 Acute bronchitis, unspecified: Secondary | ICD-10-CM | POA: Diagnosis not present

## 2021-12-27 DIAGNOSIS — Z7951 Long term (current) use of inhaled steroids: Secondary | ICD-10-CM | POA: Diagnosis not present

## 2021-12-27 DIAGNOSIS — J40 Bronchitis, not specified as acute or chronic: Secondary | ICD-10-CM

## 2021-12-27 DIAGNOSIS — R531 Weakness: Secondary | ICD-10-CM | POA: Diagnosis present

## 2021-12-27 HISTORY — DX: Other psychoactive substance abuse, uncomplicated: F19.10

## 2021-12-27 LAB — CBC
HCT: 51.5 % (ref 39.0–52.0)
Hemoglobin: 16.4 g/dL (ref 13.0–17.0)
MCH: 32.1 pg (ref 26.0–34.0)
MCHC: 31.8 g/dL (ref 30.0–36.0)
MCV: 100.8 fL — ABNORMAL HIGH (ref 80.0–100.0)
Platelets: 235 10*3/uL (ref 150–400)
RBC: 5.11 MIL/uL (ref 4.22–5.81)
RDW: 15.4 % (ref 11.5–15.5)
WBC: 12.5 10*3/uL — ABNORMAL HIGH (ref 4.0–10.5)
nRBC: 0 % (ref 0.0–0.2)

## 2021-12-27 LAB — BASIC METABOLIC PANEL
Anion gap: 11 (ref 5–15)
BUN: 21 mg/dL — ABNORMAL HIGH (ref 6–20)
CO2: 31 mmol/L (ref 22–32)
Calcium: 9 mg/dL (ref 8.9–10.3)
Chloride: 95 mmol/L — ABNORMAL LOW (ref 98–111)
Creatinine, Ser: 0.92 mg/dL (ref 0.61–1.24)
GFR, Estimated: >60 mL/min (ref >60)
Glucose, Bld: 95 mg/dL (ref 70–99)
Potassium: 3.3 mmol/L — ABNORMAL LOW (ref 3.5–5.1)
Sodium: 137 mmol/L (ref 135–145)

## 2021-12-27 LAB — BRAIN NATRIURETIC PEPTIDE: B Natriuretic Peptide: 52 pg/mL (ref 0.0–100.0)

## 2021-12-27 LAB — TROPONIN I (HIGH SENSITIVITY)
Troponin I (High Sensitivity): 17 ng/L (ref <18)
Troponin I (High Sensitivity): 19 ng/L — ABNORMAL HIGH (ref <18)

## 2021-12-27 MED ORDER — DOXYCYCLINE HYCLATE 100 MG PO CAPS: 100.0000 mg | ORAL_CAPSULE | Freq: Two times a day (BID) | ORAL | 0 refills | Status: DC

## 2021-12-27 MED ORDER — METHYLPREDNISOLONE SODIUM SUCC 125 MG IJ SOLR
125.0000 mg | Freq: Once | INTRAMUSCULAR | Status: AC
  Administered 2021-12-27: 125 mg via INTRAMUSCULAR
  Filled 2021-12-27: qty 2

## 2021-12-27 MED ORDER — ALBUTEROL SULFATE (2.5 MG/3ML) 0.083% IN NEBU
2.5000 mg | INHALATION_SOLUTION | Freq: Once | RESPIRATORY_TRACT | Status: AC
  Administered 2021-12-27: 2.5 mg via RESPIRATORY_TRACT
  Filled 2021-12-27: qty 3

## 2021-12-27 MED ORDER — HYDROCODONE-ACETAMINOPHEN 5-325 MG PO TABS: 1.0000 | ORAL_TABLET | Freq: Four times a day (QID) | ORAL | 0 refills | Status: DC | PRN

## 2021-12-27 MED ORDER — IPRATROPIUM-ALBUTEROL 0.5-2.5 (3) MG/3ML IN SOLN
3.0000 mL | Freq: Once | RESPIRATORY_TRACT | Status: AC
  Administered 2021-12-27: 3 mL via RESPIRATORY_TRACT
  Filled 2021-12-27: qty 3

## 2021-12-27 NOTE — ED Notes (Signed)
Upon arrival patient was placed on hospital oxygen tank ?

## 2021-12-27 NOTE — Discharge Instructions (Signed)
Follow-up with your family doctor next week for recheck ?

## 2021-12-27 NOTE — ED Provider Notes (Signed)
[facility] Provider Note   CSN: [id number] Arrival date & time: 12/27/21  1234     History  Chief Complaint  Patient presents with   Leg Swelling   Weakness    Tony Sullivan is a 61 y.o. male.  Patient complains of coughing up yellow stuff and happens while he is lying.  Patient has a history of COPD and is on 4 L of oxygen  The history is provided by the patient and medical records. No language interpreter was used.  Weakness Severity:  Moderate Onset quality:  Sudden Timing:  Constant Progression:  Worsening Chronicity:  Recurrent Context: not alcohol use   Relieved by:  Nothing Worsened by:  Nothing Ineffective treatments:  None tried Associated symptoms: no abdominal pain, no chest pain, no cough, no diarrhea, no frequency, no headaches and no seizures       Home Medications Prior to Admission medications   Medication Sig Start Date End Date Taking? Authorizing Provider  doxycycline (VIBRAMYCIN) 100 MG capsule Take 1 capsule (100 mg total) by mouth 2 (two) times daily. One po bid x 7 days 12/27/21  Yes [name], MD  HYDROcodone-acetaminophen (NORCO/VICODIN) 5-325 MG tablet Take 1 tablet by mouth every 6 (six) hours as needed. 12/27/21  Yes [name], MD  acetaminophen (TYLENOL) 325 MG tablet Take 2 tablets (650 mg total) by mouth every 6 (six) hours as needed for mild pain (or Fever >/= 101). 10/30/21   [name], MD  albuterol (PROVENTIL) (2.5 MG/3ML) 0.083% nebulizer solution Take 3 mLs (2.5 mg total) by nebulization every 4 (four) hours as needed for wheezing or shortness of breath. 12/12/21 12/12/22  [name], MD  albuterol (VENTOLIN HFA) 108 (90 Base) MCG/ACT inhaler Inhale 2 puffs into the lungs every 6 (six) hours as needed for wheezing or shortness of breath. 12/12/21   [name], MD  atorvastatin (LIPITOR) 40 MG tablet TAKE ONE TABLET BY MOUTH DAILY Patient taking differently: Take 40 mg by mouth daily. 07/06/20    [name], MD  azithromycin (ZITHROMAX) 250 MG tablet Take as directed 11/16/21   [name], MD  benztropine (COGENTIN) 0.5 MG tablet Take 0.5 mg by mouth at bedtime.    [provider]  Budeson-Glycopyrrol-Formoterol (BREZTRI AEROSPHERE) 160-9-4.8 MCG/ACT AERO Inhale 2 puffs into the lungs in the morning and at bedtime. 12/12/21   [name], MD  cetirizine (ZYRTEC) 10 MG tablet Take 10 mg by mouth daily.    [provider]  dextromethorphan-guaiFENesin (MUCINEX DM) 30-600 MG 12hr tablet Take 1 tablet by mouth 2 (two) times daily. 10/30/21   [name], MD  diclofenac Sodium (VOLTAREN) 1 % GEL Apply 2 g topically 4 (four) times daily.    [provider]  dicyclomine (BENTYL) 20 MG tablet Take 1 tablet (20 mg total) by mouth 2 (two) times daily as needed for spasms. 12/15/21   [name], PA-C  dorzolamide (TRUSOPT) 2 % ophthalmic solution Place 1 drop into the left eye 2 (two) times daily. 12/12/20   [provider]  hydrochlorothiazide (HYDRODIURIL) 25 MG tablet Take 0.5 tablets (12.5 mg total) by mouth daily. 10/30/21   [name], MD  hydrOXYzine (ATARAX) 25 MG tablet Take 1 tablet (25 mg total) by mouth 3 (three) times daily as needed for anxiety or nausea. 10/30/21   Emokpae, Courage, MD  latanoprost (XALATAN) 0.005 % ophthalmic solution Place 1 drop into both eyes at bedtime. 05/24/19   [provider]  loxapine (LOXITANE) 10 MG capsule Take 10 mg by mouth at bedtime.     [provider]  megestrol (MEGACE) 40 MG tablet Take 40 mg by mouth daily.    [provider]  mirtazapine (REMERON) 30 MG tablet Take 1 tablet (30 mg total) by mouth at bedtime. 10/30/21   [name], MD  Multiple Vitamin (MULTIVITAMIN WITH MINERALS) TABS tablet Take 1 tablet by mouth daily. 10/31/21   [name], MD  OXYGEN Inhale 3-4 L into the lungs as directed. 3-4 liters with sleep and exertion as needed for low oxygen     [provider]  pantoprazole (PROTONIX) 40 MG tablet Take 1 tablet (40 mg total) by mouth daily. 10/30/21   [name], MD  polyethylene glycol (MIRALAX / GLYCOLAX) 17 g packet TAKE 17 GRAMS BY MOUTH DAILY Patient taking differently: Take 17 g by mouth daily as needed for mild constipation. 03/09/20   [name], MD  predniSONE (DELTASONE) 10 MG tablet Take 2 tablets (20 mg total) by mouth 2 (two) times daily with a meal. 12/09/21 01/08/22  Fredia Sorrow, MD  risperiDONE (RISPERDAL) 2 MG tablet Take 1 tablet (2 mg total) by mouth at bedtime. 10/30/21   [name], MD  senna-docusate (SENOKOT-S) 8.6-50 MG tablet Take 2 tablets by mouth at bedtime. 10/30/21   [name], MD  simethicone (MYLICON) 40 MW/1.0UV drops Take 0.6 mLs (40 mg total) by mouth 4 (four) times daily as needed for flatulence. 10/17/21   Orpah Greek, MD      Allergies    Penicillins and Levocetirizine    Review of Systems   Review of Systems  Constitutional:  Negative for appetite change and fatigue.  HENT:  Negative for congestion, ear discharge and sinus pressure.   Eyes:  Negative for discharge.  Respiratory:  Negative for cough.   Cardiovascular:  Negative for chest pain.  Gastrointestinal:  Negative for abdominal pain and diarrhea.  Genitourinary:  Negative for frequency and hematuria.  Musculoskeletal:  Negative for back pain.  Skin:  Negative for rash.  Neurological:  Positive for weakness. Negative for seizures and headaches.  Psychiatric/Behavioral:  Negative for hallucinations.    Physical Exam Updated Vital Signs BP 118/85    Pulse (!) 102    Temp 98.3 F (36.8 C) (Oral)    Resp (!) 22    Ht 5\' 10"  (1.778 m)    Wt 68.9 kg    SpO2 95%    BMI 21.81 kg/m  Physical Exam Vitals and nursing note reviewed.  Constitutional:      Appearance: He is well-developed.  HENT:     Head: Normocephalic.     Nose: Nose normal.  Eyes:     General: No scleral icterus.     Conjunctiva/sclera: Conjunctivae normal.  Neck:     Thyroid: No thyromegaly.  Cardiovascular:     Rate and Rhythm: Normal rate and regular rhythm.     Heart sounds: No murmur heard.   No friction rub. No gallop.  Pulmonary:     Breath sounds: No stridor. No wheezing or rales.  Chest:     Chest wall: No tenderness.  Abdominal:     General: There is no distension.     Tenderness: There is no abdominal tenderness. There is no rebound.  Musculoskeletal:        General: Normal range of motion.     Cervical back: Neck supple.  Lymphadenopathy:     Cervical: No  cervical adenopathy.  Skin:    Findings: No erythema or rash.  Neurological:     Mental Status: He is alert and oriented to person, place, and time.     Motor: No abnormal muscle tone.     Coordination: Coordination normal.  Psychiatric:        Behavior: Behavior normal.    ED Results / Procedures / Treatments   Labs (all labs ordered are listed, but only abnormal results are displayed) Labs Reviewed  BASIC METABOLIC PANEL - Abnormal; Notable for the following components:      Result Value   Potassium 3.3 (*)    Chloride 95 (*)    BUN 21 (*)    All other components within normal limits  CBC - Abnormal; Notable for the following components:   WBC 12.5 (*)    MCV 100.8 (*)    All other components within normal limits  TROPONIN I (HIGH SENSITIVITY) - Abnormal; Notable for the following components:   Troponin I (High Sensitivity) 19 (*)    All other components within normal limits  RESP PANEL BY RT-PCR (FLU A&B, COVID) ARPGX2  BRAIN NATRIURETIC PEPTIDE  TROPONIN I (HIGH SENSITIVITY)    EKG EKG Interpretation  Date/Time:  Thursday December 27 2021 12:59:10 EST Ventricular Rate:  100 PR Interval:  144 QRS Duration: 70 QT Interval:  356 QTC Calculation: 459 R Axis:   -89 Text Interpretation: Sinus rhythm with occasional Premature ventricular complexes Right atrial enlargement Left axis deviation Anteroseptal infarct  , age undetermined Abnormal ECG When compared with ECG of 15-Dec-2021 12:20, PREVIOUS ECG IS PRESENT Confirmed by [name] [id number]) on 12/27/2021 1:05:37 PM  Radiology DG Chest 2 View  Result Date: 12/27/2021 CLINICAL DATA:  Chest pain EXAM: CHEST - 2 VIEW COMPARISON:  12/15/2021 FINDINGS: The heart size and mediastinal contours are within normal limits. Both lungs are clear. The visualized skeletal structures are unremarkable. IMPRESSION: No active cardiopulmonary disease. Electronically Signed   By: Kathreen Devoid M.D.   On: 12/27/2021 13:21    Procedures Procedures    Medications Ordered in ED Medications  methylPREDNISolone sodium succinate (SOLU-MEDROL) 125 mg/2 mL injection 125 mg (125 mg Intramuscular Given 12/27/21 1649)  ipratropium-albuterol (DUONEB) 0.5-2.5 (3) MG/3ML nebulizer solution 3 mL (3 mLs Nebulization Given 12/27/21 1704)  albuterol (PROVENTIL) (2.5 MG/3ML) 0.083% nebulizer solution 2.5 mg (2.5 mg Nebulization Given 12/27/21 1704)    ED Course/ Medical Decision Making/ A&P                           Medical Decision Making Amount and/or Complexity of Data Reviewed Labs: ordered. Radiology: ordered.  Risk Prescription drug management.   This patient presents to the ED for concern of leg, this involves an extensive number of treatment options, and is a complaint that carries with it a high risk of complications and morbidity.  The differential diagnosis includes anemia.  Exacerbation of COPD   Co morbidities that complicate the patient evaluation  COPD   Additional history obtained:  Additional history obtained from patient External records from outside source obtained and reviewed including hospital record   Lab Tests:  I Ordered, and personally interpreted labs.  The pertinent results include: CBC and chemistries that showed elevated white count of 12.5, troponin stable at 19 chemistries unremarkable   Imaging Studies ordered:  I ordered imaging studies  including chest x-ray I independently visualized and interpreted imaging which showed no acute disease I agree with  the radiologist interpretation   Cardiac Monitoring:  The patient was maintained on a cardiac monitor.  I personally viewed and interpreted the cardiac monitored which showed an underlying rhythm of: Normal sinus   Medicines ordered and prescription drug management:  I ordered medication including albuterol and Atrovent for shortness of breath Reevaluation of the patient after these medicines showed that the patient improved I have reviewed the patients home medicines and have made adjustments as needed   Test Considered:  CT chest   Critical Interventions:  Albuterol Atrovent   Consultations Obtained: No consult Problem List / ED Course:  COPD    Social Determinants of Health:  None       Patient with bronchitis and COPD.  His oxygen is 94% on his normal 4 L.  Minimal discomfort to legs.  Patient is on prednisone now also we will start him on doxycycline and hydrocodone for his feet and follow-up with PCP        Final Clinical Impression(s) / ED Diagnoses Final diagnoses:  Bronchitis    Rx / DC Orders ED Discharge Orders          Ordered    doxycycline (VIBRAMYCIN) 100 MG capsule  2 times daily        12/27/21 1737    HYDROcodone-acetaminophen (NORCO/VICODIN) 5-325 MG tablet  Every 6 hours PRN        12/27/21 1737              [name], MD 12/29/21 [phone]

## 2021-12-27 NOTE — ED Triage Notes (Signed)
Patient reports 1 week ago had a sharp pain in neck and his arm is tightening up and left wrist is painful.  Patient also reports lower extremity swelling and admits to using crack 2 days ago.  Patient reports having a chest cold and coughing up light green mucous.  ?

## 2021-12-27 NOTE — ED Provider Triage Note (Signed)
Emergency Medicine Provider Triage Evaluation Note ? ?Tony Sullivan , a 61 y.o. male  was evaluated in triage.  Pt complains of multiple complaints including a 1 week history of sharp pain in his left bicep muscle of unclear etiology.  It started as pain in his neck which has resolved, now isolated to the left bicep region.  He denies injury.  Also reporting bilateral foot pain and swelling, cough which has been productive of a light green sputum.  He denies fevers or chills.  He is on chronic oxygen secondary to COPD.  He endorses using cocaine 2 days ago, he denies chest pain.  He also reports having a bedsore which is causing increasing pain. ? ?Review of Systems  ?Positive: Cough, shortness of breath, left bicep pain bilateral foot pain and edema ?Negative: Chest pain, fevers abdominal pain, nausea or vomiting. ? ?Physical Exam  ?BP 99/77 (BP Location: Right Arm)   Pulse 99   Temp 98.3 ?F (36.8 ?C) (Oral)   Resp 20   Ht 5\' 10"  (1.778 m)   Wt 68.9 kg   SpO2 93%   BMI 21.81 kg/m?  ?Gen:   Awake, no distress   ?Resp:  Normal effort , reduced respiratory sounds present, no wheezing or rhonchi, no respiratory distress. ?MSK:   Moves extremities without difficulty  ?Other:  No ankle or foot edema appreciated. ? ?Medical Decision Making  ?Medically screening exam initiated at 2:20 PM.  Appropriate orders placed.  Tony Sullivan was informed that the remainder of the evaluation will be completed by another provider, this initial triage assessment does not replace that evaluation, and the importance of remaining in the ED until their evaluation is complete. ? ?Labs and imaging ordered. ?  Evalee Jefferson, PA-C ?12/27/21 1426 ? ?

## 2022-01-07 ENCOUNTER — Inpatient Hospital Stay (HOSPITAL_COMMUNITY)
Admission: EM | Admit: 2022-01-07 | Discharge: 2022-01-09 | DRG: 194 | Disposition: A | Discharge status: Home / Self Care | Payer: Medicaid Other | Attending: Internal Medicine | Admitting: Internal Medicine

## 2022-01-07 ENCOUNTER — Emergency Department (HOSPITAL_COMMUNITY): Payer: Medicaid Other

## 2022-01-07 ENCOUNTER — Encounter (HOSPITAL_COMMUNITY): Payer: Self-pay | Admitting: Emergency Medicine

## 2022-01-07 ENCOUNTER — Other Ambulatory Visit: Payer: Self-pay

## 2022-01-07 DIAGNOSIS — R9431 Abnormal electrocardiogram [ECG] [EKG]: Secondary | ICD-10-CM

## 2022-01-07 DIAGNOSIS — I10 Essential (primary) hypertension: Secondary | ICD-10-CM | POA: Diagnosis present

## 2022-01-07 DIAGNOSIS — E46 Unspecified protein-calorie malnutrition: Secondary | ICD-10-CM | POA: Diagnosis present

## 2022-01-07 DIAGNOSIS — E876 Hypokalemia: Secondary | ICD-10-CM | POA: Diagnosis present

## 2022-01-07 DIAGNOSIS — F17200 Nicotine dependence, unspecified, uncomplicated: Secondary | ICD-10-CM | POA: Diagnosis present

## 2022-01-07 DIAGNOSIS — F17210 Nicotine dependence, cigarettes, uncomplicated: Secondary | ICD-10-CM | POA: Diagnosis present

## 2022-01-07 DIAGNOSIS — J9611 Chronic respiratory failure with hypoxia: Secondary | ICD-10-CM | POA: Diagnosis present

## 2022-01-07 DIAGNOSIS — D72829 Elevated white blood cell count, unspecified: Secondary | ICD-10-CM | POA: Diagnosis not present

## 2022-01-07 DIAGNOSIS — J302 Other seasonal allergic rhinitis: Secondary | ICD-10-CM | POA: Diagnosis present

## 2022-01-07 DIAGNOSIS — F32A Depression, unspecified: Secondary | ICD-10-CM | POA: Diagnosis present

## 2022-01-07 DIAGNOSIS — J441 Chronic obstructive pulmonary disease with (acute) exacerbation: Secondary | ICD-10-CM

## 2022-01-07 DIAGNOSIS — Z7952 Long term (current) use of systemic steroids: Secondary | ICD-10-CM

## 2022-01-07 DIAGNOSIS — Z20822 Contact with and (suspected) exposure to covid-19: Secondary | ICD-10-CM | POA: Diagnosis present

## 2022-01-07 DIAGNOSIS — Z79899 Other long term (current) drug therapy: Secondary | ICD-10-CM

## 2022-01-07 DIAGNOSIS — J449 Chronic obstructive pulmonary disease, unspecified: Secondary | ICD-10-CM | POA: Diagnosis not present

## 2022-01-07 DIAGNOSIS — D509 Iron deficiency anemia, unspecified: Secondary | ICD-10-CM | POA: Diagnosis present

## 2022-01-07 DIAGNOSIS — Z888 Allergy status to other drugs, medicaments and biological substances status: Secondary | ICD-10-CM | POA: Diagnosis not present

## 2022-01-07 DIAGNOSIS — Z880 Allergy status to penicillin: Secondary | ICD-10-CM | POA: Diagnosis not present

## 2022-01-07 DIAGNOSIS — K219 Gastro-esophageal reflux disease without esophagitis: Secondary | ICD-10-CM | POA: Diagnosis present

## 2022-01-07 DIAGNOSIS — Z825 Family history of asthma and other chronic lower respiratory diseases: Secondary | ICD-10-CM

## 2022-01-07 DIAGNOSIS — M542 Cervicalgia: Secondary | ICD-10-CM | POA: Diagnosis present

## 2022-01-07 DIAGNOSIS — E782 Mixed hyperlipidemia: Secondary | ICD-10-CM | POA: Diagnosis present

## 2022-01-07 DIAGNOSIS — Z9981 Dependence on supplemental oxygen: Secondary | ICD-10-CM | POA: Diagnosis not present

## 2022-01-07 DIAGNOSIS — J189 Pneumonia, unspecified organism: Secondary | ICD-10-CM | POA: Diagnosis present

## 2022-01-07 DIAGNOSIS — Z720 Tobacco use: Secondary | ICD-10-CM | POA: Diagnosis present

## 2022-01-07 DIAGNOSIS — Z8249 Family history of ischemic heart disease and other diseases of the circulatory system: Secondary | ICD-10-CM

## 2022-01-07 DIAGNOSIS — H409 Unspecified glaucoma: Secondary | ICD-10-CM | POA: Diagnosis present

## 2022-01-07 DIAGNOSIS — Z981 Arthrodesis status: Secondary | ICD-10-CM | POA: Diagnosis not present

## 2022-01-07 DIAGNOSIS — Z7951 Long term (current) use of inhaled steroids: Secondary | ICD-10-CM | POA: Diagnosis not present

## 2022-01-07 DIAGNOSIS — F419 Anxiety disorder, unspecified: Secondary | ICD-10-CM | POA: Diagnosis present

## 2022-01-07 DIAGNOSIS — E8809 Other disorders of plasma-protein metabolism, not elsewhere classified: Secondary | ICD-10-CM | POA: Diagnosis present

## 2022-01-07 DIAGNOSIS — J9610 Chronic respiratory failure, unspecified whether with hypoxia or hypercapnia: Secondary | ICD-10-CM

## 2022-01-07 DIAGNOSIS — J439 Emphysema, unspecified: Secondary | ICD-10-CM | POA: Diagnosis present

## 2022-01-07 LAB — COMPREHENSIVE METABOLIC PANEL
ALT: 28 U/L (ref 0–44)
ALT: 22 U/L (ref 0–44)
AST: 28 U/L (ref 15–41)
AST: 21 U/L (ref 15–41)
Albumin: 3.4 g/dL — ABNORMAL LOW (ref 3.5–5.0)
Albumin: 2.7 g/dL — ABNORMAL LOW (ref 3.5–5.0)
Alkaline Phosphatase: 68 U/L (ref 38–126)
Alkaline Phosphatase: 53 U/L (ref 38–126)
Anion gap: 12 (ref 5–15)
Anion gap: 9 (ref 5–15)
BUN: 20 mg/dL (ref 6–20)
BUN: 20 mg/dL (ref 6–20)
CO2: 34 mmol/L — ABNORMAL HIGH (ref 22–32)
CO2: 34 mmol/L — ABNORMAL HIGH (ref 22–32)
Calcium: 9.4 mg/dL (ref 8.9–10.3)
Calcium: 8.8 mg/dL — ABNORMAL LOW (ref 8.9–10.3)
Chloride: 93 mmol/L — ABNORMAL LOW (ref 98–111)
Chloride: 95 mmol/L — ABNORMAL LOW (ref 98–111)
Creatinine, Ser: 0.97 mg/dL (ref 0.61–1.24)
Creatinine, Ser: 0.93 mg/dL (ref 0.61–1.24)
GFR, Estimated: >60 mL/min (ref >60)
GFR, Estimated: >60 mL/min (ref >60)
Glucose, Bld: 86 mg/dL (ref 70–99)
Glucose, Bld: 85 mg/dL (ref 70–99)
Potassium: 3.3 mmol/L — ABNORMAL LOW (ref 3.5–5.1)
Potassium: 3.2 mmol/L — ABNORMAL LOW (ref 3.5–5.1)
Sodium: 139 mmol/L (ref 135–145)
Sodium: 138 mmol/L (ref 135–145)
Total Bilirubin: 0.9 mg/dL (ref 0.3–1.2)
Total Bilirubin: 0.7 mg/dL (ref 0.3–1.2)
Total Protein: 7.6 g/dL (ref 6.5–8.1)
Total Protein: 6.2 g/dL — ABNORMAL LOW (ref 6.5–8.1)

## 2022-01-07 LAB — CBC WITH DIFFERENTIAL/PLATELET
Abs Immature Granulocytes: 0.23 10*3/uL — ABNORMAL HIGH (ref 0.00–0.07)
Basophils Absolute: 0.1 10*3/uL (ref 0.0–0.1)
Basophils Relative: 1 %
Eosinophils Absolute: 0 10*3/uL (ref 0.0–0.5)
Eosinophils Relative: 0 %
HCT: 48.4 % (ref 39.0–52.0)
Hemoglobin: 15.3 g/dL (ref 13.0–17.0)
Immature Granulocytes: 1 %
Lymphocytes Relative: 17 %
Lymphs Abs: 2.8 10*3/uL (ref 0.7–4.0)
MCH: 31.9 pg (ref 26.0–34.0)
MCHC: 31.6 g/dL (ref 30.0–36.0)
MCV: 100.8 fL — ABNORMAL HIGH (ref 80.0–100.0)
Monocytes Absolute: 1.2 10*3/uL — ABNORMAL HIGH (ref 0.1–1.0)
Monocytes Relative: 7 %
Neutro Abs: 12.3 10*3/uL — ABNORMAL HIGH (ref 1.7–7.7)
Neutrophils Relative %: 74 %
Platelets: 262 10*3/uL (ref 150–400)
RBC: 4.8 MIL/uL (ref 4.22–5.81)
RDW: 14.8 % (ref 11.5–15.5)
WBC: 16.5 10*3/uL — ABNORMAL HIGH (ref 4.0–10.5)
nRBC: 0 % (ref 0.0–0.2)

## 2022-01-07 LAB — APTT: aPTT: 28 seconds (ref 24–36)

## 2022-01-07 LAB — CBC
HCT: 41.6 % (ref 39.0–52.0)
Hemoglobin: 12.8 g/dL — ABNORMAL LOW (ref 13.0–17.0)
MCH: 30.5 pg (ref 26.0–34.0)
MCHC: 30.8 g/dL (ref 30.0–36.0)
MCV: 99 fL (ref 80.0–100.0)
Platelets: 242 10*3/uL (ref 150–400)
RBC: 4.2 MIL/uL — ABNORMAL LOW (ref 4.22–5.81)
RDW: 14.7 % (ref 11.5–15.5)
WBC: 16.2 10*3/uL — ABNORMAL HIGH (ref 4.0–10.5)
nRBC: 0 % (ref 0.0–0.2)

## 2022-01-07 LAB — RESP PANEL BY RT-PCR (FLU A&B, COVID) ARPGX2
Influenza A by PCR: NEGATIVE
Influenza B by PCR: NEGATIVE
SARS Coronavirus 2 by RT PCR: NEGATIVE

## 2022-01-07 LAB — BRAIN NATRIURETIC PEPTIDE: B Natriuretic Peptide: 40 pg/mL (ref 0.0–100.0)

## 2022-01-07 LAB — PHOSPHORUS: Phosphorus: 1.9 mg/dL — ABNORMAL LOW (ref 2.5–4.6)

## 2022-01-07 LAB — PROTIME-INR
INR: 1 (ref 0.8–1.2)
Prothrombin Time: 12.8 seconds (ref 11.4–15.2)

## 2022-01-07 LAB — LACTIC ACID, PLASMA: Lactic Acid, Venous: 1.5 mmol/L (ref 0.5–1.9)

## 2022-01-07 LAB — MAGNESIUM: Magnesium: 2 mg/dL (ref 1.7–2.4)

## 2022-01-07 IMAGING — DX DG CHEST 1V PORT
2 series · 2 of 2 positions shown · non-contrast
Comparison: PA Lat [DATE] and chest CTA [DATE]

CLINICAL DATA: Questionable sepsis.

EXAM:
PORTABLE CHEST 1 VIEW

[chest ap (1 of 2)]
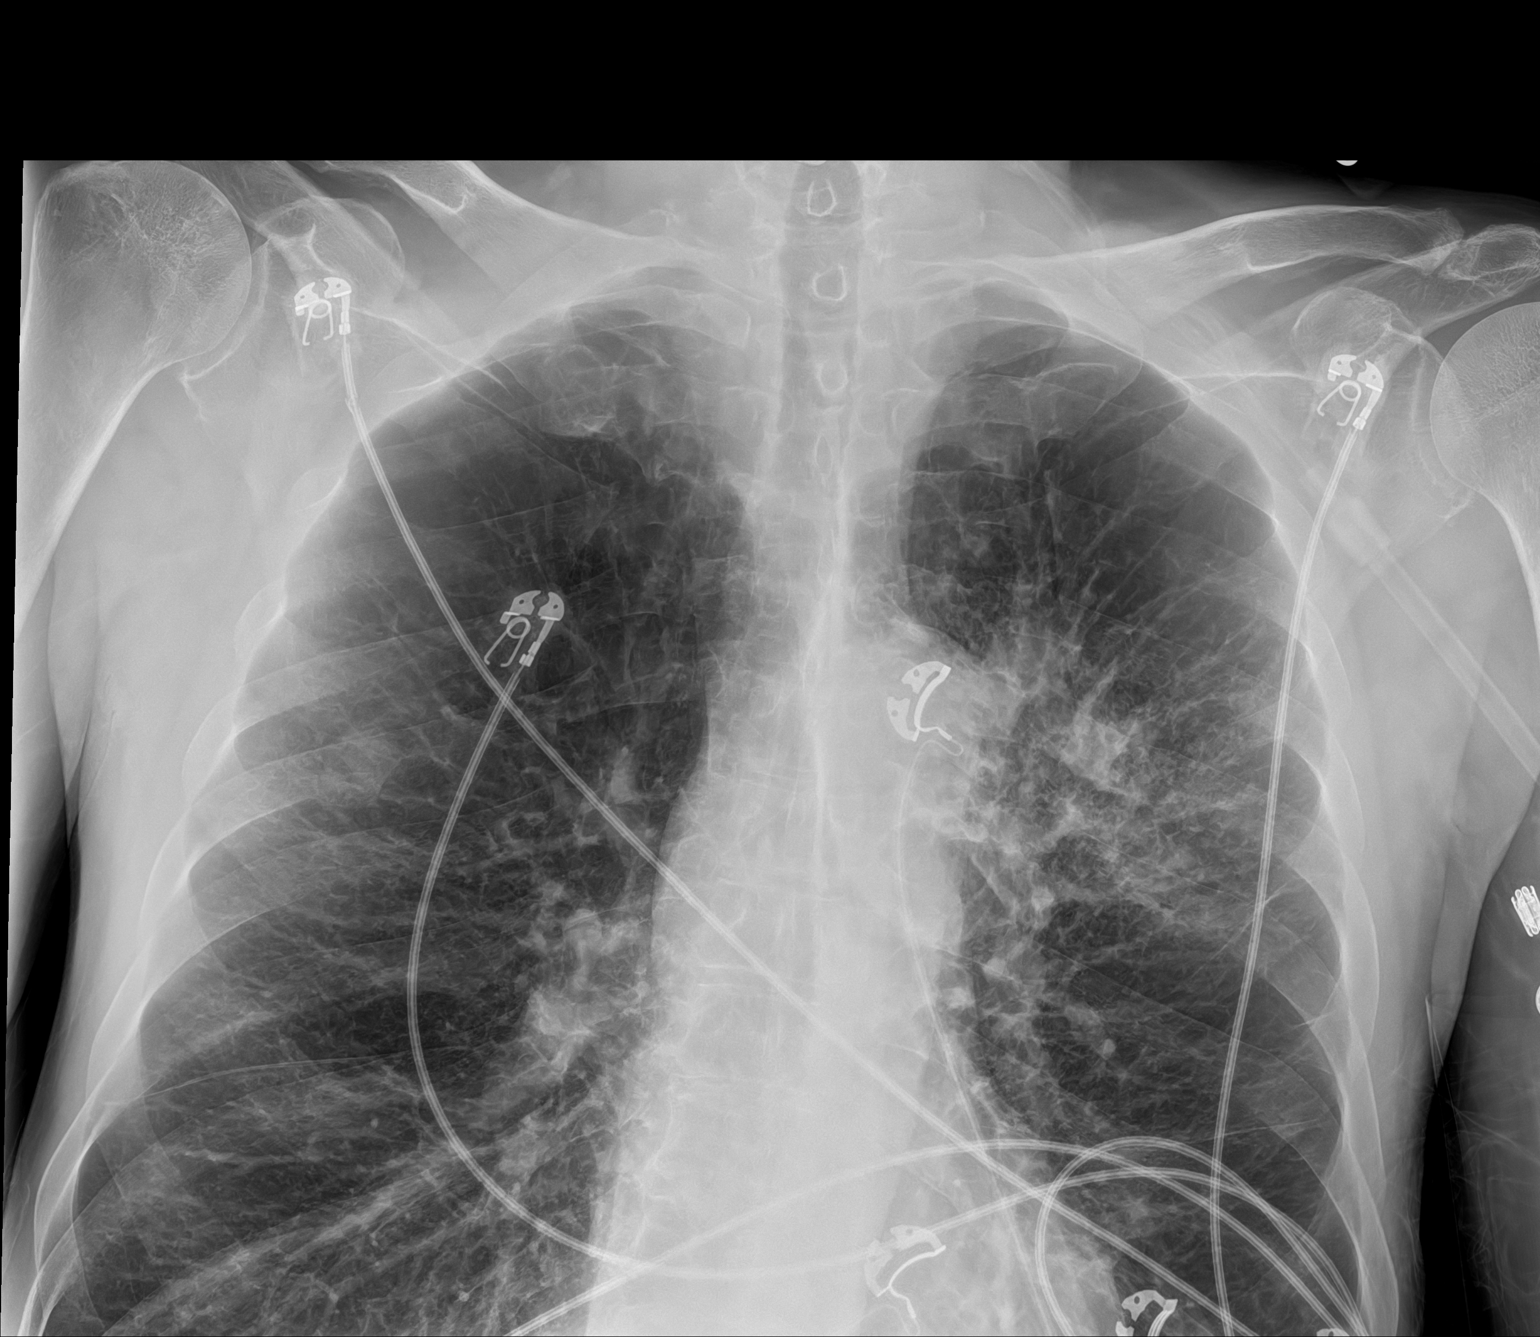

[chest ap (2 of 2)]
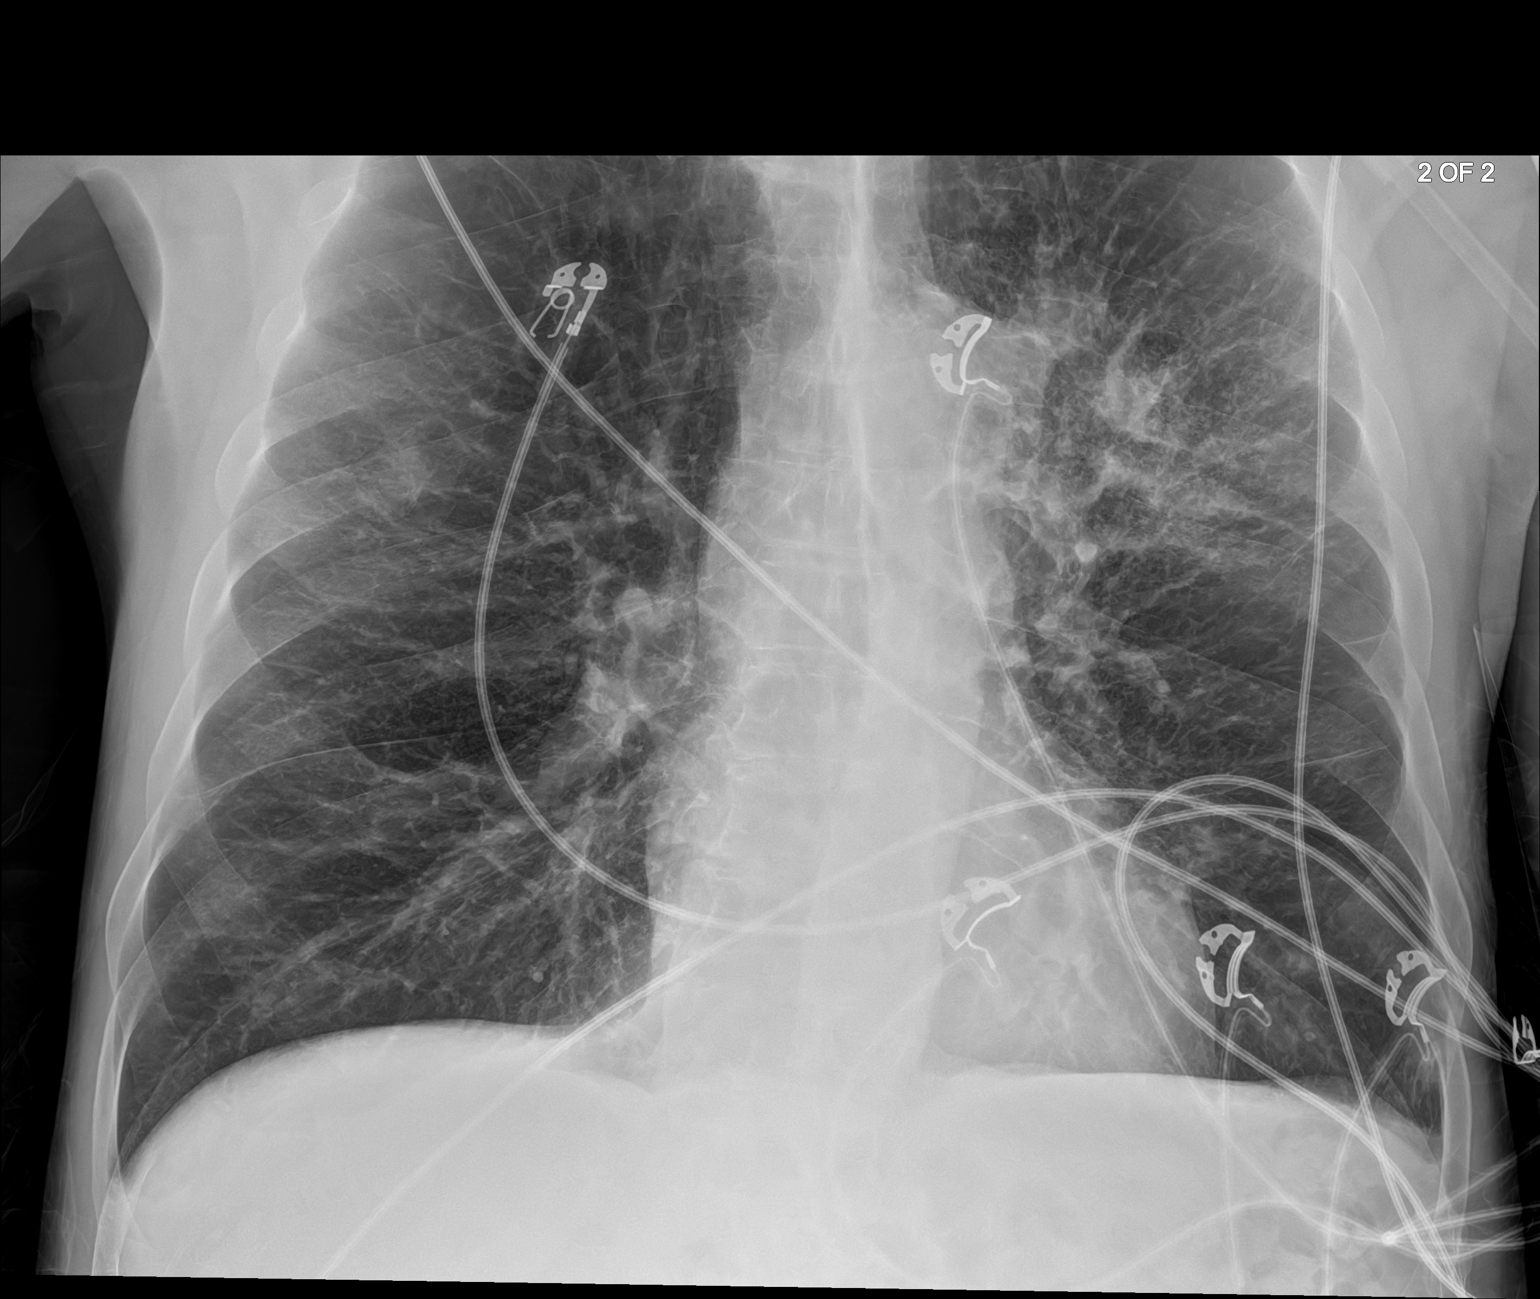

[2 of 2 positions shown; findings below may reference images not displayed]

FINDINGS: There is a patchy perihilar infiltrate developed in the left upper
lobe with opacities extending to the periphery. Findings consistent
with pneumonic infiltrate given the short interval time frame of its
appearance since 11 days ago.

Remainder of the lungs show severe emphysematous changes and are
otherwise clear.

The mediastinum is stable with aortic atherosclerosis and
tortuosity. Cardiac size is normal. No pleural effusion is seen.

There is osteopenia, mild degenerative change of the thoracic spine
and old plate hardware in the lower cervical spine.

There is a partially healed fracture again noted posterior right
sixth rib.
IMPRESSION: Left upper lobe perihilar infiltrate consistent with pneumonia.
Severe COPD. Follow-up study recommended to ensure clearing after
treatment.

## 2022-01-07 MED ORDER — BUDESON-GLYCOPYRROL-FORMOTEROL 160-9-4.8 MCG/ACT IN AERO: 2.0000 | INHALATION_SPRAY | Freq: Two times a day (BID) | RESPIRATORY_TRACT | Status: DC

## 2022-01-07 MED ORDER — DORZOLAMIDE HCL 2 % OP SOLN
1.0000 [drp] | Freq: Two times a day (BID) | OPHTHALMIC | Status: DC
  Administered 2022-01-07 – 2022-01-09 (×5): 1 [drp] via OPHTHALMIC
  Filled 2022-01-07 (×2): qty 10

## 2022-01-07 MED ORDER — MIRTAZAPINE 15 MG PO TABS
30.0000 mg | ORAL_TABLET | Freq: Every day | ORAL | Status: DC
  Filled 2022-01-07 (×2): qty 2

## 2022-01-07 MED ORDER — LEVOFLOXACIN IN D5W 750 MG/150ML IV SOLN
750.0000 mg | Freq: Once | INTRAVENOUS | Status: AC
  Administered 2022-01-07: 750 mg via INTRAVENOUS
  Filled 2022-01-07: qty 150

## 2022-01-07 MED ORDER — HYDROCODONE-ACETAMINOPHEN 5-325 MG PO TABS: 1.0000 | ORAL_TABLET | Freq: Four times a day (QID) | ORAL | Status: DC | PRN

## 2022-01-07 MED ORDER — PREDNISONE 20 MG PO TABS
20.0000 mg | ORAL_TABLET | Freq: Two times a day (BID) | ORAL | Status: DC
  Administered 2022-01-07 – 2022-01-09 (×5): 20 mg via ORAL
  Filled 2022-01-07 (×5): qty 1

## 2022-01-07 MED ORDER — SENNOSIDES-DOCUSATE SODIUM 8.6-50 MG PO TABS
2.0000 | ORAL_TABLET | Freq: Every day | ORAL | Status: DC
  Filled 2022-01-07 (×2): qty 2

## 2022-01-07 MED ORDER — BENZTROPINE MESYLATE 1 MG PO TABS
0.5000 mg | ORAL_TABLET | Freq: Every day | ORAL | Status: DC
  Filled 2022-01-07 (×2): qty 1

## 2022-01-07 MED ORDER — PANTOPRAZOLE SODIUM 40 MG PO TBEC
40.0000 mg | DELAYED_RELEASE_TABLET | Freq: Every day | ORAL | Status: DC
  Administered 2022-01-07 – 2022-01-09 (×3): 40 mg via ORAL
  Filled 2022-01-07 (×3): qty 1

## 2022-01-07 MED ORDER — IPRATROPIUM-ALBUTEROL 0.5-2.5 (3) MG/3ML IN SOLN
3.0000 mL | Freq: Four times a day (QID) | RESPIRATORY_TRACT | Status: DC | PRN
Start: 2022-01-07 — End: 2022-01-09
  Administered 2022-01-08: 3 mL via RESPIRATORY_TRACT
  Filled 2022-01-07: qty 3

## 2022-01-07 MED ORDER — ATORVASTATIN CALCIUM 40 MG PO TABS
40.0000 mg | ORAL_TABLET | Freq: Every day | ORAL | Status: DC
Start: 2022-01-07 — End: 2022-01-09
  Administered 2022-01-07 – 2022-01-09 (×3): 40 mg via ORAL
  Filled 2022-01-07 (×3): qty 1

## 2022-01-07 MED ORDER — SIMETHICONE 80 MG PO CHEW
40.0000 mg | CHEWABLE_TABLET | Freq: Four times a day (QID) | ORAL | Status: DC | PRN
  Filled 2022-01-07: qty 1

## 2022-01-07 MED ORDER — LATANOPROST 0.005 % OP SOLN
1.0000 [drp] | Freq: Every day | OPHTHALMIC | Status: DC
  Administered 2022-01-07 – 2022-01-08 (×2): 1 [drp] via OPHTHALMIC
  Filled 2022-01-07: qty 2.5

## 2022-01-07 MED ORDER — RISPERIDONE 1 MG PO TABS
2.0000 mg | ORAL_TABLET | Freq: Every day | ORAL | Status: DC
  Filled 2022-01-07 (×2): qty 2

## 2022-01-07 MED ORDER — ENOXAPARIN SODIUM 40 MG/0.4ML IJ SOSY
40.0000 mg | PREFILLED_SYRINGE | INTRAMUSCULAR | Status: DC
  Administered 2022-01-08 – 2022-01-09 (×2): 40 mg via SUBCUTANEOUS
  Filled 2022-01-07 (×3): qty 0.4

## 2022-01-07 MED ORDER — ACETAMINOPHEN 325 MG PO TABS
650.0000 mg | ORAL_TABLET | Freq: Once | ORAL | Status: AC
  Administered 2022-01-07: 650 mg via ORAL
  Filled 2022-01-07: qty 2

## 2022-01-07 MED ORDER — POTASSIUM CHLORIDE CRYS ER 20 MEQ PO TBCR
40.0000 meq | EXTENDED_RELEASE_TABLET | Freq: Once | ORAL | Status: AC
  Administered 2022-01-07: 40 meq via ORAL
  Filled 2022-01-07: qty 2

## 2022-01-07 MED ORDER — LORATADINE 10 MG PO TABS
10.0000 mg | ORAL_TABLET | Freq: Every day | ORAL | Status: DC
  Administered 2022-01-07 – 2022-01-09 (×3): 10 mg via ORAL
  Filled 2022-01-07 (×3): qty 1

## 2022-01-07 MED ORDER — DICYCLOMINE HCL 10 MG PO CAPS: 20.0000 mg | ORAL_CAPSULE | Freq: Two times a day (BID) | ORAL | Status: DC | PRN

## 2022-01-07 MED ORDER — ENSURE ENLIVE PO LIQD
237.0000 mL | Freq: Two times a day (BID) | ORAL | Status: DC
  Administered 2022-01-07 – 2022-01-09 (×4): 237 mL via ORAL

## 2022-01-07 MED ORDER — UMECLIDINIUM BROMIDE 62.5 MCG/ACT IN AEPB
1.0000 | INHALATION_SPRAY | Freq: Every day | RESPIRATORY_TRACT | Status: DC
  Administered 2022-01-07 – 2022-01-09 (×3): 1 via RESPIRATORY_TRACT
  Filled 2022-01-07: qty 7

## 2022-01-07 MED ORDER — FLUTICASONE FUROATE-VILANTEROL 200-25 MCG/ACT IN AEPB
1.0000 | INHALATION_SPRAY | Freq: Every day | RESPIRATORY_TRACT | Status: DC
Start: 2022-01-07 — End: 2022-01-09
  Administered 2022-01-07 – 2022-01-09 (×3): 1 via RESPIRATORY_TRACT
  Filled 2022-01-07: qty 28

## 2022-01-07 MED ORDER — LEVOFLOXACIN IN D5W 750 MG/150ML IV SOLN
750.0000 mg | INTRAVENOUS | Status: DC
  Administered 2022-01-08 – 2022-01-09 (×2): 750 mg via INTRAVENOUS
  Filled 2022-01-07 (×2): qty 150

## 2022-01-07 MED ORDER — DM-GUAIFENESIN ER 30-600 MG PO TB12
1.0000 | ORAL_TABLET | Freq: Two times a day (BID) | ORAL | Status: DC
  Administered 2022-01-07 – 2022-01-09 (×5): 1 via ORAL
  Filled 2022-01-07 (×5): qty 1

## 2022-01-07 NOTE — Assessment & Plan Note (Signed)
QTc 499 ms ?Avoid QT prolonging drugs ?Magnesium level was normal at 2 ?K+ 3.3; this will be replenished ?Repeat EKG in the morning ? ?

## 2022-01-07 NOTE — Progress Notes (Signed)
?  Transition of Care (TOC) Screening Note ? ? ?Patient Details  ?Name: Tony Sullivan ?Date of Birth: 1960-11-11 ? ? ?Transition of Care (TOC) CM/SW Contact:    ?Iona Beard, LCSWA ?Phone Number: ?01/07/2022, 2:29 PM ? ?Per most recent chart review, patient from home. Admitted with COPD. On home oxygen, uses wheelchair. Has aide through [employer] 2.5-3 hours, seven days a week. He is receiving SA services form an agency however, he does not know the agency name. BSC was ordered and delivered by [employer].  ? ?Transition of Care Department [facility]) has reviewed patient and no TOC needs have been identified at this time. We will continue to monitor patient advancement through interdisciplinary progression rounds. If new patient transition needs arise, please place a TOC consult. ?  ?

## 2022-01-07 NOTE — Assessment & Plan Note (Signed)
Patient was counseled on tobacco abuse cessation ?

## 2022-01-07 NOTE — Assessment & Plan Note (Signed)
-  Continue Lipitor °

## 2022-01-07 NOTE — Progress Notes (Signed)
Per HPI: [name] is a 61 y.o. male with medical history significant of   COPD on 4 LPM of home oxygen, GERD, hypertension, anxiety, osteoarthritis, tobacco and cocaine use and history of cervical fusion who presents to the emergency department due to worsening shortness of breath which has been going on for over a week.  Patient was seen in the ED on 3/2, was provided with breathing treatment and Solu-Medrol and was discharged home with doxycycline and Norco.  He complained of increasing work of breathing despite using home breathing treatments and also complained of an aching pain in neck and back today, so he activated EMS who noted him to have tachycardia and low-grade fever. ? ?01/07/22: Patient was admitted with community-acquired pneumonia and started on IV Levaquin.  He continues to have some mild nausea as well as ongoing cough.  Plan will be to continue IV Levaquin at this time and monitor for symptomatic improvement prior to considering discharge. Repeat AM labs. Wears 3-4 L [place] at home. ? ?Tried calling Tony Sullivan, Sr. at [phone] (Step father) with no response. ? ?Total care time: 25 minutes. ?

## 2022-01-07 NOTE — Assessment & Plan Note (Signed)
Continue latanoprost ?

## 2022-01-07 NOTE — H&P (Signed)
History and Physical    Patient: Tony Sullivan CBS:[id number] DOB: 07-18-1961 DOA: 01/07/2022 DOS: the patient was seen and examined on 01/07/2022 PCP: [name], FNP  Patient coming from: Home  Chief Complaint:  Chief Complaint  Patient presents with   Shortness of Breath   HPI: CRUZE ZINGARO is a 61 y.o. male with medical history significant of   COPD on 4 LPM of home oxygen, GERD, hypertension, anxiety, osteoarthritis, tobacco and cocaine use and history of cervical fusion who presents to the emergency department due to worsening shortness of breath which has been going on for over a week.  Patient was seen in the ED on 3/2, was provided with breathing treatment and Solu-Medrol and was discharged home with doxycycline and Norco.  He complained of increasing work of breathing despite using home breathing treatments and also complained of an aching pain in neck and back today, so he activated EMS who noted him to have tachycardia and low-grade fever.  ED course: In the emergency department, was noted to be febrile with a temperature of 100.6, was tachypneic and initially tachycardic.  O2 sat was 95-99% on supplemental oxygen of 4 LPM.  Work-up in the ED showed leukocytosis, microcytic anemia, hypokalemia.  Albumin 3.4, influenza A, B, SARS coronavirus 2 was negative. Chest x-ray showed left upper lobe perihilar infiltrate consistent with pneumonia. He was treated with IV Levaquin, Tylenol was given.  Hospitalist was asked to admit patient for further evaluation and management.  Review of Systems: As mentioned in the history of present illness. All other systems reviewed and are negative.  Past Medical History:  Diagnosis Date   Anxiety    Arthritis    COPD (chronic obstructive pulmonary disease) ([place]) 2004   diagnosed in 2004, no PFT's to date.  Started on home O2 12/2013, after found to be desatting at PCP's office, and referred to pulmonology.   Depression    GERD  (gastroesophageal reflux disease)    High blood pressure    Prediabetes    Seasonal allergies    Substance abuse ([place])    Tobacco dependence    Past Surgical History:  Procedure Laterality Date   ANTERIOR CERVICAL DECOMP/DISCECTOMY FUSION N/A 09/06/2019   Procedure: Anterior Cervical Discecectomy Fusion - Cerivcal Five-Cervical Six;  Surgeon: Kary Kos, MD;  Location: [place];  Service: Neurosurgery;  Laterality: N/A;  Anterior Cervical Discecectomy Fusion - Cerivcal Five-Cervical Six   ANTERIOR CERVICAL DISCECTOMY  09/06/2019   APPENDECTOMY  1980   COLONOSCOPY WITH PROPOFOL N/A 12/10/2016   Procedure: COLONOSCOPY WITH PROPOFOL;  Surgeon: [name], MD;  Location: [facility];  Service: Endoscopy;  Laterality: N/A;   HIP SURGERY Right    SHOULDER ARTHROSCOPY Right    Social History:  reports that he has been smoking cigarettes. He has a 9.00 pack-year smoking history. He has never used smokeless tobacco. He reports that he does not currently use alcohol after a past usage of about 14.0 - 21.0 standard drinks per week. He reports current drug use. Drugs: Marijuana and Cocaine.  Allergies  Allergen Reactions   Penicillins Anaphylaxis    Has patient had a PCN reaction causing immediate rash, facial/tongue/throat swelling, SOB or lightheadedness with hypotension: Yes Has patient had a PCN reaction causing severe rash involving mucus membranes or skin necrosis: No Has patient had a PCN reaction that required hospitalization No Has patient had a PCN reaction occurring within the last 10 years: No If all of the above answers are "  NO", then may proceed with Cephalosporin use.    Levocetirizine Other (See Comments)    Muscle cramps    Family History  Problem Relation Age of Onset   Emphysema Mother    Allergies Mother        "everyone in family"   Asthma Mother        "everyone in family"   Heart disease Mother    Clotting disorder Mother    Cancer Mother    Emphysema Father     Allergies Father    Allergies Son    Seizures Brother    Diabetes Brother     Prior to Admission medications   Medication Sig Start Date End Date Taking? Authorizing Provider  acetaminophen (TYLENOL) 325 MG tablet Take 2 tablets (650 mg total) by mouth every 6 (six) hours as needed for mild pain (or Fever >/= 101). 10/30/21   [name], MD  albuterol (PROVENTIL) (2.5 MG/3ML) 0.083% nebulizer solution Take 3 mLs (2.5 mg total) by nebulization every 4 (four) hours as needed for wheezing or shortness of breath. 12/12/21 12/12/22  [name], MD  albuterol (VENTOLIN HFA) 108 (90 Base) MCG/ACT inhaler Inhale 2 puffs into the lungs every 6 (six) hours as needed for wheezing or shortness of breath. 12/12/21   [name], MD  atorvastatin (LIPITOR) 40 MG tablet TAKE ONE TABLET BY MOUTH DAILY Patient taking differently: Take 40 mg by mouth daily. 07/06/20   [name], MD  azithromycin (ZITHROMAX) 250 MG tablet Take as directed 11/16/21   [name], MD  benztropine (COGENTIN) 0.5 MG tablet Take 0.5 mg by mouth at bedtime.    [provider]  Budeson-Glycopyrrol-Formoterol (BREZTRI AEROSPHERE) 160-9-4.8 MCG/ACT AERO Inhale 2 puffs into the lungs in the morning and at bedtime. 12/12/21   [name], MD  cetirizine (ZYRTEC) 10 MG tablet Take 10 mg by mouth daily.    [provider]  dextromethorphan-guaiFENesin (MUCINEX DM) 30-600 MG 12hr tablet Take 1 tablet by mouth 2 (two) times daily. 10/30/21   [name], MD  diclofenac Sodium (VOLTAREN) 1 % GEL Apply 2 g topically 4 (four) times daily.    [provider]  dicyclomine (BENTYL) 20 MG tablet Take 1 tablet (20 mg total) by mouth 2 (two) times daily as needed for spasms. 12/15/21   [name], PA-C  dorzolamide (TRUSOPT) 2 % ophthalmic solution Place 1 drop into the left eye 2 (two) times daily. 12/12/20   [provider]  doxycycline (VIBRAMYCIN) 100 MG capsule Take 1 capsule (100 mg  total) by mouth 2 (two) times daily. One po bid x 7 days 12/27/21   [name], MD  hydrochlorothiazide (HYDRODIURIL) 25 MG tablet Take 0.5 tablets (12.5 mg total) by mouth daily. 10/30/21   [name], MD  HYDROcodone-acetaminophen (NORCO/VICODIN) 5-325 MG tablet Take 1 tablet by mouth every 6 (six) hours as needed. 12/27/21   [name], MD  hydrOXYzine (ATARAX) 25 MG tablet Take 1 tablet (25 mg total) by mouth 3 (three) times daily as needed for anxiety or nausea. 10/30/21   Emokpae, Courage, MD  latanoprost (XALATAN) 0.005 % ophthalmic solution Place 1 drop into both eyes at bedtime. 05/24/19   [provider]  loxapine (LOXITANE) 10 MG capsule Take 10 mg by mouth at bedtime.     [provider]  megestrol (MEGACE) 40 MG tablet Take 40 mg by mouth daily.    [provider]  mirtazapine (REMERON) 30 MG tablet  Take 1 tablet (30 mg total) by mouth at bedtime. 10/30/21   [name], MD  Multiple Vitamin (MULTIVITAMIN WITH MINERALS) TABS tablet Take 1 tablet by mouth daily. 10/31/21   [name], MD  OXYGEN Inhale 3-4 L into the lungs as directed. 3-4 liters with sleep and exertion as needed for low oxygen    [provider]  pantoprazole (PROTONIX) 40 MG tablet Take 1 tablet (40 mg total) by mouth daily. 10/30/21   [name], MD  polyethylene glycol (MIRALAX / GLYCOLAX) 17 g packet TAKE 17 GRAMS BY MOUTH DAILY Patient taking differently: Take 17 g by mouth daily as needed for mild constipation. 03/09/20   [name], MD  predniSONE (DELTASONE) 10 MG tablet Take 2 tablets (20 mg total) by mouth 2 (two) times daily with a meal. 12/09/21 01/08/22  Fredia Sorrow, MD  risperiDONE (RISPERDAL) 2 MG tablet Take 1 tablet (2 mg total) by mouth at bedtime. 10/30/21   [name], MD  senna-docusate (SENOKOT-S) 8.6-50 MG tablet Take 2 tablets by mouth at bedtime. 10/30/21   [name], MD  simethicone (MYLICON) 40 ZJ/6.7HA drops Take 0.6 mLs (40 mg  total) by mouth 4 (four) times daily as needed for flatulence. 10/17/21   Orpah Greek, MD    Physical Exam: Vitals:   01/07/22 0330 01/07/22 0400 01/07/22 0408 01/07/22 0444  BP: 107/69 118/78  113/78  Pulse: (!) 101 100  94  Resp: 20 (!) 23  20  Temp:   99.6 F (37.6 C) 99.1 F (37.3 C)  TempSrc:   Oral Oral  SpO2: 98% 98%  95%  Weight:      Height:       General:  Awake and alert and oriented x3. Not in any acute distress.  HEENT: NCAT.  PERRLA. EOMI. Sclerae anicteric.  Moist mucosal membranes. Neck: Neck supple without lymphadenopathy. No carotid bruits. No masses palpated.  Cardiovascular: Regular rate with normal S1-S2 sounds. No murmurs, rubs or gallops auscultated. No JVD.  Respiratory: Clear breath sounds.  No accessory muscle use. Abdomen: Soft, nontender, nondistended. Active bowel sounds. No masses or hepatosplenomegaly  Skin: No rashes, lesions, or ulcerations.  Dry, warm to touch. Musculoskeletal:  2+ dorsalis pedis and radial pulses. Good ROM.  No contractures  Psychiatric: Intact judgment and insight.  Mood appropriate to current condition. Neurologic: No focal neurological deficits. Strength is 5/5 x 4.  CN II - XII grossly intact.  Data Reviewed: EKG personally reviewed showed sinus tachycardia at rate of 129 beats per minute with VPCs and QTc 499 ms  Assessment and Plan: * CAP (community acquired pneumonia) Patient was started on IV Levaquin, we shall continue with same at this time with plan to de-escalate/discontinue based on blood culture, sputum culture, urine Legionella, strep pneumo and procalcitonin Continue Tylenol as needed Continue Mucinex, incentive spirometry, flutter valve     Prolonged QT interval QTc 499 ms Avoid QT prolonging drugs Magnesium level was normal at 2 K+ 3.3; this will be replenished Repeat EKG in the morning   Leukocytosis WBC 16.5, this is possibly a reactive (patient was on prednisone) vs infectious  process Continue treatment as described for pneumonia  COPD (chronic obstructive pulmonary disease) ([place]) Continue Breztri Aerosphere, Mucinex, prednisone  Glaucoma Continue latanoprost  Hypoalbuminemia due to protein-calorie malnutrition ([facility]) Albumin 3.4, protein supplement will be provided  Hypokalemia K+ is 3.3 K+ will be replenished Please monitor for AM K+ for further replenishmemnt   Mixed hyperlipidemia Continue Lipitor  GERD (  gastroesophageal reflux disease) Continue Protonix  Essential hypertension Controlled; HCTZ will be held at this time  Smoker Patient was counseled on tobacco abuse cessation      Advance Care Planning:   Code Status: Full Code   Consults: None  Family Communication: None at bedside  Severity of Illness: The appropriate patient status for this patient is INPATIENT. Inpatient status is judged to be reasonable and necessary in order to provide the required intensity of service to ensure the patient's safety. The patient's presenting symptoms, physical exam findings, and initial radiographic and laboratory data in the context of their chronic comorbidities is felt to place them at high risk for further clinical deterioration. Furthermore, it is not anticipated that the patient will be medically stable for discharge from the hospital within 2 midnights of admission.   * I certify that at the point of admission it is my clinical judgment that the patient will require inpatient hospital care spanning beyond 2 midnights from the point of admission due to high intensity of service, high risk for further deterioration and high frequency of surveillance required.*  Author: Bernadette Hoit, DO 01/07/2022 8:18 AM  For on call review [email or web address].

## 2022-01-07 NOTE — ED Triage Notes (Signed)
Pt to ed via [facility]: EMS called by pt for VS check; pt normally wears 4L [place] O2 at home ?Per EMS: ?O2 sats 90-98% ?HR 104-112 ? ?C/o sharp pain in right shoulder and neck.  ? ?Hx of emph./COPD/ CHF ?

## 2022-01-07 NOTE — Assessment & Plan Note (Signed)
Continue Breztri Aerosphere, Mucinex, prednisone ?

## 2022-01-07 NOTE — Progress Notes (Signed)
Patient refused to take Cogentin, Mirtazapine, Senna-docusate, and Risperidone. MD notified.  ?

## 2022-01-07 NOTE — Assessment & Plan Note (Signed)
Albumin 3.4, protein supplement will be provided ?

## 2022-01-07 NOTE — Assessment & Plan Note (Signed)
Patient was started on IV Levaquin, we shall continue with same at this time with plan to de-escalate/discontinue based on blood culture, sputum culture, urine Legionella, strep pneumo and procalcitonin ?Continue Tylenol as needed ?Continue Mucinex, incentive spirometry, flutter valve  ? ? ?

## 2022-01-07 NOTE — Assessment & Plan Note (Signed)
Controlled; HCTZ will be held at this time ?

## 2022-01-07 NOTE — ED Provider Notes (Signed)
[facility]  Provider Note  CSN: [id number] Arrival date & time: 01/07/22 0059  History Chief Complaint  Patient presents with   Shortness of Breath    Tony Sullivan is a 61 y.o. male with history of severe COPD, on 4L [place] oxygen at all times at home, continues to smoke occasionally reports some inceased SOB and aching pain in neck and back today. He has been coughing but about like usual. He has been using his nebs more often in recent days. EMS was called, SpO2 was adequate, he was noted to be tachycardic with low grade fever on arrival here. He has had some decreased urine output at home.    Home Medications Prior to Admission medications   Medication Sig Start Date End Date Taking? Authorizing Provider  acetaminophen (TYLENOL) 325 MG tablet Take 2 tablets (650 mg total) by mouth every 6 (six) hours as needed for mild pain (or Fever >/= 101). 10/30/21   [name], MD  albuterol (PROVENTIL) (2.5 MG/3ML) 0.083% nebulizer solution Take 3 mLs (2.5 mg total) by nebulization every 4 (four) hours as needed for wheezing or shortness of breath. 12/12/21 12/12/22  [name], MD  albuterol (VENTOLIN HFA) 108 (90 Base) MCG/ACT inhaler Inhale 2 puffs into the lungs every 6 (six) hours as needed for wheezing or shortness of breath. 12/12/21   [name], MD  atorvastatin (LIPITOR) 40 MG tablet TAKE ONE TABLET BY MOUTH DAILY Patient taking differently: Take 40 mg by mouth daily. 07/06/20   [name], MD  azithromycin (ZITHROMAX) 250 MG tablet Take as directed 11/16/21   [name], MD  benztropine (COGENTIN) 0.5 MG tablet Take 0.5 mg by mouth at bedtime.    [provider]  Budeson-Glycopyrrol-Formoterol (BREZTRI AEROSPHERE) 160-9-4.8 MCG/ACT AERO Inhale 2 puffs into the lungs in the morning and at bedtime. 12/12/21   [name], MD  cetirizine (ZYRTEC) 10 MG tablet Take 10 mg by mouth daily.    [provider]   dextromethorphan-guaiFENesin (MUCINEX DM) 30-600 MG 12hr tablet Take 1 tablet by mouth 2 (two) times daily. 10/30/21   [name], MD  diclofenac Sodium (VOLTAREN) 1 % GEL Apply 2 g topically 4 (four) times daily.    [provider]  dicyclomine (BENTYL) 20 MG tablet Take 1 tablet (20 mg total) by mouth 2 (two) times daily as needed for spasms. 12/15/21   [name], PA-C  dorzolamide (TRUSOPT) 2 % ophthalmic solution Place 1 drop into the left eye 2 (two) times daily. 12/12/20   [provider]  doxycycline (VIBRAMYCIN) 100 MG capsule Take 1 capsule (100 mg total) by mouth 2 (two) times daily. One po bid x 7 days 12/27/21   [name], MD  hydrochlorothiazide (HYDRODIURIL) 25 MG tablet Take 0.5 tablets (12.5 mg total) by mouth daily. 10/30/21   [name], MD  HYDROcodone-acetaminophen (NORCO/VICODIN) 5-325 MG tablet Take 1 tablet by mouth every 6 (six) hours as needed. 12/27/21   [name], MD  hydrOXYzine (ATARAX) 25 MG tablet Take 1 tablet (25 mg total) by mouth 3 (three) times daily as needed for anxiety or nausea. 10/30/21   Emokpae, Courage, MD  latanoprost (XALATAN) 0.005 % ophthalmic solution Place 1 drop into both eyes at bedtime. 05/24/19   [provider]  loxapine (LOXITANE) 10 MG capsule Take 10 mg by mouth at bedtime.     [provider]  megestrol (MEGACE) 40 MG tablet Take 40 mg by  mouth daily.    [provider]  mirtazapine (REMERON) 30 MG tablet Take 1 tablet (30 mg total) by mouth at bedtime. 10/30/21   [name], MD  Multiple Vitamin (MULTIVITAMIN WITH MINERALS) TABS tablet Take 1 tablet by mouth daily. 10/31/21   [name], MD  OXYGEN Inhale 3-4 L into the lungs as directed. 3-4 liters with sleep and exertion as needed for low oxygen    [provider]  pantoprazole (PROTONIX) 40 MG tablet Take 1 tablet (40 mg total) by mouth daily. 10/30/21   [name], MD  polyethylene glycol (MIRALAX /  GLYCOLAX) 17 g packet TAKE 17 GRAMS BY MOUTH DAILY Patient taking differently: Take 17 g by mouth daily as needed for mild constipation. 03/09/20   [name], MD  predniSONE (DELTASONE) 10 MG tablet Take 2 tablets (20 mg total) by mouth 2 (two) times daily with a meal. 12/09/21 01/08/22  Fredia Sorrow, MD  risperiDONE (RISPERDAL) 2 MG tablet Take 1 tablet (2 mg total) by mouth at bedtime. 10/30/21   [name], MD  senna-docusate (SENOKOT-S) 8.6-50 MG tablet Take 2 tablets by mouth at bedtime. 10/30/21   [name], MD  simethicone (MYLICON) 40 KG/4.0NU drops Take 0.6 mLs (40 mg total) by mouth 4 (four) times daily as needed for flatulence. 10/17/21   Orpah Greek, MD     Allergies    Penicillins and Levocetirizine   Review of Systems   Review of Systems Please see HPI for pertinent positives and negatives  Physical Exam BP 107/69    Pulse (!) 101    Temp (!) 100.6 F (38.1 C) (Oral)    Resp 20    Ht 5\' 10"  (1.778 m)    Wt 68.9 kg    SpO2 98%    BMI 21.79 kg/m   Physical Exam Vitals and nursing note reviewed.  Constitutional:      Appearance: Normal appearance.  HENT:     Head: Normocephalic and atraumatic.     Nose: Nose normal.     Mouth/Throat:     Mouth: Mucous membranes are moist.  Eyes:     Extraocular Movements: Extraocular movements intact.     Conjunctiva/sclera: Conjunctivae normal.  Cardiovascular:     Rate and Rhythm: Normal rate.  Pulmonary:     Effort: Pulmonary effort is normal. No respiratory distress.     Breath sounds: Normal breath sounds. No wheezing or rales.  Abdominal:     General: Abdomen is flat.     Palpations: Abdomen is soft.     Tenderness: There is no abdominal tenderness.  Musculoskeletal:        General: Tenderness (soft tissues of neck and shoulder) present. No swelling. Normal range of motion.     Cervical back: Neck supple.  Skin:    General: Skin is warm and dry.  Neurological:     General: No focal deficit  present.     Mental Status: He is alert.  Psychiatric:        Mood and Affect: Mood normal.    ED Results / Procedures / Treatments   EKG EKG Interpretation  Date/Time:  Monday January 07 2022 01:09:57 EDT Ventricular Rate:  129 PR Interval:  118 QRS Duration: 120 QT Interval:  340 QTC Calculation: 499 R Axis:   -88 Text Interpretation: Sinus tachycardia Ventricular premature complex Nonspecific IVCD with LAD Inferior infarct, old Consider anterior infarct Lateral leads are also involved No significant change since last tracing Confirmed by  Calvert Cantor [phone]) on 01/07/2022 1:46:26 AM  Procedures Procedures  Medications Ordered in the ED Medications  levofloxacin (LEVAQUIN) IVPB 750 mg (750 mg Intravenous New Bag/Given 01/07/22 0234)  acetaminophen (TYLENOL) tablet 650 mg (650 mg Oral Given 01/07/22 0148)    Initial Impression and Plan  Patient with acute on chronic SOB, here for similar recently, on long term steroids, has low grade fever and tachycardia on arrival. Unclear if this is an acute infectious process, will check labs, CXR, monitor in the ED. Currently sinus tachy on cardiac monitor  ED Course   Clinical Course as of 01/07/22 0339  Mon Jan 07, 2022  0158 I personally viewed the images from radiology studies and agree with radiologist interpretation: LUL infiltrate. Will begin CAP Abx, has PCN allergy, Levaquin ordered.   [CS]  3748 CBC with leukocytosis, otherwise normal.  [CS]  2707 Coags neg.  [CS]  0251 Lactic acid is normal.  [CS]  0303 CMP is unremarkable.  [CS]  [id number] Patient remains mildly tachycardic but no other signs of sepsis. Given he was recently on a course of doxycycline and worsened, will discuss admission with Hospitalist.  [CS]  0322 BNP is normal. Covid/Flu are neg.  [CS]  4492 Spoke with Dr. Josephine Cables, Hospitalist, who will evaluate for admission.  [CS]    Clinical Course User Index [CS] Truddie Hidden, MD     MDM  Rules/Calculators/A&P Medical Decision Making Problems Addressed: Chronic respiratory failure, unspecified whether with hypoxia or hypercapnia [facility]): chronic illness or injury Community acquired pneumonia of left upper lobe of lung: acute illness or injury that poses a threat to life or bodily functions  Amount and/or Complexity of Data Reviewed Labs: ordered. Decision-making details documented in ED Course. Radiology: ordered and independent interpretation performed. Decision-making details documented in ED Course. ECG/medicine tests: ordered and independent interpretation performed. Decision-making details documented in ED Course.  Risk OTC drugs. Prescription drug management. Decision regarding hospitalization.    Final Clinical Impression(s) / ED Diagnoses Final diagnoses:  Community acquired pneumonia of left upper lobe of lung  Chronic respiratory failure, unspecified whether with hypoxia or hypercapnia [facility])    Rx / DC Orders ED Discharge Orders     None        Truddie Hidden, MD 01/07/22 [phone]

## 2022-01-07 NOTE — Progress Notes (Signed)
Pharmacy Antibiotic Note ? ?Tony Sullivan is a 61 y.o. male admitted on 01/07/2022 with pneumonia.  Pharmacy has been consulted for Levaquin dosing. WBC is elevated. Renal function appears normal.  ? ?Plan: ?Levaquin 750 mg IV q24h ?Trend WBC, temp, renal function  ?F/U infectious work-up ? ? ?Height: 5\' 10"  (177.8 cm) ?Weight: 68.9 kg (151 lb 14.4 oz) ?IBW/kg (Calculated) : 73 ? ?Temp (24hrs), Avg:99.8 ?F (37.7 ?C), Min:99.1 ?F (37.3 ?C), Max:100.6 ?F (38.1 ?C) ? ?Recent Labs  ?Lab 01/07/22 ?0211  ?WBC 16.5*  ?CREATININE 0.97  ?LATICACIDVEN 1.5  ?  ?Estimated Creatinine Clearance: 80.9 mL/min (by C-G formula based on SCr of 0.97 mg/dL).   ? ?Allergies  ?Allergen Reactions  ? Penicillins Anaphylaxis  ?  Has patient had a PCN reaction causing immediate rash, facial/tongue/throat swelling, SOB or lightheadedness with hypotension: Yes ?Has patient had a PCN reaction causing severe rash involving mucus membranes or skin necrosis: No ?Has patient had a PCN reaction that required hospitalization No ?Has patient had a PCN reaction occurring within the last 10 years: No ?If all of the above answers are "NO", then may proceed with Cephalosporin use. ?  ? Levocetirizine Other (See Comments)  ?  Muscle cramps  ? ? ?Narda Bonds, PharmD, BCPS ?Clinical Pharmacist ?Phone: [phone] ? ?

## 2022-01-07 NOTE — Assessment & Plan Note (Signed)
K+ is 3.3 ?K+ will be replenished ?Please monitor for AM K+ for further replenishmemnt ? ?

## 2022-01-07 NOTE — Assessment & Plan Note (Addendum)
WBC 16.5, this is possibly a reactive (patient was on prednisone) vs infectious process ?Continue treatment as described for pneumonia ?

## 2022-01-07 NOTE — Assessment & Plan Note (Signed)
Continue Protonix °

## 2022-01-07 NOTE — ED Notes (Signed)
Provider at bedside

## 2022-01-08 DIAGNOSIS — J189 Pneumonia, unspecified organism: Secondary | ICD-10-CM | POA: Diagnosis not present

## 2022-01-08 LAB — CBC
HCT: 38.9 % — ABNORMAL LOW (ref 39.0–52.0)
Hemoglobin: 11.8 g/dL — ABNORMAL LOW (ref 13.0–17.0)
MCH: 30.3 pg (ref 26.0–34.0)
MCHC: 30.3 g/dL (ref 30.0–36.0)
MCV: 99.7 fL (ref 80.0–100.0)
Platelets: 210 10*3/uL (ref 150–400)
RBC: 3.9 MIL/uL — ABNORMAL LOW (ref 4.22–5.81)
RDW: 14.5 % (ref 11.5–15.5)
WBC: 14.9 10*3/uL — ABNORMAL HIGH (ref 4.0–10.5)
nRBC: 0 % (ref 0.0–0.2)

## 2022-01-08 LAB — BASIC METABOLIC PANEL
Anion gap: 9 (ref 5–15)
BUN: 20 mg/dL (ref 6–20)
CO2: 31 mmol/L (ref 22–32)
Calcium: 8.6 mg/dL — ABNORMAL LOW (ref 8.9–10.3)
Chloride: 95 mmol/L — ABNORMAL LOW (ref 98–111)
Creatinine, Ser: 0.83 mg/dL (ref 0.61–1.24)
GFR, Estimated: >60 mL/min (ref >60)
Glucose, Bld: 122 mg/dL — ABNORMAL HIGH (ref 70–99)
Potassium: 3.8 mmol/L (ref 3.5–5.1)
Sodium: 135 mmol/L (ref 135–145)

## 2022-01-08 LAB — MAGNESIUM: Magnesium: 2.2 mg/dL (ref 1.7–2.4)

## 2022-01-08 MED ORDER — ALBUTEROL SULFATE HFA 108 (90 BASE) MCG/ACT IN AERS: 1.0000 | INHALATION_SPRAY | Freq: Four times a day (QID) | RESPIRATORY_TRACT | Status: DC | PRN

## 2022-01-08 MED ORDER — ALBUTEROL SULFATE HFA 108 (90 BASE) MCG/ACT IN AERS: 2.0000 | INHALATION_SPRAY | RESPIRATORY_TRACT | Status: DC | PRN

## 2022-01-08 MED ORDER — SODIUM CHLORIDE 0.9 % IV SOLN: INTRAVENOUS | Status: DC | PRN

## 2022-01-08 MED ORDER — SIMETHICONE 80 MG PO CHEW
80.0000 mg | CHEWABLE_TABLET | Freq: Four times a day (QID) | ORAL | Status: DC | PRN
  Administered 2022-01-08: 80 mg via ORAL
  Filled 2022-01-08: qty 1

## 2022-01-08 NOTE — Progress Notes (Signed)
?PROGRESS NOTE ? ? ? ?Tony Sullivan  NOB:[id number] DOB: 02-Sep-1961 DOA: 01/07/2022 ?PCP: [name], FNP ? ? ?Brief Narrative:  ?Per HPI: ?Tony Sullivan is a 61 y.o. male with medical history significant of   COPD on 4 LPM of home oxygen, GERD, hypertension, anxiety, osteoarthritis, tobacco and cocaine use and history of cervical fusion who presents to the emergency department due to worsening shortness of breath which has been going on for over a week.  Patient was seen in the ED on 3/2, was provided with breathing treatment and Solu-Medrol and was discharged home with doxycycline and Norco.  He complained of increasing work of breathing despite using home breathing treatments and also complained of an aching pain in neck and back today, so he activated EMS who noted him to have tachycardia and low-grade fever. ?  ?01/07/22: Patient was admitted with community-acquired pneumonia and started on IV Levaquin.  He continues to have some mild nausea as well as ongoing cough.  Plan will be to continue IV Levaquin at this time and monitor for symptomatic improvement prior to considering discharge. Repeat AM labs. Wears 3-4 L [place] at home. ? ?01/08/22: Patient has some ongoing weakness and poor appetite.  Plan to continue current treatments.  ? ? ?Assessment & Plan: ?  ?Principal Problem: ?  CAP (community acquired pneumonia) ?Active Problems: ?  COPD (chronic obstructive pulmonary disease) ([place]) ?  Leukocytosis ?  Prolonged QT interval ?  Smoker ?  Essential hypertension ?  GERD (gastroesophageal reflux disease) ?  Mixed hyperlipidemia ?  Hypokalemia ?  Hypoalbuminemia due to protein-calorie malnutrition ([place]) ?  Glaucoma ? ?Assessment and Plan: ? ? ?CAP (community acquired pneumonia) ?Patient was started on IV Levaquin, we shall continue with same at this time with plan to de-escalate/discontinue based on blood culture, sputum culture, urine Legionella, strep pneumo and procalcitonin ?Continue Tylenol as  needed ?Continue Mucinex, incentive spirometry, flutter valve  ?Continue IV Levaquin for now until further symptomatic improvement and anticipate discharge in the next 24 hours ?  ?  ?Leukocytosis ?Currently improving, monitor CBC ?  ?COPD (chronic obstructive pulmonary disease) ([place]) ?Continue Breztri Aerosphere, Mucinex, prednisone ?  ?Glaucoma ?Continue latanoprost ?  ?Hypoalbuminemia due to protein-calorie malnutrition ([place]) ?Albumin 3.4, protein supplement will be provided ?  ?Hypokalemia ?Currently resolved ?  ?  ?Mixed hyperlipidemia ?Continue Lipitor ?  ?GERD (gastroesophageal reflux disease) ?Continue Protonix ?  ?Essential hypertension ?Controlled; HCTZ will be held at this time ?  ?Smoker ?Patient was counseled on tobacco abuse cessation ?  ? ? ? ?DVT prophylaxis: Lovenox ?Code Status: Full ?Family Communication: None at bedside ?Disposition Plan:  ?Status is: Inpatient ?Remains inpatient appropriate because: Need for IV medications. ? ? ?Nutritional Assessment: ? ?The patient?s BMI is: Body mass index is 21.79 kg/m?Marland Kitchen. ? ?Seen by dietician.  I agree with the assessment and plan as outlined below: ? ?Nutrition Status: ?  ?  ?  ?Consultants:  ?None ? ?Procedures:  ?None ? ?Antimicrobials:  ?Anti-infectives (From admission, onward)  ? ? Start     Dose/Rate Route Frequency Ordered Stop  ? 01/08/22 0000  levofloxacin (LEVAQUIN) IVPB 750 mg       ? 750 mg ?100 mL/hr over 90 Minutes Intravenous Every 24 hours 01/07/22 0401 01/12/22 2359  ? 01/07/22 0200  levofloxacin (LEVAQUIN) IVPB 750 mg       ? 750 mg ?100 mL/hr over 90 Minutes Intravenous  Once 01/07/22 0158 01/07/22 0401  ? ?  ? ?Subjective: ?  Patient seen and evaluated today with poor appetite with some mild nausea and ongoing weakness.  Uncertain if he can tolerate oral antibiotics at this time and lives alone. ? ?Objective: ?Vitals:  ? 01/07/22 1434 01/07/22 2029 01/08/22 0445 01/08/22 0719  ?BP: 115/71 121/75 129/89   ?Pulse: 90 80 79   ?Resp: 19 18     ?Temp: 98.8 ?F (37.1 ?C) 97.6 ?F (36.4 ?C)    ?TempSrc: Oral     ?SpO2: 95% 99% 98% 98%  ?Weight:      ?Height:      ? ? ?Intake/Output Summary (Last 24 hours) at 01/08/2022 0935 ?Last data filed at 01/08/2022 0500 ?Gross per 24 hour  ?Intake 900.05 ml  ?Output 151 ml  ?Net 749.05 ml  ? ?Filed Weights  ? 01/07/22 0106  ?Weight: 68.9 kg  ? ? ?Examination: ? ?General exam: Appears calm and comfortable  ?Respiratory system: Clear to auscultation. Respiratory effort normal. ?Cardiovascular system: S1 & S2 heard, RRR.  ?Gastrointestinal system: Abdomen is soft ?Central nervous system: Alert and awake ?Extremities: No edema ?Skin: No significant lesions noted ?Psychiatry: Flat affect. ? ? ? ?Data Reviewed: I have personally reviewed following labs and imaging studies ? ?CBC: ?Recent Labs  ?Lab 01/07/22 ?0211 01/07/22 ?8182 01/08/22 ?9937  ?WBC 16.5* 16.2* 14.9*  ?NEUTROABS 12.3*  --   --   ?HGB 15.3 12.8* 11.8*  ?HCT 48.4 41.6 38.9*  ?MCV 100.8* 99.0 99.7  ?PLT 262 242 210  ? ?Basic Metabolic Panel: ?Recent Labs  ?Lab 01/07/22 ?0211 01/07/22 ?1696 01/08/22 ?7893  ?NA 139 138 135  ?K 3.3* 3.2* 3.8  ?CL 93* 95* 95*  ?CO2 34* 34* 31  ?GLUCOSE 86 85 122*  ?BUN 20 20 20   ?CREATININE 0.97 0.93 0.83  ?CALCIUM 9.4 8.8* 8.6*  ?MG  --  2.0 2.2  ?PHOS  --  1.9*  --   ? ?GFR: ?Estimated Creatinine Clearance: 94.5 mL/min (by C-G formula based on SCr of 0.83 mg/dL). ?Liver Function Tests: ?Recent Labs  ?Lab 01/07/22 ?0211 01/07/22 ?8101  ?AST 28 21  ?ALT 28 22  ?ALKPHOS 68 53  ?BILITOT 0.9 0.7  ?PROT 7.6 6.2*  ?ALBUMIN 3.4* 2.7*  ? ?No results for input(s): LIPASE, AMYLASE in the last 168 hours. ?No results for input(s): AMMONIA in the last 168 hours. ?Coagulation Profile: ?Recent Labs  ?Lab 01/07/22 ?0211  ?INR 1.0  ? ?Cardiac Enzymes: ?No results for input(s): CKTOTAL, CKMB, CKMBINDEX, TROPONINI in the last 168 hours. ?BNP (last 3 results) ?No results for input(s): PROBNP in the last 8760 hours. ?HbA1C: ?No results for input(s):  HGBA1C in the last 72 hours. ?CBG: ?No results for input(s): GLUCAP in the last 168 hours. ?Lipid Profile: ?No results for input(s): CHOL, HDL, LDLCALC, TRIG, CHOLHDL, LDLDIRECT in the last 72 hours. ?Thyroid Function Tests: ?No results for input(s): TSH, T4TOTAL, FREET4, T3FREE, THYROIDAB in the last 72 hours. ?Anemia Panel: ?No results for input(s): VITAMINB12, FOLATE, FERRITIN, TIBC, IRON, RETICCTPCT in the last 72 hours. ?Sepsis Labs: ?Recent Labs  ?Lab 01/07/22 ?0211  ?LATICACIDVEN 1.5  ? ? ?Recent Results (from the past 240 hour(s))  ?Blood Culture (routine x 2)     Status: None (Preliminary result)  ? Collection Time: 01/07/22  2:13 AM  ? Specimen: Left Antecubital; Blood  ?Result Value Ref Range Status  ? Specimen Description LEFT ANTECUBITAL  Final  ? Special Requests   Final  ?  BOTTLES DRAWN AEROBIC AND ANAEROBIC Blood Culture adequate volume  ? Culture  Final  ?  NO GROWTH < 12 HOURS ?Performed at [facility], [address] [place], [place] [address] ?  ? Report Status PENDING  Incomplete  ?Resp Panel by RT-PCR (Flu A&B, Covid) Nasopharyngeal Swab     Status: None  ? Collection Time: 01/07/22  2:15 AM  ? Specimen: Nasopharyngeal Swab; Nasopharyngeal(NP) swabs in vial transport medium  ?Result Value Ref Range Status  ? SARS Coronavirus 2 by RT PCR NEGATIVE NEGATIVE Final  ?  Comment: (NOTE) ?SARS-CoV-2 target nucleic acids are NOT DETECTED. ? ?The SARS-CoV-2 RNA is generally detectable in upper respiratory ?specimens during the acute phase of infection. The lowest ?concentration of SARS-CoV-2 viral copies this assay can detect is ?138 copies/mL. A negative result does not preclude SARS-Cov-2 ?infection and should not be used as the sole basis for treatment or ?other patient management decisions. A negative result may occur with  ?improper specimen collection/handling, submission of specimen other ?than nasopharyngeal swab, presence of viral mutation(s) within the ?areas targeted by this assay, and  inadequate number of viral ?copies(<138 copies/mL). A negative result must be combined with ?clinical observations, patient history, and epidemiological ?information. The expected result is Negative. ? ?Fact Sheet for [place]

## 2022-01-08 NOTE — Hospital Course (Signed)
Per HPI: ?Tony Sullivan is a 61 y.o. male with medical history significant of   COPD on 4 LPM of home oxygen, GERD, hypertension, anxiety, osteoarthritis, tobacco and cocaine use and history of cervical fusion who presents to the emergency department due to worsening shortness of breath which has been going on for over a week.  Patient was seen in the ED on 3/2, was provided with breathing treatment and Solu-Medrol and was discharged home with doxycycline and Norco.  He complained of increasing work of breathing despite using home breathing treatments and also complained of an aching pain in neck and back today, so he activated EMS who noted him to have tachycardia and low-grade fever. ?  ?01/07/22: Patient was admitted with community-acquired pneumonia and started on IV Levaquin.  He continues to have some mild nausea as well as ongoing cough.  Plan will be to continue IV Levaquin at this time and monitor for symptomatic improvement prior to considering discharge. Repeat AM labs. Wears 3-4 L [place] at home. ? ?01/08/22: Patient has some ongoing weakness and poor appetite.  Plan to continue current treatments. ?

## 2022-01-09 DIAGNOSIS — J189 Pneumonia, unspecified organism: Secondary | ICD-10-CM | POA: Diagnosis not present

## 2022-01-09 LAB — CBC
HCT: 35.3 % — ABNORMAL LOW (ref 39.0–52.0)
Hemoglobin: 11.4 g/dL — ABNORMAL LOW (ref 13.0–17.0)
MCH: 32.2 pg (ref 26.0–34.0)
MCHC: 32.3 g/dL (ref 30.0–36.0)
MCV: 99.7 fL (ref 80.0–100.0)
Platelets: 256 10*3/uL (ref 150–400)
RBC: 3.54 MIL/uL — ABNORMAL LOW (ref 4.22–5.81)
RDW: 14.4 % (ref 11.5–15.5)
WBC: 14.9 10*3/uL — ABNORMAL HIGH (ref 4.0–10.5)
nRBC: 0 % (ref 0.0–0.2)

## 2022-01-09 MED ORDER — LEVOFLOXACIN 500 MG PO TABS: 500.0000 mg | ORAL_TABLET | Freq: Every day | ORAL | 0 refills | Status: AC

## 2022-01-09 MED ORDER — SIMETHICONE 80 MG PO CHEW: 80.0000 mg | CHEWABLE_TABLET | Freq: Four times a day (QID) | ORAL | 0 refills | Status: DC | PRN

## 2022-01-09 MED ORDER — PREDNISONE 10 MG PO TABS: 20.0000 mg | ORAL_TABLET | Freq: Two times a day (BID) | ORAL | 0 refills | Status: DC

## 2022-01-09 MED ORDER — ALBUTEROL SULFATE HFA 108 (90 BASE) MCG/ACT IN AERS
2.0000 | INHALATION_SPRAY | Freq: Four times a day (QID) | RESPIRATORY_TRACT | 2 refills | Status: DC | PRN
Start: 2022-01-09 — End: 2022-03-15

## 2022-01-09 NOTE — Progress Notes (Signed)
Nsg Discharge Note ? ?Admit Date:  01/07/2022 ?Discharge date: 01/09/2022 ?  ?[name] to be D/C'd Home per MD order.  AVS completed.  Copy for chart, and copy for patient signed, and dated. ?Patient/caregiver able to verbalize understanding. ? ?Discharge Medication: ?Allergies as of 01/09/2022   ? ?   Reactions  ? Penicillins Anaphylaxis  ? Has patient had a PCN reaction causing immediate rash, facial/tongue/throat swelling, SOB or lightheadedness with hypotension: Yes ?Has patient had a PCN reaction causing severe rash involving mucus membranes or skin necrosis: No ?Has patient had a PCN reaction that required hospitalization No ?Has patient had a PCN reaction occurring within the last 10 years: No ?If all of the above answers are "NO", then may proceed with Cephalosporin use.  ? Levocetirizine Other (See Comments)  ? Muscle cramps  ? ?  ? ?  ?Medication List  ?  ? ?STOP taking these medications   ? ?azithromycin 250 MG tablet ?Commonly known as: Zithromax ?  ?doxycycline 100 MG capsule ?Commonly known as: VIBRAMYCIN ?  ? ?  ? ?TAKE these medications   ? ?acetaminophen 325 MG tablet ?Commonly known as: TYLENOL ?Take 2 tablets (650 mg total) by mouth every 6 (six) hours as needed for mild pain (or Fever >/= 101). ?  ?albuterol (2.5 MG/3ML) 0.083% nebulizer solution ?Commonly known as: PROVENTIL ?Take 3 mLs (2.5 mg total) by nebulization every 4 (four) hours as needed for wheezing or shortness of breath. ?  ?albuterol 108 (90 Base) MCG/ACT inhaler ?Commonly known as: VENTOLIN HFA ?Inhale 2 puffs into the lungs every 6 (six) hours as needed for wheezing or shortness of breath. ?  ?atorvastatin 40 MG tablet ?Commonly known as: LIPITOR ?TAKE ONE TABLET BY MOUTH DAILY ?  ?benztropine 0.5 MG tablet ?Commonly known as: COGENTIN ?Take 0.5 mg by mouth at bedtime. ?  ?Breztri Aerosphere 160-9-4.8 MCG/ACT Aero ?Generic drug: Budeson-Glycopyrrol-Formoterol ?Inhale 2 puffs into the lungs in the morning and at bedtime. ?   ?cetirizine 10 MG tablet ?Commonly known as: ZYRTEC ?Take 10 mg by mouth daily. ?  ?dextromethorphan-guaiFENesin 30-600 MG 12hr tablet ?Commonly known as: [place] DM ?Take 1 tablet by mouth 2 (two) times daily. ?  ?diclofenac Sodium 1 % Gel ?Commonly known as: VOLTAREN ?Apply 2 g topically 4 (four) times daily. ?  ?dicyclomine 20 MG tablet ?Commonly known as: BENTYL ?Take 1 tablet (20 mg total) by mouth 2 (two) times daily as needed for spasms. ?  ?dorzolamide 2 % ophthalmic solution ?Commonly known as: TRUSOPT ?Place 1 drop into the left eye 2 (two) times daily. ?  ?hydrochlorothiazide 25 MG tablet ?Commonly known as: HYDRODIURIL ?Take 0.5 tablets (12.5 mg total) by mouth daily. ?  ?HYDROcodone-acetaminophen 5-325 MG tablet ?Commonly known as: NORCO/VICODIN ?Take 1 tablet by mouth every 6 (six) hours as needed. ?  ?hydrOXYzine 25 MG tablet ?Commonly known as: ATARAX ?Take 1 tablet (25 mg total) by mouth 3 (three) times daily as needed for anxiety or nausea. ?  ?latanoprost 0.005 % ophthalmic solution ?Commonly known as: XALATAN ?Place 1 drop into both eyes at bedtime. ?  ?levofloxacin 500 MG tablet ?Commonly known as: Levaquin ?Take 1 tablet (500 mg total) by mouth daily for 3 days. ?  ?loxapine 10 MG capsule ?Commonly known as: LOXITANE ?Take 10 mg by mouth at bedtime. ?  ?megestrol 40 MG tablet ?Commonly known as: MEGACE ?Take 40 mg by mouth daily. ?  ?mirtazapine 30 MG tablet ?Commonly known as: REMERON ?Take 1 tablet (30  mg total) by mouth at bedtime. ?  ?multivitamin with minerals Tabs tablet ?Take 1 tablet by mouth daily. ?  ?OXYGEN ?Inhale 3-4 L into the lungs as directed. 3-4 liters with sleep and exertion as needed for low oxygen ?  ?pantoprazole 40 MG tablet ?Commonly known as: PROTONIX ?Take 1 tablet (40 mg total) by mouth daily. ?  ?polyethylene glycol 17 g packet ?Commonly known as: MIRALAX / GLYCOLAX ?TAKE 17 GRAMS BY MOUTH DAILY ?What changed: See the new instructions. ?  ?predniSONE 10 MG  tablet ?Commonly known as: DELTASONE ?Take 2 tablets (20 mg total) by mouth 2 (two) times daily with a meal. ?  ?risperiDONE 2 MG tablet ?Commonly known as: RISPERDAL ?Take 1 tablet (2 mg total) by mouth at bedtime. ?  ?senna-docusate 8.6-50 MG tablet ?Commonly known as: Senokot-S ?Take 2 tablets by mouth at bedtime. ?  ?simethicone 40 MG/0.6ML drops ?Commonly known as: MYLICON ?Take 0.6 mLs (40 mg total) by mouth 4 (four) times daily as needed for flatulence. ?What changed: Another medication with the same name was added. Make sure you understand how and when to take each. ?  ?simethicone 80 MG chewable tablet ?Commonly known as: MYLICON ?Chew 1 tablet (80 mg total) by mouth 4 (four) times daily as needed for flatulence. ?What changed: You were already taking a medication with the same name, and this prescription was added. Make sure you understand how and when to take each. ?  ? ?  ? ?  ?  ? ? ?  ?Durable Medical Equipment  ?(From admission, onward)  ?  ? ? ?  ? ?  Start     Ordered  ? 01/09/22 0000  For home use only DME Hospital bed       ?Question Answer Comment  ?Length of Need Lifetime   ?Patient has (list medical condition): COPD   ?The above medical condition requires: Patient requires the ability to reposition frequently   ?Head must be elevated greater than: 30 degrees   ?Bed type Semi-electric   ?  ? 01/09/22 0918  ? ?  ?  ? ?  ? ? ?Discharge Assessment: ?Vitals:  ? 01/09/22 0843 01/09/22 1215  ?BP:  (!) 125/95  ?Pulse:  75  ?Resp:  18  ?Temp:  97.6 ?F (36.4 ?C)  ?SpO2: 97% 97%  ? Skin clean, dry and intact without evidence of skin break down, no evidence of skin tears noted. ?IV catheter discontinued intact. Site without signs and symptoms of complications - no redness or edema noted at insertion site, patient denies c/o pain - only slight tenderness at site.  Dressing with slight pressure applied. ? ?D/c Instructions-Education: ?Discharge instructions given to patient/family with verbalized  understanding. ?D/c education completed with patient/family including follow up instructions, medication list, d/c activities limitations if indicated, with other d/c instructions as indicated by MD - patient able to verbalize understanding, all questions fully answered. ?Patient instructed to return to ED, call 911, or call MD for any changes in condition.  ?Patient escorted via [place], and D/C home via private auto. ? ?Radene Gunning King,LPN ?5/37/4827 0:78 PM  ?

## 2022-01-09 NOTE — TOC Initial Note (Signed)
Transition of Care (TOC) - Initial/Assessment Note  ? ? ?Patient Details  ?Name: Tony Sullivan ?MRN: [id number] ?Date of Birth: 08-24-1961 ? ?Transition of Care (TOC) CM/SW Contact:    ?Gerard Cantara D, LCSW ?Phone Number: ?01/09/2022, 10:57 AM ? ?Clinical Narrative:                 ?Patient from home. Admitted for CAP. Has wc and home oxygen. Needs assistance with affording a rescue inhaler. Inhaler is $4. Probed patient about issues with affordability of medication. Patient reports that he get a monthly income. Patient then stated if we were not going to help him don't worry about it.  ?Discussed patient's need for a hospital and DME suppliers. Hospital bed ordered via [employer].   ? ?Expected Discharge Plan: Home/Self Care ?Barriers to Discharge: No Barriers Identified ? ? ?Patient Goals and CMS Choice ?Patient states their goals for this hospitalization and ongoing recovery are:: return home ?  ?  ? ?Expected Discharge Plan and Services ?Expected Discharge Plan: Home/Self Care ?  ?  ?  ?Living arrangements for the past 2 months: [place] ?Expected Discharge Date: 01/09/22               ?DME Arranged: Hospital bed ?DME Agency: [employer] ?Date DME Agency Contacted: 01/09/22 ?Time DME Agency Contacted: [phone] ?Representative spoke with at DME Agency: Caryl Pina ?  ?  ?  ?  ?  ? ?Prior Living Arrangements/Services ?Living arrangements for the past 2 months: [place] ?  ?  ?       ?  ?  ?Current home services: DME (wc, home oxygen) ?  ? ?Activities of Daily Living ?Home Assistive Devices/Equipment: Oxygen, Wheelchair, Walker (specify type) ?ADL Screening (condition at time of admission) ?Patient's cognitive ability adequate to safely complete daily activities?: Yes ?Is the patient deaf or have difficulty hearing?: No ?Does the patient have difficulty seeing, even when wearing glasses/contacts?: No ?Does the patient have difficulty concentrating, remembering, or making decisions?: No ?Patient able to  express need for assistance with ADLs?: Yes ?Does the patient have difficulty dressing or bathing?: Yes ?Independently performs ADLs?: No ?Communication: Independent ?Dressing (OT): Needs assistance ?Is this a change from baseline?: Change from baseline, expected to last >3 days ?Grooming: Independent ?Feeding: Independent ?Bathing: Needs assistance ?Is this a change from baseline?: Change from baseline, expected to last >3 days ?Toileting: Needs assistance ?Is this a change from baseline?: Change from baseline, expected to last >3days ?In/Out Bed: Dependent ?Is this a change from baseline?: Change from baseline, expected to last >3 days ?Walks in Home: Independent with device (comment) ?Does the patient have difficulty walking or climbing stairs?: Yes ?Weakness of Legs: None ?Weakness of Arms/Hands: None ? ?Permission Sought/Granted ?  ?  ?   ?   ?   ?   ? ?Emotional Assessment ?  ?  ?  ?  ?  ?  ? ?Admission diagnosis:  CAP (community acquired pneumonia) [J18.9] ?Chronic respiratory failure, unspecified whether with hypoxia or hypercapnia ([place]) [J96.10] ?Community acquired pneumonia of left upper lobe of lung [J18.9] ?Patient Active Problem List  ? Diagnosis Date Noted  ? CAP (community acquired pneumonia) 01/07/2022  ? Prolonged QT interval 01/07/2022  ? Glaucoma 01/07/2022  ? Elevated troponin 10/27/2021  ? Hyperglycemia 10/27/2021  ? Elevated MCV 10/27/2021  ? Respiratory failure with hypoxia and hypercapnia ([place]) 10/27/2021  ? Acute exacerbation of chronic obstructive pulmonary disease (COPD) ([place]) 10/02/2021  ? Hypophosphatemia 10/02/2021  ? Hypoalbuminemia  due to protein-calorie malnutrition ([place]) 10/02/2021  ? Physical deconditioning 07/26/2021  ? Numbness and tingling in left arm 05/06/2021  ? Leukocytosis 05/05/2021  ? Severe headache 05/05/2021  ? Mixed hyperlipidemia 05/24/2020  ? Hypokalemia 05/24/2020  ? Chest pain 05/23/2020  ? Bipolar disorder ([place]) 05/23/2020  ? HNP (herniated nucleus pulposus),  cervical 09/06/2019  ? Abdominal pain   ? Poor dentition 11/04/2017  ? Acute on chronic respiratory failure with hypoxia and hypercapnia ([place]) 08/29/2017  ? ETOH abuse 03/30/2017  ? Cigarette smoker 03/30/2017  ? Acute respiratory failure with hypoxia and hypercapnia ([place]) 03/29/2017  ? Special screening for malignant neoplasms, colon   ? Benign neoplasm of ascending colon   ? Benign neoplasm of descending colon   ? Hoarseness 08/22/2016  ? GERD (gastroesophageal reflux disease) 08/22/2016  ? Atypical chest pain 12/05/2015  ? Essential hypertension 06/15/2014  ? Pectoralis muscle strain 01/11/2014  ? COPD with acute exacerbation ([place]) 01/10/2014  ? COPD (chronic obstructive pulmonary disease) ([place]) 01/08/2014  ? Smoker 01/08/2014  ? Cough 01/08/2014  ? Chronic respiratory failure with hypoxia ([place]) 01/08/2014  ? ?PCP:  [name], FNP ?Pharmacy:   ?[facility] [address] ?[address] ?[place] [address] ?Phone: [phone] Fax: [phone] ? ?[id number] - [place], [address] ?[address] ?[place] [address] ?Phone: [phone] Fax: [phone] ? ? ? ? ?Social Determinants of Health (SDOH) Interventions ?  ? ?Readmission Risk Interventions ?Readmission Risk Prevention Plan 09/08/2019  ?Transportation Screening Complete  ?PCP or Specialist Appt within 3-5 Days Complete  ?[place] or Home Care Consult Complete  ?Social Work Consult for [place] Planning/Counseling Complete  ?Palliative Care Screening Not Applicable  ?Medication Review Press photographer) Complete  ?Some recent data might be hidden  ? ? ? ?

## 2022-01-09 NOTE — Discharge Summary (Signed)
Physician Discharge Summary  ?[name] SJG:[id number] DOB: [birth year] DOA: 01/07/2022 ? ?PCP: [name], FNP ? ?Admit date: 01/07/2022 ? ?Discharge date: 01/09/2022 ? ?Admitted From:Home ? ?Disposition:  Home ? ?Recommendations for Outpatient Follow-up:  ?Follow up with PCP in 1-2 weeks ?Follow-up with pulmonology outpatient with referral sent ?Refill on inhaler provided ?Continue Levaquin for 3 more days to finish course of treatment for pneumonia ?Continue other home medications as prior ? ?Home Health: None ? ?Equipment/Devices: Has home oxygen 4 L, hospital bed ordered ? ?Discharge Condition:Stable ? ?CODE STATUS: Full ? ?Diet recommendation: Heart Healthy ? ?Brief/Interim Summary: ?Per HPI: ?Tony Sullivan is a 61 y.o. male with medical history significant of   COPD on 4 LPM of home oxygen, GERD, hypertension, anxiety, osteoarthritis, tobacco and cocaine use and history of cervical fusion who presents to the emergency department due to worsening shortness of breath which has been going on for over a week.  Patient was seen in the ED on 3/2, was provided with breathing treatment and Solu-Medrol and was discharged home with doxycycline and Norco.  He complained of increasing work of breathing despite using home breathing treatments and also complained of an aching pain in neck and back today, so he activated EMS who noted him to have tachycardia and low-grade fever. ?  ?01/07/22: Patient was admitted with community-acquired pneumonia and started on IV Levaquin.  He continues to have some mild nausea as well as ongoing cough.  Plan will be to continue IV Levaquin at this time and monitor for symptomatic improvement prior to considering discharge. Repeat AM labs. Wears 3-4 L [employer] at home. ?  ?01/08/22: Patient has some ongoing weakness and poor appetite.  Plan to continue current treatments.  ? ?01/09/2022: Patient is feeling better today and closer to his baseline level of functioning.  He is in stable  condition for discharge to continue on oral antibiotics for 3 more days to complete total 5-day course of treatment.  He is needing some assistance financially to afford his medications and this will be worked on by [employer].  He will need to follow-up with pulmonology as well as PCP in the outpatient setting. ? ?Discharge Diagnoses:  ?Principal Problem: ?  CAP (community acquired pneumonia) ?Active Problems: ?  COPD (chronic obstructive pulmonary disease) ([place]) ?  Leukocytosis ?  Prolonged QT interval ?  Smoker ?  Essential hypertension ?  GERD (gastroesophageal reflux disease) ?  Mixed hyperlipidemia ?  Hypokalemia ?  Hypoalbuminemia due to protein-calorie malnutrition ([place]) ?  Glaucoma ? ?Principal discharge diagnosis: Community-acquired pneumonia in the setting of COPD with chronic hypoxemia. ? ?Discharge Instructions ? ?Discharge Instructions   ? ? Ambulatory referral to Pulmonology   Complete by: As directed ?  ? Reason for referral: Asthma/COPD  ? Diet - low sodium heart healthy   Complete by: As directed ?  ? For home use only DME Hospital bed   Complete by: As directed ?  ? Length of Need: Lifetime  ? Patient has (list medical condition): COPD  ? The above medical condition requires: Patient requires the ability to reposition frequently  ? Head must be elevated greater than: 30 degrees  ? Bed type: Semi-electric  ? Increase activity slowly   Complete by: As directed ?  ? ?  ? ?Allergies as of 01/09/2022   ? ?   Reactions  ? Penicillins Anaphylaxis  ? Has patient had a PCN reaction causing immediate rash, facial/tongue/throat swelling, SOB or lightheadedness with hypotension:  Yes ?Has patient had a PCN reaction causing severe rash involving mucus membranes or skin necrosis: No ?Has patient had a PCN reaction that required hospitalization No ?Has patient had a PCN reaction occurring within the last 10 years: No ?If all of the above answers are "NO", then may proceed with Cephalosporin use.  ? Levocetirizine Other  (See Comments)  ? Muscle cramps  ? ?  ? ?  ?Medication List  ?  ? ?STOP taking these medications   ? ?azithromycin 250 MG tablet ?Commonly known as: Zithromax ?  ?doxycycline 100 MG capsule ?Commonly known as: VIBRAMYCIN ?  ? ?  ? ?TAKE these medications   ? ?acetaminophen 325 MG tablet ?Commonly known as: TYLENOL ?Take 2 tablets (650 mg total) by mouth every 6 (six) hours as needed for mild pain (or Fever >/= 101). ?  ?albuterol (2.5 MG/3ML) 0.083% nebulizer solution ?Commonly known as: PROVENTIL ?Take 3 mLs (2.5 mg total) by nebulization every 4 (four) hours as needed for wheezing or shortness of breath. ?  ?albuterol 108 (90 Base) MCG/ACT inhaler ?Commonly known as: VENTOLIN HFA ?Inhale 2 puffs into the lungs every 6 (six) hours as needed for wheezing or shortness of breath. ?  ?atorvastatin 40 MG tablet ?Commonly known as: LIPITOR ?TAKE ONE TABLET BY MOUTH DAILY ?  ?benztropine 0.5 MG tablet ?Commonly known as: COGENTIN ?Take 0.5 mg by mouth at bedtime. ?  ?Breztri Aerosphere 160-9-4.8 MCG/ACT Aero ?Generic drug: Budeson-Glycopyrrol-Formoterol ?Inhale 2 puffs into the lungs in the morning and at bedtime. ?  ?cetirizine 10 MG tablet ?Commonly known as: ZYRTEC ?Take 10 mg by mouth daily. ?  ?dextromethorphan-guaiFENesin 30-600 MG 12hr tablet ?Commonly known as: [place] DM ?Take 1 tablet by mouth 2 (two) times daily. ?  ?diclofenac Sodium 1 % Gel ?Commonly known as: VOLTAREN ?Apply 2 g topically 4 (four) times daily. ?  ?dicyclomine 20 MG tablet ?Commonly known as: BENTYL ?Take 1 tablet (20 mg total) by mouth 2 (two) times daily as needed for spasms. ?  ?dorzolamide 2 % ophthalmic solution ?Commonly known as: TRUSOPT ?Place 1 drop into the left eye 2 (two) times daily. ?  ?hydrochlorothiazide 25 MG tablet ?Commonly known as: HYDRODIURIL ?Take 0.5 tablets (12.5 mg total) by mouth daily. ?  ?HYDROcodone-acetaminophen 5-325 MG tablet ?Commonly known as: NORCO/VICODIN ?Take 1 tablet by mouth every 6 (six) hours as  needed. ?  ?hydrOXYzine 25 MG tablet ?Commonly known as: ATARAX ?Take 1 tablet (25 mg total) by mouth 3 (three) times daily as needed for anxiety or nausea. ?  ?latanoprost 0.005 % ophthalmic solution ?Commonly known as: XALATAN ?Place 1 drop into both eyes at bedtime. ?  ?levofloxacin 500 MG tablet ?Commonly known as: Levaquin ?Take 1 tablet (500 mg total) by mouth daily for 3 days. ?  ?loxapine 10 MG capsule ?Commonly known as: LOXITANE ?Take 10 mg by mouth at bedtime. ?  ?megestrol 40 MG tablet ?Commonly known as: MEGACE ?Take 40 mg by mouth daily. ?  ?mirtazapine 30 MG tablet ?Commonly known as: REMERON ?Take 1 tablet (30 mg total) by mouth at bedtime. ?  ?multivitamin with minerals Tabs tablet ?Take 1 tablet by mouth daily. ?  ?OXYGEN ?Inhale 3-4 L into the lungs as directed. 3-4 liters with sleep and exertion as needed for low oxygen ?  ?pantoprazole 40 MG tablet ?Commonly known as: PROTONIX ?Take 1 tablet (40 mg total) by mouth daily. ?  ?polyethylene glycol 17 g packet ?Commonly known as: MIRALAX / GLYCOLAX ?TAKE 17 GRAMS BY  MOUTH DAILY ?What changed: See the new instructions. ?  ?predniSONE 10 MG tablet ?Commonly known as: DELTASONE ?Take 2 tablets (20 mg total) by mouth 2 (two) times daily with a meal. ?  ?risperiDONE 2 MG tablet ?Commonly known as: RISPERDAL ?Take 1 tablet (2 mg total) by mouth at bedtime. ?  ?senna-docusate 8.6-50 MG tablet ?Commonly known as: Senokot-S ?Take 2 tablets by mouth at bedtime. ?  ?simethicone 40 MG/0.6ML drops ?Commonly known as: MYLICON ?Take 0.6 mLs (40 mg total) by mouth 4 (four) times daily as needed for flatulence. ?What changed: Another medication with the same name was added. Make sure you understand how and when to take each. ?  ?simethicone 80 MG chewable tablet ?Commonly known as: MYLICON ?Chew 1 tablet (80 mg total) by mouth 4 (four) times daily as needed for flatulence. ?What changed: You were already taking a medication with the same name, and this prescription  was added. Make sure you understand how and when to take each. ?  ? ?  ? ?  ?  ? ? ?  ?Durable Medical Equipment  ?(From admission, onward)  ?  ? ? ?  ? ?  Start     Ordered  ? 01/09/22 0000  For home use only DM

## 2022-01-09 NOTE — Progress Notes (Signed)
PT Cancellation Note ? ?Patient Details ?Name: Tony Sullivan ?MRN: [id number] ?DOB: 05/08/1961 ? ? ?Cancelled Treatment:    Reason Eval/Treat Not Completed: Patient declined, no reason specified.  Patient declined therapy stating that his home aides will be able to take care of him - nurse notified. ? ? ?10:08 AM, 01/09/22 ?Lonell Grandchild, MPT ?Physical Therapist with [facility] ?[facility] ?[phone] office ?[phone] mobile phone ? ?

## 2022-01-10 ENCOUNTER — Telehealth: Payer: Self-pay

## 2022-01-10 NOTE — Telephone Encounter (Signed)
Called and scheduled patient for next Friday at 2:00 pm. Clarified with patient that he is scheduled for the [facility] office. Nothing further needed.  ?

## 2022-01-10 NOTE — Telephone Encounter (Signed)
Ok to use the Friday pm slots for this pt but he should bring all meds ?

## 2022-01-10 NOTE — Telephone Encounter (Signed)
Patient needs a hosp f/u for hosp admission 3/13-3/15 at [facility] for CAP ?Has an appt on 03/27/22.  ?Next available in [facility] for Dr. [name] is 02/14/2022 ?Dr. [name] would you advise that we double book pt for an appt next week or wait until your next available in [facility] (4/20)? Thanks!  ?

## 2022-01-12 LAB — CULTURE, BLOOD (ROUTINE X 2)
Culture: NO GROWTH
Culture: NO GROWTH
Special Requests: ADEQUATE
Special Requests: ADEQUATE

## 2022-01-13 ENCOUNTER — Emergency Department (HOSPITAL_COMMUNITY)
Admission: EM | Admit: 2022-01-13 | Discharge: 2022-01-13 | Disposition: A | Discharge status: Home / Self Care | Payer: Medicaid Other | Attending: Emergency Medicine | Admitting: Emergency Medicine

## 2022-01-13 ENCOUNTER — Encounter (HOSPITAL_COMMUNITY): Payer: Self-pay | Admitting: Emergency Medicine

## 2022-01-13 ENCOUNTER — Other Ambulatory Visit: Payer: Self-pay

## 2022-01-13 ENCOUNTER — Emergency Department (HOSPITAL_COMMUNITY): Payer: Medicaid Other

## 2022-01-13 DIAGNOSIS — J441 Chronic obstructive pulmonary disease with (acute) exacerbation: Secondary | ICD-10-CM

## 2022-01-13 DIAGNOSIS — Z79899 Other long term (current) drug therapy: Secondary | ICD-10-CM | POA: Diagnosis not present

## 2022-01-13 DIAGNOSIS — F17210 Nicotine dependence, cigarettes, uncomplicated: Secondary | ICD-10-CM | POA: Diagnosis not present

## 2022-01-13 DIAGNOSIS — R0602 Shortness of breath: Secondary | ICD-10-CM | POA: Diagnosis present

## 2022-01-13 LAB — BASIC METABOLIC PANEL
Anion gap: 9 (ref 5–15)
BUN: 22 mg/dL — ABNORMAL HIGH (ref 6–20)
CO2: 30 mmol/L (ref 22–32)
Calcium: 8.4 mg/dL — ABNORMAL LOW (ref 8.9–10.3)
Chloride: 101 mmol/L (ref 98–111)
Creatinine, Ser: 0.95 mg/dL (ref 0.61–1.24)
GFR, Estimated: >60 mL/min (ref >60)
Glucose, Bld: 121 mg/dL — ABNORMAL HIGH (ref 70–99)
Potassium: 4.1 mmol/L (ref 3.5–5.1)
Sodium: 140 mmol/L (ref 135–145)

## 2022-01-13 LAB — CBC WITH DIFFERENTIAL/PLATELET
Abs Immature Granulocytes: 0.57 10*3/uL — ABNORMAL HIGH (ref 0.00–0.07)
Basophils Absolute: 0.1 10*3/uL (ref 0.0–0.1)
Basophils Relative: 1 %
Eosinophils Absolute: 0 10*3/uL (ref 0.0–0.5)
Eosinophils Relative: 0 %
HCT: 39.9 % (ref 39.0–52.0)
Hemoglobin: 12 g/dL — ABNORMAL LOW (ref 13.0–17.0)
Immature Granulocytes: 5 %
Lymphocytes Relative: 9 %
Lymphs Abs: 1.2 10*3/uL (ref 0.7–4.0)
MCH: 30.5 pg (ref 26.0–34.0)
MCHC: 30.1 g/dL (ref 30.0–36.0)
MCV: 101.5 fL — ABNORMAL HIGH (ref 80.0–100.0)
Monocytes Absolute: 0.8 10*3/uL (ref 0.1–1.0)
Monocytes Relative: 6 %
Neutro Abs: 10 10*3/uL — ABNORMAL HIGH (ref 1.7–7.7)
Neutrophils Relative %: 79 %
Platelets: 245 10*3/uL (ref 150–400)
RBC: 3.93 MIL/uL — ABNORMAL LOW (ref 4.22–5.81)
RDW: 15.3 % (ref 11.5–15.5)
WBC: 12.6 10*3/uL — ABNORMAL HIGH (ref 4.0–10.5)
nRBC: 0 % (ref 0.0–0.2)

## 2022-01-13 LAB — BRAIN NATRIURETIC PEPTIDE: B Natriuretic Peptide: 60 pg/mL (ref 0.0–100.0)

## 2022-01-13 LAB — TROPONIN I (HIGH SENSITIVITY): Troponin I (High Sensitivity): 11 ng/L (ref <18)

## 2022-01-13 IMAGING — DX DG CHEST 2V
2 series · 2 of 2 positions shown · non-contrast
Comparison: [DATE]

CLINICAL DATA: Cough, shortness of breath, follow-up pneumonia

EXAM:
CHEST - 2 VIEW

[chest ap]
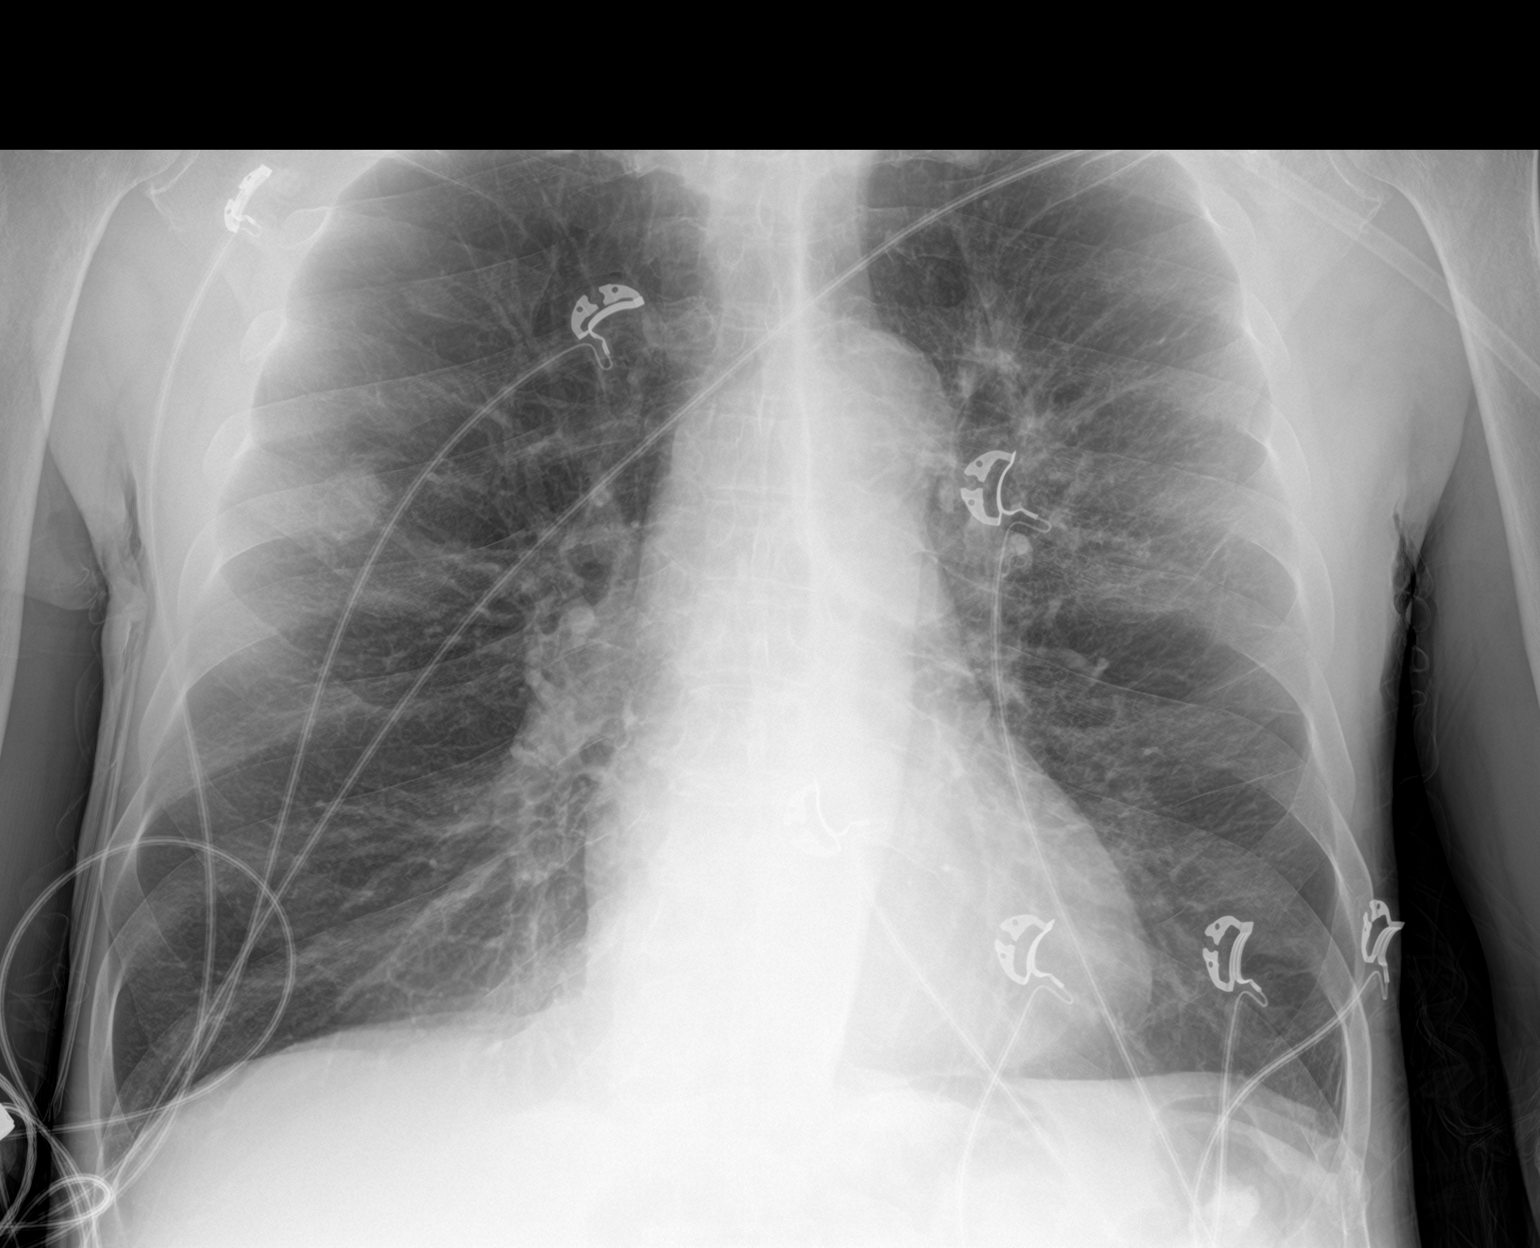

[chest lat]
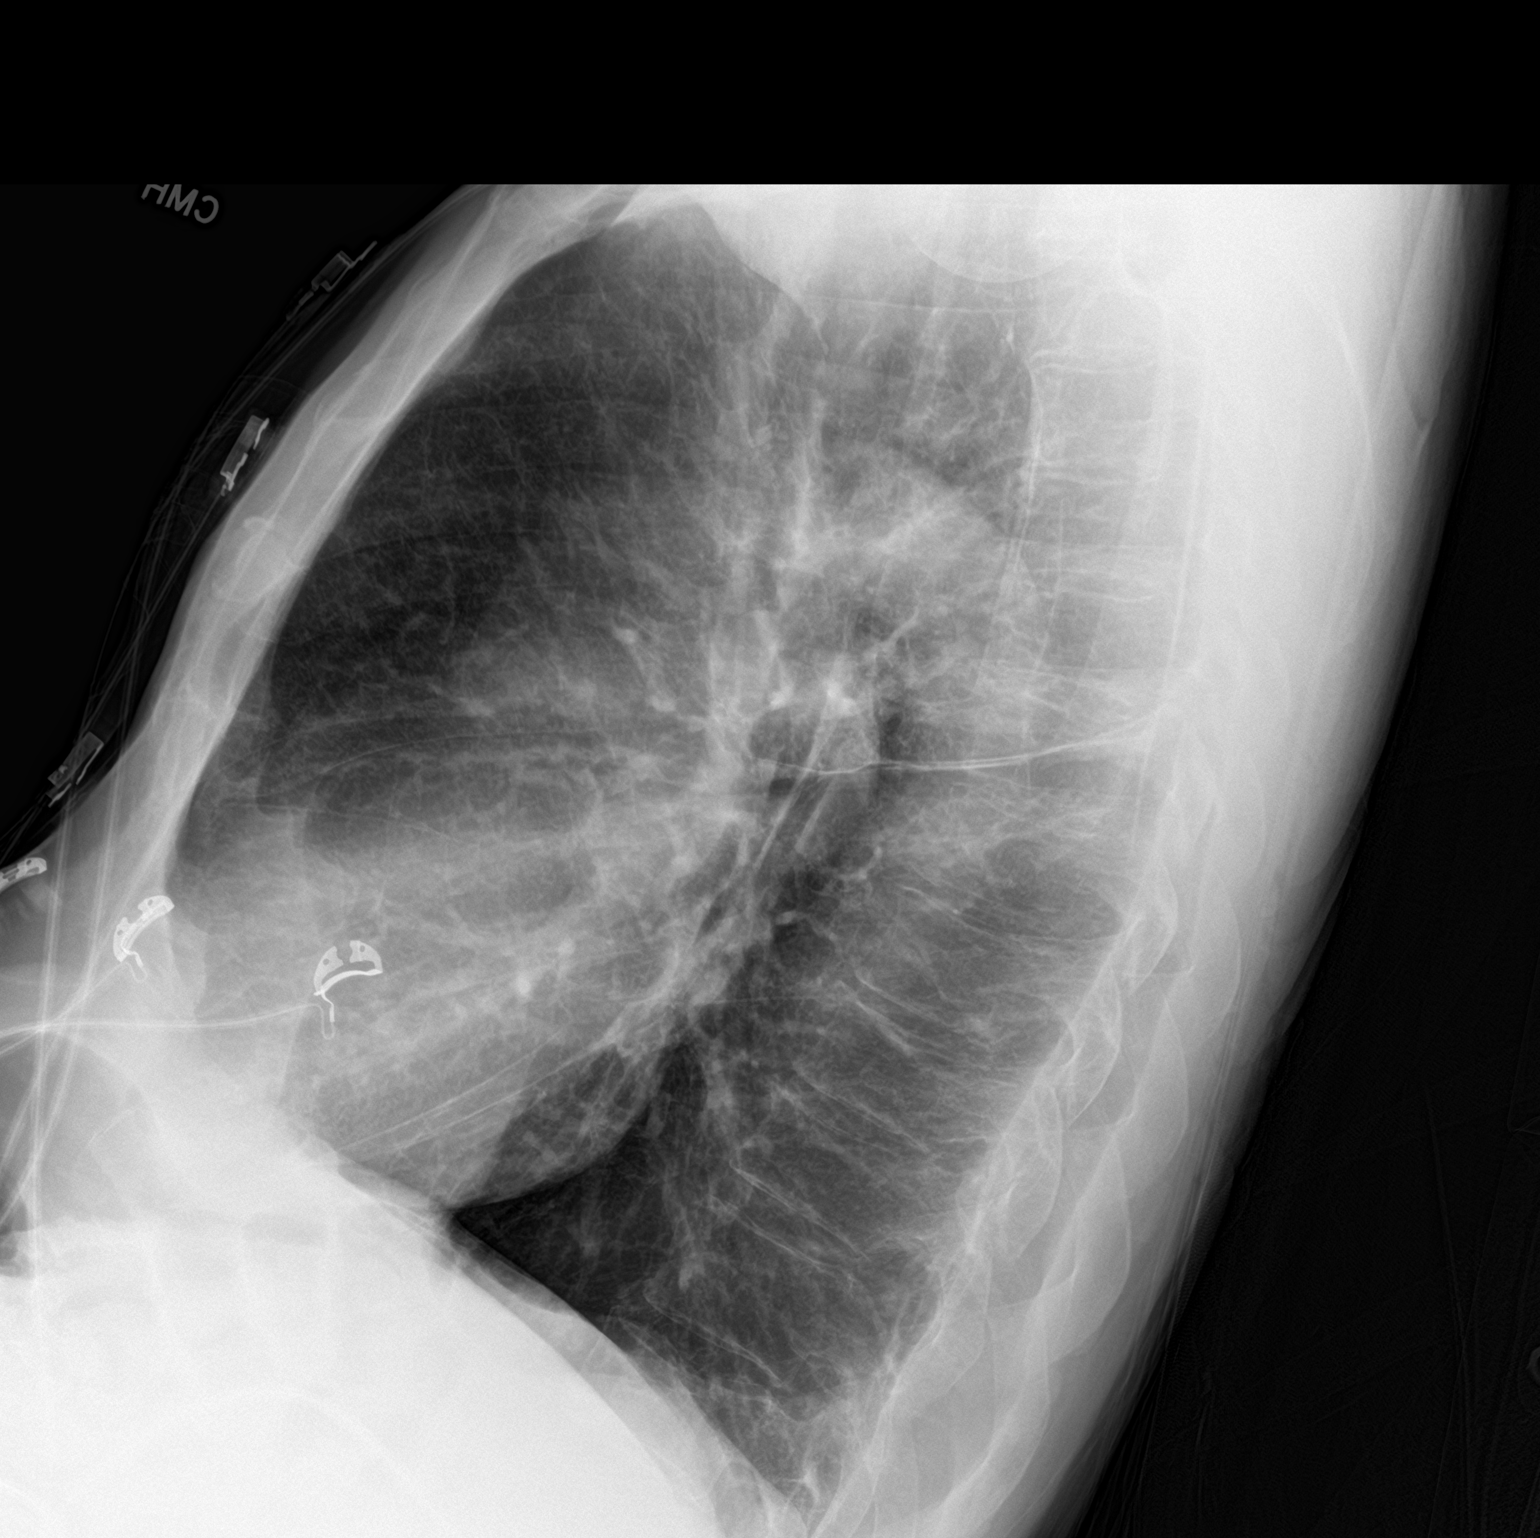

[2 of 2 positions shown; findings below may reference images not displayed]

FINDINGS: Left suprahilar opacity, improved from recent prior.

Nodular opacity along the right mid lung corresponds to a healing
right posterior rib fracture. No pleural effusion or pneumothorax.

The heart is normal in size.

Visualized thoracolumbar spine is within normal limits.
IMPRESSION: Improving left upper lobe pneumonia.

## 2022-01-13 MED ORDER — ALBUTEROL SULFATE HFA 108 (90 BASE) MCG/ACT IN AERS
INHALATION_SPRAY | RESPIRATORY_TRACT | Status: AC
  Filled 2022-01-13: qty 6.7

## 2022-01-13 MED ORDER — IPRATROPIUM-ALBUTEROL 0.5-2.5 (3) MG/3ML IN SOLN
3.0000 mL | Freq: Once | RESPIRATORY_TRACT | Status: AC
  Administered 2022-01-13: 3 mL via RESPIRATORY_TRACT
  Filled 2022-01-13: qty 3

## 2022-01-13 MED ORDER — DM-GUAIFENESIN ER 30-600 MG PO TB12: 1.0000 | ORAL_TABLET | Freq: Two times a day (BID) | ORAL | 0 refills | Status: DC | PRN

## 2022-01-13 NOTE — ED Provider Notes (Signed)
?[facility] ?Provider Note ? ? ?CSN: [id number] ?Arrival date & time: 01/13/22  1857 ? ?  ? ?History ? ?Chief Complaint  ?Patient presents with  ? Shortness of Breath  ? ? ?Tony Sullivan is a 61 y.o. male. ? ? ?Shortness of Breath ? ?This patient is a 61 year old male, he has a known history of severe COPD for which she is on oxygen between 3 and 4 L all the time.  The patient had actually been admitted to the hospital on March 13 and discharged on March 15 and now comes back 4 days later with some ongoing feeling like he cannot breathe.  He states that when he checks his oxygen at home he has essentially normal oxygen but feels like the medicines are not working and he continues to be tight in the chest.  He reports a history of 2-1/2 packs/day tobacco use but is now down to about 1 pack/week ? ?When the patient was seen in the emergency department 6 days ago he had a temperature of 100.6, oxygen was adequate, x-ray showed a left upper lobe infiltrate, he was treated with Levaquin after he had been treated with doxycycline as an outpatient.  He has been on the Levaquin since discharge.  He is not having any increased amounts of coughing or fever he just feels like the medicines not working to relieve the tightness in his chest. ? ?Home Medications ?Prior to Admission medications   ?Medication Sig Start Date End Date Taking? Authorizing Provider  ?dextromethorphan-guaiFENesin (MUCINEX DM) 30-600 MG 12hr tablet Take 1 tablet by mouth 2 (two) times daily as needed for cough. 01/13/22  Yes [name], MD  ?acetaminophen (TYLENOL) 325 MG tablet Take 2 tablets (650 mg total) by mouth every 6 (six) hours as needed for mild pain (or Fever >/= 101). 10/30/21   [name], MD  ?albuterol (PROVENTIL) (2.5 MG/3ML) 0.083% nebulizer solution Take 3 mLs (2.5 mg total) by nebulization every 4 (four) hours as needed for wheezing or shortness of breath. 12/12/21 12/12/22  [name], MD  ?albuterol  (VENTOLIN HFA) 108 (90 Base) MCG/ACT inhaler Inhale 2 puffs into the lungs every 6 (six) hours as needed for wheezing or shortness of breath. 01/09/22   [name], Pratik D, DO  ?atorvastatin (LIPITOR) 40 MG tablet TAKE ONE TABLET BY MOUTH DAILY ?Patient taking differently: Take 40 mg by mouth daily. 07/06/20   [name], MD  ?benztropine (COGENTIN) 0.5 MG tablet Take 0.5 mg by mouth at bedtime.    [provider]  ?Budeson-Glycopyrrol-Formoterol (BREZTRI AEROSPHERE) 160-9-4.8 MCG/ACT AERO Inhale 2 puffs into the lungs in the morning and at bedtime. 12/12/21   [name], MD  ?cetirizine (ZYRTEC) 10 MG tablet Take 10 mg by mouth daily.    [provider]  ?diclofenac Sodium (VOLTAREN) 1 % GEL Apply 2 g topically 4 (four) times daily.    [provider]  ?dicyclomine (BENTYL) 20 MG tablet Take 1 tablet (20 mg total) by mouth 2 (two) times daily as needed for spasms. 12/15/21   [name], PA-C  ?dorzolamide (TRUSOPT) 2 % ophthalmic solution Place 1 drop into the left eye 2 (two) times daily. 12/12/20   [provider]  ?hydrochlorothiazide (HYDRODIURIL) 25 MG tablet Take 0.5 tablets (12.5 mg total) by mouth daily. 10/30/21   [name], MD  ?HYDROcodone-acetaminophen (NORCO/VICODIN) 5-325 MG tablet Take 1 tablet by mouth every 6 (six) hours as needed. 12/27/21   [name], MD  ?  hydrOXYzine (ATARAX) 25 MG tablet Take 1 tablet (25 mg total) by mouth 3 (three) times daily as needed for anxiety or nausea. 10/30/21   [name], MD  ?latanoprost (XALATAN) 0.005 % ophthalmic solution Place 1 drop into both eyes at bedtime. 05/24/19   [provider]  ?loxapine (LOXITANE) 10 MG capsule Take 10 mg by mouth at bedtime.     [provider]  ?megestrol (MEGACE) 40 MG tablet Take 40 mg by mouth daily.    [provider]  ?mirtazapine (REMERON) 30 MG tablet Take 1 tablet (30 mg total) by mouth at bedtime. 10/30/21   [name], MD  ?Multiple  Vitamin (MULTIVITAMIN WITH MINERALS) TABS tablet Take 1 tablet by mouth daily. 10/31/21   [name], MD  ?OXYGEN Inhale 3-4 L into the lungs as directed. 3-4 liters with sleep and exertion as needed for low oxygen    [provider]  ?pantoprazole (PROTONIX) 40 MG tablet Take 1 tablet (40 mg total) by mouth daily. 10/30/21   [name], MD  ?polyethylene glycol (MIRALAX / GLYCOLAX) 17 g packet TAKE 17 GRAMS BY MOUTH DAILY ?Patient taking differently: Take 17 g by mouth daily as needed for mild constipation. 03/09/20   [name], MD  ?predniSONE (DELTASONE) 10 MG tablet Take 2 tablets (20 mg total) by mouth 2 (two) times daily with a meal. 01/09/22 02/08/22  [name], Pratik D, DO  ?risperiDONE (RISPERDAL) 2 MG tablet Take 1 tablet (2 mg total) by mouth at bedtime. 10/30/21   [name], MD  ?senna-docusate (SENOKOT-S) 8.6-50 MG tablet Take 2 tablets by mouth at bedtime. 10/30/21   [name], MD  ?simethicone (MYLICON) 40 YN/8.2NF drops Take 0.6 mLs (40 mg total) by mouth 4 (four) times daily as needed for flatulence. 10/17/21   Orpah Greek, MD  ?simethicone (MYLICON) 80 MG chewable tablet Chew 1 tablet (80 mg total) by mouth 4 (four) times daily as needed for flatulence. 01/09/22   [name], DO  ?   ? ?Allergies    ?Penicillins and Levocetirizine   ? ?Review of Systems   ?Review of Systems  ?Respiratory:  Positive for shortness of breath.   ?All other systems reviewed and are negative. ? ?Physical Exam ?Updated Vital Signs ?BP 133/85   Pulse 77   Temp 98.4 ?F (36.9 ?C) (Oral)   Resp 15   Ht 1.778 m (5\' 10" )   Wt 68.9 kg   SpO2 100%   BMI 21.79 kg/m?  ?Physical Exam ?Vitals and nursing note reviewed.  ?Constitutional:   ?   General: He is not in acute distress. ?   Appearance: He is well-developed.  ?HENT:  ?   Head: Normocephalic and atraumatic.  ?   Mouth/Throat:  ?   Pharynx: No oropharyngeal exudate.  ?Eyes:  ?   General: No scleral icterus.    ?   Right eye: No  discharge.     ?   Left eye: No discharge.  ?   Conjunctiva/sclera: Conjunctivae normal.  ?   Pupils: Pupils are equal, round, and reactive to light.  ?Neck:  ?   Thyroid: No thyromegaly.  ?   Vascular: No JVD.  ?Cardiovascular:  ?   Rate and Rhythm: Normal rate and regular rhythm.  ?   Heart sounds: Normal heart sounds. No murmur heard. ?  No friction rub. No gallop.  ?Pulmonary:  ?   Effort: Pulmonary effort is normal. No respiratory distress.  ?   Breath  sounds: No wheezing or rales.  ?   Comments: The patient is able to speak in full sentences, he has some slight prolonged expiration and decreased breath sounds bilaterally.  No rales ?Abdominal:  ?   General: Bowel sounds are normal. There is no distension.  ?   Palpations: Abdomen is soft. There is no mass.  ?   Tenderness: There is no abdominal tenderness.  ?Musculoskeletal:     ?   General: No tenderness. Normal range of motion.  ?   Cervical back: Normal range of motion and neck supple.  ?   Right lower leg: Edema present.  ?   Left lower leg: Edema present.  ?   Comments: Edema at the ankles bilaterally  ?Lymphadenopathy:  ?   Cervical: No cervical adenopathy.  ?Skin: ?   General: Skin is warm and dry.  ?   Findings: No erythema or rash.  ?Neurological:  ?   Mental Status: He is alert.  ?   Coordination: Coordination normal.  ?Psychiatric:     ?   Behavior: Behavior normal.  ? ? ?ED Results / Procedures / Treatments   ?Labs ?(all labs ordered are listed, but only abnormal results are displayed) ?Labs Reviewed  ?CBC WITH DIFFERENTIAL/PLATELET - Abnormal; Notable for the following components:  ?    Result Value  ? WBC 12.6 (*)   ? RBC 3.93 (*)   ? Hemoglobin 12.0 (*)   ? MCV 101.5 (*)   ? Neutro Abs 10.0 (*)   ? Abs Immature Granulocytes 0.57 (*)   ? All other components within normal limits  ?BASIC METABOLIC PANEL - Abnormal; Notable for the following components:  ? Glucose, Bld 121 (*)   ? BUN 22 (*)   ? Calcium 8.4 (*)   ? All other components within  normal limits  ?BRAIN NATRIURETIC PEPTIDE  ?TROPONIN I (HIGH SENSITIVITY)  ? ? ?EKG ?EKG Interpretation ? ?Date/Time:  Sunday January 13 2022 19:18:27 EDT ?Ventricular Rate:  96 ?PR Interval:  150 ?QRS Duration: 70 ?QT Interv

## 2022-01-13 NOTE — ED Triage Notes (Signed)
Pt reports he was recently d/c from a pneumonia diagnosis, reports he has taken all abx but continues to feel SOB, pt reports headache  ?

## 2022-01-13 NOTE — Discharge Instructions (Addendum)
I would encourage you to see your family doctor within 3 days for recheck.  You will need to continue to take the medicine like the guaifenesin which I prescribed as this will help to loosen up some of the phlegm in your chest.  You need to drink plenty of clear liquids when you are taking this.  Continue your albuterol treatments every 4 hours.  Continue your steroids daily, continue your antibiotics until they are gone. ? ?ER for worsening symptoms ?

## 2022-01-17 ENCOUNTER — Telehealth: Payer: Self-pay | Admitting: Internal Medicine

## 2022-01-17 NOTE — Telephone Encounter (Signed)
Called and spoke with patient. He stated that he was scheduled to be in court today but he was unable to attend due to him having PNA. He has been in the hospital at least 3 times over the past few weeks for PNA and COPD exacerbations. He wishes to have this letter faxed to [phone].  ? ?MW, can you please advise? Thanks!  ?

## 2022-01-17 NOTE — Telephone Encounter (Signed)
Called and spoke with patient. He is aware that MW is ok with the letter for court. Will check with Meghan in [place] to see if she will be able to print to have MW to sign after I load the letter into his chart.   ?

## 2022-01-17 NOTE — Telephone Encounter (Signed)
Letter was signed and faxed over to courthouse. Patient came by office today requesting additional papers form an old AVS to be faxed and wanted the letter to mention he had pneumonia. He agreed to see if they would accept the first letter since it was already faxed over.  ?

## 2022-01-17 NOTE — Telephone Encounter (Signed)
Patient has very severe copd and is oxygen dependent with frequent flares resulting in hospitalization so has not been able to attend court proceedings since   (fill in blank for the first time he was requested) through today nor will he be able to for the foreseeable future until able to return to this office for re-evaluation.  ?

## 2022-01-18 ENCOUNTER — Other Ambulatory Visit: Payer: Self-pay

## 2022-01-18 ENCOUNTER — Ambulatory Visit (INDEPENDENT_AMBULATORY_CARE_PROVIDER_SITE_OTHER): Payer: Medicaid Other | Admitting: Internal Medicine

## 2022-01-18 ENCOUNTER — Encounter: Payer: Self-pay | Admitting: Internal Medicine

## 2022-01-18 DIAGNOSIS — J449 Chronic obstructive pulmonary disease, unspecified: Secondary | ICD-10-CM

## 2022-01-18 DIAGNOSIS — J9611 Chronic respiratory failure with hypoxia: Secondary | ICD-10-CM

## 2022-01-18 NOTE — Progress Notes (Signed)
? ? ?Subjective:  ? ?Patient ID: Tony Sullivan, male    DOB: 10-Jan-1961  MRN: [id number] ? ?  ?Brief patient profile:  ?61 yobm quit smoking 3/23 with breathing difficulties since 2004 dx as copd on advair 250 /50 but getting worse since 2014 and placed on 02 for the first time in March 2015 and referred 01/07/2014 to pulmonary clinic by Dr Kennon Holter with GOLD II copd criteria established 02/14/14  ? ? ?History of Present Illness  ?01/07/2014 1st [facility] office visit/ Corianne Buccellato  ?Chief Complaint  ?Patient presents with  ? Advice Only  ?  Referred for COPD, SOB.  Pt c/o SOB, low 02, prod cough with white mucous.  Pt forgot med list, unsure of what all he is taking  ?still can do a [employer] but uses [facility] parking, assoc with congested coughing esp in am worse in winter. ?rec ?Stop advair   ?Plan A =  Automatic =  symbicort 160 Take 2 puffs first thing in am and then another 2 puffs about 12 hours later. spiriva in am only  ?Plan B = Backup- Proventil use it only if you can't your breath - ok to use up to every 4 hours if needed ?Plan C = Nebulizer up to every 4 hours if needed only if you try plan B first and it doesn't wor ? ?  ? ? ?12/21/2018  f/u ov/Aedan Geimer re:  GOLD III copd/ pred 30 mg daily with floor 10 mg on symb 160/spiriva  ?Chief Complaint  ?Patient presents with  ? Follow-up  ?  Pt c/o sinus pressure off and on for the past wk. He is having some increased SOB this morning.   ? Dyspnea:  Can do shopping at grocery on 2lpm does not check sats  ?Cough: usual cough = rattling  ?Sleeping: bed is flat / pillows up to 30 degrees  ?SABA use: up to q 6 hours on neb  ?02: 2lpm  ?rec ?Plan A = Automatic = symbicort 160 Take 2 puffs first thing in am and then another 2 puffs about 12 hours later/spiriva 2.5  X 3 puffs each am  ?Work on inhaler technique ?Plan B = Backup ?Only use your albuterol inhaler as a rescue medication  ?Plan C = Crisis ?- only use your albuterol nebulizer if you first try Plan B and it fails to  help > ok to use the nebulizer up to every 4 hours but if start needing it regularly call for immediate appointment ?Plan D = Deltasone  ?Take 2 daily until better then 1 daily  ? ? ? ?Admit date: 01/07/2022 ?Discharge date: 01/09/2022 ?  ?  ?Brief/Interim Summary: ?Per HPI: ?Tony Sullivan is a 61 y.o. male with medical history significant of   COPD on 4 LPM of home oxygen, GERD, hypertension, anxiety, osteoarthritis, tobacco and cocaine use and history of cervical fusion who presents to the emergency department due to worsening shortness of breath which has been going on for over a week.  Patient was seen in the ED on 3/2, was provided with breathing treatment and Solu-Medrol and was discharged home with doxycycline and Norco.  He complained of increasing work of breathing despite using home breathing treatments and also complained of an aching pain in neck and back today, so he activated EMS who noted him to have tachycardia and low-grade fever> LUL as dz c/w CAP ?  ?01/07/22: Patient was admitted with community-acquired pneumonia and started on IV Levaquin.  He  continues to have some mild nausea as well as ongoing cough.  Plan will be to continue IV Levaquin at this time and monitor for symptomatic improvement prior to considering discharge. Repeat AM labs. Wears 3-4 L [place] at home. ?  ?01/08/22: Patient has some ongoing weakness and poor appetite.  Plan to continue current treatments.  ?  ?01/09/2022: Patient is feeling better today and closer to his baseline level of functioning.  He is in stable condition for discharge to continue on oral antibiotics for 3 more days to complete total 5-day course of treatment.  He is needing some assistance financially to afford his medications and this will be worked on by [employer].  He will need to follow-up with pulmonology as well as PCP in the outpatient setting. ?  ?Discharge Diagnoses:  ?  CAP (community acquired pneumonia) ?  COPD (chronic obstructive pulmonary disease) ([place]) ?   Leukocytosis ?  Prolonged QT interval ?  Smoker ?  Essential hypertension ?  GERD (gastroesophageal reflux disease) ?  Mixed hyperlipidemia ?  Hypokalemia ?  Hypoalbuminemia due to protein-calorie malnutrition ([place]) ?  Glaucoma ?   ?   ? ?01/18/2022  f/u ov/[place] office/Quadarius Henton re: GOLD 3/ 02 dep maint on breztri and pred 20 mg daily   ?Chief Complaint  ?Patient presents with  ? Hospitalization Follow-up  ?  Hosp from 3/13-3/15 for pneumonia.  ?Patient still feeling like his chest is tight   ?Dyspnea:  10 ft / uses motorized w/c  ?Cough: smoker's rattle esp in am  / last cig Dec 16 2021 / not using flutter valve much  ?Sleeping: ok  at 30 hosp bed  ?SABA use: more hfa  ?02: 3 pulsed out/  3-4 lpm at home  ?Covid status: vax x 4  ? ? ?No obvious day to day or daytime variability or assoc excess/ purulent sputum or mucus plugs or hemoptysis or cp or chest tightness, subjective wheeze or overt sinus or hb symptoms.  ? ?sleeping without nocturnal  exacerbation  of respiratory  c/o's or need for noct saba. Also denies any obvious fluctuation of symptoms with weather or environmental changes or other aggravating or alleviating factors except as outlined above  ? ?No unusual exposure hx or h/o childhood pna/ asthma or knowledge of premature birth. ? ?Current Allergies, Complete Past Medical History, Past Surgical History, Family History, and Social History were reviewed in [employer] record. ? ?ROS  The following are not active complaints unless bolded ?Hoarseness, sore throat, dysphagia, dental problems, itching, sneezing,  nasal congestion or discharge of excess mucus or purulent secretions, ear ache,   fever, chills, sweats, unintended wt loss or wt gain, classically pleuritic or exertional cp,  orthopnea pnd or arm/hand swelling  or leg swelling, presyncope, palpitations, abdominal pain, anorexia, nausea, vomiting, diarrhea  or change in bowel habits or change in bladder habits, change in  stools or change in urine, dysuria, hematuria,  rash, arthralgias, visual complaints, headache, numbness, weakness or ataxia or problems with walking or coordination,  change in mood or  memory. ?      ? ?Current Meds  ?Medication Sig  ? acetaminophen (TYLENOL) 325 MG tablet Take 2 tablets (650 mg total) by mouth every 6 (six) hours as needed for mild pain (or Fever >/= 101).  ? albuterol (PROVENTIL) (2.5 MG/3ML) 0.083% nebulizer solution Take 3 mLs (2.5 mg total) by nebulization every 4 (four) hours as needed for wheezing or shortness of breath.  ? albuterol (VENTOLIN  HFA) 108 (90 Base) MCG/ACT inhaler Inhale 2 puffs into the lungs every 6 (six) hours as needed for wheezing or shortness of breath.  ? atorvastatin (LIPITOR) 40 MG tablet TAKE ONE TABLET BY MOUTH DAILY (Patient taking differently: Take 40 mg by mouth daily.)  ? benztropine (COGENTIN) 0.5 MG tablet Take 0.5 mg by mouth at bedtime.  ? Budeson-Glycopyrrol-Formoterol (BREZTRI AEROSPHERE) 160-9-4.8 MCG/ACT AERO Inhale 2 puffs into the lungs in the morning and at bedtime.  ? cetirizine (ZYRTEC) 10 MG tablet Take 10 mg by mouth daily.  ? dextromethorphan-guaiFENesin (MUCINEX DM) 30-600 MG 12hr tablet Take 1 tablet by mouth 2 (two) times daily as needed for cough.  ? diclofenac Sodium (VOLTAREN) 1 % GEL Apply 2 g topically 4 (four) times daily.  ? dicyclomine (BENTYL) 20 MG tablet Take 1 tablet (20 mg total) by mouth 2 (two) times daily as needed for spasms.  ? dorzolamide (TRUSOPT) 2 % ophthalmic solution Place 1 drop into the left eye 2 (two) times daily.  ? hydrochlorothiazide (HYDRODIURIL) 25 MG tablet Take 0.5 tablets (12.5 mg total) by mouth daily.  ? hydrOXYzine (ATARAX) 25 MG tablet Take 1 tablet (25 mg total) by mouth 3 (three) times daily as needed for anxiety or nausea.  ? latanoprost (XALATAN) 0.005 % ophthalmic solution Place 1 drop into both eyes at bedtime.  ? loxapine (LOXITANE) 10 MG capsule Take 10 mg by mouth at bedtime.   ? megestrol  (MEGACE) 40 MG tablet Take 40 mg by mouth daily.  ? mirtazapine (REMERON) 30 MG tablet Take 1 tablet (30 mg total) by mouth at bedtime.  ? Multiple Vitamin (MULTIVITAMIN WITH MINERALS) TABS tablet Take 1 tablet

## 2022-01-18 NOTE — Patient Instructions (Addendum)
Ok to try albuterol 15 min before an activity (on alternating days)  that you know would usually make you short of breath and see if it makes any difference and if makes none then don't take albuterol after activity unless you can't catch your breath as this means it's the resting that helps, not the albuterol. ? ?Please schedule a follow up visit in 3 months but call sooner if needed  ? ? ?    ?

## 2022-01-18 NOTE — Assessment & Plan Note (Signed)
First started on 27 December 2013 by [facility] clinic ?- 02/14/2014  Walked RA x 3/4  laps @ 185 ft each stopped due to  desat 85% and corrected on 3lpm to x 2 laps  ?- 06/15/2014  Walked 2.5lpm  2 laps  @ 185 ft each stopped due to  Sob adequate sats  ?- 02/20/2015  Walked RA x 3 laps @ 185 ft each stopped due to  Sob/desat p 2 laps, nl pace, eliminated on his 02 at 3lpm  ?- 12/05/2015   Walked RA  2 laps @ 185 ft each stopped due to  desat to 88% and corrected on 2lpm  ?- 03/16/2018   Walked 3lmpm  x one lap @ 185 stopped due to /desat  To 89% at relatively fast pace  ?- 06/10/2018   Walked RA x one lap @ 185 stopped due to  Sob / desats to 86% resolved on repeating walk on 2lpm but still sob p one lap  ?- 07/27/2018  Walked 2lpm continuous x 2 laps @ 185 ft each stopped due to  End of study, nl pace, no desats but stopped due to sob ?- 09/18/2018   Walked 0.5 lpm  x one lap @ 1=210 ft stopped due to  Sob s desats ?05/13/2019 Patient Saturations on Room Air at Rest = 97% then while Ambulating = 88% Patient Saturations on 4 Liters of pulsed oxygen while Ambulating = 94% ?05/03/2020 Re-qualifed for oxygen. O2 88% on RA after 1 lap, patients saturations on 4L while ambulating 95% ? ? ?rec as of  12/12/2021   3.5  lpm hs and adjust to keep > 90% walking  /sitting ? ?Advised Make sure you check your oxygen saturation  AT  your highest level of activity (not after you stop)   to be sure it stays over 90% and adjust  02 flow upward to maintain this level if needed but remember to turn it back to previous settings when you stop (to conserve your supply).  ? ?Also began end of life discussions about hospice/palliative care.   ?Though somewhat paradoxic, when the lung fails to clear C02 properly and pC02 rises the lung then becomes a more efficient scavenger of C02 allowing lower work of breathing and  better C02 clearance albeit at a higher serum pC02 level - this is why pts can look a lot better than their ABG's would suggest and why it's  so difficult to prognosticate endstage dz.  It's also why I strongly rec DNI status (ventilating pts down to a nl pC02 adversely affects this compensatory mechanism) >  Not interested in comfort rx at this pont so whould consider SNF next admit so he can do some rehab to prevent readmits ? ? ?    ?  ? ?Each maintenance medication was reviewed in detail including emphasizing most importantly the difference between maintenance and prns and under what circumstances the prns are to be triggered using an action plan format where appropriate. ? ?Total time for H and P, chart review, counseling, reviewing hfa/neb/02  device(s) and generating customized AVS unique to this office visit / same day charting  > 30 min post hosp ov ?     ? ? ?

## 2022-01-18 NOTE — Assessment & Plan Note (Signed)
Quit smoking 12/2021  ?- 02/14/2014   try symbicort 160 2bid  ?- PFT's  02/14/2014   FEV1 1.82 (53%) and ratio 47 no better p alb (p spiriva and symbicort 160)  and DLCO 32 with 37%  ?07/2014 >Spiriva stopped  ?09/15/2014 Med calendar> not using 02/20/2015   ?- Declined rehab 07/31/15  ?- Quit smoking 11/2016  ?- flutter valve 04/01/2016  ?-med calendar 08/20/16  ?- added prn pred to action plan 12/05/2016  ?- changed to maint pred 02/25/2017 with floor of 10 mg daily >>>  ?- 06/05/2017  After extensive coaching device effectiveness =    90% smi > change tudorza to spiriva 2.5 x 2 > unable to afford ?- 11/03/2017  After extensive coaching inhaler device  effectiveness =    90% with DPI/ tudorza  ?- add pepcid 20 mg at hs 01/30/2018 for noct/ am wheeze ?- Spirometry 03/16/2018  FEV1 1.16 (34%)  Ratio 57 p am symb/ tudorza ?- referred again to rehab 06/10/2018  And 07/27/2018  > declined 09/18/2018  ?- 07/27/2018  After extensive coaching inhaler device,  effectiveness =    90% with respimat  ?- 09/18/2018 flutter valve training and started daily pred with floor 10 mg  ?- 12/21/2018  After extensive coaching inhaler device,  effectiveness =    75% (short Ti)  ?- 12/12/2021  After extensive coaching inhaler device,  effectiveness =    80% ? ?- The proper method of use, as well as anticipated side effects, of a metered-dose inhaler were discussed and demonstrated to the patient using teach back method.   ? ?Pt is  Group D in terms of symptom/risk and laba/lama/ICS  therefore appropriate rx at this point >>>  breztri and approp saba plus pred 20 mg daily if breathing worse then 10 mg daily a floor ? ?Again reviewed: ?Re SABA :  I spent extra time with pt today reviewing appropriate use of albuterol for prn use on exertion with the following points: ?1) saba is for relief of sob that does not improve by walking a slower pace or resting but rather if the pt does not improve after trying this first. ?2) If the pt is convinced, as many are, that  saba helps recover from activity faster then it's easy to tell if this is the case by re-challenging : ie stop, take the inhaler, then p 5 minutes try the exact same activity (intensity of workload) that just caused the symptoms and see if they are substantially diminished or not after saba ?3) if there is an activity that reproducibly causes the symptoms, try the saba 15 min before the activity on alternate days  ? ?If in fact the saba really does help, then fine to continue to use it prn but advised may need to look closer at the maintenance regimen being used to achieve better control of airways disease with exertion.  ? ?  ?

## 2022-01-25 ENCOUNTER — Emergency Department (HOSPITAL_COMMUNITY)
Admission: EM | Admit: 2022-01-25 | Discharge: 2022-01-25 | Disposition: A | Discharge status: Home / Self Care | Payer: Medicaid Other | Attending: Emergency Medicine | Admitting: Emergency Medicine

## 2022-01-25 ENCOUNTER — Other Ambulatory Visit: Payer: Self-pay

## 2022-01-25 ENCOUNTER — Emergency Department (HOSPITAL_COMMUNITY): Payer: Medicaid Other

## 2022-01-25 ENCOUNTER — Encounter (HOSPITAL_COMMUNITY): Payer: Self-pay

## 2022-01-25 DIAGNOSIS — R609 Edema, unspecified: Secondary | ICD-10-CM | POA: Insufficient documentation

## 2022-01-25 DIAGNOSIS — D7589 Other specified diseases of blood and blood-forming organs: Secondary | ICD-10-CM | POA: Diagnosis not present

## 2022-01-25 DIAGNOSIS — J441 Chronic obstructive pulmonary disease with (acute) exacerbation: Secondary | ICD-10-CM | POA: Diagnosis not present

## 2022-01-25 DIAGNOSIS — I10 Essential (primary) hypertension: Secondary | ICD-10-CM | POA: Insufficient documentation

## 2022-01-25 DIAGNOSIS — Z79899 Other long term (current) drug therapy: Secondary | ICD-10-CM | POA: Diagnosis not present

## 2022-01-25 DIAGNOSIS — R0602 Shortness of breath: Secondary | ICD-10-CM | POA: Diagnosis present

## 2022-01-25 DIAGNOSIS — Z7951 Long term (current) use of inhaled steroids: Secondary | ICD-10-CM | POA: Diagnosis not present

## 2022-01-25 LAB — BASIC METABOLIC PANEL
Anion gap: 8 (ref 5–15)
BUN: 17 mg/dL (ref 6–20)
CO2: 31 mmol/L (ref 22–32)
Calcium: 9 mg/dL (ref 8.9–10.3)
Chloride: 99 mmol/L (ref 98–111)
Creatinine, Ser: 0.92 mg/dL (ref 0.61–1.24)
GFR, Estimated: >60 mL/min (ref >60)
Glucose, Bld: 98 mg/dL (ref 70–99)
Potassium: 4.1 mmol/L (ref 3.5–5.1)
Sodium: 138 mmol/L (ref 135–145)

## 2022-01-25 LAB — BRAIN NATRIURETIC PEPTIDE: B Natriuretic Peptide: 83 pg/mL (ref 0.0–100.0)

## 2022-01-25 LAB — CBC WITH DIFFERENTIAL/PLATELET
Abs Immature Granulocytes: 0.55 10*3/uL — ABNORMAL HIGH (ref 0.00–0.07)
Basophils Absolute: 0.1 10*3/uL (ref 0.0–0.1)
Basophils Relative: 1 %
Eosinophils Absolute: 0 10*3/uL (ref 0.0–0.5)
Eosinophils Relative: 0 %
HCT: 46.3 % (ref 39.0–52.0)
Hemoglobin: 14.6 g/dL (ref 13.0–17.0)
Immature Granulocytes: 3 %
Lymphocytes Relative: 11 %
Lymphs Abs: 2 10*3/uL (ref 0.7–4.0)
MCH: 32.2 pg (ref 26.0–34.0)
MCHC: 31.5 g/dL (ref 30.0–36.0)
MCV: 102 fL — ABNORMAL HIGH (ref 80.0–100.0)
Monocytes Absolute: 1.4 10*3/uL — ABNORMAL HIGH (ref 0.1–1.0)
Monocytes Relative: 8 %
Neutro Abs: 13.3 10*3/uL — ABNORMAL HIGH (ref 1.7–7.7)
Neutrophils Relative %: 77 %
Platelets: 319 10*3/uL (ref 150–400)
RBC: 4.54 MIL/uL (ref 4.22–5.81)
RDW: 15.9 % — ABNORMAL HIGH (ref 11.5–15.5)
WBC: 17.3 10*3/uL — ABNORMAL HIGH (ref 4.0–10.5)
nRBC: 0.1 % (ref 0.0–0.2)

## 2022-01-25 IMAGING — DX DG CHEST 1V PORT
2 series · 2 of 2 positions shown · non-contrast
Comparison: Chest radiographs [DATE] and earlier.

CLINICAL DATA: 60-year-old male with shortness of breath.
Hypertension, tachycardia.

EXAM:
PORTABLE CHEST 1 VIEW

[chest ap (1 of 2)]
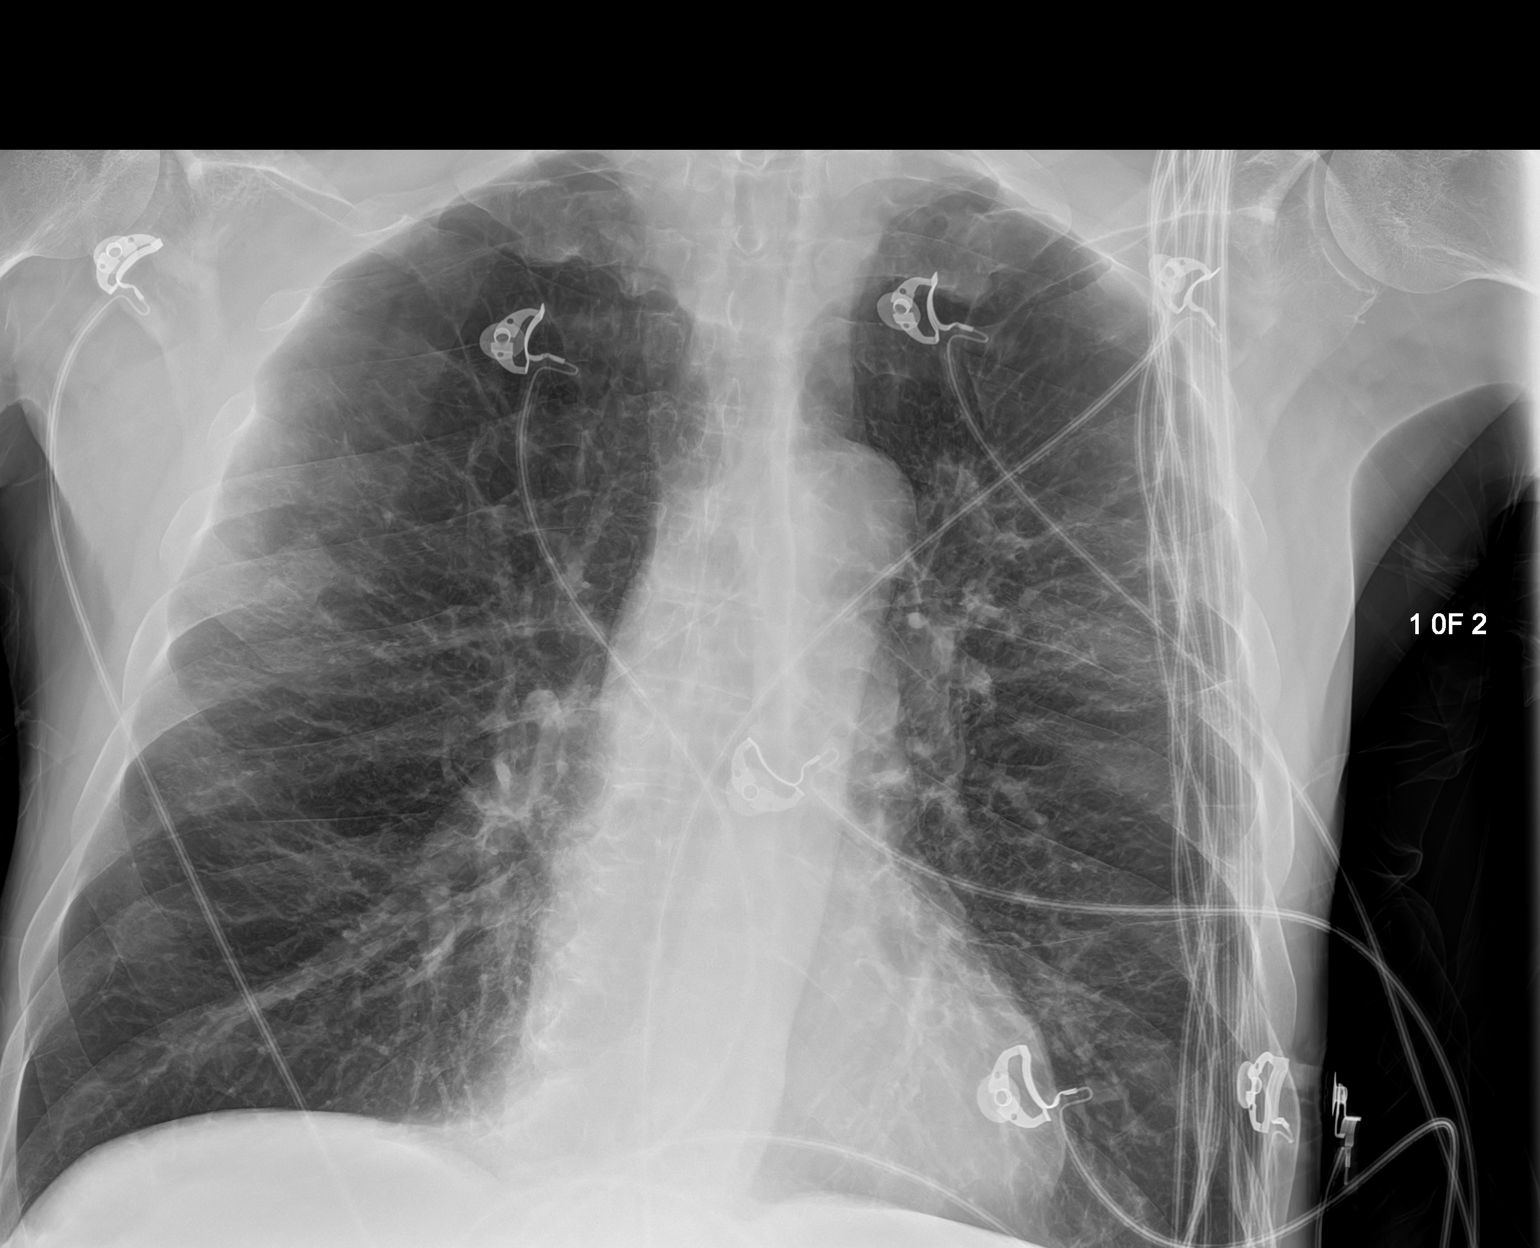

[chest ap (2 of 2)]
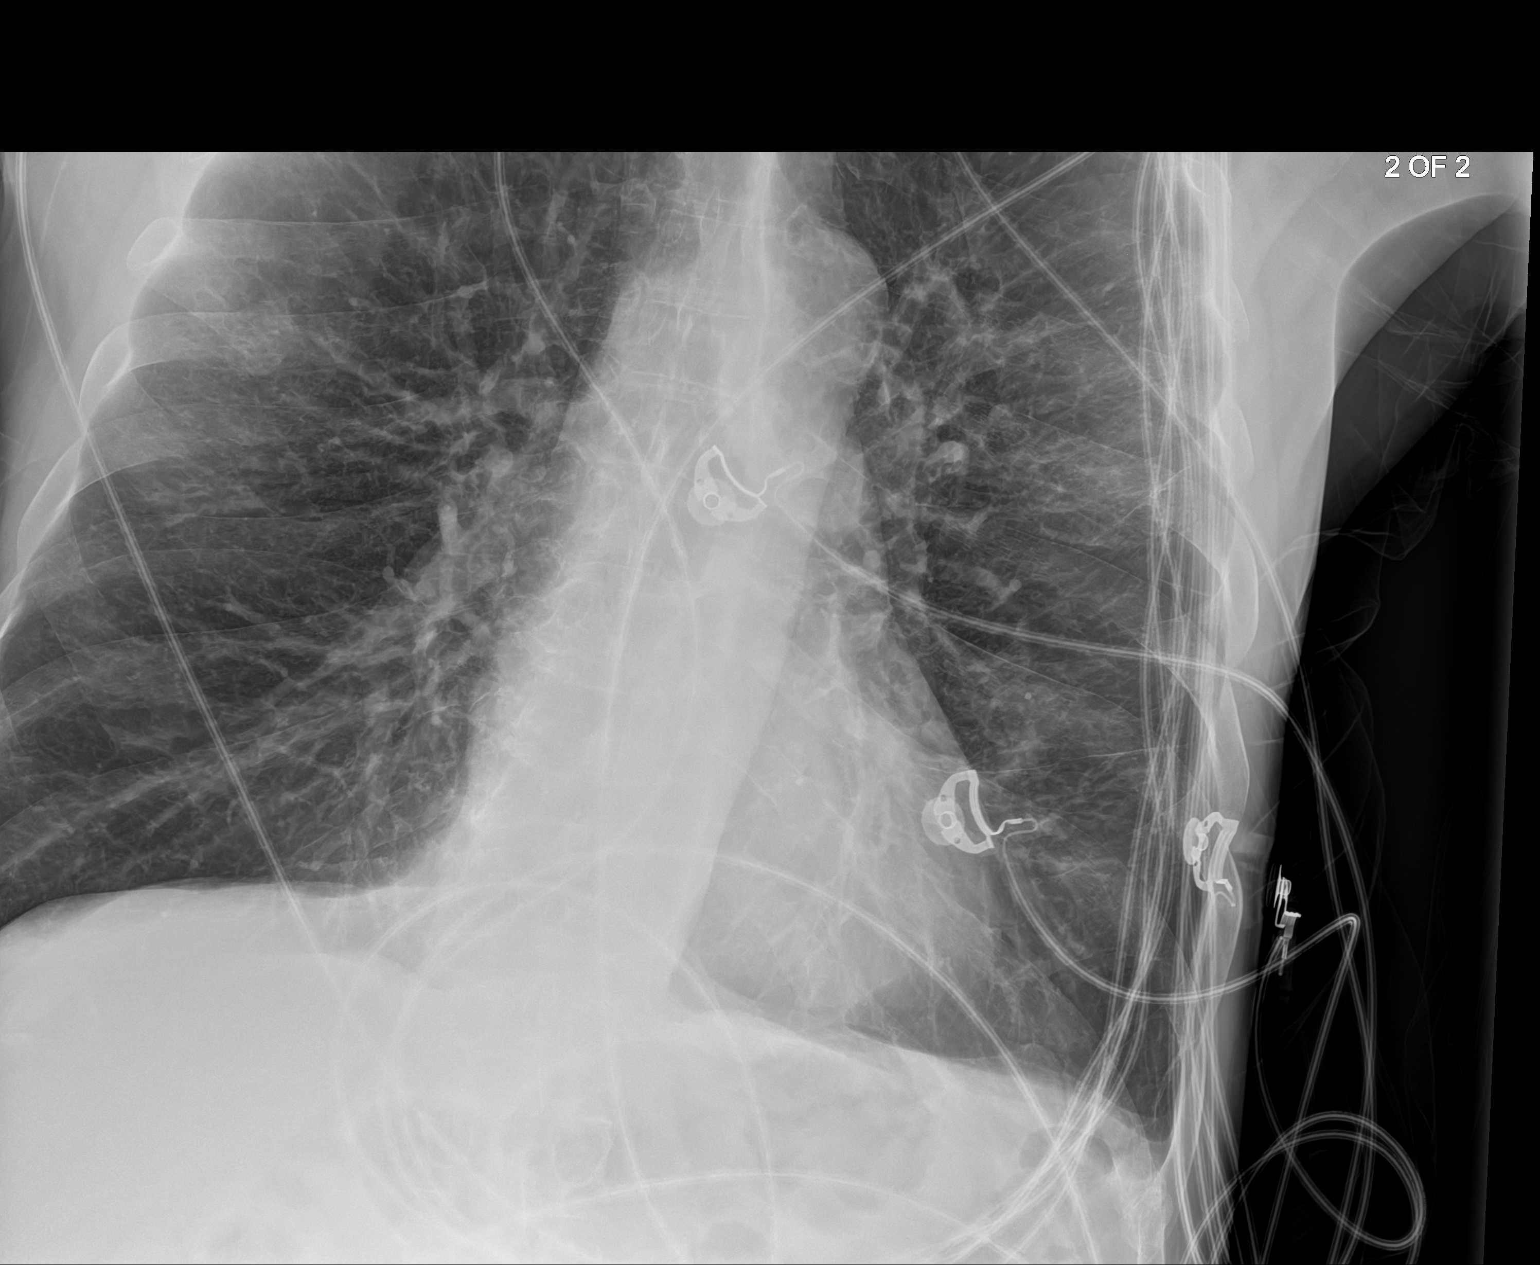

[2 of 2 positions shown; findings below may reference images not displayed]

FINDINGS: Portable AP semi upright view at [0R] hours. Pulmonary
hyperinflation. Centrilobular emphysema demonstrated on CT in
[REDACTED]. Asymmetric left suprahilar reticulonodular opacity has
nearly resolved since [DATE]. no superimposed pneumothorax,
pulmonary edema, pleural effusion or new pulmonary opacity. Chronic
right lateral 6th rib fracture responsible for asymmetric density
there. Mediastinal contours remain normal. Visualized tracheal air
column is within normal limits. No new osseous abnormality.
IMPRESSION: 1. Chronic lung disease with Emphysema ([0R]-[0R]).
2. Left suprahilar opacity favored to be infection has nearly
resolved since [DATE].
[DATE]. No new cardiopulmonary abnormality.

## 2022-01-25 MED ORDER — IPRATROPIUM-ALBUTEROL 0.5-2.5 (3) MG/3ML IN SOLN
3.0000 mL | Freq: Once | RESPIRATORY_TRACT | Status: AC
  Administered 2022-01-25: 3 mL via RESPIRATORY_TRACT
  Filled 2022-01-25: qty 3

## 2022-01-25 MED ORDER — MAGNESIUM SULFATE 2 GM/50ML IV SOLN
2.0000 g | Freq: Once | INTRAVENOUS | Status: AC
Start: 2022-01-25 — End: 2022-01-25
  Administered 2022-01-25: 2 g via INTRAVENOUS
  Filled 2022-01-25: qty 50

## 2022-01-25 MED ORDER — ALBUTEROL SULFATE HFA 108 (90 BASE) MCG/ACT IN AERS
2.0000 | INHALATION_SPRAY | RESPIRATORY_TRACT | Status: DC | PRN
  Filled 2022-01-25: qty 6.7

## 2022-01-25 MED ORDER — METHYLPREDNISOLONE SODIUM SUCC 125 MG IJ SOLR
125.0000 mg | Freq: Once | INTRAMUSCULAR | Status: AC
  Administered 2022-01-25: 125 mg via INTRAVENOUS
  Filled 2022-01-25: qty 2

## 2022-01-25 NOTE — Discharge Instructions (Signed)
Make sure you take your prednisone 40 mg each day for the next 3 days.  Then you can go back to alternating with 20 mg 1 day and 40 mg the next.  Return if any problems ?

## 2022-01-25 NOTE — ED Triage Notes (Addendum)
Rcems from home. Cc of SOB. Pt always on oxygen 4l. Pt smokes cigarettes.  ?Pt hypertensive and tachycardic. 98%. ?Hx of COPD . Recent pneumonia.  ?Gave neb otw ?

## 2022-01-25 NOTE — ED Notes (Signed)
Per patient request call Mr Fara Boros [phone] for needs ?

## 2022-01-25 NOTE — ED Notes (Signed)
Patient states his step father is bringing his portable oxygen for discharge.  ?

## 2022-01-25 NOTE — ED Provider Notes (Signed)
?[facility] ?Provider Note ? ? ?CSN: [id number] ?Arrival date & time: 01/25/22  0531 ? ?  ? ?History ? ?Chief Complaint  ?Patient presents with  ? Shortness of Breath  ? ? ?Tony Sullivan is a 61 y.o. male. ? ?The history is provided by the patient.  ?Shortness of Breath ?He has history of hypertension, COPD, bipolar disorder and comes in because of difficulty breathing.  He states that his breathing has been getting worse over the last 2 days, but he woke up tonight and felt that he could not breathe at all.  There has been a slight cough which is productive of small amount of clear sputum.  He denies fever, chills, sweats.  Denies any chest pain.  He denies any nausea or vomiting.  He did give himself a nebulizer treatment at home without any benefit.  He was recently hospitalized for pneumonia. ?  ?Home Medications ?Prior to Admission medications   ?Medication Sig Start Date End Date Taking? Authorizing Provider  ?acetaminophen (TYLENOL) 325 MG tablet Take 2 tablets (650 mg total) by mouth every 6 (six) hours as needed for mild pain (or Fever >/= 101). 10/30/21   [name], MD  ?albuterol (PROVENTIL) (2.5 MG/3ML) 0.083% nebulizer solution Take 3 mLs (2.5 mg total) by nebulization every 4 (four) hours as needed for wheezing or shortness of breath. 12/12/21 12/12/22  [name], MD  ?albuterol (VENTOLIN HFA) 108 (90 Base) MCG/ACT inhaler Inhale 2 puffs into the lungs every 6 (six) hours as needed for wheezing or shortness of breath. 01/09/22   [name], Pratik D, DO  ?atorvastatin (LIPITOR) 40 MG tablet TAKE ONE TABLET BY MOUTH DAILY ?Patient taking differently: Take 40 mg by mouth daily. 07/06/20   [name], MD  ?benztropine (COGENTIN) 0.5 MG tablet Take 0.5 mg by mouth at bedtime.    [provider]  ?Budeson-Glycopyrrol-Formoterol (BREZTRI AEROSPHERE) 160-9-4.8 MCG/ACT AERO Inhale 2 puffs into the lungs in the morning and at bedtime. 12/12/21   [name], MD  ?cetirizine  (ZYRTEC) 10 MG tablet Take 10 mg by mouth daily.    [provider]  ?dextromethorphan-guaiFENesin (MUCINEX DM) 30-600 MG 12hr tablet Take 1 tablet by mouth 2 (two) times daily as needed for cough. 01/13/22   [name], MD  ?diclofenac Sodium (VOLTAREN) 1 % GEL Apply 2 g topically 4 (four) times daily.    [provider]  ?dicyclomine (BENTYL) 20 MG tablet Take 1 tablet (20 mg total) by mouth 2 (two) times daily as needed for spasms. 12/15/21   [name], PA-C  ?dorzolamide (TRUSOPT) 2 % ophthalmic solution Place 1 drop into the left eye 2 (two) times daily. 12/12/20   [provider]  ?hydrochlorothiazide (HYDRODIURIL) 25 MG tablet Take 0.5 tablets (12.5 mg total) by mouth daily. 10/30/21   [name], MD  ?hydrOXYzine (ATARAX) 25 MG tablet Take 1 tablet (25 mg total) by mouth 3 (three) times daily as needed for anxiety or nausea. 10/30/21   [name], MD  ?latanoprost (XALATAN) 0.005 % ophthalmic solution Place 1 drop into both eyes at bedtime. 05/24/19   [provider]  ?loxapine (LOXITANE) 10 MG capsule Take 10 mg by mouth at bedtime.     [provider]  ?megestrol (MEGACE) 40 MG tablet Take 40 mg by mouth daily.    [provider]  ?mirtazapine (REMERON) 30 MG tablet Take 1 tablet (30 mg total) by mouth at bedtime. 10/30/21   Emokpae, Courage,  MD  ?Multiple Vitamin (MULTIVITAMIN WITH MINERALS) TABS tablet Take 1 tablet by mouth daily. 10/31/21   [name], MD  ?OXYGEN Inhale 3-4 L into the lungs as directed. 3-4 liters with sleep and exertion as needed for low oxygen    [provider]  ?pantoprazole (PROTONIX) 40 MG tablet Take 1 tablet (40 mg total) by mouth daily. 10/30/21   [name], MD  ?polyethylene glycol (MIRALAX / GLYCOLAX) 17 g packet TAKE 17 GRAMS BY MOUTH DAILY ?Patient taking differently: Take 17 g by mouth daily as needed for mild constipation. 03/09/20   [name], MD  ?predniSONE (DELTASONE) 10 MG  tablet Take 2 tablets (20 mg total) by mouth 2 (two) times daily with a meal. 01/09/22 02/08/22  [name], Pratik D, DO  ?risperiDONE (RISPERDAL) 2 MG tablet Take 1 tablet (2 mg total) by mouth at bedtime. 10/30/21   [name], MD  ?senna-docusate (SENOKOT-S) 8.6-50 MG tablet Take 2 tablets by mouth at bedtime. 10/30/21   [name], MD  ?simethicone (MYLICON) 40 GY/1.7CB drops Take 0.6 mLs (40 mg total) by mouth 4 (four) times daily as needed for flatulence. 10/17/21   Orpah Greek, MD  ?simethicone (MYLICON) 80 MG chewable tablet Chew 1 tablet (80 mg total) by mouth 4 (four) times daily as needed for flatulence. 01/09/22   [name], DO  ?   ? ?Allergies    ?Penicillins and Levocetirizine   ? ?Review of Systems   ?Review of Systems  ?Respiratory:  Positive for shortness of breath.   ?All other systems reviewed and are negative. ? ?Physical Exam ?Updated Vital Signs ?Pulse 98   Temp 97.9 ?F (36.6 ?C) (Oral)   Resp 19   Ht 5\' 11"  (1.803 m)   Wt 64.9 kg   SpO2 99%   BMI 19.94 kg/m?  ?Physical Exam ?Vitals and nursing note reviewed.  ?61 year old male, demonstrating pursed lip breathing, but is in no acute distress. Vital signs are normal. Oxygen saturation is 99%, which is normal. ?Head is normocephalic and atraumatic. PERRLA, EOMI. Oropharynx is clear. ?Neck is nontender and supple without adenopathy or JVD. ?Back is nontender and there is no CVA tenderness. ?Lungs have diminished airflow and prolonged exhalation phase without overt rales, wheezes, or rhonchi. ?Chest is nontender. ?Heart has regular rate and rhythm without murmur. ?Abdomen is soft, flat, nontender without masses or hepatosplenomegaly and peristalsis is normoactive. ?Extremities have 2+ pedal edema, full range of motion is present. ?Skin is warm and dry without rash. ?Neurologic: Mental status is normal, cranial nerves are intact, there are no motor or sensory deficits. ? ?ED Results / Procedures / Treatments   ?Labs ?(all labs  ordered are listed, but only abnormal results are displayed) ?Labs Reviewed  ?CBC WITH DIFFERENTIAL/PLATELET - Abnormal; Notable for the following components:  ?    Result Value  ? WBC 17.3 (*)   ? MCV 102.0 (*)   ? RDW 15.9 (*)   ? Neutro Abs 13.3 (*)   ? Monocytes Absolute 1.4 (*)   ? Abs Immature Granulocytes 0.55 (*)   ? All other components within normal limits  ?BASIC METABOLIC PANEL  ?BRAIN NATRIURETIC PEPTIDE  ? ? ?EKG ?EKG Interpretation ? ?Date/Time:  Friday January 25 2022 05:36:03 EDT ?Ventricular Rate:  90 ?PR Interval:  157 ?QRS Duration: 74 ?QT Interval:  344 ?QTC Calculation: 421 ?R Axis:   -80 ?Text Interpretation: Sinus rhythm Atrial premature complexes Biatrial enlargement Left anterior fascicular block Anteroseptal infarct,  age indeterminate When compared with ECG of 01/13/2022, No significant change was found Confirmed by Delora Fuel ([id number]) on 01/25/2022 5:48:43 AM ? ?Radiology ?DG Chest Port 1 View ? ?Result Date: 01/25/2022 ?CLINICAL DATA:  61 year old male with shortness of breath. Hypertension, tachycardia. EXAM: PORTABLE CHEST 1 VIEW COMPARISON:  Chest radiographs 01/13/2022 and earlier. FINDINGS: Portable AP semi upright view at 0551 hours. Pulmonary hyperinflation. Centrilobular emphysema demonstrated on CT in December. Asymmetric left suprahilar reticulonodular opacity has nearly resolved since 01/07/2022. no superimposed pneumothorax, pulmonary edema, pleural effusion or new pulmonary opacity. Chronic right lateral 6th rib fracture responsible for asymmetric density there. Mediastinal contours remain normal. Visualized tracheal air column is within normal limits. No new osseous abnormality. IMPRESSION: 1. Chronic lung disease with Emphysema (ICD10-J43.9). 2. Left suprahilar opacity favored to be infection has nearly resolved since 01/13/2022. 3. No new cardiopulmonary abnormality. Electronically Signed   By: Genevie Ann M.D.   On: 01/25/2022 06:44   ? ?Procedures ?Procedures  ?Cardiac monitor  shows normal sinus rhythm, per my interpretation. ? ?Medications Ordered in ED ?Medications - No data to display ? ?ED Course/ Medical Decision Making/ A&P ?  ?                        ?Medical Decision Makin

## 2022-01-25 NOTE — ED Notes (Signed)
Pt given blanket per request- informed Dr Roxanne Mins that pt is requesting a albuterol MDI to take home "not a prescription, the one we give out here".  ?

## 2022-01-25 NOTE — ED Notes (Signed)
Patient verbalized understanding of discharge instructions.  ?

## 2022-01-25 NOTE — ED Notes (Signed)
Patient given Ginger ale per request and MD approval.  ?

## 2022-01-28 ENCOUNTER — Ambulatory Visit: Payer: Medicaid Other | Admitting: Cardiology

## 2022-01-29 ENCOUNTER — Emergency Department (HOSPITAL_COMMUNITY)
Admission: EM | Admit: 2022-01-29 | Discharge: 2022-01-29 | Disposition: A | Discharge status: Home / Self Care | Payer: Medicaid Other | Source: Home / Self Care | Attending: Emergency Medicine | Admitting: Emergency Medicine

## 2022-01-29 ENCOUNTER — Emergency Department (HOSPITAL_COMMUNITY): Payer: Medicaid Other

## 2022-01-29 ENCOUNTER — Encounter (HOSPITAL_COMMUNITY): Payer: Self-pay | Admitting: *Deleted

## 2022-01-29 ENCOUNTER — Emergency Department (HOSPITAL_COMMUNITY)
Admission: EM | Admit: 2022-01-29 | Discharge: 2022-01-29 | Disposition: A | Discharge status: Home / Self Care | Payer: Medicaid Other | Attending: Emergency Medicine | Admitting: Emergency Medicine

## 2022-01-29 ENCOUNTER — Ambulatory Visit: Payer: Medicaid Other | Admitting: Cardiology

## 2022-01-29 DIAGNOSIS — J441 Chronic obstructive pulmonary disease with (acute) exacerbation: Secondary | ICD-10-CM | POA: Insufficient documentation

## 2022-01-29 DIAGNOSIS — Z7951 Long term (current) use of inhaled steroids: Secondary | ICD-10-CM | POA: Insufficient documentation

## 2022-01-29 DIAGNOSIS — Z5321 Procedure and treatment not carried out due to patient leaving prior to being seen by health care provider: Secondary | ICD-10-CM | POA: Diagnosis not present

## 2022-01-29 DIAGNOSIS — R0602 Shortness of breath: Secondary | ICD-10-CM | POA: Insufficient documentation

## 2022-01-29 IMAGING — DX DG CHEST 2V
3 series · 4 of 4 positions shown · non-contrast
Comparison: Chest x-ray [DATE].

CLINICAL DATA: Cough and shortness of breath.

EXAM:
CHEST - 2 VIEW

[Series 2: chest lat · 0.14mm/px · 2 of 2 slices shown]
[im 1/2]
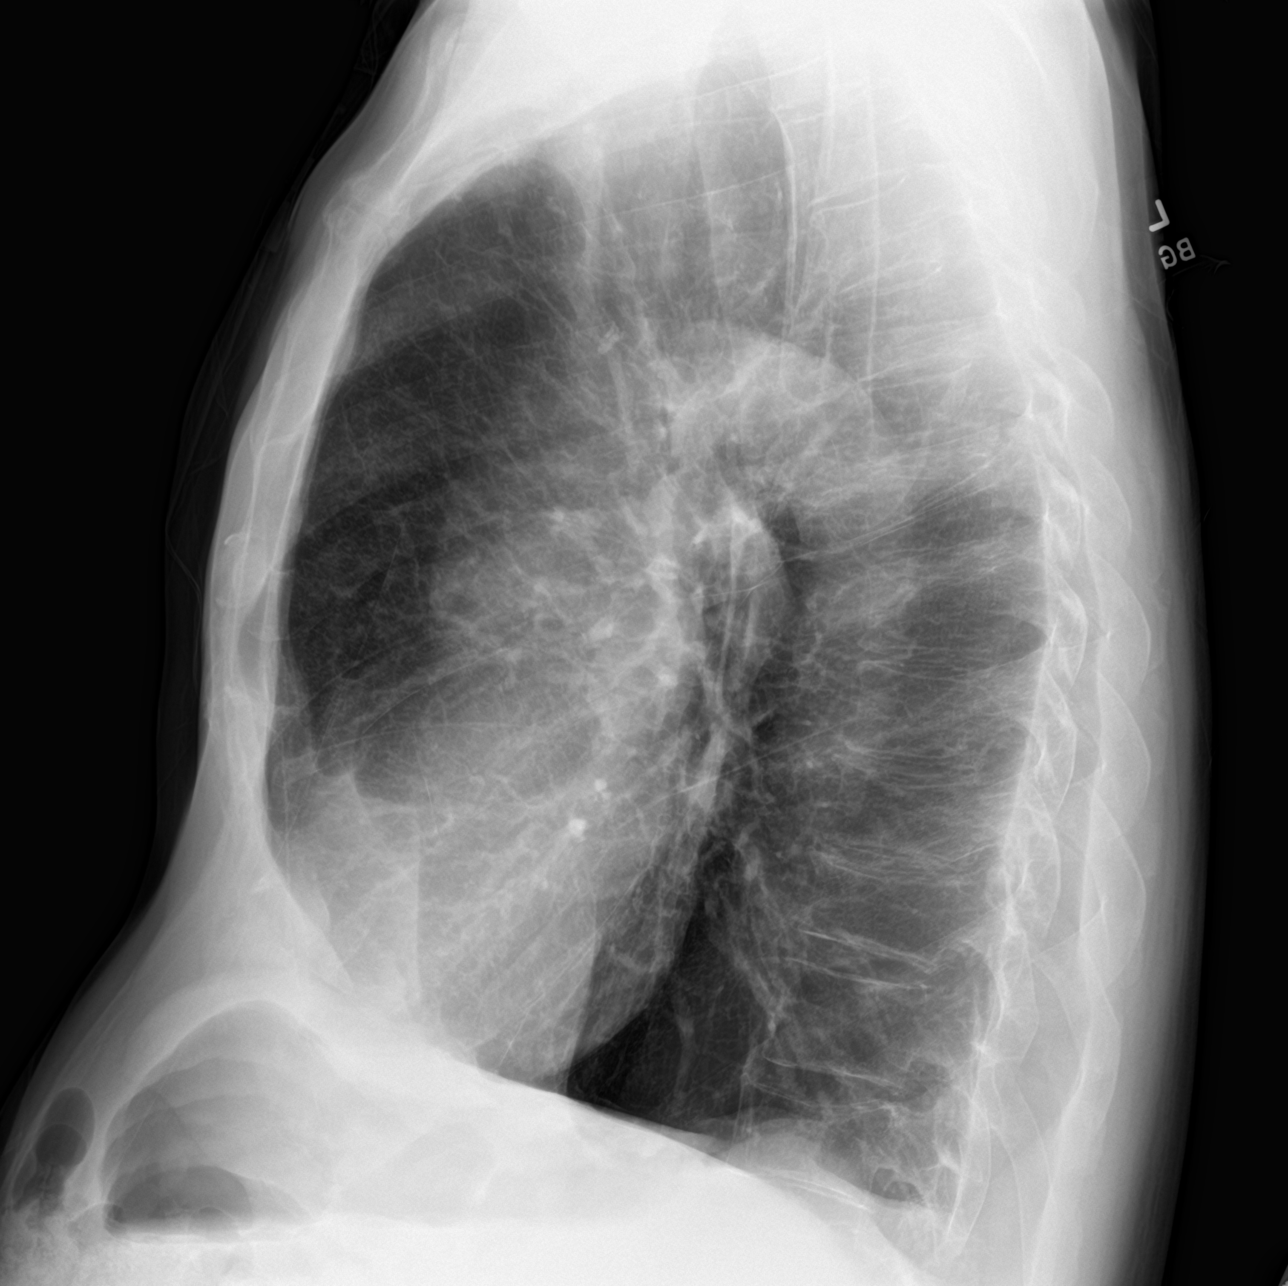
[im 2/2]
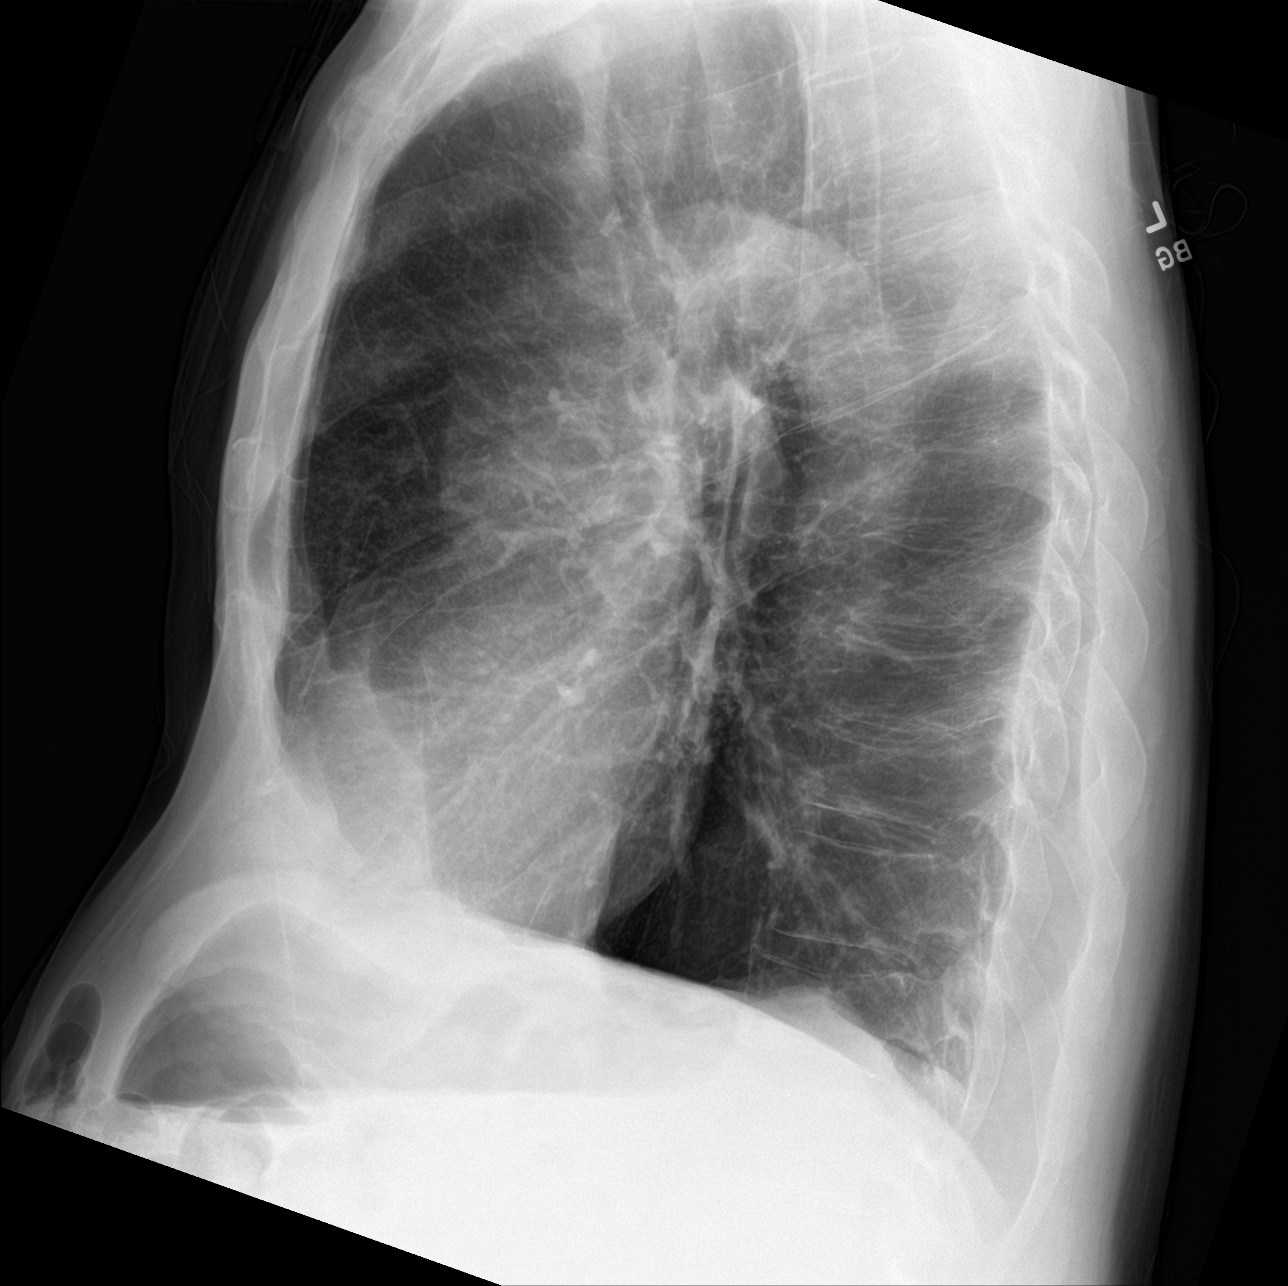

[chest ap (1 of 2)]
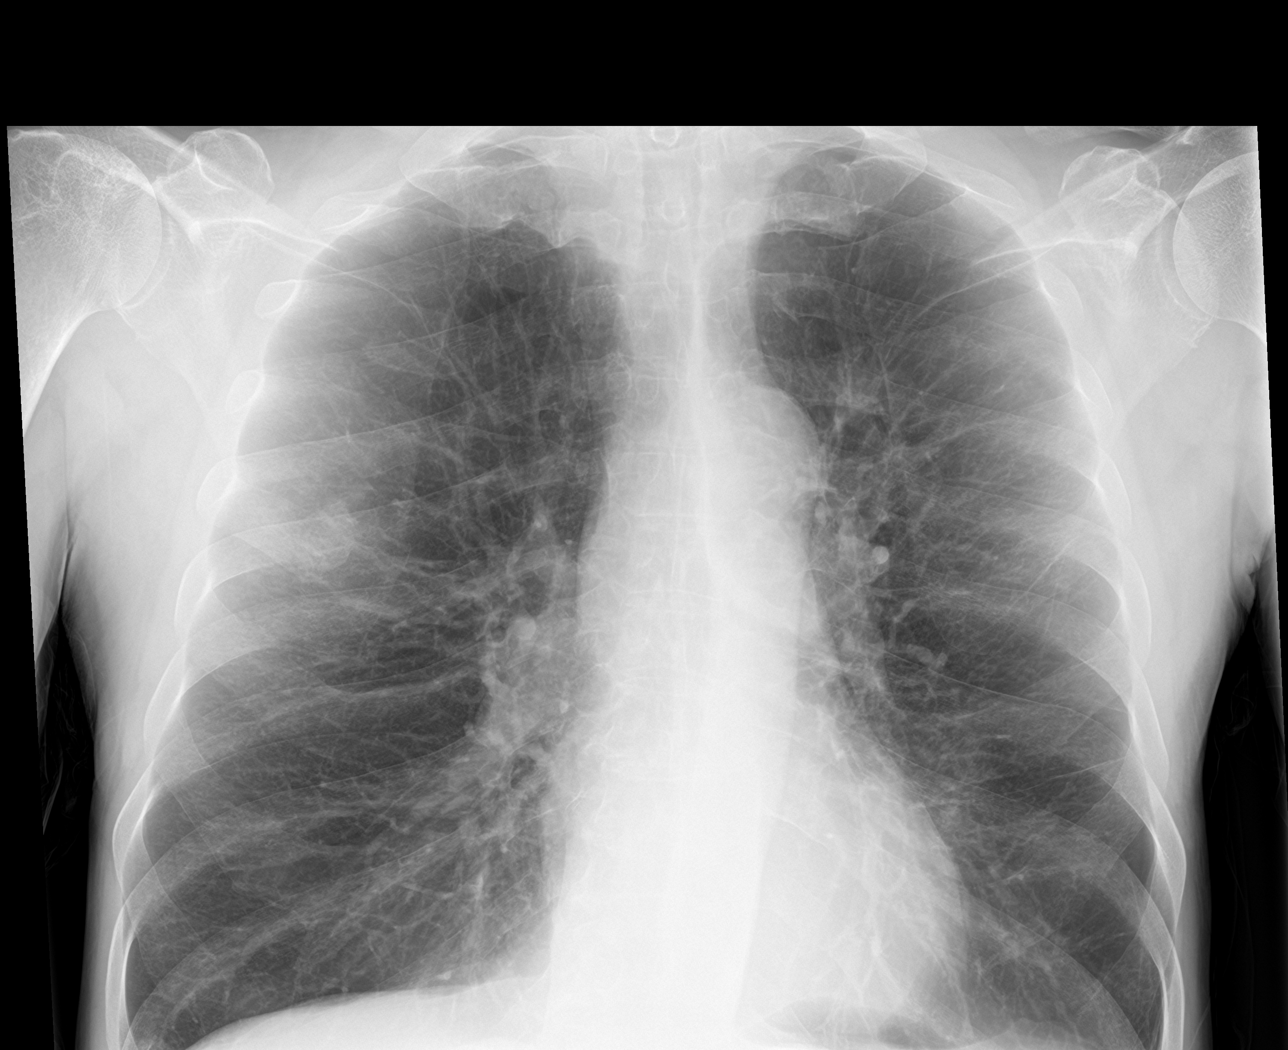

[chest ap (2 of 2)]
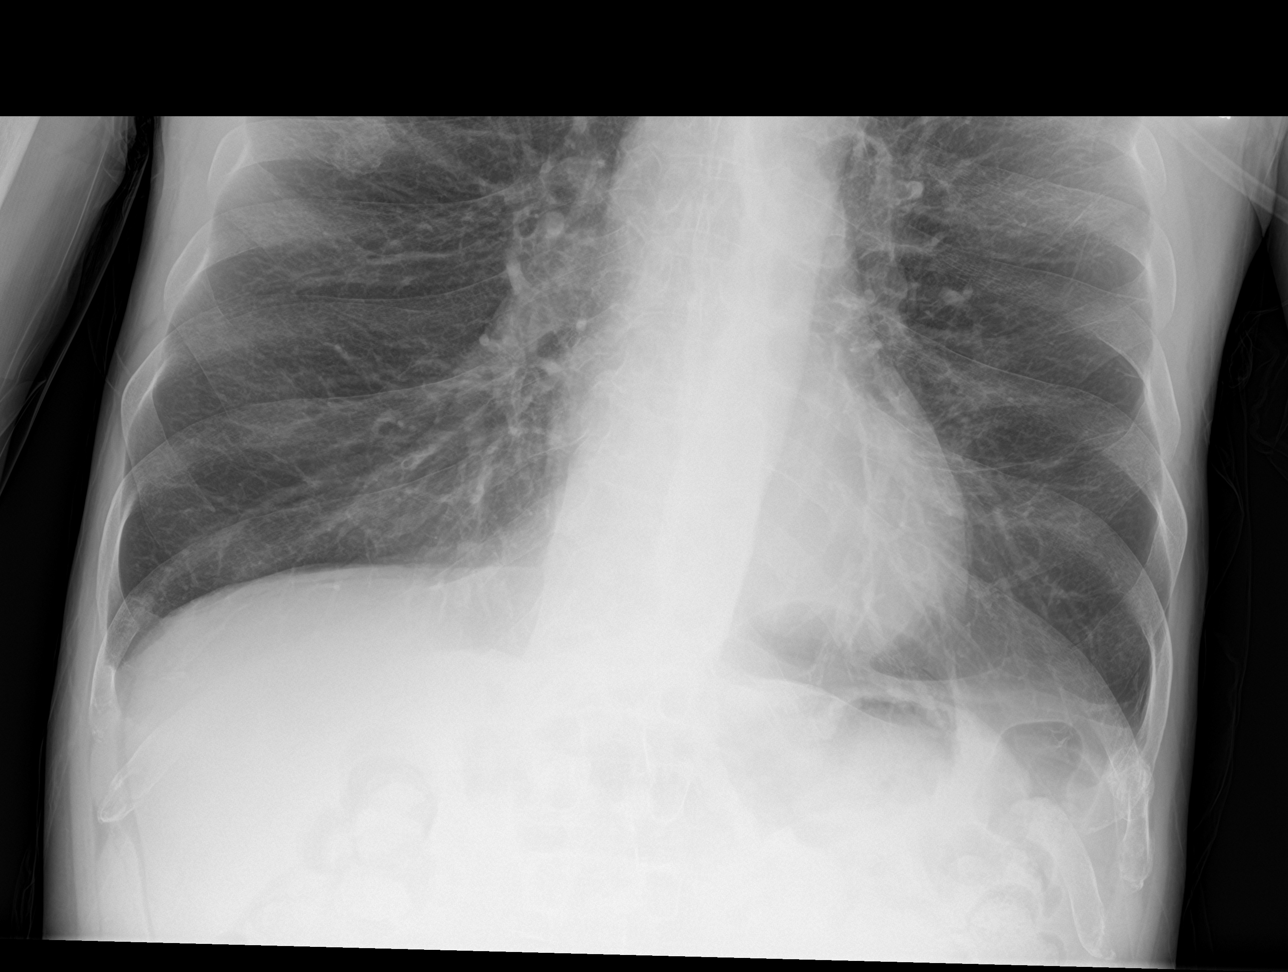

[4 of 4 positions shown; findings below may reference images not displayed]

FINDINGS: Lungs are hyperinflated, unchanged. There is no focal lung
infiltrate, pleural effusion or pneumothorax identified. There is a
healed right 6 rib fracture which is unchanged. No acute fractures
are identified.
IMPRESSION: 1. No acute cardiopulmonary process.
2. Stable changes of emphysema.

## 2022-01-29 MED ORDER — ALBUTEROL SULFATE (2.5 MG/3ML) 0.083% IN NEBU
2.5000 mg | INHALATION_SOLUTION | Freq: Once | RESPIRATORY_TRACT | Status: DC
Start: 2022-01-29 — End: 2022-01-29
  Filled 2022-01-29: qty 3

## 2022-01-29 MED ORDER — ALBUTEROL SULFATE (2.5 MG/3ML) 0.083% IN NEBU
INHALATION_SOLUTION | RESPIRATORY_TRACT | Status: AC
  Administered 2022-01-29: 2.5 mg via RESPIRATORY_TRACT
  Filled 2022-01-29: qty 3

## 2022-01-29 MED ORDER — ALBUTEROL SULFATE (2.5 MG/3ML) 0.083% IN NEBU
2.5000 mg | INHALATION_SOLUTION | Freq: Once | RESPIRATORY_TRACT | Status: AC
Start: 2022-01-29 — End: 2022-01-29

## 2022-01-29 MED ORDER — ALBUTEROL SULFATE HFA 108 (90 BASE) MCG/ACT IN AERS: 2.0000 | INHALATION_SPRAY | RESPIRATORY_TRACT | Status: DC | PRN

## 2022-01-29 NOTE — ED Triage Notes (Signed)
States he was out in the sun and thought he had better stop by and get a breathing treatment  ?

## 2022-01-29 NOTE — ED Notes (Signed)
Pt reports he has to leave to get his Granddaughter from the bus; does not have time to complete care  ?

## 2022-01-29 NOTE — Discharge Instructions (Signed)
Continue nebulizer treatments at home as needed ?

## 2022-01-29 NOTE — ED Triage Notes (Signed)
Return visit, states he went home and took 2 breathing treatments. States he had to leave earlier to care for grandchild ?

## 2022-01-31 NOTE — ED Provider Notes (Signed)
?[facility] ?Provider Note ? ? ?CSN: [id number] ?Arrival date & time: 01/29/22  1831 ? ?  ? ?History ? ?Chief Complaint  ?Patient presents with  ? Shortness of Breath  ? ? ?Tony Sullivan is a 61 y.o. male. ? ?The history is provided by the patient. No language interpreter was used.  ?Shortness of Breath ?Severity:  Moderate ?Onset quality:  Gradual ?Duration:  1 day ?Timing:  Constant ?Progression:  Worsening ?Chronicity:  New ?Context: activity   ?Relieved by:  Nothing ?Worsened by:  Nothing ?Associated symptoms: no abdominal pain, no chest pain and no fever   ?Risk factors: no prolonged immobilization   ? ?  ? ?Home Medications ?Prior to Admission medications   ?Medication Sig Start Date End Date Taking? Authorizing Provider  ?acetaminophen (TYLENOL) 325 MG tablet Take 2 tablets (650 mg total) by mouth every 6 (six) hours as needed for mild pain (or Fever >/= 101). 10/30/21   [name], MD  ?albuterol (PROVENTIL) (2.5 MG/3ML) 0.083% nebulizer solution Take 3 mLs (2.5 mg total) by nebulization every 4 (four) hours as needed for wheezing or shortness of breath. 12/12/21 12/12/22  [name], MD  ?albuterol (VENTOLIN HFA) 108 (90 Base) MCG/ACT inhaler Inhale 2 puffs into the lungs every 6 (six) hours as needed for wheezing or shortness of breath. 01/09/22   [name], Pratik D, DO  ?atorvastatin (LIPITOR) 40 MG tablet TAKE ONE TABLET BY MOUTH DAILY ?Patient taking differently: Take 40 mg by mouth daily. 07/06/20   [name], MD  ?benztropine (COGENTIN) 0.5 MG tablet Take 0.5 mg by mouth at bedtime.    [provider]  ?Budeson-Glycopyrrol-Formoterol (BREZTRI AEROSPHERE) 160-9-4.8 MCG/ACT AERO Inhale 2 puffs into the lungs in the morning and at bedtime. 12/12/21   [name], MD  ?cetirizine (ZYRTEC) 10 MG tablet Take 10 mg by mouth daily.    [provider]  ?dextromethorphan-guaiFENesin (MUCINEX DM) 30-600 MG 12hr tablet Take 1 tablet by mouth 2 (two) times daily as  needed for cough. 01/13/22   [name], MD  ?diclofenac Sodium (VOLTAREN) 1 % GEL Apply 2 g topically 4 (four) times daily.    [provider]  ?dicyclomine (BENTYL) 20 MG tablet Take 1 tablet (20 mg total) by mouth 2 (two) times daily as needed for spasms. 12/15/21   [name], PA-C  ?dorzolamide (TRUSOPT) 2 % ophthalmic solution Place 1 drop into the left eye 2 (two) times daily. 12/12/20   [provider]  ?hydrochlorothiazide (HYDRODIURIL) 25 MG tablet Take 0.5 tablets (12.5 mg total) by mouth daily. 10/30/21   [name], MD  ?hydrOXYzine (ATARAX) 25 MG tablet Take 1 tablet (25 mg total) by mouth 3 (three) times daily as needed for anxiety or nausea. 10/30/21   [name], MD  ?latanoprost (XALATAN) 0.005 % ophthalmic solution Place 1 drop into both eyes at bedtime. 05/24/19   [provider]  ?loxapine (LOXITANE) 10 MG capsule Take 10 mg by mouth at bedtime.     [provider]  ?megestrol (MEGACE) 40 MG tablet Take 40 mg by mouth daily.    [provider]  ?mirtazapine (REMERON) 30 MG tablet Take 1 tablet (30 mg total) by mouth at bedtime. 10/30/21   [name], MD  ?Multiple Vitamin (MULTIVITAMIN WITH MINERALS) TABS tablet Take 1 tablet by mouth daily. 10/31/21   [name], MD  ?OXYGEN Inhale 3-4 L into the lungs as directed. 3-4 liters with sleep and exertion as needed  for low oxygen    [provider]  ?pantoprazole (PROTONIX) 40 MG tablet Take 1 tablet (40 mg total) by mouth daily. 10/30/21   [name], MD  ?polyethylene glycol (MIRALAX / GLYCOLAX) 17 g packet TAKE 17 GRAMS BY MOUTH DAILY ?Patient taking differently: Take 17 g by mouth daily as needed for mild constipation. 03/09/20   [name], MD  ?predniSONE (DELTASONE) 10 MG tablet Take 2 tablets (20 mg total) by mouth 2 (two) times daily with a meal. 01/09/22 02/08/22  [name], Pratik D, DO  ?risperiDONE (RISPERDAL) 2 MG tablet Take 1 tablet (2 mg total) by mouth at  bedtime. 10/30/21   [name], MD  ?senna-docusate (SENOKOT-S) 8.6-50 MG tablet Take 2 tablets by mouth at bedtime. 10/30/21   [name], MD  ?simethicone (MYLICON) 40 ZJ/6.9CV drops Take 0.6 mLs (40 mg total) by mouth 4 (four) times daily as needed for flatulence. 10/17/21   Orpah Greek, MD  ?simethicone (MYLICON) 80 MG chewable tablet Chew 1 tablet (80 mg total) by mouth 4 (four) times daily as needed for flatulence. 01/09/22   [name], DO  ?   ? ?Allergies    ?Penicillins and Levocetirizine   ? ?Review of Systems   ?Review of Systems  ?Constitutional:  Negative for fever.  ?Respiratory:  Positive for shortness of breath.   ?Cardiovascular:  Negative for chest pain.  ?Gastrointestinal:  Negative for abdominal pain.  ?All other systems reviewed and are negative. ? ?Physical Exam ?Updated Vital Signs ?BP 112/87   Pulse 93   Temp 97.8 ?F (36.6 ?C) (Oral)   Resp (!) 21   Ht 5\' 11"  (1.803 m)   Wt 65.3 kg   SpO2 98%   BMI 20.08 kg/m?  ?Physical Exam ?Vitals and nursing note reviewed.  ?Constitutional:   ?   General: He is not in acute distress. ?   Appearance: He is well-developed.  ?HENT:  ?   Head: Normocephalic and atraumatic.  ?Eyes:  ?   Conjunctiva/sclera: Conjunctivae normal.  ?Cardiovascular:  ?   Rate and Rhythm: Normal rate and regular rhythm.  ?   Heart sounds: No murmur heard. ?Pulmonary:  ?   Effort: Pulmonary effort is normal. No respiratory distress.  ?   Breath sounds: Normal breath sounds.  ?Abdominal:  ?   Palpations: Abdomen is soft.  ?   Tenderness: There is no abdominal tenderness.  ?Musculoskeletal:     ?   General: No swelling.  ?   Cervical back: Neck supple.  ?Skin: ?   General: Skin is warm and dry.  ?   Capillary Refill: Capillary refill takes less than 2 seconds.  ?Neurological:  ?   Mental Status: He is alert.  ?Psychiatric:     ?   Mood and Affect: Mood normal.  ? ? ?ED Results / Procedures / Treatments   ?Labs ?(all labs ordered are listed, but only  abnormal results are displayed) ?Labs Reviewed - No data to display ? ?EKG ?None ? ?Radiology ?DG Chest 2 View ? ?Result Date: 01/29/2022 ?CLINICAL DATA:  Cough and shortness of breath. EXAM: CHEST - 2 VIEW COMPARISON:  Chest x-ray 01/25/2022. FINDINGS: Lungs are hyperinflated, unchanged. There is no focal lung infiltrate, pleural effusion or pneumothorax identified. There is a healed right 6 rib fracture which is unchanged. No acute fractures are identified. IMPRESSION: 1. No acute cardiopulmonary process. 2. Stable changes of emphysema. Electronically Signed   By: Ronney Asters M.D.   On:  01/29/2022 20:18   ? ?Procedures ?Procedures  ? ? ?Medications Ordered in ED ?Medications  ?albuterol (PROVENTIL) (2.5 MG/3ML) 0.083% nebulizer solution 2.5 mg (2.5 mg Nebulization Given 01/29/22 2156)  ? ? ?ED Course/ Medical Decision Making/ A&P ?  ?                        ?Medical Decision Making ?Pt has copd.  Pt request a breathing treatment  ? ?Amount and/or Complexity of Data Reviewed ?External Data Reviewed: notes. ?   Details: Pt sees Dr. [name] [facility],  Pulmonary notes reviewed ?Radiology: ordered and independent interpretation performed. Decision-making details documented in ED Course. ?   Details: Chest xray  no acute pulmonary disease ? ?Risk ?OTC drugs. ?Risk Details: Pt's lung are clear.  He feels better after breathing treatment  ? ? ? ? ? ? ? ? ? ? ?Final Clinical Impression(s) / ED Diagnoses ?Final diagnoses:  ?COPD exacerbation ([place])  ? ? ?Rx / DC Orders ?ED Discharge Orders   ? ? None  ? ?  ?.avs ? ?  ?Fransico Meadow, [place] ?01/31/22 1620 ? ?  ?[name], MD ?02/03/22 1130 ? ?

## 2022-02-03 ENCOUNTER — Emergency Department (HOSPITAL_COMMUNITY): Payer: Medicaid Other

## 2022-02-03 ENCOUNTER — Emergency Department (HOSPITAL_COMMUNITY)
Admission: EM | Admit: 2022-02-03 | Discharge: 2022-02-03 | Discharge status: Against Medical Advice | Payer: Medicaid Other | Source: Home / Self Care

## 2022-02-03 ENCOUNTER — Other Ambulatory Visit: Payer: Self-pay

## 2022-02-03 ENCOUNTER — Emergency Department (HOSPITAL_COMMUNITY)
Admission: EM | Admit: 2022-02-03 | Discharge: 2022-02-03 | Disposition: A | Discharge status: Home / Self Care | Payer: Medicaid Other | Attending: Emergency Medicine | Admitting: Emergency Medicine

## 2022-02-03 ENCOUNTER — Encounter (HOSPITAL_COMMUNITY): Payer: Self-pay

## 2022-02-03 DIAGNOSIS — Z7952 Long term (current) use of systemic steroids: Secondary | ICD-10-CM | POA: Diagnosis not present

## 2022-02-03 DIAGNOSIS — Z599 Problem related to housing and economic circumstances, unspecified: Secondary | ICD-10-CM | POA: Diagnosis not present

## 2022-02-03 DIAGNOSIS — Z91198 Patient's noncompliance with other medical treatment and regimen for other reason: Secondary | ICD-10-CM | POA: Insufficient documentation

## 2022-02-03 DIAGNOSIS — R7309 Other abnormal glucose: Secondary | ICD-10-CM | POA: Diagnosis not present

## 2022-02-03 DIAGNOSIS — Z720 Tobacco use: Secondary | ICD-10-CM | POA: Insufficient documentation

## 2022-02-03 DIAGNOSIS — J441 Chronic obstructive pulmonary disease with (acute) exacerbation: Secondary | ICD-10-CM | POA: Insufficient documentation

## 2022-02-03 DIAGNOSIS — R7989 Other specified abnormal findings of blood chemistry: Secondary | ICD-10-CM | POA: Diagnosis not present

## 2022-02-03 DIAGNOSIS — Z7951 Long term (current) use of inhaled steroids: Secondary | ICD-10-CM | POA: Insufficient documentation

## 2022-02-03 DIAGNOSIS — Z20822 Contact with and (suspected) exposure to covid-19: Secondary | ICD-10-CM | POA: Diagnosis not present

## 2022-02-03 DIAGNOSIS — D72829 Elevated white blood cell count, unspecified: Secondary | ICD-10-CM | POA: Diagnosis not present

## 2022-02-03 DIAGNOSIS — F17200 Nicotine dependence, unspecified, uncomplicated: Secondary | ICD-10-CM | POA: Insufficient documentation

## 2022-02-03 DIAGNOSIS — R0602 Shortness of breath: Secondary | ICD-10-CM | POA: Diagnosis present

## 2022-02-03 DIAGNOSIS — Z91148 Patient's other noncompliance with medication regimen for other reason: Secondary | ICD-10-CM

## 2022-02-03 LAB — CBC WITH DIFFERENTIAL/PLATELET
Abs Immature Granulocytes: 0.37 10*3/uL — ABNORMAL HIGH (ref 0.00–0.07)
Basophils Absolute: 0.1 10*3/uL (ref 0.0–0.1)
Basophils Relative: 1 %
Eosinophils Absolute: 0 10*3/uL (ref 0.0–0.5)
Eosinophils Relative: 0 %
HCT: 42.9 % (ref 39.0–52.0)
Hemoglobin: 13.2 g/dL (ref 13.0–17.0)
Immature Granulocytes: 3 %
Lymphocytes Relative: 6 %
Lymphs Abs: 0.7 10*3/uL (ref 0.7–4.0)
MCH: 31.7 pg (ref 26.0–34.0)
MCHC: 30.8 g/dL (ref 30.0–36.0)
MCV: 102.9 fL — ABNORMAL HIGH (ref 80.0–100.0)
Monocytes Absolute: 0.6 10*3/uL (ref 0.1–1.0)
Monocytes Relative: 6 %
Neutro Abs: 9.1 10*3/uL — ABNORMAL HIGH (ref 1.7–7.7)
Neutrophils Relative %: 84 %
Platelets: 193 10*3/uL (ref 150–400)
RBC: 4.17 MIL/uL — ABNORMAL LOW (ref 4.22–5.81)
RDW: 15.9 % — ABNORMAL HIGH (ref 11.5–15.5)
WBC: 10.9 10*3/uL — ABNORMAL HIGH (ref 4.0–10.5)
nRBC: 0 % (ref 0.0–0.2)

## 2022-02-03 LAB — BASIC METABOLIC PANEL
Anion gap: 8 (ref 5–15)
BUN: 28 mg/dL — ABNORMAL HIGH (ref 6–20)
CO2: 32 mmol/L (ref 22–32)
Calcium: 8.7 mg/dL — ABNORMAL LOW (ref 8.9–10.3)
Chloride: 104 mmol/L (ref 98–111)
Creatinine, Ser: 0.84 mg/dL (ref 0.61–1.24)
GFR, Estimated: >60 mL/min (ref >60)
Glucose, Bld: 160 mg/dL — ABNORMAL HIGH (ref 70–99)
Potassium: 3.8 mmol/L (ref 3.5–5.1)
Sodium: 144 mmol/L (ref 135–145)

## 2022-02-03 LAB — RESP PANEL BY RT-PCR (FLU A&B, COVID) ARPGX2
Influenza A by PCR: NEGATIVE
Influenza B by PCR: NEGATIVE
SARS Coronavirus 2 by RT PCR: NEGATIVE

## 2022-02-03 MED ORDER — PREDNISONE 10 MG PO TABS: 20.0000 mg | ORAL_TABLET | Freq: Every day | ORAL | 0 refills | Status: DC

## 2022-02-03 MED ORDER — ALBUTEROL (5 MG/ML) CONTINUOUS INHALATION SOLN: 10.0000 mg/h | INHALATION_SOLUTION | Freq: Once | RESPIRATORY_TRACT | Status: DC

## 2022-02-03 MED ORDER — ALBUTEROL SULFATE (2.5 MG/3ML) 0.083% IN NEBU
INHALATION_SOLUTION | RESPIRATORY_TRACT | Status: AC
Start: 2022-02-03 — End: 2022-02-03
  Administered 2022-02-03: 10 mg
  Filled 2022-02-03: qty 12

## 2022-02-03 MED ORDER — ALBUTEROL SULFATE HFA 108 (90 BASE) MCG/ACT IN AERS
2.0000 | INHALATION_SPRAY | Freq: Once | RESPIRATORY_TRACT | Status: DC
  Filled 2022-02-03: qty 6.7

## 2022-02-03 MED ORDER — ALBUTEROL (5 MG/ML) CONTINUOUS INHALATION SOLN
10.0000 mg/h | INHALATION_SOLUTION | Freq: Once | RESPIRATORY_TRACT | Status: DC
Start: 2022-02-03 — End: 2022-02-04

## 2022-02-03 MED ORDER — METHYLPREDNISOLONE SODIUM SUCC 125 MG IJ SOLR
125.0000 mg | Freq: Once | INTRAMUSCULAR | Status: AC
Start: 2022-02-03 — End: 2022-02-03
  Administered 2022-02-03: 125 mg via INTRAVENOUS
  Filled 2022-02-03: qty 2

## 2022-02-03 NOTE — ED Provider Notes (Signed)
?[facility] ?Provider Note ? ? ?CSN: [id number] ?Arrival date & time: 02/03/22  1124 ? ?  ? ?History ? ?Chief Complaint  ?Patient presents with  ? Shortness of Breath  ? ? ?Tony Sullivan is a 61 y.o. male. ? ?HPI ?Patient presents for evaluation of shortness of breath.  He feels it is because he does not have his albuterol nebulizer solution.  He states he ran out because of financial difficulty.  He denies chest pain.  He has a tight feeling in his chest when he takes a deep breath.  He is on chronic oxygen therapy and takes prednisone twice a day ?  ? ?Home Medications ?Prior to Admission medications   ?Medication Sig Start Date End Date Taking? Authorizing Provider  ?acetaminophen (TYLENOL) 325 MG tablet Take 2 tablets (650 mg total) by mouth every 6 (six) hours as needed for mild pain (or Fever >/= 101). 10/30/21  Yes Emokpae, Courage, MD  ?albuterol (PROVENTIL) (2.5 MG/3ML) 0.083% nebulizer solution Take 3 mLs (2.5 mg total) by nebulization every 4 (four) hours as needed for wheezing or shortness of breath. 12/12/21 12/12/22 Yes [name], MD  ?atorvastatin (LIPITOR) 40 MG tablet TAKE ONE TABLET BY MOUTH DAILY ?Patient taking differently: Take 40 mg by mouth daily. 07/06/20  Yes [name], MD  ?benztropine (COGENTIN) 0.5 MG tablet Take 0.5 mg by mouth at bedtime.   Yes [provider]  ?Budeson-Glycopyrrol-Formoterol (BREZTRI AEROSPHERE) 160-9-4.8 MCG/ACT AERO Inhale 2 puffs into the lungs in the morning and at bedtime. 12/12/21  Yes [name], MD  ?cetirizine (ZYRTEC) 10 MG tablet Take 10 mg by mouth daily.   Yes [provider]  ?dextromethorphan-guaiFENesin (MUCINEX DM) 30-600 MG 12hr tablet Take 1 tablet by mouth 2 (two) times daily as needed for cough. 01/13/22  Yes [name], MD  ?diclofenac Sodium (VOLTAREN) 1 % GEL Apply 2 g topically 4 (four) times daily.   Yes [provider]  ?dicyclomine (BENTYL) 20 MG tablet Take 1 tablet (20 mg total) by  mouth 2 (two) times daily as needed for spasms. 12/15/21  Yes [name], PA-C  ?dorzolamide (TRUSOPT) 2 % ophthalmic solution Place 1 drop into the left eye 2 (two) times daily. 12/12/20  Yes [provider]  ?hydrochlorothiazide (HYDRODIURIL) 25 MG tablet Take 0.5 tablets (12.5 mg total) by mouth daily. 10/30/21  Yes [name], MD  ?hydrOXYzine (ATARAX) 25 MG tablet Take 1 tablet (25 mg total) by mouth 3 (three) times daily as needed for anxiety or nausea. 10/30/21  Yes Emokpae, Courage, MD  ?latanoprost (XALATAN) 0.005 % ophthalmic solution Place 1 drop into both eyes at bedtime. 05/24/19  Yes [provider]  ?loxapine (LOXITANE) 10 MG capsule Take 10 mg by mouth at bedtime.    Yes [provider]  ?megestrol (MEGACE) 40 MG tablet Take 40 mg by mouth daily.   Yes [provider]  ?mirtazapine (REMERON) 30 MG tablet Take 1 tablet (30 mg total) by mouth at bedtime. 10/30/21  Yes [name], MD  ?Multiple Vitamin (MULTIVITAMIN WITH MINERALS) TABS tablet Take 1 tablet by mouth daily. 10/31/21  Yes Emokpae, Courage, MD  ?OXYGEN Inhale 3-4 L into the lungs as directed. 3-4 liters with sleep and exertion as needed for low oxygen   Yes [provider]  ?pantoprazole (PROTONIX) 40 MG tablet Take 1 tablet (40 mg total) by mouth daily. 10/30/21  Yes [name], MD  ?polyethylene glycol (MIRALAX / GLYCOLAX) 17  g packet TAKE 17 GRAMS BY MOUTH DAILY ?Patient taking differently: Take 17 g by mouth daily as needed for mild constipation. 03/09/20  Yes [name], MD  ?predniSONE (DELTASONE) 10 MG tablet Take 2 tablets (20 mg total) by mouth 2 (two) times daily with a meal. 01/09/22 02/08/22 Yes Shah, Pratik D, DO  ?risperiDONE (RISPERDAL) 2 MG tablet Take 1 tablet (2 mg total) by mouth at bedtime. 10/30/21  Yes Emokpae, Courage, MD  ?albuterol (VENTOLIN HFA) 108 (90 Base) MCG/ACT inhaler Inhale 2 puffs into the lungs every 6 (six) hours as needed for wheezing or shortness  of breath. ?Patient not taking: Reported on 02/03/2022 01/09/22   [name], DO  ?senna-docusate (SENOKOT-S) 8.6-50 MG tablet Take 2 tablets by mouth at bedtime. ?Patient not taking: Reported on 02/03/2022 10/30/21   [name], MD  ?simethicone (MYLICON) 40 HY/8.5OY drops Take 0.6 mLs (40 mg total) by mouth 4 (four) times daily as needed for flatulence. ?Patient not taking: Reported on 02/03/2022 10/17/21   Orpah Greek, MD  ?simethicone (MYLICON) 80 MG chewable tablet Chew 1 tablet (80 mg total) by mouth 4 (four) times daily as needed for flatulence. ?Patient not taking: Reported on 02/03/2022 01/09/22   [name], DO  ?   ? ?Allergies    ?Penicillins and Levocetirizine   ? ?Review of Systems   ?Review of Systems ? ?Physical Exam ?Updated Vital Signs ?BP (!) 133/91   Pulse 90   Temp 98.1 ?F (36.7 ?C) (Oral)   Resp (!) 22   Ht 5\' 11"  (1.803 m)   Wt 65.3 kg   SpO2 100%   BMI 20.08 kg/m?  ?Physical Exam ?Vitals and nursing note reviewed.  ?Constitutional:   ?   Appearance: He is well-developed. He is not ill-appearing.  ?HENT:  ?   Head: Normocephalic and atraumatic.  ?   Right Ear: External ear normal.  ?   Left Ear: External ear normal.  ?Eyes:  ?   Conjunctiva/sclera: Conjunctivae normal.  ?   Pupils: Pupils are equal, round, and reactive to light.  ?Neck:  ?   Trachea: Phonation normal.  ?Cardiovascular:  ?   Rate and Rhythm: Normal rate and regular rhythm.  ?   Heart sounds: Normal heart sounds.  ?Pulmonary:  ?   Effort: Pulmonary effort is normal. No respiratory distress.  ?   Breath sounds: No stridor.  ?   Comments: Decreased air well bilaterally with few scattered end expiratory wheezes.  Scattered rhonchi.  No rales.  There is no increased work of breathing. ?Abdominal:  ?   General: There is no distension.  ?   Palpations: Abdomen is soft.  ?   Tenderness: There is no abdominal tenderness.  ?Musculoskeletal:     ?   General: Normal range of motion.  ?   Cervical back: Normal range of  motion and neck supple.  ?Skin: ?   General: Skin is warm and dry.  ?Neurological:  ?   Mental Status: He is alert and oriented to person, place, and time.  ?   Cranial Nerves: No cranial nerve deficit.  ?   Sensory: No sensory deficit.  ?   Motor: No abnormal muscle tone.  ?   Coordination: Coordination normal.  ?Psychiatric:     ?   Mood and Affect: Mood normal.     ?   Behavior: Behavior normal.     ?   Thought Content: Thought content normal.     ?  Judgment: Judgment normal.  ? ? ?ED Results / Procedures / Treatments   ?Labs ?(all labs ordered are listed, but only abnormal results are displayed) ?Labs Reviewed  ?BASIC METABOLIC PANEL - Abnormal; Notable for the following components:  ?    Result Value  ? Glucose, Bld 160 (*)   ? BUN 28 (*)   ? Calcium 8.7 (*)   ? All other components within normal limits  ?CBC WITH DIFFERENTIAL/PLATELET - Abnormal; Notable for the following components:  ? WBC 10.9 (*)   ? RBC 4.17 (*)   ? MCV 102.9 (*)   ? RDW 15.9 (*)   ? Neutro Abs 9.1 (*)   ? Abs Immature Granulocytes 0.37 (*)   ? All other components within normal limits  ?RESP PANEL BY RT-PCR (FLU A&B, COVID) ARPGX2  ? ? ?EKG ?None ? ?Radiology ?No results found. ? ?Procedures ?Procedures  ? ? ?Medications Ordered in ED ?Medications  ?albuterol (PROVENTIL,VENTOLIN) solution continuous neb (10 mg/hr Nebulization Not Given 02/03/22 1324)  ?albuterol (PROVENTIL,VENTOLIN) solution continuous neb (has no administration in time range)  ?methylPREDNISolone sodium succinate (SOLU-MEDROL) 125 mg/2 mL injection 125 mg (125 mg Intravenous Given 02/03/22 1223)  ?albuterol (PROVENTIL) (2.5 MG/3ML) 0.083% nebulizer solution (10 mg  Given 02/03/22 1258)  ? ? ?ED Course/ Medical Decision Making/ A&P ?Clinical Course as of 02/03/22 1635  ?Sun Feb 03, 2022  ?1634 Dr. Roderic Palau to evaluate for disposition after completing second nebulizer treatment [EW]  ?  ?Clinical Course User Index ?[EW] Daleen Bo, MD  ? ?                        ?Medical  Decision Making ?Patient presenting for evaluation of COPD exacerbation after running out of his albuterol nebulizer.  He states he has been having some financial difficulty. ? ?Amount and/or Complexity of Dat

## 2022-02-03 NOTE — Discharge Instructions (Signed)
Follow-up with your doctor this week for recheck.  Return if problem ?

## 2022-02-03 NOTE — ED Triage Notes (Signed)
Patient with complaints of shortness of breath. Takes at home neb treatments but is out of albuterol.  ?

## 2022-02-09 ENCOUNTER — Encounter (HOSPITAL_COMMUNITY): Payer: Self-pay | Admitting: Emergency Medicine

## 2022-02-09 ENCOUNTER — Emergency Department (HOSPITAL_COMMUNITY)
Admission: EM | Admit: 2022-02-09 | Discharge: 2022-02-09 | Disposition: A | Discharge status: Home / Self Care | Payer: Medicaid Other | Attending: Emergency Medicine | Admitting: Emergency Medicine

## 2022-02-09 DIAGNOSIS — Z87891 Personal history of nicotine dependence: Secondary | ICD-10-CM | POA: Diagnosis not present

## 2022-02-09 DIAGNOSIS — J449 Chronic obstructive pulmonary disease, unspecified: Secondary | ICD-10-CM | POA: Insufficient documentation

## 2022-02-09 DIAGNOSIS — Z7952 Long term (current) use of systemic steroids: Secondary | ICD-10-CM | POA: Diagnosis not present

## 2022-02-09 DIAGNOSIS — Z7951 Long term (current) use of inhaled steroids: Secondary | ICD-10-CM | POA: Diagnosis not present

## 2022-02-09 DIAGNOSIS — X088XXA Exposure to other specified smoke, fire and flames, initial encounter: Secondary | ICD-10-CM | POA: Insufficient documentation

## 2022-02-09 DIAGNOSIS — T310 Burns involving less than 10% of body surface: Secondary | ICD-10-CM | POA: Insufficient documentation

## 2022-02-09 DIAGNOSIS — T2014XA Burn of first degree of nose (septum), initial encounter: Secondary | ICD-10-CM | POA: Insufficient documentation

## 2022-02-09 DIAGNOSIS — I10 Essential (primary) hypertension: Secondary | ICD-10-CM | POA: Diagnosis not present

## 2022-02-09 DIAGNOSIS — T2010XA Burn of first degree of head, face, and neck, unspecified site, initial encounter: Secondary | ICD-10-CM

## 2022-02-09 DIAGNOSIS — Z79899 Other long term (current) drug therapy: Secondary | ICD-10-CM | POA: Diagnosis not present

## 2022-02-09 MED ORDER — ALBUTEROL SULFATE HFA 108 (90 BASE) MCG/ACT IN AERS
1.0000 | INHALATION_SPRAY | Freq: Once | RESPIRATORY_TRACT | Status: AC
Start: 2022-02-09 — End: 2022-02-09
  Administered 2022-02-09: 1 via RESPIRATORY_TRACT
  Filled 2022-02-09: qty 6.7

## 2022-02-09 MED ORDER — BACITRACIN ZINC 500 UNIT/GM EX OINT
TOPICAL_OINTMENT | Freq: Once | CUTANEOUS | Status: AC
Start: 2022-02-09 — End: 2022-02-09
  Filled 2022-02-09: qty 0.9

## 2022-02-09 MED ORDER — ALBUTEROL SULFATE (2.5 MG/3ML) 0.083% IN NEBU
2.5000 mg | INHALATION_SOLUTION | Freq: Once | RESPIRATORY_TRACT | Status: AC
  Administered 2022-02-09: 2.5 mg via RESPIRATORY_TRACT
  Filled 2022-02-09: qty 3
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
## 2022-02-09 NOTE — Discharge Instructions (Signed)
Please stop smoking cigarettes with your oxygen on.  Please keep the burns moist.  Follow-up with your primary care for further evaluation.  Return to the emergency department for any worsening symptoms ?
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
## 2022-02-11 ENCOUNTER — Other Ambulatory Visit: Payer: Self-pay

## 2022-02-11 ENCOUNTER — Encounter (HOSPITAL_COMMUNITY): Payer: Self-pay

## 2022-02-11 ENCOUNTER — Emergency Department (HOSPITAL_COMMUNITY)
Admission: EM | Admit: 2022-02-11 | Discharge: 2022-02-11 | Disposition: A | Discharge status: Home / Self Care | Payer: Medicaid Other | Attending: Emergency Medicine | Admitting: Emergency Medicine

## 2022-02-11 DIAGNOSIS — R600 Localized edema: Secondary | ICD-10-CM | POA: Insufficient documentation

## 2022-02-11 DIAGNOSIS — R062 Wheezing: Secondary | ICD-10-CM | POA: Insufficient documentation

## 2022-02-11 DIAGNOSIS — R0602 Shortness of breath: Secondary | ICD-10-CM | POA: Diagnosis present

## 2022-02-11 MED ORDER — ALBUTEROL SULFATE HFA 108 (90 BASE) MCG/ACT IN AERS
INHALATION_SPRAY | RESPIRATORY_TRACT | Status: AC
  Filled 2022-02-11: qty 6.7

## 2022-02-11 MED ORDER — ALBUTEROL SULFATE HFA 108 (90 BASE) MCG/ACT IN AERS
2.0000 | INHALATION_SPRAY | Freq: Once | RESPIRATORY_TRACT | Status: AC
  Administered 2022-02-11: 2 via RESPIRATORY_TRACT
  Filled 2022-02-11: qty 6.7
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
## 2022-02-12 ENCOUNTER — Emergency Department (HOSPITAL_COMMUNITY)
Admission: EM | Admit: 2022-02-12 | Discharge: 2022-02-12 | Discharge status: Against Medical Advice | Payer: Medicaid Other | Attending: Emergency Medicine | Admitting: Emergency Medicine

## 2022-02-12 ENCOUNTER — Encounter (HOSPITAL_COMMUNITY): Payer: Self-pay | Admitting: *Deleted

## 2022-02-12 ENCOUNTER — Other Ambulatory Visit: Payer: Self-pay

## 2022-02-12 DIAGNOSIS — R042 Hemoptysis: Secondary | ICD-10-CM | POA: Diagnosis present

## 2022-02-12 DIAGNOSIS — Z5321 Procedure and treatment not carried out due to patient leaving prior to being seen by health care provider: Secondary | ICD-10-CM | POA: Diagnosis not present

## 2022-02-12 DIAGNOSIS — R0789 Other chest pain: Secondary | ICD-10-CM | POA: Diagnosis not present

## 2022-02-12 NOTE — ED Notes (Signed)
Called, no answer.
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
## 2022-02-13 DIAGNOSIS — R062 Wheezing: Secondary | ICD-10-CM | POA: Insufficient documentation

## 2022-02-13 DIAGNOSIS — R042 Hemoptysis: Secondary | ICD-10-CM | POA: Diagnosis present

## 2022-02-13 DIAGNOSIS — R059 Cough, unspecified: Secondary | ICD-10-CM | POA: Diagnosis not present

## 2022-02-13 NOTE — ED Notes (Signed)
Pt stated he was out of his personal oxygen, this RN gave pt a portable oxygen tank. Pt currently on 4L [place] ?

## 2022-02-14 ENCOUNTER — Emergency Department (HOSPITAL_COMMUNITY): Payer: Medicaid Other

## 2022-02-14 ENCOUNTER — Emergency Department (HOSPITAL_COMMUNITY)
Admission: EM | Admit: 2022-02-14 | Discharge: 2022-02-14 | Disposition: A | Discharge status: Home / Self Care | Payer: Medicaid Other | Attending: Emergency Medicine | Admitting: Emergency Medicine

## 2022-02-14 ENCOUNTER — Encounter (HOSPITAL_COMMUNITY): Payer: Self-pay | Admitting: Emergency Medicine

## 2022-02-14 DIAGNOSIS — R042 Hemoptysis: Secondary | ICD-10-CM

## 2022-02-14 LAB — CBC WITH DIFFERENTIAL/PLATELET
Abs Immature Granulocytes: 0.27 10*3/uL — ABNORMAL HIGH (ref 0.00–0.07)
Basophils Absolute: 0.1 10*3/uL (ref 0.0–0.1)
Basophils Relative: 0 %
Eosinophils Absolute: 0.1 10*3/uL (ref 0.0–0.5)
Eosinophils Relative: 0 %
HCT: 41 % (ref 39.0–52.0)
Hemoglobin: 13 g/dL (ref 13.0–17.0)
Immature Granulocytes: 2 %
Lymphocytes Relative: 17 %
Lymphs Abs: 1.9 10*3/uL (ref 0.7–4.0)
MCH: 32.2 pg (ref 26.0–34.0)
MCHC: 31.7 g/dL (ref 30.0–36.0)
MCV: 101.5 fL — ABNORMAL HIGH (ref 80.0–100.0)
Monocytes Absolute: 0.9 10*3/uL (ref 0.1–1.0)
Monocytes Relative: 8 %
Neutro Abs: 8.2 10*3/uL — ABNORMAL HIGH (ref 1.7–7.7)
Neutrophils Relative %: 73 %
Platelets: 193 10*3/uL (ref 150–400)
RBC: 4.04 MIL/uL — ABNORMAL LOW (ref 4.22–5.81)
RDW: 15.9 % — ABNORMAL HIGH (ref 11.5–15.5)
WBC: 11.4 10*3/uL — ABNORMAL HIGH (ref 4.0–10.5)
nRBC: 0 % (ref 0.0–0.2)

## 2022-02-14 LAB — BASIC METABOLIC PANEL
Anion gap: 7 (ref 5–15)
BUN: 18 mg/dL (ref 6–20)
CO2: 31 mmol/L (ref 22–32)
Calcium: 8.8 mg/dL — ABNORMAL LOW (ref 8.9–10.3)
Chloride: 105 mmol/L (ref 98–111)
Creatinine, Ser: 0.82 mg/dL (ref 0.61–1.24)
GFR, Estimated: >60 mL/min (ref >60)
Glucose, Bld: 105 mg/dL — ABNORMAL HIGH (ref 70–99)
Potassium: 3.2 mmol/L — ABNORMAL LOW (ref 3.5–5.1)
Sodium: 143 mmol/L (ref 135–145)

## 2022-02-14 LAB — TROPONIN I (HIGH SENSITIVITY)
Troponin I (High Sensitivity): 14 ng/L (ref <18)
Troponin I (High Sensitivity): 14 ng/L (ref <18)

## 2022-02-14 LAB — D-DIMER, QUANTITATIVE: D-Dimer, Quant: 0.58 ug/mL-FEU — ABNORMAL HIGH (ref 0.00–0.50)

## 2022-02-14 IMAGING — DX DG CHEST 1V PORT
2 series · 2 of 2 positions shown · non-contrast
Comparison: Eleven days ago

CLINICAL DATA: Shortness of breath with hemoptysis

EXAM:
PORTABLE CHEST 1 VIEW

[chest ap (1 of 2)]
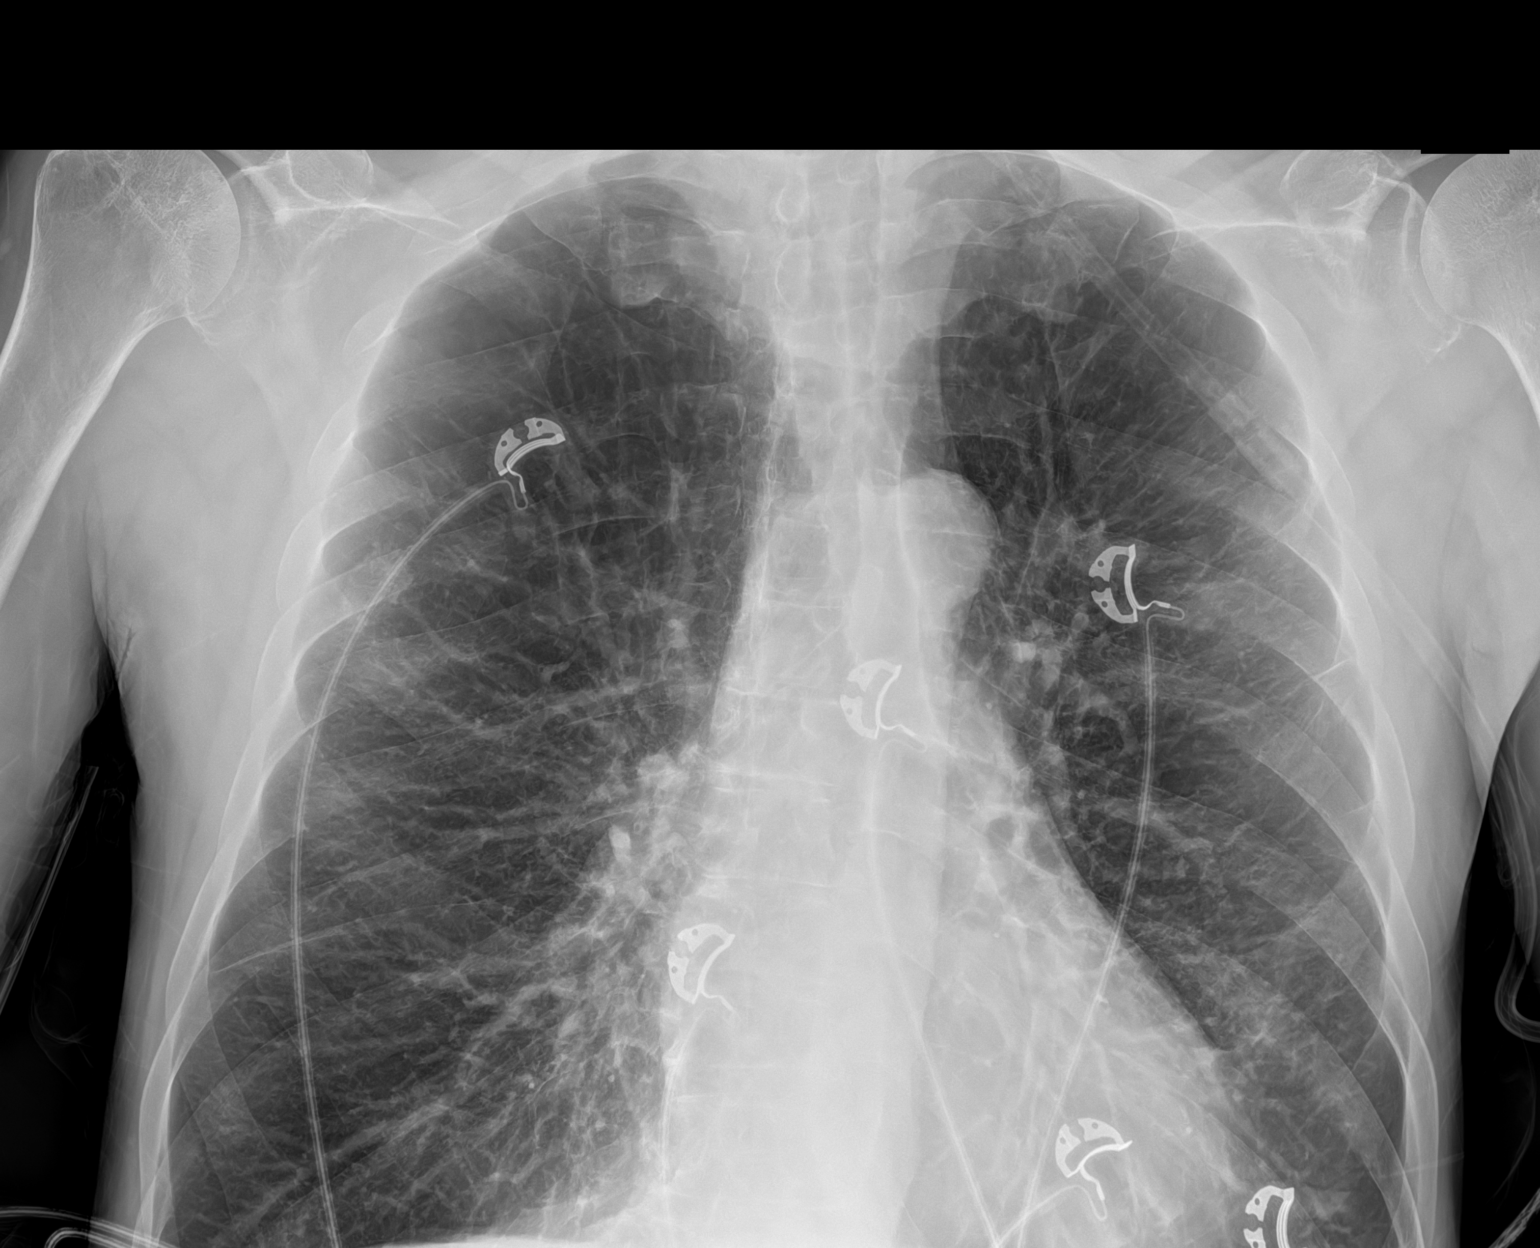

[chest ap (2 of 2)]
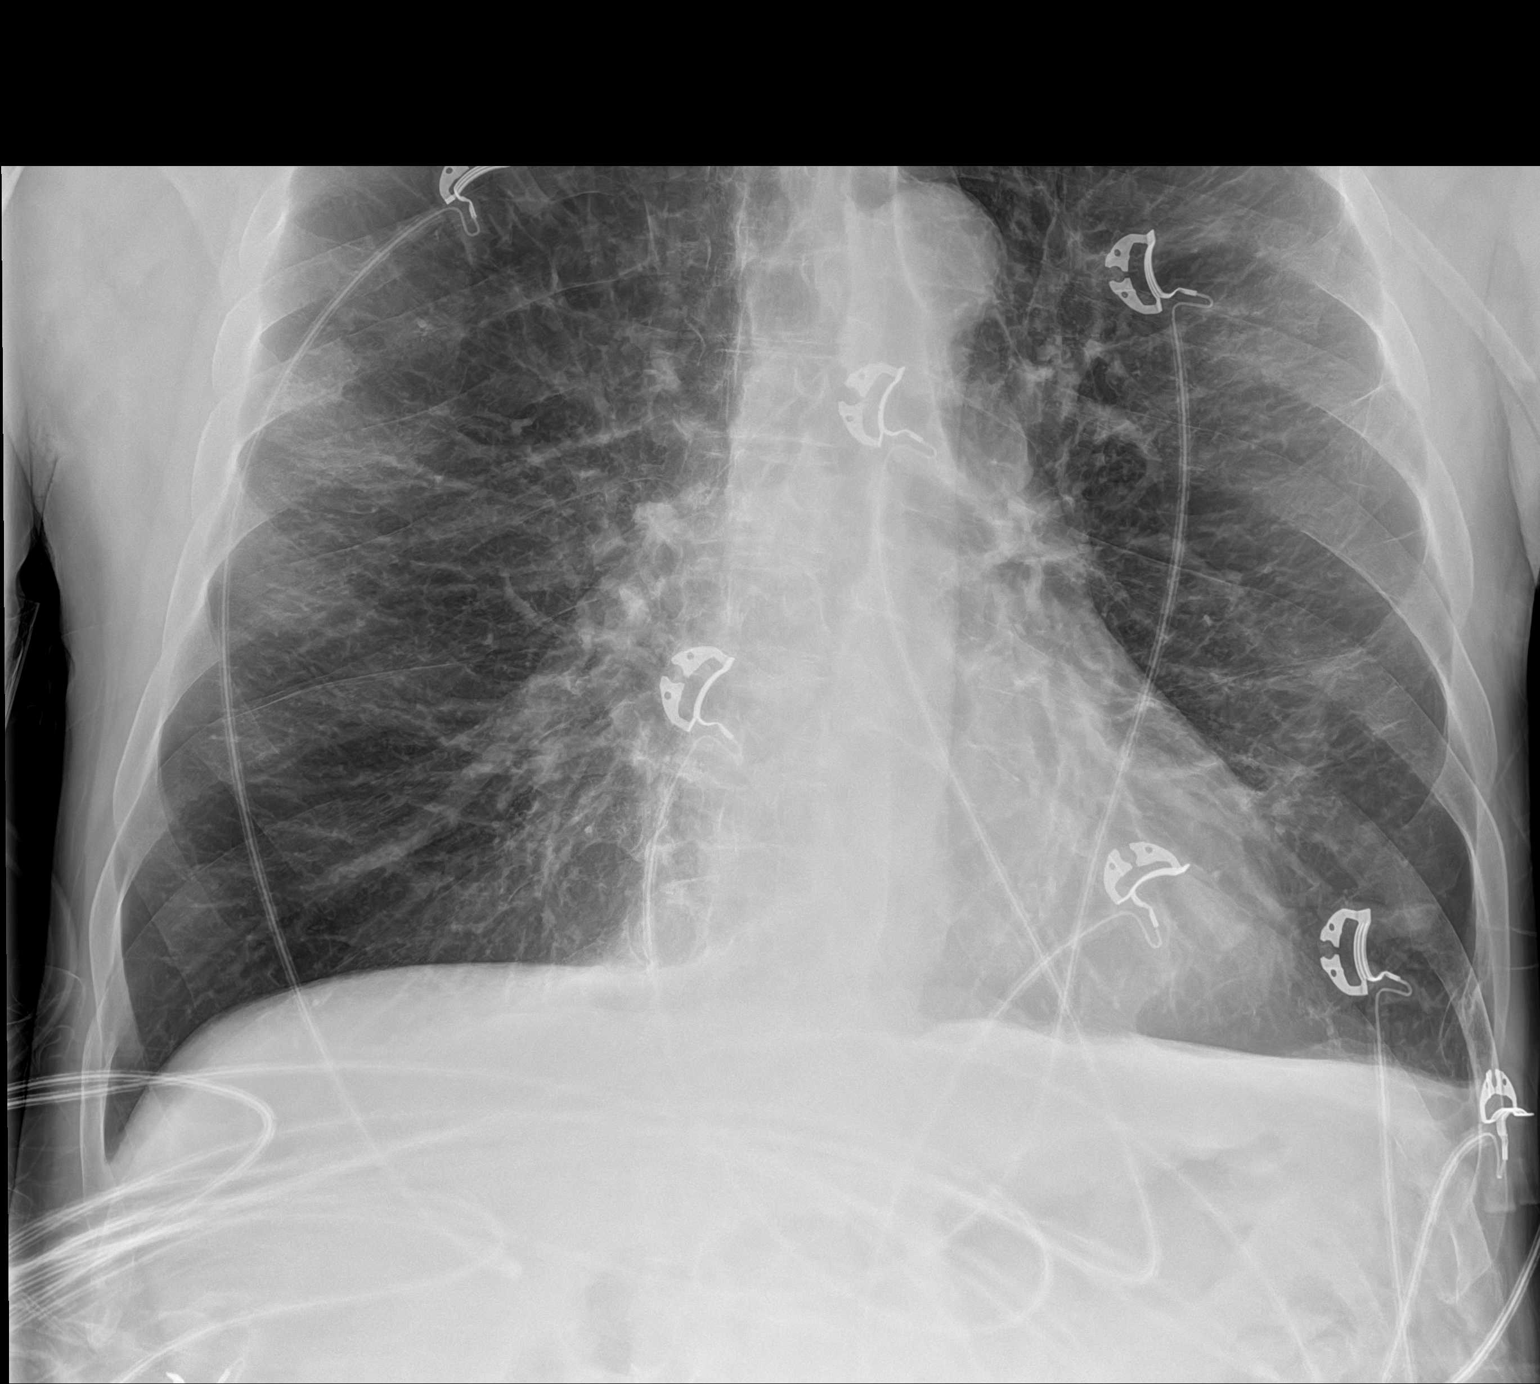

[2 of 2 positions shown; findings below may reference images not displayed]

FINDINGS: Hyperinflation and architectural distortion from emphysema.
Interstitial prominence appears stable from prior. There is no
edema, consolidation, effusion, or pneumothorax. Normal heart size
and mediastinal contours.
IMPRESSION: Baseline appearance of the chest.  Advanced emphysema.

## 2022-02-14 IMAGING — CT CT ANGIO CHEST
2 of 7 series · 16 of 36 positions shown · IV contrast (Omnipaque or Isovue)
Comparison: [DATE]

CLINICAL DATA: Pulmonary embolism suspected hemoptysis since
[REDACTED]

EXAM:
CT ANGIOGRAPHY CHEST WITH CONTRAST
TECHNIQUE: Multidetector CT imaging of the chest was performed using the
standard protocol during bolus administration of intravenous
contrast. Multiplanar CT image reconstructions and MIPs were
obtained to evaluate the vascular anatomy.

[Series 5: pe axial thins · axial · 0.81mm/px · z∈[+1231,+1507]mm · 15 of 395 slices shown]
[im 25/395  lung]
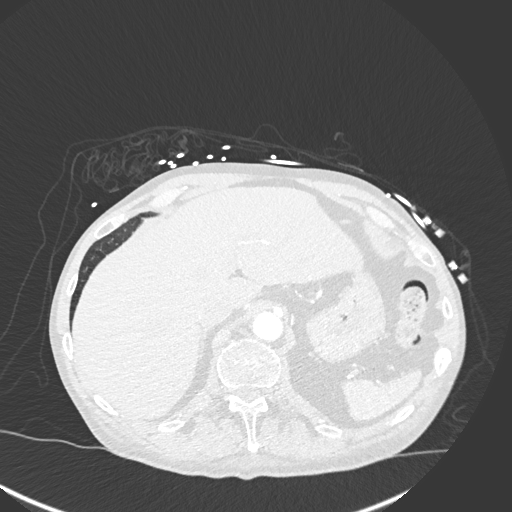
[im 50/395  mediastinal]
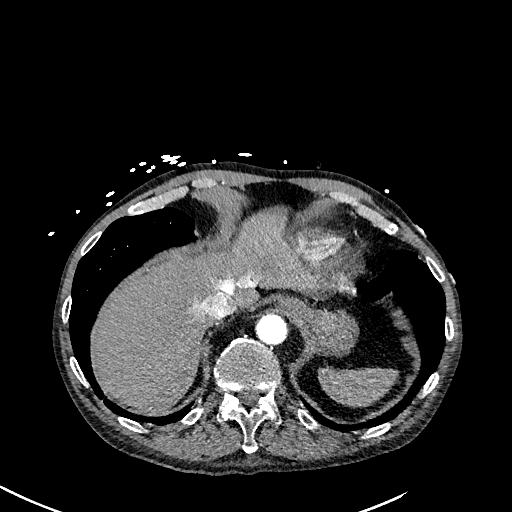
[im 74/395  lung]
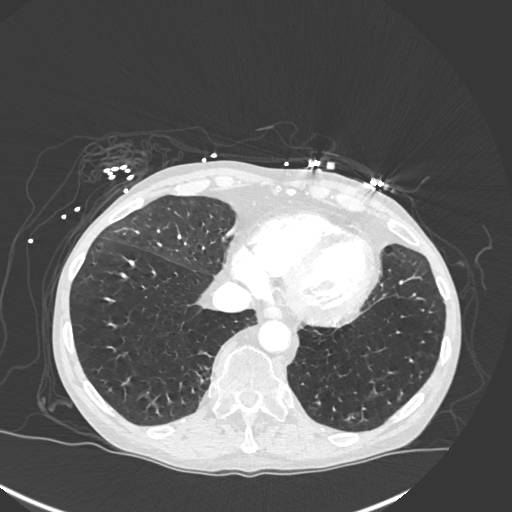
[im 99/395  mediastinal]
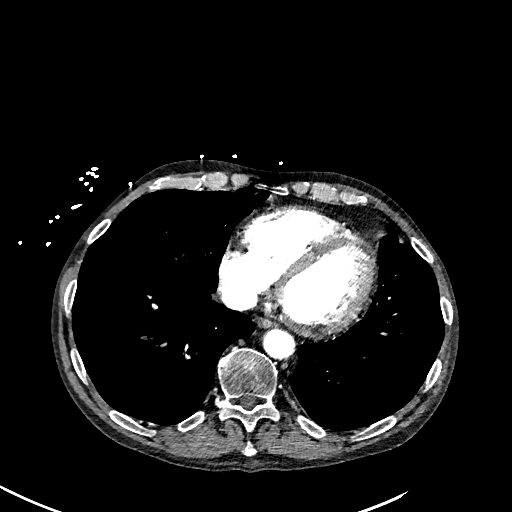
[im 124/395  lung]
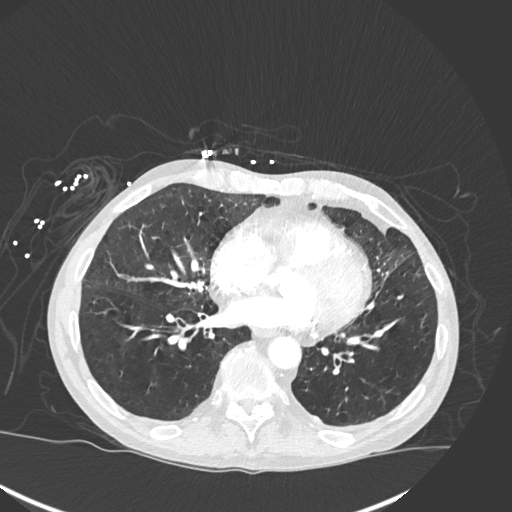
[im 148/395  mediastinal]
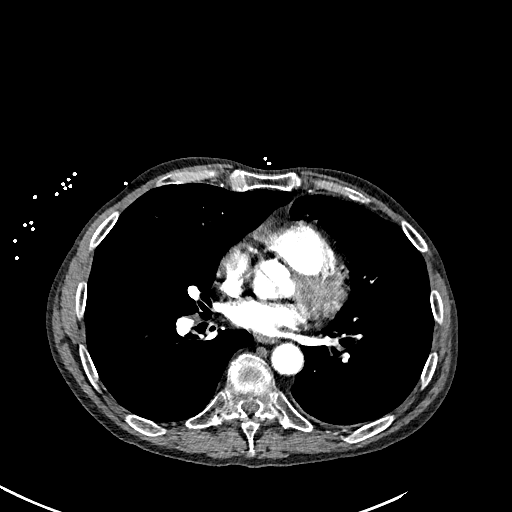
[im 173/395  lung]
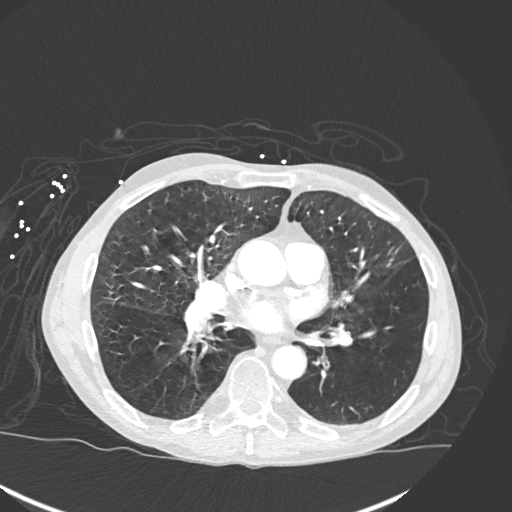
[im 198/395  mediastinal]
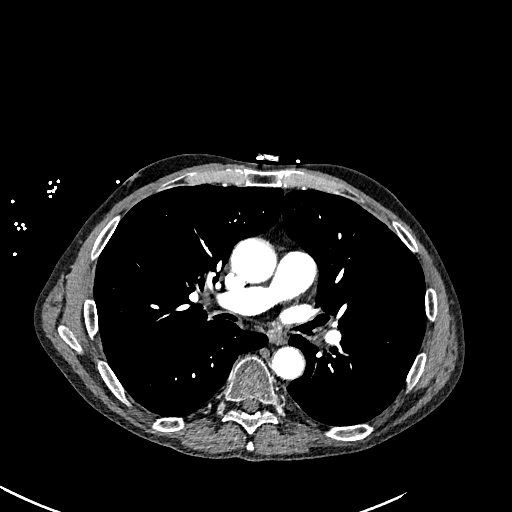
[im 222/395  lung]
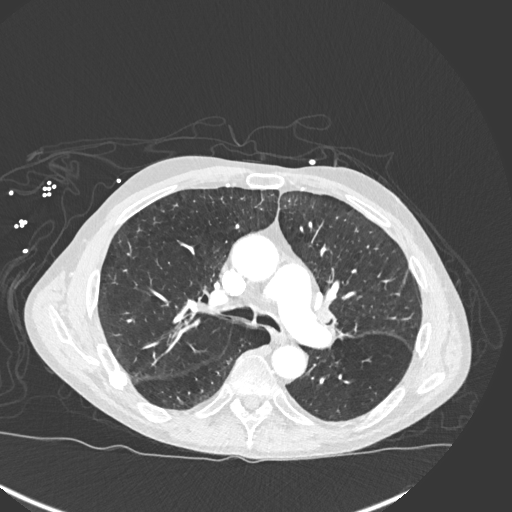
[im 247/395  mediastinal]
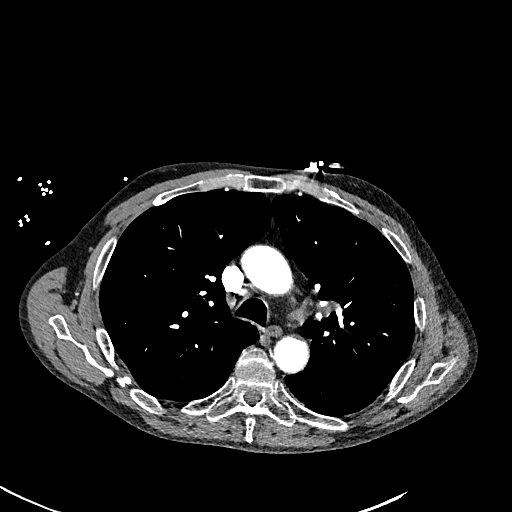
[im 271/395  lung]
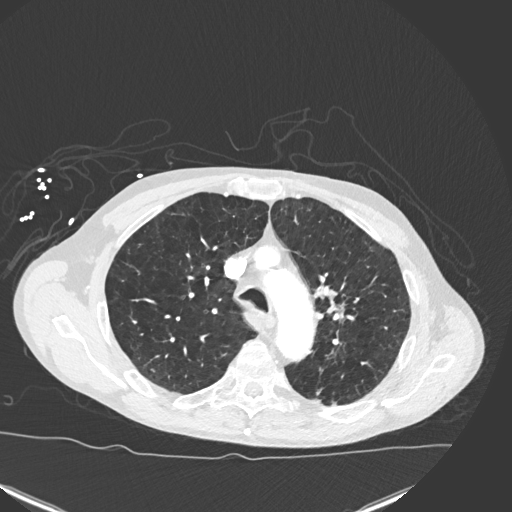
[im 296/395  mediastinal]
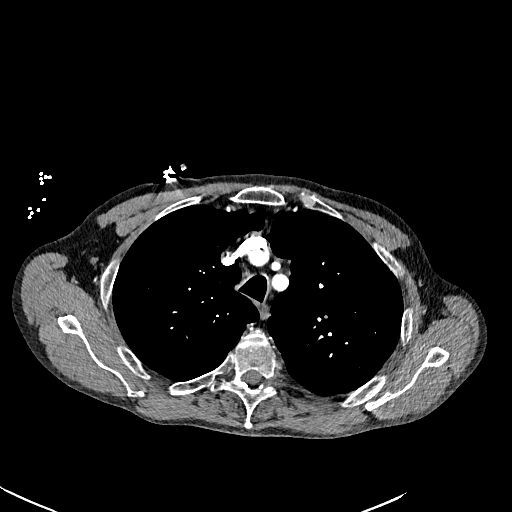
[im 321/395  lung]
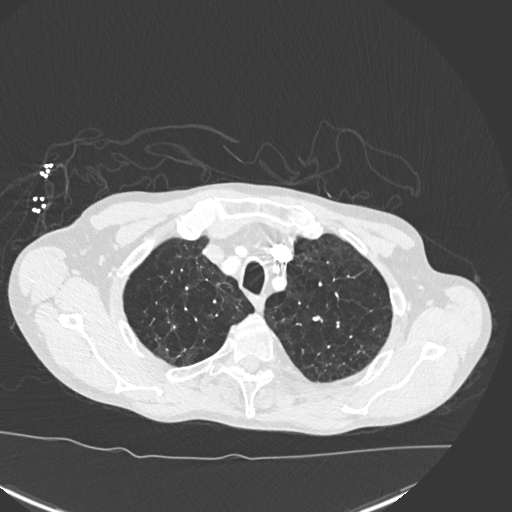
[im 345/395  mediastinal]
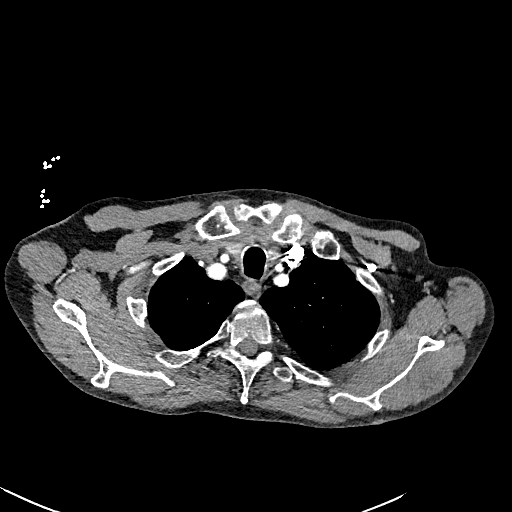
[im 370/395  lung]
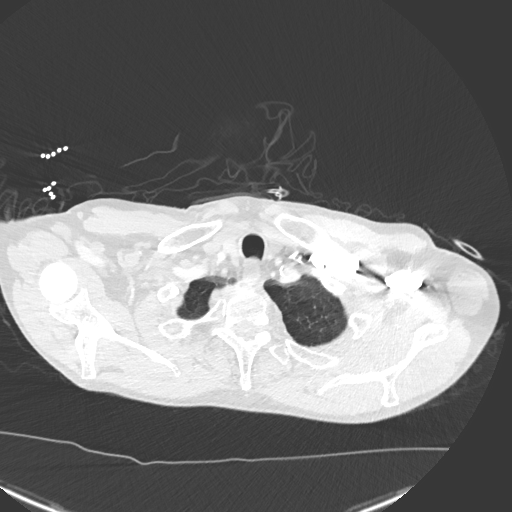

[Series 8: cor soft · coronal · 0.62mm/px · 1 of 128 slices shown]
[im 64/128  mediastinal]
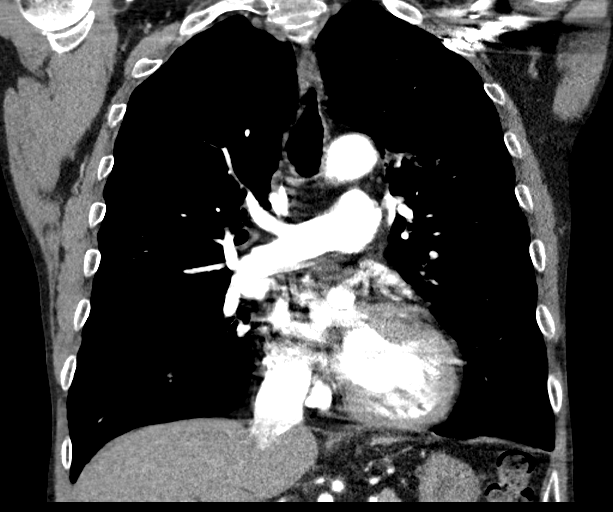

[16 of 36 positions shown; findings below may reference images not displayed]

RADIATION DOSE REDUCTION: This exam was performed according to the
departmental dose-optimization program which includes automated
exposure control, adjustment of the mA and/or kV according to
patient size and/or use of iterative reconstruction technique.

CONTRAST:  100mL OMNIPAQUE IOHEXOL 350 MG/ML SOLN
FINDINGS: Cardiovascular: Satisfactory opacification of the pulmonary arteries
to the segmental level. No evidence of pulmonary embolism when
accounting for levels of motion artifact. Normal heart size. No
pericardial effusion. Mild coronary atheromatous calcification.

Mediastinum/Nodes: Negative for adenopathy.

Lungs/Pleura: Advanced emphysema. There is airway obstruction and
opacification involving the apical segment of the left upper lobe.
No discrete enhancing mass is seen. Generalized airway thickening
with bronchomalacia. No edema, effusion, or pneumothorax. No
alveolar hemorrhage seen.

Upper Abdomen: Negative

Musculoskeletal: Small area of AVN at the right humeral head. T10,
T11, L1 remote compression fractures.

Review of the MIP images confirms the above findings.
IMPRESSION: 1. Segmental airway opacification in the left upper lobe, possibly a
localizing sign related to the hemoptysis. No discrete lesion, but
recommend imaging or bronchoscopic follow-up to document clearance.
2. Negative for pulmonary embolism. Motion artifact at the lung
bases.
3. Advanced emphysema.

## 2022-02-14 MED ORDER — DOXYCYCLINE HYCLATE 100 MG PO CAPS
100.0000 mg | ORAL_CAPSULE | Freq: Two times a day (BID) | ORAL | 0 refills | Status: AC
Start: 2022-02-14 — End: 2022-02-21

## 2022-02-14 MED ORDER — IOHEXOL 350 MG/ML SOLN
100.0000 mL | Freq: Once | INTRAVENOUS | Status: AC | PRN
  Administered 2022-02-14: 100 mL via INTRAVENOUS

## 2022-02-14 NOTE — ED Notes (Signed)
Pt upset with care. Upset that the had to get himself out of the wheelchair when he came back to the room.  ?

## 2022-02-14 NOTE — ED Provider Notes (Signed)
Blood pressure 114/77, pulse 66, temperature 97.8 ?F (36.6 ?C), temperature source Oral, resp. rate 18, height 5\' 11"  (1.803 m), weight 65 kg, SpO2 98 %. ? ?Assuming care from Dr. Betsey Holiday.  In short, Tony Sullivan is a 61 y.o. male with a chief complaint of Hemoptysis ?Marland Kitchen  Refer to the original H&P for additional details. ? ?The current plan of care is to follow up on CTA and troponin. ? ?08:40 AM  ?CTA without PE. Opacity in the LUL. Patient looking very well. He is feeling well and at his baseline home O2 settings. No large volume hemoptysis here. Spoke with his Pulmonologist, Dr. [name], who will help to arrange follow up. Will start abx.  ? ? ?  ?Margette Fast, MD ?02/14/22 [phone] ? ?

## 2022-02-14 NOTE — Discharge Instructions (Signed)
You were seen in the emergency room today with coughing up blood.  Your work-up here is reassuring.  I am starting you on antibiotics.  I spoke with Dr. [name] who would like to see you in the office to decide on follow-up evaluation.  Please call the office today to schedule a follow-up appointment. I have called in a course of antibiotics. Return with any new or worsening symptoms.  ?

## 2022-02-14 NOTE — ED Triage Notes (Signed)
Pt c/o coughing up blood since the 18th. Pt was here for same on the 18th but left without being seen.  ?Pt asking for something to eat and drink in triage.  ?

## 2022-02-14 NOTE — ED Notes (Signed)
Pt resting with eyes closed. Arousable to obtain EKG . Denies any neeeds ?

## 2022-02-14 NOTE — ED Provider Notes (Signed)
?[facility] ?Provider Note ? ? ?CSN: [id number] ?Arrival date & time: 02/13/22  2259 ? ?  ? ?History ? ?Chief Complaint  ?Patient presents with  ? Hemoptysis  ? ? ?Tony Sullivan is a 61 y.o. male. ? ?Patient presents to the emergency department for evaluation of hemoptysis.  Patient reports that he was seen in the ED 3 days ago for shortness of breath.  He has been having a lot of problems with shortness of breath recently.  He reports that the day after he was seen in the ED, however, he started coughing up blood.  Tonight he coughed up some "tissue".  He reports that after he coughed this up, however, his breathing significantly improved. ? ? ?  ? ?Home Medications ?Prior to Admission medications   ?Medication Sig Start Date End Date Taking? Authorizing Provider  ?doxycycline (VIBRAMYCIN) 100 MG capsule Take 1 capsule (100 mg total) by mouth 2 (two) times daily for 7 days. 02/14/22 02/21/22 Yes Long, Wonda Olds, MD  ?acetaminophen (TYLENOL) 325 MG tablet Take 2 tablets (650 mg total) by mouth every 6 (six) hours as needed for mild pain (or Fever >/= 101). 10/30/21   [name], MD  ?albuterol (PROVENTIL) (2.5 MG/3ML) 0.083% nebulizer solution Take 3 mLs (2.5 mg total) by nebulization every 4 (four) hours as needed for wheezing or shortness of breath. 12/12/21 12/12/22  [name], MD  ?albuterol (VENTOLIN HFA) 108 (90 Base) MCG/ACT inhaler Inhale 2 puffs into the lungs every 6 (six) hours as needed for wheezing or shortness of breath. ?Patient not taking: Reported on 02/03/2022 01/09/22   [name], DO  ?atorvastatin (LIPITOR) 40 MG tablet TAKE ONE TABLET BY MOUTH DAILY ?Patient taking differently: Take 40 mg by mouth daily. 07/06/20   [name], MD  ?benztropine (COGENTIN) 0.5 MG tablet Take 0.5 mg by mouth at bedtime.    [provider]  ?Budeson-Glycopyrrol-Formoterol (BREZTRI AEROSPHERE) 160-9-4.8 MCG/ACT AERO Inhale 2 puffs into the lungs in the morning and at bedtime.  12/12/21   [name], MD  ?cetirizine (ZYRTEC) 10 MG tablet Take 10 mg by mouth daily.    [provider]  ?dextromethorphan-guaiFENesin (MUCINEX DM) 30-600 MG 12hr tablet Take 1 tablet by mouth 2 (two) times daily as needed for cough. 01/13/22   [name], MD  ?diclofenac Sodium (VOLTAREN) 1 % GEL Apply 2 g topically 4 (four) times daily.    [provider]  ?dicyclomine (BENTYL) 20 MG tablet Take 1 tablet (20 mg total) by mouth 2 (two) times daily as needed for spasms. 12/15/21   [name], PA-C  ?dorzolamide (TRUSOPT) 2 % ophthalmic solution Place 1 drop into the left eye 2 (two) times daily. 12/12/20   [provider]  ?hydrochlorothiazide (HYDRODIURIL) 25 MG tablet Take 0.5 tablets (12.5 mg total) by mouth daily. 10/30/21   [name], MD  ?hydrOXYzine (ATARAX) 25 MG tablet Take 1 tablet (25 mg total) by mouth 3 (three) times daily as needed for anxiety or nausea. 10/30/21   [name], MD  ?latanoprost (XALATAN) 0.005 % ophthalmic solution Place 1 drop into both eyes at bedtime. 05/24/19   [provider]  ?loxapine (LOXITANE) 10 MG capsule Take 10 mg by mouth at bedtime.     [provider]  ?megestrol (MEGACE) 40 MG tablet Take 40 mg by mouth daily.    [provider]  ?mirtazapine (REMERON) 30 MG tablet Take 1 tablet (30 mg total) by mouth  at bedtime. 10/30/21   [name], MD  ?Multiple Vitamin (MULTIVITAMIN WITH MINERALS) TABS tablet Take 1 tablet by mouth daily. 10/31/21   [name], MD  ?OXYGEN Inhale 3-4 L into the lungs as directed. 3-4 liters with sleep and exertion as needed for low oxygen    [provider]  ?pantoprazole (PROTONIX) 40 MG tablet Take 1 tablet (40 mg total) by mouth daily. 10/30/21   [name], MD  ?polyethylene glycol (MIRALAX / GLYCOLAX) 17 g packet TAKE 17 GRAMS BY MOUTH DAILY ?Patient taking differently: Take 17 g by mouth daily as needed for mild constipation. 03/09/20   [name], MD  ?predniSONE (DELTASONE) 10 MG tablet Take 2 tablets (20 mg total) by mouth daily. 02/03/22   [name], MD  ?risperiDONE (RISPERDAL) 2 MG tablet Take 1 tablet (2 mg total) by mouth at bedtime. 10/30/21   [name], MD  ?senna-docusate (SENOKOT-S) 8.6-50 MG tablet Take 2 tablets by mouth at bedtime. ?Patient not taking: Reported on 02/03/2022 10/30/21   [name], MD  ?simethicone (MYLICON) 40 PP/5.0DT drops Take 0.6 mLs (40 mg total) by mouth 4 (four) times daily as needed for flatulence. ?Patient not taking: Reported on 02/03/2022 10/17/21   Orpah Greek, MD  ?simethicone (MYLICON) 80 MG chewable tablet Chew 1 tablet (80 mg total) by mouth 4 (four) times daily as needed for flatulence. ?Patient not taking: Reported on 02/03/2022 01/09/22   [name], DO  ?   ? ?Allergies    ?Penicillins and Levocetirizine   ? ?Review of Systems   ?Review of Systems  ?Respiratory:  Positive for cough and shortness of breath.   ? ?Physical Exam ?Updated Vital Signs ?BP 128/81   Pulse 62   Temp 98.4 ?F (36.9 ?C) (Oral)   Resp 17   Ht 5\' 11"  (1.803 m)   Wt 65 kg   SpO2 98%   BMI 19.99 kg/m?  ?Physical Exam ?Vitals and nursing note reviewed.  ?Constitutional:   ?   General: He is not in acute distress. ?   Appearance: He is well-developed.  ?HENT:  ?   Head: Normocephalic and atraumatic.  ?   Mouth/Throat:  ?   Mouth: Mucous membranes are moist.  ?Eyes:  ?   General: Vision grossly intact. Gaze aligned appropriately.  ?   Extraocular Movements: Extraocular movements intact.  ?   Conjunctiva/sclera: Conjunctivae normal.  ?Cardiovascular:  ?   Rate and Rhythm: Normal rate and regular rhythm.  ?   Pulses: Normal pulses.  ?   Heart sounds: Normal heart sounds, S1 normal and S2 normal. No murmur heard. ?  No friction rub. No gallop.  ?Pulmonary:  ?   Effort: Pulmonary effort is normal. No respiratory distress.  ?   Breath sounds: Wheezing present.  ?Abdominal:  ?   Palpations: Abdomen is soft.  ?    Tenderness: There is no abdominal tenderness. There is no guarding or rebound.  ?   Hernia: No hernia is present.  ?Musculoskeletal:     ?   General: No swelling.  ?   Cervical back: Full passive range of motion without pain, normal range of motion and neck supple. No pain with movement, spinous process tenderness or muscular tenderness. Normal range of motion.  ?   Right lower leg: No edema.  ?   Left lower leg: No edema.  ?Skin: ?   General: Skin is warm and dry.  ?   Capillary Refill: Capillary refill  takes less than 2 seconds.  ?   Findings: No ecchymosis, erythema, lesion or wound.  ?Neurological:  ?   Mental Status: He is alert and oriented to person, place, and time.  ?   GCS: GCS eye subscore is 4. GCS verbal subscore is 5. GCS motor subscore is 6.  ?   Cranial Nerves: Cranial nerves 2-12 are intact.  ?   Sensory: Sensation is intact.  ?   Motor: Motor function is intact. No weakness or abnormal muscle tone.  ?   Coordination: Coordination is intact.  ?Psychiatric:     ?   Mood and Affect: Mood normal.     ?   Speech: Speech normal.     ?   Behavior: Behavior normal.  ? ? ?ED Results / Procedures / Treatments   ?Labs ?(all labs ordered are listed, but only abnormal results are displayed) ?Labs Reviewed  ?D-DIMER, QUANTITATIVE - Abnormal; Notable for the following components:  ?    Result Value  ? D-Dimer, Quant 0.58 (*)   ? All other components within normal limits  ?CBC WITH DIFFERENTIAL/PLATELET - Abnormal; Notable for the following components:  ? WBC 11.4 (*)   ? RBC 4.04 (*)   ? MCV 101.5 (*)   ? RDW 15.9 (*)   ? Neutro Abs 8.2 (*)   ? Abs Immature Granulocytes 0.27 (*)   ? All other components within normal limits  ?BASIC METABOLIC PANEL - Abnormal; Notable for the following components:  ? Potassium 3.2 (*)   ? Glucose, Bld 105 (*)   ? Calcium 8.8 (*)   ? All other components within normal limits  ?TROPONIN I (HIGH SENSITIVITY)  ?TROPONIN I (HIGH SENSITIVITY)  ? ? ?EKG ?EKG  Interpretation ? ?Date/Time:  Thursday February 14 2022 05:28:50 EDT ?Ventricular Rate:  72 ?PR Interval:  159 ?QRS Duration: 86 ?QT Interval:  389 ?QTC Calculation: 426 ?R Axis:   265 ?Text Interpretation: Sinus rhythm Atrial premature complex

## 2022-02-19 ENCOUNTER — Encounter (HOSPITAL_COMMUNITY): Payer: Self-pay

## 2022-02-19 ENCOUNTER — Emergency Department (HOSPITAL_COMMUNITY): Payer: Medicaid Other

## 2022-02-19 ENCOUNTER — Inpatient Hospital Stay (HOSPITAL_COMMUNITY)
Admission: EM | Admit: 2022-02-19 | Discharge: 2022-02-21 | DRG: 190 | Disposition: A | Discharge status: Home / Self Care | Payer: Medicaid Other | Attending: Family Medicine | Admitting: Family Medicine

## 2022-02-19 ENCOUNTER — Other Ambulatory Visit: Payer: Self-pay

## 2022-02-19 DIAGNOSIS — J189 Pneumonia, unspecified organism: Secondary | ICD-10-CM | POA: Diagnosis not present

## 2022-02-19 DIAGNOSIS — Z888 Allergy status to other drugs, medicaments and biological substances status: Secondary | ICD-10-CM

## 2022-02-19 DIAGNOSIS — Z7952 Long term (current) use of systemic steroids: Secondary | ICD-10-CM

## 2022-02-19 DIAGNOSIS — I10 Essential (primary) hypertension: Secondary | ICD-10-CM | POA: Diagnosis not present

## 2022-02-19 DIAGNOSIS — Z720 Tobacco use: Secondary | ICD-10-CM | POA: Diagnosis present

## 2022-02-19 DIAGNOSIS — Z87892 Personal history of anaphylaxis: Secondary | ICD-10-CM

## 2022-02-19 DIAGNOSIS — J441 Chronic obstructive pulmonary disease with (acute) exacerbation: Principal | ICD-10-CM

## 2022-02-19 DIAGNOSIS — Z880 Allergy status to penicillin: Secondary | ICD-10-CM

## 2022-02-19 DIAGNOSIS — Z7951 Long term (current) use of inhaled steroids: Secondary | ICD-10-CM

## 2022-02-19 DIAGNOSIS — R718 Other abnormality of red blood cells: Secondary | ICD-10-CM | POA: Diagnosis not present

## 2022-02-19 DIAGNOSIS — M1990 Unspecified osteoarthritis, unspecified site: Secondary | ICD-10-CM | POA: Diagnosis present

## 2022-02-19 DIAGNOSIS — K219 Gastro-esophageal reflux disease without esophagitis: Secondary | ICD-10-CM | POA: Diagnosis present

## 2022-02-19 DIAGNOSIS — Z825 Family history of asthma and other chronic lower respiratory diseases: Secondary | ICD-10-CM

## 2022-02-19 DIAGNOSIS — D72829 Elevated white blood cell count, unspecified: Secondary | ICD-10-CM | POA: Diagnosis present

## 2022-02-19 DIAGNOSIS — J440 Chronic obstructive pulmonary disease with acute lower respiratory infection: Secondary | ICD-10-CM | POA: Diagnosis present

## 2022-02-19 DIAGNOSIS — H409 Unspecified glaucoma: Secondary | ICD-10-CM | POA: Diagnosis present

## 2022-02-19 DIAGNOSIS — F17200 Nicotine dependence, unspecified, uncomplicated: Secondary | ICD-10-CM | POA: Diagnosis present

## 2022-02-19 DIAGNOSIS — R7303 Prediabetes: Secondary | ICD-10-CM | POA: Diagnosis present

## 2022-02-19 DIAGNOSIS — Z981 Arthrodesis status: Secondary | ICD-10-CM

## 2022-02-19 DIAGNOSIS — R600 Localized edema: Secondary | ICD-10-CM

## 2022-02-19 DIAGNOSIS — Z9981 Dependence on supplemental oxygen: Secondary | ICD-10-CM

## 2022-02-19 DIAGNOSIS — E782 Mixed hyperlipidemia: Secondary | ICD-10-CM | POA: Diagnosis present

## 2022-02-19 DIAGNOSIS — Z79899 Other long term (current) drug therapy: Secondary | ICD-10-CM

## 2022-02-19 LAB — BASIC METABOLIC PANEL
Anion gap: 7 (ref 5–15)
BUN: 20 mg/dL (ref 6–20)
CO2: 35 mmol/L — ABNORMAL HIGH (ref 22–32)
Calcium: 9.1 mg/dL (ref 8.9–10.3)
Chloride: 102 mmol/L (ref 98–111)
Creatinine, Ser: 0.86 mg/dL (ref 0.61–1.24)
GFR, Estimated: >60 mL/min (ref >60)
Glucose, Bld: 91 mg/dL (ref 70–99)
Potassium: 3.5 mmol/L (ref 3.5–5.1)
Sodium: 144 mmol/L (ref 135–145)

## 2022-02-19 LAB — CBC
HCT: 46.5 % (ref 39.0–52.0)
Hemoglobin: 14.3 g/dL (ref 13.0–17.0)
MCH: 31.4 pg (ref 26.0–34.0)
MCHC: 30.8 g/dL (ref 30.0–36.0)
MCV: 102 fL — ABNORMAL HIGH (ref 80.0–100.0)
Platelets: 226 10*3/uL (ref 150–400)
RBC: 4.56 MIL/uL (ref 4.22–5.81)
RDW: 15.3 % (ref 11.5–15.5)
WBC: 11.9 10*3/uL — ABNORMAL HIGH (ref 4.0–10.5)
nRBC: 0 % (ref 0.0–0.2)

## 2022-02-19 LAB — BRAIN NATRIURETIC PEPTIDE: B Natriuretic Peptide: 72 pg/mL (ref 0.0–100.0)

## 2022-02-19 LAB — TROPONIN I (HIGH SENSITIVITY)
Troponin I (High Sensitivity): 16 ng/L (ref <18)
Troponin I (High Sensitivity): 14 ng/L (ref <18)

## 2022-02-19 IMAGING — DX DG CHEST 2V
3 series · 3 of 3 positions shown · non-contrast
Comparison: [DATE].

CLINICAL DATA: Chest Pain

EXAM:
CHEST - 2 VIEW

[chest lat]
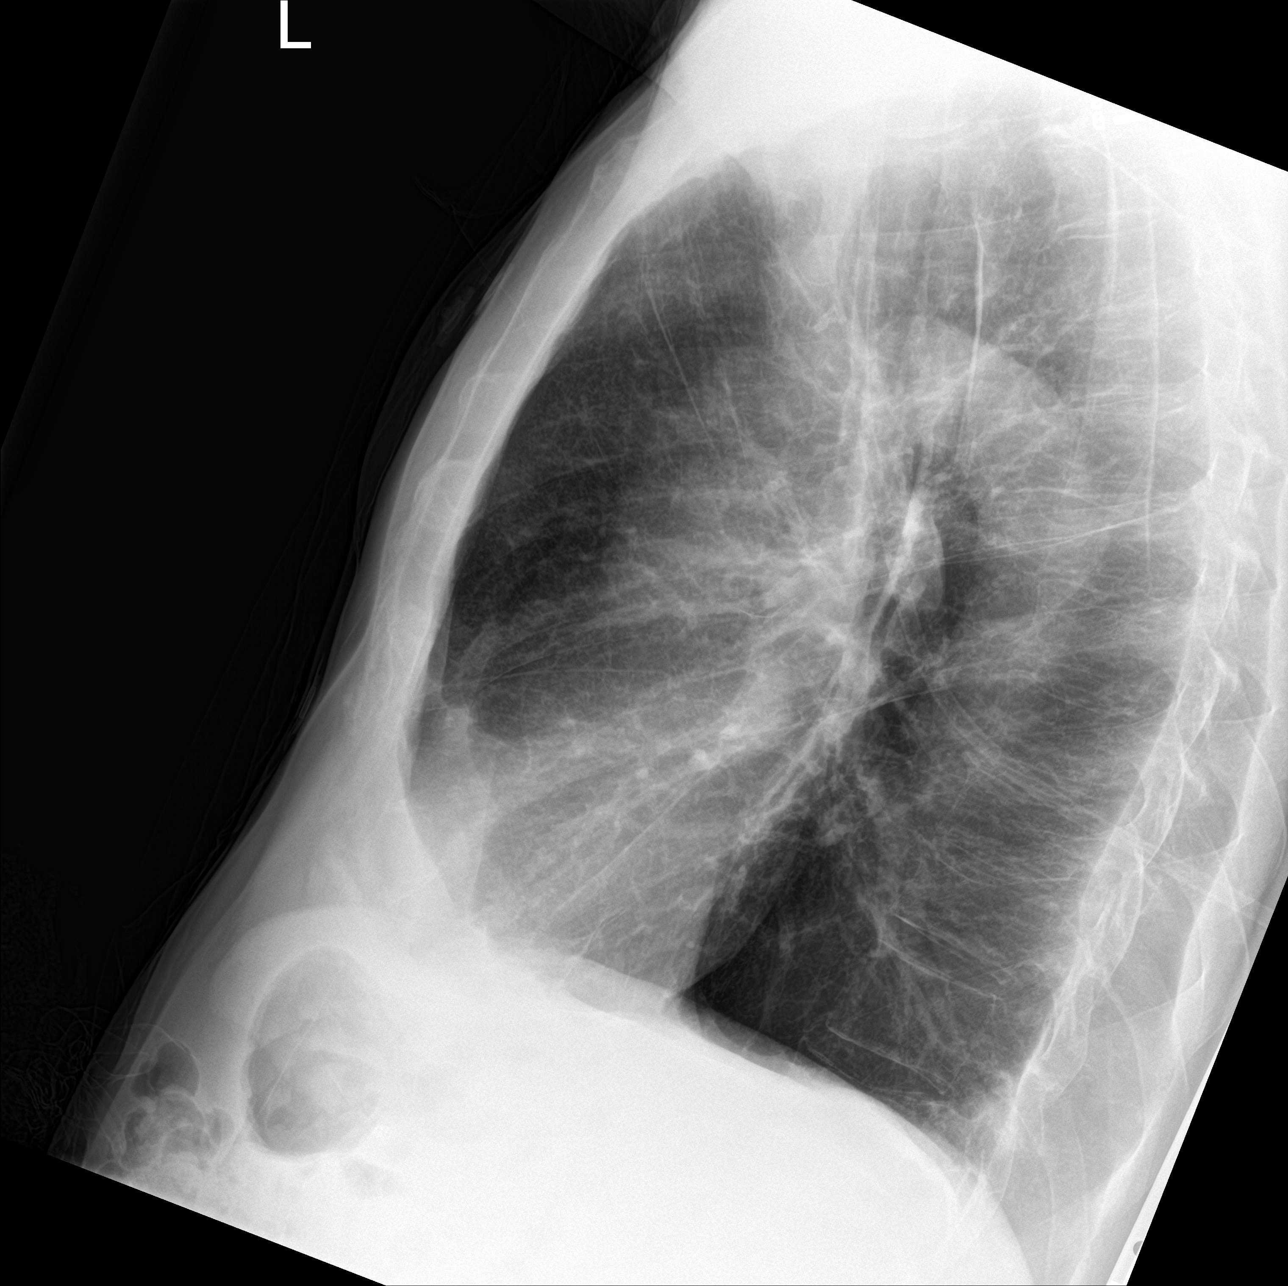

[chest ap (1 of 2)]
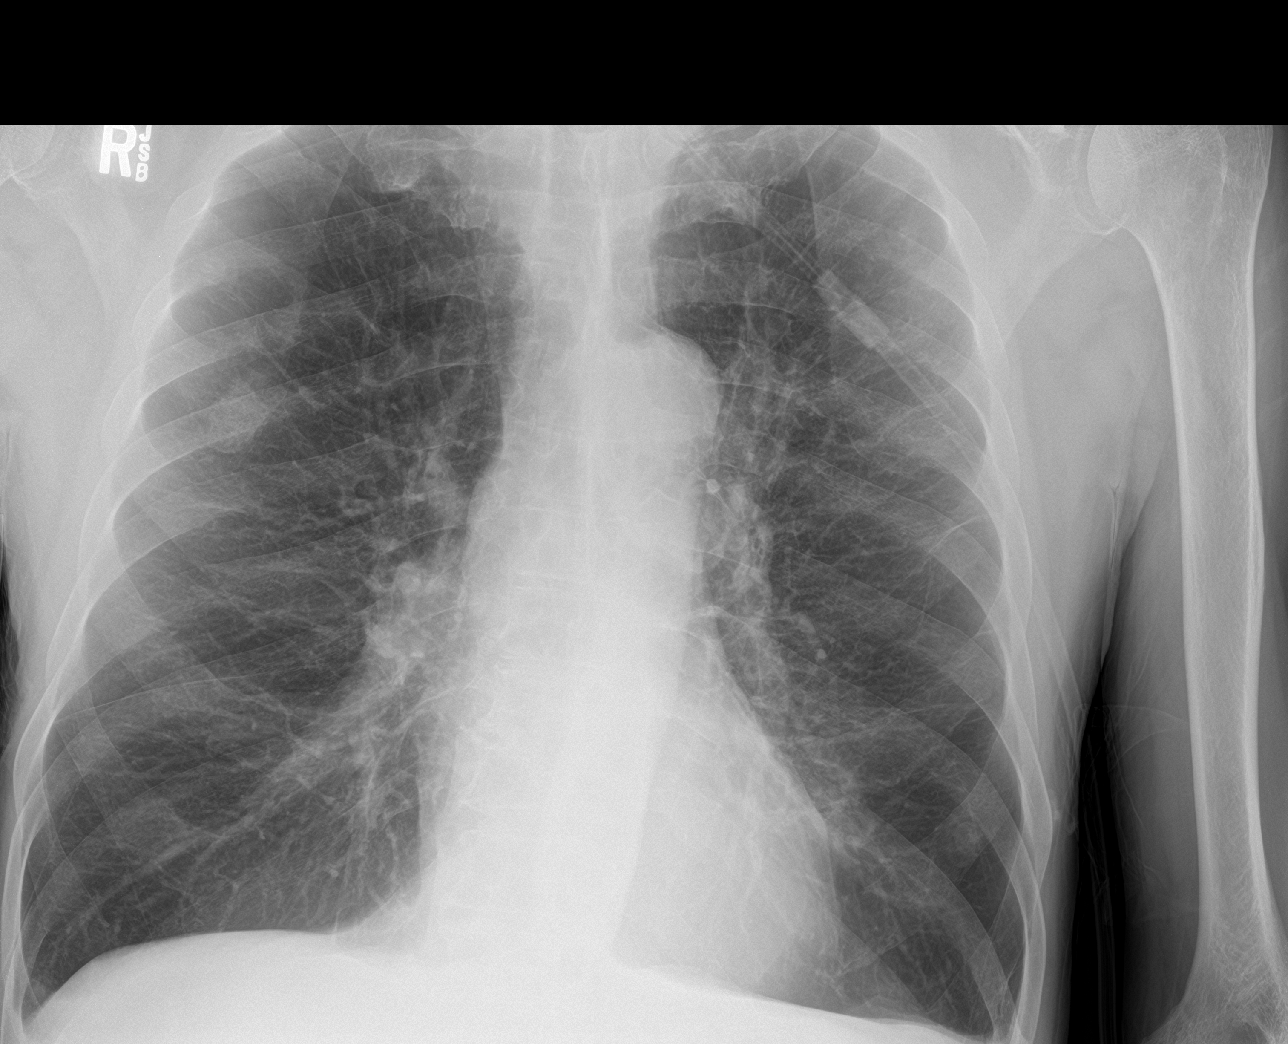

[chest ap (2 of 2)]
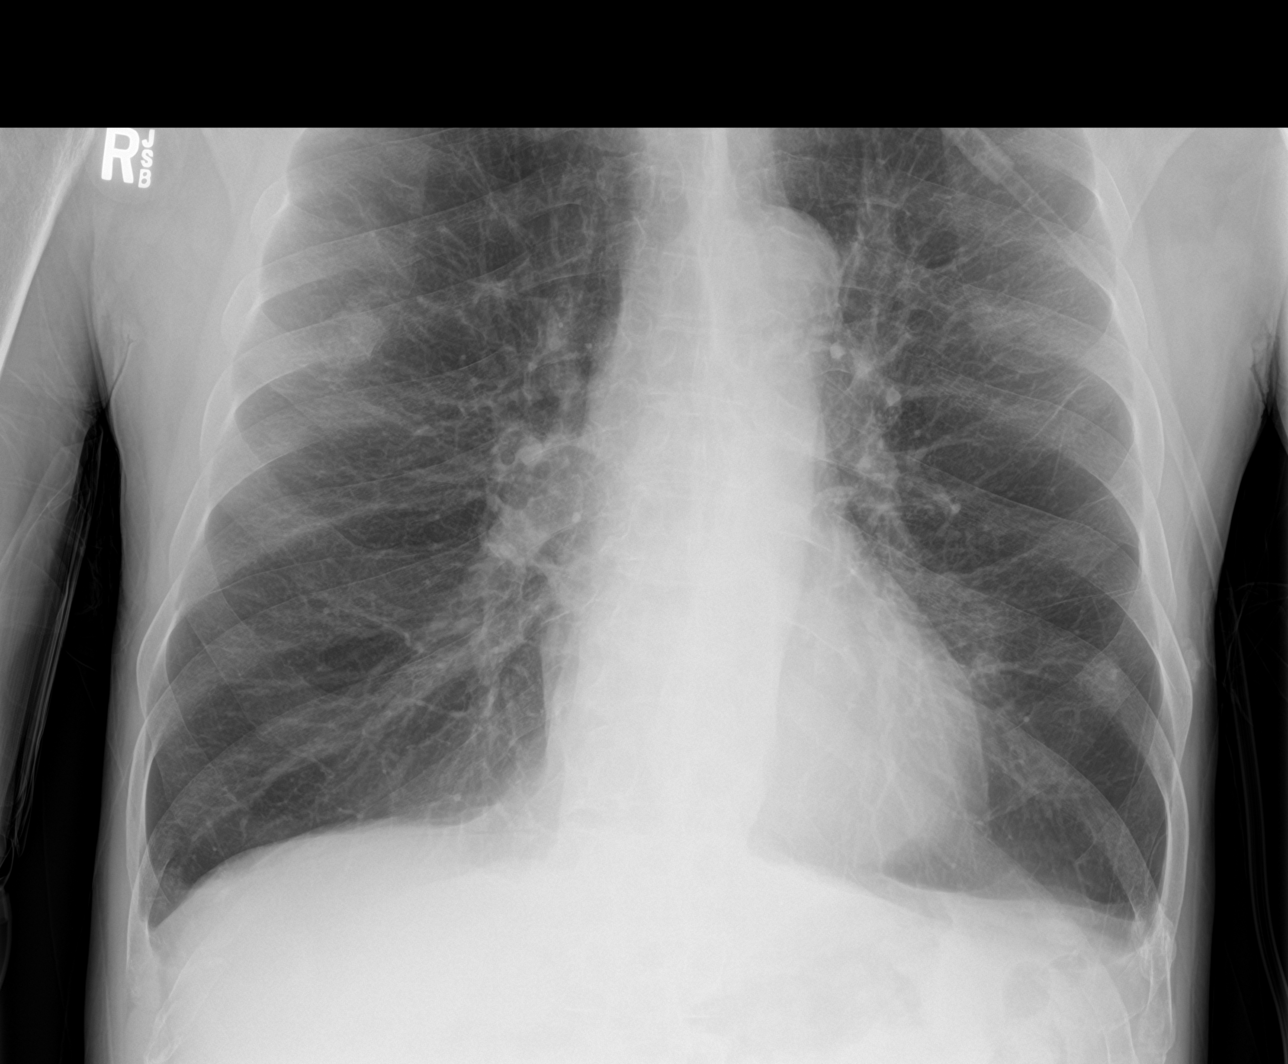

[3 of 3 positions shown; findings below may reference images not displayed]

FINDINGS: No consolidation. No visible pleural effusions or pneumothorax.
Emphysema. Remote right rib fracture. Cardiomediastinal silhouette
is within normal limits.
IMPRESSION: 1. No evidence of acute cardiopulmonary disease.
2.  Emphysema ([8P]-[8P]).

## 2022-02-19 MED ORDER — IPRATROPIUM-ALBUTEROL 0.5-2.5 (3) MG/3ML IN SOLN
3.0000 mL | Freq: Four times a day (QID) | RESPIRATORY_TRACT | Status: DC
  Administered 2022-02-20 – 2022-02-21 (×5): 3 mL via RESPIRATORY_TRACT
  Filled 2022-02-19 (×5): qty 3

## 2022-02-19 MED ORDER — ALBUTEROL SULFATE (2.5 MG/3ML) 0.083% IN NEBU
2.5000 mg | INHALATION_SOLUTION | Freq: Once | RESPIRATORY_TRACT | Status: AC
  Administered 2022-02-19: 2.5 mg via RESPIRATORY_TRACT
  Filled 2022-02-19: qty 3

## 2022-02-19 MED ORDER — METHYLPREDNISOLONE SODIUM SUCC 40 MG IJ SOLR
40.0000 mg | Freq: Two times a day (BID) | INTRAMUSCULAR | Status: DC
Start: 2022-02-20 — End: 2022-02-21
  Administered 2022-02-20 – 2022-02-21 (×3): 40 mg via INTRAVENOUS
  Filled 2022-02-19 (×3): qty 1

## 2022-02-19 MED ORDER — LIDOCAINE 5 % EX PTCH
1.0000 | MEDICATED_PATCH | Freq: Once | CUTANEOUS | Status: AC
Start: 2022-02-19 — End: 2022-02-20
  Administered 2022-02-19: 1 via TRANSDERMAL
  Filled 2022-02-19: qty 1

## 2022-02-19 MED ORDER — ACETAMINOPHEN 325 MG PO TABS
650.0000 mg | ORAL_TABLET | Freq: Four times a day (QID) | ORAL | Status: DC | PRN
  Administered 2022-02-19 – 2022-02-20 (×2): 650 mg via ORAL
  Filled 2022-02-19 (×2): qty 2

## 2022-02-19 MED ORDER — PANTOPRAZOLE SODIUM 40 MG PO TBEC
40.0000 mg | DELAYED_RELEASE_TABLET | Freq: Every day | ORAL | Status: DC
  Administered 2022-02-20 – 2022-02-21 (×2): 40 mg via ORAL
  Filled 2022-02-19 (×2): qty 1

## 2022-02-19 MED ORDER — HYDROCOD POLI-CHLORPHE POLI ER 10-8 MG/5ML PO SUER: 5.0000 mL | Freq: Two times a day (BID) | ORAL | Status: DC | PRN

## 2022-02-19 MED ORDER — GUAIFENESIN-DM 100-10 MG/5ML PO SYRP
5.0000 mL | ORAL_SOLUTION | ORAL | Status: DC | PRN
Start: 2022-02-19 — End: 2022-02-20

## 2022-02-19 MED ORDER — PREDNISONE 20 MG PO TABS
20.0000 mg | ORAL_TABLET | Freq: Once | ORAL | Status: AC
  Administered 2022-02-19: 20 mg via ORAL
  Filled 2022-02-19: qty 1

## 2022-02-19 MED ORDER — IPRATROPIUM-ALBUTEROL 0.5-2.5 (3) MG/3ML IN SOLN: 3.0000 mL | RESPIRATORY_TRACT | Status: DC | PRN

## 2022-02-19 MED ORDER — DOXYCYCLINE HYCLATE 100 MG PO TABS
100.0000 mg | ORAL_TABLET | Freq: Two times a day (BID) | ORAL | Status: DC
  Administered 2022-02-19 – 2022-02-20 (×2): 100 mg via ORAL
  Filled 2022-02-19 (×2): qty 1

## 2022-02-19 MED ORDER — DM-GUAIFENESIN ER 30-600 MG PO TB12
1.0000 | ORAL_TABLET | Freq: Two times a day (BID) | ORAL | Status: DC
  Administered 2022-02-19: 1 via ORAL
  Filled 2022-02-19: qty 1

## 2022-02-19 MED ORDER — CHLORHEXIDINE GLUCONATE 0.12 % MT SOLN
15.0000 mL | Freq: Two times a day (BID) | OROMUCOSAL | Status: DC
  Administered 2022-02-19 – 2022-02-21 (×4): 15 mL via OROMUCOSAL
  Filled 2022-02-19 (×4): qty 15

## 2022-02-19 MED ORDER — ORAL CARE MOUTH RINSE
15.0000 mL | Freq: Two times a day (BID) | OROMUCOSAL | Status: DC
  Administered 2022-02-20 (×2): 15 mL via OROMUCOSAL

## 2022-02-19 MED ORDER — ENOXAPARIN SODIUM 40 MG/0.4ML IJ SOSY
40.0000 mg | PREFILLED_SYRINGE | INTRAMUSCULAR | Status: DC
  Administered 2022-02-19 – 2022-02-20 (×2): 40 mg via SUBCUTANEOUS
  Filled 2022-02-19 (×2): qty 0.4

## 2022-02-19 MED ORDER — METHYLPREDNISOLONE SODIUM SUCC 40 MG IJ SOLR
40.0000 mg | Freq: Two times a day (BID) | INTRAMUSCULAR | Status: DC
Start: 2022-02-19 — End: 2022-02-19

## 2022-02-19 MED ORDER — MOMETASONE FURO-FORMOTEROL FUM 200-5 MCG/ACT IN AERO
2.0000 | INHALATION_SPRAY | Freq: Two times a day (BID) | RESPIRATORY_TRACT | Status: DC
  Administered 2022-02-19 – 2022-02-21 (×4): 2 via RESPIRATORY_TRACT
  Filled 2022-02-19: qty 8.8

## 2022-02-19 NOTE — ED Notes (Signed)
Pt given a Sprite and saltine crackers x3.  ?

## 2022-02-19 NOTE — ED Provider Notes (Signed)
?Tony Sullivan ?Provider Note ? ? ?CSN: [id number] ?Arrival date & time: 02/19/22  6073 ? ?  ? ?History ? ?Chief Complaint  ?Patient presents with  ? Chest Pain  ? ? ?Tony Sullivan is a 61 y.o. male. ? ? ?Chest Pain ?Associated symptoms: cough and shortness of breath   ?Associated symptoms: no abdominal pain, no dizziness, no fever, no headache, no nausea, no numbness, no vomiting and no weakness   ? ?  ? ? ?Tony Sullivan is a 61 y.o. male with past medical history of COPD, hypertension, chronic respiratory failure with hypoxia, he is on supplemental oxygen at 3 L/min continuously, who presents to the Emergency Department complaining of chest tightness, productive cough and wheezing.  He is followed by Dr. [name] with pulmonology.  He was seen here on 02/14/2022 and had a opacity on chest x-ray of the left upper lobe.  He was started on doxycycline.  He takes prednisone 20 mg 2 times a day.  He states that he accidentally dropped his prednisone tablets in the toilet.  This occurred 3 days ago he has not had any steroids since that time.  He had some hemoptysis on his previous ER visit, but states that has somewhat improved.  He denies any fever, chills, abdominal pain or vomiting.  He has some swelling of his bilateral ankles but states this is similar to baseline.  No documented history of CHF.  Last used his albuterol neb yesterday. ? ? ? ?Home Medications ?Prior to Admission medications   ?Medication Sig Start Date End Date Taking? Authorizing Provider  ?acetaminophen (TYLENOL) 325 MG tablet Take 2 tablets (650 mg total) by mouth every 6 (six) hours as needed for mild pain (or Fever >/= 101). 10/30/21   [name], MD  ?albuterol (PROVENTIL) (2.5 MG/3ML) 0.083% nebulizer solution Take 3 mLs (2.5 mg total) by nebulization every 4 (four) hours as needed for wheezing or shortness of breath. 12/12/21 12/12/22  [name], MD  ?albuterol (VENTOLIN HFA) 108 (90 Base) MCG/ACT inhaler Inhale 2  puffs into the lungs every 6 (six) hours as needed for wheezing or shortness of breath. ?Patient not taking: Reported on 02/03/2022 01/09/22   [name], DO  ?atorvastatin (LIPITOR) 40 MG tablet TAKE ONE TABLET BY MOUTH DAILY ?Patient taking differently: Take 40 mg by mouth daily. 07/06/20   [name], MD  ?benztropine (COGENTIN) 0.5 MG tablet Take 0.5 mg by mouth at bedtime.    [provider]  ?Budeson-Glycopyrrol-Formoterol (BREZTRI AEROSPHERE) 160-9-4.8 MCG/ACT AERO Inhale 2 puffs into the lungs in the morning and at bedtime. 12/12/21   [name], MD  ?cetirizine (ZYRTEC) 10 MG tablet Take 10 mg by mouth daily.    [provider]  ?dextromethorphan-guaiFENesin (MUCINEX DM) 30-600 MG 12hr tablet Take 1 tablet by mouth 2 (two) times daily as needed for cough. 01/13/22   [name], MD  ?diclofenac Sodium (VOLTAREN) 1 % GEL Apply 2 g topically 4 (four) times daily.    [provider]  ?dicyclomine (BENTYL) 20 MG tablet Take 1 tablet (20 mg total) by mouth 2 (two) times daily as needed for spasms. 12/15/21   [name], PA-C  ?dorzolamide (TRUSOPT) 2 % ophthalmic solution Place 1 drop into the left eye 2 (two) times daily. 12/12/20   [provider]  ?doxycycline (VIBRAMYCIN) 100 MG capsule Take 1 capsule (100 mg total) by mouth 2 (two) times daily for 7 days. 02/14/22 02/21/22  Long,  Wonda Olds, MD  ?hydrochlorothiazide (HYDRODIURIL) 25 MG tablet Take 0.5 tablets (12.5 mg total) by mouth daily. 10/30/21   [name], MD  ?hydrOXYzine (ATARAX) 25 MG tablet Take 1 tablet (25 mg total) by mouth 3 (three) times daily as needed for anxiety or nausea. 10/30/21   [name], MD  ?latanoprost (XALATAN) 0.005 % ophthalmic solution Place 1 drop into both eyes at bedtime. 05/24/19   [provider]  ?loxapine (LOXITANE) 10 MG capsule Take 10 mg by mouth at bedtime.     [provider]  ?megestrol (MEGACE) 40 MG tablet Take 40 mg by mouth daily.     [provider]  ?mirtazapine (REMERON) 30 MG tablet Take 1 tablet (30 mg total) by mouth at bedtime. 10/30/21   [name], MD  ?Multiple Vitamin (MULTIVITAMIN WITH MINERALS) TABS tablet Take 1 tablet by mouth daily. 10/31/21   [name], MD  ?OXYGEN Inhale 3-4 L into the lungs as directed. 3-4 liters with sleep and exertion as needed for low oxygen    [provider]  ?pantoprazole (PROTONIX) 40 MG tablet Take 1 tablet (40 mg total) by mouth daily. 10/30/21   [name], MD  ?polyethylene glycol (MIRALAX / GLYCOLAX) 17 g packet TAKE 17 GRAMS BY MOUTH DAILY ?Patient taking differently: Take 17 g by mouth daily as needed for mild constipation. 03/09/20   [name], MD  ?predniSONE (DELTASONE) 10 MG tablet Take 2 tablets (20 mg total) by mouth daily. 02/03/22   [name], MD  ?risperiDONE (RISPERDAL) 2 MG tablet Take 1 tablet (2 mg total) by mouth at bedtime. 10/30/21   [name], MD  ?senna-docusate (SENOKOT-S) 8.6-50 MG tablet Take 2 tablets by mouth at bedtime. ?Patient not taking: Reported on 02/03/2022 10/30/21   [name], MD  ?simethicone (MYLICON) 40 TD/3.2KG drops Take 0.6 mLs (40 mg total) by mouth 4 (four) times daily as needed for flatulence. ?Patient not taking: Reported on 02/03/2022 10/17/21   Orpah Greek, MD  ?simethicone (MYLICON) 80 MG chewable tablet Chew 1 tablet (80 mg total) by mouth 4 (four) times daily as needed for flatulence. ?Patient not taking: Reported on 02/03/2022 01/09/22   [name], DO  ?   ? ?Allergies    ?Penicillins and Levocetirizine   ? ?Review of Systems   ?Review of Systems  ?Constitutional:  Negative for chills and fever.  ?Respiratory:  Positive for cough, shortness of breath and wheezing. Negative for chest tightness.   ?Cardiovascular:  Positive for chest pain and leg swelling.  ?Gastrointestinal:  Negative for abdominal pain, nausea and vomiting.  ?Genitourinary:  Negative for difficulty urinating.  ?Skin:  Negative  for rash.  ?Neurological:  Negative for dizziness, weakness, numbness and headaches.  ?Psychiatric/Behavioral:  Negative for confusion.   ?All other systems reviewed and are negative. ? ?Physical Exam ?Updated Vital Signs ?BP (!) 146/90 (BP Location: Right Arm)   Pulse 74   Temp 98 ?F (36.7 ?C) (Oral)   Resp (!) 26   Ht 5\' 11"  (1.803 m)   Wt 65.8 kg   SpO2 100%   BMI 20.22 kg/m?  ?Physical Exam ?Vitals and nursing note reviewed.  ?Constitutional:   ?   Appearance: He is not toxic-appearing.  ?   Comments: Patient uncomfortable appearing nontoxic  ?HENT:  ?   Mouth/Throat:  ?   Mouth: Mucous membranes are moist.  ?Cardiovascular:  ?   Rate and Rhythm: Normal rate and regular rhythm.  ?   Pulses:  Normal pulses.  ?Pulmonary:  ?   Breath sounds: Wheezing present.  ?   Comments: Increased work of breathing on exam.  Patient is on 3 L O2 by nasal cannula chronically.  Lung sounds are diminished with some inspiratory wheezes bilaterally. ?Chest:  ?   Chest wall: Tenderness present.  ?Abdominal:  ?   Palpations: Abdomen is soft.  ?   Tenderness: There is no abdominal tenderness.  ?Musculoskeletal:  ?   Right lower leg: Edema present.  ?   Left lower leg: Edema present.  ?Skin: ?   General: Skin is warm.  ?   Capillary Refill: Capillary refill takes less than 2 seconds.  ?   Findings: No rash.  ?Neurological:  ?   General: No focal deficit present.  ?   Mental Status: He is alert.  ?   Sensory: No sensory deficit.  ?   Motor: No weakness.  ? ? ?ED Results / Procedures / Treatments   ?Labs ?(all labs ordered are listed, but only abnormal results are displayed) ?Labs Reviewed  ?BASIC METABOLIC PANEL - Abnormal; Notable for the following components:  ?    Result Value  ? CO2 35 (*)   ? All other components within normal limits  ?CBC - Abnormal; Notable for the following components:  ? WBC 11.9 (*)   ? MCV 102.0 (*)   ? All other components within normal limits  ?BRAIN NATRIURETIC PEPTIDE  ?TROPONIN I (HIGH SENSITIVITY)   ?TROPONIN I (HIGH SENSITIVITY)  ? ? ?EKG ?None ? ?Radiology ?DG Chest 2 View ? ?Result Date: 02/19/2022 ?CLINICAL DATA:  Chest Pain EXAM: CHEST - 2 VIEW COMPARISON:  February 14, 2022. FINDINGS: No consolidation. No v

## 2022-02-19 NOTE — H&P (Signed)
?History and Physical  ? ? ?Patient: Tony Sullivan NLG:[id number] DOB: Jun 25, 1961 ?DOA: 02/19/2022 ?DOS: the patient was seen and examined on 02/19/2022 ?PCP: [name], FNP  ?Patient coming from: Home ? ?Chief Complaint:  ?Chief Complaint  ?Patient presents with  ? Chest Pain  ? ?HPI: Tony Sullivan is a 61 y.o. 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  He was seen in the ED on 4/20 and chest x-ray done at that time showed baseline appearance of the chest.  CT angiography of chest done showed segmental airway opacification in the left upper lobe.  He was discharged with doxycycline.  Patient states that he was also prescribed prednisone and was asked to follow-up with his pulmonologist (Dr. [name]).  He states that he has only taken 2 days of antibiotics and has not taken the prednisone in 3 days due to accidentally dropping the medication in water with Epsom salt.  He denies headache, fever, nausea, vomiting, abdominal pain.  Patient endorsed bilateral pedal edema and he states that he takes HCTZ for this. ?Patient was admitted from 3/13 to 3/15 due to community-acquired pneumonia. ? ?ED Course:  ?In the emergency department, he was intermittently tachypneic, BP was 151/84, and other vital signs were within normal range, O2 sat was 97-100% on supplemental oxygen via [place] at 3 LPM.  Work-up in the ED showed normal CBC except for leukocytosis and elevated MCV (102, BMP was normal except for bicarb of 35, troponin x2 was negative, BNP was 72.0 ?Chest x-ray showed no evidence of acute cardiopulmonary disease ?He was provided with breathing treatment.  Prednisone was given.  Specialist was asked to admit patient for further evaluation and management. ? ? ?Review of Systems: ?Review of  systems as noted in the HPI. All other systems reviewed and are negative. ? ? ?Past Medical History:  ?Diagnosis Date  ? Anxiety   ? Arthritis   ? COPD (chronic obstructive pulmonary disease) ([place]) 2004  ? diagnosed in 2004, no PFT's to date.  Started on home O2 12/2013, after found to be desatting at PCP's office, and referred to pulmonology.  ? Depression   ? GERD (gastroesophageal reflux disease)   ? High blood pressure   ? Prediabetes   ? Seasonal allergies   ? Substance abuse ([place])   ? Tobacco dependence   ? ?Past Surgical History:  ?Procedure Laterality Date  ? ANTERIOR CERVICAL DECOMP/DISCECTOMY FUSION N/A 09/06/2019  ? Procedure: Anterior Cervical Discecectomy Fusion - Cerivcal Five-Cervical Six;  Surgeon: Kary Kos, MD;  Location: [facility];  Service: Neurosurgery;  Laterality: N/A;  Anterior Cervical Discecectomy Fusion - Cerivcal Five-Cervical Six  ? ANTERIOR CERVICAL DISCECTOMY  09/06/2019  ? APPENDECTOMY  1980  ? COLONOSCOPY WITH PROPOFOL N/A 12/10/2016  ? Procedure: COLONOSCOPY WITH PROPOFOL;  Surgeon: [name], MD;  Location: [facility];  Service: Endoscopy;  Laterality: N/A;  ? HIP SURGERY Right   ? SHOULDER ARTHROSCOPY Right   ? ? ?Social History:  reports that he quit smoking about 3 months ago. His smoking use included cigarettes. He has a 9.00 pack-year smoking history. He has never used smokeless tobacco. He reports that he does not currently use alcohol after a past usage of about 14.0 - 21.0 standard drinks per week. He reports current  drug use. Drugs: Marijuana and Cocaine. ? ? ?Allergies  ?Allergen Reactions  ? Penicillins Anaphylaxis  ?  Has patient had a PCN reaction causing immediate rash, facial/tongue/throat swelling, SOB or lightheadedness with hypotension: Yes ?Has patient had a PCN reaction causing severe rash involving mucus membranes or skin necrosis: No ?Has patient had a PCN reaction that required hospitalization No ?Has patient had a PCN reaction occurring within the last 10  years: No ?If all of the above answers are "NO", then may proceed with Cephalosporin use. ?  ? Levocetirizine Other (See Comments)  ?  Muscle cramps  ? ? ?Family History  ?Problem Relation Age of Onset  ? Emphysema Mother   ? Allergies Mother   ?     "everyone in family"  ? Asthma Mother   ?     "everyone in family"  ? Heart disease Mother   ? Clotting disorder Mother   ? Cancer Mother   ? Emphysema Father   ? Allergies Father   ? Allergies Son   ? Seizures Brother   ? Diabetes Brother   ?  ? ?Prior to Admission medications   ?Medication Sig Start Date End Date Taking? Authorizing Provider  ?acetaminophen (TYLENOL) 325 MG tablet Take 2 tablets (650 mg total) by mouth every 6 (six) hours as needed for mild pain (or Fever >/= 101). 10/30/21  Yes Emokpae, Courage, MD  ?albuterol (PROVENTIL) (2.5 MG/3ML) 0.083% nebulizer solution Take 3 mLs (2.5 mg total) by nebulization every 4 (four) hours as needed for wheezing or shortness of breath. 12/12/21 12/12/22 Yes [name], MD  ?albuterol (VENTOLIN HFA) 108 (90 Base) MCG/ACT inhaler Inhale 2 puffs into the lungs every 6 (six) hours as needed for wheezing or shortness of breath. 01/09/22  Yes Shah, Pratik D, DO  ?atorvastatin (LIPITOR) 40 MG tablet TAKE ONE TABLET BY MOUTH DAILY ?Patient taking differently: Take 40 mg by mouth daily. 07/06/20  Yes [name], MD  ?benztropine (COGENTIN) 0.5 MG tablet Take 0.5 mg by mouth at bedtime.   Yes [provider]  ?Budeson-Glycopyrrol-Formoterol (BREZTRI AEROSPHERE) 160-9-4.8 MCG/ACT AERO Inhale 2 puffs into the lungs in the morning and at bedtime. 12/12/21  Yes [name], MD  ?cetirizine (ZYRTEC) 10 MG tablet Take 10 mg by mouth daily.   Yes [provider]  ?diclofenac Sodium (VOLTAREN) 1 % GEL Apply 2 g topically 4 (four) times daily.   Yes [provider]  ?dicyclomine (BENTYL) 20 MG tablet Take 1 tablet (20 mg total) by mouth 2 (two) times daily as needed for spasms. 12/15/21  Yes [name], PA-C  ?dorzolamide (TRUSOPT) 2 % ophthalmic solution Place 1 drop into the left eye 2 (two) times daily. 12/12/20  Yes [provider]  ?doxycycline (VIBRAMYCIN) 100 MG capsule Take 1 capsule (100 mg total) by mouth 2 (two) times daily for 7 days. 02/14/22 02/21/22 Yes Long, Wonda Olds, MD  ?hydrochlorothiazide (HYDRODIURIL) 25 MG tablet Take 0.5 tablets (12.5 mg total) by mouth daily. 10/30/21  Yes [name], MD  ?hydrOXYzine (ATARAX) 25 MG tablet Take 1 tablet (25 mg total) by mouth 3 (three) times daily as needed for anxiety or nausea. 10/30/21  Yes Emokpae, Courage, MD  ?latanoprost (XALATAN) 0.005 % ophthalmic solution Place 1 drop into both eyes at bedtime. 05/24/19  Yes [provider]  ?losartan (COZAAR) 100 MG tablet Take 100 mg by mouth daily.   Yes [provider]  ?loxapine (LOXITANE) 10 MG capsule Take  10 mg by mouth at bedtime.    Yes [provider]  ?mirtazapine (REMERON) 30 MG tablet Take 1 tablet (30 mg total) by mouth at bedtime. 10/30/21  Yes [name], MD  ?Multiple Vitamin (MULTIVITAMIN WITH MINERALS) TABS tablet Take 1 tablet by mouth daily. 10/31/21  Yes Emokpae, Courage, MD  ?OXYGEN Inhale 3-4 L into the lungs as directed. 3-4 liters with sleep and exertion as needed for low oxygen   Yes [provider]  ?pantoprazole (PROTONIX) 40 MG tablet Take 1 tablet (40 mg total) by mouth daily. 10/30/21  Yes Emokpae, Courage, MD  ?polyethylene glycol (MIRALAX / GLYCOLAX) 17 g packet TAKE 17 GRAMS BY MOUTH DAILY ?Patient taking differently: Take 17 g by mouth daily as needed for mild constipation. 03/09/20  Yes [name], MD  ?predniSONE (DELTASONE) 10 MG tablet Take 2 tablets (20 mg total) by mouth daily. ?Patient taking differently: Take 20 mg by mouth 2 (two) times daily with a meal. 02/03/22  Yes [name], MD  ?risperiDONE (RISPERDAL) 2 MG tablet Take 1 tablet (2 mg total) by mouth at bedtime. 10/30/21  Yes Emokpae, Courage, MD   ?dextromethorphan-guaiFENesin (MUCINEX DM) 30-600 MG 12hr tablet Take 1 tablet by mouth 2 (two) times daily as needed for cough. ?Patient not taking: Reported on 02/19/2022 01/13/22   [name], MD  ?senna-docusate Chauncey Cruel

## 2022-02-19 NOTE — ED Notes (Signed)
Pt was giving drink and crackers ?

## 2022-02-19 NOTE — ED Triage Notes (Signed)
Patient with complaints of chest pain and cough for 3 days. Currently still taking antibiotic for pneumonia.  ?

## 2022-02-20 ENCOUNTER — Observation Stay (HOSPITAL_COMMUNITY): Payer: Medicaid Other

## 2022-02-20 ENCOUNTER — Other Ambulatory Visit: Payer: Self-pay

## 2022-02-20 DIAGNOSIS — Z7952 Long term (current) use of systemic steroids: Secondary | ICD-10-CM | POA: Diagnosis not present

## 2022-02-20 DIAGNOSIS — H409 Unspecified glaucoma: Secondary | ICD-10-CM | POA: Diagnosis present

## 2022-02-20 DIAGNOSIS — M1990 Unspecified osteoarthritis, unspecified site: Secondary | ICD-10-CM | POA: Diagnosis present

## 2022-02-20 DIAGNOSIS — E782 Mixed hyperlipidemia: Secondary | ICD-10-CM | POA: Diagnosis present

## 2022-02-20 DIAGNOSIS — J189 Pneumonia, unspecified organism: Secondary | ICD-10-CM | POA: Diagnosis present

## 2022-02-20 DIAGNOSIS — F17200 Nicotine dependence, unspecified, uncomplicated: Secondary | ICD-10-CM | POA: Diagnosis present

## 2022-02-20 DIAGNOSIS — Z7951 Long term (current) use of inhaled steroids: Secondary | ICD-10-CM | POA: Diagnosis not present

## 2022-02-20 DIAGNOSIS — Z888 Allergy status to other drugs, medicaments and biological substances status: Secondary | ICD-10-CM | POA: Diagnosis not present

## 2022-02-20 DIAGNOSIS — J441 Chronic obstructive pulmonary disease with (acute) exacerbation: Secondary | ICD-10-CM | POA: Diagnosis present

## 2022-02-20 DIAGNOSIS — Z981 Arthrodesis status: Secondary | ICD-10-CM | POA: Diagnosis not present

## 2022-02-20 DIAGNOSIS — Z9981 Dependence on supplemental oxygen: Secondary | ICD-10-CM | POA: Diagnosis not present

## 2022-02-20 DIAGNOSIS — Z825 Family history of asthma and other chronic lower respiratory diseases: Secondary | ICD-10-CM | POA: Diagnosis not present

## 2022-02-20 DIAGNOSIS — R7303 Prediabetes: Secondary | ICD-10-CM | POA: Diagnosis present

## 2022-02-20 DIAGNOSIS — Z880 Allergy status to penicillin: Secondary | ICD-10-CM | POA: Diagnosis not present

## 2022-02-20 DIAGNOSIS — K219 Gastro-esophageal reflux disease without esophagitis: Secondary | ICD-10-CM | POA: Diagnosis present

## 2022-02-20 DIAGNOSIS — J440 Chronic obstructive pulmonary disease with acute lower respiratory infection: Secondary | ICD-10-CM | POA: Diagnosis present

## 2022-02-20 DIAGNOSIS — Z87892 Personal history of anaphylaxis: Secondary | ICD-10-CM | POA: Diagnosis not present

## 2022-02-20 DIAGNOSIS — I10 Essential (primary) hypertension: Secondary | ICD-10-CM | POA: Diagnosis present

## 2022-02-20 DIAGNOSIS — Z79899 Other long term (current) drug therapy: Secondary | ICD-10-CM | POA: Diagnosis not present

## 2022-02-20 LAB — COMPREHENSIVE METABOLIC PANEL
ALT: 22 U/L (ref 0–44)
AST: 15 U/L (ref 15–41)
Albumin: 2.8 g/dL — ABNORMAL LOW (ref 3.5–5.0)
Alkaline Phosphatase: 58 U/L (ref 38–126)
Anion gap: 7 (ref 5–15)
BUN: 22 mg/dL — ABNORMAL HIGH (ref 6–20)
CO2: 34 mmol/L — ABNORMAL HIGH (ref 22–32)
Calcium: 8.6 mg/dL — ABNORMAL LOW (ref 8.9–10.3)
Chloride: 103 mmol/L (ref 98–111)
Creatinine, Ser: 0.87 mg/dL (ref 0.61–1.24)
GFR, Estimated: >60 mL/min (ref >60)
Glucose, Bld: 115 mg/dL — ABNORMAL HIGH (ref 70–99)
Potassium: 2.9 mmol/L — ABNORMAL LOW (ref 3.5–5.1)
Sodium: 144 mmol/L (ref 135–145)
Total Bilirubin: 0.3 mg/dL (ref 0.3–1.2)
Total Protein: 5.5 g/dL — ABNORMAL LOW (ref 6.5–8.1)

## 2022-02-20 LAB — CBC
HCT: 38.7 % — ABNORMAL LOW (ref 39.0–52.0)
Hemoglobin: 11.7 g/dL — ABNORMAL LOW (ref 13.0–17.0)
MCH: 30.6 pg (ref 26.0–34.0)
MCHC: 30.2 g/dL (ref 30.0–36.0)
MCV: 101.3 fL — ABNORMAL HIGH (ref 80.0–100.0)
Platelets: 224 10*3/uL (ref 150–400)
RBC: 3.82 MIL/uL — ABNORMAL LOW (ref 4.22–5.81)
RDW: 15.2 % (ref 11.5–15.5)
WBC: 11.5 10*3/uL — ABNORMAL HIGH (ref 4.0–10.5)
nRBC: 0 % (ref 0.0–0.2)

## 2022-02-20 LAB — FOLATE: Folate: 7.7 ng/mL (ref >5.9)

## 2022-02-20 LAB — TROPONIN I (HIGH SENSITIVITY)
Troponin I (High Sensitivity): 13 ng/L (ref <18)
Troponin I (High Sensitivity): 13 ng/L (ref <18)

## 2022-02-20 LAB — PHOSPHORUS: Phosphorus: 2.5 mg/dL (ref 2.5–4.6)

## 2022-02-20 LAB — MAGNESIUM: Magnesium: 2.3 mg/dL (ref 1.7–2.4)

## 2022-02-20 LAB — APTT: aPTT: 30 seconds (ref 24–36)

## 2022-02-20 LAB — VITAMIN B12: Vitamin B-12: 530 pg/mL (ref 180–914)

## 2022-02-20 IMAGING — DX DG CHEST 1V PORT
2 series · 2 of 2 positions shown · non-contrast
Comparison: [DATE], [DATE] and CT chest [DATE].

CLINICAL DATA: Chest pain, cough, shortness of breath.

EXAM:
PORTABLE CHEST 1 VIEW

[chest ap (1 of 2)]
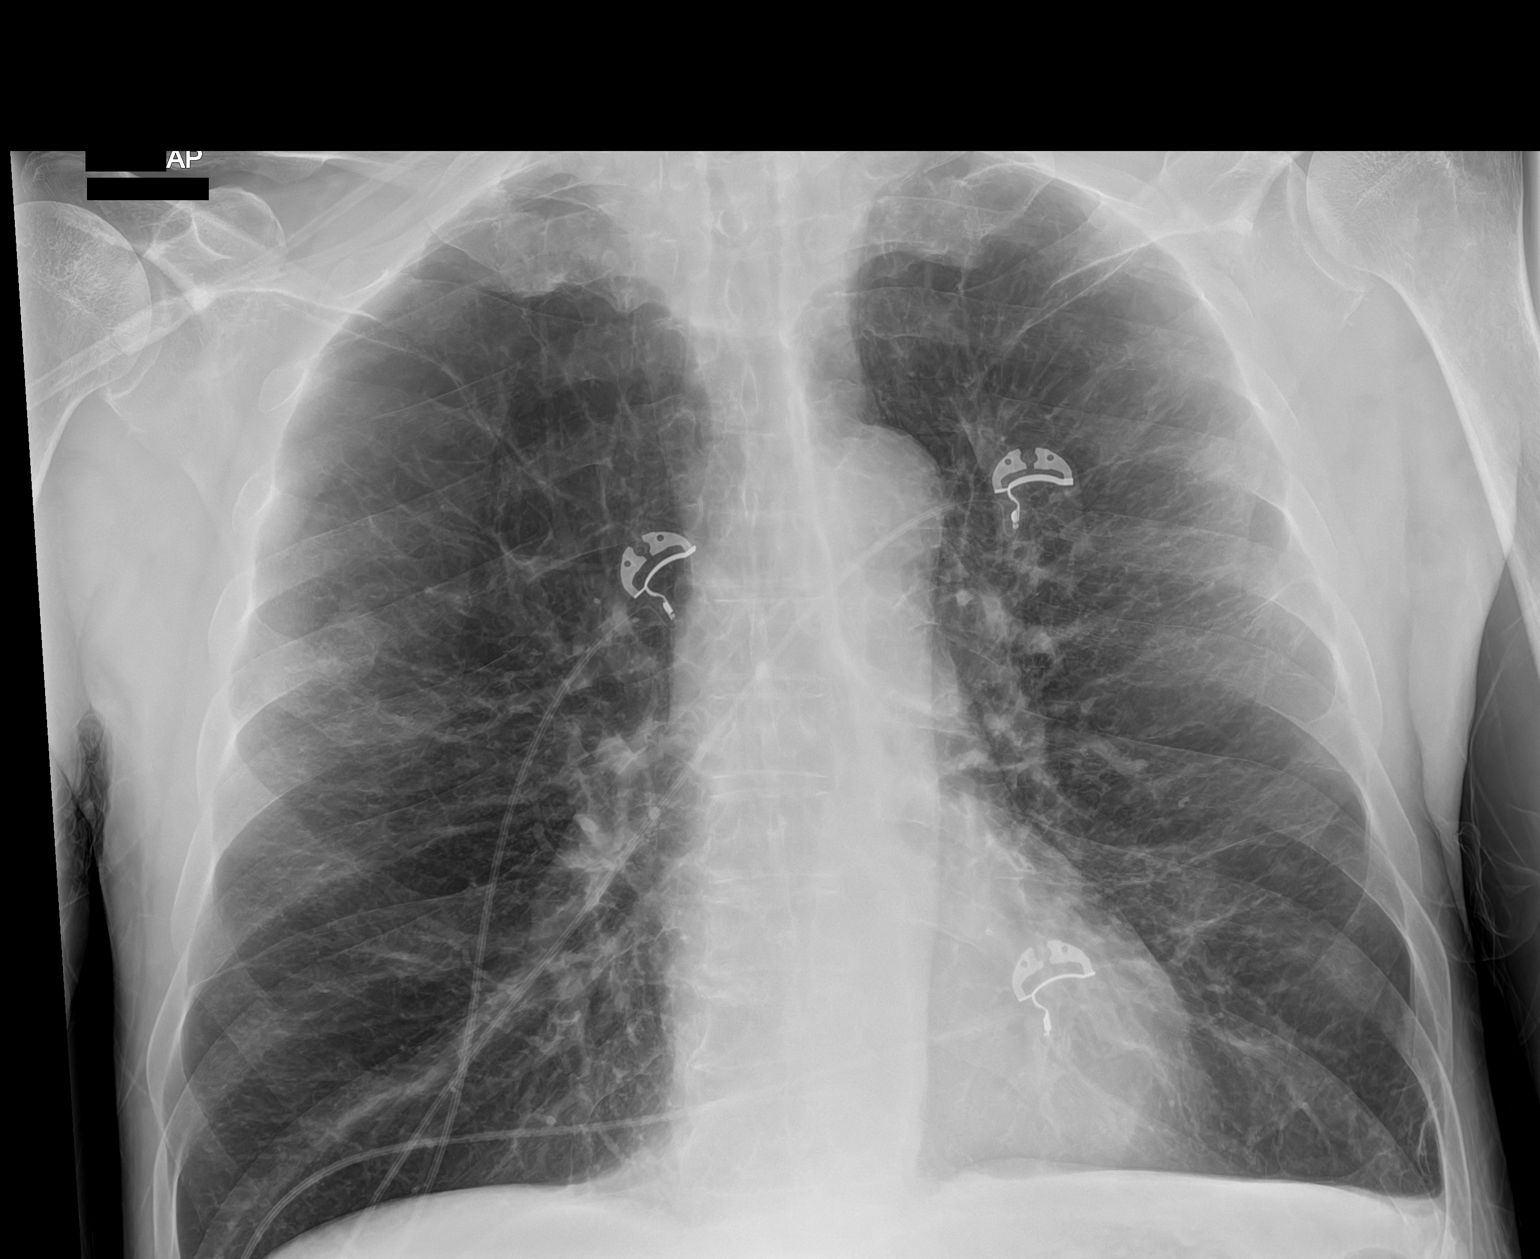

[chest ap (2 of 2)]
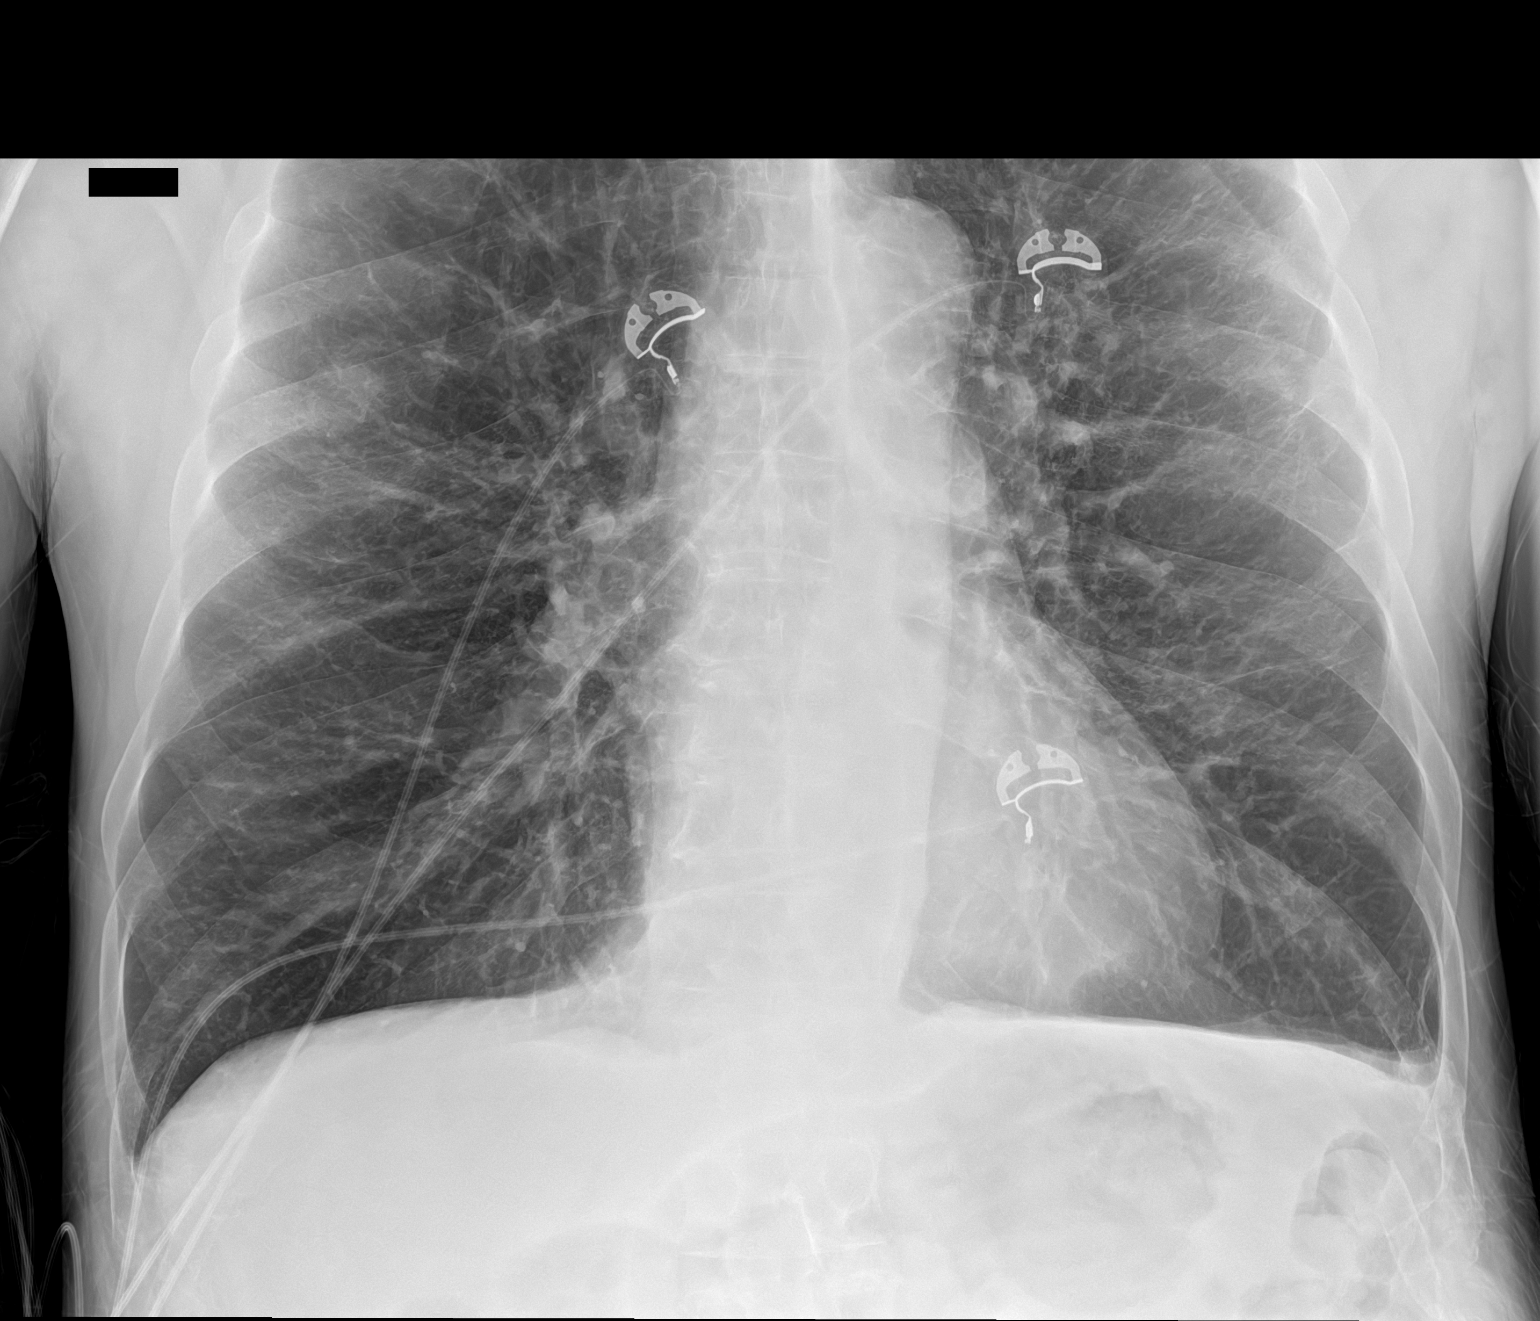

[2 of 2 positions shown; findings below may reference images not displayed]

FINDINGS: Apical lordotic view. Trachea is midline. Heart size normal.
Thoracic aorta is calcified. Lungs are hyperinflated but clear.
Chronic blunting of left costophrenic angle. No pleural fluid. Old
right posterior rib fracture.
IMPRESSION: Hyperinflation without acute finding.

## 2022-02-20 MED ORDER — GUAIFENESIN-DM 100-10 MG/5ML PO SYRP
10.0000 mL | ORAL_SOLUTION | Freq: Three times a day (TID) | ORAL | Status: DC
Start: 2022-02-20 — End: 2022-02-21
  Administered 2022-02-20 – 2022-02-21 (×4): 10 mL via ORAL
  Filled 2022-02-20 (×4): qty 10

## 2022-02-20 MED ORDER — LORATADINE 10 MG PO TABS
10.0000 mg | ORAL_TABLET | Freq: Every day | ORAL | Status: DC
Start: 2022-02-20 — End: 2022-02-21
  Administered 2022-02-20 – 2022-02-21 (×2): 10 mg via ORAL
  Filled 2022-02-20 (×2): qty 1

## 2022-02-20 MED ORDER — HYDROXYZINE HCL 25 MG PO TABS
25.0000 mg | ORAL_TABLET | Freq: Three times a day (TID) | ORAL | Status: DC | PRN
  Administered 2022-02-20: 25 mg via ORAL
  Filled 2022-02-20: qty 1

## 2022-02-20 MED ORDER — DICLOFENAC SODIUM 1 % EX GEL
2.0000 g | Freq: Four times a day (QID) | CUTANEOUS | Status: DC
  Filled 2022-02-20: qty 100

## 2022-02-20 MED ORDER — LOSARTAN POTASSIUM 50 MG PO TABS
100.0000 mg | ORAL_TABLET | Freq: Every day | ORAL | Status: DC
Start: 2022-02-20 — End: 2022-02-21
  Administered 2022-02-20 – 2022-02-21 (×2): 100 mg via ORAL
  Filled 2022-02-20 (×3): qty 2

## 2022-02-20 MED ORDER — DM-GUAIFENESIN ER 30-600 MG PO TB12
1.0000 | ORAL_TABLET | Freq: Two times a day (BID) | ORAL | Status: DC | PRN
  Administered 2022-02-20: 1 via ORAL
  Filled 2022-02-20: qty 1

## 2022-02-20 MED ORDER — RISPERIDONE 1 MG PO TABS
2.0000 mg | ORAL_TABLET | Freq: Every day | ORAL | Status: DC
Start: 2022-02-20 — End: 2022-02-21
  Filled 2022-02-20: qty 2

## 2022-02-20 MED ORDER — HYDROCHLOROTHIAZIDE 12.5 MG PO TABS
12.5000 mg | ORAL_TABLET | Freq: Every day | ORAL | Status: DC
  Administered 2022-02-20 – 2022-02-21 (×2): 12.5 mg via ORAL
  Filled 2022-02-20 (×3): qty 1

## 2022-02-20 MED ORDER — HYDROCOD POLI-CHLORPHE POLI ER 10-8 MG/5ML PO SUER
5.0000 mL | Freq: Two times a day (BID) | ORAL | Status: DC
  Administered 2022-02-20 – 2022-02-21 (×3): 5 mL via ORAL
  Filled 2022-02-20 (×3): qty 5

## 2022-02-20 MED ORDER — SALINE SPRAY 0.65 % NA SOLN
1.0000 | NASAL | Status: DC | PRN
  Administered 2022-02-20: 1 via NASAL
  Filled 2022-02-20: qty 44

## 2022-02-20 MED ORDER — DORZOLAMIDE HCL 2 % OP SOLN
1.0000 [drp] | Freq: Two times a day (BID) | OPHTHALMIC | Status: DC
  Administered 2022-02-20 – 2022-02-21 (×3): 1 [drp] via OPHTHALMIC
  Filled 2022-02-20: qty 10

## 2022-02-20 MED ORDER — SIMETHICONE 40 MG/0.6ML PO SUSP: 40.0000 mg | Freq: Four times a day (QID) | ORAL | Status: DC | PRN

## 2022-02-20 MED ORDER — BENZTROPINE MESYLATE 1 MG PO TABS
0.5000 mg | ORAL_TABLET | Freq: Every day | ORAL | Status: DC
  Filled 2022-02-20: qty 1

## 2022-02-20 MED ORDER — ADULT MULTIVITAMIN W/MINERALS CH
1.0000 | ORAL_TABLET | Freq: Every day | ORAL | Status: DC
  Administered 2022-02-20 – 2022-02-21 (×2): 1 via ORAL
  Filled 2022-02-20 (×2): qty 1

## 2022-02-20 MED ORDER — LEVOFLOXACIN IN D5W 750 MG/150ML IV SOLN
750.0000 mg | INTRAVENOUS | Status: DC
  Administered 2022-02-20: 750 mg via INTRAVENOUS
  Filled 2022-02-20: qty 150

## 2022-02-20 MED ORDER — SENNOSIDES-DOCUSATE SODIUM 8.6-50 MG PO TABS
1.0000 | ORAL_TABLET | Freq: Two times a day (BID) | ORAL | Status: DC
  Administered 2022-02-20 – 2022-02-21 (×3): 1 via ORAL
  Filled 2022-02-20 (×3): qty 1

## 2022-02-20 MED ORDER — ATORVASTATIN CALCIUM 40 MG PO TABS
40.0000 mg | ORAL_TABLET | Freq: Every day | ORAL | Status: DC
  Administered 2022-02-20 – 2022-02-21 (×2): 40 mg via ORAL
  Filled 2022-02-20 (×2): qty 1

## 2022-02-20 MED ORDER — ASPIRIN 325 MG PO TABS
325.0000 mg | ORAL_TABLET | Freq: Once | ORAL | Status: AC
  Administered 2022-02-20: 325 mg via ORAL
  Filled 2022-02-20: qty 1

## 2022-02-20 NOTE — TOC Progression Note (Signed)
Transition of Care (TOC) - Progression Note  ? ? ?Patient Details  ?Name: Tony Sullivan ?MRN: [id number] ?Date of Birth: 05/17/1961 ? ?Transition of Care (TOC) CM/SW Contact  ?,  Shanaberger, LCSW ?Phone Number: ?02/20/2022, 9:01 AM ? ?Clinical Narrative:   ?Transition of Care (TOC) Screening Note ? ? ?Patient Details  ?Name: Tony Sullivan ?Date of Birth: 08/03/1961 ? ? ?Transition of Care (TOC) CM/SW Contact:    ?,  Shanaberger, LCSW ?Phone Number: ?02/20/2022, 9:01 AM ? ?TOC received consult that pt would like HHRN. LCSW met with pt at bedside. He reports he has an aid for 3 hours a day, 7 days a week. Pt unsure what he would want RN for. LCSW described services they provide and pt states he does not need any of these. No other needs reported at this time. TOC Sullivan continue to follow.  ? ?Transition of Care Department (TOC) has reviewed patient and no TOC needs have been identified at this time. We Sullivan continue to monitor patient advancement through interdisciplinary progression rounds. If new patient transition needs arise, please place a TOC consult. ?   ? ? ? ?  ?Barriers to Discharge: Continued Medical Work up ? ?Expected Discharge Plan and Services ?  ?  ?  ?  ?  ?                ?  ?  ?  ?  ?  ?  ?  ?  ?  ?  ? ? ?Social Determinants of Health (SDOH) Interventions ?  ? ?Readmission Risk Interventions ? ?  09/08/2019  ?  3:22 PM  ?Readmission Risk Prevention Plan  ?Transportation Screening Complete  ?PCP or Specialist Appt within 3-5 Days Complete  ?HRI or Home Care Consult Complete  ?Social Work Consult for Recovery Care Planning/Counseling Complete  ?Palliative Care Screening Not Applicable  ?Medication Review (RN Care Manager) Complete  ? ? ?

## 2022-02-20 NOTE — Progress Notes (Signed)
NIF -40   FVC 1.6L  best of three tries of each post treatment ?

## 2022-02-20 NOTE — Progress Notes (Signed)
AT 0900 HR, patient performed NIF -30 and FVC 738ml. Patient with good effort.Patient attempted 3x on both and the best of 3 attempts documented. ?

## 2022-02-20 NOTE — Hospital Course (Signed)
Tony Sullivan is a 61 y.o. 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  He was seen in the ED on 4/20 and chest x-ray done at that time showed baseline appearance of the chest.  CT angiography of chest done showed segmental airway opacification in the left upper lobe.  He was discharged with doxycycline.  Patient states that he was also prescribed prednisone and was asked to follow-up with his pulmonologist (Dr. [name]).  He states that he has only taken 2 days of antibiotics and has not taken the prednisone in 3 days due to accidentally dropping the medication in water with Epsom salt.  He denies headache, fever, nausea, vomiting, abdominal pain.  Patient endorsed bilateral pedal edema and he states that he takes HCTZ for this. ?Patient was admitted from 3/13 to 3/15 due to community-acquired pneumonia. ?  ?ED Course:  ? Intermittently tachypneic, BP was 151/84, and other vital signs were within normal range, O2 sat was 97-100% on supplemental oxygen via [place] at 3 LPM.  ?Work-up in the ED: showed normal CBC except for leukocytosis and elevated MCV (102, BMP was normal except for bicarb of 35, troponin x2 was negative, BNP was 72.0 ?Chest x-ray showed no evidence of acute cardiopulmonary disease ?He was provided with breathing treatment.  Prednisone was given.  Specialist was asked to admit patient for further evaluation and management. ?  ?

## 2022-02-20 NOTE — Progress Notes (Signed)
Pharmacy Antibiotic Note ? ?Tony Sullivan is a 61 y.o. male admitted on 02/19/2022 with pneumonia.  Pharmacy has been consulted for Levofloxacin dosing. Pt currently on doxy ? ?Plan: ?Levofloxacin 750mg  IV q24h ?DC doxy? ? ?Height: 5\' 11"  (180.3 cm) ?Weight: 65.8 kg (145 lb) ?IBW/kg (Calculated) : 75.3 ? ?Temp (24hrs), Avg:98 ?F (36.7 ?C), Min:97.7 ?F (36.5 ?C), Max:98.7 ?F (37.1 ?C) ? ?Recent Labs  ?Lab 02/14/22 ?2353 02/19/22 ?1013 02/20/22 ?0411  ?WBC 11.4* 11.9* 11.5*  ?CREATININE 0.82 0.86 0.87  ?  ?Estimated Creatinine Clearance: 86.1 mL/min (by C-G formula based on SCr of 0.87 mg/dL).   ? ?Allergies  ?Allergen Reactions  ? Penicillins Anaphylaxis  ?  Has patient had a PCN reaction causing immediate rash, facial/tongue/throat swelling, SOB or lightheadedness with hypotension: Yes ?Has patient had a PCN reaction causing severe rash involving mucus membranes or skin necrosis: No ?Has patient had a PCN reaction that required hospitalization No ?Has patient had a PCN reaction occurring within the last 10 years: No ?If all of the above answers are "NO", then may proceed with Cephalosporin use. ?  ? Levocetirizine Other (See Comments)  ?  Muscle cramps  ? ? ?Antimicrobials this admission: ?4/25 Doxy >> (was on this outpatient) ? ?Microbiology results: ?No cultures ? ?Thank you for allowing pharmacy to be a part of this patient?s care. ? ?Lorenso Courier, PharmD ?Clinical Pharmacist ?02/20/2022 11:53 AM ? ? ? ? ? ? ?

## 2022-02-20 NOTE — Progress Notes (Signed)
?PROGRESS NOTE ? ? ? ?Patient: Tony Sullivan                            PCP: [name], FNP                    ?DOB: 1961/04/27            DOA: 02/19/2022 ?[id number]             DOS: 02/20/2022, 11:00 AM ? ? LOS: 0 days  ? ?Date of Service: The patient was seen and examined on 02/20/2022 ? ?Subjective:  ? ?The patient was seen and examined this morning. ?Hemodynamically stable, still complaining of cough congestion, shortness of breath with minimal exertion ?O2 demand has improved, currently on 3 L of oxygen satting 99% ? ?Brief Narrative:  ? ?Tony Sullivan is a 61 y.o. 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  He was seen in the ED on 4/20 and chest x-ray done at that time showed baseline appearance of the chest.  CT angiography of chest done showed segmental airway opacification in the left upper lobe.  He was discharged with doxycycline.  Patient states that he was also prescribed prednisone and was asked to follow-up with his pulmonologist (Dr. [name]).  He states that he has only taken 2 days of antibiotics and has not taken the prednisone in 3 days due to accidentally dropping the medication in water with Epsom salt.  He denies headache, fever, nausea, vomiting, abdominal pain.  Patient endorsed bilateral pedal edema and he states that he takes HCTZ for this. ?Patient was admitted from 3/13 to 3/15 due to community-acquired pneumonia. ?  ?ED Course:  ? Intermittently tachypneic, BP was 151/84, and other vital signs were within normal range, O2 sat was 97-100% on supplemental oxygen via [place] at 3 LPM.  ?Work-up in the ED: showed normal CBC except for leukocytosis and elevated MCV (102, BMP was normal except for bicarb of 35, troponin x2 was negative, BNP was 72.0 ?Chest  x-ray showed no evidence of acute cardiopulmonary disease ?He was provided with breathing treatment.  Prednisone was given.  Specialist was asked to admit patient for further evaluation and management. ?  ? ? ? ?Assessment & Plan:  ? ?Principal Problem: ?  Acute exacerbation of chronic obstructive pulmonary disease (COPD) ([place]) ?Active Problems: ?  CAP (community acquired pneumonia) ?  Leukocytosis ?  Smoker ?  Essential hypertension ?  GERD (gastroesophageal reflux disease) ?  Mixed hyperlipidemia ?  Elevated MCV ?  Glaucoma ?  Pedal edema ? ? ? ? ?Assessment and Plan: ? ?Acute exacerbation of chronic obstructive pulmonary disease (COPD) ([place]) ?Acute exacerbation of COPD ?-Complaining shortness of breath with minimal exertion, with cough, congestion ? ?Continue duo nebs, Mucinex, Solu-Medrol, azithromycin, Dulera, Robitussin, Tussionex ?Continue Protonix to prevent steroid-induced ulcer ?Continue incentive spirometry and flutter valve ?Continue supplemental oxygen to maintain O2 sat > 94%  ?-Currently on 3 L of oxygen, satting 99% ?-Obtaining another chest x-ray ? ?Community-acquired pneumonia ?Patient was started on doxycycline as an outpatient,  ?We will switch to azithromycin ?Continue Tylenol as needed ?Continue Mucinex, incentive spirometry, flutter valve  ?  ?Leukocytosis possibly reactive ?WBC 11.9, patient was on  prednisone ?Continue to monitor WBC with morning labs ?  ?Elevated MCV-102.0 ?Folate and vitamin B12 levels will be checked ? ?Glaucoma ?Continue latanoprost ? ?Mixed hyperlipidemia ?Continue Lipitor ?  ?GERD (gastroesophageal reflux disease) ?Continue Protonix ?  ?Essential hypertension ?Continue losartan ?  ?Pedal edema ?Continue on IV Lasix 20 mg daily ?-Anticipating discontinuing  ?  ?Smoker ?Patient was counseled on tobacco abuse cessation ? ? ?Severe debility, with chronic bilateral lower extremity weakness ?-Using a scooter at baseline ?-Consulting  PT ? ? ?\----------------------------------------------------------------------------------------------------------- ? ?DVT prophylaxis:  ?enoxaparin (LOVENOX) injection 40 mg Start: 02/19/22 2100 ?SCDs Start: 02/19/22 2009 ? ? ?Code Status:   Code Status: Full Code ? ?Family Communication: No family member present at bedside- attempt will be made to update daily ?The above findings and plan of care has been discussed with patient (and family)  in detail,  ?they expressed understanding and agreement of above. ?-Advance care planning has been discussed.  ? ?Admission status:   ?Status is: Observation ?The patient remains OBS appropriate and will d/c before 2 midnights. ? ? ? ? ?Procedures:  ? ?No admission procedures for hospital encounter. ? ? ?Antimicrobials:  ?Anti-infectives (From admission, onward)  ? ? Start     Dose/Rate Route Frequency Ordered Stop  ? 02/19/22 2330  doxycycline (VIBRA-TABS) tablet 100 mg       ? 100 mg Oral Every 12 hours 02/19/22 2233    ? ?  ? ? ? ?Medication:  ? chlorhexidine  15 mL Mouth Rinse BID  ? chlorpheniramine-HYDROcodone  5 mL Oral Q12H  ? doxycycline  100 mg Oral Q12H  ? enoxaparin (LOVENOX) injection  40 mg Subcutaneous Q24H  ? guaiFENesin-dextromethorphan  10 mL Oral Q8H  ? ipratropium-albuterol  3 mL Nebulization Q6H  ? mouth rinse  15 mL Mouth Rinse q12n4p  ? methylPREDNISolone (SOLU-MEDROL) injection  40 mg Intravenous Q12H  ? mometasone-formoterol  2 puff Inhalation BID  ? pantoprazole  40 mg Oral Daily  ? senna-docusate  1 tablet Oral BID  ? ? ?acetaminophen, dextromethorphan-guaiFENesin, ipratropium-albuterol, simethicone ? ? ?Objective:  ? ?Vitals:  ? 02/20/22 0527 02/20/22 0805 02/20/22 0806 02/20/22 1016  ?BP: (!) 158/90   (!) 161/99  ?Pulse: 71   74  ?Resp: 18   17  ?Temp: 97.8 ?F (36.6 ?C)   98.7 ?F (37.1 ?C)  ?TempSrc:    Oral  ?SpO2: 99% 94% 99% 99%  ?Weight:      ?Height:      ? ? ?Intake/Output Summary (Last 24 hours) at 02/20/2022 1100 ?Last data filed at  02/20/2022 0900 ?Gross per 24 hour  ?Intake 660 ml  ?Output 775 ml  ?Net -115 ml  ? ?Filed Weights  ? 02/19/22 0928  ?Weight: 65.8 kg  ? ? ? ?Examination:  ? ?Physical Exam  ?Constitution:  Alert, cooperative, no distress,  Appears calm and comfortable  ?Coughing, with shortness of breath ?Psychiatric:   Normal and stable mood and affect, cognition intact,   ?HEENT:        Normocephalic, PERRL, otherwise with in Normal limits  ?Chest:         Chest symmetric ?Cardio vascular:  S1/S2, RRR, No murmure, No Rubs or Gallops  ?pulmonary: Clear to auscultation bilaterally, respirations unlabored, mild diffuse wheezes / No crackles ?Abdomen: Soft, non-tender, non-distended, bowel sounds,no masses, no organomegaly ?Muscular skeletal: Limited exam - in bed, able to move all 4 extremities,   ?Neuro: CNII-XII intact. , normal motor and sensation, reflexes intact  ?Extremities: No pitting  edema lower extremities, +2 pulses  ?Skin: Dry, warm to touch, negative for any Rashes, No open wounds ?Wounds: per nursing documentation ? ? ?------------------------------------------------------------------------------------------------------------------------------------------ ?  ? ?LABs:  ? ?  Latest Ref Rng & Units 02/20/2022  ?  4:11 AM 02/19/2022  ? 10:13 AM 02/14/2022  ?  5:46 AM  ?CBC  ?WBC 4.0 - 10.5 K/uL 11.5   11.9   11.4    ?Hemoglobin 13.0 - 17.0 g/dL 11.7   14.3   13.0    ?Hematocrit 39.0 - 52.0 % 38.7   46.5   41.0    ?Platelets 150 - 400 K/uL 224   226   193    ? ? ?  Latest Ref Rng & Units 02/20/2022  ?  4:11 AM 02/19/2022  ? 10:13 AM 02/14/2022  ?  5:46 AM  ?CMP  ?Glucose 70 - 99 mg/dL 115   91   105    ?BUN 6 - 20 mg/dL 22   20   18     ?Creatinine 0.61 - 1.24 mg/dL 0.87   0.86   0.82    ?Sodium 135 - 145 mmol/L 144   144   143    ?Potassium 3.5 - 5.1 mmol/L 2.9   3.5   3.2    ?Chloride 98 - 111 mmol/L 103   102   105    ?CO2 22 - 32 mmol/L 34   35   31    ?Calcium 8.9 - 10.3 mg/dL 8.6   9.1   8.8    ?Total Protein 6.5 - 8.1 g/dL  5.5      ?Total Bilirubin 0.3 - 1.2 mg/dL 0.3      ?Alkaline Phos 38 - 126 U/L 58      ?AST 15 - 41 U/L 15      ?ALT 0 - 44 U/L 22      ? ? ? ? ? ?Micro Results ?No results found for this or any previous visit (from the past 240 hour(s)). ? ?Radiology Rep

## 2022-02-21 DIAGNOSIS — J441 Chronic obstructive pulmonary disease with (acute) exacerbation: Secondary | ICD-10-CM | POA: Diagnosis not present

## 2022-02-21 LAB — CBC
HCT: 38.6 % — ABNORMAL LOW (ref 39.0–52.0)
Hemoglobin: 11.8 g/dL — ABNORMAL LOW (ref 13.0–17.0)
MCH: 30.8 pg (ref 26.0–34.0)
MCHC: 30.6 g/dL (ref 30.0–36.0)
MCV: 100.8 fL — ABNORMAL HIGH (ref 80.0–100.0)
Platelets: 224 10*3/uL (ref 150–400)
RBC: 3.83 MIL/uL — ABNORMAL LOW (ref 4.22–5.81)
RDW: 14.7 % (ref 11.5–15.5)
WBC: 12.3 10*3/uL — ABNORMAL HIGH (ref 4.0–10.5)
nRBC: 0 % (ref 0.0–0.2)

## 2022-02-21 LAB — BASIC METABOLIC PANEL
Anion gap: 8 (ref 5–15)
BUN: 21 mg/dL — ABNORMAL HIGH (ref 6–20)
CO2: 33 mmol/L — ABNORMAL HIGH (ref 22–32)
Calcium: 8.6 mg/dL — ABNORMAL LOW (ref 8.9–10.3)
Chloride: 96 mmol/L — ABNORMAL LOW (ref 98–111)
Creatinine, Ser: 0.98 mg/dL (ref 0.61–1.24)
GFR, Estimated: >60 mL/min (ref >60)
Glucose, Bld: 139 mg/dL — ABNORMAL HIGH (ref 70–99)
Potassium: 3.6 mmol/L (ref 3.5–5.1)
Sodium: 137 mmol/L (ref 135–145)

## 2022-02-21 MED ORDER — PREDNISONE 10 MG PO TABS: 20.0000 mg | ORAL_TABLET | Freq: Two times a day (BID) | ORAL | 0 refills | Status: AC

## 2022-02-21 MED ORDER — LEVOFLOXACIN 500 MG PO TABS: 500.0000 mg | ORAL_TABLET | Freq: Every day | ORAL | 0 refills | Status: AC

## 2022-02-21 NOTE — Discharge Summary (Signed)
Physician Discharge Summary  ?Tony Sullivan ION:[id number] DOB: 23-Jul-1961 DOA: 02/19/2022 ? ?PCP: [name], FNP ? ?Admit date: 02/19/2022 ?Discharge date: 02/21/2022 ? ?Time spent: 45 minutes ? ?Recommendations for Outpatient Follow-up:  ?Follow-up with primary care physician in 1 to 2 weeks ? ? ?Discharge Diagnoses:  ?Principal Problem: ?  Acute exacerbation of chronic obstructive pulmonary disease (COPD) ([place]) ?Active Problems: ?  CAP (community acquired pneumonia) ?  Leukocytosis ?  Smoker ?  Essential hypertension ?  GERD (gastroesophageal reflux disease) ?  Mixed hyperlipidemia ?  Elevated MCV ?  Glaucoma ?  Pedal edema ? ? ?Discharge Condition: Stable and improved ? ? ?Filed Weights  ? 02/19/22 0928  ?Weight: 65.8 kg  ? ? ?History of present illness:  ?Tony Sullivan is a 61 y.o. 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  He was seen in the ED on 4/20 and chest x-ray done at that time showed baseline appearance of the chest.  CT angiography of chest done showed segmental airway opacification in the left upper lobe.  He was discharged with doxycycline.  Patient states that he was also prescribed prednisone and was asked to follow-up with his pulmonologist (Dr. [name]).  He states that he has only taken 2 days of antibiotics and has not taken the prednisone in 3 days due to accidentally dropping the medication in water with Epsom salt.  He denies headache, fever, nausea, vomiting, abdominal pain.  Patient endorsed bilateral pedal edema and he states that he takes HCTZ for this. ?Patient was admitted from 3/13 to 3/15 due to community-acquired pneumonia. ?  ?ED Course:  ? Intermittently tachypneic, BP was 151/84, and other vital signs were within normal range, O2 sat was 97-100%  on supplemental oxygen via [place] at 3 LPM.  ?Work-up in the ED: showed normal CBC except for leukocytosis and elevated MCV (102, BMP was normal except for bicarb of 35, troponin x2 was negative, BNP was 72.0 ?Chest x-ray showed no evidence of acute cardiopulmonary disease ?He was provided with breathing treatment.  Prednisone was given.  Specialist was asked to admit patient for further evaluation and management. ? ?Hospital Course:  ? ?Patient was admitted started on antibiotics and Solu-Medrol and clinically improved.  He will be discharged again with a prednisone taper and to complete antibiotics.  He is to follow-up with his primary care physician in 1 to 2 weeks and with his pulmonologist routinely.  We have set up home health and physical therapy for him and a nurse. ? ? ?Acute exacerbation of chronic obstructive pulmonary disease (COPD) ([place]) ?Acute exacerbation of COPD ?-Continue supplemental oxygen to maintain O2 sat > 94%  ?-Currently on 3 L of oxygen, satting 99% ?-Discharge on steroid taper and antibiotics ? ?Community-acquired pneumonia ?Complete antibiotics at discharge ? ? ? ?Discharge Exam: ?Vitals:  ? 02/21/22 2440 02/21/22 1027  ?BP:    ?Pulse:    ?Resp:    ?Temp:    ?SpO2: 94% 100%  ? ? ?General: Alert and oriented x4 no apparent distress ?Cardiovascular: Regular rate and rhythm without murmurs rubs or gallops ?Respiratory: Clear to auscultation bilaterally no wheezes rhonchi rales ? ?Discharge Instructions ? ? ?Discharge Instructions   ? ? Diet - low sodium heart healthy   Complete by: As directed ?  ?  Discharge instructions   Complete by: As directed ?  ? Follow-up with your primary care physician in 1 to 2 weeks  ? Increase activity slowly   Complete by: As directed ?  ? ?  ? ?Allergies as of 02/21/2022   ? ?   Reactions  ? Penicillins Anaphylaxis  ? Has patient had a PCN reaction causing immediate rash, facial/tongue/throat swelling, SOB or lightheadedness with hypotension: Yes ?Has patient had a  PCN reaction causing severe rash involving mucus membranes or skin necrosis: No ?Has patient had a PCN reaction that required hospitalization No ?Has patient had a PCN reaction occurring within the last 10 years: No ?If all of the above answers are "NO", then may proceed with Cephalosporin use.  ? Levocetirizine Other (See Comments)  ? Muscle cramps  ? ?  ? ?  ?Medication List  ?  ? ?TAKE these medications   ? ?acetaminophen 325 MG tablet ?Commonly known as: TYLENOL ?Take 2 tablets (650 mg total) by mouth every 6 (six) hours as needed for mild pain (or Fever >/= 101). ?  ?albuterol (2.5 MG/3ML) 0.083% nebulizer solution ?Commonly known as: PROVENTIL ?Take 3 mLs (2.5 mg total) by nebulization every 4 (four) hours as needed for wheezing or shortness of breath. ?  ?albuterol 108 (90 Base) MCG/ACT inhaler ?Commonly known as: VENTOLIN HFA ?Inhale 2 puffs into the lungs every 6 (six) hours as needed for wheezing or shortness of breath. ?  ?atorvastatin 40 MG tablet ?Commonly known as: LIPITOR ?TAKE ONE TABLET BY MOUTH DAILY ?  ?benztropine 0.5 MG tablet ?Commonly known as: COGENTIN ?Take 0.5 mg by mouth at bedtime. ?  ?Breztri Aerosphere 160-9-4.8 MCG/ACT Aero ?Generic drug: Budeson-Glycopyrrol-Formoterol ?Inhale 2 puffs into the lungs in the morning and at bedtime. ?  ?cetirizine 10 MG tablet ?Commonly known as: ZYRTEC ?Take 10 mg by mouth daily. ?  ?dextromethorphan-guaiFENesin 30-600 MG 12hr tablet ?Commonly known as: [place] DM ?Take 1 tablet by mouth 2 (two) times daily as needed for cough. ?  ?diclofenac Sodium 1 % Gel ?Commonly known as: VOLTAREN ?Apply 2 g topically 4 (four) times daily. ?  ?dicyclomine 20 MG tablet ?Commonly known as: BENTYL ?Take 1 tablet (20 mg total) by mouth 2 (two) times daily as needed for spasms. ?  ?dorzolamide 2 % ophthalmic solution ?Commonly known as: TRUSOPT ?Place 1 drop into the left eye 2 (two) times daily. ?  ?doxycycline 100 MG capsule ?Commonly known as: VIBRAMYCIN ?Take 1  capsule (100 mg total) by mouth 2 (two) times daily for 7 days. ?  ?hydrochlorothiazide 25 MG tablet ?Commonly known as: HYDRODIURIL ?Take 0.5 tablets (12.5 mg total) by mouth daily. ?  ?hydrOXYzine 25 MG tablet ?Commonly known as: ATARAX ?Take 1 tablet (25 mg total) by mouth 3 (three) times daily as needed for anxiety or nausea. ?  ?latanoprost 0.005 % ophthalmic solution ?Commonly known as: XALATAN ?Place 1 drop into both eyes at bedtime. ?  ?levofloxacin 500 MG tablet ?Commonly known as: Levaquin ?Take 1 tablet (500 mg total) by mouth daily for 5 days. ?  ?losartan 100 MG tablet ?Commonly known as: COZAAR ?Take 100 mg by mouth daily. ?  ?loxapine 10 MG capsule ?Commonly known as: LOXITANE ?Take 10 mg by mouth at bedtime. ?  ?mirtazapine 30 MG tablet ?Commonly known as: REMERON ?Take 1 tablet (30 mg total) by mouth at bedtime. ?  ?multivitamin with minerals Tabs tablet ?Take 1 tablet by mouth daily. ?  ?OXYGEN ?Inhale 3-4 L into the lungs  as directed. 3-4 liters with sleep and exertion as needed for low oxygen ?  ?pantoprazole 40 MG tablet ?Commonly known as: PROTONIX ?Take 1 tablet (40 mg total) by mouth daily. ?  ?polyethylene glycol 17 g packet ?Commonly known as: MIRALAX / GLYCOLAX ?TAKE 17 GRAMS BY MOUTH DAILY ?What changed: See the new instructions. ?  ?predniSONE 10 MG tablet ?Commonly known as: DELTASONE ?Take 2 tablets (20 mg total) by mouth 2 (two) times daily with a meal for 5 days. ?  ?risperiDONE 2 MG tablet ?Commonly known as: RISPERDAL ?Take 1 tablet (2 mg total) by mouth at bedtime. ?  ?senna-docusate 8.6-50 MG tablet ?Commonly known as: Senokot-S ?Take 2 tablets by mouth at bedtime. ?  ?simethicone 40 MG/0.6ML drops ?Commonly known as: MYLICON ?Take 0.6 mLs (40 mg total) by mouth 4 (four) times daily as needed for flatulence. ?  ?simethicone 80 MG chewable tablet ?Commonly known as: MYLICON ?Chew 1 tablet (80 mg total) by mouth 4 (four) times daily as needed for flatulence. ?  ? ?  ? ?Allergies   ?Allergen Reactions  ? Penicillins Anaphylaxis  ?  Has patient had a PCN reaction causing immediate rash, facial/tongue/throat swelling, SOB or lightheadedness with hypotension: Yes ?Has patient had a PCN re

## 2022-02-21 NOTE — TOC Transition Note (Signed)
Transition of Care (TOC) - CM/SW Discharge Note ? ? ?Patient Details  ?Name: Tony Sullivan ?MRN: [id number] ?Date of Birth: 13-Dec-1960 ? ?Transition of Care (TOC) CM/SW Contact:  ?Shade Flood, LCSW ?Phone Number: ?02/21/2022, 10:29 AM ? ? ?Clinical Narrative:    ? ?Pt stable for dc per MD. Pt states he does not need HH. There are no other TOC needs for dc. ? ?Final next level of care: Home/Self Care ?Barriers to Discharge: Barriers Resolved ? ? ?Patient Goals and CMS Choice ?  ?  ?  ? ?Discharge Placement ?  ?           ?  ?  ?  ?  ? ?Discharge Plan and Services ?  ?  ?           ?  ?  ?  ?  ?  ?  ?  ?  ?  ?  ? ?Social Determinants of Health (SDOH) Interventions ?  ? ? ?Readmission Risk Interventions ? ?  09/08/2019  ?  3:22 PM  ?Readmission Risk Prevention Plan  ?Transportation Screening Complete  ?PCP or Specialist Appt within 3-5 Days Complete  ?[place] or Home Care Consult Complete  ?Social Work Consult for [place] Planning/Counseling Complete  ?Palliative Care Screening Not Applicable  ?Medication Review [employer]) Complete  ? ? ? ? ? ?

## 2022-02-21 NOTE — Progress Notes (Signed)
Nsg Discharge Note ? ?Admit Date:  02/19/2022 ?Discharge date: 02/21/2022 ?  ?[name] to be D/C'd Home per MD order.  AVS completed.  Copy for chart, and copy for patient signed, and dated. ?Patient/caregiver able to verbalize understanding. ? ?Discharge Medication: ?Allergies as of 02/21/2022   ? ?   Reactions  ? Penicillins Anaphylaxis  ? Has patient had a PCN reaction causing immediate rash, facial/tongue/throat swelling, SOB or lightheadedness with hypotension: Yes ?Has patient had a PCN reaction causing severe rash involving mucus membranes or skin necrosis: No ?Has patient had a PCN reaction that required hospitalization No ?Has patient had a PCN reaction occurring within the last 10 years: No ?If all of the above answers are "NO", then may proceed with Cephalosporin use.  ? Levocetirizine Other (See Comments)  ? Muscle cramps  ? ?  ? ?  ?Medication List  ?  ? ?TAKE these medications   ? ?acetaminophen 325 MG tablet ?Commonly known as: TYLENOL ?Take 2 tablets (650 mg total) by mouth every 6 (six) hours as needed for mild pain (or Fever >/= 101). ?  ?albuterol (2.5 MG/3ML) 0.083% nebulizer solution ?Commonly known as: PROVENTIL ?Take 3 mLs (2.5 mg total) by nebulization every 4 (four) hours as needed for wheezing or shortness of breath. ?  ?albuterol 108 (90 Base) MCG/ACT inhaler ?Commonly known as: VENTOLIN HFA ?Inhale 2 puffs into the lungs every 6 (six) hours as needed for wheezing or shortness of breath. ?  ?atorvastatin 40 MG tablet ?Commonly known as: LIPITOR ?TAKE ONE TABLET BY MOUTH DAILY ?  ?benztropine 0.5 MG tablet ?Commonly known as: COGENTIN ?Take 0.5 mg by mouth at bedtime. ?  ?Breztri Aerosphere 160-9-4.8 MCG/ACT Aero ?Generic drug: Budeson-Glycopyrrol-Formoterol ?Inhale 2 puffs into the lungs in the morning and at bedtime. ?  ?cetirizine 10 MG tablet ?Commonly known as: ZYRTEC ?Take 10 mg by mouth daily. ?  ?dextromethorphan-guaiFENesin 30-600 MG 12hr tablet ?Commonly known as: [place]  DM ?Take 1 tablet by mouth 2 (two) times daily as needed for cough. ?  ?diclofenac Sodium 1 % Gel ?Commonly known as: VOLTAREN ?Apply 2 g topically 4 (four) times daily. ?  ?dicyclomine 20 MG tablet ?Commonly known as: BENTYL ?Take 1 tablet (20 mg total) by mouth 2 (two) times daily as needed for spasms. ?  ?dorzolamide 2 % ophthalmic solution ?Commonly known as: TRUSOPT ?Place 1 drop into the left eye 2 (two) times daily. ?  ?doxycycline 100 MG capsule ?Commonly known as: VIBRAMYCIN ?Take 1 capsule (100 mg total) by mouth 2 (two) times daily for 7 days. ?  ?hydrochlorothiazide 25 MG tablet ?Commonly known as: HYDRODIURIL ?Take 0.5 tablets (12.5 mg total) by mouth daily. ?  ?hydrOXYzine 25 MG tablet ?Commonly known as: ATARAX ?Take 1 tablet (25 mg total) by mouth 3 (three) times daily as needed for anxiety or nausea. ?  ?latanoprost 0.005 % ophthalmic solution ?Commonly known as: XALATAN ?Place 1 drop into both eyes at bedtime. ?  ?levofloxacin 500 MG tablet ?Commonly known as: Levaquin ?Take 1 tablet (500 mg total) by mouth daily for 5 days. ?  ?losartan 100 MG tablet ?Commonly known as: COZAAR ?Take 100 mg by mouth daily. ?  ?loxapine 10 MG capsule ?Commonly known as: LOXITANE ?Take 10 mg by mouth at bedtime. ?  ?mirtazapine 30 MG tablet ?Commonly known as: REMERON ?Take 1 tablet (30 mg total) by mouth at bedtime. ?  ?multivitamin with minerals Tabs tablet ?Take 1 tablet by mouth daily. ?  ?OXYGEN ?Inhale  3-4 L into the lungs as directed. 3-4 liters with sleep and exertion as needed for low oxygen ?  ?pantoprazole 40 MG tablet ?Commonly known as: PROTONIX ?Take 1 tablet (40 mg total) by mouth daily. ?  ?polyethylene glycol 17 g packet ?Commonly known as: MIRALAX / GLYCOLAX ?TAKE 17 GRAMS BY MOUTH DAILY ?What changed: See the new instructions. ?  ?predniSONE 10 MG tablet ?Commonly known as: DELTASONE ?Take 2 tablets (20 mg total) by mouth 2 (two) times daily with a meal for 5 days. ?  ?risperiDONE 2 MG  tablet ?Commonly known as: RISPERDAL ?Take 1 tablet (2 mg total) by mouth at bedtime. ?  ?senna-docusate 8.6-50 MG tablet ?Commonly known as: Senokot-S ?Take 2 tablets by mouth at bedtime. ?  ?simethicone 40 MG/0.6ML drops ?Commonly known as: MYLICON ?Take 0.6 mLs (40 mg total) by mouth 4 (four) times daily as needed for flatulence. ?  ?simethicone 80 MG chewable tablet ?Commonly known as: MYLICON ?Chew 1 tablet (80 mg total) by mouth 4 (four) times daily as needed for flatulence. ?  ? ?  ? ? ?Discharge Assessment: ?Vitals:  ? 02/21/22 3664 02/21/22 4034  ?BP:    ?Pulse:    ?Resp:    ?Temp:    ?SpO2: 94% 100%  ? Skin clean, dry and intact without evidence of skin break down, no evidence of skin tears noted. ?IV catheter discontinued intact. Site without signs and symptoms of complications - no redness or edema noted at insertion site, patient denies c/o pain - only slight tenderness at site.  Dressing with slight pressure applied. ? ?D/c Instructions-Education: ?Discharge instructions given to patient/family with verbalized understanding. ?D/c education completed with patient/family including follow up instructions, medication list, d/c activities limitations if indicated, with other d/c instructions as indicated by MD - patient able to verbalize understanding, all questions fully answered. ?Patient instructed to return to ED, call 911, or call MD for any changes in condition.  ?Patient escorted via [place], and D/C home via private auto. PT was educated on the importance of picking up medications and the need to stop smoking  ? ?Clovis Fredrickson, LPN ?7/42/5956 38:75 AM  ?

## 2022-02-26 ENCOUNTER — Inpatient Hospital Stay: Payer: Medicaid Other | Admitting: Internal Medicine

## 2022-02-27 ENCOUNTER — Other Ambulatory Visit: Payer: Self-pay

## 2022-02-27 ENCOUNTER — Inpatient Hospital Stay (HOSPITAL_COMMUNITY)
Admission: EM | Admit: 2022-02-27 | Discharge: 2022-03-02 | DRG: 191 | Disposition: A | Discharge status: Home / Self Care | Payer: Medicaid Other | Attending: Internal Medicine | Admitting: Internal Medicine

## 2022-02-27 ENCOUNTER — Encounter (HOSPITAL_COMMUNITY): Payer: Self-pay | Admitting: Internal Medicine

## 2022-02-27 ENCOUNTER — Emergency Department (HOSPITAL_COMMUNITY): Payer: Medicaid Other

## 2022-02-27 DIAGNOSIS — Z888 Allergy status to other drugs, medicaments and biological substances status: Secondary | ICD-10-CM | POA: Diagnosis not present

## 2022-02-27 DIAGNOSIS — E782 Mixed hyperlipidemia: Secondary | ICD-10-CM | POA: Diagnosis present

## 2022-02-27 DIAGNOSIS — J9611 Chronic respiratory failure with hypoxia: Secondary | ICD-10-CM | POA: Diagnosis present

## 2022-02-27 DIAGNOSIS — E876 Hypokalemia: Secondary | ICD-10-CM | POA: Diagnosis present

## 2022-02-27 DIAGNOSIS — J441 Chronic obstructive pulmonary disease with (acute) exacerbation: Principal | ICD-10-CM

## 2022-02-27 DIAGNOSIS — I10 Essential (primary) hypertension: Secondary | ICD-10-CM | POA: Diagnosis present

## 2022-02-27 DIAGNOSIS — R0789 Other chest pain: Secondary | ICD-10-CM | POA: Diagnosis not present

## 2022-02-27 DIAGNOSIS — F17210 Nicotine dependence, cigarettes, uncomplicated: Secondary | ICD-10-CM | POA: Diagnosis present

## 2022-02-27 DIAGNOSIS — Z79899 Other long term (current) drug therapy: Secondary | ICD-10-CM | POA: Diagnosis not present

## 2022-02-27 DIAGNOSIS — Z7951 Long term (current) use of inhaled steroids: Secondary | ICD-10-CM

## 2022-02-27 DIAGNOSIS — Z880 Allergy status to penicillin: Secondary | ICD-10-CM | POA: Diagnosis not present

## 2022-02-27 DIAGNOSIS — K219 Gastro-esophageal reflux disease without esophagitis: Secondary | ICD-10-CM | POA: Diagnosis present

## 2022-02-27 DIAGNOSIS — F419 Anxiety disorder, unspecified: Secondary | ICD-10-CM | POA: Diagnosis present

## 2022-02-27 DIAGNOSIS — R079 Chest pain, unspecified: Secondary | ICD-10-CM

## 2022-02-27 DIAGNOSIS — Z9981 Dependence on supplemental oxygen: Secondary | ICD-10-CM | POA: Diagnosis not present

## 2022-02-27 DIAGNOSIS — Z720 Tobacco use: Secondary | ICD-10-CM

## 2022-02-27 LAB — CBC
HCT: 50.2 % (ref 39.0–52.0)
Hemoglobin: 15.2 g/dL (ref 13.0–17.0)
MCH: 31.1 pg (ref 26.0–34.0)
MCHC: 30.3 g/dL (ref 30.0–36.0)
MCV: 102.9 fL — ABNORMAL HIGH (ref 80.0–100.0)
Platelets: 263 10*3/uL (ref 150–400)
RBC: 4.88 MIL/uL (ref 4.22–5.81)
RDW: 15.5 % (ref 11.5–15.5)
WBC: 17.8 10*3/uL — ABNORMAL HIGH (ref 4.0–10.5)
nRBC: 0 % (ref 0.0–0.2)

## 2022-02-27 LAB — BASIC METABOLIC PANEL
Anion gap: 9 (ref 5–15)
BUN: 27 mg/dL — ABNORMAL HIGH (ref 6–20)
CO2: 33 mmol/L — ABNORMAL HIGH (ref 22–32)
Calcium: 9.1 mg/dL (ref 8.9–10.3)
Chloride: 100 mmol/L (ref 98–111)
Creatinine, Ser: 1.18 mg/dL (ref 0.61–1.24)
GFR, Estimated: >60 mL/min (ref >60)
Glucose, Bld: 91 mg/dL (ref 70–99)
Potassium: 3.4 mmol/L — ABNORMAL LOW (ref 3.5–5.1)
Sodium: 142 mmol/L (ref 135–145)

## 2022-02-27 LAB — TROPONIN I (HIGH SENSITIVITY)
Troponin I (High Sensitivity): 22 ng/L — ABNORMAL HIGH (ref <18)
Troponin I (High Sensitivity): 17 ng/L (ref <18)

## 2022-02-27 IMAGING — DX DG CHEST 1V PORT
2 series · 2 of 2 positions shown · non-contrast
Comparison: [DATE]

CLINICAL DATA: Worsening shortness of breath.  History of COPD.

EXAM:
PORTABLE CHEST 1 VIEW

[chest ap (1 of 2)]
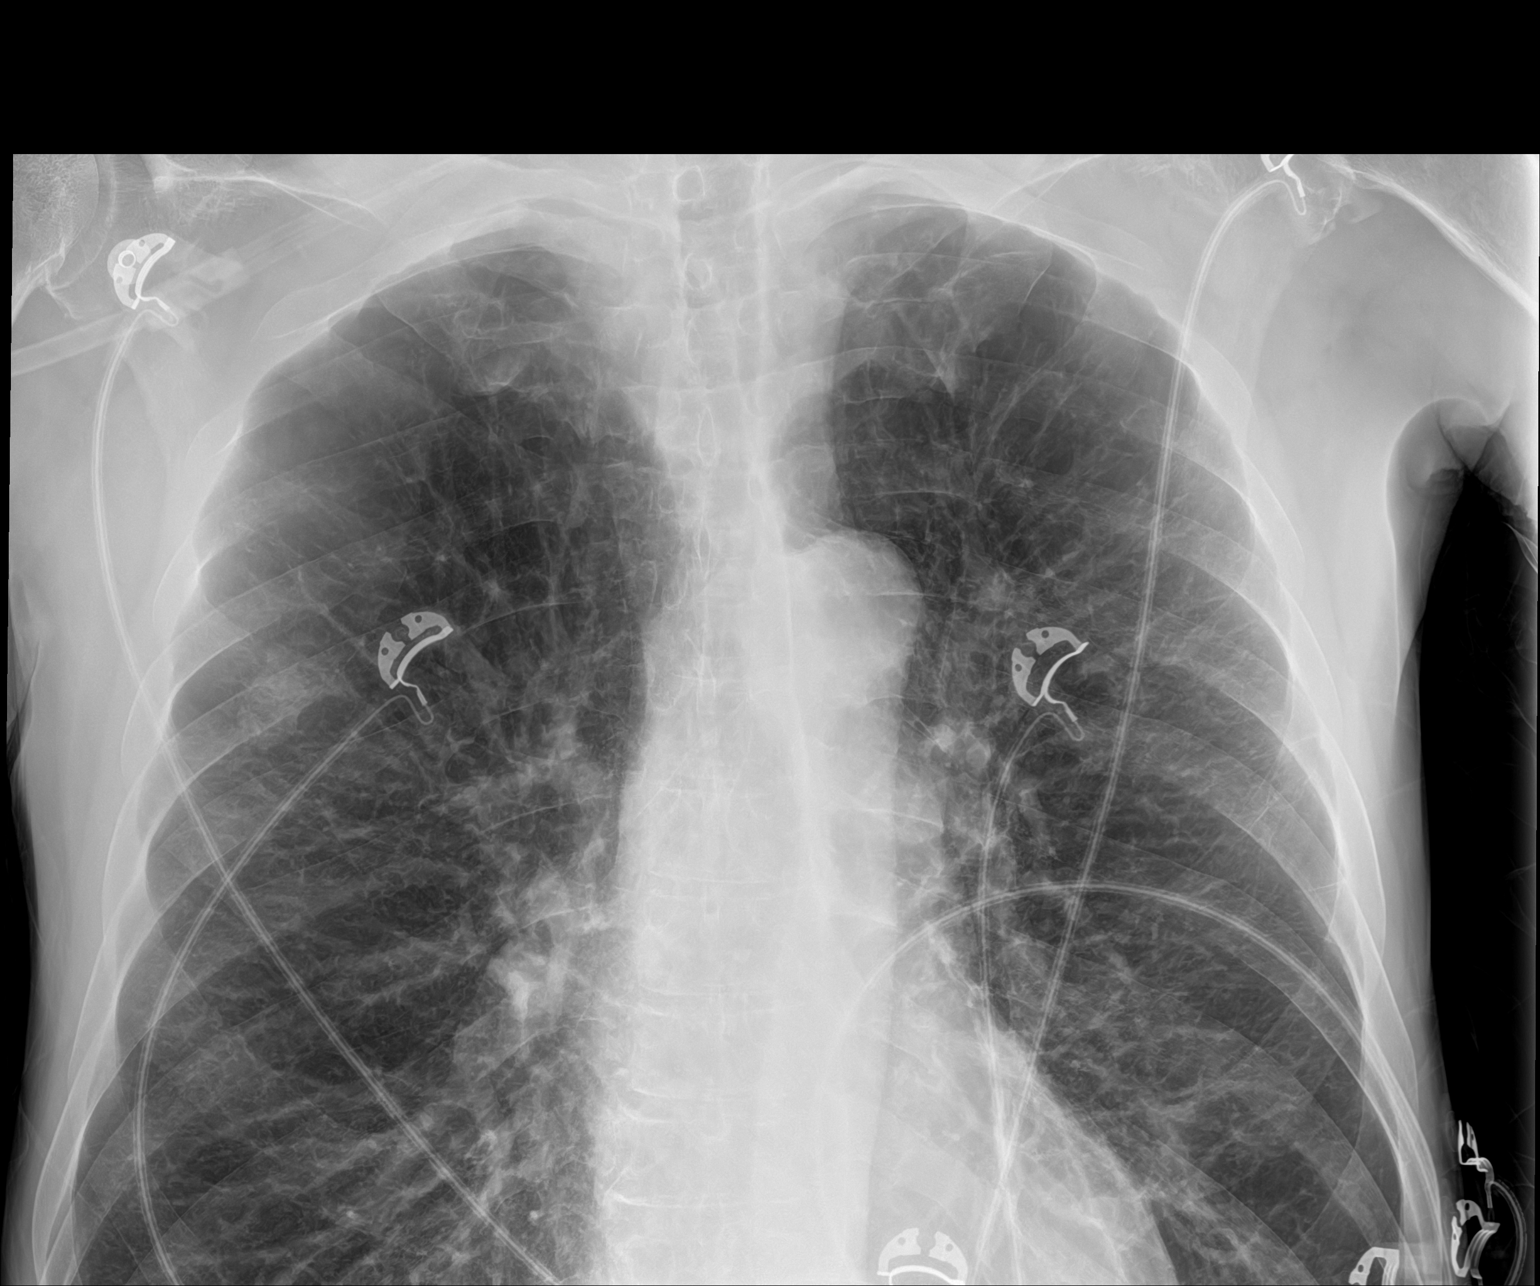

[chest ap (2 of 2)]
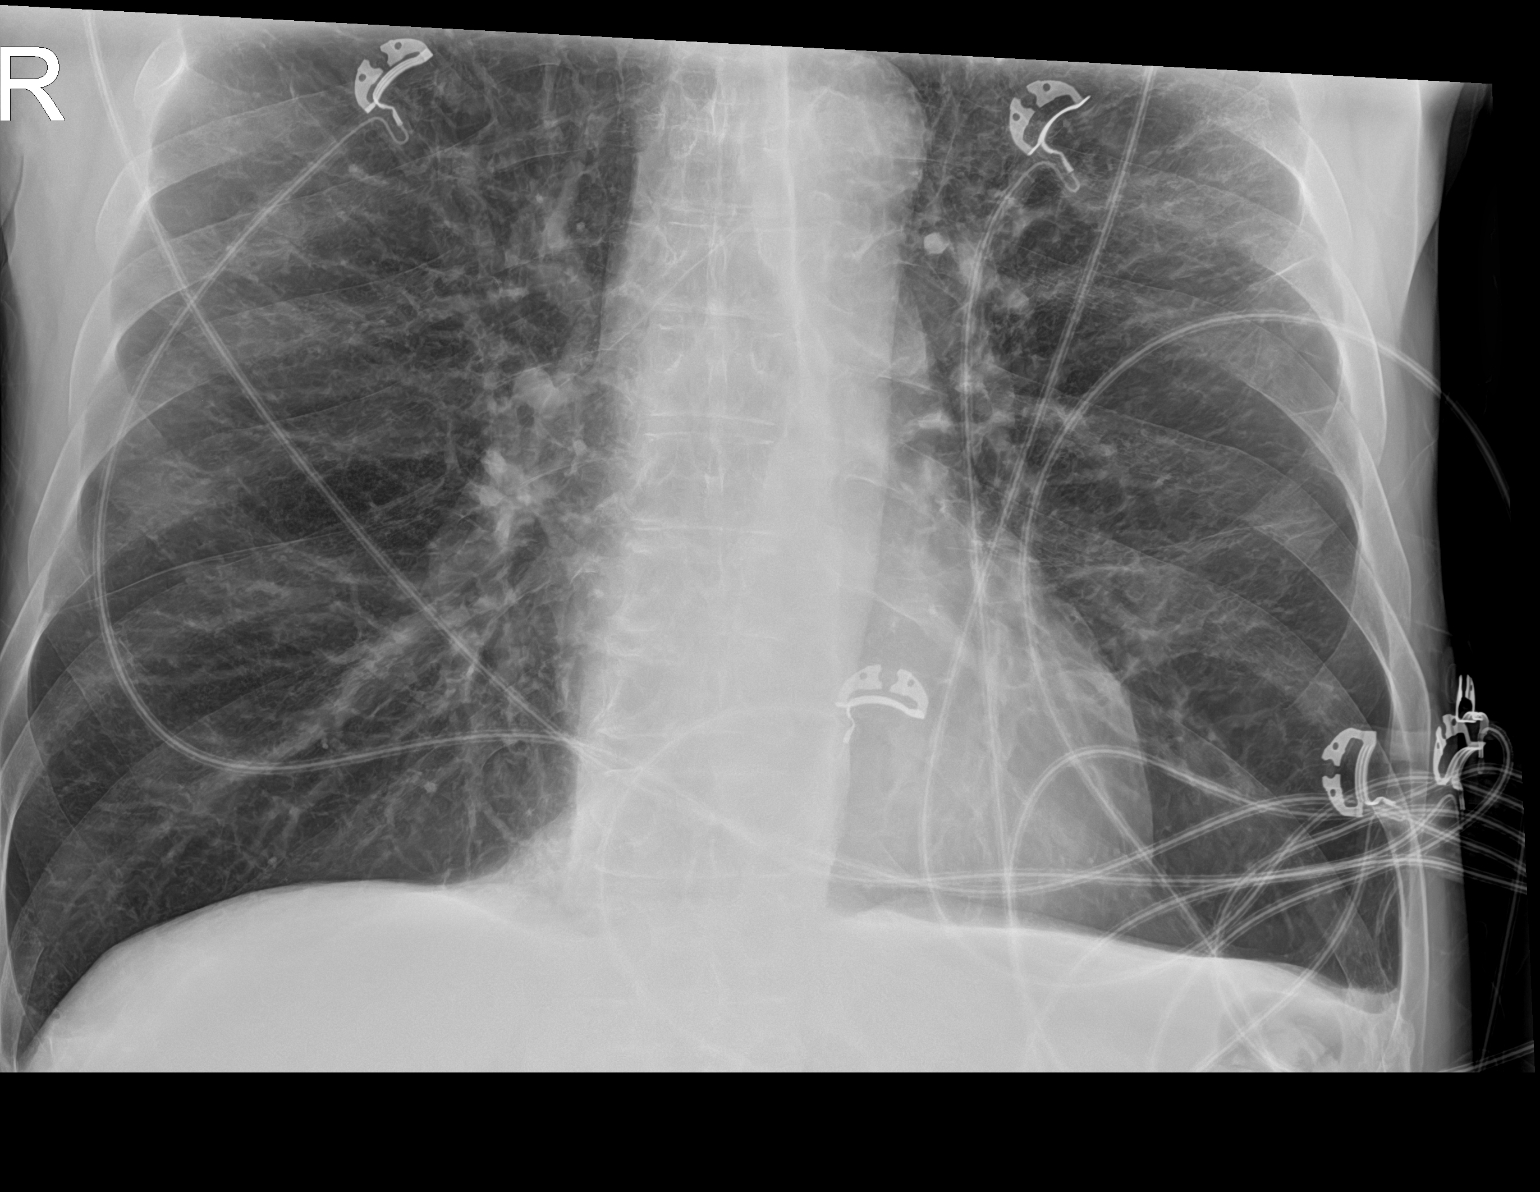

[2 of 2 positions shown; findings below may reference images not displayed]

FINDINGS: [KB] hours. Lungs are hyperexpanded. Stable trace effusion versus
scarring in the left costophrenic angle. Interstitial markings are
diffusely coarsened with chronic features. The cardiopericardial
silhouette is within normal limits for size. Telemetry leads overlie
the chest.
IMPRESSION: 1. Hyperexpansion without acute cardiopulmonary findings.

## 2022-02-27 MED ORDER — LATANOPROST 0.005 % OP SOLN
1.0000 [drp] | Freq: Every day | OPHTHALMIC | Status: DC
  Administered 2022-02-27 – 2022-03-01 (×3): 1 [drp] via OPHTHALMIC
  Filled 2022-02-27: qty 2.5

## 2022-02-27 MED ORDER — BENZTROPINE MESYLATE 1 MG PO TABS
0.5000 mg | ORAL_TABLET | Freq: Every day | ORAL | Status: DC
  Filled 2022-02-27 (×2): qty 1

## 2022-02-27 MED ORDER — ADULT MULTIVITAMIN W/MINERALS CH
1.0000 | ORAL_TABLET | Freq: Every day | ORAL | Status: DC
  Administered 2022-02-27 – 2022-03-02 (×4): 1 via ORAL
  Filled 2022-02-27 (×4): qty 1

## 2022-02-27 MED ORDER — NITROGLYCERIN 0.4 MG SL SUBL
0.4000 mg | SUBLINGUAL_TABLET | SUBLINGUAL | Status: DC | PRN
  Administered 2022-02-27: 0.4 mg via SUBLINGUAL
  Filled 2022-02-27: qty 1

## 2022-02-27 MED ORDER — ASPIRIN 325 MG PO TABS
325.0000 mg | ORAL_TABLET | Freq: Once | ORAL | Status: AC
  Administered 2022-02-27: 325 mg via ORAL
  Filled 2022-02-27: qty 1

## 2022-02-27 MED ORDER — HYDROXYZINE HCL 25 MG PO TABS
25.0000 mg | ORAL_TABLET | Freq: Three times a day (TID) | ORAL | Status: DC | PRN
  Administered 2022-02-27 – 2022-03-01 (×2): 25 mg via ORAL
  Filled 2022-02-27 (×2): qty 1

## 2022-02-27 MED ORDER — RISPERIDONE 1 MG PO TABS
2.0000 mg | ORAL_TABLET | Freq: Every day | ORAL | Status: DC
  Filled 2022-02-27 (×2): qty 2

## 2022-02-27 MED ORDER — METHYLPREDNISOLONE SODIUM SUCC 125 MG IJ SOLR
125.0000 mg | Freq: Once | INTRAMUSCULAR | Status: AC
  Administered 2022-02-27: 125 mg via INTRAVENOUS
  Filled 2022-02-27: qty 2

## 2022-02-27 MED ORDER — POLYETHYLENE GLYCOL 3350 17 G PO PACK
17.0000 g | PACK | Freq: Every day | ORAL | Status: DC | PRN
  Administered 2022-03-02: 17 g via ORAL
  Filled 2022-02-27: qty 1

## 2022-02-27 MED ORDER — PANTOPRAZOLE SODIUM 40 MG PO TBEC
40.0000 mg | DELAYED_RELEASE_TABLET | Freq: Every day | ORAL | Status: DC
  Administered 2022-02-28 – 2022-03-02 (×3): 40 mg via ORAL
  Filled 2022-02-27 (×4): qty 1

## 2022-02-27 MED ORDER — ALBUTEROL SULFATE (2.5 MG/3ML) 0.083% IN NEBU
2.5000 mg | INHALATION_SOLUTION | Freq: Once | RESPIRATORY_TRACT | Status: AC
  Administered 2022-02-27: 2.5 mg via RESPIRATORY_TRACT

## 2022-02-27 MED ORDER — IPRATROPIUM-ALBUTEROL 0.5-2.5 (3) MG/3ML IN SOLN
3.0000 mL | Freq: Four times a day (QID) | RESPIRATORY_TRACT | Status: AC
  Administered 2022-02-27 – 2022-03-01 (×8): 3 mL via RESPIRATORY_TRACT
  Filled 2022-02-27 (×8): qty 3

## 2022-02-27 MED ORDER — ALBUTEROL SULFATE (2.5 MG/3ML) 0.083% IN NEBU
INHALATION_SOLUTION | RESPIRATORY_TRACT | Status: AC
  Filled 2022-02-27: qty 3

## 2022-02-27 MED ORDER — IPRATROPIUM-ALBUTEROL 0.5-2.5 (3) MG/3ML IN SOLN
3.0000 mL | Freq: Once | RESPIRATORY_TRACT | Status: AC
  Administered 2022-02-27: 3 mL via RESPIRATORY_TRACT

## 2022-02-27 MED ORDER — METOCLOPRAMIDE HCL 5 MG/ML IJ SOLN
10.0000 mg | Freq: Once | INTRAMUSCULAR | Status: AC
  Administered 2022-02-27: 10 mg via INTRAVENOUS
  Filled 2022-02-27: qty 2

## 2022-02-27 MED ORDER — LOXAPINE SUCCINATE 5 MG PO CAPS
10.0000 mg | ORAL_CAPSULE | Freq: Every day | ORAL | Status: DC
  Filled 2022-02-27: qty 2
  Filled 2022-02-27: qty 1
  Filled 2022-02-27 (×2): qty 2
  Filled 2022-02-27: qty 1

## 2022-02-27 MED ORDER — HYDROXYZINE HCL 25 MG PO TABS
25.0000 mg | ORAL_TABLET | Freq: Once | ORAL | Status: AC
Start: 2022-02-27 — End: 2022-02-27
  Administered 2022-02-27: 25 mg via ORAL
  Filled 2022-02-27: qty 1

## 2022-02-27 MED ORDER — LOSARTAN POTASSIUM 50 MG PO TABS
100.0000 mg | ORAL_TABLET | Freq: Every day | ORAL | Status: DC
  Administered 2022-02-28 – 2022-03-02 (×3): 100 mg via ORAL
  Filled 2022-02-27 (×3): qty 2

## 2022-02-27 MED ORDER — BUDESONIDE 0.5 MG/2ML IN SUSP
0.5000 mg | Freq: Two times a day (BID) | RESPIRATORY_TRACT | Status: DC
  Administered 2022-02-27 – 2022-03-02 (×5): 0.5 mg via RESPIRATORY_TRACT
  Filled 2022-02-27 (×6): qty 2

## 2022-02-27 MED ORDER — IPRATROPIUM-ALBUTEROL 0.5-2.5 (3) MG/3ML IN SOLN
RESPIRATORY_TRACT | Status: AC
  Filled 2022-02-27: qty 3

## 2022-02-27 MED ORDER — METHYLPREDNISOLONE SODIUM SUCC 40 MG IJ SOLR
40.0000 mg | Freq: Two times a day (BID) | INTRAMUSCULAR | Status: DC
  Administered 2022-02-27 – 2022-03-02 (×6): 40 mg via INTRAVENOUS
  Filled 2022-02-27 (×6): qty 1

## 2022-02-27 MED ORDER — DORZOLAMIDE HCL 2 % OP SOLN: 1.0000 [drp] | Freq: Two times a day (BID) | OPHTHALMIC | Status: DC

## 2022-02-27 MED ORDER — ENOXAPARIN SODIUM 40 MG/0.4ML IJ SOSY
40.0000 mg | PREFILLED_SYRINGE | INTRAMUSCULAR | Status: DC
  Administered 2022-02-27 – 2022-03-01 (×3): 40 mg via SUBCUTANEOUS
  Filled 2022-02-27 (×2): qty 0.4

## 2022-02-27 MED ORDER — ATORVASTATIN CALCIUM 40 MG PO TABS
40.0000 mg | ORAL_TABLET | Freq: Every day | ORAL | Status: DC
  Administered 2022-02-28 – 2022-03-02 (×3): 40 mg via ORAL
  Filled 2022-02-27 (×3): qty 1

## 2022-02-27 NOTE — ED Notes (Signed)
Pt req graham crackers and soda. Ok per [employer]  ?

## 2022-02-27 NOTE — ED Notes (Signed)
Dr. Carles Collet in room.  ?

## 2022-02-27 NOTE — H&P (Incomplete)
?History and Physical  ? ? ?Patient: Tony Sullivan [id number] DOB: December 11, 1960 ?DOA: 02/27/2022 ?DOS: the patient was seen and examined on 02/27/2022 ?PCP: [name], FNP  ?Patient coming from: {Point_of_Origin:26777} ? ?Chief Complaint:  ?Chief Complaint  ?Patient presents with  ? Shortness of Breath  ? ?HPI: Tony Sullivan is a 61 y.o. male with medical history significant of ***  ?Review of Systems: {ROS_Text:26778} ?Past Medical History:  ?Diagnosis Date  ? Anxiety   ? Arthritis   ? COPD (chronic obstructive pulmonary disease) ([place]) 2004  ? diagnosed in 2004, no PFT's to date.  Started on home O2 12/2013, after found to be desatting at PCP's office, and referred to pulmonology.  ? Depression   ? GERD (gastroesophageal reflux disease)   ? High blood pressure   ? Prediabetes   ? Seasonal allergies   ? Substance abuse ([place])   ? Tobacco dependence   ? ?Past Surgical History:  ?Procedure Laterality Date  ? ANTERIOR CERVICAL DECOMP/DISCECTOMY FUSION N/A 09/06/2019  ? Procedure: Anterior Cervical Discecectomy Fusion - Cerivcal Five-Cervical Six;  Surgeon: Kary Kos, MD;  Location: [facility];  Service: Neurosurgery;  Laterality: N/A;  Anterior Cervical Discecectomy Fusion - Cerivcal Five-Cervical Six  ? ANTERIOR CERVICAL DISCECTOMY  09/06/2019  ? APPENDECTOMY  1980  ? COLONOSCOPY WITH PROPOFOL N/A 12/10/2016  ? Procedure: COLONOSCOPY WITH PROPOFOL;  Surgeon: [name], MD;  Location: [facility];  Service: Endoscopy;  Laterality: N/A;  ? HIP SURGERY Right   ? SHOULDER ARTHROSCOPY Right   ? ?Social History:  reports that he quit smoking about 4 months ago. His smoking use included cigarettes. He has a 9.00 pack-year smoking history. He has never used smokeless tobacco. He reports that he does not currently use alcohol after a past usage of about 14.0 - 21.0 standard drinks per week. He reports current drug use. Drugs: Marijuana and Cocaine. ? ?Allergies  ?Allergen Reactions  ? Penicillins Anaphylaxis  ?  Has patient  had a PCN reaction causing immediate rash, facial/tongue/throat swelling, SOB or lightheadedness with hypotension: Yes ?Has patient had a PCN reaction causing severe rash involving mucus membranes or skin necrosis: No ?Has patient had a PCN reaction that required hospitalization No ?Has patient had a PCN reaction occurring within the last 10 years: No ?If all of the above answers are "NO", then may proceed with Cephalosporin use. ?  ? Levocetirizine Other (See Comments)  ?  Muscle cramps  ? ? ?Family History  ?Problem Relation Age of Onset  ? Emphysema Mother   ? Allergies Mother   ?     "everyone in family"  ? Asthma Mother   ?     "everyone in family"  ? Heart disease Mother   ? Clotting disorder Mother   ? Cancer Mother   ? Emphysema Father   ? Allergies Father   ? Allergies Son   ? Seizures Brother   ? Diabetes Brother   ? ? ?Prior to Admission medications   ?Medication Sig Start Date End Date Taking? Authorizing Provider  ?acetaminophen (TYLENOL) 325 MG tablet Take 2 tablets (650 mg total) by mouth every 6 (six) hours as needed for mild pain (or Fever >/= 101). 10/30/21  Yes Emokpae, Courage, MD  ?albuterol (PROVENTIL) (2.5 MG/3ML) 0.083% nebulizer solution Take 3 mLs (2.5 mg total) by nebulization every 4 (four) hours as needed for wheezing or shortness of breath. 12/12/21 12/12/22 Yes [name], MD  ?albuterol (VENTOLIN HFA) 108 (90 Base)  MCG/ACT inhaler Inhale 2 puffs into the lungs every 6 (six) hours as needed for wheezing or shortness of breath. 01/09/22  Yes Shah, Pratik D, DO  ?atorvastatin (LIPITOR) 40 MG tablet TAKE ONE TABLET BY MOUTH DAILY ?Patient taking differently: Take 40 mg by mouth daily. 07/06/20  Yes [name], MD  ?benztropine (COGENTIN) 0.5 MG tablet Take 0.5 mg by mouth at bedtime.   Yes [provider]  ?Budeson-Glycopyrrol-Formoterol (BREZTRI AEROSPHERE) 160-9-4.8 MCG/ACT AERO Inhale 2 puffs into the lungs in the morning and at bedtime. 12/12/21  Yes [name], MD   ?cetirizine (ZYRTEC) 10 MG tablet Take 10 mg by mouth daily.   Yes [provider]  ?diclofenac Sodium (VOLTAREN) 1 % GEL Apply 2 g topically 4 (four) times daily.   Yes [provider]  ?dorzolamide (TRUSOPT) 2 % ophthalmic solution Place 1 drop into the left eye 2 (two) times daily. 12/12/20  Yes [provider]  ?hydrochlorothiazide (HYDRODIURIL) 25 MG tablet Take 0.5 tablets (12.5 mg total) by mouth daily. 10/30/21  Yes [name], MD  ?hydrOXYzine (ATARAX) 25 MG tablet Take 1 tablet (25 mg total) by mouth 3 (three) times daily as needed for anxiety or nausea. 10/30/21  Yes Emokpae, Courage, MD  ?latanoprost (XALATAN) 0.005 % ophthalmic solution Place 1 drop into both eyes at bedtime. 05/24/19  Yes [provider]  ?losartan (COZAAR) 100 MG tablet Take 100 mg by mouth daily.   Yes [provider]  ?loxapine (LOXITANE) 10 MG capsule Take 10 mg by mouth at bedtime.    Yes [provider]  ?mirtazapine (REMERON) 30 MG tablet Take 1 tablet (30 mg total) by mouth at bedtime. 10/30/21  Yes [name], MD  ?Multiple Vitamin (MULTIVITAMIN WITH MINERALS) TABS tablet Take 1 tablet by mouth daily. 10/31/21  Yes Emokpae, Courage, MD  ?OXYGEN Inhale 3-4 L into the lungs as directed. 3-4 liters with sleep and exertion as needed for low oxygen   Yes [provider]  ?pantoprazole (PROTONIX) 40 MG tablet Take 1 tablet (40 mg total) by mouth daily. 10/30/21  Yes Emokpae, Courage, MD  ?polyethylene glycol (MIRALAX / GLYCOLAX) 17 g packet TAKE 17 GRAMS BY MOUTH DAILY ?Patient taking differently: Take 17 g by mouth daily as needed for mild constipation. 03/09/20  Yes [name], MD  ?risperiDONE (RISPERDAL) 2 MG tablet Take 1 tablet (2 mg total) by mouth at bedtime. 10/30/21  Yes Emokpae, Courage, MD  ?senna-docusate (SENOKOT-S) 8.6-50 MG tablet Take 2 tablets by mouth at bedtime. 10/30/21  Yes Emokpae, Courage, MD  ?dextromethorphan-guaiFENesin (MUCINEX DM) 30-600 MG  12hr tablet Take 1 tablet by mouth 2 (two) times daily as needed for cough. ?Patient not taking: Reported on 02/19/2022 01/13/22   [name], MD  ?dicyclomine (BENTYL) 20 MG tablet Take 1 tablet (20 mg total) by mouth 2 (two) times daily as needed for spasms. ?Patient not taking: Reported on 02/27/2022 12/15/21   [name], PA-C  ?simethicone [facility]) 40 KC/1.2XN drops Take 0.6 mLs (40 mg total) by mouth 4 (four) times daily as needed for flatulence. ?Patient not taking: Reported on 02/27/2022 10/17/21   Orpah Greek, MD  ?simethicone (MYLICON) 80 MG chewable tablet Chew 1 tablet (80 mg total) by mouth 4 (four) times daily as needed for flatulence. ?Patient not taking: Reported on 02/03/2022 01/09/22   [name], DO  ? ? ?Physical Exam: ?Vitals:  ? 02/27/22 1600 02/27/22 1630 02/27/22 2046 02/27/22 2102  ?BP: Marland Kitchen)  159/94 121/79  (!) 182/109  ?Pulse:  68  79  ?Resp: (!) 22 13  19   ?Temp:  98 ?F (36.7 ?C)  98 ?F (36.7 ?C)  ?TempSrc:  Oral    ?SpO2:  100% 96% 99%  ?Weight:  63.8 kg    ?Height:  5\' 11"  (1.803 m)    ? ?*** ?Data Reviewed: ?{Tip this will not be part of the note when signed- Document your independent interpretation of telemetry tracing, EKG, lab, Radiology test or any other diagnostic tests. Add any new diagnostic test ordered today. ?(Optional):26781} ?{Results:26384} ? ?Assessment and Plan: ?COPD exacerbation ([place]) ?Continue DuoNebs ?Start Pulmicort ?Start Brovana  ?Continue IV Solu-Medrol ? ? ?Hypokalemia ?Replete ?Check magnesium ? ?Mixed hyperlipidemia ?Continue statin ? ?Atypical chest pain ?Troponins unremarkable ?Echocardiogram ?EKG with nonspecific T wave changes ? ?Essential hypertension ?Continue losartan ? ?Chronic respiratory failure with hypoxia (HCC) ?Chronically on 3 L ? ?Tobacco abuse ?Tobacco cessation discussed ? ? ? ? ? Advance Care Planning:   Code Status: Full Code *** ? ?Consults: *** ? ?Family Communication: *** ? ?Severity of  Illness: ?{Observation/Inpatient:21159} ? ?Author: Bernadette Hoit, DO ?02/27/2022 9:46 PM ? ?For on call review [email or web address].  ?

## 2022-02-27 NOTE — ED Notes (Signed)
Report attempted 

## 2022-02-27 NOTE — Assessment & Plan Note (Addendum)
Troponins unremarkable ?Echo--EF 60-65%, no WMA, G1DD ?EKG with nonspecific T wave changes ?

## 2022-02-27 NOTE — Assessment & Plan Note (Addendum)
Replete ?Check magnesium--2.5 ?

## 2022-02-27 NOTE — ED Triage Notes (Signed)
Patient via EMS for shortness of breath. ?

## 2022-02-27 NOTE — Assessment & Plan Note (Signed)
Continue statin. 

## 2022-02-27 NOTE — ED Notes (Signed)
Tried to ambulate pt. Pt refused  ?

## 2022-02-27 NOTE — Assessment & Plan Note (Addendum)
Chronically on 3 L ?Remained stable on 3L through the hospitalization ?

## 2022-02-27 NOTE — Assessment & Plan Note (Signed)
Tobacco cessation discussed 

## 2022-02-27 NOTE — ED Notes (Signed)
Got pt a urinal ?

## 2022-02-27 NOTE — Assessment & Plan Note (Signed)
Continue losartan. 

## 2022-02-27 NOTE — Assessment & Plan Note (Addendum)
Continue DuoNebs ?continue Pulmicort ?continue Brovana  ?Continue IV Solu-Medrol>>d/c home with prednisone taper over 2 weeks ?Add mucinex ?Started yupelri ?RN respiratory reporting pt has been occasionally refusing meds ? ?

## 2022-02-27 NOTE — ED Provider Notes (Signed)
?[facility] ?Provider Note ? ? ?CSN: [id number] ?Arrival date & time: 02/27/22  0751 ? ?  ? ?History ? ?Chief Complaint  ?Patient presents with  ? Shortness of Breath  ? ? ?Tony Sullivan is a 61 y.o. male with a history including COPD who continues to smoke, GERD, polysubstance abuse hyperlipidemia who was recently discharged from here on April 27 where he was treated for COPD exacerbation in association with community-acquired pneumonia.  He states he did not feel well the day of his discharge and thinks he was discharged too early, stating he has remained short of breath constantly since coming home from the hospital.  He reports copious sputum production, clear, sometimes having difficulty expectorating.  He also reports development of midsternal chest pain which is sharp and started yesterday.  It has been constant, it is associated with nausea without vomiting, denies diaphoresis, palpitations.  He reports using his home nebulizer machine 4 times prior to arrival this morning without improvement in his shortness of breath.  He has been afebrile.  He is currently taking prednisone stating he takes 20 mg twice a day and he completed the Levaquin that he was also prescribed. ? ?The history is provided by the patient.  ? ?  ? ?Home Medications ?Prior to Admission medications   ?Medication Sig Start Date End Date Taking? Authorizing Provider  ?acetaminophen (TYLENOL) 325 MG tablet Take 2 tablets (650 mg total) by mouth every 6 (six) hours as needed for mild pain (or Fever >/= 101). 10/30/21   [name], MD  ?albuterol (PROVENTIL) (2.5 MG/3ML) 0.083% nebulizer solution Take 3 mLs (2.5 mg total) by nebulization every 4 (four) hours as needed for wheezing or shortness of breath. 12/12/21 12/12/22  [name], MD  ?albuterol (VENTOLIN HFA) 108 (90 Base) MCG/ACT inhaler Inhale 2 puffs into the lungs every 6 (six) hours as needed for wheezing or shortness of breath. 01/09/22   [name], Pratik D, DO   ?atorvastatin (LIPITOR) 40 MG tablet TAKE ONE TABLET BY MOUTH DAILY ?Patient taking differently: Take 40 mg by mouth daily. 07/06/20   [name], MD  ?benztropine (COGENTIN) 0.5 MG tablet Take 0.5 mg by mouth at bedtime.    [provider]  ?Budeson-Glycopyrrol-Formoterol (BREZTRI AEROSPHERE) 160-9-4.8 MCG/ACT AERO Inhale 2 puffs into the lungs in the morning and at bedtime. 12/12/21   [name], MD  ?cetirizine (ZYRTEC) 10 MG tablet Take 10 mg by mouth daily.    [provider]  ?dextromethorphan-guaiFENesin (MUCINEX DM) 30-600 MG 12hr tablet Take 1 tablet by mouth 2 (two) times daily as needed for cough. ?Patient not taking: Reported on 02/19/2022 01/13/22   [name], MD  ?diclofenac Sodium (VOLTAREN) 1 % GEL Apply 2 g topically 4 (four) times daily.    [provider]  ?dicyclomine (BENTYL) 20 MG tablet Take 1 tablet (20 mg total) by mouth 2 (two) times daily as needed for spasms. 12/15/21   [name], PA-C  ?dorzolamide (TRUSOPT) 2 % ophthalmic solution Place 1 drop into the left eye 2 (two) times daily. 12/12/20   [provider]  ?hydrochlorothiazide (HYDRODIURIL) 25 MG tablet Take 0.5 tablets (12.5 mg total) by mouth daily. 10/30/21   [name], MD  ?hydrOXYzine (ATARAX) 25 MG tablet Take 1 tablet (25 mg total) by mouth 3 (three) times daily as needed for anxiety or nausea. 10/30/21   [name], MD  ?latanoprost (XALATAN) 0.005 % ophthalmic solution Place 1 drop into both eyes  at bedtime. 05/24/19   [provider]  ?losartan (COZAAR) 100 MG tablet Take 100 mg by mouth daily.    [provider]  ?loxapine (LOXITANE) 10 MG capsule Take 10 mg by mouth at bedtime.     [provider]  ?mirtazapine (REMERON) 30 MG tablet Take 1 tablet (30 mg total) by mouth at bedtime. 10/30/21   [name], MD  ?Multiple Vitamin (MULTIVITAMIN WITH MINERALS) TABS tablet Take 1 tablet by mouth daily. 10/31/21   [name], MD   ?OXYGEN Inhale 3-4 L into the lungs as directed. 3-4 liters with sleep and exertion as needed for low oxygen    [provider]  ?pantoprazole (PROTONIX) 40 MG tablet Take 1 tablet (40 mg total) by mouth daily. 10/30/21   [name], MD  ?polyethylene glycol (MIRALAX / GLYCOLAX) 17 g packet TAKE 17 GRAMS BY MOUTH DAILY ?Patient taking differently: Take 17 g by mouth daily as needed for mild constipation. 03/09/20   [name], MD  ?risperiDONE (RISPERDAL) 2 MG tablet Take 1 tablet (2 mg total) by mouth at bedtime. 10/30/21   [name], MD  ?senna-docusate (SENOKOT-S) 8.6-50 MG tablet Take 2 tablets by mouth at bedtime. ?Patient not taking: Reported on 02/03/2022 10/30/21   [name], MD  ?simethicone (MYLICON) 40 BH/4.1PF drops Take 0.6 mLs (40 mg total) by mouth 4 (four) times daily as needed for flatulence. ?Patient not taking: Reported on 02/03/2022 10/17/21   Orpah Greek, MD  ?simethicone (MYLICON) 80 MG chewable tablet Chew 1 tablet (80 mg total) by mouth 4 (four) times daily as needed for flatulence. ?Patient not taking: Reported on 02/03/2022 01/09/22   [name], DO  ?   ? ?Allergies    ?Penicillins and Levocetirizine   ? ?Review of Systems   ?Review of Systems  ?Constitutional:  Negative for chills and fever.  ?HENT:  Negative for congestion.   ?Eyes: Negative.   ?Respiratory:  Positive for cough, shortness of breath and wheezing. Negative for chest tightness.   ?Cardiovascular:  Positive for chest pain and leg swelling. Negative for palpitations.  ?Gastrointestinal:  Positive for nausea. Negative for abdominal pain and vomiting.  ?Genitourinary: Negative.   ?Musculoskeletal:  Negative for arthralgias, joint swelling and neck pain.  ?Skin: Negative.  Negative for rash and wound.  ?Neurological:  Negative for dizziness, weakness, light-headedness, numbness and headaches.  ?Psychiatric/Behavioral: Negative.    ?All other systems reviewed and are negative. ? ?Physical  Exam ?Updated Vital Signs ?BP (!) 143/80   Pulse 62   Temp 98.5 ?F (36.9 ?C) (Oral)   Resp 14   Ht 5\' 11"  (1.803 m)   Wt 65.8 kg   SpO2 99%   BMI 20.22 kg/m?  ?Physical Exam ?Vitals and nursing note reviewed.  ?Constitutional:   ?   General: He is in acute distress.  ?   Appearance: He is well-developed.  ?   Comments: Tachypneic, accessory muscle use.  Oxygen in place.  He is not hypoxic.  ?HENT:  ?   Head: Normocephalic and atraumatic.  ?Eyes:  ?   Conjunctiva/sclera: Conjunctivae normal.  ?Cardiovascular:  ?   Rate and Rhythm: Normal rate and regular rhythm.  ?   Heart sounds: Normal heart sounds.  ?Pulmonary:  ?   Effort: Tachypnea present.  ?   Breath sounds: Decreased breath sounds present. No wheezing or rhonchi.  ?   Comments: Very tight respirations, no wheezing currently, no rales. ?Chest:  ?   Chest  wall: No tenderness.  ?Abdominal:  ?   General: Bowel sounds are normal.  ?   Palpations: Abdomen is soft.  ?   Tenderness: There is no abdominal tenderness.  ?Musculoskeletal:     ?   General: Normal range of motion.  ?   Cervical back: Normal range of motion.  ?   Right lower leg: Edema present.  ?   Left lower leg: Edema present.  ?   Comments: Bilateral foot and ankle edema present, 1+,  no tibial edema.   ?Skin: ?   General: Skin is warm and dry.  ?Neurological:  ?   Mental Status: He is alert.  ? ? ?ED Results / Procedures / Treatments   ?Labs ?(all labs ordered are listed, but only abnormal results are displayed) ?Labs Reviewed  ?BASIC METABOLIC PANEL - Abnormal; Notable for the following components:  ?    Result Value  ? Potassium 3.4 (*)   ? CO2 33 (*)   ? BUN 27 (*)   ? All other components within normal limits  ?CBC - Abnormal; Notable for the following components:  ? WBC 17.8 (*)   ? MCV 102.9 (*)   ? All other components within normal limits  ?TROPONIN I (HIGH SENSITIVITY) - Abnormal; Notable for the following components:  ? Troponin I (High Sensitivity) 22 (*)   ? All other components  within normal limits  ?TROPONIN I (HIGH SENSITIVITY)  ? ? ?EKG ?EKG Interpretation ? ?Date/Time:  Wednesday Feb 27 2022 08:07:07 EDT ?Ventricular Rate:  106 ?PR Interval:  148 ?QRS Duration: 98 ?QT Interval:  370 ?QTC Calc

## 2022-02-27 NOTE — Hospital Course (Signed)
61 year old male with a history of COPD, tobacco abuse, hypertension, chronic respiratory failure on 3 L, anxiety, GERD presenting with shortness of breath and rib pain bilateral.  The patient was recently admitted to the hospital from 02/19/2022 to 02/21/2022 for COPD exacerbation.  He was discharged home with levofloxacin and prednisone.  The patient states that he was breathing better at the time of discharge, but did not feel like he was ready to go home.  Since going home, he continued to smoke at least 4 cigarettes/day.  He denies any illicit drug use.  He denies any fevers, chills, nausea, vomiting, diarrhea, hematochezia, melena.  There is no hemoptysis.  He complains of bilateral lower rib pain with coughing.  He denies any worsening lower extremity edema.  He sleeps on a hospital bed normally. ?In the ED, the patient was afebrile and hemodynamically stable with oxygen saturation 96% on 3 L.  BMP showed sodium 142, potassium 3.4, bicarbonate 33, BUN 27, creatinine 1.18.  WBC 17.8, hemoglobin 15.2, platelets 263,000.  Chest x-ray showed chronic interstitial markings.  The patient was started on DuoNebs and given Solu-Medrol IV 125 mg.  The patient was admitted for further evaluation and treatment. ?

## 2022-02-28 ENCOUNTER — Inpatient Hospital Stay (HOSPITAL_COMMUNITY): Payer: Medicaid Other

## 2022-02-28 DIAGNOSIS — J9611 Chronic respiratory failure with hypoxia: Secondary | ICD-10-CM | POA: Diagnosis not present

## 2022-02-28 DIAGNOSIS — R0789 Other chest pain: Secondary | ICD-10-CM | POA: Diagnosis not present

## 2022-02-28 DIAGNOSIS — I10 Essential (primary) hypertension: Secondary | ICD-10-CM | POA: Diagnosis not present

## 2022-02-28 DIAGNOSIS — J441 Chronic obstructive pulmonary disease with (acute) exacerbation: Secondary | ICD-10-CM | POA: Diagnosis not present

## 2022-02-28 DIAGNOSIS — R079 Chest pain, unspecified: Secondary | ICD-10-CM | POA: Diagnosis not present

## 2022-02-28 LAB — ECHOCARDIOGRAM COMPLETE
AR max vel: 2.04 cm2
AV Area VTI: 1.85 cm2
AV Area mean vel: 1.95 cm2
AV Mean grad: 5 mmHg
AV Peak grad: 10.2 mmHg
Ao pk vel: 1.6 m/s
Area-P 1/2: 2.83 cm2
Height: 71 in
S' Lateral: 3 cm
Weight: 2250.46 oz

## 2022-02-28 LAB — BASIC METABOLIC PANEL
Anion gap: 9 (ref 5–15)
BUN: 20 mg/dL (ref 6–20)
CO2: 33 mmol/L — ABNORMAL HIGH (ref 22–32)
Calcium: 8.7 mg/dL — ABNORMAL LOW (ref 8.9–10.3)
Chloride: 101 mmol/L (ref 98–111)
Creatinine, Ser: 0.8 mg/dL (ref 0.61–1.24)
GFR, Estimated: >60 mL/min (ref >60)
Glucose, Bld: 86 mg/dL (ref 70–99)
Potassium: 3.2 mmol/L — ABNORMAL LOW (ref 3.5–5.1)
Sodium: 143 mmol/L (ref 135–145)

## 2022-02-28 LAB — MAGNESIUM: Magnesium: 2.5 mg/dL — ABNORMAL HIGH (ref 1.7–2.4)

## 2022-02-28 MED ORDER — GUAIFENESIN ER 600 MG PO TB12
1200.0000 mg | ORAL_TABLET | Freq: Two times a day (BID) | ORAL | Status: DC
  Administered 2022-02-28 – 2022-03-02 (×5): 1200 mg via ORAL
  Filled 2022-02-28 (×5): qty 2

## 2022-02-28 MED ORDER — ARFORMOTEROL TARTRATE 15 MCG/2ML IN NEBU
15.0000 ug | INHALATION_SOLUTION | Freq: Two times a day (BID) | RESPIRATORY_TRACT | Status: DC
  Administered 2022-02-28 – 2022-03-02 (×3): 15 ug via RESPIRATORY_TRACT
  Filled 2022-02-28 (×4): qty 2

## 2022-02-28 MED ORDER — POTASSIUM CHLORIDE CRYS ER 20 MEQ PO TBCR
20.0000 meq | EXTENDED_RELEASE_TABLET | Freq: Once | ORAL | Status: AC
  Administered 2022-02-28: 20 meq via ORAL
  Filled 2022-02-28: qty 1

## 2022-02-28 NOTE — TOC Initial Note (Signed)
Transition of Care (TOC) - Initial/Assessment Note  ? ? ?Patient Details  ?Name: Tony Sullivan ?MRN: [id number] ?Date of Birth: [birth year] ? ?Transition of Care (TOC) CM/SW Contact:    ?Iona Beard, LCSWA ?Phone Number: ?02/28/2022, 10:41 AM ? ?Clinical Narrative:                 ?Pt is high risk for readmission. Pt is well known to [facility] department. Per note on 4/26 "LCSW met with pt at bedside. He reports he has an aid for 3 hours a day, 7 days a week. Pt unsure what he would want RN for. LCSW described services they provide and pt states he does not need any of these. No other needs reported at this time. TOC will continue to follow."  ? ?TOC to follow for needs that may arise at this admission.  ? ?Expected Discharge Plan: Home/Self Care ?Barriers to Discharge: No Barriers Identified ? ? ?Patient Goals and CMS Choice ?Patient states their goals for this hospitalization and ongoing recovery are:: return home ?CMS Medicare.gov Compare Post Acute Care list provided to:: Patient ?Choice offered to / list presented to : Patient ? ?Expected Discharge Plan and Services ?Expected Discharge Plan: Home/Self Care ?  ?  ?  ?Living arrangements for the past 2 months: [place] ?                ?  ?  ?  ?  ?  ?  ?  ?  ?  ?  ? ?Prior Living Arrangements/Services ?Living arrangements for the past 2 months: [place] ?Lives with:: Self ?Patient language and need for interpreter reviewed:: Yes ?Do you feel safe going back to the place where you live?: Yes      ?Need for Family Participation in Patient Care: Yes (Comment) ?Care giver support system in place?: Yes (comment) ?  ?Criminal Activity/Legal Involvement Pertinent to Current Situation/Hospitalization: No - Comment as needed ? ?Activities of Daily Living ?Home Assistive Devices/Equipment: Bedside commode/3-in-1, Eyeglasses, Hospital bed, Shower chair with back, Wheelchair ?ADL Screening (condition at time of admission) ?Patient's cognitive ability adequate  to safely complete daily activities?: Yes ?Is the patient deaf or have difficulty hearing?: No ?Does the patient have difficulty seeing, even when wearing glasses/contacts?: Yes ?Does the patient have difficulty concentrating, remembering, or making decisions?: No ?Patient able to express need for assistance with ADLs?: Yes ?Does the patient have difficulty dressing or bathing?: Yes ?Independently performs ADLs?: No ?Is this a change from baseline?: Pre-admission baseline ?Does the patient have difficulty walking or climbing stairs?: Yes ?Weakness of Legs: Both ?Weakness of Arms/Hands: Both ? ?Permission Sought/Granted ?  ?  ?   ?   ?   ?   ? ?Emotional Assessment ?  ?  ?  ?  ?Alcohol / Substance Use: Not Applicable ?Psych Involvement: No (comment) ? ?Admission diagnosis:  COPD exacerbation ([place]) [J44.1] ?Chest pain, unspecified type [R07.9] ?Patient Active Problem List  ? Diagnosis Date Noted  ? COPD exacerbation ([place]) 02/27/2022  ? Pedal edema 02/19/2022  ? CAP (community acquired pneumonia) 01/07/2022  ? Prolonged QT interval 01/07/2022  ? Glaucoma 01/07/2022  ? Elevated troponin 10/27/2021  ? Hyperglycemia 10/27/2021  ? Elevated MCV 10/27/2021  ? Respiratory failure with hypoxia and hypercapnia ([place]) 10/27/2021  ? Acute exacerbation of chronic obstructive pulmonary disease (COPD) ([place]) 10/02/2021  ? Hypophosphatemia 10/02/2021  ? Hypoalbuminemia due to protein-calorie malnutrition ([place]) 10/02/2021  ? Physical deconditioning 07/26/2021  ? Numbness and  tingling in left arm 05/06/2021  ? Leukocytosis 05/05/2021  ? Severe headache 05/05/2021  ? Mixed hyperlipidemia 05/24/2020  ? Hypokalemia 05/24/2020  ? Chest pain 05/23/2020  ? Bipolar disorder ([place]) 05/23/2020  ? HNP (herniated nucleus pulposus), cervical 09/06/2019  ? Abdominal pain   ? Poor dentition 11/04/2017  ? Acute on chronic respiratory failure with hypoxia and hypercapnia ([place]) 08/29/2017  ? ETOH abuse 03/30/2017  ? Cigarette smoker 03/30/2017  ? Acute  respiratory failure with hypoxia and hypercapnia ([place]) 03/29/2017  ? Special screening for malignant neoplasms, colon   ? Benign neoplasm of ascending colon   ? Benign neoplasm of descending colon   ? Hoarseness 08/22/2016  ? GERD (gastroesophageal reflux disease) 08/22/2016  ? Atypical chest pain 12/05/2015  ? Essential hypertension 06/15/2014  ? Pectoralis muscle strain 01/11/2014  ? COPD with acute exacerbation ([place]) 01/10/2014  ? COPD (chronic obstructive pulmonary disease) ([place]) 01/08/2014  ? Tobacco abuse 01/08/2014  ? Cough 01/08/2014  ? Chronic respiratory failure with hypoxia ([place]) 01/08/2014  ? ?PCP:  [name], FNP ?Pharmacy:   ?[id number] - [place], [address] ?[place] ?[place] [address] ?Phone: [phone] Fax: [phone] ? ? ? ? ?Social Determinants of Health (SDOH) Interventions ?  ? ?Readmission Risk Interventions ? ?  02/28/2022  ? 10:40 AM 09/08/2019  ?  3:22 PM  ?Readmission Risk Prevention Plan  ?Transportation Screening Complete Complete  ?PCP or Specialist Appt within 3-5 Days  Complete  ?[place] or Home Care Consult  Complete  ?Social Work Consult for [place] Planning/Counseling  Complete  ?Palliative Care Screening  Not Applicable  ?Medication Review Press photographer) Complete Complete  ?[place] or Home Care Consult Complete   ?SW Recovery Care/Counseling Consult Complete   ?Palliative Care Screening Not Applicable   ?[place] Not Applicable   ? ? ? ?

## 2022-02-28 NOTE — Progress Notes (Signed)
?  ?       ?PROGRESS NOTE ? ?Tony Sullivan HCW:[id number] DOB: April 11, 1961 DOA: 02/27/2022 ?PCP: [name], FNP ? ?Brief History:  ?61 year old male with a history of COPD, tobacco abuse, hypertension, chronic respiratory failure on 3 L, anxiety, GERD presenting with shortness of breath and rib pain bilateral.  The patient was recently admitted to the hospital from 02/19/2022 to 02/21/2022 for COPD exacerbation.  He was discharged home with levofloxacin and prednisone.  The patient states that he was breathing better at the time of discharge, but did not feel like he was ready to go home.  Since going home, he continued to smoke at least 4 cigarettes/day.  He denies any illicit drug use.  He denies any fevers, chills, nausea, vomiting, diarrhea, hematochezia, melena.  There is no hemoptysis.  He complains of bilateral lower rib pain with coughing.  He denies any worsening lower extremity edema.  He sleeps on a hospital bed normally. ?In the ED, the patient was afebrile and hemodynamically stable with oxygen saturation 96% on 3 L.  BMP showed sodium 142, potassium 3.4, bicarbonate 33, BUN 27, creatinine 1.18.  WBC 17.8, hemoglobin 15.2, platelets 263,000.  Chest x-ray showed chronic interstitial markings.  The patient was started on DuoNebs and given Solu-Medrol IV 125 mg.  The patient was admitted for further evaluation and treatment.  ? ? ?Assessment and Plan: ?COPD exacerbation ([place]) ?Continue DuoNebs ?continue Pulmicort ?Start Brovana  ?Continue IV Solu-Medrol ?Add mucinex ? ? ?Hypokalemia ?Replete ?Check magnesium--2.5 ? ?Mixed hyperlipidemia ?Continue statin ? ?Atypical chest pain ?Troponins unremarkable ?Echocardiogram ?EKG with nonspecific T wave changes ? ?Essential hypertension ?Continue losartan ? ?Chronic respiratory failure with hypoxia (HCC) ?Chronically on 3 L ? ?Tobacco abuse ?Tobacco cessation discussed ? ? ? ? ? ?Family Communication:   no Family at bedside ? ?Consultants:  none ? ?Code Status:   FULL  ? ?DVT Prophylaxis:   Lovenox ? ? ?Procedures: ?As Listed in Progress Note Above ? ? ? ? ? ? ?Subjective: ?Patient states that he is breathing slightly better but has shortness of breath with minimal exertion.  He has a nonproductive cough, and he denies any hemoptysis.  He denies any nausea, vomiting or diarrhea, abdominal pain.  He has pleuritic chest pain with coughing ? ?Objective: ?Vitals:  ? 02/28/22 0159 02/28/22 1761 02/28/22 6073 02/28/22 0845  ?BP: 121/65     ?Pulse: 82     ?Resp: 19     ?Temp: 98.2 ?F (36.8 ?C)     ?TempSrc:      ?SpO2: 100% 97% 100% 100%  ?Weight:      ?Height:      ? ? ?Intake/Output Summary (Last 24 hours) at 02/28/2022 1146 ?Last data filed at 02/28/2022 1132 ?Gross per 24 hour  ?Intake 1040 ml  ?Output 850 ml  ?Net 190 ml  ? ?Weight change:  ?Exam: ? ?General:  Pt is alert, follows commands appropriately, not in acute distress ?HEENT: No icterus, No thrush, No neck mass, [place]/AT ?Cardiovascular: RRR, S1/S2, no rubs, no gallops ?Respiratory: Diminished breath sounds bilateral.  Bibasilar rales.  Bibasilar wheeze. ?Abdomen: Soft/+BS, non tender, non distended, no guarding ?Extremities: No edema, No lymphangitis, No petechiae, No rashes, no synovitis ? ? ?Data Reviewed: ?I have personally reviewed following labs and imaging studies ?Basic Metabolic Panel: ?Recent Labs  ?Lab 02/27/22 ?0830 02/28/22 ?0452  ?NA 142 143  ?K 3.4* 3.2*  ?CL 100 101  ?CO2 33* 33*  ?GLUCOSE 91 86  ?BUN  27* 20  ?CREATININE 1.18 0.80  ?CALCIUM 9.1 8.7*  ?MG  --  2.5*  ? ?Liver Function Tests: ?No results for input(s): AST, ALT, ALKPHOS, BILITOT, PROT, ALBUMIN in the last 168 hours. ?No results for input(s): LIPASE, AMYLASE in the last 168 hours. ?No results for input(s): AMMONIA in the last 168 hours. ?Coagulation Profile: ?No results for input(s): INR, PROTIME in the last 168 hours. ?CBC: ?Recent Labs  ?Lab 02/27/22 ?0830  ?WBC 17.8*  ?HGB 15.2  ?HCT 50.2  ?MCV 102.9*  ?PLT 263  ? ?Cardiac Enzymes: ?No results  for input(s): CKTOTAL, CKMB, CKMBINDEX, TROPONINI in the last 168 hours. ?BNP: ?Invalid input(s): POCBNP ?CBG: ?No results for input(s): GLUCAP in the last 168 hours. ?HbA1C: ?No results for input(s): HGBA1C in the last 72 hours. ?Urine analysis: ?   ?Component Value Date/Time  ? COLORURINE YELLOW 10/16/2021 0130  ? APPEARANCEUR CLEAR 10/16/2021 0130  ? LABSPEC 1.020 10/16/2021 0130  ? PHURINE 6.0 10/16/2021 0130  ? GLUCOSEU NEGATIVE 10/16/2021 0130  ? HGBUR TRACE (A) 10/16/2021 0130  ? [place] NEGATIVE 10/16/2021 0130  ? [place] NEGATIVE 10/16/2021 0130  ? PROTEINUR NEGATIVE 10/16/2021 0130  ? UROBILINOGEN 1.0 06/12/2015 1021  ? NITRITE NEGATIVE 10/16/2021 0130  ? LEUKOCYTESUR NEGATIVE 10/16/2021 0130  ? ?Sepsis Labs: ?@LABRCNTIP (procalcitonin:4,lacticidven:4) ?)No results found for this or any previous visit (from the past 240 hour(s)).  ? ?Scheduled Meds: ? arformoterol  15 mcg Nebulization BID  ? atorvastatin  40 mg Oral Daily  ? benztropine  0.5 mg Oral QHS  ? budesonide (PULMICORT) nebulizer solution  0.5 mg Nebulization BID  ? dorzolamide  1 drop Left Eye BID  ? enoxaparin (LOVENOX) injection  40 mg Subcutaneous Q24H  ? guaiFENesin  1,200 mg Oral BID  ? ipratropium-albuterol  3 mL Nebulization Q6H  ? latanoprost  1 drop Both Eyes QHS  ? losartan  100 mg Oral Daily  ? loxapine  10 mg Oral QHS  ? methylPREDNISolone (SOLU-MEDROL) injection  40 mg Intravenous Q12H  ? multivitamin with minerals  1 tablet Oral Daily  ? pantoprazole  40 mg Oral Daily  ? potassium chloride  20 mEq Oral Once  ? risperiDONE  2 mg Oral QHS  ? ?Continuous Infusions: ? ?Procedures/Studies: ?DG Chest 2 View ? ?Result Date: 02/19/2022 ?CLINICAL DATA:  Chest Pain EXAM: CHEST - 2 VIEW COMPARISON:  February 14, 2022. FINDINGS: No consolidation. No visible pleural effusions or pneumothorax. Emphysema. Remote right rib fracture. Cardiomediastinal silhouette is within normal limits. IMPRESSION: 1. No evidence of acute cardiopulmonary disease.  2.  Emphysema (ICD10-J43.9). Electronically Signed   By: Margaretha Sheffield M.D.   On: 02/19/2022 10:02  ? ?DG Chest 2 View ? ?Result Date: 01/29/2022 ?CLINICAL DATA:  Cough and shortness of breath. EXAM: CHEST - 2 VIEW COMPARISON:  Chest x-ray 01/25/2022. FINDINGS: Lungs are hyperinflated, unchanged. There is no focal lung infiltrate, pleural effusion or pneumothorax identified. There is a healed right 6 rib fracture which is unchanged. No acute fractures are identified. IMPRESSION: 1. No acute cardiopulmonary process. 2. Stable changes of emphysema. Electronically Signed   By: Ronney Asters M.D.   On: 01/29/2022 20:18  ? ?CT Angio Chest Pulmonary Embolism (PE) W or WO Contrast ? ?Result Date: 02/14/2022 ?CLINICAL DATA:  Pulmonary embolism suspected hemoptysis since eighteenth EXAM: CT ANGIOGRAPHY CHEST WITH CONTRAST TECHNIQUE: Multidetector CT imaging of the chest was performed using the standard protocol during bolus administration of intravenous contrast. Multiplanar CT image reconstructions and MIPs were obtained to evaluate  the vascular anatomy. RADIATION DOSE REDUCTION: This exam was performed according to the departmental dose-optimization program which includes automated exposure control, adjustment of the mA and/or kV according to patient size and/or use of iterative reconstruction technique. CONTRAST:  115mL OMNIPAQUE IOHEXOL 350 MG/ML SOLN COMPARISON:  10/17/2021 FINDINGS: Cardiovascular: Satisfactory opacification of the pulmonary arteries to the segmental level. No evidence of pulmonary embolism when accounting for levels of motion artifact. Normal heart size. No pericardial effusion. Mild coronary atheromatous calcification. Mediastinum/Nodes: Negative for adenopathy. Lungs/Pleura: Advanced emphysema. There is airway obstruction and opacification involving the apical segment of the left upper lobe. No discrete enhancing mass is seen. Generalized airway thickening with bronchomalacia. No edema, effusion,  or pneumothorax. No alveolar hemorrhage seen. Upper Abdomen: Negative Musculoskeletal: Small area of AVN at the right humeral head. T10, T11, L1 remote compression fractures. Review of the MIP images c

## 2022-02-28 NOTE — Progress Notes (Signed)
Patient refusing all bedtime meds except for mucinex and eye drops. Patient stated "I'm not taking any mental meds here, they make me feel funny". Nurse educated patient on the importance of staying on schedule with his medicine. Patient stated "I take them at home, and I will take them when I go home".  ?

## 2022-03-01 DIAGNOSIS — J441 Chronic obstructive pulmonary disease with (acute) exacerbation: Secondary | ICD-10-CM | POA: Diagnosis not present

## 2022-03-01 DIAGNOSIS — J9611 Chronic respiratory failure with hypoxia: Secondary | ICD-10-CM | POA: Diagnosis not present

## 2022-03-01 DIAGNOSIS — R0789 Other chest pain: Secondary | ICD-10-CM | POA: Diagnosis not present

## 2022-03-01 LAB — BASIC METABOLIC PANEL
Anion gap: 5 (ref 5–15)
BUN: 19 mg/dL (ref 6–20)
CO2: 32 mmol/L (ref 22–32)
Calcium: 8.5 mg/dL — ABNORMAL LOW (ref 8.9–10.3)
Chloride: 105 mmol/L (ref 98–111)
Creatinine, Ser: 0.73 mg/dL (ref 0.61–1.24)
GFR, Estimated: >60 mL/min (ref >60)
Glucose, Bld: 107 mg/dL — ABNORMAL HIGH (ref 70–99)
Potassium: 3.5 mmol/L (ref 3.5–5.1)
Sodium: 142 mmol/L (ref 135–145)

## 2022-03-01 MED ORDER — ALBUTEROL SULFATE (2.5 MG/3ML) 0.083% IN NEBU: 2.5000 mg | INHALATION_SOLUTION | Freq: Four times a day (QID) | RESPIRATORY_TRACT | Status: DC

## 2022-03-01 MED ORDER — MELATONIN 3 MG PO TABS
6.0000 mg | ORAL_TABLET | Freq: Once | ORAL | Status: AC
  Administered 2022-03-01: 6 mg via ORAL
  Filled 2022-03-01: qty 2

## 2022-03-01 MED ORDER — SIMETHICONE 80 MG PO CHEW
80.0000 mg | CHEWABLE_TABLET | Freq: Once | ORAL | Status: AC
  Administered 2022-03-01: 80 mg via ORAL
  Filled 2022-03-01: qty 1

## 2022-03-01 MED ORDER — REVEFENACIN 175 MCG/3ML IN SOLN
175.0000 ug | Freq: Every day | RESPIRATORY_TRACT | Status: DC
  Filled 2022-03-01: qty 3

## 2022-03-01 MED ORDER — ALUM & MAG HYDROXIDE-SIMETH 200-200-20 MG/5ML PO SUSP
15.0000 mL | Freq: Four times a day (QID) | ORAL | Status: DC | PRN
  Filled 2022-03-01: qty 30

## 2022-03-01 MED ORDER — SIMETHICONE 80 MG PO CHEW
80.0000 mg | CHEWABLE_TABLET | Freq: Four times a day (QID) | ORAL | Status: DC | PRN
  Administered 2022-03-01: 80 mg via ORAL
  Filled 2022-03-01: qty 1

## 2022-03-01 NOTE — Progress Notes (Signed)
?  ?       ?PROGRESS NOTE ? ?Tony Sullivan FIE:[id number] DOB: 03-22-1961 DOA: 02/27/2022 ?PCP: [name], FNP ? ?Brief History:  ?61 year old male with a history of COPD, tobacco abuse, hypertension, chronic respiratory failure on 3 L, anxiety, GERD presenting with shortness of breath and rib pain bilateral.  The patient was recently admitted to the hospital from 02/19/2022 to 02/21/2022 for COPD exacerbation.  He was discharged home with levofloxacin and prednisone.  The patient states that he was breathing better at the time of discharge, but did not feel like he was ready to go home.  Since going home, he continued to smoke at least 4 cigarettes/day.  He denies any illicit drug use.  He denies any fevers, chills, nausea, vomiting, diarrhea, hematochezia, melena.  There is no hemoptysis.  He complains of bilateral lower rib pain with coughing.  He denies any worsening lower extremity edema.  He sleeps on a hospital bed normally. ?In the ED, the patient was afebrile and hemodynamically stable with oxygen saturation 96% on 3 L.  BMP showed sodium 142, potassium 3.4, bicarbonate 33, BUN 27, creatinine 1.18.  WBC 17.8, hemoglobin 15.2, platelets 263,000.  Chest x-ray showed chronic interstitial markings.  The patient was started on DuoNebs and given Solu-Medrol IV 125 mg.  The patient was admitted for further evaluation and treatment.  ? ? ?Assessment and Plan: ?COPD exacerbation ([place]) ?Continue DuoNebs ?continue Pulmicort ?continue Brovana  ?Continue IV Solu-Medrol ?Add mucinex ?Start yupelri ?Pt states he still feels "tight" ?RN respiratory reporting pt has been occasionally refusing meds ? ? ?Hypokalemia ?Replete ?Check magnesium--2.5 ? ?Mixed hyperlipidemia ?Continue statin ? ?Atypical chest pain ?Troponins unremarkable ?Echo--EF 60-65%, no WMA, G1DD ?EKG with nonspecific T wave changes ? ?Essential hypertension ?Continue losartan ? ?Chronic respiratory failure with hypoxia (HCC) ?Chronically on 3  L ? ?Tobacco abuse ?Tobacco cessation discussed ? ?  ?  ?Family Communication:   no Family at bedside ?  ?Consultants:  none ?  ?Code Status:  FULL  ?  ?DVT Prophylaxis:  [place] Lovenox ?  ?  ?Procedures: ?As Listed in Progress Note Above ?  ? ? ? ? ? ?Subjective: ?Patient states he still feels "tight".  Denies cp, n/v/d, abd pain.  No hemoptysis. ? ?Objective: ?Vitals:  ? 03/01/22 0511 03/01/22 1017 03/01/22 1317 03/01/22 1545  ?BP: (!) 144/88  140/86   ?Pulse: 70 79 70 76  ?Resp: 17 20 18 20   ?Temp: 98.1 ?F (36.7 ?C)  98 ?F (36.7 ?C)   ?TempSrc:   Oral   ?SpO2: 98% 97% 100% 99%  ?Weight:      ?Height:      ? ? ?Intake/Output Summary (Last 24 hours) at 03/01/2022 1830 ?Last data filed at 03/01/2022 0700 ?Gross per 24 hour  ?Intake 480 ml  ?Output 775 ml  ?Net -295 ml  ? ?Weight change:  ?Exam: ? ?General:  Pt is alert, follows commands appropriately, not in acute distress ?HEENT: No icterus, No thrush, No neck mass, [place]/AT ?Cardiovascular: RRR, S1/S2, no rubs, no gallops ?Respiratory: diminished BS at bases, no wheeze ?Abdomen: Soft/+BS, non tender, non distended, no guarding ?Extremities: No edema, No lymphangitis, No petechiae, No rashes, no synovitis ? ? ?Data Reviewed: ?I have personally reviewed following labs and imaging studies ?Basic Metabolic Panel: ?Recent Labs  ?Lab 02/27/22 ?0830 02/28/22 ?0452 03/01/22 ?0518  ?NA 142 143 142  ?K 3.4* 3.2* 3.5  ?CL 100 101 105  ?CO2 33* 33* 32  ?GLUCOSE 91  86 107*  ?BUN 27* 20 19  ?CREATININE 1.18 0.80 0.73  ?CALCIUM 9.1 8.7* 8.5*  ?MG  --  2.5*  --   ? ?Liver Function Tests: ?No results for input(s): AST, ALT, ALKPHOS, BILITOT, PROT, ALBUMIN in the last 168 hours. ?No results for input(s): LIPASE, AMYLASE in the last 168 hours. ?No results for input(s): AMMONIA in the last 168 hours. ?Coagulation Profile: ?No results for input(s): INR, PROTIME in the last 168 hours. ?CBC: ?Recent Labs  ?Lab 02/27/22 ?0830  ?WBC 17.8*  ?HGB 15.2  ?HCT 50.2  ?MCV 102.9*  ?PLT 263  ? ?Cardiac  Enzymes: ?No results for input(s): CKTOTAL, CKMB, CKMBINDEX, TROPONINI in the last 168 hours. ?BNP: ?Invalid input(s): POCBNP ?CBG: ?No results for input(s): GLUCAP in the last 168 hours. ?HbA1C: ?No results for input(s): HGBA1C in the last 72 hours. ?Urine analysis: ?   ?Component Value Date/Time  ? COLORURINE YELLOW 10/16/2021 0130  ? APPEARANCEUR CLEAR 10/16/2021 0130  ? LABSPEC 1.020 10/16/2021 0130  ? PHURINE 6.0 10/16/2021 0130  ? GLUCOSEU NEGATIVE 10/16/2021 0130  ? HGBUR TRACE (A) 10/16/2021 0130  ? [place] NEGATIVE 10/16/2021 0130  ? [place] NEGATIVE 10/16/2021 0130  ? PROTEINUR NEGATIVE 10/16/2021 0130  ? UROBILINOGEN 1.0 06/12/2015 1021  ? NITRITE NEGATIVE 10/16/2021 0130  ? LEUKOCYTESUR NEGATIVE 10/16/2021 0130  ? ?Sepsis Labs: ?@LABRCNTIP (procalcitonin:4,lacticidven:4) ?)No results found for this or any previous visit (from the past 240 hour(s)).  ? ?Scheduled Meds: ? arformoterol  15 mcg Nebulization BID  ? atorvastatin  40 mg Oral Daily  ? benztropine  0.5 mg Oral QHS  ? budesonide (PULMICORT) nebulizer solution  0.5 mg Nebulization BID  ? dorzolamide  1 drop Left Eye BID  ? enoxaparin (LOVENOX) injection  40 mg Subcutaneous Q24H  ? guaiFENesin  1,200 mg Oral BID  ? ipratropium-albuterol  3 mL Nebulization Q6H  ? latanoprost  1 drop Both Eyes QHS  ? losartan  100 mg Oral Daily  ? loxapine  10 mg Oral QHS  ? methylPREDNISolone (SOLU-MEDROL) injection  40 mg Intravenous Q12H  ? multivitamin with minerals  1 tablet Oral Daily  ? pantoprazole  40 mg Oral Daily  ? risperiDONE  2 mg Oral QHS  ? ?Continuous Infusions: ? ?Procedures/Studies: ?DG Chest 2 View ? ?Result Date: 02/19/2022 ?CLINICAL DATA:  Chest Pain EXAM: CHEST - 2 VIEW COMPARISON:  February 14, 2022. FINDINGS: No consolidation. No visible pleural effusions or pneumothorax. Emphysema. Remote right rib fracture. Cardiomediastinal silhouette is within normal limits. IMPRESSION: 1. No evidence of acute cardiopulmonary disease. 2.  Emphysema  (ICD10-J43.9). Electronically Signed   By: Margaretha Sheffield M.D.   On: 02/19/2022 10:02  ? ?CT Angio Chest Pulmonary Embolism (PE) W or WO Contrast ? ?Result Date: 02/14/2022 ?CLINICAL DATA:  Pulmonary embolism suspected hemoptysis since eighteenth EXAM: CT ANGIOGRAPHY CHEST WITH CONTRAST TECHNIQUE: Multidetector CT imaging of the chest was performed using the standard protocol during bolus administration of intravenous contrast. Multiplanar CT image reconstructions and MIPs were obtained to evaluate the vascular anatomy. RADIATION DOSE REDUCTION: This exam was performed according to the departmental dose-optimization program which includes automated exposure control, adjustment of the mA and/or kV according to patient size and/or use of iterative reconstruction technique. CONTRAST:  15mL OMNIPAQUE IOHEXOL 350 MG/ML SOLN COMPARISON:  10/17/2021 FINDINGS: Cardiovascular: Satisfactory opacification of the pulmonary arteries to the segmental level. No evidence of pulmonary embolism when accounting for levels of motion artifact. Normal heart size. No pericardial effusion. Mild coronary atheromatous calcification. Mediastinum/Nodes: Negative  for adenopathy. Lungs/Pleura: Advanced emphysema. There is airway obstruction and opacification involving the apical segment of the left upper lobe. No discrete enhancing mass is seen. Generalized airway thickening with bronchomalacia. No edema, effusion, or pneumothorax. No alveolar hemorrhage seen. Upper Abdomen: Negative Musculoskeletal: Small area of AVN at the right humeral head. T10, T11, L1 remote compression fractures. Review of the MIP images confirms the above findings. IMPRESSION: 1. Segmental airway opacification in the left upper lobe, possibly a localizing sign related to the hemoptysis. No discrete lesion, but recommend imaging or bronchoscopic follow-up to document clearance. 2. Negative for pulmonary embolism. Motion artifact at the lung bases. 3. Advanced  emphysema. Electronically Signed   By: Jorje Guild M.D.   On: 02/14/2022 07:53  ? ?DG Chest Port 1 View ? ?Result Date: 02/27/2022 ?CLINICAL DATA:  Worsening shortness of breath.  History of COPD. EXAM: PORTABLE CHEST 1 VIEW COM

## 2022-03-02 DIAGNOSIS — J441 Chronic obstructive pulmonary disease with (acute) exacerbation: Secondary | ICD-10-CM | POA: Diagnosis not present

## 2022-03-02 DIAGNOSIS — J9611 Chronic respiratory failure with hypoxia: Secondary | ICD-10-CM | POA: Diagnosis not present

## 2022-03-02 DIAGNOSIS — I10 Essential (primary) hypertension: Secondary | ICD-10-CM | POA: Diagnosis not present

## 2022-03-02 DIAGNOSIS — R0789 Other chest pain: Secondary | ICD-10-CM | POA: Diagnosis not present

## 2022-03-02 MED ORDER — GUAIFENESIN 200 MG PO TABS
400.0000 mg | ORAL_TABLET | Freq: Four times a day (QID) | ORAL | Status: DC | PRN
  Filled 2022-03-02: qty 2

## 2022-03-02 MED ORDER — PREDNISONE 20 MG PO TABS: 60.0000 mg | ORAL_TABLET | Freq: Every day | ORAL | Status: DC

## 2022-03-02 MED ORDER — GUAIFENESIN 200 MG PO TABS
400.0000 mg | ORAL_TABLET | Freq: Four times a day (QID) | ORAL | 0 refills | Status: DC | PRN
Start: 2022-03-02 — End: 2022-05-03

## 2022-03-02 MED ORDER — ALBUTEROL SULFATE (2.5 MG/3ML) 0.083% IN NEBU
2.5000 mg | INHALATION_SOLUTION | Freq: Three times a day (TID) | RESPIRATORY_TRACT | Status: DC
  Administered 2022-03-02: 2.5 mg via RESPIRATORY_TRACT
  Filled 2022-03-02: qty 3

## 2022-03-02 MED ORDER — PREDNISONE 10 MG PO TABS: 60.0000 mg | ORAL_TABLET | Freq: Every day | ORAL | 0 refills | Status: DC

## 2022-03-02 NOTE — Progress Notes (Signed)
Pt discharged home with brother in stable condition. Discharge instructions given. Scripts sent to pharmacy of choice. Pt verbalized understanding. DC'd from unit via motorized wheelchair.  ?

## 2022-03-02 NOTE — Discharge Summary (Signed)
?Physician Discharge Summary ?  ?Patient: Tony Sullivan MRN: [id number] DOB: 08-24-1961  ?Admit date:     02/27/2022  ?Discharge date: 03/02/22  ?Discharge Physician: Shanon Brow Sola Margolis  ? ?PCP: [name], FNP  ? ?Recommendations at discharge:  ? Please follow up with primary care provider within 1-2 weeks ? Please repeat BMP and CBC in one week ? ? ?Hospital Course: ?61 year old male with a history of COPD, tobacco abuse, hypertension, chronic respiratory failure on 3 L, anxiety, GERD presenting with shortness of breath and rib pain bilateral.  The patient was recently admitted to the hospital from 02/19/2022 to 02/21/2022 for COPD exacerbation.  He was discharged home with levofloxacin and prednisone.  The patient states that he was breathing better at the time of discharge, but did not feel like he was ready to go home.  Since going home, he continued to smoke at least 4 cigarettes/day.  He denies any illicit drug use.  He denies any fevers, chills, nausea, vomiting, diarrhea, hematochezia, melena.  There is no hemoptysis.  He complains of bilateral lower rib pain with coughing.  He denies any worsening lower extremity edema.  He sleeps on a hospital bed normally. ?In the ED, the patient was afebrile and hemodynamically stable with oxygen saturation 96% on 3 L.  BMP showed sodium 142, potassium 3.4, bicarbonate 33, BUN 27, creatinine 1.18.  WBC 17.8, hemoglobin 15.2, platelets 263,000.  Chest x-ray showed chronic interstitial markings.  The patient was started on DuoNebs and given Solu-Medrol IV 125 mg.  The patient was admitted for further evaluation and treatment. ? ?Assessment and Plan: ?COPD exacerbation ([place]) ?Continue DuoNebs ?continue Pulmicort ?continue Brovana  ?Continue IV Solu-Medrol>>d/c home with prednisone taper over 2 weeks ?Add mucinex ?Started yupelri ?RN respiratory reporting pt has been occasionally refusing meds ? ? ?Hypokalemia ?Replete ?Check magnesium--2.5 ? ?Mixed hyperlipidemia ?Continue  statin ? ?Atypical chest pain ?Troponins unremarkable ?Echo--EF 60-65%, no WMA, G1DD ?EKG with nonspecific T wave changes ? ?Essential hypertension ?Continue losartan ? ?Chronic respiratory failure with hypoxia (HCC) ?Chronically on 3 L ?Remained stable on 3L through the hospitalization ? ?Tobacco abuse ?Tobacco cessation discussed ? ? ? ? ?  ? ? ?Consultants: none ?Procedures performed: none  ?Disposition: Home ?Diet recommendation:  ?Cardiac diet ?DISCHARGE MEDICATION: ?Allergies as of 03/02/2022   ? ?   Reactions  ? Penicillins Anaphylaxis  ? Has patient had a PCN reaction causing immediate rash, facial/tongue/throat swelling, SOB or lightheadedness with hypotension: Yes ?Has patient had a PCN reaction causing severe rash involving mucus membranes or skin necrosis: No ?Has patient had a PCN reaction that required hospitalization No ?Has patient had a PCN reaction occurring within the last 10 years: No ?If all of the above answers are "NO", then may proceed with Cephalosporin use.  ? Levocetirizine Other (See Comments)  ? Muscle cramps  ? ?  ? ?  ?Medication List  ?  ? ?STOP taking these medications   ? ?dextromethorphan-guaiFENesin 30-600 MG 12hr tablet ?Commonly known as: [place] DM ?  ?dicyclomine 20 MG tablet ?Commonly known as: BENTYL ?  ?simethicone 40 MG/0.6ML drops ?Commonly known as: MYLICON ?  ?simethicone 80 MG chewable tablet ?Commonly known as: MYLICON ?  ? ?  ? ?TAKE these medications   ? ?acetaminophen 325 MG tablet ?Commonly known as: TYLENOL ?Take 2 tablets (650 mg total) by mouth every 6 (six) hours as needed for mild pain (or Fever >/= 101). ?  ?albuterol (2.5 MG/3ML) 0.083% nebulizer solution ?Commonly known as:  PROVENTIL ?Take 3 mLs (2.5 mg total) by nebulization every 4 (four) hours as needed for wheezing or shortness of breath. ?  ?albuterol 108 (90 Base) MCG/ACT inhaler ?Commonly known as: VENTOLIN HFA ?Inhale 2 puffs into the lungs every 6 (six) hours as needed for wheezing or shortness of  breath. ?  ?atorvastatin 40 MG tablet ?Commonly known as: LIPITOR ?TAKE ONE TABLET BY MOUTH DAILY ?  ?benztropine 0.5 MG tablet ?Commonly known as: COGENTIN ?Take 0.5 mg by mouth at bedtime. ?  ?Breztri Aerosphere 160-9-4.8 MCG/ACT Aero ?Generic drug: Budeson-Glycopyrrol-Formoterol ?Inhale 2 puffs into the lungs in the morning and at bedtime. ?  ?cetirizine 10 MG tablet ?Commonly known as: ZYRTEC ?Take 10 mg by mouth daily. ?  ?diclofenac Sodium 1 % Gel ?Commonly known as: VOLTAREN ?Apply 2 g topically 4 (four) times daily. ?  ?dorzolamide 2 % ophthalmic solution ?Commonly known as: TRUSOPT ?Place 1 drop into the left eye 2 (two) times daily. ?  ?guaiFENesin 200 MG tablet ?Take 2 tablets (400 mg total) by mouth every 6 (six) hours as needed for cough or to loosen phlegm. ?  ?hydrochlorothiazide 25 MG tablet ?Commonly known as: HYDRODIURIL ?Take 0.5 tablets (12.5 mg total) by mouth daily. ?  ?hydrOXYzine 25 MG tablet ?Commonly known as: ATARAX ?Take 1 tablet (25 mg total) by mouth 3 (three) times daily as needed for anxiety or nausea. ?  ?latanoprost 0.005 % ophthalmic solution ?Commonly known as: XALATAN ?Place 1 drop into both eyes at bedtime. ?  ?losartan 100 MG tablet ?Commonly known as: COZAAR ?Take 100 mg by mouth daily. ?  ?loxapine 10 MG capsule ?Commonly known as: LOXITANE ?Take 10 mg by mouth at bedtime. ?  ?mirtazapine 30 MG tablet ?Commonly known as: REMERON ?Take 1 tablet (30 mg total) by mouth at bedtime. ?  ?multivitamin with minerals Tabs tablet ?Take 1 tablet by mouth daily. ?  ?OXYGEN ?Inhale 3-4 L into the lungs as directed. 3-4 liters with sleep and exertion as needed for low oxygen ?  ?pantoprazole 40 MG tablet ?Commonly known as: PROTONIX ?Take 1 tablet (40 mg total) by mouth daily. ?  ?polyethylene glycol 17 g packet ?Commonly known as: MIRALAX / GLYCOLAX ?TAKE 17 GRAMS BY MOUTH DAILY ?What changed: See the new instructions. ?  ?predniSONE 10 MG tablet ?Commonly known as: DELTASONE ?Take 6  tablets (60 mg total) by mouth daily with breakfast. Decrease by 1 tablet every 2 days ?Start taking on: Mar 03, 2022 ?  ?risperiDONE 2 MG tablet ?Commonly known as: RISPERDAL ?Take 1 tablet (2 mg total) by mouth at bedtime. ?  ?senna-docusate 8.6-50 MG tablet ?Commonly known as: Senokot-S ?Take 2 tablets by mouth at bedtime. ?  ? ?  ? ? ?Discharge Exam: ?Filed Weights  ? 02/27/22 0801 02/27/22 1630  ?Weight: 65.8 kg 63.8 kg  ? ?HEENT:  [place]/AT, No thrush, no icterus ?CV:  RRR, no rub, no S3, no S4 ?Lung:  diminished BS.  No wheeze ?Abd:  soft/+BS, NT ?Ext:  No edema, no lymphangitis, no synovitis, no rash ? ? ?Condition at discharge: stable ? ?The results of significant diagnostics from this hospitalization (including imaging, microbiology, ancillary and laboratory) are listed below for reference.  ? ?Imaging Studies: ?DG Chest 2 View ? ?Result Date: 02/19/2022 ?CLINICAL DATA:  Chest Pain EXAM: CHEST - 2 VIEW COMPARISON:  February 14, 2022. FINDINGS: No consolidation. No visible pleural effusions or pneumothorax. Emphysema. Remote right rib fracture. Cardiomediastinal silhouette is within normal limits. IMPRESSION: 1. No evidence  of acute cardiopulmonary disease. 2.  Emphysema (ICD10-J43.9). Electronically Signed   By: Margaretha Sheffield M.D.   On: 02/19/2022 10:02  ? ?CT Angio Chest Pulmonary Embolism (PE) W or WO Contrast ? ?Result Date: 02/14/2022 ?CLINICAL DATA:  Pulmonary embolism suspected hemoptysis since eighteenth EXAM: CT ANGIOGRAPHY CHEST WITH CONTRAST TECHNIQUE: Multidetector CT imaging of the chest was performed using the standard protocol during bolus administration of intravenous contrast. Multiplanar CT image reconstructions and MIPs were obtained to evaluate the vascular anatomy. RADIATION DOSE REDUCTION: This exam was performed according to the departmental dose-optimization program which includes automated exposure control, adjustment of the mA and/or kV according to patient size and/or use of iterative  reconstruction technique. CONTRAST:  163mL OMNIPAQUE IOHEXOL 350 MG/ML SOLN COMPARISON:  10/17/2021 FINDINGS: Cardiovascular: Satisfactory opacification of the pulmonary arteries to the segmental level. No evide

## 2022-03-06 ENCOUNTER — Emergency Department (HOSPITAL_COMMUNITY): Payer: Medicaid Other

## 2022-03-06 ENCOUNTER — Other Ambulatory Visit: Payer: Self-pay

## 2022-03-06 ENCOUNTER — Emergency Department (HOSPITAL_COMMUNITY)
Admission: EM | Admit: 2022-03-06 | Discharge: 2022-03-06 | Disposition: A | Discharge status: Home / Self Care | Payer: Medicaid Other | Attending: Emergency Medicine | Admitting: Emergency Medicine

## 2022-03-06 ENCOUNTER — Encounter (HOSPITAL_COMMUNITY): Payer: Self-pay

## 2022-03-06 DIAGNOSIS — F17210 Nicotine dependence, cigarettes, uncomplicated: Secondary | ICD-10-CM | POA: Insufficient documentation

## 2022-03-06 DIAGNOSIS — R0789 Other chest pain: Secondary | ICD-10-CM | POA: Insufficient documentation

## 2022-03-06 DIAGNOSIS — J449 Chronic obstructive pulmonary disease, unspecified: Secondary | ICD-10-CM | POA: Diagnosis not present

## 2022-03-06 DIAGNOSIS — Z79899 Other long term (current) drug therapy: Secondary | ICD-10-CM | POA: Diagnosis not present

## 2022-03-06 DIAGNOSIS — D72829 Elevated white blood cell count, unspecified: Secondary | ICD-10-CM | POA: Diagnosis not present

## 2022-03-06 DIAGNOSIS — Z7951 Long term (current) use of inhaled steroids: Secondary | ICD-10-CM | POA: Insufficient documentation

## 2022-03-06 DIAGNOSIS — R079 Chest pain, unspecified: Secondary | ICD-10-CM

## 2022-03-06 DIAGNOSIS — R0602 Shortness of breath: Secondary | ICD-10-CM | POA: Insufficient documentation

## 2022-03-06 LAB — BASIC METABOLIC PANEL
Anion gap: 14 (ref 5–15)
BUN: 21 mg/dL — ABNORMAL HIGH (ref 6–20)
CO2: 26 mmol/L (ref 22–32)
Calcium: 9 mg/dL (ref 8.9–10.3)
Chloride: 101 mmol/L (ref 98–111)
Creatinine, Ser: 0.85 mg/dL (ref 0.61–1.24)
GFR, Estimated: >60 mL/min (ref >60)
Glucose, Bld: 81 mg/dL (ref 70–99)
Potassium: 3.7 mmol/L (ref 3.5–5.1)
Sodium: 141 mmol/L (ref 135–145)

## 2022-03-06 LAB — CBC
HCT: 47.2 % (ref 39.0–52.0)
Hemoglobin: 14.3 g/dL (ref 13.0–17.0)
MCH: 31.3 pg (ref 26.0–34.0)
MCHC: 30.3 g/dL (ref 30.0–36.0)
MCV: 103.3 fL — ABNORMAL HIGH (ref 80.0–100.0)
Platelets: 170 10*3/uL (ref 150–400)
RBC: 4.57 MIL/uL (ref 4.22–5.81)
RDW: 15.2 % (ref 11.5–15.5)
WBC: 15.5 10*3/uL — ABNORMAL HIGH (ref 4.0–10.5)
nRBC: 0 % (ref 0.0–0.2)

## 2022-03-06 LAB — TROPONIN I (HIGH SENSITIVITY)
Troponin I (High Sensitivity): 15 ng/L (ref <18)
Troponin I (High Sensitivity): 16 ng/L (ref <18)

## 2022-03-06 IMAGING — DX DG CHEST 2V
2 series · 3 of 3 positions shown · non-contrast
Comparison: Chest x-ray [DATE].

CLINICAL DATA: Chest pain.

EXAM:
CHEST - 2 VIEW

[chest lat]
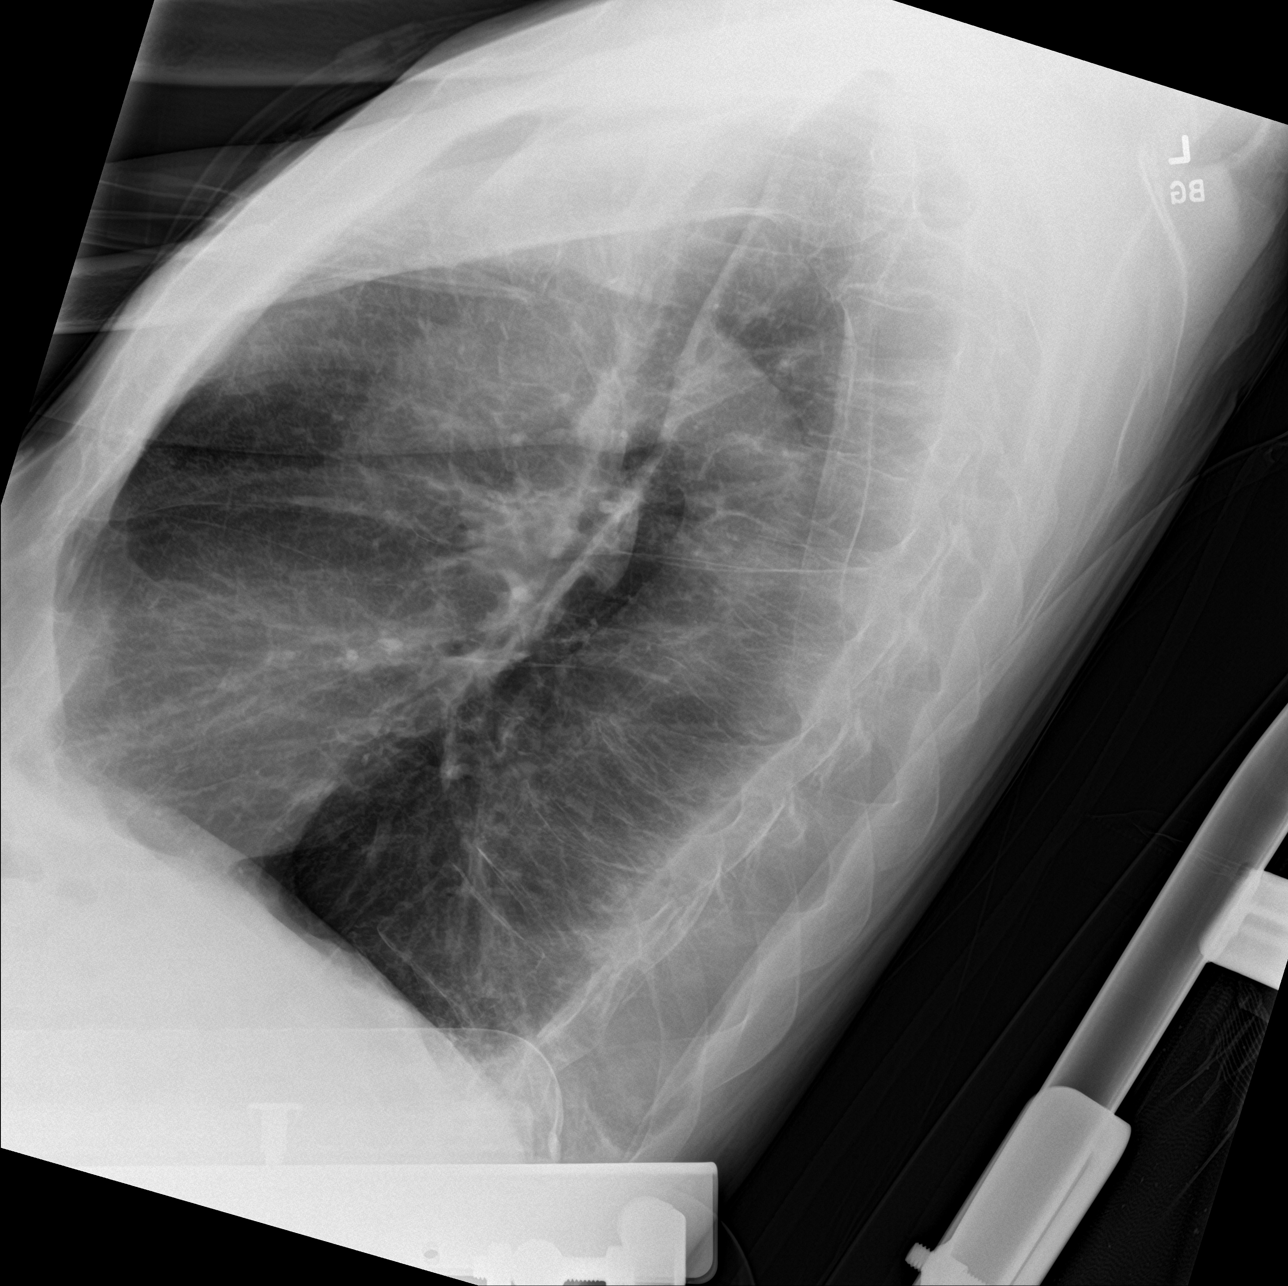

[Series 3: chest ap · 0.14mm/px · 2 of 2 slices shown]
[im 1/2]
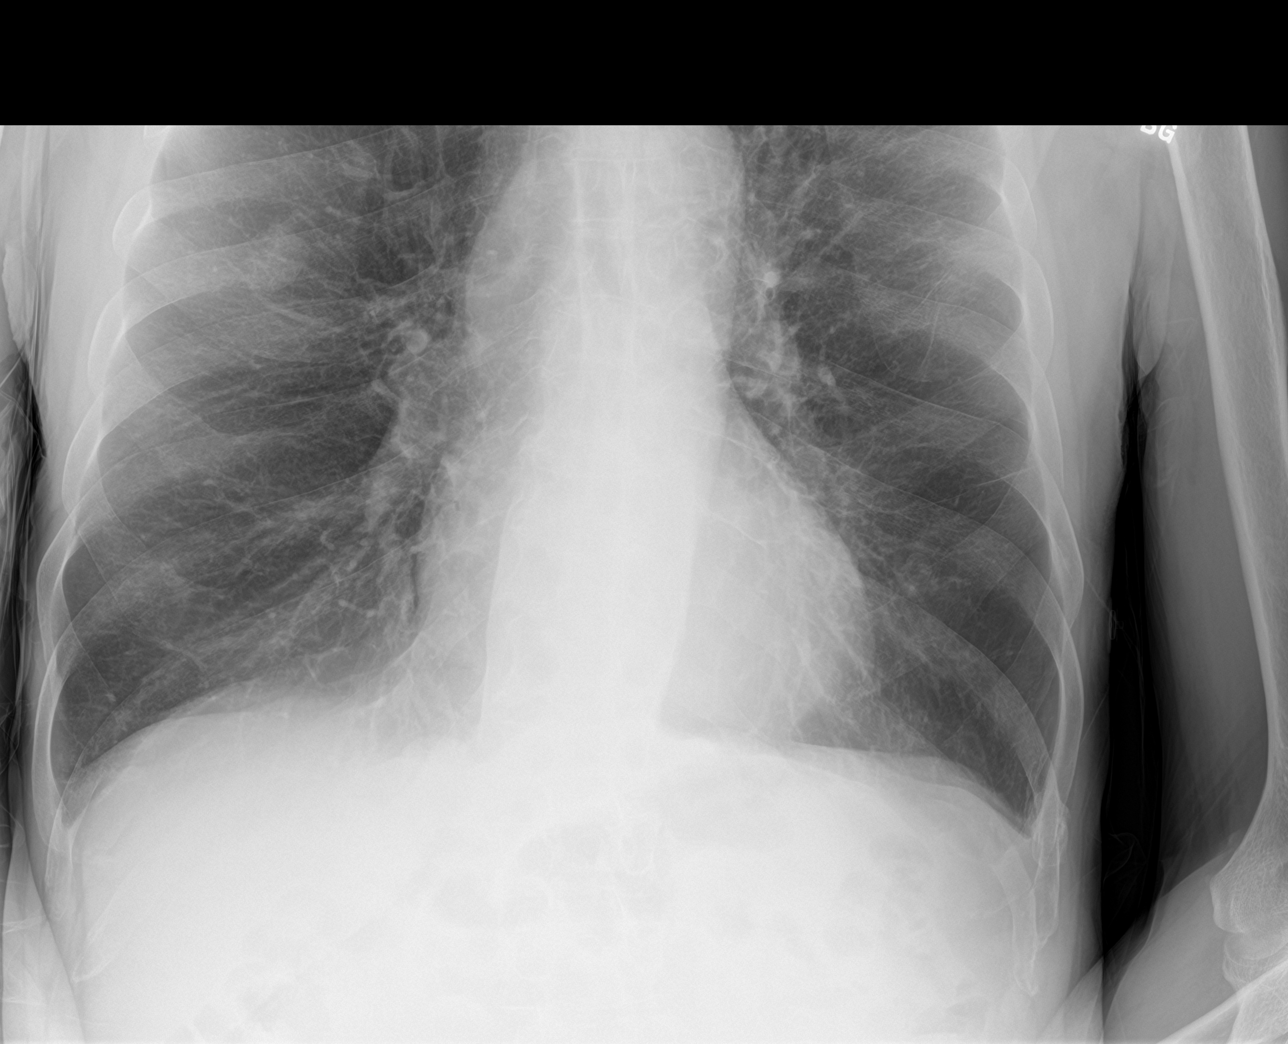
[im 2/2]
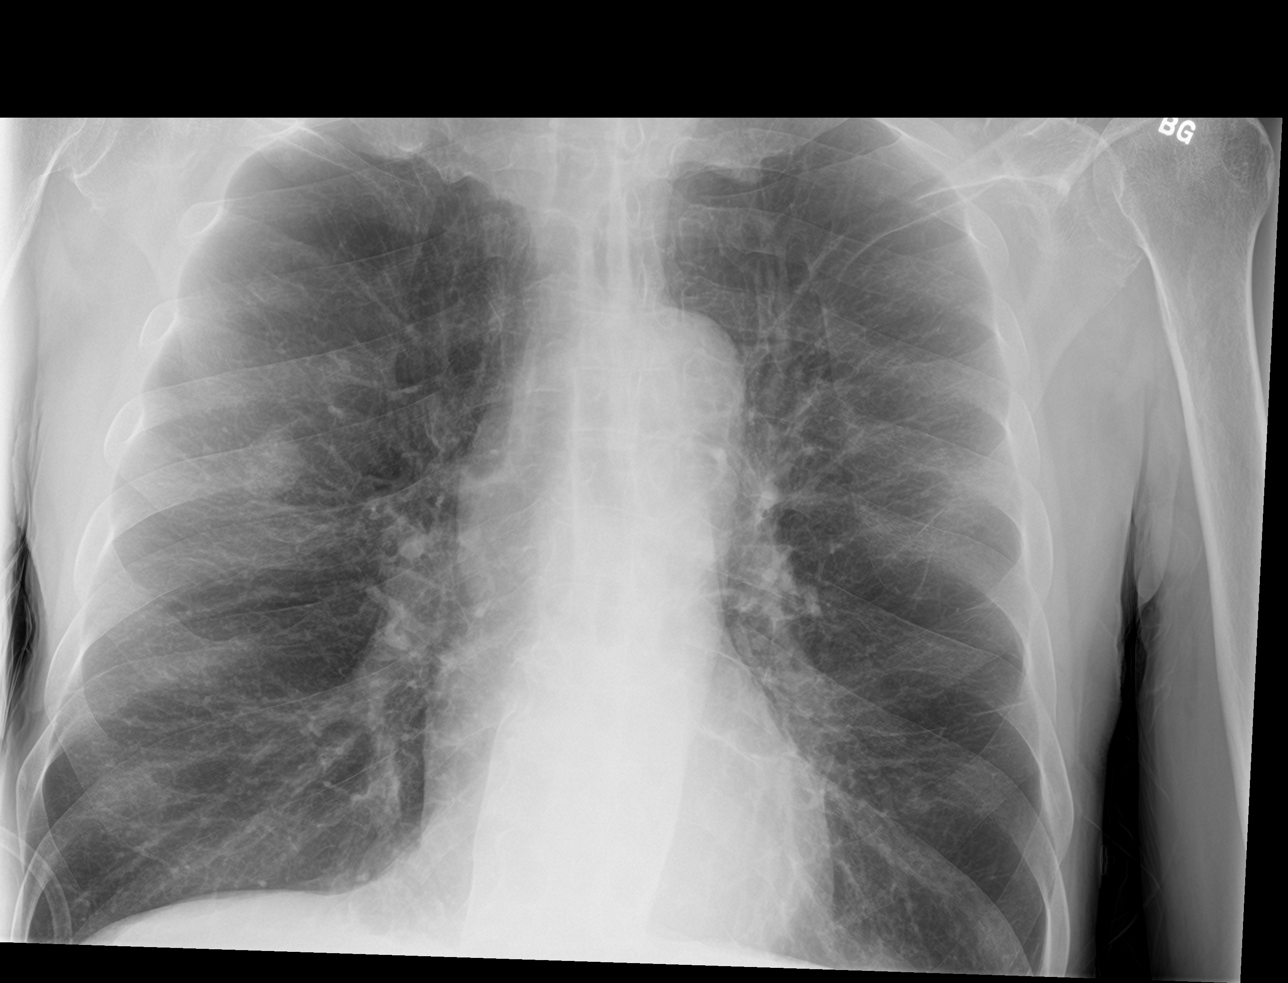

[3 of 3 positions shown; findings below may reference images not displayed]

FINDINGS: No consolidation. No visible pleural effusions or pneumothorax.
Hyperinflation. Remote right rib fracture bony callus.
IMPRESSION: Hyperinflation without acute abnormality.

## 2022-03-06 MED ORDER — ASPIRIN 325 MG PO TABS
325.0000 mg | ORAL_TABLET | Freq: Every day | ORAL | Status: DC
  Administered 2022-03-06: 325 mg via ORAL
  Filled 2022-03-06: qty 1

## 2022-03-06 MED ORDER — MAGNESIUM SULFATE 2 GM/50ML IV SOLN
2.0000 g | Freq: Once | INTRAVENOUS | Status: AC
  Administered 2022-03-06: 2 g via INTRAVENOUS
  Filled 2022-03-06: qty 50

## 2022-03-06 MED ORDER — IPRATROPIUM-ALBUTEROL 0.5-2.5 (3) MG/3ML IN SOLN
3.0000 mL | Freq: Once | RESPIRATORY_TRACT | Status: AC
  Administered 2022-03-06: 3 mL via RESPIRATORY_TRACT
  Filled 2022-03-06: qty 3

## 2022-03-06 MED ORDER — METHYLPREDNISOLONE SODIUM SUCC 125 MG IJ SOLR
125.0000 mg | Freq: Once | INTRAMUSCULAR | Status: AC
  Administered 2022-03-06: 125 mg via INTRAVENOUS
  Filled 2022-03-06: qty 2

## 2022-03-06 MED ORDER — PREDNISONE 10 MG PO TABS: 20.0000 mg | ORAL_TABLET | Freq: Every day | ORAL | 0 refills | Status: DC

## 2022-03-06 NOTE — ED Triage Notes (Signed)
Pt reports cp that started last night.  Reports coughing and having difficulty catching his breath.  Reports pain worse with breathing.  Pt reports that he still smokes.  Resp even and unlabored.  On continuous 02 via [place] at 3-4 lpm at home.    ?

## 2022-03-06 NOTE — ED Provider Notes (Signed)
[facility] EMERGENCY DEPARTMENT Provider Note   CSN: [id number] Arrival date & time: 03/06/22  1304     History  Chief Complaint  Patient presents with   Chest Pain    NOX TALENT is a 61 y.o. male.   Chest Pain  Patient presents with chest pain x1 day.  Started last night, its been constant.  It is well localized to the left side of his chest, does not radiate elsewhere.  It is worse when he breathes, also worse with any movement or cough or pushing on that spot on his chest.  No associated nausea or vomiting, patient does feel short of breath but states he always feels short of breath due to COPD.  He is on 3 L nasal cannula at baseline, currently smoking cigarettes.   Home Medications Prior to Admission medications   Medication Sig Start Date End Date Taking? Authorizing Provider  acetaminophen (TYLENOL) 325 MG tablet Take 2 tablets (650 mg total) by mouth every 6 (six) hours as needed for mild pain (or Fever >/= 101). 10/30/21   [name], MD  albuterol (PROVENTIL) (2.5 MG/3ML) 0.083% nebulizer solution Take 3 mLs (2.5 mg total) by nebulization every 4 (four) hours as needed for wheezing or shortness of breath. 12/12/21 12/12/22  [name], MD  albuterol (VENTOLIN HFA) 108 (90 Base) MCG/ACT inhaler Inhale 2 puffs into the lungs every 6 (six) hours as needed for wheezing or shortness of breath. 01/09/22   [name], Pratik D, DO  atorvastatin (LIPITOR) 40 MG tablet TAKE ONE TABLET BY MOUTH DAILY Patient taking differently: Take 40 mg by mouth daily. 07/06/20   [name], MD  benztropine (COGENTIN) 0.5 MG tablet Take 0.5 mg by mouth at bedtime.    [provider]  Budeson-Glycopyrrol-Formoterol (BREZTRI AEROSPHERE) 160-9-4.8 MCG/ACT AERO Inhale 2 puffs into the lungs in the morning and at bedtime. 12/12/21   [name], MD  cetirizine (ZYRTEC) 10 MG tablet Take 10 mg by mouth daily.    [provider]  diclofenac Sodium (VOLTAREN) 1 % GEL Apply 2 g  topically 4 (four) times daily.    [provider]  dorzolamide (TRUSOPT) 2 % ophthalmic solution Place 1 drop into the left eye 2 (two) times daily. 12/12/20   [provider]  guaiFENesin 200 MG tablet Take 2 tablets (400 mg total) by mouth every 6 (six) hours as needed for cough or to loosen phlegm. 03/02/22   Orson Eva, MD  hydrochlorothiazide (HYDRODIURIL) 25 MG tablet Take 0.5 tablets (12.5 mg total) by mouth daily. 10/30/21   [name], MD  hydrOXYzine (ATARAX) 25 MG tablet Take 1 tablet (25 mg total) by mouth 3 (three) times daily as needed for anxiety or nausea. 10/30/21   Emokpae, Courage, MD  latanoprost (XALATAN) 0.005 % ophthalmic solution Place 1 drop into both eyes at bedtime. 05/24/19   [provider]  losartan (COZAAR) 100 MG tablet Take 100 mg by mouth daily.    [provider]  loxapine (LOXITANE) 10 MG capsule Take 10 mg by mouth at bedtime.     [provider]  mirtazapine (REMERON) 30 MG tablet Take 1 tablet (30 mg total) by mouth at bedtime. 10/30/21   [name], MD  Multiple Vitamin (MULTIVITAMIN WITH MINERALS) TABS tablet Take 1 tablet by mouth daily. 10/31/21   [name], MD  OXYGEN Inhale 3-4 L into the lungs as directed. 3-4 liters with sleep and exertion as needed for low oxygen  [provider]  pantoprazole (PROTONIX) 40 MG tablet Take 1 tablet (40 mg total) by mouth daily. 10/30/21   [name], MD  polyethylene glycol (MIRALAX / GLYCOLAX) 17 g packet TAKE 17 GRAMS BY MOUTH DAILY Patient taking differently: Take 17 g by mouth daily as needed for mild constipation. 03/09/20   [name], MD  predniSONE (DELTASONE) 10 MG tablet Take 6 tablets (60 mg total) by mouth daily with breakfast. Decrease by 1 tablet every 2 days 03/03/22   Tat, Shanon Brow, MD  risperiDONE (RISPERDAL) 2 MG tablet Take 1 tablet (2 mg total) by mouth at bedtime. 10/30/21   [name], MD  senna-docusate (SENOKOT-S) 8.6-50 MG  tablet Take 2 tablets by mouth at bedtime. 10/30/21   [name], MD      Allergies    Penicillins and Levocetirizine    Review of Systems   Review of Systems  Cardiovascular:  Positive for chest pain.   Physical Exam Updated Vital Signs BP 127/89   Pulse 79   Temp 98 F (36.7 C)   Resp 18   Ht 5\' 11"  (1.803 m)   Wt 65.8 kg   SpO2 100%   BMI 20.22 kg/m  Physical Exam Vitals and nursing note reviewed. Exam conducted with a chaperone present.  Constitutional:      Appearance: Normal appearance. He is ill-appearing.     Comments: Chronically ill-appearing  HENT:     Head: Normocephalic and atraumatic.  Eyes:     General: No scleral icterus.       Right eye: No discharge.        Left eye: No discharge.     Extraocular Movements: Extraocular movements intact.     Pupils: Pupils are equal, round, and reactive to light.  Cardiovascular:     Rate and Rhythm: Normal rate and regular rhythm.     Pulses: Normal pulses.     Heart sounds: Normal heart sounds. No murmur heard.   No friction rub. No gallop.  Pulmonary:     Effort: Pulmonary effort is normal. No respiratory distress.     Breath sounds: Normal breath sounds.  Chest:     Chest wall: Tenderness present.     Comments: Pinpoint reproducible chest wall tenderness Abdominal:     General: Abdomen is flat. Bowel sounds are normal. There is no distension.     Palpations: Abdomen is soft.     Tenderness: There is no abdominal tenderness.  Skin:    General: Skin is warm and dry.     Coloration: Skin is not jaundiced.  Neurological:     Mental Status: He is alert. Mental status is at baseline.     Coordination: Coordination normal.    ED Results / Procedures / Treatments   Labs (all labs ordered are listed, but only abnormal results are displayed) Labs Reviewed  BASIC METABOLIC PANEL - Abnormal; Notable for the following components:      Result Value   BUN 21 (*)    All other components within normal limits   CBC - Abnormal; Notable for the following components:   WBC 15.5 (*)    MCV 103.3 (*)    All other components within normal limits  TROPONIN I (HIGH SENSITIVITY)  TROPONIN I (HIGH SENSITIVITY)    EKG EKG Interpretation  Date/Time:  Wednesday Mar 06 2022 13:13:52 EDT Ventricular Rate:  85 PR Interval:  144 QRS Duration: 68 QT Interval:  350 QTC Calculation: 416 R Axis:   -88  Text Interpretation: Normal sinus rhythm with sinus arrhythmia Right atrial enlargement Left axis deviation Anterior infarct , age undetermined Abnormal ECG No significant change since prior 5/23 Confirmed by Aletta Edouard [phone]) on 03/06/2022 1:44:36 PM  Radiology DG Chest 2 View  Result Date: 03/06/2022 CLINICAL DATA:  Chest pain. EXAM: CHEST - 2 VIEW COMPARISON:  Chest x-ray May 3, 23. FINDINGS: No consolidation. No visible pleural effusions or pneumothorax. Hyperinflation. Remote right rib fracture bony callus. IMPRESSION: Hyperinflation without acute abnormality. Electronically Signed   By: Margaretha Sheffield M.D.   On: 03/06/2022 14:09    Procedures Procedures    Medications Ordered in ED Medications  aspirin tablet 325 mg (325 mg Oral Given 03/06/22 1551)  magnesium sulfate IVPB 2 g 50 mL (2 g Intravenous New Bag/Given 03/06/22 1754)  methylPREDNISolone sodium succinate (SOLU-MEDROL) 125 mg/2 mL injection 125 mg (125 mg Intravenous Given 03/06/22 1750)  ipratropium-albuterol (DUONEB) 0.5-2.5 (3) MG/3ML nebulizer solution 3 mL (3 mLs Nebulization Given 03/06/22 1650)    ED Course/ Medical Decision Making/ A&P                           Medical Decision Making Risk OTC drugs. Prescription drug management.   This patient presents to the ED for concern of chest pain, this involves an extensive number of treatment options, and is a complaint that carries with it a high risk of complications and morbidity.  The differential diagnosis includes ACS, pneumonia, pneumothorax, COPD  exacerbation   Additional history obtained:    External records reviewed, patient does have a history of COPD and exacerbations requiring admission.    Lab Tests:  I ordered, viewed, and personally interpreted labs.  The pertinent results include: Leukocytosis of 15.5, actually improved from 7 days ago when last checked.  No AKI or gross electrolyte derangement, delta troponin 1 overall reassuring.    Imaging Studies ordered:  I directly visualized the CXR, which showed no acute process  I agree with the radiologist interpretation    ECG/Cardiac monitoring:   Per my interpretation, EKG shows Sinus arrhythmia, no ischemic findings.  The patient was maintained on a cardiac monitor.  Visualized monitor strip which showed NSR per my interpretation.    Medicines ordered and prescription drug management:  I ordered medication including: Not given empirically due to history of QT prolongation although not prolonged here on the EKG.  Aspirin, Solu-Medrol and DuoNeb ordered.  I have reviewed the patients home medicines and have made adjustments as needed   Test Considered:  Patient ACS score is 3.  Presentation is also more consistent with costochondritis than ACS especially in the absence of ischemic findings on EKG and negative delta troponin.   Reevaluation:  After the interventions noted above, I reevaluated the patient and found patient feels improved, wheezing has improved.  Pain resolved.   Problems addressed / ED Course: Patient presented with pinpoint reproducible musculoskeletal pain on exam.  Negative delta troponin without ischemic findings on EKG as discussed in the course above.  Patient's lungs were initially wheezing, this improved with administration of Solu-Medrol, mag and DuoNeb.  Will discharge with steroids, do not feel additional work-up needed at this time especially given resolution of his symptoms.  Discharged in stable condition.   Social Determinants  of Health:    Disposition:   After consideration of the diagnostic results and the patients response to treatment, I feel that the patent would benefit from D/C.  Final Clinical Impression(s) / ED Diagnoses Final diagnoses:  None    Rx / DC Orders ED Discharge Orders     None         Sherrill Raring, Hershal Coria 03/06/22 1839    Luna Fuse, MD 03/15/22 2245

## 2022-03-06 NOTE — Discharge Instructions (Addendum)
Take 40 mg of prednisone for the next 5 days. ?Follow-up with your primary this week later for reevaluation.  Return if things change or worsen. ?

## 2022-03-11 ENCOUNTER — Encounter (HOSPITAL_COMMUNITY): Payer: Self-pay | Admitting: Emergency Medicine

## 2022-03-11 ENCOUNTER — Emergency Department (HOSPITAL_COMMUNITY): Payer: Medicaid Other

## 2022-03-11 ENCOUNTER — Emergency Department (HOSPITAL_COMMUNITY)
Admission: EM | Admit: 2022-03-11 | Discharge: 2022-03-11 | Disposition: A | Discharge status: Home / Self Care | Payer: Medicaid Other | Attending: Emergency Medicine | Admitting: Emergency Medicine

## 2022-03-11 DIAGNOSIS — R0602 Shortness of breath: Secondary | ICD-10-CM | POA: Diagnosis present

## 2022-03-11 DIAGNOSIS — Z7951 Long term (current) use of inhaled steroids: Secondary | ICD-10-CM | POA: Insufficient documentation

## 2022-03-11 DIAGNOSIS — F17200 Nicotine dependence, unspecified, uncomplicated: Secondary | ICD-10-CM | POA: Insufficient documentation

## 2022-03-11 DIAGNOSIS — J449 Chronic obstructive pulmonary disease, unspecified: Secondary | ICD-10-CM | POA: Insufficient documentation

## 2022-03-11 IMAGING — DX DG CHEST 1V PORT
2 series · 2 of 2 positions shown · non-contrast
Comparison: Five days ago

CLINICAL DATA: Shortness of breath

EXAM:
PORTABLE CHEST 1 VIEW

[chest ap (1 of 2)]
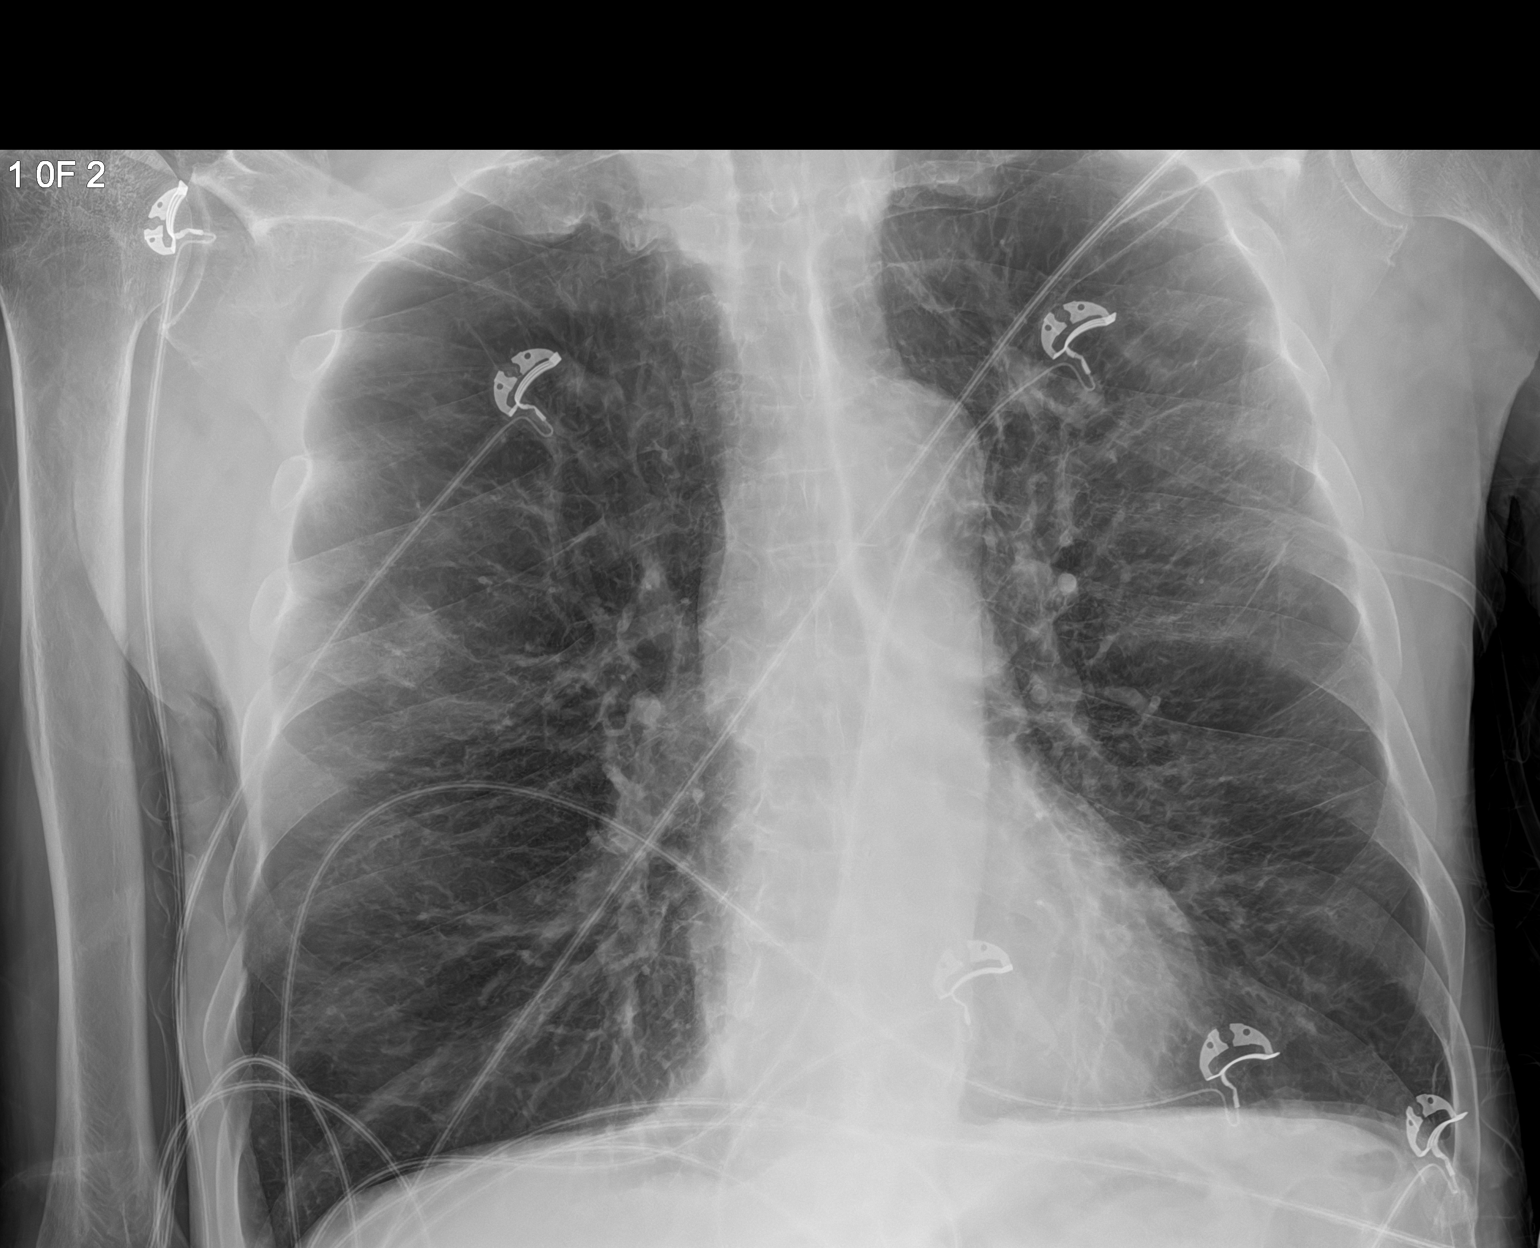

[chest ap (2 of 2)]
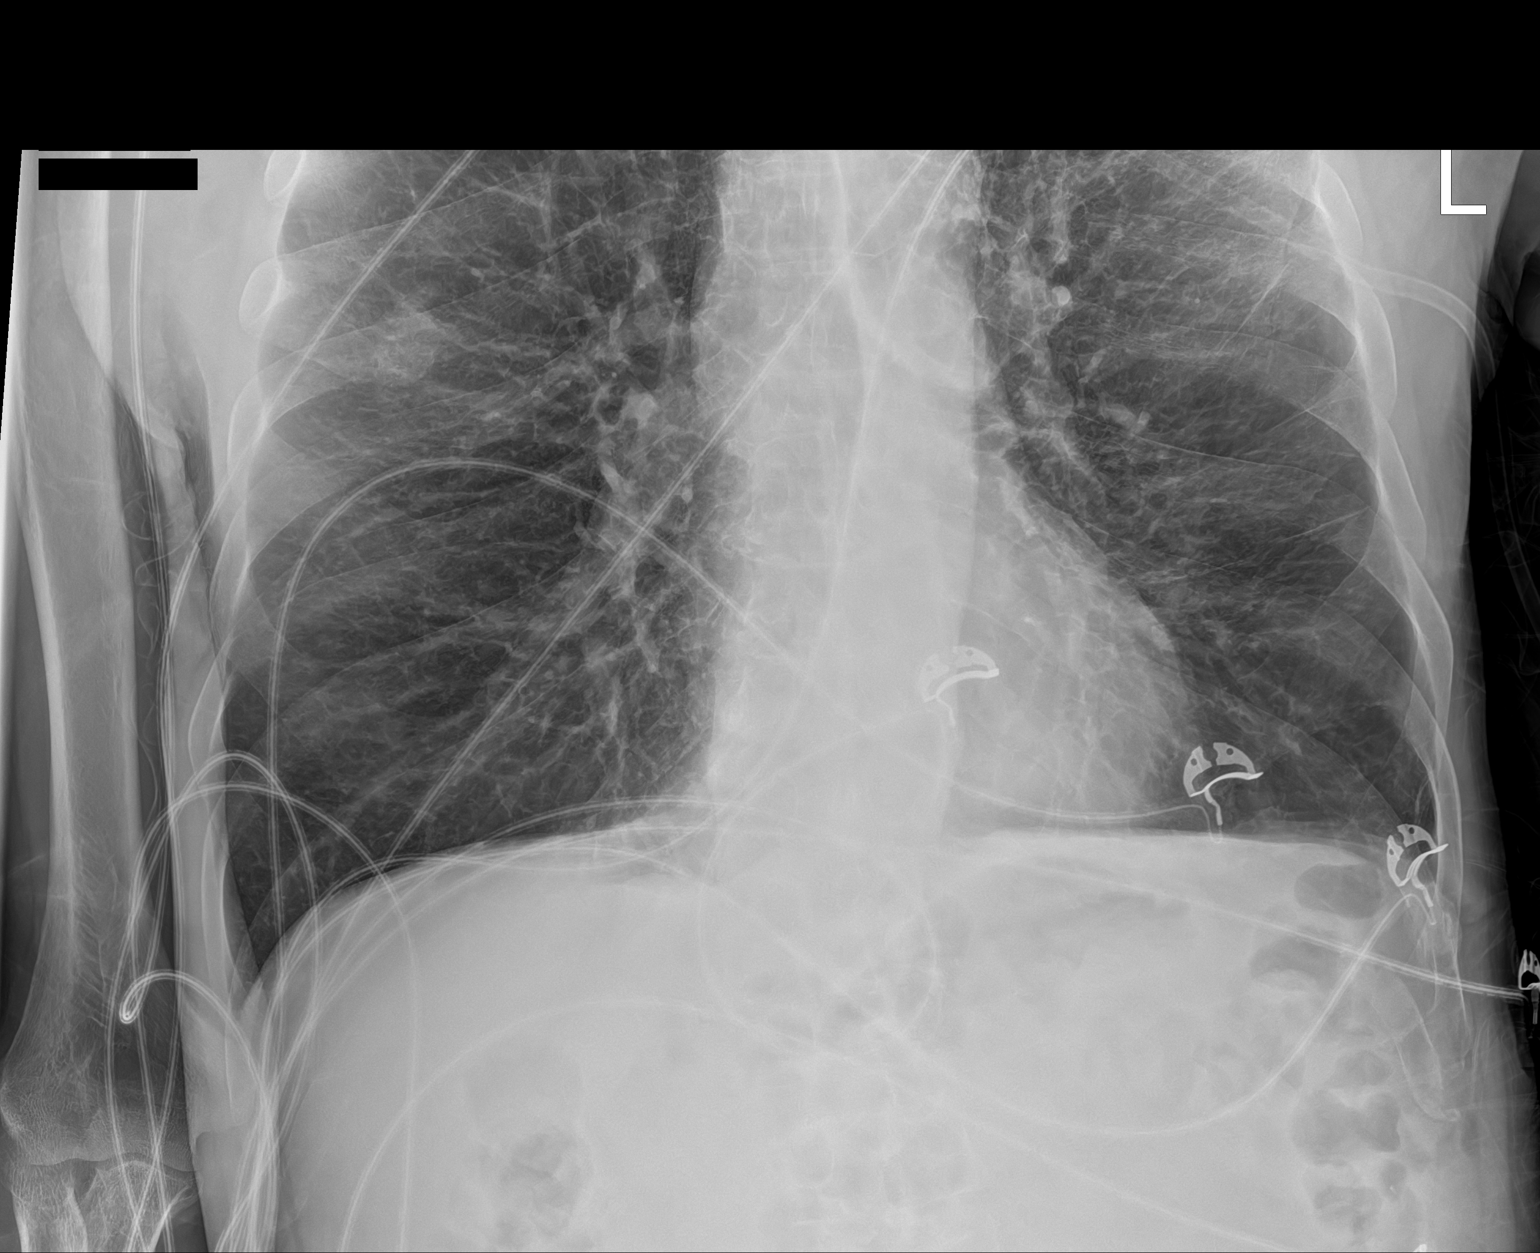

[2 of 2 positions shown; findings below may reference images not displayed]

FINDINGS: Hyperinflation and architectural distortion from emphysema. Nodular
density over the right chest is from rib callus by [DATE] CT.
There is no edema, consolidation, effusion, or pneumothorax. Normal
heart size and mediastinal contours.
IMPRESSION: COPD without acute superimposed finding.

## 2022-03-11 MED ORDER — PREDNISONE 20 MG PO TABS: 20.0000 mg | ORAL_TABLET | Freq: Every day | ORAL | 0 refills | Status: DC

## 2022-03-11 MED ORDER — ALBUTEROL SULFATE (2.5 MG/3ML) 0.083% IN NEBU: 2.5000 mg | INHALATION_SOLUTION | Freq: Once | RESPIRATORY_TRACT | Status: AC

## 2022-03-11 MED ORDER — ALBUTEROL SULFATE (2.5 MG/3ML) 0.083% IN NEBU
INHALATION_SOLUTION | RESPIRATORY_TRACT | Status: AC
  Administered 2022-03-11: 2.5 mg via RESPIRATORY_TRACT
  Filled 2022-03-11: qty 3

## 2022-03-11 MED ORDER — IPRATROPIUM BROMIDE 0.02 % IN SOLN: 0.5000 mg | Freq: Once | RESPIRATORY_TRACT | Status: DC

## 2022-03-11 MED ORDER — IPRATROPIUM-ALBUTEROL 0.5-2.5 (3) MG/3ML IN SOLN: 3.0000 mL | Freq: Once | RESPIRATORY_TRACT | Status: AC

## 2022-03-11 MED ORDER — IPRATROPIUM-ALBUTEROL 0.5-2.5 (3) MG/3ML IN SOLN
RESPIRATORY_TRACT | Status: AC
Start: 2022-03-11 — End: 2022-03-11
  Administered 2022-03-11: 3 mL via RESPIRATORY_TRACT
  Filled 2022-03-11: qty 3

## 2022-03-11 MED ORDER — ALBUTEROL SULFATE (2.5 MG/3ML) 0.083% IN NEBU: 5.0000 mg | INHALATION_SOLUTION | Freq: Once | RESPIRATORY_TRACT | Status: DC

## 2022-03-11 NOTE — ED Notes (Signed)
CSW spoke with pt who states that his O2 concentrator is not working properly. Pt confirms his O2 provider is [employer]. CSW reached out to [place] with [employer] who states that she can put in a service ticket to have someone come out to pts home and check all equipment.  ? ?If pt goes home via [facility] transportation he will need O2 tank delivered from [employer]. CSW will confirm with [employer] if tank is needed. ?

## 2022-03-11 NOTE — ED Triage Notes (Signed)
Pt c/o SOB due to his home O2 machine hasn't worked properly for the past 2 days. Pt states he called the company but they haven't been out to fix it yet. Pt arrives on baseline O2 requirements of 4L [place] sats 96%. ?

## 2022-03-11 NOTE — Discharge Instructions (Signed)
Follow-up with your family doctor within a week ?

## 2022-03-11 NOTE — ED Provider Notes (Signed)
?[facility] ?Provider Note ? ? ?CSN: [id number] ?Arrival date & time: 03/11/22  0443 ? ?  ? ?History ? ?Chief Complaint  ?Patient presents with  ? Shortness of Breath  ? ? ?Tony Sullivan is a 61 y.o. male. ? ?The history is provided by the patient.  ?Shortness of Breath ?Severity:  Moderate ?Onset quality:  Gradual ?Chronicity:  Recurrent ?Relieved by:  Nothing ?Associated symptoms: cough   ?Associated symptoms: no fever   ?Patient with history of COPD and chronic respiratory failure presents with shortness of breath.  He reports over the past several days has been able to use his nebulizer due to lack of concentrator.  He does have his oxygen at home.  Due to lack of nebulized treatments, he has had increasing shortness of breath and cough.  He also has chest pain due to coughing.  No fevers. ?  ? ?Home Medications ?Prior to Admission medications   ?Medication Sig Start Date End Date Taking? Authorizing Provider  ?acetaminophen (TYLENOL) 325 MG tablet Take 2 tablets (650 mg total) by mouth every 6 (six) hours as needed for mild pain (or Fever >/= 101). 10/30/21   [name], MD  ?albuterol (PROVENTIL) (2.5 MG/3ML) 0.083% nebulizer solution Take 3 mLs (2.5 mg total) by nebulization every 4 (four) hours as needed for wheezing or shortness of breath. 12/12/21 12/12/22  [name], MD  ?albuterol (VENTOLIN HFA) 108 (90 Base) MCG/ACT inhaler Inhale 2 puffs into the lungs every 6 (six) hours as needed for wheezing or shortness of breath. 01/09/22   [name], Pratik D, DO  ?atorvastatin (LIPITOR) 40 MG tablet TAKE ONE TABLET BY MOUTH DAILY ?Patient taking differently: Take 40 mg by mouth daily. 07/06/20   [name], MD  ?benztropine (COGENTIN) 0.5 MG tablet Take 0.5 mg by mouth at bedtime.    [provider]  ?Budeson-Glycopyrrol-Formoterol (BREZTRI AEROSPHERE) 160-9-4.8 MCG/ACT AERO Inhale 2 puffs into the lungs in the morning and at bedtime. 12/12/21   [name], MD  ?cetirizine  (ZYRTEC) 10 MG tablet Take 10 mg by mouth daily.    [provider]  ?diclofenac Sodium (VOLTAREN) 1 % GEL Apply 2 g topically 4 (four) times daily.    [provider]  ?dorzolamide (TRUSOPT) 2 % ophthalmic solution Place 1 drop into the left eye 2 (two) times daily. 12/12/20   [provider]  ?guaiFENesin 200 MG tablet Take 2 tablets (400 mg total) by mouth every 6 (six) hours as needed for cough or to loosen phlegm. 03/02/22   Orson Eva, MD  ?hydrochlorothiazide (HYDRODIURIL) 25 MG tablet Take 0.5 tablets (12.5 mg total) by mouth daily. 10/30/21   [name], MD  ?hydrOXYzine (ATARAX) 25 MG tablet Take 1 tablet (25 mg total) by mouth 3 (three) times daily as needed for anxiety or nausea. 10/30/21   [name], MD  ?latanoprost (XALATAN) 0.005 % ophthalmic solution Place 1 drop into both eyes at bedtime. 05/24/19   [provider]  ?losartan (COZAAR) 100 MG tablet Take 100 mg by mouth daily.    [provider]  ?loxapine (LOXITANE) 10 MG capsule Take 10 mg by mouth at bedtime.     [provider]  ?mirtazapine (REMERON) 30 MG tablet Take 1 tablet (30 mg total) by mouth at bedtime. 10/30/21   [name], MD  ?Multiple Vitamin (MULTIVITAMIN WITH MINERALS) TABS tablet Take 1 tablet by mouth daily. 10/31/21   [name], MD  ?OXYGEN Inhale 3-4 L into  the lungs as directed. 3-4 liters with sleep and exertion as needed for low oxygen    [provider]  ?pantoprazole (PROTONIX) 40 MG tablet Take 1 tablet (40 mg total) by mouth daily. 10/30/21   [name], MD  ?polyethylene glycol (MIRALAX / GLYCOLAX) 17 g packet TAKE 17 GRAMS BY MOUTH DAILY ?Patient taking differently: Take 17 g by mouth daily as needed for mild constipation. 03/09/20   [name], MD  ?predniSONE (DELTASONE) 10 MG tablet Take 2 tablets (20 mg total) by mouth daily. 03/06/22   Sherrill Raring, PA-C  ?risperiDONE (RISPERDAL) 2 MG tablet Take 1 tablet (2 mg total) by mouth at  bedtime. 10/30/21   [name], MD  ?senna-docusate (SENOKOT-S) 8.6-50 MG tablet Take 2 tablets by mouth at bedtime. 10/30/21   [name], MD  ?   ? ?Allergies    ?Penicillins and Levocetirizine   ? ?Review of Systems   ?Review of Systems  ?Constitutional:  Negative for fever.  ?Respiratory:  Positive for cough and shortness of breath.   ? ?Physical Exam ?Updated Vital Signs ?BP 107/78   Pulse 87   Temp 98.6 ?F (37 ?C) (Oral)   Resp 14   Ht 1.803 m (5\' 11" )   Wt 65.8 kg   SpO2 100%   BMI 20.22 kg/m?  ?Physical Exam ?CONSTITUTIONAL: Chronically ill-appearing ?HEAD: Normocephalic/atraumatic ?EYES: EOMI ?ENMT: Mucous membranes moist ?NECK: supple no meningeal signs ?CV: S1/S2 noted, no murmurs/rubs/gallops noted ?LUNGS: Decreased breath sounds bilaterally, no acute distress.  Patient on nasal cannula ?ABDOMEN: soft ?NEURO: Pt is awake/alert/appropriate, moves all extremitiesx4.  No facial droop.   ?EXTREMITIES: pulses normal/equal, full ROM, symmetric edema noted to both ankles ?SKIN: warm, color normal ?PSYCH: no abnormalities of mood noted, alert and oriented to situation ? ?ED Results / Procedures / Treatments   ?Labs ?(all labs ordered are listed, but only abnormal results are displayed) ?Labs Reviewed - No data to display ? ?EKG ?EKG Interpretation ? ?Date/Time:  Monday Mar 11 2022 05:14:00 EDT ?Ventricular Rate:  86 ?PR Interval:  145 ?QRS Duration: 82 ?QT Interval:  370 ?QTC Calculation: 443 ?R Axis:   -80 ?Text Interpretation: Sinus rhythm Atrial premature complex Biatrial enlargement Left anterior fascicular block Anterior infarct, old Confirmed by Ripley Fraise [phone]) on 03/11/2022 5:44:06 AM ? ?Radiology ?DG Chest Portable 1 View ? ?Result Date: 03/11/2022 ?CLINICAL DATA:  Shortness of breath EXAM: PORTABLE CHEST 1 VIEW COMPARISON:  Five days ago FINDINGS: Hyperinflation and architectural distortion from emphysema. Nodular density over the right chest is from rib callus by April 2023 CT.  There is no edema, consolidation, effusion, or pneumothorax. Normal heart size and mediastinal contours. IMPRESSION: COPD without acute superimposed finding. Electronically Signed   By: Jorje Guild M.D.   On: 03/11/2022 05:22   ? ?Procedures ?Procedures  ? ? ?Medications Ordered in ED ?Medications  ?albuterol (PROVENTIL) (2.5 MG/3ML) 0.083% nebulizer solution 5 mg (has no administration in time range)  ?ipratropium (ATROVENT) nebulizer solution 0.5 mg (has no administration in time range)  ? ? ?ED Course/ Medical Decision Making/ A&P ?Clinical Course as of 03/11/22 0718  ?Mon Mar 11, 2022  ?0718 Due to equipment issues at home, patient came to the ED.  He is now improved.  Plan to consult social work to help arrange for concentrator and nebulizer at home.  At this point he does not need to be admitted. Signed out to dr zammit at shift change [DW]  ?  ?Clinical Course User Index ?[  DW] Ripley Fraise, MD  ? ?                        ?Medical Decision Making ?Amount and/or Complexity of Data Reviewed ?Radiology: ordered. ?ECG/medicine tests: ordered. ? ?Risk ?Prescription drug management. ? ? ?This patient presents to the ED for concern of shortness of breath, this involves an extensive number of treatment options, and is a complaint that carries with it a high risk of complications and morbidity.  The differential diagnosis includes but is not limited to COPD exacerbation, pneumonia, acute coronary syndrome, pulmonary embolism ? ?Comorbidities that complicate the patient evaluation: ?Patient?s presentation is complicated by their history of COPD ? ?Social Determinants of Health: ?Patient?s  ongoing tobacco use   increases the complexity of managing their presentation ? ?Additional history obtained: ?Records reviewed previous admission documents ?Imaging Studies ordered: ?I ordered imaging studies including X-ray chest   ?I independently visualized and interpreted imaging which showed no acute finding ?I agree  with the radiologist interpretation ? ?Cardiac Monitoring: ?The patient was maintained on a cardiac monitor.  I personally viewed and interpreted the cardiac monitor which showed an underlying rhythm of:  sinus rhy

## 2022-03-11 NOTE — ED Notes (Signed)
EMS notified of need to transport home  ?

## 2022-03-13 ENCOUNTER — Encounter (HOSPITAL_COMMUNITY): Payer: Self-pay | Admitting: Emergency Medicine

## 2022-03-13 ENCOUNTER — Inpatient Hospital Stay (HOSPITAL_COMMUNITY)
Admission: EM | Admit: 2022-03-13 | Discharge: 2022-03-15 | DRG: 190 | Disposition: A | Discharge status: Home Health | Payer: Medicaid Other | Attending: Internal Medicine | Admitting: Internal Medicine

## 2022-03-13 ENCOUNTER — Emergency Department (HOSPITAL_COMMUNITY): Payer: Medicaid Other

## 2022-03-13 ENCOUNTER — Other Ambulatory Visit: Payer: Self-pay

## 2022-03-13 DIAGNOSIS — J9622 Acute and chronic respiratory failure with hypercapnia: Secondary | ICD-10-CM | POA: Diagnosis present

## 2022-03-13 DIAGNOSIS — Z79899 Other long term (current) drug therapy: Secondary | ICD-10-CM

## 2022-03-13 DIAGNOSIS — E782 Mixed hyperlipidemia: Secondary | ICD-10-CM | POA: Diagnosis present

## 2022-03-13 DIAGNOSIS — Z981 Arthrodesis status: Secondary | ICD-10-CM | POA: Diagnosis not present

## 2022-03-13 DIAGNOSIS — R262 Difficulty in walking, not elsewhere classified: Secondary | ICD-10-CM | POA: Diagnosis present

## 2022-03-13 DIAGNOSIS — J440 Chronic obstructive pulmonary disease with acute lower respiratory infection: Secondary | ICD-10-CM | POA: Diagnosis present

## 2022-03-13 DIAGNOSIS — Z7952 Long term (current) use of systemic steroids: Secondary | ICD-10-CM | POA: Diagnosis not present

## 2022-03-13 DIAGNOSIS — Z825 Family history of asthma and other chronic lower respiratory diseases: Secondary | ICD-10-CM

## 2022-03-13 DIAGNOSIS — Z9981 Dependence on supplemental oxygen: Secondary | ICD-10-CM

## 2022-03-13 DIAGNOSIS — Z8249 Family history of ischemic heart disease and other diseases of the circulatory system: Secondary | ICD-10-CM

## 2022-03-13 DIAGNOSIS — F32A Depression, unspecified: Secondary | ICD-10-CM | POA: Diagnosis present

## 2022-03-13 DIAGNOSIS — R079 Chest pain, unspecified: Secondary | ICD-10-CM | POA: Diagnosis not present

## 2022-03-13 DIAGNOSIS — K219 Gastro-esophageal reflux disease without esophagitis: Secondary | ICD-10-CM | POA: Diagnosis present

## 2022-03-13 DIAGNOSIS — I10 Essential (primary) hypertension: Secondary | ICD-10-CM | POA: Diagnosis present

## 2022-03-13 DIAGNOSIS — Z880 Allergy status to penicillin: Secondary | ICD-10-CM | POA: Diagnosis not present

## 2022-03-13 DIAGNOSIS — R7303 Prediabetes: Secondary | ICD-10-CM | POA: Diagnosis present

## 2022-03-13 DIAGNOSIS — J9811 Atelectasis: Secondary | ICD-10-CM | POA: Diagnosis present

## 2022-03-13 DIAGNOSIS — J449 Chronic obstructive pulmonary disease, unspecified: Secondary | ICD-10-CM | POA: Diagnosis not present

## 2022-03-13 DIAGNOSIS — F17210 Nicotine dependence, cigarettes, uncomplicated: Secondary | ICD-10-CM | POA: Diagnosis present

## 2022-03-13 DIAGNOSIS — J9621 Acute and chronic respiratory failure with hypoxia: Secondary | ICD-10-CM | POA: Diagnosis present

## 2022-03-13 DIAGNOSIS — E876 Hypokalemia: Secondary | ICD-10-CM | POA: Diagnosis present

## 2022-03-13 DIAGNOSIS — D72829 Elevated white blood cell count, unspecified: Secondary | ICD-10-CM | POA: Diagnosis present

## 2022-03-13 DIAGNOSIS — Z720 Tobacco use: Secondary | ICD-10-CM | POA: Diagnosis present

## 2022-03-13 DIAGNOSIS — Z832 Family history of diseases of the blood and blood-forming organs and certain disorders involving the immune mechanism: Secondary | ICD-10-CM

## 2022-03-13 DIAGNOSIS — F419 Anxiety disorder, unspecified: Secondary | ICD-10-CM | POA: Diagnosis present

## 2022-03-13 DIAGNOSIS — Z888 Allergy status to other drugs, medicaments and biological substances status: Secondary | ICD-10-CM

## 2022-03-13 DIAGNOSIS — J189 Pneumonia, unspecified organism: Secondary | ICD-10-CM | POA: Diagnosis present

## 2022-03-13 DIAGNOSIS — Z833 Family history of diabetes mellitus: Secondary | ICD-10-CM | POA: Diagnosis not present

## 2022-03-13 DIAGNOSIS — I2699 Other pulmonary embolism without acute cor pulmonale: Secondary | ICD-10-CM | POA: Diagnosis present

## 2022-03-13 DIAGNOSIS — R54 Age-related physical debility: Secondary | ICD-10-CM | POA: Diagnosis present

## 2022-03-13 LAB — BASIC METABOLIC PANEL
Anion gap: 8 (ref 5–15)
BUN: 17 mg/dL (ref 6–20)
CO2: 36 mmol/L — ABNORMAL HIGH (ref 22–32)
Calcium: 8.6 mg/dL — ABNORMAL LOW (ref 8.9–10.3)
Chloride: 98 mmol/L (ref 98–111)
Creatinine, Ser: 0.83 mg/dL (ref 0.61–1.24)
GFR, Estimated: >60 mL/min (ref >60)
Glucose, Bld: 96 mg/dL (ref 70–99)
Potassium: 2.9 mmol/L — ABNORMAL LOW (ref 3.5–5.1)
Sodium: 142 mmol/L (ref 135–145)

## 2022-03-13 LAB — RESPIRATORY PANEL BY PCR

## 2022-03-13 LAB — RAPID URINE DRUG SCREEN, HOSP PERFORMED
Amphetamines: NOT DETECTED
Barbiturates: NOT DETECTED
Benzodiazepines: NOT DETECTED
Cocaine: POSITIVE — AB
Opiates: POSITIVE — AB
Tetrahydrocannabinol: NOT DETECTED

## 2022-03-13 LAB — CBC
HCT: 42.2 % (ref 39.0–52.0)
Hemoglobin: 13.3 g/dL (ref 13.0–17.0)
MCH: 31.9 pg (ref 26.0–34.0)
MCHC: 31.5 g/dL (ref 30.0–36.0)
MCV: 101.2 fL — ABNORMAL HIGH (ref 80.0–100.0)
Platelets: 218 10*3/uL (ref 150–400)
RBC: 4.17 MIL/uL — ABNORMAL LOW (ref 4.22–5.81)
RDW: 14.6 % (ref 11.5–15.5)
WBC: 15.1 10*3/uL — ABNORMAL HIGH (ref 4.0–10.5)
nRBC: 0 % (ref 0.0–0.2)

## 2022-03-13 LAB — BRAIN NATRIURETIC PEPTIDE: B Natriuretic Peptide: 48 pg/mL (ref 0.0–100.0)

## 2022-03-13 LAB — TROPONIN I (HIGH SENSITIVITY)
Troponin I (High Sensitivity): 19 ng/L — ABNORMAL HIGH (ref <18)
Troponin I (High Sensitivity): 16 ng/L (ref <18)

## 2022-03-13 IMAGING — CT CT ANGIO CHEST
2 of 8 series · 13 of 36 positions shown · IV contrast (Omnipaque or Isovue)
Comparison: Portable chest [DATE] portable chest today, CTA
chest [DATE], CTA chest [DATE].
COMPARISON: Portable chest [DATE] portable chest today, CTA
chest [DATE], CTA chest [DATE].

Addendum:
CLINICAL DATA: Chest tightness and shortness of breath.

EXAM:
CT ANGIOGRAPHY CHEST WITH CONTRAST
TECHNIQUE: Multidetector CT imaging of the chest was performed using the
standard protocol during bolus administration of intravenous
contrast. Multiplanar CT image reconstructions and MIPs were
obtained to evaluate the vascular anatomy.

[Series 5: pe axial thins · axial · 0.66mm/px · z∈[+1437,+1682]mm · 12 of 350 slices shown]
[im 22/350  lung]
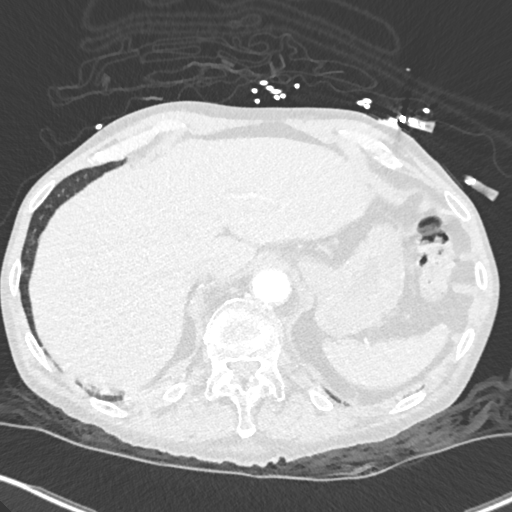
[im 44/350  mediastinal]
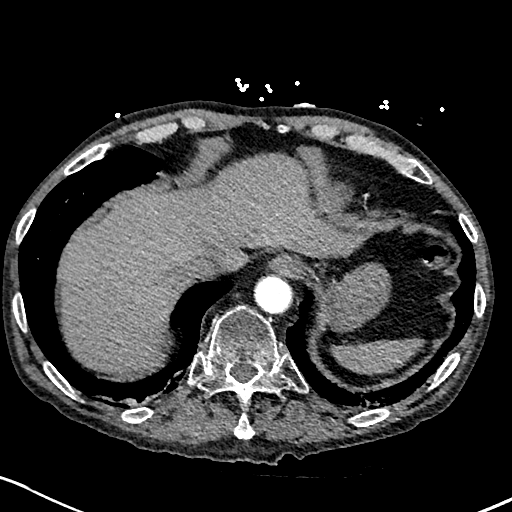
[im 88/350  lung]
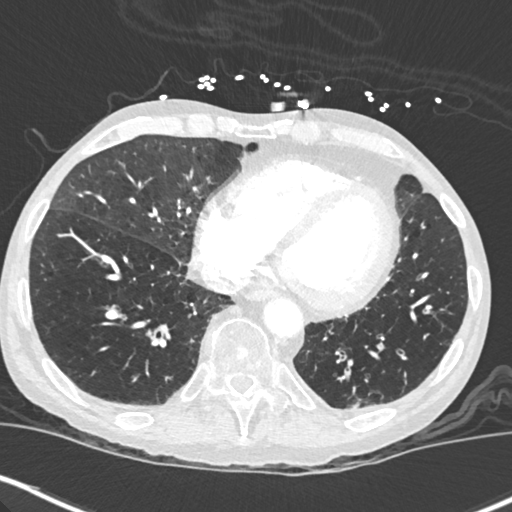
[im 110/350  mediastinal]
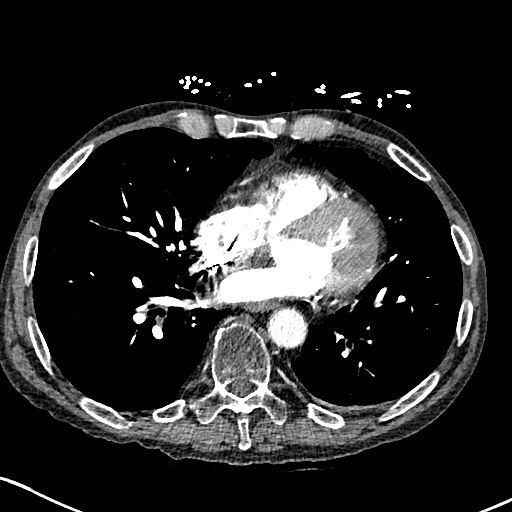
[im 131/350  lung]
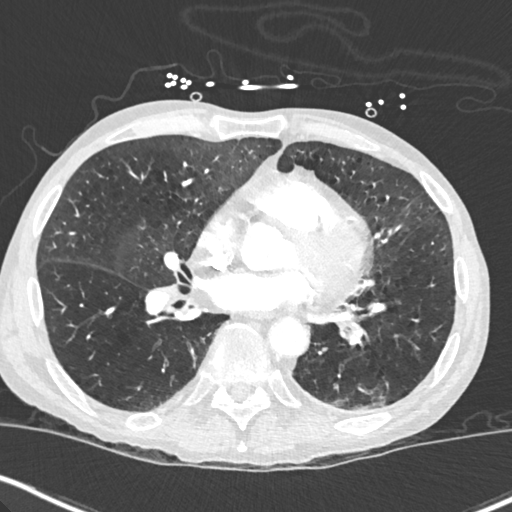
[im 153/350  mediastinal]
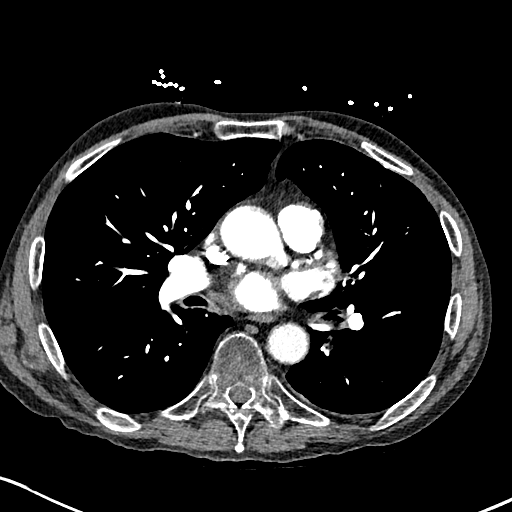
[im 197/350  lung]
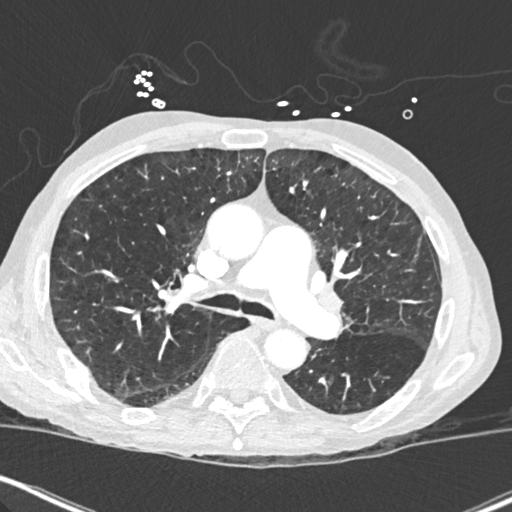
[im 219/350  mediastinal]
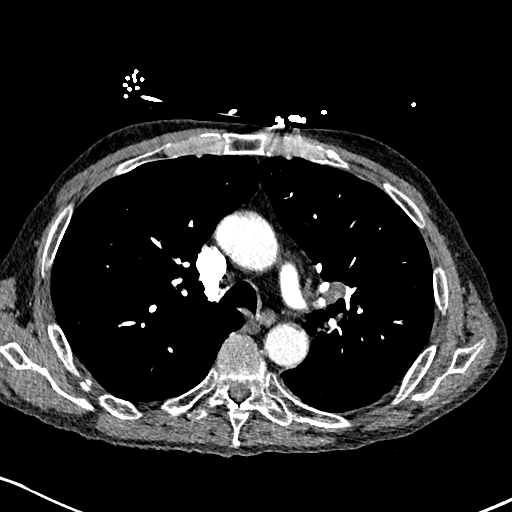
[im 240/350  lung]
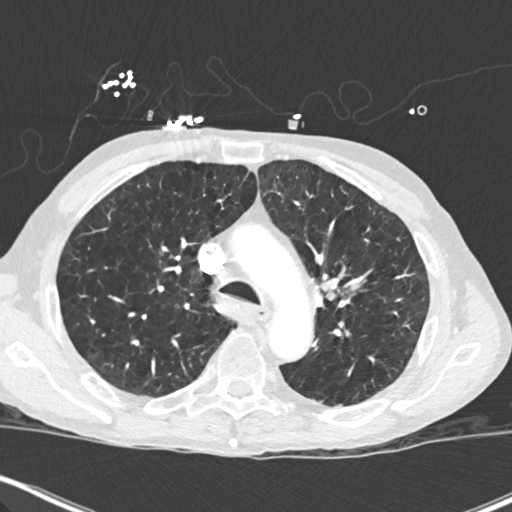
[im 262/350  mediastinal]
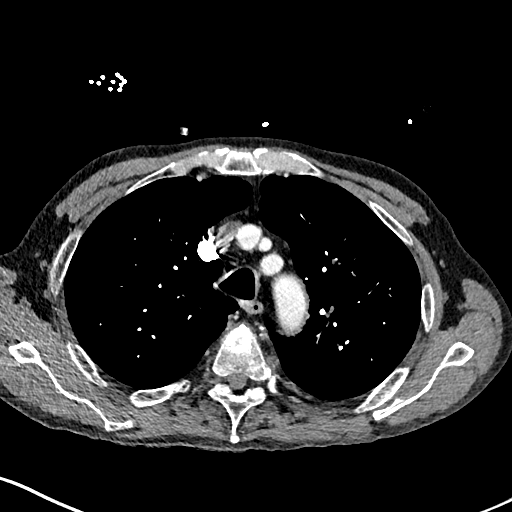
[im 306/350  lung]
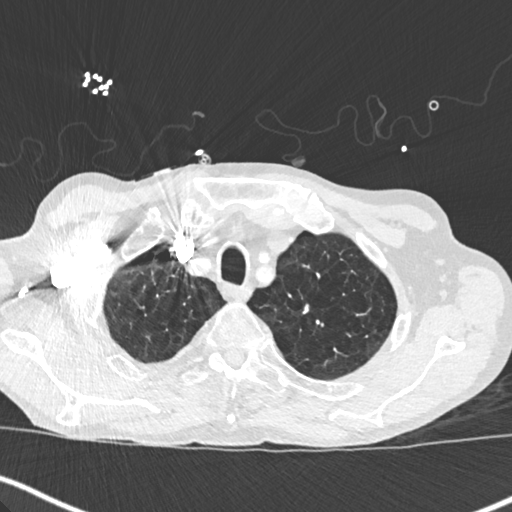
[im 328/350  mediastinal]
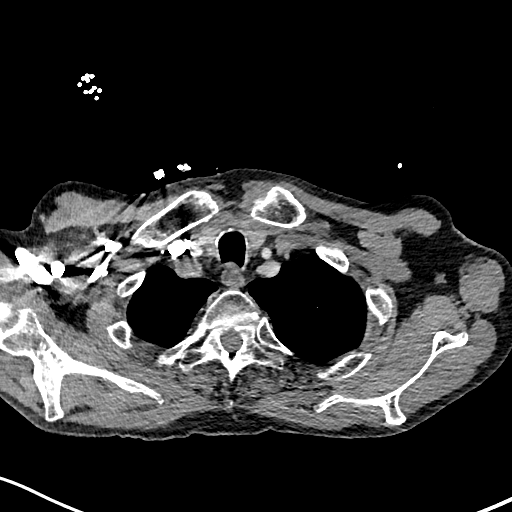

[Series 8: cor soft · coronal · 0.56mm/px · 1 of 117 slices shown]
[im 59/117  mediastinal]
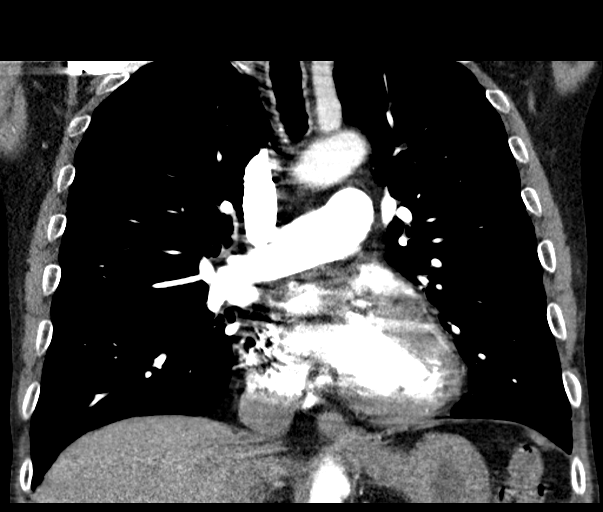

[13 of 36 positions shown; findings below may reference images not displayed]

RADIATION DOSE REDUCTION: This exam was performed according to the
departmental dose-optimization program which includes automated
exposure control, adjustment of the mA and/or kV according to
patient size and/or use of iterative reconstruction technique.

CONTRAST:  100mL OMNIPAQUE IOHEXOL 350 MG/ML SOLN
FINDINGS: Cardiovascular: The cardiac size is normal. There is scattered
calcification in the coronary arteries. There is no pericardial
effusion. The great vessels are patent with normal variant
brachiobicarotid trunk.

The ascending aorta is borderline aneurysmal measuring 3.9 cm. The
remainder is within normal caliber limits. The pulmonary veins are
decompressed.

Prominent but unchanged pulmonary trunk again measuring 3.3 cm
indicating arterial hypertension. The pulmonary arteries are free of
thrombus at least through the segmental arteries with the
subsegmental arterial bed for the most part obscured by breathing
motion.

Mediastinum/Nodes: No enlarged mediastinal, hilar, or axillary lymph
nodes. Thyroid gland, trachea, and esophagus demonstrate no
significant findings.

Lungs/Pleura: Advanced centrilobular/panlobular emphysema
predominating in the upper lobes. Are scattered linear scar-like
opacities.

There is again noted airway obstruction/impaction involving the left
upper lobe apicoposterior segmental main bronchus with mucous
plugging or debris continuing cephalad into several subsegmental
bronchi, as before.

There is increased right lower lobe posterior basal subsegmental
bronchial impaction on series 6 axial 97-107.

Increased posterior basal segmental and subsegmental left lower lobe
bronchial impactions are also noted with increased layering fluid in
the left main bronchus.

There is increased subpleural linear atelectasis in the posterior
basal lower lobes compared to the last CT but no appreciable
infiltrates. There is no pleural effusion, thickening or
pneumothorax.

Upper Abdomen: No acute findings.

Musculoskeletal: Osteopenia. Focal avascular necrosis is noted in
right femoral head medially, as before.

There is a new moderate compression fracture of the T5 vertebral
body with trace posterosuperior cortical retropulsion, 50% anterior
and 40% posterior vertebral height loss, and a mild upper plate
anterior wedge compression fracture of the T8 vertebral body without
retropulsion, with 30% anterior and 25% posterior vertebral height
loss.

Again noted are there mild chronic compression fractures of T10, T11
and L1.

Review of the MIP images confirms the above findings.
IMPRESSION: 1. Negative for visible pulmonary embolism with the subsegmental
arterial bed largely not evaluated to breathing motion.
2. Segmental/subsegmental airway opacification left upper lobe which
was seen previously. Obstructing soft tissue mass is not excluded.
Bronchoscopy recommended.
3. Increased fluid/debris in the lower lobe posterior basal
segmental/subsegmental bronchi. Consider aspiration precautions.
There is increased fluid in the left main bronchus.
4. Increased atelectasis in the posterior lung bases but no visible
infiltrates. Severe COPD change.
5. Compression fractures of the T5 and 8 vertebral bodies new from
[DATE] but acuity indeterminate. Osteopenia and chronic
compression fractures.
6. Borderline aneurysmal ascending aorta. No aortic dissection or
other significant ectasia.
7. Aortic and coronary artery atherosclerosis.

ADDENDUM:
Upon further review, there is a small nonocclusive hypodense acute
thrombus in the posterior right upper lobe segmental main artery on
[DATE], partially occlusive thrombus in an adjacent apically directed
first order segmental artery best seen on series 8 coronal
reconstruction image 63 and 4: 38, with nonocclusive extension of
thrombus a short distance into 2 of its subsegmental branch arteries
on [DATE].

This is a small embolic burden with no findings of associated acute
right heart strain.

*** End of Addendum ***
RADIATION DOSE REDUCTION: This exam was performed according to the
departmental dose-optimization program which includes automated
exposure control, adjustment of the mA and/or kV according to
patient size and/or use of iterative reconstruction technique.

CONTRAST:  100mL OMNIPAQUE IOHEXOL 350 MG/ML SOLN
FINDINGS: Cardiovascular: The cardiac size is normal. There is scattered
calcification in the coronary arteries. There is no pericardial
effusion. The great vessels are patent with normal variant
brachiobicarotid trunk.

The ascending aorta is borderline aneurysmal measuring 3.9 cm. The
remainder is within normal caliber limits. The pulmonary veins are
decompressed.

Prominent but unchanged pulmonary trunk again measuring 3.3 cm
indicating arterial hypertension. The pulmonary arteries are free of
thrombus at least through the segmental arteries with the
subsegmental arterial bed for the most part obscured by breathing
motion.

Mediastinum/Nodes: No enlarged mediastinal, hilar, or axillary lymph
nodes. Thyroid gland, trachea, and esophagus demonstrate no
significant findings.

Lungs/Pleura: Advanced centrilobular/panlobular emphysema
predominating in the upper lobes. Are scattered linear scar-like
opacities.

There is again noted airway obstruction/impaction involving the left
upper lobe apicoposterior segmental main bronchus with mucous
plugging or debris continuing cephalad into several subsegmental
bronchi, as before.

There is increased right lower lobe posterior basal subsegmental
bronchial impaction on series 6 axial 97-107.

Increased posterior basal segmental and subsegmental left lower lobe
bronchial impactions are also noted with increased layering fluid in
the left main bronchus.

There is increased subpleural linear atelectasis in the posterior
basal lower lobes compared to the last CT but no appreciable
infiltrates. There is no pleural effusion, thickening or
pneumothorax.

Upper Abdomen: No acute findings.

Musculoskeletal: Osteopenia. Focal avascular necrosis is noted in
right femoral head medially, as before.

There is a new moderate compression fracture of the T5 vertebral
body with trace posterosuperior cortical retropulsion, 50% anterior
and 40% posterior vertebral height loss, and a mild upper plate
anterior wedge compression fracture of the T8 vertebral body without
retropulsion, with 30% anterior and 25% posterior vertebral height
loss.

Again noted are there mild chronic compression fractures of T10, T11
and L1.

Review of the MIP images confirms the above findings.
IMPRESSION: 1. Negative for visible pulmonary embolism with the subsegmental
arterial bed largely not evaluated to breathing motion.
2. Segmental/subsegmental airway opacification left upper lobe which
was seen previously. Obstructing soft tissue mass is not excluded.
Bronchoscopy recommended.
3. Increased fluid/debris in the lower lobe posterior basal
segmental/subsegmental bronchi. Consider aspiration precautions.
There is increased fluid in the left main bronchus.
4. Increased atelectasis in the posterior lung bases but no visible
infiltrates. Severe COPD change.
5. Compression fractures of the T5 and 8 vertebral bodies new from
[DATE] but acuity indeterminate. Osteopenia and chronic
compression fractures.
6. Borderline aneurysmal ascending aorta. No aortic dissection or
other significant ectasia.
7. Aortic and coronary artery atherosclerosis.

## 2022-03-13 IMAGING — DX DG CHEST 1V PORT
2 series · 2 of 2 positions shown · non-contrast
Comparison: [DATE]

CLINICAL DATA: Chest pain, shortness of breath

EXAM:
PORTABLE CHEST 1 VIEW

[chest ap (1 of 2)]
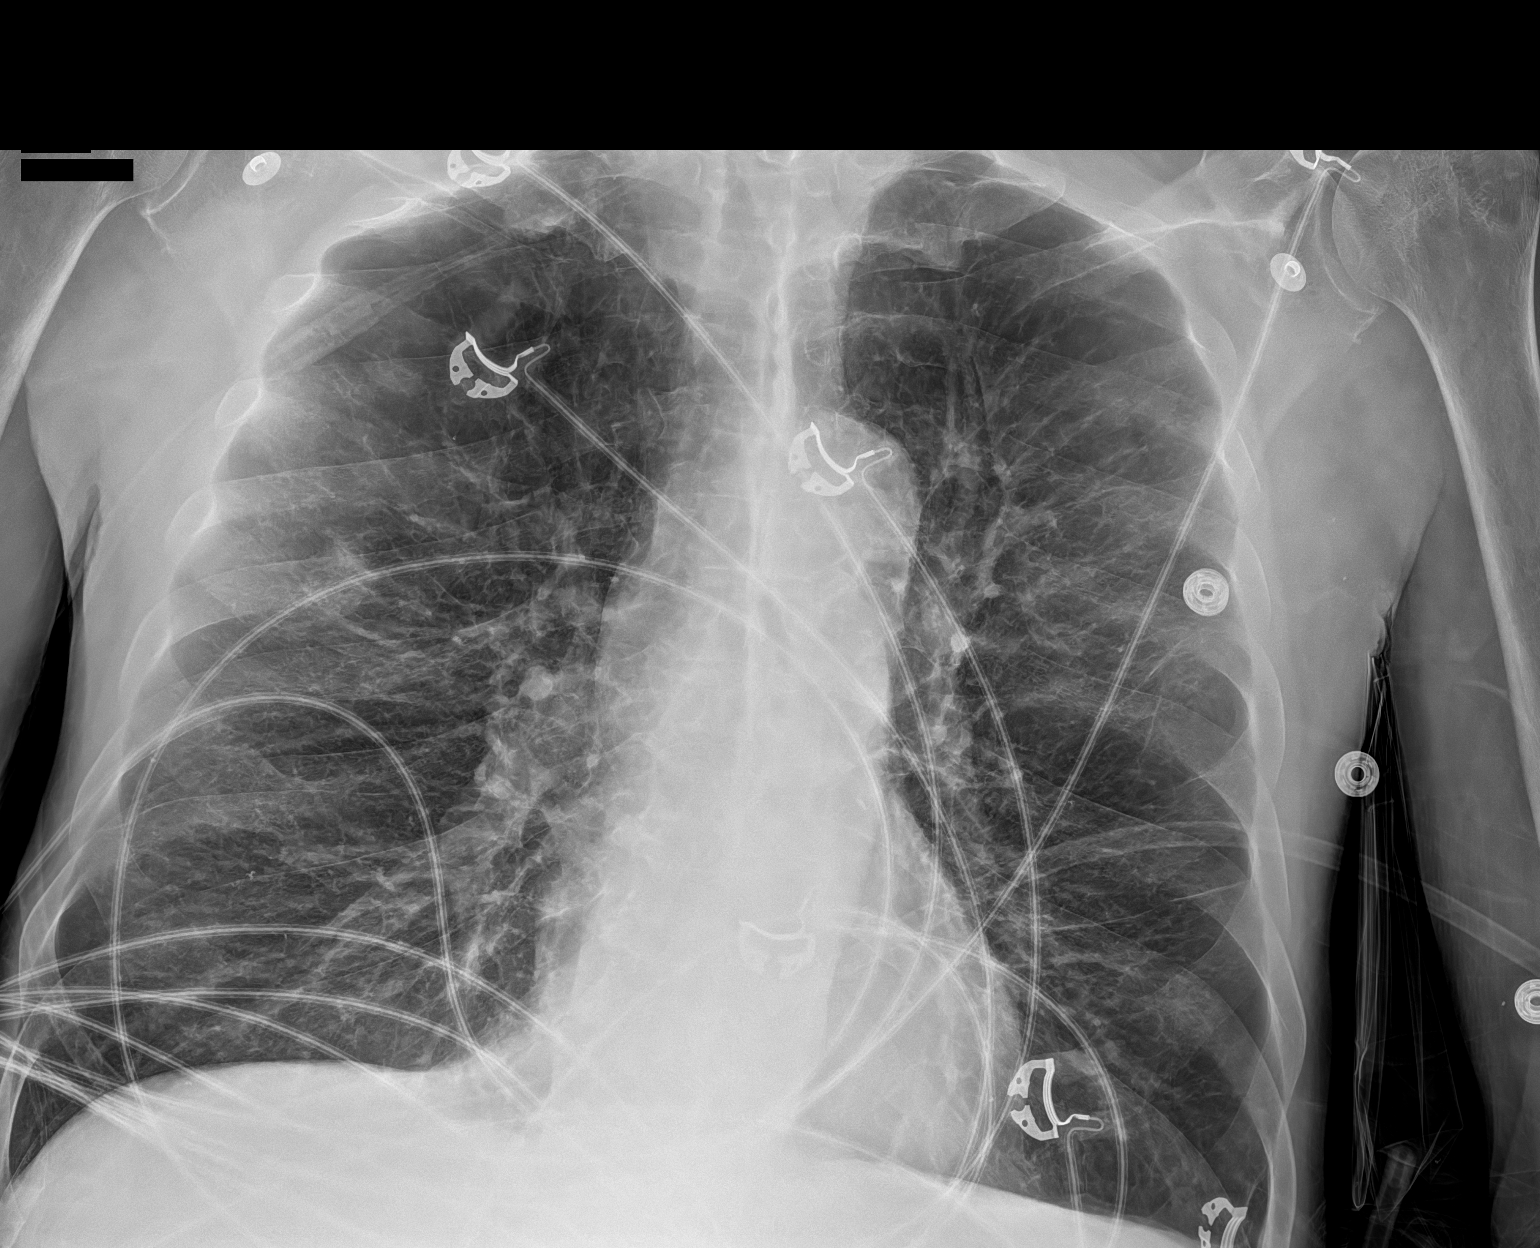

[chest ap (2 of 2)]
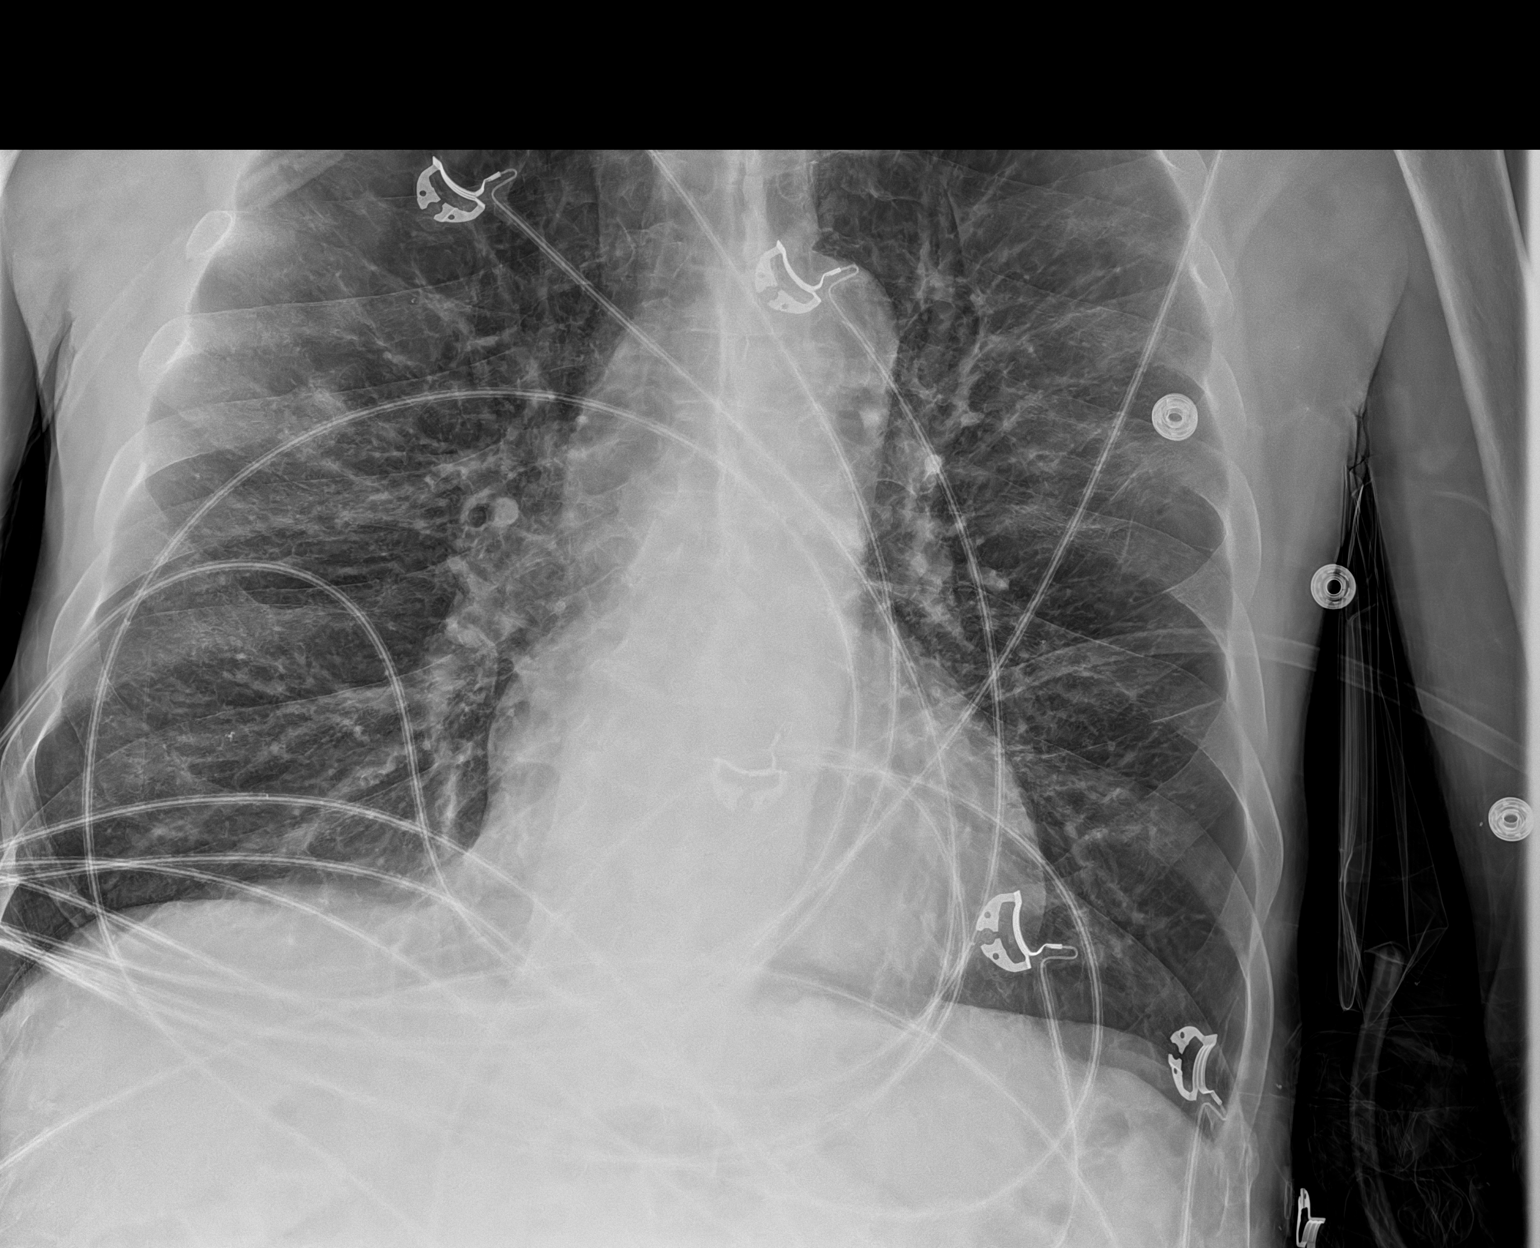

[2 of 2 positions shown; findings below may reference images not displayed]

FINDINGS: Mild patchy/nodular right upper lobe opacities, raising the
possibility of mild pneumonia. Left lung is essentially clear. No
pleural effusion or pneumothorax.

The heart is normal in size.
IMPRESSION: Mild patchy/nodular right upper lobe opacities, raising the
possibility of mild pneumonia.

## 2022-03-13 MED ORDER — SODIUM CHLORIDE 0.9 % IV SOLN: 250.0000 mL | INTRAVENOUS | Status: DC | PRN

## 2022-03-13 MED ORDER — PREDNISONE 20 MG PO TABS
20.0000 mg | ORAL_TABLET | Freq: Every day | ORAL | Status: DC
  Administered 2022-03-13 – 2022-03-15 (×3): 20 mg via ORAL
  Filled 2022-03-13 (×3): qty 1

## 2022-03-13 MED ORDER — SIMETHICONE 80 MG PO CHEW
80.0000 mg | CHEWABLE_TABLET | Freq: Four times a day (QID) | ORAL | Status: DC | PRN
  Administered 2022-03-13 – 2022-03-14 (×2): 80 mg via ORAL
  Filled 2022-03-13 (×2): qty 1

## 2022-03-13 MED ORDER — DM-GUAIFENESIN ER 30-600 MG PO TB12
2.0000 | ORAL_TABLET | Freq: Two times a day (BID) | ORAL | Status: DC
  Administered 2022-03-13 – 2022-03-15 (×5): 2 via ORAL
  Filled 2022-03-13 (×5): qty 2

## 2022-03-13 MED ORDER — ADULT MULTIVITAMIN W/MINERALS CH
1.0000 | ORAL_TABLET | Freq: Every day | ORAL | Status: DC
  Administered 2022-03-13 – 2022-03-15 (×3): 1 via ORAL
  Filled 2022-03-13 (×3): qty 1

## 2022-03-13 MED ORDER — DICLOFENAC SODIUM 1 % EX GEL
2.0000 g | Freq: Four times a day (QID) | CUTANEOUS | Status: DC
  Filled 2022-03-13 (×2): qty 100

## 2022-03-13 MED ORDER — ACETAMINOPHEN 325 MG PO TABS
650.0000 mg | ORAL_TABLET | Freq: Four times a day (QID) | ORAL | Status: DC | PRN
  Administered 2022-03-15: 650 mg via ORAL
  Filled 2022-03-13: qty 2

## 2022-03-13 MED ORDER — BUDESON-GLYCOPYRROL-FORMOTEROL 160-9-4.8 MCG/ACT IN AERO: 2.0000 | INHALATION_SPRAY | Freq: Two times a day (BID) | RESPIRATORY_TRACT | Status: DC

## 2022-03-13 MED ORDER — ATORVASTATIN CALCIUM 40 MG PO TABS
40.0000 mg | ORAL_TABLET | Freq: Every day | ORAL | Status: DC
  Administered 2022-03-13 – 2022-03-15 (×3): 40 mg via ORAL
  Filled 2022-03-13 (×3): qty 1

## 2022-03-13 MED ORDER — UMECLIDINIUM BROMIDE 62.5 MCG/ACT IN AEPB
1.0000 | INHALATION_SPRAY | Freq: Every day | RESPIRATORY_TRACT | Status: DC
  Administered 2022-03-13 – 2022-03-15 (×3): 1 via RESPIRATORY_TRACT
  Filled 2022-03-13: qty 7

## 2022-03-13 MED ORDER — THIAMINE HCL 100 MG/ML IJ SOLN: 100.0000 mg | Freq: Every day | INTRAMUSCULAR | Status: DC

## 2022-03-13 MED ORDER — ENOXAPARIN SODIUM 40 MG/0.4ML IJ SOSY
40.0000 mg | PREFILLED_SYRINGE | Freq: Every day | INTRAMUSCULAR | Status: DC
  Administered 2022-03-13: 40 mg via SUBCUTANEOUS
  Filled 2022-03-13: qty 0.4

## 2022-03-13 MED ORDER — LOXAPINE SUCCINATE 10 MG PO CAPS
10.0000 mg | ORAL_CAPSULE | Freq: Every day | ORAL | Status: DC
  Filled 2022-03-13 (×2): qty 1

## 2022-03-13 MED ORDER — DORZOLAMIDE HCL 2 % OP SOLN
1.0000 [drp] | Freq: Two times a day (BID) | OPHTHALMIC | Status: DC
  Administered 2022-03-13 – 2022-03-15 (×5): 1 [drp] via OPHTHALMIC
  Filled 2022-03-13 (×2): qty 10

## 2022-03-13 MED ORDER — MOMETASONE FURO-FORMOTEROL FUM 100-5 MCG/ACT IN AERO
2.0000 | INHALATION_SPRAY | Freq: Two times a day (BID) | RESPIRATORY_TRACT | Status: DC
  Administered 2022-03-13 – 2022-03-15 (×5): 2 via RESPIRATORY_TRACT
  Filled 2022-03-13: qty 8.8

## 2022-03-13 MED ORDER — LEVOFLOXACIN IN D5W 750 MG/150ML IV SOLN
750.0000 mg | Freq: Once | INTRAVENOUS | Status: AC
  Administered 2022-03-13: 750 mg via INTRAVENOUS
  Filled 2022-03-13: qty 150

## 2022-03-13 MED ORDER — THIAMINE HCL 100 MG PO TABS
100.0000 mg | ORAL_TABLET | Freq: Every day | ORAL | Status: DC
  Administered 2022-03-13 – 2022-03-15 (×3): 100 mg via ORAL
  Filled 2022-03-13 (×3): qty 1

## 2022-03-13 MED ORDER — PANTOPRAZOLE SODIUM 40 MG PO TBEC
40.0000 mg | DELAYED_RELEASE_TABLET | Freq: Every day | ORAL | Status: DC
  Administered 2022-03-13: 40 mg via ORAL
  Filled 2022-03-13: qty 1

## 2022-03-13 MED ORDER — LORAZEPAM 1 MG PO TABS
1.0000 mg | ORAL_TABLET | ORAL | Status: DC | PRN
  Administered 2022-03-13 – 2022-03-15 (×2): 1 mg via ORAL
  Filled 2022-03-13 (×2): qty 1

## 2022-03-13 MED ORDER — FOLIC ACID 1 MG PO TABS
1.0000 mg | ORAL_TABLET | Freq: Every day | ORAL | Status: DC
  Administered 2022-03-13 – 2022-03-15 (×3): 1 mg via ORAL
  Filled 2022-03-13 (×3): qty 1

## 2022-03-13 MED ORDER — POLYETHYLENE GLYCOL 3350 17 G PO PACK
17.0000 g | PACK | Freq: Every day | ORAL | Status: DC | PRN
  Administered 2022-03-13: 17 g via ORAL
  Filled 2022-03-13: qty 1

## 2022-03-13 MED ORDER — LEVOFLOXACIN IN D5W 750 MG/150ML IV SOLN
750.0000 mg | INTRAVENOUS | Status: DC
  Administered 2022-03-14 – 2022-03-15 (×2): 750 mg via INTRAVENOUS
  Filled 2022-03-13 (×2): qty 150

## 2022-03-13 MED ORDER — IPRATROPIUM-ALBUTEROL 0.5-2.5 (3) MG/3ML IN SOLN
3.0000 mL | Freq: Four times a day (QID) | RESPIRATORY_TRACT | Status: DC | PRN
Start: 2022-03-13 — End: 2022-03-15
  Administered 2022-03-15: 3 mL via RESPIRATORY_TRACT
  Filled 2022-03-13: qty 3

## 2022-03-13 MED ORDER — LORATADINE 10 MG PO TABS
10.0000 mg | ORAL_TABLET | Freq: Every day | ORAL | Status: DC
  Administered 2022-03-13 – 2022-03-15 (×3): 10 mg via ORAL
  Filled 2022-03-13 (×3): qty 1

## 2022-03-13 MED ORDER — LOSARTAN POTASSIUM 50 MG PO TABS
100.0000 mg | ORAL_TABLET | Freq: Every day | ORAL | Status: DC
  Administered 2022-03-13 – 2022-03-15 (×3): 100 mg via ORAL
  Filled 2022-03-13: qty 2
  Filled 2022-03-13: qty 4
  Filled 2022-03-13: qty 2

## 2022-03-13 MED ORDER — RISPERIDONE 1 MG PO TABS
2.0000 mg | ORAL_TABLET | Freq: Every day | ORAL | Status: DC
  Filled 2022-03-13: qty 2

## 2022-03-13 MED ORDER — POTASSIUM CHLORIDE CRYS ER 20 MEQ PO TBCR
40.0000 meq | EXTENDED_RELEASE_TABLET | Freq: Two times a day (BID) | ORAL | Status: AC
  Administered 2022-03-13 (×2): 40 meq via ORAL
  Filled 2022-03-13 (×2): qty 2

## 2022-03-13 MED ORDER — ENOXAPARIN SODIUM 60 MG/0.6ML IJ SOSY
1.0000 mg/kg | PREFILLED_SYRINGE | Freq: Two times a day (BID) | INTRAMUSCULAR | Status: DC
  Administered 2022-03-13 – 2022-03-15 (×4): 60 mg via SUBCUTANEOUS
  Filled 2022-03-13 (×4): qty 0.6

## 2022-03-13 MED ORDER — SENNOSIDES-DOCUSATE SODIUM 8.6-50 MG PO TABS
2.0000 | ORAL_TABLET | Freq: Every day | ORAL | Status: DC
  Administered 2022-03-13 – 2022-03-14 (×2): 2 via ORAL
  Filled 2022-03-13 (×2): qty 2

## 2022-03-13 MED ORDER — LOXAPINE SUCCINATE 5 MG PO CAPS
10.0000 mg | ORAL_CAPSULE | Freq: Every day | ORAL | Status: DC
  Filled 2022-03-13 (×5): qty 2

## 2022-03-13 MED ORDER — SODIUM CHLORIDE 0.9% FLUSH
3.0000 mL | Freq: Two times a day (BID) | INTRAVENOUS | Status: DC
  Administered 2022-03-13 – 2022-03-14 (×2): 3 mL via INTRAVENOUS

## 2022-03-13 MED ORDER — IOHEXOL 350 MG/ML SOLN
100.0000 mL | Freq: Once | INTRAVENOUS | Status: AC | PRN
  Administered 2022-03-13: 100 mL via INTRAVENOUS

## 2022-03-13 MED ORDER — HYDROCHLOROTHIAZIDE 12.5 MG PO TABS
12.5000 mg | ORAL_TABLET | Freq: Every day | ORAL | Status: DC
  Administered 2022-03-13 – 2022-03-15 (×3): 12.5 mg via ORAL
  Filled 2022-03-13 (×3): qty 1

## 2022-03-13 MED ORDER — PANTOPRAZOLE SODIUM 40 MG PO TBEC
40.0000 mg | DELAYED_RELEASE_TABLET | Freq: Two times a day (BID) | ORAL | Status: DC
Start: 2022-03-13 — End: 2022-03-15
  Administered 2022-03-13 – 2022-03-15 (×4): 40 mg via ORAL
  Filled 2022-03-13 (×4): qty 1

## 2022-03-13 MED ORDER — GUAIFENESIN 200 MG PO TABS: 400.0000 mg | ORAL_TABLET | Freq: Four times a day (QID) | ORAL | Status: DC | PRN

## 2022-03-13 MED ORDER — GUAIFENESIN-DM 100-10 MG/5ML PO SYRP: 5.0000 mL | ORAL_SOLUTION | ORAL | Status: DC | PRN

## 2022-03-13 MED ORDER — HYDROXYZINE HCL 25 MG PO TABS
25.0000 mg | ORAL_TABLET | Freq: Three times a day (TID) | ORAL | Status: DC | PRN
  Filled 2022-03-13: qty 1

## 2022-03-13 MED ORDER — MORPHINE SULFATE (PF) 4 MG/ML IV SOLN
4.0000 mg | Freq: Once | INTRAVENOUS | Status: AC
  Administered 2022-03-13: 4 mg via INTRAVENOUS
  Filled 2022-03-13: qty 1

## 2022-03-13 MED ORDER — SODIUM CHLORIDE 0.9% FLUSH: 3.0000 mL | INTRAVENOUS | Status: DC | PRN

## 2022-03-13 MED ORDER — LATANOPROST 0.005 % OP SOLN
1.0000 [drp] | Freq: Every day | OPHTHALMIC | Status: DC
  Filled 2022-03-13: qty 2.5

## 2022-03-13 MED ORDER — MIRTAZAPINE 30 MG PO TABS
30.0000 mg | ORAL_TABLET | Freq: Every day | ORAL | Status: DC
  Filled 2022-03-13: qty 1

## 2022-03-13 MED ORDER — BENZTROPINE MESYLATE 1 MG PO TABS
0.5000 mg | ORAL_TABLET | Freq: Every day | ORAL | Status: DC
  Filled 2022-03-13: qty 1

## 2022-03-13 MED ORDER — LORAZEPAM 2 MG/ML IJ SOLN: 1.0000 mg | INTRAMUSCULAR | Status: DC | PRN

## 2022-03-13 NOTE — H&P (Signed)
?History and Physical  ? ? ?Tony Sullivan KDX:[id number] DOB: May 10, 1961 DOA: 03/13/2022 ? ?PCP: [name], FNP  ? ?Patient coming from: Home ? ?Chief Complaint: Chest pain/dyspnea/cough ? ?HPI: Tony Sullivan is a 61 y.o. male with medical history significant for COPD with chronic hypoxemia on 3 L nasal cannula, tobacco abuse, dyslipidemia, hypertension, anxiety, and GERD who presented to the ED with complaints of some chest pain that awoke him from sleep.  This was near the center of his chest with some radiation down his left arm.  He was also having some bilateral lower extremity edema in his feet.  He states that the pain is worsened with deep breaths, but is present at baseline as well.  He has been coughing as per his usual with no significant sputum production.  No fevers or chills noted.  He continues to smoke, but is working on quitting.  He was seen in the ED yesterday with complaints of increased shortness of breath and cough and was noted to have some equipment issues at home with his O2 concentrator nebulizer and this was addressed by [facility] and he was discharged home. ? ?Recent discharge on 5/6 after admission on 5/3 for COPD exacerbation noted.  He was kept on prednisone taper and was given Levaquin at that time. ?  ?ED Course: Stable vital signs noted and patient is noted to have leukocytosis of 15,100.  Potassium 2.9.  Troponins are unremarkable and BNP is low at 48.  CT imaging negative for PE and is concerning for upper lobe opacifications concerning for pneumonia that may be related to aspiration with bronchoscopy recommended.  Chronic T5/8 compression fractures noted.  EKG was sinus rhythm and nonspecific changes.  Patient was given some morphine for pain and started on Levaquin.  He is currently on 6 L nasal cannula. ? ?Review of Systems: Reviewed as noted above, otherwise negative. ? ?Past Medical History:  ?Diagnosis Date  ? Anxiety   ? Arthritis   ? COPD (chronic obstructive pulmonary  disease) ([place]) 2004  ? diagnosed in 2004, no PFT's to date.  Started on home O2 12/2013, after found to be desatting at PCP's office, and referred to pulmonology.  ? Depression   ? GERD (gastroesophageal reflux disease)   ? High blood pressure   ? Prediabetes   ? Seasonal allergies   ? Substance abuse ([place])   ? Tobacco dependence   ? ? ?Past Surgical History:  ?Procedure Laterality Date  ? ANTERIOR CERVICAL DECOMP/DISCECTOMY FUSION N/A 09/06/2019  ? Procedure: Anterior Cervical Discecectomy Fusion - Cerivcal Five-Cervical Six;  Surgeon: Kary Kos, MD;  Location: [facility];  Service: Neurosurgery;  Laterality: N/A;  Anterior Cervical Discecectomy Fusion - Cerivcal Five-Cervical Six  ? ANTERIOR CERVICAL DISCECTOMY  09/06/2019  ? APPENDECTOMY  1980  ? COLONOSCOPY WITH PROPOFOL N/A 12/10/2016  ? Procedure: COLONOSCOPY WITH PROPOFOL;  Surgeon: [name], MD;  Location: [facility];  Service: Endoscopy;  Laterality: N/A;  ? HIP SURGERY Right   ? SHOULDER ARTHROSCOPY Right   ? ? ? reports that he quit smoking about 4 months ago. His smoking use included cigarettes. He has a 9.00 pack-year smoking history. He has never used smokeless tobacco. He reports that he does not currently use alcohol after a past usage of about 14.0 - 21.0 standard drinks per week. He reports current drug use. Drugs: Marijuana and Cocaine. ? ?Allergies  ?Allergen Reactions  ? Penicillins Anaphylaxis  ?  Has patient had a PCN reaction  causing immediate rash, facial/tongue/throat swelling, SOB or lightheadedness with hypotension: Yes ?Has patient had a PCN reaction causing severe rash involving mucus membranes or skin necrosis: No ?Has patient had a PCN reaction that required hospitalization No ?Has patient had a PCN reaction occurring within the last 10 years: No ?If all of the above answers are "NO", then may proceed with Cephalosporin use. ?  ? Levocetirizine Other (See Comments)  ?  Muscle cramps  ? ? ?Family History  ?Problem Relation Age of Onset   ? Emphysema Mother   ? Allergies Mother   ?     "everyone in family"  ? Asthma Mother   ?     "everyone in family"  ? Heart disease Mother   ? Clotting disorder Mother   ? Cancer Mother   ? Emphysema Father   ? Allergies Father   ? Allergies Son   ? Seizures Brother   ? Diabetes Brother   ? ? ?Prior to Admission medications   ?Medication Sig Start Date End Date Taking? Authorizing Provider  ?acetaminophen (TYLENOL) 325 MG tablet Take 2 tablets (650 mg total) by mouth every 6 (six) hours as needed for mild pain (or Fever >/= 101). 10/30/21   [name], MD  ?albuterol (PROVENTIL) (2.5 MG/3ML) 0.083% nebulizer solution Take 3 mLs (2.5 mg total) by nebulization every 4 (four) hours as needed for wheezing or shortness of breath. 12/12/21 12/12/22  [name], MD  ?albuterol (VENTOLIN HFA) 108 (90 Base) MCG/ACT inhaler Inhale 2 puffs into the lungs every 6 (six) hours as needed for wheezing or shortness of breath. 01/09/22   [name], Fleming Prill D, DO  ?atorvastatin (LIPITOR) 40 MG tablet TAKE ONE TABLET BY MOUTH DAILY ?Patient taking differently: Take 40 mg by mouth daily. 07/06/20   [name], MD  ?benztropine (COGENTIN) 0.5 MG tablet Take 0.5 mg by mouth at bedtime.    [provider]  ?Budeson-Glycopyrrol-Formoterol (BREZTRI AEROSPHERE) 160-9-4.8 MCG/ACT AERO Inhale 2 puffs into the lungs in the morning and at bedtime. 12/12/21   [name], MD  ?cetirizine (ZYRTEC) 10 MG tablet Take 10 mg by mouth daily.    [provider]  ?diclofenac Sodium (VOLTAREN) 1 % GEL Apply 2 g topically 4 (four) times daily.    [provider]  ?dorzolamide (TRUSOPT) 2 % ophthalmic solution Place 1 drop into the left eye 2 (two) times daily. 12/12/20   [provider]  ?guaiFENesin 200 MG tablet Take 2 tablets (400 mg total) by mouth every 6 (six) hours as needed for cough or to loosen phlegm. 03/02/22   Orson Eva, MD  ?hydrochlorothiazide (HYDRODIURIL) 25 MG tablet Take 0.5 tablets (12.5 mg total)  by mouth daily. 10/30/21   [name], MD  ?hydrOXYzine (ATARAX) 25 MG tablet Take 1 tablet (25 mg total) by mouth 3 (three) times daily as needed for anxiety or nausea. 10/30/21   [name], MD  ?latanoprost (XALATAN) 0.005 % ophthalmic solution Place 1 drop into both eyes at bedtime. 05/24/19   [provider]  ?losartan (COZAAR) 100 MG tablet Take 100 mg by mouth daily.    [provider]  ?loxapine (LOXITANE) 10 MG capsule Take 10 mg by mouth at bedtime.     [provider]  ?mirtazapine (REMERON) 30 MG tablet Take 1 tablet (30 mg total) by mouth at bedtime. 10/30/21   [name], MD  ?Multiple Vitamin (MULTIVITAMIN WITH MINERALS) TABS tablet Take 1 tablet by mouth daily. 10/31/21  [name], MD  ?OXYGEN Inhale 3-4 L into the lungs as directed. 3-4 liters with sleep and exertion as needed for low oxygen    [provider]  ?pantoprazole (PROTONIX) 40 MG tablet Take 1 tablet (40 mg total) by mouth daily. 10/30/21   [name], MD  ?polyethylene glycol (MIRALAX / GLYCOLAX) 17 g packet TAKE 17 GRAMS BY MOUTH DAILY ?Patient taking differently: Take 17 g by mouth daily as needed for mild constipation. 03/09/20   [name], MD  ?predniSONE (DELTASONE) 20 MG tablet Take 1 tablet (20 mg total) by mouth daily. 03/11/22   [name], MD  ?risperiDONE (RISPERDAL) 2 MG tablet Take 1 tablet (2 mg total) by mouth at bedtime. 10/30/21   [name], MD  ?senna-docusate (SENOKOT-S) 8.6-50 MG tablet Take 2 tablets by mouth at bedtime. 10/30/21   [name], MD  ? ? ?Physical Exam: ?Vitals:  ? 03/13/22 0650 03/13/22 0655 03/13/22 0700 03/13/22 0709  ?BP:    (!) 137/92  ?Pulse: (!) 59 64 60 67  ?Resp: 12 11 12 14   ?Temp:      ?TempSrc:      ?SpO2: 100% 100% 100% 100%  ?Weight:      ?Height:      ? ? ?Constitutional: NAD, calm, comfortable ?Vitals:  ? 03/13/22 0650 03/13/22 0655 03/13/22 0700 03/13/22 0709  ?BP:    (!) 137/92  ?Pulse: (!) 59 64 60 67  ?Resp: 12  11 12 14   ?Temp:      ?TempSrc:      ?SpO2: 100% 100% 100% 100%  ?Weight:      ?Height:      ? ?Eyes: lids and conjunctivae normal ?Neck: normal, supple ?Respiratory: clear to auscultation bilaterally

## 2022-03-13 NOTE — ED Notes (Signed)
Patient transported to CT 

## 2022-03-13 NOTE — Progress Notes (Signed)
ANTICOAGULATION CONSULT NOTE - Initial Consult ? ?Pharmacy Consult for Lovenox dosing. ?Indication: pulmonary embolus ? ?Allergies  ?Allergen Reactions  ? Penicillins Anaphylaxis  ?  Has patient had a PCN reaction causing immediate rash, facial/tongue/throat swelling, SOB or lightheadedness with hypotension: Yes ?Has patient had a PCN reaction causing severe rash involving mucus membranes or skin necrosis: No ?Has patient had a PCN reaction that required hospitalization No ?Has patient had a PCN reaction occurring within the last 10 years: No ?If all of the above answers are "NO", then may proceed with Cephalosporin use. ?  ? Levocetirizine Other (See Comments)  ?  Muscle cramps  ? ? ?Patient Measurements: ?Height: 5\' 11"  (180.3 cm) ?Weight: 61.2 kg (134 lb 14.7 oz) ?IBW/kg (Calculated) : 75.3 ?Lovenox Dosing Weight: 61.2 mg ? ?Vital Signs: ?Temp: 98.1 ?F (36.7 ?C) (05/17 1646) ?Temp Source: Oral (05/17 1646) ?BP: 118/72 (05/17 1754) ?Pulse Rate: 73 (05/17 1754) ? ?Labs: ?Recent Labs  ?  03/13/22 ?0312 03/13/22 ?0505  ?HGB 13.3  --   ?HCT 42.2  --   ?PLT 218  --   ?CREATININE 0.83  --   ?TROPONINIHS 19* 16  ? ? ?Estimated Creatinine Clearance: 84 mL/min (by C-G formula based on SCr of 0.83 mg/dL). ? ? ?Medical History: ?Past Medical History:  ?Diagnosis Date  ? Anxiety   ? Arthritis   ? COPD (chronic obstructive pulmonary disease) ([place]) 2004  ? diagnosed in 2004, no PFT's to date.  Started on home O2 12/2013, after found to be desatting at PCP's office, and referred to pulmonology.  ? Depression   ? GERD (gastroesophageal reflux disease)   ? High blood pressure   ? Prediabetes   ? Seasonal allergies   ? Substance abuse ([place])   ? Tobacco dependence   ? ? ?Medications:  ?Scheduled:  ? atorvastatin  40 mg Oral Daily  ? benztropine  0.5 mg Oral QHS  ? dextromethorphan-guaiFENesin  2 tablet Oral BID  ? diclofenac Sodium  2 g Topical QID  ? dorzolamide  1 drop Left Eye BID  ? enoxaparin (LOVENOX) injection  1 mg/kg  Subcutaneous M38G  ? folic acid  1 mg Oral Daily  ? hydrochlorothiazide  12.5 mg Oral Daily  ? latanoprost  1 drop Both Eyes QHS  ? loratadine  10 mg Oral Daily  ? losartan  100 mg Oral Daily  ? loxapine  10 mg Oral QHS  ? mirtazapine  30 mg Oral QHS  ? mometasone-formoterol  2 puff Inhalation BID  ? multivitamin with minerals  1 tablet Oral Daily  ? pantoprazole  40 mg Oral BID AC  ? potassium chloride  40 mEq Oral BID  ? predniSONE  20 mg Oral Daily  ? risperiDONE  2 mg Oral QHS  ? senna-docusate  2 tablet Oral QHS  ? sodium chloride flush  3 mL Intravenous Q12H  ? thiamine  100 mg Oral Daily  ? Or  ? thiamine  100 mg Intravenous Daily  ? umeclidinium bromide  1 puff Inhalation Daily  ? ? ?Assessment: ?Tony Sullivan is a 61 y.o. male with medical history significant for COPD with chronic hypoxemia on 3 L nasal cannula, tobacco abuse, dyslipidemia, hypertension, anxiety, and GERD who presented to the ED 5/17 with complaints of some chest pain. He received a prophylactic dose of Lovenox today at 10:22 in the ED.  ? ?Goal of Therapy:  ?Anti-Xa goal 0.6-1 units/ml ?Monitor CBC,  s/s bleeding and PE resolution. ?Monitor platelets by  anticoagulation protocol: Yes ?  ?Plan:  ?Lovenox 60 mg subQ q12 hr. ? ?Blenda Nicely ?03/13/2022,7:00 PM ? ? ?

## 2022-03-13 NOTE — Progress Notes (Signed)
Received report from [employer]. Pt resting. Nad. Pt stated wanted med for constipation, stated last bm 4 days ago. Abd soft. Also requested med for anxiety. Pt calm, no visible ss of anxiety noted. Pt medicated by Casey Burkitt. Nad. No other needs at this time.  ?

## 2022-03-13 NOTE — ED Triage Notes (Signed)
Ems gave pt one nitro tab and 324mg  of aspirin.  ?

## 2022-03-13 NOTE — Consult Note (Signed)
? ?NAME:  Tony Sullivan, MRN:  [id number], DOB:  [birth year], LOS: 0 ?ADMISSION DATE:  03/13/2022, CONSULTATION DATE:  5/17 ?REFERRING MD:  Shah/[facility], CHIEF COMPLAINT:  sob  ? ?History of Present Illness:  ?29 yobm ? Still actively smoking with breathing difficulties since 2004  with GOLD2 copd criteria established 02/14/14 and GOLD 3 02/2018  who has become gradually more debilitated and frail and  both 02 and pred dependent under my care but not previously hypercarbic. ? ?Presented to the ED AM 5/17  with complaints of some chest pain that awoke him from sleep.  This was near the center of his chest with some radiation down his left arm.  He was also having some bilateral lower extremity edema.  He states that the pain is worsened with deep breaths, but is present at baseline as well.  He has been coughing as per his usual with no significant sputum production.  No fevers or chills noted.   He was seen in the ED  one day PTA with complaints of increased shortness of breath and cough and was noted to have some equipment issues at home with his O2 concentrator nebulizer and this was addressed by [facility] and he was discharged home. ?  ?Note discharge on 5/6 after admission on 5/3 for COPD exacerbation noted.  He was kept on prednisone taper and was given Levaquin at that time. ?  ? ? ?  ? ?Significant Hospital Events: ?Including procedures, antibiotic start and stop dates in addition to other pertinent events   ?CTa 5/17 1.  No PE .Segmental/subsegmental airway opacification left upper lobe which ?was seen previously. Obstructing soft tissue mass is not excluded. Increased fluid/debris in the lower lobe posterior basal segmental/subsegmental bronchi. Consider aspiration precautions.There is increased fluid in the left main bronchus. Increased atelectasis in the posterior lung bases but no visible infiltrates. Severe COPD change.   Compression fractures of the T5 and 8 vertebral bodies new   ?Osteopenia and chronic  compression fractures. ? ?Scheduled Meds: ? atorvastatin  40 mg Oral Daily  ? benztropine  0.5 mg Oral QHS  ? Budeson-Glycopyrrol-Formoterol  2 puff Inhalation BID  ? diclofenac Sodium  2 g Topical QID  ? dorzolamide  1 drop Left Eye BID  ? enoxaparin (LOVENOX) injection  40 mg Subcutaneous Q24H  ? hydrochlorothiazide  12.5 mg Oral Daily  ? latanoprost  1 drop Both Eyes QHS  ? loratadine  10 mg Oral Daily  ? losartan  100 mg Oral Daily  ? loxapine  10 mg Oral QHS  ? mirtazapine  30 mg Oral QHS  ? pantoprazole  40 mg Oral Daily  ? potassium chloride  40 mEq Oral BID  ? predniSONE  20 mg Oral Daily  ? risperiDONE  2 mg Oral QHS  ? senna-docusate  2 tablet Oral QHS  ? sodium chloride flush  3 mL Intravenous Q12H  ? ?Continuous Infusions: ? sodium chloride    ? [START ON 03/14/2022] levofloxacin (LEVAQUIN) IV    ? ?PRN Meds:.sodium chloride, acetaminophen, guaiFENesin, guaiFENesin-dextromethorphan, hydrOXYzine, ipratropium-albuterol, polyethylene glycol, sodium chloride flush  ? ? ? ?Interim History / Subjective:  ?Comfortable at rest - no ongoing cp / not coughing much up = min mucoid ?Has vest at home not using / denies choking on food.  ?Says still taking pred 20 mg bid (this was not the last instruction I gave him in writing)  ? ?Objective   ?Blood pressure (!) 137/92, pulse 67, temperature 97.9 ?F (36.6 ?  C), temperature source Oral, resp. rate 14, height 5\' 11"  (1.803 m), weight 65.8 kg, SpO2 100 %. ?   ?   ? ?Intake/Output Summary (Last 24 hours) at 03/13/2022 0943 ?Last data filed at 03/13/2022 0803 ?Gross per 24 hour  ?Intake 146.24 ml  ?Output --  ?Net 146.24 ml  ? ?Filed Weights  ? 03/13/22 0236  ?Weight: 65.8 kg  ? ? ?Examination: ?Tmax:  97.9 ?General appearance:    e;derly bm easily confused with details of care   ?At Rest 02 sats  100% on 4lpm   ?No jvd ?Oropharynx clear,  mucosa nl ?Neck supple ?Lungs with a few scattered exp > insp rhonchi bilaterally and very distant bs  ?RRR no s3 or or sign murmur ?Abd  soft/ no tenderness   ?Extr warm with  trace bilateral LE   ?Neuro  Sensorium slowed mentation,  no apparent motor deficits  ? ?Resolved Hospital Problem list   ?  ? ?Assessment & Plan:  ?1) acute on chronic resp failure/ hypoxemia with ? New Hypercarbic resp failure as well ?>>> comfortable at rest so just rec keep sats around 90% by titration/ no bipap for now esp with issue of ? Aspiration ? ?2) CT findings suspicious for aspiration  ?>>> MBS at some point this admit ?>>> could benefit from VEST either his from home or order one here ?>>> levaquin adequate for now ? ? ?3) GOLD 3 copd difficult to manage ?DDX of  difficult airways management almost all start with A and  include Adherence, Ace Inhibitors, Acid Reflux, Active Sinus Disease, Alpha 1 Antitripsin deficiency, Anxiety masquerading as Airways dz,  ABPA,  Allergy(esp in young), Aspiration (esp in elderly), Adverse effects of meds,  Active smoking or vaping, A bunch of PE's (a small clot burden can't cause this syndrome unless there is already severe underlying pulm or vascular dz with poor reserve) plus two Bs  = Bronchiectasis and Beta blocker use..and one C= CHF ? ?Adherence is always the initial "prime suspect" and is a multilayered concern that requires a "trust but verify" approach in every patient - starting with knowing how to use medications, especially inhalers, correctly, keeping up with refills and understanding the fundamental difference between maintenance and prns vs those medications only taken for a very short course and then stopped and not refilled.  ?>>> return to clinic with all meds in hand using a trust but verify approach to confirm accurate Medication  Reconciliation The principal here is that until we are certain that the  patients are doing what we've asked, it makes no sense to ask them to do more.  ? ?Active smoking > variably denies > check uring cotinine  ? ?? Anxiety /depression> usually at the bottom of this list of usual  suspects but should be much higher on this pt's based on H and P and note already on psychotropics and may interfere with adherence and also interpretation of response or lack thereof to symptom management which can be quite subjective.  ? ?? Adverse/ surreptitious drug effects > check UDS ? ?? Allergy /asthma component > doubt rec minimize steroids systemically ? ?? Aspiration > MBS this admit  ? ?? Bronchiectasis > needs VEST either his of the hospitals ?>>> no role for FOB at this point.   ? ?? CHF > bnp nl / echo 5/4 grade 1 diastolic dysfunction with nl LA and no evidence PH ? ?Best Practice (right click and "Reselect all SmartList Selections" daily)  ? ?  Per [facility]  ? ?Labs   ?CBC: ?Recent Labs  ?Lab 03/06/22 ?1341 03/13/22 ?0312  ?WBC 15.5* 15.1*  ?HGB 14.3 13.3  ?HCT 47.2 42.2  ?MCV 103.3* 101.2*  ?PLT 170 218  ? ? ?Basic Metabolic Panel: ?Recent Labs  ?Lab 03/06/22 ?1341 03/13/22 ?0312  ?NA 141 142  ?K 3.7 2.9*  ?CL 101 98  ?CO2 26 36*  ?GLUCOSE 81 96  ?BUN 21* 17  ?CREATININE 0.85 0.83  ?CALCIUM 9.0 8.6*  ? ?GFR: ?Estimated Creatinine Clearance: 90.3 mL/min (by C-G formula based on SCr of 0.83 mg/dL). ?Recent Labs  ?Lab 03/06/22 ?1341 03/13/22 ?0312  ?WBC 15.5* 15.1*  ? ? ?Liver Function Tests: ?No results for input(s): AST, ALT, ALKPHOS, BILITOT, PROT, ALBUMIN in the last 168 hours. ?No results for input(s): LIPASE, AMYLASE in the last 168 hours. ?No results for input(s): AMMONIA in the last 168 hours. ? ?ABG ?   ?Component Value Date/Time  ? PHART 7.317 (L) 10/27/2021 0400  ? PCO2ART 69.7 (HH) 10/27/2021 0400  ? PO2ART 139 (H) 10/27/2021 0400  ? HCO3 30.1 (H) 10/27/2021 0400  ? TCO2 30 05/05/2021 0244  ? ACIDBASEDEF 2.0 09/30/2018 0939  ? O2SAT 97.9 10/27/2021 0400  ?  ? ?Coagulation Profile: ?No results for input(s): INR, PROTIME in the last 168 hours. ? ?Cardiac Enzymes: ?No results for input(s): CKTOTAL, CKMB, CKMBINDEX, TROPONINI in the last 168 hours. ? ?HbA1C: ?Hgb A1c MFr Bld  ?Date/Time Value Ref  Range Status  ?05/05/2021 02:29 AM 6.1 (H) 4.8 - 5.6 % Final  ?  Comment:  ?  (NOTE) ?Pre diabetes:          5.7%-6.4% ? ?Diabetes:              >6.4% ? ?Glycemic control for   <7.0% ?adults with diabetes ?  ? ? ?C

## 2022-03-13 NOTE — ED Triage Notes (Signed)
Pt c/o chest tightness and sob with leg swelling. Pt c/o generalized weakness. Pt was seen for the same yesterday.  ?

## 2022-03-13 NOTE — ED Provider Notes (Signed)
?[facility] DEPT ?[facility] ?Provider Note ?MRN:  [id number]  ?Arrival date & time: 03/13/22    ? ?Chief Complaint   ?Chest Pain ?  ?History of Present Illness   ?Tony Sullivan is a 61 y.o. year-old male with a history of COPD, high blood pressure presenting to the ED with chief complaint of chest pain. ? ?Chest pain waking him from sleep, center of the chest, also endorsing paresthesia down the left arm.  Endorsing bilateral leg swelling which is new for him.  Pain is worse with deep breaths. ? ?Review of Systems  ?A thorough review of systems was obtained and all systems are negative except as noted in the HPI and PMH.  ? ?Patient's Health History   ? ?Past Medical History:  ?Diagnosis Date  ? Anxiety   ? Arthritis   ? COPD (chronic obstructive pulmonary disease) ([place]) 2004  ? diagnosed in 2004, no PFT's to date.  Started on home O2 12/2013, after found to be desatting at PCP's office, and referred to pulmonology.  ? Depression   ? GERD (gastroesophageal reflux disease)   ? High blood pressure   ? Prediabetes   ? Seasonal allergies   ? Substance abuse ([place])   ? Tobacco dependence   ?  ?Past Surgical History:  ?Procedure Laterality Date  ? ANTERIOR CERVICAL DECOMP/DISCECTOMY FUSION N/A 09/06/2019  ? Procedure: Anterior Cervical Discecectomy Fusion - Cerivcal Five-Cervical Six;  Surgeon: Kary Kos, MD;  Location: [facility];  Service: Neurosurgery;  Laterality: N/A;  Anterior Cervical Discecectomy Fusion - Cerivcal Five-Cervical Six  ? ANTERIOR CERVICAL DISCECTOMY  09/06/2019  ? APPENDECTOMY  1980  ? COLONOSCOPY WITH PROPOFOL N/A 12/10/2016  ? Procedure: COLONOSCOPY WITH PROPOFOL;  Surgeon: [name], MD;  Location: [facility];  Service: Endoscopy;  Laterality: N/A;  ? HIP SURGERY Right   ? SHOULDER ARTHROSCOPY Right   ?  ?Family History  ?Problem Relation Age of Onset  ? Emphysema Mother   ? Allergies Mother   ?     "everyone in family"  ? Asthma Mother   ?     "everyone in family"  ?  Heart disease Mother   ? Clotting disorder Mother   ? Cancer Mother   ? Emphysema Father   ? Allergies Father   ? Allergies Son   ? Seizures Brother   ? Diabetes Brother   ?  ?Social History  ? ?Socioeconomic History  ? Marital status: Single  ?  Spouse name: Not on file  ? Number of children: 6  ? Years of education: Not on file  ? Highest education level: Not on file  ?Occupational History  ? Occupation: disabled  ?Tobacco Use  ? Smoking status: Former  ?  Packs/day: 0.25  ?  Years: 36.00  ?  Pack years: 9.00  ?  Types: Cigarettes  ?  Quit date: 10/2021  ?  Years since quitting: 0.3  ? Smokeless tobacco: Never  ?Vaping Use  ? Vaping Use: Never used  ?Substance and Sexual Activity  ? Alcohol use: Not Currently  ?  Alcohol/week: 14.0 - 21.0 standard drinks  ?  Types: 14 - 21 Standard drinks or equivalent per week  ?  Comment: 2-3 beers/day, with occasional "binges".  No history of withdrawal  ? Drug use: Yes  ?  Types: Marijuana, Cocaine  ?  Comment: last cocaine February 2023  ? Sexual activity: Not on file  ?Other Topics Concern  ? Not on file  ?  Social History Narrative  ? Not on file  ? ?Social Determinants of Health  ? ?Financial Resource Strain: Not on file  ?Food Insecurity: Not on file  ?Transportation Needs: Not on file  ?Physical Activity: Not on file  ?Stress: Not on file  ?Social Connections: Not on file  ?Intimate Partner Violence: Not on file  ?  ? ?Physical Exam  ? ?Vitals:  ? 03/13/22 0620 03/13/22 0630  ?BP:  122/78  ?Pulse: 66 63  ?Resp: 13 14  ?Temp:    ?SpO2: 100% 100%  ?  ?CONSTITUTIONAL: Chronically ill-appearing, NAD ?NEURO/PSYCH:  Alert and oriented x 3, no focal deficits ?EYES:  eyes equal and reactive ?ENT/NECK:  no LAD, no JVD ?CARDIO: Regular rate, well-perfused, normal S1 and S2 ?PULM:  CTAB no wheezing or rhonchi ?GI/GU:  non-distended, non-tender ?MSK/SPINE:  No gross deformities, no edema ?SKIN:  no rash, atraumatic ? ? ?*Additional and/or pertinent findings included in MDM  below ? ?Diagnostic and Interventional Summary  ? ? EKG Interpretation ? ?Date/Time:  Wednesday Mar 13 2022 02:41:20 EDT ?Ventricular Rate:  92 ?PR Interval:  153 ?QRS Duration: 88 ?QT Interval:  372 ?QTC Calculation: 451 ?R Axis:   -20 ?Text Interpretation: Sinus rhythm Ventricular premature complex Probable left atrial enlargement Left anterior fascicular block Anterior infarct, old Minimal ST depression, inferior leads Confirmed by Gerlene Fee [phone]) on 03/13/2022 2:56:40 AM ?  ? ?  ? ?Labs Reviewed  ?CBC - Abnormal; Notable for the following components:  ?    Result Value  ? WBC 15.1 (*)   ? RBC 4.17 (*)   ? MCV 101.2 (*)   ? All other components within normal limits  ?BASIC METABOLIC PANEL - Abnormal; Notable for the following components:  ? Potassium 2.9 (*)   ? CO2 36 (*)   ? Calcium 8.6 (*)   ? All other components within normal limits  ?TROPONIN I (HIGH SENSITIVITY) - Abnormal; Notable for the following components:  ? Troponin I (High Sensitivity) 19 (*)   ? All other components within normal limits  ?BRAIN NATRIURETIC PEPTIDE  ?TROPONIN I (HIGH SENSITIVITY)  ?  ?CT Angio Chest Pulmonary Embolism (PE) W or WO Contrast  ?Final Result  ?  ?DG Chest Port 1 View  ?Final Result  ?  ?  ?Medications  ?levofloxacin (LEVAQUIN) IVPB 750 mg (750 mg Intravenous New Bag/Given 03/13/22 4854)  ?morphine (PF) 4 MG/ML injection 4 mg (4 mg Intravenous Given 03/13/22 0311)  ?iohexol (OMNIPAQUE) 350 MG/ML injection 100 mL (100 mLs Intravenous Contrast Given 03/13/22 0401)  ?  ? ?Procedures  /  Critical Care ?Procedures ? ?ED Course and Medical Decision Making  ?Initial Impression and Ddx ?Chest pain and shortness of breath, a pleuritic component to the pain but also pain on the left arm.  And so ACS is considered, PE is considered.  No significant wheezing on exam, COPD of course considered given his history but felt to be less likely.  Pneumonia possibility, new heart failure as well.  Awaiting labs, chest x-ray, will need  CTA. ? ?Past medical/surgical history that increases complexity of ED encounter: COPD ? ?Interpretation of Diagnostics ?I personally reviewed the EKG and my interpretation is as follows: Sinus rhythm ?   ?Labs reveal mild increase in troponin, downtrending upon repeat.  CTA is without PE, there is evidence of persistent lung opacification, question pneumonia versus mass. ? ?Patient Reassessment and Ultimate Disposition/Management ?Requesting hospitalist admission. ? ?Patient management required discussion with the following services or  consulting groups:  Hospitalist Service ? ?Complexity of Problems Addressed ?Acute illness or injury that poses threat of life of bodily function ? ?Additional Data Reviewed and Analyzed ?Further history obtained from: ?Recent discharge summary ? ?Additional Factors Impacting ED Encounter Risk ?Consideration of hospitalization ? ?Barth Kirks. Sedonia Small, MD ?[facility] ?[place] [email or web address] ? ?Final Clinical Impressions(s) / ED Diagnoses  ? ?  ICD-10-CM   ?1. Chest pain, unspecified type  R07.9   ?  ?  ?ED Discharge Orders   ? ? None  ? ?  ?  ? ?Discharge Instructions Discussed with and Provided to Patient:  ? ?Discharge Instructions   ?None ?  ? ?  ?Maudie Flakes, MD ?03/13/22 [phone] ? ?

## 2022-03-14 ENCOUNTER — Inpatient Hospital Stay (HOSPITAL_COMMUNITY): Payer: Medicaid Other

## 2022-03-14 DIAGNOSIS — J189 Pneumonia, unspecified organism: Secondary | ICD-10-CM

## 2022-03-14 LAB — CBC
HCT: 39.3 % (ref 39.0–52.0)
Hemoglobin: 11.9 g/dL — ABNORMAL LOW (ref 13.0–17.0)
MCH: 30.6 pg (ref 26.0–34.0)
MCHC: 30.3 g/dL (ref 30.0–36.0)
MCV: 101 fL — ABNORMAL HIGH (ref 80.0–100.0)
Platelets: 202 10*3/uL (ref 150–400)
RBC: 3.89 MIL/uL — ABNORMAL LOW (ref 4.22–5.81)
RDW: 14.2 % (ref 11.5–15.5)
WBC: 12.4 10*3/uL — ABNORMAL HIGH (ref 4.0–10.5)
nRBC: 0 % (ref 0.0–0.2)

## 2022-03-14 LAB — BASIC METABOLIC PANEL
Anion gap: 7 (ref 5–15)
BUN: 17 mg/dL (ref 6–20)
CO2: 34 mmol/L — ABNORMAL HIGH (ref 22–32)
Calcium: 8.4 mg/dL — ABNORMAL LOW (ref 8.9–10.3)
Chloride: 99 mmol/L (ref 98–111)
Creatinine, Ser: 0.88 mg/dL (ref 0.61–1.24)
GFR, Estimated: >60 mL/min (ref >60)
Glucose, Bld: 126 mg/dL — ABNORMAL HIGH (ref 70–99)
Potassium: 3.5 mmol/L (ref 3.5–5.1)
Sodium: 140 mmol/L (ref 135–145)

## 2022-03-14 LAB — MAGNESIUM: Magnesium: 2.3 mg/dL (ref 1.7–2.4)

## 2022-03-14 LAB — TSH: TSH: 1.353 u[IU]/mL (ref 0.350–4.500)

## 2022-03-14 IMAGING — US US EXTREM LOW VENOUS
1 series · 13 of 24 positions shown · non-contrast
Comparison: None Available.

CLINICAL DATA: Bilateral lower extremity pain for the past 5 years



[Series 1: us venous img lower bilat (dvt) · portal-venous · 13 of 71 slices shown]
[im 1/71]
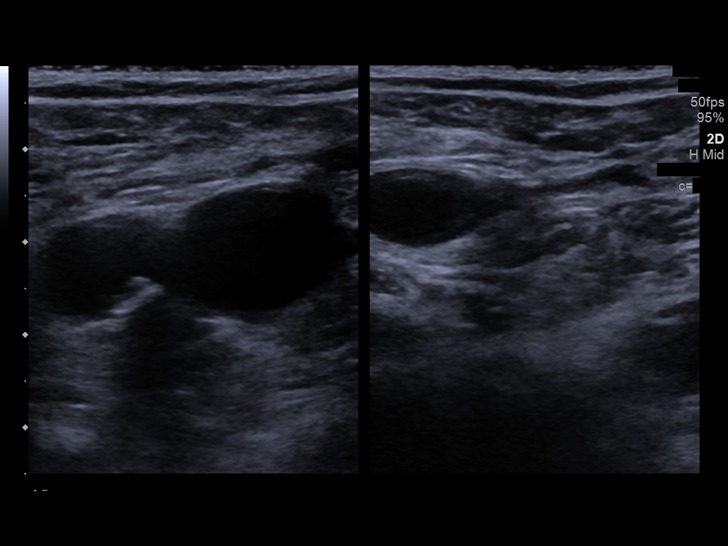
[im 7/71]
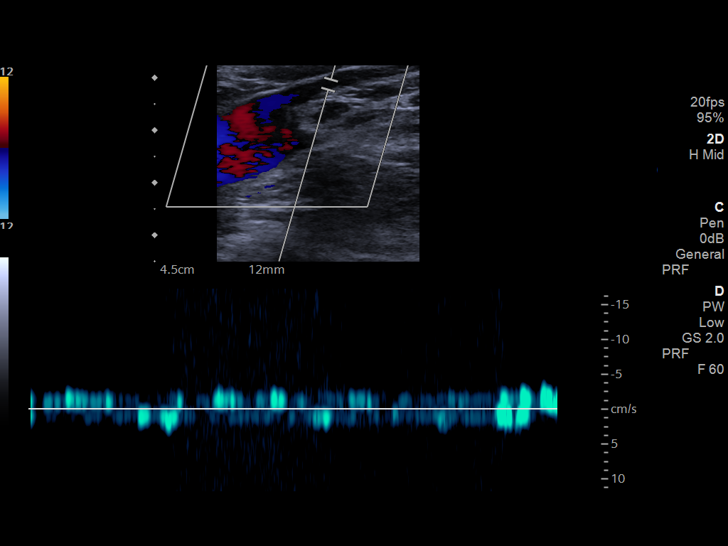
[im 13/71]
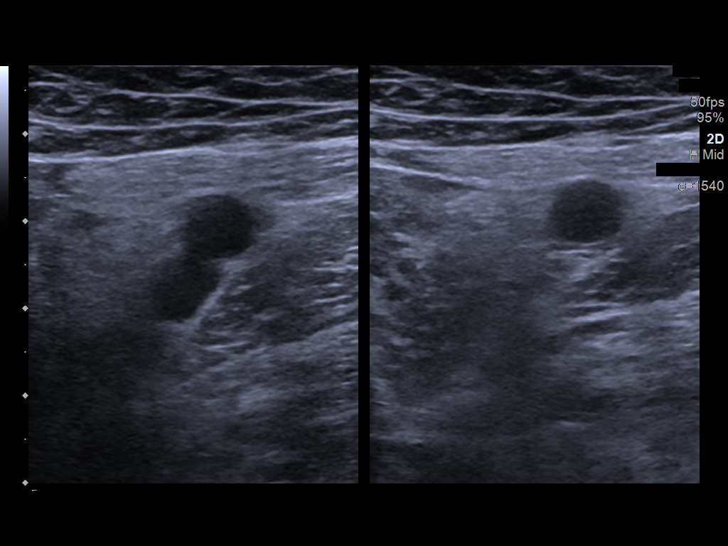
[im 19/71]
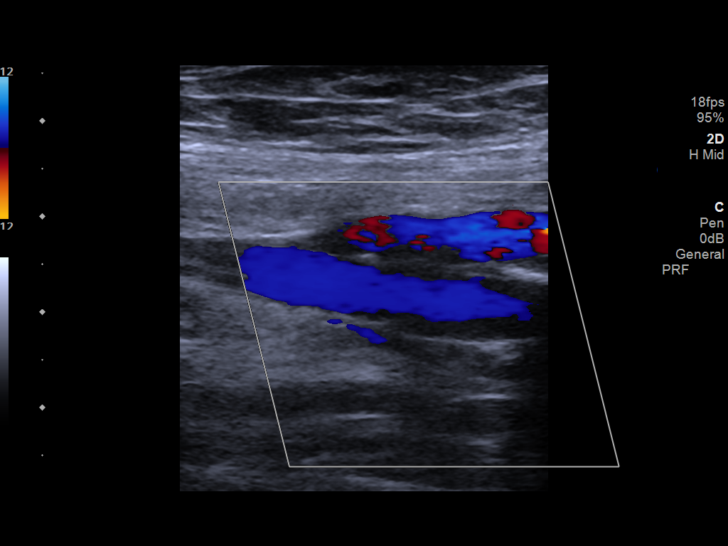
[im 25/71]
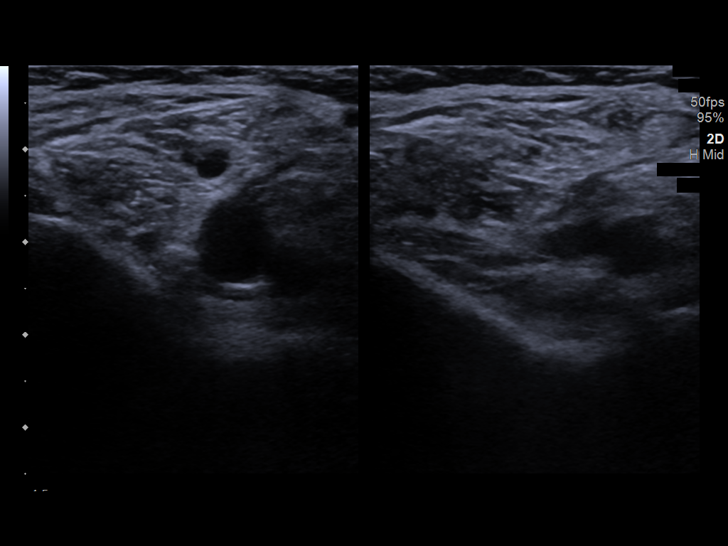
[im 31/71]
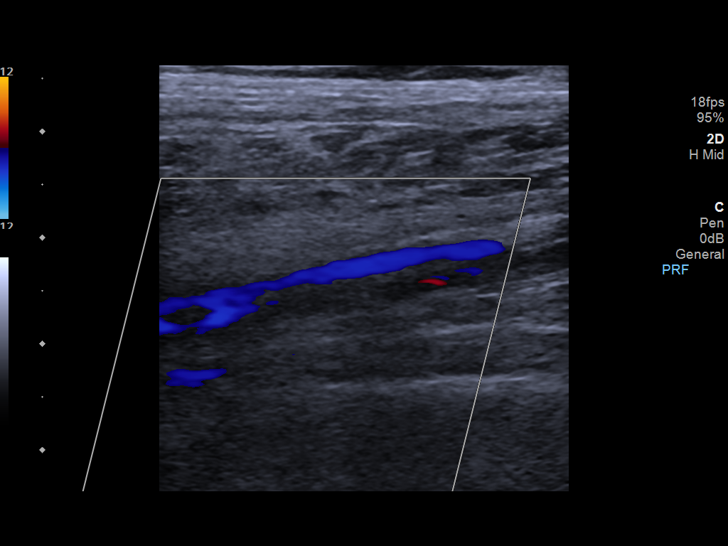
[im 37/71]
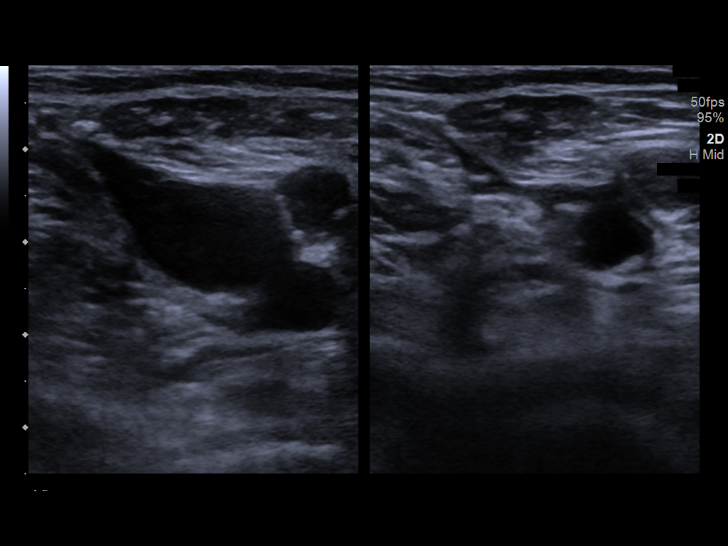
[im 40/71]
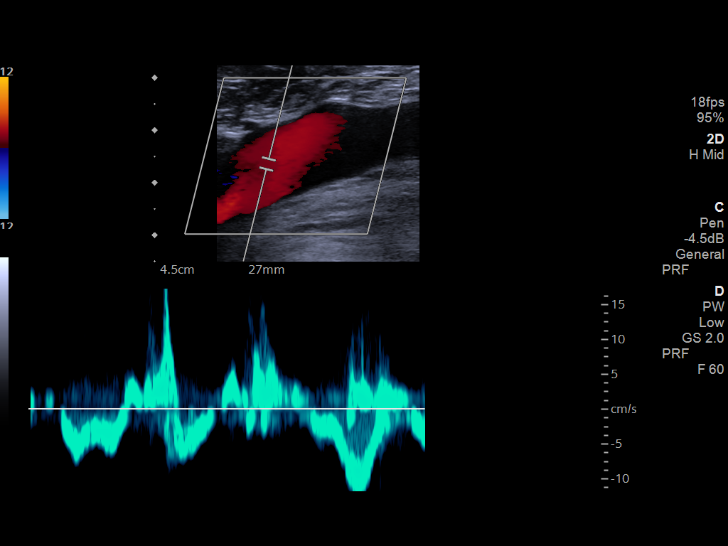
[im 46/71]
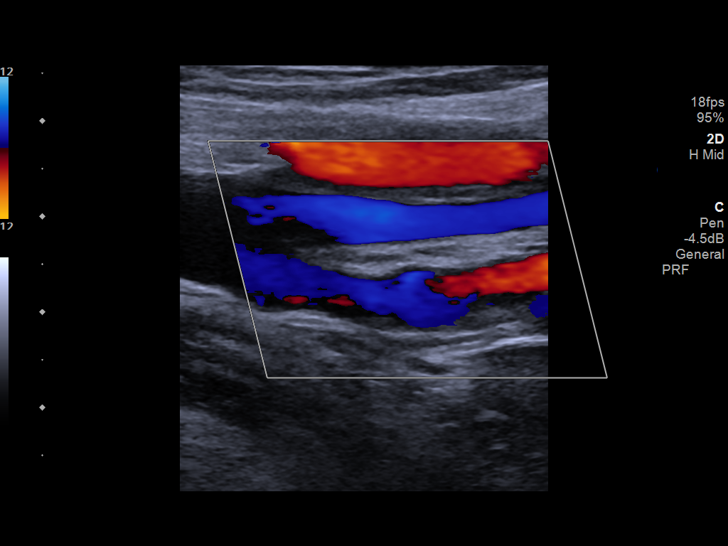
[im 52/71]
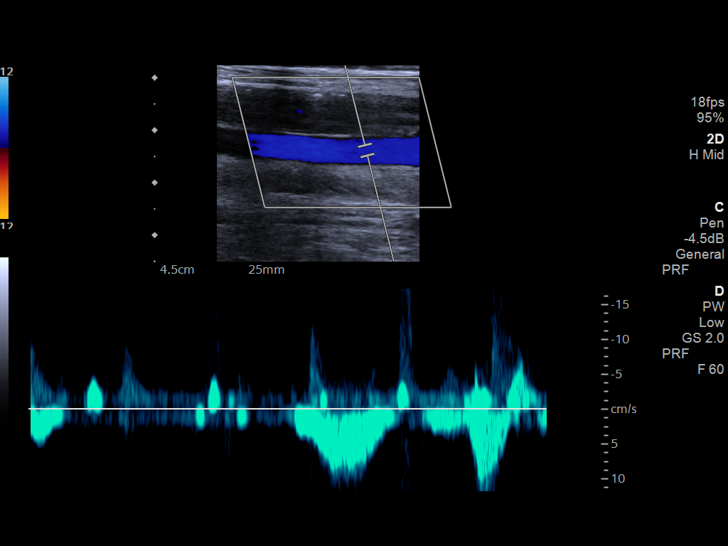
[im 58/71]
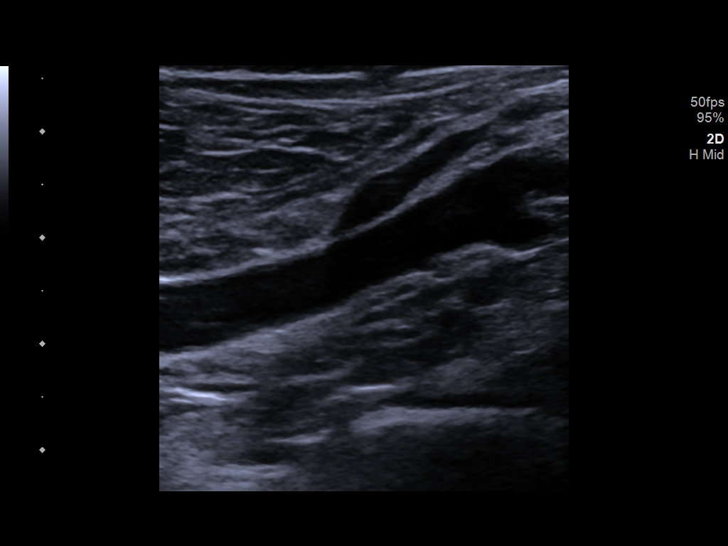
[im 64/71]
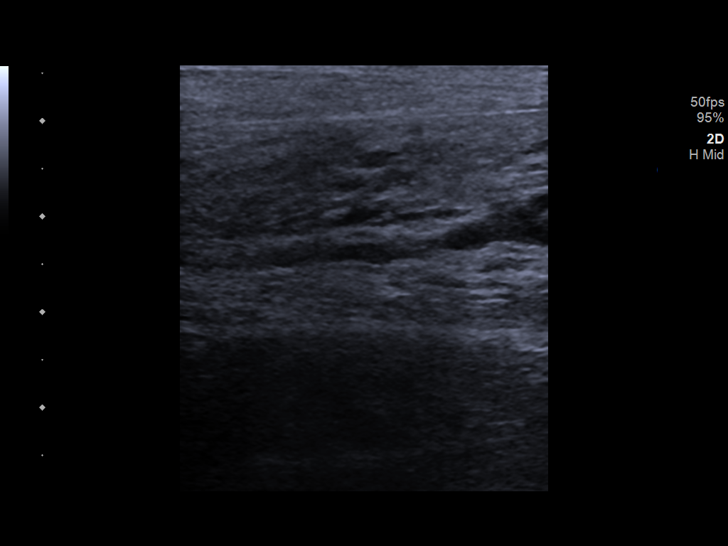
[im 71/71]
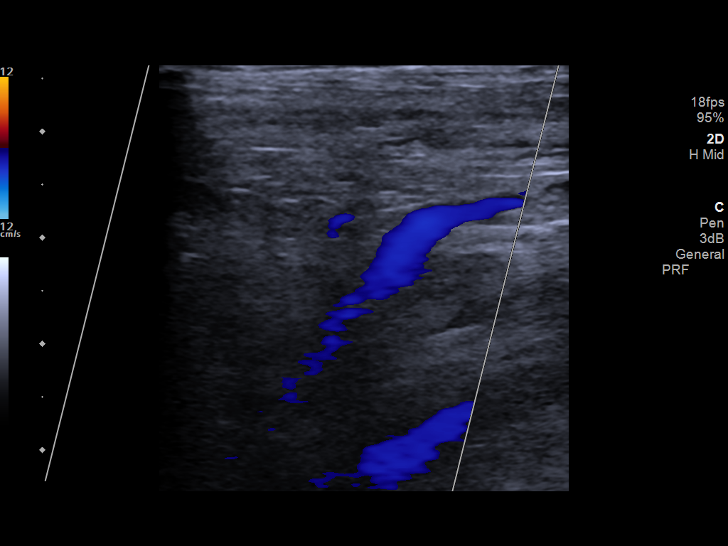

[13 of 24 positions shown; findings below may reference images not displayed]

FINDINGS: RIGHT LOWER EXTREMITY

Common Femoral Vein: No evidence of thrombus. Normal
compressibility, respiratory phasicity and response to augmentation.

Saphenofemoral Junction: No evidence of thrombus. Normal
compressibility and flow on color Doppler imaging.

Profunda Femoral Vein: No evidence of thrombus. Normal
compressibility and flow on color Doppler imaging.

Femoral Vein: No evidence of thrombus. Normal compressibility,
respiratory phasicity and response to augmentation.

Popliteal Vein: No evidence of thrombus. Normal compressibility,
respiratory phasicity and response to augmentation.

Calf Veins: No evidence of thrombus. Normal compressibility and flow
on color Doppler imaging.

Superficial Great Saphenous Vein: No evidence of thrombus. Normal
compressibility.

Venous Reflux:  None.

Other Findings:  None.

LEFT LOWER EXTREMITY

Common Femoral Vein: No evidence of thrombus. Normal
compressibility, respiratory phasicity and response to augmentation.

Saphenofemoral Junction: No evidence of thrombus. Normal
compressibility and flow on color Doppler imaging.

Profunda Femoral Vein: No evidence of thrombus. Normal
compressibility and flow on color Doppler imaging.

Femoral Vein: No evidence of thrombus. Normal compressibility,
respiratory phasicity and response to augmentation.

Popliteal Vein: No evidence of thrombus. Normal compressibility,
respiratory phasicity and response to augmentation.

Calf Veins: No evidence of thrombus. Normal compressibility and flow
on color Doppler imaging.

Superficial Great Saphenous Vein: No evidence of thrombus. Normal
compressibility.

Venous Reflux:  None.

Other Findings:  None.
IMPRESSION: No evidence of deep venous thrombosis in either lower extremity.

## 2022-03-14 MED ORDER — POTASSIUM CHLORIDE CRYS ER 20 MEQ PO TBCR
40.0000 meq | EXTENDED_RELEASE_TABLET | Freq: Once | ORAL | Status: AC
  Administered 2022-03-14: 40 meq via ORAL
  Filled 2022-03-14: qty 2

## 2022-03-14 NOTE — Evaluation (Signed)
Physical Therapy Evaluation Patient Details Name: Tony Sullivan MRN: [id number] DOB: 04/15/1961 Today's Date: 03/14/2022  History of Present Illness  Tony Sullivan is a 61 y.o. male with medical history significant for COPD with chronic hypoxemia on 3 L nasal cannula, tobacco abuse, dyslipidemia, hypertension, anxiety, and GERD who presented to the ED with complaints of some chest pain that awoke him from sleep.  This was near the center of his chest with some radiation down his left arm.  He was also having some bilateral lower extremity edema in his feet.  He states that the pain is worsened with deep breaths, but is present at baseline as well.  He has been coughing as per his usual with no significant sputum production.  No fevers or chills noted.  He continues to smoke, but is working on quitting.  He was seen in the ED yesterday with complaints of increased shortness of breath and cough and was noted to have some equipment issues at home with his O2 concentrator nebulizer and this was addressed by [facility] and he was discharged home.     Recent discharge on 5/6 after admission on 5/3 for COPD exacerbation noted.  He was kept on prednisone taper and was given Levaquin at that time.    Clinical Impression  On therapist arrival; patient is lying in bed. States he is having some stomach pain due to constipation.  He is able to answer all questions appropriately and follow commands; demonstrates good mobility of his arms and legs. Therapist guides him in supine lower extremity therapeutic exercises to include ankle pumps, heel slides and hip abduction.  When asked to come to EOB, patient refuses stating constipation as he reason.  Therapist encouraged him to move as this aids constipation and he again refuses.  Nursing notified of above.  Patient will benefit from continued therapy services while he is here in the hospital and and the below recommended venue of care to address deficits and promote optimal  functional mobility.        Recommendations for follow up therapy are one component of a multi-disciplinary discharge planning process, led by the attending physician.  Recommendations may be updated based on patient status, additional functional criteria and insurance authorization.  Follow Up Recommendations Skilled nursing-short term rehab (<3 hours/day)    Assistance Recommended at Discharge Frequent or constant Supervision/Assistance  Patient can return home with the following  A lot of help with walking and/or transfers;A lot of help with bathing/dressing/bathroom;Help with stairs or ramp for entrance    Equipment Recommendations None recommended by PT  Recommendations for Other Services       Functional Status Assessment Patient has had a recent decline in their functional status and demonstrates the ability to make significant improvements in function in a reasonable and predictable amount of time.     Precautions / Restrictions        Mobility  Bed Mobility Overal bed mobility: Needs Assistance             General bed mobility comments: refused to roll but good  AROM of extremities    Transfers Overall transfer level: Needs assistance                 General transfer comment: needs A for bed to chair transfer per his report    Ambulation/Gait               General Gait Details: non ambulatory  Stairs  Wheelchair Mobility    Modified Rankin (Stroke Patients Only)       Balance Overall balance assessment: Needs assistance     Sitting balance - Comments: refused to sit on EOB                                     Pertinent Vitals/Pain Pain Assessment Pain Assessment: 0-10 Pain Score: 5  Pain Location: stomach; constipation Pain Descriptors / Indicators: Cramping, Discomfort Pain Intervention(s): Limited activity within patient's tolerance    Home Living Family/patient expects to be discharged to::  Private residence Living Arrangements: Other (Comment) Available Help at Discharge: Personal care attendant;Friend(s) Type of Home: House Home Access: Ramped entrance       Home Layout: Able to live on main level with bedroom/bathroom Home Equipment: Rolling Walker (2 wheels);Cane - single point;Wheelchair - power;BSC/3in1;Wheelchair - manual Additional Comments: aide available 3hr/day 7 days/week; has 2 roomates    Prior Function Prior Level of Function : Needs assist       Physical Assist : Mobility (physical);ADLs (physical) Mobility (physical): Transfers ADLs (physical): Bathing;Dressing;Toileting;IADLs Mobility Comments: pt reports he typically performs transfers to/from power chair, does not ambulate       Hand Dominance   Dominant Hand: Right    Extremity/Trunk Assessment   Upper Extremity Assessment Upper Extremity Assessment: Generalized weakness    Lower Extremity Assessment Lower Extremity Assessment: Generalized weakness       Communication   Communication: No difficulties  Cognition Arousal/Alertness: Awake/alert Behavior During Therapy: WFL for tasks assessed/performed Overall Cognitive Status: Within Functional Limits for tasks assessed                                 General Comments: answered questions appropriately; able to follow commands but refused to sit on EOB        General Comments      Exercises Other Exercises Other Exercises: supine bilat UE shoulder flex x 5, ankle pumps x 5, heel slides x 5, hip abduction x 5   Assessment/Plan    PT Assessment Patient needs continued PT services  PT Problem List Decreased strength;Decreased activity tolerance;Decreased mobility       PT Treatment Interventions DME instruction;Balance training;Functional mobility training;Patient/family education;Therapeutic activities;Therapeutic exercise;Wheelchair mobility training    PT Goals (Current goals can be found in the Care Plan  section)  Acute Rehab PT Goals Patient Stated Goal: return home PT Goal Formulation: With patient Time For Goal Achievement: 03/28/22 Potential to Achieve Goals: Fair    Frequency Min 2X/week     Co-evaluation               AM-PAC PT "6 Clicks" Mobility  Outcome Measure Help needed turning from your back to your side while in a flat bed without using bedrails?: Total Help needed moving from lying on your back to sitting on the side of a flat bed without using bedrails?: Total Help needed moving to and from a bed to a chair (including a wheelchair)?: Total Help needed standing up from a chair using your arms (e.g., wheelchair or bedside chair)?: Total Help needed to walk in hospital room?: Total Help needed climbing 3-5 steps with a railing? : Total 6 Click Score: 6    End of Session Equipment Utilized During Treatment: Oxygen Activity Tolerance: Patient limited by pain Patient left: in bed;with call  bell/phone within reach Nurse Communication: Mobility status PT Visit Diagnosis: Muscle weakness (generalized) (M62.81);Pain    Time: 1050-1115 PT Time Calculation (min) (ACUTE ONLY): 25 min   Charges:   PT Evaluation $PT Eval Low Complexity: 1 Low PT Treatments $Therapeutic Exercise: 8-22 mins        11:31 AM, 03/14/22 Mery Guadalupe Small Damia Bobrowski MPT [facility] [place] [phone] [phone]

## 2022-03-14 NOTE — Plan of Care (Signed)
  Problem: Acute Rehab PT Goals(only PT should resolve) Goal: Pt Will Go Supine/Side To Sit Outcome: Progressing Flowsheets (Taken 03/14/2022 1132) Pt will go Supine/Side to Sit: with minimal assist Goal: Patient Will Perform Sitting Balance Outcome: Progressing Flowsheets (Taken 03/14/2022 1132) Patient will perform sitting balance: with minimal assist Goal: Patient Will Transfer Sit To/From Stand Outcome: Progressing Flowsheets (Taken 03/14/2022 1132) Patient will transfer sit to/from stand: with moderate assist Goal: Pt Will Transfer Bed To Chair/Chair To Bed Outcome: Progressing Flowsheets (Taken 03/14/2022 1132) Pt will Transfer Bed to Chair/Chair to Bed: with mod assist

## 2022-03-14 NOTE — TOC Initial Note (Addendum)
Transition of Care [facility]) - Initial/Assessment Note    Patient Details  Name: Tony Sullivan MRN: [id number] Date of Birth: 04/02/1961  Transition of Care [facility]) CM/SW Contact:    Boneta Lucks, RN Phone Number: 03/14/2022, 3:55 PM  Clinical Narrative:       Patient admitted with Pneumonia and PE. Patient has a high risk for readmission. Patient lives alone, has a motorized wheelchair. PT is recommending SNF. Patient has medicaid and does not want to give up any of his check. Explained due to due to drug use, TOC may not get a bed offer. He is questioning what is PT going to help him with? He walks at times. I explained just to get stronger and walk more times than not. He is agreeable to home health. TOC will check with agencies.  TOC started PASSR, it is pending, need documents. TOC to follow and cancel if patient chooses to go home.            Addendum : PASSR cancelled with  Must.  Patient discharging home today. Uses home oxygen @ 3L. No home health agency accepted. Patient is aware. He did not feel he need HH, he is at his baseline.    Expected Discharge Plan: [place] Barriers to Discharge: Continued Medical Work up   Patient Goals and CMS Choice Patient states their goals for this hospitalization and ongoing recovery are:: to return home. CMS Medicare.gov Compare Post Acute Care list provided to:: Patient   Expected Discharge Plan and Services Expected Discharge Plan: [place]      Living arrangements for the past 2 months: Single Family Home        Prior Living Arrangements/Services Living arrangements for the past 2 months: Single Family Home Lives with:: Self Patient language and need for interpreter reviewed:: Yes Do you feel safe going back to the place where you live?: Yes      Need for Family Participation in Patient Care: Yes (Comment) Care giver support system in place?: Yes (comment) Current home services: DME Criminal  Activity/Legal Involvement Pertinent to Current Situation/Hospitalization: No - Comment as needed  Activities of Daily Living Home Assistive Devices/Equipment: Wheelchair, Bedside commode/3-in-1, Eyeglasses, Shower chair with back ADL Screening (condition at time of admission) Patient's cognitive ability adequate to safely complete daily activities?: Yes Is the patient deaf or have difficulty hearing?: No Does the patient have difficulty seeing, even when wearing glasses/contacts?: No Does the patient have difficulty concentrating, remembering, or making decisions?: No Patient able to express need for assistance with ADLs?: Yes Does the patient have difficulty dressing or bathing?: Yes Independently performs ADLs?: No Communication: Independent Dressing (OT): Needs assistance Is this a change from baseline?: Pre-admission baseline Grooming: Needs assistance Is this a change from baseline?: Pre-admission baseline Feeding: Independent Bathing: Needs assistance Is this a change from baseline?: Pre-admission baseline Toileting: Needs assistance Is this a change from baseline?: Pre-admission baseline In/Out Bed: Needs assistance Is this a change from baseline?: Pre-admission baseline Walks in Home: Needs assistance Is this a change from baseline?: Pre-admission baseline Does the patient have difficulty walking or climbing stairs?: Yes Weakness of Legs: Both Weakness of Arms/Hands: None  Permission Sought/Granted    Emotional Assessment     Affect (typically observed): Accepting Orientation: : Oriented to Self, Oriented to Place, Oriented to  Time, Oriented to Situation Alcohol / Substance Use: Not Applicable Psych Involvement: No (comment)  Admission diagnosis:  Pneumonia [J18.9] Chest pain, unspecified type [  R07.9] Patient Active Problem List   Diagnosis Date Noted   Pneumonia 03/13/2022   COPD exacerbation ([place]) 02/27/2022   Pedal edema 02/19/2022   CAP (community  acquired pneumonia) 01/07/2022   Prolonged QT interval 01/07/2022   Glaucoma 01/07/2022   Elevated troponin 10/27/2021   Hyperglycemia 10/27/2021   Elevated MCV 10/27/2021   Respiratory failure with hypoxia and hypercapnia ([place]) 10/27/2021   Acute exacerbation of chronic obstructive pulmonary disease (COPD) ([place]) 10/02/2021   Hypophosphatemia 10/02/2021   Hypoalbuminemia due to protein-calorie malnutrition ([place]) 10/02/2021   Physical deconditioning 07/26/2021   Numbness and tingling in left arm 05/06/2021   Leukocytosis 05/05/2021   Severe headache 05/05/2021   Mixed hyperlipidemia 05/24/2020   Hypokalemia 05/24/2020   Chest pain 05/23/2020   Bipolar disorder ([place]) 05/23/2020   HNP (herniated nucleus pulposus), cervical 09/06/2019   Abdominal pain    Poor dentition 11/04/2017   Acute on chronic respiratory failure with hypoxia and hypercapnia ([place]) 08/29/2017   ETOH abuse 03/30/2017   Cigarette smoker 03/30/2017   Acute respiratory failure with hypoxia and hypercapnia ([facility]) 03/29/2017   Special screening for malignant neoplasms, colon    Benign neoplasm of ascending colon    Benign neoplasm of descending colon    Hoarseness 08/22/2016   GERD (gastroesophageal reflux disease) 08/22/2016   Atypical chest pain 12/05/2015   Essential hypertension 06/15/2014   Pectoralis muscle strain 01/11/2014   COPD with acute exacerbation ([place]) 01/10/2014   COPD (chronic obstructive pulmonary disease) ([place]) 01/08/2014   Tobacco abuse 01/08/2014   Cough 01/08/2014   Chronic respiratory failure with hypoxia ([place]) 01/08/2014   PCP:  [name], FNP Pharmacy:   [place], [address] AT [address]. [place] [address] Phone: [phone] Fax: [phone]   Readmission Risk Interventions    03/14/2022    3:54 PM 02/28/2022   10:40 AM 09/08/2019    3:22 PM  Readmission Risk Prevention Plan   Transportation Screening Complete Complete Complete  PCP or Specialist Appt within 3-5 Days   Complete  HRI or [place]   Complete  Social Work Consult for [place] Planning/Counseling   Complete  Palliative Care Screening   Not Applicable  Medication Review Press photographer) Complete Complete Complete  PCP or Specialist appointment within 3-5 days of discharge Not Complete    HRI or [place] Complete Complete   SW Recovery Care/Counseling Consult Complete Complete   Palliative Care Screening Not Applicable Not [place] Patient Refused Not Applicable

## 2022-03-14 NOTE — Progress Notes (Signed)
PROGRESS NOTE    Tony Sullivan  IWP:[id number] DOB: 04/15/1961 DOA: 03/13/2022 PCP: [name], FNP   Brief Narrative:    Tony Sullivan is a 61 y.o. male with medical history significant for COPD with chronic hypoxemia on 3 L nasal cannula, tobacco abuse, dyslipidemia, hypertension, anxiety, and GERD who presented to the ED with complaints of some chest pain that awoke him from sleep.  He was admitted with acute on chronic hypoxemic respiratory failure secondary to pneumonia with possible aspiration and then later also noted to have PE.  Assessment & Plan:   Principal Problem:   Pneumonia Active Problems:   COPD (chronic obstructive pulmonary disease) (HCC)   Leukocytosis   Tobacco abuse   Essential hypertension   GERD (gastroesophageal reflux disease)   Acute on chronic respiratory failure with hypoxia and hypercapnia (HCC)   Chest pain   Mixed hyperlipidemia   Hypokalemia  Assessment and Plan:  Acute on chronic hypoxemic respiratory failure secondary to PE and pneumonia with possible aspiration -Currently on 6 L nasal cannula with baseline 3 L, currently on 4 L -CT imaging with findings suspicious for some aspiration with questionable need for bronchoscopy; plan to consult with pulmonology for further evaluation especially given recent readmission -Continue Levaquin with penicillin allergy noted -Continue full dose Lovenox and obtain lower extremity Dopplers -Try to obtain sputum cultures and sensitivity -Respiratory panel negative -Antiemetics -Up in chair and flutter valve    History of COPD -Recent hospitalization for exacerbation, does not appear to have acute bronchospasm at this time -DuoNebs ordered as needed -Appears to be on chronic prednisone which will be continued   Atypical chest pain likely secondary to PE -Troponins unremarkable -EKG with nonspecific changes -2D echocardiogram performed earlier this month with EF 60-65%, no WMA, grade 1  diastolic dysfunction -BNP low and patient appears euvolemic  Generalized weakness with difficulty ambulating -Typically gets around with a motorized chair at home, but also is having trouble with transfers now -PT evaluation ordered   Essential hypertension -Continue losartan -Heart healthy diet   Dyslipidemia -Continue statin   Prior thoracic compression fractures -Occurred back in 2019, nothing new   Tobacco abuse -Working on cessation, discussed    DVT prophylaxis: Full dose Lovenox Code Status: Full Family Communication: None at bedside Disposition Plan:  Status is: Inpatient Remains inpatient appropriate because: Need for ongoing IV medications.   Consultants:  Pulmonology  Procedures:  See below  Antimicrobials:  Levaquin   Subjective: Patient seen and evaluated today with ongoing chest pain noted.  His oxygen requirements are downward trending.  No acute overnight events noted.  He continues to feel weak and unable to ambulate.  Objective: Vitals:   03/13/22 2230 03/14/22 0059 03/14/22 0536 03/14/22 0756  BP: (!) 162/88 119/83 (!) 141/100   Pulse: 72 86 69   Resp: 20  20   Temp:  97.8 F (36.6 C) 98.2 F (36.8 C)   TempSrc:  Axillary Axillary   SpO2: 100% 96% 100% 97%  Weight:      Height:        Intake/Output Summary (Last 24 hours) at 03/14/2022 0846 Last data filed at 03/14/2022 5053 Gross per 24 hour  Intake 441.67 ml  Output 1150 ml  Net -708.33 ml   Filed Weights   03/13/22 0236 03/13/22 1229  Weight: 65.8 kg 61.2 kg    Examination:  General exam: Appears calm and comfortable  Respiratory system: Clear to auscultation. Respiratory effort normal.  4 L  nasal cannula Cardiovascular system: S1 & S2 heard, RRR.  Gastrointestinal system: Abdomen is soft Central nervous system: Alert and awake Extremities: No edema Skin: No significant lesions noted Psychiatry: Flat affect.    Data Reviewed: I have personally reviewed following  labs and imaging studies  CBC: Recent Labs  Lab 03/13/22 0312 03/14/22 0340  WBC 15.1* 12.4*  HGB 13.3 11.9*  HCT 42.2 39.3  MCV 101.2* 101.0*  PLT 218 622   Basic Metabolic Panel: Recent Labs  Lab 03/13/22 0312 03/14/22 0340  NA 142 140  K 2.9* 3.5  CL 98 99  CO2 36* 34*  GLUCOSE 96 126*  BUN 17 17  CREATININE 0.83 0.88  CALCIUM 8.6* 8.4*  MG  --  2.3   GFR: Estimated Creatinine Clearance: 79.2 mL/min (by C-G formula based on SCr of 0.88 mg/dL). Liver Function Tests: No results for input(s): AST, ALT, ALKPHOS, BILITOT, PROT, ALBUMIN in the last 168 hours. No results for input(s): LIPASE, AMYLASE in the last 168 hours. No results for input(s): AMMONIA in the last 168 hours. Coagulation Profile: No results for input(s): INR, PROTIME in the last 168 hours. Cardiac Enzymes: No results for input(s): CKTOTAL, CKMB, CKMBINDEX, TROPONINI in the last 168 hours. BNP (last 3 results) No results for input(s): PROBNP in the last 8760 hours. HbA1C: No results for input(s): HGBA1C in the last 72 hours. CBG: No results for input(s): GLUCAP in the last 168 hours. Lipid Profile: No results for input(s): CHOL, HDL, LDLCALC, TRIG, CHOLHDL, LDLDIRECT in the last 72 hours. Thyroid Function Tests: Recent Labs    03/14/22 0340  TSH 1.353   Anemia Panel: No results for input(s): VITAMINB12, FOLATE, FERRITIN, TIBC, IRON, RETICCTPCT in the last 72 hours. Sepsis Labs: No results for input(s): PROCALCITON, LATICACIDVEN in the last 168 hours.  Recent Results (from the past 240 hour(s))  Respiratory (~20 pathogens) panel by PCR     Status: None   Collection Time: 03/13/22  8:04 AM   Specimen: Nasopharyngeal Swab; Respiratory  Result Value Ref Range Status   Adenovirus NOT DETECTED NOT DETECTED Final   Coronavirus 229E NOT DETECTED NOT DETECTED Final    Comment: (NOTE) The Coronavirus on the Respiratory Panel, DOES NOT test for the novel  Coronavirus (2019 nCoV)    Coronavirus  HKU1 NOT DETECTED NOT DETECTED Final   Coronavirus NL63 NOT DETECTED NOT DETECTED Final   Coronavirus OC43 NOT DETECTED NOT DETECTED Final   Metapneumovirus NOT DETECTED NOT DETECTED Final   Rhinovirus / Enterovirus NOT DETECTED NOT DETECTED Final   Influenza A NOT DETECTED NOT DETECTED Final   Influenza B NOT DETECTED NOT DETECTED Final   Parainfluenza Virus 1 NOT DETECTED NOT DETECTED Final   Parainfluenza Virus 2 NOT DETECTED NOT DETECTED Final   Parainfluenza Virus 3 NOT DETECTED NOT DETECTED Final   Parainfluenza Virus 4 NOT DETECTED NOT DETECTED Final   Respiratory Syncytial Virus NOT DETECTED NOT DETECTED Final   Bordetella pertussis NOT DETECTED NOT DETECTED Final   Bordetella Parapertussis NOT DETECTED NOT DETECTED Final   Chlamydophila pneumoniae NOT DETECTED NOT DETECTED Final   Mycoplasma pneumoniae NOT DETECTED NOT DETECTED Final    Comment: Performed at [facility], [place]. [address] [place], [place] [address]         Radiology Studies: CT Angio Chest Pulmonary Embolism (PE) W or WO Contrast  Addendum Date: 03/13/2022   ADDENDUM REPORT: 03/13/2022 20:03 ADDENDUM: Upon further review, there is a small nonocclusive hypodense acute thrombus in  the posterior right upper lobe segmental main artery on 4:41, partially occlusive thrombus in an adjacent apically directed first order segmental artery best seen on series 8 coronal reconstruction image 63 and 4: 38, with nonocclusive extension of thrombus a short distance into 2 of its subsegmental branch arteries on 4:37. This is a small embolic burden with no findings of associated acute right heart strain. Electronically Signed   By: Telford Nab M.D.   On: 03/13/2022 20:03   Result Date: 03/13/2022 CLINICAL DATA:  Chest tightness and shortness of breath. EXAM: CT ANGIOGRAPHY CHEST WITH CONTRAST TECHNIQUE: Multidetector CT imaging of the chest was performed using the standard protocol during bolus administration of intravenous  contrast. Multiplanar CT image reconstructions and MIPs were obtained to evaluate the vascular anatomy. RADIATION DOSE REDUCTION: This exam was performed according to the departmental dose-optimization program which includes automated exposure control, adjustment of the mA and/or kV according to patient size and/or use of iterative reconstruction technique. CONTRAST:  14mL OMNIPAQUE IOHEXOL 350 MG/ML SOLN COMPARISON:  Portable chest 03/11/2022 portable chest today, CTA chest 02/14/2022, CTA chest 05/12/2018. FINDINGS: Cardiovascular: The cardiac size is normal. There is scattered calcification in the coronary arteries. There is no pericardial effusion. The great vessels are patent with normal variant brachiobicarotid trunk. The ascending aorta is borderline aneurysmal measuring 3.9 cm. The remainder is within normal caliber limits. The pulmonary veins are decompressed. Prominent but unchanged pulmonary trunk again measuring 3.3 cm indicating arterial hypertension. The pulmonary arteries are free of thrombus at least through the segmental arteries with the subsegmental arterial bed for the most part obscured by breathing motion. Mediastinum/Nodes: No enlarged mediastinal, hilar, or axillary lymph nodes. Thyroid gland, trachea, and esophagus demonstrate no significant findings. Lungs/Pleura: Advanced centrilobular/panlobular emphysema predominating in the upper lobes. Are scattered linear scar-like opacities. There is again noted airway obstruction/impaction involving the left upper lobe apicoposterior segmental main bronchus with mucous plugging or debris continuing cephalad into several subsegmental bronchi, as before. There is increased right lower lobe posterior basal subsegmental bronchial impaction on series 6 axial 97-107. Increased posterior basal segmental and subsegmental left lower lobe bronchial impactions are also noted with increased layering fluid in the left main bronchus. There is increased  subpleural linear atelectasis in the posterior basal lower lobes compared to the last CT but no appreciable infiltrates. There is no pleural effusion, thickening or pneumothorax. Upper Abdomen: No acute findings. Musculoskeletal: Osteopenia. 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 There is a new moderate compression fracture of the T5 vertebral body with trace posterosuperior cortical retropulsion, 50% anterior and 40% posterior vertebral height loss, and a mild upper plate anterior wedge compression fracture of the T8 vertebral body without retropulsion, with 30% anterior and 25% posterior vertebral height loss. Again noted are there mild chronic compression fractures of T10, T11 and L1. Review of the MIP images confirms the above findings. IMPRESSION: 1. Negative for visible pulmonary embolism with the subsegmental arterial bed largely not evaluated to breathing motion. 2. Segmental/subsegmental airway opacification left upper lobe which was seen previously. Obstructing soft tissue mass is not excluded. Bronchoscopy recommended. 3. Increased fluid/debris in the lower lobe posterior basal segmental/subsegmental bronchi. Consider aspiration precautions. There is increased fluid in the left main bronchus. 4. Increased atelectasis in the posterior lung bases but no visible infiltrates. Severe COPD change. 5. Compression fractures of the T5 and 8 vertebral bodies new from 02/14/2022 but acuity indeterminate. Osteopenia and chronic compression fractures. 6. Borderline aneurysmal ascending aorta.  No aortic dissection or other significant ectasia. 7. Aortic and coronary artery atherosclerosis. Electronically Signed: By: Telford Nab M.D. On: 03/13/2022 04:57   DG Chest Port 1 View  Result Date: 03/13/2022 CLINICAL DATA:  Chest pain, shortness of breath EXAM: PORTABLE CHEST 1 VIEW COMPARISON:  01/09/2022 FINDINGS: Mild patchy/nodular right upper lobe opacities, raising the  possibility of mild pneumonia. Left lung is essentially clear. No pleural effusion or pneumothorax. The heart is normal in size. IMPRESSION: Mild patchy/nodular right upper lobe opacities, raising the possibility of mild pneumonia. Electronically Signed   By: Julian Hy M.D.   On: 03/13/2022 03:19        Scheduled Meds:  atorvastatin  40 mg Oral Daily   benztropine  0.5 mg Oral QHS   dextromethorphan-guaiFENesin  2 tablet Oral BID   diclofenac Sodium  2 g Topical QID   dorzolamide  1 drop Left Eye BID   enoxaparin (LOVENOX) injection  1 mg/kg Subcutaneous C14G   folic acid  1 mg Oral Daily   hydrochlorothiazide  12.5 mg Oral Daily   latanoprost  1 drop Both Eyes QHS   loratadine  10 mg Oral Daily   losartan  100 mg Oral Daily   loxapine  10 mg Oral QHS   mirtazapine  30 mg Oral QHS   mometasone-formoterol  2 puff Inhalation BID   multivitamin with minerals  1 tablet Oral Daily   pantoprazole  40 mg Oral BID AC   potassium chloride  40 mEq Oral Once   predniSONE  20 mg Oral Daily   risperiDONE  2 mg Oral QHS   senna-docusate  2 tablet Oral QHS   sodium chloride flush  3 mL Intravenous Q12H   thiamine  100 mg Oral Daily   Or   thiamine  100 mg Intravenous Daily   umeclidinium bromide  1 puff Inhalation Daily   Continuous Infusions:  sodium chloride     levofloxacin (LEVAQUIN) IV 750 mg (03/14/22 0544)     LOS: 1 day    Time spent: 35 minutes    Zain Lankford Darleen Crocker, DO [facility]  If 7PM-7AM, please contact night-coverage [email or web address] 03/14/2022, 8:46 AM

## 2022-03-15 ENCOUNTER — Telehealth: Payer: Self-pay | Admitting: Internal Medicine

## 2022-03-15 ENCOUNTER — Encounter (HOSPITAL_COMMUNITY): Payer: Self-pay | Admitting: Internal Medicine

## 2022-03-15 DIAGNOSIS — J189 Pneumonia, unspecified organism: Secondary | ICD-10-CM | POA: Diagnosis not present

## 2022-03-15 LAB — BASIC METABOLIC PANEL
Anion gap: 7 (ref 5–15)
BUN: 11 mg/dL (ref 6–20)
CO2: 32 mmol/L (ref 22–32)
Calcium: 8.4 mg/dL — ABNORMAL LOW (ref 8.9–10.3)
Chloride: 101 mmol/L (ref 98–111)
Creatinine, Ser: 0.8 mg/dL (ref 0.61–1.24)
GFR, Estimated: >60 mL/min (ref >60)
Glucose, Bld: 113 mg/dL — ABNORMAL HIGH (ref 70–99)
Potassium: 3.3 mmol/L — ABNORMAL LOW (ref 3.5–5.1)
Sodium: 140 mmol/L (ref 135–145)

## 2022-03-15 LAB — CBC
HCT: 39.2 % (ref 39.0–52.0)
Hemoglobin: 12.2 g/dL — ABNORMAL LOW (ref 13.0–17.0)
MCH: 31.1 pg (ref 26.0–34.0)
MCHC: 31.1 g/dL (ref 30.0–36.0)
MCV: 100 fL (ref 80.0–100.0)
Platelets: 185 10*3/uL (ref 150–400)
RBC: 3.92 MIL/uL — ABNORMAL LOW (ref 4.22–5.81)
RDW: 14.1 % (ref 11.5–15.5)
WBC: 12 10*3/uL — ABNORMAL HIGH (ref 4.0–10.5)
nRBC: 0 % (ref 0.0–0.2)

## 2022-03-15 LAB — NICOTINE SCREEN, URINE: Cotinine Ql Scrn, Ur: POSITIVE ng/mL — AB

## 2022-03-15 MED ORDER — SIMETHICONE 80 MG PO CHEW: 80.0000 mg | CHEWABLE_TABLET | Freq: Four times a day (QID) | ORAL | 0 refills | Status: DC | PRN

## 2022-03-15 MED ORDER — APIXABAN 5 MG PO TABS
10.0000 mg | ORAL_TABLET | Freq: Two times a day (BID) | ORAL | 0 refills | Status: DC
Start: 2022-03-15 — End: 2022-03-25

## 2022-03-15 MED ORDER — PREDNISONE 20 MG PO TABS: 20.0000 mg | ORAL_TABLET | Freq: Every day | ORAL | 0 refills | Status: DC

## 2022-03-15 MED ORDER — ALBUTEROL SULFATE (2.5 MG/3ML) 0.083% IN NEBU: 3.0000 mL | INHALATION_SOLUTION | Freq: Four times a day (QID) | RESPIRATORY_TRACT | Status: DC | PRN

## 2022-03-15 MED ORDER — KETOROLAC TROMETHAMINE 15 MG/ML IJ SOLN
15.0000 mg | Freq: Once | INTRAMUSCULAR | Status: AC
  Administered 2022-03-15: 15 mg via INTRAVENOUS
  Filled 2022-03-15: qty 1

## 2022-03-15 MED ORDER — LEVOFLOXACIN 750 MG PO TABS: 750.0000 mg | ORAL_TABLET | Freq: Every day | ORAL | 0 refills | Status: AC

## 2022-03-15 MED ORDER — APIXABAN 5 MG PO TABS: 5.0000 mg | ORAL_TABLET | Freq: Two times a day (BID) | ORAL | Status: DC

## 2022-03-15 MED ORDER — GABAPENTIN 100 MG PO CAPS: 100.0000 mg | ORAL_CAPSULE | Freq: Two times a day (BID) | ORAL | 0 refills | Status: DC

## 2022-03-15 MED ORDER — APIXABAN 5 MG PO TABS: 10.0000 mg | ORAL_TABLET | Freq: Two times a day (BID) | ORAL | Status: DC

## 2022-03-15 MED ORDER — ALBUTEROL SULFATE HFA 108 (90 BASE) MCG/ACT IN AERS
2.0000 | INHALATION_SPRAY | RESPIRATORY_TRACT | Status: DC | PRN
  Administered 2022-03-15: 2 via RESPIRATORY_TRACT
  Filled 2022-03-15: qty 6.7

## 2022-03-15 MED ORDER — APIXABAN 5 MG PO TABS
5.0000 mg | ORAL_TABLET | Freq: Two times a day (BID) | ORAL | Status: DC
Start: 2022-03-22 — End: 2022-03-15

## 2022-03-15 MED ORDER — APIXABAN 5 MG PO TABS
5.0000 mg | ORAL_TABLET | Freq: Two times a day (BID) | ORAL | 0 refills | Status: DC
Start: 2022-03-22 — End: 2022-05-08

## 2022-03-15 MED ORDER — GABAPENTIN 100 MG PO CAPS
100.0000 mg | ORAL_CAPSULE | Freq: Two times a day (BID) | ORAL | Status: DC
  Administered 2022-03-15: 100 mg via ORAL
  Filled 2022-03-15: qty 1

## 2022-03-15 MED ORDER — ALBUTEROL SULFATE HFA 108 (90 BASE) MCG/ACT IN AERS: 2.0000 | INHALATION_SPRAY | Freq: Four times a day (QID) | RESPIRATORY_TRACT | 2 refills | Status: DC | PRN

## 2022-03-15 MED ORDER — POTASSIUM CHLORIDE CRYS ER 20 MEQ PO TBCR
40.0000 meq | EXTENDED_RELEASE_TABLET | Freq: Once | ORAL | Status: AC
  Administered 2022-03-15: 40 meq via ORAL
  Filled 2022-03-15: qty 2

## 2022-03-15 NOTE — Discharge Summary (Signed)
Physician Discharge Summary  Tony Sullivan YTK:[id number] DOB: August 03, 1961 DOA: 03/13/2022  PCP: [name], FNP  Admit date: 03/13/2022  Discharge date: 03/15/2022  Admitted From:Home  Disposition:  Home  Recommendations for Outpatient Follow-up:  Follow up with PCP in 1-2 weeks Continue on Levaquin for 3 more days for treatment of pneumonia Continue on Eliquis as prescribed for treatment of PE and likely continue indefinitely as there appears to be no significant recent provoking factors Follow-up with pulmonology outpatient Continue home steroids and breathing treatments as needed  Home Health: Will be arranged  Equipment/Devices: Has home nasal cannula oxygen 3-4 L, gets around in a motorized wheelchair  Discharge Condition:Stable  CODE STATUS: Full  Diet recommendation: Heart Healthy  Brief/Interim Summary: Tony Sullivan is a 61 y.o. male with medical history significant for COPD with chronic hypoxemia on 3 L nasal cannula, tobacco abuse, dyslipidemia, hypertension, anxiety, and GERD who presented to the ED with complaints of some chest pain that awoke him from sleep.  He was admitted with acute on chronic hypoxemic respiratory failure secondary to pneumonia with possible aspiration and then later also noted to have PE.  He is currently on his baseline 3-4 L nasal cannula oxygen.  He was initially requiring up to 6 L nasal cannula.  he denies any further significant chest pain and will be transition to oral Eliquis and will remain on Levaquin as noted above for another 3 days to finish the course of treatment for pneumonia.  He has been given refills on his oral steroids and inhaler as requested.  Discharge Diagnoses:  Principal Problem:   Pneumonia Active Problems:   COPD (chronic obstructive pulmonary disease) (HCC)   Leukocytosis   Tobacco abuse   Essential hypertension   GERD (gastroesophageal reflux disease)   Acute on chronic respiratory failure with hypoxia  and hypercapnia (HCC)   Chest pain   Mixed hyperlipidemia   Hypokalemia  Principal discharge diagnosis: Acute on chronic hypoxemic respiratory failure secondary to small PE and community-acquired pneumonia with possible aspiration.  Discharge Instructions  Discharge Instructions     Diet - low sodium heart healthy   Complete by: As directed    Increase activity slowly   Complete by: As directed    No wound care   Complete by: As directed       Allergies as of 03/15/2022       Reactions   Penicillins Anaphylaxis   Has patient had a PCN reaction causing immediate rash, facial/tongue/throat swelling, SOB or lightheadedness with hypotension: Yes Has patient had a PCN reaction causing severe rash involving mucus membranes or skin necrosis: No Has patient had a PCN reaction that required hospitalization No Has patient had a PCN reaction occurring within the last 10 years: No If all of the above answers are "NO", then may proceed with Cephalosporin use.   Levocetirizine Other (See Comments)   Muscle cramps        Medication List     TAKE these medications    acetaminophen 325 MG tablet Commonly known as: TYLENOL Take 2 tablets (650 mg total) by mouth every 6 (six) hours as needed for mild pain (or Fever >/= 101).   albuterol (2.5 MG/3ML) 0.083% nebulizer solution Commonly known as: PROVENTIL Take 3 mLs (2.5 mg total) by nebulization every 4 (four) hours as needed for wheezing or shortness of breath.   albuterol 108 (90 Base) MCG/ACT inhaler Commonly known as: VENTOLIN HFA Inhale 2 puffs into the lungs every 6 (  six) hours as needed for wheezing or shortness of breath.   apixaban 5 MG Tabs tablet Commonly known as: ELIQUIS Take 2 tablets (10 mg total) by mouth 2 (two) times daily for 7 days.   apixaban 5 MG Tabs tablet Commonly known as: ELIQUIS Take 1 tablet (5 mg total) by mouth 2 (two) times daily. Start taking on: Mar 22, 2022   atorvastatin 40 MG  tablet Commonly known as: LIPITOR TAKE ONE TABLET BY MOUTH DAILY   benztropine 0.5 MG tablet Commonly known as: COGENTIN Take 0.5 mg by mouth at bedtime.   Breztri Aerosphere 160-9-4.8 MCG/ACT Aero Generic drug: Budeson-Glycopyrrol-Formoterol Inhale 2 puffs into the lungs in the morning and at bedtime.   cetirizine 10 MG tablet Commonly known as: ZYRTEC Take 10 mg by mouth daily.   diclofenac Sodium 1 % Gel Commonly known as: VOLTAREN Apply 2 g topically 4 (four) times daily as needed (pain).   dorzolamide 2 % ophthalmic solution Commonly known as: TRUSOPT Place 1 drop into the left eye 2 (two) times daily.   gabapentin 100 MG capsule Commonly known as: NEURONTIN Take 1 capsule (100 mg total) by mouth 2 (two) times daily.   guaiFENesin 200 MG tablet Take 2 tablets (400 mg total) by mouth every 6 (six) hours as needed for cough or to loosen phlegm.   hydrochlorothiazide 25 MG tablet Commonly known as: HYDRODIURIL Take 0.5 tablets (12.5 mg total) by mouth daily.   hydrOXYzine 25 MG tablet Commonly known as: ATARAX Take 1 tablet (25 mg total) by mouth 3 (three) times daily as needed for anxiety or nausea.   latanoprost 0.005 % ophthalmic solution Commonly known as: XALATAN Place 1 drop into both eyes at bedtime.   levofloxacin 750 MG tablet Commonly known as: Levaquin Take 1 tablet (750 mg total) by mouth daily for 3 days.   losartan 100 MG tablet Commonly known as: COZAAR Take 100 mg by mouth daily.   loxapine 10 MG capsule Commonly known as: LOXITANE Take 10 mg by mouth at bedtime.   mirtazapine 30 MG tablet Commonly known as: REMERON Take 1 tablet (30 mg total) by mouth at bedtime.   multivitamin with minerals Tabs tablet Take 1 tablet by mouth daily.   OXYGEN Inhale 3-4 L into the lungs as directed. 3-4 liters with sleep and exertion as needed for low oxygen   pantoprazole 40 MG tablet Commonly known as: PROTONIX Take 1 tablet (40 mg total) by mouth  daily.   polyethylene glycol 17 g packet Commonly known as: MIRALAX / GLYCOLAX TAKE 17 GRAMS BY MOUTH DAILY What changed: See the new instructions.   predniSONE 20 MG tablet Commonly known as: DELTASONE Take 1 tablet (20 mg total) by mouth daily.   risperiDONE 2 MG tablet Commonly known as: RISPERDAL Take 1 tablet (2 mg total) by mouth at bedtime.   senna-docusate 8.6-50 MG tablet Commonly known as: Senokot-S Take 2 tablets by mouth at bedtime. What changed:  when to take this reasons to take this   simethicone 80 MG chewable tablet Commonly known as: MYLICON Chew 1 tablet (80 mg total) by mouth 4 (four) times daily as needed for flatulence.        Follow-up Information     [name], FNP. Schedule an appointment as soon as possible for a visit in 1 week(s).   Specialty: Nurse Practitioner Contact information: [address] [place] [id number] [phone]  Allergies  Allergen Reactions   Penicillins Anaphylaxis    Has patient had a PCN reaction causing immediate rash, facial/tongue/throat swelling, SOB or lightheadedness with hypotension: Yes Has patient had a PCN reaction causing severe rash involving mucus membranes or skin necrosis: No Has patient had a PCN reaction that required hospitalization No Has patient had a PCN reaction occurring within the last 10 years: No If all of the above answers are "NO", then may proceed with Cephalosporin use.    Levocetirizine Other (See Comments)    Muscle cramps    Consultations: Pulmonology   Procedures/Studies: DG Chest 2 View  Result Date: 03/06/2022 CLINICAL DATA:  Chest pain. EXAM: CHEST - 2 VIEW COMPARISON:  Chest x-ray May 3, 23. FINDINGS: No consolidation. No visible pleural effusions or pneumothorax. Hyperinflation. Remote right rib fracture bony callus. IMPRESSION: Hyperinflation without acute abnormality. Electronically Signed   By: Margaretha Sheffield M.D.    On: 03/06/2022 14:09   DG Chest 2 View  Result Date: 02/19/2022 CLINICAL DATA:  Chest Pain EXAM: CHEST - 2 VIEW COMPARISON:  February 14, 2022. FINDINGS: No consolidation. No visible pleural effusions or pneumothorax. Emphysema. Remote right rib fracture. Cardiomediastinal silhouette is within normal limits. IMPRESSION: 1. No evidence of acute cardiopulmonary disease. 2.  Emphysema (ICD10-J43.9). Electronically Signed   By: Margaretha Sheffield M.D.   On: 02/19/2022 10:02   CT Angio Chest Pulmonary Embolism (PE) W or WO Contrast  Addendum Date: 03/13/2022   ADDENDUM REPORT: 03/13/2022 20:03 ADDENDUM: Upon further review, there is a small nonocclusive hypodense acute thrombus in the posterior right upper lobe segmental main artery on 4:41, partially occlusive thrombus in an adjacent apically directed first order segmental artery best seen on series 8 coronal reconstruction image 63 and 4: 38, with nonocclusive extension of thrombus a short distance into 2 of its subsegmental branch arteries on 4:37. This is a small embolic burden with no findings of associated acute right heart strain. Electronically Signed   By: Telford Nab M.D.   On: 03/13/2022 20:03   Result Date: 03/13/2022 CLINICAL DATA:  Chest tightness and shortness of breath. EXAM: CT ANGIOGRAPHY CHEST WITH CONTRAST TECHNIQUE: Multidetector CT imaging of the chest was performed using the standard protocol during bolus administration of intravenous contrast. Multiplanar CT image reconstructions and MIPs were obtained to evaluate the vascular anatomy. RADIATION DOSE REDUCTION: This exam was performed according to the departmental dose-optimization program which includes automated exposure control, adjustment of the mA and/or kV according to patient size and/or use of iterative reconstruction technique. CONTRAST:  172mL OMNIPAQUE IOHEXOL 350 MG/ML SOLN COMPARISON:  Portable chest 03/11/2022 portable chest today, CTA chest 02/14/2022, CTA chest 05/12/2018.  FINDINGS: Cardiovascular: The cardiac size is normal. There is scattered calcification in the coronary arteries. There is no pericardial effusion. The great vessels are patent with normal variant brachiobicarotid trunk. The ascending aorta is borderline aneurysmal measuring 3.9 cm. The remainder is within normal caliber limits. The pulmonary veins are decompressed. Prominent but unchanged pulmonary trunk again measuring 3.3 cm indicating arterial hypertension. The pulmonary arteries are free of thrombus at least through the segmental arteries with the subsegmental arterial bed for the most part obscured by breathing motion. Mediastinum/Nodes: No enlarged mediastinal, hilar, or axillary lymph nodes. Thyroid gland, trachea, and esophagus demonstrate no significant findings. Lungs/Pleura: Advanced centrilobular/panlobular emphysema predominating in the upper lobes. Are scattered linear scar-like opacities. There is again noted airway obstruction/impaction involving the left upper lobe apicoposterior segmental main bronchus with mucous plugging or debris continuing cephalad  into several subsegmental bronchi, as before. There is increased right lower lobe posterior basal subsegmental bronchial impaction on series 6 axial 97-107. Increased posterior basal segmental and subsegmental left lower lobe bronchial impactions are also noted with increased layering fluid in the left main bronchus. There is increased subpleural linear atelectasis in the posterior basal lower lobes compared to the last CT but no appreciable infiltrates. There is no pleural effusion, thickening or pneumothorax. Upper Abdomen: No acute findings. Musculoskeletal: Osteopenia. 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 There is a new moderate compression fracture of the T5 vertebral body with trace posterosuperior cortical retropulsion, 50% anterior and 40% posterior vertebral height loss, and a mild upper plate anterior  wedge compression fracture of the T8 vertebral body without retropulsion, with 30% anterior and 25% posterior vertebral height loss. Again noted are there mild chronic compression fractures of T10, T11 and L1. Review of the MIP images confirms the above findings. IMPRESSION: 1. Negative for visible pulmonary embolism with the subsegmental arterial bed largely not evaluated to breathing motion. 2. Segmental/subsegmental airway opacification left upper lobe which was seen previously. Obstructing soft tissue mass is not excluded. Bronchoscopy recommended. 3. Increased fluid/debris in the lower lobe posterior basal segmental/subsegmental bronchi. Consider aspiration precautions. There is increased fluid in the left main bronchus. 4. Increased atelectasis in the posterior lung bases but no visible infiltrates. Severe COPD change. 5. Compression fractures of the T5 and 8 vertebral bodies new from 02/14/2022 but acuity indeterminate. Osteopenia and chronic compression fractures. 6. Borderline aneurysmal ascending aorta. No aortic dissection or other significant ectasia. 7. Aortic and coronary artery atherosclerosis. Electronically Signed: By: Telford Nab M.D. On: 03/13/2022 04:57   CT Angio Chest Pulmonary Embolism (PE) W or WO Contrast  Result Date: 02/14/2022 CLINICAL DATA:  Pulmonary embolism suspected hemoptysis since eighteenth EXAM: CT ANGIOGRAPHY CHEST WITH CONTRAST TECHNIQUE: Multidetector CT imaging of the chest was performed using the standard protocol during bolus administration of intravenous contrast. Multiplanar CT image reconstructions and MIPs were obtained to evaluate the vascular anatomy. RADIATION DOSE REDUCTION: This exam was performed according to the departmental dose-optimization program which includes automated exposure control, adjustment of the mA and/or kV according to patient size and/or use of iterative reconstruction technique. CONTRAST:  150mL OMNIPAQUE IOHEXOL 350 MG/ML SOLN  COMPARISON:  10/17/2021 FINDINGS: Cardiovascular: Satisfactory opacification of the pulmonary arteries to the segmental level. No evidence of pulmonary embolism when accounting for levels of motion artifact. Normal heart size. No pericardial effusion. Mild coronary atheromatous calcification. Mediastinum/Nodes: Negative for adenopathy. Lungs/Pleura: Advanced emphysema. There is airway obstruction and opacification involving the apical segment of the left upper lobe. No discrete enhancing mass is seen. Generalized airway thickening with bronchomalacia. No edema, effusion, or pneumothorax. No alveolar hemorrhage seen. Upper Abdomen: Negative Musculoskeletal: Small area of AVN at the right humeral head. T10, T11, L1 remote compression fractures. Review of the MIP images confirms the above findings. IMPRESSION: 1. Segmental airway opacification in the left upper lobe, possibly a localizing sign related to the hemoptysis. No discrete lesion, but recommend imaging or bronchoscopic follow-up to document clearance. 2. Negative for pulmonary embolism. Motion artifact at the lung bases. 3. Advanced emphysema. Electronically Signed   By: Jorje Guild M.D.   On: 02/14/2022 07:53   US Venous Img Lower Bilateral (DVT)  Result Date: 03/14/2022 CLINICAL DATA:  Bilateral lower extremity pain for the past 5 years EXAM: BILATERAL LOWER EXTREMITY VENOUS DOPPLER ULTRASOUND TECHNIQUE: Gray-scale sonography with graded compression, as  well as color Doppler and duplex ultrasound were performed to evaluate the lower extremity deep venous systems from the level of the common femoral vein and including the common femoral, femoral, profunda femoral, popliteal and calf veins including the posterior tibial, peroneal and gastrocnemius veins when visible. The superficial great saphenous vein was also interrogated. Spectral Doppler was utilized to evaluate flow at rest and with distal augmentation maneuvers in the common femoral, femoral and  popliteal veins. COMPARISON:  None Available. FINDINGS: RIGHT LOWER EXTREMITY Common Femoral Vein: No evidence of thrombus. Normal compressibility, respiratory phasicity and response to augmentation. Saphenofemoral Junction: No evidence of thrombus. Normal compressibility and flow on color Doppler imaging. Profunda Femoral Vein: No evidence of thrombus. Normal compressibility and flow on color Doppler imaging. Femoral Vein: No evidence of thrombus. Normal compressibility, respiratory phasicity and response to augmentation. Popliteal Vein: No evidence of thrombus. Normal compressibility, respiratory phasicity and response to augmentation. Calf Veins: No evidence of thrombus. Normal compressibility and flow on color Doppler imaging. Superficial Great Saphenous Vein: No evidence of thrombus. Normal compressibility. Venous Reflux:  None. Other Findings:  None. LEFT LOWER EXTREMITY Common Femoral Vein: No evidence of thrombus. Normal compressibility, respiratory phasicity and response to augmentation. Saphenofemoral Junction: No evidence of thrombus. Normal compressibility and flow on color Doppler imaging. Profunda Femoral Vein: No evidence of thrombus. Normal compressibility and flow on color Doppler imaging. Femoral Vein: No evidence of thrombus. Normal compressibility, respiratory phasicity and response to augmentation. Popliteal Vein: No evidence of thrombus. Normal compressibility, respiratory phasicity and response to augmentation. Calf Veins: No evidence of thrombus. Normal compressibility and flow on color Doppler imaging. Superficial Great Saphenous Vein: No evidence of thrombus. Normal compressibility. Venous Reflux:  None. Other Findings:  None. IMPRESSION: No evidence of deep venous thrombosis in either lower extremity. Electronically Signed   By: Jacqulynn Cadet M.D.   On: 03/14/2022 14:18   DG Chest Port 1 View  Result Date: 03/13/2022 CLINICAL DATA:  Chest pain, shortness of breath EXAM: PORTABLE  CHEST 1 VIEW COMPARISON:  01/09/2022 FINDINGS: Mild patchy/nodular right upper lobe opacities, raising the possibility of mild pneumonia. Left lung is essentially clear. No pleural effusion or pneumothorax. The heart is normal in size. IMPRESSION: Mild patchy/nodular right upper lobe opacities, raising the possibility of mild pneumonia. Electronically Signed   By: Julian Hy M.D.   On: 03/13/2022 03:19   DG Chest Portable 1 View  Result Date: 03/11/2022 CLINICAL DATA:  Shortness of breath EXAM: PORTABLE CHEST 1 VIEW COMPARISON:  Five days ago FINDINGS: Hyperinflation and architectural distortion from emphysema. Nodular density over the right chest is from rib callus by April 2023 CT. There is no edema, consolidation, effusion, or pneumothorax. Normal heart size and mediastinal contours. IMPRESSION: COPD without acute superimposed finding. Electronically Signed   By: Jorje Guild M.D.   On: 03/11/2022 05:22   DG Chest Port 1 View  Result Date: 02/27/2022 CLINICAL DATA:  Worsening shortness of breath.  History of COPD. EXAM: PORTABLE CHEST 1 VIEW COMPARISON:  02/20/2022 FINDINGS: 0827 hours. Lungs are hyperexpanded. Stable trace effusion versus scarring in the left costophrenic angle. Interstitial markings are diffusely coarsened with chronic features. The cardiopericardial silhouette is within normal limits for size. Telemetry leads overlie the chest. IMPRESSION: 1. Hyperexpansion without acute cardiopulmonary findings. Electronically Signed   By: Misty Stanley M.D.   On: 02/27/2022 08:36   DG CHEST PORT 1 VIEW  Result Date: 02/20/2022 CLINICAL DATA:  Chest pain, cough, shortness of breath. EXAM: PORTABLE CHEST  1 VIEW COMPARISON:  02/19/2022, 11/27/2021 and CT chest 02/14/2022. FINDINGS: Apical lordotic view. Trachea is midline. Heart size normal. Thoracic aorta is calcified. Lungs are hyperinflated but clear. Chronic blunting of left costophrenic angle. No pleural fluid. Old right posterior rib  fracture. IMPRESSION: Hyperinflation without acute finding. Electronically Signed   By: Lorin Picket M.D.   On: 02/20/2022 10:00   DG Chest Port 1 View  Result Date: 02/14/2022 CLINICAL DATA:  Shortness of breath with hemoptysis EXAM: PORTABLE CHEST 1 VIEW COMPARISON:  Eleven days ago FINDINGS: Hyperinflation and architectural distortion from emphysema. Interstitial prominence appears stable from prior. There is no edema, consolidation, effusion, or pneumothorax. Normal heart size and mediastinal contours. IMPRESSION: Baseline appearance of the chest.  Advanced emphysema. Electronically Signed   By: Jorje Guild M.D.   On: 02/14/2022 05:42   ECHOCARDIOGRAM COMPLETE  Result Date: 02/28/2022    ECHOCARDIOGRAM REPORT   Patient Name:   COLEBY YETT Persons Date of Exam: 02/28/2022 Medical Rec #:  [id number]      Height:       71.0 in Accession #:    [id number]     Weight:       140.7 lb Date of Birth:  1961/01/13       BSA:          1.816 m Patient Age:    61 years       BP:           142/79 mmHg Patient Gender: M              HR:           75 bpm. Exam Location:  [facility] Procedure: 2D Echo, Cardiac Doppler, Color Doppler and Strain Analysis Indications:    Chest Pain  History:        Patient has prior history of Echocardiogram examinations, most                 recent 10/27/2021. Risk Factors:Hypertension, Current Smoker and                 Dyslipidemia. Elevated troponin, Bipolar.  Sonographer:    Leavy Cella RDCS Referring Phys: [id number] DAVID TAT IMPRESSIONS  1. Left ventricular ejection fraction, by estimation, is 60 to 65%. The left ventricle has normal function. The left ventricle has no regional wall motion abnormalities. Left ventricular diastolic parameters are consistent with Grade I diastolic dysfunction (impaired relaxation).  2. Right ventricular systolic function is normal. The right ventricular size is normal. Tricuspid regurgitation signal is inadequate for assessing PA pressure.  3. The mitral  valve is normal in structure. No evidence of mitral valve regurgitation. No evidence of mitral stenosis.  4. The aortic valve was not well visualized. Aortic valve regurgitation is not visualized. No aortic stenosis is present.  5. The inferior vena cava is normal in size with greater than 50% respiratory variability, suggesting right atrial pressure of 3 mmHg. FINDINGS  Left Ventricle: Left ventricular ejection fraction, by estimation, is 60 to 65%. The left ventricle has normal function. The left ventricle has no regional wall motion abnormalities. Global longitudinal strain performed but not reported based on interpreter judgement due to suboptimal tracking. The left ventricular internal cavity size was normal in size. There is no left ventricular hypertrophy. Left ventricular diastolic parameters are consistent with Grade I diastolic dysfunction (impaired relaxation). Normal left ventricular filling pressure. Right Ventricle: The right ventricular size is normal. No increase in right ventricular wall thickness. Right  ventricular systolic function is normal. Tricuspid regurgitation signal is inadequate for assessing PA pressure. Left Atrium: Left atrial size was normal in size. Right Atrium: Right atrial size was normal in size. Pericardium: There is no evidence of pericardial effusion. Mitral Valve: The mitral valve is normal in structure. No evidence of mitral valve regurgitation. No evidence of mitral valve stenosis. Tricuspid Valve: The tricuspid valve is normal in structure. Tricuspid valve regurgitation is not demonstrated. No evidence of tricuspid stenosis. Aortic Valve: The aortic valve was not well visualized. Aortic valve regurgitation is not visualized. No aortic stenosis is present. Aortic valve mean gradient measures 5.0 mmHg. Aortic valve peak gradient measures 10.2 mmHg. Aortic valve area, by VTI measures 1.85 cm. Pulmonic Valve: The pulmonic valve was not well visualized. Pulmonic valve  regurgitation is not visualized. No evidence of pulmonic stenosis. Aorta: The aortic root is normal in size and structure. Venous: The inferior vena cava is normal in size with greater than 50% respiratory variability, suggesting right atrial pressure of 3 mmHg. IAS/Shunts: No atrial level shunt detected by color flow Doppler.  LEFT VENTRICLE PLAX 2D LVIDd:         4.90 cm   Diastology LVIDs:         3.00 cm   LV e' medial:    7.72 cm/s LV PW:         1.00 cm   LV E/e' medial:  11.8 LV IVS:        0.90 cm   LV e' lateral:   7.40 cm/s LVOT diam:     1.90 cm   LV E/e' lateral: 12.3 LV SV:         55 LV SV Index:   30 LVOT Area:     2.84 cm                           3D Volume EF:                          3D EF:        54 %                          LV EDV:       139 ml                          LV ESV:       64 ml                          LV SV:        75 ml RIGHT VENTRICLE RV Basal diam:  4.60 cm RV Mid diam:    3.30 cm RV S prime:     12.40 cm/s TAPSE (M-mode): 2.3 cm LEFT ATRIUM             Index        RIGHT ATRIUM          Index LA diam:        3.20 cm 1.76 cm/m   RA Area:     7.58 cm LA Vol (A2C):   38.7 ml 21.32 ml/m  RA Volume:   13.20 ml 7.27 ml/m LA Vol (A4C):   25.5 ml 14.05 ml/m LA Biplane Vol: 32.3 ml 17.79 ml/m  AORTIC VALVE AV Area (  Vmax):    2.04 cm AV Area (Vmean):   1.95 cm AV Area (VTI):     1.85 cm AV Vmax:           160.00 cm/s AV Vmean:          106.000 cm/s AV VTI:            0.297 m AV Peak Grad:      10.2 mmHg AV Mean Grad:      5.0 mmHg LVOT Vmax:         115.00 cm/s LVOT Vmean:        72.800 cm/s LVOT VTI:          0.194 m LVOT/AV VTI ratio: 0.65  AORTA Ao Root diam: 2.90 cm Ao Asc diam:  2.90 cm MITRAL VALVE MV Area (PHT): 2.83 cm     SHUNTS MV Decel Time: 268 msec     Systemic VTI:  0.19 m MV E velocity: 91.18 cm/s   Systemic Diam: 1.90 cm MV A velocity: 121.00 cm/s MV E/A ratio:  0.75 Carlyle Dolly MD Electronically signed by Carlyle Dolly MD Signature Date/Time:  02/28/2022/4:29:59 PM    Final      Discharge Exam: Vitals:   03/15/22 0554 03/15/22 0923  BP: 140/86   Pulse: 79   Resp: 19   Temp: 98.7 F (37.1 C)   SpO2: 100% 98%   Vitals:   03/14/22 2334 03/15/22 0258 03/15/22 0554 03/15/22 0923  BP: (!) 157/93  140/86   Pulse: 82  79   Resp:   19   Temp: 98.8 F (37.1 C)  98.7 F (37.1 C)   TempSrc: Oral  Oral   SpO2: 100% 98% 100% 98%  Weight:      Height:        General: Pt is alert, awake, not in acute distress Cardiovascular: RRR, S1/S2 +, no rubs, no gallops Respiratory: CTA bilaterally, no wheezing, no rhonchi, 4 L nasal cannula Abdominal: Soft, NT, ND, bowel sounds + Extremities: no edema, no cyanosis    The results of significant diagnostics from this hospitalization (including imaging, microbiology, ancillary and laboratory) are listed below for reference.     Microbiology: Recent Results (from the past 240 hour(s))  Respiratory (~20 pathogens) panel by PCR     Status: None   Collection Time: 03/13/22  8:04 AM   Specimen: Nasopharyngeal Swab; Respiratory  Result Value Ref Range Status   Adenovirus NOT DETECTED NOT DETECTED Final   Coronavirus 229E NOT DETECTED NOT DETECTED Final    Comment: (NOTE) The Coronavirus on the Respiratory Panel, DOES NOT test for the novel  Coronavirus (2019 nCoV)    Coronavirus HKU1 NOT DETECTED NOT DETECTED Final   Coronavirus NL63 NOT DETECTED NOT DETECTED Final   Coronavirus OC43 NOT DETECTED NOT DETECTED Final   Metapneumovirus NOT DETECTED NOT DETECTED Final   Rhinovirus / Enterovirus NOT DETECTED NOT DETECTED Final   Influenza A NOT DETECTED NOT DETECTED Final   Influenza B NOT DETECTED NOT DETECTED Final   Parainfluenza Virus 1 NOT DETECTED NOT DETECTED Final   Parainfluenza Virus 2 NOT DETECTED NOT DETECTED Final   Parainfluenza Virus 3 NOT DETECTED NOT DETECTED Final   Parainfluenza Virus 4 NOT DETECTED NOT DETECTED Final   Respiratory Syncytial Virus NOT DETECTED NOT  DETECTED Final   Bordetella pertussis NOT DETECTED NOT DETECTED Final   Bordetella Parapertussis NOT DETECTED NOT DETECTED Final   Chlamydophila pneumoniae NOT DETECTED NOT DETECTED Final   Mycoplasma  pneumoniae NOT DETECTED NOT DETECTED Final    Comment: Performed at [facility], [place] [address] [place], [place] [address]     Labs: BNP (last 3 results) Recent Labs    01/25/22 0540 02/19/22 1013 03/13/22 0312  BNP 83.0 72.0 36.1   Basic Metabolic Panel: Recent Labs  Lab 03/13/22 0312 03/14/22 0340 03/15/22 0342  NA 142 140 140  K 2.9* 3.5 3.3*  CL 98 99 101  CO2 36* 34* 32  GLUCOSE 96 126* 113*  BUN 17 17 11   CREATININE 0.83 0.88 0.80  CALCIUM 8.6* 8.4* 8.4*  MG  --  2.3  --    Liver Function Tests: No results for input(s): AST, ALT, ALKPHOS, BILITOT, PROT, ALBUMIN in the last 168 hours. No results for input(s): LIPASE, AMYLASE in the last 168 hours. No results for input(s): AMMONIA in the last 168 hours. CBC: Recent Labs  Lab 03/13/22 0312 03/14/22 0340 03/15/22 0342  WBC 15.1* 12.4* 12.0*  HGB 13.3 11.9* 12.2*  HCT 42.2 39.3 39.2  MCV 101.2* 101.0* 100.0  PLT 218 202 185   Cardiac Enzymes: No results for input(s): CKTOTAL, CKMB, CKMBINDEX, TROPONINI in the last 168 hours. BNP: Invalid input(s): POCBNP CBG: No results for input(s): GLUCAP in the last 168 hours. D-Dimer No results for input(s): DDIMER in the last 72 hours. Hgb A1c No results for input(s): HGBA1C in the last 72 hours. Lipid Profile No results for input(s): CHOL, HDL, LDLCALC, TRIG, CHOLHDL, LDLDIRECT in the last 72 hours. Thyroid function studies Recent Labs    03/14/22 0340  TSH 1.353   Anemia work up No results for input(s): VITAMINB12, FOLATE, FERRITIN, TIBC, IRON, RETICCTPCT in the last 72 hours. Urinalysis    Component Value Date/Time   COLORURINE YELLOW 10/16/2021 0130   APPEARANCEUR CLEAR 10/16/2021 0130   LABSPEC 1.020 10/16/2021 0130   PHURINE 6.0 10/16/2021  0130   GLUCOSEU NEGATIVE 10/16/2021 0130   HGBUR TRACE (A) 10/16/2021 0130   BILIRUBINUR NEGATIVE 10/16/2021 0130   KETONESUR NEGATIVE 10/16/2021 0130   PROTEINUR NEGATIVE 10/16/2021 0130   UROBILINOGEN 1.0 06/12/2015 1021   NITRITE NEGATIVE 10/16/2021 0130   LEUKOCYTESUR NEGATIVE 10/16/2021 0130   Sepsis Labs Invalid input(s): PROCALCITONIN,  WBC,  LACTICIDVEN Microbiology Recent Results (from the past 240 hour(s))  Respiratory (~20 pathogens) panel by PCR     Status: None   Collection Time: 03/13/22  8:04 AM   Specimen: Nasopharyngeal Swab; Respiratory  Result Value Ref Range Status   Adenovirus NOT DETECTED NOT DETECTED Final   Coronavirus 229E NOT DETECTED NOT DETECTED Final    Comment: (NOTE) The Coronavirus on the Respiratory Panel, DOES NOT test for the novel  Coronavirus (2019 nCoV)    Coronavirus HKU1 NOT DETECTED NOT DETECTED Final   Coronavirus NL63 NOT DETECTED NOT DETECTED Final   Coronavirus OC43 NOT DETECTED NOT DETECTED Final   Metapneumovirus NOT DETECTED NOT DETECTED Final   Rhinovirus / Enterovirus NOT DETECTED NOT DETECTED Final   Influenza A NOT DETECTED NOT DETECTED Final   Influenza B NOT DETECTED NOT DETECTED Final   Parainfluenza Virus 1 NOT DETECTED NOT DETECTED Final   Parainfluenza Virus 2 NOT DETECTED NOT DETECTED Final   Parainfluenza Virus 3 NOT DETECTED NOT DETECTED Final   Parainfluenza Virus 4 NOT DETECTED NOT DETECTED Final   Respiratory Syncytial Virus NOT DETECTED NOT DETECTED Final   Bordetella pertussis NOT DETECTED NOT DETECTED Final   Bordetella Parapertussis NOT DETECTED NOT DETECTED Final   Chlamydophila  pneumoniae NOT DETECTED NOT DETECTED Final   Mycoplasma pneumoniae NOT DETECTED NOT DETECTED Final    Comment: Performed at [facility], [place] [address], [place], [place] [address]     Time coordinating discharge: 35 minutes  SIGNED:   Rodena Goldmann, DO [facility] 03/15/2022, 11:05 AM  If 7PM-7AM, please  contact night-coverage [email or web address]

## 2022-03-15 NOTE — Telephone Encounter (Signed)
ATC patient. LVMTCB. 

## 2022-03-18 ENCOUNTER — Emergency Department (HOSPITAL_COMMUNITY): Payer: Medicaid Other

## 2022-03-18 ENCOUNTER — Emergency Department (HOSPITAL_COMMUNITY)
Admission: EM | Admit: 2022-03-18 | Discharge: 2022-03-19 | Disposition: A | Discharge status: Home / Self Care | Payer: Medicaid Other | Attending: Emergency Medicine | Admitting: Emergency Medicine

## 2022-03-18 ENCOUNTER — Encounter (HOSPITAL_COMMUNITY): Payer: Self-pay | Admitting: *Deleted

## 2022-03-18 ENCOUNTER — Other Ambulatory Visit: Payer: Self-pay

## 2022-03-18 DIAGNOSIS — J441 Chronic obstructive pulmonary disease with (acute) exacerbation: Secondary | ICD-10-CM | POA: Insufficient documentation

## 2022-03-18 DIAGNOSIS — R079 Chest pain, unspecified: Secondary | ICD-10-CM

## 2022-03-18 DIAGNOSIS — Z7901 Long term (current) use of anticoagulants: Secondary | ICD-10-CM | POA: Diagnosis not present

## 2022-03-18 DIAGNOSIS — Z7951 Long term (current) use of inhaled steroids: Secondary | ICD-10-CM | POA: Diagnosis not present

## 2022-03-18 LAB — CBC
HCT: 47.4 % (ref 39.0–52.0)
Hemoglobin: 14.9 g/dL (ref 13.0–17.0)
MCH: 31.6 pg (ref 26.0–34.0)
MCHC: 31.4 g/dL (ref 30.0–36.0)
MCV: 100.4 fL — ABNORMAL HIGH (ref 80.0–100.0)
Platelets: 211 10*3/uL (ref 150–400)
RBC: 4.72 MIL/uL (ref 4.22–5.81)
RDW: 14.8 % (ref 11.5–15.5)
WBC: 15.3 10*3/uL — ABNORMAL HIGH (ref 4.0–10.5)
nRBC: 0 % (ref 0.0–0.2)

## 2022-03-18 LAB — BASIC METABOLIC PANEL
Anion gap: 10 (ref 5–15)
BUN: 21 mg/dL — ABNORMAL HIGH (ref 6–20)
CO2: 33 mmol/L — ABNORMAL HIGH (ref 22–32)
Calcium: 9.2 mg/dL (ref 8.9–10.3)
Chloride: 99 mmol/L (ref 98–111)
Creatinine, Ser: 1.04 mg/dL (ref 0.61–1.24)
GFR, Estimated: >60 mL/min (ref >60)
Glucose, Bld: 105 mg/dL — ABNORMAL HIGH (ref 70–99)
Potassium: 4 mmol/L (ref 3.5–5.1)
Sodium: 142 mmol/L (ref 135–145)

## 2022-03-18 LAB — TROPONIN I (HIGH SENSITIVITY)
Troponin I (High Sensitivity): 15 ng/L (ref <18)
Troponin I (High Sensitivity): 15 ng/L (ref <18)

## 2022-03-18 IMAGING — DX DG CHEST 1V
2 series · 2 of 2 positions shown · non-contrast
Comparison: CT chest dated [DATE]

CLINICAL DATA: Chest and abdominal pain.

EXAM:
CHEST  1 VIEW

[chest ap (1 of 2)]
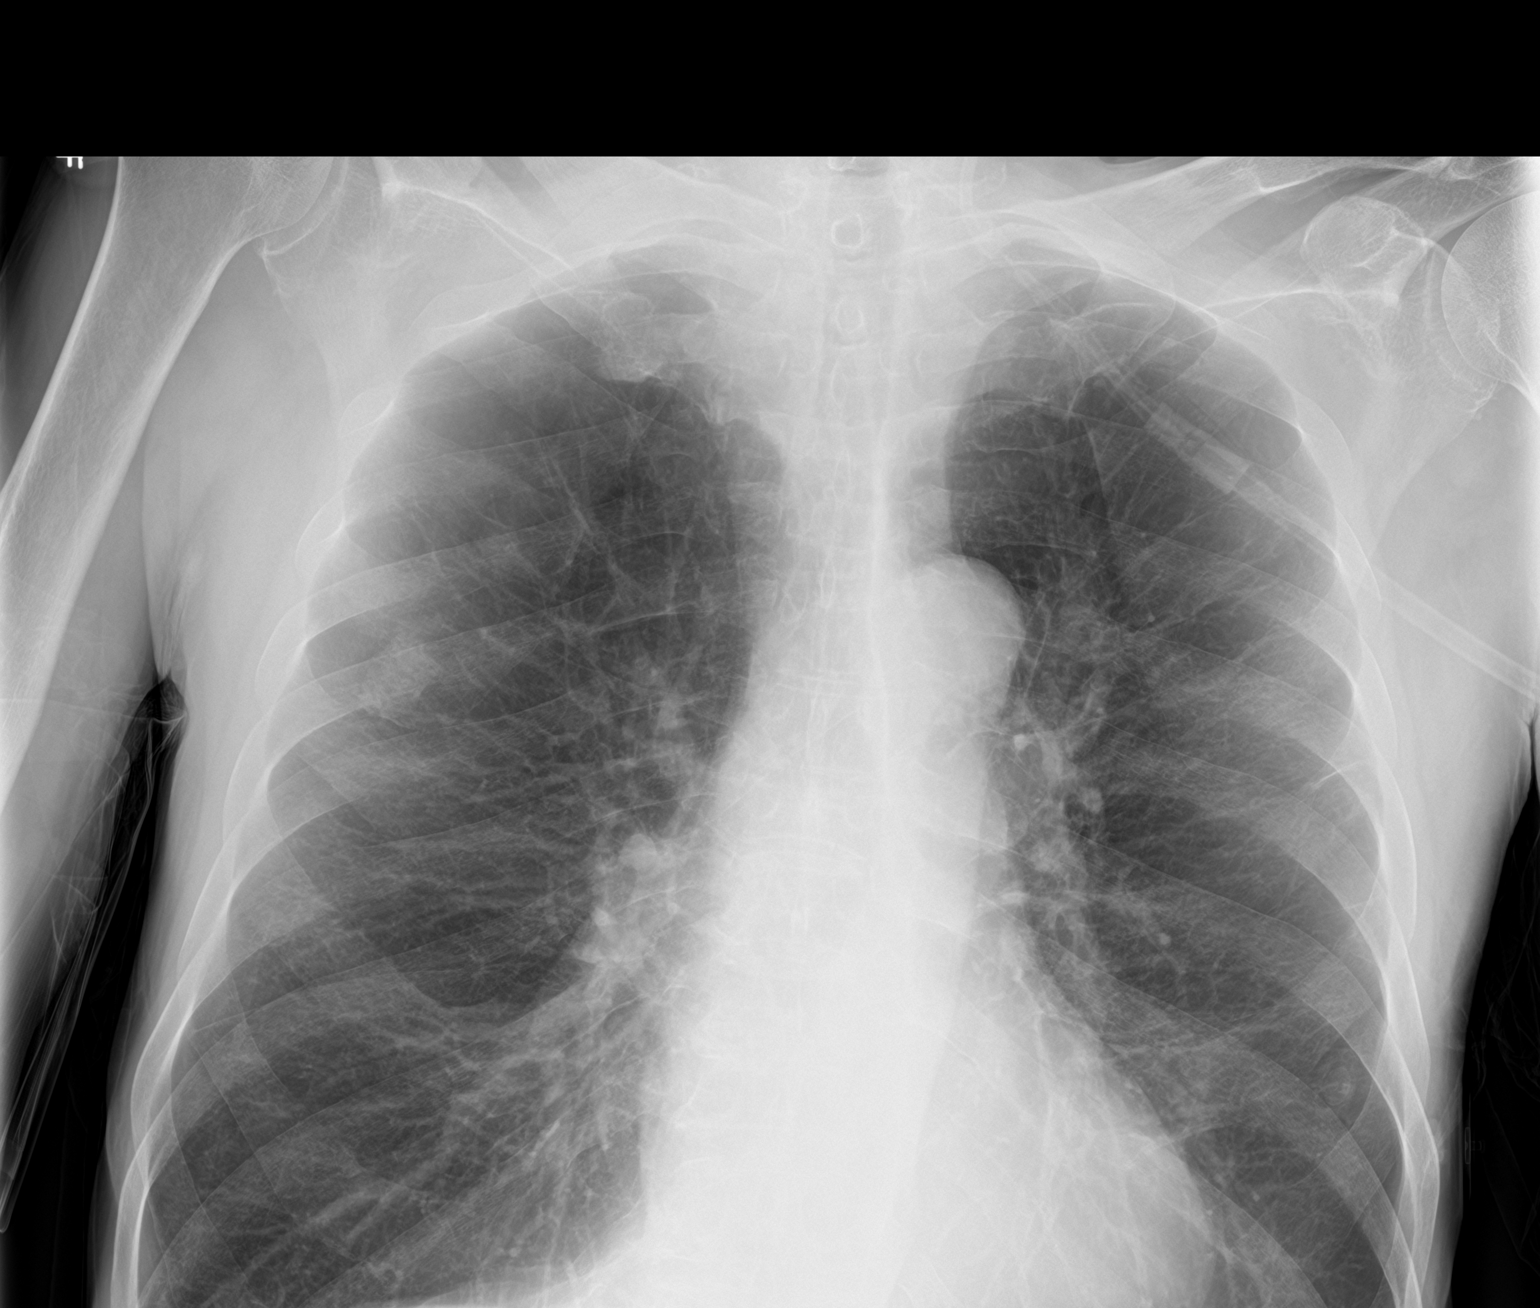

[chest ap (2 of 2)]
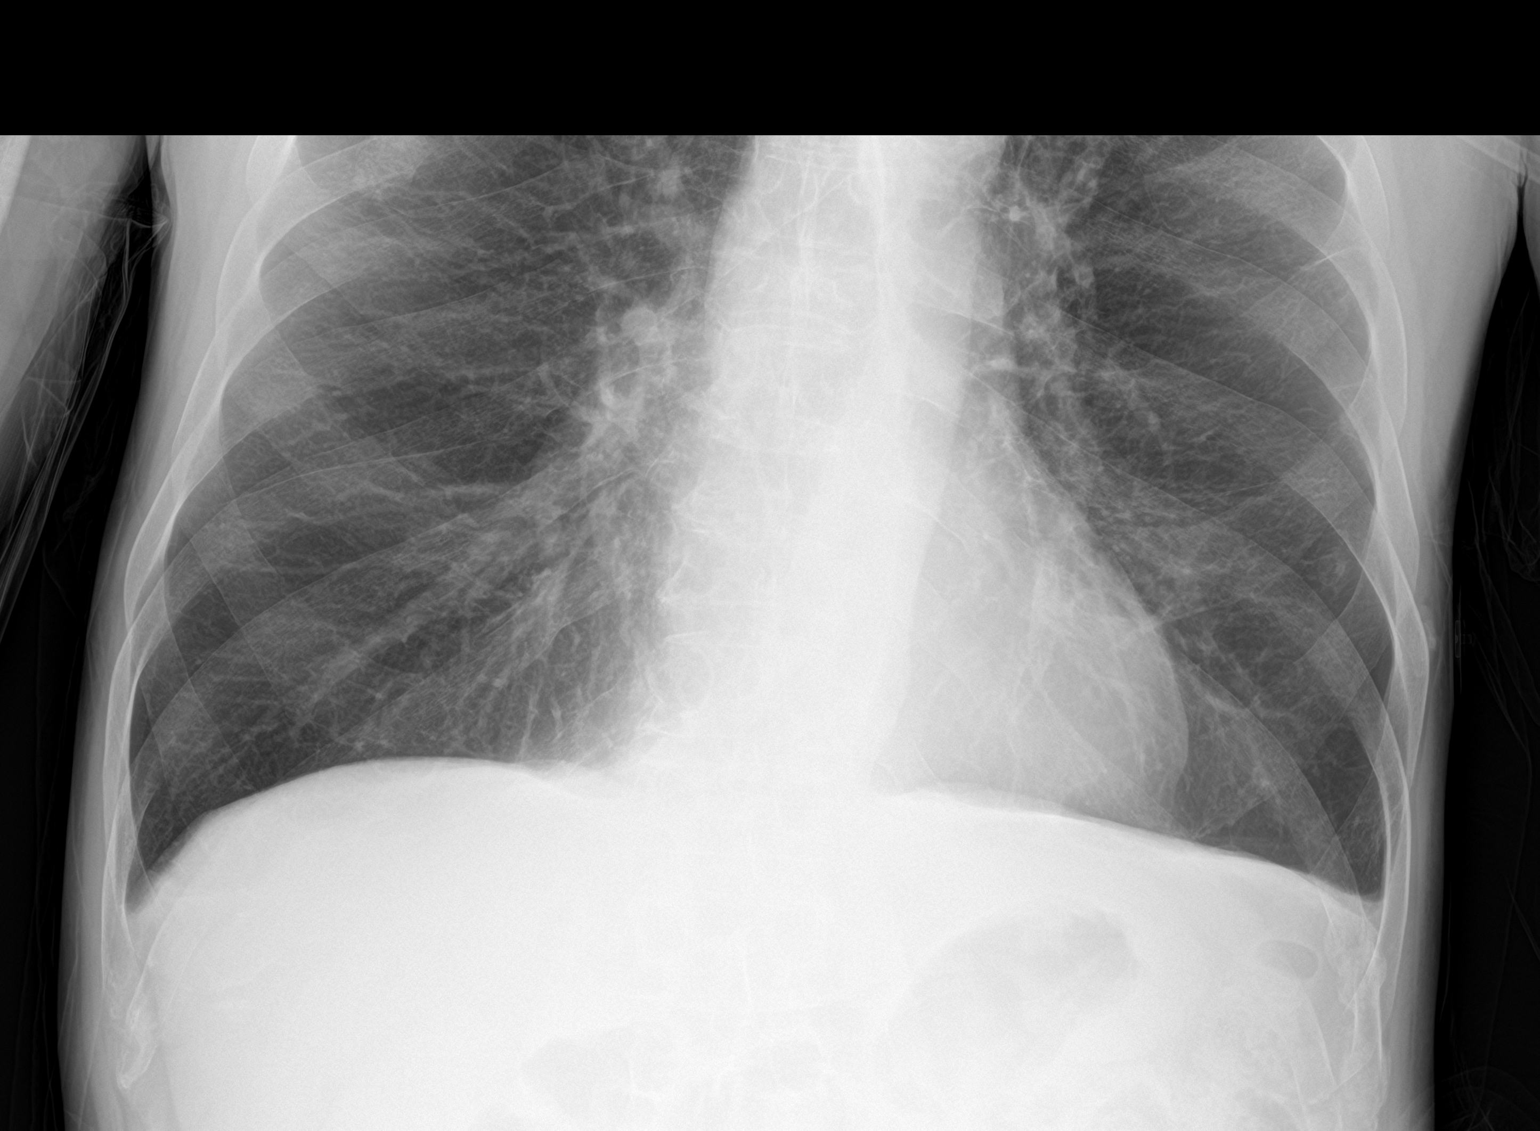

[2 of 2 positions shown; findings below may reference images not displayed]

FINDINGS: The heart size and mediastinal contours are within normal limits.
Emphysematous changes of the lungs. Focal consolidation or
appreciable pleural effusion. Callus formation of the prior right
posterior sixth rib fracture.
IMPRESSION: 1. Emphysematous changes without evidence of acute cardiopulmonary
process.

2.  Callus formation of the prior right sixth rib fracture.

## 2022-03-18 IMAGING — CT CT CHEST W/ CM
2 of 5 series · 15 of 36 positions shown, 18 images · IV contrast (Omnipaque or Isovue)
Comparison: [DATE]

CLINICAL DATA: Respiratory illness, nondiagnostic xray. Chest pain.

EXAM:
CT CHEST WITH CONTRAST
TECHNIQUE: Multidetector CT imaging of the chest was performed during
intravenous contrast administration.

[Series 3: thins · axial · 0.73mm/px · z∈[-295,+5]mm · 12 of 530 slices shown, 15 images]
[im 34/530  mediastinal]
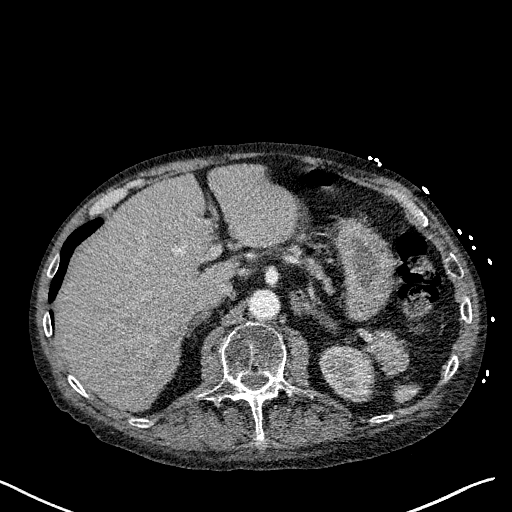
[im 34/530  lung]
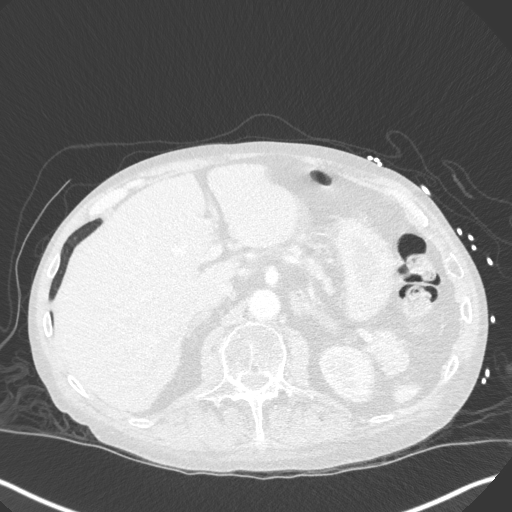
[im 67/530  lung]
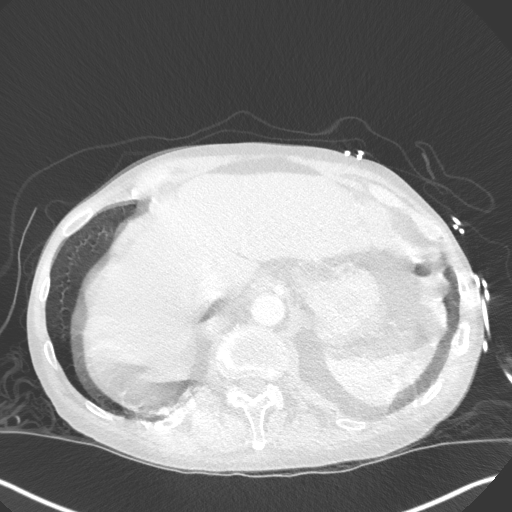
[im 133/530  lung]
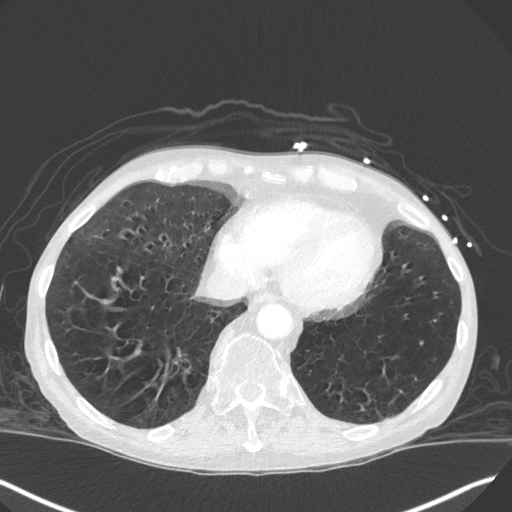
[im 166/530  lung]
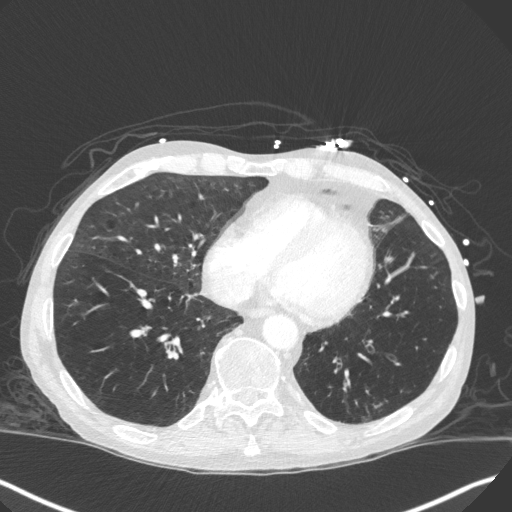
[im 199/530  mediastinal]
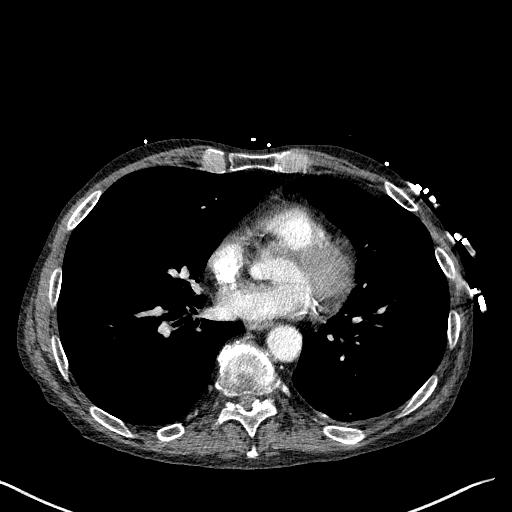
[im 199/530  lung]
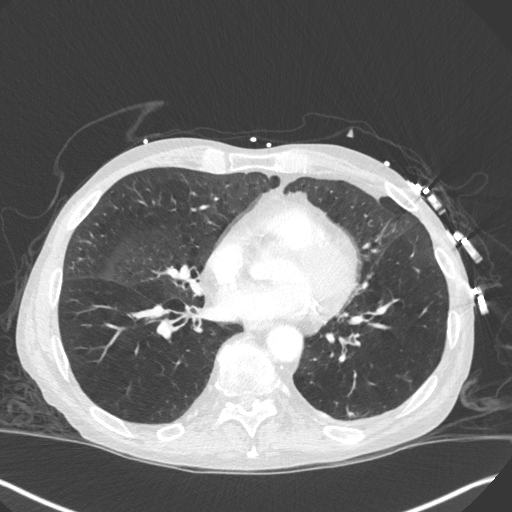
[im 232/530  lung]
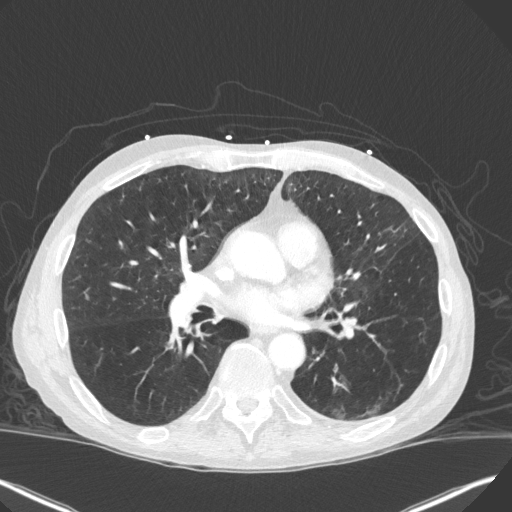
[im 298/530  lung]
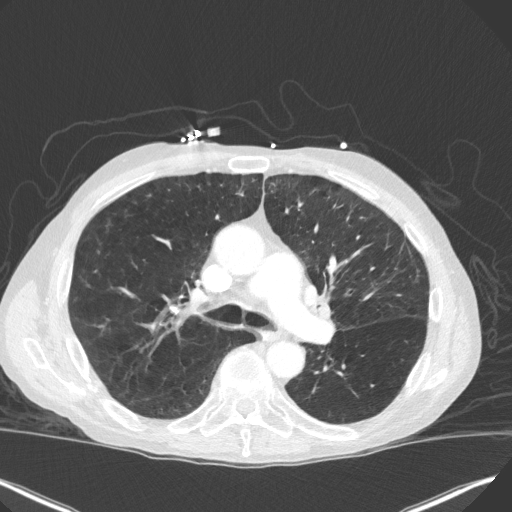
[im 331/530  lung]
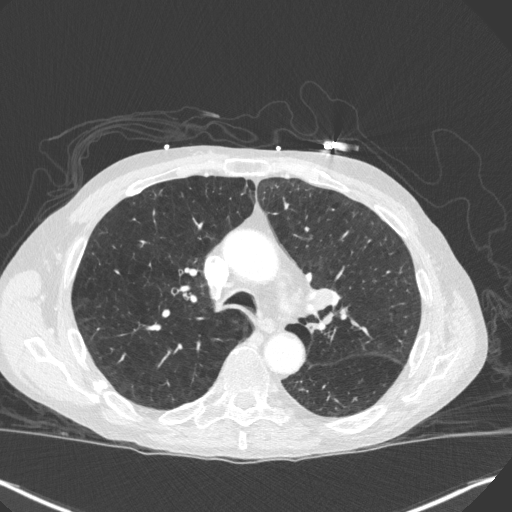
[im 364/530  mediastinal]
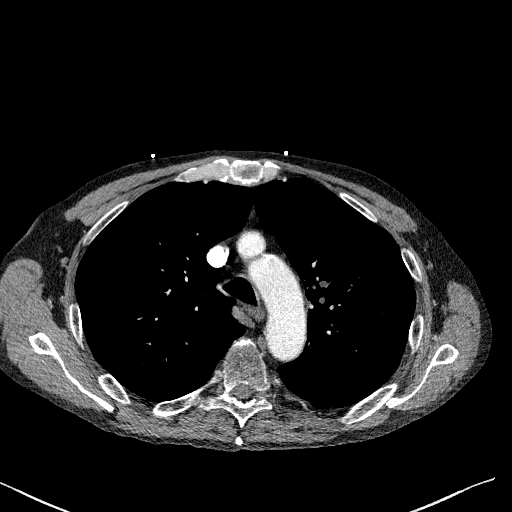
[im 364/530  lung]
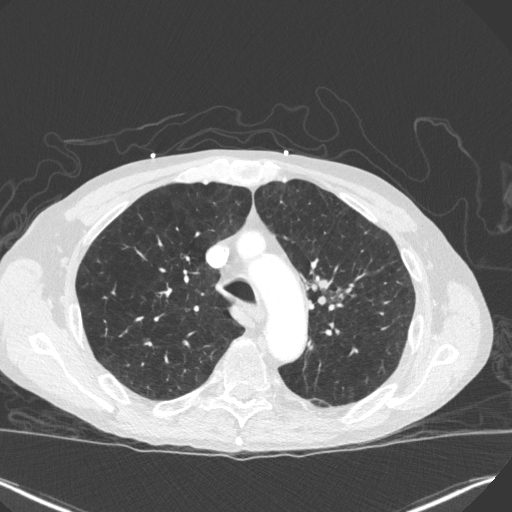
[im 397/530  lung]
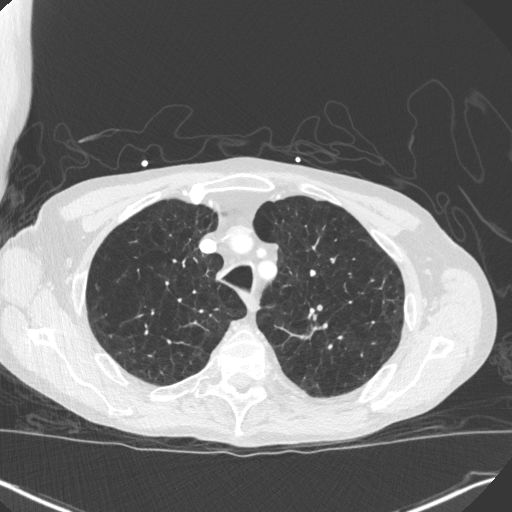
[im 463/530  lung]
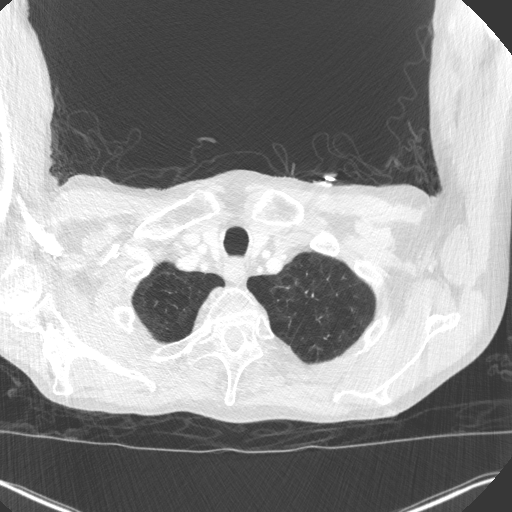
[im 496/530  lung]
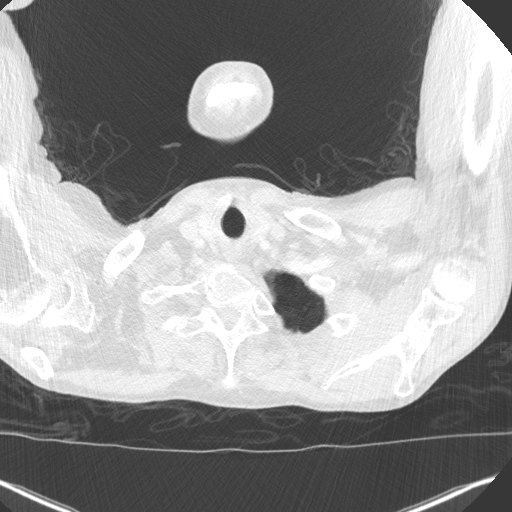

[Series 6: coronal · coronal · 0.70mm/px · 3 of 116 slices shown]
[im 24/116  lung]
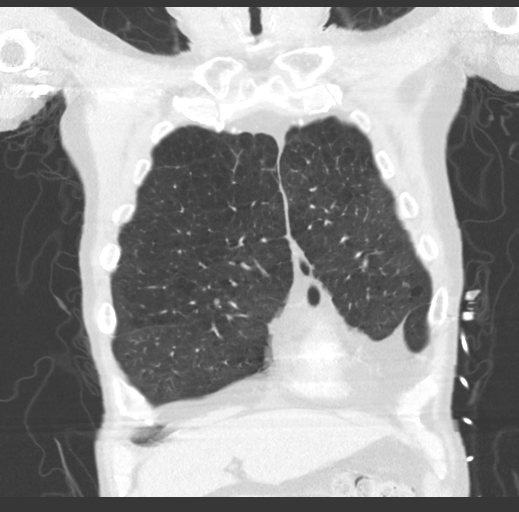
[im 47/116  lung]
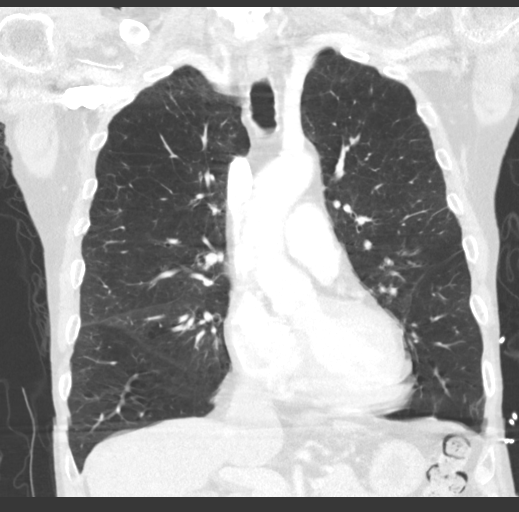
[im 70/116  lung]
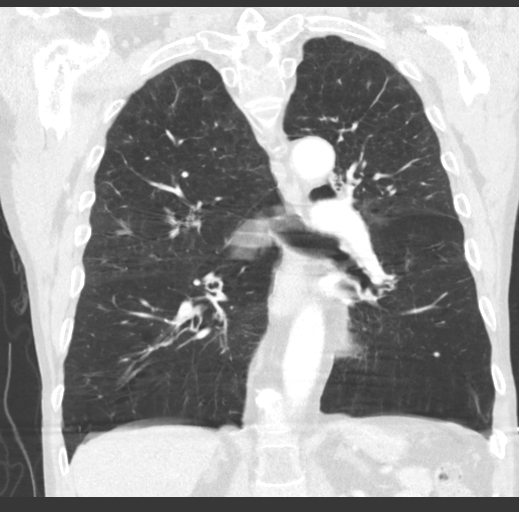

[15 of 36 positions shown; findings below may reference images not displayed]

RADIATION DOSE REDUCTION: This exam was performed according to the
departmental dose-optimization program which includes automated
exposure control, adjustment of the mA and/or kV according to
patient size and/or use of iterative reconstruction technique.

CONTRAST:  75mL OMNIPAQUE IOHEXOL 300 MG/ML  SOLN
FINDINGS: Cardiovascular: Moderate multi-vessel coronary artery calcification.
Global cardiac size within normal limits. No pericardial effusion.
Central pulmonary arteries are enlarged in keeping with changes of
pulmonary arterial hypertension. Mild atherosclerotic calcification
within the thoracic aorta. No aortic aneurysm.

Mediastinum/Nodes: No enlarged mediastinal, hilar, or axillary lymph
nodes. Thyroid gland, trachea, and esophagus demonstrate no
significant findings.

Lungs/Pleura: Severe emphysema. Superimposed airway inflammation
again noted with bronchial wall thickening. Airway impaction within
the lower lobes has improved in the interval since prior
examination. Endobronchial mass is again identified within the left
upper lobe are pulmonary bronchus extending peripherally into
multiple segmental and subsegmental bronchi of the left upper lobe.
No pneumothorax or pleural effusion.

Upper Abdomen: No acute abnormality.

Musculoskeletal: Stable mild compression deformities of T5, T8,
T9-T10, T11, and L1. No acute bone abnormality.
IMPRESSION: Severe emphysema. Superimposed airway inflammation. Improved airway
impaction within the lower lobes bilaterally.

Stable endobronchial mass within the a left upper lobar pulmonary
bronchus with extension to multiple peripheral segmental bronchi.
Correlation with endoscopy is recommended.

Moderate multi-vessel coronary artery calcification.

Morphologic changes in keeping with pulmonary arterial hypertension.

Numerous thoracic compression deformities, stable since prior
examination.

Aortic Atherosclerosis ([IK]-[IK]) and Emphysema ([IK]-[IK]).

## 2022-03-18 MED ORDER — IOHEXOL 300 MG/ML  SOLN
75.0000 mL | Freq: Once | INTRAMUSCULAR | Status: AC | PRN
  Administered 2022-03-18: 75 mL via INTRAVENOUS

## 2022-03-18 MED ORDER — IPRATROPIUM-ALBUTEROL 0.5-2.5 (3) MG/3ML IN SOLN
3.0000 mL | Freq: Once | RESPIRATORY_TRACT | Status: AC
  Administered 2022-03-18: 3 mL via RESPIRATORY_TRACT

## 2022-03-18 MED ORDER — IPRATROPIUM-ALBUTEROL 0.5-2.5 (3) MG/3ML IN SOLN
3.0000 mL | RESPIRATORY_TRACT | Status: AC
  Administered 2022-03-18 (×2): 3 mL via RESPIRATORY_TRACT
  Filled 2022-03-18: qty 6

## 2022-03-18 MED ORDER — IPRATROPIUM-ALBUTEROL 0.5-2.5 (3) MG/3ML IN SOLN
RESPIRATORY_TRACT | Status: AC
  Filled 2022-03-18: qty 3

## 2022-03-18 MED ORDER — METHYLPREDNISOLONE SODIUM SUCC 125 MG IJ SOLR
125.0000 mg | Freq: Once | INTRAMUSCULAR | Status: AC
  Administered 2022-03-18: 125 mg via INTRAVENOUS
  Filled 2022-03-18: qty 2

## 2022-03-18 NOTE — ED Triage Notes (Signed)
Pt c/o chest pain and abdominal pain when he started taking eliquis a few days ago; pt c/o abdominal swelling and bilateral feet swelling

## 2022-03-18 NOTE — ED Provider Notes (Signed)
[facility] EMERGENCY DEPARTMENT Provider Note   CSN: [id number] Arrival date & time: 03/18/22  1600     History  Chief Complaint  Patient presents with   Chest Pain    Tony Sullivan is a 61 y.o. male.  Patient is a 61 year old male with past medical history of COPD presenting for complaints of shortness of breath.  Patient admits to shortness of breath, difficulty taking deep breath, and wheezing over the past several days.  Denies any fevers, chills, or coughing.  Denies chest pain.  Denies lower extremity swelling.  No current tobacco use.  The history is provided by the patient. No language interpreter was used.  Chest Pain Associated symptoms: shortness of breath   Associated symptoms: no abdominal pain, no back pain, no cough, no fever, no palpitations and no vomiting       Home Medications Prior to Admission medications   Medication Sig Start Date End Date Taking? Authorizing Provider  acetaminophen (TYLENOL) 325 MG tablet Take 2 tablets (650 mg total) by mouth every 6 (six) hours as needed for mild pain (or Fever >/= 101). 10/30/21   [name], MD  albuterol (PROVENTIL) (2.5 MG/3ML) 0.083% nebulizer solution Take 3 mLs (2.5 mg total) by nebulization every 4 (four) hours as needed for wheezing or shortness of breath. 12/12/21 12/12/22  [name], MD  albuterol (VENTOLIN HFA) 108 (90 Base) MCG/ACT inhaler Inhale 2 puffs into the lungs every 6 (six) hours as needed for wheezing or shortness of breath. 03/15/22   [name], Pratik D, DO  ALPRAZolam Duanne Moron) 0.5 MG tablet Take 1 tablet (0.5 mg total) by mouth 3 (three) times daily as needed for up to 5 days for anxiety. 03/25/22 03/30/22  Shahmehdi, Valeria Batman, MD  apixaban (ELIQUIS) 5 MG TABS tablet Take 1 tablet (5 mg total) by mouth 2 (two) times daily. 03/22/22 05/21/22  [name], Pratik D, DO  atorvastatin (LIPITOR) 40 MG tablet TAKE ONE TABLET BY MOUTH DAILY Patient taking differently: Take 40 mg by mouth daily. 07/06/20   [name], MD  benztropine (COGENTIN) 0.5 MG tablet Take 0.5 mg by mouth at bedtime.    [provider]  Budeson-Glycopyrrol-Formoterol (BREZTRI AEROSPHERE) 160-9-4.8 MCG/ACT AERO Inhale 2 puffs into the lungs in the morning and at bedtime. 12/12/21   [name], MD  diclofenac Sodium (VOLTAREN) 1 % GEL Apply 2 g topically 4 (four) times daily as needed (pain).    [provider]  dorzolamide (TRUSOPT) 2 % ophthalmic solution Place 1 drop into the left eye 2 (two) times daily. 12/12/20   [provider]  doxycycline (VIBRA-TABS) 100 MG tablet Take 1 tablet (100 mg total) by mouth every 12 (twelve) hours for 5 days. 03/25/22 03/30/22  Shahmehdi, Valeria Batman, MD  gabapentin (NEURONTIN) 100 MG capsule Take 1 capsule (100 mg total) by mouth 3 (three) times daily. 03/25/22 05/24/22  Deatra James, MD  guaiFENesin 200 MG tablet Take 2 tablets (400 mg total) by mouth every 6 (six) hours as needed for cough or to loosen phlegm. 03/02/22   Orson Eva, MD  latanoprost (XALATAN) 0.005 % ophthalmic solution Place 1 drop into both eyes at bedtime. 05/24/19   [provider]  losartan (COZAAR) 100 MG tablet Take 100 mg by mouth daily.    [provider]  loxapine (LOXITANE) 10 MG capsule Take 10 mg by mouth at bedtime.     [provider]  methylPREDNISolone (MEDROL DOSEPAK) 4 MG TBPK tablet  Medrol Dosepak take as instructed 03/25/22   Deatra James, MD  mirtazapine (REMERON) 30 MG tablet Take 1 tablet (30 mg total) by mouth at bedtime. 10/30/21   [name], MD  Multiple Vitamin (MULTIVITAMIN WITH MINERALS) TABS tablet Take 1 tablet by mouth daily. 10/31/21   [name], MD  OXYGEN Inhale 3-4 L into the lungs as directed. 3-4 liters with sleep and exertion as needed for low oxygen    [provider]  pantoprazole (PROTONIX) 40 MG tablet Take 1 tablet (40 mg total) by mouth daily. 10/30/21   [name], MD  polyethylene glycol (MIRALAX /  GLYCOLAX) 17 g packet TAKE 17 GRAMS BY MOUTH DAILY Patient taking differently: Take 17 g by mouth daily as needed for mild constipation. 03/09/20   [name], MD  senna-docusate (SENOKOT-S) 8.6-50 MG tablet Take 2 tablets by mouth at bedtime. Patient taking differently: Take 2 tablets by mouth at bedtime as needed for mild constipation. 10/30/21   [name], MD  simethicone (MYLICON) 80 MG chewable tablet Chew 1 tablet (80 mg total) by mouth 4 (four) times daily as needed for flatulence. 03/15/22   [name], Pratik D, DO  zolpidem (AMBIEN) 5 MG tablet Take 1 tablet (5 mg total) by mouth at bedtime as needed for up to 20 days for sleep. 03/25/22 04/14/22  Deatra James, MD      Allergies    Penicillins and Levocetirizine    Review of Systems   Review of Systems  Constitutional:  Negative for chills and fever.  HENT:  Negative for ear pain and sore throat.   Eyes:  Negative for pain and visual disturbance.  Respiratory:  Positive for chest tightness and shortness of breath. Negative for cough.   Cardiovascular:  Negative for chest pain and palpitations.  Gastrointestinal:  Negative for abdominal pain and vomiting.  Genitourinary:  Negative for dysuria and hematuria.  Musculoskeletal:  Negative for arthralgias and back pain.  Skin:  Negative for color change and rash.  Neurological:  Negative for seizures and syncope.  All other systems reviewed and are negative.  Physical Exam Updated Vital Signs BP (!) 130/92   Pulse 100   Temp 97.8 F (36.6 C) (Oral)   Resp (!) 21   Ht 5\' 11"  (1.803 m)   Wt 61.2 kg   SpO2 100%   BMI 18.82 kg/m  Physical Exam Vitals and nursing note reviewed.  Constitutional:      General: He is not in acute distress.    Appearance: He is well-developed.  HENT:     Head: Normocephalic and atraumatic.  Eyes:     Conjunctiva/sclera: Conjunctivae normal.  Cardiovascular:     Rate and Rhythm: Normal rate and regular rhythm.     Heart sounds: No murmur  heard. Pulmonary:     Effort: Tachypnea and respiratory distress present.     Breath sounds: Normal breath sounds. Decreased air movement present.     Comments: Speaking in 1-2 word sentences given.   Abdominal:     Palpations: Abdomen is soft.     Tenderness: There is no abdominal tenderness.  Musculoskeletal:        General: No swelling.     Cervical back: Neck supple.  Skin:    General: Skin is warm and dry.     Capillary Refill: Capillary refill takes less than 2 seconds.  Neurological:     Mental Status: He is alert.  Psychiatric:  Mood and Affect: Mood normal.    ED Results / Procedures / Treatments   Labs (all labs ordered are listed, but only abnormal results are displayed) Labs Reviewed  BASIC METABOLIC PANEL - Abnormal; Notable for the following components:      Result Value   CO2 33 (*)    Glucose, Bld 105 (*)    BUN 21 (*)    All other components within normal limits  CBC - Abnormal; Notable for the following components:   WBC 15.3 (*)    MCV 100.4 (*)    All other components within normal limits  TROPONIN I (HIGH SENSITIVITY)  TROPONIN I (HIGH SENSITIVITY)    EKG EKG Interpretation  Date/Time:  Monday Mar 18 2022 20:11:24 EDT Ventricular Rate:  89 PR Interval:  137 QRS Duration: 82 QT Interval:  365 QTC Calculation: 445 R Axis:   -86 Text Interpretation: Sinus rhythm Biatrial enlargement Left anterior fascicular block Confirmed by Campbell Stall ([id number]) on 1/76/1607 8:42:52 PM  Radiology No results found.  Procedures Procedures    Medications Ordered in ED Medications  ipratropium-albuterol (DUONEB) 0.5-2.5 (3) MG/3ML nebulizer solution 3 mL (3 mLs Nebulization Given 03/18/22 2013)  methylPREDNISolone sodium succinate (SOLU-MEDROL) 125 mg/2 mL injection 125 mg (125 mg Intravenous Given 03/18/22 2132)  ipratropium-albuterol (DUONEB) 0.5-2.5 (3) MG/3ML nebulizer solution 3 mL (3 mLs Nebulization Given 03/18/22 2142)  iohexol (OMNIPAQUE) 300  MG/ML solution 75 mL (75 mLs Intravenous Contrast Given 03/18/22 2335)    ED Course/ Medical Decision Making/ A&P                           Medical Decision Making Amount and/or Complexity of Data Reviewed Labs: ordered. Radiology: ordered.  Risk Prescription drug management.   61 year old male with past medical history of COPD presenting for complaints of shortness of breath.  Patient is alert and oriented x3, acute respiratory distress, tachypneic, speaking in 1-2 word sentences, with decreased air movement in all lung [place].  I independently interpreted patient's labs and EKG.  EKG demonstrates no ST segment elevation or depression.  Troponin, electrolytes, and chest x-ray demonstrates no acute process.  Concerns for COPD exacerbation.  Solu-Medrol and DuoNebs x3 given. Patient has leukocytosis with nondiagnostic x-ray.  CT chest ordered.  Pt signed out to oncoming physician while awaiting imaging.           Final Clinical Impression(s) / ED Diagnoses Final diagnoses:  Nonspecific chest pain  COPD exacerbation [facility])    Rx / DC Orders ED Discharge Orders     None         Lianne Cure, DO 37/10/62 2350

## 2022-03-18 NOTE — ED Notes (Signed)
Pt given new O2 tank

## 2022-03-19 ENCOUNTER — Emergency Department (HOSPITAL_COMMUNITY): Payer: Medicaid Other

## 2022-03-19 ENCOUNTER — Encounter (HOSPITAL_COMMUNITY): Payer: Self-pay | Admitting: Emergency Medicine

## 2022-03-19 ENCOUNTER — Inpatient Hospital Stay (HOSPITAL_COMMUNITY)
Admission: EM | Admit: 2022-03-19 | Discharge: 2022-03-25 | DRG: 190 | Disposition: A | Discharge status: Home Health | Payer: Medicaid Other | Attending: Family Medicine | Admitting: Family Medicine

## 2022-03-19 ENCOUNTER — Other Ambulatory Visit: Payer: Self-pay

## 2022-03-19 DIAGNOSIS — C3492 Malignant neoplasm of unspecified part of left bronchus or lung: Secondary | ICD-10-CM

## 2022-03-19 DIAGNOSIS — I2699 Other pulmonary embolism without acute cor pulmonale: Secondary | ICD-10-CM | POA: Diagnosis present

## 2022-03-19 DIAGNOSIS — M4854XD Collapsed vertebra, not elsewhere classified, thoracic region, subsequent encounter for fracture with routine healing: Secondary | ICD-10-CM | POA: Diagnosis present

## 2022-03-19 DIAGNOSIS — J9621 Acute and chronic respiratory failure with hypoxia: Secondary | ICD-10-CM | POA: Diagnosis present

## 2022-03-19 DIAGNOSIS — J441 Chronic obstructive pulmonary disease with (acute) exacerbation: Principal | ICD-10-CM

## 2022-03-19 DIAGNOSIS — Z809 Family history of malignant neoplasm, unspecified: Secondary | ICD-10-CM

## 2022-03-19 DIAGNOSIS — C3412 Malignant neoplasm of upper lobe, left bronchus or lung: Secondary | ICD-10-CM | POA: Diagnosis present

## 2022-03-19 DIAGNOSIS — X58XXXD Exposure to other specified factors, subsequent encounter: Secondary | ICD-10-CM | POA: Diagnosis present

## 2022-03-19 DIAGNOSIS — K219 Gastro-esophageal reflux disease without esophagitis: Secondary | ICD-10-CM | POA: Diagnosis present

## 2022-03-19 DIAGNOSIS — Z716 Tobacco abuse counseling: Secondary | ICD-10-CM

## 2022-03-19 DIAGNOSIS — R042 Hemoptysis: Secondary | ICD-10-CM | POA: Diagnosis present

## 2022-03-19 DIAGNOSIS — R5381 Other malaise: Secondary | ICD-10-CM | POA: Diagnosis present

## 2022-03-19 DIAGNOSIS — E782 Mixed hyperlipidemia: Secondary | ICD-10-CM | POA: Diagnosis present

## 2022-03-19 DIAGNOSIS — J9622 Acute and chronic respiratory failure with hypercapnia: Secondary | ICD-10-CM | POA: Diagnosis present

## 2022-03-19 DIAGNOSIS — Z888 Allergy status to other drugs, medicaments and biological substances status: Secondary | ICD-10-CM

## 2022-03-19 DIAGNOSIS — J439 Emphysema, unspecified: Principal | ICD-10-CM | POA: Diagnosis present

## 2022-03-19 DIAGNOSIS — Z9981 Dependence on supplemental oxygen: Secondary | ICD-10-CM

## 2022-03-19 DIAGNOSIS — Z832 Family history of diseases of the blood and blood-forming organs and certain disorders involving the immune mechanism: Secondary | ICD-10-CM

## 2022-03-19 DIAGNOSIS — F17210 Nicotine dependence, cigarettes, uncomplicated: Secondary | ICD-10-CM | POA: Diagnosis present

## 2022-03-19 DIAGNOSIS — R918 Other nonspecific abnormal finding of lung field: Secondary | ICD-10-CM

## 2022-03-19 DIAGNOSIS — F319 Bipolar disorder, unspecified: Secondary | ICD-10-CM | POA: Diagnosis present

## 2022-03-19 DIAGNOSIS — F419 Anxiety disorder, unspecified: Secondary | ICD-10-CM | POA: Diagnosis present

## 2022-03-19 DIAGNOSIS — S22000A Wedge compression fracture of unspecified thoracic vertebra, initial encounter for closed fracture: Secondary | ICD-10-CM

## 2022-03-19 DIAGNOSIS — Z7952 Long term (current) use of systemic steroids: Secondary | ICD-10-CM

## 2022-03-19 DIAGNOSIS — Z981 Arthrodesis status: Secondary | ICD-10-CM

## 2022-03-19 DIAGNOSIS — Z8249 Family history of ischemic heart disease and other diseases of the circulatory system: Secondary | ICD-10-CM

## 2022-03-19 DIAGNOSIS — Z825 Family history of asthma and other chronic lower respiratory diseases: Secondary | ICD-10-CM

## 2022-03-19 DIAGNOSIS — Z833 Family history of diabetes mellitus: Secondary | ICD-10-CM

## 2022-03-19 DIAGNOSIS — R7303 Prediabetes: Secondary | ICD-10-CM | POA: Diagnosis present

## 2022-03-19 DIAGNOSIS — R718 Other abnormality of red blood cells: Secondary | ICD-10-CM | POA: Diagnosis present

## 2022-03-19 DIAGNOSIS — L89152 Pressure ulcer of sacral region, stage 2: Secondary | ICD-10-CM | POA: Diagnosis present

## 2022-03-19 DIAGNOSIS — Z720 Tobacco use: Secondary | ICD-10-CM | POA: Diagnosis present

## 2022-03-19 DIAGNOSIS — Z880 Allergy status to penicillin: Secondary | ICD-10-CM

## 2022-03-19 DIAGNOSIS — Z20822 Contact with and (suspected) exposure to covid-19: Secondary | ICD-10-CM | POA: Diagnosis present

## 2022-03-19 DIAGNOSIS — Z7901 Long term (current) use of anticoagulants: Secondary | ICD-10-CM

## 2022-03-19 DIAGNOSIS — Z993 Dependence on wheelchair: Secondary | ICD-10-CM

## 2022-03-19 DIAGNOSIS — I10 Essential (primary) hypertension: Secondary | ICD-10-CM | POA: Diagnosis present

## 2022-03-19 LAB — BASIC METABOLIC PANEL
Anion gap: 9 (ref 5–15)
BUN: 27 mg/dL — ABNORMAL HIGH (ref 6–20)
CO2: 31 mmol/L (ref 22–32)
Calcium: 8.9 mg/dL (ref 8.9–10.3)
Chloride: 100 mmol/L (ref 98–111)
Creatinine, Ser: 1.17 mg/dL (ref 0.61–1.24)
GFR, Estimated: >60 mL/min (ref >60)
Glucose, Bld: 146 mg/dL — ABNORMAL HIGH (ref 70–99)
Potassium: 3.5 mmol/L (ref 3.5–5.1)
Sodium: 140 mmol/L (ref 135–145)

## 2022-03-19 LAB — CBC WITH DIFFERENTIAL/PLATELET
Abs Immature Granulocytes: 0.33 10*3/uL — ABNORMAL HIGH (ref 0.00–0.07)
Basophils Absolute: 0.1 10*3/uL (ref 0.0–0.1)
Basophils Relative: 1 %
Eosinophils Absolute: 0 10*3/uL (ref 0.0–0.5)
Eosinophils Relative: 0 %
HCT: 43.9 % (ref 39.0–52.0)
Hemoglobin: 13.8 g/dL (ref 13.0–17.0)
Immature Granulocytes: 3 %
Lymphocytes Relative: 4 %
Lymphs Abs: 0.6 10*3/uL — ABNORMAL LOW (ref 0.7–4.0)
MCH: 31.7 pg (ref 26.0–34.0)
MCHC: 31.4 g/dL (ref 30.0–36.0)
MCV: 100.7 fL — ABNORMAL HIGH (ref 80.0–100.0)
Monocytes Absolute: 0.8 10*3/uL (ref 0.1–1.0)
Monocytes Relative: 6 %
Neutro Abs: 11.4 10*3/uL — ABNORMAL HIGH (ref 1.7–7.7)
Neutrophils Relative %: 86 %
Platelets: 198 10*3/uL (ref 150–400)
RBC: 4.36 MIL/uL (ref 4.22–5.81)
RDW: 15.1 % (ref 11.5–15.5)
WBC: 13.2 10*3/uL — ABNORMAL HIGH (ref 4.0–10.5)
nRBC: 0 % (ref 0.0–0.2)

## 2022-03-19 LAB — MAGNESIUM: Magnesium: 2.5 mg/dL — ABNORMAL HIGH (ref 1.7–2.4)

## 2022-03-19 LAB — PROTIME-INR
INR: 1.2 (ref 0.8–1.2)
Prothrombin Time: 14.7 seconds (ref 11.4–15.2)

## 2022-03-19 IMAGING — DX DG CHEST 1V PORT
2 series · 2 of 2 positions shown · non-contrast
Comparison: [DATE], CT [DATE]

CLINICAL DATA: Hemoptysis

EXAM:
PORTABLE CHEST 1 VIEW

[chest ap (1 of 2)]
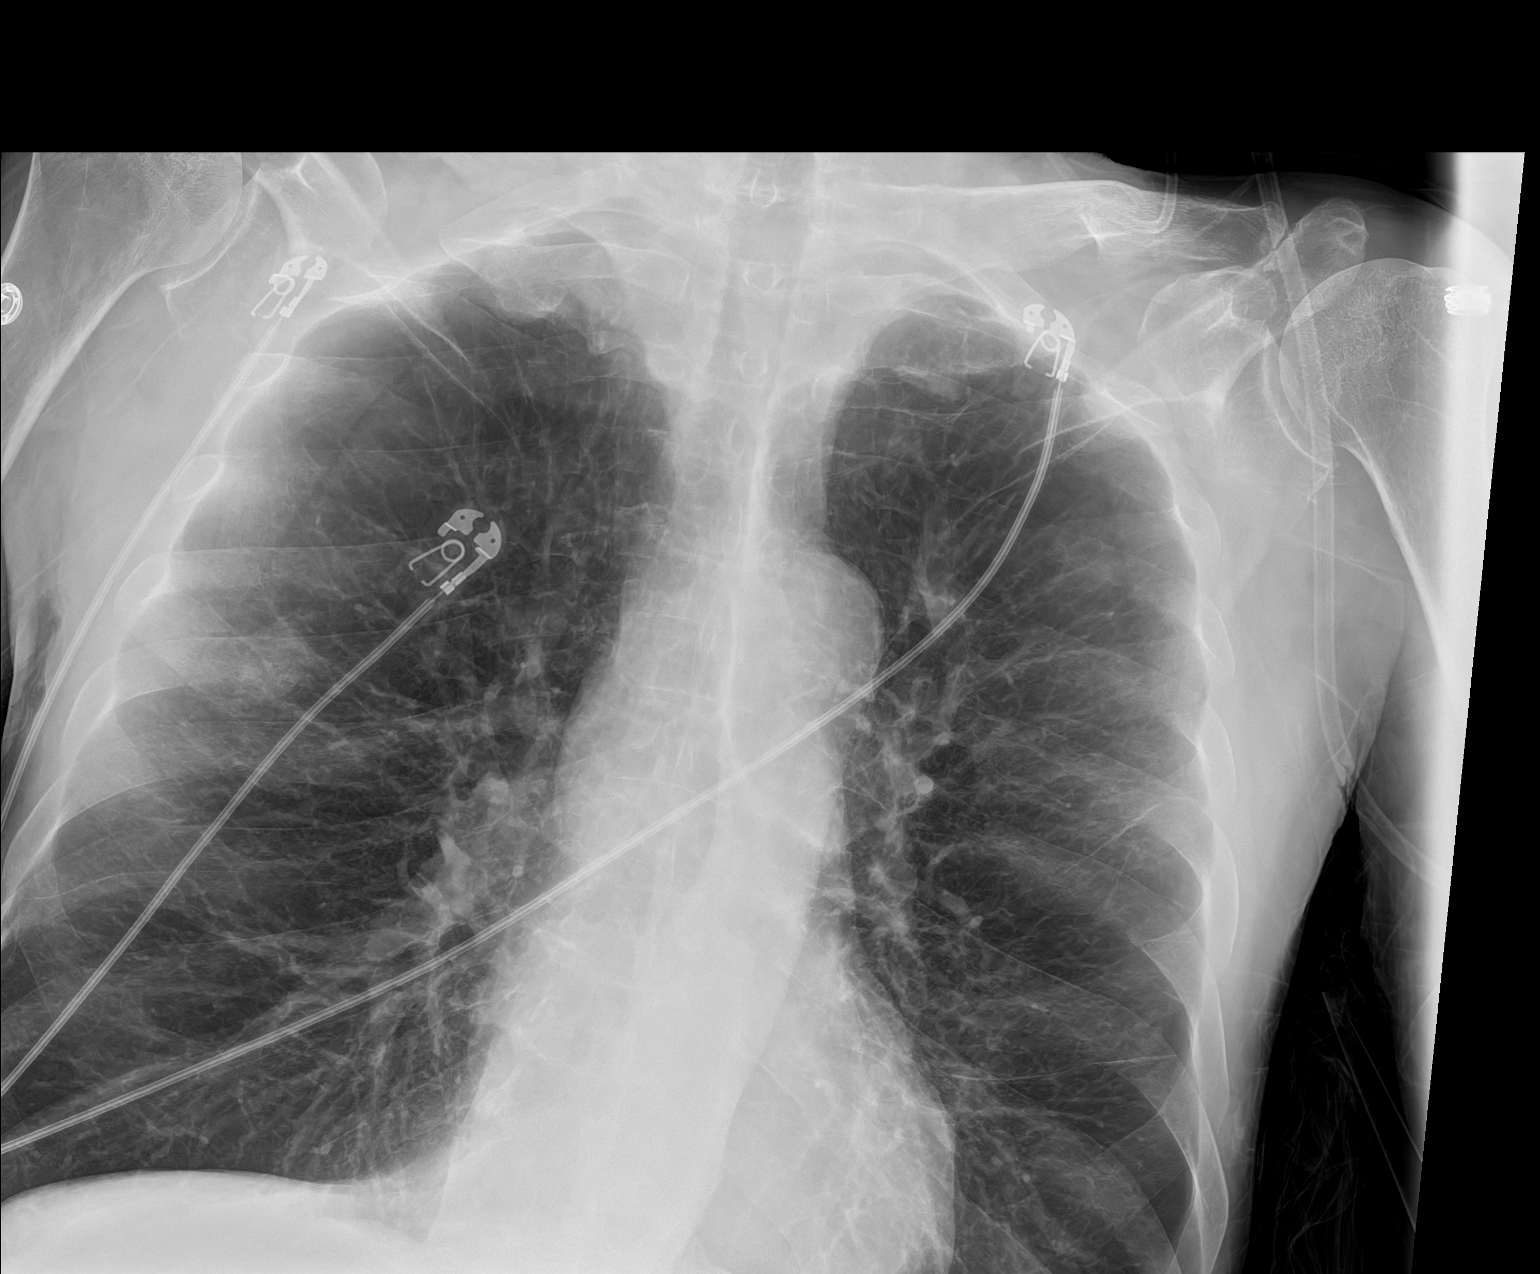

[chest ap (2 of 2)]
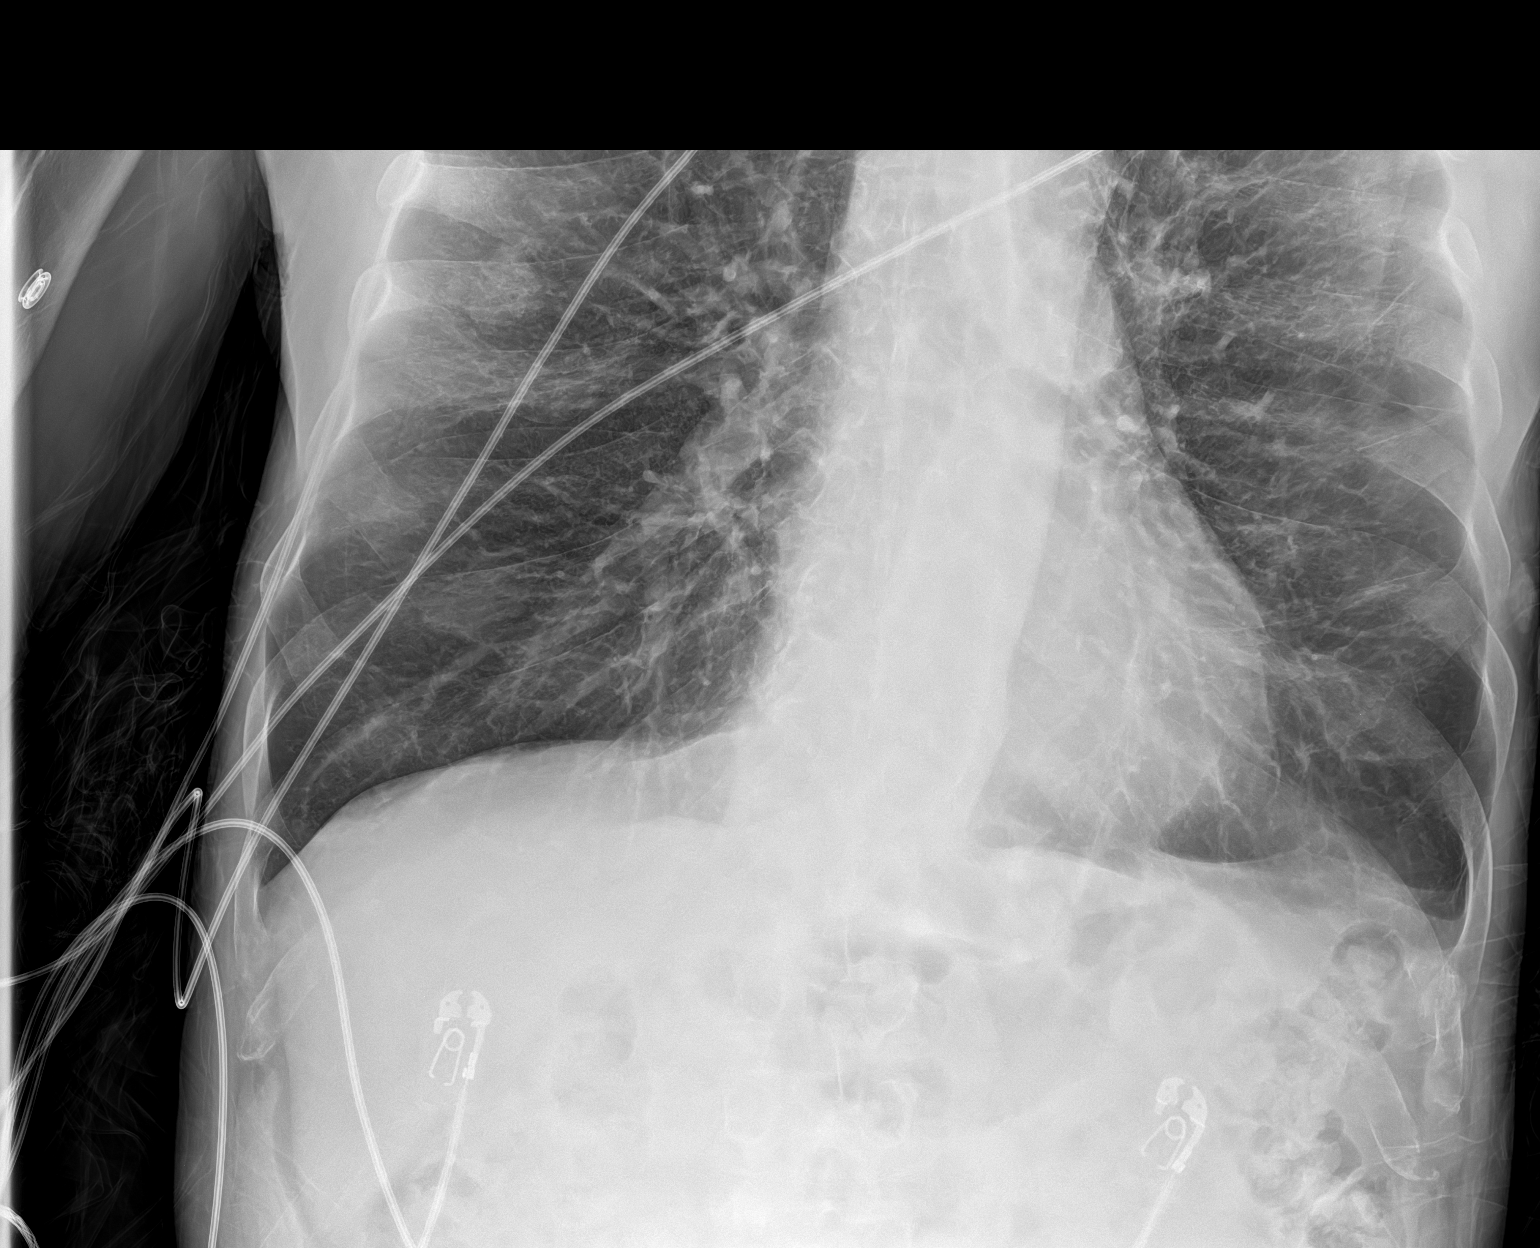

[2 of 2 positions shown; findings below may reference images not displayed]

FINDINGS: Emphysema and bronchitic changes. No consolidation, pleural
effusion, or pneumothorax. Prominent callus at the right sixth rib
fracture accounts for right lower mid lung nodular opacity. Stable
cardiomediastinal silhouette.
IMPRESSION: Emphysema and bronchitic changes without acute airspace disease

## 2022-03-19 MED ORDER — SODIUM CHLORIDE 0.9 % IV SOLN: INTRAVENOUS | Status: DC

## 2022-03-19 MED ORDER — ALBUTEROL SULFATE (2.5 MG/3ML) 0.083% IN NEBU
10.0000 mg | INHALATION_SOLUTION | Freq: Once | RESPIRATORY_TRACT | Status: AC
  Administered 2022-03-19: 10 mg via RESPIRATORY_TRACT
  Filled 2022-03-19: qty 12

## 2022-03-19 MED ORDER — POTASSIUM CHLORIDE CRYS ER 20 MEQ PO TBCR
40.0000 meq | EXTENDED_RELEASE_TABLET | Freq: Once | ORAL | Status: AC
  Administered 2022-03-19: 40 meq via ORAL
  Filled 2022-03-19: qty 2

## 2022-03-19 MED ORDER — ALBUTEROL (5 MG/ML) CONTINUOUS INHALATION SOLN: 10.0000 mg/h | INHALATION_SOLUTION | Freq: Once | RESPIRATORY_TRACT | Status: DC

## 2022-03-19 MED ORDER — METHYLPREDNISOLONE SODIUM SUCC 125 MG IJ SOLR
125.0000 mg | Freq: Once | INTRAMUSCULAR | Status: AC
  Administered 2022-03-19: 125 mg via INTRAVENOUS
  Filled 2022-03-19: qty 2

## 2022-03-19 MED ORDER — ALBUTEROL SULFATE (2.5 MG/3ML) 0.083% IN NEBU
10.0000 mg/h | INHALATION_SOLUTION | RESPIRATORY_TRACT | Status: DC
  Administered 2022-03-19: 10 mg/h via RESPIRATORY_TRACT
  Filled 2022-03-19: qty 6

## 2022-03-19 MED ORDER — DOXYCYCLINE HYCLATE 100 MG PO TABS
100.0000 mg | ORAL_TABLET | Freq: Once | ORAL | Status: AC
Start: 2022-03-19 — End: 2022-03-19
  Administered 2022-03-19: 100 mg via ORAL
  Filled 2022-03-19: qty 1

## 2022-03-19 MED ORDER — MAGNESIUM SULFATE 2 GM/50ML IV SOLN
2.0000 g | Freq: Once | INTRAVENOUS | Status: AC
  Administered 2022-03-19: 2 g via INTRAVENOUS
  Filled 2022-03-19: qty 50

## 2022-03-19 NOTE — ED Notes (Signed)
Pt states that he feels like the continuous neb is smothering him. He wanted this RN to take it off. Pt states that he will try again in 15 min

## 2022-03-19 NOTE — ED Provider Notes (Signed)
4:35 AM Assumed care from Dr. Shawna Orleans, please see their note for full history, physical and decision making until this point. In brief this is a 61 y.o. year old male who presented to the ED tonight with Chest Pain     Likely COPD exacerbation.  Pending CT scan and reevaluation for disposition.  CT scan shows this mass that he is aware of that he is followed with pulmonologist for.  His lungs are still little bit diminished so we will give some more albuterol and reevaluate.  Reevaluation patient has been sleeping comfortably.  He is not tachypneic nor hypoxic on his home oxygen.  We will assess again later  Patient still sleeping comfortably.  Appears to be stable for discharge.  Patient upset about being discharged as he does have anyone to take care of him at home.  I stated there is no medical reason to stay in the hospital.  He was not happy with this and asked me if he can stay here till 8:00.  I told him that that was at the nursing but was medically stable for discharge from the ED.   Discharge instructions, including strict return precautions for new or worsening symptoms, given. Patient and/or family verbalized understanding and agreement with the plan as described.   Labs, studies and imaging reviewed by myself and considered in medical decision making if ordered. Imaging interpreted by radiology.  Labs Reviewed  BASIC METABOLIC PANEL - Abnormal; Notable for the following components:      Result Value   CO2 33 (*)    Glucose, Bld 105 (*)    BUN 21 (*)    All other components within normal limits  CBC - Abnormal; Notable for the following components:   WBC 15.3 (*)    MCV 100.4 (*)    All other components within normal limits  TROPONIN I (HIGH SENSITIVITY)  TROPONIN I (HIGH SENSITIVITY)    CT Chest W Contrast  Final Result    DG Chest 1 View  Final Result      No follow-ups on file.    Park Beck, [name], MD 03/19/22 [phone]

## 2022-03-19 NOTE — ED Provider Notes (Incomplete)
[facility] Provider Note   CSN: [id number] Arrival date & time: 03/19/22  1738     History {Add pertinent medical, surgical, social history, OB history to HPI:1} Chief Complaint  Patient presents with  . Hemoptysis    SYRE KNERR is a 61 y.o. male.  HPI He complains of hemoptysis that started a couple of hours ago.  This is occurred several times.  He has ongoing shortness of breath and is on chronic prednisone therapy as well as oral anticoagulant.  He was in the ED, overnight, evaluated, treated and discharged.  He is a cigarette smoker who is trying to stop smoking and has cut down a lot.  During the ED visit last night he was treated with a bolus of Solu-Medrol, and a couple of nebulizers.  He has home nebulizers but feels like they do not work as well as the ones that we give him here.  He reiterated multiple times that he did not want to go home during evaluation last night and has already stated here that he got discharged too soon.    Home Medications Prior to Admission medications   Medication Sig Start Date End Date Taking? Authorizing Provider  acetaminophen (TYLENOL) 325 MG tablet Take 2 tablets (650 mg total) by mouth every 6 (six) hours as needed for mild pain (or Fever >/= 101). 10/30/21   [name], MD  albuterol (PROVENTIL) (2.5 MG/3ML) 0.083% nebulizer solution Take 3 mLs (2.5 mg total) by nebulization every 4 (four) hours as needed for wheezing or shortness of breath. 12/12/21 12/12/22  [name], MD  albuterol (VENTOLIN HFA) 108 (90 Base) MCG/ACT inhaler Inhale 2 puffs into the lungs every 6 (six) hours as needed for wheezing or shortness of breath. 03/15/22   [name], Pratik D, DO  apixaban (ELIQUIS) 5 MG TABS tablet Take 2 tablets (10 mg total) by mouth 2 (two) times daily for 7 days. 03/15/22 03/22/22  [name], Pratik D, DO  apixaban (ELIQUIS) 5 MG TABS tablet Take 1 tablet (5 mg total) by mouth 2 (two) times daily. 03/22/22 05/21/22  [name],  Pratik D, DO  atorvastatin (LIPITOR) 40 MG tablet TAKE ONE TABLET BY MOUTH DAILY Patient taking differently: Take 40 mg by mouth daily. 07/06/20   [name], MD  benztropine (COGENTIN) 0.5 MG tablet Take 0.5 mg by mouth at bedtime.    [provider]  Budeson-Glycopyrrol-Formoterol (BREZTRI AEROSPHERE) 160-9-4.8 MCG/ACT AERO Inhale 2 puffs into the lungs in the morning and at bedtime. 12/12/21   [name], MD  cetirizine (ZYRTEC) 10 MG tablet Take 10 mg by mouth daily.    [provider]  diclofenac Sodium (VOLTAREN) 1 % GEL Apply 2 g topically 4 (four) times daily as needed (pain).    [provider]  dorzolamide (TRUSOPT) 2 % ophthalmic solution Place 1 drop into the left eye 2 (two) times daily. 12/12/20   [provider]  gabapentin (NEURONTIN) 100 MG capsule Take 1 capsule (100 mg total) by mouth 2 (two) times daily. 03/15/22 04/14/22  [name], Pratik D, DO  guaiFENesin 200 MG tablet Take 2 tablets (400 mg total) by mouth every 6 (six) hours as needed for cough or to loosen phlegm. 03/02/22   Orson Eva, MD  hydrochlorothiazide (HYDRODIURIL) 25 MG tablet Take 0.5 tablets (12.5 mg total) by mouth daily. 10/30/21   [name], MD  hydrOXYzine (ATARAX) 25 MG tablet Take 1 tablet (25 mg total) by mouth 3 (three) times daily  as needed for anxiety or nausea. 10/30/21   Emokpae, Courage, MD  latanoprost (XALATAN) 0.005 % ophthalmic solution Place 1 drop into both eyes at bedtime. 05/24/19   [provider]  losartan (COZAAR) 100 MG tablet Take 100 mg by mouth daily.    [provider]  loxapine (LOXITANE) 10 MG capsule Take 10 mg by mouth at bedtime.     [provider]  mirtazapine (REMERON) 30 MG tablet Take 1 tablet (30 mg total) by mouth at bedtime. 10/30/21   [name], MD  Multiple Vitamin (MULTIVITAMIN WITH MINERALS) TABS tablet Take 1 tablet by mouth daily. 10/31/21   [name], MD  OXYGEN Inhale 3-4 L into the lungs as  directed. 3-4 liters with sleep and exertion as needed for low oxygen    [provider]  pantoprazole (PROTONIX) 40 MG tablet Take 1 tablet (40 mg total) by mouth daily. 10/30/21   [name], MD  polyethylene glycol (MIRALAX / GLYCOLAX) 17 g packet TAKE 17 GRAMS BY MOUTH DAILY Patient taking differently: Take 17 g by mouth daily as needed for mild constipation. 03/09/20   [name], MD  predniSONE (DELTASONE) 20 MG tablet Take 1 tablet (20 mg total) by mouth daily. 03/15/22 04/14/22  [name], Pratik D, DO  risperiDONE (RISPERDAL) 2 MG tablet Take 1 tablet (2 mg total) by mouth at bedtime. 10/30/21   [name], MD  senna-docusate (SENOKOT-S) 8.6-50 MG tablet Take 2 tablets by mouth at bedtime. Patient taking differently: Take 2 tablets by mouth at bedtime as needed for mild constipation. 10/30/21   [name], MD  simethicone (MYLICON) 80 MG chewable tablet Chew 1 tablet (80 mg total) by mouth 4 (four) times daily as needed for flatulence. 03/15/22   [name], Pratik D, DO      Allergies    Penicillins and Levocetirizine    Review of Systems   Review of Systems  Physical Exam Updated Vital Signs BP (!) 137/104   Pulse 100   Temp 98.1 F (36.7 C) (Oral)   Resp (!) 21   SpO2 100%  Physical Exam Vitals and nursing note reviewed.  Constitutional:      General: He is not in acute distress.    Appearance: He is well-developed. He is not ill-appearing, toxic-appearing or diaphoretic.  HENT:     Head: Normocephalic and atraumatic.     Right Ear: External ear normal.     Left Ear: External ear normal.  Eyes:     Conjunctiva/sclera: Conjunctivae normal.     Pupils: Pupils are equal, round, and reactive to light.  Neck:     Trachea: Phonation normal.  Cardiovascular:     Rate and Rhythm: Normal rate and regular rhythm.     Heart sounds: Normal heart sounds.  Pulmonary:     Effort: Pulmonary effort is normal. No respiratory distress.     Breath sounds: No stridor.      Comments: Decreased air movement bilaterally with expiratory wheezing.  Intercostal retractions present.  He is tachypneic. Abdominal:     General: There is no distension.     Palpations: Abdomen is soft.     Tenderness: There is no abdominal tenderness.  Musculoskeletal:        General: Normal range of motion.     Cervical back: Normal range of motion and neck supple.  Skin:    General: Skin is warm and dry.  Neurological:     Mental Status: He is alert and oriented to  person, place, and time.     Cranial Nerves: No cranial nerve deficit.     Sensory: No sensory deficit.     Motor: No abnormal muscle tone.     Coordination: Coordination normal.  Psychiatric:        Behavior: Behavior normal.        Thought Content: Thought content normal.        Judgment: Judgment normal.    ED Results / Procedures / Treatments   Labs (all labs ordered are listed, but only abnormal results are displayed) Labs Reviewed - No data to display  EKG None  Radiology DG Chest 1 View  Result Date: 03/18/2022 CLINICAL DATA:  Chest and abdominal pain. EXAM: CHEST  1 VIEW COMPARISON:  CT chest dated Mar 13, 2022 FINDINGS: The heart size and mediastinal contours are within normal limits. Emphysematous changes of the lungs. Focal consolidation or appreciable pleural effusion. Callus formation of the prior right posterior sixth rib fracture. IMPRESSION: 1. Emphysematous changes without evidence of acute cardiopulmonary process. 2.  Callus formation of the prior right sixth rib fracture. Electronically Signed   By: Keane Police D.O.   On: 03/18/2022 17:05   CT Chest W Contrast  Result Date: 03/19/2022 CLINICAL DATA:  Respiratory illness, nondiagnostic xray. Chest pain. EXAM: CT CHEST WITH CONTRAST TECHNIQUE: Multidetector CT imaging of the chest was performed during intravenous contrast administration. RADIATION DOSE REDUCTION: This exam was performed according to the departmental dose-optimization program  which includes automated exposure control, adjustment of the mA and/or kV according to patient size and/or use of iterative reconstruction technique. CONTRAST:  30mL OMNIPAQUE IOHEXOL 300 MG/ML  SOLN COMPARISON:  03/13/2022 FINDINGS: Cardiovascular: Moderate multi-vessel coronary artery calcification. Global cardiac size within normal limits. No pericardial effusion. Central pulmonary arteries are enlarged in keeping with changes of pulmonary arterial hypertension. Mild atherosclerotic calcification within the thoracic aorta. No aortic aneurysm. Mediastinum/Nodes: No enlarged mediastinal, hilar, or axillary lymph nodes. Thyroid gland, trachea, and esophagus demonstrate no significant findings. Lungs/Pleura: Severe emphysema. Superimposed airway inflammation again noted with bronchial wall thickening. Airway impaction within the lower lobes has improved in the interval since prior examination. Endobronchial mass is again identified within the left upper lobe are pulmonary bronchus extending peripherally into multiple segmental and subsegmental bronchi of the left upper lobe. No pneumothorax or pleural effusion. Upper Abdomen: No acute abnormality. Musculoskeletal: Stable mild compression deformities of T5, T8, T9-T10, T11, and L1. No acute bone abnormality. IMPRESSION: Severe emphysema. Superimposed airway inflammation. Improved airway impaction within the lower lobes bilaterally. 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 Correlation with endoscopy is recommended. Moderate multi-vessel coronary artery calcification. Morphologic changes in keeping with pulmonary arterial hypertension. Numerous thoracic compression deformities, stable since prior examination. Aortic Atherosclerosis (ICD10-I70.0) and Emphysema (ICD10-J43.9). Electronically Signed   By: Fidela Salisbury M.D.   On: 03/19/2022 00:18    Procedures Procedures   {Document cardiac monitor, telemetry assessment procedure when appropriate:1}  Medications Ordered in ED Medications - No data to display  ED Course/ Medical Decision Making/ A&P                           Medical Decision Making Patient presenting for shortness of breath with hemoptysis, on Eliquis, diagnosed with PE and started on anticoagulant, 6 days ago.  History of COPD, clinical exam is consistent with COPD exacerbation with very poor air movement.  He is  on chronic oxygen and prednisone therapy at home.  He is uncomfortable, after recent overnight evaluation last night for similar symptoms.  Problems Addressed: COPD exacerbation ([place]): acute illness or injury that poses a threat to life or bodily functions    Details: Associated with hemoptysis, while on Eliquis.  Amount and/or Complexity of Data Reviewed Independent Historian:     Details: He is a cogent historian External Data Reviewed: labs and notes.    Details: Recent hospitalization 03/13/2022 to 03/15/2022.  Diagnosed with PE during that visit as well as treatment for pneumonia. Labs: ordered.    Details: CBC, metabolic panel, INR, magnesium level normal except magnesium high, white count high, neutrophils high, glucose high, BUN high Radiology: ordered and independent interpretation performed.    Details: Chest x-ray-no pneumonia or edema.  Emphysema and bronchitis findings are present. Discussion of management or test interpretation with external provider(s): Consultation hospitalist for admission.  Risk Prescription drug management. Decision regarding hospitalization. Risk Details: Patient with severe COPD presenting with shortness of breath hemoptysis.  He is on Eliquis.  He has lung cancer; based on imaging done last night, which I do not see previously documented in the EMR before last night.  There is an endobronchial mass with segmental and subsegmental spread.  Patient presented with poor air movement and stable  oxygenation on his chronic 4 L nasal cannula.  Concern for reported hemoptysis, with history anticoagulation for PE, and apparently newly found left lung cancer which is endobronchial with spread in the airways.  This has not yet been addressed by his pulmonologist or oncology, as far as I can tell.  We will consult hospitalist evaluate for admission for stabilization and further treatment as needed.   ***  {Document critical care time when appropriate:1} {Document review of labs and clinical decision tools ie heart score, Chads2Vasc2 etc:1}  {Document your independent review of radiology images, and any outside records:1} {Document your discussion with family members, caretakers, and with consultants:1} {Document social determinants of health affecting pt's care:1} {Document your decision making why or why not admission, treatments were needed:1} Final Clinical Impression(s) / ED Diagnoses Final diagnoses:  None    Rx / DC Orders ED Discharge Orders     None

## 2022-03-19 NOTE — ED Provider Notes (Signed)
[facility] Provider Note   CSN: [id number] Arrival date & time: 03/19/22  1738     History {Add pertinent medical, surgical, social history, OB history to HPI:1} Chief Complaint  Patient presents with   Hemoptysis    Tony Sullivan is a 61 y.o. male.  HPI He complains of hemoptysis that started a couple of hours ago.  This is occurred several times.  He has ongoing shortness of breath and is on chronic prednisone therapy as well as oral anticoagulant.  He was in the ED, overnight, evaluated, treated and discharged.  He is a cigarette smoker who is trying to stop smoking and has cut down a lot.  During the ED visit last night he was treated with a bolus of Solu-Medrol, and a couple of nebulizers.  He has home nebulizers but feels like they do not work as well as the ones that we give him here.  He reiterated multiple times that he did not want to go home during evaluation last night and has already stated here that he got discharged too soon.    Home Medications Prior to Admission medications   Medication Sig Start Date End Date Taking? Authorizing Provider  acetaminophen (TYLENOL) 325 MG tablet Take 2 tablets (650 mg total) by mouth every 6 (six) hours as needed for mild pain (or Fever >/= 101). 10/30/21   [name], MD  albuterol (PROVENTIL) (2.5 MG/3ML) 0.083% nebulizer solution Take 3 mLs (2.5 mg total) by nebulization every 4 (four) hours as needed for wheezing or shortness of breath. 12/12/21 12/12/22  [name], MD  albuterol (VENTOLIN HFA) 108 (90 Base) MCG/ACT inhaler Inhale 2 puffs into the lungs every 6 (six) hours as needed for wheezing or shortness of breath. 03/15/22   [name], Pratik D, DO  apixaban (ELIQUIS) 5 MG TABS tablet Take 2 tablets (10 mg total) by mouth 2 (two) times daily for 7 days. 03/15/22 03/22/22  [name], Pratik D, DO  apixaban (ELIQUIS) 5 MG TABS tablet Take 1 tablet (5 mg total) by mouth 2 (two) times daily. 03/22/22 05/21/22  [name],  Pratik D, DO  atorvastatin (LIPITOR) 40 MG tablet TAKE ONE TABLET BY MOUTH DAILY Patient taking differently: Take 40 mg by mouth daily. 07/06/20   [name], MD  benztropine (COGENTIN) 0.5 MG tablet Take 0.5 mg by mouth at bedtime.    [provider]  Budeson-Glycopyrrol-Formoterol (BREZTRI AEROSPHERE) 160-9-4.8 MCG/ACT AERO Inhale 2 puffs into the lungs in the morning and at bedtime. 12/12/21   [name], MD  cetirizine (ZYRTEC) 10 MG tablet Take 10 mg by mouth daily.    [provider]  diclofenac Sodium (VOLTAREN) 1 % GEL Apply 2 g topically 4 (four) times daily as needed (pain).    [provider]  dorzolamide (TRUSOPT) 2 % ophthalmic solution Place 1 drop into the left eye 2 (two) times daily. 12/12/20   [provider]  gabapentin (NEURONTIN) 100 MG capsule Take 1 capsule (100 mg total) by mouth 2 (two) times daily. 03/15/22 04/14/22  [name], Pratik D, DO  guaiFENesin 200 MG tablet Take 2 tablets (400 mg total) by mouth every 6 (six) hours as needed for cough or to loosen phlegm. 03/02/22   Orson Eva, MD  hydrochlorothiazide (HYDRODIURIL) 25 MG tablet Take 0.5 tablets (12.5 mg total) by mouth daily. 10/30/21   [name], MD  hydrOXYzine (ATARAX) 25 MG tablet Take 1 tablet (25 mg total) by mouth 3 (three) times daily  as needed for anxiety or nausea. 10/30/21   Emokpae, Courage, MD  latanoprost (XALATAN) 0.005 % ophthalmic solution Place 1 drop into both eyes at bedtime. 05/24/19   [provider]  losartan (COZAAR) 100 MG tablet Take 100 mg by mouth daily.    [provider]  loxapine (LOXITANE) 10 MG capsule Take 10 mg by mouth at bedtime.     [provider]  mirtazapine (REMERON) 30 MG tablet Take 1 tablet (30 mg total) by mouth at bedtime. 10/30/21   [name], MD  Multiple Vitamin (MULTIVITAMIN WITH MINERALS) TABS tablet Take 1 tablet by mouth daily. 10/31/21   [name], MD  OXYGEN Inhale 3-4 L into the lungs as  directed. 3-4 liters with sleep and exertion as needed for low oxygen    [provider]  pantoprazole (PROTONIX) 40 MG tablet Take 1 tablet (40 mg total) by mouth daily. 10/30/21   [name], MD  polyethylene glycol (MIRALAX / GLYCOLAX) 17 g packet TAKE 17 GRAMS BY MOUTH DAILY Patient taking differently: Take 17 g by mouth daily as needed for mild constipation. 03/09/20   [name], MD  predniSONE (DELTASONE) 20 MG tablet Take 1 tablet (20 mg total) by mouth daily. 03/15/22 04/14/22  [name], Pratik D, DO  risperiDONE (RISPERDAL) 2 MG tablet Take 1 tablet (2 mg total) by mouth at bedtime. 10/30/21   [name], MD  senna-docusate (SENOKOT-S) 8.6-50 MG tablet Take 2 tablets by mouth at bedtime. Patient taking differently: Take 2 tablets by mouth at bedtime as needed for mild constipation. 10/30/21   [name], MD  simethicone (MYLICON) 80 MG chewable tablet Chew 1 tablet (80 mg total) by mouth 4 (four) times daily as needed for flatulence. 03/15/22   [name], Pratik D, DO      Allergies    Penicillins and Levocetirizine    Review of Systems   Review of Systems  Physical Exam Updated Vital Signs BP (!) 137/104   Pulse 100   Temp 98.1 F (36.7 C) (Oral)   Resp (!) 21   SpO2 100%  Physical Exam Vitals and nursing note reviewed.  Constitutional:      General: He is not in acute distress.    Appearance: He is well-developed. He is not ill-appearing, toxic-appearing or diaphoretic.  HENT:     Head: Normocephalic and atraumatic.     Right Ear: External ear normal.     Left Ear: External ear normal.  Eyes:     Conjunctiva/sclera: Conjunctivae normal.     Pupils: Pupils are equal, round, and reactive to light.  Neck:     Trachea: Phonation normal.  Cardiovascular:     Rate and Rhythm: Normal rate and regular rhythm.     Heart sounds: Normal heart sounds.  Pulmonary:     Effort: Pulmonary effort is normal. No respiratory distress.     Breath sounds: Normal breath  sounds.  Abdominal:     General: There is no distension.     Palpations: Abdomen is soft.     Tenderness: There is no abdominal tenderness.  Musculoskeletal:        General: Normal range of motion.     Cervical back: Normal range of motion and neck supple.  Skin:    General: Skin is warm and dry.  Neurological:     Mental Status: He is alert and oriented to person, place, and time.     Cranial Nerves: No cranial nerve deficit.  Sensory: No sensory deficit.     Motor: No abnormal muscle tone.     Coordination: Coordination normal.  Psychiatric:        Behavior: Behavior normal.        Thought Content: Thought content normal.        Judgment: Judgment normal.    ED Results / Procedures / Treatments   Labs (all labs ordered are listed, but only abnormal results are displayed) Labs Reviewed - No data to display  EKG None  Radiology DG Chest 1 View  Result Date: 03/18/2022 CLINICAL DATA:  Chest and abdominal pain. EXAM: CHEST  1 VIEW COMPARISON:  CT chest dated Mar 13, 2022 FINDINGS: The heart size and mediastinal contours are within normal limits. Emphysematous changes of the lungs. Focal consolidation or appreciable pleural effusion. Callus formation of the prior right posterior sixth rib fracture. IMPRESSION: 1. Emphysematous changes without evidence of acute cardiopulmonary process. 2.  Callus formation of the prior right sixth rib fracture. Electronically Signed   By: Keane Police D.O.   On: 03/18/2022 17:05   CT Chest W Contrast  Result Date: 03/19/2022 CLINICAL DATA:  Respiratory illness, nondiagnostic xray. Chest pain. EXAM: CT CHEST WITH CONTRAST TECHNIQUE: Multidetector CT imaging of the chest was performed during intravenous contrast administration. RADIATION DOSE REDUCTION: This exam was performed according to the departmental dose-optimization program which includes automated exposure control, adjustment of the mA and/or kV according to patient size and/or use of  iterative reconstruction technique. CONTRAST:  57mL OMNIPAQUE IOHEXOL 300 MG/ML  SOLN COMPARISON:  03/13/2022 FINDINGS: Cardiovascular: Moderate multi-vessel coronary artery calcification. Global cardiac size within normal limits. No pericardial effusion. Central pulmonary arteries are enlarged in keeping with changes of pulmonary arterial hypertension. Mild atherosclerotic calcification within the thoracic aorta. No aortic aneurysm. Mediastinum/Nodes: No enlarged mediastinal, hilar, or axillary lymph nodes. Thyroid gland, trachea, and esophagus demonstrate no significant findings. Lungs/Pleura: Severe emphysema. Superimposed airway inflammation again noted with bronchial wall thickening. Airway impaction within the lower lobes has improved in the interval since prior examination. Endobronchial mass is again identified within the left upper lobe are pulmonary bronchus extending peripherally into multiple segmental and subsegmental bronchi of the left upper lobe. No pneumothorax or pleural effusion. Upper Abdomen: No acute abnormality. Musculoskeletal: Stable mild compression deformities of T5, T8, T9-T10, T11, and L1. No acute bone abnormality. IMPRESSION: Severe emphysema. Superimposed airway inflammation. Improved airway impaction within the lower lobes bilaterally. 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 Correlation with endoscopy is recommended. Moderate multi-vessel coronary artery calcification. Morphologic changes in keeping with pulmonary arterial hypertension. Numerous thoracic compression deformities, stable since prior examination. Aortic Atherosclerosis (ICD10-I70.0) and Emphysema (ICD10-J43.9). Electronically Signed   By: Fidela Salisbury M.D.   On: 03/19/2022 00:18    Procedures Procedures  {Document cardiac monitor, telemetry assessment procedure when appropriate:1}  Medications Ordered in ED Medications -  No data to display  ED Course/ Medical Decision Making/ A&P                           Medical Decision Making  ***  {Document critical care time when appropriate:1} {Document review of labs and clinical decision tools ie heart score, Chads2Vasc2 etc:1}  {Document your independent review of radiology images, and any outside records:1} {Document your discussion with family members, caretakers, and with consultants:1} {Document social determinants of health affecting pt's care:1} {Document your decision making why  or why not admission, treatments were needed:1} Final Clinical Impression(s) / ED Diagnoses Final diagnoses:  None    Rx / DC Orders ED Discharge Orders     None

## 2022-03-19 NOTE — ED Triage Notes (Signed)
Pt stays he's coughing up blood starting around 3 hours ago, was here this am and believes he was discharge too early.

## 2022-03-20 DIAGNOSIS — K219 Gastro-esophageal reflux disease without esophagitis: Secondary | ICD-10-CM | POA: Diagnosis present

## 2022-03-20 DIAGNOSIS — S22000A Wedge compression fracture of unspecified thoracic vertebra, initial encounter for closed fracture: Secondary | ICD-10-CM

## 2022-03-20 DIAGNOSIS — J449 Chronic obstructive pulmonary disease, unspecified: Secondary | ICD-10-CM | POA: Diagnosis not present

## 2022-03-20 DIAGNOSIS — R5381 Other malaise: Secondary | ICD-10-CM | POA: Diagnosis present

## 2022-03-20 DIAGNOSIS — Z8249 Family history of ischemic heart disease and other diseases of the circulatory system: Secondary | ICD-10-CM | POA: Diagnosis not present

## 2022-03-20 DIAGNOSIS — I2699 Other pulmonary embolism without acute cor pulmonale: Secondary | ICD-10-CM | POA: Diagnosis present

## 2022-03-20 DIAGNOSIS — F17210 Nicotine dependence, cigarettes, uncomplicated: Secondary | ICD-10-CM | POA: Diagnosis present

## 2022-03-20 DIAGNOSIS — Z993 Dependence on wheelchair: Secondary | ICD-10-CM | POA: Diagnosis not present

## 2022-03-20 DIAGNOSIS — J9621 Acute and chronic respiratory failure with hypoxia: Secondary | ICD-10-CM | POA: Diagnosis present

## 2022-03-20 DIAGNOSIS — Z832 Family history of diseases of the blood and blood-forming organs and certain disorders involving the immune mechanism: Secondary | ICD-10-CM | POA: Diagnosis not present

## 2022-03-20 DIAGNOSIS — R918 Other nonspecific abnormal finding of lung field: Secondary | ICD-10-CM | POA: Diagnosis not present

## 2022-03-20 DIAGNOSIS — Z888 Allergy status to other drugs, medicaments and biological substances status: Secondary | ICD-10-CM | POA: Diagnosis not present

## 2022-03-20 DIAGNOSIS — Z833 Family history of diabetes mellitus: Secondary | ICD-10-CM | POA: Diagnosis not present

## 2022-03-20 DIAGNOSIS — Z20822 Contact with and (suspected) exposure to covid-19: Secondary | ICD-10-CM | POA: Diagnosis present

## 2022-03-20 DIAGNOSIS — R718 Other abnormality of red blood cells: Secondary | ICD-10-CM

## 2022-03-20 DIAGNOSIS — Z720 Tobacco use: Secondary | ICD-10-CM

## 2022-03-20 DIAGNOSIS — E782 Mixed hyperlipidemia: Secondary | ICD-10-CM

## 2022-03-20 DIAGNOSIS — J441 Chronic obstructive pulmonary disease with (acute) exacerbation: Secondary | ICD-10-CM

## 2022-03-20 DIAGNOSIS — L89152 Pressure ulcer of sacral region, stage 2: Secondary | ICD-10-CM | POA: Diagnosis present

## 2022-03-20 DIAGNOSIS — C3412 Malignant neoplasm of upper lobe, left bronchus or lung: Secondary | ICD-10-CM | POA: Diagnosis present

## 2022-03-20 DIAGNOSIS — R042 Hemoptysis: Secondary | ICD-10-CM | POA: Diagnosis present

## 2022-03-20 DIAGNOSIS — J9622 Acute and chronic respiratory failure with hypercapnia: Secondary | ICD-10-CM | POA: Diagnosis present

## 2022-03-20 DIAGNOSIS — Z9981 Dependence on supplemental oxygen: Secondary | ICD-10-CM | POA: Diagnosis not present

## 2022-03-20 DIAGNOSIS — Z880 Allergy status to penicillin: Secondary | ICD-10-CM | POA: Diagnosis not present

## 2022-03-20 DIAGNOSIS — Z825 Family history of asthma and other chronic lower respiratory diseases: Secondary | ICD-10-CM | POA: Diagnosis not present

## 2022-03-20 DIAGNOSIS — F319 Bipolar disorder, unspecified: Secondary | ICD-10-CM | POA: Diagnosis present

## 2022-03-20 DIAGNOSIS — I10 Essential (primary) hypertension: Secondary | ICD-10-CM | POA: Diagnosis present

## 2022-03-20 DIAGNOSIS — S22000D Wedge compression fracture of unspecified thoracic vertebra, subsequent encounter for fracture with routine healing: Secondary | ICD-10-CM

## 2022-03-20 DIAGNOSIS — X58XXXD Exposure to other specified factors, subsequent encounter: Secondary | ICD-10-CM | POA: Diagnosis present

## 2022-03-20 DIAGNOSIS — R7303 Prediabetes: Secondary | ICD-10-CM | POA: Diagnosis present

## 2022-03-20 DIAGNOSIS — J439 Emphysema, unspecified: Secondary | ICD-10-CM | POA: Diagnosis present

## 2022-03-20 DIAGNOSIS — Z981 Arthrodesis status: Secondary | ICD-10-CM | POA: Diagnosis not present

## 2022-03-20 LAB — CBC
HCT: 35.9 % — ABNORMAL LOW (ref 39.0–52.0)
Hemoglobin: 11.4 g/dL — ABNORMAL LOW (ref 13.0–17.0)
MCH: 32.1 pg (ref 26.0–34.0)
MCHC: 31.8 g/dL (ref 30.0–36.0)
MCV: 101.1 fL — ABNORMAL HIGH (ref 80.0–100.0)
Platelets: 189 10*3/uL (ref 150–400)
RBC: 3.55 MIL/uL — ABNORMAL LOW (ref 4.22–5.81)
RDW: 15 % (ref 11.5–15.5)
WBC: 11.3 10*3/uL — ABNORMAL HIGH (ref 4.0–10.5)
nRBC: 0 % (ref 0.0–0.2)

## 2022-03-20 LAB — FOLATE: Folate: 11.8 ng/mL (ref >5.9)

## 2022-03-20 LAB — RESP PANEL BY RT-PCR (FLU A&B, COVID) ARPGX2
Influenza A by PCR: NEGATIVE
Influenza B by PCR: NEGATIVE
SARS Coronavirus 2 by RT PCR: NEGATIVE

## 2022-03-20 LAB — PHOSPHORUS: Phosphorus: 2.2 mg/dL — ABNORMAL LOW (ref 2.5–4.6)

## 2022-03-20 LAB — COMPREHENSIVE METABOLIC PANEL
ALT: 64 U/L — ABNORMAL HIGH (ref 0–44)
AST: 29 U/L (ref 15–41)
Albumin: 2.5 g/dL — ABNORMAL LOW (ref 3.5–5.0)
Alkaline Phosphatase: 89 U/L (ref 38–126)
Anion gap: 8 (ref 5–15)
BUN: 23 mg/dL — ABNORMAL HIGH (ref 6–20)
CO2: 29 mmol/L (ref 22–32)
Calcium: 8.4 mg/dL — ABNORMAL LOW (ref 8.9–10.3)
Chloride: 104 mmol/L (ref 98–111)
Creatinine, Ser: 0.92 mg/dL (ref 0.61–1.24)
GFR, Estimated: >60 mL/min (ref >60)
Glucose, Bld: 235 mg/dL — ABNORMAL HIGH (ref 70–99)
Potassium: 3.5 mmol/L (ref 3.5–5.1)
Sodium: 141 mmol/L (ref 135–145)
Total Bilirubin: 0.1 mg/dL — ABNORMAL LOW (ref 0.3–1.2)
Total Protein: 4.9 g/dL — ABNORMAL LOW (ref 6.5–8.1)

## 2022-03-20 LAB — VITAMIN B12: Vitamin B-12: 841 pg/mL (ref 180–914)

## 2022-03-20 LAB — MAGNESIUM: Magnesium: 2.6 mg/dL — ABNORMAL HIGH (ref 1.7–2.4)

## 2022-03-20 MED ORDER — MOMETASONE FURO-FORMOTEROL FUM 200-5 MCG/ACT IN AERO
2.0000 | INHALATION_SPRAY | Freq: Two times a day (BID) | RESPIRATORY_TRACT | Status: DC
  Administered 2022-03-20 – 2022-03-25 (×10): 2 via RESPIRATORY_TRACT
  Filled 2022-03-20: qty 8.8

## 2022-03-20 MED ORDER — DOXYCYCLINE HYCLATE 100 MG PO TABS
100.0000 mg | ORAL_TABLET | Freq: Two times a day (BID) | ORAL | Status: DC
  Administered 2022-03-20 – 2022-03-25 (×10): 100 mg via ORAL
  Filled 2022-03-20 (×10): qty 1

## 2022-03-20 MED ORDER — ACETAMINOPHEN 650 MG RE SUPP: 650.0000 mg | Freq: Four times a day (QID) | RECTAL | Status: DC | PRN

## 2022-03-20 MED ORDER — ALPRAZOLAM 0.5 MG PO TABS
0.5000 mg | ORAL_TABLET | Freq: Three times a day (TID) | ORAL | Status: DC | PRN
  Administered 2022-03-21 – 2022-03-25 (×7): 0.5 mg via ORAL
  Filled 2022-03-20 (×7): qty 1

## 2022-03-20 MED ORDER — SIMETHICONE 80 MG PO CHEW
80.0000 mg | CHEWABLE_TABLET | Freq: Four times a day (QID) | ORAL | Status: DC | PRN
  Administered 2022-03-21 – 2022-03-24 (×4): 80 mg via ORAL
  Filled 2022-03-20 (×4): qty 1

## 2022-03-20 MED ORDER — PANTOPRAZOLE SODIUM 40 MG PO TBEC
40.0000 mg | DELAYED_RELEASE_TABLET | Freq: Every day | ORAL | Status: DC
  Administered 2022-03-20 – 2022-03-25 (×5): 40 mg via ORAL
  Filled 2022-03-20 (×6): qty 1

## 2022-03-20 MED ORDER — ACETAMINOPHEN 325 MG PO TABS
650.0000 mg | ORAL_TABLET | Freq: Four times a day (QID) | ORAL | Status: DC | PRN
  Administered 2022-03-22 – 2022-03-23 (×2): 650 mg via ORAL
  Filled 2022-03-20 (×2): qty 2

## 2022-03-20 MED ORDER — DM-GUAIFENESIN ER 30-600 MG PO TB12
1.0000 | ORAL_TABLET | Freq: Two times a day (BID) | ORAL | Status: DC
  Administered 2022-03-20: 1 via ORAL
  Filled 2022-03-20: qty 1

## 2022-03-20 MED ORDER — ORAL CARE MOUTH RINSE
15.0000 mL | Freq: Two times a day (BID) | OROMUCOSAL | Status: DC
  Administered 2022-03-20 – 2022-03-24 (×8): 15 mL via OROMUCOSAL

## 2022-03-20 MED ORDER — METHYLPREDNISOLONE SODIUM SUCC 40 MG IJ SOLR
40.0000 mg | Freq: Two times a day (BID) | INTRAMUSCULAR | Status: DC
Start: 2022-03-20 — End: 2022-03-21
  Administered 2022-03-20 (×2): 40 mg via INTRAVENOUS
  Filled 2022-03-20 (×3): qty 1

## 2022-03-20 MED ORDER — LOSARTAN POTASSIUM 50 MG PO TABS
100.0000 mg | ORAL_TABLET | Freq: Every day | ORAL | Status: DC
  Administered 2022-03-20 – 2022-03-25 (×6): 100 mg via ORAL
  Filled 2022-03-20 (×6): qty 2

## 2022-03-20 MED ORDER — ATORVASTATIN CALCIUM 40 MG PO TABS
40.0000 mg | ORAL_TABLET | Freq: Every day | ORAL | Status: DC
  Administered 2022-03-20 – 2022-03-25 (×6): 40 mg via ORAL
  Filled 2022-03-20 (×6): qty 1

## 2022-03-20 MED ORDER — IPRATROPIUM-ALBUTEROL 0.5-2.5 (3) MG/3ML IN SOLN: 3.0000 mL | RESPIRATORY_TRACT | Status: DC | PRN

## 2022-03-20 MED ORDER — GUAIFENESIN-DM 100-10 MG/5ML PO SYRP
10.0000 mL | ORAL_SOLUTION | Freq: Three times a day (TID) | ORAL | Status: DC
  Administered 2022-03-20 – 2022-03-23 (×9): 10 mL via ORAL
  Filled 2022-03-20 (×9): qty 10

## 2022-03-20 MED ORDER — IPRATROPIUM-ALBUTEROL 0.5-2.5 (3) MG/3ML IN SOLN
3.0000 mL | Freq: Four times a day (QID) | RESPIRATORY_TRACT | Status: DC
  Administered 2022-03-20 – 2022-03-24 (×14): 3 mL via RESPIRATORY_TRACT
  Filled 2022-03-20 (×16): qty 3

## 2022-03-20 NOTE — Progress Notes (Signed)
PROGRESS NOTE    Patient: Tony Sullivan                            PCP: [name], FNP                    DOB: April 17, 1961            DOA: 03/19/2022 WCH:[id number]             DOS: 03/20/2022, 11:04 AM   LOS: 0 days   Date of Service: The patient was seen and examined on 03/20/2022  Subjective:   The patient was seen and examined this morning. Stable at this time. Complain generalized aches and pain, shortness of breath, improved cough.Tony Sullivan he is hungry he would like to eat  Brief Narrative:    Tony Sullivan is a 61 y.o. male with medical history significant of chronic respiratory failure with hypoxia secondary to COPD on supplemental oxygen via [place] at 3 LPM, hyperlipidemia, hypertension, GERD, tobacco abuse who presents to the emergency department due to blood in sputum which started about 2 hours PTA.   Patient presented to ED 2 nights ago with complaints of several days of wheezing and shortness of breath, he also complained of chest tightness.  CT scan was done which showed a mass that was reported that he (the patient) was aware of and was following with a pulmonologist.  He was treated for a COPD exacerbation, symptoms improved and patient was discharged home, though, patient did not want to be discharged due to him still having some shortness of breath.  Patient states that after he got home, he noted blood when he coughs and since is still had some shortness of breath, he decided to return to the ED for further evaluation and management. Patient is a chronic smoker who states that he has cut down a lot on smoking and was trying to quit.  He denies fever, chills, headache, nausea, vomiting, abdominal pain.   Patient was recently admitted from 5/20 to 05/19 due to acute on chronic hypoxemic respiratory failure due to pneumonia with possible aspiration and then later also noted to have PE, he was treated with Levaquin and started on Eliquis.  Oxygen requirement at admission  (6 L) was weaned down to baseline oxygen requirement at 3 LPM   ED Course:  Pt was intermittently tachypneic, other vital signs are within normal range.  O2 sat was 98-100% on supplemental oxygen 4 LPM.  Work-up in the ED showed normal CBC except for leukocytosis and MCV of 100.7.  BMP was normal except for BUN of 21, magnesium 2.5, troponin x2 was flat at 15.   Influenza A, B, SARS coronavirus 2 was negative. Chest x-ray showed emphysema and bronchitis changes without acute airspace disease CT chest with contrast showed severe emphysema. Superimposed airway inflammation. Improved airway- impaction within the lower lobes bilaterally.   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 Correlation with endoscopy is recommended. Solumedrol, magnesium and potassium were given, breathing treatment with albuterol was provided and doxycycline was given as well.      Assessment & Plan:   Principal Problem:   COPD with acute exacerbation ([place]) Active Problems:   Tobacco abuse   Essential hypertension   GERD (gastroesophageal reflux disease)   Cigarette smoker   Acute on chronic respiratory failure with hypoxia ([place])  Mixed hyperlipidemia   Elevated MCV   Thoracic compression fracture ([facility])   Hemoptysis     Assessment and Plan:   Acute on chronic respiratory failure with hypoxia secondary to COPD exacerbation -Still requiring 4 L of oxygen, 98% Apparently baseline O2 demand 2-4 L Continue duo nebs, Mucinex, Dulera, Solu-Medrol, doxycycline. 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   Correlation with endoscopy is recommended Patient will be temporarily placed n.p.o. in anticipation for possible bronchoscopy in the morning Pulmonology will be consulted and we shall await further recommendations  Elevated MCV MCV 100.7 Folate levels and vitamin B12 levels will be checked   Prior Thoracic compression fracture Stable, this occurred in 2019   Essential hypertension Continue losartan   GERD Continue Protonix   Mixed hyperlipidemia Continue Lipitor   Pulmonary embolism Temporarily hold off on Eliquis in anticipation for possible bronchoscopy by pulmonologist -Stable   Tobacco abuse Counseled on tobacco abuse cessation, patient states that he is working on cessation Declined NicoDerm     Skin Assessment: I have examined the patient's skin and I agree with the wound assessment as performed by wound care team As outlined belowe: Pressure Injury 03/14/22 Coccyx Stage 2 -  Partial thickness loss of dermis presenting as a shallow open injury with a red, pink wound bed without slough. (Active)  03/14/22 1211  Location: Coccyx  Location Orientation:   Staging: Stage 2 -  Partial thickness loss of dermis presenting as a shallow open injury with a red, pink wound bed without slough.  Wound Description (Comments):   Present on Admission:   Dressing Type Foam - Lift dressing to assess site every shift 03/20/22 0227   ----------------------------------------------------------------------------------------------------------------  Consults: Pulmonology  DVT prophylaxis:  SCDs Start: 03/20/22 0113   Code Status:   Code Status: Full Code  Family Communication: No family member present at bedside-  The above findings and plan of care has been discussed with patient  in detail,  they expressed understanding and agreement of  above. -Advance care planning has been discussed.   Admission status:   Status is: Inpatient Remains inpatient appropriate because: Needing breathing treatment, supplemental oxygen, needs O2 supplement     Procedures:   No admission procedures for hospital encounter.   Antimicrobials:  Anti-infectives (From admission, onward)    Start     Dose/Rate Route Frequency Ordered Stop   03/20/22 1000  doxycycline (VIBRA-TABS) tablet 100 mg        100 mg Oral Every 12 hours 03/20/22 0143     03/19/22 2115  doxycycline (VIBRA-TABS) tablet 100 mg        100 mg Oral  Once 03/19/22 2113 03/19/22 2133        Medication:   atorvastatin  40 mg Oral Daily   doxycycline  100 mg Oral Q12H   guaiFENesin-dextromethorphan  10 mL Oral Q8H   ipratropium-albuterol  3 mL Nebulization Q6H   losartan  100 mg Oral Daily   mouth rinse  15 mL Mouth Rinse BID   methylPREDNISolone (SOLU-MEDROL) injection  40 mg Intravenous Q12H   mometasone-formoterol  2 puff Inhalation BID  pantoprazole  40 mg Oral Daily    acetaminophen **OR** acetaminophen, ALPRAZolam, ipratropium-albuterol   Objective:   Vitals:   03/20/22 0128 03/20/22 0523 03/20/22 0733 03/20/22 1005  BP:  (!) 151/88  (!) 152/98  Pulse:  63  77  Resp:  20  18  Temp:  (!) 97.4 F (36.3 C)  98.3 F (36.8 C)  TempSrc:  Oral  Oral  SpO2:  100% 100% 98%  Weight: 65.8 kg     Height: 5\' 11"  (1.803 m)       Intake/Output Summary (Last 24 hours) at 03/20/2022 1104 Last data filed at 03/20/2022 1006 Gross per 24 hour  Intake 1968.1 ml  Output 200 ml  Net 1768.1 ml   Filed Weights   03/20/22 0128  Weight: 65.8 kg     Examination:      Physical Exam:   General:  AAO x 3,  cooperative, no distress;   HEENT:  Normocephalic, PERRL, otherwise with in Normal limits   Neuro:  CNII-XII intact. , normal motor and sensation, reflexes intact   Lungs:   Clear to auscultation BL, Respirations unlabored,  No wheezes / crackles  Cardio:     S1/S2, RRR, No murmure, No Rubs or Gallops   Abdomen:  Soft, non-tender, bowel sounds active all four quadrants, no guarding or peritoneal signs.  Muscular  skeletal:  Limited exam -global generalized weaknesses - in bed, able to move all 4 extremities,   2+ pulses,  symmetric, No pitting edema  Skin:  Dry, warm to touch, negative for any Rashes,  Wounds: Please see nursing documentation  Pressure Injury 03/14/22 Coccyx Stage 2 -  Partial thickness loss of dermis presenting as a shallow open injury with a red, pink wound bed without slough. (Active)  03/14/22 1211  Location: Coccyx  Location Orientation:   Staging: Stage 2 -  Partial thickness loss of dermis presenting as a shallow open injury with a red, pink wound bed without slough.  Wound Description (Comments):   Present on Admission:   Dressing Type Foam - Lift dressing to assess site every shift 03/20/22 0227        --------------------------------------------------------------------------------------------------------------------    LABs:     Latest Ref Rng & Units 03/20/2022    4:30 AM 03/19/2022    8:06 PM 03/18/2022    5:00 PM  CBC  WBC 4.0 - 10.5 K/uL 11.3   13.2   15.3    Hemoglobin 13.0 - 17.0 g/dL 11.4   13.8   14.9    Hematocrit 39.0 - 52.0 % 35.9   43.9   47.4    Platelets 150 - 400 K/uL 189   198   211        Latest Ref Rng & Units 03/20/2022    4:30 AM 03/19/2022    8:06 PM 03/18/2022    5:00 PM  CMP  Glucose 70 - 99 mg/dL 235   146   105    BUN 6 - 20 mg/dL 23   27   21     Creatinine 0.61 - 1.24 mg/dL 0.92   1.17   1.04    Sodium 135 - 145 mmol/L 141   140   142    Potassium 3.5 - 5.1 mmol/L 3.5   3.5   4.0    Chloride 98 - 111 mmol/L 104   100   99    CO2 22 - 32 mmol/L 29   31   33  Calcium 8.9 - 10.3 mg/dL 8.4   8.9   9.2    Total Protein 6.5 - 8.1 g/dL 4.9      Total Bilirubin 0.3 - 1.2 mg/dL 0.1      Alkaline Phos 38 - 126 U/L 89      AST 15 - 41 U/L 29      ALT 0 - 44 U/L 64            Micro Results Recent Results (from the past 240 hour(s))  Respiratory (~20 pathogens) panel by PCR     Status: None   Collection Time: 03/13/22  8:04 AM   Specimen: Nasopharyngeal Swab; Respiratory  Result Value Ref Range Status   Adenovirus NOT DETECTED NOT DETECTED Final   Coronavirus 229E NOT DETECTED NOT DETECTED Final    Comment: (NOTE) The Coronavirus on the Respiratory Panel, DOES NOT test for the novel  Coronavirus (2019 nCoV)    Coronavirus HKU1 NOT DETECTED NOT DETECTED Final   Coronavirus NL63 NOT DETECTED NOT DETECTED Final   Coronavirus OC43 NOT DETECTED NOT DETECTED Final   Metapneumovirus NOT DETECTED NOT DETECTED Final   Rhinovirus / Enterovirus NOT DETECTED NOT DETECTED Final   Influenza A NOT DETECTED NOT DETECTED Final   Influenza B NOT DETECTED NOT DETECTED Final   Parainfluenza Virus 1 NOT DETECTED NOT DETECTED Final   Parainfluenza Virus 2 NOT DETECTED NOT DETECTED Final   Parainfluenza Virus 3 NOT DETECTED NOT DETECTED Final   Parainfluenza Virus 4 NOT DETECTED NOT DETECTED Final   Respiratory Syncytial Virus NOT DETECTED NOT DETECTED Final   Bordetella pertussis NOT DETECTED NOT DETECTED Final   Bordetella Parapertussis NOT DETECTED NOT DETECTED Final   Chlamydophila pneumoniae NOT DETECTED NOT DETECTED Final   Mycoplasma pneumoniae NOT DETECTED NOT DETECTED Final    Comment: Performed at [facility], [place]. [address] [place], [place] [address]  Resp Panel by RT-PCR (Flu A&B, Covid) Nasopharyngeal Swab     Status: None   Collection Time: 03/19/22 11:30 PM   Specimen: Nasopharyngeal Swab; Nasopharyngeal(NP) swabs in vial transport medium  Result Value Ref Range Status   SARS Coronavirus 2 by RT PCR NEGATIVE NEGATIVE Final    Comment: (NOTE) SARS-CoV-2 target nucleic acids are NOT DETECTED.  The SARS-CoV-2 RNA is generally detectable in upper respiratory specimens during the acute phase of infection. The lowest concentration of SARS-CoV-2  viral copies this assay can detect is 138 copies/mL. A negative result does not preclude SARS-Cov-2 infection and should not be used as the sole basis for treatment or other patient management decisions. A negative result may occur with  improper specimen collection/handling, submission of specimen other than nasopharyngeal swab, presence of viral mutation(s) within the areas targeted by this assay, and inadequate number of viral copies(<138 copies/mL). A negative result must be combined with clinical observations, patient history, and epidemiological information. The expected result is Negative.  Fact Sheet for Patients:  EntrepreneurPulse.com.au  Fact Sheet for Healthcare Providers:  IncredibleEmployment.be  This test is no t yet approved or cleared by the [place] FDA and  has been authorized for detection and/or diagnosis of SARS-CoV-2 by FDA under an Emergency Use Authorization (EUA). This EUA will remain  in effect (meaning this test can be used) for the duration of the COVID-19 declaration under Section 564(b)(1) of the Act, 21 U.S.C.section 360bbb-3(b)(1), unless the authorization is terminated  or revoked sooner.       Influenza A by PCR NEGATIVE NEGATIVE Final  Influenza B by PCR NEGATIVE NEGATIVE Final    Comment: (NOTE) The Xpert Xpress SARS-CoV-2/FLU/RSV plus assay is intended as an aid in the diagnosis of influenza from Nasopharyngeal swab specimens and should not be used as a sole basis for treatment. Nasal washings and aspirates are unacceptable for Xpert Xpress SARS-CoV-2/FLU/RSV testing.  Fact Sheet for Patients: EntrepreneurPulse.com.au  Fact Sheet for Healthcare Providers: IncredibleEmployment.be  This test is not yet approved or cleared by the [place] FDA and has been authorized for detection and/or diagnosis of SARS-CoV-2 by FDA under an Emergency Use Authorization  (EUA). This EUA will remain in effect (meaning this test can be used) for the duration of the COVID-19 declaration under Section 564(b)(1) of the Act, 21 U.S.C. section 360bbb-3(b)(1), unless the authorization is terminated or revoked.  Performed at [facility], [address] [place], [place] [address]     Radiology Reports DG Chest [place] 1 View  Result Date: 03/19/2022 CLINICAL DATA:  Hemoptysis EXAM: PORTABLE CHEST 1 VIEW COMPARISON:  03/18/2022, CT 03/18/2022 FINDINGS: Emphysema and bronchitic changes. No consolidation, pleural effusion, or pneumothorax. Prominent callus at the right sixth rib fracture accounts for right lower mid lung nodular opacity. Stable cardiomediastinal silhouette. IMPRESSION: Emphysema and bronchitic changes without acute airspace disease Electronically Signed   By: Donavan Foil M.D.   On: 03/19/2022 20:05    SIGNED: Deatra James, MD, FHM. [facility],  Pager (please use [email or web address] to page/text) Please use Epic Secure Chat for non-urgent communication (7AM-7PM)  If 7PM-7AM, please contact night-coverage [email or web address], 03/20/2022, 11:04 AM

## 2022-03-20 NOTE — Evaluation (Signed)
Physical Therapy Evaluation Patient Details Name: Tony Sullivan MRN: [id number] DOB: 09-24-1961 Today's Date: 03/20/2022  History of Present Illness  Tony Sullivan is a 61 y.o. male with medical history significant of chronic respiratory failure with hypoxia secondary to COPD on supplemental oxygen via [place] at 3 LPM, hyperlipidemia, hypertension, GERD, tobacco abuse who presents to the emergency department due to blood in sputum which started about 2 hours PTA.  Patient presented to the emergency department 2 nights ago with complaints of several days of wheezing and shortness of breath, he also complained of chest tightness.  CT scan was done which showed a mass that was reported that he (the patient) was aware of and was following with a pulmonologist.  He was treated for a COPD exacerbation, symptoms improved and patient was discharged home, though, patient did not want to be discharged due to him still having some shortness of breath.  Patient states that after he got home, he noted blood when he coughs and since is still had some shortness of breath, he decided to return to the ED for further evaluation and management.  Patient is a chronic smoker who states that he has cut down a lot on smoking and was trying to quit.  He denies fever, chills, headache, nausea, vomiting, abdominal pain.     Patient was recently admitted from 5/20 to 05/19 due to acute on chronic hypoxemic respiratory failure due to pneumonia with possible aspiration and then later also noted to have PE, he was treated with Levaquin and started on Eliquis.  Oxygen requirement at admission (6 L) was weaned down to baseline oxygen requirement at 3 LPM    Clinical Impression  Patient lying in bed on therapist arrival and initially refused therapy but then agrees to at least try to sit up on the edge of bed.  Supine to sit with extra time and min guard.  Patient wearing his O2 at 4L and states he is worried his O2 saturation will drop but   O2 saturation remains at 97% and about with supine to sit and sitting up on EOB x 2 min.  He performs ankle pumps and LAQs x 5 reps each.  Patient returns to supine from sitting with min guard.  He verbalizes anxiety with all movement. Nursing present at the end of session today.  Patient will benefit from continued skilled therapy interventions during his hospital stay here and at the below recommended venue of care to address deficits and promote optimal function.      Recommendations for follow up therapy are one component of a multi-disciplinary discharge planning process, led by the attending physician.  Recommendations may be updated based on patient status, additional functional criteria and insurance authorization.  Follow Up Recommendations Skilled nursing-short term rehab (<3 hours/day)    Assistance Recommended at Discharge Frequent or constant Supervision/Assistance  Patient can return home with the following  A lot of help with walking and/or transfers;A lot of help with bathing/dressing/bathroom;Help with stairs or ramp for entrance    Equipment Recommendations None recommended by PT  Recommendations for Other Services       Functional Status Assessment Patient has had a recent decline in their functional status and demonstrates the ability to make significant improvements in function in a reasonable and predictable amount of time.     Precautions / Restrictions Restrictions Weight Bearing Restrictions: No      Mobility  Bed Mobility Overal bed mobility: Needs Assistance Bed Mobility: Supine to Sit  Supine to sit: Min guard     General bed mobility comments: takes extra time; nervous Patient Response: Anxious  Transfers Overall transfer level: Needs assistance                 General transfer comment: refuses to transfer to powerchair    Ambulation/Gait               General Gait Details: non ambulatory  Stairs            Wheelchair  Mobility    Modified Rankin (Stroke Patients Only)       Balance Overall balance assessment: Needs assistance Sitting-balance support: Bilateral upper extremity supported, Feet supported Sitting balance-Leahy Scale: Good Sitting balance - Comments: good sitting balance on EOB with therapist min guard; O2 saturation levels remain at 97% and above       Standing balance comment: refused to attempt to stand or stand/pivot                             Pertinent Vitals/Pain Pain Assessment Pain Score: 3  Pain Location: my stomach is tight Pain Descriptors / Indicators: Cramping, Discomfort    Home Living Family/patient expects to be discharged to:: Private residence Living Arrangements: Non-relatives/Friends Available Help at Discharge: Personal care attendant;Friend(s) Type of Home: House Home Access: Ramped entrance       Home Layout: Able to live on main level with bedroom/bathroom Home Equipment: Rolling Walker (2 wheels);Cane - single point;Wheelchair - power;BSC/3in1;Wheelchair - manual Additional Comments: aide available 3hr/day 7 days/week; has 2 roomates    Prior Function Prior Level of Function : Needs assist       Physical Assist : Mobility (physical);ADLs (physical)   ADLs (physical): Bathing;Dressing;Toileting;IADLs Mobility Comments: pt reports he typically performs transfers to/from power chair, does not ambulate       Hand Dominance   Dominant Hand: Right    Extremity/Trunk Assessment   Upper Extremity Assessment Upper Extremity Assessment: Generalized weakness    Lower Extremity Assessment Lower Extremity Assessment: Generalized weakness       Communication   Communication: No difficulties  Cognition Arousal/Alertness: Awake/alert Behavior During Therapy: WFL for tasks assessed/performed Overall Cognitive Status: Within Functional Limits for tasks assessed                                 General Comments:  answered questions appropriately; able to follow commands; initially refused therapy but then agreeable to sit up on EOB        General Comments      Exercises     Assessment/Plan    PT Assessment Patient needs continued PT services  PT Problem List Decreased strength;Decreased activity tolerance;Decreased mobility       PT Treatment Interventions DME instruction;Balance training;Functional mobility training;Patient/family education;Therapeutic activities;Therapeutic exercise;Wheelchair mobility training    PT Goals (Current goals can be found in the Care Plan section)  Acute Rehab PT Goals Patient Stated Goal: return home PT Goal Formulation: With patient Time For Goal Achievement: 03/28/22 Potential to Achieve Goals: Fair    Frequency Min 2X/week     Co-evaluation               AM-PAC PT "6 Clicks" Mobility  Outcome Measure Help needed turning from your back to your side while in a flat bed without using bedrails?: None Help needed moving from lying on your  back to sitting on the side of a flat bed without using bedrails?: A Little Help needed moving to and from a bed to a chair (including a wheelchair)?: A Lot Help needed standing up from a chair using your arms (e.g., wheelchair or bedside chair)?: A Lot Help needed to walk in hospital room?: Total Help needed climbing 3-5 steps with a railing? : Total 6 Click Score: 13    End of Session Equipment Utilized During Treatment: Oxygen Activity Tolerance: Treatment limited secondary to agitation Patient left: in bed;with call bell/phone within reach;with nursing/sitter in room Nurse Communication: Mobility status PT Visit Diagnosis: Muscle weakness (generalized) (M62.81);Pain    Time: 1025-1040 PT Time Calculation (min) (ACUTE ONLY): 15 min   Charges:   PT Evaluation $PT Eval Low Complexity: 1 Low PT Treatments $Therapeutic Activity: 8-22 mins        12:32 PM, 03/20/22 Damesha Lawler Small Torry Istre MPT [facility] [place] [phone]

## 2022-03-20 NOTE — Progress Notes (Signed)
[facility] Hospital Liaison Note  Notified by TOC manager of patient/family request for [facility] palliative services at home after discharge.   [facility] hospital liaison will follow patient for discharge disposition.   Please call with any hospice or outpatient palliative care related questions.   Thank you for the opportunity to participate in this patient's care.   Shanita Wicker, LCSW [facility] Hospital Liaison [phone]  

## 2022-03-20 NOTE — Hospital Course (Signed)
  Tony Sullivan is a 61 y.o. male with medical history significant of chronic respiratory failure with hypoxia secondary to COPD on supplemental oxygen via [place] at 3 LPM, hyperlipidemia, hypertension, GERD, tobacco abuse who presents to the emergency department due to blood in sputum which started about 2 hours PTA.   Patient presented to ED 2 nights ago with complaints of several days of wheezing and shortness of breath, he also complained of chest tightness.  CT scan was done which showed a mass that was reported that he (the patient) was aware of and was following with a pulmonologist.  He was treated for a COPD exacerbation, symptoms improved and patient was discharged home, though, patient did not want to be discharged due to him still having some shortness of breath.  Patient states that after he got home, he noted blood when he coughs and since is still had some shortness of breath, he decided to return to the ED for further evaluation and management. Patient is a chronic smoker who states that he has cut down a lot on smoking and was trying to quit.  He denies fever, chills, headache, nausea, vomiting, abdominal pain.   Patient was recently admitted from 5/20 to 05/19 due to acute on chronic hypoxemic respiratory failure due to pneumonia with possible aspiration and then later also noted to have PE, he was treated with Levaquin and started on Eliquis.  Oxygen requirement at admission (6 L) was weaned down to baseline oxygen requirement at 3 LPM   ED Course:  Pt was intermittently tachypneic, other vital signs are within normal range.  O2 sat was 98-100% on supplemental oxygen 4 LPM.  Work-up in the ED showed normal CBC except for leukocytosis and MCV of 100.7.  BMP was normal except for BUN of 21, magnesium 2.5, troponin x2 was flat at 15.   Influenza A, B, SARS coronavirus 2 was negative. Chest x-ray showed emphysema and bronchitis changes without acute airspace disease CT chest with contrast  showed severe emphysema. Superimposed airway inflammation. Improved airway- impaction within the lower lobes bilaterally.   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 Correlation with endoscopy is recommended. Solumedrol, magnesium and potassium were given, breathing treatment with albuterol was provided and doxycycline was given as well.

## 2022-03-20 NOTE — TOC Initial Note (Signed)
Transition of Care [facility]) - Initial/Assessment Note    Patient Details  Name: Tony Sullivan MRN: [id number] Date of Birth: 07-19-1961  Transition of [facility] CM/SW Contact:    Ihor Gully, LCSW Phone Number: 03/20/2022, 2:33 PM  Clinical Narrative:                 Patient from home, well known to [facility] due to multiple admissions. Considered high risk for readmission. Has aide daily. Has DME. Agreeable to referral to Palliative care services in the home. Discussed providers. Patient referred to [facility] for palliative services.        Patient Goals and CMS Choice        Expected Discharge Plan and Services                                                Prior Living Arrangements/Services                       Activities of Daily Living Home Assistive Devices/Equipment: Wheelchair, Shower chair with back ADL Screening (condition at time of admission) Patient's cognitive ability adequate to safely complete daily activities?: Yes Is the patient deaf or have difficulty hearing?: No Does the patient have difficulty seeing, even when wearing glasses/contacts?: Yes Does the patient have difficulty concentrating, remembering, or making decisions?: No Patient able to express need for assistance with ADLs?: Yes Does the patient have difficulty dressing or bathing?: Yes Independently performs ADLs?: No Communication: Independent Dressing (OT): Needs assistance Is this a change from baseline?: Pre-admission baseline Grooming: Needs assistance Is this a change from baseline?: Pre-admission baseline Feeding: Needs assistance Is this a change from baseline?: Pre-admission baseline Bathing: Needs assistance Is this a change from baseline?: Pre-admission baseline Toileting: Needs assistance Is this a change from baseline?: Pre-admission baseline In/Out Bed: Needs assistance Is this a change from baseline?: Pre-admission baseline Walks in Home: Needs  assistance Is this a change from baseline?: Pre-admission baseline Does the patient have difficulty walking or climbing stairs?: Yes Weakness of Legs: Both Weakness of Arms/Hands: None  Permission Sought/Granted                  Emotional Assessment              Admission diagnosis:  COPD exacerbation ([place]) [J44.1] Malignant neoplasm of left lung, unspecified part of lung ([place]) [C34.92] Patient Active Problem List   Diagnosis Date Noted   Thoracic compression fracture ([place]) 03/20/2022   Hemoptysis 03/20/2022   Pneumonia 03/13/2022   COPD exacerbation ([place]) 02/27/2022   Pedal edema 02/19/2022   CAP (community acquired pneumonia) 01/07/2022   Prolonged QT interval 01/07/2022   Glaucoma 01/07/2022   Elevated troponin 10/27/2021   Hyperglycemia 10/27/2021   Elevated MCV 10/27/2021   Respiratory failure with hypoxia and hypercapnia ([place]) 10/27/2021   Acute exacerbation of chronic obstructive pulmonary disease (COPD) ([place]) 10/02/2021   Hypophosphatemia 10/02/2021   Hypoalbuminemia due to protein-calorie malnutrition ([place]) 10/02/2021   Physical deconditioning 07/26/2021   Numbness and tingling in left arm 05/06/2021   Leukocytosis 05/05/2021   Severe headache 05/05/2021   Mixed hyperlipidemia 05/24/2020   Hypokalemia 05/24/2020   Chest pain 05/23/2020   Bipolar disorder ([place]) 05/23/2020   HNP (herniated nucleus pulposus), cervical 09/06/2019   Abdominal pain    Poor dentition 11/04/2017   Acute  on chronic respiratory failure with hypoxia ([place]) 08/29/2017   ETOH abuse 03/30/2017   Cigarette smoker 03/30/2017   Acute respiratory failure with hypoxia and hypercapnia ([facility]) 03/29/2017   Special screening for malignant neoplasms, colon    Benign neoplasm of ascending colon    Benign neoplasm of descending colon    Hoarseness 08/22/2016   GERD (gastroesophageal reflux disease) 08/22/2016   Atypical chest pain 12/05/2015   Essential hypertension 06/15/2014   Pectoralis  muscle strain 01/11/2014   COPD with acute exacerbation ([place]) 01/10/2014   COPD (chronic obstructive pulmonary disease) ([place]) 01/08/2014   Tobacco abuse 01/08/2014   Cough 01/08/2014   Chronic respiratory failure with hypoxia ([place]) 01/08/2014   PCP:  [name], FNP Pharmacy:   [facility], [address] AT [address] [place] [address] Phone: [phone] Fax: [phone]     Social Determinants of Health (SDOH) Interventions    Readmission Risk Interventions    03/14/2022    3:54 PM 02/28/2022   10:40 AM 09/08/2019    3:22 PM  Readmission Risk Prevention Plan  Transportation Screening Complete Complete Complete  PCP or Specialist Appt within 3-5 Days   Complete  HRI or [place]   Complete  Social Work Consult for [place] Planning/Counseling   Complete  Palliative Care Screening   Not Applicable  Medication Review Press photographer) Complete Complete Complete  PCP or Specialist appointment within 3-5 days of discharge Not Complete    HRI or [place] Complete Complete   SW Recovery Care/Counseling Consult Complete Complete   Palliative Care Screening Not Applicable Not [place] Patient Refused Not Applicable

## 2022-03-20 NOTE — H&P (Signed)
History and Physical    Patient: Tony Sullivan BJY:[id number] DOB: [birth year] DOA: 03/19/2022 DOS: the patient was seen and examined on 03/20/2022 PCP: [name], FNP  Patient coming from: Home  Chief Complaint:  Chief Complaint  Patient presents with   Hemoptysis   HPI: Tony Sullivan is a 61 y.o. male with medical history significant of chronic respiratory failure with hypoxia secondary to COPD on supplemental oxygen via [place] at 3 LPM, hyperlipidemia, hypertension, GERD, tobacco abuse who presents to the emergency department due to blood in sputum which started about 2 hours PTA.  Patient presented to the emergency department 2 nights ago with complaints of several days of wheezing and shortness of breath, he also complained of chest tightness.  CT scan was done which showed a mass that was reported that he (the patient) was aware of and was following with a pulmonologist.  He was treated for a COPD exacerbation, symptoms improved and patient was discharged home, though, patient did not want to be discharged due to him still having some shortness of breath.  Patient states that after he got home, he noted blood when he coughs and since is still had some shortness of breath, he decided to return to the ED for further evaluation and management. Patient is a chronic smoker who states that he has cut down a lot on smoking and was trying to quit.  He denies fever, chills, headache, nausea, vomiting, abdominal pain.  Patient was recently admitted from 5/20 to 05/19 due to acute on chronic hypoxemic respiratory failure due to pneumonia with possible aspiration and then later also noted to have PE, he was treated with Levaquin and started on Eliquis.  Oxygen requirement at admission (6 L) was weaned down to baseline oxygen requirement at 3 LPM  ED Course:  In the emergency department, he was intermittently tachypneic, other vital signs are within normal range.  O2 sat was 98-100% on supplemental  oxygen 4 LPM.  Work-up in the ED showed normal CBC except for leukocytosis and MCV of 100.7.  BMP was normal except for BUN of 21, magnesium 2.5, troponin x2 was flat at 15.  Influenza A, B, SARS coronavirus 2 was negative. Chest x-ray showed emphysema and bronchitis changes without acute airspace disease CT chest with contrast showed severe emphysema. Superimposed airway inflammation. Improved airway impaction within the lower lobes bilaterally.   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 Correlation with endoscopy is recommended. Evidence of withdrawal 24 mg consult was given, magnesium and potassium were given, breathing treatment with albuterol was provided and doxycycline was given as well.  Hospitalist was asked to admit patient for further evaluation and management   Review of Systems: Review of systems as noted in the HPI. All other systems reviewed and are negative.   Past Medical History:  Diagnosis Date   Anxiety    Arthritis    COPD (chronic obstructive pulmonary disease) ([place]) 2004   diagnosed in 2004, no PFT's to date.  Started on home O2 12/2013, after found to be desatting at PCP's office, and referred to pulmonology.   Depression    GERD (gastroesophageal reflux disease)    High blood pressure    Prediabetes    Seasonal allergies    Substance abuse ([place])    Tobacco dependence    Past Surgical History:  Procedure Laterality Date   ANTERIOR CERVICAL DECOMP/DISCECTOMY FUSION N/A 09/06/2019   Procedure: Anterior  Cervical Discecectomy Fusion - Cerivcal Five-Cervical Six;  Surgeon: Kary Kos, MD;  Location: [facility];  Service: Neurosurgery;  Laterality: N/A;  Anterior Cervical Discecectomy Fusion - Cerivcal Five-Cervical Six   ANTERIOR CERVICAL DISCECTOMY  09/06/2019   APPENDECTOMY  1980   COLONOSCOPY WITH PROPOFOL N/A 12/10/2016   Procedure: COLONOSCOPY WITH PROPOFOL;  Surgeon: [name], MD;  Location: [facility];  Service: Endoscopy;  Laterality: N/A;   HIP SURGERY Right    SHOULDER ARTHROSCOPY Right     Social History:  reports that he has been smoking cigarettes. He has a 9.00 pack-year smoking history. He has never used smokeless tobacco. He reports that he does not currently use alcohol after a past usage of about 14.0 - 21.0 standard drinks per week. He reports current drug use. Drugs: Marijuana and Cocaine.   Allergies  Allergen Reactions   Penicillins Anaphylaxis    Has patient had a PCN reaction causing immediate rash, facial/tongue/throat swelling, SOB or lightheadedness with hypotension: Yes Has patient had a PCN reaction causing severe rash involving mucus membranes or skin necrosis: No Has patient had a PCN reaction that required hospitalization No Has patient had a PCN reaction occurring within the last 10 years: No If all of the above answers are "NO", then may proceed with Cephalosporin use.    Levocetirizine Other (See Comments)    Muscle cramps    Family History  Problem Relation Age of Onset   Emphysema Mother    Allergies Mother        "everyone in family"   Asthma Mother        "everyone in family"   Heart disease Mother    Clotting disorder Mother    Cancer Mother    Emphysema Father    Allergies Father    Allergies Son    Seizures Brother    Diabetes Brother      Prior to Admission medications   Medication Sig Start Date End Date Taking? Authorizing Provider  acetaminophen (TYLENOL) 325 MG tablet Take 2 tablets (650 mg total) by mouth every 6 (six) hours as needed for mild pain (or Fever >/= 101). 10/30/21  Yes Emokpae, Courage, MD  albuterol (PROVENTIL) (2.5 MG/3ML) 0.083% nebulizer solution Take 3 mLs (2.5 mg total) by nebulization every 4 (four) hours as needed for wheezing or shortness of breath. 12/12/21 12/12/22 Yes [name], MD  albuterol (VENTOLIN HFA) 108 (90 Base) MCG/ACT inhaler Inhale 2 puffs into the lungs  every 6 (six) hours as needed for wheezing or shortness of breath. 03/15/22  Yes [name], Pratik D, DO  apixaban (ELIQUIS) 5 MG TABS tablet Take 2 tablets (10 mg total) by mouth 2 (two) times daily for 7 days. 03/15/22 03/22/22 Yes Shah, Pratik D, DO  apixaban (ELIQUIS) 5 MG TABS tablet Take 1 tablet (5 mg total) by mouth 2 (two) times daily. 03/22/22 05/21/22 Yes Shah, Pratik D, DO  atorvastatin (LIPITOR) 40 MG tablet TAKE ONE TABLET BY MOUTH DAILY Patient taking differently: Take 40 mg by mouth daily. 07/06/20  Yes [name], MD  benztropine (COGENTIN) 0.5 MG tablet Take 0.5 mg by mouth at bedtime.   Yes [provider]  Budeson-Glycopyrrol-Formoterol (BREZTRI AEROSPHERE) 160-9-4.8 MCG/ACT AERO Inhale 2 puffs into the lungs in the morning and at bedtime. 12/12/21  Yes [name], MD  cetirizine (ZYRTEC) 10 MG tablet Take 10 mg by mouth daily.   Yes [provider]  diclofenac Sodium (VOLTAREN) 1 % GEL Apply 2  g topically 4 (four) times daily as needed (pain).   Yes [provider]  dorzolamide (TRUSOPT) 2 % ophthalmic solution Place 1 drop into the left eye 2 (two) times daily. 12/12/20  Yes [provider]  gabapentin (NEURONTIN) 100 MG capsule Take 1 capsule (100 mg total) by mouth 2 (two) times daily. 03/15/22 04/14/22 Yes Shah, Pratik D, DO  guaiFENesin 200 MG tablet Take 2 tablets (400 mg total) by mouth every 6 (six) hours as needed for cough or to loosen phlegm. 03/02/22  Yes Tat, Shanon Brow, MD  hydrochlorothiazide (HYDRODIURIL) 25 MG tablet Take 0.5 tablets (12.5 mg total) by mouth daily. 10/30/21  Yes [name], MD  hydrOXYzine (ATARAX) 25 MG tablet Take 1 tablet (25 mg total) by mouth 3 (three) times daily as needed for anxiety or nausea. 10/30/21  Yes Emokpae, Courage, MD  latanoprost (XALATAN) 0.005 % ophthalmic solution Place 1 drop into both eyes at bedtime. 05/24/19  Yes [provider]  losartan (COZAAR) 100 MG tablet Take 100 mg by mouth daily.    Yes [provider]  loxapine (LOXITANE) 10 MG capsule Take 10 mg by mouth at bedtime.    Yes [provider]  mirtazapine (REMERON) 30 MG tablet Take 1 tablet (30 mg total) by mouth at bedtime. 10/30/21  Yes Emokpae, Courage, MD  Multiple Vitamin (MULTIVITAMIN WITH MINERALS) TABS tablet Take 1 tablet by mouth daily. 10/31/21  Yes Emokpae, Courage, MD  OXYGEN Inhale 3-4 L into the lungs as directed. 3-4 liters with sleep and exertion as needed for low oxygen   Yes [provider]  pantoprazole (PROTONIX) 40 MG tablet Take 1 tablet (40 mg total) by mouth daily. 10/30/21  Yes Emokpae, Courage, MD  polyethylene glycol (MIRALAX / GLYCOLAX) 17 g packet TAKE 17 GRAMS BY MOUTH DAILY Patient taking differently: Take 17 g by mouth daily as needed for mild constipation. 03/09/20  Yes [name], MD  predniSONE (DELTASONE) 20 MG tablet Take 1 tablet (20 mg total) by mouth daily. 03/15/22 04/14/22 Yes Shah, Pratik D, DO  risperiDONE (RISPERDAL) 2 MG tablet Take 1 tablet (2 mg total) by mouth at bedtime. 10/30/21  Yes Emokpae, Courage, MD  senna-docusate (SENOKOT-S) 8.6-50 MG tablet Take 2 tablets by mouth at bedtime. Patient taking differently: Take 2 tablets by mouth at bedtime as needed for mild constipation. 10/30/21  Yes [name], MD  simethicone (MYLICON) 80 MG chewable tablet Chew 1 tablet (80 mg total) by mouth 4 (four) times daily as needed for flatulence. 03/15/22  Yes [name], Pratik D, DO    Physical Exam: BP (!) 156/98   Pulse 91   Temp 98.2 F (36.8 C)   Resp 20   Ht 5\' 11"  (1.803 m)   Wt 65.8 kg   SpO2 99%   BMI 20.22 kg/m   General: 61 y.o. year-old male well developed well nourished in no acute distress.  Alert and oriented x3. HEENT: NCAT, EOMI Neck: Supple, trachea medial Cardiovascular: Regular rate and rhythm with no rubs or gallops.  No thyromegaly or JVD noted.  No lower extremity edema. 2/4 pulses in all 4 extremities. Respiratory: Tachypnea.  Bilateral  decreased breath sounds with mild expiratory wheezes Abdomen: Soft, nontender nondistended with normal bowel sounds x4 quadrants. Muskuloskeletal: No cyanosis, clubbing or edema noted bilaterally Neuro: CN II-XII intact, strength 5/5 x 4, sensation, reflexes intact Skin: No ulcerative lesions noted or rashes Psychiatry: Judgement and insight appear normal. Mood is appropriate for condition and setting  Labs on Admission:  Basic Metabolic Panel: Recent Labs  Lab 03/13/22 0312 03/14/22 0340 03/15/22 0342 03/18/22 1700 03/19/22 2006  NA 142 140 140 142 140  K 2.9* 3.5 3.3* 4.0 3.5  CL 98 99 101 99 100  CO2 36* 34* 32 33* 31  GLUCOSE 96 126* 113* 105* 146*  BUN 17 17 11  21* 27*  CREATININE 0.83 0.88 0.80 1.04 1.17  CALCIUM 8.6* 8.4* 8.4* 9.2 8.9  MG  --  2.3  --   --  2.5*   Liver Function Tests: No results for input(s): AST, ALT, ALKPHOS, BILITOT, PROT, ALBUMIN in the last 168 hours. No results for input(s): LIPASE, AMYLASE in the last 168 hours. No results for input(s): AMMONIA in the last 168 hours. CBC: Recent Labs  Lab 03/13/22 0312 03/14/22 0340 03/15/22 0342 03/18/22 1700 03/19/22 2006  WBC 15.1* 12.4* 12.0* 15.3* 13.2*  NEUTROABS  --   --   --   --  11.4*  HGB 13.3 11.9* 12.2* 14.9 13.8  HCT 42.2 39.3 39.2 47.4 43.9  MCV 101.2* 101.0* 100.0 100.4* 100.7*  PLT 218 202 185 211 198   Cardiac Enzymes: No results for input(s): CKTOTAL, CKMB, CKMBINDEX, TROPONINI in the last 168 hours.  BNP (last 3 results) Recent Labs    01/25/22 0540 02/19/22 1013 03/13/22 0312  BNP 83.0 72.0 48.0    ProBNP (last 3 results) No results for input(s): PROBNP in the last 8760 hours.  CBG: No results for input(s): GLUCAP in the last 168 hours.  Radiological Exams on Admission: DG Chest 1 View  Result Date: 03/18/2022 CLINICAL DATA:  Chest and abdominal pain. EXAM: CHEST  1 VIEW COMPARISON:  CT chest dated Mar 13, 2022 FINDINGS: The heart size and mediastinal  contours are within normal limits. Emphysematous changes of the lungs. Focal consolidation or appreciable pleural effusion. Callus formation of the prior right posterior sixth rib fracture. IMPRESSION: 1. Emphysematous changes without evidence of acute cardiopulmonary process. 2.  Callus formation of the prior right sixth rib fracture. Electronically Signed   By: Keane Police D.O.   On: 03/18/2022 17:05   CT Chest W Contrast  Result Date: 03/19/2022 CLINICAL DATA:  Respiratory illness, nondiagnostic xray. Chest pain. EXAM: CT CHEST WITH CONTRAST TECHNIQUE: Multidetector CT imaging of the chest was performed during intravenous contrast administration. RADIATION DOSE REDUCTION: This exam was performed according to the departmental dose-optimization program which includes automated exposure control, adjustment of the mA and/or kV according to patient size and/or use of iterative reconstruction technique. CONTRAST:  15mL OMNIPAQUE IOHEXOL 300 MG/ML  SOLN COMPARISON:  03/13/2022 FINDINGS: Cardiovascular: Moderate multi-vessel coronary artery calcification. Global cardiac size within normal limits. No pericardial effusion. Central pulmonary arteries are enlarged in keeping with changes of pulmonary arterial hypertension. Mild atherosclerotic calcification within the thoracic aorta. No aortic aneurysm. Mediastinum/Nodes: No enlarged mediastinal, hilar, or axillary lymph nodes. Thyroid gland, trachea, and esophagus demonstrate no significant findings. Lungs/Pleura: Severe emphysema. Superimposed airway inflammation again noted with bronchial wall thickening. Airway impaction within the lower lobes has improved in the interval since prior examination. Endobronchial mass is again identified within the left upper lobe are pulmonary bronchus extending peripherally into multiple segmental and subsegmental bronchi of the left upper lobe. No pneumothorax or pleural effusion. Upper Abdomen: No acute abnormality.  Musculoskeletal: Stable mild compression deformities of T5, T8, T9-T10, T11, and L1. No acute bone abnormality. IMPRESSION: Severe emphysema. Superimposed airway inflammation. Improved airway impaction within the lower lobes bilaterally. Stable endobronchial mass  within the a left upper lobar pulmonary bronchus with extension to multiple peripheral segmental bronchi. Correlation with endoscopy is recommended. Moderate multi-vessel coronary artery calcification. Morphologic changes in keeping with pulmonary arterial hypertension. Numerous thoracic compression deformities, stable since prior examination. Aortic Atherosclerosis (ICD10-I70.0) and Emphysema (ICD10-J43.9). Electronically Signed   By: Fidela Salisbury M.D.   On: 03/19/2022 00:18   DG Chest Port 1 View  Result Date: 03/19/2022 CLINICAL DATA:  Hemoptysis EXAM: PORTABLE CHEST 1 VIEW COMPARISON:  03/18/2022, CT 03/18/2022 FINDINGS: Emphysema and bronchitic changes. No consolidation, pleural effusion, or pneumothorax. Prominent callus at the right sixth rib fracture accounts for right lower mid lung nodular opacity. Stable cardiomediastinal silhouette. IMPRESSION: Emphysema and bronchitic changes without acute airspace disease Electronically Signed   By: Donavan Foil M.D.   On: 03/19/2022 20:05    EKG: I independently viewed the EKG done and my findings are as followed: Normal sinus rhythm at a rate of 89 bpm with LAFB  Assessment/Plan Present on Admission:  COPD exacerbation ([place])  Active Problems:   COPD exacerbation ([place])  Acute on chronic respiratory failure with hypoxia secondary to COPD exacerbation Continue duo nebs, Mucinex, Dulera, Solu-Medrol, doxycycline. 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   Correlation with endoscopy is recommended Patient will be temporarily placed n.p.o. in anticipation for possible bronchoscopy in the morning Pulmonology will be consulted and we shall await further recommendations  Elevated MCV MCV 100.7 Folate levels and vitamin B12 levels will be checked  Prior Thoracic compression fracture Stable, this occurred in 2019  Essential hypertension Continue losartan  GERD Continue Protonix  Mixed hyperlipidemia Continue Lipitor  Pulmonary embolism Temporarily hold off on Eliquis in anticipation for possible bronchoscopy by pulmonologist   Tobacco abuse Counseled on tobacco abuse cessation, patient states that he is working on cessation   DVT prophylaxis: SCDs  Code Status: Full code  Consults: Pulmonology  Family Communication: None at bedside  Severity of Illness: The appropriate patient status for this patient is INPATIENT. Inpatient status is judged to be reasonable and necessary in order to provide the required intensity of service to ensure the patient's safety. The patient's presenting symptoms, physical exam findings, and initial radiographic and laboratory data in the context of their chronic comorbidities is felt to place them at high risk for further clinical deterioration. Furthermore, it is not anticipated that the patient will be medically stable for discharge from the hospital within 2 midnights of admission.   * I certify that at the point of admission it is my clinical judgment that the patient will require inpatient hospital care spanning beyond 2 midnights from the point of admission due to high intensity of service, high risk for further deterioration and high frequency of surveillance required.*  Author: Bernadette Hoit, DO 03/20/2022 12:28 AM  For on call review  [email or web address].

## 2022-03-20 NOTE — ED Notes (Signed)
Report given to [place], RN on 300

## 2022-03-20 NOTE — Plan of Care (Signed)
  Problem: Acute Rehab PT Goals(only PT should resolve) Goal: Pt Will Go Supine/Side To Sit Outcome: Progressing Flowsheets (Taken 03/20/2022 1233) Pt will go Supine/Side to Sit: with supervision Goal: Patient Will Transfer Sit To/From Stand Outcome: Progressing Flowsheets (Taken 03/20/2022 1233) Patient will transfer sit to/from stand: with minimal assist Goal: Pt Will Transfer Bed To Chair/Chair To Bed Outcome: Progressing Flowsheets (Taken 03/20/2022 1233) Pt will Transfer Bed to Chair/Chair to Bed: with min assist

## 2022-03-21 ENCOUNTER — Inpatient Hospital Stay (HOSPITAL_COMMUNITY): Payer: Medicaid Other

## 2022-03-21 DIAGNOSIS — J9621 Acute and chronic respiratory failure with hypoxia: Secondary | ICD-10-CM | POA: Diagnosis not present

## 2022-03-21 DIAGNOSIS — J441 Chronic obstructive pulmonary disease with (acute) exacerbation: Secondary | ICD-10-CM | POA: Diagnosis not present

## 2022-03-21 DIAGNOSIS — R042 Hemoptysis: Secondary | ICD-10-CM | POA: Diagnosis not present

## 2022-03-21 LAB — BLOOD GAS, ARTERIAL
Acid-Base Excess: 9.7 mmol/L — ABNORMAL HIGH (ref 0.0–2.0)
Bicarbonate: 35.4 mmol/L — ABNORMAL HIGH (ref 20.0–28.0)
Drawn by: 2733
FIO2: 36 %
O2 Saturation: 98.9 %
Patient temperature: 36.4
pCO2 arterial: 50 mmHg — ABNORMAL HIGH (ref 32–48)
pH, Arterial: 7.46 — ABNORMAL HIGH (ref 7.35–7.45)
pO2, Arterial: 85 mmHg (ref 83–108)

## 2022-03-21 IMAGING — DX DG CHEST 1V PORT
2 series · 2 of 2 positions shown · non-contrast
Comparison: [DATE]

CLINICAL DATA: Short of breath and weakness

EXAM:
PORTABLE CHEST 1 VIEW

[chest ap (1 of 2)]
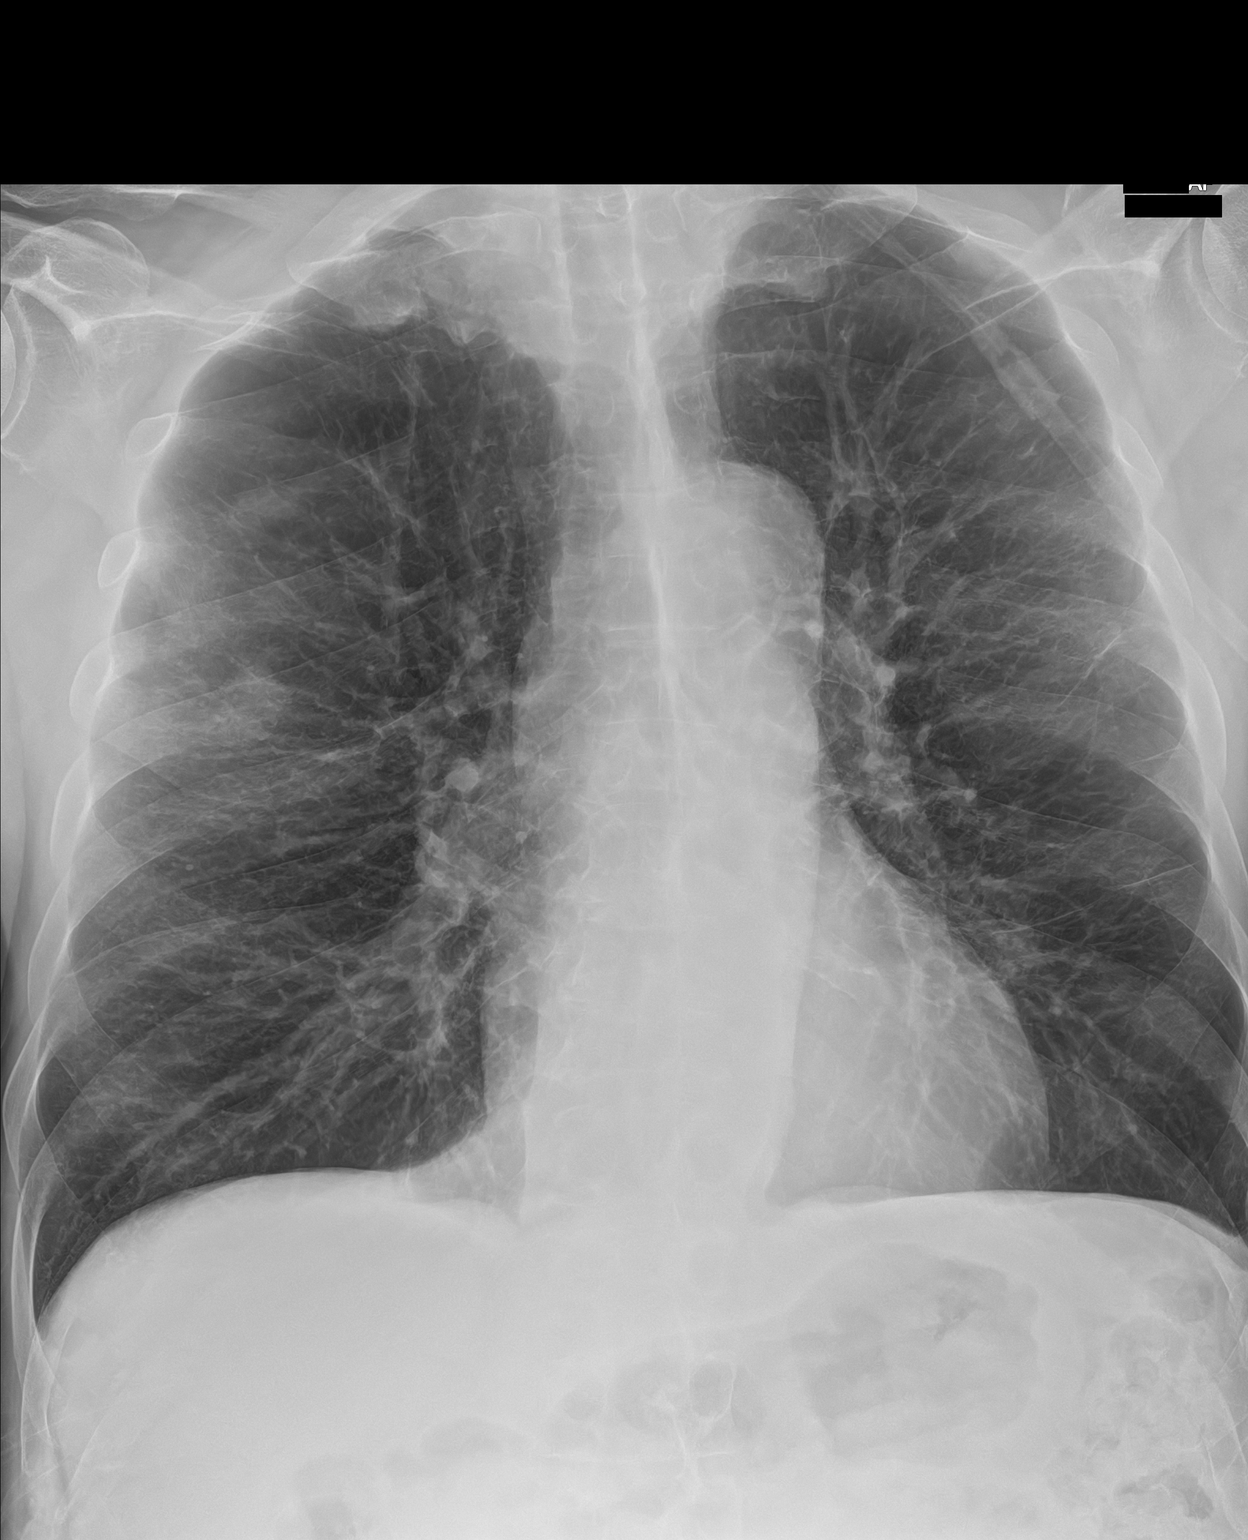

[chest ap (2 of 2)]
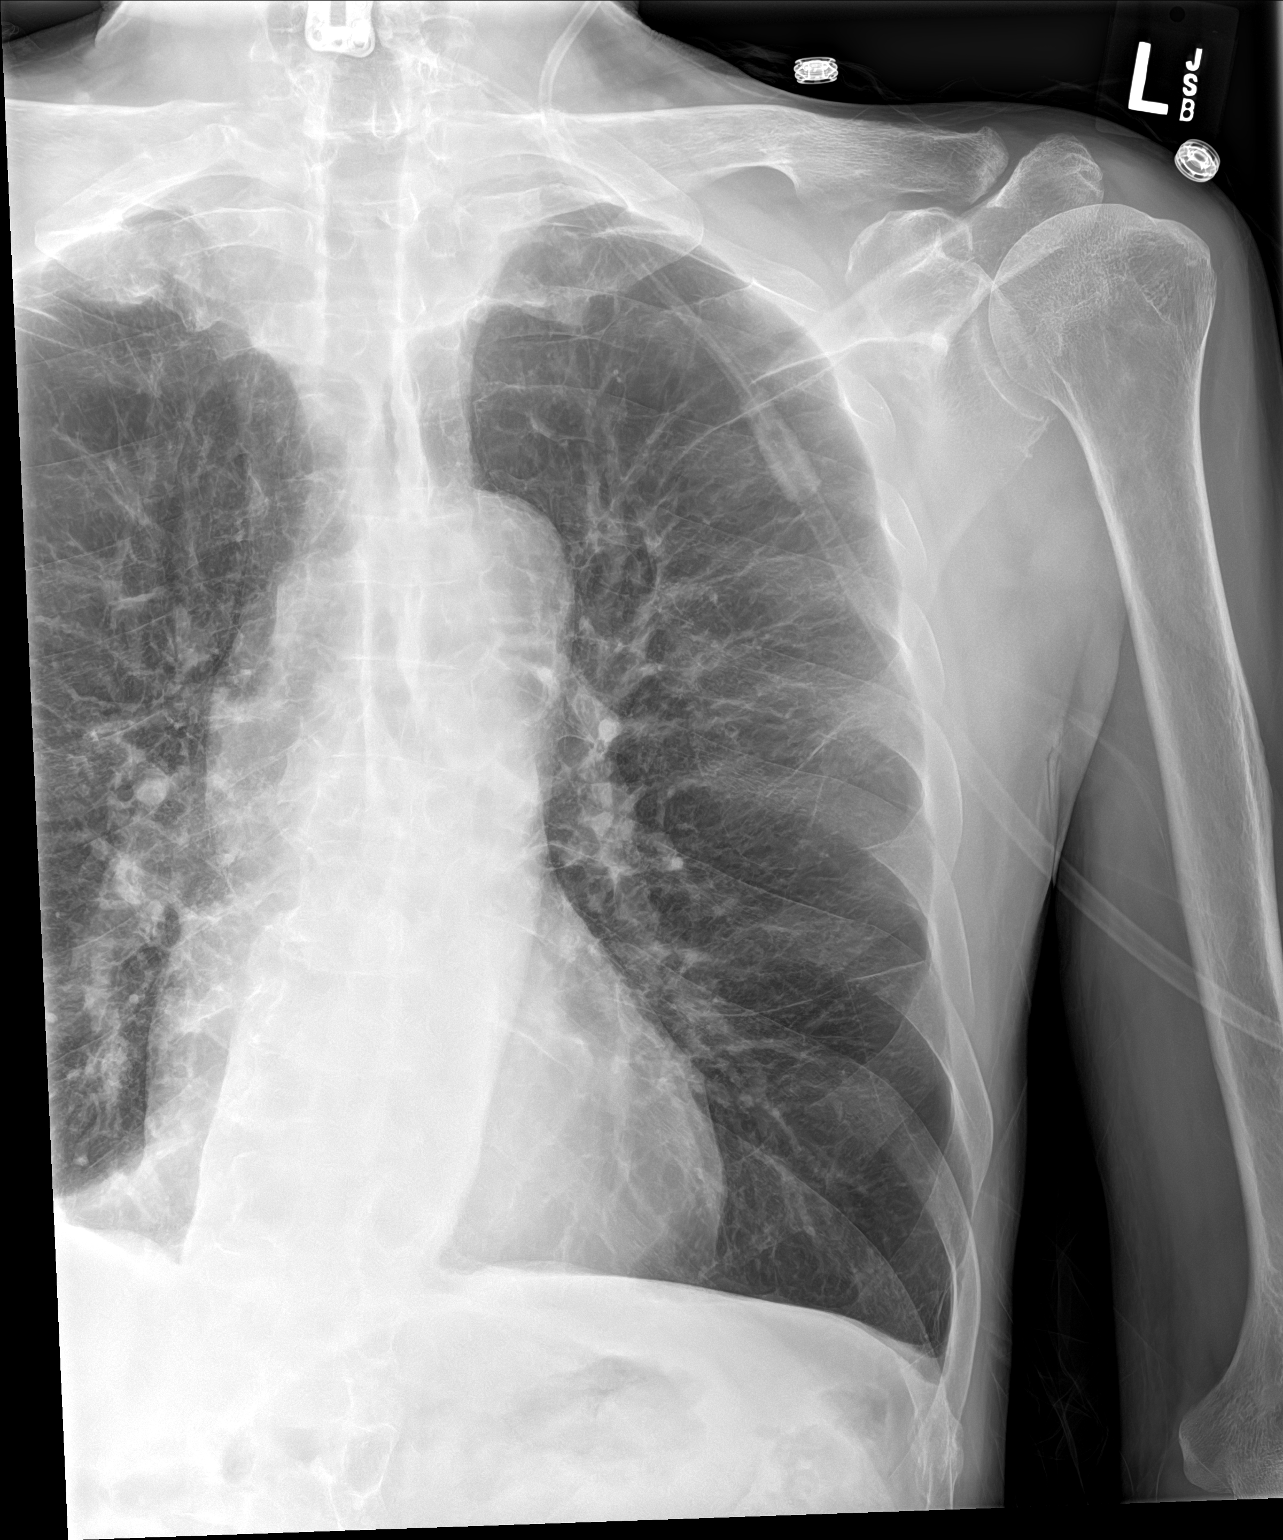

[2 of 2 positions shown; findings below may reference images not displayed]

FINDINGS: COPD and emphysema. Negative for heart failure or edema. No
infiltrate or effusion

Nodular density in the right upper lobe corresponds to healing rib
fracture as noted on CT. Second nodule superior to the rib callus is
also noted and not seen on the prior chest x-ray. No nodule seen in
the lungs on the prior CT of [DATE].
IMPRESSION: COPD and emphysema.  No acute infiltrate or effusion.

## 2022-03-21 MED ORDER — FUROSEMIDE 10 MG/ML IJ SOLN
40.0000 mg | Freq: Once | INTRAMUSCULAR | Status: AC
Start: 2022-03-21 — End: 2022-03-21
  Administered 2022-03-21: 40 mg via INTRAVENOUS
  Filled 2022-03-21: qty 4

## 2022-03-21 MED ORDER — POLYETHYLENE GLYCOL 3350 17 G PO PACK
17.0000 g | PACK | Freq: Every day | ORAL | Status: DC
  Administered 2022-03-21 – 2022-03-24 (×2): 17 g via ORAL
  Filled 2022-03-21 (×3): qty 1

## 2022-03-21 MED ORDER — PREDNISONE 20 MG PO TABS
50.0000 mg | ORAL_TABLET | Freq: Every day | ORAL | Status: DC
  Administered 2022-03-22 – 2022-03-24 (×3): 50 mg via ORAL
  Filled 2022-03-21 (×4): qty 3

## 2022-03-21 MED ORDER — BUDESONIDE 0.25 MG/2ML IN SUSP
0.2500 mg | Freq: Two times a day (BID) | RESPIRATORY_TRACT | Status: DC
  Administered 2022-03-21 – 2022-03-25 (×8): 0.25 mg via RESPIRATORY_TRACT
  Filled 2022-03-21 (×8): qty 2

## 2022-03-21 MED ORDER — METHYLPREDNISOLONE SODIUM SUCC 40 MG IJ SOLR
40.0000 mg | Freq: Once | INTRAMUSCULAR | Status: AC
  Administered 2022-03-21: 40 mg via INTRAVENOUS
  Filled 2022-03-21: qty 1

## 2022-03-21 NOTE — H&P (View-Only) (Signed)
NAME:  Tony Sullivan, MRN:  [id number], DOB:  11/15/1960, LOS: 1 ADMISSION DATE:  03/19/2022, CONSULTATION DATE:  03/21/2022  REFERRING MD:  shahmehdi, CHIEF COMPLAINT: Hemoptysis, abnormal CT  History of Present Illness:  61 year old smoker with COPD and chronic respiratory failure on 3 L oxygen at home.  He had repeated hospital admissions for COPD exacerbations and has had multiple CT scans over the past few months. He presented to the ED with hemoptysis/20 and had a CT scan which showed opacification in the left upper lobe.  He was subsequently admitted twice and treated for COPD exacerbation on 4/25 and 5/3. On 5/17 he was again admitted and this time treated for an aspiration pneumonia, CT again showed opacification of the left upper lobe and segmental PE for which she was started on Eliquis and also treated with Levaquin. He was again admitted 5/23 for blood-tinged sputum x1 and shortness of breath.  CT chest again demonstrated opacification in suggested endobronchial mass in the left upper lobe. Hence PCCM consulted.  He is worried about the possibility of lung malignancy. Baseline uses wheelchair due to chronic shortness of breath   Pertinent  Medical History  Tobacco abuse Hypertension GERD  Significant Hospital Events: Including procedures, antibiotic start and stop dates in addition to other pertinent events   CTA chest 09/2021 bronchial wall thickening, patent airways CTA chest 02/14/2022 segmental airway opacification in the left upper lobe CTA chest 03/13/2022 right upper lobe segmental PE, airway obstruction/impaction left upper lobe segmental main bronchus with mucous plug/debris 03/19/2022 CT chest with contrast -endobronchial mass in LUL, extending into peripheral segmental bronchi  Interim History / Subjective:  Reports 2 episodes of blood-tinged sputum in the past 2 months Breathing has been poor  Objective   Blood pressure (!) 120/93, pulse 97, temperature (!) 97.4  F (36.3 C), temperature source Oral, resp. rate 20, height 5\' 11"  (1.803 m), weight 65.8 kg, SpO2 100 %.        Intake/Output Summary (Last 24 hours) at 03/21/2022 1411 Last data filed at 03/21/2022 7846 Gross per 24 hour  Intake 1630.88 ml  Output 1150 ml  Net 480.88 ml   Filed Weights   03/20/22 0128  Weight: 65.8 kg    Examination: General: Chronically ill-appearing man, sitting up in bed, no distress HENT: No JVD, mild pallor, no icterus Lungs: Barrel chest, decreased breath sounds bilateral faint expiratory rhonchi left lung Cardiovascular: S1-S2 regular, no murmur Abdomen: Soft, nontender, no organomegaly Extremities: No deformity, 1+ bipedal edema Neuro: Alert, interactive, nonfocal, no asterixis GU: Clear urine  Resolved Hospital Problem list     Assessment & Plan:   Chronic respiratory failure with hypoxia and hypercarbia Underlying severe COPD Hemoptysis New finding of pulmonary embolism 5/17, on Eliquis  -Finding of left upper lobe airway opacification is new since episode of hemoptysis in April.  Prior to that CT scan from December shows clear airways.  Given acute appearance it is more likely that this is related to hemoptysis and blood in airway or mucous plug, however only way to definitely rule out malignancy would be by bronchoscopy.  I discussed risk of procedure including that of respiratory failure and prolonged mechanical ventilation since he has severe advanced COPD and chronic respiratory failure. I discussed alternative of serial follow-up imaging but it seems that this airway opacification has been present since April. He would like to discuss with his family further  -I have tentatively arranged for bronchoscopy under general anesthesia on 5/26 at 1:30 PM  Best Practice (right click and "Reselect all SmartList Selections" daily)   Per primary team Code Status:  full code Last date of multidisciplinary goals of care discussion [NA]  Labs    CBC: Recent Labs  Lab 03/15/22 0342 03/18/22 1700 03/19/22 2006 03/20/22 0430  WBC 12.0* 15.3* 13.2* 11.3*  NEUTROABS  --   --  11.4*  --   HGB 12.2* 14.9 13.8 11.4*  HCT 39.2 47.4 43.9 35.9*  MCV 100.0 100.4* 100.7* 101.1*  PLT 185 211 198 656    Basic Metabolic Panel: Recent Labs  Lab 03/15/22 0342 03/18/22 1700 03/19/22 2006 03/20/22 0430  NA 140 142 140 141  K 3.3* 4.0 3.5 3.5  CL 101 99 100 104  CO2 32 33* 31 29  GLUCOSE 113* 105* 146* 235*  BUN 11 21* 27* 23*  CREATININE 0.80 1.04 1.17 0.92  CALCIUM 8.4* 9.2 8.9 8.4*  MG  --   --  2.5* 2.6*  PHOS  --   --   --  2.2*   GFR: Estimated Creatinine Clearance: 81.5 mL/min (by C-G formula based on SCr of 0.92 mg/dL). Recent Labs  Lab 03/15/22 0342 03/18/22 1700 03/19/22 2006 03/20/22 0430  WBC 12.0* 15.3* 13.2* 11.3*    Liver Function Tests: Recent Labs  Lab 03/20/22 0430  AST 29  ALT 64*  ALKPHOS 89  BILITOT 0.1*  PROT 4.9*  ALBUMIN 2.5*   No results for input(s): LIPASE, AMYLASE in the last 168 hours. No results for input(s): AMMONIA in the last 168 hours.  ABG    Component Value Date/Time   PHART 7.46 (H) 03/21/2022 1208   PCO2ART 50 (H) 03/21/2022 1208   PO2ART 85 03/21/2022 1208   HCO3 35.4 (H) 03/21/2022 1208   TCO2 30 05/05/2021 0244   ACIDBASEDEF 2.0 09/30/2018 0939   O2SAT 98.9 03/21/2022 1208     Coagulation Profile: Recent Labs  Lab 03/19/22 2041  INR 1.2    Cardiac Enzymes: No results for input(s): CKTOTAL, CKMB, CKMBINDEX, TROPONINI in the last 168 hours.  HbA1C: Hgb A1c MFr Bld  Date/Time Value Ref Range Status  05/05/2021 02:29 AM 6.1 (H) 4.8 - 5.6 % Final    Comment:    (NOTE) Pre diabetes:          5.7%-6.4%  Diabetes:              >6.4%  Glycemic control for   <7.0% adults with diabetes     CBG: No results for input(s): GLUCAP in the last 168 hours.  Review of Systems:   Blood-tinged sputum x2 Shortness of breath Dry cough Chest tightness  Past  Medical History:  He,  has a past medical history of Anxiety, Arthritis, COPD (chronic obstructive pulmonary disease) ([place]) (2004), Depression, GERD (gastroesophageal reflux disease), High blood pressure, Prediabetes, Seasonal allergies, Substance abuse ([place]), and Tobacco dependence.   Surgical History:   Past Surgical History:  Procedure Laterality Date   ANTERIOR CERVICAL DECOMP/DISCECTOMY FUSION N/A 09/06/2019   Procedure: Anterior Cervical Discecectomy Fusion - Cerivcal Five-Cervical Six;  Surgeon: Kary Kos, MD;  Location: [facility];  Service: Neurosurgery;  Laterality: N/A;  Anterior Cervical Discecectomy Fusion - Cerivcal Five-Cervical Six   ANTERIOR CERVICAL DISCECTOMY  09/06/2019   APPENDECTOMY  1980   COLONOSCOPY WITH PROPOFOL N/A 12/10/2016   Procedure: COLONOSCOPY WITH PROPOFOL;  Surgeon: [name], MD;  Location: [facility];  Service: Endoscopy;  Laterality: N/A;   HIP SURGERY Right    SHOULDER ARTHROSCOPY Right  Social History:   reports that he has been smoking cigarettes. He has a 9.00 pack-year smoking history. He has never used smokeless tobacco. He reports that he does not currently use alcohol after a past usage of about 14.0 - 21.0 standard drinks per week. He reports current drug use. Drugs: Marijuana and Cocaine.   Family History:  His family history includes Allergies in his father, mother, and son; Asthma in his mother; Cancer in his mother; Clotting disorder in his mother; Diabetes in his brother; Emphysema in his father and mother; Heart disease in his mother; Seizures in his brother.   Allergies Allergies  Allergen Reactions   Penicillins Anaphylaxis    Has patient had a PCN reaction causing immediate rash, facial/tongue/throat swelling, SOB or lightheadedness with hypotension: Yes Has patient had a PCN reaction causing severe rash involving mucus membranes or skin necrosis: No Has patient had a PCN reaction that required hospitalization No Has patient  had a PCN reaction occurring within the last 10 years: No If all of the above answers are "NO", then may proceed with Cephalosporin use.    Levocetirizine Other (See Comments)    Muscle cramps     Home Medications  Prior to Admission medications   Medication Sig Start Date End Date Taking? Authorizing Provider  acetaminophen (TYLENOL) 325 MG tablet Take 2 tablets (650 mg total) by mouth every 6 (six) hours as needed for mild pain (or Fever >/= 101). 10/30/21  Yes Emokpae, Courage, MD  albuterol (PROVENTIL) (2.5 MG/3ML) 0.083% nebulizer solution Take 3 mLs (2.5 mg total) by nebulization every 4 (four) hours as needed for wheezing or shortness of breath. 12/12/21 12/12/22 Yes [name], MD  albuterol (VENTOLIN HFA) 108 (90 Base) MCG/ACT inhaler Inhale 2 puffs into the lungs every 6 (six) hours as needed for wheezing or shortness of breath. 03/15/22  Yes [name], Pratik D, DO  apixaban (ELIQUIS) 5 MG TABS tablet Take 2 tablets (10 mg total) by mouth 2 (two) times daily for 7 days. 03/15/22 03/22/22 Yes Shah, Pratik D, DO  atorvastatin (LIPITOR) 40 MG tablet TAKE ONE TABLET BY MOUTH DAILY Patient taking differently: Take 40 mg by mouth daily. 07/06/20  Yes [name], MD  benztropine (COGENTIN) 0.5 MG tablet Take 0.5 mg by mouth at bedtime.   Yes [provider]  Budeson-Glycopyrrol-Formoterol (BREZTRI AEROSPHERE) 160-9-4.8 MCG/ACT AERO Inhale 2 puffs into the lungs in the morning and at bedtime. 12/12/21  Yes [name], MD  cetirizine (ZYRTEC) 10 MG tablet Take 10 mg by mouth daily.   Yes [provider]  diclofenac Sodium (VOLTAREN) 1 % GEL Apply 2 g topically 4 (four) times daily as needed (pain).   Yes [provider]  dorzolamide (TRUSOPT) 2 % ophthalmic solution Place 1 drop into the left eye 2 (two) times daily. 12/12/20  Yes [provider]  gabapentin (NEURONTIN) 100 MG capsule Take 1 capsule (100 mg total) by mouth 2 (two) times daily. 03/15/22 04/14/22  Yes Shah, Pratik D, DO  guaiFENesin 200 MG tablet Take 2 tablets (400 mg total) by mouth every 6 (six) hours as needed for cough or to loosen phlegm. 03/02/22  Yes Tat, Shanon Brow, MD  hydrochlorothiazide (HYDRODIURIL) 25 MG tablet Take 0.5 tablets (12.5 mg total) by mouth daily. 10/30/21  Yes [name], MD  hydrOXYzine (ATARAX) 25 MG tablet Take 1 tablet (25 mg total) by mouth 3 (three) times daily as needed for anxiety or nausea. 10/30/21  Yes [name], MD  latanoprost (XALATAN) 0.005 % ophthalmic solution Place 1 drop into both eyes at bedtime. 05/24/19  Yes [provider]  losartan (COZAAR) 100 MG tablet Take 100 mg by mouth daily.   Yes [provider]  loxapine (LOXITANE) 10 MG capsule Take 10 mg by mouth at bedtime.    Yes [provider]  mirtazapine (REMERON) 30 MG tablet Take 1 tablet (30 mg total) by mouth at bedtime. 10/30/21  Yes Emokpae, Courage, MD  Multiple Vitamin (MULTIVITAMIN WITH MINERALS) TABS tablet Take 1 tablet by mouth daily. 10/31/21  Yes Emokpae, Courage, MD  OXYGEN Inhale 3-4 L into the lungs as directed. 3-4 liters with sleep and exertion as needed for low oxygen   Yes [provider]  pantoprazole (PROTONIX) 40 MG tablet Take 1 tablet (40 mg total) by mouth daily. 10/30/21  Yes Emokpae, Courage, MD  polyethylene glycol (MIRALAX / GLYCOLAX) 17 g packet TAKE 17 GRAMS BY MOUTH DAILY Patient taking differently: Take 17 g by mouth daily as needed for mild constipation. 03/09/20  Yes [name], MD  predniSONE (DELTASONE) 20 MG tablet Take 1 tablet (20 mg total) by mouth daily. 03/15/22 04/14/22 Yes Shah, Pratik D, DO  risperiDONE (RISPERDAL) 2 MG tablet Take 1 tablet (2 mg total) by mouth at bedtime. 10/30/21  Yes Emokpae, Courage, MD  senna-docusate (SENOKOT-S) 8.6-50 MG tablet Take 2 tablets by mouth at bedtime. Patient taking differently: Take 2 tablets by mouth at bedtime as needed for mild constipation. 10/30/21  Yes [name], MD   simethicone (MYLICON) 80 MG chewable tablet Chew 1 tablet (80 mg total) by mouth 4 (four) times daily as needed for flatulence. 03/15/22  Yes [name], Pratik D, DO  apixaban (ELIQUIS) 5 MG TABS tablet Take 1 tablet (5 mg total) by mouth 2 (two) times daily. 03/22/22 05/21/22  [name], DO     Kara Mead MD. [facility] Flood. [facility] Pager : [phone]  If no response to pager , please call [phone] until 7 pm After 7:00 pm call Elink  [phone]   03/21/2022

## 2022-03-21 NOTE — Plan of Care (Signed)

## 2022-03-21 NOTE — Progress Notes (Signed)
PROGRESS NOTE    Patient: Tony Sullivan                            PCP: [name], FNP                    DOB: 1961/09/07            DOA: 03/19/2022 [id number]             DOS: 03/21/2022, 11:14 AM   LOS: 1 day   Date of Service: The patient was seen and examined on 03/21/2022  Subjective:   The patient was seen and examined this morning, reporting with minimal exertion has shortness of breath, on RA satting 83%, on 4.5 L, satting 100%   Brief Narrative:    Tony Sullivan is a 61 y.o. male with medical history significant of chronic respiratory failure with hypoxia secondary to COPD on supplemental oxygen via [place] at 3 LPM, hyperlipidemia, hypertension, GERD, tobacco abuse who presents to the emergency department due to blood in sputum which started about 2 hours PTA.   Patient presented to ED 2 nights ago with complaints of several days of wheezing and shortness of breath, he also complained of chest tightness.  CT scan was done which showed a mass that was reported that he (the patient) was aware of and was following with a pulmonologist.  He was treated for a COPD exacerbation, symptoms improved and patient was discharged home, though, patient did not want to be discharged due to him still having some shortness of breath.  Patient states that after he got home, he noted blood when he coughs and since is still had some shortness of breath, he decided to return to the ED for further evaluation and management. Patient is a chronic smoker who states that he has cut down a lot on smoking and was trying to quit.  He denies fever, chills, headache, nausea, vomiting, abdominal pain.   Patient was recently admitted from 5/20 to 05/19 due to acute on chronic hypoxemic respiratory failure due to pneumonia with possible aspiration and then later also noted to have PE, he was treated with Levaquin and started on Eliquis.  Oxygen requirement at admission (6 L) was weaned down to baseline oxygen  requirement at 3 LPM   ED Course:  Pt was intermittently tachypneic, other vital signs are within normal range.  O2 sat was 98-100% on supplemental oxygen 4 LPM.  Work-up in the ED showed normal CBC except for leukocytosis and MCV of 100.7.  BMP was normal except for BUN of 21, magnesium 2.5, troponin x2 was flat at 15.   Influenza A, B, SARS coronavirus 2 was negative. Chest x-ray showed emphysema and bronchitis changes without acute airspace disease CT chest with contrast showed severe emphysema. Superimposed airway inflammation. Improved airway- impaction within the lower lobes bilaterally.   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 Correlation with endoscopy is recommended. Solumedrol, magnesium and potassium were given, breathing treatment with albuterol was provided and doxycycline was given as well.      Assessment & Plan:   Principal Problem:   COPD with acute exacerbation ([place]) Active Problems:   Tobacco abuse   Essential hypertension   GERD (gastroesophageal reflux disease)   Cigarette smoker   Acute on chronic respiratory failure with hypoxia ([facility])   Mixed hyperlipidemia  Elevated MCV   Thoracic compression fracture ([facility])   Hemoptysis     Assessment and Plan:   Acute on chronic respiratory failure with hypoxia secondary to COPD exacerbation -On room air patient is satting 83% Back on 4.5 L, satting 100% Apparently baseline O2 demand 2-4 L Continue duo nebs, Mucinex, Dulera, doxycycline. -Switching from IV Solu-Medrol to p.o. 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   Correlation with endoscopy is recommended Patient will be temporarily placed n.p.o. in anticipation for possible bronchoscopy in the morning Pending pulmonary evaluation  Elevated MCV MCV 100.7 Folate levels and vitamin B12 levels will be checked   Prior Thoracic compression fracture Stable, this occurred in 2019   Essential hypertension Continue losartan   GERD Continue Protonix   Mixed hyperlipidemia Continue Lipitor   Pulmonary embolism Temporarily hold off on Eliquis in anticipation for possible bronchoscopy by pulmonologist -Stable   Tobacco abuse Counseled on tobacco abuse cessation, patient states that he is working on cessation Declined NicoDerm  Severe acute on chronic debility -Scooter/wheelchair bound -PT OT, consulted for evaluation recommendation, encouraging patient to ambulate     Skin Assessment: I have examined the patient's skin and I agree with the wound assessment as performed by wound care team As outlined belowe: Pressure Injury 03/14/22 Coccyx Stage 2 -  Partial thickness loss of dermis presenting as a shallow open injury with a red, pink wound bed without slough. (Active)  03/14/22 1211  Location: Coccyx  Location Orientation:   Staging: Stage 2 -  Partial thickness loss of dermis presenting as a shallow open injury with a red, pink wound bed without slough.  Wound Description (Comments):   Present on Admission:   Dressing Type Foam - Lift dressing to assess site every shift 03/21/22 0900   ----------------------------------------------------------------------------------------------------------------  Consults: Pulmonology  DVT prophylaxis:  SCDs Start: 03/20/22 0113   Code Status:   Code Status: Full Code  Family Communication:  No family member present at bedside-  The above findings and plan of care has been discussed with patient  in detail,  they expressed understanding and agreement of above. -Advance care planning has been discussed.   Admission status:   Status is: Inpatient Remains inpatient appropriate because: Needing breathing treatment, supplemental oxygen, needs O2 supplement   Disposition: From home Per PT OT recommendation SNF-patient is agreeable    Procedures:   No admission procedures for hospital encounter.   Antimicrobials:  Anti-infectives (From admission, onward)    Start     Dose/Rate Route Frequency Ordered Stop   03/20/22 1000  doxycycline (VIBRA-TABS) tablet 100 mg        100 mg Oral Every 12 hours 03/20/22 0143     03/19/22 2115  doxycycline (VIBRA-TABS) tablet 100 mg        100 mg Oral  Once 03/19/22 2113 03/19/22 2133        Medication:   atorvastatin  40 mg Oral Daily   doxycycline  100 mg Oral Q12H   furosemide  40 mg Intravenous Once   guaiFENesin-dextromethorphan  10 mL Oral Q8H  ipratropium-albuterol  3 mL Nebulization Q6H   losartan  100 mg Oral Daily   mouth rinse  15 mL Mouth Rinse BID   mometasone-formoterol  2 puff Inhalation BID   pantoprazole  40 mg Oral Daily   polyethylene glycol  17 g Oral Daily   [START ON 03/22/2022] predniSONE  50 mg Oral Q breakfast    acetaminophen **OR** acetaminophen, ALPRAZolam, ipratropium-albuterol, simethicone   Objective:   Vitals:   03/20/22 2020 03/20/22 2049 03/21/22 0545 03/21/22 0813  BP:  139/89 (!) 134/91   Pulse:  80 83   Resp:  17 18   Temp:  98.2 F (36.8 C) (!) 97.5 F (36.4 C)   TempSrc:      SpO2: 99% 100% 100% (!) 83%  Weight:      Height:        Intake/Output Summary (Last 24 hours) at 03/21/2022 1114 Last data filed at 03/21/2022 5056 Gross per 24 hour  Intake 1866.88 ml  Output 1350 ml  Net 516.88 ml   Filed Weights   03/20/22 0128  Weight: 65.8 kg     Examination:      Physical Exam:   General:  AAO x 3,  cooperative, no distress;   HEENT:  Normocephalic, PERRL, otherwise with in Normal limits   Neuro:  CNII-XII intact. , normal motor and sensation, reflexes intact   Lungs:   Clear to auscultation BL, Respirations unlabored,  Diffuse but improved wheezes / No crackles  Cardio:    S1/S2, RRR, No murmure, No Rubs or Gallops   Abdomen:  Soft, non-tender, bowel sounds active all four quadrants, no guarding or peritoneal signs.  Muscular  skeletal:  Limited exam -global generalized weaknesses - in bed, able to move all 4 extremities,   2+ pulses,  symmetric, No pitting edema  Skin:  Dry, warm to touch, negative for any Rashes,  Wounds: Please see nursing documentation  Pressure Injury 03/14/22 Coccyx Stage 2 -  Partial thickness loss of dermis presenting as a shallow open injury with a red, pink wound bed without slough. (Active)  03/14/22 1211  Location: Coccyx  Location Orientation:   Staging: Stage 2 -  Partial thickness loss of dermis presenting as a shallow open injury with a red, pink wound bed without slough.  Wound Description (Comments):   Present on Admission:   Dressing Type Foam - Lift dressing to assess site every shift 03/21/22 0900        --------------------------------------------------------------------------------------------------------    LABs:     Latest Ref Rng & Units 03/20/2022    4:30 AM 03/19/2022    8:06 PM 03/18/2022    5:00 PM  CBC  WBC 4.0 - 10.5 K/uL 11.3   13.2   15.3    Hemoglobin 13.0 - 17.0 g/dL 11.4   13.8   14.9    Hematocrit 39.0 - 52.0 % 35.9   43.9   47.4    Platelets 150 - 400 K/uL 189   198   211        Latest Ref Rng & Units 03/20/2022    4:30 AM 03/19/2022    8:06 PM 03/18/2022    5:00 PM  CMP  Glucose 70 - 99 mg/dL 235   146   105    BUN 6 - 20 mg/dL 23   27   21     Creatinine 0.61 - 1.24 mg/dL 0.92   1.17   1.04    Sodium 135 - 145 mmol/L 141  140   142    Potassium 3.5 - 5.1 mmol/L  3.5   3.5   4.0    Chloride 98 - 111 mmol/L 104   100   99    CO2 22 - 32 mmol/L 29   31   33    Calcium 8.9 - 10.3 mg/dL 8.4   8.9   9.2    Total Protein 6.5 - 8.1 g/dL 4.9      Total Bilirubin 0.3 - 1.2 mg/dL 0.1      Alkaline Phos 38 - 126 U/L 89      AST 15 - 41 U/L 29      ALT 0 - 44 U/L 64           Micro Results Recent Results (from the past 240 hour(s))  Respiratory (~20 pathogens) panel by PCR     Status: None   Collection Time: 03/13/22  8:04 AM   Specimen: Nasopharyngeal Swab; Respiratory  Result Value Ref Range Status   Adenovirus NOT DETECTED NOT DETECTED Final   Coronavirus 229E NOT DETECTED NOT DETECTED Final    Comment: (NOTE) The Coronavirus on the Respiratory Panel, DOES NOT test for the novel  Coronavirus (2019 nCoV)    Coronavirus HKU1 NOT DETECTED NOT DETECTED Final   Coronavirus NL63 NOT DETECTED NOT DETECTED Final   Coronavirus OC43 NOT DETECTED NOT DETECTED Final   Metapneumovirus NOT DETECTED NOT DETECTED Final   Rhinovirus / Enterovirus NOT DETECTED NOT DETECTED Final   Influenza A NOT DETECTED NOT DETECTED Final   Influenza B NOT DETECTED NOT DETECTED Final   Parainfluenza Virus 1 NOT DETECTED NOT DETECTED Final   Parainfluenza Virus 2 NOT DETECTED NOT DETECTED Final   Parainfluenza Virus 3 NOT DETECTED NOT DETECTED Final   Parainfluenza Virus 4 NOT DETECTED NOT DETECTED Final   Respiratory Syncytial Virus NOT DETECTED NOT DETECTED Final   Bordetella pertussis NOT DETECTED NOT DETECTED Final   Bordetella Parapertussis NOT DETECTED NOT DETECTED Final   Chlamydophila pneumoniae NOT DETECTED NOT DETECTED Final   Mycoplasma pneumoniae NOT DETECTED NOT DETECTED Final    Comment: Performed at [facility], [place] [address] [place], [place] [address]  Resp Panel by RT-PCR (Flu A&B, Covid) Nasopharyngeal Swab     Status: None   Collection Time: 03/19/22 11:30 PM   Specimen: Nasopharyngeal Swab; Nasopharyngeal(NP) swabs in vial transport medium   Result Value Ref Range Status   SARS Coronavirus 2 by RT PCR NEGATIVE NEGATIVE Final    Comment: (NOTE) SARS-CoV-2 target nucleic acids are NOT DETECTED.  The SARS-CoV-2 RNA is generally detectable in upper respiratory specimens during the acute phase of infection. The lowest concentration of SARS-CoV-2 viral copies this assay can detect is 138 copies/mL. A negative result does not preclude SARS-Cov-2 infection and should not be used as the sole basis for treatment or other patient management decisions. A negative result may occur with  improper specimen collection/handling, submission of specimen other than nasopharyngeal swab, presence of viral mutation(s) within the areas targeted by this assay, and inadequate number of viral copies(<138 copies/mL). A negative result must be combined with clinical observations, patient history, and epidemiological information. The expected result is Negative.  Fact Sheet for Patients:  EntrepreneurPulse.com.au  Fact Sheet for Healthcare Providers:  IncredibleEmployment.be  This test is no t yet approved or cleared by the [place] FDA and  has been authorized for detection and/or diagnosis of SARS-CoV-2 by FDA under an Emergency Use Authorization (EUA).  This EUA will remain  in effect (meaning this test can be used) for the duration of the COVID-19 declaration under Section 564(b)(1) of the Act, 21 U.S.C.section 360bbb-3(b)(1), unless the authorization is terminated  or revoked sooner.       Influenza A by PCR NEGATIVE NEGATIVE Final   Influenza B by PCR NEGATIVE NEGATIVE Final    Comment: (NOTE) The Xpert Xpress SARS-CoV-2/FLU/RSV plus assay is intended as an aid in the diagnosis of influenza from Nasopharyngeal swab specimens and should not be used as a sole basis for treatment. Nasal washings and aspirates are unacceptable for Xpert Xpress SARS-CoV-2/FLU/RSV testing.  Fact Sheet for  Patients: EntrepreneurPulse.com.au  Fact Sheet for Healthcare Providers: IncredibleEmployment.be  This test is not yet approved or cleared by the [place] FDA and has been authorized for detection and/or diagnosis of SARS-CoV-2 by FDA under an Emergency Use Authorization (EUA). This EUA will remain in effect (meaning this test can be used) for the duration of the COVID-19 declaration under Section 564(b)(1) of the Act, 21 U.S.C. section 360bbb-3(b)(1), unless the authorization is terminated or revoked.  Performed at [facility], [address] [place], [place] [address]     Radiology Reports No results found.  SIGNED: Deatra James, MD, FHM. [facility],  Pager (please use [email or web address] to page/text) Please use Epic Secure Chat for non-urgent communication (7AM-7PM)  If 7PM-7AM, please contact night-coverage [email or web address], 03/21/2022, 11:14 AM

## 2022-03-21 NOTE — Consult Note (Signed)
NAME:  Tony Sullivan, MRN:  [id number], DOB:  31-May-1961, LOS: 1 ADMISSION DATE:  03/19/2022, CONSULTATION DATE:  03/21/2022  REFERRING MD:  shahmehdi, CHIEF COMPLAINT: Hemoptysis, abnormal CT  History of Present Illness:  61 year old smoker with COPD and chronic respiratory failure on 3 L oxygen at home.  He had repeated hospital admissions for COPD exacerbations and has had multiple CT scans over the past few months. He presented to the ED with hemoptysis/20 and had a CT scan which showed opacification in the left upper lobe.  He was subsequently admitted twice and treated for COPD exacerbation on 4/25 and 5/3. On 5/17 he was again admitted and this time treated for an aspiration pneumonia, CT again showed opacification of the left upper lobe and segmental PE for which she was started on Eliquis and also treated with Levaquin. He was again admitted 5/23 for blood-tinged sputum x1 and shortness of breath.  CT chest again demonstrated opacification in suggested endobronchial mass in the left upper lobe. Hence PCCM consulted.  He is worried about the possibility of lung malignancy. Baseline uses wheelchair due to chronic shortness of breath   Pertinent  Medical History  Tobacco abuse Hypertension GERD  Significant Hospital Events: Including procedures, antibiotic start and stop dates in addition to other pertinent events   CTA chest 09/2021 bronchial wall thickening, patent airways CTA chest 02/14/2022 segmental airway opacification in the left upper lobe CTA chest 03/13/2022 right upper lobe segmental PE, airway obstruction/impaction left upper lobe segmental main bronchus with mucous plug/debris 03/19/2022 CT chest with contrast -endobronchial mass in LUL, extending into peripheral segmental bronchi  Interim History / Subjective:  Reports 2 episodes of blood-tinged sputum in the past 2 months Breathing has been poor  Objective   Blood pressure (!) 120/93, pulse 97, temperature (!) 97.4  F (36.3 C), temperature source Oral, resp. rate 20, height 5\' 11"  (1.803 m), weight 65.8 kg, SpO2 100 %.        Intake/Output Summary (Last 24 hours) at 03/21/2022 1411 Last data filed at 03/21/2022 3810 Gross per 24 hour  Intake 1630.88 ml  Output 1150 ml  Net 480.88 ml   Filed Weights   03/20/22 0128  Weight: 65.8 kg    Examination: General: Chronically ill-appearing man, sitting up in bed, no distress HENT: No JVD, mild pallor, no icterus Lungs: Barrel chest, decreased breath sounds bilateral faint expiratory rhonchi left lung Cardiovascular: S1-S2 regular, no murmur Abdomen: Soft, nontender, no organomegaly Extremities: No deformity, 1+ bipedal edema Neuro: Alert, interactive, nonfocal, no asterixis GU: Clear urine  Resolved Hospital Problem list     Assessment & Plan:   Chronic respiratory failure with hypoxia and hypercarbia Underlying severe COPD Hemoptysis New finding of pulmonary embolism 5/17, on Eliquis  -Finding of left upper lobe airway opacification is new since episode of hemoptysis in April.  Prior to that CT scan from December shows clear airways.  Given acute appearance it is more likely that this is related to hemoptysis and blood in airway or mucous plug, however only way to definitely rule out malignancy would be by bronchoscopy.  I discussed risk of procedure including that of respiratory failure and prolonged mechanical ventilation since he has severe advanced COPD and chronic respiratory failure. I discussed alternative of serial follow-up imaging but it seems that this airway opacification has been present since April. He would like to discuss with his family further  -I have tentatively arranged for bronchoscopy under general anesthesia on 5/26 at 1:30 PM  Best Practice (right click and "Reselect all SmartList Selections" daily)   Per primary team Code Status:  full code Last date of multidisciplinary goals of care discussion [NA]  Labs    CBC: Recent Labs  Lab 03/15/22 0342 03/18/22 1700 03/19/22 2006 03/20/22 0430  WBC 12.0* 15.3* 13.2* 11.3*  NEUTROABS  --   --  11.4*  --   HGB 12.2* 14.9 13.8 11.4*  HCT 39.2 47.4 43.9 35.9*  MCV 100.0 100.4* 100.7* 101.1*  PLT 185 211 198 063    Basic Metabolic Panel: Recent Labs  Lab 03/15/22 0342 03/18/22 1700 03/19/22 2006 03/20/22 0430  NA 140 142 140 141  K 3.3* 4.0 3.5 3.5  CL 101 99 100 104  CO2 32 33* 31 29  GLUCOSE 113* 105* 146* 235*  BUN 11 21* 27* 23*  CREATININE 0.80 1.04 1.17 0.92  CALCIUM 8.4* 9.2 8.9 8.4*  MG  --   --  2.5* 2.6*  PHOS  --   --   --  2.2*   GFR: Estimated Creatinine Clearance: 81.5 mL/min (by C-G formula based on SCr of 0.92 mg/dL). Recent Labs  Lab 03/15/22 0342 03/18/22 1700 03/19/22 2006 03/20/22 0430  WBC 12.0* 15.3* 13.2* 11.3*    Liver Function Tests: Recent Labs  Lab 03/20/22 0430  AST 29  ALT 64*  ALKPHOS 89  BILITOT 0.1*  PROT 4.9*  ALBUMIN 2.5*   No results for input(s): LIPASE, AMYLASE in the last 168 hours. No results for input(s): AMMONIA in the last 168 hours.  ABG    Component Value Date/Time   PHART 7.46 (H) 03/21/2022 1208   PCO2ART 50 (H) 03/21/2022 1208   PO2ART 85 03/21/2022 1208   HCO3 35.4 (H) 03/21/2022 1208   TCO2 30 05/05/2021 0244   ACIDBASEDEF 2.0 09/30/2018 0939   O2SAT 98.9 03/21/2022 1208     Coagulation Profile: Recent Labs  Lab 03/19/22 2041  INR 1.2    Cardiac Enzymes: No results for input(s): CKTOTAL, CKMB, CKMBINDEX, TROPONINI in the last 168 hours.  HbA1C: Hgb A1c MFr Bld  Date/Time Value Ref Range Status  05/05/2021 02:29 AM 6.1 (H) 4.8 - 5.6 % Final    Comment:    (NOTE) Pre diabetes:          5.7%-6.4%  Diabetes:              >6.4%  Glycemic control for   <7.0% adults with diabetes     CBG: No results for input(s): GLUCAP in the last 168 hours.  Review of Systems:   Blood-tinged sputum x2 Shortness of breath Dry cough Chest tightness  Past  Medical History:  He,  has a past medical history of Anxiety, Arthritis, COPD (chronic obstructive pulmonary disease) ([place]) (2004), Depression, GERD (gastroesophageal reflux disease), High blood pressure, Prediabetes, Seasonal allergies, Substance abuse ([place]), and Tobacco dependence.   Surgical History:   Past Surgical History:  Procedure Laterality Date   ANTERIOR CERVICAL DECOMP/DISCECTOMY FUSION N/A 09/06/2019   Procedure: Anterior Cervical Discecectomy Fusion - Cerivcal Five-Cervical Six;  Surgeon: Kary Kos, MD;  Location: [facility];  Service: Neurosurgery;  Laterality: N/A;  Anterior Cervical Discecectomy Fusion - Cerivcal Five-Cervical Six   ANTERIOR CERVICAL DISCECTOMY  09/06/2019   APPENDECTOMY  1980   COLONOSCOPY WITH PROPOFOL N/A 12/10/2016   Procedure: COLONOSCOPY WITH PROPOFOL;  Surgeon: [name], MD;  Location: [facility];  Service: Endoscopy;  Laterality: N/A;   HIP SURGERY Right    SHOULDER ARTHROSCOPY Right  Social History:   reports that he has been smoking cigarettes. He has a 9.00 pack-year smoking history. He has never used smokeless tobacco. He reports that he does not currently use alcohol after a past usage of about 14.0 - 21.0 standard drinks per week. He reports current drug use. Drugs: Marijuana and Cocaine.   Family History:  His family history includes Allergies in his father, mother, and son; Asthma in his mother; Cancer in his mother; Clotting disorder in his mother; Diabetes in his brother; Emphysema in his father and mother; Heart disease in his mother; Seizures in his brother.   Allergies Allergies  Allergen Reactions   Penicillins Anaphylaxis    Has patient had a PCN reaction causing immediate rash, facial/tongue/throat swelling, SOB or lightheadedness with hypotension: Yes Has patient had a PCN reaction causing severe rash involving mucus membranes or skin necrosis: No Has patient had a PCN reaction that required hospitalization No Has patient  had a PCN reaction occurring within the last 10 years: No If all of the above answers are "NO", then may proceed with Cephalosporin use.    Levocetirizine Other (See Comments)    Muscle cramps     Home Medications  Prior to Admission medications   Medication Sig Start Date End Date Taking? Authorizing Provider  acetaminophen (TYLENOL) 325 MG tablet Take 2 tablets (650 mg total) by mouth every 6 (six) hours as needed for mild pain (or Fever >/= 101). 10/30/21  Yes Emokpae, Courage, MD  albuterol (PROVENTIL) (2.5 MG/3ML) 0.083% nebulizer solution Take 3 mLs (2.5 mg total) by nebulization every 4 (four) hours as needed for wheezing or shortness of breath. 12/12/21 12/12/22 Yes [name], MD  albuterol (VENTOLIN HFA) 108 (90 Base) MCG/ACT inhaler Inhale 2 puffs into the lungs every 6 (six) hours as needed for wheezing or shortness of breath. 03/15/22  Yes [name], Pratik D, DO  apixaban (ELIQUIS) 5 MG TABS tablet Take 2 tablets (10 mg total) by mouth 2 (two) times daily for 7 days. 03/15/22 03/22/22 Yes Shah, Pratik D, DO  atorvastatin (LIPITOR) 40 MG tablet TAKE ONE TABLET BY MOUTH DAILY Patient taking differently: Take 40 mg by mouth daily. 07/06/20  Yes [name], MD  benztropine (COGENTIN) 0.5 MG tablet Take 0.5 mg by mouth at bedtime.   Yes [provider]  Budeson-Glycopyrrol-Formoterol (BREZTRI AEROSPHERE) 160-9-4.8 MCG/ACT AERO Inhale 2 puffs into the lungs in the morning and at bedtime. 12/12/21  Yes [name], MD  cetirizine (ZYRTEC) 10 MG tablet Take 10 mg by mouth daily.   Yes [provider]  diclofenac Sodium (VOLTAREN) 1 % GEL Apply 2 g topically 4 (four) times daily as needed (pain).   Yes [provider]  dorzolamide (TRUSOPT) 2 % ophthalmic solution Place 1 drop into the left eye 2 (two) times daily. 12/12/20  Yes [provider]  gabapentin (NEURONTIN) 100 MG capsule Take 1 capsule (100 mg total) by mouth 2 (two) times daily. 03/15/22 04/14/22  Yes Shah, Pratik D, DO  guaiFENesin 200 MG tablet Take 2 tablets (400 mg total) by mouth every 6 (six) hours as needed for cough or to loosen phlegm. 03/02/22  Yes Tat, Shanon Brow, MD  hydrochlorothiazide (HYDRODIURIL) 25 MG tablet Take 0.5 tablets (12.5 mg total) by mouth daily. 10/30/21  Yes [name], MD  hydrOXYzine (ATARAX) 25 MG tablet Take 1 tablet (25 mg total) by mouth 3 (three) times daily as needed for anxiety or nausea. 10/30/21  Yes [name], MD  latanoprost (XALATAN) 0.005 % ophthalmic solution Place 1 drop into both eyes at bedtime. 05/24/19  Yes [provider]  losartan (COZAAR) 100 MG tablet Take 100 mg by mouth daily.   Yes [provider]  loxapine (LOXITANE) 10 MG capsule Take 10 mg by mouth at bedtime.    Yes [provider]  mirtazapine (REMERON) 30 MG tablet Take 1 tablet (30 mg total) by mouth at bedtime. 10/30/21  Yes Emokpae, Courage, MD  Multiple Vitamin (MULTIVITAMIN WITH MINERALS) TABS tablet Take 1 tablet by mouth daily. 10/31/21  Yes Emokpae, Courage, MD  OXYGEN Inhale 3-4 L into the lungs as directed. 3-4 liters with sleep and exertion as needed for low oxygen   Yes [provider]  pantoprazole (PROTONIX) 40 MG tablet Take 1 tablet (40 mg total) by mouth daily. 10/30/21  Yes Emokpae, Courage, MD  polyethylene glycol (MIRALAX / GLYCOLAX) 17 g packet TAKE 17 GRAMS BY MOUTH DAILY Patient taking differently: Take 17 g by mouth daily as needed for mild constipation. 03/09/20  Yes [name], MD  predniSONE (DELTASONE) 20 MG tablet Take 1 tablet (20 mg total) by mouth daily. 03/15/22 04/14/22 Yes Shah, Pratik D, DO  risperiDONE (RISPERDAL) 2 MG tablet Take 1 tablet (2 mg total) by mouth at bedtime. 10/30/21  Yes Emokpae, Courage, MD  senna-docusate (SENOKOT-S) 8.6-50 MG tablet Take 2 tablets by mouth at bedtime. Patient taking differently: Take 2 tablets by mouth at bedtime as needed for mild constipation. 10/30/21  Yes [name], MD   simethicone (MYLICON) 80 MG chewable tablet Chew 1 tablet (80 mg total) by mouth 4 (four) times daily as needed for flatulence. 03/15/22  Yes [name], Pratik D, DO  apixaban (ELIQUIS) 5 MG TABS tablet Take 1 tablet (5 mg total) by mouth 2 (two) times daily. 03/22/22 05/21/22  [name], DO     Kara Mead MD. Shade Flood. [place] Pager : [phone]  If no response to pager , please call [phone] until 7 pm After 7:00 pm call Elink  [phone]   03/21/2022

## 2022-03-21 NOTE — Progress Notes (Signed)
Pt advised to ambulate several times this shift, refuses to ambulate states he is too weak.

## 2022-03-22 ENCOUNTER — Inpatient Hospital Stay (HOSPITAL_COMMUNITY): Payer: Medicaid Other | Admitting: Anesthesiology

## 2022-03-22 ENCOUNTER — Encounter (HOSPITAL_COMMUNITY): Payer: Self-pay | Admitting: Internal Medicine

## 2022-03-22 ENCOUNTER — Inpatient Hospital Stay (HOSPITAL_COMMUNITY): Payer: Medicaid Other

## 2022-03-22 ENCOUNTER — Encounter (HOSPITAL_COMMUNITY)
Admission: EM | Disposition: A | Discharge status: Home Health | Payer: Self-pay | Source: Home / Self Care | Attending: Family Medicine

## 2022-03-22 DIAGNOSIS — J441 Chronic obstructive pulmonary disease with (acute) exacerbation: Secondary | ICD-10-CM | POA: Diagnosis not present

## 2022-03-22 DIAGNOSIS — R042 Hemoptysis: Secondary | ICD-10-CM

## 2022-03-22 DIAGNOSIS — F17210 Nicotine dependence, cigarettes, uncomplicated: Secondary | ICD-10-CM

## 2022-03-22 DIAGNOSIS — R918 Other nonspecific abnormal finding of lung field: Secondary | ICD-10-CM | POA: Diagnosis not present

## 2022-03-22 DIAGNOSIS — I10 Essential (primary) hypertension: Secondary | ICD-10-CM | POA: Diagnosis not present

## 2022-03-22 DIAGNOSIS — J9621 Acute and chronic respiratory failure with hypoxia: Secondary | ICD-10-CM | POA: Diagnosis not present

## 2022-03-22 DIAGNOSIS — J449 Chronic obstructive pulmonary disease, unspecified: Secondary | ICD-10-CM

## 2022-03-22 HISTORY — PX: FLEXIBLE BRONCHOSCOPY: SHX5094

## 2022-03-22 SURGERY — BRONCHOSCOPY, FLEXIBLE
Anesthesia: General | Site: Chest | Laterality: Left

## 2022-03-22 MED ORDER — EPINEPHRINE PF 1 MG/ML IJ SOLN
INTRAMUSCULAR | Status: AC
  Filled 2022-03-22: qty 2

## 2022-03-22 MED ORDER — PHENYLEPHRINE 80 MCG/ML (10ML) SYRINGE FOR IV PUSH (FOR BLOOD PRESSURE SUPPORT)
PREFILLED_SYRINGE | INTRAVENOUS | Status: AC
  Filled 2022-03-22: qty 10

## 2022-03-22 MED ORDER — ONDANSETRON HCL 4 MG/2ML IJ SOLN
INTRAMUSCULAR | Status: DC | PRN
Start: 2022-03-22 — End: 2022-03-22
  Administered 2022-03-22: 4 mg via INTRAVENOUS

## 2022-03-22 MED ORDER — LIDOCAINE HCL (PF) 1 % IJ SOLN
INTRAMUSCULAR | Status: AC
  Filled 2022-03-22: qty 30

## 2022-03-22 MED ORDER — SUCCINYLCHOLINE CHLORIDE 200 MG/10ML IV SOSY
PREFILLED_SYRINGE | INTRAVENOUS | Status: DC | PRN
  Administered 2022-03-22: 100 mg via INTRAVENOUS

## 2022-03-22 MED ORDER — PROPOFOL 10 MG/ML IV BOLUS
INTRAVENOUS | Status: DC | PRN
  Administered 2022-03-22: 60 mg via INTRAVENOUS
  Administered 2022-03-22: 120 mg via INTRAVENOUS

## 2022-03-22 MED ORDER — IPRATROPIUM-ALBUTEROL 0.5-2.5 (3) MG/3ML IN SOLN
3.0000 mL | Freq: Once | RESPIRATORY_TRACT | Status: AC | PRN
  Administered 2022-03-22: 3 mL via RESPIRATORY_TRACT

## 2022-03-22 MED ORDER — LACTATED RINGERS IV SOLN: INTRAVENOUS | Status: DC

## 2022-03-22 MED ORDER — FUROSEMIDE 10 MG/ML IJ SOLN
40.0000 mg | Freq: Once | INTRAMUSCULAR | Status: AC
  Administered 2022-03-22: 40 mg via INTRAVENOUS
  Filled 2022-03-22: qty 4

## 2022-03-22 MED ORDER — PHENYLEPHRINE 80 MCG/ML (10ML) SYRINGE FOR IV PUSH (FOR BLOOD PRESSURE SUPPORT)
PREFILLED_SYRINGE | INTRAVENOUS | Status: DC | PRN
  Administered 2022-03-22: 80 ug via INTRAVENOUS
  Administered 2022-03-22: 160 ug via INTRAVENOUS

## 2022-03-22 MED ORDER — ONDANSETRON HCL 4 MG/2ML IJ SOLN
INTRAMUSCULAR | Status: AC
  Filled 2022-03-22: qty 2

## 2022-03-22 MED ORDER — LIDOCAINE HCL (CARDIAC) PF 100 MG/5ML IV SOSY
PREFILLED_SYRINGE | INTRAVENOUS | Status: DC | PRN
  Administered 2022-03-22: 100 mg via INTRAVENOUS

## 2022-03-22 MED ORDER — 0.9 % SODIUM CHLORIDE (POUR BTL) OPTIME
TOPICAL | Status: DC | PRN
  Administered 2022-03-22: 1000 mL

## 2022-03-22 MED ORDER — LACTATED RINGERS IV SOLN: INTRAVENOUS | Status: DC | PRN

## 2022-03-22 MED ORDER — METHYLPREDNISOLONE SODIUM SUCC 40 MG IJ SOLR
40.0000 mg | Freq: Once | INTRAMUSCULAR | Status: AC
  Administered 2022-03-22: 40 mg via INTRAVENOUS
  Filled 2022-03-22: qty 1

## 2022-03-22 MED ORDER — SUCCINYLCHOLINE CHLORIDE 200 MG/10ML IV SOSY
PREFILLED_SYRINGE | INTRAVENOUS | Status: AC
  Filled 2022-03-22: qty 10

## 2022-03-22 MED ORDER — IPRATROPIUM-ALBUTEROL 0.5-2.5 (3) MG/3ML IN SOLN
RESPIRATORY_TRACT | Status: AC
  Filled 2022-03-22: qty 3

## 2022-03-22 NOTE — Anesthesia Procedure Notes (Signed)
Procedure Name: Intubation Date/Time: 03/22/2022 1:23 PM Performed by: [place] Jamicheal Heard C, CRNA Pre-anesthesia Checklist: Patient identified, Emergency Drugs available, Suction available and Patient being monitored Patient Re-evaluated:Patient Re-evaluated prior to induction Oxygen Delivery Method: Circle system utilized Preoxygenation: Pre-oxygenation with 100% oxygen Induction Type: IV induction Ventilation: Mask ventilation without difficulty Laryngoscope Size: Mac and 4 Grade View: Grade II Tube type: Oral Tube size: 8.0 mm Number of attempts: 1 Airway Equipment and Method: Stylet and Oral airway Placement Confirmation: ETT inserted through vocal cords under direct vision, positive ETCO2 and breath sounds checked- equal and bilateral Secured at: 23 cm Tube secured with: Tape Dental Injury: Teeth and Oropharynx as per pre-operative assessment

## 2022-03-22 NOTE — Procedures (Signed)
Bronchoscopy Procedure Note  Tony Sullivan  [id number]  [birth year]  Date:03/22/22  Time:2:08 PM   Provider Performing:Samuell Knoble V. Burma Ketcher   Procedure(s):  Flexible bronchoscopy with brushing (03212), Flexible bronchoscopy with bronchial alveolar lavage (24825), and Endobronchial biopsy (00370)  Indication(s) LUL endobronchial mass with hemoptysis  Consent Risks of the procedure as well as the alternatives and risks of each were explained to the patient and/or caregiver.  Consent for the procedure was obtained and is signed in the bedside chart  Anesthesia general   Time Out Verified patient identification, verified procedure, site/side was marked, verified correct patient position, special equipment/implants available, medications/allergies/relevant history reviewed, required imaging and test results available.   Sterile Technique Usual hand hygiene, masks, gowns, and gloves were used   Procedure Description Bronchoscope advanced through endotracheal tube and into airway.  Airways were examined down to subsegmental level with findings noted below.   Following diagnostic evaluation, BAL(s) performed in LUL  with normal saline and return of sanguinous fluid fluid, Brushing(s) performed in LUL x 2 , and Endobronchial biopsies performed in LUL mass x 5-6  Findings: Endobronchial mass seen blocking take off of LUL bronchus, lingular bronchus was open. This was brushed & biopsied   Complications/Tolerance None; patient tolerated the procedure well. Chest X-ray is not needed post procedure.   EBL Minimal   Specimen(s) BAL for micro & cytology Brushings for cytology Endobronchial biopsies for path  Orlen Leedy V. Elsworth Soho MD

## 2022-03-22 NOTE — Progress Notes (Signed)
NAME:  Tony Sullivan, MRN:  [id number], DOB:  June 05, 1961, LOS: 2 ADMISSION DATE:  03/19/2022, CONSULTATION DATE:  03/22/2022  REFERRING MD:  shahmehdi, CHIEF COMPLAINT: Hemoptysis, abnormal CT  History of Present Illness:  61 year old smoker with COPD and chronic respiratory failure on 3 L oxygen at home.  He had repeated hospital admissions for COPD exacerbations and has had multiple CT scans over the past few months. He presented to the ED with hemoptysis/20 and had a CT scan which showed opacification in the left upper lobe.  He was subsequently admitted twice and treated for COPD exacerbation on 4/25 and 5/3. On 5/17 he was again admitted and this time treated for an aspiration pneumonia, CT again showed opacification of the left upper lobe and segmental PE for which she was started on Eliquis and also treated with Levaquin. He was again admitted 5/23 for blood-tinged sputum x1 and shortness of breath.  CT chest again demonstrated opacification in suggested endobronchial mass in the left upper lobe. Hence PCCM consulted.  He is worried about the possibility of lung malignancy. Baseline uses wheelchair due to chronic shortness of breath   Pertinent  Medical History  Tobacco abuse Hypertension GERD  Significant Hospital Events: Including procedures, antibiotic start and stop dates in addition to other pertinent events   CTA chest 09/2021 bronchial wall thickening, patent airways CTA chest 02/14/2022 segmental airway opacification in the left upper lobe CTA chest 03/13/2022 right upper lobe segmental PE, airway obstruction/impaction left upper lobe segmental main bronchus with mucous plug/debris 03/19/2022 CT chest with contrast -endobronchial mass in LUL, extending into peripheral segmental bronchi  Interim History / Subjective:   Complaints of dyspnea. No further episodes of hemoptysis   Objective   Blood pressure 133/89, pulse 82, temperature 98.1 F (36.7 C), resp. rate 18, height  5\' 11"  (1.803 m), weight 65.8 kg, SpO2 99 %.        Intake/Output Summary (Last 24 hours) at 03/22/2022 1412 Last data filed at 03/22/2022 1240 Gross per 24 hour  Intake 480 ml  Output 800 ml  Net -320 ml    Filed Weights   03/20/22 0128  Weight: 65.8 kg    Examination: General: Chronically ill-appearing man, sitting up in bed, no distress HENT: No JVD, mild pallor, no icterus Lungs: Decreased breath sounds bilateral, barrel chest, no rhonchi, no accessory muscle use Cardiovascular: S1-S2 regular, no murmur Abdomen: Soft, nontender, no organomegaly Extremities: No deformity, 1+ bipedal edema Neuro: Alert, interactive, nonfocal, no asterixis GU: Clear urine  Resolved Hospital Problem list     Assessment & Plan:   Chronic respiratory failure with hypoxia and hypercarbia Underlying severe COPD Hemoptysis -left upper lobe endobronchial lesion Acute pulmonary embolism 5/17, on Eliquis  -Finding of left upper lobe airway opacification is new since episode of hemoptysis in April.  Prior to that CT scan from December shows clear airways. -He is willing to proceed with bronchoscopy.  We discussed risks of procedure including bleeding, chance of respiratory failure, prolonged mechanical ventilation given his severe COPD   Addendum-bronchoscopy showed left upper lobe mass blocking takeoff of left upper lobe bronchus.  This was brushed and biopsied.  Apixaban can be resumed 5/27 if he has no bleeding.  Based on results, oncology can be consulted but I do not think he is a candidate for surgical resection.  He may be a candidate for radiation therapy  Best Practice (right click and "Reselect all SmartList Selections" daily)   Per primary team Code Status:  full code Last date of multidisciplinary goals of care discussion [NA]  Labs   CBC: Recent Labs  Lab 03/18/22 1700 03/19/22 2006 03/20/22 0430  WBC 15.3* 13.2* 11.3*  NEUTROABS  --  11.4*  --   HGB 14.9 13.8 11.4*  HCT  47.4 43.9 35.9*  MCV 100.4* 100.7* 101.1*  PLT 211 198 189     Basic Metabolic Panel: Recent Labs  Lab 03/18/22 1700 03/19/22 2006 03/20/22 0430  NA 142 140 141  K 4.0 3.5 3.5  CL 99 100 104  CO2 33* 31 29  GLUCOSE 105* 146* 235*  BUN 21* 27* 23*  CREATININE 1.04 1.17 0.92  CALCIUM 9.2 8.9 8.4*  MG  --  2.5* 2.6*  PHOS  --   --  2.2*    GFR: Estimated Creatinine Clearance: 81.5 mL/min (by C-G formula based on SCr of 0.92 mg/dL). Recent Labs  Lab 03/18/22 1700 03/19/22 2006 03/20/22 0430  WBC 15.3* 13.2* 11.3*     Liver Function Tests: Recent Labs  Lab 03/20/22 0430  AST 29  ALT 64*  ALKPHOS 89  BILITOT 0.1*  PROT 4.9*  ALBUMIN 2.5*    No results for input(s): LIPASE, AMYLASE in the last 168 hours. No results for input(s): AMMONIA in the last 168 hours.  ABG    Component Value Date/Time   PHART 7.46 (H) 03/21/2022 1208   PCO2ART 50 (H) 03/21/2022 1208   PO2ART 85 03/21/2022 1208   HCO3 35.4 (H) 03/21/2022 1208   TCO2 30 05/05/2021 0244   ACIDBASEDEF 2.0 09/30/2018 0939   O2SAT 98.9 03/21/2022 1208      Coagulation Profile: Recent Labs  Lab 03/19/22 2041  INR 1.2     Cardiac Enzymes: No results for input(s): CKTOTAL, CKMB, CKMBINDEX, TROPONINI in the last 168 hours.  HbA1C: Hgb A1c MFr Bld  Date/Time Value Ref Range Status  05/05/2021 02:29 AM 6.1 (H) 4.8 - 5.6 % Final    Comment:    (NOTE) Pre diabetes:          5.7%-6.4%  Diabetes:              >6.4%  Glycemic control for   <7.0% adults with diabetes     CBG: No results for input(s): GLUCAP in the last 168 hours.     Kara Mead MD. Shade Flood. [place] Pager : [phone]  If no response to pager , please call [phone] until 7 pm After 7:00 pm call Elink  [phone]   03/22/2022

## 2022-03-22 NOTE — Transfer of Care (Signed)
Immediate Anesthesia Transfer of Care Note  Patient: Tony Sullivan  Procedure(s) Performed: FLEXIBLE BRONCHOSCOPY (Left: Chest)  Patient Location: PACU  Anesthesia Type:General  Level of Consciousness: awake and alert   Airway & Oxygen Therapy: Patient Spontanous Breathing and Patient connected to face mask oxygen  Post-op Assessment: Report given to RN and Post -op Vital signs reviewed and stable  Post vital signs: Reviewed and stable  Last Vitals:  Vitals Value Taken Time  BP    Temp    Pulse    Resp    SpO2      Last Pain:  Vitals:   03/22/22 1230  TempSrc:   PainSc: 0-No pain         Complications: No notable events documented.

## 2022-03-22 NOTE — Progress Notes (Signed)
Pt would like team to be informed that step daughter [name] is his main contact and pleases inform her of his health procedures and plans. Her phone number is [phone]. [name]

## 2022-03-22 NOTE — Interval H&P Note (Signed)
History and Physical Interval Note:  03/22/2022 1:01 PM  Tony Sullivan  has presented today for surgery, with the diagnosis of lung mass.  The various methods of treatment have been discussed with the patient and family. After consideration of risks, benefits and other options for treatment, the patient has consented to  Procedure(s): FLEXIBLE BRONCHOSCOPY (Right) as a surgical intervention.  The patient's history has been reviewed, patient examined, no change in status, stable for surgery.  I have reviewed the patient's chart and labs.  Questions were answered to the patient's satisfaction.     Leanna Sato Elsworth Soho

## 2022-03-22 NOTE — Progress Notes (Signed)
PROGRESS NOTE    Patient: Tony Sullivan                            PCP: [name], FNP                    DOB: [birth year]            DOA: 03/19/2022 WUJ:[id number]             DOS: 03/22/2022, 11:37 AM   LOS: 2 days   Date of Service: The patient was seen and examined on 03/22/2022  Subjective:   The patient was seen and examined this morning, stable no acute distress, seems little anxious, complaining shortness of breath but satting 100% on 4 L of oxygen,  Stating I have fluid in my lungs, complaining of congestion   Brief Narrative:    Tony Sullivan is a 61 y.o. male with medical history significant of chronic respiratory failure with hypoxia secondary to COPD on supplemental oxygen via [place] at 3 LPM, hyperlipidemia, hypertension, GERD, tobacco abuse who presents to the emergency department due to blood in sputum which started about 2 hours PTA.   Patient presented to ED 2 nights ago with complaints of several days of wheezing and shortness of breath, he also complained of chest tightness.  CT scan was done which showed a mass that was reported that he (the patient) was aware of and was following with a pulmonologist.  He was treated for a COPD exacerbation, symptoms improved and patient was discharged home, though, patient did not want to be discharged due to him still having some shortness of breath.  Patient states that after he got home, he noted blood when he coughs and since is still had some shortness of breath, he decided to return to the ED for further evaluation and management. Patient is a chronic smoker who states that he has cut down a lot on smoking and was trying to quit.  He denies fever, chills, headache, nausea, vomiting, abdominal pain.   Patient was recently admitted from 5/20 to 05/19 due to acute on chronic hypoxemic respiratory failure due to pneumonia with possible aspiration and then later also noted to have PE, he was treated with Levaquin and started on  Eliquis.  Oxygen requirement at admission (6 L) was weaned down to baseline oxygen requirement at 3 LPM   ED Course:  Pt was intermittently tachypneic, other vital signs are within normal range.  O2 sat was 98-100% on supplemental oxygen 4 LPM.  Work-up in the ED showed normal CBC except for leukocytosis and MCV of 100.7.  BMP was normal except for BUN of 21, magnesium 2.5, troponin x2 was flat at 15.   Influenza A, B, SARS coronavirus 2 was negative. Chest x-ray showed emphysema and bronchitis changes without acute airspace disease CT chest with contrast showed severe emphysema. Superimposed airway inflammation. Improved airway- impaction within the lower lobes bilaterally.   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 Correlation with endoscopy is recommended. Solumedrol, magnesium and potassium were given, breathing treatment with albuterol was provided and doxycycline was given as well.      Assessment & Plan:   Principal Problem:   COPD with acute exacerbation ([place]) Active Problems:   Tobacco abuse   Essential hypertension   GERD (gastroesophageal reflux disease)   Cigarette smoker   Acute  on chronic respiratory failure with hypoxia (HCC)   Mixed hyperlipidemia   Elevated MCV   Thoracic compression fracture (HCC)   Hemoptysis     Assessment and Plan:   Acute on chronic respiratory failure with hypoxia secondary to COPD exacerbation -Anxious complaining shortness of breath, but currently satting 100% on 4 L of oxygen  Apparently baseline O2 demand 2-4 L Continue duo nebs, Mucinex, Dulera, doxycycline. -Switching from IV Solu-Medrol to p.o. 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   Correlation with endoscopy is recommended Patient will be temporarily placed n.p.o. in anticipation for possible bronchoscopy in the morning  -Pulmonologist Dr. Elsworth Soho consulted, monitoring patient closely, planning for bronchoscopy today 03/22/2022  Elevated MCV MCV 100.7 Folate levels and vitamin B12 levels will be checked   Prior Thoracic compression fracture Stable, this occurred in 2019   Essential hypertension Continue losartan   GERD Continue Protonix   Mixed hyperlipidemia Continue Lipitor   Pulmonary embolism Temporarily hold off on Eliquis in anticipation for possible bronchoscopy by pulmonologist -Stable   Tobacco abuse Counseled on tobacco abuse cessation, patient states that he is working on cessation Declined NicoDerm  Severe acute on chronic debility -Scooter/wheelchair bound -PT OT, consulted for evaluation recommendation, encouraging patient to ambulate     Skin Assessment: I have examined the patient's skin and I agree with the wound assessment as performed by wound care team As outlined belowe: Pressure Injury 03/14/22 Coccyx Stage 2 -  Partial thickness loss of dermis presenting as a shallow open injury with a red, pink wound bed without slough. (Active)  03/14/22 1211  Location: Coccyx  Location Orientation:   Staging: Stage 2 -  Partial thickness loss of dermis presenting as a shallow open injury with a red, pink wound bed without slough.  Wound Description (Comments):   Present on Admission:   Dressing Type Foam - Lift dressing to assess site every shift 03/21/22 0900    ----------------------------------------------------------------------------------------------------------------  Consults: Pulmonology  DVT prophylaxis:  SCDs Start: 03/20/22 0113   Code Status:   Code Status: Full Code  Family Communication: No family member present at bedside-  The above findings and plan of care has been discussed with patient  in detail,  they expressed understanding and agreement of above. -Advance care planning has been discussed.   Admission status:   Status is: Inpatient Remains inpatient appropriate because: Needing breathing treatment, supplemental oxygen, needs O2 supplement   Disposition: From home Per PT OT recommendation SNF-patient is agreeable    Procedures:   No admission procedures for hospital encounter.   Antimicrobials:  Anti-infectives (From admission, onward)    Start     Dose/Rate Route Frequency Ordered Stop   03/20/22 1000  doxycycline (VIBRA-TABS) tablet 100 mg        100 mg Oral Every 12 hours 03/20/22 0143     03/19/22 2115  doxycycline (VIBRA-TABS) tablet 100 mg        100 mg Oral  Once 03/19/22 2113 03/19/22 2133        Medication:   atorvastatin  40 mg Oral Daily  budesonide (PULMICORT) nebulizer solution  0.25 mg Nebulization BID   doxycycline  100 mg Oral Q12H   furosemide  40 mg Intravenous Once   guaiFENesin-dextromethorphan  10 mL Oral Q8H   ipratropium-albuterol  3 mL Nebulization Q6H   losartan  100 mg Oral Daily   mouth rinse  15 mL Mouth Rinse BID   methylPREDNISolone (SOLU-MEDROL) injection  40 mg Intravenous Once   mometasone-formoterol  2 puff Inhalation BID   pantoprazole  40 mg Oral Daily   polyethylene glycol  17 g Oral Daily   predniSONE  50 mg Oral Q breakfast    acetaminophen **OR** acetaminophen, ALPRAZolam, ipratropium-albuterol, simethicone   Objective:   Vitals:   03/22/22 0429 03/22/22 0800 03/22/22 0803 03/22/22 0811  BP: 130/86     Pulse: 89     Resp: 18     Temp: 98.1  F (36.7 C)     TempSrc:      SpO2: 98% 98% 100% 100%  Weight:      Height:        Intake/Output Summary (Last 24 hours) at 03/22/2022 1137 Last data filed at 03/22/2022 0809 Gross per 24 hour  Intake 720 ml  Output 500 ml  Net 220 ml   Filed Weights   03/20/22 0128  Weight: 65.8 kg     Examination:    General:  AAO x 3,  cooperative, no distress;   HEENT:  Normocephalic, PERRL, otherwise with in Normal limits   Neuro:  CNII-XII intact. , normal motor and sensation, reflexes intact   Lungs:   , Respirations unlabored,  Mild diffuse wheezes /mild lower lobe crackles  Cardio:    S1/S2, RRR, No murmure, No Rubs or Gallops   Abdomen:  Soft, non-tender, bowel sounds active all four quadrants, no guarding or peritoneal signs.  Muscular  skeletal:  Limited exam -global generalized weaknesses - in bed, able to move all 4 extremities,   2+ pulses,  symmetric, No pitting edema  Skin:  Dry, warm to touch, negative for any Rashes,  Wounds: Please see nursing documentation  Pressure Injury 03/14/22 Coccyx Stage 2 -  Partial thickness loss of dermis presenting as a shallow open injury with a red, pink wound bed without slough. (Active)  03/14/22 1211  Location: Coccyx  Location Orientation:   Staging: Stage 2 -  Partial thickness loss of dermis presenting as a shallow open injury with a red, pink wound bed without slough.  Wound Description (Comments):   Present on Admission:   Dressing Type Foam - Lift dressing to assess site every shift 03/21/22 0900          --------------------------------------------------------------------------------------------------------    LABs:     Latest Ref Rng & Units 03/20/2022    4:30 AM 03/19/2022    8:06 PM 03/18/2022    5:00 PM  CBC  WBC 4.0 - 10.5 K/uL 11.3   13.2   15.3    Hemoglobin 13.0 - 17.0 g/dL 11.4   13.8   14.9    Hematocrit 39.0 - 52.0 % 35.9   43.9   47.4    Platelets 150 - 400 K/uL 189   198   211        Latest Ref Rng  & Units 03/20/2022    4:30 AM 03/19/2022    8:06 PM 03/18/2022    5:00 PM  CMP  Glucose 70 - 99 mg/dL 235   146   105    BUN 6 - 20 mg/dL  23   27   21     Creatinine 0.61 - 1.24 mg/dL 0.92   1.17   1.04    Sodium 135 - 145 mmol/L 141   140   142    Potassium 3.5 - 5.1 mmol/L 3.5   3.5   4.0    Chloride 98 - 111 mmol/L 104   100   99    CO2 22 - 32 mmol/L 29   31   33    Calcium 8.9 - 10.3 mg/dL 8.4   8.9   9.2    Total Protein 6.5 - 8.1 g/dL 4.9      Total Bilirubin 0.3 - 1.2 mg/dL 0.1      Alkaline Phos 38 - 126 U/L 89      AST 15 - 41 U/L 29      ALT 0 - 44 U/L 64           Micro Results Recent Results (from the past 240 hour(s))  Respiratory (~20 pathogens) panel by PCR     Status: None   Collection Time: 03/13/22  8:04 AM   Specimen: Nasopharyngeal Swab; Respiratory  Result Value Ref Range Status   Adenovirus NOT DETECTED NOT DETECTED Final   Coronavirus 229E NOT DETECTED NOT DETECTED Final    Comment: (NOTE) The Coronavirus on the Respiratory Panel, DOES NOT test for the novel  Coronavirus (2019 nCoV)    Coronavirus HKU1 NOT DETECTED NOT DETECTED Final   Coronavirus NL63 NOT DETECTED NOT DETECTED Final   Coronavirus OC43 NOT DETECTED NOT DETECTED Final   Metapneumovirus NOT DETECTED NOT DETECTED Final   Rhinovirus / Enterovirus NOT DETECTED NOT DETECTED Final   Influenza A NOT DETECTED NOT DETECTED Final   Influenza B NOT DETECTED NOT DETECTED Final   Parainfluenza Virus 1 NOT DETECTED NOT DETECTED Final   Parainfluenza Virus 2 NOT DETECTED NOT DETECTED Final   Parainfluenza Virus 3 NOT DETECTED NOT DETECTED Final   Parainfluenza Virus 4 NOT DETECTED NOT DETECTED Final   Respiratory Syncytial Virus NOT DETECTED NOT DETECTED Final   Bordetella pertussis NOT DETECTED NOT DETECTED Final   Bordetella Parapertussis NOT DETECTED NOT DETECTED Final   Chlamydophila pneumoniae NOT DETECTED NOT DETECTED Final   Mycoplasma pneumoniae NOT DETECTED NOT DETECTED Final     Comment: Performed at [facility], [address] [place], [place] [address]  Resp Panel by RT-PCR (Flu A&B, Covid) Nasopharyngeal Swab     Status: None   Collection Time: 03/19/22 11:30 PM   Specimen: Nasopharyngeal Swab; Nasopharyngeal(NP) swabs in vial transport medium  Result Value Ref Range Status   SARS Coronavirus 2 by RT PCR NEGATIVE NEGATIVE Final    Comment: (NOTE) SARS-CoV-2 target nucleic acids are NOT DETECTED.  The SARS-CoV-2 RNA is generally detectable in upper respiratory specimens during the acute phase of infection. The lowest concentration of SARS-CoV-2 viral copies this assay can detect is 138 copies/mL. A negative result does not preclude SARS-Cov-2 infection and should not be used as the sole basis for treatment or other patient management decisions. A negative result may occur with  improper specimen collection/handling, submission of specimen other than nasopharyngeal swab, presence of viral mutation(s) within the areas targeted by this assay, and inadequate number of viral copies(<138 copies/mL). A negative result must be combined with clinical observations, patient history, and epidemiological information. The expected result is Negative.  Fact Sheet for Patients:  EntrepreneurPulse.com.au  Fact Sheet for Healthcare Providers:  IncredibleEmployment.be  This  test is no t yet approved or cleared by the [place] and  has been authorized for detection and/or diagnosis of SARS-CoV-2 by FDA under an Emergency Use Authorization (EUA). This EUA will remain  in effect (meaning this test can be used) for the duration of the COVID-19 declaration under Section 564(b)(1) of the Act, 21 U.S.C.section 360bbb-3(b)(1), unless the authorization is terminated  or revoked sooner.       Influenza A by PCR NEGATIVE NEGATIVE Final   Influenza B by PCR NEGATIVE NEGATIVE Final    Comment: (NOTE) The Xpert Xpress  SARS-CoV-2/FLU/RSV plus assay is intended as an aid in the diagnosis of influenza from Nasopharyngeal swab specimens and should not be used as a sole basis for treatment. Nasal washings and aspirates are unacceptable for Xpert Xpress SARS-CoV-2/FLU/RSV testing.  Fact Sheet for Patients: EntrepreneurPulse.com.au  Fact Sheet for Healthcare Providers: IncredibleEmployment.be  This test is not yet approved or cleared by the [place] FDA and has been authorized for detection and/or diagnosis of SARS-CoV-2 by FDA under an Emergency Use Authorization (EUA). This EUA will remain in effect (meaning this test can be used) for the duration of the COVID-19 declaration under Section 564(b)(1) of the Act, 21 U.S.C. section 360bbb-3(b)(1), unless the authorization is terminated or revoked.  Performed at [facility], [address] [place], [place] [address]     Radiology Reports No results found.  SIGNED: Deatra James, MD, FHM. [facility],  Pager (please use [email or web address] to page/text) Please use Epic Secure Chat for non-urgent communication (7AM-7PM)  If 7PM-7AM, please contact night-coverage [email or web address], 03/22/2022, 11:37 AM

## 2022-03-22 NOTE — Anesthesia Preprocedure Evaluation (Addendum)
Anesthesia Evaluation  Patient identified by MRN, date of birth, ID band Patient awake    Reviewed: Allergy & Precautions, NPO status , Patient's Chart, lab work & pertinent test results  Airway Mallampati: II  TM Distance: >3 FB Neck ROM: Full    Dental  (+) Dental Advisory Given, Missing, Chipped   Pulmonary shortness of breath and Long-Term Oxygen Therapy, pneumonia, COPD,  COPD inhaler and oxygen dependent, Current Smoker and Patient abstained from smoking.,           Cardiovascular Exercise Tolerance: Poor hypertension, Pt. on medications Normal cardiovascular exam Rhythm:Regular Rate:Normal     Neuro/Psych  Headaches, PSYCHIATRIC DISORDERS Anxiety Depression Bipolar Disorder  Neuromuscular disease    GI/Hepatic GERD  Medicated and Controlled,(+)     substance abuse  cocaine use and marijuana use,   Endo/Other  negative endocrine ROS  Renal/GU negative Renal ROS  negative genitourinary   Musculoskeletal  (+) Arthritis , Osteoarthritis,    Abdominal   Peds negative pediatric ROS (+)  Hematology negative hematology ROS (+)   Anesthesia Other Findings IMPRESSION: Severe emphysema. Superimposed airway inflammation. Improved airway impaction within the lower lobes bilaterally.  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 Correlation with endoscopy is recommended.  Moderate multi-vessel coronary artery calcification.  Morphologic changes in keeping with pulmonary arterial hypertension.  Numerous thoracic compression deformities, stable since prior examination.  Aortic Atherosclerosis (ICD10-I70.0) and Emphysema (ICD10-J43.9).   Electronically Signed   By: Fidela Salisbury M.D.   On: 03/19/2022 00:18  Reproductive/Obstetrics negative OB ROS                            Anesthesia  Physical Anesthesia Plan  ASA: 4  Anesthesia Plan: General   Post-op Pain Management: Minimal or no pain anticipated   Induction: Intravenous  PONV Risk Score and Plan: Ondansetron  Airway Management Planned: Oral ETT  Additional Equipment:   Intra-op Plan:   Post-operative Plan: Possible Post-op intubation/ventilation and Extubation in OR  Informed Consent: I have reviewed the patients History and Physical, chart, labs and discussed the procedure including the risks, benefits and alternatives for the proposed anesthesia with the patient or authorized representative who has indicated his/her understanding and acceptance.     Dental advisory given  Plan Discussed with: CRNA and Surgeon  Anesthesia Plan Comments: (postop intubation and ventilation was discussed, patient agreed with plan)       Anesthesia Quick Evaluation

## 2022-03-22 NOTE — Anesthesia Postprocedure Evaluation (Signed)
Anesthesia Post Note  Patient: Tony Sullivan  Procedure(s) Performed: FLEXIBLE BRONCHOSCOPY (Left: Chest)  Patient location during evaluation: PACU Anesthesia Type: General Level of consciousness: awake and alert and oriented Pain management: pain level controlled Vital Signs Assessment: post-procedure vital signs reviewed and stable Respiratory status: spontaneous breathing, nonlabored ventilation, respiratory function stable and patient connected to nasal cannula oxygen Cardiovascular status: blood pressure returned to baseline and stable Postop Assessment: no apparent nausea or vomiting Anesthetic complications: no   No notable events documented.   Last Vitals:  Vitals:   03/22/22 1452 03/22/22 1500  BP: (!) 127/104 (!) 133/93  Pulse:    Resp: (!) 23   Temp:  (!) 36.3 C  SpO2:  97%    Last Pain:  Vitals:   03/22/22 1412  TempSrc:   PainSc: 5                  Shantana Christon C Kinza Gouveia

## 2022-03-22 NOTE — Progress Notes (Signed)
PT Cancellation Note  Patient Details Name: PIERSON VANTOL MRN: [id number] DOB: 1961-04-30   Cancelled Treatment:    Reason Eval/Treat Not Completed: Patient at procedure or test/unavailable.  Patient declined therapy in AM and out for procedure in PM.   2:58 PM, 03/22/22 Lonell Grandchild, MPT Physical Therapist with [facility] [phone] office [phone] mobile phone

## 2022-03-23 ENCOUNTER — Inpatient Hospital Stay (HOSPITAL_COMMUNITY): Payer: Medicaid Other

## 2022-03-23 DIAGNOSIS — J441 Chronic obstructive pulmonary disease with (acute) exacerbation: Secondary | ICD-10-CM | POA: Diagnosis not present

## 2022-03-23 LAB — BASIC METABOLIC PANEL
Anion gap: 7 (ref 5–15)
BUN: 19 mg/dL (ref 6–20)
CO2: 39 mmol/L — ABNORMAL HIGH (ref 22–32)
Calcium: 8.5 mg/dL — ABNORMAL LOW (ref 8.9–10.3)
Chloride: 95 mmol/L — ABNORMAL LOW (ref 98–111)
Creatinine, Ser: 0.73 mg/dL (ref 0.61–1.24)
GFR, Estimated: >60 mL/min (ref >60)
Glucose, Bld: 81 mg/dL (ref 70–99)
Potassium: 3.8 mmol/L (ref 3.5–5.1)
Sodium: 141 mmol/L (ref 135–145)

## 2022-03-23 LAB — BRAIN NATRIURETIC PEPTIDE: B Natriuretic Peptide: 47 pg/mL (ref 0.0–100.0)

## 2022-03-23 IMAGING — DX DG CHEST 1V PORT
2 series · 2 of 2 positions shown · non-contrast
Comparison: [DATE] and chest CT dated [DATE].

CLINICAL DATA: Shortness of breath starting this morning.

EXAM:
PORTABLE CHEST 1 VIEW

[chest ap]
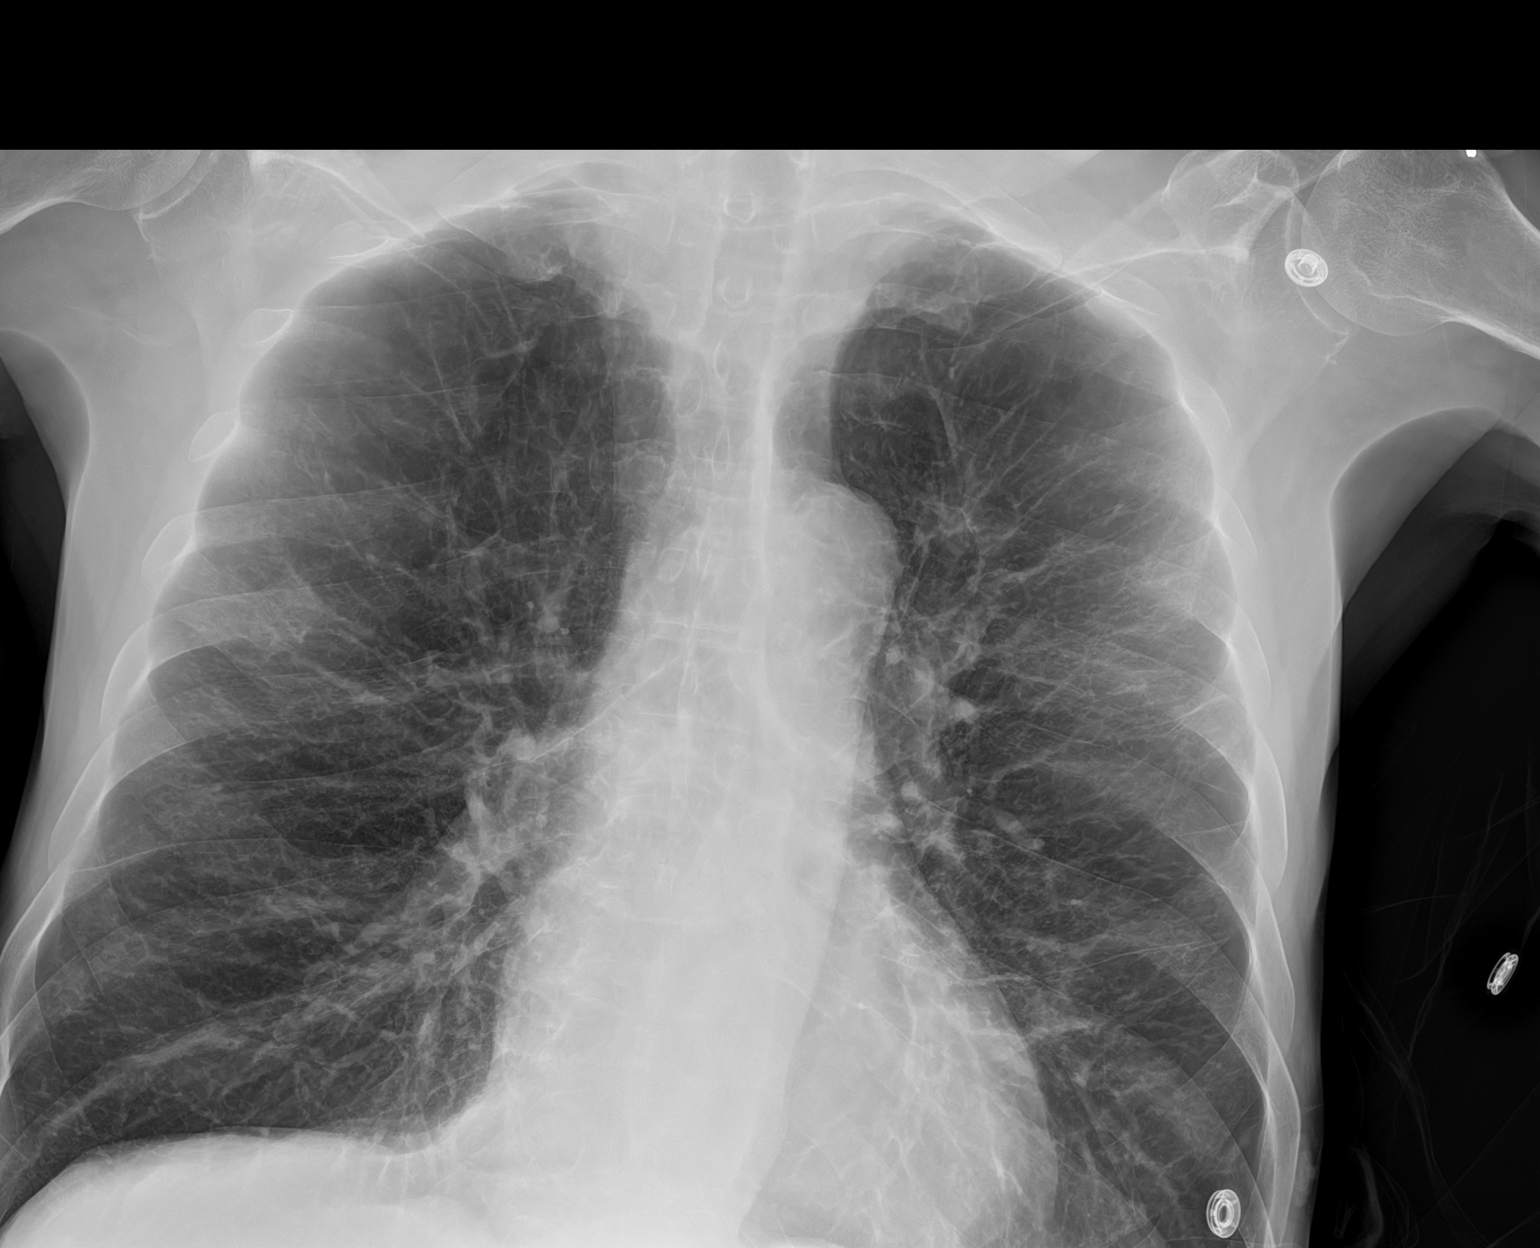

[chest ap grid]
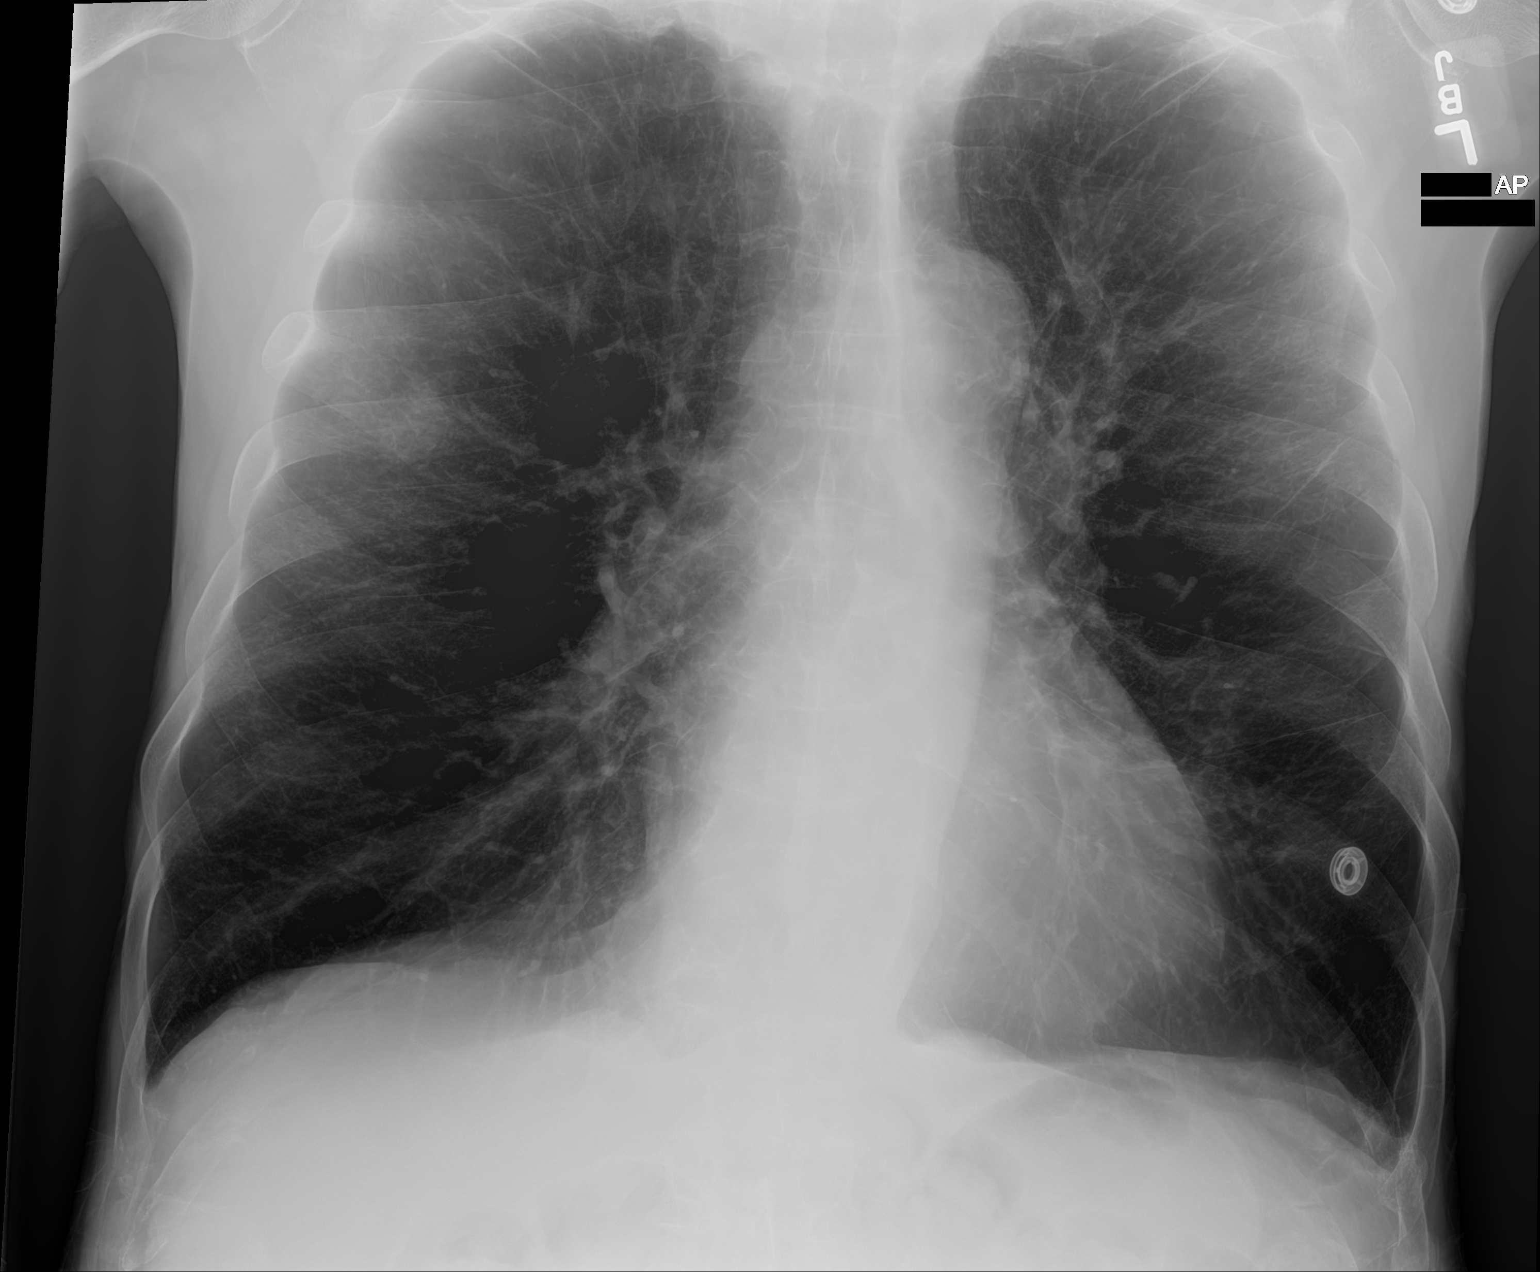

[2 of 2 positions shown; findings below may reference images not displayed]

FINDINGS: The previously demonstrated healing callus associated with a
fracture of the right posterior 6th rib is unchanged. The previously
demonstrated 2nd nodular density more superiorly is no longer seen.
Currently, the lungs are clear and remain hyperexpanded. Normal
sized heart. Mildly tortuous and calcified thoracic aorta.
IMPRESSION: 1. Resolved nodular density in the right upper lung zone.
2. Stable changes of COPD.

## 2022-03-23 MED ORDER — APIXABAN 5 MG PO TABS: 5.0000 mg | ORAL_TABLET | Freq: Two times a day (BID) | ORAL | Status: DC

## 2022-03-23 MED ORDER — LOXAPINE SUCCINATE 5 MG PO CAPS
10.0000 mg | ORAL_CAPSULE | Freq: Every day | ORAL | Status: DC
  Filled 2022-03-23 (×4): qty 2

## 2022-03-23 MED ORDER — APIXABAN 5 MG PO TABS
5.0000 mg | ORAL_TABLET | Freq: Two times a day (BID) | ORAL | Status: DC
Start: 2022-03-25 — End: 2022-03-23

## 2022-03-23 MED ORDER — LATANOPROST 0.005 % OP SOLN
1.0000 [drp] | Freq: Every day | OPHTHALMIC | Status: DC
  Administered 2022-03-23 – 2022-03-24 (×2): 1 [drp] via OPHTHALMIC

## 2022-03-23 MED ORDER — BENZTROPINE MESYLATE 1 MG PO TABS
0.5000 mg | ORAL_TABLET | Freq: Every day | ORAL | Status: DC
  Filled 2022-03-23: qty 1

## 2022-03-23 MED ORDER — GUAIFENESIN ER 600 MG PO TB12
600.0000 mg | ORAL_TABLET | Freq: Two times a day (BID) | ORAL | Status: DC
  Administered 2022-03-23 – 2022-03-25 (×4): 600 mg via ORAL
  Filled 2022-03-23 (×4): qty 1

## 2022-03-23 MED ORDER — ZOLPIDEM TARTRATE 5 MG PO TABS: 5.0000 mg | ORAL_TABLET | Freq: Every evening | ORAL | Status: DC | PRN

## 2022-03-23 MED ORDER — DORZOLAMIDE HCL 2 % OP SOLN
1.0000 [drp] | Freq: Two times a day (BID) | OPHTHALMIC | Status: DC
  Administered 2022-03-23 – 2022-03-25 (×5): 1 [drp] via OPHTHALMIC

## 2022-03-23 MED ORDER — GUAIFENESIN-DM 100-10 MG/5ML PO SYRP
10.0000 mL | ORAL_SOLUTION | ORAL | Status: DC | PRN
  Administered 2022-03-23: 10 mL via ORAL
  Filled 2022-03-23: qty 10

## 2022-03-23 MED ORDER — GABAPENTIN 400 MG PO CAPS: 400.0000 mg | ORAL_CAPSULE | Freq: Three times a day (TID) | ORAL | Status: DC

## 2022-03-23 MED ORDER — APIXABAN 5 MG PO TABS
10.0000 mg | ORAL_TABLET | Freq: Two times a day (BID) | ORAL | Status: DC
Start: 2022-03-23 — End: 2022-03-23
  Filled 2022-03-23: qty 2

## 2022-03-23 MED ORDER — GABAPENTIN 100 MG PO CAPS
100.0000 mg | ORAL_CAPSULE | Freq: Three times a day (TID) | ORAL | Status: DC
  Administered 2022-03-23 – 2022-03-25 (×7): 100 mg via ORAL
  Filled 2022-03-23 (×7): qty 1

## 2022-03-23 MED ORDER — APIXABAN 5 MG PO TABS
5.0000 mg | ORAL_TABLET | Freq: Two times a day (BID) | ORAL | Status: DC
  Administered 2022-03-23 – 2022-03-25 (×5): 5 mg via ORAL
  Filled 2022-03-23 (×4): qty 1

## 2022-03-23 NOTE — Progress Notes (Signed)
PROGRESS NOTE    Patient: Tony Sullivan                            PCP: [name], FNP                    DOB: 1961/05/11            DOA: 03/19/2022 KZL:[id number]             DOS: 03/23/2022, 10:15 AM   LOS: 3 days   Date of Service: The patient was seen and examined on 03/23/2022  Subjective:   The patient was seen and examined this morning, stable no acute distress, on 4 L of oxygen, satting 98%  Status post bronchoscopy, tolerated procedure well-Per Dr. Elsworth Soho patient has upper lobe mass biopsy was obtained -the patient and family was made aware   Brief Narrative:    Tony Sullivan is a 61 y.o. male with medical history significant of chronic respiratory failure with hypoxia secondary to COPD on supplemental oxygen via [place] at 3 LPM, hyperlipidemia, hypertension, GERD, tobacco abuse who presents to the emergency department due to blood in sputum which started about 2 hours PTA.   Patient presented to ED 2 nights ago with complaints of several days of wheezing and shortness of breath, he also complained of chest tightness.  CT scan was done which showed a mass that was reported that he (the patient) was aware of and was following with a pulmonologist.  He was treated for a COPD exacerbation, symptoms improved and patient was discharged home, though, patient did not want to be discharged due to him still having some shortness of breath.  Patient states that after he got home, he noted blood when he coughs and since is still had some shortness of breath, he decided to return to the ED for further evaluation and management. Patient is a chronic smoker who states that he has cut down a lot on smoking and was trying to quit.  He denies fever, chills, headache, nausea, vomiting, abdominal pain.   Patient was recently admitted from 5/20 to 05/19 due to acute on chronic hypoxemic respiratory failure due to pneumonia with possible aspiration and then later also noted to have PE, he was treated  with Levaquin and started on Eliquis.  Oxygen requirement at admission (6 L) was weaned down to baseline oxygen requirement at 3 LPM   ED Course:  Pt was intermittently tachypneic, other vital signs are within normal range.  O2 sat was 98-100% on supplemental oxygen 4 LPM.  Work-up in the ED showed normal CBC except for leukocytosis and MCV of 100.7.  BMP was normal except for BUN of 21, magnesium 2.5, troponin x2 was flat at 15.   Influenza A, B, SARS coronavirus 2 was negative. Chest x-ray showed emphysema and bronchitis changes without acute airspace disease CT chest with contrast showed severe emphysema. Superimposed airway inflammation. Improved airway- impaction within the lower lobes bilaterally.   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 Correlation with endoscopy is recommended. Solumedrol, magnesium and potassium were given, breathing treatment with albuterol was provided and doxycycline was given as well.      Assessment & Plan:   Principal Problem:   COPD with acute exacerbation ([place]) Active Problems:   Tobacco abuse   Essential hypertension   GERD (gastroesophageal reflux disease)  Cigarette smoker   Acute on chronic respiratory failure with hypoxia (HCC)   Mixed hyperlipidemia   Elevated MCV   Thoracic compression fracture (HCC)   Hemoptysis   Mass of upper lobe of left lung     Assessment and Plan:   Acute on chronic respiratory failure with hypoxia secondary to COPD exacerbation -Improved shortness of breath, back to baseline 4 L of oxygen, satting 98% Complaining of shortness of breath with minimal exertion  Continue duo nebs, Mucinex, Dulera, doxycycline. -Switching from IV Solu-Medrol to p.o. prednisone  -Pulmonology Dr. 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 -High suspicious of lung CA, tumor marker alpha-fetoprotein ordered  CT chest with contrast showed stable endobronchial mass within the left upper lobar pulmonary bronchus with extension to multiple peripheral segmental bronchi.     Elevated MCV MCV 100.7 Folate levels and vitamin B12 levels will be checked   Prior Thoracic compression fracture Stable, this occurred in 2019   Essential hypertension Continue losartan   GERD Continue Protonix   Mixed hyperlipidemia Continue Lipitor   Pulmonary embolism T-Eliquis was temporarily on hold for bronchoscopy, status post bronchoscopy mild tinged blood sputum,  - Restarting Eliquis   Tobacco abuse Counseled on tobacco abuse cessation, patient states that he is working on cessation Declined NicoDerm  Severe acute on chronic debility -Scooter/wheelchair bound -PT OT, consulted for evaluation recommendation, encouraging patient to ambulate     Skin Assessment: I have examined the patient's skin and I agree with the wound assessment as performed by wound care team As outlined belowe:    ----------------------------------------------------------------------------------------------------------------  Consults: Pulmonology  DVT prophylaxis:  SCDs Start: 03/20/22 0113 apixaban (ELIQUIS) tablet 5 mg   Code Status:   Code Status: Full Code  Family Communication: Discussed with patient, and brother was present at bedside yesterday The above findings and plan of care has been discussed with patient  in detail,  they expressed understanding and agreement of above. -Advance care planning has been discussed.   Admission status:   Status is: Inpatient Remains inpatient  appropriate because: Needing breathing treatment, supplemental oxygen, needs O2 supplement   Disposition: From home Per PT OT recommendation SNF-patient is agreeable in 1-2 days    Procedures:   No admission procedures for hospital encounter.   Antimicrobials:  Anti-infectives (From admission, onward)    Start     Dose/Rate Route Frequency Ordered Stop   03/20/22 1000  doxycycline (VIBRA-TABS) tablet 100 mg        100 mg Oral Every 12 hours 03/20/22 0143     03/19/22 2115  doxycycline (VIBRA-TABS) tablet 100 mg        100 mg Oral  Once 03/19/22 2113 03/19/22 2133        Medication:   [START ON 03/24/2022] apixaban  5 mg Oral BID   atorvastatin  40 mg Oral Daily   benztropine  0.5 mg Oral QHS   budesonide (PULMICORT) nebulizer solution  0.25 mg Nebulization BID   dorzolamide  1 drop Left Eye BID   doxycycline  100 mg Oral Q12H   gabapentin  100 mg Oral Q8H   ipratropium-albuterol  3 mL Nebulization Q6H  latanoprost  1 drop Both Eyes QHS   losartan  100 mg Oral Daily   loxapine  10 mg Oral QHS   mouth rinse  15 mL Mouth Rinse BID   mometasone-formoterol  2 puff Inhalation BID   pantoprazole  40 mg Oral Daily   polyethylene glycol  17 g Oral Daily   predniSONE  50 mg Oral Q breakfast    acetaminophen **OR** acetaminophen, ALPRAZolam, ipratropium-albuterol, simethicone   Objective:   Vitals:   03/23/22 0508 03/23/22 0733 03/23/22 0735 03/23/22 0739  BP: 111/83     Pulse: 83     Resp: 16     Temp: 98.4 F (36.9 C)     TempSrc:      SpO2: 98% 98% 98% 98%  Weight:      Height:        Intake/Output Summary (Last 24 hours) at 03/23/2022 1015 Last data filed at 03/23/2022 0100 Gross per 24 hour  Intake 940 ml  Output 1000 ml  Net -60 ml   Filed Weights   03/20/22 0128  Weight: 65.8 kg     Examination:     Physical Exam:   General:  AAO x 3,  cooperative, no distress;   HEENT:  Normocephalic, PERRL, otherwise with in Normal limits   Neuro:   CNII-XII intact. , normal motor and sensation, reflexes intact   Lungs:   Clear to auscultation BL, Respirations unlabored,  No wheezes / crackles  Cardio:    S1/S2, RRR, No murmure, No Rubs or Gallops   Abdomen:  Soft, non-tender, bowel sounds active all four quadrants, no guarding or peritoneal signs.  Muscular  skeletal:  Limited exam - sever global generalized weaknesses -Especially in lower extremity, wheelchair, scooter bound - in bed, able to move  extremities,   2+ pulses,  symmetric, No pitting edema  Skin:  Dry, warm to touch, negative for any Rashes,  Wounds: Please see nursing documentation        ----------------------------------------------------------------------------    LABs:     Latest Ref Rng & Units 03/20/2022    4:30 AM 03/19/2022    8:06 PM 03/18/2022    5:00 PM  CBC  WBC 4.0 - 10.5 K/uL 11.3   13.2   15.3    Hemoglobin 13.0 - 17.0 g/dL 11.4   13.8   14.9    Hematocrit 39.0 - 52.0 % 35.9   43.9   47.4    Platelets 150 - 400 K/uL 189   198   211        Latest Ref Rng & Units 03/23/2022    8:46 AM 03/20/2022    4:30 AM 03/19/2022    8:06 PM  CMP  Glucose 70 - 99 mg/dL 81   235   146    BUN 6 - 20 mg/dL 19   23   27     Creatinine 0.61 - 1.24 mg/dL 0.73   0.92   1.17    Sodium 135 - 145 mmol/L 141   141   140    Potassium 3.5 - 5.1 mmol/L 3.8   3.5   3.5    Chloride 98 - 111 mmol/L 95   104   100    CO2 22 - 32 mmol/L 39   29   31    Calcium 8.9 - 10.3 mg/dL 8.5   8.4   8.9    Total Protein 6.5 - 8.1 g/dL  4.9     Total Bilirubin  0.3 - 1.2 mg/dL  0.1     Alkaline Phos 38 - 126 U/L  89     AST 15 - 41 U/L  29     ALT 0 - 44 U/L  64          Micro Results Recent Results (from the past 240 hour(s))  Resp Panel by RT-PCR (Flu A&B, Covid) Nasopharyngeal Swab     Status: None   Collection Time: 03/19/22 11:30 PM   Specimen: Nasopharyngeal Swab; Nasopharyngeal(NP) swabs in vial transport medium  Result Value Ref Range Status   SARS Coronavirus 2 by  RT PCR NEGATIVE NEGATIVE Final    Comment: (NOTE) SARS-CoV-2 target nucleic acids are NOT DETECTED.  The SARS-CoV-2 RNA is generally detectable in upper respiratory specimens during the acute phase of infection. The lowest concentration of SARS-CoV-2 viral copies this assay can detect is 138 copies/mL. A negative result does not preclude SARS-Cov-2 infection and should not be used as the sole basis for treatment or other patient management decisions. A negative result may occur with  improper specimen collection/handling, submission of specimen other than nasopharyngeal swab, presence of viral mutation(s) within the areas targeted by this assay, and inadequate number of viral copies(<138 copies/mL). A negative result must be combined with clinical observations, patient history, and epidemiological information. The expected result is Negative.  Fact Sheet for Patients:  EntrepreneurPulse.com.au  Fact Sheet for Healthcare Providers:  IncredibleEmployment.be  This test is no t yet approved or cleared by the [place] FDA and  has been authorized for detection and/or diagnosis of SARS-CoV-2 by FDA under an Emergency Use Authorization (EUA). This EUA will remain  in effect (meaning this test can be used) for the duration of the COVID-19 declaration under Section 564(b)(1) of the Act, 21 U.S.C.section 360bbb-3(b)(1), unless the authorization is terminated  or revoked sooner.       Influenza A by PCR NEGATIVE NEGATIVE Final   Influenza B by PCR NEGATIVE NEGATIVE Final    Comment: (NOTE) The Xpert Xpress SARS-CoV-2/FLU/RSV plus assay is intended as an aid in the diagnosis of influenza from Nasopharyngeal swab specimens and should not be used as a sole basis for treatment. Nasal washings and aspirates are unacceptable for Xpert Xpress SARS-CoV-2/FLU/RSV testing.  Fact Sheet for Patients: EntrepreneurPulse.com.au  Fact Sheet  for Healthcare Providers: IncredibleEmployment.be  This test is not yet approved or cleared by the [place] FDA and has been authorized for detection and/or diagnosis of SARS-CoV-2 by FDA under an Emergency Use Authorization (EUA). This EUA will remain in effect (meaning this test can be used) for the duration of the COVID-19 declaration under Section 564(b)(1) of the Act, 21 U.S.C. section 360bbb-3(b)(1), unless the authorization is terminated or revoked.  Performed at [facility], [address], [place], [place] [address]   Culture, Respiratory w Gram Stain     Status: None (Preliminary result)   Collection Time: 03/22/22  2:01 PM   Specimen: Bronchial Alveolar Lavage; Respiratory  Result Value Ref Range Status   Specimen Description   Final    BRONCHIAL ALVEOLAR LAVAGE Performed at [facility], [address], [place], [place] [address]    Special Requests   Final    LEFT UPPER LUNG Performed at [facility], [address], [place], [place] [address]    Gram Stain   Final    FEW WBC PRESENT, PREDOMINANTLY PMN RARE GRAM POSITIVE COCCI Performed at [facility], [place] [address] [place], [place] [address]  Culture PENDING  Incomplete   Report Status PENDING  Incomplete    Radiology Reports DG CHEST PORT 1 VIEW  Result Date: 03/23/2022 CLINICAL DATA:  Shortness of breath starting this morning. EXAM: PORTABLE CHEST 1 VIEW COMPARISON:  03/21/2022 and chest CT dated 03/18/2022. FINDINGS: The previously demonstrated healing callus associated with a fracture of the right posterior 6th rib is unchanged. The previously demonstrated 2nd nodular density more superiorly is no longer seen. Currently, the lungs are clear and remain hyperexpanded. Normal sized heart. Mildly tortuous and calcified thoracic aorta. IMPRESSION: 1. Resolved nodular density in the right upper lung zone. 2. Stable changes of COPD. Electronically Signed   By: Claudie Revering M.D.   On:  03/23/2022 10:00    SIGNED: Deatra James, MD, FHM. [facility],  Pager (please use [email or web address] to page/text) Please use Epic Secure Chat for non-urgent communication (7AM-7PM)  If 7PM-7AM, please contact night-coverage [email or web address], 03/23/2022, 10:15 AM

## 2022-03-23 NOTE — Progress Notes (Signed)
ANTICOAGULATION CONSULT NOTE - Initial Consult  Pharmacy Consult for Eliquis Indication: pulmonary embolus  Allergies  Allergen Reactions   Penicillins Anaphylaxis    Has patient had a PCN reaction causing immediate rash, facial/tongue/throat swelling, SOB or lightheadedness with hypotension: Yes Has patient had a PCN reaction causing severe rash involving mucus membranes or skin necrosis: No Has patient had a PCN reaction that required hospitalization No Has patient had a PCN reaction occurring within the last 10 years: No If all of the above answers are "NO", then may proceed with Cephalosporin use.    Levocetirizine Other (See Comments)    Muscle cramps    Patient Measurements: Height: 5\' 11"  (180.3 cm) Weight: 65.8 kg (145 lb) IBW/kg (Calculated) : 75.3  Vital Signs: Temp: 98.4 F (36.9 C) (05/27 0508) BP: 111/83 (05/27 0508) Pulse Rate: 83 (05/27 0508)  Labs: No results for input(s): HGB, HCT, PLT, APTT, LABPROT, INR, HEPARINUNFRC, HEPRLOWMOCWT, CREATININE, CKTOTAL, CKMB, TROPONINIHS in the last 72 hours.  Estimated Creatinine Clearance: 81.5 mL/min (by C-G formula based on SCr of 0.92 mg/dL).   Medical History: Past Medical History:  Diagnosis Date   Anxiety    Arthritis    COPD (chronic obstructive pulmonary disease) ([place]) 2004   diagnosed in 2004, no PFT's to date.  Started on home O2 12/2013, after found to be desatting at PCP's office, and referred to pulmonology.   Depression    GERD (gastroesophageal reflux disease)    High blood pressure    Prediabetes    Seasonal allergies    Substance abuse (HCC)    Tobacco dependence     Medications:  Medications Prior to Admission  Medication Sig Dispense Refill Last Dose   acetaminophen (TYLENOL) 325 MG tablet Take 2 tablets (650 mg total) by mouth every 6 (six) hours as needed for mild pain (or Fever >/= 101). 30 tablet 1 Past Month   albuterol (PROVENTIL) (2.5 MG/3ML) 0.083% nebulizer solution Take 3 mLs (2.5  mg total) by nebulization every 4 (four) hours as needed for wheezing or shortness of breath. 75 mL 2 03/19/2022   albuterol (VENTOLIN HFA) 108 (90 Base) MCG/ACT inhaler Inhale 2 puffs into the lungs every 6 (six) hours as needed for wheezing or shortness of breath. 18 g 2 03/20/2022   apixaban (ELIQUIS) 5 MG TABS tablet Take 2 tablets (10 mg total) by mouth 2 (two) times daily for 7 days. 28 tablet 0 03/19/2022 at 1300   atorvastatin (LIPITOR) 40 MG tablet TAKE ONE TABLET BY MOUTH DAILY (Patient taking differently: Take 40 mg by mouth daily.) 30 tablet 0 03/19/2022   benztropine (COGENTIN) 0.5 MG tablet Take 0.5 mg by mouth at bedtime.   Past Week   Budeson-Glycopyrrol-Formoterol (BREZTRI AEROSPHERE) 160-9-4.8 MCG/ACT AERO Inhale 2 puffs into the lungs in the morning and at bedtime. 10.7 g 3 03/19/2022   cetirizine (ZYRTEC) 10 MG tablet Take 10 mg by mouth daily.   03/19/2022   diclofenac Sodium (VOLTAREN) 1 % GEL Apply 2 g topically 4 (four) times daily as needed (pain).   Past Month   dorzolamide (TRUSOPT) 2 % ophthalmic solution Place 1 drop into the left eye 2 (two) times daily.   Past Week   gabapentin (NEURONTIN) 100 MG capsule Take 1 capsule (100 mg total) by mouth 2 (two) times daily. 60 capsule 0 Past Week   guaiFENesin 200 MG tablet Take 2 tablets (400 mg total) by mouth every 6 (six) hours as needed for cough or to loosen phlegm. [place]  suppository 0 03/19/2022   hydrochlorothiazide (HYDRODIURIL) 25 MG tablet Take 0.5 tablets (12.5 mg total) by mouth daily. 90 tablet 1 03/19/2022   hydrOXYzine (ATARAX) 25 MG tablet Take 1 tablet (25 mg total) by mouth 3 (three) times daily as needed for anxiety or nausea. 30 tablet 2 Past Week   latanoprost (XALATAN) 0.005 % ophthalmic solution Place 1 drop into both eyes at bedtime.   03/19/2022   losartan (COZAAR) 100 MG tablet Take 100 mg by mouth daily.   03/19/2022   loxapine (LOXITANE) 10 MG capsule Take 10 mg by mouth at bedtime.    Past Week   mirtazapine  (REMERON) 30 MG tablet Take 1 tablet (30 mg total) by mouth at bedtime. 30 tablet 3 Past Week   Multiple Vitamin (MULTIVITAMIN WITH MINERALS) TABS tablet Take 1 tablet by mouth daily. 120 tablet 2 03/19/2022   OXYGEN Inhale 3-4 L into the lungs as directed. 3-4 liters with sleep and exertion as needed for low oxygen   03/20/2022   pantoprazole (PROTONIX) 40 MG tablet Take 1 tablet (40 mg total) by mouth daily. 30 tablet 4 03/19/2022   polyethylene glycol (MIRALAX / GLYCOLAX) 17 g packet TAKE 17 GRAMS BY MOUTH DAILY (Patient taking differently: Take 17 g by mouth daily as needed for mild constipation.) 14 each 1 Past Month   predniSONE (DELTASONE) 20 MG tablet Take 1 tablet (20 mg total) by mouth daily. 30 tablet 0 03/19/2022   risperiDONE (RISPERDAL) 2 MG tablet Take 1 tablet (2 mg total) by mouth at bedtime. 30 tablet 3 Past Week   senna-docusate (SENOKOT-S) 8.6-50 MG tablet Take 2 tablets by mouth at bedtime. (Patient taking differently: Take 2 tablets by mouth at bedtime as needed for mild constipation.) 60 tablet 3 Past Week   simethicone (MYLICON) 80 MG chewable tablet Chew 1 tablet (80 mg total) by mouth 4 (four) times daily as needed for flatulence. 30 tablet 0 Past Month   apixaban (ELIQUIS) 5 MG TABS tablet Take 1 tablet (5 mg total) by mouth 2 (two) times daily. 120 tablet 0 UNK    Assessment: 61 y.o. male with medical history significant for COPD with chronic hypoxemia on 3 L nasal cannula, tobacco abuse, dyslipidemia, hypertension, anxiety, and GERD. hours ago.  This is occurred several times.  He has ongoing shortness of breath and is on chronic prednisone therapy as well as oral anticoagulant with apixaban for recently diagnosed PE on  03/15/22. Eliquis last dose taken 03/19/22. Patient now post bronchoscopy (03/22/22) and to restart eliquis today. Will complete week of 10mg  bid and then continue with 5mg  po bid.  Goal of Therapy:  Monitor platelets by anticoagulation protocol: Yes   Plan:   Eliquis 10mg  po bid x 2 days, then 5mg  po BID Monitor for S/S of bleeding  Isac Sarna, BS Pharm D, BCPS Clinical Pharmacist 03/23/2022,9:15 AM

## 2022-03-23 NOTE — Progress Notes (Signed)
TOC CSW has attempted to contact PT in regards to getting evaluated.  Sundai Probert Tarpley-Carter, MSW, LCSW-A Pronouns:  She/Her/Hers [facility] HealthTransitions of Care Clinical Social Worker Direct Number:  [phone] [email or web address]

## 2022-03-23 NOTE — Progress Notes (Signed)
Stated having trouble swallowing meats so added comments to only send soft meats to diet order.

## 2022-03-24 DIAGNOSIS — J441 Chronic obstructive pulmonary disease with (acute) exacerbation: Secondary | ICD-10-CM | POA: Diagnosis not present

## 2022-03-24 MED ORDER — PREDNISONE 20 MG PO TABS
40.0000 mg | ORAL_TABLET | Freq: Every day | ORAL | Status: DC
  Administered 2022-03-25: 40 mg via ORAL
  Filled 2022-03-24: qty 2

## 2022-03-24 MED ORDER — POLYETHYLENE GLYCOL 3350 17 G PO PACK
17.0000 g | PACK | Freq: Two times a day (BID) | ORAL | Status: DC
  Administered 2022-03-24 – 2022-03-25 (×3): 17 g via ORAL
  Filled 2022-03-24 (×3): qty 1

## 2022-03-24 NOTE — Progress Notes (Signed)
PROGRESS NOTE    Patient: Tony Sullivan                            PCP: [name], FNP                    DOB: 10/18/1961            DOA: 03/19/2022 ERX:[id number]             DOS: 03/24/2022, 12:19 PM   LOS: 4 days   Date of Service: The patient was seen and examined on 03/24/2022  Subjective:   The patient was seen and examined this morning, complained of constipation, still complaining shortness of breath with minimal exertion,  Percent on 4 L of oxygen  Brief Narrative:    Tony Sullivan is a 61 y.o. male with medical history significant of chronic respiratory failure with hypoxia secondary to COPD on supplemental oxygen via [place] at 3 LPM, hyperlipidemia, hypertension, GERD, tobacco abuse who presents to the emergency department due to blood in sputum which started about 2 hours PTA.   Patient presented to ED 2 nights ago with complaints of several days of wheezing and shortness of breath, he also complained of chest tightness.  CT scan was done which showed a mass that was reported that he (the patient) was aware of and was following with a pulmonologist.  He was treated for a COPD exacerbation, symptoms improved and patient was discharged home, though, patient did not want to be discharged due to him still having some shortness of breath.  Patient states that after he got home, he noted blood when he coughs and since is still had some shortness of breath, he decided to return to the ED for further evaluation and management. Patient is a chronic smoker who states that he has cut down a lot on smoking and was trying to quit.  He denies fever, chills, headache, nausea, vomiting, abdominal pain.   Patient was recently admitted from 5/20 to 05/19 due to acute on chronic hypoxemic respiratory failure due to pneumonia with possible aspiration and then later also noted to have PE, he was treated with Levaquin and started on Eliquis.  Oxygen requirement at admission (6 L) was weaned down to  baseline oxygen requirement at 3 LPM   ED Course:  Pt was intermittently tachypneic, other vital signs are within normal range.  O2 sat was 98-100% on supplemental oxygen 4 LPM.  Work-up in the ED showed normal CBC except for leukocytosis and MCV of 100.7.  BMP was normal except for BUN of 21, magnesium 2.5, troponin x2 was flat at 15.   Influenza A, B, SARS coronavirus 2 was negative. Chest x-ray showed emphysema and bronchitis changes without acute airspace disease CT chest with contrast showed severe emphysema. Superimposed airway inflammation. Improved airway- impaction within the lower lobes bilaterally.   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 Correlation with endoscopy is recommended. Solumedrol, magnesium and potassium were given, breathing treatment with albuterol was provided and doxycycline was given as well.      Assessment & Plan:   Principal Problem:   COPD with acute exacerbation ([place]) Active Problems:   Tobacco abuse   Essential hypertension   GERD (gastroesophageal reflux disease)   Cigarette smoker   Acute on chronic respiratory failure with hypoxia (H[facility])   Mixed hyperlipidemia  Elevated MCV   Thoracic compression fracture ([facility])   Hemoptysis   Mass of upper lobe of left lung     Assessment and Plan:   Acute on chronic respiratory failure with hypoxia secondary to COPD exacerbation -Still complaining of shortness of air with minimal exertion -Back to baseline 4 L of oxygen, satting 99%  Continue duo nebs, Mucinex, Dulera, doxycycline. -Switching from IV Solu-Medrol switch to >> p.o. prednisone  -Pulmonology Dr. 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     Elevated MCV MCV 100.7 Folate levels and vitamin B12 levels will be checked   Prior Thoracic compression fracture Stable, this occurred in 2019   Essential hypertension Continue losartan   GERD Continue Protonix   Mixed hyperlipidemia Continue Lipitor   Pulmonary embolism T-Eliquis was temporarily on hold for bronchoscopy, status post bronchoscopy mild tinged blood sputum,  - Restarting Eliquis   Tobacco abuse Counseled on tobacco abuse cessation, patient states that he is working on cessation -Accepted NicoDerm  Severe acute on chronic debility -Scooter/wheelchair bound -PT OT, consulted for evaluation recommendation, encouraging patient to ambulate     Skin Assessment: I have examined the patient's skin and I agree with the wound assessment as performed by wound care team As outlined belowe:    ----------------------------------------------------------------------------------------------------------------  Consults: Pulmonology  DVT prophylaxis:  SCDs Start: 03/20/22 0113 apixaban (ELIQUIS) tablet 5 mg   Code Status:   Code Status: Full Code  Family Communication: Discussed with patient,  The above findings and plan of care has been discussed with patient  in detail,  they expressed understanding and agreement of above. -Advance care planning has been discussed.   Admission status:   Status is: Inpatient Remains inpatient appropriate because: Needing breathing treatment, supplemental oxygen, needs O2 supplement   Disposition: From home Per PT OT recommendation SNF-patient is agreeable in 1-2  days    Procedures:   No admission procedures for hospital encounter.   Antimicrobials:  Anti-infectives (From admission, onward)    Start     Dose/Rate Route Frequency Ordered Stop   03/20/22 1000  doxycycline (VIBRA-TABS) tablet 100 mg        100 mg Oral Every 12 hours 03/20/22 0143     03/19/22 2115  doxycycline (VIBRA-TABS) tablet 100 mg        100 mg Oral  Once 03/19/22 2113 03/19/22 2133        Medication:   apixaban  5 mg Oral BID   atorvastatin  40 mg Oral Daily   benztropine  0.5 mg Oral QHS   budesonide (PULMICORT) nebulizer solution  0.25 mg Nebulization BID   dorzolamide  1 drop Left Eye BID   doxycycline  100 mg Oral Q12H   gabapentin  100 mg Oral Q8H   guaiFENesin  600 mg Oral BID   ipratropium-albuterol  3 mL Nebulization Q6H   latanoprost  1 drop Both Eyes QHS   losartan  100 mg Oral Daily   loxapine  10 mg Oral QHS  mouth rinse  15 mL Mouth Rinse BID   mometasone-formoterol  2 puff Inhalation BID   pantoprazole  40 mg Oral Daily   polyethylene glycol  17 g Oral Daily   predniSONE  50 mg Oral Q breakfast    acetaminophen **OR** acetaminophen, ALPRAZolam, guaiFENesin-dextromethorphan, ipratropium-albuterol, simethicone, zolpidem   Objective:   Vitals:   03/24/22 0515 03/24/22 0737 03/24/22 0744 03/24/22 0751  BP: 124/84     Pulse: 86     Resp: (!) 21     Temp: 97.7 F (36.5 C)     TempSrc: Oral     SpO2: 98% 99% 99% 99%  Weight:      Height:        Intake/Output Summary (Last 24 hours) at 03/24/2022 1219 Last data filed at 03/24/2022 0500 Gross per 24 hour  Intake 350 ml  Output 500 ml  Net -150 ml   Filed Weights   03/20/22 0128  Weight: 65.8 kg     Examination:      Physical Exam:   General:  AAO x 3,  cooperative, no distress; on 4 L of oxygen, shortness of breath  HEENT:  Normocephalic, PERRL, otherwise with in Normal limits   Neuro:  CNII-XII intact. , normal motor and sensation, reflexes intact   Lungs:   Clear to  auscultation BL, Respirations unlabored,  No wheezes / crackles  Cardio:    S1/S2, RRR, No murmure, No Rubs or Gallops   Abdomen:  Soft, non-tender, bowel sounds active all four quadrants, no guarding or peritoneal signs.  Muscular  skeletal:  Limited exam -sever global generalized weaknesses Especially bilateral lower extremities, wheelchair, scooter bound - in bed, able to move all 4 extremities,   2+ pulses,  symmetric, No pitting edema  Skin:  Dry, warm to touch, negative for any Rashes,  Wounds: Please see nursing documentation         ----------------------------------------------------------------------------    LABs:     Latest Ref Rng & Units 03/20/2022    4:30 AM 03/19/2022    8:06 PM 03/18/2022    5:00 PM  CBC  WBC 4.0 - 10.5 K/uL 11.3   13.2   15.3    Hemoglobin 13.0 - 17.0 g/dL 11.4   13.8   14.9    Hematocrit 39.0 - 52.0 % 35.9   43.9   47.4    Platelets 150 - 400 K/uL 189   198   211        Latest Ref Rng & Units 03/23/2022    8:46 AM 03/20/2022    4:30 AM 03/19/2022    8:06 PM  CMP  Glucose 70 - 99 mg/dL 81   235   146    BUN 6 - 20 mg/dL 19   23   27     Creatinine 0.61 - 1.24 mg/dL 0.73   0.92   1.17    Sodium 135 - 145 mmol/L 141   141   140    Potassium 3.5 - 5.1 mmol/L 3.8   3.5   3.5    Chloride 98 - 111 mmol/L 95   104   100    CO2 22 - 32 mmol/L 39   29   31    Calcium 8.9 - 10.3 mg/dL 8.5   8.4   8.9    Total Protein 6.5 - 8.1 g/dL  4.9     Total Bilirubin 0.3 - 1.2 mg/dL  0.1     Alkaline Phos 38 -  126 U/L  89     AST 15 - 41 U/L  29     ALT 0 - 44 U/L  64          Micro Results Recent Results (from the past 240 hour(s))  Resp Panel by RT-PCR (Flu A&B, Covid) Nasopharyngeal Swab     Status: None   Collection Time: 03/19/22 11:30 PM   Specimen: Nasopharyngeal Swab; Nasopharyngeal(NP) swabs in vial transport medium  Result Value Ref Range Status   SARS Coronavirus 2 by RT PCR NEGATIVE NEGATIVE Final    Comment: (NOTE) SARS-CoV-2 target  nucleic acids are NOT DETECTED.  The SARS-CoV-2 RNA is generally detectable in upper respiratory specimens during the acute phase of infection. The lowest concentration of SARS-CoV-2 viral copies this assay can detect is 138 copies/mL. A negative result does not preclude SARS-Cov-2 infection and should not be used as the sole basis for treatment or other patient management decisions. A negative result may occur with  improper specimen collection/handling, submission of specimen other than nasopharyngeal swab, presence of viral mutation(s) within the areas targeted by this assay, and inadequate number of viral copies(<138 copies/mL). A negative result must be combined with clinical observations, patient history, and epidemiological information. The expected result is Negative.  Fact Sheet for Patients:  EntrepreneurPulse.com.au  Fact Sheet for Healthcare Providers:  IncredibleEmployment.be  This test is no t yet approved or cleared by the [place] FDA and  has been authorized for detection and/or diagnosis of SARS-CoV-2 by FDA under an Emergency Use Authorization (EUA). This EUA will remain  in effect (meaning this test can be used) for the duration of the COVID-19 declaration under Section 564(b)(1) of the Act, 21 U.S.C.section 360bbb-3(b)(1), unless the authorization is terminated  or revoked sooner.       Influenza A by PCR NEGATIVE NEGATIVE Final   Influenza B by PCR NEGATIVE NEGATIVE Final    Comment: (NOTE) The Xpert Xpress SARS-CoV-2/FLU/RSV plus assay is intended as an aid in the diagnosis of influenza from Nasopharyngeal swab specimens and should not be used as a sole basis for treatment. Nasal washings and aspirates are unacceptable for Xpert Xpress SARS-CoV-2/FLU/RSV testing.  Fact Sheet for Patients: EntrepreneurPulse.com.au  Fact Sheet for Healthcare  Providers: IncredibleEmployment.be  This test is not yet approved or cleared by the [place] FDA and has been authorized for detection and/or diagnosis of SARS-CoV-2 by FDA under an Emergency Use Authorization (EUA). This EUA will remain in effect (meaning this test can be used) for the duration of the COVID-19 declaration under Section 564(b)(1) of the Act, 21 U.S.C. section 360bbb-3(b)(1), unless the authorization is terminated or revoked.  Performed at [facility], [address], [place], [place] [address]   Culture, Respiratory w Gram Stain     Status: None (Preliminary result)   Collection Time: 03/22/22  2:01 PM   Specimen: Bronchial Alveolar Lavage; Respiratory  Result Value Ref Range Status   Specimen Description   Final    BRONCHIAL ALVEOLAR LAVAGE Performed at [facility], [address], [place], [place] [address]    Special Requests   Final    LEFT UPPER LUNG Performed at [facility], [address] [place], [place] [address]    Gram Stain   Final    FEW WBC PRESENT, PREDOMINANTLY PMN RARE GRAM POSITIVE COCCI    Culture   Final    CULTURE REINCUBATED FOR BETTER GROWTH Performed at [facility], [place] [address], [place], [place] [address]  Report Status PENDING  Incomplete    Radiology Reports No results found.  SIGNED: Deatra James, MD, FHM. [facility],  Pager (please use [email or web address] to page/text) Please use Epic Secure Chat for non-urgent communication (7AM-7PM)  If 7PM-7AM, please contact night-coverage [email or web address], 03/24/2022, 12:19 PM

## 2022-03-24 NOTE — [place] FL2 (Signed)
[place] MEDICAID FL2 LEVEL OF CARE SCREENING TOOL     IDENTIFICATION  Patient Name: Tony Sullivan Birthdate: Mar 25, 1961 Sex: male Admission Date (Current Location): 03/19/2022  [facility] and [place] Number:  [employer] and Address:  [facility] [address] [place]      Provider Number: [id number]  Attending Physician Name and Address:  Deatra James, MD  Relative Name and Phone Number:       Current Level of Care: Hospital Recommended Level of Care: [place] Prior Approval Number:    Date Approved/Denied:   PASRR Number:    Discharge Plan: SNF    Current Diagnoses: Patient Active Problem List   Diagnosis Date Noted   Mass of upper lobe of left lung    Thoracic compression fracture ([place]) 03/20/2022   Hemoptysis 03/20/2022   Pneumonia 03/13/2022   COPD exacerbation ([place]) 02/27/2022   Pedal edema 02/19/2022   CAP (community acquired pneumonia) 01/07/2022   Prolonged QT interval 01/07/2022   Glaucoma 01/07/2022   Elevated troponin 10/27/2021   Hyperglycemia 10/27/2021   Elevated MCV 10/27/2021   Respiratory failure with hypoxia and hypercapnia ([place]) 10/27/2021   Acute exacerbation of chronic obstructive pulmonary disease (COPD) ([place]) 10/02/2021   Hypophosphatemia 10/02/2021   Hypoalbuminemia due to protein-calorie malnutrition ([place]) 10/02/2021   Physical deconditioning 07/26/2021   Numbness and tingling in left arm 05/06/2021   Leukocytosis 05/05/2021   Severe headache 05/05/2021   Mixed hyperlipidemia 05/24/2020   Hypokalemia 05/24/2020   Chest pain 05/23/2020   Bipolar disorder ([place]) 05/23/2020   HNP (herniated nucleus pulposus), cervical 09/06/2019   Abdominal pain    Poor dentition 11/04/2017   Acute on chronic respiratory failure with hypoxia ([place]) 08/29/2017   ETOH abuse 03/30/2017   Cigarette smoker 03/30/2017   Acute respiratory failure with hypoxia and hypercapnia (HCC) 03/29/2017    Special screening for malignant neoplasms, colon    Benign neoplasm of ascending colon    Benign neoplasm of descending colon    Hoarseness 08/22/2016   GERD (gastroesophageal reflux disease) 08/22/2016   Atypical chest pain 12/05/2015   Essential hypertension 06/15/2014   Pectoralis muscle strain 01/11/2014   COPD with acute exacerbation ([place]) 01/10/2014   COPD (chronic obstructive pulmonary disease) ([place]) 01/08/2014   Tobacco abuse 01/08/2014   Cough 01/08/2014   Chronic respiratory failure with hypoxia ([place]) 01/08/2014    Orientation RESPIRATION BLADDER Height & Weight     Self, Situation, Time, Place  O2 (4L) Continent Weight: 145 lb (65.8 kg) Height:  5\' 11"  (180.3 cm)  BEHAVIORAL SYMPTOMS/MOOD NEUROLOGICAL BOWEL NUTRITION STATUS      Continent Diet (Heart healthy)  AMBULATORY STATUS COMMUNICATION OF NEEDS Skin     Verbally Normal                       Personal Care Assistance Level of Assistance  Bathing, Feeding, Dressing Bathing Assistance: Limited assistance Feeding assistance: Independent Dressing Assistance: Limited assistance     Functional Limitations Info  Sight, Hearing, Speech Sight Info: Impaired Hearing Info: Adequate Speech Info: Adequate    SPECIAL CARE FACTORS FREQUENCY  PT (By licensed PT), OT (By licensed OT)     PT Frequency: 5 times weekly OT Frequency: 5 times weekly            Contractures Contractures Info: Not present    Additional Factors Info  Code Status, Allergies Code Status Info: FULL Allergies Info: Penicillins and Levocetirizine  Current Medications (03/24/2022):  This is the current hospital active medication list Current Facility-Administered Medications  Medication Dose Route Frequency Provider Last Rate Last Admin   acetaminophen (TYLENOL) tablet 650 mg  650 mg Oral Q6H PRN Adefeso, Oladapo, DO   650 mg at 03/23/22 1727   Or   acetaminophen (TYLENOL) suppository 650 mg  650 mg Rectal Q6H PRN  Adefeso, Oladapo, DO       ALPRAZolam Duanne Moron) tablet 0.5 mg  0.5 mg Oral TID PRN Skipper Cliche A, MD   0.5 mg at 03/23/22 1756   apixaban (ELIQUIS) tablet 5 mg  5 mg Oral BID Shahmehdi, Seyed A, MD   5 mg at 03/24/22 0801   atorvastatin (LIPITOR) tablet 40 mg  40 mg Oral Daily Adefeso, Oladapo, DO   40 mg at 03/24/22 0801   benztropine (COGENTIN) tablet 0.5 mg  0.5 mg Oral QHS Shahmehdi, Seyed A, MD       budesonide (PULMICORT) nebulizer solution 0.25 mg  0.25 mg Nebulization BID Shahmehdi, Seyed A, MD   0.25 mg at 03/24/22 0744   dorzolamide (TRUSOPT) 2 % ophthalmic solution 1 drop  1 drop Left Eye BID Shahmehdi, Seyed A, MD   1 drop at 03/24/22 0836   doxycycline (VIBRA-TABS) tablet 100 mg  100 mg Oral Q12H Adefeso, Oladapo, DO   100 mg at 03/24/22 0801   gabapentin (NEURONTIN) capsule 100 mg  100 mg Oral Q8H Shahmehdi, Seyed A, MD   100 mg at 03/24/22 0515   guaiFENesin (MUCINEX) 12 hr tablet 600 mg  600 mg Oral BID Shahmehdi, Seyed A, MD   600 mg at 03/24/22 0801   guaiFENesin-dextromethorphan (ROBITUSSIN DM) 100-10 MG/5ML syrup 10 mL  10 mL Oral Q4H PRN Shahmehdi, Seyed A, MD   10 mL at 03/23/22 1755   ipratropium-albuterol (DUONEB) 0.5-2.5 (3) MG/3ML nebulizer solution 3 mL  3 mL Nebulization Q6H Adefeso, Oladapo, DO   3 mL at 03/24/22 0737   ipratropium-albuterol (DUONEB) 0.5-2.5 (3) MG/3ML nebulizer solution 3 mL  3 mL Nebulization Q4H PRN Adefeso, Oladapo, DO       latanoprost (XALATAN) 0.005 % ophthalmic solution 1 drop  1 drop Both Eyes QHS Shahmehdi, Seyed A, MD   1 drop at 03/23/22 2108   losartan (COZAAR) tablet 100 mg  100 mg Oral Daily Adefeso, Oladapo, DO   100 mg at 03/24/22 0801   loxapine (LOXITANE) capsule 10 mg  10 mg Oral QHS Shahmehdi, Seyed A, MD       MEDLINE mouth rinse  15 mL Mouth Rinse BID Shahmehdi, Seyed A, MD   15 mL at 03/23/22 0816   mometasone-formoterol (DULERA) 200-5 MCG/ACT inhaler 2 puff  2 puff Inhalation BID Adefeso, Oladapo, DO   2 puff at 03/24/22 0751    pantoprazole (PROTONIX) EC tablet 40 mg  40 mg Oral Daily Adefeso, Oladapo, DO   40 mg at 03/24/22 0801   polyethylene glycol (MIRALAX / GLYCOLAX) packet 17 g  17 g Oral BID Skipper Cliche A, MD       [START ON 03/25/2022] predniSONE (DELTASONE) tablet 40 mg  40 mg Oral Q breakfast Shahmehdi, Seyed A, MD       simethicone (MYLICON) chewable tablet 80 mg  80 mg Oral QID PRN Mansy, Jan A, MD   80 mg at 03/23/22 1732   zolpidem (AMBIEN) tablet 5 mg  5 mg Oral QHS PRN Deatra James, MD         Discharge Medications: Please see discharge  summary for a list of discharge medications.  Relevant Imaging Results:  Relevant Lab Results:   Additional Information SSN: [id number]  Iona Beard, [place]

## 2022-03-24 NOTE — TOC Initial Note (Signed)
Transition of Care [facility]) - Initial/Assessment Note    Patient Details  Name: Tony Sullivan MRN: [id number] Date of Birth: 11-08-1960  Transition of Care [facility]) CM/SW Contact:    Annison Birchard C Tarpley-Carter, [place] Phone Number: 03/24/2022, 7:42 AM  Clinical Narrative:                 TOC CSW consulted with pt in regards to SNF placement.  Pt gave permission to send information out in search of a placement.  Stay in [place] if possible, but not opposed to somewhere else.  Alisea Matte Tarpley-Carter, MSW, LCSW-A Pronouns:  She/Her/Hers [facility] Clinical Social Worker Direct Number:  [phone] [email or web address]     Barriers to Discharge: Continued Medical Work up   Patient Goals and CMS Choice        Expected Discharge Plan and Services                                                Prior Living Arrangements/Services                       Activities of Daily Living Home Assistive Devices/Equipment: Wheelchair, Shower chair with back ADL Screening (condition at time of admission) Patient's cognitive ability adequate to safely complete daily activities?: Yes Is the patient deaf or have difficulty hearing?: No Does the patient have difficulty seeing, even when wearing glasses/contacts?: Yes Does the patient have difficulty concentrating, remembering, or making decisions?: No Patient able to express need for assistance with ADLs?: Yes Does the patient have difficulty dressing or bathing?: Yes Independently performs ADLs?: No Communication: Independent Dressing (OT): Needs assistance Is this a change from baseline?: Pre-admission baseline Grooming: Needs assistance Is this a change from baseline?: Pre-admission baseline Feeding: Needs assistance Is this a change from baseline?: Pre-admission baseline Bathing: Needs assistance Is this a change from baseline?: Pre-admission baseline Toileting: Needs  assistance Is this a change from baseline?: Pre-admission baseline In/Out Bed: Needs assistance Is this a change from baseline?: Pre-admission baseline Walks in Home: Needs assistance Is this a change from baseline?: Pre-admission baseline Does the patient have difficulty walking or climbing stairs?: Yes Weakness of Legs: Both Weakness of Arms/Hands: None  Permission Sought/Granted                  Emotional Assessment              Admission diagnosis:  COPD exacerbation ([place]) [J44.1] Malignant neoplasm of left lung, unspecified part of lung ([place]) [C34.92] Patient Active Problem List   Diagnosis Date Noted   Mass of upper lobe of left lung    Thoracic compression fracture ([place]) 03/20/2022   Hemoptysis 03/20/2022   Pneumonia 03/13/2022   COPD exacerbation ([place]) 02/27/2022   Pedal edema 02/19/2022   CAP (community acquired pneumonia) 01/07/2022   Prolonged QT interval 01/07/2022   Glaucoma 01/07/2022   Elevated troponin 10/27/2021   Hyperglycemia 10/27/2021   Elevated MCV 10/27/2021   Respiratory failure with hypoxia and hypercapnia ([place]) 10/27/2021   Acute exacerbation of chronic obstructive pulmonary disease (COPD) ([place]) 10/02/2021   Hypophosphatemia 10/02/2021   Hypoalbuminemia due to protein-calorie malnutrition ([place]) 10/02/2021   Physical deconditioning 07/26/2021   Numbness and tingling in left arm 05/06/2021   Leukocytosis 05/05/2021   Severe headache 05/05/2021   Mixed hyperlipidemia 05/24/2020   Hypokalemia 05/24/2020  Chest pain 05/23/2020   Bipolar disorder ([place]) 05/23/2020   HNP (herniated nucleus pulposus), cervical 09/06/2019   Abdominal pain    Poor dentition 11/04/2017   Acute on chronic respiratory failure with hypoxia ([place]) 08/29/2017   ETOH abuse 03/30/2017   Cigarette smoker 03/30/2017   Acute respiratory failure with hypoxia and hypercapnia (HCC) 03/29/2017   Special screening for malignant neoplasms, colon    Benign neoplasm of  ascending colon    Benign neoplasm of descending colon    Hoarseness 08/22/2016   GERD (gastroesophageal reflux disease) 08/22/2016   Atypical chest pain 12/05/2015   Essential hypertension 06/15/2014   Pectoralis muscle strain 01/11/2014   COPD with acute exacerbation ([place]) 01/10/2014   COPD (chronic obstructive pulmonary disease) ([place]) 01/08/2014   Tobacco abuse 01/08/2014   Cough 01/08/2014   Chronic respiratory failure with hypoxia ([place]) 01/08/2014   PCP:  [name], FNP Pharmacy:   [facility], [address] AT [address]. [place] [address] Phone: [phone] Fax: [phone]     Social Determinants of Health (SDOH) Interventions    Readmission Risk Interventions    03/14/2022    3:54 PM 02/28/2022   10:40 AM 09/08/2019    3:22 PM  Readmission Risk Prevention Plan  Transportation Screening Complete Complete Complete  PCP or Specialist Appt within 3-5 Days   Complete  HRI or [place]   Complete  Social Work Consult for [place] Planning/Counseling   Complete  Palliative Care Screening   Not Applicable  Medication Review Press photographer) Complete Complete Complete  PCP or Specialist appointment within 3-5 days of discharge Not Complete    HRI or [place] Complete Complete   SW Recovery Care/Counseling Consult Complete Complete   Palliative Care Screening Not Applicable Not [place] Patient Refused Not Applicable

## 2022-03-24 NOTE — TOC Progression Note (Signed)
Transition of Care [facility]) - Progression Note    Patient Details  Name: Tony Sullivan MRN: [id number] Date of Birth: August 09, 1961  Transition of Care [facility]) CM/SW [place], [place] Phone Number: 03/24/2022, 12:33 PM  Clinical Narrative:    CSW spoke with pt about interest in SNF. CSW explained that he will need to sign his check over to the facility. Pt states that he is agreeable to this. CSW completed Fl2 and sent referral out to facilities. TOC to follow.     Barriers to Discharge: Continued Medical Work up  Expected Discharge Plan and Services                                                 Social Determinants of Health (SDOH) Interventions    Readmission Risk Interventions    03/14/2022    3:54 PM 02/28/2022   10:40 AM 09/08/2019    3:22 PM  Readmission Risk Prevention Plan  Transportation Screening Complete Complete Complete  PCP or Specialist Appt within 3-5 Days   Complete  HRI or [place]   Complete  Social Work Consult for [place] Planning/Counseling   Complete  Palliative Care Screening   Not Applicable  Medication Review Press photographer) Complete Complete Complete  PCP or Specialist appointment within 3-5 days of discharge Not Complete    HRI or [place] Complete Complete   SW Recovery Care/Counseling Consult Complete Complete   Palliative Care Screening Not Applicable Not [place] Patient Refused Not Applicable

## 2022-03-25 DIAGNOSIS — J441 Chronic obstructive pulmonary disease with (acute) exacerbation: Secondary | ICD-10-CM | POA: Diagnosis not present

## 2022-03-25 LAB — CULTURE, RESPIRATORY W GRAM STAIN: Culture: NORMAL

## 2022-03-25 LAB — AFP TUMOR MARKER: AFP, Serum, Tumor Marker: 4.1 ng/mL (ref 0.0–8.4)

## 2022-03-25 MED ORDER — ALBUTEROL SULFATE HFA 108 (90 BASE) MCG/ACT IN AERS: 2.0000 | INHALATION_SPRAY | RESPIRATORY_TRACT | Status: DC

## 2022-03-25 MED ORDER — ALPRAZOLAM 0.5 MG PO TABS: 0.5000 mg | ORAL_TABLET | Freq: Three times a day (TID) | ORAL | 0 refills | Status: AC | PRN

## 2022-03-25 MED ORDER — GABAPENTIN 100 MG PO CAPS: 100.0000 mg | ORAL_CAPSULE | Freq: Three times a day (TID) | ORAL | 1 refills | Status: DC

## 2022-03-25 MED ORDER — IPRATROPIUM-ALBUTEROL 0.5-2.5 (3) MG/3ML IN SOLN
3.0000 mL | Freq: Three times a day (TID) | RESPIRATORY_TRACT | Status: DC
  Administered 2022-03-25 (×2): 3 mL via RESPIRATORY_TRACT
  Filled 2022-03-25 (×2): qty 3

## 2022-03-25 MED ORDER — METHYLPREDNISOLONE 4 MG PO TBPK: ORAL_TABLET | ORAL | 0 refills | Status: DC

## 2022-03-25 MED ORDER — ZOLPIDEM TARTRATE 5 MG PO TABS: 5.0000 mg | ORAL_TABLET | Freq: Every evening | ORAL | 0 refills | Status: DC | PRN

## 2022-03-25 MED ORDER — DOXYCYCLINE HYCLATE 100 MG PO TABS
100.0000 mg | ORAL_TABLET | Freq: Two times a day (BID) | ORAL | 0 refills | Status: DC
Start: 2022-03-25 — End: 2022-03-29

## 2022-03-25 NOTE — Plan of Care (Signed)
  Problem: Acute Rehab OT Goals (only OT should resolve) Goal: Pt. Will Perform Grooming Flowsheets (Taken 03/25/2022 0934) Pt Will Perform Grooming:  with modified independence  sitting Goal: Pt. Will Perform Lower Body Dressing Flowsheets (Taken 03/25/2022 0934) Pt Will Perform Lower Body Dressing:  with min assist  sitting/lateral leans Goal: Pt. Will Transfer To Toilet Flowsheets (Taken 03/25/2022 [phone]) Pt Will Transfer to Toilet:  with modified independence  stand pivot transfer Goal: Pt. Will Perform Toileting-Clothing Manipulation Flowsheets (Taken 03/25/2022 0934) Pt Will Perform Toileting - Clothing Manipulation and hygiene:  with min assist  with min guard assist  sitting/lateral leans Goal: Pt/Caregiver Will Perform Home Exercise Program Flowsheets (Taken 03/25/2022 [phone]) Pt/caregiver will Perform Home Exercise Program:  Increased strength  Both right and left upper extremity  Independently  Faye Strohman OT, MOT

## 2022-03-25 NOTE — Progress Notes (Signed)
Physician Discharge Summary [facility]    Patient: Tony Sullivan                   Admit date: 03/19/2022   DOB: 1960/12/13             Discharge date:03/25/2022/10:10 AM JIR:[id number]                          PCP: [name], FNP  Disposition:   HOME    Recommendations for Outpatient Follow-up:   Follow up: Pulmonologist Dr. Elsworth Soho for lung mass biopsy Follow-up with PCP in 1 week Follow-up with outpatient palliative care team in 1-2 weeks Continue persistent Regarding smoking cessation  Discharge Condition: Stable   Code Status:   Code Status: Full Code  Diet recommendation: Cardiac diet   Discharge Diagnoses:    Principal Problem:   COPD with acute exacerbation ([place]) Active Problems:   Tobacco abuse   Essential hypertension   GERD (gastroesophageal reflux disease)   Cigarette smoker   Acute on chronic respiratory failure with hypoxia ([facility])   Mixed hyperlipidemia   Elevated MCV   Thoracic compression fracture ([facility])   Hemoptysis   Mass of upper lobe of left lung   History of Present Illness/ Hospital Course [name] Summary:     Tony Sullivan is a 61 y.o. male with medical history significant of chronic respiratory failure with hypoxia secondary to COPD on supplemental oxygen via [place] at 3 LPM, hyperlipidemia, hypertension, GERD, tobacco abuse who presents to the emergency department due to blood in sputum which started about 2 hours PTA.   Patient presented to ED 2 nights ago with complaints of several days of wheezing and shortness of breath, he also complained of chest tightness.  CT scan was done which showed a mass that was reported that he (the patient) was aware of and was following with a pulmonologist.  He was treated for a COPD exacerbation, symptoms improved and patient was discharged home, though, patient did not want to be discharged due to him still having some shortness of breath.  Patient states that after he got home, he noted blood when  he coughs and since is still had some shortness of breath, he decided to return to the ED for further evaluation and management. Patient is a chronic smoker who states that he has cut down a lot on smoking and was trying to quit.  He denies fever, chills, headache, nausea, vomiting, abdominal pain.   Patient was recently admitted from 5/20 to 05/19 due to acute on chronic hypoxemic respiratory failure due to pneumonia with possible aspiration and then later also noted to have PE, he was treated with Levaquin and started on Eliquis.  Oxygen requirement at admission (6 L) was weaned down to baseline oxygen requirement at 3 LPM   ED Course:  Pt was intermittently tachypneic, other vital signs are within normal range.  O2 sat was 98-100% on supplemental oxygen 4 LPM.  Work-up in the ED showed normal CBC except for leukocytosis and MCV of 100.7.  BMP was normal except for BUN of 21, magnesium 2.5, troponin x2 was flat at 15.   Influenza A, B, SARS coronavirus 2 was negative. Chest x-ray showed emphysema and bronchitis changes without acute airspace disease CT chest with contrast showed severe emphysema. Superimposed airway inflammation. Improved airway- impaction within the lower lobes bilaterally.   Stable endobronchial mass within the a left upper lobar pulmonary  bronchus with extension to multiple peripheral segmental bronchi. Correlation with endoscopy is recommended. Solumedrol, magnesium and potassium were given, breathing treatment with albuterol was provided and doxycycline was given as well.     Acute on chronic respiratory failure with hypoxia secondary to COPD exacerbation -Improved back to baseline, 4 L of oxygen, satting 7%   Continue duo nebs, Mucinex, Dulera, doxycycline. -Switching from IV Solu-Medrol switch to >> p.o. prednisone   -Pulmonology Dr. 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      Elevated MCV MCV 100.7 Folate levels and vitamin B12 levels will be checked   Prior Thoracic compression fracture Stable, this occurred in 2019   Essential hypertension Continue losartan   GERD Continue Protonix   Mixed hyperlipidemia Continue Lipitor   Pulmonary embolism T-Eliquis was temporarily on hold for bronchoscopy, status post bronchoscopy mild tinged blood sputum,  - Restarting Eliquis   Tobacco abuse Counseled on tobacco abuse cessation, patient states that he is working on cessation -Accepted NicoDerm   Severe acute on chronic debility -Scooter/wheelchair bound -PT OT, consulted for evaluation recommendation, encouraging patient to ambulate Skin Assessment: I have examined the patient's skin and I agree with the wound assessment as performed by wound care team As outlined belowe:   ----------------------------------------------------------------------------------------------------------------   Consults: Pulmonology   DVT prophylaxis:  SCDs Start: 03/20/22 0113 apixaban (ELIQUIS) tablet 5 mg    Code Status:   Code Status: Full Code   Family Communication: Discussed with patient, once with the brother at bedside The above findings and plan of care has been discussed with patient  in detail,  they expressed understanding and agreement of above. -Advance care planning has been discussed.  Outpatient palliative care consultation has been placed        Disposition: From home Per PT OT recommendation SNF--proved for SNF, patient does not want to lose his monthly check--- requested to be discharged home   Discharge Instructions:   Discharge Instructions     Activity as tolerated - No restrictions   Complete by: As directed    Call MD for:  difficulty breathing, headache or visual disturbances   Complete by: As directed    Call MD for:  persistant nausea and vomiting   Complete by: As directed    Call MD for:  severe uncontrolled pain   Complete by: As directed    Call MD for:  temperature >100.4   Complete by: As directed    Diet - low sodium heart healthy   Complete by: As directed    Discharge instructions   Complete by: As directed    Avoid smoking at all cost..... Follow-up with pulmonologist within 1 week for final results of the lung biopsy   Increase activity slowly   Complete by: As directed    No wound care   Complete by: As directed       Allergies as of 03/25/2022       Reactions   Penicillins Anaphylaxis  Has patient had a PCN reaction causing immediate rash, facial/tongue/throat swelling, SOB or lightheadedness with hypotension: Yes Has patient had a PCN reaction causing severe rash involving mucus membranes or skin necrosis: No Has patient had a PCN reaction that required hospitalization No Has patient had a PCN reaction occurring within the last 10 years: No If all of the above answers are "NO", then may proceed with Cephalosporin use.   Levocetirizine Other (See Comments)   Muscle cramps        Medication List     STOP taking these medications    cetirizine 10 MG tablet Commonly known as: ZYRTEC   hydrochlorothiazide 25 MG tablet Commonly known as: HYDRODIURIL   hydrOXYzine 25 MG tablet Commonly known as: ATARAX   predniSONE 20 MG tablet Commonly known as: DELTASONE   risperiDONE 2 MG tablet Commonly known as: RISPERDAL       TAKE these medications    acetaminophen 325 MG  tablet Commonly known as: TYLENOL Take 2 tablets (650 mg total) by mouth every 6 (six) hours as needed for mild pain (or Fever >/= 101).   albuterol (2.5 MG/3ML) 0.083% nebulizer solution Commonly known as: PROVENTIL Take 3 mLs (2.5 mg total) by nebulization every 4 (four) hours as needed for wheezing or shortness of breath.   albuterol 108 (90 Base) MCG/ACT inhaler Commonly known as: VENTOLIN HFA Inhale 2 puffs into the lungs every 6 (six) hours as needed for wheezing or shortness of breath.   ALPRAZolam 0.5 MG tablet Commonly known as: XANAX Take 1 tablet (0.5 mg total) by mouth 3 (three) times daily as needed for up to 5 days for anxiety.   apixaban 5 MG Tabs tablet Commonly known as: ELIQUIS Take 1 tablet (5 mg total) by mouth 2 (two) times daily. What changed: Another medication with the same name was removed. Continue taking this medication, and follow the directions you see here.   atorvastatin 40 MG tablet Commonly known as: LIPITOR TAKE ONE TABLET BY MOUTH DAILY   benztropine 0.5 MG tablet Commonly known as: COGENTIN Take 0.5 mg by mouth at bedtime.   Breztri Aerosphere 160-9-4.8 MCG/ACT Aero Generic drug: Budeson-Glycopyrrol-Formoterol Inhale 2 puffs into the lungs in the morning and at bedtime.   diclofenac Sodium 1 % Gel Commonly known as: VOLTAREN Apply 2 g topically 4 (four) times daily as needed (pain).   dorzolamide 2 % ophthalmic solution Commonly known as: TRUSOPT Place 1 drop into the left eye 2 (two) times daily.   doxycycline 100 MG tablet Commonly known as: VIBRA-TABS Take 1 tablet (100 mg total) by mouth every 12 (twelve) hours for 5 days.   gabapentin 100 MG capsule Commonly known as: NEURONTIN Take 1 capsule (100 mg total) by mouth 3 (three) times daily. What changed: when to take this   guaiFENesin 200 MG tablet Take 2 tablets (400 mg total) by mouth every 6 (six) hours as needed for cough or to loosen phlegm.   latanoprost 0.005 %  ophthalmic solution Commonly known as: XALATAN Place 1 drop into both eyes at bedtime.   losartan 100 MG tablet Commonly known as: COZAAR Take 100 mg by mouth daily.   loxapine 10 MG capsule Commonly known as: LOXITANE Take 10 mg by mouth at bedtime.   methylPREDNISolone 4 MG Tbpk tablet Commonly known as: MEDROL DOSEPAK Medrol Dosepak take as instructed   mirtazapine 30 MG tablet Commonly known as: REMERON Take 1 tablet (30 mg total) by mouth at bedtime.   multivitamin with  minerals Tabs tablet Take 1 tablet by mouth daily.   OXYGEN Inhale 3-4 L into the lungs as directed. 3-4 liters with sleep and exertion as needed for low oxygen   pantoprazole 40 MG tablet Commonly known as: PROTONIX Take 1 tablet (40 mg total) by mouth daily.   polyethylene glycol 17 g packet Commonly known as: MIRALAX / GLYCOLAX TAKE 17 GRAMS BY MOUTH DAILY What changed: See the new instructions.   senna-docusate 8.6-50 MG tablet Commonly known as: Senokot-S Take 2 tablets by mouth at bedtime. What changed:  when to take this reasons to take this   simethicone 80 MG chewable tablet Commonly known as: MYLICON Chew 1 tablet (80 mg total) by mouth 4 (four) times daily as needed for flatulence.   zolpidem 5 MG tablet Commonly known as: AMBIEN Take 1 tablet (5 mg total) by mouth at bedtime as needed for up to 20 days for sleep.         Allergies  Allergen Reactions   Penicillins Anaphylaxis    Has patient had a PCN reaction causing immediate rash, facial/tongue/throat swelling, SOB or lightheadedness with hypotension: Yes Has patient had a PCN reaction causing severe rash involving mucus membranes or skin necrosis: No Has patient had a PCN reaction that required hospitalization No Has patient had a PCN reaction occurring within the last 10 years: No If all of the above answers are "NO", then may proceed with Cephalosporin use.    Levocetirizine Other (See Comments)    Muscle cramps      Quit smoking:  It is highly recommended that all people especially deals with diabetes to quit smoking or stay away from smoking, avoid secondhand smoking.   Vaccines:  Also highly recommended update your vaccines requirement, including SARS-CoV-2 , yearly  flu vaccine and pneumonia vaccine at least every 5 years.      Exercise: If you are able: 30 -60 minutes a day, 4 days a week, or 150 minutes a week.  The longer the better.  Combine stretch, strength, and aerobic activities.  If you were told in the past that you have high risk for cardiovascular diseases, you may seek evaluation by your heart doctor prior to initiating moderate to intense exercise programs.    One other important lifestyle recommendation is to ensure adequate sleep - at least 6-7 hours of uninterrupted sleep at night.  Procedures /Studies:   DG Chest 1 View  Result Date: 03/18/2022 CLINICAL DATA:  Chest and abdominal pain. EXAM: CHEST  1 VIEW COMPARISON:  CT chest dated Mar 13, 2022 FINDINGS: The heart size and mediastinal contours are within normal limits. Emphysematous changes of the lungs. Focal consolidation or appreciable pleural effusion. Callus formation of the prior right posterior sixth rib fracture. IMPRESSION: 1. Emphysematous changes without evidence of acute cardiopulmonary process. 2.  Callus formation of the prior right sixth rib fracture. Electronically Signed   By: Keane Police D.O.   On: 03/18/2022 17:05   DG Chest 2 View  Result Date: 03/06/2022 CLINICAL DATA:  Chest pain. EXAM: CHEST - 2 VIEW COMPARISON:  Chest x-ray May 3, 23. FINDINGS: No consolidation. No visible pleural effusions or pneumothorax. Hyperinflation. Remote right rib fracture bony callus. IMPRESSION: Hyperinflation without acute abnormality. Electronically Signed   By: Margaretha Sheffield M.D.   On: 03/06/2022 14:09   CT Chest W Contrast  Result Date: 03/19/2022 CLINICAL DATA:  Respiratory illness, nondiagnostic xray. Chest pain.  EXAM: CT CHEST WITH CONTRAST TECHNIQUE: Multidetector CT imaging of the  chest was performed during intravenous contrast administration. RADIATION DOSE REDUCTION: This exam was performed according to the departmental dose-optimization program which includes automated exposure control, adjustment of the mA and/or kV according to patient size and/or use of iterative reconstruction technique. CONTRAST:  26mL OMNIPAQUE IOHEXOL 300 MG/ML  SOLN COMPARISON:  03/13/2022 FINDINGS: Cardiovascular: Moderate multi-vessel coronary artery calcification. Global cardiac size within normal limits. No pericardial effusion. Central pulmonary arteries are enlarged in keeping with changes of pulmonary arterial hypertension. Mild atherosclerotic calcification within the thoracic aorta. No aortic aneurysm. Mediastinum/Nodes: No enlarged mediastinal, hilar, or axillary lymph nodes. Thyroid gland, trachea, and esophagus demonstrate no significant findings. Lungs/Pleura: Severe emphysema. Superimposed airway inflammation again noted with bronchial wall thickening. Airway impaction within the lower lobes has improved in the interval since prior examination. Endobronchial mass is again identified within the left upper lobe are pulmonary bronchus extending peripherally into multiple segmental and subsegmental bronchi of the left upper lobe. No pneumothorax or pleural effusion. Upper Abdomen: No acute abnormality. Musculoskeletal: Stable mild compression deformities of T5, T8, T9-T10, T11, and L1. No acute bone abnormality. IMPRESSION: Severe emphysema. Superimposed airway inflammation. Improved airway impaction within the lower lobes bilaterally. 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 Correlation with endoscopy is recommended. Moderate multi-vessel coronary artery calcification. Morphologic changes in keeping with pulmonary arterial hypertension.  Numerous thoracic compression deformities, stable since prior examination. Aortic Atherosclerosis (ICD10-I70.0) and Emphysema (ICD10-J43.9). Electronically Signed   By: Fidela Salisbury M.D.   On: 03/19/2022 00:18   CT Angio Chest Pulmonary Embolism (PE) W or WO Contrast  Addendum Date: 03/13/2022   ADDENDUM REPORT: 03/13/2022 20:03 ADDENDUM: Upon further review, there is a small nonocclusive hypodense acute thrombus in the posterior right upper lobe segmental main artery on 4:41, partially occlusive thrombus in an adjacent apically directed first order segmental artery best seen on series 8 coronal reconstruction image 63 and 4: 38, with nonocclusive extension of thrombus a short distance into 2 of its subsegmental branch arteries on 4:37. This is a small embolic burden with no findings of associated acute right heart strain. Electronically Signed   By: Telford Nab M.D.   On: 03/13/2022 20:03   Result Date: 03/13/2022 CLINICAL DATA:  Chest tightness and shortness of breath. EXAM: CT ANGIOGRAPHY CHEST WITH CONTRAST TECHNIQUE: Multidetector CT imaging of the chest was performed using the standard protocol during bolus administration of intravenous contrast. Multiplanar CT image reconstructions and MIPs were obtained to evaluate the vascular anatomy. RADIATION DOSE REDUCTION: This exam was performed according to the departmental dose-optimization program which includes automated exposure control, adjustment of the mA and/or kV according to patient size and/or use of iterative reconstruction technique. CONTRAST:  157mL OMNIPAQUE IOHEXOL 350 MG/ML SOLN COMPARISON:  Portable chest 03/11/2022 portable chest today, CTA chest 02/14/2022, CTA chest 05/12/2018. FINDINGS: Cardiovascular: The cardiac size is normal. There is scattered calcification in the coronary arteries. There is no pericardial effusion. The great vessels are patent with normal variant brachiobicarotid trunk. The ascending aorta is borderline  aneurysmal measuring 3.9 cm. The remainder is within normal caliber limits. The pulmonary veins are decompressed. Prominent but unchanged pulmonary trunk again measuring 3.3 cm indicating arterial hypertension. The pulmonary arteries are free of thrombus at least through the segmental arteries with the subsegmental arterial bed for the most part obscured by breathing motion. Mediastinum/Nodes: No enlarged mediastinal, hilar, or axillary lymph nodes. Thyroid gland, trachea, and esophagus demonstrate no significant findings. Lungs/Pleura: Advanced  centrilobular/panlobular emphysema predominating in the upper lobes. Are scattered linear scar-like opacities. There is again noted airway obstruction/impaction involving the left upper lobe apicoposterior segmental main bronchus with mucous plugging or debris continuing cephalad into several subsegmental bronchi, as before. There is increased right lower lobe posterior basal subsegmental bronchial impaction on series 6 axial 97-107. Increased posterior basal segmental and subsegmental left lower lobe bronchial impactions are also noted with increased layering fluid in the left main bronchus. There is increased subpleural linear atelectasis in the posterior basal lower lobes compared to the last CT but no appreciable infiltrates. There is no pleural effusion, thickening or pneumothorax. Upper Abdomen: No acute findings. Musculoskeletal: Osteopenia. 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 There is a new moderate compression fracture of the T5 vertebral body with trace posterosuperior cortical retropulsion, 50% anterior and 40% posterior vertebral height loss, and a mild upper plate anterior wedge compression fracture of the T8 vertebral body without retropulsion, with 30% anterior and 25% posterior vertebral height loss. Again noted are there mild chronic compression fractures of T10, T11 and L1. Review of the MIP images confirms the above  findings. IMPRESSION: 1. Negative for visible pulmonary embolism with the subsegmental arterial bed largely not evaluated to breathing motion. 2. Segmental/subsegmental airway opacification left upper lobe which was seen previously. Obstructing soft tissue mass is not excluded. Bronchoscopy recommended. 3. Increased fluid/debris in the lower lobe posterior basal segmental/subsegmental bronchi. Consider aspiration precautions. There is increased fluid in the left main bronchus. 4. Increased atelectasis in the posterior lung bases but no visible infiltrates. Severe COPD change. 5. Compression fractures of the T5 and 8 vertebral bodies new from 02/14/2022 but acuity indeterminate. Osteopenia and chronic compression fractures. 6. Borderline aneurysmal ascending aorta. No aortic dissection or other significant ectasia. 7. Aortic and coronary artery atherosclerosis. Electronically Signed: By: Telford Nab M.D. On: 03/13/2022 04:57   US Venous Img Lower Bilateral (DVT)  Result Date: 03/14/2022 CLINICAL DATA:  Bilateral lower extremity pain for the past 5 years EXAM: BILATERAL LOWER EXTREMITY VENOUS DOPPLER ULTRASOUND TECHNIQUE: Gray-scale sonography with graded compression, as well as color Doppler and duplex ultrasound were performed to evaluate the lower extremity deep venous systems from the level of the common femoral vein and including the common femoral, femoral, profunda femoral, popliteal and calf veins including the posterior tibial, peroneal and gastrocnemius veins when visible. The superficial great saphenous vein was also interrogated. Spectral Doppler was utilized to evaluate flow at rest and with distal augmentation maneuvers in the common femoral, femoral and popliteal veins. COMPARISON:  None Available. FINDINGS: RIGHT LOWER EXTREMITY Common Femoral Vein: No evidence of thrombus. Normal compressibility, respiratory phasicity and response to augmentation. Saphenofemoral Junction: No evidence of  thrombus. Normal compressibility and flow on color Doppler imaging. Profunda Femoral Vein: No evidence of thrombus. Normal compressibility and flow on color Doppler imaging. Femoral Vein: No evidence of thrombus. Normal compressibility, respiratory phasicity and response to augmentation. Popliteal Vein: No evidence of thrombus. Normal compressibility, respiratory phasicity and response to augmentation. Calf Veins: No evidence of thrombus. Normal compressibility and flow on color Doppler imaging. Superficial Great Saphenous Vein: No evidence of thrombus. Normal compressibility. Venous Reflux:  None. Other Findings:  None. LEFT LOWER EXTREMITY Common Femoral Vein: No evidence of thrombus. Normal compressibility, respiratory phasicity and response to augmentation. Saphenofemoral Junction: No evidence of thrombus. Normal compressibility and flow on color Doppler imaging. Profunda Femoral Vein: No evidence of thrombus. Normal compressibility and flow on color Doppler imaging.  Femoral Vein: No evidence of thrombus. Normal compressibility, respiratory phasicity and response to augmentation. Popliteal Vein: No evidence of thrombus. Normal compressibility, respiratory phasicity and response to augmentation. Calf Veins: No evidence of thrombus. Normal compressibility and flow on color Doppler imaging. Superficial Great Saphenous Vein: No evidence of thrombus. Normal compressibility. Venous Reflux:  None. Other Findings:  None. IMPRESSION: No evidence of deep venous thrombosis in either lower extremity. Electronically Signed   By: Jacqulynn Cadet M.D.   On: 03/14/2022 14:18   DG CHEST PORT 1 VIEW  Result Date: 03/23/2022 CLINICAL DATA:  Shortness of breath starting this morning. EXAM: PORTABLE CHEST 1 VIEW COMPARISON:  03/21/2022 and chest CT dated 03/18/2022. FINDINGS: The previously demonstrated healing callus associated with a fracture of the right posterior 6th rib is unchanged. The previously demonstrated 2nd  nodular density more superiorly is no longer seen. Currently, the lungs are clear and remain hyperexpanded. Normal sized heart. Mildly tortuous and calcified thoracic aorta. IMPRESSION: 1. Resolved nodular density in the right upper lung zone. 2. Stable changes of COPD. Electronically Signed   By: Claudie Revering M.D.   On: 03/23/2022 10:00   DG CHEST PORT 1 VIEW  Result Date: 03/21/2022 CLINICAL DATA:  Short of breath and weakness EXAM: PORTABLE CHEST 1 VIEW COMPARISON:  03/19/2022 FINDINGS: COPD and emphysema. Negative for heart failure or edema. No infiltrate or effusion Nodular density in the right upper lobe corresponds to healing rib fracture as noted on CT. Second nodule superior to the rib callus is also noted and not seen on the prior chest x-ray. No nodule seen in the lungs on the prior CT of 03/18/2022. IMPRESSION: COPD and emphysema.  No acute infiltrate or effusion. Electronically Signed   By: Franchot Gallo M.D.   On: 03/21/2022 11:37   DG Chest Port 1 View  Result Date: 03/19/2022 CLINICAL DATA:  Hemoptysis EXAM: PORTABLE CHEST 1 VIEW COMPARISON:  03/18/2022, CT 03/18/2022 FINDINGS: Emphysema and bronchitic changes. No consolidation, pleural effusion, or pneumothorax. Prominent callus at the right sixth rib fracture accounts for right lower mid lung nodular opacity. Stable cardiomediastinal silhouette. IMPRESSION: Emphysema and bronchitic changes without acute airspace disease Electronically Signed   By: Donavan Foil M.D.   On: 03/19/2022 20:05   DG Chest Port 1 View  Result Date: 03/13/2022 CLINICAL DATA:  Chest pain, shortness of breath EXAM: PORTABLE CHEST 1 VIEW COMPARISON:  01/09/2022 FINDINGS: Mild patchy/nodular right upper lobe opacities, raising the possibility of mild pneumonia. Left lung is essentially clear. No pleural effusion or pneumothorax. The heart is normal in size. IMPRESSION: Mild patchy/nodular right upper lobe opacities, raising the possibility of mild pneumonia.  Electronically Signed   By: Julian Hy M.D.   On: 03/13/2022 03:19   DG Chest Portable 1 View  Result Date: 03/11/2022 CLINICAL DATA:  Shortness of breath EXAM: PORTABLE CHEST 1 VIEW COMPARISON:  Five days ago FINDINGS: Hyperinflation and architectural distortion from emphysema. Nodular density over the right chest is from rib callus by April 2023 CT. There is no edema, consolidation, effusion, or pneumothorax. Normal heart size and mediastinal contours. IMPRESSION: COPD without acute superimposed finding. Electronically Signed   By: Jorje Guild M.D.   On: 03/11/2022 05:22   DG Chest Port 1 View  Result Date: 02/27/2022 CLINICAL DATA:  Worsening shortness of breath.  History of COPD. EXAM: PORTABLE CHEST 1 VIEW COMPARISON:  02/20/2022 FINDINGS: 0827 hours. Lungs are hyperexpanded. Stable trace effusion versus scarring in the left costophrenic angle. Interstitial markings are  diffusely coarsened with chronic features. The cardiopericardial silhouette is within normal limits for size. Telemetry leads overlie the chest. IMPRESSION: 1. Hyperexpansion without acute cardiopulmonary findings. Electronically Signed   By: Misty Stanley M.D.   On: 02/27/2022 08:36   ECHOCARDIOGRAM COMPLETE  Result Date: 02/28/2022    ECHOCARDIOGRAM REPORT   Patient Name:   Tony Sullivan Bounds Date of Exam: 02/28/2022 Medical Rec #:  [id number]      Height:       71.0 in Accession #:    [id number]     Weight:       140.7 lb Date of Birth:  1961/06/03       BSA:          1.816 m Patient Age:    62 years       BP:           142/79 mmHg Patient Gender: M              HR:           75 bpm. Exam Location:  [facility] Procedure: 2D Echo, Cardiac Doppler, Color Doppler and Strain Analysis Indications:    Chest Pain  History:        Patient has prior history of Echocardiogram examinations, most                 recent 10/27/2021. Risk Factors:Hypertension, Current Smoker and                 Dyslipidemia. Elevated troponin, Bipolar.   Sonographer:    Leavy Cella RDCS Referring Phys: [phone] DAVID TAT IMPRESSIONS  1. Left ventricular ejection fraction, by estimation, is 60 to 65%. The left ventricle has normal function. The left ventricle has no regional wall motion abnormalities. Left ventricular diastolic parameters are consistent with Grade I diastolic dysfunction (impaired relaxation).  2. Right ventricular systolic function is normal. The right ventricular size is normal. Tricuspid regurgitation signal is inadequate for assessing PA pressure.  3. The mitral valve is normal in structure. No evidence of mitral valve regurgitation. No evidence of mitral stenosis.  4. The aortic valve was not well visualized. Aortic valve regurgitation is not visualized. No aortic stenosis is present.  5. The inferior vena cava is normal in size with greater than 50% respiratory variability, suggesting right atrial pressure of 3 mmHg. FINDINGS  Left Ventricle: Left ventricular ejection fraction, by estimation, is 60 to 65%. The left ventricle has normal function. The left ventricle has no regional wall motion abnormalities. Global longitudinal strain performed but not reported based on interpreter judgement due to suboptimal tracking. The left ventricular internal cavity size was normal in size. There is no left ventricular hypertrophy. Left ventricular diastolic parameters are consistent with Grade I diastolic dysfunction (impaired relaxation). Normal left ventricular filling pressure. Right Ventricle: The right ventricular size is normal. No increase in right ventricular wall thickness. Right ventricular systolic function is normal. Tricuspid regurgitation signal is inadequate for assessing PA pressure. Left Atrium: Left atrial size was normal in size. Right Atrium: Right atrial size was normal in size. Pericardium: There is no evidence of pericardial effusion. Mitral Valve: The mitral valve is normal in structure. No evidence of mitral valve regurgitation. No  evidence of mitral valve stenosis. Tricuspid Valve: The tricuspid valve is normal in structure. Tricuspid valve regurgitation is not demonstrated. No evidence of tricuspid stenosis. Aortic Valve: The aortic valve was not well visualized. Aortic valve regurgitation is not visualized. No aortic stenosis is present.  Aortic valve mean gradient measures 5.0 mmHg. Aortic valve peak gradient measures 10.2 mmHg. Aortic valve area, by VTI measures 1.85 cm. Pulmonic Valve: The pulmonic valve was not well visualized. Pulmonic valve regurgitation is not visualized. No evidence of pulmonic stenosis. Aorta: The aortic root is normal in size and structure. Venous: The inferior vena cava is normal in size with greater than 50% respiratory variability, suggesting right atrial pressure of 3 mmHg. IAS/Shunts: No atrial level shunt detected by color flow Doppler.  LEFT VENTRICLE PLAX 2D LVIDd:         4.90 cm   Diastology LVIDs:         3.00 cm   LV e' medial:    7.72 cm/s LV PW:         1.00 cm   LV E/e' medial:  11.8 LV IVS:        0.90 cm   LV e' lateral:   7.40 cm/s LVOT diam:     1.90 cm   LV E/e' lateral: 12.3 LV SV:         55 LV SV Index:   30 LVOT Area:     2.84 cm                           3D Volume EF:                          3D EF:        54 %                          LV EDV:       139 ml                          LV ESV:       64 ml                          LV SV:        75 ml RIGHT VENTRICLE RV Basal diam:  4.60 cm RV Mid diam:    3.30 cm RV S prime:     12.40 cm/s TAPSE (M-mode): 2.3 cm LEFT ATRIUM             Index        RIGHT ATRIUM          Index LA diam:        3.20 cm 1.76 cm/m   RA Area:     7.58 cm LA Vol (A2C):   38.7 ml 21.32 ml/m  RA Volume:   13.20 ml 7.27 ml/m LA Vol (A4C):   25.5 ml 14.05 ml/m LA Biplane Vol: 32.3 ml 17.79 ml/m  AORTIC VALVE AV Area (Vmax):    2.04 cm AV Area (Vmean):   1.95 cm AV Area (VTI):     1.85 cm AV Vmax:           160.00 cm/s AV Vmean:          106.000 cm/s AV VTI:             0.297 m AV Peak Grad:      10.2 mmHg AV Mean Grad:      5.0 mmHg LVOT Vmax:         115.00 cm/s LVOT Vmean:  72.800 cm/s LVOT VTI:          0.194 m LVOT/AV VTI ratio: 0.65  AORTA Ao Root diam: 2.90 cm Ao Asc diam:  2.90 cm MITRAL VALVE MV Area (PHT): 2.83 cm     SHUNTS MV Decel Time: 268 msec     Systemic VTI:  0.19 m MV E velocity: 91.18 cm/s   Systemic Diam: 1.90 cm MV A velocity: 121.00 cm/s MV E/A ratio:  0.75 Carlyle Dolly MD Electronically signed by Carlyle Dolly MD Signature Date/Time: 02/28/2022/4:29:59 PM    Final     Subjective:   Patient was seen and examined 03/25/2022, 10:10 AM Patient stable today. No acute distress.  No issues overnight Stable for discharge.  Discharge Exam:    Vitals:   03/24/22 2112 03/24/22 2119 03/25/22 0454 03/25/22 0748  BP: (!) 137/92 132/89 138/88   Pulse: 94  95   Resp:   20   Temp:   98.1 F (36.7 C)   TempSrc:   Oral   SpO2:   98% 97%  Weight:      Height:        General: Pt lying comfortably in bed & appears in no obvious distress. Cardiovascular: S1 & S2 heard, RRR, S1/S2 +. No murmurs, rubs, gallops or clicks. No JVD or pedal edema. Respiratory: O2 dependent, 4 L, chronic shortness of breath  Improved but still wheezing, No rhonchi or crackles. No increased work of breathing. Abdominal:  Non-distended, non-tender & soft. No organomegaly or masses appreciated. Normal bowel sounds heard. CNS: Alert and oriented. No focal deficits. Extremities: no edema, no cyanosis      The results of significant diagnostics from this hospitalization (including imaging, microbiology, ancillary and laboratory) are listed below for reference.      Microbiology:   Recent Results (from the past 240 hour(s))  Resp Panel by RT-PCR (Flu A&B, Covid) Nasopharyngeal Swab     Status: None   Collection Time: 03/19/22 11:30 PM   Specimen: Nasopharyngeal Swab; Nasopharyngeal(NP) swabs in vial transport medium  Result Value Ref Range Status    SARS Coronavirus 2 by RT PCR NEGATIVE NEGATIVE Final    Comment: (NOTE) SARS-CoV-2 target nucleic acids are NOT DETECTED.  The SARS-CoV-2 RNA is generally detectable in upper respiratory specimens during the acute phase of infection. The lowest concentration of SARS-CoV-2 viral copies this assay can detect is 138 copies/mL. A negative result does not preclude SARS-Cov-2 infection and should not be used as the sole basis for treatment or other patient management decisions. A negative result may occur with  improper specimen collection/handling, submission of specimen other than nasopharyngeal swab, presence of viral mutation(s) within the areas targeted by this assay, and inadequate number of viral copies(<138 copies/mL). A negative result must be combined with clinical observations, patient history, and epidemiological information. The expected result is Negative.  Fact Sheet for Patients:  EntrepreneurPulse.com.au  Fact Sheet for Healthcare Providers:  IncredibleEmployment.be  This test is no t yet approved or cleared by the [place] FDA and  has been authorized for detection and/or diagnosis of SARS-CoV-2 by FDA under an Emergency Use Authorization (EUA). This EUA will remain  in effect (meaning this test can be used) for the duration of the COVID-19 declaration under Section 564(b)(1) of the Act, 21 U.S.C.section 360bbb-3(b)(1), unless the authorization is terminated  or revoked sooner.       Influenza A by PCR NEGATIVE NEGATIVE Final   Influenza B by PCR NEGATIVE NEGATIVE Final  Comment: (NOTE) The Xpert Xpress SARS-CoV-2/FLU/RSV plus assay is intended as an aid in the diagnosis of influenza from Nasopharyngeal swab specimens and should not be used as a sole basis for treatment. Nasal washings and aspirates are unacceptable for Xpert Xpress SARS-CoV-2/FLU/RSV testing.  Fact Sheet for  Patients: EntrepreneurPulse.com.au  Fact Sheet for Healthcare Providers: IncredibleEmployment.be  This test is not yet approved or cleared by the [place] FDA and has been authorized for detection and/or diagnosis of SARS-CoV-2 by FDA under an Emergency Use Authorization (EUA). This EUA will remain in effect (meaning this test can be used) for the duration of the COVID-19 declaration under Section 564(b)(1) of the Act, 21 U.S.C. section 360bbb-3(b)(1), unless the authorization is terminated or revoked.  Performed at [facility], [address] [place], [place] [address]   Culture, Respiratory w Gram Stain     Status: None   Collection Time: 03/22/22  2:01 PM   Specimen: Bronchial Alveolar Lavage; Respiratory  Result Value Ref Range Status   Specimen Description   Final    BRONCHIAL ALVEOLAR LAVAGE Performed at [facility], [address], [place], [place] [address]    Special Requests   Final    LEFT UPPER LUNG Performed at [facility], [address] [place], [place] [address]    Gram Stain   Final    FEW WBC PRESENT, PREDOMINANTLY PMN RARE GRAM POSITIVE COCCI    Culture   Final    RARE Normal respiratory flora-no Staph aureus or Pseudomonas seen Performed at [facility], [address], [place], [place] [address]    Report Status 03/25/2022 FINAL  Final     Labs:   CBC: Recent Labs  Lab 03/18/22 1700 03/19/22 2006 03/20/22 0430  WBC 15.3* 13.2* 11.3*  NEUTROABS  --  11.4*  --   HGB 14.9 13.8 11.4*  HCT 47.4 43.9 35.9*  MCV 100.4* 100.7* 101.1*  PLT 211 198 301   Basic Metabolic Panel: Recent Labs  Lab 03/18/22 1700 03/19/22 2006 03/20/22 0430 03/23/22 0846  NA 142 140 141 141  K 4.0 3.5 3.5 3.8  CL 99 100 104 95*  CO2 33* 31 29 39*  GLUCOSE 105* 146* 235* 81  BUN 21* 27* 23* 19  CREATININE 1.04 1.17 0.92 0.73  CALCIUM 9.2 8.9 8.4* 8.5*  MG  --  2.5* 2.6*  --   PHOS  --   --  2.2*  --    Liver  Function Tests: Recent Labs  Lab 03/20/22 0430  AST 29  ALT 64*  ALKPHOS 89  BILITOT 0.1*  PROT 4.9*  ALBUMIN 2.5*   BNP (last 3 results) Recent Labs    02/19/22 1013 03/13/22 0312 03/23/22 0846  BNP 72.0 48.0 47.0    Urinalysis    Component Value Date/Time   COLORURINE YELLOW 10/16/2021 0130   APPEARANCEUR CLEAR 10/16/2021 0130   LABSPEC 1.020 10/16/2021 0130   PHURINE 6.0 10/16/2021 0130   GLUCOSEU NEGATIVE 10/16/2021 0130   HGBUR TRACE (A) 10/16/2021 0130   BILIRUBINUR NEGATIVE 10/16/2021 0130   KETONESUR NEGATIVE 10/16/2021 0130   PROTEINUR NEGATIVE 10/16/2021 0130   UROBILINOGEN 1.0 06/12/2015 1021   NITRITE NEGATIVE 10/16/2021 0130   LEUKOCYTESUR NEGATIVE 10/16/2021 0130         Time coordinating discharge: Over 45 minutes  SIGNED: Deatra James, MD, FACP, FHM. [facility],  Please use [email or web address] to Page If 7PM-7AM, please contact night-coverage [email or web address],  03/25/2022, 10:10 AM

## 2022-03-25 NOTE — Progress Notes (Signed)
Nsg Discharge Note  Admit Date:  03/19/2022 Discharge date: 03/25/2022   Cooper Render Pulcini to be D/C'd Home per MD order.  AVS completed.  Copy for chart, and copy for patient signed, and dated. Patient/caregiver able to verbalize understanding.  Discharge Medication: Allergies as of 03/25/2022       Reactions   Penicillins Anaphylaxis   Has patient had a PCN reaction causing immediate rash, facial/tongue/throat swelling, SOB or lightheadedness with hypotension: Yes Has patient had a PCN reaction causing severe rash involving mucus membranes or skin necrosis: No Has patient had a PCN reaction that required hospitalization No Has patient had a PCN reaction occurring within the last 10 years: No If all of the above answers are "NO", then may proceed with Cephalosporin use.   Levocetirizine Other (See Comments)   Muscle cramps        Medication List     STOP taking these medications    cetirizine 10 MG tablet Commonly known as: ZYRTEC   hydrochlorothiazide 25 MG tablet Commonly known as: HYDRODIURIL   hydrOXYzine 25 MG tablet Commonly known as: ATARAX   predniSONE 20 MG tablet Commonly known as: DELTASONE   risperiDONE 2 MG tablet Commonly known as: RISPERDAL       TAKE these medications    acetaminophen 325 MG tablet Commonly known as: TYLENOL Take 2 tablets (650 mg total) by mouth every 6 (six) hours as needed for mild pain (or Fever >/= 101).   albuterol (2.5 MG/3ML) 0.083% nebulizer solution Commonly known as: PROVENTIL Take 3 mLs (2.5 mg total) by nebulization every 4 (four) hours as needed for wheezing or shortness of breath.   albuterol 108 (90 Base) MCG/ACT inhaler Commonly known as: VENTOLIN HFA Inhale 2 puffs into the lungs every 6 (six) hours as needed for wheezing or shortness of breath.   ALPRAZolam 0.5 MG tablet Commonly known as: XANAX Take 1 tablet (0.5 mg total) by mouth 3 (three) times daily as needed for up to 5 days for anxiety.   apixaban 5  MG Tabs tablet Commonly known as: ELIQUIS Take 1 tablet (5 mg total) by mouth 2 (two) times daily. What changed: Another medication with the same name was removed. Continue taking this medication, and follow the directions you see here.   atorvastatin 40 MG tablet Commonly known as: LIPITOR TAKE ONE TABLET BY MOUTH DAILY   benztropine 0.5 MG tablet Commonly known as: COGENTIN Take 0.5 mg by mouth at bedtime.   Breztri Aerosphere 160-9-4.8 MCG/ACT Aero Generic drug: Budeson-Glycopyrrol-Formoterol Inhale 2 puffs into the lungs in the morning and at bedtime.   diclofenac Sodium 1 % Gel Commonly known as: VOLTAREN Apply 2 g topically 4 (four) times daily as needed (pain).   dorzolamide 2 % ophthalmic solution Commonly known as: TRUSOPT Place 1 drop into the left eye 2 (two) times daily.   doxycycline 100 MG tablet Commonly known as: VIBRA-TABS Take 1 tablet (100 mg total) by mouth every 12 (twelve) hours for 5 days.   gabapentin 100 MG capsule Commonly known as: NEURONTIN Take 1 capsule (100 mg total) by mouth 3 (three) times daily. What changed: when to take this   guaiFENesin 200 MG tablet Take 2 tablets (400 mg total) by mouth every 6 (six) hours as needed for cough or to loosen phlegm.   latanoprost 0.005 % ophthalmic solution Commonly known as: XALATAN Place 1 drop into both eyes at bedtime.   losartan 100 MG tablet Commonly known as: COZAAR Take 100 mg  by mouth daily.   loxapine 10 MG capsule Commonly known as: LOXITANE Take 10 mg by mouth at bedtime.   methylPREDNISolone 4 MG Tbpk tablet Commonly known as: MEDROL DOSEPAK Medrol Dosepak take as instructed   mirtazapine 30 MG tablet Commonly known as: REMERON Take 1 tablet (30 mg total) by mouth at bedtime.   multivitamin with minerals Tabs tablet Take 1 tablet by mouth daily.   OXYGEN Inhale 3-4 L into the lungs as directed. 3-4 liters with sleep and exertion as needed for low oxygen   pantoprazole 40  MG tablet Commonly known as: PROTONIX Take 1 tablet (40 mg total) by mouth daily.   polyethylene glycol 17 g packet Commonly known as: MIRALAX / GLYCOLAX TAKE 17 GRAMS BY MOUTH DAILY What changed: See the new instructions.   senna-docusate 8.6-50 MG tablet Commonly known as: Senokot-S Take 2 tablets by mouth at bedtime. What changed:  when to take this reasons to take this   simethicone 80 MG chewable tablet Commonly known as: MYLICON Chew 1 tablet (80 mg total) by mouth 4 (four) times daily as needed for flatulence.   zolpidem 5 MG tablet Commonly known as: AMBIEN Take 1 tablet (5 mg total) by mouth at bedtime as needed for up to 20 days for sleep.               Discharge Care Instructions  (From admission, onward)           Start     Ordered   03/25/22 0000  Discharge wound care:       Comments: Per nursing wound care instructions   03/25/22 1427            Discharge Assessment: Vitals:   03/25/22 0748 03/25/22 1425  BP:    Pulse:    Resp:    Temp:    SpO2: 97% 96%   Skin clean, dry and intact without evidence of skin break down, no evidence of skin tears noted. IV catheter discontinued intact. Site without signs and symptoms of complications - no redness or edema noted at insertion site, patient denies c/o pain - only slight tenderness at site.  Dressing with slight pressure applied.  D/c Instructions-Education: Discharge instructions given to patient/family with verbalized understanding. D/c education completed with patient/family including follow up instructions, medication list, d/c activities limitations if indicated, with other d/c instructions as indicated by MD - patient able to verbalize understanding, all questions fully answered. Patient instructed to return to ED, call 911, or call MD for any changes in condition.  Patient escorted via [place], and D/C home via private auto.  Dorcas Mcmurray, LPN 05/03/8674 4:49 PM

## 2022-03-25 NOTE — Progress Notes (Signed)
Physical Therapy Treatment Patient Details Name: Tony Sullivan MRN: [id number] DOB: 01-May-1961 Today's Date: 03/25/2022   History of Present Illness Tony Sullivan is a 61 y.o. male with medical history significant of chronic respiratory failure with hypoxia secondary to COPD on supplemental oxygen via [place] at 3 LPM, hyperlipidemia, hypertension, GERD, tobacco abuse who presents to the emergency department due to blood in sputum which started about 2 hours PTA.  Patient presented to the emergency department 2 nights ago with complaints of several days of wheezing and shortness of breath, he also complained of chest tightness.  CT scan was done which showed a mass that was reported that he (the patient) was aware of and was following with a pulmonologist.  He was treated for a COPD exacerbation, symptoms improved and patient was discharged home, though, patient did not want to be discharged due to him still having some shortness of breath.  Patient states that after he got home, he noted blood when he coughs and since is still had some shortness of breath, he decided to return to the ED for further evaluation and management.  Patient is a chronic smoker who states that he has cut down a lot on smoking and was trying to quit.  He denies fever, chills, headache, nausea, vomiting, abdominal pain.     Patient was recently admitted from 5/20 to 05/19 due to acute on chronic hypoxemic respiratory failure due to pneumonia with possible aspiration and then later also noted to have PE, he was treated with Levaquin and started on Eliquis.  Oxygen requirement at admission (6 L) was weaned down to baseline oxygen requirement at 3 LPM    PT Comments    Patient does not require assist today to transition to seated EOB. He demonstrates good/fair sitting balance and impaired sitting tolerance. Patient is limited by fatigue and returns to supine at end of session. Patient will benefit from continued skilled physical therapy  in hospital and recommended venue below to increase strength, balance, endurance for safe ADLs and gait.    Recommendations for follow up therapy are one component of a multi-disciplinary discharge planning process, led by the attending physician.  Recommendations may be updated based on patient status, additional functional criteria and insurance authorization.  Follow Up Recommendations  Skilled nursing-short term rehab (<3 hours/day)     Assistance Recommended at Discharge Frequent or constant Supervision/Assistance  Patient can return home with the following A lot of help with walking and/or transfers;A lot of help with bathing/dressing/bathroom;Help with stairs or ramp for entrance   Equipment Recommendations  None recommended by PT    Recommendations for Other Services       Precautions / Restrictions Precautions Precautions: Fall Restrictions Weight Bearing Restrictions: No     Mobility  Bed Mobility Overal bed mobility: Needs Assistance Bed Mobility: Supine to Sit, Sit to Supine     Supine to sit: Supervision, HOB elevated Sit to supine: Supervision        Transfers                        Ambulation/Gait                   Stairs             Wheelchair Mobility    Modified Rankin (Stroke Patients Only)       Balance Overall balance assessment: Needs assistance Sitting-balance support: Bilateral upper extremity supported, Feet supported Sitting balance-Leahy  Scale: Fair Sitting balance - Comments: fair to good seated at EOB                                    Cognition Arousal/Alertness: Awake/alert Behavior During Therapy: WFL for tasks assessed/performed Overall Cognitive Status: Within Functional Limits for tasks assessed                                          Exercises      General Comments        Pertinent Vitals/Pain Pain Assessment Pain Assessment: Faces Faces Pain Scale:  Hurts little more Pain Location: R abdominal pain Pain Intervention(s): Limited activity within patient's tolerance, Monitored during session, Repositioned    Home Living Family/patient expects to be discharged to:: Private residence Living Arrangements: Non-relatives/Friends Available Help at Discharge: Personal care attendant;Friend(s) Type of Home: House Home Access: Ramped entrance       Home Layout: Able to live on main level with bedroom/bathroom Home Equipment: Rolling Walker (2 wheels);Cane - single point;Wheelchair - power;BSC/3in1;Wheelchair - manual Additional Comments: aide available 3hr/day 7 days/week; has 2 roomates    Prior Function            PT Goals (current goals can now be found in the care plan section) Acute Rehab PT Goals Patient Stated Goal: return home PT Goal Formulation: With patient Time For Goal Achievement: 03/28/22 Potential to Achieve Goals: Fair Progress towards PT goals: Progressing toward goals    Frequency    Min 2X/week      PT Plan Current plan remains appropriate    Co-evaluation PT/OT/SLP Co-Evaluation/Treatment: Yes Reason for Co-Treatment: To address functional/ADL transfers PT goals addressed during session: Mobility/safety with mobility;Strengthening/ROM OT goals addressed during session: ADL's and self-care      AM-PAC PT "6 Clicks" Mobility   Outcome Measure  Help needed turning from your back to your side while in a flat bed without using bedrails?: None Help needed moving from lying on your back to sitting on the side of a flat bed without using bedrails?: A Little Help needed moving to and from a bed to a chair (including a wheelchair)?: A Lot Help needed standing up from a chair using your arms (e.g., wheelchair or bedside chair)?: A Lot Help needed to walk in hospital room?: Total Help needed climbing 3-5 steps with a railing? : Total 6 Click Score: 13    End of Session Equipment Utilized During Treatment:  Oxygen Activity Tolerance: Patient limited by fatigue Patient left: in bed;with call bell/phone within reach Nurse Communication: Mobility status PT Visit Diagnosis: Muscle weakness (generalized) (M62.81);Pain     Time: 5170-0174 PT Time Calculation (min) (ACUTE ONLY): 9 min  Charges:  $Therapeutic Activity: 8-22 mins                     10:03 AM, 03/25/22 Mearl Latin PT, DPT Physical Therapist at [facility]

## 2022-03-25 NOTE — Evaluation (Signed)
Occupational Therapy Evaluation Patient Details Name: Tony Sullivan MRN: [id number] DOB: 1960/12/30 Today's Date: 03/25/2022   History of Present Illness Tony Sullivan is a 61 y.o. male with medical history significant of chronic respiratory failure with hypoxia secondary to COPD on supplemental oxygen via [place] at 3 LPM, hyperlipidemia, hypertension, GERD, tobacco abuse who presents to the emergency department due to blood in sputum which started about 2 hours PTA.  Patient presented to the emergency department 2 nights ago with complaints of several days of wheezing and shortness of breath, he also complained of chest tightness.  CT scan was done which showed a mass that was reported that he (the patient) was aware of and was following with a pulmonologist.  He was treated for a COPD exacerbation, symptoms improved and patient was discharged home, though, patient did not want to be discharged due to him still having some shortness of breath.  Patient states that after he got home, he noted blood when he coughs and since is still had some shortness of breath, he decided to return to the ED for further evaluation and management.  Patient is a chronic smoker who states that he has cut down a lot on smoking and was trying to quit.  He denies fever, chills, headache, nausea, vomiting, abdominal pain.     Patient was recently admitted from 5/20 to 05/19 due to acute on chronic hypoxemic respiratory failure due to pneumonia with possible aspiration and then later also noted to have PE, he was treated with Levaquin and started on Eliquis.  Oxygen requirement at admission (6 L) was weaned down to baseline oxygen requirement at 3 LPM   Clinical Impression   Pt agreeable to sitting at bedside for OT evaluation and PT treatment. Pt limited by reports of SOB and pain when sitting up at EOB. Supervision assist to sit at EOB with head of bed raised. Pt demonstrates general B UE weakness with very poor endurance due to  wanting to return to supine after a few minute of sitting at EOB. Pt is assisted for ADL's at baseline. Pt donned 4 L supplemental O2 throughout session. Pt left in bed with call bell within reach. Pt will benefit from continued OT in the hospital and recommended venue below to increase strength, balance, and endurance for safe ADL's.         Recommendations for follow up therapy are one component of a multi-disciplinary discharge planning process, led by the attending physician.  Recommendations may be updated based on patient status, additional functional criteria and insurance authorization.   Follow Up Recommendations  Skilled nursing-short term rehab (<3 hours/day)    Assistance Recommended at Discharge Intermittent Supervision/Assistance  Patient can return home with the following A little help with walking and/or transfers;A lot of help with bathing/dressing/bathroom;Assistance with cooking/housework;Assist for transportation;Help with stairs or ramp for entrance    Functional Status Assessment  Patient has had a recent decline in their functional status and demonstrates the ability to make significant improvements in function in a reasonable and predictable amount of time.  Equipment Recommendations  None recommended by OT    Recommendations for Other Services       Precautions / Restrictions Precautions Precautions: Fall Restrictions Weight Bearing Restrictions: No      Mobility Bed Mobility Overal bed mobility: Needs Assistance Bed Mobility: Supine to Sit, Sit to Supine     Supine to sit: Supervision, HOB elevated Sit to supine: Supervision   General bed mobility comments: Head  of bed elevated    Transfers                          Balance Overall balance assessment: Needs assistance Sitting-balance support: Bilateral upper extremity supported, Feet supported Sitting balance-Leahy Scale: Fair Sitting balance - Comments: fair to good seated at EOB                                    ADL either performed or assessed with clinical judgement   ADL Overall ADL's : Needs assistance/impaired     Grooming: Set up;Min guard;Sitting       Lower Body Bathing: Moderate assistance;Sitting/lateral leans   Upper Body Dressing : Minimal assistance;Min guard;Sitting   Lower Body Dressing: Moderate assistance;Sitting/lateral leans     Toilet Transfer Details (indicate cue type and reason): pt would not attempt transfer today Toileting- Clothing Manipulation and Hygiene: Moderate assistance;Sitting/lateral lean         General ADL Comments: Pt limited by reports of difficulty breathing. Only demonstrating bedside mobility at this time.     Vision Baseline Vision/History: 1 Wears glasses Ability to See in Adequate Light: 1 Impaired Patient Visual Report: No change from baseline Vision Assessment?: No apparent visual deficits                Pertinent Vitals/Pain Pain Assessment Pain Assessment: Faces Faces Pain Scale: Hurts little more Pain Location: R abdominal pain Pain Descriptors / Indicators: Discomfort Pain Intervention(s): Limited activity within patient's tolerance, Monitored during session, Repositioned     Hand Dominance Right   Extremity/Trunk Assessment Upper Extremity Assessment Upper Extremity Assessment: Generalized weakness   Lower Extremity Assessment Lower Extremity Assessment: Defer to PT evaluation       Communication Communication Communication: No difficulties   Cognition Arousal/Alertness: Awake/alert Behavior During Therapy: WFL for tasks assessed/performed Overall Cognitive Status: Within Functional Limits for tasks assessed                                                        Home Living Family/patient expects to be discharged to:: Private residence Living Arrangements: Non-relatives/Friends Available Help at Discharge: Personal care  attendant;Friend(s) Type of Home: House Home Access: Ramped entrance     Home Layout: Able to live on main level with bedroom/bathroom     Bathroom Shower/Tub: Occupational psychologist: Standard Bathroom Accessibility: No   Home Equipment: Conservation officer, nature (2 wheels);Cane - single point;Wheelchair - power;BSC/3in1;Wheelchair - manual   Additional Comments: aide available 3hr/day 7 days/week; has 2 roomates      Prior Functioning/Environment Prior Level of Function : Needs assist       Physical Assist : Mobility (physical);ADLs (physical) Mobility (physical): Transfers ADLs (physical): Bathing;Dressing;Toileting;IADLs Mobility Comments: pt reports he typically performs transfers to/from power chair, does not ambulate ADLs Comments: Assited by aide for bathing, dressing, and toielting. Assited for IADL's        OT Problem List: Decreased strength;Decreased activity tolerance;Impaired balance (sitting and/or standing)      OT Treatment/Interventions: Self-care/ADL training;Therapeutic exercise;Therapeutic activities;Patient/family education;Balance training    OT Goals(Current goals can be found in the care plan section) Acute Rehab OT Goals Patient Stated Goal: I want home health. OT Goal  Formulation: With patient Time For Goal Achievement: 04/08/22 Potential to Achieve Goals: Fair  OT Frequency: Min 2X/week    Co-evaluation PT/OT/SLP Co-Evaluation/Treatment: Yes Reason for Co-Treatment: To address functional/ADL transfers   OT goals addressed during session: ADL's and self-care                       End of Session Equipment Utilized During Treatment: Oxygen  Activity Tolerance: Other (comment) (limited by complaint of SOB and subsequent pain.) Patient left: in bed;with call bell/phone within reach  OT Visit Diagnosis: Unsteadiness on feet (R26.81);Other abnormalities of gait and mobility (R26.89);Muscle weakness (generalized) (M62.81)                 Time: 4210-3128 OT Time Calculation (min): 9 min Charges:  OT General Charges $OT Visit: 1 Visit OT Evaluation $OT Eval Low Complexity: 1 Low  Ousman Dise OT, MOT  Larey Seat 03/25/2022, 9:32 AM

## 2022-03-25 NOTE — Discharge Summary (Addendum)
Physician Discharge Summary   Patient: Tony Sullivan MRN: [id number] DOB: 09/06/1961  Admit date:     03/19/2022  Discharge date: 03/25/22  Discharge Physician: Deatra James   PCP: [name], FNP   Recommendations at discharge:  Follow up: Pulmonologist Dr. Elsworth Soho for lung mass biopsy results  Follow-up with PCP in 1-2 week Follow-up with outpatient palliative care team in 1-2 weeks Continue smoking cessation   Discharge Diagnoses: Principal Problem:   COPD with acute exacerbation ([place]) Active Problems:   Tobacco abuse   Essential hypertension   GERD (gastroesophageal reflux disease)   Cigarette smoker   Acute on chronic respiratory failure with hypoxia ([facility])   Mixed hyperlipidemia   Elevated MCV   Thoracic compression fracture ([facility])   Hemoptysis   Mass of upper lobe of left lung  Resolved Problems:   * No resolved hospital problems. *  Hospital Course:  [name] is a 61 y.o. male with medical history significant of chronic respiratory failure with hypoxia secondary to COPD on supplemental oxygen via [place] at 3 LPM, hyperlipidemia, hypertension, GERD, tobacco abuse who presents to the emergency department due to blood in sputum which started about 2 hours PTA.   Patient presented to ED 2 nights ago with complaints of several days of wheezing and shortness of breath, he also complained of chest tightness.  CT scan was done which showed a mass that was reported that he (the patient) was aware of and was following with a pulmonologist.  He was treated for a COPD exacerbation, symptoms improved and patient was discharged home, though, patient did not want to be discharged due to him still having some shortness of breath.  Patient states that after he got home, he noted blood when he coughs and since is still had some shortness of breath, he decided to return to the ED for further evaluation and management. Patient is a chronic smoker who states that he has cut down a  lot on smoking and was trying to quit.  He denies fever, chills, headache, nausea, vomiting, abdominal pain.   Patient was recently admitted from 5/20 to 05/19 due to acute on chronic hypoxemic respiratory failure due to pneumonia with possible aspiration and then later also noted to have PE, he was treated with Levaquin and started on Eliquis.  Oxygen requirement at admission (6 L) was weaned down to baseline oxygen requirement at 3 LPM   ED Course:  Pt was intermittently tachypneic, other vital signs are within normal range.  O2 sat was 98-100% on supplemental oxygen 4 LPM.  Work-up in the ED showed normal CBC except for leukocytosis and MCV of 100.7.  BMP was normal except for BUN of 21, magnesium 2.5, troponin x2 was flat at 15.   Influenza A, B, SARS coronavirus 2 was negative. Chest x-ray showed emphysema and bronchitis changes without acute airspace disease CT chest with contrast showed severe emphysema. Superimposed airway inflammation. Improved airway- impaction within the lower lobes bilaterally.   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 Correlation with endoscopy is recommended. Solumedrol, magnesium and potassium were given, breathing treatment with albuterol was provided and doxycycline was given as well.     Acute on chronic respiratory failure with hypoxia secondary to COPD exacerbation -Improved back to baseline, 4 L of oxygen, satting 7%   Continue duo nebs, Mucinex, Dulera, doxycycline. -Switching from IV Solu-Medrol switch to >> p.o. prednisone   -  Pulmonology Dr. 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      Elevated MCV MCV 100.7 Folate levels and vitamin B12 levels will be checked   Prior Thoracic compression fracture Stable, this occurred in 2019   Essential hypertension Continue losartan   GERD Continue Protonix   Mixed hyperlipidemia Continue Lipitor   Pulmonary embolism Eliquis was temporarily on hold for bronchoscopy, status post bronchoscopy mild tinged blood sputum,  - Restarting Eliquis   Tobacco abuse Counseled on tobacco abuse cessation, patient states that he is working on cessation -Accepted NicoDerm   Severe acute on chronic debility -Scooter/wheelchair bound -PT OT, consulted for evaluation recommendation, encouraging patient to ambulate Skin Assessment: I have examined the patient's skin and I agree with the wound assessment as performed by wound care team    ----------------------------------------------------------------------------------------------------------------   Consults: Pulmonology      Code Status:   Code Status: Full Code   Family Communication: Discussed with patient, once with the brother at bedside The above findings and plan of care has been discussed with patient  in detail,  they expressed understanding and agreement of above. -Advance care planning has been discussed.  Outpatient palliative care consultation has been placed       Disposition: From home Per PT OT recommendation SNF--proved for SNF, patient does not want to lose his monthly check--- requested to be discharged home      Consultants: Pulmonologist, Education officer, museum, PT, OT, Procedures performed: Bronchoscopy Disposition: Home health Diet  recommendation:  Discharge Diet Orders (From admission, onward)     Start     Ordered   03/25/22 0000  Diet - low sodium heart healthy        03/25/22 1010           Cardiac diet DISCHARGE MEDICATION: Allergies as of 03/25/2022       Reactions   Penicillins Anaphylaxis   Has patient had a PCN reaction causing immediate rash, facial/tongue/throat swelling, SOB or lightheadedness with hypotension: Yes Has patient had a PCN reaction causing severe rash involving mucus membranes or skin necrosis: No Has patient had a PCN reaction that required hospitalization No Has patient had a PCN reaction occurring within the last 10 years: No If all of the above answers are "NO", then may proceed with Cephalosporin use.   Levocetirizine Other (See Comments)   Muscle cramps        Medication List     STOP taking these medications    cetirizine 10 MG tablet Commonly known as: ZYRTEC   hydrochlorothiazide 25 MG tablet Commonly known as: HYDRODIURIL   hydrOXYzine 25 MG tablet Commonly known as: ATARAX   predniSONE 20 MG tablet Commonly known as: DELTASONE   risperiDONE 2 MG tablet Commonly known as: RISPERDAL       TAKE these medications    acetaminophen 325 MG tablet Commonly known as: TYLENOL Take 2 tablets (650 mg total) by mouth every 6 (six) hours as needed for mild  pain (or Fever >/= 101).   albuterol (2.5 MG/3ML) 0.083% nebulizer solution Commonly known as: PROVENTIL Take 3 mLs (2.5 mg total) by nebulization every 4 (four) hours as needed for wheezing or shortness of breath.   albuterol 108 (90 Base) MCG/ACT inhaler Commonly known as: VENTOLIN HFA Inhale 2 puffs into the lungs every 6 (six) hours as needed for wheezing or shortness of breath.   ALPRAZolam 0.5 MG tablet Commonly known as: XANAX Take 1 tablet (0.5 mg total) by mouth 3 (three) times daily as needed for up to 5 days for anxiety.   apixaban 5 MG Tabs tablet Commonly known as: ELIQUIS Take 1 tablet  (5 mg total) by mouth 2 (two) times daily. What changed: Another medication with the same name was removed. Continue taking this medication, and follow the directions you see here.   atorvastatin 40 MG tablet Commonly known as: LIPITOR TAKE ONE TABLET BY MOUTH DAILY   benztropine 0.5 MG tablet Commonly known as: COGENTIN Take 0.5 mg by mouth at bedtime.   Breztri Aerosphere 160-9-4.8 MCG/ACT Aero Generic drug: Budeson-Glycopyrrol-Formoterol Inhale 2 puffs into the lungs in the morning and at bedtime.   diclofenac Sodium 1 % Gel Commonly known as: VOLTAREN Apply 2 g topically 4 (four) times daily as needed (pain).   dorzolamide 2 % ophthalmic solution Commonly known as: TRUSOPT Place 1 drop into the left eye 2 (two) times daily.   doxycycline 100 MG tablet Commonly known as: VIBRA-TABS Take 1 tablet (100 mg total) by mouth every 12 (twelve) hours for 5 days.   gabapentin 100 MG capsule Commonly known as: NEURONTIN Take 1 capsule (100 mg total) by mouth 3 (three) times daily. What changed: when to take this   guaiFENesin 200 MG tablet Take 2 tablets (400 mg total) by mouth every 6 (six) hours as needed for cough or to loosen phlegm.   latanoprost 0.005 % ophthalmic solution Commonly known as: XALATAN Place 1 drop into both eyes at bedtime.   losartan 100 MG tablet Commonly known as: COZAAR Take 100 mg by mouth daily.   loxapine 10 MG capsule Commonly known as: LOXITANE Take 10 mg by mouth at bedtime.   methylPREDNISolone 4 MG Tbpk tablet Commonly known as: MEDROL DOSEPAK Medrol Dosepak take as instructed   mirtazapine 30 MG tablet Commonly known as: REMERON Take 1 tablet (30 mg total) by mouth at bedtime.   multivitamin with minerals Tabs tablet Take 1 tablet by mouth daily.   OXYGEN Inhale 3-4 L into the lungs as directed. 3-4 liters with sleep and exertion as needed for low oxygen   pantoprazole 40 MG tablet Commonly known as: PROTONIX Take 1 tablet (40  mg total) by mouth daily.   polyethylene glycol 17 g packet Commonly known as: MIRALAX / GLYCOLAX TAKE 17 GRAMS BY MOUTH DAILY What changed: See the new instructions.   senna-docusate 8.6-50 MG tablet Commonly known as: Senokot-S Take 2 tablets by mouth at bedtime. What changed:  when to take this reasons to take this   simethicone 80 MG chewable tablet Commonly known as: MYLICON Chew 1 tablet (80 mg total) by mouth 4 (four) times daily as needed for flatulence.   zolpidem 5 MG tablet Commonly known as: AMBIEN Take 1 tablet (5 mg total) by mouth at bedtime as needed for up to 20 days for sleep.               Discharge Care Instructions  (From admission, onward)  Start     Ordered   03/25/22 0000  Discharge wound care:       Comments: Per nursing wound care instructions   03/25/22 1427            Discharge Exam: Filed Weights   03/20/22 0128  Weight: 65.8 kg      Physical Exam:   General:  AAO x 3,  cooperative, no distress;   HEENT:  Normocephalic, PERRL, otherwise with in Normal limits   Neuro:  CNII-XII intact. , normal motor and sensation, reflexes intact   Lungs:   Clear to auscultation BL, Respirations unlabored,  No wheezes / crackles  Cardio:    S1/S2, RRR, No murmure, No Rubs or Gallops   Abdomen:  Soft, non-tender, bowel sounds active all four quadrants, no guarding or peritoneal signs.  Muscular  skeletal:  Limited exam -global generalized weaknesses Specifically severe lower extremity weakness, wheelchair-bound, - in bed, able to move all 4 extremities in bed 2+ pulses,  symmetric, No pitting edema  Skin:  Dry, warm to touch, negative for any Rashes,  Wounds: Please see nursing documentation       Condition at discharge: good  The results of significant diagnostics from this hospitalization (including imaging, microbiology, ancillary and laboratory) are listed below for reference.   Imaging Studies: DG Chest 1  View  Result Date: 03/18/2022 CLINICAL DATA:  Chest and abdominal pain. EXAM: CHEST  1 VIEW COMPARISON:  CT chest dated Mar 13, 2022 FINDINGS: The heart size and mediastinal contours are within normal limits. Emphysematous changes of the lungs. Focal consolidation or appreciable pleural effusion. Callus formation of the prior right posterior sixth rib fracture. IMPRESSION: 1. Emphysematous changes without evidence of acute cardiopulmonary process. 2.  Callus formation of the prior right sixth rib fracture. Electronically Signed   By: Keane Police D.O.   On: 03/18/2022 17:05   DG Chest 2 View  Result Date: 03/06/2022 CLINICAL DATA:  Chest pain. EXAM: CHEST - 2 VIEW COMPARISON:  Chest x-ray May 3, 23. FINDINGS: No consolidation. No visible pleural effusions or pneumothorax. Hyperinflation. Remote right rib fracture bony callus. IMPRESSION: Hyperinflation without acute abnormality. Electronically Signed   By: Margaretha Sheffield M.D.   On: 03/06/2022 14:09   CT Chest W Contrast  Result Date: 03/19/2022 CLINICAL DATA:  Respiratory illness, nondiagnostic xray. Chest pain. EXAM: CT CHEST WITH CONTRAST TECHNIQUE: Multidetector CT imaging of the chest was performed during intravenous contrast administration. RADIATION DOSE REDUCTION: This exam was performed according to the departmental dose-optimization program which includes automated exposure control, adjustment of the mA and/or kV according to patient size and/or use of iterative reconstruction technique. CONTRAST:  17mL OMNIPAQUE IOHEXOL 300 MG/ML  SOLN COMPARISON:  03/13/2022 FINDINGS: Cardiovascular: Moderate multi-vessel coronary artery calcification. Global cardiac size within normal limits. No pericardial effusion. Central pulmonary arteries are enlarged in keeping with changes of pulmonary arterial hypertension. Mild atherosclerotic calcification within the thoracic aorta. No aortic aneurysm. Mediastinum/Nodes: No enlarged mediastinal, hilar, or axillary  lymph nodes. Thyroid gland, trachea, and esophagus demonstrate no significant findings. Lungs/Pleura: Severe emphysema. Superimposed airway inflammation again noted with bronchial wall thickening. Airway impaction within the lower lobes has improved in the interval since prior examination. Endobronchial mass is again identified within the left upper lobe are pulmonary bronchus extending peripherally into multiple segmental and subsegmental bronchi of the left upper lobe. No pneumothorax or pleural effusion. Upper Abdomen: No acute abnormality. Musculoskeletal: Stable mild compression deformities of T5, T8, T9-T10, T11, and L1. No  acute bone abnormality. IMPRESSION: Severe emphysema. Superimposed airway inflammation. Improved airway impaction within the lower lobes bilaterally. 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 Correlation with endoscopy is recommended. Moderate multi-vessel coronary artery calcification. Morphologic changes in keeping with pulmonary arterial hypertension. Numerous thoracic compression deformities, stable since prior examination. Aortic Atherosclerosis (ICD10-I70.0) and Emphysema (ICD10-J43.9). Electronically Signed   By: Fidela Salisbury M.D.   On: 03/19/2022 00:18   CT Angio Chest Pulmonary Embolism (PE) W or WO Contrast  Addendum Date: 03/13/2022   ADDENDUM REPORT: 03/13/2022 20:03 ADDENDUM: Upon further review, there is a small nonocclusive hypodense acute thrombus in the posterior right upper lobe segmental main artery on 4:41, partially occlusive thrombus in an adjacent apically directed first order segmental artery best seen on series 8 coronal reconstruction image 63 and 4: 38, with nonocclusive extension of thrombus a short distance into 2 of its subsegmental branch arteries on 4:37. This is a small embolic burden with no findings of associated acute right heart strain. Electronically Signed    By: Telford Nab M.D.   On: 03/13/2022 20:03   Result Date: 03/13/2022 CLINICAL DATA:  Chest tightness and shortness of breath. EXAM: CT ANGIOGRAPHY CHEST WITH CONTRAST TECHNIQUE: Multidetector CT imaging of the chest was performed using the standard protocol during bolus administration of intravenous contrast. Multiplanar CT image reconstructions and MIPs were obtained to evaluate the vascular anatomy. RADIATION DOSE REDUCTION: This exam was performed according to the departmental dose-optimization program which includes automated exposure control, adjustment of the mA and/or kV according to patient size and/or use of iterative reconstruction technique. CONTRAST:  189mL OMNIPAQUE IOHEXOL 350 MG/ML SOLN COMPARISON:  Portable chest 03/11/2022 portable chest today, CTA chest 02/14/2022, CTA chest 05/12/2018. FINDINGS: Cardiovascular: The cardiac size is normal. There is scattered calcification in the coronary arteries. There is no pericardial effusion. The great vessels are patent with normal variant brachiobicarotid trunk. The ascending aorta is borderline aneurysmal measuring 3.9 cm. The remainder is within normal caliber limits. The pulmonary veins are decompressed. Prominent but unchanged pulmonary trunk again measuring 3.3 cm indicating arterial hypertension. The pulmonary arteries are free of thrombus at least through the segmental arteries with the subsegmental arterial bed for the most part obscured by breathing motion. Mediastinum/Nodes: No enlarged mediastinal, hilar, or axillary lymph nodes. Thyroid gland, trachea, and esophagus demonstrate no significant findings. Lungs/Pleura: Advanced centrilobular/panlobular emphysema predominating in the upper lobes. Are scattered linear scar-like opacities. There is again noted airway obstruction/impaction involving the left upper lobe apicoposterior segmental main bronchus with mucous plugging or debris continuing cephalad into several subsegmental bronchi, as  before. There is increased right lower lobe posterior basal subsegmental bronchial impaction on series 6 axial 97-107. Increased posterior basal segmental and subsegmental left lower lobe bronchial impactions are also noted with increased layering fluid in the left main bronchus. There is increased subpleural linear atelectasis in the posterior basal lower lobes compared to the last CT but no appreciable infiltrates. There is no pleural effusion, thickening or pneumothorax. Upper Abdomen: No acute findings. Musculoskeletal: Osteopenia. 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 There is a new moderate compression fracture of the T5 vertebral body with trace posterosuperior cortical retropulsion, 50% anterior and 40% posterior vertebral height loss, and a mild upper plate anterior wedge compression fracture of the T8 vertebral body without retropulsion, with 30% anterior and 25% posterior vertebral height loss. Again noted are there mild chronic compression fractures  of T10, T11 and L1. Review of the MIP images confirms the above findings. IMPRESSION: 1. Negative for visible pulmonary embolism with the subsegmental arterial bed largely not evaluated to breathing motion. 2. Segmental/subsegmental airway opacification left upper lobe which was seen previously. Obstructing soft tissue mass is not excluded. Bronchoscopy recommended. 3. Increased fluid/debris in the lower lobe posterior basal segmental/subsegmental bronchi. Consider aspiration precautions. There is increased fluid in the left main bronchus. 4. Increased atelectasis in the posterior lung bases but no visible infiltrates. Severe COPD change. 5. Compression fractures of the T5 and 8 vertebral bodies new from 02/14/2022 but acuity indeterminate. Osteopenia and chronic compression fractures. 6. Borderline aneurysmal ascending aorta. No aortic dissection or other significant ectasia. 7. Aortic and coronary artery  atherosclerosis. Electronically Signed: By: Telford Nab M.D. On: 03/13/2022 04:57   US Venous Img Lower Bilateral (DVT)  Result Date: 03/14/2022 CLINICAL DATA:  Bilateral lower extremity pain for the past 5 years EXAM: BILATERAL LOWER EXTREMITY VENOUS DOPPLER ULTRASOUND TECHNIQUE: Gray-scale sonography with graded compression, as well as color Doppler and duplex ultrasound were performed to evaluate the lower extremity deep venous systems from the level of the common femoral vein and including the common femoral, femoral, profunda femoral, popliteal and calf veins including the posterior tibial, peroneal and gastrocnemius veins when visible. The superficial great saphenous vein was also interrogated. Spectral Doppler was utilized to evaluate flow at rest and with distal augmentation maneuvers in the common femoral, femoral and popliteal veins. COMPARISON:  None Available. FINDINGS: RIGHT LOWER EXTREMITY Common Femoral Vein: No evidence of thrombus. Normal compressibility, respiratory phasicity and response to augmentation. Saphenofemoral Junction: No evidence of thrombus. Normal compressibility and flow on color Doppler imaging. Profunda Femoral Vein: No evidence of thrombus. Normal compressibility and flow on color Doppler imaging. Femoral Vein: No evidence of thrombus. Normal compressibility, respiratory phasicity and response to augmentation. Popliteal Vein: No evidence of thrombus. Normal compressibility, respiratory phasicity and response to augmentation. Calf Veins: No evidence of thrombus. Normal compressibility and flow on color Doppler imaging. Superficial Great Saphenous Vein: No evidence of thrombus. Normal compressibility. Venous Reflux:  None. Other Findings:  None. LEFT LOWER EXTREMITY Common Femoral Vein: No evidence of thrombus. Normal compressibility, respiratory phasicity and response to augmentation. Saphenofemoral Junction: No evidence of thrombus. Normal compressibility and flow on color  Doppler imaging. Profunda Femoral Vein: No evidence of thrombus. Normal compressibility and flow on color Doppler imaging. Femoral Vein: No evidence of thrombus. Normal compressibility, respiratory phasicity and response to augmentation. Popliteal Vein: No evidence of thrombus. Normal compressibility, respiratory phasicity and response to augmentation. Calf Veins: No evidence of thrombus. Normal compressibility and flow on color Doppler imaging. Superficial Great Saphenous Vein: No evidence of thrombus. Normal compressibility. Venous Reflux:  None. Other Findings:  None. IMPRESSION: No evidence of deep venous thrombosis in either lower extremity. Electronically Signed   By: Jacqulynn Cadet M.D.   On: 03/14/2022 14:18   DG CHEST PORT 1 VIEW  Result Date: 03/23/2022 CLINICAL DATA:  Shortness of breath starting this morning. EXAM: PORTABLE CHEST 1 VIEW COMPARISON:  03/21/2022 and chest CT dated 03/18/2022. FINDINGS: The previously demonstrated healing callus associated with a fracture of the right posterior 6th rib is unchanged. The previously demonstrated 2nd nodular density more superiorly is no longer seen. Currently, the lungs are clear and remain hyperexpanded. Normal sized heart. Mildly tortuous and calcified thoracic aorta. IMPRESSION: 1. Resolved nodular density in the right upper lung zone. 2. Stable changes of COPD. Electronically Signed  By: Claudie Revering M.D.   On: 03/23/2022 10:00   DG CHEST PORT 1 VIEW  Result Date: 03/21/2022 CLINICAL DATA:  Short of breath and weakness EXAM: PORTABLE CHEST 1 VIEW COMPARISON:  03/19/2022 FINDINGS: COPD and emphysema. Negative for heart failure or edema. No infiltrate or effusion Nodular density in the right upper lobe corresponds to healing rib fracture as noted on CT. Second nodule superior to the rib callus is also noted and not seen on the prior chest x-ray. No nodule seen in the lungs on the prior CT of 03/18/2022. IMPRESSION: COPD and emphysema.  No acute  infiltrate or effusion. Electronically Signed   By: Franchot Gallo M.D.   On: 03/21/2022 11:37   DG Chest Port 1 View  Result Date: 03/19/2022 CLINICAL DATA:  Hemoptysis EXAM: PORTABLE CHEST 1 VIEW COMPARISON:  03/18/2022, CT 03/18/2022 FINDINGS: Emphysema and bronchitic changes. No consolidation, pleural effusion, or pneumothorax. Prominent callus at the right sixth rib fracture accounts for right lower mid lung nodular opacity. Stable cardiomediastinal silhouette. IMPRESSION: Emphysema and bronchitic changes without acute airspace disease Electronically Signed   By: Donavan Foil M.D.   On: 03/19/2022 20:05   DG Chest Port 1 View  Result Date: 03/13/2022 CLINICAL DATA:  Chest pain, shortness of breath EXAM: PORTABLE CHEST 1 VIEW COMPARISON:  01/09/2022 FINDINGS: Mild patchy/nodular right upper lobe opacities, raising the possibility of mild pneumonia. Left lung is essentially clear. No pleural effusion or pneumothorax. The heart is normal in size. IMPRESSION: Mild patchy/nodular right upper lobe opacities, raising the possibility of mild pneumonia. Electronically Signed   By: Julian Hy M.D.   On: 03/13/2022 03:19   DG Chest Portable 1 View  Result Date: 03/11/2022 CLINICAL DATA:  Shortness of breath EXAM: PORTABLE CHEST 1 VIEW COMPARISON:  Five days ago FINDINGS: Hyperinflation and architectural distortion from emphysema. Nodular density over the right chest is from rib callus by April 2023 CT. There is no edema, consolidation, effusion, or pneumothorax. Normal heart size and mediastinal contours. IMPRESSION: COPD without acute superimposed finding. Electronically Signed   By: Jorje Guild M.D.   On: 03/11/2022 05:22   DG Chest Port 1 View  Result Date: 02/27/2022 CLINICAL DATA:  Worsening shortness of breath.  History of COPD. EXAM: PORTABLE CHEST 1 VIEW COMPARISON:  02/20/2022 FINDINGS: 0827 hours. Lungs are hyperexpanded. Stable trace effusion versus scarring in the left costophrenic  angle. Interstitial markings are diffusely coarsened with chronic features. The cardiopericardial silhouette is within normal limits for size. Telemetry leads overlie the chest. IMPRESSION: 1. Hyperexpansion without acute cardiopulmonary findings. Electronically Signed   By: Misty Stanley M.D.   On: 02/27/2022 08:36   ECHOCARDIOGRAM COMPLETE  Result Date: 02/28/2022    ECHOCARDIOGRAM REPORT   Patient Name:   CARLYLE MCELRATH Schnoebelen Date of Exam: 02/28/2022 Medical Rec #:  [id number]      Height:       71.0 in Accession #:    [id number]     Weight:       140.7 lb Date of Birth:  January 25, 1961       BSA:          1.816 m Patient Age:    60 years       BP:           142/79 mmHg Patient Gender: M              HR:           75 bpm. Exam Location:  [facility] Procedure: 2D Echo, Cardiac Doppler, Color Doppler and Strain Analysis Indications:    Chest Pain  History:        Patient has prior history of Echocardiogram examinations, most                 recent 10/27/2021. Risk Factors:Hypertension, Current Smoker and                 Dyslipidemia. Elevated troponin, Bipolar.  Sonographer:    Leavy Cella RDCS Referring Phys: [phone] DAVID TAT IMPRESSIONS  1. Left ventricular ejection fraction, by estimation, is 60 to 65%. The left ventricle has normal function. The left ventricle has no regional wall motion abnormalities. Left ventricular diastolic parameters are consistent with Grade I diastolic dysfunction (impaired relaxation).  2. Right ventricular systolic function is normal. The right ventricular size is normal. Tricuspid regurgitation signal is inadequate for assessing PA pressure.  3. The mitral valve is normal in structure. No evidence of mitral valve regurgitation. No evidence of mitral stenosis.  4. The aortic valve was not well visualized. Aortic valve regurgitation is not visualized. No aortic stenosis is present.  5. The inferior vena cava is normal in size with greater than 50% respiratory variability, suggesting right  atrial pressure of 3 mmHg. FINDINGS  Left Ventricle: Left ventricular ejection fraction, by estimation, is 60 to 65%. The left ventricle has normal function. The left ventricle has no regional wall motion abnormalities. Global longitudinal strain performed but not reported based on interpreter judgement due to suboptimal tracking. The left ventricular internal cavity size was normal in size. There is no left ventricular hypertrophy. Left ventricular diastolic parameters are consistent with Grade I diastolic dysfunction (impaired relaxation). Normal left ventricular filling pressure. Right Ventricle: The right ventricular size is normal. No increase in right ventricular wall thickness. Right ventricular systolic function is normal. Tricuspid regurgitation signal is inadequate for assessing PA pressure. Left Atrium: Left atrial size was normal in size. Right Atrium: Right atrial size was normal in size. Pericardium: There is no evidence of pericardial effusion. Mitral Valve: The mitral valve is normal in structure. No evidence of mitral valve regurgitation. No evidence of mitral valve stenosis. Tricuspid Valve: The tricuspid valve is normal in structure. Tricuspid valve regurgitation is not demonstrated. No evidence of tricuspid stenosis. Aortic Valve: The aortic valve was not well visualized. Aortic valve regurgitation is not visualized. No aortic stenosis is present. Aortic valve mean gradient measures 5.0 mmHg. Aortic valve peak gradient measures 10.2 mmHg. Aortic valve area, by VTI measures 1.85 cm. Pulmonic Valve: The pulmonic valve was not well visualized. Pulmonic valve regurgitation is not visualized. No evidence of pulmonic stenosis. Aorta: The aortic root is normal in size and structure. Venous: The inferior vena cava is normal in size with greater than 50% respiratory variability, suggesting right atrial pressure of 3 mmHg. IAS/Shunts: No atrial level shunt detected by color flow Doppler.  LEFT VENTRICLE  PLAX 2D LVIDd:         4.90 cm   Diastology LVIDs:         3.00 cm   LV e' medial:    7.72 cm/s LV PW:         1.00 cm   LV E/e' medial:  11.8 LV IVS:        0.90 cm   LV e' lateral:   7.40 cm/s LVOT diam:     1.90 cm   LV E/e' lateral: 12.3 LV SV:  55 LV SV Index:   30 LVOT Area:     2.84 cm                           3D Volume EF:                          3D EF:        54 %                          LV EDV:       139 ml                          LV ESV:       64 ml                          LV SV:        75 ml RIGHT VENTRICLE RV Basal diam:  4.60 cm RV Mid diam:    3.30 cm RV S prime:     12.40 cm/s TAPSE (M-mode): 2.3 cm LEFT ATRIUM             Index        RIGHT ATRIUM          Index LA diam:        3.20 cm 1.76 cm/m   RA Area:     7.58 cm LA Vol (A2C):   38.7 ml 21.32 ml/m  RA Volume:   13.20 ml 7.27 ml/m LA Vol (A4C):   25.5 ml 14.05 ml/m LA Biplane Vol: 32.3 ml 17.79 ml/m  AORTIC VALVE AV Area (Vmax):    2.04 cm AV Area (Vmean):   1.95 cm AV Area (VTI):     1.85 cm AV Vmax:           160.00 cm/s AV Vmean:          106.000 cm/s AV VTI:            0.297 m AV Peak Grad:      10.2 mmHg AV Mean Grad:      5.0 mmHg LVOT Vmax:         115.00 cm/s LVOT Vmean:        72.800 cm/s LVOT VTI:          0.194 m LVOT/AV VTI ratio: 0.65  AORTA Ao Root diam: 2.90 cm Ao Asc diam:  2.90 cm MITRAL VALVE MV Area (PHT): 2.83 cm     SHUNTS MV Decel Time: 268 msec     Systemic VTI:  0.19 m MV E velocity: 91.18 cm/s   Systemic Diam: 1.90 cm MV A velocity: 121.00 cm/s MV E/A ratio:  0.75 Carlyle Dolly MD Electronically signed by Carlyle Dolly MD Signature Date/Time: 02/28/2022/4:29:59 PM    Final     Microbiology: Results for orders placed or performed during the hospital encounter of 03/19/22  Resp Panel by RT-PCR (Flu A&B, Covid) Nasopharyngeal Swab     Status: None   Collection Time: 03/19/22 11:30 PM   Specimen: Nasopharyngeal Swab; Nasopharyngeal(NP) swabs in vial transport medium  Result Value Ref Range  Status   SARS Coronavirus 2 by RT PCR NEGATIVE NEGATIVE Final    Comment: (NOTE) SARS-CoV-2 target nucleic acids are NOT DETECTED.  The SARS-CoV-2 RNA is generally detectable  in upper respiratory specimens during the acute phase of infection. The lowest concentration of SARS-CoV-2 viral copies this assay can detect is 138 copies/mL. A negative result does not preclude SARS-Cov-2 infection and should not be used as the sole basis for treatment or other patient management decisions. A negative result may occur with  improper specimen collection/handling, submission of specimen other than nasopharyngeal swab, presence of viral mutation(s) within the areas targeted by this assay, and inadequate number of viral copies(<138 copies/mL). A negative result must be combined with clinical observations, patient history, and epidemiological information. The expected result is Negative.  Fact Sheet for Patients:  EntrepreneurPulse.com.au  Fact Sheet for Healthcare Providers:  IncredibleEmployment.be  This test is no t yet approved or cleared by the [place] FDA and  has been authorized for detection and/or diagnosis of SARS-CoV-2 by FDA under an Emergency Use Authorization (EUA). This EUA will remain  in effect (meaning this test can be used) for the duration of the COVID-19 declaration under Section 564(b)(1) of the Act, 21 U.S.C.section 360bbb-3(b)(1), unless the authorization is terminated  or revoked sooner.       Influenza A by PCR NEGATIVE NEGATIVE Final   Influenza B by PCR NEGATIVE NEGATIVE Final    Comment: (NOTE) The Xpert Xpress SARS-CoV-2/FLU/RSV plus assay is intended as an aid in the diagnosis of influenza from Nasopharyngeal swab specimens and should not be used as a sole basis for treatment. Nasal washings and aspirates are unacceptable for Xpert Xpress SARS-CoV-2/FLU/RSV testing.  Fact Sheet for  Patients: EntrepreneurPulse.com.au  Fact Sheet for Healthcare Providers: IncredibleEmployment.be  This test is not yet approved or cleared by the [place] FDA and has been authorized for detection and/or diagnosis of SARS-CoV-2 by FDA under an Emergency Use Authorization (EUA). This EUA will remain in effect (meaning this test can be used) for the duration of the COVID-19 declaration under Section 564(b)(1) of the Act, 21 U.S.C. section 360bbb-3(b)(1), unless the authorization is terminated or revoked.  Performed at [facility], [address], [place], [place] [address]   Culture, Respiratory w Gram Stain     Status: None   Collection Time: 03/22/22  2:01 PM   Specimen: Bronchial Alveolar Lavage; Respiratory  Result Value Ref Range Status   Specimen Description   Final    BRONCHIAL ALVEOLAR LAVAGE Performed at [facility], [address], [place], [place] [address]    Special Requests   Final    LEFT UPPER LUNG Performed at [facility], [address], [place], [place] [address]    Gram Stain   Final    FEW WBC PRESENT, PREDOMINANTLY PMN RARE GRAM POSITIVE COCCI    Culture   Final    RARE Normal respiratory flora-no Staph aureus or Pseudomonas seen Performed at [facility], [address] [place],  [address]    Report Status 03/25/2022 FINAL  Final    Labs: CBC: Recent Labs  Lab 03/18/22 1700 03/19/22 2006 03/20/22 0430  WBC 15.3* 13.2* 11.3*  NEUTROABS  --  11.4*  --   HGB 14.9 13.8 11.4*  HCT 47.4 43.9 35.9*  MCV 100.4* 100.7* 101.1*  PLT 211 198 778   Basic Metabolic Panel: Recent Labs  Lab 03/18/22 1700 03/19/22 2006 03/20/22 0430 03/23/22 0846  NA 142 140 141 141  K 4.0 3.5 3.5 3.8  CL 99 100 104 95*  CO2 33* 31 29 39*  GLUCOSE 105* 146* 235* 81  BUN 21* 27* 23* 19  CREATININE  1.04 1.17 0.92 0.73  CALCIUM 9.2 8.9 8.4* 8.5*  MG  --  2.5* 2.6*  --   PHOS  --   --  2.2*  --    Liver  Function Tests: Recent Labs  Lab 03/20/22 0430  AST 29  ALT 64*  ALKPHOS 89  BILITOT 0.1*  PROT 4.9*  ALBUMIN 2.5*   CBG: No results for input(s): GLUCAP in the last 168 hours.  Discharge time spent: greater than 30 minutes.  Signed: Deatra James, MD [facility] 03/25/2022

## 2022-03-25 NOTE — TOC Progression Note (Signed)
Transition of Care [facility]) - Progression Note    Patient Details  Name: COOLIDGE GOSSARD MRN: [id number] Date of Birth: 16-Jul-1961  Transition of Care [facility]) CM/SW [place], [place] Phone Number: 03/25/2022, 12:40 PM  Clinical Narrative:    Pt updated CSW that he cannot go to SNF as he is unable to sign his check over. Pt inquired about HH. CSW explained that most companies are unable to accept his insurance. CSW reached out to multiple [facility] agencies, none are able to accept pt at this time. Pt has an aide in the home and has been set up with palliative services. TOC to follow.    Barriers to Discharge: Continued Medical Work up  Expected Discharge Plan and Services           Expected Discharge Date: 03/26/22                                     Social Determinants of Health (SDOH) Interventions    Readmission Risk Interventions    03/14/2022    3:54 PM 02/28/2022   10:40 AM 09/08/2019    3:22 PM  Readmission Risk Prevention Plan  Transportation Screening Complete Complete Complete  PCP or Specialist Appt within 3-5 Days   Complete  HRI or [place]   Complete  Social Work Consult for [place] Planning/Counseling   Complete  Palliative Care Screening   Not Applicable  Medication Review Press photographer) Complete Complete Complete  PCP or Specialist appointment within 3-5 days of discharge Not Complete    HRI or [place] Complete Complete   SW Recovery Care/Counseling Consult Complete Complete   Palliative Care Screening Not Applicable Not [place] Patient Refused Not Applicable

## 2022-03-27 ENCOUNTER — Ambulatory Visit: Payer: Medicaid Other | Admitting: Internal Medicine

## 2022-03-28 ENCOUNTER — Other Ambulatory Visit: Payer: Self-pay

## 2022-03-28 ENCOUNTER — Inpatient Hospital Stay (HOSPITAL_COMMUNITY)
Admission: EM | Admit: 2022-03-28 | Discharge: 2022-03-29 | DRG: 190 | Disposition: A | Discharge status: Home / Self Care | Payer: Medicaid Other | Attending: Internal Medicine | Admitting: Internal Medicine

## 2022-03-28 ENCOUNTER — Encounter (HOSPITAL_COMMUNITY): Payer: Self-pay | Admitting: *Deleted

## 2022-03-28 ENCOUNTER — Emergency Department (HOSPITAL_COMMUNITY): Payer: Medicaid Other

## 2022-03-28 DIAGNOSIS — R5381 Other malaise: Secondary | ICD-10-CM | POA: Diagnosis present

## 2022-03-28 DIAGNOSIS — J9622 Acute and chronic respiratory failure with hypercapnia: Secondary | ICD-10-CM | POA: Diagnosis present

## 2022-03-28 DIAGNOSIS — J9621 Acute and chronic respiratory failure with hypoxia: Secondary | ICD-10-CM | POA: Diagnosis present

## 2022-03-28 DIAGNOSIS — F17210 Nicotine dependence, cigarettes, uncomplicated: Secondary | ICD-10-CM | POA: Diagnosis present

## 2022-03-28 DIAGNOSIS — K219 Gastro-esophageal reflux disease without esophagitis: Secondary | ICD-10-CM | POA: Diagnosis present

## 2022-03-28 DIAGNOSIS — R918 Other nonspecific abnormal finding of lung field: Secondary | ICD-10-CM | POA: Diagnosis present

## 2022-03-28 DIAGNOSIS — J439 Emphysema, unspecified: Principal | ICD-10-CM | POA: Diagnosis present

## 2022-03-28 DIAGNOSIS — E782 Mixed hyperlipidemia: Secondary | ICD-10-CM | POA: Diagnosis present

## 2022-03-28 DIAGNOSIS — J9601 Acute respiratory failure with hypoxia: Secondary | ICD-10-CM | POA: Diagnosis not present

## 2022-03-28 DIAGNOSIS — J9611 Chronic respiratory failure with hypoxia: Secondary | ICD-10-CM | POA: Diagnosis present

## 2022-03-28 DIAGNOSIS — R7303 Prediabetes: Secondary | ICD-10-CM | POA: Diagnosis present

## 2022-03-28 DIAGNOSIS — I10 Essential (primary) hypertension: Secondary | ICD-10-CM | POA: Diagnosis present

## 2022-03-28 DIAGNOSIS — R0602 Shortness of breath: Secondary | ICD-10-CM | POA: Diagnosis present

## 2022-03-28 DIAGNOSIS — R0789 Other chest pain: Secondary | ICD-10-CM | POA: Diagnosis present

## 2022-03-28 DIAGNOSIS — Z9981 Dependence on supplemental oxygen: Secondary | ICD-10-CM

## 2022-03-28 DIAGNOSIS — Z825 Family history of asthma and other chronic lower respiratory diseases: Secondary | ICD-10-CM | POA: Diagnosis not present

## 2022-03-28 DIAGNOSIS — Z833 Family history of diabetes mellitus: Secondary | ICD-10-CM

## 2022-03-28 DIAGNOSIS — J441 Chronic obstructive pulmonary disease with (acute) exacerbation: Secondary | ICD-10-CM | POA: Diagnosis present

## 2022-03-28 DIAGNOSIS — Z808 Family history of malignant neoplasm of other organs or systems: Secondary | ICD-10-CM

## 2022-03-28 DIAGNOSIS — Z7951 Long term (current) use of inhaled steroids: Secondary | ICD-10-CM | POA: Diagnosis not present

## 2022-03-28 DIAGNOSIS — C3412 Malignant neoplasm of upper lobe, left bronchus or lung: Secondary | ICD-10-CM | POA: Diagnosis present

## 2022-03-28 DIAGNOSIS — S22000A Wedge compression fracture of unspecified thoracic vertebra, initial encounter for closed fracture: Secondary | ICD-10-CM | POA: Diagnosis present

## 2022-03-28 DIAGNOSIS — Z79899 Other long term (current) drug therapy: Secondary | ICD-10-CM | POA: Diagnosis not present

## 2022-03-28 DIAGNOSIS — Z7901 Long term (current) use of anticoagulants: Secondary | ICD-10-CM

## 2022-03-28 DIAGNOSIS — D72829 Elevated white blood cell count, unspecified: Secondary | ICD-10-CM | POA: Diagnosis present

## 2022-03-28 DIAGNOSIS — J9602 Acute respiratory failure with hypercapnia: Secondary | ICD-10-CM

## 2022-03-28 DIAGNOSIS — F319 Bipolar disorder, unspecified: Secondary | ICD-10-CM | POA: Diagnosis present

## 2022-03-28 LAB — BASIC METABOLIC PANEL
Anion gap: 9 (ref 5–15)
BUN: 19 mg/dL (ref 6–20)
CO2: 35 mmol/L — ABNORMAL HIGH (ref 22–32)
Calcium: 9.2 mg/dL (ref 8.9–10.3)
Chloride: 100 mmol/L (ref 98–111)
Creatinine, Ser: 0.84 mg/dL (ref 0.61–1.24)
GFR, Estimated: >60 mL/min (ref >60)
Glucose, Bld: 110 mg/dL — ABNORMAL HIGH (ref 70–99)
Potassium: 4.3 mmol/L (ref 3.5–5.1)
Sodium: 144 mmol/L (ref 135–145)

## 2022-03-28 LAB — CBC WITH DIFFERENTIAL/PLATELET
Abs Immature Granulocytes: 0.31 10*3/uL — ABNORMAL HIGH (ref 0.00–0.07)
Basophils Absolute: 0.1 10*3/uL (ref 0.0–0.1)
Basophils Relative: 1 %
Eosinophils Absolute: 0 10*3/uL (ref 0.0–0.5)
Eosinophils Relative: 0 %
HCT: 42.8 % (ref 39.0–52.0)
Hemoglobin: 13.1 g/dL (ref 13.0–17.0)
Immature Granulocytes: 2 %
Lymphocytes Relative: 3 %
Lymphs Abs: 0.4 10*3/uL — ABNORMAL LOW (ref 0.7–4.0)
MCH: 31.8 pg (ref 26.0–34.0)
MCHC: 30.6 g/dL (ref 30.0–36.0)
MCV: 103.9 fL — ABNORMAL HIGH (ref 80.0–100.0)
Monocytes Absolute: 0.7 10*3/uL (ref 0.1–1.0)
Monocytes Relative: 5 %
Neutro Abs: 11.7 10*3/uL — ABNORMAL HIGH (ref 1.7–7.7)
Neutrophils Relative %: 89 %
Platelets: 172 10*3/uL (ref 150–400)
RBC: 4.12 MIL/uL — ABNORMAL LOW (ref 4.22–5.81)
RDW: 15.7 % — ABNORMAL HIGH (ref 11.5–15.5)
WBC: 13.1 10*3/uL — ABNORMAL HIGH (ref 4.0–10.5)
nRBC: 0 % (ref 0.0–0.2)

## 2022-03-28 LAB — BLOOD GAS, VENOUS
Acid-Base Excess: 8.9 mmol/L — ABNORMAL HIGH (ref 0.0–2.0)
Acid-Base Excess: 11.8 mmol/L — ABNORMAL HIGH (ref 0.0–2.0)
Bicarbonate: 40.8 mmol/L — ABNORMAL HIGH (ref 20.0–28.0)
Bicarbonate: 41.1 mmol/L — ABNORMAL HIGH (ref 20.0–28.0)
Drawn by: 61882
FIO2: 40 %
O2 Saturation: 84 %
O2 Saturation: 46 %
Patient temperature: 36.8
Patient temperature: 36.5
pCO2, Ven: 101 mmHg (ref 44–60)
pCO2, Ven: 76 mmHg (ref 44–60)
pH, Ven: 7.21 — ABNORMAL LOW (ref 7.25–7.43)
pH, Ven: 7.34 (ref 7.25–7.43)
pO2, Ven: 57 mmHg — ABNORMAL HIGH (ref 32–45)
pO2, Ven: <31 mmHg — CL (ref 32–45)

## 2022-03-28 LAB — BRAIN NATRIURETIC PEPTIDE: B Natriuretic Peptide: 61 pg/mL (ref 0.0–100.0)

## 2022-03-28 LAB — TROPONIN I (HIGH SENSITIVITY)
Troponin I (High Sensitivity): 18 ng/L — ABNORMAL HIGH (ref <18)
Troponin I (High Sensitivity): 17 ng/L (ref <18)

## 2022-03-28 LAB — PROCALCITONIN: Procalcitonin: <0.1 ng/mL

## 2022-03-28 LAB — CYTOLOGY - NON PAP

## 2022-03-28 IMAGING — DX DG CHEST 1V PORT
1 series · 1 of 1 positions shown · non-contrast
Comparison: Most recent prior chest x-ray [DATE]; CT scan of
the chest [DATE]

CLINICAL DATA: SOB

EXAM:
PORTABLE CHEST 1 VIEW

[chest ap]
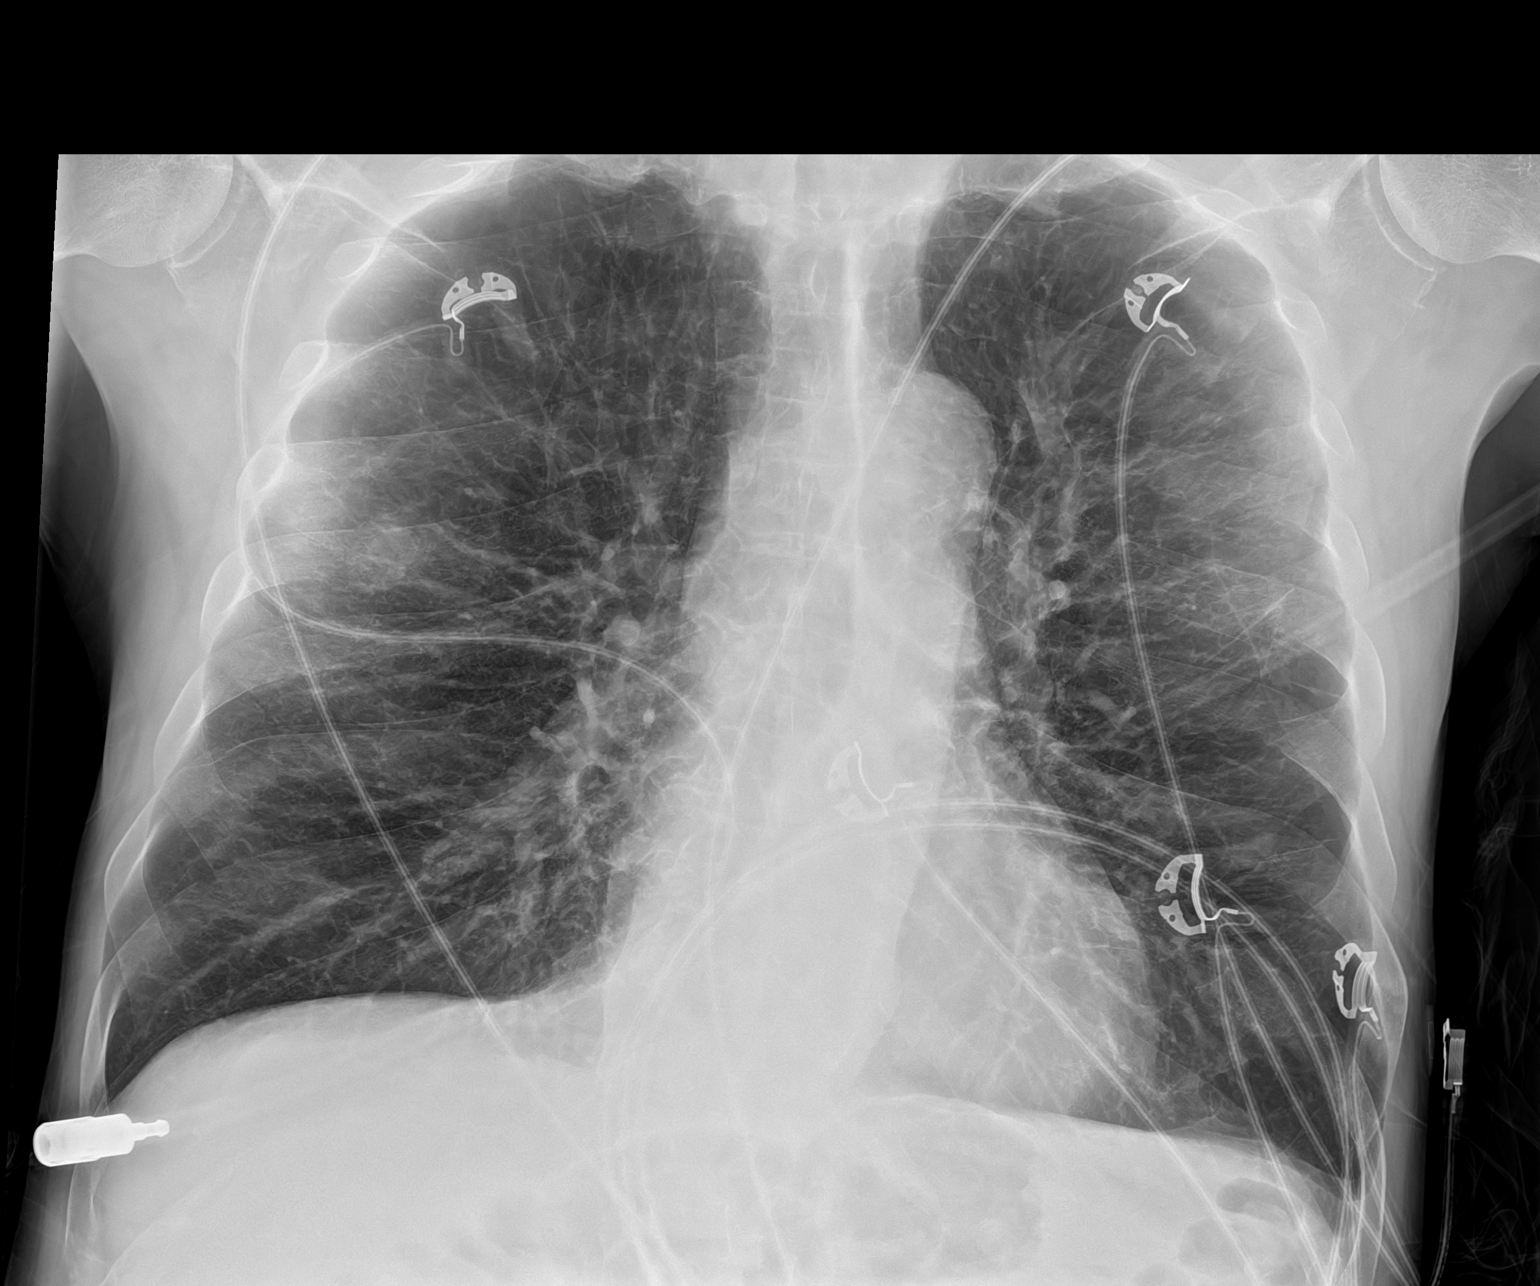

[1 of 1 positions shown; findings below may reference images not displayed]

FINDINGS: Cardiac and mediastinal contours are within normal limits. Mild
atherosclerotic calcifications in the transverse aorta. As before,
advanced emphysematous and chronic bronchitic changes present
bilaterally. No focal airspace infiltrate. Remote healed fracture of
the posterior aspect of the right sixth rib gives the pseudo
appearance of a pulmonary nodule. No pleural effusion or
pneumothorax.
IMPRESSION: Stable chest x-ray with hyperinflation and advanced emphysematous
and bronchitic changes suggesting COPD.

No acute cardiopulmonary process.

## 2022-03-28 MED ORDER — LORAZEPAM 2 MG/ML IJ SOLN
0.5000 mg | Freq: Once | INTRAMUSCULAR | Status: AC
Start: 2022-03-28 — End: 2022-03-28
  Administered 2022-03-28: 0.5 mg via INTRAVENOUS
  Filled 2022-03-28: qty 1

## 2022-03-28 MED ORDER — ONDANSETRON HCL 4 MG/2ML IJ SOLN: 4.0000 mg | Freq: Four times a day (QID) | INTRAMUSCULAR | Status: DC | PRN

## 2022-03-28 MED ORDER — PANTOPRAZOLE SODIUM 40 MG PO TBEC
40.0000 mg | DELAYED_RELEASE_TABLET | Freq: Every day | ORAL | Status: DC
  Administered 2022-03-29: 40 mg via ORAL
  Filled 2022-03-28: qty 1

## 2022-03-28 MED ORDER — SENNOSIDES-DOCUSATE SODIUM 8.6-50 MG PO TABS: 2.0000 | ORAL_TABLET | Freq: Every evening | ORAL | Status: DC | PRN

## 2022-03-28 MED ORDER — LATANOPROST 0.005 % OP SOLN
1.0000 [drp] | Freq: Every day | OPHTHALMIC | Status: DC
Start: 2022-03-28 — End: 2022-03-29
  Administered 2022-03-28: 1 [drp] via OPHTHALMIC
  Filled 2022-03-28: qty 2.5

## 2022-03-28 MED ORDER — ACETAMINOPHEN 325 MG PO TABS
650.0000 mg | ORAL_TABLET | Freq: Four times a day (QID) | ORAL | Status: DC | PRN
  Administered 2022-03-29: 650 mg via ORAL
  Filled 2022-03-28: qty 2

## 2022-03-28 MED ORDER — DORZOLAMIDE HCL 2 % OP SOLN
1.0000 [drp] | Freq: Two times a day (BID) | OPHTHALMIC | Status: DC
  Administered 2022-03-28 – 2022-03-29 (×2): 1 [drp] via OPHTHALMIC
  Filled 2022-03-28: qty 10

## 2022-03-28 MED ORDER — ALBUTEROL SULFATE (2.5 MG/3ML) 0.083% IN NEBU
INHALATION_SOLUTION | RESPIRATORY_TRACT | Status: AC
Start: 2022-03-28 — End: 2022-03-28
  Administered 2022-03-28: 10 mg
  Filled 2022-03-28: qty 9

## 2022-03-28 MED ORDER — ALBUTEROL (5 MG/ML) CONTINUOUS INHALATION SOLN
10.0000 mg/h | INHALATION_SOLUTION | Freq: Once | RESPIRATORY_TRACT | Status: DC
  Filled 2022-03-28: qty 20

## 2022-03-28 MED ORDER — SODIUM CHLORIDE 0.9% FLUSH: 3.0000 mL | INTRAVENOUS | Status: DC | PRN

## 2022-03-28 MED ORDER — SODIUM CHLORIDE 0.9% FLUSH
3.0000 mL | Freq: Two times a day (BID) | INTRAVENOUS | Status: DC
  Administered 2022-03-28 – 2022-03-29 (×3): 3 mL via INTRAVENOUS

## 2022-03-28 MED ORDER — SODIUM CHLORIDE 0.9 % IV SOLN: 250.0000 mL | INTRAVENOUS | Status: DC | PRN

## 2022-03-28 MED ORDER — METHYLPREDNISOLONE SODIUM SUCC 125 MG IJ SOLR
125.0000 mg | Freq: Once | INTRAMUSCULAR | Status: AC
  Administered 2022-03-28: 125 mg via INTRAVENOUS
  Filled 2022-03-28: qty 2

## 2022-03-28 MED ORDER — BENZTROPINE MESYLATE 1 MG PO TABS
0.5000 mg | ORAL_TABLET | Freq: Every day | ORAL | Status: DC
  Filled 2022-03-28: qty 1

## 2022-03-28 MED ORDER — CLONIDINE HCL 0.1 MG PO TABS
0.1000 mg | ORAL_TABLET | Freq: Three times a day (TID) | ORAL | Status: DC
  Administered 2022-03-28 – 2022-03-29 (×2): 0.1 mg via ORAL
  Filled 2022-03-28 (×3): qty 1

## 2022-03-28 MED ORDER — ATORVASTATIN CALCIUM 40 MG PO TABS
40.0000 mg | ORAL_TABLET | Freq: Every day | ORAL | Status: DC
  Filled 2022-03-28: qty 1

## 2022-03-28 MED ORDER — MAGNESIUM SULFATE 2 GM/50ML IV SOLN
2.0000 g | Freq: Once | INTRAVENOUS | Status: AC
  Administered 2022-03-28: 2 g via INTRAVENOUS
  Filled 2022-03-28: qty 50

## 2022-03-28 MED ORDER — GABAPENTIN 100 MG PO CAPS
100.0000 mg | ORAL_CAPSULE | Freq: Three times a day (TID) | ORAL | Status: DC
  Administered 2022-03-28 – 2022-03-29 (×2): 100 mg via ORAL
  Filled 2022-03-28 (×3): qty 1

## 2022-03-28 MED ORDER — DIAZEPAM 5 MG PO TABS
5.0000 mg | ORAL_TABLET | Freq: Once | ORAL | Status: AC
  Administered 2022-03-28: 5 mg via ORAL
  Filled 2022-03-28: qty 1

## 2022-03-28 MED ORDER — IPRATROPIUM-ALBUTEROL 0.5-2.5 (3) MG/3ML IN SOLN: 3.0000 mL | Freq: Four times a day (QID) | RESPIRATORY_TRACT | Status: DC | PRN

## 2022-03-28 MED ORDER — BUDESONIDE 0.25 MG/2ML IN SUSP
0.2500 mg | Freq: Two times a day (BID) | RESPIRATORY_TRACT | Status: DC
  Administered 2022-03-28 – 2022-03-29 (×2): 0.25 mg via RESPIRATORY_TRACT
  Filled 2022-03-28 (×2): qty 2

## 2022-03-28 MED ORDER — SIMETHICONE 80 MG PO CHEW: 80.0000 mg | CHEWABLE_TABLET | Freq: Four times a day (QID) | ORAL | Status: DC | PRN

## 2022-03-28 MED ORDER — METHYLPREDNISOLONE SODIUM SUCC 125 MG IJ SOLR
60.0000 mg | Freq: Two times a day (BID) | INTRAMUSCULAR | Status: DC
  Administered 2022-03-29: 60 mg via INTRAVENOUS
  Filled 2022-03-28: qty 2

## 2022-03-28 MED ORDER — ONDANSETRON HCL 4 MG PO TABS: 4.0000 mg | ORAL_TABLET | Freq: Four times a day (QID) | ORAL | Status: DC | PRN

## 2022-03-28 MED ORDER — APIXABAN 5 MG PO TABS
5.0000 mg | ORAL_TABLET | Freq: Two times a day (BID) | ORAL | Status: DC
  Administered 2022-03-28 – 2022-03-29 (×2): 5 mg via ORAL
  Filled 2022-03-28 (×2): qty 1

## 2022-03-28 MED ORDER — CHLORHEXIDINE GLUCONATE CLOTH 2 % EX PADS
6.0000 | MEDICATED_PAD | Freq: Every day | CUTANEOUS | Status: DC
  Administered 2022-03-28 – 2022-03-29 (×2): 6 via TOPICAL

## 2022-03-28 MED ORDER — ACETAMINOPHEN 650 MG RE SUPP: 650.0000 mg | Freq: Four times a day (QID) | RECTAL | Status: DC | PRN

## 2022-03-28 MED ORDER — IPRATROPIUM-ALBUTEROL 0.5-2.5 (3) MG/3ML IN SOLN
3.0000 mL | Freq: Four times a day (QID) | RESPIRATORY_TRACT | Status: DC
  Administered 2022-03-28 – 2022-03-29 (×3): 3 mL via RESPIRATORY_TRACT
  Filled 2022-03-28 (×4): qty 3

## 2022-03-28 NOTE — Consult Note (Signed)
NAME:  Tony Sullivan, MRN:  [id number], DOB:  07-Aug-1961, LOS: 0 ADMISSION DATE:  03/28/2022, CONSULTATION DATE:  03/28/22  REFERRING MD:  Shah/ Triad, CHIEF COMPLAINT:  acute on chronic resp failure    History of Present Illness:  76 yobm ? Still actively smoking with breathing difficulties since 2004  with  copd criteria  GOLD 3 02/2018  who has become gradually more debilitated and frail and  both 02 and pred dependent under my care and just admitted 03/13/22 with similar Presentation:   COPD  pt with chronic respiratory failure on 3-4L [place], dyslipidemia, hypertension, GERD, tobacco abuse who presented to the ED with complaints of chest pain and shortness of breath.  He states that he developed chest pain approximately 2 days  PTA on the left side of his chest with no radiation noted.  This went away after approximately 5 hours and then came back and remained constant.    He complains of an excessive amount of chest congestion and inability to cough up sputum.   He was recently admitted on 5/24 and discharged on 5/29 for treatment of acute on chronic hypoxemic respiratory failure secondary to COPD exacerbation and was also noted to have a left upper lobe endobronchial mass with hemoptysis that resulted in bronchoscopy on 5/26.  Prior to this, he was discharged on 5/19 with possible aspiration pneumonia and was noted to have a PE.   ED Course: Vital signs stable, but patient noted to have some respiratory distress for which she has been started on BiPAP.  Given some magnesium sulfate as well as Solu-Medrol and continuous nebulizer treatment.  Leukocytosis of 13.1 noted and chest x-ray with findings of hyperinflation and COPD.  Troponins mildly elevated, but stable and BNP 61.        Significant Hospital Events: Including procedures, antibiotic start and stop dates in addition to other pertinent events   03/19/2022 CT chest with contrast -endobronchial mass in LUL, extending into peripheral segmental  bronchi FOB 03/22/22 [place]: Endobronchial mass seen blocking take off of LUL bronchus, lingular bronchus was open. This was brushed & biopsied:  pending    Scheduled Meds:  albuterol  10 mg/hr Nebulization Once   apixaban  5 mg Oral BID   atorvastatin  40 mg Oral q1800   benztropine  0.5 mg Oral QHS   budesonide (PULMICORT) nebulizer solution  0.25 mg Nebulization BID   Chlorhexidine Gluconate Cloth  6 each Topical Daily   dorzolamide  1 drop Left Eye BID   gabapentin  100 mg Oral TID   ipratropium-albuterol  3 mL Nebulization Q6H   latanoprost  1 drop Both Eyes QHS   [START ON 03/29/2022] methylPREDNISolone (SOLU-MEDROL) injection  60 mg Intravenous Q12H   [START ON 03/29/2022] pantoprazole  40 mg Oral Daily   sodium chloride flush  3 mL Intravenous Q12H   Continuous Infusions:  sodium chloride     PRN Meds:.sodium chloride, acetaminophen **OR** acetaminophen, ipratropium-albuterol, ondansetron **OR** ondansetron (ZOFRAN) IV, senna-docusate, simethicone, sodium chloride flush    Interim History / Subjective:  Elderly bm > stated age improved sob on bipap still only speaking short phrases   Objective   Blood pressure 113/84, pulse 80, temperature (!) 97 F (36.1 C), temperature source Axillary, resp. rate 12, height 5\' 11"  (1.803 m), weight 65.8 kg, SpO2 100 %.    FiO2 (%):  [2 %] 2 %   Intake/Output Summary (Last 24 hours) at 03/28/2022 1607 Last data filed at 03/28/2022 1418 Gross  per 24 hour  Intake 45.94 ml  Output 150 ml  Net -104.06 ml   Filed Weights   03/28/22 0913  Weight: 65.8 kg    Examination: Tmax:  98.2 General appearance:   acutely ill but lying flat in bed on bipap, moderate increased wob     No jvd Oropharynx  bipap in place  Neck supple Lungs with extremely distant bs  bilaterally RRR no s3 or or sign murmur Abd soft/ limited excursion  Extr warm with no edema or clubbing noted Neuro  Sensorium alert ,  no apparent motor deficits    I personally  reviewed images and agree with radiology impression as follows:  CXR:   portable 6/1 Stable chest x-ray with hyperinflation and advanced emphysematous and bronchitic changes suggesting COPD.     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 Labs   CBC: Recent Labs  Lab 03/28/22 1016  WBC 13.1*  NEUTROABS 11.7*  HGB 13.1  HCT 42.8  MCV 103.9*  PLT 419    Basic Metabolic Panel: Recent Labs  Lab 03/23/22 0846 03/28/22 1016  NA 141 144  K 3.8 4.3  CL 95* 100  CO2 39* 35*  GLUCOSE 81 110*  BUN 19 19  CREATININE 0.73 0.84  CALCIUM 8.5* 9.2   GFR: Estimated Creatinine Clearance: 89.2 mL/min (by C-G formula based on SCr of 0.84 mg/dL). Recent Labs  Lab 03/28/22 1016  WBC 13.1*    Liver Function Tests: No results for input(s): AST, ALT, ALKPHOS, BILITOT, PROT, ALBUMIN in the last 168 hours. No results for input(s): LIPASE, AMYLASE in the last 168 hours. No results for input(s): AMMONIA in the last 168 hours.  ABG    Component Value Date/Time   PHART 7.46 (H) 03/21/2022 1208   PCO2ART 50 (H) 03/21/2022 1208   PO2ART 85 03/21/2022 1208   HCO3 40.8 (H) 03/28/2022 1252   TCO2 30 05/05/2021 0244   ACIDBASEDEF 2.0 09/30/2018 0939   O2SAT 84 03/28/2022 1252     Coagulation Profile: No results for input(s): INR, PROTIME in the last 168 hours.  Cardiac Enzymes: No results for input(s): CKTOTAL, CKMB, CKMBINDEX, TROPONINI in the last 168 hours.  HbA1C: Hgb A1c MFr Bld   Date/Time Value Ref Range Status  05/05/2021 02:29 AM 6.1 (H) 4.8 - 5.6 % Final    Comment:    (NOTE) Pre diabetes:          5.7%-6.4%  Diabetes:              >6.4%  Glycemic control for   <7.0% adults with diabetes     CBG: No results for input(s): GLUCAP in the last 168 hours.    Past Medical History:  He,  has a past medical history of Anxiety, Arthritis, COPD (chronic obstructive pulmonary disease) ([place]) (2004), Depression, GERD (gastroesophageal reflux disease), High blood pressure, Prediabetes, Seasonal allergies, Substance abuse ([place]), and Tobacco dependence.  Surgical History:   Past Surgical History:  Procedure Laterality Date   ANTERIOR CERVICAL DECOMP/DISCECTOMY FUSION N/A 09/06/2019   Procedure: Anterior Cervical Discecectomy Fusion - Cerivcal Five-Cervical Six;  Surgeon: Kary Kos, MD;  Location: [facility];  Service: Neurosurgery;  Laterality: N/A;  Anterior Cervical Discecectomy Fusion - Cerivcal Five-Cervical Six   ANTERIOR CERVICAL DISCECTOMY  09/06/2019   APPENDECTOMY  1980   COLONOSCOPY WITH PROPOFOL N/A 12/10/2016   Procedure: COLONOSCOPY WITH PROPOFOL;  Surgeon: [name], MD;  Location: [facility];  Service: Endoscopy;  Laterality: N/A;   HIP SURGERY Right    SHOULDER ARTHROSCOPY Right      Social History:   reports that he has been smoking cigarettes. He has a 9.00 pack-year smoking history. He has never used smokeless tobacco. He reports that he does not currently use alcohol after a past usage of about 14.0 - 21.0 standard drinks per week. He reports current drug use. Drugs: Marijuana and Cocaine.   Family History:  His family history includes Allergies in his father, mother, and son; Asthma in his mother; Cancer in his mother; Clotting disorder in his mother; Diabetes in his brother; Emphysema in his father and mother; Heart disease in his mother; Seizures in his brother.   Allergies Allergies  Allergen Reactions   Penicillins Anaphylaxis    Has  patient had a PCN reaction causing immediate rash, facial/tongue/throat swelling, SOB or lightheadedness with hypotension: Yes Has patient had a PCN reaction causing severe rash involving mucus membranes or skin necrosis: No Has patient had a PCN reaction that required hospitalization No Has patient had a PCN reaction occurring within the last 10 years: No If all of the above answers are "NO", then may proceed with Cephalosporin use.    Levocetirizine Other (See Comments)    Muscle cramps     Home Medications  Prior to Admission medications   Medication Sig Start Date End Date Taking? Authorizing Provider  acetaminophen (TYLENOL) 325 MG tablet Take 2 tablets (650 mg total) by mouth every 6 (six) hours as needed for mild pain (or Fever >/= 101). 10/30/21  Yes Emokpae, Courage, MD  albuterol (PROVENTIL) (2.5 MG/3ML) 0.083% nebulizer solution Take 3 mLs (2.5 mg total) by nebulization every 4 (four) hours as needed for wheezing or shortness of breath. 12/12/21 12/12/22 Yes [name], MD  albuterol (VENTOLIN HFA) 108 (90 Base) MCG/ACT inhaler Inhale 2 puffs into the lungs every 6 (six) hours as needed for wheezing or shortness of breath. 03/15/22  Yes Shah, Pratik D, DO  ALPRAZolam (XANAX) 0.5 MG tablet Take 1 tablet (0.5 mg total) by mouth 3 (three) times daily as needed for up to 5 days for anxiety. 03/25/22 03/30/22 Yes Shahmehdi, Valeria Batman, MD  apixaban (ELIQUIS) 5 MG TABS tablet Take 1 tablet (5 mg total) by mouth 2 (two) times daily. 03/22/22 05/21/22 Yes Shah, Pratik D, DO  atorvastatin (LIPITOR) 40 MG tablet TAKE ONE TABLET BY MOUTH DAILY Patient taking differently: Take 40 mg by mouth daily. 07/06/20  Yes [name], MD  benztropine (COGENTIN) 0.5 MG tablet Take 0.5 mg by mouth at bedtime.   Yes [provider]  Budeson-Glycopyrrol-Formoterol (BREZTRI AEROSPHERE) 160-9-4.8 MCG/ACT AERO Inhale 2 puffs into the lungs in the morning and at bedtime. 12/12/21  Yes [name], MD   diclofenac Sodium (VOLTAREN) 1 % GEL Apply 2 g topically 4 (four) times daily as needed (pain).   Yes [provider]  dorzolamide (TRUSOPT) 2 % ophthalmic solution  Place 1 drop into the left eye 2 (two) times daily. 12/12/20  Yes [provider]  doxycycline (VIBRA-TABS) 100 MG tablet Take 1 tablet (100 mg total) by mouth every 12 (twelve) hours for 5 days. 03/25/22 03/30/22 Yes Shahmehdi, Valeria Batman, MD  gabapentin (NEURONTIN) 100 MG capsule Take 1 capsule (100 mg total) by mouth 3 (three) times daily. 03/25/22 05/24/22 Yes Shahmehdi, Valeria Batman, MD  guaiFENesin 200 MG tablet Take 2 tablets (400 mg total) by mouth every 6 (six) hours as needed for cough or to loosen phlegm. 03/02/22  Yes Tat, Shanon Brow, MD  latanoprost (XALATAN) 0.005 % ophthalmic solution Place 1 drop into both eyes at bedtime. 05/24/19  Yes [provider]  loxapine (LOXITANE) 10 MG capsule Take 10 mg by mouth at bedtime.    Yes [provider]  mirtazapine (REMERON) 30 MG tablet Take 1 tablet (30 mg total) by mouth at bedtime. 10/30/21  Yes Emokpae, Courage, MD  Multiple Vitamin (MULTIVITAMIN WITH MINERALS) TABS tablet Take 1 tablet by mouth daily. 10/31/21  Yes Emokpae, Courage, MD  OXYGEN Inhale 3-4 L into the lungs as directed. 3-4 liters with sleep and exertion as needed for low oxygen   Yes [provider]  pantoprazole (PROTONIX) 40 MG tablet Take 1 tablet (40 mg total) by mouth daily. 10/30/21  Yes Emokpae, Courage, MD  polyethylene glycol (MIRALAX / GLYCOLAX) 17 g packet TAKE 17 GRAMS BY MOUTH DAILY Patient taking differently: Take 17 g by mouth daily as needed for mild constipation. 03/09/20  Yes [name], MD  senna-docusate (SENOKOT-S) 8.6-50 MG tablet Take 2 tablets by mouth at bedtime. Patient taking differently: Take 2 tablets by mouth at bedtime as needed for mild constipation. 10/30/21  Yes [name], MD  simethicone (MYLICON) 80 MG chewable tablet Chew 1 tablet (80 mg total) by mouth  4 (four) times daily as needed for flatulence. 03/15/22  Yes Shah, Pratik D, DO  zolpidem (AMBIEN) 5 MG tablet Take 1 tablet (5 mg total) by mouth at bedtime as needed for up to 20 days for sleep. 03/25/22 04/14/22 Yes Shahmehdi, Valeria Batman, MD  methylPREDNISolone (MEDROL DOSEPAK) 4 MG TBPK tablet Medrol Dosepak take as instructed Patient not taking: Reported on 03/28/2022 03/25/22   Deatra James, MD     The patient is critically ill with multiple organ systems failure and requires high complexity decision making for assessment and support, frequent evaluation and titration of therapies, application of advanced monitoring technologies and extensive interpretation of multiple databases. Critical Care Time devoted to patient care services described in this note is 45 minutes.   Christinia Gully, MD [facility] [place] [phone]   After 7:00 pm call Elink  [phone]

## 2022-03-28 NOTE — H&P (Signed)
History and Physical    Tony Sullivan YBO:[id number] DOB: [birth year] DOA: 03/28/2022  PCP: [name], FNP   Patient coming from: Home  Chief Complaint: Chest pain/dyspnea  HPI: Tony Sullivan is a 61 y.o. male with medical history significant for COPD with chronic respiratory failure on 3-4L [place], dyslipidemia, hypertension, GERD, tobacco abuse who presented to the ED with complaints of chest pain and shortness of breath.  He states that he developed chest pain approximately 2 days ago on the left side of his chest with no radiation noted.  This went away after approximately 5 hours and then came back and remained constant.  He is now feeling more short of breath but denies any pleuritic chest pain, lightheadedness, dizziness, fevers, or chills.  He complains of an excessive amount of chest congestion and inability to cough up sputum.  He was recently admitted on 5/24 and discharged on 5/29 for treatment of acute on chronic hypoxemic respiratory failure secondary to COPD exacerbation and was also noted to have a left upper lobe endobronchial mass with hemoptysis that resulted in bronchoscopy on 5/26.  Prior to this, he was discharged on 5/19 with possible aspiration pneumonia and was noted to have a PE.   ED Course: Vital signs stable, but patient noted to have some respiratory distress for which she has been started on BiPAP.  Given some magnesium sulfate as well as Solu-Medrol and continuous nebulizer treatment.  Leukocytosis of 13.1 noted and chest x-ray with findings of hyperinflation and COPD.  Troponins mildly elevated, but stable and BNP 61.  Review of Systems: Reviewed as noted above, otherwise negative.  Past Medical History:  Diagnosis Date   Anxiety    Arthritis    COPD (chronic obstructive pulmonary disease) ([place]) 2004   diagnosed in 2004, no PFT's to date.  Started on home O2 12/2013, after found to be desatting at PCP's office, and referred to pulmonology.   Depression     GERD (gastroesophageal reflux disease)    High blood pressure    Prediabetes    Seasonal allergies    Substance abuse ([place])    Tobacco dependence     Past Surgical History:  Procedure Laterality Date   ANTERIOR CERVICAL DECOMP/DISCECTOMY FUSION N/A 09/06/2019   Procedure: Anterior Cervical Discecectomy Fusion - Cerivcal Five-Cervical Six;  Surgeon: Kary Kos, MD;  Location: [facility];  Service: Neurosurgery;  Laterality: N/A;  Anterior Cervical Discecectomy Fusion - Cerivcal Five-Cervical Six   ANTERIOR CERVICAL DISCECTOMY  09/06/2019   APPENDECTOMY  1980   COLONOSCOPY WITH PROPOFOL N/A 12/10/2016   Procedure: COLONOSCOPY WITH PROPOFOL;  Surgeon: [name], MD;  Location: [facility];  Service: Endoscopy;  Laterality: N/A;   HIP SURGERY Right    SHOULDER ARTHROSCOPY Right      reports that he has been smoking cigarettes. He has a 9.00 pack-year smoking history. He has never used smokeless tobacco. He reports that he does not currently use alcohol after a past usage of about 14.0 - 21.0 standard drinks per week. He reports current drug use. Drugs: Marijuana and Cocaine.  Allergies  Allergen Reactions   Penicillins Anaphylaxis    Has patient had a PCN reaction causing immediate rash, facial/tongue/throat swelling, SOB or lightheadedness with hypotension: Yes Has patient had a PCN reaction causing severe rash involving mucus membranes or skin necrosis: No Has patient had a PCN reaction that required hospitalization No Has patient had a PCN reaction occurring within the last 10 years: No If all  of the above answers are "NO", then may proceed with Cephalosporin use.    Levocetirizine Other (See Comments)    Muscle cramps    Family History  Problem Relation Age of Onset   Emphysema Mother    Allergies Mother        "everyone in family"   Asthma Mother        "everyone in family"   Heart disease Mother    Clotting disorder Mother    Cancer Mother    Emphysema Father    Allergies  Father    Allergies Son    Seizures Brother    Diabetes Brother     Prior to Admission medications   Medication Sig Start Date End Date Taking? Authorizing Provider  acetaminophen (TYLENOL) 325 MG tablet Take 2 tablets (650 mg total) by mouth every 6 (six) hours as needed for mild pain (or Fever >/= 101). 10/30/21  Yes Emokpae, Courage, MD  albuterol (PROVENTIL) (2.5 MG/3ML) 0.083% nebulizer solution Take 3 mLs (2.5 mg total) by nebulization every 4 (four) hours as needed for wheezing or shortness of breath. 12/12/21 12/12/22 Yes [name], MD  albuterol (VENTOLIN HFA) 108 (90 Base) MCG/ACT inhaler Inhale 2 puffs into the lungs every 6 (six) hours as needed for wheezing or shortness of breath. 03/15/22  Yes Dolph Tavano D, DO  ALPRAZolam (XANAX) 0.5 MG tablet Take 1 tablet (0.5 mg total) by mouth 3 (three) times daily as needed for up to 5 days for anxiety. 03/25/22 03/30/22 Yes Shahmehdi, Valeria Batman, MD  apixaban (ELIQUIS) 5 MG TABS tablet Take 1 tablet (5 mg total) by mouth 2 (two) times daily. 03/22/22 05/21/22 Yes Larenzo Caples D, DO  atorvastatin (LIPITOR) 40 MG tablet TAKE ONE TABLET BY MOUTH DAILY Patient taking differently: Take 40 mg by mouth daily. 07/06/20  Yes [name], MD  benztropine (COGENTIN) 0.5 MG tablet Take 0.5 mg by mouth at bedtime.   Yes [provider]  Budeson-Glycopyrrol-Formoterol (BREZTRI AEROSPHERE) 160-9-4.8 MCG/ACT AERO Inhale 2 puffs into the lungs in the morning and at bedtime. 12/12/21  Yes [name], MD  diclofenac Sodium (VOLTAREN) 1 % GEL Apply 2 g topically 4 (four) times daily as needed (pain).   Yes [provider]  dorzolamide (TRUSOPT) 2 % ophthalmic solution Place 1 drop into the left eye 2 (two) times daily. 12/12/20  Yes [provider]  doxycycline (VIBRA-TABS) 100 MG tablet Take 1 tablet (100 mg total) by mouth every 12 (twelve) hours for 5 days. 03/25/22 03/30/22 Yes Shahmehdi, Valeria Batman, MD  gabapentin (NEURONTIN) 100 MG  capsule Take 1 capsule (100 mg total) by mouth 3 (three) times daily. 03/25/22 05/24/22 Yes Shahmehdi, Valeria Batman, MD  guaiFENesin 200 MG tablet Take 2 tablets (400 mg total) by mouth every 6 (six) hours as needed for cough or to loosen phlegm. 03/02/22  Yes Tat, Shanon Brow, MD  latanoprost (XALATAN) 0.005 % ophthalmic solution Place 1 drop into both eyes at bedtime. 05/24/19  Yes [provider]  loxapine (LOXITANE) 10 MG capsule Take 10 mg by mouth at bedtime.    Yes [provider]  mirtazapine (REMERON) 30 MG tablet Take 1 tablet (30 mg total) by mouth at bedtime. 10/30/21  Yes Emokpae, Courage, MD  Multiple Vitamin (MULTIVITAMIN WITH MINERALS) TABS tablet Take 1 tablet by mouth daily. 10/31/21  Yes Emokpae, Courage, MD  OXYGEN Inhale 3-4 L into the lungs as directed. 3-4 liters with sleep and exertion as needed for low  oxygen   Yes [provider]  pantoprazole (PROTONIX) 40 MG tablet Take 1 tablet (40 mg total) by mouth daily. 10/30/21  Yes Emokpae, Courage, MD  polyethylene glycol (MIRALAX / GLYCOLAX) 17 g packet TAKE 17 GRAMS BY MOUTH DAILY Patient taking differently: Take 17 g by mouth daily as needed for mild constipation. 03/09/20  Yes [name], MD  senna-docusate (SENOKOT-S) 8.6-50 MG tablet Take 2 tablets by mouth at bedtime. Patient taking differently: Take 2 tablets by mouth at bedtime as needed for mild constipation. 10/30/21  Yes [name], MD  simethicone (MYLICON) 80 MG chewable tablet Chew 1 tablet (80 mg total) by mouth 4 (four) times daily as needed for flatulence. 03/15/22  Yes Jontue Crumpacker D, DO  zolpidem (AMBIEN) 5 MG tablet Take 1 tablet (5 mg total) by mouth at bedtime as needed for up to 20 days for sleep. 03/25/22 04/14/22 Yes Shahmehdi, Valeria Batman, MD  methylPREDNISolone (MEDROL DOSEPAK) 4 MG TBPK tablet Medrol Dosepak take as instructed Patient not taking: Reported on 03/28/2022 03/25/22   Deatra James, MD    Physical Exam: Vitals:   03/28/22 1343  03/28/22 1400 03/28/22 1430 03/28/22 1500  BP:  112/80 126/83 125/81  Pulse:   77 74  Resp:  16 13 14   Temp:      TempSrc:      SpO2: 96% 96% 97%   Weight:      Height:        Constitutional: Somnolent, on BiPAP but arousable Vitals:   03/28/22 1343 03/28/22 1400 03/28/22 1430 03/28/22 1500  BP:  112/80 126/83 125/81  Pulse:   77 74  Resp:  16 13 14   Temp:      TempSrc:      SpO2: 96% 96% 97%   Weight:      Height:       Eyes: lids and conjunctivae normal Neck: normal, supple Respiratory: clear to auscultation bilaterally. Normal respiratory effort. No accessory muscle use.  Currently on BiPAP 14/6, 40% Cardiovascular: Regular rate and rhythm, no murmurs. Abdomen: no tenderness, no distention. Bowel sounds positive.  Musculoskeletal:  No edema. Skin: no rashes, lesions, ulcers.  Psychiatric: Flat affect  Labs on Admission: I have personally reviewed following labs and imaging studies  CBC: Recent Labs  Lab 03/28/22 1016  WBC 13.1*  NEUTROABS 11.7*  HGB 13.1  HCT 42.8  MCV 103.9*  PLT 825   Basic Metabolic Panel: Recent Labs  Lab 03/23/22 0846 03/28/22 1016  NA 141 144  K 3.8 4.3  CL 95* 100  CO2 39* 35*  GLUCOSE 81 110*  BUN 19 19  CREATININE 0.73 0.84  CALCIUM 8.5* 9.2   GFR: Estimated Creatinine Clearance: 89.2 mL/min (by C-G formula based on SCr of 0.84 mg/dL). Liver Function Tests: No results for input(s): AST, ALT, ALKPHOS, BILITOT, PROT, ALBUMIN in the last 168 hours. No results for input(s): LIPASE, AMYLASE in the last 168 hours. No results for input(s): AMMONIA in the last 168 hours. Coagulation Profile: No results for input(s): INR, PROTIME in the last 168 hours. Cardiac Enzymes: No results for input(s): CKTOTAL, CKMB, CKMBINDEX, TROPONINI in the last 168 hours. BNP (last 3 results) No results for input(s): PROBNP in the last 8760 hours. HbA1C: No results for input(s): HGBA1C in the last 72 hours. CBG: No results for input(s): GLUCAP  in the last 168 hours. Lipid Profile: No results for input(s): CHOL, HDL, LDLCALC, TRIG, CHOLHDL, LDLDIRECT in the last 72 hours. Thyroid Function  Tests: No results for input(s): TSH, T4TOTAL, FREET4, T3FREE, THYROIDAB in the last 72 hours. Anemia Panel: No results for input(s): VITAMINB12, FOLATE, FERRITIN, TIBC, IRON, RETICCTPCT in the last 72 hours. Urine analysis:    Component Value Date/Time   COLORURINE YELLOW 10/16/2021 0130   APPEARANCEUR CLEAR 10/16/2021 0130   LABSPEC 1.020 10/16/2021 0130   PHURINE 6.0 10/16/2021 0130   GLUCOSEU NEGATIVE 10/16/2021 0130   HGBUR TRACE (A) 10/16/2021 0130   BILIRUBINUR NEGATIVE 10/16/2021 0130   KETONESUR NEGATIVE 10/16/2021 0130   PROTEINUR NEGATIVE 10/16/2021 0130   UROBILINOGEN 1.0 06/12/2015 1021   NITRITE NEGATIVE 10/16/2021 0130   LEUKOCYTESUR NEGATIVE 10/16/2021 0130    Radiological Exams on Admission: DG Chest Portable 1 View  Result Date: 03/28/2022 CLINICAL DATA:  SOB EXAM: PORTABLE CHEST 1 VIEW COMPARISON:  Most recent prior chest x-ray 03/21/2022; CT scan of the chest 03/18/2022 FINDINGS: Cardiac and mediastinal contours are within normal limits. Mild atherosclerotic calcifications in the transverse aorta. As before, advanced emphysematous and chronic bronchitic changes present bilaterally. No focal airspace infiltrate. Remote healed fracture of the posterior aspect of the right sixth rib gives the pseudo appearance of a pulmonary nodule. No pleural effusion or pneumothorax. IMPRESSION: Stable chest x-ray with hyperinflation and advanced emphysematous and bronchitic changes suggesting COPD. No acute cardiopulmonary process. Electronically Signed   By: Jacqulynn Cadet M.D.   On: 03/28/2022 10:29    EKG: Independently reviewed. SR 96bpm.  Assessment/Plan Principal Problem:   Acute exacerbation of chronic obstructive pulmonary disease (COPD) ([facility]) Active Problems:   Leukocytosis   Chronic respiratory failure with hypoxia ([facility])    Essential hypertension   Atypical chest pain   GERD (gastroesophageal reflux disease)   Cigarette smoker   Bipolar disorder ([facility])   Mixed hyperlipidemia   Physical deconditioning   Thoracic compression fracture ([facility])   Mass of upper lobe of left lung    Recurrent, acute COPD exacerbation -Chest pain likely associated with minimal troponin elevation/flat trend and no significant findings on EKG -Maintain on DuoNebs scheduled -Wean off BiPAP -Pulmicort twice daily -Solu-Medrol -Appreciate pulmonology evaluation -2D echocardiogram performed earlier in 5/23 with EF 60-65% and grade 1 diastolic dysfunction  Acute hypercapnic respiratory failure secondary to above -Monitor blood gas carefully and maintain on BiPAP  Recent bronchoscopy with biopsy of left upper lobe mass -Suspicious for lung cancer -Appreciate palliative evaluation for goals of care discussion given recurrent admissions  Recent PE -Continues on Eliquis at home  Recent treatment for pneumonia -Check procalcitonin and avoid antibiotics for now  Prior thoracic compression fracture -This is stable from 2019  Essential hypertension -Continue losartan  Dyslipidemia -Continue Lipitor  GERD -PPI  Tobacco abuse -Counseled on cessation  Generalized debility/weakness -PT recommended SNF on last admission, but this was declined -He was set up with outpatient palliative services   DVT prophylaxis: Eliquis Code Status: Full Family Communication: None at bedside Disposition Plan:Admit for COPD exacerbation Consults called:Pulmonology, palliative Admission status: Inpatient, SDU  Severity of Illness: The appropriate patient status for this patient is INPATIENT. Inpatient status is judged to be reasonable and necessary in order to provide the required intensity of service to ensure the patient's safety. The patient's presenting symptoms, physical exam findings, and initial radiographic and laboratory data in the  context of their chronic comorbidities is felt to place them at high risk for further clinical deterioration. Furthermore, it is not anticipated that the patient will be medically stable for discharge from the hospital within 2 midnights of admission.   *  I certify that at the point of admission it is my clinical judgment that the patient will require inpatient hospital care spanning beyond 2 midnights from the point of admission due to high intensity of service, high risk for further deterioration and high frequency of surveillance required.*   Orian Amberg D Demoni Gergen DO [facility]  If 7PM-7AM, please contact night-coverage [email or web address]  03/28/2022, 3:07 PM

## 2022-03-28 NOTE — H&P (Signed)
History and Physical    Patient: Tony Sullivan MBW:[id number] DOB: 02-21-1961 DOA: 02/27/2022 DOS: the patient was seen and examined on 02/27/2022 PCP: [name], FNP  Patient coming from: Home  Chief Complaint:  Chief Complaint  Patient presents with   Shortness of Breath   HPI: Tony Sullivan is a 62 year old male with a history of COPD, tobacco abuse, hypertension, chronic respiratory failure on 3 L, anxiety, GERD presenting with shortness of breath and rib pain bilateral.  The patient was recently admitted to the hospital from 02/19/2022 to 02/21/2022 for COPD exacerbation.  He was discharged home with levofloxacin and prednisone.  The patient states that he was breathing better at the time of discharge, but did not feel like he was ready to go home.  Since going home, he continued to smoke at least 4 cigarettes/day.  He denies any illicit drug use.  He denies any fevers, chills, nausea, vomiting, diarrhea, hematochezia, melena.  There is no hemoptysis.  He complains of bilateral lower rib pain with coughing.  He denies any worsening lower extremity edema.  He sleeps on a hospital bed normally. In the ED, the patient was afebrile and hemodynamically stable with oxygen saturation 96% on 3 L.  BMP showed sodium 142, potassium 3.4, bicarbonate 33, BUN 27, creatinine 1.18.  WBC 17.8, hemoglobin 15.2, platelets 263,000.  Chest x-ray showed chronic interstitial markings.  The patient was started on DuoNebs and given Solu-Medrol IV 125 mg.  The patient was admitted for further evaluation and treatment.    Review of Systems: As mentioned in the history of present illness. All other systems reviewed and are negative. Past Medical History:  Diagnosis Date   Anxiety    Arthritis    COPD (chronic obstructive pulmonary disease) ([place]) 2004   diagnosed in 2004, no PFT's to date.  Started on home O2 12/2013, after found to be desatting at PCP's office, and referred to pulmonology.   Depression    GERD  (gastroesophageal reflux disease)    High blood pressure    Prediabetes    Seasonal allergies    Substance abuse ([place])    Tobacco dependence    Past Surgical History:  Procedure Laterality Date   ANTERIOR CERVICAL DECOMP/DISCECTOMY FUSION N/A 09/06/2019   Procedure: Anterior Cervical Discecectomy Fusion - Cerivcal Five-Cervical Six;  Surgeon: Kary Kos, MD;  Location: [facility];  Service: Neurosurgery;  Laterality: N/A;  Anterior Cervical Discecectomy Fusion - Cerivcal Five-Cervical Six   ANTERIOR CERVICAL DISCECTOMY  09/06/2019   APPENDECTOMY  1980   COLONOSCOPY WITH PROPOFOL N/A 12/10/2016   Procedure: COLONOSCOPY WITH PROPOFOL;  Surgeon: [name], MD;  Location: [facility];  Service: Endoscopy;  Laterality: N/A;   HIP SURGERY Right    SHOULDER ARTHROSCOPY Right    Social History:  reports that he has been smoking cigarettes. He has a 9.00 pack-year smoking history. He has never used smokeless tobacco. He reports that he does not currently use alcohol after a past usage of about 14.0 - 21.0 standard drinks per week. He reports current drug use. Drugs: Marijuana and Cocaine.  Allergies  Allergen Reactions   Penicillins Anaphylaxis    Has patient had a PCN reaction causing immediate rash, facial/tongue/throat swelling, SOB or lightheadedness with hypotension: Yes Has patient had a PCN reaction causing severe rash involving mucus membranes or skin necrosis: No Has patient had a PCN reaction that required hospitalization No Has patient had a PCN reaction occurring within the last 10 years: No  If all of the above answers are "NO", then may proceed with Cephalosporin use.    Levocetirizine Other (See Comments)    Muscle cramps    Family History  Problem Relation Age of Onset   Emphysema Mother    Allergies Mother        "everyone in family"   Asthma Mother        "everyone in family"   Heart disease Mother    Clotting disorder Mother    Cancer Mother    Emphysema Father     Allergies Father    Allergies Son    Seizures Brother    Diabetes Brother     Prior to Admission medications   Medication Sig Start Date End Date Taking? Authorizing Provider  acetaminophen (TYLENOL) 325 MG tablet Take 2 tablets (650 mg total) by mouth every 6 (six) hours as needed for mild pain (or Fever >/= 101). 10/30/21  Yes Emokpae, Courage, MD  albuterol (PROVENTIL) (2.5 MG/3ML) 0.083% nebulizer solution Take 3 mLs (2.5 mg total) by nebulization every 4 (four) hours as needed for wheezing or shortness of breath. 12/12/21 12/12/22 Yes [name], MD  atorvastatin (LIPITOR) 40 MG tablet TAKE ONE TABLET BY MOUTH DAILY Patient taking differently: Take 40 mg by mouth daily. 07/06/20  Yes [name], MD  benztropine (COGENTIN) 0.5 MG tablet Take 0.5 mg by mouth at bedtime.   Yes [provider]  Budeson-Glycopyrrol-Formoterol (BREZTRI AEROSPHERE) 160-9-4.8 MCG/ACT AERO Inhale 2 puffs into the lungs in the morning and at bedtime. 12/12/21  Yes [name], MD  diclofenac Sodium (VOLTAREN) 1 % GEL Apply 2 g topically 4 (four) times daily as needed (pain).   Yes [provider]  dorzolamide (TRUSOPT) 2 % ophthalmic solution Place 1 drop into the left eye 2 (two) times daily. 12/12/20  Yes [provider]  latanoprost (XALATAN) 0.005 % ophthalmic solution Place 1 drop into both eyes at bedtime. 05/24/19  Yes [provider]  loxapine (LOXITANE) 10 MG capsule Take 10 mg by mouth at bedtime.    Yes [provider]  mirtazapine (REMERON) 30 MG tablet Take 1 tablet (30 mg total) by mouth at bedtime. 10/30/21  Yes Emokpae, Courage, MD  Multiple Vitamin (MULTIVITAMIN WITH MINERALS) TABS tablet Take 1 tablet by mouth daily. 10/31/21  Yes Emokpae, Courage, MD  OXYGEN Inhale 3-4 L into the lungs as directed. 3-4 liters with sleep and exertion as needed for low oxygen   Yes [provider]  pantoprazole (PROTONIX) 40 MG tablet Take 1 tablet (40 mg total)  by mouth daily. 10/30/21  Yes Emokpae, Courage, MD  polyethylene glycol (MIRALAX / GLYCOLAX) 17 g packet TAKE 17 GRAMS BY MOUTH DAILY Patient taking differently: Take 17 g by mouth daily as needed for mild constipation. 03/09/20  Yes [name], MD  senna-docusate (SENOKOT-S) 8.6-50 MG tablet Take 2 tablets by mouth at bedtime. Patient taking differently: Take 2 tablets by mouth at bedtime as needed for mild constipation. 10/30/21  Yes Emokpae, Courage, MD  albuterol (VENTOLIN HFA) 108 (90 Base) MCG/ACT inhaler Inhale 2 puffs into the lungs every 6 (six) hours as needed for wheezing or shortness of breath. 03/15/22   [name], Pratik D, DO  ALPRAZolam Duanne Moron) 0.5 MG tablet Take 1 tablet (0.5 mg total) by mouth 3 (three) times daily as needed for up to 5 days for anxiety. 03/25/22 03/30/22  Shahmehdi, Valeria Batman, MD  apixaban (ELIQUIS) 5 MG TABS tablet Take 1 tablet (  5 mg total) by mouth 2 (two) times daily. 03/22/22 05/21/22  [name], Pratik D, DO  doxycycline (VIBRA-TABS) 100 MG tablet Take 1 tablet (100 mg total) by mouth every 12 (twelve) hours for 5 days. 03/25/22 03/30/22  Shahmehdi, Valeria Batman, MD  gabapentin (NEURONTIN) 100 MG capsule Take 1 capsule (100 mg total) by mouth 3 (three) times daily. 03/25/22 05/24/22  Deatra James, MD  guaiFENesin 200 MG tablet Take 2 tablets (400 mg total) by mouth every 6 (six) hours as needed for cough or to loosen phlegm. 03/02/22   Orson Eva, MD  methylPREDNISolone (MEDROL DOSEPAK) 4 MG TBPK tablet Medrol Dosepak take as instructed Patient not taking: Reported on 03/28/2022 03/25/22   Deatra James, MD  simethicone (MYLICON) 80 MG chewable tablet Chew 1 tablet (80 mg total) by mouth 4 (four) times daily as needed for flatulence. 03/15/22   [name], Pratik D, DO  zolpidem (AMBIEN) 5 MG tablet Take 1 tablet (5 mg total) by mouth at bedtime as needed for up to 20 days for sleep. 03/25/22 04/14/22  Deatra James, MD    Physical Exam: Vitals:   03/01/22 1950 03/02/22 0524 03/02/22  0808 03/02/22 0811  BP:  (!) 150/96    Pulse: 72 73    Resp: 20 17    Temp:      TempSrc:      SpO2:  97% 100% 100%  Weight:      Height:       GENERAL:  A&O x 3, NAD, well developed, cooperative, follows commands HEENT: /AT, No thrush, No icterus, No oral ulcers Neck:  No neck mass, No meningismus, soft, supple CV: RRR, no S3, no S4, no rub, no JVD Lungs:  diminished BS.  Bibasilar crackles.  Bilateral exp wheeze Abd: soft/NT +BS, nondistended Ext: No edema, no lymphangitis, no cyanosis, no rashes Neuro:  CN II-XII intact, strength 4/5 in RUE, RLE, strength 4/5 LUE, LLE; sensation intact bilateral; no dysmetria; babinski equivocal  Data Reviewed: Data reviewed in history  Assessment and Plan: COPD exacerbation ([place]) Continue DuoNebs continue Pulmicort continue Brovana  Continue IV Solu-Medrol>>d/c home with prednisone taper over 2 weeks Add mucinex Started yupelri RN respiratory reporting pt has been occasionally refusing meds   Hypokalemia Replete Check magnesium--2.5  Mixed hyperlipidemia Continue statin  Atypical chest pain Troponins unremarkable Echo--EF 60-65%, no WMA, G1DD EKG with nonspecific T wave changes  Essential hypertension Continue losartan  Chronic respiratory failure with hypoxia ([facility]) Chronically on 3 L Remained stable on 3L through the hospitalization  Tobacco abuse Tobacco cessation discussed      Advance Care Planning: FULL  Consults: none  Family Communication: none  Severity of Illness: The appropriate patient status for this patient is INPATIENT. Inpatient status is judged to be reasonable and necessary in order to provide the required intensity of service to ensure the patient's safety. The patient's presenting symptoms, physical exam findings, and initial radiographic and laboratory data in the context of their chronic comorbidities is felt to place them at high risk for further clinical deterioration. Furthermore, it is  not anticipated that the patient will be medically stable for discharge from the hospital within 2 midnights of admission.   * I certify that at the point of admission it is my clinical judgment that the patient will require inpatient hospital care spanning beyond 2 midnights from the point of admission due to high intensity of service, high risk for further deterioration and high frequency of surveillance required.*  Author:  Orson Eva, MD 03/28/2022 5:50 PM  For on call review [email or web address].

## 2022-03-28 NOTE — Progress Notes (Signed)
AP IC03 Manufacturing engineer [facility]) Hospital Liaison note:  This is a pending outpatient-based Palliative Care patient. Will continue to follow for disposition.  Please call with any outpatient palliative questions or concerns.  Thank you, Lorelee Market, LPN [facility] Liaison [phone]

## 2022-03-28 NOTE — ED Triage Notes (Signed)
Pt c/o chest pain and sob for the last few day; pt states he thinks his elequis is causing these problems; pt states he feels weak

## 2022-03-28 NOTE — ED Provider Notes (Signed)
[facility] Provider Note   CSN: [id number] Arrival date & time: 03/28/22  4431     History  Chief Complaint  Patient presents with   Chest Pain    Tony Sullivan is a 61 y.o. male.  HPI  With medical history including chronic respiratory failure on 4 L via nasal cannula secondary due to COPD, current tobacco user, hypertension, GERD, recent diagnosed with a PE on Eliquis, with left upper lobe mass likely cancer present with complaints of shortness of breath and chest pain.  Patient states he developed chest pain about 2 days ago, came on suddenly, pain remains left side of his chest does not radiate describes it as a warm like sensation, states last about 5 hours then went away.  Then yesterday he had another episode of chest pain left side of his chest and has remained constant, does not radiate states he feels more short of breath than usual but denies pleuritic chest pain denies becoming diaphoretic lightheaded or dizziness.  He states he has been compliant with his Eliquis has not missed any dosages, states he has continued cough and unable to get the sputum out, denies fevers chills stomach pains nausea vomit general body aches.  Was recent admitted to the hospital on 05/17 discharged on the 23rd, found that he had a small acute thrombosis in the posterior right upper lobe there is no associated heart strain.  It is also that he had left lung mass which has been present, underwent bronchoscope and the mass seems most consistent with lung cancer.  He was discharged home on doxycycline for treatment of pneumonia, Eliquis for PE and will follow up with pulm for lung mass.    Home Medications Prior to Admission medications   Medication Sig Start Date End Date Taking? Authorizing Provider  acetaminophen (TYLENOL) 325 MG tablet Take 2 tablets (650 mg total) by mouth every 6 (six) hours as needed for mild pain (or Fever >/= 101). 10/30/21  Yes Emokpae, Courage, MD   albuterol (PROVENTIL) (2.5 MG/3ML) 0.083% nebulizer solution Take 3 mLs (2.5 mg total) by nebulization every 4 (four) hours as needed for wheezing or shortness of breath. 12/12/21 12/12/22 Yes [name], MD  albuterol (VENTOLIN HFA) 108 (90 Base) MCG/ACT inhaler Inhale 2 puffs into the lungs every 6 (six) hours as needed for wheezing or shortness of breath. 03/15/22  Yes Shah, Pratik D, DO  ALPRAZolam (XANAX) 0.5 MG tablet Take 1 tablet (0.5 mg total) by mouth 3 (three) times daily as needed for up to 5 days for anxiety. 03/25/22 03/30/22 Yes Shahmehdi, Valeria Batman, MD  apixaban (ELIQUIS) 5 MG TABS tablet Take 1 tablet (5 mg total) by mouth 2 (two) times daily. 03/22/22 05/21/22 Yes Shah, Pratik D, DO  atorvastatin (LIPITOR) 40 MG tablet TAKE ONE TABLET BY MOUTH DAILY Patient taking differently: Take 40 mg by mouth daily. 07/06/20  Yes [name], MD  benztropine (COGENTIN) 0.5 MG tablet Take 0.5 mg by mouth at bedtime.   Yes [provider]  Budeson-Glycopyrrol-Formoterol (BREZTRI AEROSPHERE) 160-9-4.8 MCG/ACT AERO Inhale 2 puffs into the lungs in the morning and at bedtime. 12/12/21  Yes [name], MD  diclofenac Sodium (VOLTAREN) 1 % GEL Apply 2 g topically 4 (four) times daily as needed (pain).   Yes [provider]  dorzolamide (TRUSOPT) 2 % ophthalmic solution Place 1 drop into the left eye 2 (two) times daily. 12/12/20  Yes [provider]  doxycycline (VIBRA-TABS) 100  MG tablet Take 1 tablet (100 mg total) by mouth every 12 (twelve) hours for 5 days. 03/25/22 03/30/22 Yes Shahmehdi, Valeria Batman, MD  gabapentin (NEURONTIN) 100 MG capsule Take 1 capsule (100 mg total) by mouth 3 (three) times daily. 03/25/22 05/24/22 Yes Shahmehdi, Valeria Batman, MD  guaiFENesin 200 MG tablet Take 2 tablets (400 mg total) by mouth every 6 (six) hours as needed for cough or to loosen phlegm. 03/02/22  Yes Tat, Shanon Brow, MD  latanoprost (XALATAN) 0.005 % ophthalmic solution Place 1 drop into both eyes at  bedtime. 05/24/19  Yes [provider]  loxapine (LOXITANE) 10 MG capsule Take 10 mg by mouth at bedtime.    Yes [provider]  mirtazapine (REMERON) 30 MG tablet Take 1 tablet (30 mg total) by mouth at bedtime. 10/30/21  Yes Emokpae, Courage, MD  Multiple Vitamin (MULTIVITAMIN WITH MINERALS) TABS tablet Take 1 tablet by mouth daily. 10/31/21  Yes Emokpae, Courage, MD  OXYGEN Inhale 3-4 L into the lungs as directed. 3-4 liters with sleep and exertion as needed for low oxygen   Yes [provider]  pantoprazole (PROTONIX) 40 MG tablet Take 1 tablet (40 mg total) by mouth daily. 10/30/21  Yes Emokpae, Courage, MD  polyethylene glycol (MIRALAX / GLYCOLAX) 17 g packet TAKE 17 GRAMS BY MOUTH DAILY Patient taking differently: Take 17 g by mouth daily as needed for mild constipation. 03/09/20  Yes [name], MD  senna-docusate (SENOKOT-S) 8.6-50 MG tablet Take 2 tablets by mouth at bedtime. Patient taking differently: Take 2 tablets by mouth at bedtime as needed for mild constipation. 10/30/21  Yes [name], MD  simethicone (MYLICON) 80 MG chewable tablet Chew 1 tablet (80 mg total) by mouth 4 (four) times daily as needed for flatulence. 03/15/22  Yes Shah, Pratik D, DO  zolpidem (AMBIEN) 5 MG tablet Take 1 tablet (5 mg total) by mouth at bedtime as needed for up to 20 days for sleep. 03/25/22 04/14/22 Yes Shahmehdi, Valeria Batman, MD  methylPREDNISolone (MEDROL DOSEPAK) 4 MG TBPK tablet Medrol Dosepak take as instructed Patient not taking: Reported on 03/28/2022 03/25/22   Deatra James, MD      Allergies    Penicillins and Levocetirizine    Review of Systems   Review of Systems  Constitutional:  Negative for chills and fever.  Respiratory:  Positive for shortness of breath.   Cardiovascular:  Positive for chest pain.  Gastrointestinal:  Negative for abdominal pain.  Neurological:  Negative for headaches.   Physical Exam Updated Vital Signs BP 125/81   Pulse 74   Temp  (!) 97 F (36.1 C) (Axillary)   Resp 14   Ht 5\' 11"  (1.803 m)   Wt 65.8 kg   SpO2 97%   BMI 20.22 kg/m  Physical Exam Vitals and nursing note reviewed.  Constitutional:      General: He is not in acute distress.    Appearance: He is not ill-appearing.  HENT:     Head: Normocephalic and atraumatic.     Nose: No congestion.  Eyes:     Conjunctiva/sclera: Conjunctivae normal.  Cardiovascular:     Rate and Rhythm: Normal rate and regular rhythm.     Pulses: Normal pulses.     Heart sounds: No murmur heard.   No friction rub. No gallop.  Pulmonary:     Effort: Respiratory distress present.     Breath sounds: Wheezing present. No rhonchi or rales.     Comments: Patient shows  some evidence of pressure distress slightly tachypneic but able to speak in full sentences, lung sounds were coarse sounding, tight, with expiratory wheezing heard in the upper lobes, no rales rhonchi or stridor present.  He is using his at baseline supplemental oxygen of 4 L. Abdominal:     Palpations: Abdomen is soft.     Tenderness: There is no abdominal tenderness. There is no right CVA tenderness or left CVA tenderness.  Musculoskeletal:     Right lower leg: Edema present.     Left lower leg: Edema present.     Comments: Patient has noted 1+ pedal edema without skin changes.  2+ dorsal pedal pulses.  Skin:    General: Skin is warm and dry.  Neurological:     Mental Status: He is alert.  Psychiatric:        Mood and Affect: Mood normal.    ED Results / Procedures / Treatments   Labs (all labs ordered are listed, but only abnormal results are displayed) Labs Reviewed  BASIC METABOLIC PANEL - Abnormal; Notable for the following components:      Result Value   CO2 35 (*)    Glucose, Bld 110 (*)    All other components within normal limits  CBC WITH DIFFERENTIAL/PLATELET - Abnormal; Notable for the following components:   WBC 13.1 (*)    RBC 4.12 (*)    MCV 103.9 (*)    RDW 15.7 (*)    Neutro Abs  11.7 (*)    Lymphs Abs 0.4 (*)    Abs Immature Granulocytes 0.31 (*)    All other components within normal limits  BLOOD GAS, VENOUS - Abnormal; Notable for the following components:   pH, Ven 7.21 (*)    pCO2, Ven 101 (*)    pO2, Ven 57 (*)    Bicarbonate 40.8 (*)    Acid-Base Excess 8.9 (*)    All other components within normal limits  TROPONIN I (HIGH SENSITIVITY) - Abnormal; Notable for the following components:   Troponin I (High Sensitivity) 18 (*)    All other components within normal limits  MRSA NEXT GEN BY PCR, NASAL  BRAIN NATRIURETIC PEPTIDE  PROCALCITONIN  BLOOD GAS, VENOUS  TROPONIN I (HIGH SENSITIVITY)    EKG None  Radiology DG Chest Portable 1 View  Result Date: 03/28/2022 CLINICAL DATA:  SOB EXAM: PORTABLE CHEST 1 VIEW COMPARISON:  Most recent prior chest x-ray 03/21/2022; CT scan of the chest 03/18/2022 FINDINGS: Cardiac and mediastinal contours are within normal limits. Mild atherosclerotic calcifications in the transverse aorta. As before, advanced emphysematous and chronic bronchitic changes present bilaterally. No focal airspace infiltrate. Remote healed fracture of the posterior aspect of the right sixth rib gives the pseudo appearance of a pulmonary nodule. No pleural effusion or pneumothorax. IMPRESSION: Stable chest x-ray with hyperinflation and advanced emphysematous and bronchitic changes suggesting COPD. No acute cardiopulmonary process. Electronically Signed   By: Jacqulynn Cadet M.D.   On: 03/28/2022 10:29    Procedures .Critical Care Performed by: [name], PA-C Authorized by: [name], PA-C   Critical care provider statement:    Critical care time (minutes):  30   Critical care time was exclusive of:  Separately billable procedures and treating other patients   Critical care was time spent personally by me on the following activities:  Development of treatment plan with patient or surrogate, discussions with consultants,  evaluation of patient's response to treatment, examination of patient, ordering and review of laboratory  studies, ordering and review of radiographic studies, ordering and performing treatments and interventions, pulse oximetry, re-evaluation of patient's condition and review of old charts   I assumed direction of critical care for this patient from another provider in my specialty: no     Care discussed with: admitting provider      Medications Ordered in ED Medications  albuterol (PROVENTIL,VENTOLIN) solution continuous neb (10 mg/hr Nebulization Not Given 03/28/22 1122)  Chlorhexidine Gluconate Cloth 2 % PADS 6 each (has no administration in time range)  atorvastatin (LIPITOR) tablet 40 mg (has no administration in time range)  pantoprazole (PROTONIX) EC tablet 40 mg (has no administration in time range)  senna-docusate (Senokot-S) tablet 2 tablet (has no administration in time range)  apixaban (ELIQUIS) tablet 5 mg (has no administration in time range)  simethicone (MYLICON) chewable tablet 80 mg (has no administration in time range)  benztropine (COGENTIN) tablet 0.5 mg (has no administration in time range)  gabapentin (NEURONTIN) capsule 100 mg (has no administration in time range)  dorzolamide (TRUSOPT) 2 % ophthalmic solution 1 drop ( Left Eye Canceled Entry 03/28/22 1531)  latanoprost (XALATAN) 0.005 % ophthalmic solution 1 drop (has no administration in time range)  sodium chloride flush (NS) 0.9 % injection 3 mL (has no administration in time range)  sodium chloride flush (NS) 0.9 % injection 3 mL (has no administration in time range)  0.9 %  sodium chloride infusion (has no administration in time range)  acetaminophen (TYLENOL) tablet 650 mg (has no administration in time range)    Or  acetaminophen (TYLENOL) suppository 650 mg (has no administration in time range)  ondansetron (ZOFRAN) tablet 4 mg (has no administration in time range)    Or  ondansetron (ZOFRAN) injection 4 mg (has  no administration in time range)  methylPREDNISolone sodium succinate (SOLU-MEDROL) 125 mg/2 mL injection 60 mg (has no administration in time range)  budesonide (PULMICORT) nebulizer solution 0.25 mg (has no administration in time range)  ipratropium-albuterol (DUONEB) 0.5-2.5 (3) MG/3ML nebulizer solution 3 mL (3 mLs Nebulization Not Given 03/28/22 1533)  ipratropium-albuterol (DUONEB) 0.5-2.5 (3) MG/3ML nebulizer solution 3 mL (has no administration in time range)  diazepam (VALIUM) tablet 5 mg (5 mg Oral Given 03/28/22 1109)  albuterol (PROVENTIL) (2.5 MG/3ML) 0.083% nebulizer solution (10 mg  Given 03/28/22 1122)  magnesium sulfate IVPB 2 g 50 mL (0 g Intravenous Stopped 03/28/22 1359)  methylPREDNISolone sodium succinate (SOLU-MEDROL) 125 mg/2 mL injection 125 mg (125 mg Intravenous Given 03/28/22 1300)  LORazepam (ATIVAN) injection 0.5 mg (0.5 mg Intravenous Given 03/28/22 1313)    ED Course/ Medical Decision Making/ A&P                           Medical Decision Making Amount and/or Complexity of Data Reviewed Labs: ordered. Radiology: ordered.  Risk Prescription drug management. Decision regarding hospitalization.   This patient presents to the ED for concern of surgical breath chest pain, this involves an extensive number of treatment options, and is a complaint that carries with it a high risk of complications and morbidity.  The differential diagnosis includes ACS, pneumonia, CHF    Additional history obtained:  Additional history obtained from N/A External records from outside source obtained and reviewed including discharge summary, imaging, medications, medical history   Co morbidities that complicate the patient evaluation  Chronic respiratory failure on supplemental oxygen, recent PE, recent lung cancer diagnosis  Social Determinants of Health:  Current tobacco user  Lab Tests:  I Ordered, and personally interpreted labs.  The pertinent results include:  CBC  shows leukocytosis of 13.1, BMP reveals CO2 of 35, glucose 101,Venous blood gas shows pH of 7.21 PCO2 of 101   Imaging Studies ordered:  I ordered imaging studies including chest x-ray I independently visualized and interpreted imaging which showed negative acute findings I agree with the radiologist interpretation   Cardiac Monitoring:  The patient was maintained on a cardiac monitor.  I personally viewed and interpreted the cardiac monitored which showed an underlying rhythm of: EKG without signs of ischemia   Medicines ordered and prescription drug management:  I ordered medication including Valium for back pain, breathing treatment I have reviewed the patients home medicines and have made adjustments as needed  Critical Interventions:  Acute respiratory failure with hypercapnia placed on BiPAP.   Reevaluation:  Presents with complaints of short of breath and chest pain, on exam seems slightly tachypneic, tight sounding chest, will obtain basic lab work-up, provide him with Valium for back pain, breathing treatment for lung tightness and reassess.  Patient is reassessed, appears to be more tachypneic after breathing treatment and he has pursed lip breathing, unable to speak in full sentences, still remaining at 4 L via nasal cannula, patient has very tight sounding lungs, will add on BiPAP, obtain VBG and reassess  Patient was reassessed after BiPAP is resting more constant, labored breathing has improved, will recommend admission for respiratory failure.  He is in agreement with  this plan.    Consultations Obtained:  I requested consultation with the hospitalist Dr. [name],  and discussed lab and imaging findings as well as pertinent plan - they recommend: He will admit the patient.    Test Considered:  We will defer on CT of chest as my suspicion for worsening PE is very low at this time, so missed any doses of his Eliquis, presentation more consistent with COPD  exacerbation.    Rule out I have low suspicion for ACS as history is atypical, EKG was sinus rhythm without signs of ischemia, patient has a downtrending opponent.  Her troponin was initially elevated but this is decreased, suspect initial elevation was positive from acute respiratory distress.  I have low suspicion for pneumonia denies any sputum production, he is afebrile nontachycardic, do not hear any rhonchi on my exam, he does have noted leukocytosis but I suspect this is secondary due to steroid use.  Low suspicion for AAA or aortic dissection as history is atypical, patient has low risk factors.  Low suspicion for systemic infection as patient is nontoxic-appearing, vital signs reassuring, no obvious source infection noted on exam.     Dispostion and problem list  After consideration of the diagnostic results and the patients response to treatment, I feel that the patent would benefit from admission.  Acute respiratory failure with hypercapnia-likely this acute on chronic COPD, patient will need continued monitoring on BiPAP and eventually wean off onto supplemental oxygen.            Final Clinical Impression(s) / ED Diagnoses Final diagnoses:  Acute on chronic respiratory failure with hypercapnia [facility])    Rx / DC Orders ED Discharge Orders     None         [name], PA-C 03/28/22 1538    [name], MD 03/30/22 1554

## 2022-03-28 NOTE — Plan of Care (Signed)

## 2022-03-29 ENCOUNTER — Encounter (HOSPITAL_COMMUNITY): Payer: Self-pay | Admitting: Pulmonary Disease

## 2022-03-29 LAB — CBC
HCT: 34.7 % — ABNORMAL LOW (ref 39.0–52.0)
Hemoglobin: 10.9 g/dL — ABNORMAL LOW (ref 13.0–17.0)
MCH: 32.1 pg (ref 26.0–34.0)
MCHC: 31.4 g/dL (ref 30.0–36.0)
MCV: 102.1 fL — ABNORMAL HIGH (ref 80.0–100.0)
Platelets: 171 10*3/uL (ref 150–400)
RBC: 3.4 MIL/uL — ABNORMAL LOW (ref 4.22–5.81)
RDW: 15.4 % (ref 11.5–15.5)
WBC: 10.8 10*3/uL — ABNORMAL HIGH (ref 4.0–10.5)
nRBC: 0 % (ref 0.0–0.2)

## 2022-03-29 LAB — MAGNESIUM: Magnesium: 2.6 mg/dL — ABNORMAL HIGH (ref 1.7–2.4)

## 2022-03-29 LAB — BLOOD GAS, VENOUS
Acid-Base Excess: 13.7 mmol/L — ABNORMAL HIGH (ref 0.0–2.0)
Bicarbonate: 39.5 mmol/L — ABNORMAL HIGH (ref 20.0–28.0)
Drawn by: 643
FIO2: 35 %
O2 Saturation: 95.3 %
Patient temperature: 36.7
pCO2, Ven: 52 mmHg (ref 44–60)
pH, Ven: 7.48 — ABNORMAL HIGH (ref 7.25–7.43)
pO2, Ven: 66 mmHg — ABNORMAL HIGH (ref 32–45)

## 2022-03-29 LAB — BASIC METABOLIC PANEL
Anion gap: 7 (ref 5–15)
BUN: 20 mg/dL (ref 6–20)
CO2: 35 mmol/L — ABNORMAL HIGH (ref 22–32)
Calcium: 8.6 mg/dL — ABNORMAL LOW (ref 8.9–10.3)
Chloride: 101 mmol/L (ref 98–111)
Creatinine, Ser: 0.77 mg/dL (ref 0.61–1.24)
GFR, Estimated: >60 mL/min (ref >60)
Glucose, Bld: 85 mg/dL (ref 70–99)
Potassium: 3.8 mmol/L (ref 3.5–5.1)
Sodium: 143 mmol/L (ref 135–145)

## 2022-03-29 LAB — MRSA NEXT GEN BY PCR, NASAL: MRSA by PCR Next Gen: NOT DETECTED

## 2022-03-29 LAB — ACID FAST SMEAR (AFB, MYCOBACTERIA): Acid Fast Smear: NEGATIVE

## 2022-03-29 MED ORDER — PREDNISONE 10 MG PO TABS: 40.0000 mg | ORAL_TABLET | Freq: Every day | ORAL | 0 refills | Status: DC

## 2022-03-29 MED ORDER — ALBUTEROL SULFATE HFA 108 (90 BASE) MCG/ACT IN AERS
1.0000 | INHALATION_SPRAY | Freq: Four times a day (QID) | RESPIRATORY_TRACT | Status: DC | PRN
  Administered 2022-03-29: 2 via RESPIRATORY_TRACT
  Filled 2022-03-29: qty 6.7

## 2022-03-29 NOTE — Progress Notes (Signed)
NIV/Trilogy Requirement Statement:- Tony Sullivan has severe COPD with chronic hypoxic and hypercapnic respiratory failure.  Despite aggressive treatment , patient continues to exhibit signs of significant hypercapnia associated with chronic respiratory failure secondary to severe COPD.  Patient requires the use of NIV both nightly and daytime to help with exacerbation periods.   - The use of the NIV will treat patient's high PCO2 levels (please see ABG results on Bipap), and use of NIV can reduce risk of exacerbation in future hospitalizations when used at night and during the day.   Patient will need these advanced settings in conjunction with the current medication regimen and aggressive pulmonary treatment: BiPAP is not an option due to his functional limitations and the severity of the patient's condition.   Failure to have NIV available for use could lead to death.   patient had significant episodes of severe lethargy and unresponsiveness due to very very high CO2 levels   ABG on date 6/1 done on BIPAP with Fi02 of 40% Arterial Blood Gas result:  pO2 57; pCO2 -101 pH 7.21,  O2 Sat 84 %.

## 2022-03-29 NOTE — Progress Notes (Signed)
Discharge instructions reviewed with patient.  Patient verbalized understanding of instructions. Patient discharged home via wheelchair and home oxygen tank in stable condition.

## 2022-03-29 NOTE — Discharge Summary (Signed)
Physician Discharge Summary  Tony Sullivan XTK:[id number] DOB: Mar 25, 1961 DOA: 03/28/2022  PCP: [name], FNP  Admit date: 03/28/2022  Discharge date: 03/29/2022  Admitted From:Home  Disposition:  Home  Recommendations for Outpatient Follow-up:  Follow up with PCP in 1-2 weeks Follow-up with oncology outpatient with referral sent regarding squamous cell carcinoma of the lung Remain on prednisone as prescribed for 5 more days Breathing treatments as needed at home Patient set up with noninvasive ventilator which will hopefully reduce readmission rate Follow-up with outpatient palliative care  Home Health: None  Equipment/Devices: Home noninvasive ventilator, has chronic 4 L nasal cannula oxygen  Discharge Condition:Stable  CODE STATUS: Full  Diet recommendation: Heart Healthy  Brief/Interim Summary: Tony Sullivan is a 61 y.o. male with medical history significant for COPD with chronic respiratory failure on 3-4L , dyslipidemia, hypertension, GERD, tobacco abuse who presented to the ED with complaints of chest pain and shortness of breath. He was recently admitted on 5/24 and discharged on 5/29 for treatment of acute on chronic hypoxemic respiratory failure secondary to COPD exacerbation and was also noted to have a left upper lobe endobronchial mass with hemoptysis that resulted in bronchoscopy on 5/26.  Prior to this, he was discharged on 5/19 with possible aspiration pneumonia and was noted to have a PE.  He was admitted this time with suspicion of acute, recurrent COPD exacerbation with hypercapnic respiratory failure as noted on ABG.  His condition quickly improved with the use of BiPAP as well as steroids and breathing treatments.  Given his recurrence of hospitalizations he was seen by pulmonology with recommendation to have noninvasive ventilator set up at home and follow-up with outpatient palliative care.  Both of these have been set up and hopefully this will reduce  his readmission rate in the near future, although he continues to remain a high risk for readmission at this time.  His bronchoscopy results have returned demonstrating squamous cell lung cancer and referral has been sent to oncology to set up an appointment for further follow-up and outpatient PET scan.  Discharge Diagnoses:  Principal Problem:   Acute exacerbation of chronic obstructive pulmonary disease (COPD) ([place]) Active Problems:   Leukocytosis   Chronic respiratory failure with hypoxia (HCC)   Essential hypertension   Atypical chest pain   GERD (gastroesophageal reflux disease)   Acute respiratory failure with hypoxia and hypercapnia (HCC)   Cigarette smoker   Acute on chronic respiratory failure with hypercapnia (HCC)   Bipolar disorder (HCC)   Mixed hyperlipidemia   Physical deconditioning   Thoracic compression fracture (HCC)   Mass of upper lobe of left lung  Principal discharge diagnosis: Acute hypercapnic respiratory failure secondary to acute COPD exacerbation-recurrent.  New diagnosis of squamous cell lung cancer.  Discharge Instructions  Discharge Instructions     Diet - low sodium heart healthy   Complete by: As directed    Increase activity slowly   Complete by: As directed       Allergies as of 03/29/2022       Reactions   Penicillins Anaphylaxis   Has patient had a PCN reaction causing immediate rash, facial/tongue/throat swelling, SOB or lightheadedness with hypotension: Yes Has patient had a PCN reaction causing severe rash involving mucus membranes or skin necrosis: No Has patient had a PCN reaction that required hospitalization No Has patient had a PCN reaction occurring within the last 10 years: No If all of the above answers are "NO", then may proceed with Cephalosporin use.  Levocetirizine Other (See Comments)   Muscle cramps        Medication List     STOP taking these medications    doxycycline 100 MG tablet Commonly known as:  VIBRA-TABS   methylPREDNISolone 4 MG Tbpk tablet Commonly known as: MEDROL DOSEPAK       TAKE these medications    acetaminophen 325 MG tablet Commonly known as: TYLENOL Take 2 tablets (650 mg total) by mouth every 6 (six) hours as needed for mild pain (or Fever >/= 101).   albuterol (2.5 MG/3ML) 0.083% nebulizer solution Commonly known as: PROVENTIL Take 3 mLs (2.5 mg total) by nebulization every 4 (four) hours as needed for wheezing or shortness of breath.   albuterol 108 (90 Base) MCG/ACT inhaler Commonly known as: VENTOLIN HFA Inhale 2 puffs into the lungs every 6 (six) hours as needed for wheezing or shortness of breath.   ALPRAZolam 0.5 MG tablet Commonly known as: XANAX Take 1 tablet (0.5 mg total) by mouth 3 (three) times daily as needed for up to 5 days for anxiety.   apixaban 5 MG Tabs tablet Commonly known as: ELIQUIS Take 1 tablet (5 mg total) by mouth 2 (two) times daily.   atorvastatin 40 MG tablet Commonly known as: LIPITOR TAKE ONE TABLET BY MOUTH DAILY   benztropine 0.5 MG tablet Commonly known as: COGENTIN Take 0.5 mg by mouth at bedtime.   Breztri Aerosphere 160-9-4.8 MCG/ACT Aero Generic drug: Budeson-Glycopyrrol-Formoterol Inhale 2 puffs into the lungs in the morning and at bedtime.   diclofenac Sodium 1 % Gel Commonly known as: VOLTAREN Apply 2 g topically 4 (four) times daily as needed (pain).   dorzolamide 2 % ophthalmic solution Commonly known as: TRUSOPT Place 1 drop into the left eye 2 (two) times daily.   gabapentin 100 MG capsule Commonly known as: NEURONTIN Take 1 capsule (100 mg total) by mouth 3 (three) times daily.   guaiFENesin 200 MG tablet Take 2 tablets (400 mg total) by mouth every 6 (six) hours as needed for cough or to loosen phlegm.   latanoprost 0.005 % ophthalmic solution Commonly known as: XALATAN Place 1 drop into both eyes at bedtime.   loxapine 10 MG capsule Commonly known as: LOXITANE Take 10 mg by mouth at  bedtime.   mirtazapine 30 MG tablet Commonly known as: REMERON Take 1 tablet (30 mg total) by mouth at bedtime.   multivitamin with minerals Tabs tablet Take 1 tablet by mouth daily.   OXYGEN Inhale 3-4 L into the lungs as directed. 3-4 liters with sleep and exertion as needed for low oxygen   pantoprazole 40 MG tablet Commonly known as: PROTONIX Take 1 tablet (40 mg total) by mouth daily.   polyethylene glycol 17 g packet Commonly known as: MIRALAX / GLYCOLAX TAKE 17 GRAMS BY MOUTH DAILY What changed: See the new instructions.   predniSONE 10 MG tablet Commonly known as: DELTASONE Take 4 tablets (40 mg total) by mouth daily for 5 days.   senna-docusate 8.6-50 MG tablet Commonly known as: Senokot-S Take 2 tablets by mouth at bedtime. What changed:  when to take this reasons to take this   simethicone 80 MG chewable tablet Commonly known as: MYLICON Chew 1 tablet (80 mg total) by mouth 4 (four) times daily as needed for flatulence.   zolpidem 5 MG tablet Commonly known as: AMBIEN Take 1 tablet (5 mg total) by mouth at bedtime as needed for up to 20 days for sleep.  Follow-up Information     [name], FNP. Schedule an appointment as soon as possible for a visit in 1 week(s).   Specialty: Nurse Practitioner Contact information: [address] [place]  [address] [phone]         Derek Jack, MD. Go to.   Specialty: Hematology Contact information: [place] [address] [phone]                Allergies  Allergen Reactions   Penicillins Anaphylaxis    Has patient had a PCN reaction causing immediate rash, facial/tongue/throat swelling, SOB or lightheadedness with hypotension: Yes Has patient had a PCN reaction causing severe rash involving mucus membranes or skin necrosis: No Has patient had a PCN reaction that required hospitalization No Has patient had a PCN reaction occurring  within the last 10 years: No If all of the above answers are "NO", then may proceed with Cephalosporin use.    Levocetirizine Other (See Comments)    Muscle cramps    Consultations: Pulmonology   Procedures/Studies: DG Chest 1 View  Result Date: 03/18/2022 CLINICAL DATA:  Chest and abdominal pain. EXAM: CHEST  1 VIEW COMPARISON:  CT chest dated Mar 13, 2022 FINDINGS: The heart size and mediastinal contours are within normal limits. Emphysematous changes of the lungs. Focal consolidation or appreciable pleural effusion. Callus formation of the prior right posterior sixth rib fracture. IMPRESSION: 1. Emphysematous changes without evidence of acute cardiopulmonary process. 2.  Callus formation of the prior right sixth rib fracture. Electronically Signed   By: Keane Police D.O.   On: 03/18/2022 17:05   DG Chest 2 View  Result Date: 03/06/2022 CLINICAL DATA:  Chest pain. EXAM: CHEST - 2 VIEW COMPARISON:  Chest x-ray May 3, 23. FINDINGS: No consolidation. No visible pleural effusions or pneumothorax. Hyperinflation. Remote right rib fracture bony callus. IMPRESSION: Hyperinflation without acute abnormality. Electronically Signed   By: Margaretha Sheffield M.D.   On: 03/06/2022 14:09   CT Chest W Contrast  Result Date: 03/19/2022 CLINICAL DATA:  Respiratory illness, nondiagnostic xray. Chest pain. EXAM: CT CHEST WITH CONTRAST TECHNIQUE: Multidetector CT imaging of the chest was performed during intravenous contrast administration. RADIATION DOSE REDUCTION: This exam was performed according to the departmental dose-optimization program which includes automated exposure control, adjustment of the mA and/or kV according to patient size and/or use of iterative reconstruction technique. CONTRAST:  80mL OMNIPAQUE IOHEXOL 300 MG/ML  SOLN COMPARISON:  03/13/2022 FINDINGS: Cardiovascular: Moderate multi-vessel coronary artery calcification. Global cardiac size within normal limits. No pericardial effusion.  Central pulmonary arteries are enlarged in keeping with changes of pulmonary arterial hypertension. Mild atherosclerotic calcification within the thoracic aorta. No aortic aneurysm. Mediastinum/Nodes: No enlarged mediastinal, hilar, or axillary lymph nodes. Thyroid gland, trachea, and esophagus demonstrate no significant findings. Lungs/Pleura: Severe emphysema. Superimposed airway inflammation again noted with bronchial wall thickening. Airway impaction within the lower lobes has improved in the interval since prior examination. Endobronchial mass is again identified within the left upper lobe are pulmonary bronchus extending peripherally into multiple segmental and subsegmental bronchi of the left upper lobe. No pneumothorax or pleural effusion. Upper Abdomen: No acute abnormality. Musculoskeletal: Stable mild compression deformities of T5, T8, T9-T10, T11, and L1. No acute bone abnormality. IMPRESSION: Severe emphysema. Superimposed airway inflammation. Improved airway impaction within the lower lobes bilaterally. 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 Correlation with endoscopy is recommended. Moderate multi-vessel coronary  artery calcification. Morphologic changes in keeping with pulmonary arterial hypertension. Numerous thoracic compression deformities, stable since prior examination. Aortic Atherosclerosis (ICD10-I70.0) and Emphysema (ICD10-J43.9). Electronically Signed   By: Fidela Salisbury M.D.   On: 03/19/2022 00:18   CT Angio Chest Pulmonary Embolism (PE) W or WO Contrast  Addendum Date: 03/13/2022   ADDENDUM REPORT: 03/13/2022 20:03 ADDENDUM: Upon further review, there is a small nonocclusive hypodense acute thrombus in the posterior right upper lobe segmental main artery on 4:41, partially occlusive thrombus in an adjacent apically directed first order segmental artery best seen on series 8 coronal  reconstruction image 63 and 4: 38, with nonocclusive extension of thrombus a short distance into 2 of its subsegmental branch arteries on 4:37. This is a small embolic burden with no findings of associated acute right heart strain. Electronically Signed   By: Telford Nab M.D.   On: 03/13/2022 20:03   Result Date: 03/13/2022 CLINICAL DATA:  Chest tightness and shortness of breath. EXAM: CT ANGIOGRAPHY CHEST WITH CONTRAST TECHNIQUE: Multidetector CT imaging of the chest was performed using the standard protocol during bolus administration of intravenous contrast. Multiplanar CT image reconstructions and MIPs were obtained to evaluate the vascular anatomy. RADIATION DOSE REDUCTION: This exam was performed according to the departmental dose-optimization program which includes automated exposure control, adjustment of the mA and/or kV according to patient size and/or use of iterative reconstruction technique. CONTRAST:  147mL OMNIPAQUE IOHEXOL 350 MG/ML SOLN COMPARISON:  Portable chest 03/11/2022 portable chest today, CTA chest 02/14/2022, CTA chest 05/12/2018. FINDINGS: Cardiovascular: The cardiac size is normal. There is scattered calcification in the coronary arteries. There is no pericardial effusion. The great vessels are patent with normal variant brachiobicarotid trunk. The ascending aorta is borderline aneurysmal measuring 3.9 cm. The remainder is within normal caliber limits. The pulmonary veins are decompressed. Prominent but unchanged pulmonary trunk again measuring 3.3 cm indicating arterial hypertension. The pulmonary arteries are free of thrombus at least through the segmental arteries with the subsegmental arterial bed for the most part obscured by breathing motion. Mediastinum/Nodes: No enlarged mediastinal, hilar, or axillary lymph nodes. Thyroid gland, trachea, and esophagus demonstrate no significant findings. Lungs/Pleura: Advanced centrilobular/panlobular emphysema predominating in the upper  lobes. Are scattered linear scar-like opacities. There is again noted airway obstruction/impaction involving the left upper lobe apicoposterior segmental main bronchus with mucous plugging or debris continuing cephalad into several subsegmental bronchi, as before. There is increased right lower lobe posterior basal subsegmental bronchial impaction on series 6 axial 97-107. Increased posterior basal segmental and subsegmental left lower lobe bronchial impactions are also noted with increased layering fluid in the left main bronchus. There is increased subpleural linear atelectasis in the posterior basal lower lobes compared to the last CT but no appreciable infiltrates. There is no pleural effusion, thickening or pneumothorax. Upper Abdomen: No acute findings. Musculoskeletal: Osteopenia. 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 There is a new moderate compression fracture of the T5 vertebral body with trace posterosuperior cortical retropulsion, 50% anterior and 40% posterior vertebral height loss, and a mild upper plate anterior wedge compression fracture of the T8 vertebral body without retropulsion, with 30% anterior and 25% posterior vertebral height loss. Again noted are there mild chronic compression fractures of T10, T11 and L1. Review of the MIP images confirms the above findings. IMPRESSION: 1. Negative for visible pulmonary embolism with the subsegmental arterial bed largely not evaluated to breathing motion. 2. Segmental/subsegmental airway opacification left upper lobe which was seen previously.  Obstructing soft tissue mass is not excluded. Bronchoscopy recommended. 3. Increased fluid/debris in the lower lobe posterior basal segmental/subsegmental bronchi. Consider aspiration precautions. There is increased fluid in the left main bronchus. 4. Increased atelectasis in the posterior lung bases but no visible infiltrates. Severe COPD change. 5. Compression fractures of  the T5 and 8 vertebral bodies new from 02/14/2022 but acuity indeterminate. Osteopenia and chronic compression fractures. 6. Borderline aneurysmal ascending aorta. No aortic dissection or other significant ectasia. 7. Aortic and coronary artery atherosclerosis. Electronically Signed: By: Telford Nab M.D. On: 03/13/2022 04:57   US Venous Img Lower Bilateral (DVT)  Result Date: 03/14/2022 CLINICAL DATA:  Bilateral lower extremity pain for the past 5 years EXAM: BILATERAL LOWER EXTREMITY VENOUS DOPPLER ULTRASOUND TECHNIQUE: Gray-scale sonography with graded compression, as well as color Doppler and duplex ultrasound were performed to evaluate the lower extremity deep venous systems from the level of the common femoral vein and including the common femoral, femoral, profunda femoral, popliteal and calf veins including the posterior tibial, peroneal and gastrocnemius veins when visible. The superficial great saphenous vein was also interrogated. Spectral Doppler was utilized to evaluate flow at rest and with distal augmentation maneuvers in the common femoral, femoral and popliteal veins. COMPARISON:  None Available. FINDINGS: RIGHT LOWER EXTREMITY Common Femoral Vein: No evidence of thrombus. Normal compressibility, respiratory phasicity and response to augmentation. Saphenofemoral Junction: No evidence of thrombus. Normal compressibility and flow on color Doppler imaging. Profunda Femoral Vein: No evidence of thrombus. Normal compressibility and flow on color Doppler imaging. Femoral Vein: No evidence of thrombus. Normal compressibility, respiratory phasicity and response to augmentation. Popliteal Vein: No evidence of thrombus. Normal compressibility, respiratory phasicity and response to augmentation. Calf Veins: No evidence of thrombus. Normal compressibility and flow on color Doppler imaging. Superficial Great Saphenous Vein: No evidence of thrombus. Normal compressibility. Venous Reflux:  None. Other  Findings:  None. LEFT LOWER EXTREMITY Common Femoral Vein: No evidence of thrombus. Normal compressibility, respiratory phasicity and response to augmentation. Saphenofemoral Junction: No evidence of thrombus. Normal compressibility and flow on color Doppler imaging. Profunda Femoral Vein: No evidence of thrombus. Normal compressibility and flow on color Doppler imaging. Femoral Vein: No evidence of thrombus. Normal compressibility, respiratory phasicity and response to augmentation. Popliteal Vein: No evidence of thrombus. Normal compressibility, respiratory phasicity and response to augmentation. Calf Veins: No evidence of thrombus. Normal compressibility and flow on color Doppler imaging. Superficial Great Saphenous Vein: No evidence of thrombus. Normal compressibility. Venous Reflux:  None. Other Findings:  None. IMPRESSION: No evidence of deep venous thrombosis in either lower extremity. Electronically Signed   By: Jacqulynn Cadet M.D.   On: 03/14/2022 14:18   DG Chest Portable 1 View  Result Date: 03/28/2022 CLINICAL DATA:  SOB EXAM: PORTABLE CHEST 1 VIEW COMPARISON:  Most recent prior chest x-ray 03/21/2022; CT scan of the chest 03/18/2022 FINDINGS: Cardiac and mediastinal contours are within normal limits. Mild atherosclerotic calcifications in the transverse aorta. As before, advanced emphysematous and chronic bronchitic changes present bilaterally. No focal airspace infiltrate. Remote healed fracture of the posterior aspect of the right sixth rib gives the pseudo appearance of a pulmonary nodule. No pleural effusion or pneumothorax. IMPRESSION: Stable chest x-ray with hyperinflation and advanced emphysematous and bronchitic changes suggesting COPD. No acute cardiopulmonary process. Electronically Signed   By: Jacqulynn Cadet M.D.   On: 03/28/2022 10:29   DG CHEST PORT 1 VIEW  Result Date: 03/23/2022 CLINICAL DATA:  Shortness of breath starting this morning. EXAM: PORTABLE  CHEST 1 VIEW  COMPARISON:  03/21/2022 and chest CT dated 03/18/2022. FINDINGS: The previously demonstrated healing callus associated with a fracture of the right posterior 6th rib is unchanged. The previously demonstrated 2nd nodular density more superiorly is no longer seen. Currently, the lungs are clear and remain hyperexpanded. Normal sized heart. Mildly tortuous and calcified thoracic aorta. IMPRESSION: 1. Resolved nodular density in the right upper lung zone. 2. Stable changes of COPD. Electronically Signed   By: Claudie Revering M.D.   On: 03/23/2022 10:00   DG CHEST PORT 1 VIEW  Result Date: 03/21/2022 CLINICAL DATA:  Short of breath and weakness EXAM: PORTABLE CHEST 1 VIEW COMPARISON:  03/19/2022 FINDINGS: COPD and emphysema. Negative for heart failure or edema. No infiltrate or effusion Nodular density in the right upper lobe corresponds to healing rib fracture as noted on CT. Second nodule superior to the rib callus is also noted and not seen on the prior chest x-ray. No nodule seen in the lungs on the prior CT of 03/18/2022. IMPRESSION: COPD and emphysema.  No acute infiltrate or effusion. Electronically Signed   By: Franchot Gallo M.D.   On: 03/21/2022 11:37   DG Chest Port 1 View  Result Date: 03/19/2022 CLINICAL DATA:  Hemoptysis EXAM: PORTABLE CHEST 1 VIEW COMPARISON:  03/18/2022, CT 03/18/2022 FINDINGS: Emphysema and bronchitic changes. No consolidation, pleural effusion, or pneumothorax. Prominent callus at the right sixth rib fracture accounts for right lower mid lung nodular opacity. Stable cardiomediastinal silhouette. IMPRESSION: Emphysema and bronchitic changes without acute airspace disease Electronically Signed   By: Donavan Foil M.D.   On: 03/19/2022 20:05   DG Chest Port 1 View  Result Date: 03/13/2022 CLINICAL DATA:  Chest pain, shortness of breath EXAM: PORTABLE CHEST 1 VIEW COMPARISON:  01/09/2022 FINDINGS: Mild patchy/nodular right upper lobe opacities, raising the possibility of mild  pneumonia. Left lung is essentially clear. No pleural effusion or pneumothorax. The heart is normal in size. IMPRESSION: Mild patchy/nodular right upper lobe opacities, raising the possibility of mild pneumonia. Electronically Signed   By: Julian Hy M.D.   On: 03/13/2022 03:19   DG Chest Portable 1 View  Result Date: 03/11/2022 CLINICAL DATA:  Shortness of breath EXAM: PORTABLE CHEST 1 VIEW COMPARISON:  Five days ago FINDINGS: Hyperinflation and architectural distortion from emphysema. Nodular density over the right chest is from rib callus by April 2023 CT. There is no edema, consolidation, effusion, or pneumothorax. Normal heart size and mediastinal contours. IMPRESSION: COPD without acute superimposed finding. Electronically Signed   By: Jorje Guild M.D.   On: 03/11/2022 05:22   ECHOCARDIOGRAM COMPLETE  Result Date: 02/28/2022    ECHOCARDIOGRAM REPORT   Patient Name:   BUNNIE LEDERMAN Teicher Date of Exam: 02/28/2022 Medical Rec #:  [id number]      Height:       71.0 in Accession #:    [id number]     Weight:       140.7 lb Date of Birth:  Feb 10, 1961       BSA:          1.816 m Patient Age:    53 years       BP:           142/79 mmHg Patient Gender: M              HR:           75 bpm. Exam Location:  [facility] Procedure: 2D Echo, Cardiac Doppler, Color Doppler and Strain  Analysis Indications:    Chest Pain  History:        Patient has prior history of Echocardiogram examinations, most                 recent 10/27/2021. Risk Factors:Hypertension, Current Smoker and                 Dyslipidemia. Elevated troponin, Bipolar.  Sonographer:    Leavy Cella RDCS Referring Phys: [phone] DAVID TAT IMPRESSIONS  1. Left ventricular ejection fraction, by estimation, is 60 to 65%. The left ventricle has normal function. The left ventricle has no regional wall motion abnormalities. Left ventricular diastolic parameters are consistent with Grade I diastolic dysfunction (impaired relaxation).  2. Right ventricular  systolic function is normal. The right ventricular size is normal. Tricuspid regurgitation signal is inadequate for assessing PA pressure.  3. The mitral valve is normal in structure. No evidence of mitral valve regurgitation. No evidence of mitral stenosis.  4. The aortic valve was not well visualized. Aortic valve regurgitation is not visualized. No aortic stenosis is present.  5. The inferior vena cava is normal in size with greater than 50% respiratory variability, suggesting right atrial pressure of 3 mmHg. FINDINGS  Left Ventricle: Left ventricular ejection fraction, by estimation, is 60 to 65%. The left ventricle has normal function. The left ventricle has no regional wall motion abnormalities. Global longitudinal strain performed but not reported based on interpreter judgement due to suboptimal tracking. The left ventricular internal cavity size was normal in size. There is no left ventricular hypertrophy. Left ventricular diastolic parameters are consistent with Grade I diastolic dysfunction (impaired relaxation). Normal left ventricular filling pressure. Right Ventricle: The right ventricular size is normal. No increase in right ventricular wall thickness. Right ventricular systolic function is normal. Tricuspid regurgitation signal is inadequate for assessing PA pressure. Left Atrium: Left atrial size was normal in size. Right Atrium: Right atrial size was normal in size. Pericardium: There is no evidence of pericardial effusion. Mitral Valve: The mitral valve is normal in structure. No evidence of mitral valve regurgitation. No evidence of mitral valve stenosis. Tricuspid Valve: The tricuspid valve is normal in structure. Tricuspid valve regurgitation is not demonstrated. No evidence of tricuspid stenosis. Aortic Valve: The aortic valve was not well visualized. Aortic valve regurgitation is not visualized. No aortic stenosis is present. Aortic valve mean gradient measures 5.0 mmHg. Aortic valve peak  gradient measures 10.2 mmHg. Aortic valve area, by VTI measures 1.85 cm. Pulmonic Valve: The pulmonic valve was not well visualized. Pulmonic valve regurgitation is not visualized. No evidence of pulmonic stenosis. Aorta: The aortic root is normal in size and structure. Venous: The inferior vena cava is normal in size with greater than 50% respiratory variability, suggesting right atrial pressure of 3 mmHg. IAS/Shunts: No atrial level shunt detected by color flow Doppler.  LEFT VENTRICLE PLAX 2D LVIDd:         4.90 cm   Diastology LVIDs:         3.00 cm   LV e' medial:    7.72 cm/s LV PW:         1.00 cm   LV E/e' medial:  11.8 LV IVS:        0.90 cm   LV e' lateral:   7.40 cm/s LVOT diam:     1.90 cm   LV E/e' lateral: 12.3 LV SV:         55 LV SV Index:   30 LVOT Area:  2.84 cm                           3D Volume EF:                          3D EF:        54 %                          LV EDV:       139 ml                          LV ESV:       64 ml                          LV SV:        75 ml RIGHT VENTRICLE RV Basal diam:  4.60 cm RV Mid diam:    3.30 cm RV S prime:     12.40 cm/s TAPSE (M-mode): 2.3 cm LEFT ATRIUM             Index        RIGHT ATRIUM          Index LA diam:        3.20 cm 1.76 cm/m   RA Area:     7.58 cm LA Vol (A2C):   38.7 ml 21.32 ml/m  RA Volume:   13.20 ml 7.27 ml/m LA Vol (A4C):   25.5 ml 14.05 ml/m LA Biplane Vol: 32.3 ml 17.79 ml/m  AORTIC VALVE AV Area (Vmax):    2.04 cm AV Area (Vmean):   1.95 cm AV Area (VTI):     1.85 cm AV Vmax:           160.00 cm/s AV Vmean:          106.000 cm/s AV VTI:            0.297 m AV Peak Grad:      10.2 mmHg AV Mean Grad:      5.0 mmHg LVOT Vmax:         115.00 cm/s LVOT Vmean:        72.800 cm/s LVOT VTI:          0.194 m LVOT/AV VTI ratio: 0.65  AORTA Ao Root diam: 2.90 cm Ao Asc diam:  2.90 cm MITRAL VALVE MV Area (PHT): 2.83 cm     SHUNTS MV Decel Time: 268 msec     Systemic VTI:  0.19 m MV E velocity: 91.18 cm/s   Systemic Diam:  1.90 cm MV A velocity: 121.00 cm/s MV E/A ratio:  0.75 Carlyle Dolly MD Electronically signed by Carlyle Dolly MD Signature Date/Time: 02/28/2022/4:29:59 PM    Final      Discharge Exam: Vitals:   03/29/22 0811 03/29/22 1152  BP:    Pulse:    Resp:    Temp:  97.9 F (36.6 C)  SpO2: 100%    Vitals:   03/29/22 0700 03/29/22 0809 03/29/22 0811 03/29/22 1152  BP:      Pulse:      Resp:      Temp: 98 F (36.7 C)   97.9 F (36.6 C)  TempSrc: Oral   Oral  SpO2:  98% 100%   Weight:      Height:  General: Pt is alert, awake, not in acute distress Cardiovascular: RRR, S1/S2 +, no rubs, no gallops Respiratory: CTA bilaterally, no wheezing, no rhonchi, on 4 L nasal cannula oxygen Abdominal: Soft, NT, ND, bowel sounds + Extremities: no edema, no cyanosis    The results of significant diagnostics from this hospitalization (including imaging, microbiology, ancillary and laboratory) are listed below for reference.     Microbiology: Recent Results (from the past 240 hour(s))  Resp Panel by RT-PCR (Flu A&B, Covid) Nasopharyngeal Swab     Status: None   Collection Time: 03/19/22 11:30 PM   Specimen: Nasopharyngeal Swab; Nasopharyngeal(NP) swabs in vial transport medium  Result Value Ref Range Status   SARS Coronavirus 2 by RT PCR NEGATIVE NEGATIVE Final    Comment: (NOTE) SARS-CoV-2 target nucleic acids are NOT DETECTED.  The SARS-CoV-2 RNA is generally detectable in upper respiratory specimens during the acute phase of infection. The lowest concentration of SARS-CoV-2 viral copies this assay can detect is 138 copies/mL. A negative result does not preclude SARS-Cov-2 infection and should not be used as the sole basis for treatment or other patient management decisions. A negative result may occur with  improper specimen collection/handling, submission of specimen other than nasopharyngeal swab, presence of viral mutation(s) within the areas targeted by this assay, and  inadequate number of viral copies(<138 copies/mL). A negative result must be combined with clinical observations, patient history, and epidemiological information. The expected result is Negative.  Fact Sheet for Patients:  EntrepreneurPulse.com.au  Fact Sheet for Healthcare Providers:  IncredibleEmployment.be  This test is no t yet approved or cleared by the [place] FDA and  has been authorized for detection and/or diagnosis of SARS-CoV-2 by FDA under an Emergency Use Authorization (EUA). This EUA will remain  in effect (meaning this test can be used) for the duration of the COVID-19 declaration under Section 564(b)(1) of the Act, 21 U.S.C.section 360bbb-3(b)(1), unless the authorization is terminated  or revoked sooner.       Influenza A by PCR NEGATIVE NEGATIVE Final   Influenza B by PCR NEGATIVE NEGATIVE Final    Comment: (NOTE) The Xpert Xpress SARS-CoV-2/FLU/RSV plus assay is intended as an aid in the diagnosis of influenza from Nasopharyngeal swab specimens and should not be used as a sole basis for treatment. Nasal washings and aspirates are unacceptable for Xpert Xpress SARS-CoV-2/FLU/RSV testing.  Fact Sheet for Patients: EntrepreneurPulse.com.au  Fact Sheet for Healthcare Providers: IncredibleEmployment.be  This test is not yet approved or cleared by the [place] FDA and has been authorized for detection and/or diagnosis of SARS-CoV-2 by FDA under an Emergency Use Authorization (EUA). This EUA will remain in effect (meaning this test can be used) for the duration of the COVID-19 declaration under Section 564(b)(1) of the Act, 21 U.S.C. section 360bbb-3(b)(1), unless the authorization is terminated or revoked.  Performed at [facility], [address], [place], [place] [address]   Culture, Respiratory w Gram Stain     Status: None   Collection Time: 03/22/22  2:01 PM   Specimen:  Bronchial Alveolar Lavage; Respiratory  Result Value Ref Range Status   Specimen Description   Final    BRONCHIAL ALVEOLAR LAVAGE Performed at [facility], [address], [place], [place] [address]    Special Requests   Final    LEFT UPPER LUNG Performed at [facility], [address], [place], [place] [address]    Gram Stain   Final    FEW WBC PRESENT, PREDOMINANTLY PMN RARE Lonell Grandchild  POSITIVE COCCI    Culture   Final    RARE Normal respiratory flora-no Staph aureus or Pseudomonas seen Performed at [facility], [place] [address] [place], [place] [address]    Report Status 03/25/2022 FINAL  Final  MRSA Next Gen by PCR, Nasal     Status: None   Collection Time: 03/29/22  4:30 AM   Specimen: Nasal Mucosa; Nasal Swab  Result Value Ref Range Status   MRSA by PCR Next Gen NOT DETECTED NOT DETECTED Final    Comment: (NOTE) The GeneXpert MRSA Assay (FDA approved for NASAL specimens only), is one component of a comprehensive MRSA colonization surveillance program. It is not intended to diagnose MRSA infection nor to guide or monitor treatment for MRSA infections. Test performance is not FDA approved in patients less than 62 years old. Performed at [facility], [address], [place],  [address]      Labs: BNP (last 3 results) Recent Labs    03/13/22 0312 03/23/22 0846 03/28/22 1016  BNP 48.0 47.0 02.5   Basic Metabolic Panel: Recent Labs  Lab 03/23/22 0846 03/28/22 1016 03/29/22 0427  NA 141 144 143  K 3.8 4.3 3.8  CL 95* 100 101  CO2 39* 35* 35*  GLUCOSE 81 110* 85  BUN 19 19 20   CREATININE 0.73 0.84 0.77  CALCIUM 8.5* 9.2 8.6*  MG  --   --  2.6*   Liver Function Tests: No results for input(s): AST, ALT, ALKPHOS, BILITOT, PROT, ALBUMIN in the last 168 hours. No results for input(s): LIPASE, AMYLASE in the last 168 hours. No results for input(s): AMMONIA in the last 168 hours. CBC: Recent Labs  Lab 03/28/22 1016 03/29/22 0427  WBC 13.1* 10.8*   NEUTROABS 11.7*  --   HGB 13.1 10.9*  HCT 42.8 34.7*  MCV 103.9* 102.1*  PLT 172 171   Cardiac Enzymes: No results for input(s): CKTOTAL, CKMB, CKMBINDEX, TROPONINI in the last 168 hours. BNP: Invalid input(s): POCBNP CBG: No results for input(s): GLUCAP in the last 168 hours. D-Dimer No results for input(s): DDIMER in the last 72 hours. Hgb A1c No results for input(s): HGBA1C in the last 72 hours. Lipid Profile No results for input(s): CHOL, HDL, LDLCALC, TRIG, CHOLHDL, LDLDIRECT in the last 72 hours. Thyroid function studies No results for input(s): TSH, T4TOTAL, T3FREE, THYROIDAB in the last 72 hours.  Invalid input(s): FREET3 Anemia work up No results for input(s): VITAMINB12, FOLATE, FERRITIN, TIBC, IRON, RETICCTPCT in the last 72 hours. Urinalysis    Component Value Date/Time   COLORURINE YELLOW 10/16/2021 0130   APPEARANCEUR CLEAR 10/16/2021 0130   LABSPEC 1.020 10/16/2021 0130   PHURINE 6.0 10/16/2021 0130   GLUCOSEU NEGATIVE 10/16/2021 0130   HGBUR TRACE (A) 10/16/2021 0130   BILIRUBINUR NEGATIVE 10/16/2021 0130   KETONESUR NEGATIVE 10/16/2021 0130   PROTEINUR NEGATIVE 10/16/2021 0130   UROBILINOGEN 1.0 06/12/2015 1021   NITRITE NEGATIVE 10/16/2021 0130   LEUKOCYTESUR NEGATIVE 10/16/2021 0130   Sepsis Labs Invalid input(s): PROCALCITONIN,  WBC,  LACTICIDVEN Microbiology Recent Results (from the past 240 hour(s))  Resp Panel by RT-PCR (Flu A&B, Covid) Nasopharyngeal Swab     Status: None   Collection Time: 03/19/22 11:30 PM   Specimen: Nasopharyngeal Swab; Nasopharyngeal(NP) swabs in vial transport medium  Result Value Ref Range Status   SARS Coronavirus 2 by RT PCR NEGATIVE NEGATIVE Final    Comment: (NOTE) SARS-CoV-2 target nucleic acids are NOT DETECTED.  The SARS-CoV-2 RNA is generally  detectable in upper respiratory specimens during the acute phase of infection. The lowest concentration of SARS-CoV-2 viral copies this assay can detect is 138  copies/mL. A negative result does not preclude SARS-Cov-2 infection and should not be used as the sole basis for treatment or other patient management decisions. A negative result may occur with  improper specimen collection/handling, submission of specimen other than nasopharyngeal swab, presence of viral mutation(s) within the areas targeted by this assay, and inadequate number of viral copies(<138 copies/mL). A negative result must be combined with clinical observations, patient history, and epidemiological information. The expected result is Negative.  Fact Sheet for Patients:  EntrepreneurPulse.com.au  Fact Sheet for Healthcare Providers:  IncredibleEmployment.be  This test is no t yet approved or cleared by the [place] FDA and  has been authorized for detection and/or diagnosis of SARS-CoV-2 by FDA under an Emergency Use Authorization (EUA). This EUA will remain  in effect (meaning this test can be used) for the duration of the COVID-19 declaration under Section 564(b)(1) of the Act, 21 U.S.C.section 360bbb-3(b)(1), unless the authorization is terminated  or revoked sooner.       Influenza A by PCR NEGATIVE NEGATIVE Final   Influenza B by PCR NEGATIVE NEGATIVE Final    Comment: (NOTE) The Xpert Xpress SARS-CoV-2/FLU/RSV plus assay is intended as an aid in the diagnosis of influenza from Nasopharyngeal swab specimens and should not be used as a sole basis for treatment. Nasal washings and aspirates are unacceptable for Xpert Xpress SARS-CoV-2/FLU/RSV testing.  Fact Sheet for Patients: EntrepreneurPulse.com.au  Fact Sheet for Healthcare Providers: IncredibleEmployment.be  This test is not yet approved or cleared by the [place] FDA and has been authorized for detection and/or diagnosis of SARS-CoV-2 by FDA under an Emergency Use Authorization (EUA). This EUA will remain in effect (meaning  this test can be used) for the duration of the COVID-19 declaration under Section 564(b)(1) of the Act, 21 U.S.C. section 360bbb-3(b)(1), unless the authorization is terminated or revoked.  Performed at [facility], [address], [place], [place] [address]   Culture, Respiratory w Gram Stain     Status: None   Collection Time: 03/22/22  2:01 PM   Specimen: Bronchial Alveolar Lavage; Respiratory  Result Value Ref Range Status   Specimen Description   Final    BRONCHIAL ALVEOLAR LAVAGE Performed at [facility], [address] [place], [place] [address]    Special Requests   Final    LEFT UPPER LUNG Performed at [facility], [address] [place], [place] [address]    Gram Stain   Final    FEW WBC PRESENT, PREDOMINANTLY PMN RARE GRAM POSITIVE COCCI    Culture   Final    RARE Normal respiratory flora-no Staph aureus or Pseudomonas seen Performed at [facility], [address] [place], [place] [address]    Report Status 03/25/2022 FINAL  Final  MRSA Next Gen by PCR, Nasal     Status: None   Collection Time: 03/29/22  4:30 AM   Specimen: Nasal Mucosa; Nasal Swab  Result Value Ref Range Status   MRSA by PCR Next Gen NOT DETECTED NOT DETECTED Final    Comment: (NOTE) The GeneXpert MRSA Assay (FDA approved for NASAL specimens only), is one component of a comprehensive MRSA colonization surveillance program. It is not intended to diagnose MRSA infection nor to guide or monitor treatment for MRSA infections. Test performance is not FDA approved in patients less than 65 years old. Performed  at [facility], [address], [place], [place] [address]      Time coordinating discharge: 35 minutes  SIGNED:   Rodena Goldmann, DO [facility] 03/29/2022, 2:39 PM  If 7PM-7AM, please contact night-coverage [email or web address]

## 2022-03-29 NOTE — Progress Notes (Signed)
RN called to inform RT that she had taken pt off BIPAP. VBG results reviewed and were good. Patient on 4 lpm at this time.

## 2022-03-29 NOTE — TOC Transition Note (Signed)
Transition of Care [facility]) - CM/SW Discharge Note   Patient Details  Name: Tony Sullivan MRN: [id number] Date of Birth: 04/12/1961  [facility] CM/SW Contact:  Ihor Gully, LCSW Phone Number: 03/29/2022, 1:16 PM   Clinical Narrative:    Patient in need of NIV. Ordered via [employer]      Barriers to Discharge: No Barriers Identified   Patient Goals and CMS Choice Patient states their goals for this hospitalization and ongoing recovery are:: return home      Discharge Placement                       Discharge Plan and Services                DME Arranged: NIV DME Agency: [employer] Date DME Agency Contacted: 03/29/22 Time DME Agency Contacted: [phone] Representative spoke with at DME Agency: Daph[place] ([place]) Interventions     Readmission Risk Interventions    03/14/2022    3:54 PM 02/28/2022   10:40 AM 09/08/2019    3:22 PM  Readmission Risk Prevention Plan  Transportation Screening Complete Complete Complete  PCP or Specialist Appt within 3-5 Days   Complete  HRI or [place]   Complete  Social Work Consult for [place] Planning/Counseling   Complete  Palliative Care Screening   Not Applicable  Medication Review Press photographer) Complete Complete Complete  PCP or Specialist appointment within 3-5 days of discharge Not Complete    HRI or [place] Complete Complete   SW Recovery Care/Counseling Consult Complete Complete   Palliative Care Screening Not Applicable Not [place] Patient Refused Not Applicable

## 2022-04-01 ENCOUNTER — Inpatient Hospital Stay (HOSPITAL_COMMUNITY)
Admission: EM | Admit: 2022-04-01 | Discharge: 2022-04-05 | DRG: 190 | Disposition: A | Discharge status: Skilled Nursing Facility | Payer: Medicaid Other | Attending: Internal Medicine | Admitting: Internal Medicine

## 2022-04-01 ENCOUNTER — Encounter (HOSPITAL_COMMUNITY): Payer: Self-pay | Admitting: Emergency Medicine

## 2022-04-01 ENCOUNTER — Other Ambulatory Visit: Payer: Self-pay

## 2022-04-01 ENCOUNTER — Other Ambulatory Visit (HOSPITAL_COMMUNITY): Payer: Self-pay

## 2022-04-01 ENCOUNTER — Emergency Department (HOSPITAL_COMMUNITY): Payer: Medicaid Other

## 2022-04-01 ENCOUNTER — Encounter: Payer: Self-pay | Admitting: *Deleted

## 2022-04-01 DIAGNOSIS — J9622 Acute and chronic respiratory failure with hypercapnia: Secondary | ICD-10-CM | POA: Diagnosis present

## 2022-04-01 DIAGNOSIS — E785 Hyperlipidemia, unspecified: Secondary | ICD-10-CM | POA: Diagnosis present

## 2022-04-01 DIAGNOSIS — J441 Chronic obstructive pulmonary disease with (acute) exacerbation: Principal | ICD-10-CM | POA: Diagnosis present

## 2022-04-01 DIAGNOSIS — Z7189 Other specified counseling: Secondary | ICD-10-CM | POA: Diagnosis not present

## 2022-04-01 DIAGNOSIS — Z79899 Other long term (current) drug therapy: Secondary | ICD-10-CM

## 2022-04-01 DIAGNOSIS — Z8249 Family history of ischemic heart disease and other diseases of the circulatory system: Secondary | ICD-10-CM

## 2022-04-01 DIAGNOSIS — I444 Left anterior fascicular block: Secondary | ICD-10-CM | POA: Diagnosis present

## 2022-04-01 DIAGNOSIS — Z825 Family history of asthma and other chronic lower respiratory diseases: Secondary | ICD-10-CM

## 2022-04-01 DIAGNOSIS — F32A Depression, unspecified: Secondary | ICD-10-CM | POA: Diagnosis present

## 2022-04-01 DIAGNOSIS — J9621 Acute and chronic respiratory failure with hypoxia: Secondary | ICD-10-CM | POA: Diagnosis present

## 2022-04-01 DIAGNOSIS — I248 Other forms of acute ischemic heart disease: Secondary | ICD-10-CM | POA: Diagnosis present

## 2022-04-01 DIAGNOSIS — K219 Gastro-esophageal reflux disease without esophagitis: Secondary | ICD-10-CM | POA: Diagnosis present

## 2022-04-01 DIAGNOSIS — C3490 Malignant neoplasm of unspecified part of unspecified bronchus or lung: Secondary | ICD-10-CM | POA: Diagnosis present

## 2022-04-01 DIAGNOSIS — I10 Essential (primary) hypertension: Secondary | ICD-10-CM | POA: Diagnosis present

## 2022-04-01 DIAGNOSIS — Z86711 Personal history of pulmonary embolism: Secondary | ICD-10-CM

## 2022-04-01 DIAGNOSIS — Z91199 Patient's noncompliance with other medical treatment and regimen due to unspecified reason: Secondary | ICD-10-CM

## 2022-04-01 DIAGNOSIS — Z7901 Long term (current) use of anticoagulants: Secondary | ICD-10-CM

## 2022-04-01 DIAGNOSIS — Z515 Encounter for palliative care: Secondary | ICD-10-CM

## 2022-04-01 DIAGNOSIS — I2699 Other pulmonary embolism without acute cor pulmonale: Secondary | ICD-10-CM | POA: Diagnosis present

## 2022-04-01 DIAGNOSIS — Z880 Allergy status to penicillin: Secondary | ICD-10-CM

## 2022-04-01 DIAGNOSIS — R7303 Prediabetes: Secondary | ICD-10-CM | POA: Diagnosis present

## 2022-04-01 DIAGNOSIS — F17210 Nicotine dependence, cigarettes, uncomplicated: Secondary | ICD-10-CM | POA: Diagnosis present

## 2022-04-01 DIAGNOSIS — Z888 Allergy status to other drugs, medicaments and biological substances status: Secondary | ICD-10-CM

## 2022-04-01 DIAGNOSIS — Z832 Family history of diseases of the blood and blood-forming organs and certain disorders involving the immune mechanism: Secondary | ICD-10-CM

## 2022-04-01 DIAGNOSIS — F419 Anxiety disorder, unspecified: Secondary | ICD-10-CM | POA: Diagnosis present

## 2022-04-01 DIAGNOSIS — Z833 Family history of diabetes mellitus: Secondary | ICD-10-CM

## 2022-04-01 LAB — BASIC METABOLIC PANEL
Anion gap: 7 (ref 5–15)
BUN: 11 mg/dL (ref 6–20)
CO2: 32 mmol/L (ref 22–32)
Calcium: 8.6 mg/dL — ABNORMAL LOW (ref 8.9–10.3)
Chloride: 100 mmol/L (ref 98–111)
Creatinine, Ser: 0.79 mg/dL (ref 0.61–1.24)
GFR, Estimated: >60 mL/min (ref >60)
Glucose, Bld: 77 mg/dL (ref 70–99)
Potassium: 4.8 mmol/L (ref 3.5–5.1)
Sodium: 139 mmol/L (ref 135–145)

## 2022-04-01 LAB — CBC
HCT: 43.9 % (ref 39.0–52.0)
Hemoglobin: 13.6 g/dL (ref 13.0–17.0)
MCH: 31.8 pg (ref 26.0–34.0)
MCHC: 31 g/dL (ref 30.0–36.0)
MCV: 102.6 fL — ABNORMAL HIGH (ref 80.0–100.0)
Platelets: 165 10*3/uL (ref 150–400)
RBC: 4.28 MIL/uL (ref 4.22–5.81)
RDW: 15.4 % (ref 11.5–15.5)
WBC: 12.6 10*3/uL — ABNORMAL HIGH (ref 4.0–10.5)
nRBC: 0.3 % — ABNORMAL HIGH (ref 0.0–0.2)

## 2022-04-01 LAB — TROPONIN I (HIGH SENSITIVITY)
Troponin I (High Sensitivity): 25 ng/L — ABNORMAL HIGH (ref <18)
Troponin I (High Sensitivity): 26 ng/L — ABNORMAL HIGH (ref <18)

## 2022-04-01 LAB — BRAIN NATRIURETIC PEPTIDE: B Natriuretic Peptide: 392 pg/mL — ABNORMAL HIGH (ref 0.0–100.0)

## 2022-04-01 IMAGING — DX DG CHEST 1V PORT
2 series · 2 of 2 positions shown · non-contrast
Comparison: [DATE]

CLINICAL DATA: Chest pain and shortness of breath.

EXAM:
PORTABLE CHEST 1 VIEW

[chest ap (1 of 2)]
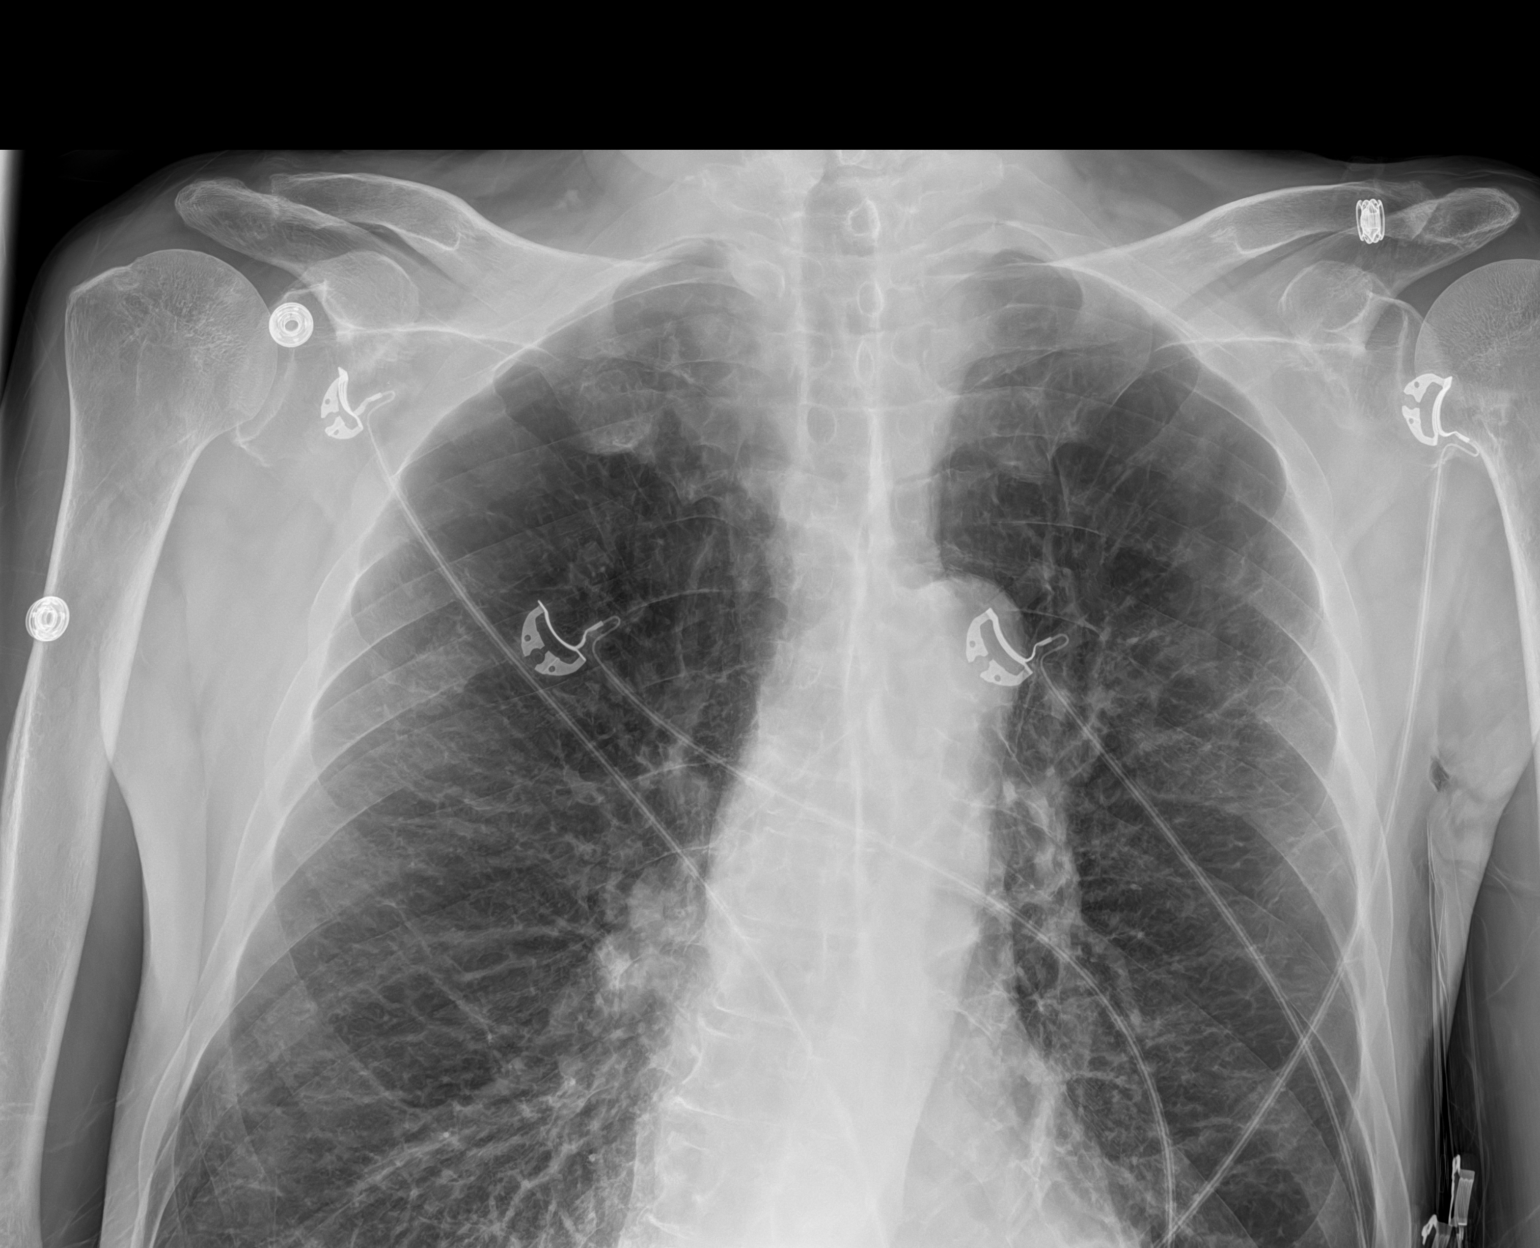

[chest ap (2 of 2)]
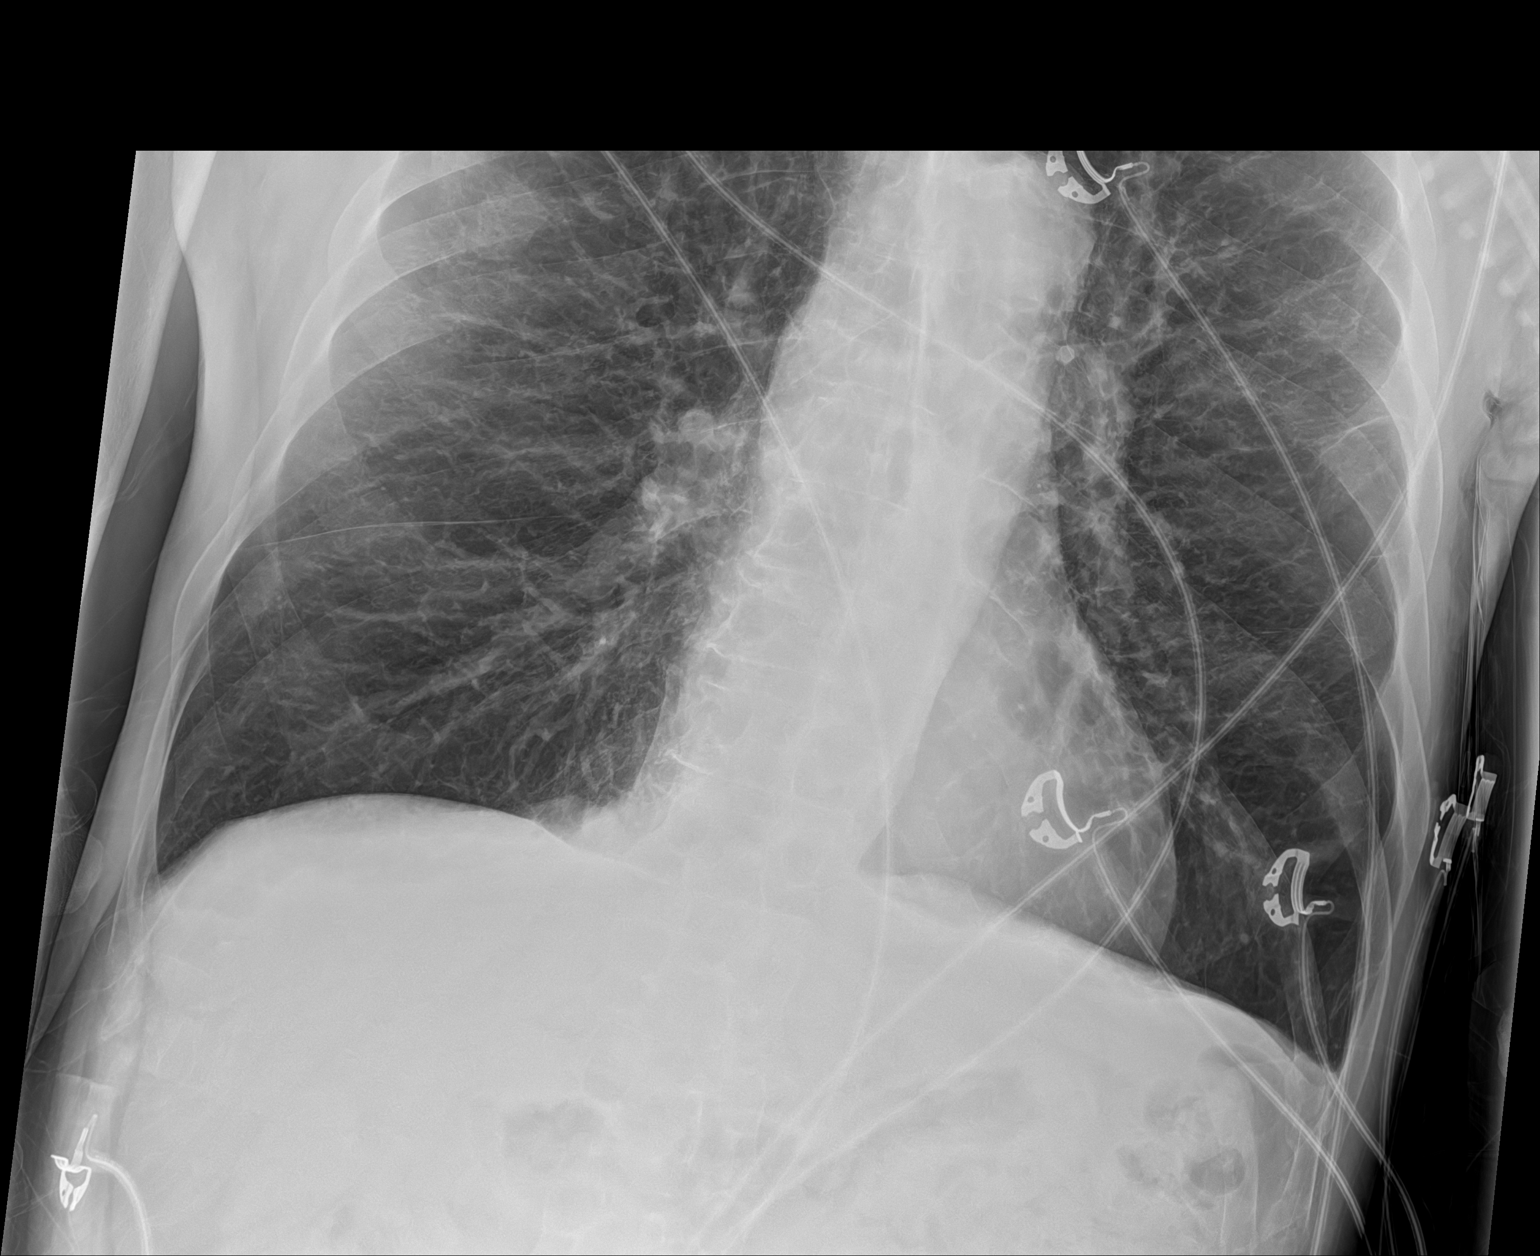

[2 of 2 positions shown; findings below may reference images not displayed]

FINDINGS: The heart size and mediastinal contours are within normal limits.
Lungs are hyperinflated with flattening of the diaphragms and
evidence of moderate severity emphysematous lung disease. There is
no evidence of focal consolidation, pleural effusion or
pneumothorax. A radiopaque fusion plate and screws are seen
overlying the cervical spine. Chronic left-sided rib fractures are
noted.
IMPRESSION: COPD without evidence of acute or active cardiopulmonary disease.

## 2022-04-01 MED ORDER — IPRATROPIUM-ALBUTEROL 0.5-2.5 (3) MG/3ML IN SOLN
3.0000 mL | Freq: Once | RESPIRATORY_TRACT | Status: AC
  Administered 2022-04-01: 3 mL via RESPIRATORY_TRACT
  Filled 2022-04-01: qty 3

## 2022-04-01 MED ORDER — ALBUTEROL SULFATE (2.5 MG/3ML) 0.083% IN NEBU: 2.5000 mg | INHALATION_SOLUTION | RESPIRATORY_TRACT | Status: DC | PRN

## 2022-04-01 MED ORDER — METHYLPREDNISOLONE SODIUM SUCC 125 MG IJ SOLR
125.0000 mg | Freq: Once | INTRAMUSCULAR | Status: AC
  Administered 2022-04-01: 125 mg via INTRAVENOUS
  Filled 2022-04-01: qty 2

## 2022-04-01 MED ORDER — APIXABAN 5 MG PO TABS
5.0000 mg | ORAL_TABLET | Freq: Two times a day (BID) | ORAL | Status: DC
  Administered 2022-04-01 – 2022-04-05 (×9): 5 mg via ORAL
  Filled 2022-04-01 (×9): qty 1

## 2022-04-01 MED ORDER — BENZTROPINE MESYLATE 1 MG PO TABS
0.5000 mg | ORAL_TABLET | Freq: Every day | ORAL | Status: DC
  Administered 2022-04-01 – 2022-04-02 (×2): 0.5 mg via ORAL
  Filled 2022-04-01 (×4): qty 1

## 2022-04-01 MED ORDER — BUDESONIDE 0.25 MG/2ML IN SUSP
RESPIRATORY_TRACT | Status: AC
  Filled 2022-04-01: qty 2

## 2022-04-01 MED ORDER — IPRATROPIUM-ALBUTEROL 0.5-2.5 (3) MG/3ML IN SOLN
3.0000 mL | Freq: Four times a day (QID) | RESPIRATORY_TRACT | Status: DC
  Administered 2022-04-01 (×3): 3 mL via RESPIRATORY_TRACT
  Filled 2022-04-01 (×2): qty 3

## 2022-04-01 MED ORDER — DM-GUAIFENESIN ER 30-600 MG PO TB12
1.0000 | ORAL_TABLET | Freq: Two times a day (BID) | ORAL | Status: DC
  Administered 2022-04-01 – 2022-04-05 (×9): 1 via ORAL
  Filled 2022-04-01 (×10): qty 1

## 2022-04-01 MED ORDER — ACETAMINOPHEN 650 MG RE SUPP: 650.0000 mg | Freq: Four times a day (QID) | RECTAL | Status: DC | PRN

## 2022-04-01 MED ORDER — BUDESON-GLYCOPYRROL-FORMOTEROL 160-9-4.8 MCG/ACT IN AERO: 2.0000 | INHALATION_SPRAY | Freq: Two times a day (BID) | RESPIRATORY_TRACT | Status: DC

## 2022-04-01 MED ORDER — SODIUM CHLORIDE 0.9% FLUSH: 3.0000 mL | INTRAVENOUS | Status: DC | PRN

## 2022-04-01 MED ORDER — BISACODYL 10 MG RE SUPP: 10.0000 mg | Freq: Every day | RECTAL | Status: DC | PRN

## 2022-04-01 MED ORDER — DOXYCYCLINE HYCLATE 100 MG PO TABS
100.0000 mg | ORAL_TABLET | Freq: Two times a day (BID) | ORAL | Status: DC
  Administered 2022-04-01 – 2022-04-05 (×9): 100 mg via ORAL
  Filled 2022-04-01 (×10): qty 1

## 2022-04-01 MED ORDER — DORZOLAMIDE HCL 2 % OP SOLN
1.0000 [drp] | Freq: Two times a day (BID) | OPHTHALMIC | Status: DC
  Administered 2022-04-01 – 2022-04-05 (×9): 1 [drp] via OPHTHALMIC
  Filled 2022-04-01: qty 10

## 2022-04-01 MED ORDER — SODIUM CHLORIDE 0.9 % IV SOLN
250.0000 mL | INTRAVENOUS | Status: DC | PRN
Start: 2022-04-01 — End: 2022-04-06

## 2022-04-01 MED ORDER — IPRATROPIUM-ALBUTEROL 0.5-2.5 (3) MG/3ML IN SOLN
3.0000 mL | Freq: Three times a day (TID) | RESPIRATORY_TRACT | Status: DC
  Administered 2022-04-02 – 2022-04-05 (×11): 3 mL via RESPIRATORY_TRACT
  Filled 2022-04-01 (×7): qty 3
  Filled 2022-04-01 (×2): qty 6

## 2022-04-01 MED ORDER — POLYETHYLENE GLYCOL 3350 17 G PO PACK: 17.0000 g | PACK | Freq: Every day | ORAL | Status: DC | PRN

## 2022-04-01 MED ORDER — NICOTINE 14 MG/24HR TD PT24
14.0000 mg | MEDICATED_PATCH | Freq: Every day | TRANSDERMAL | Status: DC
  Filled 2022-04-01 (×4): qty 1

## 2022-04-01 MED ORDER — GABAPENTIN 100 MG PO CAPS
100.0000 mg | ORAL_CAPSULE | Freq: Three times a day (TID) | ORAL | Status: DC
  Administered 2022-04-01 – 2022-04-05 (×14): 100 mg via ORAL
  Filled 2022-04-01 (×16): qty 1

## 2022-04-01 MED ORDER — SODIUM CHLORIDE 0.9% FLUSH
3.0000 mL | Freq: Two times a day (BID) | INTRAVENOUS | Status: DC
  Administered 2022-04-01 – 2022-04-04 (×5): 3 mL via INTRAVENOUS

## 2022-04-01 MED ORDER — IPRATROPIUM-ALBUTEROL 0.5-2.5 (3) MG/3ML IN SOLN
RESPIRATORY_TRACT | Status: AC
  Filled 2022-04-01: qty 3

## 2022-04-01 MED ORDER — ADULT MULTIVITAMIN W/MINERALS CH
1.0000 | ORAL_TABLET | Freq: Every day | ORAL | Status: DC
  Administered 2022-04-01 – 2022-04-05 (×5): 1 via ORAL
  Filled 2022-04-01 (×5): qty 1

## 2022-04-01 MED ORDER — LOXAPINE SUCCINATE 5 MG PO CAPS
10.0000 mg | ORAL_CAPSULE | Freq: Every day | ORAL | Status: DC
Start: 2022-04-01 — End: 2022-04-06
  Administered 2022-04-01 – 2022-04-03 (×3): 10 mg via ORAL
  Filled 2022-04-01 (×3): qty 2
  Filled 2022-04-01: qty 1
  Filled 2022-04-01: qty 2
  Filled 2022-04-01: qty 1
  Filled 2022-04-01: qty 2

## 2022-04-01 MED ORDER — ATORVASTATIN CALCIUM 40 MG PO TABS
40.0000 mg | ORAL_TABLET | Freq: Every day | ORAL | Status: DC
  Administered 2022-04-01 – 2022-04-05 (×5): 40 mg via ORAL
  Filled 2022-04-01 (×6): qty 1

## 2022-04-01 MED ORDER — LATANOPROST 0.005 % OP SOLN
1.0000 [drp] | Freq: Every day | OPHTHALMIC | Status: DC
Start: 2022-04-01 — End: 2022-04-06
  Administered 2022-04-01 – 2022-04-04 (×4): 1 [drp] via OPHTHALMIC
  Filled 2022-04-01: qty 2.5

## 2022-04-01 MED ORDER — PANTOPRAZOLE SODIUM 40 MG PO TBEC
40.0000 mg | DELAYED_RELEASE_TABLET | Freq: Every day | ORAL | Status: DC
  Administered 2022-04-01 – 2022-04-05 (×5): 40 mg via ORAL
  Filled 2022-04-01 (×6): qty 1

## 2022-04-01 MED ORDER — ACETAMINOPHEN 325 MG PO TABS: 650.0000 mg | ORAL_TABLET | Freq: Four times a day (QID) | ORAL | Status: DC | PRN

## 2022-04-01 MED ORDER — BUDESONIDE 0.25 MG/2ML IN SUSP
0.2500 mg | Freq: Two times a day (BID) | RESPIRATORY_TRACT | Status: DC
  Administered 2022-04-01 – 2022-04-05 (×9): 0.25 mg via RESPIRATORY_TRACT
  Filled 2022-04-01 (×8): qty 2

## 2022-04-01 MED ORDER — SODIUM CHLORIDE 0.9% FLUSH
3.0000 mL | Freq: Two times a day (BID) | INTRAVENOUS | Status: DC
  Administered 2022-04-01 – 2022-04-05 (×9): 3 mL via INTRAVENOUS

## 2022-04-01 MED ORDER — METHYLPREDNISOLONE SODIUM SUCC 40 MG IJ SOLR
40.0000 mg | Freq: Two times a day (BID) | INTRAMUSCULAR | Status: DC
  Administered 2022-04-01 – 2022-04-04 (×7): 40 mg via INTRAVENOUS
  Filled 2022-04-01 (×7): qty 1

## 2022-04-01 NOTE — Progress Notes (Signed)
Order placed for PET scan per Dr. Katragadda 

## 2022-04-01 NOTE — ED Notes (Signed)
[facility] notified of patient needing transportation back home. 

## 2022-04-01 NOTE — ED Notes (Signed)
Pt states, "I have my money, it was given back to me." Pt confirms personal belongings are accompanying him to his room 323

## 2022-04-01 NOTE — ED Triage Notes (Signed)
Pt brought in by [facility] on CPAP after pt called for SOB and CP. Pt recently discharged Friday for same. Pt received 2 duonebs by EMS.

## 2022-04-01 NOTE — ED Provider Notes (Addendum)
[facility] Provider Note   CSN: [id number] Arrival date & time: 04/01/22  0136     History  Chief Complaint  Patient presents with   Respiratory Distress    Tony Sullivan is a 61 y.o. male.  Patient is a 61 year old male with history of COPD, GERD.  Patient presenting today for evaluation of shortness of breath.  Patient called EMS and was apparently placed on CPAP, then transported here.  He denies any fevers or chills.  He does describe some tightness across his chest.  He denies any leg swelling.  The history is provided by the patient.      Home Medications Prior to Admission medications   Medication Sig Start Date End Date Taking? Authorizing Provider  acetaminophen (TYLENOL) 325 MG tablet Take 2 tablets (650 mg total) by mouth every 6 (six) hours as needed for mild pain (or Fever >/= 101). 10/30/21   [name], MD  albuterol (PROVENTIL) (2.5 MG/3ML) 0.083% nebulizer solution Take 3 mLs (2.5 mg total) by nebulization every 4 (four) hours as needed for wheezing or shortness of breath. 12/12/21 12/12/22  [name], MD  albuterol (VENTOLIN HFA) 108 (90 Base) MCG/ACT inhaler Inhale 2 puffs into the lungs every 6 (six) hours as needed for wheezing or shortness of breath. 03/15/22   [name], Pratik D, DO  apixaban (ELIQUIS) 5 MG TABS tablet Take 1 tablet (5 mg total) by mouth 2 (two) times daily. 03/22/22 05/21/22  [name], Pratik D, DO  atorvastatin (LIPITOR) 40 MG tablet TAKE ONE TABLET BY MOUTH DAILY Patient taking differently: Take 40 mg by mouth daily. 07/06/20   [name], MD  benztropine (COGENTIN) 0.5 MG tablet Take 0.5 mg by mouth at bedtime.    [provider]  Budeson-Glycopyrrol-Formoterol (BREZTRI AEROSPHERE) 160-9-4.8 MCG/ACT AERO Inhale 2 puffs into the lungs in the morning and at bedtime. 12/12/21   [name], MD  diclofenac Sodium (VOLTAREN) 1 % GEL Apply 2 g topically 4 (four) times daily as needed (pain).    [provider]  dorzolamide (TRUSOPT) 2 % ophthalmic solution Place 1 drop into the left eye 2 (two) times daily. 12/12/20   [provider]  gabapentin (NEURONTIN) 100 MG capsule Take 1 capsule (100 mg total) by mouth 3 (three) times daily. 03/25/22 05/24/22  Deatra James, MD  guaiFENesin 200 MG tablet Take 2 tablets (400 mg total) by mouth every 6 (six) hours as needed for cough or to loosen phlegm. 03/02/22   Orson Eva, MD  latanoprost (XALATAN) 0.005 % ophthalmic solution Place 1 drop into both eyes at bedtime. 05/24/19   [provider]  loxapine (LOXITANE) 10 MG capsule Take 10 mg by mouth at bedtime.     [provider]  mirtazapine (REMERON) 30 MG tablet Take 1 tablet (30 mg total) by mouth at bedtime. 10/30/21   [name], MD  Multiple Vitamin (MULTIVITAMIN WITH MINERALS) TABS tablet Take 1 tablet by mouth daily. 10/31/21   [name], MD  OXYGEN Inhale 3-4 L into the lungs as directed. 3-4 liters with sleep and exertion as needed for low oxygen    [provider]  pantoprazole (PROTONIX) 40 MG tablet Take 1 tablet (40 mg total) by mouth daily. 10/30/21   [name], MD  polyethylene glycol (MIRALAX / GLYCOLAX) 17 g packet TAKE 17 GRAMS BY MOUTH DAILY Patient taking differently: Take 17 g by mouth daily as needed for mild constipation. 03/09/20   Henrene Pastor,  Docia Chuck, MD  predniSONE (DELTASONE) 10 MG tablet Take 4 tablets (40 mg total) by mouth daily for 5 days. 03/29/22 04/03/22  [name], Pratik D, DO  senna-docusate (SENOKOT-S) 8.6-50 MG tablet Take 2 tablets by mouth at bedtime. Patient taking differently: Take 2 tablets by mouth at bedtime as needed for mild constipation. 10/30/21   [name], MD  simethicone (MYLICON) 80 MG chewable tablet Chew 1 tablet (80 mg total) by mouth 4 (four) times daily as needed for flatulence. 03/15/22   [name], Pratik D, DO  zolpidem (AMBIEN) 5 MG tablet Take 1 tablet (5 mg total) by mouth at bedtime as needed for up to  20 days for sleep. 03/25/22 04/14/22  Deatra James, MD      Allergies    Penicillins and Levocetirizine    Review of Systems   Review of Systems  All other systems reviewed and are negative.  Physical Exam Updated Vital Signs BP (!) 128/103   Pulse 95   Temp 98.1 F (36.7 C)   Resp (!) 21   Ht 5\' 11"  (1.803 m)   Wt 62.4 kg   SpO2 98%   BMI 19.19 kg/m  Physical Exam Vitals and nursing note reviewed.  Constitutional:      General: He is not in acute distress.    Appearance: He is well-developed. He is not diaphoretic.  HENT:     Head: Normocephalic and atraumatic.  Cardiovascular:     Rate and Rhythm: Normal rate and regular rhythm.     Heart sounds: No murmur heard.   No friction rub.  Pulmonary:     Effort: Pulmonary effort is normal. No respiratory distress.     Breath sounds: Rhonchi present. No wheezing or rales.  Abdominal:     General: Bowel sounds are normal. There is no distension.     Palpations: Abdomen is soft.     Tenderness: There is no abdominal tenderness.  Musculoskeletal:        General: Normal range of motion.     Cervical back: Normal range of motion and neck supple.  Skin:    General: Skin is warm and dry.  Neurological:     Mental Status: He is alert and oriented to person, place, and time.     Coordination: Coordination normal.    ED Results / Procedures / Treatments   Labs (all labs ordered are listed, but only abnormal results are displayed) Labs Reviewed  BASIC METABOLIC PANEL  CBC  TROPONIN I (HIGH SENSITIVITY)    EKG EKG Interpretation  Date/Time:  Monday April 01 2022 01:49:47 EDT Ventricular Rate:  110 PR Interval:  135 QRS Duration: 77 QT Interval:  330 QTC Calculation: 447 R Axis:   -87 Text Interpretation: Sinus tachycardia with irregular rate Biatrial enlargement Left anterior fascicular block Abnormal R-wave progression, late transition Artifact in lead(s) V4 V5 and baseline wander in lead(s) V2 Confirmed by  Veryl Speak [phone]) on 04/01/2022 4:50:14 AM  Radiology No results found.  Procedures Procedures    Medications Ordered in ED Medications - No data to display  ED Course/ Medical Decision Making/ A&P  Patient presenting here with complaints of shortness of breath.  He was brought by EMS on CPAP.  He was just recently discharged from the hospital.  Patient continued on BiPAP here in the ER and work-up initiated.  His laboratory studies are all basically unremarkable.  After a brief period on the BiPAP, he was placed on nasal cannula and tolerated this  well.  He is now sleeping and resting comfortably in no distress.  Oxygen saturations are 99% while on 4 L nasal cannula which is what he uses at home.  Work-up otherwise reassuring.  When attempting to stand and ambulate patient, he became dyspneic again, with saturations dropping down into the upper 70s.  His oxygen was increased to 5 L.  He is now having increased work of breathing and rhonchi.  Additional nebulizer treatments given, but I feel as though the patient will need to be admitted to the hospital.  Final Clinical Impression(s) / ED Diagnoses Final diagnoses:  None    Rx / DC Orders ED Discharge Orders     None         Veryl Speak, MD 04/01/22 1423    Veryl Speak, MD 04/01/22 [phone]

## 2022-04-01 NOTE — Plan of Care (Signed)
Pt brought to the unit via stretcher by ED staff, pt transferred with two assist to bed. Resting comfortably at this time, currently on 2 liters nasal canula. VS WNL.

## 2022-04-01 NOTE — H&P (Addendum)
Patient Demographics:    Tony Sullivan, is a 61 y.o. male  MRN: [id number]   DOB - 1960-12-14  Admit Date - 04/01/2022  Outpatient Primary MD for the patient is [name], FNP   Assessment & Plan:   Assessment and Plan:  1) acute COPD exacerbation--required BiPAP -Continue BiPAP nightly -Steroids, antibiotics and bronchodilators and mucolytics as ordered  2) acute on chronic hypoxic and hypercapnic respiratory failure -Secondary to #1 above -Manage as above #1 -Patient is in the process of getting trilogy/NIV machine  3) social/ethics--recurrent admissions to the hospital, multiple comorbid conditions palliative care consult appreciated patient remains full code without limitations to treatment  4) squamous cell lung cancer--- diagnosed by bronchoscopy on 03/22/2022 after presenting with hemoptysis and endobronchial mass outpatient PET scan pending -Consult with Dr. Delton Coombes pending  5) elevated troponin--no frank ACS type symptoms -Suspect related to demand ischemia in the setting of hypoxic respiratory failure EKG with sinus tachycardia, left anterior fascicular block is not new  6) recent pulmonary embolism in the setting of underlying malignancy--- continue Eliquis  7) HTN continue losartan  Disposition/Need for in-Hospital Stay- patient unable to be discharged at this time due to acute on chronic hypoxic and hypercapnic respiratory failure requiring BiPAP and supplemental oxygen on interventions Dispo: The patient is from: Home              Anticipated d/c is to: Home              Anticipated d/c date is: 1 day              Patient currently is not medically stable to d/c. Barriers: Not Clinically Stable-   With History of - Reviewed by me  Past Medical History:  Diagnosis Date    Anxiety    Arthritis    COPD (chronic obstructive pulmonary disease) ([place]) 2004   diagnosed in 2004, no PFT's to date.  Started on home O2 12/2013, after found to be desatting at PCP's office, and referred to pulmonology.   Depression    GERD (gastroesophageal reflux disease)    High blood pressure    Prediabetes    Seasonal allergies    Substance abuse ([place])    Tobacco dependence       Past Surgical History:  Procedure Laterality Date   ANTERIOR CERVICAL DECOMP/DISCECTOMY FUSION N/A 09/06/2019   Procedure: Anterior Cervical Discecectomy Fusion - Cerivcal Five-Cervical Six;  Surgeon: Kary Kos, MD;  Location: [facility];  Service: Neurosurgery;  Laterality: N/A;  Anterior Cervical Discecectomy Fusion - Cerivcal Five-Cervical Six   ANTERIOR CERVICAL DISCECTOMY  09/06/2019   APPENDECTOMY  1980   COLONOSCOPY WITH PROPOFOL N/A 12/10/2016   Procedure: COLONOSCOPY WITH PROPOFOL;  Surgeon: [name], MD;  Location: [facility];  Service: Endoscopy;  Laterality: N/A;   FLEXIBLE BRONCHOSCOPY Left 03/22/2022   Procedure: FLEXIBLE BRONCHOSCOPY;  Surgeon: Rigoberto Noel, MD;  Location: [facility];  Service:  Pulmonary;  Laterality: Left;   HIP SURGERY Right    SHOULDER ARTHROSCOPY Right     Chief Complaint  Patient presents with   Respiratory Distress      HPI:    Tony Sullivan  is a 61 y.o. male with past medical history relevant for chronic hypoxic and hypercapnic respiratory failure in the setting of underlying COPD and ongoing tobacco abuse, GERD, HTN, HLD who is on chronic anticoagulation who was discharged on 03/28/2022 returns to the ED with worsening respiratory status requiring continuous BiPAP in the ED  -History of noncompliance with recurrent admissions to the hospital for respiratory exacerbations -In the ED oxygen saturation went down to 70s despite nebulizer treatment steroids and other interventions No fever  Or chills  -Denies chest pains palpitations or dizziness, he did have  some chest tightness which is dyspnea and respiratory distress  No Nausea, Vomiting or Diarrhea -Patient reports cough that is at times productive with colored sputum -He admits to ongoing tobacco use   Review of systems:    In addition to the HPI above,   A full Review of  Systems was done, all other systems reviewed are negative except as noted above in HPI , .    Social History:  Reviewed by me    Social History   Tobacco Use   Smoking status: Some Days    Packs/day: 0.25    Years: 36.00    Pack years: 9.00    Types: Cigarettes    Last attempt to quit: 10/2021    Years since quitting: 0.4   Smokeless tobacco: Never  Substance Use Topics   Alcohol use: Not Currently    Alcohol/week: 14.0 - 21.0 standard drinks    Types: 14 - 21 Standard drinks or equivalent per week    Comment: 2-3 beers/day, with occasional "binges".  No history of withdrawal       Family History :  Reviewed by me    Family History  Problem Relation Age of Onset   Emphysema Mother    Allergies Mother        "everyone in family"   Asthma Mother        "everyone in family"   Heart disease Mother    Clotting disorder Mother    Cancer Mother    Emphysema Father    Allergies Father    Allergies Son    Seizures Brother    Diabetes Brother       Home Medications:   Prior to Admission medications   Medication Sig Start Date End Date Taking? Authorizing Provider  acetaminophen (TYLENOL) 325 MG tablet Take 2 tablets (650 mg total) by mouth every 6 (six) hours as needed for mild pain (or Fever >/= 101). 10/30/21   [name], MD  albuterol (PROVENTIL) (2.5 MG/3ML) 0.083% nebulizer solution Take 3 mLs (2.5 mg total) by nebulization every 4 (four) hours as needed for wheezing or shortness of breath. 12/12/21 12/12/22  [name], MD  albuterol (VENTOLIN HFA) 108 (90 Base) MCG/ACT inhaler Inhale 2 puffs into the lungs every 6 (six) hours as needed for wheezing or shortness of breath.  03/15/22   [name], Pratik D, DO  apixaban (ELIQUIS) 5 MG TABS tablet Take 1 tablet (5 mg total) by mouth 2 (two) times daily. 03/22/22 05/21/22  [name], Pratik D, DO  atorvastatin (LIPITOR) 40 MG tablet TAKE ONE TABLET BY MOUTH DAILY Patient taking differently: Take 40 mg by mouth daily. 07/06/20   [name],  MD  benztropine (COGENTIN) 0.5 MG tablet Take 0.5 mg by mouth at bedtime.    [provider]  Budeson-Glycopyrrol-Formoterol (BREZTRI AEROSPHERE) 160-9-4.8 MCG/ACT AERO Inhale 2 puffs into the lungs in the morning and at bedtime. 12/12/21   [name], MD  diclofenac Sodium (VOLTAREN) 1 % GEL Apply 2 g topically 4 (four) times daily as needed (pain).    [provider]  dorzolamide (TRUSOPT) 2 % ophthalmic solution Place 1 drop into the left eye 2 (two) times daily. 12/12/20   [provider]  gabapentin (NEURONTIN) 100 MG capsule Take 1 capsule (100 mg total) by mouth 3 (three) times daily. 03/25/22 05/24/22  Deatra James, MD  guaiFENesin 200 MG tablet Take 2 tablets (400 mg total) by mouth every 6 (six) hours as needed for cough or to loosen phlegm. 03/02/22   Orson Eva, MD  latanoprost (XALATAN) 0.005 % ophthalmic solution Place 1 drop into both eyes at bedtime. 05/24/19   [provider]  loxapine (LOXITANE) 10 MG capsule Take 10 mg by mouth at bedtime.     [provider]  mirtazapine (REMERON) 30 MG tablet Take 1 tablet (30 mg total) by mouth at bedtime. 10/30/21   [name], MD  Multiple Vitamin (MULTIVITAMIN WITH MINERALS) TABS tablet Take 1 tablet by mouth daily. 10/31/21   [name], MD  OXYGEN Inhale 3-4 L into the lungs as directed. 3-4 liters with sleep and exertion as needed for low oxygen    [provider]  pantoprazole (PROTONIX) 40 MG tablet Take 1 tablet (40 mg total) by mouth daily. 10/30/21   [name], MD  polyethylene glycol (MIRALAX / GLYCOLAX) 17 g packet TAKE 17 GRAMS BY MOUTH DAILY Patient taking  differently: Take 17 g by mouth daily as needed for mild constipation. 03/09/20   [name], MD  predniSONE (DELTASONE) 10 MG tablet Take 4 tablets (40 mg total) by mouth daily for 5 days. 03/29/22 04/03/22  [name], Pratik D, DO  senna-docusate (SENOKOT-S) 8.6-50 MG tablet Take 2 tablets by mouth at bedtime. Patient taking differently: Take 2 tablets by mouth at bedtime as needed for mild constipation. 10/30/21   [name], MD  simethicone (MYLICON) 80 MG chewable tablet Chew 1 tablet (80 mg total) by mouth 4 (four) times daily as needed for flatulence. 03/15/22   [name], Pratik D, DO  zolpidem (AMBIEN) 5 MG tablet Take 1 tablet (5 mg total) by mouth at bedtime as needed for up to 20 days for sleep. 03/25/22 04/14/22  Deatra James, MD     Allergies:     Allergies  Allergen Reactions   Penicillins Anaphylaxis    Has patient had a PCN reaction causing immediate rash, facial/tongue/throat swelling, SOB or lightheadedness with hypotension: Yes Has patient had a PCN reaction causing severe rash involving mucus membranes or skin necrosis: No Has patient had a PCN reaction that required hospitalization No Has patient had a PCN reaction occurring within the last 10 years: No If all of the above answers are "NO", then may proceed with Cephalosporin use.    Levocetirizine Other (See Comments)    Muscle cramps     Physical Exam:   Vitals  Blood pressure 118/81, pulse 89, temperature 98 F (36.7 C), resp. rate 17, height _0  (1.803 m), weight 58.9 kg, SpO2 98 %.  Physical Examination: General appearance - alert,  in no distress , frail appearing Mental status - alert, oriented to person, place, and time,  Nose--nasal cannula alternating with BiPAP Eyes - sclera anicteric Neck - supple, no JVD elevation , Chest -diminished breath sounds, few scattered wheezes  heart - S1 and S2 normal, regular  Abdomen - soft, nontender, nondistended, +BS Neurological - screening mental status exam  normal, neck supple without rigidity, cranial nerves II through XII intact, DTR's normal and symmetric Extremities - no pedal edema noted, intact peripheral pulses  Skin - warm, dry     Data Review:    CBC Recent Labs  Lab 03/28/22 1016 03/29/22 0427 04/01/22 0208  WBC 13.1* 10.8* 12.6*  HGB 13.1 10.9* 13.6  HCT 42.8 34.7* 43.9  PLT 172 171 165  MCV 103.9* 102.1* 102.6*  MCH 31.8 32.1 31.8  MCHC 30.6 31.4 31.0  RDW 15.7* 15.4 15.4  LYMPHSABS 0.4*  --   --   MONOABS 0.7  --   --   EOSABS 0.0  --   --   BASOSABS 0.1  --   --    ------------------------------------------------------------------------------------------------------------------  Chemistries  Recent Labs  Lab 03/28/22 1016 03/29/22 0427 04/01/22 0208  NA 144 143 139  K 4.3 3.8 4.8  CL 100 101 100  CO2 35* 35* 32  GLUCOSE 110* 85 77  BUN _0 CREATININE 0.84 0.77 0.79  CALCIUM 9.2 8.6* 8.6*  MG  --  2.6*  --    ------------------------------------------------------------------------------------------------------------------ estimated creatinine clearance is 83.9 mL/min (by C-G formula based on SCr of 0.79 mg/dL). ------------------------------------------------------------------------------------------------------------------ No results for input(s): TSH, T4TOTAL, T3FREE, THYROIDAB in the last 72 hours.  Invalid input(s): FREET3   Coagulation profile No results for input(s): INR, PROTIME in the last 168 hours. ------------------------------------------------------------------------------------------------------------------- No results for input(s): DDIMER in the last 72 hours. -------------------------------------------------------------------------------------------------------------------  Cardiac Enzymes No results for input(s): CKMB, TROPONINI, MYOGLOBIN in the last 168 hours.  Invalid input(s):  CK ------------------------------------------------------------------------------------------------------------------    Component Value Date/Time   BNP 392.0 (H) 04/01/2022 0208     ---------------------------------------------------------------------------------------------------------------  Urinalysis    Component Value Date/Time   COLORURINE YELLOW 10/16/2021 0130   APPEARANCEUR CLEAR 10/16/2021 0130   LABSPEC 1.020 10/16/2021 0130   PHURINE 6.0 10/16/2021 0130   GLUCOSEU NEGATIVE 10/16/2021 0130   HGBUR TRACE (A) 10/16/2021 0130   BILIRUBINUR NEGATIVE 10/16/2021 0130   KETONESUR NEGATIVE 10/16/2021 0130   PROTEINUR NEGATIVE 10/16/2021 0130   UROBILINOGEN 1.0 06/12/2015 1021   NITRITE NEGATIVE 10/16/2021 0130   LEUKOCYTESUR NEGATIVE 10/16/2021 0130    ----------------------------------------------------------------------------------------------------------------   Imaging Results:    DG Chest Portable 1 View  Result Date: 04/01/2022 CLINICAL DATA:  Chest pain and shortness of breath. EXAM: PORTABLE CHEST 1 VIEW COMPARISON:  March 28, 2022 FINDINGS: The heart size and mediastinal contours are within normal limits. Lungs are hyperinflated with flattening of the diaphragms and evidence of moderate severity emphysematous lung disease. There is no evidence of focal consolidation, pleural effusion or pneumothorax. A radiopaque fusion plate and screws are seen overlying the cervical spine. Chronic left-sided rib fractures are noted. IMPRESSION: COPD without evidence of acute or active cardiopulmonary disease. Electronically Signed   By: Virgina Norfolk M.D.   On: 04/01/2022 02:24    Radiological Exams on Admission: DG Chest Portable 1 View  Result Date: 04/01/2022 CLINICAL DATA:  Chest pain and shortness of breath. EXAM: PORTABLE CHEST 1 VIEW COMPARISON:  March 28, 2022 FINDINGS: The heart size and mediastinal contours are within normal limits. Lungs are hyperinflated with flattening  of the diaphragms and evidence of moderate severity emphysematous lung disease.  There is no evidence of focal consolidation, pleural effusion or pneumothorax. A radiopaque fusion plate and screws are seen overlying the cervical spine. Chronic left-sided rib fractures are noted. IMPRESSION: COPD without evidence of acute or active cardiopulmonary disease. Electronically Signed   By: Virgina Norfolk M.D.   On: 04/01/2022 02:24    DVT Prophylaxis -SCD/Heparin AM Labs Ordered, also please review Full Orders  Family Communication: Admission, patients condition and plan of care including tests being ordered have been discussed with the patient who indicate understanding and agree with the plan   Condition   -stable  [name] M.D on 04/01/2022 at 6:26 PM Go to [email or web address] -  for contact info  [facility] - Office  [phone]

## 2022-04-01 NOTE — Consult Note (Signed)
Consultation Note Date: 04/01/2022   Patient Name: Tony Sullivan  DOB: 09-May-1961  MRN: [id number]  Age / Sex: 61 y.o., male  PCP: Tony Drafts, FNP Referring Physician: [name], MD  Reason for Consultation: Establishing goals of care  HPI/Patient Profile: 61 y.o. male  with past medical history of none hospital stays and 18 ED visits in the last 6 months, COPD with chronic respiratory failure on 3 to 4 L, HTN/HLD, GERD, tobacco abuse, anxiety/depression, arthritis, history of substance abuse, anterior cervical decompression/discectomy 2020, appendectomy 1980, colonoscopy with propofol 2018, right hip surgery, right shoulder arthroscopy, continues to smoke although some notes reads that he quit in March 2023, also using marijuana and cocaine admitted on 04/01/2022 with COPD exacerbation.  It seems that his pulmonologist is Tony Sullivan, he was last seen 01/18/2022.  Clinical Assessment and Goals of Care: I have reviewed medical records including EPIC notes, labs and imaging, received report from RN, assessed the patient.  Tony Sullivan is sitting up in bed.  He we will make and briefly keep eye contact.  He is clearly short of breath and somewhat disengaged.  He is able to somewhat interact/answer my questions, but he is limited today.  I believe that he can make his basic needs known.  There is no family at bedside at this time.  We met at the bedside to discuss diagnosis prognosis, GOC, EOL wishes, disposition and options.  I introduced Palliative Medicine as specialized medical care for people living with serious illness. It focuses on providing relief from the symptoms and stress of a serious illness. The goal is to improve quality of life for both the patient and the family.  Tony Sullivan was referred to [facility] for outpatient palliative services on 5/24.  They have not had their first visit.  We discussed a brief  life review of the patient.  Tony Sullivan shares that he has natural children but names his stepdaughter, Tony Sullivan as his healthcare surrogate.  Per chart review, he has been living independently, declines short-term rehab or long-term care.  We then focused on their current illness.  Again, somewhat limited discussions today due to Tony Sullivan ability and desire to participate.  We talk about his respiratory status and the treatment plan.  We talk about morphine for symptom management.  As he remains full code, I would not recommend morphine.  The natural disease trajectory and expectations at EOL were discussed.  Advanced directives, concepts specific to code status, artifical feeding and hydration, and rehospitalization were considered and discussed.  Tony Sullivan declines to discuss CODE STATUS instead stating, "call Tony Sullivan"  Referral for palliative Care services through [facility] was made 5/24.  They have been unable to have first visit.Marland Kitchen  Discussed the importance of continued conversation with family and the medical providers regarding overall plan of care and treatment options, ensuring decisions are within the context of the patient's values and GOCs.  Questions and concerns were addressed.   The family was encouraged to call with questions or concerns.  PMT will continue to support holistically.  Conference with attending, bedside nursing staff, transition of care team related to patient condition, needs, goals of care, disposition.   HCPOA OTHER -Tony Sullivan names his stepdaughter, Tony Sullivan, as his healthcare surrogate.  He tells me that he does have natural children.  I attempted discussed the importance of healthcare power of attorney paperwork with him, but he tells me that he is unable to have these discussions at this time.    SUMMARY OF RECOMMENDATIONS   At this point continue full scope/full code. Awaiting [facility] outpatient palliative services Historically, Tony Sullivan has declined  short-term rehab or long-term care.    Code Status/Advance Care Planning: Full code -I attempt to speak with Tony Sullivan about CODE STATUS.  He briskly states, "call Tony Sullivan".  He is unable/unwilling to hold these discussions at this time.  Symptom Management:  Per hospitalist, no additional needs at this time.  Palliative Prophylaxis:  Frequent Pain Assessment and Oral Care  Additional Recommendations (Limitations, Scope, Preferences): Full Scope Treatment  Psycho-social/Spiritual:  Desire for further Chaplaincy support:yes Additional Recommendations: Caregiving  Support/Resources and Education on Hospice  Prognosis:  Unable to determine, based on outcomes, guarded.  3 months or less would not be surprising based on chronic illness burden, decreasing functional status, 9 hospital stays and 18 ED visits in the last 6 months.  Discharge Planning:  To be determined, based on outcomes and patient choice.  He historically declines short-term rehab/long-term care.  He had been referred to [facility] outpatient palliative on 5/24 but they not yet been able to see him.         Primary Diagnoses: Present on Admission:  Acute exacerbation of chronic obstructive pulmonary disease (COPD) ([place])   I have reviewed the medical record, interviewed the patient and family, and examined the patient. The following aspects are pertinent.  Past Medical History:  Diagnosis Date   Anxiety    Arthritis    COPD (chronic obstructive pulmonary disease) ([place]) 2004   diagnosed in 2004, no PFT's to date.  Started on home O2 12/2013, after found to be desatting at PCP's office, and referred to pulmonology.   Depression    GERD (gastroesophageal reflux disease)    High blood pressure    Prediabetes    Seasonal allergies    Substance abuse (HCC)    Tobacco dependence    Social History   Socioeconomic History   Marital status: Single    Spouse name: Not on file   Number of children: 6   Years of  education: Not on file   Highest education level: Not on file  Occupational History   Occupation: disabled  Tobacco Use   Smoking status: Some Days    Packs/day: 0.25    Years: 36.00    Pack years: 9.00    Types: Cigarettes    Last attempt to quit: 10/2021    Years since quitting: 0.4   Smokeless tobacco: Never  Vaping Use   Vaping Use: Never used  Substance and Sexual Activity   Alcohol use: Not Currently    Alcohol/week: 14.0 - 21.0 standard drinks    Types: 14 - 21 Standard drinks or equivalent per week    Comment: 2-3 beers/day, with occasional "binges".  No history of withdrawal   Drug use: Yes    Types: Marijuana, Cocaine    Comment: last cocaine February 2023   Sexual activity: Not on file  Other Topics Concern   Not on file  Social History Narrative   Not on file   Social Determinants of Health   Financial Resource Strain: Not on file  Food Insecurity: Not on file  Transportation Needs: Not on file  Physical Activity: Not on file  Stress: Not on file  Social Connections: Not on file   Family History  Problem Relation Age of Onset   Emphysema Mother    Allergies Mother        "everyone in family"   Asthma Mother        "everyone in family"   Heart disease Mother    Clotting disorder Mother    Cancer Mother    Emphysema Father    Allergies Father    Allergies Son    Seizures Brother    Diabetes Brother    Scheduled Meds:  apixaban  5 mg Oral BID   atorvastatin  40 mg Oral Daily   benztropine  0.5 mg Oral QHS   budesonide (PULMICORT) nebulizer solution  0.25 mg Nebulization BID   dextromethorphan-guaiFENesin  1 tablet Oral BID   dorzolamide  1 drop Left Eye BID   doxycycline  100 mg Oral Q12H   gabapentin  100 mg Oral TID   ipratropium-albuterol  3 mL Nebulization Q6H   latanoprost  1 drop Both Eyes QHS   loxapine  10 mg Oral QHS   methylPREDNISolone (SOLU-MEDROL) injection  40 mg Intravenous Q12H   multivitamin with minerals  1 tablet Oral  Daily   pantoprazole  40 mg Oral Daily   sodium chloride flush  3 mL Intravenous Q12H   sodium chloride flush  3 mL Intravenous Q12H   Continuous Infusions:  sodium chloride     PRN Meds:.sodium chloride, acetaminophen **OR** acetaminophen, albuterol, bisacodyl, polyethylene glycol, sodium chloride flush Medications Prior to Admission:  Prior to Admission medications   Medication Sig Start Date End Date Taking? Authorizing Provider  acetaminophen (TYLENOL) 325 MG tablet Take 2 tablets (650 mg total) by mouth every 6 (six) hours as needed for mild pain (or Fever >/= 101). 10/30/21   Tony Hockey, MD  albuterol (PROVENTIL) (2.5 MG/3ML) 0.083% nebulizer solution Take 3 mLs (2.5 mg total) by nebulization every 4 (four) hours as needed for wheezing or shortness of breath. 12/12/21 12/12/22  [name], MD  albuterol (VENTOLIN HFA) 108 (90 Base) MCG/ACT inhaler Inhale 2 puffs into the lungs every 6 (six) hours as needed for wheezing or shortness of breath. 03/15/22   [name], Pratik D, DO  apixaban (ELIQUIS) 5 MG TABS tablet Take 1 tablet (5 mg total) by mouth 2 (two) times daily. 03/22/22 05/21/22  [name], Pratik D, DO  atorvastatin (LIPITOR) 40 MG tablet TAKE ONE TABLET BY MOUTH DAILY Patient taking differently: Take 40 mg by mouth daily. 07/06/20   [name], MD  benztropine (COGENTIN) 0.5 MG tablet Take 0.5 mg by mouth at bedtime.    [provider]  Budeson-Glycopyrrol-Formoterol (BREZTRI AEROSPHERE) 160-9-4.8 MCG/ACT AERO Inhale 2 puffs into the lungs in the morning and at bedtime. 12/12/21   [name], MD  diclofenac Sodium (VOLTAREN) 1 % GEL Apply 2 g topically 4 (four) times daily as needed (pain).    [provider]  dorzolamide (TRUSOPT) 2 % ophthalmic solution Place 1 drop into the left eye 2 (two) times daily. 12/12/20   [provider]  gabapentin (NEURONTIN) 100 MG capsule Take 1 capsule (100 mg total) by mouth 3 (three) times daily. 03/25/22 05/24/22   Deatra James, MD  guaiFENesin 200 MG tablet Take 2 tablets (400 mg total) by mouth every 6 (six) hours as needed for cough or to loosen phlegm. 03/02/22   Orson Eva, MD  latanoprost (XALATAN) 0.005 % ophthalmic solution Place 1 drop into both eyes at bedtime. 05/24/19   [provider]  loxapine (LOXITANE) 10 MG capsule Take 10 mg by mouth at bedtime.     [provider]  mirtazapine (REMERON) 30 MG tablet Take 1 tablet (30 mg total) by mouth at bedtime. 10/30/21   Tony Hockey, MD  Multiple Vitamin (MULTIVITAMIN WITH MINERALS) TABS tablet Take 1 tablet by mouth daily. 10/31/21   Tony Hockey, MD  OXYGEN Inhale 3-4 L into the lungs as directed. 3-4 liters with sleep and exertion as needed for low oxygen    [provider]  pantoprazole (PROTONIX) 40 MG tablet Take 1 tablet (40 mg total) by mouth daily. 10/30/21   Tony Hockey, MD  polyethylene glycol (MIRALAX / GLYCOLAX) 17 g packet TAKE 17 GRAMS BY MOUTH DAILY Patient taking differently: Take 17 g by mouth daily as needed for mild constipation. 03/09/20   [name], MD  predniSONE (DELTASONE) 10 MG tablet Take 4 tablets (40 mg total) by mouth daily for 5 days. 03/29/22 04/03/22  [name], Pratik D, DO  senna-docusate (SENOKOT-S) 8.6-50 MG tablet Take 2 tablets by mouth at bedtime. Patient taking differently: Take 2 tablets by mouth at bedtime as needed for mild constipation. 10/30/21   Tony Hockey, MD  simethicone (MYLICON) 80 MG chewable tablet Chew 1 tablet (80 mg total) by mouth 4 (four) times daily as needed for flatulence. 03/15/22   [name], Pratik D, DO  zolpidem (AMBIEN) 5 MG tablet Take 1 tablet (5 mg total) by mouth at bedtime as needed for up to 20 days for sleep. 03/25/22 04/14/22  Deatra James, MD   Allergies  Allergen Reactions   Penicillins Anaphylaxis    Has patient had a PCN reaction causing immediate rash, facial/tongue/throat swelling, SOB or lightheadedness with hypotension: Yes Has patient  had a PCN reaction causing severe rash involving mucus membranes or skin necrosis: No Has patient had a PCN reaction that required hospitalization No Has patient had a PCN reaction occurring within the last 10 years: No If all of the above answers are "NO", then may proceed with Cephalosporin use.    Levocetirizine Other (See Comments)    Muscle cramps   Review of Systems  Unable to perform ROS: Acuity of condition   Physical Exam Vitals and nursing note reviewed.  Constitutional:      General: He is not in acute distress.    Appearance: He is ill-appearing.  HENT:     Mouth/Throat:     Mouth: Mucous membranes are dry.  Cardiovascular:     Rate and Rhythm: Normal rate.  Pulmonary:     Effort: Respiratory distress present.  Skin:    General: Skin is warm and dry.  Neurological:     General: No focal deficit present.     Mental Status: He is alert.    Vital Signs: BP 118/81 (BP Location: Left Arm)   Pulse 89   Temp 98 F (36.7 C)   Resp 17   Ht 5' 11" (1.803 m)   Wt 58.9 kg   SpO2 100%   BMI 18.11 kg/m  Pain Scale: 0-10   Pain Score: 6    SpO2: SpO2: 100 % O2 Device:SpO2: 100 % O2 Flow Rate: .O2 Flow Rate (L/min): 4  L/min  IO: Intake/output summary: No intake or output data in the 24 hours ending 04/01/22 1357  LBM: Last BM Date : 03/31/22 Baseline Weight: Weight: 62.4 kg Most recent weight: Weight: 58.9 kg     Palliative Assessment/Data:   Flowsheet Rows    Flowsheet Row Most Recent Value  Intake Tab   Referral Department Hospitalist  Unit at Time of Referral Med/Surg Unit  Palliative Care Primary Diagnosis Pulmonary  Date Notified 04/01/22  Palliative Care Type New Palliative care  Reason for referral Clarify Goals of Care  Date of Admission 04/01/22  Date first seen by Palliative Care 04/01/22  # of days Palliative referral response time 0 Day(s)  # of days IP prior to Palliative referral 0  Clinical Assessment   Palliative Performance Scale  Score 30%  Pain Max last 24 hours Not able to report  Pain Min Last 24 hours Not able to report  Dyspnea Max Last 24 Hours Not able to report  Dyspnea Min Last 24 hours Not able to report  Psychosocial & Spiritual Assessment   Palliative Care Outcomes        Time In: 1005 Time Out: 1100 Time Total: 55 minutes  Greater than 50%  of this time was spent counseling and coordinating care related to the above assessment and plan.  Signed by: Drue Novel, NP   Please contact Palliative Medicine Team phone at [phone] for questions and concerns.  For individual provider: See Shea Evans

## 2022-04-02 DIAGNOSIS — C3490 Malignant neoplasm of unspecified part of unspecified bronchus or lung: Secondary | ICD-10-CM | POA: Diagnosis present

## 2022-04-02 DIAGNOSIS — Z825 Family history of asthma and other chronic lower respiratory diseases: Secondary | ICD-10-CM | POA: Diagnosis not present

## 2022-04-02 DIAGNOSIS — R7303 Prediabetes: Secondary | ICD-10-CM | POA: Diagnosis present

## 2022-04-02 DIAGNOSIS — J441 Chronic obstructive pulmonary disease with (acute) exacerbation: Secondary | ICD-10-CM | POA: Diagnosis present

## 2022-04-02 DIAGNOSIS — Z833 Family history of diabetes mellitus: Secondary | ICD-10-CM | POA: Diagnosis not present

## 2022-04-02 DIAGNOSIS — F17210 Nicotine dependence, cigarettes, uncomplicated: Secondary | ICD-10-CM | POA: Diagnosis present

## 2022-04-02 DIAGNOSIS — K219 Gastro-esophageal reflux disease without esophagitis: Secondary | ICD-10-CM | POA: Diagnosis present

## 2022-04-02 DIAGNOSIS — Z888 Allergy status to other drugs, medicaments and biological substances status: Secondary | ICD-10-CM | POA: Diagnosis not present

## 2022-04-02 DIAGNOSIS — Z79899 Other long term (current) drug therapy: Secondary | ICD-10-CM | POA: Diagnosis not present

## 2022-04-02 DIAGNOSIS — Z86711 Personal history of pulmonary embolism: Secondary | ICD-10-CM | POA: Diagnosis not present

## 2022-04-02 DIAGNOSIS — Z832 Family history of diseases of the blood and blood-forming organs and certain disorders involving the immune mechanism: Secondary | ICD-10-CM | POA: Diagnosis not present

## 2022-04-02 DIAGNOSIS — J9622 Acute and chronic respiratory failure with hypercapnia: Secondary | ICD-10-CM | POA: Diagnosis present

## 2022-04-02 DIAGNOSIS — Z7901 Long term (current) use of anticoagulants: Secondary | ICD-10-CM | POA: Diagnosis not present

## 2022-04-02 DIAGNOSIS — F32A Depression, unspecified: Secondary | ICD-10-CM | POA: Diagnosis present

## 2022-04-02 DIAGNOSIS — I444 Left anterior fascicular block: Secondary | ICD-10-CM | POA: Diagnosis present

## 2022-04-02 DIAGNOSIS — Z7189 Other specified counseling: Secondary | ICD-10-CM | POA: Diagnosis not present

## 2022-04-02 DIAGNOSIS — I248 Other forms of acute ischemic heart disease: Secondary | ICD-10-CM | POA: Diagnosis present

## 2022-04-02 DIAGNOSIS — E785 Hyperlipidemia, unspecified: Secondary | ICD-10-CM | POA: Diagnosis present

## 2022-04-02 DIAGNOSIS — Z880 Allergy status to penicillin: Secondary | ICD-10-CM | POA: Diagnosis not present

## 2022-04-02 DIAGNOSIS — F419 Anxiety disorder, unspecified: Secondary | ICD-10-CM | POA: Diagnosis present

## 2022-04-02 DIAGNOSIS — J9621 Acute and chronic respiratory failure with hypoxia: Secondary | ICD-10-CM | POA: Diagnosis present

## 2022-04-02 DIAGNOSIS — Z8249 Family history of ischemic heart disease and other diseases of the circulatory system: Secondary | ICD-10-CM | POA: Diagnosis not present

## 2022-04-02 DIAGNOSIS — Z515 Encounter for palliative care: Secondary | ICD-10-CM | POA: Diagnosis not present

## 2022-04-02 DIAGNOSIS — Z91199 Patient's noncompliance with other medical treatment and regimen due to unspecified reason: Secondary | ICD-10-CM | POA: Diagnosis not present

## 2022-04-02 DIAGNOSIS — I10 Essential (primary) hypertension: Secondary | ICD-10-CM | POA: Diagnosis present

## 2022-04-02 LAB — CBC
HCT: 38 % — ABNORMAL LOW (ref 39.0–52.0)
Hemoglobin: 11.6 g/dL — ABNORMAL LOW (ref 13.0–17.0)
MCH: 31.5 pg (ref 26.0–34.0)
MCHC: 30.5 g/dL (ref 30.0–36.0)
MCV: 103.3 fL — ABNORMAL HIGH (ref 80.0–100.0)
Platelets: 183 10*3/uL (ref 150–400)
RBC: 3.68 MIL/uL — ABNORMAL LOW (ref 4.22–5.81)
RDW: 15.1 % (ref 11.5–15.5)
WBC: 13.3 10*3/uL — ABNORMAL HIGH (ref 4.0–10.5)
nRBC: 0 % (ref 0.0–0.2)

## 2022-04-02 LAB — BASIC METABOLIC PANEL
Anion gap: 6 (ref 5–15)
BUN: 19 mg/dL (ref 6–20)
CO2: 33 mmol/L — ABNORMAL HIGH (ref 22–32)
Calcium: 8.6 mg/dL — ABNORMAL LOW (ref 8.9–10.3)
Chloride: 101 mmol/L (ref 98–111)
Creatinine, Ser: 0.65 mg/dL (ref 0.61–1.24)
GFR, Estimated: >60 mL/min (ref >60)
Glucose, Bld: 151 mg/dL — ABNORMAL HIGH (ref 70–99)
Potassium: 3.5 mmol/L (ref 3.5–5.1)
Sodium: 140 mmol/L (ref 135–145)

## 2022-04-02 MED ORDER — ALBUTEROL SULFATE (2.5 MG/3ML) 0.083% IN NEBU: 2.5000 mg | INHALATION_SOLUTION | RESPIRATORY_TRACT | Status: DC | PRN

## 2022-04-02 NOTE — TOC Initial Note (Signed)
Transition of Care [facility]) - Initial/Assessment Note    Patient Details  Name: Tony Sullivan MRN: [id number] Date of Birth: [birth year]  Transition of Care [facility]) CM/SW Contact:    Shade Flood, LCSW Phone Number: 04/02/2022, 12:38 PM  Clinical Narrative:                  Pt readmitted after recent dc from [facility]. Pt well known to TOC from previous admissions. TOC has attempted LTC NH placement in the past at pt's request and then he changed his mind. During last admission, TOC was working on NIV arrangements. Pt dc'd and [employer] could not reach him to make arrangements to deliver the NIV.   At this time, pt now saying that he wants to go to LTC NH again. Spoke with pt at bedside and he states he is aware that he will have to convert his Medicaid and sign over his check to the NH. LTC provider options reviewed. Pt agreeable to broad geographic search for placement.   Updated Caryl Pina at [place]. If pt changes his mind again and decides to go home, [employer] has the NIV available and can deliver to pt at dc.  TOC will follow.  Expected Discharge Plan: Long Term Nursing Home Barriers to Discharge: SNF Pending bed offer   Patient Goals and CMS Choice Patient states their goals for this hospitalization and ongoing recovery are:: go to LTC CMS Medicare.gov Compare Post Acute Care list provided to:: Patient Choice offered to / list presented to : Patient  Expected Discharge Plan and Services Expected Discharge Plan: Long Term Nursing Home In-house Referral: Clinical Social Work   Post Acute Care Choice: Nursing Home Living arrangements for the past 2 months: [place]                                      Prior Living Arrangements/Services Living arrangements for the past 2 months: Single Family Home Lives with:: Self Patient language and need for interpreter reviewed:: Yes Do you feel safe going back to the place where you live?: Yes      Need for Family Participation  in Patient Care: Yes (Comment)   Current home services: DME Criminal Activity/Legal Involvement Pertinent to Current Situation/Hospitalization: No - Comment as needed  Activities of Daily Living Home Assistive Devices/Equipment: Wheelchair ADL Screening (condition at time of admission) Patient's cognitive ability adequate to safely complete daily activities?: Yes Is the patient deaf or have difficulty hearing?: No Does the patient have difficulty seeing, even when wearing glasses/contacts?: No Does the patient have difficulty concentrating, remembering, or making decisions?: No Patient able to express need for assistance with ADLs?: Yes Does the patient have difficulty dressing or bathing?: Yes Independently performs ADLs?: No Dressing (OT): Needs assistance Is this a change from baseline?: Pre-admission baseline Grooming: Needs assistance Is this a change from baseline?: Pre-admission baseline Feeding: Independent Is this a change from baseline?: Pre-admission baseline Bathing: Needs assistance Is this a change from baseline?: Pre-admission baseline Toileting: Needs assistance Is this a change from baseline?: Pre-admission baseline In/Out Bed: Needs assistance Is this a change from baseline?: Pre-admission baseline Walks in Home: Needs assistance Is this a change from baseline?: Pre-admission baseline Does the patient have difficulty walking or climbing stairs?: Yes Weakness of Legs: Both Weakness of Arms/Hands: Both  Permission Sought/Granted  Emotional Assessment     Affect (typically observed): Pleasant Orientation: : Oriented to Self, Oriented to Place, Oriented to  Time, Oriented to Situation Alcohol / Substance Use: Not Applicable Psych Involvement: No (comment)  Admission diagnosis:  Acute exacerbation of chronic obstructive pulmonary disease (COPD) ([place]) [J44.1] COPD exacerbation ([place]) [J44.1] Patient Active Problem List   Diagnosis Date  Noted   Squamous cell lung cancer 04/01/2022   H/o Pulmonary embolism ([place]) 04/01/2022   Mass of upper lobe of left lung    Thoracic compression fracture ([place]) 03/20/2022   Hemoptysis 03/20/2022   Pneumonia 03/13/2022   COPD exacerbation ([place]) 02/27/2022   Pedal edema 02/19/2022   CAP (community acquired pneumonia) 01/07/2022   Prolonged QT interval 01/07/2022   Glaucoma 01/07/2022   Elevated troponin 10/27/2021   Hyperglycemia 10/27/2021   Elevated MCV 10/27/2021   Respiratory failure with hypoxia and hypercapnia ([place]) 10/27/2021   Acute exacerbation of chronic obstructive pulmonary disease (COPD) ([place]) 10/02/2021   Hypophosphatemia 10/02/2021   Hypoalbuminemia due to protein-calorie malnutrition ([place]) 10/02/2021   Physical deconditioning 07/26/2021   Numbness and tingling in left arm 05/06/2021   Leukocytosis 05/05/2021   Severe headache 05/05/2021   Mixed hyperlipidemia 05/24/2020   Hypokalemia 05/24/2020   Chest pain 05/23/2020   Bipolar disorder ([place]) 05/23/2020   HNP (herniated nucleus pulposus), cervical 09/06/2019   Abdominal pain    Poor dentition 11/04/2017   Acute on chronic respiratory failure with hypercapnia ([place]) 08/29/2017   ETOH abuse 03/30/2017   Cigarette smoker 03/30/2017   Acute respiratory failure with hypoxia and hypercapnia (HCC) 03/29/2017   Special screening for malignant neoplasms, colon    Benign neoplasm of ascending colon    Benign neoplasm of descending colon    Hoarseness 08/22/2016   GERD (gastroesophageal reflux disease) 08/22/2016   Atypical chest pain 12/05/2015   Essential hypertension 06/15/2014   Pectoralis muscle strain 01/11/2014   COPD with acute exacerbation ([place]) 01/10/2014   COPD (chronic obstructive pulmonary disease) ([place]) 01/08/2014   Tobacco abuse 01/08/2014   Cough 01/08/2014   Chronic respiratory failure with hypoxia ([place]) 01/08/2014   PCP:  [name], FNP Pharmacy:   [facility] [phone] -  [place], [place] - [address] AT [place]. [place] [address] Phone: [phone] Fax: [phone]     Social Determinants of Health (SDOH) Interventions    Readmission Risk Interventions    03/14/2022    3:54 PM 02/28/2022   10:40 AM 09/08/2019    3:22 PM  Readmission Risk Prevention Plan  Transportation Screening Complete Complete Complete  PCP or Specialist Appt within 3-5 Days   Complete  HRI or [place]   Complete  Social Work Consult for [place] Planning/Counseling   Complete  Palliative Care Screening   Not Applicable  Medication Review Press photographer) Complete Complete Complete  PCP or Specialist appointment within 3-5 days of discharge Not Complete    HRI or [place] Complete Complete   SW Recovery Care/Counseling Consult Complete Complete   Palliative Care Screening Not Applicable Not [place] Patient Refused Not Applicable

## 2022-04-02 NOTE — Plan of Care (Signed)

## 2022-04-02 NOTE — TOC Progression Note (Signed)
Transition of Care [facility]) - Progression Note    Patient Details  Name: Tony Sullivan MRN: [id number] Date of Birth: 1961-07-01  Transition of Care [facility]) CM/SW Contact  Shade Flood, LCSW Phone Number: 04/02/2022, 3:16 PM  Clinical Narrative:     TOC following. Pt expressing continued interest in LTC NH placement. TOC team working most of the day to find placement. Bed offer from [facility] extended to pt. Pt at the moment is agreeable. PASRR pending. NH would need to order bipap.   Anticipating dc in 1-2 days.  Expected Discharge Plan: Long Term Nursing Home Barriers to Discharge: SNF Pending bed offer  Expected Discharge Plan and Services Expected Discharge Plan: [place] In-house Referral: Clinical Social Work   Post Acute Care Choice: Nursing Home Living arrangements for the past 2 months: [place] Determinants of Health (SDOH) Interventions    Readmission Risk Interventions    03/14/2022    3:54 PM 02/28/2022   10:40 AM 09/08/2019    3:22 PM  Readmission Risk Prevention Plan  Transportation Screening Complete Complete Complete  PCP or Specialist Appt within 3-5 Days   Complete  HRI or [place]   Complete  Social Work Consult for [place] Planning/Counseling   Complete  Palliative Care Screening   Not Applicable  Medication Review Press photographer) Complete Complete Complete  PCP or Specialist appointment within 3-5 days of discharge Not Complete    HRI or [place] Complete Complete   SW Recovery Care/Counseling Consult Complete Complete   Palliative Care Screening Not Applicable Not [place] Patient Refused Not Applicable

## 2022-04-02 NOTE — [place] FL2 (Signed)
[place] MEDICAID FL2 LEVEL OF CARE SCREENING TOOL     IDENTIFICATION  Patient Name: Tony Sullivan Birthdate: Feb 26, 1961 Sex: male Admission Date (Current Location): 04/01/2022  [facility] and [place] Number:  [employer] and Address:  [place] [address] [place]      Provider Number: [id number]  Attending Physician Name and Address:  [name], MD  Relative Name and Phone Number:       Current Level of Care: Hospital Recommended Level of Care: [place] Prior Approval Number:    Date Approved/Denied:   PASRR Number:    Discharge Plan: SNF    Current Diagnoses: Patient Active Problem List   Diagnosis Date Noted   Squamous cell lung cancer 04/01/2022   H/o Pulmonary embolism ([place]) 04/01/2022   Mass of upper lobe of left lung    Thoracic compression fracture ([place]) 03/20/2022   Hemoptysis 03/20/2022   Pneumonia 03/13/2022   COPD exacerbation ([place]) 02/27/2022   Pedal edema 02/19/2022   CAP (community acquired pneumonia) 01/07/2022   Prolonged QT interval 01/07/2022   Glaucoma 01/07/2022   Elevated troponin 10/27/2021   Hyperglycemia 10/27/2021   Elevated MCV 10/27/2021   Respiratory failure with hypoxia and hypercapnia ([place]) 10/27/2021   Acute exacerbation of chronic obstructive pulmonary disease (COPD) ([place]) 10/02/2021   Hypophosphatemia 10/02/2021   Hypoalbuminemia due to protein-calorie malnutrition ([place]) 10/02/2021   Physical deconditioning 07/26/2021   Numbness and tingling in left arm 05/06/2021   Leukocytosis 05/05/2021   Severe headache 05/05/2021   Mixed hyperlipidemia 05/24/2020   Hypokalemia 05/24/2020   Chest pain 05/23/2020   Bipolar disorder ([place]) 05/23/2020   HNP (herniated nucleus pulposus), cervical 09/06/2019   Abdominal pain    Poor dentition 11/04/2017   Acute on chronic respiratory failure with hypercapnia ([place]) 08/29/2017   ETOH abuse 03/30/2017   Cigarette smoker  03/30/2017   Acute respiratory failure with hypoxia and hypercapnia (H[facility]) 03/29/2017   Special screening for malignant neoplasms, colon    Benign neoplasm of ascending colon    Benign neoplasm of descending colon    Hoarseness 08/22/2016   GERD (gastroesophageal reflux disease) 08/22/2016   Atypical chest pain 12/05/2015   Essential hypertension 06/15/2014   Pectoralis muscle strain 01/11/2014   COPD with acute exacerbation ([place]) 01/10/2014   COPD (chronic obstructive pulmonary disease) ([place]) 01/08/2014   Tobacco abuse 01/08/2014   Cough 01/08/2014   Chronic respiratory failure with hypoxia ([place]) 01/08/2014    Orientation RESPIRATION BLADDER Height & Weight     Self, Time, Situation, Place  O2 (4L) Continent Weight: 129 lb 13.6 oz (58.9 kg) Height:  5\' 11"  (180.3 cm)  BEHAVIORAL SYMPTOMS/MOOD NEUROLOGICAL BOWEL NUTRITION STATUS      Continent Diet (see dc summary)  AMBULATORY STATUS COMMUNICATION OF NEEDS Skin   Limited Assist Verbally Normal                       Personal Care Assistance Level of Assistance    Bathing Assistance: Limited assistance Feeding assistance: Independent Dressing Assistance: Limited assistance     Functional Limitations Info    Sight Info: Impaired Hearing Info: Adequate Speech Info: Adequate    SPECIAL CARE FACTORS FREQUENCY                       Contractures Contractures Info: Not present    Additional Factors Info    Code Status Info: Full Allergies Info: Penicillins, Levocetirizine  Current Medications (04/02/2022):  This is the current hospital active medication list Current Facility-Administered Medications  Medication Dose Route Frequency Provider Last Rate Last Admin   0.9 %  sodium chloride infusion  250 mL Intravenous PRN Emokpae, Courage, MD       acetaminophen (TYLENOL) tablet 650 mg  650 mg Oral Q6H PRN Emokpae, Courage, MD       Or   acetaminophen (TYLENOL) suppository 650 mg  650 mg Rectal Q6H  PRN Emokpae, Courage, MD       albuterol (PROVENTIL) (2.5 MG/3ML) 0.083% nebulizer solution 2.5 mg  2.5 mg Nebulization Q4H PRN Emokpae, Courage, MD       apixaban (ELIQUIS) tablet 5 mg  5 mg Oral BID Emokpae, Courage, MD   5 mg at 04/02/22 0952   atorvastatin (LIPITOR) tablet 40 mg  40 mg Oral Daily Emokpae, Courage, MD   40 mg at 04/02/22 0952   benztropine (COGENTIN) tablet 0.5 mg  0.5 mg Oral QHS Emokpae, Courage, MD   0.5 mg at 04/01/22 2137   bisacodyl (DULCOLAX) suppository 10 mg  10 mg Rectal Daily PRN Emokpae, Courage, MD       budesonide (PULMICORT) nebulizer solution 0.25 mg  0.25 mg Nebulization BID Adefeso, Oladapo, DO   0.25 mg at 04/02/22 0750   dextromethorphan-guaiFENesin (MUCINEX DM) 30-600 MG per 12 hr tablet 1 tablet  1 tablet Oral BID Denton Brick, Courage, MD   1 tablet at 04/02/22 0952   dorzolamide (TRUSOPT) 2 % ophthalmic solution 1 drop  1 drop Left Eye BID [name], MD   1 drop at 04/02/22 0952   doxycycline (VIBRA-TABS) tablet 100 mg  100 mg Oral Q12H Emokpae, Courage, MD   100 mg at 04/02/22 0952   gabapentin (NEURONTIN) capsule 100 mg  100 mg Oral TID Denton Brick, Courage, MD   100 mg at 04/02/22 0952   ipratropium-albuterol (DUONEB) 0.5-2.5 (3) MG/3ML nebulizer solution 3 mL  3 mL Nebulization TID Emokpae, Courage, MD   3 mL at 04/02/22 0750   latanoprost (XALATAN) 0.005 % ophthalmic solution 1 drop  1 drop Both Eyes QHS Emokpae, Courage, MD   1 drop at 04/01/22 2148   loxapine (LOXITANE) capsule 10 mg  10 mg Oral QHS Emokpae, Courage, MD   10 mg at 04/01/22 2143   methylPREDNISolone sodium succinate (SOLU-MEDROL) 40 mg/mL injection 40 mg  40 mg Intravenous Q12H Emokpae, Courage, MD   40 mg at 04/02/22 1132   multivitamin with minerals tablet 1 tablet  1 tablet Oral Daily Emokpae, Courage, MD   1 tablet at 04/02/22 0952   nicotine (NICODERM CQ - dosed in mg/24 hours) patch 14 mg  14 mg Transdermal Daily Emokpae, Courage, MD       pantoprazole (PROTONIX) EC tablet 40 mg   40 mg Oral Daily Emokpae, Courage, MD   40 mg at 04/02/22 0952   polyethylene glycol (MIRALAX / GLYCOLAX) packet 17 g  17 g Oral Daily PRN Emokpae, Courage, MD       sodium chloride flush (NS) 0.9 % injection 3 mL  3 mL Intravenous Q12H Emokpae, Courage, MD   3 mL at 04/02/22 0953   sodium chloride flush (NS) 0.9 % injection 3 mL  3 mL Intravenous Q12H Emokpae, Courage, MD   3 mL at 04/02/22 0952   sodium chloride flush (NS) 0.9 % injection 3 mL  3 mL Intravenous PRN [name], MD         Discharge Medications: Please see discharge summary  for a list of discharge medications.  Relevant Imaging Results:  Relevant Lab Results:   Additional Information SSN: [id number], Pt needs bipap for sleep  Shade Flood, LCSW

## 2022-04-02 NOTE — Progress Notes (Signed)
PROGRESS NOTE     Tony Sullivan, is a 61 y.o. male, DOB - 11-Jan-1961, [id number]  Admit date - 04/01/2022   Admitting Physician Dariel Pellecchia Denton Brick, MD  Outpatient Primary MD for the patient is Tony Drafts, FNP  LOS - 0  Chief Complaint  Patient presents with   Respiratory Distress        Brief Narrative:  61 y.o. male with past medical history relevant for chronic hypoxic and hypercapnic respiratory failure in the setting of underlying COPD and ongoing tobacco abuse, GERD, HTN, HLD who is on chronic anticoagulation who was discharged on 03/28/2022, readmitted on 04/02/2019 with COPD flareup    -Assessment and Plan: 1) acute COPD exacerbation--required BiPAP -Continue BiPAP nightly -Continue steroids, antibiotics and bronchodilators and mucolytics as ordered -Cough, dyspnea and work of breathing continues to improve   2) acute on chronic hypoxic and hypercapnic respiratory failure -Secondary to #1 above -Manage as above #1 -Patient is in the process of getting trilogy/NIV machine which she can get once he gets out of SNF -Okay to discharge to SNF/LTC if facility has a BiPAP machine   3) social/ethics--recurrent admissions to the hospital, multiple comorbid conditions palliative care consult appreciated  -patient remains full code without limitations to treatment   4) squamous cell lung cancer--- diagnosed by bronchoscopy on 03/22/2022 after presenting with hemoptysis and endobronchial mass outpatient PET scan pending -He will need to follow-up with Dr. 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 Barriers: SNF /LTC facility  Code Status :  -  Code Status: Full Code   Family Communication:    NA (patient is alert, awake and coherent)   DVT Prophylaxis  :   - SCDs  SCDs Start: 04/01/22 0852 Place TED hose Start: 04/01/22 0852 apixaban (ELIQUIS) tablet 5 mg   Lab Results  Component Value Date   PLT 183 04/02/2022    Inpatient Medications  Scheduled Meds:  apixaban  5 mg Oral BID   atorvastatin  40 mg Oral Daily   benztropine  0.5 mg Oral QHS   budesonide (PULMICORT) nebulizer solution  0.25 mg Nebulization BID   dextromethorphan-guaiFENesin  1 tablet Oral BID   dorzolamide  1 drop Left Eye BID   doxycycline  100 mg Oral Q12H   gabapentin  100 mg Oral TID   ipratropium-albuterol  3 mL Nebulization TID   latanoprost  1 drop Both Eyes QHS   loxapine  10 mg Oral QHS   methylPREDNISolone (SOLU-MEDROL) injection  40 mg Intravenous Q12H   multivitamin with minerals  1 tablet Oral Daily   nicotine  14 mg Transdermal  Daily   pantoprazole  40 mg Oral Daily   sodium chloride flush  3 mL Intravenous Q12H   sodium chloride flush  3 mL Intravenous Q12H   Continuous Infusions:  sodium chloride     PRN Meds:.sodium chloride, acetaminophen **OR** acetaminophen, albuterol, bisacodyl, polyethylene glycol, sodium chloride flush   Anti-infectives (From admission, onward)    Start     Dose/Rate Route Frequency Ordered Stop   04/01/22 1100  doxycycline (VIBRA-TABS) tablet 100 mg        100 mg Oral Every 12 hours 04/01/22 0854           Subjective: Gwenyth Bouillon today has no fevers, no emesis,  No chest pain,   -Cough dyspnea and work of  breathing improving -  Objective: Vitals:   04/02/22 0512 04/02/22 0752 04/02/22 1345 04/02/22 1402  BP: (!) 182/113  (!) 152/93   Pulse: 82  81   Resp: 19     Temp: 98 F (36.7 C)  97.8 F (36.6 C)   TempSrc:   Oral   SpO2: 100% 99% 97% 96%  Weight:      Height:        Intake/Output Summary (Last 24 hours) at 04/02/2022 2004 Last data filed at 04/02/2022 1300 Gross per 24 hour  Intake 480 ml  Output --  Net 480 ml   Filed Weights   04/01/22 0142 04/01/22 0833  Weight: 62.4 kg 58.9 kg    Physical Exam  Gen:- Awake Alert, chronically ill-appearing HEENT:- [place].AT, No sclera icterus Nose- 3L/min Neck-Supple Neck,No JVD,.  Lungs-improved air movement, no significant wheezing CV- S1, S2 normal, regular  Abd-  +ve B.Sounds, Abd Soft, No tenderness,    Extremity/Skin:- No  edema, pedal pulses present  Psych-affect is appropriate, oriented x3 Neuro-generalized weakness, no new focal deficits, no tremors  Data Reviewed: I have personally reviewed following labs and imaging studies  CBC: Recent Labs  Lab 03/28/22 1016 03/29/22 0427 04/01/22 0208 04/02/22 0445  WBC 13.1* 10.8* 12.6* 13.3*  NEUTROABS 11.7*  --   --   --   HGB 13.1 10.9* 13.6 11.6*  HCT 42.8 34.7* 43.9 38.0*  MCV 103.9* 102.1* 102.6* 103.3*  PLT 172 171 165 196   Basic Metabolic Panel: Recent Labs  Lab 03/28/22 1016 03/29/22 0427 04/01/22 0208 04/02/22 0445  NA 144 143 139 140  K 4.3 3.8 4.8 3.5  CL 100 101 100 101  CO2 35* 35* 32 33*  GLUCOSE 110* 85 77 151*  BUN 19 20 11 19   CREATININE 0.84 0.77 0.79 0.65  CALCIUM 9.2 8.6* 8.6* 8.6*  MG  --  2.6*  --   --    GFR: Estimated Creatinine Clearance: 83.9 mL/min (by C-G formula based on SCr of 0.65 mg/dL). Liver Function Tests: No results for input(s): AST, ALT, ALKPHOS, BILITOT, PROT, ALBUMIN in the last 168 hours. Cardiac Enzymes: No results for input(s): CKTOTAL, CKMB, CKMBINDEX, TROPONINI in the last 168 hours. BNP (last 3 results) No  results for input(s): PROBNP in the last 8760 hours. HbA1C: No results for input(s): HGBA1C in the last 72 hours. Sepsis Labs: @LABRCNTIP (procalcitonin:4,lacticidven:4) ) Recent Results (from the past 240 hour(s))  MRSA Next Gen by PCR, Nasal     Status: None   Collection Time: 03/29/22  4:30 AM   Specimen: Nasal Mucosa; Nasal Swab  Result Value Ref Range Status   MRSA by PCR Next Gen NOT DETECTED NOT DETECTED Final    Comment: (NOTE) The GeneXpert MRSA  Assay (FDA approved for NASAL specimens only), is one component of a comprehensive MRSA colonization surveillance program. It is not intended to diagnose MRSA infection nor to guide or monitor treatment for MRSA infections. Test performance is not FDA approved in patients less than 9 years old. Performed at [facility], [address], [place], [place] [address]       Radiology Studies: DG Chest Portable 1 View  Result Date: 04/01/2022 CLINICAL DATA:  Chest pain and shortness of breath. EXAM: PORTABLE CHEST 1 VIEW COMPARISON:  March 28, 2022 FINDINGS: The heart size and mediastinal contours are within normal limits. Lungs are hyperinflated with flattening of the diaphragms and evidence of moderate severity emphysematous lung disease. There is no evidence of focal consolidation, pleural effusion or pneumothorax. A radiopaque fusion plate and screws are seen overlying the cervical spine. Chronic left-sided rib fractures are noted. IMPRESSION: COPD without evidence of acute or active cardiopulmonary disease. Electronically Signed   By: Virgina Norfolk M.D.   On: 04/01/2022 02:24     Scheduled Meds:  apixaban  5 mg Oral BID   atorvastatin  40 mg Oral Daily   benztropine  0.5 mg Oral QHS   budesonide (PULMICORT) nebulizer solution  0.25 mg Nebulization BID   dextromethorphan-guaiFENesin  1 tablet Oral BID   dorzolamide  1 drop Left Eye BID   doxycycline  100 mg Oral Q12H   gabapentin  100 mg Oral TID   ipratropium-albuterol  3 mL  Nebulization TID   latanoprost  1 drop Both Eyes QHS   loxapine  10 mg Oral QHS   methylPREDNISolone (SOLU-MEDROL) injection  40 mg Intravenous Q12H   multivitamin with minerals  1 tablet Oral Daily   nicotine  14 mg Transdermal Daily   pantoprazole  40 mg Oral Daily   sodium chloride flush  3 mL Intravenous Q12H   sodium chloride flush  3 mL Intravenous Q12H   Continuous Infusions:  sodium chloride       LOS: 0 days    [name] M.D on 04/02/2022 at 8:04 PM  Go to [email or web address] - for contact info  [facility] - Office  [phone]  If 7PM-7AM, please contact night-coverage [email or web address] Password [email or web address] 04/02/2022, 8:04 PM

## 2022-04-03 ENCOUNTER — Encounter (HOSPITAL_COMMUNITY): Payer: Self-pay

## 2022-04-03 DIAGNOSIS — F17210 Nicotine dependence, cigarettes, uncomplicated: Secondary | ICD-10-CM

## 2022-04-03 DIAGNOSIS — I10 Essential (primary) hypertension: Secondary | ICD-10-CM

## 2022-04-03 DIAGNOSIS — Z7189 Other specified counseling: Secondary | ICD-10-CM | POA: Diagnosis not present

## 2022-04-03 DIAGNOSIS — Z515 Encounter for palliative care: Secondary | ICD-10-CM | POA: Diagnosis not present

## 2022-04-03 DIAGNOSIS — J441 Chronic obstructive pulmonary disease with (acute) exacerbation: Secondary | ICD-10-CM | POA: Diagnosis not present

## 2022-04-03 MED ORDER — HYDROXYZINE HCL 25 MG PO TABS
25.0000 mg | ORAL_TABLET | Freq: Three times a day (TID) | ORAL | Status: DC | PRN
  Administered 2022-04-03 – 2022-04-04 (×3): 25 mg via ORAL
  Filled 2022-04-03 (×3): qty 1

## 2022-04-03 MED ORDER — FUROSEMIDE 20 MG PO TABS
20.0000 mg | ORAL_TABLET | Freq: Every day | ORAL | Status: DC
  Administered 2022-04-04 – 2022-04-05 (×2): 20 mg via ORAL
  Filled 2022-04-03 (×2): qty 1

## 2022-04-03 MED ORDER — SIMETHICONE 80 MG PO CHEW
160.0000 mg | CHEWABLE_TABLET | Freq: Four times a day (QID) | ORAL | Status: DC | PRN
  Administered 2022-04-03 – 2022-04-05 (×3): 160 mg via ORAL
  Filled 2022-04-03 (×3): qty 2

## 2022-04-03 NOTE — Progress Notes (Signed)
PROGRESS NOTE     Tony Sullivan, is a 61 y.o. male, DOB - 01/08/1961, [id number]  Admit date - 04/01/2022   Admitting Physician Courage Denton Brick, MD  Outpatient Primary MD for the patient is [name], FNP  LOS - 1  Chief Complaint  Patient presents with   Respiratory Distress        Brief Narrative:  61 y.o. male with past medical history relevant for chronic hypoxic and hypercapnic respiratory failure in the setting of underlying COPD and ongoing tobacco abuse, GERD, HTN, HLD who is on chronic anticoagulation who was discharged on 03/28/2022, readmitted on 04/02/2019 with COPD flareup    -Assessment and Plan: 1) acute COPD exacerbation--required BiPAP -Continue BiPAP nightly -Continue steroids, antibiotics and bronchodilators and mucolytics as ordered -Cough, dyspnea and work of breathing continues to improve   2) acute on chronic hypoxic and hypercapnic respiratory failure -Secondary to #1 above -Manage as above #1 -Patient is in the process of getting trilogy/NIV machine which she can get once he gets out of SNF -Okay to discharge to SNF/LTC if facility has a BiPAP machine   3) social/ethics--recurrent admissions to the hospital, multiple comorbid conditions palliative care consult appreciated  -patient remains full code without limitations to treatment   4) squamous cell lung cancer--- diagnosed by bronchoscopy on 03/22/2022 after presenting with hemoptysis and endobronchial mass outpatient PET scan pending -He will need to follow-up with Dr. 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 Barriers: SNF /LTC facility  Code Status :  -  Code Status: Full Code   Family Communication:    NA (patient is alert, awake and coherent)   DVT Prophylaxis  :   - SCDs  SCDs Start: 04/01/22 0852 Place TED hose Start: 04/01/22 0852 apixaban (ELIQUIS) tablet 5 mg   Lab Results  Component Value Date   PLT 183 04/02/2022    Inpatient Medications  Scheduled Meds:  apixaban  5 mg Oral BID   atorvastatin  40 mg Oral Daily   benztropine  0.5 mg Oral QHS   budesonide (PULMICORT) nebulizer solution  0.25 mg Nebulization BID   dextromethorphan-guaiFENesin  1 tablet Oral BID   dorzolamide  1 drop Left Eye BID   doxycycline  100 mg Oral Q12H   gabapentin  100 mg Oral TID   ipratropium-albuterol  3 mL Nebulization TID   latanoprost  1 drop Both Eyes QHS   loxapine  10 mg Oral QHS   methylPREDNISolone (SOLU-MEDROL) injection  40 mg Intravenous Q12H   multivitamin with minerals  1 tablet Oral Daily   nicotine  14 mg Transdermal  Daily   pantoprazole  40 mg Oral Daily   sodium chloride flush  3 mL Intravenous Q12H   sodium chloride flush  3 mL Intravenous Q12H   Continuous Infusions:  sodium chloride     PRN Meds:.sodium chloride, acetaminophen **OR** acetaminophen, albuterol, bisacodyl, hydrOXYzine, polyethylene glycol, simethicone, sodium chloride flush   Anti-infectives (From admission, onward)    Start     Dose/Rate Route Frequency Ordered Stop   04/01/22 1100  doxycycline (VIBRA-TABS) tablet 100 mg        100 mg Oral Every 12 hours 04/01/22 0854           Subjective: Tony Sullivan denies any worsening shortness of breath.  Feels that his  breathing is currently near baseline. -  Objective: Vitals:   04/03/22 0824 04/03/22 1037 04/03/22 1412 04/03/22 1454  BP:  (!) 161/94 (!) 168/99   Pulse:  80 95   Resp:  (!) 22 18   Temp:  97.7 F (36.5 C) 97.9 F (36.6 C)   TempSrc:  Oral    SpO2: 100% 100% 100% 99%  Weight:      Height:        Intake/Output Summary (Last 24 hours) at 04/03/2022 1937 Last data filed at 04/03/2022 1300 Gross per 24 hour  Intake 480 ml  Output 2 ml  Net 478 ml   Filed Weights   04/01/22 0142 04/01/22 0833  Weight: 62.4 kg 58.9 kg    Physical Exam  Gen:- Awake Alert, chronically ill-appearing HEENT:- [place].AT, No sclera icterus Nose- 5L/min Neck-Supple Neck,No JVD,.  Lungs-diminished breath sounds with mild wheeze bilaterally CV- S1, S2 normal, regular  Abd-  +ve B.Sounds, Abd Soft, No tenderness,    Extremity/Skin:-1+ edema, pedal pulses present  Psych-affect is appropriate, oriented x3 Neuro-generalized weakness, no new focal deficits, no tremors  Data Reviewed: I have personally reviewed following labs and imaging studies  CBC: Recent Labs  Lab 03/28/22 1016 03/29/22 0427 04/01/22 0208 04/02/22 0445  WBC 13.1* 10.8* 12.6* 13.3*  NEUTROABS 11.7*  --   --   --   HGB 13.1 10.9* 13.6 11.6*  HCT 42.8 34.7* 43.9 38.0*  MCV 103.9* 102.1* 102.6* 103.3*  PLT 172 171 165 400   Basic Metabolic Panel: Recent Labs  Lab 03/28/22 1016 03/29/22 0427 04/01/22 0208 04/02/22 0445  NA 144 143 139 140  K 4.3 3.8 4.8 3.5  CL 100 101 100 101  CO2 35* 35* 32 33*  GLUCOSE 110* 85 77 151*  BUN 19 20 11 19   CREATININE 0.84 0.77 0.79 0.65  CALCIUM 9.2 8.6* 8.6* 8.6*  MG  --  2.6*  --   --    GFR: Estimated Creatinine Clearance: 83.9 mL/min (by C-G formula based on SCr of 0.65 mg/dL). Liver Function Tests: No results for input(s): AST, ALT, ALKPHOS, BILITOT, PROT, ALBUMIN in the last 168 hours. Cardiac Enzymes: No results for input(s): CKTOTAL, CKMB, CKMBINDEX, TROPONINI in the last 168  hours. BNP (last 3 results) No results for input(s): PROBNP in the last 8760 hours. HbA1C: No results for input(s): HGBA1C in the last 72 hours. Sepsis Labs: @LABRCNTIP (procalcitonin:4,lacticidven:4) ) Recent Results (from the past 240 hour(s))  MRSA Next Gen by PCR, Nasal     Status: None   Collection Time: 03/29/22  4:30 AM   Specimen: Nasal Mucosa; Nasal Swab  Result Value Ref Range Status   MRSA by PCR Next Gen NOT DETECTED NOT DETECTED Final    Comment: (NOTE) The GeneXpert MRSA Assay (  FDA approved for NASAL specimens only), is one component of a comprehensive MRSA colonization surveillance program. It is not intended to diagnose MRSA infection nor to guide or monitor treatment for MRSA infections. Test performance is not FDA approved in patients less than 74 years old. Performed at [facility], [address] [place], [place] [address]       Radiology Studies: No results found.   Scheduled Meds:  apixaban  5 mg Oral BID   atorvastatin  40 mg Oral Daily   benztropine  0.5 mg Oral QHS   budesonide (PULMICORT) nebulizer solution  0.25 mg Nebulization BID   dextromethorphan-guaiFENesin  1 tablet Oral BID   dorzolamide  1 drop Left Eye BID   doxycycline  100 mg Oral Q12H   gabapentin  100 mg Oral TID   ipratropium-albuterol  3 mL Nebulization TID   latanoprost  1 drop Both Eyes QHS   loxapine  10 mg Oral QHS   methylPREDNISolone (SOLU-MEDROL) injection  40 mg Intravenous Q12H   multivitamin with minerals  1 tablet Oral Daily   nicotine  14 mg Transdermal Daily   pantoprazole  40 mg Oral Daily   sodium chloride flush  3 mL Intravenous Q12H   sodium chloride flush  3 mL Intravenous Q12H   Continuous Infusions:  sodium chloride       LOS: 1 day    Kathie Dike M.D on 04/03/2022 at 7:37 PM  Go to [email or web address] - for contact info  [facility] - Office  [phone]  If 7PM-7AM, please contact night-coverage [email or web address]  04/03/2022, 7:37 PM

## 2022-04-03 NOTE — Progress Notes (Signed)
Palliative: Detail conversation with bedside nursing staff and transition of care team.  Chart review completed.  Tony Sullivan is lying quietly in bed.  He greets me, making and somewhat keeping eye contact.  He appears chronically ill and somewhat frail, but improved from a few days ago.  He is alert and oriented, able to make his basic needs known.  There is no family at bedside at this time.  We talked about the evaluation for short-term rehab/LTC with [facility].  I shared that the evaluator will not be able to visit with him until tomorrow, he may not be admitted until Friday at the earliest.  He seems satisfied. We talk about Tony Sullivan BiPAP.  He request RT assist with educating him on how to use the BiPAP.  We talk in detail about his chronic illness burden and the treatment plan.  We talk about use of BiPAP, symptom management. We talk about CODE STATUS in detail including the concept of "treat the treatable, but allowing natural passing".  We talk about some statistics surrounding CPR and intubation.  We talk about setting limits if he so chooses.  We talk about trach/PEG.  I do not push Tony Sullivan to make a choice today.  I do encourage him to discuss his choices with his surrogate decision maker.  As we are talking, D. Georgann Housekeeper, RN from [facility] arrives.  We talk in detail about the benefits of outpatient palliative services and the benefits of hospice services, in particular for symptom management.  I will leave Ms. Georgann Sullivan to continue conversations.  Conference with attending, bedside nursing staff, transition of care team related to patient condition, needs, goals of care, disposition. Detail conference with Tony Sullivan related to patient condition, needs, goals of care.  Plan:   At this point continue to treat the treatable, full scope/full code.  Time for outcomes.  Agreeable to short-term rehab anticipating long-term care at [facility].  Agreeable to outpatient  palliative services with a [facility], transitioning to hospice care for "treat the treatable" care when appropriate.  23 minutes Tony Axe, NP Palliative medicine team  team phone [phone] Greater than 50% of this time was spent counseling and coordinating care related to the above assessment and plan.

## 2022-04-03 NOTE — TOC Progression Note (Signed)
Transition of Care [facility]) - Progression Note    Patient Details  Name: Tony Sullivan MRN: [id number] Date of Birth: 04/01/1961  Transition of [facility] CM/SW Contact  Shade Flood, LCSW Phone Number: 04/03/2022, 10:47 AM  Clinical Narrative:     TOC following. Reviewed dc plan with pt this AM. Pt remains in agreement with dc to [facility] for long term care at dc. Awaiting PASRR level II evaluation. Once complete, pt can dc. Will follow.  Expected Discharge Plan: Long Term Nursing Home Barriers to Discharge: ED Awaiting State Approval (PASRR)  Expected Discharge Plan and Services Expected Discharge Plan: [place] In-house Referral: Clinical Social Work   Post Acute Care Choice: Nursing Home Living arrangements for the past 2 months: [place] Determinants of Health (SDOH) Interventions    Readmission Risk Interventions    03/14/2022    3:54 PM 02/28/2022   10:40 AM 09/08/2019    3:22 PM  Readmission Risk Prevention Plan  Transportation Screening Complete Complete Complete  PCP or Specialist Appt within 3-5 Days   Complete  HRI or [place]   Complete  Social Work Consult for [place] Planning/Counseling   Complete  Palliative Care Screening   Not Applicable  Medication Review [employer]) Complete Complete Complete  PCP or Specialist appointment within 3-5 days of discharge Not Complete    HRI or [place] Complete Complete   SW Recovery Care/Counseling Consult Complete Complete   Palliative Care Screening Not Applicable Not [place] Patient Refused Not Applicable

## 2022-04-03 NOTE — Progress Notes (Signed)
I met with the patient at his inpatient bedside. Reviewed oncology referral as well as need for PET scan prior to appt with oncology. Reviewed appt list, including instructions for PET scan. All questions addressed and answered. My contact information was left at bedside.

## 2022-04-04 ENCOUNTER — Other Ambulatory Visit: Payer: Self-pay | Admitting: *Deleted

## 2022-04-04 DIAGNOSIS — J441 Chronic obstructive pulmonary disease with (acute) exacerbation: Secondary | ICD-10-CM | POA: Diagnosis not present

## 2022-04-04 DIAGNOSIS — I10 Essential (primary) hypertension: Secondary | ICD-10-CM | POA: Diagnosis not present

## 2022-04-04 DIAGNOSIS — F17210 Nicotine dependence, cigarettes, uncomplicated: Secondary | ICD-10-CM | POA: Diagnosis not present

## 2022-04-04 MED ORDER — PREDNISONE 20 MG PO TABS
40.0000 mg | ORAL_TABLET | Freq: Every day | ORAL | Status: DC
  Administered 2022-04-05: 40 mg via ORAL
  Filled 2022-04-04: qty 2

## 2022-04-04 MED ORDER — TRAZODONE HCL 50 MG PO TABS
50.0000 mg | ORAL_TABLET | Freq: Every day | ORAL | Status: DC
  Administered 2022-04-04: 50 mg via ORAL
  Filled 2022-04-04: qty 1

## 2022-04-04 NOTE — Progress Notes (Signed)
PROGRESS NOTE     Tony Sullivan, is a 61 y.o. male, DOB - Mar 07, 1961, DJS:[id number]  Admit date - 04/01/2022   Admitting Physician Courage Denton Brick, MD  Outpatient Primary MD for the patient is [name], FNP  LOS - 2  Chief Complaint  Patient presents with   Respiratory Distress        Brief Narrative:  61 y.o. male with past medical history relevant for chronic hypoxic and hypercapnic respiratory failure in the setting of underlying COPD and ongoing tobacco abuse, GERD, HTN, HLD who is on chronic anticoagulation who was discharged on 03/28/2022, readmitted on 04/02/2019 with COPD flareup    -Assessment and Plan: 1) acute COPD exacerbation--required BiPAP -Continue BiPAP nightly -Continue steroids, antibiotics and bronchodilators and mucolytics as ordered -Cough, dyspnea and work of breathing continues to improve   2) acute on chronic hypoxic and hypercapnic respiratory failure -Secondary to #1 above -Manage as above #1 -Patient is in the process of getting trilogy/NIV machine which she can get once he gets out of SNF -Okay to discharge to SNF/LTC if facility has a BiPAP machine   3) social/ethics--recurrent admissions to the hospital, multiple comorbid conditions palliative care consult appreciated  -patient remains full code without limitations to treatment   4) squamous cell lung cancer--- diagnosed by bronchoscopy on 03/22/2022 after presenting with hemoptysis and endobronchial mass outpatient PET scan pending -He will need to follow-up with Dr. 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 Barriers: SNF /LTC facility  Code Status :  -  Code Status: Full Code   Family Communication:    NA (patient is alert, awake and coherent)   DVT Prophylaxis  :   - SCDs  SCDs Start: 04/01/22 0852 Place TED hose Start: 04/01/22 0852 apixaban (ELIQUIS) tablet 5 mg   Lab Results  Component Value Date   PLT 183 04/02/2022    Inpatient Medications  Scheduled Meds:  apixaban  5 mg Oral BID   atorvastatin  40 mg Oral Daily   benztropine  0.5 mg Oral QHS   budesonide (PULMICORT) nebulizer solution  0.25 mg Nebulization BID   dextromethorphan-guaiFENesin  1 tablet Oral BID   dorzolamide  1 drop Left Eye BID   doxycycline  100 mg Oral Q12H   furosemide  20 mg Oral Daily   gabapentin  100 mg Oral TID   ipratropium-albuterol  3 mL Nebulization TID   latanoprost  1 drop Both Eyes QHS   loxapine  10 mg Oral QHS   multivitamin with minerals  1 tablet Oral Daily   nicotine  14 mg Transdermal Daily  pantoprazole  40 mg Oral Daily   [START ON 04/05/2022] predniSONE  40 mg Oral Q breakfast   sodium chloride flush  3 mL Intravenous Q12H   sodium chloride flush  3 mL Intravenous Q12H   traZODone  50 mg Oral QHS   Continuous Infusions:  sodium chloride     PRN Meds:.sodium chloride, acetaminophen **OR** acetaminophen, albuterol, bisacodyl, hydrOXYzine, polyethylene glycol, simethicone, sodium chloride flush   Anti-infectives (From admission, onward)    Start     Dose/Rate Route Frequency Ordered Stop   04/01/22 1100  doxycycline (VIBRA-TABS) tablet 100 mg        100 mg Oral Every 12 hours 04/01/22 0854           Subjective: He feels that his  breathing is slowly improving.  Requesting something to help him sleep overnight.   Objective: Vitals:   04/04/22 0507 04/04/22 0752 04/04/22 1400 04/04/22 1430  BP: (!) 145/96  (!) 156/95   Pulse: 81  93   Resp: 19  19   Temp: 98.1 F (36.7 C)  98.1 F (36.7 C)   TempSrc:   Oral   SpO2: 100% 99% 100% 98%  Weight:      Height:        Intake/Output Summary (Last 24 hours) at 04/04/2022 2003 Last data filed at 04/04/2022 1700 Gross per 24 hour  Intake 960 ml  Output 1200 ml  Net -240 ml   Filed Weights   04/01/22 0142 04/01/22 0833  Weight: 62.4 kg 58.9 kg    Physical Exam  Gen:- Awake Alert, chronically ill-appearing HEENT:- [place].AT, No sclera icterus Nose- 5L/min Neck-Supple Neck,No JVD,.  Lungs-diminished breath sounds with mild wheeze bilaterally CV- S1, S2 normal, regular  Abd-  +ve B.Sounds, Abd Soft, No tenderness,    Extremity/Skin:-1+ edema, pedal pulses present  Psych-affect is appropriate, oriented x3 Neuro-generalized weakness, no new focal deficits, no tremors  Data Reviewed: I have personally reviewed following labs and imaging studies  CBC: Recent Labs  Lab 03/29/22 0427 04/01/22 0208 04/02/22 0445  WBC 10.8* 12.6* 13.3*  HGB 10.9* 13.6 11.6*  HCT 34.7* 43.9 38.0*  MCV 102.1* 102.6* 103.3*  PLT 171 165 201   Basic Metabolic Panel: Recent Labs  Lab 03/29/22 0427 04/01/22 0208 04/02/22 0445  NA 143 139 140  K 3.8 4.8 3.5  CL 101 100 101  CO2 35* 32 33*  GLUCOSE 85 77 151*  BUN 20 11 19   CREATININE 0.77 0.79 0.65  CALCIUM 8.6* 8.6* 8.6*  MG 2.6*  --   --    GFR: Estimated Creatinine Clearance: 83.9 mL/min (by C-G formula based on SCr of 0.65 mg/dL). Liver Function Tests: No results for input(s): "AST", "ALT", "ALKPHOS", "BILITOT", "PROT", "ALBUMIN" in the last 168 hours. Cardiac Enzymes: No results for input(s): "CKTOTAL", "CKMB", "CKMBINDEX", "TROPONINI" in the last 168 hours. BNP (last 3 results) No results for input(s): "PROBNP"  in the last 8760 hours. HbA1C: No results for input(s): "HGBA1C" in the last 72 hours. Sepsis Labs: @LABRCNTIP (procalcitonin:4,lacticidven:4) ) Recent Results (from the past 240 hour(s))  MRSA Next Gen by PCR, Nasal     Status: None   Collection Time: 03/29/22  4:30 AM   Specimen: Nasal Mucosa; Nasal Swab  Result Value Ref Range Status   MRSA by PCR Next Gen NOT DETECTED NOT DETECTED Final    Comment: (NOTE) The GeneXpert MRSA Assay (FDA approved for NASAL specimens only), is one component of a comprehensive MRSA colonization surveillance program. It  is not intended to diagnose MRSA infection nor to guide or monitor treatment for MRSA infections. Test performance is not FDA approved in patients less than 64 years old. Performed at [facility], [address], [place], [place] [address]       Radiology Studies: No results found.   Scheduled Meds:  apixaban  5 mg Oral BID   atorvastatin  40 mg Oral Daily   benztropine  0.5 mg Oral QHS   budesonide (PULMICORT) nebulizer solution  0.25 mg Nebulization BID   dextromethorphan-guaiFENesin  1 tablet Oral BID   dorzolamide  1 drop Left Eye BID   doxycycline  100 mg Oral Q12H   furosemide  20 mg Oral Daily   gabapentin  100 mg Oral TID   ipratropium-albuterol  3 mL Nebulization TID   latanoprost  1 drop Both Eyes QHS   loxapine  10 mg Oral QHS   multivitamin with minerals  1 tablet Oral Daily   nicotine  14 mg Transdermal Daily   pantoprazole  40 mg Oral Daily   [START ON 04/05/2022] predniSONE  40 mg Oral Q breakfast   sodium chloride flush  3 mL Intravenous Q12H   sodium chloride flush  3 mL Intravenous Q12H   traZODone  50 mg Oral QHS   Continuous Infusions:  sodium chloride       LOS: 2 days    Kathie Dike M.D on 04/04/2022 at 8:03 PM  Go to [email or web address] - for contact info  [facility] - Office  [phone]  If 7PM-7AM, please contact night-coverage [email or web address]  04/04/2022, 8:03 PM

## 2022-04-04 NOTE — Progress Notes (Signed)
The proposed treatment discussed in cancer conference is for discussion purpose only and is not a binding recommendation. The patient was not physically examined nor present for their treatment options. Therefore, final treatment plans cannot be decided.  ?

## 2022-04-05 DIAGNOSIS — J441 Chronic obstructive pulmonary disease with (acute) exacerbation: Secondary | ICD-10-CM | POA: Diagnosis not present

## 2022-04-05 DIAGNOSIS — C3490 Malignant neoplasm of unspecified part of unspecified bronchus or lung: Secondary | ICD-10-CM | POA: Diagnosis not present

## 2022-04-05 DIAGNOSIS — I10 Essential (primary) hypertension: Secondary | ICD-10-CM | POA: Diagnosis not present

## 2022-04-05 LAB — BASIC METABOLIC PANEL
Anion gap: 7 (ref 5–15)
BUN: 18 mg/dL (ref 6–20)
CO2: 35 mmol/L — ABNORMAL HIGH (ref 22–32)
Calcium: 8.8 mg/dL — ABNORMAL LOW (ref 8.9–10.3)
Chloride: 101 mmol/L (ref 98–111)
Creatinine, Ser: 0.65 mg/dL (ref 0.61–1.24)
GFR, Estimated: >60 mL/min (ref >60)
Glucose, Bld: 107 mg/dL — ABNORMAL HIGH (ref 70–99)
Potassium: 3.2 mmol/L — ABNORMAL LOW (ref 3.5–5.1)
Sodium: 143 mmol/L (ref 135–145)

## 2022-04-05 MED ORDER — LIDOCAINE 5 % EX PTCH
1.0000 | MEDICATED_PATCH | CUTANEOUS | Status: DC
  Administered 2022-04-05: 1 via TRANSDERMAL
  Filled 2022-04-05: qty 1

## 2022-04-05 MED ORDER — PREDNISONE 10 MG PO TABS: ORAL_TABLET | ORAL | 0 refills | Status: DC

## 2022-04-05 MED ORDER — FUROSEMIDE 20 MG PO TABS: 20.0000 mg | ORAL_TABLET | Freq: Every day | ORAL | Status: DC

## 2022-04-05 MED ORDER — POTASSIUM CHLORIDE CRYS ER 20 MEQ PO TBCR: 20.0000 meq | EXTENDED_RELEASE_TABLET | Freq: Every day | ORAL | Status: DC

## 2022-04-05 MED ORDER — POTASSIUM CHLORIDE CRYS ER 20 MEQ PO TBCR
40.0000 meq | EXTENDED_RELEASE_TABLET | Freq: Once | ORAL | Status: AC
Start: 2022-04-05 — End: 2022-04-05
  Administered 2022-04-05: 40 meq via ORAL
  Filled 2022-04-05: qty 2

## 2022-04-05 MED ORDER — HYDROXYZINE HCL 25 MG PO TABS: 25.0000 mg | ORAL_TABLET | Freq: Three times a day (TID) | ORAL | 0 refills | Status: DC | PRN

## 2022-04-05 NOTE — Progress Notes (Signed)
**Note De-Identified  Obfuscation** RT note: Patient was upset thinking that his oxygen was turned off.  RT explained that the oxygen must be disconnected to hook up the breathing treatment.  Nursing and MD notified.

## 2022-04-05 NOTE — Discharge Summary (Signed)
Physician Discharge Summary  Tony Sullivan TMH:[id number] DOB: 1961-04-01 DOA: 04/01/2022  PCP: [name], FNP  Admit date: 04/01/2022 Discharge date: 04/05/2022  Admitted From: Home Disposition: Skilled nursing facility  Recommendations for Outpatient Follow-up:  Follow up with PCP in 1-2 weeks Please obtain BMP/CBC in one week Follow-up with oncology for further work-up of lung cancer Continue BiPAP nightly   Discharge Condition: Stable CODE STATUS: Full code Diet recommendation: Heart healthy  Brief/Interim Summary: 61 y.o. male with past medical history relevant for chronic hypoxic and hypercapnic respiratory failure in the setting of underlying COPD and ongoing tobacco abuse, GERD, HTN, HLD who is on chronic anticoagulation who was discharged on 03/28/2022, readmitted on 04/02/2019 with COPD flareup    Discharge Diagnoses:  Principal Problem:   Acute exacerbation of chronic obstructive pulmonary disease (COPD) ([place]) Active Problems:   Squamous cell lung cancer   H/o Pulmonary embolism ([place])   COPD with acute exacerbation ([place])   Essential hypertension   Cigarette smoker  1) acute COPD exacerbation--required BiPAP -Continue BiPAP nightly -He is completed a course of doxycycline -He was treated with Solu-Medrol, subsequently transition to prednisone taper -Continue on bronchodilators -Cough, dyspnea and work of breathing continues to improve   2) acute on chronic hypoxic and hypercapnic respiratory failure -Secondary to #1 above -Manage as above #1 -Patient is in the process of getting trilogy/NIV machine which he can get once he gets out of SNF -Okay to discharge to SNF/LTC if facility has a BiPAP machine   3) social/ethics--recurrent admissions to the hospital, multiple comorbid conditions palliative care consult appreciated  -patient remains full code without limitations to treatment   4) squamous cell lung cancer--- diagnosed by bronchoscopy on 03/22/2022 after  presenting with hemoptysis and endobronchial mass, outpatient PET scan pending -He will need to follow-up with Dr. Delton Coombes   5) elevated troponin--no frank ACS type symptoms -Suspect related to demand ischemia in the setting of hypoxic respiratory failure EKG with sinus tachycardia, left anterior fascicular block is not new -Patient remains asymptomatic   6) recent pulmonary embolism in the setting of underlying malignancy--- continue Eliquis   7) HTN- continue losartan  Discharge Instructions  Discharge Instructions     Diet - low sodium heart healthy   Complete by: As directed    Increase activity slowly   Complete by: As directed       Allergies as of 04/05/2022       Reactions   Penicillins Anaphylaxis   Has patient had a PCN reaction causing immediate rash, facial/tongue/throat swelling, SOB or lightheadedness with hypotension: Yes Has patient had a PCN reaction causing severe rash involving mucus membranes or skin necrosis: No Has patient had a PCN reaction that required hospitalization No Has patient had a PCN reaction occurring within the last 10 years: No If all of the above answers are "NO", then may proceed with Cephalosporin use.   Levocetirizine Other (See Comments)   Muscle cramps        Medication List     STOP taking these medications    zolpidem 5 MG tablet Commonly known as: AMBIEN       TAKE these medications    acetaminophen 325 MG tablet Commonly known as: TYLENOL Take 2 tablets (650 mg total) by mouth every 6 (six) hours as needed for mild pain (or Fever >/= 101).   albuterol (2.5 MG/3ML) 0.083% nebulizer solution Commonly known as: PROVENTIL Take 3 mLs (2.5 mg total) by nebulization every 4 (four) hours  as needed for wheezing or shortness of breath.   albuterol 108 (90 Base) MCG/ACT inhaler Commonly known as: VENTOLIN HFA Inhale 2 puffs into the lungs every 6 (six) hours as needed for wheezing or shortness of breath.   apixaban 5  MG Tabs tablet Commonly known as: ELIQUIS Take 1 tablet (5 mg total) by mouth 2 (two) times daily.   atorvastatin 40 MG tablet Commonly known as: LIPITOR TAKE ONE TABLET BY MOUTH DAILY   benztropine 0.5 MG tablet Commonly known as: COGENTIN Take 0.5 mg by mouth at bedtime.   Breztri Aerosphere 160-9-4.8 MCG/ACT Aero Generic drug: Budeson-Glycopyrrol-Formoterol Inhale 2 puffs into the lungs in the morning and at bedtime.   diclofenac Sodium 1 % Gel Commonly known as: VOLTAREN Apply 2 g topically 4 (four) times daily as needed (pain).   dorzolamide 2 % ophthalmic solution Commonly known as: TRUSOPT Place 1 drop into the left eye 2 (two) times daily.   furosemide 20 MG tablet Commonly known as: LASIX Take 1 tablet (20 mg total) by mouth daily. Start taking on: April 06, 2022   gabapentin 100 MG capsule Commonly known as: NEURONTIN Take 1 capsule (100 mg total) by mouth 3 (three) times daily.   guaiFENesin 200 MG tablet Take 2 tablets (400 mg total) by mouth every 6 (six) hours as needed for cough or to loosen phlegm.   hydrOXYzine 25 MG tablet Commonly known as: ATARAX Take 1 tablet (25 mg total) by mouth 3 (three) times daily as needed for anxiety.   latanoprost 0.005 % ophthalmic solution Commonly known as: XALATAN Place 1 drop into both eyes at bedtime.   loxapine 10 MG capsule Commonly known as: LOXITANE Take 10 mg by mouth at bedtime.   mirtazapine 30 MG tablet Commonly known as: REMERON Take 1 tablet (30 mg total) by mouth at bedtime.   multivitamin with minerals Tabs tablet Take 1 tablet by mouth daily.   OXYGEN Inhale 3-4 L into the lungs as directed. 3-4 liters with sleep and exertion as needed for low oxygen   pantoprazole 40 MG tablet Commonly known as: PROTONIX Take 1 tablet (40 mg total) by mouth daily.   polyethylene glycol 17 g packet Commonly known as: MIRALAX / GLYCOLAX TAKE 17 GRAMS BY MOUTH DAILY What changed: See the new instructions.    potassium chloride SA 20 MEQ tablet Commonly known as: KLOR-CON M Take 1 tablet (20 mEq total) by mouth daily.   predniSONE 10 MG tablet Commonly known as: DELTASONE Take 40mg  po daily for 2 days then 30mg  daily for 2 days then 20mg  daily for 2 days then 10mg  daily for 2 days then stop What changed:  how much to take how to take this when to take this additional instructions   senna-docusate 8.6-50 MG tablet Commonly known as: Senokot-S Take 2 tablets by mouth at bedtime. What changed:  when to take this reasons to take this   simethicone 80 MG chewable tablet Commonly known as: MYLICON Chew 1 tablet (80 mg total) by mouth 4 (four) times daily as needed for flatulence.        Contact information for follow-up providers     [facility] [place].   Specialty: Emergency Medicine Why: As needed, If symptoms worsen Contact information: [address] [id number] mc [place]             Contact information for after-discharge care     [facility] .   Service: Skilled Nursing Contact information: [address] [place] [address] [phone]                    Allergies  Allergen Reactions   Penicillins Anaphylaxis    Has patient had a PCN reaction causing immediate rash, facial/tongue/throat swelling, SOB or lightheadedness with hypotension: Yes Has patient had a PCN reaction causing severe rash involving mucus membranes or skin necrosis: No Has patient had a PCN reaction that required hospitalization No Has patient had a PCN reaction occurring within the last 10 years: No If all of the above answers are "NO", then may proceed with Cephalosporin use.    Levocetirizine Other (See Comments)    Muscle cramps    Consultations:    Procedures/Studies: DG Chest Portable 1 View  Result Date: 04/01/2022 CLINICAL DATA:  Chest pain and  shortness of breath. EXAM: PORTABLE CHEST 1 VIEW COMPARISON:  March 28, 2022 FINDINGS: The heart size and mediastinal contours are within normal limits. Lungs are hyperinflated with flattening of the diaphragms and evidence of moderate severity emphysematous lung disease. There is no evidence of focal consolidation, pleural effusion or pneumothorax. A radiopaque fusion plate and screws are seen overlying the cervical spine. Chronic left-sided rib fractures are noted. IMPRESSION: COPD without evidence of acute or active cardiopulmonary disease. Electronically Signed   By: Virgina Norfolk M.D.   On: 04/01/2022 02:24   DG Chest Portable 1 View  Result Date: 03/28/2022 CLINICAL DATA:  SOB EXAM: PORTABLE CHEST 1 VIEW COMPARISON:  Most recent prior chest x-ray 03/21/2022; CT scan of the chest 03/18/2022 FINDINGS: Cardiac and mediastinal contours are within normal limits. Mild atherosclerotic calcifications in the transverse aorta. As before, advanced emphysematous and chronic bronchitic changes present bilaterally. No focal airspace infiltrate. Remote healed fracture of the posterior aspect of the right sixth rib gives the pseudo appearance of a pulmonary nodule. No pleural effusion or pneumothorax. IMPRESSION: Stable chest x-ray with hyperinflation and advanced emphysematous and bronchitic changes suggesting COPD. No acute cardiopulmonary process. Electronically Signed   By: Jacqulynn Cadet M.D.   On: 03/28/2022 10:29   DG CHEST PORT 1 VIEW  Result Date: 03/23/2022 CLINICAL DATA:  Shortness of breath starting this morning. EXAM: PORTABLE CHEST 1 VIEW COMPARISON:  03/21/2022 and chest CT dated 03/18/2022. FINDINGS: The previously demonstrated healing callus associated with a fracture of the right posterior 6th rib is unchanged. The previously demonstrated 2nd nodular density more superiorly is no longer seen. Currently, the lungs are clear and remain hyperexpanded. Normal sized heart. Mildly tortuous and  calcified thoracic aorta. IMPRESSION: 1. Resolved nodular density in the right upper lung zone. 2. Stable changes of COPD. Electronically Signed   By: Claudie Revering M.D.   On: 03/23/2022 10:00   DG CHEST PORT 1 VIEW  Result Date: 03/21/2022 CLINICAL DATA:  Short of breath and weakness EXAM: PORTABLE CHEST 1 VIEW COMPARISON:  03/19/2022 FINDINGS: COPD and emphysema. Negative for heart failure or edema. No infiltrate or effusion Nodular density in the right upper lobe corresponds to healing rib fracture as noted on CT. Second nodule superior to the rib callus is also noted and not seen on the prior chest x-ray. No nodule seen in the lungs on the prior CT of 03/18/2022. IMPRESSION: COPD and emphysema.  No acute infiltrate or effusion. Electronically Signed   By: Franchot Gallo M.D.   On: 03/21/2022 11:37   DG Chest Port 1 View  Result Date:  03/19/2022 CLINICAL DATA:  Hemoptysis EXAM: PORTABLE CHEST 1 VIEW COMPARISON:  03/18/2022, CT 03/18/2022 FINDINGS: Emphysema and bronchitic changes. No consolidation, pleural effusion, or pneumothorax. Prominent callus at the right sixth rib fracture accounts for right lower mid lung nodular opacity. Stable cardiomediastinal silhouette. IMPRESSION: Emphysema and bronchitic changes without acute airspace disease Electronically Signed   By: Donavan Foil M.D.   On: 03/19/2022 20:05   CT Chest W Contrast  Result Date: 03/19/2022 CLINICAL DATA:  Respiratory illness, nondiagnostic xray. Chest pain. EXAM: CT CHEST WITH CONTRAST TECHNIQUE: Multidetector CT imaging of the chest was performed during intravenous contrast administration. RADIATION DOSE REDUCTION: This exam was performed according to the departmental dose-optimization program which includes automated exposure control, adjustment of the mA and/or kV according to patient size and/or use of iterative reconstruction technique. CONTRAST:  36mL OMNIPAQUE IOHEXOL 300 MG/ML  SOLN COMPARISON:  03/13/2022 FINDINGS:  Cardiovascular: Moderate multi-vessel coronary artery calcification. Global cardiac size within normal limits. No pericardial effusion. Central pulmonary arteries are enlarged in keeping with changes of pulmonary arterial hypertension. Mild atherosclerotic calcification within the thoracic aorta. No aortic aneurysm. Mediastinum/Nodes: No enlarged mediastinal, hilar, or axillary lymph nodes. Thyroid gland, trachea, and esophagus demonstrate no significant findings. Lungs/Pleura: Severe emphysema. Superimposed airway inflammation again noted with bronchial wall thickening. Airway impaction within the lower lobes has improved in the interval since prior examination. Endobronchial mass is again identified within the left upper lobe are pulmonary bronchus extending peripherally into multiple segmental and subsegmental bronchi of the left upper lobe. No pneumothorax or pleural effusion. Upper Abdomen: No acute abnormality. Musculoskeletal: Stable mild compression deformities of T5, T8, T9-T10, T11, and L1. No acute bone abnormality. IMPRESSION: Severe emphysema. Superimposed airway inflammation. Improved airway impaction within the lower lobes bilaterally. 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 Correlation with endoscopy is recommended. Moderate multi-vessel coronary artery calcification. Morphologic changes in keeping with pulmonary arterial hypertension. Numerous thoracic compression deformities, stable since prior examination. Aortic Atherosclerosis (ICD10-I70.0) and Emphysema (ICD10-J43.9). Electronically Signed   By: Fidela Salisbury M.D.   On: 03/19/2022 00:18   DG Chest 1 View  Result Date: 03/18/2022 CLINICAL DATA:  Chest and abdominal pain. EXAM: CHEST  1 VIEW COMPARISON:  CT chest dated Mar 13, 2022 FINDINGS: The heart size and mediastinal contours are within normal limits. Emphysematous changes of the lungs. Focal  consolidation or appreciable pleural effusion. Callus formation of the prior right posterior sixth rib fracture. IMPRESSION: 1. Emphysematous changes without evidence of acute cardiopulmonary process. 2.  Callus formation of the prior right sixth rib fracture. Electronically Signed   By: Keane Police D.O.   On: 03/18/2022 17:05   US Venous Img Lower Bilateral (DVT)  Result Date: 03/14/2022 CLINICAL DATA:  Bilateral lower extremity pain for the past 5 years EXAM: BILATERAL LOWER EXTREMITY VENOUS DOPPLER ULTRASOUND TECHNIQUE: Gray-scale sonography with graded compression, as well as color Doppler and duplex ultrasound were performed to evaluate the lower extremity deep venous systems from the level of the common femoral vein and including the common femoral, femoral, profunda femoral, popliteal and calf veins including the posterior tibial, peroneal and gastrocnemius veins when visible. The superficial great saphenous vein was also interrogated. Spectral Doppler was utilized to evaluate flow at rest and with distal augmentation maneuvers in the common femoral, femoral and popliteal veins. COMPARISON:  None Available. FINDINGS: RIGHT LOWER EXTREMITY Common Femoral Vein: No evidence of thrombus. Normal compressibility, respiratory phasicity and response to  augmentation. Saphenofemoral Junction: No evidence of thrombus. Normal compressibility and flow on color Doppler imaging. Profunda Femoral Vein: No evidence of thrombus. Normal compressibility and flow on color Doppler imaging. Femoral Vein: No evidence of thrombus. Normal compressibility, respiratory phasicity and response to augmentation. Popliteal Vein: No evidence of thrombus. Normal compressibility, respiratory phasicity and response to augmentation. Calf Veins: No evidence of thrombus. Normal compressibility and flow on color Doppler imaging. Superficial Great Saphenous Vein: No evidence of thrombus. Normal compressibility. Venous Reflux:  None. Other  Findings:  None. LEFT LOWER EXTREMITY Common Femoral Vein: No evidence of thrombus. Normal compressibility, respiratory phasicity and response to augmentation. Saphenofemoral Junction: No evidence of thrombus. Normal compressibility and flow on color Doppler imaging. Profunda Femoral Vein: No evidence of thrombus. Normal compressibility and flow on color Doppler imaging. Femoral Vein: No evidence of thrombus. Normal compressibility, respiratory phasicity and response to augmentation. Popliteal Vein: No evidence of thrombus. Normal compressibility, respiratory phasicity and response to augmentation. Calf Veins: No evidence of thrombus. Normal compressibility and flow on color Doppler imaging. Superficial Great Saphenous Vein: No evidence of thrombus. Normal compressibility. Venous Reflux:  None. Other Findings:  None. IMPRESSION: No evidence of deep venous thrombosis in either lower extremity. Electronically Signed   By: Jacqulynn Cadet M.D.   On: 03/14/2022 14:18   CT Angio Chest Pulmonary Embolism (PE) W or WO Contrast  Addendum Date: 03/13/2022   ADDENDUM REPORT: 03/13/2022 20:03 ADDENDUM: Upon further review, there is a small nonocclusive hypodense acute thrombus in the posterior right upper lobe segmental main artery on 4:41, partially occlusive thrombus in an adjacent apically directed first order segmental artery best seen on series 8 coronal reconstruction image 63 and 4: 38, with nonocclusive extension of thrombus a short distance into 2 of its subsegmental branch arteries on 4:37. This is a small embolic burden with no findings of associated acute right heart strain. Electronically Signed   By: Telford Nab M.D.   On: 03/13/2022 20:03   Result Date: 03/13/2022 CLINICAL DATA:  Chest tightness and shortness of breath. EXAM: CT ANGIOGRAPHY CHEST WITH CONTRAST TECHNIQUE: Multidetector CT imaging of the chest was performed using the standard protocol during bolus administration of intravenous contrast.  Multiplanar CT image reconstructions and MIPs were obtained to evaluate the vascular anatomy. RADIATION DOSE REDUCTION: This exam was performed according to the departmental dose-optimization program which includes automated exposure control, adjustment of the mA and/or kV according to patient size and/or use of iterative reconstruction technique. CONTRAST:  168mL OMNIPAQUE IOHEXOL 350 MG/ML SOLN COMPARISON:  Portable chest 03/11/2022 portable chest today, CTA chest 02/14/2022, CTA chest 05/12/2018. FINDINGS: Cardiovascular: The cardiac size is normal. There is scattered calcification in the coronary arteries. There is no pericardial effusion. The great vessels are patent with normal variant brachiobicarotid trunk. The ascending aorta is borderline aneurysmal measuring 3.9 cm. The remainder is within normal caliber limits. The pulmonary veins are decompressed. Prominent but unchanged pulmonary trunk again measuring 3.3 cm indicating arterial hypertension. The pulmonary arteries are free of thrombus at least through the segmental arteries with the subsegmental arterial bed for the most part obscured by breathing motion. Mediastinum/Nodes: No enlarged mediastinal, hilar, or axillary lymph nodes. Thyroid gland, trachea, and esophagus demonstrate no significant findings. Lungs/Pleura: Advanced centrilobular/panlobular emphysema predominating in the upper lobes. Are scattered linear scar-like opacities. There is again noted airway obstruction/impaction involving the left upper lobe apicoposterior segmental main bronchus with mucous plugging or debris continuing cephalad into several subsegmental bronchi, as before. There is increased right  lower lobe posterior basal subsegmental bronchial impaction on series 6 axial 97-107. Increased posterior basal segmental and subsegmental left lower lobe bronchial impactions are also noted with increased layering fluid in the left main bronchus. There is increased subpleural linear  atelectasis in the posterior basal lower lobes compared to the last CT but no appreciable infiltrates. There is no pleural effusion, thickening or pneumothorax. Upper Abdomen: No acute findings. Musculoskeletal: Osteopenia. 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 There is a new moderate compression fracture of the T5 vertebral body with trace posterosuperior cortical retropulsion, 50% anterior and 40% posterior vertebral height loss, and a mild upper plate anterior wedge compression fracture of the T8 vertebral body without retropulsion, with 30% anterior and 25% posterior vertebral height loss. Again noted are there mild chronic compression fractures of T10, T11 and L1. Review of the MIP images confirms the above findings. IMPRESSION: 1. Negative for visible pulmonary embolism with the subsegmental arterial bed largely not evaluated to breathing motion. 2. Segmental/subsegmental airway opacification left upper lobe which was seen previously. Obstructing soft tissue mass is not excluded. Bronchoscopy recommended. 3. Increased fluid/debris in the lower lobe posterior basal segmental/subsegmental bronchi. Consider aspiration precautions. There is increased fluid in the left main bronchus. 4. Increased atelectasis in the posterior lung bases but no visible infiltrates. Severe COPD change. 5. Compression fractures of the T5 and 8 vertebral bodies new from 02/14/2022 but acuity indeterminate. Osteopenia and chronic compression fractures. 6. Borderline aneurysmal ascending aorta. No aortic dissection or other significant ectasia. 7. Aortic and coronary artery atherosclerosis. Electronically Signed: By: Telford Nab M.D. On: 03/13/2022 04:57   DG Chest Port 1 View  Result Date: 03/13/2022 CLINICAL DATA:  Chest pain, shortness of breath EXAM: PORTABLE CHEST 1 VIEW COMPARISON:  01/09/2022 FINDINGS: Mild patchy/nodular right upper lobe opacities, raising the possibility of mild  pneumonia. Left lung is essentially clear. No pleural effusion or pneumothorax. The heart is normal in size. IMPRESSION: Mild patchy/nodular right upper lobe opacities, raising the possibility of mild pneumonia. Electronically Signed   By: Julian Hy M.D.   On: 03/13/2022 03:19   DG Chest Portable 1 View  Result Date: 03/11/2022 CLINICAL DATA:  Shortness of breath EXAM: PORTABLE CHEST 1 VIEW COMPARISON:  Five days ago FINDINGS: Hyperinflation and architectural distortion from emphysema. Nodular density over the right chest is from rib callus by April 2023 CT. There is no edema, consolidation, effusion, or pneumothorax. Normal heart size and mediastinal contours. IMPRESSION: COPD without acute superimposed finding. Electronically Signed   By: Jorje Guild M.D.   On: 03/11/2022 05:22   DG Chest 2 View  Result Date: 03/06/2022 CLINICAL DATA:  Chest pain. EXAM: CHEST - 2 VIEW COMPARISON:  Chest x-ray May 3, 23. FINDINGS: No consolidation. No visible pleural effusions or pneumothorax. Hyperinflation. Remote right rib fracture bony callus. IMPRESSION: Hyperinflation without acute abnormality. Electronically Signed   By: Margaretha Sheffield M.D.   On: 03/06/2022 14:09      Subjective: He says he is having a cough with sputum but is difficult to expectorate.  Breathing is otherwise at baseline.  Discharge Exam: Vitals:   04/04/22 2015 04/04/22 2354 04/05/22 0427 04/05/22 0746  BP:   (!) 144/97   Pulse:  (!) 104 90   Resp:  18 18   Temp:   97.7 F (36.5 C)   TempSrc:   Oral   SpO2: 100% 97% 100% 98%  Weight:      Height:  General: Pt is alert, awake, not in acute distress Cardiovascular: RRR, S1/S2 +, no rubs, no gallops Respiratory: scattered rhonchi Abdominal: Soft, NT, ND, bowel sounds + Extremities: 1+ edema, no cyanosis    The results of significant diagnostics from this hospitalization (including imaging, microbiology, ancillary and laboratory) are listed below for  reference.     Microbiology: Recent Results (from the past 240 hour(s))  MRSA Next Gen by PCR, Nasal     Status: None   Collection Time: 03/29/22  4:30 AM   Specimen: Nasal Mucosa; Nasal Swab  Result Value Ref Range Status   MRSA by PCR Next Gen NOT DETECTED NOT DETECTED Final    Comment: (NOTE) The GeneXpert MRSA Assay (FDA approved for NASAL specimens only), is one component of a comprehensive MRSA colonization surveillance program. It is not intended to diagnose MRSA infection nor to guide or monitor treatment for MRSA infections. Test performance is not FDA approved in patients less than 49 years old. Performed at [facility], [address], [place],  [address]      Labs: BNP (last 3 results) Recent Labs    03/23/22 0846 03/28/22 1016 04/01/22 0208  BNP 47.0 61.0 287.8*   Basic Metabolic Panel: Recent Labs  Lab 04/01/22 0208 04/02/22 0445 04/05/22 0447  NA 139 140 143  K 4.8 3.5 3.2*  CL 100 101 101  CO2 32 33* 35*  GLUCOSE 77 151* 107*  BUN 11 19 18   CREATININE 0.79 0.65 0.65  CALCIUM 8.6* 8.6* 8.8*   Liver Function Tests: No results for input(s): "AST", "ALT", "ALKPHOS", "BILITOT", "PROT", "ALBUMIN" in the last 168 hours. No results for input(s): "LIPASE", "AMYLASE" in the last 168 hours. No results for input(s): "AMMONIA" in the last 168 hours. CBC: Recent Labs  Lab 04/01/22 0208 04/02/22 0445  WBC 12.6* 13.3*  HGB 13.6 11.6*  HCT 43.9 38.0*  MCV 102.6* 103.3*  PLT 165 183   Cardiac Enzymes: No results for input(s): "CKTOTAL", "CKMB", "CKMBINDEX", "TROPONINI" in the last 168 hours. BNP: Invalid input(s): "POCBNP" CBG: No results for input(s): "GLUCAP" in the last 168 hours. D-Dimer No results for input(s): "DDIMER" in the last 72 hours. Hgb A1c No results for input(s): "HGBA1C" in the last 72 hours. Lipid Profile No results for input(s): "CHOL", "HDL", "LDLCALC", "TRIG", "CHOLHDL", "LDLDIRECT" in the last 72 hours. Thyroid function  studies No results for input(s): "TSH", "T4TOTAL", "T3FREE", "THYROIDAB" in the last 72 hours.  Invalid input(s): "FREET3" Anemia work up No results for input(s): "VITAMINB12", "FOLATE", "FERRITIN", "TIBC", "IRON", "RETICCTPCT" in the last 72 hours. Urinalysis    Component Value Date/Time   COLORURINE YELLOW 10/16/2021 0130   APPEARANCEUR CLEAR 10/16/2021 0130   LABSPEC 1.020 10/16/2021 0130   PHURINE 6.0 10/16/2021 0130   GLUCOSEU NEGATIVE 10/16/2021 0130   HGBUR TRACE (A) 10/16/2021 0130   BILIRUBINUR NEGATIVE 10/16/2021 0130   KETONESUR NEGATIVE 10/16/2021 0130   PROTEINUR NEGATIVE 10/16/2021 0130   UROBILINOGEN 1.0 06/12/2015 1021   NITRITE NEGATIVE 10/16/2021 0130   LEUKOCYTESUR NEGATIVE 10/16/2021 0130   Sepsis Labs Recent Labs  Lab 04/01/22 0208 04/02/22 0445  WBC 12.6* 13.3*   Microbiology Recent Results (from the past 240 hour(s))  MRSA Next Gen by PCR, Nasal     Status: None   Collection Time: 03/29/22  4:30 AM   Specimen: Nasal Mucosa; Nasal Swab  Result Value Ref Range Status   MRSA by PCR Next Gen NOT DETECTED NOT DETECTED Final    Comment: (NOTE) The GeneXpert  MRSA Assay (FDA approved for NASAL specimens only), is one component of a comprehensive MRSA colonization surveillance program. It is not intended to diagnose MRSA infection nor to guide or monitor treatment for MRSA infections. Test performance is not FDA approved in patients less than 16 years old. Performed at [facility], [address], [place], [place] [address]      Time coordinating discharge: 20mins  SIGNED:   Kathie Dike, MD  [facility] 04/05/2022, 1:14 PM   If 7PM-7AM, please contact night-coverage [email or web address]

## 2022-04-05 NOTE — Progress Notes (Signed)
Called report earlier in day to [facility] when I thought he was going to be on trilogy and they rejected him, due to my misunderstanding .  He was only going to be on bipap.  Through communicating with Dr. Roderic Palau and social work it was straightened out and patient was transported by EMS.  Gave final report to [facility] .

## 2022-04-05 NOTE — TOC Progression Note (Signed)
Transition of Care [facility]) - Progression Note    Patient Details  Name: Tony Sullivan MRN: [id number] Date of Birth: [birth year]  Transition of Care [facility]) CM/SW [place], [place] Phone Number: 04/05/2022, 10:53 AM  Clinical Narrative:    Awaiting review of pt for PASRR level II. Will updated when it has been completed. TOC to follow.   Expected Discharge Plan: Long Term Nursing Home Barriers to Discharge: ED Awaiting State Approval (PASRR)  Expected Discharge Plan and Services Expected Discharge Plan: [place] In-house Referral: Clinical Social Work   Post Acute Care Choice: Nursing Home Living arrangements for the past 2 months: [place] Determinants of Health (SDOH) Interventions    Readmission Risk Interventions    03/14/2022    3:54 PM 02/28/2022   10:40 AM 09/08/2019    3:22 PM  Readmission Risk Prevention Plan  Transportation Screening Complete Complete Complete  PCP or Specialist Appt within 3-5 Days   Complete  HRI or [place]   Complete  Social Work Consult for [place] Planning/Counseling   Complete  Palliative Care Screening   Not Applicable  Medication Review Press photographer) Complete Complete Complete  PCP or Specialist appointment within 3-5 days of discharge Not Complete    HRI or [place] Complete Complete   SW Recovery Care/Counseling Consult Complete Complete   Palliative Care Screening Not Applicable Not [place] Patient Refused Not Applicable

## 2022-04-05 NOTE — TOC Transition Note (Signed)
Transition of Care [facility]) - CM/SW Discharge Note   Patient Details  Name: Tony Sullivan MRN: [id number] Date of Birth: [birth year]  Transition of Care [facility]) CM/SW Contact:  Iona Beard, [place] Phone Number: 04/05/2022, 2:57 PM   Clinical Narrative:    [name] has been completed. [facility] is ready to accept pt. They have pts Bipap at the facility. CSW provided pts daughter with update on DC. CSW provided RN with number for report. EMS has been called for transport. TOC signing off.   Final next level of care: Long Term Nursing Home Barriers to Discharge: Barriers Resolved   Patient Goals and CMS Choice Patient states their goals for this hospitalization and ongoing recovery are:: go to LTC CMS Medicare.gov Compare Post Acute Care list provided to:: Patient Choice offered to / list presented to : Patient  Discharge Placement PASRR number recieved: 04/05/22            Patient chooses bed at: [facility] Patient to be transferred to facility by: EMS Name of family member notified: [name] Patient and family notified of of transfer: 04/05/22  Discharge Plan and Services In-house Referral: Clinical Social Work   Post Acute Care Choice: Nursing Home                               Social Determinants of Health ([place]) Interventions     Readmission Risk Interventions    03/14/2022    3:54 PM 02/28/2022   10:40 AM 09/08/2019    3:22 PM  Readmission Risk Prevention Plan  Transportation Screening Complete Complete Complete  PCP or Specialist Appt within 3-5 Days   Complete  HRI or [place]   Complete  Social Work Consult for [place] Planning/Counseling   Complete  Palliative Care Screening   Not Applicable  Medication Review Press photographer) Complete Complete Complete  PCP or Specialist appointment within 3-5 days of discharge Not Complete    HRI or [place] Complete Complete   SW Recovery Care/Counseling Consult Complete  Complete   Palliative Care Screening Not Applicable Not [place] Patient Refused Not Applicable

## 2022-04-06 ENCOUNTER — Emergency Department (HOSPITAL_COMMUNITY): Payer: Medicaid Other

## 2022-04-06 ENCOUNTER — Emergency Department (HOSPITAL_COMMUNITY)
Admission: EM | Admit: 2022-04-06 | Discharge: 2022-04-06 | Disposition: A | Discharge status: Home / Self Care | Payer: Medicaid Other | Attending: Emergency Medicine | Admitting: Emergency Medicine

## 2022-04-06 ENCOUNTER — Encounter (HOSPITAL_COMMUNITY): Payer: Self-pay | Admitting: Emergency Medicine

## 2022-04-06 DIAGNOSIS — D72829 Elevated white blood cell count, unspecified: Secondary | ICD-10-CM | POA: Diagnosis not present

## 2022-04-06 DIAGNOSIS — R0602 Shortness of breath: Secondary | ICD-10-CM | POA: Diagnosis present

## 2022-04-06 LAB — COMPREHENSIVE METABOLIC PANEL
ALT: 36 U/L (ref 0–44)
AST: 32 U/L (ref 15–41)
Albumin: 2.8 g/dL — ABNORMAL LOW (ref 3.5–5.0)
Alkaline Phosphatase: 147 U/L — ABNORMAL HIGH (ref 38–126)
Anion gap: 10 (ref 5–15)
BUN: 15 mg/dL (ref 6–20)
CO2: 34 mmol/L — ABNORMAL HIGH (ref 22–32)
Calcium: 9 mg/dL (ref 8.9–10.3)
Chloride: 99 mmol/L (ref 98–111)
Creatinine, Ser: 0.73 mg/dL (ref 0.61–1.24)
GFR, Estimated: >60 mL/min (ref >60)
Glucose, Bld: 91 mg/dL (ref 70–99)
Potassium: 4 mmol/L (ref 3.5–5.1)
Sodium: 143 mmol/L (ref 135–145)
Total Bilirubin: 0.5 mg/dL (ref 0.3–1.2)
Total Protein: 5.7 g/dL — ABNORMAL LOW (ref 6.5–8.1)

## 2022-04-06 LAB — CBC WITH DIFFERENTIAL/PLATELET
Abs Immature Granulocytes: 1 10*3/uL — ABNORMAL HIGH (ref 0.00–0.07)
Basophils Absolute: 0 10*3/uL (ref 0.0–0.1)
Basophils Relative: 0 %
Eosinophils Absolute: 0.1 10*3/uL (ref 0.0–0.5)
Eosinophils Relative: 1 %
HCT: 42.8 % (ref 39.0–52.0)
Hemoglobin: 13 g/dL (ref 13.0–17.0)
Lymphocytes Relative: 10 %
Lymphs Abs: 1.5 10*3/uL (ref 0.7–4.0)
MCH: 31.4 pg (ref 26.0–34.0)
MCHC: 30.4 g/dL (ref 30.0–36.0)
MCV: 103.4 fL — ABNORMAL HIGH (ref 80.0–100.0)
Monocytes Absolute: 0.4 10*3/uL (ref 0.1–1.0)
Monocytes Relative: 3 %
Myelocytes: 5 %
Neutro Abs: 11.8 10*3/uL — ABNORMAL HIGH (ref 1.7–7.7)
Neutrophils Relative %: 79 %
Platelets: 184 10*3/uL (ref 150–400)
Promyelocytes Relative: 2 %
RBC: 4.14 MIL/uL — ABNORMAL LOW (ref 4.22–5.81)
RDW: 15 % (ref 11.5–15.5)
WBC: 14.9 10*3/uL — ABNORMAL HIGH (ref 4.0–10.5)
nRBC: 0.7 % — ABNORMAL HIGH (ref 0.0–0.2)
nRBC: 2 /100 WBC — ABNORMAL HIGH

## 2022-04-06 LAB — TROPONIN I (HIGH SENSITIVITY)
Troponin I (High Sensitivity): 24 ng/L — ABNORMAL HIGH (ref <18)
Troponin I (High Sensitivity): 22 ng/L — ABNORMAL HIGH (ref <18)

## 2022-04-06 LAB — BRAIN NATRIURETIC PEPTIDE: B Natriuretic Peptide: 61.9 pg/mL (ref 0.0–100.0)

## 2022-04-06 IMAGING — DX DG CHEST 1V PORT
1 series · 2 of 2 positions shown · non-contrast
Comparison: [DATE]

CLINICAL DATA: Evaluate for shortness of breath.

EXAM:
PORTABLE CHEST 1 VIEW

[Series 1: chest · 0.14mm/px · 2 of 2 slices shown]
[im 1/2]
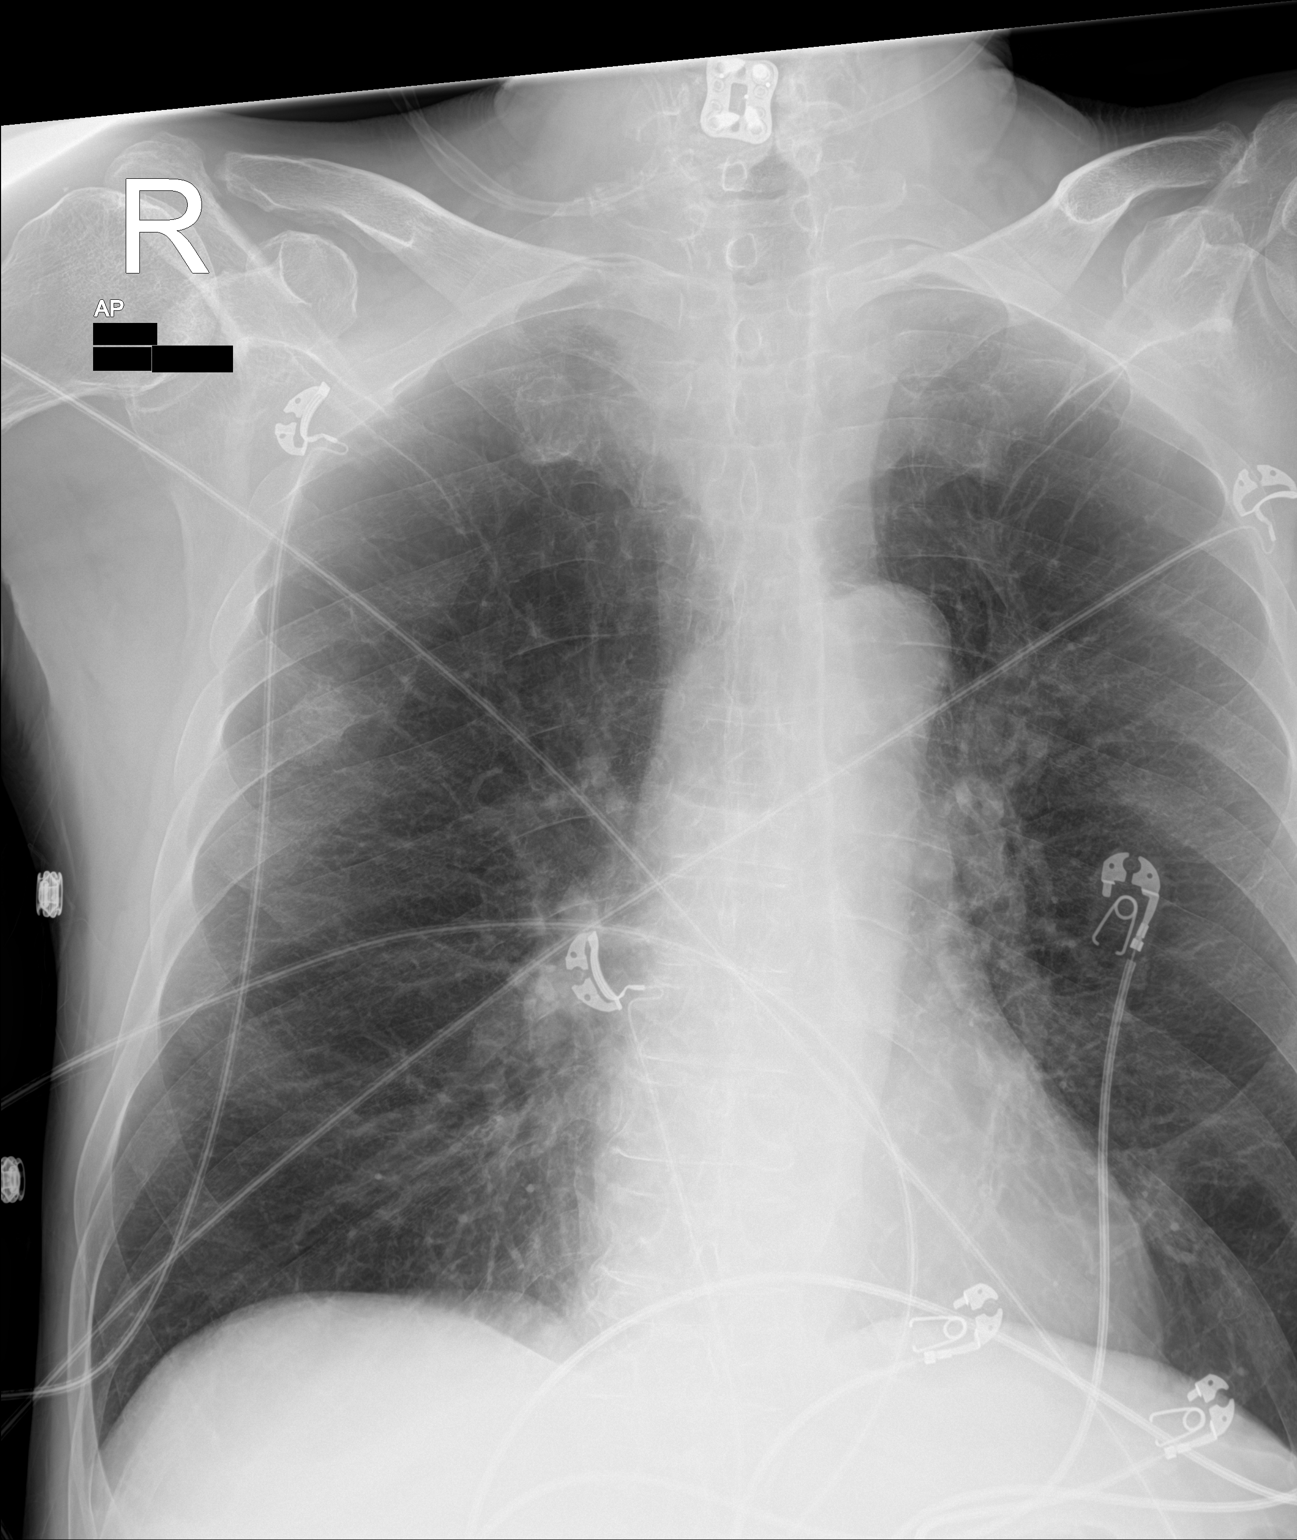
[im 2/2]
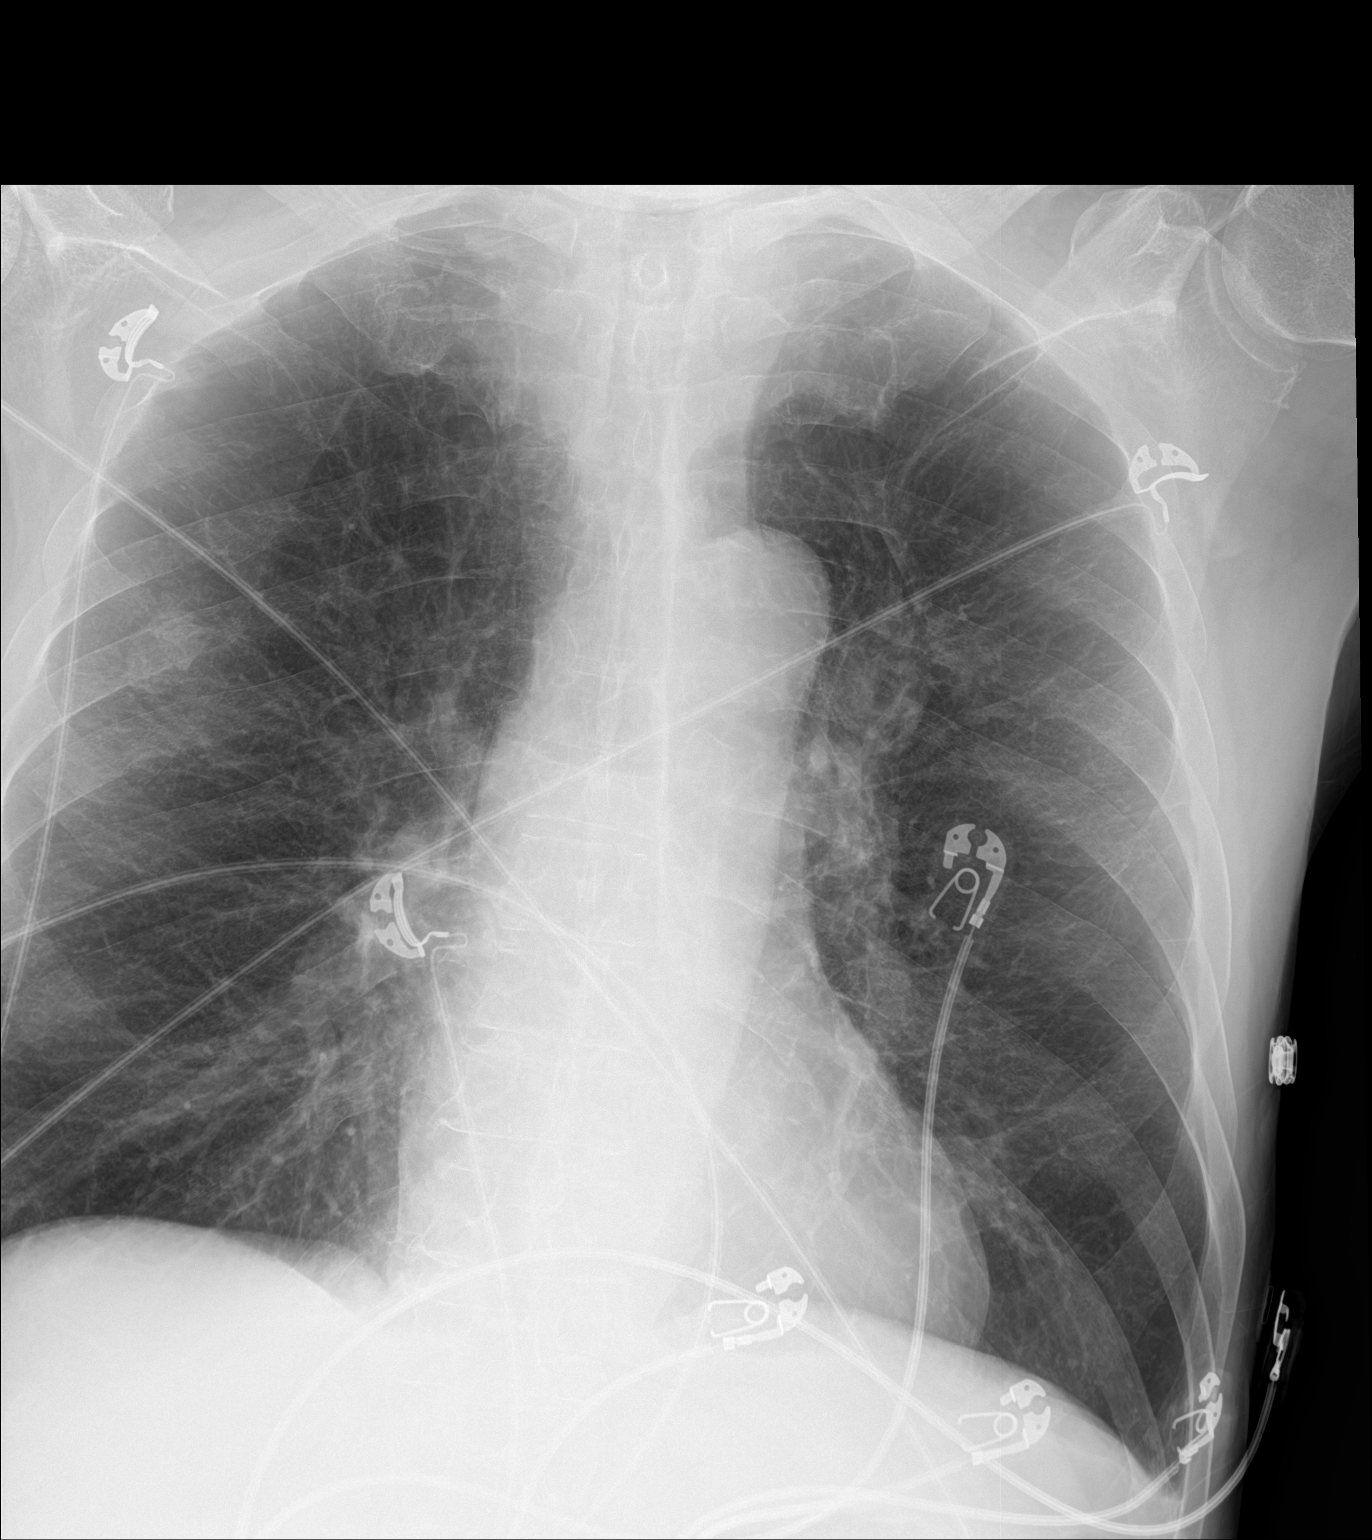

[2 of 2 positions shown; findings below may reference images not displayed]

FINDINGS: Stable cardiomediastinal contours. The lungs are hyperinflated with
interstitial changes of emphysema. No pleural effusion or edema
identified. No airspace opacities. Unchanged right posterior fifth
rib fracture.
IMPRESSION: 1.  Emphysema ([XJ]-[XJ]).
2. No acute cardiopulmonary abnormalities.

## 2022-04-06 MED ORDER — IPRATROPIUM-ALBUTEROL 0.5-2.5 (3) MG/3ML IN SOLN
3.0000 mL | RESPIRATORY_TRACT | Status: DC | PRN
  Filled 2022-04-06: qty 3

## 2022-04-06 MED ORDER — METHYLPREDNISOLONE SODIUM SUCC 125 MG IJ SOLR
125.0000 mg | Freq: Once | INTRAMUSCULAR | Status: AC
Start: 2022-04-06 — End: 2022-04-06
  Administered 2022-04-06: 125 mg via INTRAVENOUS
  Filled 2022-04-06: qty 2

## 2022-04-06 MED ORDER — ALBUTEROL SULFATE HFA 108 (90 BASE) MCG/ACT IN AERS
1.0000 | INHALATION_SPRAY | Freq: Once | RESPIRATORY_TRACT | Status: AC
  Administered 2022-04-06: 1 via RESPIRATORY_TRACT
  Filled 2022-04-06: qty 6.7

## 2022-04-06 MED ORDER — GABAPENTIN 100 MG PO CAPS
100.0000 mg | ORAL_CAPSULE | Freq: Once | ORAL | Status: AC
  Administered 2022-04-06: 100 mg via ORAL
  Filled 2022-04-06: qty 1

## 2022-04-06 NOTE — ED Notes (Signed)
Pt's disposable brief changed

## 2022-04-06 NOTE — ED Notes (Signed)
Pt placed into disposable brief by ED staff for pt's c/o need for bowel movement. Pt stated that he is unable to stand for bedside commode.

## 2022-04-06 NOTE — ED Notes (Signed)
Jeneen Rinks (step father) [phone] Mardene Celeste (fiance) [phone]

## 2022-04-06 NOTE — ED Triage Notes (Signed)
Pt bib EMS from [facility] for SOB ongoing x24 hours. Albuterol treatment given at pt's facility, no improvement. Pt wears 4-5L oxygen via nasal cannula chronically, arrives on 5L. C/o pressure sensation on chest that is "normal" for pt when pt experiences SOB.   95% 5L 138/100 Pulse 72 RR 20

## 2022-04-06 NOTE — ED Provider Notes (Signed)
Sign out note  61 year old gentleman presenting to ER from nursing facility due to reported shortness of breath.  Recent admission for COPD.  Discharged to SNF.  Work-up completed thus far notable for some leukocytosis the patient has had leukocytosis.  CXR however was negative for new infiltrate.  EKG without acute ischemic change and initial troponin is 25, similar to prior on last admission.  Patient does not have ongoing complaints and is on his home level of O2.  Dr. Dayna Barker feels he is stable for discharge so long as his repeat troponin is stable and patient remains well-appearing.  Repeat troponin is 22.  I reassessed patient and he informed me that he does not have any ongoing medical complaints.  His vital signs are within normal limits, feel he was appropriate for discharge.  He informs me however that he does not want to go back to his nursing facility.  I have consulted TOC who came to bedside and discussed options with patient.  Patient up until recently was living at home, states that he has his home oxygen concentrator at home, and has all necessary resources to do well at home.  I recommended that he should go back to his SNF this afternoon and then consider placement at a different facility.  Patient however insistent that he goes home at this time.  Staff has contacted patient's family to corroborate that patient has adequate resources at home.  Case manager is working on arrangements for home health resources.  Rechecked outpatient and he affirms his prior decision to go home.  We will go ahead and discharge patient home at this time.  Stressed need for strict return precautions and importance of following up with his primary doctors.  After the discussed management above, the patient was determined to be safe for discharge.  The patient was in agreement with this plan and all questions regarding their care were answered.  ED return precautions were discussed and the patient will return to  the ED with any significant worsening of condition.    Lucrezia Starch, MD 04/07/22 [phone]

## 2022-04-06 NOTE — TOC Progression Note (Signed)
Transition of Care [facility]) - Progression Note    Patient Details  Name: Tony Sullivan MRN: [id number] Date of Birth: 03-Oct-1961  Transition of Care [facility]) CM/SW [place], [place] Phone Number: 04/06/2022, 8:10 AM  Clinical Narrative:    CSW received consult regarding patient wishing to go to a different SNF. CSW discussed with team patient would need to talk with the facility staff to initiate that process. Patient will need to return to facility when medically cleared.         Expected Discharge Plan and Services                                                 Social Determinants of Health (SDOH) Interventions    Readmission Risk Interventions    03/14/2022    3:54 PM 02/28/2022   10:40 AM 09/08/2019    3:22 PM  Readmission Risk Prevention Plan  Transportation Screening Complete Complete Complete  PCP or Specialist Appt within 3-5 Days   Complete  HRI or [place]   Complete  Social Work Consult for [place] Planning/Counseling   Complete  Palliative Care Screening   Not Applicable  Medication Review Press photographer) Complete Complete Complete  PCP or Specialist appointment within 3-5 days of discharge Not Complete    HRI or [place] Complete Complete   SW Recovery Care/Counseling Consult Complete Complete   Palliative Care Screening Not Applicable Not [place] Patient Refused Not Applicable

## 2022-04-06 NOTE — Care Management (Signed)
Patient is refusing to return to [facility] he wants to go to his residence. Called daughter, phone not avaialbe for calls. Patient will need oxygen set up  at home, he also has stated that he cannot stand to get on [facility]. Father [name] called left confidential voice message to call this RNCM back

## 2022-04-06 NOTE — ED Provider Notes (Signed)
[facility] EMERGENCY DEPARTMENT Provider Note   CSN: [id number] Arrival date & time: 04/06/22  2979     History  Chief Complaint  Patient presents with   Shortness of Breath    Tony Sullivan is a 61 y.o. male.  61 year old male presents the ER today for shortness of breath.  Apparently patient was in any pain for a few days and was discharged to the facility yesterday.  States that he started getting shortness of breath around midnight and asked for breathing treatment did not bring it to until 4:00 he states that was too long.  Patient states that this will cause his shortness of breath and a lot worse and thus he presents here for further evaluation.  No fevers.  No other associated symptoms.   Shortness of Breath      Home Medications Prior to Admission medications   Medication Sig Start Date End Date Taking? Authorizing Provider  acetaminophen (TYLENOL) 325 MG tablet Take 2 tablets (650 mg total) by mouth every 6 (six) hours as needed for mild pain (or Fever >/= 101). 10/30/21  Yes Emokpae, Courage, MD  albuterol (PROVENTIL) (2.5 MG/3ML) 0.083% nebulizer solution Take 3 mLs (2.5 mg total) by nebulization every 4 (four) hours as needed for wheezing or shortness of breath. 12/12/21 12/12/22 Yes [name], MD  albuterol (VENTOLIN HFA) 108 (90 Base) MCG/ACT inhaler Inhale 2 puffs into the lungs every 6 (six) hours as needed for wheezing or shortness of breath. 03/15/22  Yes Shah, Pratik D, DO  benztropine (COGENTIN) 0.5 MG tablet Take 0.5 mg by mouth at bedtime.   Yes [provider]  Budeson-Glycopyrrol-Formoterol (BREZTRI AEROSPHERE) 160-9-4.8 MCG/ACT AERO Inhale 2 puffs into the lungs in the morning and at bedtime. 12/12/21  Yes [name], MD  diclofenac Sodium (VOLTAREN) 1 % GEL Apply 2 g topically 4 (four) times daily as needed (pain).   Yes [provider]  dorzolamide (TRUSOPT) 2 % ophthalmic solution Place 1 drop into the left eye  2 (two) times daily. 12/12/20  Yes [provider]  apixaban (ELIQUIS) 5 MG TABS tablet Take 1 tablet (5 mg total) by mouth 2 (two) times daily. 03/22/22 05/21/22  [name], Pratik D, DO  atorvastatin (LIPITOR) 40 MG tablet TAKE ONE TABLET BY MOUTH DAILY Patient taking differently: Take 40 mg by mouth daily. 07/06/20   [name], MD  furosemide (LASIX) 20 MG tablet Take 1 tablet (20 mg total) by mouth daily. 04/06/22   Kathie Dike, MD  gabapentin (NEURONTIN) 100 MG capsule Take 1 capsule (100 mg total) by mouth 3 (three) times daily. 03/25/22 05/24/22  Deatra James, MD  guaiFENesin 200 MG tablet Take 2 tablets (400 mg total) by mouth every 6 (six) hours as needed for cough or to loosen phlegm. 03/02/22   Orson Eva, MD  hydrOXYzine (ATARAX) 25 MG tablet Take 1 tablet (25 mg total) by mouth 3 (three) times daily as needed for anxiety. 04/05/22   Kathie Dike, MD  latanoprost (XALATAN) 0.005 % ophthalmic solution Place 1 drop into both eyes at bedtime. 05/24/19   [provider]  loxapine (LOXITANE) 10 MG capsule Take 10 mg by mouth at bedtime.     [provider]  mirtazapine (REMERON) 30 MG tablet Take 1 tablet (30 mg total) by mouth at bedtime. 10/30/21   [name], MD  Multiple Vitamin (MULTIVITAMIN WITH MINERALS) TABS tablet Take 1 tablet by mouth daily. 10/31/21   Emokpae, Courage,  MD  OXYGEN Inhale 3-4 L into the lungs as directed. 3-4 liters with sleep and exertion as needed for low oxygen    [provider]  pantoprazole (PROTONIX) 40 MG tablet Take 1 tablet (40 mg total) by mouth daily. 10/30/21   [name], MD  polyethylene glycol (MIRALAX / GLYCOLAX) 17 g packet TAKE 17 GRAMS BY MOUTH DAILY Patient taking differently: Take 17 g by mouth daily as needed for mild constipation. 03/09/20   [name], MD  potassium chloride (KLOR-CON M) 20 MEQ tablet Take 1 tablet (20 mEq total) by mouth daily. 04/05/22   Kathie Dike, MD  predniSONE (DELTASONE)  10 MG tablet Take 40mg  po daily for 2 days then 30mg  daily for 2 days then 20mg  daily for 2 days then 10mg  daily for 2 days then stop 04/05/22   Kathie Dike, MD  senna-docusate (SENOKOT-S) 8.6-50 MG tablet Take 2 tablets by mouth at bedtime. Patient taking differently: Take 2 tablets by mouth at bedtime as needed for mild constipation. 10/30/21   [name], MD  simethicone (MYLICON) 80 MG chewable tablet Chew 1 tablet (80 mg total) by mouth 4 (four) times daily as needed for flatulence. 03/15/22   [name], Pratik D, DO      Allergies    Penicillins and Levocetirizine    Review of Systems   Review of Systems  Respiratory:  Positive for shortness of breath.     Physical Exam Updated Vital Signs BP (!) 136/95   Pulse 83   Temp 98.2 F (36.8 C) (Oral)   Resp 14   Ht 5\' 11"  (1.803 m)   SpO2 100%   BMI 18.11 kg/m  Physical Exam Vitals and nursing note reviewed.  Constitutional:      Appearance: He is well-developed.  HENT:     Head: Normocephalic and atraumatic.  Cardiovascular:     Rate and Rhythm: Normal rate.  Pulmonary:     Effort: Pulmonary effort is normal. No respiratory distress.     Breath sounds: Decreased breath sounds and wheezing present.  Abdominal:     General: There is no distension.  Musculoskeletal:        General: Normal range of motion.     Cervical back: Normal range of motion.     Right lower leg: No edema.     Left lower leg: No edema.  Skin:    General: Skin is warm and dry.  Neurological:     General: No focal deficit present.     Mental Status: He is alert.     ED Results / Procedures / Treatments   Labs (all labs ordered are listed, but only abnormal results are displayed) Labs Reviewed  CBC WITH DIFFERENTIAL/PLATELET - Abnormal; Notable for the following components:      Result Value   WBC 14.9 (*)    RBC 4.14 (*)    MCV 103.4 (*)    nRBC 0.7 (*)    Neutro Abs 11.8 (*)    nRBC 2 (*)    Abs Immature Granulocytes 1.00 (*)    All  other components within normal limits  COMPREHENSIVE METABOLIC PANEL - Abnormal; Notable for the following components:   CO2 34 (*)    Total Protein 5.7 (*)    Albumin 2.8 (*)    Alkaline Phosphatase 147 (*)    All other components within normal limits  TROPONIN I (HIGH SENSITIVITY) - Abnormal; Notable for the following components:   Troponin I (High Sensitivity) 24 (*)  All other components within normal limits  BRAIN NATRIURETIC PEPTIDE    EKG None  Radiology No results found.  Procedures Procedures    Medications Ordered in ED Medications  ipratropium-albuterol (DUONEB) 0.5-2.5 (3) MG/3ML nebulizer solution 3 mL (has no administration in time range)  methylPREDNISolone sodium succinate (SOLU-MEDROL) 125 mg/2 mL injection 125 mg (125 mg Intravenous Given 04/06/22 0542)    ED Course/ Medical Decision Making/ A&P                           Medical Decision Making Amount and/or Complexity of Data Reviewed Labs: ordered. Radiology: ordered.  Risk Prescription drug management.  Patient is pretty well-known to our department for similar symptoms.  I seen him in much worse shape than he is now I feel that this is probably close to his baseline.  He is complaining of some chest pressure so we will evaluate for cardiac work-up.  We will also going give him some breathing treatments and some prednisone a supposed to be on daily.  Suspect patient can likely be discharged if he rules out for ACS.  May need have our social worker talk to his case manager as the patient wants to live somewhere else.  I discussed with him this not something we can keep him in the emergency room for.  He stated understanding. Care transferred pending second troponin and reeval for likely discharge.   TOC may need involved to communicate with his case manager if he truly wants to be moved somewhere else.   Final Clinical Impression(s) / ED Diagnoses Final diagnoses:  None    Rx / DC Orders ED  Discharge Orders     None         Tabari Volkert, [name], MD 04/07/22 0002

## 2022-04-06 NOTE — TOC Initial Note (Addendum)
Transition of Care [facility]) - Initial/Assessment Note    Patient Details  Name: Tony Sullivan MRN: [id number] Date of Birth: 1961/02/10  Transition of Care [facility]) CM/SW Contact:    Tony Carmine, RN Phone Number: 04/06/2022, 11:12 AM  Clinical Narrative:                 Patient interviewed regarding why he is insiting on going  home. He states at [facility] he was very anxious, they did not come when he pressed the call button. He yelled out for help after waiting a hour and multiple calls. A "[place] nurse"" came in and told him that he could not yell out and if he had been on her side of the floor he wouldn't be talking". Patient asked for a breathing treament at midnight and did not recdeive one until 0400.Marland Kitchen He does not feel safe there, therefore wants to go home. Spoke about someone beign there receiving him. His key is in the house but his father has a key. Attampted to call  Mr Tony Sullivan again. Patient stated he has a wheelchair and BSC. He has a aide that comes out 3 hours a day and has cosygen set up at home. He is followed by [facility]  palliative service, will mae sure they have a order. As soon as his father answers back, he can be discharged home via [place]. He will need to call his APCP for additional PCS services. He asked for help with CAP. Provider / team notified of plan.  Patient has someone at home, confirmed with Tony Sullivan ( fiance) patient called pastor to assist.  Expected Discharge Plan: [place]     Patient Goals and CMS Choice        Expected Discharge Plan and Services Expected Discharge Plan: [place] arrangements for the past 2 months: [place]:  (Has an aide 3 hours a day)          Prior Living Arrangements/Services Living arrangements for the past 2 months: Single Family Home Lives with:: Self Patient language and  need for interpreter reviewed:: Yes Do you feel safe going back to the place where you live?: Yes      Need for Family Participation in Patient Care: Yes (Comment) Care giver support system in place?: Yes (comment)   Criminal Activity/Legal Involvement Pertinent to Current Situation/Hospitalization: No - Comment as needed  Activities of Daily Living      Permission Sought/Granted                  Emotional Assessment     Affect (typically observed): Anxious Orientation: : Oriented to Self, Oriented to Place, Oriented to  Time, Oriented to Situation Alcohol / Substance Use: Not Applicable Psych Involvement: No (comment)  Admission diagnosis:  sob Patient Active Problem List   Diagnosis Date Noted   Squamous cell lung cancer 04/01/2022   H/o Pulmonary embolism ([place]) 04/01/2022   Mass of upper lobe of left lung    Thoracic compression fracture ([place]) 03/20/2022   Hemoptysis 03/20/2022   Pneumonia 03/13/2022   COPD exacerbation ([place]) 02/27/2022   Pedal edema 02/19/2022   CAP (community acquired pneumonia) 01/07/2022   Prolonged QT interval 01/07/2022   Glaucoma 01/07/2022  Elevated troponin 10/27/2021   Hyperglycemia 10/27/2021   Elevated MCV 10/27/2021   Respiratory failure with hypoxia and hypercapnia ([place]) 10/27/2021   Acute exacerbation of chronic obstructive pulmonary disease (COPD) ([place]) 10/02/2021   Hypophosphatemia 10/02/2021   Hypoalbuminemia due to protein-calorie malnutrition ([place]) 10/02/2021   Physical deconditioning 07/26/2021   Numbness and tingling in left arm 05/06/2021   Leukocytosis 05/05/2021   Severe headache 05/05/2021   Mixed hyperlipidemia 05/24/2020   Hypokalemia 05/24/2020   Chest pain 05/23/2020   Bipolar disorder ([place]) 05/23/2020   HNP (herniated nucleus pulposus), cervical 09/06/2019   Abdominal pain    Poor dentition 11/04/2017   Acute on chronic respiratory failure with hypercapnia ([place]) 08/29/2017   ETOH abuse 03/30/2017    Cigarette smoker 03/30/2017   Acute respiratory failure with hypoxia and hypercapnia (HCC) 03/29/2017   Special screening for malignant neoplasms, colon    Benign neoplasm of ascending colon    Benign neoplasm of descending colon    Hoarseness 08/22/2016   GERD (gastroesophageal reflux disease) 08/22/2016   Atypical chest pain 12/05/2015   Essential hypertension 06/15/2014   Pectoralis muscle strain 01/11/2014   COPD with acute exacerbation ([place]) 01/10/2014   COPD (chronic obstructive pulmonary disease) ([place]) 01/08/2014   Tobacco abuse 01/08/2014   Cough 01/08/2014   Chronic respiratory failure with hypoxia ([place]) 01/08/2014   PCP:  Tony Drafts, FNP Pharmacy:   [facility], [address] AT [address]. [place] [address] Phone: [phone] Fax: [phone]     Social Determinants of Health (SDOH) Interventions    Readmission Risk Interventions    03/14/2022    3:54 PM 02/28/2022   10:40 AM 09/08/2019    3:22 PM  Readmission Risk Prevention Plan  Transportation Screening Complete Complete Complete  PCP or Specialist Appt within 3-5 Days   Complete  HRI or [place]   Complete  Social Work Consult for [place] Planning/Counseling   Complete  Palliative Care Screening   Not Applicable  Medication Review Press photographer) Complete Complete Complete  PCP or Specialist appointment within 3-5 days of discharge Not Complete    HRI or [place] Complete Complete   SW Recovery Care/Counseling Consult Complete Complete   Palliative Care Screening Not Applicable Not [place] Patient Refused Not Applicable

## 2022-04-06 NOTE — Discharge Instructions (Signed)
Follow-up with your primary care doctor.  Come back to ER if you are having chest pain, shortness of breath, fever, or other new concerning symptom.   Future Appointments  Date Time Provider [place]  04/11/2022 10:45 AM AP-PET CT 1 AP-NM [place] [facility]  04/16/2022  1:30 PM Derek Jack, MD AP-ACAPA None  04/17/2022  2:30 PM [name] Christena Deem, MD LBPU-RDS None

## 2022-04-08 LAB — SURGICAL PATHOLOGY

## 2022-04-10 ENCOUNTER — Encounter (HOSPITAL_COMMUNITY): Payer: Self-pay

## 2022-04-10 DIAGNOSIS — C3490 Malignant neoplasm of unspecified part of unspecified bronchus or lung: Secondary | ICD-10-CM

## 2022-04-10 NOTE — Progress Notes (Signed)
I spoke with the patient today regarding anticipated issues. Patient states that he can get a ride to his appointment with Dr. Delton Coombes but cannot do PET scan at [facility] or get to [facility] for PET scan appt due to transportation and the fact that he is currently bedbound. Encouraged patient to keep appointment with Dr. Delton Coombes to discuss further. Referral to LCSW made regarding transportation issues. Patient aware and agreeable.

## 2022-04-11 ENCOUNTER — Other Ambulatory Visit (HOSPITAL_COMMUNITY): Payer: Medicaid Other

## 2022-04-11 ENCOUNTER — Encounter: Payer: Self-pay | Admitting: Licensed Clinical Social Worker

## 2022-04-11 DIAGNOSIS — C3490 Malignant neoplasm of unspecified part of unspecified bronchus or lung: Secondary | ICD-10-CM

## 2022-04-11 NOTE — Progress Notes (Signed)
[place] CSW Progress Note  Clinical Education officer, museum received referral from RN regarding pt's transportation concerns.  CSW contacted [facility] and verified pt should have Medicaid transportation benefits, but will need to call [facility] [phone]) himself to set them up.  After benefits are set up pt will be able to request ambulance transport due to his bed bound status and [facility] will instruct patient as to how to do that.  CSW attempted to contact pt to instruct; however, pt did not answer.  Voice message left w/ CSW's contact information.  CSW provided RN w/ the above information to provide to pt as well if he should contact the office as he will need to set up Medicaid transportation no less than 3 days in advance of appointment.  CSW to remain available to assist as appropriate throughout duration of treatment.    Henriette Combs, LCSW

## 2022-04-12 ENCOUNTER — Emergency Department (HOSPITAL_COMMUNITY): Payer: Medicaid Other

## 2022-04-12 ENCOUNTER — Encounter (HOSPITAL_COMMUNITY): Payer: Self-pay

## 2022-04-12 ENCOUNTER — Emergency Department (HOSPITAL_COMMUNITY)
Admission: EM | Admit: 2022-04-12 | Discharge: 2022-04-12 | Disposition: A | Discharge status: Home / Self Care | Payer: Medicaid Other | Attending: Emergency Medicine | Admitting: Emergency Medicine

## 2022-04-12 ENCOUNTER — Other Ambulatory Visit: Payer: Self-pay

## 2022-04-12 ENCOUNTER — Emergency Department (HOSPITAL_COMMUNITY)
Admission: EM | Admit: 2022-04-12 | Discharge: 2022-04-14 | Disposition: A | Discharge status: Home / Self Care | Payer: Medicaid Other | Source: Home / Self Care

## 2022-04-12 ENCOUNTER — Encounter: Payer: Self-pay | Admitting: Licensed Clinical Social Worker

## 2022-04-12 DIAGNOSIS — Z7952 Long term (current) use of systemic steroids: Secondary | ICD-10-CM | POA: Insufficient documentation

## 2022-04-12 DIAGNOSIS — Z79899 Other long term (current) drug therapy: Secondary | ICD-10-CM | POA: Insufficient documentation

## 2022-04-12 DIAGNOSIS — R0600 Dyspnea, unspecified: Secondary | ICD-10-CM | POA: Diagnosis not present

## 2022-04-12 DIAGNOSIS — R0602 Shortness of breath: Secondary | ICD-10-CM

## 2022-04-12 DIAGNOSIS — I10 Essential (primary) hypertension: Secondary | ICD-10-CM | POA: Insufficient documentation

## 2022-04-12 DIAGNOSIS — Z7901 Long term (current) use of anticoagulants: Secondary | ICD-10-CM | POA: Insufficient documentation

## 2022-04-12 DIAGNOSIS — Z87891 Personal history of nicotine dependence: Secondary | ICD-10-CM | POA: Insufficient documentation

## 2022-04-12 DIAGNOSIS — R600 Localized edema: Secondary | ICD-10-CM | POA: Insufficient documentation

## 2022-04-12 DIAGNOSIS — Z7951 Long term (current) use of inhaled steroids: Secondary | ICD-10-CM | POA: Insufficient documentation

## 2022-04-12 DIAGNOSIS — J449 Chronic obstructive pulmonary disease, unspecified: Secondary | ICD-10-CM | POA: Diagnosis not present

## 2022-04-12 DIAGNOSIS — R0789 Other chest pain: Secondary | ICD-10-CM

## 2022-04-12 DIAGNOSIS — R079 Chest pain, unspecified: Secondary | ICD-10-CM | POA: Diagnosis present

## 2022-04-12 DIAGNOSIS — C3490 Malignant neoplasm of unspecified part of unspecified bronchus or lung: Secondary | ICD-10-CM

## 2022-04-12 LAB — CBC WITH DIFFERENTIAL/PLATELET
Abs Immature Granulocytes: 0.34 10*3/uL — ABNORMAL HIGH (ref 0.00–0.07)
Abs Immature Granulocytes: 0.32 10*3/uL — ABNORMAL HIGH (ref 0.00–0.07)
Basophils Absolute: 0.1 10*3/uL (ref 0.0–0.1)
Basophils Absolute: 0.1 10*3/uL (ref 0.0–0.1)
Basophils Relative: 1 %
Basophils Relative: 1 %
Eosinophils Absolute: 0 10*3/uL (ref 0.0–0.5)
Eosinophils Absolute: 0 10*3/uL (ref 0.0–0.5)
Eosinophils Relative: 0 %
Eosinophils Relative: 0 %
HCT: 37.7 % — ABNORMAL LOW (ref 39.0–52.0)
HCT: 36.1 % — ABNORMAL LOW (ref 39.0–52.0)
Hemoglobin: 11.7 g/dL — ABNORMAL LOW (ref 13.0–17.0)
Hemoglobin: 11.1 g/dL — ABNORMAL LOW (ref 13.0–17.0)
Immature Granulocytes: 3 %
Immature Granulocytes: 3 %
Lymphocytes Relative: 7 %
Lymphocytes Relative: 5 %
Lymphs Abs: 0.8 10*3/uL (ref 0.7–4.0)
Lymphs Abs: 0.5 10*3/uL — ABNORMAL LOW (ref 0.7–4.0)
MCH: 32.4 pg (ref 26.0–34.0)
MCH: 31.5 pg (ref 26.0–34.0)
MCHC: 31 g/dL (ref 30.0–36.0)
MCHC: 30.7 g/dL (ref 30.0–36.0)
MCV: 104.4 fL — ABNORMAL HIGH (ref 80.0–100.0)
MCV: 102.6 fL — ABNORMAL HIGH (ref 80.0–100.0)
Monocytes Absolute: 0.8 10*3/uL (ref 0.1–1.0)
Monocytes Absolute: 0.5 10*3/uL (ref 0.1–1.0)
Monocytes Relative: 7 %
Monocytes Relative: 5 %
Neutro Abs: 10.3 10*3/uL — ABNORMAL HIGH (ref 1.7–7.7)
Neutro Abs: 9.1 10*3/uL — ABNORMAL HIGH (ref 1.7–7.7)
Neutrophils Relative %: 82 %
Neutrophils Relative %: 86 %
Platelets: 212 10*3/uL (ref 150–400)
Platelets: 220 10*3/uL (ref 150–400)
RBC: 3.61 MIL/uL — ABNORMAL LOW (ref 4.22–5.81)
RBC: 3.52 MIL/uL — ABNORMAL LOW (ref 4.22–5.81)
RDW: 16 % — ABNORMAL HIGH (ref 11.5–15.5)
RDW: 16 % — ABNORMAL HIGH (ref 11.5–15.5)
WBC: 12.4 10*3/uL — ABNORMAL HIGH (ref 4.0–10.5)
WBC: 10.5 10*3/uL (ref 4.0–10.5)
nRBC: 0.2 % (ref 0.0–0.2)
nRBC: 0.2 % (ref 0.0–0.2)

## 2022-04-12 LAB — TROPONIN I (HIGH SENSITIVITY)
Troponin I (High Sensitivity): 18 ng/L — ABNORMAL HIGH (ref <18)
Troponin I (High Sensitivity): 18 ng/L — ABNORMAL HIGH (ref <18)
Troponin I (High Sensitivity): 20 ng/L — ABNORMAL HIGH (ref <18)
Troponin I (High Sensitivity): 19 ng/L — ABNORMAL HIGH (ref <18)

## 2022-04-12 LAB — BASIC METABOLIC PANEL
Anion gap: 7 (ref 5–15)
Anion gap: 6 (ref 5–15)
BUN: 23 mg/dL — ABNORMAL HIGH (ref 6–20)
BUN: 21 mg/dL — ABNORMAL HIGH (ref 6–20)
CO2: 33 mmol/L — ABNORMAL HIGH (ref 22–32)
CO2: 35 mmol/L — ABNORMAL HIGH (ref 22–32)
Calcium: 8.8 mg/dL — ABNORMAL LOW (ref 8.9–10.3)
Calcium: 8.9 mg/dL (ref 8.9–10.3)
Chloride: 103 mmol/L (ref 98–111)
Chloride: 104 mmol/L (ref 98–111)
Creatinine, Ser: 0.81 mg/dL (ref 0.61–1.24)
Creatinine, Ser: 0.82 mg/dL (ref 0.61–1.24)
GFR, Estimated: >60 mL/min (ref >60)
GFR, Estimated: >60 mL/min (ref >60)
Glucose, Bld: 79 mg/dL (ref 70–99)
Glucose, Bld: 125 mg/dL — ABNORMAL HIGH (ref 70–99)
Potassium: 3.3 mmol/L — ABNORMAL LOW (ref 3.5–5.1)
Potassium: 3.6 mmol/L (ref 3.5–5.1)
Sodium: 143 mmol/L (ref 135–145)
Sodium: 145 mmol/L (ref 135–145)

## 2022-04-12 IMAGING — DX DG CHEST 1V PORT
2 series · 2 of 2 positions shown · non-contrast
Comparison: Same day chest x-ray

CLINICAL DATA: Pain

EXAM:
PORTABLE CHEST 1 VIEW

[chest ap (1 of 2)]
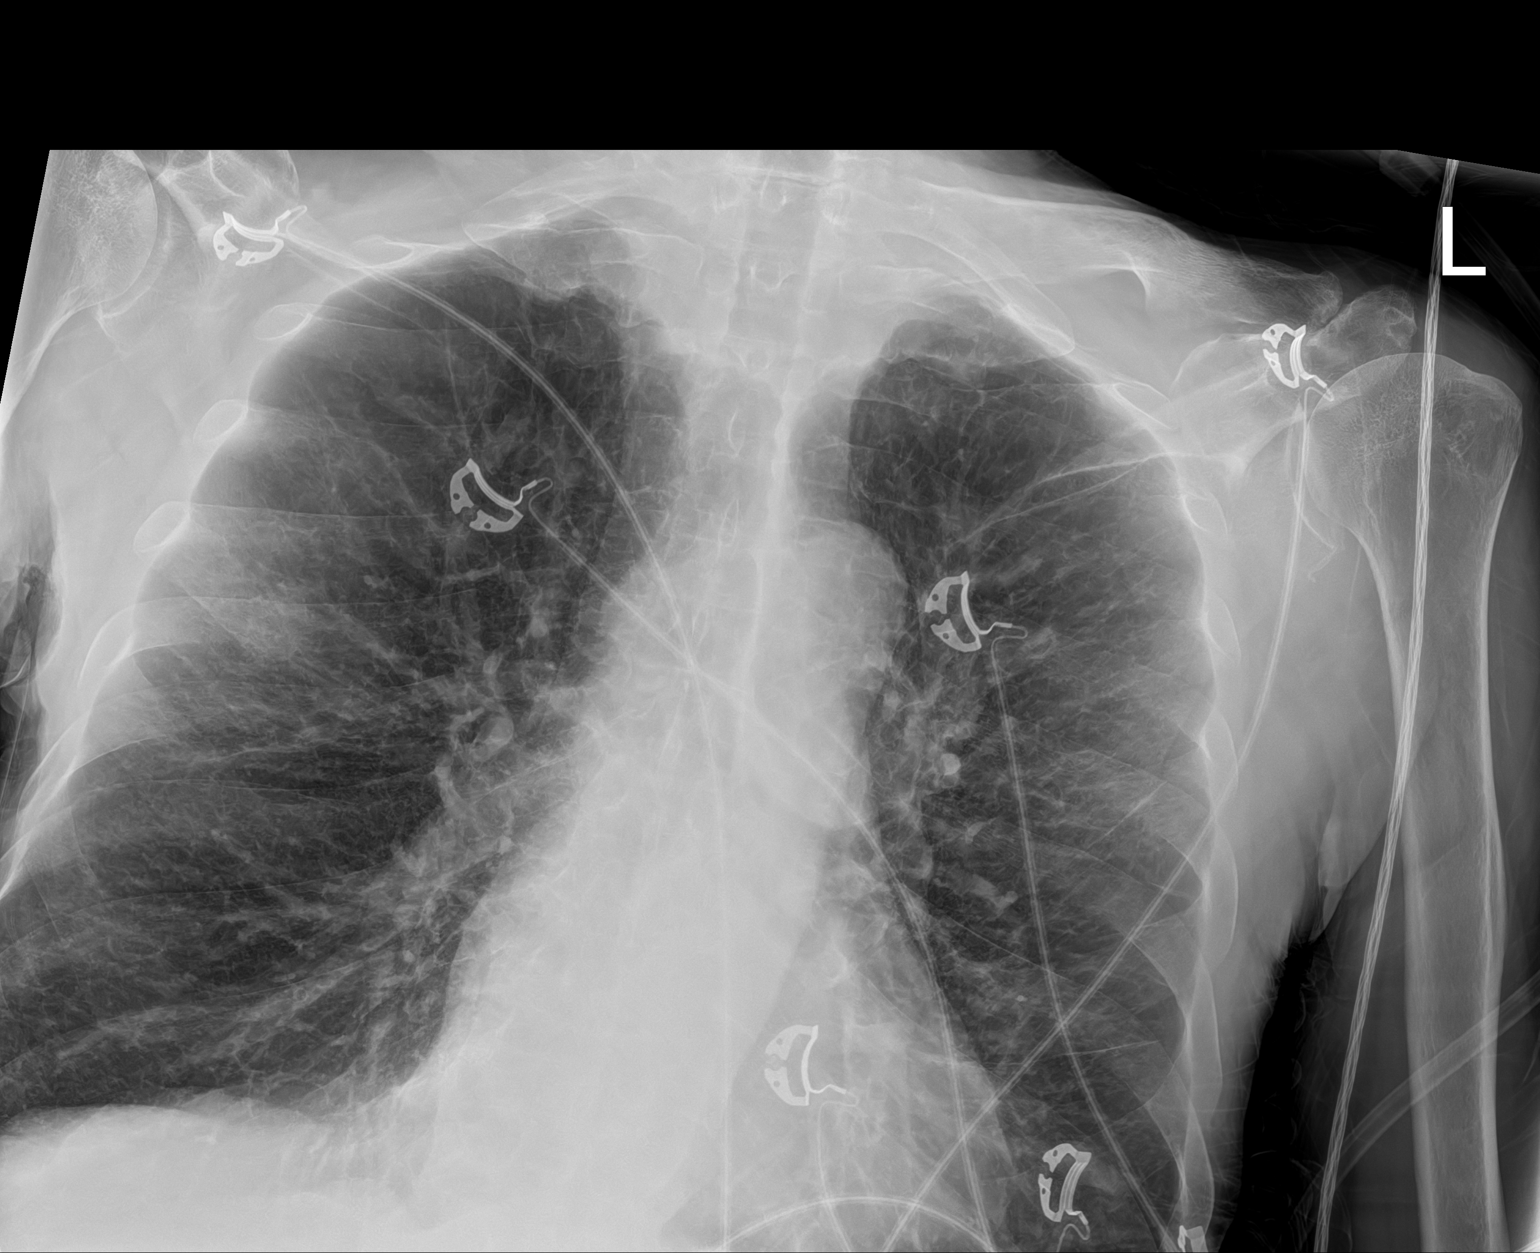

[chest ap (2 of 2)]
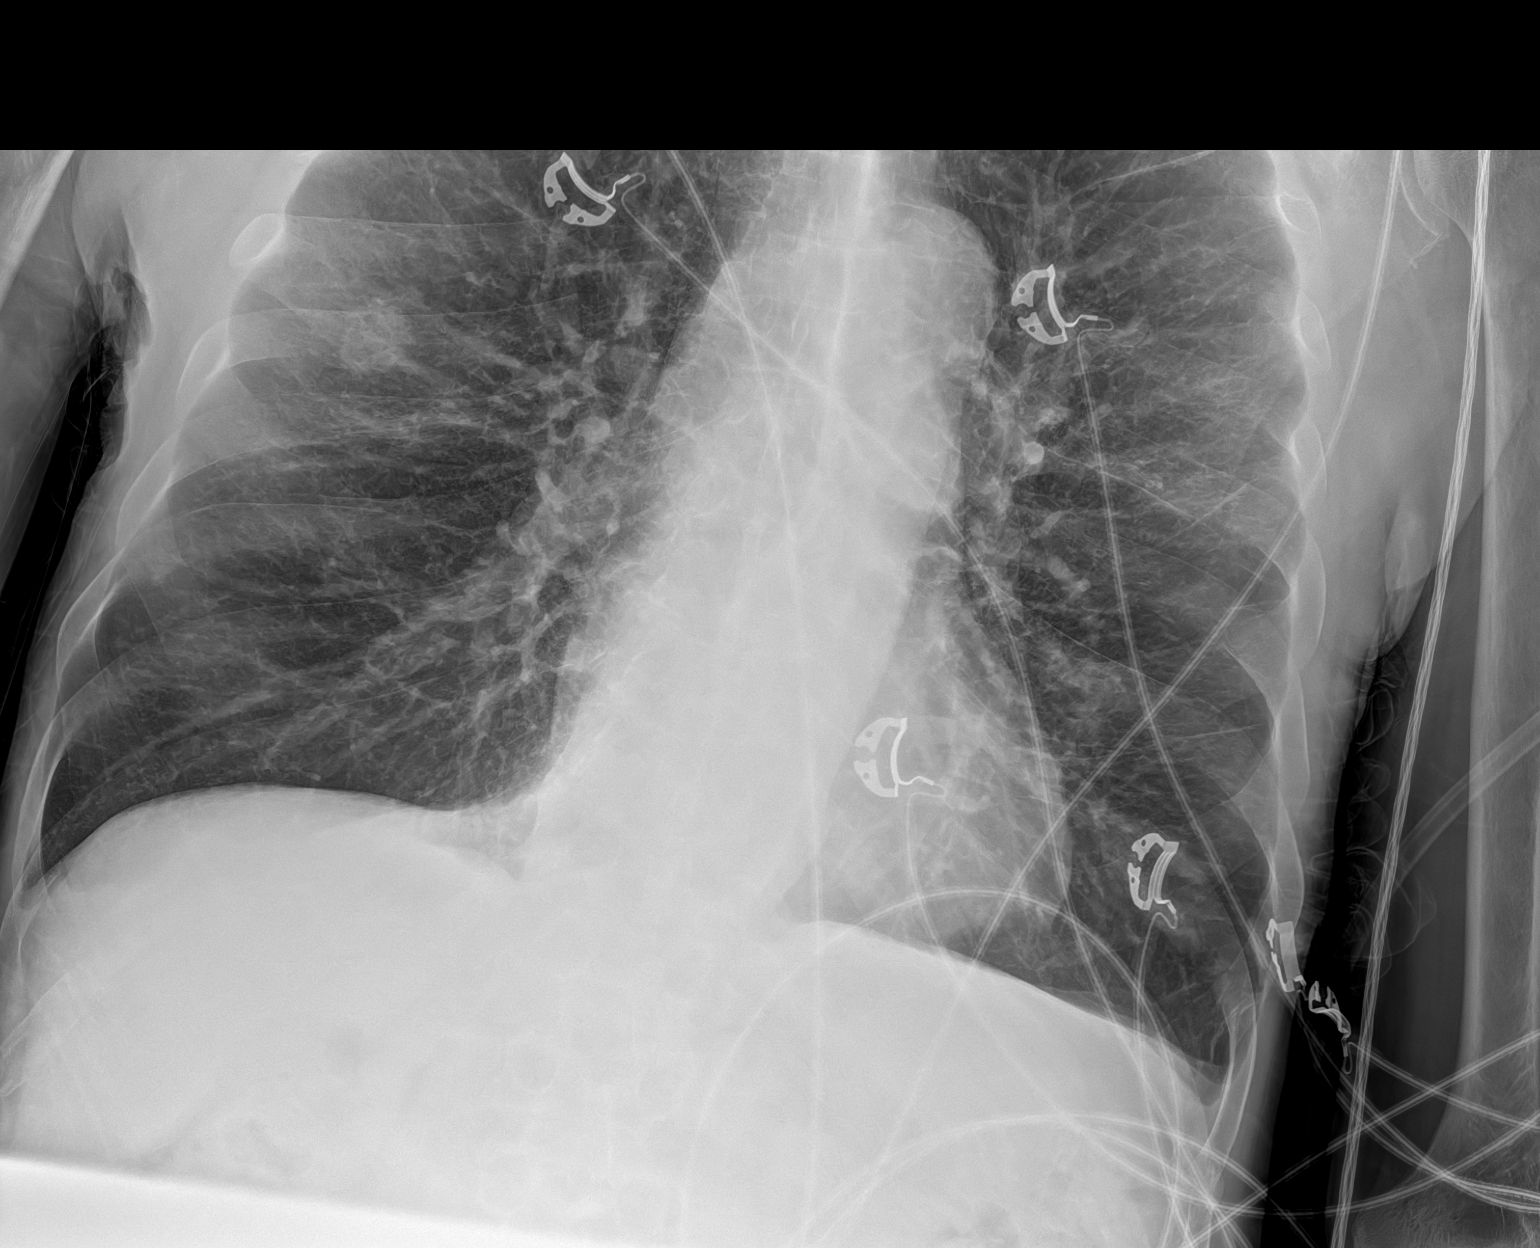

[2 of 2 positions shown; findings below may reference images not displayed]

FINDINGS: Stable cardiomediastinal contours. Chronic emphysematous lung
changes. No new focal airspace consolidation, pleural effusion, or
pneumothorax. Chronic posterior right sixth rib fracture.
IMPRESSION: No active disease.

## 2022-04-12 IMAGING — DX DG CHEST 1V PORT
1 series · 1 of 1 positions shown · non-contrast
Comparison: Chest x-ray [DATE].

CLINICAL DATA: 60-year-old male with history of shortness of breath
and chest pain.

EXAM:
PORTABLE CHEST 1 VIEW

[chest ap]
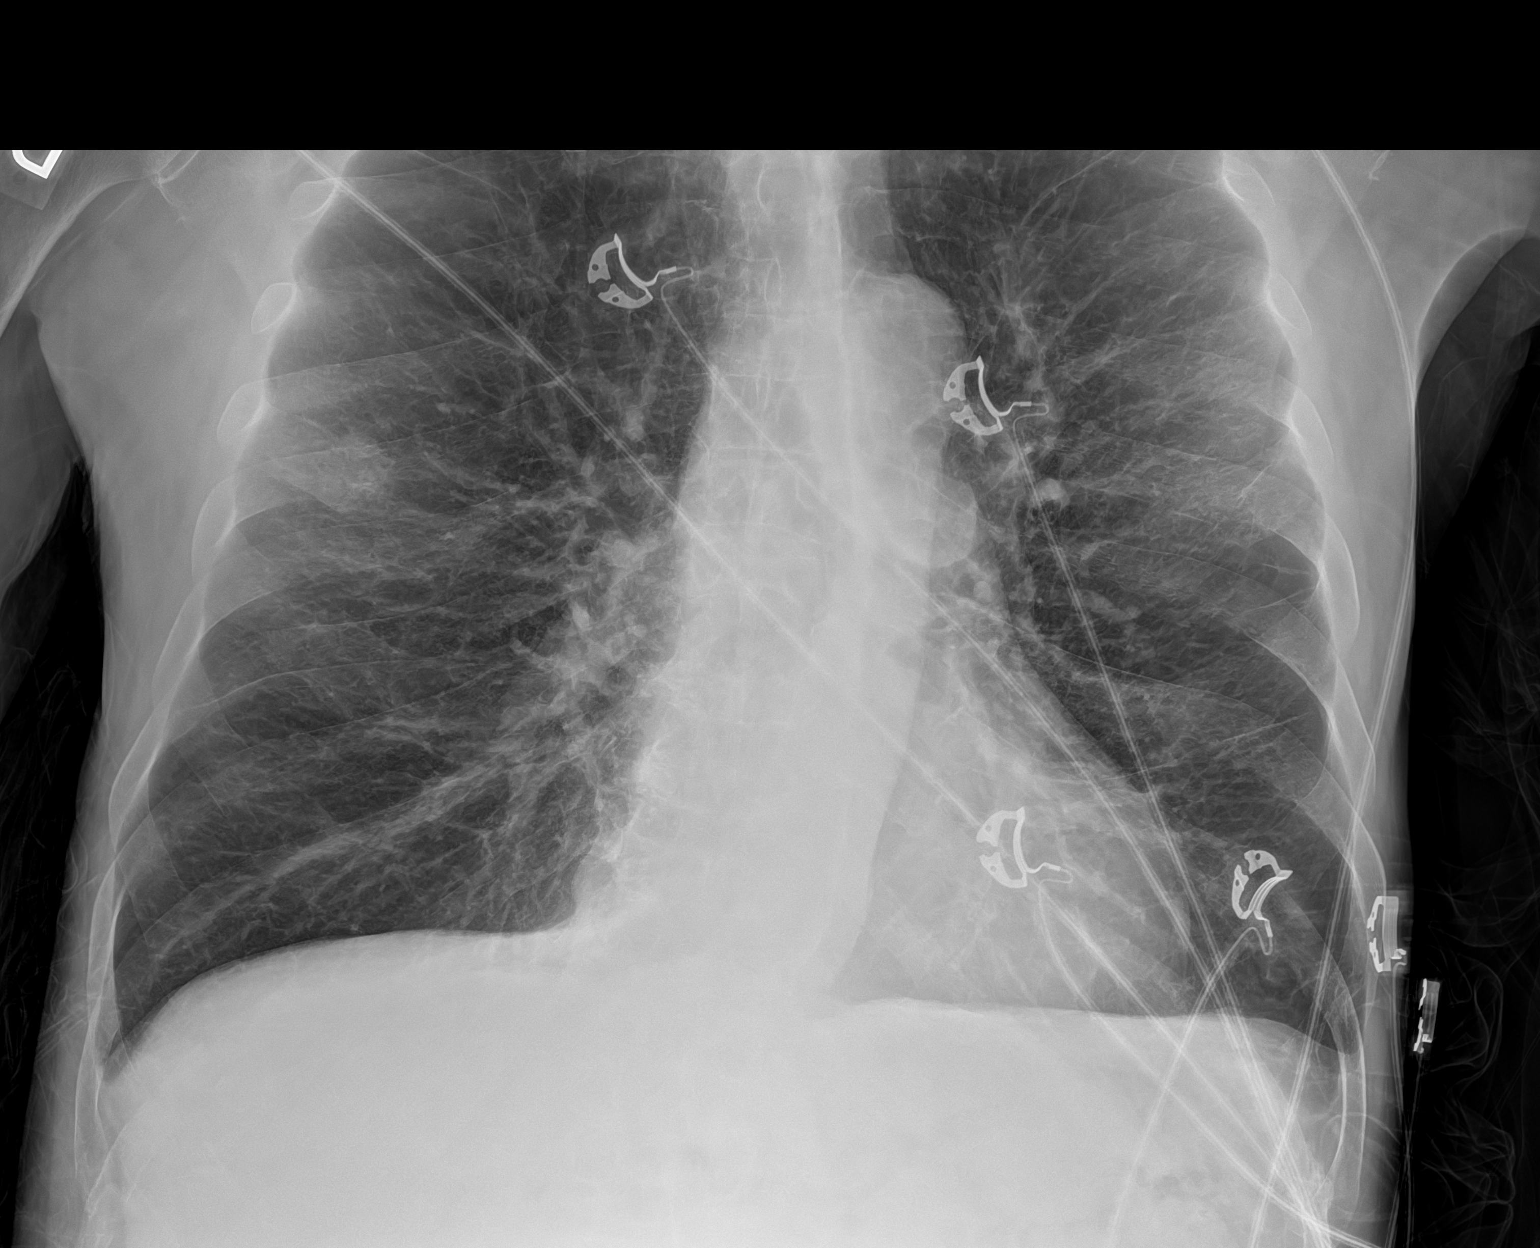

[1 of 1 positions shown; findings below may reference images not displayed]

FINDINGS: Lung volumes are normal. No consolidative airspace disease. No
pleural effusions. No pneumothorax. No pulmonary nodule or mass
noted. Pulmonary vasculature and the cardiomediastinal silhouette
are within normal limits. Atherosclerosis in the thoracic aorta. Old
healed fracture of the posterolateral aspect of the right sixth rib
incidentally noted.
IMPRESSION: 1. No radiographic evidence of acute cardiopulmonary disease.
2. Aortic atherosclerosis.

## 2022-04-12 MED ORDER — ALBUTEROL SULFATE (2.5 MG/3ML) 0.083% IN NEBU
2.5000 mg | INHALATION_SOLUTION | Freq: Once | RESPIRATORY_TRACT | Status: AC
  Administered 2022-04-12: 2.5 mg via RESPIRATORY_TRACT
  Filled 2022-04-12: qty 3

## 2022-04-12 MED ORDER — POTASSIUM CHLORIDE CRYS ER 20 MEQ PO TBCR
40.0000 meq | EXTENDED_RELEASE_TABLET | Freq: Once | ORAL | Status: AC
  Administered 2022-04-12: 40 meq via ORAL
  Filled 2022-04-12: qty 2

## 2022-04-12 MED ORDER — ACETAMINOPHEN 325 MG PO TABS
650.0000 mg | ORAL_TABLET | Freq: Once | ORAL | Status: AC
  Administered 2022-04-12: 650 mg via ORAL
  Filled 2022-04-12: qty 2

## 2022-04-12 MED ORDER — HYDROXYZINE HCL 25 MG PO TABS
25.0000 mg | ORAL_TABLET | Freq: Once | ORAL | Status: AC
  Administered 2022-04-12: 25 mg via ORAL
  Filled 2022-04-12: qty 1

## 2022-04-12 MED ORDER — PREDNISONE 20 MG PO TABS
20.0000 mg | ORAL_TABLET | Freq: Two times a day (BID) | ORAL | 0 refills | Status: AC
Start: 2022-04-12 — End: 2022-04-17

## 2022-04-12 NOTE — ED Notes (Signed)
Called and spoke with Tony Sullivan, Education officer, museum, to inform her pt would like to speak to her.  Phone given to pt.  Christina informed that this RN had spoke with pt's daughter and that "Red" would be the one to come unlock the door to let pt in his house

## 2022-04-12 NOTE — Progress Notes (Signed)
[facility]) Hospital Liaison note:  This is a pending outpatient-based Palliative Care patient. Will continue to follow for disposition.  Please call with any outpatient palliative questions or concerns.  Thank you, Lorelee Market, LPN [facility] Liaison [phone]

## 2022-04-12 NOTE — ED Notes (Signed)
EDP at bedside  

## 2022-04-12 NOTE — ED Provider Notes (Incomplete)
[facility] Provider Note   CSN: [id number] Arrival date & time: 04/12/22  1623     History {Add pertinent medical, surgical, social history, OB history to HPI:1} Chief Complaint  Patient presents with  . Chest Pain    Tony Sullivan is a 61 y.o. male.   Chest Pain Associated symptoms: shortness of breath   Associated symptoms: no abdominal pain, no cough, no dizziness, no fever, no nausea, no numbness, no vomiting and no weakness        Tony Sullivan is a 61 y.o. male with past medical history significant for hypertension, anxiety, depression, COPD prior PE and currently anticoagulated on Eliquis.  Has lung mass and followed by oncology who presents to the Emergency Department for reevaluation of chest pain.  He was seen here earlier this morning for same and discharged home, when he arrived home he states his chest pain returned and he is requesting reevaluation.  He describes pain of his left chest.  Pain worse with breathing.  On patient's prior ER visit, patient stated that he felt like he could not go home due to his chronic conditions, he was offered nursing home placement on his previous hospital admission and wants to be placed in a skilled nursing facility again.  Consultation was placed to transition of care team and advised to follow-up with PCP regarding placement.  He is stating to me that he has tried to contact his PCP but has not been seen for at least 2 months.  He feels he cannot go home as he does not have anyone to help care for him and he feels unsafe at home.  He is requesting nursing home placement again.  Home Medications Prior to Admission medications   Medication Sig Start Date End Date Taking? Authorizing Provider  acetaminophen (TYLENOL) 325 MG tablet Take 2 tablets (650 mg total) by mouth every 6 (six) hours as needed for mild pain (or Fever >/= 101). 10/30/21   [name], MD  albuterol (PROVENTIL) (2.5 MG/3ML) 0.083% nebulizer  solution Take 3 mLs (2.5 mg total) by nebulization every 4 (four) hours as needed for wheezing or shortness of breath. 12/12/21 12/12/22  [name], MD  albuterol (VENTOLIN HFA) 108 (90 Base) MCG/ACT inhaler Inhale 2 puffs into the lungs every 6 (six) hours as needed for wheezing or shortness of breath. 03/15/22   [name], Pratik D, DO  apixaban (ELIQUIS) 5 MG TABS tablet Take 1 tablet (5 mg total) by mouth 2 (two) times daily. 03/22/22 05/21/22  [name], Pratik D, DO  atorvastatin (LIPITOR) 40 MG tablet TAKE ONE TABLET BY MOUTH DAILY Patient taking differently: Take 40 mg by mouth daily. 07/06/20   [name], MD  benztropine (COGENTIN) 0.5 MG tablet Take 0.5 mg by mouth at bedtime.    [provider]  Budeson-Glycopyrrol-Formoterol (BREZTRI AEROSPHERE) 160-9-4.8 MCG/ACT AERO Inhale 2 puffs into the lungs in the morning and at bedtime. 12/12/21   [name], MD  diclofenac Sodium (VOLTAREN) 1 % GEL Apply 2 g topically 4 (four) times daily as needed (pain).    [provider]  dorzolamide (TRUSOPT) 2 % ophthalmic solution Place 1 drop into the left eye 2 (two) times daily. 12/12/20   [provider]  furosemide (LASIX) 20 MG tablet Take 1 tablet (20 mg total) by mouth daily. 04/06/22   Kathie Dike, MD  gabapentin (NEURONTIN) 100 MG capsule Take 1 capsule (100 mg total) by mouth 3 (three) times daily.  03/25/22 05/24/22  Deatra James, MD  guaiFENesin 200 MG tablet Take 2 tablets (400 mg total) by mouth every 6 (six) hours as needed for cough or to loosen phlegm. 03/02/22   Orson Eva, MD  hydrOXYzine (ATARAX) 25 MG tablet Take 1 tablet (25 mg total) by mouth 3 (three) times daily as needed for anxiety. 04/05/22   Kathie Dike, MD  latanoprost (XALATAN) 0.005 % ophthalmic solution Place 1 drop into both eyes at bedtime. 05/24/19   [provider]  loxapine (LOXITANE) 10 MG capsule Take 10 mg by mouth at bedtime.     [provider]  mirtazapine (REMERON)  30 MG tablet Take 1 tablet (30 mg total) by mouth at bedtime. 10/30/21   [name], MD  Multiple Vitamin (MULTIVITAMIN WITH MINERALS) TABS tablet Take 1 tablet by mouth daily. 10/31/21   [name], MD  OXYGEN Inhale 4-5 L into the lungs as directed. 4-5 liters with sleep and exertion as needed for low oxygen    [provider]  pantoprazole (PROTONIX) 40 MG tablet Take 1 tablet (40 mg total) by mouth daily. 10/30/21   Emokpae, Courage, MD  polyethylene glycol (MIRALAX / GLYCOLAX) 17 g packet TAKE 17 GRAMS BY MOUTH DAILY Patient taking differently: Take 17 g by mouth daily. 03/09/20   [name], MD  potassium chloride (KLOR-CON M) 20 MEQ tablet Take 1 tablet (20 mEq total) by mouth daily. 04/05/22   Kathie Dike, MD  predniSONE (DELTASONE) 20 MG tablet Take 1 tablet (20 mg total) by mouth 2 (two) times daily with a meal for 5 days. 04/12/22 04/17/22  Luna Fuse, MD  senna-docusate (SENOKOT-S) 8.6-50 MG tablet Take 2 tablets by mouth at bedtime. Patient taking differently: Take 2 tablets by mouth at bedtime as needed for mild constipation. 10/30/21   [name], MD  simethicone (MYLICON) 80 MG chewable tablet Chew 1 tablet (80 mg total) by mouth 4 (four) times daily as needed for flatulence. Patient taking differently: Chew 80 mg by mouth every 6 (six) hours as needed for flatulence. 03/15/22   [name], Pratik D, DO      Allergies    Penicillins and Levocetirizine    Review of Systems   Review of Systems  Constitutional:  Negative for appetite change, chills and fever.  Respiratory:  Positive for shortness of breath. Negative for cough.   Cardiovascular:  Positive for chest pain.  Gastrointestinal:  Negative for abdominal pain, nausea and vomiting.  Genitourinary:  Negative for dysuria.  Neurological:  Negative for dizziness, weakness and numbness.    Physical Exam Updated Vital Signs BP 118/75   Pulse 80   Temp 98.5 F (36.9 C) (Oral)   Resp 13   Ht 5\' 11"   (1.803 m)   Wt 58.9 kg   SpO2 100%   BMI 18.11 kg/m  Physical Exam Vitals and nursing note reviewed.  Constitutional:      General: He is not in acute distress.    Appearance: Normal appearance. He is not ill-appearing.  Cardiovascular:     Rate and Rhythm: Normal rate and regular rhythm.     Pulses: Normal pulses.  Pulmonary:     Effort: Pulmonary effort is normal.     Comments: Patient on oxygen by nasal cannula at baseline.  No increased work of breathing on my exam.  Lungs are clear to auscultation bilaterally. Chest:     Chest wall: No tenderness.  Abdominal:     Palpations: Abdomen is soft.  Tenderness: There is no abdominal tenderness.  Musculoskeletal:     Right lower leg: No edema.     Left lower leg: Edema present.  Skin:    General: Skin is warm.     Capillary Refill: Capillary refill takes less than 2 seconds.  Neurological:     General: No focal deficit present.     Mental Status: He is alert.     Sensory: No sensory deficit.     Motor: No weakness.     ED Results / Procedures / Treatments   Labs (all labs ordered are listed, but only abnormal results are displayed) Labs Reviewed  CBC WITH DIFFERENTIAL/PLATELET - Abnormal; Notable for the following components:      Result Value   RBC 3.52 (*)    Hemoglobin 11.1 (*)    HCT 36.1 (*)    MCV 102.6 (*)    RDW 16.0 (*)    Neutro Abs 9.1 (*)    Lymphs Abs 0.5 (*)    Abs Immature Granulocytes 0.32 (*)    All other components within normal limits  BASIC METABOLIC PANEL - Abnormal; Notable for the following components:   CO2 35 (*)    Glucose, Bld 125 (*)    BUN 21 (*)    All other components within normal limits  TROPONIN I (HIGH SENSITIVITY) - Abnormal; Notable for the following components:   Troponin I (High Sensitivity) 20 (*)    All other components within normal limits  TROPONIN I (HIGH SENSITIVITY) - Abnormal; Notable for the following components:   Troponin I (High Sensitivity) 19 (*)    All  other components within normal limits    EKG None  Radiology DG Chest Port 1 View  Result Date: 04/12/2022 CLINICAL DATA:  Pain EXAM: PORTABLE CHEST 1 VIEW COMPARISON:  Same day chest x-ray FINDINGS: Stable cardiomediastinal contours. Chronic emphysematous lung changes. No new focal airspace consolidation, pleural effusion, or pneumothorax. Chronic posterior right sixth rib fracture. IMPRESSION: No active disease. Electronically Signed   By: Davina Poke D.O.   On: 04/12/2022 17:12   DG Chest Port 1 View  Result Date: 04/12/2022 CLINICAL DATA:  61 year old male with history of shortness of breath and chest pain. EXAM: PORTABLE CHEST 1 VIEW COMPARISON:  Chest x-ray 04/06/2022. FINDINGS: Lung volumes are normal. No consolidative airspace disease. No pleural effusions. No pneumothorax. No pulmonary nodule or mass noted. Pulmonary vasculature and the cardiomediastinal silhouette are within normal limits. Atherosclerosis in the thoracic aorta. Old healed fracture of the posterolateral aspect of the right sixth rib incidentally noted. IMPRESSION: 1. No radiographic evidence of acute cardiopulmonary disease. 2. Aortic atherosclerosis. Electronically Signed   By: Vinnie Langton M.D.   On: 04/12/2022 06:14    Procedures Procedures  {Document cardiac monitor, telemetry assessment procedure when appropriate:1}  Medications Ordered in ED Medications - No data to display  ED Course/ Medical Decision Making/ A&P                           Medical Decision Making Amount and/or Complexity of Data Reviewed Labs: ordered.    Details: Labs here interpreted by me, troponins remain flat.  Chemistries without significant derangement.  No leukocytosis or significant anemia. Radiology: ordered.    Details: Chest x-ray from earlier today showed no acute cardiopulmonary disease ECG/medicine tests: ordered.    Details: EKG shows sinus rhythm Discussion of management or test interpretation with external  provider(s): Patient reporting to me that  he does not feel safe at home and has no one at home to care for him.  He is requesting nursing home placement.  Social work spoke with him earlier today and he was recommended to follow-up with PCP for possible placement.  Patient states that he has been unable to follow-up with his PCP for several months.  States that he left his prior nursing home due to concern of not having concentrators for his oxygen at the prior facility, he is requesting assistance with placement at another facility.  Risk Prescription drug management.   Patient with multiple ER visits for chest pain and dyspnea.  Seen here earlier today for shortness of breath and some pains of his chest.  He was discharged home and upon arrival home began having worsening left chest pain and return to the ER for evaluation.  Repeat work up this evening reassuring.  Will contact TOC again to reassess.    {Document critical care time when appropriate:1} {Document review of labs and clinical decision tools ie heart score, Chads2Vasc2 etc:1}  {Document your independent review of radiology images, and any outside records:1} {Document your discussion with family members, caretakers, and with consultants:1} {Document social determinants of health affecting pt's care:1} {Document your decision making why or why not admission, treatments were needed:1} Final Clinical Impression(s) / ED Diagnoses Final diagnoses:  None    Rx / DC Orders ED Discharge Orders     None

## 2022-04-12 NOTE — ED Triage Notes (Signed)
Patient presents to ED via [facility]. Pt recently discharged from ED arrives home. C/o left-sided chest pain and anxiety. Patient's blood pressure increased when he arrived home EMS reports blood pressure increased to 162/92 and pt states that he wanted to come back to the hospital

## 2022-04-12 NOTE — ED Notes (Signed)
Pt called this tech " a hateful mother-fucking bitch"  Pt was given 2 packs of graham crackers, and 2 saltines- with 3 peanut butter. Expressed to pt, we wound not be able to offer him additional stacks through out the night, to to very limited stock

## 2022-04-12 NOTE — ED Notes (Signed)
Patient requesting to speak with EDP. EDP made aware.

## 2022-04-12 NOTE — ED Provider Notes (Signed)
[facility] EMERGENCY DEPARTMENT  Provider Note  CSN: [id number] Arrival date & time: 04/12/22 8309  History Chief Complaint  Patient presents with   Chest Pain    JT BRABEC is a 61 y.o. 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 Breathing is about at baseline. No recent cough or fever.    Home Medications Prior to Admission medications   Medication Sig Start Date End Date Taking? Authorizing Provider  acetaminophen (TYLENOL) 325 MG tablet Take 2 tablets (650 mg total) by mouth every 6 (six) hours as needed for mild pain (or Fever >/= 101). 10/30/21   [name], MD  albuterol (PROVENTIL) (2.5 MG/3ML) 0.083% nebulizer solution Take 3 mLs (2.5 mg total) by nebulization every 4 (four) hours as needed for wheezing or shortness of breath. 12/12/21 12/12/22  [name], MD  albuterol (VENTOLIN HFA) 108 (90 Base) MCG/ACT inhaler Inhale 2 puffs into the lungs every 6 (six) hours as needed for wheezing or shortness of breath. 03/15/22   [name], Pratik D, DO  apixaban (ELIQUIS) 5 MG TABS tablet Take 1 tablet (5 mg total) by mouth 2 (two) times daily. 03/22/22 05/21/22  [name], Pratik D, DO  atorvastatin (LIPITOR) 40 MG tablet TAKE ONE TABLET BY MOUTH DAILY Patient taking differently: Take 40 mg by mouth daily. 07/06/20   [name], MD  benztropine (COGENTIN) 0.5 MG tablet Take 0.5 mg by mouth at bedtime.    [provider]  Budeson-Glycopyrrol-Formoterol (BREZTRI AEROSPHERE) 160-9-4.8 MCG/ACT AERO Inhale 2 puffs into the lungs in the morning and at bedtime. 12/12/21   [name], MD  diclofenac Sodium (VOLTAREN) 1 % GEL Apply 2 g topically 4 (four) times daily as needed (pain).    [provider]  dorzolamide (TRUSOPT) 2 % ophthalmic solution Place 1 drop  into the left eye 2 (two) times daily. 12/12/20   [provider]  furosemide (LASIX) 20 MG tablet Take 1 tablet (20 mg total) by mouth daily. 04/06/22   Kathie Dike, MD  gabapentin (NEURONTIN) 100 MG capsule Take 1 capsule (100 mg total) by mouth 3 (three) times daily. 03/25/22 05/24/22  Deatra James, MD  guaiFENesin 200 MG tablet Take 2 tablets (400 mg total) by mouth every 6 (six) hours as needed for cough or to loosen phlegm. 03/02/22   Orson Eva, MD  hydrOXYzine (ATARAX) 25 MG tablet Take 1 tablet (25 mg total) by mouth 3 (three) times daily as needed for anxiety. 04/05/22   Kathie Dike, MD  latanoprost (XALATAN) 0.005 % ophthalmic solution Place 1 drop into both eyes at bedtime. 05/24/19   [provider]  loxapine (LOXITANE) 10 MG capsule Take 10 mg by mouth at bedtime.     [provider]  mirtazapine (REMERON) 30 MG tablet Take 1 tablet (30 mg total) by mouth at bedtime. 10/30/21   [name], MD  Multiple Vitamin (MULTIVITAMIN WITH MINERALS) TABS tablet Take 1 tablet by mouth daily. 10/31/21   [name], MD  OXYGEN Inhale 4-5 L into the lungs as directed. 4-5 liters with sleep and exertion as needed for low oxygen    [provider]  pantoprazole (PROTONIX) 40 MG tablet Take 1 tablet (40 mg total) by mouth daily. 10/30/21   [name], MD  polyethylene glycol (MIRALAX / GLYCOLAX) 17 g packet TAKE 17 GRAMS BY MOUTH DAILY Patient taking differently: Take 17 g by mouth daily. 03/09/20   [name], MD  potassium chloride (KLOR-CON M) 20 MEQ tablet Take 1 tablet (20 mEq total) by mouth daily. 04/05/22   Kathie Dike, MD  predniSONE (DELTASONE) 10 MG tablet Take 40mg  po daily for 2 days then 30mg  daily for 2 days then 20mg  daily for 2 days then 10mg  daily for 2 days then stop Patient taking differently: Take 10-40 mg by mouth See admin instructions. 40 mg x 2 days, 30 mg x 2 days, 20 mg x 2 days, 10 mg x 2 days, then stop 04/05/22   Kathie Dike, MD  senna-docusate (SENOKOT-S) 8.6-50 MG tablet Take 2 tablets by mouth at bedtime. Patient taking differently: Take 2 tablets by mouth at bedtime as needed for mild constipation. 10/30/21   [name], MD  simethicone (MYLICON) 80 MG chewable tablet Chew 1 tablet (80 mg total) by mouth 4 (four) times daily as needed for flatulence. Patient taking differently: Chew 80 mg by mouth every 6 (six) hours as needed for flatulence. 03/15/22   [name], Pratik D, DO     Allergies    Penicillins and Levocetirizine   Review of Systems   Review of Systems Please see HPI for pertinent positives and negatives  Physical Exam BP (!) 146/101   Pulse (!) 35   Temp 98.4 F (36.9 C) (Oral)   Resp 20   Ht 5\' 11"  (1.803 m)   Wt 58.9 kg   SpO2 99%   BMI 18.11 kg/m   Physical Exam Vitals and nursing note reviewed.  Constitutional:      Appearance: Normal appearance.  HENT:     Head: Normocephalic and atraumatic.     Nose: Nose normal.     Mouth/Throat:     Mouth: Mucous membranes are moist.  Eyes:     Extraocular Movements: Extraocular movements intact.     Conjunctiva/sclera: Conjunctivae normal.  Cardiovascular:     Rate and Rhythm: Normal rate.  Pulmonary:     Effort: Pulmonary effort is normal.     Breath sounds: Wheezing present.  Chest:     Chest wall: Tenderness (L lateral) present.  Abdominal:     General: Abdomen is flat.     Palpations: Abdomen is soft.     Tenderness: There is no abdominal tenderness.  Musculoskeletal:        General: No swelling. Normal range of motion.     Cervical back: Neck supple.     Right lower leg: Edema present.     Left lower leg: Edema present.  Skin:    General: Skin is warm and dry.  Neurological:     General: No focal deficit present.     Mental Status: He is alert.  Psychiatric:        Mood and Affect: Mood normal.     ED Results / Procedures / Treatments   EKG EKG Interpretation  Date/Time:  Friday April 12 2022  05:43:47 EDT Ventricular Rate:  96 PR Interval:  145 QRS Duration: 79 QT Interval:  349 QTC Calculation: 432 R Axis:   -82 Text Interpretation: Sinus rhythm Ventricular premature complex Left anterior fascicular block Anterior infarct, old Nonspecific T abnormalities, lateral leads Baseline wander No significant change since last tracing Confirmed by Calvert Cantor [phone]) on 04/12/2022 5:57:12 AM  Procedures Procedures  Medications Ordered in the ED Medications - No  data to display  Initial Impression and Plan  Patient here with chest pain, atypical for ACS, has been in the ED for same several times in the past. He denies any recent cocaine or marijuana use, continues to smoke cigarettes. Will check labs, CXR, EKG and monitor. He is NSR on monitor now.   ED Course   Clinical Course as of 04/12/22 0601  Fri Apr 12, 2022  5615 I personally viewed the images from radiology studies and agree with radiologist interpretation: CXR without acute process.   [CS]  3794 CBC with leukocytosis and anemia, similar to previous.  [CS]  3276 BMP is unremarkable. Trop is similar to priors. Will continue to monitor for repeat trop. Care will be signed out to the oncoming team at the change of shift.  [CS]    Clinical Course User Index [CS] Truddie Hidden, MD     MDM Rules/Calculators/A&P Medical Decision Making Problems Addressed: Atypical chest pain: chronic illness or injury with exacerbation, progression, or side effects of treatment  Amount and/or Complexity of Data Reviewed Labs: ordered. Radiology: ordered.    Final Clinical Impression(s) / ED Diagnoses Final diagnoses:  Atypical chest pain    Rx / DC Orders ED Discharge Orders     None        Truddie Hidden, MD 04/12/22 [phone]

## 2022-04-12 NOTE — ED Provider Notes (Addendum)
Patient signed out to me is pending second troponin which is negative.  We will recommend continued outpatient follow-up with his oncologist and outpatient physicians.  Advising immediate return for worsening symptoms or fevers or additional concerns.  Otherwise discharged home in stable condition.  Addendum: Patient called me over and states that he does not feel like he can go home due to his chronic conditions.  He was offered a nursing home last time he had been in the hospital and he would like to pursue nursing home again.  Consultation placed to transitions of care team for transitions of care, patient will need to find nursing home placement from his primary care doctor as he has left several times.  Patient expressed understanding, discharged home in stable condition.   Luna Fuse, MD 04/12/22 5170    Luna Fuse, MD 04/12/22 1000    Luna Fuse, MD 04/12/22 1053

## 2022-04-12 NOTE — ED Provider Notes (Signed)
[facility] Provider Note   CSN: [id number] Arrival date & time: 04/12/22  1623     History {Add pertinent medical, surgical, social history, OB history to HPI:1} Chief Complaint  Patient presents with   Chest Pain    Tony Sullivan is a 60 y.o. male.   Chest Pain      Home Medications Prior to Admission medications   Medication Sig Start Date End Date Taking? Authorizing Provider  acetaminophen (TYLENOL) 325 MG tablet Take 2 tablets (650 mg total) by mouth every 6 (six) hours as needed for mild pain (or Fever >/= 101). 10/30/21   [name], MD  albuterol (PROVENTIL) (2.5 MG/3ML) 0.083% nebulizer solution Take 3 mLs (2.5 mg total) by nebulization every 4 (four) hours as needed for wheezing or shortness of breath. 12/12/21 12/12/22  [name], MD  albuterol (VENTOLIN HFA) 108 (90 Base) MCG/ACT inhaler Inhale 2 puffs into the lungs every 6 (six) hours as needed for wheezing or shortness of breath. 03/15/22   [name], Pratik D, DO  apixaban (ELIQUIS) 5 MG TABS tablet Take 1 tablet (5 mg total) by mouth 2 (two) times daily. 03/22/22 05/21/22  [name], Pratik D, DO  atorvastatin (LIPITOR) 40 MG tablet TAKE ONE TABLET BY MOUTH DAILY Patient taking differently: Take 40 mg by mouth daily. 07/06/20   [name], MD  benztropine (COGENTIN) 0.5 MG tablet Take 0.5 mg by mouth at bedtime.    [provider]  Budeson-Glycopyrrol-Formoterol (BREZTRI AEROSPHERE) 160-9-4.8 MCG/ACT AERO Inhale 2 puffs into the lungs in the morning and at bedtime. 12/12/21   [name], MD  diclofenac Sodium (VOLTAREN) 1 % GEL Apply 2 g topically 4 (four) times daily as needed (pain).    [provider]  dorzolamide (TRUSOPT) 2 % ophthalmic solution Place 1 drop into the left eye 2 (two) times daily. 12/12/20   [provider]  furosemide (LASIX) 20 MG tablet Take 1 tablet (20 mg total) by mouth daily. 04/06/22   Kathie Dike, MD  gabapentin (NEURONTIN) 100 MG  capsule Take 1 capsule (100 mg total) by mouth 3 (three) times daily. 03/25/22 05/24/22  Deatra James, MD  guaiFENesin 200 MG tablet Take 2 tablets (400 mg total) by mouth every 6 (six) hours as needed for cough or to loosen phlegm. 03/02/22   Orson Eva, MD  hydrOXYzine (ATARAX) 25 MG tablet Take 1 tablet (25 mg total) by mouth 3 (three) times daily as needed for anxiety. 04/05/22   Kathie Dike, MD  latanoprost (XALATAN) 0.005 % ophthalmic solution Place 1 drop into both eyes at bedtime. 05/24/19   [provider]  loxapine (LOXITANE) 10 MG capsule Take 10 mg by mouth at bedtime.     [provider]  mirtazapine (REMERON) 30 MG tablet Take 1 tablet (30 mg total) by mouth at bedtime. 10/30/21   [name], MD  Multiple Vitamin (MULTIVITAMIN WITH MINERALS) TABS tablet Take 1 tablet by mouth daily. 10/31/21   [name], MD  OXYGEN Inhale 4-5 L into the lungs as directed. 4-5 liters with sleep and exertion as needed for low oxygen    [provider]  pantoprazole (PROTONIX) 40 MG tablet Take 1 tablet (40 mg total) by mouth daily. 10/30/21   Emokpae, Courage, MD  polyethylene glycol (MIRALAX / GLYCOLAX) 17 g packet TAKE 17 GRAMS BY MOUTH DAILY Patient taking differently: Take 17 g by mouth daily. 03/09/20   [name], MD  potassium chloride (KLOR-CON  M) 20 MEQ tablet Take 1 tablet (20 mEq total) by mouth daily. 04/05/22   Kathie Dike, MD  predniSONE (DELTASONE) 20 MG tablet Take 1 tablet (20 mg total) by mouth 2 (two) times daily with a meal for 5 days. 04/12/22 04/17/22  Luna Fuse, MD  senna-docusate (SENOKOT-S) 8.6-50 MG tablet Take 2 tablets by mouth at bedtime. Patient taking differently: Take 2 tablets by mouth at bedtime as needed for mild constipation. 10/30/21   [name], MD  simethicone (MYLICON) 80 MG chewable tablet Chew 1 tablet (80 mg total) by mouth 4 (four) times daily as needed for flatulence. Patient taking differently: Chew 80 mg by  mouth every 6 (six) hours as needed for flatulence. 03/15/22   [name], Pratik D, DO      Allergies    Penicillins and Levocetirizine    Review of Systems   Review of Systems  Cardiovascular:  Positive for chest pain.    Physical Exam Updated Vital Signs BP 118/75   Pulse 80   Temp 98.5 F (36.9 C) (Oral)   Resp 13   Ht 5\' 11"  (1.803 m)   Wt 58.9 kg   SpO2 100%   BMI 18.11 kg/m  Physical Exam  ED Results / Procedures / Treatments   Labs (all labs ordered are listed, but only abnormal results are displayed) Labs Reviewed  CBC WITH DIFFERENTIAL/PLATELET - Abnormal; Notable for the following components:      Result Value   RBC 3.52 (*)    Hemoglobin 11.1 (*)    HCT 36.1 (*)    MCV 102.6 (*)    RDW 16.0 (*)    Neutro Abs 9.1 (*)    Lymphs Abs 0.5 (*)    Abs Immature Granulocytes 0.32 (*)    All other components within normal limits  BASIC METABOLIC PANEL - Abnormal; Notable for the following components:   CO2 35 (*)    Glucose, Bld 125 (*)    BUN 21 (*)    All other components within normal limits  TROPONIN I (HIGH SENSITIVITY) - Abnormal; Notable for the following components:   Troponin I (High Sensitivity) 20 (*)    All other components within normal limits  TROPONIN I (HIGH SENSITIVITY) - Abnormal; Notable for the following components:   Troponin I (High Sensitivity) 19 (*)    All other components within normal limits    EKG None  Radiology DG Chest Port 1 View  Result Date: 04/12/2022 CLINICAL DATA:  Pain EXAM: PORTABLE CHEST 1 VIEW COMPARISON:  Same day chest x-ray FINDINGS: Stable cardiomediastinal contours. Chronic emphysematous lung changes. No new focal airspace consolidation, pleural effusion, or pneumothorax. Chronic posterior right sixth rib fracture. IMPRESSION: No active disease. Electronically Signed   By: Davina Poke D.O.   On: 04/12/2022 17:12   DG Chest Port 1 View  Result Date: 04/12/2022 CLINICAL DATA:  61 year old male with history of  shortness of breath and chest pain. EXAM: PORTABLE CHEST 1 VIEW COMPARISON:  Chest x-ray 04/06/2022. FINDINGS: Lung volumes are normal. No consolidative airspace disease. No pleural effusions. No pneumothorax. No pulmonary nodule or mass noted. Pulmonary vasculature and the cardiomediastinal silhouette are within normal limits. Atherosclerosis in the thoracic aorta. Old healed fracture of the posterolateral aspect of the right sixth rib incidentally noted. IMPRESSION: 1. No radiographic evidence of acute cardiopulmonary disease. 2. Aortic atherosclerosis. Electronically Signed   By: Vinnie Langton M.D.   On: 04/12/2022 06:14    Procedures Procedures  {Document cardiac monitor,  telemetry assessment procedure when appropriate:1}  Medications Ordered in ED Medications - No data to display  ED Course/ Medical Decision Making/ A&P                           Medical Decision Making  ***  {Document critical care time when appropriate:1} {Document review of labs and clinical decision tools ie heart score, Chads2Vasc2 etc:1}  {Document your independent review of radiology images, and any outside records:1} {Document your discussion with family members, caretakers, and with consultants:1} {Document social determinants of health affecting pt's care:1} {Document your decision making why or why not admission, treatments were needed:1} Final Clinical Impression(s) / ED Diagnoses Final diagnoses:  None    Rx / DC Orders ED Discharge Orders     None

## 2022-04-12 NOTE — ED Triage Notes (Signed)
Pt presents to  ED via [facility] from home with c/o waking up with chest pain and sob. Pt with hx of COPD and is on 5L O2 continuously.

## 2022-04-12 NOTE — Progress Notes (Signed)
Tony Sullivan  Initial Assessment   Tony Sullivan is a 61 y.o. year old male contacted by phone. Clinical Social Sullivan was referred by medical provider for assessment of psychosocial needs.   SDOH (Social Determinants of Health) assessments performed: Yes   SDOH Screenings   Alcohol Screen: Not on file  Depression (IHK7-4): Not on file  Financial Resource Strain: Not on file  Food Insecurity: Not on file  Housing: Not on file  Physical Activity: Not on file  Social Connections: Not on file  Stress: Not on file  Tobacco Use: High Risk (04/12/2022)   Patient History    Smoking Tobacco Use: Some Days    Smokeless Tobacco Use: Never    Passive Exposure: Not on file  Transportation Needs: Not on file     Distress Screen completed: No     No data to display            Family/Social Information:  Housing Arrangement: patient lives with two roommates whom pt reports both Sullivan during the day. Family members/support persons in your life? Pt has an aide 3 hrs/day to assist w/ ADLs.  Pt states his stepfather resides 6 or 7 blocks away and he has a friend residing 73 or 12 blocks away.  Both reportedly check on pt as they are able to, but pt spends a number of hours alone.  Pt has had a number of recent hospitalizations and has requested long term placement at a facility, but has then left AMA.  Pt struggling w/ losing independence and transitioning to a higher level of care. Transportation concerns: yes, pt has Medicaid transportation benefits, but has experienced some challenges getting transportation to every location he may need as well as making sure transportation is booked at least 3 days in advance.    Employment: Disabled .  Income source: Social Security Disability Financial concerns: No Type of concern: None Food access concerns: yes, pt is reliant upon family/friends to assist with shopping and ensuring there is food at home. Religious or spiritual practice:  Not known Services Currently in place:  Medicaid transport and daily aide (3hrs/day)  Coping/ Adjustment to diagnosis: Patient understands treatment plan and what happens next? yes Concerns about diagnosis and/or treatment: Quality of life Patient reported stressors: Adjusting to my illness Hopes and/or priorities: pt's priority is to begin treatment w/ the hope of a positive outcome Patient enjoys  not discussed Current coping skills/ strengths: Supportive family/friends     SUMMARY: Current SDOH Barriers:  Limited social support, Transportation, Limited access to food, and Level of care concerns  Clinical Social Sullivan Clinical Goal(s):  Patient will Sullivan with SW to address concerns related to transportation and w/ his PCP regarding possible long term placement.  Interventions: Discussed common feeling and emotions when being diagnosed with cancer, and the importance of support during treatment Informed patient of the support team roles and support services at [facility] Provided CSW contact information and encouraged patient to call with any questions or concerns Referred patient to [facility] and went over Medicaid transportation benefits to ensure pt is able to book timely transport.     Follow Up Plan: Patient will contact CSW with any support or resource needs Patient verbalizes understanding of plan: Yes    Henriette Combs, LCSW

## 2022-04-12 NOTE — ED Notes (Signed)
Patient placed on bedpan.

## 2022-04-12 NOTE — Discharge Instructions (Signed)
Call your primary care doctor today to arrange appropriate follow-up.  Return to the ER if you have worsening symptoms fevers or any additional concerns.

## 2022-04-12 NOTE — Progress Notes (Addendum)
TOC CSW received consult for placement, pt has a history of leaving facilities AMA. Pt would not be able to be placed through the ED. Pt will need to discharge back home and work with his PCP to obtain LTC placement.   Adden  12:17pm CSW spoke with pt in regard to placement, pt stated he did not like his last placement [facility]. He is requesting to be placed at another facility. CW explained to pt he would need to work with his PCP to obtain LTC placement. He reported not having an appointment. CSW will arrange PCP appointment for pt to discuss placement options. Pt home is unlock and can d/c with EMS transport back home.   Tony Sullivan.Lovis More, MSW, [facility]  Transitions of Care Clinical Social Worker I Direct Dial: [phone]  Fax: [phone] [email or web address]

## 2022-04-13 MED ORDER — ACETAMINOPHEN 325 MG PO TABS
650.0000 mg | ORAL_TABLET | Freq: Four times a day (QID) | ORAL | Status: DC | PRN
  Administered 2022-04-13 – 2022-04-14 (×2): 650 mg via ORAL
  Filled 2022-04-13 (×2): qty 2

## 2022-04-13 MED ORDER — FUROSEMIDE 40 MG PO TABS
20.0000 mg | ORAL_TABLET | Freq: Every day | ORAL | Status: DC
  Administered 2022-04-13 – 2022-04-14 (×2): 20 mg via ORAL
  Filled 2022-04-13 (×2): qty 1

## 2022-04-13 MED ORDER — GABAPENTIN 100 MG PO CAPS
100.0000 mg | ORAL_CAPSULE | Freq: Three times a day (TID) | ORAL | Status: DC
  Administered 2022-04-13 – 2022-04-14 (×3): 100 mg via ORAL
  Filled 2022-04-13 (×3): qty 1

## 2022-04-13 MED ORDER — ALBUTEROL SULFATE (2.5 MG/3ML) 0.083% IN NEBU
2.5000 mg | INHALATION_SOLUTION | RESPIRATORY_TRACT | Status: DC | PRN
  Administered 2022-04-13: 2.5 mg via RESPIRATORY_TRACT
  Filled 2022-04-13: qty 3

## 2022-04-13 MED ORDER — BUDESON-GLYCOPYRROL-FORMOTEROL 160-9-4.8 MCG/ACT IN AERO: 2.0000 | INHALATION_SPRAY | Freq: Every day | RESPIRATORY_TRACT | Status: DC

## 2022-04-13 MED ORDER — ATORVASTATIN CALCIUM 40 MG PO TABS
40.0000 mg | ORAL_TABLET | Freq: Every day | ORAL | Status: DC
Start: 2022-04-13 — End: 2022-04-14
  Administered 2022-04-13 – 2022-04-14 (×2): 40 mg via ORAL
  Filled 2022-04-13 (×2): qty 1

## 2022-04-13 MED ORDER — POTASSIUM CHLORIDE CRYS ER 20 MEQ PO TBCR
20.0000 meq | EXTENDED_RELEASE_TABLET | Freq: Every day | ORAL | Status: DC
  Administered 2022-04-13 – 2022-04-14 (×2): 20 meq via ORAL
  Filled 2022-04-13 (×2): qty 1

## 2022-04-13 MED ORDER — MOMETASONE FURO-FORMOTEROL FUM 100-5 MCG/ACT IN AERO
2.0000 | INHALATION_SPRAY | Freq: Two times a day (BID) | RESPIRATORY_TRACT | Status: DC
  Administered 2022-04-13 – 2022-04-14 (×3): 2 via RESPIRATORY_TRACT
  Filled 2022-04-13: qty 8.8

## 2022-04-13 MED ORDER — APIXABAN 5 MG PO TABS
5.0000 mg | ORAL_TABLET | Freq: Two times a day (BID) | ORAL | Status: DC
Start: 2022-04-13 — End: 2022-04-14
  Administered 2022-04-13 – 2022-04-14 (×4): 5 mg via ORAL
  Filled 2022-04-13 (×4): qty 1

## 2022-04-13 MED ORDER — HYDROXYZINE HCL 25 MG PO TABS
25.0000 mg | ORAL_TABLET | Freq: Three times a day (TID) | ORAL | Status: DC | PRN
  Administered 2022-04-13 (×3): 25 mg via ORAL
  Filled 2022-04-13 (×3): qty 1

## 2022-04-13 MED ORDER — UMECLIDINIUM BROMIDE 62.5 MCG/ACT IN AEPB
1.0000 | INHALATION_SPRAY | Freq: Every day | RESPIRATORY_TRACT | Status: DC
  Administered 2022-04-13 – 2022-04-14 (×2): 1 via RESPIRATORY_TRACT
  Filled 2022-04-13: qty 7

## 2022-04-13 NOTE — Evaluation (Signed)
Physical Therapy Evaluation Patient Details Name: Tony Sullivan MRN: [id number] DOB: [birth year] Today's Date: 04/13/2022  History of Present Illness  Tony Sullivan is a 61 y.o. 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 Breathing is about at baseline. No recent cough or fever.   Clinical Impression  Patient demonstrates slow labored movement for sitting up at bedside requiring HOB raised and frequent rest breaks due to SOB and generalized weakness.  Patient required Mod/max assist to complete stand pivot transfer with knees blocked due to BLE weakness and declined to use RW due to fear of falling.  Patient tolerated sitting up in chair after therapy - RN notified.  Patient will benefit from continued skilled physical therapy in hospital and recommended venue below to increase strength, balance, endurance for safe ADLs and gait.          Recommendations for follow up therapy are one component of a multi-disciplinary discharge planning process, led by the attending physician.  Recommendations may be updated based on patient status, additional functional criteria and insurance authorization.  Follow Up Recommendations Skilled nursing-short term rehab (<3 hours/day)    Assistance Recommended at Discharge Intermittent Supervision/Assistance  Patient can return home with the following  A lot of help with walking and/or transfers;A lot of help with bathing/dressing/bathroom;Assistance with cooking/housework;Help with stairs or ramp for entrance;Assist for transportation    Equipment Recommendations    Recommendations for Other Services       Functional Status Assessment Patient has had a recent decline in their functional status and demonstrates the ability to make significant  improvements in function in a reasonable and predictable amount of time.     Precautions / Restrictions Precautions Precautions: Fall Restrictions Weight Bearing Restrictions: No      Mobility  Bed Mobility Overal bed mobility: Needs Assistance Bed Mobility: Supine to Sit     Supine to sit: Mod assist     General bed mobility comments: slow labored movement, increased time with frequent rest breaks due to difficulty breathing, weakness    Transfers Overall transfer level: Needs assistance Equipment used: 1 person hand held assist Transfers: Sit to/from Stand, Bed to chair/wheelchair/BSC Sit to Stand: Mod assist Stand pivot transfers: Mod assist, Max assist         General transfer comment: labored movement with limited standing due to BLE weakness and SOB    Ambulation/Gait                  Stairs            Wheelchair Mobility    Modified Rankin (Stroke Patients Only)       Balance Overall balance assessment: Needs assistance Sitting-balance support: Feet supported, Bilateral upper extremity supported Sitting balance-Leahy Scale: Poor Sitting balance - Comments: fair/poor seated at EOB   Standing balance support: During functional activity, No upper extremity supported Standing balance-Leahy Scale: Poor Standing balance comment: Mod/max hand held assist                             Pertinent Vitals/Pain Pain Assessment Pain Assessment: Faces Faces Pain Scale: Hurts little more Pain Location: discomfort in chest during exertion Pain Descriptors / Indicators: Pressure, Sore Pain Intervention(s): Limited activity within patient's  tolerance, Monitored during session, Repositioned    Home Living Family/patient expects to be discharged to:: Private residence Living Arrangements: Non-relatives/Friends Available Help at Discharge: Personal care attendant;Friend(s) Type of Home: House Home Access: [place]: Able to live on main level with bedroom/bathroom Home Equipment: [place] (2 wheels);Cane - single point;Wheelchair - power;BSC/3in1;Wheelchair - manual Additional Comments: aide available 3hr/day 7 days/week; has 2 roomates    Prior Function Prior Level of Function : Needs assist       Physical Assist : Mobility (physical);ADLs (physical) Mobility (physical): Bed mobility;Transfers;Gait;Stairs   Mobility Comments: Mod Indep transfers to wheelchair, does not ambulate ADLs Comments: assisted by home aides     Hand Dominance   Dominant Hand: Right    Extremity/Trunk Assessment   Upper Extremity Assessment Upper Extremity Assessment: Generalized weakness    Lower Extremity Assessment Lower Extremity Assessment: Generalized weakness    Cervical / Trunk Assessment Cervical / Trunk Assessment: Normal  Communication   Communication: No difficulties  Cognition Arousal/Alertness: Awake/alert Behavior During Therapy: WFL for tasks assessed/performed Overall Cognitive Status: Within Functional Limits for tasks assessed                                          General Comments      Exercises     Assessment/Plan    PT Assessment Patient needs continued PT services  PT Problem List Decreased strength;Decreased activity tolerance;Decreased balance;Decreased mobility;Cardiopulmonary status limiting activity       PT Treatment Interventions DME instruction;Functional mobility training;Therapeutic activities;Therapeutic exercise;Balance training;Patient/family education;Wheelchair mobility training    PT Goals (Current goals can be found in the Care Plan section)  Acute Rehab PT Goals Patient Stated Goal: return home after rehab PT Goal Formulation: With patient Time For Goal Achievement: 04/27/22 Potential to Achieve Goals: Good    Frequency Min 2X/week     Co-evaluation               AM-PAC PT "6 Clicks" Mobility  Outcome  Measure Help needed turning from your back to your side while in a flat bed without using bedrails?: A Lot Help needed moving from lying on your back to sitting on the side of a flat bed without using bedrails?: A Lot Help needed moving to and from a bed to a chair (including a wheelchair)?: A Lot Help needed standing up from a chair using your arms (e.g., wheelchair or bedside chair)?: A Lot Help needed to walk in hospital room?: Total Help needed climbing 3-5 steps with a railing? : Total 6 Click Score: 10    End of Session Equipment Utilized During Treatment: Oxygen Activity Tolerance: Patient tolerated treatment well;Patient limited by fatigue Patient left: in chair;with call bell/phone within reach Nurse Communication: Mobility status PT Visit Diagnosis: Unsteadiness on feet (R26.81);Other abnormalities of gait and mobility (R26.89);Muscle weakness (generalized) (M62.81)    Time: 4166-0630 PT Time Calculation (min) (ACUTE ONLY): 37 min   Charges:   PT Evaluation $PT Eval High Complexity: 1 High PT Treatments $Therapeutic Activity: 23-37 mins        10:23 AM, 04/13/22 Lonell Grandchild, MPT Physical Therapist with [facility] [phone] office [phone] mobile phone

## 2022-04-13 NOTE — Plan of Care (Signed)
  Problem: Acute Rehab PT Goals(only PT should resolve) Goal: Pt Will Go Supine/Side To Sit Outcome: Progressing Flowsheets (Taken 04/13/2022 1024) Pt will go Supine/Side to Sit:  with min guard assist  with minimal assist Goal: Patient Will Transfer Sit To/From Stand Outcome: Progressing Flowsheets (Taken 04/13/2022 1024) Patient will transfer sit to/from stand: with minimal assist Goal: Pt Will Transfer Bed To Chair/Chair To Bed Outcome: Progressing Flowsheets (Taken 04/13/2022 1024) Pt will Transfer Bed to Chair/Chair to Bed: with min assist Goal: Pt Will Ambulate Outcome: Progressing Flowsheets (Taken 04/13/2022 1024) Pt will Ambulate:  10 feet  with moderate assist  with rolling walker   10:25 AM, 04/13/22 Lonell Grandchild, MPT Physical Therapist with [facility] [phone] office [phone] mobile phone

## 2022-04-13 NOTE — ED Notes (Signed)
Pt requesting sacral dressing be placed over sacrum.

## 2022-04-13 NOTE — ED Notes (Signed)
Lunch tray provided. 

## 2022-04-13 NOTE — ED Notes (Signed)
Pt given graham crackers and peanut butter- pt given frozen meatloaf dinner tray and water.

## 2022-04-13 NOTE — ED Notes (Signed)
Pt requested to be moved from the recliner into the bed. Incontinent of stool and urine. Pt assisted to the bed with 2 person assist. Allowed time to rest to improved exertional SOB. Pt cleaned up, new brief and Primofit placed. Pt has water and snacks at the bedside. Call bell within reach

## 2022-04-14 NOTE — Progress Notes (Signed)
CSW spoke with pt and informed him once again he would need to follow up with his PCP for placement. CSW informed pt CSW will make him an appointment when his PCP office opens back up on Monday. This was told to pt on Friday prior to his d/c. Pt told EDP he does not have help at home however pt does receive PCS and is being followed by [facility] for Palliative services. Pt was informed he would need to call his CAP service program to get more aide hours if needed on the last admission. Pt stated he spoke with someone a while ago and they told him it will take some time. Pt provided CSW with a contact number for this writer to assist pt with following up about his CAP services. Pt is not in need of DME, as pt has home O2 and a wheelchair at home.   CSW received permission to contact pt's daughter. CSW attempted to contact pt's daughter [name] to update her on plan, no response, unable to leave VM.   [facility] was called to take pt home upon d/c. CSW received a call from [facility] stating they are 20 mins out from picking pt up. [facility] will follow up with pt's RN when they arrive.   Arlie Solomons.Earlin Sweeden, MSW, [facility]  Transitions of Care Clinical Social Worker I Direct Dial: [phone]  Fax: [phone] [email or web address]

## 2022-04-14 NOTE — ED Notes (Signed)
Social Worker at bedside discussing the plan with patient. Tony Sullivan

## 2022-04-14 NOTE — ED Provider Notes (Addendum)
Emergency Medicine Observation Re-evaluation Note  Tony Sullivan is a 61 y.o. male, seen on rounds today.  Pt initially presented to the ED for complaints of Chest Pain Currently, the patient is asleep.  Physical Exam  BP (!) 127/91   Pulse 89   Temp 98.1 F (36.7 C)   Resp 19   Ht 5\' 11"  (1.803 m)   Wt 58.9 kg   SpO2 94%   BMI 18.11 kg/m  Physical Exam General: asleep Cardiac: rrr Lungs: cta b Psych: calm  ED Course / MDM  EKG:   I have reviewed the labs performed to date as well as medications administered while in observation.  Recent changes in the last 24 hours include PT eval which recommends continued skilled care.  Plan  Current plan is for Tony Sullivan to find SNF placement.  Tony Sullivan is not under involuntary commitment.  SW said pt can go home.  He has oxygen and w/c at home.  Pt is to f/u with pcp.     [name], MD 04/14/22 3437    [name], MD 04/14/22 1304

## 2022-04-14 NOTE — ED Notes (Addendum)
Per [name] LCSW " I spoke with this pt on Friday and informed him he would have to follow up with his PCP for placement. I told him he had to wait until Monday for an appointment to be made as his PCP office was closed Friday . pt has a roommate and has aides coming  in the home. he will need to work with his PCP for LTC placement". This RN spoke with patient who reports that he has homehealth comes in 3 hours per day. He reports he does want to go inpatient for rehab but does not want [facility]. Pt called PCP today and left a voicemail. [name]

## 2022-04-16 ENCOUNTER — Ambulatory Visit (HOSPITAL_COMMUNITY): Payer: Medicaid Other | Admitting: Hematology

## 2022-04-16 ENCOUNTER — Other Ambulatory Visit: Payer: Medicaid Other

## 2022-04-16 VITALS — BP 140/80 | Temp 97.5°F

## 2022-04-16 DIAGNOSIS — Z515 Encounter for palliative care: Secondary | ICD-10-CM

## 2022-04-16 NOTE — Progress Notes (Signed)
COMMUNITY PALLIATIVE CARE SW NOTE  PATIENT NAME: Tony Sullivan DOB: 04/13/1961 MRN: [id number]  PRIMARY CARE PROVIDER: [name], FNP  RESPONSIBLE PARTY:  Acct ID - Guarantor Home Phone Work Phone Relationship Acct Type  [id number] [name], [name] [phone]  Self P/F     [place], [place], [place] [address]     PLAN OF CARE and INTERVENTIONS:         GOALS OF CARE/ ADVANCE CARE PLANNING: Goals include to maximize quality of life and symptom management. Our advance care planning conversation included a discussion about:    The value and importance of advance care planning  Review and updating or creation of an advance directive document.  Code status: Full code. Patient desires full scope of treatment.    2.        Palliative care encounter: Palliative medicine team will continue to support patient, patient's family, and medical team. Visit consisted of counseling and education dealing with the complex and emotionally intense issues of symptom management and palliative care in the setting of serious and potentially life-threatening illness.  SW completed initial PC visit with PC RN J. Owens Shark, met with patient.   Functional changes/updates: Patient has had a number of hospital visits and stays due to COPD exacerbation and chest pains. Patient was inpatient STR at [facility], from hospital 6/5-6/9, for one day on 6/9 and requested to return back to hospital 6/10 due to shortness of breath, while in the ER patient refused to return back to [facility] SNF due to insufficient care and was dc'd to home. Recent hospitalization on 6/16-6/18/23, placement discussed and possible bed offer from [facility] was made, however patient was dc'd back to home.  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 Patient also to be followed by oncology for mass on lungs, of which he desires to pursue treatment if an option.   ADL: Patient requires assistance with toileting and  bathing and housekeeping. Patient is mobile via a electric WC.   Home & Environment assessment: Patient's home is one level. Patient feels safe in his home environment, however states that his roommate is a drug addict and attracts the wrong crowd to his home. Patients home is older and seemingly unkept. Patients hospital bed is stationed in the living room with a mini refrigerator. Patients BSC and O2 concentrator is located in a front bedroom.   DME: Patient has hospital bed, BSC, electric WC and ramp. Patient inquired about lift chair and bed rails. SW outreached [employer] bed rails as patient received bed from [facility] about 2-3 months ago. [facility] about lift and was told that Medicaid covers 80% of cost and patient is responsible for remainder of 20% or covered by Medicare.  Protein calorie malnutrition/weight loss/Appetite: Patient's appetite is good, however weight loss is noted.   Psychosocial assessment: completed.   In home support: Patient receives PCS 7 days a week, 3 hours a day through [facility], caregiver is [name] [phone]. Patient is at his max PCS hours. Would like to receive CAP services, due to requesting more assistance in the home. SW outreached Loma Sousa [phone] of [place] [employer] [facility] [employer], whom shared that she has completed the CAP referral on patient behalf and is waiting on feedback.  [facility] health referral made as well as it is difficult or patient to get out of the home. Patient would also like to receive [facility]. Patient has small wound to  back side and requires [facility] RN to address.  Long term planning: Patient declines placement at this time. Patient would like to remain at home as long as possible without placement.   Food: food insecurities. Receives delivered meals from [employer]. Patient is not eligible for MOWS due to age. Patient ineligible for foodstamps due to violating one of the eligibility  rules.   Transportation: utilizes local/public transit.  Medication: has copays that he cant afford sometimes. Encouraged patient to enroll Medicare by calling 1-800 Medicare.  Ongoing support/resources will continued to be offered if needed.   Military hx: none  Medical assessment: RN reviewed medications and took vitals.    Hospice discussion: N/A  SW discussed goals, reviewed care plan, provided emotional support, used active and reflective listening in the form of reciprocity emotional response. Patient wishes to keep patient in the home for as long as possible. Questions and concerns were addressed. The patient/family was encouraged to call with any additional questions and/or concerns. PC Provided general support and encouragement, no other unmet needs identified. Will continue to follow.  3.         PATIENT/CAREGIVER EDUCATION/ COPING:   Appearance: well groomed, appropriate given situation  Mental Status: alert and oriented  Eye Contact: good Thought Process: rational  Thought Content: not assessed  Speech: Normal rate, volume, tone  Mood: Normal and calm Affect: Congruent to endorsed mood, full ranging Insight: good Judgement: good Interaction Style: Cooperative  Patient A&O and able to make needs known. Patient actively engaged in conversation. Patient shared his drug and alcohol hx and shared that he has been cleared years but still smokes cigarettes occasionally. Patient shared that he experiences some anxiety due to shortness of breath episode that subsides after taking a trazadone.   Family support: Patient shares that his step dad is his next of kin that he wishes to be able to make decisions for him. Patient shared that he has a niece that lives in [place] and had considered moving with her some time ago. Patient does not have any other close relatives or family  4.         PERSONAL EMERGENCY PLAN:  Patient will call 9-1-1 for emergencies.    5.          COMMUNITY RESOURCES COORDINATION/ HEALTH CARE NAVIGATION:  Patient and sisters manages his care.  6.           FINANCIAL CONCERNS/NEEDS: None.                        Primary Health Insurance: Medicaid  Secondary Health Insurance: none Prescription Coverage: Yes, no history of difficulty obtaining or affording prescriptions reported  SOCIAL HX:  Social History   Tobacco Use   Smoking status: Some Days    Packs/day: 0.25    Years: 36.00    Total pack years: 9.00    Types: Cigarettes    Last attempt to quit: 10/2021    Years since quitting: 0.4   Smokeless tobacco: Never  Substance Use Topics   Alcohol use: Not Currently    Alcohol/week: 14.0 - 21.0 standard drinks of alcohol    Types: 14 - 21 Standard drinks or equivalent per week    Comment: 2-3 beers/day, with occasional "binges".  No history of withdrawal    CODE STATUS: FULL CODE  ADVANCED DIRECTIVES: N MOST FORM COMPLETE:  N HOSPICE EDUCATION PROVIDED: N  PPS: Patient is MODA with all ADL's at this  time. Patient is A&O  with good insight and judgement.       Doreene Eland, [place]

## 2022-04-16 NOTE — Progress Notes (Signed)
PATIENT NAME: Tony Sullivan DOB: [birth year] MRN: [id number]  PRIMARY CARE PROVIDER: [name], FNP  RESPONSIBLE PARTY:  Acct ID - Guarantor Home Phone Work Phone Relationship Acct Type  [id number] RAYE, [phone]  Self P/F     [place], [place], [place] [address]   Joint visit with [place], SW.   ACP:  Patient desires full code and full medical interventions.  He would like to remain home and have additional assistance in the home.  SW assisting with additional resources.   COPD: Patient is on continuous O2 currently at 5 L via [place].  Patient with dyspnea while talking and worsens with exertion.    Using nebulizer 0-4 x daily as needed.  2 hospitalizations this month and 3 ED visits this month.    Patient advised he learned more about medication management and also transfers on his last hospitalization.  He felt this would be helpful with his breathing as his desire is to remain home.   Squamous Cell Lung Cancer:  Patient advised he is seeking treatment if this is an option.  Patient advised he would need to have a scan completed to check for metastasis of the cancer before treatment options are discussed.  He is planning on following up with the cancer center.   Medication Management:  Patient has a bag with bottles of his routine medications.  We discussed use of a pill box but patient has declined stating it was easier for him to continue as he is doing.   Resources:  Discussed [facility] with patient as he is homebound and requires transportation when he is able to get to appointments. Patient is interested in more information and agreed to have [facility] contact him with additional information.  Demographic sheet faxed to agency to contact patient for possible new patient.    Weight loss: 28 lb weight loss noted in 3 months.  Patient endorses eating 2 meals but small portions.   04/01/22: weight 129.8 lbs 02/27/22: weight 140.6  lbs 01/07/22: weight  158.8 lbs Ht 5'11 BMI: 18.21 Albumin 2.8  Protein 5.8  HISTORY OF PRESENT ILLNESS:  61 year old male with COPD, Essential HTN, Squamous Cell Lung Ca.    CODE STATUS: Full ADVANCED DIRECTIVES: No MOST FORM: No PPS: 30%   PHYSICAL EXAM:   VITALS: Today's Vitals   04/16/22 1311  BP: 140/80  Temp: (!) 97.5 F (36.4 C)  SpO2: 92%  PainSc: 0-No pain    LUNGS: scattered rhonchi bilaterally CARDIAC: Cor RRR}  EXTREMITIES: 1+ pitting edema SKIN: Skin color, texture, turgor normal. No rashes or lesions or patient advised he has a small wound to his buttock.  Applying ointment to the area daily.    NEURO: positive for gait problems and weakness       Lorenza Burton, RN

## 2022-04-17 ENCOUNTER — Ambulatory Visit: Payer: Medicaid Other | Admitting: Internal Medicine

## 2022-04-17 NOTE — Progress Notes (Deleted)
Subjective:   Patient ID: Tony Sullivan, male    DOB: 31-Jul-1961  MRN: [id number]    Brief patient profile:  60 yobm quit smoking 3/23 with breathing difficulties since 2004 dx as copd on advair 250 /50 but getting worse since 2014 and placed on 02 for the first time in March 2015 and referred 01/07/2014 to pulmonary clinic by Dr Kennon Holter with GOLD II copd criteria established 02/14/14    History of Present Illness  01/07/2014 1st [facility] visit/ Tony Sullivan  Chief Complaint  Patient presents with   Advice Only    Referred for COPD, SOB.  Pt c/o SOB, low 02, prod cough with white mucous.  Pt forgot med list, unsure of what all he is taking  still can do a [employer] but uses [facility] parking, assoc with congested coughing esp in am worse in winter. rec Stop advair   Plan A =  Automatic =  symbicort 160 Take 2 puffs first thing in am and then another 2 puffs about 12 hours later. spiriva in am only  Plan B = Backup- Proventil use it only if you can't your breath - ok to use up to every 4 hours if needed Plan C = Nebulizer up to every 4 hours if needed only if you try plan B first and it doesn't wor      12/21/2018  f/u ov/Tony Sullivan re:  GOLD III copd/ pred 30 mg daily with floor 10 mg on symb 160/spiriva  Chief Complaint  Patient presents with   Follow-up    Pt c/o sinus pressure off and on for the past wk. He is having some increased SOB this morning.    Dyspnea:  Can do shopping at grocery on 2lpm does not check sats  Cough: usual cough = rattling  Sleeping: bed is flat / pillows up to 30 degrees  SABA use: up to q 6 hours on neb  02: 2lpm  rec Plan A = Automatic = symbicort 160 Take 2 puffs first thing in am and then another 2 puffs about 12 hours later/spiriva 2.5  X 3 puffs each am  Work on inhaler technique Plan B = Backup Only use your albuterol inhaler as a rescue medication  Plan C = Crisis - only use your albuterol nebulizer if you first try Plan B and it fails to  help > ok to use the nebulizer up to every 4 hours but if start needing it regularly call for immediate appointment Plan D = Deltasone  Take 2 daily until better then 1 daily     Admit date: 01/07/2022 Discharge date: 01/09/2022     Brief/Interim Summary: Per HPI: Tony Sullivan is a 61 y.o. male with medical history significant of   COPD on 4 LPM of home oxygen, GERD, hypertension, anxiety, osteoarthritis, tobacco and cocaine use and history of cervical fusion who presents to the emergency department due to worsening shortness of breath which has been going on for over a week.  Patient was seen in the ED on 3/2, was provided with breathing treatment and Solu-Medrol and was discharged home with doxycycline and Norco.  He complained of increasing work of breathing despite using home breathing treatments and also complained of an aching pain in neck and back today, so he activated EMS who noted him to have tachycardia and low-grade fever> LUL as dz c/w CAP   01/07/22: Patient was admitted with community-acquired pneumonia and started on IV Levaquin.  He  continues to have some mild nausea as well as ongoing cough.  Plan will be to continue IV Levaquin at this time and monitor for symptomatic improvement prior to considering discharge. Repeat AM labs. Wears 3-4 L [place] at home.   01/08/22: Patient has some ongoing weakness and poor appetite.  Plan to continue current treatments.    01/09/2022: Patient is feeling better today and closer to his baseline level of functioning.  He is in stable condition for discharge to continue on oral antibiotics for 3 more days to complete total 5-day course of treatment.  He is needing some assistance financially to afford his medications and this will be worked on by [employer].  He will need to follow-up with pulmonology as well as PCP in the outpatient setting.   Discharge Diagnoses:    CAP (community acquired pneumonia)   COPD (chronic obstructive pulmonary disease) ([place])    Leukocytosis   Prolonged QT interval   Smoker   Essential hypertension   GERD (gastroesophageal reflux disease)   Mixed hyperlipidemia   Hypokalemia   Hypoalbuminemia due to protein-calorie malnutrition ([place])   Glaucoma        01/18/2022  f/u ov/Tony Sullivan/Tony Sullivan re: GOLD 3/ 02 dep maint on breztri and pred 20 mg daily   Chief Complaint  Patient presents with   Hospitalization Follow-up    Hosp from 3/13-3/15 for pneumonia.  Patient still feeling like his chest is tight   Dyspnea:  10 ft / uses motorized w/c  Cough: smoker's rattle esp in am  / last cig Dec 16 2021 / not using flutter valve much  Sleeping: ok  at 74 hosp bed  SABA use: more hfa  02: 3 pulsed out/  3-4 lpm at home  Covid status: vax x 4  Rec No change rx  Admit x 2 with  hypercarbic RF  Admit date: 04/01/2022 Discharge date: 04/05/2022   Admitted From: Home Disposition: Skilled nursing facility   Recommendations for Outpatient Follow-up:  Follow up with PCP in 1-2 weeks Please obtain BMP/CBC in one week Follow-up with oncology for further work-up of lung cancer Continue BiPAP nightly     Discharge Condition: Stable CODE STATUS: Full code Diet recommendation: Heart healthy   Brief/Interim Summary: 61 y.o. male with past medical history relevant for chronic hypoxic and hypercapnic respiratory failure in the setting of underlying COPD and ongoing tobacco abuse, GERD, HTN, HLD who is on chronic anticoagulation who was discharged on 03/28/2022, readmitted on 04/02/2019 with COPD flareup     Discharge Diagnoses:  Principal Problem:   Acute exacerbation of chronic obstructive pulmonary disease (COPD) ([employer]) Active Problems:   Squamous cell lung cancer   H/o Pulmonary embolism ([place])   COPD with acute exacerbation ([place])   Essential hypertension   Cigarette smoker   1) acute COPD exacerbation--required BiPAP -Continue BiPAP nightly -He is completed a course of doxycycline -He was treated with  Solu-Medrol, subsequently transition to prednisone taper -Continue on bronchodilators -Cough, dyspnea and work of breathing continues to improve   2) acute on chronic hypoxic and hypercapnic respiratory failure -Secondary to #1 above -Manage as above #1 -Patient is in the process of getting trilogy/NIV machine which he can get once he gets out of SNF -Okay to discharge to SNF/LTC if facility has a BiPAP machine   3) social/ethics--recurrent admissions to the hospital, multiple comorbid conditions palliative care consult appreciated  -patient remains full code without limitations to treatment   4) squamous cell lung cancer--- diagnosed by  bronchoscopy on 03/22/2022 after presenting with hemoptysis and endobronchial mass, outpatient PET scan pending -He will need to follow-up with Dr. 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   Dyspnea:  *** Cough: *** Sleeping: *** SABA use: *** 02: *** Covid status: *** Lung cancer screening: ***   No obvious day to day or daytime variability or assoc excess/ purulent sputum or mucus plugs or hemoptysis or cp or chest tightness, subjective wheeze or overt sinus or hb symptoms.   *** without nocturnal  or early am exacerbation  of respiratory  c/o's or need for noct saba. Also denies any obvious fluctuation of symptoms with weather or environmental changes or other aggravating or alleviating factors except as outlined above   No unusual exposure hx or h/o childhood pna/ asthma or knowledge of premature birth.  Current  Allergies, Complete Past Medical History, Past Surgical History, Family History, and Social History were reviewed in [employer] record.  ROS  The following are not active complaints unless bolded Hoarseness, sore throat, dysphagia, dental problems, itching, sneezing,  nasal congestion or discharge of excess mucus or purulent secretions, ear ache,   fever, chills, sweats, unintended wt loss or wt gain, classically pleuritic or exertional cp,  orthopnea pnd or arm/hand swelling  or leg swelling, presyncope, palpitations, abdominal pain, anorexia, nausea, vomiting, diarrhea  or change in bowel habits or change in bladder habits, change in stools or change in urine, dysuria, hematuria,  rash, arthralgias, visual complaints, headache, numbness, weakness or ataxia or problems with walking or coordination,  change in mood or  memory.        No outpatient medications have been marked as taking for the 04/17/22 encounter (Appointment) with [name], MD.                  Objective:   Physical Exam  Wts  04/17/2022   ***  01/18/2022   143  12/12/2021   152 11/06/2020   159 03/20/2020   162 05/13/2019   159  12/21/2018    163      06/15/2014  164  >  09/15/2014 158 >   02/20/2015 >156 03/22/2015 >  07/31/2015   150 > 12/05/2015  161   > 04/01/2016    151 > 07/29/2016  152 >   02/25/2017  153 >  06/05/2017  153 > 11/03/2017   166 > 01/30/2018  167> 03/16/2018  168 > 06/10/2018  167 > 07/27/2018  169 > 09/18/2018 163 >      Vital signs reviewed  04/17/2022  - Note at rest 02 sats  ***% on ***   General appearance:    ***    Mod bar ***               I personally reviewed images and agree with radiology impression as follows:  CXR:   pa and lateral   01/13/22   Improving left upper lobe pneumonia.

## 2022-04-22 ENCOUNTER — Encounter (HOSPITAL_COMMUNITY): Payer: Self-pay

## 2022-04-22 ENCOUNTER — Emergency Department (HOSPITAL_COMMUNITY): Payer: Medicaid Other

## 2022-04-22 ENCOUNTER — Inpatient Hospital Stay (HOSPITAL_COMMUNITY): Payer: Medicaid Other | Admitting: Hematology

## 2022-04-22 ENCOUNTER — Other Ambulatory Visit: Payer: Self-pay

## 2022-04-22 ENCOUNTER — Emergency Department (HOSPITAL_COMMUNITY)
Admission: EM | Admit: 2022-04-22 | Discharge: 2022-04-23 | Disposition: A | Discharge status: Home / Self Care | Payer: Medicaid Other | Attending: Emergency Medicine | Admitting: Emergency Medicine

## 2022-04-22 DIAGNOSIS — J449 Chronic obstructive pulmonary disease, unspecified: Secondary | ICD-10-CM | POA: Insufficient documentation

## 2022-04-22 DIAGNOSIS — I10 Essential (primary) hypertension: Secondary | ICD-10-CM | POA: Insufficient documentation

## 2022-04-22 DIAGNOSIS — Z7901 Long term (current) use of anticoagulants: Secondary | ICD-10-CM | POA: Insufficient documentation

## 2022-04-22 DIAGNOSIS — R059 Cough, unspecified: Secondary | ICD-10-CM | POA: Diagnosis not present

## 2022-04-22 DIAGNOSIS — Z7951 Long term (current) use of inhaled steroids: Secondary | ICD-10-CM | POA: Diagnosis not present

## 2022-04-22 DIAGNOSIS — R0602 Shortness of breath: Secondary | ICD-10-CM | POA: Insufficient documentation

## 2022-04-22 LAB — BASIC METABOLIC PANEL
Anion gap: 8 (ref 5–15)
BUN: 16 mg/dL (ref 6–20)
CO2: 32 mmol/L (ref 22–32)
Calcium: 8.6 mg/dL — ABNORMAL LOW (ref 8.9–10.3)
Chloride: 99 mmol/L (ref 98–111)
Creatinine, Ser: 0.7 mg/dL (ref 0.61–1.24)
GFR, Estimated: >60 mL/min (ref >60)
Glucose, Bld: 83 mg/dL (ref 70–99)
Potassium: 3.6 mmol/L (ref 3.5–5.1)
Sodium: 139 mmol/L (ref 135–145)

## 2022-04-22 LAB — CBC
HCT: 35.9 % — ABNORMAL LOW (ref 39.0–52.0)
Hemoglobin: 11.1 g/dL — ABNORMAL LOW (ref 13.0–17.0)
MCH: 31.6 pg (ref 26.0–34.0)
MCHC: 30.9 g/dL (ref 30.0–36.0)
MCV: 102.3 fL — ABNORMAL HIGH (ref 80.0–100.0)
Platelets: 228 10*3/uL (ref 150–400)
RBC: 3.51 MIL/uL — ABNORMAL LOW (ref 4.22–5.81)
RDW: 15.9 % — ABNORMAL HIGH (ref 11.5–15.5)
WBC: 10.3 10*3/uL (ref 4.0–10.5)
nRBC: 0.5 % — ABNORMAL HIGH (ref 0.0–0.2)

## 2022-04-22 LAB — TROPONIN I (HIGH SENSITIVITY): Troponin I (High Sensitivity): 11 ng/L (ref <18)

## 2022-04-22 MED ORDER — HYDROXYZINE HCL 25 MG PO TABS
25.0000 mg | ORAL_TABLET | Freq: Once | ORAL | Status: AC
  Administered 2022-04-22: 25 mg via ORAL
  Filled 2022-04-22: qty 1

## 2022-04-22 MED ORDER — APIXABAN 5 MG PO TABS
5.0000 mg | ORAL_TABLET | Freq: Once | ORAL | Status: AC
  Administered 2022-04-22: 5 mg via ORAL
  Filled 2022-04-22: qty 1

## 2022-04-22 MED ORDER — ALBUTEROL SULFATE (2.5 MG/3ML) 0.083% IN NEBU
2.5000 mg | INHALATION_SOLUTION | Freq: Once | RESPIRATORY_TRACT | Status: AC
  Administered 2022-04-22: 2.5 mg via RESPIRATORY_TRACT
  Filled 2022-04-22: qty 3

## 2022-04-22 NOTE — ED Notes (Signed)
Cleaned pt up, pt did not want me to put a brief on him due to him being out of breathe he said. Pt stated he wanted a urinal so urinal is at bedside.  Sheet is over top the pt.
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
## 2022-04-24 NOTE — Telephone Encounter (Signed)
Pt has been in and out of the hospital a lot recently and has been on multiple rounds of steroids. Called and spoke with pt to schedule him a follow up with either Dr. [name] or APP and pt said he would have to call the office back to make the appt as he is going to be going to cancer center and needed to find out when his appts with them will be. Pt said he would call office back. Nothing further needed.

## 2022-04-25 LAB — FUNGUS CULTURE WITH STAIN

## 2022-04-25 LAB — FUNGAL ORGANISM REFLEX

## 2022-04-25 LAB — FUNGUS CULTURE RESULT

## 2022-04-29 ENCOUNTER — Emergency Department (HOSPITAL_COMMUNITY): Payer: Medicaid Other

## 2022-04-29 ENCOUNTER — Other Ambulatory Visit: Payer: Self-pay

## 2022-04-29 ENCOUNTER — Inpatient Hospital Stay (HOSPITAL_COMMUNITY)
Admission: EM | Admit: 2022-04-29 | Discharge: 2022-05-03 | DRG: 190 | Disposition: A | Discharge status: Home / Self Care | Payer: Medicaid Other | Attending: Internal Medicine | Admitting: Internal Medicine

## 2022-04-29 ENCOUNTER — Encounter (HOSPITAL_COMMUNITY): Payer: Self-pay | Admitting: Emergency Medicine

## 2022-04-29 DIAGNOSIS — Z833 Family history of diabetes mellitus: Secondary | ICD-10-CM

## 2022-04-29 DIAGNOSIS — E875 Hyperkalemia: Secondary | ICD-10-CM | POA: Diagnosis present

## 2022-04-29 DIAGNOSIS — E860 Dehydration: Secondary | ICD-10-CM | POA: Diagnosis present

## 2022-04-29 DIAGNOSIS — F419 Anxiety disorder, unspecified: Secondary | ICD-10-CM | POA: Diagnosis present

## 2022-04-29 DIAGNOSIS — Z86711 Personal history of pulmonary embolism: Secondary | ICD-10-CM

## 2022-04-29 DIAGNOSIS — Z832 Family history of diseases of the blood and blood-forming organs and certain disorders involving the immune mechanism: Secondary | ICD-10-CM

## 2022-04-29 DIAGNOSIS — N179 Acute kidney failure, unspecified: Secondary | ICD-10-CM

## 2022-04-29 DIAGNOSIS — Z825 Family history of asthma and other chronic lower respiratory diseases: Secondary | ICD-10-CM

## 2022-04-29 DIAGNOSIS — T07XXXA Unspecified multiple injuries, initial encounter: Secondary | ICD-10-CM

## 2022-04-29 DIAGNOSIS — Z880 Allergy status to penicillin: Secondary | ICD-10-CM

## 2022-04-29 DIAGNOSIS — J9621 Acute and chronic respiratory failure with hypoxia: Secondary | ICD-10-CM | POA: Diagnosis present

## 2022-04-29 DIAGNOSIS — Z79899 Other long term (current) drug therapy: Secondary | ICD-10-CM

## 2022-04-29 DIAGNOSIS — C3412 Malignant neoplasm of upper lobe, left bronchus or lung: Secondary | ICD-10-CM | POA: Diagnosis present

## 2022-04-29 DIAGNOSIS — I10 Essential (primary) hypertension: Secondary | ICD-10-CM | POA: Diagnosis present

## 2022-04-29 DIAGNOSIS — J441 Chronic obstructive pulmonary disease with (acute) exacerbation: Principal | ICD-10-CM | POA: Diagnosis present

## 2022-04-29 DIAGNOSIS — J9622 Acute and chronic respiratory failure with hypercapnia: Secondary | ICD-10-CM | POA: Diagnosis present

## 2022-04-29 DIAGNOSIS — E8770 Fluid overload, unspecified: Secondary | ICD-10-CM | POA: Diagnosis present

## 2022-04-29 DIAGNOSIS — Z7901 Long term (current) use of anticoagulants: Secondary | ICD-10-CM

## 2022-04-29 DIAGNOSIS — C3490 Malignant neoplasm of unspecified part of unspecified bronchus or lung: Secondary | ICD-10-CM

## 2022-04-29 DIAGNOSIS — Z9981 Dependence on supplemental oxygen: Secondary | ICD-10-CM

## 2022-04-29 DIAGNOSIS — F17210 Nicotine dependence, cigarettes, uncomplicated: Secondary | ICD-10-CM | POA: Diagnosis present

## 2022-04-29 DIAGNOSIS — Z8249 Family history of ischemic heart disease and other diseases of the circulatory system: Secondary | ICD-10-CM

## 2022-04-29 DIAGNOSIS — K219 Gastro-esophageal reflux disease without esophagitis: Secondary | ICD-10-CM | POA: Diagnosis present

## 2022-04-29 LAB — CBC WITH DIFFERENTIAL/PLATELET
Abs Immature Granulocytes: 0 10*3/uL (ref 0.00–0.07)
Basophils Absolute: 0 10*3/uL (ref 0.0–0.1)
Basophils Relative: 0 %
Eosinophils Absolute: 0 10*3/uL (ref 0.0–0.5)
Eosinophils Relative: 0 %
HCT: 41.6 % (ref 39.0–52.0)
Hemoglobin: 13.3 g/dL (ref 13.0–17.0)
Lymphocytes Relative: 7 %
Lymphs Abs: 1.1 10*3/uL (ref 0.7–4.0)
MCH: 32.7 pg (ref 26.0–34.0)
MCHC: 32 g/dL (ref 30.0–36.0)
MCV: 102.2 fL — ABNORMAL HIGH (ref 80.0–100.0)
Monocytes Absolute: 0.5 10*3/uL (ref 0.1–1.0)
Monocytes Relative: 3 %
Neutro Abs: 14.1 10*3/uL — ABNORMAL HIGH (ref 1.7–7.7)
Neutrophils Relative %: 90 %
Platelets: 297 10*3/uL (ref 150–400)
RBC: 4.07 MIL/uL — ABNORMAL LOW (ref 4.22–5.81)
RDW: 16.4 % — ABNORMAL HIGH (ref 11.5–15.5)
WBC: 15.7 10*3/uL — ABNORMAL HIGH (ref 4.0–10.5)
nRBC: 3.4 % — ABNORMAL HIGH (ref 0.0–0.2)
nRBC: 5 /100 WBC — ABNORMAL HIGH

## 2022-04-29 LAB — COMPREHENSIVE METABOLIC PANEL
ALT: 38 U/L (ref 0–44)
AST: 58 U/L — ABNORMAL HIGH (ref 15–41)
Albumin: 2.8 g/dL — ABNORMAL LOW (ref 3.5–5.0)
Alkaline Phosphatase: 133 U/L — ABNORMAL HIGH (ref 38–126)
Anion gap: 15 (ref 5–15)
BUN: 62 mg/dL — ABNORMAL HIGH (ref 6–20)
CO2: 22 mmol/L (ref 22–32)
Calcium: 8.7 mg/dL — ABNORMAL LOW (ref 8.9–10.3)
Chloride: 105 mmol/L (ref 98–111)
Creatinine, Ser: 1.46 mg/dL — ABNORMAL HIGH (ref 0.61–1.24)
GFR, Estimated: 55 mL/min — ABNORMAL LOW (ref >60)
Glucose, Bld: 69 mg/dL — ABNORMAL LOW (ref 70–99)
Potassium: 5.4 mmol/L — ABNORMAL HIGH (ref 3.5–5.1)
Sodium: 142 mmol/L (ref 135–145)
Total Bilirubin: 1.2 mg/dL (ref 0.3–1.2)
Total Protein: 6 g/dL — ABNORMAL LOW (ref 6.5–8.1)

## 2022-04-29 LAB — CBG MONITORING, ED
Glucose-Capillary: 81 mg/dL (ref 70–99)
Glucose-Capillary: 81 mg/dL (ref 70–99)

## 2022-04-29 LAB — URINALYSIS, COMPLETE (UACMP) WITH MICROSCOPIC
Bilirubin Urine: NEGATIVE
Glucose, UA: NEGATIVE mg/dL
Hgb urine dipstick: NEGATIVE
Ketones, ur: 20 mg/dL — AB
Leukocytes,Ua: NEGATIVE
Nitrite: NEGATIVE
Protein, ur: NEGATIVE mg/dL
Specific Gravity, Urine: 1.017 (ref 1.005–1.030)
pH: 5 (ref 5.0–8.0)

## 2022-04-29 LAB — PROCALCITONIN: Procalcitonin: 0.76 ng/mL

## 2022-04-29 LAB — RAPID URINE DRUG SCREEN, HOSP PERFORMED
Amphetamines: NOT DETECTED
Barbiturates: NOT DETECTED
Benzodiazepines: NOT DETECTED
Cocaine: POSITIVE — AB
Opiates: NOT DETECTED
Tetrahydrocannabinol: NOT DETECTED

## 2022-04-29 LAB — CK: Total CK: 814 U/L — ABNORMAL HIGH (ref 49–397)

## 2022-04-29 LAB — GLUCOSE, CAPILLARY: Glucose-Capillary: 172 mg/dL — ABNORMAL HIGH (ref 70–99)

## 2022-04-29 MED ORDER — SODIUM CHLORIDE 0.9 % IV BOLUS
1000.0000 mL | Freq: Once | INTRAVENOUS | Status: AC
  Administered 2022-04-29: 1000 mL via INTRAVENOUS

## 2022-04-29 MED ORDER — IPRATROPIUM-ALBUTEROL 0.5-2.5 (3) MG/3ML IN SOLN: 3.0000 mL | RESPIRATORY_TRACT | Status: DC | PRN

## 2022-04-29 MED ORDER — ALBUTEROL SULFATE (2.5 MG/3ML) 0.083% IN NEBU
5.0000 mg | INHALATION_SOLUTION | Freq: Once | RESPIRATORY_TRACT | Status: AC
  Administered 2022-04-29: 5 mg via RESPIRATORY_TRACT
  Filled 2022-04-29: qty 6

## 2022-04-29 MED ORDER — ARFORMOTEROL TARTRATE 15 MCG/2ML IN NEBU
15.0000 ug | INHALATION_SOLUTION | Freq: Two times a day (BID) | RESPIRATORY_TRACT | Status: DC
  Administered 2022-04-29 – 2022-05-02 (×7): 15 ug via RESPIRATORY_TRACT
  Filled 2022-04-29 (×7): qty 2

## 2022-04-29 MED ORDER — ACETAMINOPHEN 325 MG PO TABS: 650.0000 mg | ORAL_TABLET | Freq: Four times a day (QID) | ORAL | Status: DC | PRN

## 2022-04-29 MED ORDER — GABAPENTIN 100 MG PO CAPS
100.0000 mg | ORAL_CAPSULE | Freq: Three times a day (TID) | ORAL | Status: DC
  Administered 2022-04-29 – 2022-05-02 (×11): 100 mg via ORAL
  Filled 2022-04-29 (×11): qty 1

## 2022-04-29 MED ORDER — IPRATROPIUM-ALBUTEROL 0.5-2.5 (3) MG/3ML IN SOLN
3.0000 mL | Freq: Three times a day (TID) | RESPIRATORY_TRACT | Status: DC
  Administered 2022-04-30 – 2022-05-02 (×9): 3 mL via RESPIRATORY_TRACT
  Filled 2022-04-29 (×9): qty 3

## 2022-04-29 MED ORDER — ALBUTEROL SULFATE (2.5 MG/3ML) 0.083% IN NEBU
10.0000 mg/h | INHALATION_SOLUTION | Freq: Once | RESPIRATORY_TRACT | Status: AC
  Administered 2022-04-29: 10 mg/h via RESPIRATORY_TRACT
  Filled 2022-04-29: qty 12

## 2022-04-29 MED ORDER — APIXABAN 5 MG PO TABS
5.0000 mg | ORAL_TABLET | Freq: Two times a day (BID) | ORAL | Status: DC
  Administered 2022-04-29 – 2022-05-02 (×8): 5 mg via ORAL
  Filled 2022-04-29 (×8): qty 1

## 2022-04-29 MED ORDER — METHYLPREDNISOLONE SODIUM SUCC 125 MG IJ SOLR
125.0000 mg | Freq: Once | INTRAMUSCULAR | Status: AC
  Administered 2022-04-29: 125 mg via INTRAVENOUS
  Filled 2022-04-29: qty 2

## 2022-04-29 MED ORDER — METHYLPREDNISOLONE SODIUM SUCC 125 MG IJ SOLR
60.0000 mg | Freq: Two times a day (BID) | INTRAMUSCULAR | Status: DC
  Administered 2022-04-29 – 2022-05-02 (×6): 60 mg via INTRAVENOUS
  Filled 2022-04-29 (×7): qty 2

## 2022-04-29 MED ORDER — IPRATROPIUM-ALBUTEROL 0.5-2.5 (3) MG/3ML IN SOLN
3.0000 mL | Freq: Four times a day (QID) | RESPIRATORY_TRACT | Status: DC
  Administered 2022-04-29: 3 mL via RESPIRATORY_TRACT
  Filled 2022-04-29: qty 3

## 2022-04-29 MED ORDER — BUDESONIDE 0.5 MG/2ML IN SUSP
0.5000 mg | Freq: Two times a day (BID) | RESPIRATORY_TRACT | Status: DC
  Administered 2022-04-29 – 2022-05-02 (×7): 0.5 mg via RESPIRATORY_TRACT
  Filled 2022-04-29 (×7): qty 2

## 2022-04-29 MED ORDER — HYDROXYZINE HCL 25 MG PO TABS
25.0000 mg | ORAL_TABLET | Freq: Three times a day (TID) | ORAL | Status: DC | PRN
  Administered 2022-04-30 – 2022-05-02 (×4): 25 mg via ORAL
  Filled 2022-04-29 (×4): qty 1

## 2022-04-29 MED ORDER — DORZOLAMIDE HCL 2 % OP SOLN
1.0000 [drp] | Freq: Two times a day (BID) | OPHTHALMIC | Status: DC
Start: 2022-04-29 — End: 2022-05-03
  Administered 2022-04-29 – 2022-05-02 (×8): 1 [drp] via OPHTHALMIC
  Filled 2022-04-29 (×3): qty 10

## 2022-04-29 MED ORDER — PANTOPRAZOLE SODIUM 40 MG PO TBEC
40.0000 mg | DELAYED_RELEASE_TABLET | Freq: Every day | ORAL | Status: DC
  Administered 2022-04-29 – 2022-05-02 (×4): 40 mg via ORAL
  Filled 2022-04-29 (×4): qty 1

## 2022-04-29 MED ORDER — LATANOPROST 0.005 % OP SOLN
1.0000 [drp] | Freq: Every day | OPHTHALMIC | Status: DC
  Administered 2022-04-29 – 2022-05-02 (×4): 1 [drp] via OPHTHALMIC
  Filled 2022-04-29: qty 2.5

## 2022-04-29 MED ORDER — PROCHLORPERAZINE EDISYLATE 10 MG/2ML IJ SOLN: 5.0000 mg | Freq: Four times a day (QID) | INTRAMUSCULAR | Status: DC | PRN

## 2022-04-29 MED ORDER — ACETAMINOPHEN 650 MG RE SUPP: 650.0000 mg | Freq: Four times a day (QID) | RECTAL | Status: DC | PRN

## 2022-04-29 MED ORDER — POLYETHYLENE GLYCOL 3350 17 G PO PACK
17.0000 g | PACK | Freq: Every day | ORAL | Status: DC
  Administered 2022-04-29 – 2022-04-30 (×2): 17 g via ORAL
  Filled 2022-04-29 (×4): qty 1

## 2022-04-29 MED ORDER — BACITRACIN ZINC 500 UNIT/GM EX OINT
TOPICAL_OINTMENT | CUTANEOUS | Status: AC
  Filled 2022-04-29: qty 1.8

## 2022-04-29 MED ORDER — ATORVASTATIN CALCIUM 40 MG PO TABS
40.0000 mg | ORAL_TABLET | Freq: Every day | ORAL | Status: DC
  Administered 2022-04-29 – 2022-05-02 (×4): 40 mg via ORAL
  Filled 2022-04-29 (×4): qty 1

## 2022-04-29 MED ORDER — LOXAPINE SUCCINATE 5 MG PO CAPS
10.0000 mg | ORAL_CAPSULE | Freq: Every day | ORAL | Status: DC
  Administered 2022-04-29 – 2022-05-02 (×3): 10 mg via ORAL
  Filled 2022-04-29 (×4): qty 1

## 2022-04-29 MED ORDER — BENZTROPINE MESYLATE 1 MG PO TABS
0.5000 mg | ORAL_TABLET | Freq: Every day | ORAL | Status: DC
  Administered 2022-04-29 – 2022-05-02 (×4): 0.5 mg via ORAL
  Filled 2022-04-29 (×4): qty 1

## 2022-04-29 MED ORDER — SIMETHICONE 80 MG PO CHEW: 80.0000 mg | CHEWABLE_TABLET | Freq: Four times a day (QID) | ORAL | Status: DC | PRN

## 2022-04-29 NOTE — Assessment & Plan Note (Addendum)
improved Recheck BMP

## 2022-04-29 NOTE — ED Triage Notes (Addendum)
PT arrived via [facility] from home c/o shortness of breath. Upon EMS arrival to home SPO2 was 70's and it was discovered his O2 tubing had been disconnected. SPO2 is 96 on 6 LNC. Patients air condition has been out all  day today.  Pt has +4 pitting pedal edema bilaterally and periorbital edema.  [name]

## 2022-04-29 NOTE — Assessment & Plan Note (Signed)
Secondary to COPD exacerbation Noted to have oxygen saturation of 70% on room air Currently stable on 4 L Start DuoNebs Start Pulmicort Continue IV Solu-Medrol
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
## 2022-04-29 NOTE — Assessment & Plan Note (Signed)
Continue PPI ?

## 2022-04-29 NOTE — ED Notes (Signed)
Upper airway noise appears to be transmitted to lung [place] as crackles.X-ray show's lungs are clear. No wheezes noted.
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
## 2022-04-29 NOTE — Assessment & Plan Note (Signed)
Currently stable on 4 L continue DuoNebs and Brovana Continue Pulmicort Continue IV Solu-Medrol Add Maretta Bees
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
## 2022-04-29 NOTE — Assessment & Plan Note (Addendum)
Not on any agents prior to admission Monitor clinically

## 2022-04-29 NOTE — H&P (Signed)
History and Physical    Patient: Tony Sullivan RCV:[id number] DOB: [birth year] DOA: 04/29/2022 DOS: the patient was seen and examined on 04/29/2022 PCP: [name], FNP  Patient coming from: Home  Chief Complaint:  Chief Complaint  Patient presents with   Shortness of Breath   HPI: Tony Sullivan is a 61 year old male with a history of COPD, chronic respiratory failure on 4.5 L at home, anxiety/depression, hypertension, pulmonary embolus (03/13/2022 CTA chest) on apixaban, NSCLC diagnosed 03/22/2022 via bronchoscopy (Dr. Elsworth Soho) presenting with worsening shortness of breath over the past 2 to 3 days.  The patient states that he has not had any air conditioning at his apartment.  It has been over 90 degrees outside.  EMS was activated.  Upon their arrival they noted that the patient's oxygen was not disconnected.  The patient however reports that he does have a home health company supplies oxygen ([employer]), but he ran over his tubing causing the disconnection.  He endorses compliance with his medications.  He continues to smoke up until 1 week prior to this admission.  He denies any fevers, chills, chest pain, nausea, vomiting, diarrhea, abdominal pain.  He still has a small amount of hemoptysis intermittently. In the ED, the patient was afebrile and hemodynamically stable with oxygen saturation 100% 4 L.  WBC 15.7, hemoglobin 13.3, platelets 297,000.  Sodium 142, potassium 5.4, bicarbonate 22, serum creatinine 1.46.  AST 58, ALT 30, alkaline phosphatase 133, total bilirubin 1.2.  The patient was given Solu-Medrol 125 mg, 2 L normal saline, and albuterol nebulizers in the emergency department.  He was admitted for further evaluation and treatment.  Notably, the patient was recently discharged to a skilled nursing facility on 04/14/2022, but he left the same day to go home.  Review of Systems: As mentioned in the history of present illness. All other systems reviewed and are negative. Past Medical  History:  Diagnosis Date   Anxiety    Arthritis    COPD (chronic obstructive pulmonary disease) ([place]) 2004   diagnosed in 2004, no PFT's to date.  Started on home O2 12/2013, after found to be desatting at PCP's office, and referred to pulmonology.   Depression    GERD (gastroesophageal reflux disease)    High blood pressure    Prediabetes    Seasonal allergies    Substance abuse ([place])    Tobacco dependence    Past Surgical History:  Procedure Laterality Date   ANTERIOR CERVICAL DECOMP/DISCECTOMY FUSION N/A 09/06/2019   Procedure: Anterior Cervical Discecectomy Fusion - Cerivcal Five-Cervical Six;  Surgeon: Kary Kos, MD;  Location: [facility];  Service: Neurosurgery;  Laterality: N/A;  Anterior Cervical Discecectomy Fusion - Cerivcal Five-Cervical Six   ANTERIOR CERVICAL DISCECTOMY  09/06/2019   APPENDECTOMY  1980   COLONOSCOPY WITH PROPOFOL N/A 12/10/2016   Procedure: COLONOSCOPY WITH PROPOFOL;  Surgeon: [name], MD;  Location: [facility];  Service: Endoscopy;  Laterality: N/A;   FLEXIBLE BRONCHOSCOPY Left 03/22/2022   Procedure: FLEXIBLE BRONCHOSCOPY;  Surgeon: Rigoberto Noel, MD;  [facility];  Service: Pulmonary;  Laterality: Left;   HIP SURGERY Right    SHOULDER ARTHROSCOPY Right    Social History:  reports that he has been smoking cigarettes. He has a 9.00 pack-year smoking history. He has never used smokeless tobacco. He reports that he does not currently use alcohol after a past usage of about 14.0 - 21.0 standard drinks of alcohol per week. He reports current drug use. Drugs: Marijuana  and Cocaine.  Allergies  Allergen Reactions   Penicillins Anaphylaxis    Has patient had a PCN reaction causing immediate rash, facial/tongue/throat swelling, SOB or lightheadedness with hypotension: Yes Has patient had a PCN reaction causing severe rash involving mucus membranes or skin necrosis: No Has patient had a PCN reaction that required hospitalization No Has patient had  a PCN reaction occurring within the last 10 years: No If all of the above answers are "NO", then may proceed with Cephalosporin use.    Levocetirizine Other (See Comments)    Muscle cramps    Family History  Problem Relation Age of Onset   Emphysema Mother    Allergies Mother        "everyone in family"   Asthma Mother        "everyone in family"   Heart disease Mother    Clotting disorder Mother    Cancer Mother    Emphysema Father    Allergies Father    Allergies Son    Seizures Brother    Diabetes Brother     Prior to Admission medications   Medication Sig Start Date End Date Taking? Authorizing Provider  acetaminophen (TYLENOL) 325 MG tablet Take 2 tablets (650 mg total) by mouth every 6 (six) hours as needed for mild pain (or Fever >/= 101). 10/30/21   [name], MD  albuterol (PROVENTIL) (2.5 MG/3ML) 0.083% nebulizer solution Take 3 mLs (2.5 mg total) by nebulization every 4 (four) hours as needed for wheezing or shortness of breath. 12/12/21 12/12/22  [name], MD  albuterol (VENTOLIN HFA) 108 (90 Base) MCG/ACT inhaler Inhale 2 puffs into the lungs every 6 (six) hours as needed for wheezing or shortness of breath. 03/15/22   [name], Pratik D, DO  apixaban (ELIQUIS) 5 MG TABS tablet Take 1 tablet (5 mg total) by mouth 2 (two) times daily. 03/22/22 05/21/22  [name], Pratik D, DO  atorvastatin (LIPITOR) 40 MG tablet TAKE ONE TABLET BY MOUTH DAILY Patient taking differently: Take 40 mg by mouth daily. 07/06/20   [name], MD  benztropine (COGENTIN) 0.5 MG tablet Take 0.5 mg by mouth at bedtime.    [provider]  Budeson-Glycopyrrol-Formoterol (BREZTRI AEROSPHERE) 160-9-4.8 MCG/ACT AERO Inhale 2 puffs into the lungs in the morning and at bedtime. 12/12/21   [name], MD  diclofenac Sodium (VOLTAREN) 1 % GEL Apply 2 g topically 4 (four) times daily as needed (pain).    [provider]  dorzolamide (TRUSOPT) 2 % ophthalmic solution Place 1 drop  into the left eye 2 (two) times daily. 12/12/20   [provider]  furosemide (LASIX) 20 MG tablet Take 1 tablet (20 mg total) by mouth daily. 04/06/22   Kathie Dike, MD  gabapentin (NEURONTIN) 100 MG capsule Take 1 capsule (100 mg total) by mouth 3 (three) times daily. 03/25/22 05/24/22  Deatra James, MD  guaiFENesin 200 MG tablet Take 2 tablets (400 mg total) by mouth every 6 (six) hours as needed for cough or to loosen phlegm. 03/02/22   Orson Eva, MD  hydrOXYzine (ATARAX) 25 MG tablet Take 1 tablet (25 mg total) by mouth 3 (three) times daily as needed for anxiety. 04/05/22   Kathie Dike, MD  latanoprost (XALATAN) 0.005 % ophthalmic solution Place 1 drop into both eyes at bedtime. 05/24/19   [provider]  loxapine (LOXITANE) 10 MG capsule Take 10 mg by mouth at bedtime.     [provider]  mirtazapine (REMERON)  30 MG tablet Take 1 tablet (30 mg total) by mouth at bedtime. 10/30/21   [name], MD  Multiple Vitamin (MULTIVITAMIN WITH MINERALS) TABS tablet Take 1 tablet by mouth daily. 10/31/21   [name], MD  OXYGEN Inhale 4-5 L into the lungs as directed. 4-5 liters with sleep and exertion as needed for low oxygen    [provider]  pantoprazole (PROTONIX) 40 MG tablet Take 1 tablet (40 mg total) by mouth daily. 10/30/21   Emokpae, Courage, MD  polyethylene glycol (MIRALAX / GLYCOLAX) 17 g packet TAKE 17 GRAMS BY MOUTH DAILY Patient taking differently: Take 17 g by mouth daily. 03/09/20   [name], MD  potassium chloride (KLOR-CON M) 20 MEQ tablet Take 1 tablet (20 mEq total) by mouth daily. 04/05/22   Kathie Dike, MD  senna-docusate (SENOKOT-S) 8.6-50 MG tablet Take 2 tablets by mouth at bedtime. Patient taking differently: Take 2 tablets by mouth at bedtime as needed for mild constipation. 10/30/21   [name], MD  simethicone (MYLICON) 80 MG chewable tablet Chew 1 tablet (80 mg total) by mouth 4 (four) times daily as needed for  flatulence. Patient taking differently: Chew 80 mg by mouth every 6 (six) hours as needed for flatulence. 03/15/22   [name], DO    Physical Exam: Vitals:   04/29/22 0500 04/29/22 0600 04/29/22 0612 04/29/22 0700  BP: 126/88 117/77  125/77  Pulse: 97 91  92  Resp: _0 Temp:      TempSrc:      SpO2: 100% 100% 100% 100%  Height:       GENERAL:  A&O x 3, NAD, well developed, cooperative, follows commands HEENT: [place]/AT, No thrush, No icterus, No oral ulcers Neck:  No neck mass, No meningismus, soft, supple CV: RRR, no S3, no S4, no rub, no JVD Lungs: Bilateral expiratory wheeze. bilateral rales.  Diminished breath sounds. Abd: soft/NT +BS, nondistended Ext: 2+ LE edema, no lymphangitis, no cyanosis, no rashes Neuro:  CN II-XII intact, strength 4/5 in RUE, RLE, strength 4/5 LUE, LLE; sensation intact bilateral; no dysmetria; babinski equivocal  Data Reviewed: Data reviewed in history Assessment and Plan: * Acute exacerbation of chronic obstructive pulmonary disease (COPD) (HCC) Currently stable on 4 L Start DuoNebs Start Pulmicort Continue IV Solu-Medrol  History of pulmonary embolus (PE) Continue apixaban Diagnosed via CTA chest 03/14/2019  Non-small cell lung cancer ([place]) Diagnosed via bronchoscopy 03/22/2022 Patient has yet to follow-up with oncology Plan to set up med/onc appointment prior to discharge  AKI (acute kidney injury) ([place]) Baseline creatinine 0.8-1.1 Presented with serum creatinine 1.46 Secondary to volume depletion Patient received 2 L normal saline in the emergency department Recheck BMP  Hyperkalemia Anticipate improvement with IV fluids Recheck BMP  Acute on chronic respiratory failure with hypoxia and hypercapnia (HCC) Secondary to COPD exacerbation Noted to have oxygen saturation of 70% on room air Currently stable on 4 L Start DuoNebs Start Pulmicort Continue IV Solu-Medrol  GERD (gastroesophageal reflux disease) Continue  PPI  Essential hypertension Not on any agents prior to admission Monitor clinically      Advance Care Planning: FULL CODE  Consults: none  Family Communication: none  Severity of Illness: The appropriate patient status for this patient is INPATIENT. Inpatient status is judged to be reasonable and necessary in order to provide the required intensity of service to ensure the patient's safety. The patient's presenting symptoms, physical exam findings, and initial radiographic and laboratory data in  the context of their chronic comorbidities is felt to place them at high risk for further clinical deterioration. Furthermore, it is not anticipated that the patient will be medically stable for discharge from the hospital within 2 midnights of admission.   * I certify that at the point of admission it is my clinical judgment that the patient will require inpatient hospital care spanning beyond 2 midnights from the point of admission due to high intensity of service, high risk for further deterioration and high frequency of surveillance required.*  Author: Orson Eva, MD 04/29/2022 7:58 AM  For on call review [email or web address].

## 2022-04-29 NOTE — Assessment & Plan Note (Signed)
Diagnosed via bronchoscopy 03/22/2022 Patient has yet to follow-up with oncology Plan to set up med/onc appointment prior to discharge

## 2022-04-29 NOTE — TOC Progression Note (Signed)
  Transition of Care [facility]) Screening Note   Patient Details  Name: Tony Sullivan Date of Birth: 21-Feb-1961   Transition of Care [facility]) CM/SW Contact:    Shade Flood, LCSW Phone Number: 04/29/2022, 12:02 PM    Pt well known to TOC from previous admissions. Plan is for dc home when stable. Spoke with [name] at [facility] who confirms that pt has received his NIV from [employer] and their RT is working with pt on a scheduled basis for education and follow up. Updated MD.  Transition of Care Department [facility]) has reviewed patient and no TOC needs have been identified at this time. We will continue to monitor patient advancement through interdisciplinary progression rounds. If new patient transition needs arise, please place a TOC consult.

## 2022-04-29 NOTE — Assessment & Plan Note (Signed)
Baseline creatinine 0.8-1.1 Presented with serum creatinine 1.46 Secondary to volume depletion Patient received 2 L normal saline in the emergency department Recheck BMP

## 2022-04-29 NOTE — Assessment & Plan Note (Addendum)
Continue apixaban Diagnosed via CTA chest 03/14/2019

## 2022-04-30 DIAGNOSIS — Z86711 Personal history of pulmonary embolism: Secondary | ICD-10-CM | POA: Diagnosis not present

## 2022-04-30 DIAGNOSIS — Z79899 Other long term (current) drug therapy: Secondary | ICD-10-CM | POA: Diagnosis not present

## 2022-04-30 DIAGNOSIS — E8770 Fluid overload, unspecified: Secondary | ICD-10-CM | POA: Diagnosis present

## 2022-04-30 DIAGNOSIS — F17210 Nicotine dependence, cigarettes, uncomplicated: Secondary | ICD-10-CM | POA: Diagnosis present

## 2022-04-30 DIAGNOSIS — Z8249 Family history of ischemic heart disease and other diseases of the circulatory system: Secondary | ICD-10-CM | POA: Diagnosis not present

## 2022-04-30 DIAGNOSIS — I10 Essential (primary) hypertension: Secondary | ICD-10-CM | POA: Diagnosis present

## 2022-04-30 DIAGNOSIS — Z7901 Long term (current) use of anticoagulants: Secondary | ICD-10-CM | POA: Diagnosis not present

## 2022-04-30 DIAGNOSIS — J9622 Acute and chronic respiratory failure with hypercapnia: Secondary | ICD-10-CM | POA: Diagnosis present

## 2022-04-30 DIAGNOSIS — K219 Gastro-esophageal reflux disease without esophagitis: Secondary | ICD-10-CM | POA: Diagnosis present

## 2022-04-30 DIAGNOSIS — J9621 Acute and chronic respiratory failure with hypoxia: Secondary | ICD-10-CM | POA: Diagnosis present

## 2022-04-30 DIAGNOSIS — E860 Dehydration: Secondary | ICD-10-CM | POA: Diagnosis present

## 2022-04-30 DIAGNOSIS — J441 Chronic obstructive pulmonary disease with (acute) exacerbation: Secondary | ICD-10-CM | POA: Diagnosis present

## 2022-04-30 DIAGNOSIS — N179 Acute kidney failure, unspecified: Secondary | ICD-10-CM | POA: Diagnosis present

## 2022-04-30 DIAGNOSIS — C3412 Malignant neoplasm of upper lobe, left bronchus or lung: Secondary | ICD-10-CM | POA: Diagnosis present

## 2022-04-30 DIAGNOSIS — Z832 Family history of diseases of the blood and blood-forming organs and certain disorders involving the immune mechanism: Secondary | ICD-10-CM | POA: Diagnosis not present

## 2022-04-30 DIAGNOSIS — Z825 Family history of asthma and other chronic lower respiratory diseases: Secondary | ICD-10-CM | POA: Diagnosis not present

## 2022-04-30 DIAGNOSIS — Z880 Allergy status to penicillin: Secondary | ICD-10-CM | POA: Diagnosis not present

## 2022-04-30 DIAGNOSIS — E875 Hyperkalemia: Secondary | ICD-10-CM | POA: Diagnosis present

## 2022-04-30 DIAGNOSIS — Z9981 Dependence on supplemental oxygen: Secondary | ICD-10-CM | POA: Diagnosis not present

## 2022-04-30 DIAGNOSIS — Z833 Family history of diabetes mellitus: Secondary | ICD-10-CM | POA: Diagnosis not present

## 2022-04-30 DIAGNOSIS — F419 Anxiety disorder, unspecified: Secondary | ICD-10-CM | POA: Diagnosis present

## 2022-04-30 LAB — PROCALCITONIN: Procalcitonin: 0.25 ng/mL

## 2022-04-30 LAB — BASIC METABOLIC PANEL
Anion gap: 7 (ref 5–15)
BUN: 30 mg/dL — ABNORMAL HIGH (ref 6–20)
CO2: 31 mmol/L (ref 22–32)
Calcium: 8.7 mg/dL — ABNORMAL LOW (ref 8.9–10.3)
Chloride: 105 mmol/L (ref 98–111)
Creatinine, Ser: 0.97 mg/dL (ref 0.61–1.24)
GFR, Estimated: >60 mL/min (ref >60)
Glucose, Bld: 189 mg/dL — ABNORMAL HIGH (ref 70–99)
Potassium: 4.1 mmol/L (ref 3.5–5.1)
Sodium: 143 mmol/L (ref 135–145)

## 2022-04-30 LAB — CBC
HCT: 34.1 % — ABNORMAL LOW (ref 39.0–52.0)
Hemoglobin: 10.3 g/dL — ABNORMAL LOW (ref 13.0–17.0)
MCH: 31.9 pg (ref 26.0–34.0)
MCHC: 30.2 g/dL (ref 30.0–36.0)
MCV: 105.6 fL — ABNORMAL HIGH (ref 80.0–100.0)
Platelets: 240 10*3/uL (ref 150–400)
RBC: 3.23 MIL/uL — ABNORMAL LOW (ref 4.22–5.81)
RDW: 16.8 % — ABNORMAL HIGH (ref 11.5–15.5)
WBC: 11.4 10*3/uL — ABNORMAL HIGH (ref 4.0–10.5)
nRBC: 1.4 % — ABNORMAL HIGH (ref 0.0–0.2)

## 2022-04-30 LAB — GLUCOSE, CAPILLARY
Glucose-Capillary: 172 mg/dL — ABNORMAL HIGH (ref 70–99)
Glucose-Capillary: 193 mg/dL — ABNORMAL HIGH (ref 70–99)
Glucose-Capillary: 196 mg/dL — ABNORMAL HIGH (ref 70–99)
Glucose-Capillary: 202 mg/dL — ABNORMAL HIGH (ref 70–99)

## 2022-04-30 LAB — URINE CULTURE

## 2022-04-30 LAB — MAGNESIUM: Magnesium: 2.4 mg/dL (ref 1.7–2.4)

## 2022-04-30 LAB — CK: Total CK: 294 U/L (ref 49–397)

## 2022-04-30 LAB — BRAIN NATRIURETIC PEPTIDE: B Natriuretic Peptide: 194 pg/mL — ABNORMAL HIGH (ref 0.0–100.0)

## 2022-04-30 MED ORDER — REVEFENACIN 175 MCG/3ML IN SOLN
175.0000 ug | Freq: Every day | RESPIRATORY_TRACT | Status: DC
  Administered 2022-05-01 – 2022-05-02 (×2): 175 ug via RESPIRATORY_TRACT
  Filled 2022-04-30 (×2): qty 3

## 2022-04-30 MED ORDER — NALOXONE HCL 0.4 MG/ML IJ SOLN
INTRAMUSCULAR | Status: AC
  Filled 2022-04-30: qty 1

## 2022-04-30 MED ORDER — DOXYCYCLINE HYCLATE 100 MG PO TABS
100.0000 mg | ORAL_TABLET | Freq: Two times a day (BID) | ORAL | Status: DC
  Administered 2022-04-30 – 2022-05-02 (×6): 100 mg via ORAL
  Filled 2022-04-30 (×6): qty 1

## 2022-04-30 MED ORDER — FUROSEMIDE 10 MG/ML IJ SOLN
40.0000 mg | Freq: Once | INTRAMUSCULAR | Status: AC
  Administered 2022-04-30: 40 mg via INTRAVENOUS
  Filled 2022-04-30: qty 4

## 2022-04-30 NOTE — Plan of Care (Signed)

## 2022-04-30 NOTE — Progress Notes (Signed)
PROGRESS NOTE  Tony Sullivan KDX:[id number] DOB: 1961-10-21 DOA: 04/29/2022 PCP: [name], FNP  Brief History:  61 year old male with a history of COPD, chronic respiratory failure on 4.5 L at home, anxiety/depression, hypertension, pulmonary embolus (03/13/2022 CTA chest) on apixaban, NSCLC diagnosed 03/22/2022 via bronchoscopy (Dr. Elsworth Soho) presenting with worsening shortness of breath over the past 2 to 3 days.  The patient states that he has not had any air conditioning at his apartment.  It has been over 90 degrees outside.  EMS was activated.  Upon their arrival they noted that the patient's oxygen was not disconnected.  The patient however reports that he does have a home health company supplies oxygen ([employer]), but he ran over his tubing causing the disconnection.  He endorses compliance with his medications.  He continues to smoke up until 1 week prior to this admission.  He denies any fevers, chills, chest pain, nausea, vomiting, diarrhea, abdominal pain.  He still has a small amount of hemoptysis intermittently. In the ED, the patient was afebrile and hemodynamically stable with oxygen saturation 100% 4 L.  WBC 15.7, hemoglobin 13.3, platelets 297,000.  Sodium 142, potassium 5.4, bicarbonate 22, serum creatinine 1.46.  AST 58, ALT 30, alkaline phosphatase 133, total bilirubin 1.2.  The patient was given Solu-Medrol 125 mg, 2 L normal saline, and albuterol nebulizers in the emergency department.  He was admitted for further evaluation and treatment.  Notably, the patient was recently discharged to a skilled nursing facility on 04/14/2022, but he left the same day to go home.   Assessment/Plan:   Principal Problem:   Acute exacerbation of chronic obstructive pulmonary disease (COPD) (HCC) Active Problems:   Essential hypertension   GERD (gastroesophageal reflux disease)   Acute on chronic respiratory failure with hypoxia and hypercapnia (HCC)   Hyperkalemia   AKI (acute  kidney injury) ([place])   Non-small cell lung cancer ([place])   History of pulmonary embolus (PE)  Assessment and Plan: * Acute exacerbation of chronic obstructive pulmonary disease (COPD) (HCC) Currently stable on 4 L continue DuoNebs and Brovana Continue Pulmicort Continue IV Solu-Medrol Add Yupelri  History of pulmonary embolus (PE) Continue apixaban Diagnosed via CTA chest 03/14/2019  Non-small cell lung cancer ([place]) Diagnosed via bronchoscopy 03/22/2022 Patient has yet to follow-up with oncology Plan to set up med/onc appointment prior to discharge  AKI (acute kidney injury) ([place]) Baseline creatinine 0.8-1.1 Presented with serum creatinine 1.46 Secondary to volume depletion Patient received 2 L normal saline in the emergency department Now slightly hypervolemic>>lasix IV x 1 on 7/4 Recheck BMP  Hyperkalemia improved Recheck BMP  Acute on chronic respiratory failure with hypoxia and hypercapnia (HCC) Secondary to COPD exacerbation Noted to have oxygen saturation of 70% on room air Currently stable on 4 L Continue DuoNebs and Brovana Continue Pulmicort Continue IV Solu-Medrol 04/30/22--Lasix IV 40 mg x 1  GERD (gastroesophageal reflux disease) Continue PPI  Essential hypertension Not on any agents prior to admission BP controlled Monitor clinically     Family Communication:   no Family at bedside  Consultants:  none  Code Status:  FULL   DVT Prophylaxis:  apixaban   Procedures: As Listed in Progress Note Above  Antibiotics: Doxy 7/4>>        Subjective: Patient states sob not much better.  Has cough with clear sputum.  Denies f/c, cp, n/v/d, abd pain  Objective: Vitals:   04/30/22 0509 04/30/22 0749 04/30/22 1354 04/30/22  1435  BP: (!) 125/96  (!) 133/91   Pulse: 62  88   Resp: 15  (!) 22   Temp: 97.8 F (36.6 C)  98.4 F (36.9 C)   TempSrc:   Oral   SpO2: 90% 92% 93% 93%  Weight:      Height:        Intake/Output Summary (Last 24  hours) at 04/30/2022 1716 Last data filed at 04/30/2022 1400 Gross per 24 hour  Intake 240 ml  Output 525 ml  Net -285 ml   Weight change:  Exam:  General:  Pt is alert, follows commands appropriately, not in acute distress HEENT: No icterus, No thrush, No neck mass, [place]/AT Cardiovascular: RRR, S1/S2, no rubs, no gallops Respiratory: bilateral rhonchi.  Bibasilar wheeze Abdomen: Soft/+BS, non tender, non distended, no guarding Extremities: 1 + LE edema, No lymphangitis, No petechiae, No rashes, no synovitis   Data Reviewed: I have personally reviewed following labs and imaging studies Basic Metabolic Panel: Recent Labs  Lab 04/29/22 0411 04/30/22 1451  NA 142 143  K 5.4* 4.1  CL 105 105  CO2 22 31  GLUCOSE 69* 189*  BUN 62* 30*  CREATININE 1.46* 0.97  CALCIUM 8.7* 8.7*  MG  --  2.4   Liver Function Tests: Recent Labs  Lab 04/29/22 0411  AST 58*  ALT 38  ALKPHOS 133*  BILITOT 1.2  PROT 6.0*  ALBUMIN 2.8*   No results for input(s): "LIPASE", "AMYLASE" in the last 168 hours. No results for input(s): "AMMONIA" in the last 168 hours. Coagulation Profile: No results for input(s): "INR", "PROTIME" in the last 168 hours. CBC: Recent Labs  Lab 04/29/22 0515 04/30/22 1544  WBC 15.7* 11.4*  NEUTROABS 14.1*  --   HGB 13.3 10.3*  HCT 41.6 34.1*  MCV 102.2* 105.6*  PLT 297 240   Cardiac Enzymes: Recent Labs  Lab 04/29/22 0411 04/30/22 1451  CKTOTAL 814* 294   BNP: Invalid input(s): "POCBNP" CBG: Recent Labs  Lab 04/29/22 1724 04/30/22 0026 04/30/22 0512 04/30/22 1153 04/30/22 1653  GLUCAP 172* 172* 193* 196* 202*   HbA1C: No results for input(s): "HGBA1C" in the last 72 hours. Urine analysis:    Component Value Date/Time   COLORURINE YELLOW 04/29/2022 0803   APPEARANCEUR HAZY (A) 04/29/2022 0803   LABSPEC 1.017 04/29/2022 0803   PHURINE 5.0 04/29/2022 0803   GLUCOSEU NEGATIVE 04/29/2022 0803   HGBUR NEGATIVE 04/29/2022 0803   BILIRUBINUR  NEGATIVE 04/29/2022 0803   KETONESUR 20 (A) 04/29/2022 0803   PROTEINUR NEGATIVE 04/29/2022 0803   UROBILINOGEN 1.0 06/12/2015 1021   NITRITE NEGATIVE 04/29/2022 0803   LEUKOCYTESUR NEGATIVE 04/29/2022 0803   Sepsis Labs: @LABRCNTIP (procalcitonin:4,lacticidven:4) ) Recent Results (from the past 240 hour(s))  Urine Culture     Status: Abnormal   Collection Time: 04/29/22  8:03 AM   Specimen: Urine, Clean Catch  Result Value Ref Range Status   Specimen Description   Final    URINE, CLEAN CATCH Performed at [facility], [address], [place], [place] [address]    Special Requests   Final    NONE Performed at [facility], [address], [place], [place] [address]    Culture MULTIPLE SPECIES PRESENT, SUGGEST RECOLLECTION (A)  Final   Report Status 04/30/2022 FINAL  Final     Scheduled Meds:  apixaban  5 mg Oral BID   arformoterol  15 mcg Nebulization BID   atorvastatin  40 mg Oral Daily   benztropine  0.5 mg  Oral QHS   budesonide (PULMICORT) nebulizer solution  0.5 mg Nebulization BID   dorzolamide  1 drop Left Eye BID   doxycycline  100 mg Oral Q12H   gabapentin  100 mg Oral TID   ipratropium-albuterol  3 mL Nebulization TID   latanoprost  1 drop Both Eyes QHS   loxapine  10 mg Oral QHS   methylPREDNISolone (SOLU-MEDROL) injection  60 mg Intravenous Q12H   pantoprazole  40 mg Oral Daily   polyethylene glycol  17 g Oral Daily   [START ON 05/01/2022] revefenacin  175 mcg Nebulization Daily   Continuous Infusions:  Procedures/Studies: DG Chest Port 1 View  Result Date: 04/29/2022 CLINICAL DATA:  Shortness of breath and hypoxemia. EXAM: PORTABLE CHEST 1 VIEW COMPARISON:  Portable chest 04/22/2022. FINDINGS: The lungs are emphysematous and clear of acute infiltrates. There is scattered linear scarring. Heart size and vasculature are normal with stable mediastinal silhouette. Mild aortic atherosclerosis. Osteopenia. IMPRESSION: Stable COPD chest with no acute cardiopulmonary  findings. Electronically Signed   By: Telford Nab M.D.   On: 04/29/2022 03:14   DG Chest Port 1 View  Result Date: 04/22/2022 CLINICAL DATA:  Progressive shortness of breath. Personal history of COPD. EXAM: PORTABLE CHEST 1 VIEW COMPARISON:  One-view chest x-ray 04/12/2022 FINDINGS: Heart size is normal. Changes of COPD are noted. No edema or effusion is present. No focal airspace disease is present. IMPRESSION: 1. COPD. 2. No acute cardiopulmonary disease. Electronically Signed   By: San Morelle M.D.   On: 04/22/2022 14:06   DG Chest Port 1 View  Result Date: 04/12/2022 CLINICAL DATA:  Pain EXAM: PORTABLE CHEST 1 VIEW COMPARISON:  Same day chest x-ray FINDINGS: Stable cardiomediastinal contours. Chronic emphysematous lung changes. No new focal airspace consolidation, pleural effusion, or pneumothorax. Chronic posterior right sixth rib fracture. IMPRESSION: No active disease. Electronically Signed   By: Davina Poke D.O.   On: 04/12/2022 17:12   DG Chest Port 1 View  Result Date: 04/12/2022 CLINICAL DATA:  61 year old male with history of shortness of breath and chest pain. EXAM: PORTABLE CHEST 1 VIEW COMPARISON:  Chest x-ray 04/06/2022. FINDINGS: Lung volumes are normal. No consolidative airspace disease. No pleural effusions. No pneumothorax. No pulmonary nodule or mass noted. Pulmonary vasculature and the cardiomediastinal silhouette are within normal limits. Atherosclerosis in the thoracic aorta. Old healed fracture of the posterolateral aspect of the right sixth rib incidentally noted. IMPRESSION: 1. No radiographic evidence of acute cardiopulmonary disease. 2. Aortic atherosclerosis. Electronically Signed   By: Vinnie Langton M.D.   On: 04/12/2022 06:14   DG Chest Portable 1 View  Result Date: 04/06/2022 CLINICAL DATA:  Evaluate for shortness of breath. EXAM: PORTABLE CHEST 1 VIEW COMPARISON:  04/01/2022 FINDINGS: Stable cardiomediastinal contours. The lungs are hyperinflated  with interstitial changes of emphysema. No pleural effusion or edema identified. No airspace opacities. Unchanged right posterior fifth rib fracture. IMPRESSION: 1.  Emphysema (ICD10-J43.9). 2. No acute cardiopulmonary abnormalities. Electronically Signed   By: Kerby Moors M.D.   On: 04/06/2022 06:43   DG Chest Portable 1 View  Result Date: 04/01/2022 CLINICAL DATA:  Chest pain and shortness of breath. EXAM: PORTABLE CHEST 1 VIEW COMPARISON:  March 28, 2022 FINDINGS: The heart size and mediastinal contours are within normal limits. Lungs are hyperinflated with flattening of the diaphragms and evidence of moderate severity emphysematous lung disease. There is no evidence of focal consolidation, pleural effusion or pneumothorax. A radiopaque fusion plate and screws are seen overlying the cervical  spine. Chronic left-sided rib fractures are noted. IMPRESSION: COPD without evidence of acute or active cardiopulmonary disease. Electronically Signed   By: Virgina Norfolk M.D.   On: 04/01/2022 02:24    Orson Eva, DO  [facility]  If 7PM-7AM, please contact night-coverage [email or web address] Password [email or web address] 04/30/2022, 5:16 PM   LOS: 0 days

## 2022-05-01 DIAGNOSIS — C3490 Malignant neoplasm of unspecified part of unspecified bronchus or lung: Secondary | ICD-10-CM

## 2022-05-01 DIAGNOSIS — E875 Hyperkalemia: Secondary | ICD-10-CM

## 2022-05-01 DIAGNOSIS — J9621 Acute and chronic respiratory failure with hypoxia: Secondary | ICD-10-CM | POA: Diagnosis not present

## 2022-05-01 DIAGNOSIS — J441 Chronic obstructive pulmonary disease with (acute) exacerbation: Secondary | ICD-10-CM | POA: Diagnosis not present

## 2022-05-01 DIAGNOSIS — K219 Gastro-esophageal reflux disease without esophagitis: Secondary | ICD-10-CM

## 2022-05-01 DIAGNOSIS — I10 Essential (primary) hypertension: Secondary | ICD-10-CM

## 2022-05-01 DIAGNOSIS — N179 Acute kidney failure, unspecified: Secondary | ICD-10-CM | POA: Diagnosis not present

## 2022-05-01 LAB — GLUCOSE, CAPILLARY
Glucose-Capillary: 142 mg/dL — ABNORMAL HIGH (ref 70–99)
Glucose-Capillary: 155 mg/dL — ABNORMAL HIGH (ref 70–99)
Glucose-Capillary: 169 mg/dL — ABNORMAL HIGH (ref 70–99)
Glucose-Capillary: 184 mg/dL — ABNORMAL HIGH (ref 70–99)
Glucose-Capillary: 190 mg/dL — ABNORMAL HIGH (ref 70–99)
Glucose-Capillary: 200 mg/dL — ABNORMAL HIGH (ref 70–99)

## 2022-05-01 MED ORDER — FUROSEMIDE 10 MG/ML IJ SOLN
20.0000 mg | Freq: Once | INTRAMUSCULAR | Status: AC
  Administered 2022-05-01: 20 mg via INTRAVENOUS
  Filled 2022-05-01: qty 2

## 2022-05-01 MED ORDER — DM-GUAIFENESIN ER 30-600 MG PO TB12
1.0000 | ORAL_TABLET | Freq: Two times a day (BID) | ORAL | Status: DC
  Administered 2022-05-01 – 2022-05-02 (×4): 1 via ORAL
  Filled 2022-05-01 (×4): qty 1

## 2022-05-01 NOTE — TOC Initial Note (Signed)
Transition of Care [facility]) - Initial/Assessment Note    Patient Details  Name: Tony Sullivan MRN: [id number] Date of Birth: 1961/05/28  Transition of Care [facility]) CM/SW Contact:    Salome Arnt, LCSW Phone Number: 05/01/2022, 1:09 PM  Clinical Narrative:  Pt admitted with COPD exacerbation. Assessment completed due to high risk readmission score. Pt is well known to TOC from previous admissions. He lives alone and states he is independent with ADLs. [employer] recently provided NIV for pt and RT is following for education. Pt states he is short of breath and not interested in further questions. TOC will continue to follow.                   Expected Discharge Plan: Home/Self Care Barriers to Discharge: Continued Medical Work up   Patient Goals and CMS Choice     Choice offered to / list presented to : Patient  Expected Discharge Plan and Services Expected Discharge Plan: Home/Self Care In-house Referral: Clinical Social Work     Living arrangements for the past 2 months: Single Family Home                                      Prior Living Arrangements/Services Living arrangements for the past 2 months: Single Family Home Lives with:: Self Patient language and need for interpreter reviewed:: Yes Do you feel safe going back to the place where you live?: Yes      Need for Family Participation in Patient Care: No (Comment)   Current home services: DME Criminal Activity/Legal Involvement Pertinent to Current Situation/Hospitalization: No - Comment as needed  Activities of Daily Living Home Assistive Devices/Equipment: None ADL Screening (condition at time of admission) Patient's cognitive ability adequate to safely complete daily activities?: Yes Is the patient deaf or have difficulty hearing?: No Does the patient have difficulty seeing, even when wearing glasses/contacts?: No Does the patient have difficulty concentrating, remembering, or making decisions?:  No Patient able to express need for assistance with ADLs?: Yes Does the patient have difficulty dressing or bathing?: Yes Independently performs ADLs?: No Communication: Needs assistance Is this a change from baseline?: Pre-admission baseline Dressing (OT): Needs assistance Is this a change from baseline?: Pre-admission baseline Grooming: Needs assistance Is this a change from baseline?: Pre-admission baseline Feeding: Needs assistance Is this a change from baseline?: Pre-admission baseline Bathing: Needs assistance Is this a change from baseline?: Pre-admission baseline Toileting: Needs assistance Is this a change from baseline?: Pre-admission baseline In/Out Bed: Needs assistance Is this a change from baseline?: Pre-admission baseline Walks in Home: Dependent Is this a change from baseline?: Pre-admission baseline Does the patient have difficulty walking or climbing stairs?: Yes Weakness of Legs: Both Weakness of Arms/Hands: None  Permission Sought/Granted                  Emotional Assessment     Affect (typically observed): Irritable Orientation: : Oriented to Self, Oriented to Place, Oriented to  Time, Oriented to Situation Alcohol / Substance Use: Not Applicable Psych Involvement: No (comment)  Admission diagnosis:  Dehydration [E86.0] Multiple wounds [T07.XXXA] Acute exacerbation of chronic obstructive pulmonary disease (COPD) ([place]) [J44.1] COPD exacerbation (HCC) [J44.1] AKI (acute kidney injury) ([place]) [N17.9] Acute on chronic respiratory failure with hypoxia and hypercapnia (HCC) [K02.54, J96.22] Patient Active Problem List   Diagnosis Date Noted   Acute on chronic respiratory failure with hypoxia and hypercapnia (  [place]) 04/29/2022   Hyperkalemia 04/29/2022   AKI (acute kidney injury) ([place]) 04/29/2022   Non-small cell lung cancer ([place]) 04/29/2022   History of pulmonary embolus (PE) 04/29/2022   H/o Pulmonary embolism ([place]) 04/01/2022   Mass of upper lobe  of left lung    Thoracic compression fracture ([place]) 03/20/2022   Hemoptysis 03/20/2022   Pneumonia 03/13/2022   COPD exacerbation ([place]) 02/27/2022   Pedal edema 02/19/2022   CAP (community acquired pneumonia) 01/07/2022   Prolonged QT interval 01/07/2022   Glaucoma 01/07/2022   Elevated troponin 10/27/2021   Hyperglycemia 10/27/2021   Elevated MCV 10/27/2021   Respiratory failure with hypoxia and hypercapnia ([place]) 10/27/2021   Acute exacerbation of chronic obstructive pulmonary disease (COPD) ([place]) 10/02/2021   Hypophosphatemia 10/02/2021   Hypoalbuminemia due to protein-calorie malnutrition ([place]) 10/02/2021   Physical deconditioning 07/26/2021   Numbness and tingling in left arm 05/06/2021   Leukocytosis 05/05/2021   Severe headache 05/05/2021   Mixed hyperlipidemia 05/24/2020   Hypokalemia 05/24/2020   Chest pain 05/23/2020   Bipolar disorder ([place]) 05/23/2020   HNP (herniated nucleus pulposus), cervical 09/06/2019   Abdominal pain    Poor dentition 11/04/2017   Acute on chronic respiratory failure with hypercapnia ([place]) 08/29/2017   ETOH abuse 03/30/2017   Cigarette smoker 03/30/2017   Acute respiratory failure with hypoxia and hypercapnia (HCC) 03/29/2017   Special screening for malignant neoplasms, colon    Benign neoplasm of ascending colon    Benign neoplasm of descending colon    Hoarseness 08/22/2016   GERD (gastroesophageal reflux disease) 08/22/2016   Atypical chest pain 12/05/2015   Essential hypertension 06/15/2014   Pectoralis muscle strain 01/11/2014   COPD with acute exacerbation ([place]) 01/10/2014   COPD (chronic obstructive pulmonary disease) ([place]) 01/08/2014   Tobacco abuse 01/08/2014   Cough 01/08/2014   Chronic respiratory failure with hypoxia ([place]) 01/08/2014   PCP:  [name], FNP Pharmacy:   [facility], [address] AT [address] [place]  [address] Phone: [phone] Fax: [phone]     Social Determinants of Health (SDOH) Interventions    Readmission Risk Interventions    05/01/2022    7:51 AM 03/14/2022    3:54 PM 02/28/2022   10:40 AM  Readmission Risk Prevention Plan  Transportation Screening Complete Complete Complete  Medication Review Press photographer) Complete Complete Complete  PCP or Specialist appointment within 3-5 days of discharge  Not Complete   HRI or [place] Complete Complete Complete  SW Recovery Care/Counseling Consult Complete Complete Complete  Palliative Care Screening Not Applicable Not Applicable Not [place] Not Applicable Patient Refused Not Applicable

## 2022-05-01 NOTE — Plan of Care (Signed)

## 2022-05-01 NOTE — Progress Notes (Signed)
Humidification and extension added to 02  O2 decreased from 6lpm cann to 4lpm cann patients spo2 100% on 6lpm patients home regimen is 3-4lpm ann, patients spo2 100% on 4lpm cann  Nurse informed

## 2022-05-01 NOTE — Progress Notes (Signed)
PROGRESS NOTE  LADEN FIELDHOUSE AYT:[id number] DOB: [birth year] DOA: 04/29/2022 PCP: [name], FNP  Brief History:  61 year old male with a history of COPD, chronic respiratory failure on 4.5 L at home, anxiety/depression, hypertension, pulmonary embolus (03/13/2022 CTA chest) on apixaban, NSCLC diagnosed 03/22/2022 via bronchoscopy (Dr. Elsworth Soho) presenting with worsening shortness of breath over the past 2 to 3 days.  The patient states that he has not had any air conditioning at his apartment.  It has been over 90 degrees outside.  EMS was activated.  Upon their arrival they noted that the patient's oxygen was not disconnected.  The patient however reports that he does have a home health company supplies oxygen ([employer]), but he ran over his tubing causing the disconnection.  He endorses compliance with his medications.  He continues to smoke up until 1 week prior to this admission.  He denies any fevers, chills, chest pain, nausea, vomiting, diarrhea, abdominal pain.  He still has a small amount of hemoptysis intermittently. In the ED, the patient was afebrile and hemodynamically stable with oxygen saturation 100% 4 L.  WBC 15.7, hemoglobin 13.3, platelets 297,000.  Sodium 142, potassium 5.4, bicarbonate 22, serum creatinine 1.46.  AST 58, ALT 30, alkaline phosphatase 133, total bilirubin 1.2.  The patient was given Solu-Medrol 125 mg, 2 L normal saline, and albuterol nebulizers in the emergency department.  He was admitted for further evaluation and treatment.  Notably, the patient was recently discharged to a skilled nursing facility on 04/14/2022, but he left the same day to go home.   Assessment/Plan:   Principal Problem:   Acute exacerbation of chronic obstructive pulmonary disease (COPD) (HCC) Active Problems:   Essential hypertension   GERD (gastroesophageal reflux disease)   Acute on chronic respiratory failure with hypoxia and hypercapnia (HCC)   Hyperkalemia   AKI (acute  kidney injury) ([place])   Non-small cell lung cancer ([place])   History of pulmonary embolus (PE)  Assessment and Plan: * Acute exacerbation of chronic obstructive pulmonary disease (COPD) (HCC) -Currently stable on 4-5 L (baseline for patient). -Continue the use of DuoNeb, Brovana, Pulmicort, steroids and Yupelri  -Flutter valve and mucolytic's has been added.   -Follow clinical response.   -Patient reports feeling better but not back to baseline.  History of pulmonary embolus (PE) -Continue apixaban -Diagnosed via CTA chest 03/14/2019  Non-small cell lung cancer ([place]) Diagnosed via bronchoscopy 03/22/2022 Patient has yet to follow-up with oncology Plan to set up med/onc appointment prior to discharge  AKI (acute kidney injury) ([place]) -Baseline creatinine 0.8-1.1 -Presented with serum creatinine 1.46 -Secondary to volume depletion most likely  -Patient received 2 L normal saline in the emergency department -continue to be slightly hypervolemic. -will repeat lasix -follow renal function.  Hyperkalemia -improved -Continue to follow electrolytes trend/stability.  Acute on chronic respiratory failure with hypoxia and hypercapnia (HCC) -Secondary to COPD exacerbation -Noted to have oxygen saturation of 70% on room air -Currently stable on 4-5 L (baseline) -Continue DuoNebs and Brovana -Continue Pulmicort -Continue IV Solu-Medrol -repeat Lasix IV 20 mg x 1  GERD (gastroesophageal reflux disease) -Continue PPI  Essential hypertension -Not on any agents prior to admission -BP controlled -Continue to monitor clinically. -Heart healthy diet discussed with patient.     Family Communication:   no Family at bedside  Consultants:  none  Code Status:  FULL   DVT Prophylaxis:  apixaban   Procedures: As Listed in Progress Note Above  Antibiotics: Doxy 7/4>>   Subjective: Having difficulty speaking in full sentences; no chest pain, no nausea or vomiting.  Currently  afebrile.  Short winded with activity and using intercostal muscles mainly on exertion.  Objective: Vitals:   05/01/22 0740 05/01/22 0745 05/01/22 1432 05/01/22 1618  BP:    (!) 154/97  Pulse:    98  Resp:      Temp:      TempSrc:      SpO2: 100% 100% 100% 97%  Weight:      Height:        Intake/Output Summary (Last 24 hours) at 05/01/2022 1949 Last data filed at 05/01/2022 1700 Gross per 24 hour  Intake 480 ml  Output 500 ml  Net -20 ml   Weight change:   Exam: General exam: Alert, awake, oriented x 3; no chest pain, no nausea, no vomiting.  Still having difficulty speaking in full sentences and feeling short winded with activity. Respiratory system: Fair air movement, positive rhonchi bilaterally; positive expiratory wheezing.  Mild use of intercostal muscles. Cardiovascular system: Rate controlled, no rubs, no gallops, no JVD. Gastrointestinal system: Abdomen is nondistended, soft and nontender. No organomegaly or masses felt. Normal bowel sounds heard. Central nervous system: Alert and oriented. No focal neurological deficits. Extremities: No cyanosis or clubbing; positive trace to 1+ edema bilaterally. Skin: No petechiae. Psychiatry: Judgement and insight appear normal. Mood & affect appropriate.    Data Reviewed: I have personally reviewed following labs and imaging studies  Basic Metabolic Panel: Recent Labs  Lab 04/29/22 0411 04/30/22 1451  NA 142 143  K 5.4* 4.1  CL 105 105  CO2 22 31  GLUCOSE 69* 189*  BUN 62* 30*  CREATININE 1.46* 0.97  CALCIUM 8.7* 8.7*  MG  --  2.4   Liver Function Tests: Recent Labs  Lab 04/29/22 0411  AST 58*  ALT 38  ALKPHOS 133*  BILITOT 1.2  PROT 6.0*  ALBUMIN 2.8*   CBC: Recent Labs  Lab 04/29/22 0515 04/30/22 1544  WBC 15.7* 11.4*  NEUTROABS 14.1*  --   HGB 13.3 10.3*  HCT 41.6 34.1*  MCV 102.2* 105.6*  PLT 297 240   Cardiac Enzymes: Recent Labs  Lab 04/29/22 0411 04/30/22 1451  CKTOTAL 814* 294    BNP: Invalid input(s): "POCBNP" CBG: Recent Labs  Lab 05/01/22 0004 05/01/22 0426 05/01/22 0734 05/01/22 1114 05/01/22 1800  GLUCAP 190* 155* 142* 184* 200*   HbA1C: No results for input(s): "HGBA1C" in the last 72 hours.  Urine analysis:    Component Value Date/Time   COLORURINE YELLOW 04/29/2022 0803   APPEARANCEUR HAZY (A) 04/29/2022 0803   LABSPEC 1.017 04/29/2022 0803   PHURINE 5.0 04/29/2022 0803   GLUCOSEU NEGATIVE 04/29/2022 0803   HGBUR NEGATIVE 04/29/2022 0803   BILIRUBINUR NEGATIVE 04/29/2022 0803   KETONESUR 20 (A) 04/29/2022 0803   PROTEINUR NEGATIVE 04/29/2022 0803   UROBILINOGEN 1.0 06/12/2015 1021   NITRITE NEGATIVE 04/29/2022 0803   LEUKOCYTESUR NEGATIVE 04/29/2022 0803   Sepsis Labs:  Recent Results (from the past 240 hour(s))  Urine Culture     Status: Abnormal   Collection Time: 04/29/22  8:03 AM   Specimen: Urine, Clean Catch  Result Value Ref Range Status   Specimen Description   Final    URINE, CLEAN CATCH Performed at [facility], [address] [place], [place] [address]    Special Requests   Final    NONE Performed at [facility], [address] [place],  [place] [address]    Culture MULTIPLE SPECIES PRESENT, SUGGEST RECOLLECTION (A)  Final   Report Status 04/30/2022 FINAL  Final     Scheduled Meds:  apixaban  5 mg Oral BID   arformoterol  15 mcg Nebulization BID   atorvastatin  40 mg Oral Daily   benztropine  0.5 mg Oral QHS   budesonide (PULMICORT) nebulizer solution  0.5 mg Nebulization BID   dextromethorphan-guaiFENesin  1 tablet Oral BID   dorzolamide  1 drop Left Eye BID   doxycycline  100 mg Oral Q12H   gabapentin  100 mg Oral TID   ipratropium-albuterol  3 mL Nebulization TID   latanoprost  1 drop Both Eyes QHS   loxapine  10 mg Oral QHS   methylPREDNISolone (SOLU-MEDROL) injection  60 mg Intravenous Q12H   pantoprazole  40 mg Oral Daily   polyethylene glycol  17 g Oral Daily   revefenacin  175 mcg Nebulization  Daily   Continuous Infusions:  Procedures/Studies: DG Chest Port 1 View  Result Date: 04/29/2022 CLINICAL DATA:  Shortness of breath and hypoxemia. EXAM: PORTABLE CHEST 1 VIEW COMPARISON:  Portable chest 04/22/2022. FINDINGS: The lungs are emphysematous and clear of acute infiltrates. There is scattered linear scarring. Heart size and vasculature are normal with stable mediastinal silhouette. Mild aortic atherosclerosis. Osteopenia. IMPRESSION: Stable COPD chest with no acute cardiopulmonary findings. Electronically Signed   By: Telford Nab M.D.   On: 04/29/2022 03:14   DG Chest Port 1 View  Result Date: 04/22/2022 CLINICAL DATA:  Progressive shortness of breath. Personal history of COPD. EXAM: PORTABLE CHEST 1 VIEW COMPARISON:  One-view chest x-ray 04/12/2022 FINDINGS: Heart size is normal. Changes of COPD are noted. No edema or effusion is present. No focal airspace disease is present. IMPRESSION: 1. COPD. 2. No acute cardiopulmonary disease. Electronically Signed   By: [place] M.D.   On: 04/22/2022 14:06   DG Chest Port 1 View  Result Date: 04/12/2022 CLINICAL DATA:  Pain EXAM: PORTABLE CHEST 1 VIEW COMPARISON:  Same day chest x-ray FINDINGS: Stable cardiomediastinal contours. Chronic emphysematous lung changes. No new focal airspace consolidation, pleural effusion, or pneumothorax. Chronic posterior right sixth rib fracture. IMPRESSION: No active disease. Electronically Signed   By: Davina Poke D.O.   On: 04/12/2022 17:12   DG Chest Port 1 View  Result Date: 04/12/2022 CLINICAL DATA:  61 year old male with history of shortness of breath and chest pain. EXAM: PORTABLE CHEST 1 VIEW COMPARISON:  Chest x-ray 04/06/2022. FINDINGS: Lung volumes are normal. No consolidative airspace disease. No pleural effusions. No pneumothorax. No pulmonary nodule or mass noted. Pulmonary vasculature and the cardiomediastinal silhouette are within normal limits. Atherosclerosis in the thoracic  aorta. Old healed fracture of the posterolateral aspect of the right sixth rib incidentally noted. IMPRESSION: 1. No radiographic evidence of acute cardiopulmonary disease. 2. Aortic atherosclerosis. Electronically Signed   By: Vinnie Langton M.D.   On: 04/12/2022 06:14   DG Chest Portable 1 View  Result Date: 04/06/2022 CLINICAL DATA:  Evaluate for shortness of breath. EXAM: PORTABLE CHEST 1 VIEW COMPARISON:  04/01/2022 FINDINGS: Stable cardiomediastinal contours. The lungs are hyperinflated with interstitial changes of emphysema. No pleural effusion or edema identified. No airspace opacities. Unchanged right posterior fifth rib fracture. IMPRESSION: 1.  Emphysema (ICD10-J43.9). 2. No acute cardiopulmonary abnormalities. Electronically Signed   By: Kerby Moors M.D.   On: 04/06/2022 06:43    Barton Dubois, MD [facility]  If 7PM-7AM, please contact night-coverage [email or web address] Password [email or web address]  05/01/2022, 7:49 PM   LOS: 1 day

## 2022-05-01 NOTE — Progress Notes (Signed)
[facility]) Hospital Liaison note:  This patient is currently enrolled in [facility] outpatient-based Palliative Care. Will continue to follow for disposition.  Please call with any outpatient palliative questions or concerns.  Thank you, Lorelee Market, LPN [facility] Liaison [phone]

## 2022-05-02 DIAGNOSIS — E860 Dehydration: Secondary | ICD-10-CM

## 2022-05-02 DIAGNOSIS — J9621 Acute and chronic respiratory failure with hypoxia: Secondary | ICD-10-CM | POA: Diagnosis not present

## 2022-05-02 DIAGNOSIS — N179 Acute kidney failure, unspecified: Secondary | ICD-10-CM | POA: Diagnosis not present

## 2022-05-02 DIAGNOSIS — J441 Chronic obstructive pulmonary disease with (acute) exacerbation: Secondary | ICD-10-CM | POA: Diagnosis not present

## 2022-05-02 LAB — GLUCOSE, CAPILLARY
Glucose-Capillary: 114 mg/dL — ABNORMAL HIGH (ref 70–99)
Glucose-Capillary: 131 mg/dL — ABNORMAL HIGH (ref 70–99)
Glucose-Capillary: 143 mg/dL — ABNORMAL HIGH (ref 70–99)
Glucose-Capillary: 175 mg/dL — ABNORMAL HIGH (ref 70–99)

## 2022-05-02 LAB — BASIC METABOLIC PANEL
Anion gap: 7 (ref 5–15)
BUN: 26 mg/dL — ABNORMAL HIGH (ref 6–20)
CO2: 33 mmol/L — ABNORMAL HIGH (ref 22–32)
Calcium: 9.1 mg/dL (ref 8.9–10.3)
Chloride: 101 mmol/L (ref 98–111)
Creatinine, Ser: 0.85 mg/dL (ref 0.61–1.24)
GFR, Estimated: >60 mL/min (ref >60)
Glucose, Bld: 120 mg/dL — ABNORMAL HIGH (ref 70–99)
Potassium: 3.6 mmol/L (ref 3.5–5.1)
Sodium: 141 mmol/L (ref 135–145)

## 2022-05-02 LAB — MAGNESIUM: Magnesium: 2.1 mg/dL (ref 1.7–2.4)

## 2022-05-02 MED ORDER — DOXYCYCLINE HYCLATE 100 MG PO TABS: 100.0000 mg | ORAL_TABLET | Freq: Two times a day (BID) | ORAL | 0 refills | Status: DC

## 2022-05-02 MED ORDER — DM-GUAIFENESIN ER 30-600 MG PO TB12: 1.0000 | ORAL_TABLET | Freq: Two times a day (BID) | ORAL | 0 refills | Status: DC

## 2022-05-02 MED ORDER — PANTOPRAZOLE SODIUM 40 MG PO TBEC: 40.0000 mg | DELAYED_RELEASE_TABLET | Freq: Two times a day (BID) | ORAL | 3 refills | Status: DC

## 2022-05-02 MED ORDER — PREDNISONE 20 MG PO TABS: ORAL_TABLET | ORAL | 0 refills | Status: DC

## 2022-05-02 NOTE — TOC Progression Note (Addendum)
Transition of Care [facility]) - Progression Note    Patient Details  Name: YOVAN LEEMAN MRN: [id number] Date of Birth: May 27, 1961  Transition of Care [facility]) CM/SW Contact  Ihor Gully, LCSW Phone Number: 05/02/2022, 12:06 PM  Clinical Narrative:    Patient referred to [facility], [place], [place], [place], [place], [place], [facility] for HHPT and all have declined.  Patient advised. TOC attempted to offer OPPT and before TOC could complete the sentence "would you like" patient stated "no."   Expected Discharge Plan: Home/Self Care Barriers to Discharge: Continued Medical Work up  Expected Discharge Plan and Services Expected Discharge Plan: Home/Self Care In-house Referral: Clinical Social Work     Living arrangements for the past 2 months: Single Family Home                                       Social Determinants of Health (SDOH) Interventions    Readmission Risk Interventions    05/01/2022    7:51 AM 03/14/2022    3:54 PM 02/28/2022   10:40 AM  Readmission Risk Prevention Plan  Transportation Screening Complete Complete Complete  Medication Review Press photographer) Complete Complete Complete  PCP or Specialist appointment within 3-5 days of discharge  Not Complete   HRI or [place] Complete Complete Complete  SW Recovery Care/Counseling Consult Complete Complete Complete  Palliative Care Screening Not Applicable Not Applicable Not [place] Not Applicable Patient Refused Not Applicable

## 2022-05-02 NOTE — Discharge Summary (Signed)
Physician Discharge Summary   Patient: Tony Sullivan MRN: [id number] DOB: [birth year]  Admit date:     04/29/2022  Discharge date: 05/02/22  Discharge Physician: Barton Dubois   PCP: [name], FNP   Recommendations at discharge:  Repeat BMET to follow electrolytes and renal function. Reassess BP and adjust antihypertensive regimen as required. Outpatient follow with oncology service as an outpatient for final decisions/treatment regarding non-small cell lung cancer.  Discharge Diagnoses: Principal Problem:   Acute exacerbation of chronic obstructive pulmonary disease (COPD) ([place]) Active Problems:   Essential hypertension   GERD (gastroesophageal reflux disease)   Acute on chronic respiratory failure with hypoxia and hypercapnia (HCC)   Hyperkalemia   AKI (acute kidney injury) ([place])   Non-small cell lung cancer (HCC)   History of pulmonary embolus (PE)    Hospital Course: 61 year old male with a history of COPD, chronic respiratory failure on 4.5 L at home, anxiety/depression, hypertension, pulmonary embolus (03/13/2022 CTA chest) on apixaban, NSCLC diagnosed 03/22/2022 via bronchoscopy (Dr. Elsworth Soho) presenting with worsening shortness of breath over the past 2 to 3 days.  The patient states that he has not had any air conditioning at his apartment.  It has been over 90 degrees outside.  EMS was activated.  Upon their arrival they noted that the patient's oxygen was not disconnected.  The patient however reports that he does have a home health company supplies oxygen ([employer]), but he ran over his tubing causing the disconnection.  He endorses compliance with his medications.  He continues to smoke up until 1 week prior to this admission.  He denies any fevers, chills, chest pain, nausea, vomiting, diarrhea, abdominal pain.  He still has a small amount of hemoptysis intermittently. In the ED, the patient was afebrile and hemodynamically stable with oxygen saturation 100% 4 L.  WBC 15.7,  hemoglobin 13.3, platelets 297,000.  Sodium 142, potassium 5.4, bicarbonate 22, serum creatinine 1.46.  AST 58, ALT 30, alkaline phosphatase 133, total bilirubin 1.2.  The patient was given Solu-Medrol 125 mg, 2 L normal saline, and albuterol nebulizers in the emergency department.  He was admitted for further evaluation and treatment.   Notably, the patient was recently discharged to a skilled nursing facility on 04/14/2022, but he left the same day to go home.   Assessment and Plan: * Acute exacerbation of chronic obstructive pulmonary disease (COPD) ([place]) -Currently stable on 4-5 L (baseline for patient). -speaking in full sentences and stable for discharge. -mildly productive coughing spells appreciated.  -continue Flutter valve and mucolytic -complete oral antibiotics, steroids tapering and resume home PRN and maintenance inhaler management.  -outpatient follow up with pulmonologist.  History of pulmonary embolus (PE) -Continue apixaban -Diagnosed via CTA chest 03/14/2019  Non-small cell lung cancer ([place]) -Diagnosed via bronchoscopy 03/22/2022 -Patient has yet to follow-up with oncology to further determine treatment and management.  AKI (acute kidney injury) ([place]) -Baseline creatinine 0.8-1.1 -Presented with serum creatinine 1.46 -Secondary to volume depletion most likely  -Patient received 2 L normal saline in the emergency department -Volume staphylolysin within normal limits at discharge -Continue to follow volume stability -Low-sodium diet discussed with patient. -Creatinine at discharge 0.85. -Normal magnesium level.  Hyperkalemia -improved/resolved. -Repeat basic metabolic panel at follow-up visit in order to reassess electrolytes trend/stability.  Acute on chronic respiratory failure with hypoxia and hypercapnia (HCC) -Secondary to COPD exacerbation -Noted to have oxygen saturation of 70% on room air -Currently stable on 4-5 L (baseline) -Continue steroids tapering,  rescue/maintenance bronchodilators and oral  antibiotics. -patient to follow up with pulmonology service as an outpatient.  GERD (gastroesophageal reflux disease) -Continue PPI  Essential hypertension -Not on any agents prior to admission -BP stable and controlled -Heart healthy diet discussed with patient.   Consultants: None Procedures performed: See below for x-ray reports. Disposition: Home Diet recommendation: Heart healthy/low-sodium diet.  DISCHARGE MEDICATION: Allergies as of 05/02/2022       Reactions   Penicillins Anaphylaxis   Has patient had a PCN reaction causing immediate rash, facial/tongue/throat swelling, SOB or lightheadedness with hypotension: Yes Has patient had a PCN reaction causing severe rash involving mucus membranes or skin necrosis: No Has patient had a PCN reaction that required hospitalization No Has patient had a PCN reaction occurring within the last 10 years: No If all of the above answers are "NO", then may proceed with Cephalosporin use.   Levocetirizine Other (See Comments)   Muscle cramps        Medication List     STOP taking these medications    guaiFENesin 200 MG tablet       TAKE these medications    acetaminophen 325 MG tablet Commonly known as: TYLENOL Take 2 tablets (650 mg total) by mouth every 6 (six) hours as needed for mild pain (or Fever >/= 101).   albuterol (2.5 MG/3ML) 0.083% nebulizer solution Commonly known as: PROVENTIL Take 3 mLs (2.5 mg total) by nebulization every 4 (four) hours as needed for wheezing or shortness of breath.   albuterol 108 (90 Base) MCG/ACT inhaler Commonly known as: VENTOLIN HFA Inhale 2 puffs into the lungs every 6 (six) hours as needed for wheezing or shortness of breath.   apixaban 5 MG Tabs tablet Commonly known as: ELIQUIS Take 1 tablet (5 mg total) by mouth 2 (two) times daily.   atorvastatin 40 MG tablet Commonly known as: LIPITOR TAKE ONE TABLET BY MOUTH DAILY    benztropine 0.5 MG tablet Commonly known as: COGENTIN Take 0.5 mg by mouth at bedtime.   Breztri Aerosphere 160-9-4.8 MCG/ACT Aero Generic drug: Budeson-Glycopyrrol-Formoterol Inhale 2 puffs into the lungs in the morning and at bedtime.   dextromethorphan-guaiFENesin 30-600 MG 12hr tablet Commonly known as: MUCINEX DM Take 1 tablet by mouth 2 (two) times daily.   diclofenac Sodium 1 % Gel Commonly known as: VOLTAREN Apply 2 g topically 4 (four) times daily as needed (pain).   dorzolamide 2 % ophthalmic solution Commonly known as: TRUSOPT Place 1 drop into the left eye 2 (two) times daily.   doxycycline 100 MG tablet Commonly known as: VIBRA-TABS Take 1 tablet (100 mg total) by mouth every 12 (twelve) hours for 4 days.   furosemide 20 MG tablet Commonly known as: LASIX Take 1 tablet (20 mg total) by mouth daily.   gabapentin 100 MG capsule Commonly known as: NEURONTIN Take 1 capsule (100 mg total) by mouth 3 (three) times daily.   hydrOXYzine 25 MG tablet Commonly known as: ATARAX Take 1 tablet (25 mg total) by mouth 3 (three) times daily as needed for anxiety.   latanoprost 0.005 % ophthalmic solution Commonly known as: XALATAN Place 1 drop into both eyes at bedtime.   loxapine 10 MG capsule Commonly known as: LOXITANE Take 10 mg by mouth at bedtime.   mirtazapine 30 MG tablet Commonly known as: REMERON Take 1 tablet (30 mg total) by mouth at bedtime.   multivitamin with minerals Tabs tablet Take 1 tablet by mouth daily.   OXYGEN Inhale 4-5 L into the lungs as directed.  4-5 liters with sleep and exertion as needed for low oxygen   pantoprazole 40 MG tablet Commonly known as: PROTONIX Take 1 tablet (40 mg total) by mouth 2 (two) times daily. What changed: when to take this   polyethylene glycol 17 g packet Commonly known as: MIRALAX / [place] What changed: See the new instructions.   potassium chloride SA 20 MEQ  tablet Commonly known as: KLOR-CON M Take 1 tablet (20 mEq total) by mouth daily.   predniSONE 20 MG tablet Commonly known as: DELTASONE Take 3 tablets by mouth daily x1 day; then 2 tablets by mouth daily x2-day; then 1 tablet by mouth daily x3 days; and then half tablet by mouth daily x3 days and stop prednisone.   senna-docusate 8.6-50 MG tablet Commonly known as: Senokot-S Take 2 tablets by mouth at bedtime. What changed:  when to take this reasons to take this   simethicone 80 MG chewable tablet Commonly known as: MYLICON Chew 1 tablet (80 mg total) by mouth 4 (four) times daily as needed for flatulence. What changed: when to take this        Discharge Exam: Filed Weights   04/29/22 0800  Weight: 60.5 kg   General exam: Alert, awake, oriented x 3; able to speak in full sentences; no chest pain, no nausea, no vomiting.  Good saturation on chronic 4-5 L nasal cannula supplementation.  Patient is afebrile.  No overnight events. Respiratory system: Improved air movement bilaterally; positive scattered rhonchi; no wheezing, no using accessory muscles. Cardiovascular system:RRR. No rubs, no gallops.  No JVD. Gastrointestinal system: Abdomen is nondistended, soft and nontender. No organomegaly or masses felt. Normal bowel sounds heard. Central nervous system: Alert and oriented. No focal neurological deficits. Extremities: No cyanosis or clubbing; trace edema appreciated bilaterally. Skin: No petechiae. Psychiatry: Judgement and insight appear normal. Mood & affect appropriate.    Condition at discharge: Stable and improved.  The results of significant diagnostics from this hospitalization (including imaging, microbiology, ancillary and laboratory) are listed below for reference.   Imaging Studies: DG Chest Port 1 View  Result Date: 04/29/2022 CLINICAL DATA:  Shortness of breath and hypoxemia. EXAM: PORTABLE CHEST 1 VIEW COMPARISON:  Portable chest 04/22/2022. FINDINGS: The  lungs are emphysematous and clear of acute infiltrates. There is scattered linear scarring. Heart size and vasculature are normal with stable mediastinal silhouette. Mild aortic atherosclerosis. Osteopenia. IMPRESSION: Stable COPD chest with no acute cardiopulmonary findings. Electronically Signed   By: Telford Nab M.D.   On: 04/29/2022 03:14   DG Chest Port 1 View  Result Date: 04/22/2022 CLINICAL DATA:  Progressive shortness of breath. Personal history of COPD. EXAM: PORTABLE CHEST 1 VIEW COMPARISON:  One-view chest x-ray 04/12/2022 FINDINGS: Heart size is normal. Changes of COPD are noted. No edema or effusion is present. No focal airspace disease is present. IMPRESSION: 1. COPD. 2. No acute cardiopulmonary disease. Electronically Signed   By: San Morelle M.D.   On: 04/22/2022 14:06   DG Chest Port 1 View  Result Date: 04/12/2022 CLINICAL DATA:  Pain EXAM: PORTABLE CHEST 1 VIEW COMPARISON:  Same day chest x-ray FINDINGS: Stable cardiomediastinal contours. Chronic emphysematous lung changes. No new focal airspace consolidation, pleural effusion, or pneumothorax. Chronic posterior right sixth rib fracture. IMPRESSION: No active disease. Electronically Signed   By: Davina Poke D.O.   On: 04/12/2022 17:12   DG Chest Port 1 View  Result Date: 04/12/2022 CLINICAL DATA:  61 year old male with history  of shortness of breath and chest pain. EXAM: PORTABLE CHEST 1 VIEW COMPARISON:  Chest x-ray 04/06/2022. FINDINGS: Lung volumes are normal. No consolidative airspace disease. No pleural effusions. No pneumothorax. No pulmonary nodule or mass noted. Pulmonary vasculature and the cardiomediastinal silhouette are within normal limits. Atherosclerosis in the thoracic aorta. Old healed fracture of the posterolateral aspect of the right sixth rib incidentally noted. IMPRESSION: 1. No radiographic evidence of acute cardiopulmonary disease. 2. Aortic atherosclerosis. Electronically Signed   By: Vinnie Langton M.D.   On: 04/12/2022 06:14   DG Chest Portable 1 View  Result Date: 04/06/2022 CLINICAL DATA:  Evaluate for shortness of breath. EXAM: PORTABLE CHEST 1 VIEW COMPARISON:  04/01/2022 FINDINGS: Stable cardiomediastinal contours. The lungs are hyperinflated with interstitial changes of emphysema. No pleural effusion or edema identified. No airspace opacities. Unchanged right posterior fifth rib fracture. IMPRESSION: 1.  Emphysema (ICD10-J43.9). 2. No acute cardiopulmonary abnormalities. Electronically Signed   By: Kerby Moors M.D.   On: 04/06/2022 06:43    Microbiology: Results for orders placed or performed during the hospital encounter of 04/29/22  Urine Culture     Status: Abnormal   Collection Time: 04/29/22  8:03 AM   Specimen: Urine, Clean Catch  Result Value Ref Range Status   Specimen Description   Final    URINE, CLEAN CATCH Performed at [facility], [address], [place], [place] [address]    Special Requests   Final    NONE Performed at [facility], [address] [place], [place] [address]    Culture MULTIPLE SPECIES PRESENT, SUGGEST RECOLLECTION (A)  Final   Report Status 04/30/2022 FINAL  Final    Labs: CBC: Recent Labs  Lab 04/29/22 0515 04/30/22 1544  WBC 15.7* 11.4*  NEUTROABS 14.1*  --   HGB 13.3 10.3*  HCT 41.6 34.1*  MCV 102.2* 105.6*  PLT 297 094   Basic Metabolic Panel: Recent Labs  Lab 04/29/22 0411 04/30/22 1451 05/02/22 0456  NA 142 143 141  K 5.4* 4.1 3.6  CL 105 105 101  CO2 22 31 33*  GLUCOSE 69* 189* 120*  BUN 62* 30* 26*  CREATININE 1.46* 0.97 0.85  CALCIUM 8.7* 8.7* 9.1  MG  --  2.4 2.1   Liver Function Tests: Recent Labs  Lab 04/29/22 0411  AST 58*  ALT 38  ALKPHOS 133*  BILITOT 1.2  PROT 6.0*  ALBUMIN 2.8*   CBG: Recent Labs  Lab 05/01/22 1114 05/01/22 1800 05/01/22 2350 05/02/22 0514 05/02/22 1107  GLUCAP 184* 200* 169* 143* 175*    Discharge time spent: greater than 30 minutes.  Signed: Barton Dubois, MD [facility] 05/02/2022

## 2022-05-02 NOTE — Progress Notes (Signed)
Nsg Discharge Note  Admit Date:  04/29/2022 Discharge date: 05/02/2022   Tony Sullivan to be D/C'd Home per MD order.  AVS completed.  Copy for chart, and copy for patient signed, and dated. Patient/caregiver able to verbalize understanding.  Discharge Medication: Allergies as of 05/02/2022       Reactions   Penicillins Anaphylaxis   Has patient had a PCN reaction causing immediate rash, facial/tongue/throat swelling, SOB or lightheadedness with hypotension: Yes Has patient had a PCN reaction causing severe rash involving mucus membranes or skin necrosis: No Has patient had a PCN reaction that required hospitalization No Has patient had a PCN reaction occurring within the last 10 years: No If all of the above answers are "NO", then may proceed with Cephalosporin use.   Levocetirizine Other (See Comments)   Muscle cramps        Medication List     STOP taking these medications    guaiFENesin 200 MG tablet       TAKE these medications    acetaminophen 325 MG tablet Commonly known as: TYLENOL Take 2 tablets (650 mg total) by mouth every 6 (six) hours as needed for mild pain (or Fever >/= 101).   albuterol (2.5 MG/3ML) 0.083% nebulizer solution Commonly known as: PROVENTIL Take 3 mLs (2.5 mg total) by nebulization every 4 (four) hours as needed for wheezing or shortness of breath.   albuterol 108 (90 Base) MCG/ACT inhaler Commonly known as: VENTOLIN HFA Inhale 2 puffs into the lungs every 6 (six) hours as needed for wheezing or shortness of breath.   apixaban 5 MG Tabs tablet Commonly known as: ELIQUIS Take 1 tablet (5 mg total) by mouth 2 (two) times daily.   atorvastatin 40 MG tablet Commonly known as: LIPITOR TAKE ONE TABLET BY MOUTH DAILY   benztropine 0.5 MG tablet Commonly known as: COGENTIN Take 0.5 mg by mouth at bedtime.   Breztri Aerosphere 160-9-4.8 MCG/ACT Aero Generic drug: Budeson-Glycopyrrol-Formoterol Inhale 2 puffs into the lungs in the morning  and at bedtime.   dextromethorphan-guaiFENesin 30-600 MG 12hr tablet Commonly known as: MUCINEX DM Take 1 tablet by mouth 2 (two) times daily.   diclofenac Sodium 1 % Gel Commonly known as: VOLTAREN Apply 2 g topically 4 (four) times daily as needed (pain).   dorzolamide 2 % ophthalmic solution Commonly known as: TRUSOPT Place 1 drop into the left eye 2 (two) times daily.   doxycycline 100 MG tablet Commonly known as: VIBRA-TABS Take 1 tablet (100 mg total) by mouth every 12 (twelve) hours for 4 days.   furosemide 20 MG tablet Commonly known as: LASIX Take 1 tablet (20 mg total) by mouth daily.   gabapentin 100 MG capsule Commonly known as: NEURONTIN Take 1 capsule (100 mg total) by mouth 3 (three) times daily.   hydrOXYzine 25 MG tablet Commonly known as: ATARAX Take 1 tablet (25 mg total) by mouth 3 (three) times daily as needed for anxiety.   latanoprost 0.005 % ophthalmic solution Commonly known as: XALATAN Place 1 drop into both eyes at bedtime.   loxapine 10 MG capsule Commonly known as: LOXITANE Take 10 mg by mouth at bedtime.   mirtazapine 30 MG tablet Commonly known as: REMERON Take 1 tablet (30 mg total) by mouth at bedtime.   multivitamin with minerals Tabs tablet Take 1 tablet by mouth daily.   OXYGEN Inhale 4-5 L into the lungs as directed. 4-5 liters with sleep and exertion as needed for low oxygen   pantoprazole  40 MG tablet Commonly known as: PROTONIX Take 1 tablet (40 mg total) by mouth 2 (two) times daily. What changed: when to take this   polyethylene glycol 17 g packet Commonly known as: MIRALAX / [place] What changed: See the new instructions.   potassium chloride SA 20 MEQ tablet Commonly known as: KLOR-CON M Take 1 tablet (20 mEq total) by mouth daily.   predniSONE 20 MG tablet Commonly known as: DELTASONE Take 3 tablets by mouth daily x1 day; then 2 tablets by mouth daily x2-day; then 1 tablet by  mouth daily x3 days; and then half tablet by mouth daily x3 days and stop prednisone.   senna-docusate 8.6-50 MG tablet Commonly known as: Senokot-S Take 2 tablets by mouth at bedtime. What changed:  when to take this reasons to take this   simethicone 80 MG chewable tablet Commonly known as: MYLICON Chew 1 tablet (80 mg total) by mouth 4 (four) times daily as needed for flatulence. What changed: when to take this        Discharge Assessment: Vitals:   05/02/22 1404 05/02/22 1445  BP: (!) 150/104   Pulse: 87   Resp: (!) 21   Temp: 98.2 F (36.8 C)   SpO2: 100% 100%   Skin clean, dry and intact without evidence of skin break down, no evidence of skin tears noted. IV catheter discontinued intact. Site without signs and symptoms of complications - no redness or edema noted at insertion site, patient denies c/o pain - only slight tenderness at site.  Dressing with slight pressure applied.  D/c Instructions-Education: Discharge instructions given to patient/family with verbalized understanding. D/c education completed with patient/family including follow up instructions, medication list, d/c activities limitations if indicated, with other d/c instructions as indicated by MD - patient able to verbalize understanding, all questions fully answered. Patient instructed to return to ED, call 911, or call MD for any changes in condition.  Patient escorted via [place], and D/C home via private auto.  Dorcas Mcmurray, LPN 10/31/1028 1:31 PM

## 2022-05-03 ENCOUNTER — Ambulatory Visit (HOSPITAL_COMMUNITY): Admission: RE | Admit: 2022-05-03 | Payer: Medicaid Other | Source: Ambulatory Visit

## 2022-05-03 NOTE — Progress Notes (Signed)
Patient VSS. Patient has no IV. Patient's belongings sent with EMS. Patient discharge home with EMS. Paperwork with patient day shift went over with patient.

## 2022-05-06 ENCOUNTER — Other Ambulatory Visit: Payer: Self-pay

## 2022-05-06 ENCOUNTER — Emergency Department (HOSPITAL_COMMUNITY): Payer: Medicaid Other

## 2022-05-06 ENCOUNTER — Encounter (HOSPITAL_COMMUNITY): Payer: Self-pay | Admitting: Family Medicine

## 2022-05-06 ENCOUNTER — Inpatient Hospital Stay (HOSPITAL_COMMUNITY)
Admission: EM | Admit: 2022-05-06 | Discharge: 2022-05-09 | DRG: 190 | Disposition: A | Discharge status: Home / Self Care | Payer: Medicaid Other | Attending: Family Medicine | Admitting: Family Medicine

## 2022-05-06 DIAGNOSIS — R059 Cough, unspecified: Secondary | ICD-10-CM | POA: Diagnosis present

## 2022-05-06 DIAGNOSIS — Z888 Allergy status to other drugs, medicaments and biological substances status: Secondary | ICD-10-CM | POA: Diagnosis not present

## 2022-05-06 DIAGNOSIS — C3402 Malignant neoplasm of left main bronchus: Secondary | ICD-10-CM | POA: Diagnosis present

## 2022-05-06 DIAGNOSIS — Z720 Tobacco use: Secondary | ICD-10-CM | POA: Diagnosis not present

## 2022-05-06 DIAGNOSIS — R627 Adult failure to thrive: Secondary | ICD-10-CM | POA: Diagnosis present

## 2022-05-06 DIAGNOSIS — L89151 Pressure ulcer of sacral region, stage 1: Secondary | ICD-10-CM | POA: Diagnosis present

## 2022-05-06 DIAGNOSIS — I10 Essential (primary) hypertension: Secondary | ICD-10-CM | POA: Diagnosis present

## 2022-05-06 DIAGNOSIS — L89142 Pressure ulcer of left lower back, stage 2: Secondary | ICD-10-CM | POA: Diagnosis present

## 2022-05-06 DIAGNOSIS — Z880 Allergy status to penicillin: Secondary | ICD-10-CM

## 2022-05-06 DIAGNOSIS — R0602 Shortness of breath: Secondary | ICD-10-CM | POA: Diagnosis present

## 2022-05-06 DIAGNOSIS — Z86711 Personal history of pulmonary embolism: Secondary | ICD-10-CM

## 2022-05-06 DIAGNOSIS — Z79899 Other long term (current) drug therapy: Secondary | ICD-10-CM | POA: Diagnosis not present

## 2022-05-06 DIAGNOSIS — K219 Gastro-esophageal reflux disease without esophagitis: Secondary | ICD-10-CM | POA: Diagnosis not present

## 2022-05-06 DIAGNOSIS — J441 Chronic obstructive pulmonary disease with (acute) exacerbation: Secondary | ICD-10-CM | POA: Diagnosis present

## 2022-05-06 DIAGNOSIS — F319 Bipolar disorder, unspecified: Secondary | ICD-10-CM | POA: Diagnosis present

## 2022-05-06 DIAGNOSIS — Z7951 Long term (current) use of inhaled steroids: Secondary | ICD-10-CM | POA: Diagnosis not present

## 2022-05-06 DIAGNOSIS — C3490 Malignant neoplasm of unspecified part of unspecified bronchus or lung: Secondary | ICD-10-CM | POA: Diagnosis present

## 2022-05-06 DIAGNOSIS — Z832 Family history of diseases of the blood and blood-forming organs and certain disorders involving the immune mechanism: Secondary | ICD-10-CM

## 2022-05-06 DIAGNOSIS — Z981 Arthrodesis status: Secondary | ICD-10-CM

## 2022-05-06 DIAGNOSIS — T148XXA Other injury of unspecified body region, initial encounter: Secondary | ICD-10-CM | POA: Diagnosis present

## 2022-05-06 DIAGNOSIS — L89132 Pressure ulcer of right lower back, stage 2: Secondary | ICD-10-CM | POA: Diagnosis present

## 2022-05-06 DIAGNOSIS — I2694 Multiple subsegmental pulmonary emboli without acute cor pulmonale: Secondary | ICD-10-CM | POA: Diagnosis not present

## 2022-05-06 DIAGNOSIS — F17210 Nicotine dependence, cigarettes, uncomplicated: Secondary | ICD-10-CM | POA: Diagnosis present

## 2022-05-06 DIAGNOSIS — M1990 Unspecified osteoarthritis, unspecified site: Secondary | ICD-10-CM | POA: Diagnosis present

## 2022-05-06 DIAGNOSIS — F419 Anxiety disorder, unspecified: Secondary | ICD-10-CM | POA: Diagnosis present

## 2022-05-06 DIAGNOSIS — L8990 Pressure ulcer of unspecified site, unspecified stage: Secondary | ICD-10-CM | POA: Insufficient documentation

## 2022-05-06 DIAGNOSIS — L89892 Pressure ulcer of other site, stage 2: Secondary | ICD-10-CM | POA: Diagnosis present

## 2022-05-06 DIAGNOSIS — I2699 Other pulmonary embolism without acute cor pulmonale: Secondary | ICD-10-CM | POA: Diagnosis present

## 2022-05-06 DIAGNOSIS — Z7901 Long term (current) use of anticoagulants: Secondary | ICD-10-CM | POA: Diagnosis not present

## 2022-05-06 DIAGNOSIS — D6869 Other thrombophilia: Secondary | ICD-10-CM | POA: Diagnosis not present

## 2022-05-06 DIAGNOSIS — J9621 Acute and chronic respiratory failure with hypoxia: Secondary | ICD-10-CM | POA: Diagnosis present

## 2022-05-06 DIAGNOSIS — Z9981 Dependence on supplemental oxygen: Secondary | ICD-10-CM | POA: Diagnosis not present

## 2022-05-06 DIAGNOSIS — Z8249 Family history of ischemic heart disease and other diseases of the circulatory system: Secondary | ICD-10-CM

## 2022-05-06 DIAGNOSIS — J9622 Acute and chronic respiratory failure with hypercapnia: Secondary | ICD-10-CM | POA: Diagnosis present

## 2022-05-06 DIAGNOSIS — R636 Underweight: Secondary | ICD-10-CM | POA: Diagnosis present

## 2022-05-06 DIAGNOSIS — Z681 Body mass index (BMI) 19 or less, adult: Secondary | ICD-10-CM | POA: Diagnosis not present

## 2022-05-06 DIAGNOSIS — Z20822 Contact with and (suspected) exposure to covid-19: Secondary | ICD-10-CM | POA: Diagnosis present

## 2022-05-06 DIAGNOSIS — C7A1 Malignant poorly differentiated neuroendocrine tumors: Secondary | ICD-10-CM

## 2022-05-06 DIAGNOSIS — D6859 Other primary thrombophilia: Secondary | ICD-10-CM | POA: Diagnosis present

## 2022-05-06 DIAGNOSIS — R7303 Prediabetes: Secondary | ICD-10-CM | POA: Diagnosis present

## 2022-05-06 DIAGNOSIS — Z91199 Patient's noncompliance with other medical treatment and regimen due to unspecified reason: Secondary | ICD-10-CM

## 2022-05-06 DIAGNOSIS — Z809 Family history of malignant neoplasm, unspecified: Secondary | ICD-10-CM

## 2022-05-06 DIAGNOSIS — Z825 Family history of asthma and other chronic lower respiratory diseases: Secondary | ICD-10-CM

## 2022-05-06 DIAGNOSIS — Z833 Family history of diabetes mellitus: Secondary | ICD-10-CM

## 2022-05-06 LAB — BASIC METABOLIC PANEL
Anion gap: 9 (ref 5–15)
BUN: 24 mg/dL — ABNORMAL HIGH (ref 6–20)
CO2: 30 mmol/L (ref 22–32)
Calcium: 9 mg/dL (ref 8.9–10.3)
Chloride: 102 mmol/L (ref 98–111)
Creatinine, Ser: 0.66 mg/dL (ref 0.61–1.24)
GFR, Estimated: >60 mL/min (ref >60)
Glucose, Bld: 94 mg/dL (ref 70–99)
Potassium: 3.5 mmol/L (ref 3.5–5.1)
Sodium: 141 mmol/L (ref 135–145)

## 2022-05-06 LAB — CBC WITH DIFFERENTIAL/PLATELET
Abs Immature Granulocytes: 0.2 10*3/uL — ABNORMAL HIGH (ref 0.00–0.07)
Basophils Absolute: 0.1 10*3/uL (ref 0.0–0.1)
Basophils Relative: 1 %
Eosinophils Absolute: 0 10*3/uL (ref 0.0–0.5)
Eosinophils Relative: 0 %
HCT: 36.7 % — ABNORMAL LOW (ref 39.0–52.0)
Hemoglobin: 11 g/dL — ABNORMAL LOW (ref 13.0–17.0)
Immature Granulocytes: 2 %
Lymphocytes Relative: 10 %
Lymphs Abs: 0.9 10*3/uL (ref 0.7–4.0)
MCH: 31.9 pg (ref 26.0–34.0)
MCHC: 30 g/dL (ref 30.0–36.0)
MCV: 106.4 fL — ABNORMAL HIGH (ref 80.0–100.0)
Monocytes Absolute: 1.2 10*3/uL — ABNORMAL HIGH (ref 0.1–1.0)
Monocytes Relative: 13 %
Neutro Abs: 7.1 10*3/uL (ref 1.7–7.7)
Neutrophils Relative %: 74 %
Platelets: 200 10*3/uL (ref 150–400)
RBC: 3.45 MIL/uL — ABNORMAL LOW (ref 4.22–5.81)
RDW: 16.5 % — ABNORMAL HIGH (ref 11.5–15.5)
WBC: 9.5 10*3/uL (ref 4.0–10.5)
nRBC: 0 % (ref 0.0–0.2)

## 2022-05-06 LAB — BLOOD GAS, ARTERIAL
Acid-Base Excess: 12 mmol/L — ABNORMAL HIGH (ref 0.0–2.0)
Bicarbonate: 39.3 mmol/L — ABNORMAL HIGH (ref 20.0–28.0)
Drawn by: 38235
FIO2: 40 %
O2 Saturation: 95 %
Patient temperature: 37
pCO2 arterial: 62 mmHg — ABNORMAL HIGH (ref 32–48)
pH, Arterial: 7.41 (ref 7.35–7.45)
pO2, Arterial: 68 mmHg — ABNORMAL LOW (ref 83–108)

## 2022-05-06 LAB — TROPONIN I (HIGH SENSITIVITY)
Troponin I (High Sensitivity): 17 ng/L (ref <18)
Troponin I (High Sensitivity): 18 ng/L — ABNORMAL HIGH (ref <18)

## 2022-05-06 LAB — BRAIN NATRIURETIC PEPTIDE: B Natriuretic Peptide: 66 pg/mL (ref 0.0–100.0)

## 2022-05-06 LAB — SARS CORONAVIRUS 2 BY RT PCR: SARS Coronavirus 2 by RT PCR: NEGATIVE

## 2022-05-06 MED ORDER — PROCHLORPERAZINE EDISYLATE 10 MG/2ML IJ SOLN
10.0000 mg | INTRAMUSCULAR | Status: DC | PRN
Start: 2022-05-06 — End: 2022-05-09

## 2022-05-06 MED ORDER — ATORVASTATIN CALCIUM 40 MG PO TABS
40.0000 mg | ORAL_TABLET | Freq: Every evening | ORAL | Status: DC
  Administered 2022-05-06 – 2022-05-08 (×3): 40 mg via ORAL
  Filled 2022-05-06 (×3): qty 1

## 2022-05-06 MED ORDER — ACETAMINOPHEN 325 MG PO TABS: 650.0000 mg | ORAL_TABLET | Freq: Four times a day (QID) | ORAL | Status: DC | PRN

## 2022-05-06 MED ORDER — ACETAMINOPHEN 650 MG RE SUPP: 650.0000 mg | Freq: Four times a day (QID) | RECTAL | Status: DC | PRN

## 2022-05-06 MED ORDER — METHYLPREDNISOLONE SODIUM SUCC 125 MG IJ SOLR
125.0000 mg | Freq: Once | INTRAMUSCULAR | Status: AC
Start: 2022-05-06 — End: 2022-05-06
  Administered 2022-05-06: 125 mg via INTRAVENOUS
  Filled 2022-05-06: qty 2

## 2022-05-06 MED ORDER — SODIUM CHLORIDE 0.9 % IV SOLN
100.0000 mg | Freq: Two times a day (BID) | INTRAVENOUS | Status: DC
  Administered 2022-05-06 – 2022-05-07 (×3): 100 mg via INTRAVENOUS
  Filled 2022-05-06 (×5): qty 100

## 2022-05-06 MED ORDER — OXYCODONE HCL 5 MG PO TABS
5.0000 mg | ORAL_TABLET | ORAL | Status: DC | PRN
  Administered 2022-05-06 – 2022-05-08 (×4): 5 mg via ORAL
  Filled 2022-05-06 (×5): qty 1

## 2022-05-06 MED ORDER — ALBUTEROL SULFATE (2.5 MG/3ML) 0.083% IN NEBU
INHALATION_SOLUTION | RESPIRATORY_TRACT | Status: AC
  Administered 2022-05-06: 2.5 mg
  Filled 2022-05-06: qty 3

## 2022-05-06 MED ORDER — IPRATROPIUM-ALBUTEROL 0.5-2.5 (3) MG/3ML IN SOLN
3.0000 mL | Freq: Four times a day (QID) | RESPIRATORY_TRACT | Status: DC
  Administered 2022-05-06 – 2022-05-09 (×11): 3 mL via RESPIRATORY_TRACT
  Filled 2022-05-06 (×10): qty 3

## 2022-05-06 MED ORDER — ENOXAPARIN SODIUM 40 MG/0.4ML IJ SOSY
40.0000 mg | PREFILLED_SYRINGE | INTRAMUSCULAR | Status: DC
  Administered 2022-05-06 – 2022-05-08 (×3): 40 mg via SUBCUTANEOUS
  Filled 2022-05-06 (×3): qty 0.4

## 2022-05-06 MED ORDER — FENTANYL CITRATE PF 50 MCG/ML IJ SOSY
12.5000 ug | PREFILLED_SYRINGE | INTRAMUSCULAR | Status: DC | PRN
  Administered 2022-05-06 – 2022-05-07 (×2): 12.5 ug via INTRAVENOUS
  Filled 2022-05-06 (×2): qty 1

## 2022-05-06 MED ORDER — HYDROXYZINE HCL 25 MG PO TABS
25.0000 mg | ORAL_TABLET | Freq: Three times a day (TID) | ORAL | Status: DC | PRN
Start: 2022-05-06 — End: 2022-05-09
  Administered 2022-05-06 – 2022-05-09 (×3): 25 mg via ORAL
  Filled 2022-05-06 (×4): qty 1

## 2022-05-06 MED ORDER — METHYLPREDNISOLONE SODIUM SUCC 125 MG IJ SOLR
60.0000 mg | Freq: Two times a day (BID) | INTRAMUSCULAR | Status: DC
  Administered 2022-05-06 – 2022-05-09 (×6): 60 mg via INTRAVENOUS
  Filled 2022-05-06 (×6): qty 2

## 2022-05-06 MED ORDER — IPRATROPIUM-ALBUTEROL 0.5-2.5 (3) MG/3ML IN SOLN
RESPIRATORY_TRACT | Status: AC
  Administered 2022-05-06: 3 mL
  Filled 2022-05-06: qty 3

## 2022-05-06 MED ORDER — SILVER SULFADIAZINE 1 % EX CREA
TOPICAL_CREAM | Freq: Two times a day (BID) | CUTANEOUS | Status: DC
  Administered 2022-05-08: 1 via TOPICAL
  Filled 2022-05-06 (×2): qty 85

## 2022-05-06 MED ORDER — ORAL CARE MOUTH RINSE
15.0000 mL | OROMUCOSAL | Status: DC
  Administered 2022-05-06 – 2022-05-08 (×5): 15 mL via OROMUCOSAL

## 2022-05-06 MED ORDER — CHLORHEXIDINE GLUCONATE CLOTH 2 % EX PADS
6.0000 | MEDICATED_PAD | Freq: Every day | CUTANEOUS | Status: DC
  Administered 2022-05-06: 6 via TOPICAL

## 2022-05-06 MED ORDER — ORAL CARE MOUTH RINSE: 15.0000 mL | OROMUCOSAL | Status: DC | PRN

## 2022-05-06 MED ORDER — SODIUM CHLORIDE 0.9 % IV SOLN: INTRAVENOUS | Status: AC

## 2022-05-06 MED ORDER — BISACODYL 5 MG PO TBEC
5.0000 mg | DELAYED_RELEASE_TABLET | Freq: Every day | ORAL | Status: DC | PRN
  Administered 2022-05-07: 5 mg via ORAL
  Filled 2022-05-06: qty 1

## 2022-05-06 NOTE — Hospital Course (Signed)
61 year old gentleman longtime smoker with oxygen dependent COPD, chronic respiratory failure on 5 L nasal cannula, home NIV that he is not using regularly, failure to thrive, malnutrition, recently diagnosed NSCLC 03/22/2022 via bronchoscopy, pulmonary embolus diagnosed 03/13/2022 on apixaban, poor compliance, poor self-care and refusal to stay at SNF presented to the emergency department after recently being discharged with similar symptoms of severe shortness of breath, hypoxia, cough, productive sputum and somnolence.  He lives in an apartment that is not air conditioned and the extreme heat and humidity continues to exacerbate his COPD.  He also is notably noncompliant with using his home NIV despite home visits by respiratory therapy care.  Attempts to define goals of care have been unsuccessful in the past.  His care has been difficult to manage due to him not being cooperative and not participating in care.  EMS found him to be extremely hypoxic with a pulse ox in the 80% range while on supplemental nasal cannula oxygen.  He has been placed on BiPAP therapy.  His ABG revealed a PO2 of 68 with a PCO2 of 62 and pH of 7.41.  His SARS 2 coronavirus test was negative.  He is admitted with acute on chronic respiratory failure.

## 2022-05-06 NOTE — Assessment & Plan Note (Signed)
-   Continue IV steroids and IV antibiotics and scheduled bronchodilators -- continue nightly bipap (TOC says he has an NIV at his home to use)

## 2022-05-06 NOTE — Consult Note (Addendum)
WOC Nurse Consult Note: Reason for Consult: blisters to right foot and to left LE.  Admitting MD indicates that thermal injuries may have been due to patient placing feet into hot water. Wound type:Thermal, partial thickness Pressure Injury POA: N/A Measurement:Bedside RN to measure wounds and document measurements on Nursing Flow Sheet today with first dressing change. Wound GBE:EFEO, moist Drainage (amount, consistency, odor) small serous Periwound: intact, edema, erythema Dressing procedure/placement/frequency: I have provided Nursing with guidance for the topical care of these lesions using a twice daily application of silver sulfadiazine cream applied in a 1/8 inch layer after cleansing with NS, and gently patting dry. The Silvadene cream is to be topped with saline dampened gauze and covered with an ABD pad for comfort. This is to be secured with Kerlix roll gauze and paper tape.  Patient is to be turned and repositioned in bed with care to minimize time in the supine position and heels floated. The sacrum is to be covered with a silicone foam bordered dressing for PI prophylaxis.  The POC is communicated to Bedside RN, Sharion Balloon.  [facility] nursing team will not follow, but will remain available to this patient, the nursing and medical teams.  Please re-consult if needed.  Thank you for inviting [place] to participate in this patient's Plan of Care.  Maudie Flakes, MSN, RN, CNS, [place], Serita Grammes, [employer], [employer] phone:  [phone]

## 2022-05-06 NOTE — Assessment & Plan Note (Signed)
-   He still has not followed up with oncology due to recent frequent hospitalizations -- will try to see if oncology can see him here in hospital per family request -- he also failed to get PET scan that had been ordered for him

## 2022-05-06 NOTE — Progress Notes (Signed)
Patient called out stating to call his family and that he feels that he is "not going to make it through the night." Patient was given ordered PRN fentanyl at 1717 for 10 out of 10 pain in lower legs where wound dressings were changed. No change in pain now. Patient states nothing is different other than that he feels like he "is not going to make it through the night." Patient called family from room phone to ask them to come to hospital. Dr. Wynetta Emery made aware of patient's statements. PRN oxycodone given at 1811 as well as PRN hydroxyzine. RN educated patient that he may need to wear his BiPAP mask once he finishes dinner. Patient states he will wear it after he gets to spend time with his family.

## 2022-05-06 NOTE — Assessment & Plan Note (Signed)
-   He is fully anticoagulated with apixaban which we would like to continue

## 2022-05-06 NOTE — Assessment & Plan Note (Signed)
-   Resume home medications when reconciled

## 2022-05-06 NOTE — Assessment & Plan Note (Signed)
-   Bleeding precautions while on anticoagulation therapy advised

## 2022-05-06 NOTE — ED Triage Notes (Signed)
Pt BIB [facility], EMS states roommate called because pt was having difficultly breathing. Per EMS pt was 96% on 6L [place] in route.  Pt states he is on 5L [place] chronically and states he is always SOB. Pt does have bilateral pitting edema. EDP at bedside

## 2022-05-06 NOTE — Progress Notes (Signed)
Patient transferred form ED to ICU 12 without any complications.
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
## 2022-05-06 NOTE — Assessment & Plan Note (Signed)
-   He again presents with similar symptoms of severe dyspnea, hypoxemia, hypercarbic respiratory failure with somnolence.  He has been placed on BiPAP therapy and will monitor response.  Continue aggressive COPD treatment as ordered.

## 2022-05-06 NOTE — TOC Initial Note (Signed)
Transition of Care [facility]) - Initial/Assessment Note    Patient Details  Name: Tony Sullivan MRN: [id number] Date of Birth: October 01, 1961  Transition of [facility] CM/SW Contact:    Shade Flood, LCSW Phone Number: 05/06/2022, 11:50 AM  Clinical Narrative:                  Pt high risk for readmission. Pt well known to TOC from multiple admissions. Pt lives alone and he will return home at dc. Pt has an NIV from [employer] and their RT is working with pt on a scheduled basis for education and follow up. Unknown if pt is compliant with use of NIV at home.  Please refer to [facility] notes from pt's multiple previous admissions for detailed update on TOC attempts to assist pt.    Expected Discharge Plan: Home/Self Care Barriers to Discharge: Continued Medical Work up   Patient Goals and CMS Choice        Expected Discharge Plan and Services Expected Discharge Plan: Home/Self Care In-house Referral: Clinical Social Work     Living arrangements for the past 2 months: Single Family Home                                      Prior Living Arrangements/Services Living arrangements for the past 2 months: Single Family Home Lives with:: Self Patient language and need for interpreter reviewed:: Yes Do you feel safe going back to the place where you live?: Yes      Need for Family Participation in Patient Care: No (Comment)   Current home services: DME Criminal Activity/Legal Involvement Pertinent to Current Situation/Hospitalization: No - Comment as needed  Activities of Daily Living Home Assistive Devices/Equipment: Wheelchair ADL Screening (condition at time of admission) Patient's cognitive ability adequate to safely complete daily activities?: Yes Is the patient deaf or have difficulty hearing?: No Does the patient have difficulty seeing, even when wearing glasses/contacts?: No Does the patient have difficulty concentrating, remembering, or making decisions?: No Patient able to  express need for assistance with ADLs?: Yes Does the patient have difficulty dressing or bathing?: Yes Independently performs ADLs?: No Communication: Needs assistance Is this a change from baseline?: Pre-admission baseline Dressing (OT): Needs assistance Is this a change from baseline?: Pre-admission baseline Grooming: Needs assistance Is this a change from baseline?: Pre-admission baseline Feeding: Needs assistance Is this a change from baseline?: Pre-admission baseline Bathing: Needs assistance Is this a change from baseline?: Pre-admission baseline Toileting: Needs assistance Is this a change from baseline?: Pre-admission baseline In/Out Bed: Needs assistance Is this a change from baseline?: Pre-admission baseline Walks in Home: Dependent Is this a change from baseline?: Pre-admission baseline Does the patient have difficulty walking or climbing stairs?: Yes Weakness of Legs: Both Weakness of Arms/Hands: None  Permission Sought/Granted                  Emotional Assessment       Orientation: : Oriented to Self, Oriented to Place, Oriented to  Time, Oriented to Situation Alcohol / Substance Use: Not Applicable Psych Involvement: No (comment)  Admission diagnosis:  COPD exacerbation ([employer]) [J44.1] Acute and chronic respiratory failure with hypoxia (HCC) [J96.21] Acute on chronic respiratory failure with hypoxia ([place]) [J96.21] Patient Active Problem List   Diagnosis Date Noted   Acute and chronic respiratory failure with hypoxia ([place]) 05/06/2022   Acute on chronic respiratory failure with hypoxia and  hypercapnia ([place]) 04/29/2022   Hyperkalemia 04/29/2022   AKI (acute kidney injury) ([place]) 04/29/2022   Non-small cell lung cancer ([place]) 04/29/2022   History of pulmonary embolus (PE) 04/29/2022   H/o Pulmonary embolism ([place]) 04/01/2022   Mass of upper lobe of left lung    Thoracic compression fracture ([place]) 03/20/2022   Hemoptysis 03/20/2022   Pneumonia 03/13/2022    COPD exacerbation ([place]) 02/27/2022   Pedal edema 02/19/2022   CAP (community acquired pneumonia) 01/07/2022   Prolonged QT interval 01/07/2022   Glaucoma 01/07/2022   Elevated troponin 10/27/2021   Hyperglycemia 10/27/2021   Elevated MCV 10/27/2021   Respiratory failure with hypoxia and hypercapnia ([place]) 10/27/2021   Acute exacerbation of chronic obstructive pulmonary disease (COPD) ([place]) 10/02/2021   Hypophosphatemia 10/02/2021   Hypoalbuminemia due to protein-calorie malnutrition ([place]) 10/02/2021   Physical deconditioning 07/26/2021   Numbness and tingling in left arm 05/06/2021   Leukocytosis 05/05/2021   Severe headache 05/05/2021   Mixed hyperlipidemia 05/24/2020   Hypokalemia 05/24/2020   Chest pain 05/23/2020   Bipolar disorder ([place]) 05/23/2020   HNP (herniated nucleus pulposus), cervical 09/06/2019   Abdominal pain    Poor dentition 11/04/2017   Acute on chronic respiratory failure with hypercapnia ([place]) 08/29/2017   ETOH abuse 03/30/2017   Cigarette smoker 03/30/2017   Acute respiratory failure with hypoxia and hypercapnia (HCC) 03/29/2017   Special screening for malignant neoplasms, colon    Benign neoplasm of ascending colon    Benign neoplasm of descending colon    Hoarseness 08/22/2016   GERD (gastroesophageal reflux disease) 08/22/2016   Atypical chest pain 12/05/2015   Essential hypertension 06/15/2014   Pectoralis muscle strain 01/11/2014   COPD with acute exacerbation ([place]) 01/10/2014   COPD (chronic obstructive pulmonary disease) ([place]) 01/08/2014   Tobacco abuse 01/08/2014   Cough 01/08/2014   Chronic respiratory failure with hypoxia ([place]) 01/08/2014   PCP:  [name], FNP Pharmacy:   [facility], [address] AT [address] [place] [address] Phone: [phone] Fax: [phone]     Social Determinants of Health (SDOH) Interventions    Readmission Risk  Interventions    05/06/2022   11:49 AM 05/01/2022    7:51 AM 03/14/2022    3:54 PM  Readmission Risk Prevention Plan  Transportation Screening Complete Complete Complete  Medication Review Press photographer) Complete Complete Complete  PCP or Specialist appointment within 3-5 days of discharge   Not Complete  HRI or [place] Complete Complete Complete  SW Recovery Care/Counseling Consult Complete Complete Complete  Palliative Care Screening Not Applicable Not Applicable Not [place] [employer] Not Applicable Not Applicable Patient Refused

## 2022-05-06 NOTE — Assessment & Plan Note (Signed)
-   Reportedly recently quit tobacco however with his history of poor compliance were not sure how much he is smoking at this time

## 2022-05-06 NOTE — Assessment & Plan Note (Signed)
-   He is followed by pulmonologist Dr. [name] and he has been strongly advised to stop tobacco use. -He is resumed on steroids antibiotics and bronchodilators

## 2022-05-06 NOTE — H&P (Addendum)
History and Physical  [place] UXN:[id number] DOB: [birth year] DOA: 05/06/2022  PCP: [name], FNP  Patient coming from: BIB [facility] Level of care: Stepdown  I have personally briefly reviewed patient's old medical records in [place]  Chief Complaint: Shortness of breath   HPI: Tony Sullivan is a 61 year old gentleman longtime smoker with oxygen dependent COPD, chronic respiratory failure on 5 L nasal cannula, home NIV that he is not using regularly, failure to thrive, malnutrition, recently diagnosed NSCLC 03/22/2022 via bronchoscopy, pulmonary embolus diagnosed 03/13/2022 on apixaban, poor compliance, poor self-care and refusal to stay at SNF presented to the emergency department after recently being discharged with similar symptoms of severe shortness of breath, hypoxia, cough, productive sputum and somnolence.  He lives in an apartment that is not air conditioned and the extreme heat and humidity continues to exacerbate his COPD.  He also is notably noncompliant with using his home NIV despite home visits by respiratory therapy care.  Attempts to define goals of care have been unsuccessful in the past.  His care has been difficult to manage due to him not being cooperative and not participating in care.  EMS found him to be extremely hypoxic with a pulse ox in the 80% range while on supplemental nasal cannula oxygen.  He has been placed on BiPAP therapy.  His ABG revealed a PO2 of 68 with a PCO2 of 62 and pH of 7.41.  His SARS 2 coronavirus test was negative.  He is admitted with acute on chronic respiratory failure.   Review of Systems: Review of Systems  Constitutional:  Positive for malaise/fatigue and weight loss. Negative for chills and fever.  HENT: Negative.    Eyes: Negative.   Respiratory:  Positive for cough, hemoptysis, sputum production, shortness of breath and wheezing.   Cardiovascular: Negative.   Gastrointestinal:  Positive for  heartburn. Negative for abdominal pain, nausea and vomiting.  Genitourinary: Negative.   Musculoskeletal: Negative.   Skin: Negative.   Neurological: Negative.   Endo/Heme/Allergies: Negative.   Psychiatric/Behavioral: Negative.       Past Medical History:  Diagnosis Date   Anxiety    Arthritis    COPD (chronic obstructive pulmonary disease) ([place]) 2004   diagnosed in 2004, no PFT's to date.  Started on home O2 12/2013, after found to be desatting at PCP's office, and referred to pulmonology.   Depression    GERD (gastroesophageal reflux disease)    High blood pressure    Prediabetes    Seasonal allergies    Substance abuse ([place])    Tobacco dependence     Past Surgical History:  Procedure Laterality Date   ANTERIOR CERVICAL DECOMP/DISCECTOMY FUSION N/A 09/06/2019   Procedure: Anterior Cervical Discecectomy Fusion - Cerivcal Five-Cervical Six;  Surgeon: Kary Kos, MD;  Location: [facility];  Service: Neurosurgery;  Laterality: N/A;  Anterior Cervical Discecectomy Fusion - Cerivcal Five-Cervical Six   ANTERIOR CERVICAL DISCECTOMY  09/06/2019   APPENDECTOMY  1980   COLONOSCOPY WITH PROPOFOL N/A 12/10/2016   Procedure: COLONOSCOPY WITH PROPOFOL;  Surgeon: [name], MD;  Location: [facility];  Service: Endoscopy;  Laterality: N/A;   FLEXIBLE BRONCHOSCOPY Left 03/22/2022   Procedure: FLEXIBLE BRONCHOSCOPY;  Surgeon: Rigoberto Noel, MD;  [facility];  Service: Pulmonary;  Laterality: Left;   HIP SURGERY Right    SHOULDER ARTHROSCOPY Right      reports that he has been smoking cigarettes. He has a 9.00 pack-year  smoking history. He has never used smokeless tobacco. He reports that he does not currently use alcohol after a past usage of about 14.0 - 21.0 standard drinks of alcohol per week. He reports current drug use. Drugs: Marijuana and Cocaine.  Allergies  Allergen Reactions   Penicillins Anaphylaxis    Has patient had a PCN reaction causing immediate rash,  facial/tongue/throat swelling, SOB or lightheadedness with hypotension: Yes Has patient had a PCN reaction causing severe rash involving mucus membranes or skin necrosis: No Has patient had a PCN reaction that required hospitalization No Has patient had a PCN reaction occurring within the last 10 years: No If all of the above answers are "NO", then may proceed with Cephalosporin use.    Levocetirizine Other (See Comments)    Muscle cramps    Family History  Problem Relation Age of Onset   Emphysema Mother    Allergies Mother        "everyone in family"   Asthma Mother        "everyone in family"   Heart disease Mother    Clotting disorder Mother    Cancer Mother    Emphysema Father    Allergies Father    Allergies Son    Seizures Brother    Diabetes Brother     Prior to Admission medications   Medication Sig Start Date End Date Taking? Authorizing Provider  acetaminophen (TYLENOL) 325 MG tablet Take 2 tablets (650 mg total) by mouth every 6 (six) hours as needed for mild pain (or Fever >/= 101). 10/30/21   [name], MD  albuterol (PROVENTIL) (2.5 MG/3ML) 0.083% nebulizer solution Take 3 mLs (2.5 mg total) by nebulization every 4 (four) hours as needed for wheezing or shortness of breath. 12/12/21 12/12/22  [name], MD  albuterol (VENTOLIN HFA) 108 (90 Base) MCG/ACT inhaler Inhale 2 puffs into the lungs every 6 (six) hours as needed for wheezing or shortness of breath. 03/15/22   [name], Pratik D, DO  apixaban (ELIQUIS) 5 MG TABS tablet Take 1 tablet (5 mg total) by mouth 2 (two) times daily. 03/22/22 05/21/22  [name], Pratik D, DO  atorvastatin (LIPITOR) 40 MG tablet TAKE ONE TABLET BY MOUTH DAILY Patient taking differently: Take 40 mg by mouth daily. 07/06/20   [name], MD  benztropine (COGENTIN) 0.5 MG tablet Take 0.5 mg by mouth at bedtime.    [provider]  Budeson-Glycopyrrol-Formoterol (BREZTRI AEROSPHERE) 160-9-4.8 MCG/ACT AERO Inhale 2 puffs into the  lungs in the morning and at bedtime. 12/12/21   [name], MD  dextromethorphan-guaiFENesin (MUCINEX DM) 30-600 MG 12hr tablet Take 1 tablet by mouth 2 (two) times daily. 05/02/22   Barton Dubois, MD  diclofenac Sodium (VOLTAREN) 1 % GEL Apply 2 g topically 4 (four) times daily as needed (pain).    [provider]  dorzolamide (TRUSOPT) 2 % ophthalmic solution Place 1 drop into the left eye 2 (two) times daily. 12/12/20   [provider]  doxycycline (VIBRA-TABS) 100 MG tablet Take 1 tablet (100 mg total) by mouth every 12 (twelve) hours for 4 days. 05/02/22 05/06/22  Barton Dubois, MD  furosemide (LASIX) 20 MG tablet Take 1 tablet (20 mg total) by mouth daily. 04/06/22   Kathie Dike, MD  gabapentin (NEURONTIN) 100 MG capsule Take 1 capsule (100 mg total) by mouth 3 (three) times daily. 03/25/22 05/24/22  Deatra James, MD  hydrOXYzine (ATARAX) 25 MG tablet Take 1 tablet (25 mg total) by mouth  3 (three) times daily as needed for anxiety. 04/05/22   Kathie Dike, MD  latanoprost (XALATAN) 0.005 % ophthalmic solution Place 1 drop into both eyes at bedtime. 05/24/19   [provider]  loxapine (LOXITANE) 10 MG capsule Take 10 mg by mouth at bedtime.     [provider]  mirtazapine (REMERON) 30 MG tablet Take 1 tablet (30 mg total) by mouth at bedtime. 10/30/21   [name], MD  Multiple Vitamin (MULTIVITAMIN WITH MINERALS) TABS tablet Take 1 tablet by mouth daily. 10/31/21   [name], MD  OXYGEN Inhale 4-5 L into the lungs as directed. 4-5 liters with sleep and exertion as needed for low oxygen    [provider]  pantoprazole (PROTONIX) 40 MG tablet Take 1 tablet (40 mg total) by mouth 2 (two) times daily. 05/02/22   Barton Dubois, MD  polyethylene glycol (MIRALAX / GLYCOLAX) 17 g packet TAKE 17 GRAMS BY MOUTH DAILY Patient taking differently: Take 17 g by mouth daily. 03/09/20   [name], MD  potassium chloride (KLOR-CON M) 20 MEQ  tablet Take 1 tablet (20 mEq total) by mouth daily. 04/05/22   Kathie Dike, MD  predniSONE (DELTASONE) 20 MG tablet Take 3 tablets by mouth daily x1 day; then 2 tablets by mouth daily x2-day; then 1 tablet by mouth daily x3 days; and then half tablet by mouth daily x3 days and stop prednisone. 05/02/22   Barton Dubois, MD  senna-docusate (SENOKOT-S) 8.6-50 MG tablet Take 2 tablets by mouth at bedtime. Patient taking differently: Take 2 tablets by mouth at bedtime as needed for mild constipation. 10/30/21   [name], MD  simethicone (MYLICON) 80 MG chewable tablet Chew 1 tablet (80 mg total) by mouth 4 (four) times daily as needed for flatulence. Patient taking differently: Chew 80 mg by mouth every 6 (six) hours as needed for flatulence. 03/15/22   [name], DO    Physical Exam: Vitals:   05/06/22 0919 05/06/22 0930 05/06/22 1023 05/06/22 1122  BP:  101/71    Pulse:      Resp: 18 14 (!) 23 15  Temp: 98.3 F (36.8 C)  (!) 96.8 F (36 C) 98.1 F (36.7 C)  TempSrc: Oral  Axillary Axillary  SpO2: 94%     Weight:   54.5 kg   Height:   _0  (1.803 m)     Constitutional: emaciated chronically ill appearing, appears older than stated age.  Seen on bipap. NAD, calm, comfortable Eyes: PERRL, lids and conjunctivae normal ENMT: Mucous membranes are moist. Posterior pharynx clear of any exudate or lesions. Poor dentition.  Neck: normal, supple, no masses, no thyromegaly Respiratory: tight, poor air movement with expiratory wheezing.  Cardiovascular: normal s1, s2 sounds, no murmurs / rubs / gallops. No extremity edema. 2+ pedal pulses. No carotid bruits.  Abdomen: no tenderness, no masses palpated. No hepatosplenomegaly. Bowel sounds positive.  Musculoskeletal: no clubbing / cyanosis. No joint deformity upper and lower extremities. Good ROM, no contractures. Normal muscle tone.  Skin: open blister right foot, left leg no s/s of infection, hypopigmented skin seen  Neurologic: CN 2-12  grossly intact. Sensation intact, DTR normal. Strength 5/5 in all 4.  Psychiatric: Poor judgment and insight. somnolent. Mood unable to assess.   Labs on Admission: I have personally reviewed following labs and imaging studies  CBC: Recent Labs  Lab 04/30/22 1544 05/06/22 0420  WBC 11.4* 9.5  NEUTROABS  --  7.1  HGB 10.3* 11.0*  HCT 34.1* 36.7*  MCV 105.6* 106.4*  PLT 240 829   Basic Metabolic Panel: Recent Labs  Lab 04/30/22 1451 05/02/22 0456 05/06/22 0420  NA 143 141 141  K 4.1 3.6 3.5  CL 105 101 102  CO2 31 33* 30  GLUCOSE 189* 120* 94  BUN 30* 26* 24*  CREATININE 0.97 0.85 0.66  CALCIUM 8.7* 9.1 9.0  MG 2.4 2.1  --    GFR: Estimated Creatinine Clearance: 77.6 mL/min (by C-G formula based on SCr of 0.66 mg/dL). Liver Function Tests: No results for input(s): "AST", "ALT", "ALKPHOS", "BILITOT", "PROT", "ALBUMIN" in the last 168 hours. No results for input(s): "LIPASE", "AMYLASE" in the last 168 hours. No results for input(s): "AMMONIA" in the last 168 hours. Coagulation Profile: No results for input(s): "INR", "PROTIME" in the last 168 hours. Cardiac Enzymes: Recent Labs  Lab 04/30/22 1451  CKTOTAL 294   BNP (last 3 results) No results for input(s): "PROBNP" in the last 8760 hours. HbA1C: No results for input(s): "HGBA1C" in the last 72 hours. CBG: Recent Labs  Lab 05/01/22 2350 05/02/22 0514 05/02/22 1107 05/02/22 1822 05/02/22 2352  GLUCAP 169* 143* 175* 114* 131*   Lipid Profile: No results for input(s): "CHOL", "HDL", "LDLCALC", "TRIG", "CHOLHDL", "LDLDIRECT" in the last 72 hours. Thyroid Function Tests: No results for input(s): "TSH", "T4TOTAL", "FREET4", "T3FREE", "THYROIDAB" in the last 72 hours. Anemia Panel: No results for input(s): "VITAMINB12", "FOLATE", "FERRITIN", "TIBC", "IRON", "RETICCTPCT" in the last 72 hours. Urine analysis:    Component Value Date/Time   COLORURINE YELLOW 04/29/2022 0803   APPEARANCEUR HAZY (A) 04/29/2022  0803   LABSPEC 1.017 04/29/2022 0803   PHURINE 5.0 04/29/2022 0803   GLUCOSEU NEGATIVE 04/29/2022 0803   HGBUR NEGATIVE 04/29/2022 0803   BILIRUBINUR NEGATIVE 04/29/2022 0803   KETONESUR 20 (A) 04/29/2022 0803   PROTEINUR NEGATIVE 04/29/2022 0803   UROBILINOGEN 1.0 06/12/2015 1021   NITRITE NEGATIVE 04/29/2022 0803   LEUKOCYTESUR NEGATIVE 04/29/2022 0803    Radiological Exams on Admission: DG Chest Port 1 View  Result Date: 05/06/2022 CLINICAL DATA:  Shortness of breath EXAM: PORTABLE CHEST 1 VIEW COMPARISON:  04/29/2022 radiograph and 03/18/2022 chest CT FINDINGS: Chronic hyperinflation and interstitial coarsening. Biapical lucency. Vague rounded density over the right chest correlates with rib fracture callus by CT 2 months ago. Stable appearance of left supra hilum where there is endobronchial mass and airway obstruction by comparison CT. Normal heart size and mediastinal contours. IMPRESSION: COPD without acute superimposed finding. Electronically Signed   By: Jorje Guild M.D.   On: 05/06/2022 04:49    EKG: Independently reviewed.   Assessment/Plan Principal Problem:   Acute and chronic respiratory failure with hypoxia (HCC) Active Problems:   Tobacco abuse   Chronic Cough   COPD with acute exacerbation (HCC)   Essential hypertension   GERD (gastroesophageal reflux disease)   Bipolar disorder (HCC)   H/o Pulmonary embolism (HCC)   Non-small cell lung cancer ([place])   Blistered skin right foot/left leg   Acquired thrombophilia (HCC)    Assessment and Plan: * Acute and chronic respiratory failure with hypoxia ([place]) - He again presents with similar symptoms of severe dyspnea, hypoxemia, hypercarbic respiratory failure with somnolence.  He has been placed on BiPAP therapy and will monitor response.  Continue aggressive COPD treatment as ordered.  Acquired thrombophilia ([place]) - Bleeding precautions while on anticoagulation therapy advised  Blistered skin right foot/left  leg - Wounds appear fresh and noninfected, requesting wound care nurse consultation for  topical treatments try to avoid getting them infected - I worry that patient may have placed feet in scalding tub of water  Non-small cell lung cancer ([place]) - He still has not followed up with oncology due to recent frequent hospitalizations  H/o Pulmonary embolism ([place]) - He is fully anticoagulated with apixaban which we would like to continue  Bipolar disorder ([place]) - Resume home medications when reconciled  GERD (gastroesophageal reflux disease) - Protonix ordered for GI protection  Essential hypertension - Plan to resume home medications when reconciled  COPD with acute exacerbation ([place]) - Continue IV steroids and IV antibiotics and scheduled bronchodilators  Chronic Cough - He is followed by pulmonologist Dr. [name] and he has been strongly advised to stop tobacco use. -He is resumed on steroids antibiotics and bronchodilators  Tobacco abuse - Reportedly recently quit tobacco however with his history of poor compliance were not sure how much he is smoking at this time   DVT prophylaxis: apixaban  Code Status: Full   Family Communication: none present   Disposition Plan: home   Consults called:   Admission status: INP  Level of care: Stepdown Irwin Brakeman MD [facility] How to contact the [facility] Attending or Consulting provider [place] or covering provider during after hours [place], for this patient?  Check the care team in [facility] and look for a) attending/consulting [facility] provider listed and b) the [facility] team listed Log into [email or web address] and use [place]'s universal password to access. If you do not have the password, please contact the hospital operator. Locate the [facility] provider you are looking for under [facility] and page to a number that you can be directly reached. If you still have difficulty reaching the provider, please page the [facility] (Director on Call) for the  Hospitalists listed on amion for assistance.   If 7PM-7AM, please contact night-coverage [email or web address] Password [email or web address]  05/06/2022, 1:23 PM

## 2022-05-06 NOTE — Assessment & Plan Note (Signed)
-   Wounds appear fresh and noninfected, requesting wound care nurse consultation for topical treatments try to avoid getting them infected - Pt says blisters originated from his chair that he is in for many hours of the day

## 2022-05-06 NOTE — Assessment & Plan Note (Signed)
-   Protonix ordered for GI protection

## 2022-05-06 NOTE — Assessment & Plan Note (Signed)
-   Plan to resume home medications when reconciled

## 2022-05-06 NOTE — ED Notes (Addendum)
Pt given some water to drink and mouth care performed

## 2022-05-07 ENCOUNTER — Inpatient Hospital Stay (HOSPITAL_COMMUNITY): Payer: Medicaid Other

## 2022-05-07 ENCOUNTER — Encounter (HOSPITAL_COMMUNITY): Payer: Self-pay | Admitting: Family Medicine

## 2022-05-07 ENCOUNTER — Ambulatory Visit (HOSPITAL_COMMUNITY): Payer: Medicaid Other | Admitting: Hematology

## 2022-05-07 DIAGNOSIS — I2694 Multiple subsegmental pulmonary emboli without acute cor pulmonale: Secondary | ICD-10-CM | POA: Diagnosis not present

## 2022-05-07 DIAGNOSIS — C7A1 Malignant poorly differentiated neuroendocrine tumors: Secondary | ICD-10-CM

## 2022-05-07 DIAGNOSIS — J441 Chronic obstructive pulmonary disease with (acute) exacerbation: Secondary | ICD-10-CM | POA: Diagnosis not present

## 2022-05-07 DIAGNOSIS — D6869 Other thrombophilia: Secondary | ICD-10-CM

## 2022-05-07 DIAGNOSIS — F319 Bipolar disorder, unspecified: Secondary | ICD-10-CM | POA: Diagnosis not present

## 2022-05-07 DIAGNOSIS — J9621 Acute and chronic respiratory failure with hypoxia: Secondary | ICD-10-CM | POA: Diagnosis not present

## 2022-05-07 DIAGNOSIS — Z720 Tobacco use: Secondary | ICD-10-CM | POA: Diagnosis not present

## 2022-05-07 LAB — CBC
HCT: 30.1 % — ABNORMAL LOW (ref 39.0–52.0)
Hemoglobin: 9.1 g/dL — ABNORMAL LOW (ref 13.0–17.0)
MCH: 31.7 pg (ref 26.0–34.0)
MCHC: 30.2 g/dL (ref 30.0–36.0)
MCV: 104.9 fL — ABNORMAL HIGH (ref 80.0–100.0)
Platelets: 205 10*3/uL (ref 150–400)
RBC: 2.87 MIL/uL — ABNORMAL LOW (ref 4.22–5.81)
RDW: 15.9 % — ABNORMAL HIGH (ref 11.5–15.5)
WBC: 8.9 10*3/uL (ref 4.0–10.5)
nRBC: 0 % (ref 0.0–0.2)

## 2022-05-07 LAB — BASIC METABOLIC PANEL
Anion gap: 8 (ref 5–15)
BUN: 21 mg/dL — ABNORMAL HIGH (ref 6–20)
CO2: 32 mmol/L (ref 22–32)
Calcium: 8.2 mg/dL — ABNORMAL LOW (ref 8.9–10.3)
Chloride: 100 mmol/L (ref 98–111)
Creatinine, Ser: 0.66 mg/dL (ref 0.61–1.24)
GFR, Estimated: >60 mL/min (ref >60)
Glucose, Bld: 168 mg/dL — ABNORMAL HIGH (ref 70–99)
Potassium: 3.5 mmol/L (ref 3.5–5.1)
Sodium: 140 mmol/L (ref 135–145)

## 2022-05-07 LAB — MAGNESIUM: Magnesium: 1.9 mg/dL (ref 1.7–2.4)

## 2022-05-07 MED ORDER — DOXYCYCLINE HYCLATE 100 MG PO TABS
100.0000 mg | ORAL_TABLET | Freq: Two times a day (BID) | ORAL | Status: DC
  Administered 2022-05-07 – 2022-05-09 (×4): 100 mg via ORAL
  Filled 2022-05-07 (×4): qty 1

## 2022-05-07 MED ORDER — IOHEXOL 300 MG/ML  SOLN
100.0000 mL | Freq: Once | INTRAMUSCULAR | Status: AC | PRN
  Administered 2022-05-07: 100 mL via INTRAVENOUS

## 2022-05-07 MED ORDER — GADOBUTROL 1 MMOL/ML IV SOLN
7.0000 mL | Freq: Once | INTRAVENOUS | Status: AC | PRN
Start: 2022-05-07 — End: 2022-05-07
  Administered 2022-05-07: 7 mL via INTRAVENOUS

## 2022-05-07 MED ORDER — IOHEXOL 9 MG/ML PO SOLN
ORAL | Status: AC
  Filled 2022-05-07: qty 500

## 2022-05-07 MED ORDER — IOHEXOL 9 MG/ML PO SOLN
ORAL | Status: AC
  Administered 2022-05-07: 500 mL
  Filled 2022-05-07: qty 1000

## 2022-05-07 NOTE — Progress Notes (Signed)
Pt given breathing treatment at [place] 100% on 5lpm [place] hr 80 rr 20 bs diminished and wheezing. Patient asked about BREZTRI inhaler which I informed patient hospital do not carry but to informed MD in the am. Pt called family to bring medicine from home, patient's nurse and charge nurse informed

## 2022-05-07 NOTE — Assessment & Plan Note (Signed)
--   appreciate WOC RN recommendations

## 2022-05-07 NOTE — [facility] Oncology Navigator Note (Signed)
Order placed for CT CAP and brain MRI per verbal order from Dr. Delton Coombes

## 2022-05-07 NOTE — Consult Note (Signed)
[facility] Consultation Oncology  Name: Tony Sullivan      MRN: [id number]    Location: IC12/IC12-01  Date: 05/07/2022 Time:12:21 PM   REFERRING PHYSICIAN: Dr. 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  Bronchoscopy on 03/22/2022 showed endobronchial mass blocking takeoff of LUL bronchus, lingular bronchus was open.  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  He has chronic respiratory failure and is on 5 L nasal cannula at home.  He reports that he stays at home with his roommate.  He was found to be extremely hypoxic by EMS and was brought to the hospital.  He has been placed on BiPAP therapy.  He is back to oxygen via nasal cannula.  He has missed several appointments with [place] in the office as outpatient for follow-up of newly diagnosed lung cancer.  PAST MEDICAL HISTORY:   Past Medical History:  Diagnosis Date   Anxiety    Arthritis    COPD (chronic obstructive pulmonary disease) ([place]) 2004   diagnosed in 2004, no PFT's to date.  Started on home O2 12/2013, after found to be desatting at PCP's office, and referred to pulmonology.   Depression    GERD (gastroesophageal reflux disease)    High blood pressure    Prediabetes    Seasonal allergies    Substance abuse (HCC)    Tobacco dependence     ALLERGIES: Allergies  Allergen Reactions   Penicillins Anaphylaxis    Has patient had a PCN reaction causing immediate rash, facial/tongue/throat swelling, SOB or lightheadedness with hypotension: Yes Has patient had a PCN reaction causing severe rash involving mucus membranes or skin necrosis: No Has patient had a PCN reaction that required hospitalization No Has  patient had a PCN reaction occurring within the last 10 years: No If all of the above answers are "NO", then may proceed with Cephalosporin use.    Levocetirizine Other (See Comments)    Muscle cramps      MEDICATIONS: I have reviewed the patient's current medications.     PAST SURGICAL HISTORY Past Surgical History:  Procedure Laterality Date   ANTERIOR CERVICAL DECOMP/DISCECTOMY FUSION N/A 09/06/2019   Procedure: Anterior Cervical Discecectomy Fusion - Cerivcal Five-Cervical Six;  Surgeon: Kary Kos, MD;  Location: [place];  Service: Neurosurgery;  Laterality: N/A;  Anterior Cervical Discecectomy Fusion - Cerivcal Five-Cervical Six   ANTERIOR CERVICAL DISCECTOMY  09/06/2019   APPENDECTOMY  1980   COLONOSCOPY WITH PROPOFOL N/A 12/10/2016   Procedure: COLONOSCOPY WITH PROPOFOL;  Surgeon: [name], MD;  Location: [facility];  Service: Endoscopy;  Laterality: N/A;   FLEXIBLE BRONCHOSCOPY Left 03/22/2022   Procedure: FLEXIBLE BRONCHOSCOPY;  Surgeon: Rigoberto Noel, MD;  Location: [facility];  Service: Pulmonary;  Laterality: Left;   HIP SURGERY Right    SHOULDER ARTHROSCOPY Right     FAMILY HISTORY: Family History  Problem Relation Age of Onset   Emphysema Mother    Allergies Mother        "everyone in family"   Asthma Mother        "everyone in family"   Heart disease Mother    Clotting disorder Mother    Cancer Mother    Emphysema Father  Allergies Father    Allergies Son    Seizures Brother    Diabetes Brother     SOCIAL HISTORY:  reports that he has been smoking cigarettes. He has a 9.00 pack-year smoking history. He has never used smokeless tobacco. He reports that he does not currently use alcohol after a past usage of about 14.0 - 21.0 standard drinks of alcohol per week. He reports current drug use. Drugs: Marijuana and Cocaine.  PERFORMANCE STATUS: The patient's performance status is 3 - Symptomatic, >50% confined to bed  PHYSICAL EXAM: Most Recent Vital  Signs: Blood pressure 128/70, pulse 75, temperature 98.1 F (36.7 C), temperature source Oral, resp. rate 17, height 5' 11" (1.803 m), weight 120 lb 2.4 oz (54.5 kg), SpO2 100 %. BP (!) 149/92 (BP Location: Left Arm)   Pulse 81   Temp 98.7 F (37.1 C) (Oral)   Resp 18   Ht 5' 11" (1.803 m)   Wt 120 lb 2.4 oz (54.5 kg)   SpO2 95%   BMI 16.76 kg/m  General appearance: alert, cooperative, and appears stated age Abdomen:  Soft, nontender with no palpable organomegaly. Extremities:  Bandages on the right foot and left leg above ankle.  No edema. Neurologic: Grossly normal  LABORATORY DATA:  Results for orders placed or performed during the hospital encounter of 05/06/22 (from the past 48 hour(s))  CBC with Differential/Platelet     Status: Abnormal   Collection Time: 05/06/22  4:20 AM  Result Value Ref Range   WBC 9.5 4.0 - 10.5 K/uL   RBC 3.45 (L) 4.22 - 5.81 MIL/uL   Hemoglobin 11.0 (L) 13.0 - 17.0 g/dL   HCT 36.7 (L) 39.0 - 52.0 %   MCV 106.4 (H) 80.0 - 100.0 fL   MCH 31.9 26.0 - 34.0 pg   MCHC 30.0 30.0 - 36.0 g/dL   RDW 16.5 (H) 11.5 - 15.5 %   Platelets 200 150 - 400 K/uL   nRBC 0.0 0.0 - 0.2 %   Neutrophils Relative % 74 %   Neutro Abs 7.1 1.7 - 7.7 K/uL   Lymphocytes Relative 10 %   Lymphs Abs 0.9 0.7 - 4.0 K/uL   Monocytes Relative 13 %   Monocytes Absolute 1.2 (H) 0.1 - 1.0 K/uL   Eosinophils Relative 0 %   Eosinophils Absolute 0.0 0.0 - 0.5 K/uL   Basophils Relative 1 %   Basophils Absolute 0.1 0.0 - 0.1 K/uL   Immature Granulocytes 2 %   Abs Immature Granulocytes 0.20 (H) 0.00 - 0.07 K/uL    Comment: Performed at [facility], [address], [place], [place] [address]  Basic metabolic panel     Status: Abnormal   Collection Time: 05/06/22  4:20 AM  Result Value Ref Range   Sodium 141 135 - 145 mmol/L   Potassium 3.5 3.5 - 5.1 mmol/L   Chloride 102 98 - 111 mmol/L   CO2 30 22 - 32 mmol/L   Glucose, Bld 94 70 - 99 mg/dL    Comment: Glucose reference range  applies only to samples taken after fasting for at least 8 hours.   BUN 24 (H) 6 - 20 mg/dL   Creatinine, Ser 0.66 0.61 - 1.24 mg/dL   Calcium 9.0 8.9 - 10.3 mg/dL   GFR, Estimated >60 >60 mL/min    Comment: (NOTE) Calculated using the CKD-EPI Creatinine Equation (2021)    Anion gap 9 5 - 15    Comment: Performed at [facility], [address] [place], [place] [address]  Troponin I (High Sensitivity)     Status: None   Collection Time: 05/06/22  4:20 AM  Result Value Ref Range   Troponin I (High Sensitivity) 17 <18 ng/L    Comment: (NOTE) Elevated high sensitivity troponin I (hsTnI) values and significant  changes across serial measurements may suggest ACS but many other  chronic and acute conditions are known to elevate hsTnI results.  Refer to the "Links" section for chest pain algorithms and additional  guidance. Performed at [facility], [address], [place], [place] [address]   Brain natriuretic peptide     Status: None   Collection Time: 05/06/22  4:20 AM  Result Value Ref Range   B Natriuretic Peptide 66.0 0.0 - 100.0 pg/mL    Comment: Performed at [facility], [address], [place], [place] [address]  Blood gas, arterial     Status: Abnormal   Collection Time: 05/06/22  4:30 AM  Result Value Ref Range   FIO2 40.0 %   pH, Arterial 7.41 7.35 - 7.45   pCO2 arterial 62 (H) 32 - 48 mmHg   pO2, Arterial 68 (L) 83 - 108 mmHg   Bicarbonate 39.3 (H) 20.0 - 28.0 mmol/L   Acid-Base Excess 12.0 (H) 0.0 - 2.0 mmol/L   O2 Saturation 95 %   Patient temperature 37.0    Collection site LEFT RADIAL    Drawn by [id number]    Allens test (pass/fail) PASS PASS    Comment: Performed at [facility], [address], [place], [place] [address]  Troponin I (High Sensitivity)     Status: Abnormal   Collection Time: 05/06/22  6:10 AM  Result Value Ref Range   Troponin I (High Sensitivity) 18 (H) <18 ng/L    Comment: (NOTE) Elevated high sensitivity troponin I (hsTnI) values and  significant  changes across serial measurements may suggest ACS but many other  chronic and acute conditions are known to elevate hsTnI results.  Refer to the "Links" section for chest pain algorithms and additional  guidance. Performed at [facility], [address], [place], [place] [address]   SARS Coronavirus 2 by RT PCR (hospital order, performed in [facility] hospital lab) *cepheid single result test* Anterior Nasal Swab     Status: None   Collection Time: 05/06/22  7:17 AM   Specimen: Anterior Nasal Swab  Result Value Ref Range   SARS Coronavirus 2 by RT PCR NEGATIVE NEGATIVE    Comment: (NOTE) SARS-CoV-2 target nucleic acids are NOT DETECTED.  The SARS-CoV-2 RNA is generally detectable in upper and lower respiratory specimens during the acute phase of infection. The lowest concentration of SARS-CoV-2 viral copies this assay can detect is 250 copies / mL. A negative result does not preclude SARS-CoV-2 infection and should not be used as the sole basis for treatment or other patient management decisions.  A negative result may occur with improper specimen collection / handling, submission of specimen other than nasopharyngeal swab, presence of viral mutation(s) within the areas targeted by this assay, and inadequate number of viral copies (<250 copies / mL). A negative result must be combined with clinical observations, patient history, and epidemiological information.  Fact Sheet for Patients:   https://www.patel.info/  Fact Sheet for Healthcare Providers: https://hall.com/  This test is not yet approved or  cleared by the [place] FDA and has been authorized for detection and/or diagnosis of SARS-CoV-2 by FDA under an Emergency Use Authorization (EUA).  This EUA  will remain in effect (meaning this test can be used) for the duration of the COVID-19 declaration under Section 564(b)(1) of the Act, 21 U.S.C. section  360bbb-3(b)(1), unless the authorization is terminated or revoked sooner.  Performed at [facility], [address] [place], [place] [address]   Magnesium     Status: None   Collection Time: 05/07/22  3:57 AM  Result Value Ref Range   Magnesium 1.9 1.7 - 2.4 mg/dL    Comment: Performed at [facility], [address], [place], [place] [address]  Basic metabolic panel     Status: Abnormal   Collection Time: 05/07/22  3:57 AM  Result Value Ref Range   Sodium 140 135 - 145 mmol/L   Potassium 3.5 3.5 - 5.1 mmol/L   Chloride 100 98 - 111 mmol/L   CO2 32 22 - 32 mmol/L   Glucose, Bld 168 (H) 70 - 99 mg/dL    Comment: Glucose reference range applies only to samples taken after fasting for at least 8 hours.   BUN 21 (H) 6 - 20 mg/dL   Creatinine, Ser 0.66 0.61 - 1.24 mg/dL   Calcium 8.2 (L) 8.9 - 10.3 mg/dL   GFR, Estimated >60 >60 mL/min    Comment: (NOTE) Calculated using the CKD-EPI Creatinine Equation (2021)    Anion gap 8 5 - 15    Comment: Performed at [facility], [address], [place], [place] [address]  CBC     Status: Abnormal   Collection Time: 05/07/22  3:57 AM  Result Value Ref Range   WBC 8.9 4.0 - 10.5 K/uL   RBC 2.87 (L) 4.22 - 5.81 MIL/uL   Hemoglobin 9.1 (L) 13.0 - 17.0 g/dL   HCT 30.1 (L) 39.0 - 52.0 %   MCV 104.9 (H) 80.0 - 100.0 fL   MCH 31.7 26.0 - 34.0 pg   MCHC 30.2 30.0 - 36.0 g/dL   RDW 15.9 (H) 11.5 - 15.5 %   Platelets 205 150 - 400 K/uL   nRBC 0.0 0.0 - 0.2 %    Comment: Performed at [facility], [address], [place], [place] [address]      RADIOGRAPHY: DG Chest Port 1 View  Result Date: 05/06/2022 CLINICAL DATA:  Shortness of breath EXAM: PORTABLE CHEST 1 VIEW COMPARISON:  04/29/2022 radiograph and 03/18/2022 chest CT FINDINGS: Chronic hyperinflation and interstitial coarsening. Biapical lucency. Vague rounded density over the right chest correlates with rib fracture callus by CT 2 months ago. Stable appearance of left supra hilum where there  is endobronchial mass and airway obstruction by comparison CT. Normal heart size and mediastinal contours. IMPRESSION: COPD without acute superimposed finding. Electronically Signed   By: Jorje Guild M.D.   On: 05/06/2022 04:49       PATHOLOGY: Biopsy from 03/22/2022 reviewed.  ASSESSMENT and PLAN:  1.  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  Overall small clot burden with no findings of acute right heart strain.  Subsegmental/segmental airway opacification of the LUL. - Bronchoscopy 03/22/2022: Endobronchial mass seen blocking takeoff of LUL bronchus. - Biopsy consistent with malignant non-small cell cancer with tumor necrosis.  Immunostains show this may represent primary large cell neuroendocrine carcinoma.  CD56 is positive in small populations of cells.  Chromogranin and synaptophysin is negative.  TTF-1 and Napsin a negative.  CK5/6 and p63 negative.  RCC is negative.  Ki-67 moderate proliferation index. - CT chest with contrast on 03/18/2022: Stable endobronchial mass within the left upper lobe pulmonary bronchus with extension to multiple peripheral segmental bronchi.  No adenopathy. - He has COPD and is oxygen dependent and has missed several appointments with [place] in the office.  He could not have a PET scan done either. - I have recommended CT CAP with contrast and a brain MRI for further staging of large cell neuroendocrine carcinoma. - I will talk to him again after the scans to discuss further plan.  2.  Pulmonary embolism: - Continue apixaban twice daily.  3.  COPD with exacerbation: - Continue IV steroids and antibiotics and bronchodilators. - Continue nightly BiPAP.  All questions were answered. The patient knows to call the clinic  with any problems, questions or concerns. We can certainly see the patient much sooner if necessary.   Derek Jack

## 2022-05-07 NOTE — Progress Notes (Signed)
Pt refused BIPAP for tonight, pt resting and informed patient to call if he changes his find about machine

## 2022-05-07 NOTE — Progress Notes (Signed)
Stephanie with [facility]  at bedside. Patient does not wish to speak to her. Patient calls a family member and has them speak with Colletta [place]. Pt is agitated and demands something for anxiety. Atarax given. [name]

## 2022-05-07 NOTE — Progress Notes (Signed)
Patient transferred from ICU at 1440. Iohexol given and started at 1310 for CT scan. No complaints or concerns at this time.

## 2022-05-07 NOTE — Progress Notes (Signed)
Pt's family member called requesting an inpatient oncology consult from Dr. Jamey Reas. Tony Sullivan

## 2022-05-07 NOTE — Progress Notes (Signed)
PROGRESS NOTE   Tony Sullivan  [facility]MC:[id number] DOB: [birth year] DOA: 05/06/2022 PCP: [name], FNP   Chief Complaint  Patient presents with   Shortness of Breath   Level of care: Telemetry  Brief Admission History:  61 year old gentleman longtime smoker with oxygen dependent COPD, chronic respiratory failure on 5 L nasal cannula, home NIV that he is not using regularly, failure to thrive, malnutrition, recently diagnosed NSCLC 03/22/2022 via bronchoscopy, pulmonary embolus diagnosed 03/13/2022 on apixaban, poor compliance, poor self-care and refusal to stay at SNF presented to the emergency department after recently being discharged with similar symptoms of severe shortness of breath, hypoxia, cough, productive sputum and somnolence.  He lives in an apartment that is not air conditioned and the extreme heat and humidity continues to exacerbate his COPD.  He also is notably noncompliant with using his home NIV despite home visits by respiratory therapy care.  Attempts to define goals of care have been unsuccessful in the past.  His care has been difficult to manage due to him not being cooperative and not participating in care.  EMS found him to be extremely hypoxic with a pulse ox in the 80% range while on supplemental nasal cannula oxygen.  He has been placed on BiPAP therapy.  His ABG revealed a PO2 of 68 with a PCO2 of 62 and pH of 7.41.  His SARS 2 coronavirus test was negative.  He is admitted with acute on chronic respiratory failure.   Assessment and Plan: * Acute and chronic respiratory failure with hypoxia ([place]) - He again presents with similar symptoms of severe dyspnea, hypoxemia, hypercarbic respiratory failure with somnolence.  He has been placed on BiPAP therapy and will monitor response.  Continue aggressive COPD treatment as ordered.  Pressure injury of skin -- appreciate WOC RN recommendations  Acquired thrombophilia ([place]) - Bleeding precautions while on anticoagulation  therapy advised  Blistered skin right foot/left leg - Wounds appear fresh and noninfected, requesting wound care nurse consultation for topical treatments try to avoid getting them infected - Pt says blisters originated from his chair that he is in for many hours of the day  Non-small cell lung cancer ([place]) - He still has not followed up with oncology due to recent frequent hospitalizations -- will try to see if oncology can see him here in hospital per family request -- he also failed to get PET scan that had been ordered for him  H/o Pulmonary embolism ([place]) - He is fully anticoagulated with apixaban which we would like to continue  Bipolar disorder ([place]) - Resume home medications when reconciled  GERD (gastroesophageal reflux disease) - Protonix ordered for GI protection  Essential hypertension - Plan to resume home medications when reconciled  COPD with acute exacerbation ([place]) - Continue IV steroids and IV antibiotics and scheduled bronchodilators -- continue nightly bipap (TOC says he has an NIV at his home to use)   Chronic Cough - He is followed by pulmonologist Dr. [name] and he has been strongly advised to stop tobacco use. -He is resumed on steroids antibiotics and bronchodilators  Tobacco abuse - Reportedly recently quit tobacco however with his history of poor compliance were not sure how much he is smoking at this time   DVT prophylaxis: enoxaparin Code Status: full  Family Communication: daughter requested inpatient oncology consultation Disposition: Status is: Inpatient Remains inpatient appropriate because: IV steroids, intensity   Consultants:  Oncology  Procedures:  bipap Antimicrobials:  doxycycline  Subjective: Pt reports choking cough,  purulent sputum production, chest tightness and headache.  Objective: Vitals:   05/07/22 1000 05/07/22 1100 05/07/22 1114 05/07/22 1200  BP: 132/71 128/70  (!) 142/78  Pulse: 88 89 75 88  Resp: 17 17 17  (!) 24   Temp:   98.1 F (36.7 C)   TempSrc:   Oral   SpO2: 100% 100% 100% 100%  Weight:      Height:        Intake/Output Summary (Last 24 hours) at 05/07/2022 1346 Last data filed at 05/07/2022 1247 Gross per 24 hour  Intake 2333.33 ml  Output 200 ml  Net 2133.33 ml   Filed Weights   05/06/22 0404 05/06/22 1023 05/07/22 0500  Weight: 60.5 kg 54.5 kg 54.5 kg   Examination:  General exam: Appears calm and comfortable  Respiratory system: Clear to auscultation. Respiratory effort normal. Cardiovascular system: normal S1 & S2 heard. No JVD, murmurs, rubs, gallops or clicks. No pedal edema. Gastrointestinal system: Abdomen is nondistended, soft and nontender. No organomegaly or masses felt. Normal bowel sounds heard. Central nervous system: Alert and oriented. No focal neurological deficits. Extremities: Symmetric 5 x 5 power. Skin: No rashes, lesions or ulcers. Psychiatry: Judgement and insight appear normal. Mood & affect appropriate.   Data Reviewed: I have personally reviewed following labs and imaging studies  CBC: Recent Labs  Lab 04/30/22 1544 05/06/22 0420 05/07/22 0357  WBC 11.4* 9.5 8.9  NEUTROABS  --  7.1  --   HGB 10.3* 11.0* 9.1*  HCT 34.1* 36.7* 30.1*  MCV 105.6* 106.4* 104.9*  PLT 240 200 948    Basic Metabolic Panel: Recent Labs  Lab 04/30/22 1451 05/02/22 0456 05/06/22 0420 05/07/22 0357  NA 143 141 141 140  K 4.1 3.6 3.5 3.5  CL 105 101 102 100  CO2 31 33* 30 32  GLUCOSE 189* 120* 94 168*  BUN 30* 26* 24* 21*  CREATININE 0.97 0.85 0.66 0.66  CALCIUM 8.7* 9.1 9.0 8.2*  MG 2.4 2.1  --  1.9    CBG: Recent Labs  Lab 05/01/22 2350 05/02/22 0514 05/02/22 1107 05/02/22 1822 05/02/22 2352  GLUCAP 169* 143* 175* 114* 131*    Recent Results (from the past 240 hour(s))  Urine Culture     Status: Abnormal   Collection Time: 04/29/22  8:03 AM   Specimen: Urine, Clean Catch  Result Value Ref Range Status   Specimen Description   Final    URINE,  CLEAN CATCH Performed at [facility], [address] [place], [place] [address]    Special Requests   Final    NONE Performed at [facility], [address], [place], [place] [address]    Culture MULTIPLE SPECIES PRESENT, SUGGEST RECOLLECTION (A)  Final   Report Status 04/30/2022 FINAL  Final  SARS Coronavirus 2 by RT PCR (hospital order, performed in [facility] hospital lab) *cepheid single result test* Anterior Nasal Swab     Status: None   Collection Time: 05/06/22  7:17 AM   Specimen: Anterior Nasal Swab  Result Value Ref Range Status   SARS Coronavirus 2 by RT PCR NEGATIVE NEGATIVE Final    Comment: (NOTE) SARS-CoV-2 target nucleic acids are NOT DETECTED.  The SARS-CoV-2 RNA is generally detectable in upper and lower respiratory specimens during the acute phase of infection. The lowest concentration of SARS-CoV-2 viral copies this assay can detect is 250 copies / mL. A negative result does not preclude SARS-CoV-2 infection and should not be used as the sole basis  for treatment or other patient management decisions.  A negative result may occur with improper specimen collection / handling, submission of specimen other than nasopharyngeal swab, presence of viral mutation(s) within the areas targeted by this assay, and inadequate number of viral copies (<250 copies / mL). A negative result must be combined with clinical observations, patient history, and epidemiological information.  Fact Sheet for Patients:   https://www.patel.info/  Fact Sheet for Healthcare Providers: https://hall.com/  This test is not yet approved or  cleared by the [place] FDA and has been authorized for detection and/or diagnosis of SARS-CoV-2 by FDA under an Emergency Use Authorization (EUA).  This EUA will remain in effect (meaning this test can be used) for the duration of the COVID-19 declaration under Section 564(b)(1) of the Act, 21  U.S.C. section 360bbb-3(b)(1), unless the authorization is terminated or revoked sooner.  Performed at [facility], [address], [place], [place] [address]      Radiology Studies: [facility] Chest [facility] 1 View  Result Date: 05/06/2022 CLINICAL DATA:  Shortness of breath EXAM: PORTABLE CHEST 1 VIEW COMPARISON:  04/29/2022 radiograph and 03/18/2022 chest CT FINDINGS: Chronic hyperinflation and interstitial coarsening. Biapical lucency. Vague rounded density over the right chest correlates with rib fracture callus by CT 2 months ago. Stable appearance of left supra hilum where there is endobronchial mass and airway obstruction by comparison CT. Normal heart size and mediastinal contours. IMPRESSION: COPD without acute superimposed finding. Electronically Signed   By: Jorje Guild M.D.   On: 05/06/2022 04:49    Scheduled Meds:  atorvastatin  40 mg Oral QPM   Chlorhexidine Gluconate Cloth  6 each Topical Daily   enoxaparin (LOVENOX) injection  40 mg Subcutaneous Q24H   ipratropium-albuterol  3 mL Nebulization Q6H   methylPREDNISolone (SOLU-MEDROL) injection  60 mg Intravenous Q12H   mouth rinse  15 mL Mouth Rinse 4 times per day   silver sulfADIAZINE   Topical BID   Continuous Infusions:  doxycycline (VIBRAMYCIN) IV Stopped (05/07/22 1341)     LOS: 1 day   Time spent: 37 mins  Beverlie Kurihara Wynetta Emery, MD How to contact the [facility] Attending or Consulting provider  or covering provider during after hours [place], for this patient?  Check the care team in [facility] and look for a) attending/consulting [facility] provider listed and b) the [facility] team listed Log into [email or web address] and use 's universal password to access. If you do not have the password, please contact the hospital operator. Locate the [facility] provider you are looking for under [facility] and page to a number that you can be directly reached. If you still have difficulty reaching the provider, please page the [facility] (Director on Call)  for the Hospitalists listed on amion for assistance.  05/07/2022, 1:46 PM

## 2022-05-08 ENCOUNTER — Encounter (HOSPITAL_COMMUNITY): Payer: Self-pay | Admitting: Family Medicine

## 2022-05-08 DIAGNOSIS — T148XXA Other injury of unspecified body region, initial encounter: Secondary | ICD-10-CM | POA: Diagnosis not present

## 2022-05-08 MED ORDER — LOXAPINE SUCCINATE 10 MG PO CAPS: 10.0000 mg | ORAL_CAPSULE | Freq: Every day | ORAL | 1 refills | Status: AC

## 2022-05-08 MED ORDER — ALBUTEROL SULFATE (2.5 MG/3ML) 0.083% IN NEBU: 2.5000 mg | INHALATION_SOLUTION | RESPIRATORY_TRACT | 2 refills | Status: AC | PRN

## 2022-05-08 MED ORDER — HYDROXYZINE HCL 25 MG PO TABS
25.0000 mg | ORAL_TABLET | Freq: Three times a day (TID) | ORAL | 3 refills | Status: AC | PRN
Start: 2022-05-08 — End: ?

## 2022-05-08 MED ORDER — POTASSIUM CHLORIDE CRYS ER 20 MEQ PO TBCR: 20.0000 meq | EXTENDED_RELEASE_TABLET | Freq: Every day | ORAL | 0 refills | Status: DC

## 2022-05-08 MED ORDER — BREZTRI AEROSPHERE 160-9-4.8 MCG/ACT IN AERO
2.0000 | INHALATION_SPRAY | Freq: Two times a day (BID) | RESPIRATORY_TRACT | 3 refills | Status: AC
Start: 2022-05-08 — End: ?

## 2022-05-08 MED ORDER — APIXABAN 5 MG PO TABS: 5.0000 mg | ORAL_TABLET | Freq: Two times a day (BID) | ORAL | 0 refills | Status: AC

## 2022-05-08 MED ORDER — GABAPENTIN 100 MG PO CAPS: 100.0000 mg | ORAL_CAPSULE | Freq: Three times a day (TID) | ORAL | 3 refills | Status: DC

## 2022-05-08 MED ORDER — MIRTAZAPINE 30 MG PO TABS: 30.0000 mg | ORAL_TABLET | Freq: Every day | ORAL | 3 refills | Status: DC

## 2022-05-08 MED ORDER — DOXYCYCLINE HYCLATE 100 MG PO TABS: 100.0000 mg | ORAL_TABLET | Freq: Two times a day (BID) | ORAL | 0 refills | Status: AC

## 2022-05-08 MED ORDER — ADULT MULTIVITAMIN W/MINERALS CH: 1.0000 | ORAL_TABLET | Freq: Every day | ORAL | 2 refills | Status: DC

## 2022-05-08 MED ORDER — ALBUTEROL SULFATE HFA 108 (90 BASE) MCG/ACT IN AERS
2.0000 | INHALATION_SPRAY | Freq: Four times a day (QID) | RESPIRATORY_TRACT | 2 refills | Status: AC | PRN
Start: 2022-05-08 — End: ?

## 2022-05-08 MED ORDER — HYDROXYZINE HCL 25 MG PO TABS
25.0000 mg | ORAL_TABLET | Freq: Once | ORAL | Status: AC
  Administered 2022-05-08: 25 mg via ORAL
  Filled 2022-05-08: qty 1

## 2022-05-08 MED ORDER — POLYETHYLENE GLYCOL 3350 17 G PO PACK: 17.0000 g | PACK | Freq: Every day | ORAL | 3 refills | Status: AC

## 2022-05-08 MED ORDER — FUROSEMIDE 20 MG PO TABS: 20.0000 mg | ORAL_TABLET | Freq: Every day | ORAL | 3 refills | Status: AC

## 2022-05-08 MED ORDER — PREDNISONE 20 MG PO TABS: 40.0000 mg | ORAL_TABLET | Freq: Every day | ORAL | 0 refills | Status: AC

## 2022-05-08 MED ORDER — PANTOPRAZOLE SODIUM 40 MG PO TBEC: 40.0000 mg | DELAYED_RELEASE_TABLET | Freq: Two times a day (BID) | ORAL | 3 refills | Status: AC

## 2022-05-08 NOTE — Discharge Summary (Signed)
Tony Sullivan, is a 61 y.o. male  DOB July 07, 1961  MRN [id number].  Admission date:  05/06/2022  Admitting Physician  Murlean Iba, MD  Discharge Date:  05/08/2022   Primary MD  [name], FNP  Recommendations for primary care physician for things to follow:   1)you need oxygen at home at 4 L via nasal cannula continuously while awake and while asleep--- smoking or having open fires around oxygen can cause fire, significant injury and death 2) complete abstinence from tobacco/smoking advised 3)Avoid ibuprofen/Advil/Aleve/Motrin/Goody Powders/Naproxen/BC powders/Meloxicam/Diclofenac/Indomethacin and other Nonsteroidal anti-inflammatory medications as these will make you more likely to bleed and can cause stomach ulcers, can also cause Kidney problems.  4) please note that there has been several changes to your medications 5) please take medications as prescribed 6) please follow-up with a primary care physician within a week for recheck and reevaluation 7) it is very, very important that you follow-up with the oncology team to get your PET scan done as an outpatient to make sure your evaluation for possible lung cancer can be done to see if any chemo or radiation therapy needs to be done this can then be started as soon as possible  Admission Diagnosis  COPD exacerbation ([place]) [J44.1] Acute and chronic respiratory failure with hypoxia ([place]) [J96.21] Acute on chronic respiratory failure with hypoxia ([place]) [J96.21]   Discharge Diagnosis  COPD exacerbation ([place]) [J44.1] Acute and chronic respiratory failure with hypoxia ([place]) [J96.21] Acute on chronic respiratory failure with hypoxia ([place]) [J96.21]    Principal Problem:   Acute and chronic respiratory failure with hypoxia ([place]) Active Problems:   H/o Pulmonary embolism ([facility])   Tobacco abuse   Chronic Cough   COPD with acute exacerbation  ([place])   Essential hypertension   GERD (gastroesophageal reflux disease)   Bipolar disorder ([place])   Non-small cell lung cancer ([place])   Blistered skin right foot/left leg   Acquired thrombophilia ([place])   Pressure injury of skin   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  Started on home O2 12/2013, after found to be desatting at PCP's office, and referred to pulmonology.   Depression    GERD (gastroesophageal reflux disease)    High blood pressure    Prediabetes    Seasonal allergies    Substance abuse ([place])    Tobacco dependence     Past Surgical History:  Procedure Laterality Date   ANTERIOR CERVICAL DECOMP/DISCECTOMY FUSION N/A 09/06/2019   Procedure: Anterior Cervical Discecectomy Fusion - Cerivcal Five-Cervical Six;  Surgeon: Kary Kos, MD;  Location: [facility];  Service: Neurosurgery;  Laterality: N/A;  Anterior Cervical Discecectomy Fusion - Cerivcal Five-Cervical Six   ANTERIOR CERVICAL DISCECTOMY  09/06/2019   APPENDECTOMY  1980   COLONOSCOPY WITH PROPOFOL N/A 12/10/2016   Procedure: COLONOSCOPY  WITH PROPOFOL;  Surgeon: [name], MD;  Location: [facility];  Service: Endoscopy;  Laterality: N/A;   FLEXIBLE BRONCHOSCOPY Left 03/22/2022   Procedure: FLEXIBLE BRONCHOSCOPY;  Surgeon: Rigoberto Noel, MD;  [facility];  Service: Pulmonary;  Laterality: Left;   HIP SURGERY Right    SHOULDER ARTHROSCOPY Right      HPI  from the history and physical done on the day of admission:     HPI: Tony Sullivan is a 61 year old male with a history of COPD, chronic respiratory failure on 4.5 L at home, anxiety/depression, hypertension, pulmonary embolus (03/13/2022 CTA chest) on apixaban, NSCLC diagnosed 03/22/2022 via bronchoscopy (Dr. Elsworth Soho) presenting with worsening shortness of breath over  the past 2 to 3 days.  The patient states that he has not had any air conditioning at his apartment.  It has been over 90 degrees outside.  EMS was activated.  Upon their arrival they noted that the patient's oxygen was not disconnected.  The patient however reports that he does have a home health company supplies oxygen ([employer]), but he ran over his tubing causing the disconnection.  He endorses compliance with his medications.  He continues to smoke up until 1 week prior to this admission.  He denies any fevers, chills, chest pain, nausea, vomiting, diarrhea, abdominal pain.  He still has a small amount of hemoptysis intermittently. In the ED, the patient was afebrile and hemodynamically stable with oxygen saturation 100% 4 L.  WBC 15.7, hemoglobin 13.3, platelets 297,000.  Sodium 142, potassium 5.4, bicarbonate 22, serum creatinine 1.46.  AST 58, ALT 30, alkaline phosphatase 133, total bilirubin 1.2.  The patient was given Solu-Medrol 125 mg, 2 L normal saline, and albuterol nebulizers in the emergency department.  He was admitted for further evaluation and treatment.   Notably, the patient was recently discharged to a skilled nursing facility on 04/14/2022, but he left the same day to go home.   Review of Systems: As mentioned in the history of present illness. All other systems reviewed and are negative.    Hospital Course:     61 year old gentleman longtime smoker with oxygen dependent COPD, chronic respiratory failure on 5 L nasal cannula, home NIV that he is not using regularly, failure to thrive, malnutrition, recently diagnosed NSCLC 03/22/2022 via bronchoscopy, pulmonary embolus diagnosed 03/13/2022 on apixaban, poor compliance, poor self-care and refusal to stay at SNF presented to the emergency department after recently being discharged with similar symptoms of severe shortness of breath, hypoxia, cough, productive sputum and somnolence.  He lives in an apartment that is not air conditioned and  the extreme heat and humidity continues to exacerbate his COPD.  He also is notably noncompliant with using his home NIV despite home visits by respiratory therapy care.  Attempts to define goals of care have been unsuccessful in the past.  His care has been difficult to manage due to him not being cooperative and not participating in care.  EMS found him to be extremely hypoxic with a pulse ox in the 80% range while on supplemental nasal cannula oxygen.  He has been placed on BiPAP therapy.  His ABG revealed a PO2 of 68 with a PCO2 of 62 and pH of 7.41.  His SARS 2 coronavirus test was negative.  He is admitted with acute on chronic respiratory failure.  Assessment and Plan:  1)Acute and Chronic Respiratory Failure with Hypoxia due to COPD exacerbation - Initially required Bipap -Clinically improved on steroids, antibiotics and  bronchodilators -Much improved Dyspnea, -Hypoxia improving-- -currently requiring 4L/min--which is close to patient's based -Discharge home on oxygen, doxycycline, prednisone and bronchodilators -Patient has NIV/trilogy machine at home--- compliance advised -Outpatient follow-up with pulmonologist Dr. [name] advised    2)Pressure injury of skin -- appreciate WOC RN recommendations    3)Blistered skin right foot/left leg - Wounds appear fresh and noninfected, requesting wound care nurse consultation for topical treatments try to avoid getting them infected - Pt says blisters originated from his chair that he is in for many hours of the day   4)Non-small cell lung cancer ([place]) - He still has not followed up with oncology due to recent frequent hospitalizations -- - -Patient again reminded to follow-up with oncology for outpatient PET scan   H/o Pulmonary embolism ([place]) - He is fully anticoagulated with apixaban --compliance advised   Bipolar disorder ([place]) - Resume home medications    GERD (gastroesophageal reflux disease) - Protonix ordered for GI protection    Essential hypertension -Continue PTA meds   Tobacco abuse - Reportedly recently quit tobacco however with his history of poor compliance were not sure how much he is smoking at this time     Code Status: full  Family Communication: None at bedside  Disposition: Home   Consultants:  Oncology   Discharge Condition: Stable  Follow UP--oncology and pulmonology  Diet and Activity recommendation:  As advised  Discharge Instructions     Discharge Instructions     Call MD for:  difficulty breathing, headache or visual disturbances   Complete by: As directed    Call MD for:  persistant dizziness or light-headedness   Complete by: As directed    Call MD for:  persistant nausea and vomiting   Complete by: As directed    Call MD for:  temperature >100.4   Complete by: As directed    Diet - low sodium heart healthy   Complete by: As directed    Discharge instructions   Complete by: As directed    1)you need oxygen at home at 4 L via nasal cannula continuously while awake and while asleep--- smoking or having open fires around oxygen can cause fire, significant injury and death 2) complete abstinence from tobacco/smoking advised 3)Avoid ibuprofen/Advil/Aleve/Motrin/Goody Powders/Naproxen/BC powders/Meloxicam/Diclofenac/Indomethacin and other Nonsteroidal anti-inflammatory medications as these will make you more likely to bleed and can cause stomach ulcers, can also cause Kidney problems.  4) please note that there has been several changes to your medications 5) please take medications as prescribed 6) please follow-up with a primary care physician within a week for recheck and reevaluation 7) it is very, very important that you follow-up with the oncology team to get your PET scan done as an outpatient to make sure your evaluation for possible lung cancer can be done to see if any chemo or radiation therapy needs to be done this can then be started as soon as possible   Discharge  wound care:   Complete by: As directed    As above   Increase activity slowly   Complete by: As directed          Discharge Medications     Allergies as of 05/08/2022       Reactions   Penicillins Anaphylaxis   Has patient had a PCN reaction causing immediate rash, facial/tongue/throat swelling, SOB or lightheadedness with hypotension: Yes Has patient had a PCN reaction causing severe rash involving mucus membranes or skin necrosis: No Has patient had a PCN reaction  that required hospitalization No Has patient had a PCN reaction occurring within the last 10 years: No If all of the above answers are "NO", then may proceed with Cephalosporin use.   Levocetirizine Other (See Comments)   Muscle cramps        Medication List     TAKE these medications    acetaminophen 325 MG tablet Commonly known as: TYLENOL Take 2 tablets (650 mg total) by mouth every 6 (six) hours as needed for mild pain (or Fever >/= 101).   albuterol (2.5 MG/3ML) 0.083% nebulizer solution Commonly known as: PROVENTIL Take 3 mLs (2.5 mg total) by nebulization every 4 (four) hours as needed for wheezing or shortness of breath.   albuterol 108 (90 Base) MCG/ACT inhaler Commonly known as: VENTOLIN HFA Inhale 2 puffs into the lungs every 6 (six) hours as needed for wheezing or shortness of breath.   apixaban 5 MG Tabs tablet Commonly known as: ELIQUIS Take 1 tablet (5 mg total) by mouth 2 (two) times daily.   atorvastatin 40 MG tablet Commonly known as: LIPITOR TAKE ONE TABLET BY MOUTH DAILY   benztropine 0.5 MG tablet Commonly known as: COGENTIN Take 0.5 mg by mouth at bedtime.   Breztri Aerosphere 160-9-4.8 MCG/ACT Aero Generic drug: Budeson-Glycopyrrol-Formoterol Inhale 2 puffs into the lungs in the morning and at bedtime.   dextromethorphan-guaiFENesin 30-600 MG 12hr tablet Commonly known as: MUCINEX DM Take 1 tablet by mouth 2 (two) times daily.   diclofenac Sodium 1 % Gel Commonly  known as: VOLTAREN Apply 2 g topically 4 (four) times daily as needed (pain).   dorzolamide 2 % ophthalmic solution Commonly known as: TRUSOPT Place 1 drop into the left eye 2 (two) times daily.   doxycycline 100 MG tablet Commonly known as: VIBRA-TABS Take 1 tablet (100 mg total) by mouth 2 (two) times daily for 4 days. What changed: when to take this   furosemide 20 MG tablet Commonly known as: LASIX Take 1 tablet (20 mg total) by mouth daily.   gabapentin 100 MG capsule Commonly known as: NEURONTIN Take 1 capsule (100 mg total) by mouth 3 (three) times daily.   hydrOXYzine 25 MG tablet Commonly known as: ATARAX Take 1 tablet (25 mg total) by mouth 3 (three) times daily as needed for anxiety.   latanoprost 0.005 % ophthalmic solution Commonly known as: XALATAN Place 1 drop into both eyes at bedtime.   loxapine 10 MG capsule Commonly known as: LOXITANE Take 1 capsule (10 mg total) by mouth at bedtime.   mirtazapine 30 MG tablet Commonly known as: REMERON Take 1 tablet (30 mg total) by mouth at bedtime.   multivitamin with minerals Tabs tablet Take 1 tablet by mouth daily.   OXYGEN Inhale 4-5 L into the lungs as directed. 4-5 liters with sleep and exertion as needed for low oxygen   pantoprazole 40 MG tablet Commonly known as: PROTONIX Take 1 tablet (40 mg total) by mouth 2 (two) times daily.   polyethylene glycol 17 g packet Commonly known as: MIRALAX / GLYCOLAX Take 17 g by mouth daily.   potassium chloride SA 20 MEQ tablet Commonly known as: KLOR-CON M Take 1 tablet (20 mEq total) by mouth daily.   predniSONE 20 MG tablet Commonly known as: DELTASONE Take 2 tablets (40 mg total) by mouth daily with breakfast for 5 days. What changed:  how much to take how to take this when to take this additional instructions   senna-docusate 8.6-50 MG tablet Commonly known  as: Senokot-S Take 2 tablets by mouth at bedtime. What changed:  when to take this reasons  to take this   simethicone 80 MG chewable tablet Commonly known as: MYLICON Chew 1 tablet (80 mg total) by mouth 4 (four) times daily as needed for flatulence. What changed: when to take this               Discharge Care Instructions  (From admission, onward)           Start     Ordered   05/08/22 0000  Discharge wound care:       Comments: As above   05/08/22 1710            Major procedures and Radiology Reports - PLEASE review detailed and final reports for all details, in brief -    CT CHEST ABDOMEN PELVIS W CONTRAST  Result Date: 05/08/2022 CLINICAL DATA:  Non-small cell lung cancer, acute respiratory failure with hypoxia, failure to thrive, nausea/vomiting EXAM: CT CHEST, ABDOMEN, AND PELVIS WITH CONTRAST TECHNIQUE: Multidetector CT imaging of the chest, abdomen and pelvis was performed following the standard protocol during bolus administration of intravenous contrast. RADIATION DOSE REDUCTION: This exam was performed according to the departmental dose-optimization program which includes automated exposure control, adjustment of the mA and/or kV according to patient size and/or use of iterative reconstruction technique. CONTRAST:  181m OMNIPAQUE IOHEXOL 300 MG/ML  SOLN COMPARISON:  CT chest dated 03/18/2022. CTA chest abdomen pelvis dated 10/17/2021. FINDINGS: CT CHEST FINDINGS Cardiovascular: No evidence of thoracic aortic aneurysm. Mild atherosclerotic calcifications of the aortic arch. The heart is normal in size.  No pericardial effusion. Coronary atherosclerosis of the LAD and left circumflex. Mediastinum/Nodes: No suspicious mediastinal lymphadenopathy. Visualized thyroid is unremarkable. Lungs/Pleura: Endobronchial soft tissue in the proximal left upper lobe bronchus (series 2/image 34) and extending into the central left upper lobe (series 2/image 28), difficult to discretely measure but measuring approximately 4.9 cm in maximal craniocaudal dimension (coronal  image 80) new. This corresponds to the patient's known primary bronchogenic neoplasm and is favored to be mildly progressive. Moderate centrilobular and paraseptal emphysematous changes, upper lung predominant. No focal consolidation. No pleural effusion or pneumothorax. Musculoskeletal: Moderate compression fracture deformity at T5 and T8, grossly unchanged. Mild superior endplate changes at TZ61 Cervical spine fixation hardware, incompletely visualized. CT ABDOMEN PELVIS FINDINGS Hepatobiliary: Liver is within normal limits. No suspicious/enhancing hepatic lesions. Gallbladder is underdistended but unremarkable. No intrahepatic or extrahepatic duct dilatation. Pancreas: Within normal limits. Spleen: Within normal limits. Adrenals/Urinary Tract: Stable 2.1 cm right adrenal nodule (series 2/image 68), unchanged from remote priors, favoring a benign adrenal adenoma. Left adrenal gland is within normal limits. Kidneys are notable for probable early excretory contrast in the collecting systems. No hydronephrosis. Excretory contrast in the bladder. Stomach/Bowel: Mildly distended stomach. No evidence of bowel obstruction. Appendix is not discretely visualized. No colonic wall thickening or inflammatory changes. Vascular/Lymphatic: No evidence of abdominal aortic aneurysm. Atherosclerotic calcifications of the abdominal aorta and branch vessels. No suspicious abdominopelvic lymphadenopathy. Reproductive: Prostate is unremarkable. Other: No abdominopelvic ascites. Musculoskeletal: Moderate compression fracture deformity at L4, unchanged. Mild superior endplate compression fracture deformity at L2 and L5, new. Mild superior endplate changes at L1, unchanged. IMPRESSION: Stable central left upper lobe mass, corresponding to the patient's known primary bronchogenic neoplasm. No findings suspicious for metastatic disease. Stable benign right adrenal adenoma. Multiple thoracolumbar compression fracture deformities, with new  mild compression fracture deformities at L2 and L5. Aortic Atherosclerosis (ICD10-I70.0) and Emphysema (  ICD10-J43.9). Electronically Signed   By: Julian Hy M.D.   On: 05/08/2022 01:08   MR Brain W Wo Contrast  Result Date: 05/07/2022 EXAM: MRI HEAD WITHOUT AND WITH CONTRAST MRA HEAD WITHOUT CONTRAST TECHNIQUE: Multiplanar, multi-echo pulse sequences of the brain and surrounding structures were acquired without and with intravenous contrast. Angiographic images of the Circle of Willis were acquired using MRA technique without intravenous contrast. CONTRAST:  1m GADAVIST GADOBUTROL 1 MMOL/ML IV SOLN COMPARISON:  Prior CT from 10/27/2021. FINDINGS: MRI HEAD FINDINGS Brain: Examination moderately degraded by motion artifact. Cerebral volume within normal limits. Patchy T2/FLAIR hyperintensity involving the periventricular deep white matter both cerebral hemispheres as well as the pons, most consistent with chronic small vessel ischemic disease, mild in nature. No evidence for acute or subacute infarct. Gray-white matter differentiation maintained. No areas of chronic cortical infarction. No acute or chronic intracranial blood products. No mass lesion, midline shift or mass effect no hydrocephalus or extra-axial fluid collection. Pituitary gland suprasellar region normal. No abnormal enhancement. Vascular: Major intracranial vascular flow voids are maintained. Skull and upper cervical spine: Craniocervical junction within normal limits. Bone marrow signal intensity normal. No focal marrow replacing lesion. No scalp soft tissue abnormality. Sinuses/Orbits: Prior bilateral ocular lens replacement. Mild scattered mucosal thickening with a few small retention cysts noted about the ethmoidal air cells and maxillary sinuses. No significant mastoid effusion. Other: None. MRA HEAD FINDINGS Anterior circulation: Examination moderately degraded by motion artifact. Both internal carotid arteries patent to the termini  without significant stenosis or other appreciable abnormality. A1 segments patent bilaterally. Grossly normal anterior communicating artery complex. Both ACAs grossly patent to their distal aspects without appreciable stenosis. No visible M1 stenosis or occlusion. No proximal MCA branch occlusion. Distal MCA branches grossly perfused and symmetric. Posterior circulation: Visualized distal V4 segments patent without stenosis. Right vertebral artery dominant. Neither PICA well visualized. Basilar patent to its distal aspect without stenosis. Superior cerebellar and posterior cerebral arteries patent bilaterally with no appreciable significant stenosis. Anatomic variants: None significant. IMPRESSION: MRI HEAD IMPRESSION: 1. Motion artifact degraded exam. 2. No acute intracranial abnormality. No evidence for intracranial metastatic disease. 3. Mild chronic microvascular ischemic disease. MRA HEAD IMPRESSION: 1. Motion degraded exam. 2. Grossly negative intracranial MRA. No large vessel occlusion, hemodynamically significant stenosis, or other acute vascular abnormality. Electronically Signed   By: BJeannine BogaM.D.   On: 05/07/2022 20:36   MR ANGIO HEAD WO CONTRAST  Result Date: 05/07/2022 EXAM: MRI HEAD WITHOUT AND WITH CONTRAST MRA HEAD WITHOUT CONTRAST TECHNIQUE: Multiplanar, multi-echo pulse sequences of the brain and surrounding structures were acquired without and with intravenous contrast. Angiographic images of the Circle of Willis were acquired using MRA technique without intravenous contrast. CONTRAST:  775mGADAVIST GADOBUTROL 1 MMOL/ML IV SOLN COMPARISON:  Prior CT from 10/27/2021. FINDINGS: MRI HEAD FINDINGS Brain: Examination moderately degraded by motion artifact. Cerebral volume within normal limits. Patchy T2/FLAIR hyperintensity involving the periventricular deep white matter both cerebral hemispheres as well as the pons, most consistent with chronic small vessel ischemic disease, mild in  nature. No evidence for acute or subacute infarct. Gray-white matter differentiation maintained. No areas of chronic cortical infarction. No acute or chronic intracranial blood products. No mass lesion, midline shift or mass effect no hydrocephalus or extra-axial fluid collection. Pituitary gland suprasellar region normal. No abnormal enhancement. Vascular: Major intracranial vascular flow voids are maintained. Skull and upper cervical spine: Craniocervical junction within normal limits. Bone marrow signal intensity normal. No focal marrow replacing lesion.  No scalp soft tissue abnormality. Sinuses/Orbits: Prior bilateral ocular lens replacement. Mild scattered mucosal thickening with a few small retention cysts noted about the ethmoidal air cells and maxillary sinuses. No significant mastoid effusion. Other: None. MRA HEAD FINDINGS Anterior circulation: Examination moderately degraded by motion artifact. Both internal carotid arteries patent to the termini without significant stenosis or other appreciable abnormality. A1 segments patent bilaterally. Grossly normal anterior communicating artery complex. Both ACAs grossly patent to their distal aspects without appreciable stenosis. No visible M1 stenosis or occlusion. No proximal MCA branch occlusion. Distal MCA branches grossly perfused and symmetric. Posterior circulation: Visualized distal V4 segments patent without stenosis. Right vertebral artery dominant. Neither PICA well visualized. Basilar patent to its distal aspect without stenosis. Superior cerebellar and posterior cerebral arteries patent bilaterally with no appreciable significant stenosis. Anatomic variants: None significant. IMPRESSION: MRI HEAD IMPRESSION: 1. Motion artifact degraded exam. 2. No acute intracranial abnormality. No evidence for intracranial metastatic disease. 3. Mild chronic microvascular ischemic disease. MRA HEAD IMPRESSION: 1. Motion degraded exam. 2. Grossly negative intracranial  MRA. No large vessel occlusion, hemodynamically significant stenosis, or other acute vascular abnormality. Electronically Signed   By: Jeannine Boga M.D.   On: 05/07/2022 20:36   DG Chest Port 1 View  Result Date: 05/06/2022 CLINICAL DATA:  Shortness of breath EXAM: PORTABLE CHEST 1 VIEW COMPARISON:  04/29/2022 radiograph and 03/18/2022 chest CT FINDINGS: Chronic hyperinflation and interstitial coarsening. Biapical lucency. Vague rounded density over the right chest correlates with rib fracture callus by CT 2 months ago. Stable appearance of left supra hilum where there is endobronchial mass and airway obstruction by comparison CT. Normal heart size and mediastinal contours. IMPRESSION: COPD without acute superimposed finding. Electronically Signed   By: Jorje Guild M.D.   On: 05/06/2022 04:49   DG Chest Port 1 View  Result Date: 04/29/2022 CLINICAL DATA:  Shortness of breath and hypoxemia. EXAM: PORTABLE CHEST 1 VIEW COMPARISON:  Portable chest 04/22/2022. FINDINGS: The lungs are emphysematous and clear of acute infiltrates. There is scattered linear scarring. Heart size and vasculature are normal with stable mediastinal silhouette. Mild aortic atherosclerosis. Osteopenia. IMPRESSION: Stable COPD chest with no acute cardiopulmonary findings. Electronically Signed   By: Telford Nab M.D.   On: 04/29/2022 03:14   DG Chest Port 1 View  Result Date: 04/22/2022 CLINICAL DATA:  Progressive shortness of breath. Personal history of COPD. EXAM: PORTABLE CHEST 1 VIEW COMPARISON:  One-view chest x-ray 04/12/2022 FINDINGS: Heart size is normal. Changes of COPD are noted. No edema or effusion is present. No focal airspace disease is present. IMPRESSION: 1. COPD. 2. No acute cardiopulmonary disease. Electronically Signed   By: San Morelle M.D.   On: 04/22/2022 14:06   DG Chest Port 1 View  Result Date: 04/12/2022 CLINICAL DATA:  Pain EXAM: PORTABLE CHEST 1 VIEW COMPARISON:  Same day chest  x-ray FINDINGS: Stable cardiomediastinal contours. Chronic emphysematous lung changes. No new focal airspace consolidation, pleural effusion, or pneumothorax. Chronic posterior right sixth rib fracture. IMPRESSION: No active disease. Electronically Signed   By: Davina Poke D.O.   On: 04/12/2022 17:12   DG Chest Port 1 View  Result Date: 04/12/2022 CLINICAL DATA:  61 year old male with history of shortness of breath and chest pain. EXAM: PORTABLE CHEST 1 VIEW COMPARISON:  Chest x-ray 04/06/2022. FINDINGS: Lung volumes are normal. No consolidative airspace disease. No pleural effusions. No pneumothorax. No pulmonary nodule or mass noted. Pulmonary vasculature and the cardiomediastinal silhouette are within normal limits. Atherosclerosis in the  thoracic aorta. Old healed fracture of the posterolateral aspect of the right sixth rib incidentally noted. IMPRESSION: 1. No radiographic evidence of acute cardiopulmonary disease. 2. Aortic atherosclerosis. Electronically Signed   By: Vinnie Langton M.D.   On: 04/12/2022 06:14    Micro Results   Recent Results (from the past 240 hour(s))  Urine Culture     Status: Abnormal   Collection Time: 04/29/22  8:03 AM   Specimen: Urine, Clean Catch  Result Value Ref Range Status   Specimen Description   Final    URINE, CLEAN CATCH Performed at [facility], [address] [place], [place] [address]    Special Requests   Final    NONE Performed at [facility], [address], [place], [place] [address]    Culture MULTIPLE SPECIES PRESENT, SUGGEST RECOLLECTION (A)  Final   Report Status 04/30/2022 FINAL  Final  SARS Coronavirus 2 by RT PCR (hospital order, performed in [facility] hospital lab) *cepheid single result test* Anterior Nasal Swab     Status: None   Collection Time: 05/06/22  7:17 AM   Specimen: Anterior Nasal Swab  Result Value Ref Range Status   SARS Coronavirus 2 by RT PCR NEGATIVE NEGATIVE Final    Comment: (NOTE) SARS-CoV-2 target  nucleic acids are NOT DETECTED.  The SARS-CoV-2 RNA is generally detectable in upper and lower respiratory specimens during the acute phase of infection. The lowest concentration of SARS-CoV-2 viral copies this assay can detect is 250 copies / mL. A negative result does not preclude SARS-CoV-2 infection and should not be used as the sole basis for treatment or other patient management decisions.  A negative result may occur with improper specimen collection / handling, submission of specimen other than nasopharyngeal swab, presence of viral mutation(s) within the areas targeted by this assay, and inadequate number of viral copies (<250 copies / mL). A negative result must be combined with clinical observations, patient history, and epidemiological information.  Fact Sheet for Patients:   https://www.patel.info/  Fact Sheet for Healthcare Providers: https://hall.com/  This test is not yet approved or  cleared by the [place] FDA and has been authorized for detection and/or diagnosis of SARS-CoV-2 by FDA under an Emergency Use Authorization (EUA).  This EUA will remain in effect (meaning this test can be used) for the duration of the COVID-19 declaration under Section 564(b)(1) of the Act, 21 U.S.C. section 360bbb-3(b)(1), unless the authorization is terminated or revoked sooner.  Performed at [facility], [address], [place], [place] [address]     Today   [place] today has no new complaints No fever  Or chills   No Nausea, Vomiting or Diarrhea -- Dyspnea improved -Cough improved significantly    Patient has been seen and examined prior to discharge   Objective   Blood pressure (!) 143/90, pulse 86, temperature 98.6 F (37 C), resp. rate 17, height _0  (1.803 m), weight 54.5 kg, SpO2 98 %.   Intake/Output Summary (Last 24 hours) at 05/08/2022 1711 Last data filed at 05/08/2022 1300 Gross per 24  hour  Intake 720 ml  Output 1600 ml  Net -880 ml   Exam Gen:- Awake Alert, no acute distress , speaking in complete sentences HEENT:- [place].AT, No sclera icterus Nose [place]- 4L/min Neck-Supple Neck,No JVD,.  Lungs-improved air movement, no wheeze  CV- S1, S2 normal, regular Abd-  +ve B.Sounds, Abd Soft, No tenderness,    Extremity/Skin:- No  edema,  good pulses Psych-affect is appropriate, oriented x3 Neuro-no new focal deficits, no tremors    Data Review   CBC w Diff:  Lab Results  Component Value Date   WBC 8.9 05/07/2022   HGB 9.1 (L) 05/07/2022   HCT 30.1 (L) 05/07/2022   PLT 205 05/07/2022   LYMPHOPCT 10 05/06/2022   MONOPCT 13 05/06/2022   EOSPCT 0 05/06/2022   BASOPCT 1 05/06/2022    CMP:  Lab Results  Component Value Date   NA 140 05/07/2022   NA 144 03/17/2020   K 3.5 05/07/2022   CL 100 05/07/2022   CO2 32 05/07/2022   BUN 21 (H) 05/07/2022   BUN 11 03/17/2020   CREATININE 0.66 05/07/2022   PROT 6.0 (L) 04/29/2022   ALBUMIN 2.8 (L) 04/29/2022   BILITOT 1.2 04/29/2022   ALKPHOS 133 (H) 04/29/2022   AST 58 (H) 04/29/2022   ALT 38 04/29/2022    Total Discharge time is about 33 minutes  [name] M.D on 05/08/2022 at 5:11 PM  Go to [email or web address] -  for contact info  [facility] - Office  [phone]

## 2022-05-08 NOTE — TOC Progression Note (Signed)
Transition of Care [facility]) - Progression Note    Patient Details  Name: Tony Sullivan MRN: [id number] Date of Birth: 1961-02-28  Transition of Care [facility]) CM/SW Contact  Salome Arnt, [place] Phone Number: 05/08/2022, 10:59 AM  Clinical Narrative:  LCSW met with pt along with MD. Pt aware of plans to d/c today. MD discussed importance of pt having PET scan completed. Pt called someone who can be at home after 5:00 today and pt states he will need EMS. RN notified. Pt has home O2 and NIV.      Expected Discharge Plan: Home/Self Care Barriers to Discharge: Barriers Resolved  Expected Discharge Plan and Services Expected Discharge Plan: Home/Self Care In-house Referral: Clinical Social Work     Living arrangements for the past 2 months: Single Family Home                                       Social Determinants of Health (SDOH) Interventions    Readmission Risk Interventions    05/06/2022   11:49 AM 05/01/2022    7:51 AM 03/14/2022    3:54 PM  Readmission Risk Prevention Plan  Transportation Screening Complete Complete Complete  Medication Review Press photographer) Complete Complete Complete  PCP or Specialist appointment within 3-5 days of discharge   Not Complete  HRI or [place] Complete Complete Complete  SW Recovery Care/Counseling Consult Complete Complete Complete  Palliative Care Screening Not Applicable Not Applicable Not [place] Not Applicable Not Applicable Patient Refused

## 2022-05-08 NOTE — Progress Notes (Addendum)
This nurse was called to pt's room with c/o feeling tightness in his chest. O2 sat is 100% and no visible signs of distress noted. Pt states, " I need my inhaler and it would feel better. I coughed some up but I still feel tight." MD notified. See new orders.

## 2022-05-08 NOTE — Discharge Instructions (Signed)
1)you need oxygen at home at 4 L via nasal cannula continuously while awake and while asleep--- smoking or having open fires around oxygen can cause fire, significant injury and death 2) complete abstinence from tobacco/smoking advised 3)Avoid ibuprofen/Advil/Aleve/Motrin/Goody Powders/Naproxen/BC powders/Meloxicam/Diclofenac/Indomethacin and other Nonsteroidal anti-inflammatory medications as these will make you more likely to bleed and can cause stomach ulcers, can also cause Kidney problems.  4) please note that there has been several changes to your medications 5) please take medications as prescribed 6) please follow-up with a primary care physician within a week for recheck and reevaluation 7) it is very, very important that you follow-up with the oncology team to get your PET scan done as an outpatient to make sure your evaluation for possible lung cancer can be done to see if any chemo or radiation therapy needs to be done this can then be started as soon as possible

## 2022-05-09 NOTE — Progress Notes (Signed)
Dressing changed to bilateral lower legs per order. Patient reported no complaints during dressing change.

## 2022-05-09 NOTE — Progress Notes (Signed)
Patient discharged home today, transported home by EMS. Discharge paperwork went over with patient, patient verbalized understanding. Belongings sent home with patient including wound supplies.

## 2022-05-09 NOTE — Progress Notes (Signed)
Pt refused wound care. Wraps at R foot and LLE are clean and intact. Pt stated "Just leave me alone or come back later." Charge nurse aware. Will continue to  monitor.

## 2022-05-09 NOTE — Progress Notes (Signed)
EMS arrived 0113 to transport pt home. Charge nurse could not get in touch with someone at residence. Pt stated "I don't know if anyone will be home at this time. I know there will be someone there at 0830 05/09/2022." EMS stated they would put transport back on board for appropriate time for pt transport.

## 2022-05-09 NOTE — Progress Notes (Signed)
Pt was dced home on 05/08/22 - Patient states that he was unable to leave yesterday because EMS transport arrived late -- -Seen, interviewed and examined again on 05/09/2022 - - No new concerns today -Patient remains medically stable for discharge home today Please see full dc summary dated 05/08/22  .[name], MD

## 2022-05-09 NOTE — TOC Progression Note (Signed)
Transition of Care [facility]) - Progression Note    Patient Details  Name: OLNEY MONIER MRN: [id number] Date of Birth: 29-Nov-1960  [facility]) CM/SW Contact  Shade Flood, LCSW Phone Number: 05/09/2022, 11:10 AM  Clinical Narrative:     Pt did not dc yesterday when EMS arrived after 1AM (today). TOC called EMS to have pt placed back on the list for transport today.  Expected Discharge Plan: Home/Self Care Barriers to Discharge: Barriers Resolved  Expected Discharge Plan and Services Expected Discharge Plan: Home/Self Care In-house Referral: Clinical Social Work     Living arrangements for the past 2 months: Single Family Home Expected Discharge Date: 05/09/22                                     Social Determinants of Health (SDOH) Interventions    Readmission Risk Interventions    05/06/2022   11:49 AM 05/01/2022    7:51 AM 03/14/2022    3:54 PM  Readmission Risk Prevention Plan  Transportation Screening Complete Complete Complete  Medication Review Press photographer) Complete Complete Complete  PCP or Specialist appointment within 3-5 days of discharge   Not Complete  HRI or [place] Complete Complete Complete  SW Recovery Care/Counseling Consult Complete Complete Complete  Palliative Care Screening Not Applicable Not Applicable Not [place] Not Applicable Not Applicable Patient Refused

## 2022-05-09 NOTE — Progress Notes (Signed)
Went in twice to help turn and reposition patient in bed and each time patient refused. States that he is able to move self in bed and he turns on his side at times.

## 2022-05-10 LAB — ACID FAST CULTURE WITH REFLEXED SENSITIVITIES (MYCOBACTERIA): Acid Fast Culture: NEGATIVE

## 2022-05-14 ENCOUNTER — Ambulatory Visit (HOSPITAL_COMMUNITY): Payer: Medicaid Other | Admitting: Hematology

## 2022-05-15 ENCOUNTER — Emergency Department (HOSPITAL_COMMUNITY): Payer: Medicaid Other

## 2022-05-15 ENCOUNTER — Other Ambulatory Visit: Payer: Self-pay

## 2022-05-15 ENCOUNTER — Encounter (HOSPITAL_COMMUNITY): Payer: Self-pay | Admitting: Emergency Medicine

## 2022-05-15 ENCOUNTER — Emergency Department (HOSPITAL_COMMUNITY)
Admission: EM | Admit: 2022-05-15 | Discharge: 2022-05-16 | Disposition: A | Discharge status: Home / Self Care | Payer: Medicaid Other | Attending: Emergency Medicine | Admitting: Emergency Medicine

## 2022-05-15 DIAGNOSIS — Z7901 Long term (current) use of anticoagulants: Secondary | ICD-10-CM | POA: Insufficient documentation

## 2022-05-15 DIAGNOSIS — X58XXXA Exposure to other specified factors, initial encounter: Secondary | ICD-10-CM | POA: Diagnosis not present

## 2022-05-15 DIAGNOSIS — S90822A Blister (nonthermal), left foot, initial encounter: Secondary | ICD-10-CM | POA: Insufficient documentation

## 2022-05-15 DIAGNOSIS — L089 Local infection of the skin and subcutaneous tissue, unspecified: Secondary | ICD-10-CM

## 2022-05-15 DIAGNOSIS — R059 Cough, unspecified: Secondary | ICD-10-CM | POA: Insufficient documentation

## 2022-05-15 DIAGNOSIS — R531 Weakness: Secondary | ICD-10-CM | POA: Diagnosis not present

## 2022-05-15 DIAGNOSIS — R4182 Altered mental status, unspecified: Secondary | ICD-10-CM | POA: Insufficient documentation

## 2022-05-15 DIAGNOSIS — R042 Hemoptysis: Secondary | ICD-10-CM | POA: Diagnosis not present

## 2022-05-15 DIAGNOSIS — Z7951 Long term (current) use of inhaled steroids: Secondary | ICD-10-CM | POA: Insufficient documentation

## 2022-05-15 DIAGNOSIS — J449 Chronic obstructive pulmonary disease, unspecified: Secondary | ICD-10-CM | POA: Insufficient documentation

## 2022-05-15 DIAGNOSIS — S90821A Blister (nonthermal), right foot, initial encounter: Secondary | ICD-10-CM | POA: Diagnosis present

## 2022-05-15 LAB — CBC WITH DIFFERENTIAL/PLATELET
Abs Immature Granulocytes: 0.39 10*3/uL — ABNORMAL HIGH (ref 0.00–0.07)
Basophils Absolute: 0.1 10*3/uL (ref 0.0–0.1)
Basophils Relative: 1 %
Eosinophils Absolute: 0 10*3/uL (ref 0.0–0.5)
Eosinophils Relative: 0 %
HCT: 37.7 % — ABNORMAL LOW (ref 39.0–52.0)
Hemoglobin: 10.7 g/dL — ABNORMAL LOW (ref 13.0–17.0)
Immature Granulocytes: 4 %
Lymphocytes Relative: 9 %
Lymphs Abs: 0.9 10*3/uL (ref 0.7–4.0)
MCH: 30.5 pg (ref 26.0–34.0)
MCHC: 28.4 g/dL — ABNORMAL LOW (ref 30.0–36.0)
MCV: 107.4 fL — ABNORMAL HIGH (ref 80.0–100.0)
Monocytes Absolute: 0.7 10*3/uL (ref 0.1–1.0)
Monocytes Relative: 7 %
Neutro Abs: 7.6 10*3/uL (ref 1.7–7.7)
Neutrophils Relative %: 79 %
Platelets: 222 10*3/uL (ref 150–400)
RBC: 3.51 MIL/uL — ABNORMAL LOW (ref 4.22–5.81)
RDW: 16.4 % — ABNORMAL HIGH (ref 11.5–15.5)
WBC: 9.6 10*3/uL (ref 4.0–10.5)
nRBC: 0 % (ref 0.0–0.2)

## 2022-05-15 LAB — COMPREHENSIVE METABOLIC PANEL
ALT: 20 U/L (ref 0–44)
AST: 19 U/L (ref 15–41)
Albumin: 2.3 g/dL — ABNORMAL LOW (ref 3.5–5.0)
Alkaline Phosphatase: 164 U/L — ABNORMAL HIGH (ref 38–126)
Anion gap: 7 (ref 5–15)
BUN: 16 mg/dL (ref 6–20)
CO2: 31 mmol/L (ref 22–32)
Calcium: 8.7 mg/dL — ABNORMAL LOW (ref 8.9–10.3)
Chloride: 106 mmol/L (ref 98–111)
Creatinine, Ser: 0.61 mg/dL (ref 0.61–1.24)
GFR, Estimated: >60 mL/min (ref >60)
Glucose, Bld: 92 mg/dL (ref 70–99)
Potassium: 4.3 mmol/L (ref 3.5–5.1)
Sodium: 144 mmol/L (ref 135–145)
Total Bilirubin: 0.5 mg/dL (ref 0.3–1.2)
Total Protein: 5.4 g/dL — ABNORMAL LOW (ref 6.5–8.1)

## 2022-05-15 LAB — TROPONIN I (HIGH SENSITIVITY)
Troponin I (High Sensitivity): 13 ng/L (ref <18)
Troponin I (High Sensitivity): 13 ng/L (ref <18)

## 2022-05-15 MED ORDER — LORAZEPAM 0.5 MG PO TABS
0.5000 mg | ORAL_TABLET | Freq: Once | ORAL | Status: AC
  Administered 2022-05-15: 0.5 mg via ORAL
  Filled 2022-05-15: qty 1

## 2022-05-15 MED ORDER — DOXYCYCLINE HYCLATE 100 MG PO CAPS: 100.0000 mg | ORAL_CAPSULE | Freq: Two times a day (BID) | ORAL | 0 refills | Status: DC

## 2022-05-15 MED ORDER — TRIPLE ANTIBIOTIC 3.5-400-5000 EX OINT
1.0000 | TOPICAL_OINTMENT | Freq: Once | CUTANEOUS | Status: AC
  Administered 2022-05-15: 1 via CUTANEOUS
  Filled 2022-05-15: qty 3

## 2022-05-15 MED ORDER — BACITRACIN ZINC 500 UNIT/GM EX OINT: 1.0000 | TOPICAL_OINTMENT | Freq: Two times a day (BID) | CUTANEOUS | 0 refills | Status: DC

## 2022-05-15 MED ORDER — IOHEXOL 350 MG/ML SOLN
100.0000 mL | Freq: Once | INTRAVENOUS | Status: AC | PRN
  Administered 2022-05-15: 75 mL via INTRAVENOUS

## 2022-05-15 NOTE — ED Triage Notes (Signed)
Pt BIB [facility] from home with reports "throwing up blood" that started yesterday morning.

## 2022-05-15 NOTE — ED Notes (Signed)
Applied ointment and dressed wounds to pt legs and foot.

## 2022-05-15 NOTE — Discharge Instructions (Addendum)
We have not found any indication to admit to to the hospital today, however we have ordered further assistance in your home including possible home health aide and physical therapy to help you better able to care for yourself in your home.  Expect that he will receive communication from the hospital within the next several days to arrange an appointment in your home to further assess ways to keep you safe and cared for in your home.  You have been prescribed antibiotics for your wounds on your legs.  Take your next dose of the antibiotic tomorrow morning, this is a tablet.  You have also been prescribed antibiotic ointment which I want you to apply to the wounds on your legs twice a day and then cover the sites with bandages.  Wash your legs with mild soapy water before applying the next dose of the antibiotic ointment.  Call your primary provider Ms. Anderson for recheck appointment in her office within the next several days.  It will also be important for you to follow-up with Dr. Delton Coombes so you can proceed with your lung cancer treatment.

## 2022-05-15 NOTE — ED Provider Notes (Signed)
[name]   CSN: [id number] Arrival date & time: 05/15/22  1149     History  Chief Complaint  Patient presents with   Hematemesis    Tony Sullivan is a 61 y.o. 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 Delton Coombes but has missed several appointments therefore has not started treatment for this condition yet, but presenting today for evaluation of hemoptysis which started yesterday morning.  He describes coughing up thick and bloody sputum since yesterday morning (although presents with a liquid dark pink sample which he states is sputum in a jar).  He denies fevers or chills, he does have midsternal chest pain which is worsened with cough and better when not coughing, increased shortness of breath.  Denies lower extremity edema.  He does have several large wounds on his right lateral foot and his left medial lower leg which he states were blisters that rubbed from his chair, are now tender and draining yellow to green fluid.  He has been wrapping these wounds with dressing, no other treatments have been tried.  He denies abdominal pain, nausea or vomiting, denies hematemesis, no rectal bleeding.  6:30 PM  Additional information obtained,  pt reporting for the past 3-4 weeks he has been having "spells" which he thinks may be seizures.  He describes 1-2 times per week, he will notice his head shaking, then he wakes about 1-2 minutes later to find he has urinated.  He denies confusion after these events.  Last episode occurred last night when he was lying in his bed (but not sleeping).    Denies headache. No fevers, no neck pain or stiffness, denies other focal weakness.  Pt denies substance abuse,  used to drink etoh and smoke marijuana, no use in the past 7-8 months.       The history is provided by the patient.       Home Medications Prior to Admission medications   Medication Sig Start Date End Date Taking? Authorizing Provider  bacitracin ointment Apply 1 Application topically 2 (two) times daily. 05/15/22  Yes Etheleen Valtierra, Almyra Free, PA-C  doxycycline (VIBRAMYCIN) 100 MG capsule Take 1 capsule (100 mg total) by mouth 2 (two) times daily. 05/15/22  Yes Keagan Anthis, Almyra Free, PA-C  acetaminophen (TYLENOL) 325 MG tablet Take 2 tablets (650 mg total) by mouth every 6 (six) hours as needed for mild pain (or Fever >/= 101). 10/30/21   [name], MD  albuterol (PROVENTIL) (2.5 MG/3ML) 0.083% nebulizer solution Take 3 mLs (2.5 mg total) by nebulization every 4 (four) hours as needed for wheezing or shortness of breath. 05/08/22 05/08/23  [name], MD  albuterol (VENTOLIN HFA) 108 (90 Base) MCG/ACT inhaler Inhale 2 puffs into the lungs every 6 (six) hours as needed for wheezing or shortness of breath. 05/08/22   [name], MD  apixaban (ELIQUIS) 5 MG TABS tablet Take 1 tablet (5 mg total) by mouth 2 (two) times daily. 05/08/22 08/06/22  [name], MD  atorvastatin (LIPITOR) 40 MG tablet TAKE ONE TABLET BY MOUTH DAILY Patient taking differently: Take 40 mg by mouth daily. 07/06/20   [name], MD  benztropine (COGENTIN) 0.5 MG tablet Take 0.5 mg by mouth at bedtime.    [provider]  Budeson-Glycopyrrol-Formoterol (BREZTRI AEROSPHERE) 160-9-4.8 MCG/ACT AERO  Inhale 2 puffs into the lungs in the morning and at bedtime. 05/08/22   [name], MD  dextromethorphan-guaiFENesin (MUCINEX DM) 30-600 MG 12hr tablet Take 1 tablet by mouth 2 (two) times daily. 05/02/22   Barton Dubois, MD  diclofenac Sodium (VOLTAREN) 1 % GEL Apply 2 g topically 4 (four) times daily as needed (pain).    [provider]  dorzolamide (TRUSOPT) 2 % ophthalmic solution Place 1 drop into the left eye 2 (two) times daily. 12/12/20   [provider]   furosemide (LASIX) 20 MG tablet Take 1 tablet (20 mg total) by mouth daily. 05/08/22   [name], MD  gabapentin (NEURONTIN) 100 MG capsule Take 1 capsule (100 mg total) by mouth 3 (three) times daily. 05/08/22 07/07/22  [name], MD  hydrOXYzine (ATARAX) 25 MG tablet Take 1 tablet (25 mg total) by mouth 3 (three) times daily as needed for anxiety. 05/08/22   Emokpae, Courage, MD  latanoprost (XALATAN) 0.005 % ophthalmic solution Place 1 drop into both eyes at bedtime. 05/24/19   [provider]  loxapine (LOXITANE) 10 MG capsule Take 1 capsule (10 mg total) by mouth at bedtime. 05/08/22   [name], MD  mirtazapine (REMERON) 30 MG tablet Take 1 tablet (30 mg total) by mouth at bedtime. 05/08/22   [name], MD  Multiple Vitamin (MULTIVITAMIN WITH MINERALS) TABS tablet Take 1 tablet by mouth daily. 05/08/22   [name], MD  OXYGEN Inhale 4-5 L into the lungs as directed. 4-5 liters with sleep and exertion as needed for low oxygen    [provider]  pantoprazole (PROTONIX) 40 MG tablet Take 1 tablet (40 mg total) by mouth 2 (two) times daily. 05/08/22   [name], MD  polyethylene glycol (MIRALAX / GLYCOLAX) 17 g packet Take 17 g by mouth daily. 05/08/22   [name], MD  potassium chloride SA (KLOR-CON M) 20 MEQ tablet Take 1 tablet (20 mEq total) by mouth daily. 05/08/22   [name], MD  senna-docusate (SENOKOT-S) 8.6-50 MG tablet Take 2 tablets by mouth at bedtime. Patient taking differently: Take 2 tablets by mouth at bedtime as needed for mild constipation. 10/30/21   [name], MD  simethicone (MYLICON) 80 MG chewable tablet Chew 1 tablet (80 mg total) by mouth 4 (four) times daily as needed for flatulence. Patient taking differently: Chew 80 mg by mouth every 6 (six) hours as needed for flatulence. 03/15/22   [name], Pratik D, DO      Allergies    Penicillins and Levocetirizine    Review of Systems   Review of Systems   Constitutional:  Negative for chills and fever.  HENT:  Negative for congestion and sore throat.   Eyes: Negative.   Respiratory:  Positive for cough and shortness of breath. Negative for chest tightness and wheezing.   Cardiovascular:  Negative for chest pain.  Gastrointestinal:  Negative for abdominal pain, blood in stool, nausea and vomiting.  Genitourinary:  Positive for enuresis.  Musculoskeletal:  Negative for arthralgias, joint swelling and neck pain.  Skin:  Positive for wound. Negative for rash.  Neurological:  Negative for dizziness, weakness, light-headedness, numbness and headaches.  Psychiatric/Behavioral: Negative.      Physical Exam Updated Vital Signs BP (!) 162/93   Pulse 89   Resp (!) 21   SpO2 97%  Physical Exam Vitals and nursing note reviewed.  Constitutional:      Appearance: He is well-developed.  HENT:     Head: Normocephalic  and atraumatic.     Mouth/Throat:     Mouth: Mucous membranes are dry.     Pharynx: Oropharynx is clear.  Cardiovascular:     Rate and Rhythm: Normal rate and regular rhythm.     Heart sounds: Normal heart sounds.  Pulmonary:     Effort: Pulmonary effort is normal. No respiratory distress.     Breath sounds: Normal breath sounds. Decreased air movement present. No wheezing, rhonchi or rales.     Comments: Reduced breath sounds throughout all lung [place]. Chest:     Chest wall: No tenderness.  Abdominal:     General: Bowel sounds are normal. There is no distension.     Palpations: Abdomen is soft.     Tenderness: There is no abdominal tenderness.  Musculoskeletal:        General: Normal range of motion.     Cervical back: Normal range of motion.  Skin:    General: Skin is warm and dry.     Comments: Large sloughed blisters right lateral foot and left medial lower leg - the left has healthy appearing granulation tissue with no surrounding erythema or purulent drainage.  The right foot lesion had dark, green thick tenacious  exudate on the dirty dressing.  Minimal surrounding erythema, no induration or fluctuance.    Neurological:     Mental Status: He is alert.     Cranial Nerves: Cranial nerves 2-12 are intact.     Motor: Motor function is intact.     ED Results / Procedures / Treatments   Labs (all labs ordered are listed, but only abnormal results are displayed) Labs Reviewed  CBC WITH DIFFERENTIAL/PLATELET - Abnormal; Notable for the following components:      Result Value   RBC 3.51 (*)    Hemoglobin 10.7 (*)    HCT 37.7 (*)    MCV 107.4 (*)    MCHC 28.4 (*)    RDW 16.4 (*)    Abs Immature Granulocytes 0.39 (*)    All other components within normal limits  COMPREHENSIVE METABOLIC PANEL - Abnormal; Notable for the following components:   Calcium 8.7 (*)    Total Protein 5.4 (*)    Albumin 2.3 (*)    Alkaline Phosphatase 164 (*)    All other components within normal limits  URINALYSIS, ROUTINE W REFLEX MICROSCOPIC  TROPONIN I (HIGH SENSITIVITY)  TROPONIN I (HIGH SENSITIVITY)    EKG None  Radiology CT Angio Chest PE W and/or Wo Contrast  Result Date: 05/15/2022 CLINICAL DATA:  Hemoptysis since yesterday morning, non-small cell lung cancer EXAM: CT ANGIOGRAPHY CHEST WITH CONTRAST TECHNIQUE: Multidetector CT imaging of the chest was performed using the standard protocol during bolus administration of intravenous contrast. Multiplanar CT image reconstructions and MIPs were obtained to evaluate the vascular anatomy. RADIATION DOSE REDUCTION: This exam was performed according to the departmental dose-optimization program which includes automated exposure control, adjustment of the mA and/or kV according to patient size and/or use of iterative reconstruction technique. CONTRAST:  44mL OMNIPAQUE IOHEXOL 350 MG/ML SOLN COMPARISON:  05/07/2022 FINDINGS: Cardiovascular: This is a technically adequate evaluation of the pulmonary vasculature. There are no filling defects or pulmonary emboli. The heart is  unremarkable without pericardial effusion. No evidence of thoracic aortic aneurysm or dissection. Stable atherosclerosis of the aorta and coronary vasculature. Mediastinum/Nodes: No enlarged mediastinal, hilar, or axillary lymph nodes. Thyroid gland, trachea, and esophagus demonstrate no significant findings. Lungs/Pleura: No significant change in the large endobronchial lesion occupying the majority  of the left upper lobe bronchial tree. Severe upper lobe predominant emphysema is again noted. No acute airspace disease. Trace left pleural effusion. No pneumothorax. Upper Abdomen: Stable nodular right adrenal thickening measuring 2 x 1 cm, mean attenuation -3 HU, most consistent with adenoma. No acute upper abdominal findings. Musculoskeletal: No acute or destructive bony lesions. Multiple prior healed bilateral rib fractures. Chronic compression deformities at T5, T7, T8, T11, and T12. Reconstructed images demonstrate no additional findings. Review of the MIP images confirms the above findings. IMPRESSION: 1. No evidence of pulmonary embolus. 2. Stable left upper lobe endobronchial mass consistent with known lung cancer. 3. Trace left pleural effusion. 4. Aortic Atherosclerosis (ICD10-I70.0) and Emphysema (ICD10-J43.9). 5. Stable right adrenal nodule, previously characterized as adenoma. Electronically Signed   By: Randa Ngo M.D.   On: 05/15/2022 18:04   CT Head Wo Contrast  Result Date: 05/15/2022 CLINICAL DATA:  Mental status change. EXAM: CT HEAD WITHOUT CONTRAST TECHNIQUE: Contiguous axial images were obtained from the base of the skull through the vertex without intravenous contrast. RADIATION DOSE REDUCTION: This exam was performed according to the departmental dose-optimization program which includes automated exposure control, adjustment of the mA and/or kV according to patient size and/or use of iterative reconstruction technique. COMPARISON:  None Available. FINDINGS: Brain: No evidence of acute  infarction, hemorrhage, hydrocephalus, extra-axial collection or mass lesion/mass effect. Vascular: No hyperdense vessel or unexpected calcification. Skull: Normal. Negative for fracture or focal lesion. Sinuses/Orbits: Minimal polypoid thickening in the maxillary sinuses Other: None. IMPRESSION: No acute intracranial abnormality. Electronically Signed   By: Fidela Salisbury M.D.   On: 05/15/2022 18:02   DG Chest Portable 1 View  Result Date: 05/15/2022 CLINICAL DATA:  Hemoptysis.  Pain. EXAM: PORTABLE CHEST 1 VIEW COMPARISON:  05/07/2022 CT and 05/06/2022 chest x-ray FINDINGS: Heart size is normal. There is perihilar peribronchial thickening. No new consolidations or pleural effusions. No evidence for pulmonary edema. Remote RIGHT posterior 6th rib fracture. IMPRESSION: No evidence for acute abnormality. Chronic bronchitic changes. Electronically Signed   By: Nolon Nations M.D.   On: 05/15/2022 13:48    Procedures Procedures    Medications Ordered in ED Medications  neomycin-bacitracin-polymyxin [id number] OINT 1 Application (has no administration in time range)  LORazepam (ATIVAN) tablet 0.5 mg (0.5 mg Oral Given 05/15/22 1454)  iohexol (OMNIPAQUE) 350 MG/ML injection 100 mL (75 mLs Intravenous Contrast Given 05/15/22 1758)    ED Course/ Medical Decision Making/ A&P                           Medical Decision Making Pt presenting with multiple complaints, the primary being hemoptysis, CT angio is negative for acute pulmonary embolism, there is no obvious active bleeding from the tumor site as well.  He has had no further symptoms of hemoptysis while here.  On further conversation he does state he has had 2 prior episodes of similar symptoms which she states generally last for about 24 hours and then resolved.  This may be the case today.  He was encouraged close follow-up with Dr. Delton Coombes for continuation of his evaluation and treatment plan for this known lung cancer.  The wounds on  his legs were dressed, antibiotic ointment applied.  He will also be placed on antibiotics.  Doxycycline started.  Patient expressed desire to return to assisted living for rehab, he was admitted to a rehab facility where he left after 8 hours as he felt he was not getting  appropriate care.  There is no indication at this time from a medical standpoint that he would need to stay for such placement however an ambulatory referral has been made for  home health face to face consult for home assistance.    Amount and/or Complexity of Data Reviewed Labs: ordered. Radiology: ordered.  Risk Prescription drug management.           Final Clinical Impression(s) / ED Diagnoses Final diagnoses:  Cough with hemoptysis  Blisters of multiple sites, infected  Weakness    Rx / DC Orders ED Discharge Orders          Ordered    bacitracin ointment  2 times daily        05/15/22 1821    doxycycline (VIBRAMYCIN) 100 MG capsule  2 times daily        05/15/22 [place]        05/15/22 1827    Face-to-face encounter (required for Medicare/Medicaid patients)       Comments: I Evalee Jefferson certify that this patient is under my care and that I, or a nurse practitioner or physician's assistant working with me, had a face-to-face encounter that meets the physician face-to-face encounter requirements with this patient on 05/15/2022. The encounter with the patient was in whole, or in part for the following medical condition(s) which is the primary reason for home health care (List medical condition): shortness of breath on oxygen therapy.  Increasing difficulty taking care of ADL's.   05/15/22 1827              Evalee Jefferson, PA-C 05/15/22 [place], MD 05/16/22 1505

## 2022-05-16 NOTE — ED Notes (Signed)
Pt given crackers and a mixed fruit cup

## 2022-05-16 NOTE — ED Notes (Signed)
MD aware that this RN cannot view EKG & UA has not been completed, per Ashok Cordia, MD the pt has been cleared for discharge and is to move forward with transport home with AVS summary, pt verbalizes understanding of plan of care

## 2022-05-20 ENCOUNTER — Ambulatory Visit (HOSPITAL_COMMUNITY): Payer: Medicaid Other | Admitting: Hematology

## 2022-05-21 ENCOUNTER — Inpatient Hospital Stay (HOSPITAL_COMMUNITY)
Admission: EM | Admit: 2022-05-21 | Discharge: 2022-05-28 | DRG: 190 | Disposition: A | Discharge status: Skilled Nursing Facility | Payer: Medicaid Other | Attending: Internal Medicine | Admitting: Internal Medicine

## 2022-05-21 ENCOUNTER — Encounter (HOSPITAL_COMMUNITY): Payer: Self-pay

## 2022-05-21 ENCOUNTER — Other Ambulatory Visit: Payer: Self-pay

## 2022-05-21 ENCOUNTER — Observation Stay (HOSPITAL_COMMUNITY): Payer: Medicaid Other

## 2022-05-21 ENCOUNTER — Emergency Department (HOSPITAL_COMMUNITY): Payer: Medicaid Other

## 2022-05-21 DIAGNOSIS — F319 Bipolar disorder, unspecified: Secondary | ICD-10-CM | POA: Diagnosis present

## 2022-05-21 DIAGNOSIS — Z825 Family history of asthma and other chronic lower respiratory diseases: Secondary | ICD-10-CM | POA: Diagnosis not present

## 2022-05-21 DIAGNOSIS — Z720 Tobacco use: Secondary | ICD-10-CM | POA: Diagnosis not present

## 2022-05-21 DIAGNOSIS — F17210 Nicotine dependence, cigarettes, uncomplicated: Secondary | ICD-10-CM | POA: Diagnosis present

## 2022-05-21 DIAGNOSIS — Z888 Allergy status to other drugs, medicaments and biological substances status: Secondary | ICD-10-CM

## 2022-05-21 DIAGNOSIS — J9611 Chronic respiratory failure with hypoxia: Secondary | ICD-10-CM

## 2022-05-21 DIAGNOSIS — J9622 Acute and chronic respiratory failure with hypercapnia: Secondary | ICD-10-CM | POA: Diagnosis present

## 2022-05-21 DIAGNOSIS — Z7189 Other specified counseling: Secondary | ICD-10-CM | POA: Diagnosis not present

## 2022-05-21 DIAGNOSIS — F1010 Alcohol abuse, uncomplicated: Secondary | ICD-10-CM | POA: Diagnosis present

## 2022-05-21 DIAGNOSIS — R238 Other skin changes: Secondary | ICD-10-CM | POA: Diagnosis present

## 2022-05-21 DIAGNOSIS — L89150 Pressure ulcer of sacral region, unstageable: Secondary | ICD-10-CM | POA: Diagnosis present

## 2022-05-21 DIAGNOSIS — Z9981 Dependence on supplemental oxygen: Secondary | ICD-10-CM

## 2022-05-21 DIAGNOSIS — Z515 Encounter for palliative care: Secondary | ICD-10-CM | POA: Diagnosis not present

## 2022-05-21 DIAGNOSIS — Z91199 Patient's noncompliance with other medical treatment and regimen due to unspecified reason: Secondary | ICD-10-CM

## 2022-05-21 DIAGNOSIS — J441 Chronic obstructive pulmonary disease with (acute) exacerbation: Secondary | ICD-10-CM | POA: Diagnosis present

## 2022-05-21 DIAGNOSIS — L89899 Pressure ulcer of other site, unspecified stage: Secondary | ICD-10-CM | POA: Diagnosis not present

## 2022-05-21 DIAGNOSIS — J9621 Acute and chronic respiratory failure with hypoxia: Secondary | ICD-10-CM | POA: Diagnosis present

## 2022-05-21 DIAGNOSIS — K219 Gastro-esophageal reflux disease without esophagitis: Secondary | ICD-10-CM | POA: Diagnosis present

## 2022-05-21 DIAGNOSIS — R54 Age-related physical debility: Secondary | ICD-10-CM | POA: Diagnosis present

## 2022-05-21 DIAGNOSIS — F419 Anxiety disorder, unspecified: Secondary | ICD-10-CM | POA: Diagnosis present

## 2022-05-21 DIAGNOSIS — Z993 Dependence on wheelchair: Secondary | ICD-10-CM

## 2022-05-21 DIAGNOSIS — M549 Dorsalgia, unspecified: Secondary | ICD-10-CM | POA: Diagnosis present

## 2022-05-21 DIAGNOSIS — C3412 Malignant neoplasm of upper lobe, left bronchus or lung: Secondary | ICD-10-CM | POA: Diagnosis present

## 2022-05-21 DIAGNOSIS — C3490 Malignant neoplasm of unspecified part of unspecified bronchus or lung: Secondary | ICD-10-CM | POA: Diagnosis present

## 2022-05-21 DIAGNOSIS — Z7401 Bed confinement status: Secondary | ICD-10-CM

## 2022-05-21 DIAGNOSIS — R7303 Prediabetes: Secondary | ICD-10-CM | POA: Diagnosis present

## 2022-05-21 DIAGNOSIS — Z20822 Contact with and (suspected) exposure to covid-19: Secondary | ICD-10-CM | POA: Diagnosis present

## 2022-05-21 DIAGNOSIS — Z7901 Long term (current) use of anticoagulants: Secondary | ICD-10-CM

## 2022-05-21 DIAGNOSIS — L89102 Pressure ulcer of unspecified part of back, stage 2: Secondary | ICD-10-CM | POA: Diagnosis present

## 2022-05-21 DIAGNOSIS — Z981 Arthrodesis status: Secondary | ICD-10-CM

## 2022-05-21 DIAGNOSIS — Z79899 Other long term (current) drug therapy: Secondary | ICD-10-CM

## 2022-05-21 DIAGNOSIS — Z833 Family history of diabetes mellitus: Secondary | ICD-10-CM

## 2022-05-21 DIAGNOSIS — C3492 Malignant neoplasm of unspecified part of left bronchus or lung: Secondary | ICD-10-CM

## 2022-05-21 DIAGNOSIS — R0602 Shortness of breath: Secondary | ICD-10-CM | POA: Diagnosis present

## 2022-05-21 DIAGNOSIS — Z8249 Family history of ischemic heart disease and other diseases of the circulatory system: Secondary | ICD-10-CM

## 2022-05-21 DIAGNOSIS — E8809 Other disorders of plasma-protein metabolism, not elsewhere classified: Secondary | ICD-10-CM | POA: Diagnosis present

## 2022-05-21 DIAGNOSIS — Z880 Allergy status to penicillin: Secondary | ICD-10-CM | POA: Diagnosis not present

## 2022-05-21 DIAGNOSIS — I10 Essential (primary) hypertension: Secondary | ICD-10-CM | POA: Diagnosis present

## 2022-05-21 DIAGNOSIS — L8990 Pressure ulcer of unspecified site, unspecified stage: Secondary | ICD-10-CM | POA: Diagnosis present

## 2022-05-21 DIAGNOSIS — Z832 Family history of diseases of the blood and blood-forming organs and certain disorders involving the immune mechanism: Secondary | ICD-10-CM

## 2022-05-21 DIAGNOSIS — Z86711 Personal history of pulmonary embolism: Secondary | ICD-10-CM

## 2022-05-21 LAB — COMPREHENSIVE METABOLIC PANEL
ALT: 19 U/L (ref 0–44)
AST: 18 U/L (ref 15–41)
Albumin: 2.7 g/dL — ABNORMAL LOW (ref 3.5–5.0)
Alkaline Phosphatase: 166 U/L — ABNORMAL HIGH (ref 38–126)
Anion gap: 7 (ref 5–15)
BUN: 21 mg/dL — ABNORMAL HIGH (ref 6–20)
CO2: 33 mmol/L — ABNORMAL HIGH (ref 22–32)
Calcium: 9.4 mg/dL (ref 8.9–10.3)
Chloride: 100 mmol/L (ref 98–111)
Creatinine, Ser: 0.88 mg/dL (ref 0.61–1.24)
GFR, Estimated: >60 mL/min (ref >60)
Glucose, Bld: 120 mg/dL — ABNORMAL HIGH (ref 70–99)
Potassium: 4.1 mmol/L (ref 3.5–5.1)
Sodium: 140 mmol/L (ref 135–145)
Total Bilirubin: 0.5 mg/dL (ref 0.3–1.2)
Total Protein: 6.2 g/dL — ABNORMAL LOW (ref 6.5–8.1)

## 2022-05-21 LAB — RESP PANEL BY RT-PCR (FLU A&B, COVID) ARPGX2
Influenza A by PCR: NEGATIVE
Influenza B by PCR: NEGATIVE
SARS Coronavirus 2 by RT PCR: NEGATIVE

## 2022-05-21 LAB — BLOOD GAS, VENOUS
Acid-Base Excess: 10.1 mmol/L — ABNORMAL HIGH (ref 0.0–2.0)
Bicarbonate: 38.1 mmol/L — ABNORMAL HIGH (ref 20.0–28.0)
Drawn by: 6000
O2 Saturation: 80.7 %
Patient temperature: 36.7
pCO2, Ven: 68 mmHg — ABNORMAL HIGH (ref 44–60)
pH, Ven: 7.35 (ref 7.25–7.43)
pO2, Ven: 47 mmHg — ABNORMAL HIGH (ref 32–45)

## 2022-05-21 LAB — CBC WITH DIFFERENTIAL/PLATELET
Abs Immature Granulocytes: 0.18 10*3/uL — ABNORMAL HIGH (ref 0.00–0.07)
Basophils Absolute: 0.1 10*3/uL (ref 0.0–0.1)
Basophils Relative: 1 %
Eosinophils Absolute: 0 10*3/uL (ref 0.0–0.5)
Eosinophils Relative: 0 %
HCT: 37.8 % — ABNORMAL LOW (ref 39.0–52.0)
Hemoglobin: 11.3 g/dL — ABNORMAL LOW (ref 13.0–17.0)
Immature Granulocytes: 2 %
Lymphocytes Relative: 6 %
Lymphs Abs: 0.5 10*3/uL — ABNORMAL LOW (ref 0.7–4.0)
MCH: 31.3 pg (ref 26.0–34.0)
MCHC: 29.9 g/dL — ABNORMAL LOW (ref 30.0–36.0)
MCV: 104.7 fL — ABNORMAL HIGH (ref 80.0–100.0)
Monocytes Absolute: 0.6 10*3/uL (ref 0.1–1.0)
Monocytes Relative: 6 %
Neutro Abs: 7.9 10*3/uL — ABNORMAL HIGH (ref 1.7–7.7)
Neutrophils Relative %: 85 %
Platelets: 271 10*3/uL (ref 150–400)
RBC: 3.61 MIL/uL — ABNORMAL LOW (ref 4.22–5.81)
RDW: 16.6 % — ABNORMAL HIGH (ref 11.5–15.5)
WBC: 9.3 10*3/uL (ref 4.0–10.5)
nRBC: 0 % (ref 0.0–0.2)

## 2022-05-21 LAB — MRSA NEXT GEN BY PCR, NASAL: MRSA by PCR Next Gen: NOT DETECTED

## 2022-05-21 LAB — TROPONIN I (HIGH SENSITIVITY)
Troponin I (High Sensitivity): 18 ng/L — ABNORMAL HIGH (ref <18)
Troponin I (High Sensitivity): 14 ng/L (ref <18)

## 2022-05-21 MED ORDER — IPRATROPIUM-ALBUTEROL 0.5-2.5 (3) MG/3ML IN SOLN
3.0000 mL | Freq: Three times a day (TID) | RESPIRATORY_TRACT | Status: DC
  Administered 2022-05-21: 3 mL via RESPIRATORY_TRACT

## 2022-05-21 MED ORDER — ACETAMINOPHEN 325 MG PO TABS: 650.0000 mg | ORAL_TABLET | Freq: Four times a day (QID) | ORAL | Status: DC | PRN

## 2022-05-21 MED ORDER — ALBUTEROL SULFATE (2.5 MG/3ML) 0.083% IN NEBU
5.0000 mg | INHALATION_SOLUTION | Freq: Once | RESPIRATORY_TRACT | Status: AC
Start: 2022-05-21 — End: 2022-05-21

## 2022-05-21 MED ORDER — ATORVASTATIN CALCIUM 40 MG PO TABS
40.0000 mg | ORAL_TABLET | Freq: Every day | ORAL | Status: DC
  Administered 2022-05-22 – 2022-05-28 (×7): 40 mg via ORAL
  Filled 2022-05-21 (×7): qty 1

## 2022-05-21 MED ORDER — METHYLPREDNISOLONE SODIUM SUCC 125 MG IJ SOLR
60.0000 mg | Freq: Two times a day (BID) | INTRAMUSCULAR | Status: DC
  Administered 2022-05-22: 60 mg via INTRAVENOUS
  Filled 2022-05-21: qty 2

## 2022-05-21 MED ORDER — IPRATROPIUM-ALBUTEROL 0.5-2.5 (3) MG/3ML IN SOLN
3.0000 mL | Freq: Three times a day (TID) | RESPIRATORY_TRACT | Status: DC
Start: 2022-05-22 — End: 2022-05-22
  Administered 2022-05-22: 3 mL via RESPIRATORY_TRACT
  Filled 2022-05-21: qty 3

## 2022-05-21 MED ORDER — BUDESONIDE 0.5 MG/2ML IN SUSP
RESPIRATORY_TRACT | Status: AC
  Filled 2022-05-21: qty 2

## 2022-05-21 MED ORDER — ALBUTEROL SULFATE HFA 108 (90 BASE) MCG/ACT IN AERS: 2.0000 | INHALATION_SPRAY | RESPIRATORY_TRACT | Status: DC | PRN

## 2022-05-21 MED ORDER — MORPHINE SULFATE (PF) 2 MG/ML IV SOLN
2.0000 mg | INTRAVENOUS | Status: DC | PRN
  Administered 2022-05-22 – 2022-05-27 (×21): 2 mg via INTRAVENOUS
  Filled 2022-05-21 (×21): qty 1

## 2022-05-21 MED ORDER — MORPHINE SULFATE (PF) 2 MG/ML IV SOLN
2.0000 mg | Freq: Once | INTRAVENOUS | Status: AC
  Administered 2022-05-21: 2 mg via INTRAVENOUS
  Filled 2022-05-21: qty 1

## 2022-05-21 MED ORDER — PREDNISONE 20 MG PO TABS: 40.0000 mg | ORAL_TABLET | Freq: Every day | ORAL | Status: DC

## 2022-05-21 MED ORDER — GUAIFENESIN-DM 100-10 MG/5ML PO SYRP
10.0000 mL | ORAL_SOLUTION | Freq: Three times a day (TID) | ORAL | Status: AC
  Administered 2022-05-22 – 2022-05-23 (×4): 10 mL via ORAL
  Filled 2022-05-21 (×4): qty 10

## 2022-05-21 MED ORDER — VANCOMYCIN HCL 1750 MG/350ML IV SOLN
1750.0000 mg | INTRAVENOUS | Status: DC
Start: 2022-05-21 — End: 2022-05-22
  Administered 2022-05-21: 1750 mg via INTRAVENOUS
  Filled 2022-05-21: qty 350

## 2022-05-21 MED ORDER — PANTOPRAZOLE SODIUM 40 MG PO TBEC
40.0000 mg | DELAYED_RELEASE_TABLET | Freq: Two times a day (BID) | ORAL | Status: DC
  Administered 2022-05-22 – 2022-05-28 (×13): 40 mg via ORAL
  Filled 2022-05-21 (×13): qty 1

## 2022-05-21 MED ORDER — METHYLPREDNISOLONE SODIUM SUCC 125 MG IJ SOLR
125.0000 mg | Freq: Once | INTRAMUSCULAR | Status: AC
  Administered 2022-05-21: 125 mg via INTRAVENOUS
  Filled 2022-05-21: qty 2

## 2022-05-21 MED ORDER — POLYETHYLENE GLYCOL 3350 17 G PO PACK
17.0000 g | PACK | Freq: Every day | ORAL | Status: DC | PRN
  Administered 2022-05-26: 17 g via ORAL
  Filled 2022-05-21: qty 1

## 2022-05-21 MED ORDER — ACETAMINOPHEN 650 MG RE SUPP: 650.0000 mg | Freq: Four times a day (QID) | RECTAL | Status: DC | PRN

## 2022-05-21 MED ORDER — LORAZEPAM 2 MG/ML IJ SOLN
0.5000 mg | Freq: Once | INTRAMUSCULAR | Status: AC
Start: 2022-05-21 — End: 2022-05-21
  Administered 2022-05-21: 0.5 mg via INTRAVENOUS
  Filled 2022-05-21: qty 1

## 2022-05-21 MED ORDER — IPRATROPIUM-ALBUTEROL 0.5-2.5 (3) MG/3ML IN SOLN
RESPIRATORY_TRACT | Status: AC
  Filled 2022-05-21: qty 3

## 2022-05-21 MED ORDER — BUDESONIDE 0.5 MG/2ML IN SUSP
0.5000 mg | Freq: Two times a day (BID) | RESPIRATORY_TRACT | Status: DC
  Administered 2022-05-21 – 2022-05-28 (×9): 0.5 mg via RESPIRATORY_TRACT
  Filled 2022-05-21 (×10): qty 2

## 2022-05-21 MED ORDER — GABAPENTIN 100 MG PO CAPS
100.0000 mg | ORAL_CAPSULE | Freq: Three times a day (TID) | ORAL | Status: DC
  Administered 2022-05-22 – 2022-05-28 (×20): 100 mg via ORAL
  Filled 2022-05-21 (×20): qty 1

## 2022-05-21 MED ORDER — ALBUTEROL SULFATE (2.5 MG/3ML) 0.083% IN NEBU
INHALATION_SOLUTION | RESPIRATORY_TRACT | Status: AC
  Administered 2022-05-21: 5 mg via RESPIRATORY_TRACT
  Filled 2022-05-21: qty 6

## 2022-05-21 MED ORDER — IPRATROPIUM-ALBUTEROL 0.5-2.5 (3) MG/3ML IN SOLN: 3.0000 mL | RESPIRATORY_TRACT | Status: DC | PRN

## 2022-05-21 NOTE — Assessment & Plan Note (Addendum)
Currently stable on 5-6 L continue DuoNebs and Brovana Continue Pulmicort Continue IV Solu-Medrol Added Yupelri --aim for oxygen saturation 88-90% Morphine prn sob --he does not need to be on 5-6L as he claims he needs--aim for saturations 88-90% Add hypertonic saline x 2 days --pt continues to intermittently refuse nebs and other therapy Overall improved. D/c home with prednisone taper

## 2022-05-21 NOTE — ED Provider Notes (Signed)
Tony Sullivan   CSN: [id number] Arrival date & time: 05/21/22  1424     History  Chief Complaint  Patient presents with   Shortness of [place] is a 61 y.o. male. With past medical history of hypertension, COPD, GERD, substance abuse who presents to the emergency department with shortness of breath.  States he has been feeling poorly over the past day and a half.  States that today he was particularly feeling unwell with shortness of breath increased from baseline.  He states that he took his oxygen at home and noted it was 86.  He states that later he took it and it was in the 70s so he called EMS.  He states that he wears 5 L of oxygen all the time and intermittently uses BiPAP at night.  He endorses chest tightness but denies chest pain or palpitations.  He denies any increased sputum production from his baseline.  Denies any fevers, nausea or diarrhea.  Denies sick contacts.   Shortness of Breath Associated symptoms: chest pain and cough   Associated symptoms: no fever        Home Medications Prior to Admission medications   Medication Sig Start Date End Date Taking? Authorizing Provider  acetaminophen (TYLENOL) 325 MG tablet Take 2 tablets (650 mg total) by mouth every 6 (six) hours as needed for mild pain (or Fever >/= 101). 10/30/21   [name], MD  albuterol (PROVENTIL) (2.5 MG/3ML) 0.083% nebulizer solution Take 3 mLs (2.5 mg total) by nebulization every 4 (four) hours as needed for wheezing or shortness of breath. 05/08/22 05/08/23  [name], MD  albuterol (VENTOLIN HFA) 108 (90 Base) MCG/ACT inhaler Inhale 2 puffs into the lungs every 6 (six) hours as needed for wheezing or shortness of breath. 05/08/22   [name], MD  apixaban (ELIQUIS) 5 MG TABS tablet Take 1 tablet (5 mg total) by mouth 2 (two) times daily. 05/08/22 08/06/22  [name], MD  atorvastatin (LIPITOR) 40 MG tablet TAKE ONE TABLET BY  MOUTH DAILY Patient taking differently: Take 40 mg by mouth daily. 07/06/20   [name], MD  bacitracin ointment Apply 1 Application topically 2 (two) times daily. 05/15/22   Evalee Jefferson, PA-C  benztropine (COGENTIN) 0.5 MG tablet Take 0.5 mg by mouth at bedtime.    [provider]  Budeson-Glycopyrrol-Formoterol (BREZTRI AEROSPHERE) 160-9-4.8 MCG/ACT AERO Inhale 2 puffs into the lungs in the morning and at bedtime. 05/08/22   [name], MD  dextromethorphan-guaiFENesin (MUCINEX DM) 30-600 MG 12hr tablet Take 1 tablet by mouth 2 (two) times daily. 05/02/22   Barton Dubois, MD  diclofenac Sodium (VOLTAREN) 1 % GEL Apply 2 g topically 4 (four) times daily as needed (pain).    [provider]  dorzolamide (TRUSOPT) 2 % ophthalmic solution Place 1 drop into the left eye 2 (two) times daily. 12/12/20   [provider]  doxycycline (VIBRAMYCIN) 100 MG capsule Take 1 capsule (100 mg total) by mouth 2 (two) times daily. 05/15/22   Evalee Jefferson, PA-C  furosemide (LASIX) 20 MG tablet Take 1 tablet (20 mg total) by mouth daily. 05/08/22   [name], MD  gabapentin (NEURONTIN) 100 MG capsule Take 1 capsule (100 mg total) by mouth 3 (three) times daily. 05/08/22 07/07/22  [name], MD  hydrOXYzine (ATARAX) 25 MG tablet Take 1 tablet (25 mg total) by mouth 3 (three) times daily as needed for anxiety. 05/08/22  [name], MD  latanoprost (XALATAN) 0.005 % ophthalmic solution Place 1 drop into both eyes at bedtime. 05/24/19   [provider]  loxapine (LOXITANE) 10 MG capsule Take 1 capsule (10 mg total) by mouth at bedtime. 05/08/22   [name], MD  mirtazapine (REMERON) 30 MG tablet Take 1 tablet (30 mg total) by mouth at bedtime. 05/08/22   [name], MD  Multiple Vitamin (MULTIVITAMIN WITH MINERALS) TABS tablet Take 1 tablet by mouth daily. 05/08/22   [name], MD  OXYGEN Inhale 4-5 L into the lungs as directed. 4-5 liters with sleep  and exertion as needed for low oxygen    [provider]  pantoprazole (PROTONIX) 40 MG tablet Take 1 tablet (40 mg total) by mouth 2 (two) times daily. 05/08/22   [name], MD  polyethylene glycol (MIRALAX / GLYCOLAX) 17 g packet Take 17 g by mouth daily. 05/08/22   [name], MD  potassium chloride SA (KLOR-CON M) 20 MEQ tablet Take 1 tablet (20 mEq total) by mouth daily. 05/08/22   [name], MD  senna-docusate (SENOKOT-S) 8.6-50 MG tablet Take 2 tablets by mouth at bedtime. Patient taking differently: Take 2 tablets by mouth at bedtime as needed for mild constipation. 10/30/21   [name], MD  simethicone (MYLICON) 80 MG chewable tablet Chew 1 tablet (80 mg total) by mouth 4 (four) times daily as needed for flatulence. Patient taking differently: Chew 80 mg by mouth every 6 (six) hours as needed for flatulence. 03/15/22   [name], Pratik D, DO      Allergies    Penicillins and Levocetirizine    Review of Systems   Review of Systems  Constitutional:  Negative for fever.  Respiratory:  Positive for cough and shortness of breath.   Cardiovascular:  Positive for chest pain. Negative for palpitations.  Gastrointestinal:  Negative for nausea.  All other systems reviewed and are negative.   Physical Exam Updated Vital Signs BP 118/79   Pulse (!) 106   Temp 98.1 F (36.7 C) (Oral)   Ht 5\' 11"  (1.803 m)   Wt 61.3 kg   SpO2 99%   BMI 18.85 kg/m  Physical Exam Vitals and nursing Sullivan reviewed.  Constitutional:      Appearance: Normal appearance. He is underweight. He is ill-appearing.  HENT:     Head: Normocephalic.     Mouth/Throat:     Mouth: Mucous membranes are dry.     Pharynx: Oropharynx is clear.  Eyes:     General: No scleral icterus.    Extraocular Movements: Extraocular movements intact.     Pupils: Pupils are equal, round, and reactive to light.  Neck:     Vascular: No JVD.  Cardiovascular:     Rate and Rhythm: Regular rhythm.  Tachycardia present.     Pulses:          Radial pulses are 1+ on the right side and 1+ on the left side.     Heart sounds: Heart sounds are distant. No murmur heard. Pulmonary:     Effort: Tachypnea and prolonged expiration present. No respiratory distress.     Breath sounds: Decreased breath sounds present.  Chest:     Chest wall: No tenderness.  Abdominal:     General: Bowel sounds are normal.     Palpations: Abdomen is soft.     Tenderness: There is no abdominal tenderness.  Musculoskeletal:        General: Normal range of motion.  Cervical back: Neck supple.     Right lower leg: No edema.     Left lower leg: No edema.  Skin:    General: Skin is warm and dry.     Capillary Refill: Capillary refill takes less than 2 seconds.  Neurological:     General: No focal deficit present.     Mental Status: He is alert and oriented to person, place, and time. Mental status is at baseline.  Psychiatric:        Mood and Affect: Mood normal.        Behavior: Behavior normal.        Thought Content: Thought content normal.        Judgment: Judgment normal.    ED Results / Procedures / Treatments   Labs (all labs ordered are listed, but only abnormal results are displayed) Labs Reviewed  COMPREHENSIVE METABOLIC PANEL - Abnormal; Notable for the following components:      Result Value   CO2 33 (*)    Glucose, Bld 120 (*)    BUN 21 (*)    Total Protein 6.2 (*)    Albumin 2.7 (*)    Alkaline Phosphatase 166 (*)    All other components within normal limits  CBC WITH DIFFERENTIAL/PLATELET - Abnormal; Notable for the following components:   RBC 3.61 (*)    Hemoglobin 11.3 (*)    HCT 37.8 (*)    MCV 104.7 (*)    MCHC 29.9 (*)    RDW 16.6 (*)    Neutro Abs 7.9 (*)    Lymphs Abs 0.5 (*)    Abs Immature Granulocytes 0.18 (*)    All other components within normal limits  BLOOD GAS, VENOUS - Abnormal; Notable for the following components:   pCO2, Ven 68 (*)    pO2, Ven 47 (*)     Bicarbonate 38.1 (*)    Acid-Base Excess 10.1 (*)    All other components within normal limits  TROPONIN I (HIGH SENSITIVITY) - Abnormal; Notable for the following components:   Troponin I (High Sensitivity) 18 (*)    All other components within normal limits  RESP PANEL BY RT-PCR (FLU A&B, COVID) ARPGX2  TROPONIN I (HIGH SENSITIVITY)   EKG EKG Interpretation  Date/Time:  Tuesday May 21 2022 15:06:00 EDT Ventricular Rate:  127 PR Interval:    QRS Duration: 90 QT Interval:  357 QTC Calculation: 459 R Axis:   0 Text Interpretation: Atrial fibrillation Paired ventricular premature complexes Probable anterolateral infarct, recent Baseline wander in lead(s) II III aVF Interpretation limited secondary to artifact Confirmed by Fredia Sorrow [phone]) on 05/21/2022 3:51:34 PM  Radiology DG Chest Port 1 View  Result Date: 05/21/2022 CLINICAL DATA:  Shortness of breath.  Smoker. EXAM: PORTABLE CHEST 1 VIEW COMPARISON:  05/15/2022 FINDINGS: Normal sized heart. Clear lungs. The lungs remain mildly hyperexpanded. Previously described callus associated with the right posterior 6th rib, compatible with an old, healed fracture. IMPRESSION: No acute abnormality.  Stable mild changes of COPD. Electronically Signed   By: Claudie Revering M.D.   On: 05/21/2022 15:44    Procedures Procedures   Medications Ordered in ED Medications  albuterol (PROVENTIL) (2.5 MG/3ML) 0.083% nebulizer solution 5 mg (5 mg Nebulization Given 05/21/22 1453)  methylPREDNISolone sodium succinate (SOLU-MEDROL) 125 mg/2 mL injection 125 mg (125 mg Intravenous Given 05/21/22 1543)    ED Course/ Medical Decision Making/ A&P  Medical Decision Making Amount and/or Complexity of Data Reviewed Labs: ordered. Radiology: ordered.  Risk Prescription drug management.  This patient presents to the ED for concern of shortness of breath, this involves an extensive number of treatment options, and is a  complaint that carries with it a high risk of complications and morbidity.  The differential diagnosis includes ACS, PE, tamponade, pneumothorax, pleural effusion, pneumonia, COPD exacerbation, asthma, dissection, etc.  Co morbidities that complicate the patient evaluation COPD  Additional history obtained:  Additional history obtained from: None External records from outside source obtained and reviewed including: Most recent ED physician Sullivan  EKG: EKG: atrial fibrillation, rate 120.   Cardiac Monitoring: The patient was maintained on a cardiac monitor.  I personally viewed and interpreted the cardiac monitored which showed an underlying rhythm of: Sinus rhythm in the 80s  Lab Results: I personally ordered, reviewed, and interpreted labs. Pertinent results include: CBC without leukocytosis.  Hemoglobin 11.3, similar to previous CMP with mildly elevated BUN, normal creatinine.  Likely dehydrated. Troponin 18, similar to previous Venous gas, mildly retaining with a PCO2 of 68 COVID and flu negative  Imaging Studies ordered:  I ordered imaging studies which included x-ray.  I independently reviewed & interpreted imaging & am in agreement with radiology impression. Imaging shows: Chest x-ray without pneumonia, pleural effusion or pneumothorax  Medications  I ordered medication including Atrovent, Solu-Medrol for COPD Reevaluation of the patient after medication shows that patient improved -I reviewed the patient's home medications and did not make adjustments. -I did not prescribe new home medications.  Tests Considered: Considered CT chest for PE study, however improved on treatment and home O2.  Not continually hypoxic or dyspneic.  Admission  Critical Interventions: Oxygen therapy  Consultations: I requested consultation with the hospitalist, Dr. Denton Brick,  and discussed lab and imaging findings as well as pertinent plan - they recommend: Admission  [facility] Lives alone, found  in feces  ED Course:  61 year old male who presents to emergency department with shortness of breath. On my initial exam he is dyspneic, but oxygenating well on 5 L nasal cannula.  He has distant lung sounds with no obvious wheezing or rhonchi. Given that albuterol/ipratropium treatment as well as Solu-Medrol.  Obtained a chest x-ray which does not show any pneumonia, pleural effusion, pneumothorax. Obtain COVID and flu which is negative Obtained a CBC which shows no leukocytosis.  He states he has been afebrile.  No pneumonia on the x-ray.  Doubt this is the source of his shortness of breath. CMP essentially within normal limits for the patient. Troponin 18, delta is pending.  He has no chest pain, EKG without ischemia or infarction.  Doubt this is ACS. I did obtain a venous gas given his COPD.  He is mildly retaining with a PCO2 of 68.  He is noncompliant on BiPAP at home.  Will likely need some optimization. Considered PE, his presentation of symptoms is inconsistent with this.  Will defer for now.  If he continues to clinically decompensate, may be beneficial for hospital team to do this.  Symptoms are also not consistent with dissection, myocarditis, pericarditis, asthma.  Likely COPD exacerbation.  He is currently on his home 5 L and oxygenating well.  He is no longer dyspneic on my reassessment. Notably, the patient is malodorous, disheveled.  He smells like urine and feces.  Per nursing, he was found in his feces at home.  I do believe that the patient lives at home alone.  He  also has wounds on his back.  There are notes that he is wheelchair-bound and spends most of his time sitting or laying flat on his back.  In addition to his COPD exacerbation, I feel that it is unsafe to discharge him home to these conditions.  He will need admission for optimization as well as possible placement to a facility.  After consideration of the diagnostic results and the patients response to treatment, I  feel that the patent would benefit from admission. The patient has been appropriately medically screened and/or stabilized in the ED.  Final Clinical Impression(s) / ED Diagnoses Final diagnoses:  COPD exacerbation [facility])    Rx / DC Orders ED Discharge Orders     None         Mickie Hillier, PA-C 05/21/22 1756    [name], MD 05/23/22 1732

## 2022-05-21 NOTE — Assessment & Plan Note (Addendum)
Seen on CT 03/13/2022  -patient had bronchoscopy 5/26, showed endobronchial mass blocking takeoff of left upper lobe bronchus. 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   Patient has missed several appointments with oncology as outpatient for follow-up on his newly diagnosed lung cancer.   Dr. Delton Coombes was consulted during his recent hospitalization, recommended CAP CT With contrast and MRI brain (neither was suspicious for metastatic disease, though MRI was motion degraded ) for further staging of his large cell cancer, he was to follow-up after scans to discuss plan. --discussed with Dr. Delton Coombes 7/27>>no chemo, needs XRT which Dr. Raliegh Ip will help set up --discussed with Dr. Kenna Gilbert for 10 XRT treatments after d/c --pt and daughter have been informed of the XRT plan and to expect a call from Dr. Clabe Seal offic

## 2022-05-21 NOTE — Assessment & Plan Note (Addendum)
O2 sats are maintained above 90% on 5 L.  Currently on 6 L.  Has NIV at home. - On BiPAP nightly.

## 2022-05-21 NOTE — Progress Notes (Signed)
Pharmacy Antibiotic Note  Tony Sullivan is a 61 y.o. male admitted on 05/21/2022 with COPD exacerbation and wounds. Pharmacy has been consulted for vancomycin dosing. Cr <1  Plan: Vancomycin 1750mg  q24h - est AUC 491 Watch Cr, cultures, LOT  Height: 5\' 11"  (180.3 cm) Weight: 58.4 kg (128 lb 12 oz) IBW/kg (Calculated) : 75.3  Temp (24hrs), Avg:98.3 F (36.8 C), Min:98.1 F (36.7 C), Max:98.5 F (36.9 C)  Recent Labs  Lab 05/15/22 1356 05/21/22 1452  WBC 9.6 9.3  CREATININE 0.61 0.88    Estimated Creatinine Clearance: 75.6 mL/min (by C-G formula based on SCr of 0.88 mg/dL).    Allergies  Allergen Reactions   Penicillins Anaphylaxis    Has patient had a PCN reaction causing immediate rash, facial/tongue/throat swelling, SOB or lightheadedness with hypotension: Yes Has patient had a PCN reaction causing severe rash involving mucus membranes or skin necrosis: No Has patient had a PCN reaction that required hospitalization No Has patient had a PCN reaction occurring within the last 10 years: No If all of the above answers are "NO", then may proceed with Cephalosporin use.    Levocetirizine Other (See Comments)    Muscle cramps   Arrie Senate, PharmD, BCPS, [facility] Clinical Pharmacist Please check AMION for all [place] numbers 05/21/2022

## 2022-05-21 NOTE — Assessment & Plan Note (Signed)
Restart apixaban

## 2022-05-21 NOTE — ED Notes (Signed)
Large wound to back, several on left leg and one on right foot.

## 2022-05-21 NOTE — Progress Notes (Signed)
EKG done and given to nurse 

## 2022-05-21 NOTE — Assessment & Plan Note (Deleted)
Resume Lasix 20 mg daily

## 2022-05-21 NOTE — ED Notes (Signed)
Pt verbalized he does not ambulate, he uses the wheelchair at home to get around. He said he can transfer himself from bed to wheelchair. Pt verbalized he has not ambulated in weeks.

## 2022-05-21 NOTE — ED Notes (Signed)
All 6 wounds cleaned and dressing applied. Attending took pictures of all wounds. Ginger ale and mango ice cream given to pt per request.

## 2022-05-21 NOTE — ED Triage Notes (Signed)
Pt arrived [facility] for SOB from home. Pt was on bipap at home because he was 79%. Nebt tx 2.5 albuterol and Atrovent.

## 2022-05-21 NOTE — H&P (Addendum)
History and Physical    Tony Sullivan IRW:[id number] DOB: [birth year] DOA: 05/21/2022  PCP: [name], FNP   Patient coming from: Home  I have personally briefly reviewed patient's old medical records in [place]  Chief Complaint: Difficulty breathing  HPI: Tony Sullivan is a 61 y.o. male with medical history significant for left lung non-small cell lung cancer, pulmonary embolism, COPD with chronic respiratory failure on 5 L and BiPAP nightly, alcohol abuse and bipolar polar disorder. Patient presented to the ED with complaints of difficulty breathing over the past 2 days.  Patient answered some questions, but tells me he is not ready to answer many questions at this time. He has a chronic cough is unable to tell me if this is worse.  Patient lives alone.  Ambulates with a wheelchair at home.  Per ED provider, he was found in his feces and smelled of urine on presentation to the ED. Patient is on home O2, 5 L, he checked his O2 sats at home and it was 79%, he is unable to tell me if this was with his oxygen or without.  He reports multiple wounds to his lower extremities and back.  With significant pain in his back.  He tells me he has had a wound on his back for about a week.    Recent hospitalization for COPD exacerbation 7/10 to 7/12 and 7/3/ - 7/6 both with acute on chronic hypoxic respiratory failure.  Initially required BiPAP, treated with steroids antibiotics and bronchodilators. Per documentation, typically noncompliance with NIV despite home visit by respiratory therapy care.  Also unsuccessful attempts at defining goals of care in the past.  His care has been difficult to manage due to not being cooperative and not participating in his care.  ED Course: Temperature 98.4.  Heart rate 83-106.  Respiratory rate 12-22.  Blood pressure systolic 761-950.  O2 sats 94 to 100% on 5 L. WBC 9.3.  VBG shows PO2 of 47, PCO2 of 68, pH of 7.35. Chest x-ray without acute  abnormality. Solu-Medrol 125 mg given, albuterol nebs given. Hospitalist to admit for COPD exacerbation.  Review of Systems: As per HPI all other systems reviewed and negative.  Past Medical History:  Diagnosis Date   Anxiety    Arthritis    COPD (chronic obstructive pulmonary disease) ([place]) 2004   diagnosed in 2004, no PFT's to date.  Started on home O2 12/2013, after found to be desatting at PCP's office, and referred to pulmonology.   Depression    GERD (gastroesophageal reflux disease)    High blood pressure    Prediabetes    Seasonal allergies    Substance abuse ([place])    Tobacco dependence     Past Surgical History:  Procedure Laterality Date   ANTERIOR CERVICAL DECOMP/DISCECTOMY FUSION N/A 09/06/2019   Procedure: Anterior Cervical Discecectomy Fusion - Cerivcal Five-Cervical Six;  Surgeon: Kary Kos, MD;  Location: [facility];  Service: Neurosurgery;  Laterality: N/A;  Anterior Cervical Discecectomy Fusion - Cerivcal Five-Cervical Six   ANTERIOR CERVICAL DISCECTOMY  09/06/2019   APPENDECTOMY  1980   COLONOSCOPY WITH PROPOFOL N/A 12/10/2016   Procedure: COLONOSCOPY WITH PROPOFOL;  Surgeon: [name], MD;  Location: [facility];  Service: Endoscopy;  Laterality: N/A;   FLEXIBLE BRONCHOSCOPY Left 03/22/2022   Procedure: FLEXIBLE BRONCHOSCOPY;  Surgeon: Rigoberto Noel, MD;  Location: [facility];  Service: Pulmonary;  Laterality: Left;   HIP SURGERY Right    SHOULDER ARTHROSCOPY Right  reports that he has been smoking cigarettes. He has a 9.00 pack-year smoking history. He has never used smokeless tobacco. He reports that he does not currently use alcohol after a past usage of about 14.0 - 21.0 standard drinks of alcohol per week. He reports current drug use. Drugs: Marijuana and Cocaine.  Allergies  Allergen Reactions   Penicillins Anaphylaxis    Has patient had a PCN reaction causing immediate rash, facial/tongue/throat swelling, SOB or lightheadedness with hypotension:  Yes Has patient had a PCN reaction causing severe rash involving mucus membranes or skin necrosis: No Has patient had a PCN reaction that required hospitalization No Has patient had a PCN reaction occurring within the last 10 years: No If all of the above answers are "NO", then may proceed with Cephalosporin use.    Levocetirizine Other (See Comments)    Muscle cramps    Family History  Problem Relation Age of Onset   Emphysema Mother    Allergies Mother        "everyone in family"   Asthma Mother        "everyone in family"   Heart disease Mother    Clotting disorder Mother    Cancer Mother    Emphysema Father    Allergies Father    Allergies Son    Seizures Brother    Diabetes Brother     Prior to Admission medications   Medication Sig Start Date End Date Taking? Authorizing Provider  acetaminophen (TYLENOL) 325 MG tablet Take 2 tablets (650 mg total) by mouth every 6 (six) hours as needed for mild pain (or Fever >/= 101). 10/30/21   [name], MD  albuterol (PROVENTIL) (2.5 MG/3ML) 0.083% nebulizer solution Take 3 mLs (2.5 mg total) by nebulization every 4 (four) hours as needed for wheezing or shortness of breath. 05/08/22 05/08/23  [name], MD  albuterol (VENTOLIN HFA) 108 (90 Base) MCG/ACT inhaler Inhale 2 puffs into the lungs every 6 (six) hours as needed for wheezing or shortness of breath. 05/08/22   [name], MD  apixaban (ELIQUIS) 5 MG TABS tablet Take 1 tablet (5 mg total) by mouth 2 (two) times daily. 05/08/22 08/06/22  [name], MD  atorvastatin (LIPITOR) 40 MG tablet TAKE ONE TABLET BY MOUTH DAILY Patient taking differently: Take 40 mg by mouth daily. 07/06/20   [name], MD  bacitracin ointment Apply 1 Application topically 2 (two) times daily. 05/15/22   Evalee Jefferson, PA-C  benztropine (COGENTIN) 0.5 MG tablet Take 0.5 mg by mouth at bedtime.    [provider]  Budeson-Glycopyrrol-Formoterol (BREZTRI AEROSPHERE) 160-9-4.8  MCG/ACT AERO Inhale 2 puffs into the lungs in the morning and at bedtime. 05/08/22   [name], MD  dextromethorphan-guaiFENesin (MUCINEX DM) 30-600 MG 12hr tablet Take 1 tablet by mouth 2 (two) times daily. 05/02/22   Barton Dubois, MD  diclofenac Sodium (VOLTAREN) 1 % GEL Apply 2 g topically 4 (four) times daily as needed (pain).    [provider]  dorzolamide (TRUSOPT) 2 % ophthalmic solution Place 1 drop into the left eye 2 (two) times daily. 12/12/20   [provider]  doxycycline (VIBRAMYCIN) 100 MG capsule Take 1 capsule (100 mg total) by mouth 2 (two) times daily. 05/15/22   Evalee Jefferson, PA-C  furosemide (LASIX) 20 MG tablet Take 1 tablet (20 mg total) by mouth daily. 05/08/22   [name], MD  gabapentin (NEURONTIN) 100 MG capsule Take 1 capsule (100 mg total) by mouth 3 (three) times  daily. 05/08/22 07/07/22  [name], MD  hydrOXYzine (ATARAX) 25 MG tablet Take 1 tablet (25 mg total) by mouth 3 (three) times daily as needed for anxiety. 05/08/22   Avital Dancy, Courage, MD  latanoprost (XALATAN) 0.005 % ophthalmic solution Place 1 drop into both eyes at bedtime. 05/24/19   [provider]  loxapine (LOXITANE) 10 MG capsule Take 1 capsule (10 mg total) by mouth at bedtime. 05/08/22   [name], MD  mirtazapine (REMERON) 30 MG tablet Take 1 tablet (30 mg total) by mouth at bedtime. 05/08/22   [name], MD  Multiple Vitamin (MULTIVITAMIN WITH MINERALS) TABS tablet Take 1 tablet by mouth daily. 05/08/22   [name], MD  OXYGEN Inhale 4-5 L into the lungs as directed. 4-5 liters with sleep and exertion as needed for low oxygen    [provider]  pantoprazole (PROTONIX) 40 MG tablet Take 1 tablet (40 mg total) by mouth 2 (two) times daily. 05/08/22   [name], MD  polyethylene glycol (MIRALAX / GLYCOLAX) 17 g packet Take 17 g by mouth daily. 05/08/22   [name], MD  potassium chloride SA (KLOR-CON M) 20 MEQ tablet Take 1  tablet (20 mEq total) by mouth daily. 05/08/22   [name], MD  senna-docusate (SENOKOT-S) 8.6-50 MG tablet Take 2 tablets by mouth at bedtime. Patient taking differently: Take 2 tablets by mouth at bedtime as needed for mild constipation. 10/30/21   [name], MD  simethicone (MYLICON) 80 MG chewable tablet Chew 1 tablet (80 mg total) by mouth 4 (four) times daily as needed for flatulence. Patient taking differently: Chew 80 mg by mouth every 6 (six) hours as needed for flatulence. 03/15/22   [name], DO    Physical Exam: Vitals:   05/21/22 1730 05/21/22 1800 05/21/22 1840 05/21/22 1936  BP: 110/82 108/76  (!) 167/99  Pulse: 87 83  (!) 105  Resp: _0 Temp:   98.1 F (36.7 C) 98.4 F (36.9 C)  TempSrc:   Oral Oral  SpO2: 95% 96%  94%  Weight:      Height:        Constitutional: Chronically ill-appearing, having Dressing changes, appears to be uncomfortable from pain Vitals:   05/21/22 1730 05/21/22 1800 05/21/22 1840 05/21/22 1936  BP: 110/82 108/76  (!) 167/99  Pulse: 87 83  (!) 105  Resp: _1 Temp:   98.1 F (36.7 C) 98.4 F (36.9 C)  TempSrc:   Oral Oral  SpO2: 95% 96%  94%  Weight:      Height:       Eyes: PERRL, lids and conjunctivae normal ENMT: Mucous membranes are dry Neck: normal, supple, no masses, no thyromegaly Respiratory: Decreased air entry bilaterally with faint expiratory wheeze, mild to moderate increased work of breathing, but patient is having his wounds clean and appears to be uncomfortable and in pain Cardiovascular: Regular rate and rhythm, no murmurs / rubs / gallops.  Mild dependent swelling to left lower extremity patient reports this is chronic and unchanged.  Lower extremities warm. Abdomen: no tenderness, no masses palpated. No hepatosplenomegaly. Bowel sounds positive.  Musculoskeletal: no clubbing / cyanosis. No joint deformity upper and lower extremities.  Skin: 3 open wounds to left lower extremity and 1 wound  to right lower extremity all stage II ulcers.   Left ulcer on posterior surface appears infected, with purulent drainage present but no erythema or surrounding signs of cellulitis. Right foot  ulcer on dorsal surface of foot-small area of purulence,  No surrounding signs of cellulitis. Also 1 small sacral decubitus ulcer noninfected, Stage II. 1 large ulcer in thoracolumbar region measuring about 12 to 14 cm across, at least stage III probably stage IV pending imaging findings, with significant purulence, wound appears infected. Neurologic: Moving all extremities spontaneously Psychiatric: Normal judgment and insight. Alert and oriented x 3. Normal mood.    Pictures from Left lower extremity Below-         Pictures from Right tower extremity below -     Sacral Area-   Thoraco-Lumbar region-         Labs on Admission: I have personally reviewed following labs and imaging studies  CBC: Recent Labs  Lab 05/15/22 1356 05/21/22 1452  WBC 9.6 9.3  NEUTROABS 7.6 7.9*  HGB 10.7* 11.3*  HCT 37.7* 37.8*  MCV 107.4* 104.7*  PLT 222 287   Basic Metabolic Panel: Recent Labs  Lab 05/15/22 1356 05/21/22 1452  NA 144 140  K 4.3 4.1  CL 106 100  CO2 31 33*  GLUCOSE 92 120*  BUN 16 21*  CREATININE 0.61 0.88  CALCIUM 8.7* 9.4   Liver Function Tests: Recent Labs  Lab 05/15/22 1356 05/21/22 1452  AST 19 18  ALT 20 19  ALKPHOS 164* 166*  BILITOT 0.5 0.5  PROT 5.4* 6.2*  ALBUMIN 2.3* 2.7*    Radiological Exams on Admission: DG Chest Port 1 View  Result Date: 05/21/2022 CLINICAL DATA:  Shortness of breath.  Smoker. EXAM: PORTABLE CHEST 1 VIEW COMPARISON:  05/15/2022 FINDINGS: Normal sized heart. Clear lungs. The lungs remain mildly hyperexpanded. Previously described callus associated with the right posterior 6th rib, compatible with an old, healed fracture. IMPRESSION: No acute abnormality.  Stable mild changes of COPD. Electronically Signed   By: Claudie Revering  M.D.   On: 05/21/2022 15:44    EKG: Artifacts. Re- ordered.  Assessment/Plan Principal Problem:   Acute exacerbation of chronic obstructive pulmonary disease (COPD) (HCC) Active Problems:   Chronic respiratory failure with hypoxia (HCC)   Hypoalbuminemia due to protein-calorie malnutrition (HCC)   Non-small cell lung cancer (HCC)   History of pulmonary embolus (PE)   Pressure injury of skin   COPD with acute exacerbation (HCC)   Assessment and Plan: * Acute exacerbation of chronic obstructive pulmonary disease (COPD) ([place]) With dyspnea, increased work of breathing, wheezing and reduced air entry bilaterally.  This is patient's third hospitalization for same this month.  Reports O2 sat 79% on room air at home, unsure if this is with or without his home O2.  No documented hypoxia here, sats have maintained above 90% on home 5 L. Chest Xray.  Historically noncompliant with therapy. -DuoNebs as needed and scheduled -Mucolytic's as needed -IV Solu-Medrol 125 mg given, continue 60 twice daily - antibiotics  Pressure injury of skin Multiple ulcers, some of which do not appear to be pressure injury related and some appear infected, but without surrounding signs of cellulitis. Wound stages ranging from Stage 2 to Stage 3 or 4 pending imaging findings. Right foot appears to be a large ruptured bullae.  He is afebrile without leukocytosis, and rules out for sepsis at this time. - Stat MRI thoracic and lumbar spine without contrast -General surgery consult in the morning if back area requires debridement - Will start antibiotics IV vancomycin, penicillin allergy noted, ( per pharmacy- no  Documentation of tolerating cephalosporins in the past. Will add levaquin after rechecking QTC  on EKG. -Wound care consult for lower extremity ulcers -Will hold Eliquis pending general surgery evaluation -Morphine 2 mg every 4 hours as needed  Chronic respiratory failure with hypoxia (HCC) O2 sats are  maintained above 90% on 5 L.  Currently on 6 L.  Has NIV at home. - On BiPAP nightly.  History of pulmonary embolus (PE) Hold Eliquis for now pending general surgery evaluation in a.m.  Non-small cell lung cancer ([place]) Seen on CT 03/13/2022 patient had bronchoscopy 5/26, showed endobronchial mass blocking takeoff of left upper lobe bronchus. 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   Patient has missed several appointments with oncology as outpatient for follow-up on his newly diagnosed lung cancer.   Dr. Delton Coombes was consulted, during his recent hospitalization, recommended CAP CT With contrast and MRI brain (neither was suspicious for metastatic disease, though MRI was motion degraded ) for further staging of his large cell cancer, he was to follow-up after scans to discuss plan.   DVT prophylaxis:  SCDS Code Status: Full Family Communication: None at bedside Disposition Plan: ~ 2 days Consults called: Gen surg Admission status: Inpt, tele  I certify that at the point of admission it is my clinical judgment that the patient will require inpatient hospital care spanning beyond 2 midnights from the point of admission due to high intensity of service, high risk for further deterioration and high frequency of surveillance required.   Author: Bethena Roys, MD 05/21/2022 9:24 PM  For on call review [email or web address].

## 2022-05-21 NOTE — Assessment & Plan Note (Addendum)
Multiple ulcers, some of which do not appear to be pressure injury related and some appear infected, but without surrounding signs of cellulitis. 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  He is afebrile without leukocytosis, and rules out for sepsis at this time. -MRI thoracic and lumbar spine--neg for osteo, discitis, abscess -appreciate wound care RN consult -Wound care consult for lower extremity ulcers -general surgery eval>>no   need for surgical debridement at this time.  Continue Santyl as ordered -pt continues to intermittently refuse dressing changes during the hospitalization

## 2022-05-21 NOTE — ED Notes (Signed)
Patient transported to MRI 

## 2022-05-22 ENCOUNTER — Encounter: Payer: Medicaid Other | Admitting: Nurse Practitioner

## 2022-05-22 DIAGNOSIS — C3490 Malignant neoplasm of unspecified part of unspecified bronchus or lung: Secondary | ICD-10-CM | POA: Diagnosis not present

## 2022-05-22 DIAGNOSIS — Z720 Tobacco use: Secondary | ICD-10-CM | POA: Diagnosis not present

## 2022-05-22 DIAGNOSIS — L89102 Pressure ulcer of unspecified part of back, stage 2: Secondary | ICD-10-CM | POA: Diagnosis not present

## 2022-05-22 DIAGNOSIS — J9621 Acute and chronic respiratory failure with hypoxia: Secondary | ICD-10-CM | POA: Diagnosis not present

## 2022-05-22 DIAGNOSIS — J9622 Acute and chronic respiratory failure with hypercapnia: Secondary | ICD-10-CM

## 2022-05-22 DIAGNOSIS — J441 Chronic obstructive pulmonary disease with (acute) exacerbation: Secondary | ICD-10-CM | POA: Diagnosis not present

## 2022-05-22 LAB — CBC
HCT: 35.2 % — ABNORMAL LOW (ref 39.0–52.0)
Hemoglobin: 10.4 g/dL — ABNORMAL LOW (ref 13.0–17.0)
MCH: 31 pg (ref 26.0–34.0)
MCHC: 29.5 g/dL — ABNORMAL LOW (ref 30.0–36.0)
MCV: 105.1 fL — ABNORMAL HIGH (ref 80.0–100.0)
Platelets: 239 10*3/uL (ref 150–400)
RBC: 3.35 MIL/uL — ABNORMAL LOW (ref 4.22–5.81)
RDW: 16.4 % — ABNORMAL HIGH (ref 11.5–15.5)
WBC: 11.5 10*3/uL — ABNORMAL HIGH (ref 4.0–10.5)
nRBC: 0 % (ref 0.0–0.2)

## 2022-05-22 LAB — BASIC METABOLIC PANEL
Anion gap: 8 (ref 5–15)
BUN: 17 mg/dL (ref 6–20)
CO2: 34 mmol/L — ABNORMAL HIGH (ref 22–32)
Calcium: 9 mg/dL (ref 8.9–10.3)
Chloride: 102 mmol/L (ref 98–111)
Creatinine, Ser: 0.78 mg/dL (ref 0.61–1.24)
GFR, Estimated: >60 mL/min (ref >60)
Glucose, Bld: 115 mg/dL — ABNORMAL HIGH (ref 70–99)
Potassium: 4.1 mmol/L (ref 3.5–5.1)
Sodium: 144 mmol/L (ref 135–145)

## 2022-05-22 LAB — BLOOD GAS, ARTERIAL
Acid-Base Excess: 10.4 mmol/L — ABNORMAL HIGH (ref 0.0–2.0)
Bicarbonate: 37.4 mmol/L — ABNORMAL HIGH (ref 20.0–28.0)
Drawn by: 27016
FIO2: 40 %
O2 Saturation: 97.1 %
Patient temperature: 36.3
pCO2 arterial: 57 mmHg — ABNORMAL HIGH (ref 32–48)
pH, Arterial: 7.42 (ref 7.35–7.45)
pO2, Arterial: 70 mmHg — ABNORMAL LOW (ref 83–108)

## 2022-05-22 MED ORDER — COLLAGENASE 250 UNIT/GM EX OINT
TOPICAL_OINTMENT | Freq: Every day | CUTANEOUS | Status: DC
  Filled 2022-05-22 (×2): qty 30

## 2022-05-22 MED ORDER — SILVER SULFADIAZINE 1 % EX CREA
TOPICAL_CREAM | Freq: Every day | CUTANEOUS | Status: DC
  Administered 2022-05-28: 1 via TOPICAL
  Filled 2022-05-22: qty 85

## 2022-05-22 MED ORDER — REVEFENACIN 175 MCG/3ML IN SOLN
175.0000 ug | Freq: Every day | RESPIRATORY_TRACT | Status: DC
  Administered 2022-05-23 – 2022-05-24 (×2): 175 ug via RESPIRATORY_TRACT
  Filled 2022-05-22 (×3): qty 3

## 2022-05-22 MED ORDER — METHYLPREDNISOLONE SODIUM SUCC 125 MG IJ SOLR
60.0000 mg | Freq: Two times a day (BID) | INTRAMUSCULAR | Status: DC
  Administered 2022-05-22 – 2022-05-28 (×12): 60 mg via INTRAVENOUS
  Filled 2022-05-22 (×12): qty 2

## 2022-05-22 MED ORDER — IPRATROPIUM-ALBUTEROL 0.5-2.5 (3) MG/3ML IN SOLN
3.0000 mL | Freq: Four times a day (QID) | RESPIRATORY_TRACT | Status: DC
Start: 2022-05-22 — End: 2022-05-25
  Administered 2022-05-22 – 2022-05-24 (×8): 3 mL via RESPIRATORY_TRACT
  Filled 2022-05-22 (×8): qty 3

## 2022-05-22 MED ORDER — VANCOMYCIN HCL 750 MG/150ML IV SOLN
750.0000 mg | Freq: Two times a day (BID) | INTRAVENOUS | Status: DC
  Administered 2022-05-22 – 2022-05-24 (×4): 750 mg via INTRAVENOUS
  Filled 2022-05-22 (×4): qty 150

## 2022-05-22 MED ORDER — ARFORMOTEROL TARTRATE 15 MCG/2ML IN NEBU
15.0000 ug | INHALATION_SOLUTION | Freq: Two times a day (BID) | RESPIRATORY_TRACT | Status: DC
  Administered 2022-05-22 – 2022-05-28 (×12): 15 ug via RESPIRATORY_TRACT
  Filled 2022-05-22 (×14): qty 2

## 2022-05-22 MED ORDER — CHLORHEXIDINE GLUCONATE CLOTH 2 % EX PADS
6.0000 | MEDICATED_PAD | Freq: Every day | CUTANEOUS | Status: DC
  Administered 2022-05-22 – 2022-05-24 (×3): 6 via TOPICAL

## 2022-05-22 MED ORDER — LEVOFLOXACIN IN D5W 500 MG/100ML IV SOLN
500.0000 mg | Freq: Once | INTRAVENOUS | Status: AC
  Administered 2022-05-22: 500 mg via INTRAVENOUS
  Filled 2022-05-22: qty 100

## 2022-05-22 MED ORDER — LEVOFLOXACIN IN D5W 500 MG/100ML IV SOLN
500.0000 mg | INTRAVENOUS | Status: DC
  Administered 2022-05-23 (×2): 500 mg via INTRAVENOUS
  Filled 2022-05-22 (×2): qty 100

## 2022-05-22 NOTE — Assessment & Plan Note (Addendum)
Chronically on 4-5 L nasal cannula Initially required 6 L nasal cannula with oxygen saturation 90% Wean oxygen as tolerated Secondary to COPD exacerbation --aim for oxygen saturation 88-92% --morphine prn sob --he does not need to be on 5-6L that he claims he needs>> Added hypertonic saline --as from prior, he continues to intermittenly refuse nebs and other therapy

## 2022-05-22 NOTE — Progress Notes (Signed)
Pharmacy Antibiotic Note  Tony Sullivan is a 61 y.o. male admitted on 05/21/2022 with  wound infection .  Pharmacy has been consulted to add levofloxacin to ABX regimen.  Plan: Levofloxacin 500mg  IV Q24H.  Height: 5\' 11"  (180.3 cm) Weight: 58.4 kg (128 lb 12 oz) IBW/kg (Calculated) : 75.3  Temp (24hrs), Avg:98.3 F (36.8 C), Min:98.1 F (36.7 C), Max:98.5 F (36.9 C)  Recent Labs  Lab 05/15/22 1356 05/21/22 1452  WBC 9.6 9.3  CREATININE 0.61 0.88    Estimated Creatinine Clearance: 75.6 mL/min (by C-G formula based on SCr of 0.88 mg/dL).    Allergies  Allergen Reactions   Penicillins Anaphylaxis    Has patient had a PCN reaction causing immediate rash, facial/tongue/throat swelling, SOB or lightheadedness with hypotension: Yes Has patient had a PCN reaction causing severe rash involving mucus membranes or skin necrosis: No Has patient had a PCN reaction that required hospitalization No Has patient had a PCN reaction occurring within the last 10 years: No If all of the above answers are "NO", then may proceed with Cephalosporin use.    Levocetirizine Other (See Comments)    Muscle cramps    Thank you for allowing pharmacy to be a part of this patient's care.  Wynona Neat, PharmD, BCPS  05/22/2022 12:46 AM

## 2022-05-22 NOTE — Plan of Care (Signed)
  Problem: Acute Rehab PT Goals(only PT should resolve) Goal: Pt Will Go Supine/Side To Sit Flowsheets (Taken 05/22/2022 1530) Pt will go Supine/Side to Sit: with minimal assist Goal: Pt Will Go Sit To Supine/Side Flowsheets (Taken 05/22/2022 1530) Pt will go Sit to Supine/Side: with minimal assist Goal: Patient Will Transfer Sit To/From Stand Flowsheets (Taken 05/22/2022 1530) Patient will transfer sit to/from stand: with minimal assist Goal: Pt Will Transfer Bed To Chair/Chair To Bed Flowsheets (Taken 05/22/2022 1530) Pt will Transfer Bed to Chair/Chair to Bed: with min assist

## 2022-05-22 NOTE — Progress Notes (Signed)
PROGRESS NOTE  SHIHEEM CORPORAN FAO:[id number] DOB: [birth year] DOA: 05/21/2022 PCP: [name], FNP  Brief History:   61 year old male with a history of COPD, chronic respiratory failure on 4.5 L at home, anxiety/depression, hypertension, pulmonary embolus (03/13/2022 CTA chest) on apixaban, NSCLC diagnosed 03/22/2022 via bronchoscopy (Dr. Elsworth Soho) presenting with worsening shortness of breath over the past 2 to 3 days.  The patient has had numerous hospital admissions with similar presentations.  It appears that the patient has poor social circumstances and poor ability to take care of himself.  The patient was admitted to the hospital from 04/30/2022 to 05/02/2022 secondary to COPD exacerbation.  Once again, he was admitted from 05/06/2022 to 05/08/2022 with a similar presentation.  He was discharged home with prednisone taper. Upon presentation, the patient was tachypneic with oxygen saturation 90% on 6 L.  He was started on IV Solu-Medrol, DuoNebs.  General surgery was consulted for his wounds.     Assessment and Plan: * Acute exacerbation of chronic obstructive pulmonary disease (COPD) ([place]) Currently stable on 4 L continue DuoNebs and Brovana Continue Pulmicort Continue IV Solu-Medrol Add Yupelri  Pressure injury of skin Multiple ulcers, some of which do not appear to be pressure injury related and some appear infected, but without surrounding signs of cellulitis. 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  He is afebrile without leukocytosis, and rules out for sepsis at this time. -MRI thoracic and lumbar spine--neg for osteo, discitis, abscess -appreciate wound care RN consult -Wound care consult for lower extremity ulcers -Will hold Eliquis pending general surgery evaluation -Morphine 2 mg every 4 hours as needed  History of pulmonary embolus (PE) Restart apixaban  Non-small cell lung cancer ([place]) Seen on CT 03/13/2022   -patient had bronchoscopy 5/26, showed endobronchial mass blocking takeoff of left upper lobe bronchus. 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   Patient has missed several appointments with oncology as outpatient for follow-up on his newly diagnosed lung cancer.   Dr. Delton Coombes was consulted during his recent hospitalization, recommended CAP CT With contrast and MRI brain (neither was suspicious for metastatic disease, though MRI was motion degraded ) for further staging of his large cell cancer, he was to follow-up after scans to discuss plan.  Acute on chronic respiratory failure with hypoxia and hypercapnia (HCC) Chronically on 5 L nasal cannula Initially required 6 L nasal cannula with oxygen saturation 90% Wean oxygen as tolerated Secondary to COPD exacerbation  Bipolar disorder ([place]) Resume home meds  Tobacco abuse - Reportedly recently quit tobacco however with his history of poor compliance were not sure how much he is smoking at this time        Family Communication:   no Family at bedside  Consultants:  none  Code Status:  FULL  DVT Prophylaxis:  apixaban on hold   Procedures: As Listed in Progress Note Above  Antibiotics: Vanc 7/25>. Levoflox 7/25>>  RN Pressure Injury Documentation: Pressure Injury 05/06/22 Vertebral column Lower Unstageable - Full thickness tissue loss in which the base of the injury is covered by slough (yellow, tan, gray, green or brown) and/or eschar (tan, brown or black) in the wound bed. some smaller Unstageab (Active)  05/06/22 1130  Location: Vertebral column  Location Orientation: Lower  Staging: Unstageable - Full thickness tissue loss in which the base of the injury is covered  by slough (yellow, tan, gray, green or brown) and/or eschar (tan, brown or black) in the wound bed.  Wound Description (Comments): some smaller Unstageable wounds surround the larger one also  Present on Admission: Yes   Dressing Type Foam - Lift dressing to assess site every shift 05/22/22 0743     Pressure Injury 05/06/22 Sacrum Medial Unstageable - Full thickness tissue loss in which the base of the injury is covered by slough (yellow, tan, gray, green or brown) and/or eschar (tan, brown or black) in the wound bed. (Active)  05/06/22 1130  Location: Sacrum  Location Orientation: Medial  Staging: Unstageable - Full thickness tissue loss in which the base of the injury is covered by slough (yellow, tan, gray, green or brown) and/or eschar (tan, brown or black) in the wound bed.  Wound Description (Comments):   Present on Admission: Yes  Dressing Type Foam - Lift dressing to assess site every shift 05/22/22 0743        Subjective: Patient continues to be short of breath.  He denies any nausea, vomiting, diarrhea, abdominal pain.  He has a nonproductive cough.  Objective: Vitals:   05/22/22 0731 05/22/22 0743 05/22/22 0747 05/22/22 0826  BP:    (!) 152/99  Pulse:    100  Resp:    (!) 24  Temp: 98.3 F (36.8 C)     TempSrc: Oral     SpO2:  98% 98% 100%  Weight:      Height:        Intake/Output Summary (Last 24 hours) at 05/22/2022 0914 Last data filed at 05/22/2022 0400 Gross per 24 hour  Intake --  Output 300 ml  Net -300 ml   Weight change:  Exam:  General:  Pt is alert, follows commands appropriately, not in acute distress HEENT: No icterus, No thrush, No neck mass, [place]/AT Cardiovascular: RRR, S1/S2, no rubs, no gallops Respiratory: Diminished breath sounds bilateral.  Bilateral wheezing. Abdomen: Soft/+BS, non tender, non distended, no guarding Extremities: No edema, No lymphangitis, No petechiae, No rashes, no synovitis   Data Reviewed: I have personally reviewed following labs and imaging studies Basic Metabolic Panel: Recent Labs  Lab 05/15/22 1356 05/21/22 1452 05/22/22 0634  NA 144 140 144  K 4.3 4.1 4.1  CL 106 100 102  CO2 31 33* 34*  GLUCOSE 92 120* 115*  BUN  16 21* 17  CREATININE 0.61 0.88 0.78  CALCIUM 8.7* 9.4 9.0   Liver Function Tests: Recent Labs  Lab 05/15/22 1356 05/21/22 1452  AST 19 18  ALT 20 19  ALKPHOS 164* 166*  BILITOT 0.5 0.5  PROT 5.4* 6.2*  ALBUMIN 2.3* 2.7*   No results for input(s): "LIPASE", "AMYLASE" in the last 168 hours. No results for input(s): "AMMONIA" in the last 168 hours. Coagulation Profile: No results for input(s): "INR", "PROTIME" in the last 168 hours. CBC: Recent Labs  Lab 05/15/22 1356 05/21/22 1452 05/22/22 0634  WBC 9.6 9.3 11.5*  NEUTROABS 7.6 7.9*  --   HGB 10.7* 11.3* 10.4*  HCT 37.7* 37.8* 35.2*  MCV 107.4* 104.7* 105.1*  PLT 222 271 239   Cardiac Enzymes: No results for input(s): "CKTOTAL", "CKMB", "CKMBINDEX", "TROPONINI" in the last 168 hours. BNP: Invalid input(s): "POCBNP" CBG: No results for input(s): "GLUCAP" in the last 168 hours. HbA1C: No results for input(s): "HGBA1C" in the last 72 hours. Urine analysis:    Component Value Date/Time   COLORURINE YELLOW 04/29/2022 0803   APPEARANCEUR HAZY (A) 04/29/2022 1610  LABSPEC 1.017 04/29/2022 0803   PHURINE 5.0 04/29/2022 0803   GLUCOSEU NEGATIVE 04/29/2022 0803   HGBUR NEGATIVE 04/29/2022 0803   BILIRUBINUR NEGATIVE 04/29/2022 0803   KETONESUR 20 (A) 04/29/2022 0803   PROTEINUR NEGATIVE 04/29/2022 0803   UROBILINOGEN 1.0 06/12/2015 1021   NITRITE NEGATIVE 04/29/2022 0803   LEUKOCYTESUR NEGATIVE 04/29/2022 0803   Sepsis Labs: @LABRCNTIP (procalcitonin:4,lacticidven:4) ) Recent Results (from the past 240 hour(s))  Resp Panel by RT-PCR (Flu A&B, Covid) Anterior Nasal Swab     Status: None   Collection Time: 05/21/22  4:00 PM   Specimen: Anterior Nasal Swab  Result Value Ref Range Status   SARS Coronavirus 2 by RT PCR NEGATIVE NEGATIVE Final    Comment: (NOTE) SARS-CoV-2 target nucleic acids are NOT DETECTED.  The SARS-CoV-2 RNA is generally detectable in upper respiratory specimens during the acute phase of  infection. The lowest concentration of SARS-CoV-2 viral copies this assay can detect is 138 copies/mL. A negative result does not preclude SARS-Cov-2 infection and should not be used as the sole basis for treatment or other patient management decisions. A negative result may occur with  improper specimen collection/handling, submission of specimen other than nasopharyngeal swab, presence of viral mutation(s) within the areas targeted by this assay, and inadequate number of viral copies(<138 copies/mL). A negative result must be combined with clinical observations, patient history, and epidemiological information. The expected result is Negative.  Fact Sheet for Patients:  EntrepreneurPulse.com.au  Fact Sheet for Healthcare Providers:  IncredibleEmployment.be  This test is no t yet approved or cleared by the [place] FDA and  has been authorized for detection and/or diagnosis of SARS-CoV-2 by FDA under an Emergency Use Authorization (EUA). This EUA will remain  in effect (meaning this test can be used) for the duration of the COVID-19 declaration under Section 564(b)(1) of the Act, 21 U.S.C.section 360bbb-3(b)(1), unless the authorization is terminated  or revoked sooner.       Influenza A by PCR NEGATIVE NEGATIVE Final   Influenza B by PCR NEGATIVE NEGATIVE Final    Comment: (NOTE) The Xpert Xpress SARS-CoV-2/FLU/RSV plus assay is intended as an aid in the diagnosis of influenza from Nasopharyngeal swab specimens and should not be used as a sole basis for treatment. Nasal washings and aspirates are unacceptable for Xpert Xpress SARS-CoV-2/FLU/RSV testing.  Fact Sheet for Patients: EntrepreneurPulse.com.au  Fact Sheet for Healthcare Providers: IncredibleEmployment.be  This test is not yet approved or cleared by the [place] FDA and has been authorized for detection and/or diagnosis of SARS-CoV-2  by FDA under an Emergency Use Authorization (EUA). This EUA will remain in effect (meaning this test can be used) for the duration of the COVID-19 declaration under Section 564(b)(1) of the Act, 21 U.S.C. section 360bbb-3(b)(1), unless the authorization is terminated or revoked.  Performed at [facility], [address] [place], [place] [address]   MRSA Next Gen by PCR, Nasal     Status: None   Collection Time: 05/21/22  8:06 PM   Specimen: Nasal Mucosa; Nasal Swab  Result Value Ref Range Status   MRSA by PCR Next Gen NOT DETECTED NOT DETECTED Final    Comment: (NOTE) The GeneXpert MRSA Assay (FDA approved for NASAL specimens only), is one component of a comprehensive MRSA colonization surveillance program. It is not intended to diagnose MRSA infection nor to guide or monitor treatment for MRSA infections. Test performance is not FDA approved in patients less than 73 years old. Performed at [facility], [phone]  [address], [place], [place] [address]      Scheduled Meds:  arformoterol  15 mcg Nebulization BID   atorvastatin  40 mg Oral Daily   budesonide (PULMICORT) nebulizer solution  0.5 mg Nebulization BID   Chlorhexidine Gluconate Cloth  6 each Topical Daily   collagenase   Topical Daily   gabapentin  100 mg Oral TID   guaiFENesin-dextromethorphan  10 mL Oral Q8H   ipratropium-albuterol  3 mL Nebulization Q6H   methylPREDNISolone (SOLU-MEDROL) injection  60 mg Intravenous Q12H   pantoprazole  40 mg Oral BID   [START ON 05/23/2022] revefenacin  175 mcg Nebulization Daily   silver sulfADIAZINE   Topical Daily   Continuous Infusions:  [START ON 05/23/2022] levofloxacin (LEVAQUIN) IV     vancomycin      Procedures/Studies: MR THORACIC SPINE WO CONTRAST  Result Date: 05/21/2022 CLINICAL DATA:  Initial evaluation for soft tissue ulceration to back. EXAM: MRI THORACIC AND LUMBAR SPINE WITHOUT CONTRAST TECHNIQUE: Multiplanar and multiecho pulse sequences of the thoracic and lumbar  spine were obtained without intravenous contrast. COMPARISON:  Prior MRI from 08/26/2019. FINDINGS: MRI THORACIC SPINE FINDINGS Alignment: Physiologic with preservation of the normal thoracic kyphosis. No significant listhesis. Vertebrae: Susceptibility artifact related to prior ACDF noted at C5-6 on counter sequence. Underlying bone marrow signal intensity within normal limits. No worrisome osseous lesions. Multiple acute to subacute compression fractures are seen as follows; 1. T5 compression fracture with up to 50% height loss and trace 2 mm bony retropulsion. 2. T6 compression fracture with up to 35% height loss and trace 2 mm bony retropulsion. 3. T7 compression fracture with 35% height loss without bony retropulsion. 4. T8 compression fracture with up to 50% height loss and 3 mm bony retropulsion. 5. T9 compression fracture with 20% height loss without significant bony retropulsion. 6. T11 compression fracture with mild 35% central height loss without significant bony retropulsion. 7. T12 compression fracture with mild 15% height loss without significant bony retropulsion. Marrow edema involving the left transverse process of T6 likely reflects an acute nondisplaced fracture (a series 18, image 15). Additional chronic T10 compression fracture with mild 30% central height loss without retropulsion noted. No evidence for osteomyelitis discitis within the thoracic spine. Cord:  Normal signal and morphology. Paraspinal and other soft tissues: Mild diffuse edema noted within the subcutaneous fat of the posterior thoracolumbar spine. No loculated collections. Consolidative opacity at the visualized posterior left lung base, which could reflect atelectasis or infiltrate. Disc levels: No significant disc pathology seen within the thoracic spine for age. Trace bony retropulsion about several of the compression fractures as above. No significant spinal stenosis. Foramina remain largely patent. MRI LUMBAR SPINE FINDINGS  Segmentation: Transitional features seen about the lumbosacral junction with a partially lumbarized S1 segment. Alignment: Physiologic with preservation of the normal lumbar lordosis. No significant listhesis. Vertebrae: Bone marrow signal intensity within normal limits. No discrete or worrisome osseous lesions. Several acute to early subacute compression fractures are seen as follows; 1. Compression fracture involving the superior endplate of L2 with 13% central height loss with trace 2 mm bony retropulsion. 2. Compression fracture involving the L3 vertebral body with mild 10% height loss without significant bony retropulsion. 3. Acute to subacute on chronic compression fracture involving the L4 vertebral body with complete central height loss with trace 2 mm bony retropulsion. 4. Compression fracture involving the superior endplate of L5 with mild 20% height loss without bony retropulsion. 5. Compression fracture involving the superior endplate of S1 without  significant height loss or bony retropulsion. Conus medullaris and cauda equina: Conus extends to the L1 level. Conus and cauda equina appear normal. Paraspinal and other soft tissues: Soft tissue defect overlying the spinous processes of L1 through L3, compatible with ulceration (series 2, image 6). Diffuse edema within the underlying subcutaneous fat and paraspinous musculature. No discrete or loculated collections. No convincing evidence for osteomyelitis discitis or septic arthritis. Visualized visceral structures within normal limits. Disc levels: L1-2: Negative interspace. Mild facet hypertrophy. No significant stenosis. L2-3: Mild disc bulge with trace bony retropulsion. Mild facet hypertrophy. No significant spinal stenosis. Foramina remain patent. L3-4: Disc bulge with disc desiccation. Superimposed small left foraminal disc protrusion closely approximates the exiting left L3 nerve root (series 5, image 16). Mild facet hypertrophy. No significant  spinal stenosis. Foramina remain patent. L4-5: Mild disc bulge with disc desiccation and trace bony retropulsion. Mild facet hypertrophy. No significant spinal stenosis. Moderate right greater than left L4 foraminal narrowing. L5-S1: Mild diffuse disc bulge. Moderate facet hypertrophy with associated small joint effusions. No significant spinal stenosis. Moderate right with mild-to-moderate left L5 foraminal narrowing. S1-2: Rudimentary S1-2 interspace. No disc bulge or focal disc herniation. No stenosis. IMPRESSION: 1. Multiple acute to subacute compression fractures involving the T5, T6, T7, T8, T9, T11, T12, L2, L3, L4, L5, and S1 as detailed above. No more than trace bony retropulsion at several levels without significant spinal stenosis. 2. Soft tissue ulceration overlying the lower back at the level of the spinous processes of L1 through L3. Diffuse edema throughout the underlying posterior paraspinous soft tissues without loculated or drainable fluid collection. No convincing evidence for osteomyelitis discitis or septic arthritis within the thoracolumbar spine itself. 3. Mild multilevel lumbar spondylosis without significant spinal stenosis. Moderate bilateral L4 and L5 foraminal narrowing. 4. Patchy consolidative opacity within the posterior left lung base, either atelectasis or infiltrate. Electronically Signed   By: Jeannine Boga M.D.   On: 05/21/2022 20:06   MR LUMBAR SPINE WO CONTRAST  Result Date: 05/21/2022 CLINICAL DATA:  Initial evaluation for soft tissue ulceration to back. EXAM: MRI THORACIC AND LUMBAR SPINE WITHOUT CONTRAST TECHNIQUE: Multiplanar and multiecho pulse sequences of the thoracic and lumbar spine were obtained without intravenous contrast. COMPARISON:  Prior MRI from 08/26/2019. FINDINGS: MRI THORACIC SPINE FINDINGS Alignment: Physiologic with preservation of the normal thoracic kyphosis. No significant listhesis. Vertebrae: Susceptibility artifact related to prior ACDF  noted at C5-6 on counter sequence. Underlying bone marrow signal intensity within normal limits. No worrisome osseous lesions. Multiple acute to subacute compression fractures are seen as follows; 1. T5 compression fracture with up to 50% height loss and trace 2 mm bony retropulsion. 2. T6 compression fracture with up to 35% height loss and trace 2 mm bony retropulsion. 3. T7 compression fracture with 35% height loss without bony retropulsion. 4. T8 compression fracture with up to 50% height loss and 3 mm bony retropulsion. 5. T9 compression fracture with 20% height loss without significant bony retropulsion. 6. T11 compression fracture with mild 35% central height loss without significant bony retropulsion. 7. T12 compression fracture with mild 15% height loss without significant bony retropulsion. Marrow edema involving the left transverse process of T6 likely reflects an acute nondisplaced fracture (a series 18, image 15). Additional chronic T10 compression fracture with mild 30% central height loss without retropulsion noted. No evidence for osteomyelitis discitis within the thoracic spine. Cord:  Normal signal and morphology. Paraspinal and other soft tissues: Mild diffuse edema noted within the subcutaneous fat  of the posterior thoracolumbar spine. No loculated collections. Consolidative opacity at the visualized posterior left lung base, which could reflect atelectasis or infiltrate. Disc levels: No significant disc pathology seen within the thoracic spine for age. Trace bony retropulsion about several of the compression fractures as above. No significant spinal stenosis. Foramina remain largely patent. MRI LUMBAR SPINE FINDINGS Segmentation: Transitional features seen about the lumbosacral junction with a partially lumbarized S1 segment. Alignment: Physiologic with preservation of the normal lumbar lordosis. No significant listhesis. Vertebrae: Bone marrow signal intensity within normal limits. No discrete  or worrisome osseous lesions. Several acute to early subacute compression fractures are seen as follows; 1. Compression fracture involving the superior endplate of L2 with 88% central height loss with trace 2 mm bony retropulsion. 2. Compression fracture involving the L3 vertebral body with mild 10% height loss without significant bony retropulsion. 3. Acute to subacute on chronic compression fracture involving the L4 vertebral body with complete central height loss with trace 2 mm bony retropulsion. 4. Compression fracture involving the superior endplate of L5 with mild 20% height loss without bony retropulsion. 5. Compression fracture involving the superior endplate of S1 without significant height loss or bony retropulsion. Conus medullaris and cauda equina: Conus extends to the L1 level. Conus and cauda equina appear normal. Paraspinal and other soft tissues: Soft tissue defect overlying the spinous processes of L1 through L3, compatible with ulceration (series 2, image 6). Diffuse edema within the underlying subcutaneous fat and paraspinous musculature. No discrete or loculated collections. No convincing evidence for osteomyelitis discitis or septic arthritis. Visualized visceral structures within normal limits. Disc levels: L1-2: Negative interspace. Mild facet hypertrophy. No significant stenosis. L2-3: Mild disc bulge with trace bony retropulsion. Mild facet hypertrophy. No significant spinal stenosis. Foramina remain patent. L3-4: Disc bulge with disc desiccation. Superimposed small left foraminal disc protrusion closely approximates the exiting left L3 nerve root (series 5, image 16). Mild facet hypertrophy. No significant spinal stenosis. Foramina remain patent. L4-5: Mild disc bulge with disc desiccation and trace bony retropulsion. Mild facet hypertrophy. No significant spinal stenosis. Moderate right greater than left L4 foraminal narrowing. L5-S1: Mild diffuse disc bulge. Moderate facet hypertrophy  with associated small joint effusions. No significant spinal stenosis. Moderate right with mild-to-moderate left L5 foraminal narrowing. S1-2: Rudimentary S1-2 interspace. No disc bulge or focal disc herniation. No stenosis. IMPRESSION: 1. Multiple acute to subacute compression fractures involving the T5, T6, T7, T8, T9, T11, T12, L2, L3, L4, L5, and S1 as detailed above. No more than trace bony retropulsion at several levels without significant spinal stenosis. 2. Soft tissue ulceration overlying the lower back at the level of the spinous processes of L1 through L3. Diffuse edema throughout the underlying posterior paraspinous soft tissues without loculated or drainable fluid collection. No convincing evidence for osteomyelitis discitis or septic arthritis within the thoracolumbar spine itself. 3. Mild multilevel lumbar spondylosis without significant spinal stenosis. Moderate bilateral L4 and L5 foraminal narrowing. 4. Patchy consolidative opacity within the posterior left lung base, either atelectasis or infiltrate. Electronically Signed   By: Jeannine Boga M.D.   On: 05/21/2022 20:06   DG Chest Port 1 View  Result Date: 05/21/2022 CLINICAL DATA:  Shortness of breath.  Smoker. EXAM: PORTABLE CHEST 1 VIEW COMPARISON:  05/15/2022 FINDINGS: Normal sized heart. Clear lungs. The lungs remain mildly hyperexpanded. Previously described callus associated with the right posterior 6th rib, compatible with an old, healed fracture. IMPRESSION: No acute abnormality.  Stable mild changes of COPD. Electronically Signed  By: Claudie Revering M.D.   On: 05/21/2022 15:44   CT Angio Chest PE W and/or Wo Contrast  Result Date: 05/15/2022 CLINICAL DATA:  Hemoptysis since yesterday morning, non-small cell lung cancer EXAM: CT ANGIOGRAPHY CHEST WITH CONTRAST TECHNIQUE: Multidetector CT imaging of the chest was performed using the standard protocol during bolus administration of intravenous contrast. Multiplanar CT image  reconstructions and MIPs were obtained to evaluate the vascular anatomy. RADIATION DOSE REDUCTION: This exam was performed according to the departmental dose-optimization program which includes automated exposure control, adjustment of the mA and/or kV according to patient size and/or use of iterative reconstruction technique. CONTRAST:  61mL OMNIPAQUE IOHEXOL 350 MG/ML SOLN COMPARISON:  05/07/2022 FINDINGS: Cardiovascular: This is a technically adequate evaluation of the pulmonary vasculature. There are no filling defects or pulmonary emboli. The heart is unremarkable without pericardial effusion. No evidence of thoracic aortic aneurysm or dissection. Stable atherosclerosis of the aorta and coronary vasculature. Mediastinum/Nodes: No enlarged mediastinal, hilar, or axillary lymph nodes. Thyroid gland, trachea, and esophagus demonstrate no significant findings. Lungs/Pleura: No significant change in the large endobronchial lesion occupying the majority of the left upper lobe bronchial tree. Severe upper lobe predominant emphysema is again noted. No acute airspace disease. Trace left pleural effusion. No pneumothorax. Upper Abdomen: Stable nodular right adrenal thickening measuring 2 x 1 cm, mean attenuation -3 HU, most consistent with adenoma. No acute upper abdominal findings. Musculoskeletal: No acute or destructive bony lesions. Multiple prior healed bilateral rib fractures. Chronic compression deformities at T5, T7, T8, T11, and T12. Reconstructed images demonstrate no additional findings. Review of the MIP images confirms the above findings. IMPRESSION: 1. No evidence of pulmonary embolus. 2. Stable left upper lobe endobronchial mass consistent with known lung cancer. 3. Trace left pleural effusion. 4. Aortic Atherosclerosis (ICD10-I70.0) and Emphysema (ICD10-J43.9). 5. Stable right adrenal nodule, previously characterized as adenoma. Electronically Signed   By: Randa Ngo M.D.   On: 05/15/2022 18:04   CT  Head Wo Contrast  Result Date: 05/15/2022 CLINICAL DATA:  Mental status change. EXAM: CT HEAD WITHOUT CONTRAST TECHNIQUE: Contiguous axial images were obtained from the base of the skull through the vertex without intravenous contrast. RADIATION DOSE REDUCTION: This exam was performed according to the departmental dose-optimization program which includes automated exposure control, adjustment of the mA and/or kV according to patient size and/or use of iterative reconstruction technique. COMPARISON:  None Available. FINDINGS: Brain: No evidence of acute infarction, hemorrhage, hydrocephalus, extra-axial collection or mass lesion/mass effect. Vascular: No hyperdense vessel or unexpected calcification. Skull: Normal. Negative for fracture or focal lesion. Sinuses/Orbits: Minimal polypoid thickening in the maxillary sinuses Other: None. IMPRESSION: No acute intracranial abnormality. Electronically Signed   By: Fidela Salisbury M.D.   On: 05/15/2022 18:02   DG Chest Portable 1 View  Result Date: 05/15/2022 CLINICAL DATA:  Hemoptysis.  Pain. EXAM: PORTABLE CHEST 1 VIEW COMPARISON:  05/07/2022 CT and 05/06/2022 chest x-ray FINDINGS: Heart size is normal. There is perihilar peribronchial thickening. No new consolidations or pleural effusions. No evidence for pulmonary edema. Remote RIGHT posterior 6th rib fracture. IMPRESSION: No evidence for acute abnormality. Chronic bronchitic changes. Electronically Signed   By: Nolon Nations M.D.   On: 05/15/2022 13:48   CT CHEST ABDOMEN PELVIS W CONTRAST  Result Date: 05/08/2022 CLINICAL DATA:  Non-small cell lung cancer, acute respiratory failure with hypoxia, failure to thrive, nausea/vomiting EXAM: CT CHEST, ABDOMEN, AND PELVIS WITH CONTRAST TECHNIQUE: Multidetector CT imaging of the chest, abdomen and pelvis was performed following  the standard protocol during bolus administration of intravenous contrast. RADIATION DOSE REDUCTION: This exam was performed according  to the departmental dose-optimization program which includes automated exposure control, adjustment of the mA and/or kV according to patient size and/or use of iterative reconstruction technique. CONTRAST:  16mL OMNIPAQUE IOHEXOL 300 MG/ML  SOLN COMPARISON:  CT chest dated 03/18/2022. CTA chest abdomen pelvis dated 10/17/2021. FINDINGS: CT CHEST FINDINGS Cardiovascular: No evidence of thoracic aortic aneurysm. Mild atherosclerotic calcifications of the aortic arch. The heart is normal in size.  No pericardial effusion. Coronary atherosclerosis of the LAD and left circumflex. Mediastinum/Nodes: No suspicious mediastinal lymphadenopathy. Visualized thyroid is unremarkable. Lungs/Pleura: Endobronchial soft tissue in the proximal left upper lobe bronchus (series 2/image 34) and extending into the central left upper lobe (series 2/image 28), difficult to discretely measure but measuring approximately 4.9 cm in maximal craniocaudal dimension (coronal image 80) new. This corresponds to the patient's known primary bronchogenic neoplasm and is favored to be mildly progressive. Moderate centrilobular and paraseptal emphysematous changes, upper lung predominant. No focal consolidation. No pleural effusion or pneumothorax. Musculoskeletal: Moderate compression fracture deformity at T5 and T8, grossly unchanged. Mild superior endplate changes at A35. Cervical spine fixation hardware, incompletely visualized. CT ABDOMEN PELVIS FINDINGS Hepatobiliary: Liver is within normal limits. No suspicious/enhancing hepatic lesions. Gallbladder is underdistended but unremarkable. No intrahepatic or extrahepatic duct dilatation. Pancreas: Within normal limits. Spleen: Within normal limits. Adrenals/Urinary Tract: Stable 2.1 cm right adrenal nodule (series 2/image 68), unchanged from remote priors, favoring a benign adrenal adenoma. Left adrenal gland is within normal limits. Kidneys are notable for probable early excretory contrast in the  collecting systems. No hydronephrosis. Excretory contrast in the bladder. Stomach/Bowel: Mildly distended stomach. No evidence of bowel obstruction. Appendix is not discretely visualized. No colonic wall thickening or inflammatory changes. Vascular/Lymphatic: No evidence of abdominal aortic aneurysm. Atherosclerotic calcifications of the abdominal aorta and branch vessels. No suspicious abdominopelvic lymphadenopathy. Reproductive: Prostate is unremarkable. Other: No abdominopelvic ascites. Musculoskeletal: Moderate compression fracture deformity at L4, unchanged. Mild superior endplate compression fracture deformity at L2 and L5, new. Mild superior endplate changes at L1, unchanged. IMPRESSION: Stable central left upper lobe mass, corresponding to the patient's known primary bronchogenic neoplasm. No findings suspicious for metastatic disease. Stable benign right adrenal adenoma. Multiple thoracolumbar compression fracture deformities, with new mild compression fracture deformities at L2 and L5. Aortic Atherosclerosis (ICD10-I70.0) and Emphysema (ICD10-J43.9). Electronically Signed   By: Julian Hy M.D.   On: 05/08/2022 01:08   MR Brain W Wo Contrast  Result Date: 05/07/2022 EXAM: MRI HEAD WITHOUT AND WITH CONTRAST MRA HEAD WITHOUT CONTRAST TECHNIQUE: Multiplanar, multi-echo pulse sequences of the brain and surrounding structures were acquired without and with intravenous contrast. Angiographic images of the Circle of Willis were acquired using MRA technique without intravenous contrast. CONTRAST:  17mL GADAVIST GADOBUTROL 1 MMOL/ML IV SOLN COMPARISON:  Prior CT from 10/27/2021. FINDINGS: MRI HEAD FINDINGS Brain: Examination moderately degraded by motion artifact. Cerebral volume within normal limits. Patchy T2/FLAIR hyperintensity involving the periventricular deep white matter both cerebral hemispheres as well as the pons, most consistent with chronic small vessel ischemic disease, mild in nature. No  evidence for acute or subacute infarct. Gray-white matter differentiation maintained. No areas of chronic cortical infarction. No acute or chronic intracranial blood products. No mass lesion, midline shift or mass effect no hydrocephalus or extra-axial fluid collection. Pituitary gland suprasellar region normal. No abnormal enhancement. Vascular: Major intracranial vascular flow voids are maintained. Skull and upper cervical spine: Craniocervical junction  within normal limits. Bone marrow signal intensity normal. No focal marrow replacing lesion. No scalp soft tissue abnormality. Sinuses/Orbits: Prior bilateral ocular lens replacement. Mild scattered mucosal thickening with a few small retention cysts noted about the ethmoidal air cells and maxillary sinuses. No significant mastoid effusion. Other: None. MRA HEAD FINDINGS Anterior circulation: Examination moderately degraded by motion artifact. Both internal carotid arteries patent to the termini without significant stenosis or other appreciable abnormality. A1 segments patent bilaterally. Grossly normal anterior communicating artery complex. Both ACAs grossly patent to their distal aspects without appreciable stenosis. No visible M1 stenosis or occlusion. No proximal MCA branch occlusion. Distal MCA branches grossly perfused and symmetric. Posterior circulation: Visualized distal V4 segments patent without stenosis. Right vertebral artery dominant. Neither PICA well visualized. Basilar patent to its distal aspect without stenosis. Superior cerebellar and posterior cerebral arteries patent bilaterally with no appreciable significant stenosis. Anatomic variants: None significant. IMPRESSION: MRI HEAD IMPRESSION: 1. Motion artifact degraded exam. 2. No acute intracranial abnormality. No evidence for intracranial metastatic disease. 3. Mild chronic microvascular ischemic disease. MRA HEAD IMPRESSION: 1. Motion degraded exam. 2. Grossly negative intracranial MRA. No  large vessel occlusion, hemodynamically significant stenosis, or other acute vascular abnormality. Electronically Signed   By: Jeannine Boga M.D.   On: 05/07/2022 20:36   MR ANGIO HEAD WO CONTRAST  Result Date: 05/07/2022 EXAM: MRI HEAD WITHOUT AND WITH CONTRAST MRA HEAD WITHOUT CONTRAST TECHNIQUE: Multiplanar, multi-echo pulse sequences of the brain and surrounding structures were acquired without and with intravenous contrast. Angiographic images of the Circle of Willis were acquired using MRA technique without intravenous contrast. CONTRAST:  51mL GADAVIST GADOBUTROL 1 MMOL/ML IV SOLN COMPARISON:  Prior CT from 10/27/2021. FINDINGS: MRI HEAD FINDINGS Brain: Examination moderately degraded by motion artifact. Cerebral volume within normal limits. Patchy T2/FLAIR hyperintensity involving the periventricular deep white matter both cerebral hemispheres as well as the pons, most consistent with chronic small vessel ischemic disease, mild in nature. No evidence for acute or subacute infarct. Gray-white matter differentiation maintained. No areas of chronic cortical infarction. No acute or chronic intracranial blood products. No mass lesion, midline shift or mass effect no hydrocephalus or extra-axial fluid collection. Pituitary gland suprasellar region normal. No abnormal enhancement. Vascular: Major intracranial vascular flow voids are maintained. Skull and upper cervical spine: Craniocervical junction within normal limits. Bone marrow signal intensity normal. No focal marrow replacing lesion. No scalp soft tissue abnormality. Sinuses/Orbits: Prior bilateral ocular lens replacement. Mild scattered mucosal thickening with a few small retention cysts noted about the ethmoidal air cells and maxillary sinuses. No significant mastoid effusion. Other: None. MRA HEAD FINDINGS Anterior circulation: Examination moderately degraded by motion artifact. Both internal carotid arteries patent to the termini without  significant stenosis or other appreciable abnormality. A1 segments patent bilaterally. Grossly normal anterior communicating artery complex. Both ACAs grossly patent to their distal aspects without appreciable stenosis. No visible M1 stenosis or occlusion. No proximal MCA branch occlusion. Distal MCA branches grossly perfused and symmetric. Posterior circulation: Visualized distal V4 segments patent without stenosis. Right vertebral artery dominant. Neither PICA well visualized. Basilar patent to its distal aspect without stenosis. Superior cerebellar and posterior cerebral arteries patent bilaterally with no appreciable significant stenosis. Anatomic variants: None significant. IMPRESSION: MRI HEAD IMPRESSION: 1. Motion artifact degraded exam. 2. No acute intracranial abnormality. No evidence for intracranial metastatic disease. 3. Mild chronic microvascular ischemic disease. MRA HEAD IMPRESSION: 1. Motion degraded exam. 2. Grossly negative intracranial MRA. No large vessel occlusion, hemodynamically significant stenosis, or other  acute vascular abnormality. Electronically Signed   By: Jeannine Boga M.D.   On: 05/07/2022 20:36   DG Chest Port 1 View  Result Date: 05/06/2022 CLINICAL DATA:  Shortness of breath EXAM: PORTABLE CHEST 1 VIEW COMPARISON:  04/29/2022 radiograph and 03/18/2022 chest CT FINDINGS: Chronic hyperinflation and interstitial coarsening. Biapical lucency. Vague rounded density over the right chest correlates with rib fracture callus by CT 2 months ago. Stable appearance of left supra hilum where there is endobronchial mass and airway obstruction by comparison CT. Normal heart size and mediastinal contours. IMPRESSION: COPD without acute superimposed finding. Electronically Signed   By: Jorje Guild M.D.   On: 05/06/2022 04:49   DG Chest Port 1 View  Result Date: 04/29/2022 CLINICAL DATA:  Shortness of breath and hypoxemia. EXAM: PORTABLE CHEST 1 VIEW COMPARISON:  Portable chest  04/22/2022. FINDINGS: The lungs are emphysematous and clear of acute infiltrates. There is scattered linear scarring. Heart size and vasculature are normal with stable mediastinal silhouette. Mild aortic atherosclerosis. Osteopenia. IMPRESSION: Stable COPD chest with no acute cardiopulmonary findings. Electronically Signed   By: Telford Nab M.D.   On: 04/29/2022 03:14   DG Chest Port 1 View  Result Date: 04/22/2022 CLINICAL DATA:  Progressive shortness of breath. Personal history of COPD. EXAM: PORTABLE CHEST 1 VIEW COMPARISON:  One-view chest x-ray 04/12/2022 FINDINGS: Heart size is normal. Changes of COPD are noted. No edema or effusion is present. No focal airspace disease is present. IMPRESSION: 1. COPD. 2. No acute cardiopulmonary disease. Electronically Signed   By: San Morelle M.D.   On: 04/22/2022 14:06    Orson Eva, DO  [facility]  If 7PM-7AM, please contact night-coverage [email or web address] Password [facility] 05/22/2022, 9:14 AM   LOS: 1 day

## 2022-05-22 NOTE — Hospital Course (Signed)
61 year old male with a history of COPD, chronic respiratory failure on 4.5 L at home, anxiety/depression, hypertension, pulmonary embolus (03/13/2022 CTA chest) on apixaban, NSCLC diagnosed 03/22/2022 via bronchoscopy (Dr. Elsworth Soho) presenting with worsening shortness of breath over the past 2 to 3 days.  The patient has had numerous hospital admissions with similar presentations.  It appears that the patient has poor social circumstances and poor ability to take care of himself.  The patient was admitted to the hospital from 04/30/2022 to 05/02/2022 secondary to COPD exacerbation.  Once again, he was admitted from 05/06/2022 to 05/08/2022 with a similar presentation.  He was discharged home with prednisone taper. Upon presentation, the patient was tachypneic with oxygen saturation 90% on 6 L.  He was started on IV Solu-Medrol, DuoNebs.  General surgery was consulted for his wounds.

## 2022-05-22 NOTE — TOC Initial Note (Signed)
Transition of Care [facility]) - Initial/Assessment Note    Patient Details  Name: Tony Sullivan MRN: [id number] Date of Birth: Aug 12, 1961  [facility]) CM/SW Contact:    Ihor Gully, LCSW Phone Number: 05/22/2022, 1:25 PM  Clinical Narrative:                 Patient from home. Well known to TOC due to multiple admissions. Attempted to speak with patient regarding d/c plan (home vs. SNF). Patient requested to discuss d/c plan at a later time.   Expected Discharge Plan: Skilled Nursing Facility Barriers to Discharge: Continued Medical Work up   Patient Goals and CMS Choice        Expected Discharge Plan and Services Expected Discharge Plan: [place]                                              Prior Living Arrangements/Services                       Activities of Daily Living Home Assistive Devices/Equipment: None ADL Screening (condition at time of admission) Patient's cognitive ability adequate to safely complete daily activities?: No Is the patient deaf or have difficulty hearing?: No Does the patient have difficulty seeing, even when wearing glasses/contacts?: No Does the patient have difficulty concentrating, remembering, or making decisions?: No Patient able to express need for assistance with ADLs?: No Does the patient have difficulty dressing or bathing?: Yes Independently performs ADLs?: No Communication: Independent Is this a change from baseline?: Pre-admission baseline Dressing (OT): Needs assistance Is this a change from baseline?: Pre-admission baseline Grooming: Needs assistance Is this a change from baseline?: Pre-admission baseline Feeding: Needs assistance Is this a change from baseline?: Pre-admission baseline Bathing: Needs assistance Is this a change from baseline?: Pre-admission baseline Toileting: Needs assistance Is this a change from baseline?: Pre-admission baseline In/Out Bed: Needs  assistance Is this a change from baseline?: Pre-admission baseline Walks in Home: Needs assistance Is this a change from baseline?: Pre-admission baseline Does the patient have difficulty walking or climbing stairs?: Yes Weakness of Legs: Both Weakness of Arms/Hands: None  Permission Sought/Granted                  Emotional Assessment              Admission diagnosis:  SOB (shortness of breath) [R06.02] COPD exacerbation ([place]) [J44.1] COPD with acute exacerbation ([place]) [J44.1] Patient Active Problem List   Diagnosis Date Noted   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[place])    Acute and chronic respiratory failure with hypoxia ([place]) 05/06/2022   Blistered skin right foot/left leg 05/06/2022   Acquired thrombophilia ([place]) 05/06/2022   Pressure injury of skin 05/06/2022   Acute on chronic respiratory failure with hypoxia and hypercapnia ([place]) 04/29/2022   Hyperkalemia 04/29/2022   AKI (acute kidney injury) ([place]) 04/29/2022   Non-small cell lung cancer ([place]) 04/29/2022   History of pulmonary embolus ([place]) 04/29/2022   H/o Pulmonary embolism ([place]) 04/01/2022   Mass of upper lobe of left lung    Thoracic compression fracture ([place]) 03/20/2022   Hemoptysis 03/20/2022   Pneumonia 03/13/2022   COPD exacerbation ([place]) 02/27/2022   Pedal edema 02/19/2022   CAP (community acquired pneumonia) 01/07/2022   Prolonged QT interval 01/07/2022   Glaucoma 01/07/2022  Elevated troponin 10/27/2021   Hyperglycemia 10/27/2021   Elevated MCV 10/27/2021   Respiratory failure with hypoxia and hypercapnia ([place]) 10/27/2021   Acute exacerbation of chronic obstructive pulmonary disease (COPD) ([place]) 10/02/2021   Hypophosphatemia 10/02/2021   Hypoalbuminemia due to protein-calorie malnutrition ([place]) 10/02/2021   Physical deconditioning 07/26/2021   Numbness and tingling in left arm 05/06/2021   Leukocytosis 05/05/2021   Severe headache 05/05/2021   Mixed hyperlipidemia  05/24/2020   Hypokalemia 05/24/2020   Chest pain 05/23/2020   Bipolar disorder ([place]) 05/23/2020   HNP (herniated nucleus pulposus), cervical 09/06/2019   Abdominal pain    Poor dentition 11/04/2017   Acute on chronic respiratory failure with hypercapnia ([place]) 08/29/2017   ETOH abuse 03/30/2017   Cigarette smoker 03/30/2017   Acute respiratory failure with hypoxia and hypercapnia (H[facility]) 03/29/2017   Special screening for malignant neoplasms, colon    Benign neoplasm of ascending colon    Benign neoplasm of descending colon    Hoarseness 08/22/2016   GERD (gastroesophageal reflux disease) 08/22/2016   Atypical chest pain 12/05/2015   Essential hypertension 06/15/2014   Pectoralis muscle strain 01/11/2014   COPD (chronic obstructive pulmonary disease) ([place]) 01/08/2014   Tobacco abuse 01/08/2014   Chronic Cough 01/08/2014   PCP:  [name], FNP Pharmacy:   [place], [address] AT [address] [place] [address] Phone: [phone] Fax: [phone]     Social Determinants of Health (SDOH) Interventions    Readmission Risk Interventions    05/06/2022   11:49 AM 05/01/2022    7:51 AM 03/14/2022    3:54 PM  Readmission Risk Prevention Plan  Transportation Screening Complete Complete Complete  Medication Review Press photographer) Complete Complete Complete  PCP or Specialist appointment within 3-5 days of discharge   Not Complete  HRI or [place] Complete Complete Complete  SW Recovery Care/Counseling Consult Complete Complete Complete  Palliative Care Screening Not Applicable Not Applicable Not [place] Not Applicable Not Applicable Patient Refused

## 2022-05-22 NOTE — Assessment & Plan Note (Signed)
Resume home meds ?

## 2022-05-22 NOTE — [place] FL2 (Signed)
[place] MEDICAID FL2 LEVEL OF CARE SCREENING TOOL     IDENTIFICATION  Patient Name: Tony Sullivan Birthdate: [birth year] Sex: male Admission Date (Current Location): 05/21/2022  [place] and [place] Number:  [place] [facility] [id number] [place] and Address:  [facility] [address] [place]      Provider Number: [id number]  Attending Physician Name and Address:  Orson Eva, MD  Relative Name and Phone Number:  [name] (Daughter)   [phone]    Current Level of Care: Hospital Recommended Level of Care: [place] Prior Approval Number:    Date Approved/Denied:   PASRR Number:    Discharge Plan: SNF    Current Diagnoses: Patient Active Problem List   Diagnosis Date Noted   Decubitus ulcer of back, stage 2 ([place])    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[place])    Acute and chronic respiratory failure with hypoxia ([place]) 05/06/2022   Blistered skin right foot/left leg 05/06/2022   Acquired thrombophilia ([place]) 05/06/2022   Pressure injury of skin 05/06/2022   Acute on chronic respiratory failure with hypoxia and hypercapnia ([place]) 04/29/2022   Hyperkalemia 04/29/2022   AKI (acute kidney injury) ([place]) 04/29/2022   Non-small cell lung cancer ([place]) 04/29/2022   History of pulmonary embolus (PE) 04/29/2022   H/o Pulmonary embolism ([place]) 04/01/2022   Mass of upper lobe of left lung    Thoracic compression fracture ([place]) 03/20/2022   Hemoptysis 03/20/2022   Pneumonia 03/13/2022   COPD exacerbation ([place]) 02/27/2022   Pedal edema 02/19/2022   CAP (community acquired pneumonia) 01/07/2022   Prolonged QT interval 01/07/2022   Glaucoma 01/07/2022   Elevated troponin 10/27/2021   Hyperglycemia 10/27/2021   Elevated MCV 10/27/2021   Respiratory failure with hypoxia and hypercapnia ([place]) 10/27/2021   Acute exacerbation of chronic obstructive pulmonary disease (COPD) ([place]) 10/02/2021   Hypophosphatemia 10/02/2021    Hypoalbuminemia due to protein-calorie malnutrition ([place]) 10/02/2021   Physical deconditioning 07/26/2021   Numbness and tingling in left arm 05/06/2021   Leukocytosis 05/05/2021   Severe headache 05/05/2021   Mixed hyperlipidemia 05/24/2020   Hypokalemia 05/24/2020   Chest pain 05/23/2020   Bipolar disorder ([place]) 05/23/2020   HNP (herniated nucleus pulposus), cervical 09/06/2019   Abdominal pain    Poor dentition 11/04/2017   Acute on chronic respiratory failure with hypercapnia ([place]) 08/29/2017   ETOH abuse 03/30/2017   Cigarette smoker 03/30/2017   Acute respiratory failure with hypoxia and hypercapnia (HCC) 03/29/2017   Special screening for malignant neoplasms, colon    Benign neoplasm of ascending colon    Benign neoplasm of descending colon    Hoarseness 08/22/2016   GERD (gastroesophageal reflux disease) 08/22/2016   Atypical chest pain 12/05/2015   Essential hypertension 06/15/2014   Pectoralis muscle strain 01/11/2014   COPD (chronic obstructive pulmonary disease) ([place]) 01/08/2014   Tobacco abuse 01/08/2014   Chronic Cough 01/08/2014    Orientation RESPIRATION BLADDER Height & Weight     Self, Time, Situation, Place  O2 Incontinent Weight: 129 lb 10.1 oz (58.8 kg) Height:  5\' 11"  (180.3 cm)  BEHAVIORAL SYMPTOMS/MOOD NEUROLOGICAL BOWEL NUTRITION STATUS      Incontinent Diet (see d/c summary)  AMBULATORY STATUS COMMUNICATION OF NEEDS Skin   Total Care Verbally PU Stage and Appropriate Care (unstagable vertebrtal column; sacrum unstagable, pretibial; foot anterior right open blister, tibial left stage 2 posterior)  Personal Care Assistance Level of Assistance  Bathing, Feeding, Dressing Bathing Assistance: Maximum assistance Feeding assistance: Independent Dressing Assistance: Maximum assistance     Functional Limitations Info  Sight, Hearing, Speech Sight Info: Adequate Hearing Info: Adequate Speech Info: Adequate    SPECIAL CARE  FACTORS FREQUENCY  PT (By licensed PT)     PT Frequency: 5x/week              Contractures Contractures Info: Not present    Additional Factors Info  Code Status, Allergies, Psychotropic Code Status Info: Full code Allergies Info: Penicillins, levocetirizine Psychotropic Info: Remeron         Current Medications (05/22/2022):  This is the current hospital active medication list Current Facility-Administered Medications  Medication Dose Route Frequency Provider Last Rate Last Admin   acetaminophen (TYLENOL) tablet 650 mg  650 mg Oral Q6H PRN Emokpae, Ejiroghene E, MD       Or   acetaminophen (TYLENOL) suppository 650 mg  650 mg Rectal Q6H PRN Emokpae, Ejiroghene E, MD       arformoterol (BROVANA) nebulizer solution 15 mcg  15 mcg Nebulization BID Tat, Shanon Brow, MD   15 mcg at 05/22/22 1047   atorvastatin (LIPITOR) tablet 40 mg  40 mg Oral Daily Emokpae, Ejiroghene E, MD   40 mg at 05/22/22 0823   budesonide (PULMICORT) nebulizer solution 0.5 mg  0.5 mg Nebulization BID Emokpae, Ejiroghene E, MD   0.5 mg at 05/22/22 0747   Chlorhexidine Gluconate Cloth 2 % PADS 6 each  6 each Topical Daily Zierle-Ghosh, Asia B, DO   6 each at 05/22/22 0824   collagenase (SANTYL) ointment   Topical Daily Tat, David, MD   Given at 05/22/22 0853   gabapentin (NEURONTIN) capsule 100 mg  100 mg Oral TID Emokpae, Ejiroghene E, MD   100 mg at 05/22/22 1539   guaiFENesin-dextromethorphan (ROBITUSSIN DM) 100-10 MG/5ML syrup 10 mL  10 mL Oral Q8H Emokpae, Ejiroghene E, MD   10 mL at 05/22/22 1308   ipratropium-albuterol (DUONEB) 0.5-2.5 (3) MG/3ML nebulizer solution 3 mL  3 mL Nebulization Q4H PRN Emokpae, Ejiroghene E, MD       ipratropium-albuterol (DUONEB) 0.5-2.5 (3) MG/3ML nebulizer solution 3 mL  3 mL Nebulization Q6H Tat, Shanon Brow, MD   3 mL at 05/22/22 1500   [START ON 05/23/2022] levofloxacin (LEVAQUIN) IVPB 500 mg  500 mg Intravenous Q24H Bryk, Veronda P, RPH       methylPREDNISolone sodium succinate  (SOLU-MEDROL) 125 mg/2 mL injection 60 mg  60 mg Intravenous Q12H Tat, Shanon Brow, MD       morphine (PF) 2 MG/ML injection 2 mg  2 mg Intravenous Q4H PRN Emokpae, Ejiroghene E, MD   2 mg at 05/22/22 1312   pantoprazole (PROTONIX) EC tablet 40 mg  40 mg Oral BID Emokpae, Ejiroghene E, MD   40 mg at 05/22/22 0824   polyethylene glycol (MIRALAX / GLYCOLAX) packet 17 g  17 g Oral Daily PRN Emokpae, Ejiroghene E, MD       [START ON 05/23/2022] revefenacin (YUPELRI) nebulizer solution 175 mcg  175 mcg Nebulization Daily Tat, David, MD       silver sulfADIAZINE (SILVADENE) 1 % cream   Topical Daily Tat, David, MD   Given at 05/22/22 0853   vancomycin (VANCOREADY) IVPB 750 mg/150 mL  750 mg Intravenous Therisa Doyne, MD 150 mL/hr at 05/22/22 1545 Infusion Verify at 05/22/22 1545     Discharge Medications: Please see discharge summary for a list of  discharge medications.  Relevant Imaging Results:  Relevant Lab Results:   Additional Information SSN: [id number], Pt needs bipap for sleep  Kemon Devincenzi D, LCSW

## 2022-05-22 NOTE — Evaluation (Signed)
Physical Therapy Evaluation Patient Details Name: Tony Sullivan MRN: [id number] DOB: 26-Jun-1961 Today's Date: 05/22/2022  History of Present Illness  Per DR. Emopake,"HPI: Tony Sullivan is a 61 y.o. male with medical history significant for left lung non-small cell lung cancer, pulmonary embolism, COPD with chronic respiratory failure on 5 L and BiPAP nightly, alcohol abuse and bipolar polar disorder.  Patient presented to the ED with complaints of difficulty breathing over the past 2 days.  Patient answered some questions, but tells me he is not ready to answer many questions at this time. He has a chronic cough is unable to tell me if this is worse.  Patient lives alone.  Ambulates with a wheelchair at home.  Per ED provider, he was found in his feces and smelled of urine on presentation to the ED.  Patient is on home O2, 5 L, he checked his O2 sats at home and it was 79%, he is unable to tell me if this was with his oxygen or without.   He reports multiple wounds to his lower extremities and back.  With significant pain in his back.  He tells me he has had a wound on his back for about a week. "  Clinical Impression  Pt refuses to sit up due to sore on his back.  Therapist explained that this is exactly why he needs to get up pt refuses stating he would try tomorrow.        Recommendations for follow up therapy are one component of a multi-disciplinary discharge planning process, led by the attending physician.  Recommendations may be updated based on patient status, additional functional criteria and insurance authorization.  Follow Up Recommendations Skilled nursing-short term rehab (<3 hours/day) Can patient physically be transported by private vehicle: No    Assistance Recommended at Discharge  Mod assist   Patient can return home with the following  A lot of help with walking and/or transfers    Equipment Recommendations None recommended by PT  Recommendations for Other Services        Functional Status Assessment Patient has had a recent decline in their functional status and demonstrates the ability to make significant improvements in function in a reasonable and predictable amount of time.     Precautions / Restrictions Precautions Precautions: Fall Restrictions Weight Bearing Restrictions: No      Mobility  Bed Mobility               General bed mobility comments: PT to fatigued following exercise; states that he is done for today.           Pertinent Vitals/Pain Pain Assessment Pain Assessment: No/denies pain    Home Living Family/patient expects to be discharged to:: Skilled nursing facility Living Arrangements: Alone                 Additional Comments: aide available 3hr/day 7 days/week;    Prior Function Prior Level of Function : Needs assist             Mobility Comments: Mod Indep transfers to wheelchair, does not ambulate       Hand Dominance        Extremity/Trunk Assessment        Lower Extremity Assessment Lower Extremity Assessment: Generalized weakness       Communication   Communication: No difficulties  Cognition Arousal/Alertness: Awake/alert   Overall Cognitive Status: Within Functional Limits for tasks assessed  Exercises General Exercises - Lower Extremity Ankle Circles/Pumps: Both, AROM, 10 reps Quad Sets: Both, 10 reps Short Arc Quad: Both, 10 reps Heel Slides: Both, 10 reps Hip ABduction/ADduction: Both, 10 reps   Assessment/Plan    PT Assessment Patient needs continued PT services  PT Problem List Decreased strength;Decreased activity tolerance;Decreased balance;Decreased mobility;Decreased skin integrity;Pain       PT Treatment Interventions Therapeutic activities;Therapeutic exercise;Balance training;Functional mobility training    PT Goals (Current goals can be found in the Care Plan section)  Acute Rehab PT  Goals Patient Stated Goal: to be able to transfer safely PT Goal Formulation: With patient Time For Goal Achievement: 06/06/22 Potential to Achieve Goals: Good    Frequency Min 3X/week        AM-PAC PT "6 Clicks" Mobility  Outcome Measure Help needed turning from your back to your side while in a flat bed without using bedrails?: A Lot Help needed moving from lying on your back to sitting on the side of a flat bed without using bedrails?: A Lot Help needed moving to and from a bed to a chair (including a wheelchair)?: A Lot Help needed standing up from a chair using your arms (e.g., wheelchair or bedside chair)?: A Lot Help needed to walk in hospital room?: A Lot Help needed climbing 3-5 steps with a railing? : A Lot 6 Click Score: 12    End of Session Equipment Utilized During Treatment: Oxygen (Pt initally stated he could not do anymore after ankle pumps and heelslides.  Respiratory therapist convinced pt to attempt to continue exercising with a breathing treatment.  Pt continued to exericse with the breathing treatment but refused to sit .) Activity Tolerance: Patient limited by fatigue Patient left: in bed;with call bell/phone within reach;with family/visitor present;Other (comment) (respiratory therapy in room finishing treatment)   PT Visit Diagnosis: Muscle weakness (generalized) (M62.81);History of falling (Z91.81);Difficulty in walking, not elsewhere classified (R26.2)    Time: 1500-1530 PT Time Calculation (min) (ACUTE ONLY): 30 min   Charges:   PT Evaluation $PT Eval Low Complexity: 1 Low PT Treatments $Therapeutic Exercise: 8-22 mins       Rayetta Humphrey, PT CLT [phone]  05/22/2022, 3:32 PM

## 2022-05-22 NOTE — Assessment & Plan Note (Signed)
-   Reportedly recently quit tobacco however with his history of poor compliance were not sure how much he is smoking at this time

## 2022-05-22 NOTE — Progress Notes (Signed)
Report called and given to [facility], [facility] on Dept. [address]. Pt to be transported via bed to room 321.
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
## 2022-05-22 NOTE — Consult Note (Signed)
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 or loculated collections. No convincing evidence for osteomyelitis discitis or septic arthritis. Visualized visceral structures within normal limits. Disc levels: L1-2: Negative interspace. Mild facet hypertrophy. No significant stenosis. L2-3: Mild disc bulge with trace bony retropulsion. Mild facet hypertrophy. No significant spinal stenosis. Foramina remain patent. L3-4: Disc bulge with disc desiccation. Superimposed small left foraminal disc protrusion closely approximates the exiting left L3 nerve root (series 5, image 16). Mild facet hypertrophy. No significant spinal stenosis. Foramina remain patent. L4-5: Mild disc bulge with disc desiccation and trace bony retropulsion. Mild facet hypertrophy. No significant spinal stenosis. Moderate right greater than left L4 foraminal narrowing. L5-S1: Mild diffuse disc bulge. Moderate facet hypertrophy with associated small joint  effusions. No significant spinal stenosis. Moderate right with mild-to-moderate left L5 foraminal narrowing. S1-2: Rudimentary S1-2 interspace. No disc bulge or focal disc herniation. No stenosis. IMPRESSION: 1. Multiple acute to subacute compression fractures involving the T5, T6, T7, T8, T9, T11, T12, L2, L3, L4, L5, and S1 as detailed above. No more than trace bony retropulsion at several levels without significant spinal stenosis. 2. Soft tissue ulceration overlying the lower back at the level of the spinous processes of L1 through L3. Diffuse edema throughout the underlying posterior paraspinous soft tissues without loculated or drainable fluid collection. No convincing evidence for osteomyelitis discitis or septic arthritis within the thoracolumbar spine itself. 3. Mild multilevel lumbar spondylosis without significant spinal stenosis. Moderate bilateral L4 and L5 foraminal narrowing. 4. Patchy consolidative opacity within the posterior left lung base, either atelectasis or infiltrate. Electronically Signed   By: Jeannine Boga M.D.   On: 05/21/2022 20:06   DG Chest Port 1 View  Result Date: 05/21/2022 CLINICAL DATA:  Shortness of breath.  Smoker. EXAM: PORTABLE CHEST 1 VIEW COMPARISON:  05/15/2022 FINDINGS: Normal sized heart. Clear lungs. The lungs remain mildly hyperexpanded. Previously described callus associated with the right posterior 6th rib, compatible with an old, healed fracture. IMPRESSION: No acute abnormality.  Stable mild changes of COPD. Electronically Signed   By: Claudie Revering M.D.   On: 05/21/2022 15:44    ROS:  Pertinent items are noted in HPI.  Blood pressure 126/82, pulse 94, temperature 98.3 F (36.8 C), temperature source Oral, resp. rate 14, height _0  (1.803 m), weight 58.8 kg, SpO2 100 %. Physical Exam: Black male lying in the bed in no acute distress Head is normocephalic, atraumatic A partial-thickness wound is noted in the upper mid back.  No extending to the  bone was noted.  No purulent drainage is noted.  A small amount of necrotic tissue is noted in the upper portion of the wound, though this appears to be superficial in nature.  No muscle or bone are exposed. MRI report reviewed.  No drainable abscess cavities are identified in the subcutaneous tissue.  No significant surrounding erythema noted. Assessment/Plan: Impression: Clinical stage 2-3 back decubitus ulcer.  It is hard to determine exactly when the patient last took his anticoagulation medication.  At this point, no need for surgical debridement at this time.  Would continue Santyl as ordered.  I will follow this with you.  Aviva Signs 05/22/2022, 1:33 PM

## 2022-05-23 DIAGNOSIS — Z86711 Personal history of pulmonary embolism: Secondary | ICD-10-CM | POA: Diagnosis not present

## 2022-05-23 DIAGNOSIS — L89102 Pressure ulcer of unspecified part of back, stage 2: Secondary | ICD-10-CM

## 2022-05-23 DIAGNOSIS — C3492 Malignant neoplasm of unspecified part of left bronchus or lung: Secondary | ICD-10-CM | POA: Diagnosis not present

## 2022-05-23 DIAGNOSIS — Z720 Tobacco use: Secondary | ICD-10-CM | POA: Diagnosis not present

## 2022-05-23 DIAGNOSIS — C3490 Malignant neoplasm of unspecified part of unspecified bronchus or lung: Secondary | ICD-10-CM | POA: Diagnosis not present

## 2022-05-23 DIAGNOSIS — J441 Chronic obstructive pulmonary disease with (acute) exacerbation: Secondary | ICD-10-CM | POA: Diagnosis not present

## 2022-05-23 MED ORDER — GUAIFENESIN ER 600 MG PO TB12
1200.0000 mg | ORAL_TABLET | Freq: Two times a day (BID) | ORAL | Status: DC
  Administered 2022-05-23 – 2022-05-28 (×10): 1200 mg via ORAL
  Filled 2022-05-23 (×10): qty 2

## 2022-05-23 MED ORDER — ALBUTEROL SULFATE (2.5 MG/3ML) 0.083% IN NEBU
3.0000 mL | INHALATION_SOLUTION | RESPIRATORY_TRACT | Status: DC | PRN
  Administered 2022-05-26: 3 mL via RESPIRATORY_TRACT
  Filled 2022-05-23: qty 3

## 2022-05-23 MED ORDER — LATANOPROST 0.005 % OP SOLN
1.0000 [drp] | Freq: Every day | OPHTHALMIC | Status: DC
Start: 2022-05-23 — End: 2022-05-29
  Administered 2022-05-23 – 2022-05-27 (×4): 1 [drp] via OPHTHALMIC
  Filled 2022-05-23: qty 2.5

## 2022-05-23 MED ORDER — DORZOLAMIDE HCL 2 % OP SOLN
1.0000 [drp] | Freq: Two times a day (BID) | OPHTHALMIC | Status: DC
Start: 2022-05-23 — End: 2022-05-29
  Administered 2022-05-23 – 2022-05-28 (×9): 1 [drp] via OPHTHALMIC
  Filled 2022-05-23: qty 10

## 2022-05-23 MED ORDER — APIXABAN 5 MG PO TABS
5.0000 mg | ORAL_TABLET | Freq: Two times a day (BID) | ORAL | Status: DC
  Administered 2022-05-23 – 2022-05-28 (×10): 5 mg via ORAL
  Filled 2022-05-23 (×10): qty 1

## 2022-05-23 NOTE — Progress Notes (Signed)
  This encounter was created in error - please disregard.  This encounter was created in error - please disregard.  This encounter was created in error - please disregard.  This encounter was created in error - please disregard. 

## 2022-05-23 NOTE — Progress Notes (Signed)
Pt is refusing for this nurse to do wound care. He stated "My breathing isnt good right now, Lets wait until tomorrow."

## 2022-05-23 NOTE — Progress Notes (Signed)
PROGRESS NOTE  Tony Sullivan JQB:[id number] DOB: 1961/02/12 DOA: 05/21/2022 PCP: [name], FNP  Brief History:   61 year old male with a history of COPD, chronic respiratory failure on 4.5 L at home, anxiety/depression, hypertension, pulmonary embolus (03/13/2022 CTA chest) on apixaban, NSCLC diagnosed 03/22/2022 via bronchoscopy (Dr. Elsworth Soho) presenting with worsening shortness of breath over the past 2 to 3 days.  The patient has had numerous hospital admissions with similar presentations.  It appears that the patient has poor social circumstances and poor ability to take care of himself.  The patient was admitted to the hospital from 04/30/2022 to 05/02/2022 secondary to COPD exacerbation.  Once again, he was admitted from 05/06/2022 to 05/08/2022 with a similar presentation.  He was discharged home with prednisone taper. Upon presentation, the patient was tachypneic with oxygen saturation 90% on 6 L.  He was started on IV Solu-Medrol, DuoNebs.  General surgery was consulted for his wounds.      Assessment and Plan: * Acute exacerbation of chronic obstructive pulmonary disease (COPD) ([place]) Currently stable on 4 L continue DuoNebs and Brovana Continue Pulmicort Continue IV Solu-Medrol Added Yupelri --aim for oxygen saturation 88-90% Morphine prn sob  Pressure injury of skin Multiple ulcers, some of which do not appear to be pressure injury related and some appear infected, but without surrounding signs of cellulitis. 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  He is afebrile without leukocytosis, and rules out for sepsis at this time. -MRI thoracic and lumbar spine--neg for osteo, discitis, abscess -appreciate wound care RN consult -Wound care consult for lower extremity ulcers -general surgery eval>>no   need for surgical debridement at this time.  Continue Santyl as ordered  Decubitus ulcer of back, stage 2 ([place]) As  above  History of pulmonary embolus (PE) Restart apixaban  Non-small cell lung cancer ([place]) Seen on CT 03/13/2022  -patient had bronchoscopy 5/26, showed endobronchial mass blocking takeoff of left upper lobe bronchus. 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   Patient has missed several appointments with oncology as outpatient for follow-up on his newly diagnosed lung cancer.   Dr. Delton Coombes was consulted during his recent hospitalization, recommended CAP CT With contrast and MRI brain (neither was suspicious for metastatic disease, though MRI was motion degraded ) for further staging of his large cell cancer, he was to follow-up after scans to discuss plan. --discussed with Dr. Delton Coombes 7/27>>no chemo, needs XRT which Dr. Raliegh Ip will help set up --discussed with Dr. Kenna Gilbert for 10 XRT treatments after d/c  Acute on chronic respiratory failure with hypoxia and hypercapnia ([place]) Chronically on 5 L nasal cannula Initially required 6 L nasal cannula with oxygen saturation 90% Wean oxygen as tolerated Secondary to COPD exacerbation --aim for oxygen saturation 88-92% --morphine prn sob  Bipolar disorder ([place]) Resume home meds  Tobacco abuse - Continues to smoke 1-2 packs per week per patient          Family Communication:  no Family at bedside  Consultants:  med/onc  Code Status:  FULL   DVT Prophylaxis:  apixaban   Procedures: As Listed in Progress Note Above  Antibiotics: None  RN Pressure Injury Documentation: Pressure Injury 05/06/22 Vertebral column Lower Unstageable - Full thickness tissue loss in which the base of the injury is covered by slough (yellow, tan, gray, green or brown) and/or eschar (tan, brown  or black) in the wound bed. some smaller Unstageab (Active)  05/06/22 1130  Location: Vertebral column  Location Orientation: Lower  Staging: Unstageable - Full thickness tissue loss in which the base of the injury is covered  by slough (yellow, tan, gray, green or brown) and/or eschar (tan, brown or black) in the wound bed.  Wound Description (Comments): some smaller Unstageable wounds surround the larger one also  Present on Admission: Yes  Dressing Type Foam - Lift dressing to assess site every shift 05/22/22 1956     Pressure Injury 05/06/22 Sacrum Medial Unstageable - Full thickness tissue loss in which the base of the injury is covered by slough (yellow, tan, gray, green or brown) and/or eschar (tan, brown or black) in the wound bed. (Active)  05/06/22 1130  Location: Sacrum  Location Orientation: Medial  Staging: Unstageable - Full thickness tissue loss in which the base of the injury is covered by slough (yellow, tan, gray, green or brown) and/or eschar (tan, brown or black) in the wound bed.  Wound Description (Comments):   Present on Admission: Yes  Dressing Type Foam - Lift dressing to assess site every shift 05/22/22 1956        Subjective: Patient continues to complain of sob, only a little better.  Denies f/c, cp, n/v/d  Objective: Vitals:   05/23/22 0820 05/23/22 1227 05/23/22 1355 05/23/22 1600  BP:  (!) 151/101  (!) 152/95  Pulse:  86  (!) 103  Resp:  19  20  Temp:  98.2 F (36.8 C)  97.9 F (36.6 C)  TempSrc:    Oral  SpO2: 100% 99% 98% 100%  Weight:      Height:        Intake/Output Summary (Last 24 hours) at 05/23/2022 1851 Last data filed at 05/23/2022 1837 Gross per 24 hour  Intake 1012.32 ml  Output 650 ml  Net 362.32 ml   Weight change:  Exam:  General:  Pt is alert, follows commands appropriately, not in acute distress HEENT: No icterus, No thrush, No neck mass, [place]/AT Cardiovascular: RRR, S1/S2, no rubs, no gallops Respiratory: diminished BS.  Bibasilar wheeze Abdomen: Soft/+BS, non tender, non distended, no guarding Extremities: No edema, No lymphangitis, No petechiae, No rashes, no synovitis   Data Reviewed: I have personally reviewed following labs and  imaging studies Basic Metabolic Panel: Recent Labs  Lab 05/21/22 1452 05/22/22 0634  NA 140 144  K 4.1 4.1  CL 100 102  CO2 33* 34*  GLUCOSE 120* 115*  BUN 21* 17  CREATININE 0.88 0.78  CALCIUM 9.4 9.0   Liver Function Tests: Recent Labs  Lab 05/21/22 1452  AST 18  ALT 19  ALKPHOS 166*  BILITOT 0.5  PROT 6.2*  ALBUMIN 2.7*   No results for input(s): "LIPASE", "AMYLASE" in the last 168 hours. No results for input(s): "AMMONIA" in the last 168 hours. Coagulation Profile: No results for input(s): "INR", "PROTIME" in the last 168 hours. CBC: Recent Labs  Lab 05/21/22 1452 05/22/22 0634  WBC 9.3 11.5*  NEUTROABS 7.9*  --   HGB 11.3* 10.4*  HCT 37.8* 35.2*  MCV 104.7* 105.1*  PLT 271 239   Cardiac Enzymes: No results for input(s): "CKTOTAL", "CKMB", "CKMBINDEX", "TROPONINI" in the last 168 hours. BNP: Invalid input(s): "POCBNP" CBG: No results for input(s): "GLUCAP" in the last 168 hours. HbA1C: No results for input(s): "HGBA1C" in the last 72 hours. Urine analysis:    Component Value Date/Time   COLORURINE YELLOW 04/29/2022  7510   APPEARANCEUR HAZY (A) 04/29/2022 0803   LABSPEC 1.017 04/29/2022 0803   PHURINE 5.0 04/29/2022 0803   GLUCOSEU NEGATIVE 04/29/2022 0803   HGBUR NEGATIVE 04/29/2022 0803   BILIRUBINUR NEGATIVE 04/29/2022 0803   KETONESUR 20 (A) 04/29/2022 0803   PROTEINUR NEGATIVE 04/29/2022 0803   UROBILINOGEN 1.0 06/12/2015 1021   NITRITE NEGATIVE 04/29/2022 0803   LEUKOCYTESUR NEGATIVE 04/29/2022 0803   Sepsis Labs: @LABRCNTIP (procalcitonin:4,lacticidven:4) ) Recent Results (from the past 240 hour(s))  Resp Panel by RT-PCR (Flu A&B, Covid) Anterior Nasal Swab     Status: None   Collection Time: 05/21/22  4:00 PM   Specimen: Anterior Nasal Swab  Result Value Ref Range Status   SARS Coronavirus 2 by RT PCR NEGATIVE NEGATIVE Final    Comment: (NOTE) SARS-CoV-2 target nucleic acids are NOT DETECTED.  The SARS-CoV-2 RNA is generally  detectable in upper respiratory specimens during the acute phase of infection. The lowest concentration of SARS-CoV-2 viral copies this assay can detect is 138 copies/mL. A negative result does not preclude SARS-Cov-2 infection and should not be used as the sole basis for treatment or other patient management decisions. A negative result may occur with  improper specimen collection/handling, submission of specimen other than nasopharyngeal swab, presence of viral mutation(s) within the areas targeted by this assay, and inadequate number of viral copies(<138 copies/mL). A negative result must be combined with clinical observations, patient history, and epidemiological information. The expected result is Negative.  Fact Sheet for Patients:  EntrepreneurPulse.com.au  Fact Sheet for Healthcare Providers:  IncredibleEmployment.be  This test is no t yet approved or cleared by the [place] FDA and  has been authorized for detection and/or diagnosis of SARS-CoV-2 by FDA under an Emergency Use Authorization (EUA). This EUA will remain  in effect (meaning this test can be used) for the duration of the COVID-19 declaration under Section 564(b)(1) of the Act, 21 U.S.C.section 360bbb-3(b)(1), unless the authorization is terminated  or revoked sooner.       Influenza A by PCR NEGATIVE NEGATIVE Final   Influenza B by PCR NEGATIVE NEGATIVE Final    Comment: (NOTE) The Xpert Xpress SARS-CoV-2/FLU/RSV plus assay is intended as an aid in the diagnosis of influenza from Nasopharyngeal swab specimens and should not be used as a sole basis for treatment. Nasal washings and aspirates are unacceptable for Xpert Xpress SARS-CoV-2/FLU/RSV testing.  Fact Sheet for Patients: EntrepreneurPulse.com.au  Fact Sheet for Healthcare Providers: IncredibleEmployment.be  This test is not yet approved or cleared by the [place] FDA  and has been authorized for detection and/or diagnosis of SARS-CoV-2 by FDA under an Emergency Use Authorization (EUA). This EUA will remain in effect (meaning this test can be used) for the duration of the COVID-19 declaration under Section 564(b)(1) of the Act, 21 U.S.C. section 360bbb-3(b)(1), unless the authorization is terminated or revoked.  Performed at [facility], [address] [place], [place] [address]   MRSA Next Gen by PCR, Nasal     Status: None   Collection Time: 05/21/22  8:06 PM   Specimen: Nasal Mucosa; Nasal Swab  Result Value Ref Range Status   MRSA by PCR Next Gen NOT DETECTED NOT DETECTED Final    Comment: (NOTE) The GeneXpert MRSA Assay (FDA approved for NASAL specimens only), is one component of a comprehensive MRSA colonization surveillance program. It is not intended to diagnose MRSA infection nor to guide or monitor treatment for MRSA infections. Test performance is not FDA approved in patients less  than 65 years old. Performed at [facility], [address] [place], [place] [address]      Scheduled Meds:  apixaban  5 mg Oral BID   arformoterol  15 mcg Nebulization BID   atorvastatin  40 mg Oral Daily   budesonide (PULMICORT) nebulizer solution  0.5 mg Nebulization BID   Chlorhexidine Gluconate Cloth  6 each Topical Daily   collagenase   Topical Daily   gabapentin  100 mg Oral TID   guaiFENesin  1,200 mg Oral BID   ipratropium-albuterol  3 mL Nebulization Q6H   methylPREDNISolone (SOLU-MEDROL) injection  60 mg Intravenous Q12H   pantoprazole  40 mg Oral BID   revefenacin  175 mcg Nebulization Daily   silver sulfADIAZINE   Topical Daily   Continuous Infusions:  levofloxacin (LEVAQUIN) IV Stopped (05/23/22 0116)   vancomycin Stopped (05/23/22 1734)    Procedures/Studies: MR THORACIC SPINE WO CONTRAST  Result Date: 05/21/2022 CLINICAL DATA:  Initial evaluation for soft tissue ulceration to back. EXAM: MRI THORACIC AND LUMBAR SPINE WITHOUT  CONTRAST TECHNIQUE: Multiplanar and multiecho pulse sequences of the thoracic and lumbar spine were obtained without intravenous contrast. COMPARISON:  Prior MRI from 08/26/2019. FINDINGS: MRI THORACIC SPINE FINDINGS Alignment: Physiologic with preservation of the normal thoracic kyphosis. No significant listhesis. Vertebrae: Susceptibility artifact related to prior ACDF noted at C5-6 on counter sequence. Underlying bone marrow signal intensity within normal limits. No worrisome osseous lesions. Multiple acute to subacute compression fractures are seen as follows; 1. T5 compression fracture with up to 50% height loss and trace 2 mm bony retropulsion. 2. T6 compression fracture with up to 35% height loss and trace 2 mm bony retropulsion. 3. T7 compression fracture with 35% height loss without bony retropulsion. 4. T8 compression fracture with up to 50% height loss and 3 mm bony retropulsion. 5. T9 compression fracture with 20% height loss without significant bony retropulsion. 6. T11 compression fracture with mild 35% central height loss without significant bony retropulsion. 7. T12 compression fracture with mild 15% height loss without significant bony retropulsion. Marrow edema involving the left transverse process of T6 likely reflects an acute nondisplaced fracture (a series 18, image 15). Additional chronic T10 compression fracture with mild 30% central height loss without retropulsion noted. No evidence for osteomyelitis discitis within the thoracic spine. Cord:  Normal signal and morphology. Paraspinal and other soft tissues: Mild diffuse edema noted within the subcutaneous fat of the posterior thoracolumbar spine. No loculated collections. Consolidative opacity at the visualized posterior left lung base, which could reflect atelectasis or infiltrate. Disc levels: No significant disc pathology seen within the thoracic spine for age. Trace bony retropulsion about several of the compression fractures as above.  No significant spinal stenosis. Foramina remain largely patent. MRI LUMBAR SPINE FINDINGS Segmentation: Transitional features seen about the lumbosacral junction with a partially lumbarized S1 segment. Alignment: Physiologic with preservation of the normal lumbar lordosis. No significant listhesis. Vertebrae: Bone marrow signal intensity within normal limits. No discrete or worrisome osseous lesions. Several acute to early subacute compression fractures are seen as follows; 1. Compression fracture involving the superior endplate of L2 with 67% central height loss with trace 2 mm bony retropulsion. 2. Compression fracture involving the L3 vertebral body with mild 10% height loss without significant bony retropulsion. 3. Acute to subacute on chronic compression fracture involving the L4 vertebral body with complete central height loss with trace 2 mm bony retropulsion. 4. Compression fracture involving the superior endplate of L5 with mild 20% height  loss without bony retropulsion. 5. Compression fracture involving the superior endplate of S1 without significant height loss or bony retropulsion. Conus medullaris and cauda equina: Conus extends to the L1 level. Conus and cauda equina appear normal. Paraspinal and other soft tissues: Soft tissue defect overlying the spinous processes of L1 through L3, compatible with ulceration (series 2, image 6). Diffuse edema within the underlying subcutaneous fat and paraspinous musculature. No discrete or loculated collections. No convincing evidence for osteomyelitis discitis or septic arthritis. Visualized visceral structures within normal limits. Disc levels: L1-2: Negative interspace. Mild facet hypertrophy. No significant stenosis. L2-3: Mild disc bulge with trace bony retropulsion. Mild facet hypertrophy. No significant spinal stenosis. Foramina remain patent. L3-4: Disc bulge with disc desiccation. Superimposed small left foraminal disc protrusion closely approximates the  exiting left L3 nerve root (series 5, image 16). Mild facet hypertrophy. No significant spinal stenosis. Foramina remain patent. L4-5: Mild disc bulge with disc desiccation and trace bony retropulsion. Mild facet hypertrophy. No significant spinal stenosis. Moderate right greater than left L4 foraminal narrowing. L5-S1: Mild diffuse disc bulge. Moderate facet hypertrophy with associated small joint effusions. No significant spinal stenosis. Moderate right with mild-to-moderate left L5 foraminal narrowing. S1-2: Rudimentary S1-2 interspace. No disc bulge or focal disc herniation. No stenosis. IMPRESSION: 1. Multiple acute to subacute compression fractures involving the T5, T6, T7, T8, T9, T11, T12, L2, L3, L4, L5, and S1 as detailed above. No more than trace bony retropulsion at several levels without significant spinal stenosis. 2. Soft tissue ulceration overlying the lower back at the level of the spinous processes of L1 through L3. Diffuse edema throughout the underlying posterior paraspinous soft tissues without loculated or drainable fluid collection. No convincing evidence for osteomyelitis discitis or septic arthritis within the thoracolumbar spine itself. 3. Mild multilevel lumbar spondylosis without significant spinal stenosis. Moderate bilateral L4 and L5 foraminal narrowing. 4. Patchy consolidative opacity within the posterior left lung base, either atelectasis or infiltrate. Electronically Signed   By: Jeannine Boga M.D.   On: 05/21/2022 20:06   MR LUMBAR SPINE WO CONTRAST  Result Date: 05/21/2022 CLINICAL DATA:  Initial evaluation for soft tissue ulceration to back. EXAM: MRI THORACIC AND LUMBAR SPINE WITHOUT CONTRAST TECHNIQUE: Multiplanar and multiecho pulse sequences of the thoracic and lumbar spine were obtained without intravenous contrast. COMPARISON:  Prior MRI from 08/26/2019. FINDINGS: MRI THORACIC SPINE FINDINGS Alignment: Physiologic with preservation of the normal thoracic  kyphosis. No significant listhesis. Vertebrae: Susceptibility artifact related to prior ACDF noted at C5-6 on counter sequence. Underlying bone marrow signal intensity within normal limits. No worrisome osseous lesions. Multiple acute to subacute compression fractures are seen as follows; 1. T5 compression fracture with up to 50% height loss and trace 2 mm bony retropulsion. 2. T6 compression fracture with up to 35% height loss and trace 2 mm bony retropulsion. 3. T7 compression fracture with 35% height loss without bony retropulsion. 4. T8 compression fracture with up to 50% height loss and 3 mm bony retropulsion. 5. T9 compression fracture with 20% height loss without significant bony retropulsion. 6. T11 compression fracture with mild 35% central height loss without significant bony retropulsion. 7. T12 compression fracture with mild 15% height loss without significant bony retropulsion. Marrow edema involving the left transverse process of T6 likely reflects an acute nondisplaced fracture (a series 18, image 15). Additional chronic T10 compression fracture with mild 30% central height loss without retropulsion noted. No evidence for osteomyelitis discitis within the thoracic spine. Cord:  Normal signal and  morphology. Paraspinal and other soft tissues: Mild diffuse edema noted within the subcutaneous fat of the posterior thoracolumbar spine. No loculated collections. Consolidative opacity at the visualized posterior left lung base, which could reflect atelectasis or infiltrate. Disc levels: No significant disc pathology seen within the thoracic spine for age. Trace bony retropulsion about several of the compression fractures as above. No significant spinal stenosis. Foramina remain largely patent. MRI LUMBAR SPINE FINDINGS Segmentation: Transitional features seen about the lumbosacral junction with a partially lumbarized S1 segment. Alignment: Physiologic with preservation of the normal lumbar lordosis. No  significant listhesis. Vertebrae: Bone marrow signal intensity within normal limits. No discrete or worrisome osseous lesions. Several acute to early subacute compression fractures are seen as follows; 1. Compression fracture involving the superior endplate of L2 with 16% central height loss with trace 2 mm bony retropulsion. 2. Compression fracture involving the L3 vertebral body with mild 10% height loss without significant bony retropulsion. 3. Acute to subacute on chronic compression fracture involving the L4 vertebral body with complete central height loss with trace 2 mm bony retropulsion. 4. Compression fracture involving the superior endplate of L5 with mild 20% height loss without bony retropulsion. 5. Compression fracture involving the superior endplate of S1 without significant height loss or bony retropulsion. Conus medullaris and cauda equina: Conus extends to the L1 level. Conus and cauda equina appear normal. Paraspinal and other soft tissues: Soft tissue defect overlying the spinous processes of L1 through L3, compatible with ulceration (series 2, image 6). Diffuse edema within the underlying subcutaneous fat and paraspinous musculature. No discrete or loculated collections. No convincing evidence for osteomyelitis discitis or septic arthritis. Visualized visceral structures within normal limits. Disc levels: L1-2: Negative interspace. Mild facet hypertrophy. No significant stenosis. L2-3: Mild disc bulge with trace bony retropulsion. Mild facet hypertrophy. No significant spinal stenosis. Foramina remain patent. L3-4: Disc bulge with disc desiccation. Superimposed small left foraminal disc protrusion closely approximates the exiting left L3 nerve root (series 5, image 16). Mild facet hypertrophy. No significant spinal stenosis. Foramina remain patent. L4-5: Mild disc bulge with disc desiccation and trace bony retropulsion. Mild facet hypertrophy. No significant spinal stenosis. Moderate right  greater than left L4 foraminal narrowing. L5-S1: Mild diffuse disc bulge. Moderate facet hypertrophy with associated small joint effusions. No significant spinal stenosis. Moderate right with mild-to-moderate left L5 foraminal narrowing. S1-2: Rudimentary S1-2 interspace. No disc bulge or focal disc herniation. No stenosis. IMPRESSION: 1. Multiple acute to subacute compression fractures involving the T5, T6, T7, T8, T9, T11, T12, L2, L3, L4, L5, and S1 as detailed above. No more than trace bony retropulsion at several levels without significant spinal stenosis. 2. Soft tissue ulceration overlying the lower back at the level of the spinous processes of L1 through L3. Diffuse edema throughout the underlying posterior paraspinous soft tissues without loculated or drainable fluid collection. No convincing evidence for osteomyelitis discitis or septic arthritis within the thoracolumbar spine itself. 3. Mild multilevel lumbar spondylosis without significant spinal stenosis. Moderate bilateral L4 and L5 foraminal narrowing. 4. Patchy consolidative opacity within the posterior left lung base, either atelectasis or infiltrate. Electronically Signed   By: Jeannine Boga M.D.   On: 05/21/2022 20:06   DG Chest Port 1 View  Result Date: 05/21/2022 CLINICAL DATA:  Shortness of breath.  Smoker. EXAM: PORTABLE CHEST 1 VIEW COMPARISON:  05/15/2022 FINDINGS: Normal sized heart. Clear lungs. The lungs remain mildly hyperexpanded. Previously described callus associated with the right posterior 6th rib, compatible with an old,  healed fracture. IMPRESSION: No acute abnormality.  Stable mild changes of COPD. Electronically Signed   By: Claudie Revering M.D.   On: 05/21/2022 15:44   CT Angio Chest PE W and/or Wo Contrast  Result Date: 05/15/2022 CLINICAL DATA:  Hemoptysis since yesterday morning, non-small cell lung cancer EXAM: CT ANGIOGRAPHY CHEST WITH CONTRAST TECHNIQUE: Multidetector CT imaging of the chest was performed  using the standard protocol during bolus administration of intravenous contrast. Multiplanar CT image reconstructions and MIPs were obtained to evaluate the vascular anatomy. RADIATION DOSE REDUCTION: This exam was performed according to the departmental dose-optimization program which includes automated exposure control, adjustment of the mA and/or kV according to patient size and/or use of iterative reconstruction technique. CONTRAST:  26mL OMNIPAQUE IOHEXOL 350 MG/ML SOLN COMPARISON:  05/07/2022 FINDINGS: Cardiovascular: This is a technically adequate evaluation of the pulmonary vasculature. There are no filling defects or pulmonary emboli. The heart is unremarkable without pericardial effusion. No evidence of thoracic aortic aneurysm or dissection. Stable atherosclerosis of the aorta and coronary vasculature. Mediastinum/Nodes: No enlarged mediastinal, hilar, or axillary lymph nodes. Thyroid gland, trachea, and esophagus demonstrate no significant findings. Lungs/Pleura: No significant change in the large endobronchial lesion occupying the majority of the left upper lobe bronchial tree. Severe upper lobe predominant emphysema is again noted. No acute airspace disease. Trace left pleural effusion. No pneumothorax. Upper Abdomen: Stable nodular right adrenal thickening measuring 2 x 1 cm, mean attenuation -3 HU, most consistent with adenoma. No acute upper abdominal findings. Musculoskeletal: No acute or destructive bony lesions. Multiple prior healed bilateral rib fractures. Chronic compression deformities at T5, T7, T8, T11, and T12. Reconstructed images demonstrate no additional findings. Review of the MIP images confirms the above findings. IMPRESSION: 1. No evidence of pulmonary embolus. 2. Stable left upper lobe endobronchial mass consistent with known lung cancer. 3. Trace left pleural effusion. 4. Aortic Atherosclerosis (ICD10-I70.0) and Emphysema (ICD10-J43.9). 5. Stable right adrenal nodule, previously  characterized as adenoma. Electronically Signed   By: Randa Ngo M.D.   On: 05/15/2022 18:04   CT Head Wo Contrast  Result Date: 05/15/2022 CLINICAL DATA:  Mental status change. EXAM: CT HEAD WITHOUT CONTRAST TECHNIQUE: Contiguous axial images were obtained from the base of the skull through the vertex without intravenous contrast. RADIATION DOSE REDUCTION: This exam was performed according to the departmental dose-optimization program which includes automated exposure control, adjustment of the mA and/or kV according to patient size and/or use of iterative reconstruction technique. COMPARISON:  None Available. FINDINGS: Brain: No evidence of acute infarction, hemorrhage, hydrocephalus, extra-axial collection or mass lesion/mass effect. Vascular: No hyperdense vessel or unexpected calcification. Skull: Normal. Negative for fracture or focal lesion. Sinuses/Orbits: Minimal polypoid thickening in the maxillary sinuses Other: None. IMPRESSION: No acute intracranial abnormality. Electronically Signed   By: Fidela Salisbury M.D.   On: 05/15/2022 18:02   DG Chest Portable 1 View  Result Date: 05/15/2022 CLINICAL DATA:  Hemoptysis.  Pain. EXAM: PORTABLE CHEST 1 VIEW COMPARISON:  05/07/2022 CT and 05/06/2022 chest x-ray FINDINGS: Heart size is normal. There is perihilar peribronchial thickening. No new consolidations or pleural effusions. No evidence for pulmonary edema. Remote RIGHT posterior 6th rib fracture. IMPRESSION: No evidence for acute abnormality. Chronic bronchitic changes. Electronically Signed   By: Nolon Nations M.D.   On: 05/15/2022 13:48   CT CHEST ABDOMEN PELVIS W CONTRAST  Result Date: 05/08/2022 CLINICAL DATA:  Non-small cell lung cancer, acute respiratory failure with hypoxia, failure to thrive, nausea/vomiting EXAM: CT CHEST, ABDOMEN, AND  PELVIS WITH CONTRAST TECHNIQUE: Multidetector CT imaging of the chest, abdomen and pelvis was performed following the standard protocol during  bolus administration of intravenous contrast. RADIATION DOSE REDUCTION: This exam was performed according to the departmental dose-optimization program which includes automated exposure control, adjustment of the mA and/or kV according to patient size and/or use of iterative reconstruction technique. CONTRAST:  123mL OMNIPAQUE IOHEXOL 300 MG/ML  SOLN COMPARISON:  CT chest dated 03/18/2022. CTA chest abdomen pelvis dated 10/17/2021. FINDINGS: CT CHEST FINDINGS Cardiovascular: No evidence of thoracic aortic aneurysm. Mild atherosclerotic calcifications of the aortic arch. The heart is normal in size.  No pericardial effusion. Coronary atherosclerosis of the LAD and left circumflex. Mediastinum/Nodes: No suspicious mediastinal lymphadenopathy. Visualized thyroid is unremarkable. Lungs/Pleura: Endobronchial soft tissue in the proximal left upper lobe bronchus (series 2/image 34) and extending into the central left upper lobe (series 2/image 28), difficult to discretely measure but measuring approximately 4.9 cm in maximal craniocaudal dimension (coronal image 80) new. This corresponds to the patient's known primary bronchogenic neoplasm and is favored to be mildly progressive. Moderate centrilobular and paraseptal emphysematous changes, upper lung predominant. No focal consolidation. No pleural effusion or pneumothorax. Musculoskeletal: Moderate compression fracture deformity at T5 and T8, grossly unchanged. Mild superior endplate changes at H82. Cervical spine fixation hardware, incompletely visualized. CT ABDOMEN PELVIS FINDINGS Hepatobiliary: Liver is within normal limits. No suspicious/enhancing hepatic lesions. Gallbladder is underdistended but unremarkable. No intrahepatic or extrahepatic duct dilatation. Pancreas: Within normal limits. Spleen: Within normal limits. Adrenals/Urinary Tract: Stable 2.1 cm right adrenal nodule (series 2/image 68), unchanged from remote priors, favoring a benign adrenal adenoma. Left  adrenal gland is within normal limits. Kidneys are notable for probable early excretory contrast in the collecting systems. No hydronephrosis. Excretory contrast in the bladder. Stomach/Bowel: Mildly distended stomach. No evidence of bowel obstruction. Appendix is not discretely visualized. No colonic wall thickening or inflammatory changes. Vascular/Lymphatic: No evidence of abdominal aortic aneurysm. Atherosclerotic calcifications of the abdominal aorta and branch vessels. No suspicious abdominopelvic lymphadenopathy. Reproductive: Prostate is unremarkable. Other: No abdominopelvic ascites. Musculoskeletal: Moderate compression fracture deformity at L4, unchanged. Mild superior endplate compression fracture deformity at L2 and L5, new. Mild superior endplate changes at L1, unchanged. IMPRESSION: Stable central left upper lobe mass, corresponding to the patient's known primary bronchogenic neoplasm. No findings suspicious for metastatic disease. Stable benign right adrenal adenoma. Multiple thoracolumbar compression fracture deformities, with new mild compression fracture deformities at L2 and L5. Aortic Atherosclerosis (ICD10-I70.0) and Emphysema (ICD10-J43.9). Electronically Signed   By: Julian Hy M.D.   On: 05/08/2022 01:08   MR Brain W Wo Contrast  Result Date: 05/07/2022 EXAM: MRI HEAD WITHOUT AND WITH CONTRAST MRA HEAD WITHOUT CONTRAST TECHNIQUE: Multiplanar, multi-echo pulse sequences of the brain and surrounding structures were acquired without and with intravenous contrast. Angiographic images of the Circle of Willis were acquired using MRA technique without intravenous contrast. CONTRAST:  83mL GADAVIST GADOBUTROL 1 MMOL/ML IV SOLN COMPARISON:  Prior CT from 10/27/2021. FINDINGS: MRI HEAD FINDINGS Brain: Examination moderately degraded by motion artifact. Cerebral volume within normal limits. Patchy T2/FLAIR hyperintensity involving the periventricular deep white matter both cerebral  hemispheres as well as the pons, most consistent with chronic small vessel ischemic disease, mild in nature. No evidence for acute or subacute infarct. Gray-white matter differentiation maintained. No areas of chronic cortical infarction. No acute or chronic intracranial blood products. No mass lesion, midline shift or mass effect no hydrocephalus or extra-axial fluid collection. Pituitary gland suprasellar region normal. No abnormal  enhancement. Vascular: Major intracranial vascular flow voids are maintained. Skull and upper cervical spine: Craniocervical junction within normal limits. Bone marrow signal intensity normal. No focal marrow replacing lesion. No scalp soft tissue abnormality. Sinuses/Orbits: Prior bilateral ocular lens replacement. Mild scattered mucosal thickening with a few small retention cysts noted about the ethmoidal air cells and maxillary sinuses. No significant mastoid effusion. Other: None. MRA HEAD FINDINGS Anterior circulation: Examination moderately degraded by motion artifact. Both internal carotid arteries patent to the termini without significant stenosis or other appreciable abnormality. A1 segments patent bilaterally. Grossly normal anterior communicating artery complex. Both ACAs grossly patent to their distal aspects without appreciable stenosis. No visible M1 stenosis or occlusion. No proximal MCA branch occlusion. Distal MCA branches grossly perfused and symmetric. Posterior circulation: Visualized distal V4 segments patent without stenosis. Right vertebral artery dominant. Neither PICA well visualized. Basilar patent to its distal aspect without stenosis. Superior cerebellar and posterior cerebral arteries patent bilaterally with no appreciable significant stenosis. Anatomic variants: None significant. IMPRESSION: MRI HEAD IMPRESSION: 1. Motion artifact degraded exam. 2. No acute intracranial abnormality. No evidence for intracranial metastatic disease. 3. Mild chronic  microvascular ischemic disease. MRA HEAD IMPRESSION: 1. Motion degraded exam. 2. Grossly negative intracranial MRA. No large vessel occlusion, hemodynamically significant stenosis, or other acute vascular abnormality. Electronically Signed   By: Jeannine Boga M.D.   On: 05/07/2022 20:36   MR ANGIO HEAD WO CONTRAST  Result Date: 05/07/2022 EXAM: MRI HEAD WITHOUT AND WITH CONTRAST MRA HEAD WITHOUT CONTRAST TECHNIQUE: Multiplanar, multi-echo pulse sequences of the brain and surrounding structures were acquired without and with intravenous contrast. Angiographic images of the Circle of Willis were acquired using MRA technique without intravenous contrast. CONTRAST:  47mL GADAVIST GADOBUTROL 1 MMOL/ML IV SOLN COMPARISON:  Prior CT from 10/27/2021. FINDINGS: MRI HEAD FINDINGS Brain: Examination moderately degraded by motion artifact. Cerebral volume within normal limits. Patchy T2/FLAIR hyperintensity involving the periventricular deep white matter both cerebral hemispheres as well as the pons, most consistent with chronic small vessel ischemic disease, mild in nature. No evidence for acute or subacute infarct. Gray-white matter differentiation maintained. No areas of chronic cortical infarction. No acute or chronic intracranial blood products. No mass lesion, midline shift or mass effect no hydrocephalus or extra-axial fluid collection. Pituitary gland suprasellar region normal. No abnormal enhancement. Vascular: Major intracranial vascular flow voids are maintained. Skull and upper cervical spine: Craniocervical junction within normal limits. Bone marrow signal intensity normal. No focal marrow replacing lesion. No scalp soft tissue abnormality. Sinuses/Orbits: Prior bilateral ocular lens replacement. Mild scattered mucosal thickening with a few small retention cysts noted about the ethmoidal air cells and maxillary sinuses. No significant mastoid effusion. Other: None. MRA HEAD FINDINGS Anterior circulation:  Examination moderately degraded by motion artifact. Both internal carotid arteries patent to the termini without significant stenosis or other appreciable abnormality. A1 segments patent bilaterally. Grossly normal anterior communicating artery complex. Both ACAs grossly patent to their distal aspects without appreciable stenosis. No visible M1 stenosis or occlusion. No proximal MCA branch occlusion. Distal MCA branches grossly perfused and symmetric. Posterior circulation: Visualized distal V4 segments patent without stenosis. Right vertebral artery dominant. Neither PICA well visualized. Basilar patent to its distal aspect without stenosis. Superior cerebellar and posterior cerebral arteries patent bilaterally with no appreciable significant stenosis. Anatomic variants: None significant. IMPRESSION: MRI HEAD IMPRESSION: 1. Motion artifact degraded exam. 2. No acute intracranial abnormality. No evidence for intracranial metastatic disease. 3. Mild chronic microvascular ischemic disease. MRA HEAD IMPRESSION: 1. Motion  degraded exam. 2. Grossly negative intracranial MRA. No large vessel occlusion, hemodynamically significant stenosis, or other acute vascular abnormality. Electronically Signed   By: Jeannine Boga M.D.   On: 05/07/2022 20:36   DG Chest Port 1 View  Result Date: 05/06/2022 CLINICAL DATA:  Shortness of breath EXAM: PORTABLE CHEST 1 VIEW COMPARISON:  04/29/2022 radiograph and 03/18/2022 chest CT FINDINGS: Chronic hyperinflation and interstitial coarsening. Biapical lucency. Vague rounded density over the right chest correlates with rib fracture callus by CT 2 months ago. Stable appearance of left supra hilum where there is endobronchial mass and airway obstruction by comparison CT. Normal heart size and mediastinal contours. IMPRESSION: COPD without acute superimposed finding. Electronically Signed   By: Jorje Guild M.D.   On: 05/06/2022 04:49   DG Chest Port 1 View  Result Date:  04/29/2022 CLINICAL DATA:  Shortness of breath and hypoxemia. EXAM: PORTABLE CHEST 1 VIEW COMPARISON:  Portable chest 04/22/2022. FINDINGS: The lungs are emphysematous and clear of acute infiltrates. There is scattered linear scarring. Heart size and vasculature are normal with stable mediastinal silhouette. Mild aortic atherosclerosis. Osteopenia. IMPRESSION: Stable COPD chest with no acute cardiopulmonary findings. Electronically Signed   By: Telford Nab M.D.   On: 04/29/2022 03:14    Orson Eva, DO  [facility]  If 7PM-7AM, please contact night-coverage [email or web address] Password [facility] 05/23/2022, 6:51 PM   LOS: 2 days

## 2022-05-23 NOTE — Progress Notes (Signed)
Rt has attempted twice to give patient his breathing treatment and he has refused. Patient stated his breathing is bad but was able to eat two ice pops. Rt will continue to be available to assist patient HHN needs.

## 2022-05-23 NOTE — Consult Note (Signed)
[facility] Consultation Oncology  Name: Tony Sullivan      MRN: [id number]    Location: A321/A321-01  Date: 05/23/2022 Time:6:10 PM   REFERRING PHYSICIAN: Dr. 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  Bronchoscopy on 03/22/2022 showed endobronchial mass blocking the takeoff of the LUL bronchus, lingular bronchus was open.  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  He also had a diagnosis of pulmonary embolus on 03/13/2022.  He had numerous hospital admissions for COPD/chronic respiratory failure exacerbations.  PAST MEDICAL HISTORY:   Past Medical History:  Diagnosis Date   Anxiety    Arthritis    COPD (chronic obstructive pulmonary disease) ([place]) 2004   diagnosed in 2004, no PFT's to date.  Started on home O2 12/2013, after found to be desatting at PCP's office, and referred to pulmonology.   Depression    GERD (gastroesophageal reflux disease)    High blood pressure    Prediabetes    Seasonal allergies    Substance abuse (HCC)    Tobacco dependence     ALLERGIES: Allergies  Allergen Reactions   Penicillins Anaphylaxis    Has patient had a PCN reaction causing immediate rash, facial/tongue/throat swelling, SOB or lightheadedness with hypotension: Yes Has patient had a PCN reaction causing severe rash involving mucus membranes or skin necrosis: No Has patient had a PCN reaction that required hospitalization No Has patient had a PCN reaction occurring within the last 10 years: No If all of the above answers are "NO", then may proceed with Cephalosporin use.    Levocetirizine Other (See Comments)    Muscle cramps      MEDICATIONS: I have reviewed the patient's  current medications.     PAST SURGICAL HISTORY Past Surgical History:  Procedure Laterality Date   ANTERIOR CERVICAL DECOMP/DISCECTOMY FUSION N/A 09/06/2019   Procedure: Anterior Cervical Discecectomy Fusion - Cerivcal Five-Cervical Six;  Surgeon: Kary Kos, MD;  Location: [facility];  Service: Neurosurgery;  Laterality: N/A;  Anterior Cervical Discecectomy Fusion - Cerivcal Five-Cervical Six   ANTERIOR CERVICAL DISCECTOMY  09/06/2019   APPENDECTOMY  1980   COLONOSCOPY WITH PROPOFOL N/A 12/10/2016   Procedure: COLONOSCOPY WITH PROPOFOL;  Surgeon: [name], MD;  Location: [facility];  Service: Endoscopy;  Laterality: N/A;   FLEXIBLE BRONCHOSCOPY Left 03/22/2022   Procedure: FLEXIBLE BRONCHOSCOPY;  Surgeon: Rigoberto Noel, MD;  [facility];  Service: Pulmonary;  Laterality: Left;   HIP SURGERY Right    SHOULDER ARTHROSCOPY Right     FAMILY HISTORY: Family History  Problem Relation Age of Onset   Emphysema Mother    Allergies Mother        "everyone in family"   Asthma Mother        "everyone in family"   Heart disease Mother    Clotting disorder Mother    Cancer Mother    Emphysema Father    Allergies Father    Allergies Son    Seizures Brother    Diabetes Brother     SOCIAL HISTORY:  reports that he has been smoking cigarettes. He has a 9.00 pack-year smoking history. He has never used smokeless tobacco. He reports that he does not currently use alcohol after  a past usage of about 14.0 - 21.0 standard drinks of alcohol per week. He reports current drug use. Drugs: Marijuana and Cocaine.  PERFORMANCE STATUS: The patient's performance status is 3 - Symptomatic, >50% confined to bed  PHYSICAL EXAM: Most Recent Vital Signs: Blood pressure (!) 152/95, pulse (!) 103, temperature 97.9 F (36.6 C), temperature source Oral, resp. rate 20, height _0  (1.803 m), weight 129 lb 10.1 oz (58.8 kg), SpO2 100 %. BP (!) 152/95 (BP Location: Left Arm)   Pulse (!) 103   Temp  97.9 F (36.6 C) (Oral)   Resp 20   Ht _1  (1.803 m)   Wt 129 lb 10.1 oz (58.8 kg)   SpO2 100%   BMI 18.08 kg/m  General appearance: alert, cooperative, and appears stated age Neurologic: Grossly normal  LABORATORY DATA:  Results for orders placed or performed during the hospital encounter of 05/21/22 (from the past 48 hour(s))  MRSA Next Gen by PCR, Nasal     Status: None   Collection Time: 05/21/22  8:06 PM   Specimen: Nasal Mucosa; Nasal Swab  Result Value Ref Range   MRSA by PCR Next Gen NOT DETECTED NOT DETECTED    Comment: (NOTE) The GeneXpert MRSA Assay (FDA approved for NASAL specimens only), is one component of a comprehensive MRSA colonization surveillance program. It is not intended to diagnose MRSA infection nor to guide or monitor treatment for MRSA infections. Test performance is not FDA approved in patients less than 74 years old. Performed at [facility], [address], [place], [place] [address]   Basic metabolic panel     Status: Abnormal   Collection Time: 05/22/22  6:34 AM  Result Value Ref Range   Sodium 144 135 - 145 mmol/L   Potassium 4.1 3.5 - 5.1 mmol/L   Chloride 102 98 - 111 mmol/L   CO2 34 (H) 22 - 32 mmol/L   Glucose, Bld 115 (H) 70 - 99 mg/dL    Comment: Glucose reference range applies only to samples taken after fasting for at least 8 hours.   BUN 17 6 - 20 mg/dL   Creatinine, Ser 0.78 0.61 - 1.24 mg/dL   Calcium 9.0 8.9 - 10.3 mg/dL   GFR, Estimated >60 >60 mL/min    Comment: (NOTE) Calculated using the CKD-EPI Creatinine Equation (2021)    Anion gap 8 5 - 15    Comment: Performed at [facility], [address], [place], [place] [address]  CBC     Status: Abnormal   Collection Time: 05/22/22  6:34 AM  Result Value Ref Range   WBC 11.5 (H) 4.0 - 10.5 K/uL   RBC 3.35 (L) 4.22 - 5.81 MIL/uL   Hemoglobin 10.4 (L) 13.0 - 17.0 g/dL   HCT 35.2 (L) 39.0 - 52.0 %   MCV 105.1 (H) 80.0 - 100.0 fL   MCH 31.0 26.0 - 34.0 pg   MCHC 29.5 (L)  30.0 - 36.0 g/dL   RDW 16.4 (H) 11.5 - 15.5 %   Platelets 239 150 - 400 K/uL   nRBC 0.0 0.0 - 0.2 %    Comment: Performed at [facility], [address], [place], [place] [address]  Blood gas, arterial     Status: Abnormal   Collection Time: 05/22/22  8:51 AM  Result Value Ref Range   FIO2 40.00 %   pH, Arterial 7.42 7.35 - 7.45   pCO2 arterial 57 (H) 32 - 48 mmHg   pO2, Arterial 70 (  L) 83 - 108 mmHg   Bicarbonate 37.4 (H) 20.0 - 28.0 mmol/L   Acid-Base Excess 10.4 (H) 0.0 - 2.0 mmol/L   O2 Saturation 97.1 %   Patient temperature 36.3    Collection site RIGHT RADIAL    Drawn by [id number]    Allens test (pass/fail) PASS PASS    Comment: Performed at [facility], [address], [place], [place] [address]      RADIOGRAPHY: MR THORACIC SPINE WO CONTRAST  Result Date: 05/21/2022 CLINICAL DATA:  Initial evaluation for soft tissue ulceration to back. EXAM: MRI THORACIC AND LUMBAR SPINE WITHOUT CONTRAST TECHNIQUE: Multiplanar and multiecho pulse sequences of the thoracic and lumbar spine were obtained without intravenous contrast. COMPARISON:  Prior MRI from 08/26/2019. FINDINGS: MRI THORACIC SPINE FINDINGS Alignment: Physiologic with preservation of the normal thoracic kyphosis. No significant listhesis. Vertebrae: Susceptibility artifact related to prior ACDF noted at C5-6 on counter sequence. Underlying bone marrow signal intensity within normal limits. No worrisome osseous lesions. Multiple acute to subacute compression fractures are seen as follows; 1. T5 compression fracture with up to 50% height loss and trace 2 mm bony retropulsion. 2. T6 compression fracture with up to 35% height loss and trace 2 mm bony retropulsion. 3. T7 compression fracture with 35% height loss without bony retropulsion. 4. T8 compression fracture with up to 50% height loss and 3 mm bony retropulsion. 5. T9 compression fracture with 20% height loss without significant bony retropulsion. 6. T11 compression fracture with  mild 35% central height loss without significant bony retropulsion. 7. T12 compression fracture with mild 15% height loss without significant bony retropulsion. Marrow edema involving the left transverse process of T6 likely reflects an acute nondisplaced fracture (a series 18, image 15). Additional chronic T10 compression fracture with mild 30% central height loss without retropulsion noted. No evidence for osteomyelitis discitis within the thoracic spine. Cord:  Normal signal and morphology. Paraspinal and other soft tissues: Mild diffuse edema noted within the subcutaneous fat of the posterior thoracolumbar spine. No loculated collections. Consolidative opacity at the visualized posterior left lung base, which could reflect atelectasis or infiltrate. Disc levels: No significant disc pathology seen within the thoracic spine for age. Trace bony retropulsion about several of the compression fractures as above. No significant spinal stenosis. Foramina remain largely patent. MRI LUMBAR SPINE FINDINGS Segmentation: Transitional features seen about the lumbosacral junction with a partially lumbarized S1 segment. Alignment: Physiologic with preservation of the normal lumbar lordosis. No significant listhesis. Vertebrae: Bone marrow signal intensity within normal limits. No discrete or worrisome osseous lesions. Several acute to early subacute compression fractures are seen as follows; 1. Compression fracture involving the superior endplate of L2 with 58% central height loss with trace 2 mm bony retropulsion. 2. Compression fracture involving the L3 vertebral body with mild 10% height loss without significant bony retropulsion. 3. Acute to subacute on chronic compression fracture involving the L4 vertebral body with complete central height loss with trace 2 mm bony retropulsion. 4. Compression fracture involving the superior endplate of L5 with mild 20% height loss without bony retropulsion. 5. Compression fracture  involving the superior endplate of S1 without significant height loss or bony retropulsion. Conus medullaris and cauda equina: Conus extends to the L1 level. Conus and cauda equina appear normal. Paraspinal and other soft tissues: Soft tissue defect overlying the spinous processes of L1 through L3, compatible with ulceration (series 2, image 6). Diffuse edema within the underlying subcutaneous fat and paraspinous musculature. No discrete or loculated collections.  No convincing evidence for osteomyelitis discitis or septic arthritis. Visualized visceral structures within normal limits. Disc levels: L1-2: Negative interspace. Mild facet hypertrophy. No significant stenosis. L2-3: Mild disc bulge with trace bony retropulsion. Mild facet hypertrophy. No significant spinal stenosis. Foramina remain patent. L3-4: Disc bulge with disc desiccation. Superimposed small left foraminal disc protrusion closely approximates the exiting left L3 nerve root (series 5, image 16). Mild facet hypertrophy. No significant spinal stenosis. Foramina remain patent. L4-5: Mild disc bulge with disc desiccation and trace bony retropulsion. Mild facet hypertrophy. No significant spinal stenosis. Moderate right greater than left L4 foraminal narrowing. L5-S1: Mild diffuse disc bulge. Moderate facet hypertrophy with associated small joint effusions. No significant spinal stenosis. Moderate right with mild-to-moderate left L5 foraminal narrowing. S1-2: Rudimentary S1-2 interspace. No disc bulge or focal disc herniation. No stenosis. IMPRESSION: 1. Multiple acute to subacute compression fractures involving the T5, T6, T7, T8, T9, T11, T12, L2, L3, L4, L5, and S1 as detailed above. No more than trace bony retropulsion at several levels without significant spinal stenosis. 2. Soft tissue ulceration overlying the lower back at the level of the spinous processes of L1 through L3. Diffuse edema throughout the underlying posterior paraspinous soft  tissues without loculated or drainable fluid collection. No convincing evidence for osteomyelitis discitis or septic arthritis within the thoracolumbar spine itself. 3. Mild multilevel lumbar spondylosis without significant spinal stenosis. Moderate bilateral L4 and L5 foraminal narrowing. 4. Patchy consolidative opacity within the posterior left lung base, either atelectasis or infiltrate. Electronically Signed   By: Jeannine Boga M.D.   On: 05/21/2022 20:06   MR LUMBAR SPINE WO CONTRAST  Result Date: 05/21/2022 CLINICAL DATA:  Initial evaluation for soft tissue ulceration to back. EXAM: MRI THORACIC AND LUMBAR SPINE WITHOUT CONTRAST TECHNIQUE: Multiplanar and multiecho pulse sequences of the thoracic and lumbar spine were obtained without intravenous contrast. COMPARISON:  Prior MRI from 08/26/2019. FINDINGS: MRI THORACIC SPINE FINDINGS Alignment: Physiologic with preservation of the normal thoracic kyphosis. No significant listhesis. Vertebrae: Susceptibility artifact related to prior ACDF noted at C5-6 on counter sequence. Underlying bone marrow signal intensity within normal limits. No worrisome osseous lesions. Multiple acute to subacute compression fractures are seen as follows; 1. T5 compression fracture with up to 50% height loss and trace 2 mm bony retropulsion. 2. T6 compression fracture with up to 35% height loss and trace 2 mm bony retropulsion. 3. T7 compression fracture with 35% height loss without bony retropulsion. 4. T8 compression fracture with up to 50% height loss and 3 mm bony retropulsion. 5. T9 compression fracture with 20% height loss without significant bony retropulsion. 6. T11 compression fracture with mild 35% central height loss without significant bony retropulsion. 7. T12 compression fracture with mild 15% height loss without significant bony retropulsion. Marrow edema involving the left transverse process of T6 likely reflects an acute nondisplaced fracture (a series 18,  image 15). Additional chronic T10 compression fracture with mild 30% central height loss without retropulsion noted. No evidence for osteomyelitis discitis within the thoracic spine. Cord:  Normal signal and morphology. Paraspinal and other soft tissues: Mild diffuse edema noted within the subcutaneous fat of the posterior thoracolumbar spine. No loculated collections. Consolidative opacity at the visualized posterior left lung base, which could reflect atelectasis or infiltrate. Disc levels: No significant disc pathology seen within the thoracic spine for age. Trace bony retropulsion about several of the compression fractures as above. No significant spinal stenosis. Foramina remain largely patent. MRI LUMBAR SPINE FINDINGS Segmentation: Transitional  features seen about the lumbosacral junction with a partially lumbarized S1 segment. Alignment: Physiologic with preservation of the normal lumbar lordosis. No significant listhesis. Vertebrae: Bone marrow signal intensity within normal limits. No discrete or worrisome osseous lesions. Several acute to early subacute compression fractures are seen as follows; 1. Compression fracture involving the superior endplate of L2 with 27% central height loss with trace 2 mm bony retropulsion. 2. Compression fracture involving the L3 vertebral body with mild 10% height loss without significant bony retropulsion. 3. Acute to subacute on chronic compression fracture involving the L4 vertebral body with complete central height loss with trace 2 mm bony retropulsion. 4. Compression fracture involving the superior endplate of L5 with mild 20% height loss without bony retropulsion. 5. Compression fracture involving the superior endplate of S1 without significant height loss or bony retropulsion. Conus medullaris and cauda equina: Conus extends to the L1 level. Conus and cauda equina appear normal. Paraspinal and other soft tissues: Soft tissue defect overlying the spinous processes of  L1 through L3, compatible with ulceration (series 2, image 6). Diffuse edema within the underlying subcutaneous fat and paraspinous musculature. No discrete or loculated collections. No convincing evidence for osteomyelitis discitis or septic arthritis. Visualized visceral structures within normal limits. Disc levels: L1-2: Negative interspace. Mild facet hypertrophy. No significant stenosis. L2-3: Mild disc bulge with trace bony retropulsion. Mild facet hypertrophy. No significant spinal stenosis. Foramina remain patent. L3-4: Disc bulge with disc desiccation. Superimposed small left foraminal disc protrusion closely approximates the exiting left L3 nerve root (series 5, image 16). Mild facet hypertrophy. No significant spinal stenosis. Foramina remain patent. L4-5: Mild disc bulge with disc desiccation and trace bony retropulsion. Mild facet hypertrophy. No significant spinal stenosis. Moderate right greater than left L4 foraminal narrowing. L5-S1: Mild diffuse disc bulge. Moderate facet hypertrophy with associated small joint effusions. No significant spinal stenosis. Moderate right with mild-to-moderate left L5 foraminal narrowing. S1-2: Rudimentary S1-2 interspace. No disc bulge or focal disc herniation. No stenosis. IMPRESSION: 1. Multiple acute to subacute compression fractures involving the T5, T6, T7, T8, T9, T11, T12, L2, L3, L4, L5, and S1 as detailed above. No more than trace bony retropulsion at several levels without significant spinal stenosis. 2. Soft tissue ulceration overlying the lower back at the level of the spinous processes of L1 through L3. Diffuse edema throughout the underlying posterior paraspinous soft tissues without loculated or drainable fluid collection. No convincing evidence for osteomyelitis discitis or septic arthritis within the thoracolumbar spine itself. 3. Mild multilevel lumbar spondylosis without significant spinal stenosis. Moderate bilateral L4 and L5 foraminal narrowing.  4. Patchy consolidative opacity within the posterior left lung base, either atelectasis or infiltrate. Electronically Signed   By: Jeannine Boga M.D.   On: 05/21/2022 20:06        ASSESSMENT and PLAN:  1.  Endobronchial large cell neuroendocrine carcinoma: - Bronchoscopy (03/22/2022): Endobronchial mass blocking takeoff of LUL bronchus. - Pathology consistent with large cell neuroendocrine carcinoma. - Staging CT CAP on 05/08/2022: Endobronchial soft tissue in the proximal left upper lobe bronchus extending into the central left upper lobe.  No suspicious adenopathy.  No other metastatic disease. - Brain MRI was negative for metastatic disease. - Recommend evaluation by radiation oncology.  No indication for systemic chemotherapy. - I will reach out to our radiation oncology colleagues.  2.  COPD exacerbation: - Currently stable on 4 L and is on nebulizers and IV steroids.  3.  History of pulmonary embolism: - Continue apixaban.  All questions were answered. The patient knows to call the clinic with any problems, questions or concerns. We can certainly see the patient much sooner if necessary.    Derek Jack

## 2022-05-23 NOTE — TOC Progression Note (Signed)
Transition of Care [facility]) - Progression Note    Patient Details  Name: ZAIVION KUNDRAT MRN: [id number] Date of Birth: 06-Sep-1961  Transition of Care [facility]) CM/SW Contact  Salome Arnt, [place] Phone Number: 05/23/2022, 9:35 AM  Clinical Narrative:  LCSW followed up with pt to discuss d/c plan. TOC received consult for homeless issues. Pt is not homeless. He states he is unsure if he would want to go to a facility, but TOC can provide him options. He is aware no offers have been made at this point. Pt understands he would have to stay 30 days at least and turn his check over to facility. Pt states he still may decide to return home and will not go back to [employer].       Expected Discharge Plan: [place] Barriers to Discharge: Continued Medical Work up  Expected Discharge Plan and Services Expected Discharge Plan: [place]                                               Social Determinants of Health (SDOH) Interventions    Readmission Risk Interventions    05/06/2022   11:49 AM 05/01/2022    7:51 AM 03/14/2022    3:54 PM  Readmission Risk Prevention Plan  Transportation Screening Complete Complete Complete  Medication Review [employer]) Complete Complete Complete  PCP or Specialist appointment within 3-5 days of discharge   Not Complete  HRI or [place] Complete Complete Complete  SW Recovery Care/Counseling Consult Complete Complete Complete  Palliative Care Screening Not Applicable Not Applicable Not [place] Not Applicable Not Applicable Patient Refused

## 2022-05-23 NOTE — Assessment & Plan Note (Signed)
As above.

## 2022-05-23 NOTE — Progress Notes (Signed)
Pt continues to c/o sob after receiving multiple steroids, neb treatments and being on 5L of oxygen with a o2 sat of 100%. Notified MD that vitals are WDL.  This nurse gave morphine for SOB and will reassess pt.

## 2022-05-24 DIAGNOSIS — Z720 Tobacco use: Secondary | ICD-10-CM | POA: Diagnosis not present

## 2022-05-24 DIAGNOSIS — C3490 Malignant neoplasm of unspecified part of unspecified bronchus or lung: Secondary | ICD-10-CM | POA: Diagnosis not present

## 2022-05-24 DIAGNOSIS — J441 Chronic obstructive pulmonary disease with (acute) exacerbation: Secondary | ICD-10-CM | POA: Diagnosis not present

## 2022-05-24 DIAGNOSIS — L89102 Pressure ulcer of unspecified part of back, stage 2: Secondary | ICD-10-CM | POA: Diagnosis not present

## 2022-05-24 LAB — CREATININE, SERUM
Creatinine, Ser: 0.55 mg/dL — ABNORMAL LOW (ref 0.61–1.24)
GFR, Estimated: >60 mL/min (ref >60)

## 2022-05-24 NOTE — Progress Notes (Signed)
[facility]) Hospital Liaison note:  This patient is currently enrolled in [facility] outpatient-based Palliative Care. Will continue to follow for disposition.  Please call with any outpatient palliative questions or concerns.  Thank you, Lorelee Market, LPN [facility] Liaison [phone]

## 2022-05-24 NOTE — Progress Notes (Signed)
PROGRESS NOTE  Tony Sullivan WNU:[id number] DOB: 1961/09/18 DOA: 05/21/2022 PCP: [name], FNP  Brief History:   61 year old male with a history of COPD, chronic respiratory failure on 4.5 L at home, anxiety/depression, hypertension, pulmonary embolus (03/13/2022 CTA chest) on apixaban, NSCLC diagnosed 03/22/2022 via bronchoscopy (Dr. Elsworth Soho) presenting with worsening shortness of breath over the past 2 to 3 days.  The patient has had numerous hospital admissions with similar presentations.  It appears that the patient has poor social circumstances and poor ability to take care of himself.  The patient was admitted to the hospital from 04/30/2022 to 05/02/2022 secondary to COPD exacerbation.  Once again, he was admitted from 05/06/2022 to 05/08/2022 with a similar presentation.  He was discharged home with prednisone taper. Upon presentation, the patient was tachypneic with oxygen saturation 90% on 6 L.  He was started on IV Solu-Medrol, DuoNebs.  General surgery was consulted for his wounds.     Assessment and Plan: * Acute exacerbation of chronic obstructive pulmonary disease (COPD) ([place]) Currently stable on 4 L continue DuoNebs and Brovana Continue Pulmicort Continue IV Solu-Medrol Added Yupelri --aim for oxygen saturation 88-90% Morphine prn sob --he does not need to be on 5-6L as he claims he needs  Pressure injury of skin Multiple ulcers, some of which do not appear to be pressure injury related and some appear infected, but without surrounding signs of cellulitis. 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  He is afebrile without leukocytosis, and rules out for sepsis at this time. -MRI thoracic and lumbar spine--neg for osteo, discitis, abscess -appreciate wound care RN consult -Wound care consult for lower extremity ulcers -general surgery eval>>no   need for surgical debridement at this time.  Continue Santyl as  ordered  Decubitus ulcer of back, stage 2 ([place]) As above  History of pulmonary embolus (PE) Restart apixaban  Non-small cell lung cancer ([place]) Seen on CT 03/13/2022  -patient had bronchoscopy 5/26, showed endobronchial mass blocking takeoff of left upper lobe bronchus. 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   Patient has missed several appointments with oncology as outpatient for follow-up on his newly diagnosed lung cancer.   Dr. Delton Coombes was consulted during his recent hospitalization, recommended CAP CT With contrast and MRI brain (neither was suspicious for metastatic disease, though MRI was motion degraded ) for further staging of his large cell cancer, he was to follow-up after scans to discuss plan. --discussed with Dr. Delton Coombes 7/27>>no chemo, needs XRT which Dr. Raliegh Ip will help set up --discussed with Dr. Kenna Gilbert for 10 XRT treatments after d/c  Acute on chronic respiratory failure with hypoxia and hypercapnia ([place]) Chronically on 4 L nasal cannula Initially required 6 L nasal cannula with oxygen saturation 90% Wean oxygen as tolerated Secondary to COPD exacerbation --aim for oxygen saturation 88-92% --morphine prn sob --he does not need to be on 5-6L that he claims he needs  Bipolar disorder ([place]) Resume home meds  Tobacco abuse - Continues to smoke 1-2 packs per week per patient      Family Communication:  tried to call daughter 7/28  Consultants:  none  Code Status:  FULL   DVT Prophylaxis:  apixaban   Procedures: As Listed in Progress Note Above  Antibiotics: Vanc 7/26>>7/28 Levoflox 7/26>>7/28   RN Pressure Injury Documentation: Pressure Injury 05/06/22 Vertebral column Lower Unstageable -  Full thickness tissue loss in which the base of the injury is covered by slough (yellow, tan, gray, green or brown) and/or eschar (tan, brown or black) in the wound bed. some smaller Unstageab (Active)  05/06/22 1130  Location:  Vertebral column  Location Orientation: Lower  Staging: Unstageable - Full thickness tissue loss in which the base of the injury is covered by slough (yellow, tan, gray, green or brown) and/or eschar (tan, brown or black) in the wound bed.  Wound Description (Comments): some smaller Unstageable wounds surround the larger one also  Present on Admission: Yes  Dressing Type Foam - Lift dressing to assess site every shift 05/22/22 1956     Pressure Injury 05/06/22 Sacrum Medial Unstageable - Full thickness tissue loss in which the base of the injury is covered by slough (yellow, tan, gray, green or brown) and/or eschar (tan, brown or black) in the wound bed. (Active)  05/06/22 1130  Location: Sacrum  Location Orientation: Medial  Staging: Unstageable - Full thickness tissue loss in which the base of the injury is covered by slough (yellow, tan, gray, green or brown) and/or eschar (tan, brown or black) in the wound bed.  Wound Description (Comments):   Present on Admission: Yes  Dressing Type Foam - Lift dressing to assess site every shift 05/22/22 1956        Subjective: Patient states "I still feel tight".  Denies n/v/d, abd pain.  Has dry cough.  Objective: Vitals:   05/24/22 0807 05/24/22 0821 05/24/22 1335 05/24/22 1515  BP:  (!) 159/103 (!) 152/86   Pulse:  94 87   Resp:   (!) 22   Temp:   97.8 F (36.6 C)   TempSrc:   Oral   SpO2: 100%  100% 99%  Weight:      Height:        Intake/Output Summary (Last 24 hours) at 05/24/2022 1627 Last data filed at 05/24/2022 0900 Gross per 24 hour  Intake 990 ml  Output 1450 ml  Net -460 ml   Weight change:  Exam:  General:  Pt is alert, follows commands appropriately, not in acute distress HEENT: No icterus, No thrush, No neck mass, [place]/AT Cardiovascular: RRR, S1/S2, no rubs, no gallops Respiratory: Diminshed BS.  Bibasilar rales.  Bibasilar wheeze Abdomen: Soft/+BS, non tender, non distended, no guarding Extremities: No edema,  No lymphangitis, No petechiae, No rashes, no synovitis   Data Reviewed: I have personally reviewed following labs and imaging studies Basic Metabolic Panel: Recent Labs  Lab 05/21/22 1452 05/22/22 0634 05/24/22 0524  NA 140 144  --   K 4.1 4.1  --   CL 100 102  --   CO2 33* 34*  --   GLUCOSE 120* 115*  --   BUN 21* 17  --   CREATININE 0.88 0.78 0.55*  CALCIUM 9.4 9.0  --    Liver Function Tests: Recent Labs  Lab 05/21/22 1452  AST 18  ALT 19  ALKPHOS 166*  BILITOT 0.5  PROT 6.2*  ALBUMIN 2.7*   No results for input(s): "LIPASE", "AMYLASE" in the last 168 hours. No results for input(s): "AMMONIA" in the last 168 hours. Coagulation Profile: No results for input(s): "INR", "PROTIME" in the last 168 hours. CBC: Recent Labs  Lab 05/21/22 1452 05/22/22 0634  WBC 9.3 11.5*  NEUTROABS 7.9*  --   HGB 11.3* 10.4*  HCT 37.8* 35.2*  MCV 104.7* 105.1*  PLT 271 239   Cardiac Enzymes: No results for  input(s): "CKTOTAL", "CKMB", "CKMBINDEX", "TROPONINI" in the last 168 hours. BNP: Invalid input(s): "POCBNP" CBG: No results for input(s): "GLUCAP" in the last 168 hours. HbA1C: No results for input(s): "HGBA1C" in the last 72 hours. Urine analysis:    Component Value Date/Time   COLORURINE YELLOW 04/29/2022 0803   APPEARANCEUR HAZY (A) 04/29/2022 0803   LABSPEC 1.017 04/29/2022 0803   PHURINE 5.0 04/29/2022 0803   GLUCOSEU NEGATIVE 04/29/2022 0803   HGBUR NEGATIVE 04/29/2022 0803   BILIRUBINUR NEGATIVE 04/29/2022 0803   KETONESUR 20 (A) 04/29/2022 0803   PROTEINUR NEGATIVE 04/29/2022 0803   UROBILINOGEN 1.0 06/12/2015 1021   NITRITE NEGATIVE 04/29/2022 0803   LEUKOCYTESUR NEGATIVE 04/29/2022 0803   Sepsis Labs: @LABRCNTIP (procalcitonin:4,lacticidven:4) ) Recent Results (from the past 240 hour(s))  Resp Panel by RT-PCR (Flu A&B, Covid) Anterior Nasal Swab     Status: None   Collection Time: 05/21/22  4:00 PM   Specimen: Anterior Nasal Swab  Result Value Ref  Range Status   SARS Coronavirus 2 by RT PCR NEGATIVE NEGATIVE Final    Comment: (NOTE) SARS-CoV-2 target nucleic acids are NOT DETECTED.  The SARS-CoV-2 RNA is generally detectable in upper respiratory specimens during the acute phase of infection. The lowest concentration of SARS-CoV-2 viral copies this assay can detect is 138 copies/mL. A negative result does not preclude SARS-Cov-2 infection and should not be used as the sole basis for treatment or other patient management decisions. A negative result may occur with  improper specimen collection/handling, submission of specimen other than nasopharyngeal swab, presence of viral mutation(s) within the areas targeted by this assay, and inadequate number of viral copies(<138 copies/mL). A negative result must be combined with clinical observations, patient history, and epidemiological information. The expected result is Negative.  Fact Sheet for Patients:  EntrepreneurPulse.com.au  Fact Sheet for Healthcare Providers:  IncredibleEmployment.be  This test is no t yet approved or cleared by the [place] FDA and  has been authorized for detection and/or diagnosis of SARS-CoV-2 by FDA under an Emergency Use Authorization (EUA). This EUA will remain  in effect (meaning this test can be used) for the duration of the COVID-19 declaration under Section 564(b)(1) of the Act, 21 U.S.C.section 360bbb-3(b)(1), unless the authorization is terminated  or revoked sooner.       Influenza A by PCR NEGATIVE NEGATIVE Final   Influenza B by PCR NEGATIVE NEGATIVE Final    Comment: (NOTE) The Xpert Xpress SARS-CoV-2/FLU/RSV plus assay is intended as an aid in the diagnosis of influenza from Nasopharyngeal swab specimens and should not be used as a sole basis for treatment. Nasal washings and aspirates are unacceptable for Xpert Xpress SARS-CoV-2/FLU/RSV testing.  Fact Sheet for  Patients: EntrepreneurPulse.com.au  Fact Sheet for Healthcare Providers: IncredibleEmployment.be  This test is not yet approved or cleared by the [place] FDA and has been authorized for detection and/or diagnosis of SARS-CoV-2 by FDA under an Emergency Use Authorization (EUA). This EUA will remain in effect (meaning this test can be used) for the duration of the COVID-19 declaration under Section 564(b)(1) of the Act, 21 U.S.C. section 360bbb-3(b)(1), unless the authorization is terminated or revoked.  Performed at [facility], [address], [place], [place] [address]   MRSA Next Gen by PCR, Nasal     Status: None   Collection Time: 05/21/22  8:06 PM   Specimen: Nasal Mucosa; Nasal Swab  Result Value Ref Range Status   MRSA by PCR Next Gen NOT DETECTED NOT DETECTED Final  Comment: (NOTE) The GeneXpert MRSA Assay (FDA approved for NASAL specimens only), is one component of a comprehensive MRSA colonization surveillance program. It is not intended to diagnose MRSA infection nor to guide or monitor treatment for MRSA infections. Test performance is not FDA approved in patients less than 74 years old. Performed at [facility], [address] [place], [place] [address]      Scheduled Meds:  apixaban  5 mg Oral BID   arformoterol  15 mcg Nebulization BID   atorvastatin  40 mg Oral Daily   budesonide (PULMICORT) nebulizer solution  0.5 mg Nebulization BID   Chlorhexidine Gluconate Cloth  6 each Topical Daily   collagenase   Topical Daily   dorzolamide  1 drop Left Eye BID   gabapentin  100 mg Oral TID   guaiFENesin  1,200 mg Oral BID   ipratropium-albuterol  3 mL Nebulization Q6H   latanoprost  1 drop Both Eyes QHS   methylPREDNISolone (SOLU-MEDROL) injection  60 mg Intravenous Q12H   pantoprazole  40 mg Oral BID   revefenacin  175 mcg Nebulization Daily   silver sulfADIAZINE   Topical Daily   Continuous  Infusions:  Procedures/Studies: MR THORACIC SPINE WO CONTRAST  Result Date: 05/21/2022 CLINICAL DATA:  Initial evaluation for soft tissue ulceration to back. EXAM: MRI THORACIC AND LUMBAR SPINE WITHOUT CONTRAST TECHNIQUE: Multiplanar and multiecho pulse sequences of the thoracic and lumbar spine were obtained without intravenous contrast. COMPARISON:  Prior MRI from 08/26/2019. FINDINGS: MRI THORACIC SPINE FINDINGS Alignment: Physiologic with preservation of the normal thoracic kyphosis. No significant listhesis. Vertebrae: Susceptibility artifact related to prior ACDF noted at C5-6 on counter sequence. Underlying bone marrow signal intensity within normal limits. No worrisome osseous lesions. Multiple acute to subacute compression fractures are seen as follows; 1. T5 compression fracture with up to 50% height loss and trace 2 mm bony retropulsion. 2. T6 compression fracture with up to 35% height loss and trace 2 mm bony retropulsion. 3. T7 compression fracture with 35% height loss without bony retropulsion. 4. T8 compression fracture with up to 50% height loss and 3 mm bony retropulsion. 5. T9 compression fracture with 20% height loss without significant bony retropulsion. 6. T11 compression fracture with mild 35% central height loss without significant bony retropulsion. 7. T12 compression fracture with mild 15% height loss without significant bony retropulsion. Marrow edema involving the left transverse process of T6 likely reflects an acute nondisplaced fracture (a series 18, image 15). Additional chronic T10 compression fracture with mild 30% central height loss without retropulsion noted. No evidence for osteomyelitis discitis within the thoracic spine. Cord:  Normal signal and morphology. Paraspinal and other soft tissues: Mild diffuse edema noted within the subcutaneous fat of the posterior thoracolumbar spine. No loculated collections. Consolidative opacity at the visualized posterior left lung base,  which could reflect atelectasis or infiltrate. Disc levels: No significant disc pathology seen within the thoracic spine for age. Trace bony retropulsion about several of the compression fractures as above. No significant spinal stenosis. Foramina remain largely patent. MRI LUMBAR SPINE FINDINGS Segmentation: Transitional features seen about the lumbosacral junction with a partially lumbarized S1 segment. Alignment: Physiologic with preservation of the normal lumbar lordosis. No significant listhesis. Vertebrae: Bone marrow signal intensity within normal limits. No discrete or worrisome osseous lesions. Several acute to early subacute compression fractures are seen as follows; 1. Compression fracture involving the superior endplate of L2 with 18% central height loss with trace 2 mm bony retropulsion. 2. Compression fracture  involving the L3 vertebral body with mild 10% height loss without significant bony retropulsion. 3. Acute to subacute on chronic compression fracture involving the L4 vertebral body with complete central height loss with trace 2 mm bony retropulsion. 4. Compression fracture involving the superior endplate of L5 with mild 20% height loss without bony retropulsion. 5. Compression fracture involving the superior endplate of S1 without significant height loss or bony retropulsion. Conus medullaris and cauda equina: Conus extends to the L1 level. Conus and cauda equina appear normal. Paraspinal and other soft tissues: Soft tissue defect overlying the spinous processes of L1 through L3, compatible with ulceration (series 2, image 6). Diffuse edema within the underlying subcutaneous fat and paraspinous musculature. No discrete or loculated collections. No convincing evidence for osteomyelitis discitis or septic arthritis. Visualized visceral structures within normal limits. Disc levels: L1-2: Negative interspace. Mild facet hypertrophy. No significant stenosis. L2-3: Mild disc bulge with trace bony  retropulsion. Mild facet hypertrophy. No significant spinal stenosis. Foramina remain patent. L3-4: Disc bulge with disc desiccation. Superimposed small left foraminal disc protrusion closely approximates the exiting left L3 nerve root (series 5, image 16). Mild facet hypertrophy. No significant spinal stenosis. Foramina remain patent. L4-5: Mild disc bulge with disc desiccation and trace bony retropulsion. Mild facet hypertrophy. No significant spinal stenosis. Moderate right greater than left L4 foraminal narrowing. L5-S1: Mild diffuse disc bulge. Moderate facet hypertrophy with associated small joint effusions. No significant spinal stenosis. Moderate right with mild-to-moderate left L5 foraminal narrowing. S1-2: Rudimentary S1-2 interspace. No disc bulge or focal disc herniation. No stenosis. IMPRESSION: 1. Multiple acute to subacute compression fractures involving the T5, T6, T7, T8, T9, T11, T12, L2, L3, L4, L5, and S1 as detailed above. No more than trace bony retropulsion at several levels without significant spinal stenosis. 2. Soft tissue ulceration overlying the lower back at the level of the spinous processes of L1 through L3. Diffuse edema throughout the underlying posterior paraspinous soft tissues without loculated or drainable fluid collection. No convincing evidence for osteomyelitis discitis or septic arthritis within the thoracolumbar spine itself. 3. Mild multilevel lumbar spondylosis without significant spinal stenosis. Moderate bilateral L4 and L5 foraminal narrowing. 4. Patchy consolidative opacity within the posterior left lung base, either atelectasis or infiltrate. Electronically Signed   By: Jeannine Boga M.D.   On: 05/21/2022 20:06   MR LUMBAR SPINE WO CONTRAST  Result Date: 05/21/2022 CLINICAL DATA:  Initial evaluation for soft tissue ulceration to back. EXAM: MRI THORACIC AND LUMBAR SPINE WITHOUT CONTRAST TECHNIQUE: Multiplanar and multiecho pulse sequences of the thoracic  and lumbar spine were obtained without intravenous contrast. COMPARISON:  Prior MRI from 08/26/2019. FINDINGS: MRI THORACIC SPINE FINDINGS Alignment: Physiologic with preservation of the normal thoracic kyphosis. No significant listhesis. Vertebrae: Susceptibility artifact related to prior ACDF noted at C5-6 on counter sequence. Underlying bone marrow signal intensity within normal limits. No worrisome osseous lesions. Multiple acute to subacute compression fractures are seen as follows; 1. T5 compression fracture with up to 50% height loss and trace 2 mm bony retropulsion. 2. T6 compression fracture with up to 35% height loss and trace 2 mm bony retropulsion. 3. T7 compression fracture with 35% height loss without bony retropulsion. 4. T8 compression fracture with up to 50% height loss and 3 mm bony retropulsion. 5. T9 compression fracture with 20% height loss without significant bony retropulsion. 6. T11 compression fracture with mild 35% central height loss without significant bony retropulsion. 7. T12 compression fracture with mild 15% height loss without  significant bony retropulsion. Marrow edema involving the left transverse process of T6 likely reflects an acute nondisplaced fracture (a series 18, image 15). Additional chronic T10 compression fracture with mild 30% central height loss without retropulsion noted. No evidence for osteomyelitis discitis within the thoracic spine. Cord:  Normal signal and morphology. Paraspinal and other soft tissues: Mild diffuse edema noted within the subcutaneous fat of the posterior thoracolumbar spine. No loculated collections. Consolidative opacity at the visualized posterior left lung base, which could reflect atelectasis or infiltrate. Disc levels: No significant disc pathology seen within the thoracic spine for age. Trace bony retropulsion about several of the compression fractures as above. No significant spinal stenosis. Foramina remain largely patent. MRI LUMBAR  SPINE FINDINGS Segmentation: Transitional features seen about the lumbosacral junction with a partially lumbarized S1 segment. Alignment: Physiologic with preservation of the normal lumbar lordosis. No significant listhesis. Vertebrae: Bone marrow signal intensity within normal limits. No discrete or worrisome osseous lesions. Several acute to early subacute compression fractures are seen as follows; 1. Compression fracture involving the superior endplate of L2 with 57% central height loss with trace 2 mm bony retropulsion. 2. Compression fracture involving the L3 vertebral body with mild 10% height loss without significant bony retropulsion. 3. Acute to subacute on chronic compression fracture involving the L4 vertebral body with complete central height loss with trace 2 mm bony retropulsion. 4. Compression fracture involving the superior endplate of L5 with mild 20% height loss without bony retropulsion. 5. Compression fracture involving the superior endplate of S1 without significant height loss or bony retropulsion. Conus medullaris and cauda equina: Conus extends to the L1 level. Conus and cauda equina appear normal. Paraspinal and other soft tissues: Soft tissue defect overlying the spinous processes of L1 through L3, compatible with ulceration (series 2, image 6). Diffuse edema within the underlying subcutaneous fat and paraspinous musculature. No discrete or loculated collections. No convincing evidence for osteomyelitis discitis or septic arthritis. Visualized visceral structures within normal limits. Disc levels: L1-2: Negative interspace. Mild facet hypertrophy. No significant stenosis. L2-3: Mild disc bulge with trace bony retropulsion. Mild facet hypertrophy. No significant spinal stenosis. Foramina remain patent. L3-4: Disc bulge with disc desiccation. Superimposed small left foraminal disc protrusion closely approximates the exiting left L3 nerve root (series 5, image 16). Mild facet hypertrophy. No  significant spinal stenosis. Foramina remain patent. L4-5: Mild disc bulge with disc desiccation and trace bony retropulsion. Mild facet hypertrophy. No significant spinal stenosis. Moderate right greater than left L4 foraminal narrowing. L5-S1: Mild diffuse disc bulge. Moderate facet hypertrophy with associated small joint effusions. No significant spinal stenosis. Moderate right with mild-to-moderate left L5 foraminal narrowing. S1-2: Rudimentary S1-2 interspace. No disc bulge or focal disc herniation. No stenosis. IMPRESSION: 1. Multiple acute to subacute compression fractures involving the T5, T6, T7, T8, T9, T11, T12, L2, L3, L4, L5, and S1 as detailed above. No more than trace bony retropulsion at several levels without significant spinal stenosis. 2. Soft tissue ulceration overlying the lower back at the level of the spinous processes of L1 through L3. Diffuse edema throughout the underlying posterior paraspinous soft tissues without loculated or drainable fluid collection. No convincing evidence for osteomyelitis discitis or septic arthritis within the thoracolumbar spine itself. 3. Mild multilevel lumbar spondylosis without significant spinal stenosis. Moderate bilateral L4 and L5 foraminal narrowing. 4. Patchy consolidative opacity within the posterior left lung base, either atelectasis or infiltrate. Electronically Signed   By: Jeannine Boga M.D.   On: 05/21/2022 20:06  DG Chest Port 1 View  Result Date: 05/21/2022 CLINICAL DATA:  Shortness of breath.  Smoker. EXAM: PORTABLE CHEST 1 VIEW COMPARISON:  05/15/2022 FINDINGS: Normal sized heart. Clear lungs. The lungs remain mildly hyperexpanded. Previously described callus associated with the right posterior 6th rib, compatible with an old, healed fracture. IMPRESSION: No acute abnormality.  Stable mild changes of COPD. Electronically Signed   By: Claudie Revering M.D.   On: 05/21/2022 15:44   CT Angio Chest PE W and/or Wo Contrast  Result Date:  05/15/2022 CLINICAL DATA:  Hemoptysis since yesterday morning, non-small cell lung cancer EXAM: CT ANGIOGRAPHY CHEST WITH CONTRAST TECHNIQUE: Multidetector CT imaging of the chest was performed using the standard protocol during bolus administration of intravenous contrast. Multiplanar CT image reconstructions and MIPs were obtained to evaluate the vascular anatomy. RADIATION DOSE REDUCTION: This exam was performed according to the departmental dose-optimization program which includes automated exposure control, adjustment of the mA and/or kV according to patient size and/or use of iterative reconstruction technique. CONTRAST:  66mL OMNIPAQUE IOHEXOL 350 MG/ML SOLN COMPARISON:  05/07/2022 FINDINGS: Cardiovascular: This is a technically adequate evaluation of the pulmonary vasculature. There are no filling defects or pulmonary emboli. The heart is unremarkable without pericardial effusion. No evidence of thoracic aortic aneurysm or dissection. Stable atherosclerosis of the aorta and coronary vasculature. Mediastinum/Nodes: No enlarged mediastinal, hilar, or axillary lymph nodes. Thyroid gland, trachea, and esophagus demonstrate no significant findings. Lungs/Pleura: No significant change in the large endobronchial lesion occupying the majority of the left upper lobe bronchial tree. Severe upper lobe predominant emphysema is again noted. No acute airspace disease. Trace left pleural effusion. No pneumothorax. Upper Abdomen: Stable nodular right adrenal thickening measuring 2 x 1 cm, mean attenuation -3 HU, most consistent with adenoma. No acute upper abdominal findings. Musculoskeletal: No acute or destructive bony lesions. Multiple prior healed bilateral rib fractures. Chronic compression deformities at T5, T7, T8, T11, and T12. Reconstructed images demonstrate no additional findings. Review of the MIP images confirms the above findings. IMPRESSION: 1. No evidence of pulmonary embolus. 2. Stable left upper lobe  endobronchial mass consistent with known lung cancer. 3. Trace left pleural effusion. 4. Aortic Atherosclerosis (ICD10-I70.0) and Emphysema (ICD10-J43.9). 5. Stable right adrenal nodule, previously characterized as adenoma. Electronically Signed   By: Randa Ngo M.D.   On: 05/15/2022 18:04   CT Head Wo Contrast  Result Date: 05/15/2022 CLINICAL DATA:  Mental status change. EXAM: CT HEAD WITHOUT CONTRAST TECHNIQUE: Contiguous axial images were obtained from the base of the skull through the vertex without intravenous contrast. RADIATION DOSE REDUCTION: This exam was performed according to the departmental dose-optimization program which includes automated exposure control, adjustment of the mA and/or kV according to patient size and/or use of iterative reconstruction technique. COMPARISON:  None Available. FINDINGS: Brain: No evidence of acute infarction, hemorrhage, hydrocephalus, extra-axial collection or mass lesion/mass effect. Vascular: No hyperdense vessel or unexpected calcification. Skull: Normal. Negative for fracture or focal lesion. Sinuses/Orbits: Minimal polypoid thickening in the maxillary sinuses Other: None. IMPRESSION: No acute intracranial abnormality. Electronically Signed   By: Fidela Salisbury M.D.   On: 05/15/2022 18:02   DG Chest Portable 1 View  Result Date: 05/15/2022 CLINICAL DATA:  Hemoptysis.  Pain. EXAM: PORTABLE CHEST 1 VIEW COMPARISON:  05/07/2022 CT and 05/06/2022 chest x-ray FINDINGS: Heart size is normal. There is perihilar peribronchial thickening. No new consolidations or pleural effusions. No evidence for pulmonary edema. Remote RIGHT posterior 6th rib fracture. IMPRESSION: No evidence for acute abnormality.  Chronic bronchitic changes. Electronically Signed   By: Nolon Nations M.D.   On: 05/15/2022 13:48   CT CHEST ABDOMEN PELVIS W CONTRAST  Result Date: 05/08/2022 CLINICAL DATA:  Non-small cell lung cancer, acute respiratory failure with hypoxia, failure to  thrive, nausea/vomiting EXAM: CT CHEST, ABDOMEN, AND PELVIS WITH CONTRAST TECHNIQUE: Multidetector CT imaging of the chest, abdomen and pelvis was performed following the standard protocol during bolus administration of intravenous contrast. RADIATION DOSE REDUCTION: This exam was performed according to the departmental dose-optimization program which includes automated exposure control, adjustment of the mA and/or kV according to patient size and/or use of iterative reconstruction technique. CONTRAST:  127mL OMNIPAQUE IOHEXOL 300 MG/ML  SOLN COMPARISON:  CT chest dated 03/18/2022. CTA chest abdomen pelvis dated 10/17/2021. FINDINGS: CT CHEST FINDINGS Cardiovascular: No evidence of thoracic aortic aneurysm. Mild atherosclerotic calcifications of the aortic arch. The heart is normal in size.  No pericardial effusion. Coronary atherosclerosis of the LAD and left circumflex. Mediastinum/Nodes: No suspicious mediastinal lymphadenopathy. Visualized thyroid is unremarkable. Lungs/Pleura: Endobronchial soft tissue in the proximal left upper lobe bronchus (series 2/image 34) and extending into the central left upper lobe (series 2/image 28), difficult to discretely measure but measuring approximately 4.9 cm in maximal craniocaudal dimension (coronal image 80) new. This corresponds to the patient's known primary bronchogenic neoplasm and is favored to be mildly progressive. Moderate centrilobular and paraseptal emphysematous changes, upper lung predominant. No focal consolidation. No pleural effusion or pneumothorax. Musculoskeletal: Moderate compression fracture deformity at T5 and T8, grossly unchanged. Mild superior endplate changes at J18. Cervical spine fixation hardware, incompletely visualized. CT ABDOMEN PELVIS FINDINGS Hepatobiliary: Liver is within normal limits. No suspicious/enhancing hepatic lesions. Gallbladder is underdistended but unremarkable. No intrahepatic or extrahepatic duct dilatation. Pancreas: Within  normal limits. Spleen: Within normal limits. Adrenals/Urinary Tract: Stable 2.1 cm right adrenal nodule (series 2/image 68), unchanged from remote priors, favoring a benign adrenal adenoma. Left adrenal gland is within normal limits. Kidneys are notable for probable early excretory contrast in the collecting systems. No hydronephrosis. Excretory contrast in the bladder. Stomach/Bowel: Mildly distended stomach. No evidence of bowel obstruction. Appendix is not discretely visualized. No colonic wall thickening or inflammatory changes. Vascular/Lymphatic: No evidence of abdominal aortic aneurysm. Atherosclerotic calcifications of the abdominal aorta and branch vessels. No suspicious abdominopelvic lymphadenopathy. Reproductive: Prostate is unremarkable. Other: No abdominopelvic ascites. Musculoskeletal: Moderate compression fracture deformity at L4, unchanged. Mild superior endplate compression fracture deformity at L2 and L5, new. Mild superior endplate changes at L1, unchanged. IMPRESSION: Stable central left upper lobe mass, corresponding to the patient's known primary bronchogenic neoplasm. No findings suspicious for metastatic disease. Stable benign right adrenal adenoma. Multiple thoracolumbar compression fracture deformities, with new mild compression fracture deformities at L2 and L5. Aortic Atherosclerosis (ICD10-I70.0) and Emphysema (ICD10-J43.9). Electronically Signed   By: Julian Hy M.D.   On: 05/08/2022 01:08   MR Brain W Wo Contrast  Result Date: 05/07/2022 EXAM: MRI HEAD WITHOUT AND WITH CONTRAST MRA HEAD WITHOUT CONTRAST TECHNIQUE: Multiplanar, multi-echo pulse sequences of the brain and surrounding structures were acquired without and with intravenous contrast. Angiographic images of the Circle of Willis were acquired using MRA technique without intravenous contrast. CONTRAST:  56mL GADAVIST GADOBUTROL 1 MMOL/ML IV SOLN COMPARISON:  Prior CT from 10/27/2021. FINDINGS: MRI HEAD FINDINGS  Brain: Examination moderately degraded by motion artifact. Cerebral volume within normal limits. Patchy T2/FLAIR hyperintensity involving the periventricular deep white matter both cerebral hemispheres as well as the pons, most consistent with chronic small vessel  ischemic disease, mild in nature. No evidence for acute or subacute infarct. Gray-white matter differentiation maintained. No areas of chronic cortical infarction. No acute or chronic intracranial blood products. No mass lesion, midline shift or mass effect no hydrocephalus or extra-axial fluid collection. Pituitary gland suprasellar region normal. No abnormal enhancement. Vascular: Major intracranial vascular flow voids are maintained. Skull and upper cervical spine: Craniocervical junction within normal limits. Bone marrow signal intensity normal. No focal marrow replacing lesion. No scalp soft tissue abnormality. Sinuses/Orbits: Prior bilateral ocular lens replacement. Mild scattered mucosal thickening with a few small retention cysts noted about the ethmoidal air cells and maxillary sinuses. No significant mastoid effusion. Other: None. MRA HEAD FINDINGS Anterior circulation: Examination moderately degraded by motion artifact. Both internal carotid arteries patent to the termini without significant stenosis or other appreciable abnormality. A1 segments patent bilaterally. Grossly normal anterior communicating artery complex. Both ACAs grossly patent to their distal aspects without appreciable stenosis. No visible M1 stenosis or occlusion. No proximal MCA branch occlusion. Distal MCA branches grossly perfused and symmetric. Posterior circulation: Visualized distal V4 segments patent without stenosis. Right vertebral artery dominant. Neither PICA well visualized. Basilar patent to its distal aspect without stenosis. Superior cerebellar and posterior cerebral arteries patent bilaterally with no appreciable significant stenosis. Anatomic variants: None  significant. IMPRESSION: MRI HEAD IMPRESSION: 1. Motion artifact degraded exam. 2. No acute intracranial abnormality. No evidence for intracranial metastatic disease. 3. Mild chronic microvascular ischemic disease. MRA HEAD IMPRESSION: 1. Motion degraded exam. 2. Grossly negative intracranial MRA. No large vessel occlusion, hemodynamically significant stenosis, or other acute vascular abnormality. Electronically Signed   By: Jeannine Boga M.D.   On: 05/07/2022 20:36   MR ANGIO HEAD WO CONTRAST  Result Date: 05/07/2022 EXAM: MRI HEAD WITHOUT AND WITH CONTRAST MRA HEAD WITHOUT CONTRAST TECHNIQUE: Multiplanar, multi-echo pulse sequences of the brain and surrounding structures were acquired without and with intravenous contrast. Angiographic images of the Circle of Willis were acquired using MRA technique without intravenous contrast. CONTRAST:  40mL GADAVIST GADOBUTROL 1 MMOL/ML IV SOLN COMPARISON:  Prior CT from 10/27/2021. FINDINGS: MRI HEAD FINDINGS Brain: Examination moderately degraded by motion artifact. Cerebral volume within normal limits. Patchy T2/FLAIR hyperintensity involving the periventricular deep white matter both cerebral hemispheres as well as the pons, most consistent with chronic small vessel ischemic disease, mild in nature. No evidence for acute or subacute infarct. Gray-white matter differentiation maintained. No areas of chronic cortical infarction. No acute or chronic intracranial blood products. No mass lesion, midline shift or mass effect no hydrocephalus or extra-axial fluid collection. Pituitary gland suprasellar region normal. No abnormal enhancement. Vascular: Major intracranial vascular flow voids are maintained. Skull and upper cervical spine: Craniocervical junction within normal limits. Bone marrow signal intensity normal. No focal marrow replacing lesion. No scalp soft tissue abnormality. Sinuses/Orbits: Prior bilateral ocular lens replacement. Mild scattered mucosal  thickening with a few small retention cysts noted about the ethmoidal air cells and maxillary sinuses. No significant mastoid effusion. Other: None. MRA HEAD FINDINGS Anterior circulation: Examination moderately degraded by motion artifact. Both internal carotid arteries patent to the termini without significant stenosis or other appreciable abnormality. A1 segments patent bilaterally. Grossly normal anterior communicating artery complex. Both ACAs grossly patent to their distal aspects without appreciable stenosis. No visible M1 stenosis or occlusion. No proximal MCA branch occlusion. Distal MCA branches grossly perfused and symmetric. Posterior circulation: Visualized distal V4 segments patent without stenosis. Right vertebral artery dominant. Neither PICA well visualized. Basilar patent to its distal aspect  without stenosis. Superior cerebellar and posterior cerebral arteries patent bilaterally with no appreciable significant stenosis. Anatomic variants: None significant. IMPRESSION: MRI HEAD IMPRESSION: 1. Motion artifact degraded exam. 2. No acute intracranial abnormality. No evidence for intracranial metastatic disease. 3. Mild chronic microvascular ischemic disease. MRA HEAD IMPRESSION: 1. Motion degraded exam. 2. Grossly negative intracranial MRA. No large vessel occlusion, hemodynamically significant stenosis, or other acute vascular abnormality. Electronically Signed   By: Jeannine Boga M.D.   On: 05/07/2022 20:36   DG Chest Port 1 View  Result Date: 05/06/2022 CLINICAL DATA:  Shortness of breath EXAM: PORTABLE CHEST 1 VIEW COMPARISON:  04/29/2022 radiograph and 03/18/2022 chest CT FINDINGS: Chronic hyperinflation and interstitial coarsening. Biapical lucency. Vague rounded density over the right chest correlates with rib fracture callus by CT 2 months ago. Stable appearance of left supra hilum where there is endobronchial mass and airway obstruction by comparison CT. Normal heart size and  mediastinal contours. IMPRESSION: COPD without acute superimposed finding. Electronically Signed   By: Jorje Guild M.D.   On: 05/06/2022 04:49   DG Chest Port 1 View  Result Date: 04/29/2022 CLINICAL DATA:  Shortness of breath and hypoxemia. EXAM: PORTABLE CHEST 1 VIEW COMPARISON:  Portable chest 04/22/2022. FINDINGS: The lungs are emphysematous and clear of acute infiltrates. There is scattered linear scarring. Heart size and vasculature are normal with stable mediastinal silhouette. Mild aortic atherosclerosis. Osteopenia. IMPRESSION: Stable COPD chest with no acute cardiopulmonary findings. Electronically Signed   By: Telford Nab M.D.   On: 04/29/2022 03:14    Orson Eva, DO  [facility]  If 7PM-7AM, please contact night-coverage [email or web address] Password [facility] 05/24/2022, 4:27 PM   LOS: 3 days

## 2022-05-24 NOTE — TOC Progression Note (Addendum)
Transition of Care [facility]) - Progression Note    Patient Details  Name: Tony Sullivan MRN: [id number] Date of Birth: [birth year]  [facility]) CM/SW Contact  Ihor Gully, LCSW Phone Number: 05/24/2022, 2:03 PM  Clinical Narrative:    Advised patient that he was not accepted by any facilities that had been referred to. Discussed that he would have to go home. Patient asked why no facilities had accepted him. Discussed that patient had recently left SNF against medical advice and that facilities are not willing extend an offer.  Patient request wound care. Discussed that patient would have to go to the wound care center for wound care. Patient agreeable. Referral made to wound care center at [place] office in [place].    Expected Discharge Plan: [place] Barriers to Discharge: Continued Medical Work up  Expected Discharge Plan and Services Expected Discharge Plan: [place]                                               Social Determinants of Health (SDOH) Interventions    Readmission Risk Interventions    05/06/2022   11:49 AM 05/01/2022    7:51 AM 03/14/2022    3:54 PM  Readmission Risk Prevention Plan  Transportation Screening Complete Complete Complete  Medication Review Press photographer) Complete Complete Complete  PCP or Specialist appointment within 3-5 days of discharge   Not Complete  HRI or [place] Complete Complete Complete  SW Recovery Care/Counseling Consult Complete Complete Complete  Palliative Care Screening Not Applicable Not Applicable Not [place] Not Applicable Not Applicable Patient Refused

## 2022-05-24 NOTE — Progress Notes (Addendum)
  Subjective: No new complaints  Objective: Vital signs in last 24 hours: Temp:  [97.6 F (36.4 C)-98.2 F (36.8 C)] 97.6 F (36.4 C) (07/28 0500) Pulse Rate:  [78-103] 94 (07/28 0821) Resp:  [18-20] 18 (07/28 0500) BP: (151-160)/(95-103) 159/103 (07/28 0821) SpO2:  [98 %-100 %] 100 % (07/28 0807) Last BM Date : 05/21/22  Intake/Output from previous day: 07/27 0701 - 07/28 0700 In: 870 [P.O.:720; IV Piggyback:150] Out: 1450 [Urine:1450] Intake/Output this shift: No intake/output data recorded.  General appearance: cooperative and no distress Skin: Upper wound healing well.  No significant necrotic tissue.  No purulent drainage.  Lab Results:  Recent Labs    05/21/22 1452 05/22/22 0634  WBC 9.3 11.5*  HGB 11.3* 10.4*  HCT 37.8* 35.2*  PLT 271 239   BMET Recent Labs    05/21/22 1452 05/22/22 0634 05/24/22 0524  NA 140 144  --   K 4.1 4.1  --   CL 100 102  --   CO2 33* 34*  --   GLUCOSE 120* 115*  --   BUN 21* 17  --   CREATININE 0.88 0.78 0.55*  CALCIUM 9.4 9.0  --    PT/INR No results for input(s): "LABPROT", "INR" in the last 72 hours.  Studies/Results: No results found.  Anti-infectives: Anti-infectives (From admission, onward)    Start     Dose/Rate Route Frequency Ordered Stop   05/23/22 0000  levofloxacin (LEVAQUIN) IVPB 500 mg        500 mg 100 mL/hr over 60 Minutes Intravenous Every 24 hours 05/22/22 0049     05/22/22 1700  vancomycin (VANCOREADY) IVPB 750 mg/150 mL        750 mg 150 mL/hr over 60 Minutes Intravenous Every 12 hours 05/22/22 0856     05/22/22 0145  levofloxacin (LEVAQUIN) IVPB 500 mg        500 mg 100 mL/hr over 60 Minutes Intravenous  Once 05/22/22 0049 05/22/22 0222   05/21/22 2215  vancomycin (VANCOREADY) IVPB 1750 mg/350 mL  Status:  Discontinued        1,750 mg 175 mL/hr over 120 Minutes Intravenous Every 24 hours 05/21/22 2129 05/22/22 0856       Assessment/Plan: Impression: Upper back decubitus ulcer healing  well.  No need for surgical intervention.  May resume anticoagulation as needed.  We will follow peripherally with you.  Follow-up with wound care center as an outpatient once discharged.  LOS: 3 days    Aviva Signs 05/24/2022

## 2022-05-24 NOTE — Progress Notes (Signed)
All wound dressings changed for this shift.

## 2022-05-25 DIAGNOSIS — J441 Chronic obstructive pulmonary disease with (acute) exacerbation: Secondary | ICD-10-CM | POA: Diagnosis not present

## 2022-05-25 DIAGNOSIS — J9621 Acute and chronic respiratory failure with hypoxia: Secondary | ICD-10-CM | POA: Diagnosis not present

## 2022-05-25 DIAGNOSIS — L89102 Pressure ulcer of unspecified part of back, stage 2: Secondary | ICD-10-CM | POA: Diagnosis not present

## 2022-05-25 DIAGNOSIS — J9622 Acute and chronic respiratory failure with hypercapnia: Secondary | ICD-10-CM | POA: Diagnosis not present

## 2022-05-25 LAB — CBC
HCT: 33.8 % — ABNORMAL LOW (ref 39.0–52.0)
Hemoglobin: 10.1 g/dL — ABNORMAL LOW (ref 13.0–17.0)
MCH: 30.1 pg (ref 26.0–34.0)
MCHC: 29.9 g/dL — ABNORMAL LOW (ref 30.0–36.0)
MCV: 100.9 fL — ABNORMAL HIGH (ref 80.0–100.0)
Platelets: 243 10*3/uL (ref 150–400)
RBC: 3.35 MIL/uL — ABNORMAL LOW (ref 4.22–5.81)
RDW: 15.4 % (ref 11.5–15.5)
WBC: 15.2 10*3/uL — ABNORMAL HIGH (ref 4.0–10.5)
nRBC: 0 % (ref 0.0–0.2)

## 2022-05-25 LAB — PHOSPHORUS: Phosphorus: 1.9 mg/dL — ABNORMAL LOW (ref 2.5–4.6)

## 2022-05-25 LAB — BASIC METABOLIC PANEL
Anion gap: 6 (ref 5–15)
BUN: 14 mg/dL (ref 6–20)
CO2: 39 mmol/L — ABNORMAL HIGH (ref 22–32)
Calcium: 8.9 mg/dL (ref 8.9–10.3)
Chloride: 95 mmol/L — ABNORMAL LOW (ref 98–111)
Creatinine, Ser: 0.53 mg/dL — ABNORMAL LOW (ref 0.61–1.24)
GFR, Estimated: >60 mL/min (ref >60)
Glucose, Bld: 113 mg/dL — ABNORMAL HIGH (ref 70–99)
Potassium: 3.8 mmol/L (ref 3.5–5.1)
Sodium: 140 mmol/L (ref 135–145)

## 2022-05-25 LAB — MAGNESIUM: Magnesium: 1.9 mg/dL (ref 1.7–2.4)

## 2022-05-25 MED ORDER — K PHOS MONO-SOD PHOS DI & MONO 155-852-130 MG PO TABS
500.0000 mg | ORAL_TABLET | Freq: Two times a day (BID) | ORAL | Status: DC
  Administered 2022-05-25 – 2022-05-28 (×6): 500 mg via ORAL
  Filled 2022-05-25 (×6): qty 2

## 2022-05-25 MED ORDER — IPRATROPIUM-ALBUTEROL 0.5-2.5 (3) MG/3ML IN SOLN
3.0000 mL | Freq: Three times a day (TID) | RESPIRATORY_TRACT | Status: DC
Start: 2022-05-25 — End: 2022-05-26
  Administered 2022-05-25: 3 mL via RESPIRATORY_TRACT
  Filled 2022-05-25 (×4): qty 3

## 2022-05-25 MED ORDER — SODIUM CHLORIDE 3 % IN NEBU
4.0000 mL | INHALATION_SOLUTION | Freq: Two times a day (BID) | RESPIRATORY_TRACT | Status: DC
Start: 2022-05-25 — End: 2022-05-26

## 2022-05-25 NOTE — Progress Notes (Signed)
Patient refused dressing changes to his legs, feet, and back multiple times throughout the day. This nurse offered to give patient a bath, get him out of bed, perform dressing changes, and change linen. Patient refused each time stating "I am going to wait for my people to get here". Patient asked this nurse to call his family and ask them to come be with him. This nurse attempted to call father and daughter but neither answered. Deloria Lair, significant other, successfully notified and stated she would get in touch with patient's family. Patient notified. Will continue to monitor and educate on importance of dressing changes.  Deirdre Pippins, RN

## 2022-05-25 NOTE — Progress Notes (Signed)
PROGRESS NOTE  CHING RABIDEAU SFK:[id number] DOB: 21-Dec-1960 DOA: 05/21/2022 PCP: [name], FNP  Brief History:   61 year old male with a history of COPD, chronic respiratory failure on 4.5 L at home, anxiety/depression, hypertension, pulmonary embolus (03/13/2022 CTA chest) on apixaban, NSCLC diagnosed 03/22/2022 via bronchoscopy (Dr. Elsworth Soho) presenting with worsening shortness of breath over the past 2 to 3 days.  The patient has had numerous hospital admissions with similar presentations.  It appears that the patient has poor social circumstances and poor ability to take care of himself.  The patient was admitted to the hospital from 04/30/2022 to 05/02/2022 secondary to COPD exacerbation.  Once again, he was admitted from 05/06/2022 to 05/08/2022 with a similar presentation.  He was discharged home with prednisone taper. Upon presentation, the patient was tachypneic with oxygen saturation 90% on 6 L.  He was started on IV Solu-Medrol, DuoNebs.  General surgery was consulted for his wounds.    Assessment and Plan: * Acute exacerbation of chronic obstructive pulmonary disease (COPD) ([place]) Currently stable on 5-6 L continue DuoNebs and Brovana Continue Pulmicort Continue IV Solu-Medrol Added Yupelri --aim for oxygen saturation 88-90% Morphine prn sob --he does not need to be on 5-6L as he claims he needs Add hypertonic saline x 2 days  Pressure injury of skin Multiple ulcers, some of which do not appear to be pressure injury related and some appear infected, but without surrounding signs of cellulitis. 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  He is afebrile without leukocytosis, and rules out for sepsis at this time. -MRI thoracic and lumbar spine--neg for osteo, discitis, abscess -appreciate wound care RN consult -Wound care consult for lower extremity ulcers -general surgery eval>>no   need for surgical debridement at  this time.  Continue Santyl as ordered  Decubitus ulcer of back, stage 2 ([place]) As above  History of pulmonary embolus (PE) Restart apixaban  Non-small cell lung cancer ([place]) Seen on CT 03/13/2022  -patient had bronchoscopy 5/26, showed endobronchial mass blocking takeoff of left upper lobe bronchus. 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   Patient has missed several appointments with oncology as outpatient for follow-up on his newly diagnosed lung cancer.   Dr. Delton Coombes was consulted during his recent hospitalization, recommended CAP CT With contrast and MRI brain (neither was suspicious for metastatic disease, though MRI was motion degraded ) for further staging of his large cell cancer, he was to follow-up after scans to discuss plan. --discussed with Dr. Delton Coombes 7/27>>no chemo, needs XRT which Dr. Raliegh Ip will help set up --discussed with Dr. Kenna Gilbert for 10 XRT treatments after d/c  Acute on chronic respiratory failure with hypoxia and hypercapnia ([place]) Chronically on 4 L nasal cannula Initially required 6 L nasal cannula with oxygen saturation 90% Wean oxygen as tolerated Secondary to COPD exacerbation --aim for oxygen saturation 88-92% --morphine prn sob --he does not need to be on 5-6L that he claims he needs Add hypertonic saline  Bipolar disorder ([place]) Resume home meds  Tobacco abuse - Continues to smoke 1-2 packs per week per patient      Family Communication:   tried to call daughter 7/29--unable to leave VM Consultants:  none   Code Status:  FULL    DVT Prophylaxis:  apixaban     Procedures: As Listed in Progress Note Above   Antibiotics: Vanc  7/26>>7/28 Levoflox 7/26>>7/28   RN Pressure Injury Documentation: Pressure Injury 05/06/22 Vertebral column Lower Unstageable - Full thickness tissue loss in which the base of the injury is covered by slough (yellow, tan, gray, green or brown) and/or eschar (tan, brown or black)  in the wound bed. some smaller Unstageab (Active)  05/06/22 1130  Location: Vertebral column  Location Orientation: Lower  Staging: Unstageable - Full thickness tissue loss in which the base of the injury is covered by slough (yellow, tan, gray, green or brown) and/or eschar (tan, brown or black) in the wound bed.  Wound Description (Comments): some smaller Unstageable wounds surround the larger one also  Present on Admission: Yes  Dressing Type Foam - Lift dressing to assess site every shift 05/22/22 1956     Pressure Injury 05/06/22 Sacrum Medial Unstageable - Full thickness tissue loss in which the base of the injury is covered by slough (yellow, tan, gray, green or brown) and/or eschar (tan, brown or black) in the wound bed. (Active)  05/06/22 1130  Location: Sacrum  Location Orientation: Medial  Staging: Unstageable - Full thickness tissue loss in which the base of the injury is covered by slough (yellow, tan, gray, green or brown) and/or eschar (tan, brown or black) in the wound bed.  Wound Description (Comments):   Present on Admission: Yes  Dressing Type Foam - Lift dressing to assess site every shift 05/22/22 1956        Subjective: Patient complains of sob with minimal exertion.  Denies cp, n/v/d, hemoptysis.  Complains he is unable to expectorate sputum  Objective: Vitals:   05/25/22 0540 05/25/22 0817 05/25/22 1129 05/25/22 1251  BP: (!) 142/95   (!) 158/96  Pulse: 72   (!) 108  Resp: (!) 21   19  Temp: 98 F (36.7 C)   98.1 F (36.7 C)  TempSrc: Oral   Oral  SpO2: 100% 98% 93% 93%  Weight:      Height:        Intake/Output Summary (Last 24 hours) at 05/25/2022 1505 Last data filed at 05/25/2022 1300 Gross per 24 hour  Intake 480 ml  Output 1650 ml  Net -1170 ml   Weight change:  Exam:  General:  Pt is alert, follows commands appropriately, not in acute distress HEENT: No icterus, No thrush, No neck mass, [place]/AT Cardiovascular: RRR, S1/S2, no rubs, no  gallops Respiratory: diminished BS.  Bibasilar rales.  Bibasilar rales  Abdomen: Soft/+BS, non tender, non distended, no guarding Extremities: No edema, No lymphangitis, No petechiae, No rashes, no synovitis   Data Reviewed: I have personally reviewed following labs and imaging studies Basic Metabolic Panel: Recent Labs  Lab 05/21/22 1452 05/22/22 0634 05/24/22 0524 05/25/22 0512  NA 140 144  --  140  K 4.1 4.1  --  3.8  CL 100 102  --  95*  CO2 33* 34*  --  39*  GLUCOSE 120* 115*  --  113*  BUN 21* 17  --  14  CREATININE 0.88 0.78 0.55* 0.53*  CALCIUM 9.4 9.0  --  8.9  MG  --   --   --  1.9  PHOS  --   --   --  1.9*   Liver Function Tests: Recent Labs  Lab 05/21/22 1452  AST 18  ALT 19  ALKPHOS 166*  BILITOT 0.5  PROT 6.2*  ALBUMIN 2.7*   No results for input(s): "LIPASE", "AMYLASE" in the last 168 hours. No results for input(s): "  AMMONIA" in the last 168 hours. Coagulation Profile: No results for input(s): "INR", "PROTIME" in the last 168 hours. CBC: Recent Labs  Lab 05/21/22 1452 05/22/22 0634 05/25/22 0512  WBC 9.3 11.5* 15.2*  NEUTROABS 7.9*  --   --   HGB 11.3* 10.4* 10.1*  HCT 37.8* 35.2* 33.8*  MCV 104.7* 105.1* 100.9*  PLT 271 239 243   Cardiac Enzymes: No results for input(s): "CKTOTAL", "CKMB", "CKMBINDEX", "TROPONINI" in the last 168 hours. BNP: Invalid input(s): "POCBNP" CBG: No results for input(s): "GLUCAP" in the last 168 hours. HbA1C: No results for input(s): "HGBA1C" in the last 72 hours. Urine analysis:    Component Value Date/Time   COLORURINE YELLOW 04/29/2022 0803   APPEARANCEUR HAZY (A) 04/29/2022 0803   LABSPEC 1.017 04/29/2022 0803   PHURINE 5.0 04/29/2022 0803   GLUCOSEU NEGATIVE 04/29/2022 0803   HGBUR NEGATIVE 04/29/2022 0803   BILIRUBINUR NEGATIVE 04/29/2022 0803   KETONESUR 20 (A) 04/29/2022 0803   PROTEINUR NEGATIVE 04/29/2022 0803   UROBILINOGEN 1.0 06/12/2015 1021   NITRITE NEGATIVE 04/29/2022 0803    LEUKOCYTESUR NEGATIVE 04/29/2022 0803   Sepsis Labs: @LABRCNTIP (procalcitonin:4,lacticidven:4) ) Recent Results (from the past 240 hour(s))  Resp Panel by RT-PCR (Flu A&B, Covid) Anterior Nasal Swab     Status: None   Collection Time: 05/21/22  4:00 PM   Specimen: Anterior Nasal Swab  Result Value Ref Range Status   SARS Coronavirus 2 by RT PCR NEGATIVE NEGATIVE Final    Comment: (NOTE) SARS-CoV-2 target nucleic acids are NOT DETECTED.  The SARS-CoV-2 RNA is generally detectable in upper respiratory specimens during the acute phase of infection. The lowest concentration of SARS-CoV-2 viral copies this assay can detect is 138 copies/mL. A negative result does not preclude SARS-Cov-2 infection and should not be used as the sole basis for treatment or other patient management decisions. A negative result may occur with  improper specimen collection/handling, submission of specimen other than nasopharyngeal swab, presence of viral mutation(s) within the areas targeted by this assay, and inadequate number of viral copies(<138 copies/mL). A negative result must be combined with clinical observations, patient history, and epidemiological information. The expected result is Negative.  Fact Sheet for Patients:  EntrepreneurPulse.com.au  Fact Sheet for Healthcare Providers:  IncredibleEmployment.be  This test is no t yet approved or cleared by the [place] FDA and  has been authorized for detection and/or diagnosis of SARS-CoV-2 by FDA under an Emergency Use Authorization (EUA). This EUA will remain  in effect (meaning this test can be used) for the duration of the COVID-19 declaration under Section 564(b)(1) of the Act, 21 U.S.C.section 360bbb-3(b)(1), unless the authorization is terminated  or revoked sooner.       Influenza A by PCR NEGATIVE NEGATIVE Final   Influenza B by PCR NEGATIVE NEGATIVE Final    Comment: (NOTE) The Xpert Xpress  SARS-CoV-2/FLU/RSV plus assay is intended as an aid in the diagnosis of influenza from Nasopharyngeal swab specimens and should not be used as a sole basis for treatment. Nasal washings and aspirates are unacceptable for Xpert Xpress SARS-CoV-2/FLU/RSV testing.  Fact Sheet for Patients: EntrepreneurPulse.com.au  Fact Sheet for Healthcare Providers: IncredibleEmployment.be  This test is not yet approved or cleared by the [place] FDA and has been authorized for detection and/or diagnosis of SARS-CoV-2 by FDA under an Emergency Use Authorization (EUA). This EUA will remain in effect (meaning this test can be used) for the duration of the COVID-19 declaration under Section 564(b)(1) of the Act, 21  U.S.C. section 360bbb-3(b)(1), unless the authorization is terminated or revoked.  Performed at [facility], [address] [place], [place] [address]   MRSA Next Gen by PCR, Nasal     Status: None   Collection Time: 05/21/22  8:06 PM   Specimen: Nasal Mucosa; Nasal Swab  Result Value Ref Range Status   MRSA by PCR Next Gen NOT DETECTED NOT DETECTED Final    Comment: (NOTE) The GeneXpert MRSA Assay (FDA approved for NASAL specimens only), is one component of a comprehensive MRSA colonization surveillance program. It is not intended to diagnose MRSA infection nor to guide or monitor treatment for MRSA infections. Test performance is not FDA approved in patients less than 38 years old. Performed at [facility], [address] [place], [place] [address]      Scheduled Meds:  apixaban  5 mg Oral BID   arformoterol  15 mcg Nebulization BID   atorvastatin  40 mg Oral Daily   budesonide (PULMICORT) nebulizer solution  0.5 mg Nebulization BID   collagenase   Topical Daily   dorzolamide  1 drop Left Eye BID   gabapentin  100 mg Oral TID   guaiFENesin  1,200 mg Oral BID   ipratropium-albuterol  3 mL Nebulization TID   latanoprost  1 drop Both Eyes  QHS   methylPREDNISolone (SOLU-MEDROL) injection  60 mg Intravenous Q12H   pantoprazole  40 mg Oral BID   phosphorus  500 mg Oral BID   revefenacin  175 mcg Nebulization Daily   silver sulfADIAZINE   Topical Daily   sodium chloride HYPERTONIC  4 mL Nebulization BID   Continuous Infusions:  Procedures/Studies: MR THORACIC SPINE WO CONTRAST  Result Date: 05/21/2022 CLINICAL DATA:  Initial evaluation for soft tissue ulceration to back. EXAM: MRI THORACIC AND LUMBAR SPINE WITHOUT CONTRAST TECHNIQUE: Multiplanar and multiecho pulse sequences of the thoracic and lumbar spine were obtained without intravenous contrast. COMPARISON:  Prior MRI from 08/26/2019. FINDINGS: MRI THORACIC SPINE FINDINGS Alignment: Physiologic with preservation of the normal thoracic kyphosis. No significant listhesis. Vertebrae: Susceptibility artifact related to prior ACDF noted at C5-6 on counter sequence. Underlying bone marrow signal intensity within normal limits. No worrisome osseous lesions. Multiple acute to subacute compression fractures are seen as follows; 1. T5 compression fracture with up to 50% height loss and trace 2 mm bony retropulsion. 2. T6 compression fracture with up to 35% height loss and trace 2 mm bony retropulsion. 3. T7 compression fracture with 35% height loss without bony retropulsion. 4. T8 compression fracture with up to 50% height loss and 3 mm bony retropulsion. 5. T9 compression fracture with 20% height loss without significant bony retropulsion. 6. T11 compression fracture with mild 35% central height loss without significant bony retropulsion. 7. T12 compression fracture with mild 15% height loss without significant bony retropulsion. Marrow edema involving the left transverse process of T6 likely reflects an acute nondisplaced fracture (a series 18, image 15). Additional chronic T10 compression fracture with mild 30% central height loss without retropulsion noted. No evidence for osteomyelitis  discitis within the thoracic spine. Cord:  Normal signal and morphology. Paraspinal and other soft tissues: Mild diffuse edema noted within the subcutaneous fat of the posterior thoracolumbar spine. No loculated collections. Consolidative opacity at the visualized posterior left lung base, which could reflect atelectasis or infiltrate. Disc levels: No significant disc pathology seen within the thoracic spine for age. Trace bony retropulsion about several of the compression fractures as above. No significant spinal stenosis. Foramina remain  largely patent. MRI LUMBAR SPINE FINDINGS Segmentation: Transitional features seen about the lumbosacral junction with a partially lumbarized S1 segment. Alignment: Physiologic with preservation of the normal lumbar lordosis. No significant listhesis. Vertebrae: Bone marrow signal intensity within normal limits. No discrete or worrisome osseous lesions. Several acute to early subacute compression fractures are seen as follows; 1. Compression fracture involving the superior endplate of L2 with 14% central height loss with trace 2 mm bony retropulsion. 2. Compression fracture involving the L3 vertebral body with mild 10% height loss without significant bony retropulsion. 3. Acute to subacute on chronic compression fracture involving the L4 vertebral body with complete central height loss with trace 2 mm bony retropulsion. 4. Compression fracture involving the superior endplate of L5 with mild 20% height loss without bony retropulsion. 5. Compression fracture involving the superior endplate of S1 without significant height loss or bony retropulsion. Conus medullaris and cauda equina: Conus extends to the L1 level. Conus and cauda equina appear normal. Paraspinal and other soft tissues: Soft tissue defect overlying the spinous processes of L1 through L3, compatible with ulceration (series 2, image 6). Diffuse edema within the underlying subcutaneous fat and paraspinous musculature. No  discrete or loculated collections. No convincing evidence for osteomyelitis discitis or septic arthritis. Visualized visceral structures within normal limits. Disc levels: L1-2: Negative interspace. Mild facet hypertrophy. No significant stenosis. L2-3: Mild disc bulge with trace bony retropulsion. Mild facet hypertrophy. No significant spinal stenosis. Foramina remain patent. L3-4: Disc bulge with disc desiccation. Superimposed small left foraminal disc protrusion closely approximates the exiting left L3 nerve root (series 5, image 16). Mild facet hypertrophy. No significant spinal stenosis. Foramina remain patent. L4-5: Mild disc bulge with disc desiccation and trace bony retropulsion. Mild facet hypertrophy. No significant spinal stenosis. Moderate right greater than left L4 foraminal narrowing. L5-S1: Mild diffuse disc bulge. Moderate facet hypertrophy with associated small joint effusions. No significant spinal stenosis. Moderate right with mild-to-moderate left L5 foraminal narrowing. S1-2: Rudimentary S1-2 interspace. No disc bulge or focal disc herniation. No stenosis. IMPRESSION: 1. Multiple acute to subacute compression fractures involving the T5, T6, T7, T8, T9, T11, T12, L2, L3, L4, L5, and S1 as detailed above. No more than trace bony retropulsion at several levels without significant spinal stenosis. 2. Soft tissue ulceration overlying the lower back at the level of the spinous processes of L1 through L3. Diffuse edema throughout the underlying posterior paraspinous soft tissues without loculated or drainable fluid collection. No convincing evidence for osteomyelitis discitis or septic arthritis within the thoracolumbar spine itself. 3. Mild multilevel lumbar spondylosis without significant spinal stenosis. Moderate bilateral L4 and L5 foraminal narrowing. 4. Patchy consolidative opacity within the posterior left lung base, either atelectasis or infiltrate. Electronically Signed   By: Jeannine Boga M.D.   On: 05/21/2022 20:06   MR LUMBAR SPINE WO CONTRAST  Result Date: 05/21/2022 CLINICAL DATA:  Initial evaluation for soft tissue ulceration to back. EXAM: MRI THORACIC AND LUMBAR SPINE WITHOUT CONTRAST TECHNIQUE: Multiplanar and multiecho pulse sequences of the thoracic and lumbar spine were obtained without intravenous contrast. COMPARISON:  Prior MRI from 08/26/2019. FINDINGS: MRI THORACIC SPINE FINDINGS Alignment: Physiologic with preservation of the normal thoracic kyphosis. No significant listhesis. Vertebrae: Susceptibility artifact related to prior ACDF noted at C5-6 on counter sequence. Underlying bone marrow signal intensity within normal limits. No worrisome osseous lesions. Multiple acute to subacute compression fractures are seen as follows; 1. T5 compression fracture with up to 50% height loss and trace 2 mm  bony retropulsion. 2. T6 compression fracture with up to 35% height loss and trace 2 mm bony retropulsion. 3. T7 compression fracture with 35% height loss without bony retropulsion. 4. T8 compression fracture with up to 50% height loss and 3 mm bony retropulsion. 5. T9 compression fracture with 20% height loss without significant bony retropulsion. 6. T11 compression fracture with mild 35% central height loss without significant bony retropulsion. 7. T12 compression fracture with mild 15% height loss without significant bony retropulsion. Marrow edema involving the left transverse process of T6 likely reflects an acute nondisplaced fracture (a series 18, image 15). Additional chronic T10 compression fracture with mild 30% central height loss without retropulsion noted. No evidence for osteomyelitis discitis within the thoracic spine. Cord:  Normal signal and morphology. Paraspinal and other soft tissues: Mild diffuse edema noted within the subcutaneous fat of the posterior thoracolumbar spine. No loculated collections. Consolidative opacity at the visualized posterior left lung  base, which could reflect atelectasis or infiltrate. Disc levels: No significant disc pathology seen within the thoracic spine for age. Trace bony retropulsion about several of the compression fractures as above. No significant spinal stenosis. Foramina remain largely patent. MRI LUMBAR SPINE FINDINGS Segmentation: Transitional features seen about the lumbosacral junction with a partially lumbarized S1 segment. Alignment: Physiologic with preservation of the normal lumbar lordosis. No significant listhesis. Vertebrae: Bone marrow signal intensity within normal limits. No discrete or worrisome osseous lesions. Several acute to early subacute compression fractures are seen as follows; 1. Compression fracture involving the superior endplate of L2 with 43% central height loss with trace 2 mm bony retropulsion. 2. Compression fracture involving the L3 vertebral body with mild 10% height loss without significant bony retropulsion. 3. Acute to subacute on chronic compression fracture involving the L4 vertebral body with complete central height loss with trace 2 mm bony retropulsion. 4. Compression fracture involving the superior endplate of L5 with mild 20% height loss without bony retropulsion. 5. Compression fracture involving the superior endplate of S1 without significant height loss or bony retropulsion. Conus medullaris and cauda equina: Conus extends to the L1 level. Conus and cauda equina appear normal. Paraspinal and other soft tissues: Soft tissue defect overlying the spinous processes of L1 through L3, compatible with ulceration (series 2, image 6). Diffuse edema within the underlying subcutaneous fat and paraspinous musculature. No discrete or loculated collections. No convincing evidence for osteomyelitis discitis or septic arthritis. Visualized visceral structures within normal limits. Disc levels: L1-2: Negative interspace. Mild facet hypertrophy. No significant stenosis. L2-3: Mild disc bulge with trace  bony retropulsion. Mild facet hypertrophy. No significant spinal stenosis. Foramina remain patent. L3-4: Disc bulge with disc desiccation. Superimposed small left foraminal disc protrusion closely approximates the exiting left L3 nerve root (series 5, image 16). Mild facet hypertrophy. No significant spinal stenosis. Foramina remain patent. L4-5: Mild disc bulge with disc desiccation and trace bony retropulsion. Mild facet hypertrophy. No significant spinal stenosis. Moderate right greater than left L4 foraminal narrowing. L5-S1: Mild diffuse disc bulge. Moderate facet hypertrophy with associated small joint effusions. No significant spinal stenosis. Moderate right with mild-to-moderate left L5 foraminal narrowing. S1-2: Rudimentary S1-2 interspace. No disc bulge or focal disc herniation. No stenosis. IMPRESSION: 1. Multiple acute to subacute compression fractures involving the T5, T6, T7, T8, T9, T11, T12, L2, L3, L4, L5, and S1 as detailed above. No more than trace bony retropulsion at several levels without significant spinal stenosis. 2. Soft tissue ulceration overlying the lower back at the level of the  spinous processes of L1 through L3. Diffuse edema throughout the underlying posterior paraspinous soft tissues without loculated or drainable fluid collection. No convincing evidence for osteomyelitis discitis or septic arthritis within the thoracolumbar spine itself. 3. Mild multilevel lumbar spondylosis without significant spinal stenosis. Moderate bilateral L4 and L5 foraminal narrowing. 4. Patchy consolidative opacity within the posterior left lung base, either atelectasis or infiltrate. Electronically Signed   By: Jeannine Boga M.D.   On: 05/21/2022 20:06   DG Chest Port 1 View  Result Date: 05/21/2022 CLINICAL DATA:  Shortness of breath.  Smoker. EXAM: PORTABLE CHEST 1 VIEW COMPARISON:  05/15/2022 FINDINGS: Normal sized heart. Clear lungs. The lungs remain mildly hyperexpanded. Previously  described callus associated with the right posterior 6th rib, compatible with an old, healed fracture. IMPRESSION: No acute abnormality.  Stable mild changes of COPD. Electronically Signed   By: Claudie Revering M.D.   On: 05/21/2022 15:44   CT Angio Chest PE W and/or Wo Contrast  Result Date: 05/15/2022 CLINICAL DATA:  Hemoptysis since yesterday morning, non-small cell lung cancer EXAM: CT ANGIOGRAPHY CHEST WITH CONTRAST TECHNIQUE: Multidetector CT imaging of the chest was performed using the standard protocol during bolus administration of intravenous contrast. Multiplanar CT image reconstructions and MIPs were obtained to evaluate the vascular anatomy. RADIATION DOSE REDUCTION: This exam was performed according to the departmental dose-optimization program which includes automated exposure control, adjustment of the mA and/or kV according to patient size and/or use of iterative reconstruction technique. CONTRAST:  78mL OMNIPAQUE IOHEXOL 350 MG/ML SOLN COMPARISON:  05/07/2022 FINDINGS: Cardiovascular: This is a technically adequate evaluation of the pulmonary vasculature. There are no filling defects or pulmonary emboli. The heart is unremarkable without pericardial effusion. No evidence of thoracic aortic aneurysm or dissection. Stable atherosclerosis of the aorta and coronary vasculature. Mediastinum/Nodes: No enlarged mediastinal, hilar, or axillary lymph nodes. Thyroid gland, trachea, and esophagus demonstrate no significant findings. Lungs/Pleura: No significant change in the large endobronchial lesion occupying the majority of the left upper lobe bronchial tree. Severe upper lobe predominant emphysema is again noted. No acute airspace disease. Trace left pleural effusion. No pneumothorax. Upper Abdomen: Stable nodular right adrenal thickening measuring 2 x 1 cm, mean attenuation -3 HU, most consistent with adenoma. No acute upper abdominal findings. Musculoskeletal: No acute or destructive bony lesions.  Multiple prior healed bilateral rib fractures. Chronic compression deformities at T5, T7, T8, T11, and T12. Reconstructed images demonstrate no additional findings. Review of the MIP images confirms the above findings. IMPRESSION: 1. No evidence of pulmonary embolus. 2. Stable left upper lobe endobronchial mass consistent with known lung cancer. 3. Trace left pleural effusion. 4. Aortic Atherosclerosis (ICD10-I70.0) and Emphysema (ICD10-J43.9). 5. Stable right adrenal nodule, previously characterized as adenoma. Electronically Signed   By: Randa Ngo M.D.   On: 05/15/2022 18:04   CT Head Wo Contrast  Result Date: 05/15/2022 CLINICAL DATA:  Mental status change. EXAM: CT HEAD WITHOUT CONTRAST TECHNIQUE: Contiguous axial images were obtained from the base of the skull through the vertex without intravenous contrast. RADIATION DOSE REDUCTION: This exam was performed according to the departmental dose-optimization program which includes automated exposure control, adjustment of the mA and/or kV according to patient size and/or use of iterative reconstruction technique. COMPARISON:  None Available. FINDINGS: Brain: No evidence of acute infarction, hemorrhage, hydrocephalus, extra-axial collection or mass lesion/mass effect. Vascular: No hyperdense vessel or unexpected calcification. Skull: Normal. Negative for fracture or focal lesion. Sinuses/Orbits: Minimal polypoid thickening in the maxillary sinuses Other: None. IMPRESSION:  No acute intracranial abnormality. Electronically Signed   By: Fidela Salisbury M.D.   On: 05/15/2022 18:02   DG Chest Portable 1 View  Result Date: 05/15/2022 CLINICAL DATA:  Hemoptysis.  Pain. EXAM: PORTABLE CHEST 1 VIEW COMPARISON:  05/07/2022 CT and 05/06/2022 chest x-ray FINDINGS: Heart size is normal. There is perihilar peribronchial thickening. No new consolidations or pleural effusions. No evidence for pulmonary edema. Remote RIGHT posterior 6th rib fracture. IMPRESSION: No  evidence for acute abnormality. Chronic bronchitic changes. Electronically Signed   By: Nolon Nations M.D.   On: 05/15/2022 13:48   CT CHEST ABDOMEN PELVIS W CONTRAST  Result Date: 05/08/2022 CLINICAL DATA:  Non-small cell lung cancer, acute respiratory failure with hypoxia, failure to thrive, nausea/vomiting EXAM: CT CHEST, ABDOMEN, AND PELVIS WITH CONTRAST TECHNIQUE: Multidetector CT imaging of the chest, abdomen and pelvis was performed following the standard protocol during bolus administration of intravenous contrast. RADIATION DOSE REDUCTION: This exam was performed according to the departmental dose-optimization program which includes automated exposure control, adjustment of the mA and/or kV according to patient size and/or use of iterative reconstruction technique. CONTRAST:  171mL OMNIPAQUE IOHEXOL 300 MG/ML  SOLN COMPARISON:  CT chest dated 03/18/2022. CTA chest abdomen pelvis dated 10/17/2021. FINDINGS: CT CHEST FINDINGS Cardiovascular: No evidence of thoracic aortic aneurysm. Mild atherosclerotic calcifications of the aortic arch. The heart is normal in size.  No pericardial effusion. Coronary atherosclerosis of the LAD and left circumflex. Mediastinum/Nodes: No suspicious mediastinal lymphadenopathy. Visualized thyroid is unremarkable. Lungs/Pleura: Endobronchial soft tissue in the proximal left upper lobe bronchus (series 2/image 34) and extending into the central left upper lobe (series 2/image 28), difficult to discretely measure but measuring approximately 4.9 cm in maximal craniocaudal dimension (coronal image 80) new. This corresponds to the patient's known primary bronchogenic neoplasm and is favored to be mildly progressive. Moderate centrilobular and paraseptal emphysematous changes, upper lung predominant. No focal consolidation. No pleural effusion or pneumothorax. Musculoskeletal: Moderate compression fracture deformity at T5 and T8, grossly unchanged. Mild superior endplate changes  at K93. Cervical spine fixation hardware, incompletely visualized. CT ABDOMEN PELVIS FINDINGS Hepatobiliary: Liver is within normal limits. No suspicious/enhancing hepatic lesions. Gallbladder is underdistended but unremarkable. No intrahepatic or extrahepatic duct dilatation. Pancreas: Within normal limits. Spleen: Within normal limits. Adrenals/Urinary Tract: Stable 2.1 cm right adrenal nodule (series 2/image 68), unchanged from remote priors, favoring a benign adrenal adenoma. Left adrenal gland is within normal limits. Kidneys are notable for probable early excretory contrast in the collecting systems. No hydronephrosis. Excretory contrast in the bladder. Stomach/Bowel: Mildly distended stomach. No evidence of bowel obstruction. Appendix is not discretely visualized. No colonic wall thickening or inflammatory changes. Vascular/Lymphatic: No evidence of abdominal aortic aneurysm. Atherosclerotic calcifications of the abdominal aorta and branch vessels. No suspicious abdominopelvic lymphadenopathy. Reproductive: Prostate is unremarkable. Other: No abdominopelvic ascites. Musculoskeletal: Moderate compression fracture deformity at L4, unchanged. Mild superior endplate compression fracture deformity at L2 and L5, new. Mild superior endplate changes at L1, unchanged. IMPRESSION: Stable central left upper lobe mass, corresponding to the patient's known primary bronchogenic neoplasm. No findings suspicious for metastatic disease. Stable benign right adrenal adenoma. Multiple thoracolumbar compression fracture deformities, with new mild compression fracture deformities at L2 and L5. Aortic Atherosclerosis (ICD10-I70.0) and Emphysema (ICD10-J43.9). Electronically Signed   By: Julian Hy M.D.   On: 05/08/2022 01:08   MR Brain W Wo Contrast  Result Date: 05/07/2022 EXAM: MRI HEAD WITHOUT AND WITH CONTRAST MRA HEAD WITHOUT CONTRAST TECHNIQUE: Multiplanar, multi-echo pulse sequences of the  brain and surrounding  structures were acquired without and with intravenous contrast. Angiographic images of the Circle of Willis were acquired using MRA technique without intravenous contrast. CONTRAST:  76mL GADAVIST GADOBUTROL 1 MMOL/ML IV SOLN COMPARISON:  Prior CT from 10/27/2021. FINDINGS: MRI HEAD FINDINGS Brain: Examination moderately degraded by motion artifact. Cerebral volume within normal limits. Patchy T2/FLAIR hyperintensity involving the periventricular deep white matter both cerebral hemispheres as well as the pons, most consistent with chronic small vessel ischemic disease, mild in nature. No evidence for acute or subacute infarct. Gray-white matter differentiation maintained. No areas of chronic cortical infarction. No acute or chronic intracranial blood products. No mass lesion, midline shift or mass effect no hydrocephalus or extra-axial fluid collection. Pituitary gland suprasellar region normal. No abnormal enhancement. Vascular: Major intracranial vascular flow voids are maintained. Skull and upper cervical spine: Craniocervical junction within normal limits. Bone marrow signal intensity normal. No focal marrow replacing lesion. No scalp soft tissue abnormality. Sinuses/Orbits: Prior bilateral ocular lens replacement. Mild scattered mucosal thickening with a few small retention cysts noted about the ethmoidal air cells and maxillary sinuses. No significant mastoid effusion. Other: None. MRA HEAD FINDINGS Anterior circulation: Examination moderately degraded by motion artifact. Both internal carotid arteries patent to the termini without significant stenosis or other appreciable abnormality. A1 segments patent bilaterally. Grossly normal anterior communicating artery complex. Both ACAs grossly patent to their distal aspects without appreciable stenosis. No visible M1 stenosis or occlusion. No proximal MCA branch occlusion. Distal MCA branches grossly perfused and symmetric. Posterior circulation: Visualized distal V4  segments patent without stenosis. Right vertebral artery dominant. Neither PICA well visualized. Basilar patent to its distal aspect without stenosis. Superior cerebellar and posterior cerebral arteries patent bilaterally with no appreciable significant stenosis. Anatomic variants: None significant. IMPRESSION: MRI HEAD IMPRESSION: 1. Motion artifact degraded exam. 2. No acute intracranial abnormality. No evidence for intracranial metastatic disease. 3. Mild chronic microvascular ischemic disease. MRA HEAD IMPRESSION: 1. Motion degraded exam. 2. Grossly negative intracranial MRA. No large vessel occlusion, hemodynamically significant stenosis, or other acute vascular abnormality. Electronically Signed   By: Jeannine Boga M.D.   On: 05/07/2022 20:36   MR ANGIO HEAD WO CONTRAST  Result Date: 05/07/2022 EXAM: MRI HEAD WITHOUT AND WITH CONTRAST MRA HEAD WITHOUT CONTRAST TECHNIQUE: Multiplanar, multi-echo pulse sequences of the brain and surrounding structures were acquired without and with intravenous contrast. Angiographic images of the Circle of Willis were acquired using MRA technique without intravenous contrast. CONTRAST:  49mL GADAVIST GADOBUTROL 1 MMOL/ML IV SOLN COMPARISON:  Prior CT from 10/27/2021. FINDINGS: MRI HEAD FINDINGS Brain: Examination moderately degraded by motion artifact. Cerebral volume within normal limits. Patchy T2/FLAIR hyperintensity involving the periventricular deep white matter both cerebral hemispheres as well as the pons, most consistent with chronic small vessel ischemic disease, mild in nature. No evidence for acute or subacute infarct. Gray-white matter differentiation maintained. No areas of chronic cortical infarction. No acute or chronic intracranial blood products. No mass lesion, midline shift or mass effect no hydrocephalus or extra-axial fluid collection. Pituitary gland suprasellar region normal. No abnormal enhancement. Vascular: Major intracranial vascular flow  voids are maintained. Skull and upper cervical spine: Craniocervical junction within normal limits. Bone marrow signal intensity normal. No focal marrow replacing lesion. No scalp soft tissue abnormality. Sinuses/Orbits: Prior bilateral ocular lens replacement. Mild scattered mucosal thickening with a few small retention cysts noted about the ethmoidal air cells and maxillary sinuses. No significant mastoid effusion. Other: None. MRA HEAD FINDINGS Anterior circulation: Examination moderately  degraded by motion artifact. Both internal carotid arteries patent to the termini without significant stenosis or other appreciable abnormality. A1 segments patent bilaterally. Grossly normal anterior communicating artery complex. Both ACAs grossly patent to their distal aspects without appreciable stenosis. No visible M1 stenosis or occlusion. No proximal MCA branch occlusion. Distal MCA branches grossly perfused and symmetric. Posterior circulation: Visualized distal V4 segments patent without stenosis. Right vertebral artery dominant. Neither PICA well visualized. Basilar patent to its distal aspect without stenosis. Superior cerebellar and posterior cerebral arteries patent bilaterally with no appreciable significant stenosis. Anatomic variants: None significant. IMPRESSION: MRI HEAD IMPRESSION: 1. Motion artifact degraded exam. 2. No acute intracranial abnormality. No evidence for intracranial metastatic disease. 3. Mild chronic microvascular ischemic disease. MRA HEAD IMPRESSION: 1. Motion degraded exam. 2. Grossly negative intracranial MRA. No large vessel occlusion, hemodynamically significant stenosis, or other acute vascular abnormality. Electronically Signed   By: Jeannine Boga M.D.   On: 05/07/2022 20:36   DG Chest Port 1 View  Result Date: 05/06/2022 CLINICAL DATA:  Shortness of breath EXAM: PORTABLE CHEST 1 VIEW COMPARISON:  04/29/2022 radiograph and 03/18/2022 chest CT FINDINGS: Chronic hyperinflation  and interstitial coarsening. Biapical lucency. Vague rounded density over the right chest correlates with rib fracture callus by CT 2 months ago. Stable appearance of left supra hilum where there is endobronchial mass and airway obstruction by comparison CT. Normal heart size and mediastinal contours. IMPRESSION: COPD without acute superimposed finding. Electronically Signed   By: Jorje Guild M.D.   On: 05/06/2022 04:49   DG Chest Port 1 View  Result Date: 04/29/2022 CLINICAL DATA:  Shortness of breath and hypoxemia. EXAM: PORTABLE CHEST 1 VIEW COMPARISON:  Portable chest 04/22/2022. FINDINGS: The lungs are emphysematous and clear of acute infiltrates. There is scattered linear scarring. Heart size and vasculature are normal with stable mediastinal silhouette. Mild aortic atherosclerosis. Osteopenia. IMPRESSION: Stable COPD chest with no acute cardiopulmonary findings. Electronically Signed   By: Telford Nab M.D.   On: 04/29/2022 03:14    Orson Eva, DO  [facility]  If 7PM-7AM, please contact night-coverage [email or web address] Password [email or web address] 05/25/2022, 3:05 PM   LOS: 4 days

## 2022-05-26 DIAGNOSIS — J9622 Acute and chronic respiratory failure with hypercapnia: Secondary | ICD-10-CM | POA: Diagnosis not present

## 2022-05-26 DIAGNOSIS — J441 Chronic obstructive pulmonary disease with (acute) exacerbation: Secondary | ICD-10-CM | POA: Diagnosis not present

## 2022-05-26 DIAGNOSIS — Z7189 Other specified counseling: Secondary | ICD-10-CM

## 2022-05-26 DIAGNOSIS — C3490 Malignant neoplasm of unspecified part of unspecified bronchus or lung: Secondary | ICD-10-CM | POA: Diagnosis not present

## 2022-05-26 DIAGNOSIS — J9621 Acute and chronic respiratory failure with hypoxia: Secondary | ICD-10-CM | POA: Diagnosis not present

## 2022-05-26 DIAGNOSIS — Z515 Encounter for palliative care: Secondary | ICD-10-CM

## 2022-05-26 MED ORDER — IPRATROPIUM-ALBUTEROL 0.5-2.5 (3) MG/3ML IN SOLN
3.0000 mL | RESPIRATORY_TRACT | Status: DC | PRN
Start: 2022-05-26 — End: 2022-05-29

## 2022-05-26 MED ORDER — REVEFENACIN 175 MCG/3ML IN SOLN
175.0000 ug | Freq: Every day | RESPIRATORY_TRACT | Status: DC | PRN
Start: 2022-05-26 — End: 2022-05-29

## 2022-05-26 NOTE — Progress Notes (Signed)
0847 Patient eating ( refused to have head of bead raised) and at 0911 patient refused breathing treatment stating he was" tired and was going to get a bath. " RT explained it might be helpful to have nebulizer treatment before his bath so he would not get so SOB he still refused stating he would " call if he needed it." He did not appear in respiratory distress at this time SpO2 97% on L/m, BBS diminished with faint base rales.RT will attempt administration of treatment at a later time.

## 2022-05-26 NOTE — Progress Notes (Signed)
Pt offered bath and bed and gown change multiple times throughout the day, education provided on hygiene importance and ordered dressing changes. Pt told me "leave me alone right now for real, I have alot going on".   Lonn Georgia, RN

## 2022-05-26 NOTE — Progress Notes (Signed)
PROGRESS NOTE  Tony Sullivan XTK:[id number] DOB: 09-Mar-1961 DOA: 05/21/2022 PCP: [name], FNP  Brief History:   61 year old male with a history of COPD, chronic respiratory failure on 4.5 L at home, anxiety/depression, hypertension, pulmonary embolus (03/13/2022 CTA chest) on apixaban, NSCLC diagnosed 03/22/2022 via bronchoscopy (Dr. Elsworth Soho) presenting with worsening shortness of breath over the past 2 to 3 days.  The patient has had numerous hospital admissions with similar presentations.  It appears that the patient has poor social circumstances and poor ability to take care of himself.  The patient was admitted to the hospital from 04/30/2022 to 05/02/2022 secondary to COPD exacerbation.  Once again, he was admitted from 05/06/2022 to 05/08/2022 with a similar presentation.  He was discharged home with prednisone taper. Upon presentation, the patient was tachypneic with oxygen saturation 90% on 6 L.  He was started on IV Solu-Medrol, DuoNebs.  General surgery was consulted for his wounds.    Assessment/Plan:   Principal Problem:   Acute exacerbation of chronic obstructive pulmonary disease (COPD) (HCC) Active Problems:   Pressure injury of skin   Tobacco abuse   Bipolar disorder (HCC)   Acute on chronic respiratory failure with hypoxia and hypercapnia (HCC)   Non-small cell lung cancer ([place])   History of pulmonary embolus (PE)   Decubitus ulcer of back, stage 2 (HCC)  Assessment and Plan: * Acute exacerbation of chronic obstructive pulmonary disease (COPD) (HCC) Currently stable on 5-6 L continue DuoNebs and Brovana Continue Pulmicort Continue IV Solu-Medrol Added Yupelri --aim for oxygen saturation 88-90% Morphine prn sob --he does not need to be on 5-6L as he claims he needs Add hypertonic saline x 2 days --pt continues to intermittently refuse nebs and other therapy  Pressure injury of skin Multiple ulcers, some of which do not appear to be pressure injury  related and some appear infected, but without surrounding signs of cellulitis. 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  He is afebrile without leukocytosis, and rules out for sepsis at this time. -MRI thoracic and lumbar spine--neg for osteo, discitis, abscess -appreciate wound care RN consult -Wound care consult for lower extremity ulcers -general surgery eval>>no   need for surgical debridement at this time.  Continue Santyl as ordered  Decubitus ulcer of back, stage 2 ([place]) As above  History of pulmonary embolus (PE) Restart apixaban  Non-small cell lung cancer ([place]) Seen on CT 03/13/2022  -patient had bronchoscopy 5/26, showed endobronchial mass blocking takeoff of left upper lobe bronchus. 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   Patient has missed several appointments with oncology as outpatient for follow-up on his newly diagnosed lung cancer.   Dr. Delton Coombes was consulted during his recent hospitalization, recommended CAP CT With contrast and MRI brain (neither was suspicious for metastatic disease, though MRI was motion degraded ) for further staging of his large cell cancer, he was to follow-up after scans to discuss plan. --discussed with Dr. Delton Coombes 7/27>>no chemo, needs XRT which Dr. Raliegh Ip will help set up --discussed with Dr. Kenna Gilbert for 10 XRT treatments after d/c  Acute on chronic respiratory failure with hypoxia and hypercapnia ([place]) Chronically on 4 L nasal cannula Initially required 6 L nasal cannula with oxygen saturation 90% Wean oxygen as tolerated Secondary to COPD exacerbation --aim for oxygen saturation 88-92% --morphine prn sob --he does not need to be  on 5-6L that he claims he needs Added hypertonic saline --as from prior, he continues to intermittenly refuse nebs and other therapy  Bipolar disorder ([place]) Resume home meds  Tobacco abuse - Continues to smoke 1-2  packs per week per patient       RN Pressure Injury Documentation: Pressure Injury 05/06/22 Vertebral column Lower Unstageable - Full thickness tissue loss in which the base of the injury is covered by slough (yellow, tan, gray, green or brown) and/or eschar (tan, brown or black) in the wound bed. some smaller Unstageab (Active)  05/06/22 1130  Location: Vertebral column  Location Orientation: Lower  Staging: Unstageable - Full thickness tissue loss in which the base of the injury is covered by slough (yellow, tan, gray, green or brown) and/or eschar (tan, brown or black) in the wound bed.  Wound Description (Comments): some smaller Unstageable wounds surround the larger one also  Present on Admission: Yes  Dressing Type Foam - Lift dressing to assess site every shift 05/22/22 1956     Pressure Injury 05/06/22 Sacrum Medial Unstageable - Full thickness tissue loss in which the base of the injury is covered by slough (yellow, tan, gray, green or brown) and/or eschar (tan, brown or black) in the wound bed. (Active)  05/06/22 1130  Location: Sacrum  Location Orientation: Medial  Staging: Unstageable - Full thickness tissue loss in which the base of the injury is covered by slough (yellow, tan, gray, green or brown) and/or eschar (tan, brown or black) in the wound bed.  Wound Description (Comments):   Present on Admission: Yes  Dressing Type Foam - Lift dressing to assess site every shift 05/22/22 1956        Subjective: He continues to complain of sob with minimal exertion.  He denies f/c, cp, n/v/d, abd pain.  He has dry cough  Objective: Vitals:   05/26/22 0410 05/26/22 0911 05/26/22 1336 05/26/22 1619  BP: (!) 148/104  (!) 147/96   Pulse: 84  95   Resp: 18 19 20    Temp: 98.4 F (36.9 C)  98.4 F (36.9 C)   TempSrc: Oral  Oral   SpO2: 100% 97% 100% 98%  Weight:      Height:        Intake/Output Summary (Last 24 hours) at 05/26/2022 1756 Last data filed at 05/26/2022  1430 Gross per 24 hour  Intake 360 ml  Output 1200 ml  Net -840 ml   Weight change:  Exam:  General:  Pt is alert, follows commands appropriately, not in acute distress HEENT: No icterus, No thrush, No neck mass, [place]/AT Cardiovascular: RRR, S1/S2, no rubs, no gallops Respiratory: bibasilar rales.  No wheeze. Diminished BS Abdomen: Soft/+BS, non tender, non distended, no guarding Extremities: No edema, No lymphangitis, No petechiae, No rashes, no synovitis   Data Reviewed: I have personally reviewed following labs and imaging studies Basic Metabolic Panel: Recent Labs  Lab 05/21/22 1452 05/22/22 0634 05/24/22 0524 05/25/22 0512  NA 140 144  --  140  K 4.1 4.1  --  3.8  CL 100 102  --  95*  CO2 33* 34*  --  39*  GLUCOSE 120* 115*  --  113*  BUN 21* 17  --  14  CREATININE 0.88 0.78 0.55* 0.53*  CALCIUM 9.4 9.0  --  8.9  MG  --   --   --  1.9  PHOS  --   --   --  1.9*   Liver Function Tests:  Recent Labs  Lab 05/21/22 1452  AST 18  ALT 19  ALKPHOS 166*  BILITOT 0.5  PROT 6.2*  ALBUMIN 2.7*   No results for input(s): "LIPASE", "AMYLASE" in the last 168 hours. No results for input(s): "AMMONIA" in the last 168 hours. Coagulation Profile: No results for input(s): "INR", "PROTIME" in the last 168 hours. CBC: Recent Labs  Lab 05/21/22 1452 05/22/22 0634 05/25/22 0512  WBC 9.3 11.5* 15.2*  NEUTROABS 7.9*  --   --   HGB 11.3* 10.4* 10.1*  HCT 37.8* 35.2* 33.8*  MCV 104.7* 105.1* 100.9*  PLT 271 239 243   Cardiac Enzymes: No results for input(s): "CKTOTAL", "CKMB", "CKMBINDEX", "TROPONINI" in the last 168 hours. BNP: Invalid input(s): "POCBNP" CBG: No results for input(s): "GLUCAP" in the last 168 hours. HbA1C: No results for input(s): "HGBA1C" in the last 72 hours. Urine analysis:    Component Value Date/Time   COLORURINE YELLOW 04/29/2022 0803   APPEARANCEUR HAZY (A) 04/29/2022 0803   LABSPEC 1.017 04/29/2022 0803   PHURINE 5.0 04/29/2022 0803    GLUCOSEU NEGATIVE 04/29/2022 0803   HGBUR NEGATIVE 04/29/2022 0803   BILIRUBINUR NEGATIVE 04/29/2022 0803   KETONESUR 20 (A) 04/29/2022 0803   PROTEINUR NEGATIVE 04/29/2022 0803   UROBILINOGEN 1.0 06/12/2015 1021   NITRITE NEGATIVE 04/29/2022 0803   LEUKOCYTESUR NEGATIVE 04/29/2022 0803   Sepsis Labs: @LABRCNTIP (procalcitonin:4,lacticidven:4) ) Recent Results (from the past 240 hour(s))  Resp Panel by RT-PCR (Flu A&B, Covid) Anterior Nasal Swab     Status: None   Collection Time: 05/21/22  4:00 PM   Specimen: Anterior Nasal Swab  Result Value Ref Range Status   SARS Coronavirus 2 by RT PCR NEGATIVE NEGATIVE Final    Comment: (NOTE) SARS-CoV-2 target nucleic acids are NOT DETECTED.  The SARS-CoV-2 RNA is generally detectable in upper respiratory specimens during the acute phase of infection. The lowest concentration of SARS-CoV-2 viral copies this assay can detect is 138 copies/mL. A negative result does not preclude SARS-Cov-2 infection and should not be used as the sole basis for treatment or other patient management decisions. A negative result may occur with  improper specimen collection/handling, submission of specimen other than nasopharyngeal swab, presence of viral mutation(s) within the areas targeted by this assay, and inadequate number of viral copies(<138 copies/mL). A negative result must be combined with clinical observations, patient history, and epidemiological information. The expected result is Negative.  Fact Sheet for Patients:  EntrepreneurPulse.com.au  Fact Sheet for Healthcare Providers:  IncredibleEmployment.be  This test is no t yet approved or cleared by the [place] FDA and  has been authorized for detection and/or diagnosis of SARS-CoV-2 by FDA under an Emergency Use Authorization (EUA). This EUA will remain  in effect (meaning this test can be used) for the duration of the COVID-19 declaration under  Section 564(b)(1) of the Act, 21 U.S.C.section 360bbb-3(b)(1), unless the authorization is terminated  or revoked sooner.       Influenza A by PCR NEGATIVE NEGATIVE Final   Influenza B by PCR NEGATIVE NEGATIVE Final    Comment: (NOTE) The Xpert Xpress SARS-CoV-2/FLU/RSV plus assay is intended as an aid in the diagnosis of influenza from Nasopharyngeal swab specimens and should not be used as a sole basis for treatment. Nasal washings and aspirates are unacceptable for Xpert Xpress SARS-CoV-2/FLU/RSV testing.  Fact Sheet for Patients: EntrepreneurPulse.com.au  Fact Sheet for Healthcare Providers: IncredibleEmployment.be  This test is not yet approved or cleared by the [place] FDA and has been  authorized for detection and/or diagnosis of SARS-CoV-2 by FDA under an Emergency Use Authorization (EUA). This EUA will remain in effect (meaning this test can be used) for the duration of the COVID-19 declaration under Section 564(b)(1) of the Act, 21 U.S.C. section 360bbb-3(b)(1), unless the authorization is terminated or revoked.  Performed at [facility], [address], [place], [place] [address]   MRSA Next Gen by PCR, Nasal     Status: None   Collection Time: 05/21/22  8:06 PM   Specimen: Nasal Mucosa; Nasal Swab  Result Value Ref Range Status   MRSA by PCR Next Gen NOT DETECTED NOT DETECTED Final    Comment: (NOTE) The GeneXpert MRSA Assay (FDA approved for NASAL specimens only), is one component of a comprehensive MRSA colonization surveillance program. It is not intended to diagnose MRSA infection nor to guide or monitor treatment for MRSA infections. Test performance is not FDA approved in patients less than 71 years old. Performed at [facility], [address], [place],  [address]      Scheduled Meds:  apixaban  5 mg Oral BID   arformoterol  15 mcg Nebulization BID   atorvastatin  40 mg Oral Daily   budesonide  (PULMICORT) nebulizer solution  0.5 mg Nebulization BID   collagenase   Topical Daily   dorzolamide  1 drop Left Eye BID   gabapentin  100 mg Oral TID   guaiFENesin  1,200 mg Oral BID   ipratropium-albuterol  3 mL Nebulization TID   latanoprost  1 drop Both Eyes QHS   methylPREDNISolone (SOLU-MEDROL) injection  60 mg Intravenous Q12H   pantoprazole  40 mg Oral BID   phosphorus  500 mg Oral BID   revefenacin  175 mcg Nebulization Daily   silver sulfADIAZINE   Topical Daily   sodium chloride HYPERTONIC  4 mL Nebulization BID   Continuous Infusions:  Procedures/Studies: MR THORACIC SPINE WO CONTRAST  Result Date: 05/21/2022 CLINICAL DATA:  Initial evaluation for soft tissue ulceration to back. EXAM: MRI THORACIC AND LUMBAR SPINE WITHOUT CONTRAST TECHNIQUE: Multiplanar and multiecho pulse sequences of the thoracic and lumbar spine were obtained without intravenous contrast. COMPARISON:  Prior MRI from 08/26/2019. FINDINGS: MRI THORACIC SPINE FINDINGS Alignment: Physiologic with preservation of the normal thoracic kyphosis. No significant listhesis. Vertebrae: Susceptibility artifact related to prior ACDF noted at C5-6 on counter sequence. Underlying bone marrow signal intensity within normal limits. No worrisome osseous lesions. Multiple acute to subacute compression fractures are seen as follows; 1. T5 compression fracture with up to 50% height loss and trace 2 mm bony retropulsion. 2. T6 compression fracture with up to 35% height loss and trace 2 mm bony retropulsion. 3. T7 compression fracture with 35% height loss without bony retropulsion. 4. T8 compression fracture with up to 50% height loss and 3 mm bony retropulsion. 5. T9 compression fracture with 20% height loss without significant bony retropulsion. 6. T11 compression fracture with mild 35% central height loss without significant bony retropulsion. 7. T12 compression fracture with mild 15% height loss without significant bony retropulsion.  Marrow edema involving the left transverse process of T6 likely reflects an acute nondisplaced fracture (a series 18, image 15). Additional chronic T10 compression fracture with mild 30% central height loss without retropulsion noted. No evidence for osteomyelitis discitis within the thoracic spine. Cord:  Normal signal and morphology. Paraspinal and other soft tissues: Mild diffuse edema noted within the subcutaneous fat of the posterior thoracolumbar spine. No loculated collections. Consolidative opacity at the  visualized posterior left lung base, which could reflect atelectasis or infiltrate. Disc levels: No significant disc pathology seen within the thoracic spine for age. Trace bony retropulsion about several of the compression fractures as above. No significant spinal stenosis. Foramina remain largely patent. MRI LUMBAR SPINE FINDINGS Segmentation: Transitional features seen about the lumbosacral junction with a partially lumbarized S1 segment. Alignment: Physiologic with preservation of the normal lumbar lordosis. No significant listhesis. Vertebrae: Bone marrow signal intensity within normal limits. No discrete or worrisome osseous lesions. Several acute to early subacute compression fractures are seen as follows; 1. Compression fracture involving the superior endplate of L2 with 43% central height loss with trace 2 mm bony retropulsion. 2. Compression fracture involving the L3 vertebral body with mild 10% height loss without significant bony retropulsion. 3. Acute to subacute on chronic compression fracture involving the L4 vertebral body with complete central height loss with trace 2 mm bony retropulsion. 4. Compression fracture involving the superior endplate of L5 with mild 20% height loss without bony retropulsion. 5. Compression fracture involving the superior endplate of S1 without significant height loss or bony retropulsion. Conus medullaris and cauda equina: Conus extends to the L1 level. Conus and  cauda equina appear normal. Paraspinal and other soft tissues: Soft tissue defect overlying the spinous processes of L1 through L3, compatible with ulceration (series 2, image 6). Diffuse edema within the underlying subcutaneous fat and paraspinous musculature. No discrete or loculated collections. No convincing evidence for osteomyelitis discitis or septic arthritis. Visualized visceral structures within normal limits. Disc levels: L1-2: Negative interspace. Mild facet hypertrophy. No significant stenosis. L2-3: Mild disc bulge with trace bony retropulsion. Mild facet hypertrophy. No significant spinal stenosis. Foramina remain patent. L3-4: Disc bulge with disc desiccation. Superimposed small left foraminal disc protrusion closely approximates the exiting left L3 nerve root (series 5, image 16). Mild facet hypertrophy. No significant spinal stenosis. Foramina remain patent. L4-5: Mild disc bulge with disc desiccation and trace bony retropulsion. Mild facet hypertrophy. No significant spinal stenosis. Moderate right greater than left L4 foraminal narrowing. L5-S1: Mild diffuse disc bulge. Moderate facet hypertrophy with associated small joint effusions. No significant spinal stenosis. Moderate right with mild-to-moderate left L5 foraminal narrowing. S1-2: Rudimentary S1-2 interspace. No disc bulge or focal disc herniation. No stenosis. IMPRESSION: 1. Multiple acute to subacute compression fractures involving the T5, T6, T7, T8, T9, T11, T12, L2, L3, L4, L5, and S1 as detailed above. No more than trace bony retropulsion at several levels without significant spinal stenosis. 2. Soft tissue ulceration overlying the lower back at the level of the spinous processes of L1 through L3. Diffuse edema throughout the underlying posterior paraspinous soft tissues without loculated or drainable fluid collection. No convincing evidence for osteomyelitis discitis or septic arthritis within the thoracolumbar spine itself. 3. Mild  multilevel lumbar spondylosis without significant spinal stenosis. Moderate bilateral L4 and L5 foraminal narrowing. 4. Patchy consolidative opacity within the posterior left lung base, either atelectasis or infiltrate. Electronically Signed   By: Jeannine Boga M.D.   On: 05/21/2022 20:06   MR LUMBAR SPINE WO CONTRAST  Result Date: 05/21/2022 CLINICAL DATA:  Initial evaluation for soft tissue ulceration to back. EXAM: MRI THORACIC AND LUMBAR SPINE WITHOUT CONTRAST TECHNIQUE: Multiplanar and multiecho pulse sequences of the thoracic and lumbar spine were obtained without intravenous contrast. COMPARISON:  Prior MRI from 08/26/2019. FINDINGS: MRI THORACIC SPINE FINDINGS Alignment: Physiologic with preservation of the normal thoracic kyphosis. No significant listhesis. Vertebrae: Susceptibility artifact related to prior ACDF noted  at C5-6 on counter sequence. Underlying bone marrow signal intensity within normal limits. No worrisome osseous lesions. Multiple acute to subacute compression fractures are seen as follows; 1. T5 compression fracture with up to 50% height loss and trace 2 mm bony retropulsion. 2. T6 compression fracture with up to 35% height loss and trace 2 mm bony retropulsion. 3. T7 compression fracture with 35% height loss without bony retropulsion. 4. T8 compression fracture with up to 50% height loss and 3 mm bony retropulsion. 5. T9 compression fracture with 20% height loss without significant bony retropulsion. 6. T11 compression fracture with mild 35% central height loss without significant bony retropulsion. 7. T12 compression fracture with mild 15% height loss without significant bony retropulsion. Marrow edema involving the left transverse process of T6 likely reflects an acute nondisplaced fracture (a series 18, image 15). Additional chronic T10 compression fracture with mild 30% central height loss without retropulsion noted. No evidence for osteomyelitis discitis within the thoracic  spine. Cord:  Normal signal and morphology. Paraspinal and other soft tissues: Mild diffuse edema noted within the subcutaneous fat of the posterior thoracolumbar spine. No loculated collections. Consolidative opacity at the visualized posterior left lung base, which could reflect atelectasis or infiltrate. Disc levels: No significant disc pathology seen within the thoracic spine for age. Trace bony retropulsion about several of the compression fractures as above. No significant spinal stenosis. Foramina remain largely patent. MRI LUMBAR SPINE FINDINGS Segmentation: Transitional features seen about the lumbosacral junction with a partially lumbarized S1 segment. Alignment: Physiologic with preservation of the normal lumbar lordosis. No significant listhesis. Vertebrae: Bone marrow signal intensity within normal limits. No discrete or worrisome osseous lesions. Several acute to early subacute compression fractures are seen as follows; 1. Compression fracture involving the superior endplate of L2 with 70% central height loss with trace 2 mm bony retropulsion. 2. Compression fracture involving the L3 vertebral body with mild 10% height loss without significant bony retropulsion. 3. Acute to subacute on chronic compression fracture involving the L4 vertebral body with complete central height loss with trace 2 mm bony retropulsion. 4. Compression fracture involving the superior endplate of L5 with mild 20% height loss without bony retropulsion. 5. Compression fracture involving the superior endplate of S1 without significant height loss or bony retropulsion. Conus medullaris and cauda equina: Conus extends to the L1 level. Conus and cauda equina appear normal. Paraspinal and other soft tissues: Soft tissue defect overlying the spinous processes of L1 through L3, compatible with ulceration (series 2, image 6). Diffuse edema within the underlying subcutaneous fat and paraspinous musculature. No discrete or loculated  collections. No convincing evidence for osteomyelitis discitis or septic arthritis. Visualized visceral structures within normal limits. Disc levels: L1-2: Negative interspace. Mild facet hypertrophy. No significant stenosis. L2-3: Mild disc bulge with trace bony retropulsion. Mild facet hypertrophy. No significant spinal stenosis. Foramina remain patent. L3-4: Disc bulge with disc desiccation. Superimposed small left foraminal disc protrusion closely approximates the exiting left L3 nerve root (series 5, image 16). Mild facet hypertrophy. No significant spinal stenosis. Foramina remain patent. L4-5: Mild disc bulge with disc desiccation and trace bony retropulsion. Mild facet hypertrophy. No significant spinal stenosis. Moderate right greater than left L4 foraminal narrowing. L5-S1: Mild diffuse disc bulge. Moderate facet hypertrophy with associated small joint effusions. No significant spinal stenosis. Moderate right with mild-to-moderate left L5 foraminal narrowing. S1-2: Rudimentary S1-2 interspace. No disc bulge or focal disc herniation. No stenosis. IMPRESSION: 1. Multiple acute to subacute compression fractures involving the T5,  T6, T7, T8, T9, T11, T12, L2, L3, L4, L5, and S1 as detailed above. No more than trace bony retropulsion at several levels without significant spinal stenosis. 2. Soft tissue ulceration overlying the lower back at the level of the spinous processes of L1 through L3. Diffuse edema throughout the underlying posterior paraspinous soft tissues without loculated or drainable fluid collection. No convincing evidence for osteomyelitis discitis or septic arthritis within the thoracolumbar spine itself. 3. Mild multilevel lumbar spondylosis without significant spinal stenosis. Moderate bilateral L4 and L5 foraminal narrowing. 4. Patchy consolidative opacity within the posterior left lung base, either atelectasis or infiltrate. Electronically Signed   By: Jeannine Boga M.D.   On:  05/21/2022 20:06   DG Chest Port 1 View  Result Date: 05/21/2022 CLINICAL DATA:  Shortness of breath.  Smoker. EXAM: PORTABLE CHEST 1 VIEW COMPARISON:  05/15/2022 FINDINGS: Normal sized heart. Clear lungs. The lungs remain mildly hyperexpanded. Previously described callus associated with the right posterior 6th rib, compatible with an old, healed fracture. IMPRESSION: No acute abnormality.  Stable mild changes of COPD. Electronically Signed   By: Claudie Revering M.D.   On: 05/21/2022 15:44   CT Angio Chest PE W and/or Wo Contrast  Result Date: 05/15/2022 CLINICAL DATA:  Hemoptysis since yesterday morning, non-small cell lung cancer EXAM: CT ANGIOGRAPHY CHEST WITH CONTRAST TECHNIQUE: Multidetector CT imaging of the chest was performed using the standard protocol during bolus administration of intravenous contrast. Multiplanar CT image reconstructions and MIPs were obtained to evaluate the vascular anatomy. RADIATION DOSE REDUCTION: This exam was performed according to the departmental dose-optimization program which includes automated exposure control, adjustment of the mA and/or kV according to patient size and/or use of iterative reconstruction technique. CONTRAST:  22mL OMNIPAQUE IOHEXOL 350 MG/ML SOLN COMPARISON:  05/07/2022 FINDINGS: Cardiovascular: This is a technically adequate evaluation of the pulmonary vasculature. There are no filling defects or pulmonary emboli. The heart is unremarkable without pericardial effusion. No evidence of thoracic aortic aneurysm or dissection. Stable atherosclerosis of the aorta and coronary vasculature. Mediastinum/Nodes: No enlarged mediastinal, hilar, or axillary lymph nodes. Thyroid gland, trachea, and esophagus demonstrate no significant findings. Lungs/Pleura: No significant change in the large endobronchial lesion occupying the majority of the left upper lobe bronchial tree. Severe upper lobe predominant emphysema is again noted. No acute airspace disease. Trace  left pleural effusion. No pneumothorax. Upper Abdomen: Stable nodular right adrenal thickening measuring 2 x 1 cm, mean attenuation -3 HU, most consistent with adenoma. No acute upper abdominal findings. Musculoskeletal: No acute or destructive bony lesions. Multiple prior healed bilateral rib fractures. Chronic compression deformities at T5, T7, T8, T11, and T12. Reconstructed images demonstrate no additional findings. Review of the MIP images confirms the above findings. IMPRESSION: 1. No evidence of pulmonary embolus. 2. Stable left upper lobe endobronchial mass consistent with known lung cancer. 3. Trace left pleural effusion. 4. Aortic Atherosclerosis (ICD10-I70.0) and Emphysema (ICD10-J43.9). 5. Stable right adrenal nodule, previously characterized as adenoma. Electronically Signed   By: Randa Ngo M.D.   On: 05/15/2022 18:04   CT Head Wo Contrast  Result Date: 05/15/2022 CLINICAL DATA:  Mental status change. EXAM: CT HEAD WITHOUT CONTRAST TECHNIQUE: Contiguous axial images were obtained from the base of the skull through the vertex without intravenous contrast. RADIATION DOSE REDUCTION: This exam was performed according to the departmental dose-optimization program which includes automated exposure control, adjustment of the mA and/or kV according to patient size and/or use of iterative reconstruction technique. COMPARISON:  None Available.  FINDINGS: Brain: No evidence of acute infarction, hemorrhage, hydrocephalus, extra-axial collection or mass lesion/mass effect. Vascular: No hyperdense vessel or unexpected calcification. Skull: Normal. Negative for fracture or focal lesion. Sinuses/Orbits: Minimal polypoid thickening in the maxillary sinuses Other: None. IMPRESSION: No acute intracranial abnormality. Electronically Signed   By: Fidela Salisbury M.D.   On: 05/15/2022 18:02   DG Chest Portable 1 View  Result Date: 05/15/2022 CLINICAL DATA:  Hemoptysis.  Pain. EXAM: PORTABLE CHEST 1 VIEW  COMPARISON:  05/07/2022 CT and 05/06/2022 chest x-ray FINDINGS: Heart size is normal. There is perihilar peribronchial thickening. No new consolidations or pleural effusions. No evidence for pulmonary edema. Remote RIGHT posterior 6th rib fracture. IMPRESSION: No evidence for acute abnormality. Chronic bronchitic changes. Electronically Signed   By: Nolon Nations M.D.   On: 05/15/2022 13:48   CT CHEST ABDOMEN PELVIS W CONTRAST  Result Date: 05/08/2022 CLINICAL DATA:  Non-small cell lung cancer, acute respiratory failure with hypoxia, failure to thrive, nausea/vomiting EXAM: CT CHEST, ABDOMEN, AND PELVIS WITH CONTRAST TECHNIQUE: Multidetector CT imaging of the chest, abdomen and pelvis was performed following the standard protocol during bolus administration of intravenous contrast. RADIATION DOSE REDUCTION: This exam was performed according to the departmental dose-optimization program which includes automated exposure control, adjustment of the mA and/or kV according to patient size and/or use of iterative reconstruction technique. CONTRAST:  141mL OMNIPAQUE IOHEXOL 300 MG/ML  SOLN COMPARISON:  CT chest dated 03/18/2022. CTA chest abdomen pelvis dated 10/17/2021. FINDINGS: CT CHEST FINDINGS Cardiovascular: No evidence of thoracic aortic aneurysm. Mild atherosclerotic calcifications of the aortic arch. The heart is normal in size.  No pericardial effusion. Coronary atherosclerosis of the LAD and left circumflex. Mediastinum/Nodes: No suspicious mediastinal lymphadenopathy. Visualized thyroid is unremarkable. Lungs/Pleura: Endobronchial soft tissue in the proximal left upper lobe bronchus (series 2/image 34) and extending into the central left upper lobe (series 2/image 28), difficult to discretely measure but measuring approximately 4.9 cm in maximal craniocaudal dimension (coronal image 80) new. This corresponds to the patient's known primary bronchogenic neoplasm and is favored to be mildly progressive.  Moderate centrilobular and paraseptal emphysematous changes, upper lung predominant. No focal consolidation. No pleural effusion or pneumothorax. Musculoskeletal: Moderate compression fracture deformity at T5 and T8, grossly unchanged. Mild superior endplate changes at G99. Cervical spine fixation hardware, incompletely visualized. CT ABDOMEN PELVIS FINDINGS Hepatobiliary: Liver is within normal limits. No suspicious/enhancing hepatic lesions. Gallbladder is underdistended but unremarkable. No intrahepatic or extrahepatic duct dilatation. Pancreas: Within normal limits. Spleen: Within normal limits. Adrenals/Urinary Tract: Stable 2.1 cm right adrenal nodule (series 2/image 68), unchanged from remote priors, favoring a benign adrenal adenoma. Left adrenal gland is within normal limits. Kidneys are notable for probable early excretory contrast in the collecting systems. No hydronephrosis. Excretory contrast in the bladder. Stomach/Bowel: Mildly distended stomach. No evidence of bowel obstruction. Appendix is not discretely visualized. No colonic wall thickening or inflammatory changes. Vascular/Lymphatic: No evidence of abdominal aortic aneurysm. Atherosclerotic calcifications of the abdominal aorta and branch vessels. No suspicious abdominopelvic lymphadenopathy. Reproductive: Prostate is unremarkable. Other: No abdominopelvic ascites. Musculoskeletal: Moderate compression fracture deformity at L4, unchanged. Mild superior endplate compression fracture deformity at L2 and L5, new. Mild superior endplate changes at L1, unchanged. IMPRESSION: Stable central left upper lobe mass, corresponding to the patient's known primary bronchogenic neoplasm. No findings suspicious for metastatic disease. Stable benign right adrenal adenoma. Multiple thoracolumbar compression fracture deformities, with new mild compression fracture deformities at L2 and L5. Aortic Atherosclerosis (ICD10-I70.0) and Emphysema (ICD10-J43.9).  Electronically  Signed   By: Julian Hy M.D.   On: 05/08/2022 01:08   MR Brain W Wo Contrast  Result Date: 05/07/2022 EXAM: MRI HEAD WITHOUT AND WITH CONTRAST MRA HEAD WITHOUT CONTRAST TECHNIQUE: Multiplanar, multi-echo pulse sequences of the brain and surrounding structures were acquired without and with intravenous contrast. Angiographic images of the Circle of Willis were acquired using MRA technique without intravenous contrast. CONTRAST:  6mL GADAVIST GADOBUTROL 1 MMOL/ML IV SOLN COMPARISON:  Prior CT from 10/27/2021. FINDINGS: MRI HEAD FINDINGS Brain: Examination moderately degraded by motion artifact. Cerebral volume within normal limits. Patchy T2/FLAIR hyperintensity involving the periventricular deep white matter both cerebral hemispheres as well as the pons, most consistent with chronic small vessel ischemic disease, mild in nature. No evidence for acute or subacute infarct. Gray-white matter differentiation maintained. No areas of chronic cortical infarction. No acute or chronic intracranial blood products. No mass lesion, midline shift or mass effect no hydrocephalus or extra-axial fluid collection. Pituitary gland suprasellar region normal. No abnormal enhancement. Vascular: Major intracranial vascular flow voids are maintained. Skull and upper cervical spine: Craniocervical junction within normal limits. Bone marrow signal intensity normal. No focal marrow replacing lesion. No scalp soft tissue abnormality. Sinuses/Orbits: Prior bilateral ocular lens replacement. Mild scattered mucosal thickening with a few small retention cysts noted about the ethmoidal air cells and maxillary sinuses. No significant mastoid effusion. Other: None. MRA HEAD FINDINGS Anterior circulation: Examination moderately degraded by motion artifact. Both internal carotid arteries patent to the termini without significant stenosis or other appreciable abnormality. A1 segments patent bilaterally. Grossly normal anterior  communicating artery complex. Both ACAs grossly patent to their distal aspects without appreciable stenosis. No visible M1 stenosis or occlusion. No proximal MCA branch occlusion. Distal MCA branches grossly perfused and symmetric. Posterior circulation: Visualized distal V4 segments patent without stenosis. Right vertebral artery dominant. Neither PICA well visualized. Basilar patent to its distal aspect without stenosis. Superior cerebellar and posterior cerebral arteries patent bilaterally with no appreciable significant stenosis. Anatomic variants: None significant. IMPRESSION: MRI HEAD IMPRESSION: 1. Motion artifact degraded exam. 2. No acute intracranial abnormality. No evidence for intracranial metastatic disease. 3. Mild chronic microvascular ischemic disease. MRA HEAD IMPRESSION: 1. Motion degraded exam. 2. Grossly negative intracranial MRA. No large vessel occlusion, hemodynamically significant stenosis, or other acute vascular abnormality. Electronically Signed   By: Jeannine Boga M.D.   On: 05/07/2022 20:36   MR ANGIO HEAD WO CONTRAST  Result Date: 05/07/2022 EXAM: MRI HEAD WITHOUT AND WITH CONTRAST MRA HEAD WITHOUT CONTRAST TECHNIQUE: Multiplanar, multi-echo pulse sequences of the brain and surrounding structures were acquired without and with intravenous contrast. Angiographic images of the Circle of Willis were acquired using MRA technique without intravenous contrast. CONTRAST:  64mL GADAVIST GADOBUTROL 1 MMOL/ML IV SOLN COMPARISON:  Prior CT from 10/27/2021. FINDINGS: MRI HEAD FINDINGS Brain: Examination moderately degraded by motion artifact. Cerebral volume within normal limits. Patchy T2/FLAIR hyperintensity involving the periventricular deep white matter both cerebral hemispheres as well as the pons, most consistent with chronic small vessel ischemic disease, mild in nature. No evidence for acute or subacute infarct. Gray-white matter differentiation maintained. No areas of chronic  cortical infarction. No acute or chronic intracranial blood products. No mass lesion, midline shift or mass effect no hydrocephalus or extra-axial fluid collection. Pituitary gland suprasellar region normal. No abnormal enhancement. Vascular: Major intracranial vascular flow voids are maintained. Skull and upper cervical spine: Craniocervical junction within normal limits. Bone marrow signal intensity normal. No focal marrow replacing lesion. No scalp  soft tissue abnormality. Sinuses/Orbits: Prior bilateral ocular lens replacement. Mild scattered mucosal thickening with a few small retention cysts noted about the ethmoidal air cells and maxillary sinuses. No significant mastoid effusion. Other: None. MRA HEAD FINDINGS Anterior circulation: Examination moderately degraded by motion artifact. Both internal carotid arteries patent to the termini without significant stenosis or other appreciable abnormality. A1 segments patent bilaterally. Grossly normal anterior communicating artery complex. Both ACAs grossly patent to their distal aspects without appreciable stenosis. No visible M1 stenosis or occlusion. No proximal MCA branch occlusion. Distal MCA branches grossly perfused and symmetric. Posterior circulation: Visualized distal V4 segments patent without stenosis. Right vertebral artery dominant. Neither PICA well visualized. Basilar patent to its distal aspect without stenosis. Superior cerebellar and posterior cerebral arteries patent bilaterally with no appreciable significant stenosis. Anatomic variants: None significant. IMPRESSION: MRI HEAD IMPRESSION: 1. Motion artifact degraded exam. 2. No acute intracranial abnormality. No evidence for intracranial metastatic disease. 3. Mild chronic microvascular ischemic disease. MRA HEAD IMPRESSION: 1. Motion degraded exam. 2. Grossly negative intracranial MRA. No large vessel occlusion, hemodynamically significant stenosis, or other acute vascular abnormality.  Electronically Signed   By: Jeannine Boga M.D.   On: 05/07/2022 20:36   DG Chest Port 1 View  Result Date: 05/06/2022 CLINICAL DATA:  Shortness of breath EXAM: PORTABLE CHEST 1 VIEW COMPARISON:  04/29/2022 radiograph and 03/18/2022 chest CT FINDINGS: Chronic hyperinflation and interstitial coarsening. Biapical lucency. Vague rounded density over the right chest correlates with rib fracture callus by CT 2 months ago. Stable appearance of left supra hilum where there is endobronchial mass and airway obstruction by comparison CT. Normal heart size and mediastinal contours. IMPRESSION: COPD without acute superimposed finding. Electronically Signed   By: Jorje Guild M.D.   On: 05/06/2022 04:49   DG Chest Port 1 View  Result Date: 04/29/2022 CLINICAL DATA:  Shortness of breath and hypoxemia. EXAM: PORTABLE CHEST 1 VIEW COMPARISON:  Portable chest 04/22/2022. FINDINGS: The lungs are emphysematous and clear of acute infiltrates. There is scattered linear scarring. Heart size and vasculature are normal with stable mediastinal silhouette. Mild aortic atherosclerosis. Osteopenia. IMPRESSION: Stable COPD chest with no acute cardiopulmonary findings. Electronically Signed   By: Telford Nab M.D.   On: 04/29/2022 03:14    Orson Eva, DO  [facility]  If 7PM-7AM, please contact night-coverage [email or web address] Password [email or web address] 05/26/2022, 5:56 PM   LOS: 5 days

## 2022-05-26 NOTE — Progress Notes (Signed)
Patient declined nebulizer at 1420 and asked that I return in an hour. At 1557 pt said he was still on the bedpan.

## 2022-05-26 NOTE — Assessment & Plan Note (Addendum)
7/29 and 7/30  GOC discussed with patient --he confirms continue full scope of care --consult palliative medicine to further the conversation --he is clearly not ready for hospice, DNR, etc presently --7/31 discussed with patient's daughter--she confirms that she and other family are very involved in his care at home and that pt has a personal aide that helps him 3 hours per day

## 2022-05-27 ENCOUNTER — Inpatient Hospital Stay (HOSPITAL_COMMUNITY): Payer: Medicaid Other | Admitting: Hematology

## 2022-05-27 ENCOUNTER — Encounter (HOSPITAL_COMMUNITY): Payer: Self-pay | Admitting: Internal Medicine

## 2022-05-27 DIAGNOSIS — C3490 Malignant neoplasm of unspecified part of unspecified bronchus or lung: Secondary | ICD-10-CM | POA: Diagnosis not present

## 2022-05-27 DIAGNOSIS — J441 Chronic obstructive pulmonary disease with (acute) exacerbation: Secondary | ICD-10-CM | POA: Diagnosis not present

## 2022-05-27 DIAGNOSIS — Z515 Encounter for palliative care: Secondary | ICD-10-CM

## 2022-05-27 DIAGNOSIS — Z7189 Other specified counseling: Secondary | ICD-10-CM

## 2022-05-27 DIAGNOSIS — J9621 Acute and chronic respiratory failure with hypoxia: Secondary | ICD-10-CM | POA: Diagnosis not present

## 2022-05-27 DIAGNOSIS — L89102 Pressure ulcer of unspecified part of back, stage 2: Secondary | ICD-10-CM | POA: Diagnosis not present

## 2022-05-27 DIAGNOSIS — J9622 Acute and chronic respiratory failure with hypercapnia: Secondary | ICD-10-CM | POA: Diagnosis not present

## 2022-05-27 LAB — BASIC METABOLIC PANEL
Anion gap: 9 (ref 5–15)
BUN: 13 mg/dL (ref 6–20)
CO2: 40 mmol/L — ABNORMAL HIGH (ref 22–32)
Calcium: 8.7 mg/dL — ABNORMAL LOW (ref 8.9–10.3)
Chloride: 93 mmol/L — ABNORMAL LOW (ref 98–111)
Creatinine, Ser: 0.5 mg/dL — ABNORMAL LOW (ref 0.61–1.24)
GFR, Estimated: >60 mL/min (ref >60)
Glucose, Bld: 104 mg/dL — ABNORMAL HIGH (ref 70–99)
Potassium: 3.5 mmol/L (ref 3.5–5.1)
Sodium: 142 mmol/L (ref 135–145)

## 2022-05-27 LAB — MAGNESIUM: Magnesium: 2 mg/dL (ref 1.7–2.4)

## 2022-05-27 LAB — PHOSPHORUS: Phosphorus: 2.9 mg/dL (ref 2.5–4.6)

## 2022-05-27 MED ORDER — MORPHINE SULFATE (PF) 2 MG/ML IV SOLN
2.0000 mg | INTRAVENOUS | Status: DC | PRN
  Administered 2022-05-27 – 2022-05-28 (×11): 2 mg via INTRAVENOUS
  Filled 2022-05-27 (×11): qty 1

## 2022-05-27 NOTE — TOC Progression Note (Signed)
Transition of Care [facility]) - Progression Note    Patient Details  Name: DONIVIN WIRT MRN: [id number] Date of Birth: 12-09-1960  Transition of [facility] CM/SW Contact  Ihor Gully, LCSW Phone Number: 05/27/2022, 4:24 PM  Clinical Narrative:    Spoke with patient's daughter, [name]. She advised that patient is not homeless. Patient does not live alone. He has a friend who lives with him and helps him out in the home. He has a PCS aide 80 hours per month. He wants CAP services however he does not have a PCP to sign off on the orders. Patient has previously been provided PCP list. [name] states that patient frequently misses medical appointments because he will not get up for early morning appointments despite her telling him that he has to get up for appointments. Per daughter, patient has not been being forthcoming during this hospitalization. She states that patient gets upset when he is discharged from the hospital.  Discussed appointment with wound care center. [name] stated that they have already contacted her and that she advised that she would contact them in return when he is discharged. Discussed that patient will likely be d/c tomorrow.    Expected Discharge Plan: [place] Barriers to Discharge: Continued Medical Work up  Expected Discharge Plan and Services Expected Discharge Plan: [place]                                               Social Determinants of Health (SDOH) Interventions    Readmission Risk Interventions    05/06/2022   11:49 AM 05/01/2022    7:51 AM 03/14/2022    3:54 PM  Readmission Risk Prevention Plan  Transportation Screening Complete Complete Complete  Medication Review [employer] photographer) Complete Complete Complete  PCP or Specialist appointment within 3-5 days of discharge   Not Complete  HRI or [place] Complete Complete Complete  SW Recovery Care/Counseling Consult Complete Complete  Complete  Palliative Care Screening Not Applicable Not Applicable Not [employer] Not Applicable Not Applicable Patient Refused

## 2022-05-27 NOTE — Progress Notes (Signed)
Patient refusing morning dressing changes, bath and linen change. Patient stated he "would try to do it later." Will try again this afternoon.

## 2022-05-27 NOTE — Progress Notes (Signed)
Patient requested security bring up his wallet, paper retrieved 1 [employer] debit card and sent the rest back with security.

## 2022-05-27 NOTE — Consult Note (Signed)
Consultation Note Date: 05/27/2022   Patient Name: Tony Sullivan  DOB: 16-Jun-1961  MRN: [id number]  Age / Sex: 61 y.o., male  PCP: [name], FNP Referring Physician: Orson Eva, MD  Reason for Consultation: Establishing goals of care  HPI/Patient Profile: 61 y.o. 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   Clinical Assessment and Goals of Care: I have reviewed medical records including EPIC notes, labs and imaging, received report from RN, assessed the patient.  Mr. Zingg is lying quietly in bed.  He greets me, making but not keeping eye contact.  He appears acutely/chronically ill and frail.  He is alert and oriented x3, able to make his needs known.  His longtime male friend Mardene Celeste and his brother Rush Landmark are present.  We meet at bedside to discuss diagnosis prognosis, GOC, EOL wishes, disposition and options.  I introduced Palliative Medicine as specialized medical care for people living with serious illness. It focuses on providing relief from the symptoms and stress of a serious illness. The goal is to improve quality of life for both the patient and the family.  We focused on their current illness.  We talk about his respiratory issues and the treatment plan.  Mr. Mangione is knowledgeable about his health concerns, but not compliant with the treatment plan and/or recommendations.  The natural disease trajectory and expectations at EOL were discussed.  Advanced directives, concepts specific to code status, artifical feeding and hydration,  and rehospitalization were considered and discussed.  Mr. Tadros remains full scope/full code.  Palliative Care services outpatient were discussed.  Mr. Ditullio is active with [facility] for outpatient palliative care.  Discussed the importance of continued conversation with family and the medical providers regarding overall plan of care and treatment options, ensuring decisions are within the context of the patient's values and GOCs. Questions and concerns were addressed. The patient was encouraged to call with questions or concerns.  PMT will continue to support holistically.  Conference with attending, bedside nursing staff, transition of care team [facility] inpatient rep related to patient condition, needs, goals of care, disposition.   HCPOA NEXT OF KIN -daughter, [name].    SUMMARY OF RECOMMENDATIONS   Continue full scope/full code Time for outcomes We will rehospitalize as needed Active with [facility] for outpatient palliative   Code Status/Advance Care Planning: Full code  Symptom Management:  Per hospitalist, no additional needs at this time.  Palliative Prophylaxis:  Frequent Pain Assessment and Oral Care  Additional Recommendations (Limitations, Scope, Preferences): Full Scope Treatment  Psycho-social/Spiritual:  Desire for further Chaplaincy support:no Additional Recommendations: Caregiving  Support/Resources and Education on Hospice  Prognosis:  < 6 months, or less would not be  surprising based on chronic illness burden, 10 hospital admissions and 22 ED visits in the last 6 months.  Discharge Planning:  Anticipate home, active with [facility] outpatient palliative care       Primary Diagnoses: Present on Admission:  Non-small cell lung cancer ([place])  History of pulmonary embolus (PE)  Acute exacerbation of chronic obstructive pulmonary disease (COPD) (HCC)  Pressure injury of skin  Acute on chronic respiratory failure with hypoxia and hypercapnia (HCC)  Bipolar disorder  ([place])  Tobacco abuse   I have reviewed the medical record, interviewed the patient and family, and examined the patient. The following aspects are pertinent.  Past Medical History:  Diagnosis Date   Anxiety    Arthritis    COPD (chronic obstructive pulmonary disease) ([place]) 2004   diagnosed in 2004, no PFT's to date.  Started on home O2 12/2013, after found to be desatting at PCP's office, and referred to pulmonology.   Depression    GERD (gastroesophageal reflux disease)    High blood pressure    Prediabetes    Seasonal allergies    Substance abuse (HCC)    Tobacco dependence    Social History   Socioeconomic History   Marital status: Single    Spouse name: Not on file   Number of children: 6   Years of education: Not on file   Highest education level: Not on file  Occupational History   Occupation: disabled  Tobacco Use   Smoking status: Some Days    Packs/day: 0.25    Years: 36.00    Total pack years: 9.00    Types: Cigarettes    Last attempt to quit: 10/2021    Years since quitting: 0.5   Smokeless tobacco: Never  Vaping Use   Vaping Use: Never used  Substance and Sexual Activity   Alcohol use: Not Currently    Alcohol/week: 14.0 - 21.0 standard drinks of alcohol    Types: 14 - 21 Standard drinks or equivalent per week    Comment: 2-3 beers/day, with occasional "binges".  No history of withdrawal   Drug use: Yes    Types: Marijuana, Cocaine    Comment: last cocaine February 2023   Sexual activity: Not on file  Other Topics Concern   Not on file  Social History Narrative   Not on file   Social Determinants of Health   Financial Resource Strain: Not on file  Food Insecurity: Not on file  Transportation Needs: Unmet Transportation Needs (04/12/2022)   PRAPARE - Hydrologist (Medical): Not on file    Lack of Transportation (Non-Medical): Yes  Physical Activity: Inactive (04/12/2022)   Exercise Vital Sign    Days of Exercise per  Week: 0 days    Minutes of Exercise per Session: 0 min  Stress: Not on file  Social Connections: Not on file   Family History  Problem Relation Age of Onset   Emphysema Mother    Allergies Mother        "everyone in family"   Asthma Mother        "everyone in family"   Heart disease Mother    Clotting disorder Mother    Cancer Mother    Emphysema Father    Allergies Father    Allergies Son    Seizures Brother    Diabetes Brother    Scheduled Meds:  apixaban  5 mg Oral BID   arformoterol  15 mcg Nebulization BID   atorvastatin  40 mg Oral Daily   budesonide (PULMICORT) nebulizer solution  0.5 mg Nebulization BID   collagenase   Topical Daily   dorzolamide  1 drop Left Eye BID   gabapentin  100 mg Oral TID   guaiFENesin  1,200 mg Oral BID   latanoprost  1 drop Both Eyes QHS   methylPREDNISolone (SOLU-MEDROL) injection  60 mg Intravenous Q12H   pantoprazole  40 mg Oral BID   phosphorus  500 mg Oral BID   silver sulfADIAZINE   Topical Daily   Continuous Infusions: PRN Meds:.acetaminophen **OR** acetaminophen, albuterol, ipratropium-albuterol, morphine injection, polyethylene glycol, revefenacin Medications Prior to Admission:  Prior to Admission medications   Medication Sig Start Date End Date Taking? Authorizing Provider  acetaminophen (TYLENOL) 325 MG tablet Take 2 tablets (650 mg total) by mouth every 6 (six) hours as needed for mild pain (or Fever >/= 101). 10/30/21  Yes Emokpae, Courage, MD  albuterol (PROVENTIL) (2.5 MG/3ML) 0.083% nebulizer solution Take 3 mLs (2.5 mg total) by nebulization every 4 (four) hours as needed for wheezing or shortness of breath. 05/08/22 05/08/23 Yes Emokpae, Courage, MD  albuterol (VENTOLIN HFA) 108 (90 Base) MCG/ACT inhaler Inhale 2 puffs into the lungs every 6 (six) hours as needed for wheezing or shortness of breath. 05/08/22  Yes Emokpae, Courage, MD  apixaban (ELIQUIS) 5 MG TABS tablet Take 1 tablet (5 mg total) by mouth 2 (two) times  daily. 05/08/22 08/06/22 Yes Emokpae, Courage, MD  atorvastatin (LIPITOR) 40 MG tablet TAKE ONE TABLET BY MOUTH DAILY Patient taking differently: Take 40 mg by mouth daily. 07/06/20  Yes [name], MD  bacitracin ointment Apply 1 Application topically 2 (two) times daily. 05/15/22  Yes Idol, Almyra Free, PA-C  benztropine (COGENTIN) 0.5 MG tablet Take 0.5 mg by mouth at bedtime.   Yes [provider]  Budeson-Glycopyrrol-Formoterol (BREZTRI AEROSPHERE) 160-9-4.8 MCG/ACT AERO Inhale 2 puffs into the lungs in the morning and at bedtime. 05/08/22  Yes Emokpae, Courage, MD  dextromethorphan-guaiFENesin (MUCINEX DM) 30-600 MG 12hr tablet Take 1 tablet by mouth 2 (two) times daily. 05/02/22  Yes Barton Dubois, MD  diclofenac Sodium (VOLTAREN) 1 % GEL Apply 2 g topically 4 (four) times daily as needed (pain).   Yes [provider]  dorzolamide (TRUSOPT) 2 % ophthalmic solution Place 1 drop into the left eye 2 (two) times daily. 12/12/20  Yes [provider]  doxycycline (VIBRAMYCIN) 100 MG capsule Take 1 capsule (100 mg total) by mouth 2 (two) times daily. 05/15/22  Yes Idol, Almyra Free, PA-C  furosemide (LASIX) 20 MG tablet Take 1 tablet (20 mg total) by mouth daily. 05/08/22  Yes Emokpae, Courage, MD  gabapentin (NEURONTIN) 100 MG capsule Take 1 capsule (100 mg total) by mouth 3 (three) times daily. 05/08/22 07/07/22 Yes Emokpae, Courage, MD  hydrOXYzine (ATARAX) 25 MG tablet Take 1 tablet (25 mg total) by mouth 3 (three) times daily as needed for anxiety. 05/08/22  Yes Emokpae, Courage, MD  latanoprost (XALATAN) 0.005 % ophthalmic solution Place 1 drop into both eyes at bedtime. 05/24/19  Yes [provider]  loxapine (LOXITANE) 10 MG capsule Take 1 capsule (10 mg total) by mouth at bedtime. 05/08/22  Yes Emokpae, Courage, MD  mirtazapine (REMERON) 30 MG tablet Take 1 tablet (30 mg total) by mouth at bedtime. 05/08/22  Yes [name], MD  Multiple Vitamin (MULTIVITAMIN WITH  MINERALS) TABS tablet Take 1 tablet by mouth daily. 05/08/22  Yes Emokpae, Courage, MD  pantoprazole (PROTONIX) 40 MG tablet  Take 1 tablet (40 mg total) by mouth 2 (two) times daily. 05/08/22  Yes Emokpae, Courage, MD  polyethylene glycol (MIRALAX / GLYCOLAX) 17 g packet Take 17 g by mouth daily. 05/08/22  Yes Emokpae, Courage, MD  potassium chloride SA (KLOR-CON M) 20 MEQ tablet Take 1 tablet (20 mEq total) by mouth daily. 05/08/22  Yes Emokpae, Courage, MD  senna-docusate (SENOKOT-S) 8.6-50 MG tablet Take 2 tablets by mouth at bedtime. Patient taking differently: Take 2 tablets by mouth at bedtime as needed for mild constipation. 10/30/21  Yes [name], MD  simethicone (MYLICON) 80 MG chewable tablet Chew 1 tablet (80 mg total) by mouth 4 (four) times daily as needed for flatulence. Patient taking differently: Chew 80 mg by mouth every 6 (six) hours as needed for flatulence. 03/15/22  Yes Shah, Pratik D, DO  OXYGEN Inhale 4-5 L into the lungs as directed. 4-5 liters with sleep and exertion as needed for low oxygen    [provider]   Allergies  Allergen Reactions   Penicillins Anaphylaxis    Has patient had a PCN reaction causing immediate rash, facial/tongue/throat swelling, SOB or lightheadedness with hypotension: Yes Has patient had a PCN reaction causing severe rash involving mucus membranes or skin necrosis: No Has patient had a PCN reaction that required hospitalization No Has patient had a PCN reaction occurring within the last 10 years: No If all of the above answers are "NO", then may proceed with Cephalosporin use.    Levocetirizine Other (See Comments)    Muscle cramps   Review of Systems  Unable to perform ROS: Other    Physical Exam Vitals and nursing note reviewed.  Constitutional:      General: He is not in acute distress.    Appearance: He is ill-appearing.  Cardiovascular:     Rate and Rhythm: Normal rate.  Pulmonary:     Effort: Pulmonary effort is  normal.  Skin:    General: Skin is warm and dry.  Neurological:     Mental Status: He is alert and oriented to person, place, and time.     Vital Signs: BP (!) 149/96 (BP Location: Right Arm)   Pulse 90   Temp 98.2 F (36.8 C) (Oral)   Resp 20   Ht 5\' 11"  (1.803 m)   Wt 58.8 kg   SpO2 100%   BMI 18.08 kg/m  Pain Scale: 0-10 POSS *See Group Information*: S-Acceptable,Sleep, easy to arouse Pain Score: 6    SpO2: SpO2: 100 % O2 Device:SpO2: 100 % O2 Flow Rate: .O2 Flow Rate (L/min): 5 L/min  IO: Intake/output summary:  Intake/Output Summary (Last 24 hours) at 05/27/2022 1322 Last data filed at 05/27/2022 0900 Gross per 24 hour  Intake 360 ml  Output 500 ml  Net -140 ml    LBM: Last BM Date : 05/23/22 Baseline Weight: Weight: 61.3 kg Most recent weight: Weight: 58.8 kg     Palliative Assessment/Data:   Flowsheet Rows    Flowsheet Row Most Recent Value  Intake Tab   Referral Department Hospitalist  Unit at Time of Referral Cardiac/Telemetry Unit  Palliative Care Primary Diagnosis Pulmonary  Date Notified 05/26/22  Palliative Care Type Return patient Palliative Care  Reason for referral Clarify Goals of Care  Date of Admission 05/21/22  Date first seen by Palliative Care 05/27/22  # of days Palliative referral response time 1 Day(s)  # of days IP prior to Palliative referral 5  Clinical Assessment   Palliative Performance Scale  Score 40%  Pain Max last 24 hours Not able to report  Pain Min Last 24 hours Not able to report  Dyspnea Max Last 24 Hours Not able to report  Dyspnea Min Last 24 hours Not able to report  Psychosocial & Spiritual Assessment   Palliative Care Outcomes        Time In: 1430 Time Out: 1510 Time Total: 40 minutes  Greater than 50%  of this time was spent counseling and coordinating care related to the above assessment and plan.  Signed by: Drue Novel, NP   Please contact Palliative Medicine Team phone at [phone] for questions  and concerns.  For individual provider: See Shea Evans

## 2022-05-27 NOTE — Progress Notes (Signed)
Patient allowed nurse to change dressing to mid back. Refused sacral dressing change. Also refused lined change and bath. States he refuses because he does not want to be moved because he can not breathe when he moves,

## 2022-05-27 NOTE — Progress Notes (Signed)
Patient agreed to changing the dressings on his legs, however I did have to allow breaks in between. Patient had to be talked into changing him after having a bowel movement which also took time and patient had to take breaks to catch his breath.

## 2022-05-27 NOTE — Progress Notes (Signed)
PROGRESS NOTE  Tony REIERSON JSH:[id number] DOB: 07-13-1961 DOA: 05/21/2022 PCP: [name], FNP  Brief History:   61 year old male with a history of COPD, chronic respiratory failure on 4.5 L at home, anxiety/depression, hypertension, pulmonary embolus (03/13/2022 CTA chest) on apixaban, NSCLC diagnosed 03/22/2022 via bronchoscopy (Dr. Elsworth Soho) presenting with worsening shortness of breath over the past 2 to 3 days.  The patient has had numerous hospital admissions with similar presentations.  It appears that the patient has poor social circumstances and poor ability to take care of himself.  The patient was admitted to the hospital from 04/30/2022 to 05/02/2022 secondary to COPD exacerbation.  Once again, he was admitted from 05/06/2022 to 05/08/2022 with a similar presentation.  He was discharged home with prednisone taper. Upon presentation, the patient was tachypneic with oxygen saturation 90% on 6 L.  He was started on IV Solu-Medrol, DuoNebs.  General surgery was consulted for his wounds.    Assessment/Plan:   Principal Problem:   Acute exacerbation of chronic obstructive pulmonary disease (COPD) (HCC) Active Problems:   Pressure injury of skin   Tobacco abuse   Bipolar disorder (HCC)   Acute on chronic respiratory failure with hypoxia and hypercapnia (HCC)   Non-small cell lung cancer ([place])   History of pulmonary embolus (PE)   Decubitus ulcer of back, stage 2 (HCC)   Goals of care, counseling/discussion  Assessment and Plan: * Acute exacerbation of chronic obstructive pulmonary disease (COPD) ([place]) Currently stable on 5-6 L continue DuoNebs and Brovana Continue Pulmicort Continue IV Solu-Medrol Added Yupelri --aim for oxygen saturation 88-90% Morphine prn sob --he does not need to be on 5-6L as he claims he needs Add hypertonic saline x 2 days --pt continues to intermittently refuse nebs and other therapy  Pressure injury of skin Multiple ulcers, some of which  do not appear to be pressure injury related and some appear infected, but without surrounding signs of cellulitis. 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  He is afebrile without leukocytosis, and rules out for sepsis at this time. -MRI thoracic and lumbar spine--neg for osteo, discitis, abscess -appreciate wound care RN consult -Wound care consult for lower extremity ulcers -general surgery eval>>no   need for surgical debridement at this time.  Continue Santyl as ordered  Goals of care, counseling/discussion 7/29 and 7/30  GOC discussed with patient --he confirms continue full scope of care --consult palliative medicine to further the conversation --he is clearly not ready for hospice, DNR, etc presently  Decubitus ulcer of back, stage 2 ([place]) As above  History of pulmonary embolus (PE) Restart apixaban  Non-small cell lung cancer ([place]) Seen on CT 03/13/2022  -patient had bronchoscopy 5/26, showed endobronchial mass blocking takeoff of left upper lobe bronchus. 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   Patient has missed several appointments with oncology as outpatient for follow-up on his newly diagnosed lung cancer.   Dr. Delton Coombes was consulted during his recent hospitalization, recommended CAP CT With contrast and MRI brain (neither was suspicious for metastatic disease, though MRI was motion degraded ) for further staging of his large cell cancer, he was to follow-up after scans to discuss plan. --discussed with Dr. Delton Coombes 7/27>>no chemo, needs XRT which Dr. Raliegh Ip will help set up --discussed with Dr. Kenna Gilbert for 10 XRT treatments after d/c  Acute on chronic respiratory failure  with hypoxia and hypercapnia (HCC) Chronically on 4 L nasal cannula Initially required 6 L nasal cannula with oxygen saturation 90% Wean oxygen as tolerated Secondary to COPD exacerbation --aim for oxygen  saturation 88-92% --morphine prn sob --he does not need to be on 5-6L that he claims he needs Added hypertonic saline --as from prior, he continues to intermittenly refuse nebs and other therapy  Bipolar disorder ([place]) Resume home meds  Tobacco abuse - Continues to smoke 1-2 packs per week per patient          Family Communication:  daughter updated 7/31  Consultants:  palliative  Code Status:  FULL   DVT Prophylaxis:  apixaban   Procedures: As Listed in Progress Note Above  Antibiotics: None  RN Pressure Injury Documentation: Pressure Injury 05/06/22 Vertebral column Lower Unstageable - Full thickness tissue loss in which the base of the injury is covered by slough (yellow, tan, gray, green or brown) and/or eschar (tan, brown or black) in the wound bed. some smaller Unstageab (Active)  05/06/22 1130  Location: Vertebral column  Location Orientation: Lower  Staging: Unstageable - Full thickness tissue loss in which the base of the injury is covered by slough (yellow, tan, gray, green or brown) and/or eschar (tan, brown or black) in the wound bed.  Wound Description (Comments): some smaller Unstageable wounds surround the larger one also  Present on Admission: Yes  Dressing Type Foam - Lift dressing to assess site every shift 05/22/22 1956     Pressure Injury 05/06/22 Sacrum Medial Unstageable - Full thickness tissue loss in which the base of the injury is covered by slough (yellow, tan, gray, green or brown) and/or eschar (tan, brown or black) in the wound bed. (Active)  05/06/22 1130  Location: Sacrum  Location Orientation: Medial  Staging: Unstageable - Full thickness tissue loss in which the base of the injury is covered by slough (yellow, tan, gray, green or brown) and/or eschar (tan, brown or black) in the wound bed.  Wound Description (Comments):   Present on Admission: Yes  Dressing Type Foam - Lift dressing to assess site every shift 05/22/22 1956         Subjective:   Objective: Vitals:   05/26/22 2100 05/27/22 0519 05/27/22 0854 05/27/22 1220  BP: (!) 161/97 (!) 158/99  (!) 149/96  Pulse: 94 84  90  Resp: 18 19  20   Temp: 98.7 F (37.1 C) 98.1 F (36.7 C)  98.2 F (36.8 C)  TempSrc: Oral Oral  Oral  SpO2: 94% 100% 99% 100%  Weight:      Height:        Intake/Output Summary (Last 24 hours) at 05/27/2022 1822 Last data filed at 05/27/2022 1500 Gross per 24 hour  Intake 480 ml  Output 950 ml  Net -470 ml   Weight change:  Exam:  General:  Pt is alert, follows commands appropriately, not in acute distress HEENT: No icterus, No thrush, No neck mass, [place]/AT Cardiovascular: RRR, S1/S2, no rubs, no gallops Respiratory: diminished BS.  Bibasilar rales. No wheeze Abdomen: Soft/+BS, non tender, non distended, no guarding Extremities: No edema, No lymphangitis, No petechiae, No rashes, no synovitis   Data Reviewed: I have personally reviewed following labs and imaging studies Basic Metabolic Panel: Recent Labs  Lab 05/21/22 1452 05/22/22 0634 05/24/22 0524 05/25/22 0512 05/27/22 0527  NA 140 144  --  140 142  K 4.1 4.1  --  3.8 3.5  CL 100 102  --  95*  93*  CO2 33* 34*  --  39* 40*  GLUCOSE 120* 115*  --  113* 104*  BUN 21* 17  --  14 13  CREATININE 0.88 0.78 0.55* 0.53* 0.50*  CALCIUM 9.4 9.0  --  8.9 8.7*  MG  --   --   --  1.9 2.0  PHOS  --   --   --  1.9* 2.9   Liver Function Tests: Recent Labs  Lab 05/21/22 1452  AST 18  ALT 19  ALKPHOS 166*  BILITOT 0.5  PROT 6.2*  ALBUMIN 2.7*   No results for input(s): "LIPASE", "AMYLASE" in the last 168 hours. No results for input(s): "AMMONIA" in the last 168 hours. Coagulation Profile: No results for input(s): "INR", "PROTIME" in the last 168 hours. CBC: Recent Labs  Lab 05/21/22 1452 05/22/22 0634 05/25/22 0512  WBC 9.3 11.5* 15.2*  NEUTROABS 7.9*  --   --   HGB 11.3* 10.4* 10.1*  HCT 37.8* 35.2* 33.8*  MCV 104.7* 105.1* 100.9*  PLT 271  239 243   Cardiac Enzymes: No results for input(s): "CKTOTAL", "CKMB", "CKMBINDEX", "TROPONINI" in the last 168 hours. BNP: Invalid input(s): "POCBNP" CBG: No results for input(s): "GLUCAP" in the last 168 hours. HbA1C: No results for input(s): "HGBA1C" in the last 72 hours. Urine analysis:    Component Value Date/Time   COLORURINE YELLOW 04/29/2022 0803   APPEARANCEUR HAZY (A) 04/29/2022 0803   LABSPEC 1.017 04/29/2022 0803   PHURINE 5.0 04/29/2022 0803   GLUCOSEU NEGATIVE 04/29/2022 0803   HGBUR NEGATIVE 04/29/2022 0803   BILIRUBINUR NEGATIVE 04/29/2022 0803   KETONESUR 20 (A) 04/29/2022 0803   PROTEINUR NEGATIVE 04/29/2022 0803   UROBILINOGEN 1.0 06/12/2015 1021   NITRITE NEGATIVE 04/29/2022 0803   LEUKOCYTESUR NEGATIVE 04/29/2022 0803   Sepsis Labs: @LABRCNTIP (procalcitonin:4,lacticidven:4) ) Recent Results (from the past 240 hour(s))  Resp Panel by RT-PCR (Flu A&B, Covid) Anterior Nasal Swab     Status: None   Collection Time: 05/21/22  4:00 PM   Specimen: Anterior Nasal Swab  Result Value Ref Range Status   SARS Coronavirus 2 by RT PCR NEGATIVE NEGATIVE Final    Comment: (NOTE) SARS-CoV-2 target nucleic acids are NOT DETECTED.  The SARS-CoV-2 RNA is generally detectable in upper respiratory specimens during the acute phase of infection. The lowest concentration of SARS-CoV-2 viral copies this assay can detect is 138 copies/mL. A negative result does not preclude SARS-Cov-2 infection and should not be used as the sole basis for treatment or other patient management decisions. A negative result may occur with  improper specimen collection/handling, submission of specimen other than nasopharyngeal swab, presence of viral mutation(s) within the areas targeted by this assay, and inadequate number of viral copies(<138 copies/mL). A negative result must be combined with clinical observations, patient history, and epidemiological information. The expected result is  Negative.  Fact Sheet for Patients:  EntrepreneurPulse.com.au  Fact Sheet for Healthcare Providers:  IncredibleEmployment.be  This test is no t yet approved or cleared by the [place] FDA and  has been authorized for detection and/or diagnosis of SARS-CoV-2 by FDA under an Emergency Use Authorization (EUA). This EUA will remain  in effect (meaning this test can be used) for the duration of the COVID-19 declaration under Section 564(b)(1) of the Act, 21 U.S.C.section 360bbb-3(b)(1), unless the authorization is terminated  or revoked sooner.       Influenza A by PCR NEGATIVE NEGATIVE Final   Influenza B by PCR NEGATIVE NEGATIVE Final  Comment: (NOTE) The Xpert Xpress SARS-CoV-2/FLU/RSV plus assay is intended as an aid in the diagnosis of influenza from Nasopharyngeal swab specimens and should not be used as a sole basis for treatment. Nasal washings and aspirates are unacceptable for Xpert Xpress SARS-CoV-2/FLU/RSV testing.  Fact Sheet for Patients: EntrepreneurPulse.com.au  Fact Sheet for Healthcare Providers: IncredibleEmployment.be  This test is not yet approved or cleared by the [place] FDA and has been authorized for detection and/or diagnosis of SARS-CoV-2 by FDA under an Emergency Use Authorization (EUA). This EUA will remain in effect (meaning this test can be used) for the duration of the COVID-19 declaration under Section 564(b)(1) of the Act, 21 U.S.C. section 360bbb-3(b)(1), unless the authorization is terminated or revoked.  Performed at [facility], [address] [place], [place] [address]   MRSA Next Gen by PCR, Nasal     Status: None   Collection Time: 05/21/22  8:06 PM   Specimen: Nasal Mucosa; Nasal Swab  Result Value Ref Range Status   MRSA by PCR Next Gen NOT DETECTED NOT DETECTED Final    Comment: (NOTE) The GeneXpert MRSA Assay (FDA approved for NASAL specimens  only), is one component of a comprehensive MRSA colonization surveillance program. It is not intended to diagnose MRSA infection nor to guide or monitor treatment for MRSA infections. Test performance is not FDA approved in patients less than 37 years old. Performed at [facility], [address] [place], [place] [address]      Scheduled Meds:  apixaban  5 mg Oral BID   arformoterol  15 mcg Nebulization BID   atorvastatin  40 mg Oral Daily   budesonide (PULMICORT) nebulizer solution  0.5 mg Nebulization BID   collagenase   Topical Daily   dorzolamide  1 drop Left Eye BID   gabapentin  100 mg Oral TID   guaiFENesin  1,200 mg Oral BID   latanoprost  1 drop Both Eyes QHS   methylPREDNISolone (SOLU-MEDROL) injection  60 mg Intravenous Q12H   pantoprazole  40 mg Oral BID   phosphorus  500 mg Oral BID   silver sulfADIAZINE   Topical Daily   Continuous Infusions:  Procedures/Studies: MR THORACIC SPINE WO CONTRAST  Result Date: 05/21/2022 CLINICAL DATA:  Initial evaluation for soft tissue ulceration to back. EXAM: MRI THORACIC AND LUMBAR SPINE WITHOUT CONTRAST TECHNIQUE: Multiplanar and multiecho pulse sequences of the thoracic and lumbar spine were obtained without intravenous contrast. COMPARISON:  Prior MRI from 08/26/2019. FINDINGS: MRI THORACIC SPINE FINDINGS Alignment: Physiologic with preservation of the normal thoracic kyphosis. No significant listhesis. Vertebrae: Susceptibility artifact related to prior ACDF noted at C5-6 on counter sequence. Underlying bone marrow signal intensity within normal limits. No worrisome osseous lesions. Multiple acute to subacute compression fractures are seen as follows; 1. T5 compression fracture with up to 50% height loss and trace 2 mm bony retropulsion. 2. T6 compression fracture with up to 35% height loss and trace 2 mm bony retropulsion. 3. T7 compression fracture with 35% height loss without bony retropulsion. 4. T8 compression fracture with up to  50% height loss and 3 mm bony retropulsion. 5. T9 compression fracture with 20% height loss without significant bony retropulsion. 6. T11 compression fracture with mild 35% central height loss without significant bony retropulsion. 7. T12 compression fracture with mild 15% height loss without significant bony retropulsion. Marrow edema involving the left transverse process of T6 likely reflects an acute nondisplaced fracture (a series 18, image 15). Additional chronic T10 compression fracture with  mild 30% central height loss without retropulsion noted. No evidence for osteomyelitis discitis within the thoracic spine. Cord:  Normal signal and morphology. Paraspinal and other soft tissues: Mild diffuse edema noted within the subcutaneous fat of the posterior thoracolumbar spine. No loculated collections. Consolidative opacity at the visualized posterior left lung base, which could reflect atelectasis or infiltrate. Disc levels: No significant disc pathology seen within the thoracic spine for age. Trace bony retropulsion about several of the compression fractures as above. No significant spinal stenosis. Foramina remain largely patent. MRI LUMBAR SPINE FINDINGS Segmentation: Transitional features seen about the lumbosacral junction with a partially lumbarized S1 segment. Alignment: Physiologic with preservation of the normal lumbar lordosis. No significant listhesis. Vertebrae: Bone marrow signal intensity within normal limits. No discrete or worrisome osseous lesions. Several acute to early subacute compression fractures are seen as follows; 1. Compression fracture involving the superior endplate of L2 with 67% central height loss with trace 2 mm bony retropulsion. 2. Compression fracture involving the L3 vertebral body with mild 10% height loss without significant bony retropulsion. 3. Acute to subacute on chronic compression fracture involving the L4 vertebral body with complete central height loss with trace 2 mm  bony retropulsion. 4. Compression fracture involving the superior endplate of L5 with mild 20% height loss without bony retropulsion. 5. Compression fracture involving the superior endplate of S1 without significant height loss or bony retropulsion. Conus medullaris and cauda equina: Conus extends to the L1 level. Conus and cauda equina appear normal. Paraspinal and other soft tissues: Soft tissue defect overlying the spinous processes of L1 through L3, compatible with ulceration (series 2, image 6). Diffuse edema within the underlying subcutaneous fat and paraspinous musculature. No discrete or loculated collections. No convincing evidence for osteomyelitis discitis or septic arthritis. Visualized visceral structures within normal limits. Disc levels: L1-2: Negative interspace. Mild facet hypertrophy. No significant stenosis. L2-3: Mild disc bulge with trace bony retropulsion. Mild facet hypertrophy. No significant spinal stenosis. Foramina remain patent. L3-4: Disc bulge with disc desiccation. Superimposed small left foraminal disc protrusion closely approximates the exiting left L3 nerve root (series 5, image 16). Mild facet hypertrophy. No significant spinal stenosis. Foramina remain patent. L4-5: Mild disc bulge with disc desiccation and trace bony retropulsion. Mild facet hypertrophy. No significant spinal stenosis. Moderate right greater than left L4 foraminal narrowing. L5-S1: Mild diffuse disc bulge. Moderate facet hypertrophy with associated small joint effusions. No significant spinal stenosis. Moderate right with mild-to-moderate left L5 foraminal narrowing. S1-2: Rudimentary S1-2 interspace. No disc bulge or focal disc herniation. No stenosis. IMPRESSION: 1. Multiple acute to subacute compression fractures involving the T5, T6, T7, T8, T9, T11, T12, L2, L3, L4, L5, and S1 as detailed above. No more than trace bony retropulsion at several levels without significant spinal stenosis. 2. Soft tissue  ulceration overlying the lower back at the level of the spinous processes of L1 through L3. Diffuse edema throughout the underlying posterior paraspinous soft tissues without loculated or drainable fluid collection. No convincing evidence for osteomyelitis discitis or septic arthritis within the thoracolumbar spine itself. 3. Mild multilevel lumbar spondylosis without significant spinal stenosis. Moderate bilateral L4 and L5 foraminal narrowing. 4. Patchy consolidative opacity within the posterior left lung base, either atelectasis or infiltrate. Electronically Signed   By: Jeannine Boga M.D.   On: 05/21/2022 20:06   MR LUMBAR SPINE WO CONTRAST  Result Date: 05/21/2022 CLINICAL DATA:  Initial evaluation for soft tissue ulceration to back. EXAM: MRI THORACIC AND LUMBAR SPINE WITHOUT CONTRAST  TECHNIQUE: Multiplanar and multiecho pulse sequences of the thoracic and lumbar spine were obtained without intravenous contrast. COMPARISON:  Prior MRI from 08/26/2019. FINDINGS: MRI THORACIC SPINE FINDINGS Alignment: Physiologic with preservation of the normal thoracic kyphosis. No significant listhesis. Vertebrae: Susceptibility artifact related to prior ACDF noted at C5-6 on counter sequence. Underlying bone marrow signal intensity within normal limits. No worrisome osseous lesions. Multiple acute to subacute compression fractures are seen as follows; 1. T5 compression fracture with up to 50% height loss and trace 2 mm bony retropulsion. 2. T6 compression fracture with up to 35% height loss and trace 2 mm bony retropulsion. 3. T7 compression fracture with 35% height loss without bony retropulsion. 4. T8 compression fracture with up to 50% height loss and 3 mm bony retropulsion. 5. T9 compression fracture with 20% height loss without significant bony retropulsion. 6. T11 compression fracture with mild 35% central height loss without significant bony retropulsion. 7. T12 compression fracture with mild 15% height loss  without significant bony retropulsion. Marrow edema involving the left transverse process of T6 likely reflects an acute nondisplaced fracture (a series 18, image 15). Additional chronic T10 compression fracture with mild 30% central height loss without retropulsion noted. No evidence for osteomyelitis discitis within the thoracic spine. Cord:  Normal signal and morphology. Paraspinal and other soft tissues: Mild diffuse edema noted within the subcutaneous fat of the posterior thoracolumbar spine. No loculated collections. Consolidative opacity at the visualized posterior left lung base, which could reflect atelectasis or infiltrate. Disc levels: No significant disc pathology seen within the thoracic spine for age. Trace bony retropulsion about several of the compression fractures as above. No significant spinal stenosis. Foramina remain largely patent. MRI LUMBAR SPINE FINDINGS Segmentation: Transitional features seen about the lumbosacral junction with a partially lumbarized S1 segment. Alignment: Physiologic with preservation of the normal lumbar lordosis. No significant listhesis. Vertebrae: Bone marrow signal intensity within normal limits. No discrete or worrisome osseous lesions. Several acute to early subacute compression fractures are seen as follows; 1. Compression fracture involving the superior endplate of L2 with 82% central height loss with trace 2 mm bony retropulsion. 2. Compression fracture involving the L3 vertebral body with mild 10% height loss without significant bony retropulsion. 3. Acute to subacute on chronic compression fracture involving the L4 vertebral body with complete central height loss with trace 2 mm bony retropulsion. 4. Compression fracture involving the superior endplate of L5 with mild 20% height loss without bony retropulsion. 5. Compression fracture involving the superior endplate of S1 without significant height loss or bony retropulsion. Conus medullaris and cauda equina:  Conus extends to the L1 level. Conus and cauda equina appear normal. Paraspinal and other soft tissues: Soft tissue defect overlying the spinous processes of L1 through L3, compatible with ulceration (series 2, image 6). Diffuse edema within the underlying subcutaneous fat and paraspinous musculature. No discrete or loculated collections. No convincing evidence for osteomyelitis discitis or septic arthritis. Visualized visceral structures within normal limits. Disc levels: L1-2: Negative interspace. Mild facet hypertrophy. No significant stenosis. L2-3: Mild disc bulge with trace bony retropulsion. Mild facet hypertrophy. No significant spinal stenosis. Foramina remain patent. L3-4: Disc bulge with disc desiccation. Superimposed small left foraminal disc protrusion closely approximates the exiting left L3 nerve root (series 5, image 16). Mild facet hypertrophy. No significant spinal stenosis. Foramina remain patent. L4-5: Mild disc bulge with disc desiccation and trace bony retropulsion. Mild facet hypertrophy. No significant spinal stenosis. Moderate right greater than left L4 foraminal narrowing. L5-S1:  Mild diffuse disc bulge. Moderate facet hypertrophy with associated small joint effusions. No significant spinal stenosis. Moderate right with mild-to-moderate left L5 foraminal narrowing. S1-2: Rudimentary S1-2 interspace. No disc bulge or focal disc herniation. No stenosis. IMPRESSION: 1. Multiple acute to subacute compression fractures involving the T5, T6, T7, T8, T9, T11, T12, L2, L3, L4, L5, and S1 as detailed above. No more than trace bony retropulsion at several levels without significant spinal stenosis. 2. Soft tissue ulceration overlying the lower back at the level of the spinous processes of L1 through L3. Diffuse edema throughout the underlying posterior paraspinous soft tissues without loculated or drainable fluid collection. No convincing evidence for osteomyelitis discitis or septic arthritis within  the thoracolumbar spine itself. 3. Mild multilevel lumbar spondylosis without significant spinal stenosis. Moderate bilateral L4 and L5 foraminal narrowing. 4. Patchy consolidative opacity within the posterior left lung base, either atelectasis or infiltrate. Electronically Signed   By: Jeannine Boga M.D.   On: 05/21/2022 20:06   DG Chest Port 1 View  Result Date: 05/21/2022 CLINICAL DATA:  Shortness of breath.  Smoker. EXAM: PORTABLE CHEST 1 VIEW COMPARISON:  05/15/2022 FINDINGS: Normal sized heart. Clear lungs. The lungs remain mildly hyperexpanded. Previously described callus associated with the right posterior 6th rib, compatible with an old, healed fracture. IMPRESSION: No acute abnormality.  Stable mild changes of COPD. Electronically Signed   By: Claudie Revering M.D.   On: 05/21/2022 15:44   CT Angio Chest PE W and/or Wo Contrast  Result Date: 05/15/2022 CLINICAL DATA:  Hemoptysis since yesterday morning, non-small cell lung cancer EXAM: CT ANGIOGRAPHY CHEST WITH CONTRAST TECHNIQUE: Multidetector CT imaging of the chest was performed using the standard protocol during bolus administration of intravenous contrast. Multiplanar CT image reconstructions and MIPs were obtained to evaluate the vascular anatomy. RADIATION DOSE REDUCTION: This exam was performed according to the departmental dose-optimization program which includes automated exposure control, adjustment of the mA and/or kV according to patient size and/or use of iterative reconstruction technique. CONTRAST:  59mL OMNIPAQUE IOHEXOL 350 MG/ML SOLN COMPARISON:  05/07/2022 FINDINGS: Cardiovascular: This is a technically adequate evaluation of the pulmonary vasculature. There are no filling defects or pulmonary emboli. The heart is unremarkable without pericardial effusion. No evidence of thoracic aortic aneurysm or dissection. Stable atherosclerosis of the aorta and coronary vasculature. Mediastinum/Nodes: No enlarged mediastinal, hilar, or  axillary lymph nodes. Thyroid gland, trachea, and esophagus demonstrate no significant findings. Lungs/Pleura: No significant change in the large endobronchial lesion occupying the majority of the left upper lobe bronchial tree. Severe upper lobe predominant emphysema is again noted. No acute airspace disease. Trace left pleural effusion. No pneumothorax. Upper Abdomen: Stable nodular right adrenal thickening measuring 2 x 1 cm, mean attenuation -3 HU, most consistent with adenoma. No acute upper abdominal findings. Musculoskeletal: No acute or destructive bony lesions. Multiple prior healed bilateral rib fractures. Chronic compression deformities at T5, T7, T8, T11, and T12. Reconstructed images demonstrate no additional findings. Review of the MIP images confirms the above findings. IMPRESSION: 1. No evidence of pulmonary embolus. 2. Stable left upper lobe endobronchial mass consistent with known lung cancer. 3. Trace left pleural effusion. 4. Aortic Atherosclerosis (ICD10-I70.0) and Emphysema (ICD10-J43.9). 5. Stable right adrenal nodule, previously characterized as adenoma. Electronically Signed   By: Randa Ngo M.D.   On: 05/15/2022 18:04   CT Head Wo Contrast  Result Date: 05/15/2022 CLINICAL DATA:  Mental status change. EXAM: CT HEAD WITHOUT CONTRAST TECHNIQUE: Contiguous axial images were obtained from the  base of the skull through the vertex without intravenous contrast. RADIATION DOSE REDUCTION: This exam was performed according to the departmental dose-optimization program which includes automated exposure control, adjustment of the mA and/or kV according to patient size and/or use of iterative reconstruction technique. COMPARISON:  None Available. FINDINGS: Brain: No evidence of acute infarction, hemorrhage, hydrocephalus, extra-axial collection or mass lesion/mass effect. Vascular: No hyperdense vessel or unexpected calcification. Skull: Normal. Negative for fracture or focal lesion.  Sinuses/Orbits: Minimal polypoid thickening in the maxillary sinuses Other: None. IMPRESSION: No acute intracranial abnormality. Electronically Signed   By: Fidela Salisbury M.D.   On: 05/15/2022 18:02   DG Chest Portable 1 View  Result Date: 05/15/2022 CLINICAL DATA:  Hemoptysis.  Pain. EXAM: PORTABLE CHEST 1 VIEW COMPARISON:  05/07/2022 CT and 05/06/2022 chest x-ray FINDINGS: Heart size is normal. There is perihilar peribronchial thickening. No new consolidations or pleural effusions. No evidence for pulmonary edema. Remote RIGHT posterior 6th rib fracture. IMPRESSION: No evidence for acute abnormality. Chronic bronchitic changes. Electronically Signed   By: Nolon Nations M.D.   On: 05/15/2022 13:48   CT CHEST ABDOMEN PELVIS W CONTRAST  Result Date: 05/08/2022 CLINICAL DATA:  Non-small cell lung cancer, acute respiratory failure with hypoxia, failure to thrive, nausea/vomiting EXAM: CT CHEST, ABDOMEN, AND PELVIS WITH CONTRAST TECHNIQUE: Multidetector CT imaging of the chest, abdomen and pelvis was performed following the standard protocol during bolus administration of intravenous contrast. RADIATION DOSE REDUCTION: This exam was performed according to the departmental dose-optimization program which includes automated exposure control, adjustment of the mA and/or kV according to patient size and/or use of iterative reconstruction technique. CONTRAST:  147mL OMNIPAQUE IOHEXOL 300 MG/ML  SOLN COMPARISON:  CT chest dated 03/18/2022. CTA chest abdomen pelvis dated 10/17/2021. FINDINGS: CT CHEST FINDINGS Cardiovascular: No evidence of thoracic aortic aneurysm. Mild atherosclerotic calcifications of the aortic arch. The heart is normal in size.  No pericardial effusion. Coronary atherosclerosis of the LAD and left circumflex. Mediastinum/Nodes: No suspicious mediastinal lymphadenopathy. Visualized thyroid is unremarkable. Lungs/Pleura: Endobronchial soft tissue in the proximal left upper lobe bronchus  (series 2/image 34) and extending into the central left upper lobe (series 2/image 28), difficult to discretely measure but measuring approximately 4.9 cm in maximal craniocaudal dimension (coronal image 80) new. This corresponds to the patient's known primary bronchogenic neoplasm and is favored to be mildly progressive. Moderate centrilobular and paraseptal emphysematous changes, upper lung predominant. No focal consolidation. No pleural effusion or pneumothorax. Musculoskeletal: Moderate compression fracture deformity at T5 and T8, grossly unchanged. Mild superior endplate changes at C62. Cervical spine fixation hardware, incompletely visualized. CT ABDOMEN PELVIS FINDINGS Hepatobiliary: Liver is within normal limits. No suspicious/enhancing hepatic lesions. Gallbladder is underdistended but unremarkable. No intrahepatic or extrahepatic duct dilatation. Pancreas: Within normal limits. Spleen: Within normal limits. Adrenals/Urinary Tract: Stable 2.1 cm right adrenal nodule (series 2/image 68), unchanged from remote priors, favoring a benign adrenal adenoma. Left adrenal gland is within normal limits. Kidneys are notable for probable early excretory contrast in the collecting systems. No hydronephrosis. Excretory contrast in the bladder. Stomach/Bowel: Mildly distended stomach. No evidence of bowel obstruction. Appendix is not discretely visualized. No colonic wall thickening or inflammatory changes. Vascular/Lymphatic: No evidence of abdominal aortic aneurysm. Atherosclerotic calcifications of the abdominal aorta and branch vessels. No suspicious abdominopelvic lymphadenopathy. Reproductive: Prostate is unremarkable. Other: No abdominopelvic ascites. Musculoskeletal: Moderate compression fracture deformity at L4, unchanged. Mild superior endplate compression fracture deformity at L2 and L5, new. Mild superior endplate changes at L1, unchanged. IMPRESSION:  Stable central left upper lobe mass, corresponding to the  patient's known primary bronchogenic neoplasm. No findings suspicious for metastatic disease. Stable benign right adrenal adenoma. Multiple thoracolumbar compression fracture deformities, with new mild compression fracture deformities at L2 and L5. Aortic Atherosclerosis (ICD10-I70.0) and Emphysema (ICD10-J43.9). Electronically Signed   By: Julian Hy M.D.   On: 05/08/2022 01:08   MR Brain W Wo Contrast  Result Date: 05/07/2022 EXAM: MRI HEAD WITHOUT AND WITH CONTRAST MRA HEAD WITHOUT CONTRAST TECHNIQUE: Multiplanar, multi-echo pulse sequences of the brain and surrounding structures were acquired without and with intravenous contrast. Angiographic images of the Circle of Willis were acquired using MRA technique without intravenous contrast. CONTRAST:  55mL GADAVIST GADOBUTROL 1 MMOL/ML IV SOLN COMPARISON:  Prior CT from 10/27/2021. FINDINGS: MRI HEAD FINDINGS Brain: Examination moderately degraded by motion artifact. Cerebral volume within normal limits. Patchy T2/FLAIR hyperintensity involving the periventricular deep white matter both cerebral hemispheres as well as the pons, most consistent with chronic small vessel ischemic disease, mild in nature. No evidence for acute or subacute infarct. Gray-white matter differentiation maintained. No areas of chronic cortical infarction. No acute or chronic intracranial blood products. No mass lesion, midline shift or mass effect no hydrocephalus or extra-axial fluid collection. Pituitary gland suprasellar region normal. No abnormal enhancement. Vascular: Major intracranial vascular flow voids are maintained. Skull and upper cervical spine: Craniocervical junction within normal limits. Bone marrow signal intensity normal. No focal marrow replacing lesion. No scalp soft tissue abnormality. Sinuses/Orbits: Prior bilateral ocular lens replacement. Mild scattered mucosal thickening with a few small retention cysts noted about the ethmoidal air cells and maxillary  sinuses. No significant mastoid effusion. Other: None. MRA HEAD FINDINGS Anterior circulation: Examination moderately degraded by motion artifact. Both internal carotid arteries patent to the termini without significant stenosis or other appreciable abnormality. A1 segments patent bilaterally. Grossly normal anterior communicating artery complex. Both ACAs grossly patent to their distal aspects without appreciable stenosis. No visible M1 stenosis or occlusion. No proximal MCA branch occlusion. Distal MCA branches grossly perfused and symmetric. Posterior circulation: Visualized distal V4 segments patent without stenosis. Right vertebral artery dominant. Neither PICA well visualized. Basilar patent to its distal aspect without stenosis. Superior cerebellar and posterior cerebral arteries patent bilaterally with no appreciable significant stenosis. Anatomic variants: None significant. IMPRESSION: MRI HEAD IMPRESSION: 1. Motion artifact degraded exam. 2. No acute intracranial abnormality. No evidence for intracranial metastatic disease. 3. Mild chronic microvascular ischemic disease. MRA HEAD IMPRESSION: 1. Motion degraded exam. 2. Grossly negative intracranial MRA. No large vessel occlusion, hemodynamically significant stenosis, or other acute vascular abnormality. Electronically Signed   By: Jeannine Boga M.D.   On: 05/07/2022 20:36   MR ANGIO HEAD WO CONTRAST  Result Date: 05/07/2022 EXAM: MRI HEAD WITHOUT AND WITH CONTRAST MRA HEAD WITHOUT CONTRAST TECHNIQUE: Multiplanar, multi-echo pulse sequences of the brain and surrounding structures were acquired without and with intravenous contrast. Angiographic images of the Circle of Willis were acquired using MRA technique without intravenous contrast. CONTRAST:  11mL GADAVIST GADOBUTROL 1 MMOL/ML IV SOLN COMPARISON:  Prior CT from 10/27/2021. FINDINGS: MRI HEAD FINDINGS Brain: Examination moderately degraded by motion artifact. Cerebral volume within normal  limits. Patchy T2/FLAIR hyperintensity involving the periventricular deep white matter both cerebral hemispheres as well as the pons, most consistent with chronic small vessel ischemic disease, mild in nature. No evidence for acute or subacute infarct. Gray-white matter differentiation maintained. No areas of chronic cortical infarction. No acute or chronic intracranial blood products. No mass lesion, midline  shift or mass effect no hydrocephalus or extra-axial fluid collection. Pituitary gland suprasellar region normal. No abnormal enhancement. Vascular: Major intracranial vascular flow voids are maintained. Skull and upper cervical spine: Craniocervical junction within normal limits. Bone marrow signal intensity normal. No focal marrow replacing lesion. No scalp soft tissue abnormality. Sinuses/Orbits: Prior bilateral ocular lens replacement. Mild scattered mucosal thickening with a few small retention cysts noted about the ethmoidal air cells and maxillary sinuses. No significant mastoid effusion. Other: None. MRA HEAD FINDINGS Anterior circulation: Examination moderately degraded by motion artifact. Both internal carotid arteries patent to the termini without significant stenosis or other appreciable abnormality. A1 segments patent bilaterally. Grossly normal anterior communicating artery complex. Both ACAs grossly patent to their distal aspects without appreciable stenosis. No visible M1 stenosis or occlusion. No proximal MCA branch occlusion. Distal MCA branches grossly perfused and symmetric. Posterior circulation: Visualized distal V4 segments patent without stenosis. Right vertebral artery dominant. Neither PICA well visualized. Basilar patent to its distal aspect without stenosis. Superior cerebellar and posterior cerebral arteries patent bilaterally with no appreciable significant stenosis. Anatomic variants: None significant. IMPRESSION: MRI HEAD IMPRESSION: 1. Motion artifact degraded exam. 2. No acute  intracranial abnormality. No evidence for intracranial metastatic disease. 3. Mild chronic microvascular ischemic disease. MRA HEAD IMPRESSION: 1. Motion degraded exam. 2. Grossly negative intracranial MRA. No large vessel occlusion, hemodynamically significant stenosis, or other acute vascular abnormality. Electronically Signed   By: Jeannine Boga M.D.   On: 05/07/2022 20:36   DG Chest Port 1 View  Result Date: 05/06/2022 CLINICAL DATA:  Shortness of breath EXAM: PORTABLE CHEST 1 VIEW COMPARISON:  04/29/2022 radiograph and 03/18/2022 chest CT FINDINGS: Chronic hyperinflation and interstitial coarsening. Biapical lucency. Vague rounded density over the right chest correlates with rib fracture callus by CT 2 months ago. Stable appearance of left supra hilum where there is endobronchial mass and airway obstruction by comparison CT. Normal heart size and mediastinal contours. IMPRESSION: COPD without acute superimposed finding. Electronically Signed   By: Jorje Guild M.D.   On: 05/06/2022 04:49   DG Chest Port 1 View  Result Date: 04/29/2022 CLINICAL DATA:  Shortness of breath and hypoxemia. EXAM: PORTABLE CHEST 1 VIEW COMPARISON:  Portable chest 04/22/2022. FINDINGS: The lungs are emphysematous and clear of acute infiltrates. There is scattered linear scarring. Heart size and vasculature are normal with stable mediastinal silhouette. Mild aortic atherosclerosis. Osteopenia. IMPRESSION: Stable COPD chest with no acute cardiopulmonary findings. Electronically Signed   By: Telford Nab M.D.   On: 04/29/2022 03:14    Orson Eva, DO  [facility]  If 7PM-7AM, please contact night-coverage [email or web address] Password [email or web address] 05/27/2022, 6:22 PM   LOS: 6 days

## 2022-05-28 DIAGNOSIS — C3490 Malignant neoplasm of unspecified part of unspecified bronchus or lung: Secondary | ICD-10-CM | POA: Diagnosis not present

## 2022-05-28 DIAGNOSIS — J9622 Acute and chronic respiratory failure with hypercapnia: Secondary | ICD-10-CM | POA: Diagnosis not present

## 2022-05-28 DIAGNOSIS — J9621 Acute and chronic respiratory failure with hypoxia: Secondary | ICD-10-CM | POA: Diagnosis not present

## 2022-05-28 DIAGNOSIS — J441 Chronic obstructive pulmonary disease with (acute) exacerbation: Secondary | ICD-10-CM | POA: Diagnosis not present

## 2022-05-28 LAB — GLUCOSE, CAPILLARY: Glucose-Capillary: 153 mg/dL — ABNORMAL HIGH (ref 70–99)

## 2022-05-28 MED ORDER — PREDNISONE 20 MG PO TABS: 60.0000 mg | ORAL_TABLET | Freq: Every day | ORAL | Status: DC

## 2022-05-28 MED ORDER — PREDNISONE 10 MG PO TABS: 60.0000 mg | ORAL_TABLET | Freq: Every day | ORAL | 0 refills | Status: DC

## 2022-05-28 NOTE — Progress Notes (Signed)
Patient ready for discharge. Discharge instructions given to patient by previous nurse. IV discontinued, site WNL. Family member in room to transport patient home. Family member concerned about patients eyes, encouraged to contact Patient's Ophthamologist in AM. Patient will let staff know when he is ready to leave.

## 2022-05-28 NOTE — Progress Notes (Signed)
Patient refused dressing change to BLE , states 3rd shift just changed them , he requested when his " people " get here to wash his back and change dressings

## 2022-05-28 NOTE — Progress Notes (Signed)
Patient escorted  by this RN to ED entrance via his private wheelchair and released to the care of his family member.

## 2022-05-28 NOTE — Discharge Summary (Signed)
Physician Discharge Summary   Patient: Tony Sullivan MRN: [id number] DOB: 1961/06/04  Admit date:     05/21/2022  Discharge date: 05/28/22  Discharge Physician: Shanon Brow Kierre Hintz   PCP: [name], FNP   Recommendations at discharge:   Please follow up with primary care provider within 1-2 weeks  Please repeat BMP and CBC in one week     Hospital Course:  61 year old male with a history of COPD, chronic respiratory failure on 4.5 L at home, anxiety/depression, hypertension, pulmonary embolus (03/13/2022 CTA chest) on apixaban, NSCLC diagnosed 03/22/2022 via bronchoscopy (Dr. Elsworth Soho) presenting with worsening shortness of breath over the past 2 to 3 days.  The patient has had numerous hospital admissions with similar presentations.  It appears that the patient has poor social circumstances and poor ability to take care of himself.  The patient was admitted to the hospital from 04/30/2022 to 05/02/2022 secondary to COPD exacerbation.  Once again, he was admitted from 05/06/2022 to 05/08/2022 with a similar presentation.  He was discharged home with prednisone taper. Upon presentation, the patient was tachypneic with oxygen saturation 90% on 6 L.  He was started on IV Solu-Medrol, DuoNebs.  General surgery was consulted for his wounds.   Assessment and Plan: * Acute exacerbation of chronic obstructive pulmonary disease (COPD) (HCC) Currently stable on 5-6 L continue DuoNebs and Brovana Continue Pulmicort Continue IV Solu-Medrol Added Yupelri --aim for oxygen saturation 88-90% Morphine prn sob --he does not need to be on 5-6L as he claims he needs--aim for saturations 88-90% Add hypertonic saline x 2 days --pt continues to intermittently refuse nebs and other therapy Overall improved. D/c home with prednisone taper  Pressure injury of skin Multiple ulcers, some of which do not appear to be pressure injury related and some appear infected, but without surrounding signs of cellulitis. 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  He is afebrile without leukocytosis, and rules out for sepsis at this time. -MRI thoracic and lumbar spine--neg for osteo, discitis, abscess -appreciate wound care RN consult -Wound care consult for lower extremity ulcers -general surgery eval>>no   need for surgical debridement at this time.  Continue Santyl as ordered -pt continues to intermittently refuse dressing changes during the hospitalization  Goals of care, counseling/discussion 7/29 and 7/30  GOC discussed with patient --he confirms continue full scope of care --consult palliative medicine to further the conversation --he is clearly not ready for hospice, DNR, etc presently --7/31 discussed with patient's daughter--she confirms that she and other family are very involved in his care at home and that pt has a personal aide that helps him 3 hours per day  Decubitus ulcer of back, stage 2 ([place]) As above  History of pulmonary embolus (PE) Restart apixaban  Non-small cell lung cancer ([place]) Seen on CT 03/13/2022  -patient had bronchoscopy 5/26, showed endobronchial mass blocking takeoff of left upper lobe bronchus. 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   Patient has missed several appointments with oncology as outpatient for follow-up on his newly diagnosed lung cancer.   Dr. Delton Coombes was consulted during his recent hospitalization, recommended CAP CT With contrast and MRI brain (neither was suspicious for metastatic disease, though MRI was motion degraded ) for further staging of his large cell cancer, he was to follow-up after scans to discuss plan. --discussed with Dr. Delton Coombes 7/27>>no chemo, needs XRT which Dr. Raliegh Ip will help set  up --discussed with Dr. Kenna Gilbert for 10 XRT treatments after d/c --pt and daughter have been informed of the XRT plan and to expect a call from Dr. Clabe Seal offic  Acute on chronic  respiratory failure with hypoxia and hypercapnia ([place]) Chronically on 4-5 L nasal cannula Initially required 6 L nasal cannula with oxygen saturation 90% Wean oxygen as tolerated Secondary to COPD exacerbation --aim for oxygen saturation 88-92% --morphine prn sob --he does not need to be on 5-6L that he claims he needs>> Added hypertonic saline --as from prior, he continues to intermittenly refuse nebs and other therapy  Bipolar disorder ([place]) Resume home meds  Tobacco abuse - Continues to smoke 1-2 packs per week per patient         Consultants: none Procedures performed: none  Disposition: Home Diet recommendation:  Cardiac diet DISCHARGE MEDICATION: Allergies as of 05/28/2022       Reactions   Penicillins Anaphylaxis   Has patient had a PCN reaction causing immediate rash, facial/tongue/throat swelling, SOB or lightheadedness with hypotension: Yes Has patient had a PCN reaction causing severe rash involving mucus membranes or skin necrosis: No Has patient had a PCN reaction that required hospitalization No Has patient had a PCN reaction occurring within the last 10 years: No If all of the above answers are "NO", then may proceed with Cephalosporin use.   Levocetirizine Other (See Comments)   Muscle cramps        Medication List     STOP taking these medications    doxycycline 100 MG capsule Commonly known as: VIBRAMYCIN   potassium chloride SA 20 MEQ tablet Commonly known as: KLOR-CON M       TAKE these medications    acetaminophen 325 MG tablet Commonly known as: TYLENOL Take 2 tablets (650 mg total) by mouth every 6 (six) hours as needed for mild pain (or Fever >/= 101).   albuterol (2.5 MG/3ML) 0.083% nebulizer solution Commonly known as: PROVENTIL Take 3 mLs (2.5 mg total) by nebulization every 4 (four) hours as needed for wheezing or shortness of breath.   albuterol 108 (90 Base) MCG/ACT inhaler Commonly known as: VENTOLIN HFA Inhale 2 puffs  into the lungs every 6 (six) hours as needed for wheezing or shortness of breath.   apixaban 5 MG Tabs tablet Commonly known as: ELIQUIS Take 1 tablet (5 mg total) by mouth 2 (two) times daily.   atorvastatin 40 MG tablet Commonly known as: LIPITOR TAKE ONE TABLET BY MOUTH DAILY   bacitracin ointment Apply 1 Application topically 2 (two) times daily.   benztropine 0.5 MG tablet Commonly known as: COGENTIN Take 0.5 mg by mouth at bedtime.   Breztri Aerosphere 160-9-4.8 MCG/ACT Aero Generic drug: Budeson-Glycopyrrol-Formoterol Inhale 2 puffs into the lungs in the morning and at bedtime.   dextromethorphan-guaiFENesin 30-600 MG 12hr tablet Commonly known as: MUCINEX DM Take 1 tablet by mouth 2 (two) times daily.   diclofenac Sodium 1 % Gel Commonly known as: VOLTAREN Apply 2 g topically 4 (four) times daily as needed (pain).   dorzolamide 2 % ophthalmic solution Commonly known as: TRUSOPT Place 1 drop into the left eye 2 (two) times daily.   furosemide 20 MG tablet Commonly known as: LASIX Take 1 tablet (20 mg total) by mouth daily.   gabapentin 100 MG capsule Commonly known as: NEURONTIN Take 1 capsule (100 mg total) by mouth 3 (three) times daily.   hydrOXYzine 25 MG tablet Commonly known as: ATARAX Take 1 tablet (25 mg total)  by mouth 3 (three) times daily as needed for anxiety.   latanoprost 0.005 % ophthalmic solution Commonly known as: XALATAN Place 1 drop into both eyes at bedtime.   loxapine 10 MG capsule Commonly known as: LOXITANE Take 1 capsule (10 mg total) by mouth at bedtime.   mirtazapine 30 MG tablet Commonly known as: REMERON Take 1 tablet (30 mg total) by mouth at bedtime.   multivitamin with minerals Tabs tablet Take 1 tablet by mouth daily.   OXYGEN Inhale 4-5 L into the lungs as directed. 4-5 liters with sleep and exertion as needed for low oxygen   pantoprazole 40 MG tablet Commonly known as: PROTONIX Take 1 tablet (40 mg total) by  mouth 2 (two) times daily.   polyethylene glycol 17 g packet Commonly known as: MIRALAX / GLYCOLAX Take 17 g by mouth daily.   predniSONE 10 MG tablet Commonly known as: DELTASONE Take 6 tablets (60 mg total) by mouth daily with breakfast. And decrease by one tablet daily Start taking on: May 29, 2022   senna-docusate 8.6-50 MG tablet Commonly known as: Senokot-S Take 2 tablets by mouth at bedtime. What changed:  when to take this reasons to take this   simethicone 80 MG chewable tablet Commonly known as: MYLICON Chew 1 tablet (80 mg total) by mouth 4 (four) times daily as needed for flatulence. What changed: when to take this        [facility] Follow up.   Specialty: Rehabilitation Why: Wound Care will be managed by [facility] located at . [address] [place], [place], [place] [address]. The Center will call you to schedule your appointment for wound care. Contact information: [address] [id number] [place] [address] [phone]               Discharge Exam: Filed Weights   05/21/22 1433 05/21/22 2108 05/22/22 0500  Weight: 61.3 kg 58.4 kg 58.8 kg   HEENT:  [place]/AT, No thrush, no icterus CV:  RRR, no rub, no S3, no S4 Lung:  bibasilar rales.  Diminished BS.  No wheeze Abd:  soft/+BS, NT Ext:  No edema, no lymphangitis, no synovitis, no rash   Condition at discharge: stable  The results of significant diagnostics from this hospitalization (including imaging, microbiology, ancillary and laboratory) are listed below for reference.   Imaging Studies: MR THORACIC SPINE WO CONTRAST  Result Date: 05/21/2022 CLINICAL DATA:  Initial evaluation for soft tissue ulceration to back. EXAM: MRI THORACIC AND LUMBAR SPINE WITHOUT CONTRAST TECHNIQUE: Multiplanar and multiecho pulse sequences of the thoracic and lumbar spine were obtained without  intravenous contrast. COMPARISON:  Prior MRI from 08/26/2019. FINDINGS: MRI THORACIC SPINE FINDINGS Alignment: Physiologic with preservation of the normal thoracic kyphosis. No significant listhesis. Vertebrae: Susceptibility artifact related to prior ACDF noted at C5-6 on counter sequence. Underlying bone marrow signal intensity within normal limits. No worrisome osseous lesions. Multiple acute to subacute compression fractures are seen as follows; 1. T5 compression fracture with up to 50% height loss and trace 2 mm bony retropulsion. 2. T6 compression fracture with up to 35% height loss and trace 2 mm bony retropulsion. 3. T7 compression fracture with 35% height loss without bony retropulsion. 4. T8 compression fracture with up to 50% height loss and 3 mm bony retropulsion. 5. T9 compression fracture with 20% height loss without significant bony retropulsion. 6. T11 compression fracture with mild 35%  central height loss without significant bony retropulsion. 7. T12 compression fracture with mild 15% height loss without significant bony retropulsion. Marrow edema involving the left transverse process of T6 likely reflects an acute nondisplaced fracture (a series 18, image 15). Additional chronic T10 compression fracture with mild 30% central height loss without retropulsion noted. No evidence for osteomyelitis discitis within the thoracic spine. Cord:  Normal signal and morphology. Paraspinal and other soft tissues: Mild diffuse edema noted within the subcutaneous fat of the posterior thoracolumbar spine. No loculated collections. Consolidative opacity at the visualized posterior left lung base, which could reflect atelectasis or infiltrate. Disc levels: No significant disc pathology seen within the thoracic spine for age. Trace bony retropulsion about several of the compression fractures as above. No significant spinal stenosis. Foramina remain largely patent. MRI LUMBAR SPINE FINDINGS Segmentation: Transitional  features seen about the lumbosacral junction with a partially lumbarized S1 segment. Alignment: Physiologic with preservation of the normal lumbar lordosis. No significant listhesis. Vertebrae: Bone marrow signal intensity within normal limits. No discrete or worrisome osseous lesions. Several acute to early subacute compression fractures are seen as follows; 1. Compression fracture involving the superior endplate of L2 with 16% central height loss with trace 2 mm bony retropulsion. 2. Compression fracture involving the L3 vertebral body with mild 10% height loss without significant bony retropulsion. 3. Acute to subacute on chronic compression fracture involving the L4 vertebral body with complete central height loss with trace 2 mm bony retropulsion. 4. Compression fracture involving the superior endplate of L5 with mild 20% height loss without bony retropulsion. 5. Compression fracture involving the superior endplate of S1 without significant height loss or bony retropulsion. Conus medullaris and cauda equina: Conus extends to the L1 level. Conus and cauda equina appear normal. Paraspinal and other soft tissues: Soft tissue defect overlying the spinous processes of L1 through L3, compatible with ulceration (series 2, image 6). Diffuse edema within the underlying subcutaneous fat and paraspinous musculature. No discrete or loculated collections. No convincing evidence for osteomyelitis discitis or septic arthritis. Visualized visceral structures within normal limits. Disc levels: L1-2: Negative interspace. Mild facet hypertrophy. No significant stenosis. L2-3: Mild disc bulge with trace bony retropulsion. Mild facet hypertrophy. No significant spinal stenosis. Foramina remain patent. L3-4: Disc bulge with disc desiccation. Superimposed small left foraminal disc protrusion closely approximates the exiting left L3 nerve root (series 5, image 16). Mild facet hypertrophy. No significant spinal stenosis. Foramina  remain patent. L4-5: Mild disc bulge with disc desiccation and trace bony retropulsion. Mild facet hypertrophy. No significant spinal stenosis. Moderate right greater than left L4 foraminal narrowing. L5-S1: Mild diffuse disc bulge. Moderate facet hypertrophy with associated small joint effusions. No significant spinal stenosis. Moderate right with mild-to-moderate left L5 foraminal narrowing. S1-2: Rudimentary S1-2 interspace. No disc bulge or focal disc herniation. No stenosis. IMPRESSION: 1. Multiple acute to subacute compression fractures involving the T5, T6, T7, T8, T9, T11, T12, L2, L3, L4, L5, and S1 as detailed above. No more than trace bony retropulsion at several levels without significant spinal stenosis. 2. Soft tissue ulceration overlying the lower back at the level of the spinous processes of L1 through L3. Diffuse edema throughout the underlying posterior paraspinous soft tissues without loculated or drainable fluid collection. No convincing evidence for osteomyelitis discitis or septic arthritis within the thoracolumbar spine itself. 3. Mild multilevel lumbar spondylosis without significant spinal stenosis. Moderate bilateral L4 and L5 foraminal narrowing. 4. Patchy consolidative opacity within the posterior left lung base, either atelectasis  or infiltrate. Electronically Signed   By: Jeannine Boga M.D.   On: 05/21/2022 20:06   MR LUMBAR SPINE WO CONTRAST  Result Date: 05/21/2022 CLINICAL DATA:  Initial evaluation for soft tissue ulceration to back. EXAM: MRI THORACIC AND LUMBAR SPINE WITHOUT CONTRAST TECHNIQUE: Multiplanar and multiecho pulse sequences of the thoracic and lumbar spine were obtained without intravenous contrast. COMPARISON:  Prior MRI from 08/26/2019. FINDINGS: MRI THORACIC SPINE FINDINGS Alignment: Physiologic with preservation of the normal thoracic kyphosis. No significant listhesis. Vertebrae: Susceptibility artifact related to prior ACDF noted at C5-6 on counter  sequence. Underlying bone marrow signal intensity within normal limits. No worrisome osseous lesions. Multiple acute to subacute compression fractures are seen as follows; 1. T5 compression fracture with up to 50% height loss and trace 2 mm bony retropulsion. 2. T6 compression fracture with up to 35% height loss and trace 2 mm bony retropulsion. 3. T7 compression fracture with 35% height loss without bony retropulsion. 4. T8 compression fracture with up to 50% height loss and 3 mm bony retropulsion. 5. T9 compression fracture with 20% height loss without significant bony retropulsion. 6. T11 compression fracture with mild 35% central height loss without significant bony retropulsion. 7. T12 compression fracture with mild 15% height loss without significant bony retropulsion. Marrow edema involving the left transverse process of T6 likely reflects an acute nondisplaced fracture (a series 18, image 15). Additional chronic T10 compression fracture with mild 30% central height loss without retropulsion noted. No evidence for osteomyelitis discitis within the thoracic spine. Cord:  Normal signal and morphology. Paraspinal and other soft tissues: Mild diffuse edema noted within the subcutaneous fat of the posterior thoracolumbar spine. No loculated collections. Consolidative opacity at the visualized posterior left lung base, which could reflect atelectasis or infiltrate. Disc levels: No significant disc pathology seen within the thoracic spine for age. Trace bony retropulsion about several of the compression fractures as above. No significant spinal stenosis. Foramina remain largely patent. MRI LUMBAR SPINE FINDINGS Segmentation: Transitional features seen about the lumbosacral junction with a partially lumbarized S1 segment. Alignment: Physiologic with preservation of the normal lumbar lordosis. No significant listhesis. Vertebrae: Bone marrow signal intensity within normal limits. No discrete or worrisome osseous  lesions. Several acute to early subacute compression fractures are seen as follows; 1. Compression fracture involving the superior endplate of L2 with 57% central height loss with trace 2 mm bony retropulsion. 2. Compression fracture involving the L3 vertebral body with mild 10% height loss without significant bony retropulsion. 3. Acute to subacute on chronic compression fracture involving the L4 vertebral body with complete central height loss with trace 2 mm bony retropulsion. 4. Compression fracture involving the superior endplate of L5 with mild 20% height loss without bony retropulsion. 5. Compression fracture involving the superior endplate of S1 without significant height loss or bony retropulsion. Conus medullaris and cauda equina: Conus extends to the L1 level. Conus and cauda equina appear normal. Paraspinal and other soft tissues: Soft tissue defect overlying the spinous processes of L1 through L3, compatible with ulceration (series 2, image 6). Diffuse edema within the underlying subcutaneous fat and paraspinous musculature. No discrete or loculated collections. No convincing evidence for osteomyelitis discitis or septic arthritis. Visualized visceral structures within normal limits. Disc levels: L1-2: Negative interspace. Mild facet hypertrophy. No significant stenosis. L2-3: Mild disc bulge with trace bony retropulsion. Mild facet hypertrophy. No significant spinal stenosis. Foramina remain patent. L3-4: Disc bulge with disc desiccation. Superimposed small left foraminal disc protrusion closely approximates the  exiting left L3 nerve root (series 5, image 16). Mild facet hypertrophy. No significant spinal stenosis. Foramina remain patent. L4-5: Mild disc bulge with disc desiccation and trace bony retropulsion. Mild facet hypertrophy. No significant spinal stenosis. Moderate right greater than left L4 foraminal narrowing. L5-S1: Mild diffuse disc bulge. Moderate facet hypertrophy with associated small  joint effusions. No significant spinal stenosis. Moderate right with mild-to-moderate left L5 foraminal narrowing. S1-2: Rudimentary S1-2 interspace. No disc bulge or focal disc herniation. No stenosis. IMPRESSION: 1. Multiple acute to subacute compression fractures involving the T5, T6, T7, T8, T9, T11, T12, L2, L3, L4, L5, and S1 as detailed above. No more than trace bony retropulsion at several levels without significant spinal stenosis. 2. Soft tissue ulceration overlying the lower back at the level of the spinous processes of L1 through L3. Diffuse edema throughout the underlying posterior paraspinous soft tissues without loculated or drainable fluid collection. No convincing evidence for osteomyelitis discitis or septic arthritis within the thoracolumbar spine itself. 3. Mild multilevel lumbar spondylosis without significant spinal stenosis. Moderate bilateral L4 and L5 foraminal narrowing. 4. Patchy consolidative opacity within the posterior left lung base, either atelectasis or infiltrate. Electronically Signed   By: Jeannine Boga M.D.   On: 05/21/2022 20:06   DG Chest Port 1 View  Result Date: 05/21/2022 CLINICAL DATA:  Shortness of breath.  Smoker. EXAM: PORTABLE CHEST 1 VIEW COMPARISON:  05/15/2022 FINDINGS: Normal sized heart. Clear lungs. The lungs remain mildly hyperexpanded. Previously described callus associated with the right posterior 6th rib, compatible with an old, healed fracture. IMPRESSION: No acute abnormality.  Stable mild changes of COPD. Electronically Signed   By: Claudie Revering M.D.   On: 05/21/2022 15:44   CT Angio Chest PE W and/or Wo Contrast  Result Date: 05/15/2022 CLINICAL DATA:  Hemoptysis since yesterday morning, non-small cell lung cancer EXAM: CT ANGIOGRAPHY CHEST WITH CONTRAST TECHNIQUE: Multidetector CT imaging of the chest was performed using the standard protocol during bolus administration of intravenous contrast. Multiplanar CT image reconstructions and MIPs  were obtained to evaluate the vascular anatomy. RADIATION DOSE REDUCTION: This exam was performed according to the departmental dose-optimization program which includes automated exposure control, adjustment of the mA and/or kV according to patient size and/or use of iterative reconstruction technique. CONTRAST:  81mL OMNIPAQUE IOHEXOL 350 MG/ML SOLN COMPARISON:  05/07/2022 FINDINGS: Cardiovascular: This is a technically adequate evaluation of the pulmonary vasculature. There are no filling defects or pulmonary emboli. The heart is unremarkable without pericardial effusion. No evidence of thoracic aortic aneurysm or dissection. Stable atherosclerosis of the aorta and coronary vasculature. Mediastinum/Nodes: No enlarged mediastinal, hilar, or axillary lymph nodes. Thyroid gland, trachea, and esophagus demonstrate no significant findings. Lungs/Pleura: No significant change in the large endobronchial lesion occupying the majority of the left upper lobe bronchial tree. Severe upper lobe predominant emphysema is again noted. No acute airspace disease. Trace left pleural effusion. No pneumothorax. Upper Abdomen: Stable nodular right adrenal thickening measuring 2 x 1 cm, mean attenuation -3 HU, most consistent with adenoma. No acute upper abdominal findings. Musculoskeletal: No acute or destructive bony lesions. Multiple prior healed bilateral rib fractures. Chronic compression deformities at T5, T7, T8, T11, and T12. Reconstructed images demonstrate no additional findings. Review of the MIP images confirms the above findings. IMPRESSION: 1. No evidence of pulmonary embolus. 2. Stable left upper lobe endobronchial mass consistent with known lung cancer. 3. Trace left pleural effusion. 4. Aortic Atherosclerosis (ICD10-I70.0) and Emphysema (ICD10-J43.9). 5. Stable right adrenal nodule, previously  characterized as adenoma. Electronically Signed   By: Randa Ngo M.D.   On: 05/15/2022 18:04   CT Head Wo  Contrast  Result Date: 05/15/2022 CLINICAL DATA:  Mental status change. EXAM: CT HEAD WITHOUT CONTRAST TECHNIQUE: Contiguous axial images were obtained from the base of the skull through the vertex without intravenous contrast. RADIATION DOSE REDUCTION: This exam was performed according to the departmental dose-optimization program which includes automated exposure control, adjustment of the mA and/or kV according to patient size and/or use of iterative reconstruction technique. COMPARISON:  None Available. FINDINGS: Brain: No evidence of acute infarction, hemorrhage, hydrocephalus, extra-axial collection or mass lesion/mass effect. Vascular: No hyperdense vessel or unexpected calcification. Skull: Normal. Negative for fracture or focal lesion. Sinuses/Orbits: Minimal polypoid thickening in the maxillary sinuses Other: None. IMPRESSION: No acute intracranial abnormality. Electronically Signed   By: Fidela Salisbury M.D.   On: 05/15/2022 18:02   DG Chest Portable 1 View  Result Date: 05/15/2022 CLINICAL DATA:  Hemoptysis.  Pain. EXAM: PORTABLE CHEST 1 VIEW COMPARISON:  05/07/2022 CT and 05/06/2022 chest x-ray FINDINGS: Heart size is normal. There is perihilar peribronchial thickening. No new consolidations or pleural effusions. No evidence for pulmonary edema. Remote RIGHT posterior 6th rib fracture. IMPRESSION: No evidence for acute abnormality. Chronic bronchitic changes. Electronically Signed   By: Nolon Nations M.D.   On: 05/15/2022 13:48   CT CHEST ABDOMEN PELVIS W CONTRAST  Result Date: 05/08/2022 CLINICAL DATA:  Non-small cell lung cancer, acute respiratory failure with hypoxia, failure to thrive, nausea/vomiting EXAM: CT CHEST, ABDOMEN, AND PELVIS WITH CONTRAST TECHNIQUE: Multidetector CT imaging of the chest, abdomen and pelvis was performed following the standard protocol during bolus administration of intravenous contrast. RADIATION DOSE REDUCTION: This exam was performed according to the  departmental dose-optimization program which includes automated exposure control, adjustment of the mA and/or kV according to patient size and/or use of iterative reconstruction technique. CONTRAST:  124mL OMNIPAQUE IOHEXOL 300 MG/ML  SOLN COMPARISON:  CT chest dated 03/18/2022. CTA chest abdomen pelvis dated 10/17/2021. FINDINGS: CT CHEST FINDINGS Cardiovascular: No evidence of thoracic aortic aneurysm. Mild atherosclerotic calcifications of the aortic arch. The heart is normal in size.  No pericardial effusion. Coronary atherosclerosis of the LAD and left circumflex. Mediastinum/Nodes: No suspicious mediastinal lymphadenopathy. Visualized thyroid is unremarkable. Lungs/Pleura: Endobronchial soft tissue in the proximal left upper lobe bronchus (series 2/image 34) and extending into the central left upper lobe (series 2/image 28), difficult to discretely measure but measuring approximately 4.9 cm in maximal craniocaudal dimension (coronal image 80) new. This corresponds to the patient's known primary bronchogenic neoplasm and is favored to be mildly progressive. Moderate centrilobular and paraseptal emphysematous changes, upper lung predominant. No focal consolidation. No pleural effusion or pneumothorax. Musculoskeletal: Moderate compression fracture deformity at T5 and T8, grossly unchanged. Mild superior endplate changes at T55. Cervical spine fixation hardware, incompletely visualized. CT ABDOMEN PELVIS FINDINGS Hepatobiliary: Liver is within normal limits. No suspicious/enhancing hepatic lesions. Gallbladder is underdistended but unremarkable. No intrahepatic or extrahepatic duct dilatation. Pancreas: Within normal limits. Spleen: Within normal limits. Adrenals/Urinary Tract: Stable 2.1 cm right adrenal nodule (series 2/image 68), unchanged from remote priors, favoring a benign adrenal adenoma. Left adrenal gland is within normal limits. Kidneys are notable for probable early excretory contrast in the  collecting systems. No hydronephrosis. Excretory contrast in the bladder. Stomach/Bowel: Mildly distended stomach. No evidence of bowel obstruction. Appendix is not discretely visualized. No colonic wall thickening or inflammatory changes. Vascular/Lymphatic: No evidence of abdominal aortic aneurysm. Atherosclerotic  calcifications of the abdominal aorta and branch vessels. No suspicious abdominopelvic lymphadenopathy. Reproductive: Prostate is unremarkable. Other: No abdominopelvic ascites. Musculoskeletal: Moderate compression fracture deformity at L4, unchanged. Mild superior endplate compression fracture deformity at L2 and L5, new. Mild superior endplate changes at L1, unchanged. IMPRESSION: Stable central left upper lobe mass, corresponding to the patient's known primary bronchogenic neoplasm. No findings suspicious for metastatic disease. Stable benign right adrenal adenoma. Multiple thoracolumbar compression fracture deformities, with new mild compression fracture deformities at L2 and L5. Aortic Atherosclerosis (ICD10-I70.0) and Emphysema (ICD10-J43.9). Electronically Signed   By: Julian Hy M.D.   On: 05/08/2022 01:08   MR Brain W Wo Contrast  Result Date: 05/07/2022 EXAM: MRI HEAD WITHOUT AND WITH CONTRAST MRA HEAD WITHOUT CONTRAST TECHNIQUE: Multiplanar, multi-echo pulse sequences of the brain and surrounding structures were acquired without and with intravenous contrast. Angiographic images of the Circle of Willis were acquired using MRA technique without intravenous contrast. CONTRAST:  23mL GADAVIST GADOBUTROL 1 MMOL/ML IV SOLN COMPARISON:  Prior CT from 10/27/2021. FINDINGS: MRI HEAD FINDINGS Brain: Examination moderately degraded by motion artifact. Cerebral volume within normal limits. Patchy T2/FLAIR hyperintensity involving the periventricular deep white matter both cerebral hemispheres as well as the pons, most consistent with chronic small vessel ischemic disease, mild in nature. No  evidence for acute or subacute infarct. Gray-white matter differentiation maintained. No areas of chronic cortical infarction. No acute or chronic intracranial blood products. No mass lesion, midline shift or mass effect no hydrocephalus or extra-axial fluid collection. Pituitary gland suprasellar region normal. No abnormal enhancement. Vascular: Major intracranial vascular flow voids are maintained. Skull and upper cervical spine: Craniocervical junction within normal limits. Bone marrow signal intensity normal. No focal marrow replacing lesion. No scalp soft tissue abnormality. Sinuses/Orbits: Prior bilateral ocular lens replacement. Mild scattered mucosal thickening with a few small retention cysts noted about the ethmoidal air cells and maxillary sinuses. No significant mastoid effusion. Other: None. MRA HEAD FINDINGS Anterior circulation: Examination moderately degraded by motion artifact. Both internal carotid arteries patent to the termini without significant stenosis or other appreciable abnormality. A1 segments patent bilaterally. Grossly normal anterior communicating artery complex. Both ACAs grossly patent to their distal aspects without appreciable stenosis. No visible M1 stenosis or occlusion. No proximal MCA branch occlusion. Distal MCA branches grossly perfused and symmetric. Posterior circulation: Visualized distal V4 segments patent without stenosis. Right vertebral artery dominant. Neither PICA well visualized. Basilar patent to its distal aspect without stenosis. Superior cerebellar and posterior cerebral arteries patent bilaterally with no appreciable significant stenosis. Anatomic variants: None significant. IMPRESSION: MRI HEAD IMPRESSION: 1. Motion artifact degraded exam. 2. No acute intracranial abnormality. No evidence for intracranial metastatic disease. 3. Mild chronic microvascular ischemic disease. MRA HEAD IMPRESSION: 1. Motion degraded exam. 2. Grossly negative intracranial MRA. No  large vessel occlusion, hemodynamically significant stenosis, or other acute vascular abnormality. Electronically Signed   By: Jeannine Boga M.D.   On: 05/07/2022 20:36   MR ANGIO HEAD WO CONTRAST  Result Date: 05/07/2022 EXAM: MRI HEAD WITHOUT AND WITH CONTRAST MRA HEAD WITHOUT CONTRAST TECHNIQUE: Multiplanar, multi-echo pulse sequences of the brain and surrounding structures were acquired without and with intravenous contrast. Angiographic images of the Circle of Willis were acquired using MRA technique without intravenous contrast. CONTRAST:  16mL GADAVIST GADOBUTROL 1 MMOL/ML IV SOLN COMPARISON:  Prior CT from 10/27/2021. FINDINGS: MRI HEAD FINDINGS Brain: Examination moderately degraded by motion artifact. Cerebral volume within normal limits. Patchy T2/FLAIR hyperintensity involving the periventricular deep white matter both  cerebral hemispheres as well as the pons, most consistent with chronic small vessel ischemic disease, mild in nature. No evidence for acute or subacute infarct. Gray-white matter differentiation maintained. No areas of chronic cortical infarction. No acute or chronic intracranial blood products. No mass lesion, midline shift or mass effect no hydrocephalus or extra-axial fluid collection. Pituitary gland suprasellar region normal. No abnormal enhancement. Vascular: Major intracranial vascular flow voids are maintained. Skull and upper cervical spine: Craniocervical junction within normal limits. Bone marrow signal intensity normal. No focal marrow replacing lesion. No scalp soft tissue abnormality. Sinuses/Orbits: Prior bilateral ocular lens replacement. Mild scattered mucosal thickening with a few small retention cysts noted about the ethmoidal air cells and maxillary sinuses. No significant mastoid effusion. Other: None. MRA HEAD FINDINGS Anterior circulation: Examination moderately degraded by motion artifact. Both internal carotid arteries patent to the termini without  significant stenosis or other appreciable abnormality. A1 segments patent bilaterally. Grossly normal anterior communicating artery complex. Both ACAs grossly patent to their distal aspects without appreciable stenosis. No visible M1 stenosis or occlusion. No proximal MCA branch occlusion. Distal MCA branches grossly perfused and symmetric. Posterior circulation: Visualized distal V4 segments patent without stenosis. Right vertebral artery dominant. Neither PICA well visualized. Basilar patent to its distal aspect without stenosis. Superior cerebellar and posterior cerebral arteries patent bilaterally with no appreciable significant stenosis. Anatomic variants: None significant. IMPRESSION: MRI HEAD IMPRESSION: 1. Motion artifact degraded exam. 2. No acute intracranial abnormality. No evidence for intracranial metastatic disease. 3. Mild chronic microvascular ischemic disease. MRA HEAD IMPRESSION: 1. Motion degraded exam. 2. Grossly negative intracranial MRA. No large vessel occlusion, hemodynamically significant stenosis, or other acute vascular abnormality. Electronically Signed   By: Jeannine Boga M.D.   On: 05/07/2022 20:36   DG Chest Port 1 View  Result Date: 05/06/2022 CLINICAL DATA:  Shortness of breath EXAM: PORTABLE CHEST 1 VIEW COMPARISON:  04/29/2022 radiograph and 03/18/2022 chest CT FINDINGS: Chronic hyperinflation and interstitial coarsening. Biapical lucency. Vague rounded density over the right chest correlates with rib fracture callus by CT 2 months ago. Stable appearance of left supra hilum where there is endobronchial mass and airway obstruction by comparison CT. Normal heart size and mediastinal contours. IMPRESSION: COPD without acute superimposed finding. Electronically Signed   By: Jorje Guild M.D.   On: 05/06/2022 04:49   DG Chest Port 1 View  Result Date: 04/29/2022 CLINICAL DATA:  Shortness of breath and hypoxemia. EXAM: PORTABLE CHEST 1 VIEW COMPARISON:  Portable chest  04/22/2022. FINDINGS: The lungs are emphysematous and clear of acute infiltrates. There is scattered linear scarring. Heart size and vasculature are normal with stable mediastinal silhouette. Mild aortic atherosclerosis. Osteopenia. IMPRESSION: Stable COPD chest with no acute cardiopulmonary findings. Electronically Signed   By: Telford Nab M.D.   On: 04/29/2022 03:14    Microbiology: Results for orders placed or performed during the hospital encounter of 05/21/22  Resp Panel by RT-PCR (Flu A&B, Covid) Anterior Nasal Swab     Status: None   Collection Time: 05/21/22  4:00 PM   Specimen: Anterior Nasal Swab  Result Value Ref Range Status   SARS Coronavirus 2 by RT PCR NEGATIVE NEGATIVE Final    Comment: (NOTE) SARS-CoV-2 target nucleic acids are NOT DETECTED.  The SARS-CoV-2 RNA is generally detectable in upper respiratory specimens during the acute phase of infection. The lowest concentration of SARS-CoV-2 viral copies this assay can detect is 138 copies/mL. A negative result does not preclude SARS-Cov-2 infection and should not be used  as the sole basis for treatment or other patient management decisions. A negative result may occur with  improper specimen collection/handling, submission of specimen other than nasopharyngeal swab, presence of viral mutation(s) within the areas targeted by this assay, and inadequate number of viral copies(<138 copies/mL). A negative result must be combined with clinical observations, patient history, and epidemiological information. The expected result is Negative.  Fact Sheet for Patients:  EntrepreneurPulse.com.au  Fact Sheet for Healthcare Providers:  IncredibleEmployment.be  This test is no t yet approved or cleared by the [place] FDA and  has been authorized for detection and/or diagnosis of SARS-CoV-2 by FDA under an Emergency Use Authorization (EUA). This EUA will remain  in effect (meaning this  test can be used) for the duration of the COVID-19 declaration under Section 564(b)(1) of the Act, 21 U.S.C.section 360bbb-3(b)(1), unless the authorization is terminated  or revoked sooner.       Influenza A by PCR NEGATIVE NEGATIVE Final   Influenza B by PCR NEGATIVE NEGATIVE Final    Comment: (NOTE) The Xpert Xpress SARS-CoV-2/FLU/RSV plus assay is intended as an aid in the diagnosis of influenza from Nasopharyngeal swab specimens and should not be used as a sole basis for treatment. Nasal washings and aspirates are unacceptable for Xpert Xpress SARS-CoV-2/FLU/RSV testing.  Fact Sheet for Patients: EntrepreneurPulse.com.au  Fact Sheet for Healthcare Providers: IncredibleEmployment.be  This test is not yet approved or cleared by the [place] FDA and has been authorized for detection and/or diagnosis of SARS-CoV-2 by FDA under an Emergency Use Authorization (EUA). This EUA will remain in effect (meaning this test can be used) for the duration of the COVID-19 declaration under Section 564(b)(1) of the Act, 21 U.S.C. section 360bbb-3(b)(1), unless the authorization is terminated or revoked.  Performed at [facility], [address], [place], [place] [address]   MRSA Next Gen by PCR, Nasal     Status: None   Collection Time: 05/21/22  8:06 PM   Specimen: Nasal Mucosa; Nasal Swab  Result Value Ref Range Status   MRSA by PCR Next Gen NOT DETECTED NOT DETECTED Final    Comment: (NOTE) The GeneXpert MRSA Assay (FDA approved for NASAL specimens only), is one component of a comprehensive MRSA colonization surveillance program. It is not intended to diagnose MRSA infection nor to guide or monitor treatment for MRSA infections. Test performance is not FDA approved in patients less than 33 years old. Performed at [facility], [address] [place], [place] [address]     Labs: CBC: Recent Labs  Lab 05/22/22 0634 05/25/22 0512  WBC  11.5* 15.2*  HGB 10.4* 10.1*  HCT 35.2* 33.8*  MCV 105.1* 100.9*  PLT 239 785   Basic Metabolic Panel: Recent Labs  Lab 05/22/22 0634 05/24/22 0524 05/25/22 0512 05/27/22 0527  NA 144  --  140 142  K 4.1  --  3.8 3.5  CL 102  --  95* 93*  CO2 34*  --  39* 40*  GLUCOSE 115*  --  113* 104*  BUN 17  --  14 13  CREATININE 0.78 0.55* 0.53* 0.50*  CALCIUM 9.0  --  8.9 8.7*  MG  --   --  1.9 2.0  PHOS  --   --  1.9* 2.9   Liver Function Tests: No results for input(s): "AST", "ALT", "ALKPHOS", "BILITOT", "PROT", "ALBUMIN" in the last 168 hours. CBG: Recent Labs  Lab 05/28/22 0055  GLUCAP 153*    Discharge time spent: greater than  30 minutes.  Signed: Orson Eva, MD [facility] 05/28/2022

## 2022-05-30 ENCOUNTER — Telehealth: Payer: Self-pay | Admitting: Nurse Practitioner

## 2022-05-30 NOTE — Telephone Encounter (Signed)
Spoke with patient and have scheduled an In-home Palliative visit for 06/03/22 @ 12:30 PM

## 2022-05-31 ENCOUNTER — Emergency Department (HOSPITAL_COMMUNITY): Payer: Medicaid Other

## 2022-05-31 ENCOUNTER — Inpatient Hospital Stay (HOSPITAL_COMMUNITY)
Admission: EM | Admit: 2022-05-31 | Discharge: 2022-06-12 | DRG: 871 | Disposition: A | Discharge status: Hospice | Payer: Medicaid Other | Attending: Internal Medicine | Admitting: Internal Medicine

## 2022-05-31 ENCOUNTER — Encounter (HOSPITAL_COMMUNITY): Payer: Self-pay | Admitting: *Deleted

## 2022-05-31 ENCOUNTER — Other Ambulatory Visit: Payer: Self-pay

## 2022-05-31 DIAGNOSIS — R6520 Severe sepsis without septic shock: Secondary | ICD-10-CM | POA: Diagnosis present

## 2022-05-31 DIAGNOSIS — R7303 Prediabetes: Secondary | ICD-10-CM | POA: Diagnosis present

## 2022-05-31 DIAGNOSIS — G9340 Encephalopathy, unspecified: Secondary | ICD-10-CM | POA: Diagnosis not present

## 2022-05-31 DIAGNOSIS — Z888 Allergy status to other drugs, medicaments and biological substances status: Secondary | ICD-10-CM

## 2022-05-31 DIAGNOSIS — F1410 Cocaine abuse, uncomplicated: Secondary | ICD-10-CM | POA: Diagnosis present

## 2022-05-31 DIAGNOSIS — E441 Mild protein-calorie malnutrition: Secondary | ICD-10-CM | POA: Diagnosis present

## 2022-05-31 DIAGNOSIS — Z9981 Dependence on supplemental oxygen: Secondary | ICD-10-CM

## 2022-05-31 DIAGNOSIS — Z825 Family history of asthma and other chronic lower respiratory diseases: Secondary | ICD-10-CM

## 2022-05-31 DIAGNOSIS — F1010 Alcohol abuse, uncomplicated: Secondary | ICD-10-CM | POA: Diagnosis present

## 2022-05-31 DIAGNOSIS — T68XXXA Hypothermia, initial encounter: Secondary | ICD-10-CM | POA: Diagnosis not present

## 2022-05-31 DIAGNOSIS — Z86711 Personal history of pulmonary embolism: Secondary | ICD-10-CM | POA: Diagnosis not present

## 2022-05-31 DIAGNOSIS — Z91199 Patient's noncompliance with other medical treatment and regimen due to unspecified reason: Secondary | ICD-10-CM

## 2022-05-31 DIAGNOSIS — R4182 Altered mental status, unspecified: Secondary | ICD-10-CM | POA: Diagnosis present

## 2022-05-31 DIAGNOSIS — S81812A Laceration without foreign body, left lower leg, initial encounter: Secondary | ICD-10-CM | POA: Diagnosis present

## 2022-05-31 DIAGNOSIS — Z681 Body mass index (BMI) 19 or less, adult: Secondary | ICD-10-CM | POA: Diagnosis not present

## 2022-05-31 DIAGNOSIS — C3490 Malignant neoplasm of unspecified part of unspecified bronchus or lung: Secondary | ICD-10-CM | POA: Diagnosis present

## 2022-05-31 DIAGNOSIS — L89152 Pressure ulcer of sacral region, stage 2: Secondary | ICD-10-CM | POA: Diagnosis present

## 2022-05-31 DIAGNOSIS — L89102 Pressure ulcer of unspecified part of back, stage 2: Secondary | ICD-10-CM | POA: Diagnosis present

## 2022-05-31 DIAGNOSIS — I110 Hypertensive heart disease with heart failure: Secondary | ICD-10-CM | POA: Diagnosis present

## 2022-05-31 DIAGNOSIS — W052XXA Fall from non-moving motorized mobility scooter, initial encounter: Secondary | ICD-10-CM | POA: Diagnosis present

## 2022-05-31 DIAGNOSIS — B377 Candidal sepsis: Secondary | ICD-10-CM | POA: Diagnosis present

## 2022-05-31 DIAGNOSIS — I5033 Acute on chronic diastolic (congestive) heart failure: Secondary | ICD-10-CM | POA: Diagnosis present

## 2022-05-31 DIAGNOSIS — K219 Gastro-esophageal reflux disease without esophagitis: Secondary | ICD-10-CM | POA: Diagnosis not present

## 2022-05-31 DIAGNOSIS — I2699 Other pulmonary embolism without acute cor pulmonale: Secondary | ICD-10-CM | POA: Diagnosis present

## 2022-05-31 DIAGNOSIS — Z809 Family history of malignant neoplasm, unspecified: Secondary | ICD-10-CM

## 2022-05-31 DIAGNOSIS — Z20822 Contact with and (suspected) exposure to covid-19: Secondary | ICD-10-CM | POA: Diagnosis present

## 2022-05-31 DIAGNOSIS — J9622 Acute and chronic respiratory failure with hypercapnia: Secondary | ICD-10-CM | POA: Diagnosis present

## 2022-05-31 DIAGNOSIS — J9811 Atelectasis: Secondary | ICD-10-CM | POA: Diagnosis not present

## 2022-05-31 DIAGNOSIS — R7881 Bacteremia: Secondary | ICD-10-CM | POA: Diagnosis not present

## 2022-05-31 DIAGNOSIS — G928 Other toxic encephalopathy: Secondary | ICD-10-CM | POA: Diagnosis present

## 2022-05-31 DIAGNOSIS — J9621 Acute and chronic respiratory failure with hypoxia: Secondary | ICD-10-CM | POA: Diagnosis present

## 2022-05-31 DIAGNOSIS — J449 Chronic obstructive pulmonary disease, unspecified: Secondary | ICD-10-CM

## 2022-05-31 DIAGNOSIS — Z7951 Long term (current) use of inhaled steroids: Secondary | ICD-10-CM

## 2022-05-31 DIAGNOSIS — T17890A Other foreign object in other parts of respiratory tract causing asphyxiation, initial encounter: Secondary | ICD-10-CM | POA: Diagnosis present

## 2022-05-31 DIAGNOSIS — C3412 Malignant neoplasm of upper lobe, left bronchus or lung: Secondary | ICD-10-CM | POA: Diagnosis present

## 2022-05-31 DIAGNOSIS — Z7189 Other specified counseling: Secondary | ICD-10-CM | POA: Diagnosis not present

## 2022-05-31 DIAGNOSIS — I2694 Multiple subsegmental pulmonary emboli without acute cor pulmonale: Secondary | ICD-10-CM | POA: Diagnosis not present

## 2022-05-31 DIAGNOSIS — L89100 Pressure ulcer of unspecified part of back, unstageable: Secondary | ICD-10-CM | POA: Diagnosis present

## 2022-05-31 DIAGNOSIS — C3492 Malignant neoplasm of unspecified part of left bronchus or lung: Secondary | ICD-10-CM | POA: Diagnosis not present

## 2022-05-31 DIAGNOSIS — S81802A Unspecified open wound, left lower leg, initial encounter: Secondary | ICD-10-CM

## 2022-05-31 DIAGNOSIS — M1990 Unspecified osteoarthritis, unspecified site: Secondary | ICD-10-CM | POA: Diagnosis present

## 2022-05-31 DIAGNOSIS — Z720 Tobacco use: Secondary | ICD-10-CM | POA: Diagnosis present

## 2022-05-31 DIAGNOSIS — F1110 Opioid abuse, uncomplicated: Secondary | ICD-10-CM | POA: Diagnosis present

## 2022-05-31 DIAGNOSIS — Z832 Family history of diseases of the blood and blood-forming organs and certain disorders involving the immune mechanism: Secondary | ICD-10-CM

## 2022-05-31 DIAGNOSIS — Z833 Family history of diabetes mellitus: Secondary | ICD-10-CM

## 2022-05-31 DIAGNOSIS — F419 Anxiety disorder, unspecified: Secondary | ICD-10-CM | POA: Diagnosis present

## 2022-05-31 DIAGNOSIS — F319 Bipolar disorder, unspecified: Secondary | ICD-10-CM | POA: Diagnosis present

## 2022-05-31 DIAGNOSIS — Z8249 Family history of ischemic heart disease and other diseases of the circulatory system: Secondary | ICD-10-CM

## 2022-05-31 DIAGNOSIS — J439 Emphysema, unspecified: Secondary | ICD-10-CM | POA: Diagnosis present

## 2022-05-31 DIAGNOSIS — Z79899 Other long term (current) drug therapy: Secondary | ICD-10-CM

## 2022-05-31 DIAGNOSIS — F17210 Nicotine dependence, cigarettes, uncomplicated: Secondary | ICD-10-CM | POA: Diagnosis present

## 2022-05-31 DIAGNOSIS — I10 Essential (primary) hypertension: Secondary | ICD-10-CM | POA: Diagnosis not present

## 2022-05-31 DIAGNOSIS — Z7952 Long term (current) use of systemic steroids: Secondary | ICD-10-CM

## 2022-05-31 DIAGNOSIS — Z515 Encounter for palliative care: Secondary | ICD-10-CM

## 2022-05-31 DIAGNOSIS — Z66 Do not resuscitate: Secondary | ICD-10-CM | POA: Diagnosis not present

## 2022-05-31 DIAGNOSIS — Z981 Arthrodesis status: Secondary | ICD-10-CM

## 2022-05-31 DIAGNOSIS — Z7901 Long term (current) use of anticoagulants: Secondary | ICD-10-CM

## 2022-05-31 DIAGNOSIS — F1910 Other psychoactive substance abuse, uncomplicated: Secondary | ICD-10-CM

## 2022-05-31 DIAGNOSIS — J441 Chronic obstructive pulmonary disease with (acute) exacerbation: Secondary | ICD-10-CM | POA: Diagnosis not present

## 2022-05-31 DIAGNOSIS — H1133 Conjunctival hemorrhage, bilateral: Secondary | ICD-10-CM | POA: Diagnosis present

## 2022-05-31 LAB — CBC WITH DIFFERENTIAL/PLATELET
Abs Immature Granulocytes: 0.24 10*3/uL — ABNORMAL HIGH (ref 0.00–0.07)
Basophils Absolute: 0 10*3/uL (ref 0.0–0.1)
Basophils Relative: 0 %
Eosinophils Absolute: 0 10*3/uL (ref 0.0–0.5)
Eosinophils Relative: 0 %
HCT: 37.4 % — ABNORMAL LOW (ref 39.0–52.0)
Hemoglobin: 10.7 g/dL — ABNORMAL LOW (ref 13.0–17.0)
Immature Granulocytes: 2 %
Lymphocytes Relative: 2 %
Lymphs Abs: 0.3 10*3/uL — ABNORMAL LOW (ref 0.7–4.0)
MCH: 30.3 pg (ref 26.0–34.0)
MCHC: 28.6 g/dL — ABNORMAL LOW (ref 30.0–36.0)
MCV: 105.9 fL — ABNORMAL HIGH (ref 80.0–100.0)
Monocytes Absolute: 1 10*3/uL (ref 0.1–1.0)
Monocytes Relative: 6 %
Neutro Abs: 14.1 10*3/uL — ABNORMAL HIGH (ref 1.7–7.7)
Neutrophils Relative %: 90 %
Platelets: 237 10*3/uL (ref 150–400)
RBC: 3.53 MIL/uL — ABNORMAL LOW (ref 4.22–5.81)
RDW: 16.1 % — ABNORMAL HIGH (ref 11.5–15.5)
WBC: 15.6 10*3/uL — ABNORMAL HIGH (ref 4.0–10.5)
nRBC: 0 % (ref 0.0–0.2)

## 2022-05-31 LAB — COMPREHENSIVE METABOLIC PANEL
ALT: 31 U/L (ref 0–44)
AST: 33 U/L (ref 15–41)
Albumin: 2.7 g/dL — ABNORMAL LOW (ref 3.5–5.0)
Alkaline Phosphatase: 129 U/L — ABNORMAL HIGH (ref 38–126)
Anion gap: 12 (ref 5–15)
BUN: 40 mg/dL — ABNORMAL HIGH (ref 6–20)
CO2: 38 mmol/L — ABNORMAL HIGH (ref 22–32)
Calcium: 9.2 mg/dL (ref 8.9–10.3)
Chloride: 93 mmol/L — ABNORMAL LOW (ref 98–111)
Creatinine, Ser: 0.9 mg/dL (ref 0.61–1.24)
GFR, Estimated: >60 mL/min (ref >60)
Glucose, Bld: 83 mg/dL (ref 70–99)
Potassium: 4.3 mmol/L (ref 3.5–5.1)
Sodium: 143 mmol/L (ref 135–145)
Total Bilirubin: 1.2 mg/dL (ref 0.3–1.2)
Total Protein: 5.9 g/dL — ABNORMAL LOW (ref 6.5–8.1)

## 2022-05-31 LAB — URINALYSIS, ROUTINE W REFLEX MICROSCOPIC
Bilirubin Urine: NEGATIVE
Glucose, UA: NEGATIVE mg/dL
Hgb urine dipstick: NEGATIVE
Ketones, ur: 5 mg/dL — AB
Leukocytes,Ua: NEGATIVE
Nitrite: NEGATIVE
Protein, ur: 30 mg/dL — AB
Specific Gravity, Urine: 1.017 (ref 1.005–1.030)
pH: 5 (ref 5.0–8.0)

## 2022-05-31 LAB — RESP PANEL BY RT-PCR (FLU A&B, COVID) ARPGX2
Influenza A by PCR: NEGATIVE
Influenza B by PCR: NEGATIVE
SARS Coronavirus 2 by RT PCR: NEGATIVE

## 2022-05-31 LAB — LACTIC ACID, PLASMA
Lactic Acid, Venous: 3.7 mmol/L (ref 0.5–1.9)
Lactic Acid, Venous: 1.3 mmol/L (ref 0.5–1.9)

## 2022-05-31 LAB — GLUCOSE, CAPILLARY: Glucose-Capillary: 72 mg/dL (ref 70–99)

## 2022-05-31 LAB — RAPID URINE DRUG SCREEN, HOSP PERFORMED
Amphetamines: NOT DETECTED
Barbiturates: NOT DETECTED
Benzodiazepines: NOT DETECTED
Cocaine: POSITIVE — AB
Opiates: POSITIVE — AB
Tetrahydrocannabinol: NOT DETECTED

## 2022-05-31 LAB — APTT: aPTT: 27 seconds (ref 24–36)

## 2022-05-31 LAB — CK: Total CK: 91 U/L (ref 49–397)

## 2022-05-31 LAB — CBG MONITORING, ED: Glucose-Capillary: 71 mg/dL (ref 70–99)

## 2022-05-31 LAB — PROTIME-INR
INR: 1.2 (ref 0.8–1.2)
Prothrombin Time: 14.7 seconds (ref 11.4–15.2)

## 2022-05-31 LAB — ETHANOL: Alcohol, Ethyl (B): <10 mg/dL (ref <10)

## 2022-05-31 MED ORDER — VANCOMYCIN HCL 750 MG/150ML IV SOLN
750.0000 mg | Freq: Two times a day (BID) | INTRAVENOUS | Status: DC
  Administered 2022-06-01 – 2022-06-02 (×4): 750 mg via INTRAVENOUS
  Filled 2022-05-31 (×6): qty 150

## 2022-05-31 MED ORDER — DORZOLAMIDE HCL 2 % OP SOLN
1.0000 [drp] | Freq: Two times a day (BID) | OPHTHALMIC | Status: DC
  Administered 2022-06-01 – 2022-06-12 (×23): 1 [drp] via OPHTHALMIC
  Filled 2022-05-31 (×3): qty 10

## 2022-05-31 MED ORDER — SODIUM CHLORIDE 0.9 % IV SOLN
500.0000 mg | INTRAVENOUS | Status: DC
  Administered 2022-06-01 – 2022-06-04 (×5): 500 mg via INTRAVENOUS
  Filled 2022-05-31 (×6): qty 5

## 2022-05-31 MED ORDER — SODIUM CHLORIDE 0.9 % IV SOLN
2.0000 g | Freq: Once | INTRAVENOUS | Status: AC
  Administered 2022-05-31: 2 g via INTRAVENOUS
  Filled 2022-05-31: qty 12.5

## 2022-05-31 MED ORDER — ONDANSETRON HCL 4 MG PO TABS: 4.0000 mg | ORAL_TABLET | Freq: Four times a day (QID) | ORAL | Status: DC | PRN

## 2022-05-31 MED ORDER — SODIUM CHLORIDE 0.9 % IV SOLN: 2.0000 g | Freq: Once | INTRAVENOUS | Status: DC

## 2022-05-31 MED ORDER — LATANOPROST 0.005 % OP SOLN
1.0000 [drp] | Freq: Every day | OPHTHALMIC | Status: DC
Start: 2022-06-01 — End: 2022-06-12
  Administered 2022-06-01 – 2022-06-11 (×12): 1 [drp] via OPHTHALMIC
  Filled 2022-05-31 (×2): qty 2.5

## 2022-05-31 MED ORDER — VANCOMYCIN HCL IN DEXTROSE 1-5 GM/200ML-% IV SOLN
1000.0000 mg | Freq: Once | INTRAVENOUS | Status: AC
  Administered 2022-05-31: 1000 mg via INTRAVENOUS
  Filled 2022-05-31: qty 200

## 2022-05-31 MED ORDER — LACTATED RINGERS IV SOLN
INTRAVENOUS | Status: DC
  Administered 2022-05-31: 150 mL via INTRAVENOUS

## 2022-05-31 MED ORDER — POVIDONE-IODINE 10 % EX SOLN
CUTANEOUS | Status: AC
  Filled 2022-05-31: qty 14.8

## 2022-05-31 MED ORDER — ENOXAPARIN SODIUM 60 MG/0.6ML IJ SOSY
60.0000 mg | PREFILLED_SYRINGE | Freq: Two times a day (BID) | INTRAMUSCULAR | Status: DC
  Administered 2022-06-01 – 2022-06-04 (×8): 60 mg via SUBCUTANEOUS
  Filled 2022-05-31 (×8): qty 0.6

## 2022-05-31 MED ORDER — CEFEPIME HCL 2 G IV SOLR
2.0000 g | Freq: Three times a day (TID) | INTRAVENOUS | Status: DC
  Administered 2022-06-01 – 2022-06-03 (×7): 2 g via INTRAVENOUS
  Filled 2022-05-31 (×7): qty 12.5

## 2022-05-31 MED ORDER — SODIUM CHLORIDE 0.9 % IV BOLUS (SEPSIS)
1000.0000 mL | Freq: Once | INTRAVENOUS | Status: AC
  Administered 2022-05-31: 1000 mL via INTRAVENOUS

## 2022-05-31 MED ORDER — ONDANSETRON HCL 4 MG/2ML IJ SOLN: 4.0000 mg | Freq: Four times a day (QID) | INTRAMUSCULAR | Status: DC | PRN

## 2022-05-31 NOTE — ED Notes (Signed)
Patient transported to CT 

## 2022-05-31 NOTE — Progress Notes (Addendum)
Pharmacy Antibiotic Note  Tony Sullivan is a 61 y.o. male admitted on 05/31/2022 with pneumonia.  Pharmacy has been consulted for vanc dosing.  Pt  with a hx of chronic resp failure and NSCLC who was recently discharge from [facility] for COPD exacerbation. He presented back with AMS after found on the floor. Abx have been ordered for PNA. He has an iffy PCN allergy. Messaged Dr. Sabra Heck to consider using cefepime instead of aztreonam since the risk of crossreactivity is minimal (<1%).   Addendum  Ok to try cefepime instead per Dr. Sabra Heck  Scr 0.5 7/31>>0.9 8/4   Plan: Vanc 1g IV x1 then 750mg  IV q12>>AUC 428, scr 0.9 Cefepime 2g IV x1 then q8 MRSA PCR Level as needed  Height: 5\' 11"  (180.3 cm) Weight: 58.8 kg (129 lb 10.1 oz) IBW/kg (Calculated) : 75.3  No data recorded.  Recent Labs  Lab 05/25/22 0512 05/27/22 0527  WBC 15.2*  --   CREATININE 0.53* 0.50*    Estimated Creatinine Clearance: 82.7 mL/min (A) (by C-G formula based on SCr of 0.5 mg/dL (L)).    Allergies  Allergen Reactions   Penicillins Anaphylaxis    Has patient had a PCN reaction causing immediate rash, facial/tongue/throat swelling, SOB or lightheadedness with hypotension: Yes Has patient had a PCN reaction causing severe rash involving mucus membranes or skin necrosis: No Has patient had a PCN reaction that required hospitalization No Has patient had a PCN reaction occurring within the last 10 years: No If all of the above answers are "NO", then may proceed with Cephalosporin use.    Levocetirizine Other (See Comments)    Muscle cramps    Antimicrobials this admission: 8/4 vanc>> 8/4 cefepime>>  Dose adjustments this admission:   Microbiology results: 8/4 blood>> 8/4 urine>> MRSA PCR>>pend  Onnie Boer, PharmD, BCIDP, AAHIVP, CPP Infectious Disease Pharmacist 05/31/2022 5:47 PM
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
## 2022-05-31 NOTE — ED Triage Notes (Signed)
Pt brought in by [facility] from home with c/o AMS, found on the floor appeared to have fallen out of his hover chair and was laying underneath his hospital bed. Lady at the house reports pt has been down approximately 3-4 hours. Initial vitals upon EMS arrival - O2 sat 72% on RA, CBG 97, BP 118/61, resp 6-8. Pt placed on 15L NRB with no increase in O2 sat. EMS reports pt started to arouse after being on the oxygen en route to the hospital. Pt's foley catheter had also been ripped out when EMS found him. Blood was present on the sheets from bleeding wound to left shin.

## 2022-05-31 NOTE — Progress Notes (Signed)
ANTICOAGULATION CONSULT NOTE - Initial Consult  Pharmacy Consult for Lovenox Indication: pulmonary embolus  Allergies  Allergen Reactions   Penicillins Anaphylaxis    Has patient had a PCN reaction causing immediate rash, facial/tongue/throat swelling, SOB or lightheadedness with hypotension: Yes Has patient had a PCN reaction causing severe rash involving mucus membranes or skin necrosis: No Has patient had a PCN reaction that required hospitalization No Has patient had a PCN reaction occurring within the last 10 years: No If all of the above answers are "NO", then may proceed with Cephalosporin use. Cephalosporins ok   Levocetirizine Other (See Comments)    Muscle cramps    Patient Measurements: Height: 5\' 11"  (180.3 cm) Weight: 59.2 kg (130 lb 8.2 oz) (actual) IBW/kg (Calculated) : 75.3 Heparin Dosing Weight:   Vital Signs: Temp: 94.9 F (34.9 C) (08/04 2022) Temp Source: Rectal (08/04 2022) BP: 124/73 (08/04 2100) Pulse Rate: 97 (08/04 2100)  Labs: Recent Labs    05/31/22 1815  HGB 10.7*  HCT 37.4*  PLT 237  APTT 27  LABPROT 14.7  INR 1.2  CREATININE 0.90  CKTOTAL 91    Estimated Creatinine Clearance: 74 mL/min (by C-G formula based on SCr of 0.9 mg/dL).   Medical History: Past Medical History:  Diagnosis Date   Anxiety    Arthritis    COPD (chronic obstructive pulmonary disease) ([place]) 2004   diagnosed in 2004, no PFT's to date.  Started on home O2 12/2013, after found to be desatting at PCP's office, and referred to pulmonology.   Depression    GERD (gastroesophageal reflux disease)    High blood pressure    Prediabetes    Seasonal allergies    Substance abuse (HCC)    Tobacco dependence     Medications:  (Not in a hospital admission)  Scheduled:   [START ON 06/01/2022] enoxaparin (LOVENOX) injection  60 mg Subcutaneous BID   Infusions:   [START ON 06/01/2022] ceFEPime (MAXIPIME) IV     lactated ringers 150 mL (05/31/22 1817)   thiamine (VITAMIN  B1) injection     [START ON 06/01/2022] vancomycin      Assessment: Pt with a hx of PE who is on apixaban for anticoagulation. He is currently NPO. We will bridge with Lovenox starting tomorrow per MD's request.   Goal of Therapy:  Anti-Xa level 0.6-1 units/ml 4hrs after LMWH dose given Monitor platelets by anticoagulation protocol: Yes   Plan:  Lovenox 60mg  SQ BID starting 8/5 F/u resume apixaban  Onnie Boer, PharmD, BCIDP, AAHIVP, CPP Infectious Disease Pharmacist 05/31/2022 9:33 PM
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
## 2022-05-31 NOTE — Sepsis Progress Note (Signed)
Sepsis protocol monitored by eLink 
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
## 2022-06-01 DIAGNOSIS — G9340 Encephalopathy, unspecified: Secondary | ICD-10-CM | POA: Diagnosis not present

## 2022-06-01 LAB — CBC
HCT: 31.9 % — ABNORMAL LOW (ref 39.0–52.0)
Hemoglobin: 9.4 g/dL — ABNORMAL LOW (ref 13.0–17.0)
MCH: 31.1 pg (ref 26.0–34.0)
MCHC: 29.5 g/dL — ABNORMAL LOW (ref 30.0–36.0)
MCV: 105.6 fL — ABNORMAL HIGH (ref 80.0–100.0)
Platelets: 203 10*3/uL (ref 150–400)
RBC: 3.02 MIL/uL — ABNORMAL LOW (ref 4.22–5.81)
RDW: 16.3 % — ABNORMAL HIGH (ref 11.5–15.5)
WBC: 11.5 10*3/uL — ABNORMAL HIGH (ref 4.0–10.5)
nRBC: 0 % (ref 0.0–0.2)

## 2022-06-01 LAB — IRON AND TIBC
Iron: 26 ug/dL — ABNORMAL LOW (ref 45–182)
Saturation Ratios: 20 % (ref 17.9–39.5)
TIBC: 129 ug/dL — ABNORMAL LOW (ref 250–450)
UIBC: 103 ug/dL

## 2022-06-01 LAB — BASIC METABOLIC PANEL
Anion gap: 10 (ref 5–15)
BUN: 30 mg/dL — ABNORMAL HIGH (ref 6–20)
CO2: 34 mmol/L — ABNORMAL HIGH (ref 22–32)
Calcium: 8.5 mg/dL — ABNORMAL LOW (ref 8.9–10.3)
Chloride: 102 mmol/L (ref 98–111)
Creatinine, Ser: 0.67 mg/dL (ref 0.61–1.24)
GFR, Estimated: >60 mL/min (ref >60)
Glucose, Bld: 54 mg/dL — ABNORMAL LOW (ref 70–99)
Potassium: 3.9 mmol/L (ref 3.5–5.1)
Sodium: 146 mmol/L — ABNORMAL HIGH (ref 135–145)

## 2022-06-01 LAB — FOLATE: Folate: 8.9 ng/mL (ref >5.9)

## 2022-06-01 LAB — BLOOD GAS, ARTERIAL
Acid-Base Excess: 13.6 mmol/L — ABNORMAL HIGH (ref 0.0–2.0)
Bicarbonate: 40.3 mmol/L — ABNORMAL HIGH (ref 20.0–28.0)
Drawn by: 22179
FIO2: 40 %
O2 Saturation: 94.3 %
Patient temperature: 36.5
pCO2 arterial: 57 mmHg — ABNORMAL HIGH (ref 32–48)
pH, Arterial: 7.46 — ABNORMAL HIGH (ref 7.35–7.45)
pO2, Arterial: 60 mmHg — ABNORMAL LOW (ref 83–108)

## 2022-06-01 LAB — MAGNESIUM: Magnesium: 2.1 mg/dL (ref 1.7–2.4)

## 2022-06-01 LAB — GLUCOSE, CAPILLARY
Glucose-Capillary: 100 mg/dL — ABNORMAL HIGH (ref 70–99)
Glucose-Capillary: 55 mg/dL — ABNORMAL LOW (ref 70–99)
Glucose-Capillary: 126 mg/dL — ABNORMAL HIGH (ref 70–99)
Glucose-Capillary: 129 mg/dL — ABNORMAL HIGH (ref 70–99)

## 2022-06-01 LAB — MRSA NEXT GEN BY PCR, NASAL: MRSA by PCR Next Gen: NOT DETECTED

## 2022-06-01 LAB — PHOSPHORUS: Phosphorus: 2.9 mg/dL (ref 2.5–4.6)

## 2022-06-01 LAB — VITAMIN D 25 HYDROXY (VIT D DEFICIENCY, FRACTURES): Vit D, 25-Hydroxy: 19.35 ng/mL — ABNORMAL LOW (ref 30–100)

## 2022-06-01 MED ORDER — CHLORHEXIDINE GLUCONATE CLOTH 2 % EX PADS
6.0000 | MEDICATED_PAD | Freq: Every day | CUTANEOUS | Status: DC
  Administered 2022-06-01 – 2022-06-12 (×11): 6 via TOPICAL

## 2022-06-01 MED ORDER — UMECLIDINIUM BROMIDE 62.5 MCG/ACT IN AEPB
1.0000 | INHALATION_SPRAY | Freq: Every day | RESPIRATORY_TRACT | Status: DC
  Administered 2022-06-01 – 2022-06-02 (×2): 1 via RESPIRATORY_TRACT
  Filled 2022-06-01 (×2): qty 7

## 2022-06-01 MED ORDER — BUDESON-GLYCOPYRROL-FORMOTEROL 160-9-4.8 MCG/ACT IN AERO: 2.0000 | INHALATION_SPRAY | Freq: Every day | RESPIRATORY_TRACT | Status: DC

## 2022-06-01 MED ORDER — FLUTICASONE FUROATE-VILANTEROL 200-25 MCG/ACT IN AEPB
1.0000 | INHALATION_SPRAY | Freq: Every day | RESPIRATORY_TRACT | Status: DC
  Administered 2022-06-01 – 2022-06-02 (×2): 1 via RESPIRATORY_TRACT
  Filled 2022-06-01 (×2): qty 28

## 2022-06-01 MED ORDER — METOPROLOL TARTRATE 5 MG/5ML IV SOLN
5.0000 mg | Freq: Once | INTRAVENOUS | Status: AC
  Administered 2022-06-01: 5 mg via INTRAVENOUS
  Filled 2022-06-01: qty 5

## 2022-06-01 MED ORDER — METOPROLOL TARTRATE 5 MG/5ML IV SOLN
5.0000 mg | Freq: Four times a day (QID) | INTRAVENOUS | Status: DC | PRN
Start: 2022-06-01 — End: 2022-06-12

## 2022-06-01 MED ORDER — ACETAMINOPHEN 650 MG RE SUPP: 650.0000 mg | Freq: Four times a day (QID) | RECTAL | Status: DC | PRN

## 2022-06-01 MED ORDER — DEXTROSE 50 % IV SOLN
12.5000 g | INTRAVENOUS | Status: AC
  Administered 2022-06-01: 12.5 g via INTRAVENOUS

## 2022-06-01 MED ORDER — ACETAMINOPHEN 325 MG PO TABS
650.0000 mg | ORAL_TABLET | Freq: Four times a day (QID) | ORAL | Status: DC | PRN
  Administered 2022-06-06 – 2022-06-09 (×2): 650 mg via ORAL
  Filled 2022-06-01 (×2): qty 2

## 2022-06-01 MED ORDER — POLYSACCHARIDE IRON COMPLEX 150 MG PO CAPS
150.0000 mg | ORAL_CAPSULE | Freq: Every day | ORAL | Status: DC
  Administered 2022-06-01 – 2022-06-12 (×11): 150 mg via ORAL
  Filled 2022-06-01 (×11): qty 1

## 2022-06-01 MED ORDER — VITAMIN D (ERGOCALCIFEROL) 1.25 MG (50000 UNIT) PO CAPS
50000.0000 [IU] | ORAL_CAPSULE | ORAL | Status: DC
  Administered 2022-06-01 – 2022-06-08 (×2): 50000 [IU] via ORAL
  Filled 2022-06-01 (×2): qty 1

## 2022-06-01 MED ORDER — HYDRALAZINE HCL 20 MG/ML IJ SOLN: 10.0000 mg | Freq: Four times a day (QID) | INTRAMUSCULAR | Status: DC | PRN

## 2022-06-01 MED ORDER — KCL IN DEXTROSE-NACL 20-5-0.9 MEQ/L-%-% IV SOLN: INTRAVENOUS | Status: DC

## 2022-06-01 MED ORDER — THIAMINE HCL 100 MG/ML IJ SOLN
INTRAMUSCULAR | Status: AC
  Filled 2022-06-01: qty 6

## 2022-06-01 MED ORDER — DEXTROSE 50 % IV SOLN
INTRAVENOUS | Status: AC
  Filled 2022-06-01: qty 50

## 2022-06-01 MED ORDER — IPRATROPIUM-ALBUTEROL 0.5-2.5 (3) MG/3ML IN SOLN
3.0000 mL | Freq: Four times a day (QID) | RESPIRATORY_TRACT | Status: DC
  Administered 2022-06-01 (×3): 3 mL via RESPIRATORY_TRACT
  Filled 2022-06-01 (×3): qty 3
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
## 2022-06-01 NOTE — Progress Notes (Signed)
Pt cont's to be lethargic, opens eyes to name, attempts to mumble, VSS remains on 5L [place].

## 2022-06-01 NOTE — Progress Notes (Signed)
Pt arrived from ED ~2300. Pt lethargic not following commands, VSS, on 5L [place]. All wounds pictured/measured/documented, second witness Veterinary surgeon.

## 2022-06-02 ENCOUNTER — Inpatient Hospital Stay (HOSPITAL_COMMUNITY): Payer: Medicaid Other

## 2022-06-02 DIAGNOSIS — F319 Bipolar disorder, unspecified: Secondary | ICD-10-CM

## 2022-06-02 DIAGNOSIS — B377 Candidal sepsis: Secondary | ICD-10-CM | POA: Diagnosis not present

## 2022-06-02 DIAGNOSIS — G9340 Encephalopathy, unspecified: Secondary | ICD-10-CM | POA: Diagnosis not present

## 2022-06-02 DIAGNOSIS — J449 Chronic obstructive pulmonary disease, unspecified: Secondary | ICD-10-CM | POA: Diagnosis not present

## 2022-06-02 LAB — PHOSPHORUS: Phosphorus: 1.5 mg/dL — ABNORMAL LOW (ref 2.5–4.6)

## 2022-06-02 LAB — BLOOD CULTURE ID PANEL (REFLEXED) - BCID2

## 2022-06-02 LAB — BASIC METABOLIC PANEL
Anion gap: 5 (ref 5–15)
BUN: 14 mg/dL (ref 6–20)
CO2: 35 mmol/L — ABNORMAL HIGH (ref 22–32)
Calcium: 8.1 mg/dL — ABNORMAL LOW (ref 8.9–10.3)
Chloride: 103 mmol/L (ref 98–111)
Creatinine, Ser: 0.65 mg/dL (ref 0.61–1.24)
GFR, Estimated: >60 mL/min (ref >60)
Glucose, Bld: 145 mg/dL — ABNORMAL HIGH (ref 70–99)
Potassium: 3.5 mmol/L (ref 3.5–5.1)
Sodium: 143 mmol/L (ref 135–145)

## 2022-06-02 LAB — CBC
HCT: 33.7 % — ABNORMAL LOW (ref 39.0–52.0)
Hemoglobin: 10 g/dL — ABNORMAL LOW (ref 13.0–17.0)
MCH: 30.9 pg (ref 26.0–34.0)
MCHC: 29.7 g/dL — ABNORMAL LOW (ref 30.0–36.0)
MCV: 104 fL — ABNORMAL HIGH (ref 80.0–100.0)
Platelets: 183 10*3/uL (ref 150–400)
RBC: 3.24 MIL/uL — ABNORMAL LOW (ref 4.22–5.81)
RDW: 16.5 % — ABNORMAL HIGH (ref 11.5–15.5)
WBC: 12 10*3/uL — ABNORMAL HIGH (ref 4.0–10.5)
nRBC: 0.2 % (ref 0.0–0.2)

## 2022-06-02 LAB — BLOOD GAS, ARTERIAL
Acid-Base Excess: 18.1 mmol/L — ABNORMAL HIGH (ref 0.0–2.0)
Bicarbonate: 44.5 mmol/L — ABNORMAL HIGH (ref 20.0–28.0)
Drawn by: 22179
FIO2: 44 %
O2 Saturation: 99.6 %
Patient temperature: 37.8
pCO2 arterial: 59 mmHg — ABNORMAL HIGH (ref 32–48)
pH, Arterial: 7.49 — ABNORMAL HIGH (ref 7.35–7.45)
pO2, Arterial: 80 mmHg — ABNORMAL LOW (ref 83–108)

## 2022-06-02 LAB — GLUCOSE, CAPILLARY
Glucose-Capillary: 130 mg/dL — ABNORMAL HIGH (ref 70–99)
Glucose-Capillary: 136 mg/dL — ABNORMAL HIGH (ref 70–99)
Glucose-Capillary: 140 mg/dL — ABNORMAL HIGH (ref 70–99)
Glucose-Capillary: 146 mg/dL — ABNORMAL HIGH (ref 70–99)
Glucose-Capillary: 182 mg/dL — ABNORMAL HIGH (ref 70–99)
Glucose-Capillary: 95 mg/dL (ref 70–99)
Glucose-Capillary: 98 mg/dL (ref 70–99)

## 2022-06-02 LAB — URINE CULTURE: Culture: NO GROWTH

## 2022-06-02 LAB — BRAIN NATRIURETIC PEPTIDE: B Natriuretic Peptide: 151 pg/mL — ABNORMAL HIGH (ref 0.0–100.0)

## 2022-06-02 LAB — AMMONIA: Ammonia: 24 umol/L (ref 9–35)

## 2022-06-02 LAB — MAGNESIUM: Magnesium: 1.8 mg/dL (ref 1.7–2.4)

## 2022-06-02 MED ORDER — MUPIROCIN 2 % EX OINT
1.0000 | TOPICAL_OINTMENT | Freq: Every day | CUTANEOUS | Status: DC
Start: 2022-06-02 — End: 2022-06-12
  Administered 2022-06-02 – 2022-06-12 (×9): 1 via TOPICAL
  Filled 2022-06-02 (×4): qty 22

## 2022-06-02 MED ORDER — ALBUTEROL SULFATE (2.5 MG/3ML) 0.083% IN NEBU
2.5000 mg | INHALATION_SOLUTION | RESPIRATORY_TRACT | Status: DC | PRN
  Administered 2022-06-07 – 2022-06-08 (×3): 2.5 mg via RESPIRATORY_TRACT
  Filled 2022-06-02 (×4): qty 3

## 2022-06-02 MED ORDER — FUROSEMIDE 10 MG/ML IJ SOLN
20.0000 mg | Freq: Once | INTRAMUSCULAR | Status: AC
Start: 2022-06-02 — End: 2022-06-02
  Administered 2022-06-02: 20 mg via INTRAVENOUS
  Filled 2022-06-02: qty 2

## 2022-06-02 MED ORDER — SODIUM CHLORIDE 0.9 % IV SOLN
100.0000 mg | INTRAVENOUS | Status: DC
  Administered 2022-06-02 – 2022-06-04 (×2): 100 mg via INTRAVENOUS
  Filled 2022-06-02 (×4): qty 5

## 2022-06-02 MED ORDER — POTASSIUM PHOSPHATES 15 MMOLE/5ML IV SOLN
45.0000 mmol | Freq: Once | INTRAVENOUS | Status: AC
  Administered 2022-06-02: 45 mmol via INTRAVENOUS
  Filled 2022-06-02: qty 15

## 2022-06-02 MED ORDER — MORPHINE SULFATE (PF) 2 MG/ML IV SOLN
1.0000 mg | INTRAVENOUS | Status: AC
  Administered 2022-06-02: 1 mg via INTRAVENOUS
  Filled 2022-06-02: qty 1

## 2022-06-02 MED ORDER — ARFORMOTEROL TARTRATE 15 MCG/2ML IN NEBU
15.0000 ug | INHALATION_SOLUTION | Freq: Two times a day (BID) | RESPIRATORY_TRACT | Status: DC
  Administered 2022-06-02 – 2022-06-07 (×10): 15 ug via RESPIRATORY_TRACT
  Filled 2022-06-02 (×10): qty 2

## 2022-06-02 MED ORDER — COLLAGENASE 250 UNIT/GM EX OINT
TOPICAL_OINTMENT | Freq: Every day | CUTANEOUS | Status: DC
Start: 2022-06-02 — End: 2022-06-12
  Filled 2022-06-02 (×2): qty 30

## 2022-06-02 MED ORDER — SILVER SULFADIAZINE 1 % EX CREA
TOPICAL_CREAM | Freq: Every day | CUTANEOUS | Status: DC
  Filled 2022-06-02: qty 50

## 2022-06-02 MED ORDER — METHYLPREDNISOLONE SODIUM SUCC 40 MG IJ SOLR
40.0000 mg | Freq: Two times a day (BID) | INTRAMUSCULAR | Status: DC
  Administered 2022-06-02 – 2022-06-04 (×5): 40 mg via INTRAVENOUS
  Filled 2022-06-02 (×5): qty 1

## 2022-06-02 MED ORDER — MORPHINE SULFATE (PF) 2 MG/ML IV SOLN
2.0000 mg | Freq: Once | INTRAVENOUS | Status: AC
  Administered 2022-06-02: 2 mg via INTRAVENOUS
  Filled 2022-06-02: qty 1

## 2022-06-02 MED ORDER — IPRATROPIUM-ALBUTEROL 0.5-2.5 (3) MG/3ML IN SOLN
3.0000 mL | Freq: Four times a day (QID) | RESPIRATORY_TRACT | Status: DC
Start: 2022-06-02 — End: 2022-06-06
  Administered 2022-06-02 – 2022-06-05 (×12): 3 mL via RESPIRATORY_TRACT
  Filled 2022-06-02 (×14): qty 3

## 2022-06-02 MED ORDER — BUDESONIDE 0.25 MG/2ML IN SUSP
0.2500 mg | Freq: Two times a day (BID) | RESPIRATORY_TRACT | Status: DC
  Administered 2022-06-02 – 2022-06-07 (×10): 0.25 mg via RESPIRATORY_TRACT
  Filled 2022-06-02 (×10): qty 2

## 2022-06-02 NOTE — Progress Notes (Signed)
Checked on Patient , Patient resting without problems on nasal cannula. BiPAP on standby  in hall.

## 2022-06-02 NOTE — Consult Note (Signed)
[place] Nurse Consult Note: Reason for Consult:Consulted requested for multiple wounds. Patient known to our department from two recent admissions on  7/10 and 7/26.  Seen during the last admission for assessment of back wound by Surgery (Dr. Arnoldo Morale), on 05/22/22. No surgical intervention was indicated at that time and recommendation was to continue with Santyl (collagenase). Review of medical record today, including photographs taken yesterday at admission prior to providing guidance for care. As previously noted, there are multiple systemic factors present that impact tissue repair and regeneration. Wound type: Thermal, pressure Pressure Injury POA: Yes Measurement:See Nursing Flow Sheet for documentation of wound measurements by Bedside RN Otho Ket. Wound bed:See photodocumentation under Media Tab of EHR. Dressing procedure/placement/frequency: Topical treatment orders provided for Bedside nurses to perform as follows: 1. Silver sulfadiazine cream applied in a 1/8 inch layer to bilat foot and leg wounds after cleansing with NS, and gently patting dry to remove previous cream. The Silvadene cream is to be topped with saline dampened gauze and covered with an ABD pad for comfort. This is to be secured with Kerlix roll gauze and paper tape.  2. Apply Santyl to areas of nonviable tissue on back wound (several areas) and coccyx wound daily, then cover with moist gauze and foam dressing.  (Change foam dressing every 3 days and PRN soiling) 3. Wound care to LLE laceration approximated loosely by EDP with 7 staples on 05/31/22. Measurement by Provider is 10cm in length and 3cm depth. Continue with antibiotic ointment and dry dressing. Staples to remain in place for 14 days per EDP noted in Dr. Ronnie Derby entry on 8/5, or until 06/14/22. Daily care will be with mupirocin cream once daily.   Other PI orders provided for Nursing are for turning and repositioning per house protocol to minimize time in the supine position  and for the floatation of heels.  If desired, or if wound healing is not consistent with patient's overall status, consider reconsulting Surgery for input to [place].  [place] nursing team will not follow, but will remain available to this patient, the nursing and medical teams.  Please re-consult if needed.  Thank you for inviting [place] to participate in this patient's Plan of Care.  Maudie Flakes, MSN, RN, CNS, [place], Serita Grammes, [employer], [employer] phone:  [phone]

## 2022-06-02 NOTE — Progress Notes (Signed)
Patient moved from ICU to floor . Mdi's lost as of note. RT called by nurse as patient who had been resting now is upset and restless stating to turn up oxygen he cannot breath. Oxygen turned up and nebulizer given. Breath sounds decreased.Marland Kitchen Respirations in 20's. Patient drifting of to sleep. Will Titrate oxygen down.

## 2022-06-02 NOTE — Progress Notes (Signed)
Once again , Patient Tony Sullivan is refusing nebulizer treatments. Brovona and Pulmicort  had been pulled and placed in Nebs. BiPAP is outside of door. This is his standard practice. Patient has done this on the past 11 visits. So this is nothing new. Meds are available with compliance.

## 2022-06-02 NOTE — Consult Note (Signed)
[facility]  Total days of antibiotics 3               Reason for Consult: candidemia   Referring Physician: memon  Principal Problem:   Encephalopathy Active Problems:   COPD (chronic obstructive pulmonary disease) (HCC)   Tobacco abuse   Essential hypertension   GERD (gastroesophageal reflux disease)   ETOH abuse   Bipolar disorder (HCC)   H/o Pulmonary embolism (HCC)   Non-small cell lung cancer ([place])   Decubitus ulcer of back, stage 2 ([place])   Hypothermia   Acute encephalopathy   Substance abuse ([place])   Leg wound, left    HPI: Tony Sullivan is a 61 y.o. male with hxCOPD, hx of  NSLCa dx in may 2023, bipolar disease, hx of PE in mid may on apixaban, admitted on  8/4 for AMS but was discharged roughly 3-4 days prior. On admit, he was hypothermic, unresponsive. Labs significant for leukocytosis. Unclear source of infection ua clear, cxr showing emphysema and LUL lesions c/w malignancy. His UDS + to possibly account for AMS. He also has numerous skin lesions, (see pictures under media) left leg lesions for which concern for infection. He was started on vanco/cefepime. Blood cx this morning + candidemia. He was started on micafungin. Past Medical History:  Diagnosis Date   Anxiety    Arthritis    COPD (chronic obstructive pulmonary disease) ([place]) 2004   diagnosed in 2004, no PFT's to date.  Started on home O2 12/2013, after found to be desatting at PCP's office, and referred to pulmonology.   Depression    GERD (gastroesophageal reflux disease)    High blood pressure    Prediabetes    Seasonal allergies    Substance abuse (HCC)    Tobacco dependence     Allergies:  Allergies  Allergen Reactions   Penicillins Anaphylaxis    Has patient had a PCN reaction causing immediate rash, facial/tongue/throat swelling, SOB or lightheadedness with hypotension: Yes Has patient had a PCN reaction causing severe rash involving mucus membranes or skin  necrosis: No Has patient had a PCN reaction that required hospitalization No Has patient had a PCN reaction occurring within the last 10 years: No If all of the above answers are "NO", then may proceed with Cephalosporin use. Cephalosporins ok   Levocetirizine Other (See Comments)    Muscle cramps     MEDICATIONS:  Chlorhexidine Gluconate Cloth  6 each Topical Daily   collagenase   Topical Daily   dorzolamide  1 drop Left Eye BID   enoxaparin (LOVENOX) injection  60 mg Subcutaneous BID   fluticasone furoate-vilanterol  1 puff Inhalation Daily   And   umeclidinium bromide  1 puff Inhalation Daily   ipratropium-albuterol  3 mL Nebulization Q6H WA   iron polysaccharides  150 mg Oral Daily   latanoprost  1 drop Both Eyes QHS    morphine injection  1 mg Intravenous NOW   mupirocin ointment  1 Application Topical Daily   silver sulfADIAZINE   Topical Daily   Vitamin D (Ergocalciferol)  50,000 Units Oral Q7 days    Social History   Tobacco Use   Smoking status: Some Days    Packs/day: 0.25    Years: 36.00    Total pack years: 9.00    Types: Cigarettes    Last attempt to quit: 10/2021    Years since quitting: 0.5   Smokeless tobacco: Never  Vaping Use  Vaping Use: Never used  Substance Use Topics   Alcohol use: Not Currently    Alcohol/week: 14.0 - 21.0 standard drinks of alcohol    Types: 14 - 21 Standard drinks or equivalent per week    Comment: 2-3 beers/day, with occasional "binges".  No history of withdrawal   Drug use: Yes    Types: Marijuana, Cocaine    Comment: last cocaine February 2023    Family History  Problem Relation Age of Onset   Emphysema Mother    Allergies Mother        "everyone in family"   Asthma Mother        "everyone in family"   Heart disease Mother    Clotting disorder Mother    Cancer Mother    Emphysema Father    Allergies Father    Allergies Son    Seizures Brother    Diabetes Brother     Review of Systems - Unable to obtain  since obtunded  OBJECTIVE: Temp:  [97.4 F (36.3 C)-99 F (37.2 C)] 99 F (37.2 C) (08/06 0850) Pulse Rate:  [79-111] 111 (08/06 1500) Resp:  [17-28] 28 (08/06 1500) BP: (117-165)/(68-98) 117/82 (08/06 1500) SpO2:  [92 %-100 %] 94 % (08/06 1500) Did not examine  LABS: Results for orders placed or performed during the hospital encounter of 05/31/22 (from the past 48 hour(s))  Resp Panel by RT-PCR (Flu A&B, Covid) Urine, Catheterized     Status: None   Collection Time: 05/31/22  5:31 PM   Specimen: Urine, Catheterized; Nasal Swab  Result Value Ref Range   SARS Coronavirus 2 by RT PCR NEGATIVE NEGATIVE    Comment: (NOTE) SARS-CoV-2 target nucleic acids are NOT DETECTED.  The SARS-CoV-2 RNA is generally detectable in upper respiratory specimens during the acute phase of infection. The lowest concentration of SARS-CoV-2 viral copies this assay can detect is 138 copies/mL. A negative result does not preclude SARS-Cov-2 infection and should not be used as the sole basis for treatment or other patient management decisions. A negative result may occur with  improper specimen collection/handling, submission of specimen other than nasopharyngeal swab, presence of viral mutation(s) within the areas targeted by this assay, and inadequate number of viral copies(<138 copies/mL). A negative result must be combined with clinical observations, patient history, and epidemiological information. The expected result is Negative.  Fact Sheet for Patients:  EntrepreneurPulse.com.au  Fact Sheet for Healthcare Providers:  IncredibleEmployment.be  This test is no t yet approved or cleared by the [place] FDA and  has been authorized for detection and/or diagnosis of SARS-CoV-2 by FDA under an Emergency Use Authorization (EUA). This EUA will remain  in effect (meaning this test can be used) for the duration of the COVID-19 declaration under Section 564(b)(1)  of the Act, 21 U.S.C.section 360bbb-3(b)(1), unless the authorization is terminated  or revoked sooner.       Influenza A by PCR NEGATIVE NEGATIVE   Influenza B by PCR NEGATIVE NEGATIVE    Comment: (NOTE) The Xpert Xpress SARS-CoV-2/FLU/RSV plus assay is intended as an aid in the diagnosis of influenza from Nasopharyngeal swab specimens and should not be used as a sole basis for treatment. Nasal washings and aspirates are unacceptable for Xpert Xpress SARS-CoV-2/FLU/RSV testing.  Fact Sheet for Patients: EntrepreneurPulse.com.au  Fact Sheet for Healthcare Providers: IncredibleEmployment.be  This test is not yet approved or cleared by the [place] FDA and has been authorized for detection and/or diagnosis of SARS-CoV-2 by FDA under an Emergency  Use Authorization (EUA). This EUA will remain in effect (meaning this test can be used) for the duration of the COVID-19 declaration under Section 564(b)(1) of the Act, 21 U.S.C. section 360bbb-3(b)(1), unless the authorization is terminated or revoked.  Performed at [facility], [address] [place], [place] [address]   Urinalysis, Routine w reflex microscopic Urine, Catheterized     Status: Abnormal   Collection Time: 05/31/22  5:31 PM  Result Value Ref Range   Color, Urine YELLOW YELLOW   APPearance HAZY (A) CLEAR   Specific Gravity, Urine 1.017 1.005 - 1.030   pH 5.0 5.0 - 8.0   Glucose, UA NEGATIVE NEGATIVE mg/dL   Hgb urine dipstick NEGATIVE NEGATIVE   Bilirubin Urine NEGATIVE NEGATIVE   Ketones, ur 5 (A) NEGATIVE mg/dL   Protein, ur 30 (A) NEGATIVE mg/dL   Nitrite NEGATIVE NEGATIVE   Leukocytes,Ua NEGATIVE NEGATIVE   RBC / HPF 0-5 0 - 5 RBC/hpf   WBC, UA 0-5 0 - 5 WBC/hpf   Bacteria, UA RARE (A) NONE SEEN   Squamous Epithelial / LPF 0-5 0 - 5   Mucus PRESENT    Hyaline Casts, UA PRESENT     Comment: Performed at [facility], [address], [place], [place] [address]  Urine  Culture     Status: None   Collection Time: 05/31/22  5:31 PM   Specimen: In/Out Cath Urine  Result Value Ref Range   Specimen Description      IN/OUT CATH URINE Performed at [facility], [address] [place], [place] [address]    Special Requests      NONE Performed at [facility], [address], [place], [place] [address]    Culture      NO GROWTH Performed at [facility], [place] [address], [place], [place] [address]    Report Status 06/02/2022 FINAL   Rapid urine drug screen (hospital performed)     Status: Abnormal   Collection Time: 05/31/22  5:31 PM  Result Value Ref Range   Opiates POSITIVE (A) NONE DETECTED   Cocaine POSITIVE (A) NONE DETECTED   Benzodiazepines NONE DETECTED NONE DETECTED   Amphetamines NONE DETECTED NONE DETECTED   Tetrahydrocannabinol NONE DETECTED NONE DETECTED   Barbiturates NONE DETECTED NONE DETECTED    Comment: (NOTE) DRUG SCREEN FOR MEDICAL PURPOSES ONLY.  IF CONFIRMATION IS NEEDED FOR ANY PURPOSE, NOTIFY LAB WITHIN 5 DAYS.  LOWEST DETECTABLE LIMITS FOR URINE DRUG SCREEN Drug Class                     Cutoff (ng/mL) Amphetamine and metabolites    1000 Barbiturate and metabolites    200 Benzodiazepine                 542 Tricyclics and metabolites     300 Opiates and metabolites        300 Cocaine and metabolites        300 THC                            50 Performed at [facility], [address], [place], [place] [address]   CBG monitoring, ED     Status: None   Collection Time: 05/31/22  5:34 PM  Result Value Ref Range   Glucose-Capillary 71 70 - 99 mg/dL    Comment: Glucose reference range applies only to samples taken after fasting for at least 8 hours.  Blood Culture (routine x 2)     Status: None (Preliminary result)   Collection Time: 05/31/22  6:10 PM   Specimen: Left Antecubital; Blood  Result Value Ref Range   Specimen Description      LEFT ANTECUBITAL Performed at [facility], [address] [place],  [place] [address]    Special Requests      BOTTLES DRAWN AEROBIC AND ANAEROBIC Blood Culture adequate volume Performed at [facility], [address], [place], [place] [address]    Culture  Setup Time      YEAST WITH PSEUDOHYPHAE Gram Stain Report Called to,Read Back By and Verified With:  K. WATSON @ 0814 BY STEPHTR 06/02/2022 Organism ID to follow Performed at [facility], [place] [address], [place], [place] [address]    Culture YEAST    Report Status PENDING   Blood Culture ID Panel (Reflexed)     Status: Abnormal   Collection Time: 05/31/22  6:10 PM  Result Value Ref Range   Enterococcus faecalis NOT DETECTED NOT DETECTED   Enterococcus Faecium NOT DETECTED NOT DETECTED   Listeria monocytogenes NOT DETECTED NOT DETECTED   Staphylococcus species NOT DETECTED NOT DETECTED   Staphylococcus aureus (BCID) NOT DETECTED NOT DETECTED   Staphylococcus epidermidis NOT DETECTED NOT DETECTED   Staphylococcus lugdunensis NOT DETECTED NOT DETECTED   Streptococcus species NOT DETECTED NOT DETECTED   Streptococcus agalactiae NOT DETECTED NOT DETECTED   Streptococcus pneumoniae NOT DETECTED NOT DETECTED   Streptococcus pyogenes NOT DETECTED NOT DETECTED   A.calcoaceticus-baumannii NOT DETECTED NOT DETECTED   Bacteroides fragilis NOT DETECTED NOT DETECTED   Enterobacterales NOT DETECTED NOT DETECTED   Enterobacter cloacae complex NOT DETECTED NOT DETECTED   Escherichia coli NOT DETECTED NOT DETECTED   Klebsiella aerogenes NOT DETECTED NOT DETECTED   Klebsiella oxytoca NOT DETECTED NOT DETECTED   Klebsiella pneumoniae NOT DETECTED NOT DETECTED   Proteus species NOT DETECTED NOT DETECTED   Salmonella species NOT DETECTED NOT DETECTED   Serratia marcescens NOT DETECTED NOT DETECTED   Haemophilus influenzae NOT DETECTED NOT DETECTED   Neisseria meningitidis NOT DETECTED NOT DETECTED   Pseudomonas aeruginosa NOT DETECTED NOT DETECTED   Stenotrophomonas maltophilia NOT DETECTED NOT DETECTED    Candida albicans DETECTED (A) NOT DETECTED    Comment: CRITICAL RESULT CALLED TO, READ BACK BY AND VERIFIED WITH: K. WATSON RN, AT 1552 06/02/22 BY D. VANHOOK    Candida auris NOT DETECTED NOT DETECTED   Candida glabrata NOT DETECTED NOT DETECTED   Candida krusei NOT DETECTED NOT DETECTED   Candida parapsilosis NOT DETECTED NOT DETECTED   Candida tropicalis NOT DETECTED NOT DETECTED   Cryptococcus neoformans/gattii NOT DETECTED NOT DETECTED    Comment: Performed at [facility], [address] [place], [place] [address]  Lactic acid, plasma     Status: Abnormal   Collection Time: 05/31/22  6:15 PM  Result Value Ref Range   Lactic Acid, Venous 3.7 (HH) 0.5 - 1.9 mmol/L    Comment: CRITICAL RESULT CALLED TO, READ BACK BY AND VERIFIED WITH: TURNER,C ON 05/31/22 AT 1950 BY LOY,C Performed at [facility], [address] [place], [place] [address]   Comprehensive metabolic panel     Status: Abnormal   Collection Time: 05/31/22  6:15 PM  Result Value Ref Range   Sodium 143 135 - 145 mmol/L   Potassium 4.3 3.5 - 5.1 mmol/L   Chloride 93 (L) 98 - 111 mmol/L   CO2 38 (H) 22 -  32 mmol/L   Glucose, Bld 83 70 - 99 mg/dL    Comment: Glucose reference range applies only to samples taken after fasting for at least 8 hours.   BUN 40 (H) 6 - 20 mg/dL   Creatinine, Ser 0.90 0.61 - 1.24 mg/dL   Calcium 9.2 8.9 - 10.3 mg/dL   Total Protein 5.9 (L) 6.5 - 8.1 g/dL   Albumin 2.7 (L) 3.5 - 5.0 g/dL   AST 33 15 - 41 U/L   ALT 31 0 - 44 U/L   Alkaline Phosphatase 129 (H) 38 - 126 U/L   Total Bilirubin 1.2 0.3 - 1.2 mg/dL   GFR, Estimated >60 >60 mL/min    Comment: (NOTE) Calculated using the CKD-EPI Creatinine Equation (2021)    Anion gap 12 5 - 15    Comment: Performed at [facility], [address], [place], [place] [address]  CBC with Differential     Status: Abnormal   Collection Time: 05/31/22  6:15 PM  Result Value Ref Range   WBC 15.6 (H) 4.0 - 10.5 K/uL   RBC 3.53 (L) 4.22 - 5.81  MIL/uL   Hemoglobin 10.7 (L) 13.0 - 17.0 g/dL   HCT 37.4 (L) 39.0 - 52.0 %   MCV 105.9 (H) 80.0 - 100.0 fL   MCH 30.3 26.0 - 34.0 pg   MCHC 28.6 (L) 30.0 - 36.0 g/dL   RDW 16.1 (H) 11.5 - 15.5 %   Platelets 237 150 - 400 K/uL   nRBC 0.0 0.0 - 0.2 %   Neutrophils Relative % 90 %   Neutro Abs 14.1 (H) 1.7 - 7.7 K/uL   Lymphocytes Relative 2 %   Lymphs Abs 0.3 (L) 0.7 - 4.0 K/uL   Monocytes Relative 6 %   Monocytes Absolute 1.0 0.1 - 1.0 K/uL   Eosinophils Relative 0 %   Eosinophils Absolute 0.0 0.0 - 0.5 K/uL   Basophils Relative 0 %   Basophils Absolute 0.0 0.0 - 0.1 K/uL   Immature Granulocytes 2 %   Abs Immature Granulocytes 0.24 (H) 0.00 - 0.07 K/uL    Comment: Performed at [facility], [address], [place], [place] [address]  Protime-INR     Status: None   Collection Time: 05/31/22  6:15 PM  Result Value Ref Range   Prothrombin Time 14.7 11.4 - 15.2 seconds   INR 1.2 0.8 - 1.2    Comment: (NOTE) INR goal varies based on device and disease states. Performed at [facility], [address], [place], [place] [address]   APTT     Status: None   Collection Time: 05/31/22  6:15 PM  Result Value Ref Range   aPTT 27 24 - 36 seconds    Comment: Performed at [facility], [address] [place], [place] [address]  Blood Culture (routine x 2)     Status: None (Preliminary result)   Collection Time: 05/31/22  6:15 PM   Specimen: BLOOD RIGHT ARM  Result Value Ref Range   Specimen Description BLOOD RIGHT ARM    Special Requests BACTERIAL CASTS Blood Culture adequate volume    Culture      NO GROWTH 2 DAYS Performed at [facility], [address], [place], [place] [address]    Report Status PENDING   CK     Status: None   Collection Time: 05/31/22  6:15 PM  Result Value Ref Range   Total CK 91 49 - 397 U/L    Comment: Performed at [facility], [address] [place], [place] [address]  Ethanol     Status: None   Collection Time: 05/31/22  6:18 PM  Result Value Ref Range    Alcohol, Ethyl (B) <10 <10 mg/dL    Comment: (NOTE) Lowest detectable limit for serum alcohol is 10 mg/dL.  For medical purposes only. Performed at [facility], [address] [place], [place] [address]   MRSA Next Gen by PCR, Nasal     Status: None   Collection Time: 05/31/22  7:00 PM   Specimen: Nasal Mucosa; Nasal Swab  Result Value Ref Range   MRSA by PCR Next Gen NOT DETECTED NOT DETECTED    Comment: (NOTE) The GeneXpert MRSA Assay (FDA approved for NASAL specimens only), is one component of a comprehensive MRSA colonization surveillance program. It is not intended to diagnose MRSA infection nor to guide or monitor treatment for MRSA infections. Test performance is not FDA approved in patients less than 73 years old. Performed at [facility], [address] [place], [place] [address]   Lactic acid, plasma     Status: None   Collection Time: 05/31/22  8:30 PM  Result Value Ref Range   Lactic Acid, Venous 1.3 0.5 - 1.9 mmol/L    Comment: Performed at [facility], [address] [place], [place] [address]  Glucose, capillary     Status: None   Collection Time: 05/31/22 11:02 PM  Result Value Ref Range   Glucose-Capillary 72 70 - 99 mg/dL    Comment: Glucose reference range applies only to samples taken after fasting for at least 8 hours.  Phosphorus     Status: None   Collection Time: 06/01/22  5:47 AM  Result Value Ref Range   Phosphorus 2.9 2.5 - 4.6 mg/dL    Comment: Performed at [facility], [address], [place], [place] [address]  Basic metabolic panel     Status: Abnormal   Collection Time: 06/01/22  5:47 AM  Result Value Ref Range   Sodium 146 (H) 135 - 145 mmol/L   Potassium 3.9 3.5 - 5.1 mmol/L   Chloride 102 98 - 111 mmol/L   CO2 34 (H) 22 - 32 mmol/L   Glucose, Bld 54 (L) 70 - 99 mg/dL    Comment: Glucose reference range applies only to samples taken after fasting for at least 8 hours.   BUN 30 (H) 6 - 20 mg/dL   Creatinine, Ser 0.67 0.61 - 1.24  mg/dL   Calcium 8.5 (L) 8.9 - 10.3 mg/dL   GFR, Estimated >60 >60 mL/min    Comment: (NOTE) Calculated using the CKD-EPI Creatinine Equation (2021)    Anion gap 10 5 - 15    Comment: Performed at [facility], [address], [place], [place] [address]  CBC     Status: Abnormal   Collection Time: 06/01/22  5:47 AM  Result Value Ref Range   WBC 11.5 (H) 4.0 - 10.5 K/uL   RBC 3.02 (L) 4.22 - 5.81 MIL/uL   Hemoglobin 9.4 (L) 13.0 - 17.0 g/dL   HCT 31.9 (L) 39.0 - 52.0 %   MCV 105.6 (H) 80.0 - 100.0 fL   MCH 31.1 26.0 - 34.0 pg   MCHC 29.5 (L) 30.0 - 36.0 g/dL   RDW 16.3 (H) 11.5 - 15.5 %   Platelets 203 150 - 400 K/uL   nRBC 0.0 0.0 - 0.2 %    Comment: Performed at [facility], [address] [place], [place] [address]  Magnesium     Status: None   Collection Time: 06/01/22  6:43 AM  Result Value Ref Range   Magnesium 2.1 1.7 - 2.4 mg/dL    Comment: Performed at [facility], [address], [place], [place] [address]  Folate     Status: None   Collection Time: 06/01/22  6:43 AM  Result Value Ref Range   Folate 8.9 >5.9 ng/mL    Comment: Performed at [facility], [address] [place], [place] [address]  Iron and TIBC     Status: Abnormal   Collection Time: 06/01/22  6:43 AM  Result Value Ref Range   Iron 26 (L) 45 - 182 ug/dL   TIBC 129 (L) 250 - 450 ug/dL   Saturation Ratios 20 17.9 - 39.5 %   UIBC 103 ug/dL    Comment: Performed at [facility], [address], [place], [place] [address]  VITAMIN D 25 Hydroxy (Vit-D Deficiency, Fractures)     Status: Abnormal   Collection Time: 06/01/22  6:43 AM  Result Value Ref Range   Vit D, 25-Hydroxy 19.35 (L) 30 - 100 ng/mL    Comment: (NOTE) Vitamin D deficiency has been defined by the [place] practice guideline as a level of serum 25-OH  vitamin D less than 20 ng/mL (1,2). The Endocrine Society went on to  further define vitamin D insufficiency as a level between 21 and 29  ng/mL  (2).  1. IOM (Institute of Medicine). 2010. Dietary reference intakes for  calcium and D. [place]: The [employer]. 2. Holick MF, Binkley [place], Bischoff-Ferrari HA, et al. Evaluation,  treatment, and prevention of vitamin D deficiency: an Endocrine  Society clinical practice guideline, JCEM. 2011 Jul; 96(7): 1911-30.  Performed at [facility], [place] [address] [place], [place] [address]   Glucose, capillary     Status: Abnormal   Collection Time: 06/01/22  7:41 AM  Result Value Ref Range   Glucose-Capillary 55 (L) 70 - 99 mg/dL    Comment: Glucose reference range applies only to samples taken after fasting for at least 8 hours.  Glucose, capillary     Status: Abnormal   Collection Time: 06/01/22  8:08 AM  Result Value Ref Range   Glucose-Capillary 100 (H) 70 - 99 mg/dL    Comment: Glucose reference range applies only to samples taken after fasting for at least 8 hours.  Blood gas, arterial     Status: Abnormal   Collection Time: 06/01/22 10:56 AM  Result Value Ref Range   FIO2 40 %   pH, Arterial 7.46 (H) 7.35 - 7.45   pCO2 arterial 57 (H) 32 - 48 mmHg   pO2, Arterial 60 (L) 83 - 108 mmHg   Bicarbonate 40.3 (H) 20.0 - 28.0 mmol/L   Acid-Base Excess 13.6 (H) 0.0 - 2.0 mmol/L   O2 Saturation 94.3 %   Patient temperature 36.5    Collection site RIGHT RADIAL    Drawn by [id number]    Allens test (pass/fail) PASS PASS    Comment: Performed at [facility], [address] [place], [place] [address]  Glucose, capillary     Status: Abnormal   Collection Time: 06/01/22  3:50 PM  Result Value Ref Range   Glucose-Capillary 126 (H) 70 - 99 mg/dL    Comment: Glucose reference range applies only to samples taken after fasting for at least 8 hours.  Glucose, capillary     Status: Abnormal  Collection Time: 06/01/22  8:40 PM  Result Value Ref Range   Glucose-Capillary 129 (H) 70 - 99 mg/dL    Comment: Glucose reference range applies only to samples taken after fasting  for at least 8 hours.  Glucose, capillary     Status: Abnormal   Collection Time: 06/02/22 12:52 AM  Result Value Ref Range   Glucose-Capillary 130 (H) 70 - 99 mg/dL    Comment: Glucose reference range applies only to samples taken after fasting for at least 8 hours.  Glucose, capillary     Status: Abnormal   Collection Time: 06/02/22  4:19 AM  Result Value Ref Range   Glucose-Capillary 146 (H) 70 - 99 mg/dL    Comment: Glucose reference range applies only to samples taken after fasting for at least 8 hours.  Basic metabolic panel     Status: Abnormal   Collection Time: 06/02/22  4:28 AM  Result Value Ref Range   Sodium 143 135 - 145 mmol/L   Potassium 3.5 3.5 - 5.1 mmol/L   Chloride 103 98 - 111 mmol/L   CO2 35 (H) 22 - 32 mmol/L   Glucose, Bld 145 (H) 70 - 99 mg/dL    Comment: Glucose reference range applies only to samples taken after fasting for at least 8 hours.   BUN 14 6 - 20 mg/dL   Creatinine, Ser 0.65 0.61 - 1.24 mg/dL   Calcium 8.1 (L) 8.9 - 10.3 mg/dL   GFR, Estimated >60 >60 mL/min    Comment: (NOTE) Calculated using the CKD-EPI Creatinine Equation (2021)    Anion gap 5 5 - 15    Comment: Performed at [facility], [address], [place], [place] [address]  Magnesium     Status: None   Collection Time: 06/02/22  4:28 AM  Result Value Ref Range   Magnesium 1.8 1.7 - 2.4 mg/dL    Comment: Performed at [facility], [address] [place] [address], [place], [place] [address]  Phosphorus     Status: Abnormal   Collection Time: 06/02/22  4:28 AM  Result Value Ref Range   Phosphorus 1.5 (L) 2.5 - 4.6 mg/dL    Comment: Performed at [facility], [address], [place], [place] [address]  CBC     Status: Abnormal   Collection Time: 06/02/22  5:37 AM  Result Value Ref Range   WBC 12.0 (H) 4.0 - 10.5 K/uL   RBC 3.24 (L) 4.22 - 5.81 MIL/uL   Hemoglobin 10.0 (L) 13.0 - 17.0 g/dL   HCT 33.7 (L) 39.0 - 52.0 %   MCV 104.0 (H) 80.0 - 100.0 fL   MCH 30.9 26.0 - 34.0 pg   MCHC 29.7 (L) 30.0  - 36.0 g/dL   RDW 16.5 (H) 11.5 - 15.5 %   Platelets 183 150 - 400 K/uL   nRBC 0.2 0.0 - 0.2 %    Comment: Performed at [facility], [address], [place], [place] [address]  Glucose, capillary     Status: Abnormal   Collection Time: 06/02/22  7:51 AM  Result Value Ref Range   Glucose-Capillary 182 (H) 70 - 99 mg/dL    Comment: Glucose reference range applies only to samples taken after fasting for at least 8 hours.  Blood gas, arterial     Status: Abnormal   Collection Time: 06/02/22  9:20 AM  Result Value Ref Range   FIO2 44 %   pH, Arterial 7.49 (H) 7.35 - 7.45   pCO2 arterial 59 (H) 32 -  48 mmHg   pO2, Arterial 80 (L) 83 - 108 mmHg   Bicarbonate 44.5 (H) 20.0 - 28.0 mmol/L   Acid-Base Excess 18.1 (H) 0.0 - 2.0 mmol/L   O2 Saturation 99.6 %   Patient temperature 37.8    Collection site RIGHT RADIAL    Drawn by [id number]    Allens test (pass/fail) PASS PASS    Comment: Performed at [facility], [address], [place], [place] [address]  Glucose, capillary     Status: Abnormal   Collection Time: 06/02/22 11:36 AM  Result Value Ref Range   Glucose-Capillary 140 (H) 70 - 99 mg/dL    Comment: Glucose reference range applies only to samples taken after fasting for at least 8 hours.  Ammonia     Status: None   Collection Time: 06/02/22  1:42 PM  Result Value Ref Range   Ammonia 24 9 - 35 umol/L    Comment: Performed at [facility], [address], [place], [place] [address]  Glucose, capillary     Status: None   Collection Time: 06/02/22  4:17 PM  Result Value Ref Range   Glucose-Capillary 95 70 - 99 mg/dL    Comment: Glucose reference range applies only to samples taken after fasting for at least 8 hours.    MICRO:  IMAGING: CT Head Wo Contrast  Result Date: 05/31/2022 CLINICAL DATA:  Mental status change, found down EXAM: CT HEAD WITHOUT CONTRAST TECHNIQUE: Contiguous axial images were obtained from the base of the skull through the vertex without intravenous contrast.  RADIATION DOSE REDUCTION: This exam was performed according to the departmental dose-optimization program which includes automated exposure control, adjustment of the mA and/or kV according to patient size and/or use of iterative reconstruction technique. COMPARISON:  CT head 05/15/2022 FINDINGS: Brain: No intracranial hemorrhage, mass effect, or evidence of acute infarct. No hydrocephalus. No extra-axial fluid collection. Vascular: No hyperdense vessel or unexpected calcification. Skull: No fracture or focal lesion. Sinuses/Orbits: No acute finding. Minimal mucosal thickening in the maxillary sinuses. Other: None. IMPRESSION: No acute intracranial abnormality. Electronically Signed   By: Placido Sou M.D.   On: 05/31/2022 19:14   DG Chest Port 1 View  Result Date: 05/31/2022 CLINICAL DATA:  [place] out of chair, found down EXAM: PORTABLE CHEST 1 VIEW COMPARISON:  05/21/2022 FINDINGS: Three frontal views of the chest are obtained. Cardiac silhouette is stable. Background emphysema again noted without airspace disease, effusion, or pneumothorax. Stable left suprahilar prominence consistent with known endobronchial malignancy. No acute bony abnormality. IMPRESSION: 1. Stable left suprahilar prominence consistent with known left upper lobe endobronchial malignancy. Please refer to previous CT 05/15/2022. 2. Emphysema.  No acute airspace disease. Electronically Signed   By: Randa Ngo M.D.   On: 05/31/2022 18:37   DG Tibia/Fibula Left  Result Date: 05/31/2022 CLINICAL DATA:  Found down, possible sepsis EXAM: LEFT TIBIA AND FIBULA - 2 VIEW COMPARISON:  None Available. FINDINGS: There is no evidence of fracture or other focal bone lesions. Bandage material about the anterior and lateral lower leg. IMPRESSION: No fracture or dislocation of the left tibia or fibula. Bandage material about the anterior and lateral lower leg. Electronically Signed   By: Delanna Ahmadi M.D.   On: 05/31/2022 18:37    HISTORICAL  MICRO/IMAGING  Assessment/Plan:  61yo M with NSCLCa recently admitted for copd exacerbation and infected leg wound, readmitted in 3-4 days since last discharge. - now found to have sepsis due to candidemia. Numerous skin lesions do not necessarily  look like the source of infection (the back lesions has exudate but not significant erythema)  - continue on micafungin - plan to repeat blood cx in 48hrs - will recommend to get TTE for now - multiple pressure wounds = continue with local wound care - if blood cx are NGTD @ 72hr, would recommend to d/c vanco and cefepime, and keep only on micafungin  Dr Juleen China to follow up tomorrow  Caren Griffins [facility] [phone]

## 2022-06-02 NOTE — Progress Notes (Signed)
In to evaluate pt. Arrived to find pt lying in bed, very tense with shaking of foot. Eyes open and makes eye contact but no verbal response when spoken to. Pt will shake head yes and no. Resp rate 21/min, slightly labored with accessory muscle use. Breath sounds decreased bilaterally with some congestion noted right upper chest. Pt with weak congested cough, non-productive. Pt on 6 lpm [place] with SaO2 reading 90%. Apical HR 100/min with corresponding distal pulses. Skin warm and dry. EKG performed by assigned nurse. Pt then stated, "Please y'all, help me!" Pt will not state what he wants help with, reminded pt that MD has been in to see him, O2 increased, labs drawn and we are awaiting updated order. Pt shakes head yes.

## 2022-06-02 NOTE — TOC Initial Note (Signed)
Transition of Care [facility]) - Initial/Assessment Note    Patient Details  Name: Tony Sullivan MRN: [id number] Date of Birth: 09-02-1961  Transition of [facility]) CM/SW Contact:    Iona Beard, [place] Phone Number: 06/02/2022, 12:00 PM  Clinical Narrative:                 Pt is high risk for readmission. Pt is well known to TOC.   Per TOC transition note 7/31 TOC Spoke with patient's daughter, [name]. She advised that patient is not homeless. Patient does not live alone. He has a friend who lives with him and helps him out in the home. He has a PCS aide 80 hours per month. He wants CAP services however he does not have a PCP to sign off on the orders. Patient has previously been provided PCP list. [name] states that patient frequently misses medical appointments because he will not get up for early morning appointments despite her telling him that he has to get up for appointments. Per daughter, patient has not been being forthcoming during this hospitalization. She states that patient gets upset when he is discharged from the hospital.  Discussed appointment with wound care center. [name] stated that they have already contacted her and that she advised that she would contact them in return when he is discharged. Discussed that patient will likely be d/c tomorrow.   Expected Discharge Plan: Home/Self Care Barriers to Discharge: Continued Medical Work up   Patient Goals and CMS Choice Patient states their goals for this hospitalization and ongoing recovery are:: return home      Expected Discharge Plan and Services Expected Discharge Plan: Home/Self Care     Post Acute Care Choice: Resumption of Svcs/PTA Provider Living arrangements for the past 2 months: Apartment                                      Prior Living Arrangements/Services Living arrangements for the past 2 months: Apartment                Current home services: DME    Activities of Daily  Living      Permission Sought/Granted                  Emotional Assessment              Admission diagnosis:  Encephalopathy [G93.40] Acute encephalopathy [G93.40] Hypothermia, initial encounter [T68.XXXA] Patient Active Problem List   Diagnosis Date Noted   Encephalopathy 05/31/2022   Hypothermia 05/31/2022   Acute encephalopathy 05/31/2022   Substance abuse ([place]) 05/31/2022   Leg wound, left 05/31/2022   Goals of care, counseling/discussion 05/26/2022   Decubitus ulcer of back, stage 2 (HCC)    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)    Acute and chronic respiratory failure with hypoxia ([place]) 05/06/2022   Blistered skin right foot/left leg 05/06/2022   Acquired thrombophilia ([place]) 05/06/2022   Pressure injury of skin 05/06/2022   Acute on chronic respiratory failure with hypoxia and hypercapnia (HCC) 04/29/2022   Hyperkalemia 04/29/2022   AKI (acute kidney injury) ([place]) 04/29/2022   Non-small cell lung cancer ([place]) 04/29/2022   History of pulmonary embolus (PE) 04/29/2022   H/o Pulmonary embolism ([place]) 04/01/2022   Mass of upper lobe of left lung    Thoracic compression fracture ([place]) 03/20/2022   Hemoptysis 03/20/2022  Pneumonia 03/13/2022   COPD exacerbation ([place]) 02/27/2022   Pedal edema 02/19/2022   CAP (community acquired pneumonia) 01/07/2022   Prolonged QT interval 01/07/2022   Glaucoma 01/07/2022   Elevated troponin 10/27/2021   Hyperglycemia 10/27/2021   Elevated MCV 10/27/2021   Respiratory failure with hypoxia and hypercapnia ([place]) 10/27/2021   Acute exacerbation of chronic obstructive pulmonary disease (COPD) ([place]) 10/02/2021   Hypophosphatemia 10/02/2021   Physical deconditioning 07/26/2021   Numbness and tingling in left arm 05/06/2021   Leukocytosis 05/05/2021   Severe headache 05/05/2021   Mixed hyperlipidemia 05/24/2020   Hypokalemia 05/24/2020   Chest pain 05/23/2020   Bipolar disorder ([place]) 05/23/2020    HNP (herniated nucleus pulposus), cervical 09/06/2019   Abdominal pain    Poor dentition 11/04/2017   Acute on chronic respiratory failure with hypercapnia ([place]) 08/29/2017   ETOH abuse 03/30/2017   Cigarette smoker 03/30/2017   Acute respiratory failure with hypoxia and hypercapnia (HCC) 03/29/2017   Special screening for malignant neoplasms, colon    Benign neoplasm of ascending colon    Benign neoplasm of descending colon    Hoarseness 08/22/2016   GERD (gastroesophageal reflux disease) 08/22/2016   Atypical chest pain 12/05/2015   Essential hypertension 06/15/2014   Pectoralis muscle strain 01/11/2014   COPD (chronic obstructive pulmonary disease) ([place]) 01/08/2014   Tobacco abuse 01/08/2014   Chronic Cough 01/08/2014   PCP:  [name], FNP Pharmacy:   [facility], [address] [place] [address] Phone: [phone] Fax: [phone]     Social Determinants of Health (SDOH) Interventions    Readmission Risk Interventions    06/02/2022   11:59 AM 05/06/2022   11:49 AM 05/01/2022    7:51 AM  Readmission Risk Prevention Plan  Transportation Screening Complete Complete Complete  Medication Review Press photographer) Complete Complete Complete  HRI or Home Care Consult Complete Complete Complete  SW Recovery Care/Counseling Consult Complete Complete Complete  Palliative Care Screening Not Applicable Not Applicable Not [place] Not Applicable Not Applicable Not Applicable

## 2022-06-02 NOTE — Progress Notes (Signed)
Pt lying in bed, shaking of both feet, arms, head. Eyes open makes eye contacts occasional attempt to mumble when spoken to. Accessory muscle use respirations 24 per min. O2 sat low to mid 90s on 5L HFNC HR 120s after breathing treatment given decreased to 100s MD informed EKG placed in chart. Lab called to inform yeast found in one of patients blood cultures. MD informed. Cleansed LLE wound with NS open to air for MD to assess then will redress. Will continue to monitor patient. Bed in lowest position call light within reach. Frequent rounding's on patient.
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
## 2022-06-02 NOTE — Progress Notes (Signed)
   06/02/22 1500  Assess: MEWS Score  BP 117/82  MAP (mmHg) 89  Pulse Rate (!) 111  Resp (!) 28  Level of Consciousness Alert  SpO2 94 %  O2 Device HFNC  O2 Flow Rate (L/min) 5 L/min  Assess: MEWS Score  MEWS Temp 0  MEWS Systolic 0  MEWS Pulse 2  MEWS RR 2  MEWS LOC 0  MEWS Score 4  MEWS Score Color Red  Assess: if the MEWS score is Yellow or Red  Were vital signs taken at a resting state? Yes  Focused Assessment Change from prior assessment (see assessment flowsheet)  Does the patient meet 2 or more of the SIRS criteria? Yes  Does the patient have a confirmed or suspected source of infection? Yes  Provider and Rapid Response Notified? No  MEWS guidelines implemented *See Row Information* No, previously red, continue vital signs every 4 hours  Treat  Pain Scale 0-10  Pain Score 0  Muscle Tension 2  Take Vital Signs  Increase Vital Sign Frequency  Yellow: Q 2hr X 2 then Q 4hr X 2, if remains yellow, continue Q 4hrs  Escalate  MEWS: Escalate Yellow: discuss with charge nurse/RN and consider discussing with provider and RRT  Notify: Charge Nurse/RN  Name of Charge Nurse/RN Notified Audrea Muscat RN  Date Charge Nurse/RN Notified 06/02/22  Time Charge Nurse/RN Notified 1533  Notify: Provider  Provider Name/Title Dr Roderic Palau  Date Provider Notified 06/02/22  Time Provider Notified 1531  Method of Notification Page (secure chat)  Notification Reason Other (Comment) (red mews)  Provider response See new orders  Date of Provider Response 06/02/22  Time of Provider Response 1534  Assess: SIRS CRITERIA  SIRS Temperature  0  SIRS Pulse 1  SIRS Respirations  1  SIRS WBC 1  SIRS Score Sum  3

## 2022-06-03 ENCOUNTER — Other Ambulatory Visit: Payer: Medicaid Other | Admitting: Nurse Practitioner

## 2022-06-03 ENCOUNTER — Inpatient Hospital Stay (HOSPITAL_COMMUNITY): Payer: Medicaid Other

## 2022-06-03 DIAGNOSIS — R7881 Bacteremia: Secondary | ICD-10-CM

## 2022-06-03 DIAGNOSIS — J449 Chronic obstructive pulmonary disease, unspecified: Secondary | ICD-10-CM | POA: Diagnosis not present

## 2022-06-03 DIAGNOSIS — F319 Bipolar disorder, unspecified: Secondary | ICD-10-CM | POA: Diagnosis not present

## 2022-06-03 DIAGNOSIS — G9340 Encephalopathy, unspecified: Secondary | ICD-10-CM | POA: Diagnosis not present

## 2022-06-03 DIAGNOSIS — B377 Candidal sepsis: Secondary | ICD-10-CM | POA: Diagnosis not present

## 2022-06-03 LAB — CBC
HCT: 34.4 % — ABNORMAL LOW (ref 39.0–52.0)
Hemoglobin: 10.1 g/dL — ABNORMAL LOW (ref 13.0–17.0)
MCH: 30.1 pg (ref 26.0–34.0)
MCHC: 29.4 g/dL — ABNORMAL LOW (ref 30.0–36.0)
MCV: 102.4 fL — ABNORMAL HIGH (ref 80.0–100.0)
Platelets: 156 10*3/uL (ref 150–400)
RBC: 3.36 MIL/uL — ABNORMAL LOW (ref 4.22–5.81)
RDW: 16.7 % — ABNORMAL HIGH (ref 11.5–15.5)
WBC: 8.5 10*3/uL (ref 4.0–10.5)
nRBC: 0 % (ref 0.0–0.2)

## 2022-06-03 LAB — BASIC METABOLIC PANEL
Anion gap: 10 (ref 5–15)
BUN: 14 mg/dL (ref 6–20)
CO2: 33 mmol/L — ABNORMAL HIGH (ref 22–32)
Calcium: 7.5 mg/dL — ABNORMAL LOW (ref 8.9–10.3)
Chloride: 100 mmol/L (ref 98–111)
Creatinine, Ser: 0.58 mg/dL — ABNORMAL LOW (ref 0.61–1.24)
GFR, Estimated: >60 mL/min (ref >60)
Glucose, Bld: 112 mg/dL — ABNORMAL HIGH (ref 70–99)
Potassium: 3.6 mmol/L (ref 3.5–5.1)
Sodium: 143 mmol/L (ref 135–145)

## 2022-06-03 LAB — ECHOCARDIOGRAM COMPLETE
AR max vel: 2.88 cm2
AV Area VTI: 2.49 cm2
AV Area mean vel: 2.94 cm2
AV Mean grad: 2 mmHg
AV Peak grad: 5.8 mmHg
Ao pk vel: 1.2 m/s
Area-P 1/2: 2.46 cm2
Calc EF: 57 %
Height: 71 in
MV VTI: 2.36 cm2
S' Lateral: 2.7 cm
Single Plane A2C EF: 63.6 %
Single Plane A4C EF: 56.3 %
Weight: 2095.25 oz

## 2022-06-03 LAB — GLUCOSE, CAPILLARY
Glucose-Capillary: 121 mg/dL — ABNORMAL HIGH (ref 70–99)
Glucose-Capillary: 101 mg/dL — ABNORMAL HIGH (ref 70–99)
Glucose-Capillary: 124 mg/dL — ABNORMAL HIGH (ref 70–99)
Glucose-Capillary: 96 mg/dL (ref 70–99)
Glucose-Capillary: 137 mg/dL — ABNORMAL HIGH (ref 70–99)

## 2022-06-03 LAB — MAGNESIUM: Magnesium: 1.8 mg/dL (ref 1.7–2.4)

## 2022-06-03 LAB — PHOSPHORUS
Phosphorus: 4.2 mg/dL (ref 2.5–4.6)
Phosphorus: 4.2 mg/dL (ref 2.5–4.6)

## 2022-06-03 MED ORDER — MORPHINE SULFATE (PF) 2 MG/ML IV SOLN: 2.0000 mg | INTRAVENOUS | Status: AC

## 2022-06-03 MED ORDER — MORPHINE SULFATE (PF) 2 MG/ML IV SOLN
1.0000 mg | INTRAVENOUS | Status: DC | PRN
  Administered 2022-06-03 – 2022-06-12 (×39): 1 mg via INTRAVENOUS
  Filled 2022-06-03 (×40): qty 1

## 2022-06-03 MED ORDER — MORPHINE SULFATE (PF) 2 MG/ML IV SOLN
INTRAVENOUS | Status: AC
  Administered 2022-06-03: 2 mg via INTRAVENOUS
  Filled 2022-06-03: qty 1

## 2022-06-03 MED ORDER — LOXAPINE SUCCINATE 5 MG PO CAPS
10.0000 mg | ORAL_CAPSULE | Freq: Every day | ORAL | Status: DC
Start: 2022-06-03 — End: 2022-06-12
  Administered 2022-06-04 – 2022-06-11 (×6): 10 mg via ORAL
  Filled 2022-06-03: qty 1
  Filled 2022-06-03: qty 2
  Filled 2022-06-03 (×2): qty 1
  Filled 2022-06-03 (×4): qty 2
  Filled 2022-06-03: qty 1
  Filled 2022-06-03 (×2): qty 2

## 2022-06-03 MED ORDER — BENZTROPINE MESYLATE 1 MG PO TABS
0.5000 mg | ORAL_TABLET | Freq: Every day | ORAL | Status: DC
  Administered 2022-06-04 – 2022-06-11 (×6): 0.5 mg via ORAL
  Filled 2022-06-03 (×8): qty 1

## 2022-06-03 NOTE — Progress Notes (Signed)
Patient took Brovona  and Pulmicort neb.  Did not give  duoneb. Refused to wear BiPAP at this time. Watching TV.

## 2022-06-03 NOTE — Progress Notes (Signed)
Infectious Disease Follow-up Note:  Patient chart reviewed.  TTE pending.  05/31/22 blood cx = C albicans, sensitivity pending.  Micafungin started 06/02/22.  Will continue micafungin.  Stop vancomycin and cefepime.  Repeat blood cultures on 06/04/22.   Raynelle Highland for Infectious Disease [facility] 06/03/2022, 9:49 AM
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
## 2022-06-03 NOTE — Progress Notes (Signed)
Patient refuses BIPAP at night. Unit at bedside if he changes his mind.

## 2022-06-03 NOTE — Progress Notes (Signed)
[facility]) Hospital Liaison note:  This patient is currently enrolled in [facility] outpatient-based Palliative Care. Will continue to follow for disposition.  Please call with any outpatient palliative questions or concerns.  Thank you, Lorelee Market, LPN [facility] Liaison [phone]

## 2022-06-04 DIAGNOSIS — F1010 Alcohol abuse, uncomplicated: Secondary | ICD-10-CM

## 2022-06-04 DIAGNOSIS — Z515 Encounter for palliative care: Secondary | ICD-10-CM | POA: Diagnosis not present

## 2022-06-04 DIAGNOSIS — Z7189 Other specified counseling: Secondary | ICD-10-CM | POA: Diagnosis not present

## 2022-06-04 DIAGNOSIS — Z66 Do not resuscitate: Secondary | ICD-10-CM

## 2022-06-04 DIAGNOSIS — L89102 Pressure ulcer of unspecified part of back, stage 2: Secondary | ICD-10-CM

## 2022-06-04 DIAGNOSIS — T68XXXA Hypothermia, initial encounter: Secondary | ICD-10-CM | POA: Diagnosis not present

## 2022-06-04 DIAGNOSIS — J449 Chronic obstructive pulmonary disease, unspecified: Secondary | ICD-10-CM | POA: Diagnosis not present

## 2022-06-04 DIAGNOSIS — G9340 Encephalopathy, unspecified: Secondary | ICD-10-CM | POA: Diagnosis not present

## 2022-06-04 DIAGNOSIS — C3490 Malignant neoplasm of unspecified part of unspecified bronchus or lung: Secondary | ICD-10-CM

## 2022-06-04 DIAGNOSIS — I2694 Multiple subsegmental pulmonary emboli without acute cor pulmonale: Secondary | ICD-10-CM

## 2022-06-04 DIAGNOSIS — B377 Candidal sepsis: Secondary | ICD-10-CM | POA: Diagnosis not present

## 2022-06-04 DIAGNOSIS — F319 Bipolar disorder, unspecified: Secondary | ICD-10-CM | POA: Diagnosis not present

## 2022-06-04 LAB — BASIC METABOLIC PANEL
Anion gap: 9 (ref 5–15)
BUN: 16 mg/dL (ref 6–20)
CO2: 32 mmol/L (ref 22–32)
Calcium: 7.7 mg/dL — ABNORMAL LOW (ref 8.9–10.3)
Chloride: 99 mmol/L (ref 98–111)
Creatinine, Ser: 0.62 mg/dL (ref 0.61–1.24)
GFR, Estimated: >60 mL/min (ref >60)
Glucose, Bld: 119 mg/dL — ABNORMAL HIGH (ref 70–99)
Potassium: 3.3 mmol/L — ABNORMAL LOW (ref 3.5–5.1)
Sodium: 140 mmol/L (ref 135–145)

## 2022-06-04 LAB — GLUCOSE, CAPILLARY
Glucose-Capillary: 117 mg/dL — ABNORMAL HIGH (ref 70–99)
Glucose-Capillary: 119 mg/dL — ABNORMAL HIGH (ref 70–99)
Glucose-Capillary: 123 mg/dL — ABNORMAL HIGH (ref 70–99)
Glucose-Capillary: 135 mg/dL — ABNORMAL HIGH (ref 70–99)
Glucose-Capillary: 144 mg/dL — ABNORMAL HIGH (ref 70–99)
Glucose-Capillary: 167 mg/dL — ABNORMAL HIGH (ref 70–99)

## 2022-06-04 LAB — CBC
HCT: 29.5 % — ABNORMAL LOW (ref 39.0–52.0)
Hemoglobin: 9 g/dL — ABNORMAL LOW (ref 13.0–17.0)
MCH: 30.8 pg (ref 26.0–34.0)
MCHC: 30.5 g/dL (ref 30.0–36.0)
MCV: 101 fL — ABNORMAL HIGH (ref 80.0–100.0)
Platelets: 160 10*3/uL (ref 150–400)
RBC: 2.92 MIL/uL — ABNORMAL LOW (ref 4.22–5.81)
RDW: 16.1 % — ABNORMAL HIGH (ref 11.5–15.5)
WBC: 10.4 10*3/uL (ref 4.0–10.5)
nRBC: 0 % (ref 0.0–0.2)

## 2022-06-04 LAB — PHOSPHORUS: Phosphorus: 2 mg/dL — ABNORMAL LOW (ref 2.5–4.6)

## 2022-06-04 LAB — MAGNESIUM: Magnesium: 1.8 mg/dL (ref 1.7–2.4)

## 2022-06-04 MED ORDER — DEXTROSE 5 % IV SOLN
15.0000 mmol | Freq: Once | INTRAVENOUS | Status: AC
Start: 2022-06-04 — End: 2022-06-04
  Administered 2022-06-04: 15 mmol via INTRAVENOUS
  Filled 2022-06-04: qty 5

## 2022-06-04 MED ORDER — PREDNISONE 20 MG PO TABS
40.0000 mg | ORAL_TABLET | Freq: Every day | ORAL | Status: DC
  Administered 2022-06-05 – 2022-06-07 (×3): 40 mg via ORAL
  Filled 2022-06-04 (×3): qty 2

## 2022-06-04 MED ORDER — APIXABAN 5 MG PO TABS
5.0000 mg | ORAL_TABLET | Freq: Two times a day (BID) | ORAL | Status: DC
  Administered 2022-06-04 – 2022-06-12 (×16): 5 mg via ORAL
  Filled 2022-06-04 (×16): qty 1

## 2022-06-04 MED ORDER — FLUCONAZOLE 100 MG PO TABS
400.0000 mg | ORAL_TABLET | Freq: Every day | ORAL | Status: DC
  Administered 2022-06-05 – 2022-06-12 (×8): 400 mg via ORAL
  Filled 2022-06-04: qty 2
  Filled 2022-06-04 (×9): qty 4
  Filled 2022-06-04 (×2): qty 2

## 2022-06-04 MED ORDER — HYDROXYZINE HCL 25 MG PO TABS
25.0000 mg | ORAL_TABLET | Freq: Three times a day (TID) | ORAL | Status: DC | PRN
  Administered 2022-06-04 – 2022-06-12 (×10): 25 mg via ORAL
  Filled 2022-06-04 (×10): qty 1

## 2022-06-04 MED ORDER — GUAIFENESIN ER 600 MG PO TB12
600.0000 mg | ORAL_TABLET | Freq: Two times a day (BID) | ORAL | Status: DC
  Administered 2022-06-04 – 2022-06-10 (×13): 600 mg via ORAL
  Filled 2022-06-04 (×13): qty 1

## 2022-06-04 NOTE — Progress Notes (Addendum)
      [facility] for Infectious Disease  Date of Admission:  05/31/2022           Reason for visit: Follow up on candidemia  Current antibiotics: Micafungin   ASSESSMENT:    61 y.o. male admitted with:  Candidemia: currently on micafungin.  No obvious source of candidemia as skin wounds do not necessarily look like source based on images.  No indwelling central line but ? Possibility of PIV related given recent admission.  No way to tell for sure.  TTE was negative for vegetation.  Afebrile and leukocytosis has resolved.  Non small cell lung cancer Acute encephalopathy and Polysubstance use: UDS at admission positive for cocaine and opiates.  Mental status slowly improved per the chart. Multiple pressure wounds: Ongoing wound care.  RECOMMENDATIONS:    Repeat EKG to look at QTc.  Was > 500 when last checked yesterday. Will switch to high dose fluconazole from micafungin if repeat EKG okay Follow up repeat cultures Plan for 2 weeks from today as long as cultures remain negative.  End date = 06/18/22 Should have outpatient eye exam to exclude candidal involvement  ADDENDUM 3:20 PM : -- QTc looks okay so will switch to fluconazole and sign off for now.  Please call as needed.    Raynelle Highland for Infectious Disease [facility] [phone] pager 06/04/2022, 12:20 PM  I have spent >5 min reviewing chart, formulating plan, and discussing with team.

## 2022-06-04 NOTE — Progress Notes (Signed)
PT Cancellation Note  Patient Details Name: SHELBY ANDERLE MRN: [id number] DOB: 03-16-1961   Cancelled Treatment:    Reason Eval/Treat Not Completed: Pain limiting ability to participate.  PT states that he is having chest pains and wants to begin therapy tomorrow.  PT states that he does not remember what happened prior to becoming unconscious.   Rayetta Humphrey, [place] CLT [phone]  06/04/2022, 4:07 PM

## 2022-06-04 NOTE — Consult Note (Signed)
Palliative Care Consult Note                                  Date: 06/04/2022   Patient Name: Tony Sullivan  DOB: [birth year]  MRN: [id number]  Age / Sex: 61 y.o., male  PCP: Diamantina Providence, FNP Referring Physician: Erick Blinks, MD  Reason for Consultation: Establishing goals of care  HPI/Patient Profile: 61 y.o. male  with past medical history of COPD, chronic respiratory failure on 4.5 L at home, anxiety/depression, hypertension, pulmonary embolus (03/13/2022 CTA chest) on apixaban, NSCLC diagnosed 03/22/2022 via bronchoscopy (Dr. Vassie Loll), polysubstance abuse.  He was brought in by EMS altered/obtunded, recent discharge 3 days ago, found unresponsive on the ground.  He was hypothermic, hypoxic, CT with no acute findings, elevated white blood cell count of 15.  UDS was positive for cocaine and opiates.   He was admitted on 05/31/2022 with acute toxic encephalopathy likely secondary to polysubstance abuse, sepsis secondary to candidemia, non-small cell lung cancer, acute on chronic respiratory failure with hypoxia, COPD, pressure injury, and others.  PMT was consulted for goals of care conversations.  Initial conversations by Dr. Kerry Hough with the hospitalist team with patient's family.  Discussed poor prognosis and multiple admissions (11 within the past 5 months).  According to patient's brother Tony Sullivan at his last admission the patient told of his brother that he would not want to be on a ventilator.  After further discussion family agreed to DNR status.  Past Medical History:  Diagnosis Date   Anxiety    Arthritis    COPD (chronic obstructive pulmonary disease) (HCC) 2004   diagnosed in 2004, no PFT's to date.  Started on home O2 12/2013, after found to be desatting at PCP's office, and referred to pulmonology.   Depression    GERD (gastroesophageal reflux disease)    High blood pressure    Prediabetes    Seasonal allergies    Substance abuse  (HCC)    Tobacco dependence     Subjective:   This NP Wynne Dust reviewed medical records, received report from team, assessed the patient and then meet at the patient's bedside to discuss diagnosis, prognosis, GOC, EOL wishes disposition and options.  I met with the patient at the bedside along with Dr. Kerry Hough.   Concept of Palliative Care was introduced as specialized medical care for people and their families living with serious illness.  If focuses on providing relief from the symptoms and stress of a serious illness.  The goal is to improve quality of life for both the patient and the family. Values and goals of care important to patient and family were attempted to be elicited.  Created space and opportunity for patient  and family to explore thoughts and feelings regarding current medical situation   Natural trajectory and current clinical status were discussed. Questions and concerns addressed. Patient  encouraged to call with questions or concerns.    Patient/Family Understanding of Illness: He understands he has a lot of health problems.  He knows he has sepsis due to candidemia and blood cultures were drawn today and he needs to wait a couple more days in the hospital for those to be negative before he can be discharged.  He is concerned about some of his chronic/home medications not being ordered in the hospital.  Dr. Kerry Hough has reiterated that they have ordered those today.  He will we reviewed  a list in order anything he can that is not already been restarted.  Life Review: Deferred  Patient Values: Deferred  Goals: Get better and discharged home  Today's Discussion: We had multiple discussions with Dr. Roderic Palau and myself about his chronic illnesses and plan of care.  The goal is to allow him to discharge home in a couple days once his blood cultures are negative.  ID is following.  We discussed the need for ongoing palliative care.  He states it is with [facility] but they  have not been out to see him.  I told him I would call [facility] and requested they come out and see him after discharge, as soon as possible.  He is aware he needs palliative care support to help manage his chronic conditions.  I provided emotional and general support through therapeutic listening, empathy, sharing of stories, and other techniques. I answered all questions and addressed all concerns to the best of my ability.   Review of Systems  Constitutional:  Positive for fatigue.       Wants to get up and moving aruond  Respiratory:  Negative for cough and shortness of breath.   Gastrointestinal:  Negative for abdominal pain, nausea and vomiting.    Objective:   Primary Diagnoses: Present on Admission:  Encephalopathy  Essential hypertension  GERD (gastroesophageal reflux disease)  Bipolar disorder (HCC)  H/o Pulmonary embolism (HCC)  COPD (chronic obstructive pulmonary disease) (HCC)  Tobacco abuse  ETOH abuse  Non-small cell lung cancer ([place])  Decubitus ulcer of back, stage 2 ([place])  Acute encephalopathy   Physical Exam Vitals and nursing note reviewed.  Constitutional:      General: He is not in acute distress.    Appearance: He is ill-appearing.  HENT:     Head: Normocephalic and atraumatic.  Eyes:     Comments: Right eye bloodshot and inflamed  Cardiovascular:     Rate and Rhythm: Normal rate.  Pulmonary:     Effort: Pulmonary effort is normal. No respiratory distress.  Abdominal:     General: Abdomen is flat.     Palpations: Abdomen is soft.  Skin:    General: Skin is warm and dry.  Neurological:     General: No focal deficit present.  Psychiatric:        Mood and Affect: Mood normal.        Behavior: Behavior normal.     Vital Signs:  BP (!) 135/90   Pulse 89   Temp 98.4 F (36.9 C) (Oral)   Resp 16   Ht $R'5\' 11"'ZS$  (1.803 m)   Wt 59.4 kg   SpO2 97%   BMI 18.26 kg/m   Palliative Assessment/Data: 40-50%    Advanced Care Planning:    Primary Decision Maker: PATIENT  Code Status/Advance Care Planning: DNR  A discussion was had today regarding advanced directives. Concepts specific to code status, artifical feeding and hydration, continued IV antibiotics and rehospitalization was had.  The difference between a aggressive medical intervention path and a palliative comfort care path for this patient at this time was had.   Decisions/Changes to ACP: None today  Assessment & Plan:   Impression: 61 year old male with multiple chronic and acute comorbidities as documented above.  He is admitted for the 11th time in 5 months.  He is again septic, now with candidemia and ID is on board.  Appreciate their recommendations.  He came in obtunded and confused, UDS positive for cocaine.  He was initially unresponsive  but today he seems much more interactive.  He has been diagnosed with non-small cell lung cancer and was supposed to start radiation at some point, but this is yet to happen.  Goals of care with family and agreed to DNR.  The patient understands his multiple chronic comorbidities and is asking for outpatient palliative care to continue discussions.  Given his social history and chronic conditions his overall prognosis is poor.  Concerned about repeated admissions.  SUMMARY OF RECOMMENDATIONS   Remain DNR Continue current scope of care otherwise Anticipate discharge in 48 to 72 hours when blood cultures are back PMT will follow peripherally Please notify us of any new needs or significant clinical changes  Symptom Management:  Per primary team PMT is available to assist as needed  Prognosis:  Unable to determine  Discharge Planning:  To Be Determined   Discussed with: Patient, medical team, nursing team    Thank you for allowing [place] to participate in the care of [name] PMT will continue to support holistically.  Billing based on MDM: High  Problems Addressed: One acute or chronic illness or injury  that poses a threat to life or bodily function  Amount and/or Complexity of Data: Category 1:Review of prior external note(s) from each unique source and Review of the result(s) of each unique test and Category 3:Discussion of management or test interpretation with external physician/other qualified health care professional/appropriate source (not separately reported) (reviewed OP palliative phone note, outpatient Palliative visit notes, current labs (prelim blood cultures, CBC and CMP)  Risks: Decision not to resuscitate or to de-escalate care because of poor prognosis   Signed by: Walden Field, NP Palliative Medicine Team  Team Phone # [phone] (Nights/Weekends)  06/04/2022, 1:53 PM

## 2022-06-04 NOTE — Progress Notes (Signed)
Pt refused BIPAP, machine at bedside
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
## 2022-06-04 NOTE — Progress Notes (Signed)
Patient refused morning dressing changes, states he does not want these done in the morning.  Dressings CDI.

## 2022-06-05 DIAGNOSIS — G9340 Encephalopathy, unspecified: Secondary | ICD-10-CM | POA: Diagnosis not present

## 2022-06-05 LAB — BASIC METABOLIC PANEL
Anion gap: 8 (ref 5–15)
BUN: 13 mg/dL (ref 6–20)
CO2: 33 mmol/L — ABNORMAL HIGH (ref 22–32)
Calcium: 7.9 mg/dL — ABNORMAL LOW (ref 8.9–10.3)
Chloride: 97 mmol/L — ABNORMAL LOW (ref 98–111)
Creatinine, Ser: 0.55 mg/dL — ABNORMAL LOW (ref 0.61–1.24)
GFR, Estimated: >60 mL/min (ref >60)
Glucose, Bld: 121 mg/dL — ABNORMAL HIGH (ref 70–99)
Potassium: 3.5 mmol/L (ref 3.5–5.1)
Sodium: 138 mmol/L (ref 135–145)

## 2022-06-05 LAB — CULTURE, BLOOD (ROUTINE X 2)
Culture: NO GROWTH
Special Requests: ADEQUATE

## 2022-06-05 LAB — CBC
HCT: 29.5 % — ABNORMAL LOW (ref 39.0–52.0)
Hemoglobin: 8.9 g/dL — ABNORMAL LOW (ref 13.0–17.0)
MCH: 30.5 pg (ref 26.0–34.0)
MCHC: 30.2 g/dL (ref 30.0–36.0)
MCV: 101 fL — ABNORMAL HIGH (ref 80.0–100.0)
Platelets: 167 10*3/uL (ref 150–400)
RBC: 2.92 MIL/uL — ABNORMAL LOW (ref 4.22–5.81)
RDW: 15.7 % — ABNORMAL HIGH (ref 11.5–15.5)
WBC: 10 10*3/uL (ref 4.0–10.5)
nRBC: 0 % (ref 0.0–0.2)

## 2022-06-05 LAB — GLUCOSE, CAPILLARY
Glucose-Capillary: 146 mg/dL — ABNORMAL HIGH (ref 70–99)
Glucose-Capillary: 119 mg/dL — ABNORMAL HIGH (ref 70–99)
Glucose-Capillary: 140 mg/dL — ABNORMAL HIGH (ref 70–99)
Glucose-Capillary: 141 mg/dL — ABNORMAL HIGH (ref 70–99)

## 2022-06-05 MED ORDER — POTASSIUM CHLORIDE CRYS ER 20 MEQ PO TBCR
40.0000 meq | EXTENDED_RELEASE_TABLET | Freq: Once | ORAL | Status: AC
  Administered 2022-06-05: 40 meq via ORAL
  Filled 2022-06-05: qty 2

## 2022-06-05 MED ORDER — THIAMINE HCL 100 MG PO TABS
100.0000 mg | ORAL_TABLET | Freq: Every day | ORAL | Status: DC
  Administered 2022-06-05 – 2022-06-12 (×8): 100 mg via ORAL
  Filled 2022-06-05 (×8): qty 1

## 2022-06-05 NOTE — Progress Notes (Signed)
[facility] Progress Note  Patient: Tony Sullivan    CHE:[id number]  DOA: 05/31/2022     Date of Service: the patient was seen and examined on 06/05/2022  Chief Complaint  Patient presents with   Altered Mental Status   Brief hospital course: Tony Sullivan  is a 61 y.o. male, with a history of COPD, chronic respiratory failure on 4.5 L at home, anxiety/depression, hypertension, pulmonary embolus (03/13/2022 CTA chest) on apixaban, NSCLC diagnosed 03/22/2022 via bronchoscopy (Dr. Elsworth Soho) -Patient was brought by EMS, he is altered/obtunded, unable to provide any history so it was obtained by ED physician, patient was just admitted to the hospital within the last week, he was discharged 3 days ago, he is with known history of polysubstance abuse, was brought to the hospital today after he was found unresponsive on the ground, it does appear patient was seen most recently 4 hours normal prior to this event, he lives with a roommate, and his cousin visits him frequently in the house, apparently patient was found on the ground by his motorized scooter, he had a laceration of his left leg and was virtually unresponsive, he was found normoglycemic, EKG showing normal sinus rhythm, oxygen level not repeated when he came to ED was 75%, as reported there was Foley catheter next to the bed unclear if patient is using Foley catheter or not. -In ED patient was obtunded, unresponsive and hypothermic at 102 Fahrenheit, CT head with no acute finding, lactic acid was elevated, white blood cell elevated at 15, it was started empirically on broad-spectrum antibiotic, no evidence of UTI on UA, this x-ray with significant finding of known malignancy and emphysema, urine drug screen positive for cocaine and opiates.  [facility] consulted to admit.     Assessment and Plan:  Principal Problem:   Encephalopathy Active Problems:   COPD (chronic obstructive pulmonary disease) (HCC)   H/o Pulmonary embolism (HCC)    Tobacco abuse   Essential hypertension   GERD (gastroesophageal reflux disease)   ETOH abuse   Bipolar disorder (HCC)   Non-small cell lung cancer (HCC)   Decubitus ulcer of back, stage 2 (HCC)   Hypothermia       Acute toxic encephalopathy Polysubstance abuse -UDS positive for cocaine and opiates -CT head with no acute findings. -ABG shows a PCO2 near baseline, pH is compensated -Ammonia normal -Mentation is pretty close to baseline -Continue to hold gabapentin    Sepsis secondary to candidemia -Present on admission -Noted to be hypothermic, leukocytosis, altered mental status and elevated lactic acid on admission -Infectious disease consulted, appreciate input -Vancomycin and cefepime discontinued by ID -Started on micafungin 8/6 -Repeat cultures ordered on 8/8 -2D echocardiogram did not show any valvular vegetations -Sepsis pathophysiology has resolved -ID recommends high-dose fluconazole for 2 weeks last dose 06/19/2022  -By ID physician okay to discharge home if repeat blood cultures from 06/04/2022 remains negative on 06/07/2022 -Should have outpatient eye exam/ophthalmology visit to exclude candidal involvement    History of PE -Continue on Eliquis     Non-small cell lung cancer -He was supposed to start radiation therapy as an outpatient per recent discharge summary on 05/28/22 -This will likely need to be rescheduled due to his current illness   Acute on chronic respiratory failure with hypoxia -He is on 4 to 5 L nasal cannula, at baseline -He is supposed to be on BiPAP therapy nightly, but family does report noncompliance with this -Patient refusing BiPAP in the hospital -Continue management of COPD  COPD/tobacco abuse -Oxygen dependent -Has diminished breath sounds and increased work of breathing -Currently on inhaled bronchodilators and inhaled steroids -Treated with IV steroids, -Reports that he is on chronic prednisone, he is not entirely sure of his  dose Continue prednisone  -Patient is not interested in smoking cessation  Pressure Injury of skin/sacral pressure ulcer/left leg laceration -Multiple ulcers, sacral pressure ulcers -Left leg laceration stapled by ED physician, discussed with ED physician, staples will need to stay for at least 14 days -Wound care input appreciated   Bipolar disorder -Continue on loxapine and Cogentin  -Chronic indwelling Foley---POA -History of urinary retention -Per EMS patient had Foley catheter which appears was pulled when patient was found on the floor, so Foley catheter was reinserted in ED without difficulty on 05/31/22 -Continue to maintain current Foley which was present prior to admission      Goals of care: -Patient was seen by palliative medicine last admission. Overall his long-term prognosis appears poor with multiple admissions, this is his 11th admission within 41-month -Dr Roderic Palau discussed his goals of care with his brother, father and daughter.  They all seem to understand his advanced illness and poor prognosis. -Patient's brother, Nicole Kindred, reports that during patient's last admission, he had told his brother that he would not want to be on a ventilator - They agree with DNR status. -DNR status verified and confirmed with patient on 06/05/2022    DVT Prophylaxis Eliquis   Body mass index is 18.26 kg/m.  Interventions:    Pressure Injury 05/06/22 Vertebral column Lower Unstageable - Full thickness tissue loss in which the base of the injury is covered by slough (yellow, tan, gray, green or brown) and/or eschar (tan, brown or black) in the wound bed. some smaller Unstageab (Active)  05/06/22 1130  Location: Vertebral column  Location Orientation: Lower  Staging: Unstageable - Full thickness tissue loss in which the base of the injury is covered by slough (yellow, tan, gray, green or brown) and/or eschar (tan, brown or black) in the wound bed.  Wound Description (Comments): some  smaller Unstageable wounds surround the larger one also  Present on Admission: Yes  Dressing Type Moist to dry;Pressure dressing 06/04/22 1100     Pressure Injury 05/06/22 Coccyx Medial Stage 2 -  Partial thickness loss of dermis presenting as a shallow open injury with a red, pink wound bed without slough. (Active)  05/06/22 1130  Location: Coccyx  Location Orientation: Medial  Staging: Stage 2 -  Partial thickness loss of dermis presenting as a shallow open injury with a red, pink wound bed without slough.  Wound Description (Comments):   Present on Admission: Yes  Dressing Type Foam - Lift dressing to assess site every shift 06/04/22 1100     Diet: NPO DVT Prophylaxis: Subcutaneous Lovenox, therapeutic dose  Advance goals of care discussion: DNR  Family Communication: Updated patient's daughter on 8/8  Disposition:  -Per ID physician possible discharge on 06/07/2022 if repeat blood cultures from 06/04/2022 remains negative   Subjective:  -Mentation has improved significantly -Oral intake is fair  Physical Exam:  Gen:- Awake Alert, in no acute distress , speaking in sentences HEENT:- [place].AT, No sclera icterus Nose- [place] 4L/min Neck-Supple Neck,No JVD,.  Lungs-fair air movement, no wheezing or rhonchi  CV- S1, S2 normal, RRR Abd-  +ve B.Sounds, Abd Soft, No tenderness,    Extremity/Skin:- trace  edema,   good pedal pulses  Psych-affect is appropriate, oriented x3 Neuro-generalized weakness, no new focal deficits, no tremors  Vitals:   06/05/22 0454 06/05/22 0758 06/05/22 0801 06/05/22 0802  BP: 129/85     Pulse: 78     Resp: 19     Temp: 98.7 F (37.1 C)     TempSrc:      SpO2: 100% 91% 91% 91%  Weight:      Height:        Intake/Output Summary (Last 24 hours) at 06/05/2022 1350 Last data filed at 06/05/2022 0900 Gross per 24 hour  Intake 960 ml  Output 650 ml  Net 310 ml   Filed Weights   05/31/22 1811 05/31/22 2300 06/01/22 0441  Weight: 59.2 kg 59.5 kg 59.4  kg    Data Reviewed:  CBC: Recent Labs  Lab 05/31/22 1815 06/01/22 0547 06/02/22 0537 06/03/22 0423 06/04/22 0446 06/05/22 0511  WBC 15.6* 11.5* 12.0* 8.5 10.4 10.0  NEUTROABS 14.1*  --   --   --   --   --   HGB 10.7* 9.4* 10.0* 10.1* 9.0* 8.9*  HCT 37.4* 31.9* 33.7* 34.4* 29.5* 29.5*  MCV 105.9* 105.6* 104.0* 102.4* 101.0* 101.0*  PLT 237 203 183 156 160 202   Basic Metabolic Panel: Recent Labs  Lab 06/01/22 0547 06/01/22 0643 06/02/22 0428 06/03/22 0423 06/04/22 0446 06/05/22 0511  NA 146*  --  143 143 140 138  K 3.9  --  3.5 3.6 3.3* 3.5  CL 102  --  103 100 99 97*  CO2 34*  --  35* 33* 32 33*  GLUCOSE 54*  --  145* 112* 119* 121*  BUN 30*  --  14 14 16 13   CREATININE 0.67  --  0.65 0.58* 0.62 0.55*  CALCIUM 8.5*  --  8.1* 7.5* 7.7* 7.9*  MG  --  2.1 1.8 1.8 1.8  --   PHOS 2.9  --  1.5* 4.2  4.2 2.0*  --     Studies: No results found.  Scheduled Meds:  apixaban  5 mg Oral BID   arformoterol  15 mcg Nebulization BID   benztropine  0.5 mg Oral QHS   budesonide (PULMICORT) nebulizer solution  0.25 mg Nebulization BID   Chlorhexidine Gluconate Cloth  6 each Topical Daily   collagenase   Topical Daily   dorzolamide  1 drop Left Eye BID   fluconazole  400 mg Oral Daily   guaiFENesin  600 mg Oral BID   ipratropium-albuterol  3 mL Nebulization Q6H WA   iron polysaccharides  150 mg Oral Daily   latanoprost  1 drop Both Eyes QHS   loxapine  10 mg Oral QHS   mupirocin ointment  1 Application Topical Daily   potassium chloride  40 mEq Oral Once   predniSONE  40 mg Oral Q breakfast   silver sulfADIAZINE   Topical Daily   thiamine  100 mg Oral Daily   Vitamin D (Ergocalciferol)  50,000 Units Oral Q7 days   Continuous Infusions:  PRN Meds: acetaminophen **OR** acetaminophen, albuterol, hydrALAZINE, hydrOXYzine, metoprolol tartrate, morphine injection, ondansetron **OR** ondansetron (ZOFRAN) IV  Author: [name], MD  [facility] 06/05/2022 1:50  PM  To reach On-call, see care teams to locate the attending and reach out to them via [email or web address]. If 7PM-7AM, please contact night-coverage If you still have difficulty reaching the attending provider, please page the [facility] (Director on Call) for [facility] on amion for assistance.

## 2022-06-05 NOTE — Evaluation (Signed)
Physical Therapy Evaluation Patient Details Name: Tony Sullivan MRN: [id number] DOB: [birth year] Today's Date: 06/05/2022  History of Present Illness  Tony Sullivan  is a 61 y.o. male, with a history of COPD, chronic respiratory failure on 4.5 L at home, anxiety/depression, hypertension, pulmonary embolus (03/13/2022 CTA chest) on apixaban, NSCLC diagnosed 03/22/2022 via bronchoscopy (Dr. Elsworth Soho)  -Patient was brought by EMS, he is altered/obtunded, unable to provide any history so it was obtained by ED physician, patient was just admitted to the hospital within the last week, he was discharged 3 days ago, he is with known history of polysubstance abuse, was brought to the hospital today after he was found unresponsive on the ground, it does appear patient was seen most recently 4 hours normal prior to this event, he lives with a roommate, and his cousin visits him frequently in the house, apparently patient was found on the ground by his motorized scooter, he had a laceration of his left leg and was virtually unresponsive, he was found normoglycemic, EKG showing normal sinus rhythm, oxygen level not repeated when he came to ED was 75%, as reported there was Foley catheter next to the bed unclear if patient is using Foley catheter or not.   Clinical Impression  Patient requires much encouragement to participate with therapy and tolerated sitting up at bedside for a few seconds before having to lie back down and stating he cannot tolerate therapy - RN present in room.  Patient states he requires 2 person assist for transferring to his electric scooter at home.  Patient encouraged to get up with nursing staff as tolerated.  Plan:  Patient discharged from physical therapy to care of nursing for out of bed as tolerated if patient is compliant for length of stay.        Recommendations for follow up therapy are one component of a multi-disciplinary discharge planning process, led by the attending physician.   Recommendations may be updated based on patient status, additional functional criteria and insurance authorization.  Follow Up Recommendations No PT follow up Can patient physically be transported by private vehicle: Yes    Assistance Recommended at Discharge Set up Supervision/Assistance  Patient can return home with the following  A lot of help with walking and/or transfers;Assistance with cooking/housework;Help with stairs or ramp for entrance;A little help with bathing/dressing/bathroom    Equipment Recommendations None recommended by PT  Recommendations for Other Services       Functional Status Assessment Patient has had a recent decline in their functional status and/or demonstrates limited ability to make significant improvements in function in a reasonable and predictable amount of time     Precautions / Restrictions Precautions Precautions: Fall Restrictions Weight Bearing Restrictions: No      Mobility  Bed Mobility Overal bed mobility: Needs Assistance Bed Mobility: Supine to Sit, Sit to Supine     Supine to sit: Min assist, Mod assist Sit to supine: Min assist, Mod assist   General bed mobility comments: increased time, labored movement    Transfers                        Ambulation/Gait                  Stairs            Wheelchair Mobility    Modified Rankin (Stroke Patients Only)       Balance Overall balance assessment: Needs assistance Sitting-balance support: Feet supported,  Bilateral upper extremity supported Sitting balance-Leahy Scale: Fair Sitting balance - Comments: seated at EOB                                     Pertinent Vitals/Pain Pain Assessment Pain Assessment: No/denies pain    Home Living Family/patient expects to be discharged to:: Private residence Living Arrangements: Alone Available Help at Discharge: Personal care attendant;Friend(s) Type of Home: House Home Access: Ramped  entrance       Home Layout: Able to live on main level with bedroom/bathroom Home Equipment: Rolling Walker (2 wheels);Cane - single point;Wheelchair - power;BSC/3in1;Wheelchair - manual Additional Comments: aide available 3hr/day 7 days/week;    Prior Function Prior Level of Function : Needs assist       Physical Assist : Mobility (physical);ADLs (physical) Mobility (physical): Bed mobility;Transfers;Gait;Stairs   Mobility Comments: Patient states he now requires 2 person to assist to his power scooter, non-ambulatory ADLs Comments: assisted by home aides and family     Hand Dominance   Dominant Hand: Right    Extremity/Trunk Assessment   Upper Extremity Assessment Upper Extremity Assessment: Generalized weakness    Lower Extremity Assessment Lower Extremity Assessment: Generalized weakness    Cervical / Trunk Assessment Cervical / Trunk Assessment: Normal  Communication   Communication: No difficulties  Cognition Arousal/Alertness: Awake/alert Behavior During Therapy: WFL for tasks assessed/performed Overall Cognitive Status: Within Functional Limits for tasks assessed                                          General Comments      Exercises     Assessment/Plan    PT Assessment Patient does not need any further PT services  PT Problem List Decreased strength;Decreased activity tolerance;Decreased balance;Decreased mobility;Decreased skin integrity;Pain       PT Treatment Interventions      PT Goals (Current goals can be found in the Care Plan section)  Acute Rehab PT Goals Patient Stated Goal: return home with caregivers to assist PT Goal Formulation: With patient Time For Goal Achievement: 06/05/22 Potential to Achieve Goals: Good    Frequency       Co-evaluation               AM-PAC PT "6 Clicks" Mobility  Outcome Measure Help needed turning from your back to your side while in a flat bed without using bedrails?: A  Little Help needed moving from lying on your back to sitting on the side of a flat bed without using bedrails?: A Lot Help needed moving to and from a bed to a chair (including a wheelchair)?: Total Help needed standing up from a chair using your arms (e.g., wheelchair or bedside chair)?: Total Help needed to walk in hospital room?: Total Help needed climbing 3-5 steps with a railing? : Total 6 Click Score: 9    End of Session Equipment Utilized During Treatment: Oxygen Activity Tolerance: Patient limited by fatigue;Other (comment) (Patient limited secondary to difficulty breathing) Patient left: in bed;with call bell/phone within reach Nurse Communication: Mobility status PT Visit Diagnosis: Muscle weakness (generalized) (M62.81);History of falling (Z91.81);Unsteadiness on feet (R26.81);Other abnormalities of gait and mobility (R26.89)    Time: 2563-8937 PT Time Calculation (min) (ACUTE ONLY): 20 min   Charges:   PT Evaluation $PT Eval Moderate Complexity: 1 Mod  PT Treatments $Therapeutic Activity: 8-22 mins        2:54 PM, 06/05/22 Lonell Grandchild, MPT Physical Therapist with [facility] 336 [phone] office [phone] mobile phone

## 2022-06-06 ENCOUNTER — Ambulatory Visit: Payer: Medicaid Other

## 2022-06-06 ENCOUNTER — Ambulatory Visit: Payer: Medicaid Other | Admitting: Radiation Oncology

## 2022-06-06 DIAGNOSIS — Z515 Encounter for palliative care: Secondary | ICD-10-CM

## 2022-06-06 DIAGNOSIS — F319 Bipolar disorder, unspecified: Secondary | ICD-10-CM | POA: Diagnosis not present

## 2022-06-06 DIAGNOSIS — C3490 Malignant neoplasm of unspecified part of unspecified bronchus or lung: Secondary | ICD-10-CM

## 2022-06-06 DIAGNOSIS — I2694 Multiple subsegmental pulmonary emboli without acute cor pulmonale: Secondary | ICD-10-CM

## 2022-06-06 DIAGNOSIS — F1010 Alcohol abuse, uncomplicated: Secondary | ICD-10-CM

## 2022-06-06 DIAGNOSIS — K219 Gastro-esophageal reflux disease without esophagitis: Secondary | ICD-10-CM

## 2022-06-06 DIAGNOSIS — L89102 Pressure ulcer of unspecified part of back, stage 2: Secondary | ICD-10-CM

## 2022-06-06 DIAGNOSIS — G9340 Encephalopathy, unspecified: Secondary | ICD-10-CM | POA: Diagnosis not present

## 2022-06-06 DIAGNOSIS — Z7189 Other specified counseling: Secondary | ICD-10-CM

## 2022-06-06 LAB — BASIC METABOLIC PANEL
Anion gap: 6 (ref 5–15)
BUN: 13 mg/dL (ref 6–20)
CO2: 34 mmol/L — ABNORMAL HIGH (ref 22–32)
Calcium: 8 mg/dL — ABNORMAL LOW (ref 8.9–10.3)
Chloride: 98 mmol/L (ref 98–111)
Creatinine, Ser: 0.6 mg/dL — ABNORMAL LOW (ref 0.61–1.24)
GFR, Estimated: >60 mL/min (ref >60)
Glucose, Bld: 110 mg/dL — ABNORMAL HIGH (ref 70–99)
Potassium: 3.7 mmol/L (ref 3.5–5.1)
Sodium: 138 mmol/L (ref 135–145)

## 2022-06-06 LAB — CBC
HCT: 25.6 % — ABNORMAL LOW (ref 39.0–52.0)
Hemoglobin: 7.8 g/dL — ABNORMAL LOW (ref 13.0–17.0)
MCH: 30.8 pg (ref 26.0–34.0)
MCHC: 30.5 g/dL (ref 30.0–36.0)
MCV: 101.2 fL — ABNORMAL HIGH (ref 80.0–100.0)
Platelets: 170 10*3/uL (ref 150–400)
RBC: 2.53 MIL/uL — ABNORMAL LOW (ref 4.22–5.81)
RDW: 15.5 % (ref 11.5–15.5)
WBC: 8.8 10*3/uL (ref 4.0–10.5)
nRBC: 0 % (ref 0.0–0.2)

## 2022-06-06 MED ORDER — IPRATROPIUM-ALBUTEROL 0.5-2.5 (3) MG/3ML IN SOLN
3.0000 mL | Freq: Three times a day (TID) | RESPIRATORY_TRACT | Status: DC
  Administered 2022-06-06 – 2022-06-07 (×4): 3 mL via RESPIRATORY_TRACT
  Filled 2022-06-06 (×4): qty 3

## 2022-06-06 MED ORDER — LORAZEPAM 1 MG PO TABS
1.0000 mg | ORAL_TABLET | Freq: Four times a day (QID) | ORAL | Status: DC | PRN
  Administered 2022-06-06: 1 mg via ORAL
  Filled 2022-06-06: qty 1

## 2022-06-06 NOTE — Progress Notes (Signed)
Daily Progress Note   Patient Name: Tony Sullivan       Date: 06/06/2022 DOB: May 11, 1961  Age: 61 y.o. MRN#: [id number] Attending Physician: [name], MD Primary Care Physician: [name], FNP Admit Date: 05/31/2022 Length of Stay: 6 days  Reason for Consultation/Follow-up: Establishing goals of care  HPI/Patient Profile:  61 y.o. male  with past medical history of COPD, chronic respiratory failure on 4.5 L at home, anxiety/depression, hypertension, pulmonary embolus (03/13/2022 CTA chest) on apixaban, NSCLC diagnosed 03/22/2022 via bronchoscopy (Dr. Elsworth Soho), polysubstance abuse.  He was brought in by EMS altered/obtunded, recent discharge 3 days ago, found unresponsive on the ground.  He was hypothermic, hypoxic, CT with no acute findings, elevated white blood cell count of 15.  UDS was positive for cocaine and opiates.    He was admitted on 05/31/2022 with acute toxic encephalopathy likely secondary to polysubstance abuse, sepsis secondary to candidemia, non-small cell lung cancer, acute on chronic respiratory failure with hypoxia, COPD, pressure injury, and others.   PMT was consulted for goals of care conversations.  Initial conversations by Dr. Roderic Palau with the hospitalist team with patient's family.  Discussed poor prognosis and multiple admissions (11 within the past 5 months).  According to patient's brother Nicole Kindred at his last admission the patient told of his brother that he would not want to be on a ventilator.  After further discussion family agreed to DNR status.  Subjective:   Subjective: Chart Reviewed. Updates received. Patient Assessed. Created space and opportunity for patient  and family to explore thoughts and feelings regarding current medical situation.  Today's Discussion: Today met with the patient at the bedside.  He seems to be breathing a bit harder.  The nurses he has had some breathing difficulties with any activity including simply try to get up to the bedside  chair since yesterday.  We discussed his CODE STATUS as he told the nurse last night that he did not want DNR and ripped the bracelet off his wrist.  I provided further information on the likely success of resuscitation given his chronic conditions and his overall health.  He states he wants to be a full code.  He seems pretty insistent on this.  He states that he only wants his daughter to make his decisions.  I informed him that when he was confused his daughter, given how sick he was when he came in, elected for the DNR status along with input from other family.  He asked me about talking to the hospitalist about trying to send him to rehab.  He feels that he needs SNF/rehab in order to get stronger.  I told him that I would bring it up with the hospitalist but I could not guarantee any change in current planned discharge disposition.  He verbalized understanding.  He denies any other complaints today.  I provided emotional and general support through therapeutic listening, empathy, sharing of stories, and other techniques. I answered all questions and addressed all concerns to the best of my ability.   Review of Systems  Constitutional:  Positive for fatigue.  Respiratory:  Positive for shortness of breath.   Gastrointestinal:  Negative for anal bleeding, nausea and vomiting.  Neurological:  Positive for weakness.    Objective:   Vital Signs:  BP (!) 144/88 (BP Location: Left Arm)   Pulse 84   Temp 98 F (36.7 C)   Resp 19   Ht $R'5\' 11"'ZX$  (1.803 m)   Wt 59.4 kg  SpO2 100%   BMI 18.26 kg/m   Physical Exam: Physical Exam Vitals and nursing note reviewed.  Constitutional:      General: He is not in acute distress.    Appearance: He is ill-appearing.  HENT:     Head: Normocephalic and atraumatic.  Pulmonary:     Effort: Tachypnea present.     Breath sounds: No wheezing or rhonchi.  Abdominal:     General: Abdomen is flat.     Palpations: Abdomen is soft.  Skin:    General:  Skin is warm and dry.  Neurological:     General: No focal deficit present.     Palliative Assessment/Data: 30%   Assessment & Plan:   Impression: Present on Admission:  Encephalopathy  Essential hypertension  GERD (gastroesophageal reflux disease)  Bipolar disorder (HCC)  H/o Pulmonary embolism (HCC)  COPD (chronic obstructive pulmonary disease) (HCC)  Tobacco abuse  ETOH abuse  Non-small cell lung cancer ([place])  Decubitus ulcer of back, stage 2 ([place])  Acute encephalopathy  61 year old male with multiple chronic and acute comorbidities as documented above.  He is admitted for the 11th time in 5 months.  He is again septic, now with candidemia and ID is on board.  Appreciate their recommendations.  He came in obtunded and confused, UDS positive for cocaine.  He was initially unresponsive but has had improvement in this today and yesterday.  Today he is interactive and answers questions, engages in discussion.  He has been diagnosed with non-small cell lung cancer and was supposed to start radiation at some point, but this is yet to happen; appears to be scheduled for sometime after discharge.  Goals of care with family and they agreed to DNR.  However last night apparently the patient became quite agitated he was DNR and requested to be changed to full code.  The patient understands his multiple chronic comorbidities and is asking for outpatient palliative care to continue discussions.  He is also requested SNF/rehab and I have informed the hospitalist.  Given his social history and chronic conditions his overall prognosis is poor.  Concerned about repeated admissions.  SUMMARY OF RECOMMENDATIONS   Changed back to full code per patient wishes Continue current scope of care/full scope of care Anticipate discharge in 69 to 72 hours when blood cultures are back PMT will follow peripherally Please notify us of any new needs or significant clinical changes  Symptom Management:  Per  primary team PMT is available to assist as needed  Code Status: Full code  Prognosis: Unable to determine  Discharge Planning: To Be Determined  Discussed with: Patient, medical team, nursing team, [facility] team  Thank you for allowing [place] to participate in the care of [name] PMT will continue to support holistically.  Billing based on MDM: High  Problems Addressed: One acute or chronic illness or injury that poses a threat to life or bodily function  Amount and/or Complexity of Data: Category 3:Discussion of management or test interpretation with external physician/other qualified health care professional/appropriate source (not separately reported)  Risks: Reengaging with code discussion and decision to transition back to full code   Walden Field, NP Palliative Medicine Team  Team Phone # [phone] (Nights/Weekends)  06/26/2021, 8:17 AM

## 2022-06-06 NOTE — Progress Notes (Signed)
Writer provided wound care dressing changes to left leg and right foot and sacral
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
## 2022-06-06 NOTE — Progress Notes (Signed)
While in pt. Room, pt. Asked what the purple wristband on his arm was. I asked him what did it say, He stated, "It says DNR. Does that mean Do not resusitate?" I answered yes, he then stated, "Take this shit off my arm! I didn't agree to this shit!" He then ripped the wristband off and told me to throw it in the trash. RN notified. MD notified.

## 2022-06-07 ENCOUNTER — Inpatient Hospital Stay (HOSPITAL_COMMUNITY): Payer: Medicaid Other

## 2022-06-07 ENCOUNTER — Other Ambulatory Visit: Payer: Self-pay

## 2022-06-07 ENCOUNTER — Other Ambulatory Visit: Payer: Self-pay | Admitting: Internal Medicine

## 2022-06-07 DIAGNOSIS — J9811 Atelectasis: Secondary | ICD-10-CM

## 2022-06-07 DIAGNOSIS — J9621 Acute and chronic respiratory failure with hypoxia: Secondary | ICD-10-CM | POA: Diagnosis not present

## 2022-06-07 DIAGNOSIS — J9622 Acute and chronic respiratory failure with hypercapnia: Secondary | ICD-10-CM | POA: Diagnosis not present

## 2022-06-07 DIAGNOSIS — J441 Chronic obstructive pulmonary disease with (acute) exacerbation: Secondary | ICD-10-CM

## 2022-06-07 DIAGNOSIS — G9340 Encephalopathy, unspecified: Secondary | ICD-10-CM | POA: Diagnosis not present

## 2022-06-07 LAB — CBC
HCT: 30.7 % — ABNORMAL LOW (ref 39.0–52.0)
Hemoglobin: 9.1 g/dL — ABNORMAL LOW (ref 13.0–17.0)
MCH: 30.6 pg (ref 26.0–34.0)
MCHC: 29.6 g/dL — ABNORMAL LOW (ref 30.0–36.0)
MCV: 103.4 fL — ABNORMAL HIGH (ref 80.0–100.0)
Platelets: 188 10*3/uL (ref 150–400)
RBC: 2.97 MIL/uL — ABNORMAL LOW (ref 4.22–5.81)
RDW: 15.9 % — ABNORMAL HIGH (ref 11.5–15.5)
WBC: 9.6 10*3/uL (ref 4.0–10.5)
nRBC: 0.5 % — ABNORMAL HIGH (ref 0.0–0.2)

## 2022-06-07 LAB — BLOOD GAS, ARTERIAL
Acid-Base Excess: 13.4 mmol/L — ABNORMAL HIGH (ref 0.0–2.0)
Bicarbonate: 38.9 mmol/L — ABNORMAL HIGH (ref 20.0–28.0)
Drawn by: 358491
FIO2: 50 %
O2 Saturation: 95 %
Patient temperature: 37
pCO2 arterial: 51 mmHg — ABNORMAL HIGH (ref 32–48)
pH, Arterial: 7.49 — ABNORMAL HIGH (ref 7.35–7.45)
pO2, Arterial: 63 mmHg — ABNORMAL LOW (ref 83–108)

## 2022-06-07 LAB — ANTIFUNGAL AST 9 DRUG PANEL
Amphotericin B MIC: 0.25
Fluconazole Islt MIC: 1
Flucytosine MIC: 0.12
Itraconazole MIC: 0.25
Posaconazole MIC: 0.12

## 2022-06-07 LAB — CULTURE, BLOOD (ROUTINE X 2): Special Requests: ADEQUATE

## 2022-06-07 MED ORDER — ACETYLCYSTEINE 20 % IN SOLN
4.0000 mL | Freq: Two times a day (BID) | RESPIRATORY_TRACT | Status: AC
  Administered 2022-06-07 – 2022-06-09 (×6): 4 mL via RESPIRATORY_TRACT
  Filled 2022-06-07 (×6): qty 4

## 2022-06-07 MED ORDER — LORAZEPAM 1 MG PO TABS
1.0000 mg | ORAL_TABLET | Freq: Four times a day (QID) | ORAL | Status: DC | PRN
  Administered 2022-06-09 – 2022-06-10 (×2): 1 mg via ORAL
  Filled 2022-06-07 (×2): qty 1

## 2022-06-07 MED ORDER — POLYETHYLENE GLYCOL 3350 17 G PO PACK
17.0000 g | PACK | Freq: Two times a day (BID) | ORAL | Status: DC
  Administered 2022-06-07 – 2022-06-12 (×7): 17 g via ORAL
  Filled 2022-06-07 (×9): qty 1

## 2022-06-07 MED ORDER — DEXTROSE 5 % IV SOLN
250.0000 mg | INTRAVENOUS | Status: DC
  Administered 2022-06-08 – 2022-06-09 (×2): 250 mg via INTRAVENOUS
  Filled 2022-06-07 (×3): qty 2.5

## 2022-06-07 MED ORDER — IPRATROPIUM-ALBUTEROL 0.5-2.5 (3) MG/3ML IN SOLN
3.0000 mL | RESPIRATORY_TRACT | Status: DC
Start: 2022-06-07 — End: 2022-06-08
  Administered 2022-06-07 – 2022-06-08 (×5): 3 mL via RESPIRATORY_TRACT
  Filled 2022-06-07 (×5): qty 3

## 2022-06-07 MED ORDER — BUDESONIDE 0.5 MG/2ML IN SUSP
0.5000 mg | Freq: Two times a day (BID) | RESPIRATORY_TRACT | Status: DC
  Administered 2022-06-07 – 2022-06-12 (×10): 0.5 mg via RESPIRATORY_TRACT
  Filled 2022-06-07 (×10): qty 2

## 2022-06-07 MED ORDER — METHYLPREDNISOLONE SODIUM SUCC 40 MG IJ SOLR
40.0000 mg | Freq: Four times a day (QID) | INTRAMUSCULAR | Status: DC
  Administered 2022-06-07 – 2022-06-10 (×12): 40 mg via INTRAVENOUS
  Filled 2022-06-07 (×12): qty 1

## 2022-06-07 MED ORDER — SODIUM CHLORIDE 0.9 % IV SOLN
500.0000 mg | INTRAVENOUS | Status: AC
  Administered 2022-06-07: 500 mg via INTRAVENOUS
  Filled 2022-06-07: qty 5

## 2022-06-07 NOTE — Consult Note (Signed)
Pulmonary and Critical Care Medicine   Patient name: Tony Sullivan Admit date: 05/31/2022  DOB: 10-Jul-1961 LOS: 7  MRN: [id number] Consult date: 06/07/2022  Referring provider: Dr. Denton Brick, [facility]: Short of breath    History:  61 yo male with COPD and chronic respiratory failure from ongoing tobacco and cocaine abuse with very frequent hospital admissions was brought to [facility] obtunded.  UDS positive for cocaine.  SpO2 in ER was 75%.  He was admitted to medical ward.  His mental status improved.  He then developed worsening dyspnea on 8/11.  Chest xray showed mucus plugging of Lt main bronchus.  PCCM consulted to assist with respiratory management.  Past medical history:  Anxiety, Depression, HTN, PE May 2023, NSCLC dx May 2023  Significant events:  8/04 Admit 8/06 Infected Lt wound with candidemia >> ID consulted  8/07 Palliative care consulted 8/11 Lt lung ATX, transfer to ICU; family d/w pt >> DNR/DNI  Studies:  CT angio chest 05/15/22 >> large endobronchial lesion occupying majority of LUL bronchial tree, severe emphysema Echo 06/03/22 >> EF 55 to 60%, grade 1 DD Lt chest ultrasound 06/07/22 >> small Lt effusion  Micro:  Blood 8/04 Candida albicans Blood 8/08 >>   Lines:     Antibiotics:  Micafungin 8/06 >> 8/08 Diflucan 8/09 >> Zithromax 8/11 >>   Consults:  ID Palliative care    Interim history:    Vital signs:  BP 128/80 (BP Location: Right Arm)   Pulse 97   Temp 98.6 F (37 C)   Resp 20   Ht _0  (1.803 m)   Wt 59.4 kg   SpO2 90%   BMI 18.26 kg/m   Intake/output:  I/O last 3 completed shifts: In: 2400 [P.O.:2400] Out: 1500 [Urine:1500]   Physical exam:   General - alert, using accessory muscles Eyes - pupils reactive ENT - no sinus tenderness, no stridor Cardiac - regular rate/rhythm, no murmur Chest - decreased BS on Lt, scattered rhonchi on the right Abdomen - soft, non tender, + bowel sounds Extremities - no  cyanosis, clubbing, or edema Skin - no rashes Neuro - normal strength, moves extremities, follows commands Psych - normal mood and behavior Lymphatics - no lymphadenopathy GU - no lesions noted  Best practice:   DVT - Eliquis SUP - N/A Nutrition - heart healthy/carb modified    Assessment/plan:   Acute on chronic hypoxic/hypercapnic respiratory failure. - from COPD exacerbation with mucus plugging - DNR/DNI - okay with Bipap if needed - adjust oxygen to keep SpO2 88 to 95%  Acute COPD exacerbation. - pulmicort with scheduled duoneb - solumedrol 40 mg q6h - add zithromax   Mucous plugging versus progression of endobronchial malignancy in Lt main bronchus with complete Lt lung collapse. - he would not want bronchoscopy or intubation - mucomyst bid for 3 days - chest PT - bronchial hygiene  NSCLC (Squamous cell carcinoma) with Lt upper lung endobronchial lesion. - diagnosed in May 2023 - if his lung doesn't re-expand with bronchial hygiene and inhaler therapy, then might need to consider transition to comfort measures  Candidemia in setting of leg wound. - diflucan per ID   Hx of pulmonary embolism. - continue eliquis  Resolved hospital problems:  Acute toxic encephalopathy from cocaine  Goals of care/Family discussions:  Code status: DNR/DNI  Had lengthy discussion with Tony Sullivan and with his daughter on speaker phone.  They are in agreement that his health status has been poor and getting progressively worse.  They are okay with therapies to try and improving his symptoms, including Bipap if needed.  If his status gets worse, they would not want intubation or cardiac resuscitation.  They also would not want to do bronchoscopy again.  D/w Dr. Denton Brick.  Labs:      Latest Ref Rng & Units 06/06/2022    4:38 AM 06/05/2022    5:11 AM 06/04/2022    4:46 AM  CMP  Glucose 70 - 99 mg/dL 110  121  119   BUN 6 - 20 mg/dL _0 Creatinine 0.61 - 1.24 mg/dL 0.60   0.55  0.62   Sodium 135 - 145 mmol/L 138  138  140   Potassium 3.5 - 5.1 mmol/L 3.7  3.5  3.3   Chloride 98 - 111 mmol/L 98  97  99   CO2 22 - 32 mmol/L 34  33  32   Calcium 8.9 - 10.3 mg/dL 8.0  7.9  7.7        Latest Ref Rng & Units 06/07/2022    4:14 AM 06/06/2022    4:38 AM 06/05/2022    5:11 AM  CBC  WBC 4.0 - 10.5 K/uL 9.6  8.8  10.0   Hemoglobin 13.0 - 17.0 g/dL 9.1  7.8  8.9   Hematocrit 39.0 - 52.0 % 30.7  25.6  29.5   Platelets 150 - 400 K/uL 188  170  167     ABG    Component Value Date/Time   PHART 7.49 (H) 06/07/2022 1149   PCO2ART 51 (H) 06/07/2022 1149   PO2ART 63 (L) 06/07/2022 1149   HCO3 38.9 (H) 06/07/2022 1149   TCO2 30 05/05/2021 0244   ACIDBASEDEF 2.0 09/30/2018 0939   O2SAT 95 06/07/2022 1149    CBG (last 3)  Recent Labs    06/05/22 0719 06/05/22 1123 06/05/22 1614  GLUCAP 119* 140* 141*     Past surgical history:  He  has a past surgical history that includes Appendectomy (1980); Hip surgery (Right); Shoulder arthroscopy (Right); Colonoscopy with propofol (N/A, 12/10/2016); Anterior cervical discectomy (09/06/2019); Anterior cervical decomp/discectomy fusion (N/A, 09/06/2019); and Flexible bronchoscopy (Left, 03/22/2022).  Social history:  He  reports that he has been smoking cigarettes. He has a 9.00 pack-year smoking history. He has never used smokeless tobacco. He reports that he does not currently use alcohol after a past usage of about 14.0 - 21.0 standard drinks of alcohol per week. He reports current drug use. Drugs: Marijuana and Cocaine.   Review of systems:  Reviewed and negative  Family history:  His family history includes Allergies in his father, mother, and son; Asthma in his mother; Cancer in his mother; Clotting disorder in his mother; Diabetes in his brother; Emphysema in his father and mother; Heart disease in his mother; Seizures in his brother.    Medications:   No current facility-administered medications on file prior to  encounter.   Current Outpatient Medications on File Prior to Encounter  Medication Sig   acetaminophen (TYLENOL) 325 MG tablet Take 2 tablets (650 mg total) by mouth every 6 (six) hours as needed for mild pain (or Fever >/= 101).   albuterol (PROVENTIL) (2.5 MG/3ML) 0.083% nebulizer solution Take 3 mLs (2.5 mg total) by nebulization every 4 (four) hours as needed for wheezing or shortness of breath.   albuterol (VENTOLIN HFA) 108 (90 Base) MCG/ACT inhaler Inhale 2 puffs into the lungs every 6 (six) hours as needed for wheezing  or shortness of breath.   apixaban (ELIQUIS) 5 MG TABS tablet Take 1 tablet (5 mg total) by mouth 2 (two) times daily.   bacitracin ointment Apply 1 Application topically 2 (two) times daily.   Budeson-Glycopyrrol-Formoterol (BREZTRI AEROSPHERE) 160-9-4.8 MCG/ACT AERO Inhale 2 puffs into the lungs in the morning and at bedtime.   dextromethorphan-guaiFENesin (MUCINEX DM) 30-600 MG 12hr tablet Take 1 tablet by mouth 2 (two) times daily.   dorzolamide (TRUSOPT) 2 % ophthalmic solution Place 1 drop into the left eye 2 (two) times daily.   furosemide (LASIX) 20 MG tablet Take 1 tablet (20 mg total) by mouth daily.   gabapentin (NEURONTIN) 100 MG capsule Take 1 capsule (100 mg total) by mouth 3 (three) times daily.   hydrOXYzine (ATARAX) 25 MG tablet Take 1 tablet (25 mg total) by mouth 3 (three) times daily as needed for anxiety.   latanoprost (XALATAN) 0.005 % ophthalmic solution Place 1 drop into both eyes at bedtime.   loxapine (LOXITANE) 10 MG capsule Take 1 capsule (10 mg total) by mouth at bedtime.   mirtazapine (REMERON) 30 MG tablet Take 1 tablet (30 mg total) by mouth at bedtime.   Multiple Vitamin (MULTIVITAMIN WITH MINERALS) TABS tablet Take 1 tablet by mouth daily.   pantoprazole (PROTONIX) 40 MG tablet Take 1 tablet (40 mg total) by mouth 2 (two) times daily.   polyethylene glycol (MIRALAX / GLYCOLAX) 17 g packet Take 17 g by mouth daily.   senna-docusate  (SENOKOT-S) 8.6-50 MG tablet Take 2 tablets by mouth at bedtime. (Patient taking differently: Take 2 tablets by mouth at bedtime as needed for mild constipation.)   atorvastatin (LIPITOR) 40 MG tablet TAKE ONE TABLET BY MOUTH DAILY (Patient not taking: Reported on 06/02/2022)   benztropine (COGENTIN) 0.5 MG tablet Take 0.5 mg by mouth at bedtime. (Patient not taking: Reported on 06/02/2022)   diclofenac Sodium (VOLTAREN) 1 % GEL Apply 2 g topically 4 (four) times daily as needed (pain). (Patient not taking: Reported on 06/02/2022)   OXYGEN Inhale 4-5 L into the lungs as directed. 4-5 liters with sleep and exertion as needed for low oxygen   predniSONE (DELTASONE) 10 MG tablet Take 6 tablets (60 mg total) by mouth daily with breakfast. And decrease by one tablet daily (Patient not taking: Reported on 06/02/2022)   simethicone (MYLICON) 80 MG chewable tablet Chew 1 tablet (80 mg total) by mouth 4 (four) times daily as needed for flatulence. (Patient not taking: Reported on 06/02/2022)     Critical care time: 40 minutes  Chesley Mires, MD [place] Pager - [phone] 06/07/2022, 2:39 PM

## 2022-06-07 NOTE — Plan of Care (Signed)

## 2022-06-07 NOTE — Progress Notes (Signed)
Patient arrived to floor had large soft BM gave perineal care, removed foley and placed condom cath. Refused MS nurse and this nurse to do dressing changes but cleansed bottom and applied new foam patch to sacral area.

## 2022-06-07 NOTE — Progress Notes (Signed)
OT Cancellation Note  Patient Details Name: Tony Sullivan MRN: [id number] DOB: [birth year]   Cancelled Treatment:    Reason Eval/Treat Not Completed: Patient declined, no reason specified. Pt refused evaluation today. No reason given. Will attempt to see pt later as time permits.    Trana Ressler OT, MOT   Larey Seat 06/07/2022, 8:47 AM
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
## 2022-06-08 DIAGNOSIS — G9340 Encephalopathy, unspecified: Secondary | ICD-10-CM | POA: Diagnosis not present

## 2022-06-08 MED ORDER — IPRATROPIUM-ALBUTEROL 0.5-2.5 (3) MG/3ML IN SOLN
3.0000 mL | Freq: Four times a day (QID) | RESPIRATORY_TRACT | Status: DC
  Administered 2022-06-08 – 2022-06-12 (×17): 3 mL via RESPIRATORY_TRACT
  Filled 2022-06-08 (×17): qty 3
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
## 2022-06-08 NOTE — Progress Notes (Signed)
Bilateral lower legs, coccygeal and back wounds dressings changed per order. Patient was adamant about wanting to wait on the dressings to be changed, but patient was educated on importance of changing dressings on time. Pain medication was provided at time of wound care. Dr. Maurene Capes at bedside to take updated leg wound pictures.

## 2022-06-08 NOTE — Plan of Care (Signed)
  Problem: Education: Goal: Knowledge of General Education information will improve Description: Including pain rating scale, medication(s)/side effects and non-pharmacologic comfort measures 06/08/2022 2358 by Thana Ates, RN Outcome: Progressing 06/08/2022 2356 by Thana Ates, RN Outcome: Progressing   Problem: Health Behavior/Discharge Planning: Goal: Ability to manage health-related needs will improve 06/08/2022 2358 by Thana Ates, RN Outcome: Progressing 06/08/2022 2356 by Thana Ates, RN Outcome: Progressing   Problem: Clinical Measurements: Goal: Ability to maintain clinical measurements within normal limits will improve 06/08/2022 2358 by Thana Ates, RN Outcome: Progressing 06/08/2022 2356 by Thana Ates, RN Outcome: Progressing Goal: Will remain free from infection 06/08/2022 2358 by Thana Ates, RN Outcome: Progressing 06/08/2022 2356 by Thana Ates, RN Outcome: Progressing Goal: Diagnostic test results will improve 06/08/2022 2358 by Thana Ates, RN Outcome: Progressing 06/08/2022 2356 by Thana Ates, RN Outcome: Progressing Goal: Respiratory complications will improve 06/08/2022 2358 by Thana Ates, RN Outcome: Progressing 06/08/2022 2356 by Thana Ates, RN Outcome: Progressing Goal: Cardiovascular complication will be avoided 06/08/2022 2358 by Thana Ates, RN Outcome: Progressing 06/08/2022 2356 by Thana Ates, RN Outcome: Progressing   Problem: Activity: Goal: Risk for activity intolerance will decrease 06/08/2022 2358 by Thana Ates, RN Outcome: Progressing 06/08/2022 2356 by Thana Ates, RN Outcome: Progressing   Problem: Nutrition: Goal: Adequate nutrition will be maintained 06/08/2022 2358 by Thana Ates, RN Outcome: Progressing 06/08/2022 2356 by Thana Ates, RN Outcome: Progressing   Problem: Coping: Goal: Level of anxiety will decrease 06/08/2022  2358 by Thana Ates, RN Outcome: Progressing 06/08/2022 2356 by Thana Ates, RN Outcome: Progressing   Problem: Elimination: Goal: Will not experience complications related to bowel motility 06/08/2022 2358 by Thana Ates, RN Outcome: Progressing 06/08/2022 2356 by Thana Ates, RN Outcome: Progressing Goal: Will not experience complications related to urinary retention 06/08/2022 2358 by Thana Ates, RN Outcome: Progressing 06/08/2022 2356 by Thana Ates, RN Outcome: Progressing   Problem: Pain Managment: Goal: General experience of comfort will improve 06/08/2022 2358 by Thana Ates, RN Outcome: Progressing 06/08/2022 2356 by Thana Ates, RN Outcome: Progressing   Problem: Safety: Goal: Ability to remain free from injury will improve 06/08/2022 2358 by Thana Ates, RN Outcome: Progressing 06/08/2022 2356 by Thana Ates, RN Outcome: Progressing   Problem: Skin Integrity: Goal: Risk for impaired skin integrity will decrease 06/08/2022 2358 by Thana Ates, RN Outcome: Progressing 06/08/2022 2356 by Thana Ates, RN Outcome: Progressing

## 2022-06-08 NOTE — Plan of Care (Signed)

## 2022-06-09 ENCOUNTER — Inpatient Hospital Stay (HOSPITAL_COMMUNITY): Payer: Medicaid Other

## 2022-06-09 DIAGNOSIS — G9340 Encephalopathy, unspecified: Secondary | ICD-10-CM | POA: Diagnosis not present

## 2022-06-09 LAB — CBC
HCT: 24.3 % — ABNORMAL LOW (ref 39.0–52.0)
Hemoglobin: 7.4 g/dL — ABNORMAL LOW (ref 13.0–17.0)
MCH: 30.6 pg (ref 26.0–34.0)
MCHC: 30.5 g/dL (ref 30.0–36.0)
MCV: 100.4 fL — ABNORMAL HIGH (ref 80.0–100.0)
Platelets: 210 10*3/uL (ref 150–400)
RBC: 2.42 MIL/uL — ABNORMAL LOW (ref 4.22–5.81)
RDW: 16.3 % — ABNORMAL HIGH (ref 11.5–15.5)
WBC: 11.7 10*3/uL — ABNORMAL HIGH (ref 4.0–10.5)
nRBC: 0 % (ref 0.0–0.2)

## 2022-06-09 LAB — RENAL FUNCTION PANEL
Albumin: 1.8 g/dL — ABNORMAL LOW (ref 3.5–5.0)
Anion gap: 9 (ref 5–15)
BUN: 18 mg/dL (ref 6–20)
CO2: 31 mmol/L (ref 22–32)
Calcium: 8.2 mg/dL — ABNORMAL LOW (ref 8.9–10.3)
Chloride: 99 mmol/L (ref 98–111)
Creatinine, Ser: 0.58 mg/dL — ABNORMAL LOW (ref 0.61–1.24)
GFR, Estimated: >60 mL/min (ref >60)
Glucose, Bld: 162 mg/dL — ABNORMAL HIGH (ref 70–99)
Phosphorus: 1.8 mg/dL — ABNORMAL LOW (ref 2.5–4.6)
Potassium: 3.7 mmol/L (ref 3.5–5.1)
Sodium: 139 mmol/L (ref 135–145)

## 2022-06-09 LAB — PREPARE RBC (CROSSMATCH)

## 2022-06-09 LAB — ABO/RH: ABO/RH(D): O POS

## 2022-06-09 MED ORDER — SODIUM CHLORIDE 0.9% IV SOLUTION: Freq: Once | INTRAVENOUS | Status: AC

## 2022-06-09 MED ORDER — POTASSIUM PHOSPHATES 15 MMOLE/5ML IV SOLN
15.0000 mmol | Freq: Once | INTRAVENOUS | Status: AC
  Administered 2022-06-09: 15 mmol via INTRAVENOUS
  Filled 2022-06-09: qty 5

## 2022-06-09 MED ORDER — FUROSEMIDE 10 MG/ML IJ SOLN
20.0000 mg | Freq: Once | INTRAMUSCULAR | Status: AC
Start: 2022-06-09 — End: 2022-06-09
  Administered 2022-06-09: 20 mg via INTRAVENOUS
  Filled 2022-06-09: qty 2

## 2022-06-09 NOTE — Progress Notes (Signed)
Patient to receive 1 unit of blood today. Blood consent obtained from patient. Placed in chart 06/09/2022
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
## 2022-06-10 DIAGNOSIS — J441 Chronic obstructive pulmonary disease with (acute) exacerbation: Secondary | ICD-10-CM | POA: Diagnosis not present

## 2022-06-10 DIAGNOSIS — G9340 Encephalopathy, unspecified: Secondary | ICD-10-CM | POA: Diagnosis not present

## 2022-06-10 DIAGNOSIS — I2694 Multiple subsegmental pulmonary emboli without acute cor pulmonale: Secondary | ICD-10-CM | POA: Diagnosis not present

## 2022-06-10 DIAGNOSIS — C3492 Malignant neoplasm of unspecified part of left bronchus or lung: Secondary | ICD-10-CM

## 2022-06-10 LAB — TYPE AND SCREEN
ABO/RH(D): O POS
Antibody Screen: NEGATIVE
Unit division: 0

## 2022-06-10 LAB — CBC
HCT: 28.8 % — ABNORMAL LOW (ref 39.0–52.0)
Hemoglobin: 9 g/dL — ABNORMAL LOW (ref 13.0–17.0)
MCH: 30.7 pg (ref 26.0–34.0)
MCHC: 31.3 g/dL (ref 30.0–36.0)
MCV: 98.3 fL (ref 80.0–100.0)
Platelets: 209 10*3/uL (ref 150–400)
RBC: 2.93 MIL/uL — ABNORMAL LOW (ref 4.22–5.81)
RDW: 18 % — ABNORMAL HIGH (ref 11.5–15.5)
WBC: 12.9 10*3/uL — ABNORMAL HIGH (ref 4.0–10.5)
nRBC: 0.2 % (ref 0.0–0.2)

## 2022-06-10 LAB — BPAM RBC
Blood Product Expiration Date: 202309092359
ISSUE DATE / TIME: 202308130943
Unit Type and Rh: 5100

## 2022-06-10 MED ORDER — GUAIFENESIN ER 600 MG PO TB12
1200.0000 mg | ORAL_TABLET | Freq: Two times a day (BID) | ORAL | Status: DC
  Administered 2022-06-10 – 2022-06-12 (×4): 1200 mg via ORAL
  Filled 2022-06-10 (×4): qty 2

## 2022-06-10 MED ORDER — METHYLPREDNISOLONE SODIUM SUCC 40 MG IJ SOLR
40.0000 mg | Freq: Two times a day (BID) | INTRAMUSCULAR | Status: DC
  Administered 2022-06-10 – 2022-06-12 (×4): 40 mg via INTRAVENOUS
  Filled 2022-06-10 (×4): qty 1

## 2022-06-10 MED ORDER — AZITHROMYCIN 250 MG PO TABS
250.0000 mg | ORAL_TABLET | Freq: Every day | ORAL | Status: AC
  Administered 2022-06-10 – 2022-06-11 (×2): 250 mg via ORAL
  Filled 2022-06-10 (×2): qty 1

## 2022-06-10 MED ORDER — ENSURE ENLIVE PO LIQD
237.0000 mL | Freq: Two times a day (BID) | ORAL | Status: DC
  Administered 2022-06-10 – 2022-06-12 (×5): 237 mL via ORAL

## 2022-06-10 NOTE — Plan of Care (Signed)

## 2022-06-10 NOTE — Progress Notes (Signed)
[facility]   Patient name: Tony Sullivan Admit date: 05/31/2022  DOB: Jul 10, 1961 LOS: 10  MRN: [id number] Consult date: 06/07/2022  Referring provider: Dr. Denton Brick, [facility]: Short of breath    History:  7 yobm active smoker with COPD severe GOLD 3 and chronic respiratory failure from ongoing tobacco and cocaine abuse with [place] completely obstructing LUL beyond takeoff of Lingular segment 03/22/22  with very frequent hospital admissions was brought to [facility] obtunded.  UDS positive for cocaine.  SpO2 in ER was 75%.  He was admitted to medical ward.  His mental status improved.  He then developed worsening dyspnea on 8/11.  Chest xray showed mucus plugging of Lt main bronchus.  PCCM consulted to assist with respiratory management.  Past medical history:  Anxiety, Depression, HTN, PE May 2023, NSCLC dx May 2023  Significant events:  8/04 Admit 8/06 Infected Lt wound with candidemia >> ID consulted  8/07 Palliative care consulted 8/11 Lt lung ATX, transfer to ICU; family d/w pt >> DNR/DNI  Studies:  CT angio chest 05/15/22 >> large endobronchial lesion occupying majority of LUL bronchial tree, severe emphysema Echo 06/03/22 >> EF 55 to 60%, grade 1 DD Lt chest ultrasound 06/07/22 >> small Lt effusion  Micro:  Blood 8/04 Candida albicans Blood 8/08 >> neg x 2   Lines:     Antibiotics:  Micafungin 8/06 >> 8/08 Diflucan 8/09 >> Zithromax 8/11 >>   Consults:  ID Palliative care     Scheduled Meds:  apixaban  5 mg Oral BID   azithromycin  250 mg Oral Daily   benztropine  0.5 mg Oral QHS   budesonide (PULMICORT) nebulizer solution  0.5 mg Nebulization BID   Chlorhexidine Gluconate Cloth  6 each Topical Daily   collagenase   Topical Daily   dorzolamide  1 drop Left Eye BID   feeding supplement  237 mL Oral BID BM   fluconazole  400 mg Oral Daily   guaiFENesin  600 mg Oral BID   ipratropium-albuterol  3 mL Nebulization Q6H    iron polysaccharides  150 mg Oral Daily   latanoprost  1 drop Both Eyes QHS   loxapine  10 mg Oral QHS   methylPREDNISolone (SOLU-MEDROL) injection  40 mg Intravenous Q6H   mupirocin ointment  1 Application Topical Daily   polyethylene glycol  17 g Oral BID   silver sulfADIAZINE   Topical Daily   thiamine  100 mg Oral Daily   Vitamin D (Ergocalciferol)  50,000 Units Oral Q7 days   Continuous Infusions: PRN Meds:.acetaminophen **OR** acetaminophen, albuterol, hydrALAZINE, hydrOXYzine, LORazepam, metoprolol tartrate, morphine injection, ondansetron **OR** ondansetron (ZOFRAN) IV    Interim history:  Midline to L sided ant cp mildly pleuritic nature, better with MS IV   Vital signs:  BP (!) 164/108   Pulse 94   Temp 98.4 F (36.9 C) (Oral)   Resp (!) 22   Ht _0  (1.803 m)   Wt 61.5 kg   SpO2 92%   BMI 18.91 kg/m   Intake/output:  I/O last 3 completed shifts: In: 2815.5 [P.O.:1365; I.V.:38.5; Blood:801; Other:100; IV Piggyback:511] Out: 4025 [Urine:4025]   Physical exam:   Tmax:  99 General appearance:    moderately high increased wob at rest lying flat in bed  At Rest 02 sats  92% on 4lpm NP  No jvd Oropharynx clear,  mucosa nl Neck supple Lungs with very distant BS esp LUL  RRR no s3 or or  sign murmur Abd soft with limitedexcursion / mild clubbing Extr warm with no edema or clubbing noted Neuro  Sensorium nl ,  no apparent motor deficits   Best practice:   DVT - Eliquis SUP - N/A Nutrition - heart healthy/carb modified    Assessment/plan:   Acute on chronic hypoxic/hypercapnic respiratory failure. - from COPD exacerbation with mucus plugging - DNR/DNI - okay with Bipap if needed - adjust oxygen to keep SpO2 88 to 95%  Acute COPD exacerbation. - pulmicort with scheduled duoneb reasonabbe - solumedrol ok to reduce to 40 mg q 12  - continue diflucan/ zithromax   Mucous plugging versus progression of endobronchial malignancy in Lt main bronchus with  complete Lt lung collapse. - no better with mucomyst, not a candidate for RT at this point   NSCLC (Squamous cell carcinoma) with Lt upper lung endobronchial lesion. - diagnosed in May 2023 - transition to comfort care strongly rec and palliative care to see this pm   Candidemia in setting of leg wound. - diflucan per ID   Hx of pulmonary embolism. - continue eliquis  Resolved hospital problems:  Acute toxic encephalopathy from cocaine  Goals of care/Family discussions:  Code status: DNR/DNI  Clearly the  likelihood of prolonging suffering from pulmonary interventions vastly outweighs any reasonable chance of benefit from offering anything else here  because medical science has done all it can in his case both now and in recent months  to restore his health.  Therefore  I don't have any additional recs  except to consider hospice sooner rather than later - paradoxically many patients with respiratory diseases live longer and better once a palliative approach is used in this setting.     Labs:      Latest Ref Rng & Units 06/09/2022    3:34 AM 06/06/2022    4:38 AM 06/05/2022    5:11 AM  CMP  Glucose 70 - 99 mg/dL 162  110  121   BUN 6 - 20 mg/dL _0 Creatinine 0.61 - 1.24 mg/dL 0.58  0.60  0.55   Sodium 135 - 145 mmol/L 139  138  138   Potassium 3.5 - 5.1 mmol/L 3.7  3.7  3.5   Chloride 98 - 111 mmol/L 99  98  97   CO2 22 - 32 mmol/L 31  34  33   Calcium 8.9 - 10.3 mg/dL 8.2  8.0  7.9        Latest Ref Rng & Units 06/10/2022    3:52 AM 06/09/2022    3:34 AM 06/07/2022    4:14 AM  CBC  WBC 4.0 - 10.5 K/uL 12.9  11.7  9.6   Hemoglobin 13.0 - 17.0 g/dL 9.0  7.4  9.1   Hematocrit 39.0 - 52.0 % 28.8  24.3  30.7   Platelets 150 - 400 K/uL 209  210  188     ABG    Component Value Date/Time   PHART 7.49 (H) 06/07/2022 1149   PCO2ART 51 (H) 06/07/2022 1149   PO2ART 63 (L) 06/07/2022 1149   HCO3 38.9 (H) 06/07/2022 1149   TCO2 30 05/05/2021 0244   ACIDBASEDEF 2.0  09/30/2018 0939   O2SAT 95 06/07/2022 1149    CBG (last 3)  No results for input(s): "GLUCAP" in the last 72 hours.    Past surgical history:  He  has a past surgical history that includes Appendectomy (1980); Hip surgery (Right); Shoulder arthroscopy (Right);  Colonoscopy with propofol (N/A, 12/10/2016); Anterior cervical discectomy (09/06/2019); Anterior cervical decomp/discectomy fusion (N/A, 09/06/2019); and Flexible bronchoscopy (Left, 03/22/2022).  Social history:  He  reports that he has been smoking cigarettes. He has a 9.00 pack-year smoking history. He has never used smokeless tobacco. He reports that he does not currently use alcohol after a past usage of about 14.0 - 21.0 standard drinks of alcohol per week. He reports current drug use. Drugs: Marijuana and Cocaine.   Review of systems:  Reviewed and negative  Family history:  His family history includes Allergies in his father, mother, and son; Asthma in his mother; Cancer in his mother; Clotting disorder in his mother; Diabetes in his brother; Emphysema in his father and mother; Heart disease in his mother; Seizures in his brother.    Medications:   No current facility-administered medications on file prior to encounter.   Current Outpatient Medications on File Prior to Encounter  Medication Sig   acetaminophen (TYLENOL) 325 MG tablet Take 2 tablets (650 mg total) by mouth every 6 (six) hours as needed for mild pain (or Fever >/= 101).   albuterol (PROVENTIL) (2.5 MG/3ML) 0.083% nebulizer solution Take 3 mLs (2.5 mg total) by nebulization every 4 (four) hours as needed for wheezing or shortness of breath.   albuterol (VENTOLIN HFA) 108 (90 Base) MCG/ACT inhaler Inhale 2 puffs into the lungs every 6 (six) hours as needed for wheezing or shortness of breath.   apixaban (ELIQUIS) 5 MG TABS tablet Take 1 tablet (5 mg total) by mouth 2 (two) times daily.   bacitracin ointment Apply 1 Application topically 2 (two) times daily.    Budeson-Glycopyrrol-Formoterol (BREZTRI AEROSPHERE) 160-9-4.8 MCG/ACT AERO Inhale 2 puffs into the lungs in the morning and at bedtime.   dextromethorphan-guaiFENesin (MUCINEX DM) 30-600 MG 12hr tablet Take 1 tablet by mouth 2 (two) times daily.   dorzolamide (TRUSOPT) 2 % ophthalmic solution Place 1 drop into the left eye 2 (two) times daily.   furosemide (LASIX) 20 MG tablet Take 1 tablet (20 mg total) by mouth daily.   gabapentin (NEURONTIN) 100 MG capsule Take 1 capsule (100 mg total) by mouth 3 (three) times daily.   hydrOXYzine (ATARAX) 25 MG tablet Take 1 tablet (25 mg total) by mouth 3 (three) times daily as needed for anxiety.   latanoprost (XALATAN) 0.005 % ophthalmic solution Place 1 drop into both eyes at bedtime.   loxapine (LOXITANE) 10 MG capsule Take 1 capsule (10 mg total) by mouth at bedtime.   mirtazapine (REMERON) 30 MG tablet Take 1 tablet (30 mg total) by mouth at bedtime.   Multiple Vitamin (MULTIVITAMIN WITH MINERALS) TABS tablet Take 1 tablet by mouth daily.   pantoprazole (PROTONIX) 40 MG tablet Take 1 tablet (40 mg total) by mouth 2 (two) times daily.   polyethylene glycol (MIRALAX / GLYCOLAX) 17 g packet Take 17 g by mouth daily.   senna-docusate (SENOKOT-S) 8.6-50 MG tablet Take 2 tablets by mouth at bedtime. (Patient taking differently: Take 2 tablets by mouth at bedtime as needed for mild constipation.)   atorvastatin (LIPITOR) 40 MG tablet TAKE ONE TABLET BY MOUTH DAILY (Patient not taking: Reported on 06/02/2022)   benztropine (COGENTIN) 0.5 MG tablet Take 0.5 mg by mouth at bedtime. (Patient not taking: Reported on 06/02/2022)   diclofenac Sodium (VOLTAREN) 1 % GEL Apply 2 g topically 4 (four) times daily as needed (pain). (Patient not taking: Reported on 06/02/2022)   OXYGEN Inhale 4-5 L into the lungs as directed.  4-5 liters with sleep and exertion as needed for low oxygen   predniSONE (DELTASONE) 10 MG tablet Take 6 tablets (60 mg total) by mouth daily with breakfast.  And decrease by one tablet daily (Patient not taking: Reported on 06/02/2022)   simethicone (MYLICON) 80 MG chewable tablet Chew 1 tablet (80 mg total) by mouth 4 (four) times daily as needed for flatulence. (Patient not taking: Reported on 06/02/2022)       Christinia Gully, MD [facility] [place] [phone]   After 7:00 pm call Elink  [phone]

## 2022-06-10 NOTE — Progress Notes (Signed)
Pt placed on BIPAP for the night RT will continue to monitor

## 2022-06-10 NOTE — Consult Note (Deleted)
Pulmonary and Critical Care Medicine   Patient name: Tony Sullivan Admit date: 05/31/2022  DOB: 05/31/1961 LOS: 10  MRN: [id number] Consult date: 06/07/2022  Referring provider: Dr. Emokpae, [facility]: Short of breath    History:  61 yobm active smoker with COPD severe GOLD 3 and chronic respiratory failure from ongoing tobacco and cocaine abuse with [place] completely obstructing LUL beyond takeoff of Lingular segment 03/22/22  with very frequent hospital admissions was brought to [facility] obtunded.  UDS positive for cocaine.  SpO2 in ER was 75%.  He was admitted to medical ward.  His mental status improved.  He then developed worsening dyspnea on 8/11.  Chest xray showed mucus plugging of Lt main bronchus.  PCCM consulted to assist with respiratory management.  Past medical history:  Anxiety, Depression, HTN, PE May 2023, NSCLC dx May 2023  Significant events:  8/04 Admit 8/06 Infected Lt wound with candidemia >> ID consulted  8/07 Palliative care consulted 8/11 Lt lung ATX, transfer to ICU; family d/w pt >> DNR/DNI  Studies:  CT angio chest 05/15/22 >> large endobronchial lesion occupying majority of LUL bronchial tree, severe emphysema Echo 06/03/22 >> EF 55 to 60%, grade 1 DD Lt chest ultrasound 06/07/22 >> small Lt effusion  Micro:  Blood 8/04 Candida albicans Blood 8/08 >> neg x 2   Lines:     Antibiotics:  Micafungin 8/06 >> 8/08 Diflucan 8/09 >> Zithromax 8/11 >>   Consults:  ID Palliative care     Scheduled Meds:  apixaban  5 mg Oral BID   azithromycin  250 mg Oral Daily   benztropine  0.5 mg Oral QHS   budesonide (PULMICORT) nebulizer solution  0.5 mg Nebulization BID   Chlorhexidine Gluconate Cloth  6 each Topical Daily   collagenase   Topical Daily   dorzolamide  1 drop Left Eye BID   feeding supplement  237 mL Oral BID BM   fluconazole  400 mg Oral Daily   guaiFENesin  600 mg Oral BID   ipratropium-albuterol  3 mL Nebulization Q6H    iron polysaccharides  150 mg Oral Daily   latanoprost  1 drop Both Eyes QHS   loxapine  10 mg Oral QHS   methylPREDNISolone (SOLU-MEDROL) injection  40 mg Intravenous Q6H   mupirocin ointment  1 Application Topical Daily   polyethylene glycol  17 g Oral BID   silver sulfADIAZINE   Topical Daily   thiamine  100 mg Oral Daily   Vitamin D (Ergocalciferol)  50,000 Units Oral Q7 days   Continuous Infusions: PRN Meds:.acetaminophen **OR** acetaminophen, albuterol, hydrALAZINE, hydrOXYzine, LORazepam, metoprolol tartrate, morphine injection, ondansetron **OR** ondansetron (ZOFRAN) IV    Interim history:  Midline to L sided ant cp mildly pleuritic nature, better with MS IV   Vital signs:  BP (!) 164/108   Pulse 94   Temp 98.4 F (36.9 C) (Oral)   Resp (!) 22   Ht 5' 11" (1.803 m)   Wt 61.5 kg   SpO2 92%   BMI 18.91 kg/m   Intake/output:  I/O last 3 completed shifts: In: 2815.5 [P.O.:1365; I.V.:38.5; Blood:801; Other:100; IV Piggyback:511] Out: 4025 [Urine:4025]   Physical exam:   Tmax:  99 General appearance:    moderately high increased wob at rest lying flat in bed  At Rest 02 sats  92% on 4lpm NP  No jvd Oropharynx clear,  mucosa nl Neck supple Lungs with very distant BS esp LUL  RRR no s3 or or   sign murmur Abd soft with limitedexcursion / mild clubbing Extr warm with no edema or clubbing noted Neuro  Sensorium nl ,  no apparent motor deficits   Best practice:   DVT - Eliquis SUP - N/A Nutrition - heart healthy/carb modified    Assessment/plan:   Acute on chronic hypoxic/hypercapnic respiratory failure. - from COPD exacerbation with mucus plugging - DNR/DNI - okay with Bipap if needed - adjust oxygen to keep SpO2 88 to 95%  Acute COPD exacerbation. - pulmicort with scheduled duoneb reasonabbe - solumedrol ok to reduce to 40 mg q 12  - continue diflucan/ zithromax   Mucous plugging versus progression of endobronchial malignancy in Lt main bronchus with  complete Lt lung collapse. - no better with mucomyst, not a candidate for RT at this point   NSCLC (Squamous cell carcinoma) with Lt upper lung endobronchial lesion. - diagnosed in May 2023 - transition to comfort care strongly rec and palliative care to see this pm   Candidemia in setting of leg wound. - diflucan per ID   Hx of pulmonary embolism. - continue eliquis  Resolved hospital problems:  Acute toxic encephalopathy from cocaine  Goals of care/Family discussions:  Code status: DNR/DNI  Clearly the  likelihood of prolonging suffering from pulmonary interventions vastly outweighs any reasonable chance of benefit from offering anything else here  because medical science has done all it can in his case both now and in recent months  to restore his health.  Therefore  I don't have any additional recs  except to consider hospice sooner rather than later - paradoxically many patients with respiratory diseases live longer and better once a palliative approach is used in this setting.     Labs:      Latest Ref Rng & Units 06/09/2022    3:34 AM 06/06/2022    4:38 AM 06/05/2022    5:11 AM  CMP  Glucose 70 - 99 mg/dL 162  110  121   BUN 6 - 20 mg/dL _0 Creatinine 0.61 - 1.24 mg/dL 0.58  0.60  0.55   Sodium 135 - 145 mmol/L 139  138  138   Potassium 3.5 - 5.1 mmol/L 3.7  3.7  3.5   Chloride 98 - 111 mmol/L 99  98  97   CO2 22 - 32 mmol/L 31  34  33   Calcium 8.9 - 10.3 mg/dL 8.2  8.0  7.9        Latest Ref Rng & Units 06/10/2022    3:52 AM 06/09/2022    3:34 AM 06/07/2022    4:14 AM  CBC  WBC 4.0 - 10.5 K/uL 12.9  11.7  9.6   Hemoglobin 13.0 - 17.0 g/dL 9.0  7.4  9.1   Hematocrit 39.0 - 52.0 % 28.8  24.3  30.7   Platelets 150 - 400 K/uL 209  210  188     ABG    Component Value Date/Time   PHART 7.49 (H) 06/07/2022 1149   PCO2ART 51 (H) 06/07/2022 1149   PO2ART 63 (L) 06/07/2022 1149   HCO3 38.9 (H) 06/07/2022 1149   TCO2 30 05/05/2021 0244   ACIDBASEDEF 2.0  09/30/2018 0939   O2SAT 95 06/07/2022 1149    CBG (last 3)  No results for input(s): "GLUCAP" in the last 72 hours.    Past surgical history:  He  has a past surgical history that includes Appendectomy (1980); Hip surgery (Right); Shoulder arthroscopy (Right);  Colonoscopy with propofol (N/A, 12/10/2016); Anterior cervical discectomy (09/06/2019); Anterior cervical decomp/discectomy fusion (N/A, 09/06/2019); and Flexible bronchoscopy (Left, 03/22/2022).  Social history:  He  reports that he has been smoking cigarettes. He has a 9.00 pack-year smoking history. He has never used smokeless tobacco. He reports that he does not currently use alcohol after a past usage of about 14.0 - 21.0 standard drinks of alcohol per week. He reports current drug use. Drugs: Marijuana and Cocaine.   Review of systems:  Reviewed and negative  Family history:  His family history includes Allergies in his father, mother, and son; Asthma in his mother; Cancer in his mother; Clotting disorder in his mother; Diabetes in his brother; Emphysema in his father and mother; Heart disease in his mother; Seizures in his brother.    Medications:   No current facility-administered medications on file prior to encounter.   Current Outpatient Medications on File Prior to Encounter  Medication Sig   acetaminophen (TYLENOL) 325 MG tablet Take 2 tablets (650 mg total) by mouth every 6 (six) hours as needed for mild pain (or Fever >/= 101).   albuterol (PROVENTIL) (2.5 MG/3ML) 0.083% nebulizer solution Take 3 mLs (2.5 mg total) by nebulization every 4 (four) hours as needed for wheezing or shortness of breath.   albuterol (VENTOLIN HFA) 108 (90 Base) MCG/ACT inhaler Inhale 2 puffs into the lungs every 6 (six) hours as needed for wheezing or shortness of breath.   apixaban (ELIQUIS) 5 MG TABS tablet Take 1 tablet (5 mg total) by mouth 2 (two) times daily.   bacitracin ointment Apply 1 Application topically 2 (two) times daily.    Budeson-Glycopyrrol-Formoterol (BREZTRI AEROSPHERE) 160-9-4.8 MCG/ACT AERO Inhale 2 puffs into the lungs in the morning and at bedtime.   dextromethorphan-guaiFENesin (MUCINEX DM) 30-600 MG 12hr tablet Take 1 tablet by mouth 2 (two) times daily.   dorzolamide (TRUSOPT) 2 % ophthalmic solution Place 1 drop into the left eye 2 (two) times daily.   furosemide (LASIX) 20 MG tablet Take 1 tablet (20 mg total) by mouth daily.   gabapentin (NEURONTIN) 100 MG capsule Take 1 capsule (100 mg total) by mouth 3 (three) times daily.   hydrOXYzine (ATARAX) 25 MG tablet Take 1 tablet (25 mg total) by mouth 3 (three) times daily as needed for anxiety.   latanoprost (XALATAN) 0.005 % ophthalmic solution Place 1 drop into both eyes at bedtime.   loxapine (LOXITANE) 10 MG capsule Take 1 capsule (10 mg total) by mouth at bedtime.   mirtazapine (REMERON) 30 MG tablet Take 1 tablet (30 mg total) by mouth at bedtime.   Multiple Vitamin (MULTIVITAMIN WITH MINERALS) TABS tablet Take 1 tablet by mouth daily.   pantoprazole (PROTONIX) 40 MG tablet Take 1 tablet (40 mg total) by mouth 2 (two) times daily.   polyethylene glycol (MIRALAX / GLYCOLAX) 17 g packet Take 17 g by mouth daily.   senna-docusate (SENOKOT-S) 8.6-50 MG tablet Take 2 tablets by mouth at bedtime. (Patient taking differently: Take 2 tablets by mouth at bedtime as needed for mild constipation.)   atorvastatin (LIPITOR) 40 MG tablet TAKE ONE TABLET BY MOUTH DAILY (Patient not taking: Reported on 06/02/2022)   benztropine (COGENTIN) 0.5 MG tablet Take 0.5 mg by mouth at bedtime. (Patient not taking: Reported on 06/02/2022)   diclofenac Sodium (VOLTAREN) 1 % GEL Apply 2 g topically 4 (four) times daily as needed (pain). (Patient not taking: Reported on 06/02/2022)   OXYGEN Inhale 4-5 L into the lungs as directed.   4-5 liters with sleep and exertion as needed for low oxygen   predniSONE (DELTASONE) 10 MG tablet Take 6 tablets (60 mg total) by mouth daily with breakfast.  And decrease by one tablet daily (Patient not taking: Reported on 06/02/2022)   simethicone (MYLICON) 80 MG chewable tablet Chew 1 tablet (80 mg total) by mouth 4 (four) times daily as needed for flatulence. (Patient not taking: Reported on 06/02/2022)       Abdulhamid Olgin, MD Pulmonary and Critical Care Medicine [facility] [phone]   After 7:00 pm call Elink  [phone]     
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
## 2022-06-11 ENCOUNTER — Inpatient Hospital Stay (HOSPITAL_COMMUNITY): Payer: Medicaid Other

## 2022-06-11 DIAGNOSIS — Z515 Encounter for palliative care: Secondary | ICD-10-CM | POA: Diagnosis not present

## 2022-06-11 DIAGNOSIS — Z7189 Other specified counseling: Secondary | ICD-10-CM | POA: Diagnosis not present

## 2022-06-11 DIAGNOSIS — G9340 Encephalopathy, unspecified: Secondary | ICD-10-CM | POA: Diagnosis not present

## 2022-06-11 MED ORDER — ALPRAZOLAM 0.5 MG PO TABS
0.5000 mg | ORAL_TABLET | Freq: Three times a day (TID) | ORAL | Status: DC
  Administered 2022-06-11 – 2022-06-12 (×3): 0.5 mg via ORAL
  Filled 2022-06-11 (×3): qty 1

## 2022-06-11 NOTE — Progress Notes (Signed)
RT talked with patient about attempting to wear BIPAP again tonight. Patient asked about what time he will be getting his next breathing treatment which is between 1-2 am. Patient states he "would try to wear the BIPAP then because he should be really to sleep, if he put it on to early he won't be able to sleep".

## 2022-06-11 NOTE — Progress Notes (Signed)
AP [employer] Manufacturing engineer [facility]) Hospital Liaison note:  This patient is currently enrolled in [facility] outpatient-based Palliative Care. Will continue to follow for disposition.  Please call with any outpatient palliative questions or concerns.  Thank you, Lorelee Market, LPN [facility] Liaison [phone]
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
## 2022-06-11 NOTE — TOC Progression Note (Signed)
Transition of Care [facility]) - Progression Note    Patient Details  Name: Tony Sullivan MRN: [id number] Date of Birth: 14-Dec-1960  Transition of Care [facility]) CM/SW Contact  Ihor Gully, LCSW Phone Number: 06/11/2022, 2:44 PM  Clinical Narrative:    [facility] to meet with patient about hospice in his home Wednesday morning.  Expected Discharge Plan: Home/Self Care Barriers to Discharge: Continued Medical Work up  Expected Discharge Plan and Services Expected Discharge Plan: Home/Self Care     Post Acute Care Choice: Resumption of Svcs/PTA Provider Living arrangements for the past 2 months: Apartment Expected Discharge Date: 06/07/22                                     Social Determinants of Health (SDOH) Interventions    Readmission Risk Interventions    06/02/2022   11:59 AM 05/06/2022   11:49 AM 05/01/2022    7:51 AM  Readmission Risk Prevention Plan  Transportation Screening Complete Complete Complete  Medication Review Press photographer) Complete Complete Complete  HRI or Home Care Consult Complete Complete Complete  SW Recovery Care/Counseling Consult Complete Complete Complete  Palliative Care Screening Not Applicable Not Applicable Not [place] Not Applicable Not Applicable Not Applicable

## 2022-06-11 NOTE — Progress Notes (Signed)
Palliative:   [name] is lying quietly in bed.  He is resting comfortably, but wakes easily when I call his name.  He greets me, making and mostly keeping eye contact.  He is alert and oriented, able to make his basic needs known.  There is no family present at bedside at this time.  We talk about his acute health concerns and disposition.  Recently, [name] has been working with Manufacturing engineer for palliative care.  We talk about the benefits of going home with hospice care for "treat the treatable" care.  [name] states that he would like to be able to go home with support, and would like to meet with [facility] resented to to talk about how they can care for him at home.  Conference with attending, bedside nursing staff, transition of care team related to patient condition, needs, goals of care, disposition.  Plan: Would like to talk to [facility] representative about going home with "treat the treatable" hospice care.  40 minutes Quinn Axe, NP Palliative medicine team Team phone [phone] Greater than 50% of this time was spent counseling and coordinating care related to the above assessment and plan.

## 2022-06-11 NOTE — Progress Notes (Signed)
Pt off BIPAP when RT went in room to do breathing treatment

## 2022-06-11 NOTE — Progress Notes (Signed)
OT Cancellation Note  Patient Details Name: Tony Sullivan MRN: [id number] DOB: Dec 25, 1960   Cancelled Treatment:    Reason Eval/Treat Not Completed: Medical issues which prohibited therapy. Pt has been moved to a higher level of care and will be removed from the OT list. New orders will be needed when appropriate if pt is to be evaluated.   Pranavi Aure OT, MOT   Larey Seat 06/11/2022, 8:01 AM

## 2022-06-12 DIAGNOSIS — F319 Bipolar disorder, unspecified: Secondary | ICD-10-CM | POA: Diagnosis not present

## 2022-06-12 DIAGNOSIS — I10 Essential (primary) hypertension: Secondary | ICD-10-CM

## 2022-06-12 DIAGNOSIS — Z515 Encounter for palliative care: Secondary | ICD-10-CM | POA: Diagnosis not present

## 2022-06-12 DIAGNOSIS — J441 Chronic obstructive pulmonary disease with (acute) exacerbation: Secondary | ICD-10-CM | POA: Diagnosis not present

## 2022-06-12 DIAGNOSIS — G9340 Encephalopathy, unspecified: Secondary | ICD-10-CM | POA: Diagnosis not present

## 2022-06-12 DIAGNOSIS — B377 Candidal sepsis: Secondary | ICD-10-CM | POA: Diagnosis not present

## 2022-06-12 MED ORDER — MORPHINE SULFATE 20 MG/5ML PO SOLN: 5.0000 mg | Freq: Four times a day (QID) | ORAL | 0 refills | Status: DC | PRN

## 2022-06-12 MED ORDER — PREDNISONE 10 MG PO TABS: ORAL_TABLET | ORAL | 0 refills | Status: DC

## 2022-06-12 MED ORDER — DM-GUAIFENESIN ER 30-600 MG PO TB12: 1.0000 | ORAL_TABLET | Freq: Two times a day (BID) | ORAL | 0 refills | Status: AC

## 2022-06-12 MED ORDER — ALPRAZOLAM 0.5 MG PO TABS: 0.5000 mg | ORAL_TABLET | Freq: Four times a day (QID) | ORAL | 0 refills | Status: DC | PRN

## 2022-06-12 MED ORDER — SILVER SULFADIAZINE 1 % EX CREA: TOPICAL_CREAM | Freq: Every day | CUTANEOUS | 0 refills | Status: AC

## 2022-06-12 MED ORDER — COLLAGENASE 250 UNIT/GM EX OINT: TOPICAL_OINTMENT | Freq: Every day | CUTANEOUS | 0 refills | Status: AC

## 2022-06-12 MED ORDER — MUPIROCIN 2 % EX OINT: 1.0000 | TOPICAL_OINTMENT | Freq: Every day | CUTANEOUS | 0 refills | Status: AC

## 2022-06-12 MED ORDER — FLUCONAZOLE 200 MG PO TABS: 400.0000 mg | ORAL_TABLET | Freq: Every day | ORAL | 0 refills | Status: DC

## 2022-06-12 NOTE — Plan of Care (Signed)
MD, Case Mgmt and Hospice have met with patient and he has agreed to go home with hospice.  At baseline on O2 and ADL's, has all equipment at home. 1700 appointment for hospice nurse to visit tomorrow.

## 2022-06-12 NOTE — Progress Notes (Signed)
Palliative: Mr. Harriman is agreeable to meeting with [employer] [facility]) representative about at home hospice care.  We talked about the benefits of hospice care for comfort, longevity.  Conference with attending, bedside nursing staff, transition of care team, [facility] hospice representative, related to patient condition, needs, goals of care, disposition home with hospice care.  Plan:  Treat the treatable but no CPR or intubation.  Home with the benefits of [facility] hospice care.  Will need strong symptom management to stay out of the hospital.  25 minutes Quinn Axe, NP Palliative medicine team Team phone [phone] Greater than 50% of this time was spent counseling and coordinating care related to the above assessment and plan.

## 2022-06-12 NOTE — Progress Notes (Signed)
Pt refused BIPAP.

## 2022-06-12 NOTE — Discharge Summary (Signed)
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 airspace disease, effusion, or pneumothorax. Stable left suprahilar prominence consistent with known endobronchial malignancy. No acute bony abnormality. IMPRESSION: 1. Stable left suprahilar prominence consistent with known left upper lobe endobronchial malignancy. Please refer to previous CT 05/15/2022. 2. Emphysema.  No acute airspace disease. Electronically Signed   By: Randa Ngo M.D.   On: 05/31/2022 18:37   DG Tibia/Fibula Left  Result Date: 05/31/2022 CLINICAL DATA:  Found down, possible sepsis EXAM: LEFT TIBIA AND FIBULA - 2 VIEW COMPARISON:  None Available. FINDINGS: There is no evidence of fracture or other focal bone lesions. Bandage material about the anterior and lateral lower leg. IMPRESSION: No fracture or dislocation of the left tibia or fibula. Bandage material about the anterior and lateral lower leg. Electronically Signed   By: Delanna Ahmadi M.D.   On: 05/31/2022 18:37   MR THORACIC SPINE WO CONTRAST  Result Date: 05/21/2022 CLINICAL DATA:  Initial evaluation for  soft tissue ulceration to back. EXAM: MRI THORACIC AND LUMBAR SPINE WITHOUT CONTRAST TECHNIQUE: Multiplanar and multiecho pulse sequences of the thoracic and lumbar spine were obtained without intravenous contrast. COMPARISON:  Prior MRI from 08/26/2019. FINDINGS: MRI THORACIC SPINE FINDINGS Alignment: Physiologic with preservation of the normal thoracic kyphosis. No significant listhesis. Vertebrae: Susceptibility artifact related  to prior ACDF noted at C5-6 on counter sequence. Underlying bone marrow signal intensity within normal limits. No worrisome osseous lesions. Multiple acute to subacute compression fractures are seen as follows; 1. T5 compression fracture with up to 50% height loss and trace 2 mm bony retropulsion. 2. T6 compression fracture with up to 35% height loss and trace 2 mm bony retropulsion. 3. T7 compression fracture with 35% height loss without bony retropulsion. 4. T8 compression fracture with up to 50% height loss and 3 mm bony retropulsion. 5. T9 compression fracture with 20% height loss without significant bony retropulsion. 6. T11 compression fracture with mild 35% central height loss without significant bony retropulsion. 7. T12 compression fracture with mild 15% height loss without significant bony retropulsion. Marrow edema involving the left transverse process of T6 likely reflects an acute nondisplaced fracture (a series 18, image 15). Additional chronic T10 compression fracture with mild 30% central height loss without retropulsion noted. No evidence for osteomyelitis discitis within the thoracic spine. Cord:  Normal signal and morphology. Paraspinal and other soft tissues: Mild diffuse edema noted within the subcutaneous fat of the posterior thoracolumbar spine. No loculated collections. Consolidative opacity at the visualized posterior left lung base, which could reflect atelectasis or infiltrate. Disc levels: No significant disc pathology seen within the thoracic spine for age.  Trace bony retropulsion about several of the compression fractures as above. No significant spinal stenosis. Foramina remain largely patent. MRI LUMBAR SPINE FINDINGS Segmentation: Transitional features seen about the lumbosacral junction with a partially lumbarized S1 segment. Alignment: Physiologic with preservation of the normal lumbar lordosis. No significant listhesis. Vertebrae: Bone marrow signal intensity within normal limits. No discrete or worrisome osseous lesions. Several acute to early subacute compression fractures are seen as follows; 1. Compression fracture involving the superior endplate of L2 with 61% central height loss with trace 2 mm bony retropulsion. 2. Compression fracture involving the L3 vertebral body with mild 10% height loss without significant bony retropulsion. 3. Acute to subacute on chronic compression fracture involving the L4 vertebral body with complete central height loss with trace 2 mm bony retropulsion. 4. Compression fracture involving the superior endplate of L5 with mild 20% height loss without bony retropulsion. 5. Compression fracture involving the superior endplate of S1 without significant height loss or bony retropulsion. Conus medullaris and cauda equina: Conus extends to the L1 level. Conus and cauda equina appear normal. Paraspinal and other soft tissues: Soft tissue defect overlying the spinous processes of L1 through L3, compatible with ulceration (series 2, image 6). Diffuse edema within the underlying subcutaneous fat and paraspinous musculature. No discrete or loculated collections. No convincing evidence for osteomyelitis discitis or septic arthritis. Visualized visceral structures within normal limits. Disc levels: L1-2: Negative interspace. Mild facet hypertrophy. No significant stenosis. L2-3: Mild disc bulge with trace bony retropulsion. Mild facet hypertrophy. No significant spinal stenosis. Foramina remain patent. L3-4: Disc bulge with disc desiccation.  Superimposed small left foraminal disc protrusion closely approximates the exiting left L3 nerve root (series 5, image 16). Mild facet hypertrophy. No significant spinal stenosis. Foramina remain patent. L4-5: Mild disc bulge with disc desiccation and trace bony retropulsion. Mild facet hypertrophy. No significant spinal stenosis. Moderate right greater than left L4 foraminal narrowing. L5-S1: Mild diffuse disc bulge. Moderate facet hypertrophy with associated small joint effusions. No significant spinal stenosis. Moderate right with mild-to-moderate left L5 foraminal narrowing. S1-2: Rudimentary S1-2 interspace. No disc bulge or focal disc herniation. No stenosis. IMPRESSION: 1. Multiple acute to subacute compression  fractures involving the T5, T6, T7, T8, T9, T11, T12, L2, L3, L4, L5, and S1 as detailed above. No more than trace bony retropulsion at several levels without significant spinal stenosis. 2. Soft tissue ulceration overlying the lower back at the level of the spinous processes of L1 through L3. Diffuse edema throughout the underlying posterior paraspinous soft tissues without loculated or drainable fluid collection. No convincing evidence for osteomyelitis discitis or septic arthritis within the thoracolumbar spine itself. 3. Mild multilevel lumbar spondylosis without significant spinal stenosis. Moderate bilateral L4 and L5 foraminal narrowing. 4. Patchy consolidative opacity within the posterior left lung base, either atelectasis or infiltrate. Electronically Signed   By: Jeannine Boga M.D.   On: 05/21/2022 20:06   MR LUMBAR SPINE WO CONTRAST  Result Date: 05/21/2022 CLINICAL DATA:  Initial evaluation for soft tissue ulceration to back. EXAM: MRI THORACIC AND LUMBAR SPINE WITHOUT CONTRAST TECHNIQUE: Multiplanar and multiecho pulse sequences of the thoracic and lumbar spine were obtained without intravenous contrast. COMPARISON:  Prior MRI from 08/26/2019. FINDINGS: MRI THORACIC SPINE  FINDINGS Alignment: Physiologic with preservation of the normal thoracic kyphosis. No significant listhesis. Vertebrae: Susceptibility artifact related to prior ACDF noted at C5-6 on counter sequence. Underlying bone marrow signal intensity within normal limits. No worrisome osseous lesions. Multiple acute to subacute compression fractures are seen as follows; 1. T5 compression fracture with up to 50% height loss and trace 2 mm bony retropulsion. 2. T6 compression fracture with up to 35% height loss and trace 2 mm bony retropulsion. 3. T7 compression fracture with 35% height loss without bony retropulsion. 4. T8 compression fracture with up to 50% height loss and 3 mm bony retropulsion. 5. T9 compression fracture with 20% height loss without significant bony retropulsion. 6. T11 compression fracture with mild 35% central height loss without significant bony retropulsion. 7. T12 compression fracture with mild 15% height loss without significant bony retropulsion. Marrow edema involving the left transverse process of T6 likely reflects an acute nondisplaced fracture (a series 18, image 15). Additional chronic T10 compression fracture with mild 30% central height loss without retropulsion noted. No evidence for osteomyelitis discitis within the thoracic spine. Cord:  Normal signal and morphology. Paraspinal and other soft tissues: Mild diffuse edema noted within the subcutaneous fat of the posterior thoracolumbar spine. No loculated collections. Consolidative opacity at the visualized posterior left lung base, which could reflect atelectasis or infiltrate. Disc levels: No significant disc pathology seen within the thoracic spine for age. Trace bony retropulsion about several of the compression fractures as above. No significant spinal stenosis. Foramina remain largely patent. MRI LUMBAR SPINE FINDINGS Segmentation: Transitional features seen about the lumbosacral junction with a partially lumbarized S1 segment.  Alignment: Physiologic with preservation of the normal lumbar lordosis. No significant listhesis. Vertebrae: Bone marrow signal intensity within normal limits. No discrete or worrisome osseous lesions. Several acute to early subacute compression fractures are seen as follows; 1. Compression fracture involving the superior endplate of L2 with 99% central height loss with trace 2 mm bony retropulsion. 2. Compression fracture involving the L3 vertebral body with mild 10% height loss without significant bony retropulsion. 3. Acute to subacute on chronic compression fracture involving the L4 vertebral body with complete central height loss with trace 2 mm bony retropulsion. 4. Compression fracture involving the superior endplate of L5 with mild 20% height loss without bony retropulsion. 5. Compression fracture involving the superior endplate of S1 without significant height loss or bony retropulsion. Conus medullaris and cauda equina: Conus  extends to the L1 level. Conus and cauda equina appear normal. Paraspinal and other soft tissues: Soft tissue defect overlying the spinous processes of L1 through L3, compatible with ulceration (series 2, image 6). Diffuse edema within the underlying subcutaneous fat and paraspinous musculature. No discrete or loculated collections. No convincing evidence for osteomyelitis discitis or septic arthritis. Visualized visceral structures within normal limits. Disc levels: L1-2: Negative interspace. Mild facet hypertrophy. No significant stenosis. L2-3: Mild disc bulge with trace bony retropulsion. Mild facet hypertrophy. No significant spinal stenosis. Foramina remain patent. L3-4: Disc bulge with disc desiccation. Superimposed small left foraminal disc protrusion closely approximates the exiting left L3 nerve root (series 5, image 16). Mild facet hypertrophy. No significant spinal stenosis. Foramina remain patent. L4-5: Mild disc bulge with disc desiccation and trace bony retropulsion.  Mild facet hypertrophy. No significant spinal stenosis. Moderate right greater than left L4 foraminal narrowing. L5-S1: Mild diffuse disc bulge. Moderate facet hypertrophy with associated small joint effusions. No significant spinal stenosis. Moderate right with mild-to-moderate left L5 foraminal narrowing. S1-2: Rudimentary S1-2 interspace. No disc bulge or focal disc herniation. No stenosis. IMPRESSION: 1. Multiple acute to subacute compression fractures involving the T5, T6, T7, T8, T9, T11, T12, L2, L3, L4, L5, and S1 as detailed above. No more than trace bony retropulsion at several levels without significant spinal stenosis. 2. Soft tissue ulceration overlying the lower back at the level of the spinous processes of L1 through L3. Diffuse edema throughout the underlying posterior paraspinous soft tissues without loculated or drainable fluid collection. No convincing evidence for osteomyelitis discitis or septic arthritis within the thoracolumbar spine itself. 3. Mild multilevel lumbar spondylosis without significant spinal stenosis. Moderate bilateral L4 and L5 foraminal narrowing. 4. Patchy consolidative opacity within the posterior left lung base, either atelectasis or infiltrate. Electronically Signed   By: Jeannine Boga M.D.   On: 05/21/2022 20:06   DG Chest Port 1 View  Result Date: 05/21/2022 CLINICAL DATA:  Shortness of breath.  Smoker. EXAM: PORTABLE CHEST 1 VIEW COMPARISON:  05/15/2022 FINDINGS: Normal sized heart. Clear lungs. The lungs remain mildly hyperexpanded. Previously described callus associated with the right posterior 6th rib, compatible with an old, healed fracture. IMPRESSION: No acute abnormality.  Stable mild changes of COPD. Electronically Signed   By: Claudie Revering M.D.   On: 05/21/2022 15:44   CT Angio Chest PE W and/or Wo Contrast  Result Date: 05/15/2022 CLINICAL DATA:  Hemoptysis since yesterday morning, non-small cell lung cancer EXAM: CT ANGIOGRAPHY CHEST WITH  CONTRAST TECHNIQUE: Multidetector CT imaging of the chest was performed using the standard protocol during bolus administration of intravenous contrast. Multiplanar CT image reconstructions and MIPs were obtained to evaluate the vascular anatomy. RADIATION DOSE REDUCTION: This exam was performed according to the departmental dose-optimization program which includes automated exposure control, adjustment of the mA and/or kV according to patient size and/or use of iterative reconstruction technique. CONTRAST:  36mL OMNIPAQUE IOHEXOL 350 MG/ML SOLN COMPARISON:  05/07/2022 FINDINGS: Cardiovascular: This is a technically adequate evaluation of the pulmonary vasculature. There are no filling defects or pulmonary emboli. The heart is unremarkable without pericardial effusion. No evidence of thoracic aortic aneurysm or dissection. Stable atherosclerosis of the aorta and coronary vasculature. Mediastinum/Nodes: No enlarged mediastinal, hilar, or axillary lymph nodes. Thyroid gland, trachea, and esophagus demonstrate no significant findings. Lungs/Pleura: No significant change in the large endobronchial lesion occupying the majority of the left upper lobe bronchial tree. Severe upper lobe predominant emphysema is again noted. No acute  airspace disease. Trace left pleural effusion. No pneumothorax. Upper Abdomen: Stable nodular right adrenal thickening measuring 2 x 1 cm, mean attenuation -3 HU, most consistent with adenoma. No acute upper abdominal findings. Musculoskeletal: No acute or destructive bony lesions. Multiple prior healed bilateral rib fractures. Chronic compression deformities at T5, T7, T8, T11, and T12. Reconstructed images demonstrate no additional findings. Review of the MIP images confirms the above findings. IMPRESSION: 1. No evidence of pulmonary embolus. 2. Stable left upper lobe endobronchial mass consistent with known lung cancer. 3. Trace left pleural effusion. 4. Aortic Atherosclerosis (ICD10-I70.0)  and Emphysema (ICD10-J43.9). 5. Stable right adrenal nodule, previously characterized as adenoma. Electronically Signed   By: Randa Ngo M.D.   On: 05/15/2022 18:04   CT Head Wo Contrast  Result Date: 05/15/2022 CLINICAL DATA:  Mental status change. EXAM: CT HEAD WITHOUT CONTRAST TECHNIQUE: Contiguous axial images were obtained from the base of the skull through the vertex without intravenous contrast. RADIATION DOSE REDUCTION: This exam was performed according to the departmental dose-optimization program which includes automated exposure control, adjustment of the mA and/or kV according to patient size and/or use of iterative reconstruction technique. COMPARISON:  None Available. FINDINGS: Brain: No evidence of acute infarction, hemorrhage, hydrocephalus, extra-axial collection or mass lesion/mass effect. Vascular: No hyperdense vessel or unexpected calcification. Skull: Normal. Negative for fracture or focal lesion. Sinuses/Orbits: Minimal polypoid thickening in the maxillary sinuses Other: None. IMPRESSION: No acute intracranial abnormality. Electronically Signed   By: Fidela Salisbury M.D.   On: 05/15/2022 18:02   DG Chest Portable 1 View  Result Date: 05/15/2022 CLINICAL DATA:  Hemoptysis.  Pain. EXAM: PORTABLE CHEST 1 VIEW COMPARISON:  05/07/2022 CT and 05/06/2022 chest x-ray FINDINGS: Heart size is normal. There is perihilar peribronchial thickening. No new consolidations or pleural effusions. No evidence for pulmonary edema. Remote RIGHT posterior 6th rib fracture. IMPRESSION: No evidence for acute abnormality. Chronic bronchitic changes. Electronically Signed   By: Nolon Nations M.D.   On: 05/15/2022 13:48    Microbiology: Results for orders placed or performed during the hospital encounter of 05/31/22  Resp Panel by RT-PCR (Flu A&B, Covid) Urine, Catheterized     Status: None   Collection Time: 05/31/22  5:31 PM   Specimen: Urine, Catheterized; Nasal Swab  Result Value Ref Range  Status   SARS Coronavirus 2 by RT PCR NEGATIVE NEGATIVE Final    Comment: (NOTE) SARS-CoV-2 target nucleic acids are NOT DETECTED.  The SARS-CoV-2 RNA is generally detectable in upper respiratory specimens during the acute phase of infection. The lowest concentration of SARS-CoV-2 viral copies this assay can detect is 138 copies/mL. A negative result does not preclude SARS-Cov-2 infection and should not be used as the sole basis for treatment or other patient management decisions. A negative result may occur with  improper specimen collection/handling, submission of specimen other than nasopharyngeal swab, presence of viral mutation(s) within the areas targeted by this assay, and inadequate number of viral copies(<138 copies/mL). A negative result must be combined with clinical observations, patient history, and epidemiological information. The expected result is Negative.  Fact Sheet for Patients:  EntrepreneurPulse.com.au  Fact Sheet for Healthcare Providers:  IncredibleEmployment.be  This test is no t yet approved or cleared by the [place] FDA and  has been authorized for detection and/or diagnosis of SARS-CoV-2 by FDA under an Emergency Use Authorization (EUA). This EUA will remain  in effect (meaning this test can be used) for the duration of the COVID-19 declaration under Section 564(b)(1)  of the Act, 21 U.S.C.section 360bbb-3(b)(1), unless the authorization is terminated  or revoked sooner.       Influenza A by PCR NEGATIVE NEGATIVE Final   Influenza B by PCR NEGATIVE NEGATIVE Final    Comment: (NOTE) The Xpert Xpress SARS-CoV-2/FLU/RSV plus assay is intended as an aid in the diagnosis of influenza from Nasopharyngeal swab specimens and should not be used as a sole basis for treatment. Nasal washings and aspirates are unacceptable for Xpert Xpress SARS-CoV-2/FLU/RSV testing.  Fact Sheet for  Patients: EntrepreneurPulse.com.au  Fact Sheet for Healthcare Providers: IncredibleEmployment.be  This test is not yet approved or cleared by the [place] FDA and has been authorized for detection and/or diagnosis of SARS-CoV-2 by FDA under an Emergency Use Authorization (EUA). This EUA will remain in effect (meaning this test can be used) for the duration of the COVID-19 declaration under Section 564(b)(1) of the Act, 21 U.S.C. section 360bbb-3(b)(1), unless the authorization is terminated or revoked.  Performed at [facility], [address], [place], [place] [address]   Urine Culture     Status: None   Collection Time: 05/31/22  5:31 PM   Specimen: In/Out Cath Urine  Result Value Ref Range Status   Specimen Description   Final    IN/OUT CATH URINE Performed at [facility], [address], [place], [place] [address]    Special Requests   Final    NONE Performed at [facility], [address], [place], [place] [address]    Culture   Final    NO GROWTH Performed at [facility], [place] [address] [place], [place] [address]    Report Status 06/02/2022 FINAL  Final  Blood Culture (routine x 2)     Status: Abnormal   Collection Time: 05/31/22  6:10 PM   Specimen: Left Antecubital; Blood  Result Value Ref Range Status   Specimen Description   Final    LEFT ANTECUBITAL Performed at [facility], [address], [place], [place] [address]    Special Requests   Final    BOTTLES DRAWN AEROBIC AND ANAEROBIC Blood Culture adequate volume Performed at [facility], [address] [place], [place] [address]    Culture  Setup Time   Final    YEAST WITH PSEUDOHYPHAE Gram Stain Report Called to,Read Back By and Verified With:  K. WATSON @ 4010 BY STEPHTR 06/02/2022 Organism ID to follow    Culture (A)  Final    CANDIDA ALBICANS SEE SEPARATE REPORT FOR SUSCEPTIBILITY RESULTS Performed at [facility], [place] [address]  [place], [place] [address]    Report Status 06/07/2022 FINAL  Final  Blood Culture ID Panel (Reflexed)     Status: Abnormal   Collection Time: 05/31/22  6:10 PM  Result Value Ref Range Status   Enterococcus faecalis NOT DETECTED NOT DETECTED Final   Enterococcus Faecium NOT DETECTED NOT DETECTED Final   Listeria monocytogenes NOT DETECTED NOT DETECTED Final   Staphylococcus species NOT DETECTED NOT DETECTED Final   Staphylococcus aureus (BCID) NOT DETECTED NOT DETECTED Final   Staphylococcus epidermidis NOT DETECTED NOT DETECTED Final   Staphylococcus lugdunensis NOT DETECTED NOT DETECTED Final   Streptococcus species NOT DETECTED NOT DETECTED Final   Streptococcus agalactiae NOT DETECTED NOT DETECTED Final   Streptococcus pneumoniae NOT DETECTED NOT DETECTED Final   Streptococcus pyogenes NOT DETECTED NOT DETECTED Final   A.calcoaceticus-baumannii NOT DETECTED NOT DETECTED Final   Bacteroides fragilis NOT DETECTED NOT DETECTED Final   Enterobacterales  NOT DETECTED NOT DETECTED Final   Enterobacter cloacae complex NOT DETECTED NOT DETECTED Final   Escherichia coli NOT DETECTED NOT DETECTED Final   Klebsiella aerogenes NOT DETECTED NOT DETECTED Final   Klebsiella oxytoca NOT DETECTED NOT DETECTED Final   Klebsiella pneumoniae NOT DETECTED NOT DETECTED Final   Proteus species NOT DETECTED NOT DETECTED Final   Salmonella species NOT DETECTED NOT DETECTED Final   Serratia marcescens NOT DETECTED NOT DETECTED Final   Haemophilus influenzae NOT DETECTED NOT DETECTED Final   Neisseria meningitidis NOT DETECTED NOT DETECTED Final   Pseudomonas aeruginosa NOT DETECTED NOT DETECTED Final   Stenotrophomonas maltophilia NOT DETECTED NOT DETECTED Final   Candida albicans DETECTED (A) NOT DETECTED Final    Comment: CRITICAL RESULT CALLED TO, READ BACK BY AND VERIFIED WITH: Arlington Calix RN, AT 1552 06/02/22 BY D. VANHOOK    Candida auris NOT DETECTED NOT DETECTED Final   Candida glabrata NOT DETECTED NOT  DETECTED Final   Candida krusei NOT DETECTED NOT DETECTED Final   Candida parapsilosis NOT DETECTED NOT DETECTED Final   Candida tropicalis NOT DETECTED NOT DETECTED Final   Cryptococcus neoformans/gattii NOT DETECTED NOT DETECTED Final    Comment: Performed at [facility], [address], [place], [place] [address]  Antifungal AST 9 Drug Panel     Status: None   Collection Time: 05/31/22  6:10 PM  Result Value Ref Range Status   Organism ID, Yeast Candida albicans  Corrected    Comment: (NOTE) Identification performed by account, not confirmed by this laboratory. CORRECTED ON 08/11 AT 1137: PREVIOUSLY REPORTED AS Preliminary report    Amphotericin B MIC 0.25 ug/mL  Final    Comment: (NOTE) Breakpoints have been established for only some organism-drug combinations as indicated. This test was developed and its performance characteristics determined by [facility]. It has not been cleared or approved by the Food and Drug Administration.    Anidulafungin MIC Comment  Final    Comment: (NOTE) 0.06 ug/mL Susceptible Breakpoints have been established for only some organism-drug combinations as indicated. This test was developed and its performance characteristics determined by [facility]. It has not been cleared or approved by the Food and Drug Administration.    Caspofungin MIC Comment  Final    Comment: (NOTE) 0.25 ug/mL Susceptible Breakpoints have been established for only some organism-drug combinations as indicated. This test was developed and its performance characteristics determined by [facility]. It has not been cleared or approved by the Food and Drug Administration.    Micafungin MIC Comment  Final    Comment: (NOTE) 0.016 ug/mL Susceptible Breakpoints have been established for only some organism-drug combinations as indicated. This test was developed and its performance characteristics determined by [facility]. It has not been cleared or approved by the Food and Drug  Administration.    Posaconazole MIC 0.12 ug/mL  Final    Comment: (NOTE) Breakpoints have been established for only some organism-drug combinations as indicated. This test was developed and its performance characteristics determined by [facility]. It has not been cleared or approved by the Food and Drug Administration.    Fluconazole Islt MIC 1.0 ug/mL Susceptible  Final    Comment: (NOTE) Breakpoints have been established for only some organism-drug combinations as indicated. This test was developed and its performance characteristics determined by [facility]. It has not been cleared or approved by the Food and Drug Administration.    Flucytosine MIC 0.12 ug/mL  Final    Comment: (NOTE) Breakpoints have been established  for only some organism-drug combinations as indicated. This test was developed and its performance characteristics determined by [facility]. It has not been cleared or approved by the Food and Drug Administration.    Itraconazole MIC 0.25 ug/mL  Final    Comment: (NOTE) Breakpoints have been established for only some organism-drug combinations as indicated. This test was developed and its performance characteristics determined by [facility]. It has not been cleared or approved by the Food and Drug Administration.    Voriconazole MIC Comment  Final    Comment: (NOTE) 0.06 ug/mL Susceptible Breakpoints have been established for only some organism-drug combinations as indicated. This test was developed and its performance characteristics determined by [facility]. It has not been cleared or approved by the Food and Drug Administration. Performed At: [facility] [place], [place] [id number] Rush Farmer MD QI:[id number]    Source C.ALBICAN BLOOD CULTURE  Final    Comment: Performed at [facility], [place] [address] [place], [place] [address]  Blood Culture (routine x 2)     Status: None   Collection Time: 05/31/22  6:15 PM   Specimen: BLOOD  RIGHT ARM  Result Value Ref Range Status   Specimen Description BLOOD RIGHT ARM  Final   Special Requests BACTERIAL CASTS Blood Culture adequate volume  Final   Culture   Final    NO GROWTH 5 DAYS Performed at [facility], [address] [place], [place] [address]    Report Status 06/05/2022 FINAL  Final  MRSA Next Gen by PCR, Nasal     Status: None   Collection Time: 05/31/22  7:00 PM   Specimen: Nasal Mucosa; Nasal Swab  Result Value Ref Range Status   MRSA by PCR Next Gen NOT DETECTED NOT DETECTED Final    Comment: (NOTE) The GeneXpert MRSA Assay (FDA approved for NASAL specimens only), is one component of a comprehensive MRSA colonization surveillance program. It is not intended to diagnose MRSA infection nor to guide or monitor treatment for MRSA infections. Test performance is not FDA approved in patients less than 64 years old. Performed at [facility], [address], [place], [place] [address]   Culture, blood (Routine X 2) w Reflex to ID Panel     Status: None (Preliminary result)   Collection Time: 06/04/22  4:46 AM   Specimen: Blood  Result Value Ref Range Status   Specimen Description PENDING  Incomplete   Special Requests   Final    AEROBIC BOTTLE ONLY BLOOD LEFT HAND Blood Culture adequate volume   Culture   Final    NO GROWTH 5 DAYS Performed at [facility], [address], [place], [place] [address]    Report Status PENDING  Incomplete  Culture, blood (Routine X 2) w Reflex to ID Panel     Status: None (Preliminary result)   Collection Time: 06/04/22  4:46 AM   Specimen: Blood  Result Value Ref Range Status   Specimen Description PENDING  Incomplete   Special Requests   Final    AEROBIC BOTTLE ONLY BLOOD RIGHT HAND Blood Culture adequate volume   Culture   Final    NO GROWTH 5 DAYS Performed at [facility], [address], [place], [place] [address]    Report Status PENDING  Incomplete    Labs: CBC: Recent Labs  Lab 06/06/22 0438 06/07/22 0414  06/09/22 0334 06/10/22 0352  WBC 8.8 9.6 11.7* 12.9*  HGB 7.8* 9.1* 7.4* 9.0*  HCT 25.6* 30.7* 24.3* 28.8*  MCV 101.2* 103.4* 100.4* 98.3  PLT 170 188 210 194   Basic Metabolic Panel: Recent Labs  Lab 06/06/22 0438 06/09/22 0334  NA 138 139  K 3.7 3.7  CL 98 99  CO2 34* 31  GLUCOSE 110* 162*  BUN 13 18  CREATININE 0.60* 0.58*  CALCIUM 8.0* 8.2*  PHOS  --  1.8*   Liver Function Tests: Recent Labs  Lab 06/09/22 0334  ALBUMIN 1.8*   CBG: Recent Labs  Lab 06/05/22 1614  GLUCAP 141*    Discharge time spent: greater than 30 minutes.  Signed: Barton Dubois, MD [facility] 06/12/2022

## 2022-06-12 NOTE — Plan of Care (Signed)
  Problem: Education: Goal: Knowledge of General Education information will improve Description: Including pain rating scale, medication(s)/side effects and non-pharmacologic comfort measures 06/12/2022 1648 by Delila Pereyra, RN Outcome: Adequate for Discharge 06/12/2022 1149 by Delila Pereyra, RN Outcome: Adequate for Discharge   Problem: Health Behavior/Discharge Planning: Goal: Ability to manage health-related needs will improve 06/12/2022 1648 by Delila Pereyra, RN Outcome: Adequate for Discharge 06/12/2022 1149 by Delila Pereyra, RN Outcome: Adequate for Discharge   Problem: Clinical Measurements: Goal: Ability to maintain clinical measurements within normal limits will improve 06/12/2022 1648 by Delila Pereyra, RN Outcome: Adequate for Discharge 06/12/2022 1149 by Delila Pereyra, RN Outcome: Adequate for Discharge Goal: Will remain free from infection 06/12/2022 1648 by Delila Pereyra, RN Outcome: Adequate for Discharge 06/12/2022 1149 by Delila Pereyra, RN Outcome: Adequate for Discharge Goal: Diagnostic test results will improve 06/12/2022 1648 by Delila Pereyra, RN Outcome: Adequate for Discharge 06/12/2022 1149 by Delila Pereyra, RN Outcome: Adequate for Discharge Goal: Respiratory complications will improve 06/12/2022 1648 by Delila Pereyra, RN Outcome: Adequate for Discharge 06/12/2022 1149 by Delila Pereyra, RN Outcome: Adequate for Discharge Goal: Cardiovascular complication will be avoided 06/12/2022 1648 by Delila Pereyra, RN Outcome: Adequate for Discharge 06/12/2022 1149 by Delila Pereyra, RN Outcome: Adequate for Discharge   Problem: Activity: Goal: Risk for activity intolerance will decrease 06/12/2022 1648 by Delila Pereyra, RN Outcome: Adequate for Discharge 06/12/2022 1149 by Delila Pereyra, RN Outcome: Adequate for Discharge   Problem: Nutrition: Goal: Adequate nutrition will be maintained 06/12/2022 1648 by Delila Pereyra, RN Outcome:  Adequate for Discharge 06/12/2022 1149 by Delila Pereyra, RN Outcome: Adequate for Discharge   Problem: Coping: Goal: Level of anxiety will decrease 06/12/2022 1648 by Delila Pereyra, RN Outcome: Adequate for Discharge 06/12/2022 1149 by Delila Pereyra, RN Outcome: Adequate for Discharge   Problem: Elimination: Goal: Will not experience complications related to bowel motility 06/12/2022 1648 by Delila Pereyra, RN Outcome: Adequate for Discharge 06/12/2022 1149 by Delila Pereyra, RN Outcome: Adequate for Discharge Goal: Will not experience complications related to urinary retention 06/12/2022 1648 by Delila Pereyra, RN Outcome: Adequate for Discharge 06/12/2022 1149 by Delila Pereyra, RN Outcome: Adequate for Discharge   Problem: Pain Managment: Goal: General experience of comfort will improve 06/12/2022 1648 by Delila Pereyra, RN Outcome: Adequate for Discharge 06/12/2022 1149 by Delila Pereyra, RN Outcome: Adequate for Discharge   Problem: Safety: Goal: Ability to remain free from injury will improve 06/12/2022 1648 by Delila Pereyra, RN Outcome: Adequate for Discharge 06/12/2022 1149 by Delila Pereyra, RN Outcome: Adequate for Discharge   Problem: Skin Integrity: Goal: Risk for impaired skin integrity will decrease 06/12/2022 1648 by Delila Pereyra, RN Outcome: Adequate for Discharge 06/12/2022 1149 by Delila Pereyra, RN Outcome: Adequate for Discharge

## 2022-06-12 NOTE — Progress Notes (Signed)
Patient discharged to home by [facility]. Per patient's daughter, there will be people at his house to let the rescue squad get him inside. Patient requesting rescue inhaler at time of discharge, patient educated to request any extra medication needs from [facility] hospice. Patient stated he had all equipment and medication needs prior to discharge while going over discharge medications and instructions with him. DNR sent home with patient as well as discharge packet, medication prescription, and packet of information from [facility].

## 2022-06-12 NOTE — TOC Transition Note (Signed)
Transition of Care [facility]) - CM/SW Discharge Note   Patient Details  Name: Tony Sullivan MRN: [id number] Date of Birth: 09-17-1961  Transition of Care [facility]) CM/SW Contact:  Ihor Gully, LCSW Phone Number: 06/12/2022, 12:03 PM   Clinical Narrative:    Patient will go home with [facility] services. The nurse will visit him tomorrow at 9. He does not need any equipment. TOC signing off.    Final next level of care: Home w Hospice Care Barriers to Discharge: No Barriers Identified   Patient Goals and CMS Choice Patient states their goals for this hospitalization and ongoing recovery are:: return home      Discharge Placement                       Discharge Plan and Services     Post Acute Care Choice: Resumption of Svcs/PTA Provider                               Social Determinants of Health (SDOH) Interventions     Readmission Risk Interventions    06/02/2022   11:59 AM 05/06/2022   11:49 AM 05/01/2022    7:51 AM  Readmission Risk Prevention Plan  Transportation Screening Complete Complete Complete  Medication Review Press photographer) Complete Complete Complete  HRI or Home Care Consult Complete Complete Complete  SW Recovery Care/Counseling Consult Complete Complete Complete  Palliative Care Screening Not Applicable Not Applicable Not [place] Not Applicable Not Applicable Not Applicable

## 2022-06-12 NOTE — Plan of Care (Signed)

## 2022-06-12 NOTE — Progress Notes (Signed)
Patient seen by hospitalist, pulmonologist, hospice nurse and case mgmt/SW. Ready for DC dressing changes done and measured. EMS called and belongings gathered ready to go.

## 2022-06-12 NOTE — Progress Notes (Signed)
Called daughter Reuel Derby to let her know EMS is on the way and she states Roselyn Reef and Miss Vaughan Basta are waiting for him at his house, she will not be there but they will and are aware.

## 2022-06-12 NOTE — Progress Notes (Signed)
[facility]) Hospital Liaison RN note  Received request from [facility] for hospice services at home after discharge. Chart and patient information under review by Hospice physician.  Spoke with patient in room to initiate education related to hospice philosophy, services, and team approach to care. Patient/family verbalized understanding of information given.   Per discussion, the plan is for discharge home by EMS likely later today.  DME needs discussed. Patient has the following equipment in the home. Hospital bed, wheelchair, motorized scooter and oxygen. Patient has no other immediate DME needs.   Please send signed and completed DNR with patient/family. Please provide symptoms at discharge as needed for ongoing symptom management.   [facility] information and contact numbers given to patient. Above information shared with [facility].   Please call with any hospice related questions or concerns.  Thank you for the opportunity to participate in this patient's care.  Jhonnie Garner, Therapist, sports, BSN [employer] [phone]

## 2022-06-13 ENCOUNTER — Telehealth: Payer: Self-pay | Admitting: Oncology

## 2022-06-13 ENCOUNTER — Ambulatory Visit
Admit: 2022-06-13 | Discharge: 2022-06-13 | Disposition: A | Discharge status: Home / Self Care | Payer: Medicaid Other | Attending: Radiation Oncology | Admitting: Radiation Oncology

## 2022-06-13 ENCOUNTER — Ambulatory Visit: Payer: Medicaid Other

## 2022-06-13 LAB — CULTURE, BLOOD (ROUTINE X 2)
Culture: NO GROWTH
Culture: NO GROWTH
Special Requests: ADEQUATE
Special Requests: ADEQUATE

## 2022-06-13 NOTE — Telephone Encounter (Signed)
Called Tony Sullivan (daughter) regarding appointment today with Dr. Sondra Come.  She said they were told that Tony Sullivan is not strong enough for chemo or radiation and hospice is going to see him this afternoon.  She wants to cancel today's appointment.

## 2022-06-17 ENCOUNTER — Inpatient Hospital Stay (HOSPITAL_COMMUNITY)
Admission: EM | Admit: 2022-06-17 | Discharge: 2022-06-19 | DRG: 193 | Disposition: A | Discharge status: Hospice | Attending: Internal Medicine | Admitting: Internal Medicine

## 2022-06-17 ENCOUNTER — Encounter (HOSPITAL_COMMUNITY): Payer: Self-pay | Admitting: Emergency Medicine

## 2022-06-17 ENCOUNTER — Emergency Department (HOSPITAL_COMMUNITY)

## 2022-06-17 ENCOUNTER — Other Ambulatory Visit: Payer: Self-pay

## 2022-06-17 DIAGNOSIS — Z66 Do not resuscitate: Secondary | ICD-10-CM | POA: Diagnosis present

## 2022-06-17 DIAGNOSIS — C3492 Malignant neoplasm of unspecified part of left bronchus or lung: Secondary | ICD-10-CM | POA: Diagnosis not present

## 2022-06-17 DIAGNOSIS — J189 Pneumonia, unspecified organism: Secondary | ICD-10-CM | POA: Diagnosis present

## 2022-06-17 DIAGNOSIS — J449 Chronic obstructive pulmonary disease, unspecified: Secondary | ICD-10-CM | POA: Diagnosis present

## 2022-06-17 DIAGNOSIS — F32A Depression, unspecified: Secondary | ICD-10-CM | POA: Diagnosis present

## 2022-06-17 DIAGNOSIS — Z833 Family history of diabetes mellitus: Secondary | ICD-10-CM

## 2022-06-17 DIAGNOSIS — Z8249 Family history of ischemic heart disease and other diseases of the circulatory system: Secondary | ICD-10-CM | POA: Diagnosis not present

## 2022-06-17 DIAGNOSIS — Z7901 Long term (current) use of anticoagulants: Secondary | ICD-10-CM

## 2022-06-17 DIAGNOSIS — E876 Hypokalemia: Secondary | ICD-10-CM | POA: Diagnosis present

## 2022-06-17 DIAGNOSIS — J441 Chronic obstructive pulmonary disease with (acute) exacerbation: Secondary | ICD-10-CM | POA: Diagnosis not present

## 2022-06-17 DIAGNOSIS — B377 Candidal sepsis: Secondary | ICD-10-CM | POA: Diagnosis present

## 2022-06-17 DIAGNOSIS — J9621 Acute and chronic respiratory failure with hypoxia: Secondary | ICD-10-CM | POA: Diagnosis present

## 2022-06-17 DIAGNOSIS — Z20822 Contact with and (suspected) exposure to covid-19: Secondary | ICD-10-CM | POA: Diagnosis present

## 2022-06-17 DIAGNOSIS — J440 Chronic obstructive pulmonary disease with acute lower respiratory infection: Secondary | ICD-10-CM | POA: Diagnosis present

## 2022-06-17 DIAGNOSIS — Z7189 Other specified counseling: Secondary | ICD-10-CM | POA: Diagnosis not present

## 2022-06-17 DIAGNOSIS — Z880 Allergy status to penicillin: Secondary | ICD-10-CM

## 2022-06-17 DIAGNOSIS — F1910 Other psychoactive substance abuse, uncomplicated: Secondary | ICD-10-CM | POA: Diagnosis present

## 2022-06-17 DIAGNOSIS — C3490 Malignant neoplasm of unspecified part of unspecified bronchus or lung: Secondary | ICD-10-CM | POA: Diagnosis present

## 2022-06-17 DIAGNOSIS — K219 Gastro-esophageal reflux disease without esophagitis: Secondary | ICD-10-CM | POA: Diagnosis present

## 2022-06-17 DIAGNOSIS — R6520 Severe sepsis without septic shock: Secondary | ICD-10-CM | POA: Diagnosis not present

## 2022-06-17 DIAGNOSIS — F419 Anxiety disorder, unspecified: Secondary | ICD-10-CM | POA: Diagnosis present

## 2022-06-17 DIAGNOSIS — B379 Candidiasis, unspecified: Secondary | ICD-10-CM | POA: Diagnosis present

## 2022-06-17 DIAGNOSIS — Z888 Allergy status to other drugs, medicaments and biological substances status: Secondary | ICD-10-CM | POA: Diagnosis not present

## 2022-06-17 DIAGNOSIS — Z515 Encounter for palliative care: Secondary | ICD-10-CM

## 2022-06-17 DIAGNOSIS — R7303 Prediabetes: Secondary | ICD-10-CM | POA: Diagnosis present

## 2022-06-17 DIAGNOSIS — A419 Sepsis, unspecified organism: Secondary | ICD-10-CM | POA: Diagnosis present

## 2022-06-17 DIAGNOSIS — Z825 Family history of asthma and other chronic lower respiratory diseases: Secondary | ICD-10-CM | POA: Diagnosis not present

## 2022-06-17 DIAGNOSIS — Z86711 Personal history of pulmonary embolism: Secondary | ICD-10-CM

## 2022-06-17 DIAGNOSIS — Z832 Family history of diseases of the blood and blood-forming organs and certain disorders involving the immune mechanism: Secondary | ICD-10-CM | POA: Diagnosis not present

## 2022-06-17 DIAGNOSIS — Z79899 Other long term (current) drug therapy: Secondary | ICD-10-CM

## 2022-06-17 DIAGNOSIS — R54 Age-related physical debility: Secondary | ICD-10-CM | POA: Diagnosis present

## 2022-06-17 DIAGNOSIS — J9600 Acute respiratory failure, unspecified whether with hypoxia or hypercapnia: Secondary | ICD-10-CM | POA: Diagnosis not present

## 2022-06-17 DIAGNOSIS — F17210 Nicotine dependence, cigarettes, uncomplicated: Secondary | ICD-10-CM | POA: Diagnosis present

## 2022-06-17 DIAGNOSIS — I10 Essential (primary) hypertension: Secondary | ICD-10-CM | POA: Diagnosis present

## 2022-06-17 DIAGNOSIS — Z6820 Body mass index (BMI) 20.0-20.9, adult: Secondary | ICD-10-CM

## 2022-06-17 DIAGNOSIS — G893 Neoplasm related pain (acute) (chronic): Secondary | ICD-10-CM | POA: Diagnosis present

## 2022-06-17 LAB — URINALYSIS, ROUTINE W REFLEX MICROSCOPIC
Bilirubin Urine: NEGATIVE
Glucose, UA: NEGATIVE mg/dL
Hgb urine dipstick: NEGATIVE
Ketones, ur: NEGATIVE mg/dL
Leukocytes,Ua: NEGATIVE
Nitrite: NEGATIVE
Protein, ur: 30 mg/dL — AB
Specific Gravity, Urine: 1.017 (ref 1.005–1.030)
pH: 5 (ref 5.0–8.0)

## 2022-06-17 LAB — COMPREHENSIVE METABOLIC PANEL
ALT: 45 U/L — ABNORMAL HIGH (ref 0–44)
AST: 42 U/L — ABNORMAL HIGH (ref 15–41)
Albumin: 1.7 g/dL — ABNORMAL LOW (ref 3.5–5.0)
Alkaline Phosphatase: 105 U/L (ref 38–126)
Anion gap: 6 (ref 5–15)
BUN: 21 mg/dL — ABNORMAL HIGH (ref 6–20)
CO2: 34 mmol/L — ABNORMAL HIGH (ref 22–32)
Calcium: 8.1 mg/dL — ABNORMAL LOW (ref 8.9–10.3)
Chloride: 99 mmol/L (ref 98–111)
Creatinine, Ser: 0.83 mg/dL (ref 0.61–1.24)
GFR, Estimated: >60 mL/min (ref >60)
Glucose, Bld: 98 mg/dL (ref 70–99)
Potassium: 3.4 mmol/L — ABNORMAL LOW (ref 3.5–5.1)
Sodium: 139 mmol/L (ref 135–145)
Total Bilirubin: 0.2 mg/dL — ABNORMAL LOW (ref 0.3–1.2)
Total Protein: 4.9 g/dL — ABNORMAL LOW (ref 6.5–8.1)

## 2022-06-17 LAB — CBC WITH DIFFERENTIAL/PLATELET
Abs Immature Granulocytes: 0.21 10*3/uL — ABNORMAL HIGH (ref 0.00–0.07)
Basophils Absolute: 0 10*3/uL (ref 0.0–0.1)
Basophils Relative: 0 %
Eosinophils Absolute: 0 10*3/uL (ref 0.0–0.5)
Eosinophils Relative: 0 %
HCT: 30.8 % — ABNORMAL LOW (ref 39.0–52.0)
Hemoglobin: 9.2 g/dL — ABNORMAL LOW (ref 13.0–17.0)
Immature Granulocytes: 3 %
Lymphocytes Relative: 12 %
Lymphs Abs: 0.8 10*3/uL (ref 0.7–4.0)
MCH: 30.4 pg (ref 26.0–34.0)
MCHC: 29.9 g/dL — ABNORMAL LOW (ref 30.0–36.0)
MCV: 101.7 fL — ABNORMAL HIGH (ref 80.0–100.0)
Monocytes Absolute: 0.3 10*3/uL (ref 0.1–1.0)
Monocytes Relative: 4 %
Neutro Abs: 5.4 10*3/uL (ref 1.7–7.7)
Neutrophils Relative %: 81 %
Platelets: 165 10*3/uL (ref 150–400)
RBC: 3.03 MIL/uL — ABNORMAL LOW (ref 4.22–5.81)
RDW: 17.2 % — ABNORMAL HIGH (ref 11.5–15.5)
WBC: 6.7 10*3/uL (ref 4.0–10.5)
nRBC: 0 % (ref 0.0–0.2)

## 2022-06-17 LAB — FUNGUS CULTURE, BLOOD: Special Requests: ADEQUATE

## 2022-06-17 LAB — RAPID URINE DRUG SCREEN, HOSP PERFORMED
Amphetamines: NOT DETECTED
Barbiturates: NOT DETECTED
Benzodiazepines: NOT DETECTED
Cocaine: POSITIVE — AB
Opiates: NOT DETECTED
Tetrahydrocannabinol: NOT DETECTED

## 2022-06-17 LAB — BLOOD GAS, VENOUS
Acid-Base Excess: 12 mmol/L — ABNORMAL HIGH (ref 0.0–2.0)
Bicarbonate: 40.1 mmol/L — ABNORMAL HIGH (ref 20.0–28.0)
Drawn by: 1356
FIO2: 21 %
O2 Saturation: 47.1 %
Patient temperature: 38.2
pCO2, Ven: 80 mmHg (ref 44–60)
pH, Ven: 7.31 (ref 7.25–7.43)
pO2, Ven: 36 mmHg (ref 32–45)

## 2022-06-17 LAB — ETHANOL: Alcohol, Ethyl (B): <10 mg/dL (ref <10)

## 2022-06-17 LAB — SARS CORONAVIRUS 2 BY RT PCR: SARS Coronavirus 2 by RT PCR: NEGATIVE

## 2022-06-17 LAB — LACTIC ACID, PLASMA
Lactic Acid, Venous: 1 mmol/L (ref 0.5–1.9)
Lactic Acid, Venous: 1 mmol/L (ref 0.5–1.9)

## 2022-06-17 MED ORDER — SODIUM CHLORIDE 0.9 % IV SOLN: 2.0000 g | Freq: Three times a day (TID) | INTRAVENOUS | Status: DC

## 2022-06-17 MED ORDER — SODIUM CHLORIDE 0.9 % IV SOLN
2.0000 g | Freq: Three times a day (TID) | INTRAVENOUS | Status: DC
  Administered 2022-06-18 – 2022-06-19 (×4): 2 g via INTRAVENOUS
  Filled 2022-06-17 (×4): qty 12.5

## 2022-06-17 MED ORDER — BUDESON-GLYCOPYRROL-FORMOTEROL 160-9-4.8 MCG/ACT IN AERO: 2.0000 | INHALATION_SPRAY | Freq: Two times a day (BID) | RESPIRATORY_TRACT | Status: DC

## 2022-06-17 MED ORDER — HYDROXYZINE HCL 25 MG PO TABS
25.0000 mg | ORAL_TABLET | Freq: Three times a day (TID) | ORAL | Status: DC | PRN
Start: 2022-06-17 — End: 2022-06-19
  Administered 2022-06-18: 25 mg via ORAL
  Filled 2022-06-17: qty 1

## 2022-06-17 MED ORDER — LACTATED RINGERS IV SOLN: INTRAVENOUS | Status: AC

## 2022-06-17 MED ORDER — ENSURE ENLIVE PO LIQD
237.0000 mL | Freq: Two times a day (BID) | ORAL | Status: DC
  Filled 2022-06-17 (×2): qty 237

## 2022-06-17 MED ORDER — UMECLIDINIUM BROMIDE 62.5 MCG/ACT IN AEPB
1.0000 | INHALATION_SPRAY | Freq: Every day | RESPIRATORY_TRACT | Status: DC
  Administered 2022-06-18 – 2022-06-19 (×2): 1 via RESPIRATORY_TRACT
  Filled 2022-06-17: qty 7

## 2022-06-17 MED ORDER — ALPRAZOLAM 0.5 MG PO TABS
0.5000 mg | ORAL_TABLET | Freq: Four times a day (QID) | ORAL | Status: DC | PRN
Start: 2022-06-17 — End: 2022-06-19
  Administered 2022-06-18 – 2022-06-19 (×3): 0.5 mg via ORAL
  Filled 2022-06-17 (×3): qty 1

## 2022-06-17 MED ORDER — VANCOMYCIN HCL 1250 MG/250ML IV SOLN
1250.0000 mg | Freq: Once | INTRAVENOUS | Status: AC
  Administered 2022-06-17: 1250 mg via INTRAVENOUS
  Filled 2022-06-17: qty 250

## 2022-06-17 MED ORDER — PANTOPRAZOLE SODIUM 40 MG PO TBEC
40.0000 mg | DELAYED_RELEASE_TABLET | Freq: Two times a day (BID) | ORAL | Status: DC
  Administered 2022-06-18 – 2022-06-19 (×3): 40 mg via ORAL
  Filled 2022-06-17 (×3): qty 1

## 2022-06-17 MED ORDER — DM-GUAIFENESIN ER 30-600 MG PO TB12
1.0000 | ORAL_TABLET | Freq: Two times a day (BID) | ORAL | Status: DC
  Administered 2022-06-18 – 2022-06-19 (×3): 1 via ORAL
  Filled 2022-06-17 (×3): qty 1

## 2022-06-17 MED ORDER — ALBUTEROL SULFATE (2.5 MG/3ML) 0.083% IN NEBU
2.5000 mg | INHALATION_SOLUTION | RESPIRATORY_TRACT | Status: DC | PRN
Start: 2022-06-17 — End: 2022-06-19

## 2022-06-17 MED ORDER — SODIUM CHLORIDE 0.9 % IV SOLN
2.0000 g | Freq: Once | INTRAVENOUS | Status: DC
  Filled 2022-06-17: qty 10

## 2022-06-17 MED ORDER — ACETAMINOPHEN 325 MG PO TABS: 650.0000 mg | ORAL_TABLET | Freq: Four times a day (QID) | ORAL | Status: DC | PRN

## 2022-06-17 MED ORDER — ONDANSETRON HCL 4 MG PO TABS: 4.0000 mg | ORAL_TABLET | Freq: Four times a day (QID) | ORAL | Status: DC | PRN

## 2022-06-17 MED ORDER — FLUCONAZOLE IN SODIUM CHLORIDE 400-0.9 MG/200ML-% IV SOLN
400.0000 mg | INTRAVENOUS | Status: DC
Start: 2022-06-17 — End: 2022-06-18
  Filled 2022-06-17: qty 200

## 2022-06-17 MED ORDER — VANCOMYCIN HCL IN DEXTROSE 1-5 GM/200ML-% IV SOLN: 1000.0000 mg | Freq: Once | INTRAVENOUS | Status: DC

## 2022-06-17 MED ORDER — OXYCODONE HCL 5 MG PO TABS
5.0000 mg | ORAL_TABLET | ORAL | Status: DC | PRN
  Administered 2022-06-18 – 2022-06-19 (×4): 5 mg via ORAL
  Filled 2022-06-17 (×4): qty 1

## 2022-06-17 MED ORDER — HYDROMORPHONE HCL 1 MG/ML IJ SOLN
1.0000 mg | Freq: Once | INTRAMUSCULAR | Status: AC
  Administered 2022-06-17: 1 mg via INTRAVENOUS
  Filled 2022-06-17: qty 1

## 2022-06-17 MED ORDER — ACETAMINOPHEN 500 MG PO TABS
1000.0000 mg | ORAL_TABLET | Freq: Once | ORAL | Status: AC
  Administered 2022-06-17: 1000 mg via ORAL
  Filled 2022-06-17: qty 2

## 2022-06-17 MED ORDER — METRONIDAZOLE 500 MG/100ML IV SOLN
500.0000 mg | Freq: Once | INTRAVENOUS | Status: AC
  Administered 2022-06-17: 500 mg via INTRAVENOUS
  Filled 2022-06-17: qty 100

## 2022-06-17 MED ORDER — SODIUM CHLORIDE 0.9 % IV SOLN
2.0000 g | Freq: Once | INTRAVENOUS | Status: AC
  Administered 2022-06-17: 2 g via INTRAVENOUS
  Filled 2022-06-17: qty 12.5

## 2022-06-17 MED ORDER — ACETAMINOPHEN 650 MG RE SUPP: 650.0000 mg | Freq: Four times a day (QID) | RECTAL | Status: DC | PRN

## 2022-06-17 MED ORDER — MOMETASONE FURO-FORMOTEROL FUM 100-5 MCG/ACT IN AERO
2.0000 | INHALATION_SPRAY | Freq: Two times a day (BID) | RESPIRATORY_TRACT | Status: DC
  Administered 2022-06-18 – 2022-06-19 (×3): 2 via RESPIRATORY_TRACT
  Filled 2022-06-17: qty 8.8

## 2022-06-17 MED ORDER — ONDANSETRON HCL 4 MG/2ML IJ SOLN: 4.0000 mg | Freq: Four times a day (QID) | INTRAMUSCULAR | Status: DC | PRN

## 2022-06-17 MED ORDER — METRONIDAZOLE 500 MG/100ML IV SOLN
500.0000 mg | Freq: Two times a day (BID) | INTRAVENOUS | Status: DC
  Administered 2022-06-18: 500 mg via INTRAVENOUS
  Filled 2022-06-17: qty 100

## 2022-06-17 MED ORDER — SILVER SULFADIAZINE 1 % EX CREA
TOPICAL_CREAM | Freq: Every day | CUTANEOUS | Status: DC
  Filled 2022-06-17: qty 85

## 2022-06-17 MED ORDER — VANCOMYCIN HCL IN DEXTROSE 1-5 GM/200ML-% IV SOLN
1000.0000 mg | Freq: Two times a day (BID) | INTRAVENOUS | Status: DC
  Administered 2022-06-18: 1000 mg via INTRAVENOUS
  Filled 2022-06-17: qty 200

## 2022-06-17 MED ORDER — APIXABAN 5 MG PO TABS
5.0000 mg | ORAL_TABLET | Freq: Two times a day (BID) | ORAL | Status: DC
  Administered 2022-06-18 – 2022-06-19 (×3): 5 mg via ORAL
  Filled 2022-06-17 (×3): qty 1

## 2022-06-17 MED ORDER — MEDIHONEY WOUND/BURN DRESSING EX PSTE
1.0000 | PASTE | Freq: Every day | CUTANEOUS | Status: DC
  Administered 2022-06-18 – 2022-06-19 (×2): 1 via TOPICAL
  Filled 2022-06-17 (×2): qty 44

## 2022-06-17 MED ORDER — LACTATED RINGERS IV BOLUS
1000.0000 mL | Freq: Once | INTRAVENOUS | Status: AC
  Administered 2022-06-17: 1000 mL via INTRAVENOUS

## 2022-06-17 NOTE — Progress Notes (Signed)
Pharmacy Antibiotic Note  Tony Sullivan is a 61 y.o. male admitted on 06/17/2022 with  infection of unknown source .  Pharmacy has been consulted for cefepime/vancomycin dosing. SCr pending for this admit.  Noted "anaphylaxis" reaction to PCN in chart, but patient has tolerated cefepime multiple times here in the past without reported issue. Aztreonam initially ordered but changed to cefepime per Pharmacy PCN allergy protocol.  Plan: Cefepime 2g IV x 1 Vancomycin 1250mg  IV x 1 F/u SCr to enter antibiotic maintenance doses Monitor clinical progress, c/s, renal function F/u de-escalation plan/LOT, vancomycin levels as indicated   Height: 5\' 11"  (180.3 cm) Weight: 68 kg (150 lb) IBW/kg (Calculated) : 75.3  Temp (24hrs), Avg:100.2 F (37.9 C), Min:99.6 F (37.6 C), Max:100.7 F (38.2 C)  No results for input(s): "WBC", "CREATININE", "LATICACIDVEN", "VANCOTROUGH", "VANCOPEAK", "VANCORANDOM", "GENTTROUGH", "GENTPEAK", "GENTRANDOM", "TOBRATROUGH", "TOBRAPEAK", "TOBRARND", "AMIKACINPEAK", "AMIKACINTROU", "AMIKACIN" in the last 168 hours.  Estimated Creatinine Clearance: 95.6 mL/min (A) (by C-G formula based on SCr of 0.58 mg/dL (L)).    Allergies  Allergen Reactions   Penicillins Anaphylaxis    Has patient had a PCN reaction causing immediate rash, facial/tongue/throat swelling, SOB or lightheadedness with hypotension: Yes Has patient had a PCN reaction causing severe rash involving mucus membranes or skin necrosis: No Has patient had a PCN reaction that required hospitalization No Has patient had a PCN reaction occurring within the last 10 years: No If all of the above answers are "NO", then may proceed with Cephalosporin use. Cephalosporins ok   Levocetirizine Other (See Comments)    Muscle cramps    [name], PharmD, BCPS Please check AMION for all [facility] contact numbers Clinical Pharmacist 06/17/2022 8:35 PM

## 2022-06-17 NOTE — Progress Notes (Signed)
Pt setup on BIPAP tolerating well.

## 2022-06-17 NOTE — ED Provider Notes (Signed)
[facility] EMERGENCY DEPARTMENT Provider Note   CSN: [id number] Arrival date & time: 06/17/22  1815     History  No chief complaint on file.   Tony Sullivan is a 61 y.o. 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  Patient is on hospice with DNR and DNI in place.  Presents to the emergency department with pain all over and shortness of breath.  Patient states that it was very hot in his apartment today which caused him to start feeling short of breath.  Patient reports being on 8 L of oxygen via nasal cannula at all times.  Patient reports that he is had difficulty breathing throughout today.  Patient also endorses cough.  States that cough is occasionally productive, when productive produces brown to clear mucus.  Patient reports pain is over his entire body.  Pain is worse with touch and movement.  Patient has not tried any medications to help with his pain.  Patient reports that he has not contacted hospice care about his pain and shortness of breath.  Patient denies any recent falls or traumatic injuries.  Patient denies any fevers, chills, hemoptysis, nausea, vomiting, diarrhea, dysuria, hematuria, urinary urgency.    HPI     Home Medications Prior to Admission medications   Medication Sig Start Date End Date Taking? Authorizing Provider  acetaminophen (TYLENOL) 325 MG tablet Take 2 tablets (650 mg total) by mouth every 6 (six) hours as needed for mild pain (or Fever >/= 101). 10/30/21   [name], MD  albuterol (PROVENTIL) (2.5 MG/3ML) 0.083% nebulizer solution Take 3 mLs (2.5 mg total) by nebulization every 4 (four) hours as needed for wheezing or shortness of breath. 05/08/22 05/08/23  [name], MD  albuterol (VENTOLIN HFA) 108 (90 Base) MCG/ACT inhaler Inhale 2 puffs into the lungs every 6 (six) hours as needed for wheezing or shortness of  breath. 05/08/22   [name], MD  ALPRAZolam Duanne Moron) 0.5 MG tablet Take 1 tablet (0.5 mg total) by mouth every 6 (six) hours as needed for anxiety (agitation and comfort.). 06/12/22   Barton Dubois, MD  apixaban (ELIQUIS) 5 MG TABS tablet Take 1 tablet (5 mg total) by mouth 2 (two) times daily. 05/08/22 08/06/22  [name], MD  Budeson-Glycopyrrol-Formoterol (BREZTRI AEROSPHERE) 160-9-4.8 MCG/ACT AERO Inhale 2 puffs into the lungs in the morning and at bedtime. 05/08/22   [name], MD  collagenase (SANTYL) 250 UNIT/GM ointment Apply topically daily. Apply to coccyx and back pressure injuries once daily after cleaning area with normal saline.  Cover with saline dampened gauze and top with silicone foam. 1/74/08   Barton Dubois, MD  dextromethorphan-guaiFENesin [facility] DM) 30-600 MG 12hr tablet Take 1 tablet by mouth 2 (two) times daily. 06/12/22   Barton Dubois, MD  dorzolamide (TRUSOPT) 2 % ophthalmic solution Place 1 drop into the left eye 2 (two) times daily. 12/12/20   [provider]  fluconazole (DIFLUCAN) 200 MG tablet Take 2 tablets (400 mg total) by mouth daily for 5 days. 06/13/22 06/18/22  Barton Dubois, MD  furosemide (LASIX) 20 MG tablet Take 1 tablet (20 mg total) by mouth daily. 05/08/22   [name], MD  hydrOXYzine (ATARAX) 25 MG tablet Take 1 tablet (25 mg total) by mouth 3 (three) times daily as needed for anxiety. 05/08/22   [name], MD  latanoprost (XALATAN) 0.005 % ophthalmic solution  Place 1 drop into both eyes at bedtime. 05/24/19   [provider]  loxapine (LOXITANE) 10 MG capsule Take 1 capsule (10 mg total) by mouth at bedtime. 05/08/22   [name], MD  morphine 20 MG/5ML solution Take 1.3 mLs (5.2 mg total) by mouth every 6 (six) hours as needed for pain (SOB, agitation and distress.). 06/12/22   Barton Dubois, MD  mupirocin ointment (BACTROBAN) 2 % Apply 1 Application topically daily. 06/13/22   Barton Dubois, MD   OXYGEN Inhale 4-5 L into the lungs as directed. 4-5 liters with sleep and exertion as needed for low oxygen    [provider]  pantoprazole (PROTONIX) 40 MG tablet Take 1 tablet (40 mg total) by mouth 2 (two) times daily. 05/08/22   [name], MD  polyethylene glycol (MIRALAX / GLYCOLAX) 17 g packet Take 17 g by mouth daily. 05/08/22   [name], MD  predniSONE (DELTASONE) 10 MG tablet Take 4 tablets by mouth daily x2 days; then 3 tablets by mouth daily x3 days; then 2 tablet by mouth daily moving forward. 06/12/22   Barton Dubois, MD  senna-docusate (SENOKOT-S) 8.6-50 MG tablet Take 2 tablets by mouth at bedtime. Patient taking differently: Take 2 tablets by mouth at bedtime as needed for mild constipation. 10/30/21   [name], MD  silver sulfADIAZINE (SILVADENE) 1 % cream Apply topically daily. Apply to bilateral lower extremity wounds in 1/8 inch layer after cleaning with normal saline, top with saline moistened gauze, ABD pad and secured with Kerlix roll gauze/paper tape. 06/13/22   Barton Dubois, MD      Allergies    Penicillins and Levocetirizine    Review of Systems   Review of Systems  Constitutional:  Negative for chills and fever.  Eyes:  Negative for visual disturbance.  Respiratory:  Positive for shortness of breath.   Cardiovascular:  Negative for chest pain.  Gastrointestinal:  Negative for abdominal pain, nausea and vomiting.  Genitourinary:  Negative for difficulty urinating, dysuria, frequency, hematuria and urgency.  Musculoskeletal:  Positive for myalgias (Generalized). Negative for back pain and neck pain.  Skin:  Negative for color change and rash.  Neurological:  Negative for dizziness, syncope, light-headedness and headaches.  Psychiatric/Behavioral:  Negative for confusion.     Physical Exam Updated Vital Signs BP 117/85 (BP Location: Right Arm)   Pulse (!) 127   Temp 99.6 F (37.6 C) (Oral)   Resp (!) 25   SpO2 (!) 80%  Physical  Exam Vitals and nursing note reviewed.  Constitutional:      General: He is not in acute distress.    Appearance: He is ill-appearing. He is not toxic-appearing or diaphoretic.     Interventions: Nasal cannula in place.     Comments: On 8 LPM of O2 via nasal cannula.  Ill-appearing  HENT:     Head: Normocephalic.  Eyes:     General: No scleral icterus.       Right eye: No discharge.        Left eye: No discharge.  Cardiovascular:     Rate and Rhythm: Tachycardia present.     Pulses:          Radial pulses are 2+ on the right side and 2+ on the left side.     Comments: Tachycardic with rate in the 120s Pulmonary:     Effort: Pulmonary effort is normal. Tachypnea present. No respiratory distress.     Breath sounds: No stridor.  Comments: Rhonchorous lung sounds throughout, can shorten sentences Abdominal:     General: Abdomen is flat. There is no distension. There are no signs of injury.     Palpations: Abdomen is soft. There is no mass or pulsatile mass.     Tenderness: There is no abdominal tenderness. There is no guarding or rebound.  Skin:    General: Skin is warm and dry.  Neurological:     General: No focal deficit present.     Mental Status: He is alert.  Psychiatric:        Behavior: Behavior is cooperative.     ED Results / Procedures / Treatments   Labs (all labs ordered are listed, but only abnormal results are displayed) Labs Reviewed  COMPREHENSIVE METABOLIC PANEL - Abnormal; Notable for the following components:      Result Value   Potassium 3.4 (*)    CO2 34 (*)    BUN 21 (*)    Calcium 8.1 (*)    Total Protein 4.9 (*)    Albumin 1.7 (*)    AST 42 (*)    ALT 45 (*)    Total Bilirubin 0.2 (*)    All other components within normal limits  CBC WITH DIFFERENTIAL/PLATELET - Abnormal; Notable for the following components:   RBC 3.03 (*)    Hemoglobin 9.2 (*)    HCT 30.8 (*)    MCV 101.7 (*)    MCHC 29.9 (*)    RDW 17.2 (*)    Abs Immature  Granulocytes 0.21 (*)    All other components within normal limits  URINALYSIS, ROUTINE W REFLEX MICROSCOPIC - Abnormal; Notable for the following components:   Color, Urine AMBER (*)    APPearance HAZY (*)    Protein, ur 30 (*)    Bacteria, UA RARE (*)    All other components within normal limits  RAPID URINE DRUG SCREEN, HOSP PERFORMED - Abnormal; Notable for the following components:   Cocaine POSITIVE (*)    All other components within normal limits  BLOOD GAS, VENOUS - Abnormal; Notable for the following components:   pCO2, Ven 80 (*)    Bicarbonate 40.1 (*)    Acid-Base Excess 12.0 (*)    All other components within normal limits  CULTURE, BLOOD (ROUTINE X 2)  CULTURE, BLOOD (ROUTINE X 2)  SARS CORONAVIRUS 2 BY RT PCR  URINE CULTURE  FUNGUS CULTURE, BLOOD  LACTIC ACID, PLASMA  ETHANOL  LACTIC ACID, PLASMA    EKG None  Radiology DG Chest Portable 1 View  Result Date: 06/17/2022 CLINICAL DATA:  Shortness of breath EXAM: PORTABLE CHEST 1 VIEW COMPARISON:  06/11/2022 FINDINGS: Complete opacification of the left hemithorax with volume loss and shift of mediastinal contours to the left. No confluent opacities on the right. Areas of scarring in the right lung. No right effusion. No acute bony abnormality. IMPRESSION: Volume loss with complete opacification of the left hemithorax. Electronically Signed   By: Rolm Baptise M.D.   On: 06/17/2022 20:10    Procedures .Critical Care  Performed by: Loni Beckwith, PA-C Authorized by: Loni Beckwith, PA-C   Critical care provider statement:    Critical care time (minutes):  75   Critical care was necessary to treat or prevent imminent or life-threatening deterioration of the following conditions:  Respiratory failure and sepsis   Critical care was time spent personally by me on the following activities:  Development of treatment plan with patient or surrogate, evaluation of patient's  response to treatment, examination of  patient, ordering and review of laboratory studies, ordering and review of radiographic studies, ordering and performing treatments and interventions, pulse oximetry, re-evaluation of patient's condition, review of old charts and obtaining history from patient or surrogate   Care discussed with: admitting provider       Medications Ordered in ED Medications  lactated ringers infusion ( Intravenous New Bag/Given 06/17/22 2117)  vancomycin (VANCOREADY) IVPB 1250 mg/250 mL (1,250 mg Intravenous New Bag/Given 06/17/22 2228)  ceFEPIme (MAXIPIME) 2 g in sodium chloride 0.9 % 100 mL IVPB (has no administration in time range)  vancomycin (VANCOCIN) IVPB 1000 mg/200 mL premix (has no administration in time range)  HYDROmorphone (DILAUDID) injection 1 mg (1 mg Intravenous Given 06/17/22 1941)  lactated ringers bolus 1,000 mL (0 mLs Intravenous Stopped 06/17/22 2228)  acetaminophen (TYLENOL) tablet 1,000 mg (1,000 mg Oral Given 06/17/22 2100)  metroNIDAZOLE (FLAGYL) IVPB 500 mg (0 mg Intravenous Stopped 06/17/22 2211)  ceFEPIme (MAXIPIME) 2 g in sodium chloride 0.9 % 100 mL IVPB (0 g Intravenous Stopped 06/17/22 2211)    ED Course/ Medical Decision Making/ A&P Clinical Course as of 06/17/22 2308  Mon Jun 17, 2022  1946 I spoke to [place] with [facility] hospice and advised her of patient's current situation and likely admission [PB]    Clinical Course User Index [PB] Loni Beckwith, PA-C                           Medical Decision Making Amount and/or Complexity of Data Reviewed Labs: ordered. Radiology: ordered.  Risk OTC drugs. Prescription drug management. Decision regarding hospitalization.   Alert 61 year old ill-appearing male presents to the emergency department with a chief complaint of generalized pain and shortness of breath.    Information was obtained from patient.  I reviewed patient's past medical records including previous provider notes, labs, and imaging.  Patient has  medical history as outlined in HPI which complicates his care.  Per chart review patient was recently admitted to the emergency department for acute encephalopathy secondary to sepsis due to candidemia.  Patient was discharged on 8/16.  Patient was discharged on hospice with plan for symptomatic management/comfort care.  Was discharged on oral Diflucan for 2 weeks.  Patient continues to want to be DNR/DNI however states that he wants lab work, imaging, and antibiotics plus medications as needed.  Due to vitals concern for possible sepsis.  We will hold 30 mg/kg fluid bolus until lactic acid is obtained.  Patient placed on nonrebreather with improvement in oxygen saturation.  We will give patient Dilaudid for pain management.  Patient was discussed with and evaluated by Dr. Doren Custard.  I personally viewed and interpreted patient's chest x-ray.  Agree with radiology interpretation of opacification of the left hemothorax.  I personally viewed and interpreted patient's lab results.  Pertinent findings include: -COVID 19 negative -UA shows no signs of infection -UDS positive for cocaine -No leukocytosis, anemia with hemoglobin 9.2 is baseline for patient -Lactic acid 1.0 -Ethanol unremarkable -VBG shows CO2 of 80  Patient did well on nonrebreather however began having increased effort of breathing, BiPAP was ordered.  Prior to BiPAP being placed patient had oxygen desaturation event.  While there is no obvious source of infection at this time we will start patient on empiric antibiotics.  We will consult hospitalist for admission.  I spoke to Dr. Clearence Ped who will see the patient for admission.  Final Clinical Impression(s) / ED Diagnoses Final diagnoses:  Sepsis with acute hypercapnic respiratory failure without septic shock, due to unspecified organism [facility])    Rx / DC Orders ED Discharge Orders     None         Dyann Ruddle 06/17/22 2308    Godfrey Pick, MD 06/19/22 [phone]

## 2022-06-17 NOTE — Progress Notes (Signed)
Pharmacy Antibiotic Note  Tony Sullivan is a 61 y.o. male admitted on 06/17/2022 with  infection of unknown source .  Pharmacy has been consulted for cefepime/vancomycin dosing.   Noted "anaphylaxis" reaction to PCN in chart, but patient has tolerated cefepime multiple times here in the past without reported issue. Aztreonam initially ordered but changed to cefepime per Pharmacy PCN allergy protocol.  Plan: Cefepime 2g IV q8h Vancomycin 1250mg  IV x 1, then start vancomycin 1000mg  q12hr unless change in renal function (eAUC 504.9) F/u SCr to enter antibiotic maintenance doses Monitor clinical progress, c/s, renal function F/u de-escalation plan/LOT, vancomycin levels as indicated   Height: 5\' 11"  (180.3 cm) Weight: 68 kg (150 lb) IBW/kg (Calculated) : 75.3  Temp (24hrs), Avg:100.2 F (37.9 C), Min:99.6 F (37.6 C), Max:100.7 F (38.2 C)  Recent Labs  Lab 06/17/22 2106  WBC 6.7  CREATININE 0.83  LATICACIDVEN 1.0    Estimated Creatinine Clearance: 92.2 mL/min (by C-G formula based on SCr of 0.83 mg/dL).    Allergies  Allergen Reactions   Penicillins Anaphylaxis    Has patient had a PCN reaction causing immediate rash, facial/tongue/throat swelling, SOB or lightheadedness with hypotension: Yes Has patient had a PCN reaction causing severe rash involving mucus membranes or skin necrosis: No Has patient had a PCN reaction that required hospitalization No Has patient had a PCN reaction occurring within the last 10 years: No If all of the above answers are "NO", then may proceed with Cephalosporin use. Cephalosporins ok   Levocetirizine Other (See Comments)    Muscle cramps    Lorelei Pont, PharmD, BCPS 06/17/2022 10:21 PM ED Clinical Pharmacist -  [phone]

## 2022-06-17 NOTE — ED Triage Notes (Signed)
Pt BIB [facility], called out for SOB. Pt has hx of lung cancer. Pt reports 10/10 pain "all over." O2 sats 78% on 8L from home

## 2022-06-18 ENCOUNTER — Encounter (HOSPITAL_COMMUNITY): Payer: Self-pay | Admitting: Family Medicine

## 2022-06-18 DIAGNOSIS — Z7189 Other specified counseling: Secondary | ICD-10-CM | POA: Diagnosis not present

## 2022-06-18 DIAGNOSIS — A419 Sepsis, unspecified organism: Secondary | ICD-10-CM | POA: Diagnosis not present

## 2022-06-18 DIAGNOSIS — E876 Hypokalemia: Secondary | ICD-10-CM

## 2022-06-18 DIAGNOSIS — J9621 Acute and chronic respiratory failure with hypoxia: Secondary | ICD-10-CM | POA: Diagnosis not present

## 2022-06-18 DIAGNOSIS — J441 Chronic obstructive pulmonary disease with (acute) exacerbation: Secondary | ICD-10-CM | POA: Diagnosis not present

## 2022-06-18 DIAGNOSIS — B377 Candidal sepsis: Secondary | ICD-10-CM

## 2022-06-18 DIAGNOSIS — J9600 Acute respiratory failure, unspecified whether with hypoxia or hypercapnia: Secondary | ICD-10-CM

## 2022-06-18 DIAGNOSIS — Z515 Encounter for palliative care: Secondary | ICD-10-CM | POA: Diagnosis not present

## 2022-06-18 DIAGNOSIS — R6520 Severe sepsis without septic shock: Secondary | ICD-10-CM

## 2022-06-18 DIAGNOSIS — F1910 Other psychoactive substance abuse, uncomplicated: Secondary | ICD-10-CM

## 2022-06-18 DIAGNOSIS — C3492 Malignant neoplasm of unspecified part of left bronchus or lung: Secondary | ICD-10-CM

## 2022-06-18 DIAGNOSIS — Z8619 Personal history of other infectious and parasitic diseases: Secondary | ICD-10-CM | POA: Insufficient documentation

## 2022-06-18 LAB — BLOOD GAS, VENOUS
Acid-Base Excess: 7.1 mmol/L — ABNORMAL HIGH (ref 0.0–2.0)
Bicarbonate: 33.9 mmol/L — ABNORMAL HIGH (ref 20.0–28.0)
Drawn by: 53361
O2 Saturation: 84.7 %
Patient temperature: 36.2
pCO2, Ven: 54 mmHg (ref 44–60)
pH, Ven: 7.4 (ref 7.25–7.43)
pO2, Ven: 50 mmHg — ABNORMAL HIGH (ref 32–45)

## 2022-06-18 LAB — CBC WITH DIFFERENTIAL/PLATELET
Abs Immature Granulocytes: 0.12 10*3/uL — ABNORMAL HIGH (ref 0.00–0.07)
Basophils Absolute: 0 10*3/uL (ref 0.0–0.1)
Basophils Relative: 1 %
Eosinophils Absolute: 0 10*3/uL (ref 0.0–0.5)
Eosinophils Relative: 1 %
HCT: 28.9 % — ABNORMAL LOW (ref 39.0–52.0)
Hemoglobin: 8.6 g/dL — ABNORMAL LOW (ref 13.0–17.0)
Immature Granulocytes: 2 %
Lymphocytes Relative: 12 %
Lymphs Abs: 0.7 10*3/uL (ref 0.7–4.0)
MCH: 30.4 pg (ref 26.0–34.0)
MCHC: 29.8 g/dL — ABNORMAL LOW (ref 30.0–36.0)
MCV: 102.1 fL — ABNORMAL HIGH (ref 80.0–100.0)
Monocytes Absolute: 0.3 10*3/uL (ref 0.1–1.0)
Monocytes Relative: 5 %
Neutro Abs: 4.4 10*3/uL (ref 1.7–7.7)
Neutrophils Relative %: 79 %
Platelets: 140 10*3/uL — ABNORMAL LOW (ref 150–400)
RBC: 2.83 MIL/uL — ABNORMAL LOW (ref 4.22–5.81)
RDW: 17.3 % — ABNORMAL HIGH (ref 11.5–15.5)
WBC: 5.5 10*3/uL (ref 4.0–10.5)
nRBC: 0 % (ref 0.0–0.2)

## 2022-06-18 LAB — COMPREHENSIVE METABOLIC PANEL
ALT: 42 U/L (ref 0–44)
AST: 32 U/L (ref 15–41)
Albumin: 1.5 g/dL — ABNORMAL LOW (ref 3.5–5.0)
Alkaline Phosphatase: 88 U/L (ref 38–126)
Anion gap: 5 (ref 5–15)
BUN: 21 mg/dL — ABNORMAL HIGH (ref 6–20)
CO2: 30 mmol/L (ref 22–32)
Calcium: 7.8 mg/dL — ABNORMAL LOW (ref 8.9–10.3)
Chloride: 102 mmol/L (ref 98–111)
Creatinine, Ser: 0.69 mg/dL (ref 0.61–1.24)
GFR, Estimated: >60 mL/min (ref >60)
Glucose, Bld: 70 mg/dL (ref 70–99)
Potassium: 3.4 mmol/L — ABNORMAL LOW (ref 3.5–5.1)
Sodium: 137 mmol/L (ref 135–145)
Total Bilirubin: 0.8 mg/dL (ref 0.3–1.2)
Total Protein: 4.3 g/dL — ABNORMAL LOW (ref 6.5–8.1)

## 2022-06-18 LAB — BLOOD GAS, ARTERIAL
Acid-Base Excess: 9.4 mmol/L — ABNORMAL HIGH (ref 0.0–2.0)
Bicarbonate: 36.2 mmol/L — ABNORMAL HIGH (ref 20.0–28.0)
Drawn by: 21310
O2 Saturation: 81.5 %
Patient temperature: 36.7
pCO2 arterial: 63 mmHg — ABNORMAL HIGH (ref 32–48)
pH, Arterial: 7.36 (ref 7.35–7.45)
pO2, Arterial: 50 mmHg — ABNORMAL LOW (ref 83–108)

## 2022-06-18 LAB — PROCALCITONIN: Procalcitonin: 1.34 ng/mL

## 2022-06-18 LAB — MAGNESIUM: Magnesium: 2.1 mg/dL (ref 1.7–2.4)

## 2022-06-18 LAB — PROTIME-INR
INR: 1 (ref 0.8–1.2)
Prothrombin Time: 13.3 seconds (ref 11.4–15.2)

## 2022-06-18 LAB — CORTISOL-AM, BLOOD: Cortisol - AM: 16.2 ug/dL (ref 6.7–22.6)

## 2022-06-18 MED ORDER — METHYLPREDNISOLONE SODIUM SUCC 125 MG IJ SOLR
125.0000 mg | Freq: Every day | INTRAMUSCULAR | Status: DC
  Administered 2022-06-18 – 2022-06-19 (×2): 125 mg via INTRAVENOUS
  Filled 2022-06-18 (×2): qty 2

## 2022-06-18 MED ORDER — CHLORHEXIDINE GLUCONATE CLOTH 2 % EX PADS
6.0000 | MEDICATED_PAD | Freq: Every day | CUTANEOUS | Status: DC
  Administered 2022-06-18 – 2022-06-19 (×2): 6 via TOPICAL

## 2022-06-18 MED ORDER — POTASSIUM CHLORIDE 20 MEQ PO PACK
40.0000 meq | PACK | Freq: Every day | ORAL | Status: DC
  Administered 2022-06-18 – 2022-06-19 (×2): 40 meq via ORAL
  Filled 2022-06-18 (×2): qty 2

## 2022-06-18 MED ORDER — MORPHINE SULFATE (PF) 2 MG/ML IV SOLN
2.0000 mg | INTRAVENOUS | Status: DC | PRN
  Administered 2022-06-18 – 2022-06-19 (×6): 2 mg via INTRAVENOUS
  Filled 2022-06-18 (×6): qty 1

## 2022-06-18 MED ORDER — FLUCONAZOLE IN SODIUM CHLORIDE 200-0.9 MG/100ML-% IV SOLN
400.0000 mg | INTRAVENOUS | Status: DC
Start: 2022-06-18 — End: 2022-06-19
  Administered 2022-06-18 – 2022-06-19 (×2): 400 mg via INTRAVENOUS
  Filled 2022-06-18 (×3): qty 200

## 2022-06-18 MED ORDER — FLUCONAZOLE IN SODIUM CHLORIDE 400-0.9 MG/200ML-% IV SOLN: 400.0000 mg | INTRAVENOUS | Status: DC

## 2022-06-18 MED ORDER — ENOXAPARIN SODIUM 80 MG/0.8ML IJ SOSY
1.0000 mg/kg | PREFILLED_SYRINGE | Freq: Once | INTRAMUSCULAR | Status: AC
Start: 2022-06-18 — End: 2022-06-18
  Administered 2022-06-18: 67.5 mg via SUBCUTANEOUS
  Filled 2022-06-18: qty 0.8

## 2022-06-18 MED ORDER — POTASSIUM CHLORIDE 20 MEQ PO PACK: 20.0000 meq | PACK | Freq: Every day | ORAL | Status: DC

## 2022-06-18 MED ORDER — FLUCONAZOLE IN SODIUM CHLORIDE 200-0.9 MG/100ML-% IV SOLN
INTRAVENOUS | Status: AC
  Filled 2022-06-18: qty 200

## 2022-06-18 NOTE — Assessment & Plan Note (Signed)
-   Fever 100.7, tachycardia, tachypnea, with acute respiratory failure -Concern for postobstructive pneumonia -Chest x-ray shows volume loss with complete opacification of the left hemithorax -UA is not indicative of UTI -Blood cultures pending -Urine culture pending -Procalcitonin pending -Patient started on broad-spectrum antibiotics, continue -Continue patient's Diflucan -Consult infectious disease -Continue to monitor

## 2022-06-18 NOTE — TOC Initial Note (Signed)
Transition of Care [facility]) - Initial/Assessment Note    Patient Details  Name: Tony Sullivan MRN: [id number] Date of Birth: 1961-08-04  [facility] CM/SW Contact:    Iona Beard, [place] Phone Number: 06/18/2022, 1:30 PM  Clinical Narrative:                 Pt is high risk for readmission. Per chart review pt recently D/C from hospital with hospice at home care. Plan will be for return home with hospice care continued. TOC to follow.   Expected Discharge Plan: Home w Hospice Care Barriers to Discharge: No Barriers Identified   Patient Goals and CMS Choice Patient states their goals for this hospitalization and ongoing recovery are:: return home CMS Medicare.gov Compare Post Acute Care list provided to:: Patient Choice offered to / list presented to : Patient  Expected Discharge Plan and Services Expected Discharge Plan: [place] Acute Care Choice: Resumption of Svcs/PTA Provider Living arrangements for the past 2 months: Apartment                                      Prior Living Arrangements/Services Living arrangements for the past 2 months: Apartment   Patient language and need for interpreter reviewed:: Yes Do you feel safe going back to the place where you live?: Yes      Need for Family Participation in Patient Care: Yes (Comment) Care giver support system in place?: Yes (comment) Current home services: DME, Hospice Criminal Activity/Legal Involvement Pertinent to Current Situation/Hospitalization: No - Comment as needed  Activities of Daily Living Home Assistive Devices/Equipment: None ADL Screening (condition at time of admission) Patient's cognitive ability adequate to safely complete daily activities?: No Is the patient deaf or have difficulty hearing?: No Does the patient have difficulty seeing, even when wearing glasses/contacts?: No Does the patient have difficulty concentrating, remembering, or making decisions?:  No Patient able to express need for assistance with ADLs?: Yes Does the patient have difficulty dressing or bathing?: Yes Independently performs ADLs?: No Communication: Independent Does the patient have difficulty walking or climbing stairs?: Yes Weakness of Legs: Both Weakness of Arms/Hands: Both  Permission Sought/Granted                  Emotional Assessment Appearance:: Appears stated age Attitude/Demeanor/Rapport: Engaged Affect (typically observed): Accepting Orientation: : Oriented to Self, Oriented to  Time, Oriented to Place, Oriented to Situation Alcohol / Substance Use: Not Applicable Psych Involvement: No (comment)  Admission diagnosis:  Sepsis ([place]) [A41.9] Sepsis with acute hypercapnic respiratory failure without septic shock, due to unspecified organism ([place]) [A41.9, R65.20, J96.02] Patient Active Problem List   Diagnosis Date Noted   History of pediculosis 06/18/2022   Sepsis ([place]) 06/17/2022   Candidemia ([place]) 06/02/2022   Encephalopathy 05/31/2022   Hypothermia 05/31/2022   Acute encephalopathy 05/31/2022   Substance abuse ([place]) 05/31/2022   Leg wound, left 05/31/2022   Hospice care 05/26/2022   Decubitus ulcer of back, stage 2 ([place])    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[place])    Acute and chronic respiratory failure with hypoxia ([place]) 05/06/2022   Blistered skin right foot/left leg 05/06/2022   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/2022   Pressure injury of skin 05/06/2022   Acute on chronic respiratory failure with hypoxia and hypercapnia (HCC) 04/29/2022   Hyperkalemia 04/29/2022  AKI (acute kidney injury) ([place]) 04/29/2022   Non-small cell lung cancer ([place]) 04/29/2022   History of pulmonary embolism 04/29/2022   H/o Pulmonary embolism ([place]) 04/01/2022   Mass of upper lobe of left lung    Thoracic compression fracture ([place]) 03/20/2022   Hemoptysis 03/20/2022   Pneumonia 03/13/2022   COPD exacerbation ([place]) 02/27/2022    Pedal edema 02/19/2022   CAP (community acquired pneumonia) 01/07/2022   Prolonged QT interval 01/07/2022   Glaucoma 01/07/2022   Elevated troponin 10/27/2021   Hyperglycemia 10/27/2021   Elevated MCV 10/27/2021   Respiratory failure with hypoxia and hypercapnia ([place]) 10/27/2021   Acute exacerbation of chronic obstructive pulmonary disease (COPD) ([place]) 10/02/2021   Hypophosphatemia 10/02/2021   Physical deconditioning 07/26/2021   Numbness and tingling in left arm 05/06/2021   Leukocytosis 05/05/2021   Severe headache 05/05/2021   Mixed hyperlipidemia 05/24/2020   Hypokalemia 05/24/2020   Chest pain 05/23/2020   Bipolar disorder ([place]) 05/23/2020   HNP (herniated nucleus pulposus), cervical 09/06/2019   Abdominal pain    Poor dentition 11/04/2017   Acute on chronic respiratory failure with hypercapnia ([place]) 08/29/2017   ETOH abuse 03/30/2017   Cigarette smoker 03/30/2017   Acute respiratory failure with hypoxia and hypercapnia (HCC) 03/29/2017   Special screening for malignant neoplasms, colon    Benign neoplasm of ascending colon    Benign neoplasm of descending colon    Hoarseness 08/22/2016   GERD (gastroesophageal reflux disease) 08/22/2016   Atypical chest pain 12/05/2015   Essential hypertension 06/15/2014   Pectoralis muscle strain 01/11/2014   COPD (chronic obstructive pulmonary disease) ([place]) 01/08/2014   Tobacco abuse 01/08/2014   Chronic Cough 01/08/2014   PCP:  [name], FNP Pharmacy:   [facility], [place] [address] AT [place] [place] [address] Phone: [phone] Fax: [phone]  [place], [address] [place] [address] Phone: [phone] Fax: [phone]     Social Determinants of Health (SDOH) Interventions    Readmission Risk Interventions    06/18/2022    1:25 PM 06/02/2022    11:59 AM 05/06/2022   11:49 AM  Readmission Risk Prevention Plan  Transportation Screening Complete Complete Complete  Medication Review (RN Care Manager) Complete Complete Complete  HRI or Home Care Consult Complete Complete Complete  SW Recovery Care/Counseling Consult Complete Complete Complete  Palliative Care Screening Complete Not Applicable Not [place] Not Applicable Not Applicable Not Applicable

## 2022-06-18 NOTE — Assessment & Plan Note (Signed)
-   Last discharged with comfort measures only and hospice care -Returns saying that he wants to remain DNR, DNI, but wants treatment - Consult palliative care for goals of care discussion - If he decides to pursue full treatment consider consult with oncology

## 2022-06-18 NOTE — Assessment & Plan Note (Signed)
-   Last admission was for sepsis secondary to candidemia -Patient has not yet finished his Diflucan course, which should have finished August 22 -Continue Diflucan -Consult infectious disease -Repeat blood cultures -Continue to monitor

## 2022-06-18 NOTE — H&P (Addendum)
History and Physical    Patient: Tony Sullivan DOB: [birth year] DOA: 06/17/2022 DOS: the patient was seen and examined on 06/18/2022 PCP: [name], FNP  Patient coming from: Home  Chief Complaint:  Chief Complaint  Patient presents with   Shortness of Breath   HPI: Tony Sullivan is a 61 y.o. male with medical history significant of non-small cell lung cancer, COPD, depression, GERD, anxiety, hypertension, prediabetes, substance abuse, and more presents the ED with a chief complaint of dyspnea and pain all over.  Patient is very difficult to get history from.  He reports that he is too tired to give history anyways.  When pressed he says that he is chronically short of breath and has pain all over due to cancer.  When asked why he came in today, he reports because he got too hot in the house.  At the time of my exam he reports the only thing that is in pain in the bottom of his feet.  He is resting comfortably on BiPAP.  He is not answering more questions at this time.  Patient was  recently discharged on 06/12/2022.  At that time he had been hospitalized for respiratory decompensation due to left lung collapse with mucous plugging and bronchogenic carcinoma.  He declined a bronchoscopy during that hospitalization.  Patient and family requested patient to be DNR/DNI.  During that hospitalization he was positive for cocaine and opiates.  He is again positive for cocaine today.  He had sepsis secondary to candidemia at that time.  ID had recommended stopping broad-spectrum antibiotics and continue Diflucan.  2D echo did not show any valvular vegetations.  Patient was discouraged with Diflucan.  Patient is DNR/DNI. Review of Systems: unable to review all systems due to the inability of the patient to answer questions. Past Medical History:  Diagnosis Date   Anxiety    Arthritis    COPD (chronic obstructive pulmonary disease) ([place]) 2004   diagnosed in 2004, no PFT's to date.   Started on home O2 12/2013, after found to be desatting at PCP's office, and referred to pulmonology.   Depression    GERD (gastroesophageal reflux disease)    High blood pressure    Prediabetes    Seasonal allergies    Substance abuse ([place])    Tobacco dependence    Past Surgical History:  Procedure Laterality Date   ANTERIOR CERVICAL DECOMP/DISCECTOMY FUSION N/A 09/06/2019   Procedure: Anterior Cervical Discecectomy Fusion - Cerivcal Five-Cervical Six;  Surgeon: Kary Kos, MD;  Location: [facility];  Service: Neurosurgery;  Laterality: N/A;  Anterior Cervical Discecectomy Fusion - Cerivcal Five-Cervical Six   ANTERIOR CERVICAL DISCECTOMY  09/06/2019   APPENDECTOMY  1980   COLONOSCOPY WITH PROPOFOL N/A 12/10/2016   Procedure: COLONOSCOPY WITH PROPOFOL;  Surgeon: [name], MD;  Location: [facility];  Service: Endoscopy;  Laterality: N/A;   FLEXIBLE BRONCHOSCOPY Left 03/22/2022   Procedure: FLEXIBLE BRONCHOSCOPY;  Surgeon: Rigoberto Noel, MD;  Location: [facility];  Service: Pulmonary;  Laterality: Left;   HIP SURGERY Right    SHOULDER ARTHROSCOPY Right    Social History:  reports that he has been smoking cigarettes. He has a 9.00 pack-year smoking history. He has never used smokeless tobacco. He reports that he does not currently use alcohol after a past usage of about 14.0 - 21.0 standard drinks of alcohol per week. He reports current drug use. Drugs: Marijuana and Cocaine.  Allergies  Allergen Reactions   Penicillins  Anaphylaxis    Has patient had a PCN reaction causing immediate rash, facial/tongue/throat swelling, SOB or lightheadedness with hypotension: Yes Has patient had a PCN reaction causing severe rash involving mucus membranes or skin necrosis: No Has patient had a PCN reaction that required hospitalization No Has patient had a PCN reaction occurring within the last 10 years: No If all of the above answers are "NO", then may proceed with Cephalosporin use. Cephalosporins  ok   Levocetirizine Other (See Comments)    Muscle cramps    Family History  Problem Relation Age of Onset   Emphysema Mother    Allergies Mother        "everyone in family"   Asthma Mother        "everyone in family"   Heart disease Mother    Clotting disorder Mother    Cancer Mother    Emphysema Father    Allergies Father    Allergies Son    Seizures Brother    Diabetes Brother     Prior to Admission medications   Medication Sig Start Date End Date Taking? Authorizing Provider  acetaminophen (TYLENOL) 325 MG tablet Take 2 tablets (650 mg total) by mouth every 6 (six) hours as needed for mild pain (or Fever >/= 101). 10/30/21   [name], MD  albuterol (PROVENTIL) (2.5 MG/3ML) 0.083% nebulizer solution Take 3 mLs (2.5 mg total) by nebulization every 4 (four) hours as needed for wheezing or shortness of breath. 05/08/22 05/08/23  [name], MD  albuterol (VENTOLIN HFA) 108 (90 Base) MCG/ACT inhaler Inhale 2 puffs into the lungs every 6 (six) hours as needed for wheezing or shortness of breath. 05/08/22   [name], MD  ALPRAZolam Duanne Moron) 0.5 MG tablet Take 1 tablet (0.5 mg total) by mouth every 6 (six) hours as needed for anxiety (agitation and comfort.). 06/12/22   Barton Dubois, MD  apixaban (ELIQUIS) 5 MG TABS tablet Take 1 tablet (5 mg total) by mouth 2 (two) times daily. 05/08/22 08/06/22  [name], MD  Budeson-Glycopyrrol-Formoterol (BREZTRI AEROSPHERE) 160-9-4.8 MCG/ACT AERO Inhale 2 puffs into the lungs in the morning and at bedtime. 05/08/22   [name], MD  collagenase (SANTYL) 250 UNIT/GM ointment Apply topically daily. Apply to coccyx and back pressure injuries once daily after cleaning area with normal saline.  Cover with saline dampened gauze and top with silicone foam. 1/59/45   Barton Dubois, MD  dextromethorphan-guaiFENesin [facility] DM) 30-600 MG 12hr tablet Take 1 tablet by mouth 2 (two) times daily. 06/12/22   Barton Dubois, MD   dorzolamide (TRUSOPT) 2 % ophthalmic solution Place 1 drop into the left eye 2 (two) times daily. 12/12/20   [provider]  fluconazole (DIFLUCAN) 200 MG tablet Take 2 tablets (400 mg total) by mouth daily for 5 days. 06/13/22 06/18/22  Barton Dubois, MD  furosemide (LASIX) 20 MG tablet Take 1 tablet (20 mg total) by mouth daily. 05/08/22   [name], MD  hydrOXYzine (ATARAX) 25 MG tablet Take 1 tablet (25 mg total) by mouth 3 (three) times daily as needed for anxiety. 05/08/22   Emokpae, Courage, MD  latanoprost (XALATAN) 0.005 % ophthalmic solution Place 1 drop into both eyes at bedtime. 05/24/19   [provider]  loxapine (LOXITANE) 10 MG capsule Take 1 capsule (10 mg total) by mouth at bedtime. 05/08/22   [name], MD  morphine 20 MG/5ML solution Take 1.3 mLs (5.2 mg total) by mouth every 6 (six) hours as needed for pain (SOB,  agitation and distress.). 06/12/22   Barton Dubois, MD  mupirocin ointment (BACTROBAN) 2 % Apply 1 Application topically daily. 06/13/22   Barton Dubois, MD  OXYGEN Inhale 4-5 L into the lungs as directed. 4-5 liters with sleep and exertion as needed for low oxygen    [provider]  pantoprazole (PROTONIX) 40 MG tablet Take 1 tablet (40 mg total) by mouth 2 (two) times daily. 05/08/22   [name], MD  polyethylene glycol (MIRALAX / GLYCOLAX) 17 g packet Take 17 g by mouth daily. 05/08/22   [name], MD  predniSONE (DELTASONE) 10 MG tablet Take 4 tablets by mouth daily x2 days; then 3 tablets by mouth daily x3 days; then 2 tablet by mouth daily moving forward. 06/12/22   Barton Dubois, MD  senna-docusate (SENOKOT-S) 8.6-50 MG tablet Take 2 tablets by mouth at bedtime. Patient taking differently: Take 2 tablets by mouth at bedtime as needed for mild constipation. 10/30/21   [name], MD  silver sulfADIAZINE (SILVADENE) 1 % cream Apply topically daily. Apply to bilateral lower extremity wounds in 1/8 inch layer after  cleaning with normal saline, top with saline moistened gauze, ABD pad and secured with Kerlix roll gauze/paper tape. 06/13/22   Barton Dubois, MD    Physical Exam: Vitals:   06/18/22 0200 06/18/22 0300 06/18/22 0400 06/18/22 0434  BP: 112/66 96/64 114/68 114/68  Pulse: 98 94 100 90  Resp: _0 Temp:   (!) 97.2 F (36.2 C)   TempSrc:   Axillary   SpO2: 100% 100% 97% 100%  Weight:   63.9 kg   Height:       1.  General: Patient lying supine in bed, resting comfortably on BiPAP   2. Psychiatric: Somnolent and oriented x 3, reporting that he cannot answer more questions right now because he is too tired  3. Neurologic: Speech and language are normal, face is symmetric, moves all 4 extremities voluntarily, at baseline without acute deficits on limited exam   4. HEENMT:  Head is atraumatic, normocephalic, pupils reactive to light, neck is supple, mucous membranes are moist   5. Respiratory : Nearly no audible breath sounds on the left, wheezing on the right, breathing comfortably on BiPAP   6. Cardiovascular : Heart rate normal, rhythm is regular, no murmurs, rubs or gallops, no peripheral edema, peripheral pulses palpated   7. Gastrointestinal:  Abdomen is soft, nondistended, nontender to palpation bowel sounds active, no masses or organomegaly palpated   8. Skin:  Bandage on lower extremity due to blistering   9.Musculoskeletal:  No acute deformities or trauma, no asymmetry in tone, no peripheral edema, peripheral pulses palpated, no tenderness to palpation in the extremities  Data Reviewed: In the ED Temp 99.6-100.7, heart rate 104-127, respiratory rate 13-25, blood pressure 97/64-135/92, satting 80-97% pH 7.3 very close to baseline, with PCO2 80 No leukocytosis at 6.7, hemoglobin 9.2, platelets 165 Patient is hypokalemic Albumin very low at 1.7 AST 42, ALT 45 UA is hazy, amber, with proteinuria and rare bacteria, not indicative of UTI Blood cultures and urine  culture pending Chest x-ray shows volume loss with a complete opacification of left hemithorax Started on vancomycin and cefepime as well as Flagyl for sepsis of unknown origin -Admission requested for sepsis management  Assessment and Plan: * Sepsis ([place]) - Fever 100.7, tachycardia, tachypnea, with acute respiratory failure -Concern for postobstructive pneumonia -Chest x-ray shows volume loss with complete opacification of the left hemithorax -UA is not indicative  of UTI -Blood cultures pending -Urine culture pending -Procalcitonin pending -Patient started on broad-spectrum antibiotics, continue -Continue patient's Diflucan -Consult infectious disease -Continue to monitor  COPD (chronic obstructive pulmonary disease) (H[facility]) - With pH 7.3, PCO2 of 80 -Continue BiPAP -Continue albuterol -Continue antibiotics -Continue steroid -Continue to monitor  History of PE Continue apixaban  Candidemia (H[facility]) - Last admission was for sepsis secondary to candidemia -Patient has not yet finished his Diflucan course, which should have finished August 22 -Continue Diflucan -Consult infectious disease -Repeat blood cultures -Continue to monitor  Substance abuse ([place]) - UDS positive for cocaine -Counseled on the importance of cessation  Acute and chronic respiratory failure with hypoxia (HCC) -Baseline 4-5 L nasal cannula - Patient satting in the 70s on 8 L nasal cannula at arrival, now on BiPAP -Blood gas showed a PCO2 of 80 -Midnight blood gas shows a PCO2 of 63 -Repeat blood gas at 5 AM -Patient maintaining oxygen saturations on BiPAP -Patient is DNR/DNI -Chest x-ray today shows volume loss with complete opacification of the left hemithorax--concern for postobstructive pneumonia -Continue broad-spectrum antibiotics -Procalcitonin pending -Continue to monitor  Non-small cell lung cancer ([place]) - Last discharged with comfort measures only and hospice care -Returns saying that he  wants to remain DNR, DNI, but wants treatment - Consult palliative care for goals of care discussion - If he decides to pursue full treatment consider consult with oncology  Hypokalemia Potassium 3.4 but likely to have diluted given fluids in the ED Daily potassium supplement 20 mEq Daily BMP      Advance Care Planning:   Code Status: DNR   Consults: Palliative care  Family Communication: No family at bedside  Severity of Illness: The appropriate patient status for this patient is INPATIENT. Inpatient status is judged to be reasonable and necessary in order to provide the required intensity of service to ensure the patient's safety. The patient's presenting symptoms, physical exam findings, and initial radiographic and laboratory data in the context of their chronic comorbidities is felt to place them at high risk for further clinical deterioration. Furthermore, it is not anticipated that the patient will be medically stable for discharge from the hospital within 2 midnights of admission.   * I certify that at the point of admission it is my clinical judgment that the patient will require inpatient hospital care spanning beyond 2 midnights from the point of admission due to high intensity of service, high risk for further deterioration and high frequency of surveillance required.*  Author: Rolla Plate, DO 06/18/2022 4:59 AM  For on call review [email or web address].

## 2022-06-18 NOTE — Assessment & Plan Note (Addendum)
-  Baseline 4-5 L nasal cannula - Patient satting in the 70s on 8 L nasal cannula at arrival, now on BiPAP -Blood gas showed a PCO2 of 80 -Midnight blood gas shows a PCO2 of 63 -Repeat blood gas at 5 AM -Patient maintaining oxygen saturations on BiPAP -Patient is DNR/DNI -Chest x-ray today shows volume loss with complete opacification of the left hemithorax--concern for postobstructive pneumonia -Continue broad-spectrum antibiotics -Procalcitonin pending -Continue to monitor

## 2022-06-18 NOTE — Progress Notes (Addendum)
[facility]) Hospital Liaison Note    Mr. Demery is a current hospice patient with a terminal diagnosis of non-small cell lung cancer. Patient went to the ED with complaints of shortness of breath and pain and "feeling hot". Hospital contacted hospice to provide update that the patient was being admitted. Mr. Ligman was admitted to [facility] on 8.21.2023 with diagnosis of sepsis secondary to candidemia. Per Dr. Konrad Dolores,  [facility] MD, this is a related hospital admission.    Visited Mr. Babin bedside. Patient was in bed, sleeping, is on 9L/min via nasal canula, foley present draining dark urine. Report exchanged with hospital team. Nurse reported that patient was on the bipap during the night and is now on oxygen via nasal canula. Spoke with patient's daughter via telephone to provide update and support. Will follow up tomorrow.    Patient is inpatient appropriate due receiving IV antibiotics for treatment of sepsis.    V/S: 112/80, 97.8 Temp, 89 bpm, 15 RR, 100% on 9L/min via [place]  I&O: 2823.9/200  Labs:  pO2, Ven: 50 (H) Acid-Base Excess: 7.1 (H) Bicarbonate: 33.9 (H) Potassium: 3.4 (L) BUN: 21 (H) Calcium: 7.8 (L) Albumin: 1.5 (L) Total Protein: 4.3 (L) WBC: 5.5 RBC: 2.83 (L) Hemoglobin: 8.6 (L) HCT: 28.9 (L) MCV: 102.1 (H) MCHC: 29.8 (L) RDW: 17.3 (H) Platelets: 140 (L) Abs Immature Granulocytes: 0.12 (H)  Diagnostics: none today  IV/PRN:  methylPREDNISolone sodium succinate (SOLU-MEDROL) 125 mg/2 mL injection 125 mg, IV x1  ceFEPIme (MAXIPIME) 2 g in sodium chloride 0.9 % 100 mL IVPB - q8h, IV  fluconazole (DIFLUCAN) IVPB 400 mg - q24hrs, IV   lactated ringers infusion - 119ml/hr continuous IV  metroNIDAZOLE (FLAGYL) IVPB 500 mg - q12hrs, IV x1 vancomycin (VANCOCIN) IVPB 1000 mg/200 mL premix - q12hr, IV x1   ALPRAZolam (XANAX) tablet 0.5 mg - q6hrs, PRN, PO x1  oxyCODONE (Oxy IR/ROXICODONE) immediate release tablet 5 mg - q4hrs, PRN, PO x1  Problem  list: Assessment and Plan: * Sepsis (HCC) - Fever 100.7, tachycardia, tachypnea, with acute respiratory failure -Concern for postobstructive pneumonia -Chest x-ray shows volume loss with complete opacification of the left hemithorax -UA is not indicative of UTI -Blood cultures pending -Urine culture pending -Procalcitonin pending -Patient started on broad-spectrum antibiotics, continue -Continue patient's Diflucan -Consult infectious disease -Continue to monitor   COPD (chronic obstructive pulmonary disease) (HCC) - With pH 7.3, PCO2 of 80 -Continue BiPAP -Continue albuterol -Continue antibiotics -Continue steroid -Continue to monitor   History of PE Continue apixaban   Candidemia (HCC) - Last admission was for sepsis secondary to candidemia -Patient has not yet finished his Diflucan course, which should have finished August 22 -Continue Diflucan -Consult infectious disease -Repeat blood cultures -Continue to monitor   Substance abuse ([place]) - UDS positive for cocaine -Counseled on the importance of cessation   Acute and chronic respiratory failure with hypoxia (HCC) -Baseline 4-5 L nasal cannula - Patient satting in the 70s on 8 L nasal cannula at arrival, now on BiPAP -Blood gas showed a PCO2 of 80 -Midnight blood gas shows a PCO2 of 63 -Repeat blood gas at 5 AM -Patient maintaining oxygen saturations on BiPAP -Patient is DNR/DNI -Chest x-ray today shows volume loss with complete opacification of the left hemithorax--concern for postobstructive pneumonia -Continue broad-spectrum antibiotics -Procalcitonin pending -Continue to monitor   Non-small cell lung cancer (HCC) - Last discharged with comfort measures only and hospice care -Returns saying that he wants to remain DNR, DNI, but wants treatment -  Consult palliative care for goals of care discussion - If he decides to pursue full treatment consider consult with oncology   Hypokalemia Potassium 3.4 but  likely to have diluted given fluids in the ED Daily potassium supplement 20 mEq Daily BMP   D/C planning: Ongoing.   Family: Spoke with patient's daughter via telephone to provide update and support.  IDT: Updated   Medication list and Transfer Summary placed on [facility] Chart.   Should patient need ambulance transfer - Please use [facility]) as they contract this service for our active hospice patients.        Zigmund Gottron, RN [facility] Liaison  [phone]

## 2022-06-18 NOTE — Progress Notes (Signed)
ABG drawn and taken to lab 

## 2022-06-18 NOTE — Assessment & Plan Note (Signed)
Continue apixaban 

## 2022-06-18 NOTE — Assessment & Plan Note (Signed)
-   With pH 7.3, PCO2 of 80 -Continue BiPAP -Continue albuterol -Continue antibiotics -Continue steroid -Continue to monitor

## 2022-06-18 NOTE — Progress Notes (Signed)
Pt transferred from ED7 to ICU 5 without complication on BIPAP

## 2022-06-18 NOTE — Assessment & Plan Note (Signed)
Potassium 3.4 but likely to have diluted given fluids in the ED Daily potassium supplement 20 mEq Daily BMP

## 2022-06-18 NOTE — Progress Notes (Signed)
Tony Sullivan is a 61 y.o. male with medical history significant of non-small cell lung cancer, COPD, depression, GERD, anxiety, hypertension, prediabetes, substance abuse, and more presents the ED with a chief complaint of dyspnea and pain all over.  He is overall a difficult historian, but states that it was too hot in his house and his [facility] was not operating which was the primary reason for his presentation.  He was admitted with concerns for sepsis in the setting of postobstructive pneumonia and was recently discharged on 8/16 with noted candidemia.  He had refused bronchoscopy at that time and was transitioned to home hospice care.  He was started on Diflucan as well as multiple antibiotics and states that he is otherwise feeling well.  He is requiring higher amounts of oxygen compared to his baseline at home and thus appears to have acute on chronic hypoxemic respiratory failure.  Patient seen and evaluated at bedside after admission earlier this morning.  A.m. labs ordered for further follow-up.  Plan to continue cefepime for now with vancomycin and Flagyl discontinued.  Continue Diflucan as previously ordered for 2-week course.  Anticipate discharge back to home with hospice care in the next 24 hours if oxygen can be weaned back to his usual baseline and he is otherwise doing well.  Total care time: 30 minutes.

## 2022-06-18 NOTE — Assessment & Plan Note (Signed)
-   UDS positive for cocaine -Counseled on the importance of cessation

## 2022-06-18 NOTE — Consult Note (Signed)
Consultation Note Date: 06/18/2022   Patient Name: Tony Sullivan  DOB: [birth year]  MRN: [id number]  Age / Sex: 61 y.o., male  PCP: [name], FNP Referring Physician: Rodena Goldmann, DO  Reason for Consultation: Establishing goals of care  HPI/Patient Profile: 61 y.o. male  with past medical history of non-small cell lung cancer, COPD, acute on chronic respiratory failure, anxiety/depression, HTN, GERD, prediabetes, history of substance abuse and more admitted on 06/17/2022 with acute on chronic hypoxic respiratory failure.   Clinical Assessment and Goals of Care: I have reviewed medical records including EPIC notes, labs and imaging, received report from RN, assessed the patient.  Tony Sullivan is lying quietly in bed in a dark room.  He greets me, making and somewhat keeping eye contact.  He appears chronically ill and frail.  He is alert and oriented x3, able to make his basic needs known.  There is no family at bedside at this time.    We meet at bedside to discuss diagnosis prognosis, GOC, EOL wishes, disposition and options.  Tony Sullivan has been seen by the inpatient palliative team multiple times. I introduced Palliative Medicine as specialized medical care for people living with serious illness. It focuses on providing relief from the symptoms and stress of a serious illness. The goal is to improve quality of life for both the patient and the family.  We focused on their current illness.  Overall, Tony Sullivan tells me that he feels that his breathing is better.  We talked about the treatment plan.  The natural disease trajectory and expectations at EOL were discussed.  Advanced directives, concepts specific to code status, artifical feeding and hydration, and rehospitalization were considered and discussed.  Tony Sullivan remains DNR.  He is agreeable to the use of BiPAP as needed.  Hospice Care services  outpatient were explained and offered.  Tony Sullivan is active with [facility] for at home hospice care.  He states he is agreeable to continue this care.  [place] representative notified of hospitalization.  Discussed the importance of continued conversation with family and the medical providers regarding overall plan of care and treatment options, ensuring decisions are within the context of the patient's values and GOCs. Questions and concerns were addressed. The patient was encouraged to call with questions or concerns.  PMT will continue to support holistically.  Conference with attending, bedside nursing staff, [facility] hospice representative, transition of care team related to patient condition, needs, goals of care, disposition.   HCPOA  NEXT OF KIN -daughter, [name].    SUMMARY OF RECOMMENDATIONS   At this point continue to treat the treatable but no CPR or intubation Time for outcomes States his goal is to return home. Continue with treat the treatable hospice care with [facility]   Code Status/Advance Care Planning: DNR  Symptom Management:  Per hospitalist, no additional needs at this time.  Palliative Prophylaxis:  Frequent Pain Assessment  Additional Recommendations (Limitations, Scope, Preferences): No CPR or intubation  Psycho-social/Spiritual:  Desire for further  Chaplaincy support:no Additional Recommendations: Caregiving  Support/Resources and Education on Hospice  Prognosis:  < 3 months, would not be surprising based on chronic illness burden, multiple hospitalization in the last 6 months.  Discharge Planning: Anticipate home with [facility] hospice care      Primary Diagnoses: Present on Admission:  Sepsis ([place])  Candidemia ([place])  COPD (chronic obstructive pulmonary disease) (HCC)  Acute and chronic respiratory failure with hypoxia (HCC)  Non-small cell lung cancer ([place])  Substance abuse ([place])  Hypokalemia   I have reviewed the medical record, interviewed the patient  and family, and examined the patient. The following aspects are pertinent.  Past Medical History:  Diagnosis Date   Anxiety    Arthritis    COPD (chronic obstructive pulmonary disease) ([place]) 2004   diagnosed in 2004, no PFT's to date.  Started on home O2 12/2013, after found to be desatting at PCP's office, and referred to pulmonology.   Depression    GERD (gastroesophageal reflux disease)    High blood pressure    Prediabetes    Seasonal allergies    Substance abuse (HCC)    Tobacco dependence    Social History   Socioeconomic History   Marital status: Single    Spouse name: Not on file   Number of children: 6   Years of education: Not on file   Highest education level: Not on file  Occupational History   Occupation: disabled  Tobacco Use   Smoking status: Some Days    Packs/day: 0.25    Years: 36.00    Total pack years: 9.00    Types: Cigarettes    Last attempt to quit: 10/2021    Years since quitting: 0.6   Smokeless tobacco: Never  Vaping Use   Vaping Use: Never used  Substance and Sexual Activity   Alcohol use: Not Currently    Alcohol/week: 14.0 - 21.0 standard drinks of alcohol    Types: 14 - 21 Standard drinks or equivalent per week    Comment: 2-3 beers/day, with occasional "binges".  No history of withdrawal   Drug use: Yes    Types: Marijuana, Cocaine    Comment: last cocaine February 2023   Sexual activity: Not on file  Other Topics Concern   Not on file  Social History Narrative   Not on file   Social Determinants of Health   Financial Resource Strain: Not on file  Food Insecurity: Not on file  Transportation Needs: Unmet Transportation Needs (04/12/2022)   PRAPARE - Hydrologist (Medical): Not on file    Lack of Transportation (Non-Medical): Yes  Physical Activity: Inactive (04/12/2022)   Exercise Vital Sign    Days of Exercise per Week: 0 days    Minutes of Exercise per Session: 0 min  Stress: Not on file  Social  Connections: Not on file   Family History  Problem Relation Age of Onset   Emphysema Mother    Allergies Mother        "everyone in family"   Asthma Mother        "everyone in family"   Heart disease Mother    Clotting disorder Mother    Cancer Mother    Emphysema Father    Allergies Father    Allergies Son    Seizures Brother    Diabetes Brother    Scheduled Meds:  apixaban  5 mg Oral BID   Chlorhexidine Gluconate Cloth  6 each Topical Daily  dextromethorphan-guaiFENesin  1 tablet Oral BID   feeding supplement  237 mL Oral BID BM   leptospermum manuka honey  1 Application Topical Daily   methylPREDNISolone (SOLU-MEDROL) injection  125 mg Intravenous Daily   mometasone-formoterol  2 puff Inhalation BID   pantoprazole  40 mg Oral BID   potassium chloride  40 mEq Oral Daily   silver sulfADIAZINE   Topical Daily   umeclidinium bromide  1 puff Inhalation Daily   Continuous Infusions:  ceFEPime (MAXIPIME) IV 2 g (06/18/22 0533)   fluconazole Stopped (06/18/22 0235)   lactated ringers 150 mL/hr at 06/18/22 1035   PRN Meds:.acetaminophen **OR** acetaminophen, albuterol, ALPRAZolam, hydrOXYzine, morphine injection, ondansetron **OR** ondansetron (ZOFRAN) IV, oxyCODONE Medications Prior to Admission:  Prior to Admission medications   Medication Sig Start Date End Date Taking? Authorizing Provider  acetaminophen (TYLENOL) 325 MG tablet Take 2 tablets (650 mg total) by mouth every 6 (six) hours as needed for mild pain (or Fever >/= 101). 10/30/21   [name], MD  albuterol (PROVENTIL) (2.5 MG/3ML) 0.083% nebulizer solution Take 3 mLs (2.5 mg total) by nebulization every 4 (four) hours as needed for wheezing or shortness of breath. 05/08/22 05/08/23  [name], MD  albuterol (VENTOLIN HFA) 108 (90 Base) MCG/ACT inhaler Inhale 2 puffs into the lungs every 6 (six) hours as needed for wheezing or shortness of breath. 05/08/22   [name], MD  ALPRAZolam Duanne Moron) 0.5 MG  tablet Take 1 tablet (0.5 mg total) by mouth every 6 (six) hours as needed for anxiety (agitation and comfort.). 06/12/22   Barton Dubois, MD  apixaban (ELIQUIS) 5 MG TABS tablet Take 1 tablet (5 mg total) by mouth 2 (two) times daily. 05/08/22 08/06/22  [name], MD  Budeson-Glycopyrrol-Formoterol (BREZTRI AEROSPHERE) 160-9-4.8 MCG/ACT AERO Inhale 2 puffs into the lungs in the morning and at bedtime. 05/08/22   [name], MD  collagenase (SANTYL) 250 UNIT/GM ointment Apply topically daily. Apply to coccyx and back pressure injuries once daily after cleaning area with normal saline.  Cover with saline dampened gauze and top with silicone foam. 4/94/49   Barton Dubois, MD  dextromethorphan-guaiFENesin [facility] DM) 30-600 MG 12hr tablet Take 1 tablet by mouth 2 (two) times daily. 06/12/22   Barton Dubois, MD  dorzolamide (TRUSOPT) 2 % ophthalmic solution Place 1 drop into the left eye 2 (two) times daily. 12/12/20   [provider]  fluconazole (DIFLUCAN) 200 MG tablet Take 2 tablets (400 mg total) by mouth daily for 5 days. 06/13/22 06/18/22  Barton Dubois, MD  furosemide (LASIX) 20 MG tablet Take 1 tablet (20 mg total) by mouth daily. 05/08/22   [name], MD  hydrOXYzine (ATARAX) 25 MG tablet Take 1 tablet (25 mg total) by mouth 3 (three) times daily as needed for anxiety. 05/08/22   Emokpae, Courage, MD  latanoprost (XALATAN) 0.005 % ophthalmic solution Place 1 drop into both eyes at bedtime. 05/24/19   [provider]  loxapine (LOXITANE) 10 MG capsule Take 1 capsule (10 mg total) by mouth at bedtime. 05/08/22   [name], MD  morphine 20 MG/5ML solution Take 1.3 mLs (5.2 mg total) by mouth every 6 (six) hours as needed for pain (SOB, agitation and distress.). 06/12/22   Barton Dubois, MD  mupirocin ointment (BACTROBAN) 2 % Apply 1 Application topically daily. 06/13/22   Barton Dubois, MD  OXYGEN Inhale 4-5 L into the lungs as directed. 4-5 liters with sleep  and exertion as needed for low oxygen  [provider]  pantoprazole (PROTONIX) 40 MG tablet Take 1 tablet (40 mg total) by mouth 2 (two) times daily. 05/08/22   [name], MD  polyethylene glycol (MIRALAX / GLYCOLAX) 17 g packet Take 17 g by mouth daily. 05/08/22   [name], MD  predniSONE (DELTASONE) 10 MG tablet Take 4 tablets by mouth daily x2 days; then 3 tablets by mouth daily x3 days; then 2 tablet by mouth daily moving forward. 06/12/22   Barton Dubois, MD  senna-docusate (SENOKOT-S) 8.6-50 MG tablet Take 2 tablets by mouth at bedtime. Patient taking differently: Take 2 tablets by mouth at bedtime as needed for mild constipation. 10/30/21   [name], MD  silver sulfADIAZINE (SILVADENE) 1 % cream Apply topically daily. Apply to bilateral lower extremity wounds in 1/8 inch layer after cleaning with normal saline, top with saline moistened gauze, ABD pad and secured with Kerlix roll gauze/paper tape. 06/13/22   Barton Dubois, MD   Allergies  Allergen Reactions   Penicillins Anaphylaxis    Has patient had a PCN reaction causing immediate rash, facial/tongue/throat swelling, SOB or lightheadedness with hypotension: Yes Has patient had a PCN reaction causing severe rash involving mucus membranes or skin necrosis: No Has patient had a PCN reaction that required hospitalization No Has patient had a PCN reaction occurring within the last 10 years: No If all of the above answers are "NO", then may proceed with Cephalosporin use. Cephalosporins ok   Levocetirizine Other (See Comments)    Muscle cramps   Review of Systems  Unable to perform ROS: Acuity of condition    Physical Exam Vitals and nursing note reviewed.  Constitutional:      General: He is not in acute distress.    Appearance: He is ill-appearing.  Cardiovascular:     Rate and Rhythm: Normal rate.  Pulmonary:     Effort: Pulmonary effort is normal.  Skin:    General: Skin is warm and dry.   Neurological:     Mental Status: He is alert and oriented to person, place, and time.  Psychiatric:        Mood and Affect: Mood normal.        Behavior: Behavior normal.     Vital Signs: BP 112/80   Pulse 89   Temp 98.2 F (36.8 C) (Oral)   Resp 14   Ht 5\' 11"  (1.803 m)   Wt 63.9 kg   SpO2 100%   BMI 19.65 kg/m  Pain Scale: 0-10   Pain Score: 4    SpO2: SpO2: 100 % O2 Device:SpO2: 100 % O2 Flow Rate: .O2 Flow Rate (L/min): 7 L/min  IO: Intake/output summary:  Intake/Output Summary (Last 24 hours) at 06/18/2022 1301 Last data filed at 06/18/2022 0900 Gross per 24 hour  Intake 3234.34 ml  Output 200 ml  Net 3034.34 ml    LBM: Last BM Date : 06/17/22 Baseline Weight: Weight: 68 kg Most recent weight: Weight: 63.9 kg     Palliative Assessment/Data:   Flowsheet Rows    Flowsheet Row Most Recent Value  Intake Tab   Referral Department Hospitalist  Unit at Time of Referral Intermediate Care Unit  Palliative Care Primary Diagnosis Pulmonary  Date Notified 06/17/22  Palliative Care Type Return patient Palliative Care  Reason for referral Clarify Goals of Care  Date of Admission 06/17/22  Date first seen by Palliative Care 06/18/22  # of days Palliative referral response time 1 Day(s)  # of days IP prior to Palliative  referral 0  Clinical Assessment   Palliative Performance Scale Score 40%  Pain Max last 24 hours Not able to report  Pain Min Last 24 hours Not able to report  Dyspnea Max Last 24 Hours Not able to report  Dyspnea Min Last 24 hours Not able to report  Psychosocial & Spiritual Assessment   Palliative Care Outcomes        Time In: 0930 Time Out: 1010 Time Total: 40 minutes Greater than 50%  of this time was spent counseling and coordinating care related to the above assessment and plan.  Signed by: Drue Novel, NP   Please contact Palliative Medicine Team phone at [phone] for questions and concerns.  For individual provider: See  Shea Evans

## 2022-06-18 NOTE — Progress Notes (Signed)
Bipap is at bedside if needed.  Patient is currently on 7L [place] and no distress noted at this time.

## 2022-06-19 DIAGNOSIS — J441 Chronic obstructive pulmonary disease with (acute) exacerbation: Secondary | ICD-10-CM

## 2022-06-19 DIAGNOSIS — Z515 Encounter for palliative care: Secondary | ICD-10-CM

## 2022-06-19 DIAGNOSIS — R6520 Severe sepsis without septic shock: Secondary | ICD-10-CM | POA: Diagnosis not present

## 2022-06-19 DIAGNOSIS — Z7189 Other specified counseling: Secondary | ICD-10-CM | POA: Diagnosis not present

## 2022-06-19 DIAGNOSIS — J9600 Acute respiratory failure, unspecified whether with hypoxia or hypercapnia: Secondary | ICD-10-CM | POA: Diagnosis not present

## 2022-06-19 DIAGNOSIS — A419 Sepsis, unspecified organism: Secondary | ICD-10-CM | POA: Diagnosis not present

## 2022-06-19 LAB — BASIC METABOLIC PANEL
Anion gap: 4 — ABNORMAL LOW (ref 5–15)
BUN: 19 mg/dL (ref 6–20)
CO2: 30 mmol/L (ref 22–32)
Calcium: 7.5 mg/dL — ABNORMAL LOW (ref 8.9–10.3)
Chloride: 103 mmol/L (ref 98–111)
Creatinine, Ser: 0.63 mg/dL (ref 0.61–1.24)
GFR, Estimated: >60 mL/min (ref >60)
Glucose, Bld: 161 mg/dL — ABNORMAL HIGH (ref 70–99)
Potassium: 4.4 mmol/L (ref 3.5–5.1)
Sodium: 137 mmol/L (ref 135–145)

## 2022-06-19 LAB — CBC
HCT: 25.2 % — ABNORMAL LOW (ref 39.0–52.0)
Hemoglobin: 7.5 g/dL — ABNORMAL LOW (ref 13.0–17.0)
MCH: 30.1 pg (ref 26.0–34.0)
MCHC: 29.8 g/dL — ABNORMAL LOW (ref 30.0–36.0)
MCV: 101.2 fL — ABNORMAL HIGH (ref 80.0–100.0)
Platelets: 132 10*3/uL — ABNORMAL LOW (ref 150–400)
RBC: 2.49 MIL/uL — ABNORMAL LOW (ref 4.22–5.81)
RDW: 16.9 % — ABNORMAL HIGH (ref 11.5–15.5)
WBC: 6.2 10*3/uL (ref 4.0–10.5)
nRBC: 0 % (ref 0.0–0.2)

## 2022-06-19 LAB — URINE CULTURE: Culture: NO GROWTH

## 2022-06-19 LAB — MAGNESIUM: Magnesium: 2 mg/dL (ref 1.7–2.4)

## 2022-06-19 MED ORDER — CEFDINIR 300 MG PO CAPS: 300.0000 mg | ORAL_CAPSULE | Freq: Two times a day (BID) | ORAL | 0 refills | Status: AC

## 2022-06-19 MED ORDER — ENSURE ENLIVE PO LIQD: 237.0000 mL | Freq: Two times a day (BID) | ORAL | 12 refills | Status: AC

## 2022-06-19 MED ORDER — PREDNISONE 10 MG PO TABS: 40.0000 mg | ORAL_TABLET | Freq: Every day | ORAL | 0 refills | Status: AC

## 2022-06-19 NOTE — Discharge Summary (Signed)
Physician Discharge Summary  Tony Sullivan GHW:[id number] DOB: Sep 24, 1961 DOA: 06/17/2022  PCP: [name], FNP  Admit date: 06/17/2022  Discharge date: 06/19/2022  Admitted From:Home with hospice  Disposition:  Home with hospice  Recommendations for Outpatient Follow-up:  Follow up with hospice agency Continue on medications as noted below Patient has completed course of treatment with fluconazole, complete course of treatment for bacterial pneumonia with Omnicef for 5 more days Continue prednisone as prescribed for 5 more days  Home Health: None  Equipment/Devices: Has an oxygen where 7 L  Discharge Condition:Stable  CODE STATUS: DNR  Diet recommendation: Heart Healthy  Brief/Interim Summary: Tony Sullivan is a 61 y.o. male with medical history significant of non-small cell lung cancer, COPD, depression, GERD, anxiety, hypertension, prediabetes, substance abuse, and more presents the ED with a chief complaint of dyspnea and pain all over.  He is overall a difficult historian, but states that it was too hot in his house and his [facility] was not operating which was the primary reason for his presentation.  He was admitted with concerns for sepsis in the setting of postobstructive pneumonia and was recently discharged on 8/16 with noted candidemia.  He had refused bronchoscopy at that time and was transitioned to home hospice care.  He was started on Diflucan as well as multiple antibiotics and states that he is otherwise feeling well.  He is requiring higher amounts of oxygen compared to his baseline at home and thus appears to have acute on chronic hypoxemic respiratory failure.  He has completed his course of Diflucan and was on cefepime during the course of this admission with improvement noted.  He will discharge on cefdinir for 5 more days to complete course of treatment of what appears to be bacterial pneumonia.  He has weaned but back to his usual baseline level of oxygen and is  stable for discharge back home with home hospice agency.  It appears that he does have AC in his home that is functioning.  No other acute events noted.  Discharge Diagnoses:  Principal Problem:   Sepsis ([place]) Active Problems:   COPD (chronic obstructive pulmonary disease) (HCC)   Hypokalemia   Non-small cell lung cancer ([place])   History of pulmonary embolism   Acute and chronic respiratory failure with hypoxia (HCC)   Substance abuse ([place])   Candidemia ([place])  Principal discharge diagnosis: Acute on chronic hypoxemic respiratory failure secondary to postobstructive pneumonia.  Sepsis ruled out.  Discharge Instructions  Discharge Instructions     Diet - low sodium heart healthy   Complete by: As directed    If the dressing is still on your incision site when you go home, remove it on the third day after your surgery date. Remove dressing if it begins to fall off, or if it is dirty or damaged before the third day.   Complete by: As directed    Increase activity slowly   Complete by: As directed       Allergies as of 06/19/2022       Reactions   Penicillins Anaphylaxis   Has patient had a PCN reaction causing immediate rash, facial/tongue/throat swelling, SOB or lightheadedness with hypotension: Yes Has patient had a PCN reaction causing severe rash involving mucus membranes or skin necrosis: No Has patient had a PCN reaction that required hospitalization No Has patient had a PCN reaction occurring within the last 10 years: No If all of the above answers are "NO", then may proceed with  Cephalosporin use. Cephalosporins ok   Levocetirizine Other (See Comments)   Muscle cramps        Medication List     STOP taking these medications    fluconazole 200 MG tablet Commonly known as: DIFLUCAN       TAKE these medications    acetaminophen 325 MG tablet Commonly known as: TYLENOL Take 2 tablets (650 mg total) by mouth every 6 (six) hours as needed for mild pain (or  Fever >/= 101).   albuterol (2.5 MG/3ML) 0.083% nebulizer solution Commonly known as: PROVENTIL Take 3 mLs (2.5 mg total) by nebulization every 4 (four) hours as needed for wheezing or shortness of breath.   albuterol 108 (90 Base) MCG/ACT inhaler Commonly known as: VENTOLIN HFA Inhale 2 puffs into the lungs every 6 (six) hours as needed for wheezing or shortness of breath.   ALPRAZolam 0.5 MG tablet Commonly known as: XANAX Take 1 tablet (0.5 mg total) by mouth every 6 (six) hours as needed for anxiety (agitation and comfort.).   apixaban 5 MG Tabs tablet Commonly known as: ELIQUIS Take 1 tablet (5 mg total) by mouth 2 (two) times daily.   Breztri Aerosphere 160-9-4.8 MCG/ACT Aero Generic drug: Budeson-Glycopyrrol-Formoterol Inhale 2 puffs into the lungs in the morning and at bedtime.   cefdinir 300 MG capsule Commonly known as: OMNICEF Take 1 capsule (300 mg total) by mouth 2 (two) times daily for 5 days.   collagenase 250 UNIT/GM ointment Commonly known as: SANTYL Apply topically daily. Apply to coccyx and back pressure injuries once daily after cleaning area with normal saline.  Cover with saline dampened gauze and top with silicone foam.   dextromethorphan-guaiFENesin 30-600 MG 12hr tablet Commonly known as: MUCINEX DM Take 1 tablet by mouth 2 (two) times daily.   dorzolamide 2 % ophthalmic solution Commonly known as: TRUSOPT Place 1 drop into the left eye 2 (two) times daily.   feeding supplement Liqd Take 237 mLs by mouth 2 (two) times daily between meals.   furosemide 20 MG tablet Commonly known as: LASIX Take 1 tablet (20 mg total) by mouth daily.   hydrOXYzine 25 MG tablet Commonly known as: ATARAX Take 1 tablet (25 mg total) by mouth 3 (three) times daily as needed for anxiety.   latanoprost 0.005 % ophthalmic solution Commonly known as: XALATAN Place 1 drop into both eyes at bedtime.   loxapine 10 MG capsule Commonly known as: LOXITANE Take 1 capsule  (10 mg total) by mouth at bedtime.   morphine 20 MG/5ML solution Take 1.3 mLs (5.2 mg total) by mouth every 6 (six) hours as needed for pain (SOB, agitation and distress.).   mupirocin ointment 2 % Commonly known as: BACTROBAN Apply 1 Application topically daily.   OXYGEN Inhale 4-5 L into the lungs as directed. 4-5 liters with sleep and exertion as needed for low oxygen   pantoprazole 40 MG tablet Commonly known as: PROTONIX Take 1 tablet (40 mg total) by mouth 2 (two) times daily.   polyethylene glycol 17 g packet Commonly known as: MIRALAX / GLYCOLAX Take 17 g by mouth daily.   predniSONE 10 MG tablet Commonly known as: DELTASONE Take 4 tablets (40 mg total) by mouth daily for 5 days. What changed:  how much to take how to take this when to take this additional instructions   senna-docusate 8.6-50 MG tablet Commonly known as: Senokot-S Take 2 tablets by mouth at bedtime. What changed:  when to take this reasons to take this  silver sulfADIAZINE 1 % cream Commonly known as: SILVADENE Apply topically daily. Apply to bilateral lower extremity wounds in 1/8 inch layer after cleaning with normal saline, top with saline moistened gauze, ABD pad and secured with Kerlix roll gauze/paper tape.               Discharge Care Instructions  (From admission, onward)           Start     Ordered   06/19/22 0000  If the dressing is still on your incision site when you go home, remove it on the third day after your surgery date. Remove dressing if it begins to fall off, or if it is dirty or damaged before the third day.        06/19/22 1048            Allergies  Allergen Reactions   Penicillins Anaphylaxis    Has patient had a PCN reaction causing immediate rash, facial/tongue/throat swelling, SOB or lightheadedness with hypotension: Yes Has patient had a PCN reaction causing severe rash involving mucus membranes or skin necrosis: No Has patient had a PCN  reaction that required hospitalization No Has patient had a PCN reaction occurring within the last 10 years: No If all of the above answers are "NO", then may proceed with Cephalosporin use. Cephalosporins ok   Levocetirizine Other (See Comments)    Muscle cramps    Consultations: None   Procedures/Studies: DG Chest Portable 1 View  Result Date: 06/17/2022 CLINICAL DATA:  Shortness of breath EXAM: PORTABLE CHEST 1 VIEW COMPARISON:  06/11/2022 FINDINGS: Complete opacification of the left hemithorax with volume loss and shift of mediastinal contours to the left. No confluent opacities on the right. Areas of scarring in the right lung. No right effusion. No acute bony abnormality. IMPRESSION: Volume loss with complete opacification of the left hemithorax. Electronically Signed   By: Rolm Baptise M.D.   On: 06/17/2022 20:10   DG CHEST PORT 1 VIEW  Result Date: 06/11/2022 CLINICAL DATA:  Shortness of breath.  Chest pain. EXAM: PORTABLE CHEST 1 VIEW COMPARISON:  06/09/2022 FINDINGS: Diminishing amount of pleural air on the left. Increasing opacification consistent with pleural fluid. Continue mediastinal shift towards the left consistent with volume loss of the left lung. Hyperinflated right lung with scarring in the midportion as seen previously. IMPRESSION: Diminishing amount of pleural air on the left. Continued collapse of the left lung with mediastinal shift towards the left. Electronically Signed   By: Nelson Chimes M.D.   On: 06/11/2022 09:24   DG CHEST PORT 1 VIEW  Result Date: 06/09/2022 CLINICAL DATA:  COPD, chronic respiratory failure EXAM: PORTABLE CHEST 1 VIEW COMPARISON:  None Available. FINDINGS: Subtotal atelectasis of the left lung with loculated left apical hydrothorax. There is significant right to left shift of the cardiac and mediastinal structures secondary to the underlying volume loss. Suspected mucous plugging of the left mainstem bronchus. The right lung remains  hyperinflated with chronic bronchitic changes and emphysema. IMPRESSION: 1. Persistent subtotal atelectasis of the left lung likely due to mucous plugging at the left mainstem bronchus. 2. Hydropneumothorax predominantly in the superior aspect of the pleural space. Electronically Signed   By: Jacqulynn Cadet M.D.   On: 06/09/2022 07:43   [place] CHEST (PLEURAL EFFUSION)  Result Date: 06/07/2022 CLINICAL DATA:  New opacification of left hemithorax. Evaluate for large left pleural effusion. EXAM: CHEST ULTRASOUND COMPARISON:  Chest radiograph 06/07/2022 FINDINGS: Small amount of left pleural fluid is identified. No  significant right pleural fluid identified. IMPRESSION: Small left pleural effusion. Opacification of left hemithorax on the recent chest radiograph is most likely related to volume loss rather than large pleural effusion. Electronically Signed   By: Markus Daft M.D.   On: 06/07/2022 14:21   DG CHEST PORT 1 VIEW  Result Date: 06/07/2022 CLINICAL DATA:  Shortness of breath. EXAM: PORTABLE CHEST 1 VIEW COMPARISON:  June 02, 2022 FINDINGS: There is near complete opacification of the left hemithorax which is new in the interval with shift of the heart mediastinum to the left. There is compensatory expansion of the right lung. The cardiomediastinal silhouette is not well assessed. No pneumothorax on the right. No other acute abnormalities identified. IMPRESSION: 1. Near complete opacification of the left hemithorax, new in the interval. There is also volume loss on the left with shift of the heart and mediastinum into the left chest and compensatory hyperinflation of the right lung. Electronically Signed   By: Dorise Bullion III M.D.   On: 06/07/2022 13:24   ECHOCARDIOGRAM COMPLETE  Result Date: 06/03/2022    ECHOCARDIOGRAM REPORT   Patient Name:   Tony Sullivan Date of Exam: 06/03/2022 Medical Rec #:  [id number]      Height:       71.0 in Accession #:    [id number]     Weight:       131.0 lb Date of  Birth:  1961/03/28       BSA:          1.761 m Patient Age:    25 years       BP:           123/81 mmHg Patient Gender: M              HR:           79 bpm. Exam Location:  [facility] Procedure: 2D Echo, Cardiac Doppler and Color Doppler Indications:    Bacteremia  History:        Patient has prior history of Echocardiogram examinations, most                 recent 02/28/2022. COPD, Signs/Symptoms:Chest Pain and Bacteremia;                 Risk Factors:Hypertension, Dyslipidemia and Current Smoker.                 Pulmonary embolus, Lung CA, ETOH abuse, Bipolar.  Sonographer:    Wenda Low Referring Phys: [place]  1. Left ventricular ejection fraction, by estimation, is 55 to 60%. Left ventricular ejection fraction by 2D MOD biplane is 57.0 %. The left ventricle has normal function. The left ventricle has no regional wall motion abnormalities. Left ventricular diastolic parameters are consistent with Grade I diastolic dysfunction (impaired relaxation).  2. Right ventricular systolic function is normal. The right ventricular size is normal. There is normal pulmonary artery systolic pressure. The estimated right ventricular systolic pressure is 54.2 mmHg.  3. The mitral valve is grossly normal. No evidence of mitral valve regurgitation.  4. The aortic valve is tricuspid. Aortic valve regurgitation is not visualized.  5. The inferior vena cava is normal in size with greater than 50% respiratory variability, suggesting right atrial pressure of 3 mmHg. Comparison(s): Changes from prior study are noted. 02/28/2022: LVEF 60-65%, grade 1 DD. Conclusion(s)/Recommendation(s): No evidence of valvular vegetations on this transthoracic echocardiogram. Consider a transesophageal echocardiogram to exclude infective endocarditis if clinically indicated.  FINDINGS  Left Ventricle: Left ventricular ejection fraction, by estimation, is 55 to 60%. Left ventricular ejection fraction by 2D MOD biplane is 57.0 %.  The left ventricle has normal function. The left ventricle has no regional wall motion abnormalities. The left ventricular internal cavity size was normal in size. There is no left ventricular hypertrophy. Left ventricular diastolic parameters are consistent with Grade I diastolic dysfunction (impaired relaxation). Indeterminate filling pressures. Right Ventricle: The right ventricular size is normal. No increase in right ventricular wall thickness. Right ventricular systolic function is normal. There is normal pulmonary artery systolic pressure. The tricuspid regurgitant velocity is 2.40 m/s, and  with an assumed right atrial pressure of 3 mmHg, the estimated right ventricular systolic pressure is 63.1 mmHg. Left Atrium: Left atrial size was normal in size. Right Atrium: Right atrial size was normal in size. Pericardium: There is no evidence of pericardial effusion. Mitral Valve: The mitral valve is grossly normal. No evidence of mitral valve regurgitation. MV peak gradient, 6.1 mmHg. The mean mitral valve gradient is 2.0 mmHg. Tricuspid Valve: The tricuspid valve is grossly normal. Tricuspid valve regurgitation is trivial. Aortic Valve: The aortic valve is tricuspid. Aortic valve regurgitation is not visualized. Aortic valve mean gradient measures 2.0 mmHg. Aortic valve peak gradient measures 5.8 mmHg. Aortic valve area, by VTI measures 2.49 cm. Pulmonic Valve: The pulmonic valve was normal in structure. Pulmonic valve regurgitation is not visualized. Aorta: The aortic root and ascending aorta are structurally normal, with no evidence of dilitation. Venous: The inferior vena cava is normal in size with greater than 50% respiratory variability, suggesting right atrial pressure of 3 mmHg. IAS/Shunts: No atrial level shunt detected by color flow Doppler.  LEFT VENTRICLE PLAX 2D                        Biplane EF (MOD) LVIDd:         3.70 cm         LV Biplane EF:   Left LVIDs:         2.70 cm                           ventricular LV PW:         1.20 cm                          ejection LV IVS:        1.00 cm                          fraction by LVOT diam:     2.00 cm                          2D MOD LV SV:         52                               biplane is LV SV Index:   29                               57.0 %. LVOT Area:     3.14 cm  Diastology                                LV e' medial:    7.07 cm/s LV Volumes (MOD)               LV E/e' medial:  13.2 LV vol d, MOD    56.8 ml       LV e' lateral:   6.96 cm/s A2C:                           LV E/e' lateral: 13.4 LV vol d, MOD    39.4 ml A4C: LV vol s, MOD    20.7 ml A2C: LV vol s, MOD    17.2 ml A4C: LV SV MOD A2C:   36.1 ml LV SV MOD A4C:   39.4 ml LV SV MOD BP:    27.6 ml RIGHT VENTRICLE RV Basal diam:  2.65 cm RV Mid diam:    2.40 cm RV S prime:     12.00 cm/s TAPSE (M-mode): 2.2 cm LEFT ATRIUM             Index        RIGHT ATRIUM           Index LA diam:        3.10 cm 1.76 cm/m   RA Area:     12.70 cm LA Vol (A2C):   31.0 ml 17.60 ml/m  RA Volume:   27.40 ml  15.56 ml/m LA Vol (A4C):   20.2 ml 11.47 ml/m LA Biplane Vol: 24.1 ml 13.68 ml/m  AORTIC VALVE                    PULMONIC VALVE AV Area (Vmax):    2.88 cm     PV Vmax:       0.99 m/s AV Area (Vmean):   2.94 cm     PV Peak grad:  3.9 mmHg AV Area (VTI):     2.49 cm AV Vmax:           120.00 cm/s AV Vmean:          70.300 cm/s AV VTI:            0.207 m AV Peak Grad:      5.8 mmHg AV Mean Grad:      2.0 mmHg LVOT Vmax:         110.00 cm/s LVOT Vmean:        65.800 cm/s LVOT VTI:          0.164 m LVOT/AV VTI ratio: 0.79  AORTA Ao Root diam: 3.60 cm MITRAL VALVE                TRICUSPID VALVE MV Area (PHT): 2.46 cm     TR Peak grad:   23.0 mmHg MV Area VTI:   2.36 cm     TR Vmax:        240.00 cm/s MV Peak grad:  6.1 mmHg MV Mean grad:  2.0 mmHg     SHUNTS MV Vmax:       1.23 m/s     Systemic VTI:  0.16 m MV Vmean:      63.4 cm/s    Systemic Diam: 2.00 cm MV Decel Time: 308 msec  MV E velocity: 93.40 cm/s MV A velocity: 109.00 cm/s MV E/A  ratio:  0.86 Lyman Bishop MD Electronically signed by Lyman Bishop MD Signature Date/Time: 06/03/2022/11:55:08 AM    Final    DG CHEST PORT 1 VIEW  Result Date: 06/02/2022 CLINICAL DATA:  Shortness of breath. EXAM: PORTABLE CHEST 1 VIEW COMPARISON:  05/31/2022 FINDINGS: Lungs are hyperexpanded. Blunting of the left costophrenic angle may be related to a tiny effusion or scarring. Architectural distortion noted left mid lung. New subtle airspace disease noted left base. Old posterior right rib fracture evident. Telemetry leads overlie the chest. IMPRESSION: Emphysema with new subtle airspace disease left base suggesting pneumonia. Electronically Signed   By: Misty Stanley M.D.   On: 06/02/2022 16:52   CT Head Wo Contrast  Result Date: 05/31/2022 CLINICAL DATA:  Mental status change, found down EXAM: CT HEAD WITHOUT CONTRAST TECHNIQUE: Contiguous axial images were obtained from the base of the skull through the vertex without intravenous contrast. RADIATION DOSE REDUCTION: This exam was performed according to the departmental dose-optimization program which includes automated exposure control, adjustment of the mA and/or kV according to patient size and/or use of iterative reconstruction technique. COMPARISON:  CT head 05/15/2022 FINDINGS: Brain: No intracranial hemorrhage, mass effect, or evidence of acute infarct. No hydrocephalus. No extra-axial fluid collection. Vascular: No hyperdense vessel or unexpected calcification. Skull: No fracture or focal lesion. Sinuses/Orbits: No acute finding. Minimal mucosal thickening in the maxillary sinuses. Other: None. IMPRESSION: No acute intracranial abnormality. Electronically Signed   By: Placido Sou M.D.   On: 05/31/2022 19:14   DG Chest Port 1 View  Result Date: 05/31/2022 CLINICAL DATA:  [place] out of chair, found down EXAM: PORTABLE CHEST 1 VIEW COMPARISON:  05/21/2022 FINDINGS: Three frontal views of  the chest are obtained. Cardiac silhouette is stable. Background emphysema again noted without airspace disease, effusion, or pneumothorax. Stable left suprahilar prominence consistent with known endobronchial malignancy. No acute bony abnormality. IMPRESSION: 1. Stable left suprahilar prominence consistent with known left upper lobe endobronchial malignancy. Please refer to previous CT 05/15/2022. 2. Emphysema.  No acute airspace disease. Electronically Signed   By: Randa Ngo M.D.   On: 05/31/2022 18:37   DG Tibia/Fibula Left  Result Date: 05/31/2022 CLINICAL DATA:  Found down, possible sepsis EXAM: LEFT TIBIA AND FIBULA - 2 VIEW COMPARISON:  None Available. FINDINGS: There is no evidence of fracture or other focal bone lesions. Bandage material about the anterior and lateral lower leg. IMPRESSION: No fracture or dislocation of the left tibia or fibula. Bandage material about the anterior and lateral lower leg. Electronically Signed   By: Delanna Ahmadi M.D.   On: 05/31/2022 18:37   MR THORACIC SPINE WO CONTRAST  Result Date: 05/21/2022 CLINICAL DATA:  Initial evaluation for soft tissue ulceration to back. EXAM: MRI THORACIC AND LUMBAR SPINE WITHOUT CONTRAST TECHNIQUE: Multiplanar and multiecho pulse sequences of the thoracic and lumbar spine were obtained without intravenous contrast. COMPARISON:  Prior MRI from 08/26/2019. FINDINGS: MRI THORACIC SPINE FINDINGS Alignment: Physiologic with preservation of the normal thoracic kyphosis. No significant listhesis. Vertebrae: Susceptibility artifact related to prior ACDF noted at C5-6 on counter sequence. Underlying bone marrow signal intensity within normal limits. No worrisome osseous lesions. Multiple acute to subacute compression fractures are seen as follows; 1. T5 compression fracture with up to 50% height loss and trace 2 mm bony retropulsion. 2. T6 compression fracture with up to 35% height loss and trace 2 mm bony retropulsion. 3. T7 compression  fracture with 35% height loss without bony retropulsion. 4. T8 compression fracture with  up to 50% height loss and 3 mm bony retropulsion. 5. T9 compression fracture with 20% height loss without significant bony retropulsion. 6. T11 compression fracture with mild 35% central height loss without significant bony retropulsion. 7. T12 compression fracture with mild 15% height loss without significant bony retropulsion. Marrow edema involving the left transverse process of T6 likely reflects an acute nondisplaced fracture (a series 18, image 15). Additional chronic T10 compression fracture with mild 30% central height loss without retropulsion noted. No evidence for osteomyelitis discitis within the thoracic spine. Cord:  Normal signal and morphology. Paraspinal and other soft tissues: Mild diffuse edema noted within the subcutaneous fat of the posterior thoracolumbar spine. No loculated collections. Consolidative opacity at the visualized posterior left lung base, which could reflect atelectasis or infiltrate. Disc levels: No significant disc pathology seen within the thoracic spine for age. Trace bony retropulsion about several of the compression fractures as above. No significant spinal stenosis. Foramina remain largely patent. MRI LUMBAR SPINE FINDINGS Segmentation: Transitional features seen about the lumbosacral junction with a partially lumbarized S1 segment. Alignment: Physiologic with preservation of the normal lumbar lordosis. No significant listhesis. Vertebrae: Bone marrow signal intensity within normal limits. No discrete or worrisome osseous lesions. Several acute to early subacute compression fractures are seen as follows; 1. Compression fracture involving the superior endplate of L2 with 41% central height loss with trace 2 mm bony retropulsion. 2. Compression fracture involving the L3 vertebral body with mild 10% height loss without significant bony retropulsion. 3. Acute to subacute on chronic  compression fracture involving the L4 vertebral body with complete central height loss with trace 2 mm bony retropulsion. 4. Compression fracture involving the superior endplate of L5 with mild 20% height loss without bony retropulsion. 5. Compression fracture involving the superior endplate of S1 without significant height loss or bony retropulsion. Conus medullaris and cauda equina: Conus extends to the L1 level. Conus and cauda equina appear normal. Paraspinal and other soft tissues: Soft tissue defect overlying the spinous processes of L1 through L3, compatible with ulceration (series 2, image 6). Diffuse edema within the underlying subcutaneous fat and paraspinous musculature. No discrete or loculated collections. No convincing evidence for osteomyelitis discitis or septic arthritis. Visualized visceral structures within normal limits. Disc levels: L1-2: Negative interspace. Mild facet hypertrophy. No significant stenosis. L2-3: Mild disc bulge with trace bony retropulsion. Mild facet hypertrophy. No significant spinal stenosis. Foramina remain patent. L3-4: Disc bulge with disc desiccation. Superimposed small left foraminal disc protrusion closely approximates the exiting left L3 nerve root (series 5, image 16). Mild facet hypertrophy. No significant spinal stenosis. Foramina remain patent. L4-5: Mild disc bulge with disc desiccation and trace bony retropulsion. Mild facet hypertrophy. No significant spinal stenosis. Moderate right greater than left L4 foraminal narrowing. L5-S1: Mild diffuse disc bulge. Moderate facet hypertrophy with associated small joint effusions. No significant spinal stenosis. Moderate right with mild-to-moderate left L5 foraminal narrowing. S1-2: Rudimentary S1-2 interspace. No disc bulge or focal disc herniation. No stenosis. IMPRESSION: 1. Multiple acute to subacute compression fractures involving the T5, T6, T7, T8, T9, T11, T12, L2, L3, L4, L5, and S1 as detailed above. No more  than trace bony retropulsion at several levels without significant spinal stenosis. 2. Soft tissue ulceration overlying the lower back at the level of the spinous processes of L1 through L3. Diffuse edema throughout the underlying posterior paraspinous soft tissues without loculated or drainable fluid collection. No convincing evidence for osteomyelitis discitis or septic arthritis within the thoracolumbar spine  itself. 3. Mild multilevel lumbar spondylosis without significant spinal stenosis. Moderate bilateral L4 and L5 foraminal narrowing. 4. Patchy consolidative opacity within the posterior left lung base, either atelectasis or infiltrate. Electronically Signed   By: Jeannine Boga M.D.   On: 05/21/2022 20:06   MR LUMBAR SPINE WO CONTRAST  Result Date: 05/21/2022 CLINICAL DATA:  Initial evaluation for soft tissue ulceration to back. EXAM: MRI THORACIC AND LUMBAR SPINE WITHOUT CONTRAST TECHNIQUE: Multiplanar and multiecho pulse sequences of the thoracic and lumbar spine were obtained without intravenous contrast. COMPARISON:  Prior MRI from 08/26/2019. FINDINGS: MRI THORACIC SPINE FINDINGS Alignment: Physiologic with preservation of the normal thoracic kyphosis. No significant listhesis. Vertebrae: Susceptibility artifact related to prior ACDF noted at C5-6 on counter sequence. Underlying bone marrow signal intensity within normal limits. No worrisome osseous lesions. Multiple acute to subacute compression fractures are seen as follows; 1. T5 compression fracture with up to 50% height loss and trace 2 mm bony retropulsion. 2. T6 compression fracture with up to 35% height loss and trace 2 mm bony retropulsion. 3. T7 compression fracture with 35% height loss without bony retropulsion. 4. T8 compression fracture with up to 50% height loss and 3 mm bony retropulsion. 5. T9 compression fracture with 20% height loss without significant bony retropulsion. 6. T11 compression fracture with mild 35% central  height loss without significant bony retropulsion. 7. T12 compression fracture with mild 15% height loss without significant bony retropulsion. Marrow edema involving the left transverse process of T6 likely reflects an acute nondisplaced fracture (a series 18, image 15). Additional chronic T10 compression fracture with mild 30% central height loss without retropulsion noted. No evidence for osteomyelitis discitis within the thoracic spine. Cord:  Normal signal and morphology. Paraspinal and other soft tissues: Mild diffuse edema noted within the subcutaneous fat of the posterior thoracolumbar spine. No loculated collections. Consolidative opacity at the visualized posterior left lung base, which could reflect atelectasis or infiltrate. Disc levels: No significant disc pathology seen within the thoracic spine for age. Trace bony retropulsion about several of the compression fractures as above. No significant spinal stenosis. Foramina remain largely patent. MRI LUMBAR SPINE FINDINGS Segmentation: Transitional features seen about the lumbosacral junction with a partially lumbarized S1 segment. Alignment: Physiologic with preservation of the normal lumbar lordosis. No significant listhesis. Vertebrae: Bone marrow signal intensity within normal limits. No discrete or worrisome osseous lesions. Several acute to early subacute compression fractures are seen as follows; 1. Compression fracture involving the superior endplate of L2 with 82% central height loss with trace 2 mm bony retropulsion. 2. Compression fracture involving the L3 vertebral body with mild 10% height loss without significant bony retropulsion. 3. Acute to subacute on chronic compression fracture involving the L4 vertebral body with complete central height loss with trace 2 mm bony retropulsion. 4. Compression fracture involving the superior endplate of L5 with mild 20% height loss without bony retropulsion. 5. Compression fracture involving the superior  endplate of S1 without significant height loss or bony retropulsion. Conus medullaris and cauda equina: Conus extends to the L1 level. Conus and cauda equina appear normal. Paraspinal and other soft tissues: Soft tissue defect overlying the spinous processes of L1 through L3, compatible with ulceration (series 2, image 6). Diffuse edema within the underlying subcutaneous fat and paraspinous musculature. No discrete or loculated collections. No convincing evidence for osteomyelitis discitis or septic arthritis. Visualized visceral structures within normal limits. Disc levels: L1-2: Negative interspace. Mild facet hypertrophy. No significant stenosis. L2-3: Mild disc bulge  with trace bony retropulsion. Mild facet hypertrophy. No significant spinal stenosis. Foramina remain patent. L3-4: Disc bulge with disc desiccation. Superimposed small left foraminal disc protrusion closely approximates the exiting left L3 nerve root (series 5, image 16). Mild facet hypertrophy. No significant spinal stenosis. Foramina remain patent. L4-5: Mild disc bulge with disc desiccation and trace bony retropulsion. Mild facet hypertrophy. No significant spinal stenosis. Moderate right greater than left L4 foraminal narrowing. L5-S1: Mild diffuse disc bulge. Moderate facet hypertrophy with associated small joint effusions. No significant spinal stenosis. Moderate right with mild-to-moderate left L5 foraminal narrowing. S1-2: Rudimentary S1-2 interspace. No disc bulge or focal disc herniation. No stenosis. IMPRESSION: 1. Multiple acute to subacute compression fractures involving the T5, T6, T7, T8, T9, T11, T12, L2, L3, L4, L5, and S1 as detailed above. No more than trace bony retropulsion at several levels without significant spinal stenosis. 2. Soft tissue ulceration overlying the lower back at the level of the spinous processes of L1 through L3. Diffuse edema throughout the underlying posterior paraspinous soft tissues without loculated or  drainable fluid collection. No convincing evidence for osteomyelitis discitis or septic arthritis within the thoracolumbar spine itself. 3. Mild multilevel lumbar spondylosis without significant spinal stenosis. Moderate bilateral L4 and L5 foraminal narrowing. 4. Patchy consolidative opacity within the posterior left lung base, either atelectasis or infiltrate. Electronically Signed   By: Jeannine Boga M.D.   On: 05/21/2022 20:06   DG Chest Port 1 View  Result Date: 05/21/2022 CLINICAL DATA:  Shortness of breath.  Smoker. EXAM: PORTABLE CHEST 1 VIEW COMPARISON:  05/15/2022 FINDINGS: Normal sized heart. Clear lungs. The lungs remain mildly hyperexpanded. Previously described callus associated with the right posterior 6th rib, compatible with an old, healed fracture. IMPRESSION: No acute abnormality.  Stable mild changes of COPD. Electronically Signed   By: Claudie Revering M.D.   On: 05/21/2022 15:44     Discharge Exam: Vitals:   06/19/22 0816 06/19/22 0900  BP:  132/70  Pulse:    Resp:  18  Temp:    SpO2: 98%    Vitals:   06/19/22 0600 06/19/22 0730 06/19/22 0816 06/19/22 0900  BP: (!) 146/86   132/70  Pulse: 90     Resp: (!) 27   18  Temp:  (!) 97.5 F (36.4 C)    TempSrc:  Oral    SpO2: (!) 88%  98%   Weight:      Height:        General: Pt is alert, awake, not in acute distress Cardiovascular: RRR, S1/S2 +, no rubs, no gallops Respiratory: CTA bilaterally, no wheezing, no rhonchi, 7 L nasal cannula Abdominal: Soft, NT, ND, bowel sounds + Extremities: no edema, no cyanosis    The results of significant diagnostics from this hospitalization (including imaging, microbiology, ancillary and laboratory) are listed below for reference.     Microbiology: Recent Results (from the past 240 hour(s))  Urine Culture     Status: None   Collection Time: 06/17/22  8:16 PM   Specimen: Urine, Clean Catch  Result Value Ref Range Status   Specimen Description   Final    URINE, CLEAN  CATCH Performed at [facility], [address], [place], [place] [address]    Special Requests   Final    NONE Performed at [facility], [address], [place], [place] [address]    Culture   Final    NO GROWTH Performed at [facility], [place],  [place] [address]    Report Status 06/19/2022 FINAL  Final  SARS Coronavirus 2 by RT PCR (hospital order, performed in [facility] hospital lab) *cepheid single result test* Urine, In & Out Cath     Status: None   Collection Time: 06/17/22  8:16 PM   Specimen: Urine, In & Out Cath; Nasal Swab  Result Value Ref Range Status   SARS Coronavirus 2 by RT PCR NEGATIVE NEGATIVE Final    Comment: (NOTE) SARS-CoV-2 target nucleic acids are NOT DETECTED.  The SARS-CoV-2 RNA is generally detectable in upper and lower respiratory specimens during the acute phase of infection. The lowest concentration of SARS-CoV-2 viral copies this assay can detect is 250 copies / mL. A negative result does not preclude SARS-CoV-2 infection and should not be used as the sole basis for treatment or other patient management decisions.  A negative result may occur with improper specimen collection / handling, submission of specimen other than nasopharyngeal swab, presence of viral mutation(s) within the areas targeted by this assay, and inadequate number of viral copies (<250 copies / mL). A negative result must be combined with clinical observations, patient history, and epidemiological information.  Fact Sheet for Patients:   https://www.patel.info/  Fact Sheet for Healthcare Providers: https://hall.com/  This test is not yet approved or  cleared by the [place] FDA and has been authorized for detection and/or diagnosis of SARS-CoV-2 by FDA under an Emergency Use Authorization (EUA).  This EUA will remain in effect (meaning this test can be used) for the duration of the COVID-19 declaration  under Section 564(b)(1) of the Act, 21 U.S.C. section 360bbb-3(b)(1), unless the authorization is terminated or revoked sooner.  Performed at [facility], [address], [place], [place] [address]   Blood culture (routine x 2)     Status: None (Preliminary result)   Collection Time: 06/17/22  9:07 PM   Specimen: BLOOD RIGHT HAND  Result Value Ref Range Status   Specimen Description BLOOD RIGHT HAND  Final   Special Requests   Final    BOTTLES DRAWN AEROBIC AND ANAEROBIC Blood Culture adequate volume   Culture   Final    NO GROWTH 2 DAYS Performed at [facility], [address], [place], [place] [address]    Report Status PENDING  Incomplete  Blood culture (routine x 2)     Status: None (Preliminary result)   Collection Time: 06/17/22  9:08 PM   Specimen: Right Antecubital; Blood  Result Value Ref Range Status   Specimen Description RIGHT ANTECUBITAL  Final   Special Requests   Final    BOTTLES DRAWN AEROBIC AND ANAEROBIC Blood Culture adequate volume   Culture   Final    NO GROWTH 2 DAYS Performed at [facility], [address], [place], [place] [address]    Report Status PENDING  Incomplete  Fungus culture, blood     Status: None (Preliminary result)   Collection Time: 06/17/22 10:42 PM   Specimen: BLOOD RIGHT HAND  Result Value Ref Range Status   Specimen Description BLOOD RIGHT HAND  Final   Special Requests   Final    BOTTLES DRAWN AEROBIC AND ANAEROBIC Blood Culture adequate volume Performed at [facility], [address], [place], [place] [address]    Culture PENDING  Incomplete   Report Status PENDING  Incomplete     Labs: BNP (last 3 results) Recent Labs    04/30/22 1451 05/06/22 0420 06/02/22 1608  BNP 194.0* 66.0 151.0*   Basic  Metabolic Panel: Recent Labs  Lab 06/17/22 2106 06/18/22 0443 06/19/22 0316  NA 139 137 137  K 3.4* 3.4* 4.4  CL 99 102 103  CO2 34* 30 30  GLUCOSE 98 70 161*  BUN 21* 21* 19  CREATININE 0.83 0.69 0.63  CALCIUM 8.1*  7.8* 7.5*  MG  --  2.1 2.0   Liver Function Tests: Recent Labs  Lab 06/17/22 2106 06/18/22 0443  AST 42* 32  ALT 45* 42  ALKPHOS 105 88  BILITOT 0.2* 0.8  PROT 4.9* 4.3*  ALBUMIN 1.7* 1.5*   No results for input(s): "LIPASE", "AMYLASE" in the last 168 hours. No results for input(s): "AMMONIA" in the last 168 hours. CBC: Recent Labs  Lab 06/17/22 2106 06/18/22 0443 06/19/22 0316  WBC 6.7 5.5 6.2  NEUTROABS 5.4 4.4  --   HGB 9.2* 8.6* 7.5*  HCT 30.8* 28.9* 25.2*  MCV 101.7* 102.1* 101.2*  PLT 165 140* 132*   Cardiac Enzymes: No results for input(s): "CKTOTAL", "CKMB", "CKMBINDEX", "TROPONINI" in the last 168 hours. BNP: Invalid input(s): "POCBNP" CBG: No results for input(s): "GLUCAP" in the last 168 hours. D-Dimer No results for input(s): "DDIMER" in the last 72 hours. Hgb A1c No results for input(s): "HGBA1C" in the last 72 hours. Lipid Profile No results for input(s): "CHOL", "HDL", "LDLCALC", "TRIG", "CHOLHDL", "LDLDIRECT" in the last 72 hours. Thyroid function studies No results for input(s): "TSH", "T4TOTAL", "T3FREE", "THYROIDAB" in the last 72 hours.  Invalid input(s): "FREET3" Anemia work up No results for input(s): "VITAMINB12", "FOLATE", "FERRITIN", "TIBC", "IRON", "RETICCTPCT" in the last 72 hours. Urinalysis    Component Value Date/Time   COLORURINE AMBER (A) 06/17/2022 2016   APPEARANCEUR HAZY (A) 06/17/2022 2016   LABSPEC 1.017 06/17/2022 2016   PHURINE 5.0 06/17/2022 2016   GLUCOSEU NEGATIVE 06/17/2022 2016   HGBUR NEGATIVE 06/17/2022 2016   BILIRUBINUR NEGATIVE 06/17/2022 2016   KETONESUR NEGATIVE 06/17/2022 2016   PROTEINUR 30 (A) 06/17/2022 2016   UROBILINOGEN 1.0 06/12/2015 1021   NITRITE NEGATIVE 06/17/2022 2016   LEUKOCYTESUR NEGATIVE 06/17/2022 2016   Sepsis Labs Recent Labs  Lab 06/17/22 2106 06/18/22 0443 06/19/22 0316  WBC 6.7 5.5 6.2   Microbiology Recent Results (from the past 240 hour(s))  Urine Culture     Status:  None   Collection Time: 06/17/22  8:16 PM   Specimen: Urine, Clean Catch  Result Value Ref Range Status   Specimen Description   Final    URINE, CLEAN CATCH Performed at [facility], [address], [place], [place] [address]    Special Requests   Final    NONE Performed at [facility], [address], [place], [place] [address]    Culture   Final    NO GROWTH Performed at [facility], [place] [address] [place], [place] [address]    Report Status 06/19/2022 FINAL  Final  SARS Coronavirus 2 by RT PCR (hospital order, performed in [facility] hospital lab) *cepheid single result test* Urine, In & Out Cath     Status: None   Collection Time: 06/17/22  8:16 PM   Specimen: Urine, In & Out Cath; Nasal Swab  Result Value Ref Range Status   SARS Coronavirus 2 by RT PCR NEGATIVE NEGATIVE Final    Comment: (NOTE) SARS-CoV-2 target nucleic acids are NOT DETECTED.  The SARS-CoV-2 RNA is generally detectable in upper and lower respiratory specimens during the acute phase of infection. The lowest concentration of SARS-CoV-2 viral copies this assay  can detect is 250 copies / mL. A negative result does not preclude SARS-CoV-2 infection and should not be used as the sole basis for treatment or other patient management decisions.  A negative result may occur with improper specimen collection / handling, submission of specimen other than nasopharyngeal swab, presence of viral mutation(s) within the areas targeted by this assay, and inadequate number of viral copies (<250 copies / mL). A negative result must be combined with clinical observations, patient history, and epidemiological information.  Fact Sheet for Patients:   https://www.patel.info/  Fact Sheet for Healthcare Providers: https://hall.com/  This test is not yet approved or  cleared by the [place] FDA and has been authorized for detection and/or diagnosis of SARS-CoV-2 by FDA  under an Emergency Use Authorization (EUA).  This EUA will remain in effect (meaning this test can be used) for the duration of the COVID-19 declaration under Section 564(b)(1) of the Act, 21 U.S.C. section 360bbb-3(b)(1), unless the authorization is terminated or revoked sooner.  Performed at [facility], [address] [place], [place] [address]   Blood culture (routine x 2)     Status: None (Preliminary result)   Collection Time: 06/17/22  9:07 PM   Specimen: BLOOD RIGHT HAND  Result Value Ref Range Status   Specimen Description BLOOD RIGHT HAND  Final   Special Requests   Final    BOTTLES DRAWN AEROBIC AND ANAEROBIC Blood Culture adequate volume   Culture   Final    NO GROWTH 2 DAYS Performed at [facility], [address] [place], [place] [address]    Report Status PENDING  Incomplete  Blood culture (routine x 2)     Status: None (Preliminary result)   Collection Time: 06/17/22  9:08 PM   Specimen: Right Antecubital; Blood  Result Value Ref Range Status   Specimen Description RIGHT ANTECUBITAL  Final   Special Requests   Final    BOTTLES DRAWN AEROBIC AND ANAEROBIC Blood Culture adequate volume   Culture   Final    NO GROWTH 2 DAYS Performed at [facility], [address] [place], [place] [address]    Report Status PENDING  Incomplete  Fungus culture, blood     Status: None (Preliminary result)   Collection Time: 06/17/22 10:42 PM   Specimen: BLOOD RIGHT HAND  Result Value Ref Range Status   Specimen Description BLOOD RIGHT HAND  Final   Special Requests   Final    BOTTLES DRAWN AEROBIC AND ANAEROBIC Blood Culture adequate volume Performed at [facility], [address], [place], [place] [address]    Culture PENDING  Incomplete   Report Status PENDING  Incomplete     Time coordinating discharge: 35 minutes  SIGNED:   Rodena Goldmann, DO [facility] 06/19/2022, 10:50 AM  If 7PM-7AM, please contact night-coverage [email or web address]

## 2022-06-19 NOTE — Discharge Instructions (Signed)
AFTER HOURS/ON CALL NUMBER FOR HOSPICE AGENCY: [phone] Somebody will ALWAYS answer this number if you need anything!!

## 2022-06-19 NOTE — TOC Transition Note (Signed)
Transition of Care [facility]) - CM/SW Discharge Note   Patient Details  Name: Tony Sullivan MRN: [id number] Date of Birth: September 05, 1961  Transition of [facility] CM/SW Contact:  Iona Beard, [place] Phone Number: 06/19/2022, 10:43 AM   Clinical Narrative:    CSW spoke with pts daughter Ms. Logan about pts AC. Per Ms. Logan pt has an [facility] unit and fan in the living room where he stays. He also has a second AC unit in the back room. [name] states pt likes to keep the door open to see outside which causes the living room to get hot. TOC consulted for advanced directives. CSW spoke to Ms. Logan who states that she will speak with her father and then work with [employer] on this. CSW updated [facility] RN Nicholaus Corolla of plan for D/C today. TOC signing off.   Final next level of care: Home w Hospice Care Barriers to Discharge: No Barriers Identified   Patient Goals and CMS Choice Patient states their goals for this hospitalization and ongoing recovery are:: return home CMS Medicare.gov Compare Post Acute Care list provided to:: Patient Choice offered to / list presented to : Patient  Discharge Placement                       Discharge Plan and Services     Post Acute Care Choice: Resumption of Svcs/PTA Provider                               Social Determinants of Health (SDOH) Interventions     Readmission Risk Interventions    06/18/2022    1:25 PM 06/02/2022   11:59 AM 05/06/2022   11:49 AM  Readmission Risk Prevention Plan  Transportation Screening Complete Complete Complete  Medication Review Press photographer) Complete Complete Complete  HRI or Home Care Consult Complete Complete Complete  SW Recovery Care/Counseling Consult Complete Complete Complete  Palliative Care Screening Complete Not Applicable Not [place] Not Applicable Not Applicable Not Applicable

## 2022-06-19 NOTE — Progress Notes (Signed)
Palliative: [name] is lying quietly in bed.  He appears acutely/chronically ill and very frail.  He is alert and oriented, able to make his basic needs known.  There is no family present at bedside at this time.  We talk about the plan to discharge him today.  Overall, he has no questions.  Transition of care team has been working closely with his daughter to make sure his needs are met at home.  Conference with attending, bedside nursing staff, transition of care team related to patient condition, needs, goals of care, disposition.  Plan: Home with the benefits of [facility].  Anticipate rehospitalization.  25 minutes Quinn Axe, NP Palliative medicine team Team phone [phone] Greater than 50% of this time was spent counseling and coordinating care related to the above assessment and plan.

## 2022-06-21 ENCOUNTER — Emergency Department (HOSPITAL_COMMUNITY)

## 2022-06-21 ENCOUNTER — Emergency Department (HOSPITAL_COMMUNITY)
Admission: EM | Admit: 2022-06-21 | Discharge: 2022-06-22 | Disposition: A | Discharge status: Home / Self Care | Source: Home / Self Care | Attending: Emergency Medicine | Admitting: Emergency Medicine

## 2022-06-21 ENCOUNTER — Other Ambulatory Visit: Payer: Self-pay

## 2022-06-21 ENCOUNTER — Encounter (HOSPITAL_COMMUNITY): Payer: Self-pay | Admitting: Emergency Medicine

## 2022-06-21 DIAGNOSIS — Z85118 Personal history of other malignant neoplasm of bronchus and lung: Secondary | ICD-10-CM | POA: Insufficient documentation

## 2022-06-21 DIAGNOSIS — I10 Essential (primary) hypertension: Secondary | ICD-10-CM | POA: Insufficient documentation

## 2022-06-21 DIAGNOSIS — R0602 Shortness of breath: Secondary | ICD-10-CM

## 2022-06-21 DIAGNOSIS — J9610 Chronic respiratory failure, unspecified whether with hypoxia or hypercapnia: Secondary | ICD-10-CM | POA: Diagnosis not present

## 2022-06-21 DIAGNOSIS — J9621 Acute and chronic respiratory failure with hypoxia: Secondary | ICD-10-CM | POA: Diagnosis not present

## 2022-06-21 DIAGNOSIS — Z7901 Long term (current) use of anticoagulants: Secondary | ICD-10-CM | POA: Insufficient documentation

## 2022-06-21 DIAGNOSIS — J9611 Chronic respiratory failure with hypoxia: Secondary | ICD-10-CM | POA: Insufficient documentation

## 2022-06-21 DIAGNOSIS — J449 Chronic obstructive pulmonary disease, unspecified: Secondary | ICD-10-CM

## 2022-06-21 MED ORDER — ALBUTEROL SULFATE HFA 108 (90 BASE) MCG/ACT IN AERS: 2.0000 | INHALATION_SPRAY | RESPIRATORY_TRACT | Status: DC | PRN

## 2022-06-21 NOTE — ED Triage Notes (Signed)
Pt BIB EMS for c/o SOB. Per EMS, pt's O2 sats were 96-98 on 6L [place] and pt c/o non-productive cough.

## 2022-06-22 ENCOUNTER — Encounter (HOSPITAL_COMMUNITY): Payer: Self-pay | Admitting: *Deleted

## 2022-06-22 ENCOUNTER — Inpatient Hospital Stay (HOSPITAL_COMMUNITY)
Admission: EM | Admit: 2022-06-22 | Discharge: 2022-06-24 | DRG: 189 | Disposition: A | Discharge status: Hospice | Attending: Internal Medicine | Admitting: Internal Medicine

## 2022-06-22 ENCOUNTER — Other Ambulatory Visit: Payer: Self-pay

## 2022-06-22 ENCOUNTER — Emergency Department (HOSPITAL_COMMUNITY)

## 2022-06-22 DIAGNOSIS — J441 Chronic obstructive pulmonary disease with (acute) exacerbation: Secondary | ICD-10-CM | POA: Diagnosis not present

## 2022-06-22 DIAGNOSIS — Z809 Family history of malignant neoplasm, unspecified: Secondary | ICD-10-CM

## 2022-06-22 DIAGNOSIS — C7A1 Malignant poorly differentiated neuroendocrine tumors: Secondary | ICD-10-CM

## 2022-06-22 DIAGNOSIS — M1990 Unspecified osteoarthritis, unspecified site: Secondary | ICD-10-CM | POA: Diagnosis present

## 2022-06-22 DIAGNOSIS — F17210 Nicotine dependence, cigarettes, uncomplicated: Secondary | ICD-10-CM | POA: Diagnosis present

## 2022-06-22 DIAGNOSIS — R0902 Hypoxemia: Secondary | ICD-10-CM

## 2022-06-22 DIAGNOSIS — Z66 Do not resuscitate: Secondary | ICD-10-CM | POA: Diagnosis present

## 2022-06-22 DIAGNOSIS — Z833 Family history of diabetes mellitus: Secondary | ICD-10-CM

## 2022-06-22 DIAGNOSIS — Z86711 Personal history of pulmonary embolism: Secondary | ICD-10-CM

## 2022-06-22 DIAGNOSIS — Z888 Allergy status to other drugs, medicaments and biological substances status: Secondary | ICD-10-CM

## 2022-06-22 DIAGNOSIS — C3490 Malignant neoplasm of unspecified part of unspecified bronchus or lung: Secondary | ICD-10-CM

## 2022-06-22 DIAGNOSIS — J9622 Acute and chronic respiratory failure with hypercapnia: Secondary | ICD-10-CM

## 2022-06-22 DIAGNOSIS — J9601 Acute respiratory failure with hypoxia: Secondary | ICD-10-CM

## 2022-06-22 DIAGNOSIS — K219 Gastro-esophageal reflux disease without esophagitis: Secondary | ICD-10-CM | POA: Diagnosis present

## 2022-06-22 DIAGNOSIS — Z602 Problems related to living alone: Secondary | ICD-10-CM | POA: Diagnosis present

## 2022-06-22 DIAGNOSIS — J9811 Atelectasis: Secondary | ICD-10-CM

## 2022-06-22 DIAGNOSIS — Z515 Encounter for palliative care: Secondary | ICD-10-CM

## 2022-06-22 DIAGNOSIS — F419 Anxiety disorder, unspecified: Secondary | ICD-10-CM | POA: Diagnosis present

## 2022-06-22 DIAGNOSIS — F319 Bipolar disorder, unspecified: Secondary | ICD-10-CM

## 2022-06-22 DIAGNOSIS — J9610 Chronic respiratory failure, unspecified whether with hypoxia or hypercapnia: Secondary | ICD-10-CM | POA: Diagnosis present

## 2022-06-22 DIAGNOSIS — Z880 Allergy status to penicillin: Secondary | ICD-10-CM

## 2022-06-22 DIAGNOSIS — Z981 Arthrodesis status: Secondary | ICD-10-CM

## 2022-06-22 DIAGNOSIS — Z9981 Dependence on supplemental oxygen: Secondary | ICD-10-CM

## 2022-06-22 DIAGNOSIS — Z7901 Long term (current) use of anticoagulants: Secondary | ICD-10-CM

## 2022-06-22 DIAGNOSIS — I2699 Other pulmonary embolism without acute cor pulmonale: Secondary | ICD-10-CM | POA: Diagnosis present

## 2022-06-22 DIAGNOSIS — J9612 Chronic respiratory failure with hypercapnia: Secondary | ICD-10-CM

## 2022-06-22 DIAGNOSIS — I10 Essential (primary) hypertension: Secondary | ICD-10-CM | POA: Diagnosis present

## 2022-06-22 DIAGNOSIS — C3402 Malignant neoplasm of left main bronchus: Secondary | ICD-10-CM | POA: Diagnosis present

## 2022-06-22 DIAGNOSIS — Z79899 Other long term (current) drug therapy: Secondary | ICD-10-CM

## 2022-06-22 DIAGNOSIS — R7303 Prediabetes: Secondary | ICD-10-CM | POA: Diagnosis present

## 2022-06-22 DIAGNOSIS — J9621 Acute and chronic respiratory failure with hypoxia: Principal | ICD-10-CM | POA: Diagnosis present

## 2022-06-22 DIAGNOSIS — Z832 Family history of diseases of the blood and blood-forming organs and certain disorders involving the immune mechanism: Secondary | ICD-10-CM

## 2022-06-22 DIAGNOSIS — L89150 Pressure ulcer of sacral region, unstageable: Secondary | ICD-10-CM | POA: Diagnosis present

## 2022-06-22 DIAGNOSIS — I2694 Multiple subsegmental pulmonary emboli without acute cor pulmonale: Secondary | ICD-10-CM

## 2022-06-22 DIAGNOSIS — Z8249 Family history of ischemic heart disease and other diseases of the circulatory system: Secondary | ICD-10-CM

## 2022-06-22 DIAGNOSIS — Z7951 Long term (current) use of inhaled steroids: Secondary | ICD-10-CM

## 2022-06-22 DIAGNOSIS — Z825 Family history of asthma and other chronic lower respiratory diseases: Secondary | ICD-10-CM

## 2022-06-22 LAB — BASIC METABOLIC PANEL
Anion gap: 8 (ref 5–15)
BUN: 10 mg/dL (ref 6–20)
CO2: 31 mmol/L (ref 22–32)
Calcium: 9 mg/dL (ref 8.9–10.3)
Chloride: 101 mmol/L (ref 98–111)
Creatinine, Ser: 0.6 mg/dL — ABNORMAL LOW (ref 0.61–1.24)
GFR, Estimated: >60 mL/min (ref >60)
Glucose, Bld: 74 mg/dL (ref 70–99)
Potassium: 4.3 mmol/L (ref 3.5–5.1)
Sodium: 140 mmol/L (ref 135–145)

## 2022-06-22 LAB — CULTURE, BLOOD (ROUTINE X 2)
Culture: NO GROWTH
Culture: NO GROWTH
Special Requests: ADEQUATE
Special Requests: ADEQUATE

## 2022-06-22 LAB — CBC WITH DIFFERENTIAL/PLATELET
Abs Immature Granulocytes: 0.13 10*3/uL — ABNORMAL HIGH (ref 0.00–0.07)
Basophils Absolute: 0 10*3/uL (ref 0.0–0.1)
Basophils Relative: 0 %
Eosinophils Absolute: 0 10*3/uL (ref 0.0–0.5)
Eosinophils Relative: 0 %
HCT: 34.1 % — ABNORMAL LOW (ref 39.0–52.0)
Hemoglobin: 10.1 g/dL — ABNORMAL LOW (ref 13.0–17.0)
Immature Granulocytes: 2 %
Lymphocytes Relative: 13 %
Lymphs Abs: 1.1 10*3/uL (ref 0.7–4.0)
MCH: 30.1 pg (ref 26.0–34.0)
MCHC: 29.6 g/dL — ABNORMAL LOW (ref 30.0–36.0)
MCV: 101.8 fL — ABNORMAL HIGH (ref 80.0–100.0)
Monocytes Absolute: 0.4 10*3/uL (ref 0.1–1.0)
Monocytes Relative: 4 %
Neutro Abs: 7.2 10*3/uL (ref 1.7–7.7)
Neutrophils Relative %: 81 %
Platelets: 245 10*3/uL (ref 150–400)
RBC: 3.35 MIL/uL — ABNORMAL LOW (ref 4.22–5.81)
RDW: 17.5 % — ABNORMAL HIGH (ref 11.5–15.5)
WBC: 8.9 10*3/uL (ref 4.0–10.5)
nRBC: 0.5 % — ABNORMAL HIGH (ref 0.0–0.2)

## 2022-06-22 LAB — TROPONIN I (HIGH SENSITIVITY)
Troponin I (High Sensitivity): 10 ng/L (ref <18)
Troponin I (High Sensitivity): 10 ng/L (ref <18)

## 2022-06-22 LAB — BLOOD GAS, VENOUS
Acid-Base Excess: 11.5 mmol/L — ABNORMAL HIGH (ref 0.0–2.0)
Bicarbonate: 39.6 mmol/L — ABNORMAL HIGH (ref 20.0–28.0)
Drawn by: 27160
O2 Saturation: 48.5 %
Patient temperature: 37
pCO2, Ven: 67 mmHg — ABNORMAL HIGH (ref 44–60)
pH, Ven: 7.38 (ref 7.25–7.43)
pO2, Ven: <31 mmHg — CL (ref 32–45)

## 2022-06-22 LAB — BRAIN NATRIURETIC PEPTIDE: B Natriuretic Peptide: 237 pg/mL — ABNORMAL HIGH (ref 0.0–100.0)

## 2022-06-22 MED ORDER — LORAZEPAM 2 MG/ML IJ SOLN
1.0000 mg | Freq: Once | INTRAMUSCULAR | Status: AC
Start: 2022-06-22 — End: 2022-06-22
  Administered 2022-06-22: 1 mg via INTRAVENOUS
  Filled 2022-06-22: qty 1

## 2022-06-22 MED ORDER — GUAIFENESIN-DM 100-10 MG/5ML PO SYRP
10.0000 mL | ORAL_SOLUTION | Freq: Once | ORAL | Status: AC
  Administered 2022-06-22: 10 mL via ORAL
  Filled 2022-06-22: qty 10

## 2022-06-22 MED ORDER — BUDESON-GLYCOPYRROL-FORMOTEROL 160-9-4.8 MCG/ACT IN AERO: 2.0000 | INHALATION_SPRAY | Freq: Two times a day (BID) | RESPIRATORY_TRACT | Status: DC

## 2022-06-22 MED ORDER — UMECLIDINIUM BROMIDE 62.5 MCG/ACT IN AEPB
1.0000 | INHALATION_SPRAY | Freq: Every day | RESPIRATORY_TRACT | Status: DC
  Administered 2022-06-23 – 2022-06-24 (×2): 1 via RESPIRATORY_TRACT
  Filled 2022-06-22: qty 7

## 2022-06-22 MED ORDER — IPRATROPIUM-ALBUTEROL 0.5-2.5 (3) MG/3ML IN SOLN
3.0000 mL | Freq: Once | RESPIRATORY_TRACT | Status: AC
  Administered 2022-06-22: 3 mL via RESPIRATORY_TRACT
  Filled 2022-06-22: qty 3

## 2022-06-22 MED ORDER — METHYLPREDNISOLONE SODIUM SUCC 125 MG IJ SOLR
125.0000 mg | Freq: Once | INTRAMUSCULAR | Status: AC
  Administered 2022-06-22: 125 mg via INTRAVENOUS
  Filled 2022-06-22: qty 2

## 2022-06-22 MED ORDER — MORPHINE SULFATE (CONCENTRATE) 10 MG/0.5ML PO SOLN
5.0000 mg | Freq: Four times a day (QID) | ORAL | Status: DC | PRN
  Administered 2022-06-22 – 2022-06-23 (×3): 5 mg via ORAL
  Filled 2022-06-22 (×3): qty 0.5

## 2022-06-22 MED ORDER — MOMETASONE FURO-FORMOTEROL FUM 200-5 MCG/ACT IN AERO
2.0000 | INHALATION_SPRAY | Freq: Two times a day (BID) | RESPIRATORY_TRACT | Status: DC
  Administered 2022-06-22 – 2022-06-24 (×3): 2 via RESPIRATORY_TRACT
  Filled 2022-06-22: qty 8.8

## 2022-06-22 MED ORDER — LORAZEPAM 0.5 MG PO TABS
0.5000 mg | ORAL_TABLET | Freq: Once | ORAL | Status: AC
  Administered 2022-06-22: 0.5 mg via ORAL
  Filled 2022-06-22: qty 1

## 2022-06-22 MED ORDER — ALBUTEROL SULFATE HFA 108 (90 BASE) MCG/ACT IN AERS
2.0000 | INHALATION_SPRAY | Freq: Four times a day (QID) | RESPIRATORY_TRACT | Status: DC | PRN
  Administered 2022-06-22: 2 via RESPIRATORY_TRACT
  Filled 2022-06-22: qty 6.7

## 2022-06-22 NOTE — ED Provider Notes (Signed)
[facility] EMERGENCY DEPARTMENT Provider Note   CSN: [id number] Arrival date & time: 06/21/22  2340     History  Chief Complaint  Patient presents with   Shortness of Breath    Tony Sullivan is a 61 y.o. male.  HPI     This is a 61 year old male with a history of COPD, lung cancer, anxiety, hypertension, substance abuse who presents with shortness of breath.  Patient is now on palliative care and hospice secondary to chronic hypoxic respiratory failure.  He is on 7 L of oxygen at home.  Patient states that he feels like something is stuck.  He describes phlegm that makes him feel very short of breath.  He has not had any recent fevers.  He does have a cough.  Patient lives alone.  He was discharged on 8/23 and is receiving home hospice services.  Chart reviewed.  Patient declined bronchoscopy while in the hospital.  He was treated for postobstructive pneumonia and also noted to have candidemia.  Home Medications Prior to Admission medications   Medication Sig Start Date End Date Taking? Authorizing Provider  acetaminophen (TYLENOL) 325 MG tablet Take 2 tablets (650 mg total) by mouth every 6 (six) hours as needed for mild pain (or Fever >/= 101). 10/30/21   [name], MD  albuterol (PROVENTIL) (2.5 MG/3ML) 0.083% nebulizer solution Take 3 mLs (2.5 mg total) by nebulization every 4 (four) hours as needed for wheezing or shortness of breath. 05/08/22 05/08/23  [name], MD  albuterol (VENTOLIN HFA) 108 (90 Base) MCG/ACT inhaler Inhale 2 puffs into the lungs every 6 (six) hours as needed for wheezing or shortness of breath. 05/08/22   [name], MD  ALPRAZolam Duanne Moron) 0.5 MG tablet Take 1 tablet (0.5 mg total) by mouth every 6 (six) hours as needed for anxiety (agitation and comfort.). 06/12/22   Barton Dubois, MD  apixaban (ELIQUIS) 5 MG TABS tablet Take 1 tablet (5 mg total) by mouth 2 (two) times daily. 05/08/22 08/06/22  [name], MD   Budeson-Glycopyrrol-Formoterol (BREZTRI AEROSPHERE) 160-9-4.8 MCG/ACT AERO Inhale 2 puffs into the lungs in the morning and at bedtime. 05/08/22   [name], MD  cefdinir (OMNICEF) 300 MG capsule Take 1 capsule (300 mg total) by mouth 2 (two) times daily for 5 days. 06/19/22 06/24/22  [name], Pratik D, DO  collagenase (SANTYL) 250 UNIT/GM ointment Apply topically daily. Apply to coccyx and back pressure injuries once daily after cleaning area with normal saline.  Cover with saline dampened gauze and top with silicone foam. 04/12/06   Barton Dubois, MD  dextromethorphan-guaiFENesin [facility] DM) 30-600 MG 12hr tablet Take 1 tablet by mouth 2 (two) times daily. 06/12/22   Barton Dubois, MD  dorzolamide (TRUSOPT) 2 % ophthalmic solution Place 1 drop into the left eye 2 (two) times daily. 12/12/20   [provider]  feeding supplement (ENSURE ENLIVE / ENSURE PLUS) LIQD Take 237 mLs by mouth 2 (two) times daily between meals. 06/19/22   [name], Pratik D, DO  furosemide (LASIX) 20 MG tablet Take 1 tablet (20 mg total) by mouth daily. 05/08/22   [name], MD  hydrOXYzine (ATARAX) 25 MG tablet Take 1 tablet (25 mg total) by mouth 3 (three) times daily as needed for anxiety. 05/08/22   Emokpae, Courage, MD  latanoprost (XALATAN) 0.005 % ophthalmic solution Place 1 drop into both eyes at bedtime. 05/24/19   [provider]  loxapine (LOXITANE) 10 MG capsule Take 1 capsule (10 mg total) by  mouth at bedtime. 05/08/22   [name], MD  morphine 20 MG/5ML solution Take 1.3 mLs (5.2 mg total) by mouth every 6 (six) hours as needed for pain (SOB, agitation and distress.). 06/12/22   Barton Dubois, MD  mupirocin ointment (BACTROBAN) 2 % Apply 1 Application topically daily. 06/13/22   Barton Dubois, MD  OXYGEN Inhale 4-5 L into the lungs as directed. 4-5 liters with sleep and exertion as needed for low oxygen    [provider]  pantoprazole (PROTONIX) 40 MG tablet Take 1 tablet (40 mg  total) by mouth 2 (two) times daily. 05/08/22   [name], MD  polyethylene glycol (MIRALAX / GLYCOLAX) 17 g packet Take 17 g by mouth daily. 05/08/22   [name], MD  predniSONE (DELTASONE) 10 MG tablet Take 4 tablets (40 mg total) by mouth daily for 5 days. 06/19/22 06/24/22  [name], Pratik D, DO  senna-docusate (SENOKOT-S) 8.6-50 MG tablet Take 2 tablets by mouth at bedtime. Patient taking differently: Take 2 tablets by mouth at bedtime as needed for mild constipation. 10/30/21   [name], MD  silver sulfADIAZINE (SILVADENE) 1 % cream Apply topically daily. Apply to bilateral lower extremity wounds in 1/8 inch layer after cleaning with normal saline, top with saline moistened gauze, ABD pad and secured with Kerlix roll gauze/paper tape. 06/13/22   Barton Dubois, MD      Allergies    Penicillins and Levocetirizine    Review of Systems   Review of Systems  Constitutional:  Negative for fever.  Respiratory:  Positive for cough and shortness of breath.   Cardiovascular:  Negative for chest pain.  All other systems reviewed and are negative.   Physical Exam Updated Vital Signs BP 138/87   Pulse 63   Temp (!) 97.5 F (36.4 C) (Oral)   Resp 12   Ht 1.803 m (5\' 11" )   Wt 68 kg   SpO2 100%   BMI 20.91 kg/m  Physical Exam Vitals and nursing note reviewed.  Constitutional:      Appearance: He is well-developed.     Comments: Ill-appearing  HENT:     Head: Normocephalic and atraumatic.  Eyes:     Pupils: Pupils are equal, round, and reactive to light.  Cardiovascular:     Rate and Rhythm: Normal rate and regular rhythm.     Heart sounds: Normal heart sounds. No murmur heard. Pulmonary:     Effort: Pulmonary effort is normal. Tachypnea present. No respiratory distress.     Breath sounds: Examination of the left-upper field reveals decreased breath sounds. Examination of the left-middle field reveals decreased breath sounds. Examination of the left-lower field reveals  decreased breath sounds. Decreased breath sounds present. No wheezing.     Comments: Nasal cannula was placed, slight tachypnea noted, abdominal breathing Abdominal:     General: Bowel sounds are normal.     Palpations: Abdomen is soft.     Tenderness: There is no abdominal tenderness. There is no rebound.     Comments: Extensive abdominal scarring noted  Musculoskeletal:     Cervical back: Neck supple.     Right lower leg: No edema.     Left lower leg: No edema.  Lymphadenopathy:     Cervical: No cervical adenopathy.  Skin:    General: Skin is warm and dry.  Neurological:     Mental Status: He is alert and oriented to person, place, and time.  Psychiatric:        Mood and  Affect: Mood normal.     ED Results / Procedures / Treatments   Labs (all labs ordered are listed, but only abnormal results are displayed) Labs Reviewed - No data to display  EKG EKG Interpretation  Date/Time:  Friday June 21 2022 23:51:59 EDT Ventricular Rate:  81 PR Interval:  146 QRS Duration: 107 QT Interval:  429 QTC Calculation: 498 R Axis:   31 Text Interpretation: Sinus rhythm Anteroseptal infarct, age indeterminate Baseline wander in lead(s) V1 Confirmed by Thayer Jew [phone]) on 06/22/2022 12:43:52 AM  Radiology DG Chest Portable 1 View  Result Date: 06/22/2022 CLINICAL DATA:  Dyspnea EXAM: PORTABLE CHEST 1 VIEW COMPARISON:  06/17/2022 FINDINGS: There is complete collapse of the left lung with marked mediastinal shift to the left and resultant complete opacification of the left hemithorax. Superimposed left pleural fluid is not excluded. The right lung is compensatory early hyperinflated. Scarring within the left mid lung zone is again noted. No pneumothorax or pleural effusion on the right. Pulmonary vascularity is normal. IMPRESSION: Complete collapse of the left lung with marked mediastinal shift to the left. Superimposed left pleural fluid is not excluded. Electronically Signed   By:  Fidela Salisbury M.D.   On: 06/22/2022 00:30    Procedures Procedures    Medications Ordered in ED Medications  albuterol (VENTOLIN HFA) 108 (90 Base) MCG/ACT inhaler 2 puff (has no administration in time range)  LORazepam (ATIVAN) tablet 0.5 mg (0.5 mg Oral Given 06/22/22 0112)    ED Course/ Medical Decision Making/ A&P Clinical Course as of 06/22/22 0516  Sat Jun 22, 2022  0100 Personally reviewed x-ray imaging.  Appears comparable to prior imaging on 8/21.  Patient is DNR.  I reviewed his palliative care notes.  Discussed with him that this is likely ongoing obstructive pathology.  He states "well nothing is going to change that."  He continues to decline any invasive testing or procedures.  He states that he feels much better now that he is coughed up some phlegm.  He lives alone and states that when he gets mucus in his throat, it makes him very anxious about his breathing.  Consult with respiratory therapy to provide him with support for clearing mucus.  He does live alone.  He was given a small dose of Ativan.  Will hold and monitor until closer to the morning. [CH]    Clinical Course User Index [CH] Bridgitt Raggio, Barbette Hair, MD                           Medical Decision Making Amount and/or Complexity of Data Reviewed Radiology: ordered.  Risk Prescription drug management.   This patient presents to the ED for concern of shortness of breath, this involves an extensive number of treatment options, and is a complaint that carries with it a high risk of complications and morbidity.  I considered the following differential and admission for this acute, potentially life threatening condition.  The differential diagnosis includes acute on chronic respiratory failure, known lung cancer, ongoing pneumonia, COPD  MDM:    This is an unfortunate 61 year old male who presents with shortness of breath.  Has frequent presentations for the same.  Currently receiving hospice services and during his  last hospitalization refused bronchoscopy.  He reports that he lives alone and gets mucous plugging which makes him feel short of breath.  He had improvement of the symptoms in the ED after coughing up some mucus.  X-ray shows  near complete collapse of the left lung and opacification.  I have independently reviewed this x-ray which is largely unchanged from his 1 on admission.  Patient declines any further testing including CT scan or intervention given his goals of care.  I requested respiratory come and evaluate the patient and help him with tactics for eliminating mucus.  Additionally, encouraged him to touch base with his hospice resources.  Patient lives alone and gets very anxious when he feels short of breath.  (Labs, imaging, consults)  Labs: I Ordered, and personally interpreted labs.  The pertinent results include: None  Imaging Studies ordered: I ordered imaging studies including x-ray I independently visualized and interpreted imaging. I agree with the radiologist interpretation  Additional history obtained from chart review.  External records from outside source obtained and reviewed including recent admission Cardiac Monitoring: The patient was maintained on a cardiac monitor.  I personally viewed and interpreted the cardiac monitored which showed an underlying rhythm of: Sinus rhythm  Reevaluation: After the interventions noted above, I reevaluated the patient and found that they have :improved  Social Determinants of Health: Lives independently with hospice, DNR  Disposition: Discharge  Co morbidities that complicate the patient evaluation  Past Medical History:  Diagnosis Date   Anxiety    Arthritis    COPD (chronic obstructive pulmonary disease) ([place]) 2004   diagnosed in 2004, no PFT's to date.  Started on home O2 12/2013, after found to be desatting at PCP's office, and referred to pulmonology.   Depression    GERD (gastroesophageal reflux disease)    High blood  pressure    Prediabetes    Seasonal allergies    Substance abuse (HCC)    Tobacco dependence      Medicines Meds ordered this encounter  Medications   albuterol (VENTOLIN HFA) 108 (90 Base) MCG/ACT inhaler 2 puff   LORazepam (ATIVAN) tablet 0.5 mg    I have reviewed the patients home medicines and have made adjustments as needed  Problem List / ED Course: Problem List Items Addressed This Visit   None Visit Diagnoses     Chronic respiratory failure with hypoxia (HCC)    -  Primary   Shortness of breath       Obstructive lung disease ([place])                       Final Clinical Impression(s) / ED Diagnoses Final diagnoses:  Chronic respiratory failure with hypoxia ([place])  Shortness of breath  Obstructive lung disease ([place])    Rx / DC Orders ED Discharge Orders     None         Kaire Stary, Barbette Hair, MD 06/22/22 [phone]

## 2022-06-22 NOTE — Discharge Instructions (Signed)
You were seen today for shortness of breath.  Your x-ray continues to show significant collapse and opacification of the left lung.  Given your goals of care, no further treatment was pursued.  Make sure to engage her hospice services.

## 2022-06-22 NOTE — ED Notes (Signed)
Returned call to daughter at this time with no answer.

## 2022-06-22 NOTE — ED Provider Notes (Signed)
[facility] EMERGENCY DEPARTMENT Provider Note   CSN: [id number] Arrival date & time: 06/22/22  1333     History  Chief Complaint  Patient presents with   Shortness of Breath    Tony Sullivan is a 61 y.o. male.  Patient is a 61 year old male currently seeking pallative care after being diagnosed with lung cancer, COPD, CHF, hypertension, substance abuse, and tobacco use presenting for complaints of shortness of breath.  Patient is normally on to 7 L nasal cannula home oxygen.  He presented to emergency department in acute respiratory distress, tachypneic, tachycardic, hypoxic at 70% on 10 L of his home nasal cannula.  Denies fevers or chills.  Admits to intermittent coughing.  The history is provided by the patient. No language interpreter was used.  Shortness of Breath Associated symptoms: cough   Associated symptoms: no abdominal pain, no chest pain, no ear pain, no fever, no rash, no sore throat and no vomiting        Home Medications Prior to Admission medications   Medication Sig Start Date End Date Taking? Authorizing Provider  acetaminophen (TYLENOL) 325 MG tablet Take 2 tablets (650 mg total) by mouth every 6 (six) hours as needed for mild pain (or Fever >/= 101). 10/30/21   [name], MD  albuterol (PROVENTIL) (2.5 MG/3ML) 0.083% nebulizer solution Take 3 mLs (2.5 mg total) by nebulization every 4 (four) hours as needed for wheezing or shortness of breath. 05/08/22 05/08/23  [name], MD  albuterol (VENTOLIN HFA) 108 (90 Base) MCG/ACT inhaler Inhale 2 puffs into the lungs every 6 (six) hours as needed for wheezing or shortness of breath. 05/08/22   [name], MD  ALPRAZolam Duanne Moron) 0.5 MG tablet Take 1 tablet (0.5 mg total) by mouth every 6 (six) hours as needed for anxiety (agitation and comfort.). 06/12/22   Barton Dubois, MD  apixaban (ELIQUIS) 5 MG TABS tablet Take 1 tablet (5 mg total) by mouth 2 (two) times daily. 05/08/22 08/06/22  [name],  MD  Budeson-Glycopyrrol-Formoterol (BREZTRI AEROSPHERE) 160-9-4.8 MCG/ACT AERO Inhale 2 puffs into the lungs in the morning and at bedtime. 05/08/22   [name], MD  cefdinir (OMNICEF) 300 MG capsule Take 1 capsule (300 mg total) by mouth 2 (two) times daily for 5 days. 06/19/22 06/24/22  [name], Pratik D, DO  collagenase (SANTYL) 250 UNIT/GM ointment Apply topically daily. Apply to coccyx and back pressure injuries once daily after cleaning area with normal saline.  Cover with saline dampened gauze and top with silicone foam. 1/66/06   Barton Dubois, MD  dextromethorphan-guaiFENesin [facility] DM) 30-600 MG 12hr tablet Take 1 tablet by mouth 2 (two) times daily. 06/12/22   Barton Dubois, MD  dorzolamide (TRUSOPT) 2 % ophthalmic solution Place 1 drop into the left eye 2 (two) times daily. 12/12/20   [provider]  feeding supplement (ENSURE ENLIVE / ENSURE PLUS) LIQD Take 237 mLs by mouth 2 (two) times daily between meals. 06/19/22   [name], Pratik D, DO  furosemide (LASIX) 20 MG tablet Take 1 tablet (20 mg total) by mouth daily. 05/08/22   [name], MD  hydrOXYzine (ATARAX) 25 MG tablet Take 1 tablet (25 mg total) by mouth 3 (three) times daily as needed for anxiety. 05/08/22   Emokpae, Courage, MD  latanoprost (XALATAN) 0.005 % ophthalmic solution Place 1 drop into both eyes at bedtime. 05/24/19   [provider]  loxapine (LOXITANE) 10 MG capsule Take 1 capsule (10 mg total) by mouth at bedtime. 05/08/22  [name], MD  morphine 20 MG/5ML solution Take 1.3 mLs (5.2 mg total) by mouth every 6 (six) hours as needed for pain (SOB, agitation and distress.). 06/12/22   Barton Dubois, MD  mupirocin ointment (BACTROBAN) 2 % Apply 1 Application topically daily. 06/13/22   Barton Dubois, MD  OXYGEN Inhale 4-5 L into the lungs as directed. 4-5 liters with sleep and exertion as needed for low oxygen    [provider]  pantoprazole (PROTONIX) 40 MG tablet Take 1 tablet (40  mg total) by mouth 2 (two) times daily. 05/08/22   [name], MD  polyethylene glycol (MIRALAX / GLYCOLAX) 17 g packet Take 17 g by mouth daily. 05/08/22   [name], MD  predniSONE (DELTASONE) 10 MG tablet Take 4 tablets (40 mg total) by mouth daily for 5 days. 06/19/22 06/24/22  [name], Pratik D, DO  senna-docusate (SENOKOT-S) 8.6-50 MG tablet Take 2 tablets by mouth at bedtime. Patient taking differently: Take 2 tablets by mouth at bedtime as needed for mild constipation. 10/30/21   [name], MD  silver sulfADIAZINE (SILVADENE) 1 % cream Apply topically daily. Apply to bilateral lower extremity wounds in 1/8 inch layer after cleaning with normal saline, top with saline moistened gauze, ABD pad and secured with Kerlix roll gauze/paper tape. 06/13/22   Barton Dubois, MD      Allergies    Penicillins and Levocetirizine    Review of Systems   Review of Systems  Constitutional:  Negative for chills and fever.  HENT:  Negative for ear pain and sore throat.   Eyes:  Negative for pain and visual disturbance.  Respiratory:  Positive for cough and shortness of breath.   Cardiovascular:  Negative for chest pain and palpitations.  Gastrointestinal:  Negative for abdominal pain and vomiting.  Genitourinary:  Negative for dysuria and hematuria.  Musculoskeletal:  Negative for arthralgias and back pain.  Skin:  Negative for color change and rash.  Neurological:  Negative for seizures and syncope.  All other systems reviewed and are negative.   Physical Exam Updated Vital Signs BP (!) 122/90   Pulse 99   Temp 98.5 F (36.9 C) (Axillary)   Resp 14   Ht 5\' 11"  (1.803 m)   Wt 68 kg   SpO2 99%   BMI 20.91 kg/m  Physical Exam Vitals and nursing note reviewed.  Constitutional:      General: He is not in acute distress.    Appearance: He is well-developed.  HENT:     Head: Normocephalic and atraumatic.  Eyes:     Conjunctiva/sclera: Conjunctivae normal.  Cardiovascular:      Rate and Rhythm: Normal rate and regular rhythm.     Heart sounds: No murmur heard. Pulmonary:     Effort: Tachypnea, accessory muscle usage and respiratory distress present.     Breath sounds: Decreased air movement present. Examination of the left-upper field reveals decreased breath sounds. Examination of the left-middle field reveals decreased breath sounds. Examination of the left-lower field reveals decreased breath sounds. Decreased breath sounds present.  Abdominal:     Palpations: Abdomen is soft.     Tenderness: There is no abdominal tenderness.  Musculoskeletal:        General: No swelling.     Cervical back: Neck supple.  Skin:    General: Skin is warm and dry.     Capillary Refill: Capillary refill takes less than 2 seconds.  Neurological:     Mental Status: He is alert.  Psychiatric:        Mood and Affect: Mood normal.     ED Results / Procedures / Treatments   Labs (all labs ordered are listed, but only abnormal results are displayed) Labs Reviewed  BLOOD GAS, VENOUS - Abnormal; Notable for the following components:      Result Value   pCO2, Ven 67 (*)    pO2, Ven <31 (*)    Bicarbonate 39.6 (*)    Acid-Base Excess 11.5 (*)    All other components within normal limits  CBC WITH DIFFERENTIAL/PLATELET - Abnormal; Notable for the following components:   RBC 3.35 (*)    Hemoglobin 10.1 (*)    HCT 34.1 (*)    MCV 101.8 (*)    MCHC 29.6 (*)    RDW 17.5 (*)    nRBC 0.5 (*)    Abs Immature Granulocytes 0.13 (*)    All other components within normal limits  BASIC METABOLIC PANEL - Abnormal; Notable for the following components:   Creatinine, Ser 0.60 (*)    All other components within normal limits  BRAIN NATRIURETIC PEPTIDE - Abnormal; Notable for the following components:   B Natriuretic Peptide 237.0 (*)    All other components within normal limits  TROPONIN I (HIGH SENSITIVITY)  TROPONIN I (HIGH SENSITIVITY)    EKG None  Radiology DG Chest Portable 1  View  Result Date: 06/22/2022 CLINICAL DATA:  Shortness of breath EXAM: PORTABLE CHEST 1 VIEW COMPARISON:  June 02, 2022 FINDINGS: Complete collapse of the left lung with volume loss and shift of the heart mediastinum to the left. Hyperexpansion of the right lung identified. Pleural effusion on the left is not excluded. No pneumothorax. Haziness in the lateral right lung is mildly more prominent. The right lung is otherwise clear. No other acute abnormalities. IMPRESSION: 1. Mild haziness in the lateral right lung may be due to patient rotation. Early developing infiltrate not completely excluded but considered less likely. Recommend attention on follow-up. 2. Complete collapse of the left lung as above, a stable finding. Electronically Signed   By: Dorise Bullion III M.D.   On: 06/22/2022 14:30   DG Chest Portable 1 View  Result Date: 06/22/2022 CLINICAL DATA:  Dyspnea EXAM: PORTABLE CHEST 1 VIEW COMPARISON:  06/17/2022 FINDINGS: There is complete collapse of the left lung with marked mediastinal shift to the left and resultant complete opacification of the left hemithorax. Superimposed left pleural fluid is not excluded. The right lung is compensatory early hyperinflated. Scarring within the left mid lung zone is again noted. No pneumothorax or pleural effusion on the right. Pulmonary vascularity is normal. IMPRESSION: Complete collapse of the left lung with marked mediastinal shift to the left. Superimposed left pleural fluid is not excluded. Electronically Signed   By: Fidela Salisbury M.D.   On: 06/22/2022 00:30    Procedures .Critical Care  Performed by: Lianne Cure, DO Authorized by: Lianne Cure, DO   Critical care provider statement:    Critical care time (minutes):  60   Critical care was necessary to treat or prevent imminent or life-threatening deterioration of the following conditions:  Respiratory failure   Critical care was time spent personally by me on the following activities:   Development of treatment plan with patient or surrogate, discussions with consultants, evaluation of patient's response to treatment, examination of patient, ordering and review of laboratory studies, ordering and review of radiographic studies, ordering and performing treatments and interventions, pulse oximetry, re-evaluation of patient's  condition and review of old charts     Medications Ordered in ED Medications  methylPREDNISolone sodium succinate (SOLU-MEDROL) 125 mg/2 mL injection 125 mg (125 mg Intravenous Given 06/22/22 1448)  ipratropium-albuterol (DUONEB) 0.5-2.5 (3) MG/3ML nebulizer solution 3 mL (3 mLs Nebulization Given 06/22/22 1419)  guaiFENesin-dextromethorphan (ROBITUSSIN DM) 100-10 MG/5ML syrup 10 mL (10 mLs Oral Given 06/22/22 1419)    ED Course/ Medical Decision Making/ A&P                           Medical Decision Making Amount and/or Complexity of Data Reviewed Labs: ordered. Radiology: ordered.  Risk OTC drugs. Prescription drug management.   65:60 PM 61 year old male currently seeking pallative care after being diagnosed with lung cancer, COPD, CHF, hypertension, substance abuse, and tobacco use presenting for complaints of shortness of breath.  Patient is alert and oriented x3, acute respiratory distress, tachypneic, tachycardic, and hypoxic.  Patient placed on nonrebreather.  Stable labs with baseline. No signs or symptoms of sepsis.  VBG significant for hypercarbia.  CXR demonstrates significant left sided lung collapse comparable to old exams, likely from lung cancer.   Recommending admission at this time for hypoxia outside of patient's home oxygen capabilities. Pt is DNR.   Patient signed out to oncoming physician Dr. Langston Masker pending admission.         Final Clinical Impression(s) / ED Diagnoses Final diagnoses:  Acute respiratory failure with hypoxia ([place])  Collapse of left lung  Malignant neoplasm of lung, unspecified laterality, unspecified  part of lung [facility])    Rx / DC Orders ED Discharge Orders     None         Lianne Cure, DO 44/96/75 1729

## 2022-06-22 NOTE — ED Notes (Signed)
ED Provider at bedside. 

## 2022-06-22 NOTE — H&P (Signed)
History and Physical    Patient: Tony Sullivan OFB:[id number] DOB: June 06, 1961 DOA: 06/22/2022 DOS: the patient was seen and examined on 06/22/2022 PCP: [name], FNP  Patient coming from: Home  Chief Complaint:  Chief Complaint  Patient presents with   Shortness of Breath   HPI: Tony Sullivan is a 61 y.o. male with medical history significant of COPD with chronic respiratory failure, chronic collapsed left lung secondary to non-small cell lung cancer with recent sepsis and candidemia.  Patient was recently hospitalized and discharged on 8/23 on home hospice.  The patient largely lives alone but has a hospice nurse that comes daily.  The patient presents due to worsening hypoxia with oxygen saturation in the 80s despite increasing his home O2.  Here, his oxygen requirement waxes and wanes where there are times where he needs increased oxygen, including a nonrebreather.  He does not routinely take his oral morphine.  He denies fevers, chills, worsening cough, chest pain.  Review of Systems: As mentioned in the history of present illness. All other systems reviewed and are negative. Past Medical History:  Diagnosis Date   Anxiety    Arthritis    COPD (chronic obstructive pulmonary disease) ([place]) 2004   diagnosed in 2004, no PFT's to date.  Started on home O2 12/2013, after found to be desatting at PCP's office, and referred to pulmonology.   Depression    GERD (gastroesophageal reflux disease)    High blood pressure    Prediabetes    Seasonal allergies    Substance abuse ([place])    Tobacco dependence    Past Surgical History:  Procedure Laterality Date   ANTERIOR CERVICAL DECOMP/DISCECTOMY FUSION N/A 09/06/2019   Procedure: Anterior Cervical Discecectomy Fusion - Cerivcal Five-Cervical Six;  Surgeon: Kary Kos, MD;  Location: [facility];  Service: Neurosurgery;  Laterality: N/A;  Anterior Cervical Discecectomy Fusion - Cerivcal Five-Cervical Six   ANTERIOR CERVICAL DISCECTOMY   09/06/2019   APPENDECTOMY  1980   COLONOSCOPY WITH PROPOFOL N/A 12/10/2016   Procedure: COLONOSCOPY WITH PROPOFOL;  Surgeon: [name], MD;  Location: [facility];  Service: Endoscopy;  Laterality: N/A;   FLEXIBLE BRONCHOSCOPY Left 03/22/2022   Procedure: FLEXIBLE BRONCHOSCOPY;  Surgeon: Rigoberto Noel, MD;  Location: [facility];  Service: Pulmonary;  Laterality: Left;   HIP SURGERY Right    SHOULDER ARTHROSCOPY Right    Social History:  reports that he has been smoking cigarettes. He has a 9.00 pack-year smoking history. He has never used smokeless tobacco. He reports that he does not currently use alcohol after a past usage of about 14.0 - 21.0 standard drinks of alcohol per week. He reports current drug use. Drugs: Marijuana and Cocaine.  Allergies  Allergen Reactions   Penicillins Anaphylaxis    Has patient had a PCN reaction causing immediate rash, facial/tongue/throat swelling, SOB or lightheadedness with hypotension: Yes Has patient had a PCN reaction causing severe rash involving mucus membranes or skin necrosis: No Has patient had a PCN reaction that required hospitalization No Has patient had a PCN reaction occurring within the last 10 years: No If all of the above answers are "NO", then may proceed with Cephalosporin use. Cephalosporins ok   Levocetirizine Other (See Comments)    Muscle cramps    Family History  Problem Relation Age of Onset   Emphysema Mother    Allergies Mother        "everyone in family"   Asthma Mother        "  everyone in family"   Heart disease Mother    Clotting disorder Mother    Cancer Mother    Emphysema Father    Allergies Father    Allergies Son    Seizures Brother    Diabetes Brother     Prior to Admission medications   Medication Sig Start Date End Date Taking? Authorizing Provider  acetaminophen (TYLENOL) 325 MG tablet Take 2 tablets (650 mg total) by mouth every 6 (six) hours as needed for mild pain (or Fever >/= 101). 10/30/21    [name], MD  albuterol (PROVENTIL) (2.5 MG/3ML) 0.083% nebulizer solution Take 3 mLs (2.5 mg total) by nebulization every 4 (four) hours as needed for wheezing or shortness of breath. 05/08/22 05/08/23  [name], MD  albuterol (VENTOLIN HFA) 108 (90 Base) MCG/ACT inhaler Inhale 2 puffs into the lungs every 6 (six) hours as needed for wheezing or shortness of breath. 05/08/22   [name], MD  ALPRAZolam Duanne Moron) 0.5 MG tablet Take 1 tablet (0.5 mg total) by mouth every 6 (six) hours as needed for anxiety (agitation and comfort.). 06/12/22   Barton Dubois, MD  apixaban (ELIQUIS) 5 MG TABS tablet Take 1 tablet (5 mg total) by mouth 2 (two) times daily. 05/08/22 08/06/22  [name], MD  Budeson-Glycopyrrol-Formoterol (BREZTRI AEROSPHERE) 160-9-4.8 MCG/ACT AERO Inhale 2 puffs into the lungs in the morning and at bedtime. 05/08/22   [name], MD  cefdinir (OMNICEF) 300 MG capsule Take 1 capsule (300 mg total) by mouth 2 (two) times daily for 5 days. 06/19/22 06/24/22  [name], Pratik D, DO  collagenase (SANTYL) 250 UNIT/GM ointment Apply topically daily. Apply to coccyx and back pressure injuries once daily after cleaning area with normal saline.  Cover with saline dampened gauze and top with silicone foam. 7/74/12   Barton Dubois, MD  dextromethorphan-guaiFENesin [facility] DM) 30-600 MG 12hr tablet Take 1 tablet by mouth 2 (two) times daily. 06/12/22   Barton Dubois, MD  dorzolamide (TRUSOPT) 2 % ophthalmic solution Place 1 drop into the left eye 2 (two) times daily. 12/12/20   [provider]  feeding supplement (ENSURE ENLIVE / ENSURE PLUS) LIQD Take 237 mLs by mouth 2 (two) times daily between meals. 06/19/22   [name], Pratik D, DO  furosemide (LASIX) 20 MG tablet Take 1 tablet (20 mg total) by mouth daily. 05/08/22   [name], MD  hydrOXYzine (ATARAX) 25 MG tablet Take 1 tablet (25 mg total) by mouth 3 (three) times daily as needed for anxiety. 05/08/22   Emokpae,  Courage, MD  latanoprost (XALATAN) 0.005 % ophthalmic solution Place 1 drop into both eyes at bedtime. 05/24/19   [provider]  loxapine (LOXITANE) 10 MG capsule Take 1 capsule (10 mg total) by mouth at bedtime. 05/08/22   [name], MD  morphine 20 MG/5ML solution Take 1.3 mLs (5.2 mg total) by mouth every 6 (six) hours as needed for pain (SOB, agitation and distress.). 06/12/22   Barton Dubois, MD  mupirocin ointment (BACTROBAN) 2 % Apply 1 Application topically daily. 06/13/22   Barton Dubois, MD  OXYGEN Inhale 4-5 L into the lungs as directed. 4-5 liters with sleep and exertion as needed for low oxygen    [provider]  pantoprazole (PROTONIX) 40 MG tablet Take 1 tablet (40 mg total) by mouth 2 (two) times daily. 05/08/22   [name], MD  polyethylene glycol (MIRALAX / GLYCOLAX) 17 g packet Take 17 g by mouth daily. 05/08/22   Emokpae, Courage,  MD  predniSONE (DELTASONE) 10 MG tablet Take 4 tablets (40 mg total) by mouth daily for 5 days. 06/19/22 06/24/22  [name], Pratik D, DO  senna-docusate (SENOKOT-S) 8.6-50 MG tablet Take 2 tablets by mouth at bedtime. Patient taking differently: Take 2 tablets by mouth at bedtime as needed for mild constipation. 10/30/21   [name], MD  silver sulfADIAZINE (SILVADENE) 1 % cream Apply topically daily. Apply to bilateral lower extremity wounds in 1/8 inch layer after cleaning with normal saline, top with saline moistened gauze, ABD pad and secured with Kerlix roll gauze/paper tape. 06/13/22   Barton Dubois, MD    Physical Exam: Vitals:   06/22/22 1749 06/22/22 1800 06/22/22 1830 06/22/22 1845  BP:  128/85 129/84   Pulse: (!) 103 98 95 93  Resp: _0 Temp:  98.7 F (37.1 C)    TempSrc:  Axillary    SpO2: 93% 90% 94% 95%  Weight:      Height:       General: Middle-age male. Awake and alert and oriented x3. No acute cardiopulmonary distress.  HEENT: Normocephalic atraumatic.  Right and left ears normal in  appearance.  Pupils equal, round, reactive to light. Extraocular muscles are intact. Sclerae anicteric and noninjected.  Moist mucosal membranes. No mucosal lesions.  Neck: Neck supple without lymphadenopathy. No carotid bruits. No masses palpated.  Cardiovascular: Regular rate with normal S1-S2 sounds. No murmurs, rubs, gallops auscultated. No JVD.  Respiratory: Good respiratory effort with no wheezes, rales, rhonchi.  Mild rales in right base.  Absent lung sounds consistent with complete pneumothorax of left side.  No accessory muscle use. Abdomen: Soft, nontender, nondistended. Active bowel sounds. No masses or hepatosplenomegaly  Skin: No rashes, lesions, or ulcerations.  Dry, warm to touch. 2+ dorsalis pedis and radial pulses. Musculoskeletal: No calf or leg pain. All major joints not erythematous nontender.  No upper or lower joint deformation.  Good ROM.  No contractures  Psychiatric: Intact judgment and insight. Pleasant and cooperative. Neurologic: No focal neurological deficits. Strength is 5/5 and symmetric in upper and lower extremities.  Cranial nerves II through XII are grossly intact.  Data Reviewed: Results for orders placed or performed during the hospital encounter of 06/22/22 (from the past 24 hour(s))  Blood gas, venous (at [facility] and [facility], not at [facility])     Status: Abnormal   Collection Time: 06/22/22  2:35 PM  Result Value Ref Range   pH, Ven 7.38 7.25 - 7.43   pCO2, Ven 67 (H) 44 - 60 mmHg   pO2, Ven <31 (LL) 32 - 45 mmHg   Bicarbonate 39.6 (H) 20.0 - 28.0 mmol/L   Acid-Base Excess 11.5 (H) 0.0 - 2.0 mmol/L   O2 Saturation 48.5 %   Patient temperature 37.0    Collection site BLOOD RIGHT HAND    Drawn by [id number]   Troponin I (High Sensitivity)     Status: None   Collection Time: 06/22/22  2:36 PM  Result Value Ref Range   Troponin I (High Sensitivity) 10 <18 ng/L  CBC with Differential     Status: Abnormal   Collection Time: 06/22/22  2:36 PM  Result Value Ref Range   WBC  8.9 4.0 - 10.5 K/uL   RBC 3.35 (L) 4.22 - 5.81 MIL/uL   Hemoglobin 10.1 (L) 13.0 - 17.0 g/dL   HCT 34.1 (L) 39.0 - 52.0 %   MCV 101.8 (H) 80.0 - 100.0 fL   MCH 30.1 26.0 - 34.0  pg   MCHC 29.6 (L) 30.0 - 36.0 g/dL   RDW 17.5 (H) 11.5 - 15.5 %   Platelets 245 150 - 400 K/uL   nRBC 0.5 (H) 0.0 - 0.2 %   Neutrophils Relative % 81 %   Neutro Abs 7.2 1.7 - 7.7 K/uL   Lymphocytes Relative 13 %   Lymphs Abs 1.1 0.7 - 4.0 K/uL   Monocytes Relative 4 %   Monocytes Absolute 0.4 0.1 - 1.0 K/uL   Eosinophils Relative 0 %   Eosinophils Absolute 0.0 0.0 - 0.5 K/uL   Basophils Relative 0 %   Basophils Absolute 0.0 0.0 - 0.1 K/uL   Immature Granulocytes 2 %   Abs Immature Granulocytes 0.13 (H) 0.00 - 0.07 K/uL  Basic metabolic panel     Status: Abnormal   Collection Time: 06/22/22  2:36 PM  Result Value Ref Range   Sodium 140 135 - 145 mmol/L   Potassium 4.3 3.5 - 5.1 mmol/L   Chloride 101 98 - 111 mmol/L   CO2 31 22 - 32 mmol/L   Glucose, Bld 74 70 - 99 mg/dL   BUN 10 6 - 20 mg/dL   Creatinine, Ser 0.60 (L) 0.61 - 1.24 mg/dL   Calcium 9.0 8.9 - 10.3 mg/dL   GFR, Estimated >60 >60 mL/min   Anion gap 8 5 - 15  Brain natriuretic peptide     Status: Abnormal   Collection Time: 06/22/22  2:36 PM  Result Value Ref Range   B Natriuretic Peptide 237.0 (H) 0.0 - 100.0 pg/mL  Troponin I (High Sensitivity)     Status: None   Collection Time: 06/22/22  4:01 PM  Result Value Ref Range   Troponin I (High Sensitivity) 10 <18 ng/L   DG Chest Portable 1 View  Result Date: 06/22/2022 CLINICAL DATA:  Shortness of breath EXAM: PORTABLE CHEST 1 VIEW COMPARISON:  June 02, 2022 FINDINGS: Complete collapse of the left lung with volume loss and shift of the heart mediastinum to the left. Hyperexpansion of the right lung identified. Pleural effusion on the left is not excluded. No pneumothorax. Haziness in the lateral right lung is mildly more prominent. The right lung is otherwise clear. No other acute  abnormalities. IMPRESSION: 1. Mild haziness in the lateral right lung may be due to patient rotation. Early developing infiltrate not completely excluded but considered less likely. Recommend attention on follow-up. 2. Complete collapse of the left lung as above, a stable finding. Electronically Signed   By: Dorise Bullion III M.D.   On: 06/22/2022 14:30   DG Chest Portable 1 View  Result Date: 06/22/2022 CLINICAL DATA:  Dyspnea EXAM: PORTABLE CHEST 1 VIEW COMPARISON:  06/17/2022 FINDINGS: There is complete collapse of the left lung with marked mediastinal shift to the left and resultant complete opacification of the left hemithorax. Superimposed left pleural fluid is not excluded. The right lung is compensatory early hyperinflated. Scarring within the left mid lung zone is again noted. No pneumothorax or pleural effusion on the right. Pulmonary vascularity is normal. IMPRESSION: Complete collapse of the left lung with marked mediastinal shift to the left. Superimposed left pleural fluid is not excluded. Electronically Signed   By: Fidela Salisbury M.D.   On: 06/22/2022 00:30     Assessment and Plan: No notes have been filed under this hospital service. 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 Recommend consistent oral morphine use to aid in decreasing air hunger. The  patient may need inpatient hospice COPD Continue home inhaler use Small cell lung cancer History of PE Hypertension Continue home medication    Advance Care Planning:   Code Status: Prior DNR  Consults: None  Family Communication: None  Severity of Illness: The appropriate patient status for this patient is OBSERVATION. Observation status is judged to be reasonable and necessary in order to provide the required intensity of service to ensure the patient's safety. The patient's presenting symptoms, physical exam findings, and initial radiographic and laboratory data in the context of their medical condition is felt to place them at decreased risk for further clinical deterioration. Furthermore, it is anticipated that the patient will be medically stable for discharge from the hospital within 2 midnights of admission.   Author: Truett Mainland, DO 06/22/2022 7:40 PM  For on call review [email or web address].

## 2022-06-22 NOTE — ED Triage Notes (Signed)
Pt brought in by [facility] from home with c/o SOB. Pt was discharged from the ED earlier today. When asked when the SOB started, he is unable to tell me anything other than he says he felt like this when he was discharged this morning. EMS reports when they arrived he had his oxygen on 10L via [place] with an O2 sat of 87% and had wheezing in RUL and therefore Albuterol 2.5mg  neb given. Pt's O2 sat 77% on the 10L upon arrival to ED.

## 2022-06-22 NOTE — ED Notes (Signed)
Dr. Pearline Cables notified of pt's respiratory distress and abnormal vital signs.

## 2022-06-23 DIAGNOSIS — J9612 Chronic respiratory failure with hypercapnia: Secondary | ICD-10-CM | POA: Diagnosis not present

## 2022-06-23 DIAGNOSIS — F17210 Nicotine dependence, cigarettes, uncomplicated: Secondary | ICD-10-CM | POA: Diagnosis present

## 2022-06-23 DIAGNOSIS — Z825 Family history of asthma and other chronic lower respiratory diseases: Secondary | ICD-10-CM | POA: Diagnosis not present

## 2022-06-23 DIAGNOSIS — J9601 Acute respiratory failure with hypoxia: Secondary | ICD-10-CM | POA: Diagnosis present

## 2022-06-23 DIAGNOSIS — Z515 Encounter for palliative care: Secondary | ICD-10-CM | POA: Diagnosis not present

## 2022-06-23 DIAGNOSIS — Z7901 Long term (current) use of anticoagulants: Secondary | ICD-10-CM | POA: Diagnosis not present

## 2022-06-23 DIAGNOSIS — Z832 Family history of diseases of the blood and blood-forming organs and certain disorders involving the immune mechanism: Secondary | ICD-10-CM | POA: Diagnosis not present

## 2022-06-23 DIAGNOSIS — M1990 Unspecified osteoarthritis, unspecified site: Secondary | ICD-10-CM | POA: Diagnosis present

## 2022-06-23 DIAGNOSIS — I10 Essential (primary) hypertension: Secondary | ICD-10-CM | POA: Diagnosis present

## 2022-06-23 DIAGNOSIS — F419 Anxiety disorder, unspecified: Secondary | ICD-10-CM | POA: Diagnosis present

## 2022-06-23 DIAGNOSIS — Z981 Arthrodesis status: Secondary | ICD-10-CM | POA: Diagnosis not present

## 2022-06-23 DIAGNOSIS — Z66 Do not resuscitate: Secondary | ICD-10-CM | POA: Diagnosis present

## 2022-06-23 DIAGNOSIS — Z888 Allergy status to other drugs, medicaments and biological substances status: Secondary | ICD-10-CM | POA: Diagnosis not present

## 2022-06-23 DIAGNOSIS — Z880 Allergy status to penicillin: Secondary | ICD-10-CM | POA: Diagnosis not present

## 2022-06-23 DIAGNOSIS — Z9981 Dependence on supplemental oxygen: Secondary | ICD-10-CM | POA: Diagnosis not present

## 2022-06-23 DIAGNOSIS — F319 Bipolar disorder, unspecified: Secondary | ICD-10-CM | POA: Diagnosis present

## 2022-06-23 DIAGNOSIS — L89150 Pressure ulcer of sacral region, unstageable: Secondary | ICD-10-CM | POA: Diagnosis present

## 2022-06-23 DIAGNOSIS — K219 Gastro-esophageal reflux disease without esophagitis: Secondary | ICD-10-CM | POA: Diagnosis present

## 2022-06-23 DIAGNOSIS — Z602 Problems related to living alone: Secondary | ICD-10-CM | POA: Diagnosis present

## 2022-06-23 DIAGNOSIS — J9622 Acute and chronic respiratory failure with hypercapnia: Secondary | ICD-10-CM | POA: Diagnosis present

## 2022-06-23 DIAGNOSIS — C3402 Malignant neoplasm of left main bronchus: Secondary | ICD-10-CM | POA: Diagnosis present

## 2022-06-23 DIAGNOSIS — J441 Chronic obstructive pulmonary disease with (acute) exacerbation: Secondary | ICD-10-CM | POA: Diagnosis present

## 2022-06-23 DIAGNOSIS — J9621 Acute and chronic respiratory failure with hypoxia: Secondary | ICD-10-CM | POA: Diagnosis present

## 2022-06-23 DIAGNOSIS — Z79899 Other long term (current) drug therapy: Secondary | ICD-10-CM | POA: Diagnosis not present

## 2022-06-23 DIAGNOSIS — R7303 Prediabetes: Secondary | ICD-10-CM | POA: Diagnosis present

## 2022-06-23 DIAGNOSIS — Z7951 Long term (current) use of inhaled steroids: Secondary | ICD-10-CM | POA: Diagnosis not present

## 2022-06-23 DIAGNOSIS — J9610 Chronic respiratory failure, unspecified whether with hypoxia or hypercapnia: Secondary | ICD-10-CM | POA: Diagnosis present

## 2022-06-23 LAB — GLUCOSE, CAPILLARY
Glucose-Capillary: 87 mg/dL (ref 70–99)
Glucose-Capillary: 110 mg/dL — ABNORMAL HIGH (ref 70–99)
Glucose-Capillary: 221 mg/dL — ABNORMAL HIGH (ref 70–99)

## 2022-06-23 MED ORDER — ALBUTEROL SULFATE HFA 108 (90 BASE) MCG/ACT IN AERS
2.0000 | INHALATION_SPRAY | RESPIRATORY_TRACT | Status: DC | PRN
  Filled 2022-06-23: qty 6.7

## 2022-06-23 MED ORDER — MORPHINE SULFATE 20 MG/5ML PO SOLN: 5.0000 mg | Freq: Four times a day (QID) | ORAL | 0 refills | Status: AC | PRN

## 2022-06-23 MED ORDER — LOXAPINE SUCCINATE 5 MG PO CAPS
10.0000 mg | ORAL_CAPSULE | Freq: Every day | ORAL | Status: DC
  Administered 2022-06-23: 10 mg via ORAL
  Filled 2022-06-23: qty 1
  Filled 2022-06-23 (×2): qty 2
  Filled 2022-06-23: qty 1

## 2022-06-23 MED ORDER — MORPHINE SULFATE (PF) 2 MG/ML IV SOLN
1.0000 mg | INTRAVENOUS | Status: DC | PRN
  Administered 2022-06-23 – 2022-06-24 (×4): 1 mg via INTRAVENOUS
  Filled 2022-06-23 (×4): qty 1

## 2022-06-23 MED ORDER — CEFDINIR 300 MG PO CAPS
300.0000 mg | ORAL_CAPSULE | Freq: Two times a day (BID) | ORAL | Status: DC
  Administered 2022-06-23 – 2022-06-24 (×3): 300 mg via ORAL
  Filled 2022-06-23 (×3): qty 1

## 2022-06-23 MED ORDER — FUROSEMIDE 20 MG PO TABS
20.0000 mg | ORAL_TABLET | Freq: Every day | ORAL | Status: DC
  Administered 2022-06-23 – 2022-06-24 (×2): 20 mg via ORAL
  Filled 2022-06-23 (×2): qty 1

## 2022-06-23 MED ORDER — APIXABAN 5 MG PO TABS
5.0000 mg | ORAL_TABLET | Freq: Two times a day (BID) | ORAL | Status: DC
  Administered 2022-06-23 – 2022-06-24 (×3): 5 mg via ORAL
  Filled 2022-06-23 (×3): qty 1

## 2022-06-23 MED ORDER — ALBUTEROL SULFATE (2.5 MG/3ML) 0.083% IN NEBU: 2.5000 mg | INHALATION_SOLUTION | RESPIRATORY_TRACT | Status: DC | PRN

## 2022-06-23 MED ORDER — MUPIROCIN 2 % EX OINT
1.0000 | TOPICAL_OINTMENT | Freq: Every day | CUTANEOUS | Status: DC
  Administered 2022-06-23 – 2022-06-24 (×2): 1 via TOPICAL
  Filled 2022-06-23: qty 22

## 2022-06-23 MED ORDER — PANTOPRAZOLE SODIUM 40 MG PO TBEC
40.0000 mg | DELAYED_RELEASE_TABLET | Freq: Two times a day (BID) | ORAL | Status: DC
  Administered 2022-06-23 – 2022-06-24 (×3): 40 mg via ORAL
  Filled 2022-06-23 (×3): qty 1

## 2022-06-23 MED ORDER — SILVER SULFADIAZINE 1 % EX CREA
TOPICAL_CREAM | Freq: Every day | CUTANEOUS | Status: DC
Start: 2022-06-23 — End: 2022-06-24
  Filled 2022-06-23: qty 85

## 2022-06-23 MED ORDER — ALPRAZOLAM 0.5 MG PO TABS: 0.5000 mg | ORAL_TABLET | Freq: Four times a day (QID) | ORAL | 0 refills | Status: AC | PRN

## 2022-06-23 MED ORDER — ONDANSETRON HCL 4 MG PO TABS: 4.0000 mg | ORAL_TABLET | Freq: Four times a day (QID) | ORAL | Status: DC | PRN

## 2022-06-23 MED ORDER — DORZOLAMIDE HCL 2 % OP SOLN
1.0000 [drp] | Freq: Two times a day (BID) | OPHTHALMIC | Status: DC
  Administered 2022-06-23 – 2022-06-24 (×3): 1 [drp] via OPHTHALMIC
  Filled 2022-06-23 (×2): qty 10

## 2022-06-23 MED ORDER — ONDANSETRON HCL 4 MG/2ML IJ SOLN: 4.0000 mg | Freq: Four times a day (QID) | INTRAMUSCULAR | Status: DC | PRN

## 2022-06-23 MED ORDER — MEDIHONEY WOUND/BURN DRESSING EX PSTE
1.0000 | PASTE | Freq: Every day | CUTANEOUS | Status: DC
  Administered 2022-06-24: 1 via TOPICAL
  Filled 2022-06-23 (×2): qty 44

## 2022-06-23 MED ORDER — LATANOPROST 0.005 % OP SOLN
1.0000 [drp] | Freq: Every day | OPHTHALMIC | Status: DC
  Administered 2022-06-23: 1 [drp] via OPHTHALMIC
  Filled 2022-06-23: qty 2.5

## 2022-06-23 MED ORDER — ALBUTEROL SULFATE (2.5 MG/3ML) 0.083% IN NEBU: 2.5000 mg | INHALATION_SOLUTION | Freq: Four times a day (QID) | RESPIRATORY_TRACT | Status: DC | PRN

## 2022-06-23 MED ORDER — POLYETHYLENE GLYCOL 3350 17 G PO PACK
17.0000 g | PACK | Freq: Every day | ORAL | Status: DC
  Administered 2022-06-24: 17 g via ORAL
  Filled 2022-06-23: qty 1

## 2022-06-23 MED ORDER — ALPRAZOLAM 0.5 MG PO TABS
0.5000 mg | ORAL_TABLET | Freq: Four times a day (QID) | ORAL | Status: DC | PRN
  Administered 2022-06-23 (×2): 0.5 mg via ORAL
  Filled 2022-06-23 (×3): qty 1

## 2022-06-23 MED ORDER — PREDNISONE 20 MG PO TABS
40.0000 mg | ORAL_TABLET | Freq: Every day | ORAL | Status: AC
  Administered 2022-06-23 – 2022-06-24 (×2): 40 mg via ORAL
  Filled 2022-06-23 (×2): qty 2

## 2022-06-23 MED ORDER — DM-GUAIFENESIN ER 30-600 MG PO TB12
1.0000 | ORAL_TABLET | Freq: Two times a day (BID) | ORAL | Status: DC
Start: 2022-06-23 — End: 2022-06-24
  Administered 2022-06-23 – 2022-06-24 (×3): 1 via ORAL
  Filled 2022-06-23 (×3): qty 1

## 2022-06-23 MED ORDER — ENSURE ENLIVE PO LIQD
237.0000 mL | Freq: Two times a day (BID) | ORAL | Status: DC
Start: 2022-06-23 — End: 2022-06-24
  Administered 2022-06-24: 237 mL via ORAL

## 2022-06-23 MED ORDER — GUAIFENESIN ER 600 MG PO TB12
600.0000 mg | ORAL_TABLET | Freq: Two times a day (BID) | ORAL | Status: DC
  Administered 2022-06-23 – 2022-06-24 (×3): 600 mg via ORAL
  Filled 2022-06-23 (×3): qty 1

## 2022-06-23 MED ORDER — ACETAMINOPHEN 325 MG PO TABS: 650.0000 mg | ORAL_TABLET | Freq: Four times a day (QID) | ORAL | Status: DC | PRN

## 2022-06-23 MED ORDER — HYDROXYZINE HCL 25 MG PO TABS
25.0000 mg | ORAL_TABLET | Freq: Three times a day (TID) | ORAL | Status: DC | PRN
  Administered 2022-06-23: 25 mg via ORAL
  Filled 2022-06-23 (×2): qty 1

## 2022-06-23 MED ORDER — SENNOSIDES-DOCUSATE SODIUM 8.6-50 MG PO TABS: 2.0000 | ORAL_TABLET | Freq: Every evening | ORAL | Status: DC | PRN

## 2022-06-23 NOTE — Progress Notes (Signed)
Tried again to bath and change dressings, daughter Reuel Derby told him it was necessary, but still refused. Apprehensive as he accused this nurse and another on ICU of "treating  him like Angelena Sole".

## 2022-06-23 NOTE — Progress Notes (Signed)
PROGRESS NOTE    Tony Sullivan  TIW:[id number] DOB: 1961/10/05 DOA: 06/22/2022 PCP: [name], FNP   Brief Narrative:    Tony Sullivan is a 61 y.o. male with medical history significant of COPD with chronic respiratory failure, chronic collapsed left lung secondary to non-small cell lung cancer with recent sepsis and candidemia.  Patient was recently hospitalized and discharged on 8/23 on home hospice.  The patient largely lives alone but has a hospice nurse that comes daily.  The patient presents due to worsening hypoxia with oxygen saturation in the 80s despite increasing his home O2.  He has been admitted with acute on chronic hypoxemic respiratory failure in the setting of worsening air hunger and need for more aggressive palliative care.  He is agreeable to residential hospice facility.  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 Recommend consistent oral morphine use to aid in decreasing air hunger. The patient may need inpatient hospice, consult to palliative care and patient is agreeable to this plan COPD Continue home inhaler use Small cell lung cancer History of PE Hypertension Continue home medication   DVT prophylaxis: Eliquis Code Status: DNR Family Communication: Tried calling daughter with no response  8/27 Disposition Plan:  Status is: Observation The patient will require care spanning > 2 midnights and should be moved to inpatient because: Need for IV medications.  Consultants:  Palliative  Procedures:  None  Antimicrobials:  None   Subjective: Patient seen and evaluated today with ongoing dyspnea and pain noted.  Objective: Vitals:   06/23/22 0002 06/23/22 0407 06/23/22 0534 06/23/22 0821  BP: 128/83 (!) 130/96 125/89 121/86  Pulse: 96 92 92 93  Resp: 16 16 (!) 22 20  Temp: 98.3 F (36.8 C) 97.7 F (36.5 C) 98.1 F (36.7 C) (!) 97.5 F (36.4 C)  TempSrc: Oral Oral Oral Oral  SpO2: 91% 94% 98% 99%  Weight: 63.5 kg     Height:        Intake/Output Summary (Last 24 hours) at 06/23/2022 1145 Last data filed at 06/23/2022 0529 Gross per 24 hour  Intake --  Output 1500 ml  Net -1500 ml   Filed Weights   06/22/22 1349 06/23/22 0002  Weight: 68 kg 63.5 kg    Examination:  General exam: Appears calm and comfortable  Respiratory system: Clear to auscultation. Respiratory effort normal.  6 L nasal cannula oxygen Cardiovascular system: S1 & S2 heard, RRR.  Gastrointestinal system: Abdomen is soft Central nervous system: Alert and awake Extremities: No edema Skin: No significant lesions noted Psychiatry: Flat affect.    Data Reviewed: I have personally reviewed following labs and imaging studies  CBC: Recent Labs  Lab 06/17/22 2106 06/18/22 0443 06/19/22 0316 06/22/22 1436  WBC 6.7 5.5 6.2 8.9  NEUTROABS 5.4 4.4  --  7.2  HGB 9.2* 8.6* 7.5* 10.1*  HCT 30.8*  28.9* 25.2* 34.1*  MCV 101.7* 102.1* 101.2* 101.8*  PLT 165 140* 132* 211   Basic Metabolic Panel: Recent Labs  Lab 06/17/22 2106 06/18/22 0443 06/19/22 0316 06/22/22 1436  NA 139 137 137 140  K 3.4* 3.4* 4.4 4.3  CL 99 102 103 101  CO2 34* 30 30 31   GLUCOSE 98 70 161* 74  BUN 21* 21* 19 10  CREATININE 0.83 0.69 0.63 0.60*  CALCIUM 8.1* 7.8* 7.5* 9.0  MG  --  2.1 2.0  --     GFR: Estimated Creatinine Clearance: 89.3 mL/min (A) (by C-G formula based on SCr of 0.6 mg/dL (L)). Liver Function Tests: Recent Labs  Lab 06/17/22 2106 06/18/22 0443  AST 42* 32  ALT 45* 42  ALKPHOS 105 88  BILITOT 0.2* 0.8  PROT 4.9* 4.3*  ALBUMIN 1.7* 1.5*   No results for input(s): "LIPASE", "AMYLASE" in the last 168 hours. No results for input(s): "AMMONIA" in the last 168 hours. Coagulation Profile: Recent Labs  Lab 06/18/22 0443  INR 1.0   Cardiac Enzymes: No results for input(s): "CKTOTAL", "CKMB", "CKMBINDEX", "TROPONINI" in the last 168 hours. BNP (last 3 results) No results for input(s): "PROBNP" in the last 8760 hours. HbA1C: No results for input(s): "HGBA1C" in the last 72 hours. CBG: Recent Labs  Lab 06/23/22 0804  GLUCAP 87   Lipid Profile: No results for input(s): "CHOL", "HDL", "LDLCALC", "TRIG", "CHOLHDL", "LDLDIRECT" in the last 72 hours. Thyroid Function Tests: No results for input(s): "TSH", "T4TOTAL", "FREET4", "T3FREE", "THYROIDAB" in the last 72 hours. Anemia Panel: No results for input(s): "VITAMINB12", "FOLATE", "FERRITIN", "TIBC", "IRON", "RETICCTPCT" in the last 72 hours. Sepsis Labs: Recent Labs  Lab 06/17/22 2106 06/17/22 2242 06/18/22 0443  PROCALCITON  --   --  1.34  LATICACIDVEN 1.0 1.0  --     Recent Results (from the past 240 hour(s))  Urine Culture     Status: None   Collection Time: 06/17/22  8:16 PM   Specimen: Urine, Clean Catch  Result Value Ref Range Status   Specimen Description   Final    URINE, CLEAN CATCH Performed at [facility], [address] [place], [place] [address]    Special Requests   Final    NONE Performed at [facility], [address], [place], [place] [address]    Culture   Final    NO GROWTH Performed at [facility], [place] [address], [place], [place] [address]    Report Status 06/19/2022 FINAL  Final  SARS Coronavirus 2 by RT PCR (hospital order, performed in [facility]  hospital lab) *cepheid single result test* Urine, In & Out Cath     Status: None   Collection Time: 06/17/22  8:16 PM   Specimen: Urine, In & Out Cath; Nasal Swab  Result Value Ref Range Status   SARS Coronavirus 2 by RT PCR NEGATIVE NEGATIVE Final    Comment: (NOTE) SARS-CoV-2 target nucleic acids are NOT DETECTED.  The SARS-CoV-2 RNA is generally detectable in upper and lower respiratory specimens during the acute phase of infection. The lowest concentration of SARS-CoV-2 viral copies this assay can detect is 250 copies / mL. A negative result does not preclude SARS-CoV-2 infection and should not be used as the sole basis for treatment or other patient management decisions.  A negative result may occur with improper specimen collection / handling, submission of specimen other than nasopharyngeal swab, presence of viral mutation(s) within the areas targeted by this assay, and  inadequate number of viral copies (<250 copies / mL). A negative result must be combined with clinical observations, patient history, and epidemiological information.  Fact Sheet for Patients:   https://www.patel.info/  Fact Sheet for Healthcare Providers: https://hall.com/  This test is not yet approved or  cleared by the [place] FDA and has been authorized for detection and/or diagnosis of SARS-CoV-2 by FDA under an Emergency Use Authorization (EUA).  This EUA will remain in effect (meaning this test can be used) for the duration of the COVID-19 declaration under Section 564(b)(1) of the Act, 21 U.S.C. section 360bbb-3(b)(1), unless the authorization is terminated or revoked sooner.  Performed at [facility], [address], [place], [place] [address]   Blood culture (routine x 2)     Status: None   Collection Time: 06/17/22  9:07 PM   Specimen: BLOOD RIGHT HAND  Result Value Ref Range Status   Specimen Description BLOOD RIGHT HAND  Final   Special  Requests   Final    BOTTLES DRAWN AEROBIC AND ANAEROBIC Blood Culture adequate volume   Culture   Final    NO GROWTH 5 DAYS Performed at [facility], [address], [place], [place] [address]    Report Status 06/22/2022 FINAL  Final  Blood culture (routine x 2)     Status: None   Collection Time: 06/17/22  9:08 PM   Specimen: Right Antecubital; Blood  Result Value Ref Range Status   Specimen Description RIGHT ANTECUBITAL  Final   Special Requests   Final    BOTTLES DRAWN AEROBIC AND ANAEROBIC Blood Culture adequate volume   Culture   Final    NO GROWTH 5 DAYS Performed at [facility], [address], [place], [place] [address]    Report Status 06/22/2022 FINAL  Final  Fungus culture, blood     Status: None (Preliminary result)   Collection Time: 06/17/22 10:42 PM   Specimen: BLOOD RIGHT HAND  Result Value Ref Range Status   Specimen Description BLOOD RIGHT HAND  Final   Special Requests   Final    BOTTLES DRAWN AEROBIC AND ANAEROBIC Blood Culture adequate volume Performed at [facility], [address], [place],  [address]    Culture PENDING  Incomplete   Report Status PENDING  Incomplete         Radiology Studies: DG Chest Portable 1 View  Result Date: 06/22/2022 CLINICAL DATA:  Shortness of breath EXAM: PORTABLE CHEST 1 VIEW COMPARISON:  June 02, 2022 FINDINGS: Complete collapse of the left lung with volume loss and shift of the heart mediastinum to the left. Hyperexpansion of the right lung identified. Pleural effusion on the left is not excluded. No pneumothorax. Haziness in the lateral right lung is mildly more prominent. The right lung is otherwise clear. No other acute abnormalities. IMPRESSION: 1. Mild haziness in the lateral right lung may be due to patient rotation. Early developing infiltrate not completely excluded but considered less likely. Recommend attention on follow-up. 2. Complete collapse of the left lung as above, a stable finding. Electronically  Signed   By: Dorise Bullion III M.D.   On: 06/22/2022 14:30   DG Chest Portable 1 View  Result Date: 06/22/2022 CLINICAL DATA:  Dyspnea EXAM: PORTABLE CHEST 1 VIEW COMPARISON:  06/17/2022 FINDINGS: There is complete collapse of the left lung with marked mediastinal shift to the left and resultant complete opacification of the left hemithorax. Superimposed left pleural fluid is not excluded. The right lung is compensatory early hyperinflated.  Scarring within the left mid lung zone is again noted. No pneumothorax or pleural effusion on the right. Pulmonary vascularity is normal. IMPRESSION: Complete collapse of the left lung with marked mediastinal shift to the left. Superimposed left pleural fluid is not excluded. Electronically Signed   By: Fidela Salisbury M.D.   On: 06/22/2022 00:30        Scheduled Meds:  apixaban  5 mg Oral BID   cefdinir  300 mg Oral BID   dextromethorphan-guaiFENesin  1 tablet Oral BID   dorzolamide  1 drop Left Eye BID   feeding supplement  237 mL Oral BID BM   furosemide  20 mg Oral Daily   guaiFENesin  600 mg Oral BID   latanoprost  1 drop Both Eyes QHS   leptospermum manuka honey  1 Application Topical Daily   loxapine  10 mg Oral QHS   mometasone-formoterol  2 puff Inhalation BID   mupirocin ointment  1 Application Topical Daily   pantoprazole  40 mg Oral BID   polyethylene glycol  17 g Oral Daily   predniSONE  40 mg Oral Daily   silver sulfADIAZINE   Topical Daily   umeclidinium bromide  1 puff Inhalation Daily     LOS: 0 days    Time spent: 35 minutes    Maurilio Puryear Darleen Crocker, DO [facility]  If 7PM-7AM, please contact night-coverage [email or web address] 06/23/2022, 11:45 AM

## 2022-06-23 NOTE — Plan of Care (Signed)
Patient's discharge completed on prior shift. Awaiting transport to facility. Patient not compliant with skin assessment. Patient will be discharging with PIV in place per RN handoff.    Problem: Fluid Volume: Goal: Hemodynamic stability will improve Outcome: Not Progressing   Problem: Clinical Measurements: Goal: Diagnostic test results will improve Outcome: Not Progressing Goal: Signs and symptoms of infection will decrease Outcome: Not Progressing   Problem: Respiratory: Goal: Ability to maintain adequate ventilation will improve Outcome: Not Progressing   Problem: Education: Goal: Knowledge of General Education information will improve Description: Including pain rating scale, medication(s)/side effects and non-pharmacologic comfort measures Outcome: Not Progressing   Problem: Health Behavior/Discharge Planning: Goal: Ability to manage health-related needs will improve Outcome: Not Progressing   Problem: Clinical Measurements: Goal: Ability to maintain clinical measurements within normal limits will improve Outcome: Not Progressing Goal: Will remain free from infection Outcome: Not Progressing Goal: Diagnostic test results will improve Outcome: Not Progressing Goal: Respiratory complications will improve Outcome: Not Progressing Goal: Cardiovascular complication will be avoided Outcome: Not Progressing   Problem: Activity: Goal: Risk for activity intolerance will decrease Outcome: Not Progressing   Problem: Nutrition: Goal: Adequate nutrition will be maintained Outcome: Not Progressing   Problem: Coping: Goal: Level of anxiety will decrease Outcome: Not Progressing   Problem: Elimination: Goal: Will not experience complications related to bowel motility Outcome: Not Progressing Goal: Will not experience complications related to urinary retention Outcome: Not Progressing   Problem: Pain Managment: Goal: General experience of comfort will improve Outcome:  Not Progressing   Problem: Safety: Goal: Ability to remain free from injury will improve Outcome: Not Progressing   Problem: Skin Integrity: Goal: Risk for impaired skin integrity will decrease Outcome: Not Progressing   Problem: Education: Goal: Knowledge of General Education information will improve Description: Including pain rating scale, medication(s)/side effects and non-pharmacologic comfort measures Outcome: Not Progressing   Problem: Health Behavior/Discharge Planning: Goal: Ability to manage health-related needs will improve Outcome: Not Progressing   Problem: Clinical Measurements: Goal: Ability to maintain clinical measurements within normal limits will improve Outcome: Not Progressing Goal: Will remain free from infection Outcome: Not Progressing Goal: Diagnostic test results will improve Outcome: Not Progressing Goal: Respiratory complications will improve Outcome: Not Progressing Goal: Cardiovascular complication will be avoided Outcome: Not Progressing   Problem: Activity: Goal: Risk for activity intolerance will decrease Outcome: Not Progressing   Problem: Nutrition: Goal: Adequate nutrition will be maintained Outcome: Not Progressing   Problem: Coping: Goal: Level of anxiety will decrease Outcome: Not Progressing   Problem: Elimination: Goal: Will not experience complications related to bowel motility Outcome: Not Progressing Goal: Will not experience complications related to urinary retention Outcome: Not Progressing   Problem: Pain Managment: Goal: General experience of comfort will improve Outcome: Not Progressing   Problem: Safety: Goal: Ability to remain free from injury will improve Outcome: Not Progressing   Problem: Skin Integrity: Goal: Risk for impaired skin integrity will decrease Outcome: Not Progressing

## 2022-06-23 NOTE — TOC Initial Note (Addendum)
Transition of Care [facility]) - Initial/Assessment Note    Patient Details  Name: Tony Sullivan MRN: [id number] Date of Birth: 10/15/1961  Transition of [facility]) CM/SW Contact:    Boneta Lucks, RN Phone Number: 06/23/2022, 1:53 PM  Clinical Narrative:         MD consulting TOC for residential hospice. Patients demand for care can not be met at home. He is needing residential hospice care. Patient is agreeable. Patient is active with [facility]. TOC sent a message to [facility] for a residential bed. TOC following.        Addendum : S. Wicker with [facility] requested an evaluation and will check facility bed status and update TOC tomorrow. MD updated.   Expected Discharge Plan: [place] Barriers to Discharge: Continued Medical Work up   Patient Goals and CMS Choice       Expected Discharge Plan and Services Expected Discharge Plan: [place]       Prior Living Arrangements/Services     Activities of Daily Living Home Assistive Devices/Equipment: Environmental consultant (specify type) ADL Screening (condition at time of admission) Patient's cognitive ability adequate to safely complete daily activities?: No Is the patient deaf or have difficulty hearing?: No Does the patient have difficulty seeing, even when wearing glasses/contacts?: No Does the patient have difficulty concentrating, remembering, or making decisions?: No Patient able to express need for assistance with ADLs?: Yes Does the patient have difficulty dressing or bathing?: Yes Independently performs ADLs?: No Communication: Independent Dressing (OT): Needs assistance Is this a change from baseline?: Change from baseline, expected to last >3 days Grooming: Needs assistance Is this a change from baseline?: Change from baseline, expected to last >3 days Feeding: Needs assistance Is this a change from baseline?: Change from baseline, expected to last >3 days Bathing: Needs assistance Is this a change  from baseline?: Change from baseline, expected to last >3 days Toileting: Needs assistance Is this a change from baseline?: Change from baseline, expected to last >3days In/Out Bed: Needs assistance Is this a change from baseline?: Change from baseline, expected to last >3 days Walks in Home: Needs assistance Is this a change from baseline?: Change from baseline, expected to last >3 days Does the patient have difficulty walking or climbing stairs?: Yes Weakness of Legs: Both Weakness of Arms/Hands: Both  Permission Sought/Granted      Emotional Assessment     Admission diagnosis:  Chronic respiratory failure ([place]) [J96.10] Hypoxia [R09.02] Collapse of left lung [J98.11] Acute respiratory failure with hypoxia ([place]) [J96.01] Malignant neoplasm of lung, unspecified laterality, unspecified part of lung ([place]) [C34.90] Acute hypoxemic respiratory failure ([place]) [J96.01] Patient Active Problem List   Diagnosis Date Noted   Acute hypoxemic respiratory failure ([place]) 06/23/2022   Chronic respiratory failure ([place]) 06/22/2022   History of pediculosis 06/18/2022   Sepsis ([place]) 06/17/2022   Candidemia ([place]) 06/02/2022   Encephalopathy 05/31/2022   Hypothermia 05/31/2022   Acute encephalopathy 05/31/2022   Substance abuse ([place]) 05/31/2022   Leg wound, left 05/31/2022   Hospice care 05/26/2022   Decubitus ulcer of back, stage 2 ([place])    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[place])    Acute and chronic respiratory failure with hypoxia ([place]) 05/06/2022   Blistered skin right foot/left leg 05/06/2022   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   Pressure injury of skin 05/06/2022   Acute on chronic respiratory failure with hypoxia and hypercapnia ([place]) 04/29/2022   Hyperkalemia 04/29/2022   AKI (acute kidney injury) ([place])  04/29/2022   Non-small cell lung cancer ([place]) 04/29/2022   History of pulmonary embolism 04/29/2022   H/o Pulmonary embolism ([place]) 04/01/2022   Mass of  upper lobe of left lung    Thoracic compression fracture ([place]) 03/20/2022   Hemoptysis 03/20/2022   Pneumonia 03/13/2022   COPD exacerbation ([place]) 02/27/2022   Pedal edema 02/19/2022   CAP (community acquired pneumonia) 01/07/2022   Prolonged QT interval 01/07/2022   Glaucoma 01/07/2022   Elevated troponin 10/27/2021   Hyperglycemia 10/27/2021   Elevated MCV 10/27/2021   Respiratory failure with hypoxia and hypercapnia ([place]) 10/27/2021   Acute exacerbation of chronic obstructive pulmonary disease (COPD) ([place]) 10/02/2021   Hypophosphatemia 10/02/2021   Physical deconditioning 07/26/2021   Numbness and tingling in left arm 05/06/2021   Leukocytosis 05/05/2021   Severe headache 05/05/2021   Mixed hyperlipidemia 05/24/2020   Hypokalemia 05/24/2020   Chest pain 05/23/2020   Bipolar disorder ([place]) 05/23/2020   HNP (herniated nucleus pulposus), cervical 09/06/2019   Abdominal pain    Poor dentition 11/04/2017   Acute on chronic respiratory failure with hypercapnia ([place]) 08/29/2017   ETOH abuse 03/30/2017   Cigarette smoker 03/30/2017   Acute respiratory failure with hypoxia and hypercapnia (HCC) 03/29/2017   Special screening for malignant neoplasms, colon    Benign neoplasm of ascending colon    Benign neoplasm of descending colon    Hoarseness 08/22/2016   GERD (gastroesophageal reflux disease) 08/22/2016   Atypical chest pain 12/05/2015   Essential hypertension 06/15/2014   Pectoralis muscle strain 01/11/2014   COPD (chronic obstructive pulmonary disease) ([place]) 01/08/2014   Tobacco abuse 01/08/2014   Chronic Cough 01/08/2014   PCP:  [name], FNP Pharmacy:   [facility], [address] AT [place]. [place] [address] Phone: [phone] Fax: [phone]  [facility], [place] [address] Phone:  [phone] Fax: [phone]   Readmission Risk Interventions    06/18/2022    1:25 PM 06/02/2022   11:59 AM 05/06/2022   11:49 AM  Readmission Risk Prevention Plan  Transportation Screening Complete Complete Complete  Medication Review (RN Care Manager) Complete Complete Complete  HRI or Home Care Consult Complete Complete Complete  SW Recovery Care/Counseling Consult Complete Complete Complete  Palliative Care Screening Complete Not Applicable Not [place] Not Applicable Not Applicable Not Applicable

## 2022-06-23 NOTE — Discharge Summary (Signed)
Physician Discharge Summary  Tony Sullivan LZJ:[id number] DOB: April 27, 1961 DOA: 06/22/2022  PCP: [name], FNP  Admit date: 06/22/2022  Discharge date: 06/23/2022  Admitted From:Home hospice  Disposition:  Hospice facility  Recommendations for Outpatient Follow-up:  Follow up with hospice facility Medications as below  Home Health: None   Equipment/Devices: Nasal cannula oxygen  Discharge Condition:Stable  CODE STATUS: DN  Diet recommendation: Heart Healthy  Brief/Interim Summary: Tony Sullivan is a 61 y.o. male with medical history significant of COPD with chronic respiratory failure, chronic collapsed left lung secondary to non-small cell lung cancer with recent sepsis and candidemia.  Patient was recently hospitalized and discharged on 8/23 on home hospice.  The patient largely lives alone but has a hospice nurse that comes daily.  The patient presents due to worsening hypoxia with oxygen saturation in the 80s despite increasing his home O2.  He has been admitted with acute on chronic hypoxemic respiratory failure in the setting of worsening air hunger and need for more aggressive palliative care.  He is agreeable to residential hospice facility.  Facility bed has been obtained and he is stable for discharge.  Poor long-term prognosis noted.  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  Discharge  Instructions  Discharge Instructions     Diet - low sodium heart healthy   Complete by: As directed    Increase activity slowly   Complete by: As directed    No wound care   Complete by: As directed       Allergies as of 06/23/2022       Reactions   Penicillins Anaphylaxis   Has patient had a PCN reaction causing immediate rash, facial/tongue/throat swelling, SOB or lightheadedness with hypotension: Yes Has patient had a PCN reaction causing severe rash involving mucus membranes or skin necrosis: No Has patient had a PCN reaction that required hospitalization No Has patient had a PCN reaction occurring within the last 10 years: No If all of the above answers are "NO", then may proceed with Cephalosporin use. Cephalosporins ok   Levocetirizine Other (See Comments)   Muscle cramps        Medication List     TAKE these medications    acetaminophen 325 MG tablet Commonly known as: TYLENOL Take 2 tablets (650 mg total) by mouth every 6 (six) hours as needed for mild pain (or Fever >/= 101).   albuterol (2.5 MG/3ML) 0.083% nebulizer solution Commonly known as: PROVENTIL Take 3 mLs (2.5 mg total) by nebulization every 4 (four) hours as needed for wheezing or shortness of breath.   albuterol 108 (90 Base) MCG/ACT inhaler Commonly known as: VENTOLIN HFA Inhale 2 puffs into the lungs every 6 (six) hours as needed for wheezing or shortness of breath.   ALPRAZolam 0.5 MG tablet Commonly known as: XANAX Take 1 tablet (0.5 mg total) by mouth every 6 (six) hours as needed for anxiety (agitation and comfort.).   apixaban 5 MG Tabs tablet Commonly known as: ELIQUIS Take 1  tablet (5 mg total) by mouth 2 (two) times daily.   Breztri Aerosphere 160-9-4.8 MCG/ACT Aero Generic drug: Budeson-Glycopyrrol-Formoterol Inhale 2 puffs into the lungs in the morning and at bedtime.   cefdinir 300 MG capsule Commonly known as: OMNICEF Take 1 capsule (300 mg total) by mouth 2 (two) times  daily for 5 days.   collagenase 250 UNIT/GM ointment Commonly known as: SANTYL Apply topically daily. Apply to coccyx and back pressure injuries once daily after cleaning area with normal saline.  Cover with saline dampened gauze and top with silicone foam.   dextromethorphan-guaiFENesin 30-600 MG 12hr tablet Commonly known as: MUCINEX DM Take 1 tablet by mouth 2 (two) times daily.   dorzolamide 2 % ophthalmic solution Commonly known as: TRUSOPT Place 1 drop into the left eye 2 (two) times daily.   feeding supplement Liqd Take 237 mLs by mouth 2 (two) times daily between meals.   furosemide 20 MG tablet Commonly known as: LASIX Take 1 tablet (20 mg total) by mouth daily.   hydrOXYzine 25 MG tablet Commonly known as: ATARAX Take 1 tablet (25 mg total) by mouth 3 (three) times daily as needed for anxiety.   latanoprost 0.005 % ophthalmic solution Commonly known as: XALATAN Place 1 drop into both eyes at bedtime.   loxapine 10 MG capsule Commonly known as: LOXITANE Take 1 capsule (10 mg total) by mouth at bedtime.   morphine 20 MG/5ML solution Take 1.3 mLs (5.2 mg total) by mouth every 6 (six) hours as needed for pain (SOB, agitation and distress.).   mupirocin ointment 2 % Commonly known as: BACTROBAN Apply 1 Application topically daily.   OXYGEN Inhale 4-5 L into the lungs as directed. 4-5 liters with sleep and exertion as needed for low oxygen   pantoprazole 40 MG tablet Commonly known as: PROTONIX Take 1 tablet (40 mg total) by mouth 2 (two) times daily.   polyethylene glycol 17 g packet Commonly known as: MIRALAX / GLYCOLAX Take 17 g by mouth daily.   predniSONE 10 MG tablet Commonly known as: DELTASONE Take 4 tablets (40 mg total) by mouth daily for 5 days.   senna-docusate 8.6-50 MG tablet Commonly known as: Senokot-S Take 2 tablets by mouth at bedtime. What changed:  when to take this reasons to take this   silver sulfADIAZINE 1 % cream Commonly  known as: SILVADENE Apply topically daily. Apply to bilateral lower extremity wounds in 1/8 inch layer after cleaning with normal saline, top with saline moistened gauze, ABD pad and secured with Kerlix roll gauze/paper tape.        Allergies  Allergen Reactions   Penicillins Anaphylaxis    Has patient had a PCN reaction causing immediate rash, facial/tongue/throat swelling, SOB or lightheadedness with hypotension: Yes Has patient had a PCN reaction causing severe rash involving mucus membranes or skin necrosis: No Has patient had a PCN reaction that required hospitalization No Has patient had a PCN reaction occurring within the last 10 years: No If all of the above answers are "NO", then may proceed with Cephalosporin use. Cephalosporins ok   Levocetirizine Other (See Comments)    Muscle cramps    Consultations: None   Procedures/Studies: DG Chest Portable 1 View  Result Date: 06/22/2022 CLINICAL DATA:  Shortness of breath EXAM: PORTABLE CHEST 1 VIEW COMPARISON:  June 02, 2022 FINDINGS: Complete collapse of the left lung with volume loss and shift of the heart mediastinum to the left. Hyperexpansion of the right lung identified. Pleural effusion on  the left is not excluded. No pneumothorax. Haziness in the lateral right lung is mildly more prominent. The right lung is otherwise clear. No other acute abnormalities. IMPRESSION: 1. Mild haziness in the lateral right lung may be due to patient rotation. Early developing infiltrate not completely excluded but considered less likely. Recommend attention on follow-up. 2. Complete collapse of the left lung as above, a stable finding. Electronically Signed   By: Dorise Bullion III M.D.   On: 06/22/2022 14:30   DG Chest Portable 1 View  Result Date: 06/22/2022 CLINICAL DATA:  Dyspnea EXAM: PORTABLE CHEST 1 VIEW COMPARISON:  06/17/2022 FINDINGS: There is complete collapse of the left lung with marked mediastinal shift to the left and resultant  complete opacification of the left hemithorax. Superimposed left pleural fluid is not excluded. The right lung is compensatory early hyperinflated. Scarring within the left mid lung zone is again noted. No pneumothorax or pleural effusion on the right. Pulmonary vascularity is normal. IMPRESSION: Complete collapse of the left lung with marked mediastinal shift to the left. Superimposed left pleural fluid is not excluded. Electronically Signed   By: Fidela Salisbury M.D.   On: 06/22/2022 00:30   DG Chest Portable 1 View  Result Date: 06/17/2022 CLINICAL DATA:  Shortness of breath EXAM: PORTABLE CHEST 1 VIEW COMPARISON:  06/11/2022 FINDINGS: Complete opacification of the left hemithorax with volume loss and shift of mediastinal contours to the left. No confluent opacities on the right. Areas of scarring in the right lung. No right effusion. No acute bony abnormality. IMPRESSION: Volume loss with complete opacification of the left hemithorax. Electronically Signed   By: Rolm Baptise M.D.   On: 06/17/2022 20:10   DG CHEST PORT 1 VIEW  Result Date: 06/11/2022 CLINICAL DATA:  Shortness of breath.  Chest pain. EXAM: PORTABLE CHEST 1 VIEW COMPARISON:  06/09/2022 FINDINGS: Diminishing amount of pleural air on the left. Increasing opacification consistent with pleural fluid. Continue mediastinal shift towards the left consistent with volume loss of the left lung. Hyperinflated right lung with scarring in the midportion as seen previously. IMPRESSION: Diminishing amount of pleural air on the left. Continued collapse of the left lung with mediastinal shift towards the left. Electronically Signed   By: Nelson Chimes M.D.   On: 06/11/2022 09:24   DG CHEST PORT 1 VIEW  Result Date: 06/09/2022 CLINICAL DATA:  COPD, chronic respiratory failure EXAM: PORTABLE CHEST 1 VIEW COMPARISON:  None Available. FINDINGS: Subtotal atelectasis of the left lung with loculated left apical hydrothorax. There is significant right to left  shift of the cardiac and mediastinal structures secondary to the underlying volume loss. Suspected mucous plugging of the left mainstem bronchus. The right lung remains hyperinflated with chronic bronchitic changes and emphysema. IMPRESSION: 1. Persistent subtotal atelectasis of the left lung likely due to mucous plugging at the left mainstem bronchus. 2. Hydropneumothorax predominantly in the superior aspect of the pleural space. Electronically Signed   By: Jacqulynn Cadet M.D.   On: 06/09/2022 07:43   [place] CHEST (PLEURAL EFFUSION)  Result Date: 06/07/2022 CLINICAL DATA:  New opacification of left hemithorax. Evaluate for large left pleural effusion. EXAM: CHEST ULTRASOUND COMPARISON:  Chest radiograph 06/07/2022 FINDINGS: Small amount of left pleural fluid is identified. No significant right pleural fluid identified. IMPRESSION: Small left pleural effusion. Opacification of left hemithorax on the recent chest radiograph is most likely related to volume loss rather than large pleural effusion. Electronically Signed   By: Markus Daft M.D.   On: 06/07/2022  14:21   DG CHEST PORT 1 VIEW  Result Date: 06/07/2022 CLINICAL DATA:  Shortness of breath. EXAM: PORTABLE CHEST 1 VIEW COMPARISON:  June 02, 2022 FINDINGS: There is near complete opacification of the left hemithorax which is new in the interval with shift of the heart mediastinum to the left. There is compensatory expansion of the right lung. The cardiomediastinal silhouette is not well assessed. No pneumothorax on the right. No other acute abnormalities identified. IMPRESSION: 1. Near complete opacification of the left hemithorax, new in the interval. There is also volume loss on the left with shift of the heart and mediastinum into the left chest and compensatory hyperinflation of the right lung. Electronically Signed   By: Dorise Bullion III M.D.   On: 06/07/2022 13:24   ECHOCARDIOGRAM COMPLETE  Result Date: 06/03/2022    ECHOCARDIOGRAM REPORT    Patient Name:   BAO BAZEN Novelo Date of Exam: 06/03/2022 Medical Rec #:  [id number]      Height:       71.0 in Accession #:    [id number]     Weight:       131.0 lb Date of Birth:  03/12/1961       BSA:          1.761 m Patient Age:    67 years       BP:           123/81 mmHg Patient Gender: M              HR:           79 bpm. Exam Location:  [facility] Procedure: 2D Echo, Cardiac Doppler and Color Doppler Indications:    Bacteremia  History:        Patient has prior history of Echocardiogram examinations, most                 recent 02/28/2022. COPD, Signs/Symptoms:Chest Pain and Bacteremia;                 Risk Factors:Hypertension, Dyslipidemia and Current Smoker.                 Pulmonary embolus, Lung CA, ETOH abuse, Bipolar.  Sonographer:    Wenda Low Referring Phys: [place]  1. Left ventricular ejection fraction, by estimation, is 55 to 60%. Left ventricular ejection fraction by 2D MOD biplane is 57.0 %. The left ventricle has normal function. The left ventricle has no regional wall motion abnormalities. Left ventricular diastolic parameters are consistent with Grade I diastolic dysfunction (impaired relaxation).  2. Right ventricular systolic function is normal. The right ventricular size is normal. There is normal pulmonary artery systolic pressure. The estimated right ventricular systolic pressure is 65.7 mmHg.  3. The mitral valve is grossly normal. No evidence of mitral valve regurgitation.  4. The aortic valve is tricuspid. Aortic valve regurgitation is not visualized.  5. The inferior vena cava is normal in size with greater than 50% respiratory variability, suggesting right atrial pressure of 3 mmHg. Comparison(s): Changes from prior study are noted. 02/28/2022: LVEF 60-65%, grade 1 DD. Conclusion(s)/Recommendation(s): No evidence of valvular vegetations on this transthoracic echocardiogram. Consider a transesophageal echocardiogram to exclude infective endocarditis if  clinically indicated. FINDINGS  Left Ventricle: Left ventricular ejection fraction, by estimation, is 55 to 60%. Left ventricular ejection fraction by 2D MOD biplane is 57.0 %. The left ventricle has normal function. The left ventricle has no regional wall motion abnormalities. The left ventricular  internal cavity size was normal in size. There is no left ventricular hypertrophy. Left ventricular diastolic parameters are consistent with Grade I diastolic dysfunction (impaired relaxation). Indeterminate filling pressures. Right Ventricle: The right ventricular size is normal. No increase in right ventricular wall thickness. Right ventricular systolic function is normal. There is normal pulmonary artery systolic pressure. The tricuspid regurgitant velocity is 2.40 m/s, and  with an assumed right atrial pressure of 3 mmHg, the estimated right ventricular systolic pressure is 60.4 mmHg. Left Atrium: Left atrial size was normal in size. Right Atrium: Right atrial size was normal in size. Pericardium: There is no evidence of pericardial effusion. Mitral Valve: The mitral valve is grossly normal. No evidence of mitral valve regurgitation. MV peak gradient, 6.1 mmHg. The mean mitral valve gradient is 2.0 mmHg. Tricuspid Valve: The tricuspid valve is grossly normal. Tricuspid valve regurgitation is trivial. Aortic Valve: The aortic valve is tricuspid. Aortic valve regurgitation is not visualized. Aortic valve mean gradient measures 2.0 mmHg. Aortic valve peak gradient measures 5.8 mmHg. Aortic valve area, by VTI measures 2.49 cm. Pulmonic Valve: The pulmonic valve was normal in structure. Pulmonic valve regurgitation is not visualized. Aorta: The aortic root and ascending aorta are structurally normal, with no evidence of dilitation. Venous: The inferior vena cava is normal in size with greater than 50% respiratory variability, suggesting right atrial pressure of 3 mmHg. IAS/Shunts: No atrial level shunt detected by color  flow Doppler.  LEFT VENTRICLE PLAX 2D                        Biplane EF (MOD) LVIDd:         3.70 cm         LV Biplane EF:   Left LVIDs:         2.70 cm                          ventricular LV PW:         1.20 cm                          ejection LV IVS:        1.00 cm                          fraction by LVOT diam:     2.00 cm                          2D MOD LV SV:         52                               biplane is LV SV Index:   29                               57.0 %. LVOT Area:     3.14 cm                                Diastology  LV e' medial:    7.07 cm/s LV Volumes (MOD)               LV E/e' medial:  13.2 LV vol d, MOD    56.8 ml       LV e' lateral:   6.96 cm/s A2C:                           LV E/e' lateral: 13.4 LV vol d, MOD    39.4 ml A4C: LV vol s, MOD    20.7 ml A2C: LV vol s, MOD    17.2 ml A4C: LV SV MOD A2C:   36.1 ml LV SV MOD A4C:   39.4 ml LV SV MOD BP:    27.6 ml RIGHT VENTRICLE RV Basal diam:  2.65 cm RV Mid diam:    2.40 cm RV S prime:     12.00 cm/s TAPSE (M-mode): 2.2 cm LEFT ATRIUM             Index        RIGHT ATRIUM           Index LA diam:        3.10 cm 1.76 cm/m   RA Area:     12.70 cm LA Vol (A2C):   31.0 ml 17.60 ml/m  RA Volume:   27.40 ml  15.56 ml/m LA Vol (A4C):   20.2 ml 11.47 ml/m LA Biplane Vol: 24.1 ml 13.68 ml/m  AORTIC VALVE                    PULMONIC VALVE AV Area (Vmax):    2.88 cm     PV Vmax:       0.99 m/s AV Area (Vmean):   2.94 cm     PV Peak grad:  3.9 mmHg AV Area (VTI):     2.49 cm AV Vmax:           120.00 cm/s AV Vmean:          70.300 cm/s AV VTI:            0.207 m AV Peak Grad:      5.8 mmHg AV Mean Grad:      2.0 mmHg LVOT Vmax:         110.00 cm/s LVOT Vmean:        65.800 cm/s LVOT VTI:          0.164 m LVOT/AV VTI ratio: 0.79  AORTA Ao Root diam: 3.60 cm MITRAL VALVE                TRICUSPID VALVE MV Area (PHT): 2.46 cm     TR Peak grad:   23.0 mmHg MV Area VTI:   2.36 cm     TR Vmax:        240.00 cm/s MV  Peak grad:  6.1 mmHg MV Mean grad:  2.0 mmHg     SHUNTS MV Vmax:       1.23 m/s     Systemic VTI:  0.16 m MV Vmean:      63.4 cm/s    Systemic Diam: 2.00 cm MV Decel Time: 308 msec MV E velocity: 93.40 cm/s MV A velocity: 109.00 cm/s MV E/A ratio:  0.86 Lyman Bishop MD Electronically signed by Lyman Bishop MD Signature Date/Time: 06/03/2022/11:55:08 AM    Final    DG CHEST PORT 1 VIEW  Result Date: 06/02/2022  CLINICAL DATA:  Shortness of breath. EXAM: PORTABLE CHEST 1 VIEW COMPARISON:  05/31/2022 FINDINGS: Lungs are hyperexpanded. Blunting of the left costophrenic angle may be related to a tiny effusion or scarring. Architectural distortion noted left mid lung. New subtle airspace disease noted left base. Old posterior right rib fracture evident. Telemetry leads overlie the chest. IMPRESSION: Emphysema with new subtle airspace disease left base suggesting pneumonia. Electronically Signed   By: Misty Stanley M.D.   On: 06/02/2022 16:52   CT Head Wo Contrast  Result Date: 05/31/2022 CLINICAL DATA:  Mental status change, found down EXAM: CT HEAD WITHOUT CONTRAST TECHNIQUE: Contiguous axial images were obtained from the base of the skull through the vertex without intravenous contrast. RADIATION DOSE REDUCTION: This exam was performed according to the departmental dose-optimization program which includes automated exposure control, adjustment of the mA and/or kV according to patient size and/or use of iterative reconstruction technique. COMPARISON:  CT head 05/15/2022 FINDINGS: Brain: No intracranial hemorrhage, mass effect, or evidence of acute infarct. No hydrocephalus. No extra-axial fluid collection. Vascular: No hyperdense vessel or unexpected calcification. Skull: No fracture or focal lesion. Sinuses/Orbits: No acute finding. Minimal mucosal thickening in the maxillary sinuses. Other: None. IMPRESSION: No acute intracranial abnormality. Electronically Signed   By: Placido Sou M.D.   On: 05/31/2022 19:14    DG Chest Port 1 View  Result Date: 05/31/2022 CLINICAL DATA:  [place] out of chair, found down EXAM: PORTABLE CHEST 1 VIEW COMPARISON:  05/21/2022 FINDINGS: Three frontal views of the chest are obtained. Cardiac silhouette is stable. Background emphysema again noted without airspace disease, effusion, or pneumothorax. Stable left suprahilar prominence consistent with known endobronchial malignancy. No acute bony abnormality. IMPRESSION: 1. Stable left suprahilar prominence consistent with known left upper lobe endobronchial malignancy. Please refer to previous CT 05/15/2022. 2. Emphysema.  No acute airspace disease. Electronically Signed   By: Randa Ngo M.D.   On: 05/31/2022 18:37   DG Tibia/Fibula Left  Result Date: 05/31/2022 CLINICAL DATA:  Found down, possible sepsis EXAM: LEFT TIBIA AND FIBULA - 2 VIEW COMPARISON:  None Available. FINDINGS: There is no evidence of fracture or other focal bone lesions. Bandage material about the anterior and lateral lower leg. IMPRESSION: No fracture or dislocation of the left tibia or fibula. Bandage material about the anterior and lateral lower leg. Electronically Signed   By: Delanna Ahmadi M.D.   On: 05/31/2022 18:37     Discharge Exam: Vitals:   06/23/22 1201 06/23/22 1326  BP: 118/84 129/82  Pulse: 94 90  Resp: 19 18  Temp: 97.7 F (36.5 C) 98.4 F (36.9 C)  SpO2: 98% 98%   Vitals:   06/23/22 0534 06/23/22 0821 06/23/22 1201 06/23/22 1326  BP: 125/89 121/86 118/84 129/82  Pulse: 92 93 94 90  Resp: (!) 22 20 19 18   Temp: 98.1 F (36.7 C) (!) 97.5 F (36.4 C) 97.7 F (36.5 C) 98.4 F (36.9 C)  TempSrc: Oral Oral Oral Oral  SpO2: 98% 99% 98% 98%  Weight:      Height:        General: Pt is alert, awake, not in acute distress Cardiovascular: RRR, S1/S2 +, no rubs, no gallops Respiratory: CTA bilaterally, no wheezing, no rhonchi, 6 L nasal cannula oxygen Abdominal: Soft, NT, ND, bowel sounds + Extremities: no edema, no  cyanosis    The results of significant diagnostics from this hospitalization (including imaging, microbiology, ancillary and laboratory) are listed below for reference.     Microbiology:  Recent Results (from the past 240 hour(s))  Urine Culture     Status: None   Collection Time: 06/17/22  8:16 PM   Specimen: Urine, Clean Catch  Result Value Ref Range Status   Specimen Description   Final    URINE, CLEAN CATCH Performed at [facility], [address] [place], [place] [address]    Special Requests   Final    NONE Performed at [facility], [address], [place], [place] [address]    Culture   Final    NO GROWTH Performed at [facility], [place] [address] [place], [place] [address]    Report Status 06/19/2022 FINAL  Final  SARS Coronavirus 2 by RT PCR (hospital order, performed in [facility] hospital lab) *cepheid single result test* Urine, In & Out Cath     Status: None   Collection Time: 06/17/22  8:16 PM   Specimen: Urine, In & Out Cath; Nasal Swab  Result Value Ref Range Status   SARS Coronavirus 2 by RT PCR NEGATIVE NEGATIVE Final    Comment: (NOTE) SARS-CoV-2 target nucleic acids are NOT DETECTED.  The SARS-CoV-2 RNA is generally detectable in upper and lower respiratory specimens during the acute phase of infection. The lowest concentration of SARS-CoV-2 viral copies this assay can detect is 250 copies / mL. A negative result does not preclude SARS-CoV-2 infection and should not be used as the sole basis for treatment or other patient management decisions.  A negative result may occur with improper specimen collection / handling, submission of specimen other than nasopharyngeal swab, presence of viral mutation(s) within the areas targeted by this assay, and inadequate number of viral copies (<250 copies / mL). A negative result must be combined with clinical observations, patient history, and epidemiological information.  Fact Sheet for Patients:    https://www.patel.info/  Fact Sheet for Healthcare Providers: https://hall.com/  This test is not yet approved or  cleared by the [place] FDA and has been authorized for detection and/or diagnosis of SARS-CoV-2 by FDA under an Emergency Use Authorization (EUA).  This EUA will remain in effect (meaning this test can be used) for the duration of the COVID-19 declaration under Section 564(b)(1) of the Act, 21 U.S.C. section 360bbb-3(b)(1), unless the authorization is terminated or revoked sooner.  Performed at [facility], [address] [place], [place] [address]   Blood culture (routine x 2)     Status: None   Collection Time: 06/17/22  9:07 PM   Specimen: BLOOD RIGHT HAND  Result Value Ref Range Status   Specimen Description BLOOD RIGHT HAND  Final   Special Requests   Final    BOTTLES DRAWN AEROBIC AND ANAEROBIC Blood Culture adequate volume   Culture   Final    NO GROWTH 5 DAYS Performed at [facility], [address] [place], [place] [address]    Report Status 06/22/2022 FINAL  Final  Blood culture (routine x 2)     Status: None   Collection Time: 06/17/22  9:08 PM   Specimen: Right Antecubital; Blood  Result Value Ref Range Status   Specimen Description RIGHT ANTECUBITAL  Final   Special Requests   Final    BOTTLES DRAWN AEROBIC AND ANAEROBIC Blood Culture adequate volume   Culture   Final    NO GROWTH 5 DAYS Performed at [facility], [address], [place],  [address]    Report Status 06/22/2022 FINAL  Final  Fungus culture, blood  Status: None (Preliminary result)   Collection Time: 06/17/22 10:42 PM   Specimen: BLOOD RIGHT HAND  Result Value Ref Range Status   Specimen Description BLOOD RIGHT HAND  Final   Special Requests   Final    BOTTLES DRAWN AEROBIC AND ANAEROBIC Blood Culture adequate volume Performed at [facility], [address] [place], [place] [address]    Culture PENDING  Incomplete    Report Status PENDING  Incomplete     Labs: BNP (last 3 results) Recent Labs    05/06/22 0420 06/02/22 1608 06/22/22 1436  BNP 66.0 151.0* 191.4*   Basic Metabolic Panel: Recent Labs  Lab 06/17/22 2106 06/18/22 0443 06/19/22 0316 06/22/22 1436  NA 139 137 137 140  K 3.4* 3.4* 4.4 4.3  CL 99 102 103 101  CO2 34* 30 30 31   GLUCOSE 98 70 161* 74  BUN 21* 21* 19 10  CREATININE 0.83 0.69 0.63 0.60*  CALCIUM 8.1* 7.8* 7.5* 9.0  MG  --  2.1 2.0  --    Liver Function Tests: Recent Labs  Lab 06/17/22 2106 06/18/22 0443  AST 42* 32  ALT 45* 42  ALKPHOS 105 88  BILITOT 0.2* 0.8  PROT 4.9* 4.3*  ALBUMIN 1.7* 1.5*   No results for input(s): "LIPASE", "AMYLASE" in the last 168 hours. No results for input(s): "AMMONIA" in the last 168 hours. CBC: Recent Labs  Lab 06/17/22 2106 06/18/22 0443 06/19/22 0316 06/22/22 1436  WBC 6.7 5.5 6.2 8.9  NEUTROABS 5.4 4.4  --  7.2  HGB 9.2* 8.6* 7.5* 10.1*  HCT 30.8* 28.9* 25.2* 34.1*  MCV 101.7* 102.1* 101.2* 101.8*  PLT 165 140* 132* 245   Cardiac Enzymes: No results for input(s): "CKTOTAL", "CKMB", "CKMBINDEX", "TROPONINI" in the last 168 hours. BNP: Invalid input(s): "POCBNP" CBG: Recent Labs  Lab 06/23/22 0804  GLUCAP 87   D-Dimer No results for input(s): "DDIMER" in the last 72 hours. Hgb A1c No results for input(s): "HGBA1C" in the last 72 hours. Lipid Profile No results for input(s): "CHOL", "HDL", "LDLCALC", "TRIG", "CHOLHDL", "LDLDIRECT" in the last 72 hours. Thyroid function studies No results for input(s): "TSH", "T4TOTAL", "T3FREE", "THYROIDAB" in the last 72 hours.  Invalid input(s): "FREET3" Anemia work up No results for input(s): "VITAMINB12", "FOLATE", "FERRITIN", "TIBC", "IRON", "RETICCTPCT" in the last 72 hours. Urinalysis    Component Value Date/Time   COLORURINE AMBER (A) 06/17/2022 2016   APPEARANCEUR HAZY (A) 06/17/2022 2016   LABSPEC 1.017 06/17/2022 2016   PHURINE 5.0 06/17/2022 2016    GLUCOSEU NEGATIVE 06/17/2022 2016   HGBUR NEGATIVE 06/17/2022 2016   BILIRUBINUR NEGATIVE 06/17/2022 2016   KETONESUR NEGATIVE 06/17/2022 2016   PROTEINUR 30 (A) 06/17/2022 2016   UROBILINOGEN 1.0 06/12/2015 1021   NITRITE NEGATIVE 06/17/2022 2016   LEUKOCYTESUR NEGATIVE 06/17/2022 2016   Sepsis Labs Recent Labs  Lab 06/17/22 2106 06/18/22 0443 06/19/22 0316 06/22/22 1436  WBC 6.7 5.5 6.2 8.9   Microbiology Recent Results (from the past 240 hour(s))  Urine Culture     Status: None   Collection Time: 06/17/22  8:16 PM   Specimen: Urine, Clean Catch  Result Value Ref Range Status   Specimen Description   Final    URINE, CLEAN CATCH Performed at [facility], [address] [place], [place] [address]    Special Requests   Final    NONE Performed at [facility], [address], [place], [place] [address]    Culture   Final  NO GROWTH Performed at [facility], [place] [address], [place], [place] [address]    Report Status 06/19/2022 FINAL  Final  SARS Coronavirus 2 by RT PCR (hospital order, performed in [facility] hospital lab) *cepheid single result test* Urine, In & Out Cath     Status: None   Collection Time: 06/17/22  8:16 PM   Specimen: Urine, In & Out Cath; Nasal Swab  Result Value Ref Range Status   SARS Coronavirus 2 by RT PCR NEGATIVE NEGATIVE Final    Comment: (NOTE) SARS-CoV-2 target nucleic acids are NOT DETECTED.  The SARS-CoV-2 RNA is generally detectable in upper and lower respiratory specimens during the acute phase of infection. The lowest concentration of SARS-CoV-2 viral copies this assay can detect is 250 copies / mL. A negative result does not preclude SARS-CoV-2 infection and should not be used as the sole basis for treatment or other patient management decisions.  A negative result may occur with improper specimen collection / handling, submission of specimen other than nasopharyngeal swab, presence of viral mutation(s) within the areas  targeted by this assay, and inadequate number of viral copies (<250 copies / mL). A negative result must be combined with clinical observations, patient history, and epidemiological information.  Fact Sheet for Patients:   https://www.patel.info/  Fact Sheet for Healthcare Providers: https://hall.com/  This test is not yet approved or  cleared by the [place] FDA and has been authorized for detection and/or diagnosis of SARS-CoV-2 by FDA under an Emergency Use Authorization (EUA).  This EUA will remain in effect (meaning this test can be used) for the duration of the COVID-19 declaration under Section 564(b)(1) of the Act, 21 U.S.C. section 360bbb-3(b)(1), unless the authorization is terminated or revoked sooner.  Performed at [facility], [address] [place], [place] [address]   Blood culture (routine x 2)     Status: None   Collection Time: 06/17/22  9:07 PM   Specimen: BLOOD RIGHT HAND  Result Value Ref Range Status   Specimen Description BLOOD RIGHT HAND  Final   Special Requests   Final    BOTTLES DRAWN AEROBIC AND ANAEROBIC Blood Culture adequate volume   Culture   Final    NO GROWTH 5 DAYS Performed at [facility], [address], [place], [place] [address]    Report Status 06/22/2022 FINAL  Final  Blood culture (routine x 2)     Status: None   Collection Time: 06/17/22  9:08 PM   Specimen: Right Antecubital; Blood  Result Value Ref Range Status   Specimen Description RIGHT ANTECUBITAL  Final   Special Requests   Final    BOTTLES DRAWN AEROBIC AND ANAEROBIC Blood Culture adequate volume   Culture   Final    NO GROWTH 5 DAYS Performed at [facility], [address], [place], [place] [address]    Report Status 06/22/2022 FINAL  Final  Fungus culture, blood     Status: None (Preliminary result)   Collection Time: 06/17/22 10:42 PM   Specimen: BLOOD RIGHT HAND  Result Value Ref Range Status   Specimen Description  BLOOD RIGHT HAND  Final   Special Requests   Final    BOTTLES DRAWN AEROBIC AND ANAEROBIC Blood Culture adequate volume Performed at [facility], [address] [place], [place] [address]    Culture PENDING  Incomplete   Report Status PENDING  Incomplete     Time coordinating discharge: 35 minutes  SIGNED:   Rodena Goldmann,  DO [facility] 06/23/2022, 3:07 PM  If 7PM-7AM, please contact night-coverage [email or web address]

## 2022-06-23 NOTE — Plan of Care (Signed)

## 2022-06-23 NOTE — Progress Notes (Addendum)
Epic chat sent to Dr. Josephine Cables at 2052 hours: "Good evening. Patient discharging to [facility]. I need for you to complete the "discharge order reconciliation" so I can print the AVS. Thanks in advance!". Awaiting response at this time.   Edit: 2108 hrs: DC med rec. completed by Dr. Josephine Cables.  Edit 2245 hrs: This RN spoke with [employer]. Dispatch. Per dispatch, no trucks available for transport at this time. No ETA for transport.   Edit: 0010 hrs: Patient declined wound care at this time.   Edit: 0525 hrs: patient refused bathing and wound care for a 3rd  time this shift.

## 2022-06-23 NOTE — Progress Notes (Signed)
Manufacturing engineer [facility]) Hospital Liaison Note  Plan is for patient to transfer to [facility] today. Unit RN please call report to [phone] prior to patient leaving the unit. Please send signed DNR and paperwork with patient.   Please leave all IV access and ports in place.  Please call with any questions or concerns. Thank you  Roselee Nova, [facility] Liaison  [phone]

## 2022-06-23 NOTE — Progress Notes (Signed)
Received pt from ED department, Pt stated, turned and repositioned , pt wet and noted with deep wound possible stage 4 to back area. Area cleanse with NS, then applied aquacel and mipalex to lower back wound, applying,  addition mipalex all of areas. SRP, RN

## 2022-06-23 NOTE — Plan of Care (Signed)
Patient on hospice came in last night going to [facility] for hospice care, called report to [facility] waiting on [facility] EMS. Refuses to be cleaned up or have dressing changed, continue to monitor. Problem: Fluid Volume: Goal: Hemodynamic stability will improve Outcome: Adequate for Discharge   Problem: Clinical Measurements: Goal: Diagnostic test results will improve Outcome: Adequate for Discharge Goal: Signs and symptoms of infection will decrease Outcome: Adequate for Discharge   Problem: Respiratory: Goal: Ability to maintain adequate ventilation will improve Outcome: Adequate for Discharge   Problem: Education: Goal: Knowledge of General Education information will improve Description: Including pain rating scale, medication(s)/side effects and non-pharmacologic comfort measures Outcome: Adequate for Discharge   Problem: Health Behavior/Discharge Planning: Goal: Ability to manage health-related needs will improve Outcome: Adequate for Discharge   Problem: Clinical Measurements: Goal: Ability to maintain clinical measurements within normal limits will improve Outcome: Adequate for Discharge Goal: Will remain free from infection Outcome: Adequate for Discharge Goal: Diagnostic test results will improve Outcome: Adequate for Discharge Goal: Respiratory complications will improve Outcome: Adequate for Discharge Goal: Cardiovascular complication will be avoided Outcome: Adequate for Discharge   Problem: Activity: Goal: Risk for activity intolerance will decrease Outcome: Adequate for Discharge   Problem: Nutrition: Goal: Adequate nutrition will be maintained Outcome: Adequate for Discharge   Problem: Coping: Goal: Level of anxiety will decrease Outcome: Adequate for Discharge   Problem: Elimination: Goal: Will not experience complications related to bowel motility Outcome: Adequate for Discharge Goal: Will not experience complications related to urinary  retention Outcome: Adequate for Discharge   Problem: Pain Managment: Goal: General experience of comfort will improve Outcome: Adequate for Discharge   Problem: Safety: Goal: Ability to remain free from injury will improve Outcome: Adequate for Discharge   Problem: Skin Integrity: Goal: Risk for impaired skin integrity will decrease Outcome: Adequate for Discharge   Problem: Education: Goal: Knowledge of General Education information will improve Description: Including pain rating scale, medication(s)/side effects and non-pharmacologic comfort measures Outcome: Adequate for Discharge   Problem: Health Behavior/Discharge Planning: Goal: Ability to manage health-related needs will improve Outcome: Adequate for Discharge   Problem: Clinical Measurements: Goal: Ability to maintain clinical measurements within normal limits will improve Outcome: Adequate for Discharge Goal: Will remain free from infection Outcome: Adequate for Discharge Goal: Diagnostic test results will improve Outcome: Adequate for Discharge Goal: Respiratory complications will improve Outcome: Adequate for Discharge Goal: Cardiovascular complication will be avoided Outcome: Adequate for Discharge   Problem: Activity: Goal: Risk for activity intolerance will decrease Outcome: Adequate for Discharge   Problem: Nutrition: Goal: Adequate nutrition will be maintained Outcome: Adequate for Discharge   Problem: Coping: Goal: Level of anxiety will decrease Outcome: Adequate for Discharge   Problem: Elimination: Goal: Will not experience complications related to bowel motility Outcome: Adequate for Discharge Goal: Will not experience complications related to urinary retention Outcome: Adequate for Discharge   Problem: Pain Managment: Goal: General experience of comfort will improve Outcome: Adequate for Discharge   Problem: Safety: Goal: Ability to remain free from injury will improve Outcome:  Adequate for Discharge   Problem: Skin Integrity: Goal: Risk for impaired skin integrity will decrease Outcome: Adequate for Discharge

## 2022-06-23 NOTE — Progress Notes (Signed)
Called report to [facility] at [facility] [phone] patient will go with IV and condom cath reported how he refused dressing changes today.

## 2022-06-23 NOTE — TOC Transition Note (Signed)
Transition of Care [facility]) - CM/SW Discharge Note   Patient Details  Name: Tony Sullivan MRN: [id number] Date of Birth: 25-May-1961  Transition of Care [facility]) CM/SW Contact:  Boneta Lucks, RN Phone Number: 06/23/2022, 4:08 PM   Clinical Narrative:   [facility] care evaluation completed and accepted for [facility], [facility] bed and can take him today. Daughter is agreeable. Medical necessity printed and RN to call report. TOC scheduled EMS.    Barriers to Discharge: Barriers Resolved   Patient Goals and CMS Choice Patient states their goals for this hospitalization and ongoing recovery are:: Resendial hospice CMS Medicare.gov Compare Post Acute Care list provided to:: Patient Represenative (must comment) Choice offered to / list presented to : Adult Children  Discharge Placement              Patient chooses bed at:  [facility] place) Patient to be transferred to facility by: [facility] Name of family member notified: Daughter Patient and family notified of of transfer: 06/23/22  Discharge Plan and [place]        Readmission Risk Interventions    06/18/2022    1:25 PM 06/02/2022   11:59 AM 05/06/2022   11:49 AM  Readmission Risk Prevention Plan  Transportation Screening Complete Complete Complete  Medication Review Press photographer) Complete Complete Complete  HRI or Home Care Consult Complete Complete Complete  SW Recovery Care/Counseling Consult Complete Complete Complete  Palliative Care Screening Complete Not Applicable Not [place] Not Applicable Not Applicable Not Applicable

## 2022-06-23 NOTE — Progress Notes (Signed)
Gave morphine in hopes of him allowing this nurse to do his dressing change, REFUSED.

## 2022-06-23 NOTE — Consult Note (Signed)
[place] Nurse Consult Note: Reason for Consult:consult received for multiple wounds. Last seen by this writer on 06/02/22.Known also by our department from past three admissions.Seen by Dr. Arnoldo Morale for the back wound on 7/26 and no surgical intervention was indicated. Photos taken on last admission. Enzymatic debriding agent (collagenase/Santyl) was initiated at that time. Thermal injuries have been treated with silvadene (silver sulfadiazine) cream for 1 month.No signs of infection in these areas. Patient is seen by Home Hospice daily at home. Franne Grip, a Wound Treatment Associate [facility]) was the patient's Bedside RN last night. Her assistance with the development of the POC is appreciated. Wound type:Pressure. Pressure Injury POA: Yes/ Measurement:Per Nursing Flow Sheet and below: Back lesion (full thickness):7.5cm x 11cm x 0.4cm with red wound bed (80% and yellow tissue (20%) Coccyx Unstageable pressure injury:2cm x 1cm with depth unable to be determined due to the presence of yellow slough. Left lateral LE: 9cm x 6cm area of partial thickness skin loss Left knee:  1cm round x 0.1cm (red, moist) Left pretibial: 5cm x 1cm x 0.1cm, pink, moist  Wound bed:As described above Drainage (amount, consistency, odor) Small serous to light yellow Periwound: with evidence of previous wound healing (back), mild erythema to coccyx.  Dressing procedure/placement/frequency:I will provide a mattress replacement and bilateral Prevalon pressure redistribution boots for the heels. Turning and repositioning is in place.  I will implement a POC for the application of Medihoney once daily to all wounds after cleansing with NS and patting dry.This will be topped with dry dressings and secured, the legs with Kerlix and paper tape ad the back and coccyx with silicone foam.  [place] nursing team will not follow, but will remain available to this patient, the nursing and medical teams.  Please re-consult if needed.  Thank you for  inviting [place] to participate in this patient's Plan of Care.  Maudie Flakes, MSN, RN, CNS, [place], Serita Grammes, [employer], [employer] phone:  [phone]

## 2022-06-24 NOTE — Progress Notes (Signed)
Patient transferred  via EMS to SNF in stable condition, meds taken without incident, peri and  morning care rendered, external catheter in place O2 continuous @6L  per min, all belongings given to EMS, report given to accepting facility.

## 2022-06-28 MED ORDER — POTASSIUM CHLORIDE 10 MEQ/100ML IV SOLN
INTRAVENOUS | Status: AC
  Filled 2022-06-28: qty 100

## 2022-06-28 MED ORDER — CALCIUM GLUCONATE-NACL 1-0.675 GM/50ML-% IV SOLN
INTRAVENOUS | Status: AC
  Filled 2022-06-28: qty 50

## 2022-07-04 NOTE — Telephone Encounter (Signed)
error 

## 2022-07-28 DEATH — deceased

## 2023-12-01 LAB — MOLECULAR PATHOLOGY
